# Patient Record
Sex: Female | Born: 1965 | Race: White | Hispanic: No | Marital: Married | Smoking: Never smoker
Health system: Southern US, Community
[De-identification: ages and names within clinical notes are randomized; demographics above are authoritative.]

## PROBLEM LIST (undated history)

## (undated) DIAGNOSIS — G43909 Migraine, unspecified, not intractable, without status migrainosus: Secondary | ICD-10-CM

## (undated) DIAGNOSIS — N809 Endometriosis, unspecified: Secondary | ICD-10-CM

## (undated) DIAGNOSIS — Z87898 Personal history of other specified conditions: Secondary | ICD-10-CM

## (undated) DIAGNOSIS — I5020 Unspecified systolic (congestive) heart failure: Secondary | ICD-10-CM

## (undated) DIAGNOSIS — U099 Post covid-19 condition, unspecified: Secondary | ICD-10-CM

## (undated) DIAGNOSIS — N390 Urinary tract infection, site not specified: Secondary | ICD-10-CM

## (undated) DIAGNOSIS — I480 Paroxysmal atrial fibrillation: Secondary | ICD-10-CM

## (undated) DIAGNOSIS — I447 Left bundle-branch block, unspecified: Secondary | ICD-10-CM

## (undated) DIAGNOSIS — E039 Hypothyroidism, unspecified: Secondary | ICD-10-CM

## (undated) DIAGNOSIS — Z9889 Other specified postprocedural states: Secondary | ICD-10-CM

## (undated) DIAGNOSIS — G40309 Generalized idiopathic epilepsy and epileptic syndromes, not intractable, without status epilepticus: Secondary | ICD-10-CM

## (undated) DIAGNOSIS — R112 Nausea with vomiting, unspecified: Secondary | ICD-10-CM

## (undated) DIAGNOSIS — E119 Type 2 diabetes mellitus without complications: Secondary | ICD-10-CM

## (undated) DIAGNOSIS — E669 Obesity, unspecified: Secondary | ICD-10-CM

## (undated) DIAGNOSIS — F419 Anxiety disorder, unspecified: Secondary | ICD-10-CM

## (undated) DIAGNOSIS — K5792 Diverticulitis of intestine, part unspecified, without perforation or abscess without bleeding: Secondary | ICD-10-CM

## (undated) DIAGNOSIS — C50911 Malignant neoplasm of unspecified site of right female breast: Secondary | ICD-10-CM

## (undated) DIAGNOSIS — A419 Sepsis, unspecified organism: Secondary | ICD-10-CM

## (undated) DIAGNOSIS — J449 Chronic obstructive pulmonary disease, unspecified: Secondary | ICD-10-CM

## (undated) DIAGNOSIS — J9600 Acute respiratory failure, unspecified whether with hypoxia or hypercapnia: Secondary | ICD-10-CM

## (undated) DIAGNOSIS — J45909 Unspecified asthma, uncomplicated: Secondary | ICD-10-CM

## (undated) DIAGNOSIS — G4733 Obstructive sleep apnea (adult) (pediatric): Secondary | ICD-10-CM

## (undated) DIAGNOSIS — I428 Other cardiomyopathies: Secondary | ICD-10-CM

## (undated) DIAGNOSIS — Z87442 Personal history of urinary calculi: Secondary | ICD-10-CM

## (undated) DIAGNOSIS — Z8614 Personal history of Methicillin resistant Staphylococcus aureus infection: Secondary | ICD-10-CM

## (undated) DIAGNOSIS — I219 Acute myocardial infarction, unspecified: Secondary | ICD-10-CM

## (undated) DIAGNOSIS — G2581 Restless legs syndrome: Secondary | ICD-10-CM

## (undated) DIAGNOSIS — I10 Essential (primary) hypertension: Secondary | ICD-10-CM

## (undated) DIAGNOSIS — I2510 Atherosclerotic heart disease of native coronary artery without angina pectoris: Secondary | ICD-10-CM

## (undated) DIAGNOSIS — G629 Polyneuropathy, unspecified: Secondary | ICD-10-CM

## (undated) DIAGNOSIS — R9431 Abnormal electrocardiogram [ECG] [EKG]: Secondary | ICD-10-CM

## (undated) DIAGNOSIS — I509 Heart failure, unspecified: Secondary | ICD-10-CM

## (undated) DIAGNOSIS — R569 Unspecified convulsions: Secondary | ICD-10-CM

## (undated) DIAGNOSIS — I82409 Acute embolism and thrombosis of unspecified deep veins of unspecified lower extremity: Secondary | ICD-10-CM

## (undated) DIAGNOSIS — F1190 Opioid use, unspecified, uncomplicated: Secondary | ICD-10-CM

## (undated) DIAGNOSIS — J189 Pneumonia, unspecified organism: Secondary | ICD-10-CM

## (undated) DIAGNOSIS — K219 Gastro-esophageal reflux disease without esophagitis: Secondary | ICD-10-CM

## (undated) DIAGNOSIS — R053 Chronic cough: Secondary | ICD-10-CM

## (undated) DIAGNOSIS — C169 Malignant neoplasm of stomach, unspecified: Secondary | ICD-10-CM

## (undated) DIAGNOSIS — M1990 Unspecified osteoarthritis, unspecified site: Secondary | ICD-10-CM

## (undated) DIAGNOSIS — E611 Iron deficiency: Secondary | ICD-10-CM

## (undated) DIAGNOSIS — I200 Unstable angina: Secondary | ICD-10-CM

## (undated) DIAGNOSIS — I4891 Unspecified atrial fibrillation: Secondary | ICD-10-CM

## (undated) DIAGNOSIS — E876 Hypokalemia: Secondary | ICD-10-CM

## (undated) DIAGNOSIS — C7A8 Other malignant neuroendocrine tumors: Secondary | ICD-10-CM

## (undated) DIAGNOSIS — R197 Diarrhea, unspecified: Secondary | ICD-10-CM

## (undated) DIAGNOSIS — F32A Depression, unspecified: Secondary | ICD-10-CM

## (undated) DIAGNOSIS — K9189 Other postprocedural complications and disorders of digestive system: Secondary | ICD-10-CM

## (undated) DIAGNOSIS — I519 Heart disease, unspecified: Secondary | ICD-10-CM

## (undated) DIAGNOSIS — D3A092 Benign carcinoid tumor of the stomach: Secondary | ICD-10-CM

## (undated) DIAGNOSIS — Z95810 Presence of automatic (implantable) cardiac defibrillator: Secondary | ICD-10-CM

## (undated) DIAGNOSIS — N63 Unspecified lump in unspecified breast: Secondary | ICD-10-CM

## (undated) HISTORY — PX: THYROIDECTOMY: SHX17

## (undated) HISTORY — DX: Benign carcinoid tumor of the stomach: D3A.092

## (undated) HISTORY — PX: ABDOMINAL HYSTERECTOMY: SHX81

## (undated) HISTORY — DX: Heart disease, unspecified: I51.9

## (undated) HISTORY — DX: Diverticulitis of intestine, part unspecified, without perforation or abscess without bleeding: K57.92

## (undated) HISTORY — PX: COLECTOMY: SHX59

## (undated) HISTORY — PX: CHOLECYSTECTOMY: SHX55

## (undated) HISTORY — DX: Endometriosis, unspecified: N80.9

## (undated) HISTORY — PX: OOPHORECTOMY: SHX86

## (undated) HISTORY — PX: CARDIAC DEFIBRILLATOR PLACEMENT: SHX171

## (undated) HISTORY — DX: Unspecified lump in unspecified breast: N63.0

## (undated) HISTORY — DX: Unspecified asthma, uncomplicated: J45.909

## (undated) HISTORY — DX: Personal history of Methicillin resistant Staphylococcus aureus infection: Z86.14

---

## 1988-09-05 DIAGNOSIS — N809 Endometriosis, unspecified: Secondary | ICD-10-CM

## 1988-09-05 HISTORY — DX: Endometriosis, unspecified: N80.9

## 2004-08-18 ENCOUNTER — Other Ambulatory Visit: Payer: Self-pay

## 2004-08-18 ENCOUNTER — Inpatient Hospital Stay: Payer: Self-pay | Admitting: Internal Medicine

## 2005-01-04 ENCOUNTER — Ambulatory Visit: Payer: Self-pay | Admitting: Internal Medicine

## 2005-01-05 ENCOUNTER — Ambulatory Visit: Payer: Self-pay | Admitting: Surgery

## 2005-01-08 ENCOUNTER — Inpatient Hospital Stay: Payer: Self-pay | Admitting: Surgery

## 2005-02-01 ENCOUNTER — Ambulatory Visit: Payer: Self-pay | Admitting: Surgery

## 2005-02-21 ENCOUNTER — Ambulatory Visit: Payer: Self-pay | Admitting: Surgery

## 2005-07-17 ENCOUNTER — Emergency Department: Payer: Self-pay | Admitting: Emergency Medicine

## 2005-11-03 IMAGING — CT CT CHEST W/ CM
1 of 3 series · 15 of 31 positions shown, 19 images · IV contrast (APPLIED)
Comparison: none

REASON FOR EXAM: r/o pe
COMMENTS:

PROCEDURE:     CT  - CT CHEST (FOR PE) W  - August 18, 2004  [DATE]
RESULT:
HISTORY: 38-year-old female with pleuritic chest pain and elevated D-Dimer.
TECHNIQUE: 3 mm axial CT images are obtained from the thoracic inlet through
the upper abdomen during intravenous contrast administration using the
pulmonary embolus protocol.

[Series 7: inspace · axial · 0.66mm/px · z∈[-334,-78]mm · 15 of 411 slices shown, 19 images]
[im 23/411  mediastinal]
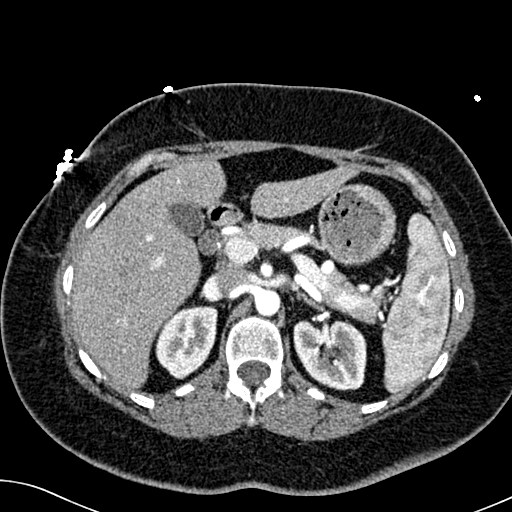
[im 23/411  lung]
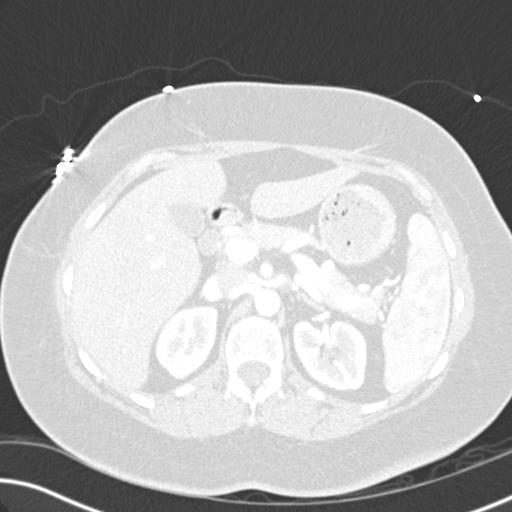
[im 46/411  lung]
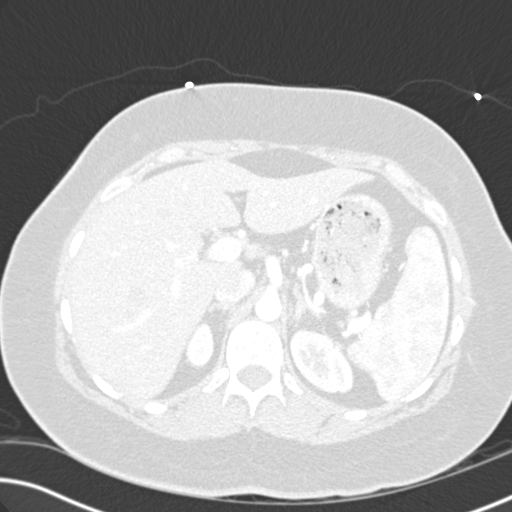
[im 92/411  lung]
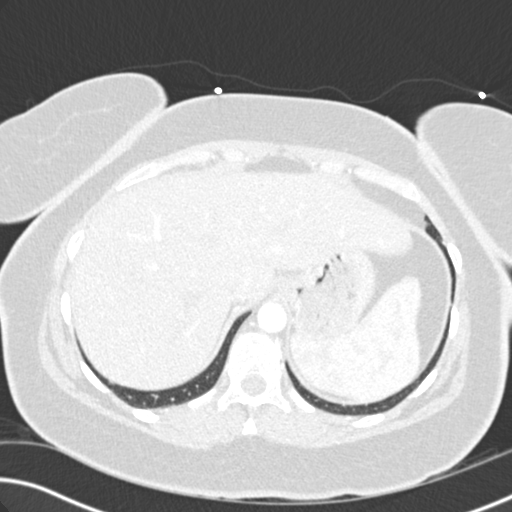
[im 114/411  lung]
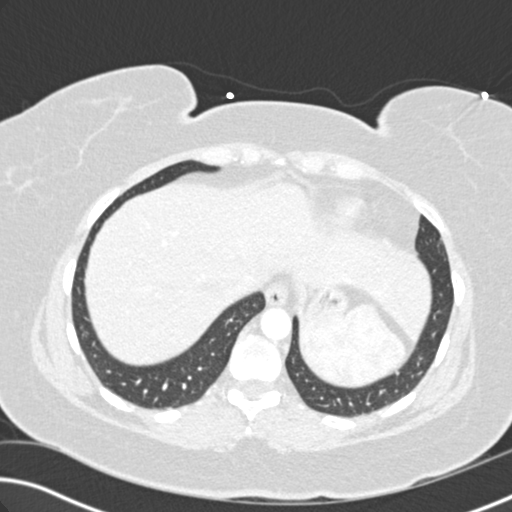
[im 137/411  mediastinal]
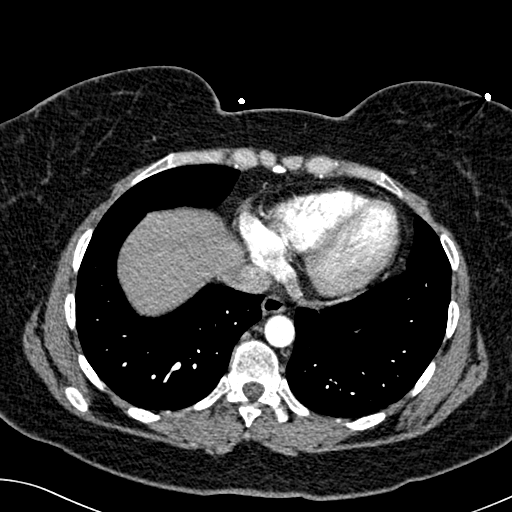
[im 137/411  lung]
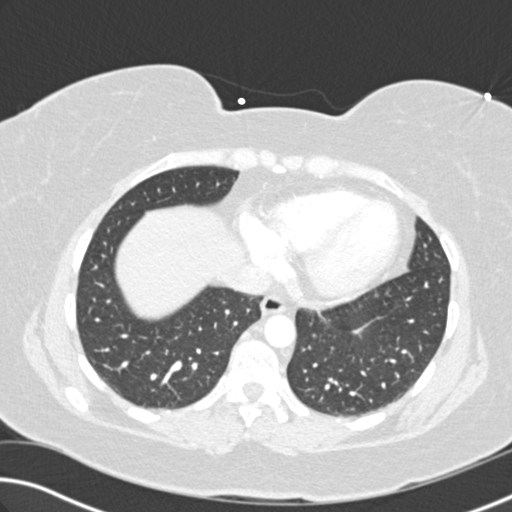
[im 160/411  lung]
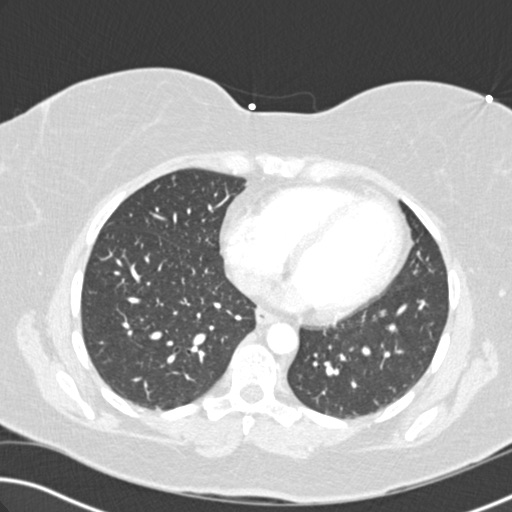
[im 183/411  lung]
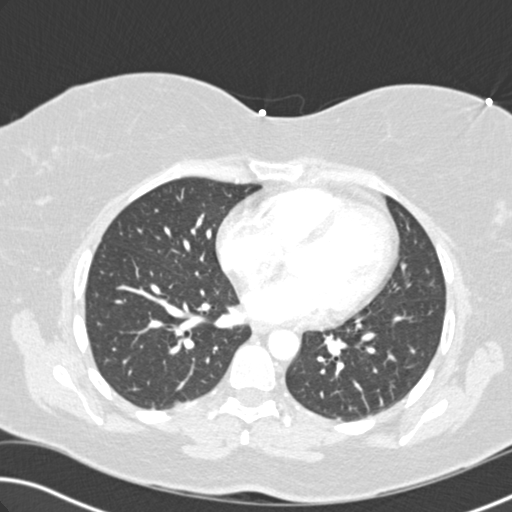
[im 206/411  lung]
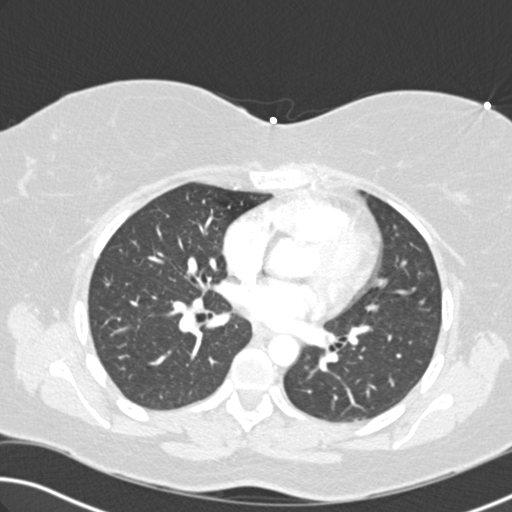
[im 228/411  mediastinal]
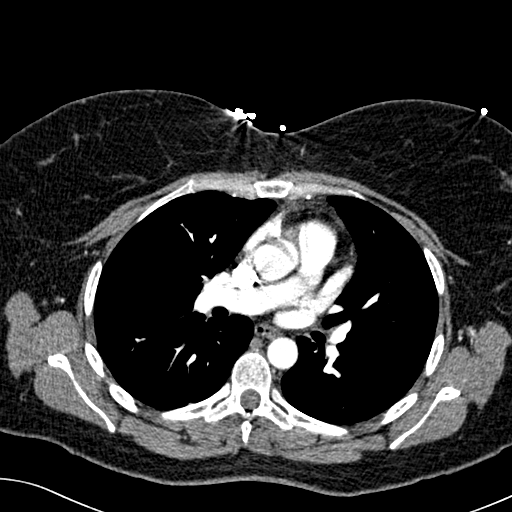
[im 228/411  lung]
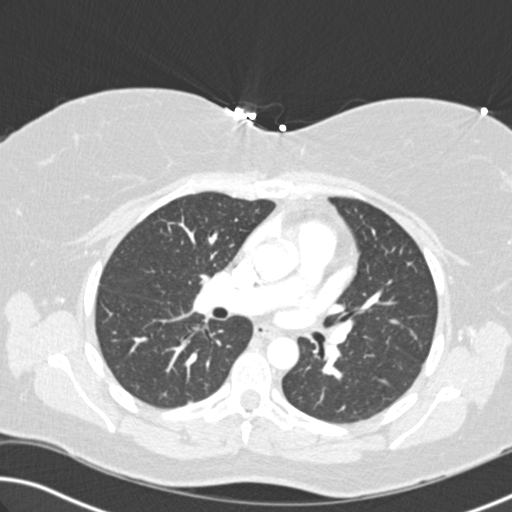
[im 251/411  lung]
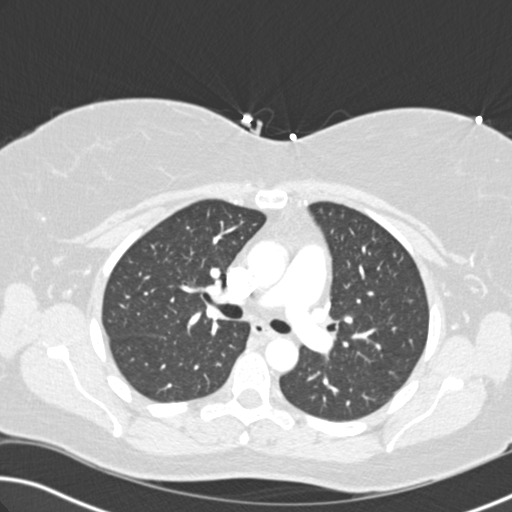
[im 274/411  lung]
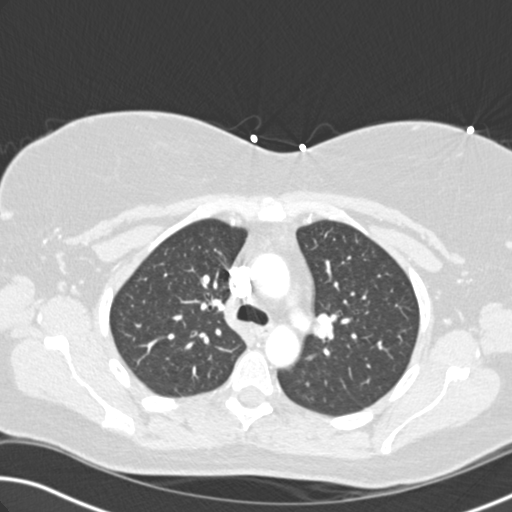
[im 297/411  lung]
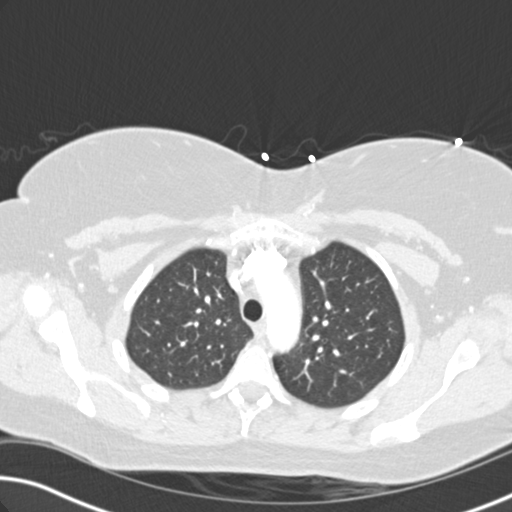
[im 342/411  mediastinal]
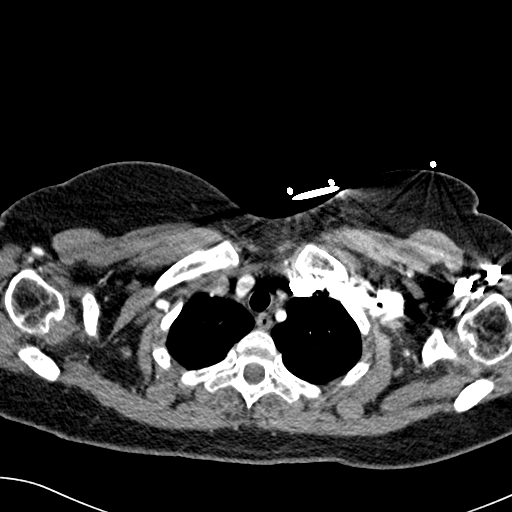
[im 342/411  lung]
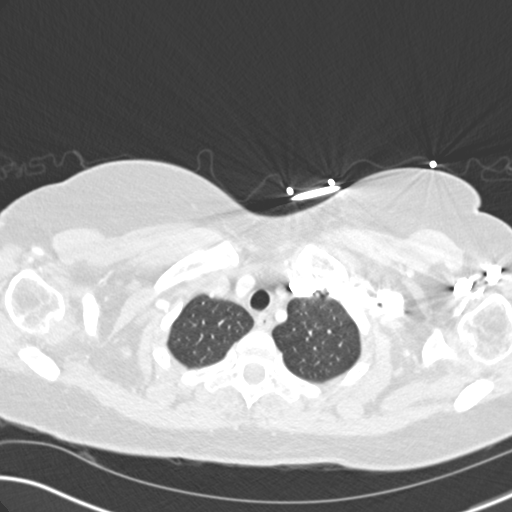
[im 365/411  lung]
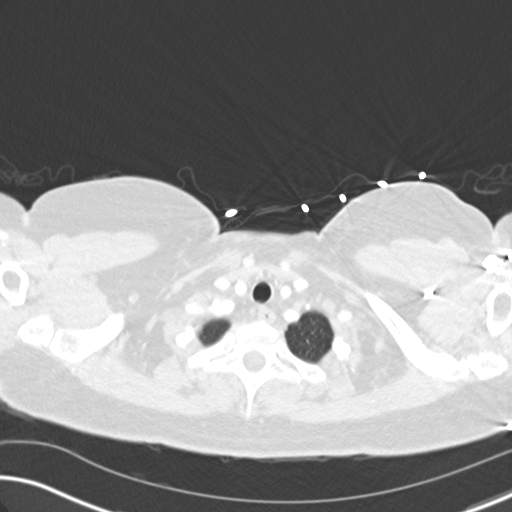
[im 388/411  lung]
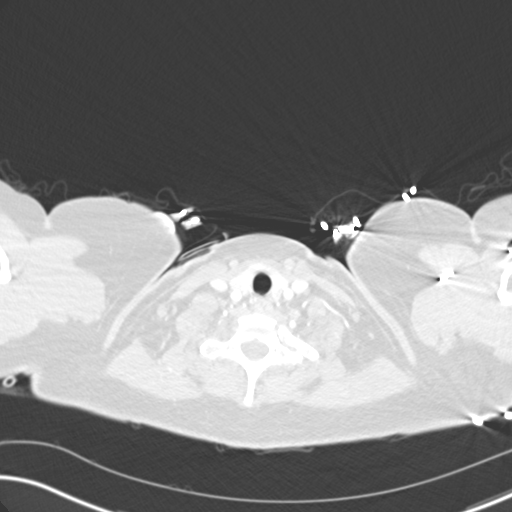

[15 of 31 positions shown; findings below may reference images not displayed]

FINDINGS; There are no prior studies immediately available for comparison.
The pulmonary arteries are well opacified.  No filling defects are seen to
suggest pulmonary embolus.  The heart and mediastinum are within normal
limits. There is no adenopathy. The lungs are clear without consolidation.
The visualized upper abdomen is within normal limits.  The bones are
unremarkable.
IMPRESSION: 1)Unremarkable CT of the Chest.  Specifically, no pulmonary embolus seen.

## 2005-11-03 IMAGING — CT CT HEAD WITHOUT CONTRAST
1 series · 16 of 27 positions shown, 20 images · non-contrast
Comparison: none

REASON FOR EXAM: severe headache
COMMENTS:

PROCEDURE:     CT  - CT HEAD WITHOUT CONTRAST  - August 18, 2004  [DATE]
RESULT:
HISTORY: 38-year-old female with headache.
TECHNIQUE: Contiguous 5 mm axial CT images were obtained from the skull base
to the vertex without intravenous contrast.
FINDINGS; The brain and CSF containing spaces are within normal limits.  No
intracranial hemorrhage or mass effect.  The visualized osseous structures
are unremarkable.

[Series 2: without · axial · non-contrast · 0.41mm/px · z∈[-164,-44]mm · 16 of 27 slices shown, 20 images]
[im 2/27  brain]
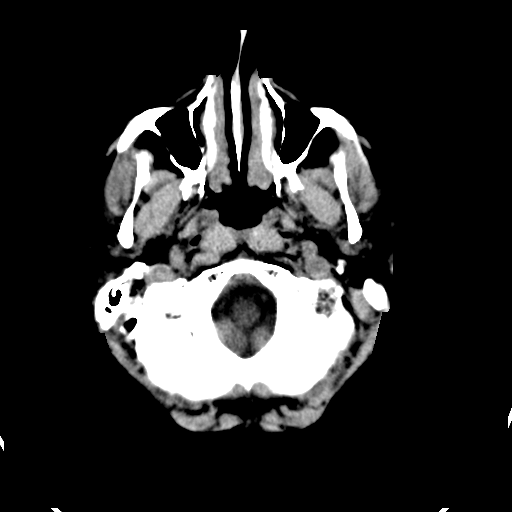
[im 2/27  bone]
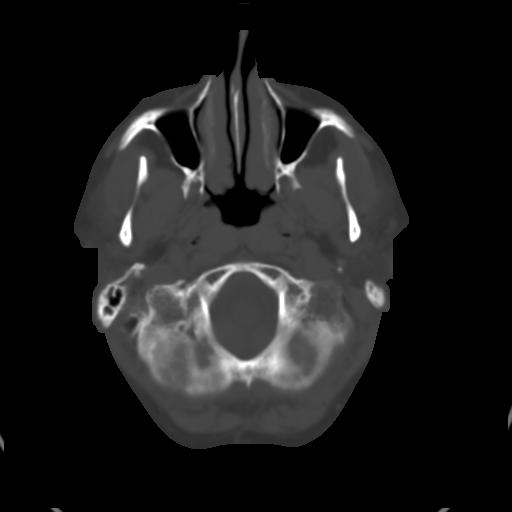
[im 4/27  brain]
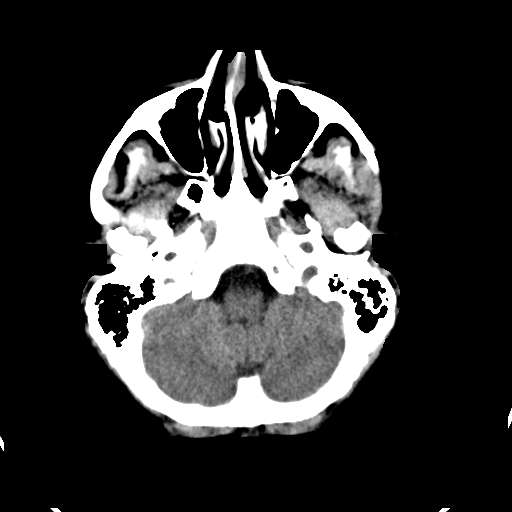
[im 5/27  brain]
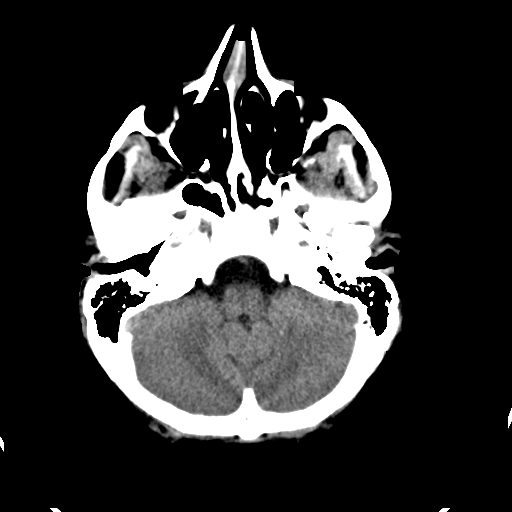
[im 7/27  brain]
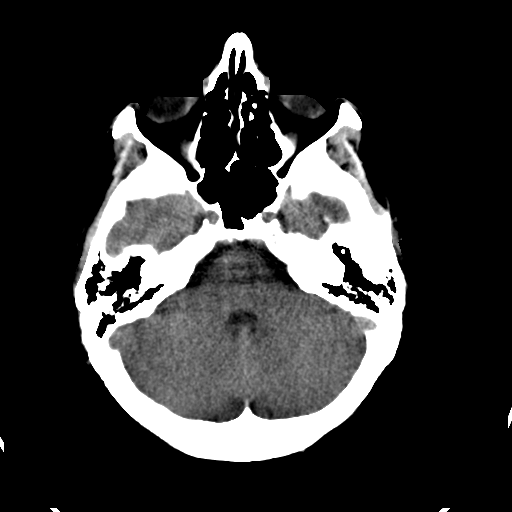
[im 9/27  brain]
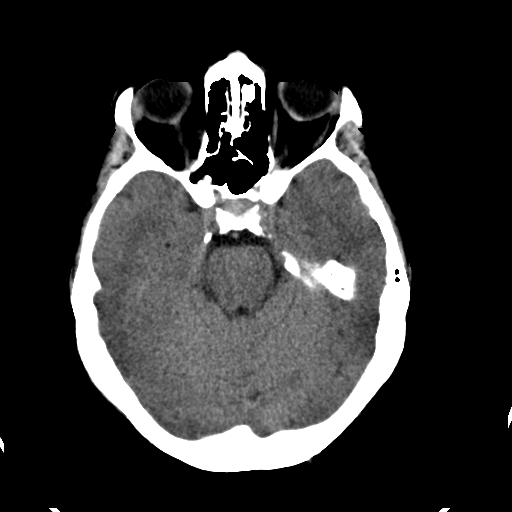
[im 9/27  bone]
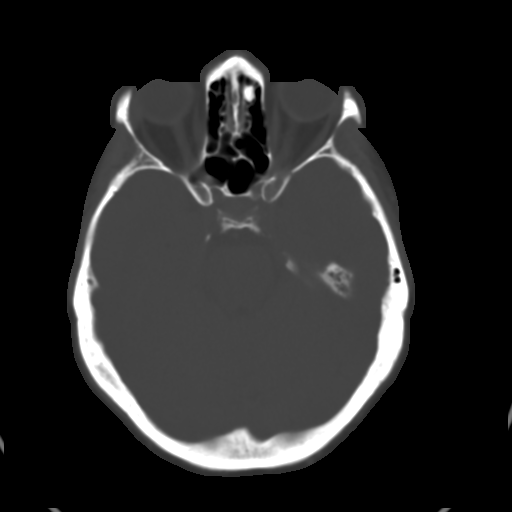
[im 10/27  brain]
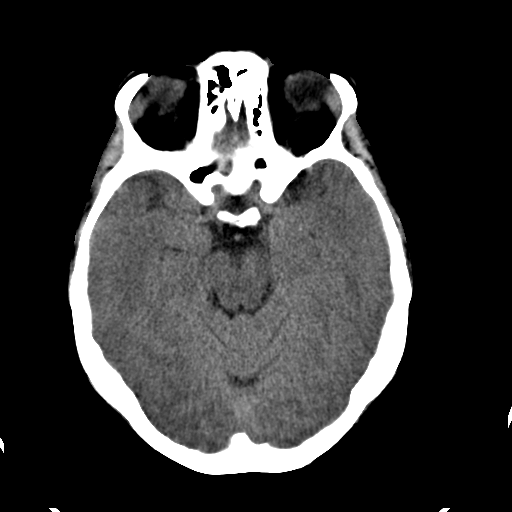
[im 12/27  brain]
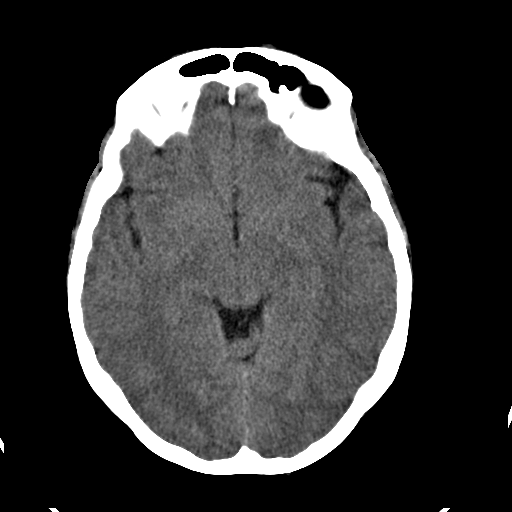
[im 13/27  brain]
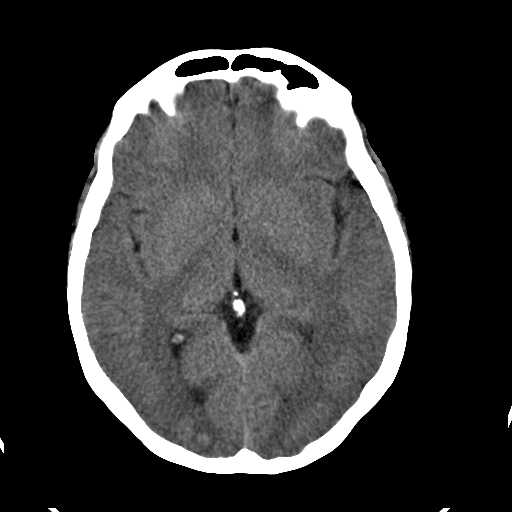
[im 15/27  brain]
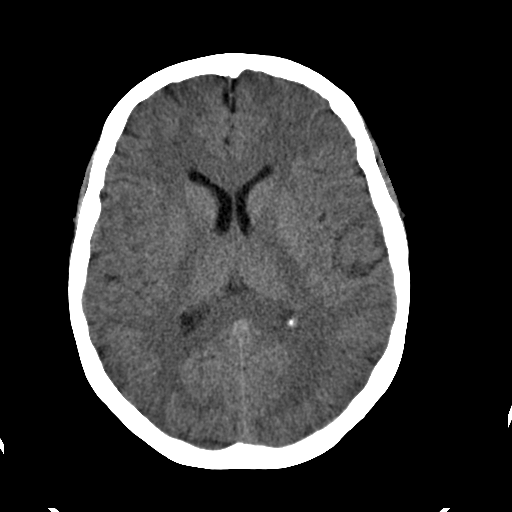
[im 15/27  bone]
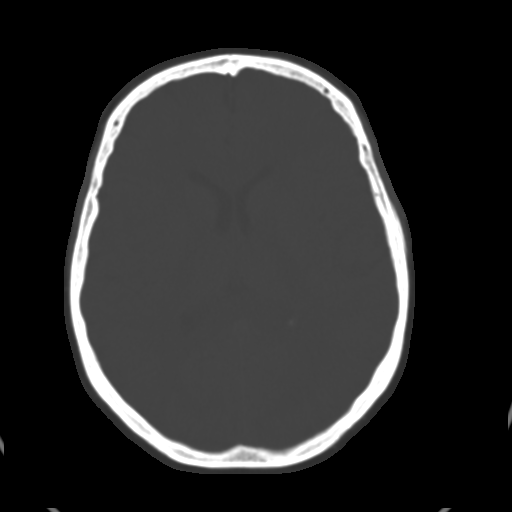
[im 16/27  brain]
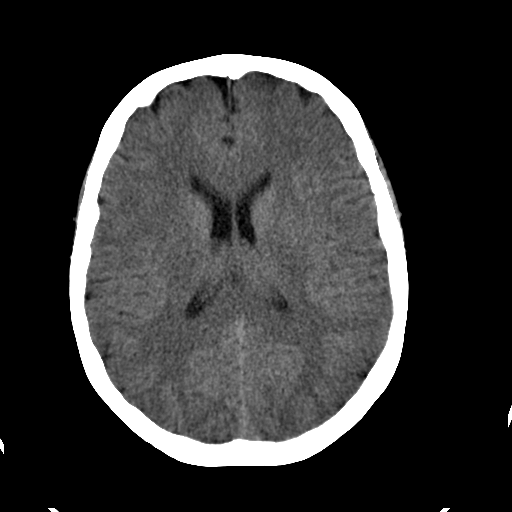
[im 18/27  brain]
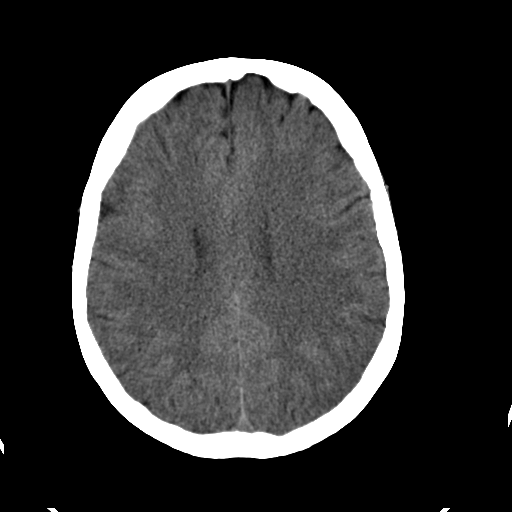
[im 19/27  brain]
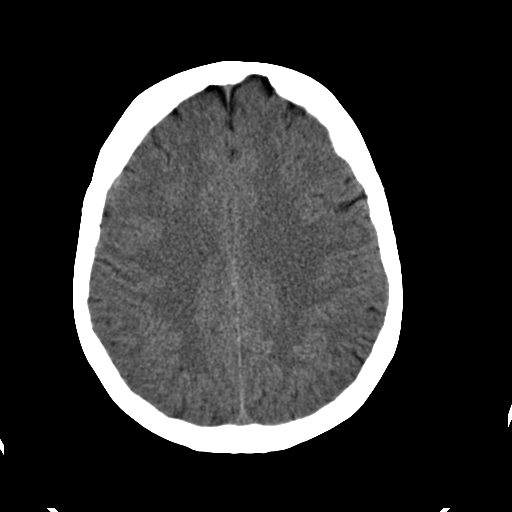
[im 21/27  brain]
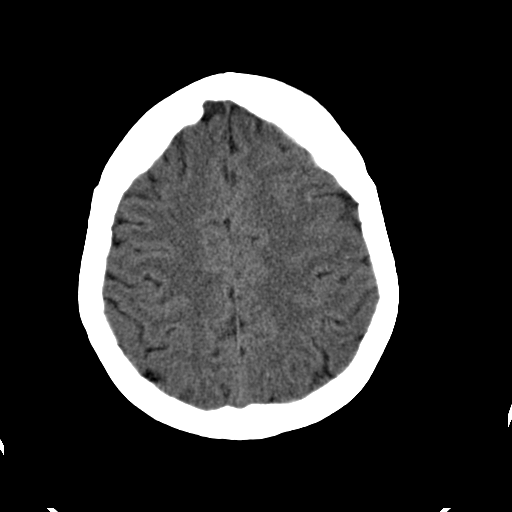
[im 21/27  bone]
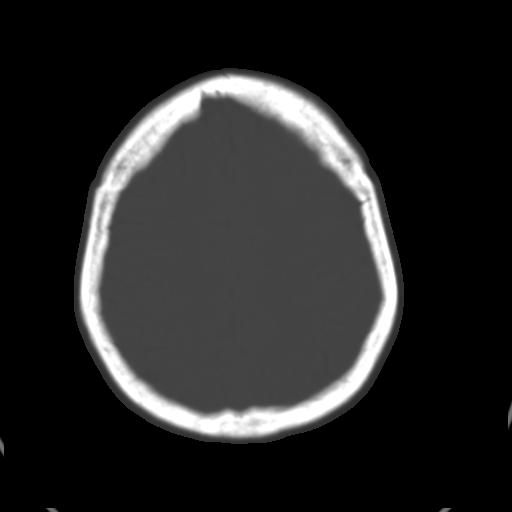
[im 23/27  brain]
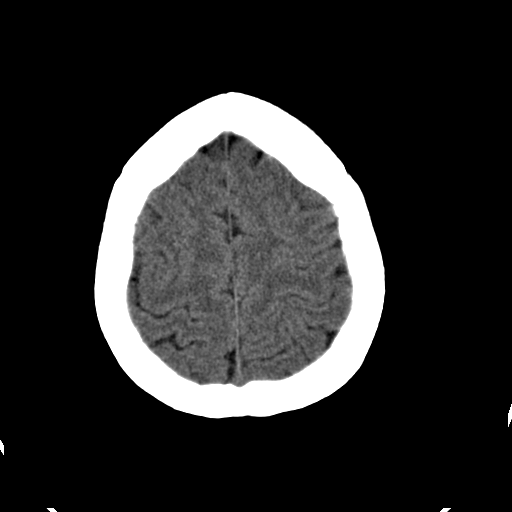
[im 24/27  brain]
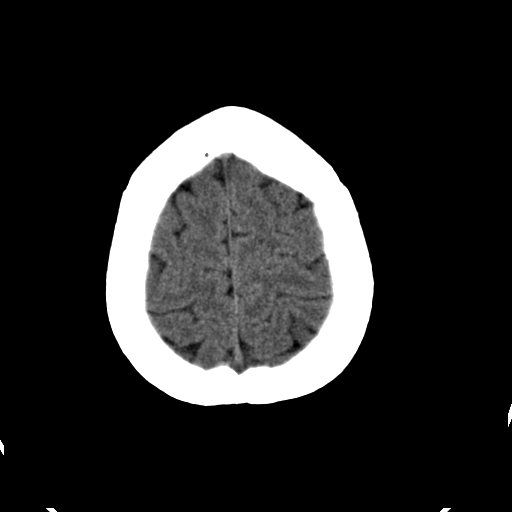
[im 26/27  brain]
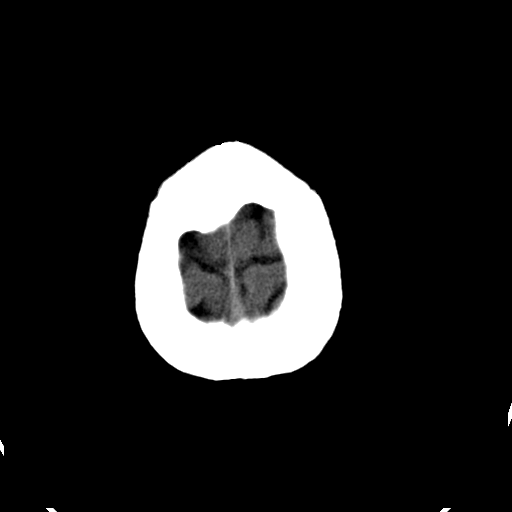

[16 of 27 positions shown; findings below may reference images not displayed]

IMPRESSION: 1)Unremarkable unenhanced Head CT.

## 2006-02-02 ENCOUNTER — Emergency Department: Payer: Self-pay | Admitting: Emergency Medicine

## 2006-03-22 IMAGING — US ABDOMEN ULTRASOUND
1 series · 14 of 25 positions shown · non-contrast
Comparison: none

REASON FOR EXAM: abd pain
COMMENTS:

[Series 1: abdomen ultrasound · 0.33mm/px · 76 acquisitions, 14 frames shown]
[im 1/76]
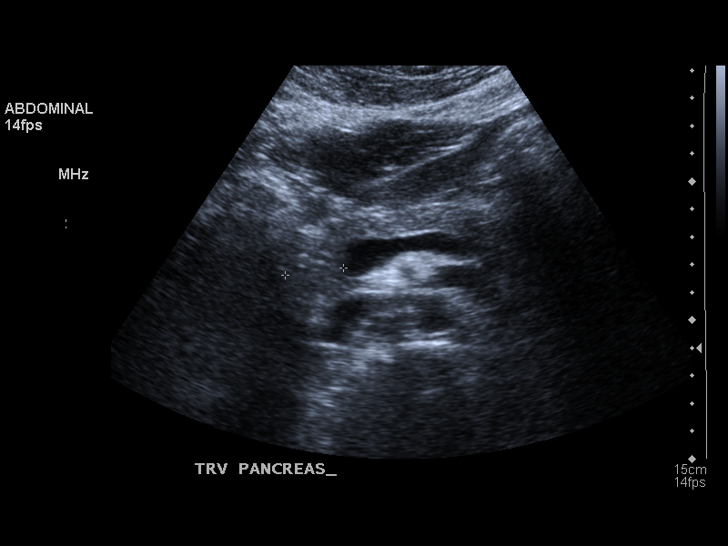
[im 7/76]
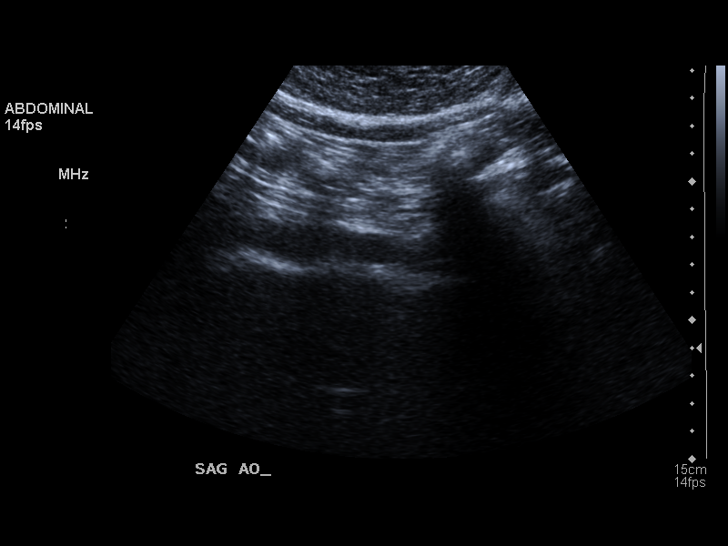
[im 13/76]
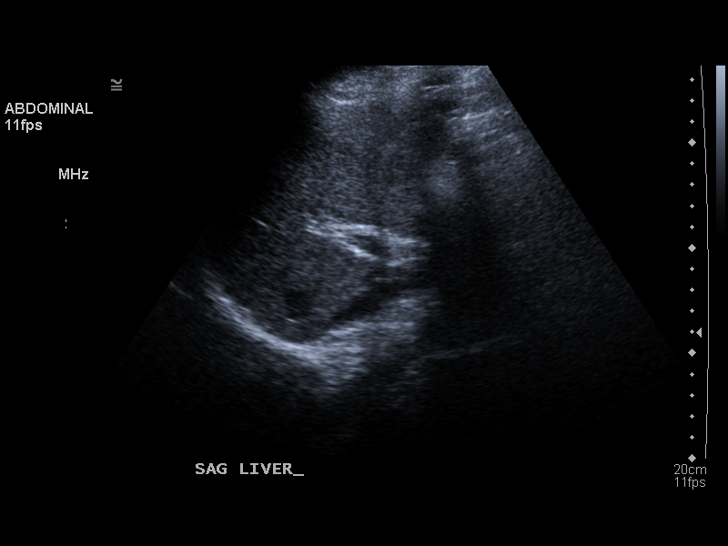
[im 19/76]
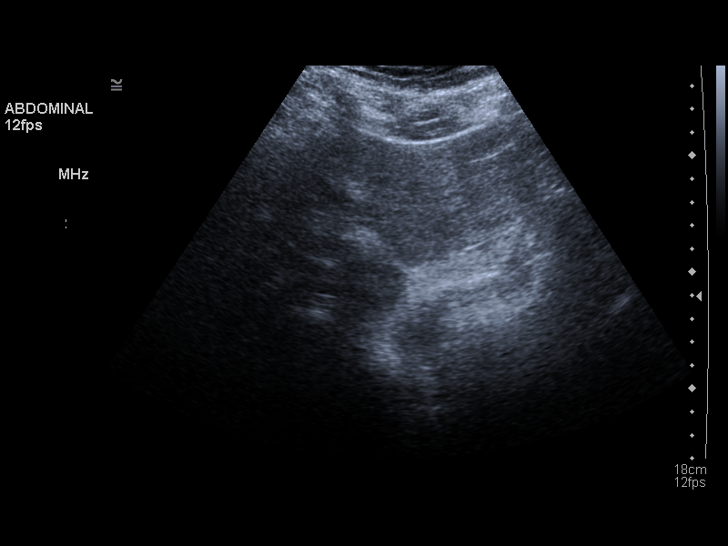
[im 26/76]
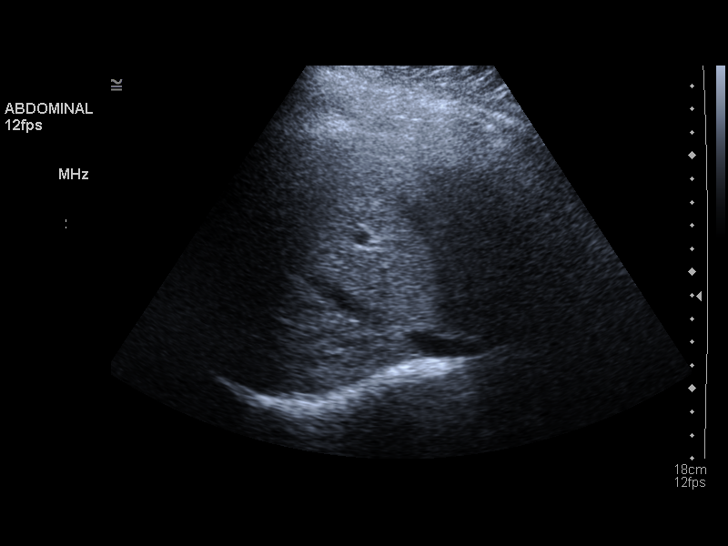
[im 29/76]
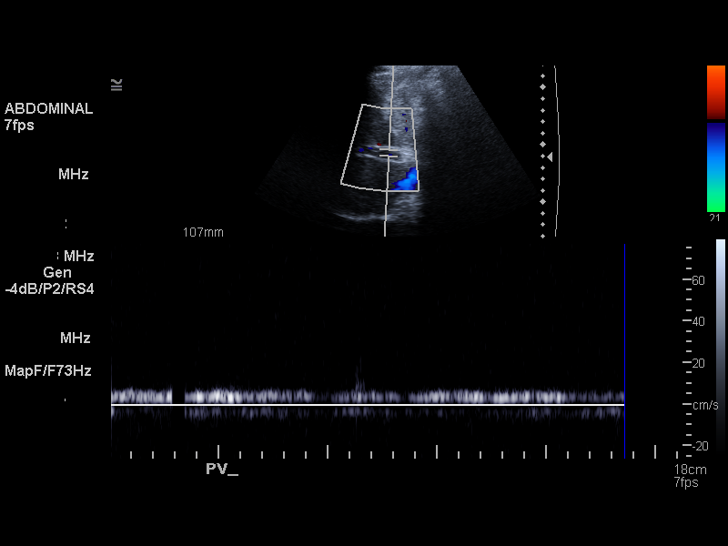
[im 35/76]
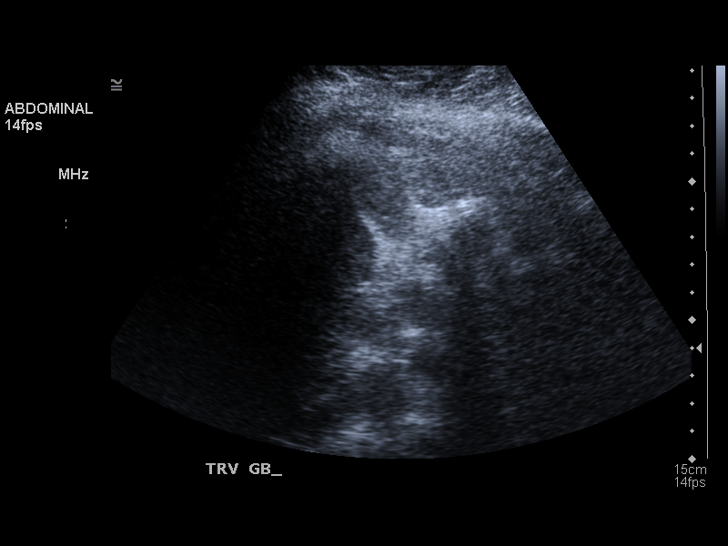
[im 41/76]
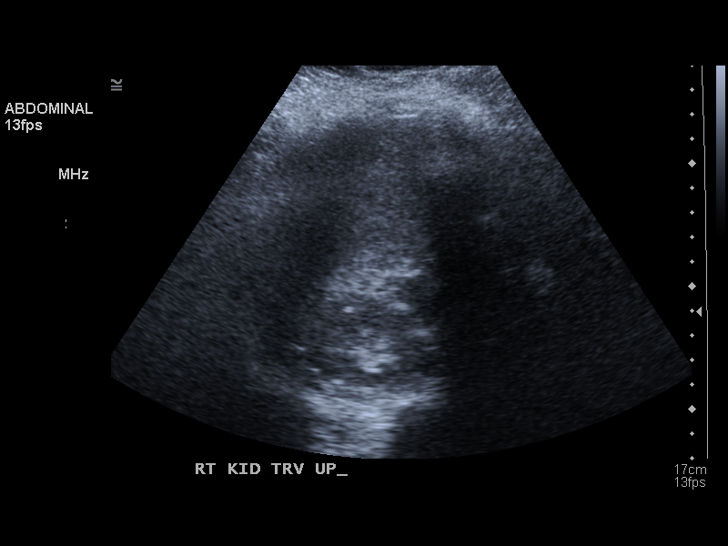
[im 47/76]
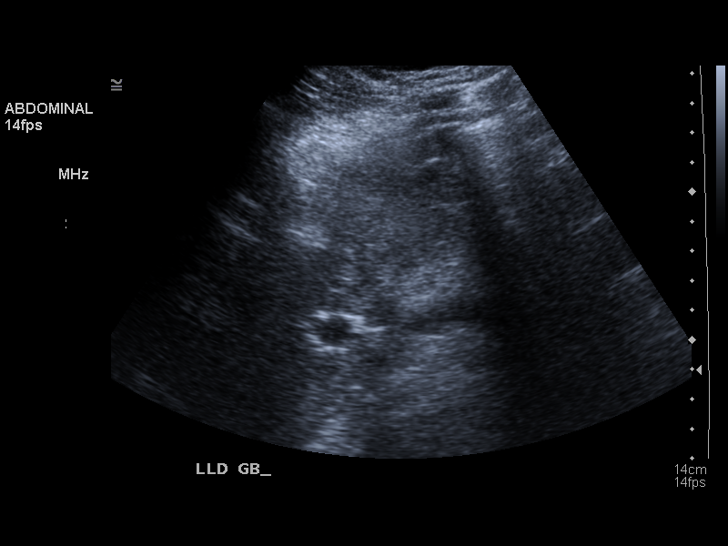
[im 51/76]
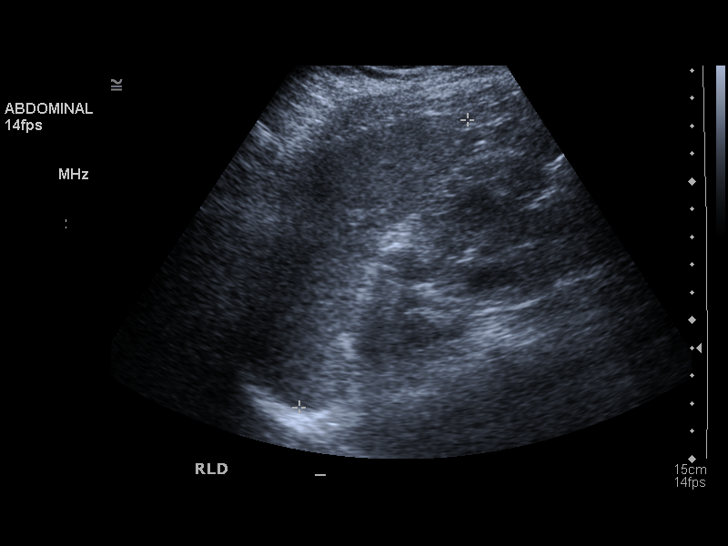
[im 57/76]
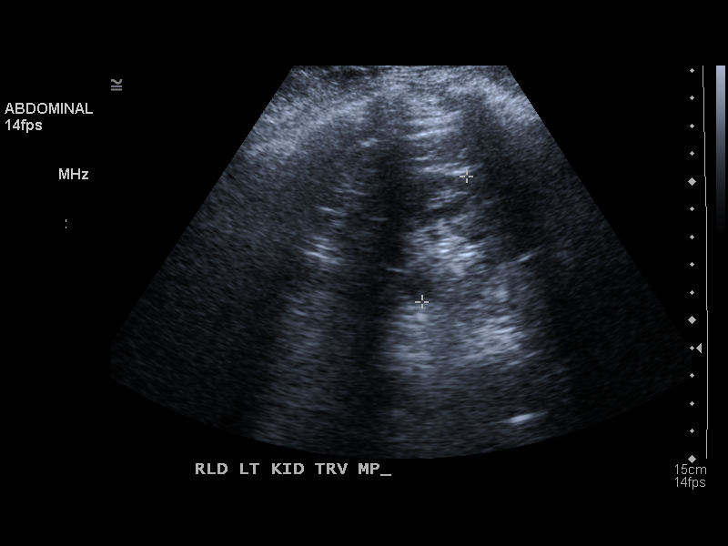
[im 63/76]
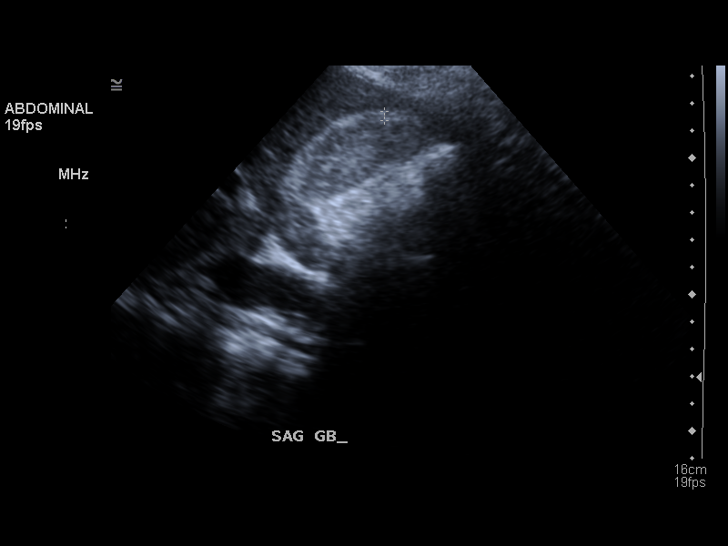
[im 69/76]
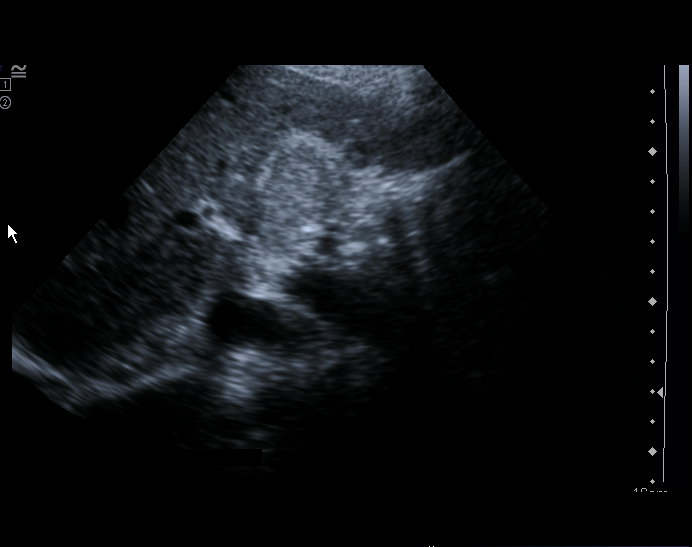
[im 76/76]
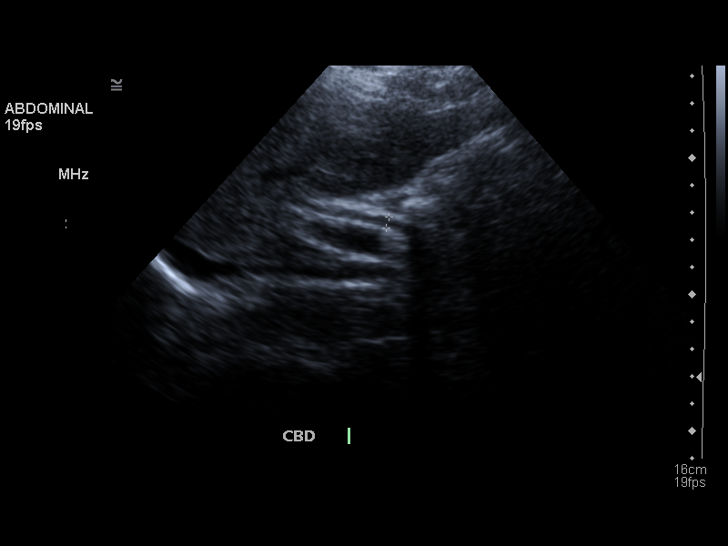

[14 of 25 positions shown; findings below may reference images not displayed]

PROCEDURE:     US  - US ABDOMEN GENERAL SURVEY  - January 04, 2005 [DATE]

RESULT:     The patient is complaining of RIGHT upper quadrant discomfort.

There is a positive sonographic Murphy sign. The gallbladder lumen appears
echogenic with there being multiple tiny echoes present which do appear to
move at times independent of motion in the remainder of the patient's body.
I do not see significant shadowing to suggest gas within the wall of the
gallbladder. The gallbladder wall is mildly thickened at 3.5 mm.  I see no
pericholecystic fluid. There does appear to be a stone in the gallbladder
neck.  The common bile duct measures 3.5 mm in diameter.  The liver,
pancreas, spleen and kidneys are normal in appearance. Survey views of the
abdominal aorta are normal and there is no evidence of ascites.
IMPRESSION: 1)There are findings consistent with acute cholecystitis and acute
cholelithiasis.  Echogenic bile/sludge is present and there does appear to
be a stone in the gallbladder neck. A positive sonographic Murphy sign is
demonstrated. There is gallbladder wall thickening.

The findings were called to Dr. Llach at the conclusion of the study.

## 2006-03-26 IMAGING — CR DG ABDOMEN 3V
1 series · 4 of 4 positions shown · non-contrast
Comparison: none

REASON FOR EXAM: s/p gallbladder surgery pain
COMMENTS:  LMP: Post Hysterectomy

[Series 1: view not recorded · 0.17mm/px · 4 of 4 slices shown]
[im 1/4]
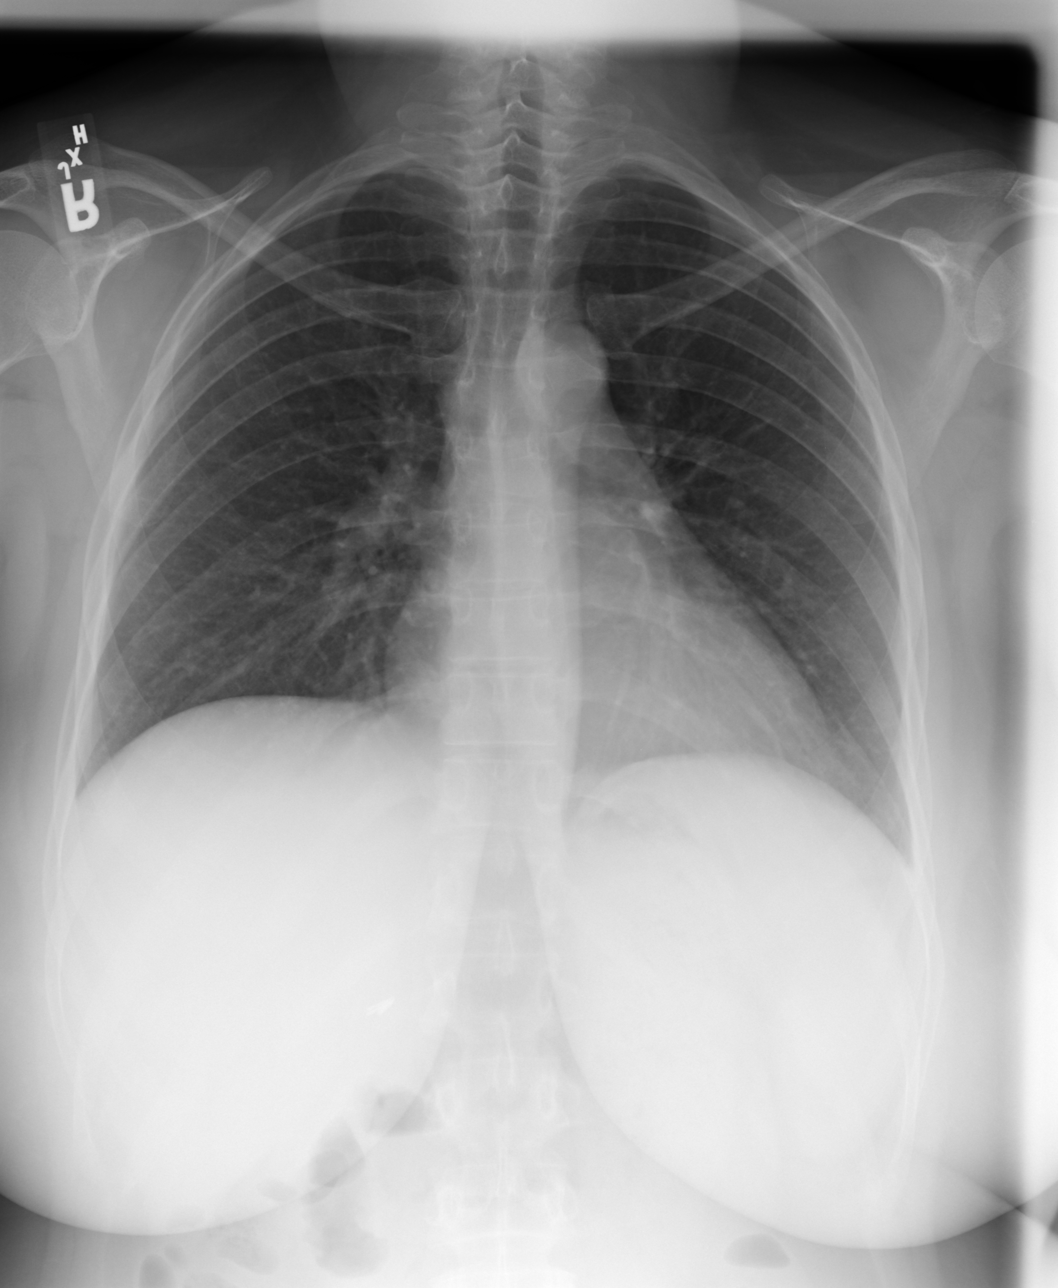
[im 2/4]
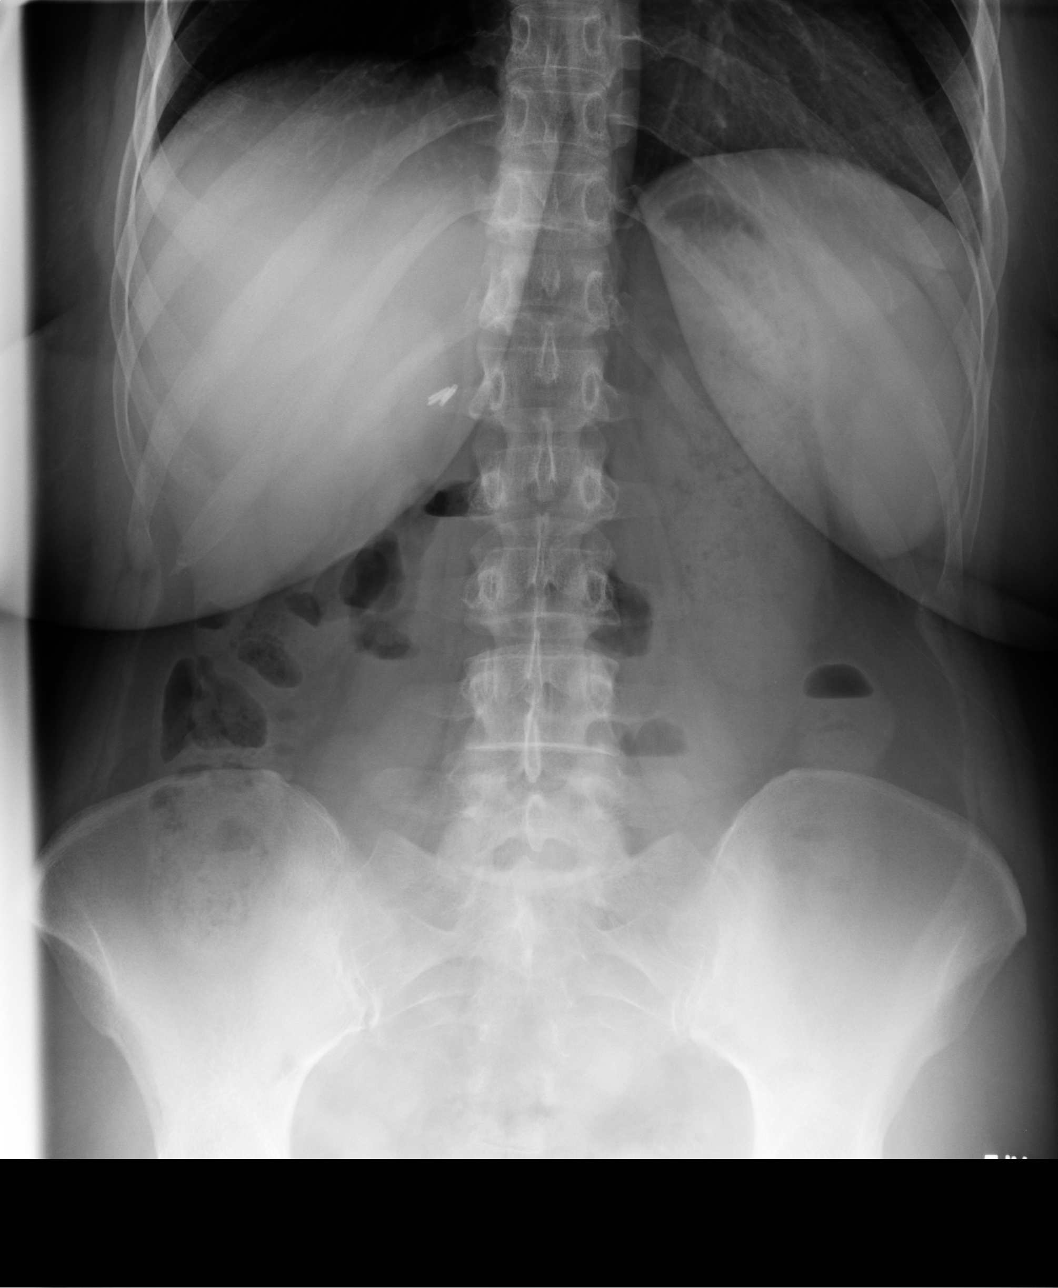
[im 3/4]
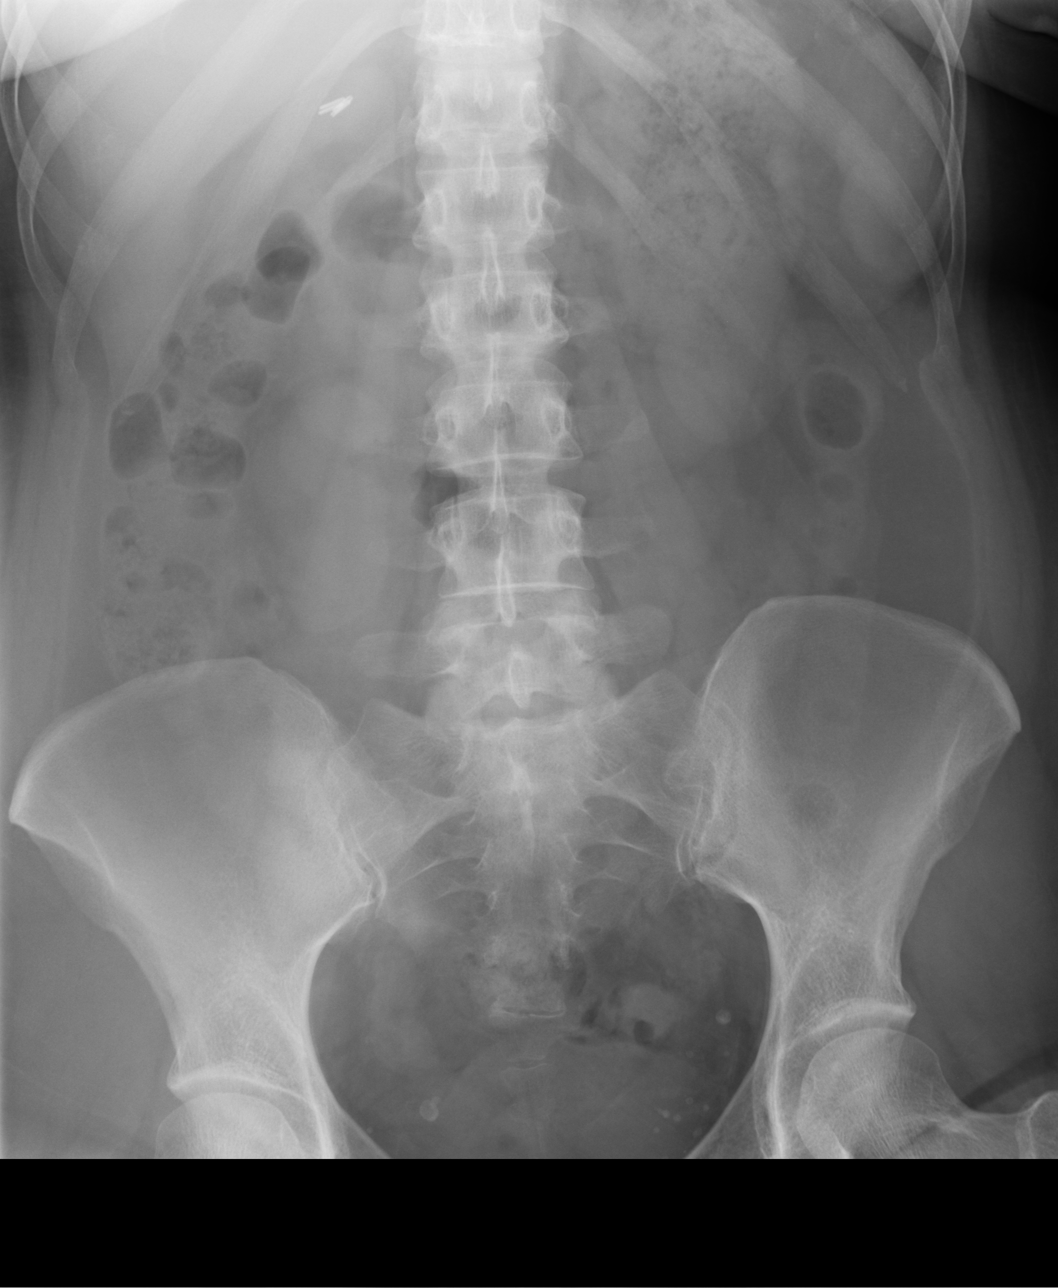
[im 4/4]
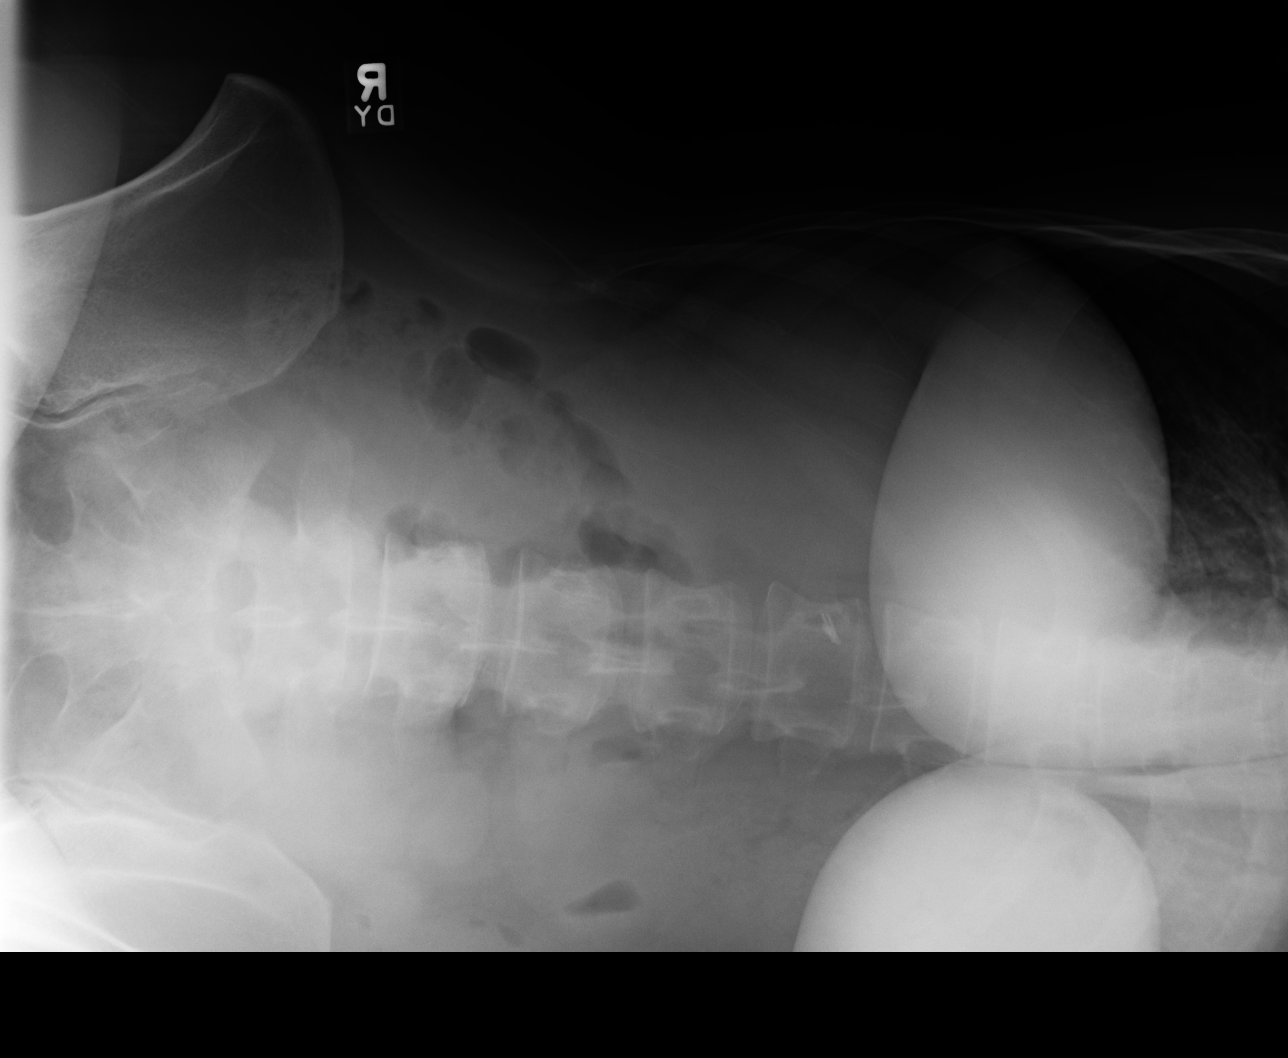

[4 of 4 positions shown; findings below may reference images not displayed]

PROCEDURE:     DXR - DXR ABDOMEN 3-WAY (INCL PA CXR)  - January 08, 2005  [DATE]

RESULT:       This study was compared to a previous study dated 08/18/04.

A single frontal view of the chest demonstrates shallow inspiration without
evidence of focal infiltrates, effusions or edema.

Air is seen within nondilated loops of large and small bowel.  A moderate to
large amount of stool is appreciated throughout the colon.  Phleboliths are
demonstrated within the pelvis.
IMPRESSION: 1.     Nonobstructive bowel gas pattern with a large amount of stool.
2.     Chest radiograph without evidence of acute cardiopulmonary disease.

## 2006-03-26 IMAGING — CT CT ABD-PELV W/ CM
1 of 2 series · 15 of 32 positions shown, 19 images · non-contrast
Comparison: none

REASON FOR EXAM: Severe abdominal pain for three days, status post lap
cholecystectomy
COMMENTS:

[Series 2: abdomen · axial · 0.67mm/px · z∈[-434,-20]mm · 15 of 91 slices shown, 19 images]
[im 4/91  soft-tissue]
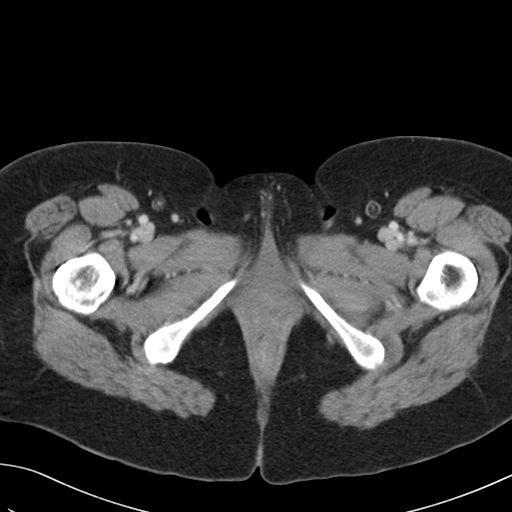
[im 4/91  bone]
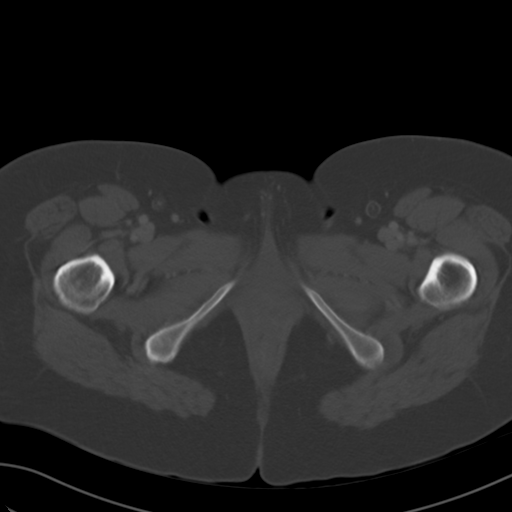
[im 12/91  soft-tissue]
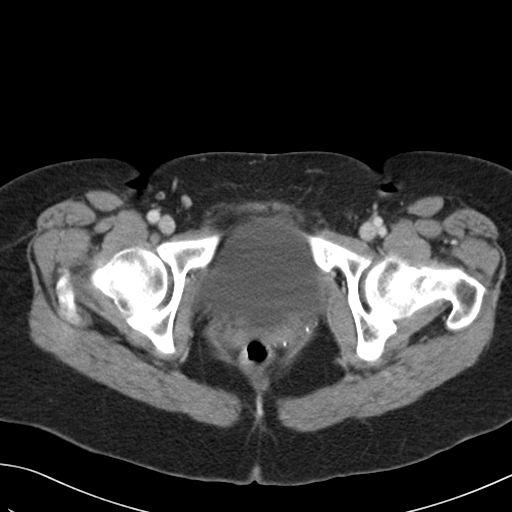
[im 19/91  soft-tissue]
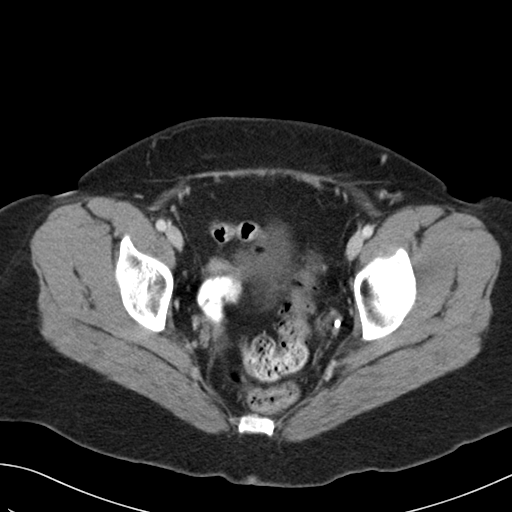
[im 27/91  soft-tissue]
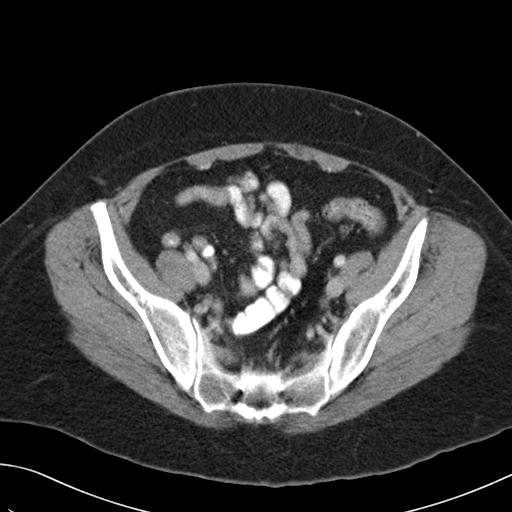
[im 31/91  soft-tissue]
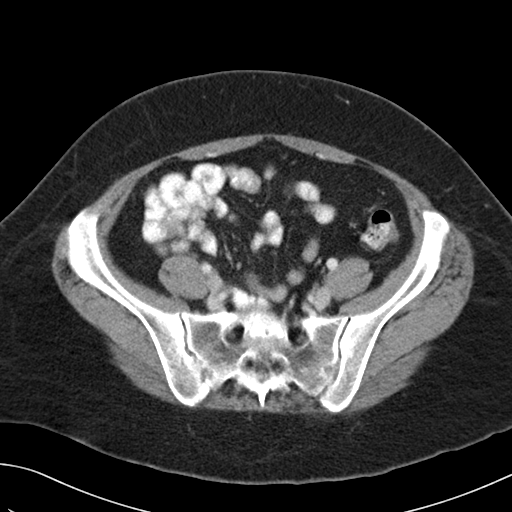
[im 38/91  soft-tissue]
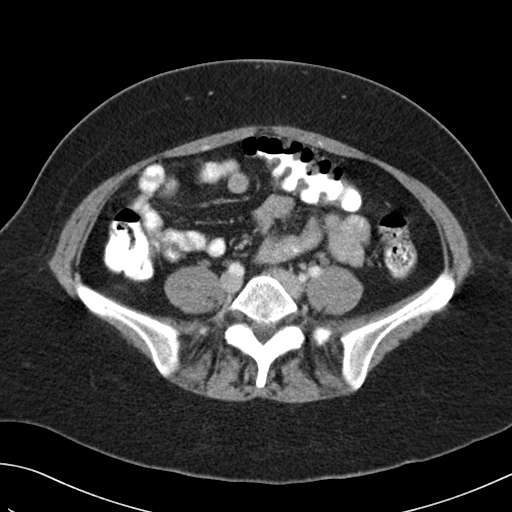
[im 46/91  soft-tissue]
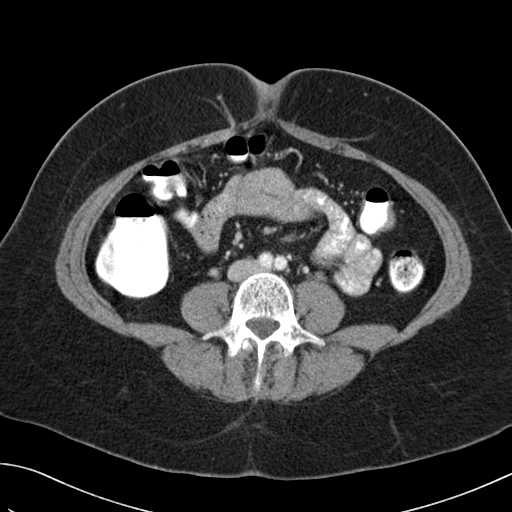
[im 53/91  soft-tissue]
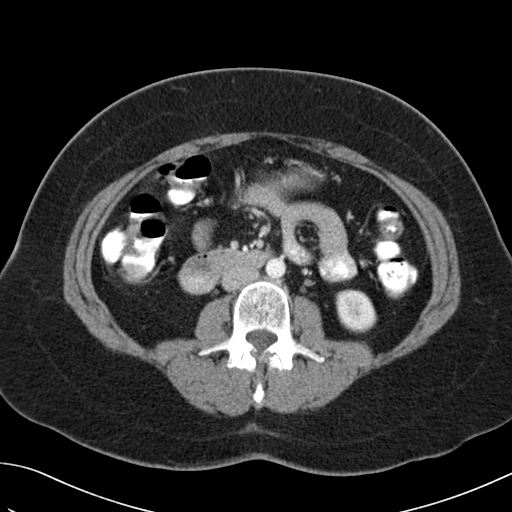
[im 61/91  soft-tissue]
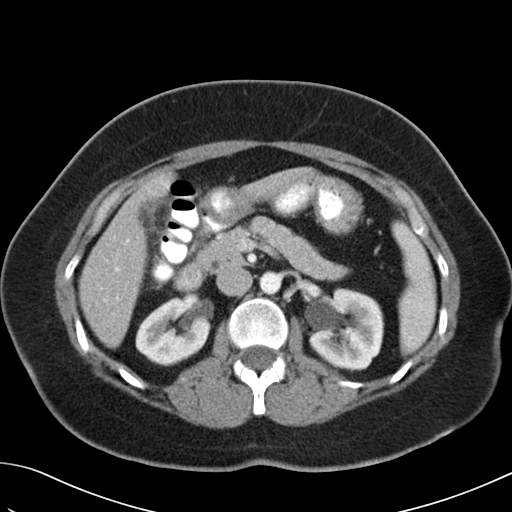
[im 61/91  bone]
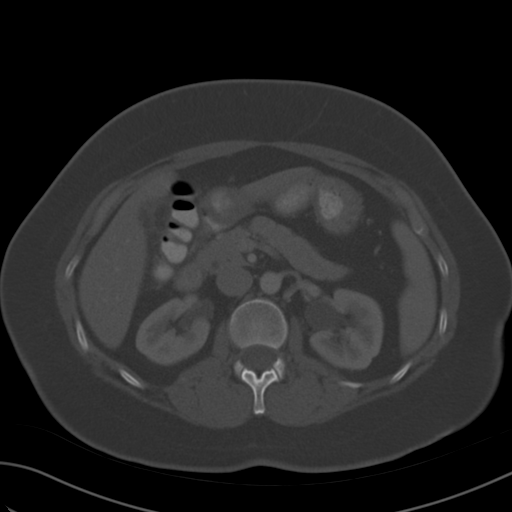
[im 64/91  soft-tissue]
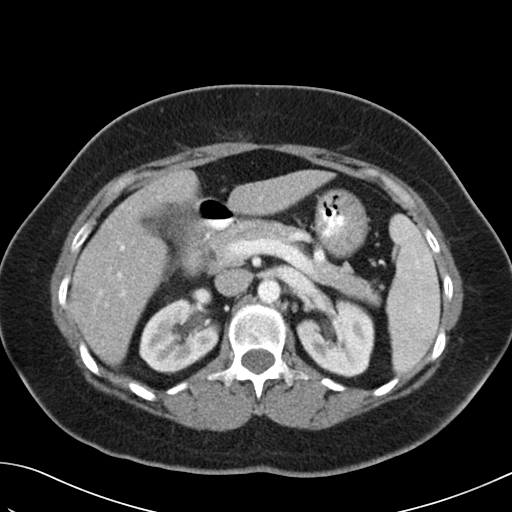
[im 72/91  soft-tissue]
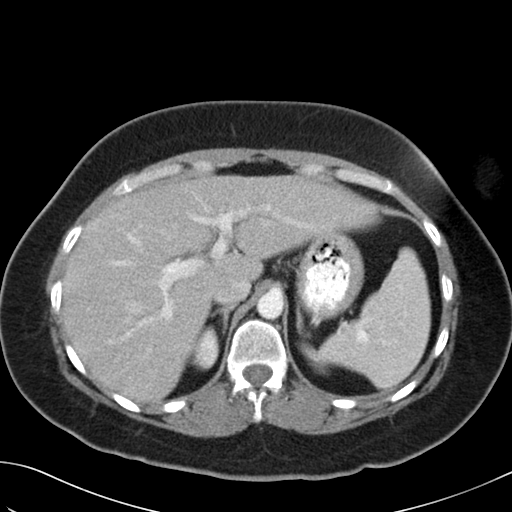
[im 76/91  lung]
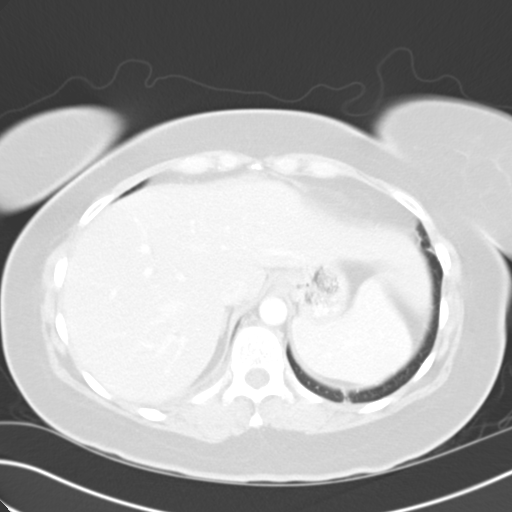
[im 79/91  soft-tissue]
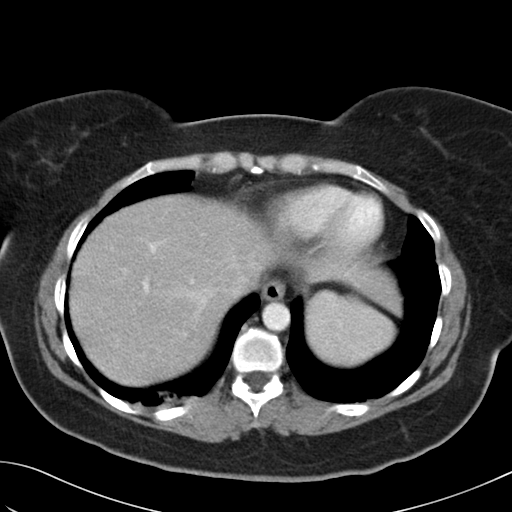
[im 79/91  lung]
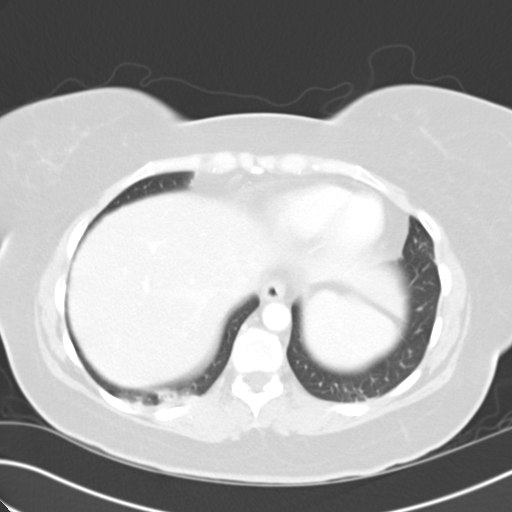
[im 83/91  lung]
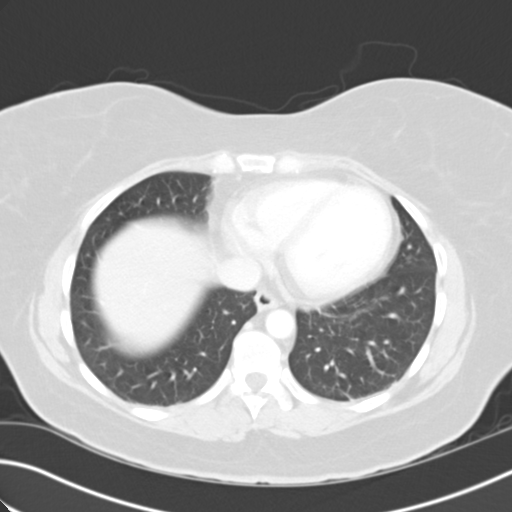
[im 87/91  soft-tissue]
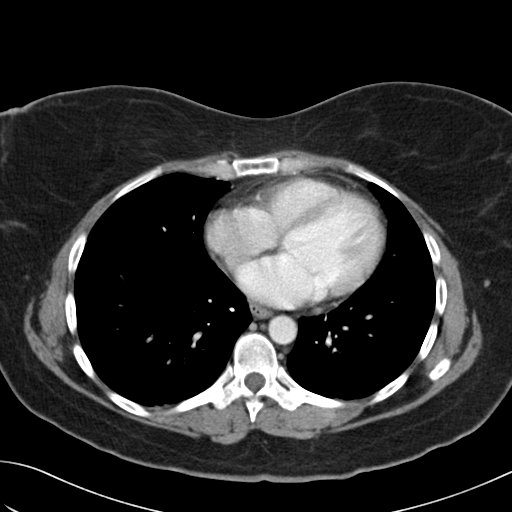
[im 87/91  lung]
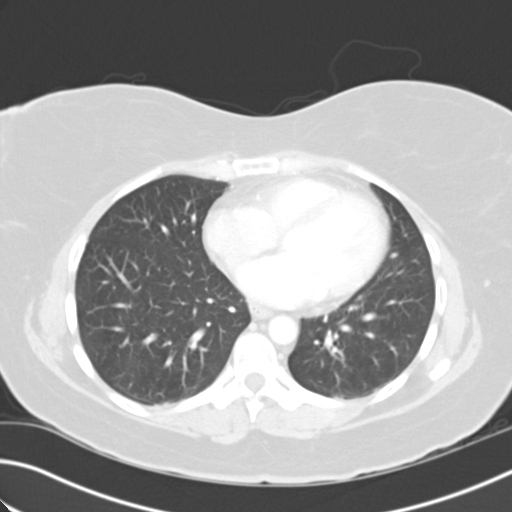

[15 of 32 positions shown; findings below may reference images not displayed]

PROCEDURE:     CT  - CT ABDOMEN / PELVIS  W  - January 08, 2005  [DATE]

RESULT:     Spiral 5.0 mm sections were obtained from the lung bases through
the pubic symphysis status post intravenous administration of 100 cc's of
Xsovue-FOX.

Evaluation of the liver demonstrates no evidence of liver masses or regions
of abnormal enhancement. Findings consistent with the patient's recent
cholecystectomy are appreciated evident by surgical clips in the gallbladder
fossa as well as a small amount of fluid and stranding indicative of mild
inflammatory change within the fat within the gallbladder fossa. The spleen,
adrenals and pancreas are unremarkable. Evaluation of the RIGHT and LEFT
kidneys demonstrates what appears to be extrarenal pelves, LEFT greater than
RIGHT. No evidence of abdominal or pelvic drainable loculated fluid or
further regions of fluid free from masses or adenopathy is appreciated.
Diverticulosis is demonstrated within the sigmoid colon which appears to be
moderate to severe. The celiac, SMA, SMV, portal vein and IMA are patent.

Evaluation of the lung bases demonstrates minimal hypoventilation within the
lung bases.
IMPRESSION: 1.     Post-surgical changes in the region of the gallbladder fossa without
evidence of drainable loculated fluid collections or significant amount of
free fluid.
2.     No further intra-abdominal abnormalities are appreciated.
3.     Diverticulosis within the sigmoid colon.
4.     These findings were discussed with Dr. Kagiso Vincent of the Surgery Service
at the time of the initial interpretation.

## 2006-05-09 IMAGING — CT CT ABDOMEN W/ CM
1 of 2 series · 15 of 32 positions shown, 19 images · non-contrast
Comparison: none

REASON FOR EXAM: abd wall mass
COMMENTS:

[Series 2: abdomen · axial · 0.63mm/px · z∈[-552,-292]mm · 15 of 58 slices shown, 19 images]
[im 3/58  soft-tissue]
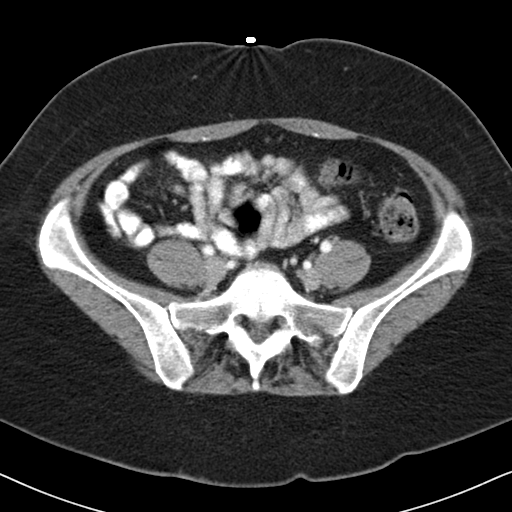
[im 3/58  bone]
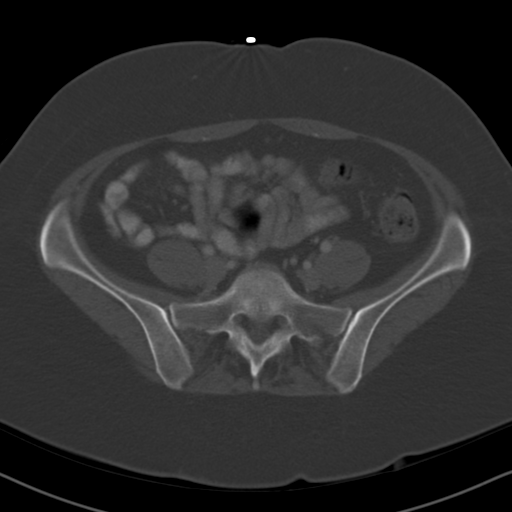
[im 7/58  soft-tissue]
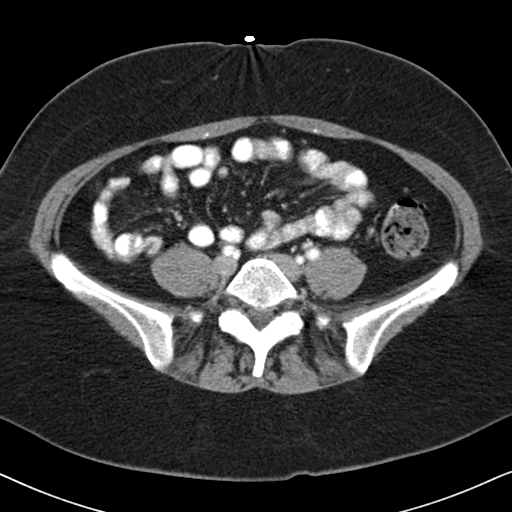
[im 11/58  soft-tissue]
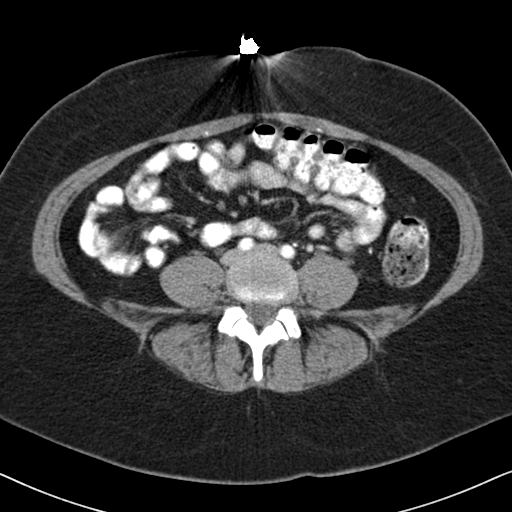
[im 16/58  soft-tissue]
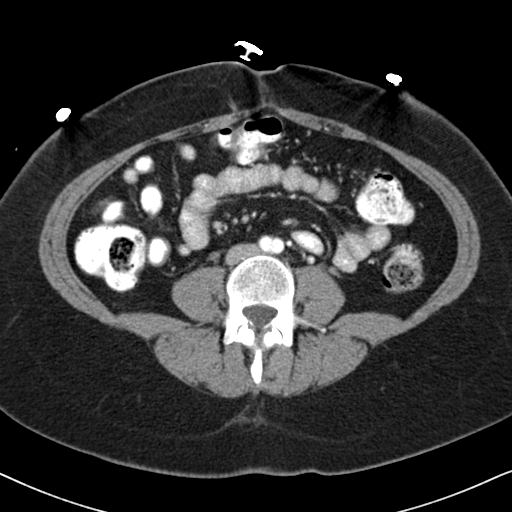
[im 20/58  soft-tissue]
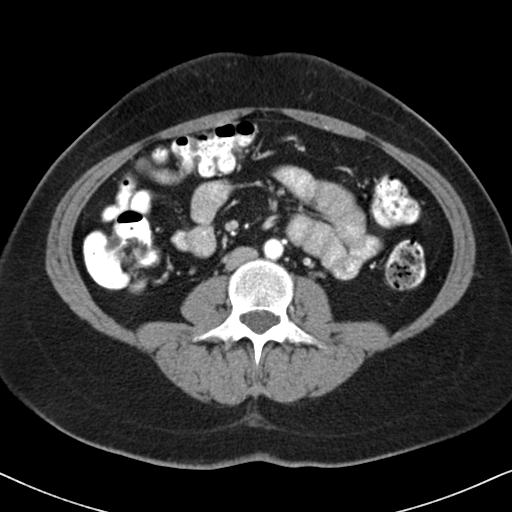
[im 25/58  soft-tissue]
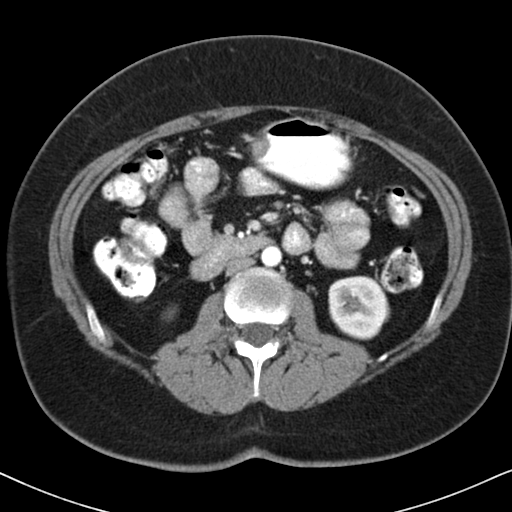
[im 29/58  soft-tissue]
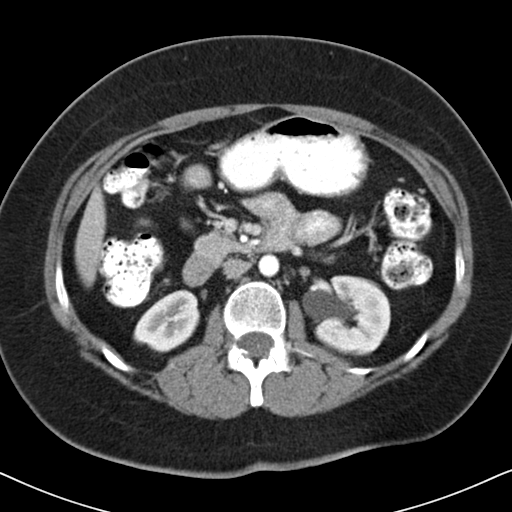
[im 33/58  soft-tissue]
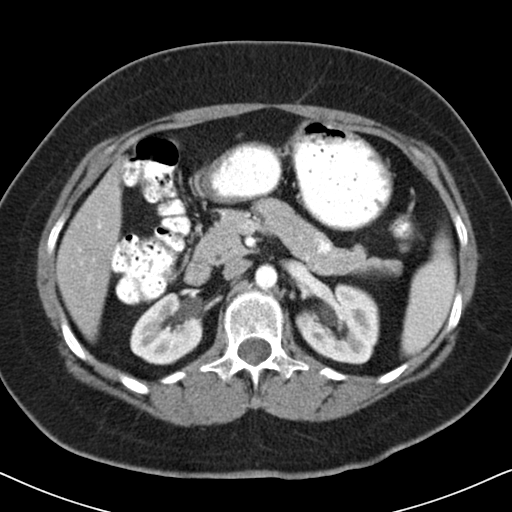
[im 38/58  soft-tissue]
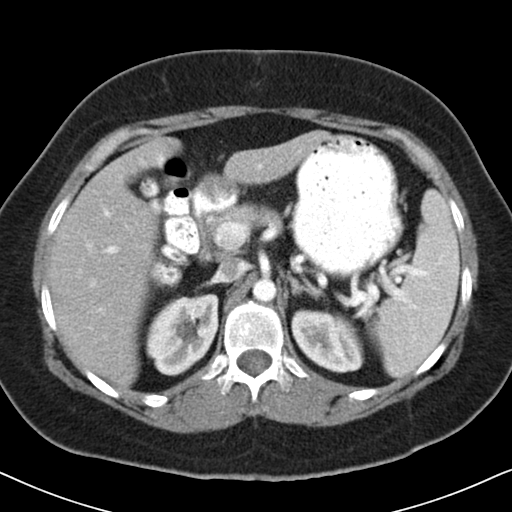
[im 38/58  bone]
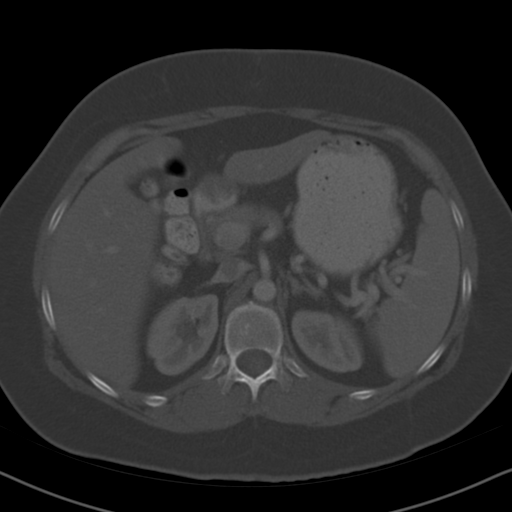
[im 42/58  soft-tissue]
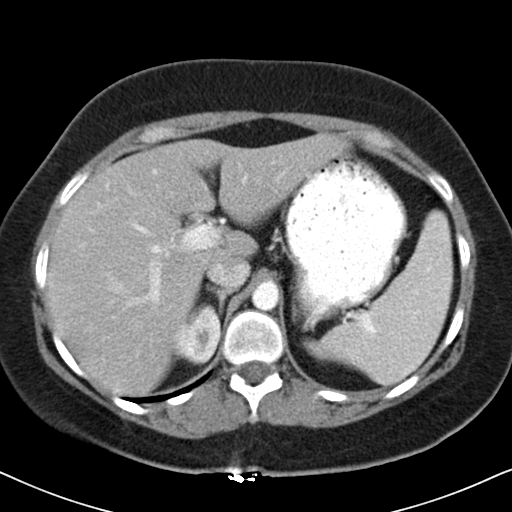
[im 47/58  soft-tissue]
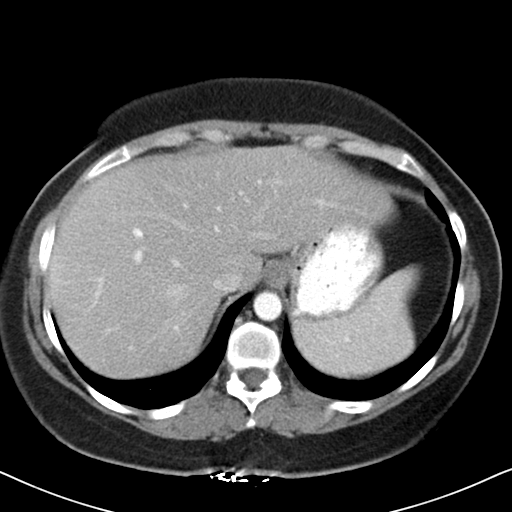
[im 49/58  lung]
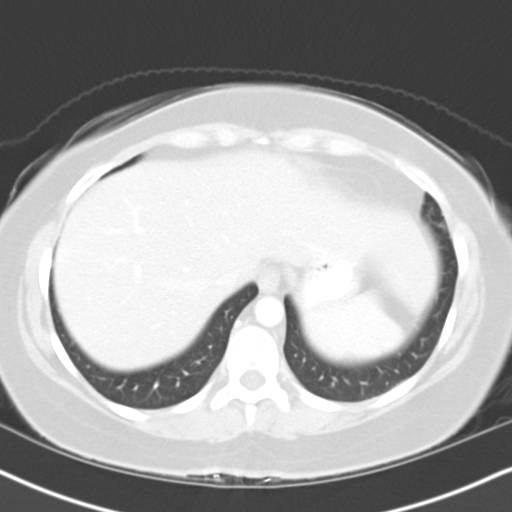
[im 51/58  soft-tissue]
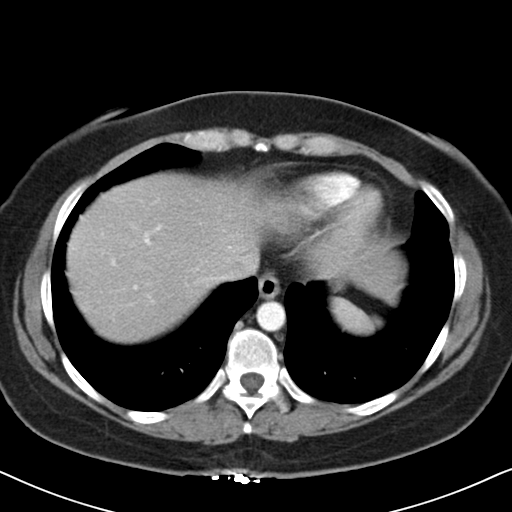
[im 51/58  lung]
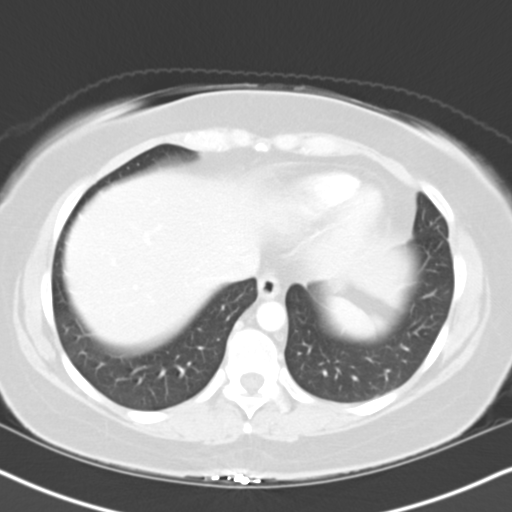
[im 53/58  lung]
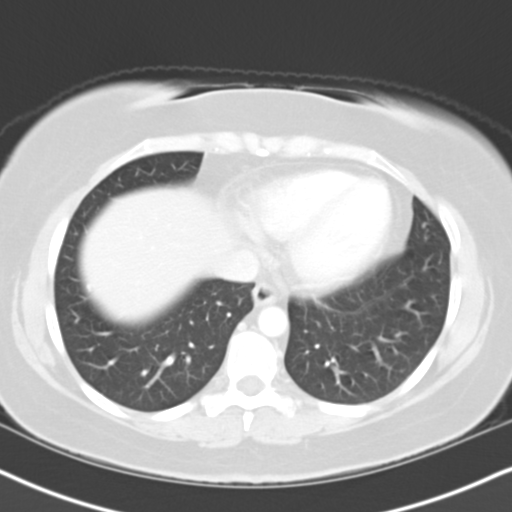
[im 55/58  soft-tissue]
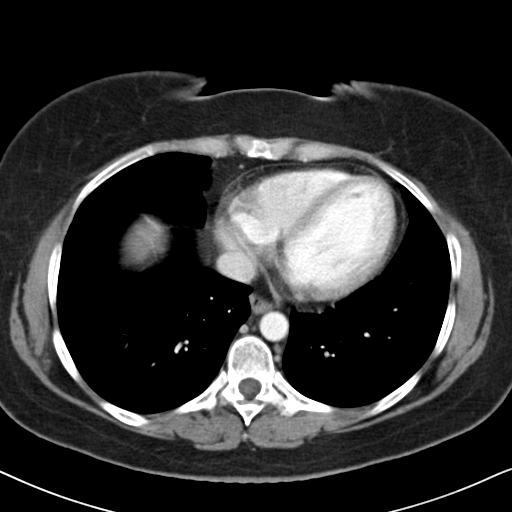
[im 55/58  lung]
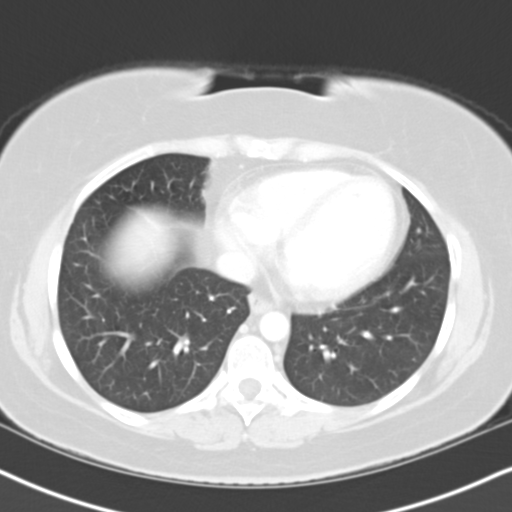

[15 of 32 positions shown; findings below may reference images not displayed]

PROCEDURE:     CT  - CT ABDOMEN STANDARD W  - February 21, 2005  [DATE]

RESULT:         5-mm helical cuts through the abdomen were performed with
oral and 100 cc of Csovue-B15 contrast.  The study is compared with a prior
examination from 01/08/05 which showed post surgical changes in the region of
the gallbladder fossa without evidence of a drainable fluid collection or
free fluid.  Diverticulosis was noted in the sigmoid colon.  Today's
examination shows the lung bases to be clear.  There is no pleural or
pericardial effusions present.  No suspicious mass or nodule is identified
in either lung base.  The liver, spleen, stomach, pancreas, adrenals and
kidneys are unremarkable.   The gallbladder has been previously removed.  No
free intraperitoneal fluid, air or adenopathy is identified.  I do not see
evidence of a ventral hernia or inflammatory changes in the mesentery.  The
bony structures are normal.  I do not see evidence of a suspicious
subcutaneous mass or asymmetry in the appearance of the abdominal
musculature.  When compared to the previous examination of 01/08/05, there has
been little significant change.
IMPRESSION: 1.     Status post cholecystectomy without evidence of a suspicious
inflammatory process in the surgical bed. No drainable fluid collections are
identified.
2.     No free intraperitoneal fluid, air, adenopathy or suspicious solid
organ abnormality.
3.     No change since 01/08/05.

## 2006-08-26 ENCOUNTER — Emergency Department: Payer: Self-pay | Admitting: Internal Medicine

## 2007-01-21 ENCOUNTER — Emergency Department: Payer: Self-pay | Admitting: Emergency Medicine

## 2007-04-20 IMAGING — CR DG ANKLE COMPLETE 3+V*L*
1 series · 5 of 5 positions shown · non-contrast
Comparison: none

REASON FOR EXAM: INJURY
COMMENTS:

PROCEDURE:     DXR - DXR ANKLE LEFT COMPLETE  - February 02, 2006  [DATE]
RESULT:       Five views of the LEFT ankle were obtained.  No fracture,
dislocation or other acute bony abnormality is identified.  The ankle
mortise is well maintained.

[Series 1: view not recorded · 0.17mm/px · 5 of 5 slices shown]
[im 1/5]
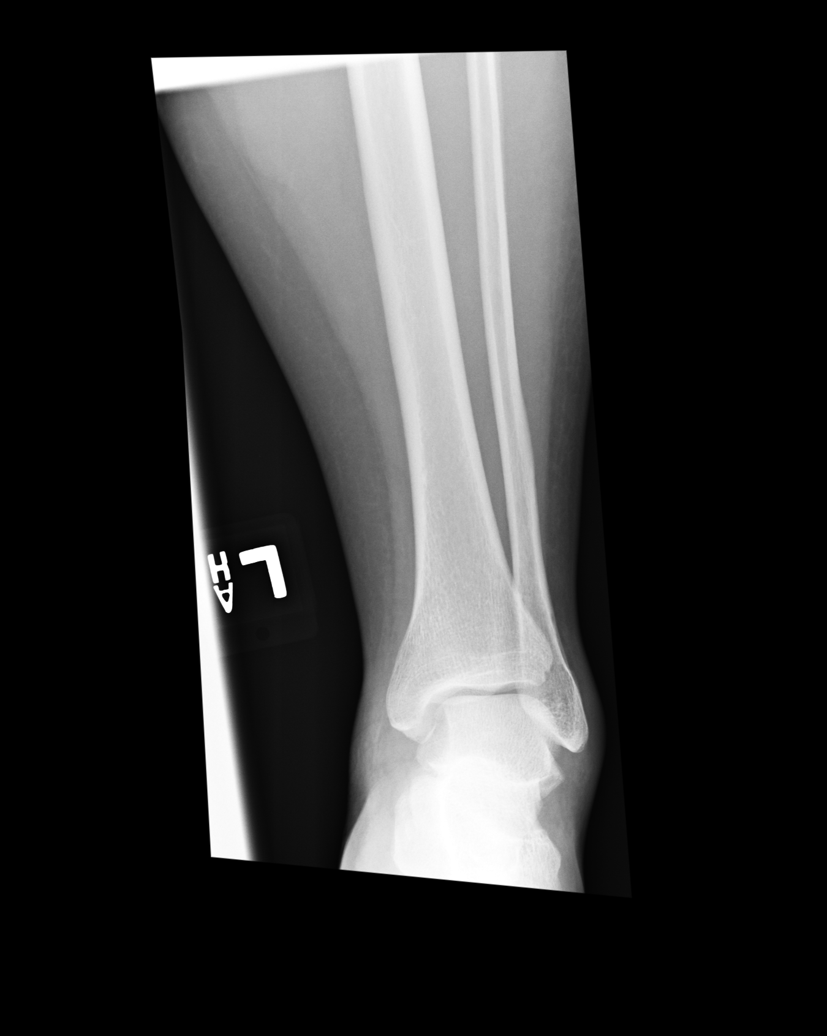
[im 2/5]
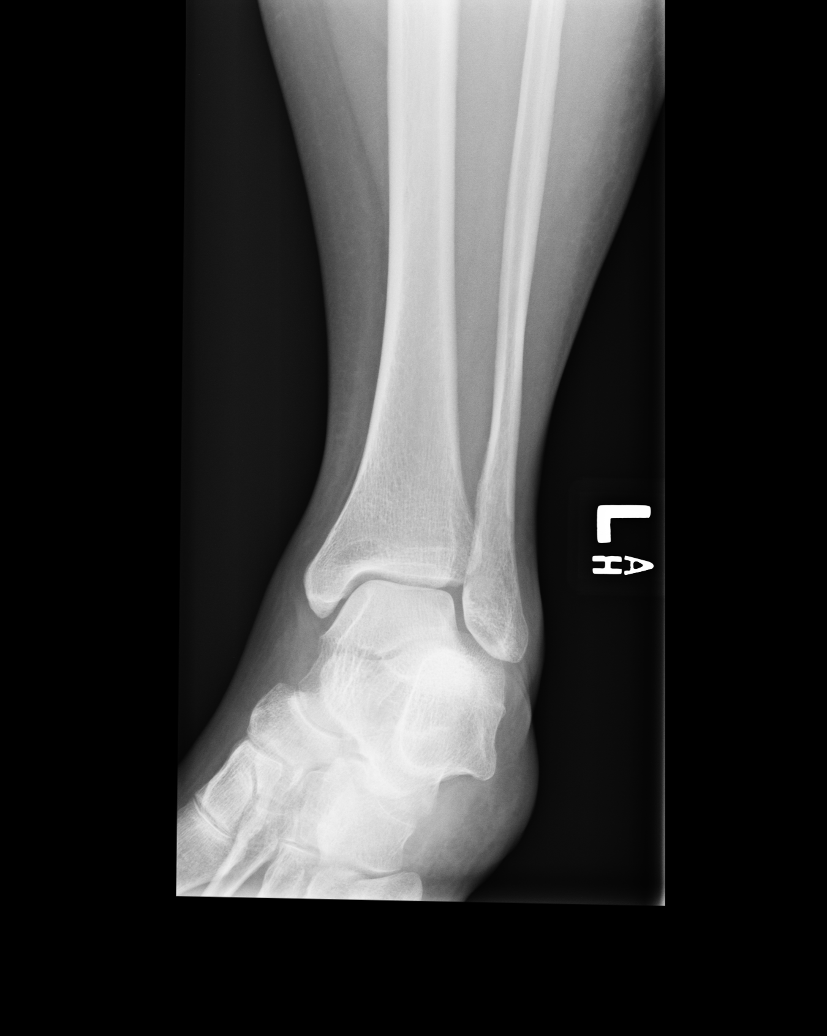
[im 3/5]
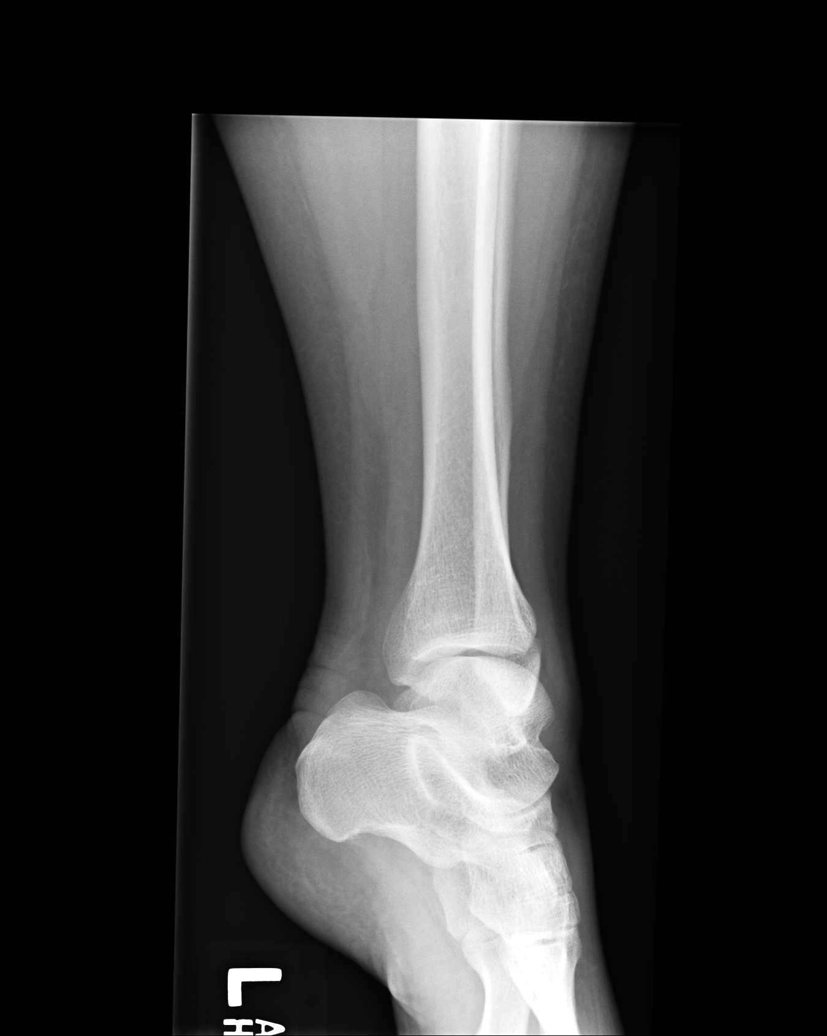
[im 4/5]
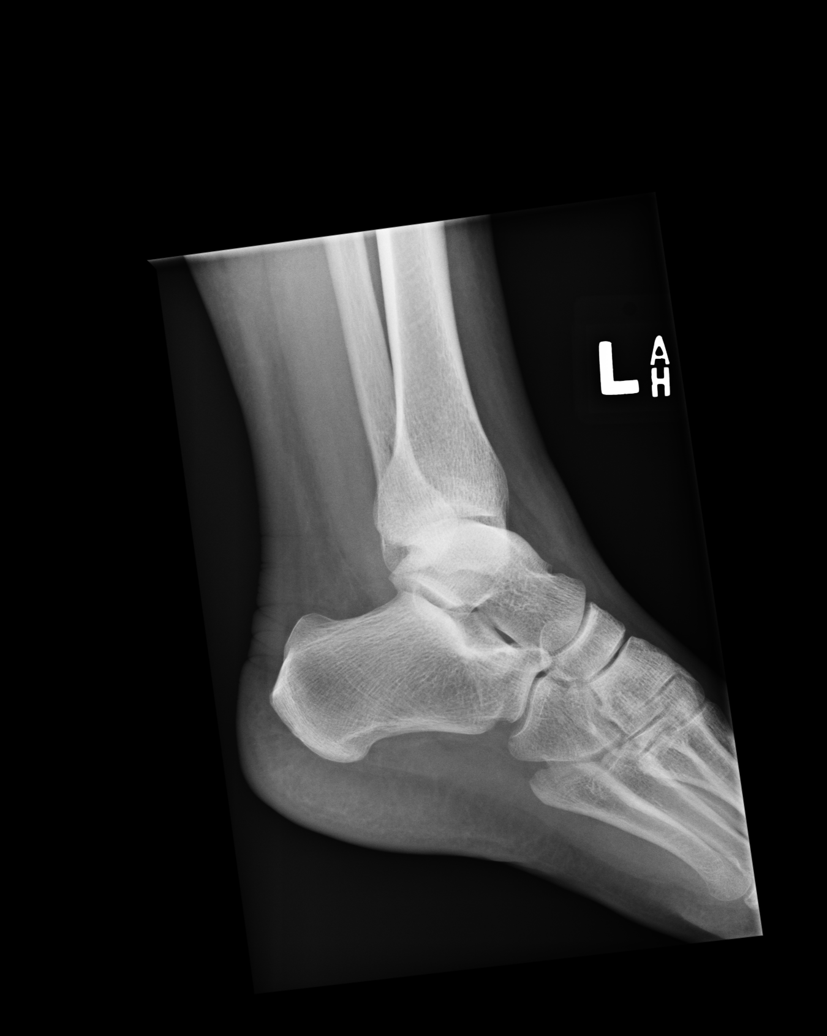
[im 5/5]
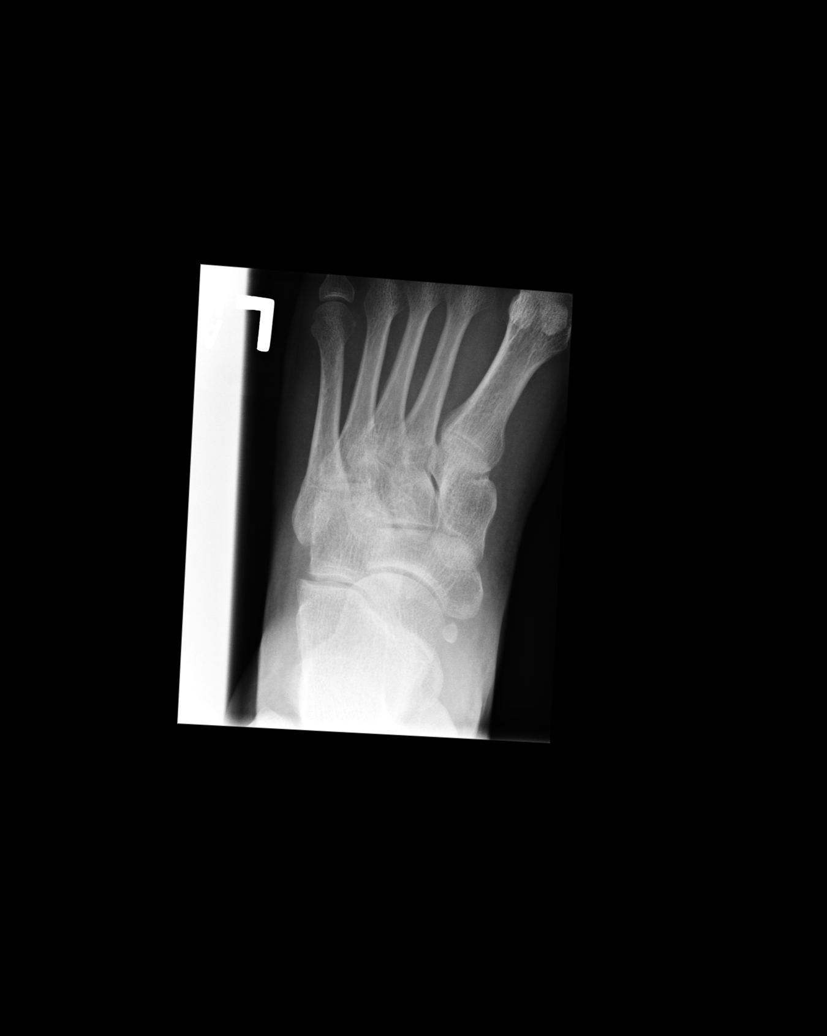

[5 of 5 positions shown; findings below may reference images not displayed]

IMPRESSION: No acute changes are identified.

## 2007-04-20 IMAGING — CR DG TIBIA/FIBULA 2V*L*
1 series · 2 of 2 positions shown · non-contrast
Comparison: none

REASON FOR EXAM: INJURY
COMMENTS:

RESULT:      AP and lateral views of the LEFT lower leg show no fracture,
dislocation or other acute bony abnormality.

[Series 1: view not recorded · 0.17mm/px · 2 of 2 slices shown]
[im 1/2]
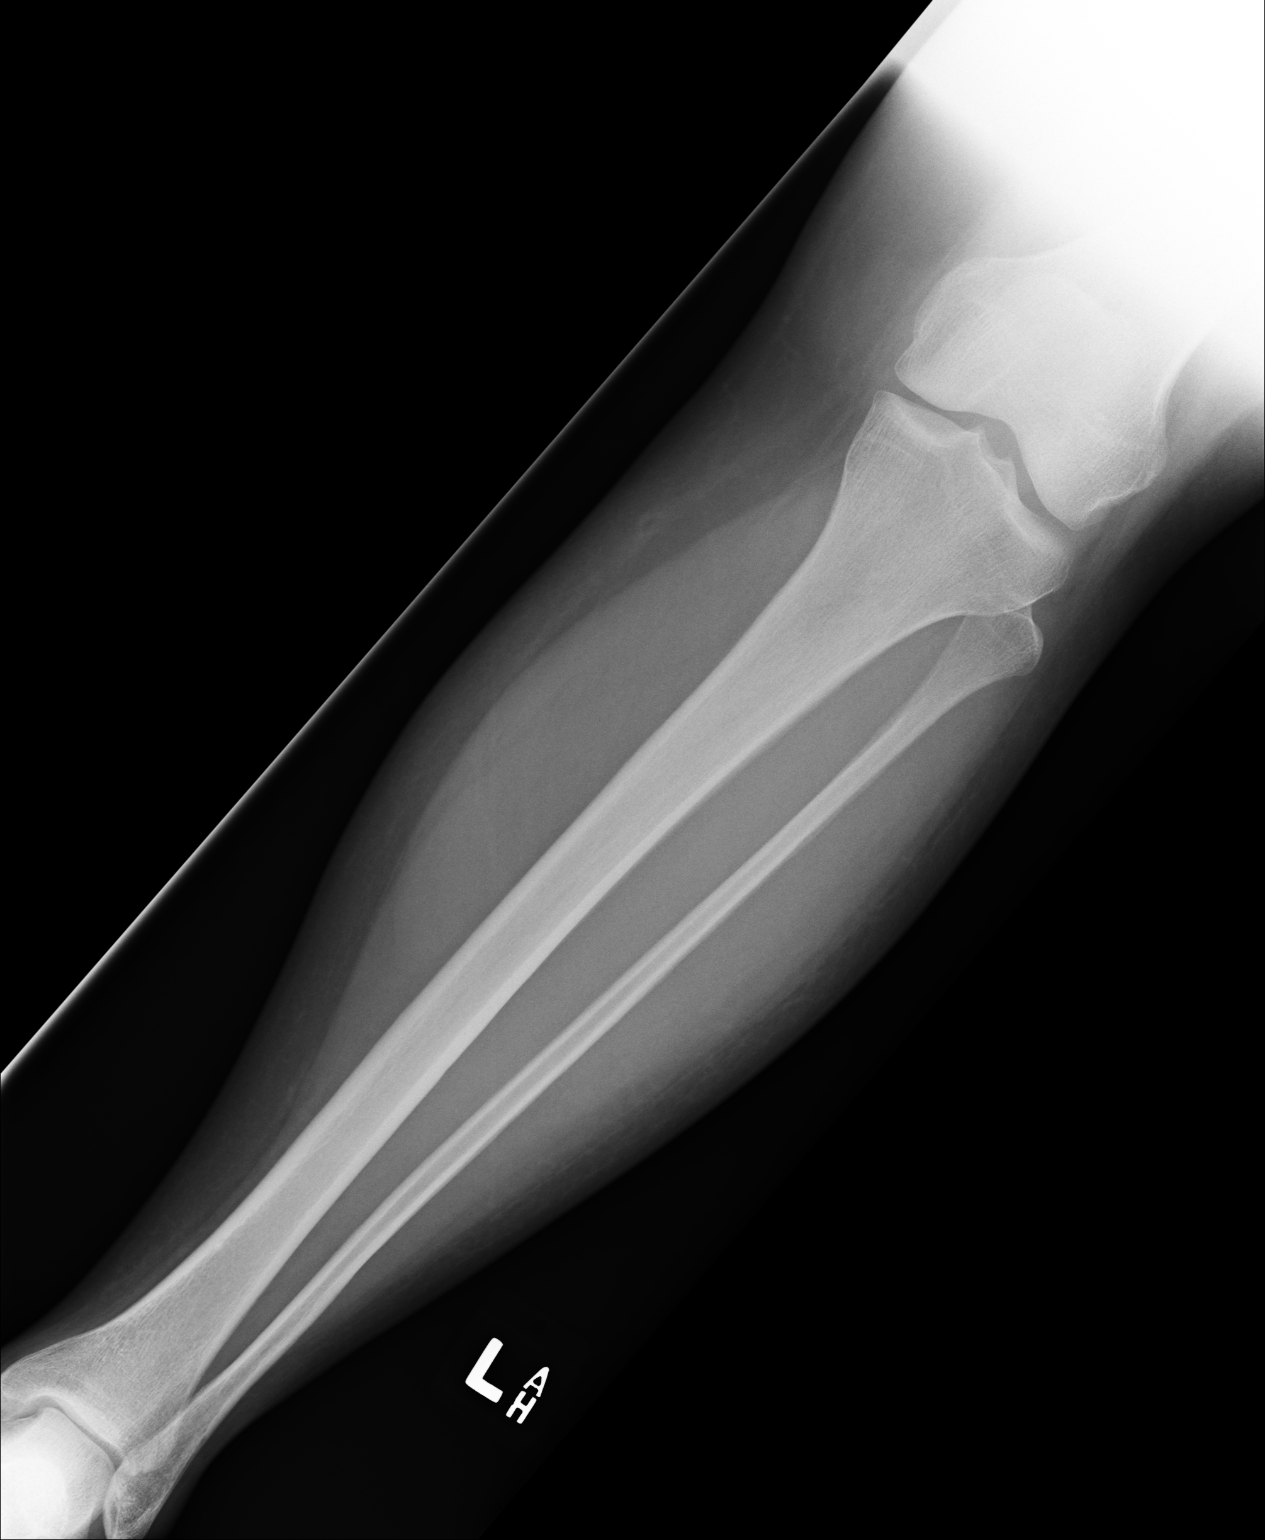
[im 2/2]
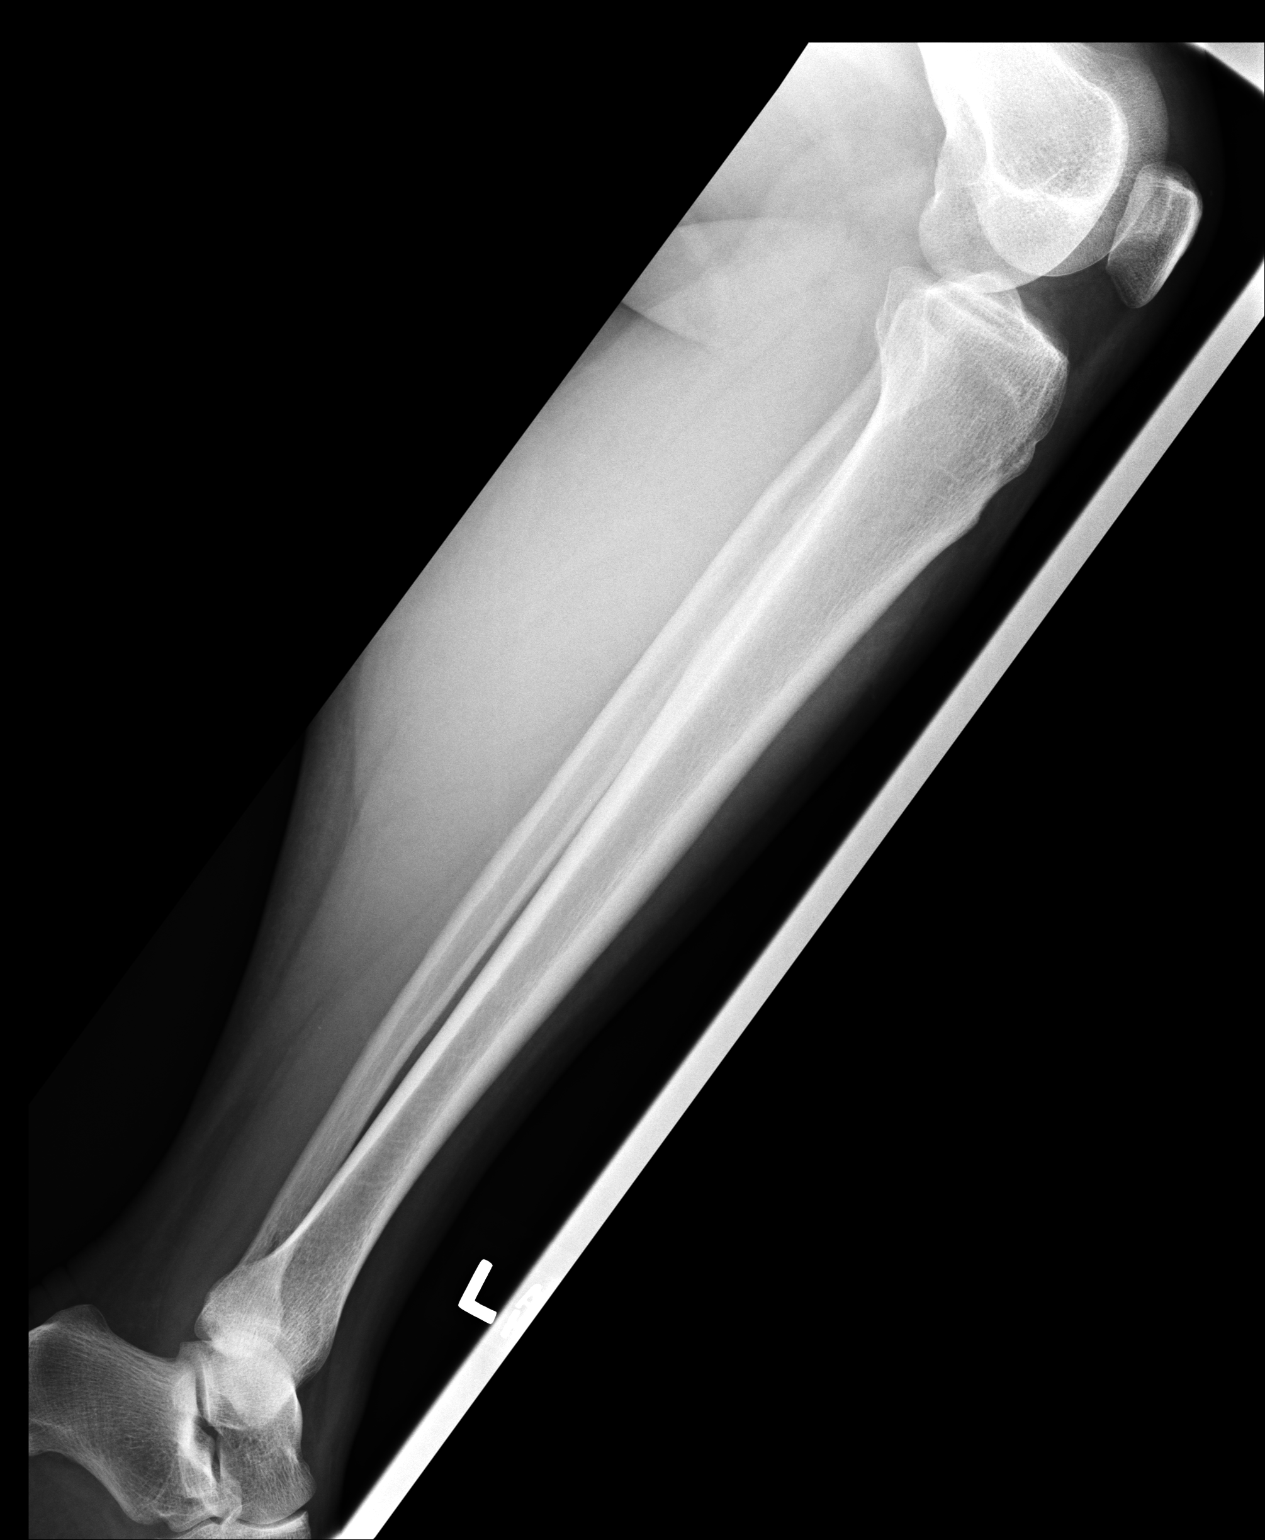

[2 of 2 positions shown; findings below may reference images not displayed]

IMPRESSION: No significant abnormalities are noted.

## 2007-09-11 ENCOUNTER — Inpatient Hospital Stay: Payer: Self-pay | Admitting: Internal Medicine

## 2007-11-11 IMAGING — CR DG RIBS 2V*L*
1 series · 4 of 4 positions shown · non-contrast
Comparison: none

REASON FOR EXAM: injury/blow     Minor care 1
COMMENTS:

PROCEDURE:     DXR - DXR RIBS LEFT UNILATERAL  - August 26, 2006  [DATE]
RESULT:     Multiple views reveals the LEFT ribs to be intact. No acute
fracture is noted.

[Series 1: view not recorded · 0.17mm/px · 4 of 4 slices shown]
[im 1/4]
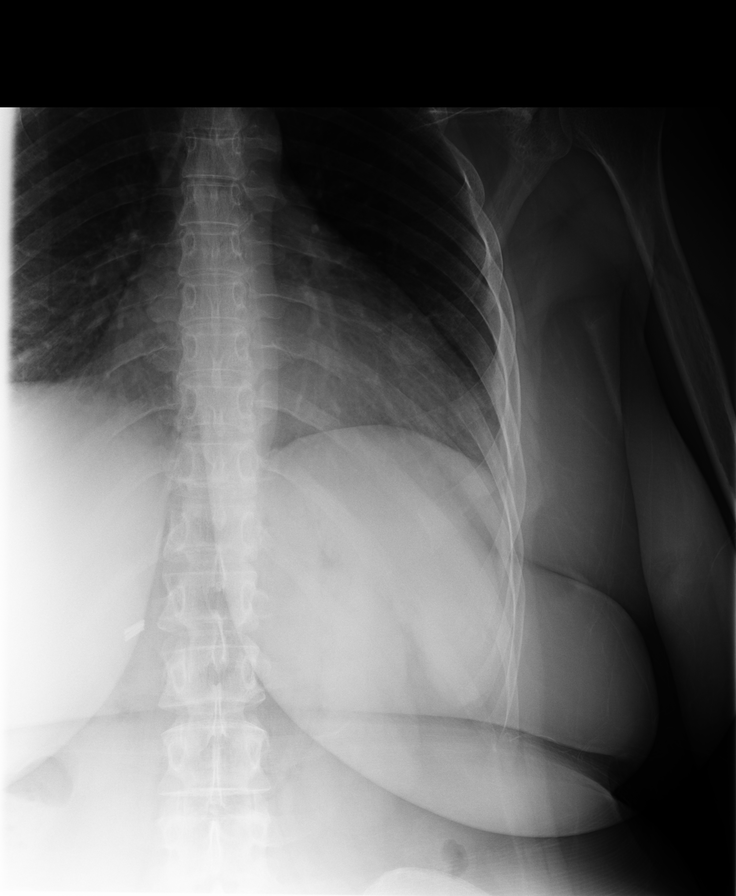
[im 2/4]
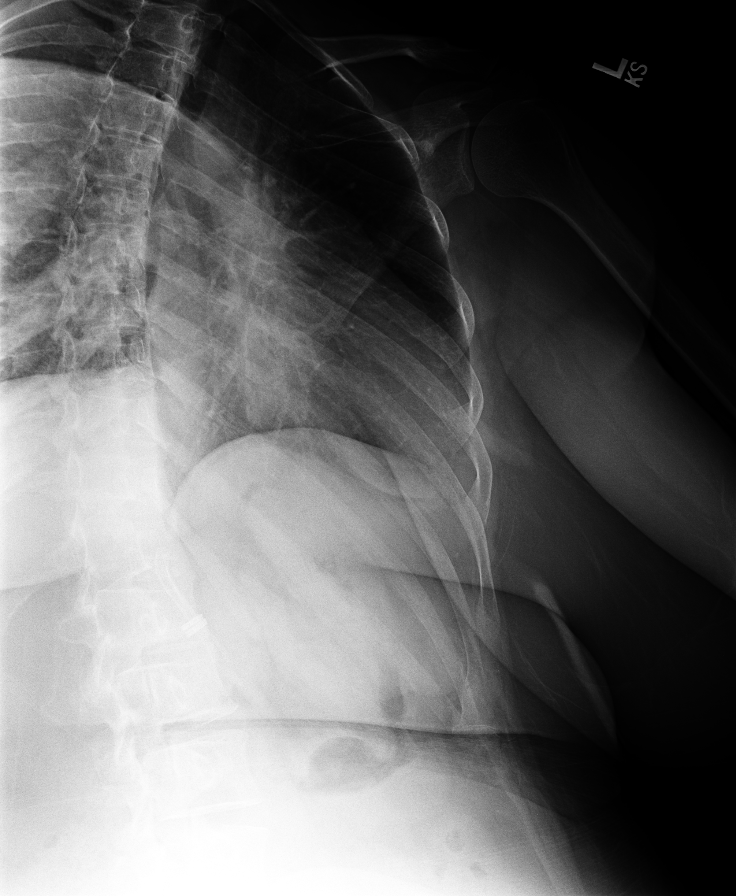
[im 3/4]
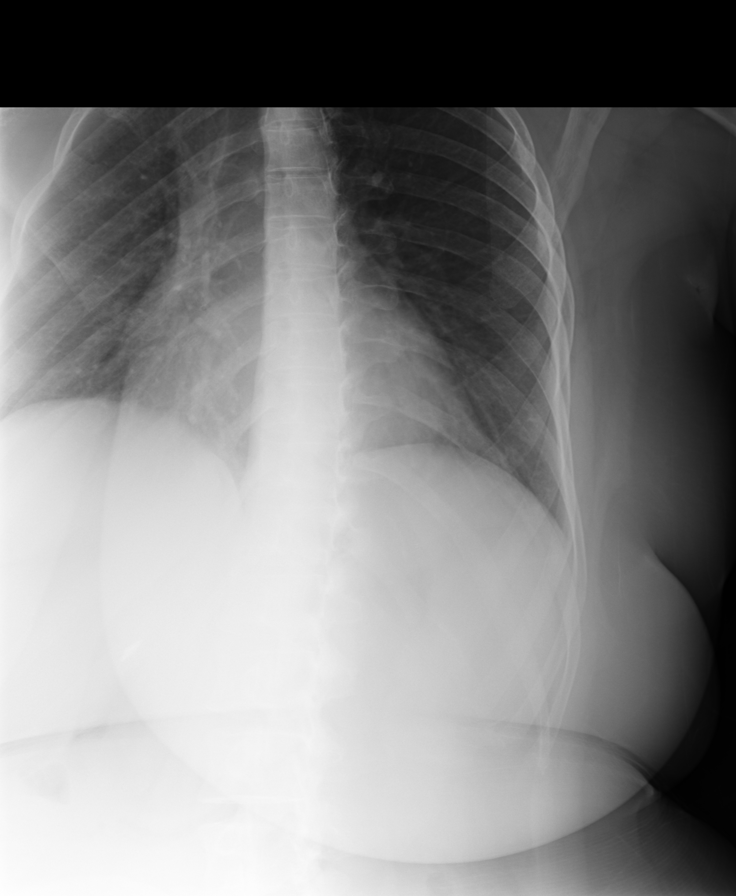
[im 4/4]
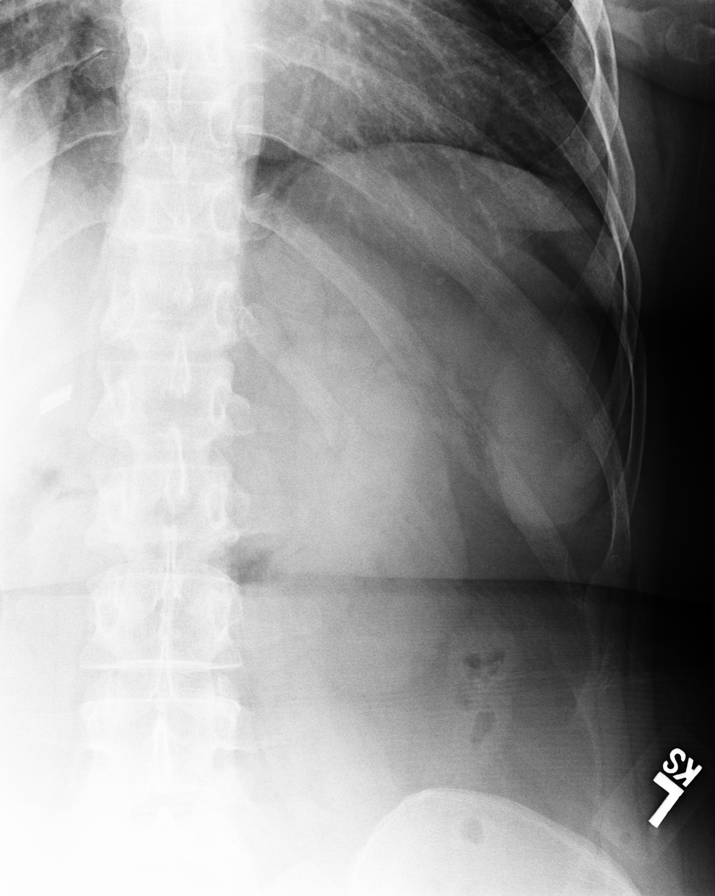

[4 of 4 positions shown; findings below may reference images not displayed]

IMPRESSION: 1)No acute fracture is seen of the LEFT ribs.

## 2008-04-07 IMAGING — CR DG ANKLE COMPLETE 3+V*L*
1 series · 5 of 5 positions shown · non-contrast
Comparison: none

REASON FOR EXAM: inj ent
COMMENTS:   LMP: Post Hysterectomy

PROCEDURE:     DXR - DXR ANKLE LEFT COMPLETE  - January 21, 2007 [DATE]
RESULT:     Five views were obtained and show no fracture, dislocation or
other acute bony abnormality. The ankle mortise is well maintained.

[Series 1: view not recorded · 0.17mm/px · 5 of 5 slices shown]
[im 1/5]
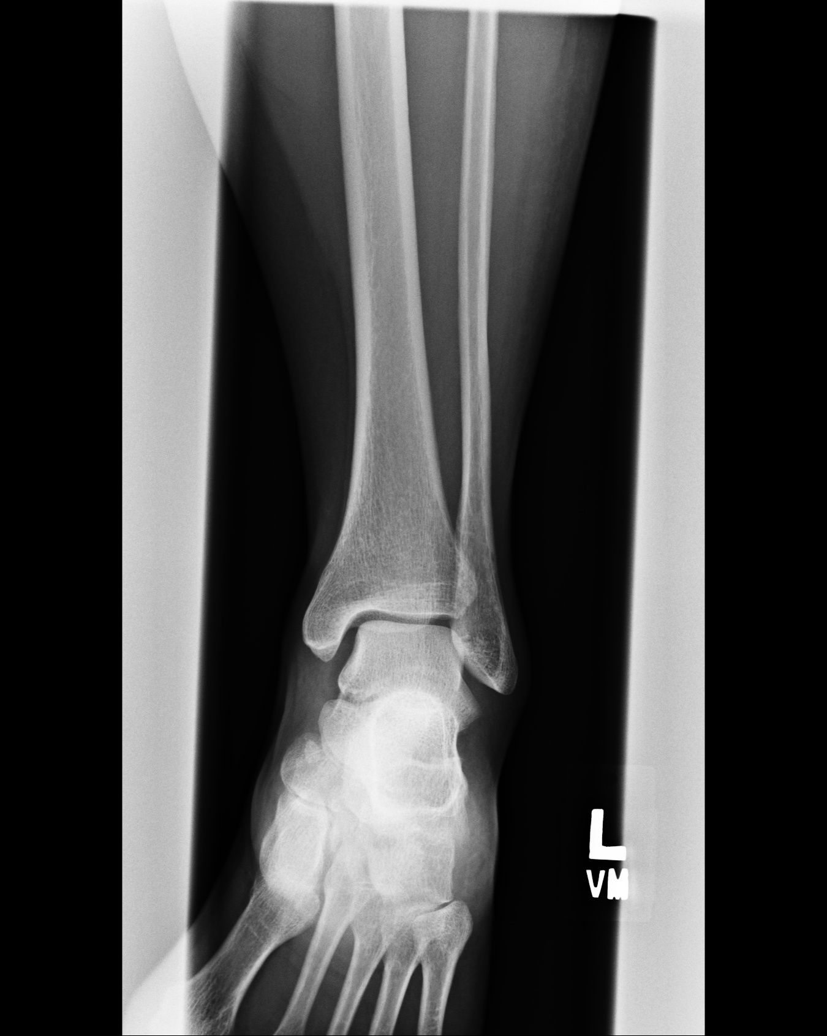
[im 2/5]
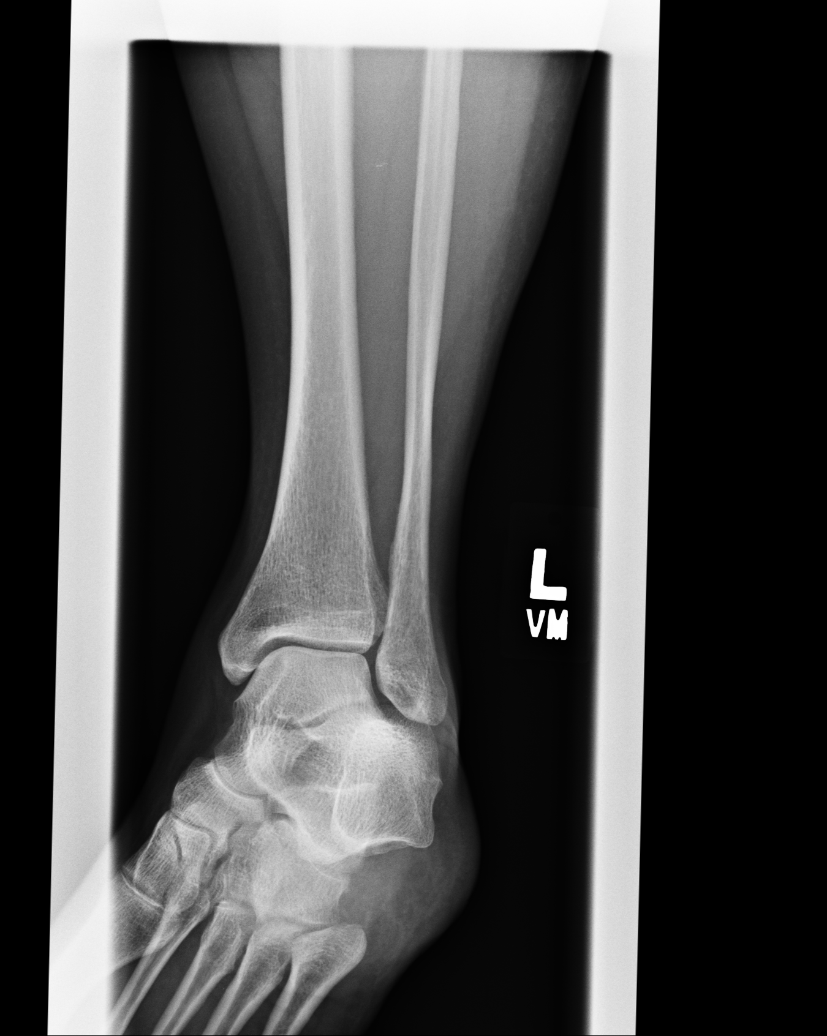
[im 3/5]
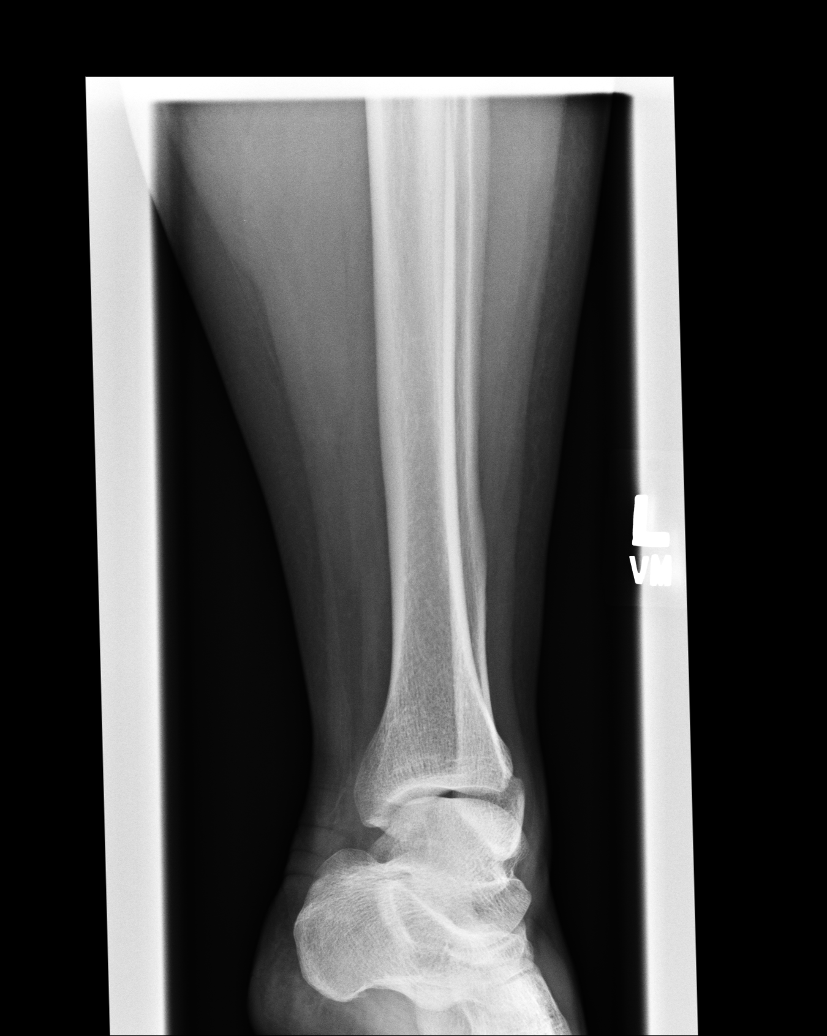
[im 4/5]
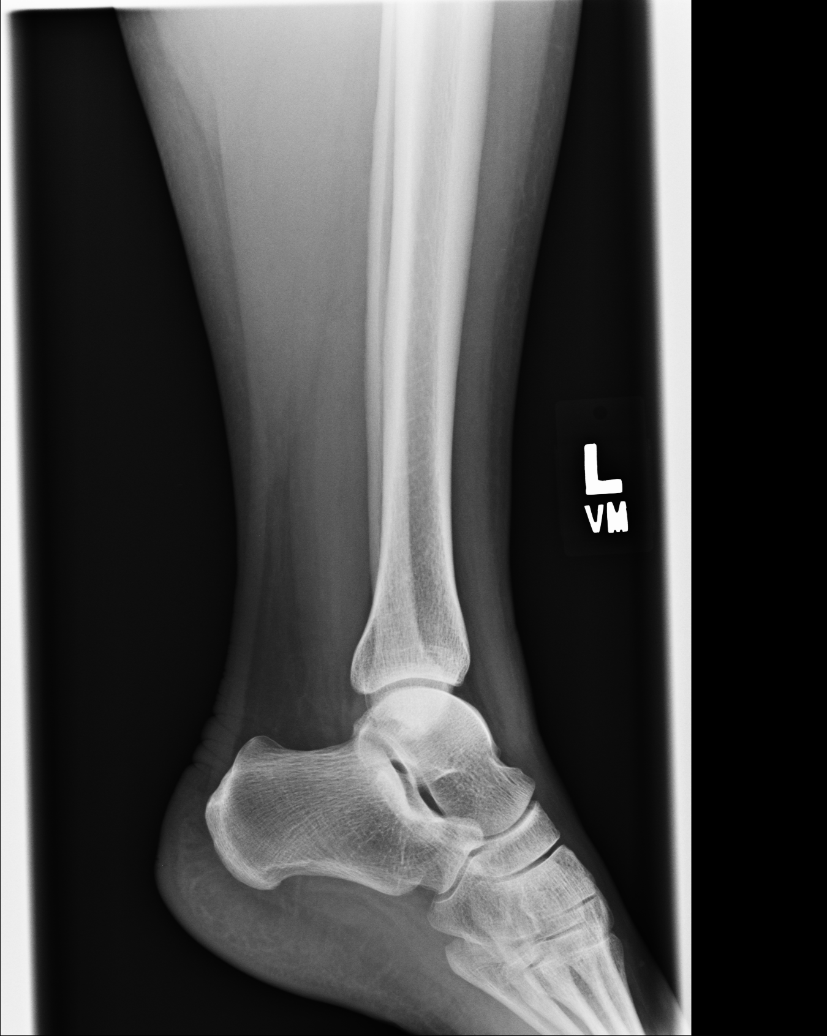
[im 5/5]
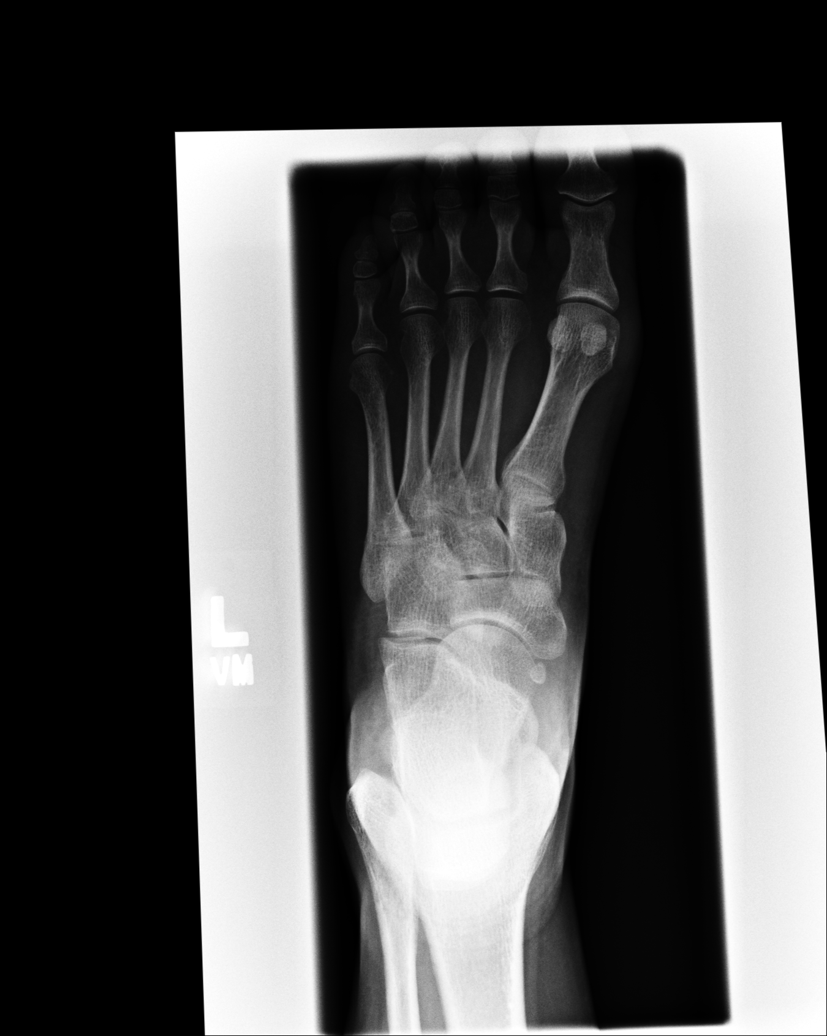

[5 of 5 positions shown; findings below may reference images not displayed]

IMPRESSION: No acute changes are identified.

## 2008-09-05 DIAGNOSIS — K5792 Diverticulitis of intestine, part unspecified, without perforation or abscess without bleeding: Secondary | ICD-10-CM

## 2008-09-05 HISTORY — DX: Diverticulitis of intestine, part unspecified, without perforation or abscess without bleeding: K57.92

## 2008-11-26 IMAGING — CT CT ABD-PELV W/O CM
1 of 2 series · 15 of 32 positions shown, 19 images · non-contrast
Comparison: none

REASON FOR EXAM: LLQ pain
COMMENTS:

[Series 2: stone · axial · 0.68mm/px · z∈[-368,+22]mm · 15 of 147 slices shown, 19 images]
[im 11/147  soft-tissue]
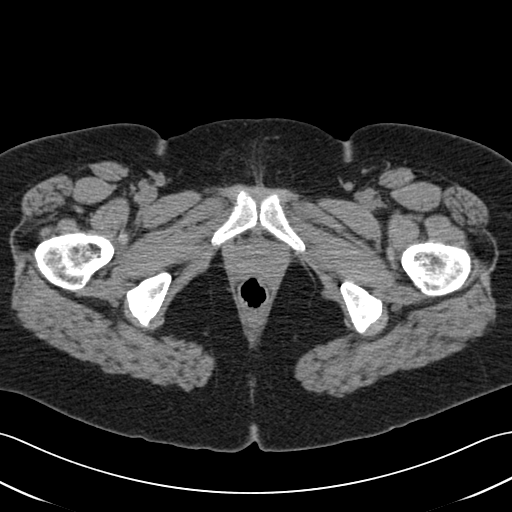
[im 11/147  bone]
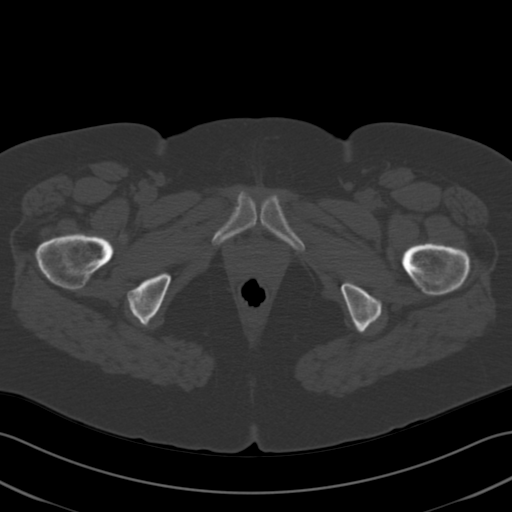
[im 21/147  soft-tissue]
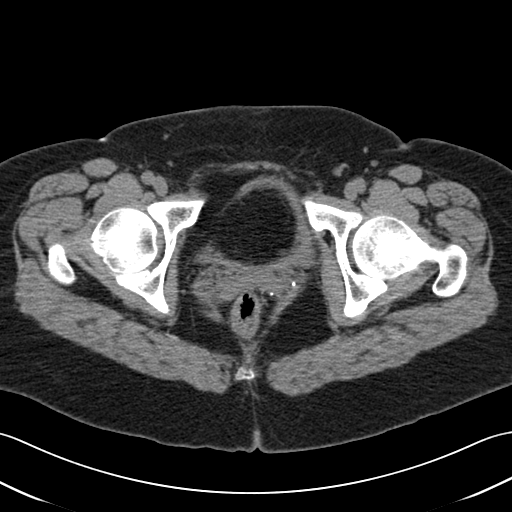
[im 31/147  soft-tissue]
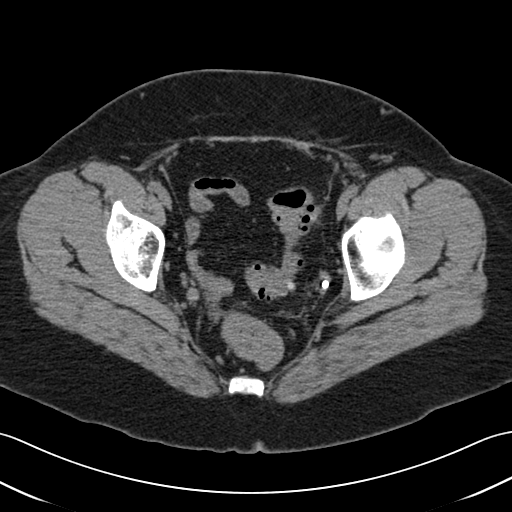
[im 41/147  soft-tissue]
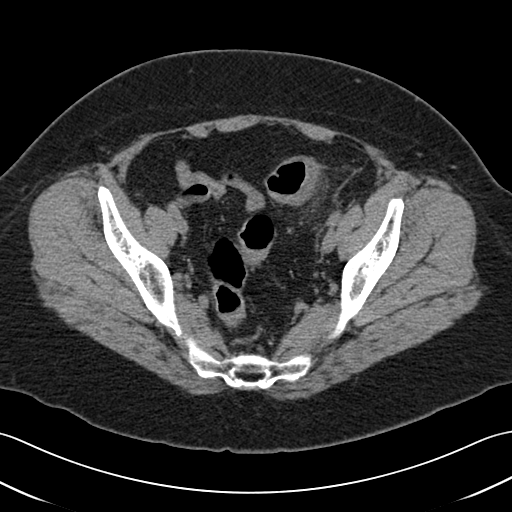
[im 51/147  soft-tissue]
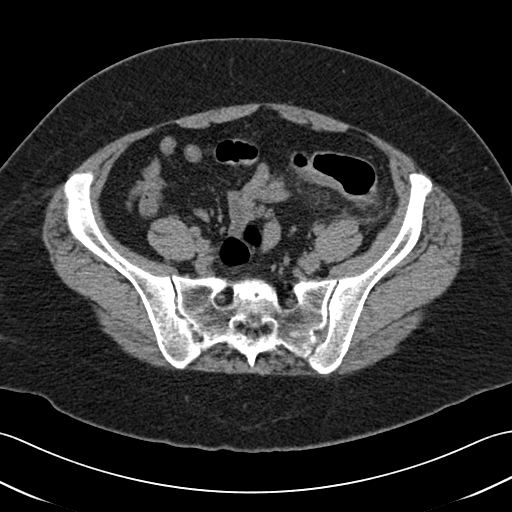
[im 61/147  soft-tissue]
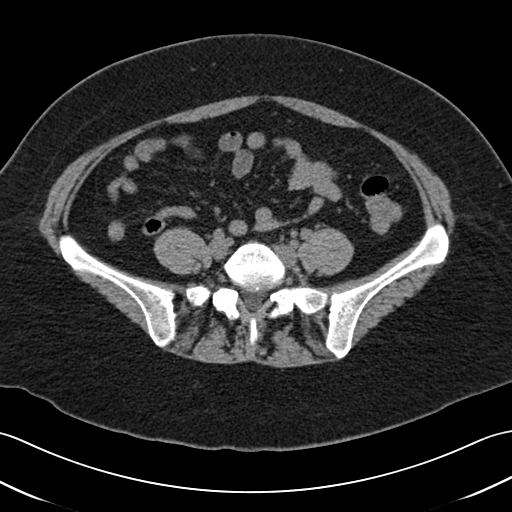
[im 76/147  soft-tissue]
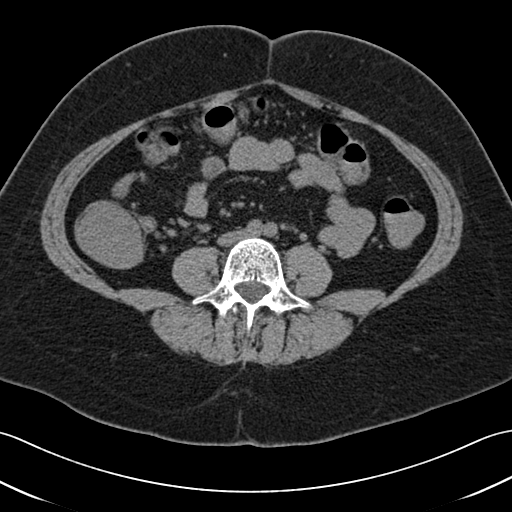
[im 86/147  soft-tissue]
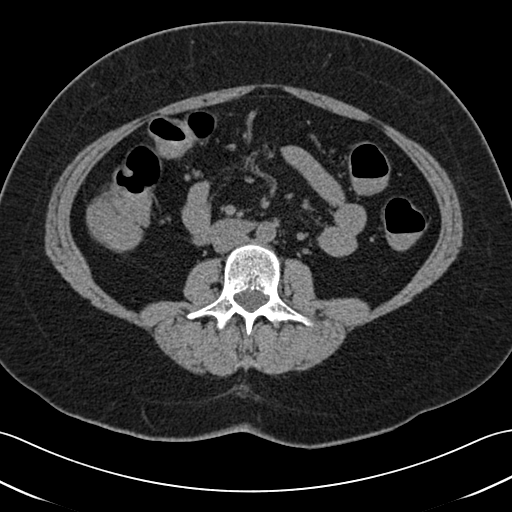
[im 96/147  soft-tissue]
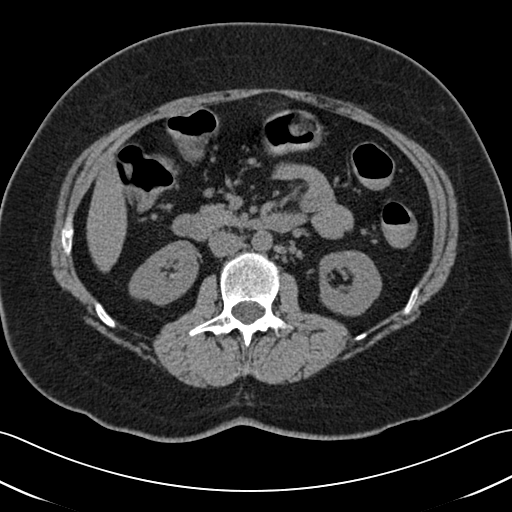
[im 96/147  bone]
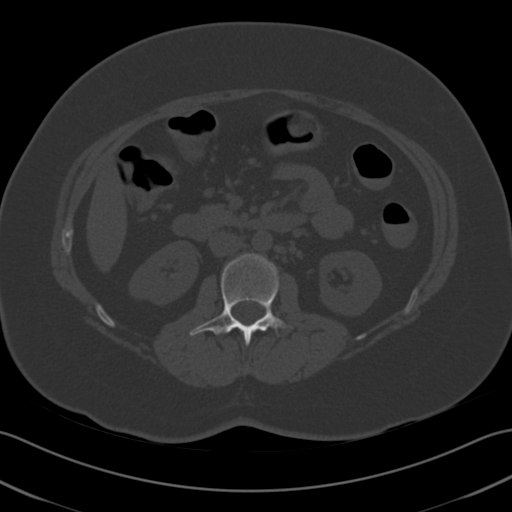
[im 106/147  soft-tissue]
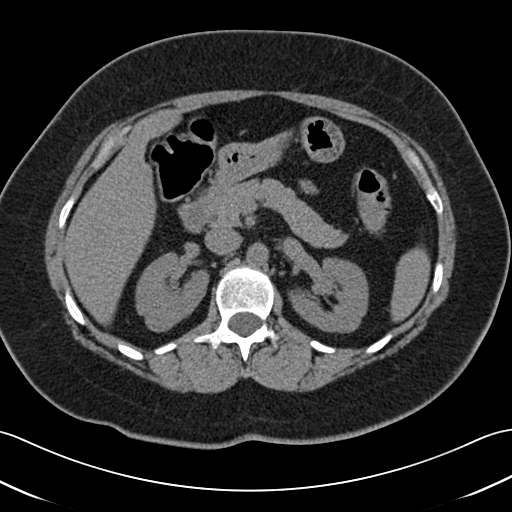
[im 116/147  soft-tissue]
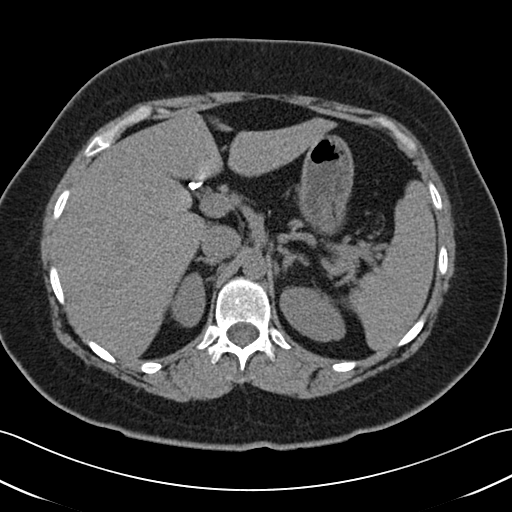
[im 126/147  soft-tissue]
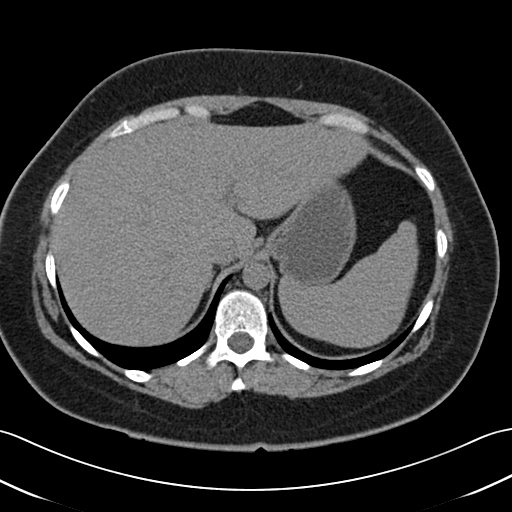
[im 126/147  lung]
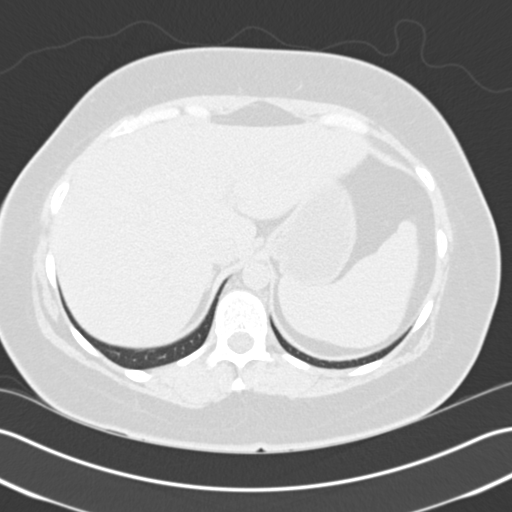
[im 131/147  lung]
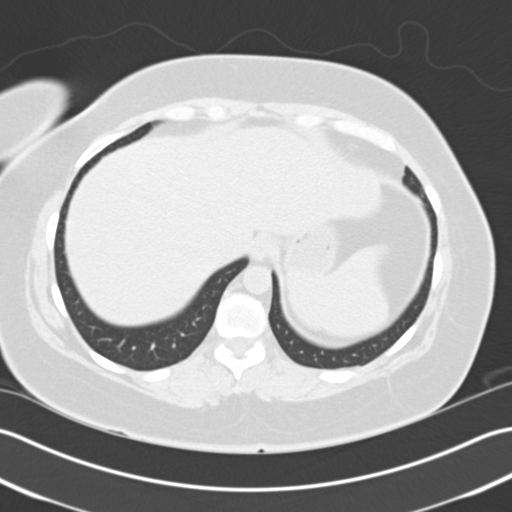
[im 136/147  soft-tissue]
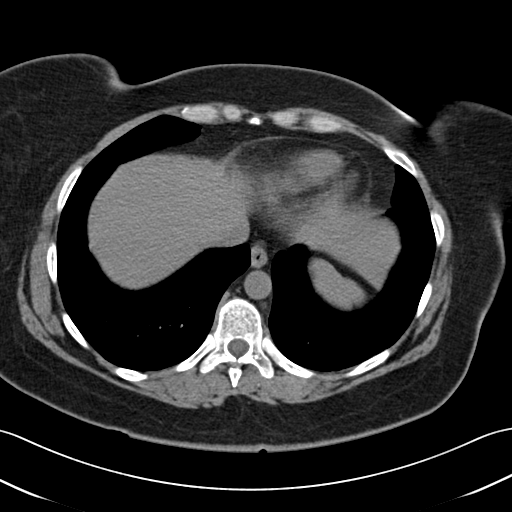
[im 136/147  lung]
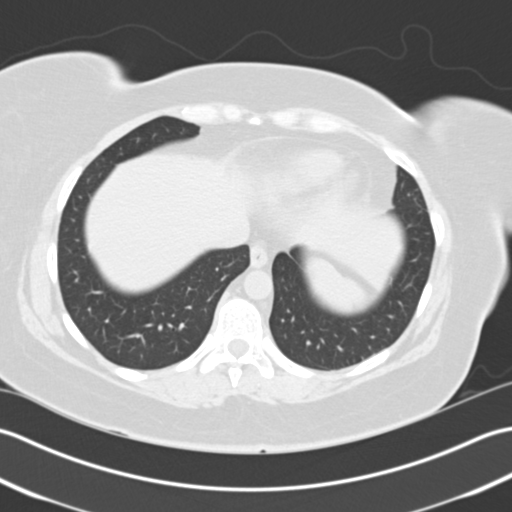
[im 141/147  lung]
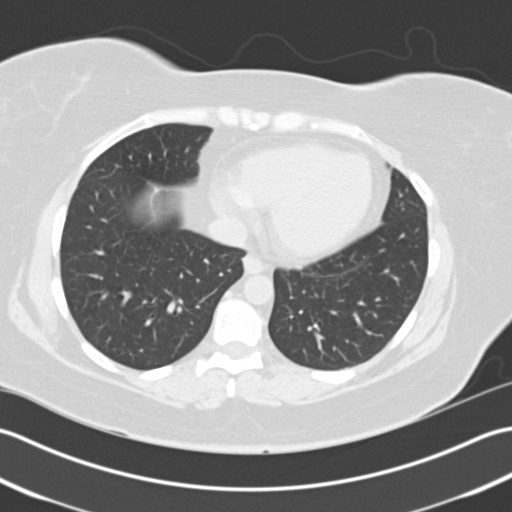

[15 of 32 positions shown; findings below may reference images not displayed]

PROCEDURE:     CT  - CT ABDOMEN AND PELVIS W[DATE]  [DATE]

RESULT:     The study was performed without IV contrast as requested.

There is abnormally increased density in the region of the proximal sigmoid
colon in a fashion consistent with acute diverticulitis. I do not see a
discrete abscess or evidence of free fluid. There is no evidence of bowel
obstruction. Elsewhere, within the pelvis, the uterus is surgically absent.
No adnexal masses are seen. There are phleboliths present. The urinary
bladder is decompressed.

The remainder of the colon appears normal. The liver, pancreas, spleen,
nondistended stomach, adrenal glands and kidneys exhibit no acute
abnormality. The gallbladder is surgically absent. The lung bases are clear.
The lumbar vertebral bodies are preserved in height.
IMPRESSION: 1. There are findings consistent with proximal sigmoid diverticulitis. I do
not see evidence of obstruction or a discrete abscess.
2.  I do not see acute abnormality elsewhere within the abdomen.

A preliminary report was sent to the [HOSPITAL] the conclusion
of the study.

## 2008-11-28 IMAGING — CR DG ABDOMEN 3V
1 series · 3 of 3 positions shown · non-contrast
Comparison: none

REASON FOR EXAM: Abdominal pain
COMMENTS:

[Series 1: view not recorded · 0.17mm/px · 3 of 3 slices shown]
[im 1/3]
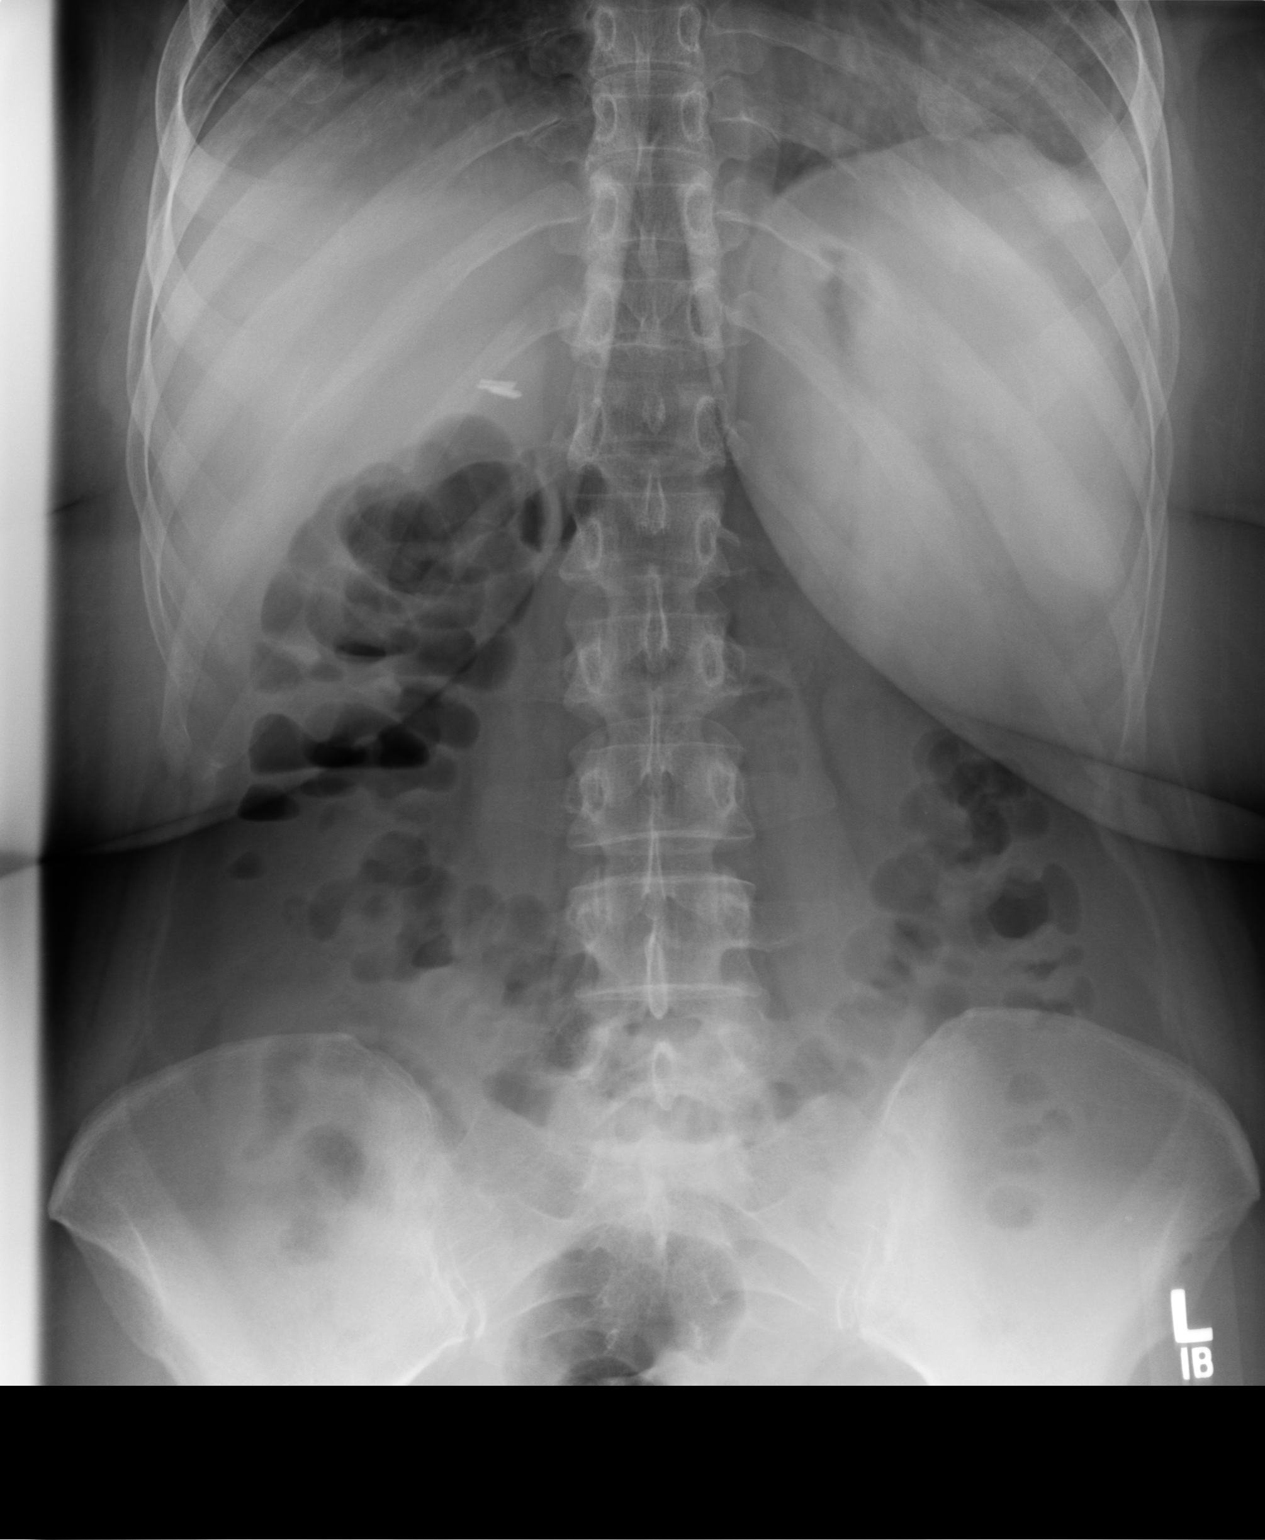
[im 2/3]
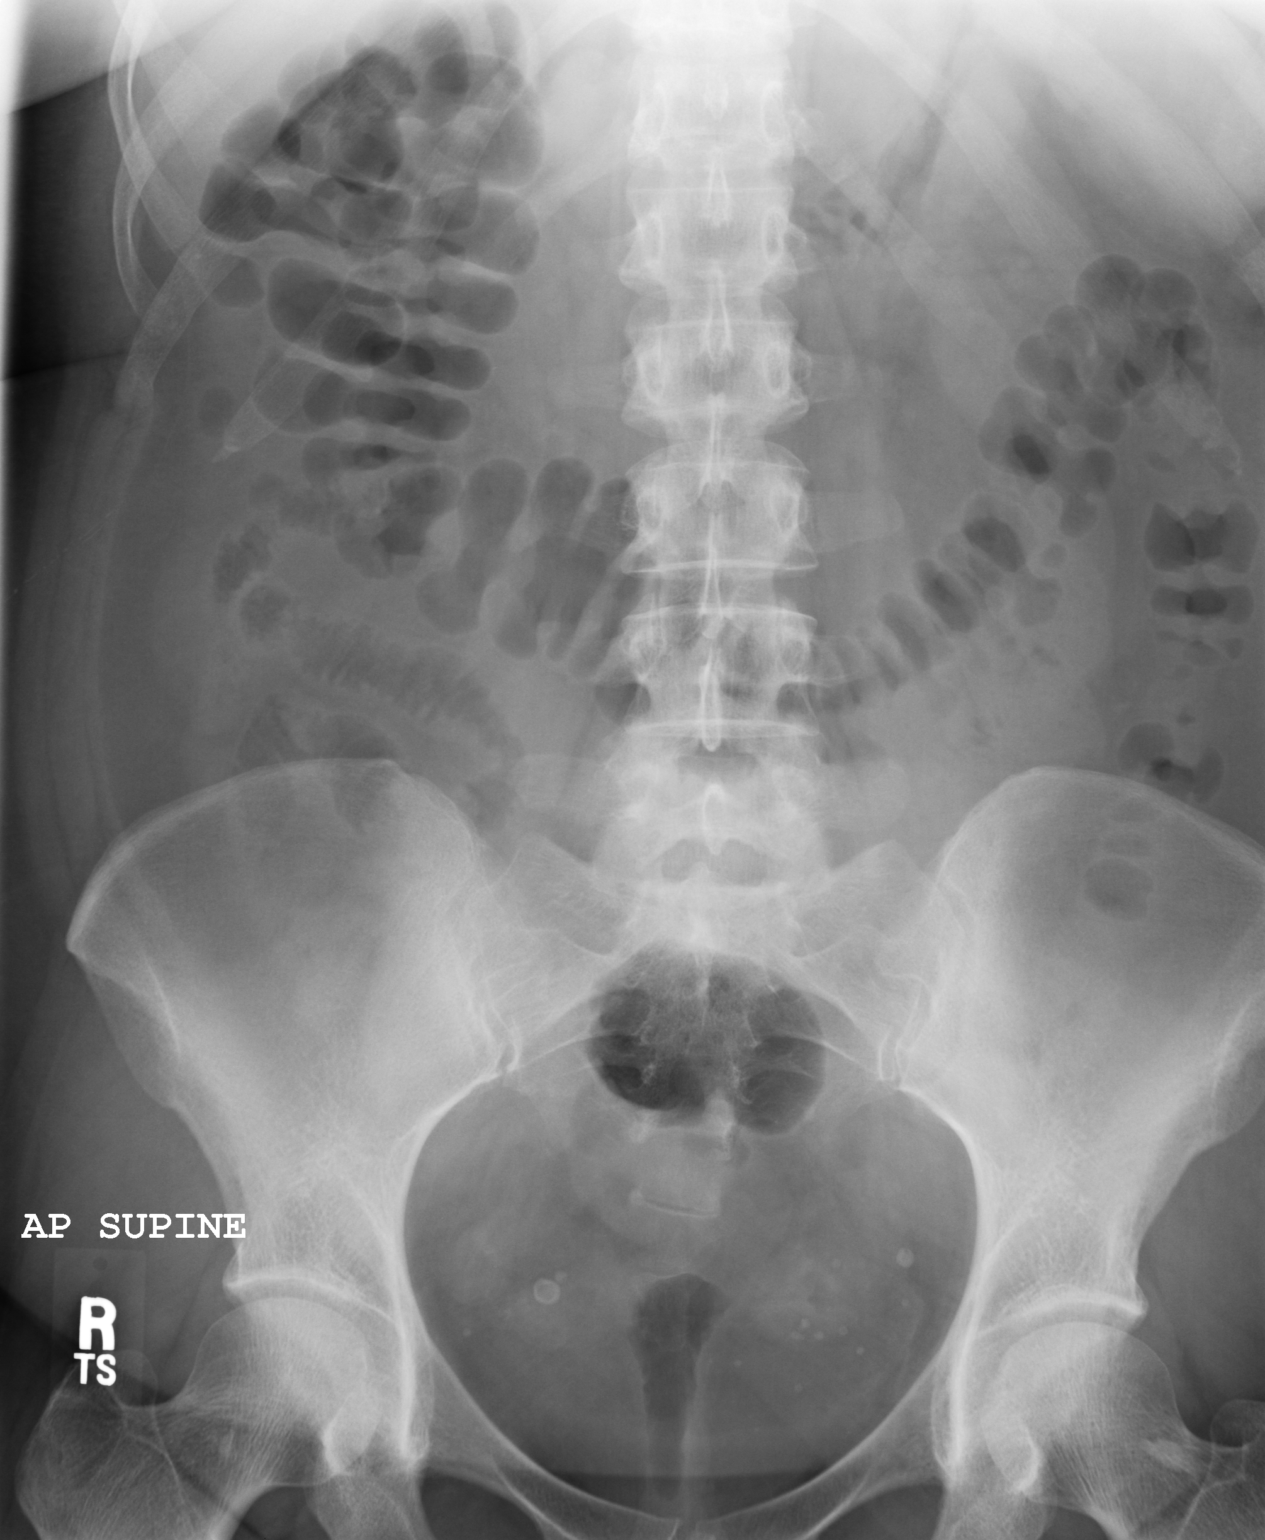
[im 3/3]
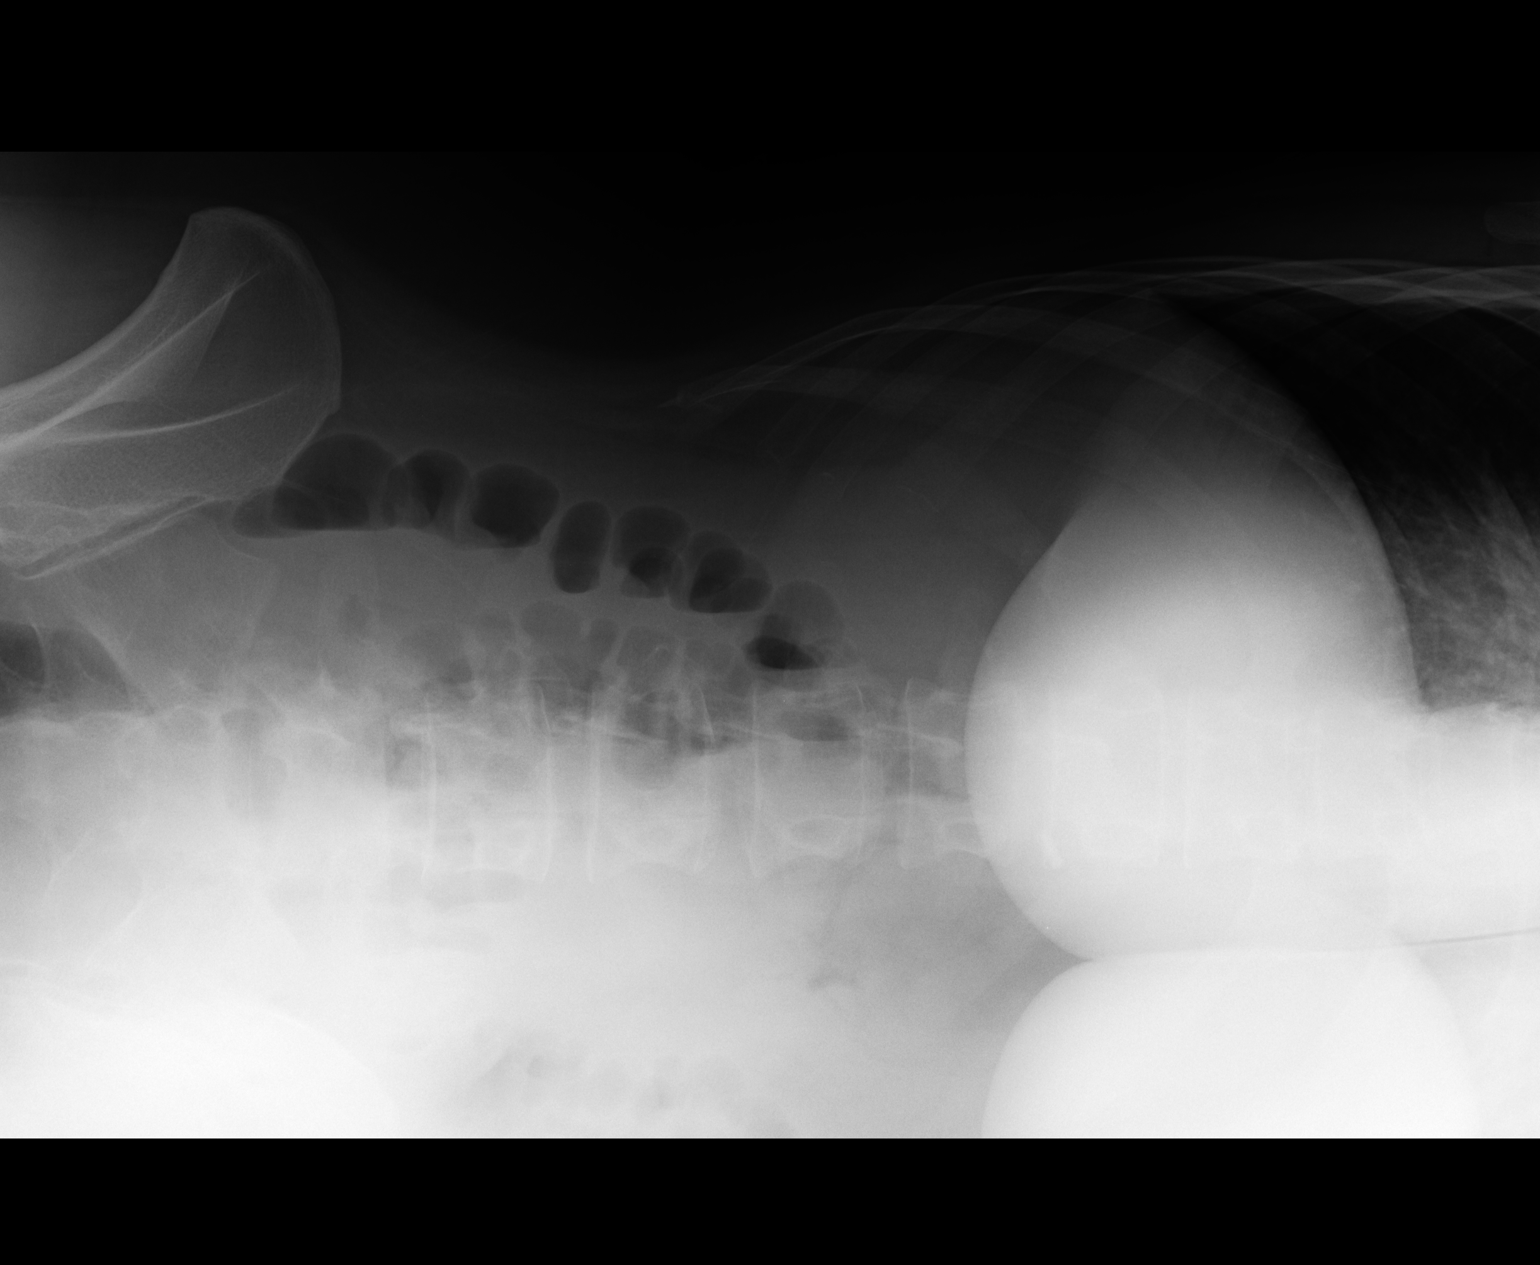

[3 of 3 positions shown; findings below may reference images not displayed]

PROCEDURE:     DXR - DXR ABDOMEN COMPLETE  - September 13, 2007  [DATE]

RESULT:     Erect, supine and lateral decubitus views of the abdomen were
obtained.

No subdiaphragmatic free air is seen. The bowel gas pattern shows no
specific abnormalities. There is no evidence for bowel obstruction. No
abnormal intra-abdominal calcifications are noted. The psoas margins are
visualized bilaterally. Post-operative metallic clips are noted in the
region of the gallbladder bed. The osseous structures are normal in
appearance.
IMPRESSION: No significant abnormalities are identified.

## 2008-12-26 ENCOUNTER — Encounter: Payer: Self-pay | Admitting: Endocrinology

## 2008-12-26 ENCOUNTER — Inpatient Hospital Stay: Payer: Self-pay | Admitting: Internal Medicine

## 2008-12-27 ENCOUNTER — Encounter: Payer: Self-pay | Admitting: Endocrinology

## 2009-01-20 ENCOUNTER — Ambulatory Visit: Payer: Self-pay | Admitting: Endocrinology

## 2009-01-20 DIAGNOSIS — E162 Hypoglycemia, unspecified: Secondary | ICD-10-CM

## 2009-01-20 DIAGNOSIS — E042 Nontoxic multinodular goiter: Secondary | ICD-10-CM

## 2009-01-20 DIAGNOSIS — K5732 Diverticulitis of large intestine without perforation or abscess without bleeding: Secondary | ICD-10-CM | POA: Insufficient documentation

## 2009-01-20 DIAGNOSIS — K219 Gastro-esophageal reflux disease without esophagitis: Secondary | ICD-10-CM | POA: Insufficient documentation

## 2009-01-27 ENCOUNTER — Ambulatory Visit: Payer: Self-pay | Admitting: Surgery

## 2009-02-04 ENCOUNTER — Other Ambulatory Visit: Admission: RE | Admit: 2009-02-04 | Discharge: 2009-02-04 | Payer: Self-pay | Admitting: Interventional Radiology

## 2009-02-04 ENCOUNTER — Encounter: Admission: RE | Admit: 2009-02-04 | Discharge: 2009-02-04 | Payer: Self-pay | Admitting: Endocrinology

## 2009-02-04 ENCOUNTER — Encounter: Payer: Self-pay | Admitting: Endocrinology

## 2009-02-04 ENCOUNTER — Encounter (INDEPENDENT_AMBULATORY_CARE_PROVIDER_SITE_OTHER): Payer: Self-pay | Admitting: Interventional Radiology

## 2009-02-11 ENCOUNTER — Telehealth (INDEPENDENT_AMBULATORY_CARE_PROVIDER_SITE_OTHER): Payer: Self-pay | Admitting: *Deleted

## 2009-02-26 ENCOUNTER — Inpatient Hospital Stay: Payer: Self-pay | Admitting: Surgery

## 2009-03-08 ENCOUNTER — Emergency Department: Payer: Self-pay | Admitting: Emergency Medicine

## 2009-08-06 ENCOUNTER — Ambulatory Visit: Payer: Self-pay | Admitting: Endocrinology

## 2009-08-07 ENCOUNTER — Observation Stay: Payer: Self-pay | Admitting: Specialist

## 2010-03-13 IMAGING — CT CT CHEST W/ CM
2 series · 16 of 31 positions shown, 20 images · IV contrast (APPLIED)
Comparison: none

REASON FOR EXAM: rule out PE protocal
COMMENTS:

PROCEDURE:     CT  - CT CHEST (FOR PE) W  - December 26, 2008  [DATE]
RESULT:
HISTORY: PE.
COMPARISON STUDIES: No recent.
PROCEDURE AND FINDINGS: IV contrast-enhanced CT of the Chest is obtained.
The thoracic aorta is normal. The adrenals are normal. No pulmonary emboli
are noted.  Large airways are patent.  RIGHT lower lobe pneumonia is
present.

[Series 4: soft tissue · axial · 0.62mm/px · z∈[-279,-204]mm · 3 of 82 slices shown]
[im 7/82  mediastinal]
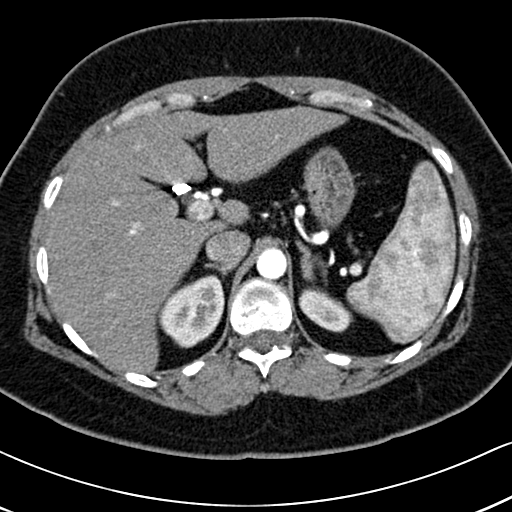
[im 19/82  mediastinal]
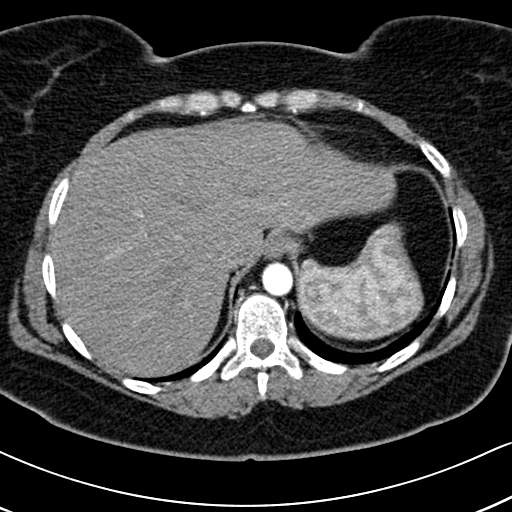
[im 32/82  mediastinal]
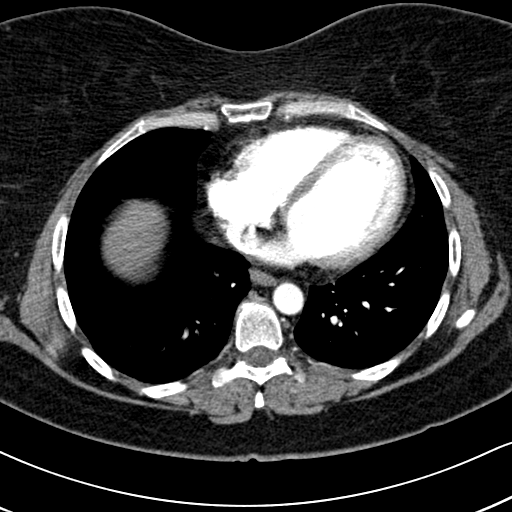

[Series 5: lung windows · axial · 0.62mm/px · z∈[-273,-75]mm · 13 of 80 slices shown, 17 images]
[im 7/80  mediastinal]
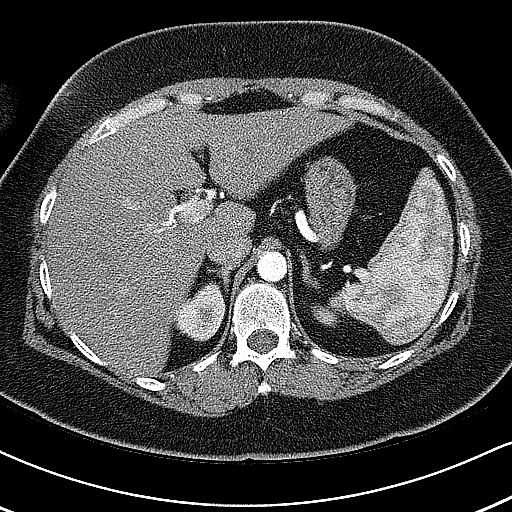
[im 7/80  lung]
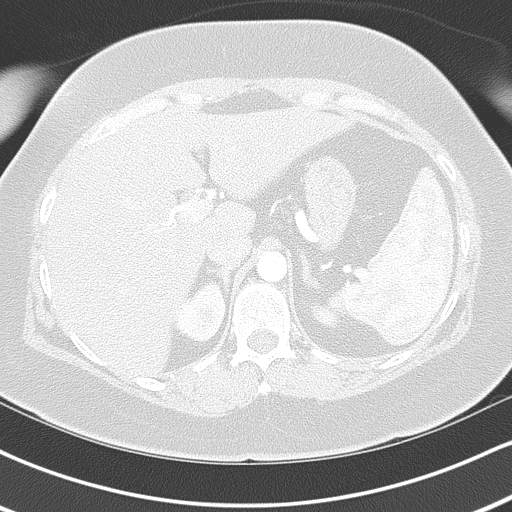
[im 13/80  lung]
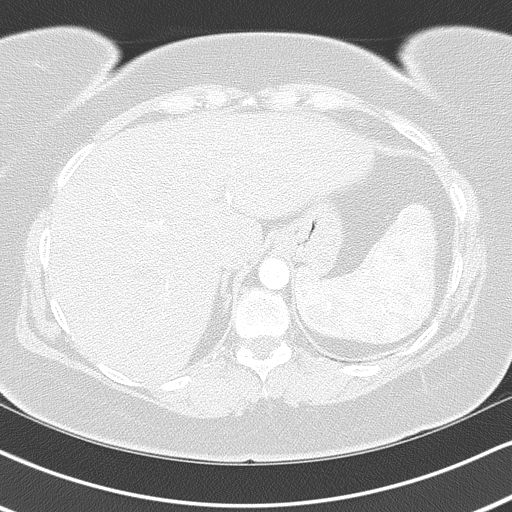
[im 19/80  lung]
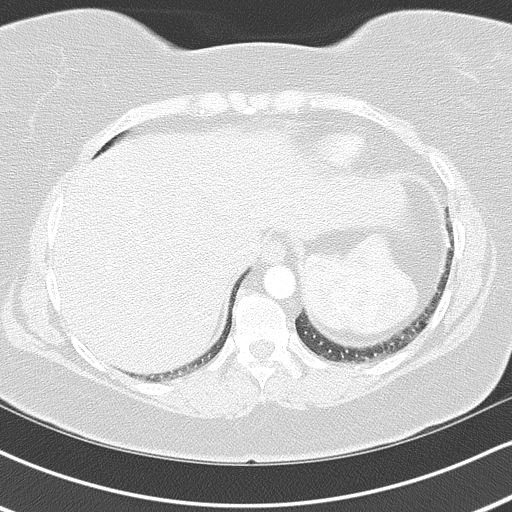
[im 25/80  lung]
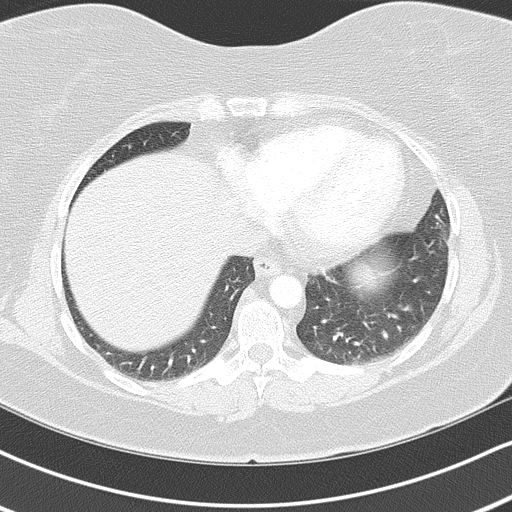
[im 31/80  mediastinal]
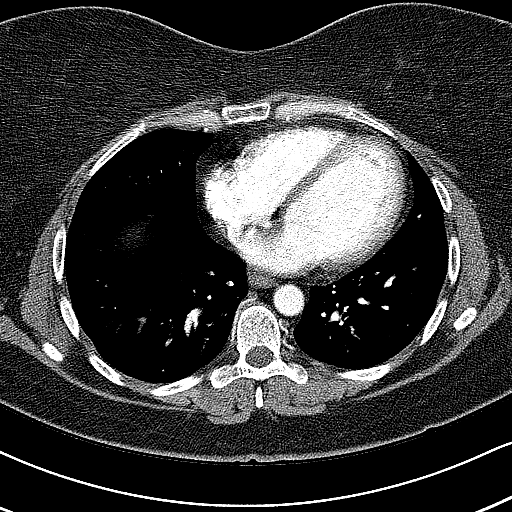
[im 31/80  lung]
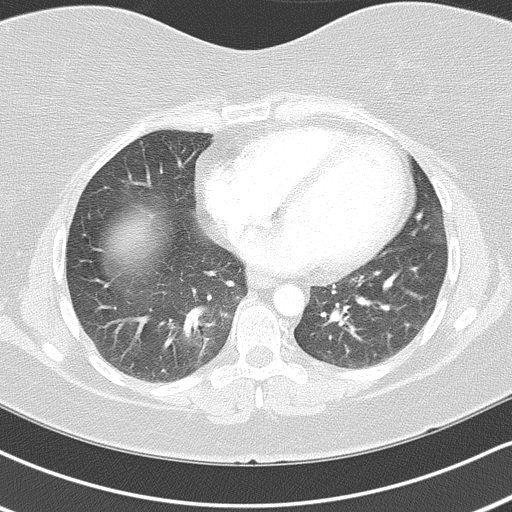
[im 37/80  lung]
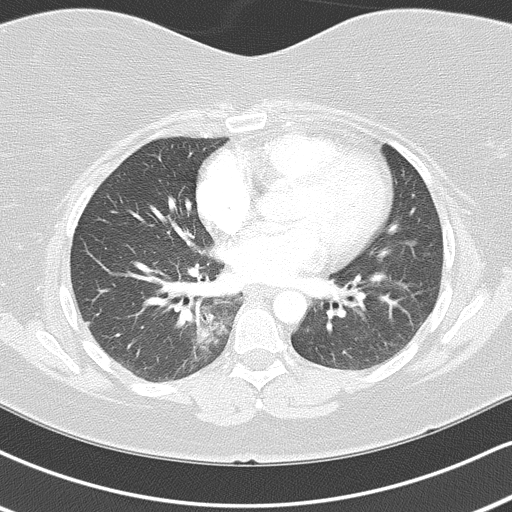
[im 40/80  lung]
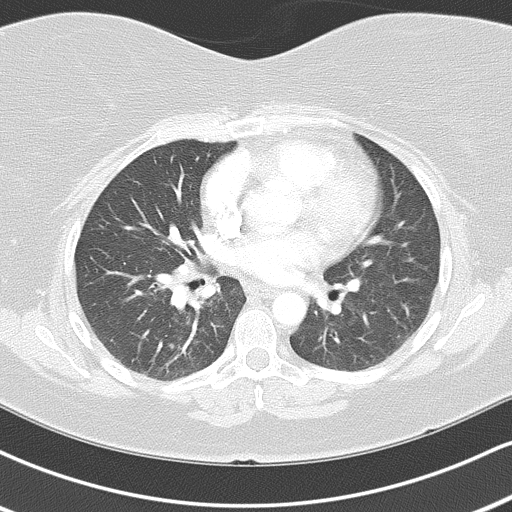
[im 43/80  lung]
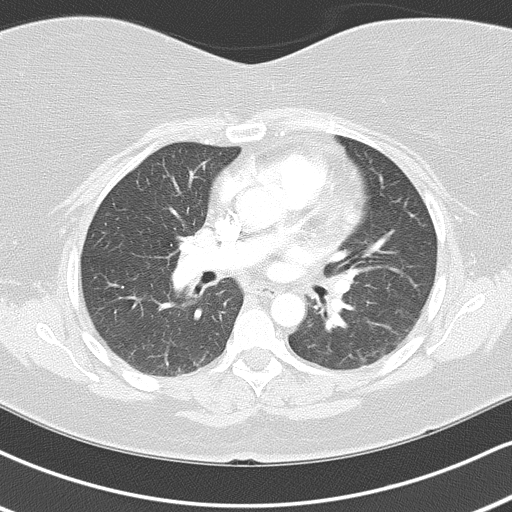
[im 49/80  mediastinal]
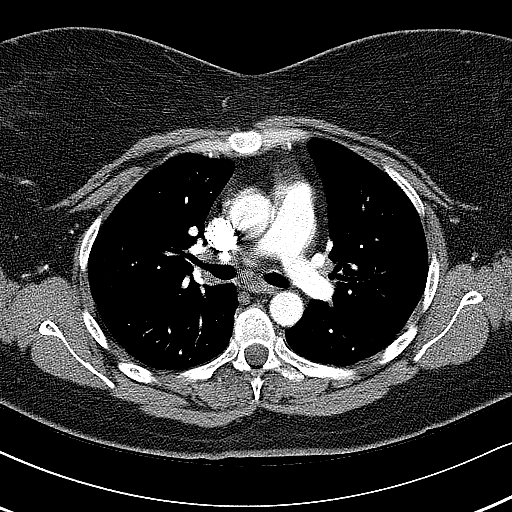
[im 49/80  lung]
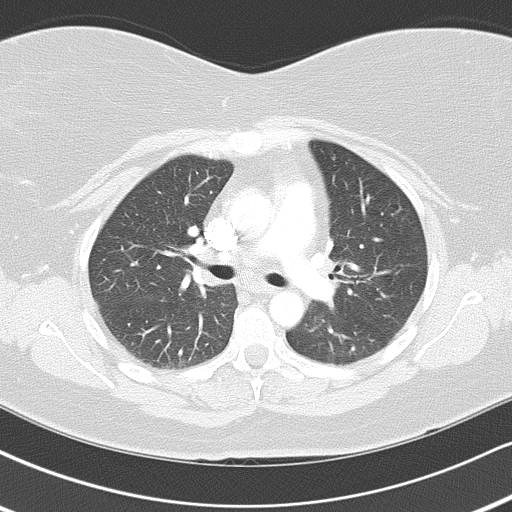
[im 55/80  lung]
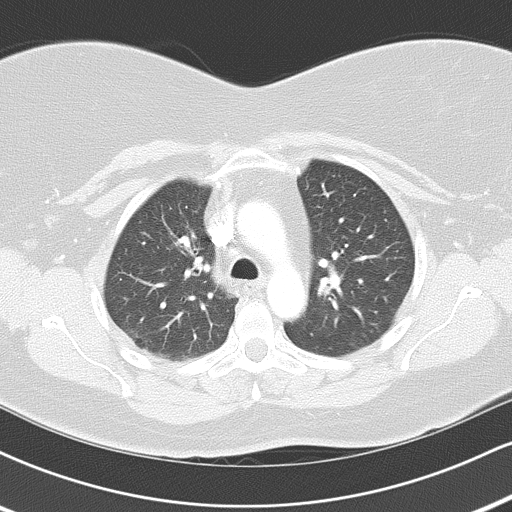
[im 61/80  lung]
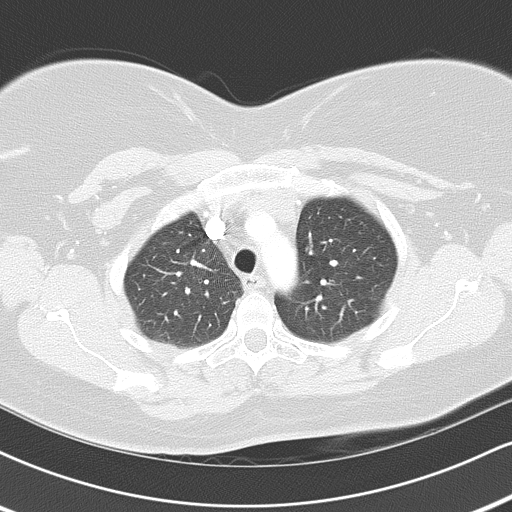
[im 67/80  lung]
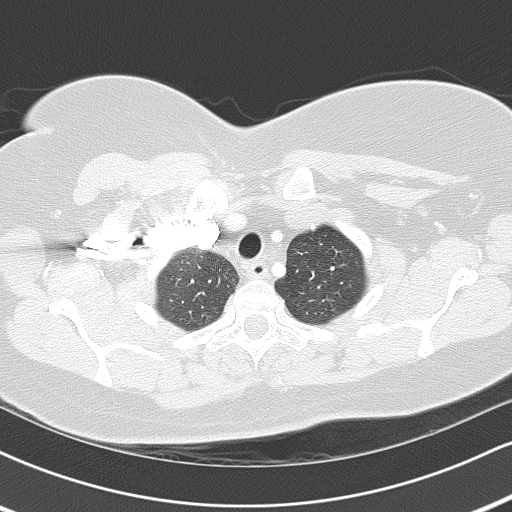
[im 73/80  mediastinal]
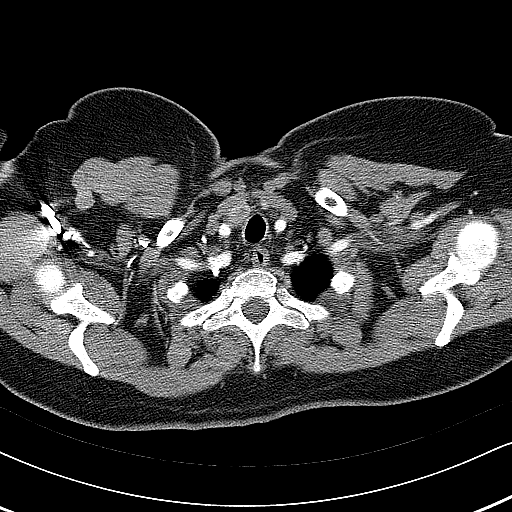
[im 73/80  lung]
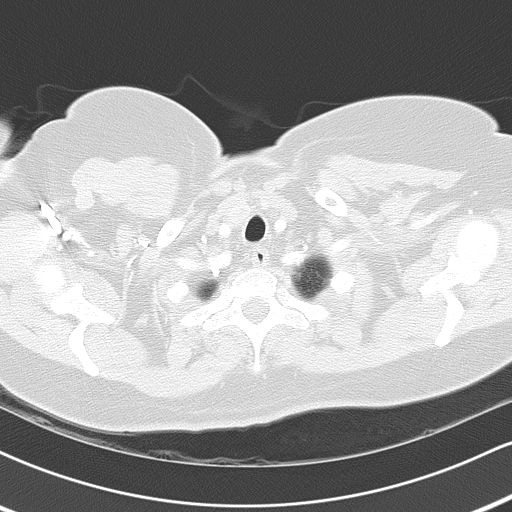

[16 of 31 positions shown; findings below may reference images not displayed]

IMPRESSION: 1. RIGHT lower lobe infiltrate consistent with pneumonia.

2. No PE.

## 2010-03-13 IMAGING — US US THYROID
1 series · 17 of 25 positions shown · non-contrast
Comparison: none

REASON FOR EXAM: ?lesion noted on CT scan
COMMENTS:

[Series 1: us thyroid · 17 of 30 slices shown]
[im 1/30]
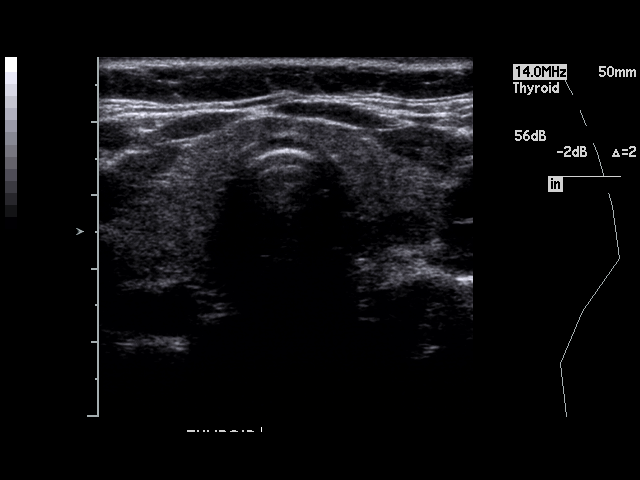
[im 3/30]
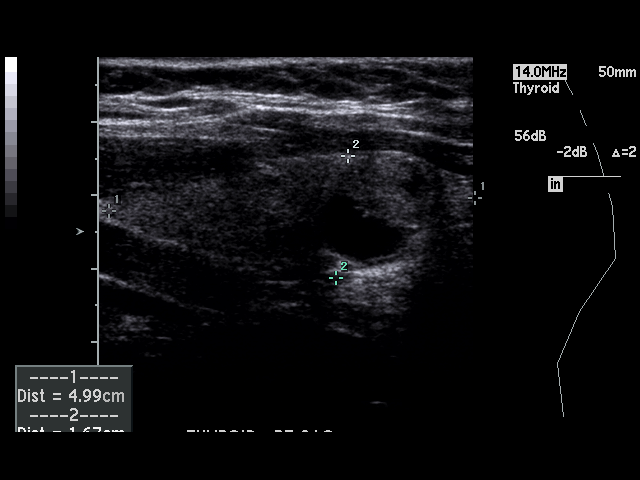
[im 4/30]
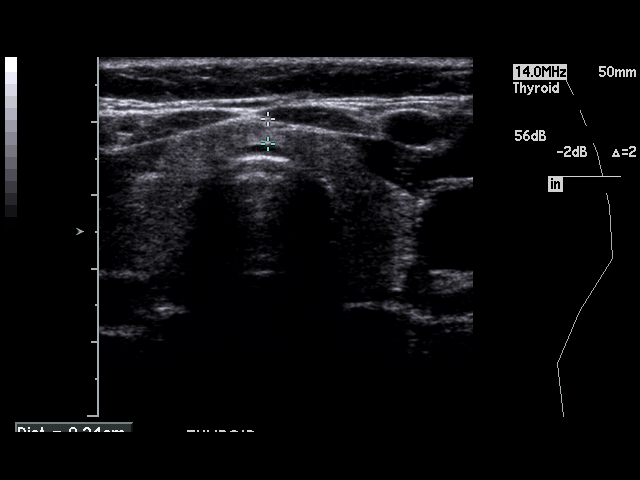
[im 7/30]
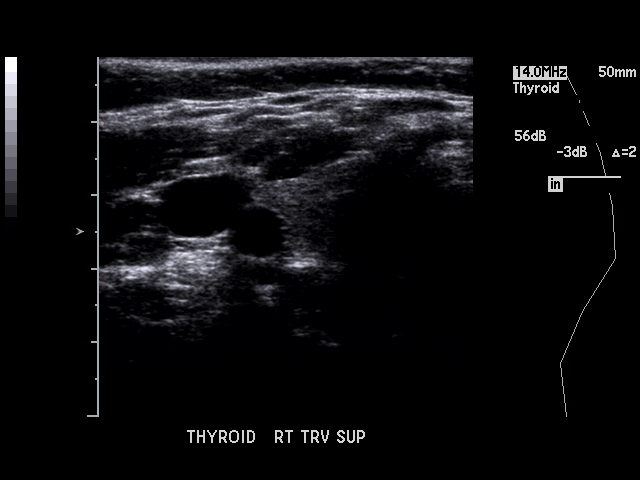
[im 8/30]
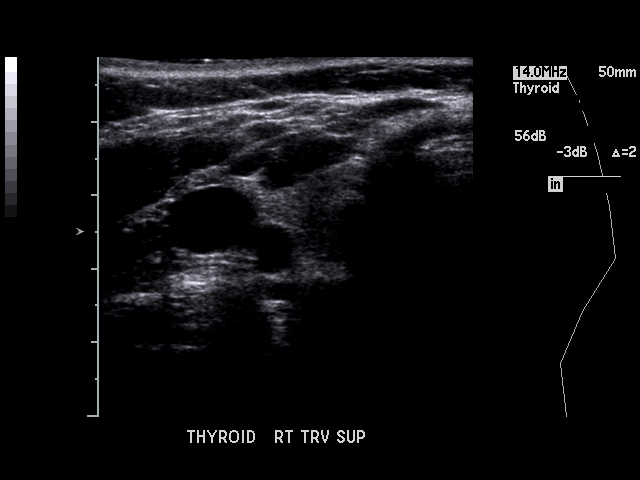
[im 10/30]
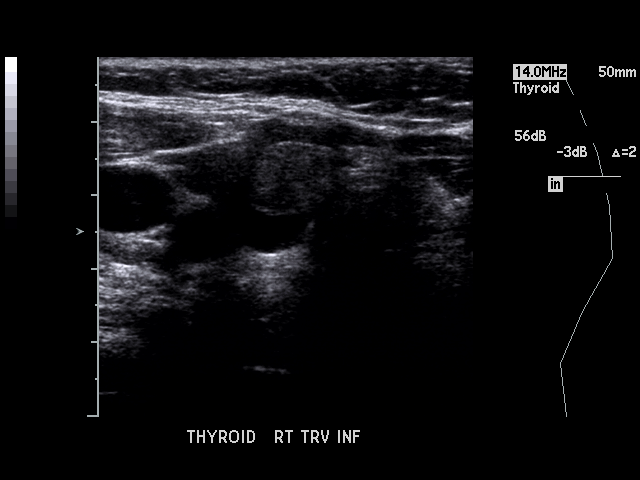
[im 11/30]
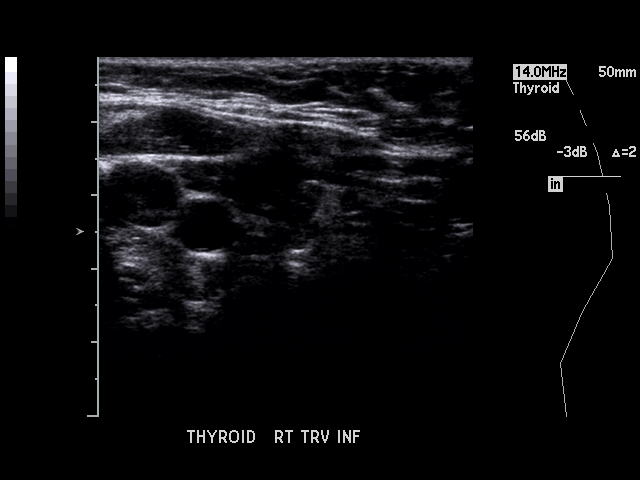
[im 14/30]
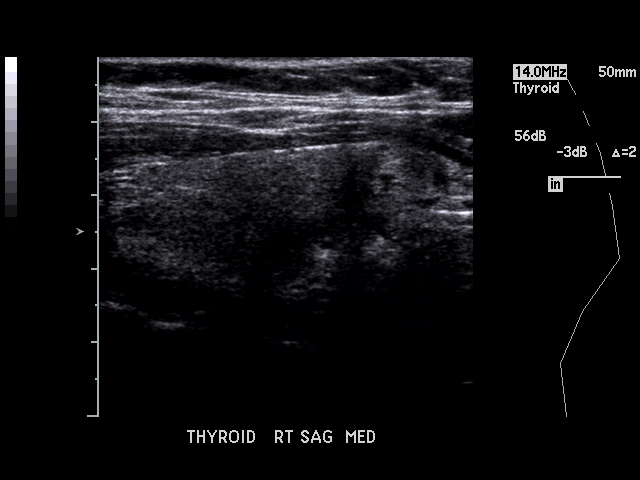
[im 15/30]
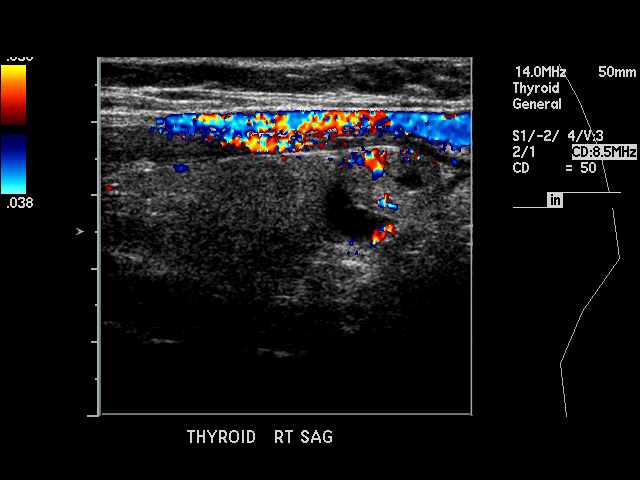
[im 16/30]
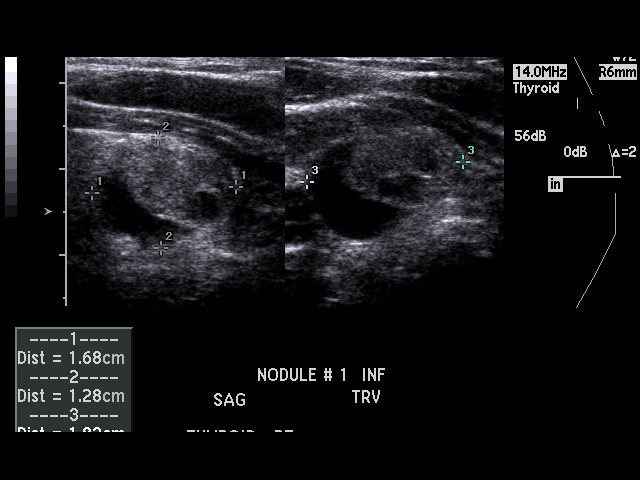
[im 19/30]
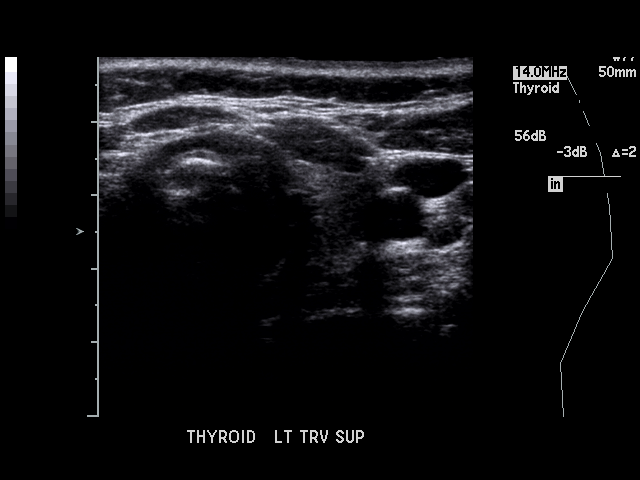
[im 20/30]
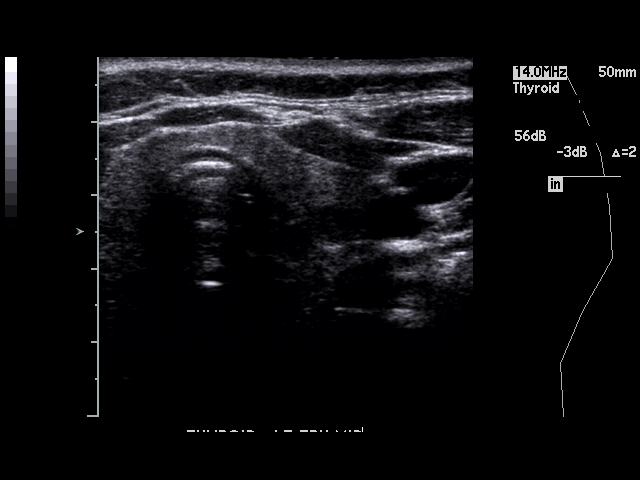
[im 22/30]
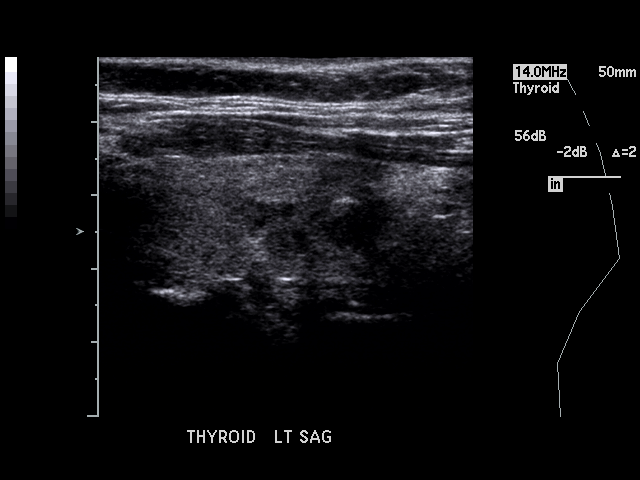
[im 23/30]
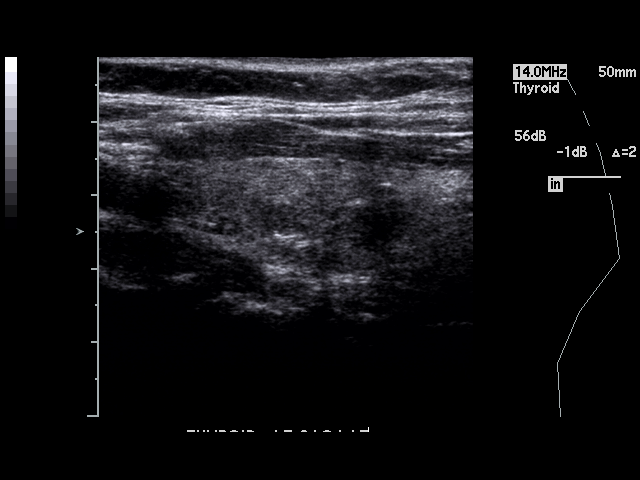
[im 26/30]
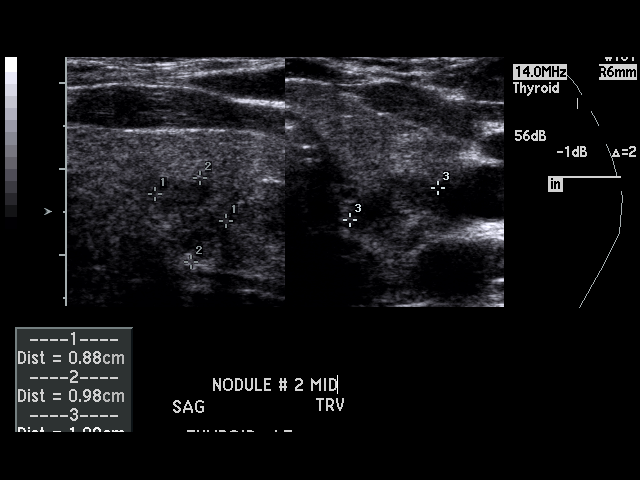
[im 27/30]
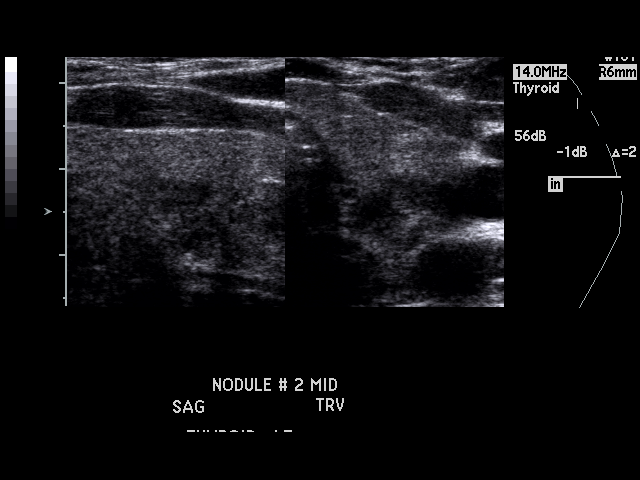
[im 30/30]
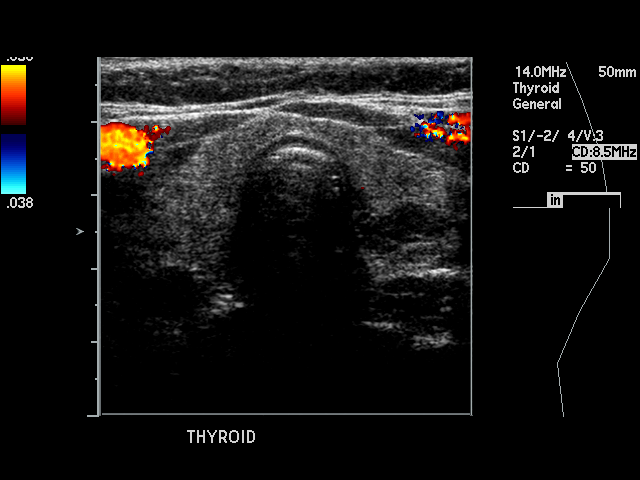

[17 of 25 positions shown; findings below may reference images not displayed]

PROCEDURE:          US THYROID 12/26/2008 [DATE]

RESULT:     Ultrasound of the thyroid is performed. The patient has no
previous exam for comparison. The right lobe measures 1.34 x 1.61 x 4.99 cm.
The left lobe measures 1.32 x 1.59 x 4.87 cm. The right lobe demonstrates a
complex mass in the lower pole region measuring 1.68 x 1.28 x 1.83 cm
containing cystic and solid components. No calcification is evident. The
left lobe shows nodularity with a hypoechoic nature in the mid and inferior
regions. These are less than 10 mm in size in the inferior pole region and
in the midpole region measuring up to approximately 11 mm in size. Again, no
calcification is evident.
IMPRESSION: No evidence of thyromegaly. Findings suggest possible
multinodular thyroid or goiter. No definite malignant findings are seen.
Close clinical and laboratory correlation and follow-up is recommended.

## 2010-03-13 IMAGING — CT CT ABD-PELV W/ CM
1 of 2 series · 15 of 32 positions shown, 19 images · non-contrast
Comparison: none

REASON FOR EXAM: (1) diverticulitis; (2) LLQ pain
COMMENTS:

[Series 2: soft tissue · axial · 0.72mm/px · z∈[-1296,-882]mm · 15 of 150 slices shown, 19 images]
[im 6/150  soft-tissue]
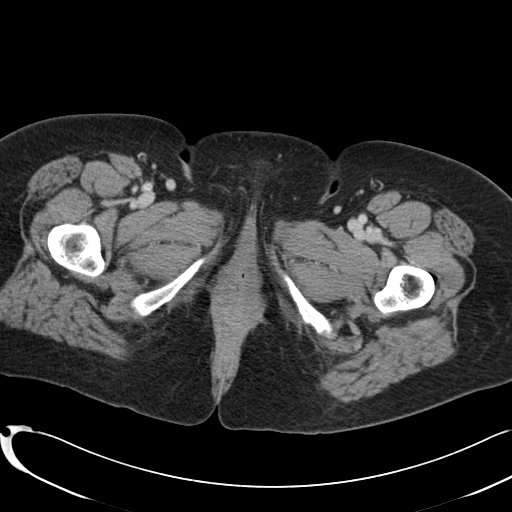
[im 6/150  bone]
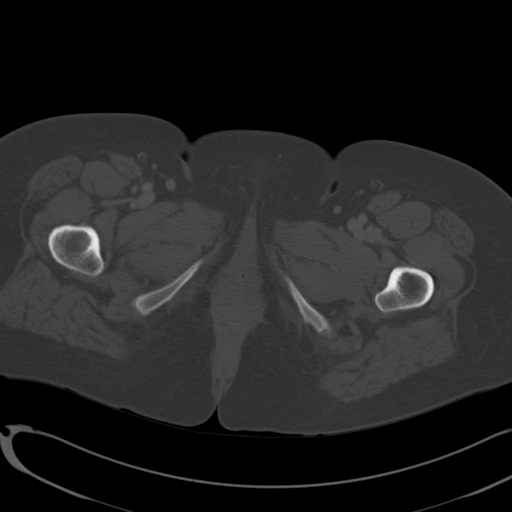
[im 18/150  soft-tissue]
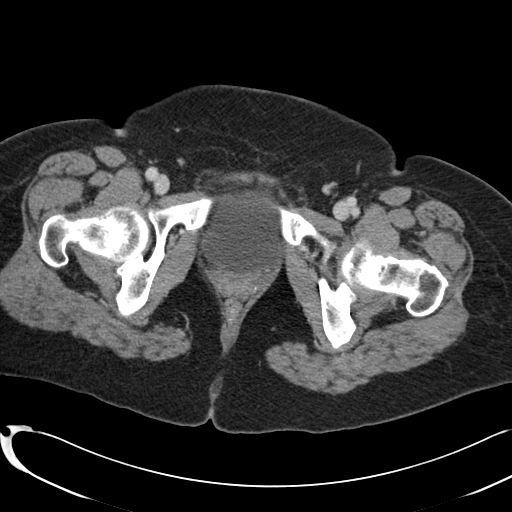
[im 29/150  soft-tissue]
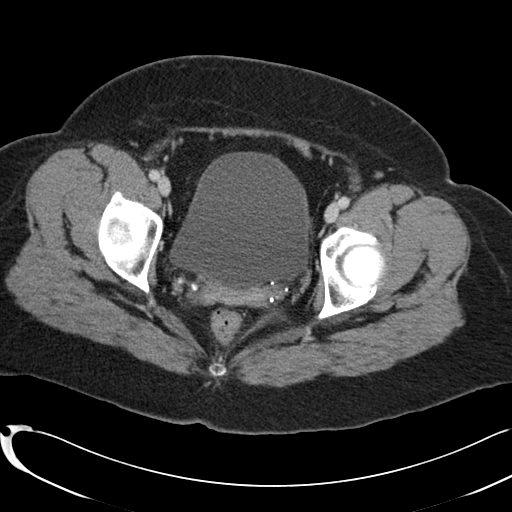
[im 41/150  soft-tissue]
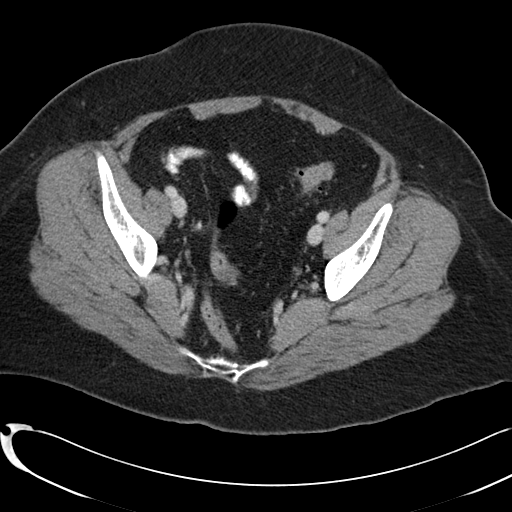
[im 52/150  soft-tissue]
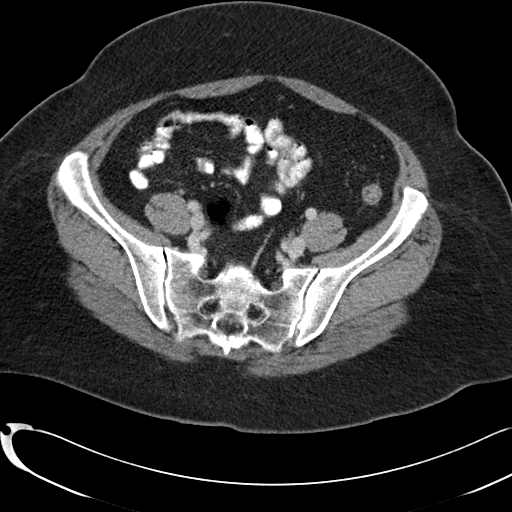
[im 64/150  soft-tissue]
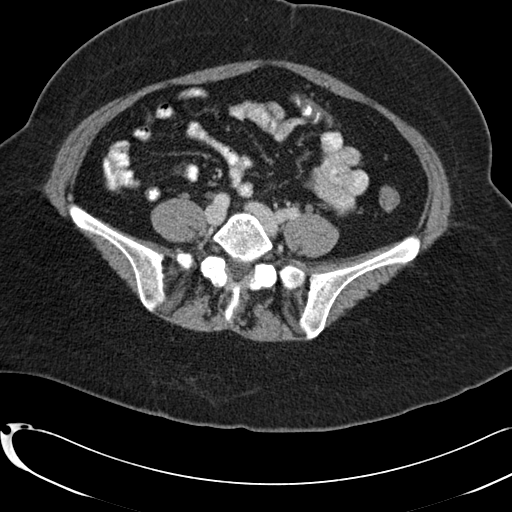
[im 75/150  soft-tissue]
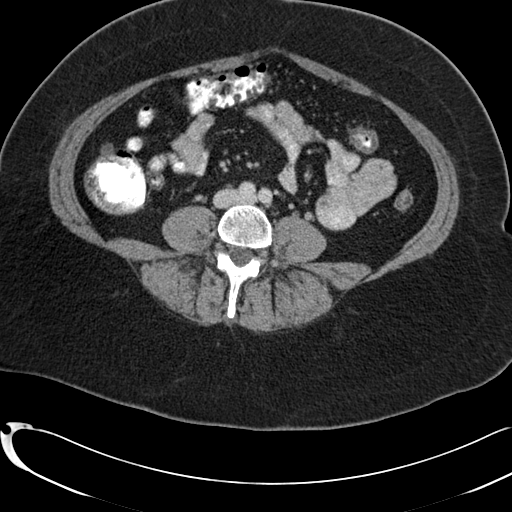
[im 86/150  soft-tissue]
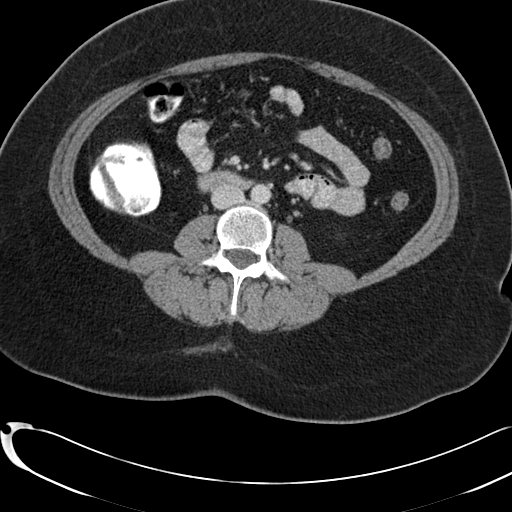
[im 98/150  soft-tissue]
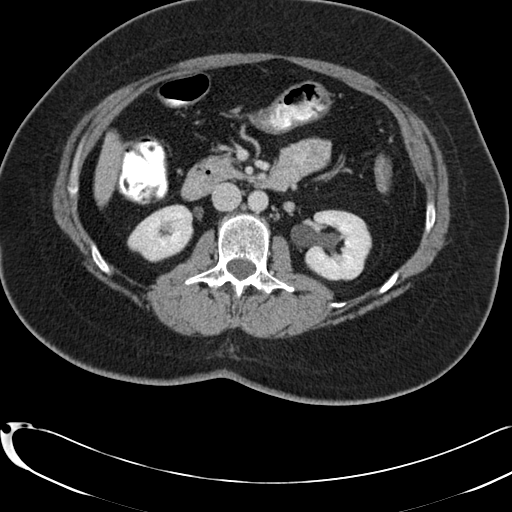
[im 98/150  bone]
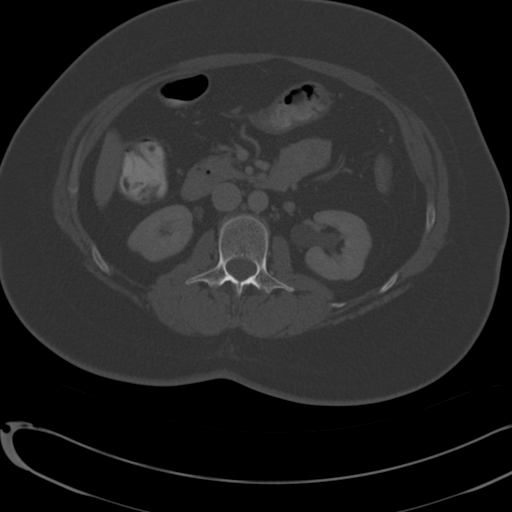
[im 109/150  soft-tissue]
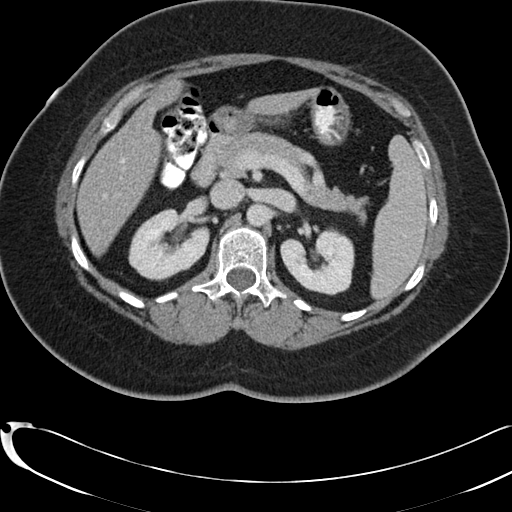
[im 121/150  soft-tissue]
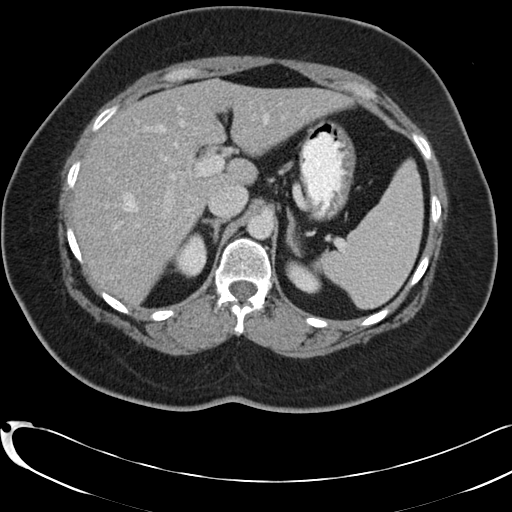
[im 127/150  lung]
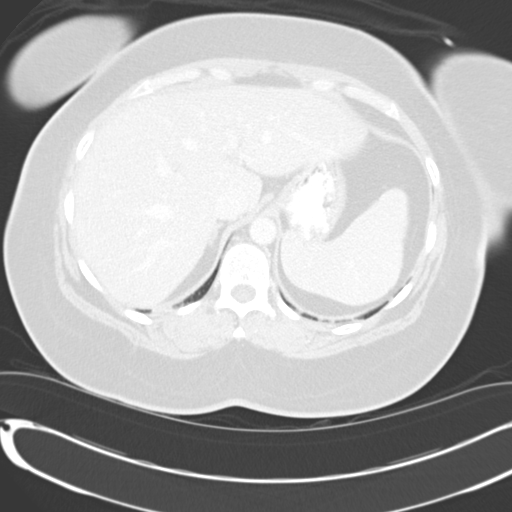
[im 132/150  soft-tissue]
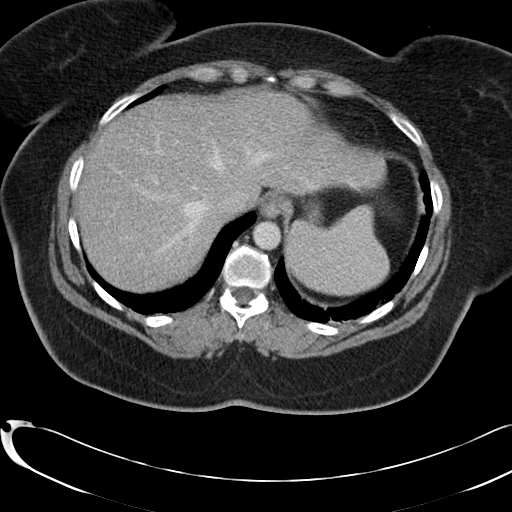
[im 132/150  lung]
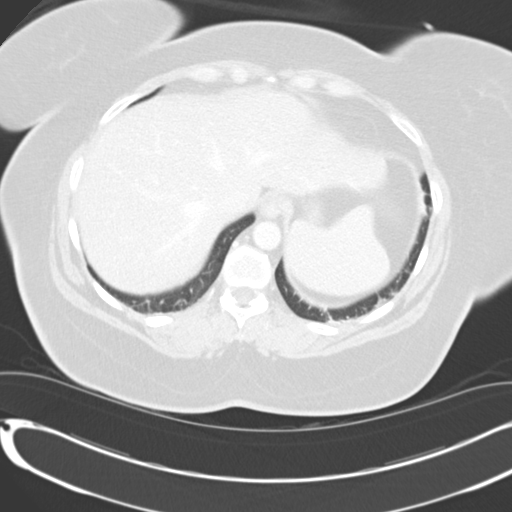
[im 138/150  lung]
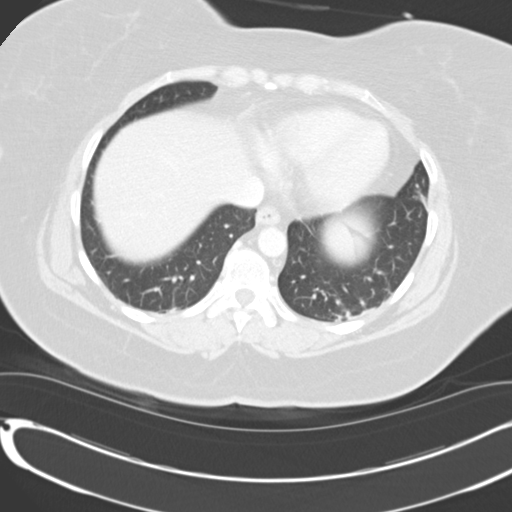
[im 144/150  soft-tissue]
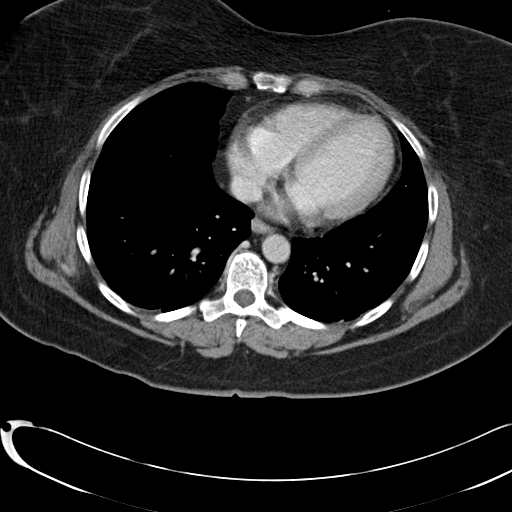
[im 144/150  lung]
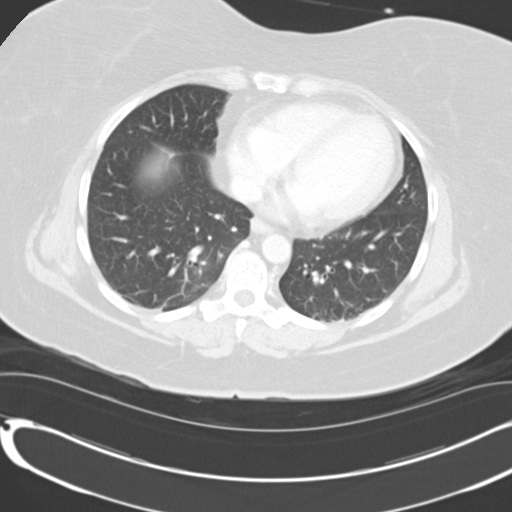

[15 of 32 positions shown; findings below may reference images not displayed]

PROCEDURE:     CT  - CT ABDOMEN / PELVIS  W  - December 26, 2008  [DATE]

RESULT:     Helical 3 mm sections were obtained from the lung bases through
the pubic symphysis status post intravenous administration of 100 ml of
Isovue 370 and oral contrast. This study was compared to previous study
dated 09-11-07.

Evaluation of the lung bases demonstrates hypoventilation and linear areas
of atelectasis and/or scarring.

The liver, spleen, adrenals, pancreas and kidneys are unremarkable. There is
no evidence of abdominal or pelvic free fluid, drainable loculated fluid
collections, masses or adenopathy. Diverticulosis is identified within the
sigmoid colon. There is no CT evidence of bowel obstruction or secondary
signs reflecting enteritis, colitis, diverticulitis nor appendicitis.  There
is no evidence of abdominal aortic aneurysm.  Incidental note is made of
bilateral extrarenal pelves, LEFT greater than RIGHT.
IMPRESSION: 1. No CT evidence of focal or acute intraabdominal or intrapelvic
abnormalities.

2. Diverticulosis in the sigmoid colon.

A preliminary faxed report was relayed on 12-26-08 at [DATE] p.m. Central
Standard Time.

## 2010-03-13 IMAGING — CR DG CHEST 2V
1 series · 2 of 2 positions shown · non-contrast
Comparison: none

REASON FOR EXAM: sob and cp
COMMENTS:

[Series 1: view not recorded · 0.17mm/px · 2 of 2 slices shown]
[im 1/2]
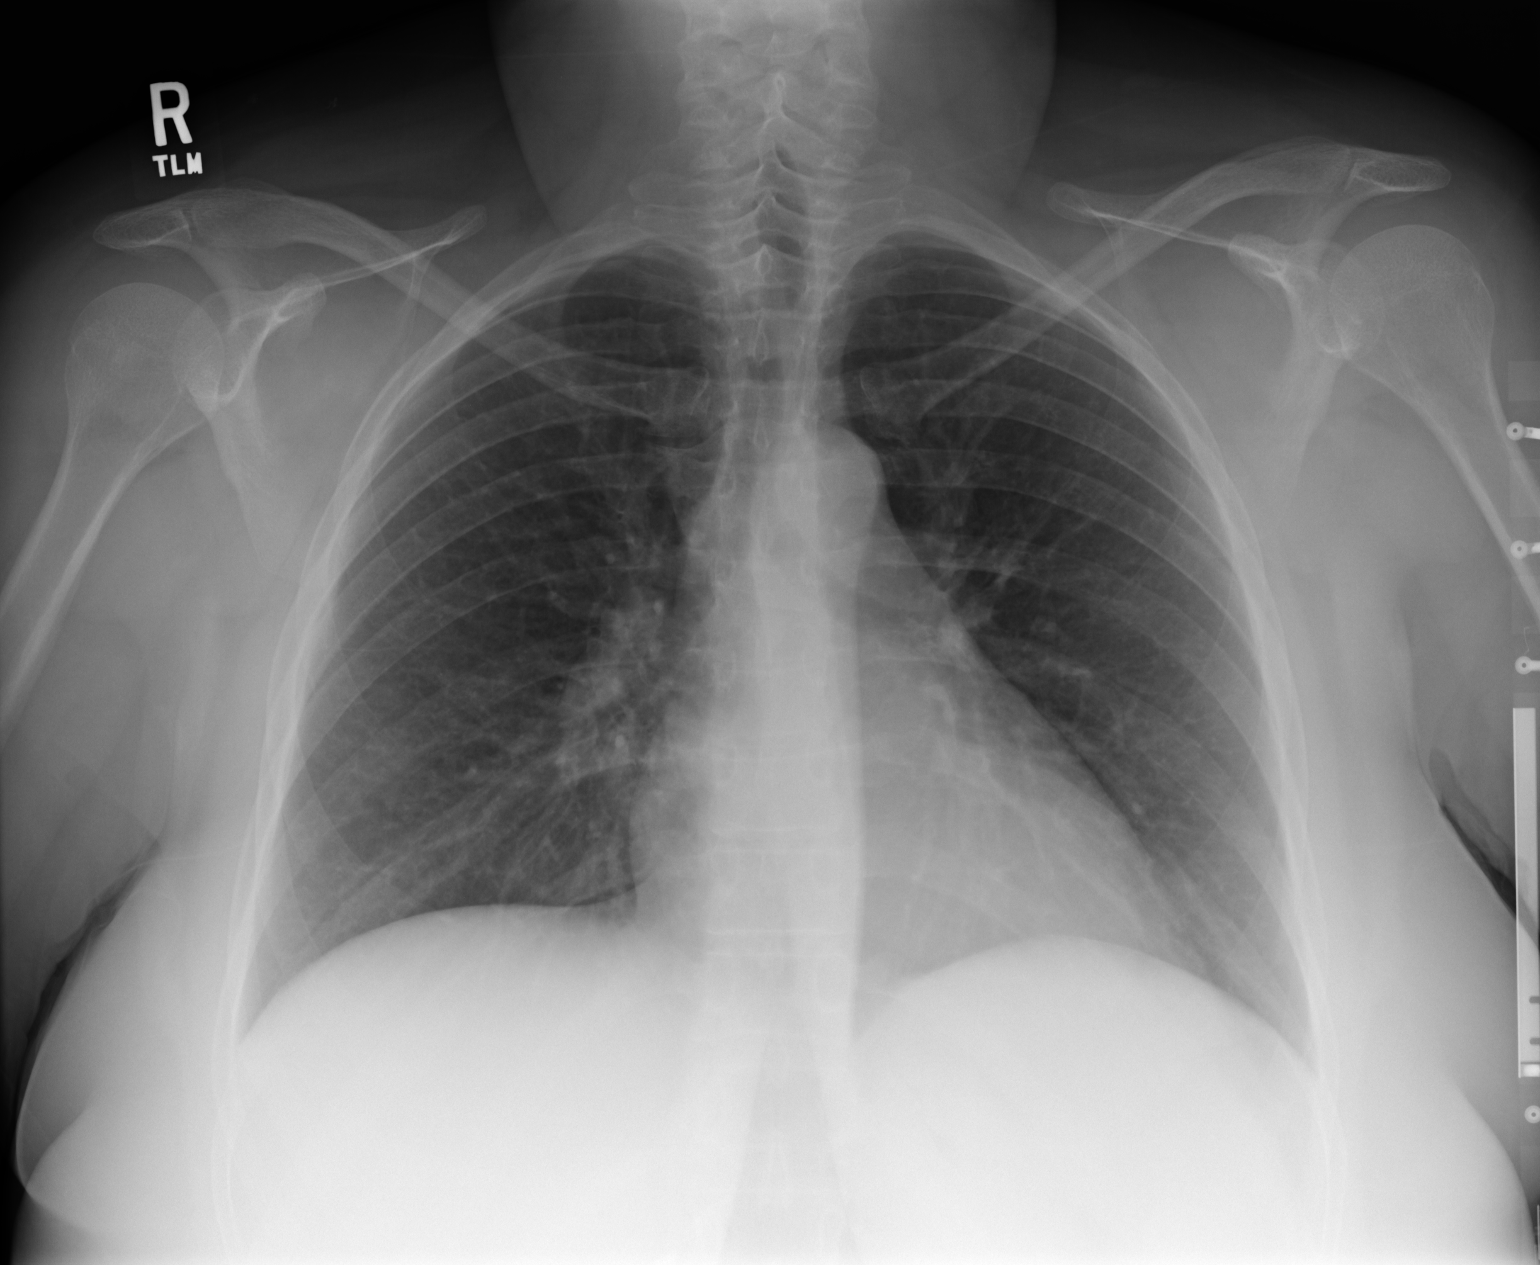
[im 2/2]
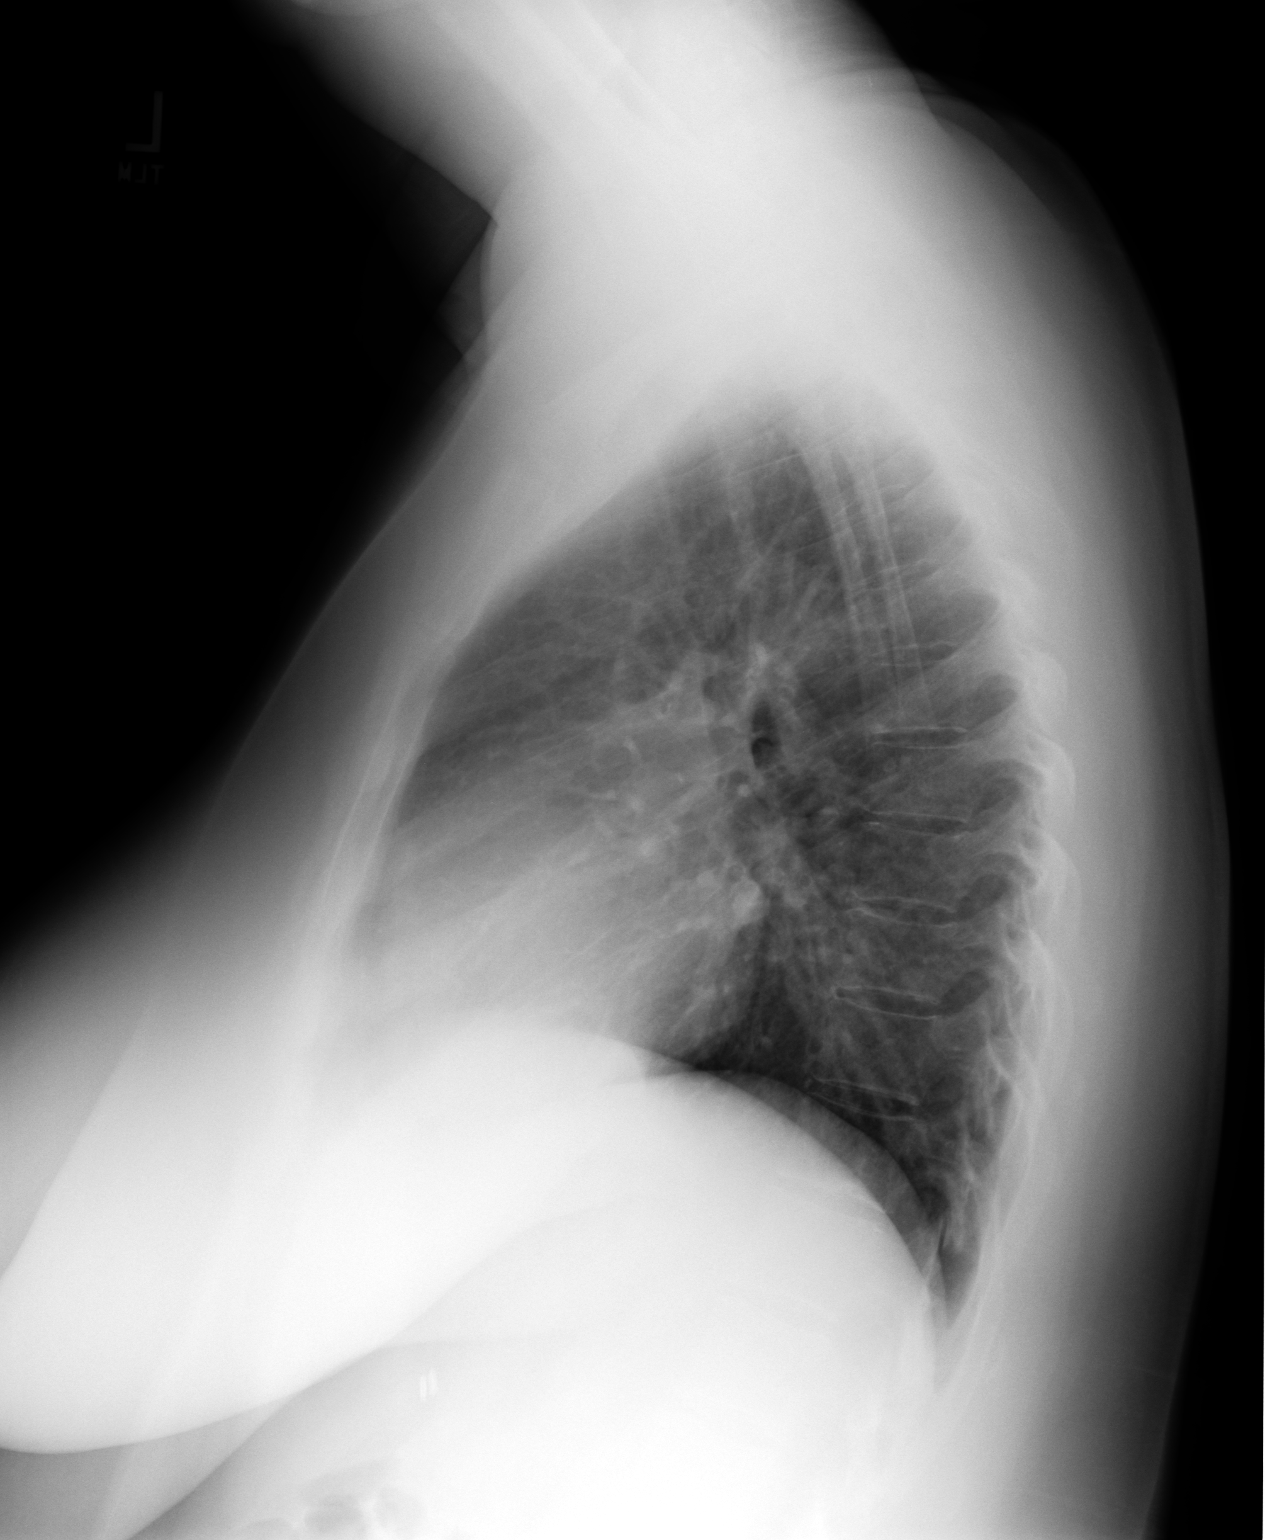

[2 of 2 positions shown; findings below may reference images not displayed]

PROCEDURE:     DXR - DXR CHEST PA (OR AP) AND LATERAL  - December 26, 2008  [DATE]

RESULT:     The lungs are adequately inflated. There is no focal infiltrate.
The cardiac silhouette is top normal in size. The central pulmonary
vascularity is minimally prominent. The trachea is midline. The mediastinum
is not widened. There is no pleural effusion.
IMPRESSION: I do not see evidence of pneumonia nor definite evidence of
other acute cardiopulmonary abnormality. There are mildly increased
perihilar lung markings are noted but these are nonspecific. Followup films
are recommended if the patient's symptoms persist.

## 2010-04-14 IMAGING — RF DG BARIUM ENEMA
1 series · 14 of 14 positions shown · non-contrast
Comparison: none

REASON FOR EXAM: abd pain    diverticulitis
COMMENTS:

PROCEDURE:     FL  - FL BARIUM ENEMA (COLON)  - January 27, 2009 [DATE]
RESULT:     Barium Enema was performed and reveals multiple sigmoid
diverticula. No mass lesion is noted. The terminal ileum is normal. No other
abnormality is identified.

[Series 1: run · 14 of 14 slices shown]
[im 1/14]
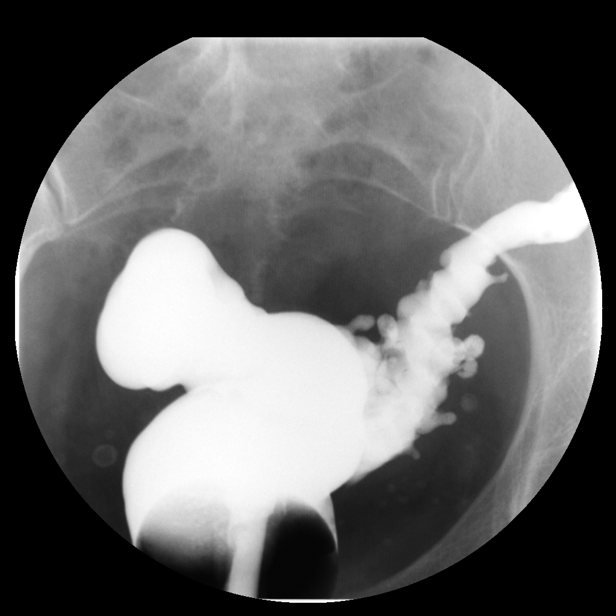
[im 2/14]
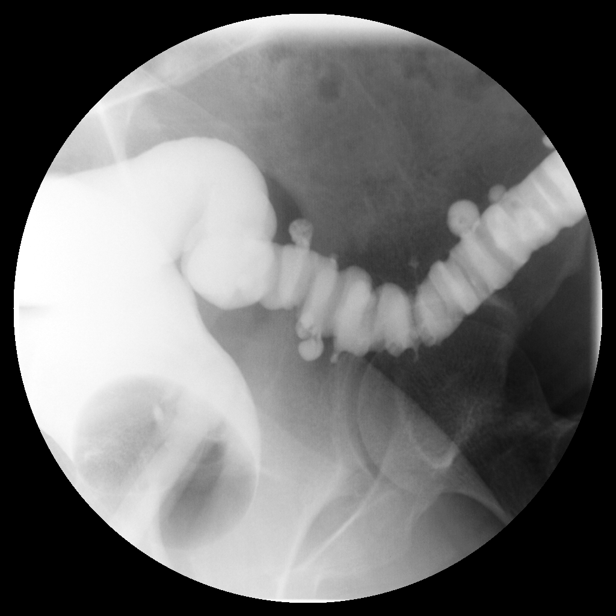
[im 3/14]
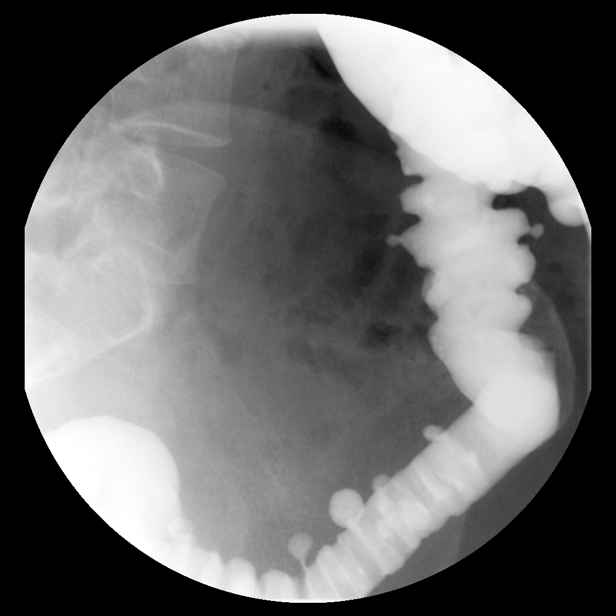
[im 4/14]
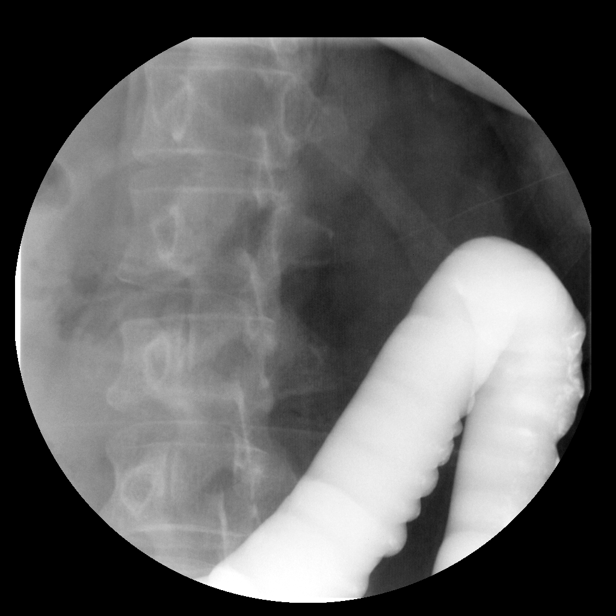
[im 5/14]
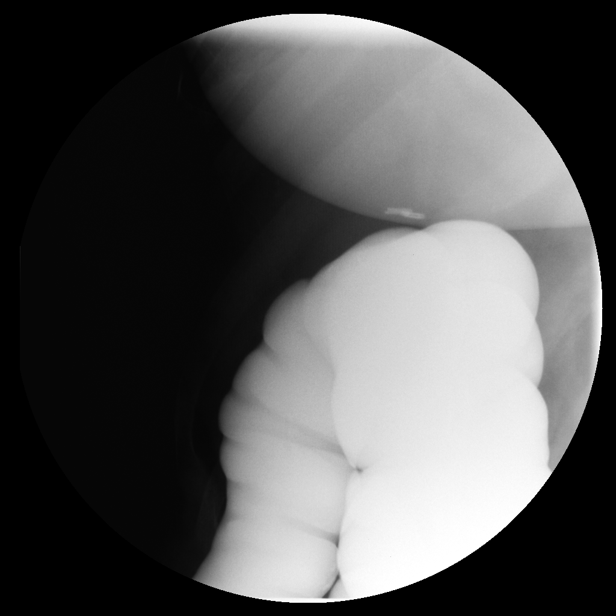
[im 6/14]
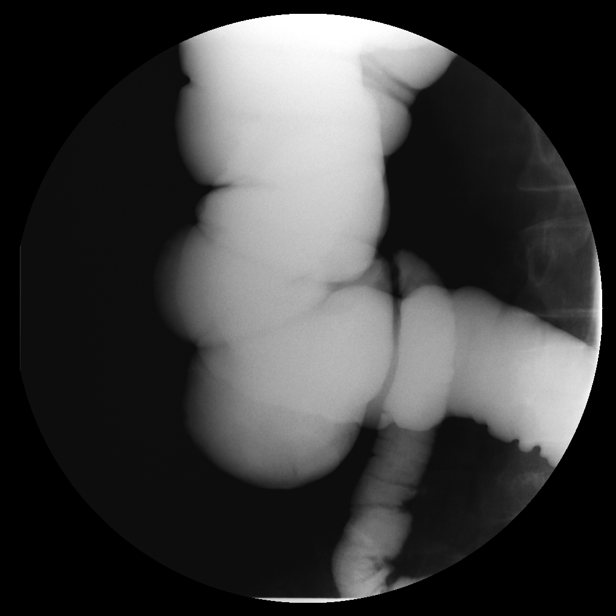
[im 7/14]
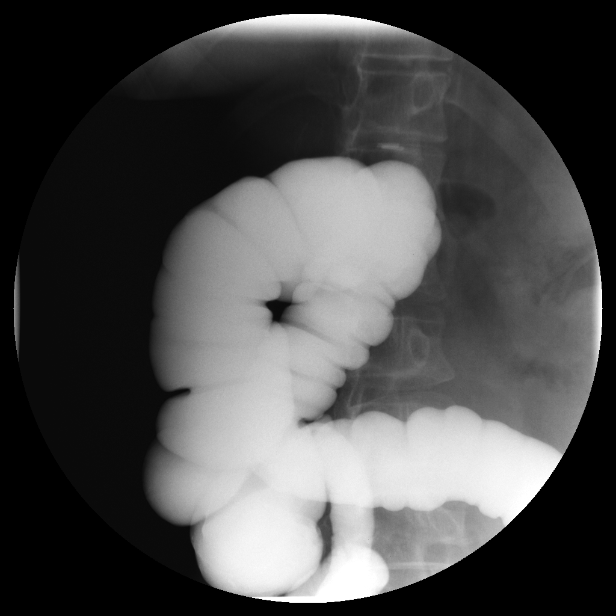
[im 8/14]
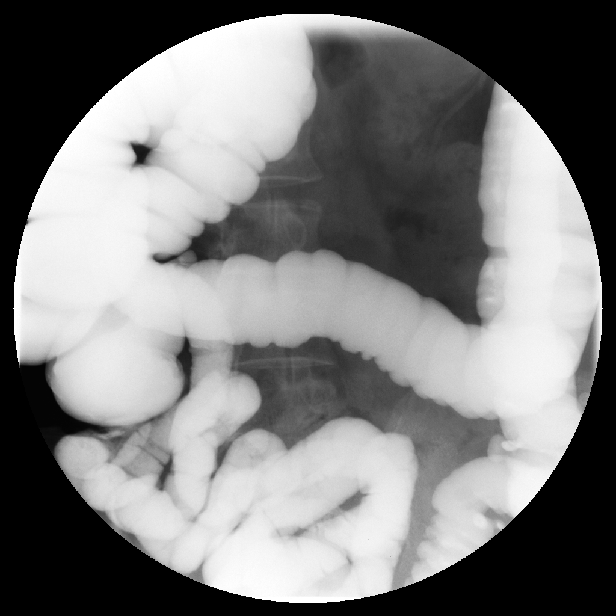
[im 9/14]
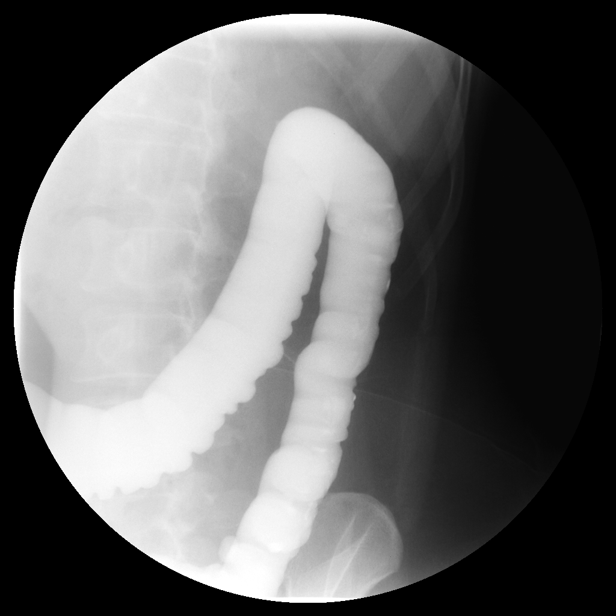
[im 10/14]
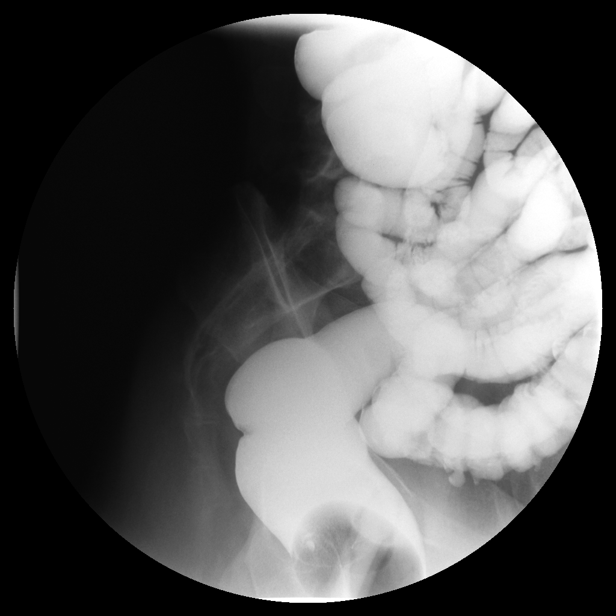
[im 11/14]
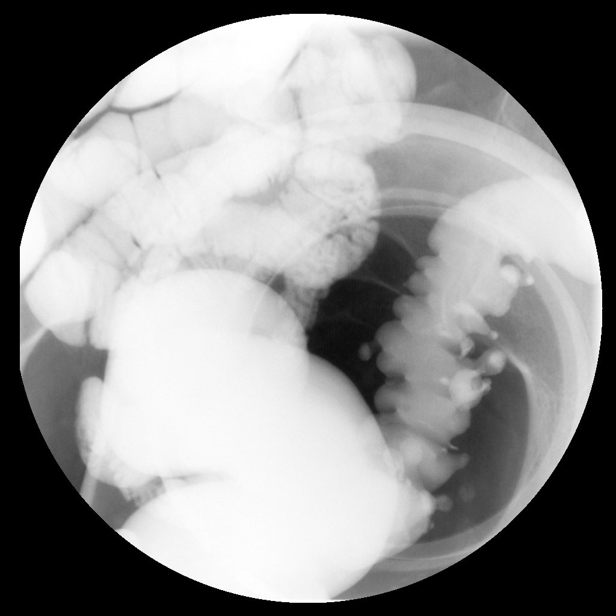
[im 12/14]
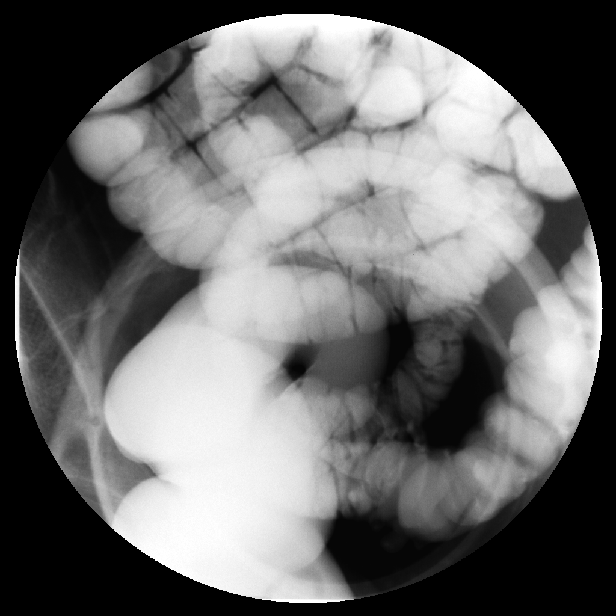
[im 13/14]
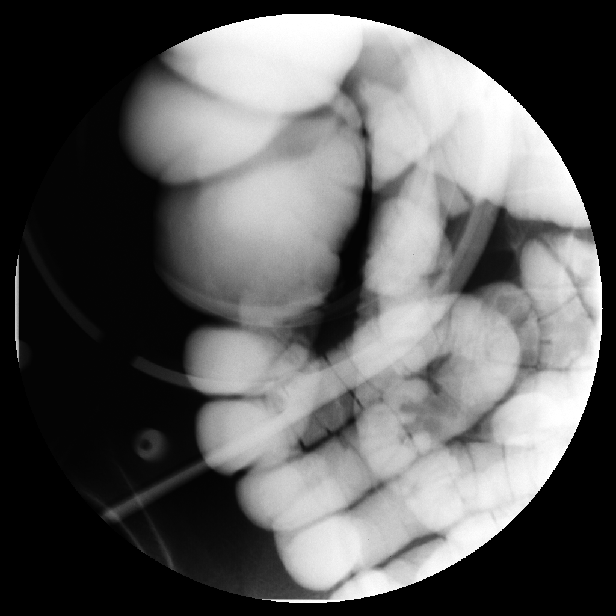
[im 14/14]
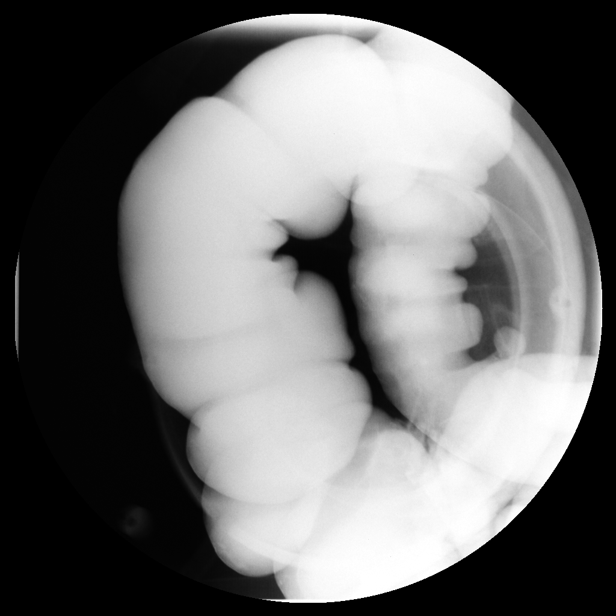

[14 of 14 positions shown; findings below may reference images not displayed]

IMPRESSION: Multiple sigmoid diverticula. There is no evidence of
diverticulitis. The exam is otherwise unremarkable. The terminal ileum is
normal.

## 2010-04-22 IMAGING — US US BIOPSY
1 series · 5 of 5 positions shown · non-contrast
Comparison: none

Clinical Data/Indication: Right thyroid mass

Ultrasound-guided biopsyof a right thyroid mass.  Fine needle
aspiration.
Procedure: The procedure, risks, benefits, and alternatives were
explained to the patient. Questions regarding the procedure were
encouraged and answered. The patient understands and consents to
the procedure.
The right neck was prepped with betadine in a sterile fashion, and
a sterile drape was applied covering the operative field. A sterile
gown and sterile gloves were used for the procedure.
Under sonographic guidance, three 25 gauge fine needle aspirates of
the right thyroid mass were obtained. The guide needle was removed.
Final imaging was performed.
Patient tolerated the procedure well without complication.  Vital
sign monitoring by nursing staff during the procedure will continue
as patient is in the special procedures unit for post procedure
observation.

[Series 1: us biopsy · 5 acquisitions, 5 frames shown]
[im 1/5]
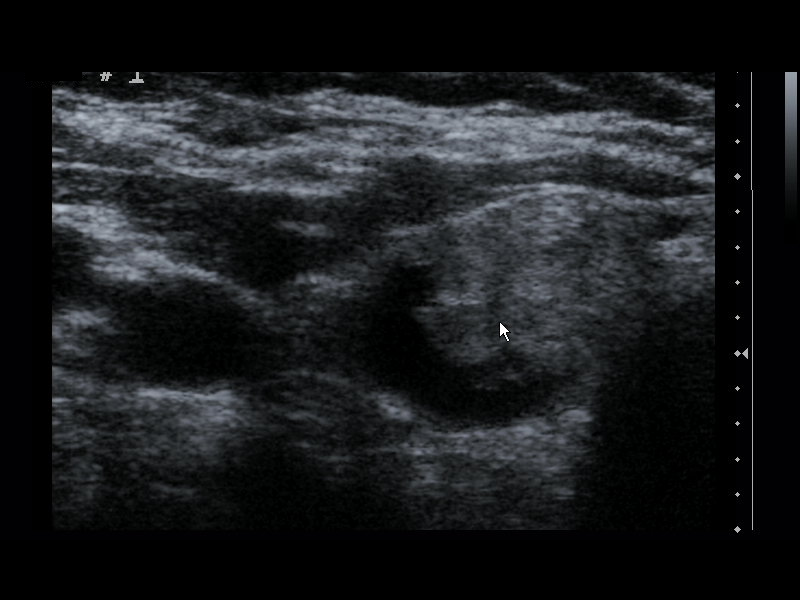
[im 2/5]
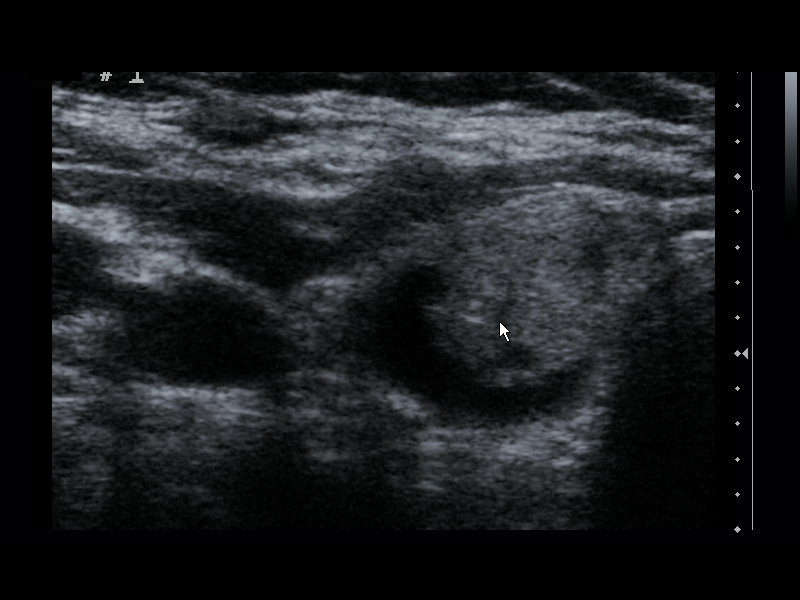
[im 3/5]
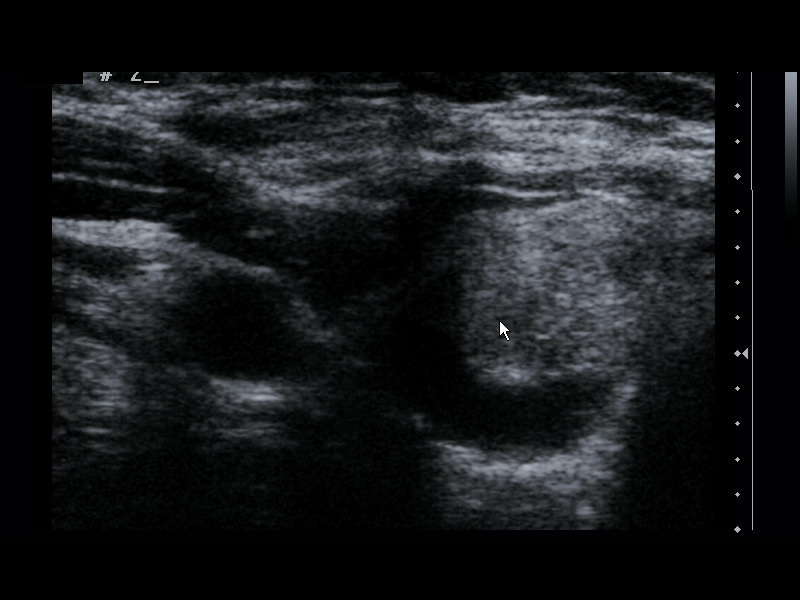
[im 4/5]
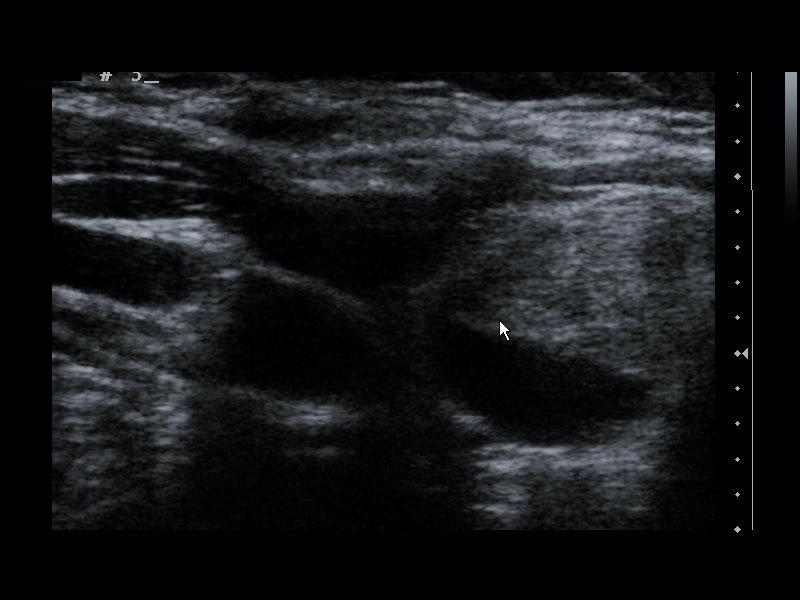
[im 5/5]
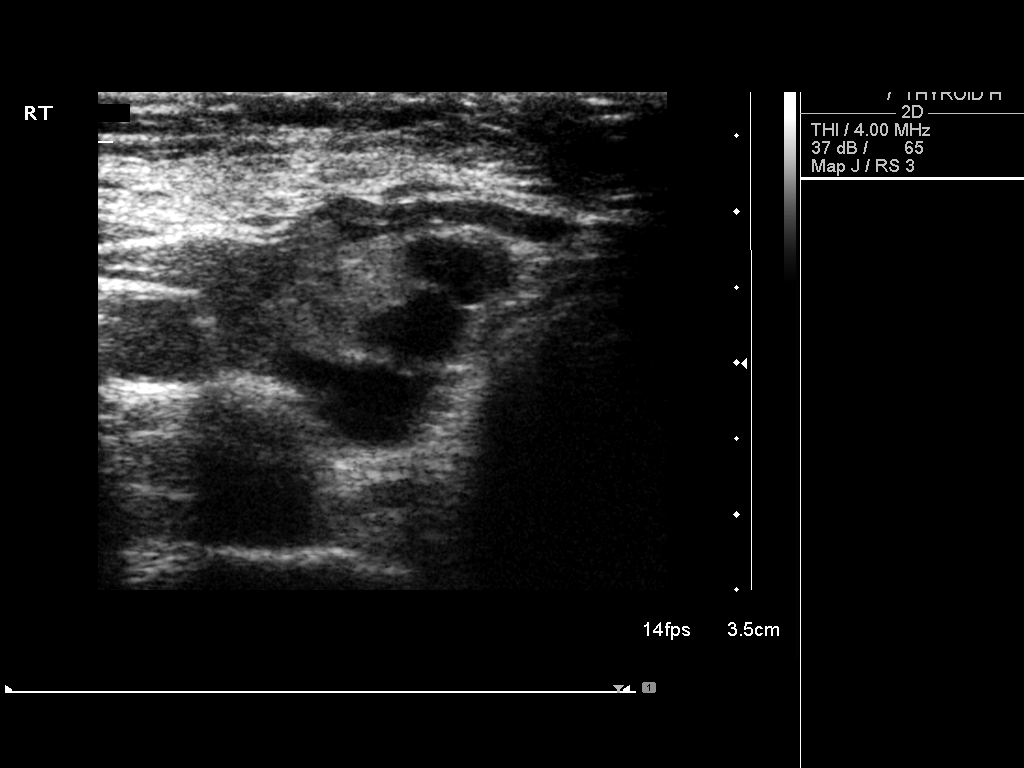

[5 of 5 positions shown; findings below may reference images not displayed]

FINDINGS: The images document guide needle placement within the
right thyroid mass. Post biopsy images demonstrate no hemorrhage.
IMPRESSION: Successful ultrasound-guided fine needle aspiration biopsy of a
right thyroid mass.

## 2010-05-24 IMAGING — CT CT ABD-PELV W/ CM
1 of 2 series · 14 of 32 positions shown, 18 images · non-contrast
Comparison: none

REASON FOR EXAM: (1) abd pain; (2) abd pain
COMMENTS:

[Series 2: abdomen · axial · 0.74mm/px · z∈[-434,-26]mm · 14 of 150 slices shown, 18 images]
[im 7/150  soft-tissue]
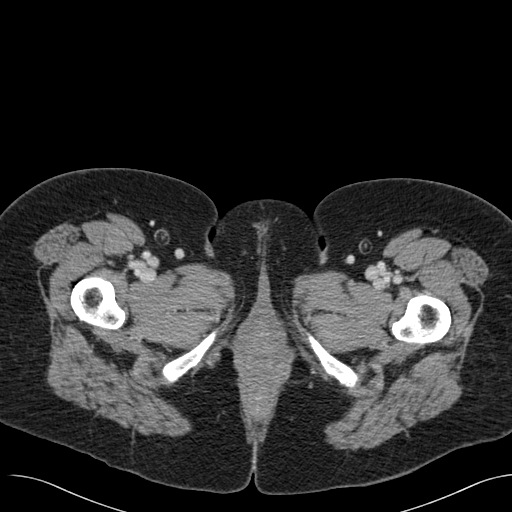
[im 7/150  bone]
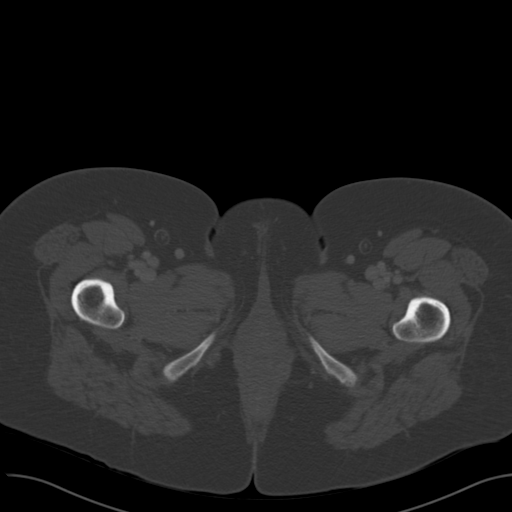
[im 19/150  soft-tissue]
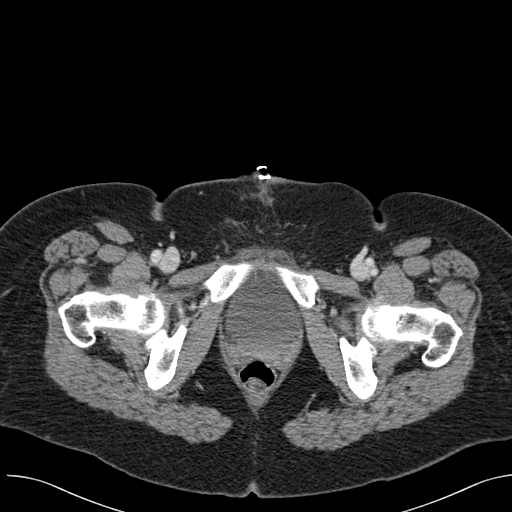
[im 32/150  soft-tissue]
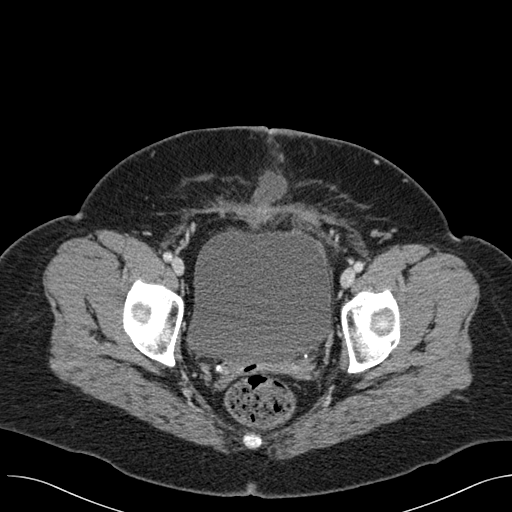
[im 44/150  soft-tissue]
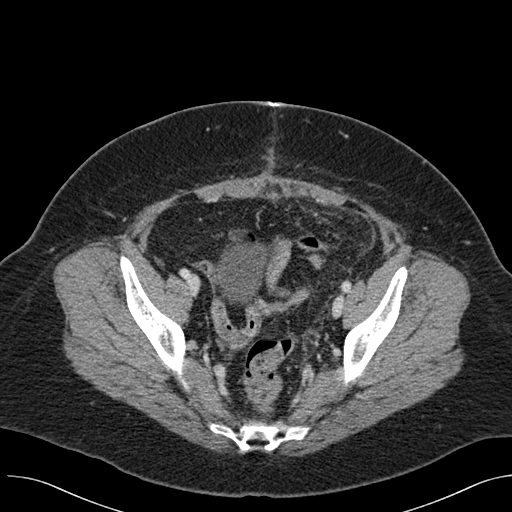
[im 56/150  soft-tissue]
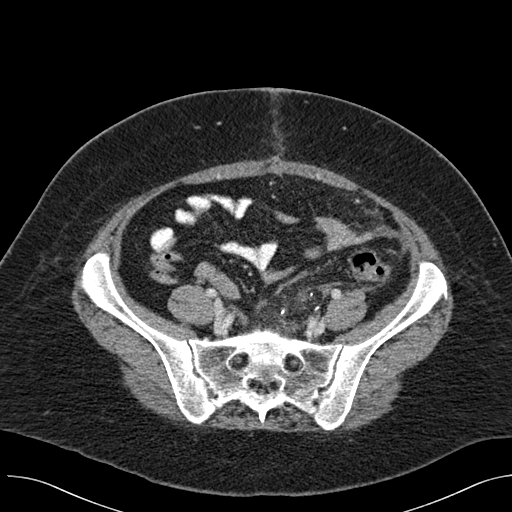
[im 69/150  soft-tissue]
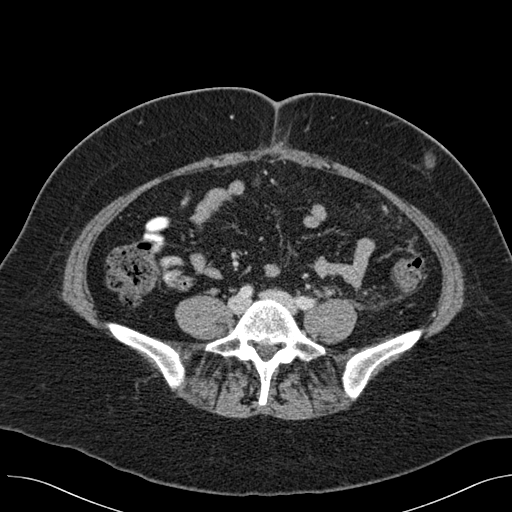
[im 81/150  soft-tissue]
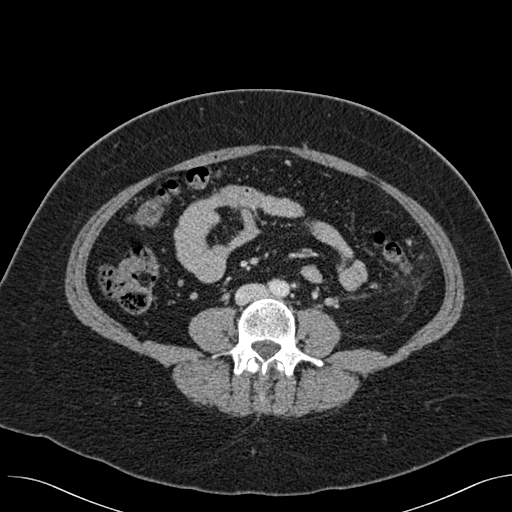
[im 94/150  soft-tissue]
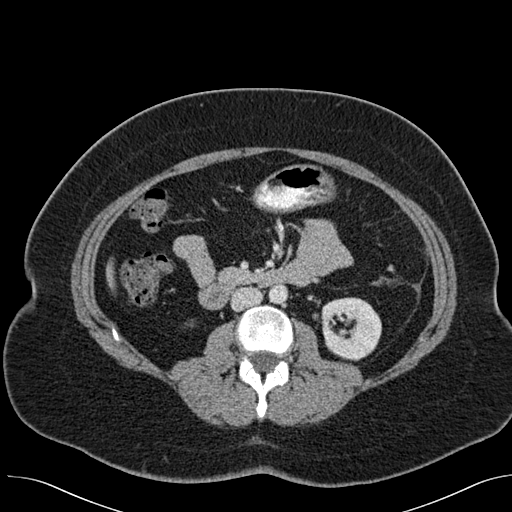
[im 106/150  soft-tissue]
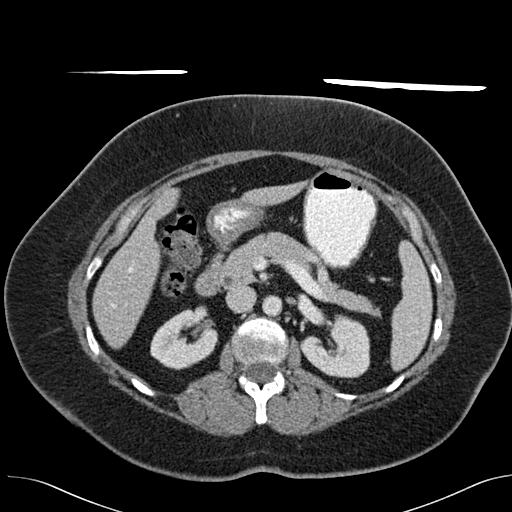
[im 106/150  bone]
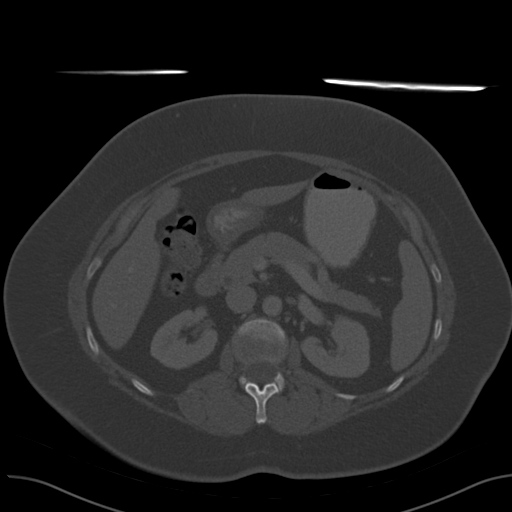
[im 118/150  soft-tissue]
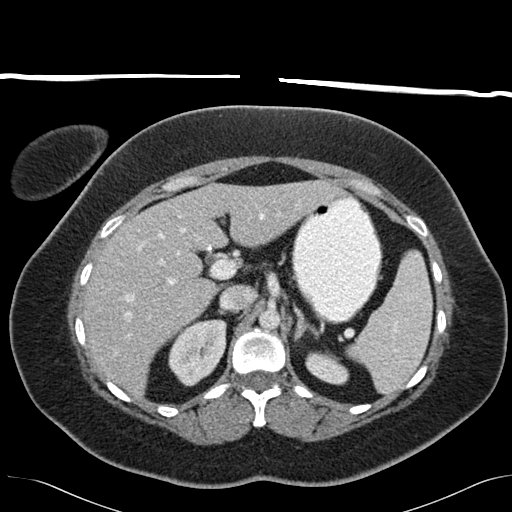
[im 125/150  lung]
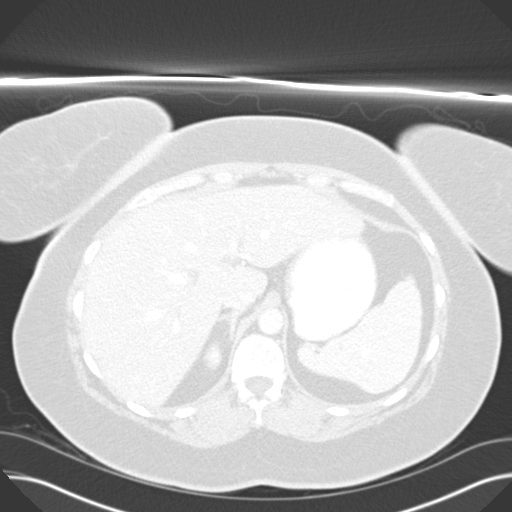
[im 131/150  soft-tissue]
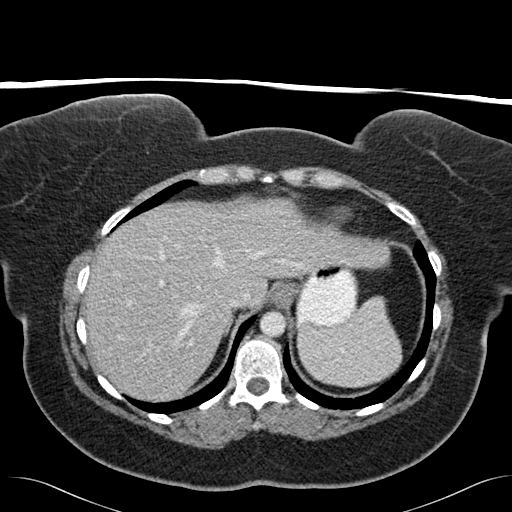
[im 131/150  lung]
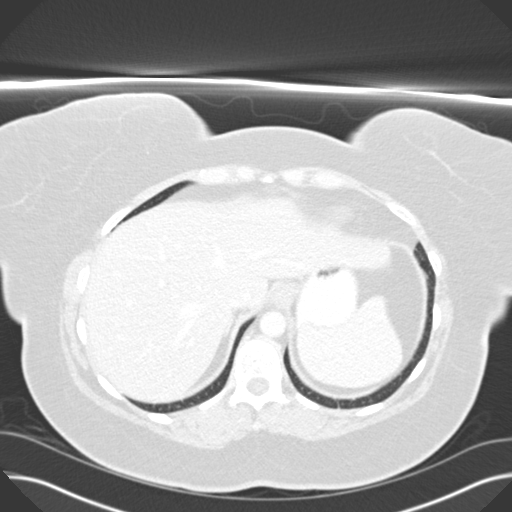
[im 137/150  lung]
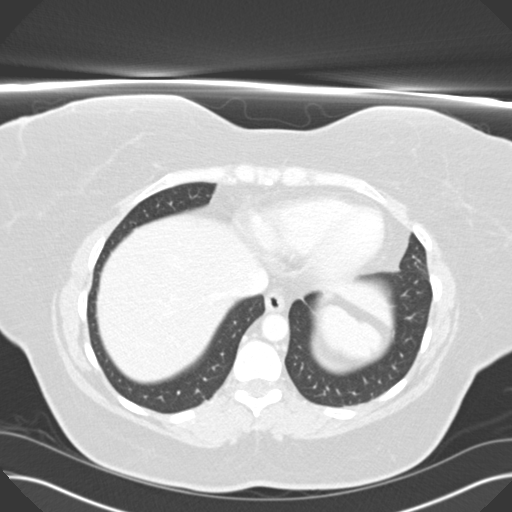
[im 143/150  soft-tissue]
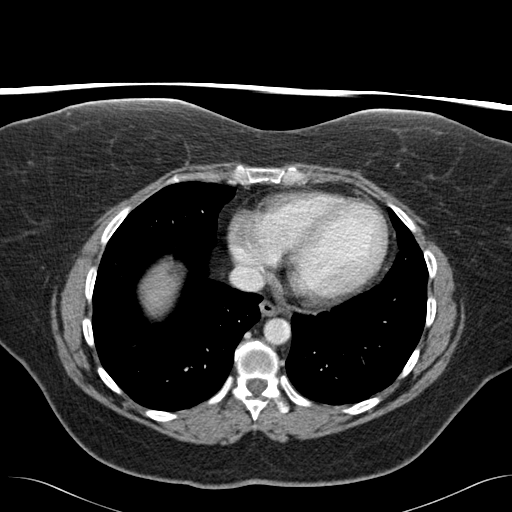
[im 143/150  lung]
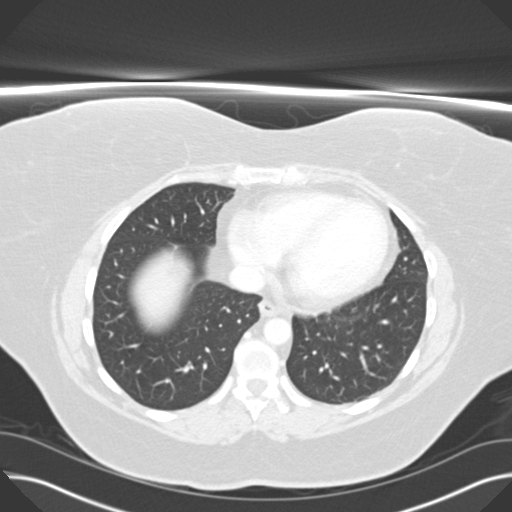

[14 of 32 positions shown; findings below may reference images not displayed]

PROCEDURE:     CT  - CT ABDOMEN / PELVIS  W  - March 08, 2009  [DATE]

RESULT:     Axial CT scanning was performed through the abdomen and pelvis
following intravenous administration of 100 cc of Lsovue-7CS as well as oral
contrast material. Comparison is made to a study dated 26 December, 2008 which
was a preoperative examination. The patient has recently undergone partial
colectomy for diverticulitis.

There is a ventral incision in the infraumbilical region. Within the
lowermost portion of the incision there is an approximately 2.5 x 3 cm by 4
cm fluid collection. No gas is seen within it. There is no definite
communication with bowel. Increased density in the perisigmoid fat is
demonstrated. There are surgical sutures in the region of the mid sigmoid
colon. There is no evidence of obstruction nor of abscess formation. No free
fluid is identified in the abdomen or pelvis. The urinary bladder is
moderately distended. The uterus is surgically absent. There are no adnexal
masses. There is stool in the rectum.

The orally administered contrast has traversed only a portion of the small
bowel. The ascending and transverse and descending portions of the colon
exhibit no evidence of obstruction nor other acute abnormality. The
gallbladder surgically absent. There is a trace of intrahepatic ductal
dilation. The liver otherwise appears normal. The spleen, partially
distended stomach, pancreas, adrenal glands, and kidneys are normal in
appearance. There is an extrarenal pelvis on the left. There are numerous
phleboliths present within the pelvis. There are surgical sutures anterior
to the lumbosacral junction. The lung bases are clear. The lumbar vertebral
bodies are preserved in height.
IMPRESSION: 1. In the surgical site in the region of the sigmoid colon there are
postsurgical changes but and there is no evidence of obstruction or abscess
formation nor free fluid or free air.
2. There is a small fluid collection in the lower most portion of the
vertical ventral incision. This may reflect a seroma or hematoma. It does
not appear to clearly communicate with bowel. Abscess is felt to be less
likely but is not entirely excluded.
3. There is an extrarenal pelvis on the left. I do not see evidence of
obstruction.

A preliminary report was sent to the [HOSPITAL] the conclusion
of the study.

## 2010-05-24 IMAGING — CR DG ABDOMEN 3V
1 series · 4 of 4 positions shown · non-contrast
Comparison: none

REASON FOR EXAM: abd pain
COMMENTS:

[Series 1: view not recorded · 0.17mm/px · 4 of 4 slices shown]
[im 1/4]
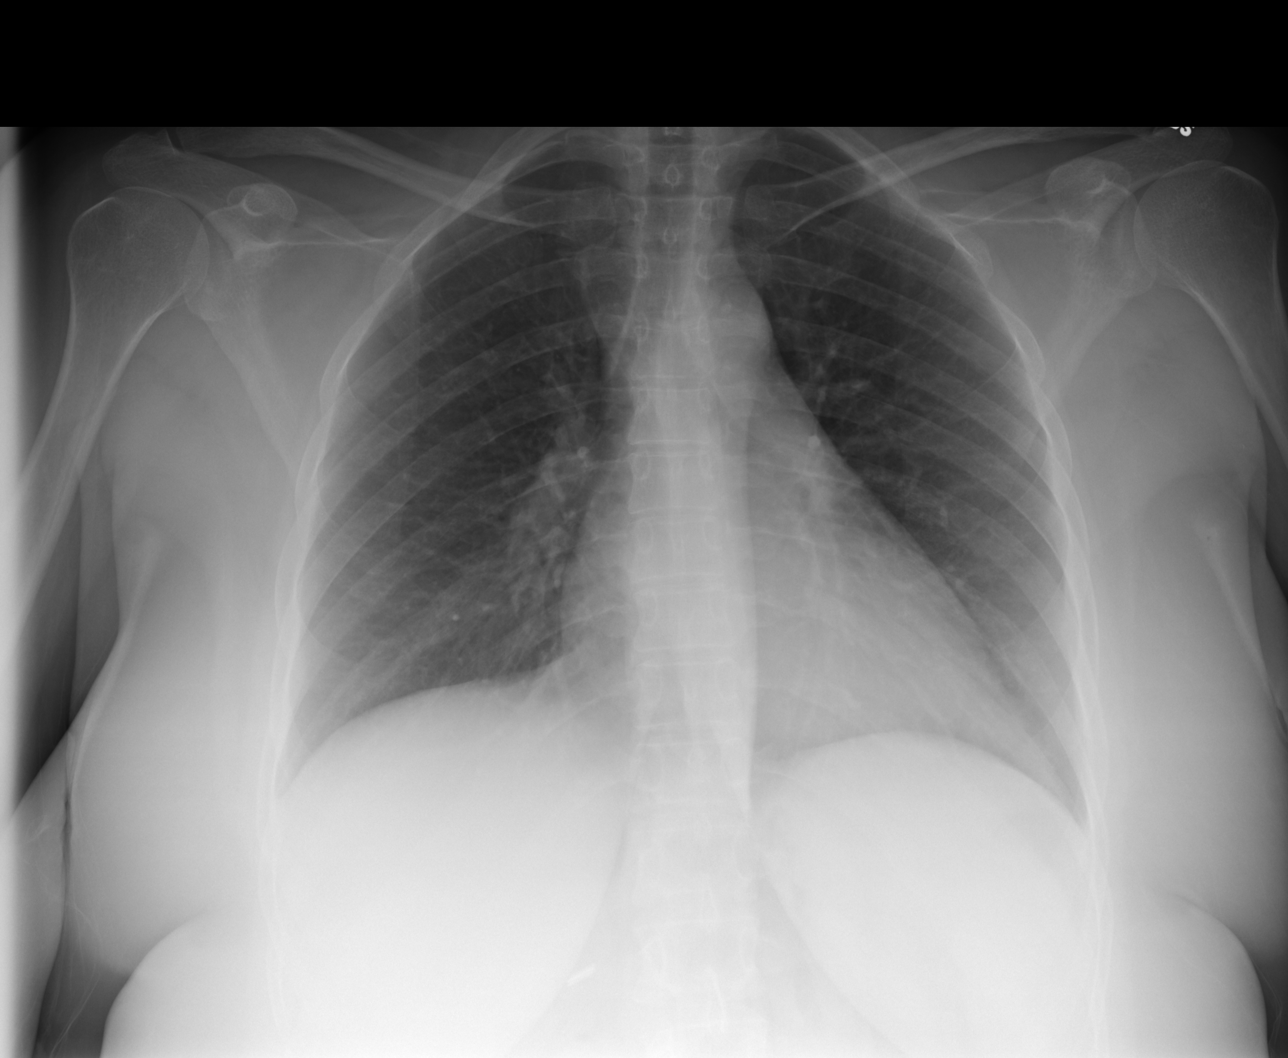
[im 2/4]
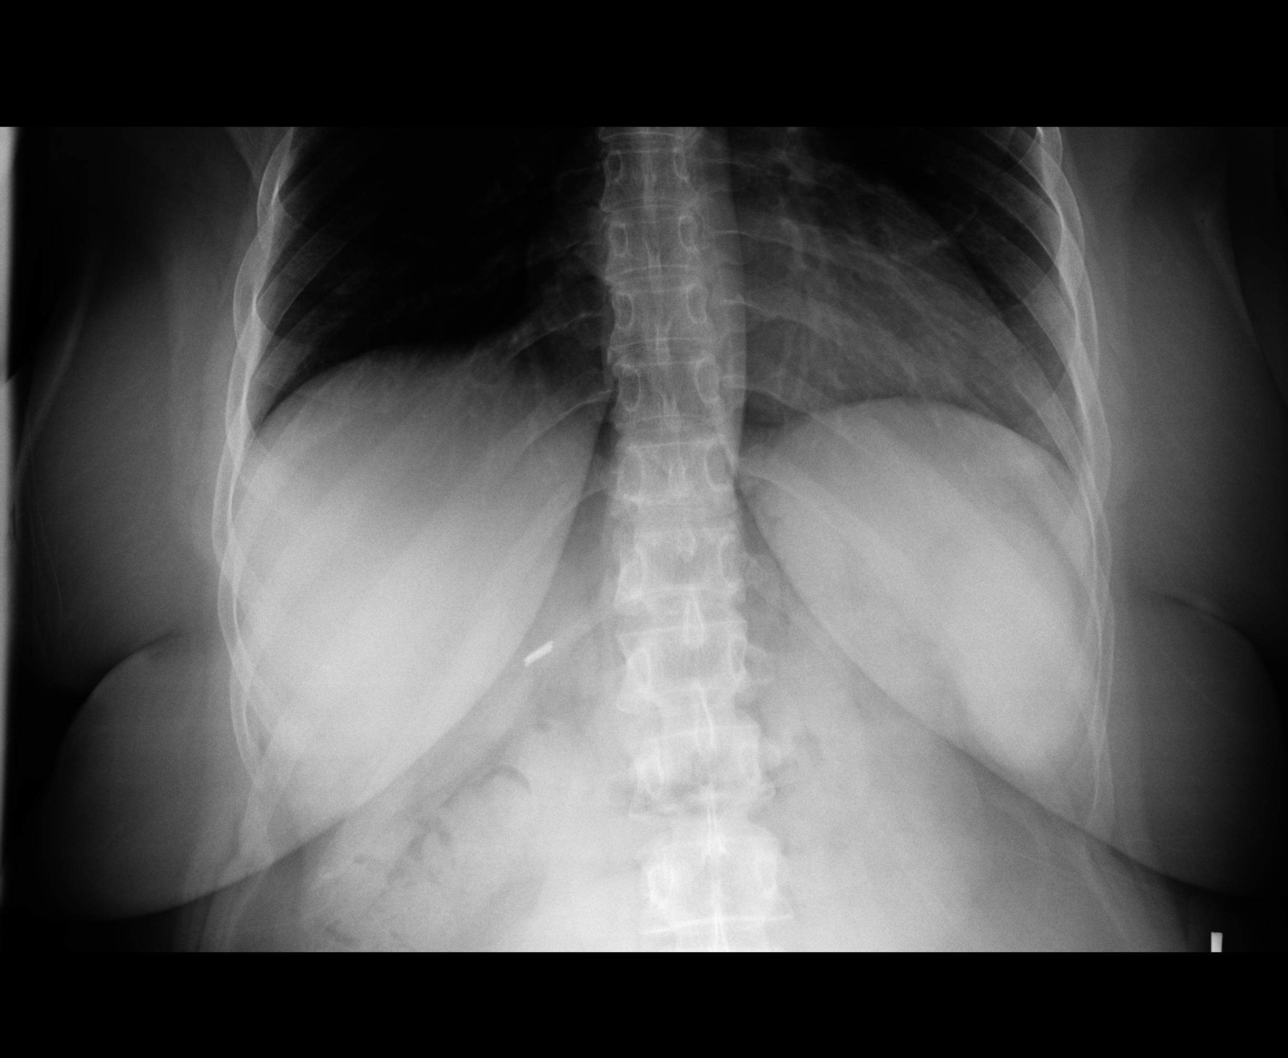
[im 3/4]
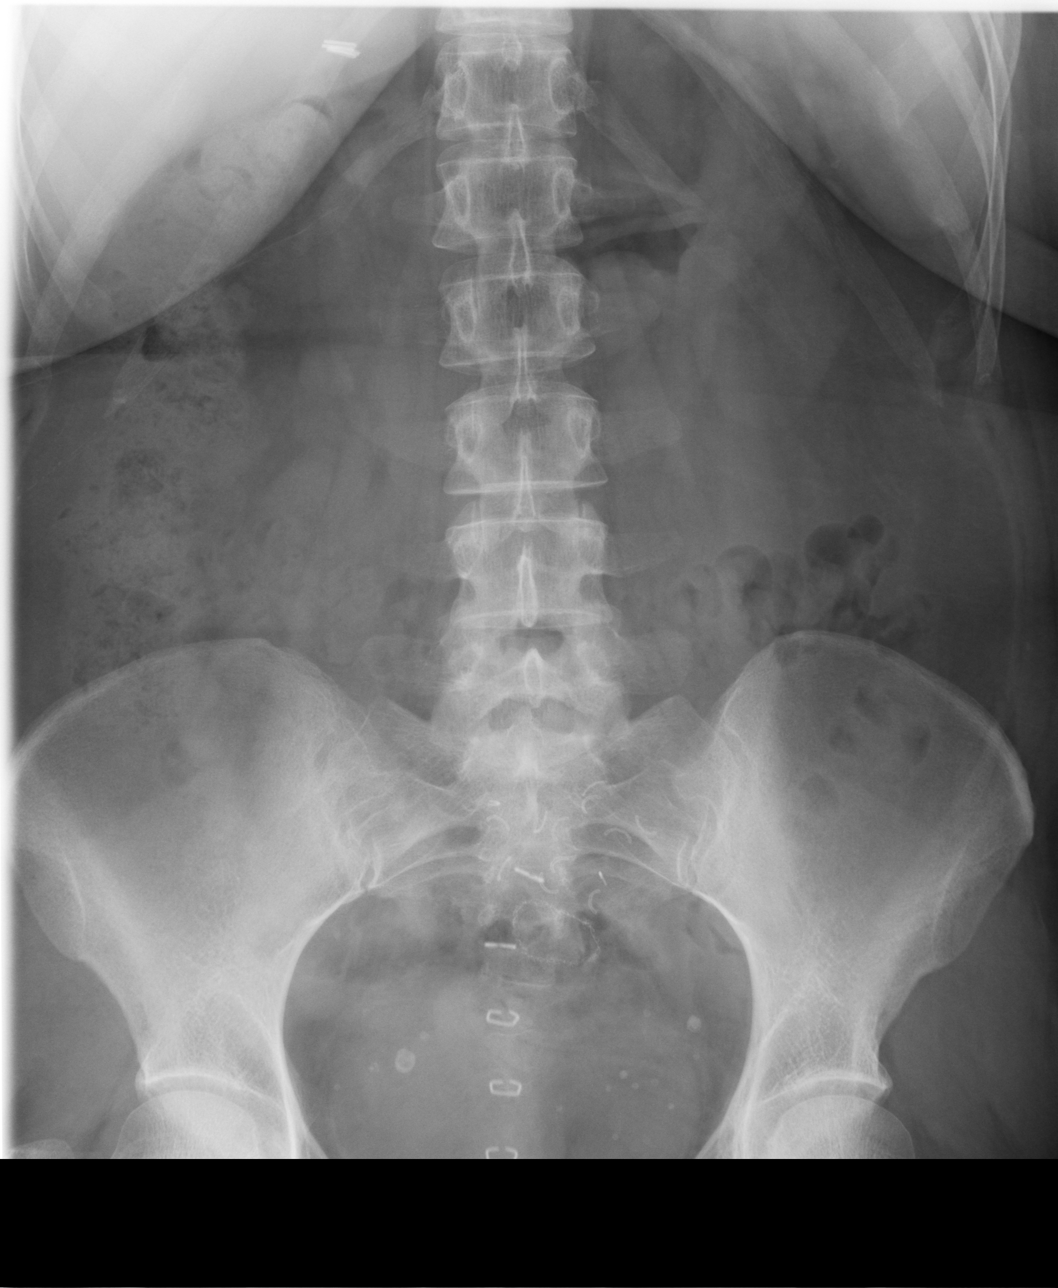
[im 4/4]
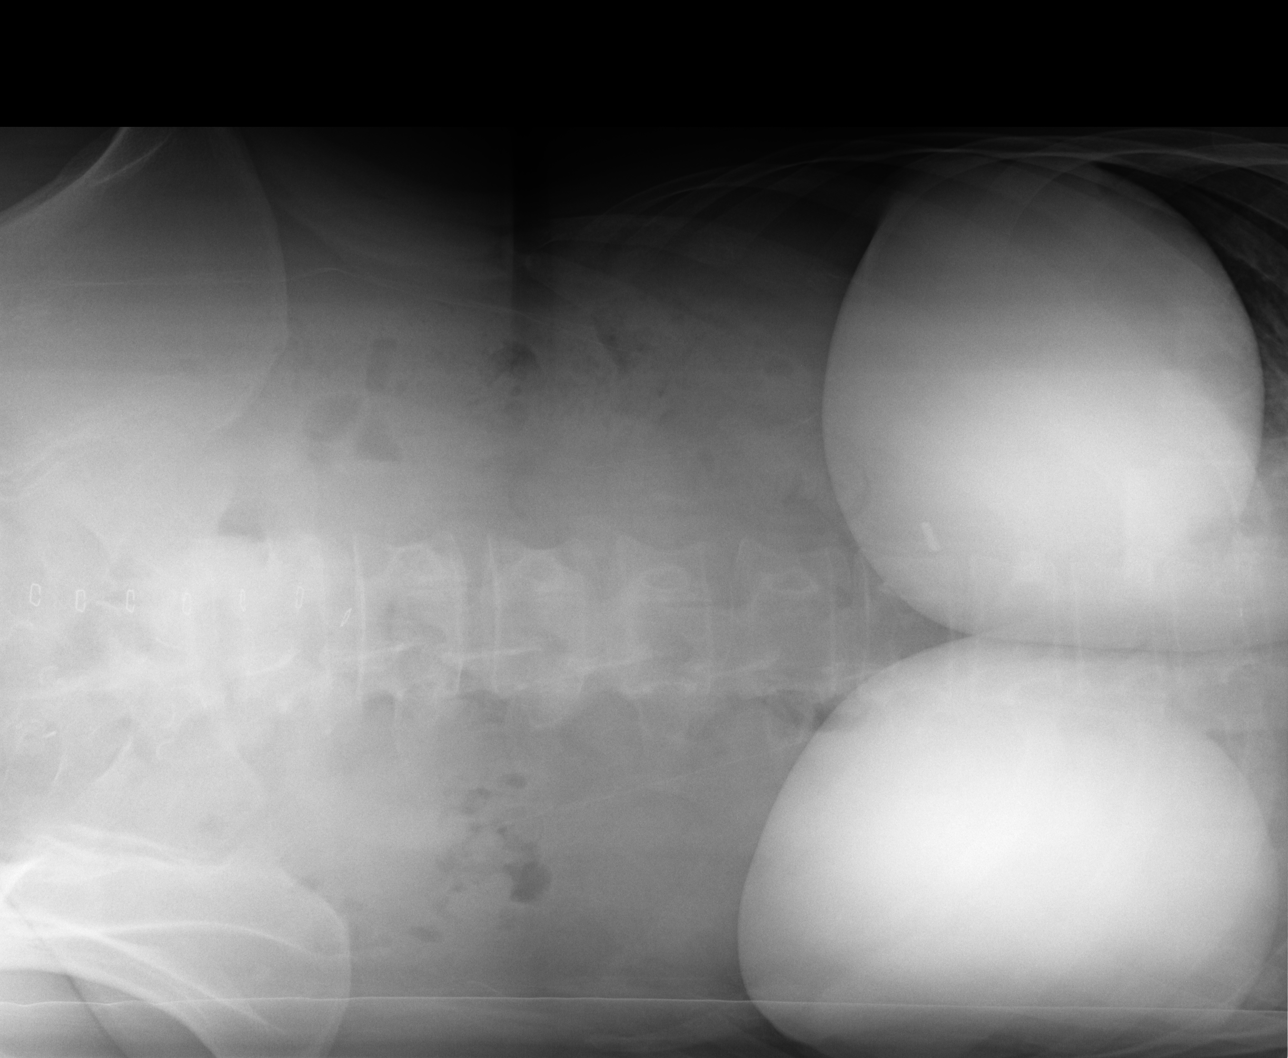

[4 of 4 positions shown; findings below may reference images not displayed]

PROCEDURE:     DXR - DXR ABDOMEN 3-WAY (INCL PA CXR)  - March 08, 2009  [DATE]

RESULT:     Soft tissue structures are unremarkable. Stool is the colon.
Calcified pelvic phleboliths noted. Staples and sutures over the pelvis.
Patient's had a prior cholecystectomy. No acute pulmonary disease. No free
air.
IMPRESSION: Nonspecific exam.

## 2010-06-29 ENCOUNTER — Observation Stay: Payer: Self-pay | Admitting: Internal Medicine

## 2010-09-05 HISTORY — PX: NASAL SINUS SURGERY: SHX719

## 2010-09-26 ENCOUNTER — Encounter: Payer: Self-pay | Admitting: Neurology

## 2010-10-23 IMAGING — CT CT CHEST W/ CM
1 series · 15 of 33 positions shown, 19 images · IV contrast (APPLIED)
Comparison: none

REASON FOR EXAM: chest pain  + d dimer
COMMENTS:

[Series 4: soft tissue · axial · 0.67mm/px · z∈[-286,-79]mm · 15 of 82 slices shown, 19 images]
[im 7/82  mediastinal]
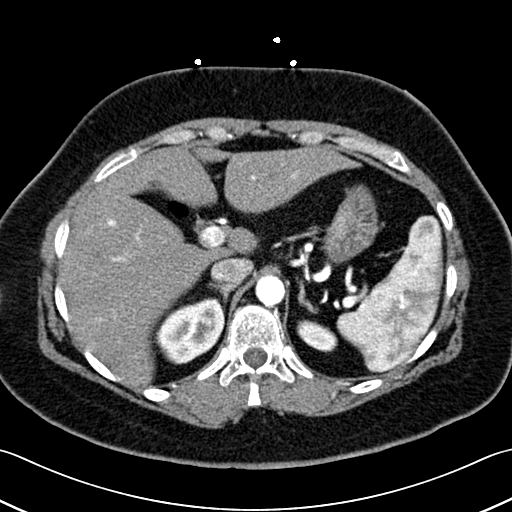
[im 7/82  lung]
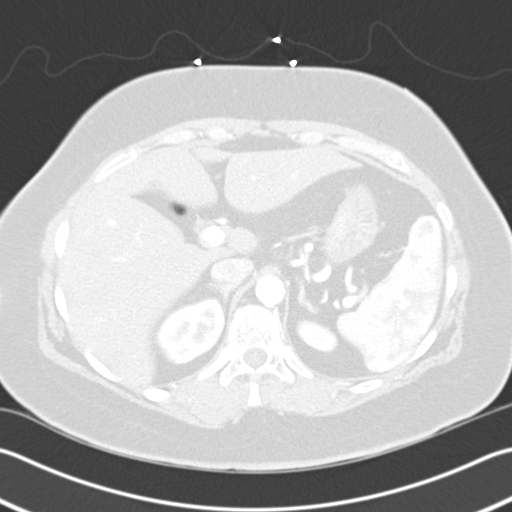
[im 13/82  lung]
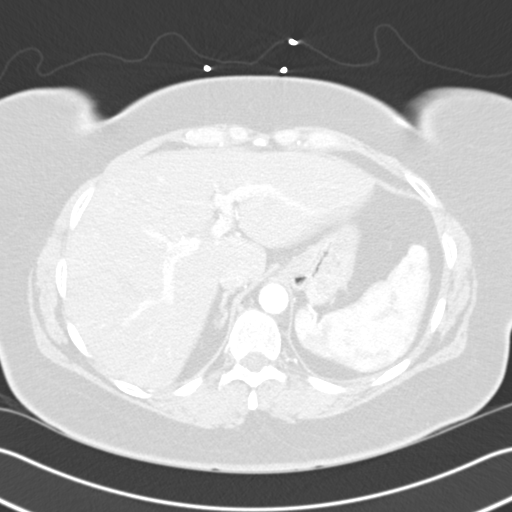
[im 17/82  lung]
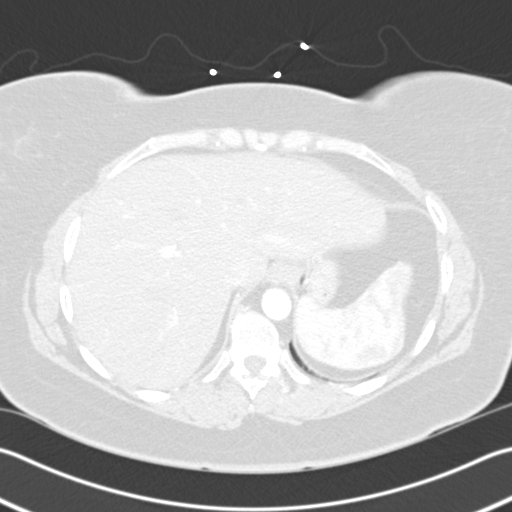
[im 22/82  lung]
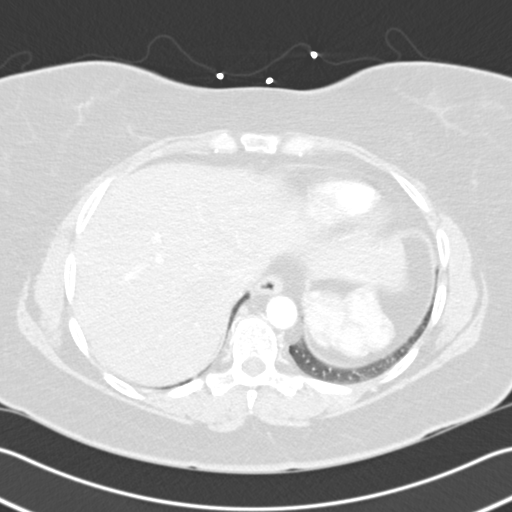
[im 28/82  mediastinal]
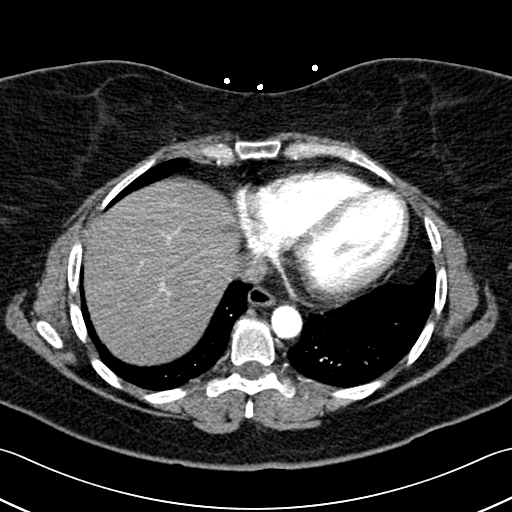
[im 28/82  lung]
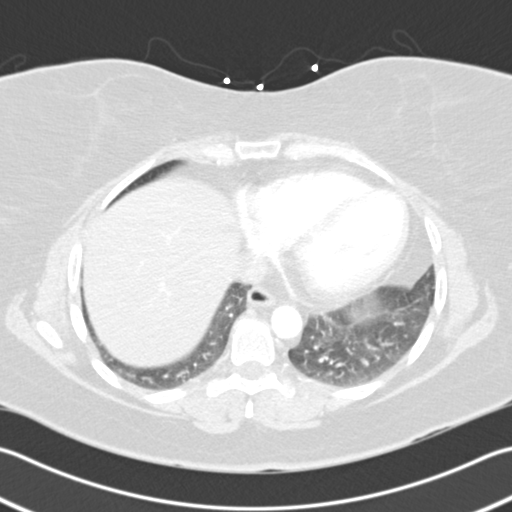
[im 33/82  lung]
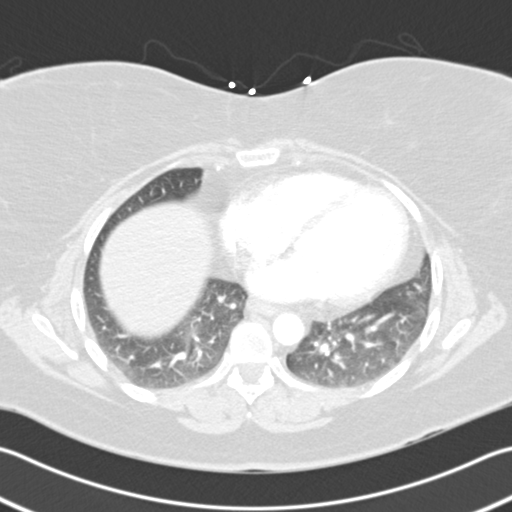
[im 37/82  lung]
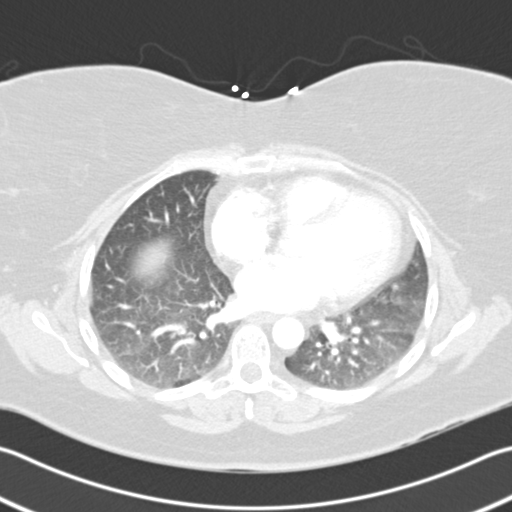
[im 40/82  lung]
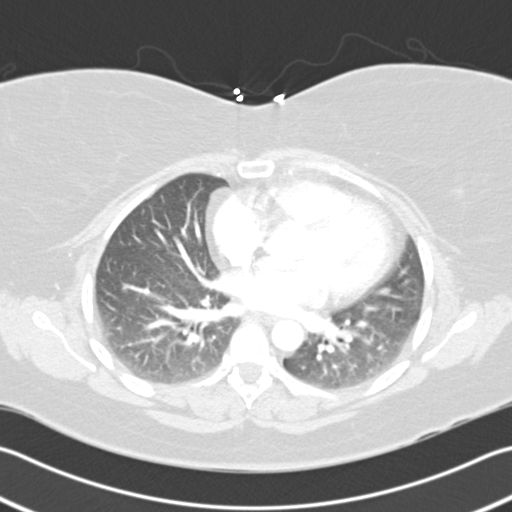
[im 44/82  mediastinal]
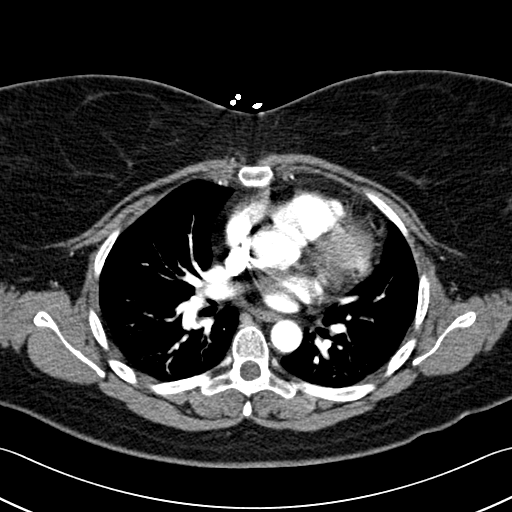
[im 44/82  lung]
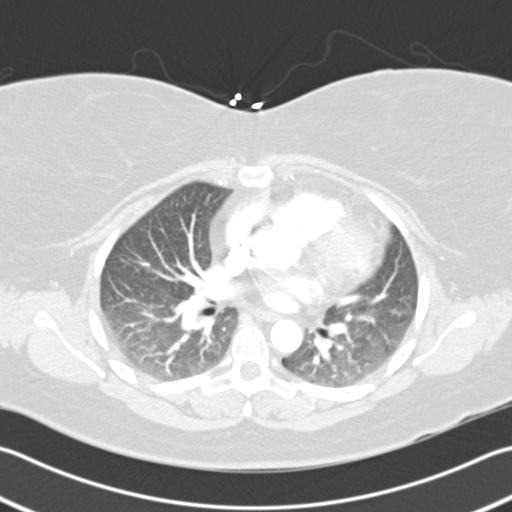
[im 49/82  lung]
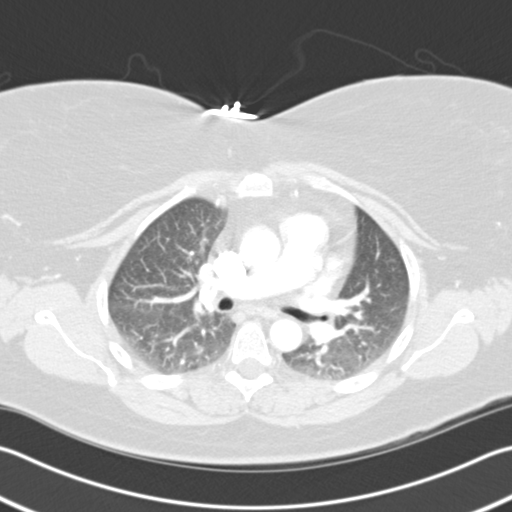
[im 55/82  lung]
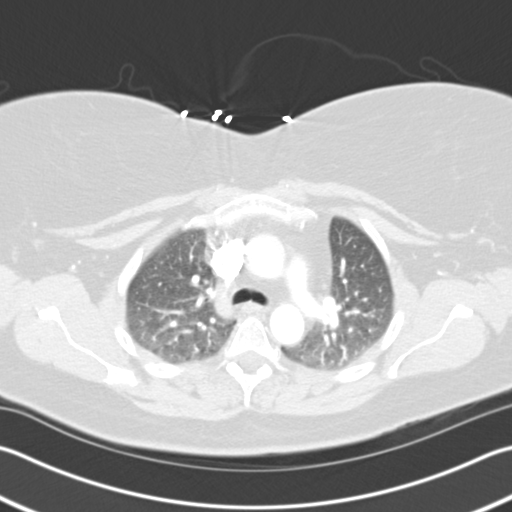
[im 61/82  lung]
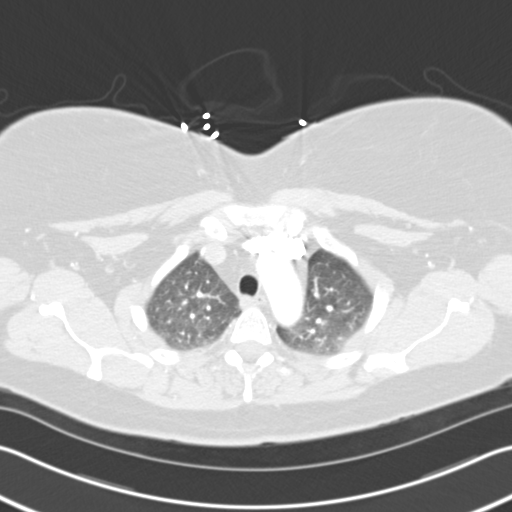
[im 65/82  mediastinal]
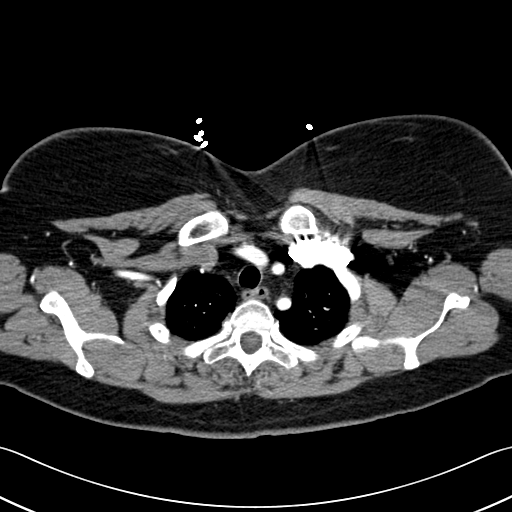
[im 65/82  lung]
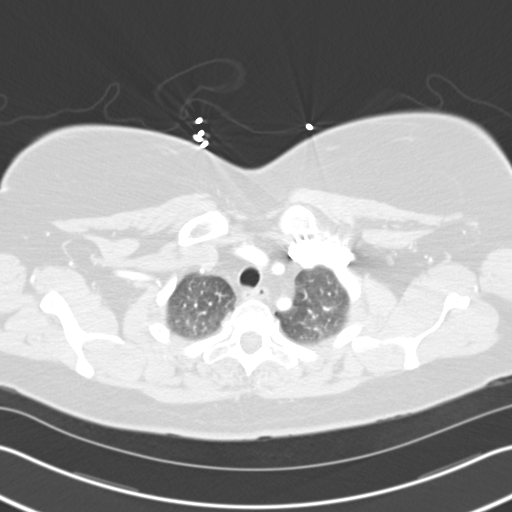
[im 70/82  lung]
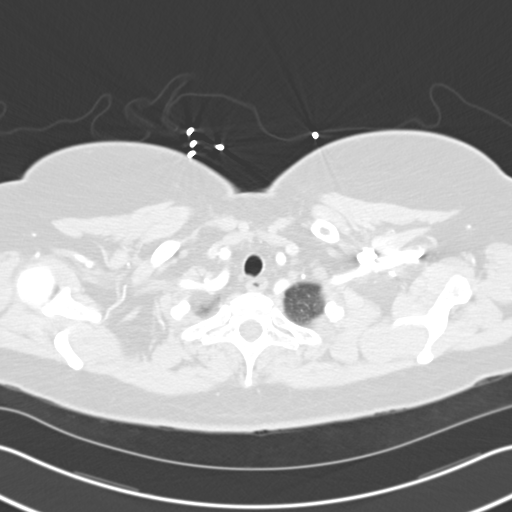
[im 76/82  lung]
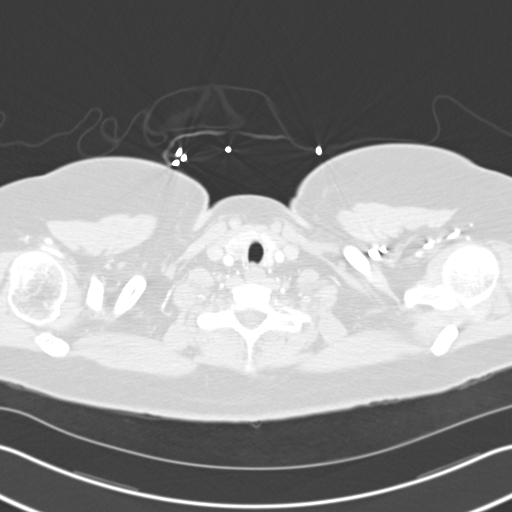

[15 of 33 positions shown; findings below may reference images not displayed]

PROCEDURE:     CT  - CT CHEST (FOR PE) W  - August 07, 2009  [DATE]

RESULT:     Noncontrast emergent CT of the chest is reconstructed at 3 mm
slice thickness utilizing a pulmonary embolism but protocol. The patient
received 100 mL of Wsovue-21V iodinated intravenous contrast. Comparison is
made to a study of 12/26/2008.

There is dependent atelectasis in both lower lobes and posterior lung zones.
There is no focal consolidation. The aorta opacifies normally with no
dissection or aneurysm. The pulmonary arteries show no filling defect. There
is fatty infiltration of the liver. The upper abdominal structures otherwise
unremarkable. There is a low-attenuation area measuring 11.8 mm in the lower
pole of the right lobe of the thyroid. This is unchanged. There is no
adenopathy. There is no pleural or pericardial effusion.
IMPRESSION: 1. No pulmonary embolism. Number low-attenuation area in the lower pole of
the right lobe of the thyroid.
3. Status post cholecystectomy. There is dependent atelectasis noted as
described above.

## 2010-12-13 ENCOUNTER — Emergency Department: Payer: Self-pay | Admitting: Emergency Medicine

## 2011-02-25 ENCOUNTER — Emergency Department: Payer: Self-pay | Admitting: Emergency Medicine

## 2011-07-10 ENCOUNTER — Emergency Department: Payer: Self-pay | Admitting: *Deleted

## 2011-07-21 ENCOUNTER — Ambulatory Visit: Payer: Self-pay | Admitting: Otolaryngology

## 2011-09-06 DIAGNOSIS — J45909 Unspecified asthma, uncomplicated: Secondary | ICD-10-CM

## 2011-09-06 DIAGNOSIS — I519 Heart disease, unspecified: Secondary | ICD-10-CM

## 2011-09-06 HISTORY — DX: Heart disease, unspecified: I51.9

## 2011-09-06 HISTORY — DX: Unspecified asthma, uncomplicated: J45.909

## 2011-09-14 IMAGING — CR DG CHEST 2V
1 series · 2 of 2 positions shown · non-contrast
Comparison: none

REASON FOR EXAM: chest pain
COMMENTS:

PROCEDURE:     DXR - DXR CHEST PA (OR AP) AND LATERAL  - June 29, 2010  [DATE]
RESULT:     Comparison is made to the prior exam of 08/07/2009. The lung
fields are clear. The heart, mediastinal and osseous structures are normal
in appearance.

[Series 1: view not recorded · 0.17mm/px · 2 of 2 slices shown]
[im 1/2]
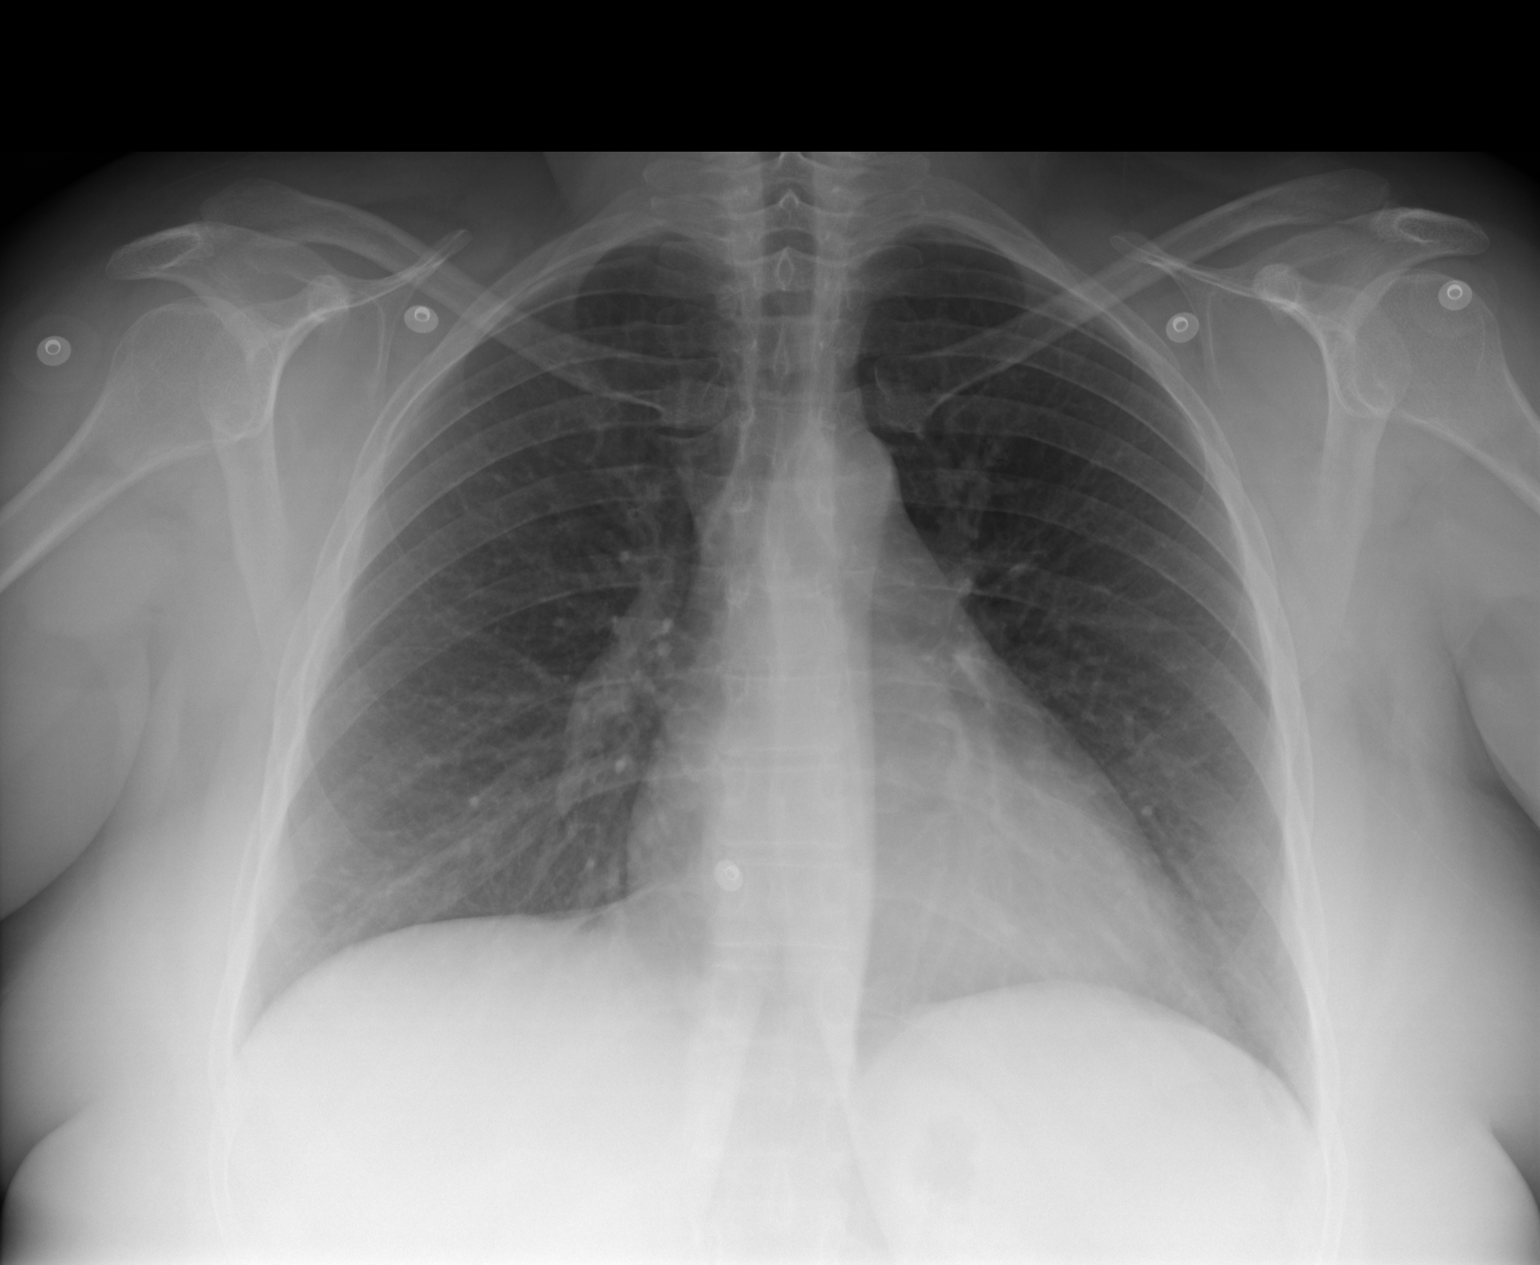
[im 2/2]
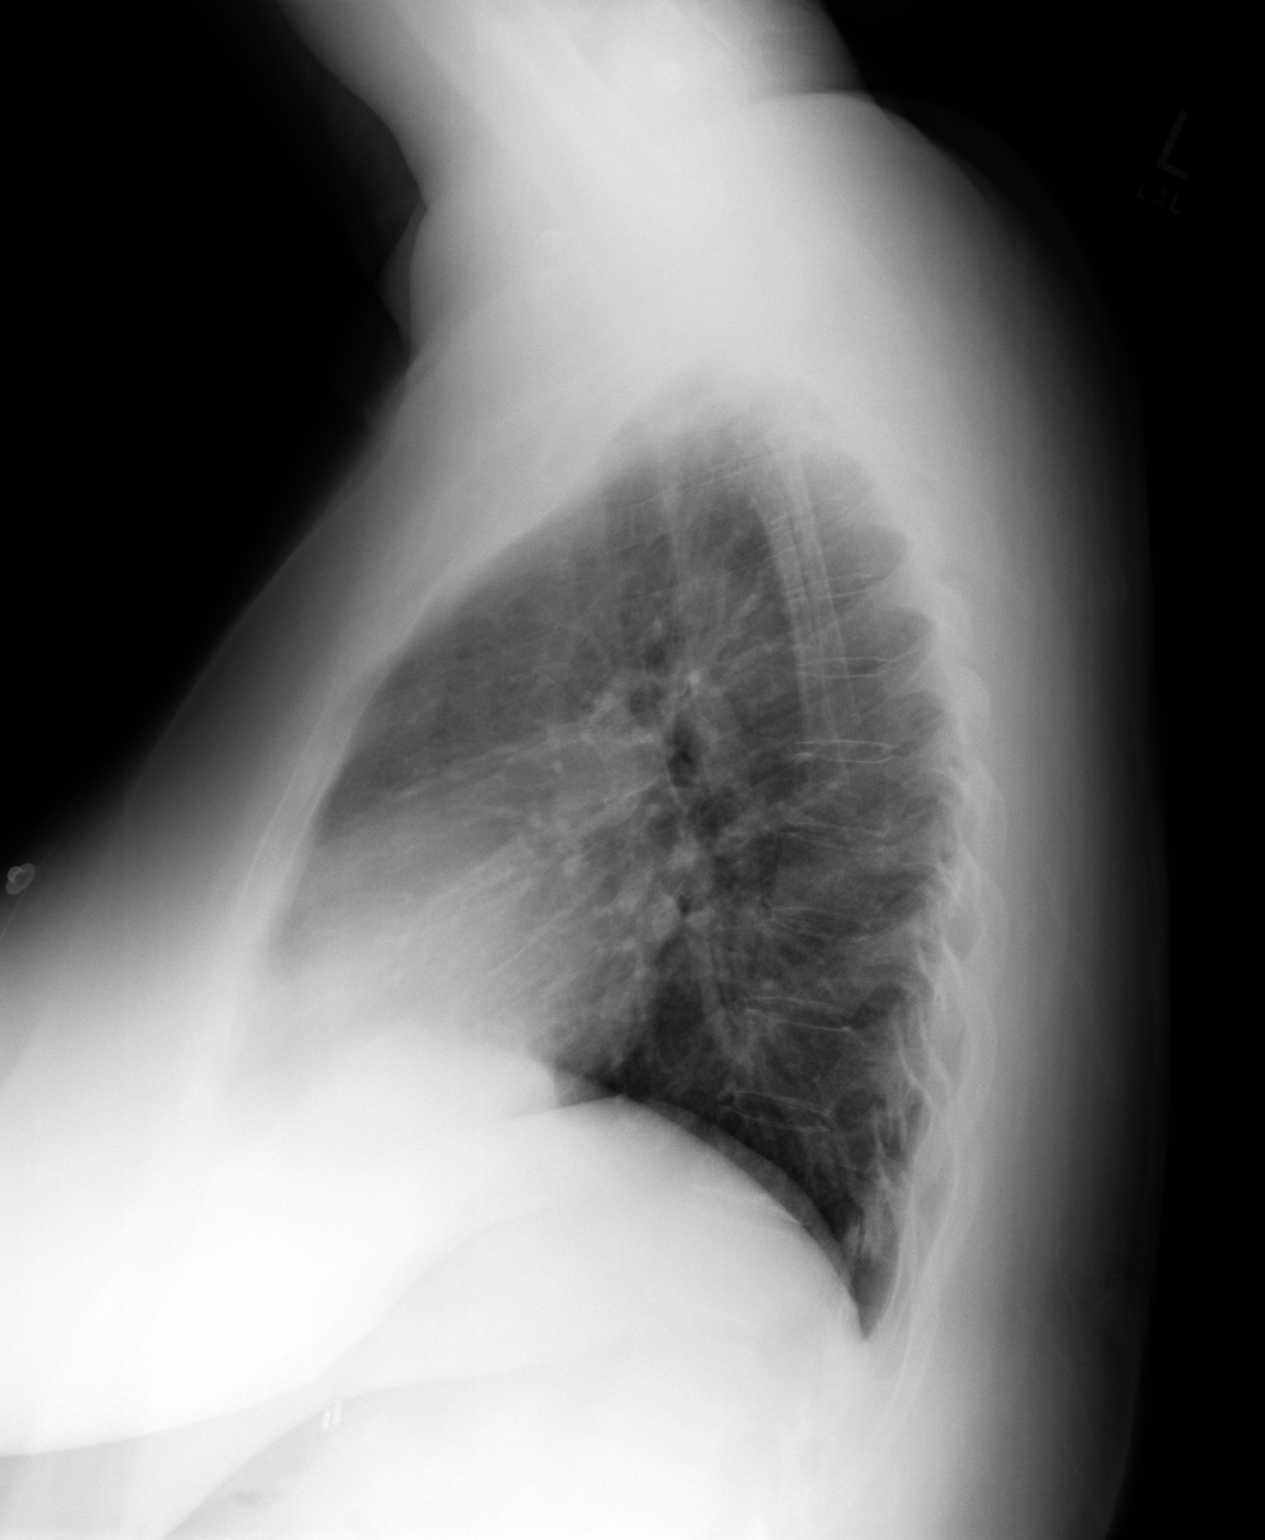

[2 of 2 positions shown; findings below may reference images not displayed]

IMPRESSION: 1.     No significant abnormalities are noted.

## 2011-09-14 IMAGING — CT CT CHEST W/ CM
2 series · 15 of 32 positions shown, 19 images · IV contrast (APPLIED)
Comparison: none

REASON FOR EXAM: chest pain, shortness of breath, tachycardia, hx of DVT
in past.
COMMENTS:

PROCEDURE:     CT  - CT CHEST (FOR PE) W  - June 29, 2010  [DATE]
RESULT:     Chest CT dated 06/29/2010.
TECHNIQUE: Helical 3 mm sections were obtained from the thoracic inlet
through the lung bases status post intravenous administration of 100 mL of
Zsovue-QQ1.

[Series 4: soft tissue · axial · 0.77mm/px · 1 of 93 slices shown]
[im 8/93  mediastinal]
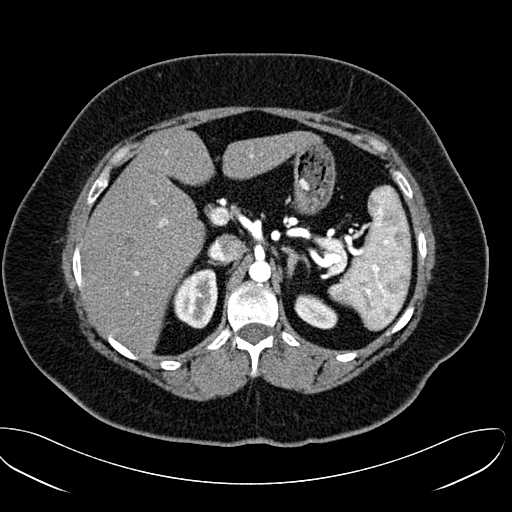

[Series 5: lung windows · axial · 0.77mm/px · z∈[+315,+543]mm · 14 of 90 slices shown, 18 images]
[im 7/90  mediastinal]
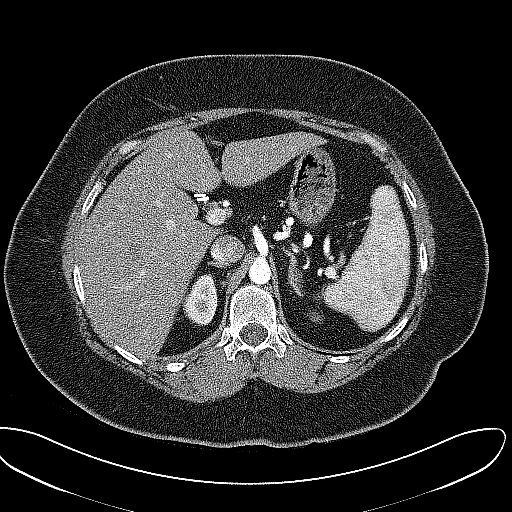
[im 7/90  lung]
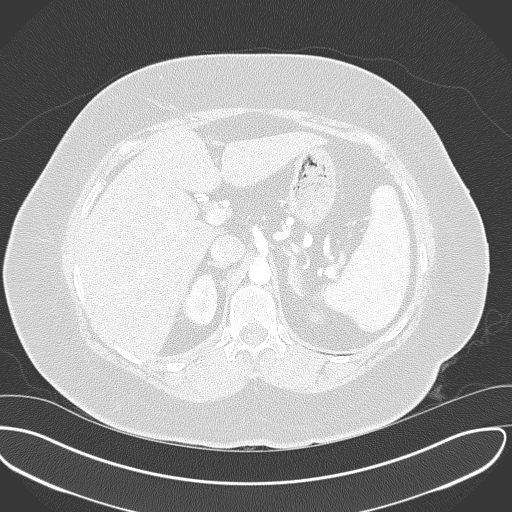
[im 14/90  lung]
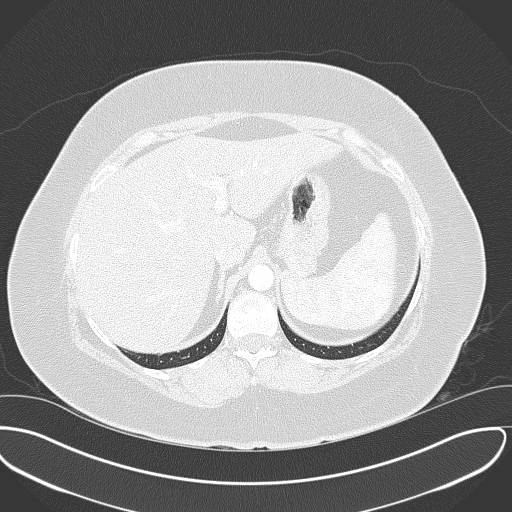
[im 21/90  lung]
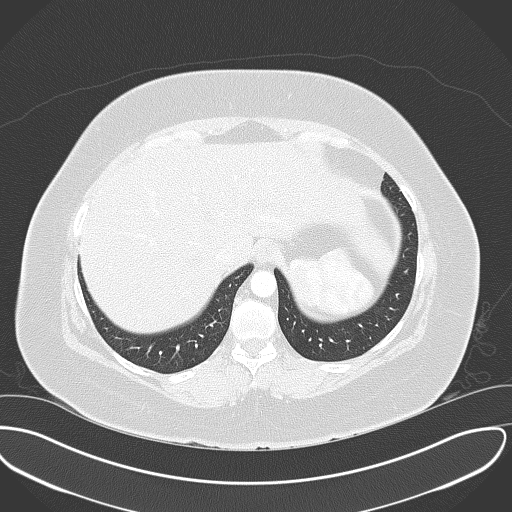
[im 28/90  lung]
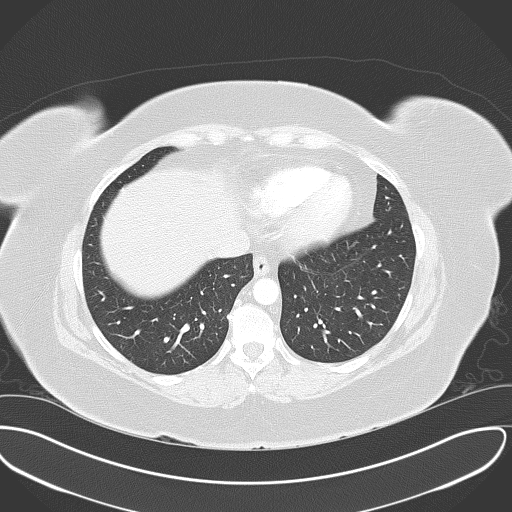
[im 35/90  mediastinal]
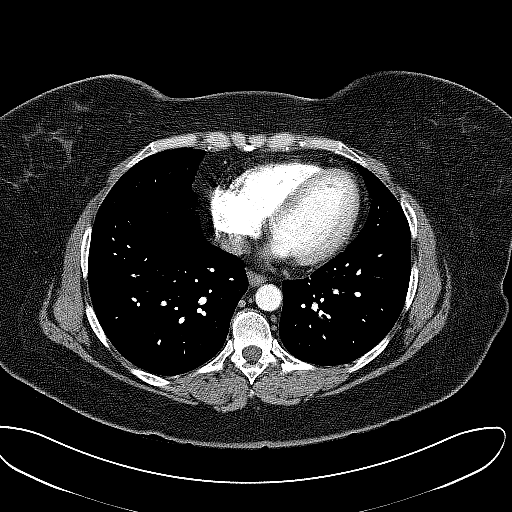
[im 35/90  lung]
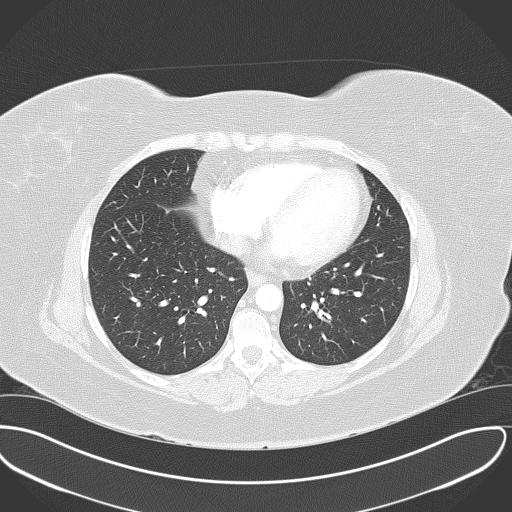
[im 41/90  lung]
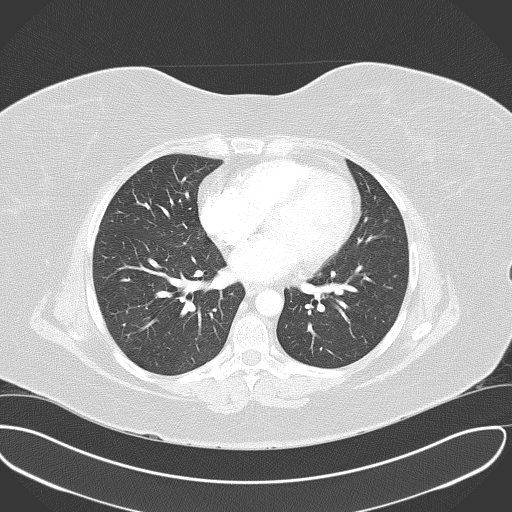
[im 42/90  lung]
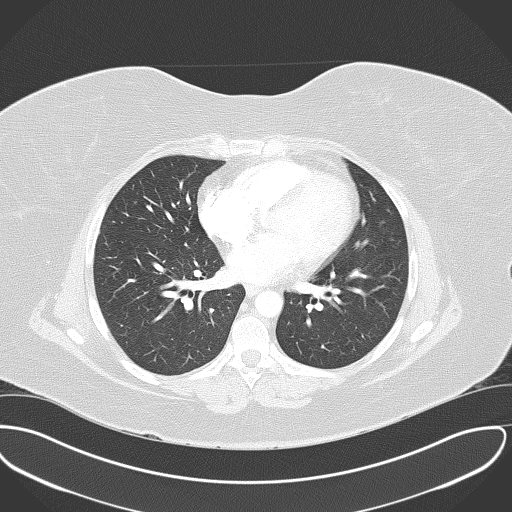
[im 45/90  lung]
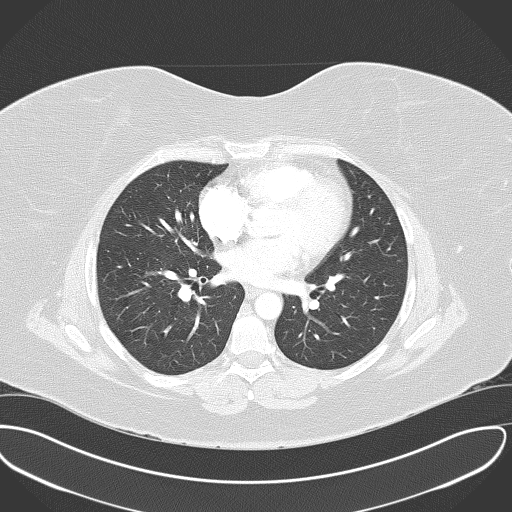
[im 48/90  mediastinal]
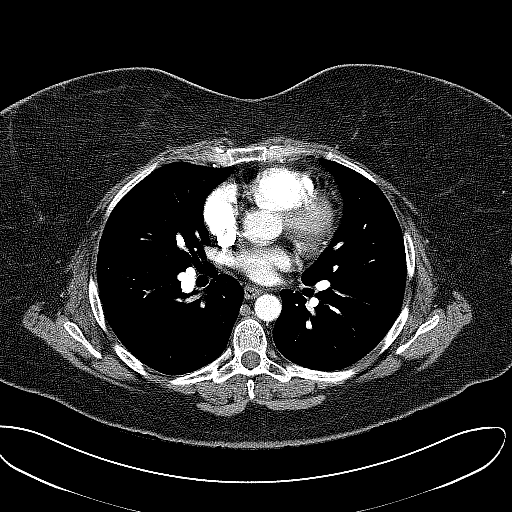
[im 48/90  lung]
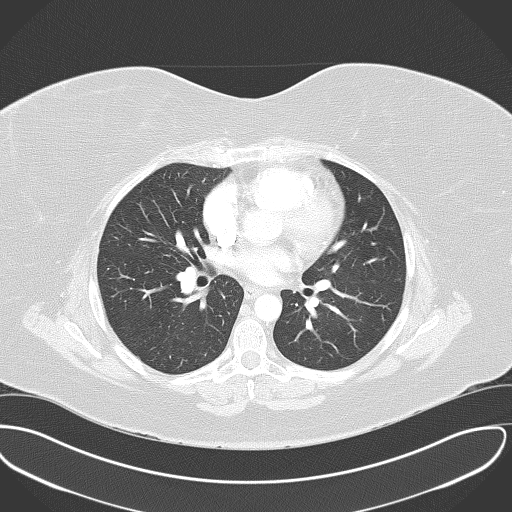
[im 55/90  lung]
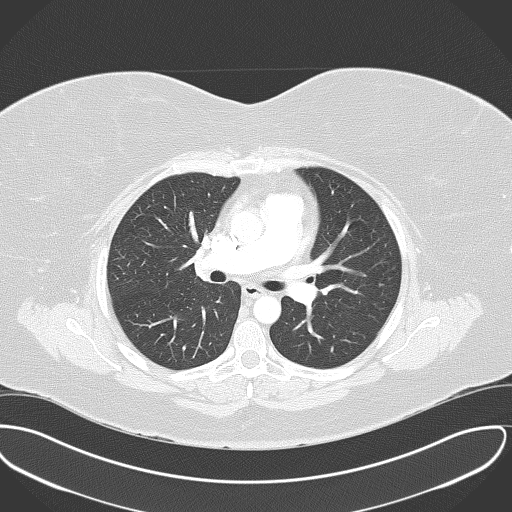
[im 62/90  lung]
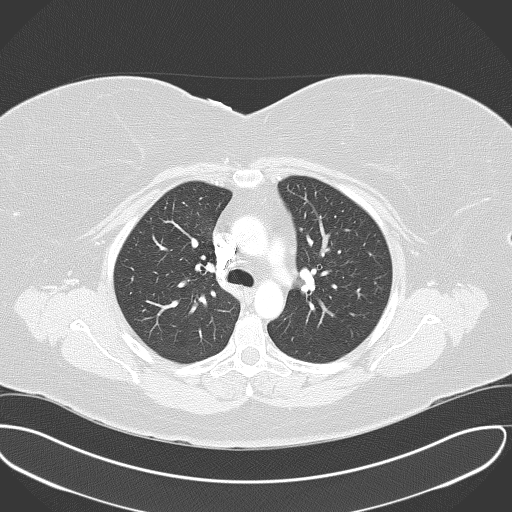
[im 69/90  lung]
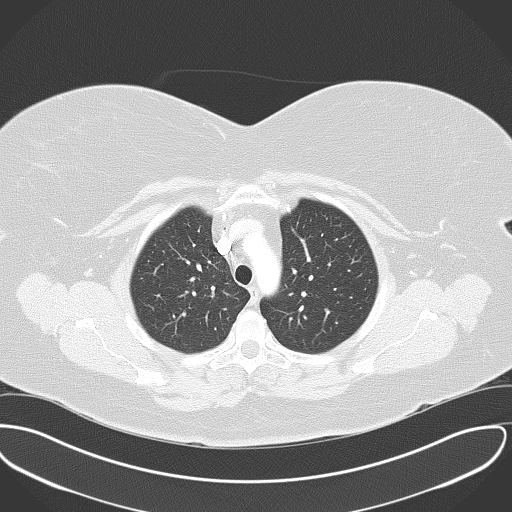
[im 76/90  mediastinal]
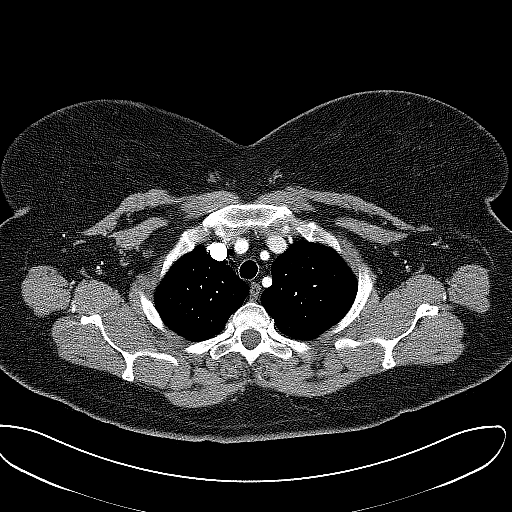
[im 76/90  lung]
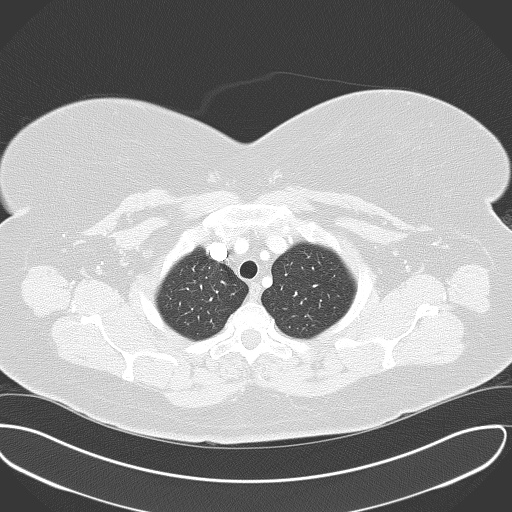
[im 83/90  lung]
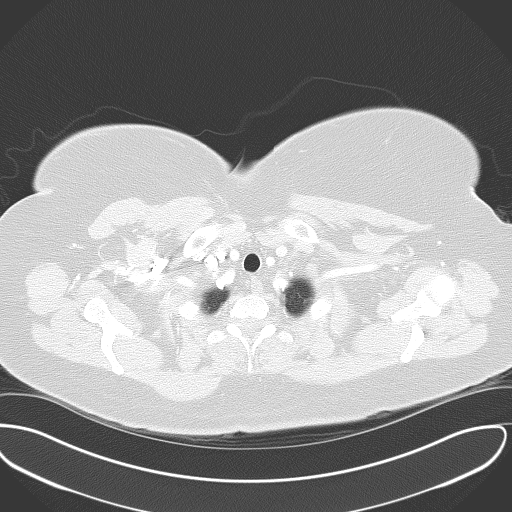

[15 of 32 positions shown; findings below may reference images not displayed]

FINDINGS: Evaluation of mediastinum hilar regions and structures the
demonstrate no evidence of mediastinal nor hilar adenopathy nor masses.
There is no evidence of filling defects within the main, lobar, or segmental
pulmonary arteries. There is no evidence of a thoracic aortic aneurysm nor
dissection. There is no evidence of focal infiltrates, effusions, edema,
masses, or nodules. The visualized upper abdominal viscera demonstrate no
gross abnormalities.
IMPRESSION: 1. No CT evidence of pulmonary arterial embolic disease.
2. No focal acute intrathoracic abnormalities.

## 2011-10-13 ENCOUNTER — Ambulatory Visit: Payer: Self-pay | Admitting: Family Medicine

## 2011-11-17 LAB — COMPREHENSIVE METABOLIC PANEL
Albumin: 3.6 g/dL (ref 3.4–5.0)
Anion Gap: 11 (ref 7–16)
BUN: 13 mg/dL (ref 7–18)
Chloride: 105 mmol/L (ref 98–107)
Co2: 25 mmol/L (ref 21–32)
EGFR (African American): >60
Osmolality: 282 (ref 275–301)
Potassium: 3.9 mmol/L (ref 3.5–5.1)
SGOT(AST): 23 U/L (ref 15–37)
SGPT (ALT): 22 U/L
Sodium: 141 mmol/L (ref 136–145)
Total Protein: 8.3 g/dL — ABNORMAL HIGH (ref 6.4–8.2)

## 2011-11-17 LAB — PROTIME-INR
INR: 0.9
Prothrombin Time: 13 secs (ref 11.5–14.7)

## 2011-11-17 LAB — APTT: Activated PTT: 27.6 secs (ref 23.6–35.9)

## 2011-11-17 LAB — CBC
MCH: 24.3 pg — ABNORMAL LOW (ref 26.0–34.0)
MCV: 75 fL — ABNORMAL LOW (ref 80–100)
Platelet: 283 10*3/uL (ref 150–440)
RBC: 4.24 10*6/uL (ref 3.80–5.20)
RDW: 16.9 % — ABNORMAL HIGH (ref 11.5–14.5)

## 2011-11-17 LAB — CK TOTAL AND CKMB (NOT AT ARMC): CK-MB: 1 ng/mL (ref 0.5–3.6)

## 2011-11-17 LAB — TROPONIN I: Troponin-I: <0.02 ng/mL

## 2011-11-18 ENCOUNTER — Observation Stay: Payer: Self-pay | Admitting: Internal Medicine

## 2011-11-18 LAB — TROPONIN I: Troponin-I: <0.02 ng/mL

## 2011-11-18 LAB — CK TOTAL AND CKMB (NOT AT ARMC): CK, Total: 85 U/L (ref 21–215)

## 2012-02-11 ENCOUNTER — Emergency Department: Payer: Self-pay | Admitting: *Deleted

## 2012-02-11 LAB — COMPREHENSIVE METABOLIC PANEL
Alkaline Phosphatase: 132 U/L (ref 50–136)
Anion Gap: 8 (ref 7–16)
BUN: 17 mg/dL (ref 7–18)
Bilirubin,Total: 0.3 mg/dL (ref 0.2–1.0)
Calcium, Total: 8.8 mg/dL (ref 8.5–10.1)
Chloride: 105 mmol/L (ref 98–107)
Co2: 28 mmol/L (ref 21–32)
EGFR (Non-African Amer.): >60
Osmolality: 283 (ref 275–301)
Potassium: 3.4 mmol/L — ABNORMAL LOW (ref 3.5–5.1)
SGOT(AST): 20 U/L (ref 15–37)
SGPT (ALT): 26 U/L
Sodium: 141 mmol/L (ref 136–145)

## 2012-02-11 LAB — URINALYSIS, COMPLETE
Glucose,UR: NEGATIVE mg/dL (ref 0–75)
Nitrite: NEGATIVE
Protein: NEGATIVE
Specific Gravity: 1.012 (ref 1.003–1.030)
Squamous Epithelial: 2
WBC UR: 8 /HPF (ref 0–5)

## 2012-02-11 LAB — CBC WITH DIFFERENTIAL/PLATELET
Basophil %: 0.4 %
Eosinophil #: 0.1 10*3/uL (ref 0.0–0.7)
HCT: 33 % — ABNORMAL LOW (ref 35.0–47.0)
HGB: 11 g/dL — ABNORMAL LOW (ref 12.0–16.0)
Lymphocyte #: 1.5 10*3/uL (ref 1.0–3.6)
MCH: 24.8 pg — ABNORMAL LOW (ref 26.0–34.0)
MCV: 74 fL — ABNORMAL LOW (ref 80–100)
Neutrophil #: 5.6 10*3/uL (ref 1.4–6.5)
Neutrophil %: 71.2 %
Platelet: 267 10*3/uL (ref 150–440)
RBC: 4.45 10*6/uL (ref 3.80–5.20)

## 2012-02-11 LAB — LIPASE, BLOOD: Lipase: 145 U/L (ref 73–393)

## 2012-02-28 IMAGING — CR DG CHEST 1V PORT
1 series · 1 of 1 positions shown · non-contrast
Comparison: none

REASON FOR EXAM: cough/ fever
COMMENTS:

[view not recorded]
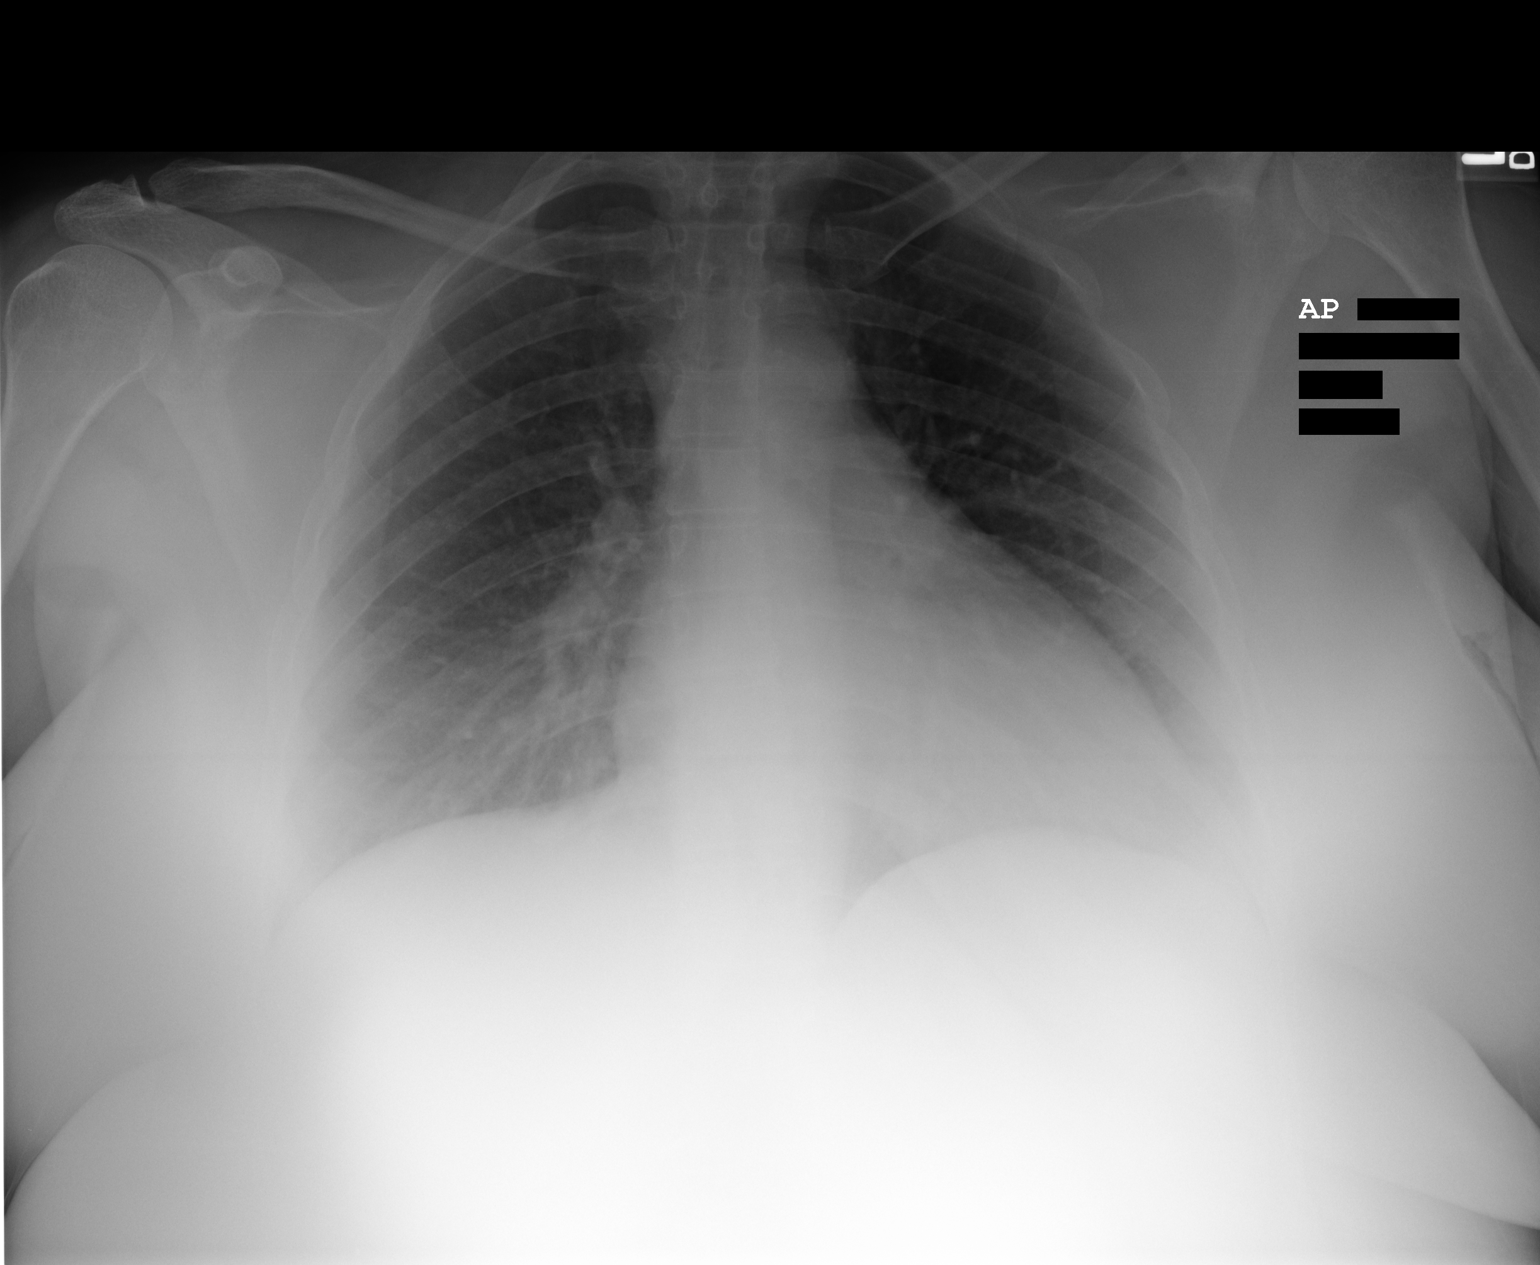

[1 of 1 positions shown; findings below may reference images not displayed]

PROCEDURE:     DXR - DXR PORTABLE CHEST SINGLE VIEW  - December 13, 2010  [DATE]

RESULT:     Comparison is made to the study of 06/29/2010.

The lungs are clear. The heart and pulmonary vessels are normal. The bony
and mediastinal structures are unremarkable. There is no effusion. There is
no pneumothorax or evidence of congestive failure.
IMPRESSION: No acute cardiopulmonary disease.

## 2012-03-21 ENCOUNTER — Emergency Department: Payer: Self-pay | Admitting: Internal Medicine

## 2012-04-24 ENCOUNTER — Emergency Department: Payer: Self-pay | Admitting: Emergency Medicine

## 2012-05-12 IMAGING — CR CERVICAL SPINE - COMPLETE 4+ VIEW
1 series · 5 of 5 positions shown · non-contrast
Comparison: none

REASON FOR EXAM: neck pain, paraesthesias
COMMENTS:

PROCEDURE:     DXR - DXR CERVICAL SPINE COMPLETE  - February 25, 2011 [DATE]
RESULT:     Diffuse degenerative changes are noted of the cervical spine.
Neural foramina are patent. There is no evidence of fracture.

[Series 1: view not recorded · 0.17mm/px · 5 of 5 slices shown]
[im 1/5]
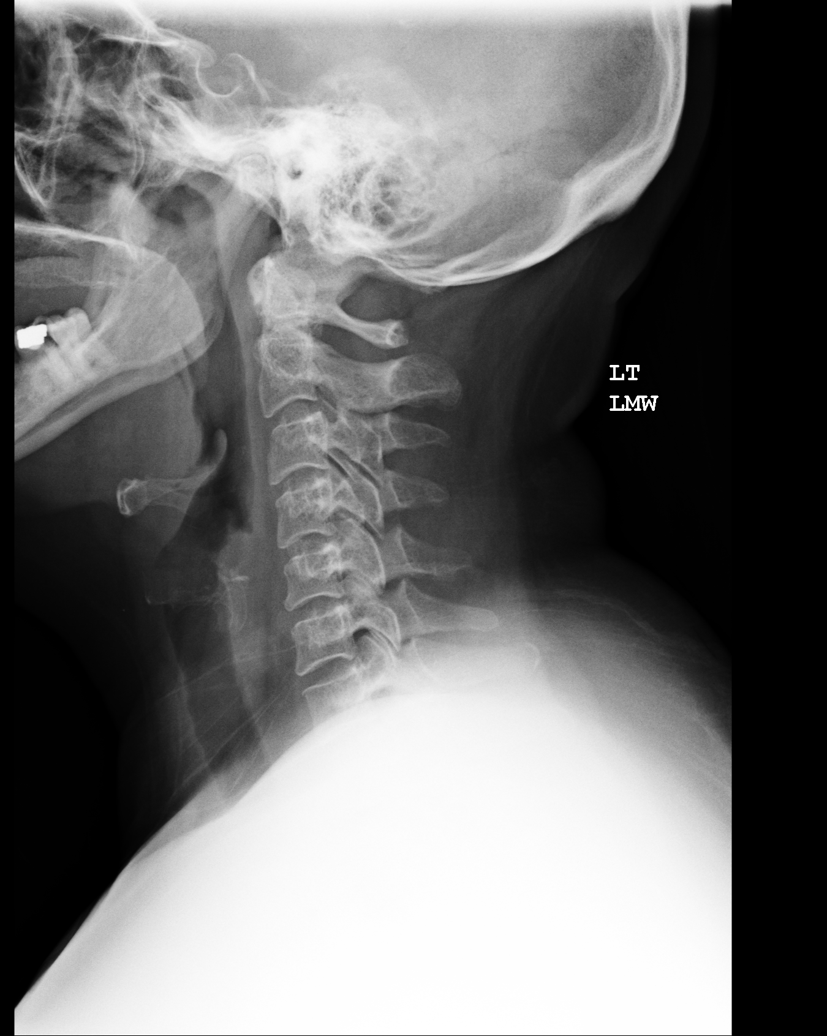
[im 2/5]
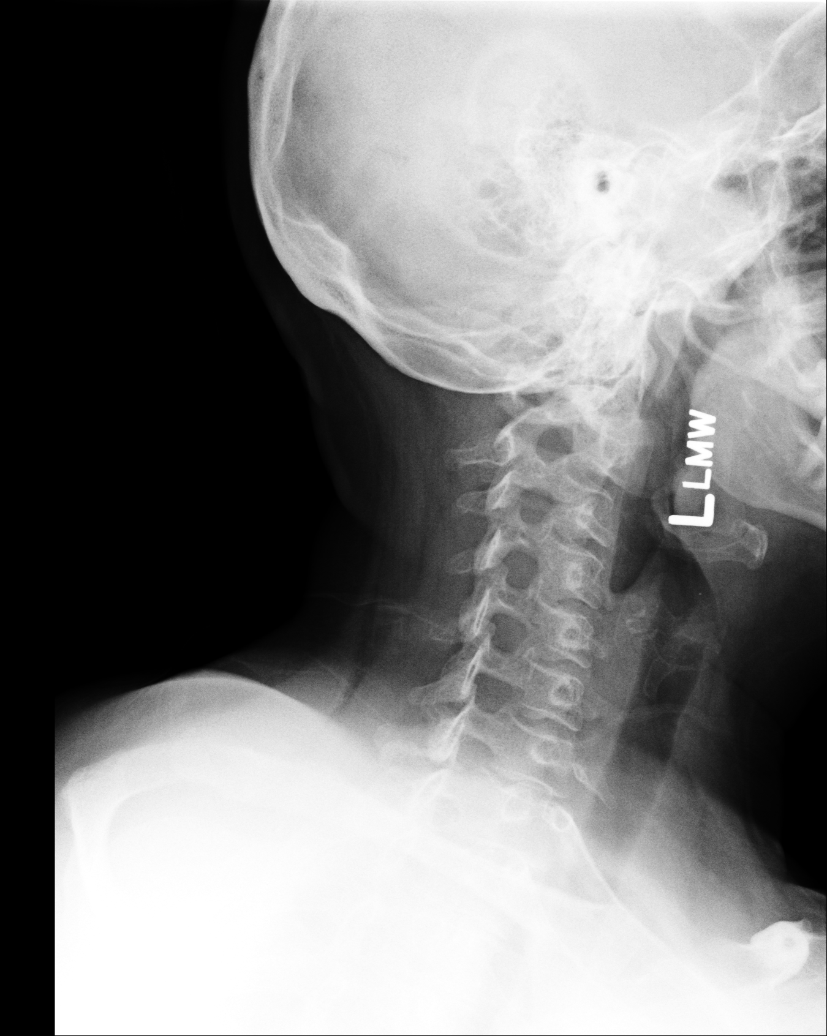
[im 3/5]
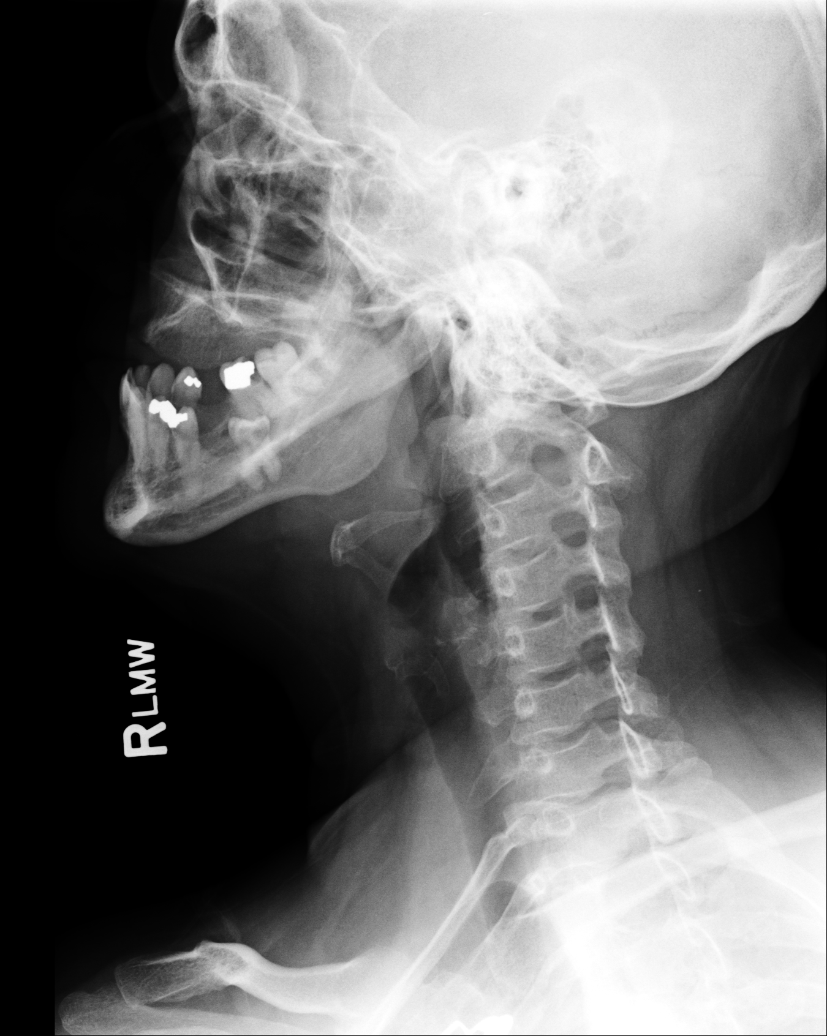
[im 4/5]
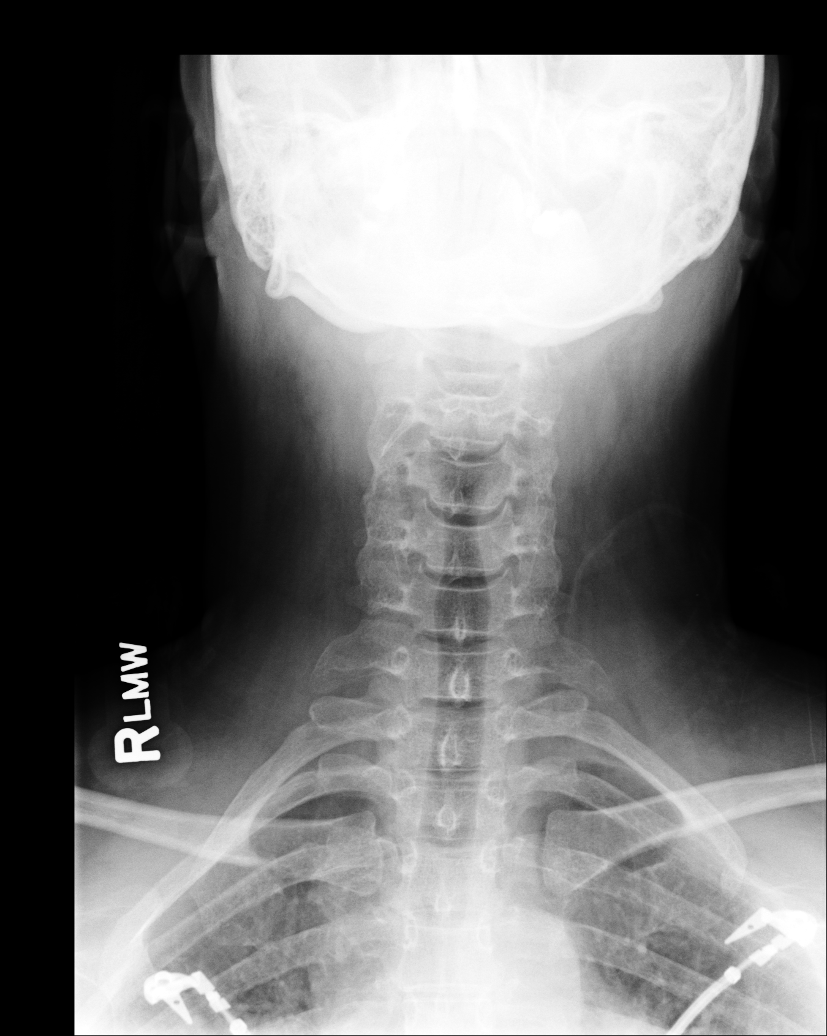
[im 5/5]
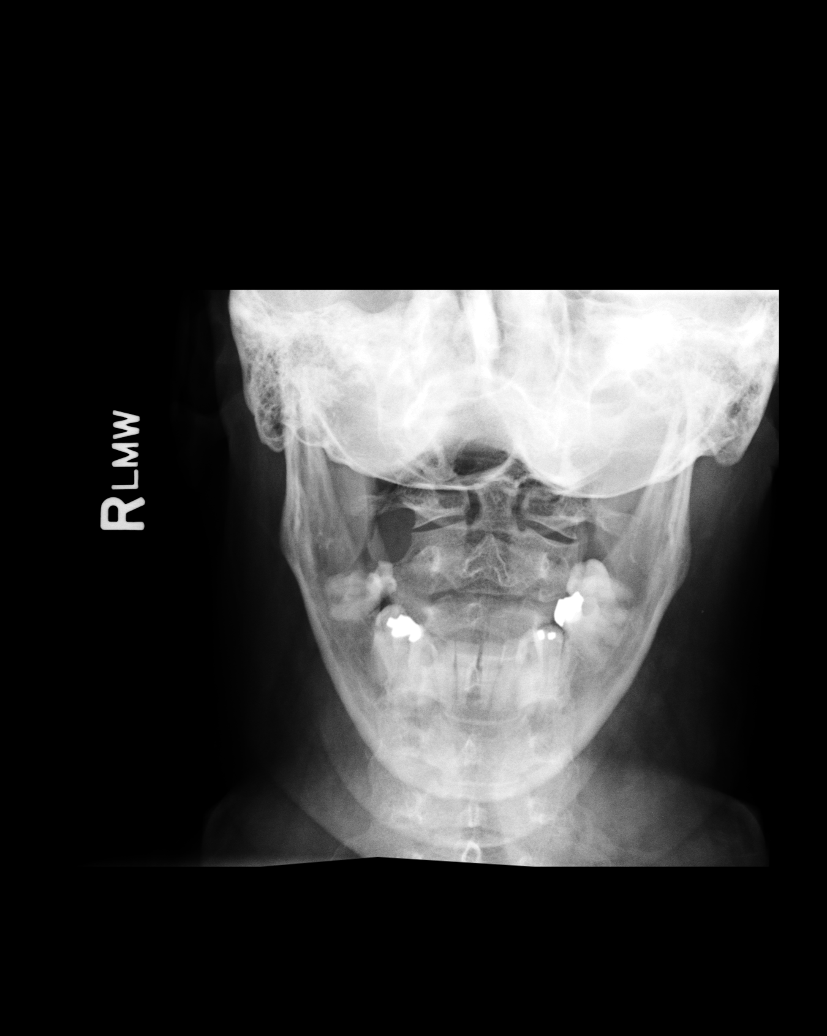

[5 of 5 positions shown; findings below may reference images not displayed]

IMPRESSION: Diffuse degenerative changes of the cervical spine. No
acute abnormality.

## 2012-06-05 ENCOUNTER — Emergency Department: Payer: Self-pay | Admitting: Emergency Medicine

## 2012-06-05 LAB — COMPREHENSIVE METABOLIC PANEL
Alkaline Phosphatase: 114 U/L (ref 50–136)
Anion Gap: 10 (ref 7–16)
BUN: 10 mg/dL (ref 7–18)
Bilirubin,Total: 0.2 mg/dL (ref 0.2–1.0)
Calcium, Total: 8.7 mg/dL (ref 8.5–10.1)
Chloride: 109 mmol/L — ABNORMAL HIGH (ref 98–107)
Co2: 24 mmol/L (ref 21–32)
Creatinine: 0.86 mg/dL (ref 0.60–1.30)
EGFR (African American): >60
EGFR (Non-African Amer.): >60
Glucose: 81 mg/dL (ref 65–99)
Potassium: 3.7 mmol/L (ref 3.5–5.1)
SGPT (ALT): 26 U/L (ref 12–78)
Total Protein: 7.7 g/dL (ref 6.4–8.2)

## 2012-06-05 LAB — CBC
HCT: 33.2 % — ABNORMAL LOW (ref 35.0–47.0)
HGB: 10.9 g/dL — ABNORMAL LOW (ref 12.0–16.0)
MCHC: 32.8 g/dL (ref 32.0–36.0)
MCV: 75 fL — ABNORMAL LOW (ref 80–100)
RDW: 17 % — ABNORMAL HIGH (ref 11.5–14.5)

## 2012-06-05 LAB — TROPONIN I: Troponin-I: <0.02 ng/mL

## 2012-07-12 ENCOUNTER — Ambulatory Visit: Payer: Self-pay | Admitting: Internal Medicine

## 2012-07-14 ENCOUNTER — Observation Stay: Payer: Self-pay | Admitting: Internal Medicine

## 2012-07-14 LAB — HEPATIC FUNCTION PANEL A (ARMC)
Albumin: 3.8 g/dL (ref 3.4–5.0)
Bilirubin,Total: 0.2 mg/dL (ref 0.2–1.0)
SGOT(AST): 23 U/L (ref 15–37)
SGPT (ALT): 30 U/L (ref 12–78)
Total Protein: 8.3 g/dL — ABNORMAL HIGH (ref 6.4–8.2)

## 2012-07-14 LAB — CBC
HCT: 35.1 % (ref 35.0–47.0)
HGB: 11.3 g/dL — ABNORMAL LOW (ref 12.0–16.0)
MCH: 24.6 pg — ABNORMAL LOW (ref 26.0–34.0)
MCHC: 32.3 g/dL (ref 32.0–36.0)
MCV: 76 fL — ABNORMAL LOW (ref 80–100)
RBC: 4.61 10*6/uL (ref 3.80–5.20)

## 2012-07-14 LAB — BASIC METABOLIC PANEL
Anion Gap: 8 (ref 7–16)
Calcium, Total: 9.1 mg/dL (ref 8.5–10.1)
Chloride: 106 mmol/L (ref 98–107)
Co2: 26 mmol/L (ref 21–32)
Creatinine: 0.99 mg/dL (ref 0.60–1.30)
EGFR (African American): >60
Glucose: 117 mg/dL — ABNORMAL HIGH (ref 65–99)
Osmolality: 279 (ref 275–301)
Sodium: 140 mmol/L (ref 136–145)

## 2012-07-14 LAB — FERRITIN: Ferritin (ARMC): 7 ng/mL — ABNORMAL LOW (ref 8–388)

## 2012-07-14 LAB — APTT: Activated PTT: 29.8 secs (ref 23.6–35.9)

## 2012-07-14 LAB — CK TOTAL AND CKMB (NOT AT ARMC)
CK, Total: 96 U/L (ref 21–215)
CK, Total: 77 U/L (ref 21–215)
CK-MB: 0.8 ng/mL (ref 0.5–3.6)

## 2012-07-14 LAB — PROTIME-INR: Prothrombin Time: 13.4 secs (ref 11.5–14.7)

## 2012-07-14 LAB — PRO B NATRIURETIC PEPTIDE: B-Type Natriuretic Peptide: 124 pg/mL (ref 0–125)

## 2012-07-15 LAB — CBC WITH DIFFERENTIAL/PLATELET
Basophil #: 0 10*3/uL (ref 0.0–0.1)
Basophil %: 0.6 %
Eosinophil #: 0.1 10*3/uL (ref 0.0–0.7)
HCT: 31.4 % — ABNORMAL LOW (ref 35.0–47.0)
Lymphocyte #: 1.1 10*3/uL (ref 1.0–3.6)
Lymphocyte %: 18.8 %
MCHC: 32.9 g/dL (ref 32.0–36.0)
MCV: 76 fL — ABNORMAL LOW (ref 80–100)
Monocyte %: 6.1 %
Neutrophil #: 4.4 10*3/uL (ref 1.4–6.5)
Neutrophil %: 72.3 %
Platelet: 249 10*3/uL (ref 150–440)
RBC: 4.14 10*6/uL (ref 3.80–5.20)
RDW: 16.9 % — ABNORMAL HIGH (ref 11.5–14.5)
WBC: 6.1 10*3/uL (ref 3.6–11.0)

## 2012-07-15 LAB — HEMOGLOBIN A1C: Hemoglobin A1C: 5.7 % (ref 4.2–6.3)

## 2012-07-15 LAB — BASIC METABOLIC PANEL
BUN: 8 mg/dL (ref 7–18)
Calcium, Total: 8.5 mg/dL (ref 8.5–10.1)
Co2: 25 mmol/L (ref 21–32)
Creatinine: 0.8 mg/dL (ref 0.60–1.30)
EGFR (Non-African Amer.): >60
Osmolality: 281 (ref 275–301)
Potassium: 3.9 mmol/L (ref 3.5–5.1)
Sodium: 142 mmol/L (ref 136–145)

## 2012-07-15 LAB — CK TOTAL AND CKMB (NOT AT ARMC): CK-MB: 0.9 ng/mL (ref 0.5–3.6)

## 2012-07-15 LAB — TROPONIN I: Troponin-I: <0.02 ng/mL

## 2012-07-15 LAB — LIPID PANEL
Cholesterol: 159 mg/dL (ref 0–200)
Ldl Cholesterol, Calc: 95 mg/dL (ref 0–100)

## 2012-08-03 ENCOUNTER — Emergency Department: Payer: Self-pay | Admitting: Unknown Physician Specialty

## 2012-08-30 ENCOUNTER — Ambulatory Visit: Payer: Self-pay | Admitting: Family Medicine

## 2012-09-03 ENCOUNTER — Ambulatory Visit: Payer: Self-pay | Admitting: Orthopedic Surgery

## 2012-09-05 HISTORY — PX: BREAST BIOPSY: SHX20

## 2012-09-07 ENCOUNTER — Ambulatory Visit: Payer: Self-pay | Admitting: Family Medicine

## 2012-11-14 ENCOUNTER — Emergency Department: Payer: Self-pay | Admitting: Emergency Medicine

## 2012-11-16 ENCOUNTER — Ambulatory Visit: Payer: Self-pay | Admitting: Otolaryngology

## 2012-11-16 LAB — T4, FREE: Free Thyroxine: 1.03 ng/dL (ref 0.76–1.46)

## 2012-11-16 LAB — TSH: Thyroid Stimulating Horm: 0.696 u[IU]/mL

## 2012-11-19 ENCOUNTER — Ambulatory Visit: Payer: Self-pay | Admitting: General Surgery

## 2012-11-23 ENCOUNTER — Encounter: Payer: Self-pay | Admitting: *Deleted

## 2012-11-23 DIAGNOSIS — N63 Unspecified lump in unspecified breast: Secondary | ICD-10-CM | POA: Insufficient documentation

## 2012-11-23 DIAGNOSIS — Z8614 Personal history of Methicillin resistant Staphylococcus aureus infection: Secondary | ICD-10-CM | POA: Insufficient documentation

## 2012-11-27 ENCOUNTER — Ambulatory Visit (INDEPENDENT_AMBULATORY_CARE_PROVIDER_SITE_OTHER): Payer: Managed Care, Other (non HMO) | Admitting: General Surgery

## 2012-11-27 ENCOUNTER — Encounter: Payer: Self-pay | Admitting: General Surgery

## 2012-11-27 ENCOUNTER — Other Ambulatory Visit: Payer: Self-pay

## 2012-11-27 VITALS — BP 120/80 | HR 86 | Resp 14 | Ht 65.0 in | Wt 227.0 lb

## 2012-11-27 DIAGNOSIS — N644 Mastodynia: Secondary | ICD-10-CM

## 2012-11-27 DIAGNOSIS — N63 Unspecified lump in unspecified breast: Secondary | ICD-10-CM

## 2012-11-27 HISTORY — DX: Unspecified lump in unspecified breast: N63.0

## 2012-11-27 NOTE — Progress Notes (Signed)
Patient ID: Brittany Nguyen, female   DOB: 06/17/1966, 47 y.o.   MRN: [id number]  Chief Complaint  Patient presents with  . Follow-up    2 month follow up right breast mammogram    HPI Brittany Nguyen is a 47 y.o. female.  Patient presents today for 2 month follow up right breast mammogram. Patient was previously seen in January for a follow up mammogram and right nipple pain. At that visit a fine needle aspiration of the right breast was performed. Patient states that she is still continuing to have pain in the right breast. She states this hasn't changed since last visit in January.  HPI  Past Medical History  Diagnosis Date  . Hx MRSA infection   . Asthma 2013  . Heart disease 2013  . Diverticulitis 2010  . Lump or mass in breast   . Endometriosis 1990    Past Surgical History  Procedure Laterality Date  . Abdominal hysterectomy      age 60  . Nasal sinus surgery  2012    Family History  Problem Relation Age of Onset  . Cancer Mother 30    ovarian  . Cancer Father 1    brain  . Cancer Daughter 18    skin  . Cancer Maternal Aunt 46    breast    Social History History  Substance Use Topics  . Smoking status: Never Smoker   . Smokeless tobacco: Never Used  . Alcohol Use: No    Allergies  Allergen Reactions  . Contrast Media (Iodinated Diagnostic Agents) Shortness Of Breath  . Latex Hives  . Penicillins Hives  . Povidone Rash    Current Outpatient Prescriptions  Medication Sig Dispense Refill  . furosemide (LASIX) 20 MG tablet Take 20 mg by mouth daily.      Marland Kitchen rOPINIRole (REQUIP) 2 MG tablet Take 2 mg by mouth at bedtime.       No current facility-administered medications for this visit.    Review of Systems Review of Systems  Constitutional: Negative.   Respiratory: Negative.   Cardiovascular: Negative.     Blood pressure 120/80, pulse 86, resp. rate 14, height 5\' 5"  (1.651 m), weight 227 lb (102.967 kg).  Physical Exam Physical Exam   Constitutional: She appears well-developed and well-nourished.  Neck: No thyromegaly present.  Pulmonary/Chest: Right breast exhibits no inverted nipple, no mass, no nipple discharge, no skin change and no tenderness. Left breast exhibits no inverted nipple, no mass, no nipple discharge, no skin change and no tenderness. Breasts are symmetrical.  Lymphadenopathy:    She has no cervical adenopathy.    She has no axillary adenopathy.    Data Reviewed Right mammogram again shows a small nodule lateral right breast. [place] was performed  Assessment    Core biopsy of right breast mass performed and clip placed. The mass resolved after 2 passes.with core needle     Plan    If path is benign will recheck in 1 mo with another right mammogram        Darnelle Catalan 11/27/2012, 9:14 AM

## 2012-11-27 NOTE — Procedures (Signed)
With US guidance Bard core biopsy needle was used to obtain biopsy from right breast lesion. The lesion resolved after 2 passes with the needle. Anesthetic- 8 ml 1% xylocaine with 0.5% Marcaine. Approach-LM. Clip utilized. No immediate problems post procedure.

## 2012-11-27 NOTE — Patient Instructions (Addendum)
CARE AFTER BREAST BIOPSY  1. Leave the dressing on that your doctor applied after surgery. It is waterproof. You may bathe, shower and/or swim. The dressing will probably remain intact until your return office visit. If the dressing comes off, you will see small strips of tape against your skin on the incision. Do not remove these strips.  2. You may want to use a gauze,cloth or similar protection in your bra to prevent rubbing against your dressing and incision. This is not necessary, but you may feel more comfortable doing so.  3. It is recommended that you wear a bra day and night to give support to the breast. This will prevent the weight of the breast from pulling on the incision.  4. Your breast will feel hard and lumpy under the incision. Do not be alarmed. This is the underlying stitching of tissue. Softening of this tissue will occur in time.  5. Make sure you call the office and schedule an appointment in one week after your surgery. The office phone number is [phone]. The [facility] may have already done this for you.  6. You will notice about a week after your office visit that the strips of the tape on your incision will begin to loosen. These may then be removed.  7. Report to your doctor any of the following:  * Severe pain not relieved by your pain medication  *Redness of the incision  * Drainage from the incision  *Fever greater than 101 degrees  Patient has been scheduled for a unilateral right breast diagnostic mammogram at [address] [facility] for 01-01-13 at 11:30 am. She is aware of date, time, and instructions.

## 2012-11-28 ENCOUNTER — Telehealth: Payer: Self-pay | Admitting: *Deleted

## 2012-11-28 ENCOUNTER — Encounter: Payer: Self-pay | Admitting: *Deleted

## 2012-11-28 LAB — PATHOLOGY

## 2012-11-28 NOTE — Telephone Encounter (Signed)
Patient called wanting to see if 11-27-12 biopsy results were available. I do not see that they are in yet. Please call patient once you receive. Thanks.

## 2012-11-28 NOTE — Telephone Encounter (Signed)
Left message to call office for biopsy results, benign cyst, per Dr Evette Cristal. F/U one month as scheduled.

## 2012-11-28 NOTE — Telephone Encounter (Signed)
This encounter was created in error - please disregard.

## 2012-11-29 NOTE — Telephone Encounter (Signed)
Notified pt of biopsy results, aware of F/U appt. Pt pleased.

## 2012-12-14 ENCOUNTER — Emergency Department: Payer: Self-pay | Admitting: Emergency Medicine

## 2012-12-14 LAB — COMPREHENSIVE METABOLIC PANEL
Albumin: 3.4 g/dL (ref 3.4–5.0)
Alkaline Phosphatase: 126 U/L (ref 50–136)
BUN: 12 mg/dL (ref 7–18)
Bilirubin,Total: 0.1 mg/dL — ABNORMAL LOW (ref 0.2–1.0)
Calcium, Total: 8.9 mg/dL (ref 8.5–10.1)
Creatinine: 0.95 mg/dL (ref 0.60–1.30)
EGFR (African American): >60
EGFR (Non-African Amer.): >60
SGOT(AST): 19 U/L (ref 15–37)
SGPT (ALT): 25 U/L (ref 12–78)
Total Protein: 7.5 g/dL (ref 6.4–8.2)

## 2012-12-14 LAB — CBC
HCT: 35.1 % (ref 35.0–47.0)
HGB: 11.6 g/dL — ABNORMAL LOW (ref 12.0–16.0)
MCH: 25.5 pg — ABNORMAL LOW (ref 26.0–34.0)
Platelet: 297 10*3/uL (ref 150–440)
RBC: 4.54 10*6/uL (ref 3.80–5.20)
RDW: 17.2 % — ABNORMAL HIGH (ref 11.5–14.5)
WBC: 11.1 10*3/uL — ABNORMAL HIGH (ref 3.6–11.0)

## 2012-12-14 LAB — CK TOTAL AND CKMB (NOT AT ARMC): CK-MB: 0.7 ng/mL (ref 0.5–3.6)

## 2012-12-14 LAB — PROTIME-INR
INR: 1
Prothrombin Time: 13.5 secs (ref 11.5–14.7)

## 2012-12-14 LAB — APTT: Activated PTT: 25.8 secs (ref 23.6–35.9)

## 2012-12-14 LAB — TROPONIN I: Troponin-I: <0.02 ng/mL

## 2012-12-14 LAB — PRO B NATRIURETIC PEPTIDE: B-Type Natriuretic Peptide: 182 pg/mL — ABNORMAL HIGH (ref 0–125)

## 2012-12-15 ENCOUNTER — Observation Stay: Payer: Self-pay | Admitting: Family Medicine

## 2012-12-15 LAB — TROPONIN I: Troponin-I: <0.02 ng/mL

## 2012-12-15 LAB — CK TOTAL AND CKMB (NOT AT ARMC)
CK, Total: 61 U/L (ref 21–215)
CK-MB: 0.6 ng/mL (ref 0.5–3.6)

## 2012-12-16 LAB — CBC WITH DIFFERENTIAL/PLATELET
Basophil #: 0.1 10*3/uL (ref 0.0–0.1)
Eosinophil #: 0 10*3/uL (ref 0.0–0.7)
Eosinophil %: 0 %
HGB: 10.7 g/dL — ABNORMAL LOW (ref 12.0–16.0)
Lymphocyte #: 0.7 10*3/uL — ABNORMAL LOW (ref 1.0–3.6)
Lymphocyte %: 5.1 %
MCH: 24.9 pg — ABNORMAL LOW (ref 26.0–34.0)
Monocyte #: 0.2 x10 3/mm (ref 0.2–0.9)
Monocyte %: 1.3 %
Neutrophil #: 13 10*3/uL — ABNORMAL HIGH (ref 1.4–6.5)
Neutrophil %: 93.2 %
Platelet: 262 10*3/uL (ref 150–440)
RBC: 4.31 10*6/uL (ref 3.80–5.20)
RDW: 16.6 % — ABNORMAL HIGH (ref 11.5–14.5)
WBC: 13.9 10*3/uL — ABNORMAL HIGH (ref 3.6–11.0)

## 2012-12-16 LAB — BASIC METABOLIC PANEL
Anion Gap: 11 (ref 7–16)
Co2: 24 mmol/L (ref 21–32)
Creatinine: 1.21 mg/dL (ref 0.60–1.30)
Potassium: 3.7 mmol/L (ref 3.5–5.1)
Sodium: 138 mmol/L (ref 136–145)

## 2012-12-16 LAB — LIPID PANEL
Cholesterol: 198 mg/dL (ref 0–200)
HDL Cholesterol: 51 mg/dL (ref 40–60)
Ldl Cholesterol, Calc: 124 mg/dL — ABNORMAL HIGH (ref 0–100)

## 2012-12-16 LAB — APTT: Activated PTT: 47.7 secs — ABNORMAL HIGH (ref 23.6–35.9)

## 2012-12-17 LAB — BASIC METABOLIC PANEL
Anion Gap: 5 — ABNORMAL LOW (ref 7–16)
BUN: 18 mg/dL (ref 7–18)
Calcium, Total: 8.8 mg/dL (ref 8.5–10.1)
Chloride: 105 mmol/L (ref 98–107)
Creatinine: 1.03 mg/dL (ref 0.60–1.30)
EGFR (Non-African Amer.): >60
Glucose: 149 mg/dL — ABNORMAL HIGH (ref 65–99)
Osmolality: 284 (ref 275–301)
Sodium: 140 mmol/L (ref 136–145)

## 2012-12-28 IMAGING — US US EXTREM LOW VENOUS*L*
1 series · 17 of 21 positions shown · non-contrast
Comparison: none

REASON FOR EXAM: CR [id number] pain swelling  hist DVTeval for DVT
COMMENTS:

[Series 1: us extrem low venous*left* · 17 of 21 slices shown]
[im 1/21]
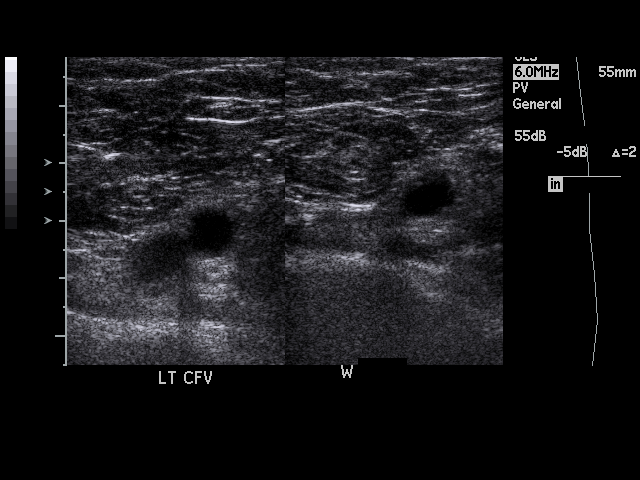
[im 2/21]
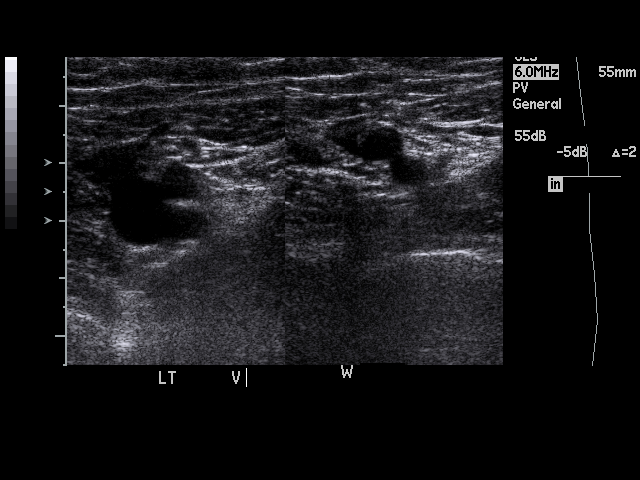
[im 4/21]
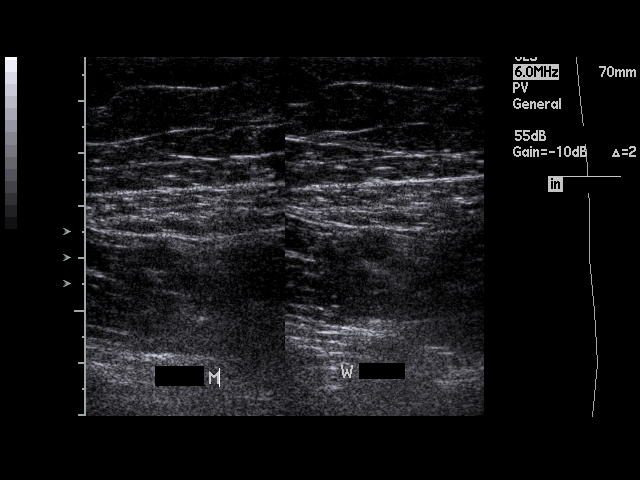
[im 5/21]
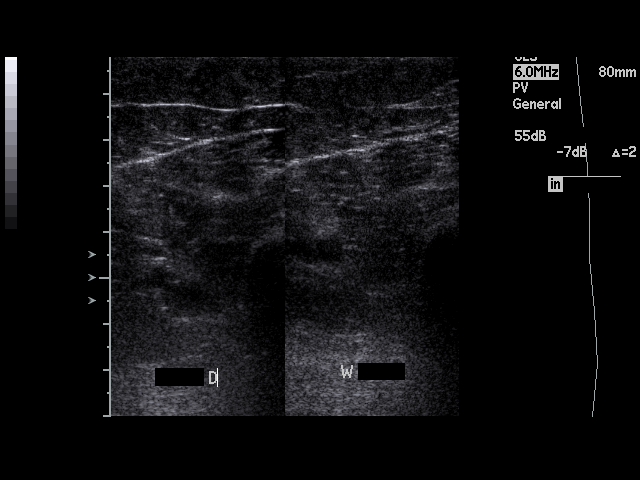
[im 6/21]
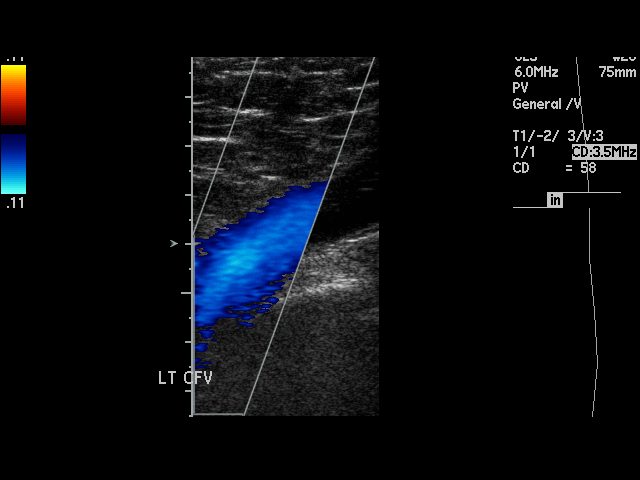
[im 7/21]
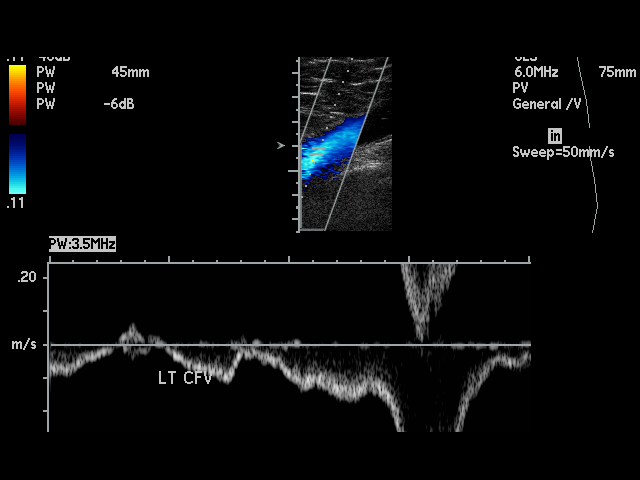
[im 9/21]
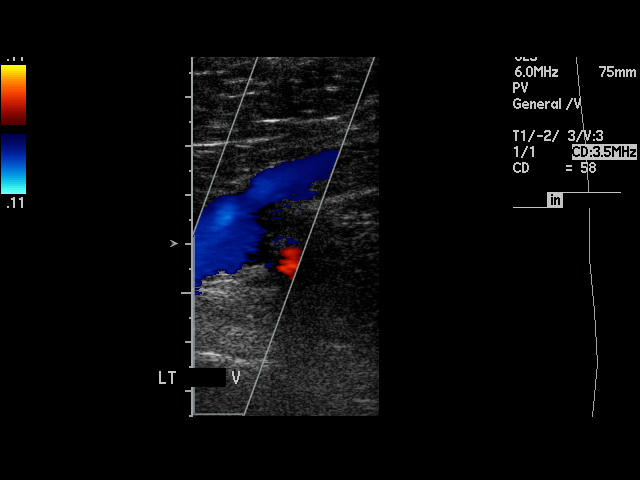
[im 10/21]
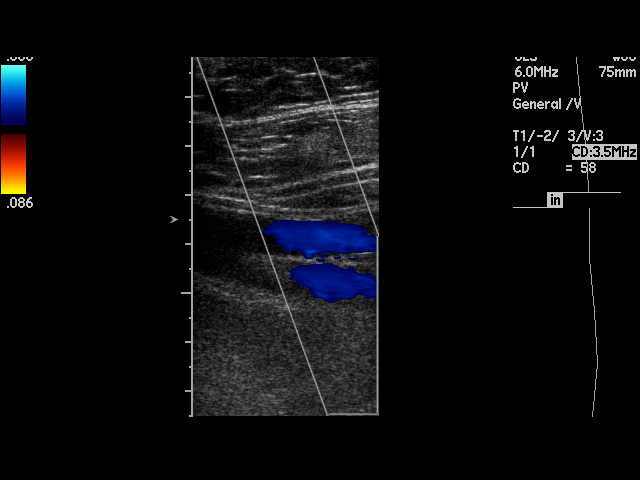
[im 11/21]
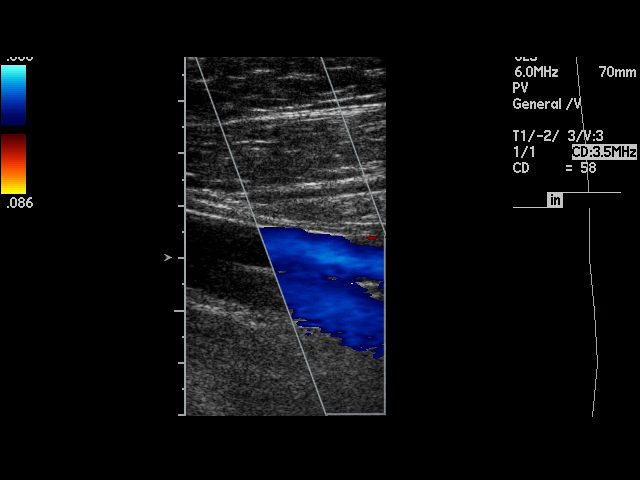
[im 12/21]
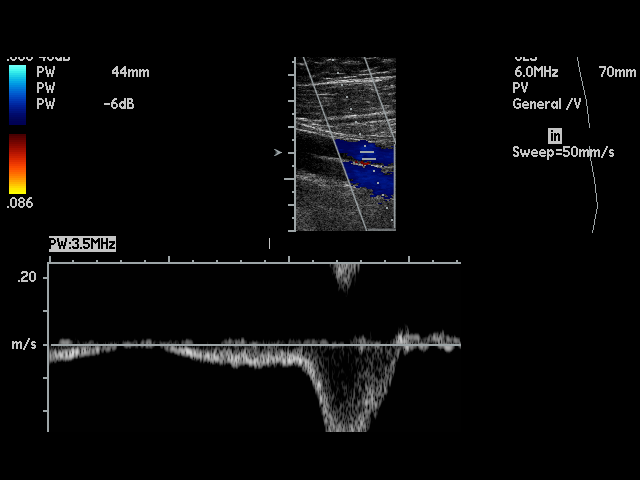
[im 13/21]
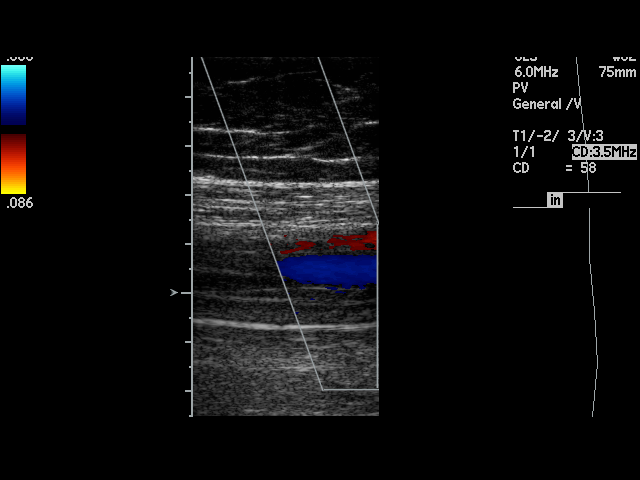
[im 15/21]
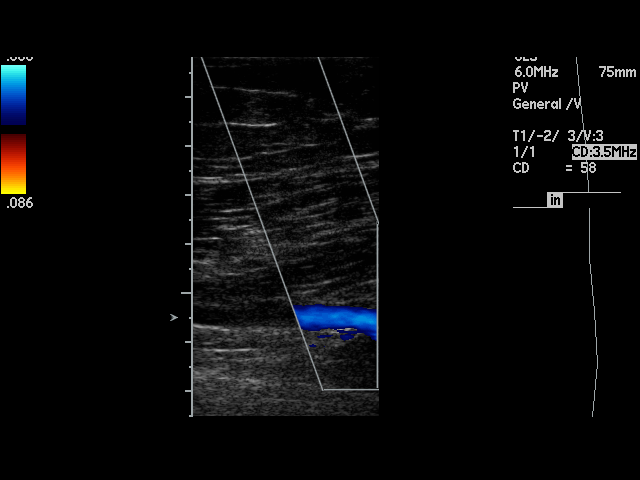
[im 16/21]
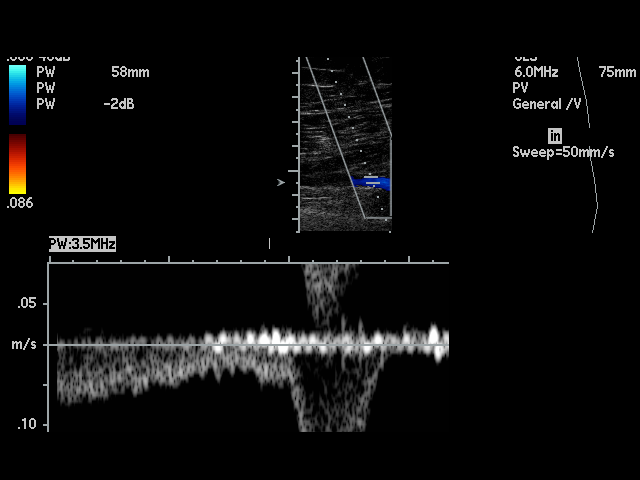
[im 17/21]
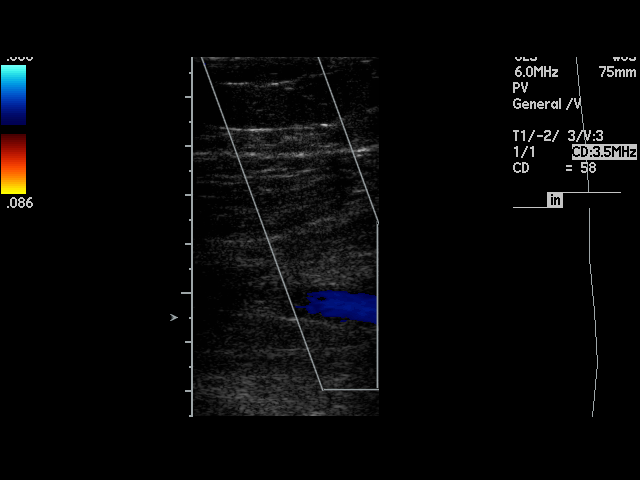
[im 18/21]
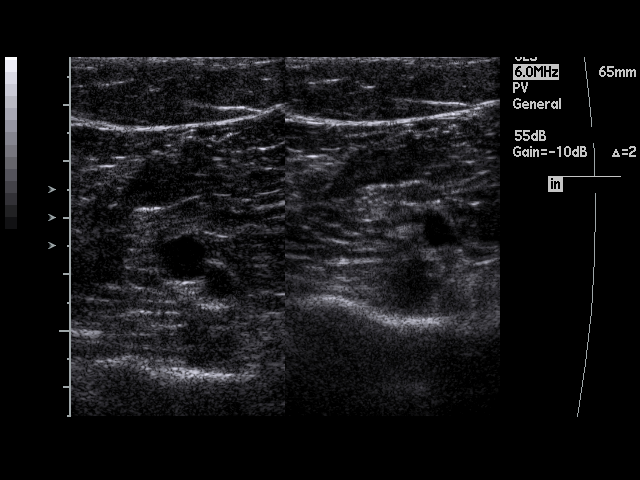
[im 20/21]
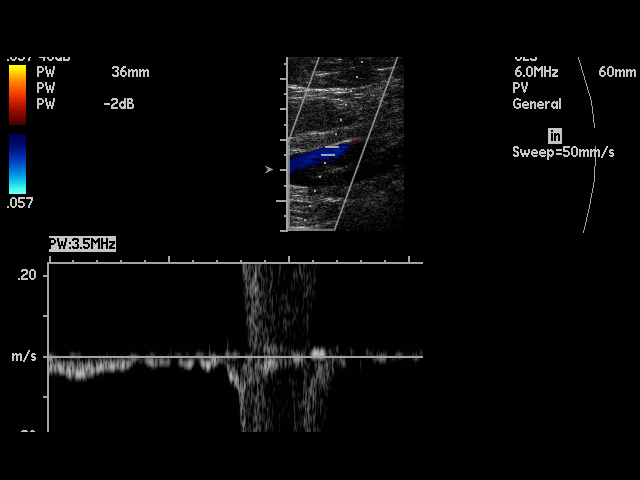
[im 21/21]
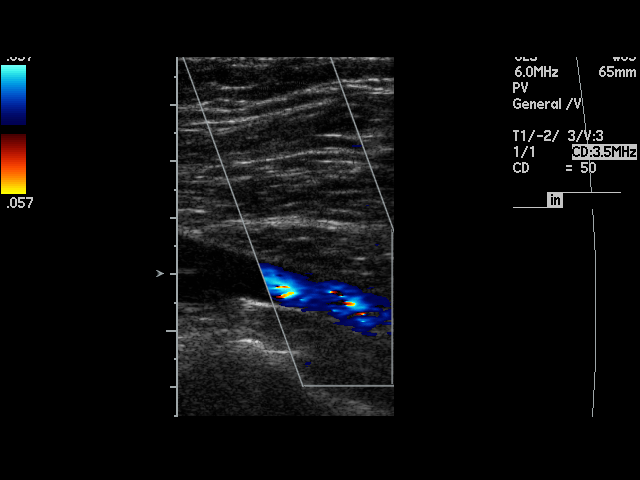

[17 of 21 positions shown; findings below may reference images not displayed]

PROCEDURE:     US  - US DOPPLER LOW EXTR LEFT  - October 13, 2011  [DATE]

RESULT:     Comparison: None

Technique and findings: Multiple longitudinal and transverse grayscale as
well as color and spectral Doppler images of the left lower extremity veins
were obtained from the common femoral veins through the popliteal veins.

The left common femoral, femoral, and popliteal veins are patent,
demonstrating normal color-flow and compressibility. No intraluminal
thrombus is identified.  There is normal respiratory variation and
augmentation demonstrated at all vein levels.
IMPRESSION: No evidence of DVT in the left lower extremity.

## 2013-01-01 ENCOUNTER — Ambulatory Visit: Payer: Self-pay | Admitting: General Surgery

## 2013-01-07 ENCOUNTER — Encounter: Payer: Self-pay | Admitting: General Surgery

## 2013-01-10 ENCOUNTER — Other Ambulatory Visit: Payer: Self-pay | Admitting: *Deleted

## 2013-01-10 ENCOUNTER — Ambulatory Visit (INDEPENDENT_AMBULATORY_CARE_PROVIDER_SITE_OTHER): Payer: Managed Care, Other (non HMO) | Admitting: General Surgery

## 2013-01-10 ENCOUNTER — Encounter: Payer: Self-pay | Admitting: General Surgery

## 2013-01-10 VITALS — BP 108/76 | HR 66 | Resp 12 | Ht 65.0 in | Wt 220.0 lb

## 2013-01-10 DIAGNOSIS — N644 Mastodynia: Secondary | ICD-10-CM

## 2013-01-10 DIAGNOSIS — N63 Unspecified lump in unspecified breast: Secondary | ICD-10-CM

## 2013-01-10 DIAGNOSIS — Z1231 Encounter for screening mammogram for malignant neoplasm of breast: Secondary | ICD-10-CM

## 2013-01-10 NOTE — Progress Notes (Signed)
Patient ID: Brittany Nguyen, female   DOB: Oct 17, 1965, 47 y.o.   MRN: [id number]  Chief Complaint  Patient presents with  . Other    mammo    HPI Brittany Nguyen is a 47 y.o. female here today following up from her mammogram done on 01/01/13 cat 2 .  Patient was previously seen in January for a follow up mammogram and right nipple pain. At that visit a fine needle aspiration of the right breast was performed. Patient states that she is not having no more right breast pain.. She states this hasn't changed since last visit in January. Patient return in March 2014 for her recheck . HPI  HPI  Past Medical History  Diagnosis Date  . Hx MRSA infection   . Asthma 2013  . Heart disease 2013  . Diverticulitis 2010  . Lump or mass in breast   . Endometriosis 1990    Past Surgical History  Procedure Laterality Date  . Abdominal hysterectomy      age 59  . Nasal sinus surgery  2012    Family History  Problem Relation Age of Onset  . Cancer Mother 30    ovarian  . Cancer Father 76    brain  . Cancer Daughter 18    skin  . Cancer Maternal Aunt 58    breast    Social History History  Substance Use Topics  . Smoking status: Never Smoker   . Smokeless tobacco: Never Used  . Alcohol Use: No    Allergies  Allergen Reactions  . Contrast Media (Iodinated Diagnostic Agents) Shortness Of Breath  . Latex Hives  . Penicillins Hives  . Povidone Rash    Current Outpatient Prescriptions  Medication Sig Dispense Refill  . furosemide (LASIX) 20 MG tablet Take 20 mg by mouth daily.      Marland Kitchen rOPINIRole (REQUIP) 2 MG tablet Take 2 mg by mouth at bedtime.       No current facility-administered medications for this visit.    Review of Systems Review of Systems  Constitutional: Negative.   Respiratory: Negative.   Cardiovascular: Negative.     Blood pressure 108/76, pulse 66, resp. rate 12, height 5\' 5"  (1.651 m), weight 220 lb (99.791 kg).  Physical Exam Physical Exam  Constitutional:  She appears well-developed and well-nourished.  Neck: Neck supple.  Pulmonary/Chest: Right breast exhibits no inverted nipple, no mass, no nipple discharge, no skin change and no tenderness.  Lymphadenopathy:    She has no cervical adenopathy.    She has no axillary adenopathy.    Data Reviewe Mammogram reviewed,clip in place no nodule seen  Assessment    Stable,      Plan    Patient to return in January 2015 with  bilateral screening mammogram        SANKAR,SEEPLAPUTHUR G 01/11/2013, 10:28 AM

## 2013-01-10 NOTE — Patient Instructions (Addendum)
Return in January 2015 with  bilateral screening mammogram

## 2013-01-10 NOTE — Progress Notes (Signed)
Patient will be asked to return to the office in January 2015 for a bilateral screening mammogram.

## 2013-01-11 ENCOUNTER — Encounter: Payer: Self-pay | Admitting: General Surgery

## 2013-01-11 DIAGNOSIS — N644 Mastodynia: Secondary | ICD-10-CM | POA: Insufficient documentation

## 2013-02-02 IMAGING — CT CT CHEST W/ CM
1 series · 15 of 34 positions shown, 19 images · IV contrast (APPLIED)
Comparison: none

REASON FOR EXAM: severe chest pain radiating to back
COMMENTS:

[Series 4: soft tissue · axial · 0.67mm/px · z∈[-351,-108]mm · 15 of 97 slices shown, 19 images]
[im 8/97  mediastinal]
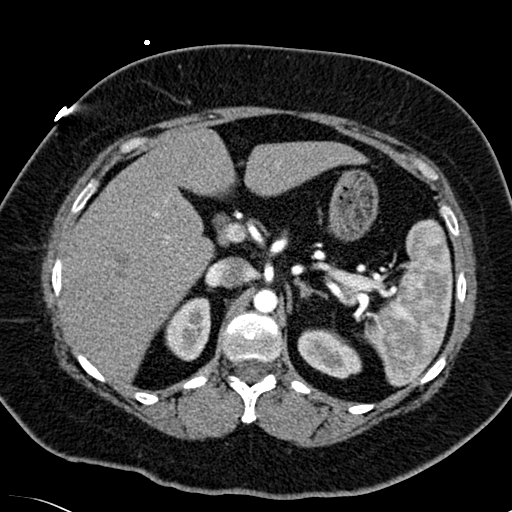
[im 8/97  lung]
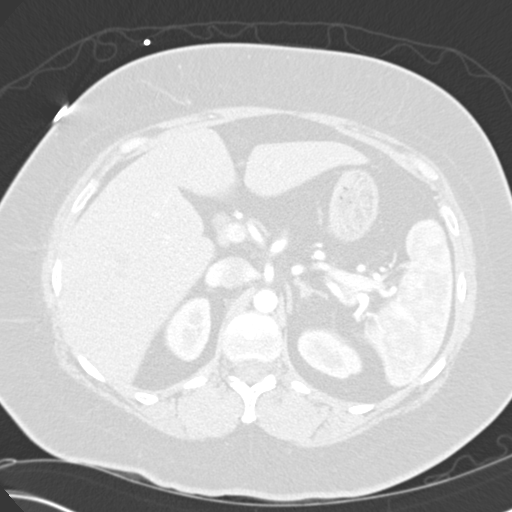
[im 15/97  lung]
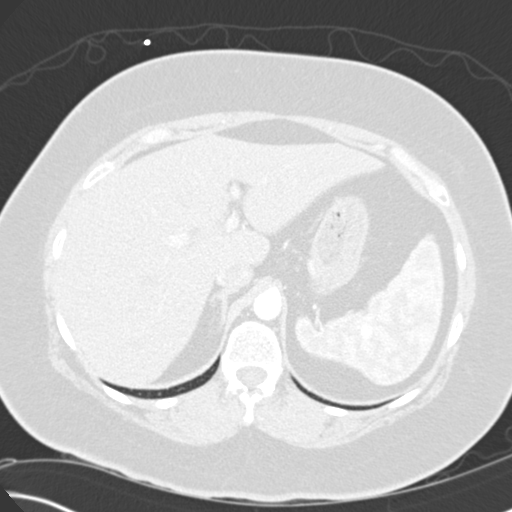
[im 20/97  lung]
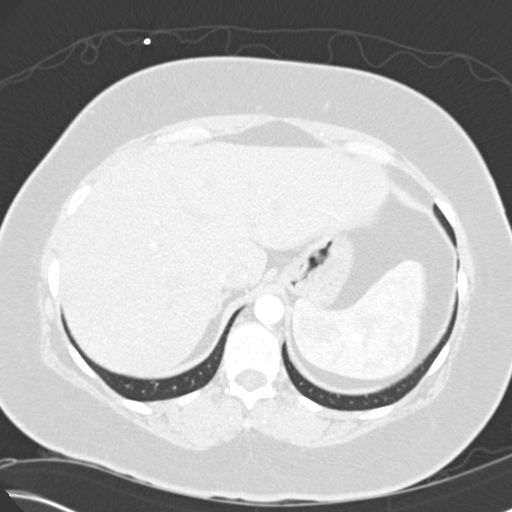
[im 25/97  lung]
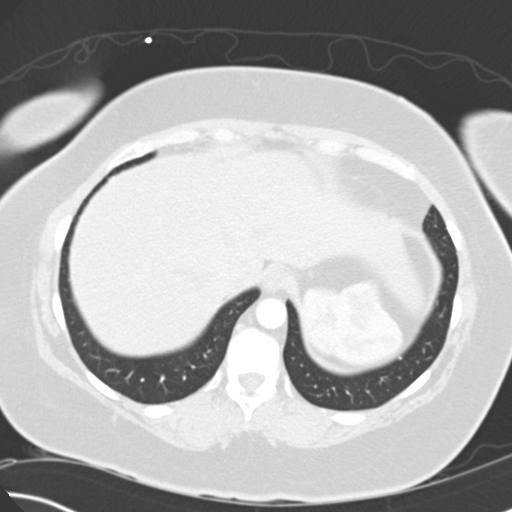
[im 33/97  mediastinal]
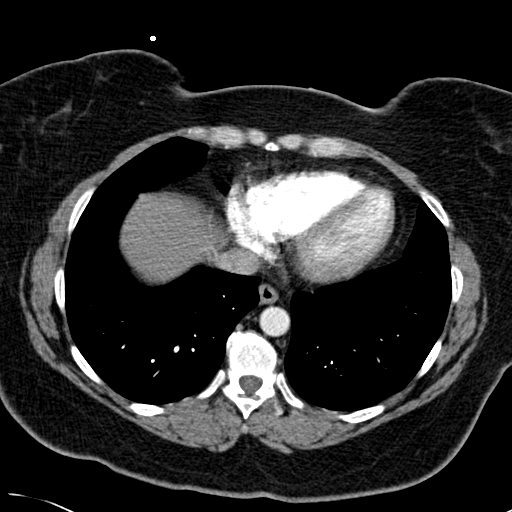
[im 33/97  lung]
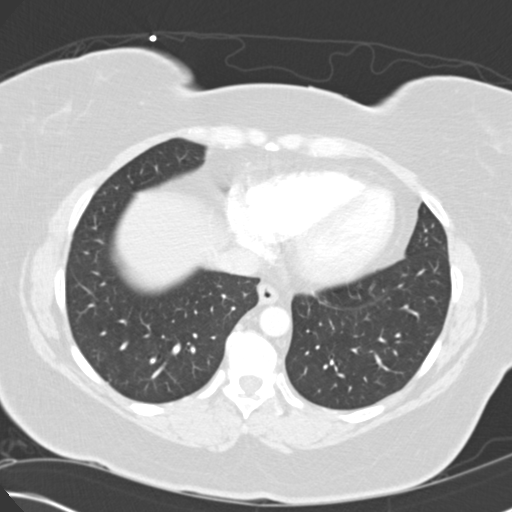
[im 39/97  lung]
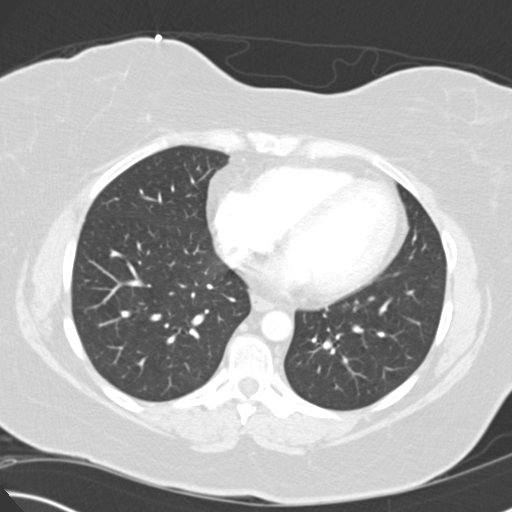
[im 43/97  lung]
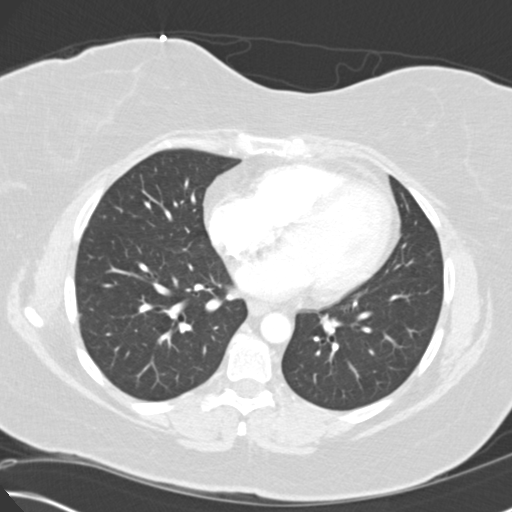
[im 50/97  lung]
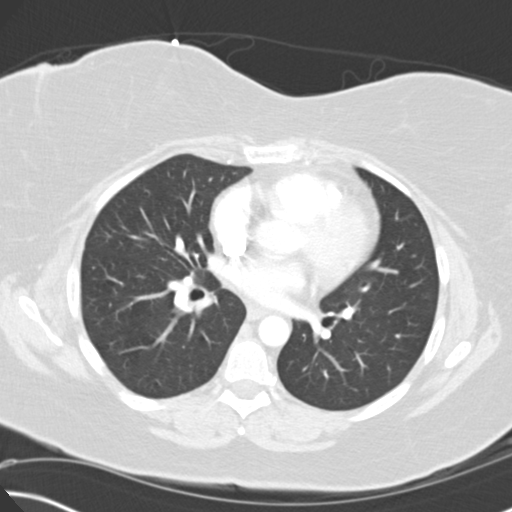
[im 54/97  mediastinal]
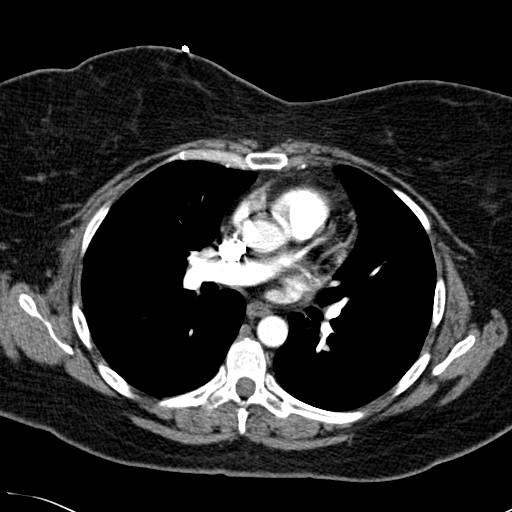
[im 54/97  lung]
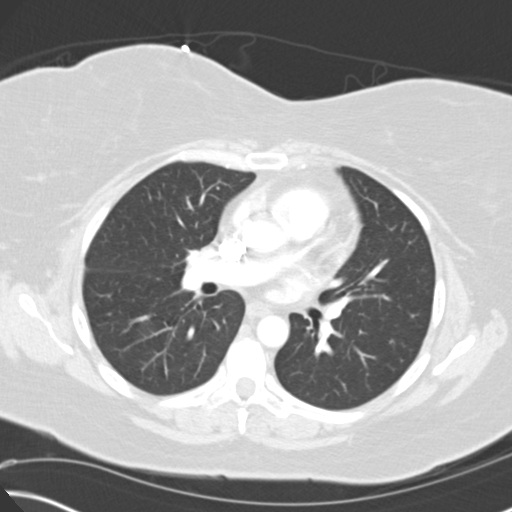
[im 58/97  lung]
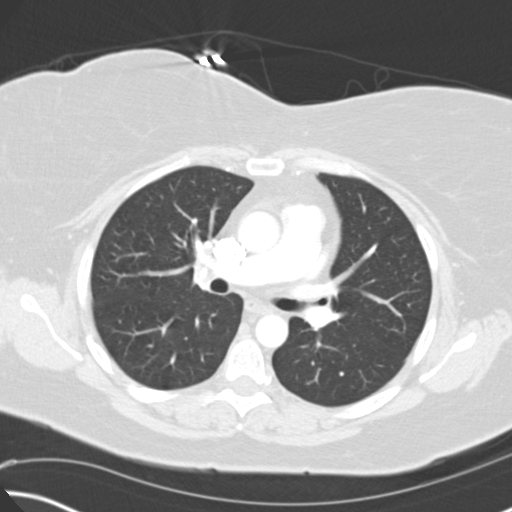
[im 65/97  lung]
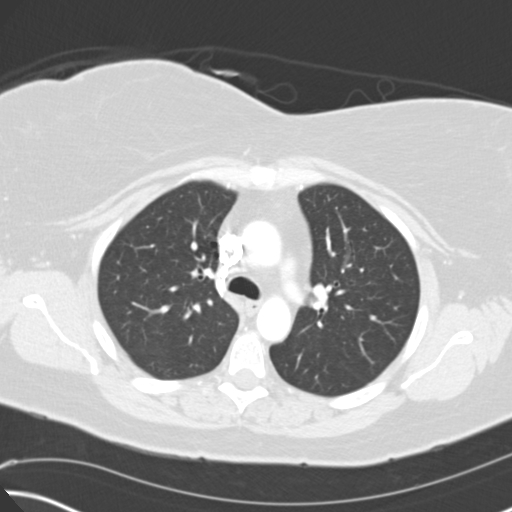
[im 72/97  lung]
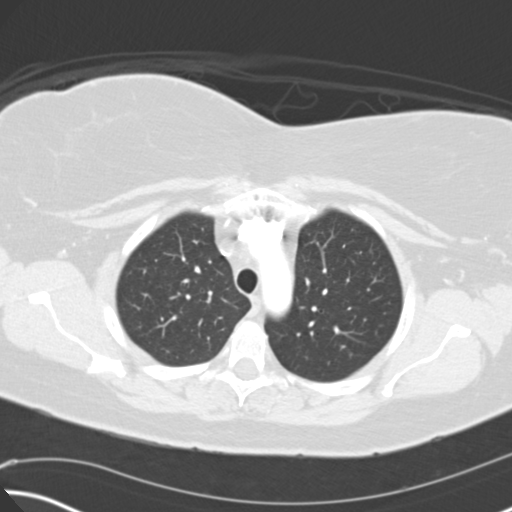
[im 77/97  mediastinal]
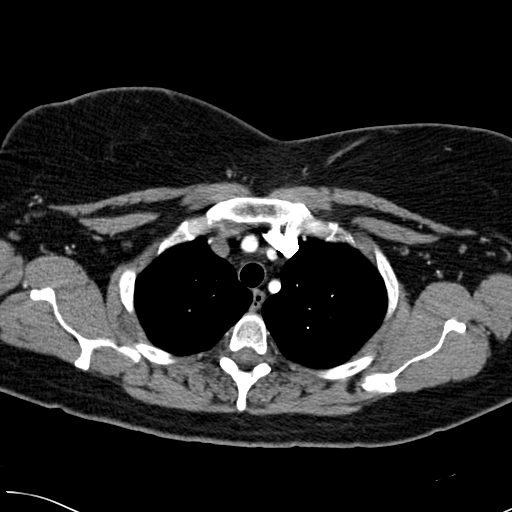
[im 77/97  lung]
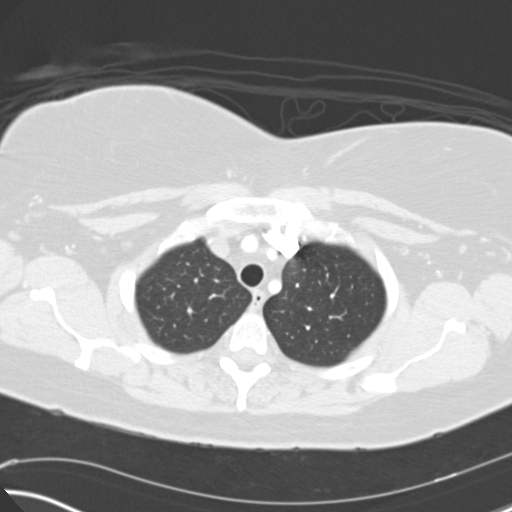
[im 82/97  lung]
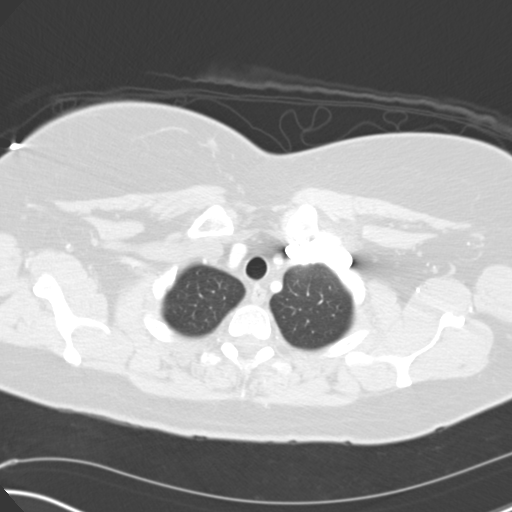
[im 89/97  lung]
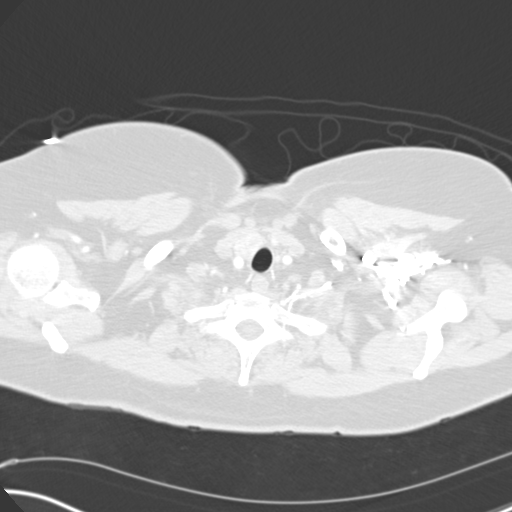

[15 of 34 positions shown; findings below may reference images not displayed]

PROCEDURE:     CT  - CT CHEST (FOR PE) W  - November 18, 2011 [DATE]

RESULT:     Axial CT scanning was performed through the chest with
reconstructions at 3 mm intervals and slice thicknesses following
intravenous administration of 100 cc of Fsovue-86I. Review of multiplanar
reconstructed images was performed separately on the VIA monitor. Comparison
is made to the study June 29, 2010.

Contrast within the pulmonary arterial tree is normal in appearance. I do
not see evidence of an acute pulmonary embolism. The caliber of the thoracic
aorta is normal. There is no evidence of a false lumen. The cardiac chambers
are normal in size. There is no pleural nor pericardial effusion. I see no
mediastinal nor hilar lymphadenopathy. The right thyroid lobe is larger than
the left and it is somewhat heterogeneous in density.

At lung window settings I see no interstitial nor alveolar infiltrates.
There is no evidence of a pneumothorax. No pulmonary parenchymal masses are
demonstrated.

Within the upper abdomen the observed portions of the liver and spleen and
adrenal glands are within the limits of normal.
IMPRESSION: 1. I do not see evidence of acute pulmonary embolism.
2. I do not see acute thoracic abnormality otherwise.

A preliminary report was sent to the [HOSPITAL] the conclusion
of the study.

## 2013-05-31 LAB — CK TOTAL AND CKMB (NOT AT ARMC)
CK, Total: 109 U/L (ref 21–215)
CK-MB: <0.5 ng/mL — ABNORMAL LOW (ref 0.5–3.6)

## 2013-05-31 LAB — BASIC METABOLIC PANEL
Anion Gap: 6 — ABNORMAL LOW (ref 7–16)
BUN: 10 mg/dL (ref 7–18)
Calcium, Total: 9.1 mg/dL (ref 8.5–10.1)
Chloride: 104 mmol/L (ref 98–107)
Creatinine: 1.13 mg/dL (ref 0.60–1.30)
Glucose: 143 mg/dL — ABNORMAL HIGH (ref 65–99)
Potassium: 3.4 mmol/L — ABNORMAL LOW (ref 3.5–5.1)
Sodium: 136 mmol/L (ref 136–145)

## 2013-05-31 LAB — CBC
HCT: 35.5 % (ref 35.0–47.0)
MCH: 25.3 pg — ABNORMAL LOW (ref 26.0–34.0)
Platelet: 290 10*3/uL (ref 150–440)
RBC: 4.65 10*6/uL (ref 3.80–5.20)

## 2013-05-31 LAB — TROPONIN I: Troponin-I: <0.02 ng/mL

## 2013-05-31 LAB — PRO B NATRIURETIC PEPTIDE: B-Type Natriuretic Peptide: 59 pg/mL (ref 0–125)

## 2013-05-31 LAB — PROTIME-INR
INR: 1
Prothrombin Time: 13 secs (ref 11.5–14.7)

## 2013-06-01 ENCOUNTER — Observation Stay: Payer: Self-pay | Admitting: Student

## 2013-06-01 LAB — CK TOTAL AND CKMB (NOT AT ARMC)
CK, Total: 90 U/L (ref 21–215)
CK, Total: 79 U/L (ref 21–215)
CK-MB: <0.5 ng/mL — ABNORMAL LOW (ref 0.5–3.6)
CK-MB: 1.2 ng/mL (ref 0.5–3.6)

## 2013-06-01 LAB — TROPONIN I
Troponin-I: <0.02 ng/mL
Troponin-I: <0.02 ng/mL

## 2013-06-02 LAB — LIPID PANEL
Cholesterol: 174 mg/dL (ref 0–200)
Ldl Cholesterol, Calc: 96 mg/dL (ref 0–100)
Triglycerides: 220 mg/dL — ABNORMAL HIGH (ref 0–200)

## 2013-07-13 ENCOUNTER — Emergency Department: Payer: Self-pay | Admitting: Emergency Medicine

## 2013-08-21 IMAGING — CR DG CHEST 2V
1 series · 2 of 2 positions shown · non-contrast
Comparison: none

REASON FOR EXAM: chest pain, sob, h/o mi
COMMENTS:   May transport without cardiac monitor

[Series 1: w chest pa · 0.14mm/px · 2 of 2 slices shown]
[im 1/2]
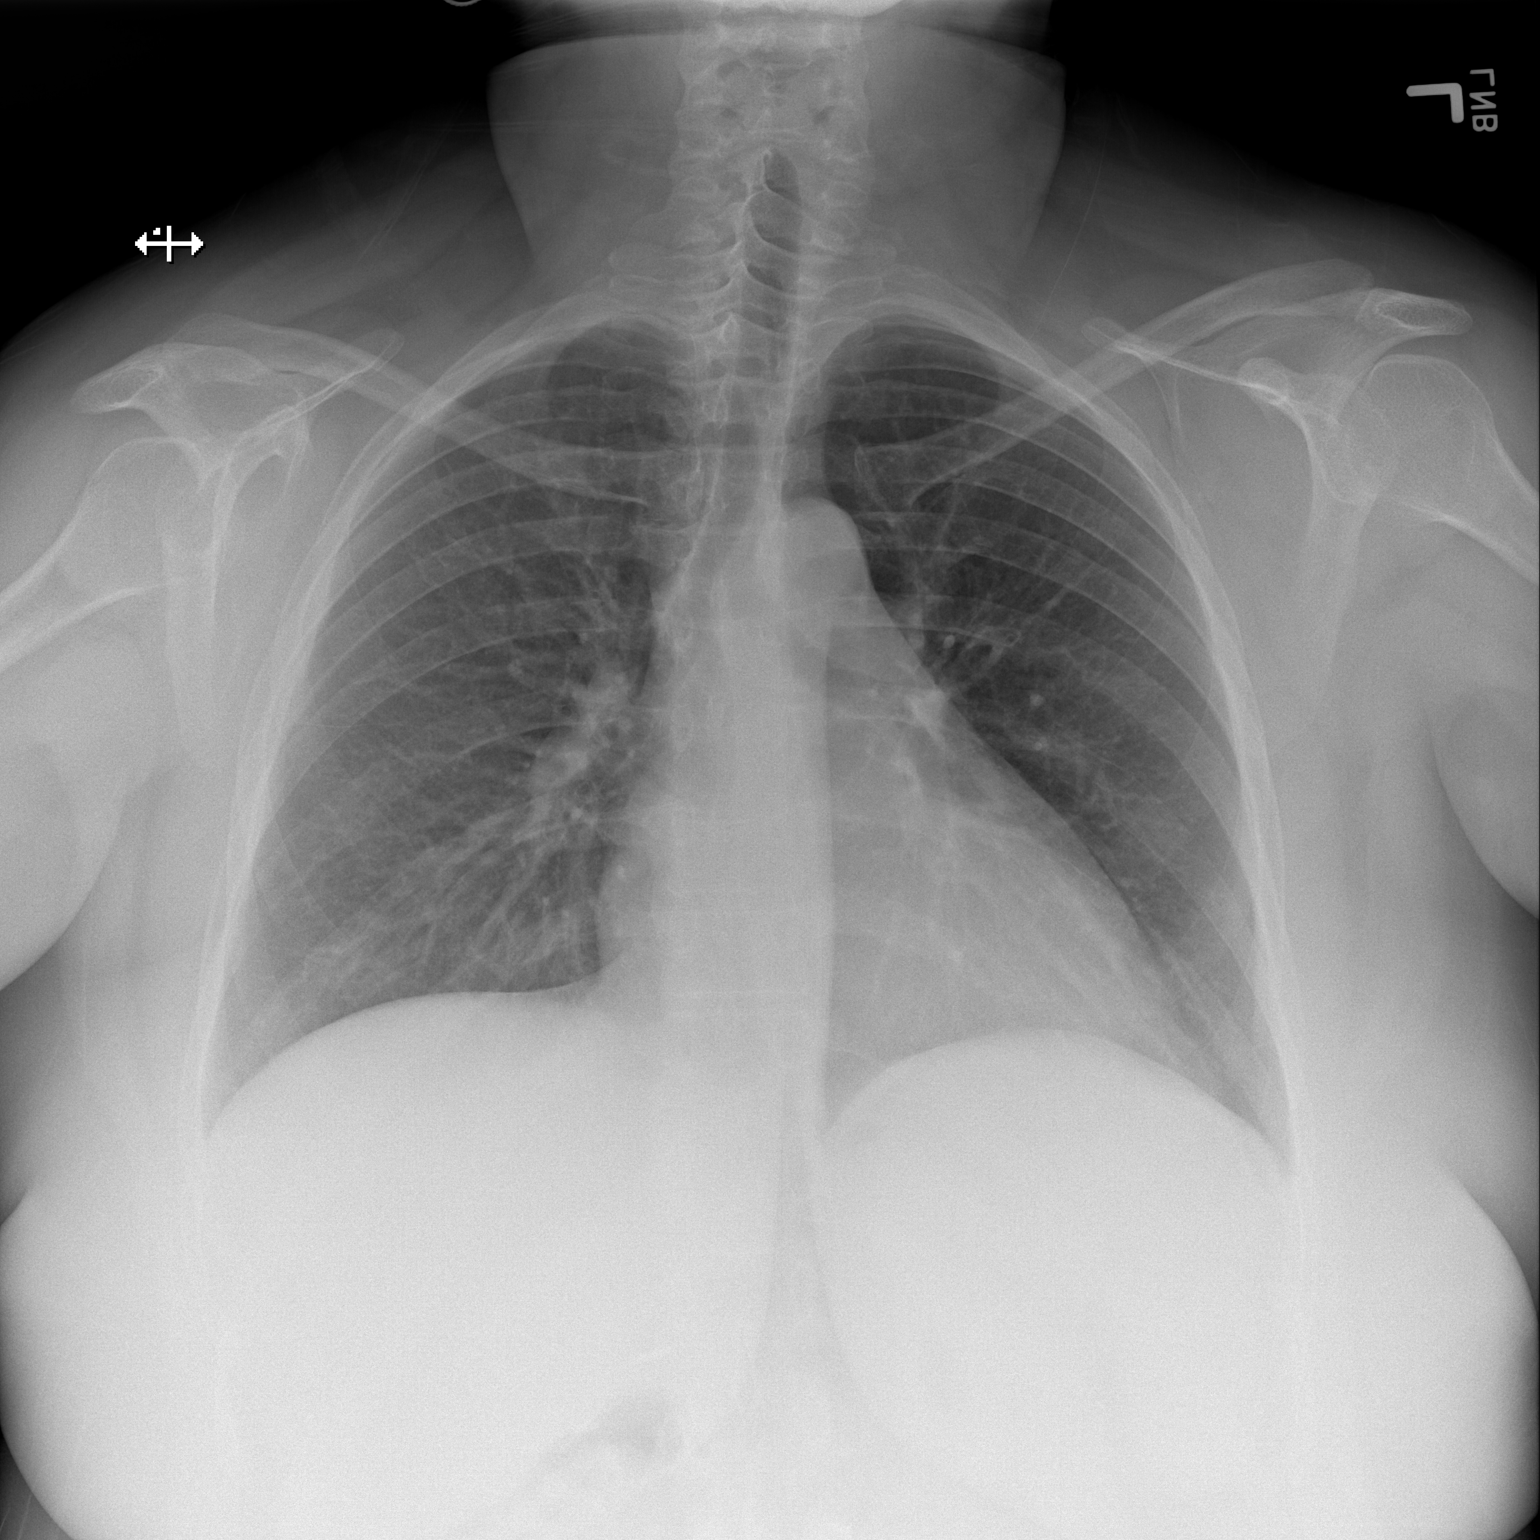
[im 2/2]
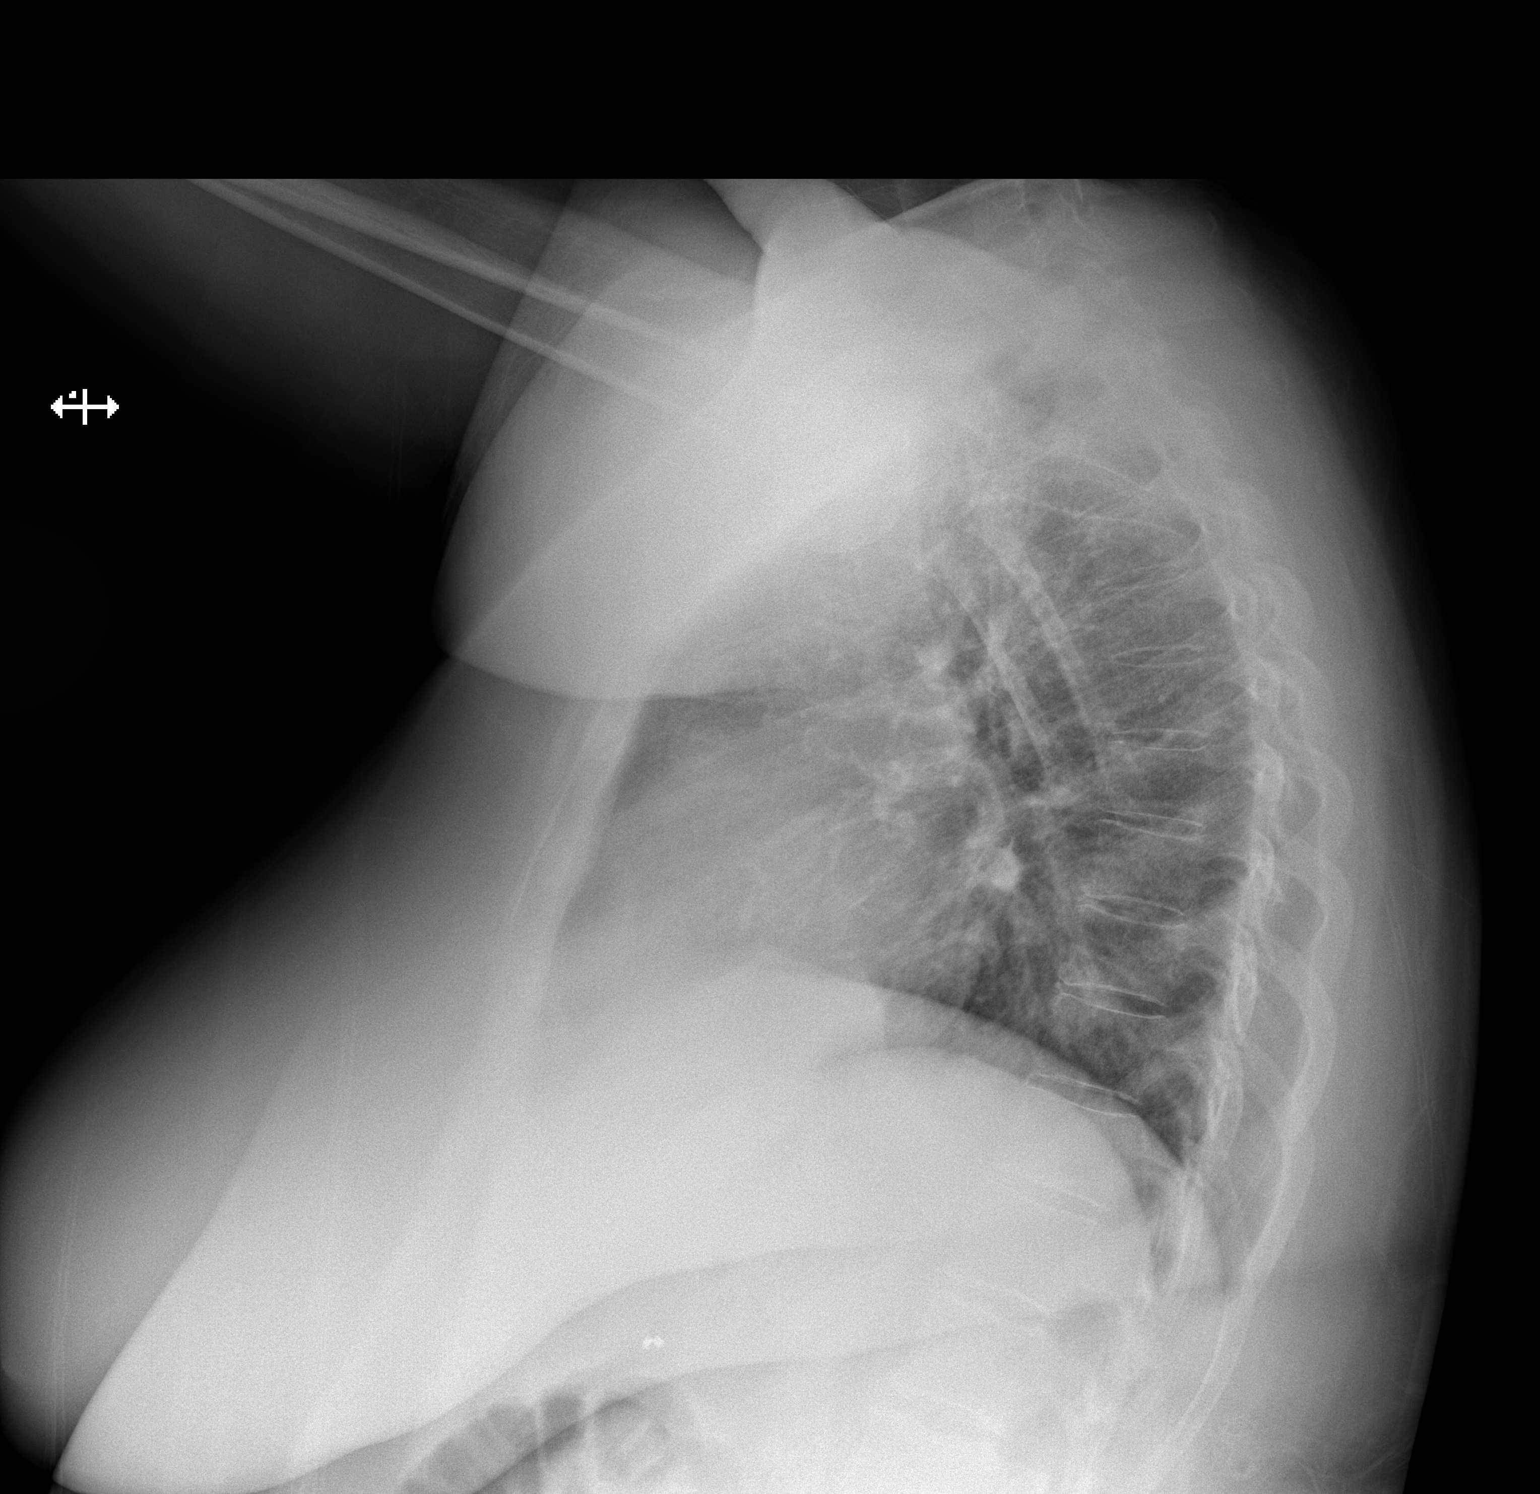

[2 of 2 positions shown; findings below may reference images not displayed]

PROCEDURE:     DXR - DXR CHEST PA (OR AP) AND LATERAL  - June 05, 2012  [DATE]

RESULT:     Comparison is made to the study 13 December, 2010.

The lungs are adequately inflated. The perihilar interstitial markings are
mildly prominent. The cardiac silhouette is top normal in size. The
pulmonary vascularity is not clearly engorged. There is no pleural effusion
or alveolar infiltrate. The mediastinum is normal in width.
IMPRESSION: There is minimal prominence of the perihilar lung markings
which may reflect mild subsegmental atelectasis. There is no focal pneumonia
nor evidence of alveolar edema.

[REDACTED]

## 2013-08-21 IMAGING — US US EXTREM LOW VENOUS*L*
1 series · 14 of 24 positions shown · non-contrast
Comparison: none

REASON FOR EXAM: calf pain/swelling, h/o DVT
COMMENTS:

[Series 1: us extrem low venous*left* · 0.09mm/px · 14 of 24 slices shown]
[im 1/24]
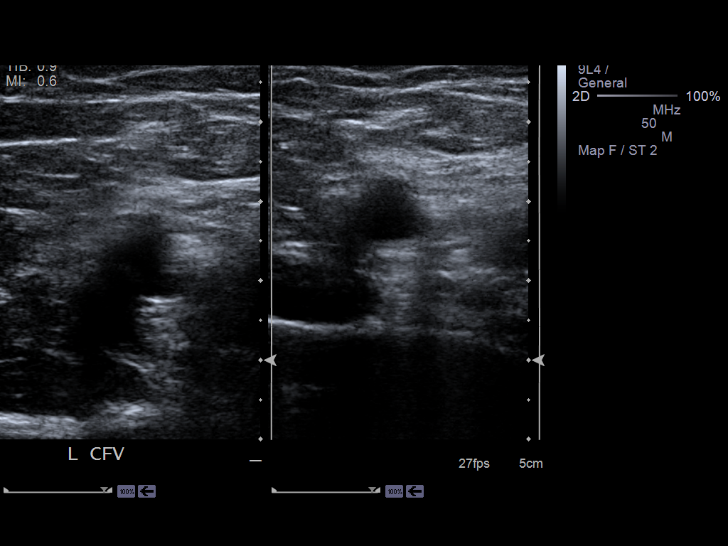
[im 3/24]
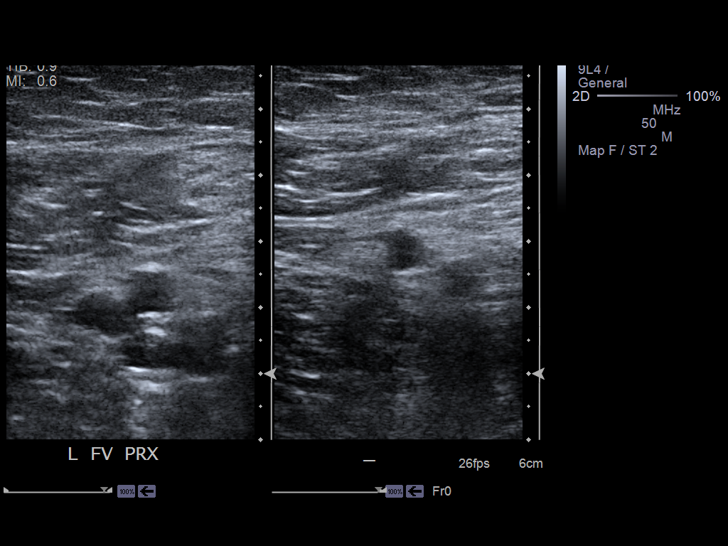
[im 5/24]
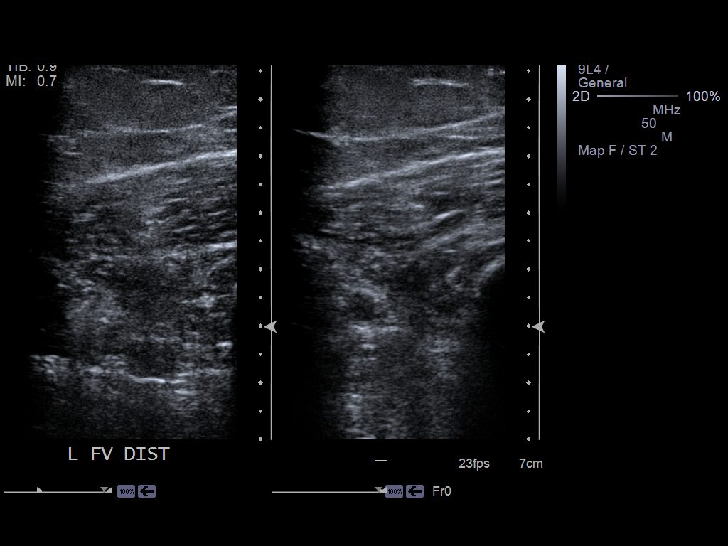
[im 7/24]
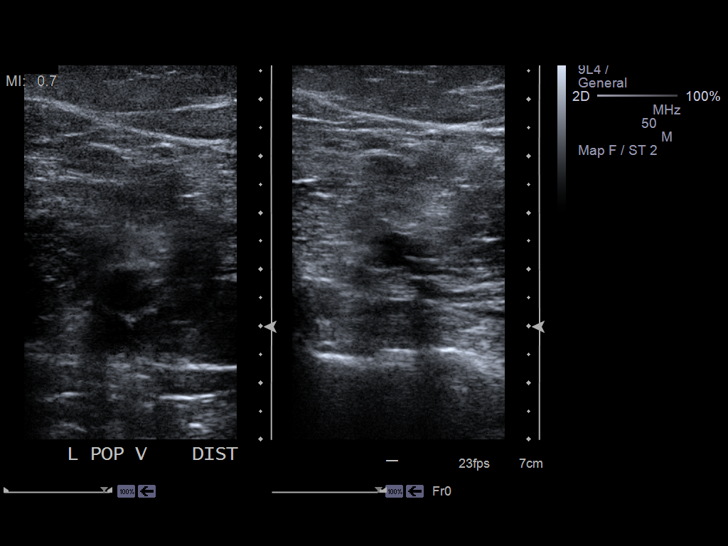
[im 8/24]
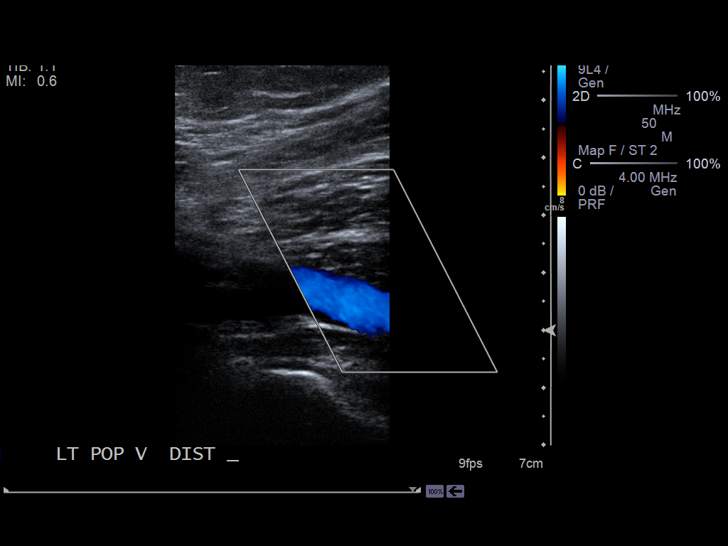
[im 10/24]
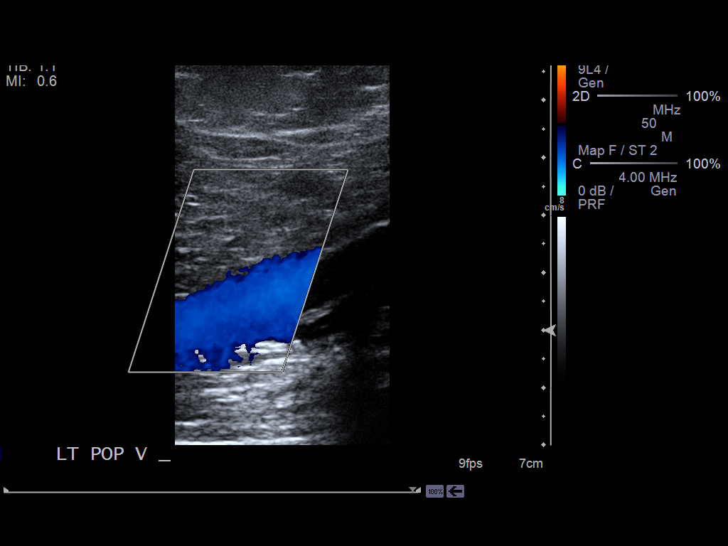
[im 12/24]
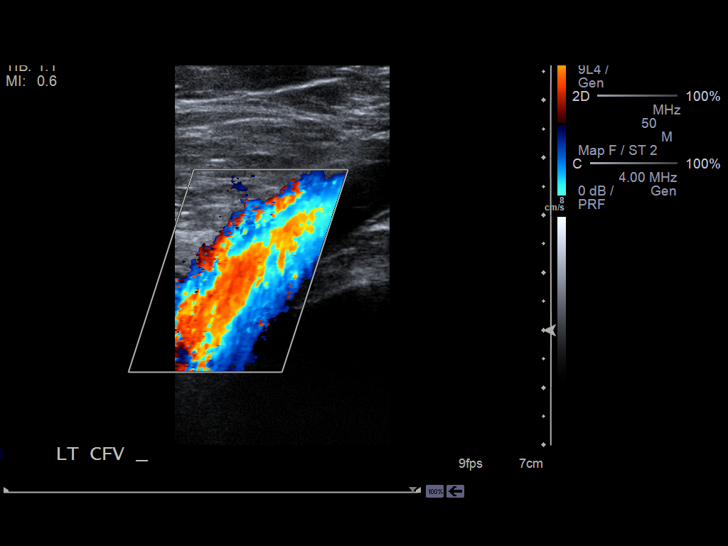
[im 13/24]
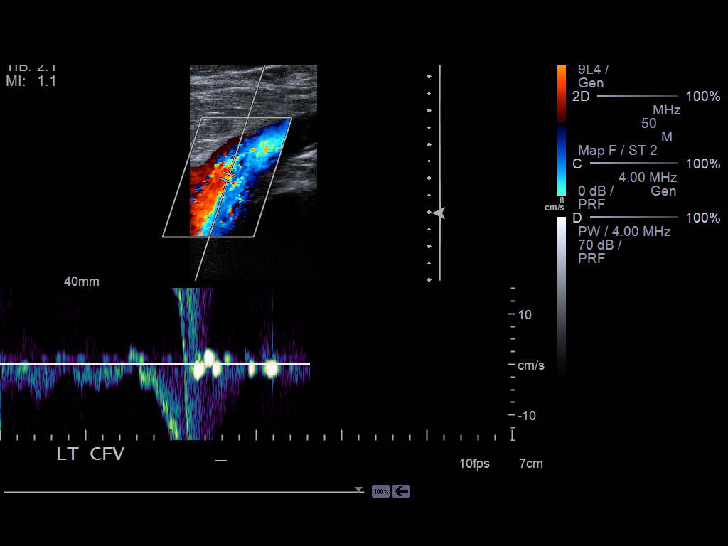
[im 15/24]
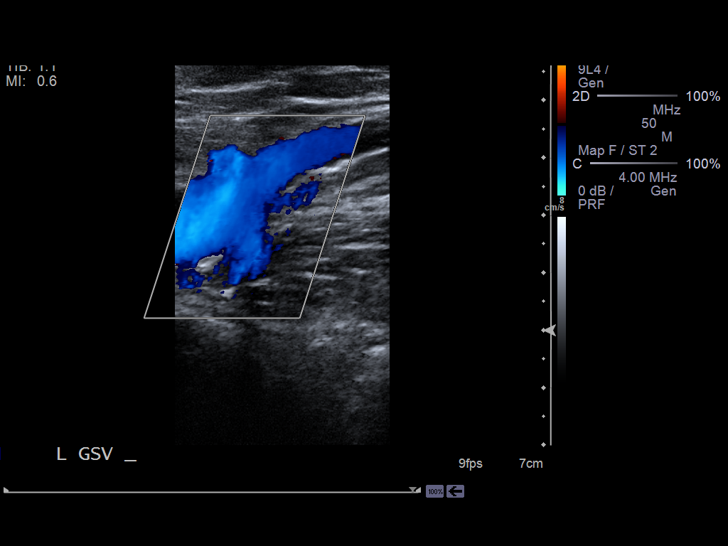
[im 17/24]
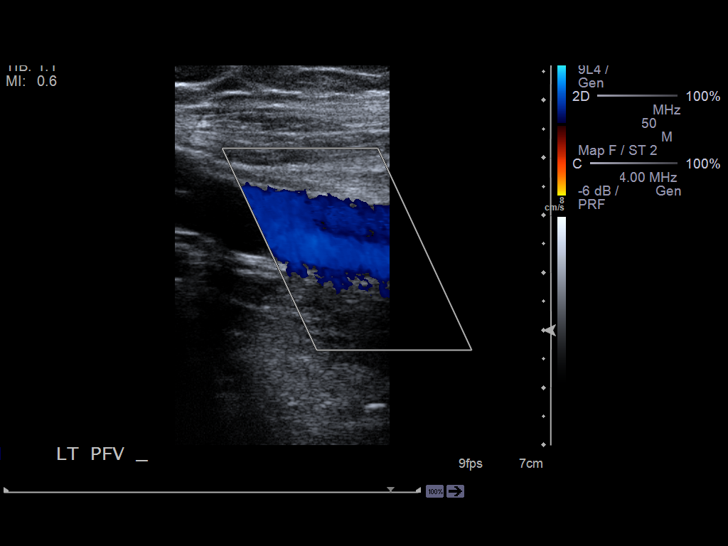
[im 19/24]
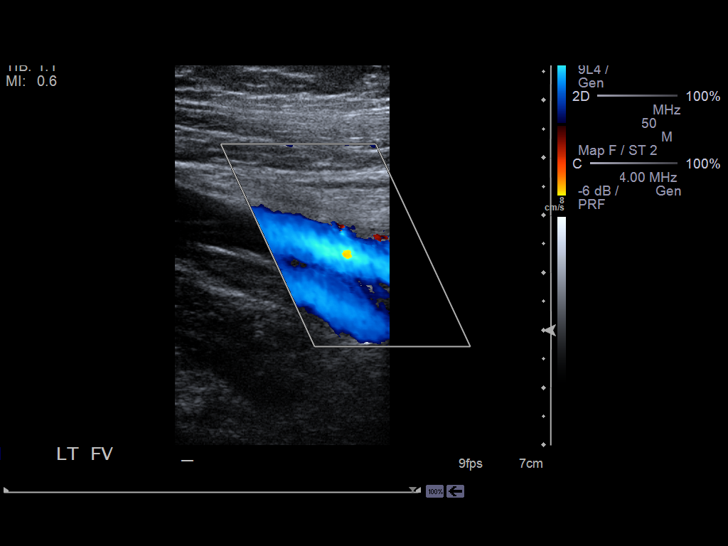
[im 20/24]
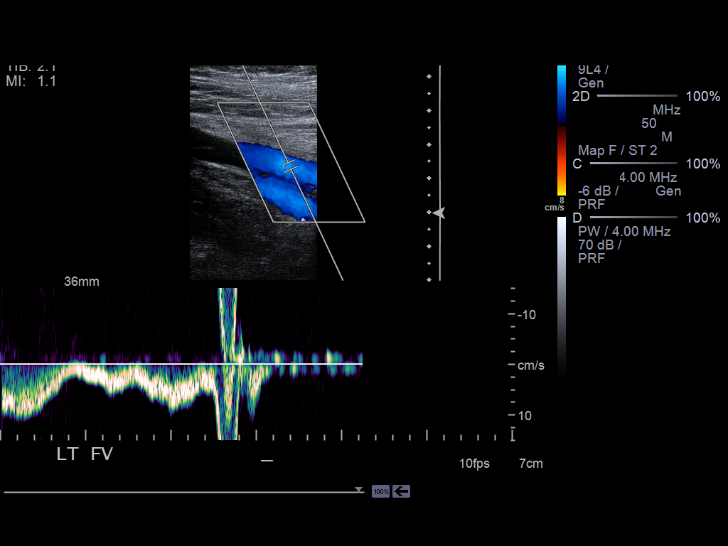
[im 22/24]
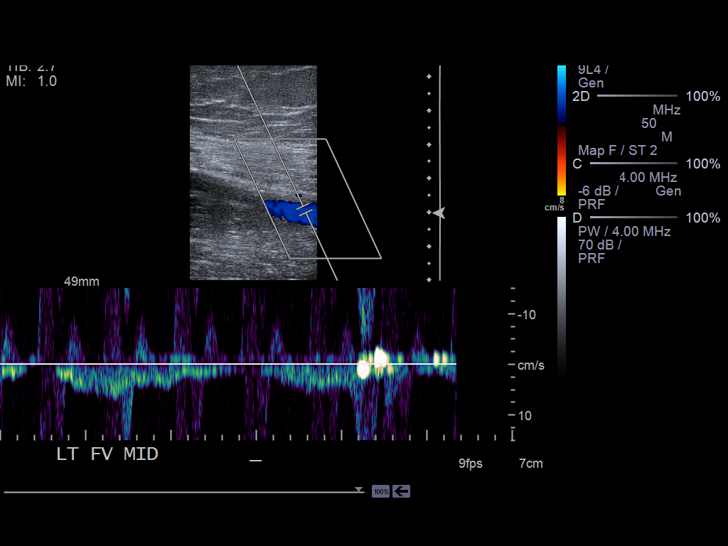
[im 24/24]
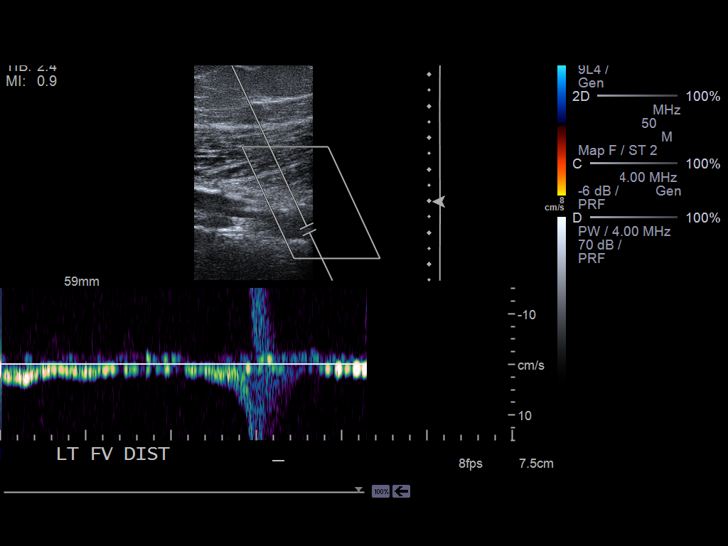

[14 of 24 positions shown; findings below may reference images not displayed]

PROCEDURE:     US  - US DOPPLER LOW EXTR LEFT  - June 05, 2012  [DATE]

RESULT:     Duplex Doppler sonography was performed of the deep veins of the
left lower extremity.

The left femoral and popliteal veins are normally compressible. The waveform
patterns are normal and the color flow images are normal. The response to
the augmentation and Valsalva maneuvers is normal.
IMPRESSION: There is no evidence of thrombus within the left femoral or
popliteal veins.

[REDACTED]

## 2013-08-21 IMAGING — CR DG KNEE COMPLETE 4+V*L*
1 series · 4 of 4 positions shown · non-contrast
Comparison: none

REASON FOR EXAM: knee pain
COMMENTS:   LMP: Post Hysterectomy

[Series 1: t knee ap left · 0.14mm/px · 4 of 4 slices shown]
[im 1/4]
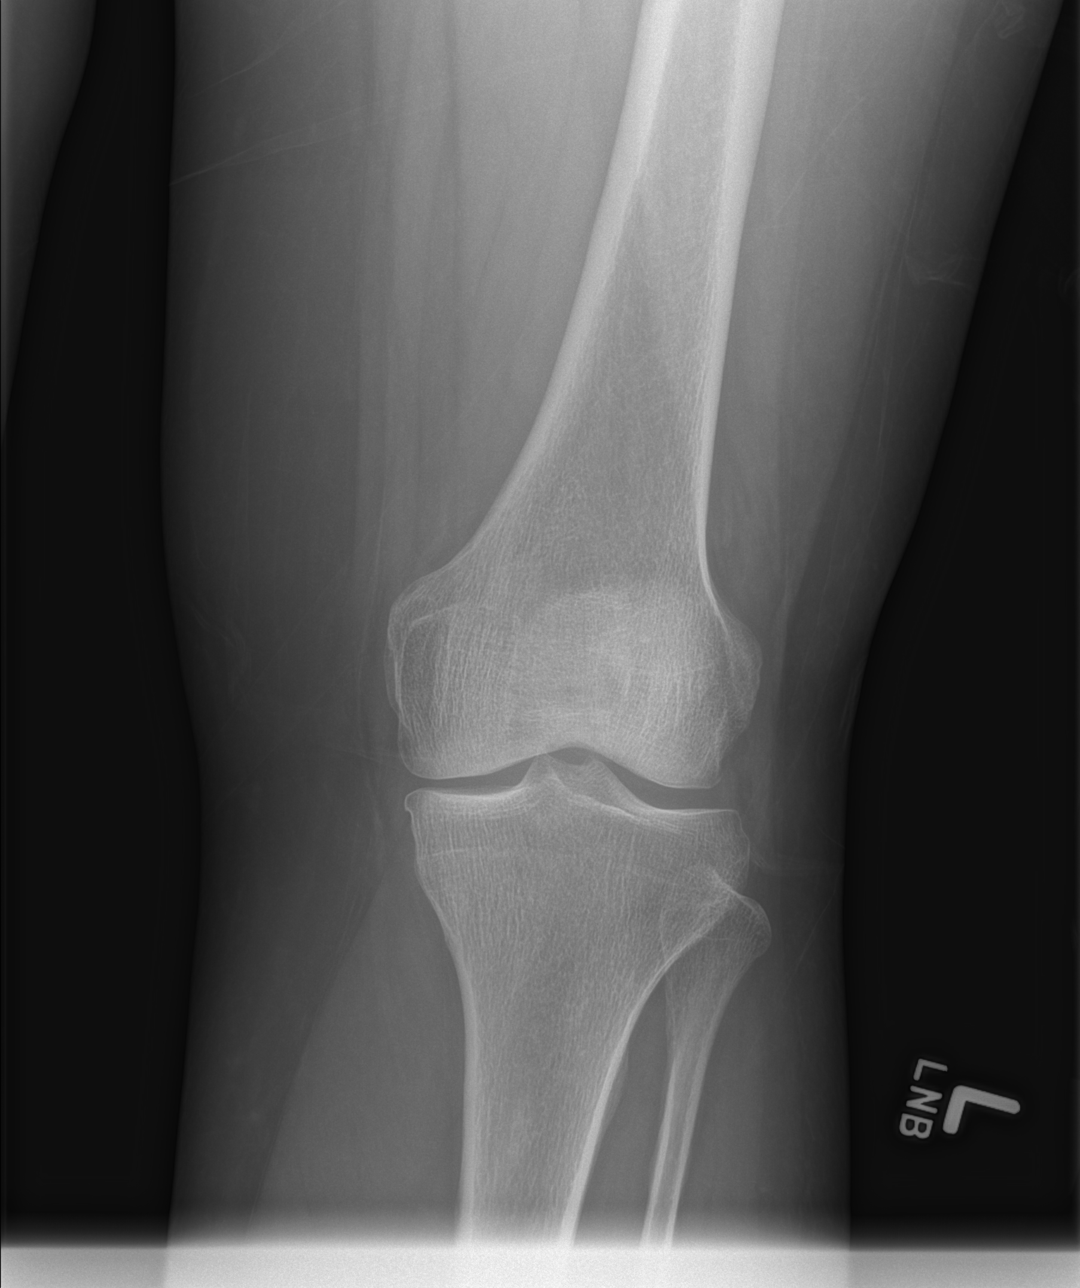
[im 2/4]
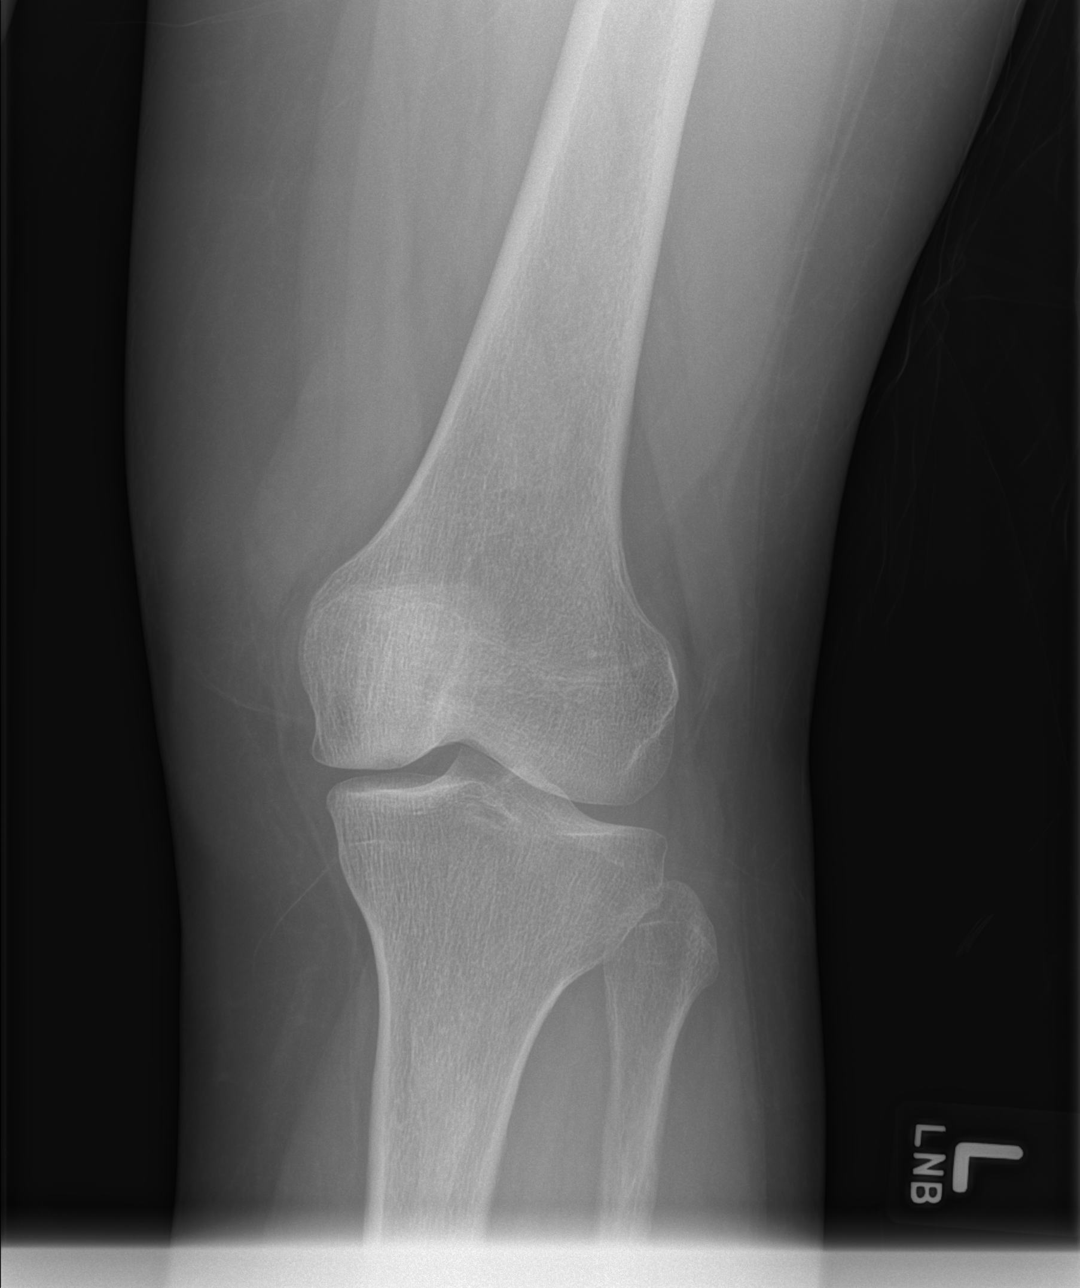
[im 3/4]
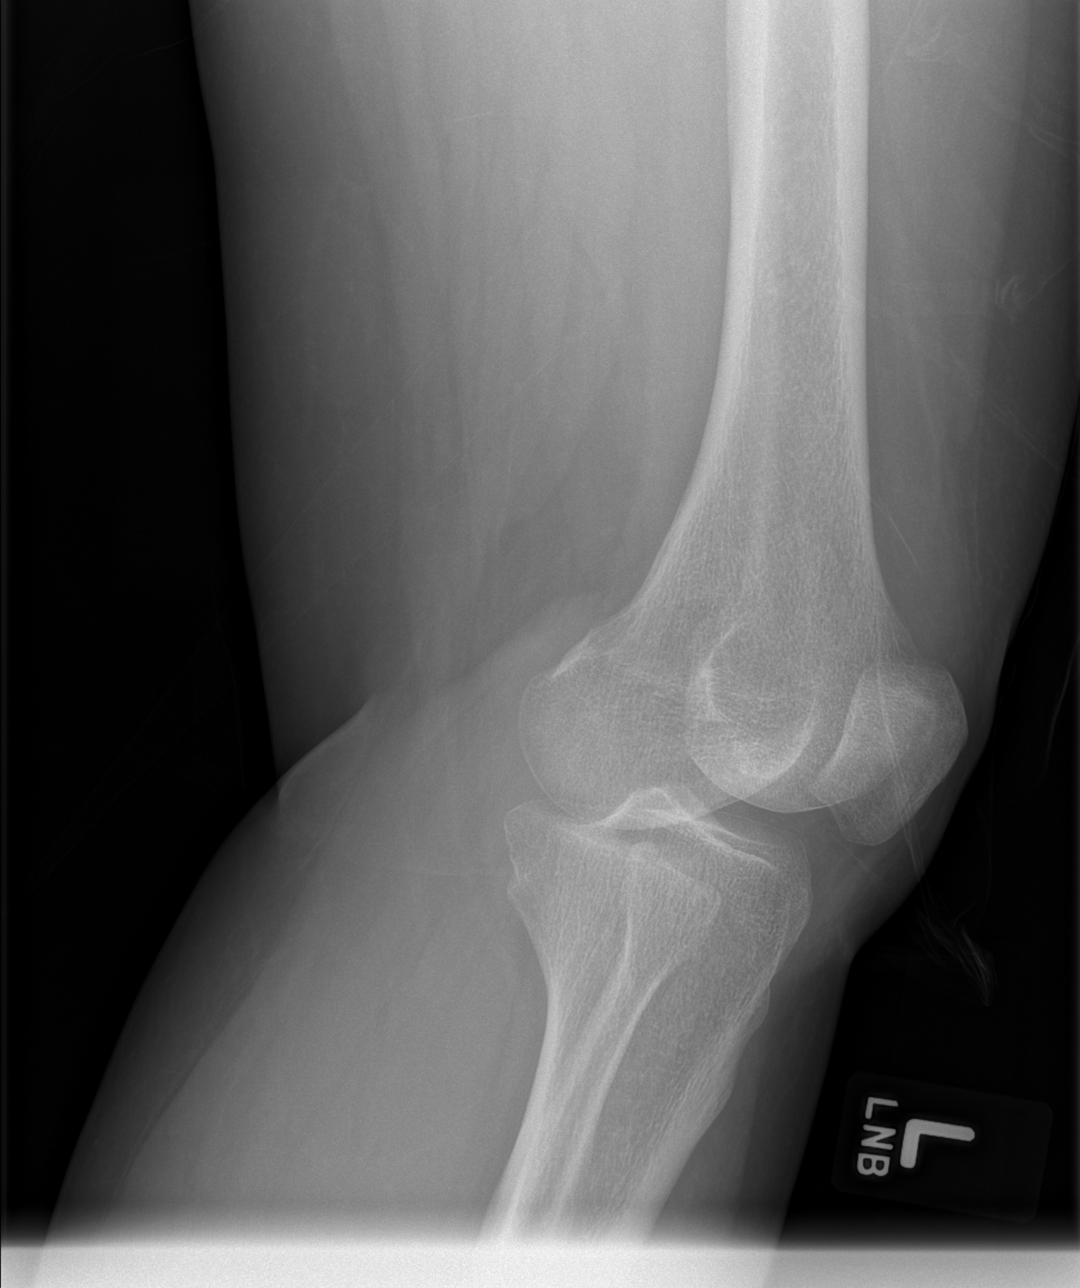
[im 4/4]
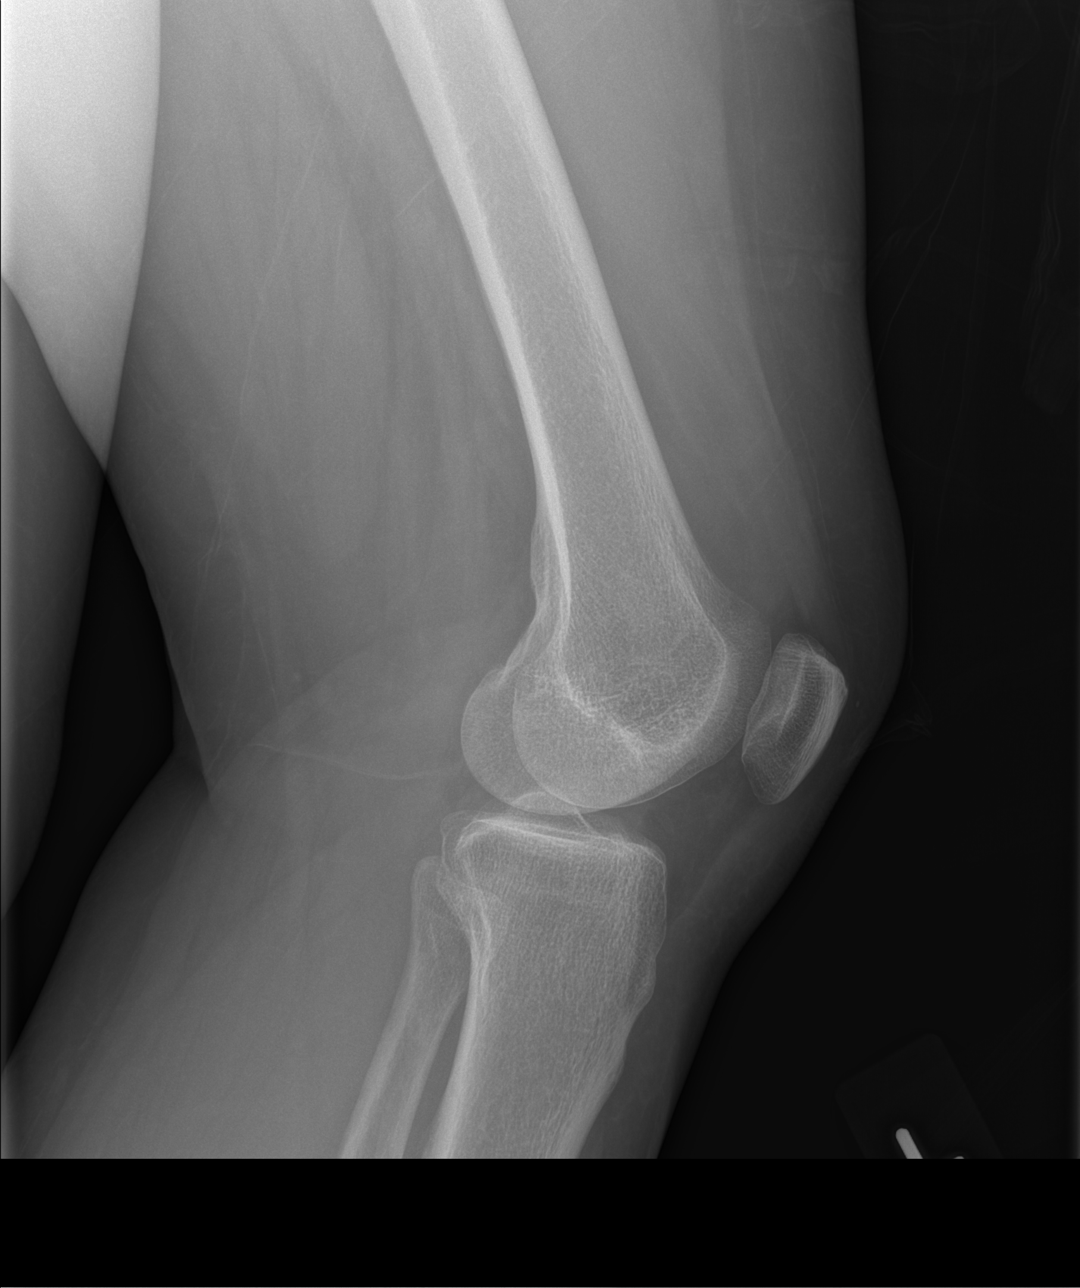

[4 of 4 positions shown; findings below may reference images not displayed]

PROCEDURE:     DXR - DXR KNEE LT COMP WITH OBLIQUES  - June 05, 2012  [DATE]

RESULT:     Four views of the left knee reveal the bones to be adequately
mineralized. There is no evidence of a fracture or dislocation. There is no
periosteal reaction. No more than minimal narrowing of the medial joint
compartment is noted. There is mild beaking of the tibial spines. The
overlying soft tissues are normal in appearance. There is no definite
evidence of a joint effusion.
IMPRESSION: There is no acute bony abnormality of the left knee. If the
patient's symptoms warrant further evaluation, MRI would be a useful next
step.

[REDACTED]

## 2013-09-29 IMAGING — CR DG CHEST 2V
1 series · 2 of 2 positions shown · non-contrast
Comparison: none

REASON FOR EXAM: chest pain cough
COMMENTS:   LMP: Post Hysterectomy

PROCEDURE:     DXR - DXR CHEST PA (OR AP) AND LATERAL  - July 14, 2012  [DATE]
RESULT:     Comparison: None

[Series 1: pa · 0.17mm/px · 2 of 2 slices shown]
[im 1/2]
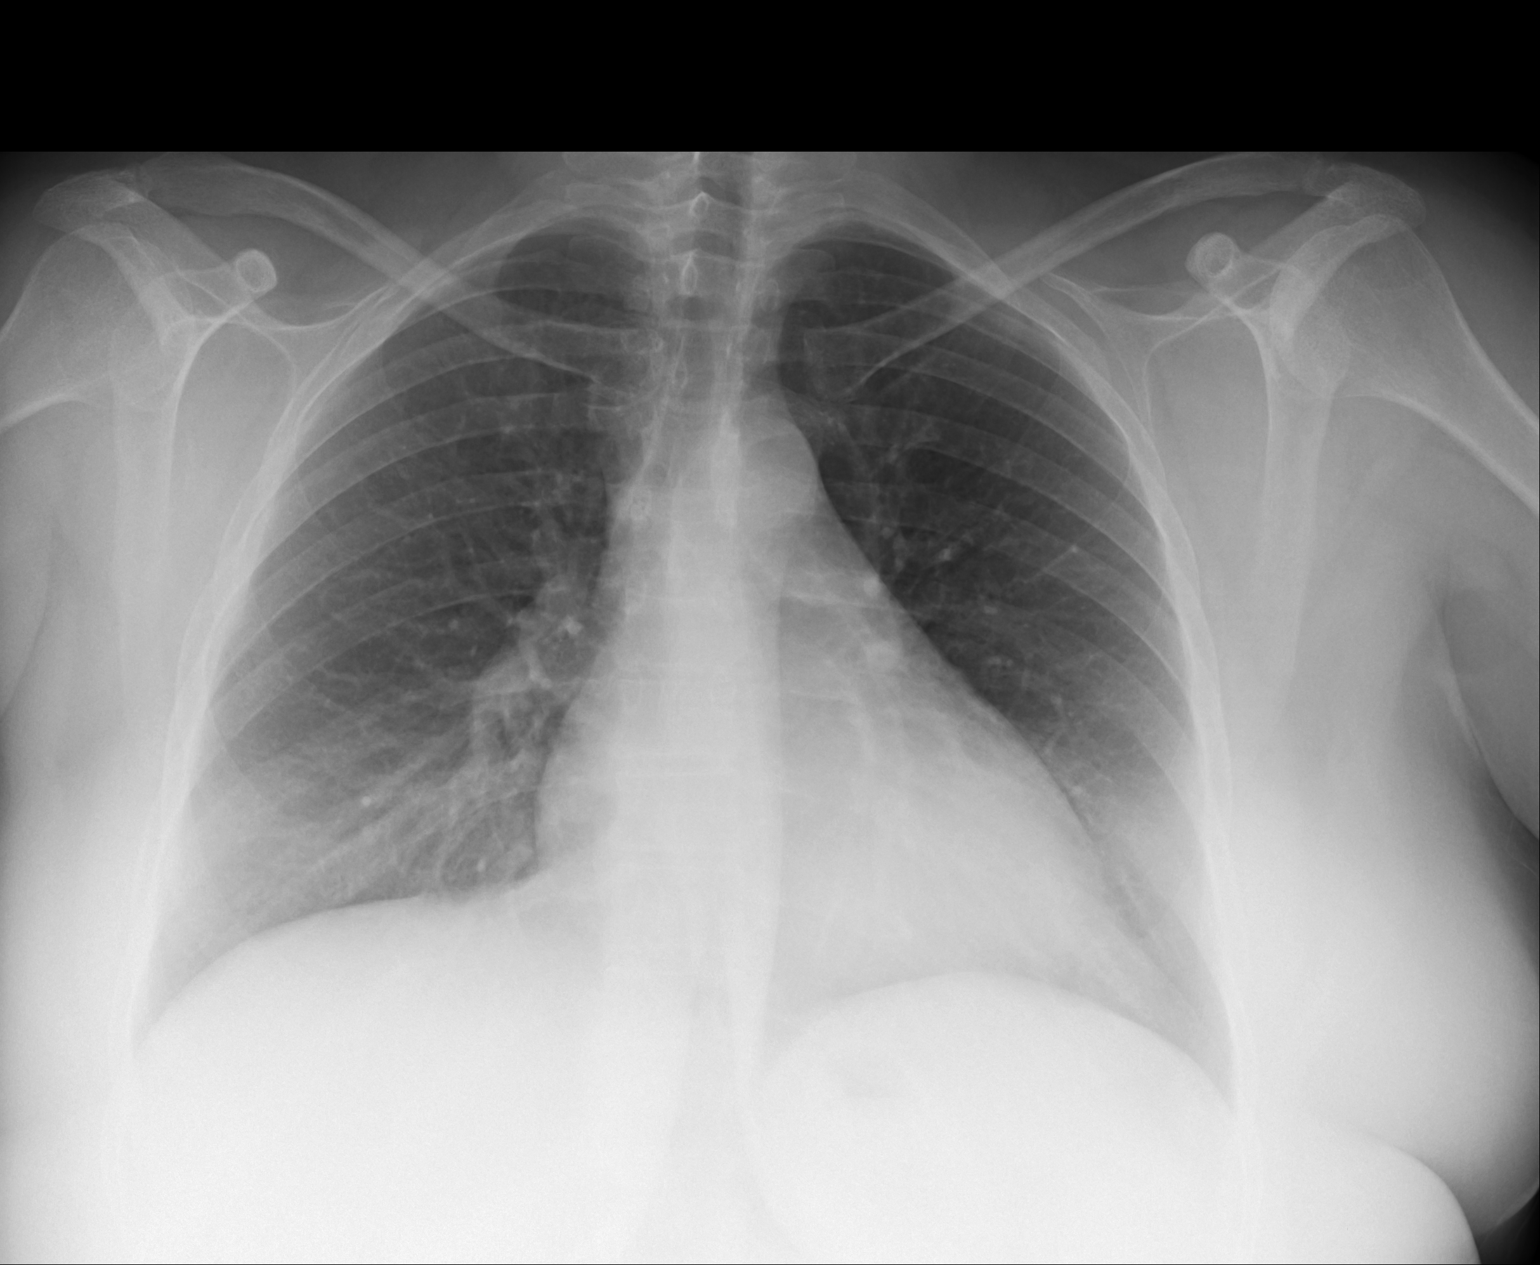
[im 2/2]
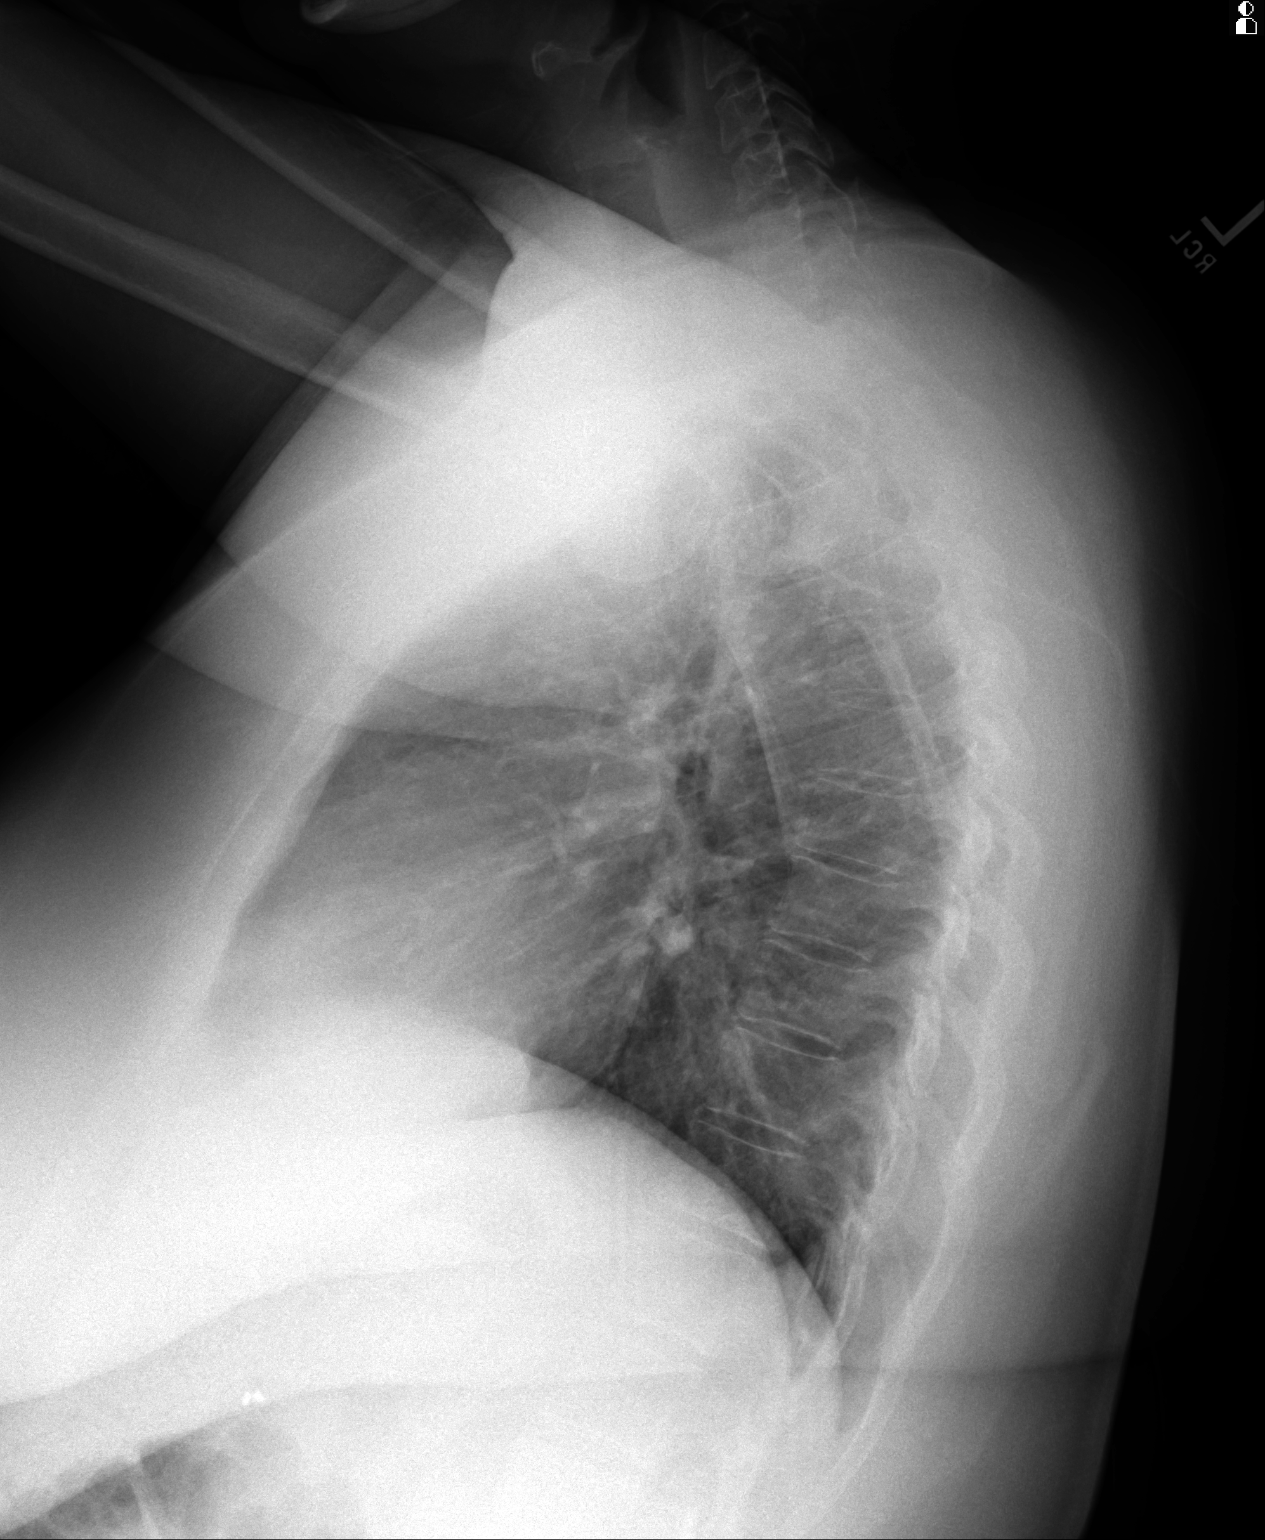

[2 of 2 positions shown; findings below may reference images not displayed]

FINDINGS: PA and lateral chest radiographs are provided.  There is no focal
parenchymal opacity, pleural effusion, or pneumothorax. The heart and
mediastinum are unremarkable.  The osseous structures are unremarkable.
IMPRESSION: No acute disease of the che[REDACTED]

## 2013-10-01 ENCOUNTER — Ambulatory Visit: Payer: Managed Care, Other (non HMO) | Admitting: General Surgery

## 2013-10-12 ENCOUNTER — Emergency Department: Payer: Self-pay | Admitting: Emergency Medicine

## 2013-10-12 LAB — CBC
HCT: 38 % (ref 35.0–47.0)
HGB: 12.3 g/dL (ref 12.0–16.0)
MCH: 25.2 pg — ABNORMAL LOW (ref 26.0–34.0)
MCHC: 32.3 g/dL (ref 32.0–36.0)
MCV: 78 fL — ABNORMAL LOW (ref 80–100)
Platelet: 265 10*3/uL (ref 150–440)
RBC: 4.87 10*6/uL (ref 3.80–5.20)
RDW: 16.6 % — ABNORMAL HIGH (ref 11.5–14.5)
WBC: 8.6 10*3/uL (ref 3.6–11.0)

## 2013-10-12 LAB — BASIC METABOLIC PANEL
Anion Gap: 7 (ref 7–16)
BUN: 11 mg/dL (ref 7–18)
CALCIUM: 9.2 mg/dL (ref 8.5–10.1)
CHLORIDE: 105 mmol/L (ref 98–107)
CREATININE: 0.87 mg/dL (ref 0.60–1.30)
Co2: 29 mmol/L (ref 21–32)
EGFR (African American): >60
GLUCOSE: 99 mg/dL (ref 65–99)
OSMOLALITY: 281 (ref 275–301)
Potassium: 3 mmol/L — ABNORMAL LOW (ref 3.5–5.1)
SODIUM: 141 mmol/L (ref 136–145)

## 2013-10-13 LAB — TROPONIN I: Troponin-I: <0.02 ng/mL

## 2013-10-13 LAB — PRO B NATRIURETIC PEPTIDE: B-TYPE NATIURETIC PEPTID: 66 pg/mL (ref 0–125)

## 2013-10-17 ENCOUNTER — Encounter: Payer: Self-pay | Admitting: *Deleted

## 2013-10-19 IMAGING — CR DG KNEE COMPLETE 4+V*L*
1 series · 4 of 4 positions shown · non-contrast
Comparison: none

REASON FOR EXAM: painful and swollen
COMMENTS:

PROCEDURE:     DXR - DXR KNEE LT COMP WITH OBLIQUES  - August 03, 2012 [DATE]
RESULT:     Four views of the left knee reveal the bones to be adequately
mineralized. There is no evidence of an acute fracture. There is mild
beaking of the tibial spines. There is no evidence of joint effusion.

[Series 1: t knee ap left · 0.14mm/px · 4 of 4 slices shown]
[im 1/4]
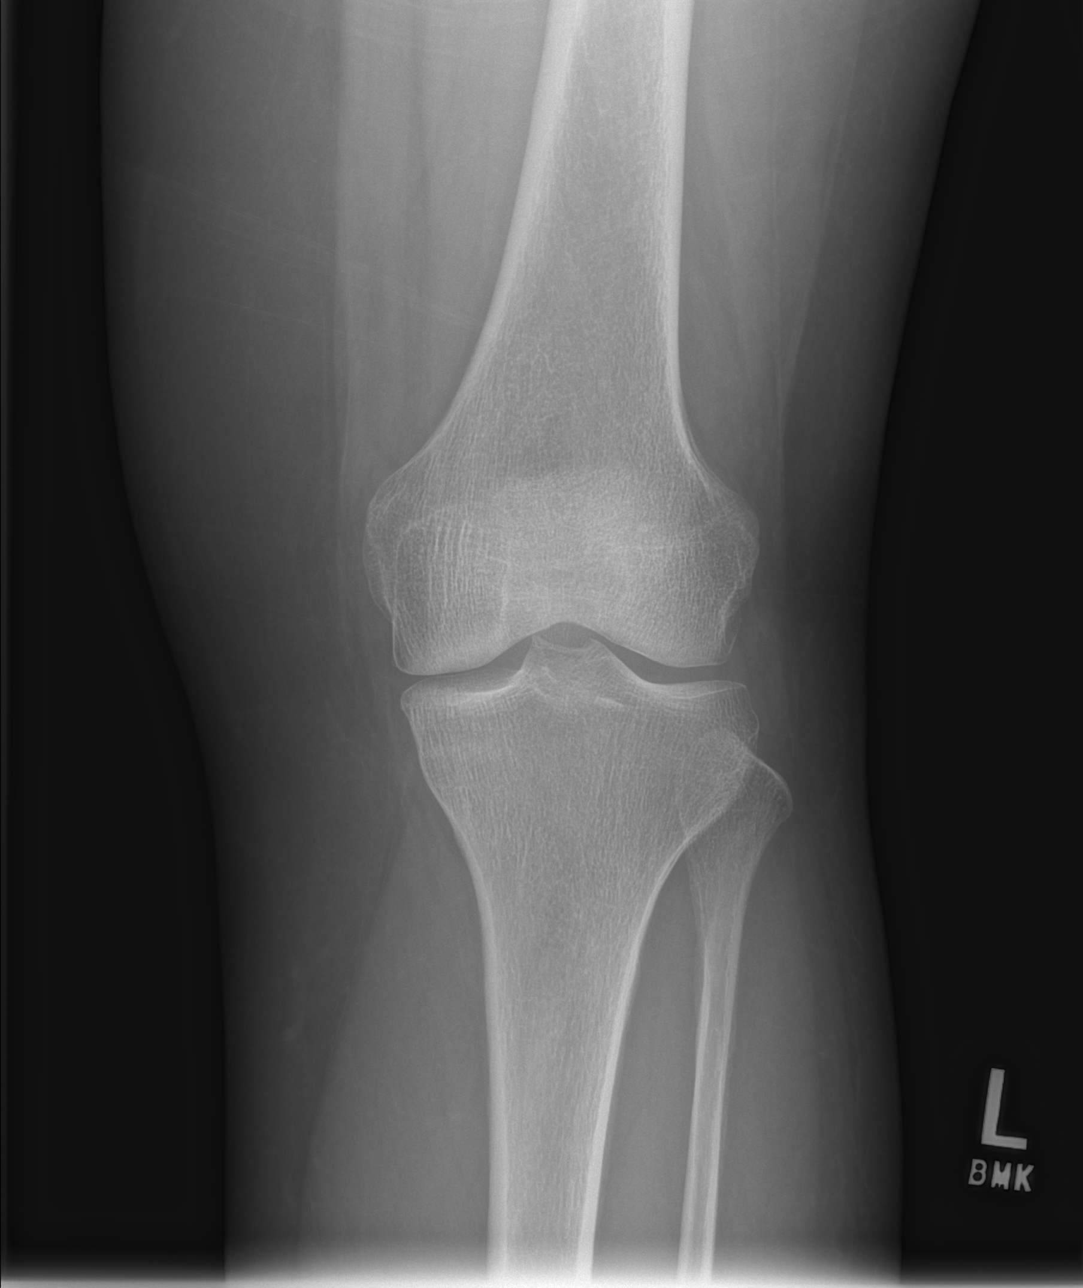
[im 2/4]
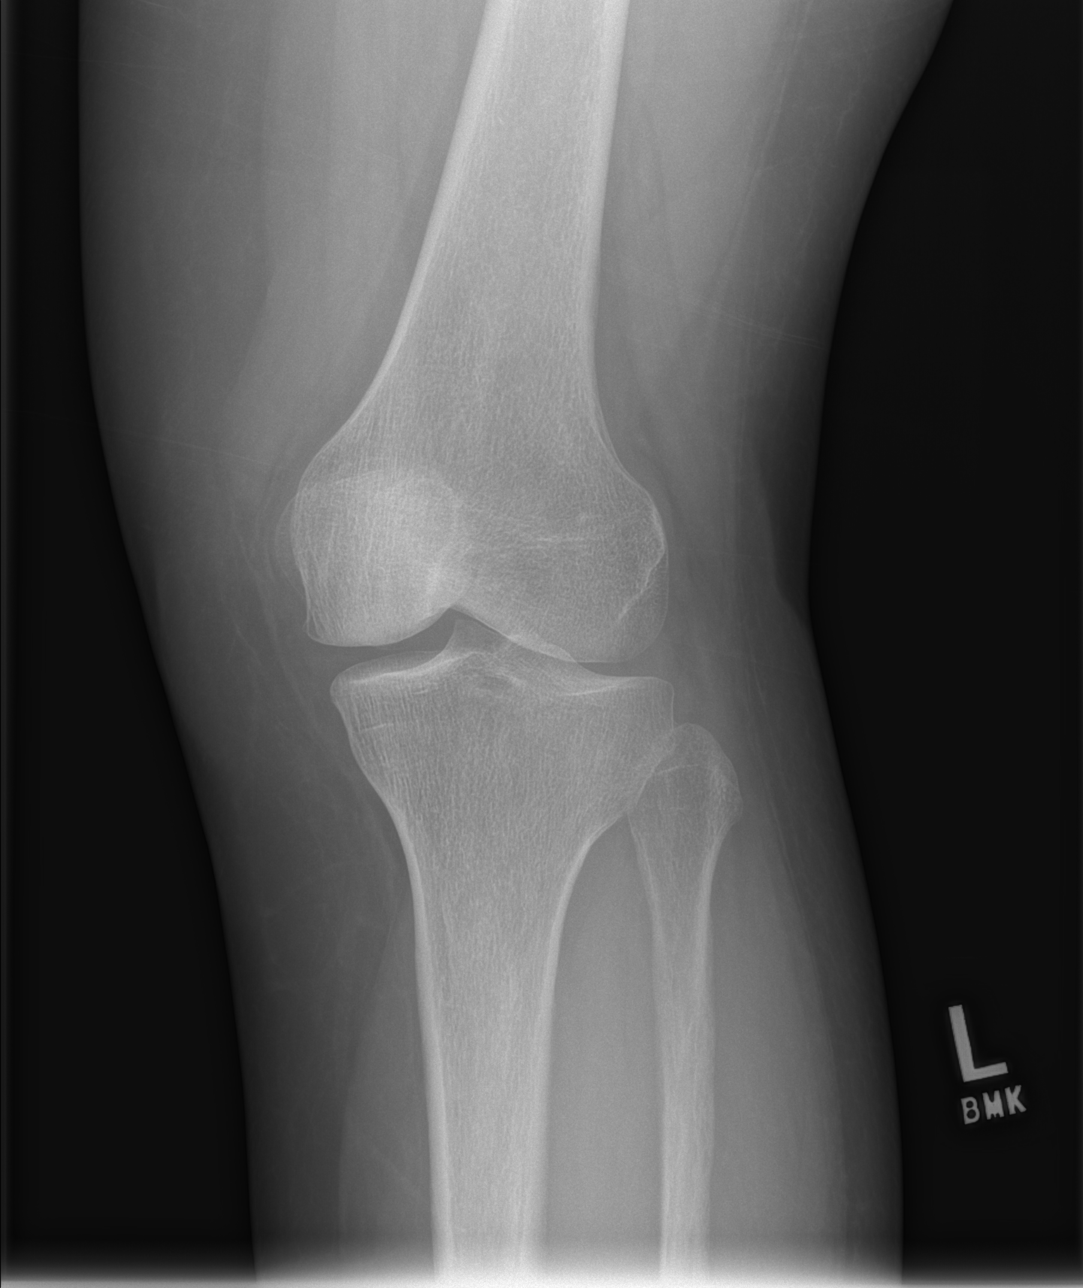
[im 3/4]
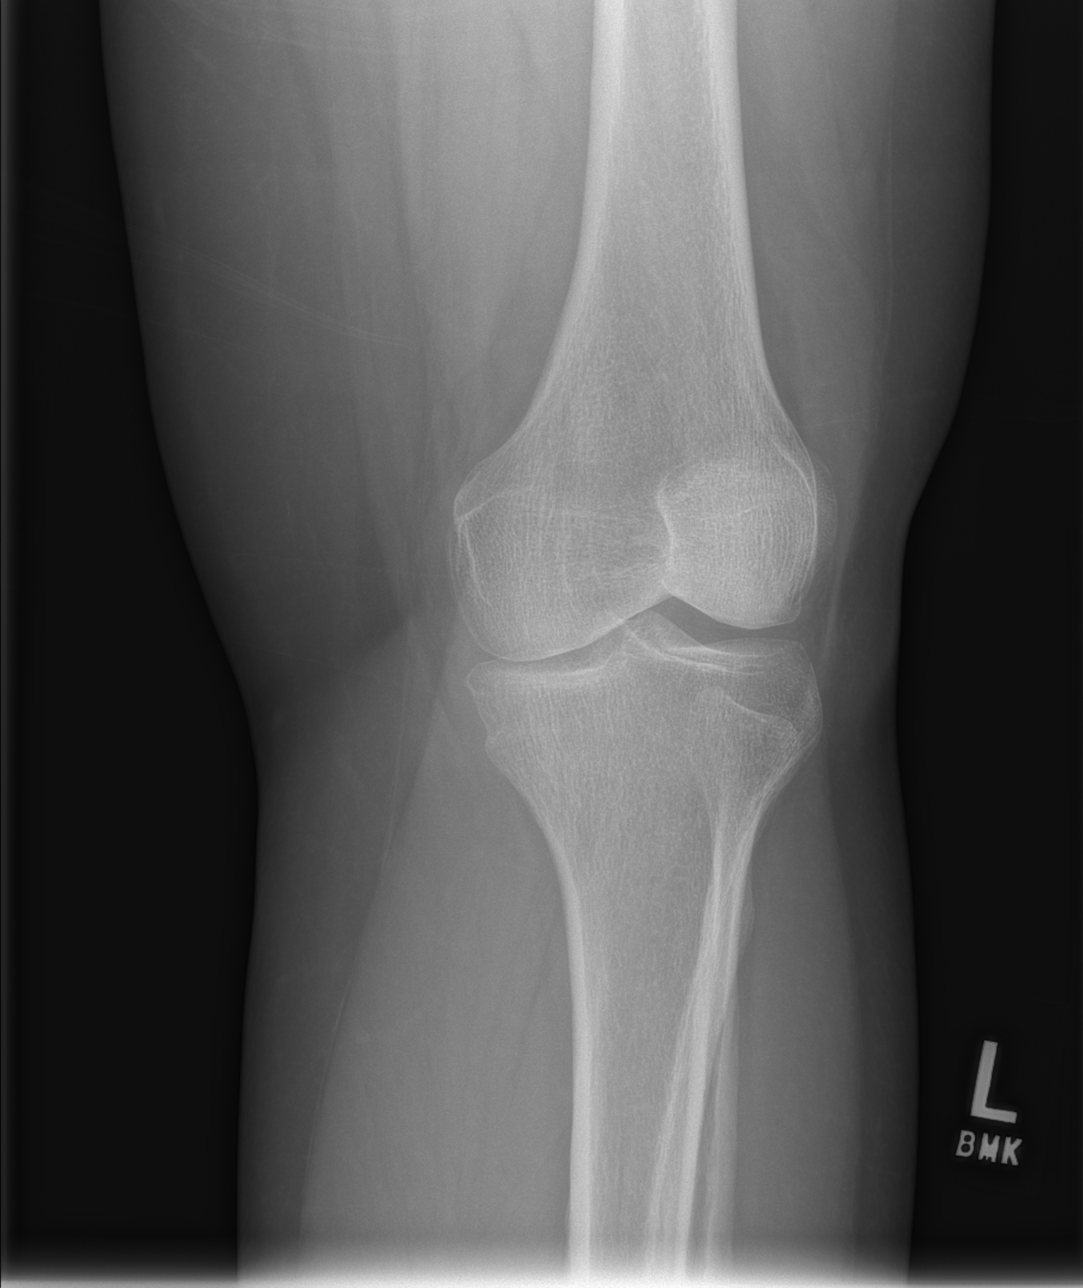
[im 4/4]
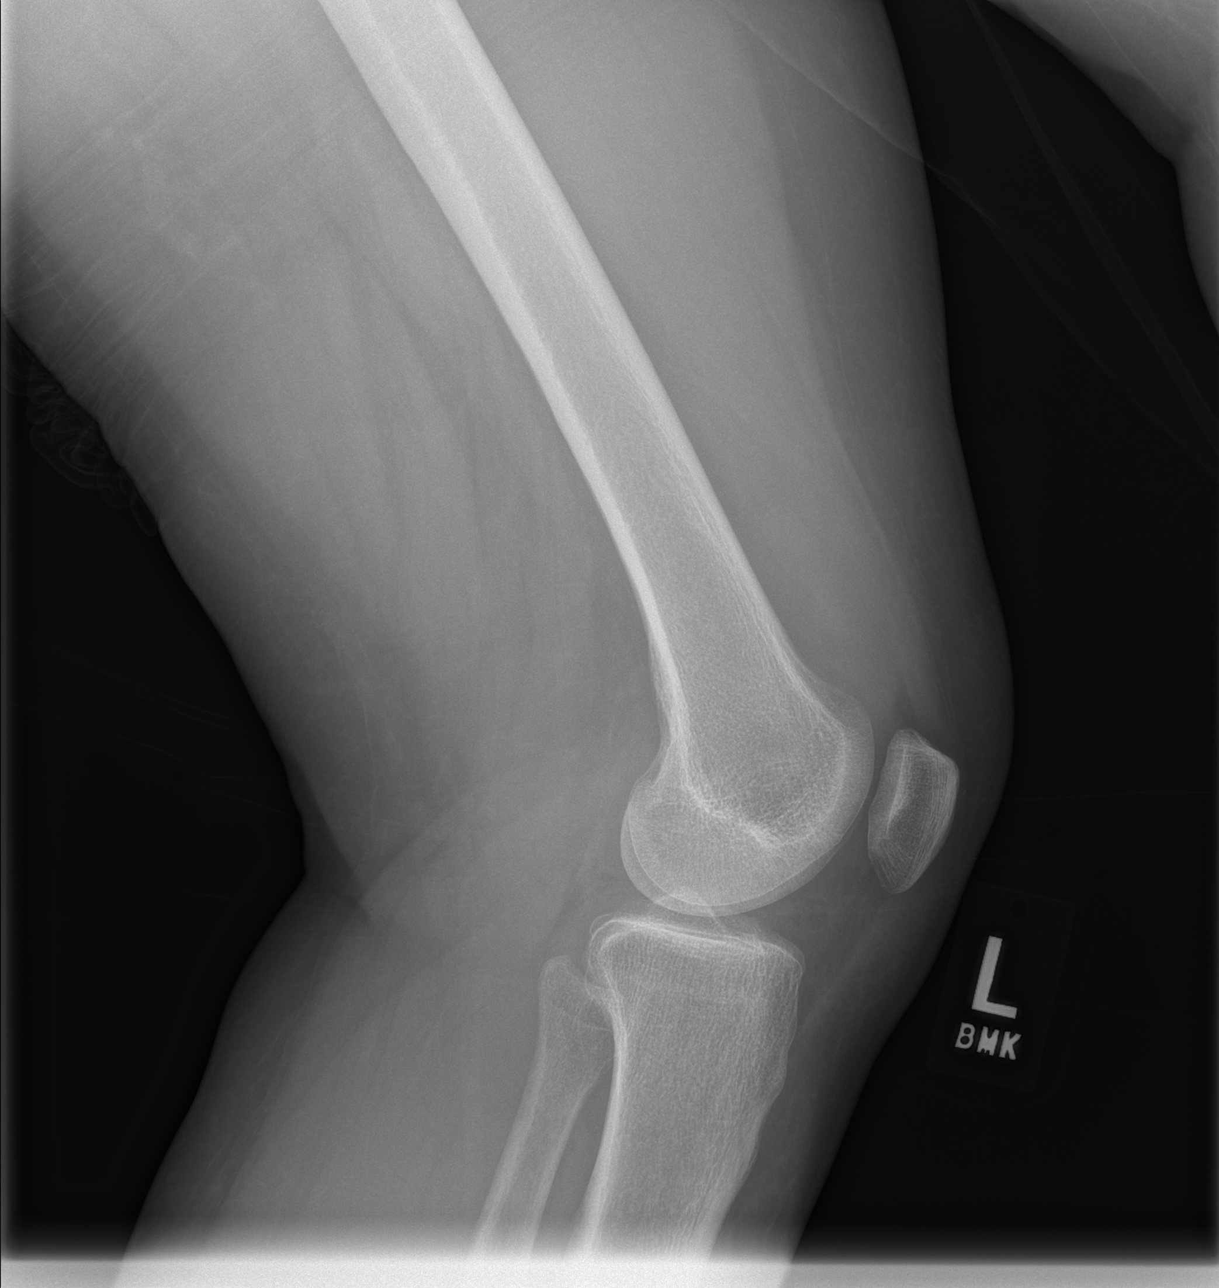

[4 of 4 positions shown; findings below may reference images not displayed]

IMPRESSION: There is no acute bony abnormality of the left knee. Very
mild degenerative changes are present.

[REDACTED]

## 2013-11-06 LAB — CBC
HCT: 33.9 % — ABNORMAL LOW (ref 35.0–47.0)
HGB: 11.1 g/dL — ABNORMAL LOW (ref 12.0–16.0)
MCH: 25.8 pg — ABNORMAL LOW (ref 26.0–34.0)
MCHC: 32.7 g/dL (ref 32.0–36.0)
MCV: 79 fL — ABNORMAL LOW (ref 80–100)
Platelet: 247 10*3/uL (ref 150–440)
RBC: 4.3 10*6/uL (ref 3.80–5.20)
RDW: 16.9 % — ABNORMAL HIGH (ref 11.5–14.5)
WBC: 8.3 10*3/uL (ref 3.6–11.0)

## 2013-11-06 LAB — BASIC METABOLIC PANEL
Anion Gap: 4 — ABNORMAL LOW (ref 7–16)
BUN: 14 mg/dL (ref 7–18)
CALCIUM: 8.7 mg/dL (ref 8.5–10.1)
CHLORIDE: 106 mmol/L (ref 98–107)
Co2: 29 mmol/L (ref 21–32)
Creatinine: 0.97 mg/dL (ref 0.60–1.30)
EGFR (African American): >60
EGFR (Non-African Amer.): >60
Glucose: 148 mg/dL — ABNORMAL HIGH (ref 65–99)
OSMOLALITY: 281 (ref 275–301)
POTASSIUM: 3.3 mmol/L — AB (ref 3.5–5.1)
SODIUM: 139 mmol/L (ref 136–145)

## 2013-11-06 LAB — PRO B NATRIURETIC PEPTIDE: B-Type Natriuretic Peptide: 37 pg/mL (ref 0–125)

## 2013-11-06 LAB — TSH: THYROID STIMULATING HORM: 2.28 u[IU]/mL

## 2013-11-06 LAB — TROPONIN I

## 2013-11-07 ENCOUNTER — Observation Stay: Payer: Self-pay | Admitting: Internal Medicine

## 2013-11-07 LAB — BASIC METABOLIC PANEL
Anion Gap: 4 — ABNORMAL LOW (ref 7–16)
BUN: 13 mg/dL (ref 7–18)
CHLORIDE: 110 mmol/L — AB (ref 98–107)
CREATININE: 0.79 mg/dL (ref 0.60–1.30)
Calcium, Total: 8.2 mg/dL — ABNORMAL LOW (ref 8.5–10.1)
Co2: 29 mmol/L (ref 21–32)
EGFR (African American): >60
Glucose: 88 mg/dL (ref 65–99)
OSMOLALITY: 285 (ref 275–301)
Potassium: 3.5 mmol/L (ref 3.5–5.1)
Sodium: 143 mmol/L (ref 136–145)

## 2013-11-07 LAB — CBC WITH DIFFERENTIAL/PLATELET
BASOS ABS: 0 10*3/uL (ref 0.0–0.1)
Basophil %: 0.6 %
EOS ABS: 0.1 10*3/uL (ref 0.0–0.7)
EOS PCT: 1.7 %
HCT: 30.8 % — ABNORMAL LOW (ref 35.0–47.0)
HGB: 10.4 g/dL — AB (ref 12.0–16.0)
LYMPHS ABS: 1.6 10*3/uL (ref 1.0–3.6)
LYMPHS PCT: 28.6 %
MCH: 26.6 pg (ref 26.0–34.0)
MCHC: 33.9 g/dL (ref 32.0–36.0)
MCV: 78 fL — AB (ref 80–100)
MONO ABS: 0.3 x10 3/mm (ref 0.2–0.9)
MONOS PCT: 6.3 %
Neutrophil #: 3.4 10*3/uL (ref 1.4–6.5)
Neutrophil %: 62.8 %
Platelet: 199 10*3/uL (ref 150–440)
RBC: 3.93 10*6/uL (ref 3.80–5.20)
RDW: 16.4 % — ABNORMAL HIGH (ref 11.5–14.5)
WBC: 5.5 10*3/uL (ref 3.6–11.0)

## 2013-11-07 LAB — URINALYSIS, COMPLETE
BILIRUBIN, UR: NEGATIVE
BLOOD: NEGATIVE
GLUCOSE, UR: NEGATIVE mg/dL (ref 0–75)
KETONE: NEGATIVE
LEUKOCYTE ESTERASE: NEGATIVE
Nitrite: NEGATIVE
PH: 5 (ref 4.5–8.0)
Protein: NEGATIVE
SPECIFIC GRAVITY: 1.015 (ref 1.003–1.030)
Squamous Epithelial: 3
WBC UR: 3 /HPF (ref 0–5)

## 2013-11-07 LAB — CK TOTAL AND CKMB (NOT AT ARMC)
CK, Total: 69 U/L
CK, Total: 72 U/L
CK-MB: 0.6 ng/mL (ref 0.5–3.6)
CK-MB: 0.6 ng/mL (ref 0.5–3.6)

## 2013-11-07 LAB — TROPONIN I: Troponin-I: <0.02 ng/mL

## 2013-11-07 LAB — SEDIMENTATION RATE: ERYTHROCYTE SED RATE: 20 mm/h (ref 0–20)

## 2013-11-07 LAB — MAGNESIUM: Magnesium: 2 mg/dL

## 2013-11-08 LAB — LIPID PANEL
Cholesterol: 179 mg/dL (ref 0–200)
HDL: 40 mg/dL (ref 40–60)
Ldl Cholesterol, Calc: 117 mg/dL — ABNORMAL HIGH (ref 0–100)
Triglycerides: 112 mg/dL (ref 0–200)
VLDL Cholesterol, Calc: 22 mg/dL (ref 5–40)

## 2013-11-19 IMAGING — MR MRI OF THE LEFT KNEE WITHOUT CONTRAST
5 of 6 series · 28 of 40 positions shown · IV contrast (gadolinium)
Comparison: none

REASON FOR EXAM: knee pain
COMMENTS:

PROCEDURE:     MR  - MR KNEE LT  WO CONTRAST  - September 03, 2012  [DATE]
RESULT:
TECHNIQUE: Multiplanar and multisequence imaging of the left knee was
obtained without the administration of gadolinium.

[Series 3: T1 · axial · 3.0mm · 0.50mm/px · z∈[-58,+125]mm · 6 of 30 slices shown (1 of 2)]
[im 1/30]
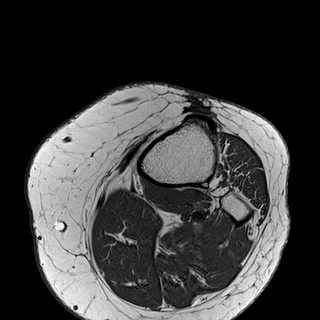
[im 6/30]
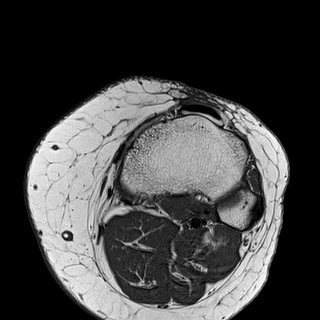
[im 12/30]
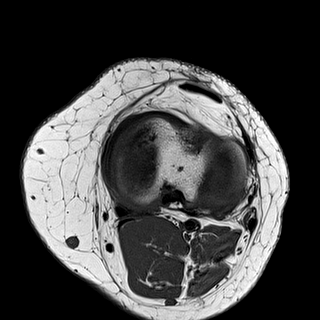
[im 18/30]
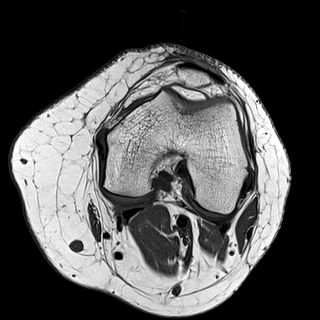
[im 24/30]
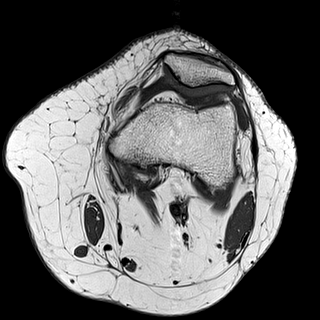
[im 30/30]
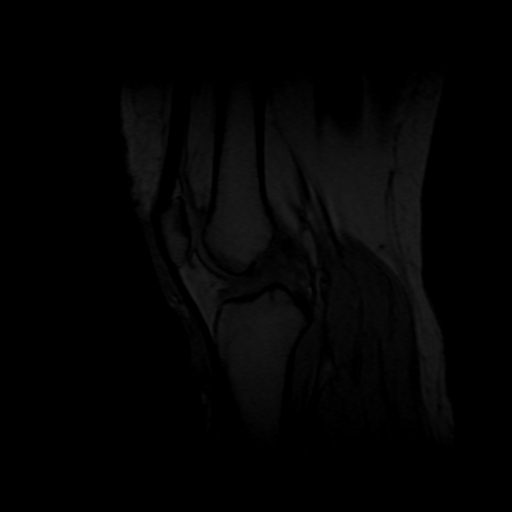

[Series 5: T2 fat-sat · axial · 3.0mm · 0.62mm/px · z∈[+5,+78]mm · 7 of 31 slices shown (1 of 2)]
[im 1/31]
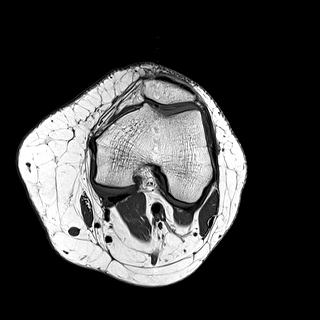
[im 6/31]
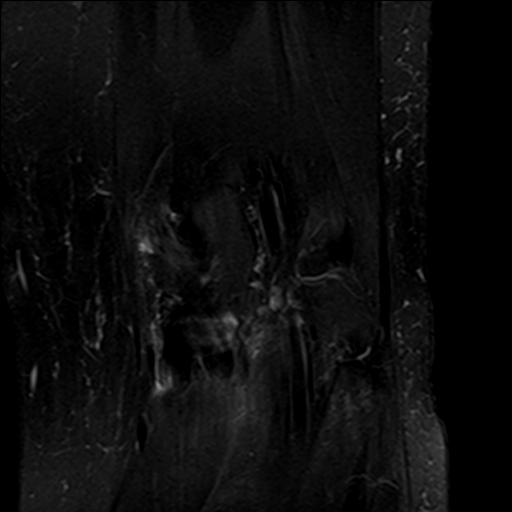
[im 11/31]
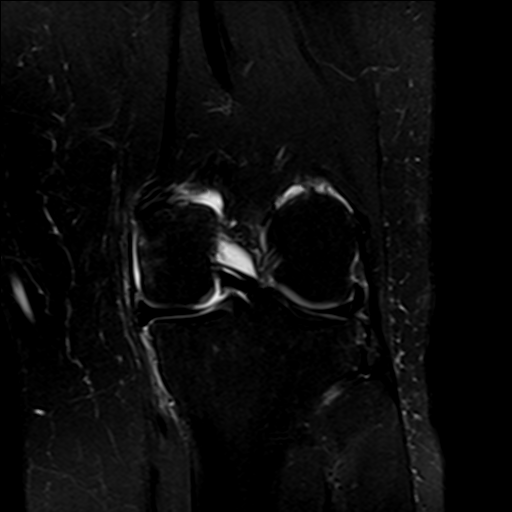
[im 16/31]
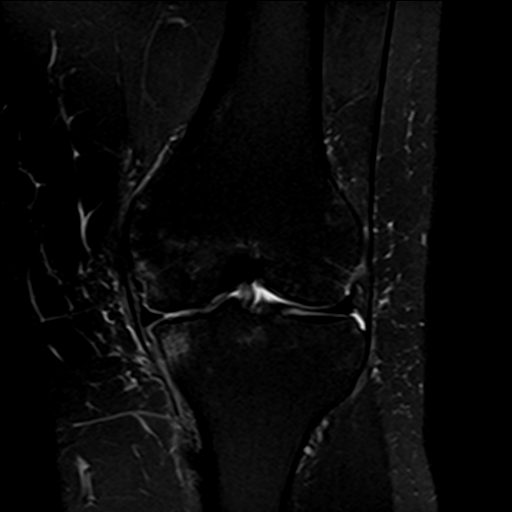
[im 21/31]
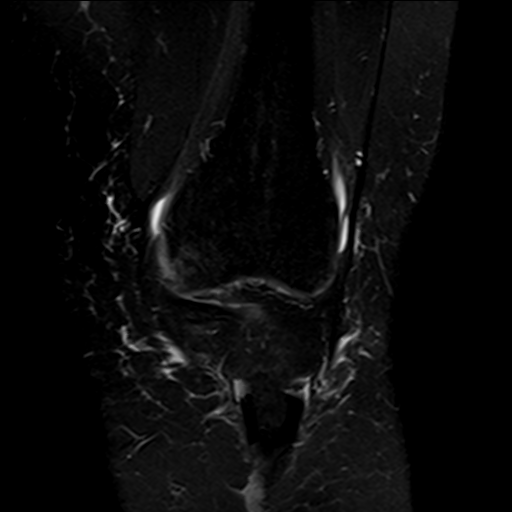
[im 26/31]
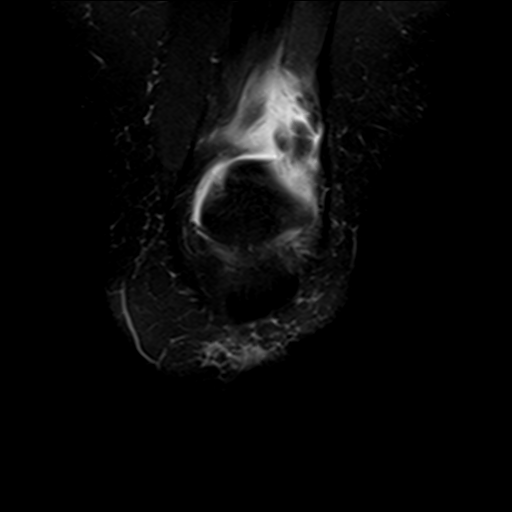
[im 31/31]
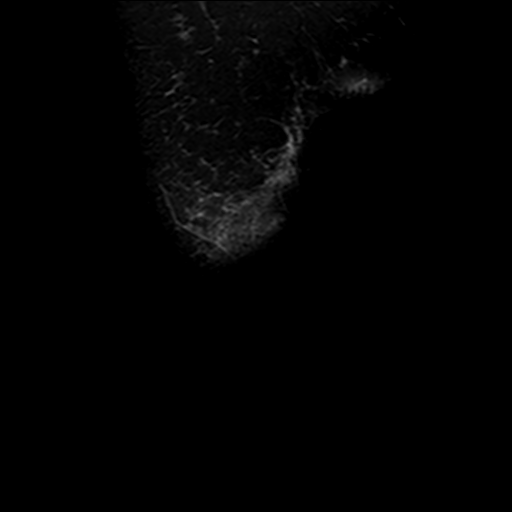

[Series 6: T1 · axial · 3.0mm · 0.62mm/px · 1 of 31 slices shown (2 of 2)]
[im 1/31]
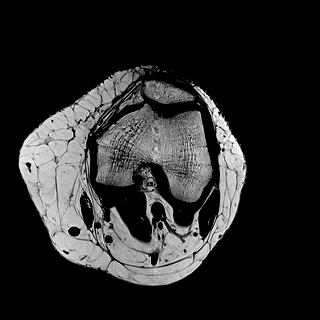

[Series 7: T2 fat-sat · axial · 3.0mm · 0.63mm/px · z∈[+5,+81]mm · 7 of 30 slices shown (2 of 2)]
[im 1/30]
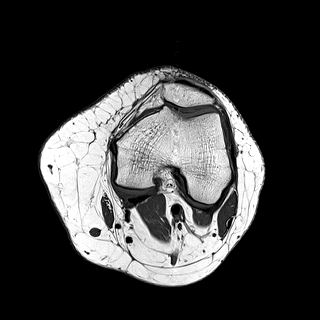
[im 5/30]
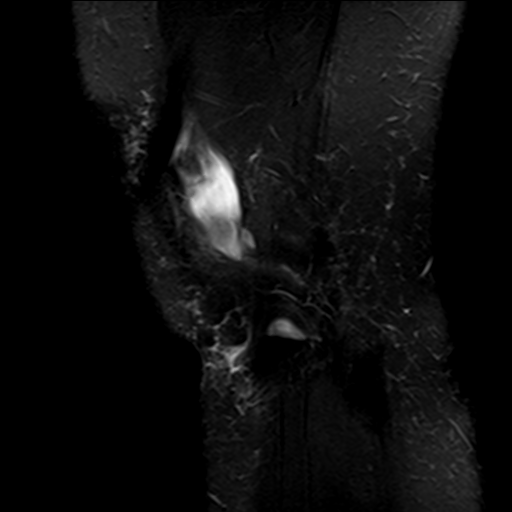
[im 10/30]
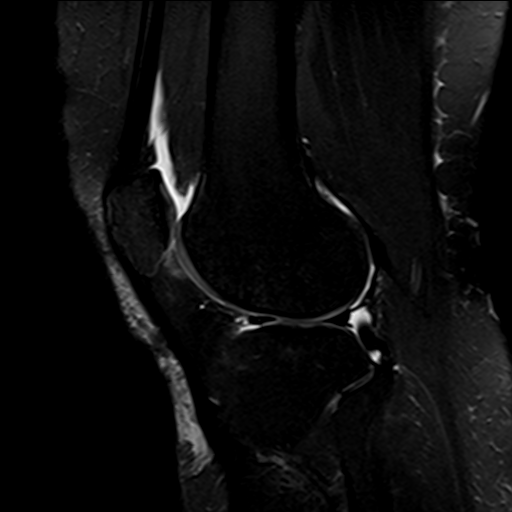
[im 15/30]
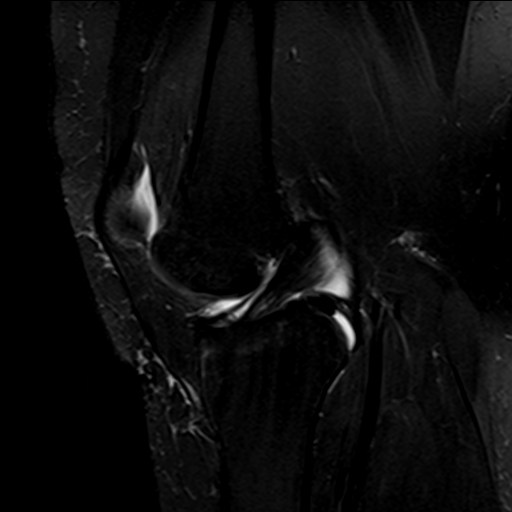
[im 20/30]
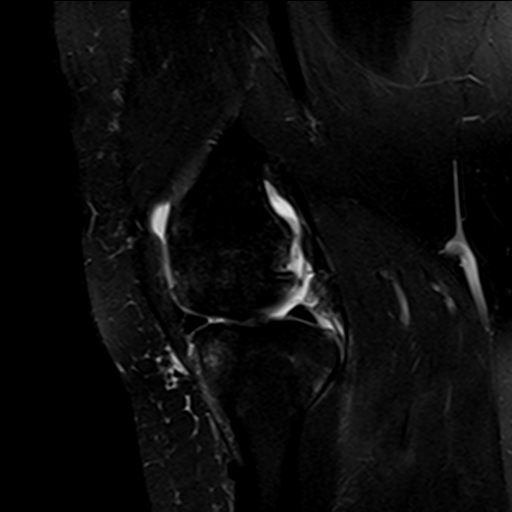
[im 25/30]
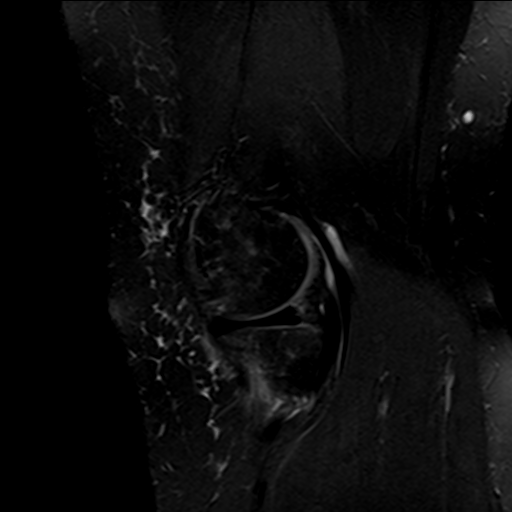
[im 30/30]
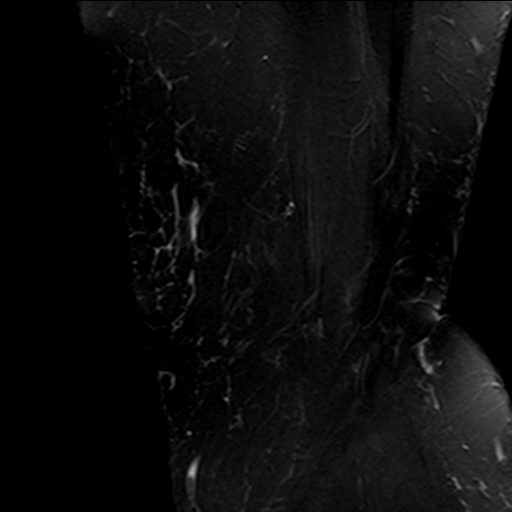

[Series 8: PD fat-sat · axial · 6.0mm · 0.72mm/px · z∈[+9,+81]mm · 7 of 30 slices shown]
[im 1/30]
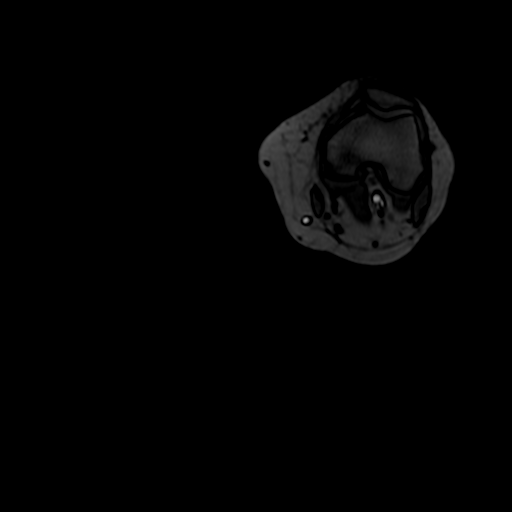
[im 5/30]
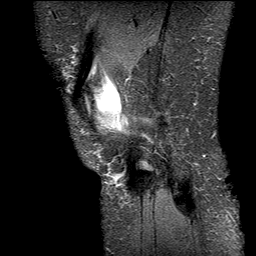
[im 10/30]
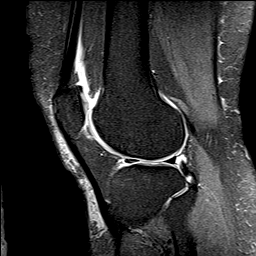
[im 15/30]
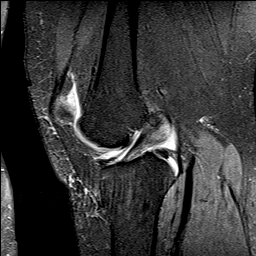
[im 20/30]
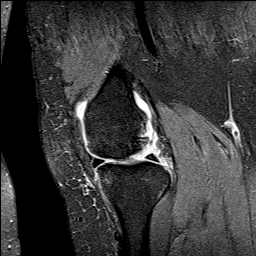
[im 25/30]
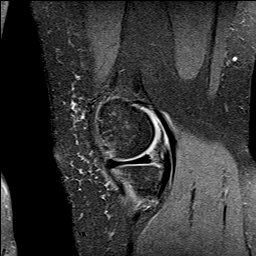
[im 30/30]
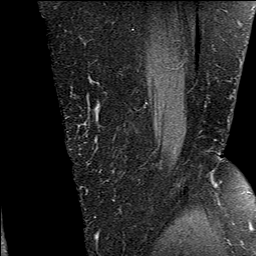

[28 of 40 positions shown; findings below may reference images not displayed]

FINDINGS: The osseous structures demonstrate areas of edema along the
anterior aspect of the central portion of the medial tibial plateau as well
as to a lesser degree the adjacent femoral condyle. These areas likely
represent reactive edema. Findings to a lesser degree are also identified
within the posterior aspect of the medial tibial plateau and adjacent
condyle. The neurovascular bundle is intact.

There is mild surface irregularity involving the subpatellar cartilage.
Partial-thickness loss is identified within the adjacent trochlear
cartilage. The articular cartilage of the femoral condyles and tibial
plateaus are maintained.

The quadriceps tendon, subpatellar ligament, lateral collateral ligamentous
complex, medial collateral ligament, anterior and posterior cruciate
ligaments are intact.

Evaluation of the menisci demonstrates a complex tear within the root of the
posterior horn medial meniscus.
IMPRESSION: 1. Complex tear within the root of the posterior horn of the medial meniscus.
2. Osteoarthritic changes as described above.

## 2014-03-01 IMAGING — CR DG CHEST 1V PORT
1 series · 1 of 1 positions shown · non-contrast
Comparison: none

REASON FOR EXAM: short of breath
COMMENTS:

PROCEDURE:     DXR - DXR PORTABLE CHEST SINGLE VIEW  - December 14, 2012  [DATE]
RESULT:     Comparison: 07/14/2012

[ap]
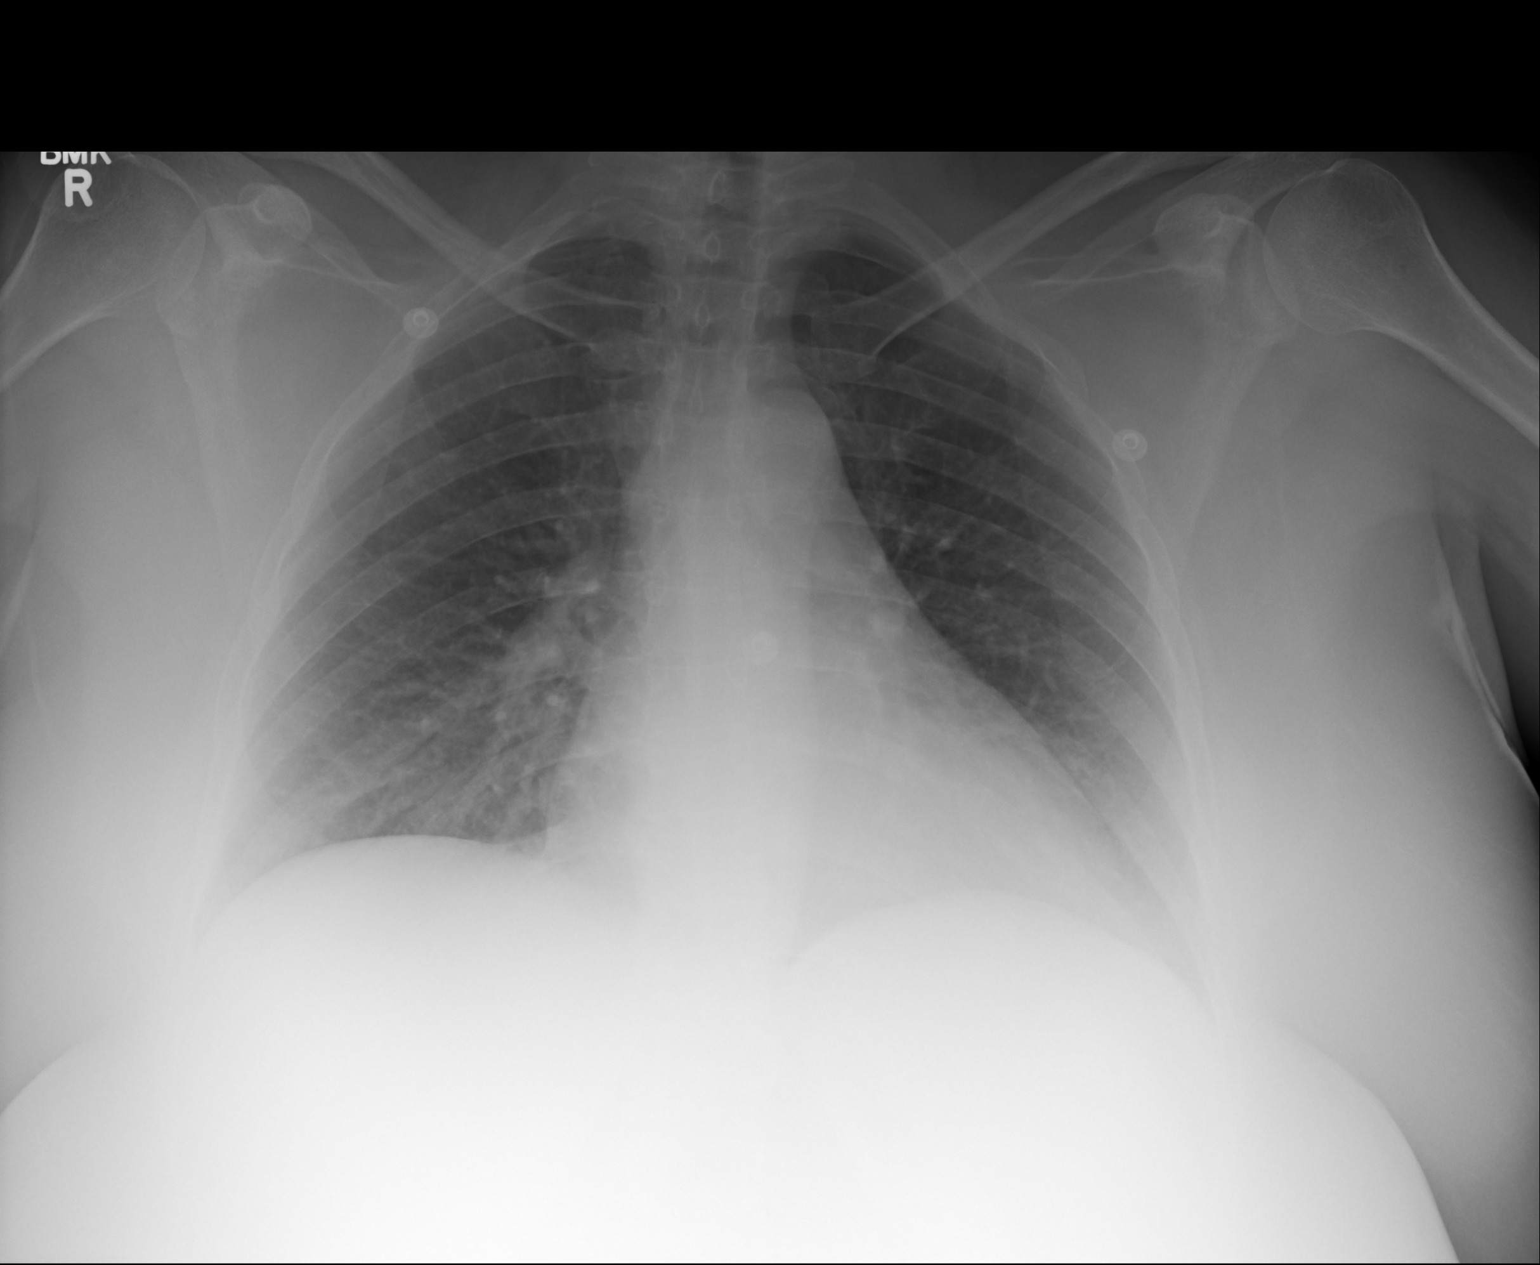

[1 of 1 positions shown; findings below may reference images not displayed]

FINDINGS: The heart and mediastinum are stable. No focal pulmonary opacities. Slight
increased density in the lower lungs is felt to be secondary to overlying
soft tissue.
IMPRESSION: No acute cardiopulmonary disease.

[REDACTED]

## 2014-03-01 IMAGING — US US EXTREM LOW VENOUS BILAT
1 series · 14 of 24 positions shown · non-contrast
Comparison: none

REASON FOR EXAM: LLE SWELLING AND PAIN, PLEASE EVALUATE BEDSIDE
COMMENTS:

PROCEDURE:     US  - US DOPPLER LOW EXTR BILATERAL  - December 14, 2012 [DATE]
RESULT:     Comparison: None

[Series 1: us extrem low venous bilat · 0.11mm/px · 14 of 35 slices shown]
[im 1/35]
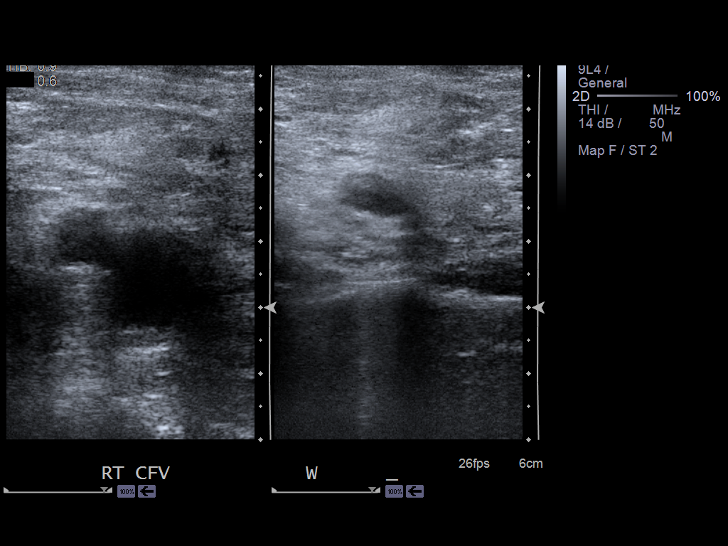
[im 3/35]
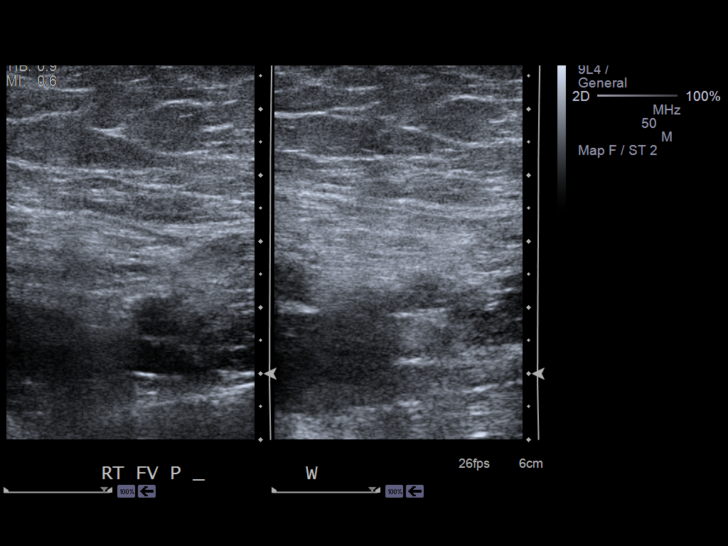
[im 6/35]
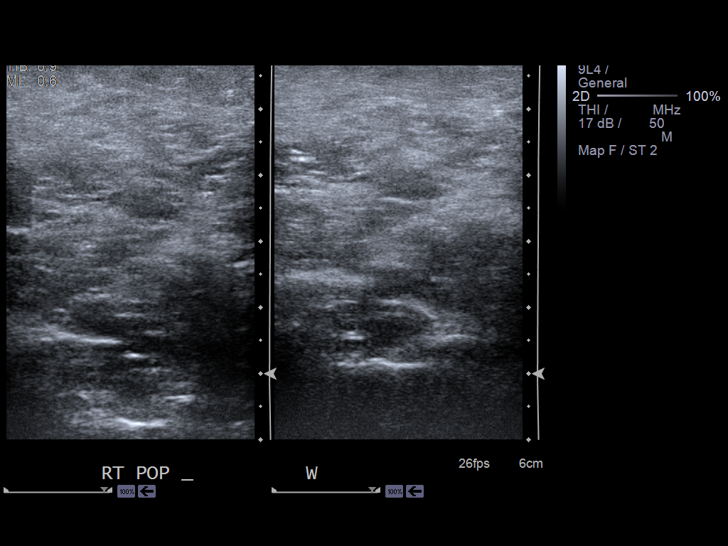
[im 9/35]
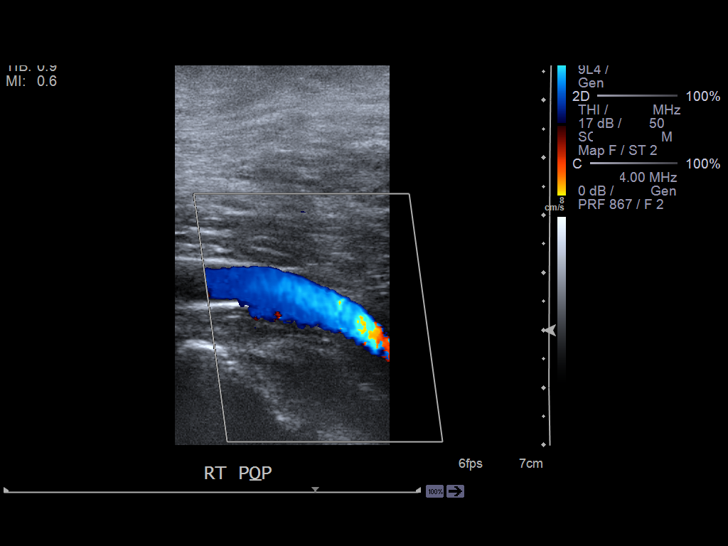
[im 11/35]
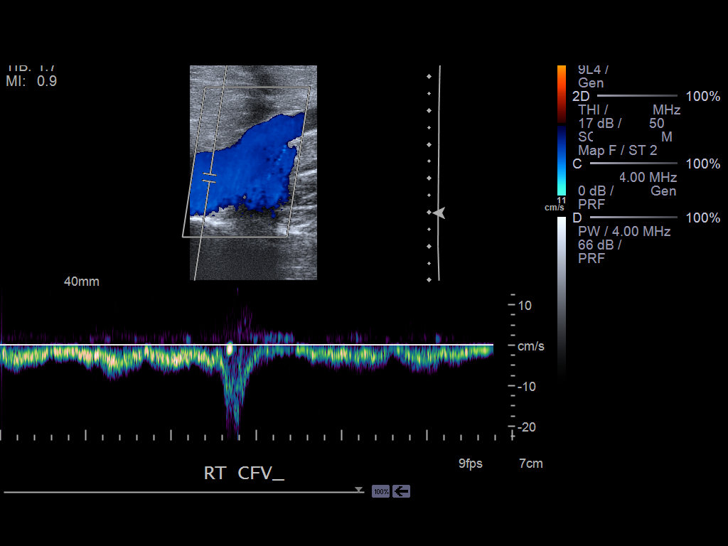
[im 14/35]
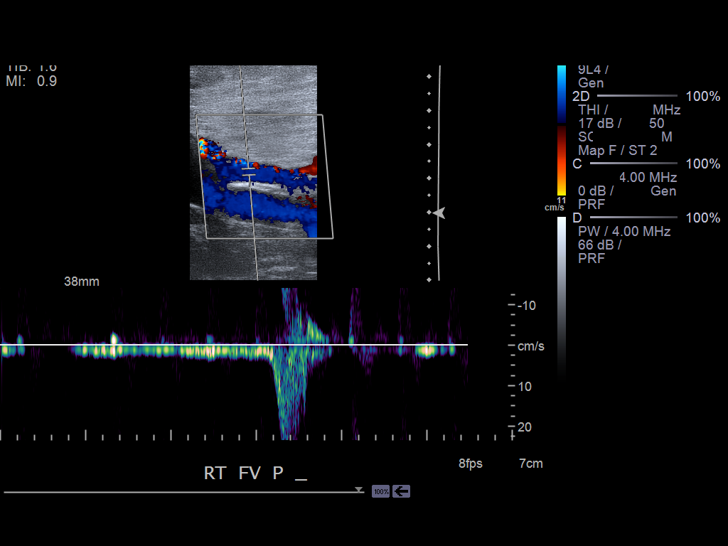
[im 17/35]
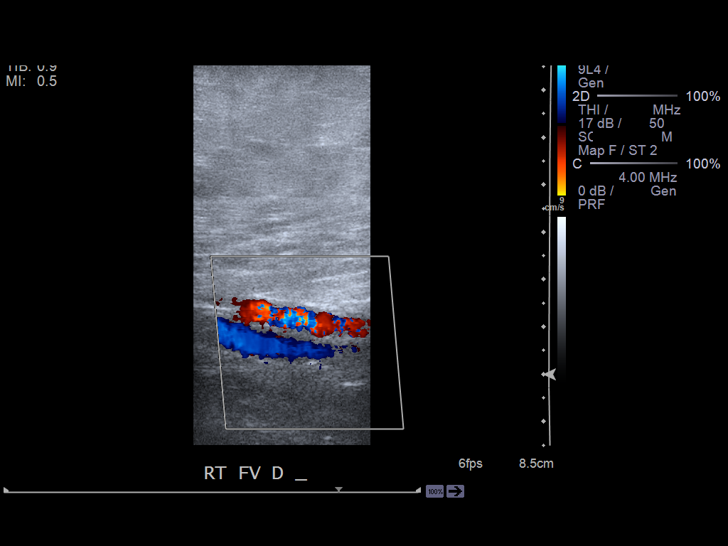
[im 18/35]
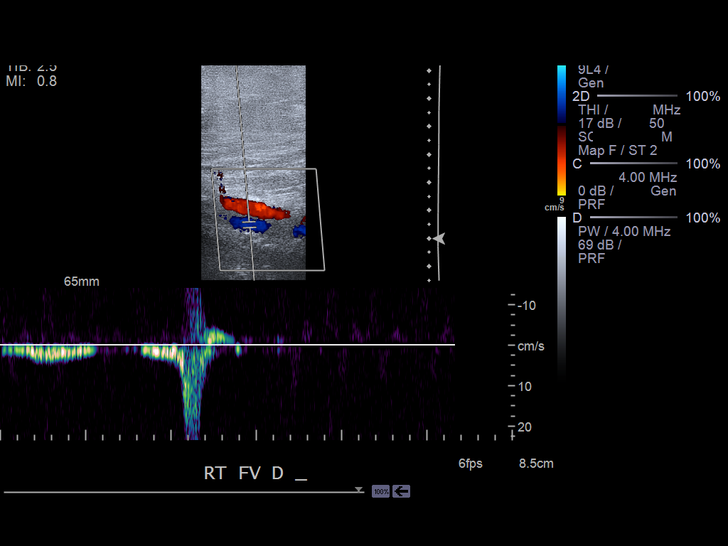
[im 21/35]
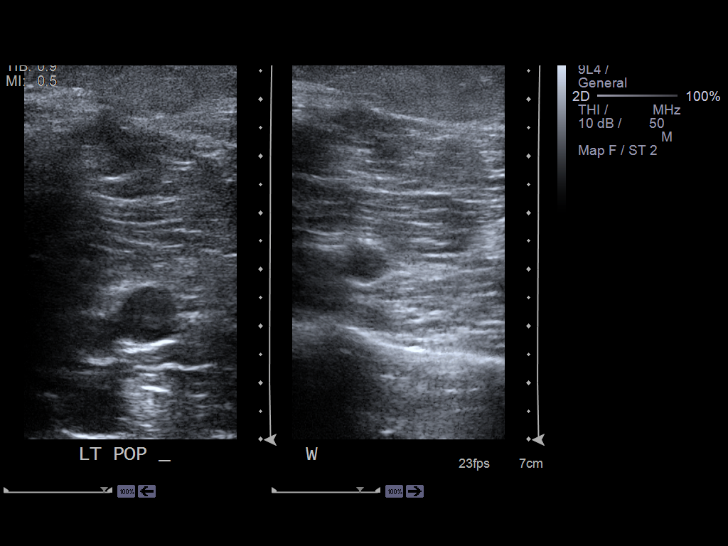
[im 24/35]
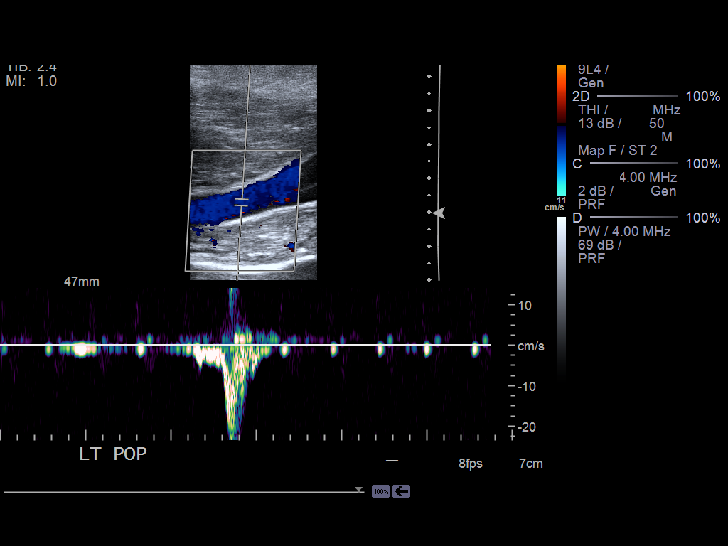
[im 27/35]
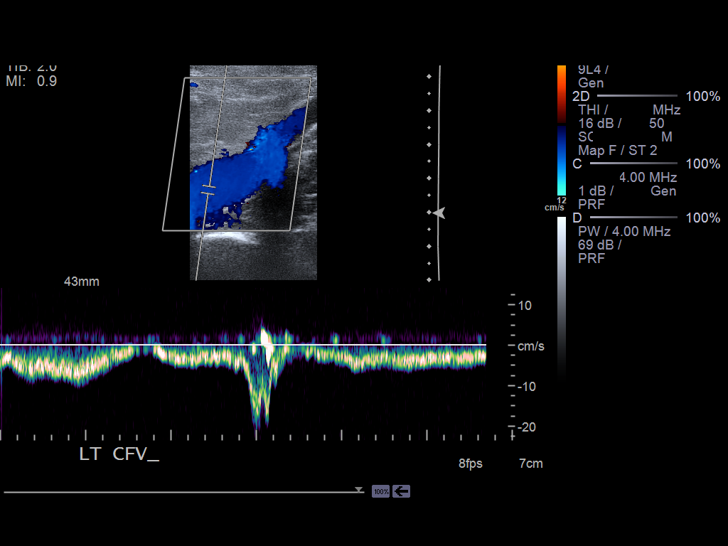
[im 29/35]
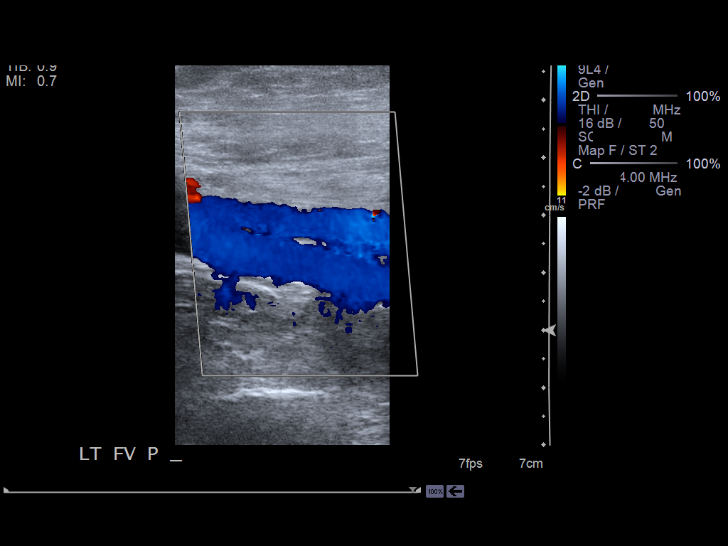
[im 32/35]
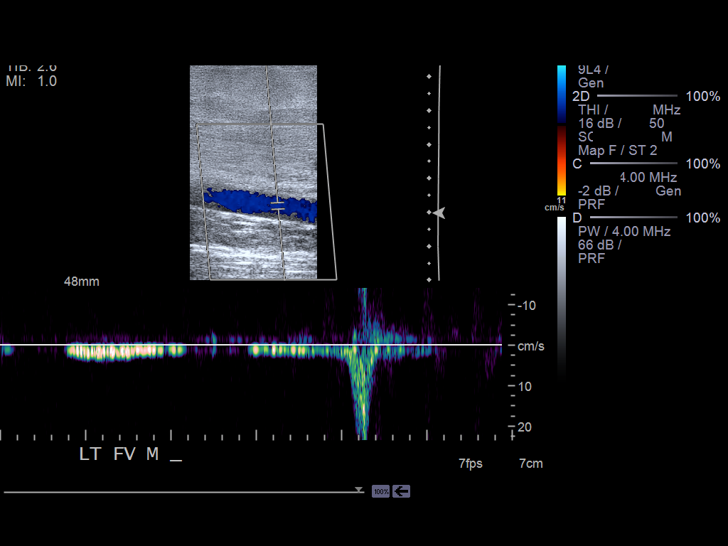
[im 35/35]
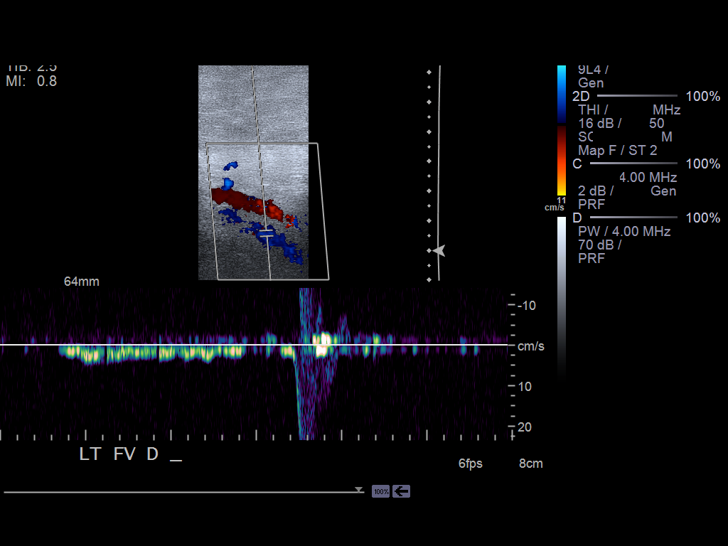

[14 of 24 positions shown; findings below may reference images not displayed]

FINDINGS: Multiple longitudinal and transverse gray-scale as well as color
and spectral Doppler images of the bilateral lower extremity veins were
obtained from the common femoral veins through the popliteal veins.

The bilateral common femoral, femoral, and popliteal veins are patent,
demonstrating normal color-flow and compressibility. No intraluminal
thrombus is identified.There is normal respiratory variation and
augmentation demonstrated at all vein levels.
IMPRESSION: No evidence of DVT in the bilateral lower extremities.

[REDACTED]

## 2014-03-02 IMAGING — CT CT CHEST W/ CM
1 series · 15 of 34 positions shown, 19 images · IV contrast (isovue)
Comparison: none

REASON FOR EXAM: severe chest pressure sob, eval for PE/ previous h/o
dvt./// dye allergy, pt on
COMMENTS:

PROCEDURE:     CT  - CT CHEST (FOR PE) W  - December 15, 2012 [DATE]
RESULT:     Comparison: 11/18/2011
TECHNIQUE: Multiple thin section axial images were obtained from the lung
apices to the upper abdomen following 80 ml Isovue 370 intravenous contrast,
according to the PE protocol. These images were also reviewed on a Siemens
multiplanar work station. The patient received a standard 13 hours steroid
preparation secondary to contrast allergy. The patient still had an allergic
reaction with hives, subjective shortness of breath, and chest pressure. By
report from the technologist, the patient remained stable and was taken back
to the inpatient room with her nurse.

[Series 4: soft tissue · axial · 0.71mm/px · z∈[-304,-67]mm · 15 of 93 slices shown, 19 images]
[im 7/93  mediastinal]
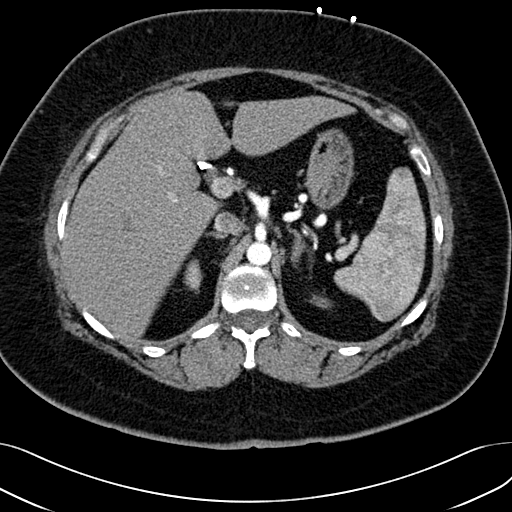
[im 7/93  lung]
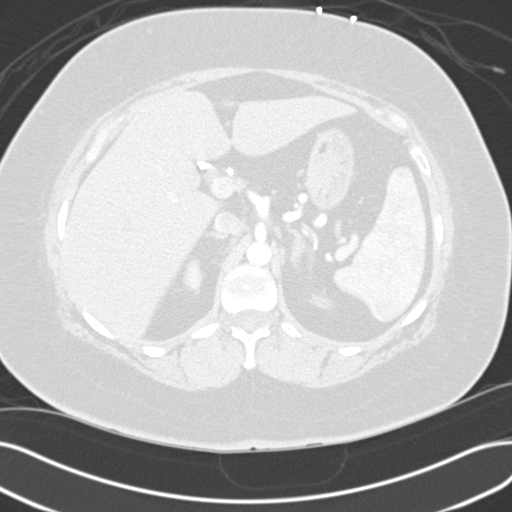
[im 14/93  lung]
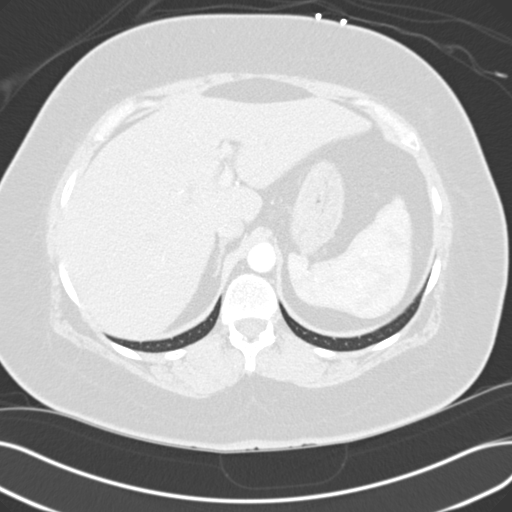
[im 19/93  lung]
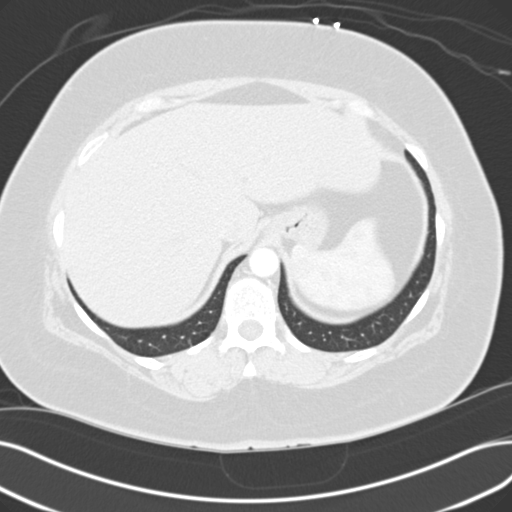
[im 24/93  lung]
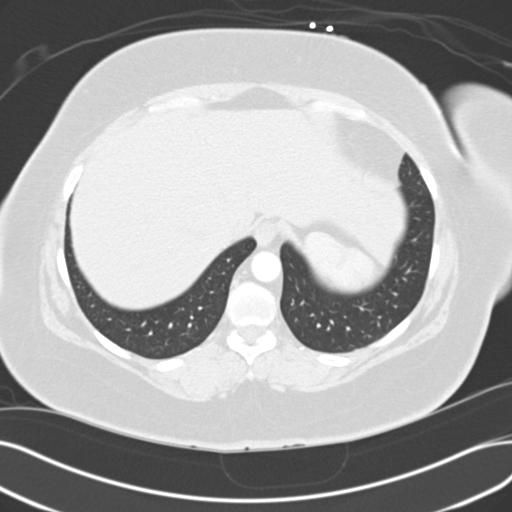
[im 31/93  mediastinal]
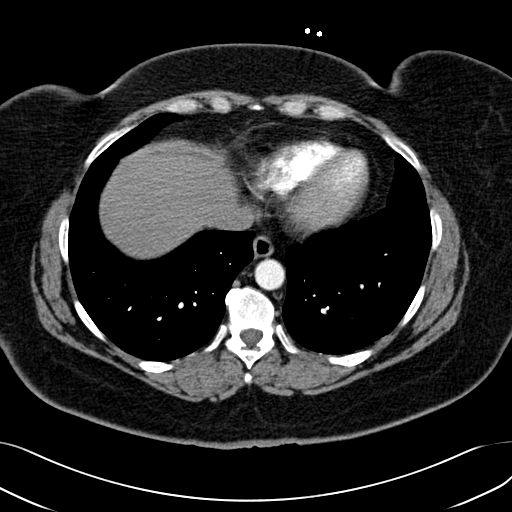
[im 31/93  lung]
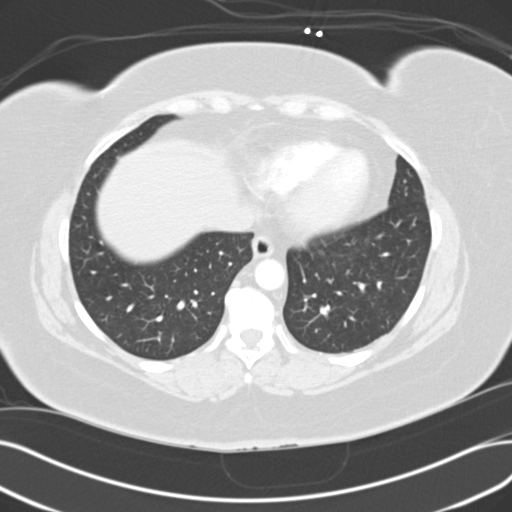
[im 37/93  lung]
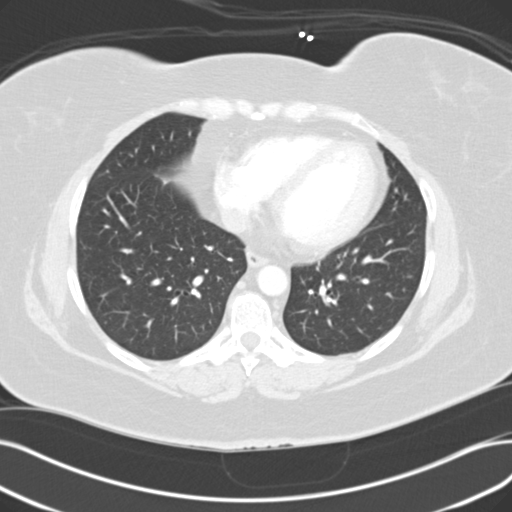
[im 41/93  lung]
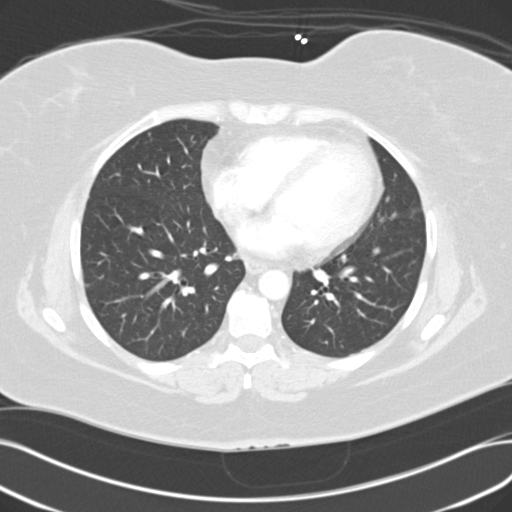
[im 48/93  lung]
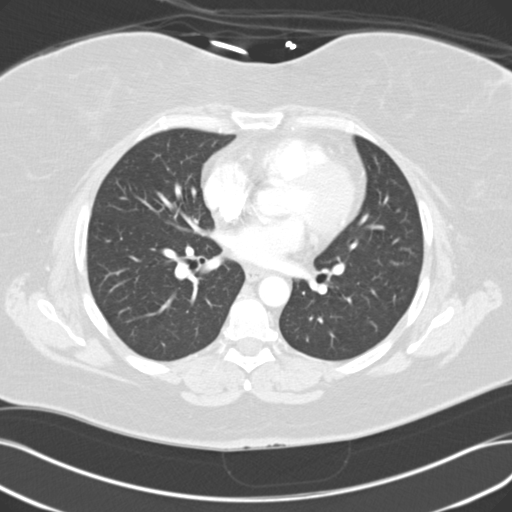
[im 52/93  mediastinal]
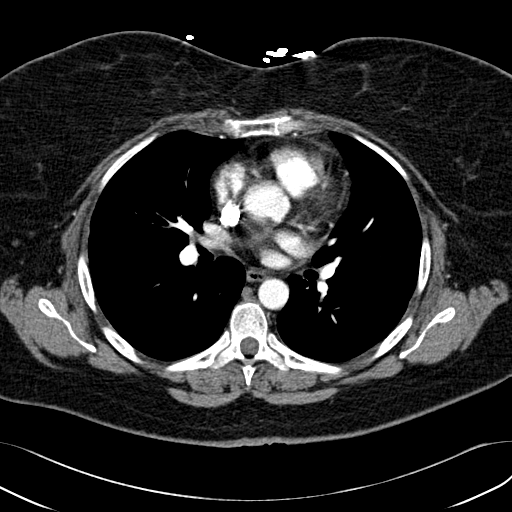
[im 52/93  lung]
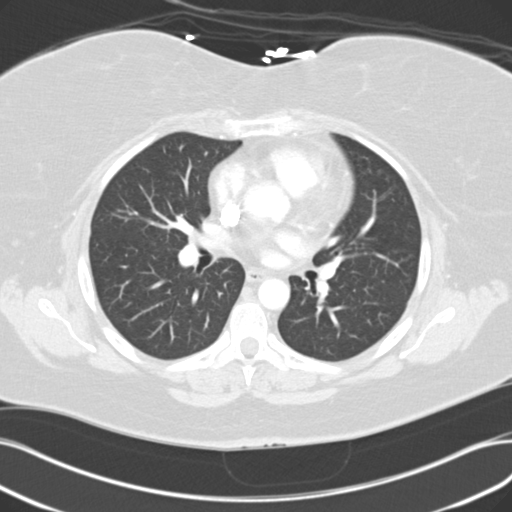
[im 56/93  lung]
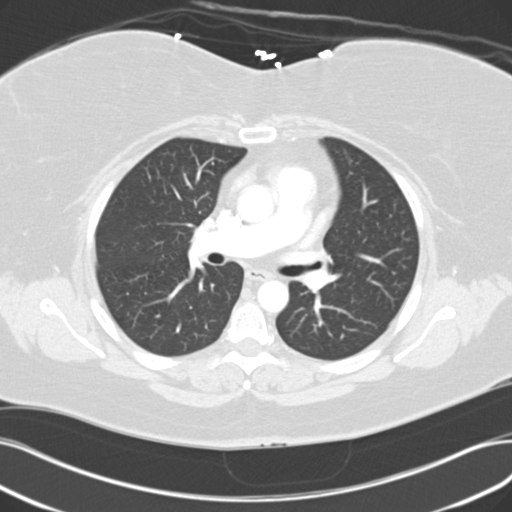
[im 62/93  lung]
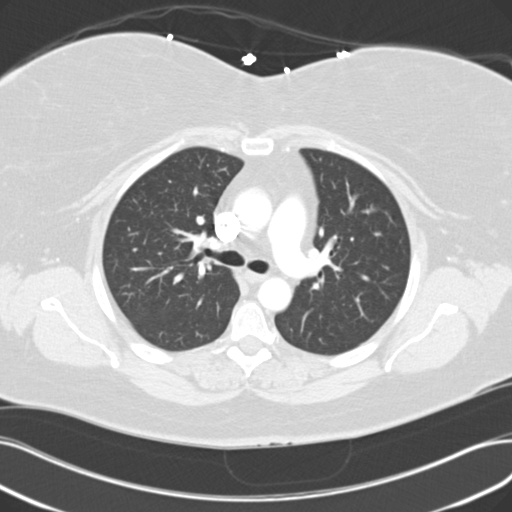
[im 69/93  lung]
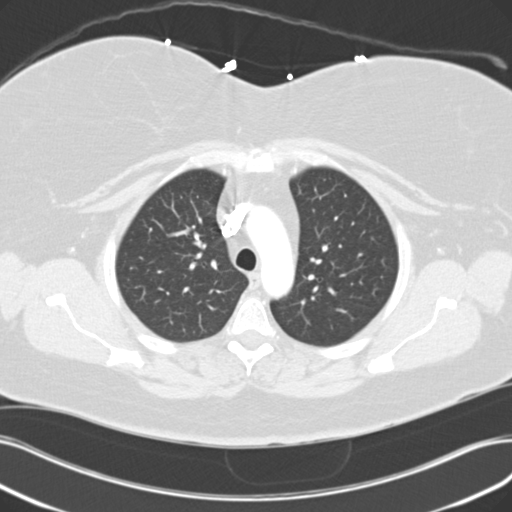
[im 74/93  mediastinal]
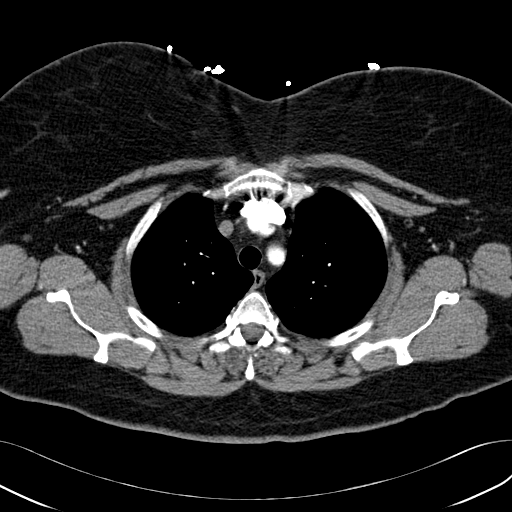
[im 74/93  lung]
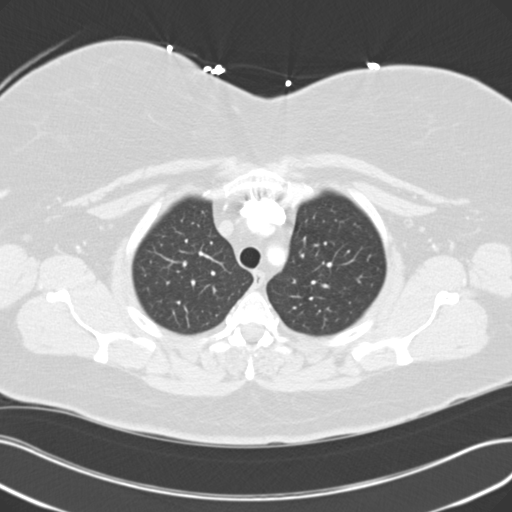
[im 79/93  lung]
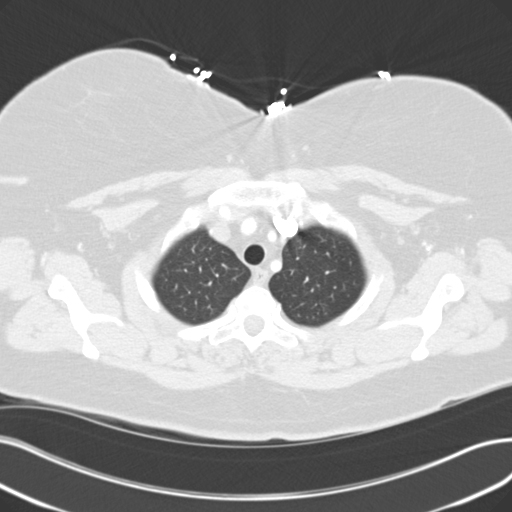
[im 86/93  lung]
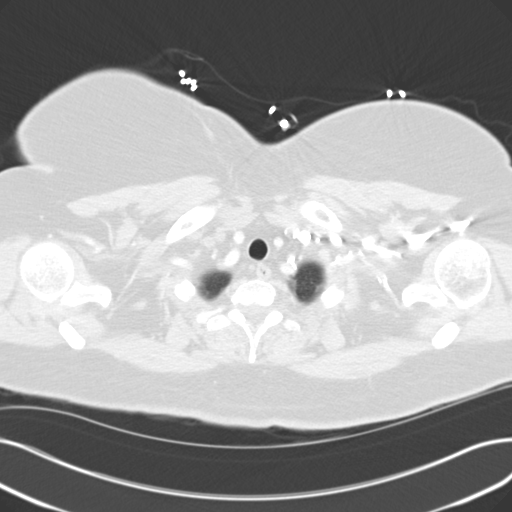

[15 of 34 positions shown; findings below may reference images not displayed]

FINDINGS: There is mild heterogeneity of the right lobe the thyroid gland, similar to
prior. This is nonspecific. No mediastinal, hilar, or axillary
lymphadenopathy. Surgical clips are seen from prior cholecystectomy.

The thoracic aorta is normal in caliber. No pulmonary embolus identified.

No focal pulmonary opacities. 3 mm nodule in the left upper lobe is similar
to prior. 3 mm subpleural nodule in the right lower lobe is similar to prior.

No aggressive lytic or sclerotic osseous lesions are identified.
IMPRESSION: 1. No pulmonary embolus identified.
2. The patient had a break through allergic reaction after administration of
contrast material, despite steroid preparation.

[REDACTED]

## 2014-03-12 ENCOUNTER — Emergency Department: Payer: Self-pay | Admitting: Internal Medicine

## 2014-03-12 LAB — BASIC METABOLIC PANEL
Anion Gap: 11 (ref 7–16)
BUN: 6 mg/dL — ABNORMAL LOW (ref 7–18)
Calcium, Total: 8.6 mg/dL (ref 8.5–10.1)
Chloride: 106 mmol/L (ref 98–107)
Co2: 22 mmol/L (ref 21–32)
Creatinine: 1.08 mg/dL (ref 0.60–1.30)
EGFR (African American): >60
EGFR (Non-African Amer.): >60
GLUCOSE: 154 mg/dL — AB (ref 65–99)
Osmolality: 278 (ref 275–301)
Potassium: 3.6 mmol/L (ref 3.5–5.1)
Sodium: 139 mmol/L (ref 136–145)

## 2014-03-12 LAB — CBC
HCT: 37.5 % (ref 35.0–47.0)
HGB: 12.2 g/dL (ref 12.0–16.0)
MCH: 26.2 pg (ref 26.0–34.0)
MCHC: 32.6 g/dL (ref 32.0–36.0)
MCV: 80 fL (ref 80–100)
PLATELETS: 284 10*3/uL (ref 150–440)
RBC: 4.67 10*6/uL (ref 3.80–5.20)
RDW: 16.2 % — ABNORMAL HIGH (ref 11.5–14.5)
WBC: 10.9 10*3/uL (ref 3.6–11.0)

## 2014-03-12 LAB — TROPONIN I

## 2014-04-15 ENCOUNTER — Observation Stay: Payer: Self-pay | Admitting: Internal Medicine

## 2014-04-15 LAB — PROTIME-INR
INR: 1
PROTHROMBIN TIME: 13 s (ref 11.5–14.7)

## 2014-04-15 LAB — LIPID PANEL
CHOLESTEROL: 181 mg/dL (ref 0–200)
HDL Cholesterol: 35 mg/dL — ABNORMAL LOW (ref 40–60)
LDL CHOLESTEROL, CALC: 115 mg/dL — AB (ref 0–100)
TRIGLYCERIDES: 155 mg/dL (ref 0–200)
VLDL Cholesterol, Calc: 31 mg/dL (ref 5–40)

## 2014-04-15 LAB — CBC
HCT: 37.6 % (ref 35.0–47.0)
HGB: 12.2 g/dL (ref 12.0–16.0)
MCH: 25.5 pg — ABNORMAL LOW (ref 26.0–34.0)
MCHC: 32.4 g/dL (ref 32.0–36.0)
MCV: 79 fL — ABNORMAL LOW (ref 80–100)
Platelet: 274 10*3/uL (ref 150–440)
RBC: 4.78 10*6/uL (ref 3.80–5.20)
RDW: 16.2 % — AB (ref 11.5–14.5)
WBC: 9 10*3/uL (ref 3.6–11.0)

## 2014-04-15 LAB — PRO B NATRIURETIC PEPTIDE: B-Type Natriuretic Peptide: 64 pg/mL (ref 0–125)

## 2014-04-15 LAB — BASIC METABOLIC PANEL
Anion Gap: 11 (ref 7–16)
BUN: 10 mg/dL (ref 7–18)
CO2: 23 mmol/L (ref 21–32)
Calcium, Total: 8.5 mg/dL (ref 8.5–10.1)
Chloride: 107 mmol/L (ref 98–107)
Creatinine: 1.09 mg/dL (ref 0.60–1.30)
EGFR (African American): >60
EGFR (Non-African Amer.): >60
Glucose: 109 mg/dL — ABNORMAL HIGH (ref 65–99)
Osmolality: 281 (ref 275–301)
Potassium: 3.4 mmol/L — ABNORMAL LOW (ref 3.5–5.1)
SODIUM: 141 mmol/L (ref 136–145)

## 2014-04-15 LAB — CK TOTAL AND CKMB (NOT AT ARMC)
CK, TOTAL: 66 U/L
CK, Total: 81 U/L
CK, Total: 56 U/L

## 2014-04-15 LAB — TROPONIN I
Troponin-I: <0.02 ng/mL
Troponin-I: <0.02 ng/mL
Troponin-I: <0.02 ng/mL

## 2014-04-15 LAB — HEMOGLOBIN A1C: Hemoglobin A1C: 6.1 % (ref 4.2–6.3)

## 2014-04-15 LAB — APTT: Activated PTT: 26.7 secs (ref 23.6–35.9)

## 2014-04-15 LAB — D-DIMER(ARMC): D-Dimer: 924 ng/ml

## 2014-04-16 LAB — CBC WITH DIFFERENTIAL/PLATELET
BASOS ABS: 0 10*3/uL (ref 0.0–0.1)
Basophil %: 0.4 %
EOS PCT: 3.3 %
Eosinophil #: 0.2 10*3/uL (ref 0.0–0.7)
HCT: 33.6 % — AB (ref 35.0–47.0)
HGB: 10.6 g/dL — ABNORMAL LOW (ref 12.0–16.0)
LYMPHS ABS: 1.1 10*3/uL (ref 1.0–3.6)
LYMPHS PCT: 17.8 %
MCH: 25.2 pg — ABNORMAL LOW (ref 26.0–34.0)
MCHC: 31.5 g/dL — ABNORMAL LOW (ref 32.0–36.0)
MCV: 80 fL (ref 80–100)
Monocyte #: 0.4 x10 3/mm (ref 0.2–0.9)
Monocyte %: 6.2 %
Neutrophil #: 4.4 10*3/uL (ref 1.4–6.5)
Neutrophil %: 72.3 %
Platelet: 210 10*3/uL (ref 150–440)
RBC: 4.19 10*6/uL (ref 3.80–5.20)
RDW: 16 % — ABNORMAL HIGH (ref 11.5–14.5)
WBC: 6 10*3/uL (ref 3.6–11.0)

## 2014-04-16 LAB — BASIC METABOLIC PANEL
Anion Gap: 2 — ABNORMAL LOW (ref 7–16)
BUN: 9 mg/dL (ref 7–18)
CREATININE: 1.14 mg/dL (ref 0.60–1.30)
Calcium, Total: 8.4 mg/dL — ABNORMAL LOW (ref 8.5–10.1)
Chloride: 110 mmol/L — ABNORMAL HIGH (ref 98–107)
Co2: 31 mmol/L (ref 21–32)
EGFR (African American): >60
EGFR (Non-African Amer.): 58 — ABNORMAL LOW
Glucose: 97 mg/dL (ref 65–99)
OSMOLALITY: 284 (ref 275–301)
Potassium: 4.1 mmol/L (ref 3.5–5.1)
Sodium: 143 mmol/L (ref 136–145)

## 2014-04-17 DIAGNOSIS — G4733 Obstructive sleep apnea (adult) (pediatric): Secondary | ICD-10-CM | POA: Insufficient documentation

## 2014-07-07 ENCOUNTER — Encounter: Payer: Self-pay | Admitting: General Surgery

## 2014-08-16 IMAGING — CR DG CHEST 2V
1 series · 2 of 2 positions shown · non-contrast
Comparison: none

REASON FOR EXAM: Chest Pain
COMMENTS:

[Series 1: pa · 0.17mm/px · 2 of 2 slices shown]
[im 1/2]
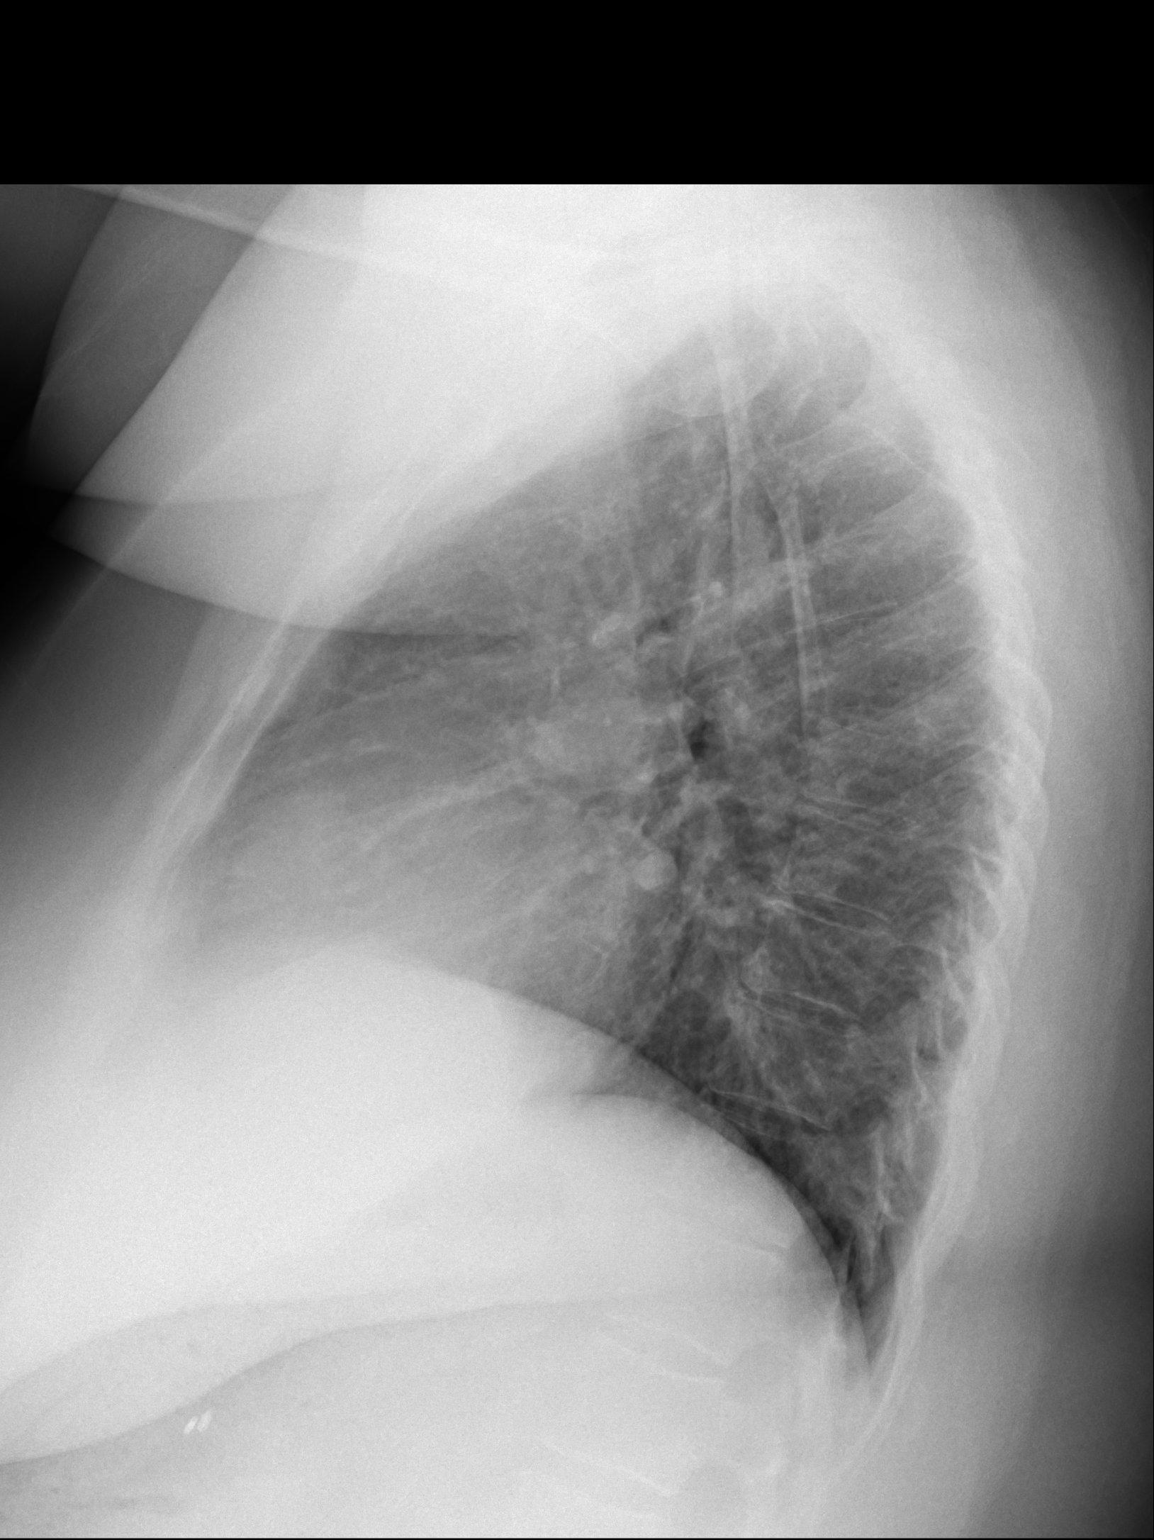
[im 2/2]
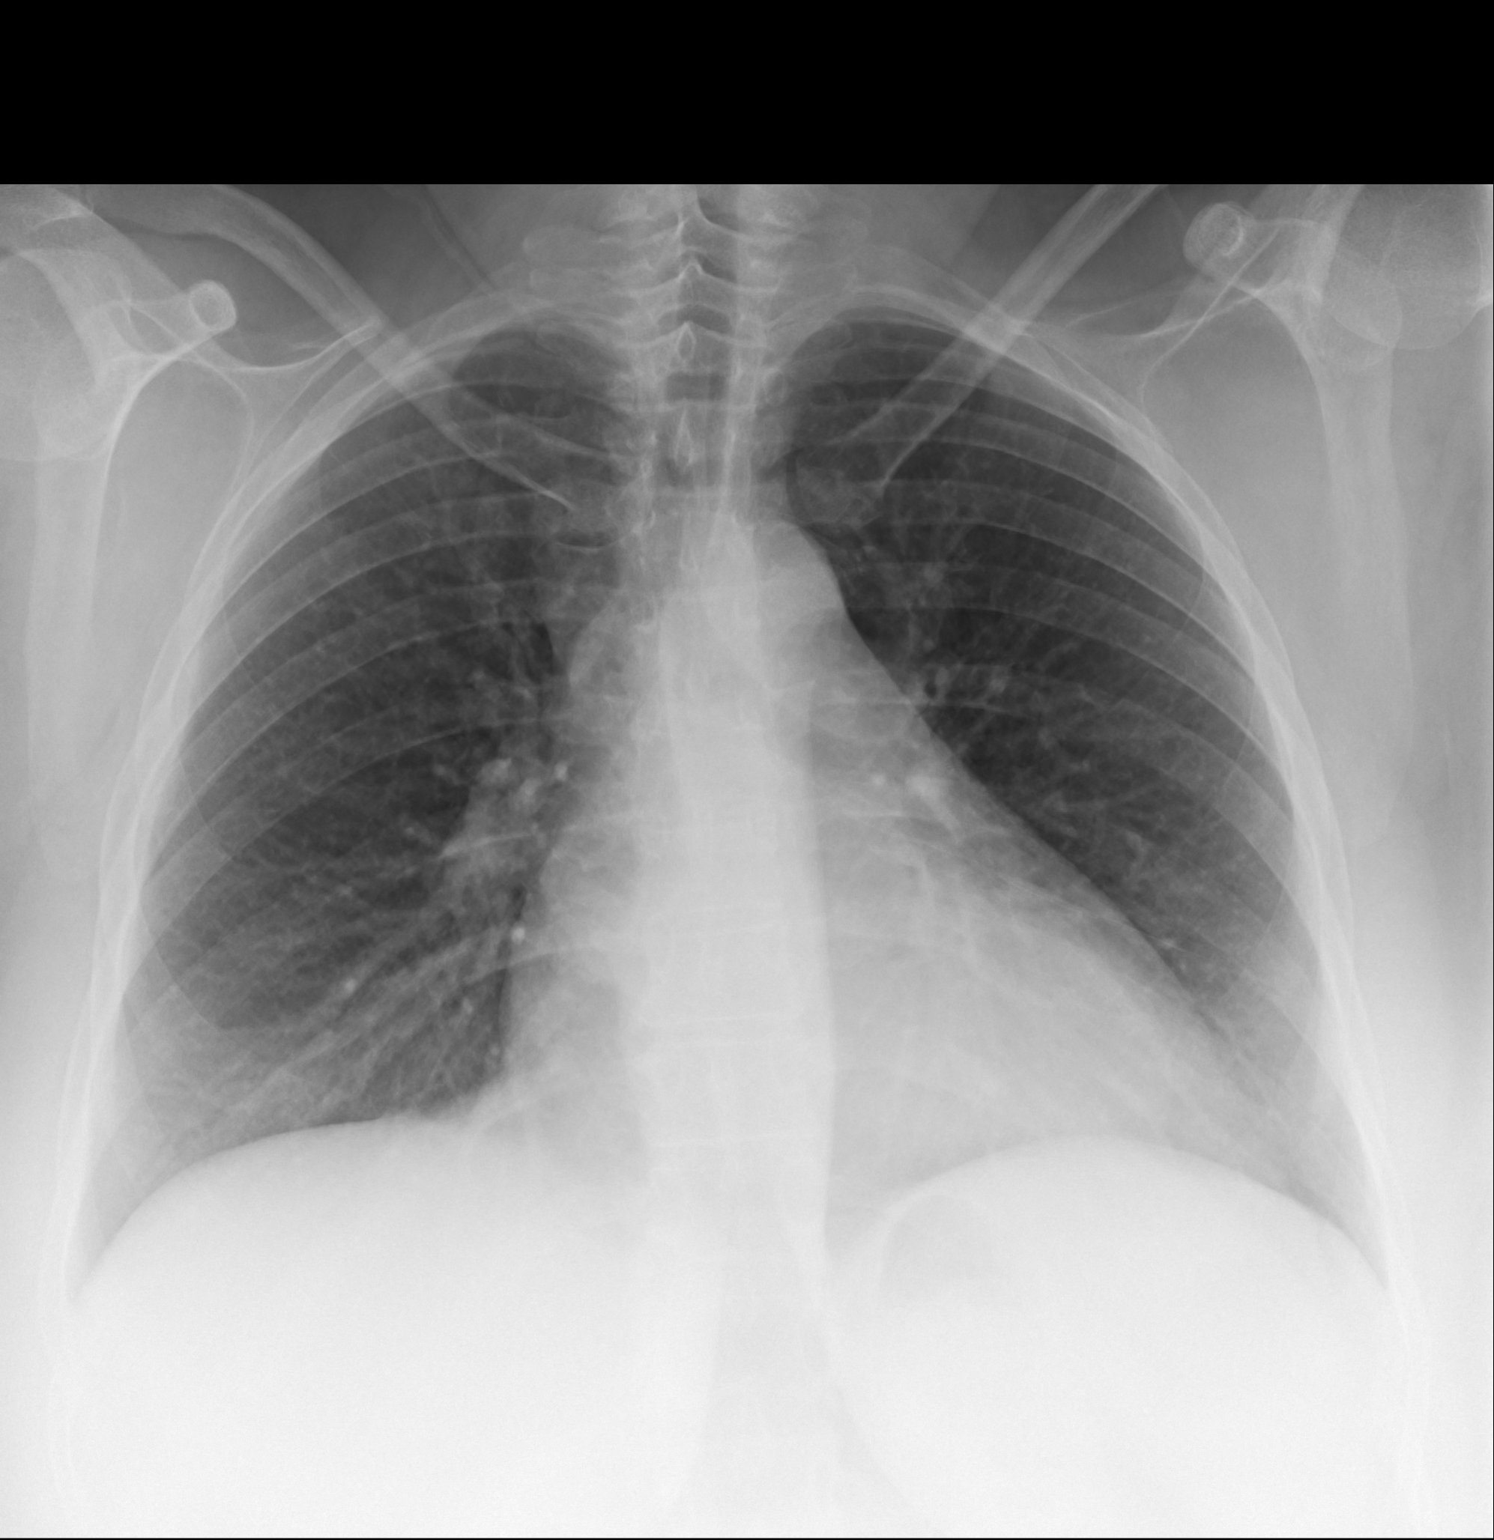

[2 of 2 positions shown; findings below may reference images not displayed]

PROCEDURE:     DXR - DXR CHEST PA (OR AP) AND LATERAL  - May 31, 2013  [DATE]

RESULT:     Comparison is made to the study December 14, 2012.

The cardiopericardial silhouette remains enlarged. The central pulmonary
vascularity is mildly prominent. There is no pleural effusion or
pneumothorax. There is no alveolar infiltrate. The observed portions of the
bony thorax are normal.
IMPRESSION: Enlargement of the cardiac silhouette and mild pulmonary
vascular congestion suggest CHF. I cannot exclude a pericardial effusion as
well.

[REDACTED]

## 2014-08-18 IMAGING — CR DG CHEST 2V
1 series · 2 of 2 positions shown · non-contrast
Comparison: none

REASON FOR EXAM: needed for lung vq scan
COMMENTS:

[Series 1: w chest pa · 0.14mm/px · 2 of 2 slices shown]
[im 1/2]
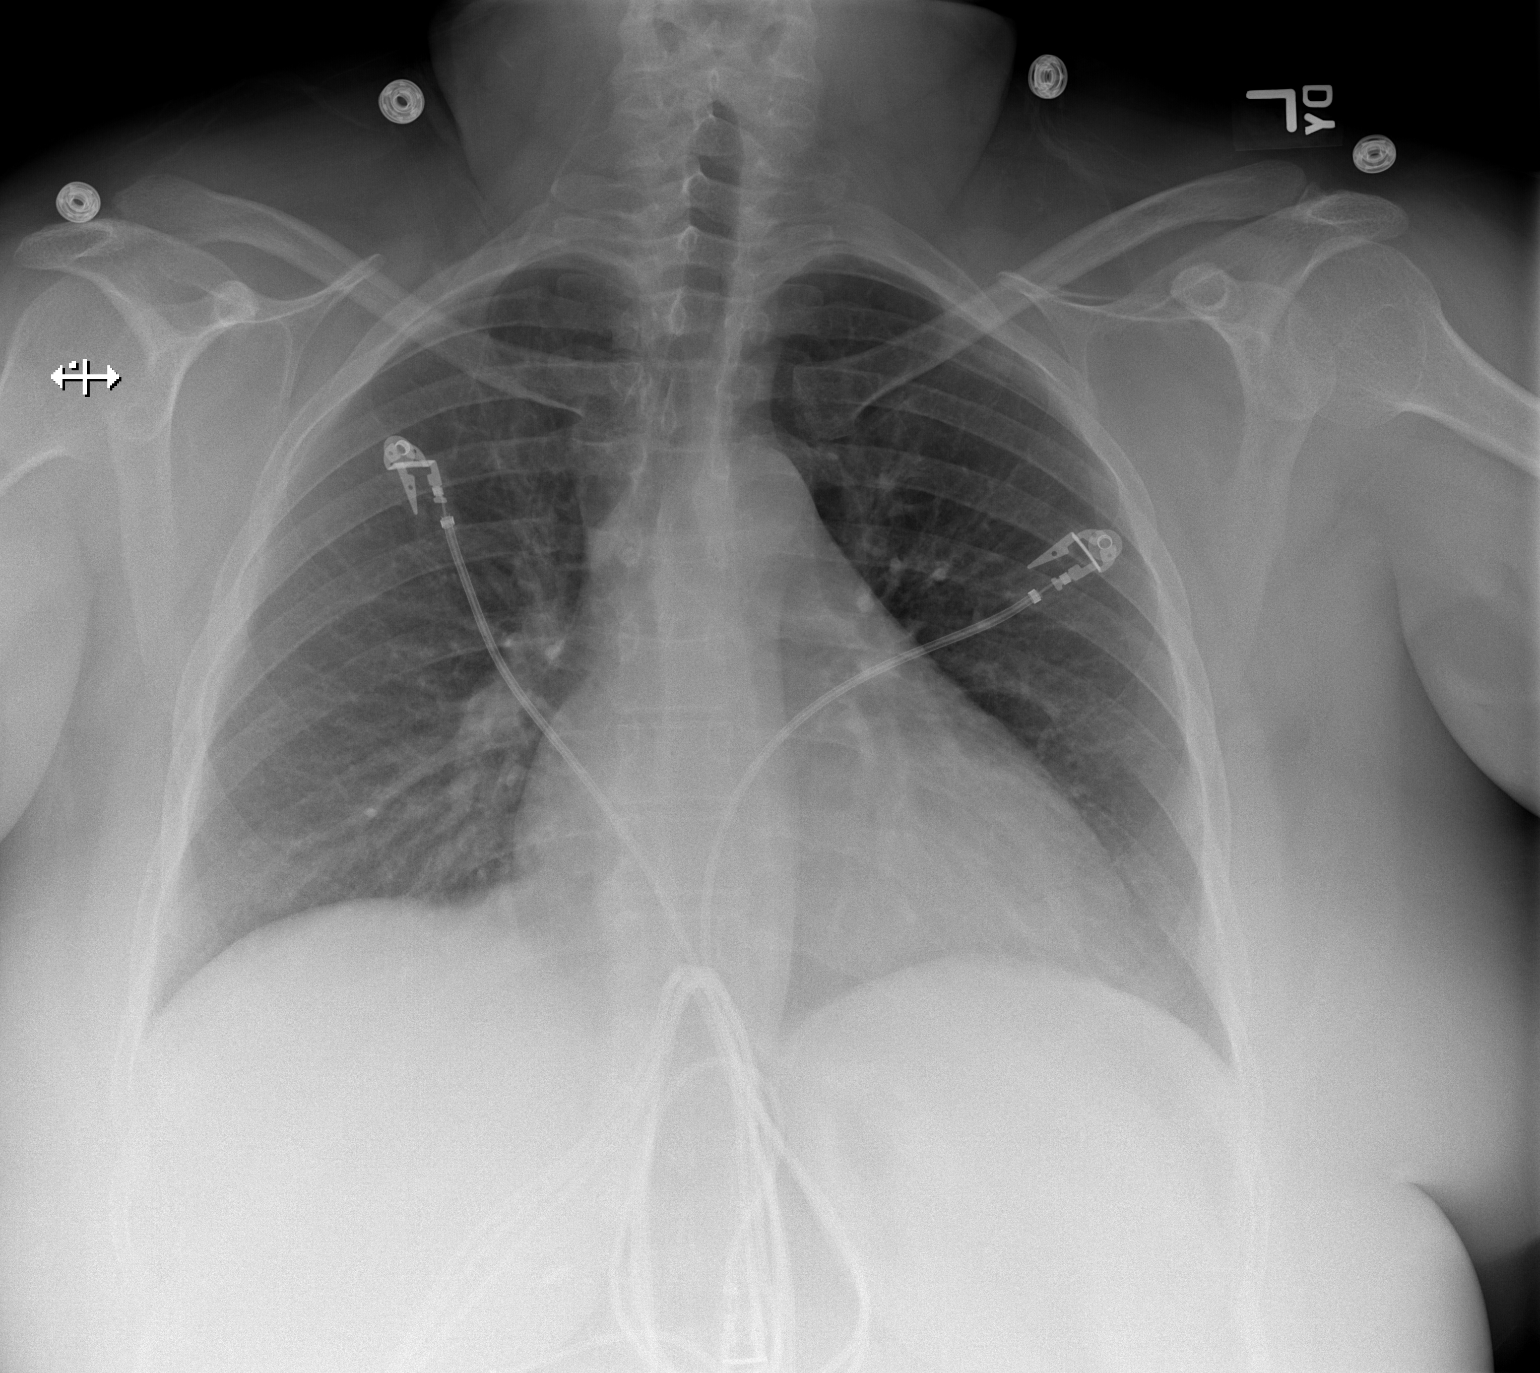
[im 2/2]
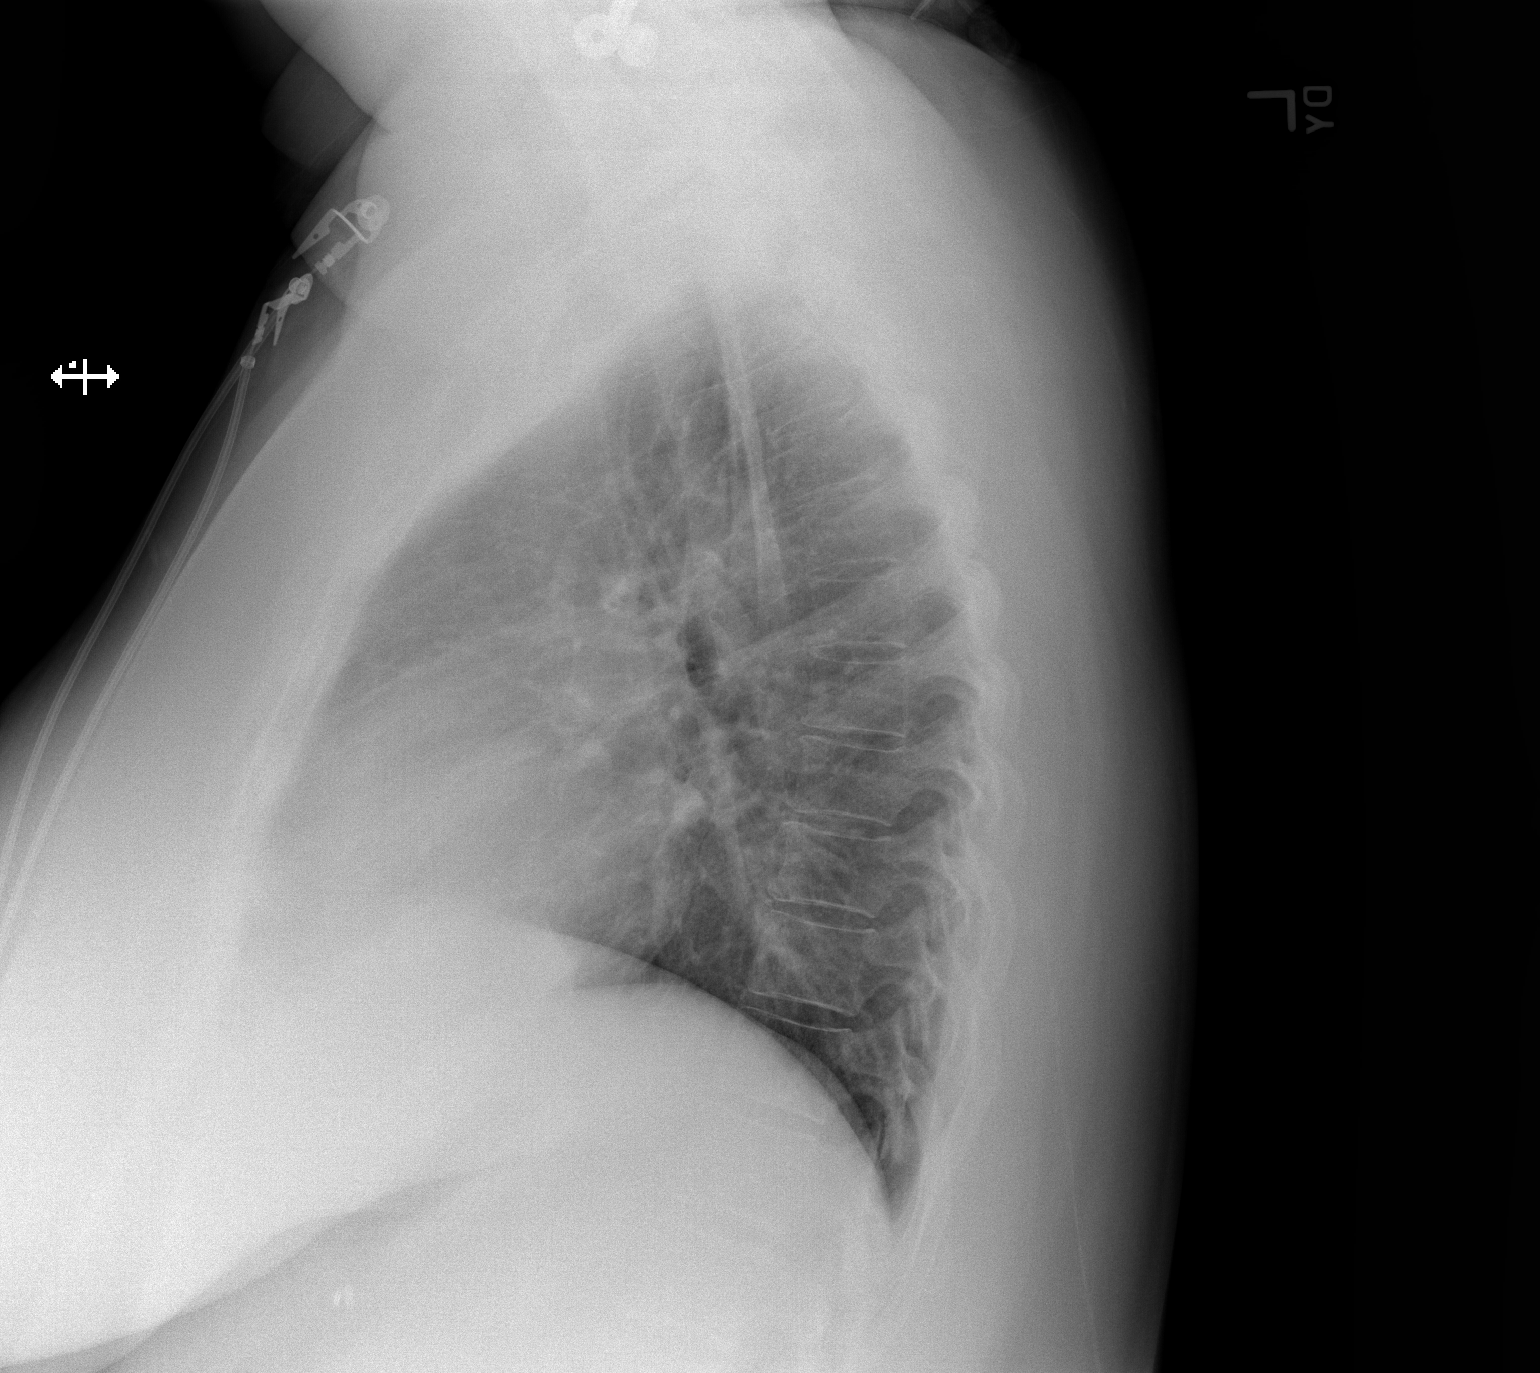

[2 of 2 positions shown; findings below may reference images not displayed]

PROCEDURE:     DXR - DXR CHEST PA (OR AP) AND LATERAL  - June 02, 2013 [DATE]

RESULT:     Comparison is made to the study May 31, 2013.

The lungs are slightly less well inflated. The cardiac silhouette remains
enlarged. The central pulmonary vascularity is more prominent today. There
is no pleural effusion or alveolar infiltrate. The bony thorax is normal in
appearance.
IMPRESSION: The findings suggest low-grade CHF. There is no focal
pneumonia.

[REDACTED]

## 2014-08-18 IMAGING — NM NM LUNG SCAN
2 series · 16 of 16 positions shown · non-contrast
Comparison: none

REASON FOR EXAM: PE, chest pain
COMMENTS:

[Series 1000: lung perfusion · 1.95mm/px · 4 acquisitions, 8 frames shown]
[im 1/4]
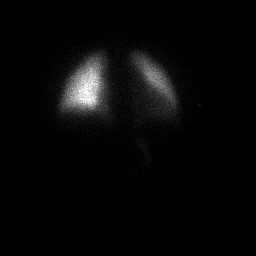
[im 1/4]
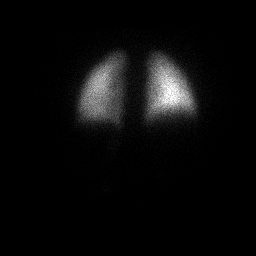
[im 2/4]
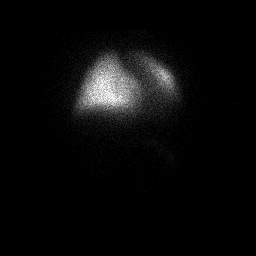
[im 2/4]
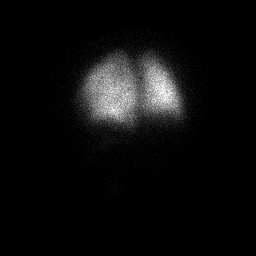
[im 3/4]
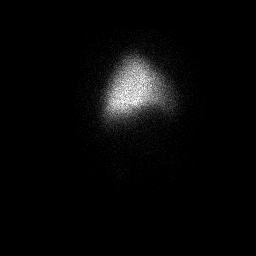
[im 3/4]
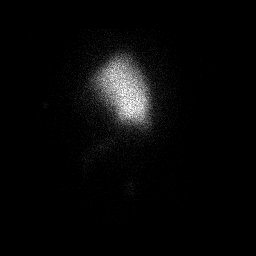
[im 4/4]
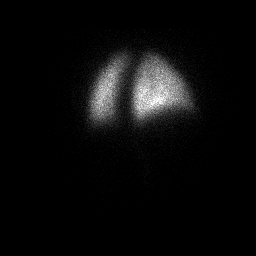
[im 4/4]
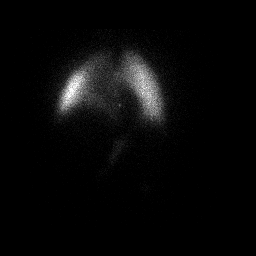

[Series 1000: lung ventilation · 3.90mm/px · 4 acquisitions, 8 frames shown]
[im 1/4]
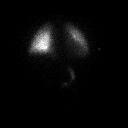
[im 1/4]
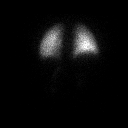
[im 2/4]
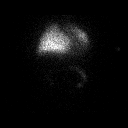
[im 2/4]
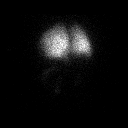
[im 3/4]
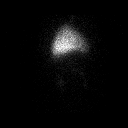
[im 3/4]
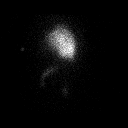
[im 4/4]
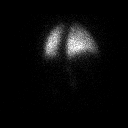
[im 4/4]
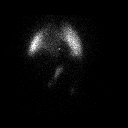

[16 of 16 positions shown; findings below may reference images not displayed]

PROCEDURE:     NM  - NM VQ LUNG SCAN  - [DATE] [DATE] [DATE] [DATE]

RESULT:     The patient received 41.294 mCi of technetium 99m labeled DTPA
via nebulizer for the ventilation study. The patient received 4.393 mCi of
technetium 99m labeled MAA intravenously for the perfusion study. Comparison
is made to the chest x-ray of today's date.

There is symmetric deposition of the radiopharmaceutical within both lungs
on the ventilation study. On the perfusion study there is uniform
distribution of the radiopharmaceutical as well. There are no findings to
suggest ventilation perfusion mismatches.
IMPRESSION: Normal ventilation/perfusion lung scan.

[REDACTED]

## 2014-08-27 IMAGING — US ULTRASOUND RIGHT BREAST
1 series · 13 of 20 positions shown · non-contrast
Comparison: 08/30/2012.

REASON FOR EXAM: av rt mass and small asymmetry
COMMENTS:

PROCEDURE:     US  - US BREAST RIGHT  - September 07, 2012  [DATE]
RESULT:

[Series 1: ultrasound right breast · 0.08mm/px · 13 of 20 slices shown]
[im 1/20]
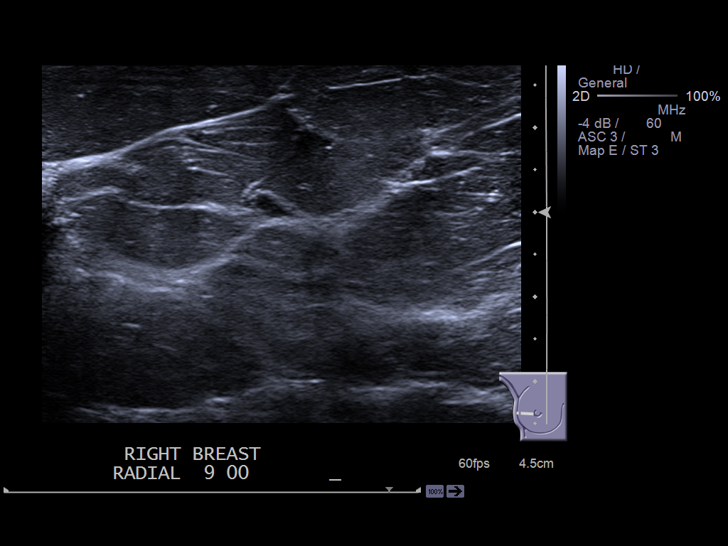
[im 3/20]
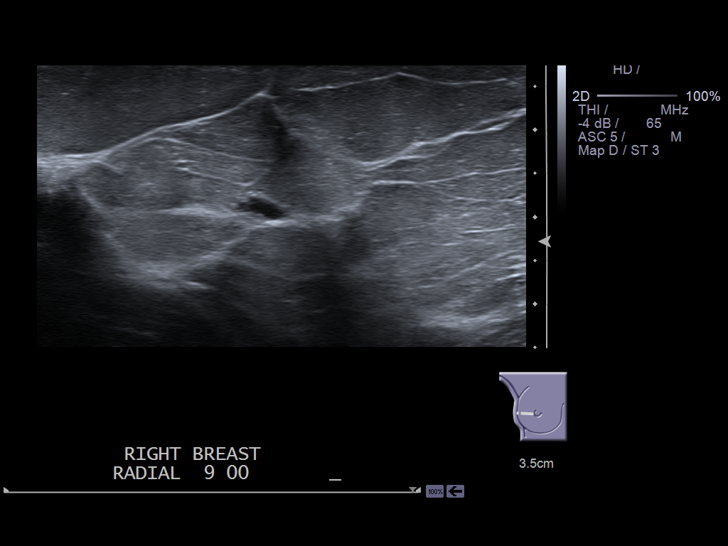
[im 4/20]
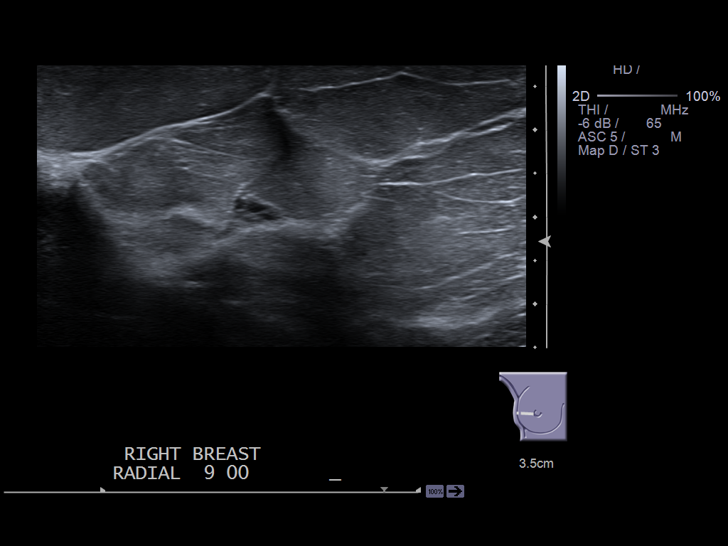
[im 6/20]
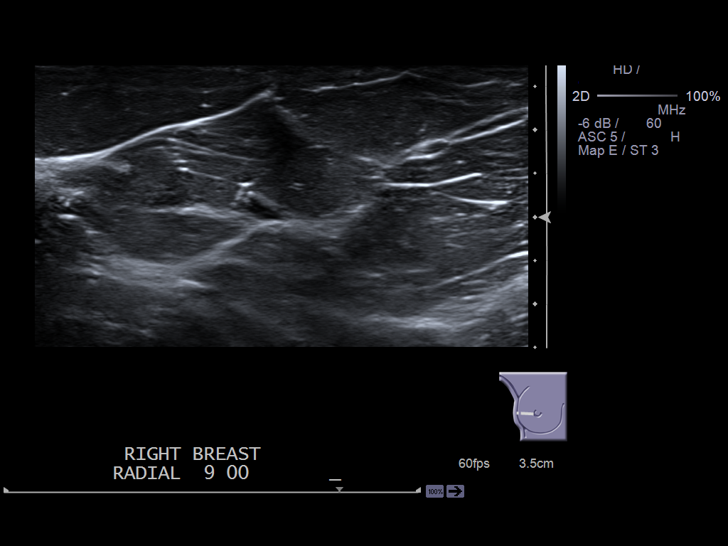
[im 7/20]
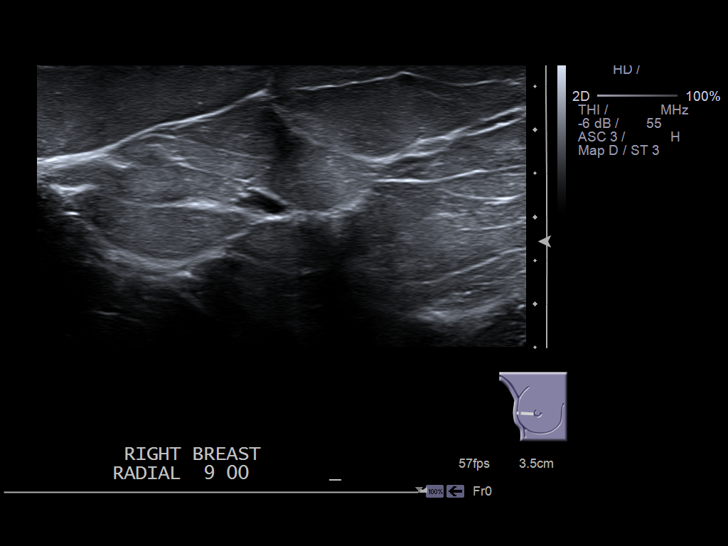
[im 9/20]
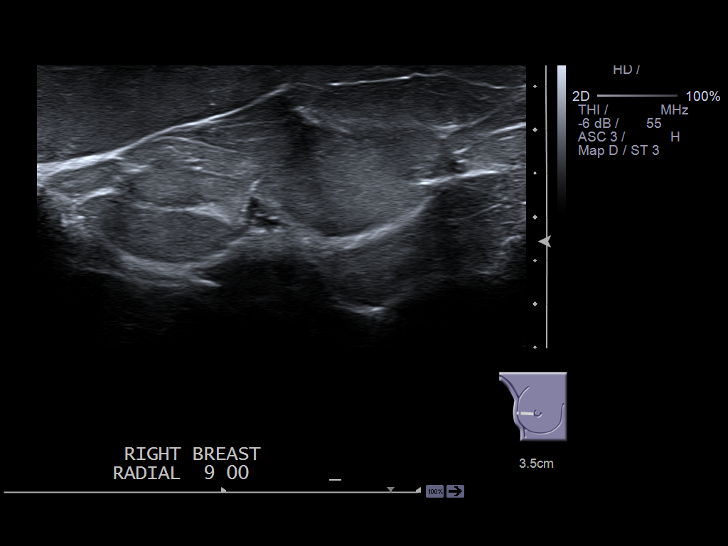
[im 11/20]
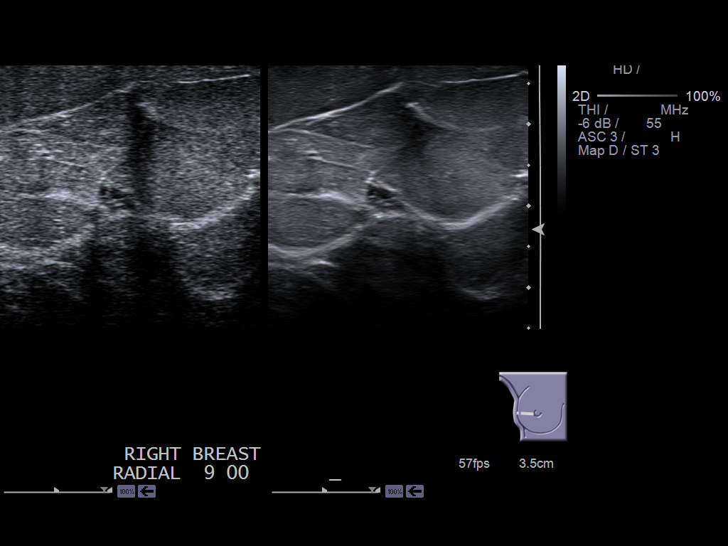
[im 12/20]
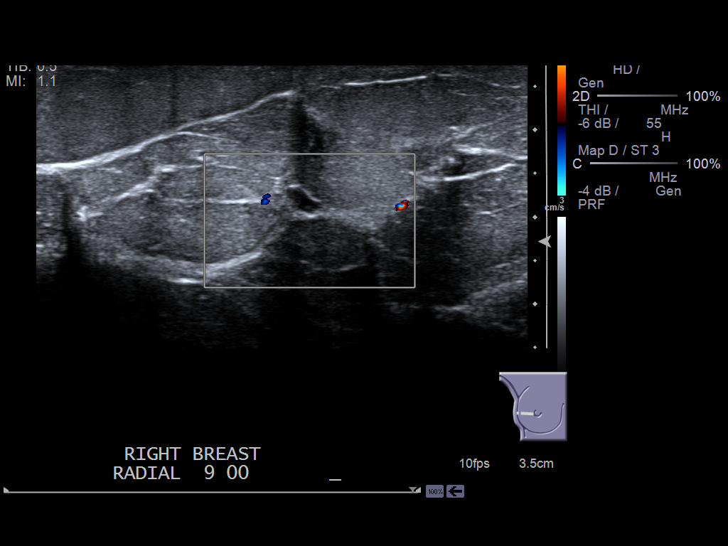
[im 14/20]
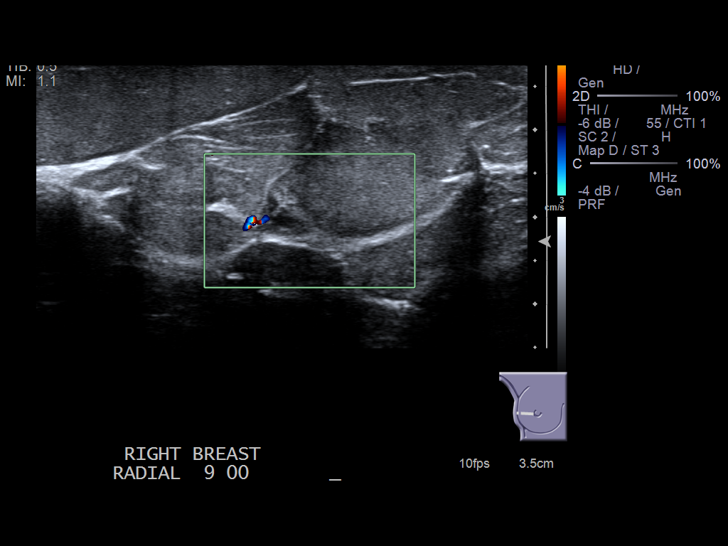
[im 15/20]
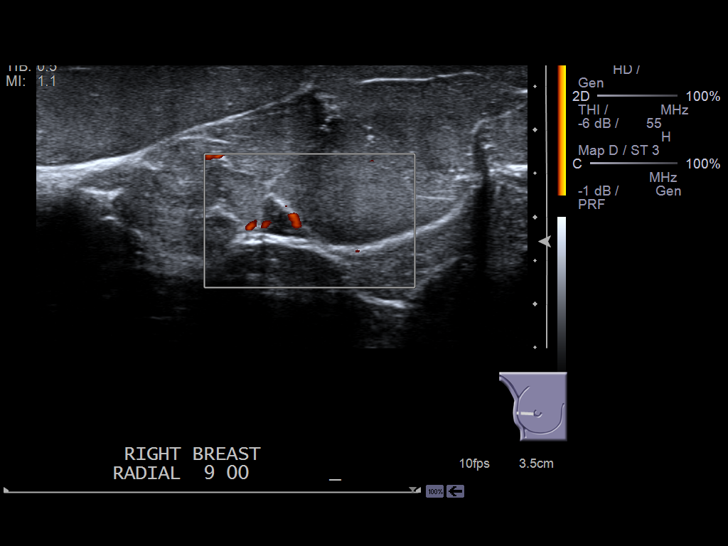
[im 17/20]
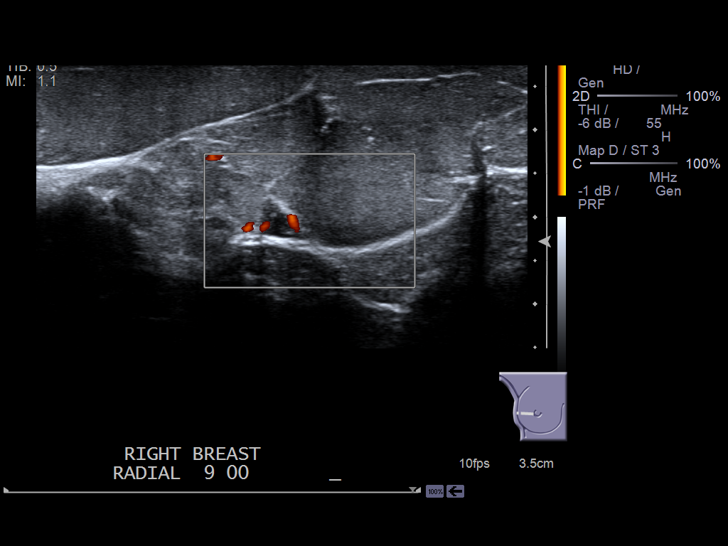
[im 18/20]
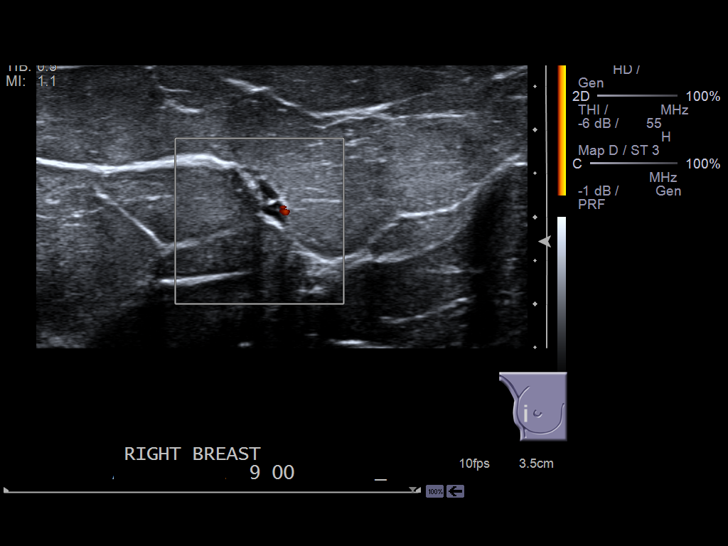
[im 20/20]
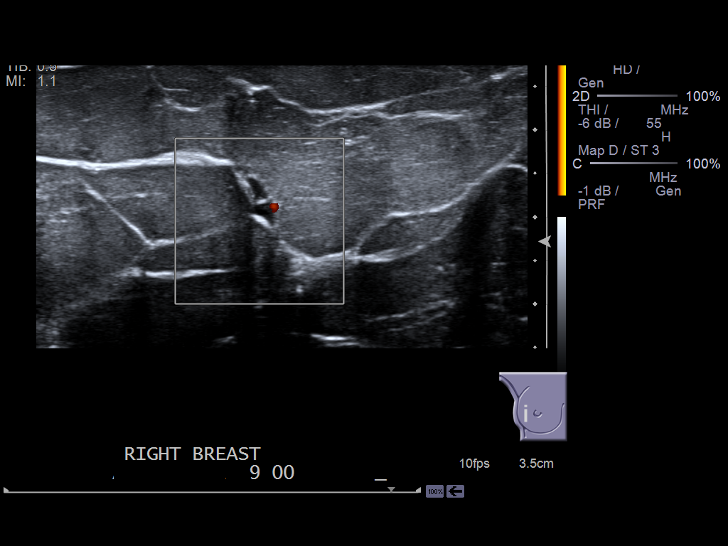

[13 of 20 positions shown; findings below may reference images not displayed]

FINDINGS: True lateral and spot compression magnification views were performed to
evaluate the findings in the right breast on the screening mammograms. The
spot compression magnification views demonstrate a round well-circumscribed
mass in the lateral aspect of the right breast at approximately [DATE]. It
measures approximately 8 mm in greatest dimension.

The other small asymmetry in the right MLO view effaced and assumed the
appearance of normal fibroglandular tissue.

Real time ultrasound was performed of the lateral right breast from [DATE]
through [DATE]. This revealed a small mass at [DATE]. On some images, the mass
is anechoic while on other images it is hypoechoic. On some images it is
wider than tall although on other images it is taller than wide. There is
suggestion of posterior acoustic through transmission. This may represent a
complicated cyst; however, a small solid mass cannot be excluded. It
measures 0.6 x 0.5 x 0.1 cm. No other mass was identified.
IMPRESSION: BI-RADS: Category 4 - Suspicious Abnormality.

There is a small mass in the right breast at [DATE] which likely represents a
complicated cyst. However, a solid mass cannot be excluded by these
ultrasound images. Attempt at cyst aspiration is recommended to ensure a
cystic nature, with potential for biopsy if solid.

## 2014-09-02 ENCOUNTER — Observation Stay: Payer: Self-pay | Admitting: Internal Medicine

## 2014-09-02 LAB — CBC
HCT: 36.3 % (ref 35.0–47.0)
HGB: 11.6 g/dL — AB (ref 12.0–16.0)
MCH: 25.7 pg — AB (ref 26.0–34.0)
MCHC: 32 g/dL (ref 32.0–36.0)
MCV: 80 fL (ref 80–100)
Platelet: 296 10*3/uL (ref 150–440)
RBC: 4.51 10*6/uL (ref 3.80–5.20)
RDW: 15.9 % — ABNORMAL HIGH (ref 11.5–14.5)
WBC: 10.3 10*3/uL (ref 3.6–11.0)

## 2014-09-02 LAB — BASIC METABOLIC PANEL
Anion Gap: 9 (ref 7–16)
BUN: 14 mg/dL (ref 7–18)
CO2: 24 mmol/L (ref 21–32)
CREATININE: 1.11 mg/dL (ref 0.60–1.30)
Calcium, Total: 8.8 mg/dL (ref 8.5–10.1)
Chloride: 107 mmol/L (ref 98–107)
EGFR (African American): >60
GFR CALC NON AF AMER: 56 — AB
GLUCOSE: 144 mg/dL — AB (ref 65–99)
OSMOLALITY: 282 (ref 275–301)
Potassium: 3.6 mmol/L (ref 3.5–5.1)
SODIUM: 140 mmol/L (ref 136–145)

## 2014-09-02 LAB — URINALYSIS, COMPLETE
Bilirubin,UR: NEGATIVE
Blood: NEGATIVE
GLUCOSE, UR: NEGATIVE mg/dL (ref 0–75)
Ketone: NEGATIVE
LEUKOCYTE ESTERASE: NEGATIVE
NITRITE: NEGATIVE
PH: 6 (ref 4.5–8.0)
Protein: NEGATIVE
RBC,UR: 1 /HPF (ref 0–5)
Specific Gravity: 1.013 (ref 1.003–1.030)
Squamous Epithelial: <1
WBC UR: 2 /HPF (ref 0–5)

## 2014-09-02 LAB — TROPONIN I: Troponin-I: <0.02 ng/mL

## 2014-09-02 LAB — CK TOTAL AND CKMB (NOT AT ARMC)
CK, Total: 70 U/L (ref 26–192)
CK-MB: 0.6 ng/mL (ref 0.5–3.6)

## 2014-09-02 LAB — CK-MB: CK-MB: <0.5 ng/mL — ABNORMAL LOW (ref 0.5–3.6)

## 2014-09-03 LAB — LIPID PANEL
Cholesterol: 153 mg/dL (ref 0–200)
HDL Cholesterol: 32 mg/dL — ABNORMAL LOW (ref 40–60)
Ldl Cholesterol, Calc: 74 mg/dL (ref 0–100)
Triglycerides: 237 mg/dL — ABNORMAL HIGH (ref 0–200)
VLDL CHOLESTEROL, CALC: 47 mg/dL — AB (ref 5–40)

## 2014-09-03 LAB — TROPONIN I

## 2014-09-03 LAB — HEMOGLOBIN A1C: Hemoglobin A1C: 6.5 % — ABNORMAL HIGH (ref 4.2–6.3)

## 2014-09-03 LAB — CK-MB

## 2014-09-28 IMAGING — CR DG CHEST 2V
1 series · 2 of 2 positions shown · non-contrast
Comparison: Chest radiograph performed 06/02/2013

CLINICAL DATA: Shortness of breath and cough. Fever.

EXAM:
CHEST  2 VIEW

[Series 1: w chest pa · 0.14mm/px · 2 of 2 slices shown]
[im 1/2]
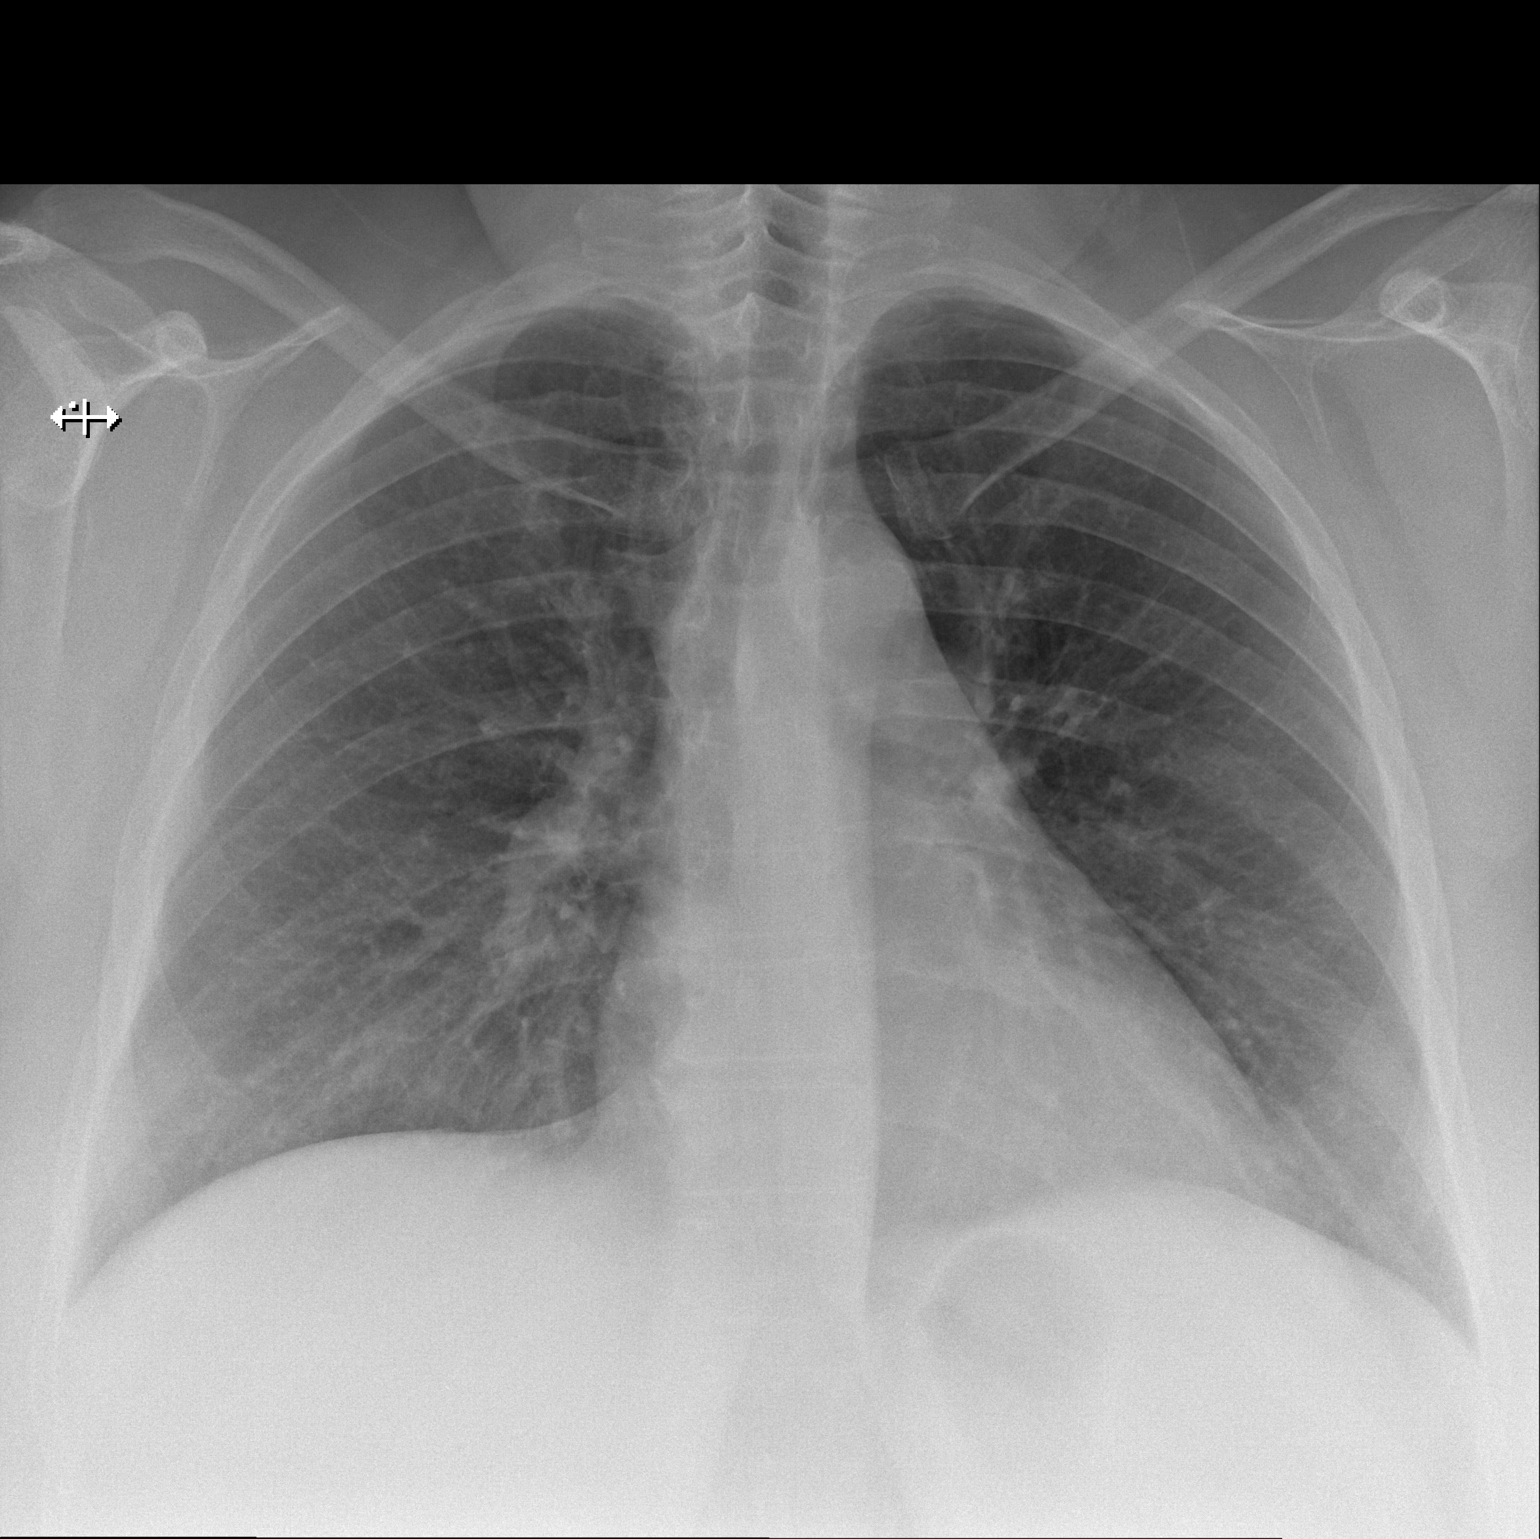
[im 2/2]
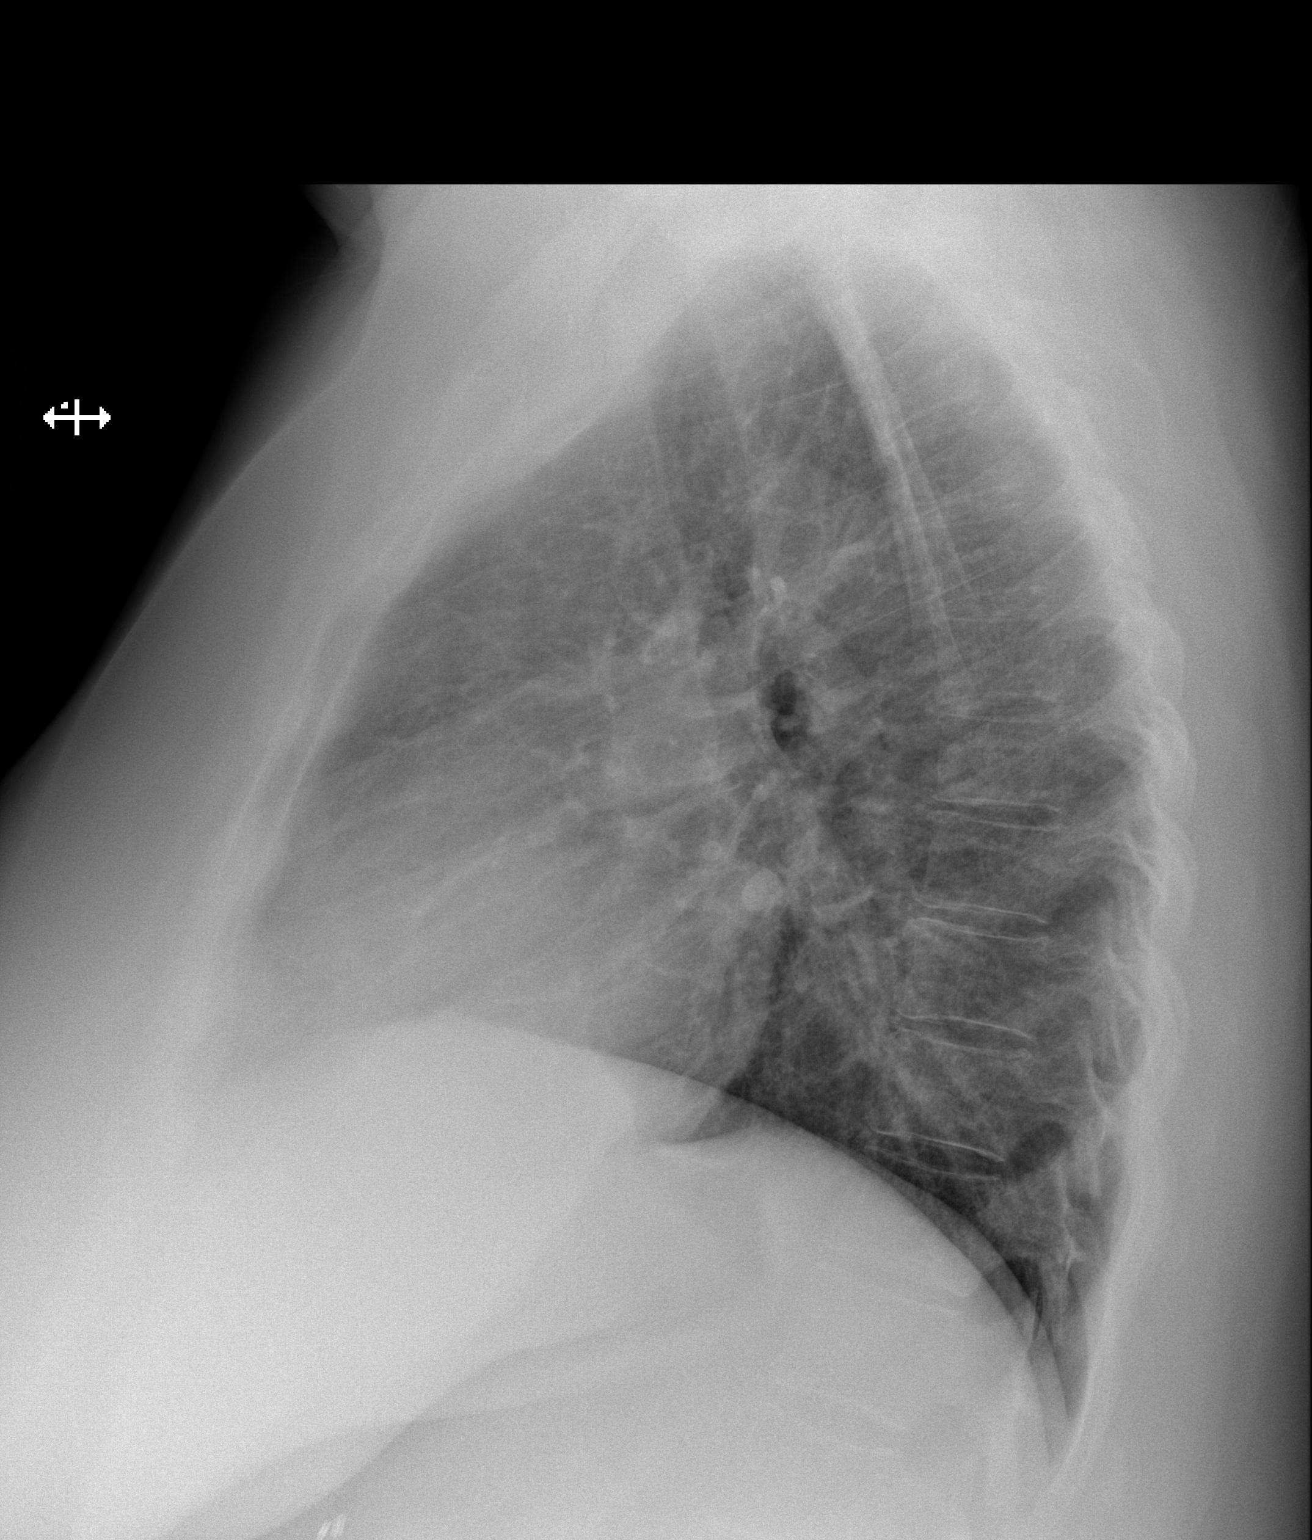

[2 of 2 positions shown; findings below may reference images not displayed]

FINDINGS: The lungs are well-aerated and clear. There is no evidence of focal
opacification, pleural effusion or pneumothorax.

The heart is normal in size; the mediastinal contour is within
normal limits. No acute osseous abnormalities are seen. Clips are
noted within the right upper quadrant, reflecting prior
cholecystectomy.
IMPRESSION: No active cardiopulmonary disease.

## 2014-10-03 ENCOUNTER — Emergency Department: Payer: Self-pay | Admitting: Emergency Medicine

## 2014-10-03 LAB — BASIC METABOLIC PANEL
ANION GAP: 5 — AB (ref 7–16)
BUN: 11 mg/dL (ref 7–18)
CALCIUM: 9 mg/dL (ref 8.5–10.1)
CHLORIDE: 106 mmol/L (ref 98–107)
Co2: 28 mmol/L (ref 21–32)
Creatinine: 1.03 mg/dL (ref 0.60–1.30)
EGFR (African American): >60
EGFR (Non-African Amer.): >60
Glucose: 118 mg/dL — ABNORMAL HIGH (ref 65–99)
OSMOLALITY: 278 (ref 275–301)
POTASSIUM: 3.9 mmol/L (ref 3.5–5.1)
Sodium: 139 mmol/L (ref 136–145)

## 2014-10-03 LAB — CBC
HCT: 34.9 % — ABNORMAL LOW (ref 35.0–47.0)
HGB: 11.7 g/dL — ABNORMAL LOW (ref 12.0–16.0)
MCH: 26.3 pg (ref 26.0–34.0)
MCHC: 33.6 g/dL (ref 32.0–36.0)
MCV: 78 fL — AB (ref 80–100)
PLATELETS: 273 10*3/uL (ref 150–440)
RBC: 4.46 10*6/uL (ref 3.80–5.20)
RDW: 16.4 % — AB (ref 11.5–14.5)
WBC: 10 10*3/uL (ref 3.6–11.0)

## 2014-10-03 LAB — TROPONIN I: Troponin-I: <0.02 ng/mL

## 2014-10-21 ENCOUNTER — Ambulatory Visit: Payer: Self-pay | Admitting: Internal Medicine

## 2014-10-21 HISTORY — PX: LEFT HEART CATH AND CORONARY ANGIOGRAPHY: CATH118249

## 2014-10-30 ENCOUNTER — Emergency Department: Payer: Self-pay | Admitting: Emergency Medicine

## 2014-12-08 ENCOUNTER — Emergency Department
Admit: 2014-12-08 | Disposition: A | Discharge status: Home / Self Care | Payer: Self-pay | Admitting: Emergency Medicine

## 2014-12-18 ENCOUNTER — Telehealth: Payer: Self-pay | Admitting: *Deleted

## 2014-12-18 ENCOUNTER — Ambulatory Visit: Payer: Managed Care, Other (non HMO) | Admitting: General Surgery

## 2014-12-18 NOTE — Telephone Encounter (Signed)
Left message for pt to call back regarding where she had her mammogram at.

## 2014-12-23 ENCOUNTER — Ambulatory Visit
Admit: 2014-12-23 | Disposition: A | Discharge status: Home / Self Care | Payer: Self-pay | Attending: Family Medicine | Admitting: Family Medicine

## 2014-12-23 NOTE — Consult Note (Signed)
Brief Consult Note: Diagnosis: Hemorrhoidal bleeding.   Patient was seen by consultant.   Consult note dictated.   Comments: Patient with intermittent bright red blood per rectum most likely secondary to hemorrhoids. No evidence of diverticular bleed. One normal BM today with heme negative stools. H and H stable. History of colon polyps 10 years ago. History of diverticulosis and diverticulitis s/p surgery 4 years ago.  Recommendations: Regular diet. May use Anusol [facility] suppositories if further bleeding. Avoid antiplatelets and anticoagulants although they can be used if strongly indicated. Routine OP colonoscopy after further cardiac workup is done. Will follow.  Electronic Signatures: Jill Side (MD)  (Signed [phone] 10:33)  Authored: Brief Consult Note   Last Updated: 10-Nov-13 10:33 by Jill Side (MD)

## 2014-12-23 NOTE — Consult Note (Signed)
PATIENT NAME:  Brittany Nguyen, Brittany Nguyen MR#:  [id number] DATE OF BIRTH:  14-Apr-1966  DATE OF CONSULTATION:  07/15/2012  REFERRING PHYSICIAN:  Hospitalist Service  CONSULTING PHYSICIAN:  Jill Side, MD  REASON FOR CONSULTATION: Rectal bleeding.   HISTORY OF PRESENT ILLNESS: The patient is a 49 year old female with history of MI at age 50, restless leg syndrome, and recurrent chest pain. The patient was admitted again with chest pain that is worse with exertion and deep breaths. The patient also gives some history of rectal bleeding and, therefore, GI was consulted. The patient was seen this morning. According to her, she has had trouble with intermittent rectal bleeding for a while but she has been seeing blood more frequently within the last two weeks. According to her, she sees blood only when she wipes after having a bowel movement and the blood is bright red. Denies any change in bowel habits. Denies any diarrhea or constipation. Denies any blood mixed in the stool. According to her since admission yesterday, she has not had any further bleeding and, in fact, had a bowel movement this morning which was normal looking according to her. She has history of diverticulosis, diverticulitis, and had partial colectomy in 2008. According to her, her last colonoscopy was in 2002.   PAST MEDICAL HISTORY:  1. History of coronary artery disease status post MI at age 74.  2. Restless leg syndrome.  3. History of thyroid nodule. 4. Chronic iron deficiency anemia. 5. History of asthma.  6. Diverticulitis status post partial colectomy. 7. Cholecystectomy. 8. Cesarean section.  9. Hysterectomy.   ALLERGIES: Penicillin, codeine, contrast dye, and latex.   MEDICATIONS: Requip 2 mg once a day at bedtime.   SOCIAL HISTORY: Does not smoke or drink.   FAMILY HISTORY: Unremarkable.   REVIEW OF SYSTEMS: Negative except for what is mentioned in the history of present illness.   PHYSICAL EXAMINATION:   GENERAL:  Somewhat obese well built female, does not appear to be in any acute distress.   VITAL SIGNS: Temperature 97.9, pulse 79, respirations 16, blood pressure 109/76.   HEENT: Unremarkable.   NECK: Neck veins are flat. Clinically she does not appear to be severely anemic.   LUNGS: Clear to auscultation bilaterally with fair entry.   CARDIOVASCULAR: Regular rate and rhythm. No gallops or murmur.   ABDOMEN: Quite benign. Abdomen is soft, nontender, nondistended. No rebound or guarding was noted. No hepatosplenomegaly.   NEUROLOGIC: Appears to be unremarkable.   LABS: Hemoglobin on admission was 11.3. Repeat hemoglobin was 10.2, this morning 10.3. The rest of the CBC is unremarkable. Cardiac enzymes are normal. Electrolytes, BUN, and creatinine normal.   ASSESSMENT AND PLAN: The patient is with what appears to be hemorrhoidal bleeding. She is complaining of bright red blood on the toilet paper intermittently for a while. Stools were tested heme-negative and she had a normal bowel movement today without any blood. This does not appear to be consistent with diverticular bleeding and there are no signs of active GI blood loss and the patient's hemoglobin and hematocrit are stable. The patient was admitted with chest pain and cardiac work-up is underway. I would recommend using Anusol-HC suppositories in case of recurrent bleeding. The patient should have an outpatient colonoscopy as apparently some polyps were found on last colonoscopy which was done about 10 years ago.    I would recommend avoiding antiplatelets and anticoagulants, although if strongly indicated these agents can be used again as there are no signs of significant active  GI blood loss. Will follow.  ____________________________ Jill Side, MD si:drc D: 07/15/2012 16:08:25 ET T: 07/15/2012 16:26:36 ET JOB#: [id number]  cc: Jill Side, MD, <Dictator> Jill Side MD ELECTRONICALLY SIGNED 07/18/2012 9:48

## 2014-12-23 NOTE — Discharge Summary (Signed)
PATIENT NAME:  Brittany Nguyen, BEAZLEY MR#:  [id number] DATE OF BIRTH:  Mar 23, 1966  DATE OF ADMISSION:  07/14/2012 DATE OF DISCHARGE:  07/16/2012  PRIMARY CARE PHYSICIAN:  Dr. Kary Kos.  PRIMARY CARDIOLOGIST: Dr. Nehemiah Massed.   DISCHARGE DIAGNOSES:  1. Atypical chest pain. 2. Sleep apnea. 3. Migraine headache. 4. Hemorrhoids.  5. Mild diastolic congestive heart failure.  6. Restless leg syndrome.   CONSULTS:  1. Dr. Nehemiah Massed, cardiology. 2. Dr. Dionne Milo, gastroenterology.   IMAGING STUDIES: An echocardiogram which showed ejection fraction of 45 to 50% with mild diastolic dysfunction.   ADMITTING HISTORY AND PHYSICAL: Please see detailed history and physical dictated by Dr. Bridgette Habermann. In brief, a 49 year old obese female patient with history of coronary artery disease at the age of 73, restless leg syndrome and several admissions in the last several years for chest pains and negative stress test, presented with similar complaints again. The patient was admitted to the hospitalist service for further work-up and treatment.   HOSPITAL COURSE:  1. Chest pain. The patient was seen by Dr. Ubaldo Glassing of cardiology who suggested chest pain seems atypical. The patient had a CT scan previously for similar chest pain which was negative. The patient had a stress test which was negative. She had a recent sleep study which was positive for sleep apnea and with discussing with her primary cardiologist, Dr. Nehemiah Massed, who saw her in the hospital, he thought her chest pains are likely secondary from the significant sleep apnea she has and will need a CPAP as outpatient.  2. The patient had some mild bleeding on the tissue paper after bowel movements which were thought to be hemorrhoidal by Dr. Dionne Milo who suggested no anticoagulants at this time.   On the day of discharge, the patient has a temperature of 98.2, blood pressure 118/74, pulse 82, saturating 96% on room air and is being discharged home to follow up with Dr.  Nehemiah Massed with normal cardiac examination.   DISCHARGE MEDICATIONS:  1. Tramadol 100 mg oral every eight hours as needed for pain, 15 pills given.  2. Requip 2 mg oral once a day.  3. Lasix 20 mg oral once a day.   DISCHARGE INSTRUCTIONS:  1. Follow up with Dr. Nehemiah Massed in a week. 2. CPAP follow up as outpatient.  3. The patient will need a beta blocker for her mild diastolic congestive heart failure as outpatient if her blood pressure can tolerate.  4. She will be on a low-salt, low-cholesterol diet.  5. Activity as tolerated.   This plan was discussed with the patient and her significant other at the bedside who have verbalized understanding and are okay with the plan.   TIME SPENT TODAY ON THIS DISCHARGE DICTATION ALONG WITH COORDINATING CARE AND COUNSELING OF THE PATIENT: 45 minutes.   ____________________________ Leia Alf Nyemah Watton, MD srs:ap D: 07/16/2012 14:29:04 ET T: 07/17/2012 11:46:52 ET JOB#: [id number]  cc: Alveta Heimlich R. Kingjames Coury, MD, <Dictator> Irven Easterly. Kary Kos, MD Corey Skains, MD  Neita Carp MD ELECTRONICALLY SIGNED 07/21/2012 9:00

## 2014-12-23 NOTE — Consult Note (Signed)
General Aspect 49 yo female with history of hyperlipidemia, mild lv dysfunction with ef of 40% by recent echo, history of dvt treated with coumadin in 1994, admitted with intermitant chest pian which she states has worsened in the last few days. Has had atypical chest pain in the past and noninvasive workup has not revealed evidence of ischemia. She is being worked up for sleep apnea as outpatient and had a sleep study late last week. Results are pending. She states the pain occurs with activity or with deep breathing. She denies pain at reset. Risk factors include family history and hyperlipidemia. She denies diabetes, hypertension, tobacco abuse. Pt has had a recent admission for similar complaints.   Physical Exam:   GEN no acute distress, obese    HEENT PERRL, hearing intact to voice    NECK supple    RESP normal resp effort  clear BS    CARD Regular rate and rhythm  Murmur    Murmur Systolic    Systolic Murmur axilla  3-2/3    ABD denies tenderness  normal BS  no Abdominal Bruits    LYMPH negative neck, negative axillae    EXTR negative cyanosis/clubbing, negative edema    SKIN normal to palpation    NEURO cranial nerves intact    PSYCH A+O to time, place, person, anxious   Review of Systems:   Subjective/Chief Complaint chest pain    General: No Complaints    Skin: No Complaints    ENT: No Complaints    Eyes: No Complaints    Neck: No Complaints    Respiratory: No Complaints    Cardiovascular: Chest pain or discomfort    Gastrointestinal: No Complaints    Genitourinary: No Complaints    Vascular: No Complaints    Musculoskeletal: No Complaints    Neurologic: No Complaints    Hematologic: No Complaints    Endocrine: No Complaints    Psychiatric: No Complaints    Review of Systems: All other systems were reviewed and found to be negative    Medications/Allergies Reviewed Medications/Allergies reviewed     Restless Leg Syndrome:    MI:     diverticulitis:    asthma:    migraines:    appendectomy:    Colon Resection:    C section:    thyroid biopsy:    hysterectomy:    Cholecystectomy:   EKG:   EKG NSR    Abnormal NSSTTW changes    Penicillin: GI Distress  Codeine: GI Distress  Betadine: Hives  Latex: Other  Contrast dye: Itching, Anaphylaxis    Impression Pt with history of hyperlipidemia, 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 She was admitted iwth recurrent chest pain with atypical features. She has been admitted recently with similar complaints. She has ruled out for an mi and her ekg ic unchanged form baseline. She is stable. Pain is exacerbated by getting up and moving slowly around her room as well as will deep inspiration. Etiology is unclear. Echo is pending. Will continue to monitor ekg with chest pain and review echo when avaiilable. Will obtain outpatient sleep apnea workup. Pt had a chest ct in may for similar complaints revealing no pulmonary embolism or other thoracic abnormality. Aortic root appearsd normal.    Plan 1. Continue with current meds 2. Will review outpatient sleep study when available done last thursday 3. Review echo 4. Careful ambulation. 5. Will discuss with patients  primary cardilogist in am regarding further noninvasive vs invasive cardia workup.   Electronic Signatures: Teodoro Spray (MD)  (Signed [phone] 09:39)  Authored: General Aspect/Present Illness, History and Physical Exam, Review of System, Past Medical History, Home Medications, EKG , Allergies, Impression/Plan   Last Updated: 10-Nov-13 09:39 by Teodoro Spray (MD)

## 2014-12-23 NOTE — H&P (Signed)
PATIENT NAME:  Brittany Nguyen, Brittany Nguyen MR#:  [id number] DATE OF BIRTH:  21-Sep-1965  DATE OF ADMISSION:  07/14/2012  REFERRING PHYSICIAN: Dr. Renard Hamper    PCP: Dr. Kary Kos   CHIEF COMPLAINT: Chest pain.    CARDIOLOGIST: Dr. Nehemiah Massed    HISTORY OF PRESENT ILLNESS: The patient is a pleasant 49 year old obese Caucasian female with history of MI per patient at age 39, restless leg syndrome, and several admissions in the last several years for chest pains with negative stress tests, the last one being in March of this year with a negative stress test, who presents with chest pain. The patient says the chest pain started two days ago which is worse today. It's substernal, shoots to the left shoulder area. There are no alleviating or aggravating factors. The patient also has some occasional bloating. She has chronic dyspnea on exertion as well. She has been getting worked up as an outpatient. She has recently had a sleep study and is awaiting the results. This was ordered through her cardiologist, Dr. Nehemiah Massed. The patient has received some aspirin already. The patient also endorsed having blood in the stool for the past two weeks which is off and on when she wipes herself as well as what she observes in the commode and there are drops of bright red blood. She has history of diverticular bleed and partial colectomy per chart in 2010. She has some crampy abdominal pain in the left lower quadrant at times as well, none now. Hospitalist service was contacted for further evaluation and management.   PAST MEDICAL HISTORY:  1. History of heart disease and MI per patient at age 63. 2. Restless leg syndrome.   3. Thyroid nodule.  4. History of iron deficiency anemia. 5. History of asthma, not on any inhalers. 6. History of diverticulitis and diverticular bleed status post partial colectomy. 7. Cholecystectomy. 8. Cesarean section. 9. Hysterectomy.   ALLERGIES: Penicillin, codeine, contrast dye, latex.   MEDICATIONS:  Only takes Requip 2 mg once a day at bedtime.  SOCIAL HISTORY: No tobacco, alcohol, or drug use. Works in [employer] as a Freight forwarder.   FAMILY HISTORY: Mom died from heart attack at age 87. Father with obstructive sleep apnea as well as heart disease and had a pacemaker placed.   REVIEW OF SYSTEMS: CONSTITUTIONAL: No fever but has some fatigue. No weight changes. EYES: No blurry vision or double vision. ENT: No sore throat, tinnitus, or hearing loss. RESPIRATORY: Positive for cough and chronic dyspnea on exertion. No wheezing. No COPD. No history of obstructive sleep apnea. CARDIOVASCULAR: Chest pain as above. No orthopnea. Has some chronic lower extremity edema. No arrhythmia or palpitations. GI: No nausea, vomiting, or diarrhea. Abdominal pain in the left lower quadrant. History of diverticulitis and colon surgery as above. Bleeding in the stool off and on for two weeks when she wipes herself; also, blood in the commode. Last bleeding episode prior to this was 8 months ago. GU: No dysuria or hematuria. ENDOCRINE: No polyuria or nocturia. HEME/LYMPH: History of anemia with iron deficiency, bleeding as above. MUSCULOSKELETAL: Denies arthritis. NEUROLOGIC: Denies weakness or numbness. No stroke or TIA. PSYCHIATRIC: No anxiety or insomnia.   PHYSICAL EXAMINATION:   VITAL SIGNS: Temperature on arrival 98, pulse rate initially 106, last one 91, respiratory rate 22, blood pressure 122/74 on arrival, oxygen sat 100% on room air, currently 97% on room air.   GENERAL: The patient is a morbidly obese Caucasian female talking in full sentences in no obvious distress.  HEENT: Normocephalic, atraumatic. Pupils are equal and reactive. Anicteric sclerae. Moist mucous membranes.   NECK: Supple. No thyroid tenderness. No cervical lymphadenopathy.   CARDIOVASCULAR: S1, S2 regular rhythm. No murmurs, rubs, or gallops.   LUNGS: Clear to auscultation without wheezing or rhonchi.   ABDOMEN: Soft, nontender,  nondistended. Positive bowel sounds in all quadrants. No organomegaly could be appreciated.   EXTREMITIES: Minimum lower extremity edema. No calf tenderness in either lower extremity.   NEUROLOGICAL: Cranial nerves II through XII grossly intact. Strength is 5/5 in all extremities.   PSYCH: Awake, alert, oriented x3, somewhat anxious, cooperative.   LABS: Glucose 117. BNP 124. BUN 10, creatinine 0.99, sodium 140, potassium 3.5, serum CO2 26. Magnesium 1.9. LFTs alkaline phosphatase 139, total protein 8.3, otherwise within normal limits. Troponin negative x1. WBC 10.4, hemoglobin 11.3, was 10.9 on October 1st. Hematocrit is 35.1 today. MCV 76, platelets 287. INR is 1. PT 13.4. D-dimer is elevated at 0.75.   EKG sinus tachycardia on arrival, rate is 108. No acute ST elevations or depressions. Possible anterior infarct, age undetermined. Q waves possibly in III. No acute ST elevations or depressions.   Chest x-ray, PA and lateral, no acute disease.   ASSESSMENT AND PLAN: We have a 49 year old Caucasian female with history of diverticular bleed status post partial colectomy and multiple episodes of chest pain with some work-up including some stress tests which have been negative, the last one being earlier this year, who presents with another episode of chest pain for two days. Negative troponin. No significant ST elevations or depressions on the EKG. Of note, the patient has been getting worked up for possible obstructive sleep apnea as an outpatient. The patient also has lower GI bleed probably. At this point will admit the patient to the hospital. 1. In regards to the chest pain, she has received aspirin already but given her lower GI bleed I would defer on further anticoagulation including aspirin therapy. I would obtain a Cardiology consult. I would cycle the troponins and get an echocardiogram. She has had several stress tests which were negative, the last one being in March of this year, and at this  point I would think that she would probably benefit from a cath but I would defer to Cardiology. Also possible is a GI cause of her symptoms. The patient complains of bloating and crampy abdominal pain. She has no history of GERD. I would consider an EGD as well and start her on PPI and Maalox for now.  2. In regards to her chronic dyspnea on exertion, I would see what the echo shows. The patient possibly has pulmonary hypertension and obstructive sleep apnea. The patient has had a sleep study and we are waiting for results. The sleep study was done late last week. The patient does not appear to be in clinical CHF and her symptoms are all chronic. Although the D-dimer is slightly elevated, I do not think that this is acute PE. The patient had a similar presentation in March and had CT with PE protocol and lower extremity Doppler on the left which was negative. Also, given her acute GI bleed she would not be an optimal anticoagulation candidate. Would see what the echocardiogram shows as well as sleep study. The patient might benefit from a CPAP which I would order here.  3. Continue Requip for her restless leg syndrome.  4. The patient also complains of lower GI bleed. The patient has history of diverticular bleed and partial colectomy. I would  obtain a GI consult, trend the hemoglobins, and start her on some gentle fluids. I would hold NSAIDs as well as aspirin and start her on a PPI for now and start a clear diet and check hemoglobin every eight hours.  5. Start her on TEDs and SCDs for DVT prophylaxis.   CODE STATUS: The patient is FULL CODE.   TOTAL TIME SPENT: 60 minutes.   ____________________________ Vivien Presto, MD sa:drc D: 07/14/2012 18:58:19 ET T: 07/15/2012 07:46:55 ET JOB#: [id number]  cc: Vivien Presto, MD, <Dictator> Irven Easterly. Kary Kos, MD Corey Skains, MD Vivien Presto MD ELECTRONICALLY SIGNED 07/24/2012 13:32

## 2014-12-23 NOTE — Consult Note (Signed)
PATIENT NAME:  Brittany Nguyen, Brittany Nguyen MR#:  [id number] DATE OF BIRTH:  Jan 26, 1966  DATE OF CONSULTATION:  07/16/2012  REFERRING PHYSICIAN:   CONSULTING PHYSICIAN:  Corey Skains, MD  HISTORY OF PRESENT ILLNESS:  Brittany Nguyen is a 49 year old female who has had chest pain, shortness of breath, and has undergone a cardiac evaluation with stress test showing normal LV function with no evidence of myocardial ischemia, and an echocardiogram showing only a minimal amount of valvular heart disease. She also has had a normal EKG and with admission to the hospital has had no evidence of significant myocardial infarction or heart failure. She has had evaluation by sleep study showing sleep apnea, likely causing the symptoms that she is having at this time, and with no evidence of further significant symptoms may be discharged to home from the cardiac standpoint for further follow-up and adjustments of medications.    ____________________________ Corey Skains, MD bjk:[id number] D: 07/17/2012 14:22:57 ET T: 07/18/2012 09:44:28 ET JOB#: [id number]  cc: Corey Skains, MD, <Dictator> Corey Skains MD ELECTRONICALLY SIGNED 07/19/2012 8:12

## 2014-12-23 NOTE — Consult Note (Signed)
Chief Complaint:   Subjective/Chief Complaint No further bleeding. Had normal BM today. Will follow up as OP for OP colonoscopy. Will sign off. Thanks.   Electronic Signatures: Jill Side (MD)  (Signed 11-Nov-13 11:46)  Authored: Chief Complaint   Last Updated: 11-Nov-13 11:46 by Jill Side (MD)

## 2014-12-26 NOTE — Discharge Summary (Signed)
PATIENT NAME:  Brittany Nguyen, Brittany Nguyen MR#:  [id number] DATE OF BIRTH:  04/13/1966  DATE OF ADMISSION:  06/01/2013 DATE OF DISCHARGE:  06/02/2013  CONSULTANTS: Dr. Nehemiah Massed from Cardiology.   PCP: Dr. [name].   CHIEF COMPLAINT: Chest pain.   DISCHARGE DIAGNOSES: 1.  Atypical chest pain, resolved; unclear etiology, and the patient ruled out for acute coronary syndrome.  2.  History of chronic systolic congestive heart failure, with last ejection fraction of about 45% to 50%.  3.  History of obstructive sleep apnea.  4.  History of coronary artery disease.   DISCHARGE MEDICATIONS: Requip 2 mg once a day at bedtime, furosemide 20 mg daily, aspirin 81 mg daily, metoprolol tartrate 25 mg one-half tablet 2 times a day, pantoprazole 40 mg daily, Ativan 0.5 mg every 8 hours as needed for anxiety, tramadol 50 mg every 4 hours as needed for migraines.   DIET: Low sodium.   ACTIVITY: As tolerated.   FOLLOWUP: Please follow with PCP within 1 to 2 weeks. Please follow with your cardiology physician within the next week and get scheduled for echocardiogram for further evaluation, and if symptoms return, call your doctor and/or 911 right away, work on the diet and lose weight, as discussed.   DISPOSITION: Home.   CODE STATUS: PATIENT IS FULL CODE.   SIGNIFICANT LABS AND IMAGING: BUN on arrival 10, creatinine 1.13, sodium was 136, potassium 3.4.   Troponins were negative x 3.   CK-MB's were not elevated x 3. CK-MBs were not elevated x 3. HDL is 34. WBC 8.8. INR is one   X-ray of the chest, PA and lateral, on the 26th showing mild pulmonary cardiovascular effusion, cannot exclude a pericardial effusion as well. X-ray of the chest, PA and lateral, on September  on 28th, findings suggesting low-grade congestive heart failure, no focal pneumonia.   V/Q scan done on September 28th showed a normal ventilation/perfusion lung scan.   HISTORY OF PRESENT ILLNESS AND HOSPITAL COURSE: For full details of H  and P, please see the dictation on September 27th by Dr. Lavetta Nielsen, but briefly this is a 49 year old obese female with mild systolic chronic congestive heart failure, ejection fraction of 50%, obstructive sleep apnea on CPAP, who came in for some chest pain and pressure started acutely in the neck area, shooting down to the left side and then to the left arm. There is also some stinging sensation and she also told me that she has some tingling in her fingers on the left side. She has been having some shortness of breath and dyspnea on exertion chronically, but this was more so. She was admitted to the hospitalist service on telemetry.   She has had several hospitalizations for similar symptoms and she has had 2 stress tests in the past. Also had a CAT scan through that ruled out the last hospitalization earlier this year. She was admitted to the hospitalist service and cyclic cardiac markers were sent, which were all negative and ruled out acute coronary syndrome. A V/Q scan was ordered and as her symptoms were more chronic, with an acute component. She felt like she could not catch her breath. The V/Q scan was negative as well.   On further conversation she states that she is noncompliant with diet, has gained a lot of weight in the last few years. She stated that once she was diagnosed with CHF her dry weight was about 160 pounds or so. Currently, she is in excess of 200. She drinks about  6 servings of [employer] a day. In addition, she drinks a lot of free water. She appears to be in compensated chronic congestive heart failure, and she does not appear to be in acute congestive heart failure, but her lungs show some congestion. Diet and exercise were thoroughly encouraged, and the fact that she was drinking an excessive amount of fluids was also discussed in front of her as well as her family.    At this point she has no further chest pain, and she was seen by Dr. Nehemiah Massed from cardiology, and no further  invasive tests were ordered. She was recommended to follow up with cardiology and undergo an echocardiogram as an outpatient.   PHYSICAL EXAMINATION: VITAL SIGNS: On the day of discharge her vitals were as follows: Temperature 98.3, pulse 95, respiratory rate 18, blood pressure 102/69, oxygen saturation 96% on room air.  GENERAL: The patient is an obese Caucasian female, laying in bed in no obvious distress, talking in full sentences.  HEENT: Normocephalic, atraumatic. Moist mucous membranes.  NECK: Supple.  CARDIOVASCULAR: S1, S2 regular rate and rhythm. No murmurs, rubs or gallops.  LUNGS: Clear to auscultation, without significant wheezing or rhonchi.  ABDOMEN: Soft, nontender.  EXTREMITIES: Show no lower extremity edema.   At this point, we will discharge the patient. Of note, the patient did have some nausea, possibly from oxycodone that she was getting here.   Total time spent: 35 minutes.   THE PATIENT IS FULL CODE     ____________________________ Vivien Presto, MD sa:dm D: 06/02/2013 14:57:48 ET T: 06/02/2013 15:46:43 ET JOB#: [id number]  cc: Vivien Presto, MD, <Dictator> Irven Easterly. Kary Kos, MD Vivien Presto MD ELECTRONICALLY SIGNED 06/20/2013 14:09

## 2014-12-26 NOTE — H&P (Signed)
PATIENT NAME:  Brittany Nguyen, Brittany Nguyen MR#:  [id number] DATE OF BIRTH:  10/20/1965  DATE OF ADMISSION:  06/01/2013  REFERRING PHYSICIAN: Gretchen Nguyen. Brittany Arbour, MD  PRIMARY CARE PHYSICIAN: Brittany Nguyen. Brittany Kos, MD  CHIEF COMPLAINT: Chest pain.  HISTORY OF PRESENT ILLNESS: Brittany Nguyen is a 49 year old Caucasian female with past medical history of coronary artery disease, mild systolic congestive heart failure, ejection fraction 45% to 50%, obstructive sleep apnea, on CPAP, who is presenting with chest pain. She states that her chest pain began acutely, starting in her neck on the left side and then radiating to her left arm and finally her left chest. She described this as a stinging and pressure sensation, between 9 and 10 out of 10 in intensity. No worsening or relieving factors. She complains of associated dyspnea on exertion that has been chronic for approximately 1 year, but worsening over the last 2 days. She also states she has had lower extremity edema, but chronic over the same 1-year time period. She mentions orthopnea which is chronic for 1-year duration as well, which has been unchanged. Denies any PND. She states that she has gained approximately 5 pounds in the last 2 days, though is compliant with medications as well as diet restrictions. She also states that she has been having difficulty taking deep breaths, which has been present for about 2 days, but has been progressively worsening. She does have recent sick contacts. Approximately 1 week ago, her daughter was diagnosed with bronchitis, though she denies any URI-like symptoms in herself, including cough, congestion or nasal discharge. In the Emergency Department, her initial EKG and cardiac enzymes were within normal limits. Currently, she is complaining of chest pain, which is improved, though currently between 4 and 6 out of 10 in intensity.   REVIEW OF SYSTEMS:   CONSTITUTIONAL: Denies fever. Complains of generalized fatigue, weakness as well as 5-pound  weight gain in the last 2 days.  EYES: Denies any vision changes or eye pain.  ENT: Denies any ear pain or discharge or postnasal drip.  RESPIRATORY: Denies any cough or wheeze. Does mention dyspnea on exertion as mentioned above.  CARDIOVASCULAR: Chest pain as described above as well as edema. She denies any palpitations.  GASTROINTESTINAL: Denies nausea, vomiting, diarrhea, abdominal pain.  GENITOURINARY: Denies dysuria or hematuria.  ENDOCRINE: Denies any nocturia or thyroid problems.  HEMATOLOGIC AND LYMPHATIC: Denies easy bruising or bleeding.  SKIN: Denies any rashes or lesions.  MUSCULOSKELETAL: Denies pain in neck, back, shoulder or arms or arthritis or cramping.  NEUROLOGICAL: Denies any paralysis or paresthesias.  PSYCHIATRIC: Denies any anxiety or depressive symptoms.  Otherwise, full review of systems performed by me is negative.   PAST MEDICAL HISTORY:  1. Coronary artery disease.  2. Mild systolic congestive heart failure, ejection fraction 45% to 50%. 3. Obstructive sleep apnea, on CPAP.  CARDIAC HISTORY: She had a cardiac stress test performed back in April 2014 which was within normal limits. She had an echocardiogram done in November 2013 revealing ejection fraction of 45% to 50%, otherwise unremarkable.   SOCIAL HISTORY: She denies any alcohol, tobacco or drug usage. She is married.   FAMILY HISTORY: Mother died of a heart attack at age 19.   ALLERGIES: MULTIPLE ALLERGIES TO BETADINE, CODEINE, CONTRAST DYE, PENICILLIN AS WELL AS LATEX.   HOME MEDICATIONS: Include:  1. Acetaminophen/hydrocodone 325/5 mg 1 tab p.o. q.4 hours as needed for pain.  2. Aspirin 81 mg p.o. daily.  3. Ativan 0.5 mg p.o. q.8 hours as  needed for anxiety. 4. Lasix 20 mg p.o. daily.  5. Metoprolol 25 mg 1/2-tab p.o. b.i.d. 6. Pantoprazole 40 mg p.o. daily.   PHYSICAL EXAMINATION:  VITAL SIGNS: Temperature 98.1, heart rate 90, respirations 20, blood pressure 130/82, saturating 100% on room  air. Weight 90.7 kg, BMI of 33.1.  GENERAL: Well-nourished obese Caucasian female in mild distress secondary to pain. HEAD: Normocephalic, atraumatic.  EYES: Pupils equal, round and reactive to light as well as accommodation. Extraocular muscles intact. No scleral icterus.  MOUTH: Moist mucosal membranes. Dentition intact. No abscesses.  EARS, NOSE, THROAT: Throat is clear without exudate. No external lesions.  NECK: Supple. No thyromegaly. No lymphadenopathy. No nodules. No JVD.  PULMONARY: Clear to auscultation bilaterally. No wheezes, rales or rhonchi. No use of accessory muscles. Good air entry bilaterally.  CHEST: Tender to palpation on the left side, but states this is a different intensity and quality than her previous symptoms.  CARDIOVASCULAR: S1, S2, regular rate and rhythm. No murmurs, rubs or gallops. No edema. Pedal pulses 2+. GASTROINTESTINAL: Soft, nontender, nondistended. No masses. No hepatosplenomegaly. Positive bowel sounds.  MUSCULOSKELETAL: No swelling, clubbing, edema. Range of motion is full in all extremities.  NEUROLOGIC: Cranial nerves II through XII intact. No gross neurological deficits. Sensation intact. Reflexes intact.  SKIN: No ulcerations, lesions or rashes. Skin is warm and dry. Turgor intact.  PSYCHIATRIC: Mood and affect within normal limits. Alert and oriented x3. Insight and judgment intact.    LABORATORY DATA: Sodium 136, potassium 3.4, chloride 104, bicarbonate 26, BUN 10, creatinine 1.37, glucose 143. Troponin I less than 0.02, CK-MB is 0.5. WBC 8.8, hemoglobin 11.8, platelets 290. INR of 1.   CHEST X-RAY: No acute cardiopulmonary process.   ELECTROCARDIOGRAM: Normal sinus rhythm, heart rate of 90.   ASSESSMENT AND PLAN: A 49 year old Caucasian female with history of coronary artery disease, mild systolic congestive heart failure, ejection fraction 45% to 50%, who is presenting with chest pain. She also states having dyspnea on exertion which is chronic,  but worsening over the last 2 days, stating that she is unable to catch deep breath, finding it hard to fill her lungs.   1. Chest pain. Trend cardiac enzymes. Give aspirin and statin. Check lipids. Consult cardiology. Reproducible pain on palpation. Add ibuprofen. She has been given Lovenox. Will check a V/Q scan.  2. Systolic congestive heart failure. asymptomatic at this time, check echocardiogram if not recently performed, last documented was in November 2103. Continue Lasix as well as beta blockade. 3. Obstructive sleep apnea. CPAP at night.  4. Gastroesophageal reflux disease. Pantoprazole.  5. Deep vein thrombosis prophylaxis. Heparin subcutaneous.  CODE STATUS: Full code patient.   TIME SPENT: 45 minutes.   ____________________________ Aaron Mose. Hower, MD dkh:[id number] D: 06/01/2013 04:41:57 ET T: 06/01/2013 06:11:45 ET JOB#: [id number]  cc: Aaron Mose. Hower, MD, <Dictator> DAVID Woodfin Ganja MD ELECTRONICALLY SIGNED 06/01/2013 21:57

## 2014-12-26 NOTE — H&P (Signed)
PATIENT NAME:  Brittany Nguyen, Brittany Nguyen MR#:  [id number] DATE OF BIRTH:  Aug 21, 1966  DATE OF ADMISSION:  12/15/2012  PRIMARY CARE PHYSICIAN: Irven Easterly. Kary Kos, MD  REFERRING PHYSICIAN: Jon Gills. Reita Cliche, MD  CARDIOLOGY: Isaias Cowman, MD, Corey Skains, MD  CHIEF COMPLAINT: Chest pressure and shortness of breath.   HISTORY OF PRESENT ILLNESS: The patient is a 49 year old Caucasian female with a past medical history of GERD, obstructive sleep apnea, restless leg syndrome, congestive heart failure and history of iron deficiency anemia, who is presenting to the ER with a chief complaint of chest pressure associated with shortness of breath. The patient is reporting that at around 4:45 p.m., she suddenly started having midsternal chest pressure associated with shortness of breath, dizziness, nausea and vomiting. She was diaphoretic as well. As the patient was persistently having chest pressure without any relief, she was brought into the ER by the husband. When the patient was waiting in the waiting room, her chest pressure became very intense associated with shortness of breath, and at one point, she could not breathe. The patient was immediately wheeled into the ER. The patient had past medical history of DVT in the past in her lower extremity, and she was initially started on Coumadin and eventually discharged home with p.o. Plavix, as reported by the patient, and she has stopped taking Plavix, as recommended by Dr. Saralyn Pilar, approximately 2 years ago. She is admitting that she is under a lot of pressure related with her job, which is really demanding. Initial set of cardiac enzymes was negative, but as the patient is persistently complaining of chest pressure, she is started on heparin drip by the ER physician. V/Q scan is ordered, which is still pending at this time. The patient cannot get CT angiogram of the chest to rule out pulmonary embolism as SHE IS ALLERGIC TO CONTRAST DYE. The patient's husband is  at bedside, and he is endorsing that the patient is very anxious lately because of her stressful job. The patient sees Dr. Saralyn Pilar as an outpatient regarding her congestive heart failure, and she takes Lasix on a daily basis. The patient also has history of obstructive sleep apnea and uses CPAP q.h.s. In the ER, the patient has received 4 baby aspirins. Following that, the chest pressure was much better, as reported by the patient.   PAST MEDICAL HISTORY:  1. Obstructive sleep apnea, on CPAP q.h.s.  2. Congestive heart failure. 3. GERD. 4. Restless leg syndrome.  5. Thyroid nodule.  6. Asthma. 7. History of diverticulitis.  8. History of acute MI at age 8.   PAST SURGICAL HISTORY: Cholecystectomy, cesarean section, hysterectomy.   ALLERGIES: THE PATIENT IS ALLERGIC TO PENICILLIN, CODEINE, CONTRAST DYE, LATEX.   HOME MEDICATIONS:  1. Requip 2 mg once daily.  2. Ibuprofen 800 mg 3 times a day. 3. Lasix 20 mg once daily.   PSYCHOSOCIAL HISTORY: Lives at home with husband. Denies smoking, alcohol or illicit drug usage. Works as a Freight forwarder in [employer].   FAMILY HISTORY: Mom died from heart attack at age 62. Father with obstructive sleep apnea as well as heart disease and had a pacemaker.   REVIEW OF SYSTEMS:   CONSTITUTIONAL: Denies any fever, fatigue, weakness. Complaining of chest pressure. No weight loss or weight gain.  EYES: No blurry vision, glaucoma, cataracts.  ENT: No tinnitus, ear pain, discharge, postnasal drip or sinus pain.  RESPIRATORY: Has history of asthma. No COPD. Denies any cough. Complaining of chest pressure. Complaining of shortness  of breath.  CARDIOVASCULAR: Chest pressure, dizziness. No palpitations or syncope.  GASTROINTESTINAL: Complaining of nausea, vomiting, but denies any diarrhea. Has history of GERD.  GENITOURINARY: No dysuria or hematuria.  GYNECOLOGIC AND BREASTS: No breast mass or vaginal discharge.  ENDOCRINE: No polyuria, nocturia or thyroid  problems.  HEMATOLOGIC AND LYMPHATIC: No anemia or easy bruising.  INTEGUMENTARY: No acne, rash, lesions.  MUSCULOSKELETAL: No joint pain in the neck, back, shoulder, knee. Denies any history of gout. NEUROLOGIC: No vertigo, ataxia, dementia.  PSYCHIATRIC: Denies any history of ADD, OCD or bipolar disorder, but it looks like maybe the patient has history of anxiety.   PHYSICAL EXAMINATION:  VITAL SIGNS: Temperature 98.8, pulse 94, respirations 22, blood pressure 96/52, pulse oximetry 100% on 2 liters.  GENERAL APPEARANCE: Not in acute distress. Moderately built and obese.  HEENT: Normocephalic, atraumatic. Pupils are equally reacting to light and accommodation. No scleral icterus. No conjunctival injection. Extraocular movements are intact. No sinus tenderness. Moist mucous membranes. No postnasal drip. No pharyngeal exudates.  NECK: Supple. No JVD. No thyromegaly. No lymphadenopathy.  LUNGS: Clear to auscultation bilaterally. No accessory muscle use and no anterior chest wall tenderness on palpation.  CARDIAC: S1, S2 normal. Regular rate and rhythm. No murmurs. No gallops. No anterior chest wall tenderness on palpation. No edema.  GASTROINTESTINAL: Soft. Bowel sounds are positive in all 4 quadrants. Nontender, nondistended. No masses felt. No hepatosplenomegaly.  NEUROLOGIC: Awake, alert, oriented x3. Motor and sensory are grossly intact. Reflexes are 2+. Cranial nerves II through XII are intact.  MUSCULOSKELETAL: No joint effusion, tenderness or erythema.  SKIN: No rashes, lesions. Warm to touch. Normal turgor.  PSYCHIATRIC: Normal mood and affect.   LABORATORIES AND IMAGING STUDIES: V/Q scan is pending. A 12-lead EKG has revealed sinus tachycardia at 99 beats per minute, normal PR and QRS intervals, nonspecific ST-T wave changes. Glucose 134, BNP 182, BUN 12, creatinine 0.95, sodium 139, potassium 3.1, chloride 104, CO2 26, GFR greater than 50, anion gap 9, serum osmolality 279, calcium is  8.9. LFTs are within normal range. CPK 0.7, troponin less than 0.02, CK total 88. WBC 11.1, hemoglobin 11.6, hematocrit 35.1, platelet count 297. MCV is 77. PT 13.5, INR 1.0. Activated PTT 25.8. ABG: pH 7.58, pCO2 23, pO2 136, FiO2 28%, bicarbonate 21.6. Chest x-ray portable has revealed no acute cardiopulmonary disease. Bilateral lower extremity venous Dopplers: No DVT.   ASSESSMENT AND PLAN: A 49 year old female presenting to the Emergency Room with a chief complaint of chest pressure and shortness of breath. She also has history of deep vein thrombosis in the past and was treated.   1. Chest pressure secondary to unstable angina versus pulmonary embolism. The patient was started on heparin drip and will continue that. She will be on oxygen, nitroglycerin, aspirin, statin. Holding beta blocker in view of low blood pressure. Cardiology consult is placed to Dr. Nehemiah Massed. Will cycle cardiac biomarkers.  V/Q scan is pending. Rounding physician to follow up on the V/Q scan.  2. Chronic congestive heart failure, not volume overloaded. Will continue Lasix and statin. In view of low-normal blood pressure, beta blocker is on hold.  3. Gastroesophageal reflux disease. Will provide proton pump inhibitor.  4. Restless leg syndrome. Will resume Requip.  5. Obstructive sleep apnea. CPAP q.h.s.   6. Hypokalemia. Will replete.  7. History of asthma. Will provide inhalers on as-needed basis.  8. The patient is on heparin drip.   CODE STATUS: Full code.   The diagnosis and plan of  care was discussed in detail with the patient and her husband at bedside. They verbalized understanding of the plan.   TOTAL TIME SPENT ON ADMISSION: 50 minutes.  ____________________________ Nicholes Mango, MD ag:OSi D: 12/15/2012 01:39:27 ET T: 12/15/2012 12:39:46 ET JOB#: [id number]  cc: Nicholes Mango, MD, <Dictator> Corey Skains, MD Irven Easterly. Kary Kos, MD Nicholes Mango MD ELECTRONICALLY SIGNED 12/18/2012 6:01

## 2014-12-26 NOTE — Discharge Summary (Signed)
PATIENT NAME:  Brittany Nguyen, Brittany Nguyen MR#:  [id number] DATE OF BIRTH:  07/26/1966  DATE OF ADMISSION:  12/15/2012 DATE OF DISCHARGE:  12/18/2012  REASON FOR ADMISSION: Chest pain.   FINAL DIAGNOSES: 1.  Atypical chest pain.  2.  Gastroesophageal reflux disease possible esophageal spasm.  3.  Anxiety.  4.  Diastolic dysfunction, compensated.   DISPOSITION: Home.   MEDICATIONS AT DISCHARGE: Requip 2 mg once a day, furosemide 20 mg once a day, acetaminophen with hydrocodone every 4 hours since for pain, aspirin 81 mg once a day, metoprolol tartrate 12.5 mg twice daily. This is a new medication. Protonix 40 mg once a day, Ativan 0.5 mg every 8 hours as needed for anxiety.   FOLLOW UP AND REFERRALS:   1.  Dr. Vira Agar, gastrointestinal within a week.  This is for evaluations for possible esophageal spasm with his reflux to do a possible upper gastrointestinal endoscopy or manometry testing. 2.  Follow up in 1 to 2 weeks with Dr. Nehemiah Massed, cardiology. 3.  Follow up with [facility]. [name] at [facility] 1 to 2 weeks.   HOSPITAL COURSE: This patient is a very nice 49 year old female who has history of congestive heart failure, obstructive sleep apnea on CPAP, congestive heart failure is mostly diastolic and well compensated. She has GERD, restless leg syndrome, history of thyroid nodule, asthma, diverticulitis and a history of acute myocardial infarction at the age of 52. She also has been recently diagnosed with breast mass for which she has undergone several biopsies by Dr. Jamal Collin. She is still waiting for those results. The patient is admitted to the ER on 12/15/2012 and evaluated by the ER physician.  She reported the start of chest pressure and shortness of breath around 4:45 p.m. prior to admission and started having midsternal chest pressure associated with shortness of breath, dizziness, nausea and an episode of vomiting. She was diaphoretic and the pain was coming in and out persistently. She had no  relief until she was brought to the ER by the husband.  They also pointed out that she was not able to breathe due to intense pressure in the waiting room for which she was wheeled into the ER. The patient had a previous history of DVT of her lower extremity, for which she was initially on Coumadin and then she was switched to Plavix, but she stopped the Plavix 2 years as ago recommended by Dr. Saralyn Pilar. The patient had desaturation of oxygen due to the shortness of breath for which V-Q scan was ordered. The patient is allergic to dye; we decided to do a CT scan with allergy preparation and the patient was able to tolerate that.  Status of problems:   1.  Chest pressure with chest pain. She was admitted with initial diagnosis of possible unstable angina versus pulmonary embolism. This patient was started on a heparin drip. A V-Q scan was ordered as cardiac enzymes were ordered.  The cardiac enzymes were negative. Her EKG was unremarkable. Cardiology consultation was obtained by Dr. Clayborn Bigness. As far as ruling out the possibility of PE, it was ruled out with the use CT scan with IV contrast (the patient is allergic to dye), but with the preparation, she was able to tolerate it. She did develop symptoms related to shortness of breath and rash regardless to the preparation, but they were not severe. Heparin was stopped after 48 hours.  Cardiology did not think that this was cardiac. She had a stress test that showed mostly some  attenuation of the inferior face of the heart which is mostly related to diaphragmatic evaluation. I discussed the case plenty with Dr. Clayborn Bigness about this finding and he thinks that this patient's finding is absolutely normal. Was planning on do heart catheterization at some point but due to complications of the contrast and the atypical symptoms that she is having, he decided not to do that and just go to the Myoview test. Overall she was ruled out for chest pain.  Other possibilities of  chest pain.   A.  GERD with esophageal spasm as the patient has significant multiple episodes of chest pain associated with GERD and relieved by nitroglycerin.  We have recommended follow-up with a gastrointestinal doctor; Dr. Vira Agar was recommended that the patient also goes to see him anyway. The patient is getting a referral for that. Hopefully, she can get done an upper GI work-up with endoscopy and possible manometry.  The patient was discharged with general pain medication and a prescription for a PPI.   2.  Chronic congestive heart failure. It was very compensated. There were no signs of volume overload.  Her BNP was or remarkable and her CT scan of the chest did not show any alveolar infiltrates or signs of fluid in the lungs, so she just needs to continue her home medications.  She was put on beta blocker, metoprolol 12.5 mg twice daily.   3.  As far as her GERD, proton pump inhibitor was administered.  4.  Restless leg syndrome. Continue Requip.  The patient has been counseled to continue treatments for his sleep apnea with CPAP to prevent any complications regardless to increase blood pressure or worsening congestive heart failure.   5.  The patient had some hypokalemia was repleted.   6.  She has history of asthma, for what she is using inhalers on a regular basis, but there were no signs of exacerbation.  LABORATORY AND DIAGNOSTIC DATA:  As far as results Myoview results have been commented already.  Her BNP was only 182 and her blood sugars in between 130 to 199 but that was due to fluids with sugars. Her LDL cholesterol was 124 and her magnesium is 1.6 that was repleted. The patient has been advised to follow up with cardiology for cholesterol treatment. At this moment she was started on cholesterol medication based on preference of the patient. Troponins were negative. White count was slightly elevated at 13.9, but that was due to steroid use. Hemoglobin is 10.7, which is chronic.  She  has decreased MCV, MCH, MCHC. She was recommended to have an iron rich diet.  Her ABG at admission showed a pH of 7.5, pCO2 of 23 and HCO3 of 21.  This pattern is likely need to hyperventilation for which anxiety could be a good explanation of her symptoms. This therapy and commented with the patient and she agrees to follow up with GI and if there is no symptom relief or no findings, she will seek psychiatric evaluation as well as she has a very demanding job  for which she has been overall stressed out.  the patient has been given some time off.  TIME SPENT: I spent about 45 minutes with this discharge.     ____________________________ Kilbourne Sink, MD rsg:ct D: 12/19/2012 07:21:40 ET T: 12/19/2012 07:39:56 ET JOB#: [id number]  cc: Falcon Mesa Sink, MD, <Dictator> Olyn Landstrom America Brown MD ELECTRONICALLY SIGNED 12/21/2012 13:35

## 2014-12-26 NOTE — Consult Note (Signed)
PATIENT NAME:  Brittany Nguyen, Brittany Nguyen MR#:  [id number] DATE OF BIRTH:  1965-11-29  CARDIOLOGY CONSULTATION REPORT  DATE OF CONSULTATION:  05/02/2013  CONSULTING PHYSICIAN:  Dr. Bridgett Larsson.  REASON FOR CONSULTATION: A 49 year old female with LV systolic dysfunction, history of coronary artery disease with myocardial infarction, having acute chest and left arm discomfort consistent with possible unstable angina, with history of sleep apnea.   CHIEF COMPLAINT: "I have chest pain."   HISTORY OF PRESENT ILLNESS: This is a 49 year old female with known hypertension and sleep apnea, history of congestive heart failure with LV systolic dysfunction, having an acute episode of chest discomfort in her left upper chest radiating into her left arm, associated with shortness of breath. This is waxing and waning over a several-hour period prior to admission to the hospital, and has continued throughout the evening. The patient has had no evidence of myocardial infarction, with a normal troponin and a normal stress test in April of this year. The patient has had stable sleep apnea.   Remainder review of systems negative for vision change, ringing in the ears, hearing loss, cough, congestion, heartburn, nausea, vomiting, diarrhea, bloody stools, stomach pain, extremity pain, leg weakness, cramping in the buttocks, known blood clots, headaches, blackouts, dizzy spells, nosebleed, congestion, trouble swallowing, frequent urination, urination at night, muscle weakness, numbness, anxiety, depression, skin lesions, skin rashes.   PAST MEDICAL HISTORY: 1.  Mild LV dysfunction.  2.  Hypertension.  3.  Sleep apnea.   FAMILY HISTORY: No family members with early onset of cardiovascular disease or hypertension.   SOCIAL HISTORY: Currently denies alcohol or tobacco use.   SHE HAS ALLERGIES TO MEDICATIONS AS LISTED.   PHYSICAL EXAMINATION: VITAL SIGNS: Blood pressure 126/68 bilaterally, heart rate 72 upright, reclining, and  regular.  GENERAL: She is a well-appearing female in no acute distress.  HEENT: No icterus, thyromegaly, ulcers, hemorrhage, or xanthelasma.  CARDIOVASCULAR: Regular rate and rhythm. Normal S1 and S2 without murmur, gallop, or rub. PMI is diffuse. Carotid upstrokes normal, without bruit. Jugular venous pressure is normal.  LUNGS: Lungs are clear to auscultation, with normal respirations.  ABDOMEN: Soft, nontender, without hepatosplenomegaly or masses. Abdominal aorta is normal size, without bruit.  EXTREMITIES: 2+ radial, femoral, dorsal pedal pulses with no lower extremity edema, cyanosis, clubbing or ulcers.  NEUROLOGIC: She is oriented to time, place, and person, with normal mood and affect.   ASSESSMENT: A 49 year old female with hypertension, left ventricular dysfunction, sleep apnea, apparent previous coronary artery disease with acute onset of chest and arm discomfort, without evidence of myocardial infarction.   RECOMMENDATIONS: 1.  Continue serial electrocardiograms and enzymes to assess for possible myocardial infarction. 2. Consider echocardiogram for left ventricular systolic dysfunction, valvular heart disease contributing to above.  3.  Further investigation of pulmonary causes of current symptoms.  4.  No further cardiac diagnostics at this time.  5. Ambulate and follow for any further significant symptoms and further intervention as necessary thereafter.     ____________________________ Corey Skains, MD bjk:dm D: 06/02/2013 08:29:36 ET T: 06/02/2013 09:15:53 ET JOB#: [id number]  cc: Corey Skains, MD, <Dictator> Corey Skains MD ELECTRONICALLY SIGNED 06/04/2013 10:34

## 2014-12-26 NOTE — Consult Note (Signed)
DATE OF BIRTH:  [birth year]  DATE OF CONSULTATION:  12/15/2012  REFERRING PHYSICIAN:  Dr. Kary Kos   CARDIOLOGIST:  Dr. Saralyn Pilar, she has also seen Dr. Nehemiah Massed in the past  CONSULTING PHYSICIAN:  Dwayne D. Callwood, MD  CHIEF COMPLAINT:  Chest pain, leg pain, shortness of breath.  HISTORY OF PRESENT ILLNESS:  The patient is a 49 year old, obese white female with GERD, obstructive sleep apnea, restless legs syndrome, who reportedly had congestive heart failure with a history of iron deficiency anemia. Presented in the ER with chief complaint  of chest pressure and shortness of breath. She reportedly presented around 5:00, suddenly started having midsternal chest pressure associated with shortness of breath, dizziness, nausea and vomiting. She was diaphoretic, as well. The patient was persistently having chest pressure without any relief, was brought to the Emergency Room by her husband. When the patient was waiting in the waiting room, her chest pressure became intense associated with shortness of breath. At some point, she had trouble breathing. This was immediately wheeled to the ER. She has a past history of DVT. She was initially started on Coumadin, eventually discharged home with p.o. Plavix. Reportedly, per the patient, she just stopped taking her Plavix and recommended by Dr. Saralyn Pilar  admitted under a lot of pressure related to her job, which has become demanding. Her cardiac workup was essentially negative, with troponins and EKG. Placed on a heparin drip. Scheduled a V/Q scan. She is ALLERGIC TO CONTRAST, but because of the chest pressure, she is being evaluated for further evaluation and care. The patient has a history of obstructive sleep apnea and is on CPAP, as well. Again, reportedly has a history of a DVT.   REVIEW OF SYSTEMS:  No black-out spells, syncope. No nausea. No vomiting. No fever, no chills, no sweats. No weight loss. No weight gain. No hemoptysis or hematemesis. No bright  red blood per rectum. No vision change. No hearing change. Denies sputum production and cough.   PAST MEDICAL HISTORY: Obstructive sleep apnea, congestive heart failure, GERD, restless legs syndrome, thyroid nodule, asthma, diverticulitis, reportedly had acute myocardial infarction at 38.   PAST SURGICAL HISTORY:  Cholecystectomy, cesarean section, hysterectomy.   ALLERGIES:  PENICILLIN, CODEINE, IV DYE, LATEX.  HOME MEDICATIONS:  Requip 2 mg once a day, ibuprofen 800 three times daily, Lasix 20 a day.   SOCIAL HISTORY:  Lives with her husband. No drugs. No alcohol consumption. Works as a Freight forwarder in [employer].   FAMILY HISTORY:  Mother died of a heart attack. Father has obstructive sleep apnea, sick sinus syndrome with a pacemaker.   PHYSICAL EXAMINATION: VITAL SIGNS:  Blood pressure initially was 100/50, pulse 90, respiratory rate 18, afebrile.  HEENT:  Normocephalic, atraumatic. Pupils equal and reactive to light.  NECK:  Supple. No JVD, bruits, adenopathy.  LUNGS:  Clear to auscultation and percussion. No significant wheezes, rhonchi, or rales. HEART:  Regular rhythm. No murmurs, gallops or rubs.  ABDOMEN:  Benign.  EXTREMITIES: Within normal limits.  NEUROLOGIC: Intact.  SKIN:  Normal.   LABORATORIES:  V/Q scan was indeterminate.  CT of the chest was essentially negative. EKG: Normal sinus rhythm, nonspecific ST-T wave changes, rate of 99.  Glucose 134, BNP 182, BUN 12, creatinine 0.93. Sodium 139, potassium 2.1. White count 11, hemoglobin 11.6, hematocrit 35, platelet count 297. PT/INR normal. ABG: 7.58, 23, pO2 of 136, less than 28% FiO2.   Chest x-ray unremarkable. Bilateral lower extremity Doppler is negative.   ASSESSMENT: 1.  Chest pain,  possible deep venous thrombosis, which has been ruled out.  2.  Obesity.  3.  Anxiety. 4.  Obstructive sleep apnea.  5.  Reflux. 6.  Restless legs syndrome.  7.  Hypokalemia. 8.  History of asthma.   PLAN:  Agree with admit.  Rule out for myocardial infarction. If her pulmonary workup for DVT is negative, as we have found, will probably treat the patient medically. She may need a cardiac cath or functional study. Because of her contrast allergy, I am worried that she may have issues with the contrast, so we may just proceed with a Lexiscan Myoview. If it is negative, then I think her cardiac workup would be complete at that point, and evaluation for anxiety or GI evaluation should be undertaken. Continue therapy for obstructive sleep apnea, including CPAP. Continue therapy for restless legs syndrome. Would recommend weight loss and exercise. Would strongly consider reducing stress and addressing anxiety issues. Would correct her electrolytes and will provide inhalers as needed p.r.n. for possible asthma. Will have the patient follow up with her cardiologist as an outpatient if the studies are negative, but will probably proceed with some sort of functional study in the morning.      ____________________________ Loran Senters. Clayborn Bigness, MD ddc:mr D: 12/16/2012 18:35:00 ET T: 12/16/2012 19:48:21 ET JOB#: [id number]  cc: Dwayne D. Clayborn Bigness, MD, <Dictator>   Yolonda Kida MD ELECTRONICALLY SIGNED 01/17/2013 10:05

## 2014-12-27 NOTE — Discharge Summary (Signed)
PATIENT NAME:  Brittany Nguyen, SIX MR#:  [id number] DATE OF BIRTH:  Jan 25, 1966  DATE OF ADMISSION:  11/07/2013 DATE OF DISCHARGE:  11/08/2013  ADMITTING DIAGNOSIS: Right-sided chest discomfort, right shoulder pain with swelling.   DISCHARGE DIAGNOSES: 1.  Right-sided chest pain, very atypical. Negative VQ scan. Right-sided shoulder pain, possible inflammatory in nature. X-ray of her shoulder negative. ESR normal. There is no evidence of any other abnormality. The patient also has multiple other joints and muscles involved. She may have fibromyalgia. Needs outpatient rheumatological evaluation.  2.  Previous history of abnormal stress test. Needs outpatient followup with cardiology.  3.  History of congestive heart failure, with the most recent echocardiogram showing ejection fraction of 45% to 50%.  4.  Obstructive sleep apnea on CPAP.  5.  Status post appendectomy.  6.  Status post cesarean section.   CONSULTANTS: Dr. Ubaldo Glassing, Dr. Mack Guise.   PERTINENT LABORATORY, DIAGNOSTIC, AND RADIOLOGICAL DATA: EKG: Sinus tachycardia. D-dimer 0.56. TSH 2.28. BNP 37. Glucose 148, BUN 14, creatinine 0.97, sodium 139, potassium 3.3, chloride 106, CO2 of 29. WBC 8.3, hemoglobin 11.1, platelet count 247. Chest x-ray shows no (Dictation Anomaly)   cardiopulmonary processes. Sedimentation rate was 20. VQ scan showed low probability for PE. Ultrasound of right upper extremity negative for DVT. Ultrasound of the right lower extremity negative for DVT. Cardiac enzymes x 3 negative. C-spine shows mild osteoarthritic changes. Right shoulder x-ray shows negative exam.   HOSPITAL COURSE: Please refer to H and P done by the admitting physician. The patient is a 49 year old obese female who presented with vague complains of right-sided chest pain and right shoulder pain, right arm swelling. Due to these symptoms, she was admitted and seen by cardiology. They did not feel that this was cardiac. The patient started complaining of  right arm swelling. Had a Doppler of the arm, which was negative. She also had a sedimentation rate that was normal. She was seen by orthopedics. There was a question raised of fibromyalgia. The patient does need to have this evaluated as an outpatient. At this time, she is doing much better, stable for discharge. She will be treated with prednisone taper and outpatient follow-up.  DISCHARGE MEDICATIONS: Metoprolol tartrate 25 mg 1 tab p.o. b.i.d., Lasix 20 mg 1 tab p.o. daily, ropinirole 2 mg 2 tabs at bedtime; prednisone taper 60 mg, taper by 10 until complete; ibuprofen 400 q.6 x 3 days and then p.r.n., atorvastatin 20 at bedtime, aspirin 325 daily, acetaminophen/oxycodone 325/5 mg 1 tab p.o. q.6 p.r.n. for pain.   DIET: Low-sodium, low-fat, low-cholesterol.   ACTIVITY: As tolerated.   FOLLOWUP: With primary MD in 1 to 2 weeks. Follow up with rheumatology in 1 to 2 weeks with Dr. Jefm Bryant.  TIME SPENT: 35 minutes.   ____________________________ Lafonda Mosses Posey Pronto, MD shp:jcm D: 11/08/2013 14:11:04 ET T: 11/08/2013 21:45:16 ET JOB#: [id number]  cc: Sherral Dirocco H. Posey Pronto, MD, <Dictator> Alric Seton MD ELECTRONICALLY SIGNED 11/12/2013 15:32

## 2014-12-27 NOTE — Consult Note (Signed)
Brief Consult Note: Diagnosis: Right shoulder and upper extremity pain and swelling.   Patient was seen by consultant.   Consult note dictated.   Recommend further assessment or treatment.   Orders entered.   Comments: 49 y/o RHD female with pain in right shoulder without injury.  + swelling in right forearm.  ROM limited due to pain.  WBC nl.  ESR 20. I have ordered xrays on right shoulder and c spine.  Patient already ordered for prednisone and ibuprofen.  Symptoms seem consistent with inflammation.  Will follow up on radiographs once completed.  Electronic Signatures: Thornton Park (MD)  (Signed 05-Mar-15 20:12)  Authored: Brief Consult Note   Last Updated: 05-Mar-15 20:12 by Thornton Park (MD)

## 2014-12-27 NOTE — H&P (Signed)
PATIENT NAME:  Brittany Nguyen, Brittany Nguyen MR#:  [id number] DATE OF BIRTH:  1966-07-11  DATE OF ADMISSION:  04/15/2014  ADMITTING PHYSICIAN: Gladstone Lighter, MD.   PRIMARY CARE PHYSICIAN: Dr. Kary Kos.   PRIMARY CARDIOLOGIST: Dr. Nehemiah Massed.   CHIEF COMPLAINT: Chest pain.   HISTORY OF PRESENT ILLNESS: [name] is a 49 year old Caucasian female with past medical history significant for coronary artery disease with prior admission for end N-STEMI, no cardiac catheterization done, history of systolic CHF, EF of 78% to 50%, obstructive sleep apnea, presents to the hospital secondary to chest pain that started this morning. The patient had a prior admission for chest pain in March of this year and workup was negative, and it was deemed more musculoskeletal pain and she was discharged home on pain medications. The patient said she was feeling fine. She has been under a lot of stress due to her work recently. She woke up about 5:50 this morning with a heavy pressure in her chest which was later more sharp pain radiating to her jaw and also to her back. The patient never had a catheterization done for her prior myocardial infarction. She states the pain was not relieving. She started getting ready to go to work; however, the pain was getting worse, so she presented to the hospital. She was also nauseous and diaphoretic at the time. In the ER, she had an elevated D-dimer and first troponin was negative so she had a V/Q scan due to IV contrast allergy which is negative for PE at this time. The patient is being admitted for further evaluation of her chest pain at this time. The patient also complains of a dry cough that has been going on for almost 2 weeks now with no improvement. Her chest x-ray was clear and she has not been on any antibiotics recently.   PAST MEDICAL HISTORY:  1.  Systolic congestive heart failure, ejection fraction of 45% to 50%.  2.  Obstructive sleep apnea.  3.  Coronary artery disease with an  admission for myocardial infarction in the past.   PAST SURGICAL HISTORY:  1.  Diverticulosis, resulting in partial colonic resection.  2.  C-section.  3. Appendectomy.   ALLERGIES: BETADINE, CODEINE, CONTRAST DYE, PENICILLIN AND LATEX.  CURRENT HOME MEDICATIONS:  1.  Ropinirole 2 mg 2 tablets p.o. at bedtime.  2.  Metoprolol 25 mg p.o. b.i.d.  3.  Lasix 20 mg p.o. daily.   SOCIAL HISTORY: Lives at home with her husband. 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 No smoking or alcohol or drug abuse.   FAMILY HISTORY: Father and mother both died from heart disease in their late 101s and 35s. Father even had a pacemaker in his 37s. Sister also died from heart attack in her 60s.   REVIEW OF SYSTEMS:  CONSTITUTIONAL: No fever, fatigue, or weakness.  EYES: No blurred vision, double vision, inflammation, or glaucoma.  ENT: No tinnitus, ear pain, hearing loss, epistaxis, or discharge.  RESPIRATORY: Positive for cough. No wheeze, hemoptysis, or COPD.   CARDIOVASCULAR: Posterior chest pain. No orthopnea, edema, arrhythmia, palpitations, or syncope.  GASTROINTESTINAL: Positive for nausea. No vomiting, diarrhea, abdominal pain, hematemesis, or melena.  GENITOURINARY: No dysuria, hematuria, renal calculus, frequency, or incontinence.  ENDOCRINE: No polyuria, nocturia, thyroid problems, heat or cold intolerance.   HEMATOLOGY: No anemia, easy bruising, or bleeding.  SKIN: No acne, rash, or lesions.  MUSCULOSKELETAL: No neck, back, shoulder pain, arthritis, or gout.  NEUROLOGIC: No numbness, weakness, CVA, TIA, or seizures.  PSYCHOLOGIC: No anxiety, insomnia, or depression.   PHYSICAL EXAMINATION: VITAL SIGNS: Temperature 97.6 degrees Fahrenheit , pulse 75, respirations 21, blood pressure 100/70, pulse oximetry 94% on room air.  GENERAL: Well-built, well-nourished female lying in bed, not in any acute distress.  HEENT: Normocephalic, atraumatic. Pupils equal, round, reacting to light. Anicteric sclerae.  Extraocular movements intact. Oropharynx is clear without erythema, mass, or exudates.  NECK: Supple. No thyromegaly, JVD, or carotid bruits. No lymphadenopathy.  LUNGS: Moving air bilaterally. No wheeze or crackles. No use of accessory muscles for breathing.  CARDIOVASCULAR: S1, S2, regular rate and rhythm. No murmurs, rubs, or gallops. No chest wall tenderness noted.  ABDOMEN: Soft, nontender, nondistended. No hepatosplenomegaly. Normal bowel sounds.  EXTREMITIES: No pedal edema. No clubbing or cyanosis, 2+ dorsalis pedis pulses felt bilaterally.  SKIN: No acne, rash, or lesions.  LYMPHATICS: No cervical or inguinal lymphadenopathy.  NEUROLOGIC: Cranial nerves intact. No focal motor or sensory deficits.  PSYCHOLOGICAL: The patient is awake, alert, and oriented x 3.   LABORATORY DATA: WBC 9.0, hemoglobin 12.2, hematocrit 37.6, platelet count of 24.  Sodium 141, potassium 3.4, chloride 107, bicarbonate 23, BUN 10, creatinine 1.09, glucose 109, calcium 8.5. BNP is within normal limits at 64. D-dimer elevated at 924. INR is 1.0. PTT 26.7. First troponin is less than 0.02. CK 66, CK-MB less than 0.5. Chest x-ray showing clear lung fields, heart size is normal. No pneumothorax. No effusion. No bony abnormality. No infiltrate seen. Lung V/Q scan showing normal ventilation/perfusion. Lung scan, no evidence of pulmonary embolism. No change since the prior study. A very low probability for PE. EKG showing sinus tachycardia, nonspecific T wave abnormality in anterior leads, but no other acute ST-T wave abnormalities.   ASSESSMENT AND PLAN: A 49 year old female with history of coronary artery disease, strong family history of coronary artery disease, obstructive sleep apnea, congestive heart failure, admitted for chest pain that started this morning.  1.  Chest pain. Either it could be unstable angina versus musculoskeletal from continuous cough. The patient has strong family history of cardiac disease. Her V/Q  scan is negative for PE. Chest x-ray clear for any pneumonia. Will get pain medications p.r.n., admit for rule out myocardial infarction, troponins, monitor on telemetry, continue aspirin and metoprolol. Because she got the V/Q scan today, Myoview cannot be done until 48 hours,  so will get cardiology followup to see if this needs to be done as inpatient or outpatient. Cough medications have been ordered as well.  2.  Congestive heart failure, likely systolic dysfunction, ejection fraction of 45%, appears well-compensated. Chest x-ray is clear. Hold Lasix as mildly dehydrated. Gentle hydration and followup.  3.  Restless leg syndrome. Continue ropinirole.  4.  Coronary artery disease history. The patient had non-STEMI in the past but never had a cardiac catheterization done. Not sure if it was a true non-STEMI or demand ischemia at that time. Last Myoview was normal. Not on any cardiac medications at home, so will start on aspirin, metoprolol here. Check lipid profile. Cardiology followup.  5.  Deep vein thrombosis prophylaxis.  6.  Chest pain, improved. Check with cardiology if stress test can be done as an outpatient and discharge tomorrow.    TOTAL TIME SPENT ON ADMISSION: 50 minutes.     ____________________________ Gladstone Lighter, MD rk:at D: 04/15/2014 13:25:24 ET T: 04/15/2014 14:53:38 ET JOB#: [id number]  cc: Gladstone Lighter, MD, <Dictator> Corey Skains, MD Irven Easterly. Kary Kos, MD Gladstone Lighter MD ELECTRONICALLY SIGNED 04/22/2014 14:24

## 2014-12-27 NOTE — Consult Note (Signed)
PATIENT NAME:  Brittany Nguyen, Brittany Nguyen MR#:  [id number] DATE OF BIRTH:  [birth year]  DATE OF CONSULTATION:  11/07/2013  PRIMARY CARE PHYSICIAN:  Dr. Kary Kos   REFERRING PHYSICIAN:  Dr. Margaretmary Eddy  CONSULTING PHYSICIAN:  Isaias Cowman, MD  CHIEF COMPLAINT: Right-sided pain.   HISTORY OF PRESENT ILLNESS: The patient is a 49 year old female with history of congestive heart failure, obstructive sleep apnea. She has a one-week history of right-sided neck, shoulder, chest discomfort unrelated to exertion. She presented to [facility] Emergency Room, where EKG was nondiagnostic. The patient was admitted to telemetry, where she was ruled out for myocardial infarction by CPK isoenzymes and troponin. D-dimer is elevated. V/Q scan was performed today, which did not reveal evidence for pulmonary emboli. The patient has received intravenous Toradol, without significant improvement in overall pain.   PAST MEDICAL HISTORY: 1.  Diastolic congestive heart failure.  2.  Obstructive sleep apnea.   MEDICATIONS: Metoprolol tartrate 25 mg b.i.d., furosemide 20 mg daily.   SOCIAL HISTORY: The patient is married, resides with her husband. She denies tobacco abuse.   FAMILY HISTORY: Mother died status post MI at age 79.   REVIEW OF SYSTEMS:  CONSTITUTIONAL: The patient denies fever or chills.  EYES: No blurry vision.  EARS: No hearing loss.  RESPIRATORY: The patient has shortness of breath.  CARDIOVASCULAR: The patient has right-sided chest, shoulder and neck discomfort.  GASTROINTESTINAL: No nausea, vomiting or diarrhea.  GENITOURINARY: No dysuria or hematuria.  ENDOCRINE: No polyuria or polydipsia.  MUSCULOSKELETAL: No arthralgias or myalgias.  NEUROLOGIC: No focal muscle weakness or numbness.  PSYCHOLOGICAL: No depression or anxiety.   PHYSICAL EXAMINATION: VITAL SIGNS: Blood pressure 110/68, pulse 85, respirations 18, temperature 97.3, pulse ox 99%.  HEENT: Pupils equal, reactive to light and accommodation.  NECK:  Supple, without thyromegaly.  LUNGS: Clear.  CARDIOVASCULAR:  Normal JVP. Normal PMI. Regular rate and rhythm. Normal S1, S2. No appreciable gallop, murmur or rub.  ABDOMEN: Soft, nontender. Pulses were intact bilaterally.  MUSCULOSKELETAL: Normal muscle tone.  NEUROLOGIC: The patient is alert and oriented x 3. Motor and sensory both grossly intact.   IMPRESSION: A 49 year old female who presents with right-sided neck, shoulder and chest discomfort, with atypical features. The patient has ruled out for myocardial infarction by CPK isoenzymes and troponin. V/Q scan was negative for pulmonary emboli.   RECOMMENDATIONS: 1.  Agree with overall current therapy.  2.  Would defer full dose anticoagulation.  3.  Consider Lexiscan sestamibi study.  4.  Would defer cardiac catheterization, since the patient has ruled out with negative troponin and with atypical chest pain.    ____________________________ Isaias Cowman, MD ap:mr D: 11/07/2013 17:11:41 ET T: 11/07/2013 19:23:13 ET JOB#: [id number]  cc: Isaias Cowman, MD, <Dictator> Isaias Cowman MD ELECTRONICALLY SIGNED 11/08/2013 13:15

## 2014-12-27 NOTE — Consult Note (Signed)
PATIENT NAME:  Brittany Nguyen, Brittany Nguyen MR#:  [id number] DATE OF BIRTH:  [birth year]  DATE OF CONSULTATION:  04/16/2014  CONSULTING PHYSICIAN:  Corey Skains, MD  CARDIAC NOTE  The patient had no evidence of chest discomfort throughout the evening and was mildly short of breath but weak. The patient has had no other review of systems issues. She has had continued stress test with no evidence of chest pain but mild shortness of breath with normal chronotropic competence. The patient has had no evidence of myocardial ischemia or other rhythm disturbances. Echocardiogram has shown mild-to-moderate global LV systolic dysfunction, not significantly worse than before, and is continued on appropriate medication management for her sleep apnea and other cardiovascular concerns as well as lower extremity edema, which is improved at this time. Currently there is no evidence of myocardial infarction, and need for further hospitalization and further treatment options can be performed as an outpatient.   ____________________________ Corey Skains, MD bjk:DT D: 04/17/2014 08:35:52 ET T: 04/17/2014 11:38:32 ET JOB#: [id number]  cc: Corey Skains, MD, <Dictator> Corey Skains MD ELECTRONICALLY SIGNED 04/17/2014 12:48

## 2014-12-27 NOTE — H&P (Signed)
PATIENT NAME:  Brittany Nguyen, Brittany Nguyen MR#:  [id number] DATE OF BIRTH:  12-29-1965  DATE OF ADMISSION:  11/07/2013  PRIMARY CARE PHYSICIAN: Dr. Jarome Lamas.     CHIEF COMPLAINT: Right-sided chest pain and elevated D-dimer.   HISTORY OF PRESENT ILLNESS: The patient is a 49 year old, obese, Caucasian female with past medical history of coronary artery disease, congestive heart failure and obstructive sleep apnea, lives on CPAP at bedtime. She is presenting to the ER with a chief complaint of constant right-sided chest pain. The patient is reporting that she started having right shoulder pain radiating to the right side of the chest approximately 3 to 4 days ago. It is constant in nature, achy pain, feels heavy in the right shoulder, 7 out of 10 with no radiation. Denies any nausea, vomiting or shortness of breath. She took ibuprofen with no significant improvement. She sees Dr. Nehemiah Massed as an outpatient. Has not recently had any stress test. She has chronic history of shortness of breath but denies any worsening of the shortness of breath recently. Denies any lower extremity pain or tenderness. No previous history of pulmonary embolism or DVT. The patient's initial troponin is negative, but D-dimer is elevated at 0.56. ER physician has ordered CT angiogram of the chest, but subsequently, it was discontinued as the patient is allergic to CONTRAST DYE and had anaphylaxis in the past with hives and shortness of breath. V/Q scan is ordered which is pending at this time. The patient is still complaining of 7 out of 10 right shoulder pain during my examination. Husband is at bedside. She received IV Toradol in the ER with no significant improvement. She denies any strenuous exercise or lifting heavy weights. No recent history of travel in the past 2 weeks.   PAST MEDICAL HISTORY:  1. Congestive heart failure, recent ejection fraction 45% to 50%.  2. Obstructive sleep apnea, on CPAP at bedtime.  3. Coronary artery  disease.   PAST SURGICAL HISTORY: Appendectomy, C-section.   ALLERGIES: BETADINE, CODEINE, CONTRAST DYE, PENICILLIN, LATEX.   PSYCHOSOCIAL HISTORY: Lives at home with husband. No history of smoking, alcohol or illicit drug usage.   FAMILY HISTORY: Mother died of a heart attack at age 39.   HOME MEDICATIONS: Ropinirole 2 mg 2 tablets p.o. once a day, metoprolol tartrate 25 mg 1 tablet p.o. b.i.d., furosemide 20 mg p.o. once daily.   REVIEW OF SYSTEMS:  CONSTITUTIONAL: Denies any fever, fatigue or weakness.  EYES: Denies blurry vision, double vision.  ENT: Denies epistaxis, discharge.  RESPIRATION: Chronic shortness of breath. Denies cough. No history of COPD. Has obstructive sleep apnea, lives on CPAP at bedtime.  CARDIOVASCULAR: Complaining of right shoulder and right-sided chest pain. No palpitations. Denies any syncope.  GASTROINTESTINAL: Denies nausea, vomiting, diarrhea.  GENITOURINARY: No dysuria or hematuria.  GYNECOLOGIC AND BREASTS: Denies breast mass or vaginal discharge.  ENDOCRINE: Denies polyuria, nocturia, thyroid problems.  HEMATOLOGIC AND LYMPHATIC: Denies anemia, easy bruising or bleeding.  INTEGUMENTARY: No acne, rash, lesions.  MUSCULOSKELETAL: No joint pain in the neck or back. Complaining of right shoulder pain for the past 3 days which is constant in nature.  NEUROLOGIC: Denies vertigo or ataxia.  PSYCHIATRIC: No ADD, OCD.   PHYSICAL EXAMINATION:  VITAL SIGNS: Temperature 98.4, pulse 95, respirations 20, blood pressure 104/69, pulse ox 96%.  GENERAL APPEARANCE: Not in any acute distress. Moderately built and obese.  HEENT: Normocephalic, atraumatic. Pupils are equal and reacting to light and accommodation. No scleral icterus. No conjunctival injection. No sinus  tenderness. No postnasal drip.  NECK: Supple. No JVD. No thyromegaly. Range of motion is intact.  LUNGS: Clear to auscultation bilaterally. No accessory muscle usage. No anterior chest wall tenderness on  palpation.  CARDIOVASCULAR: S1, S2 normal. Regular rate and rhythm. No murmurs.  GASTROINTESTINAL: Soft. Bowel sounds are positive in all 4 quadrants. Obese, nontender, nondistended. No hepatosplenomegaly. No masses felt.  NEUROLOGIC: Awake, alert, oriented x 3. Cranial nerves II through XII are grossly intact. Motor and sensory are intact. Reflexes are 2+.  EXTREMITIES: Right upper extremity: Complaining of shoulder pain. Diffuse tenderness is present. Range of motion is limited from tenderness. Left upper extremity and lower extremities are intact. Peripheral pulses are intact. No cyanosis. No clubbing. No edema.  PSYCHIATRIC: Normal mood and affect.   LABORATORIES AND IMAGING STUDIES: Troponin is normal. TSH is normal. CBC: WBC is at 8.3, hemoglobin 11.1,  platelets are 247. D-dimer is elevated at 0.56. BMP: Glucose 148, BNP 37, BUN 14, creatinine 0.97, sodium 139, potassium 3.3, chloride 106, CO2 29. Anion gap is 4. GFR greater than 60. Serum osmolality 281. Calcium 8.7. Chest x-ray, portable: No active disease. A 12-lead EKG: Sinus tachycardia at 117 beats per minute. Normal PR and QRS interval. No acute ST-T wave changes. No old EKGs to compare with. Age undetermined anterior infarct is present.   ASSESSMENT AND PLAN: A 49 year old Caucasian female with history of coronary artery disease presenting to the Emergency Room with a chief complaint of right-sided chest pain and right shoulder pain for the past 3 days with elevated D-dimer. Will be admitted with the following assessment and plan:  1. Atypical chest pain with elevated D-dimer: Will rule out pulmonary embolism. 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 Will get V/Q scan in a.m. The patient has received 1 dose of Lovenox 1 mg/kg subcutaneous x 1 in the Emergency Room. Acute coronary syndrome protocol with oxygen, nitroglycerin, beta blocker and statin. Cycle cardiac biomarkers.  The patient will be monitored on telemetry. Cardiology consult is placed to Dr. Nehemiah Massed.  2. History of obstructive sleep apnea: Will continue CPAP at bedtime.  3. History of congestive heart failure: Currently not fluid overloaded. Will continue Lasix and provide potassium supplement in view of hypokalemia.  4. History of coronary artery disease: Will resume her home medications. Will provide her morphine intravenous on an as needed basis.   She is FULL CODE. Husband is the medical power of attorney. Diagnosis and plan of care was discussed in detail with the patient. She is aware of the plan.   TOTAL TIME SPENT ON ADMISSION: 45 minutes.   ____________________________ Nicholes Mango, MD ag:gb D: 11/07/2013 01:15:02 ET T: 11/07/2013 02:46:43 ET JOB#: [id number]  cc: Nicholes Mango, MD, <Dictator> Nicholes Mango MD ELECTRONICALLY SIGNED 11/21/2013 23:26

## 2014-12-27 NOTE — Consult Note (Signed)
PATIENT NAME:  Brittany Nguyen, Brittany Nguyen MR#:  [id number] DATE OF BIRTH:  February 14, 1966  DATE OF CONSULTATION:  04/15/2014  REFERRING PHYSICIAN:  Tana Conch. Leslye Peer, MD CONSULTING PHYSICIAN:  Corey Skains, MD  REASON FOR CONSULTATION: Substernal chest pain, sleep apnea, pulmonary edema.   CHIEF COMPLAINT: Chest pain.  HISTORY OF PRESENT ILLNESS:  This is a 49 year old female with no history of cardiovascular disease, having some tachycardia, sleep apnea, lower extremity and upper extremity edema, off and on over the last year. The patient has had a stress test and Holter monitor with echocardiogram showing normal LV systolic function and has had treatment with beta blocker for heart rate control. Also, she been using diuretics for extremity edema, which is relatively stable. The patient has had new onset substernal chest discomfort radiating into her back with other acute tingling and shortness of breath. She has also been diaphoretic with any physical activity and has had some weakness and fatigue. The patient has had this for quite some time, multifactorial in nature. Currently, EKG shows normal sinus rhythm, normal EKG, and additionally, there is no evidence of elevation of troponin. The patient has been using a CPAP machine diligently.   REVIEW OF SYSTEMS: Negative for vision change, ringing in the ears, hearing loss, cough, congestion, heartburn, nausea, vomiting, diarrhea, bloody stools, stomach pain, extremity pain, leg weakness, cramping in the buttocks, known blood loss, headaches, blackouts, dizzy spells, nosebleeds, frequent urination, urination at night, muscle weakness, numbness, anxiety, depression, skin lesions, skin rashes.   PAST MEDICAL HISTORY: 1.  Sleep apnea. 2.  Tachycardia. 3.  Edema.    FAMILY HISTORY: No family members with early onset of cardiovascular disease or hypertension.   SOCIAL HISTORY: Currently denies alcohol or tobacco use.   ALLERGIES: As listed.   MEDICATIONS:  As listed.   PHYSICAL EXAMINATION: VITAL SIGNS: Blood pressure is 110/68 bilaterally. Heart rate 72 upright, reclining, and regular.  GENERAL: She is a well-appearing female in no acute distress.  HEAD, EYES, EARS, NOSE, AND THROAT: No icterus, thyromegaly, ulcers, hemorrhages, or xanthelasma.  CARDIOVASCULAR: Regular rate and rhythm. Normal S1 and S2, without murmur, gallop, or rub. PMI is normal size and placement. Carotid upstroke normal without bruit. Jugular venous pressure is normal.  LUNGS: Had a few basilar crackles with normal respirations.  ABDOMEN: Soft, nontender, without hepatosplenomegaly or masses. Abdominal aorta is normal size without bruit.  EXTREMITIES: Show 2+ bilateral pulses in dorsal pedal, radial, and femoral arteries without lower extremity edema, cyanosis, clubbing, or ulcers.  NEUROLOGIC: She is oriented to time, place, and person, with normal mood and affect.   ASSESSMENT: A 49 year old female with sleep apnea, diffuse edema in the past with tachycardia, sleep apnea, and chest pain consistent with unstable angina and no current evidence of myocardial infarction.   RECOMMENDATIONS: 1.  Continue serial ECG and enzymes to assess for possible myocardial infarction.  2. Possible treadmill EKG to assess exercise tolerance, myocardial ischemia, rhythm disturbances, and/or chronotropic incompetence and need for adjustments of metoprolol.  3.  Consider repeat echocardiogram for left ventricular systolic dysfunction causing above.  4.  Continue diuresis for diffuse edema.  5.  Continue diligent CPAP machine for sleep apnea. 6.  Further consideration of endocrine abnormalities.  7.  Ambulation, and follow for treatment options.   ____________________________ Corey Skains, MD bjk:LT D: 04/15/2014 17:44:06 ET T: 04/15/2014 18:31:30 ET JOB#: [id number]  cc: Corey Skains, MD, <Dictator> Corey Skains MD ELECTRONICALLY SIGNED 04/16/2014 10:19

## 2014-12-27 NOTE — Discharge Summary (Signed)
PATIENT NAME:  Brittany Nguyen, Brittany Nguyen MR#:  [id number] DATE OF BIRTH:  1965-09-30  DATE OF ADMISSION:  04/15/2014 DATE OF DISCHARGE:    ADMISSION DIAGNOSIS: Chest pain.   DISCHARGE DIAGNOSES: 1.  Chest pain.  2.  Congestive heart failure, ejection fraction of 40%.  3.  Hypertension.   CONSULTATIONS: Dr. Nehemiah Massed.   LABORATORIES: White blood cells 6, hemoglobin 11, hematocrit 34, platelets 143. Potassium 4.1, chloride 110, bicarbonate 31, BUN 9, creatinine 1.14, glucose is 97.  Troponins x 3 were negative.  A 2-D echocardiogram showed EF of 40% to 45% with mildly decreased global left ventricular systolic function and mild mitral valve regurgitation.   STRESS TEST WAS NORMAL NO [place] COURSE: A 49 year old female who presented with chest pain. She had a V/Q scan performed in the ER which was negative. For further details, please refer to the H and P.  1.  Chest pain, likely musculoskeletal from continued cough. The patient was admitted to rule out unstable angina/acute coronary syndrome. Her troponins were all negative. Her V/Q scan which was performed in the ER for PE was negative. Her chest x-ray showed no evidence of pneumonia. The patient underwent stress test which was negative for ischemia.  2.  CHF, chronic and systolic well compensated.  3.  Restless leg syndrome, on Requip.  4.  History of CAD per the family. The patient had a non-STEMI in the past, but not sure if it a true non-STEMI or demand ischemia. She never had a cardiac catheterization. Her last Myoview as per Dr. Nehemiah Massed was normal. She is on aspirin and metoprolol.   DISCHARGE MEDICATIONS: 1.  Aspirin 81 mg daily.  2.  Requip 2 mg at bedtime.  3.  Lasix 20 mg daily.  4.  Metoprolol 25 mg b.i.d.   DISCHARGE DIET: Regular diet.   DISCHARGE ACTIVITY: As tolerated.   DISCHARGE FOLLOWUP: The patient is to follow up in 1 to 2 days with Dr. Nehemiah Massed. The patient was stable for discharge.    TIME SPENT:  Approximately 35 minutes,    ____________________________ Ithan Touhey P. Benjie Karvonen, MD spm:at D: 04/16/2014 12:28:02 ET T: 04/16/2014 13:24:17 ET JOB#: [id number]  cc: Mirage Pfefferkorn P. Benjie Karvonen, MD, <Dictator> Corey Skains, MD Donell Beers Maikel Neisler MD ELECTRONICALLY SIGNED 04/17/2014 13:14

## 2014-12-28 IMAGING — CR DG CHEST 1V PORT
1 series · 1 of 1 positions shown · non-contrast
Comparison: 07/13/2013 and prior chest radiographs

CLINICAL DATA: 47-year-old female with cough, shortness breath
chest pain.

EXAM:
PORTABLE CHEST - 1 VIEW

[ap]
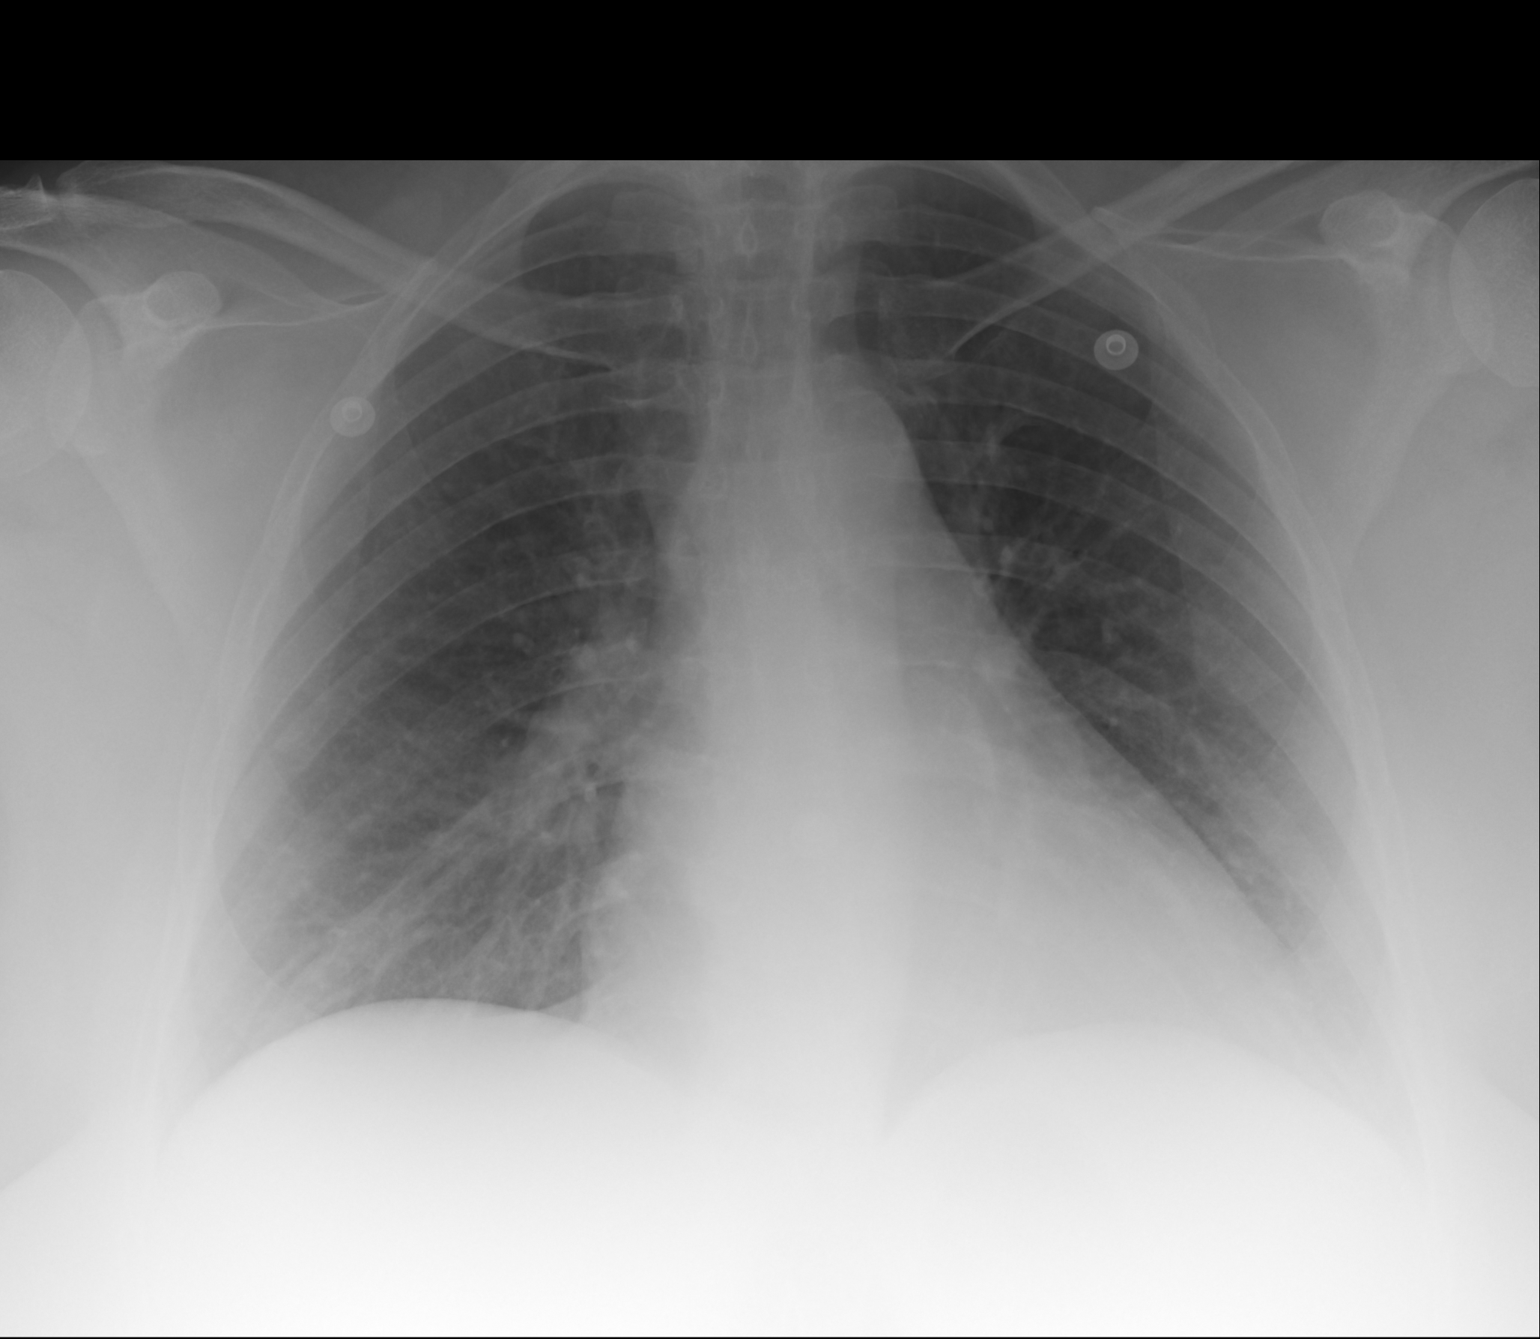

[1 of 1 positions shown; findings below may reference images not displayed]

FINDINGS: Cardiomegaly and mild peribronchial thickening again noted.

There is no evidence of focal airspace disease, pulmonary edema,
suspicious pulmonary nodule/mass, pleural effusion, or pneumothorax.
No acute bony abnormalities are identified.
IMPRESSION: Cardiomegaly without evidence of acute cardiopulmonary disease.

## 2014-12-28 NOTE — Discharge Summary (Signed)
PATIENT NAME:  Brittany Nguyen, Brittany Nguyen MR#:  [id number] DATE OF BIRTH:  August 02, 1966  DATE OF ADMISSION:  11/18/2011 DATE OF DISCHARGE:  11/18/2011  PRIMARY CARE PHYSICIAN: [name], MD   REASON FOR ADMISSION: Chest pain.   DISCHARGE DIAGNOSES:  1. Chest pain felt to be noncardiac in etiology. The patient has ruled out for an MI and has underwent negative cardiac stress test and chest CT was negative for PE, could be anxiety related versus GI related.  2. Iron deficiency anemia. 3. History of known thyroid nodule.  4. History of RLS.  5. History of unspecified heart disease with negative stress test in 2011.  6. Family history positive for sudden cardiac death.   CONSULTS: None.  PROCEDURES:  1. CT chest with PE protocol 11/18/2011 no evidence of acute PE, no acute thoracic abnormality otherwise.  2. Lexiscan Myoview 11/18/2011 normal Lexiscan and EKG without evidence of ischemia or arrhythmia. Normal LV systolic function with EF 59%, normal myocardial perfusion without evidence of myocardial ischemia.   PERTINENT LABORATORY DATA: Renal function normal on admission. Cardiac enzymes negative x3 sets. CBC WBC 9.4, hemoglobin 10.3, hematocrit 31.9, platelets 283, MCV 75. D-dimer 0.94. INR 0.9. EKG on admission sinus tachycardia, heart rate 108 beats per minute without acute ST or T wave changes.   BRIEF HISTORY/HOSPITAL COURSE: The patient is a 49 year old female with past medical history of a thyroid nodule, RLS, unspecified heart disease with negative stress test in 2011, family history of sudden cardiac death, history of iron deficiency anemia who was noncompliant with iron therapy who presented to the Emergency Department with complaints of chest pain. Please see admission history and physical for pertinent details surrounding the onset of this hospitalization. Please see below for further details.  1. Chest pain felt to be of noncardiac etiology. Initial EKG was negative for acute ST or T wave  changes. First set of cardiac enzymes were negative. Thereafter, the patient was admitted to the Observation Unit for further evaluation and management. She had a slightly elevated D-dimer with sinus tachycardia. Therefore, she was sent for a CT of the chest with PE protocol which was negative for PE. No thoracic pathology was noted. She was initially started on aspirin. She was also given p.r.n. pain control with Ultram as well as anxiolytic and thereafter her chest pain had resolved. She was ruled out for MI based on three negative sets of cardiac enzymes and thereafter was sent for a Lexiscan which was a normal study and there was no ischemia noted. She has no underlying risk factors for coronary artery disease, therefore, her aspirin therapy has been discontinued especially given bleeding risk with underlying iron deficiency anemia. Her chest pain could be anxiety related for which she has been started on low dose p.r.n. anxiolytic. It could also be GI related as she has iron deficiency anemia but she denied any melena, hematochezia, abdominal pain, nausea or vomiting. The patient prefers to undergo outpatient GI evaluation rather than in the inpatient setting. Therefore, she will be referred to GI as an outpatient and has since been started on b.i.d. PPI therapy as well as iron supplementation. She is chest pain free at the time of discharge at rest and with ambulation. The patient has an unspecified heart disease and also family history of sudden cardiac death for which she will need to continue to follow-up with her cardiologist, Dr. Saralyn Pilar.  2. Iron deficiency anemia. She has been started on iron supplement and PPI therapy and will be  referred to GI as an outpatient. There is no obvious active bleeding noted. Denies any melena, hematochezia, abdominal pain, nausea or vomiting as above. There were no indications for blood transfusion. This will need to be closely monitored and further worked up as an  outpatient. 3. History of known thyroid nodule. The patient wishes to follow-up with Dr. Kary Kos as he's already started the work-up for this condition.  4. Restless leg syndrome. The patient is to continue Requip.   On 11/18/2011 the patient was without any chest pain and felt to be stable for discharge home with close outpatient follow-up to which the patient is agreeable.  DISCHARGE DISPOSITION: Home.  DISCHARGE CONDITION: Improved, stable.  DISCHARGE ACTIVITY: As tolerated.  DISCHARGE DIET: Regular.  DISCHARGE MEDICATIONS: 1. Iron sulfate 325 mg p.o. daily. 2. Over-the-counter stool softener once a day as needed p.r.n. constipation.  3. Klonopin 0.5 mg p.o. q.8 hours p.r.n. anxiety. 4. Tramadol 50 mg p.o. q.12 hours p.r.n. pain. 5. Prilosec 20 mg p.o. b.i.d.   DISCHARGE INSTRUCTIONS:  1. Take medications as prescribed. 2. Return to the Emergency Department for recurrence of symptoms or nausea, vomiting, abdominal pain, blood in the stool or dark black stools.  3. Return to the Emergency Department with dizziness, lightheadedness, weakness, shortness of breath, or feeling faint.   FOLLOW-UP INSTRUCTIONS: Follow-up with Dr. Kary Kos and Dr. Saralyn Pilar within 1 to 2 weeks.   REFERRALS: The patient is referred to first available physician at [facility] for noncardiac chest pain and iron deficiency anemia within 1 to 2 weeks.   TIME SPENT WITH THE DISCHARGE: Greater than 30 minutes.   ____________________________ Romie Jumper, MD knl:drc D: 11/22/2011 21:03:31 ET T: 11/23/2011 11:25:16 ET JOB#: [id number]  cc: Romie Jumper, MD, <Dictator> Irven Easterly. Kary Kos, MD Isaias Cowman, MD [facility]  Romie Jumper MD ELECTRONICALLY SIGNED 11/27/2011 16:27

## 2014-12-28 NOTE — H&P (Signed)
PATIENT NAME:  Brittany Nguyen, Brittany Nguyen MR#:  [id number] DATE OF BIRTH:  03/20/1966  DATE OF ADMISSION:  11/18/2011  PRIMARY CARE PHYSICIAN: [name], MD  CHIEF COMPLAINT: Chest pain.   HISTORY OF PRESENT ILLNESS: This is a 49 year old female with history of heart disease with a heart attack at age 56. She presents to the ER with chest pain described as a bear hug into the neck, shoulder and left arm area starting at 7 o'clock  p.m., 10 out of 10 in intensity, associated with sweating and shortness of breath and difficulty taking a deep breath. Now her pain is down to a 3 out of 10 in intensity. Her blood pressure was high initially. She was cooking supper at the time of this pain. Nothing made it better or worse. She does have a very stressful job. Of note, on Tuesday, she had a fever and some vomiting and diarrhea at that time. Today was the first day she started feeling a little bit better, in the morning, but then at 7 o'clock p.m. had the pain and presented to the ER. In the ER, she had a CT scan of the chest that was negative for pulmonary embolism.  PAST MEDICAL HISTORY:  1. Heart disease. 2. Restless leg syndrome.  3. Thyroid nodule.   PAST SURGICAL HISTORY:  1. Diverticular surgery.  2. Cholecystectomy.  3. Growth in the sinuses. 4. Hysterectomy.   ALLERGIES: Penicillin.   MEDICATION: Requip 4 mg at bedtime.   SOCIAL HISTORY: No smoking. No alcohol. No drug use. She works in [employer] as a Freight forwarder, very stressful job.   FAMILY HISTORY: Mother died at 47 of a myocardial infarction. Father died at 71 of heart disease and had a pacemaker.   REVIEW OF SYSTEMS: CONSTITUTIONAL: Positive for fever on Tuesday and sweating with this episode. Positive for weight loss. Positive for or fatigue. EYES: She does wear glasses. EARS, NOSE, MOUTH, AND THROAT: Positive for hearing loss on the left. Positive for hoarse voice. CARDIOVASCULAR: Positive for chest pain. No palpitations. RESPIRATORY:  Positive for shortness of breath. Positive for coughing. Positive for wheeze. GASTROINTESTINAL: On Tuesday had nausea, vomiting, diarrhea and some abdominal pain. No bright red blood per rectum. No melena. GENITOURINARY: No burning on urination or hematuria. MUSCULOSKELETAL: No joint pain or muscle pain. INTEGUMENT: No rashes or eruptions. NEUROLOGIC: No fainting or blackouts. PSYCHIATRIC: Positive for anxiety with stress. ENDOCRINE: Positive for thyroid nodule. HEMATOLOGIC/LYMPHATIC: No anemia. No easy bruising or bleeding.   PHYSICAL EXAMINATION:   CURRENT VITAL SIGNS: Temperature 96.5, pulse 86, respirations 21, blood pressure 116/66, pulse oximetry 96% on oxygen.   GENERAL: No respiratory distress.   EYES: Conjunctivae and lids normal. Pupils equal, round, and reactive to light. Extraocular muscles intact. No nystagmus.   EARS, NOSE, MOUTH, AND THROAT: Nasal mucosa no erythema. Throat no erythema. No exudate seen. Lips and gums no lesions.   NECK: No JVD. No bruits. No lymphadenopathy. No thyromegaly. No thyroid nodules palpated.   RESPIRATORY: Decreased breath sounds bilaterally. Coughing with deep breaths. Coarse breath sounds. No rhonchi, rales, or wheeze heard.   HEART: S1 and S2 normal. No gallops, rubs, or murmurs heard. Carotid upstroke 2+ bilaterally. No bruits.   EXTREMITIES: Dorsalis pedis pulses 2+ bilaterally. No edema of the lower extremities.   ABDOMEN: Soft and nontender. No organomegaly/splenomegaly. Normoactive bowel sounds. No masses felt.   LYMPHATIC: No lymph nodes in the neck.   MUSCULOSKELETAL: No clubbing, edema, or cyanosis.   SKIN: No rashes or  ulcers seen.   NEUROLOGIC: Cranial nerves II through XII grossly intact. Deep tendon reflexes 2+ bilateral lower extremities.   PSYCHIATRIC: The patient is oriented to person, place, and time.   LABS/STUDIES: Cardiac enzymes negative. INR normal range. White blood cell count 9.4, hemoglobin and hematocrit 10.3 and  31.9, and platelet count 283. Glucose 110, BUN 13, creatinine 0.95, sodium 141, potassium 3.9, chloride 105, CO2 25. Liver function tests: Total protein slightly elevated at 8.3.   EKG showed no acute changes.   D-dimer borderline at 0.94.   CT scan of the chest showed no pulmonary embolism.   ASSESSMENT AND PLAN:  1. Chest pain with history of coronary artery disease: Looking back at old records, the patient had a negative stress test in October 2011. I offered the patient the opportunity to go home. The patient felt more comfortable being admitted and having a stress test and heart enzymes being ruled out. We will give an aspirin. The patient was given Lovenox, one dose by the ER physician. This seems less likely heart disease at this point, but we will get a stress test and rule out. Admit as an observation to the observation unit and telemetry monitoring.  2. Upper respiratory tract infection: We will give a Z-Pak, Xopenex nebulizers, and a prednisone taper.  3. Restless leg syndrome: Continue Requip.  4. Thyroid nodule: Followup as an outpatient.     5. Impaired fasting glucose with a glucose of 110. Follow-up as an outpatient.  6. Anemia with a hemoglobin of 10.3: Follow-up as an outpatient.  TIME SPENT ON ADMISSION: 55 minutes.  ____________________________ Tana Conch. Leslye Peer, MD rjw:slb D: 11/18/2011 05:06:35 ET T: 11/18/2011 08:16:35 ET JOB#: [id number]  cc: Tana Conch. Leslye Peer, MD, <Dictator> Irven Easterly. Kary Kos, MD Marisue Brooklyn MD ELECTRONICALLY SIGNED 11/19/2011 19:08

## 2014-12-31 NOTE — Consult Note (Signed)
PATIENT NAME:  Brittany Nguyen, Brittany Nguyen MR#:  [id number] DATE OF BIRTH:  Sep 08, 1965  DATE OF CONSULTATION:  09/03/2014  REFERRING PHYSICIAN:  Monica Becton, MD  CONSULTING PHYSICIAN:  Dyamon Sosinski D. Clayborn Bigness, MD  CARDIOLOGIST: Corey Skains, MD  PRIMARY PHYSICIAN: Irven Easterly. Kary Kos, MD   INDICATION FOR CONSULTATION: Chest pain, near syncope, and angina.   HISTORY OF PRESENT ILLNESS: The patient is a 49 year old white female with mild obesity, hypertension, hyperlipidemia, history of non-STEMI, congestive heart failure, mild cardiomyopathy with ejection fraction between 40% and 45% presenting to the Emergency Room with chest pain symptoms. Patient states she was typing her termination letter and started having some chest pain. She had an episode of syncope which lasted about 30 seconds. EMS was called. The patient was found to be bradycardic. She was started on oxygen. The patient's heart rate improved and the patient was brought to the Emergency Room for evaluation. She had pressure-like sensation in the chest radiating to her arm. Similar to the episode when she had her non-STEMI. The patient, at baseline, is able to walk short distances. Denied any recent shortness of breath or chest pain. She has had multiple admissions for chest pain, last admission was about 3 months ago. Underwent stress test, did not show any significant abnormalities. The patient reports she is HIGHLY ALLERGIC TO CONTRAST MEDICATION so has not had a catheterization.  PAST MEDICAL HISTORY: Cardiomyopathy, systolic dysfunction, ejection fraction between 45% and 55%; obstructive sleep apnea; mild obesity; reported coronary disease; anxiety.   PAST SURGICAL HISTORY: Diverticulitis, partial colonic resection, cesarean section, appendectomy, hysterectomy, colonic resection, cesarean section, thyroid biopsy, cholecystectomy.   ALLERGIES: BETADINE, CODEINE, CONTRAST, LATEX.   HOME MEDICATION: Metoprolol 25 twice a day, Lasix 20 a day,  aspirin 81 mg a day, ropinirole 2 mg 2 tablets at bedtime.   SOCIAL HISTORY: Denies smoking or alcohol consumption. Married, lives with her husband. Works as  Freight forwarder in [employer].   FAMILY HISTORY: Both parents died of heart disease before their 18s. Father had a pacemaker.   REVIEW OF SYSTEMS: She has had a blackout spell. She has had syncope. No significant nausea, vomiting. No fever, no chills, no sweats. No weight loss, no weight gain. No hemoptysis or hematemesis. Denied bright red blood per rectum. No vision change or hearing change. Denies significant sputum production. Denies    PHYSICAL EXAMINATION: VITAL SIGNS: Blood pressure was 130/90, pulse of 100, respiratory rate 16, afebrile.  HEENT: Normocephalic, atraumatic. Pupils equal and reactive to light.  NECK: Supple. No JVD, bruits, or adenopathy.  LUNGS: Clear to auscultation and percussion. No significant wheeze, rhonchi, or rale. HEART: Regular rhythm.  ABDOMEN: Positive bowel sounds. No rebound, guarding, or tenderness.  EXTREMITY: Within normal limits.  NEUROLOGIC: Intact.  SKIN: Normal.   LABORATORY AND DIAGNOSTIC DATA: EKG: Normal sinus rhythm, nonspecific ST-T changes. White count of 10, hemoglobin of 11.6, platelet count of 296. CPK unremarkable, CK-MB 0.6.  ASSESSMENT:  1.  Chest pain.  2.  Possible angina.  3.  CAD 4.  Obesity.  5.  Cardiomyopathy. 6.  History of congestive heart failure.   7.  Restless leg syndrome.  8.  Hyperlipidemia.  9.  Possible anxiety.   PLAN:  1.  Agree with admit. Rule out for myocardial infarction. Continue anticoagulation. Continue aspirin therapy. Continue telemetry.  2.  Follow-up EKG. Rule out for myocardial infarction.  3.  Recommend functional study, Lexiscan Myoview at this point.  4.  Recommend consider echocardiogram to assess LV function.  5.  Do not recommend cardiac catheterization because of her dye allergy.  6.  Continue DVT prophylaxis.  7.  Recommend weight loss,  exercise, portion control.  8.  Episode of bradycardia, has resolved. No clear indication for her syncope, may have been vasovagal. We will continue to treat conservatively.  9.  No clear evidence of congestive heart failure.  10.  Cardiomyopathy appears to be compensated. Continue current therapy.  11.  Restless leg syndrome. Continue current medications.  12.  We will discuss the case with the cardiologist, Dr. Nehemiah Massed, and treat the patient with noninvasive workup and see how she responds.    ____________________________ Loran Senters Clayborn Bigness, MD ddc:bm D: 09/04/2014 00:26:17 ET T: 09/04/2014 02:48:19 ET JOB#: [id number]  cc: Amarise Lillo D. Clayborn Bigness, MD, <Dictator> Yolonda Kida MD ELECTRONICALLY SIGNED 09/24/2014 14:04

## 2014-12-31 NOTE — H&P (Signed)
PATIENT NAME:  Brittany Nguyen, Brittany Nguyen MR#:  [id number] DATE OF BIRTH:  07-31-1966  DATE OF ADMISSION:  09/02/2014  PRIMARY CARE PHYSICIAN:  Lovie Macadamia, MD   PRIMARY CARDIOLOGIST:  Corey Skains, MD   REFERRING PHYSICIAN:  Archie Balboa, MD   CHIEF COMPLAINT:  Chest pain, syncope.    HISTORY OF PRESENT ILLNESS:  Brittany Nguyen is a 49 year old female with history of hypertension, hyperlipidemia, previous history of non STEMI, congestive heart failure with EF of 40% to 45%, moderate obesity comes to the Emergency Department with complaints of chest pain, started at around 4:30 in the evening.  Patient was typing her termination letters, started to experience chest pain. Followed by, had an episode of syncope for about 20 to 30 seconds.  EMS was called. At the time patient was found to be bradycardic. After patient was started on oxygen, patient's heart rate improved and patient was brought to the Emergency Department where patient continued to have chest pain, pressure like pain radiating to the left arm.  This is similar to the previous episode of non ST elevation MI.  Patient at baseline walks small distances, denies experience of shortness of breath, denies having any chest pain but patient had multiple admissions for chest pain, last admission was about 3 months back, underwent a stress test, did not show any adjustable ischemia.  Patient is a HIGHLY ALLERGIC TO CONTRAST, for which reason holding the heart catheterization.   PAST MEDICAL HISTORY:  1.  Systolic congestive heart failure with EF of 45% to 55%. 2.  Obstructive sleep apnea. 3.  Morbid obesity.  4.  Coronary artery disease with a previous history of non ST elevation MI.    PAST SURGICAL HISTORY:  1.  Diverticulosis with a partial colonic resection.  2.  C-section.  3.  Appendectomy.  4.  Hysterectomy. 5.  Colon resection.   6.  C-section. 7.  Thyroid biopsy.   8.  Cholecystectomy.     ALLERGIES:   1.  BETADINE.  2.  CODEINE.  3.   CONTRAST DYE.  4.  A .  5.  LATEX.   HOME MEDICATIONS:  1.  Ropinirole 2 mg 2 tablets at bedtime.  2.  Metoprolol 25 mg 2 times a day.  3.  Lasix 20 mg daily.  4.  Aspirin 81 mg daily.   SOCIAL HISTORY: No history of smoking, drinking alcohol or using illicit drugs. 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    FAMILY HISTORY:  Both parents died from heart disease in their late 71s and 86s.  Father required pacemaker in his 55s, sister died of heart attack in her 74s.  REVIEW OF SYSTEMS:   CONSTITUTIONAL: Experiencing generalized weakness. EYES:  No change in vision.  EARS AND NOSE AND THROAT:  No change in hearing.  RESPIRATORY: Experiencing mild cough, has shortness of breath. CARDIOVASCULAR: Has chest pain.   GASTROINTESTINAL:  No nausea, vomiting, or abdominal pain.   GENITOURINARY:  No dysuria, hematuria.  ENDOCRINE:  No polyuria, or polydipsia.   HEMATOLOGIC:  No easy bruising or bleeding.   SKIN: No rash or lesions.  MUSCULOSKELETAL: No joint pains or aches, has arthritis.   NEUROLOGIC: No weakness or numbness in any part of the body.    PHYSICAL EXAMINATION:  GENERAL: This is a well-built, well-nourished moderately obese female lying down in the bed not in distress.  VITAL SIGNS: Temperature 98.2, pulse 113, blood pressure 132/94, respiratory rate of 15, oxygen  saturation 100% on room air.  HEENT: Head normocephalic, atraumatic,   clear icterus, conjunctivae normal; pupils equal and reactive to light, mucus membranes moist, no oropharyngeal erythema.  NECK:  Supple, no lymphadenopathy, no JVD, no carotid bruit.  CHEST: Has no focal tenderness.  LUNGS: Bilateral clear to auscultation.  HEART: S1, S2 regular, no murmurs are heard.   ABDOMEN: Bowel sounds present, soft, nontender, nondistended, no hepatosplenomegaly. EXTREMITIES: No pedal edema, pulses 2+. NEUROLOGIC: Patient is alert, oriented to place, person and time; cranial nerves II through XII  intact, motor 5/5 in upper and lower extremities.  DIAGNOSTIC DATA:   1.  EKG, cardiac enzymes are unremarkable.  CBC, WBC of 10.3, hemoglobin 11.6, platelet count of 296,000.    2.  CMP is completely within normal limits.  3.  CK-MB of 0.6.   ASSESSMENT AND PLAN:  Brittany Nguyen is a 49 year old female with a previous history of non ST elevation myocardial infarction, comes to the Emergency Department with complaints of chest pain and syncope.  1.  Chest pain. Patient was under significant distress giving her resignation. Could be from the spasm; however, patient has a possibility of underlying coronary artery disease as the patient had history of non ST elevation myocardial infarction.  Admit patient to the monitored bed, cycle cardiac enzymes x 3. Initial CK-MB and cardiac enzymes are negative. EKG does not show any non ST T wave abnormalities. Continue with the aspirin, add.   2.  Congestive heart failure, systolic with ejection fraction of 45% to 50%, currently seems to be euvolemic. 3.  Restless leg syndrome.  Continue the home medications.   4.  Keep the patient on deep venous thrombosis prophylaxis with Lovenox.     Time spent 50 minutes.     ____________________________ Monica Becton, MD pv:nt D: 09/02/2014 21:04:48 ET T: 09/02/2014 21:26:12 ET JOB#: [id number]  cc: Monica Becton, MD, <Dictator> Irven Easterly. Kary Kos, MD Grier Mitts Adaijah Endres MD ELECTRONICALLY SIGNED 09/13/2014 23:43

## 2015-01-04 NOTE — Discharge Summary (Signed)
PATIENT NAME:  Brittany Nguyen, Brittany Nguyen MR#:  [id number] DATE OF BIRTH:  [birth year]  DATE OF ADMISSION:  09/02/2014 DATE OF DISCHARGE:  09/04/2014  PRIMARY CARE PHYSICIAN:  Dr. Kary Kos.    CHIEF COMPLAINT: Chest pain, syncope.   ADMITTING DIAGNOSES:  1.  Chest pain.  2.  Congestive heart failure.  3.  Restless leg syndrome.   DISCHARGE DIAGNOSES:  1.  Chest pain, atypical.  2.  Syncope, probably from orthostatic hypotension.   3.  Pre-diabetes mellitus.   PROCEDURES: Lexiscan stress test on 09/04/2014.   CONSULTATIONS: Cardiology, Dr. Nehemiah Massed.   BRIEF HISTORY AND HOSPITAL COURSE: The patient is a 49 year old female who came to the ED with a chief complaint of chest pain and passing out. She has a history of congestive heart failure with an ejection fraction of 40%-45%. Please review history and physical for details. The patient is highly allergic to contrast and she had multiple admissions in the past with chest pain, last admission was 3 months prior to this. She underwent stress test that did not reveal any inducible ischemia. The patient's catheterization was held as she is highly allergic to contrast. The patient was admitted to the hospital. Cardiology is consulted. Cardiac enzymes were recycled. EKG did not reveal any ST-T wave changes. The patient was placed on ACS protocol. The patient recommended to continue her aspirin therapy and monitor her on telemetry. They recommended Lexiscan test and this was done by Dr. Ubaldo Glassing on which had revealed ejection fraction of 50%, no significant wall motion abnormality, no EKG changes concerning for ischemia, left ventricular global function was normal, apparently this was a normal study. The patient's chest pain was resolved. Her anticoagulation was discontinued as the study was normal and cardiology recommended to discharge the patient from their standpoint.  Syncope, orthostatic blood pressures were positive. Her blood pressure in lying posture was 121/80  with pulse 87, standing 117/87 with pulse 94. Ejection fraction being normal at 50%, the patient was given IV fluids 1 liter her and dizziness was resolved and started feeling better.   Regarding the pre-diabetes mellitus, the patient was seen by inpatient glycemic control nurse who has recommended her to follow up with them as an outpatient for diabetic education.   As the patient's symptoms were resolved and started feeling better decision is made to discharge the patient home.   CONDITION AT THE TIME OF DICTATION: Stable.   ACTIVITY: As tolerated.   CODE STATUS:  She is full code.   MEDICATIONS AT THE TIME OF DISCHARGE: Metoprolol tartrate 25 mg p.o. b.i.d., ropinirole 2 mg 2 tablets p.o. daily at bedtime, aspirin 81 mg once daily, Tylenol 325 mg 2 tablets p.o. 4 hours as needed for pain, leg pain, or temperature greater than 100.4.   DIET: Low-sodium, low-fat, carbohydrate controlled.   FOLLOW UP: With primary care physician in 1-2 weeks and Dr. Nehemiah Massed in 1-2 weeks.   PHYSICAL EXAMINATION:   VITAL SIGNS: On December 31 temperature 98 degrees Fahrenheit, pulse 87, respirations 18, blood pressure 131/80 with pulse 87, pulse oximetry 93% at rest on room air.  GENERAL APPEARANCE: Not in acute distress. Moderately built and obese.  HEENT: Normocephalic, atraumatic. Pupils are equal, reacting to light and accommodation. No scleral icterus. No conjunctival injection. No sinus tenderness. No postnasal drip. Moist mucous membranes.  NECK: Supple. No JVD. No thyromegaly.  Range of motion is intact.  LUNGS: Clear to auscultation bilaterally. No accessory muscle use and no anterior chest wall tenderness on palpation.  CARDIAC: S1, S2 normal. Regular rate and rhythm. No murmurs.   ABDOMEN:  Soft.  Bowel sounds are positive in all 4 quadrants. Nontender, nondistended. No masses felt.  NEUROLOGIC:  Awake, alert, oriented x 3. Cranial nerves II through XII are grossly intact. Motor and sensory are  intact. Reflexes are 2 +.  EXTREMITIES: No edema. No cyanosis. No clubbing.  PSYCHIATRIC:  Normal mood and affect.    Lexiscan final result which was performed on December 31, no significant wall motion abnormality, estimated ejection fraction 50%, pharmacological myocardial perfusion study with no significant ischemia, no EKG changes concerning for ischemia, normal study. The patient's CK total 70, CPK MB 0.6, less than 0.5 x 2, troponin less than 0.02 x 3. WBC 10.3, hemoglobin 11.6, hematocrit 36.3, platelet count was normal.    The diagnosis and plan of care were discussed in detail with the patient. She verbalized understanding of the plan. She was discharged under stable condition.      ____________________________ Nicholes Mango, MD ag:bu D: 09/11/2014 11:20:45 ET T: 09/11/2014 13:53:41 ET JOB#: [id number]  cc: Nicholes Mango, MD, <Dictator> Nicholes Mango MD ELECTRONICALLY SIGNED 09/15/2014 19:41

## 2015-01-23 IMAGING — NM NM LUNG SCAN
2 series · 16 of 16 positions shown · non-contrast
Comparison: Chest radiograph November 06, 2013

CLINICAL DATA: Chest pain and tachycardia

[Series 1000: lung ventilation · 3.90mm/px · 4 acquisitions, 8 frames shown]
[im 1/4]
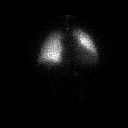
[im 1/4]
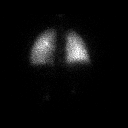
[im 2/4]
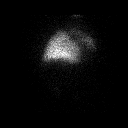
[im 2/4]
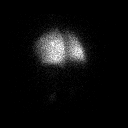
[im 3/4]
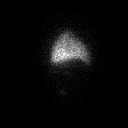
[im 3/4]
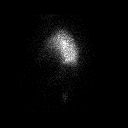
[im 4/4]
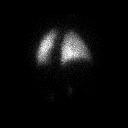
[im 4/4]
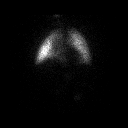

[Series 1000: lung perfusion · 1.95mm/px · 4 acquisitions, 8 frames shown]
[im 1/4]
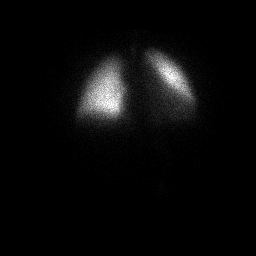
[im 1/4]
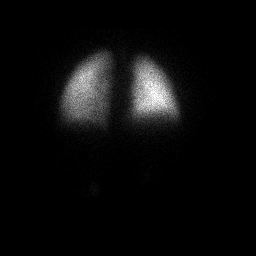
[im 2/4]
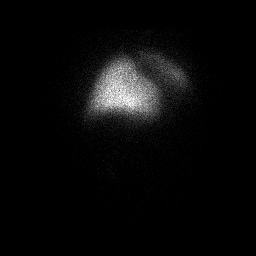
[im 2/4]
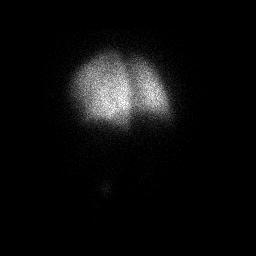
[im 3/4]
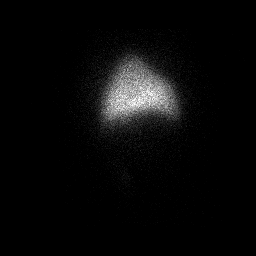
[im 3/4]
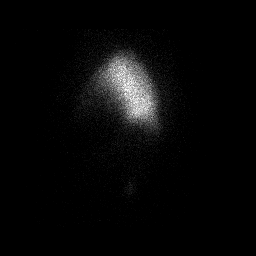
[im 4/4]
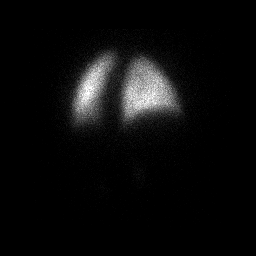
[im 4/4]
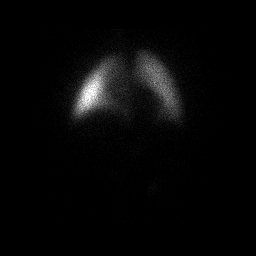

[16 of 16 positions shown; findings below may reference images not displayed]

EXAM:
NUCLEAR MEDICINE VENTILATION - PERFUSION LUNG SCAN

Views: Anterior, posterior, left lateral, right lateral, RPO, LPO,
RAO, LAO -ventilation and perfusion

Radionuclide: Technetium 99m DTPA -ventilation; Technetium 99m
macroaggregated albumin-perfusion

Dose:  43.99 mCi-ventilation; 4.23 mCi-perfusion

Route of administration: Inhalation-ventilation; intravenous
-perfusion
FINDINGS: Radiotracer uptake on the ventilation study is homogeneous and
symmetric bilaterally.

Radiotracer uptake on the perfusion study is homogeneous and
symmetric bilaterally.

There is no appreciable ventilation/perfusion mismatch.
IMPRESSION: No ventilation or perfusion defects. Very low probability of
pulmonary embolus.

## 2015-01-24 IMAGING — CR CERVICAL SPINE - 2-3 VIEW
1 series · 4 of 4 positions shown · non-contrast
Comparison: None.

CLINICAL DATA: Right sided radicular symptoms

EXAM:
CERVICAL SPINE - 2-3 VIEW

[Series 1: w cervical spine lat · 0.14mm/px · 4 of 4 slices shown]
[im 1/4]
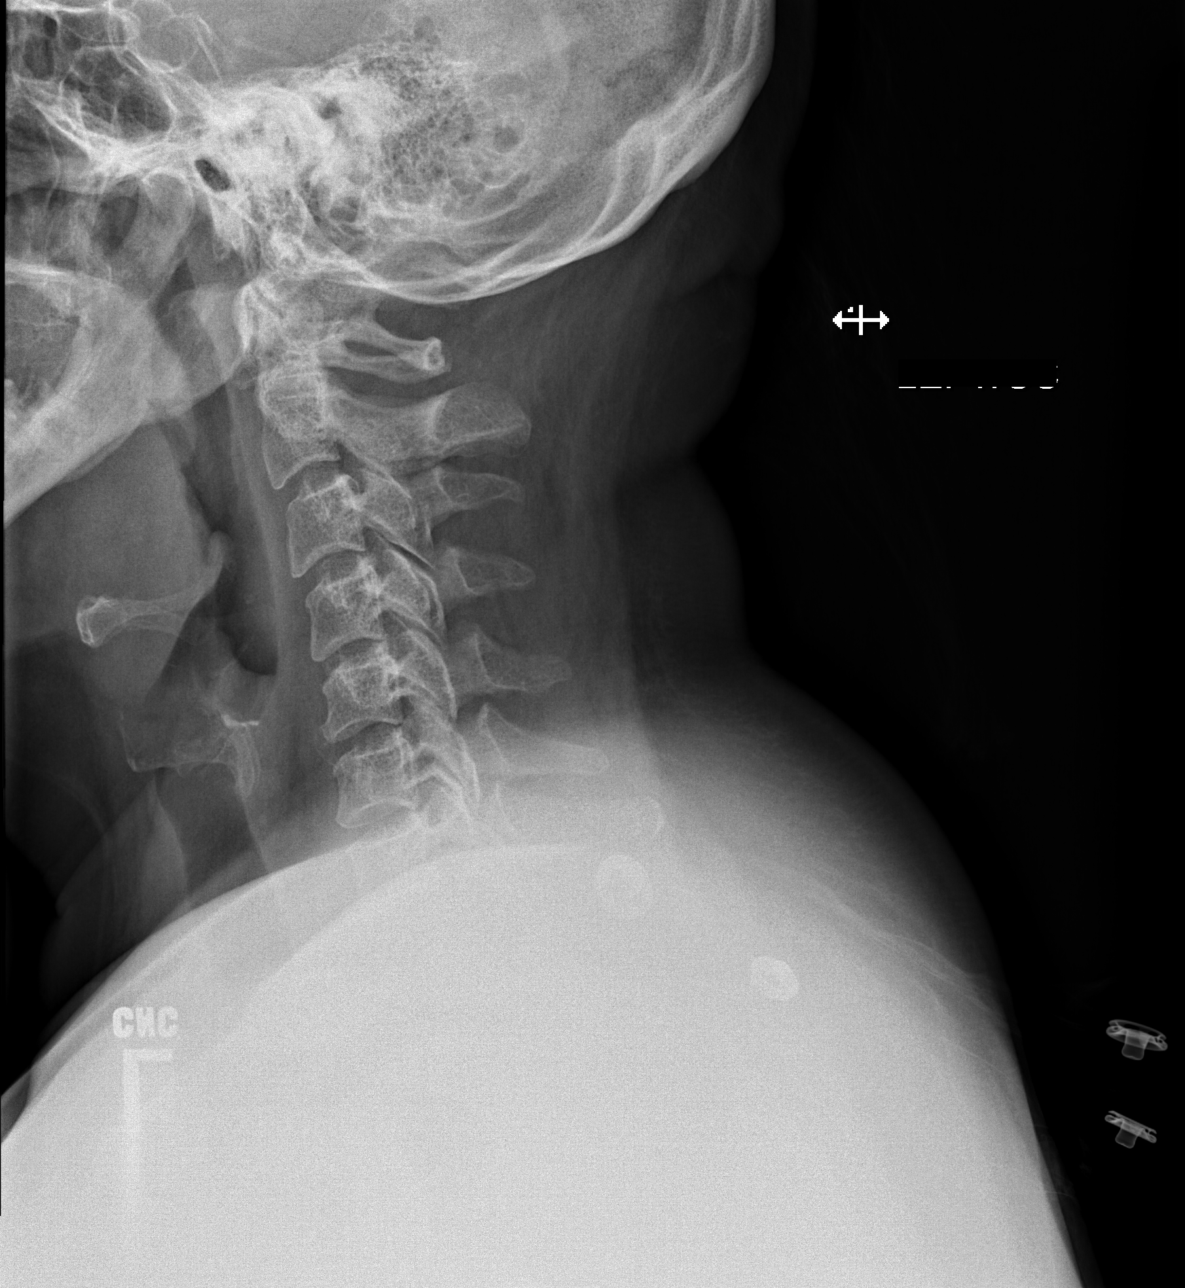
[im 2/4]
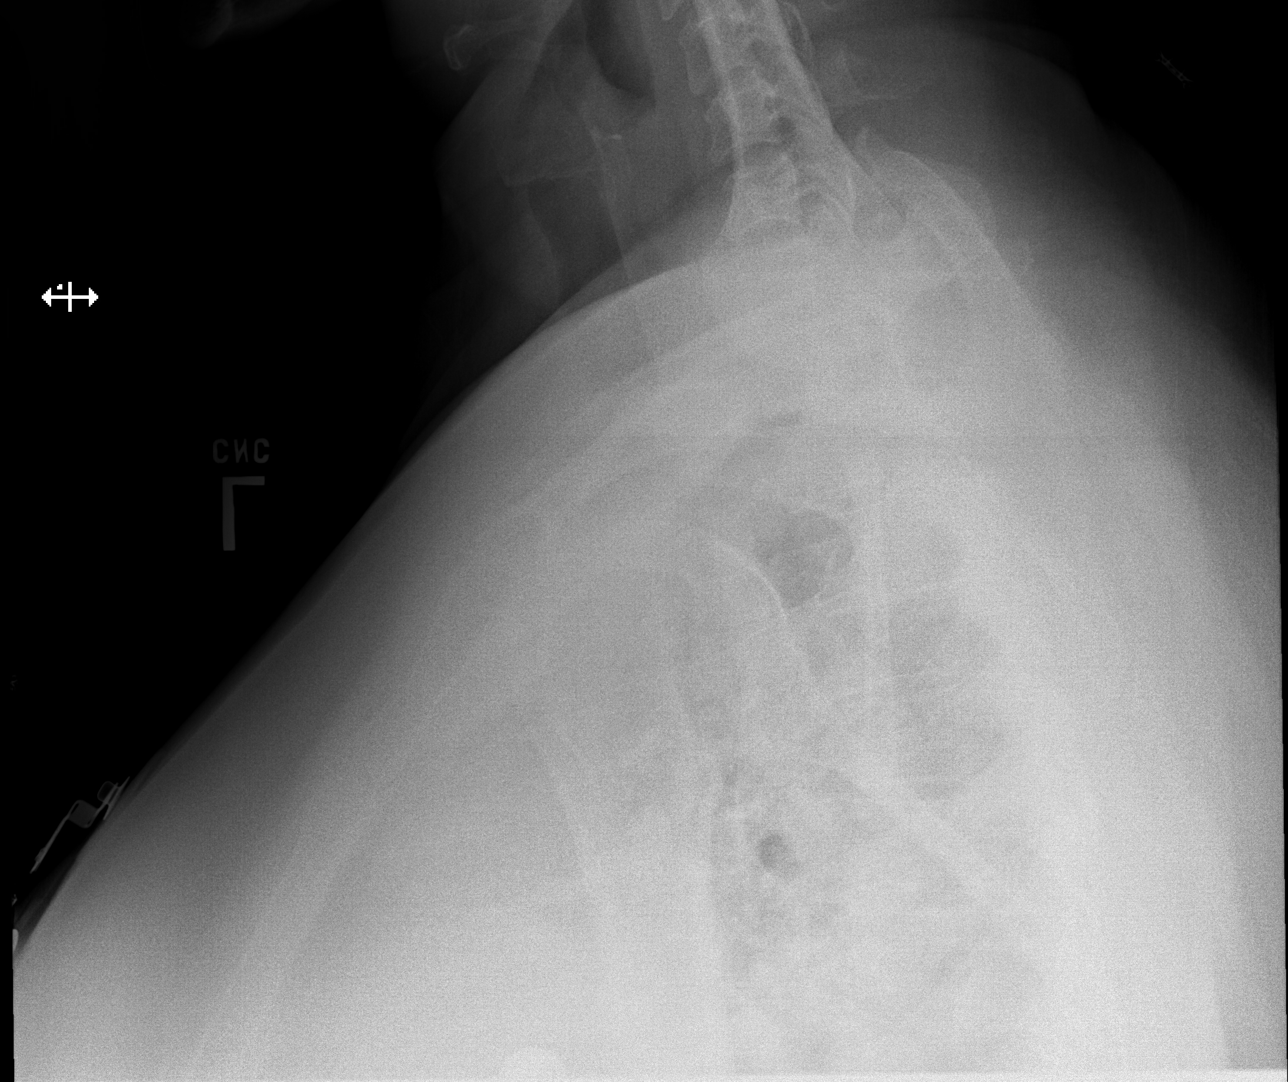
[im 3/4]
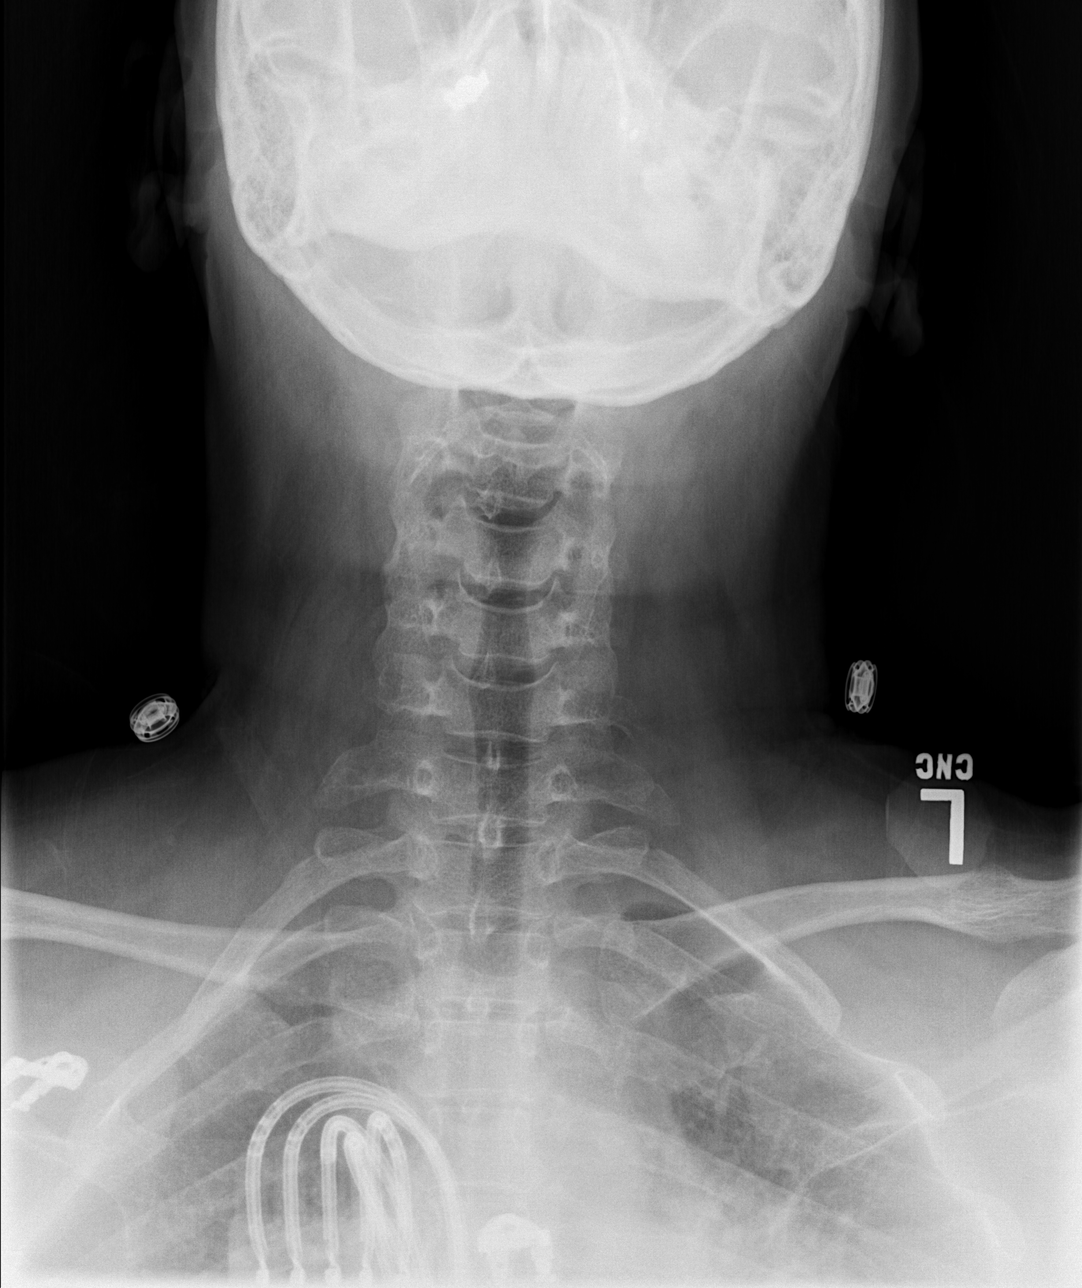
[im 4/4]
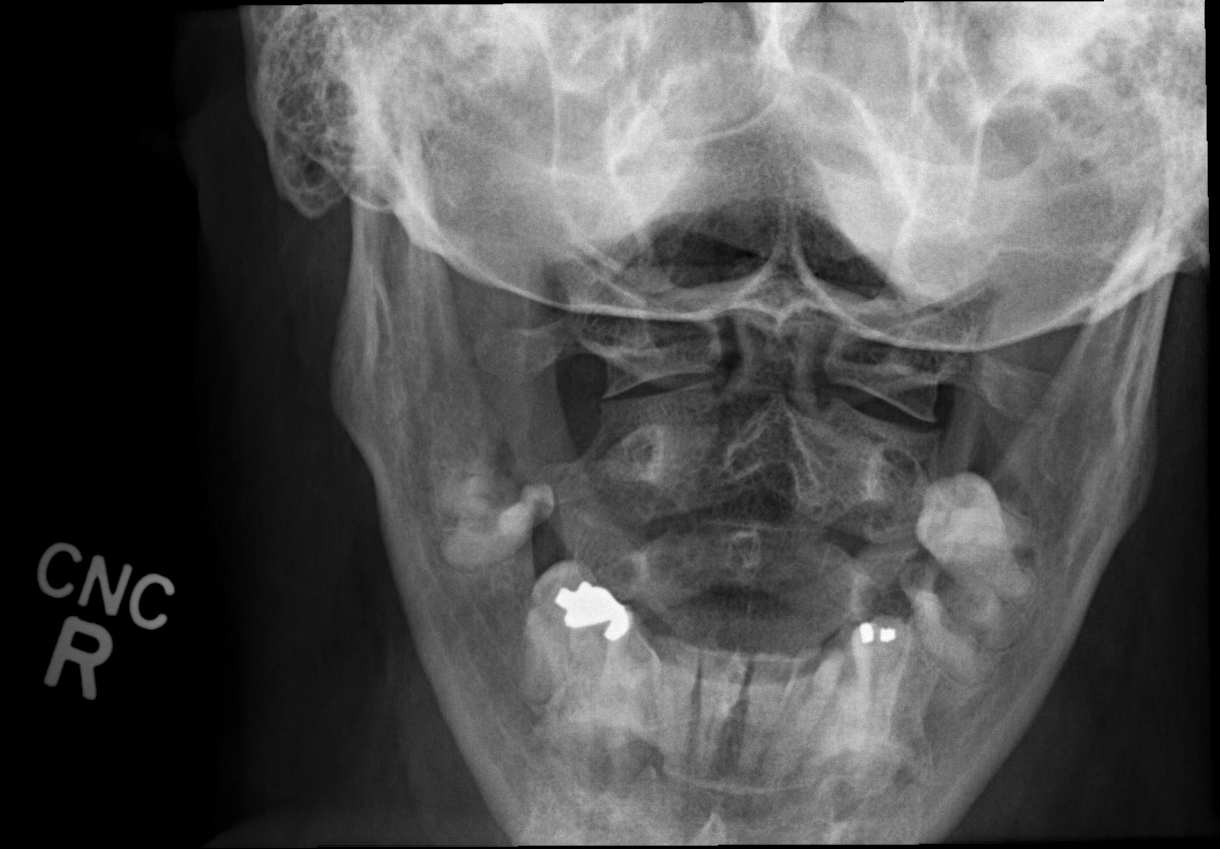

[4 of 4 positions shown; findings below may reference images not displayed]

FINDINGS: Frontal, lateral, and open-mouth odontoid images were obtained.
There is no fracture or spondylolisthesis. Prevertebral soft tissues
and predental space regions are normal. There is slight disc space
narrowing at C4-5, C5-6, and C6-7. No erosive change. There are
rudimentary cervical ribs bilaterally.
IMPRESSION: Mild osteoarthritic changes several levels. No fracture or
spondylolisthesis. Rudimentary cervical ribs bilaterally.

## 2015-01-24 IMAGING — CR DG SHOULDER 3+V*R*
1 series · 3 of 3 positions shown · non-contrast
Comparison: None.

CLINICAL DATA: Right shoulder pain and limited range of motion.

EXAM:
DG SHOULDER 3+ VIEWS RIGHT

[Series 1: w shoulder y-view right · 0.14mm/px · 3 of 3 slices shown]
[im 1/3]
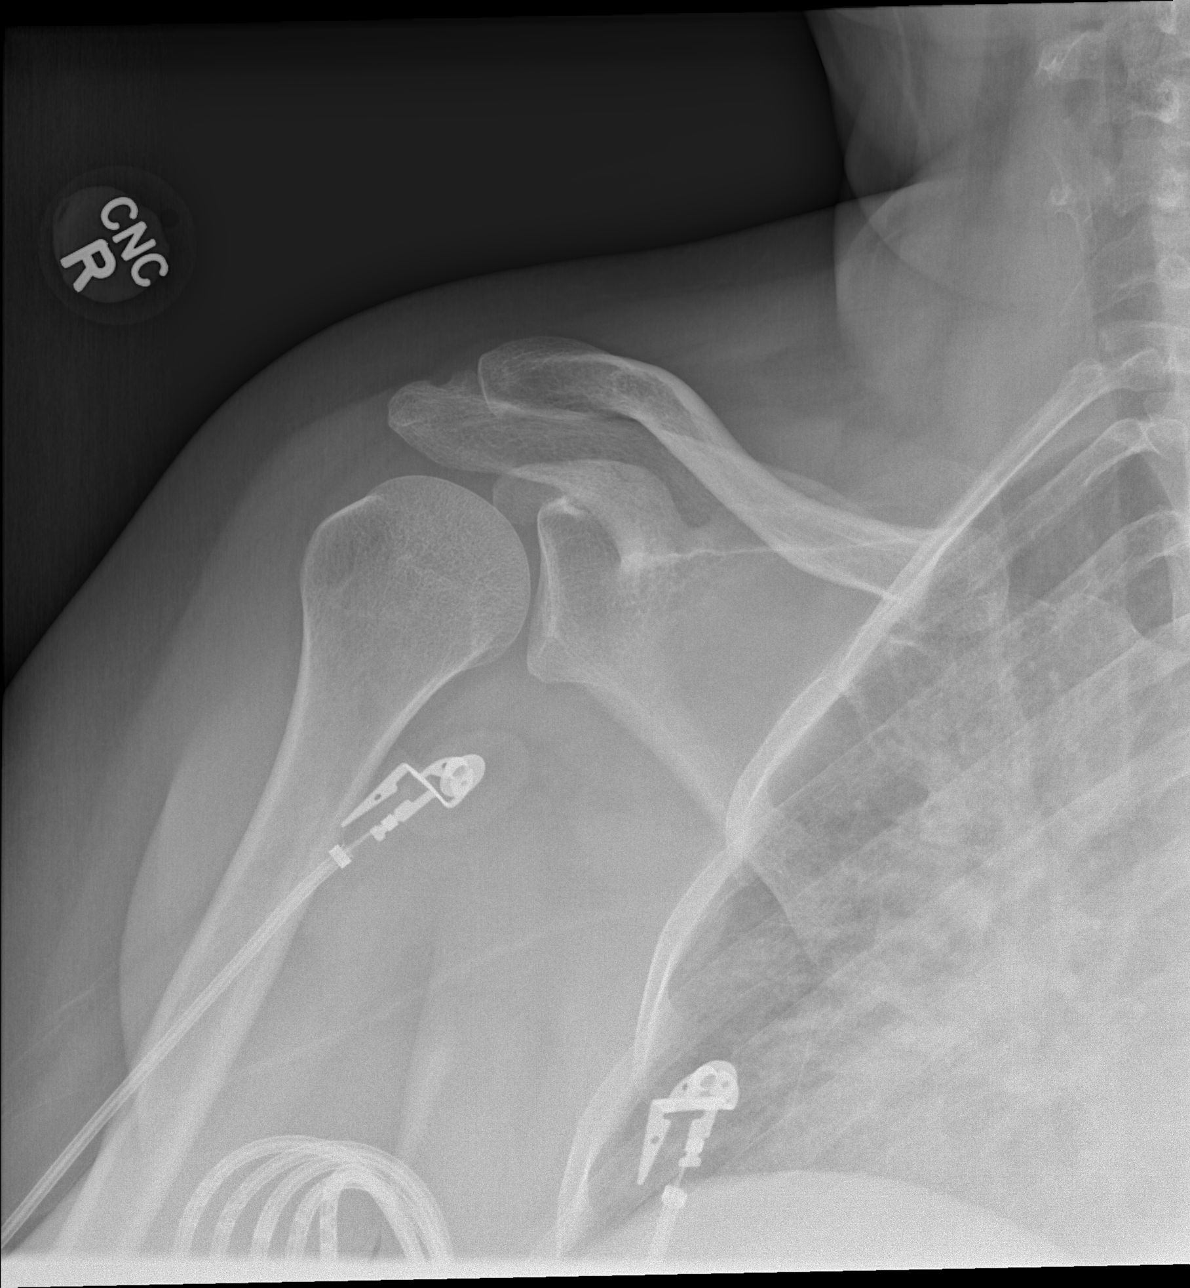
[im 2/3]
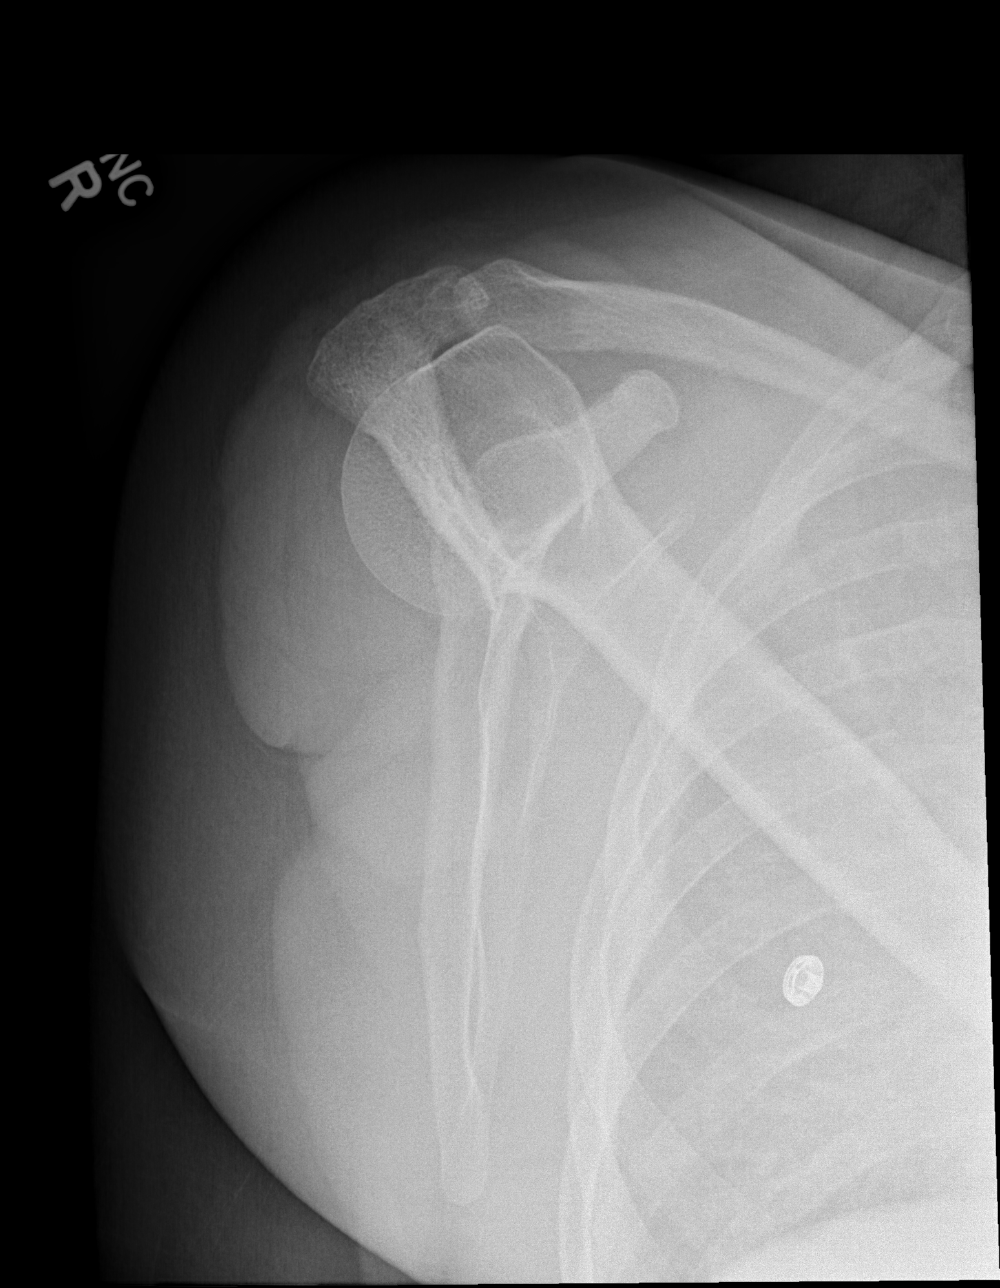
[im 3/3]
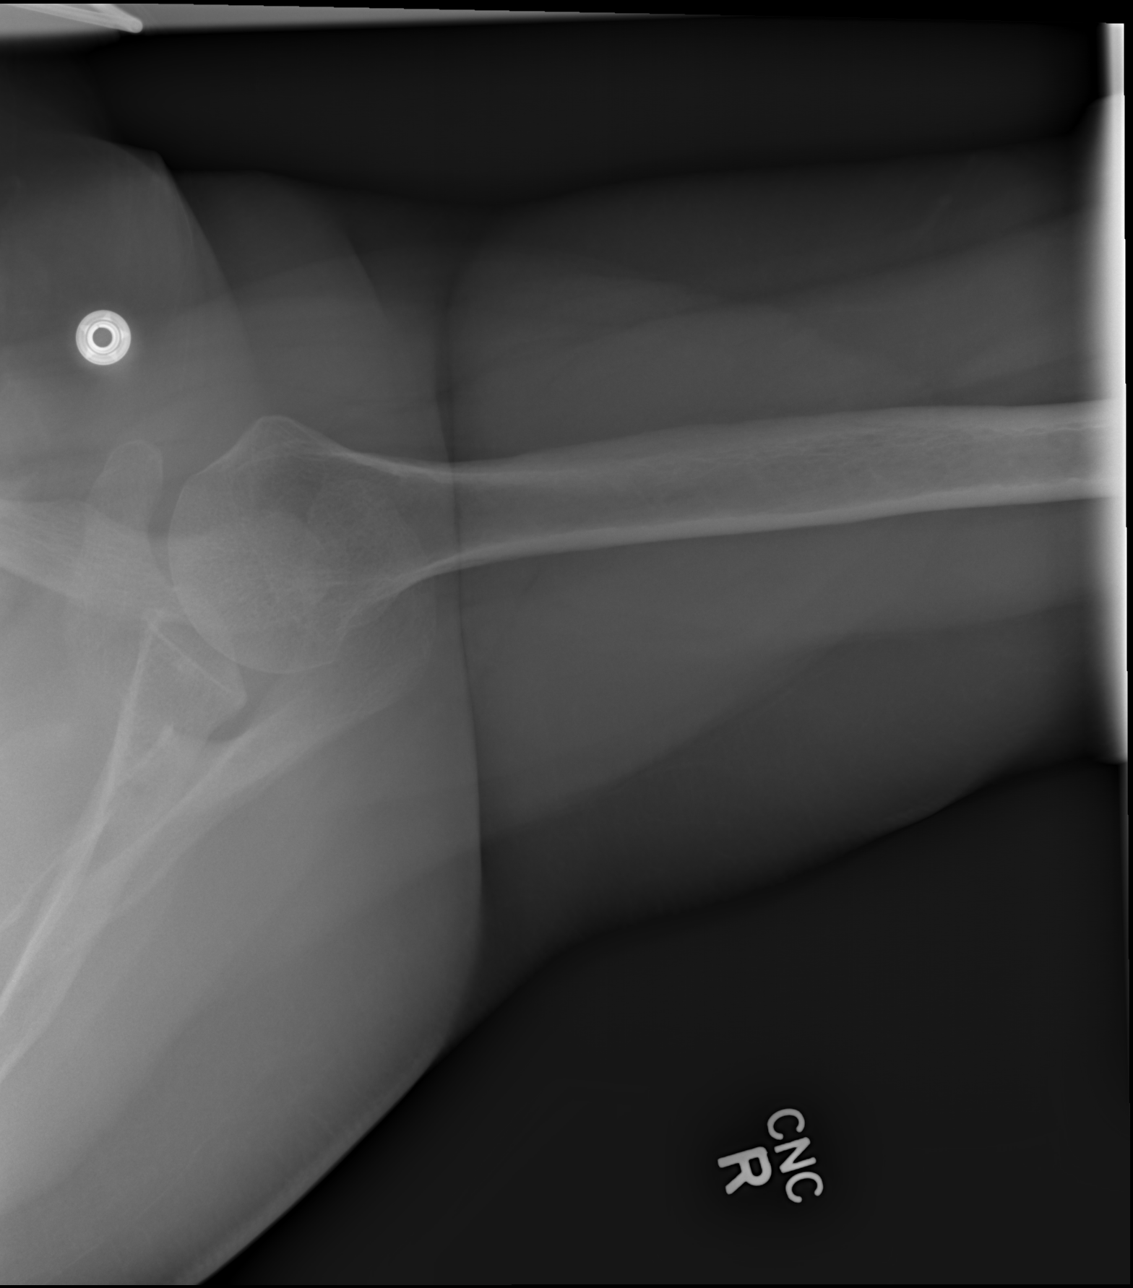

[3 of 3 positions shown; findings below may reference images not displayed]

FINDINGS: The humerus is located and the acromioclavicular joint is intact.
There is no fracture. No notable degenerative change is seen about
the shoulder. Imaged right lung and ribs appear normal.
IMPRESSION: Negative exam.

## 2015-02-04 ENCOUNTER — Encounter: Payer: Self-pay | Admitting: *Deleted

## 2015-04-09 ENCOUNTER — Emergency Department
Admission: EM | Admit: 2015-04-09 | Discharge: 2015-04-09 | Disposition: A | Discharge status: Home / Self Care | Payer: No Typology Code available for payment source | Attending: Emergency Medicine | Admitting: Emergency Medicine

## 2015-04-09 ENCOUNTER — Encounter: Payer: Self-pay | Admitting: Emergency Medicine

## 2015-04-09 DIAGNOSIS — Z79899 Other long term (current) drug therapy: Secondary | ICD-10-CM | POA: Insufficient documentation

## 2015-04-09 DIAGNOSIS — Z91040 Latex allergy status: Secondary | ICD-10-CM | POA: Diagnosis not present

## 2015-04-09 DIAGNOSIS — L089 Local infection of the skin and subcutaneous tissue, unspecified: Secondary | ICD-10-CM | POA: Diagnosis not present

## 2015-04-09 DIAGNOSIS — B9689 Other specified bacterial agents as the cause of diseases classified elsewhere: Secondary | ICD-10-CM | POA: Insufficient documentation

## 2015-04-09 DIAGNOSIS — Z880 Allergy status to penicillin: Secondary | ICD-10-CM | POA: Diagnosis not present

## 2015-04-09 DIAGNOSIS — L02211 Cutaneous abscess of abdominal wall: Secondary | ICD-10-CM | POA: Diagnosis present

## 2015-04-09 MED ORDER — SULFAMETHOXAZOLE-TRIMETHOPRIM 800-160 MG PO TABS: 1.0000 | ORAL_TABLET | Freq: Two times a day (BID) | ORAL | Status: DC

## 2015-04-09 MED ORDER — OXYCODONE-ACETAMINOPHEN 5-325 MG PO TABS
2.0000 | ORAL_TABLET | Freq: Once | ORAL | Status: AC
  Administered 2015-04-09: 2 via ORAL
  Filled 2015-04-09: qty 2

## 2015-04-09 MED ORDER — SULFAMETHOXAZOLE-TRIMETHOPRIM 800-160 MG PO TABS
1.0000 | ORAL_TABLET | Freq: Once | ORAL | Status: AC
  Administered 2015-04-09: 1 via ORAL
  Filled 2015-04-09: qty 1

## 2015-04-09 MED ORDER — OXYCODONE-ACETAMINOPHEN 7.5-325 MG PO TABS: 1.0000 | ORAL_TABLET | Freq: Four times a day (QID) | ORAL | Status: DC | PRN

## 2015-04-09 NOTE — ED Notes (Signed)
Pt with red, swollen area to lower abdominal scar from diverticulitis x3 days. Area is warm to touch, no drainage noted.

## 2015-04-09 NOTE — ED Provider Notes (Signed)
[facility] Emergency Department Provider Note  ____________________________________________  Time seen: Approximately 8:06 PM  I have reviewed the triage vital signs and the nursing notes.   HISTORY  Chief Complaint Abscess    HPI Brittany Nguyen is a 49 y.o. female claiming her wrist swollen area and send her lower abdomen. Patient is site of her discomfort is where she have a scar from a did diverticulitis surgery a couple years ago. Patient stated there is no discharge from the area but is very tender to palpation. Patient rating her pain as 7/10 describe the sharp. Patient denies any fever with this complaint. Patient stated pain seems to increase going from  sitting to a standing position.   Past Medical History  Diagnosis Date  . Hx MRSA infection   . Asthma 2013  . Heart disease 2013  . Diverticulitis 2010  . Lump or mass in breast   . Endometriosis 1990    Patient Active Problem List   Diagnosis Date Noted  . Breast pain 01/11/2013  . Hx MRSA infection   . Lump or mass in breast   . GOITER, MULTINODULAR 01/20/2009  . HYPOGLYCEMIA, UNSPECIFIED 01/20/2009  . GERD 01/20/2009  . DIVERTICULITIS OF COLON 01/20/2009    Past Surgical History  Procedure Laterality Date  . Abdominal hysterectomy      age 83  . Nasal sinus surgery  2012  . Abdominal surgery      Current Outpatient Rx  Name  Route  Sig  Dispense  Refill  . furosemide (LASIX) 20 MG tablet   Oral   Take 20 mg by mouth daily.         Marland Kitchen rOPINIRole (REQUIP) 2 MG tablet   Oral   Take 2 mg by mouth at bedtime.           Allergies Contrast media; Latex; Penicillins; and Povidone-iodine  Family History  Problem Relation Age of Onset  . Cancer Mother 30    ovarian  . Cancer Father 86    brain  . Cancer Daughter 18    skin  . Cancer Maternal Aunt 54    breast    Social History History  Substance Use Topics  . Smoking status: Never Smoker   . Smokeless  tobacco: Never Used  . Alcohol Use: No    Review of Systems Constitutional: No fever/chills Eyes: No visual changes. ENT: No sore throat. Cardiovascular: Denies chest pain. Respiratory: Denies shortness of breath. Gastrointestinal: No abdominal pain.  No nausea, no vomiting.  No diarrhea.  No constipation. Genitourinary: Negative for dysuria. Musculoskeletal: Negative for back pain. Skin: Negative for rash. Erythematous papular lesion lower central abdomen. Neurological: Negative for headaches, focal weakness or numbness. 10-point ROS otherwise negative.  ____________________________________________   PHYSICAL EXAM:  VITAL SIGNS: ED Triage Vitals  Enc Vitals Group     BP 04/09/15 1942 132/82 mmHg     Pulse Rate 04/09/15 1942 110     Resp 04/09/15 1942 16     Temp 04/09/15 1942 98.2 F (36.8 C)     Temp Source 04/09/15 1942 Oral     SpO2 04/09/15 1942 97 %     Weight 04/09/15 1942 200 lb (90.719 kg)     Height 04/09/15 1942 5\' 5"  (1.651 m)     Head Cir --      Peak Flow --      Pain Score 04/09/15 1943 7     Pain Loc --  Pain Edu? --      Excl. in [place]? --     Constitutional: Alert and oriented. Well appearing and in no acute distress. Eyes: Conjunctivae are normal. PERRL. EOMI. Head: Atraumatic. Nose: No congestion/rhinnorhea. Mouth/Throat: Mucous membranes are moist.  Oropharynx non-erythematous. Neck: No stridor. No cervical spine tenderness to palpation. Hematological/Lymphatic/Immunilogical: No cervical lymphadenopathy. }Cardiovascular: Normal rate, regular rhythm. Grossly normal heart sounds.  Good peripheral circulation. Respiratory: Normal respiratory effort.  No retractions. Lungs CTAB. Gastrointestinal: Soft and nontender. No distention. No abdominal bruits. No CVA tenderness. Musculoskeletal: No lower extremity tenderness nor edema.  No joint effusions. Neurologic:  Normal speech and language. No gross focal neurologic deficits are appreciated. No gait  instability. Skin:  Skin is warm, dry and intact. No rash noted. Erythematous papular lesion Center lower abdomen. Psychiatric: Mood and affect are normal. Speech and behavior are normal.  ____________________________________________   LABS (all labs ordered are listed, but only abnormal results are displayed)  Labs Reviewed - No data to display ____________________________________________  EKG   ____________________________________________  RADIOLOGY   ____________________________________________   PROCEDURES  Procedure(s) performed: None  Critical Care performed: No  ____________________________________________   INITIAL IMPRESSION / ASSESSMENT AND PLAN / ED COURSE  Pertinent labs & imaging results that were available during my care of the patient were reviewed by me and considered in my medical decision making (see chart for details).  Skin infection lower abdomen. ____________________________________________   FINAL CLINICAL IMPRESSION(S) / ED DIAGNOSES  Final diagnoses:  Skin infection, bacterial       Sable Feil, PA-C 04/09/15 2017  Carrie Mew, MD 04/09/15 2229

## 2015-05-14 ENCOUNTER — Emergency Department
Admission: EM | Admit: 2015-05-14 | Discharge: 2015-05-14 | Disposition: A | Discharge status: Home / Self Care | Payer: No Typology Code available for payment source | Attending: Emergency Medicine | Admitting: Emergency Medicine

## 2015-05-14 ENCOUNTER — Encounter: Payer: Self-pay | Admitting: Emergency Medicine

## 2015-05-14 DIAGNOSIS — Z91040 Latex allergy status: Secondary | ICD-10-CM | POA: Insufficient documentation

## 2015-05-14 DIAGNOSIS — G2581 Restless legs syndrome: Secondary | ICD-10-CM | POA: Insufficient documentation

## 2015-05-14 DIAGNOSIS — G4700 Insomnia, unspecified: Secondary | ICD-10-CM | POA: Insufficient documentation

## 2015-05-14 DIAGNOSIS — G4701 Insomnia due to medical condition: Secondary | ICD-10-CM

## 2015-05-14 DIAGNOSIS — Z792 Long term (current) use of antibiotics: Secondary | ICD-10-CM | POA: Insufficient documentation

## 2015-05-14 DIAGNOSIS — Z880 Allergy status to penicillin: Secondary | ICD-10-CM | POA: Insufficient documentation

## 2015-05-14 DIAGNOSIS — Z79899 Other long term (current) drug therapy: Secondary | ICD-10-CM | POA: Insufficient documentation

## 2015-05-14 HISTORY — DX: Heart failure, unspecified: I50.9

## 2015-05-14 HISTORY — DX: Restless legs syndrome: G25.81

## 2015-05-14 HISTORY — DX: Acute embolism and thrombosis of unspecified deep veins of unspecified lower extremity: I82.409

## 2015-05-14 MED ORDER — ROPINIROLE HCL 2 MG PO TABS: 2.0000 mg | ORAL_TABLET | Freq: Every day | ORAL | Status: DC

## 2015-05-14 NOTE — ED Notes (Signed)
States she has not been able to sleep d/t restless leg syndrome.  Has tried OTC meds w/o relief . Pt is very restless on arrival to ED . States insurance does not pay for her meds at present

## 2015-05-14 NOTE — ED Notes (Signed)
Pt presents to ED with c/o not being able to sleep for the past 4 days. Pt states she has felt anxious and restless. Pt reports her insurance company has stopped paying for her medication for her restless leg last week just prior to onset of her symptoms. Denies shortness of breathing or chest pain. Pt states she just wants to get some rest.

## 2015-05-14 NOTE — Discharge Instructions (Signed)
Restless Legs Syndrome Restless legs syndrome is a movement disorder. It may also be called a sensorimotor disorder.  CAUSES  No one knows what specifically causes restless legs syndrome, but it tends to run in families. It is also more common in people with low iron, in pregnancy, in people who need dialysis, and those with nerve damage (neuropathy).Some medications may make restless legs syndrome worse.Those medications include drugs to treat high blood pressure, some heart conditions, nausea, colds, allergies, and depression. SYMPTOMS Symptoms include uncomfortable sensations in the legs. These leg sensations are worse during periods of inactivity or rest. They are also worse while sitting or lying down. Individuals that have the disorder describe sensations in the legs that feel like:  Pulling.  Drawing.  Crawling.  Worming.  Boring.  Tingling.  Pins and needles.  Prickling.  Pain. The sensations are usually accompanied by an overwhelming urge to move the legs. Sudden muscle jerks may also occur. Movement provides temporary relief from the discomfort. In rare cases, the arms may also be affected. Symptoms may interfere with going to sleep (sleep onset insomnia). Restless legs syndrome may also be related to periodic limb movement disorder (PLMD). PLMD is another more common motor disorder. It also causes interrupted sleep. The symptoms from PLMD usually occur most often when you are awake. TREATMENT  Treatment for restless legs syndrome is symptomatic. This means that the symptoms are treated.   Massage and cold compresses may provide temporary relief.  Walk, stretch, or take a cold or hot bath.  Get regular exercise and a good night's sleep.  Avoid caffeine, alcohol, nicotine, and medications that can make it worse.  Do activities that provide mental stimulation like discussions, needlework, and video games. These may be helpful if you are not able to walk or stretch. Some  medications are effective in relieving the symptoms. However, many of these medications have side effects. Ask your caregiver about medications that may help your symptoms. Correcting iron deficiency may improve symptoms for some patients. Document Released: 08/12/2002 Document Revised: 01/06/2014 Document Reviewed: 11/18/2010 [employer] Patient Information 2015 [employer]. This information is not intended to replace advice given to you by your health care provider. Make sure you discuss any questions you have with your health care provider.  

## 2015-05-14 NOTE — ED Provider Notes (Signed)
[facility] Emergency Department Provider Note ____________________________________________  Time seen: Approximately 7:42 AM  I have reviewed the triage vital signs and the nursing notes.   HISTORY  Chief Complaint Insomnia  HPI Brittany Nguyen is a 49 y.o. female is here with complaint of insomnia secondary to restless leg syndrome. She states that she ran out insurance and unable to afford Requip which is what she takes. She called her doctor and was instructed to come to the emergency room. Patient states that she has been taking Requip with relief of her restless leg syndrome for many years. It was only after she had medication did she have difficulty sleeping.Currently she rates her pain as 10 out of 10. She is extremely unhappy due to insomnia.   Past Medical History  Diagnosis Date  . Hx MRSA infection   . Asthma 2013  . Heart disease 2013  . Diverticulitis 2010  . Lump or mass in breast   . Endometriosis 1990  . Restless leg   . CHF (congestive heart failure)   . DVT (deep venous thrombosis)     Patient Active Problem List   Diagnosis Date Noted  . Breast pain 01/11/2013  . Hx MRSA infection   . Lump or mass in breast   . GOITER, MULTINODULAR 01/20/2009  . HYPOGLYCEMIA, UNSPECIFIED 01/20/2009  . GERD 01/20/2009  . DIVERTICULITIS OF COLON 01/20/2009    Past Surgical History  Procedure Laterality Date  . Abdominal hysterectomy      age 84  . Nasal sinus surgery  2012  . Abdominal surgery    . Cholecystectomy    . Cesarean section      Current Outpatient Rx  Name  Route  Sig  Dispense  Refill  . furosemide (LASIX) 20 MG tablet   Oral   Take 20 mg by mouth daily.         Marland Kitchen oxyCODONE-acetaminophen (PERCOCET) 7.5-325 MG per tablet   Oral   Take 1 tablet by mouth every 6 (six) hours as needed for severe pain.   12 tablet   0   . rOPINIRole (REQUIP) 2 MG tablet   Oral   Take 1 tablet (2 mg total) by mouth at bedtime.   30  tablet   0   . sulfamethoxazole-trimethoprim (BACTRIM DS,SEPTRA DS) 800-160 MG per tablet   Oral   Take 1 tablet by mouth 2 (two) times daily.   20 tablet   0     Allergies Contrast media; Latex; Penicillins; and Povidone-iodine  Family History  Problem Relation Age of Onset  . Cancer Mother 30    ovarian  . Cancer Father 78    brain  . Cancer Daughter 18    skin  . Cancer Maternal Aunt 54    breast    Social History Social History  Substance Use Topics  . Smoking status: Never Smoker   . Smokeless tobacco: Never Used  . Alcohol Use: No    Review of Systems Constitutional: No fever/chills Eyes: No visual changes. ENT: No sore throat. Cardiovascular: Denies chest pain. Respiratory: Denies shortness of breath. Gastrointestinal: No abdominal pain.  No nausea, no vomiting.  No diarrhea.  No constipation. Genitourinary: Negative for dysuria. Musculoskeletal: Negative for back pain. Bilateral restless leg Skin: Negative for rash. Neurological: Negative for headaches, focal weakness or numbness.  10-point ROS otherwise negative.  ____________________________________________   PHYSICAL EXAM:  VITAL SIGNS: ED Triage Vitals  Enc Vitals Group     BP  05/14/15 0619 119/88 mmHg     Pulse Rate 05/14/15 0619 104     Resp 05/14/15 0619 20     Temp 05/14/15 0619 98 F (36.7 C)     Temp Source 05/14/15 0619 Oral     SpO2 05/14/15 0619 96 %     Weight 05/14/15 0619 200 lb (90.719 kg)     Height 05/14/15 0619 5\' 5"  (1.651 m)     Head Cir --      Peak Flow --      Pain Score 05/14/15 0620 10     Pain Loc --      Pain Edu? --      Excl. in [place]? --     Constitutional: Alert and oriented. Well appearing and in no acute distress. Eyes: Conjunctivae are normal. PERRL. EOMI. Head: Atraumatic. Nose: No congestion/rhinnorhea. Neck: No stridor.   Hematological/Lymphatic/Immunilogical: No cervical lymphadenopathy. Cardiovascular: Normal rate, regular rhythm. Grossly  normal heart sounds.  Good peripheral circulation. Respiratory: Normal respiratory effort.  No retractions. Lungs CTAB. Gastrointestinal: Soft and nontender. No distention. Musculoskeletal: No lower extremity tenderness nor edema.  No joint effusions. There is no gross deformity of her legs however there is moderate movement during exam with short periods of rest before Episode of constant moving Neurologic:  Normal speech and language. No gross focal neurologic deficits are appreciated. No gait instability. Skin:  Skin is warm, dry and intact. No rash noted. Psychiatric: Mood and affect are normal. Speech and behavior are normal.  ____________________________________________   LABS (all labs ordered are listed, but only abnormal results are displayed)  Labs Reviewed - No data to display  PROCEDURES  Procedure(s) performed: None  Critical Care performed: No  ____________________________________________   INITIAL IMPRESSION / ASSESSMENT AND PLAN / ED COURSE  Pertinent labs & imaging results that were available during my care of the patient were reviewed by me and considered in my medical decision making (see chart for details).  Patient was written a prescription for Requip 2 mg for 30 days. A coupon from [facility] was written. ____________________________________________   FINAL CLINICAL IMPRESSION(S) / ED DIAGNOSES  Final diagnoses:  Insomnia secondary to restless leg syndrome      Johnn Hai, PA-C 05/14/15 1625  Nena Polio, MD 05/19/15 [phone]

## 2015-05-23 ENCOUNTER — Emergency Department
Admission: EM | Admit: 2015-05-23 | Discharge: 2015-05-23 | Disposition: A | Discharge status: Home / Self Care | Payer: Self-pay | Attending: Emergency Medicine | Admitting: Emergency Medicine

## 2015-05-23 ENCOUNTER — Encounter: Payer: Self-pay | Admitting: Emergency Medicine

## 2015-05-23 ENCOUNTER — Emergency Department: Payer: Self-pay

## 2015-05-23 DIAGNOSIS — Z880 Allergy status to penicillin: Secondary | ICD-10-CM | POA: Insufficient documentation

## 2015-05-23 DIAGNOSIS — R1030 Lower abdominal pain, unspecified: Secondary | ICD-10-CM

## 2015-05-23 DIAGNOSIS — Z79899 Other long term (current) drug therapy: Secondary | ICD-10-CM | POA: Insufficient documentation

## 2015-05-23 DIAGNOSIS — N390 Urinary tract infection, site not specified: Secondary | ICD-10-CM | POA: Insufficient documentation

## 2015-05-23 LAB — COMPREHENSIVE METABOLIC PANEL
ALT: 24 U/L (ref 14–54)
ANION GAP: 9 (ref 5–15)
AST: 26 U/L (ref 15–41)
Albumin: 4.3 g/dL (ref 3.5–5.0)
Alkaline Phosphatase: 104 U/L (ref 38–126)
BILIRUBIN TOTAL: 0.4 mg/dL (ref 0.3–1.2)
BUN: 12 mg/dL (ref 6–20)
CO2: 25 mmol/L (ref 22–32)
Calcium: 9.2 mg/dL (ref 8.9–10.3)
Chloride: 107 mmol/L (ref 101–111)
Creatinine, Ser: 1 mg/dL (ref 0.44–1.00)
Glucose, Bld: 109 mg/dL — ABNORMAL HIGH (ref 65–99)
POTASSIUM: 3.4 mmol/L — AB (ref 3.5–5.1)
Sodium: 141 mmol/L (ref 135–145)
TOTAL PROTEIN: 7.8 g/dL (ref 6.5–8.1)

## 2015-05-23 LAB — CBC WITH DIFFERENTIAL/PLATELET
Basophils Absolute: 0.1 10*3/uL (ref 0–0.1)
Basophils Relative: 1 %
EOS PCT: 2 %
Eosinophils Absolute: 0.2 10*3/uL (ref 0–0.7)
HCT: 37.6 % (ref 35.0–47.0)
Hemoglobin: 12.3 g/dL (ref 12.0–16.0)
LYMPHS ABS: 2.5 10*3/uL (ref 1.0–3.6)
LYMPHS PCT: 23 %
MCH: 26 pg (ref 26.0–34.0)
MCHC: 32.8 g/dL (ref 32.0–36.0)
MCV: 79.3 fL — AB (ref 80.0–100.0)
MONO ABS: 0.7 10*3/uL (ref 0.2–0.9)
Monocytes Relative: 6 %
Neutro Abs: 7.4 10*3/uL — ABNORMAL HIGH (ref 1.4–6.5)
Neutrophils Relative %: 68 %
PLATELETS: 279 10*3/uL (ref 150–440)
RBC: 4.74 MIL/uL (ref 3.80–5.20)
RDW: 16.4 % — AB (ref 11.5–14.5)
WBC: 10.8 10*3/uL (ref 3.6–11.0)

## 2015-05-23 LAB — URINALYSIS COMPLETE WITH MICROSCOPIC (ARMC ONLY)
BILIRUBIN URINE: NEGATIVE
Glucose, UA: NEGATIVE mg/dL
Hgb urine dipstick: NEGATIVE
KETONES UR: NEGATIVE mg/dL
NITRITE: NEGATIVE
PH: 5 (ref 5.0–8.0)
Protein, ur: NEGATIVE mg/dL
SPECIFIC GRAVITY, URINE: 1.026 (ref 1.005–1.030)

## 2015-05-23 LAB — LIPASE, BLOOD: LIPASE: 24 U/L (ref 22–51)

## 2015-05-23 MED ORDER — DIPHENHYDRAMINE HCL 50 MG/ML IJ SOLN
25.0000 mg | Freq: Once | INTRAMUSCULAR | Status: AC
  Administered 2015-05-23: 25 mg via INTRAVENOUS

## 2015-05-23 MED ORDER — MORPHINE SULFATE (PF) 4 MG/ML IV SOLN
4.0000 mg | Freq: Once | INTRAVENOUS | Status: AC
Start: 2015-05-23 — End: 2015-05-23
  Administered 2015-05-23: 4 mg via INTRAVENOUS

## 2015-05-23 MED ORDER — ONDANSETRON HCL 4 MG/2ML IJ SOLN
4.0000 mg | Freq: Once | INTRAMUSCULAR | Status: AC
  Administered 2015-05-23: 4 mg via INTRAVENOUS
  Filled 2015-05-23: qty 2

## 2015-05-23 MED ORDER — ONDANSETRON HCL 4 MG PO TABS: 4.0000 mg | ORAL_TABLET | Freq: Every day | ORAL | Status: DC | PRN

## 2015-05-23 MED ORDER — MORPHINE SULFATE (PF) 2 MG/ML IV SOLN
INTRAVENOUS | Status: AC
  Filled 2015-05-23: qty 2

## 2015-05-23 MED ORDER — CIPROFLOXACIN HCL 500 MG PO TABS: 500.0000 mg | ORAL_TABLET | Freq: Two times a day (BID) | ORAL | Status: AC

## 2015-05-23 MED ORDER — SODIUM CHLORIDE 0.9 % IV SOLN
Freq: Once | INTRAVENOUS | Status: AC
  Administered 2015-05-23: 20:00:00 via INTRAVENOUS

## 2015-05-23 MED ORDER — DIPHENHYDRAMINE HCL 50 MG/ML IJ SOLN
INTRAMUSCULAR | Status: AC
  Filled 2015-05-23: qty 1

## 2015-05-23 MED ORDER — MORPHINE SULFATE (PF) 4 MG/ML IV SOLN
6.0000 mg | Freq: Once | INTRAVENOUS | Status: AC
  Administered 2015-05-23: 6 mg via INTRAVENOUS
  Filled 2015-05-23: qty 2

## 2015-05-23 MED ORDER — CIPROFLOXACIN HCL 500 MG PO TABS
500.0000 mg | ORAL_TABLET | Freq: Once | ORAL | Status: AC
  Administered 2015-05-23: 500 mg via ORAL
  Filled 2015-05-23: qty 1

## 2015-05-23 MED ORDER — OXYCODONE-ACETAMINOPHEN 5-325 MG PO TABS: 1.0000 | ORAL_TABLET | Freq: Four times a day (QID) | ORAL | Status: DC | PRN

## 2015-05-23 NOTE — ED Notes (Signed)
Redness and itching dissipated and pt states feeling better

## 2015-05-23 NOTE — ED Notes (Signed)
Patient transported to CT at this time. 

## 2015-05-23 NOTE — ED Notes (Signed)
Called to room re:  Patient reddened and itching to chest and abdomen.  Dr. Burlene Arnt alerted and to bedside to evaluate.  Benadryl given per order.

## 2015-05-23 NOTE — ED Notes (Signed)
lab called regarding urine culture.

## 2015-05-23 NOTE — ED Notes (Signed)
MD Mcshane at bedside. 

## 2015-05-23 NOTE — ED Notes (Signed)
Pt returned from CT °

## 2015-05-23 NOTE — ED Notes (Signed)
Nausea without vomiting

## 2015-05-23 NOTE — ED Provider Notes (Addendum)
[facility] Emergency Department Provider Note  ____________________________________________  Time seen: Approximately 8:49 PM  I have reviewed the triage vital signs and the nursing notes.   HISTORY  Chief Complaint Abdominal Pain    HPI Ellagrace Yoshida is a 49 y.o. female with a history of diverticulitis and heart failure. Patient has had a history of partial bowel resection as a result of this. This was through 4 years ago. She also has had a total hysterectomy, appendectomy, and cholecystectomy in the past. She says last 3 or 4 days she's had some nausea and left lower quadrant abdominal pain some which were her prior diverticular disease.  no fever. The pain is gradually getting worse.She does state that she's had some loose stools as well and mild dysuria  Past Medical History  Diagnosis Date  . Hx MRSA infection   . Asthma 2013  . Heart disease 2013  . Diverticulitis 2010  . Lump or mass in breast   . Endometriosis 1990  . Restless leg   . CHF (congestive heart failure)   . DVT (deep venous thrombosis)     Patient Active Problem List   Diagnosis Date Noted  . Breast pain 01/11/2013  . Hx MRSA infection   . Lump or mass in breast   . GOITER, MULTINODULAR 01/20/2009  . HYPOGLYCEMIA, UNSPECIFIED 01/20/2009  . GERD 01/20/2009  . DIVERTICULITIS OF COLON 01/20/2009    Past Surgical History  Procedure Laterality Date  . Abdominal hysterectomy      age 59  . Nasal sinus surgery  2012  . Abdominal surgery    . Cholecystectomy    . Cesarean section      Current Outpatient Rx  Name  Route  Sig  Dispense  Refill  . furosemide (LASIX) 20 MG tablet   Oral   Take 20 mg by mouth daily.         Marland Kitchen oxyCODONE-acetaminophen (PERCOCET) 7.5-325 MG per tablet   Oral   Take 1 tablet by mouth every 6 (six) hours as needed for severe pain.   12 tablet   0   . rOPINIRole (REQUIP) 2 MG tablet   Oral   Take 1 tablet (2 mg total) by mouth at  bedtime.   30 tablet   0   . sulfamethoxazole-trimethoprim (BACTRIM DS,SEPTRA DS) 800-160 MG per tablet   Oral   Take 1 tablet by mouth 2 (two) times daily.   20 tablet   0     Allergies Contrast media; Latex; Penicillins; and Povidone-iodine  Family History  Problem Relation Age of Onset  . Cancer Mother 30    ovarian  . Cancer Father 61    brain  . Cancer Daughter 18    skin  . Cancer Maternal Aunt 16    breast    Social History Social History  Substance Use Topics  . Smoking status: Never Smoker   . Smokeless tobacco: Never Used  . Alcohol Use: No    Review of Systems Constitutional: No fever/chills Eyes: No visual changes. ENT: No sore throat. Cardiovascular: Denies chest pain. Respiratory: Denies shortness of breath. Gastrointestinal: Positive abdominal pain.  Positive nausea, no vomiting.  No diarrhea.  No constipation. Genitourinary: Positive for mild dysuria. Musculoskeletal: Negative for back pain. Skin: Negative for rash. Neurological: Negative for headaches, focal weakness or numbness. 10-point ROS otherwise negative.  ____________________________________________   PHYSICAL EXAM:  VITAL SIGNS: ED Triage Vitals  Enc Vitals Group     BP  05/23/15 1734 114/72 mmHg     Pulse Rate 05/23/15 1734 110     Resp 05/23/15 1734 20     Temp 05/23/15 1734 98.2 F (36.8 C)     Temp Source 05/23/15 1734 Oral     SpO2 05/23/15 1734 98 %     Weight 05/23/15 1734 200 lb (90.719 kg)     Height 05/23/15 1734 5\' 5"  (1.651 m)     Head Cir --      Peak Flow --      Pain Score 05/23/15 1736 9     Pain Loc --      Pain Edu? --      Excl. in [place]? --     Constitutional: Alert and oriented. Well appearing and in no acute distress. Appears uncomfortable mildly Eyes: Conjunctivae are normal. PERRL. EOMI. Head: Atraumatic. Nose: No congestion/rhinnorhea. Mouth/Throat: Mucous membranes are moist.  Oropharynx non-erythematous. Neck: No stridor.   Cardiovascular:  Normal rate, regular rhythm. Grossly normal heart sounds.  Good peripheral circulation. Respiratory: Normal respiratory effort.  No retractions. Lungs CTAB. Gastrointestinal: Orbital obesity noted, left lower quadrant tenderness noted, no guarding or rebound abdomen is soft. No distention. No abdominal bruits. No CVA tenderness.  Musculoskeletal: No lower extremity tenderness nor edema.  No joint effusions. Neurologic:  Normal speech and language. No gross focal neurologic deficits are appreciated. No gait instability. Skin:  Skin is warm, dry and intact. No rash noted. Psychiatric: Mood and affect are normal. Speech and behavior are normal.  ____________________________________________   LABS (all labs ordered are listed, but only abnormal results are displayed)  Labs Reviewed  CBC WITH DIFFERENTIAL/PLATELET - Abnormal; Notable for the following:    MCV 79.3 (*)    RDW 16.4 (*)    Neutro Abs 7.4 (*)    All other components within normal limits  COMPREHENSIVE METABOLIC PANEL - Abnormal; Notable for the following:    Potassium 3.4 (*)    Glucose, Bld 109 (*)    All other components within normal limits  URINALYSIS COMPLETEWITH MICROSCOPIC ([facility] ONLY) - Abnormal; Notable for the following:    Color, Urine RED (*)    APPearance TURBID (*)    Leukocytes, UA 1+ (*)    Bacteria, UA MANY (*)    Squamous Epithelial / LPF 0-5 (*)    All other components within normal limits  LIPASE, BLOOD   ____________________________________________  EKG   ____________________________________________  RADIOLOGY  ____________________________________________   PROCEDURES  Procedure(s) performed: None  Critical Care performed: None  ____________________________________________   INITIAL IMPRESSION / ASSESSMENT AND PLAN / ED COURSE  Pertinent labs & imaging results that were available during my care of the patient were reviewed by me and considered in my medical decision making (see  chart for details).  Patient with left lower quadrant pain history of diverticulitis. Given her history of diverticulitis, that is most likely what the pathology is however, we will obtain a CT to verify. Fortunately, patient does not have a gallbladder, penicillin or ovaries or uterus, which of course greatly limits our differential. Also on the differential would be a anastomosis dehiscence or abscess. We did give the patient morphine after the morphine, she was complaining of itching but there was no hives and no true allergic reaction. We did if her Benadryl and she feels better. Her pain is much better. Continue to watch her closely in the emergency department. Blood work is noted, CT is pending.  ----------------------------------------- 9:36 PM on 05/23/2015 -----------------------------------------  Patient's abdomen is non-surgical. She says mild discomfort in the left lower quadrant but no guarding or rebound and she feels much better. Her pain is well-controlled and she has no significant tenderness. Her CT scan, blood work and vital signs are reassuring. She has no elevated white count. There is no evidence of diverticular disease. I am concerned however about her mild dysuria and her urinalysis. There is a chance that this certainly could be a developing. There is some limitation to physical exam on this patient given her morbid obesity. We will therefore institute a urine culture and I will administer antibiotics for possible UTI. Patient is tolerating by mouth and in no acute distress here. We'll send her with pain medications and nausea medications extensive return precautions. She voices understanding and agreement with all this plan. ____________________________________________   FINAL CLINICAL IMPRESSION(S) / ED DIAGNOSES  Final diagnoses:  None     Schuyler Amor, MD 05/23/15 2051  Schuyler Amor, MD 05/23/15 2137

## 2015-05-25 LAB — URINE CULTURE: CULTURE: NO GROWTH

## 2015-05-28 IMAGING — CR DG CHEST 2V
1 series · 2 of 2 positions shown · non-contrast
Comparison: 11/06/2013

CLINICAL DATA: Left upper chest pain for 4 hr. Radiating to the
back and left arm.

EXAM:
CHEST  2 VIEW

[Series 1: w chest pa · 0.14mm/px · 2 of 2 slices shown]
[im 1/2]
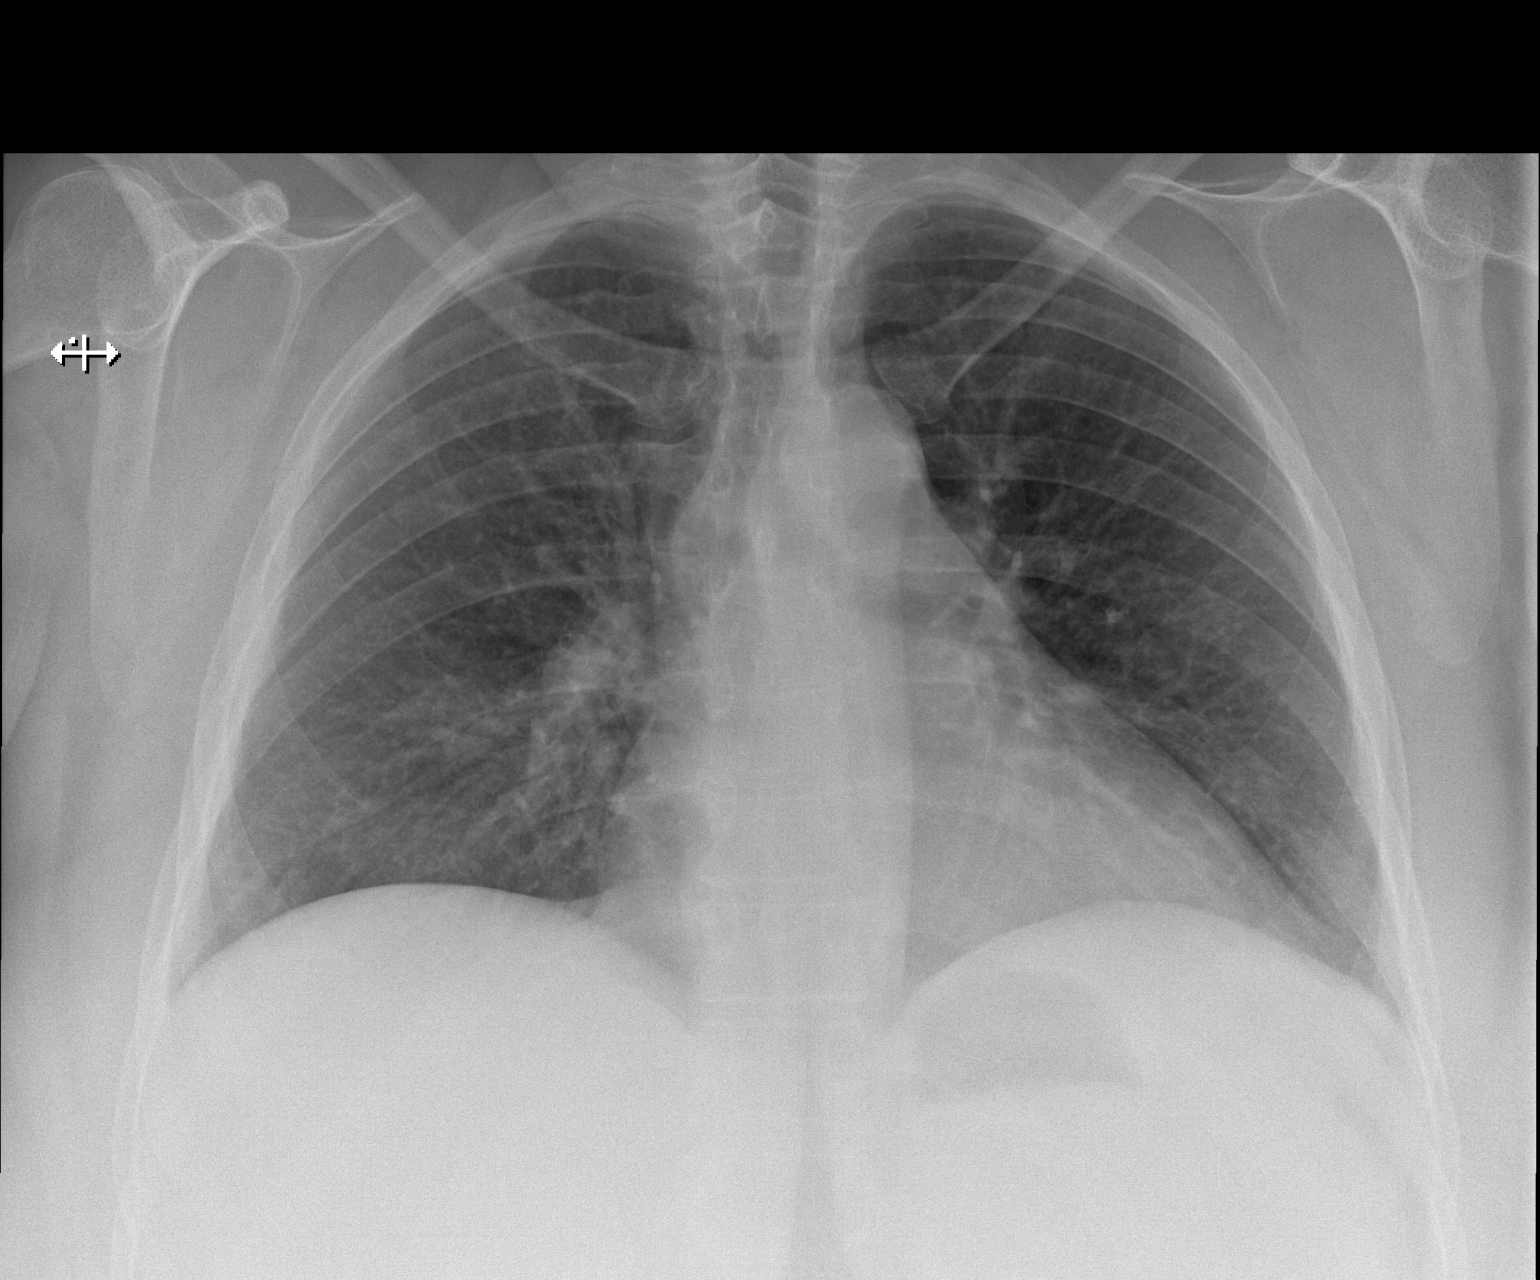
[im 2/2]
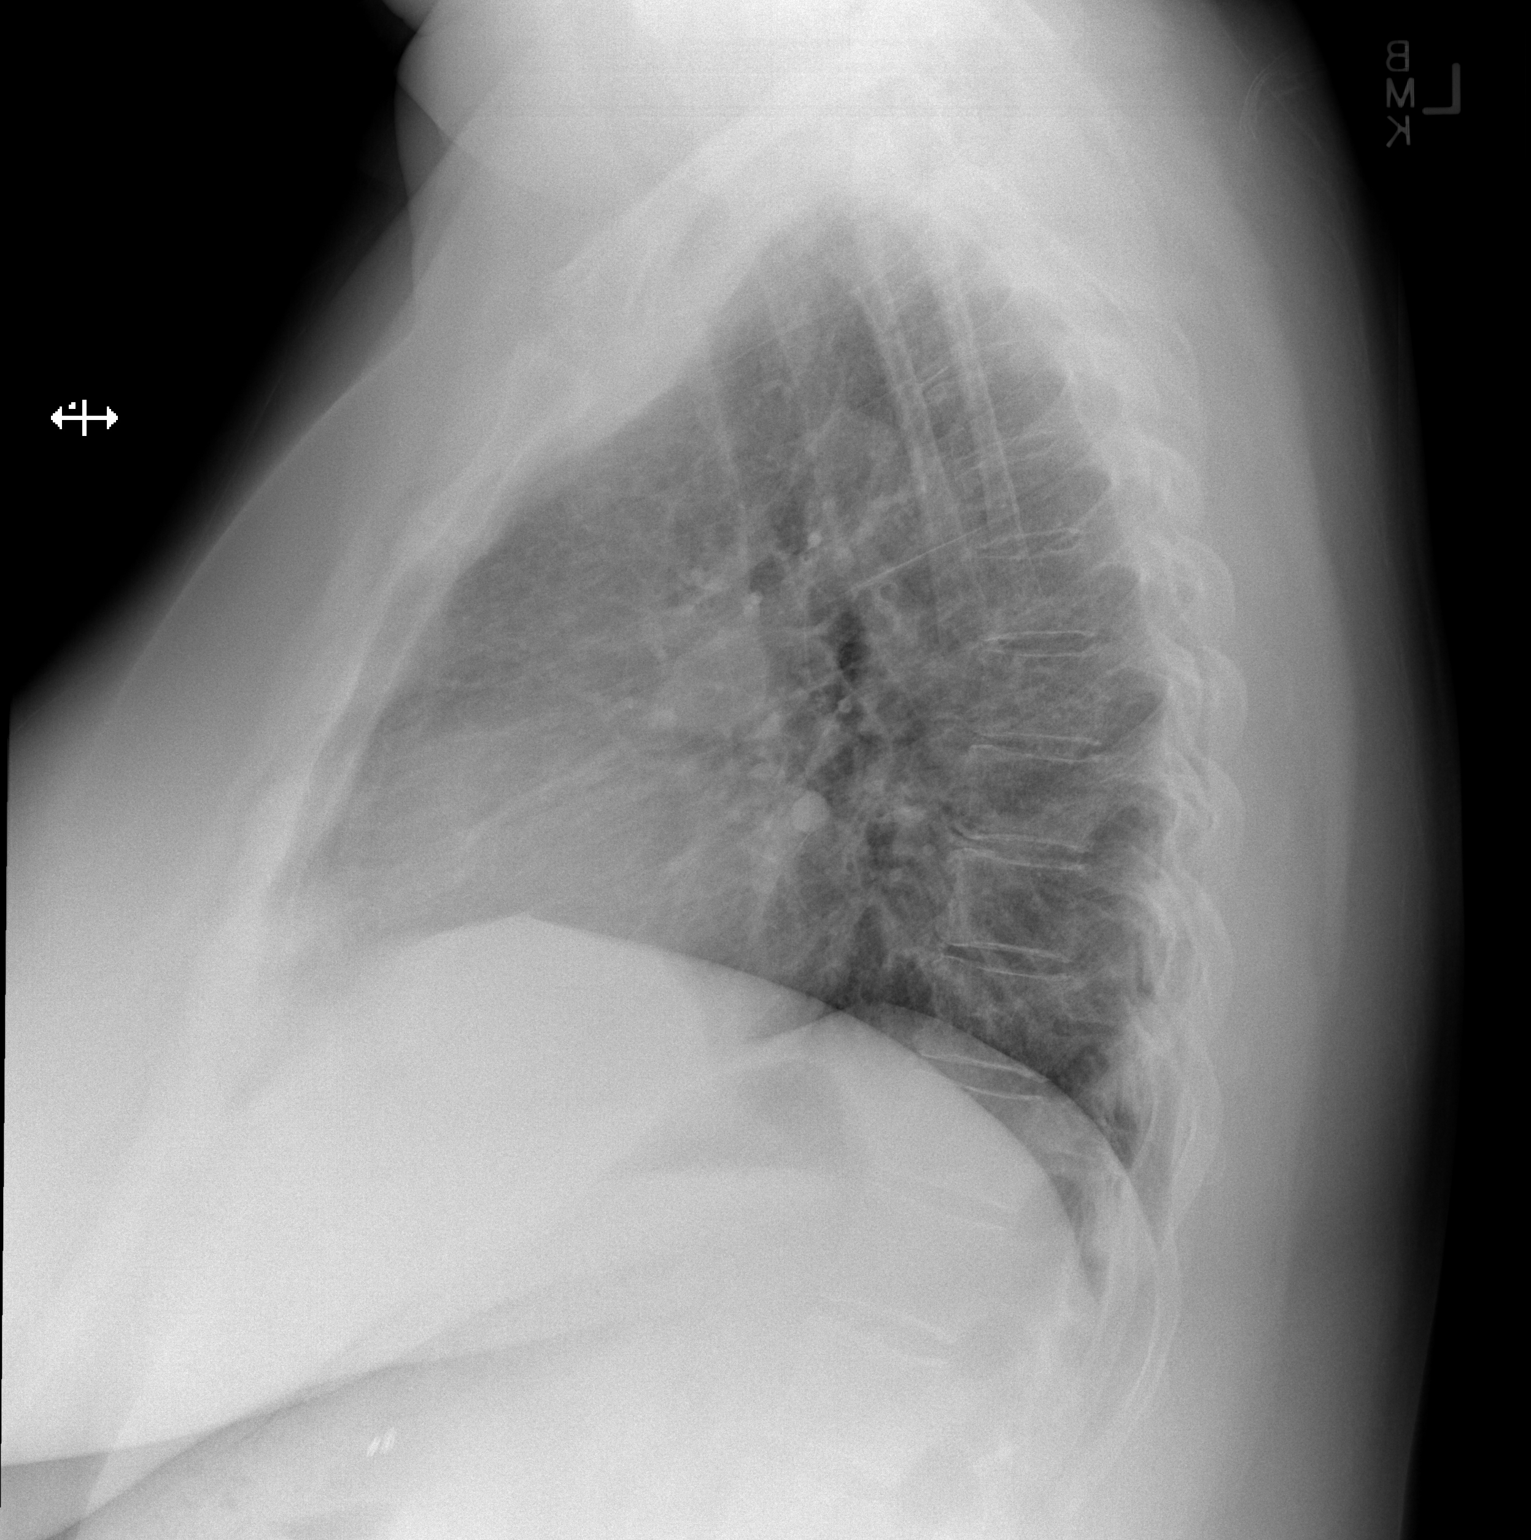

[2 of 2 positions shown; findings below may reference images not displayed]

FINDINGS: The heart size and mediastinal contours are within normal limits.
Both lungs are clear. The visualized skeletal structures are
unremarkable.
IMPRESSION: No active cardiopulmonary disease.

## 2015-07-01 IMAGING — CR DG CHEST 1V PORT
1 series · 1 of 1 positions shown · non-contrast
Comparison: PA and lateral chest 03/12/2014 and 07/13/2013.

CLINICAL DATA: Cough and shortness of breath.

EXAM:
PORTABLE CHEST - 1 VIEW

[ap]
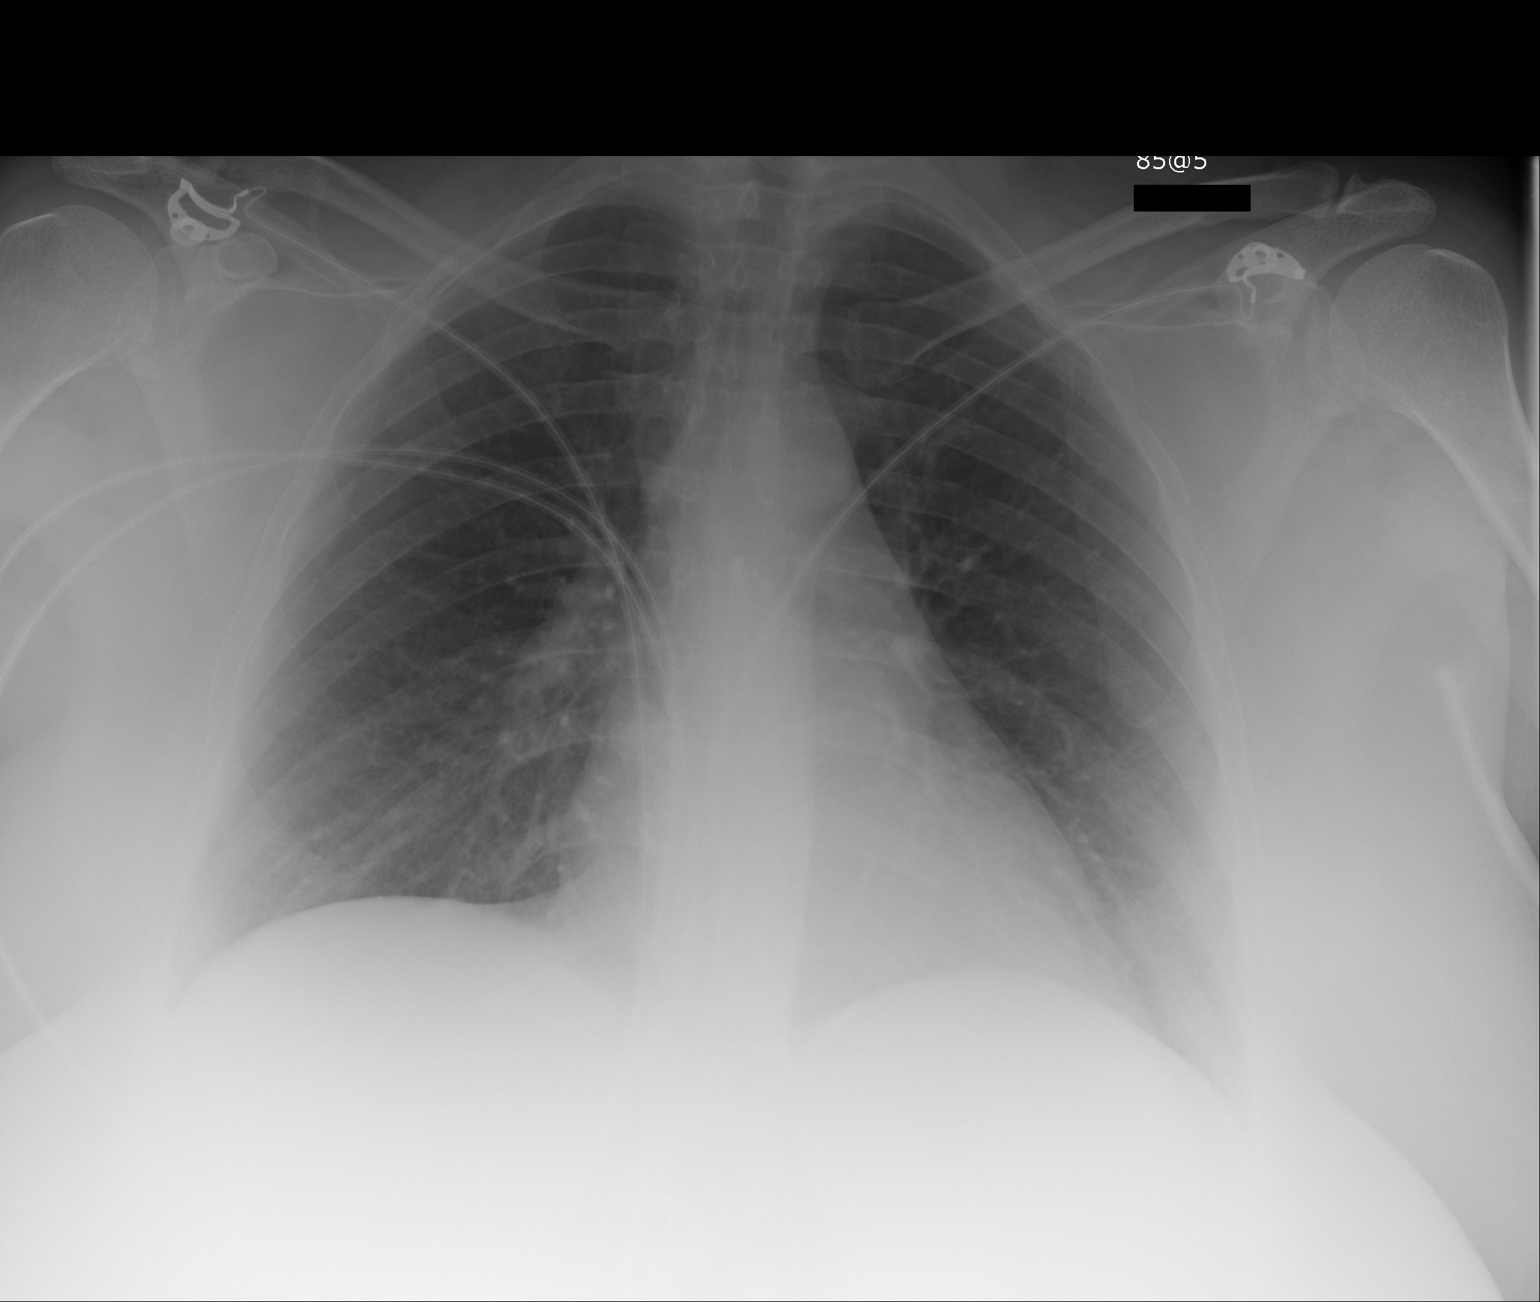

[1 of 1 positions shown; findings below may reference images not displayed]

FINDINGS: The lungs are clear. Heart size is normal. No pneumothorax or
pleural effusion. No focal bony abnormality.
IMPRESSION: Negative chest.

## 2015-07-01 IMAGING — NM NM LUNG SCAN
2 series · 16 of 16 positions shown · non-contrast
Comparison: Chest x-ray dated [DATE] and lung scan dated
11/07/2013

CLINICAL DATA: Pulmonary chest pain.  Tachycardia.

EXAM:
NUCLEAR MEDICINE VENTILATION - PERFUSION LUNG SCAN
TECHNIQUE: Ventilation images were obtained in multiple projections using
inhaled aerosol technetium 99 M DTPA. Perfusion images were obtained
in multiple projections after intravenous injection of Cc-OOm MAA.
RADIOPHARMACEUTICALS:  39.96 mCi Cc-OOm DTPA aerosol and 4.356 mCi
Cc-OOm MAA

[Series 1000: lung ventilation · 3.90mm/px · 4 acquisitions, 8 frames shown]
[im 1/4]
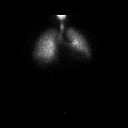
[im 1/4]
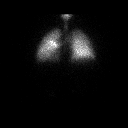
[im 2/4]
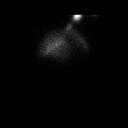
[im 2/4]
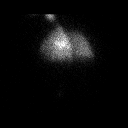
[im 3/4]
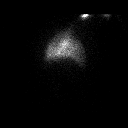
[im 3/4]
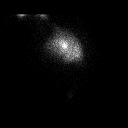
[im 4/4]
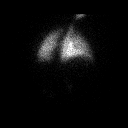
[im 4/4]
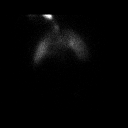

[Series 1000: lung perfusion · 1.95mm/px · 4 acquisitions, 8 frames shown]
[im 1/4]
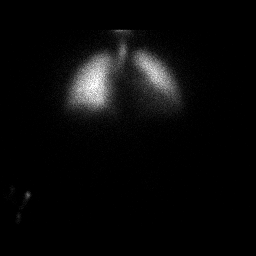
[im 1/4]
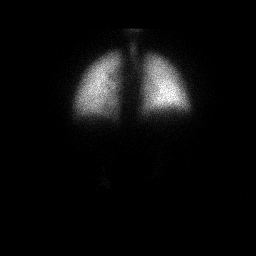
[im 2/4]
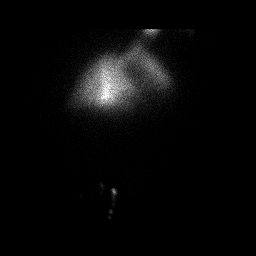
[im 2/4]
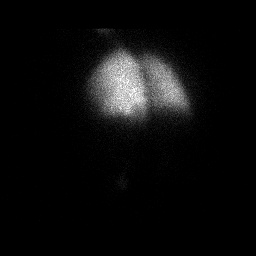
[im 3/4]
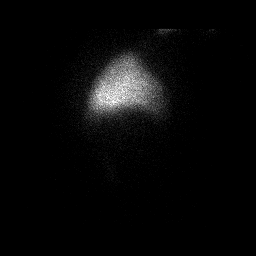
[im 3/4]
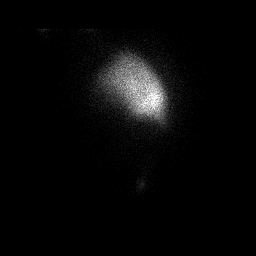
[im 4/4]
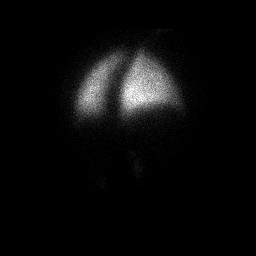
[im 4/4]
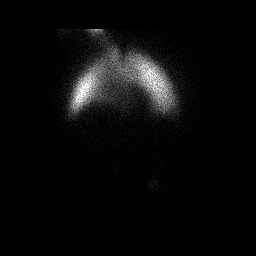

[16 of 16 positions shown; findings below may reference images not displayed]

FINDINGS: Ventilation: No focal ventilation defect.  Normal.

Perfusion: No wedge shaped peripheral perfusion defects to suggest
acute pulmonary embolism. Normal.
IMPRESSION: Normal ventilation perfusion lung scan. No evidence pulmonary
embolism. No change since the prior study. Very low probability of
pulmonary embolism.

## 2015-07-16 ENCOUNTER — Encounter: Payer: Self-pay | Admitting: Urgent Care

## 2015-07-16 ENCOUNTER — Emergency Department
Admission: EM | Admit: 2015-07-16 | Discharge: 2015-07-16 | Disposition: A | Discharge status: Home / Self Care | Payer: Self-pay | Attending: Emergency Medicine | Admitting: Emergency Medicine

## 2015-07-16 ENCOUNTER — Emergency Department: Payer: Self-pay

## 2015-07-16 DIAGNOSIS — R112 Nausea with vomiting, unspecified: Secondary | ICD-10-CM | POA: Insufficient documentation

## 2015-07-16 DIAGNOSIS — R319 Hematuria, unspecified: Secondary | ICD-10-CM | POA: Insufficient documentation

## 2015-07-16 DIAGNOSIS — Z79899 Other long term (current) drug therapy: Secondary | ICD-10-CM | POA: Insufficient documentation

## 2015-07-16 DIAGNOSIS — R109 Unspecified abdominal pain: Secondary | ICD-10-CM | POA: Insufficient documentation

## 2015-07-16 DIAGNOSIS — Z91040 Latex allergy status: Secondary | ICD-10-CM | POA: Insufficient documentation

## 2015-07-16 DIAGNOSIS — Z792 Long term (current) use of antibiotics: Secondary | ICD-10-CM | POA: Insufficient documentation

## 2015-07-16 DIAGNOSIS — Z880 Allergy status to penicillin: Secondary | ICD-10-CM | POA: Insufficient documentation

## 2015-07-16 LAB — CBC
HCT: 36.7 % (ref 35.0–47.0)
HEMOGLOBIN: 12.3 g/dL (ref 12.0–16.0)
MCH: 26.4 pg (ref 26.0–34.0)
MCHC: 33.4 g/dL (ref 32.0–36.0)
MCV: 79.1 fL — ABNORMAL LOW (ref 80.0–100.0)
PLATELETS: 255 10*3/uL (ref 150–440)
RBC: 4.64 MIL/uL (ref 3.80–5.20)
RDW: 16.1 % — AB (ref 11.5–14.5)
WBC: 10.7 10*3/uL (ref 3.6–11.0)

## 2015-07-16 LAB — URINALYSIS COMPLETE WITH MICROSCOPIC (ARMC ONLY)
BILIRUBIN URINE: NEGATIVE
GLUCOSE, UA: NEGATIVE mg/dL
Hgb urine dipstick: NEGATIVE
Leukocytes, UA: NEGATIVE
NITRITE: NEGATIVE
Protein, ur: NEGATIVE mg/dL
SPECIFIC GRAVITY, URINE: 1.024 (ref 1.005–1.030)
pH: 5 (ref 5.0–8.0)

## 2015-07-16 LAB — BASIC METABOLIC PANEL
Anion gap: 7 (ref 5–15)
BUN: 13 mg/dL (ref 6–20)
CALCIUM: 8.8 mg/dL — AB (ref 8.9–10.3)
CHLORIDE: 108 mmol/L (ref 101–111)
CO2: 24 mmol/L (ref 22–32)
CREATININE: 0.94 mg/dL (ref 0.44–1.00)
Glucose, Bld: 114 mg/dL — ABNORMAL HIGH (ref 65–99)
Potassium: 4 mmol/L (ref 3.5–5.1)
SODIUM: 139 mmol/L (ref 135–145)

## 2015-07-16 LAB — LACTIC ACID, PLASMA: Lactic Acid, Venous: 0.8 mmol/L (ref 0.5–2.0)

## 2015-07-16 MED ORDER — FENTANYL CITRATE (PF) 100 MCG/2ML IJ SOLN
50.0000 ug | Freq: Once | INTRAMUSCULAR | Status: AC
  Administered 2015-07-16: 50 ug via INTRAVENOUS

## 2015-07-16 MED ORDER — ONDANSETRON HCL 4 MG/2ML IJ SOLN
INTRAMUSCULAR | Status: AC
  Administered 2015-07-16: 4 mg via INTRAVENOUS
  Filled 2015-07-16: qty 2

## 2015-07-16 MED ORDER — ONDANSETRON HCL 4 MG PO TABS: 4.0000 mg | ORAL_TABLET | Freq: Every day | ORAL | Status: DC | PRN

## 2015-07-16 MED ORDER — DICYCLOMINE HCL 10 MG/ML IM SOLN
20.0000 mg | Freq: Once | INTRAMUSCULAR | Status: AC
  Administered 2015-07-16: 20 mg via INTRAMUSCULAR
  Filled 2015-07-16: qty 2

## 2015-07-16 MED ORDER — DICYCLOMINE HCL 10 MG/ML IM SOLN
INTRAMUSCULAR | Status: AC
  Administered 2015-07-16: 20 mg via INTRAMUSCULAR
  Filled 2015-07-16: qty 2

## 2015-07-16 MED ORDER — ONDANSETRON HCL 4 MG/2ML IJ SOLN
4.0000 mg | Freq: Once | INTRAMUSCULAR | Status: AC
  Administered 2015-07-16: 4 mg via INTRAVENOUS

## 2015-07-16 MED ORDER — HYDROMORPHONE HCL 1 MG/ML IJ SOLN
1.0000 mg | INTRAMUSCULAR | Status: AC
  Administered 2015-07-16: 1 mg via INTRAVENOUS

## 2015-07-16 MED ORDER — HYDROMORPHONE HCL 1 MG/ML IJ SOLN
INTRAMUSCULAR | Status: AC
  Administered 2015-07-16: 1 mg via INTRAVENOUS
  Filled 2015-07-16: qty 1

## 2015-07-16 MED ORDER — FENTANYL CITRATE (PF) 100 MCG/2ML IJ SOLN
INTRAMUSCULAR | Status: AC
  Administered 2015-07-16: 50 ug via INTRAVENOUS
  Filled 2015-07-16: qty 2

## 2015-07-16 MED ORDER — SODIUM CHLORIDE 0.9 % IV BOLUS (SEPSIS)
1000.0000 mL | Freq: Once | INTRAVENOUS | Status: AC
  Administered 2015-07-16: 1000 mL via INTRAVENOUS

## 2015-07-16 MED ORDER — DICYCLOMINE HCL 20 MG PO TABS: 20.0000 mg | ORAL_TABLET | Freq: Three times a day (TID) | ORAL | Status: DC | PRN

## 2015-07-16 NOTE — Discharge Instructions (Signed)
Please seek medical attention for any high fevers, chest pain, shortness of breath, change in behavior, persistent vomiting, bloody stool or any other new or concerning symptoms.   Flank Pain Flank pain refers to pain that is located on the side of the body between the upper abdomen and the back. The pain may occur over a short period of time (acute) or may be long-term or reoccurring (chronic). It may be mild or severe. Flank pain can be caused by many things. CAUSES  Some of the more common causes of flank pain include:  Muscle strains.   Muscle spasms.   A disease of your spine (vertebral disk disease).   A lung infection (pneumonia).   Fluid around your lungs (pulmonary edema).   A kidney infection.   Kidney stones.   A very painful skin rash caused by the chickenpox virus (shingles).   Gallbladder disease.  [place] care will depend on the cause of your pain. In general,  Rest as directed by your caregiver.  Drink enough fluids to keep your urine clear or pale yellow.  Only take over-the-counter or prescription medicines as directed by your caregiver. Some medicines may help relieve the pain.  Tell your caregiver about any changes in your pain.  Follow up with your caregiver as directed. SEEK IMMEDIATE MEDICAL CARE IF:   Your pain is not controlled with medicine.   You have new or worsening symptoms.  Your pain increases.   You have abdominal pain.   You have shortness of breath.   You have persistent nausea or vomiting.   You have swelling in your abdomen.   You feel faint or pass out.   You have blood in your urine.  You have a fever or persistent symptoms for more than 2-3 days.  You have a fever and your symptoms suddenly get worse. MAKE SURE YOU:   Understand these instructions.  Will watch your condition.  Will get help right away if you are not doing well or get worse.   This information is not  intended to replace advice given to you by your health care provider. Make sure you discuss any questions you have with your health care provider.   Document Released: 10/13/2005 Document Revised: 05/16/2012 Document Reviewed: 04/05/2012 Elsevier Interactive Patient Education [employer].

## 2015-07-16 NOTE — ED Notes (Signed)
PA at beside.

## 2015-07-16 NOTE — ED Notes (Signed)
Patient presents with c/o RIGHT flank pain with (+) radiation into groin area since yesterday. (+) nausea and dysuria reported.

## 2015-07-16 NOTE — ED Provider Notes (Signed)
[facility] Emergency Department Provider Note    ____________________________________________  Time seen: 2205  I have reviewed the triage vital signs and the nursing notes.   HISTORY  Chief Complaint Flank Pain   History limited by: Not Limited   HPI Brittany Nguyen is a 49 y.o. female who presents to the emergency department today because of flank pain. She states that this started 3-4 days ago. It was located in the right flank and has radiated down into her groin. She states she did see some bloody urine during this time. She states that the pain is somewhat intermittent. No alleviating or exacerbating factors that she can identify. She states she has had some mucosal type bowel movements however has not noticed blood in the stool. She has had some nausea and vomiting.   Past Medical History  Diagnosis Date  . Hx MRSA infection   . Asthma 2013  . Heart disease 2013  . Diverticulitis 2010  . Lump or mass in breast   . Endometriosis 1990  . Restless leg   . CHF (congestive heart failure) ([place])   . DVT (deep venous thrombosis) [facility])     Patient Active Problem List   Diagnosis Date Noted  . Breast pain 01/11/2013  . Hx MRSA infection   . Lump or mass in breast   . GOITER, MULTINODULAR 01/20/2009  . HYPOGLYCEMIA, UNSPECIFIED 01/20/2009  . GERD 01/20/2009  . DIVERTICULITIS OF COLON 01/20/2009    Past Surgical History  Procedure Laterality Date  . Abdominal hysterectomy      age 74  . Nasal sinus surgery  2012  . Abdominal surgery    . Cholecystectomy    . Cesarean section      Current Outpatient Rx  Name  Route  Sig  Dispense  Refill  . furosemide (LASIX) 20 MG tablet   Oral   Take 20 mg by mouth daily.         . ondansetron (ZOFRAN) 4 MG tablet   Oral   Take 1 tablet (4 mg total) by mouth daily as needed for nausea or vomiting.   10 tablet   0   . oxyCODONE-acetaminophen (ROXICET) 5-325 MG per tablet   Oral   Take 1  tablet by mouth every 6 (six) hours as needed.   8 tablet   0   . rOPINIRole (REQUIP) 2 MG tablet   Oral   Take 1 tablet (2 mg total) by mouth at bedtime.   30 tablet   0   . sulfamethoxazole-trimethoprim (BACTRIM DS,SEPTRA DS) 800-160 MG per tablet   Oral   Take 1 tablet by mouth 2 (two) times daily.   20 tablet   0     Allergies Contrast media; Latex; Penicillins; and Povidone-iodine  Family History  Problem Relation Age of Onset  . Cancer Mother 30    ovarian  . Cancer Father 42    brain  . Cancer Daughter 18    skin  . Cancer Maternal Aunt 37    breast    Social History Social History  Substance Use Topics  . Smoking status: Never Smoker   . Smokeless tobacco: Never Used  . Alcohol Use: No    Review of Systems  Constitutional: Negative for fever. Cardiovascular: Negative for chest pain. Respiratory: Negative for shortness of breath. Gastrointestinal: Positive for right flank pain. Genitourinary: Negative for dysuria. Musculoskeletal: Negative for back pain. Skin: Negative for rash. Neurological: Negative for headaches, focal weakness or  numbness.  10-point ROS otherwise negative.  ____________________________________________   PHYSICAL EXAM:  VITAL SIGNS: ED Triage Vitals  Enc Vitals Group     BP 07/16/15 2107 139/111 mmHg     Pulse Rate 07/16/15 2107 118     Resp 07/16/15 2107 26     Temp 07/16/15 2107 98.4 F (36.9 C)     Temp Source 07/16/15 2107 Oral     SpO2 07/16/15 2107 96 %     Weight 07/16/15 2107 220 lb (99.791 kg)     Height 07/16/15 2107 5\' 4"  (1.626 m)     Head Cir --      Peak Flow --      Pain Score 07/16/15 2108 10   Constitutional: Alert and oriented. Well appearing and in no distress. Eyes: Conjunctivae are normal. PERRL. Normal extraocular movements. ENT   Head: Normocephalic and atraumatic.   Nose: No congestion/rhinnorhea.   Mouth/Throat: Mucous membranes are moist.   Neck: No  stridor. Hematological/Lymphatic/Immunilogical: No cervical lymphadenopathy. Cardiovascular: Normal rate, regular rhythm.  No murmurs, rubs, or gallops. Respiratory: Normal respiratory effort without tachypnea nor retractions. Breath sounds are clear and equal bilaterally. No wheezes/rales/rhonchi. Gastrointestinal: Soft and nontender. No distention.  Genitourinary: Deferred Musculoskeletal: Normal range of motion in all extremities. No joint effusions.  No lower extremity tenderness nor edema. Neurologic:  Normal speech and language. No gross focal neurologic deficits are appreciated.  Skin:  Skin is warm, dry and intact. No rash noted. Psychiatric: Mood and affect are normal. Speech and behavior are normal. Patient exhibits appropriate insight and judgment.  ____________________________________________    LABS (pertinent positives/negatives)  Labs Reviewed  CBC - Abnormal; Notable for the following:    MCV 79.1 (*)    RDW 16.1 (*)    All other components within normal limits  BASIC METABOLIC PANEL - Abnormal; Notable for the following:    Glucose, Bld 114 (*)    Calcium 8.8 (*)    All other components within normal limits  URINALYSIS COMPLETEWITH MICROSCOPIC ([facility] ONLY) - Abnormal; Notable for the following:    Color, Urine YELLOW (*)    APPearance CLEAR (*)    Ketones, ur TRACE (*)    Bacteria, UA RARE (*)    Squamous Epithelial / LPF 0-5 (*)    All other components within normal limits     ____________________________________________   EKG   None  ____________________________________________    RADIOLOGY  CT abd/pel stone protocol IMPRESSION: 1. No explanation for patient's radiating right-sided flank pain. Specifically, no evidence of nephrolithiasis, urinary or enteric obstruction. 2. Postsurgical change of the sigmoid colon without evidence of enteric obstruction. 3. Post  cholecystectomy.   ____________________________________________   PROCEDURES  Procedure(s) performed: None  Critical Care performed: No  ____________________________________________   INITIAL IMPRESSION / ASSESSMENT AND PLAN / ED COURSE  Pertinent labs & imaging results that were available during my care of the patient were reviewed by me and considered in my medical decision making (see chart for details).  Patient presented to the emergency department today because of right flank pain. On exam here no obvious physical findings that would explain the pain. A CT scan was done for stone which was negative. I additionally sent a lactate which was negative. Patient stated she did feel better after a dose of Bentyl. Think it could be partly some inflammatory bowel. Will discharge home with Bentyl and antiemetics. Discussed return precautions.  ____________________________________________   FINAL CLINICAL IMPRESSION(S) / ED DIAGNOSES  Final diagnoses:  Flank pain     Nance Pear, MD 07/16/15 [phone]

## 2015-07-20 ENCOUNTER — Emergency Department: Payer: Self-pay

## 2015-07-20 ENCOUNTER — Emergency Department
Admission: EM | Admit: 2015-07-20 | Discharge: 2015-07-20 | Disposition: A | Discharge status: Home / Self Care | Payer: Self-pay | Attending: Emergency Medicine | Admitting: Emergency Medicine

## 2015-07-20 DIAGNOSIS — R079 Chest pain, unspecified: Secondary | ICD-10-CM

## 2015-07-20 DIAGNOSIS — G2581 Restless legs syndrome: Secondary | ICD-10-CM | POA: Insufficient documentation

## 2015-07-20 DIAGNOSIS — Z880 Allergy status to penicillin: Secondary | ICD-10-CM | POA: Insufficient documentation

## 2015-07-20 DIAGNOSIS — J45901 Unspecified asthma with (acute) exacerbation: Secondary | ICD-10-CM | POA: Insufficient documentation

## 2015-07-20 DIAGNOSIS — R0602 Shortness of breath: Secondary | ICD-10-CM

## 2015-07-20 DIAGNOSIS — Z79899 Other long term (current) drug therapy: Secondary | ICD-10-CM | POA: Insufficient documentation

## 2015-07-20 DIAGNOSIS — R110 Nausea: Secondary | ICD-10-CM | POA: Insufficient documentation

## 2015-07-20 DIAGNOSIS — R51 Headache: Secondary | ICD-10-CM | POA: Insufficient documentation

## 2015-07-20 DIAGNOSIS — R519 Headache, unspecified: Secondary | ICD-10-CM

## 2015-07-20 DIAGNOSIS — Z91040 Latex allergy status: Secondary | ICD-10-CM | POA: Insufficient documentation

## 2015-07-20 LAB — CBC
HCT: 38.8 % (ref 35.0–47.0)
Hemoglobin: 12.7 g/dL (ref 12.0–16.0)
MCH: 26 pg (ref 26.0–34.0)
MCHC: 32.8 g/dL (ref 32.0–36.0)
MCV: 79.4 fL — ABNORMAL LOW (ref 80.0–100.0)
PLATELETS: 248 10*3/uL (ref 150–440)
RBC: 4.88 MIL/uL (ref 3.80–5.20)
RDW: 16.1 % — AB (ref 11.5–14.5)
WBC: 8.8 10*3/uL (ref 3.6–11.0)

## 2015-07-20 LAB — COMPREHENSIVE METABOLIC PANEL
ALBUMIN: 3.8 g/dL (ref 3.5–5.0)
ALT: 25 U/L (ref 14–54)
AST: 21 U/L (ref 15–41)
Alkaline Phosphatase: 115 U/L (ref 38–126)
Anion gap: 7 (ref 5–15)
BUN: 10 mg/dL (ref 6–20)
CHLORIDE: 106 mmol/L (ref 101–111)
CO2: 27 mmol/L (ref 22–32)
Calcium: 9.6 mg/dL (ref 8.9–10.3)
Creatinine, Ser: 0.85 mg/dL (ref 0.44–1.00)
GFR calc Af Amer: >60 mL/min (ref >60)
GFR calc non Af Amer: >60 mL/min (ref >60)
GLUCOSE: 118 mg/dL — AB (ref 65–99)
POTASSIUM: 3.6 mmol/L (ref 3.5–5.1)
SODIUM: 140 mmol/L (ref 135–145)
Total Bilirubin: <0.1 mg/dL — ABNORMAL LOW (ref 0.3–1.2)
Total Protein: 8.3 g/dL — ABNORMAL HIGH (ref 6.5–8.1)

## 2015-07-20 LAB — TROPONIN I: TROPONIN I: 0.03 ng/mL (ref <0.031)

## 2015-07-20 MED ORDER — ACETAMINOPHEN 325 MG PO TABS
ORAL_TABLET | ORAL | Status: AC
  Filled 2015-07-20: qty 1

## 2015-07-20 MED ORDER — IBUPROFEN 600 MG PO TABS
600.0000 mg | ORAL_TABLET | Freq: Once | ORAL | Status: AC
  Administered 2015-07-20: 600 mg via ORAL
  Filled 2015-07-20: qty 1

## 2015-07-20 MED ORDER — ACETAMINOPHEN 325 MG PO TABS
650.0000 mg | ORAL_TABLET | Freq: Once | ORAL | Status: AC
  Administered 2015-07-20: 650 mg via ORAL
  Filled 2015-07-20: qty 2

## 2015-07-20 NOTE — ED Notes (Signed)
Pt returns from xray

## 2015-07-20 NOTE — ED Notes (Signed)
Pt says she's been up all night with chest pain/shortness of breath and headache; husband is diabetic, pt is not, but he checked her blood sugar at home this am; 489 on monitor reading at home;

## 2015-07-20 NOTE — Discharge Instructions (Signed)
Return to the emergency department for any worsening condition including any new or different headache, chest pain, trouble breathing, dizziness, passing out, leg pain or swelling, or any other symptoms concerning to you.  We discussed you do need to have additional workup/evaluation for diabetes, and this can be done with her primary care physician.     General Headache Without Cause A headache is pain or discomfort felt around the head or neck area. The specific cause of a headache may not be found. There are many causes and types of headaches. A few common ones are:  Tension headaches.  Migraine headaches.  Cluster headaches.  Chronic daily headaches. HOME CARE INSTRUCTIONS  Watch your condition for any changes. Take these steps to help with your condition: Managing Pain  Take over-the-counter and prescription medicines only as told by your health care provider.  Lie down in a dark, quiet room when you have a headache.  If directed, apply ice to the head and neck area:  Put ice in a plastic bag.  Place a towel between your skin and the bag.  Leave the ice on for 20 minutes, 2-3 times per day.  Use a heating pad or hot shower to apply heat to the head and neck area as told by your health care provider.  Keep lights dim if bright lights bother you or make your headaches worse. Eating and Drinking  Eat meals on a regular schedule.  Limit alcohol use.  Decrease the amount of caffeine you drink, or stop drinking caffeine. General Instructions  Keep all follow-up visits as told by your health care provider. This is important.  Keep a headache journal to help find out what may trigger your headaches. For example, write down:  What you eat and drink.  How much sleep you get.  Any change to your diet or medicines.  Try massage or other relaxation techniques.  Limit stress.  Sit up straight, and do not tense your muscles.  Do not use tobacco products, including  cigarettes, chewing tobacco, or e-cigarettes. If you need help quitting, ask your health care provider.  Exercise regularly as told by your health care provider.  Sleep on a regular schedule. Get 7-9 hours of sleep, or the amount recommended by your health care provider. SEEK MEDICAL CARE IF:   Your symptoms are not helped by medicine.  You have a headache that is different from the usual headache.  You have nausea or you vomit.  You have a fever. SEEK IMMEDIATE MEDICAL CARE IF:   Your headache becomes severe.  You have repeated vomiting.  You have a stiff neck.  You have a loss of vision.  You have problems with speech.  You have pain in the eye or ear.  You have muscular weakness or loss of muscle control.  You lose your balance or have trouble walking.  You feel faint or pass out.  You have confusion.   This information is not intended to replace advice given to you by your health care provider. Make sure you discuss any questions you have with your health care provider.   Document Released: 08/22/2005 Document Revised: 05/13/2015 Document Reviewed: 12/15/2014 Elsevier Interactive Patient Education 2016 Elsevier Inc[employer]  Nonspecific Chest Pain It is often hard to find the cause of chest pain. There is always a chance that your pain could be related to something serious, such as a heart attack or a blood clot in your lungs. Chest pain can also be caused by conditions  that are not life-threatening. If you have chest pain, it is very important to follow up with your doctor.  HOME CARE  If you were prescribed an antibiotic medicine, finish it all even if you start to feel better.  Avoid any activities that cause chest pain.  Do not use any tobacco products, including cigarettes, chewing tobacco, or electronic cigarettes. If you need help quitting, ask your doctor.  Do not drink alcohol.  Take medicines only as told by your doctor.  Keep all follow-up visits as  told by your doctor. This is important. This includes any further testing if your chest pain does not go away.  Your doctor may tell you to keep your head raised (elevated) while you sleep.  Make lifestyle changes as told by your doctor. These may include:  Getting regular exercise. Ask your doctor to suggest some activities that are safe for you.  Eating a heart-healthy diet. Your doctor or a diet specialist (dietitian) can help you to learn healthy eating options.  Maintaining a healthy weight.  Managing diabetes, if necessary.  Reducing stress. GET HELP IF:  Your chest pain does not go away, even after treatment.  You have a rash with blisters on your chest.  You have a fever. GET HELP RIGHT AWAY IF:  Your chest pain is worse.  You have an increasing cough, or you cough up blood.  You have severe belly (abdominal) pain.  You feel extremely weak.  You pass out (faint).  You have chills.  You have sudden, unexplained chest discomfort.  You have sudden, unexplained discomfort in your arms, back, neck, or jaw.  You have shortness of breath at any time.  You suddenly start to sweat, or your skin gets clammy.  You feel nauseous.  You vomit.  You suddenly feel light-headed or dizzy.  Your heart begins to beat quickly, or it feels like it is skipping beats. These symptoms may be an emergency. Do not wait to see if the symptoms will go away. Get medical help right away. Call your local emergency services (911 in the [place] Do not drive yourself to the hospital.   This information is not intended to replace advice given to you by your health care provider. Make sure you discuss any questions you have with your health care provider.   Document Released: 02/08/2008 Document Revised: 09/12/2014 Document Reviewed: 03/28/2014 Elsevier Interactive Patient Education 2016 Elsevier Inc.  Shortness of Breath Shortness of breath means you have trouble breathing. It could  also mean that you have a medical problem. You should get immediate medical care for shortness of breath. CAUSES   Not enough oxygen in the air such as with high altitudes or a smoke-filled room.  Certain lung diseases, infections, or problems.  Heart disease or conditions, such as angina or heart failure.  Low red blood cells (anemia).  Poor physical fitness, which can cause shortness of breath when you exercise.  Chest or back injuries or stiffness.  Being overweight.  Smoking.  Anxiety, which can make you feel like you are not getting enough air. DIAGNOSIS  Serious medical problems can often be found during your physical exam. Tests may also be done to determine why you are having shortness of breath. Tests may include:  Chest X-rays.  Lung function tests.  Blood tests.  An electrocardiogram (ECG).  An ambulatory electrocardiogram. An ambulatory ECG records your heartbeat patterns over a 24-hour period.  Exercise testing.  A transthoracic echocardiogram (TTE). During echocardiography, sound waves  are used to evaluate how blood flows through your heart.  A transesophageal echocardiogram (TEE).  Imaging scans. Your health care provider may not be able to find a cause for your shortness of breath after your exam. In this case, it is important to have a follow-up exam with your health care provider as directed.  TREATMENT  Treatment for shortness of breath depends on the cause of your symptoms and can vary greatly. HOME CARE INSTRUCTIONS   Do not smoke. Smoking is a common cause of shortness of breath. If you smoke, ask for help to quit.  Avoid being around chemicals or things that may bother your breathing, such as paint fumes and dust.  Rest as needed. Slowly resume your usual activities.  If medicines were prescribed, take them as directed for the full length of time directed. This includes oxygen and any inhaled medicines.  Keep all follow-up appointments as  directed by your health care provider. SEEK MEDICAL CARE IF:   Your condition does not improve in the time expected.  You have a hard time doing your normal activities even with rest.  You have any new symptoms. SEEK IMMEDIATE MEDICAL CARE IF:   Your shortness of breath gets worse.  You feel light-headed, faint, or develop a cough not controlled with medicines.  You start coughing up blood.  You have pain with breathing.  You have chest pain or pain in your arms, shoulders, or abdomen.  You have a fever.  You are unable to walk up stairs or exercise the way you normally do. MAKE SURE YOU:  Understand these instructions.  Will watch your condition.  Will get help right away if you are not doing well or get worse.   This information is not intended to replace advice given to you by your health care provider. Make sure you discuss any questions you have with your health care provider.   Document Released: 05/17/2001 Document Revised: 08/27/2013 Document Reviewed: 11/07/2011 Elsevier Interactive Patient Education [employer].

## 2015-07-20 NOTE — ED Provider Notes (Signed)
[facility] Emergency Department Provider Note   ____________________________________________  Time seen: 8 AM I have reviewed the triage vital signs and the triage nursing note.  HISTORY  Chief Complaint Chest Pain; Shortness of Breath; Headache; and Nausea   Historian Patient and spouse  HPI Brittany Nguyen is a 49 y.o. female who is here this morning for evaluation of hyperglycemia. Patient states that she was seen about 4 days ago due to flank pain, which was initially felt to possibly be due to kidney stones, however with a negative CT scan, was discussed about possible inflammatory bowel disease. Since then she states that she has felt bad all over including intermittent nonspecific headache/global, chronic shortness of breath and intermittent sharp chest pains which are similar to prior episodes for which she has had workup for cardiac disease, and increase restless legs.  Patient states that overnight she felt extremely uncomfortable with pain all over and worsening restless legs, and her husband felt like her symptoms matched when he had high blood sugar, and so he checked her blood sugar and found it to be in the 480s. They came over for evaluation of possible diabetes. Patient has been told she was prediabetic in the past.Patient had been sipping Sprite all night.    Past Medical History  Diagnosis Date  . Hx MRSA infection   . Asthma 2013  . Heart disease 2013  . Diverticulitis 2010  . Lump or mass in breast   . Endometriosis 1990  . Restless leg   . CHF (congestive heart failure) ([place])   . DVT (deep venous thrombosis) [facility])     Patient Active Problem List   Diagnosis Date Noted  . Breast pain 01/11/2013  . Hx MRSA infection   . Lump or mass in breast   . GOITER, MULTINODULAR 01/20/2009  . HYPOGLYCEMIA, UNSPECIFIED 01/20/2009  . GERD 01/20/2009  . DIVERTICULITIS OF COLON 01/20/2009    Past Surgical History  Procedure Laterality Date   . Abdominal hysterectomy      age 34  . Nasal sinus surgery  2012  . Abdominal surgery    . Cholecystectomy    . Cesarean section      Current Outpatient Rx  Name  Route  Sig  Dispense  Refill  . dicyclomine (BENTYL) 20 MG tablet   Oral   Take 1 tablet (20 mg total) by mouth 3 (three) times daily as needed for spasms.   30 tablet   0   . rOPINIRole (REQUIP) 2 MG tablet   Oral   Take 1 tablet (2 mg total) by mouth at bedtime. Patient taking differently: Take 4 mg by mouth at bedtime.    30 tablet   0   . furosemide (LASIX) 20 MG tablet   Oral   Take 20 mg by mouth daily.         . ondansetron (ZOFRAN) 4 MG tablet   Oral   Take 1 tablet (4 mg total) by mouth daily as needed for nausea or vomiting. Patient not taking: Reported on 07/20/2015   20 tablet   0   . oxyCODONE-acetaminophen (ROXICET) 5-325 MG per tablet   Oral   Take 1 tablet by mouth every 6 (six) hours as needed.   8 tablet   0   . sulfamethoxazole-trimethoprim (BACTRIM DS,SEPTRA DS) 800-160 MG per tablet   Oral   Take 1 tablet by mouth 2 (two) times daily. Patient not taking: Reported on 07/16/2015   20 tablet  0     Allergies Contrast media; Latex; Ondansetron; Penicillins; and Povidone-iodine  Family History  Problem Relation Age of Onset  . Cancer Mother 30    ovarian  . Cancer Father 61    brain  . Cancer Daughter 18    skin  . Cancer Maternal Aunt 61    breast    Social History Social History  Substance Use Topics  . Smoking status: Never Smoker   . Smokeless tobacco: Never Used  . Alcohol Use: No    Review of Systems  Constitutional: Negative for fever. Eyes: Negative for visual changes. ENT: Negative for sore throat. Cardiovascular: See history of present illness Respiratory: See history of present illness Gastrointestinal: Negative for  vomiting and diarrhea. Genitourinary: Negative for dysuria. Musculoskeletal: Negative for back pain. Skin: Negative for  rash. Neurological: See history of present illness. No dizziness. Noted passing out. No weakness. No numbness. No confusion. 10 point Review of Systems otherwise negative ____________________________________________   PHYSICAL EXAM:  VITAL SIGNS: ED Triage Vitals  Enc Vitals Group     BP 07/20/15 0639 135/89 mmHg     Pulse Rate 07/20/15 0639 106     Resp 07/20/15 0639 20     Temp 07/20/15 0639 98.3 F (36.8 C)     Temp Source 07/20/15 0639 Oral     SpO2 07/20/15 0639 96 %     Weight 07/20/15 0639 210 lb (95.255 kg)     Height 07/20/15 0639 5\' 5"  (1.651 m)     Head Cir --      Peak Flow --      Pain Score 07/20/15 0637 8     Pain Loc --      Pain Edu? --      Excl. in [place]? --      Constitutional: Alert and oriented. Well appearing and in no distress. Eyes: Conjunctivae are normal. PERRL. Normal extraocular movements. ENT   Head: Normocephalic and atraumatic.   Nose: No congestion/rhinnorhea.   Mouth/Throat: Mucous membranes are moist.   Neck: No stridor. Cardiovascular/Chest: Normal rate, regular rhythm.  No murmurs, rubs, or gallops. Respiratory: Normal respiratory effort without tachypnea nor retractions. Breath sounds are clear and equal bilaterally. No wheezes/rales/rhonchi. Gastrointestinal: Soft. No distention, no guarding, no rebound. Nontender  Genitourinary/rectal:Deferred Musculoskeletal: Nontender with normal range of motion in all extremities. No joint effusions. No calf tenderness. Trace bilateral lower extremity edema. Neurologic:  Normal speech and language. No gross or focal neurologic deficits are appreciated. Skin:  Skin is warm, dry and intact. No rash noted. Psychiatric: Mood and affect are normal. Speech and behavior are normal. Patient exhibits appropriate insight and judgment.  ____________________________________________   EKG I, Lisa Roca, MD, the attending physician have personally viewed and interpreted all ECGs.  107 bpm.  Sinus tachycardia. Narrow QRS. Left axis deviation. Nonspecific ST and T-wave. ____________________________________________  LABS (pertinent positives/negatives)  CBC within normal limits Comprehensive metabolic panel within normal limits except glucose 118 Troponin 0.03  ____________________________________________  RADIOLOGY All Xrays were viewed by me. Imaging interpreted by Radiologist.  Chest two-view: Negative chest __________________________________________  PROCEDURES  Procedure(s) performed: None  Critical Care performed: None  ____________________________________________   ED COURSE / ASSESSMENT AND PLAN  CONSULTATIONS: None  Pertinent labs & imaging results that were available during my care of the patient were reviewed by me and considered in my medical decision making (see chart for details).   The patient is here mainly for evaluation after finding fingerstick blood glucose in  the 480s at home. Her metabolic panel shows glucose of 118, and previous visits also in the 1 teens as well. I will refer patient back to her primary care physician for fasting labs as well as hemoglobin A1c for further evaluation.  In terms of the other symptoms including nonspecific headache, chronic chest discomfort and shortness of breath, and chronic restless legs, I am not suspicious of a new emergency source of headache, chest discomfort, body pain, or shortness of breath.  Patient agrees she is not having any new or different chest discomfort or shortness of breath from what she typically has. She has had a cardiac workup recently.    I discussed with the patient, it sounds like her symptoms are head to toe, and maybe inflammation based. I will start on ibuprofen. She is to follow up with primary care physician.    Patient / Family / Caregiver informed of clinical course, medical decision-making process, and agree with plan.   I discussed return precautions, follow-up  instructions, and discharged instructions with patient and/or family.  ___________________________________________   FINAL CLINICAL IMPRESSION(S) / ED DIAGNOSES   Final diagnoses:  Restless legs syndrome  Nonintractable headache, unspecified chronicity pattern, unspecified headache type  Shortness of breath  Chest pain, unspecified chest pain type       Lisa Roca, MD 07/20/15 [phone]

## 2015-09-07 ENCOUNTER — Emergency Department: Payer: Self-pay

## 2015-09-07 ENCOUNTER — Emergency Department
Admission: EM | Admit: 2015-09-07 | Discharge: 2015-09-07 | Disposition: A | Discharge status: Home / Self Care | Payer: Self-pay | Attending: Emergency Medicine | Admitting: Emergency Medicine

## 2015-09-07 DIAGNOSIS — R110 Nausea: Secondary | ICD-10-CM | POA: Insufficient documentation

## 2015-09-07 DIAGNOSIS — Z91040 Latex allergy status: Secondary | ICD-10-CM | POA: Insufficient documentation

## 2015-09-07 DIAGNOSIS — Z79899 Other long term (current) drug therapy: Secondary | ICD-10-CM | POA: Insufficient documentation

## 2015-09-07 DIAGNOSIS — N390 Urinary tract infection, site not specified: Secondary | ICD-10-CM | POA: Insufficient documentation

## 2015-09-07 DIAGNOSIS — Z880 Allergy status to penicillin: Secondary | ICD-10-CM | POA: Insufficient documentation

## 2015-09-07 DIAGNOSIS — R1010 Upper abdominal pain, unspecified: Secondary | ICD-10-CM

## 2015-09-07 LAB — URINALYSIS COMPLETE WITH MICROSCOPIC (ARMC ONLY)
Bilirubin Urine: NEGATIVE
GLUCOSE, UA: NEGATIVE mg/dL
Ketones, ur: NEGATIVE mg/dL
NITRITE: NEGATIVE
PH: 5 (ref 5.0–8.0)
Protein, ur: NEGATIVE mg/dL
Specific Gravity, Urine: 1.004 — ABNORMAL LOW (ref 1.005–1.030)

## 2015-09-07 LAB — CBC
HEMATOCRIT: 36.9 % (ref 35.0–47.0)
HEMOGLOBIN: 12.4 g/dL (ref 12.0–16.0)
MCH: 26.6 pg (ref 26.0–34.0)
MCHC: 33.6 g/dL (ref 32.0–36.0)
MCV: 79.4 fL — ABNORMAL LOW (ref 80.0–100.0)
Platelets: 259 10*3/uL (ref 150–440)
RBC: 4.64 MIL/uL (ref 3.80–5.20)
RDW: 15.7 % — ABNORMAL HIGH (ref 11.5–14.5)
WBC: 10 10*3/uL (ref 3.6–11.0)

## 2015-09-07 LAB — COMPREHENSIVE METABOLIC PANEL
ALBUMIN: 3.8 g/dL (ref 3.5–5.0)
ALT: 22 U/L (ref 14–54)
ANION GAP: 5 (ref 5–15)
AST: 17 U/L (ref 15–41)
Alkaline Phosphatase: 101 U/L (ref 38–126)
BILIRUBIN TOTAL: 0.3 mg/dL (ref 0.3–1.2)
BUN: 12 mg/dL (ref 6–20)
CHLORIDE: 109 mmol/L (ref 101–111)
CO2: 26 mmol/L (ref 22–32)
Calcium: 9.2 mg/dL (ref 8.9–10.3)
Creatinine, Ser: 0.93 mg/dL (ref 0.44–1.00)
GFR calc Af Amer: >60 mL/min (ref >60)
GFR calc non Af Amer: >60 mL/min (ref >60)
GLUCOSE: 113 mg/dL — AB (ref 65–99)
POTASSIUM: 3.8 mmol/L (ref 3.5–5.1)
SODIUM: 140 mmol/L (ref 135–145)
TOTAL PROTEIN: 7.3 g/dL (ref 6.5–8.1)

## 2015-09-07 LAB — LIPASE, BLOOD: LIPASE: 32 U/L (ref 11–51)

## 2015-09-07 MED ORDER — PROMETHAZINE HCL 25 MG/ML IJ SOLN
12.5000 mg | Freq: Once | INTRAMUSCULAR | Status: AC
  Administered 2015-09-07: 12.5 mg via INTRAVENOUS
  Filled 2015-09-07: qty 1

## 2015-09-07 MED ORDER — PANTOPRAZOLE SODIUM 40 MG IV SOLR
40.0000 mg | Freq: Once | INTRAVENOUS | Status: AC
  Administered 2015-09-07: 40 mg via INTRAVENOUS
  Filled 2015-09-07: qty 40

## 2015-09-07 MED ORDER — MORPHINE SULFATE (PF) 4 MG/ML IV SOLN
4.0000 mg | Freq: Once | INTRAVENOUS | Status: AC
  Administered 2015-09-07: 4 mg via INTRAVENOUS
  Filled 2015-09-07: qty 1

## 2015-09-07 MED ORDER — DICYCLOMINE HCL 20 MG PO TABS: 20.0000 mg | ORAL_TABLET | Freq: Four times a day (QID) | ORAL | Status: DC | PRN

## 2015-09-07 MED ORDER — CIPROFLOXACIN HCL 500 MG PO TABS: 500.0000 mg | ORAL_TABLET | Freq: Two times a day (BID) | ORAL | Status: DC

## 2015-09-07 MED ORDER — CIPROFLOXACIN IN D5W 400 MG/200ML IV SOLN
400.0000 mg | Freq: Once | INTRAVENOUS | Status: AC
Start: 2015-09-07 — End: 2015-09-07
  Administered 2015-09-07: 400 mg via INTRAVENOUS
  Filled 2015-09-07: qty 200

## 2015-09-07 MED ORDER — SODIUM CHLORIDE 0.9 % IV BOLUS (SEPSIS)
1000.0000 mL | Freq: Once | INTRAVENOUS | Status: AC
  Administered 2015-09-07: 1000 mL via INTRAVENOUS

## 2015-09-07 MED ORDER — PROMETHAZINE HCL 25 MG PO TABS: 25.0000 mg | ORAL_TABLET | Freq: Four times a day (QID) | ORAL | Status: DC | PRN

## 2015-09-07 MED ORDER — PROMETHAZINE HCL 25 MG/ML IJ SOLN
12.5000 mg | Freq: Once | INTRAMUSCULAR | Status: AC
  Administered 2015-09-07: 12.5 mg via INTRAVENOUS

## 2015-09-07 NOTE — ED Notes (Signed)
Report received by [facility] [place]. D/c paper in hand, will d/c when iv cipro administered.

## 2015-09-07 NOTE — ED Provider Notes (Signed)
[facility] Emergency Department Provider Note  ____________________________________________  Time seen: Approximately 4:53 AM  I have reviewed the triage vital signs and the nursing notes.   HISTORY  Chief Complaint Abdominal Pain    HPI Consuela Lorraine is a 50 y.o. female who presents to the ED from home with a chief complaint of abdominal pain. Patient has a history of diverticulitis s/p surgical repair, s/p cholecystectomywho has been having ongoing issues with right sided flank and upper abdominal pain 2 months. She was thought to have kidney stones but later told by her PCP to start daily Pepcid. Patient has been having a two-week history of "stomach pains" (sharp, burning) which was worsened tonight. She was unable to rest secondary to pain. Ate a meal of collard greens, beans and pork last evening. Complains of upper abdominal pain, specifically right upper quadrant radiating into her flank. Symptoms associated with nausea, no vomiting. Denies recent travel or trauma. Denies associated fever, chills, chest pain, shortness of breath, diarrhea.  Past Medical History  Diagnosis Date  . Hx MRSA infection   . Asthma 2013  . Heart disease 2013  . Diverticulitis 2010  . Lump or mass in breast   . Endometriosis 1990  . Restless leg   . CHF (congestive heart failure) ([place])   . DVT (deep venous thrombosis) [facility])     Patient Active Problem List   Diagnosis Date Noted  . Breast pain 01/11/2013  . Hx MRSA infection   . Lump or mass in breast   . GOITER, MULTINODULAR 01/20/2009  . HYPOGLYCEMIA, UNSPECIFIED 01/20/2009  . GERD 01/20/2009  . DIVERTICULITIS OF COLON 01/20/2009    Past Surgical History  Procedure Laterality Date  . Abdominal hysterectomy      age 33  . Nasal sinus surgery  2012  . Abdominal surgery    . Cholecystectomy    . Cesarean section      Current Outpatient Rx  Name  Route  Sig  Dispense  Refill  . rOPINIRole (REQUIP) 2  MG tablet   Oral   Take 1 tablet (2 mg total) by mouth at bedtime. Patient taking differently: Take 4 mg by mouth at bedtime.    30 tablet   0   . ciprofloxacin (CIPRO) 500 MG tablet   Oral   Take 1 tablet (500 mg total) by mouth 2 (two) times daily.   14 tablet   0   . dicyclomine (BENTYL) 20 MG tablet   Oral   Take 1 tablet (20 mg total) by mouth every 6 (six) hours as needed.   20 tablet   0   . ondansetron (ZOFRAN) 4 MG tablet   Oral   Take 1 tablet (4 mg total) by mouth daily as needed for nausea or vomiting. Patient not taking: Reported on 07/20/2015   20 tablet   0   . oxyCODONE-acetaminophen (ROXICET) 5-325 MG per tablet   Oral   Take 1 tablet by mouth every 6 (six) hours as needed.   8 tablet   0   . promethazine (PHENERGAN) 25 MG tablet   Oral   Take 1 tablet (25 mg total) by mouth every 6 (six) hours as needed for nausea or vomiting.   20 tablet   0   . sulfamethoxazole-trimethoprim (BACTRIM DS,SEPTRA DS) 800-160 MG per tablet   Oral   Take 1 tablet by mouth 2 (two) times daily. Patient not taking: Reported on 07/16/2015   20 tablet  0     Allergies Contrast media; Latex; Ondansetron; Penicillins; and Povidone-iodine  Family History  Problem Relation Age of Onset  . Cancer Mother 30    ovarian  . Cancer Father 41    brain  . Cancer Daughter 18    skin  . Cancer Maternal Aunt 82    breast    Social History Social History  Substance Use Topics  . Smoking status: Never Smoker   . Smokeless tobacco: Never Used  . Alcohol Use: No    Review of Systems Constitutional: No fever/chills Eyes: No visual changes. ENT: No sore throat. Cardiovascular: Denies chest pain. Respiratory: Denies shortness of breath. Gastrointestinal: Positive for abdominal pain.  Positive for nausea, no vomiting.  No diarrhea.  No constipation. Genitourinary: Negative for dysuria. Musculoskeletal: Negative for back pain. Skin: Negative for rash. Neurological:  Negative for headaches, focal weakness or numbness.  10-point ROS otherwise negative.  ____________________________________________   PHYSICAL EXAM:  VITAL SIGNS: ED Triage Vitals  Enc Vitals Group     BP 09/07/15 0254 105/54 mmHg     Pulse Rate 09/07/15 0254 107     Resp 09/07/15 0254 20     Temp 09/07/15 0254 97.6 F (36.4 C)     Temp Source 09/07/15 0254 Oral     SpO2 09/07/15 0254 97 %     Weight 09/07/15 0254 200 lb (90.719 kg)     Height 09/07/15 0254 5\' 5"  (1.651 m)     Head Cir --      Peak Flow --      Pain Score 09/07/15 0254 8     Pain Loc --      Pain Edu? --      Excl. in [place]? --     Constitutional: Alert and oriented. Well appearing and in no acute distress. Eyes: Conjunctivae are normal. PERRL. EOMI. Head: Atraumatic. Nose: No congestion/rhinnorhea. Mouth/Throat: Mucous membranes are moist.  Oropharynx non-erythematous. Neck: No stridor.   Cardiovascular: Normal rate, regular rhythm. Grossly normal heart sounds.  Good peripheral circulation. Respiratory: Normal respiratory effort.  No retractions. Lungs CTAB. Gastrointestinal: Soft and mildly tender to palpation epigastrium and RUQ without rebound or guarding. No distention. No abdominal bruits. No CVA tenderness. Musculoskeletal: No lower extremity tenderness nor edema.  No joint effusions. Neurologic:  Normal speech and language. No gross focal neurologic deficits are appreciated. No gait instability. Skin:  Skin is warm, dry and intact. No rash noted. Psychiatric: Mood and affect are normal. Speech and behavior are normal.  ____________________________________________   LABS (all labs ordered are listed, but only abnormal results are displayed)  Labs Reviewed  COMPREHENSIVE METABOLIC PANEL - Abnormal; Notable for the following:    Glucose, Bld 113 (*)    All other components within normal limits  CBC - Abnormal; Notable for the following:    MCV 79.4 (*)    RDW 15.7 (*)    All other components  within normal limits  URINALYSIS COMPLETEWITH MICROSCOPIC ([facility] ONLY) - Abnormal; Notable for the following:    Color, Urine YELLOW (*)    APPearance HAZY (*)    Specific Gravity, Urine 1.004 (*)    Hgb urine dipstick 1+ (*)    Leukocytes, UA 2+ (*)    Bacteria, UA MANY (*)    Squamous Epithelial / LPF 6-30 (*)    All other components within normal limits  LIPASE, BLOOD   ____________________________________________  EKG  ED ECG REPORT I, SUNG,JADE J, the attending physician, personally viewed  and interpreted this ECG.   Date: 09/07/2015  EKG Time: 0532  Rate: 109  Rhythm: sinus tachycardia  Axis: LAD  Intervals:none  ST&T Change: Nonspecific  ____________________________________________  RADIOLOGY  CT abdomen and pelvis with oral contrast only interpreted per Dr. Radene Knee: No acute abnormality seen within the abdomen or pelvis. ____________________________________________   PROCEDURES  Procedure(s) performed: None  Critical Care performed: No  ____________________________________________   INITIAL IMPRESSION / ASSESSMENT AND PLAN / ED COURSE  Pertinent labs & imaging results that were available during my care of the patient were reviewed by me and considered in my medical decision making (see chart for details).  50 year old female who presents with upper abdominal and flank pain; symptoms ongoing x 2 weeks. Laboratory results unremarkable. Will administer IV analgesia, obtain CT abdomen/pelvis to evaluate intra-abdominal etiology and reassess.  ----------------------------------------- 6:56 AM on 09/07/2015 -----------------------------------------  Patient complains of nausea after returning from CT scan. No emesis. Updated patient and spouse of negative imaging studies. Will administer additional dose of IV Phenergan, give first dose of antibiotic IV. Anticipate discharge home with GI follow-up. Strict return precautions given. Both verbalize understanding  and agree with plan of care. ____________________________________________   FINAL CLINICAL IMPRESSION(S) / ED DIAGNOSES  Final diagnoses:  Pain of upper abdomen  UTI (lower urinary tract infection)      Paulette Blanch, MD 09/07/15 [phone]

## 2015-09-07 NOTE — ED Notes (Signed)
Patient transported to CT 

## 2015-09-07 NOTE — Discharge Instructions (Signed)
1. Take antibiotic as prescribed (Cipro 500 mg twice daily 7 days). 2. Medicines as needed for abdominal discomfort and nausea (Bentyl/Phenergan #20). 3. Clear liquids 12 hours, then bland diet 5 days, then slowly advance diet as tolerated. 4. Continue Pepcid as directed by your doctor. 5. Return to the ER for worsening symptoms, persistent vomiting, fever or other concerns.  Abdominal Pain, Adult Many things can cause abdominal pain. Usually, abdominal pain is not caused by a disease and will improve without treatment. It can often be observed and treated at home. Your health care provider will do a physical exam and possibly order blood tests and X-rays to help determine the seriousness of your pain. However, in many cases, more time must pass before a clear cause of the pain can be found. Before that point, your health care provider may not know if you need more testing or further treatment. HOME CARE INSTRUCTIONS Monitor your abdominal pain for any changes. The following actions may help to alleviate any discomfort you are experiencing:  Only take over-the-counter or prescription medicines as directed by your health care provider.  Do not take laxatives unless directed to do so by your health care provider.  Try a clear liquid diet (broth, tea, or water) as directed by your health care provider. Slowly move to a bland diet as tolerated. SEEK MEDICAL CARE IF:  You have unexplained abdominal pain.  You have abdominal pain associated with nausea or diarrhea.  You have pain when you urinate or have a bowel movement.  You experience abdominal pain that wakes you in the night.  You have abdominal pain that is worsened or improved by eating food.  You have abdominal pain that is worsened with eating fatty foods.  You have a fever. SEEK IMMEDIATE MEDICAL CARE IF:  Your pain does not go away within 2 hours.  You keep throwing up (vomiting).  Your pain is felt only in portions of  the abdomen, such as the right side or the left lower portion of the abdomen.  You pass bloody or black tarry stools. MAKE SURE YOU:  Understand these instructions.  Will watch your condition.  Will get help right away if you are not doing well or get worse.   This information is not intended to replace advice given to you by your health care provider. Make sure you discuss any questions you have with your health care provider.   Document Released: 06/01/2005 Document Revised: 05/13/2015 Document Reviewed: 05/01/2013 Elsevier Interactive Patient Education 2016 [employer]  Flank Pain Flank pain refers to pain that is located on the side of the body between the upper abdomen and the back. The pain may occur over a short period of time (acute) or may be long-term or reoccurring (chronic). It may be mild or severe. Flank pain can be caused by many things. CAUSES  Some of the more common causes of flank pain include:  Muscle strains.   Muscle spasms.   A disease of your spine (vertebral disk disease).   A lung infection (pneumonia).   Fluid around your lungs (pulmonary edema).   A kidney infection.   Kidney stones.   A very painful skin rash caused by the chickenpox virus (shingles).   Gallbladder disease.  [place] care will depend on the cause of your pain. In general,  Rest as directed by your caregiver.  Drink enough fluids to keep your urine clear or pale yellow.  Only take over-the-counter or prescription  medicines as directed by your caregiver. Some medicines may help relieve the pain.  Tell your caregiver about any changes in your pain.  Follow up with your caregiver as directed. SEEK IMMEDIATE MEDICAL CARE IF:   Your pain is not controlled with medicine.   You have new or worsening symptoms.  Your pain increases.   You have abdominal pain.   You have shortness of breath.   You have persistent nausea or vomiting.    You have swelling in your abdomen.   You feel faint or pass out.   You have blood in your urine.  You have a fever or persistent symptoms for more than 2-3 days.  You have a fever and your symptoms suddenly get worse. MAKE SURE YOU:   Understand these instructions.  Will watch your condition.  Will get help right away if you are not doing well or get worse.   This information is not intended to replace advice given to you by your health care provider. Make sure you discuss any questions you have with your health care provider.   Document Released: 10/13/2005 Document Revised: 05/16/2012 Document Reviewed: 04/05/2012 Elsevier Interactive Patient Education 2016 Elsevier Inc.  Urinary Tract Infection Urinary tract infections (UTIs) can develop anywhere along your urinary tract. Your urinary tract is your body's drainage system for removing wastes and extra water. Your urinary tract includes two kidneys, two ureters, a bladder, and a urethra. Your kidneys are a pair of bean-shaped organs. Each kidney is about the size of your fist. They are located below your ribs, one on each side of your spine. CAUSES Infections are caused by microbes, which are microscopic organisms, including fungi, viruses, and bacteria. These organisms are so small that they can only be seen through a microscope. Bacteria are the microbes that most commonly cause UTIs. SYMPTOMS  Symptoms of UTIs may vary by age and gender of the patient and by the location of the infection. Symptoms in young women typically include a frequent and intense urge to urinate and a painful, burning feeling in the bladder or urethra during urination. Older women and men are more likely to be tired, shaky, and weak and have muscle aches and abdominal pain. A fever may mean the infection is in your kidneys. Other symptoms of a kidney infection include pain in your back or sides below the ribs, nausea, and vomiting. DIAGNOSIS To diagnose  a UTI, your caregiver will ask you about your symptoms. Your caregiver will also ask you to provide a urine sample. The urine sample will be tested for bacteria and white blood cells. White blood cells are made by your body to help fight infection. TREATMENT  Typically, UTIs can be treated with medication. Because most UTIs are caused by a bacterial infection, they usually can be treated with the use of antibiotics. The choice of antibiotic and length of treatment depend on your symptoms and the type of bacteria causing your infection. HOME CARE INSTRUCTIONS  If you were prescribed antibiotics, take them exactly as your caregiver instructs you. Finish the medication even if you feel better after you have only taken some of the medication.  Drink enough water and fluids to keep your urine clear or pale yellow.  Avoid caffeine, tea, and carbonated beverages. They tend to irritate your bladder.  Empty your bladder often. Avoid holding urine for long periods of time.  Empty your bladder before and after sexual intercourse.  After a bowel movement, women should cleanse from front to  back. Use each tissue only once. SEEK MEDICAL CARE IF:   You have back pain.  You develop a fever.  Your symptoms do not begin to resolve within 3 days. SEEK IMMEDIATE MEDICAL CARE IF:   You have severe back pain or lower abdominal pain.  You develop chills.  You have nausea or vomiting.  You have continued burning or discomfort with urination. MAKE SURE YOU:   Understand these instructions.  Will watch your condition.  Will get help right away if you are not doing well or get worse.   This information is not intended to replace advice given to you by your health care provider. Make sure you discuss any questions you have with your health care provider.   Document Released: 06/01/2005 Document Revised: 05/13/2015 Document Reviewed: 09/30/2011 Elsevier Interactive Patient Education [employer].

## 2015-09-07 NOTE — ED Notes (Signed)
Patient ambulatory to triage with steady gait, without difficulty or distress noted; pt reports right sided abd pain radiating around into right flank for couple weeks accomp by nausea; st hx of same with diverticulitis and gallbladder

## 2015-09-07 NOTE — ED Notes (Signed)
Patient returned from CT

## 2015-09-07 NOTE — ED Notes (Addendum)
Pt c/o nausea and vomited x1. Verbal order Phenergan 12.5mg  iv obtained from [facility] MD.

## 2015-09-07 NOTE — ED Notes (Signed)
MD at bedside. 

## 2015-09-19 ENCOUNTER — Observation Stay
Admission: EM | Admit: 2015-09-19 | Discharge: 2015-09-21 | Disposition: A | Discharge status: Home / Self Care | Payer: Self-pay | Attending: Internal Medicine | Admitting: Internal Medicine

## 2015-09-19 ENCOUNTER — Observation Stay: Payer: Self-pay

## 2015-09-19 ENCOUNTER — Encounter: Payer: Self-pay | Admitting: Emergency Medicine

## 2015-09-19 ENCOUNTER — Emergency Department: Payer: Self-pay

## 2015-09-19 DIAGNOSIS — G4733 Obstructive sleep apnea (adult) (pediatric): Secondary | ICD-10-CM | POA: Insufficient documentation

## 2015-09-19 DIAGNOSIS — K5732 Diverticulitis of large intestine without perforation or abscess without bleeding: Secondary | ICD-10-CM | POA: Insufficient documentation

## 2015-09-19 DIAGNOSIS — G2581 Restless legs syndrome: Secondary | ICD-10-CM | POA: Insufficient documentation

## 2015-09-19 DIAGNOSIS — I5022 Chronic systolic (congestive) heart failure: Secondary | ICD-10-CM | POA: Insufficient documentation

## 2015-09-19 DIAGNOSIS — I519 Heart disease, unspecified: Secondary | ICD-10-CM | POA: Insufficient documentation

## 2015-09-19 DIAGNOSIS — Z9049 Acquired absence of other specified parts of digestive tract: Secondary | ICD-10-CM | POA: Insufficient documentation

## 2015-09-19 DIAGNOSIS — R112 Nausea with vomiting, unspecified: Secondary | ICD-10-CM | POA: Insufficient documentation

## 2015-09-19 DIAGNOSIS — R05 Cough: Secondary | ICD-10-CM | POA: Insufficient documentation

## 2015-09-19 DIAGNOSIS — Z90710 Acquired absence of both cervix and uterus: Secondary | ICD-10-CM | POA: Insufficient documentation

## 2015-09-19 DIAGNOSIS — Z8041 Family history of malignant neoplasm of ovary: Secondary | ICD-10-CM | POA: Insufficient documentation

## 2015-09-19 DIAGNOSIS — G4730 Sleep apnea, unspecified: Secondary | ICD-10-CM | POA: Insufficient documentation

## 2015-09-19 DIAGNOSIS — Z8614 Personal history of Methicillin resistant Staphylococcus aureus infection: Secondary | ICD-10-CM | POA: Insufficient documentation

## 2015-09-19 DIAGNOSIS — D72829 Elevated white blood cell count, unspecified: Secondary | ICD-10-CM | POA: Insufficient documentation

## 2015-09-19 DIAGNOSIS — R0603 Acute respiratory distress: Secondary | ICD-10-CM | POA: Diagnosis present

## 2015-09-19 DIAGNOSIS — Z808 Family history of malignant neoplasm of other organs or systems: Secondary | ICD-10-CM | POA: Insufficient documentation

## 2015-09-19 DIAGNOSIS — Z91040 Latex allergy status: Secondary | ICD-10-CM | POA: Insufficient documentation

## 2015-09-19 DIAGNOSIS — Z9889 Other specified postprocedural states: Secondary | ICD-10-CM | POA: Insufficient documentation

## 2015-09-19 DIAGNOSIS — J209 Acute bronchitis, unspecified: Secondary | ICD-10-CM | POA: Insufficient documentation

## 2015-09-19 DIAGNOSIS — J9601 Acute respiratory failure with hypoxia: Secondary | ICD-10-CM | POA: Insufficient documentation

## 2015-09-19 DIAGNOSIS — Z91041 Radiographic dye allergy status: Secondary | ICD-10-CM | POA: Insufficient documentation

## 2015-09-19 DIAGNOSIS — Z803 Family history of malignant neoplasm of breast: Secondary | ICD-10-CM | POA: Insufficient documentation

## 2015-09-19 DIAGNOSIS — J4520 Mild intermittent asthma, uncomplicated: Secondary | ICD-10-CM | POA: Insufficient documentation

## 2015-09-19 DIAGNOSIS — R0602 Shortness of breath: Secondary | ICD-10-CM

## 2015-09-19 DIAGNOSIS — Z91048 Other nonmedicinal substance allergy status: Secondary | ICD-10-CM | POA: Insufficient documentation

## 2015-09-19 DIAGNOSIS — I272 Other secondary pulmonary hypertension: Secondary | ICD-10-CM | POA: Insufficient documentation

## 2015-09-19 DIAGNOSIS — Z86718 Personal history of other venous thrombosis and embolism: Secondary | ICD-10-CM | POA: Insufficient documentation

## 2015-09-19 DIAGNOSIS — Z880 Allergy status to penicillin: Secondary | ICD-10-CM | POA: Insufficient documentation

## 2015-09-19 DIAGNOSIS — J4521 Mild intermittent asthma with (acute) exacerbation: Principal | ICD-10-CM | POA: Insufficient documentation

## 2015-09-19 DIAGNOSIS — A419 Sepsis, unspecified organism: Secondary | ICD-10-CM | POA: Insufficient documentation

## 2015-09-19 DIAGNOSIS — R509 Fever, unspecified: Secondary | ICD-10-CM | POA: Insufficient documentation

## 2015-09-19 DIAGNOSIS — R079 Chest pain, unspecified: Secondary | ICD-10-CM | POA: Insufficient documentation

## 2015-09-19 LAB — CBC WITH DIFFERENTIAL/PLATELET
BASOS ABS: 0.1 10*3/uL (ref 0–0.1)
Basophils Relative: 1 %
EOS ABS: 0.4 10*3/uL (ref 0–0.7)
EOS PCT: 2 %
HEMATOCRIT: 37 % (ref 35.0–47.0)
Hemoglobin: 12.1 g/dL (ref 12.0–16.0)
Lymphocytes Relative: 22 %
Lymphs Abs: 3.4 10*3/uL (ref 1.0–3.6)
MCH: 25.4 pg — AB (ref 26.0–34.0)
MCHC: 32.6 g/dL (ref 32.0–36.0)
MCV: 78.1 fL — AB (ref 80.0–100.0)
Monocytes Absolute: 1.1 10*3/uL — ABNORMAL HIGH (ref 0.2–0.9)
Monocytes Relative: 7 %
Neutro Abs: 10.5 10*3/uL — ABNORMAL HIGH (ref 1.4–6.5)
Neutrophils Relative %: 68 %
Platelets: 303 10*3/uL (ref 150–440)
RBC: 4.75 MIL/uL (ref 3.80–5.20)
RDW: 16 % — AB (ref 11.5–14.5)
WBC: 15.4 10*3/uL — AB (ref 3.6–11.0)

## 2015-09-19 LAB — BASIC METABOLIC PANEL
Anion gap: 8 (ref 5–15)
BUN: 15 mg/dL (ref 6–20)
CALCIUM: 9.2 mg/dL (ref 8.9–10.3)
CO2: 27 mmol/L (ref 22–32)
CREATININE: 1 mg/dL (ref 0.44–1.00)
Chloride: 105 mmol/L (ref 101–111)
Glucose, Bld: 107 mg/dL — ABNORMAL HIGH (ref 65–99)
Potassium: 3.2 mmol/L — ABNORMAL LOW (ref 3.5–5.1)
SODIUM: 140 mmol/L (ref 135–145)

## 2015-09-19 LAB — TROPONIN I
Troponin I: <0.03 ng/mL (ref <0.031)
Troponin I: <0.03 ng/mL (ref <0.031)
Troponin I: <0.03 ng/mL (ref <0.031)

## 2015-09-19 LAB — HEMOGLOBIN A1C: Hgb A1c MFr Bld: 5.9 % (ref 4.0–6.0)

## 2015-09-19 LAB — BRAIN NATRIURETIC PEPTIDE: B Natriuretic Peptide: 63 pg/mL (ref 0.0–100.0)

## 2015-09-19 LAB — APTT: aPTT: 29 seconds (ref 24–36)

## 2015-09-19 LAB — TSH: TSH: 1.566 u[IU]/mL (ref 0.350–4.500)

## 2015-09-19 LAB — ETHANOL: Alcohol, Ethyl (B): <5 mg/dL (ref <5)

## 2015-09-19 LAB — PROTIME-INR
INR: 1.04
Prothrombin Time: 13.8 seconds (ref 11.4–15.0)

## 2015-09-19 MED ORDER — BUDESONIDE-FORMOTEROL FUMARATE 160-4.5 MCG/ACT IN AERO
2.0000 | INHALATION_SPRAY | Freq: Two times a day (BID) | RESPIRATORY_TRACT | Status: DC
  Filled 2015-09-19: qty 6

## 2015-09-19 MED ORDER — ALBUTEROL SULFATE (2.5 MG/3ML) 0.083% IN NEBU
5.0000 mg | INHALATION_SOLUTION | Freq: Once | RESPIRATORY_TRACT | Status: AC
  Administered 2015-09-19: 5 mg via RESPIRATORY_TRACT
  Filled 2015-09-19: qty 6

## 2015-09-19 MED ORDER — TECHNETIUM TC 99M DIETHYLENETRIAME-PENTAACETIC ACID
31.8500 | Freq: Once | INTRAVENOUS | Status: AC | PRN
  Administered 2015-09-19: 31.85 via RESPIRATORY_TRACT

## 2015-09-19 MED ORDER — ENOXAPARIN SODIUM 40 MG/0.4ML ~~LOC~~ SOLN: 40.0000 mg | SUBCUTANEOUS | Status: DC

## 2015-09-19 MED ORDER — ACETAMINOPHEN 325 MG PO TABS
650.0000 mg | ORAL_TABLET | Freq: Four times a day (QID) | ORAL | Status: DC | PRN
  Administered 2015-09-19: 650 mg via ORAL
  Filled 2015-09-19: qty 2

## 2015-09-19 MED ORDER — BUDESONIDE 0.25 MG/2ML IN SUSP
0.2500 mg | Freq: Two times a day (BID) | RESPIRATORY_TRACT | Status: DC
  Administered 2015-09-19 – 2015-09-21 (×4): 0.25 mg via RESPIRATORY_TRACT
  Filled 2015-09-19 (×4): qty 2

## 2015-09-19 MED ORDER — LORAZEPAM 2 MG/ML IJ SOLN
1.0000 mg | Freq: Once | INTRAMUSCULAR | Status: AC
  Administered 2015-09-19: 1 mg via INTRAVENOUS
  Filled 2015-09-19: qty 1

## 2015-09-19 MED ORDER — TECHNETIUM TO 99M ALBUMIN AGGREGATED
4.0800 | Freq: Once | INTRAVENOUS | Status: AC | PRN
  Administered 2015-09-19: 4.08 via INTRAVENOUS

## 2015-09-19 MED ORDER — AZITHROMYCIN 250 MG PO TABS
500.0000 mg | ORAL_TABLET | Freq: Every day | ORAL | Status: AC
  Administered 2015-09-20: 10:00:00 500 mg via ORAL
  Filled 2015-09-19: qty 2

## 2015-09-19 MED ORDER — DICYCLOMINE HCL 20 MG PO TABS: 20.0000 mg | ORAL_TABLET | Freq: Four times a day (QID) | ORAL | Status: DC | PRN

## 2015-09-19 MED ORDER — ONDANSETRON HCL 4 MG/2ML IJ SOLN: 4.0000 mg | Freq: Four times a day (QID) | INTRAMUSCULAR | Status: DC | PRN

## 2015-09-19 MED ORDER — POTASSIUM CHLORIDE CRYS ER 20 MEQ PO TBCR
40.0000 meq | EXTENDED_RELEASE_TABLET | ORAL | Status: AC
  Administered 2015-09-19 (×2): 40 meq via ORAL
  Filled 2015-09-19 (×2): qty 2

## 2015-09-19 MED ORDER — MORPHINE SULFATE (PF) 2 MG/ML IV SOLN
2.0000 mg | Freq: Once | INTRAVENOUS | Status: AC
  Administered 2015-09-19: 2 mg via INTRAVENOUS
  Filled 2015-09-19: qty 1

## 2015-09-19 MED ORDER — OXYCODONE-ACETAMINOPHEN 5-325 MG PO TABS
1.0000 | ORAL_TABLET | Freq: Four times a day (QID) | ORAL | Status: DC | PRN
  Administered 2015-09-19 – 2015-09-20 (×4): 1 via ORAL
  Filled 2015-09-19 (×5): qty 1

## 2015-09-19 MED ORDER — ALBUTEROL SULFATE (2.5 MG/3ML) 0.083% IN NEBU
2.5000 mg | INHALATION_SOLUTION | Freq: Four times a day (QID) | RESPIRATORY_TRACT | Status: DC
  Administered 2015-09-19 – 2015-09-21 (×9): 2.5 mg via RESPIRATORY_TRACT
  Filled 2015-09-19 (×9): qty 3

## 2015-09-19 MED ORDER — ONDANSETRON HCL 4 MG PO TABS: 4.0000 mg | ORAL_TABLET | Freq: Four times a day (QID) | ORAL | Status: DC | PRN

## 2015-09-19 MED ORDER — DIPHENHYDRAMINE HCL 25 MG PO CAPS
25.0000 mg | ORAL_CAPSULE | ORAL | Status: DC | PRN
  Administered 2015-09-21 (×2): 25 mg via ORAL
  Filled 2015-09-19 (×3): qty 1

## 2015-09-19 MED ORDER — METHYLPREDNISOLONE SODIUM SUCC 40 MG IJ SOLR
40.0000 mg | Freq: Four times a day (QID) | INTRAMUSCULAR | Status: DC
  Administered 2015-09-19 – 2015-09-21 (×8): 40 mg via INTRAVENOUS
  Filled 2015-09-19 (×8): qty 1

## 2015-09-19 MED ORDER — RIVAROXABAN 15 MG PO TABS
15.0000 mg | ORAL_TABLET | Freq: Once | ORAL | Status: AC
  Administered 2015-09-19: 15 mg via ORAL
  Filled 2015-09-19: qty 1

## 2015-09-19 MED ORDER — LEVOFLOXACIN IN D5W 750 MG/150ML IV SOLN
750.0000 mg | Freq: Once | INTRAVENOUS | Status: AC
  Administered 2015-09-19: 750 mg via INTRAVENOUS
  Filled 2015-09-19: qty 150

## 2015-09-19 MED ORDER — PREDNISONE 20 MG PO TABS
60.0000 mg | ORAL_TABLET | Freq: Once | ORAL | Status: AC
  Administered 2015-09-19: 60 mg via ORAL
  Filled 2015-09-19: qty 3

## 2015-09-19 MED ORDER — SODIUM CHLORIDE 0.9 % IV BOLUS (SEPSIS)
500.0000 mL | Freq: Once | INTRAVENOUS | Status: AC
  Administered 2015-09-19: 500 mL via INTRAVENOUS

## 2015-09-19 MED ORDER — PROMETHAZINE HCL 25 MG/ML IJ SOLN
12.5000 mg | INTRAMUSCULAR | Status: DC | PRN
  Administered 2015-09-19: 12.5 mg via INTRAVENOUS
  Filled 2015-09-19: qty 1

## 2015-09-19 MED ORDER — SODIUM CHLORIDE 0.9 % IJ SOLN
3.0000 mL | Freq: Two times a day (BID) | INTRAMUSCULAR | Status: DC
  Administered 2015-09-19 – 2015-09-21 (×5): 3 mL via INTRAVENOUS

## 2015-09-19 MED ORDER — ACETAMINOPHEN 650 MG RE SUPP: 650.0000 mg | Freq: Four times a day (QID) | RECTAL | Status: DC | PRN

## 2015-09-19 MED ORDER — DOCUSATE SODIUM 100 MG PO CAPS
100.0000 mg | ORAL_CAPSULE | Freq: Two times a day (BID) | ORAL | Status: DC
  Administered 2015-09-19 – 2015-09-21 (×5): 100 mg via ORAL
  Filled 2015-09-19 (×5): qty 1

## 2015-09-19 MED ORDER — HYDROCOD POLST-CPM POLST ER 10-8 MG/5ML PO SUER
5.0000 mL | Freq: Two times a day (BID) | ORAL | Status: DC | PRN
  Administered 2015-09-19 – 2015-09-21 (×5): 5 mL via ORAL
  Filled 2015-09-19 (×5): qty 5

## 2015-09-19 MED ORDER — AZITHROMYCIN 250 MG PO TABS
250.0000 mg | ORAL_TABLET | Freq: Every day | ORAL | Status: DC
  Administered 2015-09-21: 09:00:00 250 mg via ORAL
  Filled 2015-09-19: qty 1

## 2015-09-19 MED ORDER — ACETAMINOPHEN 325 MG PO TABS
650.0000 mg | ORAL_TABLET | Freq: Once | ORAL | Status: AC
  Administered 2015-09-19: 650 mg via ORAL
  Filled 2015-09-19: qty 2

## 2015-09-19 MED ORDER — PROMETHAZINE HCL 25 MG PO TABS
25.0000 mg | ORAL_TABLET | Freq: Four times a day (QID) | ORAL | Status: DC | PRN
  Administered 2015-09-19 (×2): 25 mg via ORAL
  Filled 2015-09-19 (×2): qty 1

## 2015-09-19 MED ORDER — ROPINIROLE HCL 1 MG PO TABS
2.0000 mg | ORAL_TABLET | Freq: Every day | ORAL | Status: DC
  Administered 2015-09-19 – 2015-09-20 (×2): 2 mg via ORAL
  Filled 2015-09-19 (×2): qty 2
  Filled 2015-09-19: qty 8

## 2015-09-19 NOTE — Plan of Care (Signed)
Problem: Education: Goal: Knowledge of [facility] Education information/materials will improve Outcome: Progressing Patient admitted this shift from ER, per patient has been coughing for a week, was having increased SOB, resting well now, tussionex , percocet and potassium given.

## 2015-09-19 NOTE — ED Notes (Signed)
Pt reports fever x1 week ago. Pt states that illness "kind of made this worse". Pt is a/o with labored breathing.

## 2015-09-19 NOTE — Progress Notes (Signed)
Patient ID: Brittany Nguyen, female   DOB: April 06, 1966, 50 y.o.   MRN: [id number] [facility] PROGRESS NOTE  PCP: [name], MD  HPI/Subjective: Patient short of breath since Tuesday. Coughing, wheezing and congestion. Had an episode similar to this a few years ago.  Objective: Filed Vitals:   09/19/15 1022 09/19/15 1231  BP: 111/74 115/70  Pulse: 108 95  Temp: 97.8 F (36.6 C) 97.9 F (36.6 C)  Resp: 16 19    Filed Weights   09/19/15 0032 09/19/15 0519  Weight: 95.255 kg (210 lb) 102.785 kg (226 lb 9.6 oz)    ROS: Review of Systems  Constitutional: Negative for fever and chills.  Eyes: Negative for blurred vision.  Respiratory: Positive for cough, shortness of breath and wheezing.   Cardiovascular: Negative for chest pain.  Gastrointestinal: Negative for nausea, vomiting, abdominal pain, diarrhea and constipation.  Genitourinary: Negative for dysuria.  Musculoskeletal: Negative for joint pain.  Neurological: Negative for dizziness and headaches.   Exam: Physical Exam  Constitutional: She is oriented to person, place, and time.  HENT:  Nose: No mucosal edema.  Mouth/Throat: No oropharyngeal exudate or posterior oropharyngeal edema.  Eyes: Conjunctivae, EOM and lids are normal. Pupils are equal, round, and reactive to light.  Neck: No JVD present. Carotid bruit is not present. No edema present. No thyroid mass and no thyromegaly present.  Cardiovascular: S1 normal and S2 normal.  Exam reveals no gallop.   No murmur heard. Pulses:      Dorsalis pedis pulses are 2+ on the right side, and 2+ on the left side.  Respiratory: No respiratory distress. She has decreased breath sounds in the right upper field, the right middle field, the right lower field, the left upper field, the left middle field and the left lower field. She has wheezes in the right upper field, the right middle field, the right lower field, the left upper field, the left middle field and  the left lower field. She has no rhonchi. She has no rales.  Poor air entry bilaterally.  GI: Soft. Bowel sounds are normal. There is no tenderness.  Musculoskeletal:       Right ankle: She exhibits swelling.       Left ankle: She exhibits swelling.  Lymphadenopathy:    She has no cervical adenopathy.  Neurological: She is alert and oriented to person, place, and time. No cranial nerve deficit.  Skin: Skin is warm. No rash noted. Nails show no clubbing.  Psychiatric: She has a normal mood and affect.      Data Reviewed: Basic Metabolic Panel:  Recent Labs Lab 09/19/15 0036  NA 140  K 3.2*  CL 105  CO2 27  GLUCOSE 107*  BUN 15  CREATININE 1.00  CALCIUM 9.2   CBC:  Recent Labs Lab 09/19/15 0036  WBC 15.4*  NEUTROABS 10.5*  HGB 12.1  HCT 37.0  MCV 78.1*  PLT 303   Cardiac Enzymes:  Recent Labs Lab 09/19/15 0036 09/19/15 0624 09/19/15 1144  TROPONINI <0.03 <0.03 <0.03   BNP (last 3 results)  Recent Labs  09/19/15 0036  BNP 63.0    Studies: Nm Pulmonary Perf And Vent  09/19/2015  CLINICAL DATA:  Fever, cough and difficulty taking deep breath. Anxiety. EXAM: NUCLEAR MEDICINE VENTILATION - PERFUSION LUNG SCAN TECHNIQUE: Ventilation images were obtained in multiple projections using inhaled aerosol Tc-5m DTPA. Perfusion images were obtained in multiple projections after intravenous injection of Tc-85m MAA. RADIOPHARMACEUTICALS:  31.85 mCi Technetium-73m DTPA aerosol  inhalation and 4.08 mCi of Technetium-71m MAA IV COMPARISON:  Chest x-ray 09/19/2015 FINDINGS: Ventilation: No focal ventilation defect. Perfusion: No wedge shaped peripheral perfusion defects to suggest acute pulmonary embolism. IMPRESSION: Normal ventilation perfusion lung scan. Electronically Signed   By: Marin Olp M.D.   On: 09/19/2015 08:55   Dg Chest Port 1 View  09/19/2015  CLINICAL DATA:  50 year old female with chest pain and shortness of breath. EXAM: PORTABLE CHEST 1 VIEW COMPARISON:   Radiograph dated 07/20/2015 FINDINGS: The heart size and mediastinal contours are within normal limits. Both lungs are clear. The visualized skeletal structures are unremarkable. IMPRESSION: No active disease. Electronically Signed   By: Anner Crete M.D.   On: 09/19/2015 01:29    Scheduled Meds: . albuterol  2.5 mg Nebulization Q6H  . budesonide (PULMICORT) nebulizer solution  0.25 mg Nebulization BID  . docusate sodium  100 mg Oral BID  . methylPREDNISolone (SOLU-MEDROL) injection  40 mg Intravenous Q6H  . rOPINIRole  2 mg Oral QHS  . sodium chloride  3 mL Intravenous Q12H    Assessment/Plan:  1. Acute bronchitis with asthmatic component. Start IV Solu-Medrol. Budesonide and albuterol nebulizers. Start a Z-Pak. 2. History of sleep apnea 3. History of CHF- no signs of shoes which F on this hospital stay 4. Restless leg syndrome on Requip  Code Status:     Code Status Orders        Start     Ordered   09/19/15 0512  Full code   Continuous     09/19/15 0511    Code Status History    Date Active Date Inactive Code Status Order ID Comments User Context   This patient has a current code status but no historical code status.     Family Communication: Family at bedside Disposition Plan: Home soon when moving better air  Antibiotics:  Zithromax  Time spent: 35 minutes  Loletha Grayer  [facility]

## 2015-09-19 NOTE — Consult Note (Signed)
Pulmonary Critical Care  Initial Consult Note   Brittany Nguyen [id number] DOB: November 10, 1965 DOA: 09/19/2015  Referring physician: Cordelia Poche, MD PCP: [name], MD   Chief Complaint: Shortness of Breath  HPI: Brittany Nguyen is a 50 y.o. female with prior history of Asthma OSA PAH presents to the hospital with increased shortness of breath.Patient states that she has been ill for of the a week or so.  She was exposed to a sick child her grandchild specifically.  She has been having cough and congestion.  She has been having increased wheezing.  She has been having increased shortness of breath.  She also admits having some nausea.  No vomiting. In the emergency room she was significantly hypoxic inch had increased work of breathing.  She was admitted for further management therapy.  On her chest x-ray there was no evidence of infiltrate or pneumonia.  She states since she has in the hospital has been feeling a little bit better the she is not at baseline   Review of Systems:  Constitutional:  No weight loss, night sweats, Fevers, chills, fatigue.  HEENT:  No headaches, nasal congestion, post nasal drip,  Cardio-vascular:  +chest pain, no Orthopnea, PND, swelling in lower extremities, anasarca, dizziness, palpitations  GI:  No heartburn, indigestion, abdominal pain, nausea, vomiting, diarrhea  Resp:  +shortness of breath with exertion +productive cough, No coughing up of blood.+wheezing Skin:  no rash or lesions.  Musculoskeletal:  No joint pain or swelling.   Remainder ROS performed and is unremarkable other than noted in HPI  Past Medical History  Diagnosis Date  . Hx MRSA infection   . Asthma 2013  . Heart disease 2013  . Diverticulitis 2010  . Lump or mass in breast   . Endometriosis 1990  . Restless leg   . CHF (congestive heart failure) ([place])   . DVT (deep venous thrombosis) [facility])    Past Surgical History  Procedure Laterality Date  . Abdominal  hysterectomy      age 29  . Nasal sinus surgery  2012  . Colectomy    . Cholecystectomy    . Cesarean section     Social History:  reports that she has never smoked. She has never used smokeless tobacco. She reports that she does not drink alcohol or use illicit drugs.  Allergies  Allergen Reactions  . Contrast Media [Iodinated Diagnostic Agents] Shortness Of Breath  . Latex Hives  . Ondansetron Other (See Comments)    Severe headache  . Penicillins Hives    Has patient had a PCN reaction causing immediate rash, facial/tongue/throat swelling, SOB or lightheadedness with hypotension: Yes Has patient had a PCN reaction causing severe rash involving mucus membranes or skin necrosis: No Has patient had a PCN reaction that required hospitalization No Has patient had a PCN reaction occurring within the last 10 years: No If all of the above answers are "NO", then may proceed with Cephalosporin use.  . Povidone-Iodine Rash    Family History  Problem Relation Age of Onset  . Cancer Mother 30    ovarian  . Cancer Father 50    brain  . Cancer Daughter 18    skin  . Cancer Maternal Aunt 34    breast    Prior to Admission medications   Medication Sig Start Date End Date Taking? Authorizing Provider  rOPINIRole (REQUIP) 2 MG tablet Take 1 tablet (2 mg total) by mouth at bedtime. Patient taking differently: Take 4 mg  by mouth at bedtime.  05/14/15  Yes Johnn Hai, PA-C   Physical Exam: Filed Vitals:   09/19/15 0430 09/19/15 0519 09/19/15 1022 09/19/15 1231  BP: 115/79 95/65 111/74 115/70  Pulse: 103 104 108 95  Temp:  97.9 F (36.6 C) 97.8 F (36.6 C) 97.9 F (36.6 C)  TempSrc:  Oral Oral Oral  Resp: 17 18 16 19   Height:      Weight:  102.785 kg (226 lb 9.6 oz)    SpO2: 96% 98% 95% 98%    Wt Readings from Last 3 Encounters:  09/19/15 102.785 kg (226 lb 9.6 oz)  09/07/15 90.719 kg (200 lb)  07/20/15 95.255 kg (210 lb)    General:  Appears calm and comfortable Eyes:  PERRL, normal lids, irises & conjunctiva ENT: grossly normal hearing, lips & tongue Neck: no LAD, masses or thyromegaly Cardiovascular: RRR, no m/r/g. No LE edema. Respiratory:  rhonchi noted. Coarse breath sounds bilaterally. Percussion was resonant.. Abdomen: soft, nontender Skin: no rash or induration seen on limited exam Musculoskeletal: grossly normal tone BUE/BLE Psychiatric: grossly normal mood and affect Neurologic: grossly non-focal.          Labs on Admission:  Basic Metabolic Panel:  Recent Labs Lab 09/19/15 0036  NA 140  K 3.2*  CL 105  CO2 27  GLUCOSE 107*  BUN 15  CREATININE 1.00  CALCIUM 9.2   Liver Function Tests: No results for input(s): AST, ALT, ALKPHOS, BILITOT, PROT, ALBUMIN in the last 168 hours. No results for input(s): LIPASE, AMYLASE in the last 168 hours. No results for input(s): AMMONIA in the last 168 hours. CBC:  Recent Labs Lab 09/19/15 0036  WBC 15.4*  NEUTROABS 10.5*  HGB 12.1  HCT 37.0  MCV 78.1*  PLT 303   Cardiac Enzymes:  Recent Labs Lab 09/19/15 0036 09/19/15 0624 09/19/15 1144  TROPONINI <0.03 <0.03 <0.03    BNP (last 3 results)  Recent Labs  09/19/15 0036  BNP 63.0    ProBNP (last 3 results) No results for input(s): PROBNP in the last 8760 hours.  CBG: No results for input(s): GLUCAP in the last 168 hours.  Radiological Exams on Admission: Nm Pulmonary Perf And Vent  09/19/2015  CLINICAL DATA:  Fever, cough and difficulty taking deep breath. Anxiety. EXAM: NUCLEAR MEDICINE VENTILATION - PERFUSION LUNG SCAN TECHNIQUE: Ventilation images were obtained in multiple projections using inhaled aerosol Tc-35m DTPA. Perfusion images were obtained in multiple projections after intravenous injection of Tc-89m MAA. RADIOPHARMACEUTICALS:  31.85 mCi Technetium-9m DTPA aerosol inhalation and 4.08 mCi of Technetium-24m MAA IV COMPARISON:  Chest x-ray 09/19/2015 FINDINGS: Ventilation: No focal ventilation defect. Perfusion:  No wedge shaped peripheral perfusion defects to suggest acute pulmonary embolism. IMPRESSION: Normal ventilation perfusion lung scan. Electronically Signed   By: Marin Olp M.D.   On: 09/19/2015 08:55   Dg Chest Port 1 View  09/19/2015  CLINICAL DATA:  50 year old female with chest pain and shortness of breath. EXAM: PORTABLE CHEST 1 VIEW COMPARISON:  Radiograph dated 07/20/2015 FINDINGS: The heart size and mediastinal contours are within normal limits. Both lungs are clear. The visualized skeletal structures are unremarkable. IMPRESSION: No active disease. Electronically Signed   By: Anner Crete M.D.   On: 09/19/2015 01:29    EKG: Independently reviewed.  Assessment/Plan Active Problems:   Respiratory distress   1. Acute respiratory failure with hypoxia -  Titrate oxygen as necessary -Blood gases as needed. Currently appears to be stable  2. Mild intermittent Asthma Exacerbation -  Clinically improving will continue to monitor - continue with steroid - will need to follow up in the outpatient setting.  She normally sees Dr. Raul Del  Code Status: full code   Disposition Plan: home     I have personally obtained a history, examined the patient, evaluated laboratory and imaging results, formulated the assessment and plan and placed orders.  The Patient requires high complexity decision making for assessment and support.    Allyne Gee, MD [facility] Pulmonary Critical Care Medicine Sleep Medicine

## 2015-09-19 NOTE — H&P (Signed)
Brittany Nguyen is an 50 y.o. female.   Chief Complaint: Shortness of breath HPI: The patient presents emergency department complaining of shortness of breath. Past medical history is significant for asthma that began as an adult, obstructive sleep apnea, and borderline pulmonary hypertension. She states that she has been coughing and wheezing for the last 5 days. She has felt fevers and weakness that prevented her from going to work for 2 days this week. She admits to a few episodes of nausea with nonbloody nonbilious emesis. 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 In the emergency department the patient received multiple breathing treatments as well as steroids but continued to have wheezing and tachypnea. She is maintaining her oxygen saturations on room air. She denies chest pain. Due to continued tachypnea and increased work of breathing emergency department staff called for admission.  Past Medical History  Diagnosis Date  . Hx MRSA infection   . Asthma 2013  . Heart disease 2013  . Diverticulitis 2010  . Lump or mass in breast   . Endometriosis 1990  . Restless leg   . CHF (congestive heart failure) ([place])   . DVT (deep venous thrombosis) [facility])     Past Surgical History  Procedure Laterality Date  . Abdominal hysterectomy      age 36  . Nasal sinus surgery  2012  . Colectomy    . Cholecystectomy    . Cesarean section      Family History  Problem Relation Age of Onset  . Cancer Mother 30    ovarian  . Cancer Father 53    brain  . Cancer Daughter 18    skin  . Cancer Maternal Aunt 21    breast   Social History:  reports that she has never smoked. She has never used smokeless tobacco. She reports that she does not drink alcohol or use illicit drugs.  Allergies:  Allergies  Allergen Reactions  . Contrast Media [Iodinated Diagnostic Agents] Shortness Of Breath  . Latex Hives  . Ondansetron Other (See Comments)     Severe headache  . Penicillins Hives    Has patient had a PCN reaction causing immediate rash, facial/tongue/throat swelling, SOB or lightheadedness with hypotension: Yes Has patient had a PCN reaction causing severe rash involving mucus membranes or skin necrosis: No Has patient had a PCN reaction that required hospitalization No Has patient had a PCN reaction occurring within the last 10 years: No If all of the above answers are "NO", then may proceed with Cephalosporin use.  . Povidone-Iodine Rash    Medications Prior to Admission  Medication Sig Dispense Refill  . rOPINIRole (REQUIP) 2 MG tablet Take 1 tablet (2 mg total) by mouth at bedtime. (Patient taking differently: Take 4 mg by mouth at bedtime. ) 30 tablet 0    Results for orders placed or performed during the hospital encounter of 09/19/15 (from the past 48 hour(s))  Ethanol     Status: None   Collection Time: 09/19/15 12:36 AM  Result Value Ref Range   Alcohol, Ethyl (B) <5 <5 mg/dL    Comment:        LOWEST DETECTABLE LIMIT FOR SERUM ALCOHOL IS 5 mg/dL FOR MEDICAL PURPOSES ONLY   Brain natriuretic peptide     Status: None   Collection Time: 09/19/15 12:36 AM  Result Value Ref Range   B Natriuretic Peptide 63.0 0.0 - 100.0 pg/mL  Basic metabolic panel  Status: Abnormal   Collection Time: 09/19/15 12:36 AM  Result Value Ref Range   Sodium 140 135 - 145 mmol/L   Potassium 3.2 (L) 3.5 - 5.1 mmol/L   Chloride 105 101 - 111 mmol/L   CO2 27 22 - 32 mmol/L   Glucose, Bld 107 (H) 65 - 99 mg/dL   BUN 15 6 - 20 mg/dL   Creatinine, Ser 1.00 0.44 - 1.00 mg/dL   Calcium 9.2 8.9 - 10.3 mg/dL   GFR calc non Af Amer >60 >60 mL/min   GFR calc Af Amer >60 >60 mL/min    Comment: (NOTE) The eGFR has been calculated using the CKD EPI equation. This calculation has not been validated in all clinical situations. eGFR's persistently <60 mL/min signify possible Chronic Kidney Disease.    Anion gap 8 5 - 15  Troponin I      Status: None   Collection Time: 09/19/15 12:36 AM  Result Value Ref Range   Troponin I <0.03 <0.031 ng/mL    Comment:        NO INDICATION OF MYOCARDIAL INJURY.   CBC with Differential     Status: Abnormal   Collection Time: 09/19/15 12:36 AM  Result Value Ref Range   WBC 15.4 (H) 3.6 - 11.0 K/uL   RBC 4.75 3.80 - 5.20 MIL/uL   Hemoglobin 12.1 12.0 - 16.0 g/dL   HCT 37.0 35.0 - 47.0 %   MCV 78.1 (L) 80.0 - 100.0 fL   MCH 25.4 (L) 26.0 - 34.0 pg   MCHC 32.6 32.0 - 36.0 g/dL   RDW 16.0 (H) 11.5 - 14.5 %   Platelets 303 150 - 440 K/uL   Neutrophils Relative % 68 %   Neutro Abs 10.5 (H) 1.4 - 6.5 K/uL   Lymphocytes Relative 22 %   Lymphs Abs 3.4 1.0 - 3.6 K/uL   Monocytes Relative 7 %   Monocytes Absolute 1.1 (H) 0.2 - 0.9 K/uL   Eosinophils Relative 2 %   Eosinophils Absolute 0.4 0 - 0.7 K/uL   Basophils Relative 1 %   Basophils Absolute 0.1 0 - 0.1 K/uL  Protime-INR     Status: None   Collection Time: 09/19/15 12:36 AM  Result Value Ref Range   Prothrombin Time 13.8 11.4 - 15.0 seconds   INR 1.04   APTT     Status: None   Collection Time: 09/19/15 12:36 AM  Result Value Ref Range   aPTT 29 24 - 36 seconds   Dg Chest Port 1 View  09/19/2015  CLINICAL DATA:  50 year old female with chest pain and shortness of breath. EXAM: PORTABLE CHEST 1 VIEW COMPARISON:  Radiograph dated 07/20/2015 FINDINGS: The heart size and mediastinal contours are within normal limits. Both lungs are clear. The visualized skeletal structures are unremarkable. IMPRESSION: No active disease. Electronically Signed   By: Anner Crete M.D.   On: 09/19/2015 01:29    Review of Systems  Constitutional: Positive for fever and malaise/fatigue. Negative for chills.  HENT: Negative for sore throat and tinnitus.   Eyes: Negative for blurred vision and redness.  Respiratory: Positive for cough and shortness of breath.   Cardiovascular: Positive for chest pain. Negative for palpitations, orthopnea and PND.   Gastrointestinal: Negative for nausea, vomiting, abdominal pain and diarrhea.  Genitourinary: Negative for dysuria, urgency and frequency.  Musculoskeletal: Negative for myalgias and joint pain.  Skin: Negative for rash.       No lesions  Neurological: Negative for speech change,  focal weakness and weakness.  Endo/Heme/Allergies: Does not bruise/bleed easily.       No temperature intolerance  Psychiatric/Behavioral: Negative for depression and suicidal ideas.    Blood pressure 95/65, pulse 104, temperature 97.9 F (36.6 C), temperature source Oral, resp. rate 18, height _0  (1.651 m), weight 102.785 kg (226 lb 9.6 oz), SpO2 98 %. Physical Exam  Vitals reviewed. Constitutional: She is oriented to person, place, and time. She appears well-developed and well-nourished. No distress.  HENT:  Head: Normocephalic and atraumatic.  Mouth/Throat: Oropharynx is clear and moist.  Eyes: Conjunctivae and EOM are normal. Pupils are equal, round, and reactive to light. No scleral icterus.  Neck: Normal range of motion. Neck supple. No JVD present. No tracheal deviation present. No thyromegaly present.  Cardiovascular: Normal rate, regular rhythm and normal heart sounds.  Exam reveals no gallop and no friction rub.   No murmur heard. Respiratory: Effort normal. Tachypnea noted. She has wheezes (Throughout all lung fields bilaterally; moderate air movement).  GI: Soft. Bowel sounds are normal. She exhibits no distension. There is no tenderness.  Genitourinary:  Deferred  Musculoskeletal: Normal range of motion. She exhibits no edema.  Lymphadenopathy:    She has no cervical adenopathy.  Neurological: She is alert and oriented to person, place, and time. No cranial nerve deficit. She exhibits normal muscle tone.  Skin: Skin is warm and dry. No rash noted. No erythema.  Psychiatric: She has a normal mood and affect. Her behavior is normal. Judgment and thought content normal.      Assessment/Plan This is a 50 year old Caucasian female admitted for shortness of breath and tachypnea. 1. Shortness of breath: The patient continues to be tachypneic and has wheezing throughout her lung fields bilaterally. She also has moderate to poor air movement. We will continue breathing treatments and I place the patient on a steroid taper. The patient had been prescribed an inhaled corticosteroid as an outpatient but was unable to refill the prescription. I will restart Symbicort. Pulmonology consult has also been ordered. 2. Leukocytosis: The patient's chest x-ray is not showing any infiltrate. She is received steroids in the emergency department but it appears that blood work has been obtained prior to steroid treatment. Patient is currently afebrile. This finding may be viral which was the catalyst for this episode of bronchitis. Will obtain a differential on her CBC. She was given a dose of IV antibiotics in the emergency department but I will defer continued antimicrobial therapy until we have further information. Cultures have been obtained as the patient technically meets criteria for sepsis. 3. Sepsis: The patient is hemodynamically stable. The supportive criteria of tachypnea may be secondary to bronchitis/reactive airways. Plan as above 4. Chronic systolic heart failure: Stable 5. Obstructive sleep apnea: The patient admits to not using her CPAP machine. I have informed her that this exacerbates her congestive heart failure as well as pulmonary hypertension. 6. DVT prophylaxis: Xarelto (due to recurrent/chronic DVT) 67. GI prophylaxis: None The patient is a full code. Time spent on admission orders and patient care approximately 45 minutes  Harrie Foreman 09/19/2015, 6:07 AM

## 2015-09-19 NOTE — ED Notes (Signed)
Consult at bedside.

## 2015-09-19 NOTE — ED Provider Notes (Addendum)
[facility] Emergency Department Provider Note  ____________________________________________   I have reviewed the triage vital signs and the nursing notes.   HISTORY  Chief Complaint Shortness of Breath    HPI Brittany Nguyen is a 50 y.o. female  a history of chronic cough and shortness of breath he states that she has been evaluated by pulmonology and cardiology for the last year because she "never can take a deep breath". Has history of panic attacks, used to be thought that she had a history of CHF but her last ultrasound of her heart showed normal EF with no evidence of CHF and she was taken to coming off of her diuretic 6 month ago. Patient is in today complaining of cough, which is productive with URI symptoms and fever over the last week. Patient states she cannot get a deep breath. She denies any chest pain, she denies any leg swelling. She is no longer on anticoagulation although she does have a history of DVT in the past. She is allergic to IVP dye and cannot have CT scans. Patient states that she does feel that she is anxious.  Past Medical History  Diagnosis Date  . Hx MRSA infection   . Asthma 2013  . Heart disease 2013  . Diverticulitis 2010  . Lump or mass in breast   . Endometriosis 1990  . Restless leg   . CHF (congestive heart failure) ([place])   . DVT (deep venous thrombosis) [facility])     Patient Active Problem List   Diagnosis Date Noted  . Breast pain 01/11/2013  . Hx MRSA infection   . Lump or mass in breast   . GOITER, MULTINODULAR 01/20/2009  . HYPOGLYCEMIA, UNSPECIFIED 01/20/2009  . GERD 01/20/2009  . DIVERTICULITIS OF COLON 01/20/2009    Past Surgical History  Procedure Laterality Date  . Abdominal hysterectomy      age 20  . Nasal sinus surgery  2012  . Abdominal surgery    . Cholecystectomy    . Cesarean section      Current Outpatient Rx  Name  Route  Sig  Dispense  Refill  . ciprofloxacin (CIPRO) 500 MG tablet    Oral   Take 1 tablet (500 mg total) by mouth 2 (two) times daily.   14 tablet   0   . dicyclomine (BENTYL) 20 MG tablet   Oral   Take 1 tablet (20 mg total) by mouth every 6 (six) hours as needed.   20 tablet   0   . ondansetron (ZOFRAN) 4 MG tablet   Oral   Take 1 tablet (4 mg total) by mouth daily as needed for nausea or vomiting. Patient not taking: Reported on 07/20/2015   20 tablet   0   . oxyCODONE-acetaminophen (ROXICET) 5-325 MG per tablet   Oral   Take 1 tablet by mouth every 6 (six) hours as needed.   8 tablet   0   . promethazine (PHENERGAN) 25 MG tablet   Oral   Take 1 tablet (25 mg total) by mouth every 6 (six) hours as needed for nausea or vomiting.   20 tablet   0   . rOPINIRole (REQUIP) 2 MG tablet   Oral   Take 1 tablet (2 mg total) by mouth at bedtime. Patient taking differently: Take 4 mg by mouth at bedtime.    30 tablet   0   . sulfamethoxazole-trimethoprim (BACTRIM DS,SEPTRA DS) 800-160 MG per tablet   Oral  Take 1 tablet by mouth 2 (two) times daily. Patient not taking: Reported on 07/16/2015   20 tablet   0     Allergies Contrast media; Latex; Ondansetron; Penicillins; and Povidone-iodine  Family History  Problem Relation Age of Onset  . Cancer Mother 30    ovarian  . Cancer Father 28    brain  . Cancer Daughter 18    skin  . Cancer Maternal Aunt 53    breast    Social History Social History  Substance Use Topics  . Smoking status: Never Smoker   . Smokeless tobacco: Never Used  . Alcohol Use: No    Review of Systems Constitutional: No fever/chills Eyes: No visual changes. ENT: No sore throat. No stiff neck no neck pain Cardiovascular: Denies chest pain. Respiratory: See history of present illness Gastrointestinal:   no vomiting.  No diarrhea.  No constipation. Genitourinary: Negative for dysuria. Musculoskeletal: Negative lower extremity swelling Skin: Negative for rash. Neurological: Negative for headaches,  focal weakness or numbness. 10-point ROS otherwise negative.  ____________________________________________   PHYSICAL EXAM:  VITAL SIGNS: ED Triage Vitals  Enc Vitals Group     BP 09/19/15 0032 119/89 mmHg     Pulse Rate 09/19/15 0032 135     Resp 09/19/15 0032 21     Temp 09/19/15 0032 97.9 F (36.6 C)     Temp Source 09/19/15 0032 Oral     SpO2 09/19/15 0032 100 %     Weight 09/19/15 0032 210 lb (95.255 kg)     Height 09/19/15 0032 5\' 5"  (1.651 m)     Head Cir --      Peak Flow --      Pain Score 09/19/15 0034 7     Pain Loc --      Pain Edu? --      Excl. in [place]? --     Constitutional: Alert and oriented. Very well-appearing, patient is very anxious and upset, hyperventilating but when I encourage her to breathe slowly she can do it and it seems to help her. Eyes: Conjunctivae are normal. PERRL. EOMI. Head: Atraumatic. Nose: No congestion/rhinnorhea. Mouth/Throat: Mucous membranes are moist.  Oropharynx non-erythematous. Neck: No stridor.   Nontender with no meningismus Cardiovascular: Tachycardia noted, regular rhythm. Grossly normal heart sounds.  Good peripheral circulation. Respiratory: Normal respiratory effort.  No retractions. Lungs CTAB. Abdominal: Soft and nontender. No distention. No guarding no rebound Back:  There is no focal tenderness or step off there is no midline tenderness there are no lesions noted. there is no CVA tenderness Musculoskeletal: No lower extremity tenderness. No joint effusions, no DVT signs strong distal pulses no edema Neurologic:  Normal speech and language. No gross focal neurologic deficits are appreciated.  Skin:  Skin is warm, dry and intact. No rash noted. Psychiatric: Mood and affect are normal. Speech and behavior are normal.  ____________________________________________   LABS (all labs ordered are listed, but only abnormal results are displayed)  Labs Reviewed  BASIC METABOLIC PANEL - Abnormal; Notable for the following:     Potassium 3.2 (*)    Glucose, Bld 107 (*)    All other components within normal limits  CBC WITH DIFFERENTIAL/PLATELET - Abnormal; Notable for the following:    WBC 15.4 (*)    MCV 78.1 (*)    MCH 25.4 (*)    RDW 16.0 (*)    Neutro Abs 10.5 (*)    Monocytes Absolute 1.1 (*)    All other components within  normal limits  ETHANOL  BRAIN NATRIURETIC PEPTIDE  TROPONIN I  PROTIME-INR  APTT   ____________________________________________  EKG  I personally interpreted any EKGs ordered by me or triage Initial EKG is likely sinus rhythm, artifact limits investigation. Rate is 137, tachycardia. No ste no ST depression Next EKG shows sinus rhythm, rate of 128, with no acute ST elevation or depression, LAD noted. ____________________________________________  [id number]  I reviewed any imaging ordered by me or triage that were performed during my shift ____________________________________________   PROCEDURES  Procedure(s) performed: None  Critical Care performed: None  ____________________________________________   INITIAL IMPRESSION / ASSESSMENT AND PLAN / ED COURSE  Pertinent labs & imaging results that were available during my care of the patient were reviewed by me and considered in my medical decision making (see chart for details).  Patient with cough, fever, and a sensation that she cannot take a deep breath which she states is been there and some degree or another for 1 year. The patient does not have any evidence of an acute DVT at this time, she was tachycardic but she was very very anxious when she came in. I did give her some anxiolytic medication, and IV fluid given no evidence of CHF and her heart rate is now 110. Concern does always exist in this patient for PE, I can obtain a VQ scan but I cannot do a CT scan. We will obtain therefore a VQ scan. I do not think at this time that she requires empiric anticoagulation given the cough and fever. Patient states "I just  keep coughing". She does have a history of recurrent orchitis has been seen multiple times by her primary care doctor and others for this same complaint. This is essentially the reason she has a pulmonologist. In any event, we will obtain a V/Q, patient is resting comfortably at this time lungs remain clear chest x-ray is reassuring blood work is reassuring. We will observe her closely pending further information.  ----------------------------------------- 3:03 AM on 09/19/2015 -----------------------------------------  Patient's heart rate is 108, she is much calmer, lungs are still clear, respiratory rate is normalizing, patient states she feels better. Again bronchitic cough has been noted while she is here. Nonetheless, given history of DVT or trying to get a VQ scan. That will not be available until the morning time. We are giving her anticoagulation therefore pending definitive rule out of PE. The patient will be admitted to the hospital for further evaluation of her respiratory distress. Patient has no Indication to anticoagulation specifically she is not suffering from GI bleed or recent surgery etc. Pt has chest 'soreness, which is reproducible and she states is from coughing.  We will give her something for pain.  ____________________________________________   FINAL CLINICAL IMPRESSION(S) / ED DIAGNOSES  Final diagnoses:  SOB (shortness of breath)      Schuyler Amor, MD 09/19/15 [id number]  Schuyler Amor, MD 09/19/15 [id number]  Schuyler Amor, MD 09/19/15 [phone]

## 2015-09-20 MED ORDER — MAGNESIUM SULFATE 2 GM/50ML IV SOLN
2.0000 g | Freq: Once | INTRAVENOUS | Status: AC
  Administered 2015-09-20: 2 g via INTRAVENOUS
  Filled 2015-09-20: qty 50

## 2015-09-20 MED ORDER — DIPHENHYDRAMINE HCL 50 MG/ML IJ SOLN
12.5000 mg | Freq: Once | INTRAMUSCULAR | Status: AC
  Administered 2015-09-20: 12.5 mg via INTRAVENOUS
  Filled 2015-09-20: qty 1

## 2015-09-20 MED ORDER — IBUPROFEN 600 MG PO TABS: 600.0000 mg | ORAL_TABLET | Freq: Four times a day (QID) | ORAL | Status: DC | PRN

## 2015-09-20 MED ORDER — TRAMADOL HCL 50 MG PO TABS
50.0000 mg | ORAL_TABLET | Freq: Four times a day (QID) | ORAL | Status: DC | PRN
  Administered 2015-09-20 – 2015-09-21 (×2): 50 mg via ORAL
  Filled 2015-09-20 (×2): qty 1

## 2015-09-20 MED ORDER — KETOROLAC TROMETHAMINE 15 MG/ML IJ SOLN
15.0000 mg | Freq: Once | INTRAMUSCULAR | Status: AC
  Administered 2015-09-20: 15 mg via INTRAVENOUS
  Filled 2015-09-20: qty 1

## 2015-09-20 NOTE — Plan of Care (Signed)
Problem: Pain Managment: Goal: General experience of comfort will improve Outcome: Progressing PRN medication for pain with relief. Tussionex for coughing with relief. Solu-medrol Q 6 hrs. Scheduled breathing tx's. Continue to monitor.

## 2015-09-20 NOTE — Progress Notes (Signed)
Patient ID: Brittany Nguyen, female   DOB: [birth year], 50 y.o.   MRN: [id number] [facility] PROGRESS NOTE  Brittany Nguyen [id number] DOB: Mar 05, 1966 DOA: 09/19/2015 PCP: [name], MD  HPI/Subjective: Patient feels a little bit better than yesterday but still short of breath and having coughing fits. Still wheezing. Scared to go home.  Objective: Filed Vitals:   09/19/15 2103 09/20/15 0540  BP: 111/71 96/56  Pulse: 118 95  Temp: 98 F (36.7 C) 98.1 F (36.7 C)  Resp: 18 18    Filed Weights   09/19/15 0032 09/19/15 0519 09/20/15 0540  Weight: 95.255 kg (210 lb) 102.785 kg (226 lb 9.6 oz) 102.558 kg (226 lb 1.6 oz)    ROS: Review of Systems  Constitutional: Negative for fever and chills.  Eyes: Negative for blurred vision.  Respiratory: Positive for cough, shortness of breath and wheezing.   Cardiovascular: Negative for chest pain.  Gastrointestinal: Negative for nausea, vomiting, abdominal pain, diarrhea and constipation.  Genitourinary: Negative for dysuria.  Musculoskeletal: Negative for joint pain.  Neurological: Negative for dizziness and headaches.   Exam: Physical Exam  Constitutional: She is oriented to person, place, and time.  HENT:  Nose: No mucosal edema.  Mouth/Throat: No oropharyngeal exudate or posterior oropharyngeal edema.  Eyes: Conjunctivae, EOM and lids are normal. Pupils are equal, round, and reactive to light.  Neck: No JVD present. Carotid bruit is not present. No edema present. No thyroid mass and no thyromegaly present.  Cardiovascular: S1 normal and S2 normal.  Exam reveals no gallop.   No murmur heard. Pulses:      Dorsalis pedis pulses are 2+ on the right side, and 2+ on the left side.  Respiratory: No respiratory distress. She has decreased breath sounds in the right middle field, the right lower field, the left middle field and the left lower field. She has wheezes in the right middle field, the right lower  field, the left middle field and the left lower field. She has no rhonchi. She has no rales.  GI: Soft. Bowel sounds are normal. There is no tenderness.  Musculoskeletal:       Right ankle: She exhibits no swelling.       Left ankle: She exhibits no swelling.  Lymphadenopathy:    She has no cervical adenopathy.  Neurological: She is alert and oriented to person, place, and time. No cranial nerve deficit.  Skin: Skin is warm. No rash noted. Nails show no clubbing.  Psychiatric: She has a normal mood and affect.      Data Reviewed: Basic Metabolic Panel:  Recent Labs Lab 09/19/15 0036  NA 140  K 3.2*  CL 105  CO2 27  GLUCOSE 107*  BUN 15  CREATININE 1.00  CALCIUM 9.2   LCBC:  Recent Labs Lab 09/19/15 0036  WBC 15.4*  NEUTROABS 10.5*  HGB 12.1  HCT 37.0  MCV 78.1*  PLT 303   Cardiac Enzymes:  Recent Labs Lab 09/19/15 0036 09/19/15 0624 09/19/15 1144 09/19/15 1820  TROPONINI <0.03 <0.03 <0.03 <0.03   BNP (last 3 results)  Recent Labs  09/19/15 0036  BNP 63.0     Recent Results (from the past 240 hour(s))  Culture, blood (routine x 2)     Status: None (Preliminary result)   Collection Time: 09/19/15 12:36 AM  Result Value Ref Range Status   Specimen Description BLOOD RIGHT ASSIST CONTROL  Final   Special Requests BOTTLES DRAWN AEROBIC AND ANAEROBIC [place]  Final  Culture NO GROWTH < 12 HOURS  Final   Report Status PENDING  Incomplete  Culture, blood (routine x 2)     Status: None (Preliminary result)   Collection Time: 09/19/15  2:48 AM  Result Value Ref Range Status   Specimen Description BLOOD ARM  Final   Special Requests BOTTLES DRAWN AEROBIC AND ANAEROBIC 10CC  Final   Culture NO GROWTH < 12 HOURS  Final   Report Status PENDING  Incomplete     Studies: Nm Pulmonary Perf And Vent  09/19/2015  CLINICAL DATA:  Fever, cough and difficulty taking deep breath. Anxiety. EXAM: NUCLEAR MEDICINE VENTILATION - PERFUSION LUNG SCAN TECHNIQUE:  Ventilation images were obtained in multiple projections using inhaled aerosol Tc-81m DTPA. Perfusion images were obtained in multiple projections after intravenous injection of Tc-25m MAA. RADIOPHARMACEUTICALS:  31.85 mCi Technetium-73m DTPA aerosol inhalation and 4.08 mCi of Technetium-87m MAA IV COMPARISON:  Chest x-ray 09/19/2015 FINDINGS: Ventilation: No focal ventilation defect. Perfusion: No wedge shaped peripheral perfusion defects to suggest acute pulmonary embolism. IMPRESSION: Normal ventilation perfusion lung scan. Electronically Signed   By: Marin Olp M.D.   On: 09/19/2015 08:55   Dg Chest Port 1 View  09/19/2015  CLINICAL DATA:  50 year old female with chest pain and shortness of breath. EXAM: PORTABLE CHEST 1 VIEW COMPARISON:  Radiograph dated 07/20/2015 FINDINGS: The heart size and mediastinal contours are within normal limits. Both lungs are clear. The visualized skeletal structures are unremarkable. IMPRESSION: No active disease. Electronically Signed   By: Anner Crete M.D.   On: 09/19/2015 01:29    Scheduled Meds: . albuterol  2.5 mg Nebulization Q6H  . [START ON 09/21/2015] azithromycin  250 mg Oral Daily  . budesonide (PULMICORT) nebulizer solution  0.25 mg Nebulization BID  . docusate sodium  100 mg Oral BID  . methylPREDNISolone (SOLU-MEDROL) injection  40 mg Intravenous Q6H  . rOPINIRole  2 mg Oral QHS  . sodium chloride  3 mL Intravenous Q12H    Assessment/Plan:  1. Acute asthmatic bronchitis with leukocytosis- continue budesonide and albuterol nebulizers and Solu-Medrol. Still having quite a bit of coughing with deep breath. Still has quite a bit of bronchospasm. Continue to evaluate daily for potential discharge home. 2. Objective sleep apnea 3. History of CHF no signs at this point 4. Restless leg syndrome on Requip  Code Status:     Code Status Orders        Start     Ordered   09/19/15 0512  Full code   Continuous     09/19/15 0511    Code  Status History    Date Active Date Inactive Code Status Order ID Comments User Context   This patient has a current code status but no historical code status.     Family Communication: Spoke with family yesterday Disposition Plan: Home once breathing better  Antibiotics:  Zithromax  Time spent: 25 minutes  Loletha Grayer  [facility] [place] [facility]

## 2015-09-21 MED ORDER — ALBUTEROL SULFATE HFA 108 (90 BASE) MCG/ACT IN AERS: 2.0000 | INHALATION_SPRAY | Freq: Four times a day (QID) | RESPIRATORY_TRACT | Status: DC | PRN

## 2015-09-21 MED ORDER — AZITHROMYCIN 250 MG PO TABS: ORAL_TABLET | ORAL | Status: DC

## 2015-09-21 MED ORDER — PREDNISONE 20 MG PO TABS
40.0000 mg | ORAL_TABLET | Freq: Every day | ORAL | Status: DC
  Administered 2015-09-21: 40 mg via ORAL
  Filled 2015-09-21: qty 2

## 2015-09-21 MED ORDER — HYDROCOD POLST-CPM POLST ER 10-8 MG/5ML PO SUER: 5.0000 mL | Freq: Two times a day (BID) | ORAL | Status: DC | PRN

## 2015-09-21 MED ORDER — ALBUTEROL SULFATE (2.5 MG/3ML) 0.083% IN NEBU: 2.5000 mg | INHALATION_SOLUTION | Freq: Four times a day (QID) | RESPIRATORY_TRACT | Status: DC

## 2015-09-21 MED ORDER — PREDNISONE 20 MG PO TABS: ORAL_TABLET | ORAL | Status: DC

## 2015-09-21 MED ORDER — FLUTICASONE PROPIONATE HFA 220 MCG/ACT IN AERO: 1.0000 | INHALATION_SPRAY | Freq: Two times a day (BID) | RESPIRATORY_TRACT | Status: DC

## 2015-09-21 NOTE — Discharge Summary (Signed)
[place] [facility] [place] NAME: Brittany Nguyen    MR#:  [id number]  DATE OF BIRTH:  [birth year]  DATE OF ADMISSION:  09/19/2015 ADMITTING PHYSICIAN: Harrie Foreman, MD  DATE OF DISCHARGE: 09/21/2015  PRIMARY CARE PHYSICIAN: [name], MD    ADMISSION DIAGNOSIS:  SOB (shortness of breath) [R06.02]  DISCHARGE DIAGNOSIS:  Active Problems:   Respiratory distress   SECONDARY DIAGNOSIS:   Past Medical History  Diagnosis Date  . Hx MRSA infection   . Asthma 2013  . Heart disease 2013  . Diverticulitis 2010  . Lump or mass in breast   . Endometriosis 1990  . Restless leg   . CHF (congestive heart failure) ([place])   . DVT (deep venous thrombosis) ([place])     HOSPITAL COURSE:   1. Acute asthmatic bronchitis with leukocytosis. Patient feeling better than admission. Still coughing with deep breath. She is feeling well enough to go home. I'll give 40 mg of prednisone for 5 more days. Give albuterol nebulizer and albuterol inhaler. Flovent inhaler. Follow-up as outpatient. Finish up the Z-Pak. 2. Obstructive sleep apnea 3. History of CHF- no signs of congestive heart failure at this point 4. Restless leg syndrome- on Requip  DISCHARGE CONDITIONS:   Satisfactory  CONSULTS OBTAINED:  Treatment Team:  Allyne Gee, MD  DRUG ALLERGIES:   Allergies  Allergen Reactions  . Contrast Media [Iodinated Diagnostic Agents] Shortness Of Breath  . Latex Hives  . Ondansetron Other (See Comments)    Severe headache  . Penicillins Hives    Has patient had a PCN reaction causing immediate rash, facial/tongue/throat swelling, SOB or lightheadedness with hypotension: Yes Has patient had a PCN reaction causing severe rash involving mucus membranes or skin necrosis: No Has patient had a PCN reaction that required hospitalization No Has patient had a PCN reaction occurring within the last 10 years: No If all of the above answers are "NO", then  may proceed with Cephalosporin use.  . Povidone-Iodine Rash    DISCHARGE MEDICATIONS:   Current Discharge Medication List    START taking these medications   Details  albuterol (PROVENTIL HFA;VENTOLIN HFA) 108 (90 Base) MCG/ACT inhaler Inhale 2 puffs into the lungs every 6 (six) hours as needed for wheezing or shortness of breath. Qty: 1 Inhaler, Refills: 0    albuterol (PROVENTIL) (2.5 MG/3ML) 0.083% nebulizer solution Take 3 mLs (2.5 mg total) by nebulization every 6 (six) hours. Qty: 75 mL, Refills: 1    azithromycin (ZITHROMAX) 250 MG tablet One tab daily for three days Qty: 3 each, Refills: 0    chlorpheniramine-HYDROcodone (TUSSIONEX) 10-8 MG/5ML SUER Take 5 mLs by mouth every 12 (twelve) hours as needed for cough. Qty: 50 mL, Refills: 0    fluticasone (FLOVENT HFA) 220 MCG/ACT inhaler Inhale 1 puff into the lungs 2 (two) times daily. Qty: 1 Inhaler, Refills: 12    predniSONE (DELTASONE) 20 MG tablet 2 tabs daily for 5 days Qty: 10 tablet, Refills: 0      CONTINUE these medications which have NOT CHANGED   Details  rOPINIRole (REQUIP) 2 MG tablet Take 1 tablet (2 mg total) by mouth at bedtime. Qty: 30 tablet, Refills: 0      STOP taking these medications     dicyclomine (BENTYL) 20 MG tablet      promethazine (PHENERGAN) 25 MG tablet          DISCHARGE INSTRUCTIONS:   Follow-up PMD one week  If  you experience worsening of your admission symptoms, develop shortness of breath, life threatening emergency, suicidal or homicidal thoughts you must seek medical attention immediately by calling 911 or calling your MD immediately  if symptoms less severe.  You Must read complete instructions/literature along with all the possible adverse reactions/side effects for all the Medicines you take and that have been prescribed to you. Take any new Medicines after you have completely understood and accept all the possible adverse reactions/side effects.   Please note  You  were cared for by a hospitalist during your hospital stay. If you have any questions about your discharge medications or the care you received while you were in the hospital after you are discharged, you can call the unit and asked to speak with the hospitalist on call if the hospitalist that took care of you is not available. Once you are discharged, your primary care physician will handle any further medical issues. Please note that NO REFILLS for any discharge medications will be authorized once you are discharged, as it is imperative that you return to your primary care physician (or establish a relationship with a primary care physician if you do not have one) for your aftercare needs so that they can reassess your need for medications and monitor your lab values.    Today   CHIEF COMPLAINT:   Chief Complaint  Patient presents with  . Shortness of Breath    HISTORY OF PRESENT ILLNESS:  Brittany Nguyen  is a 50 y.o. female presented with shortness of breath.   VITAL SIGNS:  Blood pressure 118/72, pulse 95, temperature 97.9 F (36.6 C), temperature source Oral, resp. rate 19, height 5\' 5"  (1.651 m), weight 102.377 kg (225 lb 11.2 oz), SpO2 95 %.    PHYSICAL EXAMINATION:  GENERAL:  50 y.o.-year-old patient lying in the bed with no acute distress.  EYES: Pupils equal, round, reactive to light and accommodation. No scleral icterus. Extraocular muscles intact.  HEENT: Head atraumatic, normocephalic. Oropharynx and nasopharynx clear.  NECK:  Supple, no jugular venous distention. No thyroid enlargement, no tenderness.  LUNGS: Decreased breath sounds bilaterally cough with deep breath.  no wheezing, rales,rhonchi or crepitation. No use of accessory muscles of respiration.  CARDIOVASCULAR: S1, S2 normal. No murmurs, rubs, or gallops.  ABDOMEN: Soft, non-tender, non-distended. Bowel sounds present. No organomegaly or mass.  EXTREMITIES: Trace edema, no cyanosis, or clubbing.  NEUROLOGIC:  Cranial nerves II through XII are intact. Muscle strength 5/5 in all extremities. Sensation intact. Gait not checked.  PSYCHIATRIC: The patient is alert and oriented x 3.  SKIN: No obvious rash, lesion, or ulcer.   DATA REVIEW:   CBC  Recent Labs Lab 09/19/15 0036  WBC 15.4*  HGB 12.1  HCT 37.0  PLT 303    Chemistries   Recent Labs Lab 09/19/15 0036  NA 140  K 3.2*  CL 105  CO2 27  GLUCOSE 107*  BUN 15  CREATININE 1.00  CALCIUM 9.2    Cardiac Enzymes  Recent Labs Lab 09/19/15 1820  TROPONINI <0.03    Microbiology Results  Results for orders placed or performed during the hospital encounter of 09/19/15  Culture, blood (routine x 2)     Status: None (Preliminary result)   Collection Time: 09/19/15 12:36 AM  Result Value Ref Range Status   Specimen Description BLOOD RIGHT ASSIST CONTROL  Final   Special Requests BOTTLES DRAWN AEROBIC AND ANAEROBIC [place]  Final   Culture NO GROWTH 1 DAY  Final  Report Status PENDING  Incomplete  Culture, blood (routine x 2)     Status: None (Preliminary result)   Collection Time: 09/19/15  2:48 AM  Result Value Ref Range Status   Specimen Description BLOOD ARM  Final   Special Requests BOTTLES DRAWN AEROBIC AND ANAEROBIC 10CC  Final   Culture NO GROWTH 1 DAY  Final   Report Status PENDING  Incomplete    Management plans discussed with the patient, family and they are in agreement.  CODE STATUS:     Code Status Orders        Start     Ordered   09/19/15 0512  Full code   Continuous     09/19/15 0511    Code Status History    Date Active Date Inactive Code Status Order ID Comments User Context   This patient has a current code status but no historical code status.      TOTAL TIME TAKING CARE OF THIS PATIENT: 35 minutes.    Loletha Grayer M.D on 09/21/2015 at 2:27 PM  Between 7am to 6pm - Pager - [phone]  After 6pm go to [email or web address] - password [email or web address] [facility]  [place] [facility]   Office  [phone]  CC: Primary care physician; [name], MD

## 2015-09-21 NOTE — Progress Notes (Signed)
Patient ID: Brittany Nguyen, female   DOB: [birth year], 50 y.o.   MRN: [id number] [place] [facility] [place] was admitted to the Hospital on 09/19/2015 and Discharged  09/21/2015 and should be excused from work/school   for 8 days starting 09/19/2015 , may return to work/school without any restrictions.  Loletha Grayer M.D on 09/21/2015,at 10:04 AM  [place] [facility] [place]

## 2015-09-21 NOTE — Progress Notes (Signed)
Pt for discharge home. Alert.  Sl d/cd. Discharge instructions discussed with pt  meds / diet / activity and f/u discussed.  verblaize understanding.  Home at this time via w/c w/o c/o.

## 2015-09-21 NOTE — Plan of Care (Signed)
Problem: Safety: Goal: Ability to remain free from injury will improve Outcome: Progressing Pt is up ad lib.  Calls with needs.  Problem: Pain Managment: Goal: General experience of comfort will improve Outcome: Progressing Pt has chronic migraines.  Headache this evening.  Used cold washrag.

## 2015-09-24 LAB — CULTURE, BLOOD (ROUTINE X 2)
CULTURE: NO GROWTH
Culture: NO GROWTH

## 2015-11-18 IMAGING — CR DG CHEST 1V PORT
1 series · 1 of 1 positions shown · non-contrast
Comparison: 04/15/2014 and prior radiographs

CLINICAL DATA: Acute chest pain.  Initial encounter.

EXAM:
PORTABLE CHEST - 1 VIEW

[ap]
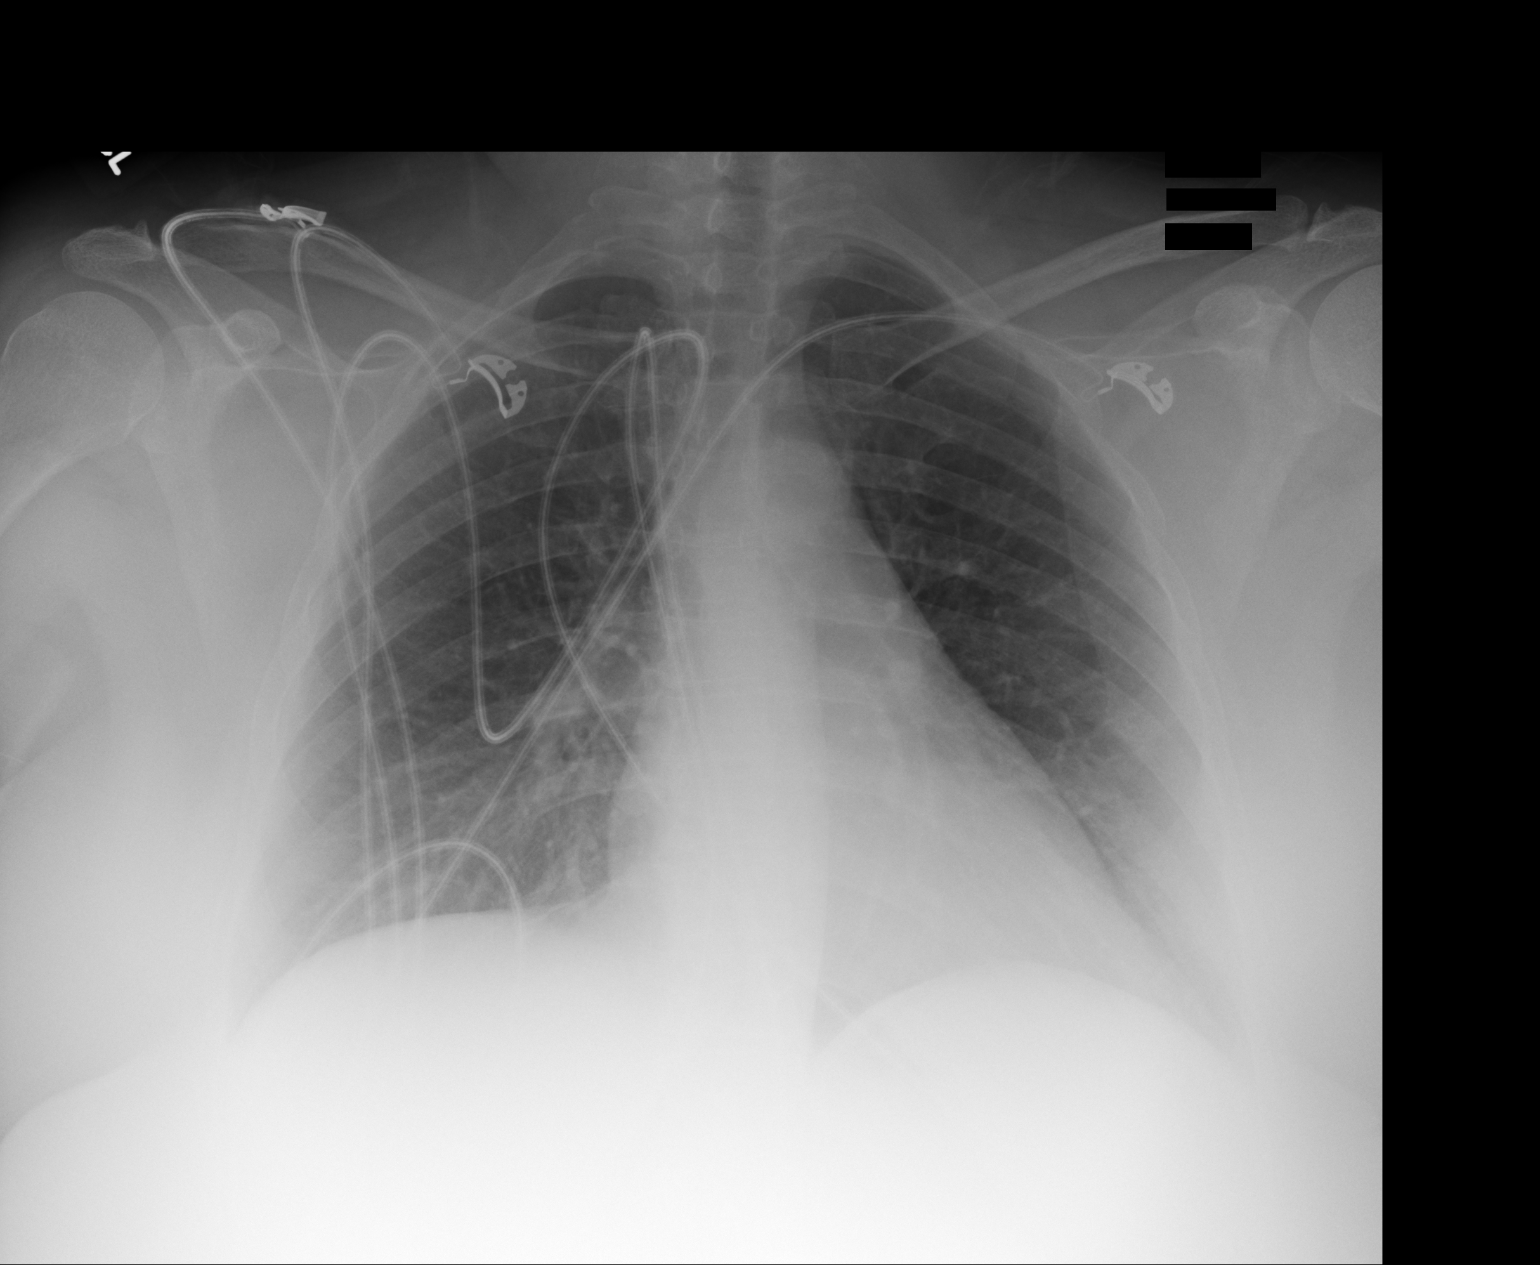

[1 of 1 positions shown; findings below may reference images not displayed]

FINDINGS: The cardiomediastinal silhouette is unremarkable.

Mild elevation of the right hemidiaphragm is again noted.

There is no evidence of focal airspace disease, pulmonary edema,
suspicious pulmonary nodule/mass, pleural effusion, or pneumothorax.
No acute bony abnormalities are identified.
IMPRESSION: No active disease.

## 2015-11-21 ENCOUNTER — Inpatient Hospital Stay
Admit: 2015-11-21 | Discharge: 2015-11-21 | Disposition: A | Discharge status: Home / Self Care | Payer: Self-pay | Attending: Internal Medicine | Admitting: Internal Medicine

## 2015-11-21 ENCOUNTER — Inpatient Hospital Stay: Payer: Self-pay

## 2015-11-21 ENCOUNTER — Observation Stay
Admission: EM | Admit: 2015-11-21 | Discharge: 2015-11-22 | Disposition: A | Discharge status: Home / Self Care | Payer: Self-pay | Attending: Internal Medicine | Admitting: Internal Medicine

## 2015-11-21 ENCOUNTER — Encounter: Payer: Self-pay | Admitting: Internal Medicine

## 2015-11-21 ENCOUNTER — Emergency Department: Payer: Self-pay

## 2015-11-21 DIAGNOSIS — I509 Heart failure, unspecified: Secondary | ICD-10-CM | POA: Insufficient documentation

## 2015-11-21 DIAGNOSIS — Z7952 Long term (current) use of systemic steroids: Secondary | ICD-10-CM | POA: Insufficient documentation

## 2015-11-21 DIAGNOSIS — D72829 Elevated white blood cell count, unspecified: Secondary | ICD-10-CM

## 2015-11-21 DIAGNOSIS — Z880 Allergy status to penicillin: Secondary | ICD-10-CM | POA: Insufficient documentation

## 2015-11-21 DIAGNOSIS — Z91048 Other nonmedicinal substance allergy status: Secondary | ICD-10-CM | POA: Insufficient documentation

## 2015-11-21 DIAGNOSIS — I519 Heart disease, unspecified: Secondary | ICD-10-CM | POA: Insufficient documentation

## 2015-11-21 DIAGNOSIS — E876 Hypokalemia: Secondary | ICD-10-CM

## 2015-11-21 DIAGNOSIS — J449 Chronic obstructive pulmonary disease, unspecified: Principal | ICD-10-CM | POA: Insufficient documentation

## 2015-11-21 DIAGNOSIS — Z8614 Personal history of Methicillin resistant Staphylococcus aureus infection: Secondary | ICD-10-CM | POA: Insufficient documentation

## 2015-11-21 DIAGNOSIS — Z91040 Latex allergy status: Secondary | ICD-10-CM | POA: Insufficient documentation

## 2015-11-21 DIAGNOSIS — J209 Acute bronchitis, unspecified: Secondary | ICD-10-CM

## 2015-11-21 DIAGNOSIS — Z808 Family history of malignant neoplasm of other organs or systems: Secondary | ICD-10-CM | POA: Insufficient documentation

## 2015-11-21 DIAGNOSIS — Z9049 Acquired absence of other specified parts of digestive tract: Secondary | ICD-10-CM | POA: Insufficient documentation

## 2015-11-21 DIAGNOSIS — Z79899 Other long term (current) drug therapy: Secondary | ICD-10-CM | POA: Insufficient documentation

## 2015-11-21 DIAGNOSIS — Z7951 Long term (current) use of inhaled steroids: Secondary | ICD-10-CM | POA: Insufficient documentation

## 2015-11-21 DIAGNOSIS — Z803 Family history of malignant neoplasm of breast: Secondary | ICD-10-CM | POA: Insufficient documentation

## 2015-11-21 DIAGNOSIS — Z8041 Family history of malignant neoplasm of ovary: Secondary | ICD-10-CM | POA: Insufficient documentation

## 2015-11-21 DIAGNOSIS — Z8719 Personal history of other diseases of the digestive system: Secondary | ICD-10-CM | POA: Insufficient documentation

## 2015-11-21 DIAGNOSIS — Z86718 Personal history of other venous thrombosis and embolism: Secondary | ICD-10-CM | POA: Insufficient documentation

## 2015-11-21 DIAGNOSIS — R053 Chronic cough: Secondary | ICD-10-CM

## 2015-11-21 DIAGNOSIS — Z888 Allergy status to other drugs, medicaments and biological substances status: Secondary | ICD-10-CM | POA: Insufficient documentation

## 2015-11-21 DIAGNOSIS — Z91041 Radiographic dye allergy status: Secondary | ICD-10-CM | POA: Insufficient documentation

## 2015-11-21 DIAGNOSIS — R000 Tachycardia, unspecified: Secondary | ICD-10-CM | POA: Insufficient documentation

## 2015-11-21 DIAGNOSIS — J45909 Unspecified asthma, uncomplicated: Secondary | ICD-10-CM | POA: Insufficient documentation

## 2015-11-21 DIAGNOSIS — R05 Cough: Secondary | ICD-10-CM | POA: Insufficient documentation

## 2015-11-21 DIAGNOSIS — R0600 Dyspnea, unspecified: Secondary | ICD-10-CM | POA: Diagnosis present

## 2015-11-21 DIAGNOSIS — Z90710 Acquired absence of both cervix and uterus: Secondary | ICD-10-CM | POA: Insufficient documentation

## 2015-11-21 DIAGNOSIS — G2581 Restless legs syndrome: Secondary | ICD-10-CM | POA: Insufficient documentation

## 2015-11-21 DIAGNOSIS — Z9889 Other specified postprocedural states: Secondary | ICD-10-CM | POA: Insufficient documentation

## 2015-11-21 LAB — DIFFERENTIAL
BASOS PCT: 1 %
Basophils Absolute: 0.1 10*3/uL (ref 0–0.1)
EOS ABS: 0.3 10*3/uL (ref 0–0.7)
EOS PCT: 2 %
Lymphocytes Relative: 21 %
Lymphs Abs: 3.2 10*3/uL (ref 1.0–3.6)
MONO ABS: 1.1 10*3/uL — AB (ref 0.2–0.9)
MONOS PCT: 7 %
Neutro Abs: 10.5 10*3/uL — ABNORMAL HIGH (ref 1.4–6.5)
Neutrophils Relative %: 69 %

## 2015-11-21 LAB — TROPONIN I: Troponin I: <0.03 ng/mL (ref <0.031)

## 2015-11-21 LAB — CBC
HCT: 36.5 % (ref 35.0–47.0)
HEMATOCRIT: 36.4 % (ref 35.0–47.0)
HEMOGLOBIN: 12.2 g/dL (ref 12.0–16.0)
HEMOGLOBIN: 12.2 g/dL (ref 12.0–16.0)
MCH: 26.2 pg (ref 26.0–34.0)
MCH: 26.4 pg (ref 26.0–34.0)
MCHC: 33.4 g/dL (ref 32.0–36.0)
MCHC: 33.4 g/dL (ref 32.0–36.0)
MCV: 78.6 fL — AB (ref 80.0–100.0)
MCV: 79 fL — ABNORMAL LOW (ref 80.0–100.0)
Platelets: 318 10*3/uL (ref 150–440)
Platelets: 279 10*3/uL (ref 150–440)
RBC: 4.64 MIL/uL (ref 3.80–5.20)
RBC: 4.61 MIL/uL (ref 3.80–5.20)
RDW: 15.6 % — ABNORMAL HIGH (ref 11.5–14.5)
RDW: 15.5 % — ABNORMAL HIGH (ref 11.5–14.5)
WBC: 13.9 10*3/uL — ABNORMAL HIGH (ref 3.6–11.0)
WBC: 8.2 10*3/uL (ref 3.6–11.0)

## 2015-11-21 LAB — COMPREHENSIVE METABOLIC PANEL
ALK PHOS: 97 U/L (ref 38–126)
ALT: 22 U/L (ref 14–54)
ANION GAP: 8 (ref 5–15)
AST: 23 U/L (ref 15–41)
Albumin: 3.9 g/dL (ref 3.5–5.0)
BUN: 13 mg/dL (ref 6–20)
CALCIUM: 8.9 mg/dL (ref 8.9–10.3)
CO2: 23 mmol/L (ref 22–32)
CREATININE: 0.92 mg/dL (ref 0.44–1.00)
Chloride: 107 mmol/L (ref 101–111)
Glucose, Bld: 110 mg/dL — ABNORMAL HIGH (ref 65–99)
Potassium: 3.4 mmol/L — ABNORMAL LOW (ref 3.5–5.1)
Sodium: 138 mmol/L (ref 135–145)
TOTAL PROTEIN: 7.4 g/dL (ref 6.5–8.1)
Total Bilirubin: 0.5 mg/dL (ref 0.3–1.2)

## 2015-11-21 LAB — BASIC METABOLIC PANEL
ANION GAP: 8 (ref 5–15)
BUN: 13 mg/dL (ref 6–20)
CALCIUM: 9 mg/dL (ref 8.9–10.3)
CHLORIDE: 108 mmol/L (ref 101–111)
CO2: 23 mmol/L (ref 22–32)
Creatinine, Ser: 0.94 mg/dL (ref 0.44–1.00)
GFR calc non Af Amer: >60 mL/min (ref >60)
Glucose, Bld: 185 mg/dL — ABNORMAL HIGH (ref 65–99)
Potassium: 3.6 mmol/L (ref 3.5–5.1)
Sodium: 139 mmol/L (ref 135–145)

## 2015-11-21 LAB — PROTIME-INR
INR: 1.09
Prothrombin Time: 14.3 seconds (ref 11.4–15.0)

## 2015-11-21 LAB — FIBRIN DERIVATIVES D-DIMER (ARMC ONLY): Fibrin derivatives D-dimer (ARMC): 579 — ABNORMAL HIGH (ref 0–499)

## 2015-11-21 LAB — BRAIN NATRIURETIC PEPTIDE: B NATRIURETIC PEPTIDE 5: 26 pg/mL (ref 0.0–100.0)

## 2015-11-21 MED ORDER — ONDANSETRON HCL 4 MG PO TABS: 4.0000 mg | ORAL_TABLET | Freq: Four times a day (QID) | ORAL | Status: DC | PRN

## 2015-11-21 MED ORDER — PROMETHAZINE HCL 25 MG/ML IJ SOLN
12.5000 mg | Freq: Once | INTRAMUSCULAR | Status: AC
  Administered 2015-11-21 (×2): 12.5 mg via INTRAVENOUS
  Filled 2015-11-21: qty 1

## 2015-11-21 MED ORDER — METHYLPREDNISOLONE SODIUM SUCC 125 MG IJ SOLR
125.0000 mg | Freq: Once | INTRAMUSCULAR | Status: AC
  Administered 2015-11-21: 125 mg via INTRAVENOUS

## 2015-11-21 MED ORDER — ACETAMINOPHEN 650 MG RE SUPP: 650.0000 mg | Freq: Four times a day (QID) | RECTAL | Status: DC | PRN

## 2015-11-21 MED ORDER — MORPHINE SULFATE (PF) 2 MG/ML IV SOLN
2.0000 mg | INTRAVENOUS | Status: DC | PRN
  Administered 2015-11-21 – 2015-11-22 (×3): 2 mg via INTRAVENOUS
  Filled 2015-11-21 (×3): qty 1

## 2015-11-21 MED ORDER — IPRATROPIUM-ALBUTEROL 0.5-2.5 (3) MG/3ML IN SOLN
RESPIRATORY_TRACT | Status: AC
  Administered 2015-11-21: 3 mL via RESPIRATORY_TRACT
  Filled 2015-11-21: qty 3

## 2015-11-21 MED ORDER — POTASSIUM CHLORIDE CRYS ER 20 MEQ PO TBCR
20.0000 meq | EXTENDED_RELEASE_TABLET | Freq: Once | ORAL | Status: AC
  Administered 2015-11-21: 20 meq via ORAL
  Filled 2015-11-21: qty 1

## 2015-11-21 MED ORDER — SODIUM CHLORIDE 0.9% FLUSH: 3.0000 mL | INTRAVENOUS | Status: DC | PRN

## 2015-11-21 MED ORDER — ENOXAPARIN SODIUM 120 MG/0.8ML ~~LOC~~ SOLN
105.0000 mg | Freq: Two times a day (BID) | SUBCUTANEOUS | Status: DC
  Administered 2015-11-21 – 2015-11-22 (×2): 105 mg via SUBCUTANEOUS
  Filled 2015-11-21 (×4): qty 0.8

## 2015-11-21 MED ORDER — TECHNETIUM TO 99M ALBUMIN AGGREGATED
3.8970 | Freq: Once | INTRAVENOUS | Status: AC | PRN
  Administered 2015-11-21: 3.897 via INTRAVENOUS

## 2015-11-21 MED ORDER — HYDROCOD POLST-CPM POLST ER 10-8 MG/5ML PO SUER: 5.0000 mL | Freq: Two times a day (BID) | ORAL | Status: DC

## 2015-11-21 MED ORDER — METHYLPREDNISOLONE SODIUM SUCC 125 MG IJ SOLR
INTRAMUSCULAR | Status: AC
  Administered 2015-11-21: 125 mg via INTRAVENOUS
  Filled 2015-11-21: qty 2

## 2015-11-21 MED ORDER — IPRATROPIUM-ALBUTEROL 0.5-2.5 (3) MG/3ML IN SOLN
3.0000 mL | Freq: Once | RESPIRATORY_TRACT | Status: AC
  Administered 2015-11-21: 3 mL via RESPIRATORY_TRACT
  Filled 2015-11-21: qty 3

## 2015-11-21 MED ORDER — ALBUTEROL SULFATE (2.5 MG/3ML) 0.083% IN NEBU
2.5000 mg | INHALATION_SOLUTION | Freq: Four times a day (QID) | RESPIRATORY_TRACT | Status: DC
  Administered 2015-11-21 – 2015-11-22 (×4): 2.5 mg via RESPIRATORY_TRACT
  Filled 2015-11-21 (×4): qty 3

## 2015-11-21 MED ORDER — ACETAMINOPHEN 325 MG PO TABS
650.0000 mg | ORAL_TABLET | Freq: Four times a day (QID) | ORAL | Status: DC | PRN
  Administered 2015-11-21: 650 mg via ORAL
  Filled 2015-11-21: qty 2

## 2015-11-21 MED ORDER — MORPHINE SULFATE (PF) 4 MG/ML IV SOLN
4.0000 mg | Freq: Once | INTRAVENOUS | Status: AC
  Administered 2015-11-21: 4 mg via INTRAVENOUS
  Filled 2015-11-21: qty 1

## 2015-11-21 MED ORDER — HYDROCOD POLST-CPM POLST ER 10-8 MG/5ML PO SUER
5.0000 mL | Freq: Two times a day (BID) | ORAL | Status: DC | PRN
  Administered 2015-11-21: 5 mL via ORAL
  Filled 2015-11-21: qty 5

## 2015-11-21 MED ORDER — SODIUM CHLORIDE 0.9% FLUSH
3.0000 mL | Freq: Two times a day (BID) | INTRAVENOUS | Status: DC
  Administered 2015-11-21 – 2015-11-22 (×2): 3 mL via INTRAVENOUS

## 2015-11-21 MED ORDER — INFLUENZA VAC SPLIT QUAD 0.5 ML IM SUSY: 0.5000 mL | PREFILLED_SYRINGE | INTRAMUSCULAR | Status: DC

## 2015-11-21 MED ORDER — DIPHENHYDRAMINE HCL 50 MG/ML IJ SOLN
25.0000 mg | Freq: Once | INTRAMUSCULAR | Status: AC
  Administered 2015-11-21: 25 mg via INTRAVENOUS
  Filled 2015-11-21: qty 1

## 2015-11-21 MED ORDER — ENOXAPARIN SODIUM 100 MG/ML ~~LOC~~ SOLN
1.0000 mg/kg | Freq: Two times a day (BID) | SUBCUTANEOUS | Status: DC
  Administered 2015-11-21: 90 mg via SUBCUTANEOUS
  Filled 2015-11-21: qty 1

## 2015-11-21 MED ORDER — SODIUM CHLORIDE 0.9 % IV SOLN: 250.0000 mL | INTRAVENOUS | Status: DC | PRN

## 2015-11-21 MED ORDER — PROMETHAZINE HCL 25 MG/ML IJ SOLN
INTRAMUSCULAR | Status: AC
  Administered 2015-11-21: 12.5 mg via INTRAVENOUS
  Filled 2015-11-21: qty 1

## 2015-11-21 MED ORDER — HYDROCOD POLST-CPM POLST ER 10-8 MG/5ML PO SUER
5.0000 mL | Freq: Two times a day (BID) | ORAL | Status: DC
  Administered 2015-11-21 – 2015-11-22 (×2): 5 mL via ORAL
  Filled 2015-11-21 (×2): qty 5

## 2015-11-21 MED ORDER — SODIUM CHLORIDE 0.9% FLUSH
3.0000 mL | Freq: Two times a day (BID) | INTRAVENOUS | Status: DC
  Administered 2015-11-21 – 2015-11-22 (×3): 3 mL via INTRAVENOUS

## 2015-11-21 MED ORDER — ONDANSETRON HCL 4 MG/2ML IJ SOLN: 4.0000 mg | Freq: Four times a day (QID) | INTRAMUSCULAR | Status: DC | PRN

## 2015-11-21 MED ORDER — ALBUTEROL SULFATE HFA 108 (90 BASE) MCG/ACT IN AERS: 2.0000 | INHALATION_SPRAY | Freq: Four times a day (QID) | RESPIRATORY_TRACT | Status: DC | PRN

## 2015-11-21 MED ORDER — ROPINIROLE HCL 1 MG PO TABS
2.0000 mg | ORAL_TABLET | Freq: Every day | ORAL | Status: DC
  Administered 2015-11-21: 2 mg via ORAL
  Filled 2015-11-21: qty 2

## 2015-11-21 MED ORDER — GUAIFENESIN ER 600 MG PO TB12
600.0000 mg | ORAL_TABLET | Freq: Two times a day (BID) | ORAL | Status: DC
  Administered 2015-11-21 – 2015-11-22 (×3): 600 mg via ORAL
  Filled 2015-11-21 (×3): qty 1

## 2015-11-21 MED ORDER — SENNOSIDES-DOCUSATE SODIUM 8.6-50 MG PO TABS: 1.0000 | ORAL_TABLET | Freq: Every evening | ORAL | Status: DC | PRN

## 2015-11-21 MED ORDER — TECHNETIUM TC 99M DIETHYLENETRIAME-PENTAACETIC ACID
31.8870 | Freq: Once | INTRAVENOUS | Status: AC | PRN
  Administered 2015-11-21: 31.887 via INTRAVENOUS

## 2015-11-21 MED ORDER — CODEINE SULFATE 30 MG PO TABS
30.0000 mg | ORAL_TABLET | Freq: Four times a day (QID) | ORAL | Status: DC | PRN
  Administered 2015-11-21: 30 mg via ORAL
  Filled 2015-11-21: qty 1

## 2015-11-21 MED ORDER — PROMETHAZINE HCL 25 MG/ML IJ SOLN
12.5000 mg | INTRAMUSCULAR | Status: DC | PRN
  Administered 2015-11-21 – 2015-11-22 (×2): 12.5 mg via INTRAVENOUS
  Filled 2015-11-21 (×2): qty 1

## 2015-11-21 MED ORDER — PNEUMOCOCCAL VAC POLYVALENT 25 MCG/0.5ML IJ INJ: 0.5000 mL | INJECTION | INTRAMUSCULAR | Status: DC

## 2015-11-21 MED ORDER — IPRATROPIUM-ALBUTEROL 0.5-2.5 (3) MG/3ML IN SOLN
3.0000 mL | Freq: Once | RESPIRATORY_TRACT | Status: AC
  Administered 2015-11-21: 3 mL via RESPIRATORY_TRACT

## 2015-11-21 NOTE — Progress Notes (Signed)
ANTICOAGULATION CONSULT NOTE - Initial Consult  Pharmacy Consult for Lovenox  Indication: pulmonary embolus  Allergies  Allergen Reactions  . Contrast Media [Iodinated Diagnostic Agents] Shortness Of Breath  . Latex Hives  . Ondansetron Other (See Comments)    Severe headache  . Penicillins Hives    Has patient had a PCN reaction causing immediate rash, facial/tongue/throat swelling, SOB or lightheadedness with hypotension: Yes Has patient had a PCN reaction causing severe rash involving mucus membranes or skin necrosis: No Has patient had a PCN reaction that required hospitalization No Has patient had a PCN reaction occurring within the last 10 years: No If all of the above answers are "NO", then may proceed with Cephalosporin use.  . Povidone-Iodine Rash    Patient Measurements: Height: 5\' 5"  (165.1 cm) Weight: 200 lb (90.719 kg) IBW/kg (Calculated) : 57 Heparin Dosing Weight: 90.7 kg   Vital Signs: Temp: 98.1 F (36.7 C) (03/18 0535) Temp Source: Oral (03/18 0535) BP: 113/47 mmHg (03/18 0535) Pulse Rate: 112 (03/18 0535)  Labs:  Recent Labs  11/21/15 0102 11/21/15 0359  HGB 12.2  --   HCT 36.5  --   PLT 318  --   CREATININE 0.92  --   TROPONINI <0.03 <0.03    Estimated Creatinine Clearance: 84.1 mL/min (by C-G formula based on Cr of 0.92).   Medical History: Past Medical History  Diagnosis Date  . Hx MRSA infection   . Asthma 2013  . Heart disease 2013  . Diverticulitis 2010  . Lump or mass in breast   . Endometriosis 1990  . Restless leg   . CHF (congestive heart failure) ([place])   . DVT (deep venous thrombosis) ([facility])     Medications:   (Not in a hospital admission)  Assessment: Pharmacy consulted to dose lovenox in this 50 year old female admitted with PE.  CrCl = 84.1 ml/min , TBW = 90.7 kg   Goal of Therapy:   Monitor platelets by anticoagulation protocol: Yes   Plan:  Lovenox 90 mg IV Q12H ordered to start 3/18 @ 6:00.   Jase Himmelberger  D 11/21/2015,5:45 AM

## 2015-11-21 NOTE — H&P (Signed)
[facility] NAME: Brittany Nguyen    MR#:  [id number]  DATE OF BIRTH:  1966/07/06  DATE OF ADMISSION:  11/21/2015  PRIMARY CARE PHYSICIAN: [name], MD   REQUESTING/REFERRING PHYSICIAN:   CHIEF COMPLAINT:   Chief Complaint  Patient presents with  . Shortness of Breath    HISTORY OF PRESENT ILLNESS: Brittany Nguyen  is a 50 y.o. female with a known history of bronchial asthma, breast cancer, restless leg syndrome, congestive heart failure, right leg DVT presented to the emergency room with shortness of breath and cough. The shortness of breath has been going on since yesterday and cough has been going on for the last 2 days. She finished a course of oral Zithromax antibiotic. Patient had history of DVT couple of years ago and currently she is off Coumadin. No history of any orthopnea. No history of any chest pain. Patient was put on oxygen by nasal cannula in the emergency room and the workup in the ER showed elevated d-dimer. CT angiogram of the chest could not be done in view of elevated d-dimer. Pending VQ scan, patient started on antecolic patient with full dose Lovenox. No history of recent travel or sick contacts at home.  PAST MEDICAL HISTORY:   Past Medical History  Diagnosis Date  . Hx MRSA infection   . Asthma 2013  . Heart disease 2013  . Diverticulitis 2010  . Lump or mass in breast   . Endometriosis 1990  . Restless leg   . CHF (congestive heart failure) ([place])   . DVT (deep venous thrombosis) ([place])     PAST SURGICAL HISTORY: Past Surgical History  Procedure Laterality Date  . Abdominal hysterectomy      age 60  . Nasal sinus surgery  2012  . Colectomy    . Cholecystectomy    . Cesarean section      SOCIAL HISTORY:  Social History  Substance Use Topics  . Smoking status: Never Smoker   . Smokeless tobacco: Never Used  . Alcohol Use: No    FAMILY HISTORY:  Family History  Problem Relation Age of  Onset  . Cancer Mother 30    ovarian  . Cancer Father 8    brain  . Cancer Daughter 18    skin  . Cancer Maternal Aunt 34    breast    DRUG ALLERGIES:  Allergies  Allergen Reactions  . Contrast Media [Iodinated Diagnostic Agents] Shortness Of Breath  . Latex Hives  . Ondansetron Other (See Comments)    Severe headache  . Penicillins Hives    Has patient had a PCN reaction causing immediate rash, facial/tongue/throat swelling, SOB or lightheadedness with hypotension: Yes Has patient had a PCN reaction causing severe rash involving mucus membranes or skin necrosis: No Has patient had a PCN reaction that required hospitalization No Has patient had a PCN reaction occurring within the last 10 years: No If all of the above answers are "NO", then may proceed with Cephalosporin use.  . Povidone-Iodine Rash    REVIEW OF SYSTEMS:   CONSTITUTIONAL: No fever, fatigue or weakness.  EYES: No blurred or double vision.  EARS, NOSE, AND THROAT: No tinnitus or ear pain.  RESPIRATORY: Has cough,Has shortness of breath,no wheezing or hemoptysis.  CARDIOVASCULAR: No chest pain, orthopnea, edema.  GASTROINTESTINAL: No nausea, vomiting, diarrhea or abdominal pain.  GENITOURINARY: No dysuria, hematuria.  ENDOCRINE: No polyuria, nocturia,  HEMATOLOGY: No anemia, easy bruising or  bleeding SKIN: No rash or lesion. MUSCULOSKELETAL: No joint pain or arthritis.   NEUROLOGIC: No tingling, numbness, weakness.  PSYCHIATRY: No anxiety or depression.   MEDICATIONS AT HOME:  Prior to Admission medications   Medication Sig Start Date End Date Taking? Authorizing Provider  albuterol (PROVENTIL HFA;VENTOLIN HFA) 108 (90 Base) MCG/ACT inhaler Inhale 2 puffs into the lungs every 6 (six) hours as needed for wheezing or shortness of breath. 09/21/15  Yes Richard Leslye Peer, MD  albuterol (PROVENTIL) (2.5 MG/3ML) 0.083% nebulizer solution Take 3 mLs (2.5 mg total) by nebulization every 6 (six) hours. 09/21/15  Yes  Richard Leslye Peer, MD  fluticasone (FLOVENT HFA) 220 MCG/ACT inhaler Inhale 1 puff into the lungs 2 (two) times daily. 09/21/15  Yes Richard Leslye Peer, MD  rOPINIRole (REQUIP) 2 MG tablet Take 1 tablet (2 mg total) by mouth at bedtime. Patient taking differently: Take 4 mg by mouth at bedtime.  05/14/15  Yes Johnn Hai, PA-C  azithromycin (ZITHROMAX) 250 MG tablet One tab daily for three days Patient not taking: Reported on 11/21/2015 09/21/15   Loletha Grayer, MD  chlorpheniramine-HYDROcodone (TUSSIONEX) 10-8 MG/5ML SUER Take 5 mLs by mouth every 12 (twelve) hours as needed for cough. Patient not taking: Reported on 11/21/2015 09/21/15   Loletha Grayer, MD  predniSONE (DELTASONE) 20 MG tablet 2 tabs daily for 5 days Patient not taking: Reported on 11/21/2015 09/21/15   Loletha Grayer, MD      PHYSICAL EXAMINATION:   VITAL SIGNS: Blood pressure 125/85, resp. rate 35, SpO2 99 %.  GENERAL:  50 y.o.-year-old patient lying in the bed with no acute distress.  EYES: Pupils equal, round, reactive to light and accommodation. No scleral icterus. Extraocular muscles intact.  HEENT: Head atraumatic, normocephalic. Oropharynx and nasopharynx clear.  NECK:  Supple, no jugular venous distention. No thyroid enlargement, no tenderness.  LUNGS: Normal breath sounds bilaterally, no wheezing, rales,scattered rhonchi bilaterally. No use of accessory muscles of respiration.  CARDIOVASCULAR: S1, S2 normal. No murmurs, rubs, or gallops.  ABDOMEN: Soft, nontender, nondistended. Bowel sounds present. No organomegaly or mass.  EXTREMITIES: No pedal edema, cyanosis, or clubbing.  NEUROLOGIC: Cranial nerves II through XII are intact. Muscle strength 5/5 in all extremities. Sensation intact. Gait normal. PSYCHIATRIC: The patient is alert and oriented x 3.  SKIN: No obvious rash, lesion, or ulcer.   LABORATORY PANEL:   CBC  Recent Labs Lab 11/21/15 0102  WBC 13.9*  HGB 12.2  HCT 36.5  PLT 318  MCV 78.6*  MCH  26.2  MCHC 33.4  RDW 15.6*  LYMPHSABS 3.2  MONOABS 1.1*  EOSABS 0.3  BASOSABS 0.1   ------------------------------------------------------------------------------------------------------------------  Chemistries   Recent Labs Lab 11/21/15 0102  NA 138  K 3.4*  CL 107  CO2 23  GLUCOSE 110*  BUN 13  CREATININE 0.92  CALCIUM 8.9  AST 23  ALT 22  ALKPHOS 97  BILITOT 0.5   ------------------------------------------------------------------------------------------------------------------ CrCl cannot be calculated (Unknown ideal weight.). ------------------------------------------------------------------------------------------------------------------ No results for input(s): TSH, T4TOTAL, T3FREE, THYROIDAB in the last 72 hours.  Invalid input(s): FREET3   Coagulation profile No results for input(s): INR, PROTIME in the last 168 hours. ------------------------------------------------------------------------------------------------------------------- No results for input(s): DDIMER in the last 72 hours. -------------------------------------------------------------------------------------------------------------------  Cardiac Enzymes  Recent Labs Lab 11/21/15 0102 11/21/15 0359  TROPONINI <0.03 <0.03   ------------------------------------------------------------------------------------------------------------------ Invalid input(s): POCBNP  ---------------------------------------------------------------------------------------------------------------  Urinalysis    Component Value Date/Time   COLORURINE YELLOW* 09/07/2015 0423   COLORURINE Yellow 09/02/2014 2312   APPEARANCEUR HAZY* 09/07/2015  0423   APPEARANCEUR Clear 09/02/2014 2312   LABSPEC 1.004* 09/07/2015 0423   LABSPEC 1.013 09/02/2014 2312   PHURINE 5.0 09/07/2015 0423   PHURINE 6.0 09/02/2014 2312   GLUCOSEU NEGATIVE 09/07/2015 0423   GLUCOSEU Negative 09/02/2014 2312   HGBUR 1+* 09/07/2015 0423    HGBUR Negative 09/02/2014 2312   BILIRUBINUR NEGATIVE 09/07/2015 0423   BILIRUBINUR Negative 09/02/2014 2312   KETONESUR NEGATIVE 09/07/2015 0423   KETONESUR Negative 09/02/2014 2312   PROTEINUR NEGATIVE 09/07/2015 0423   PROTEINUR Negative 09/02/2014 2312   NITRITE NEGATIVE 09/07/2015 0423   NITRITE Negative 09/02/2014 2312   LEUKOCYTESUR 2+* 09/07/2015 0423   LEUKOCYTESUR Negative 09/02/2014 2312     RADIOLOGY: Dg Chest Port 1 View  11/21/2015  CLINICAL DATA:  Acute onset of shortness of breath with exertion and cough. Initial encounter. EXAM: PORTABLE CHEST 1 VIEW COMPARISON:  Chest radiograph performed 09/19/2015 FINDINGS: The lungs are well-aerated and clear. There is no evidence of focal opacification, pleural effusion or pneumothorax. The cardiomediastinal silhouette is within normal limits. No acute osseous abnormalities are seen. IMPRESSION: No acute cardiopulmonary process seen. Electronically Signed   By: Garald Balding M.D.   On: 11/21/2015 02:07    EKG: Orders placed or performed during the hospital encounter of 11/21/15  . EKG 12-Lead  . EKG 12-Lead    IMPRESSION AND PLAN: 50 year old female patient with history of breast cancer, restless leg syndrome, DVT in the past presented to the emergency room with increased shortness of breath and cough. Admitting diagnosis 1. Dyspnea 2. Elevated d-dimer rule out pulmonary embolism 3. Tachycardia 4. Leukocytosis could be secondary to bronchitis or pulmonary embolism 5. History of breast cancer Treatment plan Admit patient to telemetry Anticoagulate patient with full dose Lovenox subcutaneously Check VQ scan to rule out pulmonary embolism Follow-up WBC count Supportive care. All the records are reviewed and case discussed with ED provider. Management plans discussed with the patient, family and they are in agreement.  CODE STATUS:FULL Code Status History    Date Active Date Inactive Code Status Order ID Comments User  Context   09/19/2015  5:11 AM 09/21/2015  6:29 PM Full Code [id number]  Harrie Foreman, MD Inpatient       TOTAL TIME TAKING CARE OF THIS PATIENT: 51 minutes.    Saundra Shelling M.D on 11/21/2015 at 4:43 AM  Between 7am to 6pm - Pager - [phone]  After 6pm go to [email or web address] - password [email or web address] [facility]  [facility]  Office  [phone]  CC: Primary care physician; [name], MD

## 2015-11-21 NOTE — Progress Notes (Signed)
ANTICOAGULATION CONSULT NOTE - Follow up Pharmacy Consult for Lovenox  Indication: pulmonary embolus  Allergies  Allergen Reactions  . Contrast Media [Iodinated Diagnostic Agents] Shortness Of Breath  . Latex Hives  . Ondansetron Other (See Comments)    Severe headache  . Penicillins Hives    Has patient had a PCN reaction causing immediate rash, facial/tongue/throat swelling, SOB or lightheadedness with hypotension: Yes Has patient had a PCN reaction causing severe rash involving mucus membranes or skin necrosis: No Has patient had a PCN reaction that required hospitalization No Has patient had a PCN reaction occurring within the last 10 years: No If all of the above answers are "NO", then may proceed with Cephalosporin use.  . Povidone-Iodine Rash    Patient Measurements: Height: 5\' 5"  (165.1 cm) Weight: 230 lb 1.6 oz (104.373 kg) IBW/kg (Calculated) : 57  Vital Signs: Temp: 98.3 F (36.8 C) (03/18 0653) Temp Source: Oral (03/18 0653) BP: 118/68 mmHg (03/18 0653) Pulse Rate: 116 (03/18 0653)  Labs:  Recent Labs  11/21/15 0102 11/21/15 0359 11/21/15 0700  HGB 12.2  --  12.2  HCT 36.5  --  36.4  PLT 318  --  279  LABPROT  --   --  14.3  INR  --   --  1.09  CREATININE 0.92  --  0.94  TROPONINI <0.03 <0.03  --     Estimated Creatinine Clearance: 88.8 mL/min (by C-G formula based on Cr of 0.94).   Medical History: Past Medical History  Diagnosis Date  . Hx MRSA infection   . Asthma 2013  . Heart disease 2013  . Diverticulitis 2010  . Lump or mass in breast   . Endometriosis 1990  . Restless leg   . CHF (congestive heart failure) ([place])   . DVT (deep venous thrombosis) ([facility])     Medications:  Prescriptions prior to admission  Medication Sig Dispense Refill Last Dose  . albuterol (PROVENTIL HFA;VENTOLIN HFA) 108 (90 Base) MCG/ACT inhaler Inhale 2 puffs into the lungs every 6 (six) hours as needed for wheezing or shortness of breath. 1 Inhaler 0 PRN at PRN   . albuterol (PROVENTIL) (2.5 MG/3ML) 0.083% nebulizer solution Take 3 mLs (2.5 mg total) by nebulization every 6 (six) hours. 75 mL 1 PRN at PRN  . fluticasone (FLOVENT HFA) 220 MCG/ACT inhaler Inhale 1 puff into the lungs 2 (two) times daily. 1 Inhaler 12 11/20/2015 at Unknown time  . rOPINIRole (REQUIP) 2 MG tablet Take 1 tablet (2 mg total) by mouth at bedtime. (Patient taking differently: Take 4 mg by mouth at bedtime. ) 30 tablet 0 11/20/2015 at Unknown time  . azithromycin (ZITHROMAX) 250 MG tablet One tab daily for three days (Patient not taking: Reported on 11/21/2015) 3 each 0 Completed Course at Unknown time  . chlorpheniramine-HYDROcodone (TUSSIONEX) 10-8 MG/5ML SUER Take 5 mLs by mouth every 12 (twelve) hours as needed for cough. (Patient not taking: Reported on 11/21/2015) 50 mL 0 Completed Course at Unknown time  . predniSONE (DELTASONE) 20 MG tablet 2 tabs daily for 5 days (Patient not taking: Reported on 11/21/2015) 10 tablet 0 Completed Course at Unknown time    Assessment: Pharmacy consulted to dose lovenox in this 50 year old female admitted with PE.  Wt: 104.4kg, CrCl: 88.8 ml/min.   Goal of Therapy:   Monitor platelets by anticoagulation protocol: Yes   Plan:  Current order is for Lovenox 90 mg SQ Q12H.   3/18:  Weight is now 104.4 kg.  Will increase dose to Lovenox 105mg  SQ [id number] as per policy.    Olivia Canter, RPh Clinical Pharmacist 11/21/2015,10:12 AM

## 2015-11-21 NOTE — ED Provider Notes (Signed)
[facility] Emergency Department Provider Note  ____________________________________________  Time seen: Approximately 1:08 AM  I have reviewed the triage vital signs and the nursing notes.   HISTORY  Chief Complaint Shortness of Breath    HPI Eshal Chalia Morua is a 50 y.o. female reports shortness of breath gradual onset over the last few hours tonight she's had a dry cough no fever. Chest is been feeling heavy for about an hour. Patient complains of feeling severely short of breath. She says she has a history of asthma and congestive heart failure and restless legs.   Past Medical History  Diagnosis Date  . Hx MRSA infection   . Asthma 2013  . Heart disease 2013  . Diverticulitis 2010  . Lump or mass in breast   . Endometriosis 1990  . Restless leg   . CHF (congestive heart failure) ([place])   . DVT (deep venous thrombosis) [facility])     Patient Active Problem List   Diagnosis Date Noted  . Respiratory distress 09/19/2015  . Breast pain 01/11/2013  . Hx MRSA infection   . Lump or mass in breast   . GOITER, MULTINODULAR 01/20/2009  . HYPOGLYCEMIA, UNSPECIFIED 01/20/2009  . GERD 01/20/2009  . DIVERTICULITIS OF COLON 01/20/2009    Past Surgical History  Procedure Laterality Date  . Abdominal hysterectomy      age 22  . Nasal sinus surgery  2012  . Colectomy    . Cholecystectomy    . Cesarean section      Current Outpatient Rx  Name  Route  Sig  Dispense  Refill  . albuterol (PROVENTIL HFA;VENTOLIN HFA) 108 (90 Base) MCG/ACT inhaler   Inhalation   Inhale 2 puffs into the lungs every 6 (six) hours as needed for wheezing or shortness of breath.   1 Inhaler   0   . albuterol (PROVENTIL) (2.5 MG/3ML) 0.083% nebulizer solution   Nebulization   Take 3 mLs (2.5 mg total) by nebulization every 6 (six) hours.   75 mL   1   . azithromycin (ZITHROMAX) 250 MG tablet      One tab daily for three days   3 each   0   .  chlorpheniramine-HYDROcodone (TUSSIONEX) 10-8 MG/5ML SUER   Oral   Take 5 mLs by mouth every 12 (twelve) hours as needed for cough.   50 mL   0   . fluticasone (FLOVENT HFA) 220 MCG/ACT inhaler   Inhalation   Inhale 1 puff into the lungs 2 (two) times daily.   1 Inhaler   12   . predniSONE (DELTASONE) 20 MG tablet      2 tabs daily for 5 days   10 tablet   0   . rOPINIRole (REQUIP) 2 MG tablet   Oral   Take 1 tablet (2 mg total) by mouth at bedtime. Patient taking differently: Take 4 mg by mouth at bedtime.    30 tablet   0     Allergies Contrast media; Latex; Ondansetron; Penicillins; and Povidone-iodine  Family History  Problem Relation Age of Onset  . Cancer Mother 30    ovarian  . Cancer Father 50    brain  . Cancer Daughter 18    skin  . Cancer Maternal Aunt 4    breast    Social History Social History  Substance Use Topics  . Smoking status: Never Smoker   . Smokeless tobacco: Never Used  . Alcohol Use: No    Review  of Systems Constitutional: No fever/chills Eyes: No visual changes. ENT: No sore throat. Cardiovascular: See history of present illness Respiratory: See history of present illness. Gastrointestinal: No abdominal pain.  No nausea, no vomiting.  No diarrhea.  No constipation. Genitourinary: Negative for dysuria. Musculoskeletal: Negative for back pain. Skin: Negative for rash. Neurological: Negative for headaches, focal weakness or numbness.  10-point ROS otherwise negative.  ____________________________________________   PHYSICAL EXAM:  VITAL SIGNS: ED Triage Vitals  Enc Vitals Group     BP 11/21/15 0057 125/85 mmHg     Pulse --      Resp 11/21/15 0057 35     Temp --      Temp src --      SpO2 11/21/15 0057 99 %     Weight --      Height --      Head Cir --      Peak Flow --      Pain Score --      Pain Loc --      Pain Edu? --      Excl. in [place]? --     Constitutional: Alert and oriented. Breathing hard and  appearing to be in respiratory distress. Eyes: Conjunctivae are normal. PERRL. EOMI. Head: Atraumatic. Nose: No congestion/rhinnorhea. Mouth/Throat: Mucous membranes are moist.  Oropharynx non-erythematous. Neck: No stridor.  Cardiovascular: Normal rate, regular rhythm. Grossly normal heart sounds.  Good peripheral circulation. Respiratory: Increased respiratory effort and really unable to see any retractions to the patient's obesity lungs are clear though Gastrointestinal: Soft and nontender. No distention. No abdominal bruits. No CVA tenderness. }Musculoskeletal: No lower extremity tenderness nor edema.  No joint effusions. Neurologic:  Normal speech and language. No gross focal neurologic deficits are appreciated. No gait instability. Skin:  Skin is warm, dry and intact. No rash noted. Psychiatric: Mood and affect are normal. Speech and behavior are normal.  ____________________________________________   LABS (all labs ordered are listed, but only abnormal results are displayed)  Labs Reviewed  CBC - Abnormal; Notable for the following:    WBC 13.9 (*)    MCV 78.6 (*)    RDW 15.6 (*)    All other components within normal limits  COMPREHENSIVE METABOLIC PANEL - Abnormal; Notable for the following:    Potassium 3.4 (*)    Glucose, Bld 110 (*)    All other components within normal limits  FIBRIN DERIVATIVES D-DIMER ([facility] ONLY) - Abnormal; Notable for the following:    Fibrin derivatives D-dimer ([facility]) 579 (*)    All other components within normal limits  DIFFERENTIAL - Abnormal; Notable for the following:    Neutro Abs 10.5 (*)    Monocytes Absolute 1.1 (*)    All other components within normal limits  BRAIN NATRIURETIC PEPTIDE  TROPONIN I  CBC WITH DIFFERENTIAL/PLATELET  TROPONIN I   ____________________________________________  EKG  EKG shows what appears to be sinus tachycardia rate of 128 left axis no acute ST-T wave changes there are occasional PACs as well. The  baseline is very irregular and difficult to see P waves and ____________________________________________  RADIOLOGY  Negative per radiology ____________________________________________   PROCEDURES  She shows no marked improvement after 2 DuoNeb patient still tachycardic and feels trouble breathing.  ____________________________________________   INITIAL IMPRESSION / ASSESSMENT AND PLAN / ED COURSE  Pertinent labs & imaging results that were available during my care of the patient were reviewed by me and considered in my medical decision making (see chart for details).  Patient has a history of breast cancer has a history of DVT patient still tachycardic to Will admit her for a VQ scan and heparinize in the meantime with Lovenox. ____________________________________________   FINAL CLINICAL IMPRESSION(S) / ED DIAGNOSES  Final diagnoses:  Dyspnea  Tachycardia      Nena Polio, MD 11/24/15 1135

## 2015-11-21 NOTE — ED Notes (Signed)
Pt via POV byself co SOB worse with exertion, hx of CHF and asthma.  Audible wheeze.

## 2015-11-21 NOTE — Progress Notes (Signed)
*  PRELIMINARY RESULTS* Echocardiogram 2D Echocardiogram has been performed.  Brittany Nguyen 11/21/2015, 9:17 AM

## 2015-11-21 NOTE — Progress Notes (Signed)
[place] [facility] [place] NAME: Bera Dwelle    MR#:  [id number]  DATE OF BIRTH:  07-11-1966  SUBJECTIVE:  CHIEF COMPLAINT:   Chief Complaint  Patient presents with  . Shortness of Breath   patient is a 50 year old Caucasian female with medical history significant for the. History of asthma, endometriosis, restless leg syndrome, CHF, DVT, pulmonary embolism who presents to the hospital with complaints of shortness of breath as well as cough, she had an x-ray done in the emergency room as well as VQ scan which was negative for pulmonary embolism or pneumonia. Patient is complaining of across the chest pain worsening with cough, dry nonproductive cough. Apparently patient was treated with Zithromax and finished a course recently. She does not feel yet better, although somewhat more comfortable with oxygen therapy here in the hospital  Review of Systems  Constitutional: Negative for fever, chills and weight loss.  HENT: Negative for congestion.   Eyes: Negative for blurred vision and double vision.  Respiratory: Positive for cough and shortness of breath. Negative for sputum production and wheezing.   Cardiovascular: Positive for chest pain. Negative for palpitations, orthopnea, leg swelling and PND.  Gastrointestinal: Negative for nausea, vomiting, abdominal pain, diarrhea, constipation and blood in stool.  Genitourinary: Negative for dysuria, urgency, frequency and hematuria.  Musculoskeletal: Negative for falls.  Neurological: Negative for dizziness, tremors, focal weakness and headaches.  Endo/Heme/Allergies: Does not bruise/bleed easily.  Psychiatric/Behavioral: Negative for depression. The patient does not have insomnia.     VITAL SIGNS: Blood pressure 117/66, pulse 107, temperature 97.8 F (36.6 C), temperature source Oral, resp. rate 18, height 5\' 5"  (1.651 m), weight 104.373 kg (230 lb 1.6 oz), SpO2 95 %.  PHYSICAL EXAMINATION:    GENERAL:  50 y.o.-year-old patient lying in the bed in moderate respiratory distress due to paroxysmal cough.  EYES: Pupils equal, round, reactive to light and accommodation. No scleral icterus. Extraocular muscles intact.  HEENT: Head atraumatic, normocephalic. Oropharynx and nasopharynx clear.  NECK:  Supple, no jugular venous distention. No thyroid enlargement, no tenderness.  LUNGS: Normal breath sounds bilaterally, no wheezing, rales,rhonchi or crepitation. Intermittent use of accessory muscles of respiration, especially with paroxysmal cough.  CARDIOVASCULAR: S1, S2 , tachypneic. No murmurs, rubs, or gallops.  ABDOMEN: Soft, nontender, nondistended. Bowel sounds present. No organomegaly or mass.  EXTREMITIES: No pedal edema, cyanosis, or clubbing.  NEUROLOGIC: Cranial nerves II through XII are intact. Muscle strength 5/5 in all extremities. Sensation intact. Gait not checked.  PSYCHIATRIC: The patient is alert and oriented x 3.  SKIN: No obvious rash, lesion, or ulcer.   ORDERS/RESULTS REVIEWED:   CBC  Recent Labs Lab 11/21/15 0102 11/21/15 0700  WBC 13.9* 8.2  HGB 12.2 12.2  HCT 36.5 36.4  PLT 318 279  MCV 78.6* 79.0*  MCH 26.2 26.4  MCHC 33.4 33.4  RDW 15.6* 15.5*  LYMPHSABS 3.2  --   MONOABS 1.1*  --   EOSABS 0.3  --   BASOSABS 0.1  --    ------------------------------------------------------------------------------------------------------------------  Chemistries   Recent Labs Lab 11/21/15 0102 11/21/15 0700  NA 138 139  K 3.4* 3.6  CL 107 108  CO2 23 23  GLUCOSE 110* 185*  BUN 13 13  CREATININE 0.92 0.94  CALCIUM 8.9 9.0  AST 23  --   ALT 22  --   ALKPHOS 97  --   BILITOT 0.5  --    ------------------------------------------------------------------------------------------------------------------ estimated creatinine clearance is 88.8  mL/min (by C-G formula based on Cr of  0.94). ------------------------------------------------------------------------------------------------------------------ No results for input(s): TSH, T4TOTAL, T3FREE, THYROIDAB in the last 72 hours.  Invalid input(s): FREET3  Cardiac Enzymes  Recent Labs Lab 11/21/15 0102 11/21/15 0359  TROPONINI <0.03 <0.03   ------------------------------------------------------------------------------------------------------------------ Invalid input(s): POCBNP ---------------------------------------------------------------------------------------------------------------  RADIOLOGY: Nm Pulmonary Perf And Vent  11/21/2015  CLINICAL DATA:  Shortness of breath EXAM: NUCLEAR MEDICINE VENTILATION - PERFUSION LUNG SCAN TECHNIQUE: Ventilation images were obtained in multiple projections using inhaled aerosol Tc-37m DTPA. Perfusion images were obtained in multiple projections after intravenous injection of Tc-20m MAA. RADIOPHARMACEUTICALS:  31.9 Technetium-17m DTPA aerosol inhalation and 3.9 Technetium-63m MAA IV COMPARISON:  Chest x-ray from earlier today FINDINGS: Ventilation: No focal ventilation defect. Perfusion: No wedge shaped peripheral perfusion defects to suggest acute pulmonary embolism. IMPRESSION: No evidence for pulmonary embolus.  Normal V/Q scan. Electronically Signed   By: Dorise Bullion III M.D   On: 11/21/2015 11:52   Dg Chest Port 1 View  11/21/2015  CLINICAL DATA:  Acute onset of shortness of breath with exertion and cough. Initial encounter. EXAM: PORTABLE CHEST 1 VIEW COMPARISON:  Chest radiograph performed 09/19/2015 FINDINGS: The lungs are well-aerated and clear. There is no evidence of focal opacification, pleural effusion or pneumothorax. The cardiomediastinal silhouette is within normal limits. No acute osseous abnormalities are seen. IMPRESSION: No acute cardiopulmonary process seen. Electronically Signed   By: Garald Balding M.D.   On: 11/21/2015 02:07    EKG:  Orders placed or  performed during the hospital encounter of 11/21/15  . EKG 12-Lead  . EKG 12-Lead    ASSESSMENT AND PLAN:  Active Problems:   Dyspnea  #1. Dyspnea, likely due to COPD exacerbation, viral infection, continue oxygen therapy, wean off as tolerated #2. Cough due to acute bronchitis, continue patient on Tussionex. Add Humibid #3. Hypokalemia, resolved. #4. Leukocytosis, resolved  Management plans discussed with the patient, family and they are in agreement.   DRUG ALLERGIES:  Allergies  Allergen Reactions  . Contrast Media [Iodinated Diagnostic Agents] Shortness Of Breath  . Latex Hives  . Ondansetron Other (See Comments)    Severe headache  . Penicillins Hives    Has patient had a PCN reaction causing immediate rash, facial/tongue/throat swelling, SOB or lightheadedness with hypotension: Yes Has patient had a PCN reaction causing severe rash involving mucus membranes or skin necrosis: No Has patient had a PCN reaction that required hospitalization No Has patient had a PCN reaction occurring within the last 10 years: No If all of the above answers are "NO", then may proceed with Cephalosporin use.  . Povidone-Iodine Rash    CODE STATUS:     Code Status Orders        Start     Ordered   11/21/15 0651  Full code   Continuous     11/21/15 0650    Code Status History    Date Active Date Inactive Code Status Order ID Comments User Context   09/19/2015  5:11 AM 09/21/2015  6:29 PM Full Code [id number]  Harrie Foreman, MD Inpatient      TOTAL TIME TAKING CARE OF THIS PATIENT: 40 minutes.    Theodoro Grist M.D on 11/21/2015 at 12:50 PM  Between 7am to 6pm - Pager - [phone]  After 6pm go to [email or web address] - password [email or web address] [facility]  [facility]  Office  [phone]  CC: Primary care physician; [name], MD

## 2015-11-21 NOTE — ED Notes (Signed)
XR at bedside

## 2015-11-21 NOTE — Progress Notes (Signed)
Patient has rested most of the day. Able to walk to bathroom with no assistance. Complaining of upper chest and rib pain that she describes as tightness from her cough. Also complained of nausea twice, given phenergan with relief. Patient states she believes the morphine is making her nauseous. Paged MD and got codeine ordered to try for pain. Sinus tach in the low 100's on tele. Will continue to monitor.

## 2015-11-22 DIAGNOSIS — J209 Acute bronchitis, unspecified: Secondary | ICD-10-CM

## 2015-11-22 DIAGNOSIS — R05 Cough: Secondary | ICD-10-CM

## 2015-11-22 DIAGNOSIS — E876 Hypokalemia: Secondary | ICD-10-CM

## 2015-11-22 DIAGNOSIS — R053 Chronic cough: Secondary | ICD-10-CM

## 2015-11-22 DIAGNOSIS — D72829 Elevated white blood cell count, unspecified: Secondary | ICD-10-CM

## 2015-11-22 MED ORDER — CODEINE SULFATE 30 MG PO TABS
30.0000 mg | ORAL_TABLET | Freq: Four times a day (QID) | ORAL | Status: DC | PRN
Start: 2015-11-22 — End: 2016-06-06

## 2015-11-22 MED ORDER — GUAIFENESIN ER 600 MG PO TB12: 600.0000 mg | ORAL_TABLET | Freq: Two times a day (BID) | ORAL | Status: DC

## 2015-11-22 MED ORDER — AZITHROMYCIN 250 MG PO TABS: ORAL_TABLET | ORAL | Status: DC

## 2015-11-22 MED ORDER — HYDROCOD POLST-CPM POLST ER 10-8 MG/5ML PO SUER: 5.0000 mL | Freq: Two times a day (BID) | ORAL | Status: DC

## 2015-11-22 NOTE — Plan of Care (Signed)
Problem: Safety: Goal: Ability to remain free from injury will improve Outcome: Progressing Pt free from falls, bed alarm set.  Problem: Pain Managment: Goal: General experience of comfort will improve Outcome: Progressing Pt with c/o headache and chest discomfort from frequent coughing. PRN meds given per MD order.   Problem: Physical Regulation: Goal: Ability to maintain clinical measurements within normal limits will improve Outcome: Progressing Pt with steady gait. Goal: Will remain free from infection Outcome: Progressing Will cont to monitor WBC.  Problem: Tissue Perfusion: Goal: Risk factors for ineffective tissue perfusion will decrease Outcome: Progressing Pt on lovenox therapy.   Problem: Phase I Progression Outcomes Goal: Dyspnea controlled at rest Outcome: Not Progressing Pt with SOB w/ talking.

## 2015-11-22 NOTE — Progress Notes (Signed)
A & O. Ambulated around the room and tolerated it well. No pain. NSR. Room air. IV and tele removed. Prescriptions given to pt. Discharge instructions given to pt. Pt has no further concerns at this time.

## 2015-11-22 NOTE — Progress Notes (Signed)
        To Whom It May Concern:      Mrs. Doranne, [place] was hospitalized at [facility] from 3.18.17 through 3.19.17. She is to return back to work at full capacity 3.26.17. Thank you for your understanding.        Sincerely,  Theodoro Grist, MD

## 2015-11-22 NOTE — Discharge Summary (Signed)
[place] at [place] NAME: Brittany Nguyen    MR#:  [id number]  DATE OF BIRTH:  1966/05/26  DATE OF ADMISSION:  11/21/2015 ADMITTING PHYSICIAN: Saundra Shelling, MD  DATE OF DISCHARGE: 11/22/2015 12:50 PM  PRIMARY CARE PHYSICIAN: [name], MD     ADMISSION DIAGNOSIS:  Dyspnea [R06.00] Tachycardia [R00.0]  DISCHARGE DIAGNOSIS:  Principal Problem:   Dyspnea Active Problems:   Acute bronchitis   Cough, persistent   Hypokalemia   Leukocytosis   SECONDARY DIAGNOSIS:   Past Medical History  Diagnosis Date  . Hx MRSA infection   . Asthma 2013  . Heart disease 2013  . Diverticulitis 2010  . Lump or mass in breast   . Endometriosis 1990  . Restless leg   . CHF (congestive heart failure) ([place])   . DVT (deep venous thrombosis) ([place])     .pro HOSPITAL COURSE:   Patient is a 50 year old female with past medical history significant for history of multiple medical problems including asthma, heart disease, endometriosis, restless leg syndrome, CHF, DVT, who presents to the hospital with complaints of shortness of breath and dry cough. On arrival to the hospital. Patient's labs were remarkable for mild hypokalemia, mild leukocytosis, normal cardiac enzymes. Patient's chest x-ray was unremarkable as well as her VQ scan. Patient was admitted to the hospital with diagnosis of acute bronchitis, cough, dyspnea due to above and was initiated on cough suppressants, with which her condition improved but not completely resolved. Oxygenation, however, remains stable with oxygen levels of 98% on 1 L of oxygen through nasal cannula. It was felt that patient is stable to be discharged home. Discussion by problem 1. Dyspnea, likely mild COPD exacerbation, viral infection, patient was weaned off oxygen therapy and they felt satisfactory better with current therapy. Patient is to continue cough suppressants, antibiotic therapy, albuterol inhalers,  fluticasone inhaler. 2. Cough, likely due to acute bronchitis, continue patient on Tussionex, Humibid, codeine when necessary, patient is to follow-up with primary care physician and advanced medications if needed, patient's cough is paroxysmal, concerning for pertussis, patient is to continue antibiotic therapy to be with azithromycin for suspected pertussis, patient may benefit from immunization boost 3. Hypokalemia, resolved. 4. Leukocytosis, resolved  DISCHARGE CONDITIONS:   Stable  CONSULTS OBTAINED:     DRUG ALLERGIES:   Allergies  Allergen Reactions  . Contrast Media [Iodinated Diagnostic Agents] Shortness Of Breath  . Latex Hives  . Ondansetron Other (See Comments)    Severe headache  . Penicillins Hives    Has patient had a PCN reaction causing immediate rash, facial/tongue/throat swelling, SOB or lightheadedness with hypotension: Yes Has patient had a PCN reaction causing severe rash involving mucus membranes or skin necrosis: No Has patient had a PCN reaction that required hospitalization No Has patient had a PCN reaction occurring within the last 10 years: No If all of the above answers are "NO", then may proceed with Cephalosporin use.  . Povidone-Iodine Rash    DISCHARGE MEDICATIONS:   Discharge Medication List as of 11/22/2015 12:27 PM    START taking these medications   Details  codeine 30 MG tablet Take 1 tablet (30 mg total) by mouth every 6 (six) hours as needed for moderate pain., Starting 11/22/2015, Until Discontinued, Print    guaiFENesin (MUCINEX) 600 MG 12 hr tablet Take 1 tablet (600 mg total) by mouth 2 (two) times daily., Starting 11/22/2015, Until Discontinued, Normal      CONTINUE these medications which  have CHANGED   Details  azithromycin (ZITHROMAX Z-PAK) 250 MG tablet Take as directed, Normal    chlorpheniramine-HYDROcodone (TUSSIONEX) 10-8 MG/5ML SUER Take 5 mLs by mouth every 12 (twelve) hours., Starting 11/22/2015, Until Discontinued,  Normal      CONTINUE these medications which have NOT CHANGED   Details  albuterol (PROVENTIL HFA;VENTOLIN HFA) 108 (90 Base) MCG/ACT inhaler Inhale 2 puffs into the lungs every 6 (six) hours as needed for wheezing or shortness of breath., Starting 09/21/2015, Until Discontinued, Print    albuterol (PROVENTIL) (2.5 MG/3ML) 0.083% nebulizer solution Take 3 mLs (2.5 mg total) by nebulization every 6 (six) hours., Starting 09/21/2015, Until Discontinued, Print    fluticasone (FLOVENT HFA) 220 MCG/ACT inhaler Inhale 1 puff into the lungs 2 (two) times daily., Starting 09/21/2015, Until Discontinued, Print    rOPINIRole (REQUIP) 2 MG tablet Take 1 tablet (2 mg total) by mouth at bedtime., Starting 05/14/2015, Until Discontinued, Print    predniSONE (DELTASONE) 20 MG tablet 2 tabs daily for 5 days, Print         DISCHARGE INSTRUCTIONS:    Patient is to follow-up with primary care physician as outpatient  If you experience worsening of your admission symptoms, develop shortness of breath, life threatening emergency, suicidal or homicidal thoughts you must seek medical attention immediately by calling 911 or calling your MD immediately  if symptoms less severe.  You Must read complete instructions/literature along with all the possible adverse reactions/side effects for all the Medicines you take and that have been prescribed to you. Take any new Medicines after you have completely understood and accept all the possible adverse reactions/side effects.   Please note  You were cared for by a hospitalist during your hospital stay. If you have any questions about your discharge medications or the care you received while you were in the hospital after you are discharged, you can call the unit and asked to speak with the hospitalist on call if the hospitalist that took care of you is not available. Once you are discharged, your primary care physician will handle any further medical issues. Please note  that NO REFILLS for any discharge medications will be authorized once you are discharged, as it is imperative that you return to your primary care physician (or establish a relationship with a primary care physician if you do not have one) for your aftercare needs so that they can reassess your need for medications and monitor your lab values.    Today   CHIEF COMPLAINT:   Chief Complaint  Patient presents with  . Shortness of Breath    HISTORY OF PRESENT ILLNESS:  Brittany Nguyen  is a 50 y.o. female with a known history of multiple medical problems including asthma, heart disease, endometriosis, restless leg syndrome, CHF, DVT, who presents to the hospital with complaints of shortness of breath and dry cough. On arrival to the hospital. Patient's labs were remarkable for mild hypokalemia, mild leukocytosis, normal cardiac enzymes. Patient's chest x-ray was unremarkable as well as her VQ scan. Patient was admitted to the hospital with diagnosis of acute bronchitis, cough, dyspnea due to above and was initiated on cough suppressants, with which her condition improved but not completely resolved. Oxygenation, however, remains stable with oxygen levels of 98% on 1 L of oxygen through nasal cannula. It was felt that patient is stable to be discharged home. Discussion by problem 1. Dyspnea, likely mild COPD exacerbation, viral infection, patient was weaned off oxygen therapy and they felt satisfactory  better with current therapy. Patient is to continue cough suppressants, antibiotic therapy, albuterol inhalers, fluticasone inhaler. 2. Cough, likely due to acute bronchitis, continue patient on Tussionex, Humibid, codeine when necessary, patient is to follow-up with primary care physician and advanced medications if needed, patient's cough is paroxysmal, concerning for pertussis, patient is to continue antibiotic therapy to be with azithromycin for suspected pertussis, patient may benefit from immunization  boost 3. Hypokalemia, resolved. 4. Leukocytosis, resolved    VITAL SIGNS:  Blood pressure 113/68, pulse 89, temperature 97.7 F (36.5 C), temperature source Oral, resp. rate 18, height 5\' 5"  (1.651 m), weight 104.373 kg (230 lb 1.6 oz), SpO2 98 %.  I/O:   Intake/Output Summary (Last 24 hours) at 11/22/15 1654 Last data filed at 11/22/15 1218  Gross per 24 hour  Intake    480 ml  Output   1625 ml  Net  -1145 ml    PHYSICAL EXAMINATION:  GENERAL:  50 y.o.-year-old patient lying in the bed with no acute distress.  EYES: Pupils equal, round, reactive to light and accommodation. No scleral icterus. Extraocular muscles intact.  HEENT: Head atraumatic, normocephalic. Oropharynx and nasopharynx clear.  NECK:  Supple, no jugular venous distention. No thyroid enlargement, no tenderness.  LUNGS: Normal breath sounds bilaterally, no wheezing, rales,rhonchi or crepitation. No use of accessory muscles of respiration.  CARDIOVASCULAR: S1, S2 normal. No murmurs, rubs, or gallops.  ABDOMEN: Soft, non-tender, non-distended. Bowel sounds present. No organomegaly or mass.  EXTREMITIES: No pedal edema, cyanosis, or clubbing.  NEUROLOGIC: Cranial nerves II through XII are intact. Muscle strength 5/5 in all extremities. Sensation intact. Gait not checked.  PSYCHIATRIC: The patient is alert and oriented x 3.  SKIN: No obvious rash, lesion, or ulcer.   DATA REVIEW:   CBC  Recent Labs Lab 11/21/15 0700  WBC 8.2  HGB 12.2  HCT 36.4  PLT 279    Chemistries   Recent Labs Lab 11/21/15 0102 11/21/15 0700  NA 138 139  K 3.4* 3.6  CL 107 108  CO2 23 23  GLUCOSE 110* 185*  BUN 13 13  CREATININE 0.92 0.94  CALCIUM 8.9 9.0  AST 23  --   ALT 22  --   ALKPHOS 97  --   BILITOT 0.5  --     Cardiac Enzymes  Recent Labs Lab 11/21/15 0359  TROPONINI <0.03    Microbiology Results  Results for orders placed or performed during the hospital encounter of 09/19/15  Culture, blood (routine  x 2)     Status: None   Collection Time: 09/19/15 12:36 AM  Result Value Ref Range Status   Specimen Description BLOOD RIGHT ASSIST CONTROL  Final   Special Requests BOTTLES DRAWN AEROBIC AND ANAEROBIC [id number]  Final   Culture NO GROWTH 5 DAYS  Final   Report Status 09/24/2015 FINAL  Final  Culture, blood (routine x 2)     Status: None   Collection Time: 09/19/15  2:48 AM  Result Value Ref Range Status   Specimen Description BLOOD ARM  Final   Special Requests BOTTLES DRAWN AEROBIC AND ANAEROBIC 10CC  Final   Culture NO GROWTH 5 DAYS  Final   Report Status 09/24/2015 FINAL  Final    RADIOLOGY:  Nm Pulmonary Perf And Vent  11/21/2015  CLINICAL DATA:  Shortness of breath EXAM: NUCLEAR MEDICINE VENTILATION - PERFUSION LUNG SCAN TECHNIQUE: Ventilation images were obtained in multiple projections using inhaled aerosol Tc-61m DTPA. Perfusion images were obtained in multiple projections after  intravenous injection of Tc-79m MAA. RADIOPHARMACEUTICALS:  31.9 Technetium-72m DTPA aerosol inhalation and 3.9 Technetium-40m MAA IV COMPARISON:  Chest x-ray from earlier today FINDINGS: Ventilation: No focal ventilation defect. Perfusion: No wedge shaped peripheral perfusion defects to suggest acute pulmonary embolism. IMPRESSION: No evidence for pulmonary embolus.  Normal V/Q scan. Electronically Signed   By: Dorise Bullion III M.D   On: 11/21/2015 11:52   Dg Chest Port 1 View  11/21/2015  CLINICAL DATA:  Acute onset of shortness of breath with exertion and cough. Initial encounter. EXAM: PORTABLE CHEST 1 VIEW COMPARISON:  Chest radiograph performed 09/19/2015 FINDINGS: The lungs are well-aerated and clear. There is no evidence of focal opacification, pleural effusion or pneumothorax. The cardiomediastinal silhouette is within normal limits. No acute osseous abnormalities are seen. IMPRESSION: No acute cardiopulmonary process seen. Electronically Signed   By: Garald Balding M.D.   On: 11/21/2015 02:07     EKG:   Orders placed or performed during the hospital encounter of 11/21/15  . EKG 12-Lead  . EKG 12-Lead      Management plans discussed with the patient, family and they are in agreement.  CODE STATUS:  Code Status History    Date Active Date Inactive Code Status Order ID Comments User Context   11/21/2015  6:50 AM 11/22/2015  3:50 PM Full Code [id number]  Saundra Shelling, MD Inpatient   09/19/2015  5:11 AM 09/21/2015  6:29 PM Full Code [id number]  Harrie Foreman, MD Inpatient      TOTAL TIME TAKING CARE OF THIS PATIENT: 40 minutes.    Theodoro Grist M.D on 11/22/2015 at 4:54 PM  Between 7am to 6pm - Pager - [phone]  After 6pm go to [email or web address] - password [email or web address] [facility]  [facility]  Office  [phone]  CC: Primary care physician; [name], MD

## 2015-11-23 LAB — ECHOCARDIOGRAM COMPLETE
Height: 65 in
Weight: 3681.6 oz

## 2015-12-08 ENCOUNTER — Emergency Department: Payer: MEDICAID

## 2015-12-08 ENCOUNTER — Other Ambulatory Visit: Payer: Self-pay

## 2015-12-08 ENCOUNTER — Emergency Department
Admission: EM | Admit: 2015-12-08 | Discharge: 2015-12-08 | Disposition: A | Discharge status: Home / Self Care | Payer: MEDICAID | Attending: Emergency Medicine | Admitting: Emergency Medicine

## 2015-12-08 DIAGNOSIS — G2581 Restless legs syndrome: Secondary | ICD-10-CM | POA: Insufficient documentation

## 2015-12-08 DIAGNOSIS — I509 Heart failure, unspecified: Secondary | ICD-10-CM | POA: Insufficient documentation

## 2015-12-08 DIAGNOSIS — R05 Cough: Secondary | ICD-10-CM

## 2015-12-08 DIAGNOSIS — R059 Cough, unspecified: Secondary | ICD-10-CM

## 2015-12-08 DIAGNOSIS — J45909 Unspecified asthma, uncomplicated: Secondary | ICD-10-CM | POA: Insufficient documentation

## 2015-12-08 LAB — BASIC METABOLIC PANEL
Anion gap: 7 (ref 5–15)
BUN: 11 mg/dL (ref 6–20)
CHLORIDE: 105 mmol/L (ref 101–111)
CO2: 23 mmol/L (ref 22–32)
CREATININE: 0.97 mg/dL (ref 0.44–1.00)
Calcium: 9.5 mg/dL (ref 8.9–10.3)
GFR calc Af Amer: >60 mL/min (ref >60)
GFR calc non Af Amer: >60 mL/min (ref >60)
GLUCOSE: 111 mg/dL — AB (ref 65–99)
POTASSIUM: 3.4 mmol/L — AB (ref 3.5–5.1)
SODIUM: 135 mmol/L (ref 135–145)

## 2015-12-08 LAB — CBC
HCT: 38 % (ref 35.0–47.0)
Hemoglobin: 13 g/dL (ref 12.0–16.0)
MCH: 26.2 pg (ref 26.0–34.0)
MCHC: 34.1 g/dL (ref 32.0–36.0)
MCV: 76.7 fL — ABNORMAL LOW (ref 80.0–100.0)
Platelets: 301 10*3/uL (ref 150–440)
RBC: 4.95 MIL/uL (ref 3.80–5.20)
RDW: 16.2 % — ABNORMAL HIGH (ref 11.5–14.5)
WBC: 11.4 10*3/uL — AB (ref 3.6–11.0)

## 2015-12-08 LAB — TROPONIN I

## 2015-12-08 MED ORDER — HYDROCOD POLST-CPM POLST ER 10-8 MG/5ML PO SUER
5.0000 mL | Freq: Once | ORAL | Status: AC
  Administered 2015-12-08: 5 mL via ORAL
  Filled 2015-12-08: qty 5

## 2015-12-08 MED ORDER — HYDROCOD POLST-CPM POLST ER 10-8 MG/5ML PO SUER: 5.0000 mL | Freq: Two times a day (BID) | ORAL | Status: DC

## 2015-12-08 MED ORDER — DIAZEPAM 5 MG PO TABS
10.0000 mg | ORAL_TABLET | Freq: Once | ORAL | Status: AC
  Administered 2015-12-08: 10 mg via ORAL
  Filled 2015-12-08: qty 2

## 2015-12-08 MED ORDER — DIAZEPAM 5 MG PO TABS: 5.0000 mg | ORAL_TABLET | Freq: Three times a day (TID) | ORAL | Status: DC | PRN

## 2015-12-08 NOTE — Discharge Instructions (Signed)
Cough, Adult Coughing is a reflex that clears your throat and your airways. Coughing helps to heal and protect your lungs. It is normal to cough occasionally, but a cough that happens with other symptoms or lasts a long time may be a sign of a condition that needs treatment. A cough may last only 2-3 weeks (acute), or it may last longer than 8 weeks (chronic). CAUSES Coughing is commonly caused by:  Breathing in substances that irritate your lungs.  A viral or bacterial respiratory infection.  Allergies.  Asthma.  Postnasal drip.  Smoking.  Acid backing up from the stomach into the esophagus (gastroesophageal reflux).  Certain medicines.  Chronic lung problems, including COPD (or rarely, lung cancer).  Other medical conditions such as heart failure. HOME CARE INSTRUCTIONS  Pay attention to any changes in your symptoms. Take these actions to help with your discomfort:  Take medicines only as told by your health care provider.  If you were prescribed an antibiotic medicine, take it as told by your health care provider. Do not stop taking the antibiotic even if you start to feel better.  Talk with your health care provider before you take a cough suppressant medicine.  Drink enough fluid to keep your urine clear or pale yellow.  If the air is dry, use a cold steam vaporizer or humidifier in your bedroom or your home to help loosen secretions.  Avoid anything that causes you to cough at work or at home.  If your cough is worse at night, try sleeping in a semi-upright position.  Avoid cigarette smoke. If you smoke, quit smoking. If you need help quitting, ask your health care provider.  Avoid caffeine.  Avoid alcohol.  Rest as needed. SEEK MEDICAL CARE IF:   You have new symptoms.  You cough up pus.  Your cough does not get better after 2-3 weeks, or your cough gets worse.  You cannot control your cough with suppressant medicines and you are losing sleep.  You  develop pain that is getting worse or pain that is not controlled with pain medicines.  You have a fever.  You have unexplained weight loss.  You have night sweats. SEEK IMMEDIATE MEDICAL CARE IF:  You cough up blood.  You have difficulty breathing.  Your heartbeat is very fast.   This information is not intended to replace advice given to you by your health care provider. Make sure you discuss any questions you have with your health care provider.   Document Released: 02/18/2011 Document Revised: 05/13/2015 Document Reviewed: 10/29/2014 Elsevier Interactive Patient Education 2016 Elsevier Inc.  Restless Legs Syndrome Restless legs syndrome is a condition that causes uncomfortable feelings or sensations in the legs, especially while sitting or lying down. The sensations usually cause an overwhelming urge to move the legs. The arms can also sometimes be affected. The condition can range from mild to severe. The symptoms often interfere with a person's ability to sleep. CAUSES The cause of this condition is not known. RISK FACTORS This condition is more likely to develop in:  People who are older than age 75.  Pregnant women. In general, restless legs syndrome is more common in women than in men.  People who have a family history of the condition.  People who have certain medical conditions, such as iron deficiency, kidney disease, Parkinson disease, or nerve damage.  People who take certain medicines, such as medicines for high blood pressure, nausea, colds, allergies, depression, and some heart conditions. SYMPTOMS The main  symptom of this condition is uncomfortable sensations in the legs. These sensations may be:  Described as pulling, tingling, prickling, throbbing, crawling, or burning.  Worse while you are sitting or lying down.  Worse during periods of rest or inactivity.  Worse at night, often interfering with your sleep.  Accompanied by a very strong urge to  move your legs.  Temporarily relieved by movement of your legs. The sensations usually affect both sides of the body. The arms can also be affected, but this is rare. People who have this condition often have tiredness during the day because of their lack of sleep at night. DIAGNOSIS This condition may be diagnosed based on your description of the symptoms. You may also have tests, including blood tests, to check for other conditions that may lead to your symptoms. In some cases, you may be asked to spend some time in a sleep lab so your sleeping can be monitored. TREATMENT Treatment for this condition is focused on managing the symptoms. Treatment may include:  Self-help and lifestyle changes.  Medicines. HOME CARE INSTRUCTIONS  Take medicines only as directed by your health care provider.  Try these methods to get temporary relief from the uncomfortable sensations:  Massage your legs.  Walk or stretch.  Take a cold or hot bath.  Practice good sleep habits. For example, go to bed and get up at the same time every day.  Exercise regularly.  Practice ways of relaxing, such as yoga or meditation.  Avoid caffeine and alcohol.  Do not use any tobacco products, including cigarettes, chewing tobacco, or electronic cigarettes. If you need help quitting, ask your health care provider.  Keep all follow-up visits as directed by your health care provider. This is important. SEEK MEDICAL CARE IF: Your symptoms do not improve with treatment, or they get worse.   This information is not intended to replace advice given to you by your health care provider. Make sure you discuss any questions you have with your health care provider.   Document Released: 08/12/2002 Document Revised: 01/06/2015 Document Reviewed: 08/18/2014 Elsevier Interactive Patient Education [employer].

## 2015-12-08 NOTE — ED Provider Notes (Signed)
[facility] Emergency Department Provider Note     Time seen: ----------------------------------------- 7:21 AM on 12/08/2015 -----------------------------------------    I have reviewed the triage vital signs and the nursing notes.   HISTORY  Chief Complaint Shortness of Breath    HPI Brittany Nguyen is a 50 y.o. female who presents to ER for shortness of breath with cough for the last 2 days. Patient is complaining of bilateral leg pain and worse than normal restless leg syndrome symptoms. Patient states both legs are hurting in her typical restless leg distribution, states her Requip has not helped her for the last several days. She denies any fever, but has a cough and congestion, denies chest pain, denies nausea vomiting or diarrhea. She was recently in the admitted in the hospital and had a normal VQ scanand echocardiogram.   Past Medical History  Diagnosis Date  . Hx MRSA infection   . Asthma 2013  . Heart disease 2013  . Diverticulitis 2010  . Lump or mass in breast   . Endometriosis 1990  . Restless leg   . CHF (congestive heart failure) ([place])   . DVT (deep venous thrombosis) [facility])     Patient Active Problem List   Diagnosis Date Noted  . Acute bronchitis 11/22/2015  . Cough, persistent 11/22/2015  . Hypokalemia 11/22/2015  . Leukocytosis 11/22/2015  . Dyspnea 11/21/2015  . Respiratory distress 09/19/2015  . Breast pain 01/11/2013  . Hx MRSA infection   . Lump or mass in breast   . GOITER, MULTINODULAR 01/20/2009  . HYPOGLYCEMIA, UNSPECIFIED 01/20/2009  . GERD 01/20/2009  . DIVERTICULITIS OF COLON 01/20/2009    Past Surgical History  Procedure Laterality Date  . Abdominal hysterectomy      age 21  . Nasal sinus surgery  2012  . Colectomy    . Cholecystectomy    . Cesarean section      Allergies Contrast media; Latex; Ondansetron; Penicillins; and Povidone-iodine  Social History Social History  Substance Use  Topics  . Smoking status: Never Smoker   . Smokeless tobacco: Never Used  . Alcohol Use: No    Review of Systems Constitutional: Negative for fever. Eyes: Negative for visual changes. ENT: Negative for sore throat. Cardiovascular: Negative for chest pain. Respiratory: Positive shortness of breath and cough Gastrointestinal: Negative for abdominal pain, vomiting and diarrhea. Genitourinary: Negative for dysuria. Musculoskeletal: Positive for bilateral leg pain Skin: Negative for rash. Neurological: Negative for headaches, focal weakness or numbness.  10-point ROS otherwise negative.  ____________________________________________   PHYSICAL EXAM:  VITAL SIGNS: ED Triage Vitals  Enc Vitals Group     BP 12/08/15 0537 121/96 mmHg     Pulse Rate 12/08/15 0537 120     Resp 12/08/15 0537 22     Temp 12/08/15 0537 98.2 F (36.8 C)     Temp Source 12/08/15 0537 Oral     SpO2 12/08/15 0537 97 %     Weight 12/08/15 0537 200 lb (90.719 kg)     Height 12/08/15 0537 5\' 5"  (1.651 m)     Head Cir --      Peak Flow --      Pain Score 12/08/15 0536 8     Pain Loc --      Pain Edu? --      Excl. in [place]? --    Constitutional: Alert and oriented. Anxious appearing, no acute distress Eyes: Conjunctivae are normal. PERRL. Normal extraocular movements. ENT   Head: Normocephalic and atraumatic.  Nose: No congestion/rhinnorhea.   Mouth/Throat: Mucous membranes are moist.   Neck: No stridor. Cardiovascular: Normal rate, regular rhythm. Normal and symmetric distal pulses are present in all extremities. No murmurs, rubs, or gallops. Respiratory: Normal respiratory effort without tachypnea nor retractions. Breath sounds are clear and equal bilaterally. No wheezes/rales/rhonchi. Gastrointestinal: Soft and nontender. No distention. No abdominal bruits.  Musculoskeletal: Nontender with normal range of motion in all extremities. No joint effusions.  No lower extremity tenderness nor  edema. I do not appreciate any edema in her legs. Neurologic:  Normal speech and language. No gross focal neurologic deficits are appreciated.  Skin:  Skin is warm, dry and intact. No rash noted. Psychiatric: Mood and affect are normal.  ____________________________________________  EKG: Interpreted by me. Sinus tachycardia with frequent PVCs, rate was 120 bpm, normal. Normal, normal QRS, normal QT interval. Left axis deviation  ____________________________________________  ED COURSE:  Pertinent labs & imaging results that were available during my care of the patient were reviewed by me and considered in my medical decision making (see chart for details). Patient with nonspecific symptoms, appears anxious with likely viral etiology. We will obtain basic labs and x-rays and reevaluate. ____________________________________________    LABS (pertinent positives/negatives)  Labs Reviewed  BASIC METABOLIC PANEL - Abnormal; Notable for the following:    Potassium 3.4 (*)    Glucose, Bld 111 (*)    All other components within normal limits  CBC - Abnormal; Notable for the following:    WBC 11.4 (*)    MCV 76.7 (*)    RDW 16.2 (*)    All other components within normal limits  TROPONIN I    RADIOLOGY  IMPRESSION: No acute pulmonary process.   ____________________________________________  FINAL ASSESSMENT AND PLAN  Cough, restless leg syndrome  Plan: Patient with labs and imaging as dictated above. At the time my evaluation, she was not tachycardic and O2 saturation is normal. Her lungs are clear, she is not wheezing at this time. Her main complaint to me is her restless legs. She has normal pulses in her legs but the legs hurt diffusely and she can't stop moving them. I have given her Valium and Tussionex for her symptoms. I have a low suspicion for new DVT or PE given her recent workup for same. She stable for outpatient follow-up.   Earleen Newport, MD   Earleen Newport, MD 12/08/15 [phone]

## 2015-12-08 NOTE — ED Notes (Signed)
Pt reports that restlessness in legs has decreased slightly.

## 2015-12-08 NOTE — ED Notes (Signed)
Pt c/o shortness of breath for 2 days - pt c/o bilat leg pain to the point of being weak and unable to stand - Pt reports that bilat legs are swollen - pedal pulses are equal bilat and no pitting edema noted to ext

## 2015-12-08 NOTE — ED Notes (Signed)
Patient ambulatory to triage with steady gait, without difficulty or distress noted; pt reports SHOB x 2 days, nonprod cough; and legs aching

## 2015-12-19 IMAGING — CR DG CHEST 2V
1 series · 2 of 2 positions shown · non-contrast
Comparison: 09/02/2014

CLINICAL DATA: Chest pain, cough and right arm heaviness in the
past 3 days.

EXAM:
CHEST  2 VIEW

[Series 1: w chest pa · 0.14mm/px · 2 of 2 slices shown]
[im 1/2]
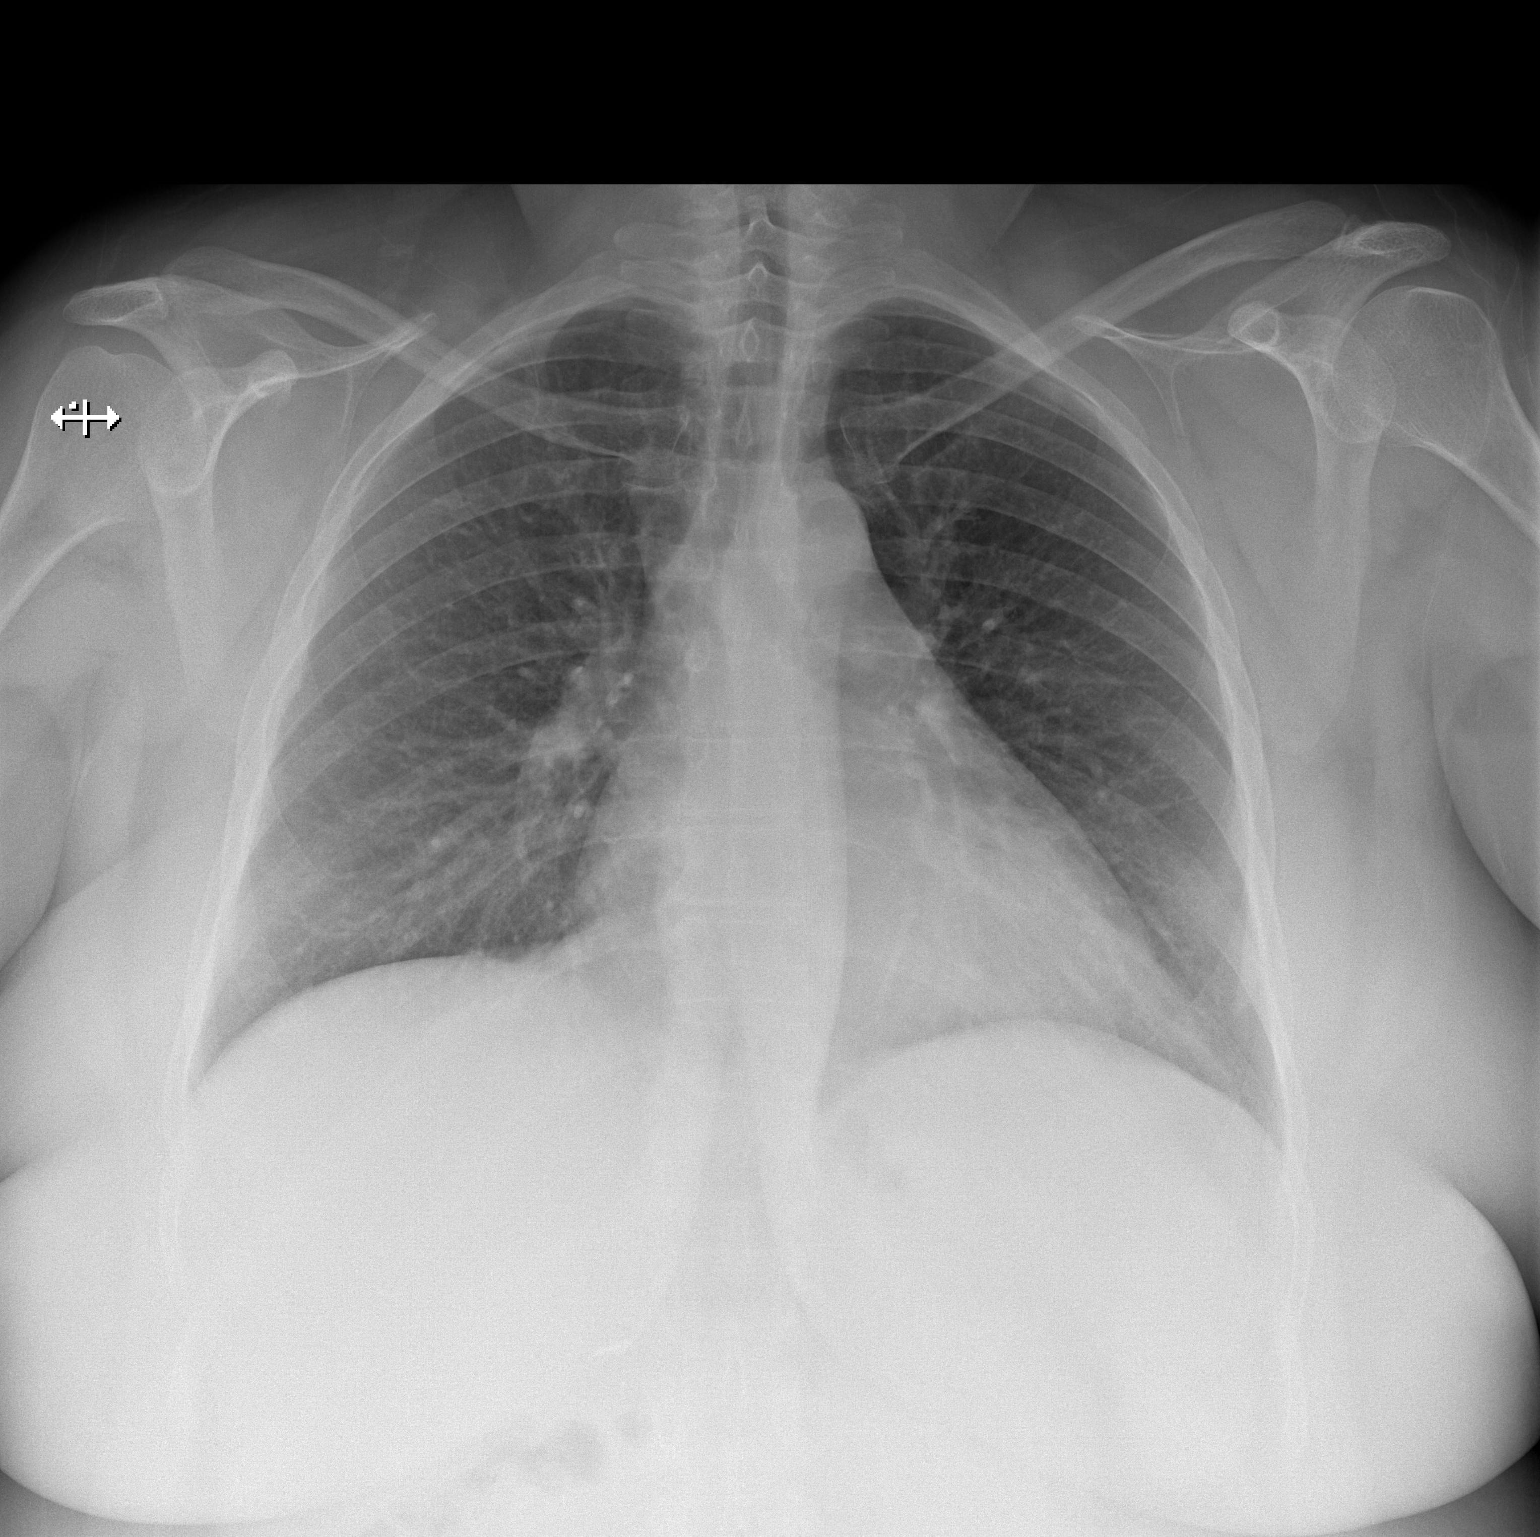
[im 2/2]
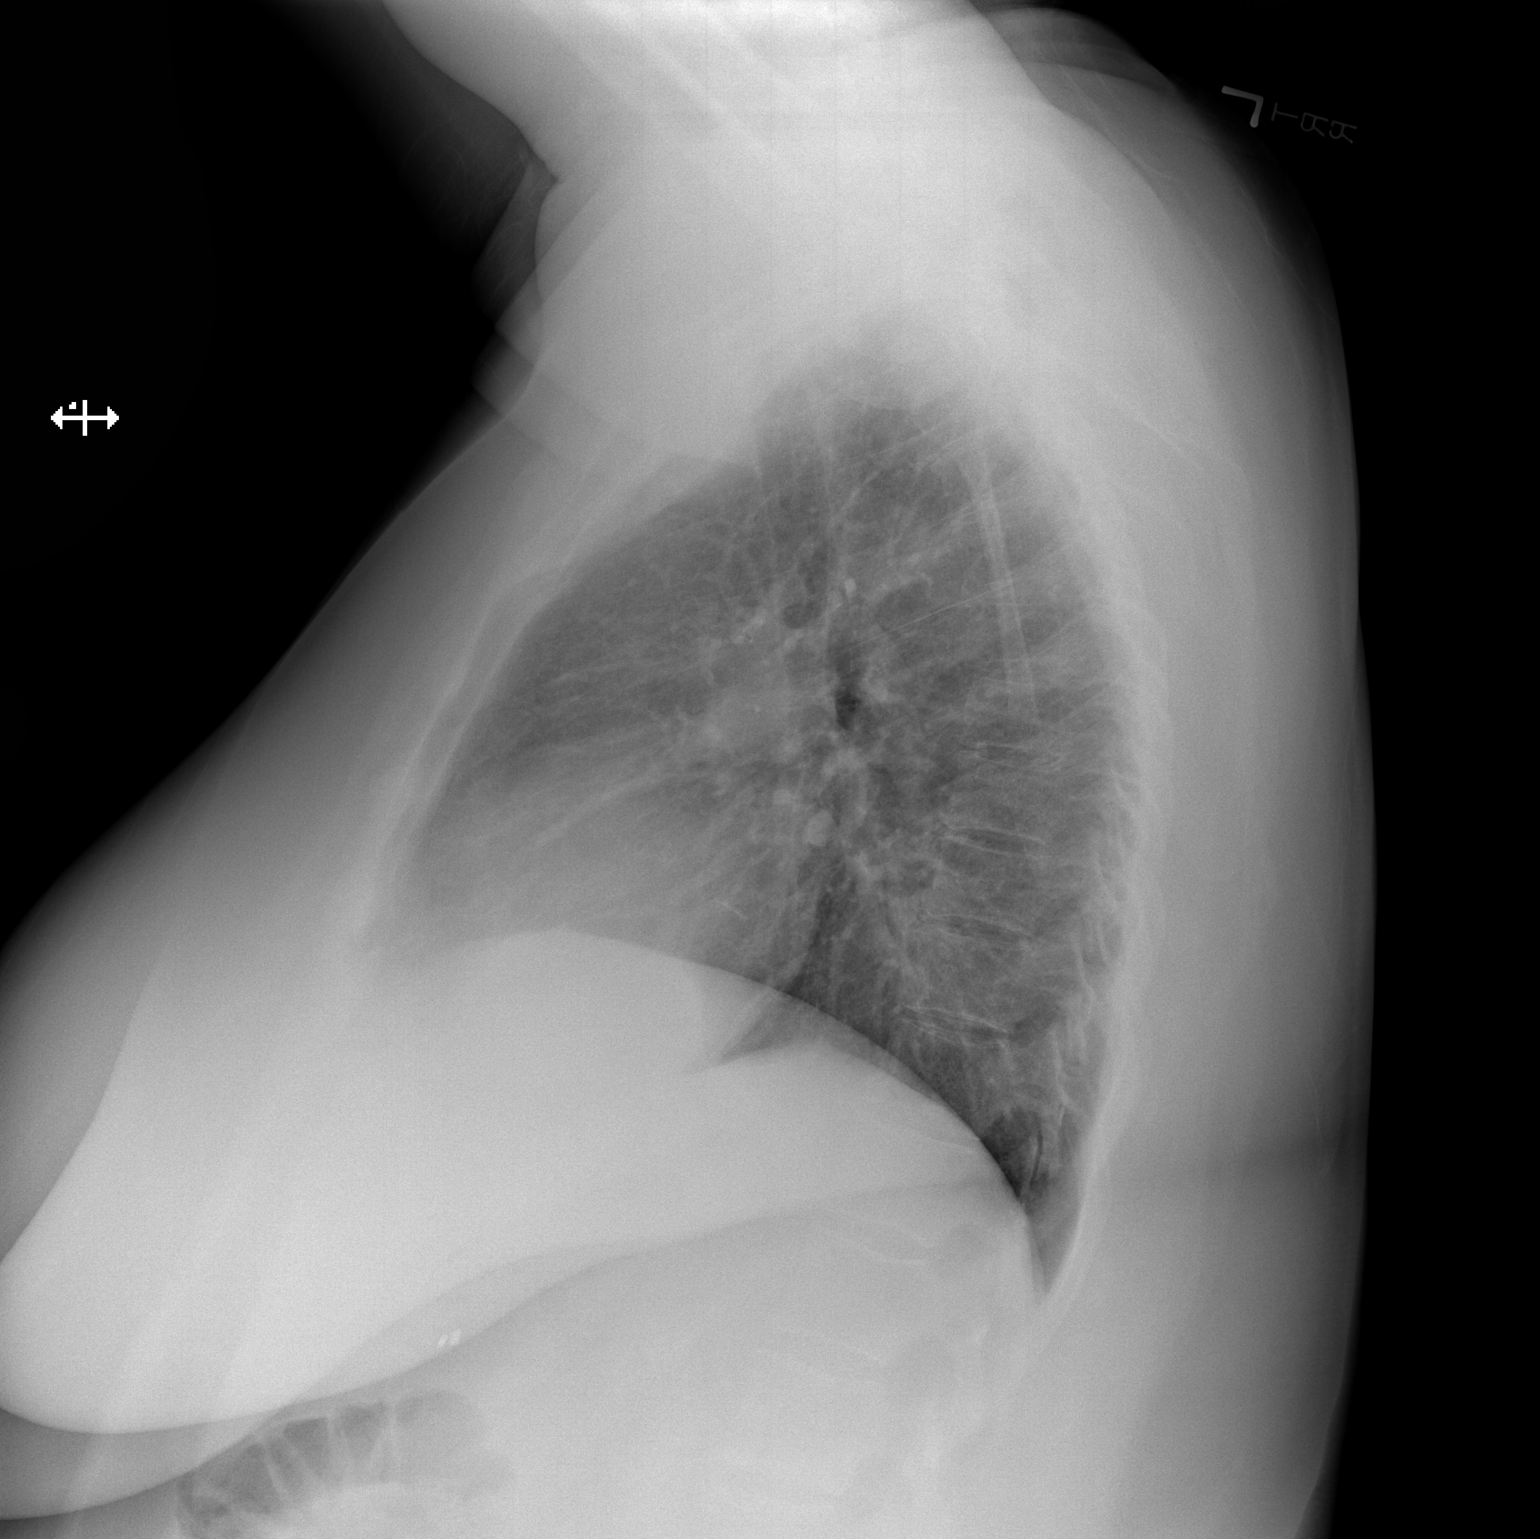

[2 of 2 positions shown; findings below may reference images not displayed]

FINDINGS: The heart size and mediastinal contours are within normal limits.
Both lungs are clear. The visualized skeletal structures are
unremarkable.
IMPRESSION: No active cardiopulmonary disease.

## 2016-01-15 IMAGING — CR DG ELBOW COMPLETE 3+V*L*
1 series · 5 of 5 positions shown · non-contrast
Comparison: None.

CLINICAL DATA: Fall with left elbow pain, initially count

EXAM:
LEFT ELBOW - COMPLETE 3+ VIEW

[Series 1: dxr elbow lt comp w/obliques · 0.14mm/px · 5 of 5 slices shown]
[im 1/5]
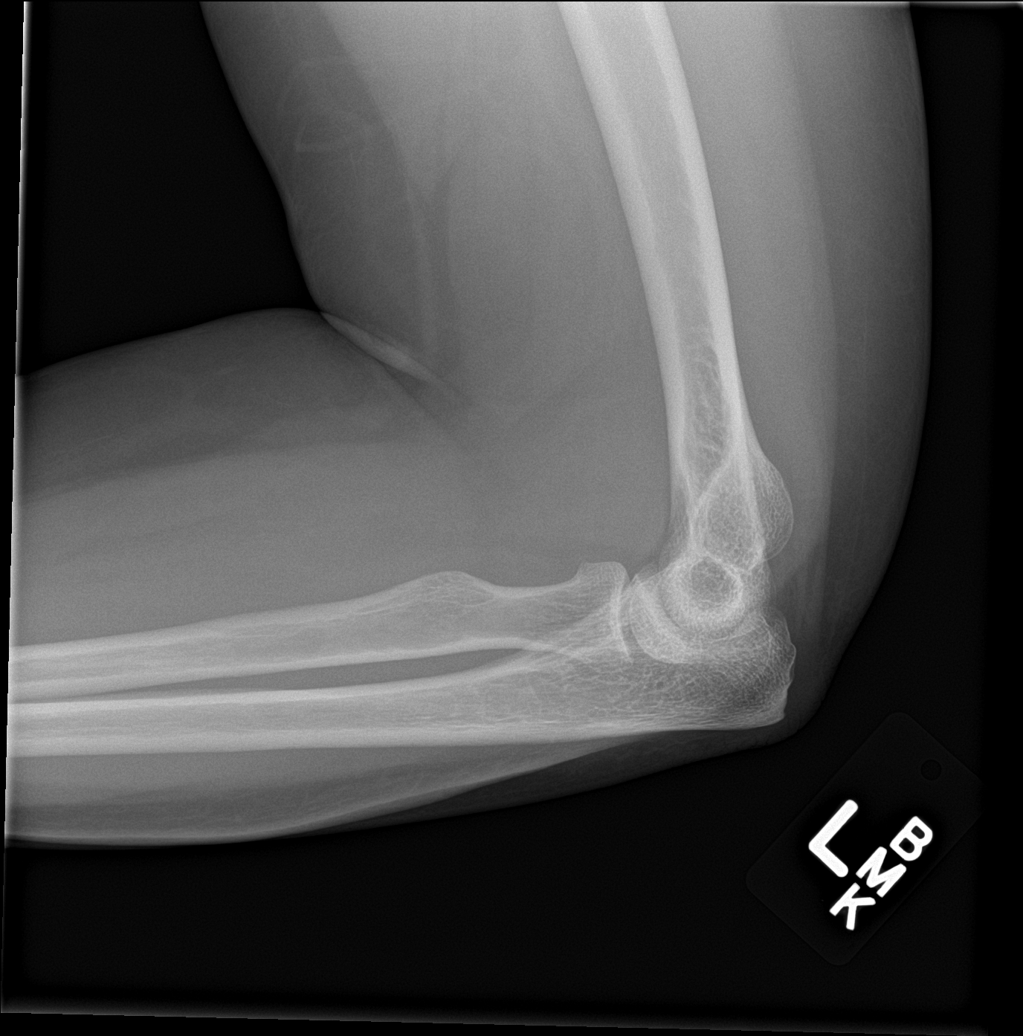
[im 2/5]
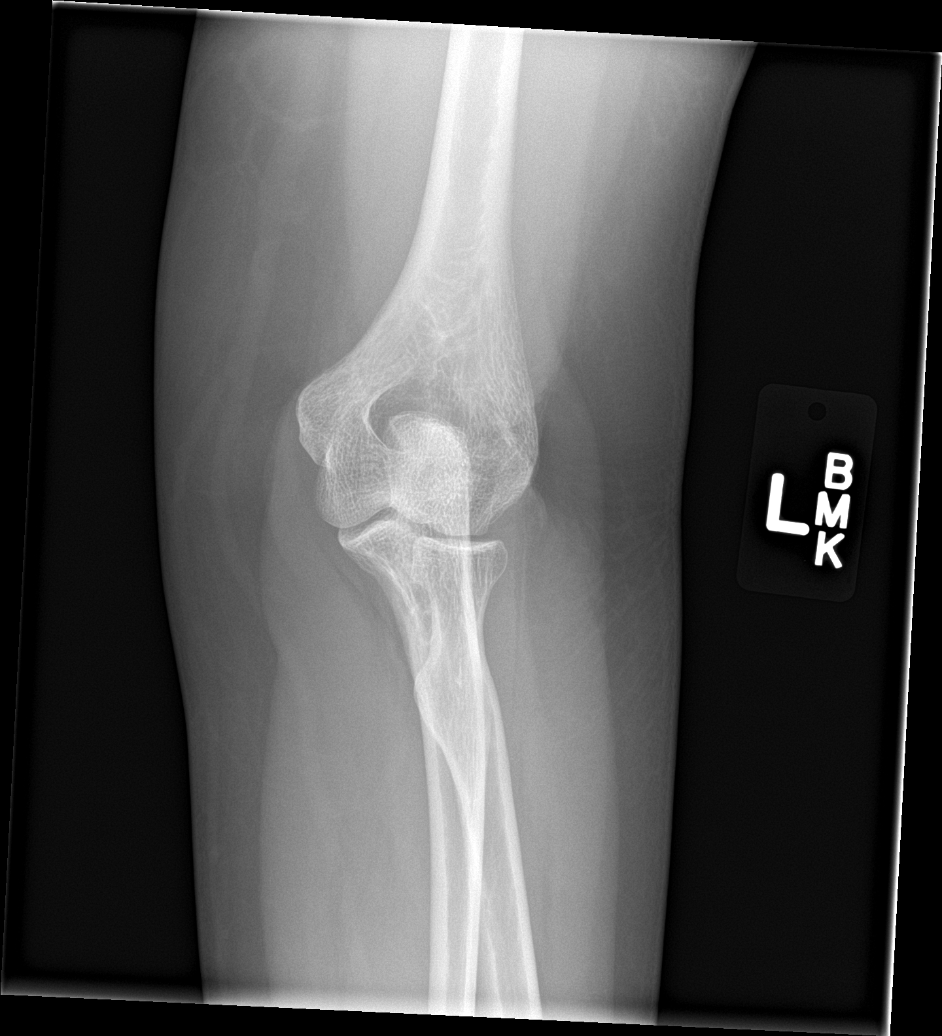
[im 3/5]
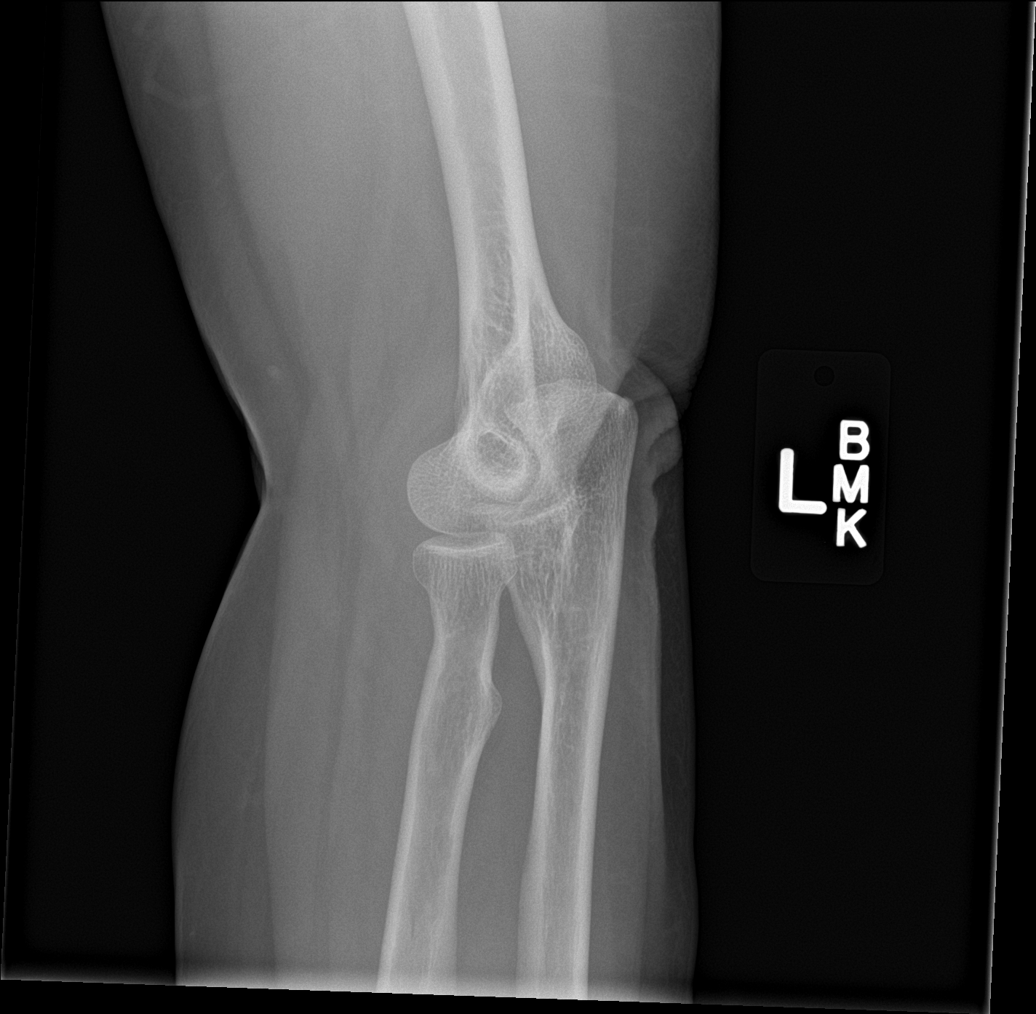
[im 4/5]
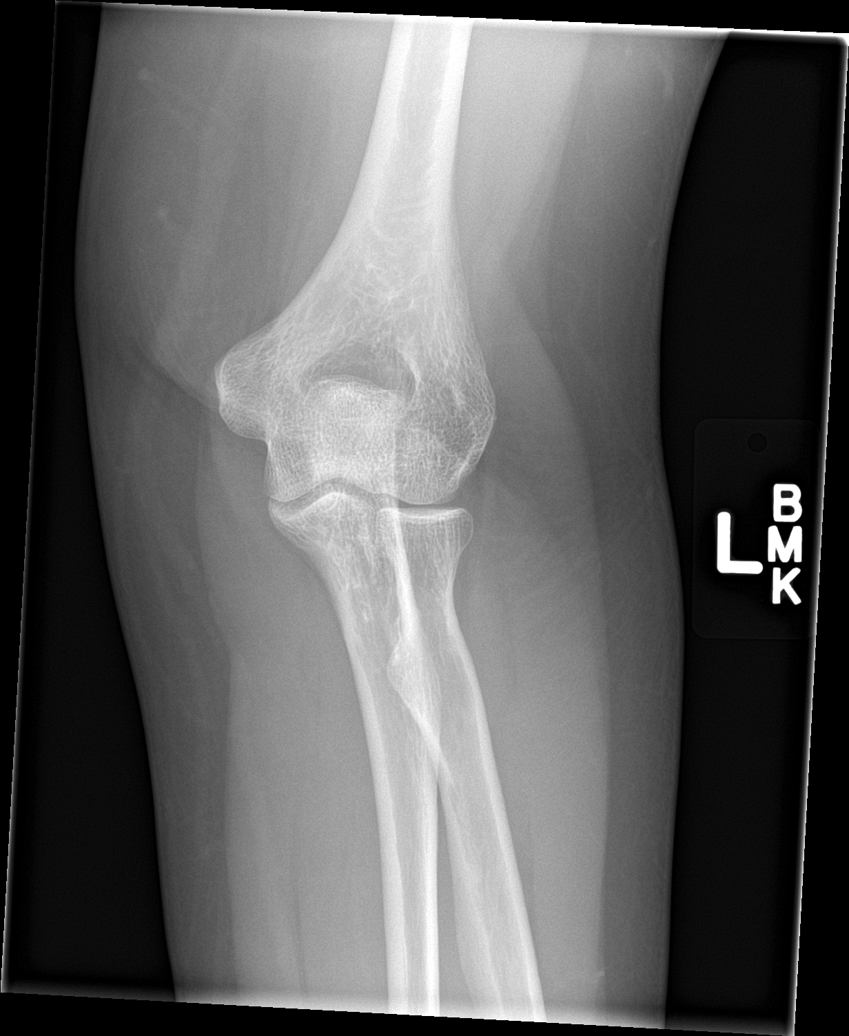
[im 5/5]
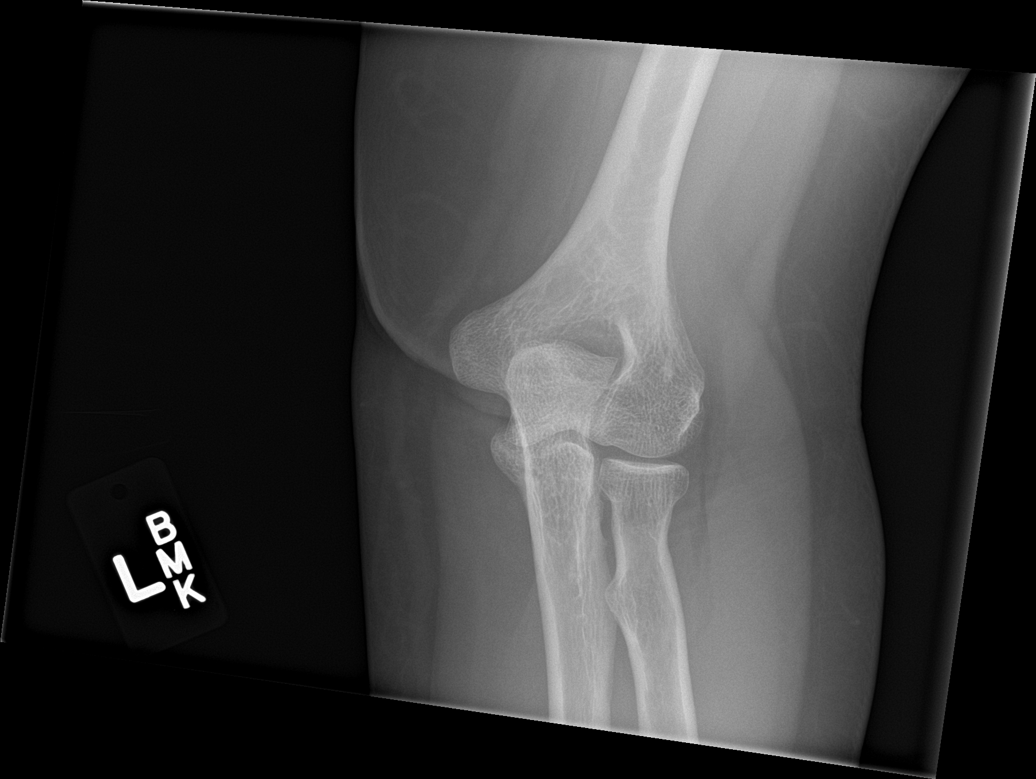

[5 of 5 positions shown; findings below may reference images not displayed]

FINDINGS: There is no evidence of fracture, dislocation, or joint effusion.
There is no evidence of arthropathy or other focal bone abnormality.
Soft tissues are unremarkable.
IMPRESSION: No acute abnormality noted.

## 2016-01-15 IMAGING — CR LEFT WRIST - COMPLETE 3+ VIEW
1 series · 4 of 4 positions shown · non-contrast
Comparison: None.

CLINICAL DATA: Fall on outstretched wrist with popping sound,
initial encounter

EXAM:
LEFT WRIST - COMPLETE 3+ VIEW

[Series 1: dxr wrist lt comp with obliques · 0.14mm/px · 4 of 4 slices shown]
[im 1/4]
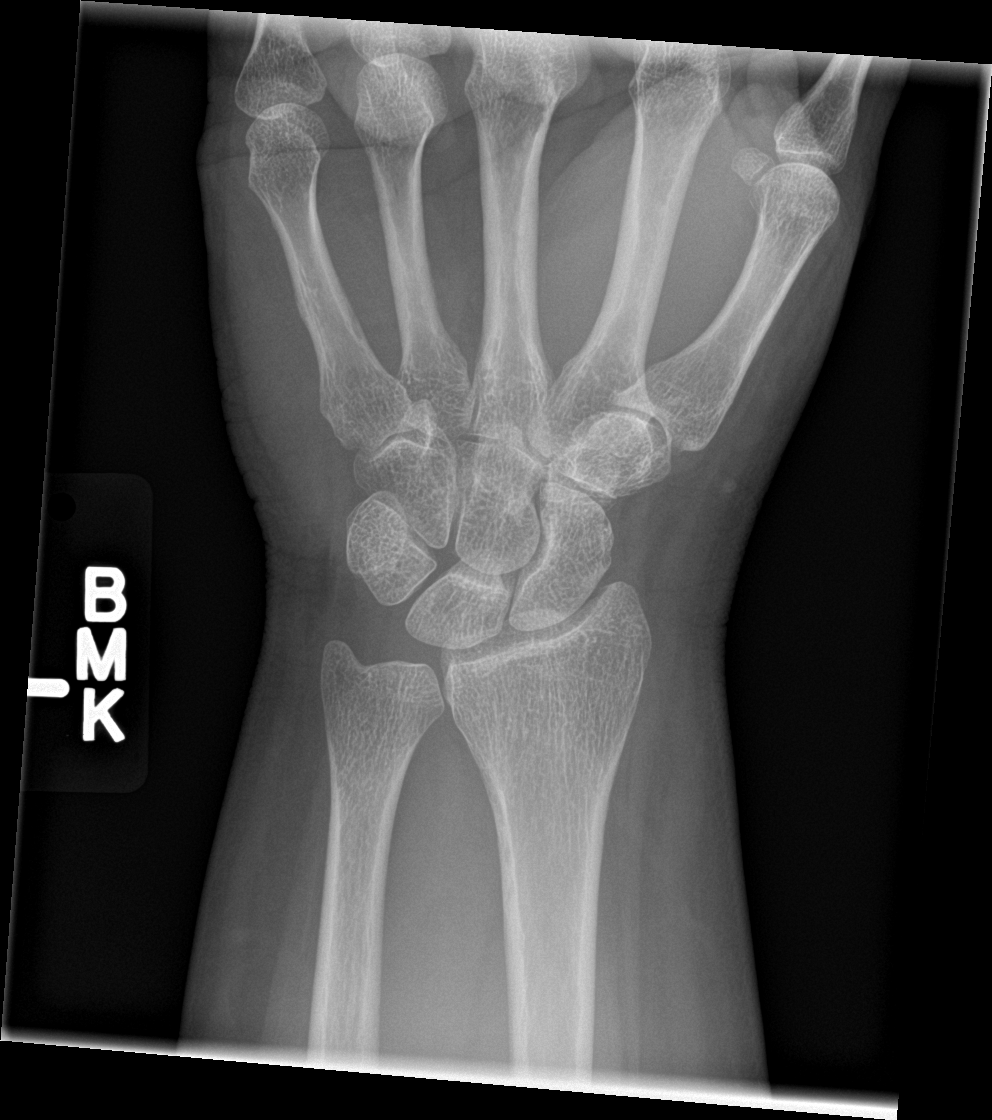
[im 2/4]
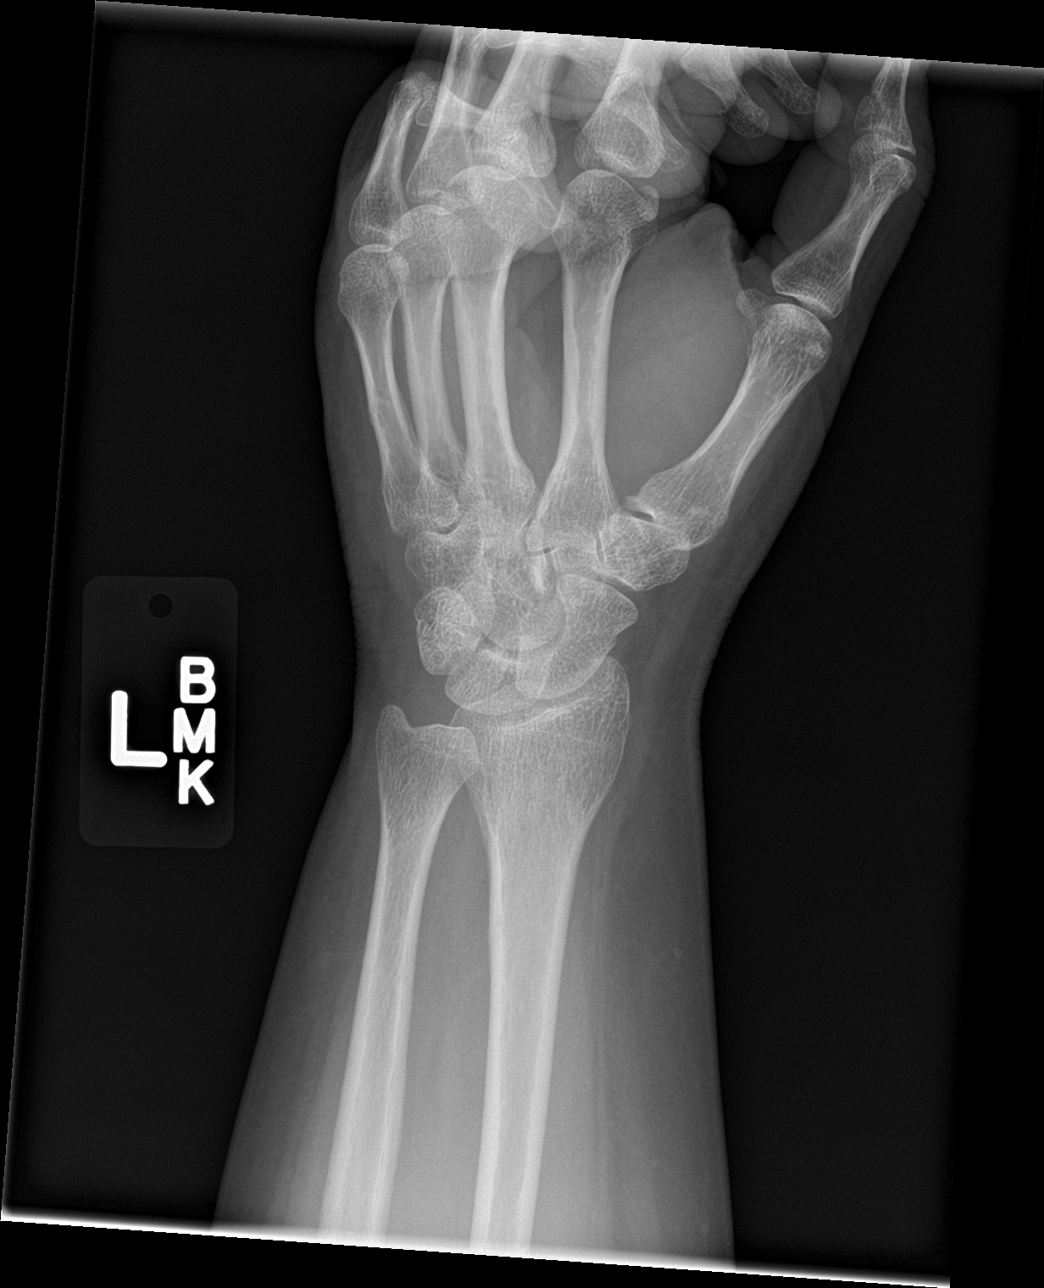
[im 3/4]
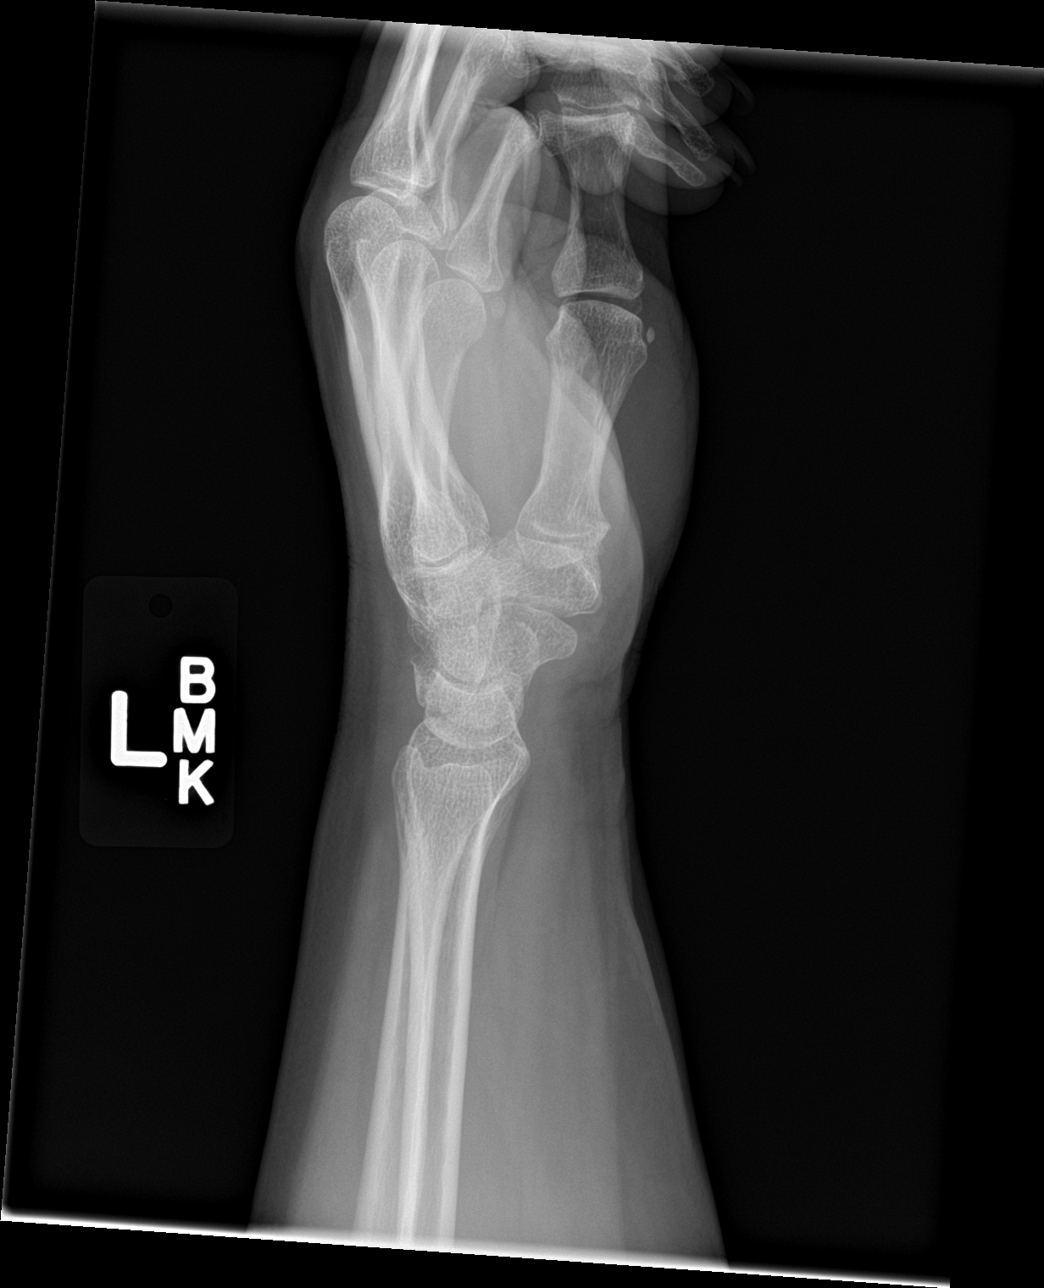
[im 4/4]
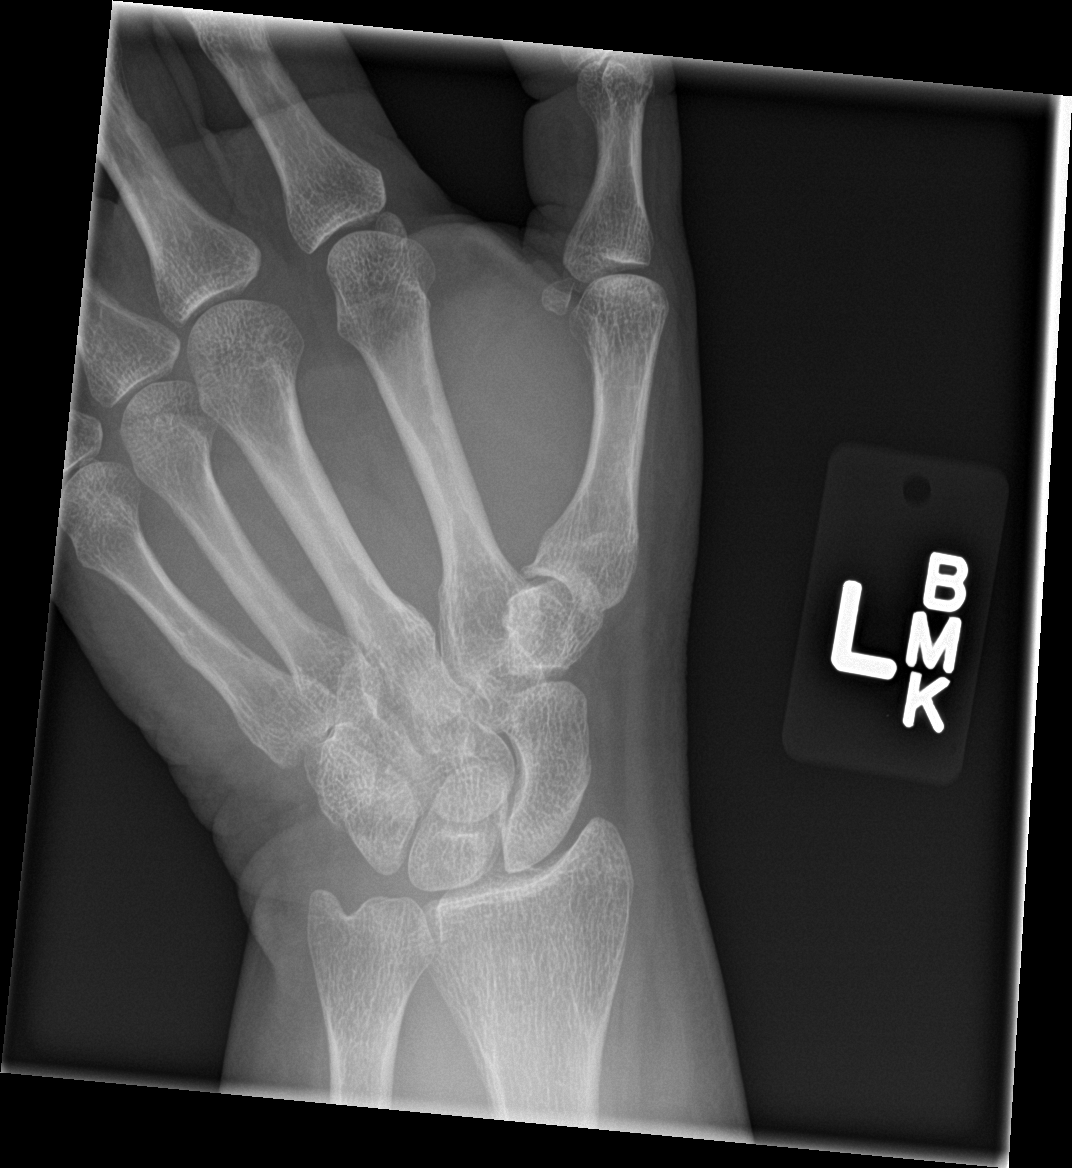

[4 of 4 positions shown; findings below may reference images not displayed]

FINDINGS: There is no evidence of fracture or dislocation. There is no
evidence of arthropathy or other focal bone abnormality. Soft
tissues are unremarkable.
IMPRESSION: No acute abnormality noted.

## 2016-02-01 ENCOUNTER — Emergency Department: Payer: Self-pay

## 2016-02-01 ENCOUNTER — Emergency Department
Admission: EM | Admit: 2016-02-01 | Discharge: 2016-02-01 | Disposition: A | Discharge status: Home / Self Care | Payer: Self-pay | Attending: Emergency Medicine | Admitting: Emergency Medicine

## 2016-02-01 DIAGNOSIS — R197 Diarrhea, unspecified: Secondary | ICD-10-CM | POA: Insufficient documentation

## 2016-02-01 DIAGNOSIS — R110 Nausea: Secondary | ICD-10-CM | POA: Insufficient documentation

## 2016-02-01 DIAGNOSIS — R1032 Left lower quadrant pain: Secondary | ICD-10-CM | POA: Insufficient documentation

## 2016-02-01 DIAGNOSIS — J45909 Unspecified asthma, uncomplicated: Secondary | ICD-10-CM | POA: Insufficient documentation

## 2016-02-01 DIAGNOSIS — I509 Heart failure, unspecified: Secondary | ICD-10-CM | POA: Insufficient documentation

## 2016-02-01 DIAGNOSIS — M545 Low back pain: Secondary | ICD-10-CM | POA: Insufficient documentation

## 2016-02-01 LAB — URINALYSIS COMPLETE WITH MICROSCOPIC (ARMC ONLY)
BILIRUBIN URINE: NEGATIVE
GLUCOSE, UA: NEGATIVE mg/dL
Hgb urine dipstick: NEGATIVE
KETONES UR: NEGATIVE mg/dL
Leukocytes, UA: NEGATIVE
Nitrite: NEGATIVE
PH: 7 (ref 5.0–8.0)
Protein, ur: NEGATIVE mg/dL
Specific Gravity, Urine: 1.006 (ref 1.005–1.030)

## 2016-02-01 LAB — BASIC METABOLIC PANEL
Anion gap: 7 (ref 5–15)
BUN: 13 mg/dL (ref 6–20)
CALCIUM: 9.1 mg/dL (ref 8.9–10.3)
CO2: 27 mmol/L (ref 22–32)
CREATININE: 0.84 mg/dL (ref 0.44–1.00)
Chloride: 104 mmol/L (ref 101–111)
GFR calc Af Amer: >60 mL/min (ref >60)
GLUCOSE: 90 mg/dL (ref 65–99)
Potassium: 3.6 mmol/L (ref 3.5–5.1)
Sodium: 138 mmol/L (ref 135–145)

## 2016-02-01 LAB — LIPASE, BLOOD: Lipase: 29 U/L (ref 11–51)

## 2016-02-01 LAB — CBC
HEMATOCRIT: 37.4 % (ref 35.0–47.0)
Hemoglobin: 12.4 g/dL (ref 12.0–16.0)
MCH: 25.8 pg — ABNORMAL LOW (ref 26.0–34.0)
MCHC: 33 g/dL (ref 32.0–36.0)
MCV: 78.1 fL — AB (ref 80.0–100.0)
PLATELETS: 281 10*3/uL (ref 150–440)
RBC: 4.79 MIL/uL (ref 3.80–5.20)
RDW: 16.4 % — AB (ref 11.5–14.5)
WBC: 9 10*3/uL (ref 3.6–11.0)

## 2016-02-01 MED ORDER — BARIUM SULFATE 2.1 % PO SUSP
450.0000 mL | ORAL | Status: DC
  Filled 2016-02-01 (×2): qty 450

## 2016-02-01 MED ORDER — METRONIDAZOLE 500 MG PO TABS: 500.0000 mg | ORAL_TABLET | Freq: Three times a day (TID) | ORAL | Status: AC

## 2016-02-01 MED ORDER — FENTANYL CITRATE (PF) 100 MCG/2ML IJ SOLN
INTRAMUSCULAR | Status: AC
  Filled 2016-02-01: qty 2

## 2016-02-01 MED ORDER — METRONIDAZOLE 500 MG PO TABS
500.0000 mg | ORAL_TABLET | Freq: Once | ORAL | Status: AC
  Administered 2016-02-01: 500 mg via ORAL
  Filled 2016-02-01 (×2): qty 1

## 2016-02-01 MED ORDER — CIPROFLOXACIN HCL 500 MG PO TABS
500.0000 mg | ORAL_TABLET | Freq: Once | ORAL | Status: AC
  Administered 2016-02-01: 500 mg via ORAL
  Filled 2016-02-01 (×2): qty 1

## 2016-02-01 MED ORDER — SODIUM CHLORIDE 0.9 % IV BOLUS (SEPSIS)
1000.0000 mL | Freq: Once | INTRAVENOUS | Status: AC
  Administered 2016-02-01: 1000 mL via INTRAVENOUS

## 2016-02-01 MED ORDER — METOCLOPRAMIDE HCL 5 MG/ML IJ SOLN
10.0000 mg | Freq: Once | INTRAMUSCULAR | Status: AC
  Administered 2016-02-01: 10 mg via INTRAVENOUS
  Filled 2016-02-01: qty 2

## 2016-02-01 MED ORDER — FENTANYL CITRATE (PF) 100 MCG/2ML IJ SOLN
50.0000 ug | Freq: Once | INTRAMUSCULAR | Status: AC
  Administered 2016-02-01: 50 ug via INTRAVENOUS

## 2016-02-01 MED ORDER — CIPROFLOXACIN HCL 500 MG PO TABS: 500.0000 mg | ORAL_TABLET | Freq: Two times a day (BID) | ORAL | Status: AC

## 2016-02-01 NOTE — ED Provider Notes (Signed)
[facility] Emergency Department Provider Note   ____________________________________________  Time seen: Approximately 8:23 PM  I have reviewed the triage vital signs and the nursing notes.   HISTORY  Chief Complaint Back Pain   HPI Brittany Nguyen is a 50 y.o. female with a history of kidney stones as well as diverticulitis who is presenting to the emergency department today with left lower back pain that has been intermittent since this past Friday. She describes it as sharp and an 8 out of 10 at this time. It is radiating around to the front of her abdomen and down into her pelvis. She's had decreased urination as well as 6-7 episodes of diarrhea per day which she describes as green. She denies taking any antibiotics recently. No known sick contacts. Michela Pitcher that she had very similar symptoms when she had a kidney stone in the past.She said that her history of a DVT was only a presumed DVT and she no longer takes anticoagulation. Has been nauseous but without any vomiting.  Past Medical History  Diagnosis Date  . Hx MRSA infection   . Asthma 2013  . Heart disease 2013  . Diverticulitis 2010  . Lump or mass in breast   . Endometriosis 1990  . Restless leg   . CHF (congestive heart failure) ([place])   . DVT (deep venous thrombosis) [facility])     Patient Active Problem List   Diagnosis Date Noted  . Acute bronchitis 11/22/2015  . Cough, persistent 11/22/2015  . Hypokalemia 11/22/2015  . Leukocytosis 11/22/2015  . Dyspnea 11/21/2015  . Respiratory distress 09/19/2015  . Breast pain 01/11/2013  . Hx MRSA infection   . Lump or mass in breast   . GOITER, MULTINODULAR 01/20/2009  . HYPOGLYCEMIA, UNSPECIFIED 01/20/2009  . GERD 01/20/2009  . DIVERTICULITIS OF COLON 01/20/2009    Past Surgical History  Procedure Laterality Date  . Abdominal hysterectomy      age 44  . Nasal sinus surgery  2012  . Colectomy    . Cholecystectomy    . Cesarean  section      Current Outpatient Rx  Name  Route  Sig  Dispense  Refill  . albuterol (PROVENTIL HFA;VENTOLIN HFA) 108 (90 Base) MCG/ACT inhaler   Inhalation   Inhale 2 puffs into the lungs every 6 (six) hours as needed for wheezing or shortness of breath.   1 Inhaler   0   . albuterol (PROVENTIL) (2.5 MG/3ML) 0.083% nebulizer solution   Nebulization   Take 3 mLs (2.5 mg total) by nebulization every 6 (six) hours.   75 mL   1   . azithromycin (ZITHROMAX Z-PAK) 250 MG tablet      Take as directed   6 each   0   . chlorpheniramine-HYDROcodone (TUSSIONEX PENNKINETIC ER) 10-8 MG/5ML SUER   Oral   Take 5 mLs by mouth 2 (two) times daily.   140 mL   0   . chlorpheniramine-HYDROcodone (TUSSIONEX) 10-8 MG/5ML SUER   Oral   Take 5 mLs by mouth every 12 (twelve) hours.   140 mL   0   . codeine 30 MG tablet   Oral   Take 1 tablet (30 mg total) by mouth every 6 (six) hours as needed for moderate pain.   30 tablet   0   . diazepam (VALIUM) 5 MG tablet   Oral   Take 1 tablet (5 mg total) by mouth every 8 (eight) hours as needed for muscle  spasms.   20 tablet   0   . fluticasone (FLOVENT HFA) 220 MCG/ACT inhaler   Inhalation   Inhale 1 puff into the lungs 2 (two) times daily.   1 Inhaler   12   . guaiFENesin (MUCINEX) 600 MG 12 hr tablet   Oral   Take 1 tablet (600 mg total) by mouth 2 (two) times daily.   20 tablet   0   . predniSONE (DELTASONE) 20 MG tablet      2 tabs daily for 5 days Patient not taking: Reported on 11/21/2015   10 tablet   0   . rOPINIRole (REQUIP) 2 MG tablet   Oral   Take 1 tablet (2 mg total) by mouth at bedtime. Patient taking differently: Take 4 mg by mouth at bedtime.    30 tablet   0     Allergies Contrast media; Latex; Ondansetron; Penicillins; and Povidone-iodine  Family History  Problem Relation Age of Onset  . Cancer Mother 30    ovarian  . Cancer Father 30    brain  . Cancer Daughter 18    skin  . Cancer Maternal  Aunt 82    breast    Social History Social History  Substance Use Topics  . Smoking status: Never Smoker   . Smokeless tobacco: Never Used  . Alcohol Use: No    Review of Systems Constitutional: No fever/chills Eyes: No visual changes. ENT: No sore throat. Cardiovascular: Denies chest pain. Respiratory: Denies shortness of breath. Gastrointestinal:   No constipation. Genitourinary: Negative for dysuria. Musculoskeletal: Cramping left lower back pain.  Skin: Negative for rash. Neurological: Negative for headaches, focal weakness or numbness.  10-point ROS otherwise negative.  ____________________________________________   PHYSICAL EXAM:  VITAL SIGNS: ED Triage Vitals  Enc Vitals Group     BP 02/01/16 1943 112/75 mmHg     Pulse Rate 02/01/16 1943 98     Resp 02/01/16 1943 18     Temp 02/01/16 1943 98.4 F (36.9 C)     Temp Source 02/01/16 1943 Oral     SpO2 02/01/16 1943 100 %     Weight 02/01/16 1943 200 lb (90.719 kg)     Height 02/01/16 1943 5\' 5"  (1.651 m)     Head Cir --      Peak Flow --      Pain Score 02/01/16 1948 8     Pain Loc --      Pain Edu? --      Excl. in [place]? --     Constitutional: Alert and oriented. Appears uncomfortable. Eyes: Conjunctivae are normal. PERRL. EOMI. Head: Atraumatic. Nose: No congestion/rhinnorhea. Mouth/Throat: Mucous membranes are moist.  Neck: No stridor.   Cardiovascular: Normal rate, regular rhythm. Grossly normal heart sounds.   Respiratory: Normal respiratory effort.  No retractions. Lungs CTAB. Gastrointestinal: Soft With left upper and left lower quadrant tenderness palpation. No rebound or guarding. Left lower lumbar tenderness. Musculoskeletal: No lower extremity tenderness nor edema.  No joint effusions. Neurologic:  Normal speech and language. No gross focal neurologic deficits are appreciated Skin:  Skin is warm, dry and intact. No rash noted. Psychiatric: Mood and affect are normal. Speech and behavior are  normal.  ____________________________________________   LABS (all labs ordered are listed, but only abnormal results are displayed)  Labs Reviewed  CBC - Abnormal; Notable for the following:    MCV 78.1 (*)    MCH 25.8 (*)    RDW 16.4 (*)  All other components within normal limits  BASIC METABOLIC PANEL  URINALYSIS COMPLETEWITH MICROSCOPIC ([facility] ONLY)  LIPASE, BLOOD   ____________________________________________  EKG   ____________________________________________  RADIOLOGY   CT Abdomen Pelvis Wo Contrast (Final result) Result time: 02/01/16 21:55:38   Final result by Rad Results In Interface (02/01/16 21:55:38)   Narrative:   CLINICAL DATA: Left-sided abdominal pain for 5 days. Diarrhea and vomiting. History of kidney stones and diverticulitis.  EXAM: CT ABDOMEN AND PELVIS WITHOUT CONTRAST  TECHNIQUE: Multidetector CT imaging of the abdomen and pelvis was performed following the standard protocol without IV contrast. No contrast administered due to reported allergy.  COMPARISON: Most recent CT 09/07/2015  FINDINGS: Lower chest: The included lung bases are clear. Subpleural scarring in the right lower lobe.  Liver: Mild steatosis. No evidence of focal lesion allowing for lack contrast.  Hepatobiliary: Clips in the gallbladder fossa postcholecystectomy. No biliary dilatation.  Pancreas: No ductal dilatation or inflammation.  Spleen: Normal.  Adrenal glands: No nodule.  Kidneys: Extrarenal pelvis configuration of the left kidney. Kidneys are symmetric in size without stones or hydronephrosis. There is no perinephric stranding. Both ureters are decompressed without stones along the course.  Stomach/Bowel: Stomach is decompressed. There are no dilated or thickened small bowel loops. Small volume of stool throughout the colon without colonic wall thickening. Scattered colonic diverticula. Enteric chain sutures in the sigmoid colon. The appendix  is not visualized, surgically absent per report.  Vascular/Lymphatic: No retroperitoneal adenopathy. Abdominal aorta is normal in caliber.  Reproductive: The uterus is surgically absent no adnexal mass.  Bladder: Decompressed, no bladder stone.  Other: No free air, free fluid, or intra-abdominal fluid collection.  Musculoskeletal: There are no acute or suspicious osseous abnormalities.  IMPRESSION: No acute abnormality in the abdomen/pelvis.  No renal stones or obstructive uropathy.  Minimal diverticulosis without diverticulitis.   Electronically Signed By: Jeb Levering M.D. On: 02/01/2016 21:55       ____________________________________________   PROCEDURES   ____________________________________________   INITIAL IMPRESSION / ASSESSMENT AND PLAN / ED COURSE  Pertinent labs & imaging results that were available during my care of the patient were reviewed by me and considered in my medical decision making (see chart for details).  ----------------------------------------- 10:37 PM on 02/01/2016 -----------------------------------------  Patient with improvement in her pain. Resting comfortably at this time. Has had several other episodes of diarrhea while in the emergency department. Labs as well as imaging are very benign. However, the patient says that she has had flares of diverticulitis in the past that it felt identical. We'll prescribe Cipro and Flagyl for the patient. She will continue with improvement as well as Tylenol and heating pad at home for pain. She'll be following up with her primary care doctor, Dr. Kary Kos. Patient understands the plan and willing to comply. ____________________________________________   FINAL CLINICAL IMPRESSION(S) / ED DIAGNOSES  Abdominal pain with nausea and diarrhea. Empiric treatment for diverticulitis.    NEW MEDICATIONS STARTED DURING THIS VISIT:  New Prescriptions   No medications on file     Note:   This document was prepared using Dragon voice recognition software and may include unintentional dictation errors.    Orbie Pyo, MD 02/01/16 [phone]

## 2016-02-01 NOTE — Discharge Instructions (Signed)
Abdominal Pain, Adult Many things can cause belly (abdominal) pain. Most times, the belly pain is not dangerous. Many cases of belly pain can be watched and treated at home. HOME CARE   Do not take medicines that help you go poop (laxatives) unless told to by your doctor.  Only take medicine as told by your doctor.  Eat or drink as told by your doctor. Your doctor will tell you if you should be on a special diet. GET HELP IF:  You do not know what is causing your belly pain.  You have belly pain while you are sick to your stomach (nauseous) or have runny poop (diarrhea).  You have pain while you pee or poop.  Your belly pain wakes you up at night.  You have belly pain that gets worse or better when you eat.  You have belly pain that gets worse when you eat fatty foods.  You have a fever. GET HELP RIGHT AWAY IF:   The pain does not go away within 2 hours.  You keep throwing up (vomiting).  The pain changes and is only in the right or left part of the belly.  You have bloody or tarry looking poop. MAKE SURE YOU:   Understand these instructions.  Will watch your condition.  Will get help right away if you are not doing well or get worse.   This information is not intended to replace advice given to you by your health care provider. Make sure you discuss any questions you have with your health care provider.   Document Released: 02/08/2008 Document Revised: 09/12/2014 Document Reviewed: 05/01/2013 Elsevier Interactive Patient Education 2016 [place].  Diarrhea Diarrhea is frequent loose and watery bowel movements. It can cause you to feel weak and dehydrated. Dehydration can cause you to become tired and thirsty, have a dry mouth, and have decreased urination that often is dark yellow. Diarrhea is a sign of another problem, most often an infection that will not last long. In most cases, diarrhea typically lasts 2-3 days. However, it can last longer if it is a sign of  something more serious. It is important to treat your diarrhea as directed by your caregiver to lessen or prevent future episodes of diarrhea. CAUSES  Some common causes include:  Gastrointestinal infections caused by viruses, bacteria, or parasites.  Food poisoning or food allergies.  Certain medicines, such as antibiotics, chemotherapy, and laxatives.  Artificial sweeteners and fructose.  Digestive disorders. HOME CARE INSTRUCTIONS  Ensure adequate fluid intake (hydration): Have 1 cup (8 oz) of fluid for each diarrhea episode. Avoid fluids that contain simple sugars or sports drinks, fruit juices, whole milk products, and sodas. Your urine should be clear or pale yellow if you are drinking enough fluids. Hydrate with an oral rehydration solution that you can purchase at pharmacies, retail stores, and online. You can prepare an oral rehydration solution at home by mixing the following ingredients together:   - tsp table salt.   tsp baking soda.   tsp salt substitute containing potassium chloride.  1  tablespoons sugar.  1 L (34 oz) of water.  Certain foods and beverages may increase the speed at which food moves through the gastrointestinal (GI) tract. These foods and beverages should be avoided and include:  Caffeinated and alcoholic beverages.  High-fiber foods, such as raw fruits and vegetables, nuts, seeds, and whole grain breads and cereals.  Foods and beverages sweetened with sugar alcohols, such as xylitol, sorbitol, and mannitol.  Some  foods may be well tolerated and may help thicken stool including:  Starchy foods, such as rice, toast, pasta, low-sugar cereal, oatmeal, grits, baked potatoes, crackers, and bagels.  Bananas.  Applesauce.  Add probiotic-rich foods to help increase healthy bacteria in the GI tract, such as yogurt and fermented milk products.  Wash your hands well after each diarrhea episode.  Only take over-the-counter or prescription medicines  as directed by your caregiver.  Take a warm bath to relieve any burning or pain from frequent diarrhea episodes. SEEK IMMEDIATE MEDICAL CARE IF:   You are unable to keep fluids down.  You have persistent vomiting.  You have blood in your stool, or your stools are black and tarry.  You do not urinate in 6-8 hours, or there is only a small amount of very dark urine.  You have abdominal pain that increases or localizes.  You have weakness, dizziness, confusion, or light-headedness.  You have a severe headache.  Your diarrhea gets worse or does not get better.  You have a fever or persistent symptoms for more than 2-3 days.  You have a fever and your symptoms suddenly get worse. MAKE SURE YOU:   Understand these instructions.  Will watch your condition.  Will get help right away if you are not doing well or get worse.   This information is not intended to replace advice given to you by your health care provider. Make sure you discuss any questions you have with your health care provider.   Document Released: 08/12/2002 Document Revised: 09/12/2014 Document Reviewed: 04/29/2012 Elsevier Interactive Patient Education 2016 [employer]  Nausea, Adult Nausea means you feel sick to your stomach or need to throw up (vomit). It may be a sign of a more serious problem. If nausea gets worse, you may throw up. If you throw up a lot, you may lose too much body fluid (dehydration). HOME CARE   Get plenty of rest.  Ask your doctor how to replace body fluid losses (rehydrate).  Eat small amounts of food. Sip liquids more often.  Take all medicines as told by your doctor. GET HELP RIGHT AWAY IF:  You have a fever.  You pass out (faint).  You keep throwing up or have blood in your throw up.  You are very weak, have dry lips or a dry mouth, or you are very thirsty (dehydrated).  You have dark or bloody poop (stool).  You have very bad chest or belly (abdominal) pain.  You  do not get better after 2 days, or you get worse.  You have a headache. MAKE SURE YOU:  Understand these instructions.  Will watch your condition.  Will get help right away if you are not doing well or get worse.   This information is not intended to replace advice given to you by your health care provider. Make sure you discuss any questions you have with your health care provider.   Document Released: 08/11/2011 Document Revised: 11/14/2011 Document Reviewed: 08/11/2011 Elsevier Interactive Patient Education [employer].

## 2016-02-01 NOTE — ED Notes (Signed)
Presents to ED via POV from home with chief complaint of back pain that radiates around into left lower quadrant. Has had loose stools that are mucousy and green, nausea without vomiting and dysuria, urgency and frequency.

## 2016-02-10 ENCOUNTER — Emergency Department: Payer: Self-pay

## 2016-02-10 ENCOUNTER — Emergency Department
Admission: EM | Admit: 2016-02-10 | Discharge: 2016-02-10 | Disposition: A | Discharge status: Home / Self Care | Payer: Self-pay | Attending: Emergency Medicine | Admitting: Emergency Medicine

## 2016-02-10 DIAGNOSIS — Y929 Unspecified place or not applicable: Secondary | ICD-10-CM | POA: Insufficient documentation

## 2016-02-10 DIAGNOSIS — J45909 Unspecified asthma, uncomplicated: Secondary | ICD-10-CM | POA: Insufficient documentation

## 2016-02-10 DIAGNOSIS — Y9302 Activity, running: Secondary | ICD-10-CM | POA: Insufficient documentation

## 2016-02-10 DIAGNOSIS — Y990 Civilian activity done for income or pay: Secondary | ICD-10-CM | POA: Insufficient documentation

## 2016-02-10 DIAGNOSIS — S5011XA Contusion of right forearm, initial encounter: Secondary | ICD-10-CM | POA: Insufficient documentation

## 2016-02-10 DIAGNOSIS — X58XXXA Exposure to other specified factors, initial encounter: Secondary | ICD-10-CM | POA: Insufficient documentation

## 2016-02-10 DIAGNOSIS — I509 Heart failure, unspecified: Secondary | ICD-10-CM | POA: Insufficient documentation

## 2016-02-10 MED ORDER — NAPROXEN 500 MG PO TABS
500.0000 mg | ORAL_TABLET | Freq: Once | ORAL | Status: AC
  Administered 2016-02-10: 500 mg via ORAL
  Filled 2016-02-10: qty 1

## 2016-02-10 MED ORDER — OXYCODONE-ACETAMINOPHEN 5-325 MG PO TABS
1.0000 | ORAL_TABLET | Freq: Once | ORAL | Status: AC
  Administered 2016-02-10: 1 via ORAL
  Filled 2016-02-10: qty 1

## 2016-02-10 MED ORDER — TRAMADOL HCL 50 MG PO TABS: 50.0000 mg | ORAL_TABLET | Freq: Four times a day (QID) | ORAL | Status: DC | PRN

## 2016-02-10 MED ORDER — NAPROXEN 500 MG PO TABS: 500.0000 mg | ORAL_TABLET | Freq: Two times a day (BID) | ORAL | Status: DC

## 2016-02-10 NOTE — ED Notes (Signed)
Discharge instructions reviewed with patient. Patient verbalized understanding. Patient ambulated to lobby without difficulty.   

## 2016-02-10 NOTE — ED Provider Notes (Signed)
[facility] Emergency Department Provider Note   ____________________________________________  Time seen: Approximately 5:58 PM  I have reviewed the triage vital signs and the nursing notes.   HISTORY  Chief Complaint Arm Pain    HPI Brittany Nguyen is a 50 y.o. female patient complain of right forearm pain secondary to running into a door at work. He stated pain increases with extension of her right arm. Patient noticing edema to the right forearm. No palliative measures taken since incident. He is right-hand dominant. Patient rates the pain as a 7/10. Patient described a pain as "sharp".   Past Medical History  Diagnosis Date  . Hx MRSA infection   . Asthma 2013  . Heart disease 2013  . Diverticulitis 2010  . Lump or mass in breast   . Endometriosis 1990  . Restless leg   . CHF (congestive heart failure) ([place])   . DVT (deep venous thrombosis) [facility])     Patient Active Problem List   Diagnosis Date Noted  . Acute bronchitis 11/22/2015  . Cough, persistent 11/22/2015  . Hypokalemia 11/22/2015  . Leukocytosis 11/22/2015  . Dyspnea 11/21/2015  . Respiratory distress 09/19/2015  . Breast pain 01/11/2013  . Hx MRSA infection   . Lump or mass in breast   . GOITER, MULTINODULAR 01/20/2009  . HYPOGLYCEMIA, UNSPECIFIED 01/20/2009  . GERD 01/20/2009  . DIVERTICULITIS OF COLON 01/20/2009    Past Surgical History  Procedure Laterality Date  . Abdominal hysterectomy      age 76  . Nasal sinus surgery  2012  . Colectomy    . Cholecystectomy    . Cesarean section      Current Outpatient Rx  Name  Route  Sig  Dispense  Refill  . albuterol (PROVENTIL HFA;VENTOLIN HFA) 108 (90 Base) MCG/ACT inhaler   Inhalation   Inhale 2 puffs into the lungs every 6 (six) hours as needed for wheezing or shortness of breath.   1 Inhaler   0   . albuterol (PROVENTIL) (2.5 MG/3ML) 0.083% nebulizer solution   Nebulization   Take 3 mLs (2.5 mg total) by  nebulization every 6 (six) hours.   75 mL   1   . azithromycin (ZITHROMAX Z-PAK) 250 MG tablet      Take as directed   6 each   0   . chlorpheniramine-HYDROcodone (TUSSIONEX PENNKINETIC ER) 10-8 MG/5ML SUER   Oral   Take 5 mLs by mouth 2 (two) times daily.   140 mL   0   . chlorpheniramine-HYDROcodone (TUSSIONEX) 10-8 MG/5ML SUER   Oral   Take 5 mLs by mouth every 12 (twelve) hours.   140 mL   0   . ciprofloxacin (CIPRO) 500 MG tablet   Oral   Take 1 tablet (500 mg total) by mouth 2 (two) times daily.   20 tablet   0   . codeine 30 MG tablet   Oral   Take 1 tablet (30 mg total) by mouth every 6 (six) hours as needed for moderate pain.   30 tablet   0   . diazepam (VALIUM) 5 MG tablet   Oral   Take 1 tablet (5 mg total) by mouth every 8 (eight) hours as needed for muscle spasms.   20 tablet   0   . fluticasone (FLOVENT HFA) 220 MCG/ACT inhaler   Inhalation   Inhale 1 puff into the lungs 2 (two) times daily.   1 Inhaler   12   .  guaiFENesin (MUCINEX) 600 MG 12 hr tablet   Oral   Take 1 tablet (600 mg total) by mouth 2 (two) times daily.   20 tablet   0   . metroNIDAZOLE (FLAGYL) 500 MG tablet   Oral   Take 1 tablet (500 mg total) by mouth 3 (three) times daily.   30 tablet   0   . naproxen (NAPROSYN) 500 MG tablet   Oral   Take 1 tablet (500 mg total) by mouth 2 (two) times daily with a meal.   20 tablet   0   . predniSONE (DELTASONE) 20 MG tablet      2 tabs daily for 5 days Patient not taking: Reported on 11/21/2015   10 tablet   0   . rOPINIRole (REQUIP) 2 MG tablet   Oral   Take 1 tablet (2 mg total) by mouth at bedtime. Patient taking differently: Take 4 mg by mouth at bedtime.    30 tablet   0   . traMADol (ULTRAM) 50 MG tablet   Oral   Take 1 tablet (50 mg total) by mouth every 6 (six) hours as needed.   20 tablet   0     Allergies Contrast media; Latex; Ondansetron; Penicillins; and Povidone-iodine  Family History  Problem  Relation Age of Onset  . Cancer Mother 30    ovarian  . Cancer Father 81    brain  . Cancer Daughter 18    skin  . Cancer Maternal Aunt 68    breast    Social History Social History  Substance Use Topics  . Smoking status: Never Smoker   . Smokeless tobacco: Never Used  . Alcohol Use: No    Review of Systems Constitutional: No fever/chills Eyes: No visual changes. ENT: No sore throat. Cardiovascular: Denies chest pain. Respiratory: Denies shortness of breath. Gastrointestinal: No abdominal pain.  No nausea, no vomiting.  No diarrhea.  No constipation. Genitourinary: Negative for dysuria. Musculoskeletal:Right forearm pain  Skin: Negative for rash. Neurological: Negative for headaches, focal weakness or numbness.    ____________________________________________   PHYSICAL EXAM:  VITAL SIGNS: ED Triage Vitals  Enc Vitals Group     BP 02/10/16 1737 140/71 mmHg     Pulse Rate 02/10/16 1737 104     Resp 02/10/16 1737 16     Temp 02/10/16 1737 98.3 F (36.8 C)     Temp Source 02/10/16 1737 Oral     SpO2 02/10/16 1737 99 %     Weight 02/10/16 1737 200 lb (90.719 kg)     Height 02/10/16 1737 5\' 5"  (1.651 m)     Head Cir --      Peak Flow --      Pain Score 02/10/16 1738 7     Pain Loc --      Pain Edu? --      Excl. in [place]? --     Constitutional: Alert and oriented. Well appearing and in no acute distress. Eyes: Conjunctivae are normal. PERRL. EOMI. Head: Atraumatic. Nose: No congestion/rhinnorhea. Mouth/Throat: Mucous membranes are moist.  Oropharynx non-erythematous. Neck: No stridor.  No cervical spine tenderness to palpation. Hematological/Lymphatic/Immunilogical: No cervical lymphadenopathy. Cardiovascular: Normal rate, regular rhythm. Grossly normal heart sounds.  Good peripheral circulation. Respiratory: Normal respiratory effort.  No retractions. Lungs CTAB. Gastrointestinal: Soft and nontender. No distention. No abdominal bruits. No CVA  tenderness. Musculoskeletal: No obvious deformity to the right forearm. There is moderate edema mid forearm. Moderate guarding palpation in the  mid forearm. She has full range of motion with complain of pain. Patient group strength is 3/5. Neurologic:  Normal speech and language. No gross focal neurologic deficits are appreciated. No gait instability. Skin:  Skin is warm, dry and intact. No rash noted. Psychiatric: Mood and affect are normal. Speech and behavior are normal.  ____________________________________________   LABS (all labs ordered are listed, but only abnormal results are displayed)  Labs Reviewed - No data to display ____________________________________________  EKG   ____________________________________________  RADIOLOGY  No acute findings on x-ray of the right forearm. I, Sable Feil, personally viewed and evaluated these images (plain radiographs) as part of my medical decision making, as well as reviewing the written report by the radiologist.  ____________________________________________   PROCEDURES  Procedure(s) performed: None  Critical Care performed: No  ____________________________________________   INITIAL IMPRESSION / ASSESSMENT AND PLAN / ED COURSE  Pertinent labs & imaging results that were available during my care of the patient were reviewed by me and considered in my medical decision making (see chart for details).  Contusion right forearm. Discussed x-ray finding with patient. She given discharge Instructions. Patient placed in a sling. Patient given a prescription for ibuprofen and tramadol. ____________________________________________   FINAL CLINICAL IMPRESSION(S) / ED DIAGNOSES  Final diagnoses:  Contusion of right forearm, initial encounter      NEW MEDICATIONS STARTED DURING THIS VISIT:  New Prescriptions   NAPROXEN (NAPROSYN) 500 MG TABLET    Take 1 tablet (500 mg total) by mouth 2 (two) times daily with a meal.    TRAMADOL (ULTRAM) 50 MG TABLET    Take 1 tablet (50 mg total) by mouth every 6 (six) hours as needed.     Note:  This document was prepared using Dragon voice recognition software and may include unintentional dictation errors.    Sable Feil, PA-C 02/10/16 1846  Wandra Arthurs, MD 02/17/16 2127

## 2016-02-10 NOTE — Discharge Instructions (Signed)
Contusion A contusion is a deep bruise. Contusions happen when an injury causes bleeding under the skin. Symptoms of bruising include pain, swelling, and discolored skin. The skin may turn blue, purple, or yellow. HOME CARE: Wear sling. 3-5 days as needed.  Rest the injured area.  If told, put ice on the injured area.  Put ice in a plastic bag.  Place a towel between your skin and the bag.  Leave the ice on for 20 minutes, 2-3 times per day.  If told, put light pressure (compression) on the injured area using an elastic bandage. Make sure the bandage is not too tight. Remove it and put it back on as told by your doctor.  If possible, raise (elevate) the injured area above the level of your heart while you are sitting or lying down.  Take over-the-counter and prescription medicines only as told by your doctor. GET HELP IF:  Your symptoms do not get better after several days of treatment.  Your symptoms get worse.  You have trouble moving the injured area. GET HELP RIGHT AWAY IF:   You have very bad pain.  You have a loss of feeling (numbness) in a hand or foot.  Your hand or foot turns pale or cold.   This information is not intended to replace advice given to you by your health care provider. Make sure you discuss any questions you have with your health care provider.   Document Released: 02/08/2008 Document Revised: 05/13/2015 Document Reviewed: 01/07/2015 Elsevier Interactive Patient Education [employer].

## 2016-02-10 NOTE — ED Notes (Signed)
Pt arrives to ER via POV c/o right arm pain from running into door while "not paying attention" at work. Pt able to move right arm but it is very painful. Pain worst on right forearm. Pt alert and oriented X4, active, cooperative, pt in NAD. RR even and unlabored, color WNL.

## 2016-02-10 NOTE — ED Notes (Signed)
Pulses, sensation and movement intact to R arm. Pt c/o pain w/ movement

## 2016-02-23 IMAGING — CT CT HEAD WITHOUT CONTRAST
1 series · 16 of 30 positions shown, 20 images · non-contrast
Comparison: 02/25/2011

CLINICAL DATA: Left-sided headache with double vision. Question
left-sided brain mass.

EXAM:
CT HEAD WITHOUT CONTRAST
TECHNIQUE: Contiguous axial images were obtained from the base of the skull
through the vertex without intravenous contrast.

[Series 3: head wo · axial · 0.38mm/px · z∈[-91,+35]mm · 16 of 32 slices shown, 20 images]
[im 2/32  brain]
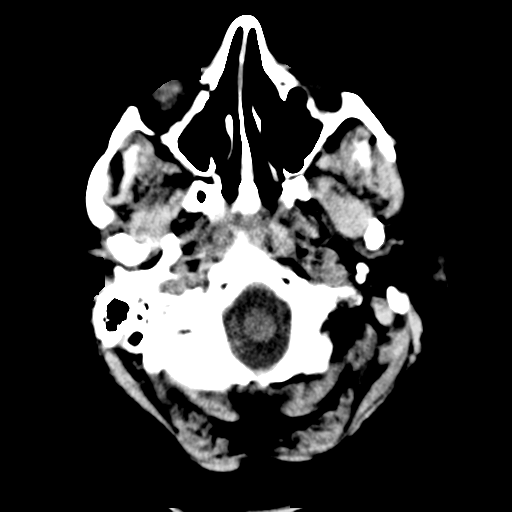
[im 2/32  bone]
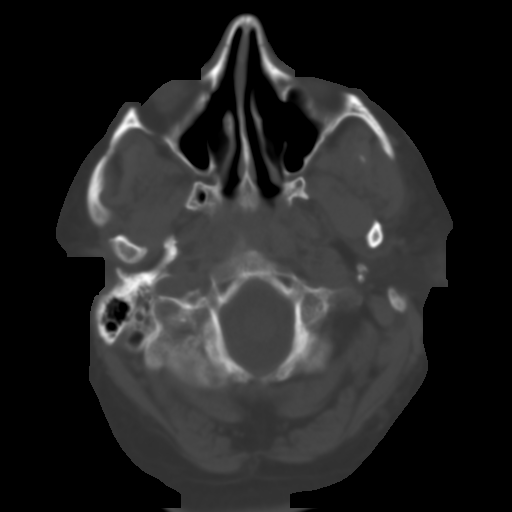
[im 4/32  brain]
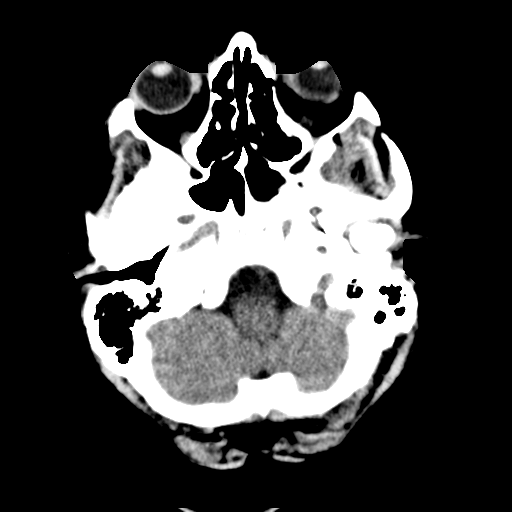
[im 6/32  brain]
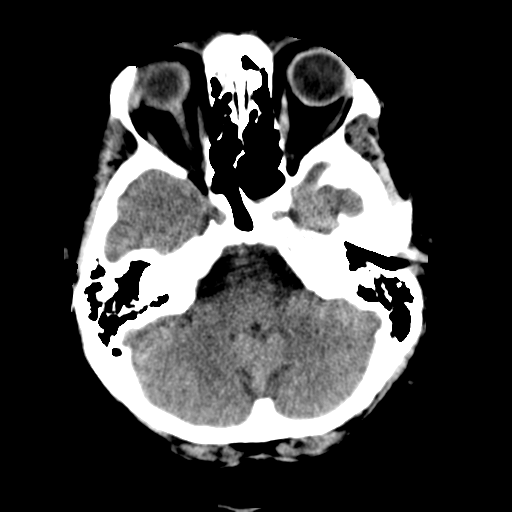
[im 8/32  brain]
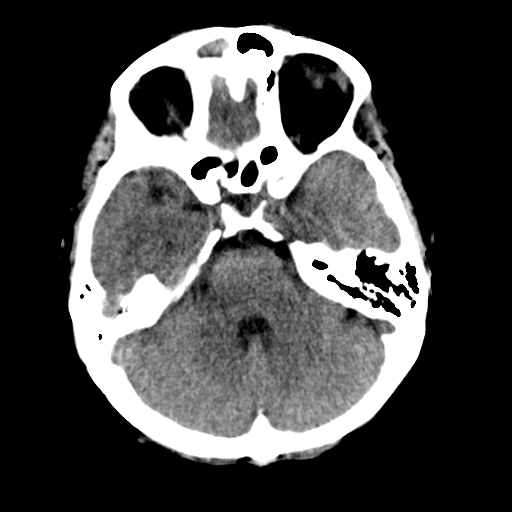
[im 9/32  brain]
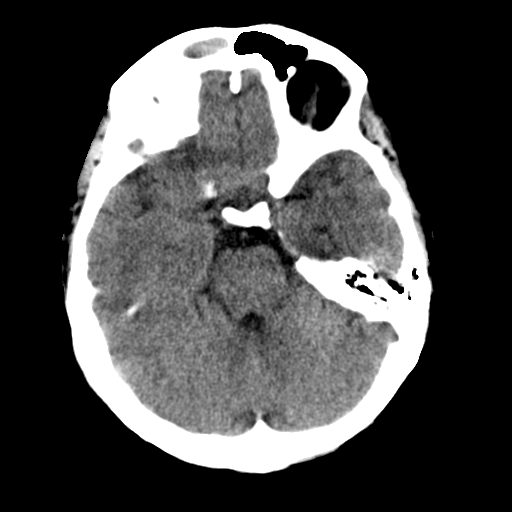
[im 9/32  bone]
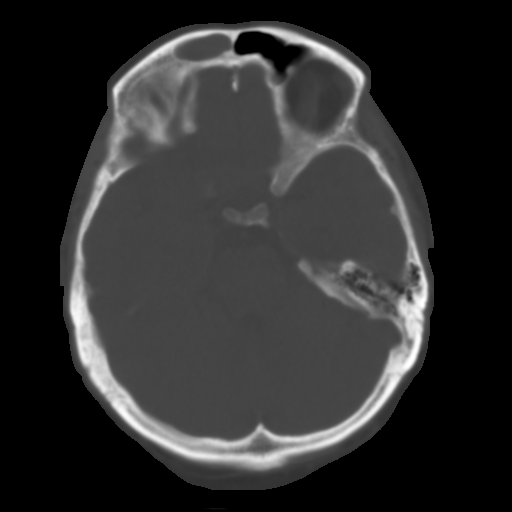
[im 11/32  brain]
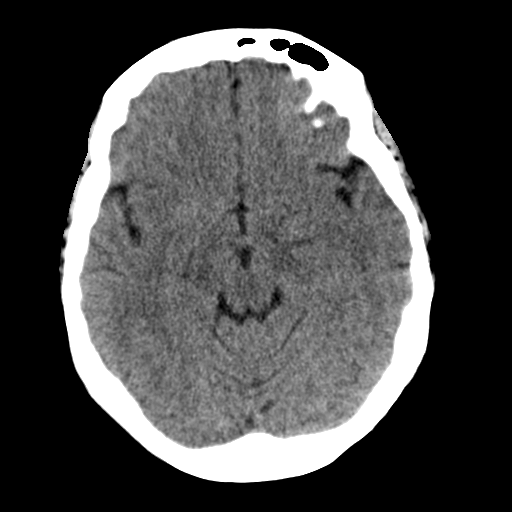
[im 13/32  brain]
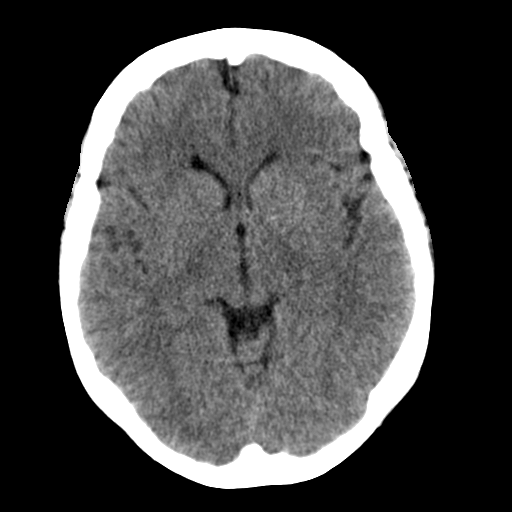
[im 15/32  brain]
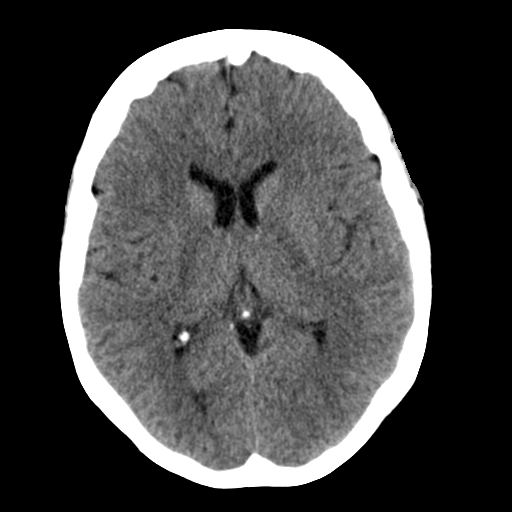
[im 17/32  brain]
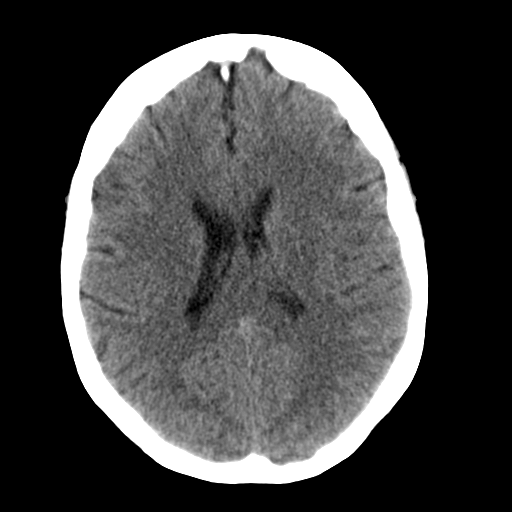
[im 17/32  bone]
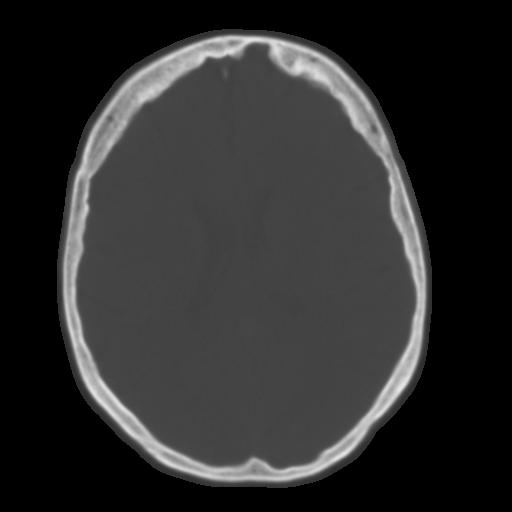
[im 19/32  brain]
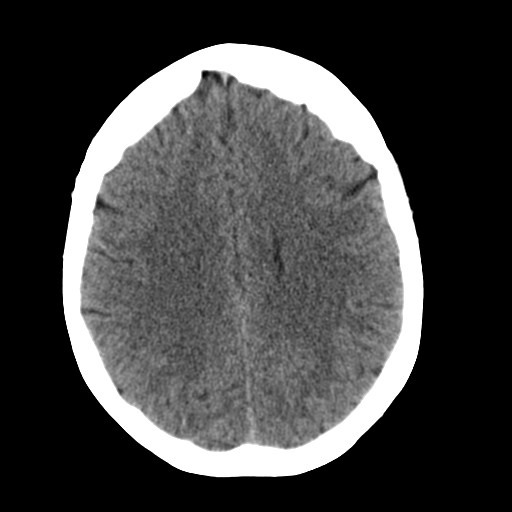
[im 21/32  brain]
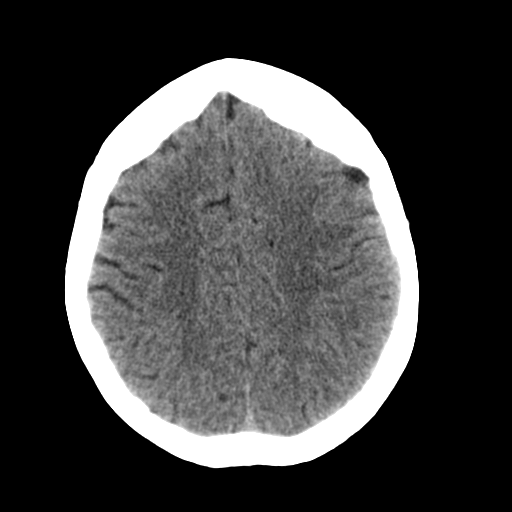
[im 23/32  brain]
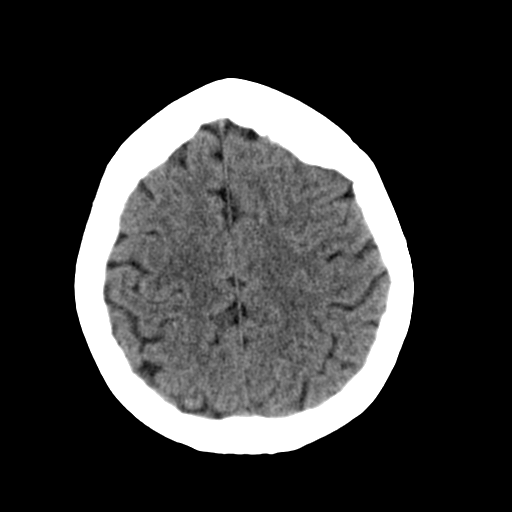
[im 24/32  brain]
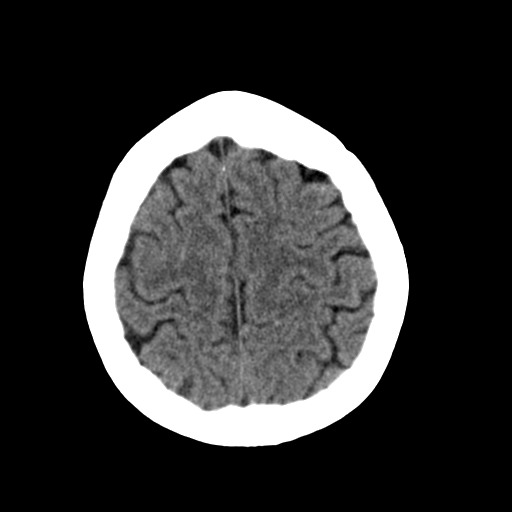
[im 24/32  bone]
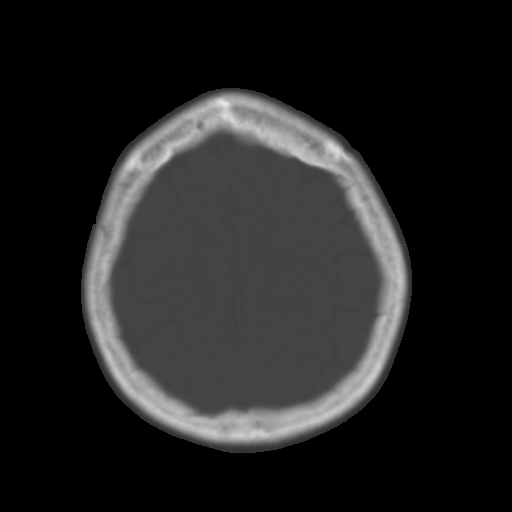
[im 26/32  brain]
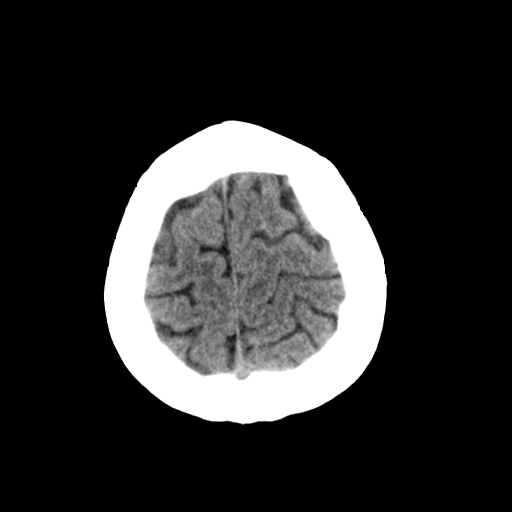
[im 28/32  brain]
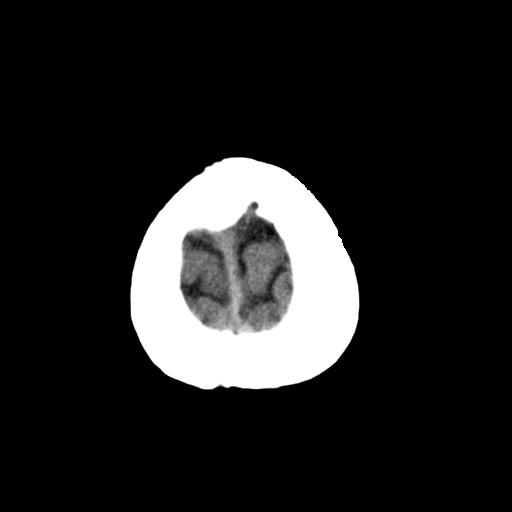
[im 30/32  brain]
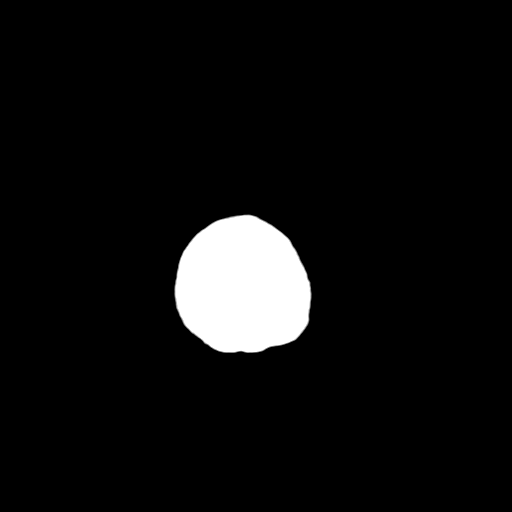

[16 of 30 positions shown; findings below may reference images not displayed]

FINDINGS: Skull and Sinuses:Status post endoscopic sinus surgery to the
maxillary, ethmoid, and sphenoid sinuses. There is mucosal
thickening in the posterior left sphenoid sinus and in the anterior
ethmoid sinuses. The right frontal sinus is completely opacified but
not expanded. Large osteoma in the left frontoethmoidal recess, 14
mm in diameter, without obstruction.

Orbits: No acute abnormality.

Brain: No evidence of acute infarction, hemorrhage, hydrocephalus,
or mass lesion/mass effect.
IMPRESSION: 1. Negative intracranial imaging.
2. Chronic sinusitis with complete opacification of the right
frontal sinus.

## 2016-06-06 ENCOUNTER — Encounter: Payer: Self-pay | Admitting: Emergency Medicine

## 2016-06-06 ENCOUNTER — Emergency Department
Admission: EM | Admit: 2016-06-06 | Discharge: 2016-06-06 | Disposition: A | Discharge status: Home / Self Care | Payer: Self-pay | Attending: Emergency Medicine | Admitting: Emergency Medicine

## 2016-06-06 ENCOUNTER — Emergency Department: Payer: Self-pay

## 2016-06-06 DIAGNOSIS — I509 Heart failure, unspecified: Secondary | ICD-10-CM | POA: Insufficient documentation

## 2016-06-06 DIAGNOSIS — R05 Cough: Secondary | ICD-10-CM

## 2016-06-06 DIAGNOSIS — R059 Cough, unspecified: Secondary | ICD-10-CM

## 2016-06-06 DIAGNOSIS — R079 Chest pain, unspecified: Secondary | ICD-10-CM

## 2016-06-06 DIAGNOSIS — Z79899 Other long term (current) drug therapy: Secondary | ICD-10-CM | POA: Insufficient documentation

## 2016-06-06 DIAGNOSIS — Z91040 Latex allergy status: Secondary | ICD-10-CM | POA: Insufficient documentation

## 2016-06-06 DIAGNOSIS — J40 Bronchitis, not specified as acute or chronic: Secondary | ICD-10-CM | POA: Insufficient documentation

## 2016-06-06 LAB — CBC
HEMATOCRIT: 39 % (ref 35.0–47.0)
HEMOGLOBIN: 13 g/dL (ref 12.0–16.0)
MCH: 26.1 pg (ref 26.0–34.0)
MCHC: 33.4 g/dL (ref 32.0–36.0)
MCV: 78.1 fL — AB (ref 80.0–100.0)
Platelets: 255 10*3/uL (ref 150–440)
RBC: 4.99 MIL/uL (ref 3.80–5.20)
RDW: 17.3 % — ABNORMAL HIGH (ref 11.5–14.5)
WBC: 8.7 10*3/uL (ref 3.6–11.0)

## 2016-06-06 LAB — BASIC METABOLIC PANEL
ANION GAP: 6 (ref 5–15)
BUN: 10 mg/dL (ref 6–20)
CHLORIDE: 107 mmol/L (ref 101–111)
CO2: 28 mmol/L (ref 22–32)
Calcium: 9.4 mg/dL (ref 8.9–10.3)
Creatinine, Ser: 1.03 mg/dL — ABNORMAL HIGH (ref 0.44–1.00)
GFR calc Af Amer: >60 mL/min (ref >60)
GLUCOSE: 114 mg/dL — AB (ref 65–99)
POTASSIUM: 3.6 mmol/L (ref 3.5–5.1)
Sodium: 141 mmol/L (ref 135–145)

## 2016-06-06 LAB — BLOOD GAS, VENOUS
ACID-BASE EXCESS: 1.8 mmol/L (ref 0.0–2.0)
BICARBONATE: 25.7 mmol/L (ref 20.0–28.0)
O2 Saturation: 77.8 %
PH VEN: 7.45 — AB (ref 7.250–7.430)
PO2 VEN: 40 mmHg (ref 32.0–45.0)
Patient temperature: 37
pCO2, Ven: 37 mmHg — ABNORMAL LOW (ref 44.0–60.0)

## 2016-06-06 LAB — FIBRIN DERIVATIVES D-DIMER (ARMC ONLY): Fibrin derivatives D-dimer (ARMC): 514 — ABNORMAL HIGH (ref 0–499)

## 2016-06-06 LAB — BRAIN NATRIURETIC PEPTIDE: B Natriuretic Peptide: 91 pg/mL (ref 0.0–100.0)

## 2016-06-06 LAB — TROPONIN I: Troponin I: <0.03 ng/mL (ref <0.03)

## 2016-06-06 MED ORDER — HYDROCOD POLST-CPM POLST ER 10-8 MG/5ML PO SUER
5.0000 mL | Freq: Once | ORAL | Status: AC
  Administered 2016-06-06: 5 mL via ORAL
  Filled 2016-06-06: qty 5

## 2016-06-06 MED ORDER — TECHNETIUM TC 99M DIETHYLENETRIAME-PENTAACETIC ACID
33.6410 | Freq: Once | INTRAVENOUS | Status: AC | PRN
  Administered 2016-06-06: 33.641 via RESPIRATORY_TRACT

## 2016-06-06 MED ORDER — ALBUTEROL SULFATE HFA 108 (90 BASE) MCG/ACT IN AERS: 2.0000 | INHALATION_SPRAY | Freq: Four times a day (QID) | RESPIRATORY_TRACT | 2 refills | Status: DC | PRN

## 2016-06-06 MED ORDER — PREDNISONE 20 MG PO TABS: 60.0000 mg | ORAL_TABLET | Freq: Every day | ORAL | 0 refills | Status: AC

## 2016-06-06 MED ORDER — IPRATROPIUM-ALBUTEROL 0.5-2.5 (3) MG/3ML IN SOLN
3.0000 mL | Freq: Once | RESPIRATORY_TRACT | Status: AC
  Administered 2016-06-06: 3 mL via RESPIRATORY_TRACT
  Filled 2016-06-06: qty 3

## 2016-06-06 MED ORDER — SODIUM CHLORIDE 0.9 % IV BOLUS (SEPSIS)
500.0000 mL | Freq: Once | INTRAVENOUS | Status: AC
  Administered 2016-06-06: 500 mL via INTRAVENOUS

## 2016-06-06 MED ORDER — HYDROCOD POLST-CPM POLST ER 10-8 MG/5ML PO SUER: 5.0000 mL | Freq: Two times a day (BID) | ORAL | 0 refills | Status: DC

## 2016-06-06 MED ORDER — MORPHINE SULFATE (PF) 4 MG/ML IV SOLN
4.0000 mg | Freq: Once | INTRAVENOUS | Status: AC
  Administered 2016-06-06: 4 mg via INTRAVENOUS
  Filled 2016-06-06: qty 1

## 2016-06-06 MED ORDER — AZITHROMYCIN 500 MG PO TABS
500.0000 mg | ORAL_TABLET | Freq: Once | ORAL | Status: AC
  Administered 2016-06-06: 500 mg via ORAL
  Filled 2016-06-06: qty 1

## 2016-06-06 MED ORDER — AZITHROMYCIN 250 MG PO TABS: ORAL_TABLET | ORAL | 0 refills | Status: AC

## 2016-06-06 MED ORDER — KETOROLAC TROMETHAMINE 30 MG/ML IJ SOLN
30.0000 mg | Freq: Once | INTRAMUSCULAR | Status: AC
  Administered 2016-06-06: 30 mg via INTRAVENOUS
  Filled 2016-06-06: qty 1

## 2016-06-06 MED ORDER — ACETAMINOPHEN 325 MG PO TABS
650.0000 mg | ORAL_TABLET | Freq: Once | ORAL | Status: AC
  Administered 2016-06-06: 650 mg via ORAL
  Filled 2016-06-06: qty 2

## 2016-06-06 MED ORDER — TECHNETIUM TO 99M ALBUMIN AGGREGATED
4.5260 | Freq: Once | INTRAVENOUS | Status: AC | PRN
  Administered 2016-06-06: 4.526 via INTRAVENOUS

## 2016-06-06 MED ORDER — ALBUTEROL SULFATE (2.5 MG/3ML) 0.083% IN NEBU
2.5000 mg | INHALATION_SOLUTION | Freq: Once | RESPIRATORY_TRACT | Status: AC
  Administered 2016-06-06: 2.5 mg via RESPIRATORY_TRACT
  Filled 2016-06-06: qty 3

## 2016-06-06 MED ORDER — METOPROLOL TARTRATE 25 MG PO TABS
25.0000 mg | ORAL_TABLET | Freq: Once | ORAL | Status: AC
  Administered 2016-06-06: 25 mg via ORAL
  Filled 2016-06-06: qty 1

## 2016-06-06 MED ORDER — PREDNISONE 20 MG PO TABS
60.0000 mg | ORAL_TABLET | Freq: Once | ORAL | Status: AC
  Administered 2016-06-06: 60 mg via ORAL
  Filled 2016-06-06: qty 3

## 2016-06-06 NOTE — ED Triage Notes (Signed)
Pt presents to ED with c/o cough, generalize chest pain due to cough, SOB, onset about a week. Hx CHF.

## 2016-06-06 NOTE — ED Provider Notes (Signed)
[facility] Emergency Department Provider Note   ____________________________________________   First MD Initiated Contact with Patient 06/06/16 0408     (approximate)  I have reviewed the triage vital signs and the nursing notes.   HISTORY  Chief Complaint Cough; Chest Pain; and Shortness of Breath    HPI Brittany Nguyen is a 50 y.o. female who comes into the hospital today with shortness of breath and consistent cough. The patient reports that she had a cold a few weeks ago but thought it was coming Better. She reports that she started having a persistent cough that would not seem to go away. She reports that it started to get worse on Friday. The patient has been taking Robitussin, mucinex, and cough gel capsule. The patient is also been taking some ibuprofen which has not been helping. She denies any sick contacts and denies any significant fevers. The patient does not smoke. She did have this once before and was told that led to her congestive heart failure at the time. The patient reports that she's been having some fluid lately on her feet and hands and ankles. The cough is nonproductive. The patient does have some chest pain. She also reports that she has some back pain. The patient is here for evaluation.   Past Medical History:  Diagnosis Date  . Asthma 2013  . CHF (congestive heart failure) ([place])   . Diverticulitis 2010  . DVT (deep venous thrombosis) ([place])   . Endometriosis 1990  . Heart disease 2013  . Hx MRSA infection   . Lump or mass in breast   . Restless leg     Patient Active Problem List   Diagnosis Date Noted  . Acute bronchitis 11/22/2015  . Cough, persistent 11/22/2015  . Hypokalemia 11/22/2015  . Leukocytosis 11/22/2015  . Dyspnea 11/21/2015  . Respiratory distress 09/19/2015  . Breast pain 01/11/2013  . Hx MRSA infection   . Lump or mass in breast   . GOITER, MULTINODULAR 01/20/2009  . HYPOGLYCEMIA, UNSPECIFIED  01/20/2009  . GERD 01/20/2009  . DIVERTICULITIS OF COLON 01/20/2009    Past Surgical History:  Procedure Laterality Date  . ABDOMINAL HYSTERECTOMY     age 42  . CESAREAN SECTION    . CHOLECYSTECTOMY    . COLECTOMY    . NASAL SINUS SURGERY  2012    Prior to Admission medications   Medication Sig Start Date End Date Taking? Authorizing Provider  rOPINIRole (REQUIP) 2 MG tablet Take 1 tablet (2 mg total) by mouth at bedtime. Patient taking differently: Take 4 mg by mouth at bedtime.  05/14/15  Yes Johnn Hai, PA-C    Allergies Contrast media [iodinated diagnostic agents]; Latex; Ondansetron; Penicillins; and Povidone-iodine  Family History  Problem Relation Age of Onset  . Cancer Mother 30    ovarian  . Cancer Father 71    brain  . Cancer Daughter 18    skin  . Cancer Maternal Aunt 10    breast    Social History Social History  Substance Use Topics  . Smoking status: Never Smoker  . Smokeless tobacco: Never Used  . Alcohol use No    Review of Systems Constitutional: No fever/chills Eyes: No visual changes. ENT: No sore throat. Cardiovascular:  chest pain. Respiratory: cough and shortness of breath. Gastrointestinal: No abdominal pain.  No nausea, no vomiting.  No diarrhea.  No constipation. Genitourinary: Negative for dysuria. Musculoskeletal: Negative for back pain. Skin: Negative for rash. Neurological:  Negative for headaches, focal weakness or numbness.  10-point ROS otherwise negative.  ____________________________________________   PHYSICAL EXAM:  VITAL SIGNS: ED Triage Vitals  Enc Vitals Group     BP 06/06/16 0226 (!) 130/91     Pulse Rate 06/06/16 0226 (!) 129     Resp 06/06/16 0226 (!) 22     Temp 06/06/16 0226 98.1 F (36.7 C)     Temp Source 06/06/16 0226 Oral     SpO2 06/06/16 0226 98 %     Weight 06/06/16 0227 200 lb (90.7 kg)     Height 06/06/16 0227 5\' 5"  (1.651 m)     Head Circumference --      Peak Flow --      Pain Score  06/06/16 0238 9     Pain Loc --      Pain Edu? --      Excl. in [place]? --     Constitutional: Alert and oriented. Well appearing and in moderate distress due to coughing. Eyes: Conjunctivae are normal. PERRL. EOMI. Head: Atraumatic. Nose: No congestion/rhinnorhea. Mouth/Throat: Mucous membranes are moist.  Oropharynx non-erythematous. Cardiovascular: Tachycardia regular rhythm. Grossly normal heart sounds.  Good peripheral circulation. Respiratory: increased respiratory effort. With some prolonged expiratory phase Gastrointestinal: Soft and nontender. No distention. Positive bowel sounds Musculoskeletal: No lower extremity tenderness nor edema.   Neurologic:  Normal speech and language.  Skin:  Skin is warm, dry and intact.  Psychiatric: Mood and affect are normal.   ____________________________________________   LABS (all labs ordered are listed, but only abnormal results are displayed)  Labs Reviewed  BASIC METABOLIC PANEL - Abnormal; Notable for the following:       Result Value   Glucose, Bld 114 (*)    Creatinine, Ser 1.03 (*)    All other components within normal limits  CBC - Abnormal; Notable for the following:    MCV 78.1 (*)    RDW 17.3 (*)    All other components within normal limits  BLOOD GAS, VENOUS - Abnormal; Notable for the following:    pH, Ven 7.45 (*)    pCO2, Ven 37 (*)    All other components within normal limits  TROPONIN I  BRAIN NATRIURETIC PEPTIDE  TROPONIN I   ____________________________________________  EKG  ED ECG REPORT I, Loney Hering, the attending physician, personally viewed and interpreted this ECG.   Date: 06/06/2016  EKG Time: 235  Rate: 127  Rhythm: sinus tachycardia  Axis: left axis deviation  Intervals:none  ST&T Change: none  ____________________________________________  RADIOLOGY  CXR ____________________________________________   PROCEDURES  Procedure(s) performed: None  Procedures  Critical Care  performed: No  ____________________________________________   INITIAL IMPRESSION / ASSESSMENT AND PLAN / ED COURSE  Pertinent labs & imaging results that were available during my care of the patient were reviewed by me and considered in my medical decision making (see chart for details).  This is a 50 year old female who comes into the hospital today with shortness of breath, chest pain and cough. The patient has not been having any fevers but the cough is making her chest hurt and is making her feel like she can't catch her breath. I will give the patient a DuoNeb treatment as well as a dose of prednisone. I'll also give the patient some Tussionex and I will reassess the patient.  Clinical Course  Value Comment By Time  DG Chest 2 View No active cardiopulmonary disease. Loney Hering, MD 10/02 [phone]  The patient's coughing is improved after some DuoNeb treatments and albuterol. She still remains tachycardic. I will give the patient a 500 mL bolus of normal saline. The patient's care was signed out to Dr.Varonese who will follow-up the patient and disposition the patient. Her tachycardia may also be contributing from the albuterol.  ____________________________________________   FINAL CLINICAL IMPRESSION(S) / ED DIAGNOSES  Final diagnoses:  Cough  Bronchitis      NEW MEDICATIONS STARTED DURING THIS VISIT:  New Prescriptions   No medications on file     Note:  This document was prepared using Dragon voice recognition software and may include unintentional dictation errors.    Loney Hering, MD 06/06/16 [phone]

## 2016-06-06 NOTE — ED Provider Notes (Signed)
-----------------------------------------   11:21 AM on 06/06/2016 -----------------------------------------   Blood pressure (!) 104/59, pulse (!) 117, temperature 98.1 F (36.7 C), temperature source Oral, resp. rate 11, height 5\' 5"  (1.651 m), weight 200 lb (90.7 kg), SpO2 98 %.  Assuming care from Dr. Dahlia Client of [place] Jariwala Burks is a 50 y.o. female with a chief complaint of Cough; Chest Pain; and Shortness of Breath .    65F h/o DVT, CHF, asthma presented for evaluation of pleuritic chest pain, shortness of breath, and cough. Patient has had URI symptoms 2 weeks ago. No body aches, fever, chills. Patient was given albuterol, prednisone, and azithromycin. She was persistently tachycardic and was given IV fluids 500 cc bolus. Plan was to follow-up after fluids to see if patient's tachycardia had resolved.  I reassessed patient continues to endorse 9 out of 10 pleuritic chest pain. She remains persistently tachycardic to the 120s after IVF.   _________________________ 3:43 PM on 06/06/2016 -----------------------------------------  D-dimer was elevated and patient underwent VQ scan as she has allergic reaction to contrast. VQ is negative for PE. VS have normalized. Patient feels better after IVF. Will dc home with tussionex, z-pack, prednisone, and albuterol. Patient will f/u with PCP. Patient comfortable with plan.   Rudene Re, MD 06/06/16 1544

## 2016-06-06 NOTE — ED Notes (Signed)
Pt transported to VQ at this time

## 2016-06-06 NOTE — ED Notes (Signed)
Pt husband, Abe People , [phone]

## 2016-08-05 DIAGNOSIS — I2109 ST elevation (STEMI) myocardial infarction involving other coronary artery of anterior wall: Secondary | ICD-10-CM

## 2016-08-05 HISTORY — DX: ST elevation (STEMI) myocardial infarction involving other coronary artery of anterior wall: I21.09

## 2016-08-07 IMAGING — CT CT ABD-PELV W/O CM
1 of 2 series · 15 of 32 positions shown, 19 images · non-contrast
Comparison: None.

CLINICAL DATA: Left lower abdominal pain for 1 week, which nausea
and diarrhea.

EXAM:
CT ABDOMEN AND PELVIS WITHOUT CONTRAST
TECHNIQUE: Multidetector CT imaging of the abdomen and pelvis was performed
following the standard protocol without IV contrast.

[Series 2: routine abd pel without · axial · non-contrast · 0.97mm/px · z∈[-519,-99]mm · 15 of 92 slices shown, 19 images]
[im 4/92  soft-tissue]
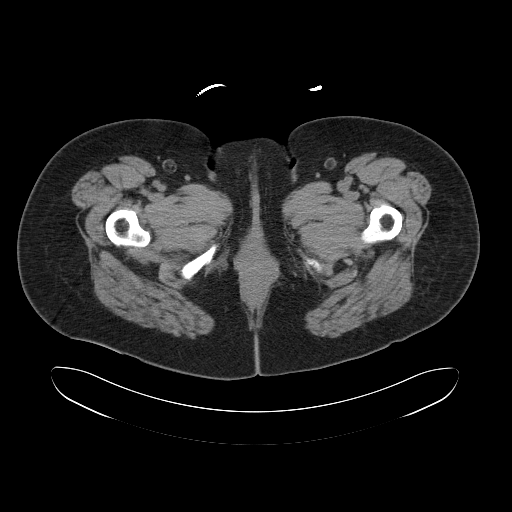
[im 4/92  bone]
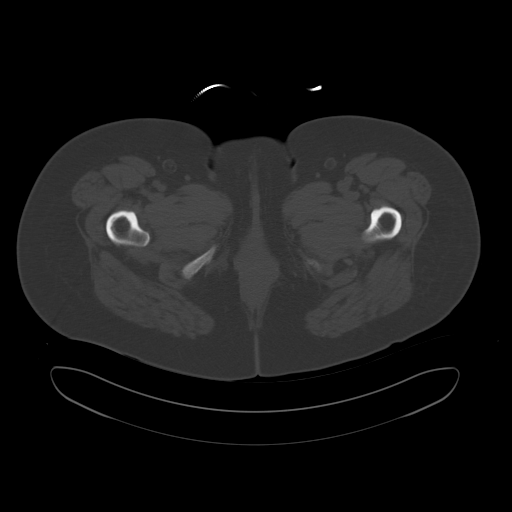
[im 12/92  soft-tissue]
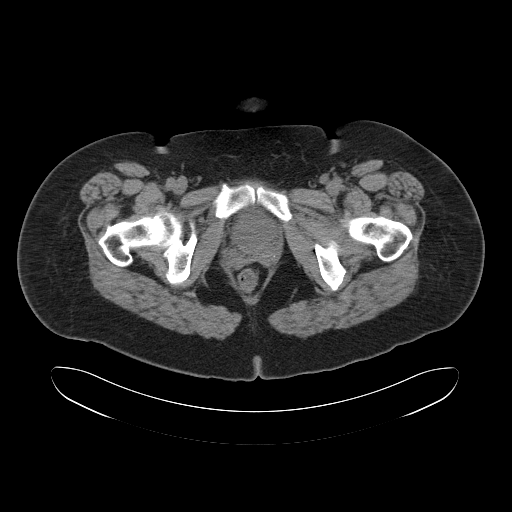
[im 19/92  soft-tissue]
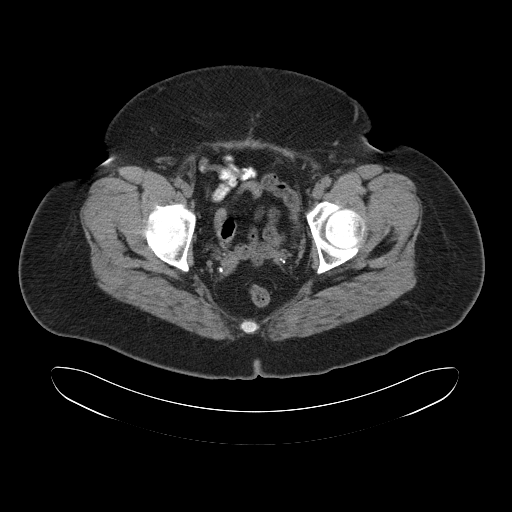
[im 27/92  soft-tissue]
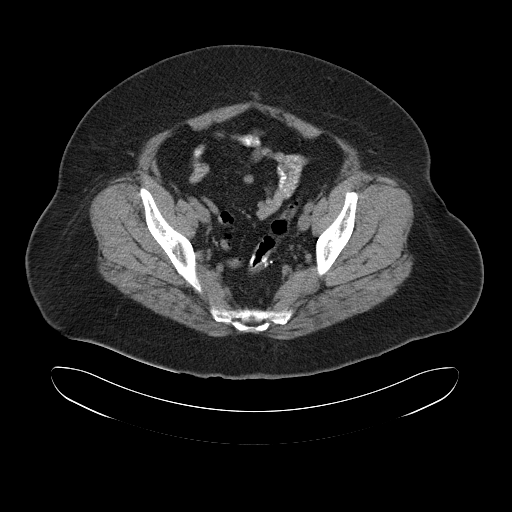
[im 31/92  soft-tissue]
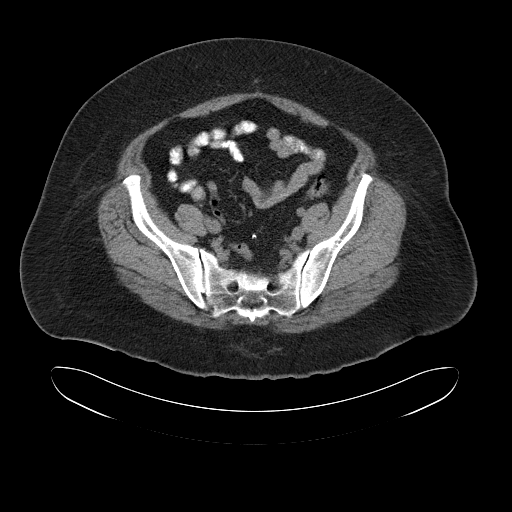
[im 38/92  soft-tissue]
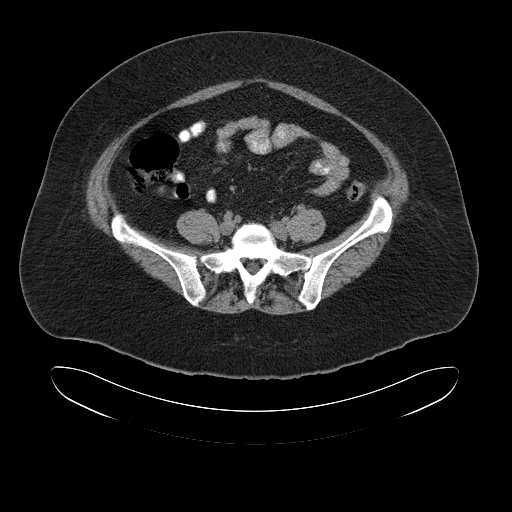
[im 46/92  soft-tissue]
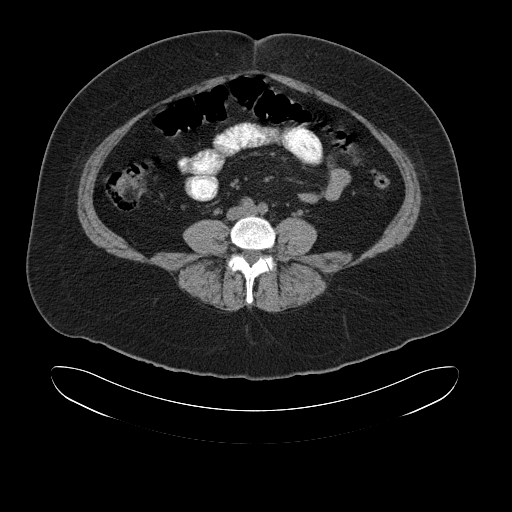
[im 54/92  soft-tissue]
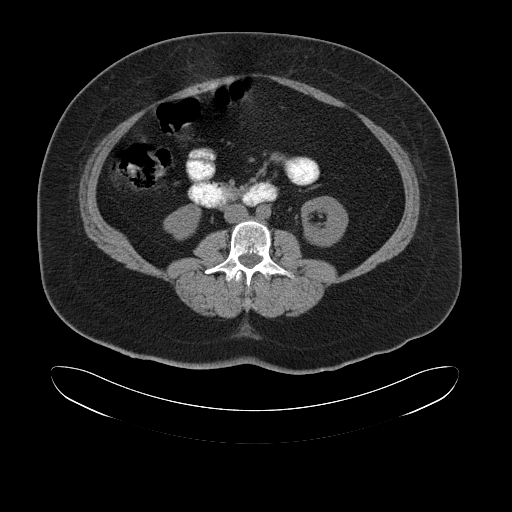
[im 61/92  soft-tissue]
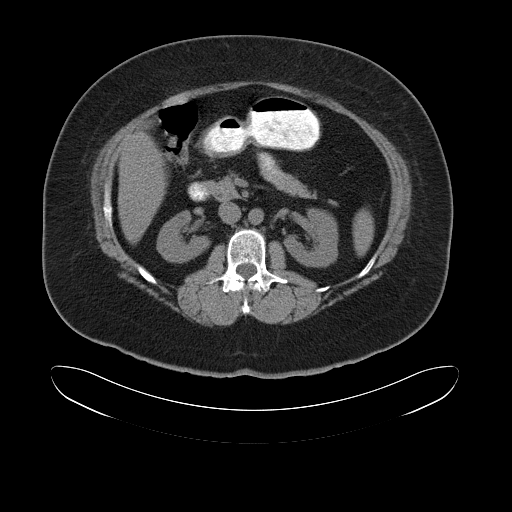
[im 61/92  bone]
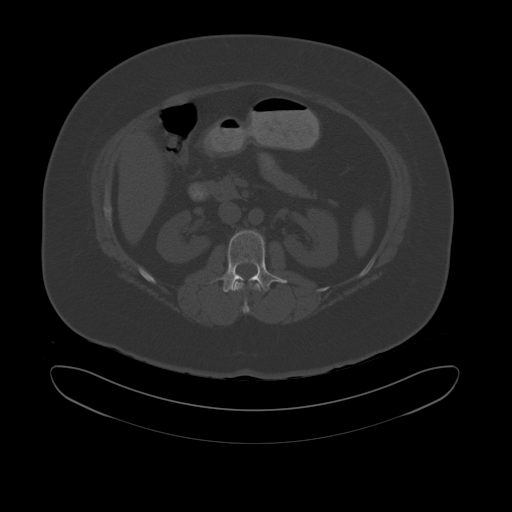
[im 65/92  soft-tissue]
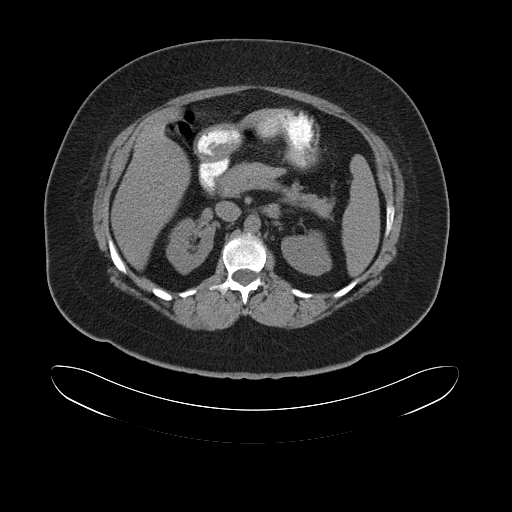
[im 73/92  soft-tissue]
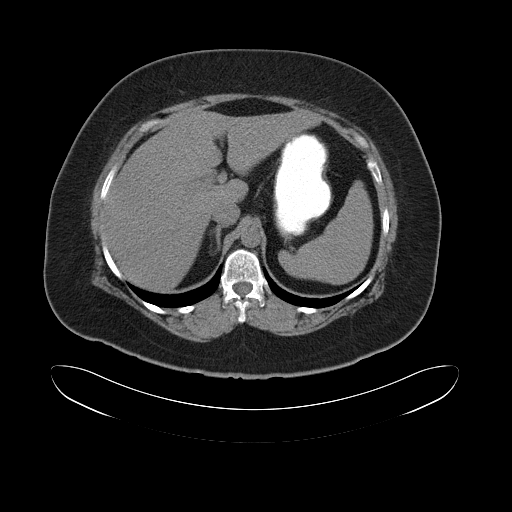
[im 76/92  lung]
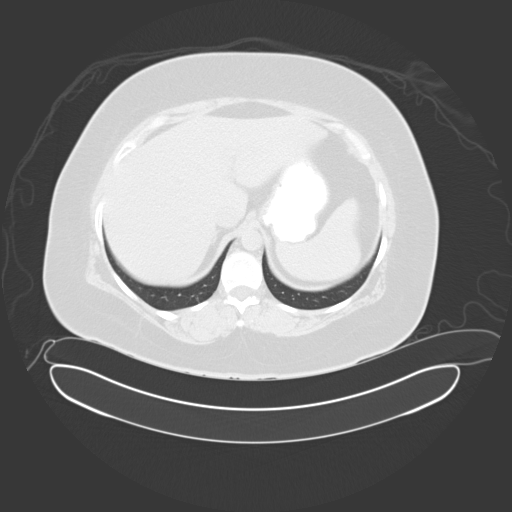
[im 80/92  soft-tissue]
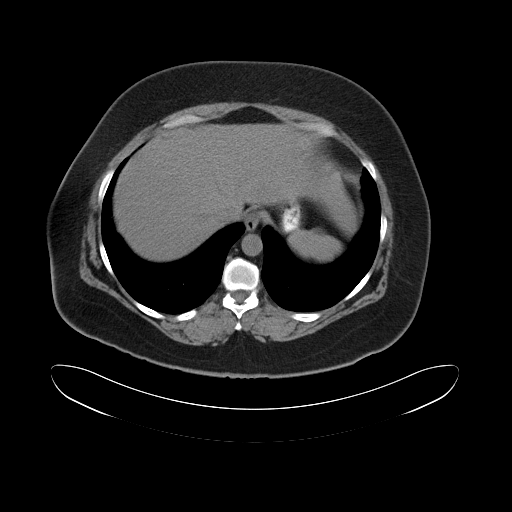
[im 80/92  lung]
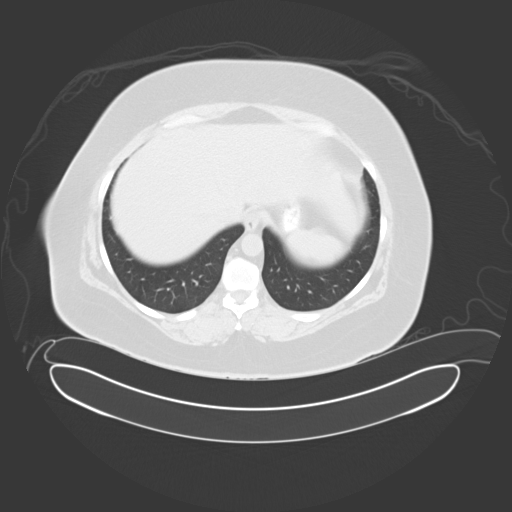
[im 84/92  lung]
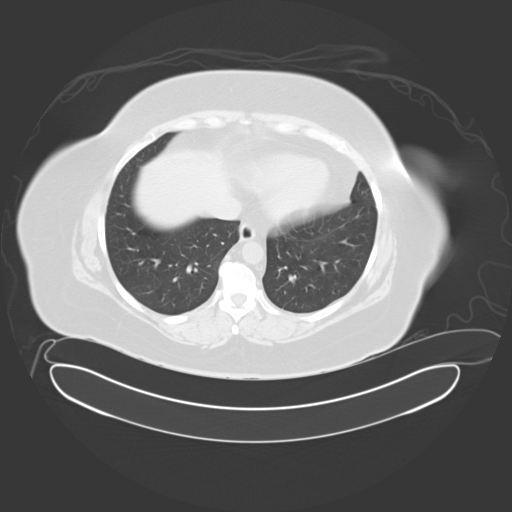
[im 88/92  soft-tissue]
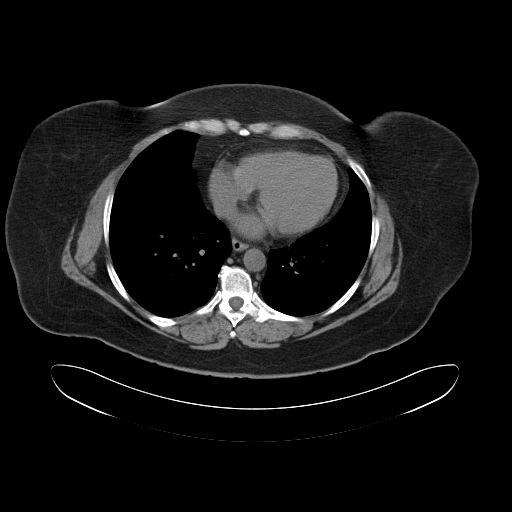
[im 88/92  lung]
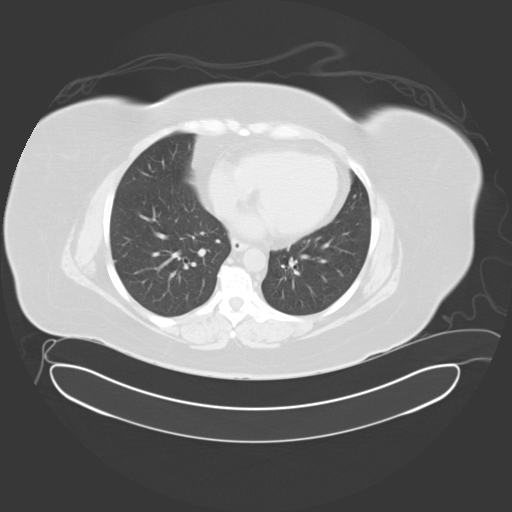

[15 of 32 positions shown; findings below may reference images not displayed]

FINDINGS: Lower chest:  Unremarkable.

Hepatobiliary: No masses or other significant abnormality
identified. There has been a prior cholecystectomy.

Pancreas: No evidence of mass, inflammatory changes, or other
significant abnormality.

Spleen:  Within normal limits in size and appearance.

Adrenal Glands:  No masses identified.

Kidneys/Urinary Tract:  Normal nonenhanced appearance.

Stomach/Bowel/Peritoneum: No evidence of wall thickening, mass, or
obstruction.

Vascular/Lymphatic: No pathologically enlarged lymph nodes
identified. No other significant abnormality noted.

Reproductive: The uterus is likely surgically absent. No evidence of
adnexal masses.

Other: Postsurgical changes within the pelvis, and enterotomy line
within the mid sigmoid colon are seen.

Musculoskeletal:  No suspicious bone lesions identified.
IMPRESSION: No evidence of abnormality within the solid abdominal organs,
accounting for lack of IV contrast.

No evidence of small bowel obstruction, diverticulitis or
diverticulosis.

## 2016-08-24 ENCOUNTER — Emergency Department
Admission: EM | Admit: 2016-08-24 | Discharge: 2016-08-24 | Disposition: A | Discharge status: Home / Self Care | Payer: Self-pay | Attending: Emergency Medicine | Admitting: Emergency Medicine

## 2016-08-24 ENCOUNTER — Emergency Department: Payer: Self-pay

## 2016-08-24 DIAGNOSIS — J45909 Unspecified asthma, uncomplicated: Secondary | ICD-10-CM | POA: Insufficient documentation

## 2016-08-24 DIAGNOSIS — J209 Acute bronchitis, unspecified: Secondary | ICD-10-CM | POA: Insufficient documentation

## 2016-08-24 DIAGNOSIS — I509 Heart failure, unspecified: Secondary | ICD-10-CM | POA: Insufficient documentation

## 2016-08-24 LAB — INFLUENZA PANEL BY PCR (TYPE A & B)
Influenza A By PCR: NEGATIVE
Influenza B By PCR: NEGATIVE

## 2016-08-24 MED ORDER — IBUPROFEN 600 MG PO TABS
600.0000 mg | ORAL_TABLET | Freq: Once | ORAL | Status: AC
  Administered 2016-08-24: 600 mg via ORAL
  Filled 2016-08-24: qty 1

## 2016-08-24 MED ORDER — HYDROCOD POLST-CPM POLST ER 10-8 MG/5ML PO SUER: 5.0000 mL | Freq: Two times a day (BID) | ORAL | 0 refills | Status: DC | PRN

## 2016-08-24 MED ORDER — HYDROCOD POLST-CPM POLST ER 10-8 MG/5ML PO SUER
5.0000 mL | Freq: Once | ORAL | Status: AC
  Administered 2016-08-24: 5 mL via ORAL
  Filled 2016-08-24: qty 5

## 2016-08-24 MED ORDER — AZITHROMYCIN 500 MG PO TABS: 500.0000 mg | ORAL_TABLET | Freq: Every day | ORAL | 0 refills | Status: AC

## 2016-08-24 MED ORDER — AZITHROMYCIN 500 MG PO TABS
500.0000 mg | ORAL_TABLET | Freq: Once | ORAL | Status: AC
  Administered 2016-08-24: 500 mg via ORAL
  Filled 2016-08-24: qty 1

## 2016-08-24 NOTE — ED Notes (Signed)
PT returned from Xray 

## 2016-08-24 NOTE — ED Triage Notes (Addendum)
Pt to triage via w/c, appears uncomfortable, mask in place; pt reports cough and fever that began few days ago with body aches and HA

## 2016-08-24 NOTE — ED Provider Notes (Signed)
[facility] Emergency Department Provider Note    First MD Initiated Contact with Patient 08/24/16 [phone]     (approximate)  I have reviewed the triage vital signs and the nursing notes.   HISTORY  Chief Complaint Cough and Fever   HPI Timberlynn Stanback is a 50 y.o. female as well as her chronic medical conditions presents to the emergency department with nonproductive cough fever which began approximately 3 days ago associated with generalized body aches   Past Medical History:  Diagnosis Date  . Asthma 2013  . CHF (congestive heart failure) ([place])   . Diverticulitis 2010  . DVT (deep venous thrombosis) ([place])   . Endometriosis 1990  . Heart disease 2013  . Hx MRSA infection   . Lump or mass in breast   . Restless leg     Patient Active Problem List   Diagnosis Date Noted  . Acute bronchitis 11/22/2015  . Cough, persistent 11/22/2015  . Hypokalemia 11/22/2015  . Leukocytosis 11/22/2015  . Dyspnea 11/21/2015  . Respiratory distress 09/19/2015  . Breast pain 01/11/2013  . Hx MRSA infection   . Lump or mass in breast   . GOITER, MULTINODULAR 01/20/2009  . HYPOGLYCEMIA, UNSPECIFIED 01/20/2009  . GERD 01/20/2009  . DIVERTICULITIS OF COLON 01/20/2009    Past Surgical History:  Procedure Laterality Date  . ABDOMINAL HYSTERECTOMY     age 52  . CESAREAN SECTION    . CHOLECYSTECTOMY    . COLECTOMY    . NASAL SINUS SURGERY  2012    Prior to Admission medications   Medication Sig Start Date End Date Taking? Authorizing Provider  albuterol (PROVENTIL HFA;VENTOLIN HFA) 108 (90 Base) MCG/ACT inhaler Inhale 2 puffs into the lungs every 6 (six) hours as needed for wheezing or shortness of breath. 06/06/16   Rudene Re, MD  chlorpheniramine-HYDROcodone [facility]) 10-8 MG/5ML SUER Take 5 mLs by mouth 2 (two) times daily. 06/06/16   Rudene Re, MD  rOPINIRole (REQUIP) 2 MG tablet Take 1 tablet (2 mg total) by mouth at  bedtime. Patient taking differently: Take 4 mg by mouth at bedtime.  05/14/15   Johnn Hai, PA-C    Allergies Contrast media [iodinated diagnostic agents]; Latex; Ondansetron; Penicillins; and Povidone-iodine  Family History  Problem Relation Age of Onset  . Cancer Mother 30    ovarian  . Cancer Father 40    brain  . Cancer Daughter 18    skin  . Cancer Maternal Aunt 48    breast    Social History Social History  Substance Use Topics  . Smoking status: Never Smoker  . Smokeless tobacco: Never Used  . Alcohol use No    Review of Systems Constitutional: Positive for fever/chills positive for generalized myalgias Eyes: No visual changes. ENT: No sore throat. Cardiovascular: Denies chest pain. Respiratory: Denies shortness of breath. Positive for cough Gastrointestinal: No abdominal pain.  No nausea, no vomiting.  No diarrhea.  No constipation. Genitourinary: Negative for dysuria. Musculoskeletal: Negative for back pain. Skin: Negative for rash. Neurological: Negative for headaches, focal weakness or numbness.  10-point ROS otherwise negative.  ____________________________________________   PHYSICAL EXAM:  VITAL SIGNS: ED Triage Vitals  Enc Vitals Group     BP 08/24/16 0239 (!) 147/75     Pulse Rate 08/24/16 0239 (!) 123     Resp 08/24/16 0239 (!) 22     Temp 08/24/16 0239 100.1 F (37.8 C)     Temp Source 08/24/16  0239 Oral     SpO2 08/24/16 0239 97 %     Weight 08/24/16 0238 200 lb (90.7 kg)     Height 08/24/16 0238 5\' 5"  (1.651 m)     Head Circumference --      Peak Flow --      Pain Score 08/24/16 0239 8     Pain Loc --      Pain Edu? --      Excl. in [place]? --     Constitutional: Alert and oriented. Well appearing and in no acute distress.Actively coughing Eyes: Conjunctivae are normal. PERRL. EOMI. Head: Atraumatic. Ears:  Healthy appearing ear canals and TMs bilaterally Nose: No congestion/rhinnorhea. Mouth/Throat: Mucous membranes are moist.   Oropharynx non-erythematous. Neck: No stridor.   Cardiovascular: Normal rate, regular rhythm. Good peripheral circulation. Grossly normal heart sounds. Respiratory: Normal respiratory effort.  No retractions. Lungs CTAB. Gastrointestinal: Soft and nontender. No distention.  Musculoskeletal: No lower extremity tenderness nor edema. No gross deformities of extremities. Neurologic:  Normal speech and language. No gross focal neurologic deficits are appreciated.  Skin:  Skin is warm, dry and intact. No rash noted.   ____________________________________________   LABS (all labs ordered are listed, but only abnormal results are displayed)  Labs Reviewed  INFLUENZA PANEL BY PCR (TYPE A & B, H1N1)    RADIOLOGY I, [place] Jerremy Maione, personally viewed and evaluated these images (plain radiographs) as part of my medical decision making, as well as reviewing the written report by the radiologist.  Dg Chest 2 View  Result Date: 08/24/2016 CLINICAL DATA:  Cough and fever for several days. EXAM: CHEST  2 VIEW COMPARISON:  06/06/2016 FINDINGS: Borderline heart size, unchanged. The lungs are clear. The pulmonary vasculature is normal. There is no pleural effusion. Hilar, mediastinal and cardiac contours are unremarkable and unchanged. IMPRESSION: No active cardiopulmonary disease. Electronically Signed   By: Andreas Newport M.D.   On: 08/24/2016 03:07     Procedures     INITIAL IMPRESSION / ASSESSMENT AND PLAN / ED COURSE  Pertinent labs & imaging results that were available during my care of the patient were reviewed by me and considered in my medical decision making (see chart for details).  50 year old female presents with cough fever generalized myalgias. Influenza negative chest x-ray revealed no evidence of pneumonia. Patient given Tussionex and azithromycin for concern for possible acute bronchitis in the emergency department. Will be prescribed same for home.   Clinical Course      ____________________________________________  FINAL CLINICAL IMPRESSION(S) / ED DIAGNOSES  Final diagnoses:  Acute bronchitis, unspecified organism     MEDICATIONS GIVEN DURING THIS VISIT:  Medications  ibuprofen (ADVIL,MOTRIN) tablet 600 mg (600 mg Oral Given 08/24/16 0307)  azithromycin (ZITHROMAX) tablet 500 mg (500 mg Oral Given 08/24/16 0404)  chlorpheniramine-HYDROcodone (TUSSIONEX) 10-8 MG/5ML suspension 5 mL (5 mLs Oral Given 08/24/16 0404)     NEW OUTPATIENT MEDICATIONS STARTED DURING THIS VISIT:  New Prescriptions   No medications on file    Modified Medications   No medications on file    Discontinued Medications   No medications on file     Note:  This document was prepared using Dragon voice recognition software and may include unintentional dictation errors.    Gregor Hams, MD 08/24/16 [phone]

## 2016-08-24 NOTE — ED Notes (Signed)

## 2016-08-25 ENCOUNTER — Emergency Department
Admission: EM | Admit: 2016-08-25 | Discharge: 2016-08-25 | Disposition: A | Discharge status: Home / Self Care | Payer: Self-pay | Attending: Emergency Medicine | Admitting: Emergency Medicine

## 2016-08-25 ENCOUNTER — Encounter: Payer: Self-pay | Admitting: Emergency Medicine

## 2016-08-25 DIAGNOSIS — R52 Pain, unspecified: Secondary | ICD-10-CM | POA: Insufficient documentation

## 2016-08-25 DIAGNOSIS — R0602 Shortness of breath: Secondary | ICD-10-CM | POA: Insufficient documentation

## 2016-08-25 DIAGNOSIS — R05 Cough: Secondary | ICD-10-CM | POA: Insufficient documentation

## 2016-08-25 DIAGNOSIS — Z79899 Other long term (current) drug therapy: Secondary | ICD-10-CM | POA: Insufficient documentation

## 2016-08-25 DIAGNOSIS — I509 Heart failure, unspecified: Secondary | ICD-10-CM | POA: Insufficient documentation

## 2016-08-25 DIAGNOSIS — J45909 Unspecified asthma, uncomplicated: Secondary | ICD-10-CM | POA: Insufficient documentation

## 2016-08-25 DIAGNOSIS — Z5321 Procedure and treatment not carried out due to patient leaving prior to being seen by health care provider: Secondary | ICD-10-CM | POA: Insufficient documentation

## 2016-08-25 NOTE — ED Triage Notes (Signed)
Pt presents to ED via EMS with c/o sob. Pt states she was seen in the ED on Tuesday night - Wednesday morning for the same and was prescribed z-pack and tussionex prior to discharge. Pt reports she has not been able to pick them up yet. Continued cough and body aches. Cough present in triage.

## 2016-09-30 IMAGING — CT CT RENAL STONE PROTOCOL
1 of 2 series · 15 of 32 positions shown, 19 images · non-contrast
Comparison: None.

CLINICAL DATA: Right-sided flank pain radiating to the right groin.
History of nausea and dysuria.

EXAM:
CT ABDOMEN AND PELVIS WITHOUT CONTRAST
TECHNIQUE: Multidetector CT imaging of the abdomen and pelvis was performed
following the standard protocol without IV contrast.

[Series 2: stone standard full · axial · 0.89mm/px · z∈[-1168,-758]mm · 15 of 90 slices shown, 19 images]
[im 4/90  soft-tissue]
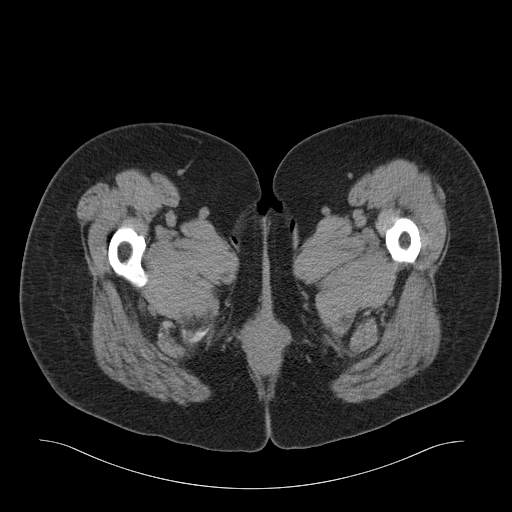
[im 4/90  bone]
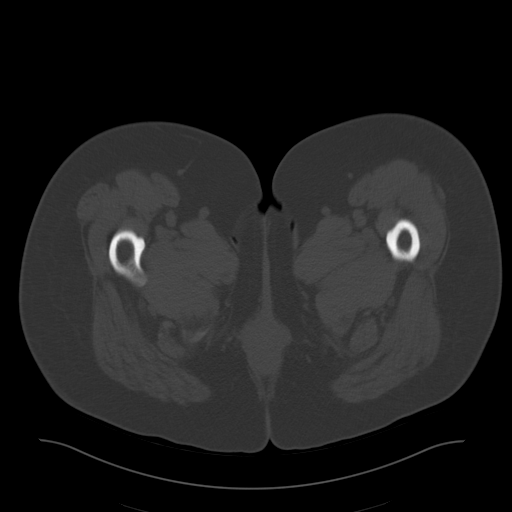
[im 11/90  soft-tissue]
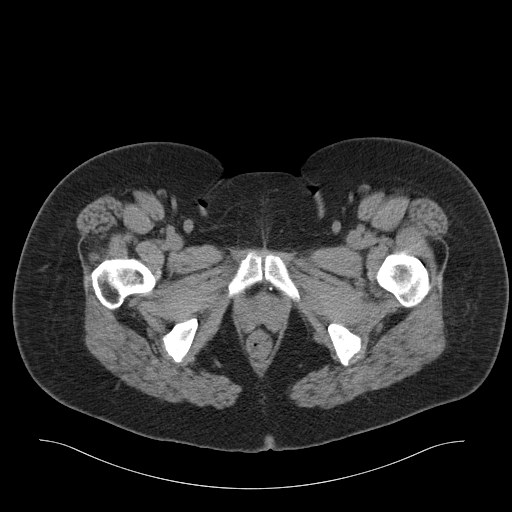
[im 18/90  soft-tissue]
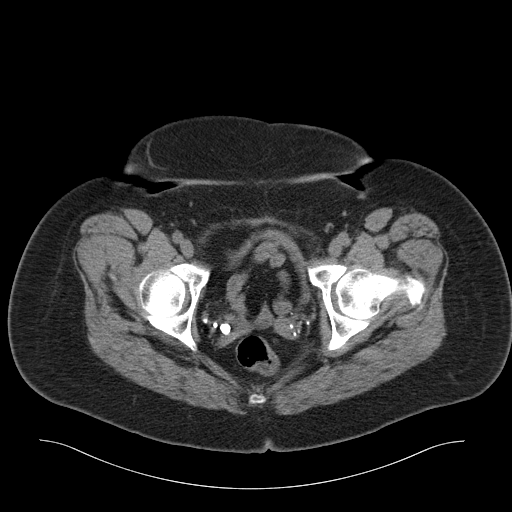
[im 25/90  soft-tissue]
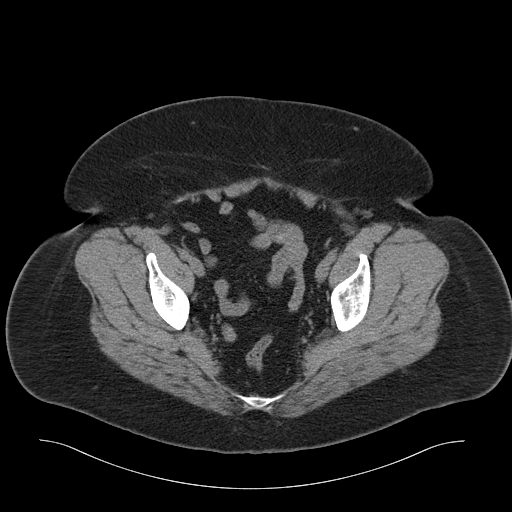
[im 33/90  soft-tissue]
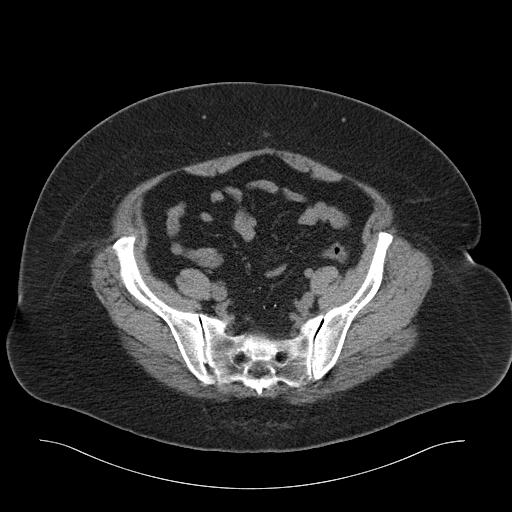
[im 40/90  soft-tissue]
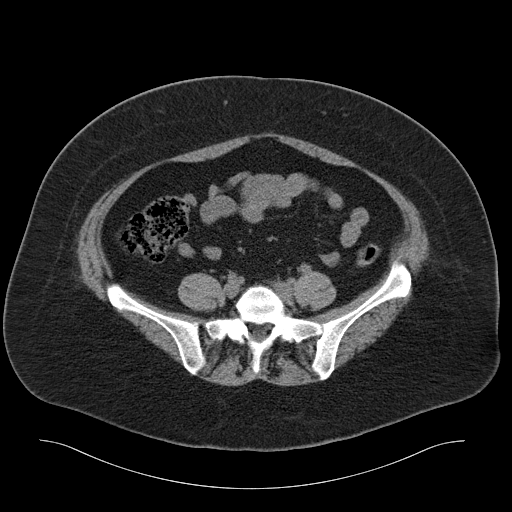
[im 47/90  soft-tissue]
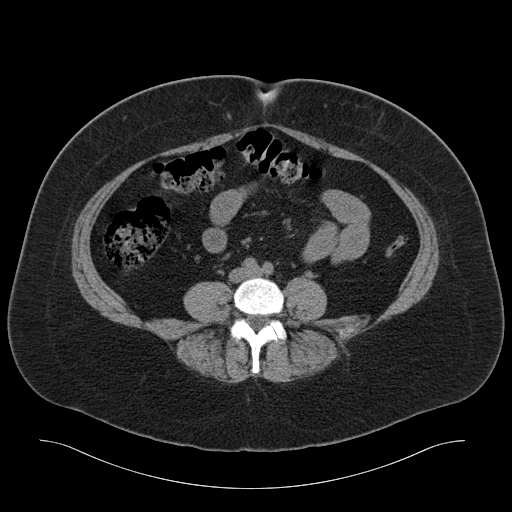
[im 50/90  soft-tissue]
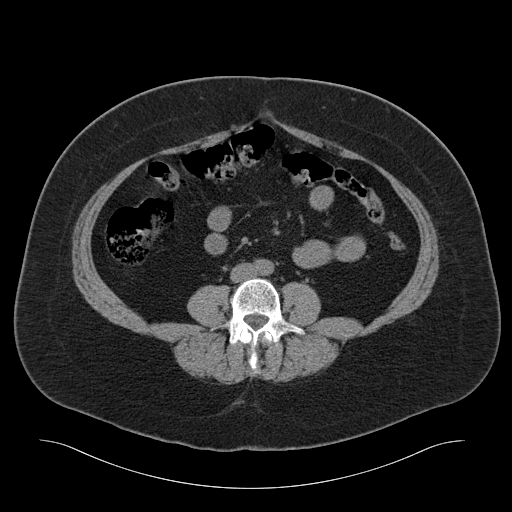
[im 57/90  soft-tissue]
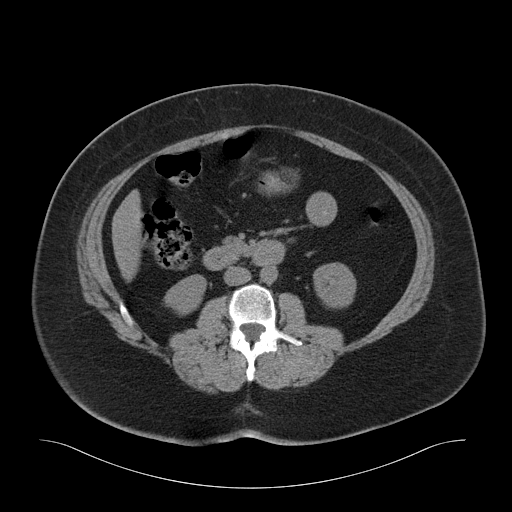
[im 57/90  bone]
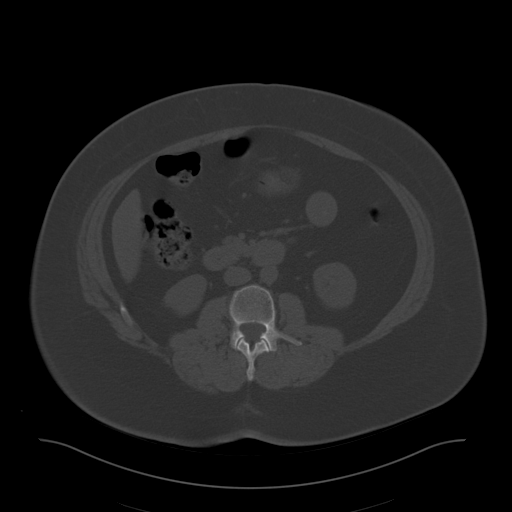
[im 65/90  soft-tissue]
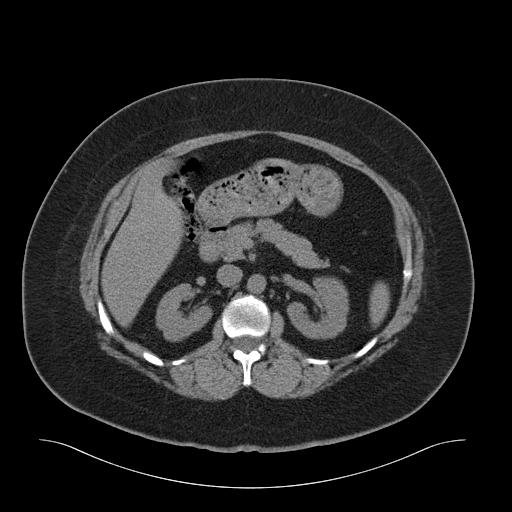
[im 72/90  soft-tissue]
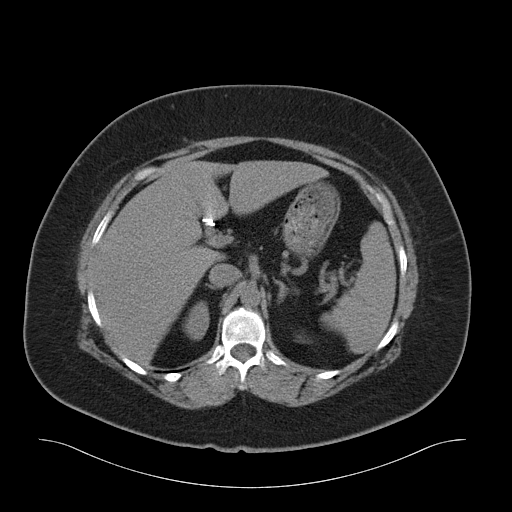
[im 75/90  lung]
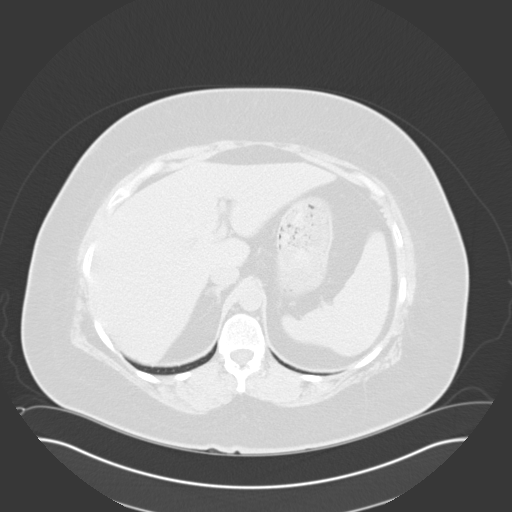
[im 79/90  soft-tissue]
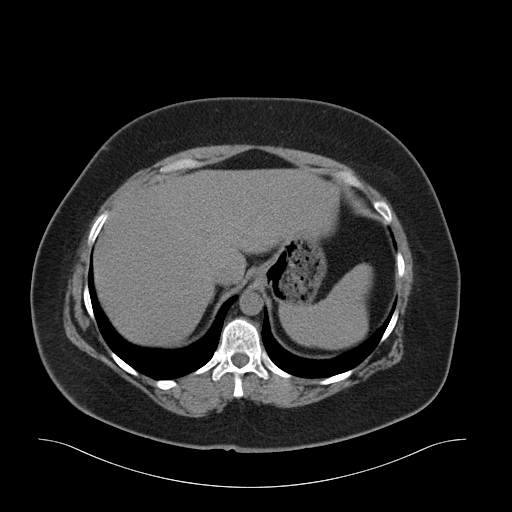
[im 79/90  lung]
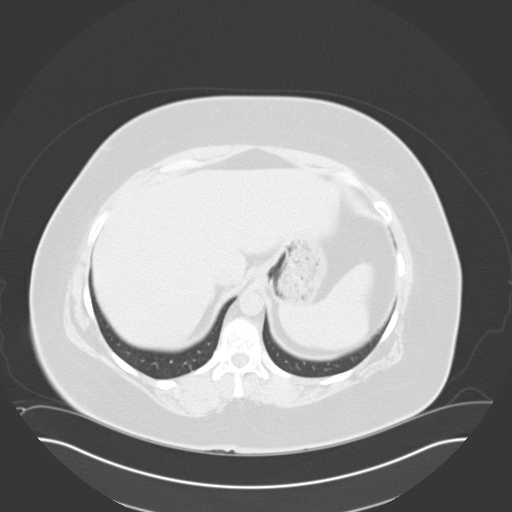
[im 82/90  lung]
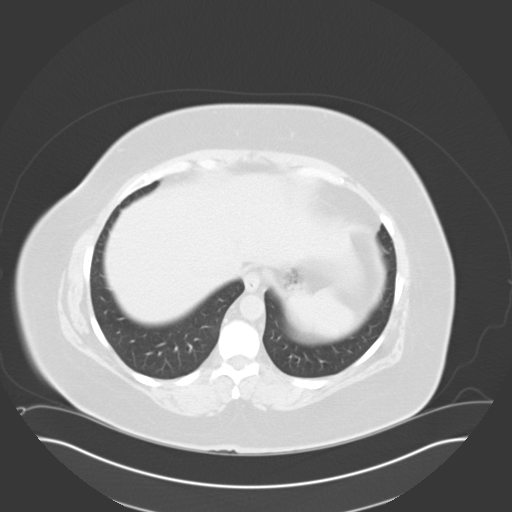
[im 86/90  soft-tissue]
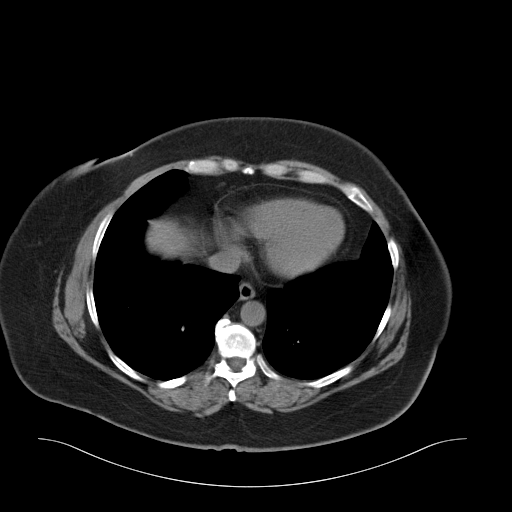
[im 86/90  lung]
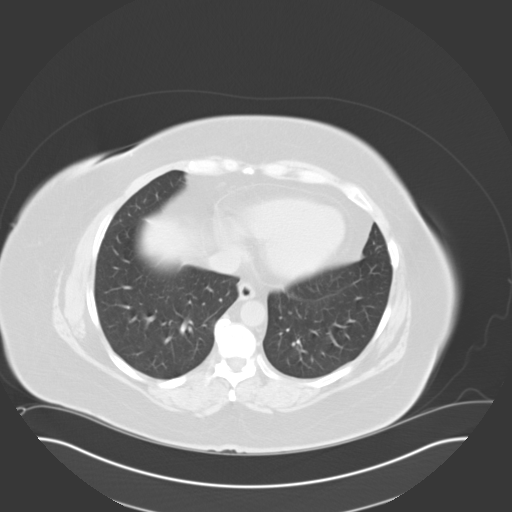

[15 of 32 positions shown; findings below may reference images not displayed]

FINDINGS: The lack of intravenous contrast limits the ability to evaluate
solid abdominal organs.

Normal noncontrast appearance of the bilateral kidneys. No renal
stones. No renal stones are seen along the expected course of either
ureter or the urinary bladder. Normal appearance of the urinary
bladder given underdistention. No urinary obstruction or perinephric
stranding.

Post hysterectomy. Multiple phleboliths are seen within the lower
pelvis bilaterally. No discrete adnexal lesion. No free fluid in the
pelvic cul-de-sac.

Normal hepatic contour.  Post cholecystectomy.  No ascites.

Normal noncontrast appearance the bilateral adrenal glands, pancreas
and spleen.

An enteric suture line is noted within the sigmoid colon. Descending
colon is underdistended but otherwise normal in appearance. No
evidence of enteric obstruction. Normal noncontrast appearance the
terminal ileum. The appendix is not visualized however there is no
pericecal inflammatory change. No pneumoperitoneum, pneumatosis or
portal venous gas.

Normal caliber of the abdominal aorta.

No bulky retroperitoneal, mesenteric, pelvic or inguinal
lymphadenopathy on this noncontrast examination.

Limited visualization of the lower thorax is negative for focal
airspace opacity or pleural effusion.

Normal heart size.  No pericardial effusion.

No acute or aggressive osseous abnormalities.

Tiny mesenteric fat containing periumbilical hernia. Regional soft
tissues appear otherwise normal.
IMPRESSION: 1. No explanation for patient's radiating right-sided flank pain.
Specifically, no evidence of nephrolithiasis, urinary or enteric
obstruction.
2. Postsurgical change of the sigmoid colon without evidence of
enteric obstruction.
3. Post cholecystectomy.

## 2016-10-04 IMAGING — CR DG CHEST 2V
2 series · 2 of 2 positions shown · non-contrast
Comparison: PA and lateral chest 10/03/2014.

CLINICAL DATA: Shortness of breath and left side pain radiating
into the neck. Initial encounter.

EXAM:
CHEST  2 VIEW

[chest pa]
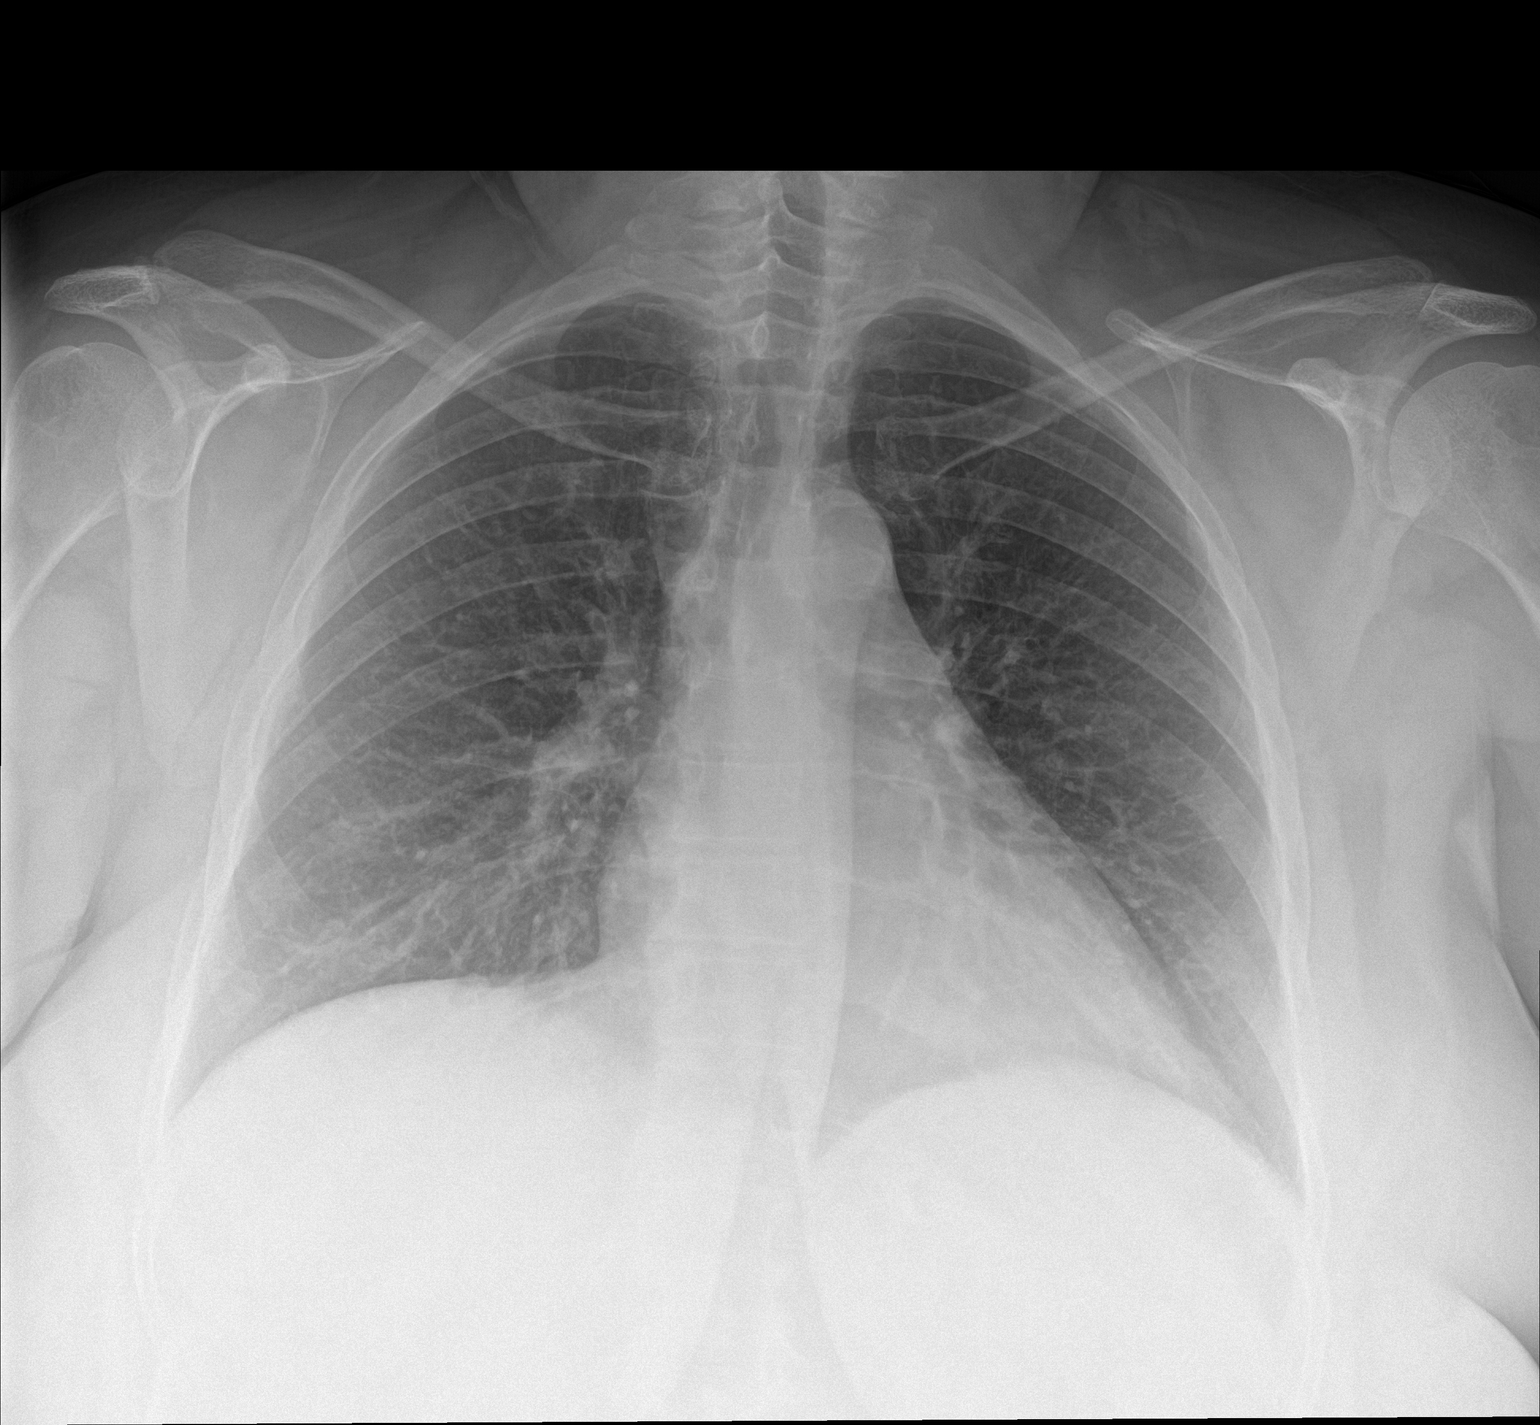

[chest lat]
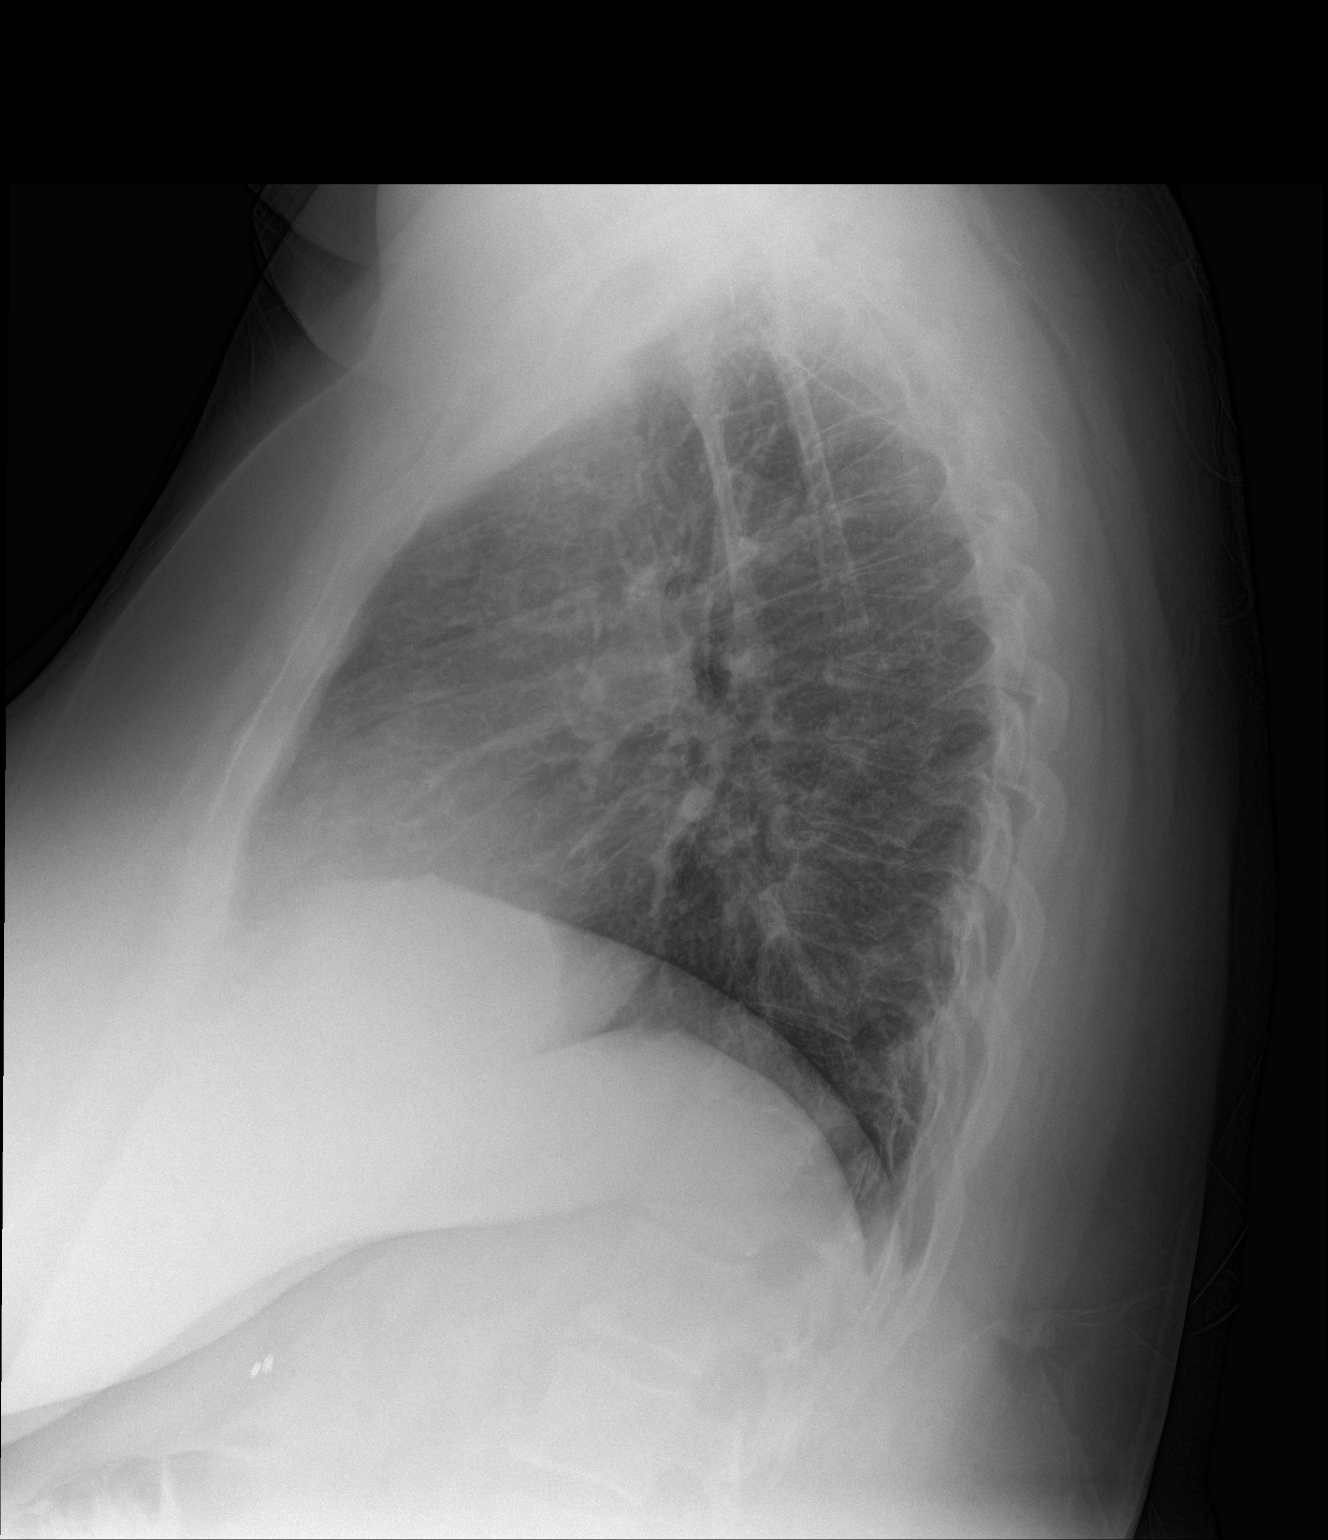

[2 of 2 positions shown; findings below may reference images not displayed]

FINDINGS: The lungs are clear. Heart size is normal. There is no pneumothorax
or pleural effusion. No focal bony abnormality.
IMPRESSION: Negative chest.

## 2016-11-22 IMAGING — CT CT ABD-PELV W/O CM
1 of 2 series · 15 of 32 positions shown, 19 images · non-contrast
Comparison: CT of the abdomen and pelvis performed 07/16/2015

CLINICAL DATA: Subacute onset of right-sided abdominal pain for 2-3
weeks, with nausea, vomiting and diarrhea. Initial encounter.

EXAM:
CT ABDOMEN AND PELVIS WITHOUT CONTRAST
TECHNIQUE: Multidetector CT imaging of the abdomen and pelvis was performed
following the standard protocol without IV contrast.

[Series 2: routine abd pel without · axial · non-contrast · 0.83mm/px · z∈[-506,-76]mm · 15 of 94 slices shown, 19 images]
[im 4/94  soft-tissue]
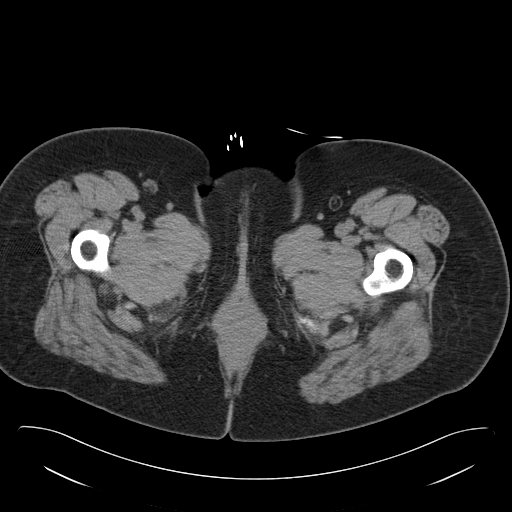
[im 4/94  bone]
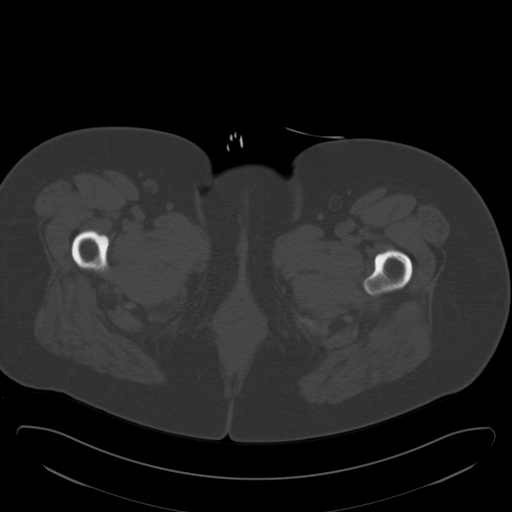
[im 12/94  soft-tissue]
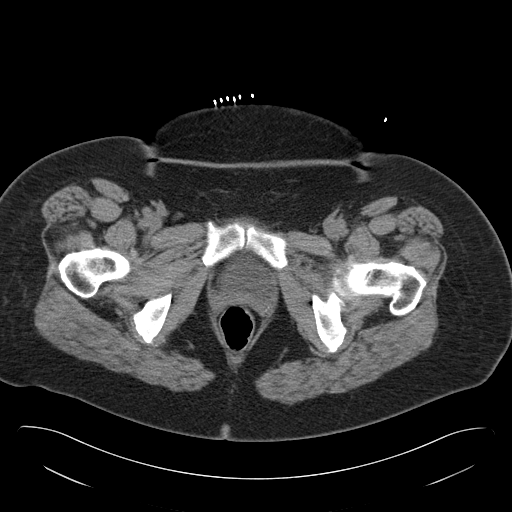
[im 20/94  soft-tissue]
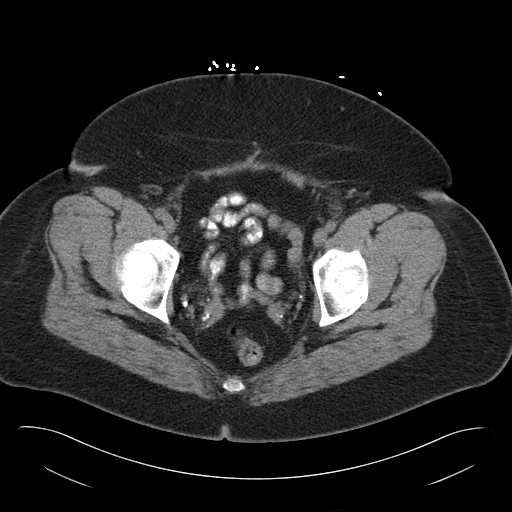
[im 28/94  soft-tissue]
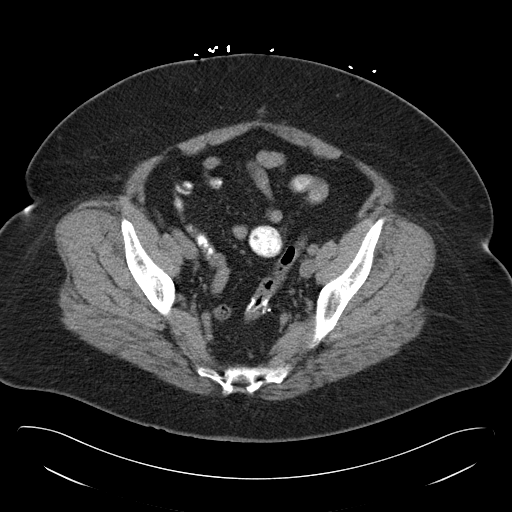
[im 32/94  soft-tissue]
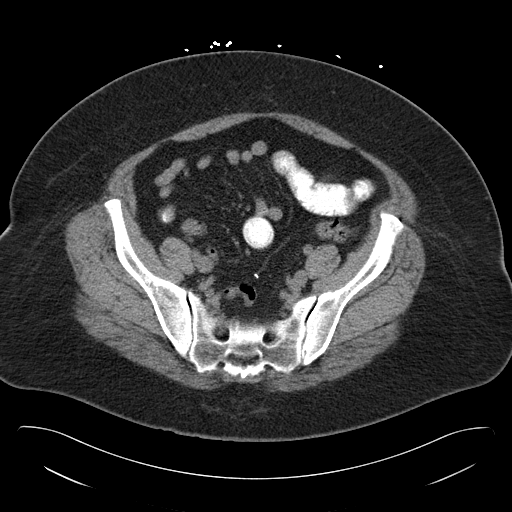
[im 39/94  soft-tissue]
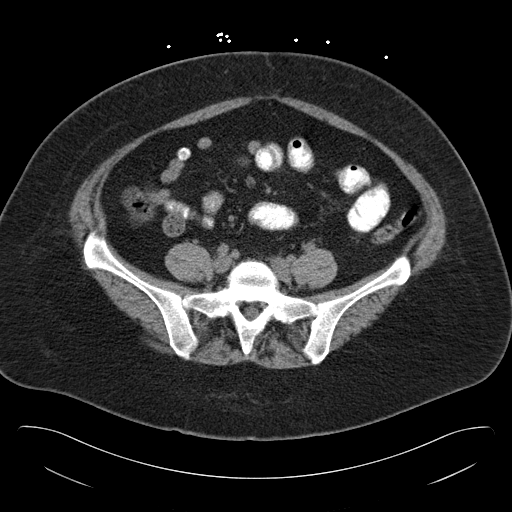
[im 47/94  soft-tissue]
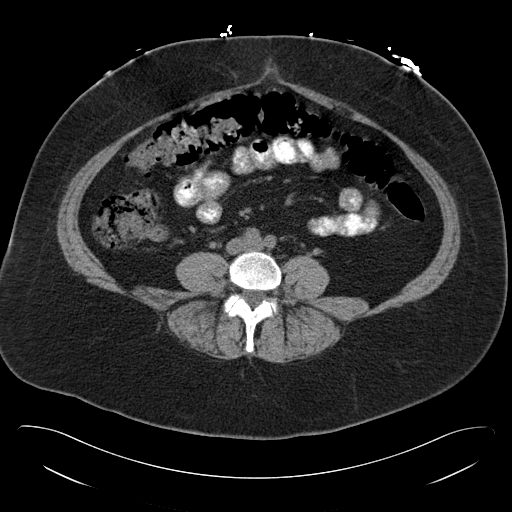
[im 55/94  soft-tissue]
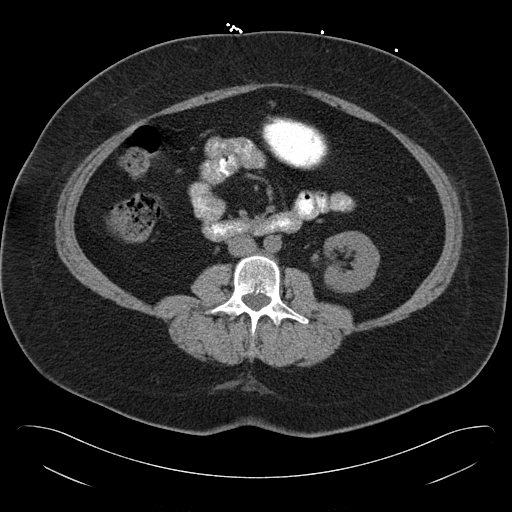
[im 63/94  soft-tissue]
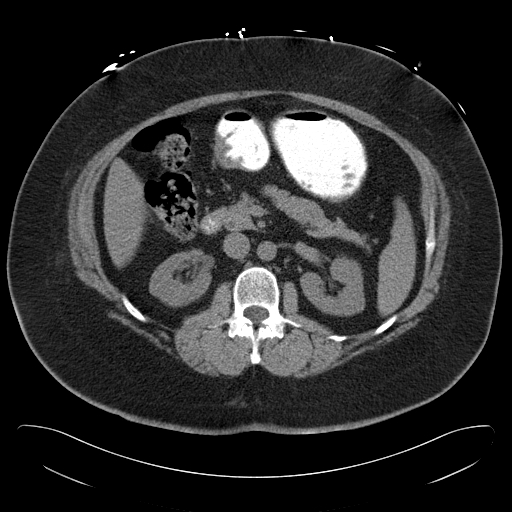
[im 63/94  bone]
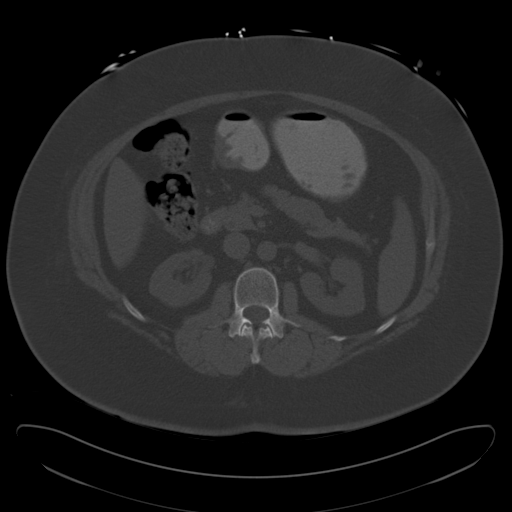
[im 66/94  soft-tissue]
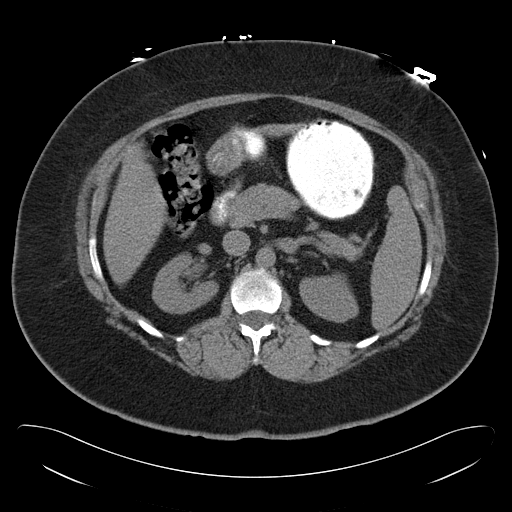
[im 74/94  soft-tissue]
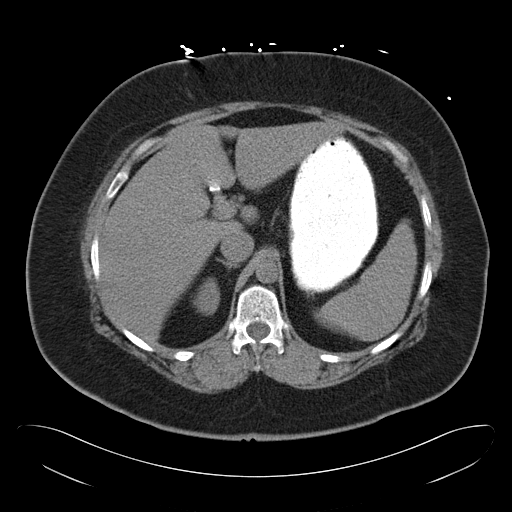
[im 78/94  lung]
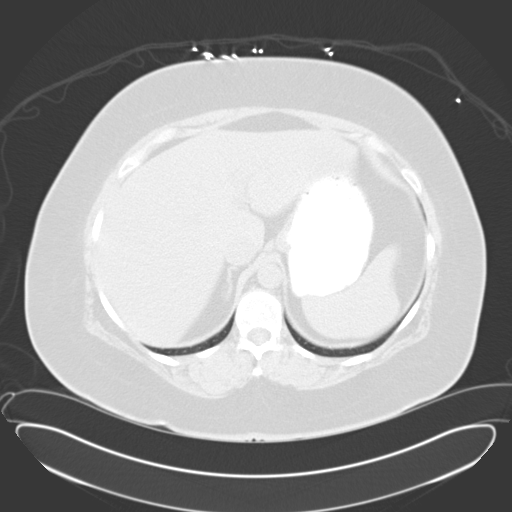
[im 82/94  soft-tissue]
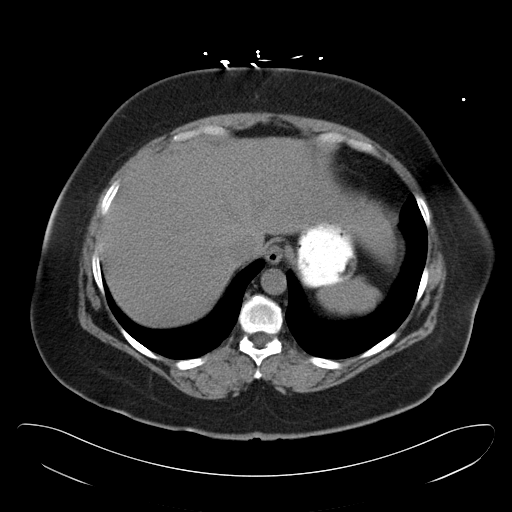
[im 82/94  lung]
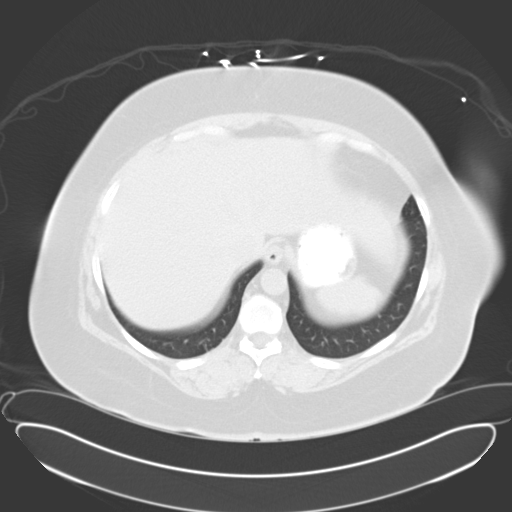
[im 86/94  lung]
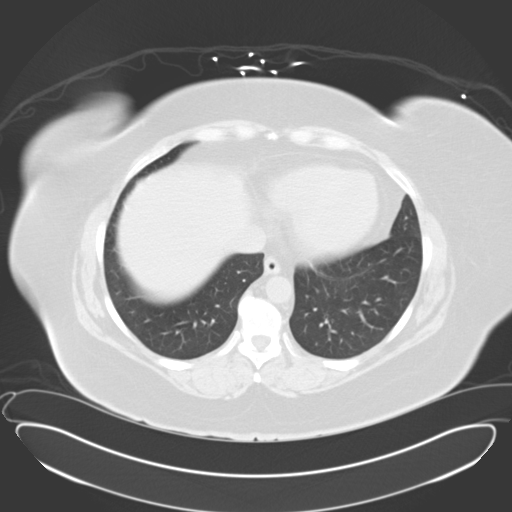
[im 90/94  soft-tissue]
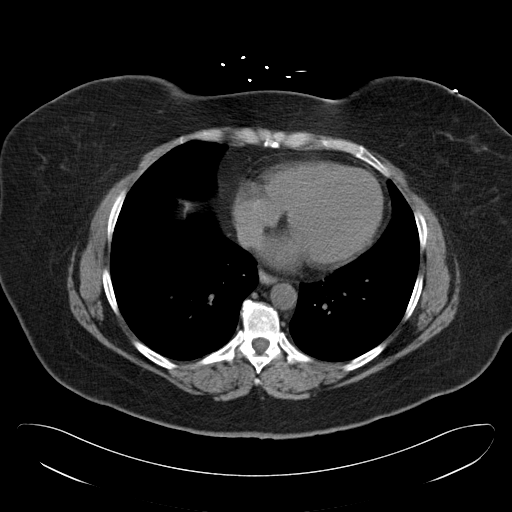
[im 90/94  lung]
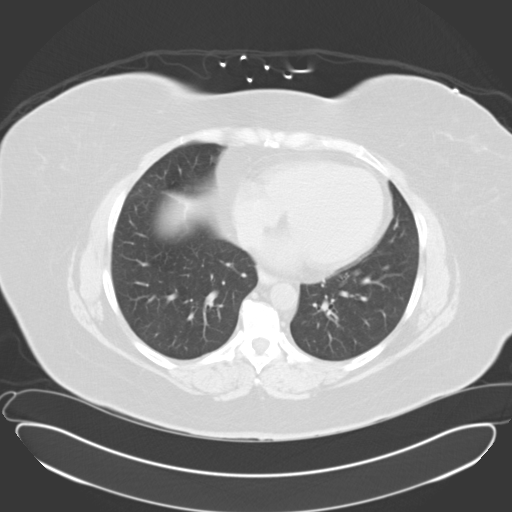

[15 of 32 positions shown; findings below may reference images not displayed]

FINDINGS: The visualized lung bases are clear.

The liver and spleen are unremarkable in appearance. The patient is
status post cholecystectomy, with clips noted at the gallbladder
fossa. The pancreas and adrenal glands are unremarkable.

The kidneys are unremarkable in appearance. There is no evidence of
hydronephrosis. No renal or ureteral stones are seen. No perinephric
stranding is appreciated.

No free fluid is identified. The small bowel is unremarkable in
appearance. The stomach is within normal limits. No acute vascular
abnormalities are seen.

The appendix is not definitely seen; there is no evidence of
appendicitis. A bowel suture line is noted at the sigmoid colon. The
colon is unremarkable in appearance.

The bladder is mildly distended and grossly unremarkable. The
patient is status post hysterectomy. No suspicious adnexal masses
are seen. No inguinal lymphadenopathy is seen.

No acute osseous abnormalities are identified.
IMPRESSION: No acute abnormality seen within the abdomen or pelvis.

## 2016-12-04 IMAGING — CR DG CHEST 1V PORT
1 series · 1 of 1 positions shown · non-contrast
Comparison: Radiograph dated 07/20/2015

CLINICAL DATA: 49-year-old female with chest pain and shortness of
breath.

EXAM:
PORTABLE CHEST 1 VIEW

[portable]
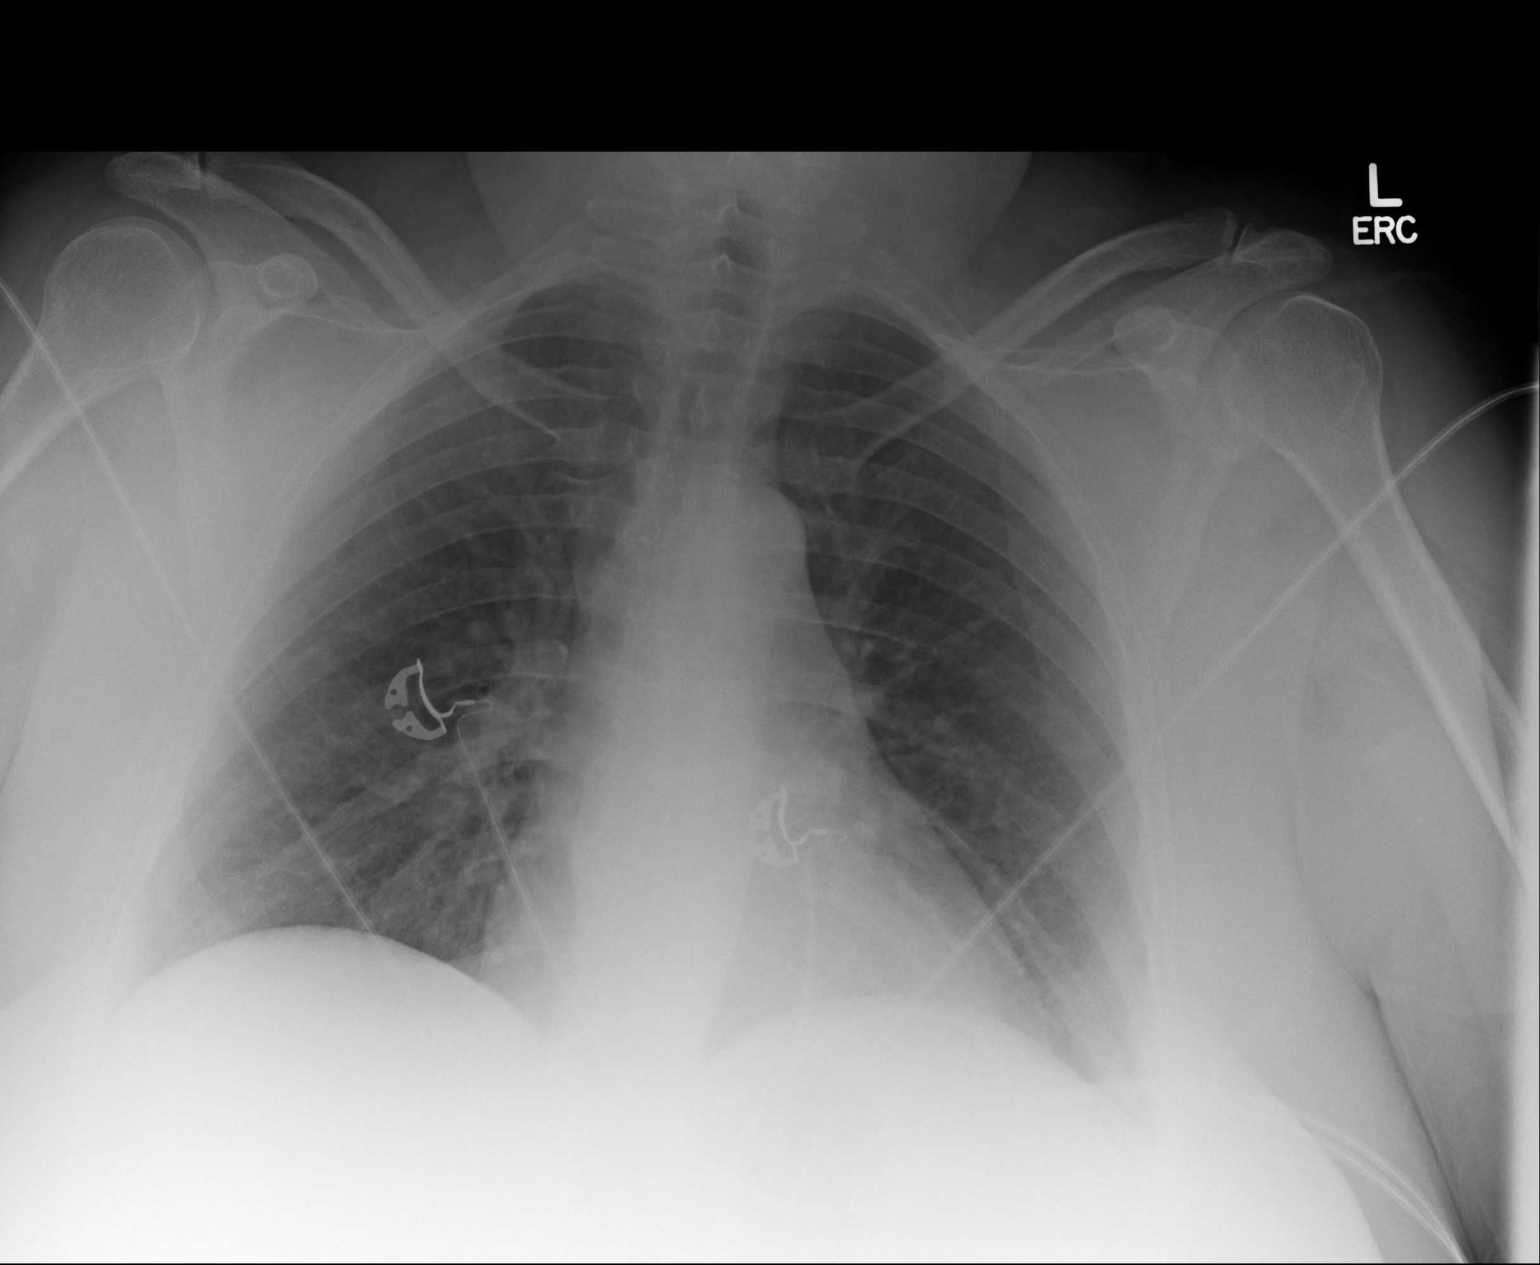

[1 of 1 positions shown; findings below may reference images not displayed]

FINDINGS: The heart size and mediastinal contours are within normal limits.
Both lungs are clear. The visualized skeletal structures are
unremarkable.
IMPRESSION: No active disease.

## 2016-12-04 IMAGING — NM NM PULMONARY VENT & PERF
2 series · 16 of 16 positions shown · non-contrast
Comparison: Chest x-ray 09/19/2015

CLINICAL DATA: Fever, cough and difficulty taking deep breath.
Anxiety.

EXAM:
NUCLEAR MEDICINE VENTILATION - PERFUSION LUNG SCAN
TECHNIQUE: Ventilation images were obtained in multiple projections using
inhaled aerosol 7c-EEm DTPA. Perfusion images were obtained in
multiple projections after intravenous injection of 7c-EEm MAA.
RADIOPHARMACEUTICALS:  31.85 mCi 0echnetium-YYm DTPA aerosol
inhalation and 4.08 mCi of 0echnetium-YYm MAA IV

[Series 1000: lung ventilation · 3.90mm/px · 4 acquisitions, 8 frames shown]
[im 1/4]
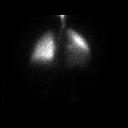
[im 1/4]
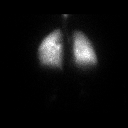
[im 2/4]
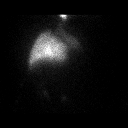
[im 2/4]
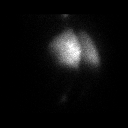
[im 3/4]
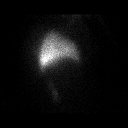
[im 3/4]
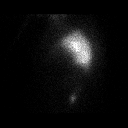
[im 4/4]
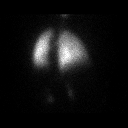
[im 4/4]
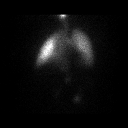

[Series 1000: lung perfusion · 1.95mm/px · 4 acquisitions, 8 frames shown]
[im 1/4]
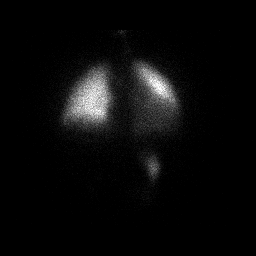
[im 1/4]
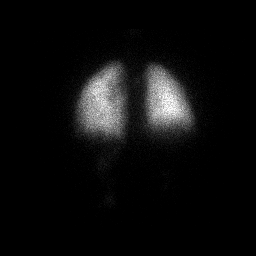
[im 2/4]
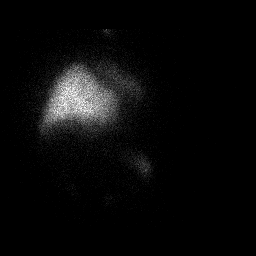
[im 2/4]
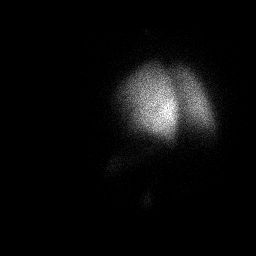
[im 3/4  full-range]
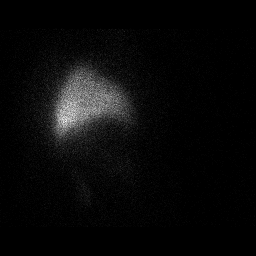
[im 3/4  full-range]
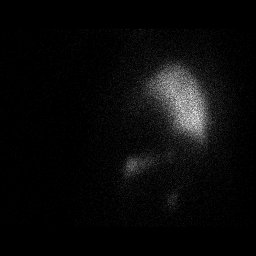
[im 4/4]
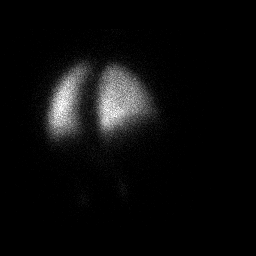
[im 4/4]
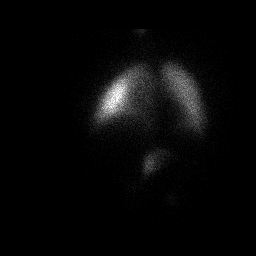

[16 of 16 positions shown; findings below may reference images not displayed]

FINDINGS: Ventilation: No focal ventilation defect.

Perfusion: No wedge shaped peripheral perfusion defects to suggest
acute pulmonary embolism.
IMPRESSION: Normal ventilation perfusion lung scan.

## 2016-12-08 ENCOUNTER — Emergency Department: Payer: Medicaid Other

## 2016-12-08 ENCOUNTER — Encounter: Payer: Self-pay | Admitting: Emergency Medicine

## 2016-12-08 ENCOUNTER — Observation Stay
Admission: EM | Admit: 2016-12-08 | Discharge: 2016-12-09 | Disposition: A | Discharge status: Home / Self Care | Payer: Medicaid Other | Attending: Internal Medicine | Admitting: Internal Medicine

## 2016-12-08 DIAGNOSIS — I5022 Chronic systolic (congestive) heart failure: Secondary | ICD-10-CM | POA: Insufficient documentation

## 2016-12-08 DIAGNOSIS — Z6839 Body mass index (BMI) 39.0-39.9, adult: Secondary | ICD-10-CM | POA: Insufficient documentation

## 2016-12-08 DIAGNOSIS — J45909 Unspecified asthma, uncomplicated: Secondary | ICD-10-CM | POA: Diagnosis not present

## 2016-12-08 DIAGNOSIS — K219 Gastro-esophageal reflux disease without esophagitis: Secondary | ICD-10-CM | POA: Diagnosis not present

## 2016-12-08 DIAGNOSIS — R079 Chest pain, unspecified: Secondary | ICD-10-CM | POA: Diagnosis present

## 2016-12-08 DIAGNOSIS — Z8614 Personal history of Methicillin resistant Staphylococcus aureus infection: Secondary | ICD-10-CM | POA: Diagnosis not present

## 2016-12-08 DIAGNOSIS — I5023 Acute on chronic systolic (congestive) heart failure: Secondary | ICD-10-CM | POA: Diagnosis present

## 2016-12-08 DIAGNOSIS — G2581 Restless legs syndrome: Secondary | ICD-10-CM | POA: Diagnosis present

## 2016-12-08 DIAGNOSIS — Z86718 Personal history of other venous thrombosis and embolism: Secondary | ICD-10-CM | POA: Diagnosis not present

## 2016-12-08 LAB — BASIC METABOLIC PANEL
ANION GAP: 6 (ref 5–15)
BUN: 11 mg/dL (ref 6–20)
CO2: 25 mmol/L (ref 22–32)
CREATININE: 0.76 mg/dL (ref 0.44–1.00)
Calcium: 8.8 mg/dL — ABNORMAL LOW (ref 8.9–10.3)
Chloride: 105 mmol/L (ref 101–111)
GLUCOSE: 141 mg/dL — AB (ref 65–99)
Potassium: 3.4 mmol/L — ABNORMAL LOW (ref 3.5–5.1)
Sodium: 136 mmol/L (ref 135–145)

## 2016-12-08 LAB — BRAIN NATRIURETIC PEPTIDE: B NATRIURETIC PEPTIDE 5: 233 pg/mL — AB (ref 0.0–100.0)

## 2016-12-08 LAB — CBC
HCT: 36.2 % (ref 35.0–47.0)
HEMOGLOBIN: 12.3 g/dL (ref 12.0–16.0)
MCH: 27.4 pg (ref 26.0–34.0)
MCHC: 33.9 g/dL (ref 32.0–36.0)
MCV: 80.8 fL (ref 80.0–100.0)
PLATELETS: 269 10*3/uL (ref 150–440)
RBC: 4.48 MIL/uL (ref 3.80–5.20)
RDW: 16 % — ABNORMAL HIGH (ref 11.5–14.5)
WBC: 8.3 10*3/uL (ref 3.6–11.0)

## 2016-12-08 LAB — TROPONIN I: Troponin I: <0.03 ng/mL (ref <0.03)

## 2016-12-08 LAB — FIBRIN DERIVATIVES D-DIMER (ARMC ONLY): Fibrin derivatives D-dimer (ARMC): 471.95 (ref 0.00–499.00)

## 2016-12-08 MED ORDER — GI COCKTAIL ~~LOC~~
30.0000 mL | Freq: Once | ORAL | Status: AC
  Administered 2016-12-08: 30 mL via ORAL
  Filled 2016-12-08: qty 30

## 2016-12-08 MED ORDER — ACETAMINOPHEN 500 MG PO TABS
ORAL_TABLET | ORAL | Status: AC
  Filled 2016-12-08: qty 2

## 2016-12-08 MED ORDER — ACETAMINOPHEN 500 MG PO TABS
1000.0000 mg | ORAL_TABLET | Freq: Once | ORAL | Status: AC
  Administered 2016-12-08: 1000 mg via ORAL

## 2016-12-08 MED ORDER — LORAZEPAM 2 MG/ML IJ SOLN
1.0000 mg | Freq: Once | INTRAMUSCULAR | Status: AC
  Administered 2016-12-08: 1 mg via INTRAVENOUS
  Filled 2016-12-08: qty 1

## 2016-12-08 NOTE — ED Notes (Signed)
Patient c/o intermittent central chest pain described as squeezing/heaviness/dull/ache, with periods of sharp/stabbing with inspiration. Pt has radiation to left shoulder/hand, back, neck and jaw. Pt c/o associated symptoms of N/V, SOB, weakness, fatigue and light-headedness.   Patient reports she was admitted to [facility] for CHF exacerbation on Christmas day.

## 2016-12-08 NOTE — ED Triage Notes (Signed)
Pt to triage via w/c, frequent cough noted; pt reports since Tuesday pm, having left sided CP radiating into jaw and arm accomp by weakness, SHOB, nonprod cough and swelling to feet; st hx CHF

## 2016-12-08 NOTE — ED Provider Notes (Signed)
[facility] Emergency Department Provider Note  ____________________________________________   I have reviewed the triage vital signs and the nursing notes.   HISTORY  Chief Complaint Chest Pain   History limited by: Not Limited   HPI Brittany Nguyen is a 51 y.o. female who presents to the emergency department today because of concerns for chest pain. It has been going on for the past few days. Starts in the center chest with some radiation up into her left neck and jaw. She states it feels like somebody is wrapped arms around her and is squeezing type. Has been accompanied by some shortness of breath. She has also had some cough. No blood in the cough. The patient denies any fever this past couple days. Patient has had similar symptoms in the past and has been seen in this emergency department for similar symptoms in the past. Patient husband states that she stays slightly with a fast heart rate and actually was prescribed medication for this at one point however is no longer taking that.  Past Medical History:  Diagnosis Date  . Asthma 2013  . CHF (congestive heart failure) ([place])   . Diverticulitis 2010  . DVT (deep venous thrombosis) ([place])   . Endometriosis 1990  . Heart disease 2013  . Hx MRSA infection   . Lump or mass in breast   . Restless leg     Patient Active Problem List   Diagnosis Date Noted  . Acute bronchitis 11/22/2015  . Cough, persistent 11/22/2015  . Hypokalemia 11/22/2015  . Leukocytosis 11/22/2015  . Dyspnea 11/21/2015  . Respiratory distress 09/19/2015  . Breast pain 01/11/2013  . Hx MRSA infection   . Lump or mass in breast   . GOITER, MULTINODULAR 01/20/2009  . HYPOGLYCEMIA, UNSPECIFIED 01/20/2009  . GERD 01/20/2009  . DIVERTICULITIS OF COLON 01/20/2009    Past Surgical History:  Procedure Laterality Date  . ABDOMINAL HYSTERECTOMY     age 73  . CESAREAN SECTION    . CHOLECYSTECTOMY    . COLECTOMY    . NASAL  SINUS SURGERY  2012    Prior to Admission medications   Medication Sig Start Date End Date Taking? Authorizing Provider  albuterol (PROVENTIL HFA;VENTOLIN HFA) 108 (90 Base) MCG/ACT inhaler Inhale 2 puffs into the lungs every 6 (six) hours as needed for wheezing or shortness of breath. 06/06/16   Rudene Re, MD  chlorpheniramine-HYDROcodone [facility]) 10-8 MG/5ML SUER Take 5 mLs by mouth 2 (two) times daily. 06/06/16   Rudene Re, MD  chlorpheniramine-HYDROcodone [facility]) 10-8 MG/5ML SUER Take 5 mLs by mouth every 12 (twelve) hours as needed for cough. 08/24/16   Gregor Hams, MD  rOPINIRole (REQUIP) 2 MG tablet Take 1 tablet (2 mg total) by mouth at bedtime. Patient taking differently: Take 4 mg by mouth at bedtime.  05/14/15   Johnn Hai, PA-C    Allergies Contrast media [iodinated diagnostic agents]; Latex; Ondansetron; Penicillins; and Povidone-iodine  Family History  Problem Relation Age of Onset  . Cancer Mother 30    ovarian  . Cancer Father 59    brain  . Cancer Daughter 18    skin  . Cancer Maternal Aunt 25    breast    Social History Social History  Substance Use Topics  . Smoking status: Never Smoker  . Smokeless tobacco: Never Used  . Alcohol use No    Review of Systems  Constitutional: Negative for fever. Cardiovascular: Positive for  chest pain. Respiratory: Positive for shortness of breath. Gastrointestinal: Negative for abdominal pain, vomiting and diarrhea. Neurological: Negative for headaches, focal weakness or numbness.  10-point ROS otherwise negative.  ____________________________________________   PHYSICAL EXAM:  VITAL SIGNS: ED Triage Vitals  Enc Vitals Group     BP 12/08/16 2000 132/72     Pulse Rate 12/08/16 2000 (!) 110     Resp 12/08/16 2053 16     Temp 12/08/16 2000 98 F (36.7 C)     Temp Source 12/08/16 2000 Oral     SpO2 12/08/16 2000 100 %     Weight 12/08/16 1959 200 lb  (90.7 kg)     Height 12/08/16 1959 5\' 5"  (1.651 m)     Head Circumference --      Peak Flow --      Pain Score 12/08/16 1959 9    Constitutional: Alert and oriented. Well appearing and in no distress. Eyes: Conjunctivae are normal. Normal extraocular movements. ENT   Head: Normocephalic and atraumatic.   Nose: No congestion/rhinnorhea.   Mouth/Throat: Mucous membranes are moist.   Neck: No stridor. Hematological/Lymphatic/Immunilogical: No cervical lymphadenopathy. Cardiovascular: Tachycardic, regular rhythm.  No murmurs, rubs, or gallops. Respiratory: Normal respiratory effort without tachypnea nor retractions. Breath sounds are clear and equal bilaterally. No wheezes/rales/rhonchi. Gastrointestinal: Soft and non tender. No rebound. No guarding.  Genitourinary: Deferred Musculoskeletal: Normal range of motion in all extremities. No lower extremity edema. Neurologic:  Normal speech and language. No gross focal neurologic deficits are appreciated.  Skin:  Skin is warm, dry and intact. No rash noted. Psychiatric: Mood and affect are normal. Speech and behavior are normal. Patient exhibits appropriate insight and judgment.  ____________________________________________    LABS (pertinent positives/negatives)  Labs Reviewed  BASIC METABOLIC PANEL - Abnormal; Notable for the following:       Result Value   Potassium 3.4 (*)    Glucose, Bld 141 (*)    Calcium 8.8 (*)    All other components within normal limits  CBC - Abnormal; Notable for the following:    RDW 16.0 (*)    All other components within normal limits  TROPONIN I  BRAIN NATRIURETIC PEPTIDE    ____________________________________________   EKG  I, Nance Pear, attending physician, personally viewed and interpreted this EKG  EKG Time: 1957 Rate: 111 Rhythm: sinus tachycardia with PVC Axis: left axis deviation Intervals: qtc 492 QRS: incomplete RBBB ST changes: no st elevation Impression:  abnormal ekg   ____________________________________________    RADIOLOGY  CXR IMPRESSION: No active cardiopulmonary disease.  ____________________________________________   PROCEDURES  Procedures  ____________________________________________   INITIAL IMPRESSION / ASSESSMENT AND PLAN / ED COURSE  Pertinent labs & imaging results that were available during my care of the patient were reviewed by me and considered in my medical decision making (see chart for details).  Patient to ed with chest pain. Initial troponin negative. EKG and chest x-ray without concerning results. Given persistent tachycardia will check D-dimer.   ____________________________________________   FINAL CLINICAL IMPRESSION(S) / ED DIAGNOSES  Chest pain  Note: This dictation was prepared with Dragon dictation. Any transcriptional errors that result from this process are unintentional     Nance Pear, MD 12/09/16 [phone]

## 2016-12-09 ENCOUNTER — Observation Stay
Admit: 2016-12-09 | Discharge: 2016-12-09 | Disposition: A | Discharge status: Home / Self Care | Payer: Medicaid Other | Attending: Internal Medicine | Admitting: Internal Medicine

## 2016-12-09 DIAGNOSIS — G2581 Restless legs syndrome: Secondary | ICD-10-CM | POA: Diagnosis present

## 2016-12-09 DIAGNOSIS — R079 Chest pain, unspecified: Secondary | ICD-10-CM | POA: Diagnosis present

## 2016-12-09 DIAGNOSIS — I5023 Acute on chronic systolic (congestive) heart failure: Secondary | ICD-10-CM | POA: Diagnosis present

## 2016-12-09 LAB — BASIC METABOLIC PANEL
Anion gap: 6 (ref 5–15)
BUN: 12 mg/dL (ref 6–20)
CO2: 27 mmol/L (ref 22–32)
CREATININE: 0.95 mg/dL (ref 0.44–1.00)
Calcium: 8.6 mg/dL — ABNORMAL LOW (ref 8.9–10.3)
Chloride: 106 mmol/L (ref 101–111)
GFR calc Af Amer: >60 mL/min (ref >60)
Glucose, Bld: 98 mg/dL (ref 65–99)
POTASSIUM: 3.7 mmol/L (ref 3.5–5.1)
SODIUM: 139 mmol/L (ref 135–145)

## 2016-12-09 LAB — CBC
HEMATOCRIT: 35.2 % (ref 35.0–47.0)
Hemoglobin: 11.9 g/dL — ABNORMAL LOW (ref 12.0–16.0)
MCH: 27.4 pg (ref 26.0–34.0)
MCHC: 33.8 g/dL (ref 32.0–36.0)
MCV: 81.2 fL (ref 80.0–100.0)
Platelets: 248 10*3/uL (ref 150–440)
RBC: 4.33 MIL/uL (ref 3.80–5.20)
RDW: 15.6 % — AB (ref 11.5–14.5)
WBC: 7.1 10*3/uL (ref 3.6–11.0)

## 2016-12-09 LAB — ECHOCARDIOGRAM COMPLETE
Height: 65 in
Weight: 3766.4 oz

## 2016-12-09 LAB — TROPONIN I: Troponin I: <0.03 ng/mL (ref <0.03)

## 2016-12-09 LAB — MRSA PCR SCREENING: MRSA by PCR: NEGATIVE

## 2016-12-09 MED ORDER — SODIUM CHLORIDE 0.9% FLUSH
3.0000 mL | Freq: Two times a day (BID) | INTRAVENOUS | Status: DC
  Administered 2016-12-09 (×2): 3 mL via INTRAVENOUS

## 2016-12-09 MED ORDER — MORPHINE SULFATE (PF) 2 MG/ML IV SOLN
2.0000 mg | Freq: Once | INTRAVENOUS | Status: AC
  Administered 2016-12-09: 2 mg via INTRAVENOUS

## 2016-12-09 MED ORDER — MORPHINE SULFATE (PF) 2 MG/ML IV SOLN
INTRAVENOUS | Status: AC
  Administered 2016-12-09: 2 mg via INTRAVENOUS
  Filled 2016-12-09: qty 1

## 2016-12-09 MED ORDER — ROPINIROLE HCL 1 MG PO TABS: 4.0000 mg | ORAL_TABLET | Freq: Every day | ORAL | Status: DC

## 2016-12-09 MED ORDER — ACETAMINOPHEN 650 MG RE SUPP: 650.0000 mg | Freq: Four times a day (QID) | RECTAL | Status: DC | PRN

## 2016-12-09 MED ORDER — ASPIRIN 81 MG PO CHEW: 81.0000 mg | CHEWABLE_TABLET | Freq: Every day | ORAL | Status: DC

## 2016-12-09 MED ORDER — OXYCODONE HCL 5 MG PO TABS
5.0000 mg | ORAL_TABLET | ORAL | Status: DC | PRN
  Administered 2016-12-09 (×2): 5 mg via ORAL
  Filled 2016-12-09 (×2): qty 1

## 2016-12-09 MED ORDER — ENOXAPARIN SODIUM 40 MG/0.4ML ~~LOC~~ SOLN: 40.0000 mg | SUBCUTANEOUS | Status: DC

## 2016-12-09 MED ORDER — PROMETHAZINE HCL 25 MG/ML IJ SOLN: 12.5000 mg | Freq: Four times a day (QID) | INTRAMUSCULAR | Status: DC | PRN

## 2016-12-09 MED ORDER — ASPIRIN 81 MG PO CHEW: 81.0000 mg | CHEWABLE_TABLET | Freq: Every day | ORAL | 0 refills | Status: DC

## 2016-12-09 MED ORDER — ACETAMINOPHEN 325 MG PO TABS
650.0000 mg | ORAL_TABLET | Freq: Four times a day (QID) | ORAL | Status: DC | PRN
Start: 2016-12-09 — End: 2016-12-09
  Administered 2016-12-09: 650 mg via ORAL
  Filled 2016-12-09: qty 2

## 2016-12-09 MED ORDER — METOCLOPRAMIDE HCL 5 MG/ML IJ SOLN
INTRAMUSCULAR | Status: AC
  Filled 2016-12-09: qty 2

## 2016-12-09 MED ORDER — METOCLOPRAMIDE HCL 5 MG/ML IJ SOLN
10.0000 mg | Freq: Once | INTRAMUSCULAR | Status: AC
  Administered 2016-12-09: 10 mg via INTRAVENOUS

## 2016-12-09 NOTE — Consult Note (Signed)
[facility] Consultation Note  Patient ID: Brittany Nguyen, MRN: [id number], DOB/AGE: 02-14-1966 51 y.o. Admit date: 12/08/2016   Date of Consult: 12/09/2016 Primary Physician: Brittany Pink, MD Primary Cardiologist: Brittany Nguyen  Chief Complaint:  Chief Complaint  Patient presents with  . Chest Pain   Reason for Consult: chest pain  HPI: 51 y.o. female with the known moderate cyst LV systolic dysfunction with a previous cardiac catheterization showing no obstructive coronary artery disease and previous history of preventricular contractions as well as palpitations and SVT but no evidence of significant malignant tachycardia has sleep apnea. The patient has recently been admitted to the [facility] in December for further evaluation of which the patient had significant chest pain and shortness of breath. The patient at that time had an echo cardiac echocardiogram showing moderate LV systolic dysfunction with ejection fraction of 35% and no evidence of myocardial infarction since then she has had the no evidence of progression of issues until last night when she had left upper chest pressure into her back with the EKG showing ventricular bigeminy otherwise no evidence of myocardial infarction. Troponin was normal. The patient has had some improvements of symptoms since her hospitalization and has had some more constant pressure today. She has not tried to ambulate at this time but appears not to have an acute myocardial infarction  Past Medical History:  Diagnosis Date  . Asthma 2013  . CHF (congestive heart failure) ([place])   . Diverticulitis 2010  . DVT (deep venous thrombosis) ([place])   . Endometriosis 1990  . Heart disease 2013  . Hx MRSA infection   . Lump or mass in breast   . Restless leg       Surgical History:  Past Surgical History:  Procedure Laterality Date  . ABDOMINAL HYSTERECTOMY     age 51  . CESAREAN SECTION    . CHOLECYSTECTOMY    . COLECTOMY    . NASAL  SINUS SURGERY  2012     Home Meds: Prior to Admission medications   Medication Sig Start Date End Date Taking? Authorizing Provider  albuterol (PROVENTIL HFA;VENTOLIN HFA) 108 (90 Base) MCG/ACT inhaler Inhale 2 puffs into the lungs every 6 (six) hours as needed for wheezing or shortness of breath. 06/06/16  Yes Rudene Re, MD  rOPINIRole (REQUIP) 2 MG tablet Take 1 tablet (2 mg total) by mouth at bedtime. Patient taking differently: Take 4 mg by mouth at bedtime.  05/14/15  Yes Johnn Hai, PA-C    Inpatient Medications:  . enoxaparin (LOVENOX) injection  40 mg Subcutaneous Q24H  . rOPINIRole  4 mg Oral QHS  . sodium chloride flush  3 mL Intravenous Q12H     Allergies:  Allergies  Allergen Reactions  . Contrast Media [Iodinated Diagnostic Agents] Shortness Of Breath  . Latex Hives  . Ondansetron Other (See Comments)    Severe headache  . Penicillins Hives and Other (See Comments)    Has patient had a PCN reaction causing immediate rash, facial/tongue/throat swelling, SOB or lightheadedness with hypotension: Yes Has patient had a PCN reaction causing severe rash involving mucus membranes or skin necrosis: No Has patient had a PCN reaction that required hospitalization No Has patient had a PCN reaction occurring within the last 10 years: No If all of the above answers are "NO", then may proceed with Cephalosporin use.   . Povidone-Iodine Rash    Social History   Social History  . Marital status: Married  Spouse name: N/A  . Number of children: N/A  . Years of education: N/A   Occupational History  . works at [employer].    Social History Main Topics  . Smoking status: Never Smoker  . Smokeless tobacco: Never Used  . Alcohol use No  . Drug use: No  . Sexual activity: Not on file   Other Topics Concern  . Not on file   Social History Narrative  . No narrative on file     Family History  Problem Relation Age of Onset  . Cancer Mother 30    ovarian   . Cancer Father 56    brain  . Cancer Daughter 18    skin  . Cancer Maternal Aunt 34    breast     Review of Systems Positive for Chest pain pressure Negative for: General:  chills, fever, night sweats or weight changes.  Cardiovascular: PND orthopnea syncope dizziness  Dermatological skin lesions rashes Respiratory: Cough congestion Urologic: Frequent urination urination at night and hematuria Abdominal: negative for nausea, vomiting, diarrhea, bright red blood per rectum, melena, or hematemesis Neurologic: negative for visual changes, and/or hearing changes  All other systems reviewed and are otherwise negative except as noted above.  Labs:  Recent Labs  12/08/16 2003 12/08/16 2301 12/09/16 0221  TROPONINI <0.03 <0.03 <0.03   Lab Results  Component Value Date   WBC 8.3 12/08/2016   HGB 12.3 12/08/2016   HCT 36.2 12/08/2016   MCV 80.8 12/08/2016   PLT 269 12/08/2016    Recent Labs Lab 12/08/16 2003  NA 136  K 3.4*  CL 105  CO2 25  BUN 11  CREATININE 0.76  CALCIUM 8.8*  GLUCOSE 141*   Lab Results  Component Value Date   CHOL 153 09/03/2014   HDL 32 (L) 09/03/2014   LDLCALC 74 09/03/2014   TRIG 237 (H) 09/03/2014   No results found for: DDIMER  Radiology/Studies:  Dg Chest 2 View  Result Date: 12/08/2016 CLINICAL DATA:  Frequent cough for 3-4 days EXAM: CHEST  2 VIEW COMPARISON:  08/24/2016 FINDINGS: The lungs are clear wiithout focal pneumonia, edema, pneumothorax or pleural effusion. The cardiopericardial silhouette is within normal limits for size. The visualized bony structures of the thorax are intact. IMPRESSION: No active cardiopulmonary disease. Electronically Signed   By: Misty Stanley M.D.   On: 12/08/2016 20:32    EKG: Normal sinus rhythm with preventricular contractions  Weights: Filed Weights   12/08/16 1959 12/09/16 0211 12/09/16 0520  Weight: 90.7 kg (200 lb) 107.1 kg (236 lb 1.6 oz) 106.8 kg (235 lb 6.4 oz)     Physical  Exam: Blood pressure 118/66, pulse (!) 103, temperature 98.3 F (36.8 C), temperature source Oral, resp. rate 18, height 5\' 5"  (1.651 m), weight 106.8 kg (235 lb 6.4 oz), SpO2 97 %. Body mass index is 39.17 kg/m. General: Well developed, well nourished, in no acute distress. Head eyes ears nose throat: Normocephalic, atraumatic, sclera non-icteric, no xanthomas, nares are without discharge. No apparent thyromegaly and/or mass  Lungs: Normal respiratory effort.  no wheezes, no rales, no rhonchi.  Heart: RRR with normal S1 S2. no murmur gallop, no rub, PMI is normal size and placement, carotid upstroke normal without bruit, jugular venous pressure is normal Abdomen: Soft, non-tender, non-distended with normoactive bowel sounds. No hepatomegaly. No rebound/guarding. No obvious abdominal masses. Abdominal aorta is normal size without bruit Extremities: No edema. no cyanosis, no clubbing, no ulcers  Peripheral : 2+ bilateral upper  extremity pulses, 2+ bilateral femoral pulses, 2+ bilateral dorsal pedal pulse Neuro: Alert and oriented. No facial asymmetry. No focal deficit. Moves all extremities spontaneously. Musculoskeletal: Normal muscle tone without kyphosis Psych:  Responds to questions appropriately with a normal affect.    Assessment: 51 year old female with moderate LV systolic dysfunction and mild elevated BNP with left upper chest discomfort and no evidence of significant EKG changes or cardial infarction  Plan: 1. Further consideration of addition of low-dose beta blocker for LV systolic dysfunction chest pain and no reduction of heart failure 2. No further cardiac workup of chest pain without current evidence of significant heart failure or pulmonary edema and or myocardial infarction X 3. Begin ambulation following for improvements of symptoms and further adjustments of medication management either as an in or outpatient 4. Further treatment options based on ambulation and symptoms  which could include reassessment by stress test if necessary  Signed, Corey Skains M.D. [facility] 12/09/2016, 7:18 AM

## 2016-12-09 NOTE — H&P (Signed)
[facility] [place] NAME: Brittany Nguyen    MR#:  [id number]  DATE OF BIRTH:  [birth year]  DATE OF ADMISSION:  12/08/2016  PRIMARY CARE PHYSICIAN: [name], MD   REQUESTING/REFERRING PHYSICIAN: Owens Shark, MD  CHIEF COMPLAINT:   Chief Complaint  Patient presents with  . Chest Pain    HISTORY OF PRESENT ILLNESS:  Brittany Nguyen  is a 51 y.o. female who presents with Central chest pain, pressure-like in quality, persistent over the last 24 hours, radiating to left shoulder and jaw. Patient has prior known history of catheterization which essentially showed some nonobstructing plaques which are treated with angioplasty. This catheterization was done several years ago. She also had developed some systolic heart failure that time, which has since improved on more recent echocardiograms. Hospitalists were called for admission  PAST MEDICAL HISTORY:   Past Medical History:  Diagnosis Date  . Asthma 2013  . CHF (congestive heart failure) ([place])   . Diverticulitis 2010  . DVT (deep venous thrombosis) ([place])   . Endometriosis 1990  . Heart disease 2013  . Hx MRSA infection   . Lump or mass in breast   . Restless leg     PAST SURGICAL HISTORY:   Past Surgical History:  Procedure Laterality Date  . ABDOMINAL HYSTERECTOMY     age 61  . CESAREAN SECTION    . CHOLECYSTECTOMY    . COLECTOMY    . NASAL SINUS SURGERY  2012    SOCIAL HISTORY:   Social History  Substance Use Topics  . Smoking status: Never Smoker  . Smokeless tobacco: Never Used  . Alcohol use No    FAMILY HISTORY:   Family History  Problem Relation Age of Onset  . Cancer Mother 30    ovarian  . Cancer Father 31    brain  . Cancer Daughter 18    skin  . Cancer Maternal Aunt 34    breast    DRUG ALLERGIES:   Allergies  Allergen Reactions  . Contrast Media [Iodinated Diagnostic Agents] Shortness Of Breath  . Latex Hives  . Ondansetron Other (See  Comments)    Severe headache  . Penicillins Hives  . Povidone-Iodine Rash    MEDICATIONS AT HOME:   Prior to Admission medications   Medication Sig Start Date End Date Taking? Authorizing Provider  albuterol (PROVENTIL HFA;VENTOLIN HFA) 108 (90 Base) MCG/ACT inhaler Inhale 2 puffs into the lungs every 6 (six) hours as needed for wheezing or shortness of breath. 06/06/16   Rudene Re, MD  chlorpheniramine-HYDROcodone [facility] ER) 10-8 MG/5ML SUER Take 5 mLs by mouth 2 (two) times daily. 06/06/16   Rudene Re, MD  chlorpheniramine-HYDROcodone [facility] ER) 10-8 MG/5ML SUER Take 5 mLs by mouth every 12 (twelve) hours as needed for cough. 08/24/16   Gregor Hams, MD  rOPINIRole (REQUIP) 2 MG tablet Take 1 tablet (2 mg total) by mouth at bedtime. Patient taking differently: Take 4 mg by mouth at bedtime.  05/14/15   Johnn Hai, PA-C    REVIEW OF SYSTEMS:  Review of Systems  Constitutional: Negative for chills, fever, malaise/fatigue and weight loss.  HENT: Negative for ear pain, hearing loss and tinnitus.   Eyes: Negative for blurred vision, double vision, pain and redness.  Respiratory: Positive for shortness of breath. Negative for cough and hemoptysis.   Cardiovascular: Positive for chest pain. Negative for palpitations, orthopnea and leg swelling.  Gastrointestinal: Negative for abdominal  pain, constipation, diarrhea, nausea and vomiting.  Genitourinary: Negative for dysuria, frequency and hematuria.  Musculoskeletal: Negative for back pain, joint pain and neck pain.  Skin:       No acne, rash, or lesions  Neurological: Negative for dizziness, tremors, focal weakness and weakness.  Endo/Heme/Allergies: Negative for polydipsia. Does not bruise/bleed easily.  Psychiatric/Behavioral: Negative for depression. The patient is not nervous/anxious and does not have insomnia.      VITAL SIGNS:   Vitals:   12/08/16 2230 12/08/16 2300 12/08/16  2330 12/09/16 0000  BP: 119/78 125/81 114/87 119/75  Pulse: (!) 107 (!) 104 (!) 105 (!) 103  Resp: 15 19 (!) 21 (!) 28  Temp:      TempSrc:      SpO2: 97% 98% 98% 96%  Weight:      Height:       Wt Readings from Last 3 Encounters:  12/08/16 90.7 kg (200 lb)  08/25/16 90.7 kg (200 lb)  08/24/16 90.7 kg (200 lb)    PHYSICAL EXAMINATION:  Physical Exam  Vitals reviewed. Constitutional: She is oriented to person, place, and time. She appears well-developed and well-nourished. No distress.  HENT:  Head: Normocephalic and atraumatic.  Mouth/Throat: Oropharynx is clear and moist.  Eyes: Conjunctivae and EOM are normal. Pupils are equal, round, and reactive to light. No scleral icterus.  Neck: Normal range of motion. Neck supple. No JVD present. No thyromegaly present.  Cardiovascular: Regular rhythm and intact distal pulses.  Exam reveals no gallop and no friction rub.   No murmur heard. Borderline tachycardic  Respiratory: Effort normal and breath sounds normal. No respiratory distress. She has no wheezes. She has no rales.  GI: Soft. Bowel sounds are normal. She exhibits no distension. There is no tenderness.  Musculoskeletal: Normal range of motion. She exhibits no edema.  No arthritis, no gout  Lymphadenopathy:    She has no cervical adenopathy.  Neurological: She is alert and oriented to person, place, and time. No cranial nerve deficit.  No dysarthria, no aphasia  Skin: Skin is warm and dry. No rash noted. No erythema.  Psychiatric: She has a normal mood and affect. Her behavior is normal. Judgment and thought content normal.    LABORATORY PANEL:   CBC  Recent Labs Lab 12/08/16 2003  WBC 8.3  HGB 12.3  HCT 36.2  PLT 269   ------------------------------------------------------------------------------------------------------------------  Chemistries   Recent Labs Lab 12/08/16 2003  NA 136  K 3.4*  CL 105  CO2 25  GLUCOSE 141*  BUN 11  CREATININE 0.76   CALCIUM 8.8*   ------------------------------------------------------------------------------------------------------------------  Cardiac Enzymes  Recent Labs Lab 12/08/16 2301  TROPONINI <0.03   ------------------------------------------------------------------------------------------------------------------  RADIOLOGY:  Dg Chest 2 View  Result Date: 12/08/2016 CLINICAL DATA:  Frequent cough for 3-4 days EXAM: CHEST  2 VIEW COMPARISON:  08/24/2016 FINDINGS: The lungs are clear wiithout focal pneumonia, edema, pneumothorax or pleural effusion. The cardiopericardial silhouette is within normal limits for size. The visualized bony structures of the thorax are intact. IMPRESSION: No active cardiopulmonary disease. Electronically Signed   By: Misty Stanley M.D.   On: 12/08/2016 20:32    EKG:   Orders placed or performed during the hospital encounter of 12/08/16  . EKG 12-Lead  . EKG 12-Lead  . ED EKG within 10 minutes  . ED EKG within 10 minutes    IMPRESSION AND PLAN:  Principal Problem:   Chest pain - 2 sets of troponin is negative in the ED, however  the patient has persistent pain and numerous risk factors. We will admit her for observation, trend her cardiac enzymes, get an echocardiogram and cardiology consult. Active Problems:   Chronic systolic CHF (congestive heart failure) ([place]) - last echocardiogram in our system had recovered EF, her BNP is mildly elevated today at 230. We will repeat echocardiogram and get cardiologist to see her.   GERD - treat when necessary   RLS (restless legs syndrome) - home dose ropinirole  All the records are reviewed and case discussed with ED provider. Management plans discussed with the patient and/or family.  DVT PROPHYLAXIS: SubQ lovenox  GI PROPHYLAXIS: None  ADMISSION STATUS: Observation  CODE STATUS: Full Code Status History    Date Active Date Inactive Code Status Order ID Comments User Context   11/21/2015  6:50 AM 11/22/2015   3:50 PM Full Code [id number]  Saundra Shelling, MD Inpatient   09/19/2015  5:11 AM 09/21/2015  6:29 PM Full Code [id number]  Harrie Foreman, MD Inpatient      TOTAL TIME TAKING CARE OF THIS PATIENT: 40 minutes.    Coty Larsh FIELDING 12/09/2016, 12:36 AM  [facility]  Office  [phone]  CC: Primary care physician; [name], MD

## 2016-12-09 NOTE — ED Notes (Signed)
ED Provider at bedside. 

## 2016-12-09 NOTE — Discharge Instructions (Signed)
Heart healthy diet. Exercise and diet control.

## 2016-12-09 NOTE — Progress Notes (Signed)
Pt has orders to be discharged. Discharge instructions given and pt has no additional questions at this time. Medication regimen reviewed and pt educated. Pt verbalized understanding and has no additional questions. Telemetry box removed. IV removed and site in good condition. Pt stable and waiting for transportation.   Brittany Ferrall RN 

## 2016-12-09 NOTE — Progress Notes (Signed)
*  PRELIMINARY RESULTS* Echocardiogram 2D Echocardiogram has been performed.  Sherrie Sport 12/09/2016, 11:22 AM

## 2016-12-09 NOTE — Discharge Summary (Addendum)
[facility] [place] NAME: Brittany Nguyen    MR#:  [id number]  DATE OF BIRTH:  April 02, 1966  DATE OF ADMISSION:  12/08/2016   ADMITTING PHYSICIAN: Lance Coon, MD  DATE OF DISCHARGE: 12/09/2016 12:07 PM  PRIMARY CARE PHYSICIAN: [name], MD   ADMISSION DIAGNOSIS:  chest pain DISCHARGE DIAGNOSIS:  Principal Problem:   Chest pain Active Problems:   GERD   Chronic systolic CHF (congestive heart failure) ([facility])   RLS (restless legs syndrome)  SECONDARY DIAGNOSIS:   Past Medical History:  Diagnosis Date  . Asthma 2013  . CHF (congestive heart failure) ([place])   . Diverticulitis 2010  . DVT (deep venous thrombosis) ([place])   . Endometriosis 1990  . Heart disease 2013  . Hx MRSA infection   . Lump or mass in breast   . Restless leg    HOSPITAL COURSE:   Chest pain, atypical. normal troponin. Started ASA.  Chronic systolic CHF (congestive heart failure) ([facility]) - last echocardiogram in our system had recovered EF, her BNP is mildly elevated today at 230. Echocardiogram: Left ventricle: The cavity size was moderately dilated. Systolic function was severely reduced. The estimated ejection fraction was in the range of 25% to 30%. Diffuse hypokinesis. Per Dr. Nehemiah Massed,  1. Further consideration of addition of low-dose beta blocker for LV systolic dysfunction chest pain and no reduction of heart failure 2. No further cardiac workup of chest pain without current evidence of significant heart failure or pulmonary edema and or myocardial infarction X 3. Begin ambulation following for improvements of symptoms and further adjustments of medication management either as an in or outpatient 4. Further treatment options based on ambulation and symptoms which could include reassessment by stress test if necessary. I discussed with Dr. Nehemiah Massed. Per Dr. Nehemiah Massed, the patient has had chronic systolic CHF with ejection fraction 25% for about one year. Dr.  Nehemiah Massed will consider giving beta blocker and ACE inhibitor in his office next week.  Morbid obesity.   GERD - treat when necessary   RLS (restless legs syndrome) - home dose ropinirole DISCHARGE CONDITIONS:  Stable, discharged to home today. CONSULTS OBTAINED:  Treatment Team:  Corey Skains, MD DRUG ALLERGIES:   Allergies  Allergen Reactions  . Contrast Media [Iodinated Diagnostic Agents] Shortness Of Breath  . Latex Hives  . Ondansetron Other (See Comments)    Severe headache  . Penicillins Hives and Other (See Comments)    Has patient had a PCN reaction causing immediate rash, facial/tongue/throat swelling, SOB or lightheadedness with hypotension: Yes Has patient had a PCN reaction causing severe rash involving mucus membranes or skin necrosis: No Has patient had a PCN reaction that required hospitalization No Has patient had a PCN reaction occurring within the last 10 years: No If all of the above answers are "NO", then may proceed with Cephalosporin use.   . Povidone-Iodine Rash   DISCHARGE MEDICATIONS:   Allergies as of 12/09/2016      Reactions   Contrast Media [iodinated Diagnostic Agents] Shortness Of Breath   Latex Hives   Ondansetron Other (See Comments)   Severe headache   Penicillins Hives, Other (See Comments)   Has patient had a PCN reaction causing immediate rash, facial/tongue/throat swelling, SOB or lightheadedness with hypotension: Yes Has patient had a PCN reaction causing severe rash involving mucus membranes or skin necrosis: No Has patient had a PCN reaction that required hospitalization No Has patient had a PCN reaction occurring  within the last 10 years: No If all of the above answers are "NO", then may proceed with Cephalosporin use.   Povidone-iodine Rash      Medication List    TAKE these medications   albuterol 108 (90 Base) MCG/ACT inhaler Commonly known as:  PROVENTIL HFA;VENTOLIN HFA Inhale 2 puffs into the lungs every 6 (six) hours  as needed for wheezing or shortness of breath.   aspirin 81 MG chewable tablet Chew 1 tablet (81 mg total) by mouth daily.   rOPINIRole 2 MG tablet Commonly known as:  REQUIP Take 1 tablet (2 mg total) by mouth at bedtime. What changed:  how much to take        DISCHARGE INSTRUCTIONS:  See AVS.   If you experience worsening of your admission symptoms, develop shortness of breath, life threatening emergency, suicidal or homicidal thoughts you must seek medical attention immediately by calling 911 or calling your MD immediately  if symptoms less severe.  You Must read complete instructions/literature along with all the possible adverse reactions/side effects for all the Medicines you take and that have been prescribed to you. Take any new Medicines after you have completely understood and accpet all the possible adverse reactions/side effects.   Please note  You were cared for by a hospitalist during your hospital stay. If you have any questions about your discharge medications or the care you received while you were in the hospital after you are discharged, you can call the unit and asked to speak with the hospitalist on call if the hospitalist that took care of you is not available. Once you are discharged, your primary care physician will handle any further medical issues. Please note that NO REFILLS for any discharge medications will be authorized once you are discharged, as it is imperative that you return to your primary care physician (or establish a relationship with a primary care physician if you do not have one) for your aftercare needs so that they can reassess your need for medications and monitor your lab values.    On the day of Discharge:  VITAL SIGNS:  Blood pressure 108/66, pulse 98, temperature 98.4 F (36.9 C), temperature source Oral, resp. rate 17, height 5\' 5"  (1.651 m), weight 235 lb 6.4 oz (106.8 kg), SpO2 91 %. PHYSICAL EXAMINATION:  GENERAL:  51 y.o.-year-old  patient lying in the bed with no acute distress. Morbidly obese. EYES: Pupils equal, round, reactive to light and accommodation. No scleral icterus. Extraocular muscles intact.  HEENT: Head atraumatic, normocephalic. Oropharynx and nasopharynx clear.  NECK:  Supple, no jugular venous distention. No thyroid enlargement, no tenderness.  LUNGS: Normal breath sounds bilaterally, no wheezing, rales,rhonchi or crepitation. No use of accessory muscles of respiration.  CARDIOVASCULAR: S1, S2 normal. No murmurs, rubs, or gallops.  ABDOMEN: Soft, non-tender, non-distended. Bowel sounds present. No organomegaly or mass.  EXTREMITIES: No pedal edema, cyanosis, or clubbing.  NEUROLOGIC: Cranial nerves II through XII are intact. Muscle strength 5/5 in all extremities. Sensation intact. Gait not checked.  PSYCHIATRIC: The patient is alert and oriented x 3.  SKIN: No obvious rash, lesion, or ulcer.  DATA REVIEW:   CBC  Recent Labs Lab 12/09/16 0813  WBC 7.1  HGB 11.9*  HCT 35.2  PLT 248    Chemistries   Recent Labs Lab 12/09/16 0813  NA 139  K 3.7  CL 106  CO2 27  GLUCOSE 98  BUN 12  CREATININE 0.95  CALCIUM 8.6*     Microbiology  Results  Results for orders placed or performed during the hospital encounter of 12/08/16  MRSA PCR Screening     Status: None   Collection Time: 12/09/16  2:40 AM  Result Value Ref Range Status   MRSA by PCR NEGATIVE NEGATIVE Final    Comment:        The GeneXpert MRSA Assay (FDA approved for NASAL specimens only), is one component of a comprehensive MRSA colonization surveillance program. It is not intended to diagnose MRSA infection nor to guide or monitor treatment for MRSA infections.     RADIOLOGY:  Dg Chest 2 View  Result Date: 12/08/2016 CLINICAL DATA:  Frequent cough for 3-4 days EXAM: CHEST  2 VIEW COMPARISON:  08/24/2016 FINDINGS: The lungs are clear wiithout focal pneumonia, edema, pneumothorax or pleural effusion. The  cardiopericardial silhouette is within normal limits for size. The visualized bony structures of the thorax are intact. IMPRESSION: No active cardiopulmonary disease. Electronically Signed   By: Misty Stanley M.D.   On: 12/08/2016 20:32     Management plans discussed with the patient, Her husband and daugher and they are in agreement.  CODE STATUS: Prior   TOTAL TIME TAKING CARE OF THIS PATIENT: 37 minutes.    [name] M.D on 12/09/2016 at 4:41 PM  Between 7am to 6pm - Pager - [phone]  After 6pm go to [email or web address] - Proofreader  [facility]  Office  [phone]  CC: Primary care physician; [name], MD   Note: This dictation was prepared with Dragon dictation along with smaller phrase technology. Any transcriptional errors that result from this process are unintentional.

## 2017-01-21 ENCOUNTER — Inpatient Hospital Stay
Admission: EM | Admit: 2017-01-21 | Discharge: 2017-01-24 | DRG: 190 | Disposition: A | Discharge status: Home / Self Care | Payer: Medicaid Other | Attending: Specialist | Admitting: Specialist

## 2017-01-21 ENCOUNTER — Emergency Department: Payer: Medicaid Other

## 2017-01-21 ENCOUNTER — Encounter: Payer: Self-pay | Admitting: Medical Oncology

## 2017-01-21 DIAGNOSIS — J441 Chronic obstructive pulmonary disease with (acute) exacerbation: Secondary | ICD-10-CM | POA: Diagnosis not present

## 2017-01-21 DIAGNOSIS — I110 Hypertensive heart disease with heart failure: Secondary | ICD-10-CM | POA: Diagnosis present

## 2017-01-21 DIAGNOSIS — Z91040 Latex allergy status: Secondary | ICD-10-CM

## 2017-01-21 DIAGNOSIS — K219 Gastro-esophageal reflux disease without esophagitis: Secondary | ICD-10-CM | POA: Diagnosis present

## 2017-01-21 DIAGNOSIS — G2581 Restless legs syndrome: Secondary | ICD-10-CM | POA: Diagnosis present

## 2017-01-21 DIAGNOSIS — Z8614 Personal history of Methicillin resistant Staphylococcus aureus infection: Secondary | ICD-10-CM | POA: Diagnosis not present

## 2017-01-21 DIAGNOSIS — I5023 Acute on chronic systolic (congestive) heart failure: Secondary | ICD-10-CM | POA: Diagnosis present

## 2017-01-21 DIAGNOSIS — Z90710 Acquired absence of both cervix and uterus: Secondary | ICD-10-CM | POA: Diagnosis not present

## 2017-01-21 DIAGNOSIS — Z880 Allergy status to penicillin: Secondary | ICD-10-CM | POA: Diagnosis not present

## 2017-01-21 DIAGNOSIS — J9601 Acute respiratory failure with hypoxia: Secondary | ICD-10-CM | POA: Diagnosis present

## 2017-01-21 DIAGNOSIS — R0602 Shortness of breath: Secondary | ICD-10-CM

## 2017-01-21 DIAGNOSIS — Z91041 Radiographic dye allergy status: Secondary | ICD-10-CM

## 2017-01-21 DIAGNOSIS — J45901 Unspecified asthma with (acute) exacerbation: Secondary | ICD-10-CM | POA: Diagnosis present

## 2017-01-21 DIAGNOSIS — I429 Cardiomyopathy, unspecified: Secondary | ICD-10-CM | POA: Diagnosis present

## 2017-01-21 DIAGNOSIS — Z7982 Long term (current) use of aspirin: Secondary | ICD-10-CM | POA: Diagnosis not present

## 2017-01-21 DIAGNOSIS — Z888 Allergy status to other drugs, medicaments and biological substances status: Secondary | ICD-10-CM | POA: Diagnosis not present

## 2017-01-21 DIAGNOSIS — R079 Chest pain, unspecified: Secondary | ICD-10-CM

## 2017-01-21 DIAGNOSIS — R0600 Dyspnea, unspecified: Secondary | ICD-10-CM

## 2017-01-21 DIAGNOSIS — Z79899 Other long term (current) drug therapy: Secondary | ICD-10-CM | POA: Diagnosis not present

## 2017-01-21 LAB — CBC WITH DIFFERENTIAL/PLATELET
BASOS ABS: 0 10*3/uL (ref 0–0.1)
Basophils Relative: 1 %
Eosinophils Absolute: 0.2 10*3/uL (ref 0–0.7)
Eosinophils Relative: 2 %
HEMATOCRIT: 34.1 % — AB (ref 35.0–47.0)
Hemoglobin: 11.5 g/dL — ABNORMAL LOW (ref 12.0–16.0)
LYMPHS PCT: 19 %
Lymphs Abs: 1.9 10*3/uL (ref 1.0–3.6)
MCH: 26.4 pg (ref 26.0–34.0)
MCHC: 33.8 g/dL (ref 32.0–36.0)
MCV: 78.1 fL — AB (ref 80.0–100.0)
MONO ABS: 0.6 10*3/uL (ref 0.2–0.9)
MONOS PCT: 6 %
NEUTROS ABS: 7.2 10*3/uL — AB (ref 1.4–6.5)
Neutrophils Relative %: 72 %
Platelets: 277 10*3/uL (ref 150–440)
RBC: 4.36 MIL/uL (ref 3.80–5.20)
RDW: 15.1 % — AB (ref 11.5–14.5)
WBC: 10 10*3/uL (ref 3.6–11.0)

## 2017-01-21 LAB — BASIC METABOLIC PANEL
ANION GAP: 10 (ref 5–15)
BUN: 13 mg/dL (ref 6–20)
CALCIUM: 8.7 mg/dL — AB (ref 8.9–10.3)
CHLORIDE: 104 mmol/L (ref 101–111)
CO2: 25 mmol/L (ref 22–32)
Creatinine, Ser: 0.87 mg/dL (ref 0.44–1.00)
GFR calc Af Amer: >60 mL/min (ref >60)
GFR calc non Af Amer: >60 mL/min (ref >60)
GLUCOSE: 120 mg/dL — AB (ref 65–99)
Potassium: 3.5 mmol/L (ref 3.5–5.1)
Sodium: 139 mmol/L (ref 135–145)

## 2017-01-21 LAB — TSH: TSH: 1.473 u[IU]/mL (ref 0.350–4.500)

## 2017-01-21 LAB — HEPATIC FUNCTION PANEL
ALBUMIN: 3.7 g/dL (ref 3.5–5.0)
ALK PHOS: 87 U/L (ref 38–126)
ALT: 26 U/L (ref 14–54)
AST: 26 U/L (ref 15–41)
BILIRUBIN TOTAL: 0.4 mg/dL (ref 0.3–1.2)
Bilirubin, Direct: <0.1 mg/dL — ABNORMAL LOW (ref 0.1–0.5)
Total Protein: 7.2 g/dL (ref 6.5–8.1)

## 2017-01-21 LAB — T4, FREE: Free T4: 0.86 ng/dL (ref 0.61–1.12)

## 2017-01-21 LAB — TROPONIN I
Troponin I: <0.03 ng/mL (ref <0.03)
Troponin I: <0.03 ng/mL (ref <0.03)

## 2017-01-21 LAB — LIPASE, BLOOD: LIPASE: 30 U/L (ref 11–51)

## 2017-01-21 LAB — BRAIN NATRIURETIC PEPTIDE: B Natriuretic Peptide: 361 pg/mL — ABNORMAL HIGH (ref 0.0–100.0)

## 2017-01-21 MED ORDER — METOCLOPRAMIDE HCL 5 MG/ML IJ SOLN
10.0000 mg | Freq: Once | INTRAMUSCULAR | Status: AC
  Administered 2017-01-21: 10 mg via INTRAVENOUS
  Filled 2017-01-21: qty 2

## 2017-01-21 MED ORDER — IPRATROPIUM-ALBUTEROL 0.5-2.5 (3) MG/3ML IN SOLN
3.0000 mL | Freq: Once | RESPIRATORY_TRACT | Status: AC
  Administered 2017-01-21: 3 mL via RESPIRATORY_TRACT
  Filled 2017-01-21: qty 3

## 2017-01-21 MED ORDER — ACETAMINOPHEN 500 MG PO TABS
ORAL_TABLET | ORAL | Status: AC
  Administered 2017-01-21: 1000 mg via ORAL
  Filled 2017-01-21: qty 2

## 2017-01-21 MED ORDER — NITROGLYCERIN 0.4 MG SL SUBL
0.4000 mg | SUBLINGUAL_TABLET | Freq: Once | SUBLINGUAL | Status: AC
  Administered 2017-01-21: 0.4 mg via SUBLINGUAL
  Filled 2017-01-21: qty 1

## 2017-01-21 MED ORDER — MORPHINE SULFATE (PF) 4 MG/ML IV SOLN
4.0000 mg | Freq: Once | INTRAVENOUS | Status: AC
  Administered 2017-01-21: 4 mg via INTRAVENOUS

## 2017-01-21 MED ORDER — BENZONATATE 100 MG PO CAPS
ORAL_CAPSULE | ORAL | Status: AC
  Administered 2017-01-21: 200 mg via ORAL
  Filled 2017-01-21: qty 2

## 2017-01-21 MED ORDER — LORAZEPAM 2 MG/ML IJ SOLN
INTRAMUSCULAR | Status: AC
  Administered 2017-01-21: 1 mg via INTRAVENOUS
  Filled 2017-01-21: qty 1

## 2017-01-21 MED ORDER — ASPIRIN 81 MG PO CHEW
324.0000 mg | CHEWABLE_TABLET | Freq: Once | ORAL | Status: AC
  Administered 2017-01-21: 324 mg via ORAL
  Filled 2017-01-21: qty 4

## 2017-01-21 MED ORDER — FUROSEMIDE 10 MG/ML IJ SOLN
40.0000 mg | Freq: Once | INTRAMUSCULAR | Status: AC
  Administered 2017-01-21: 40 mg via INTRAVENOUS
  Filled 2017-01-21: qty 4

## 2017-01-21 MED ORDER — TECHNETIUM TO 99M ALBUMIN AGGREGATED
4.0000 | Freq: Once | INTRAVENOUS | Status: AC | PRN
  Administered 2017-01-21: 3.946 via INTRAVENOUS

## 2017-01-21 MED ORDER — ALBUTEROL SULFATE (2.5 MG/3ML) 0.083% IN NEBU
5.0000 mg | INHALATION_SOLUTION | Freq: Once | RESPIRATORY_TRACT | Status: AC
  Administered 2017-01-21: 5 mg via RESPIRATORY_TRACT
  Filled 2017-01-21: qty 6

## 2017-01-21 MED ORDER — PROMETHAZINE HCL 25 MG/ML IJ SOLN
12.5000 mg | Freq: Once | INTRAMUSCULAR | Status: AC
  Administered 2017-01-21: 12.5 mg via INTRAVENOUS

## 2017-01-21 MED ORDER — KETOROLAC TROMETHAMINE 30 MG/ML IJ SOLN
INTRAMUSCULAR | Status: AC
  Administered 2017-01-21: 15 mg via INTRAVENOUS
  Filled 2017-01-21: qty 1

## 2017-01-21 MED ORDER — MAGNESIUM SULFATE 2 GM/50ML IV SOLN
2.0000 g | Freq: Once | INTRAVENOUS | Status: AC
  Administered 2017-01-22: 2 g via INTRAVENOUS
  Filled 2017-01-21: qty 50

## 2017-01-21 MED ORDER — BENZONATATE 100 MG PO CAPS
200.0000 mg | ORAL_CAPSULE | Freq: Once | ORAL | Status: AC
  Administered 2017-01-21: 200 mg via ORAL

## 2017-01-21 MED ORDER — LORAZEPAM 2 MG/ML IJ SOLN
1.0000 mg | Freq: Once | INTRAMUSCULAR | Status: AC
  Administered 2017-01-21: 1 mg via INTRAVENOUS

## 2017-01-21 MED ORDER — ACETAMINOPHEN 500 MG PO TABS
1000.0000 mg | ORAL_TABLET | Freq: Once | ORAL | Status: AC
  Administered 2017-01-21: 1000 mg via ORAL

## 2017-01-21 MED ORDER — TECHNETIUM TC 99M DIETHYLENETRIAME-PENTAACETIC ACID
30.0000 | Freq: Once | INTRAVENOUS | Status: AC | PRN
  Administered 2017-01-21: 31.86 via INTRAVENOUS

## 2017-01-21 MED ORDER — DIPHENHYDRAMINE HCL 50 MG/ML IJ SOLN
25.0000 mg | Freq: Once | INTRAMUSCULAR | Status: AC
  Administered 2017-01-21: 25 mg via INTRAVENOUS
  Filled 2017-01-21: qty 1

## 2017-01-21 MED ORDER — KETOROLAC TROMETHAMINE 30 MG/ML IJ SOLN
15.0000 mg | INTRAMUSCULAR | Status: AC
  Administered 2017-01-21: 15 mg via INTRAVENOUS

## 2017-01-21 MED ORDER — PROMETHAZINE HCL 25 MG/ML IJ SOLN
INTRAMUSCULAR | Status: AC
  Administered 2017-01-21: 12.5 mg via INTRAVENOUS
  Filled 2017-01-21: qty 1

## 2017-01-21 MED ORDER — METHYLPREDNISOLONE SODIUM SUCC 125 MG IJ SOLR
125.0000 mg | Freq: Once | INTRAMUSCULAR | Status: AC
  Administered 2017-01-21: 125 mg via INTRAVENOUS
  Filled 2017-01-21: qty 2

## 2017-01-21 MED ORDER — MORPHINE SULFATE (PF) 4 MG/ML IV SOLN
INTRAVENOUS | Status: AC
  Administered 2017-01-21: 4 mg via INTRAVENOUS
  Filled 2017-01-21: qty 1

## 2017-01-21 NOTE — ED Notes (Signed)
Pt c/o new onset central chest pain with radiation to shoulder, EKG obtained and given to MD.  Received VORB for 4mg  IV Morphine.

## 2017-01-21 NOTE — ED Notes (Signed)
Patient r/f VQ scan

## 2017-01-21 NOTE — ED Notes (Signed)
ED Provider at bedside. 

## 2017-01-21 NOTE — H&P (Signed)
[facility] NAME: Brittany Nguyen    MR#:  [id number]  DATE OF BIRTH:  1966/01/29  DATE OF ADMISSION:  01/21/2017  PRIMARY CARE PHYSICIAN: [name], MD   REQUESTING/REFERRING PHYSICIAN: Joni Fears, M.D.  CHIEF COMPLAINT:   Chief Complaint  Patient presents with  . Asthma    HISTORY OF PRESENT ILLNESS:  Brittany Nguyen  is a 51 y.o. female who presents with Acute exacerbation of asthma. Patient states that for the past couple weeks she's been having seasonal allergy symptoms. She states that she had been able to control her shortness of breath with her rescue inhaler. However, today her home medications were ineffective in controlling her symptoms. She came to the ED for evaluation and was found to have BNP mildly elevated, but all other workup consistent with asthma rather than CHF exacerbation. Hospitalists were called for admission.  PAST MEDICAL HISTORY:   Past Medical History:  Diagnosis Date  . Asthma 2013  . CHF (congestive heart failure) ([place])   . Diverticulitis 2010  . DVT (deep venous thrombosis) ([place])   . Endometriosis 1990  . Heart disease 2013  . Hx MRSA infection   . Lump or mass in breast   . Restless leg     PAST SURGICAL HISTORY:   Past Surgical History:  Procedure Laterality Date  . ABDOMINAL HYSTERECTOMY     age 75  . CESAREAN SECTION    . CHOLECYSTECTOMY    . COLECTOMY    . NASAL SINUS SURGERY  2012    SOCIAL HISTORY:   Social History  Substance Use Topics  . Smoking status: Never Smoker  . Smokeless tobacco: Never Used  . Alcohol use No    FAMILY HISTORY:   Family History  Problem Relation Age of Onset  . Cancer Mother 30       ovarian  . Cancer Father 61       brain  . Cancer Daughter 18       skin  . Cancer Maternal Aunt 34       breast    DRUG ALLERGIES:   Allergies  Allergen Reactions  . Contrast Media [Iodinated Diagnostic Agents] Shortness Of Breath  . Latex  Hives  . Ondansetron Other (See Comments)    Severe headache  . Penicillins Hives and Other (See Comments)    Has patient had a PCN reaction causing immediate rash, facial/tongue/throat swelling, SOB or lightheadedness with hypotension: Yes Has patient had a PCN reaction causing severe rash involving mucus membranes or skin necrosis: No Has patient had a PCN reaction that required hospitalization No Has patient had a PCN reaction occurring within the last 10 years: No If all of the above answers are "NO", then may proceed with Cephalosporin use.   . Povidone-Iodine Rash    MEDICATIONS AT HOME:   Prior to Admission medications   Medication Sig Start Date End Date Taking? Authorizing Provider  albuterol (PROVENTIL HFA;VENTOLIN HFA) 108 (90 Base) MCG/ACT inhaler Inhale 2 puffs into the lungs every 6 (six) hours as needed for wheezing or shortness of breath. 06/06/16  Yes Alfred Levins, [place], MD  aspirin 81 MG chewable tablet Chew 1 tablet (81 mg total) by mouth daily. 12/09/16  Yes [name], MD  rOPINIRole (REQUIP) 2 MG tablet Take 1 tablet (2 mg total) by mouth at bedtime. Patient taking differently: Take 4 mg by mouth at bedtime.  05/14/15  Yes Johnn Hai, PA-C  REVIEW OF SYSTEMS:  Review of Systems  Constitutional: Negative for chills, fever, malaise/fatigue and weight loss.  HENT: Negative for ear pain, hearing loss and tinnitus.   Eyes: Negative for blurred vision, double vision, pain and redness.  Respiratory: Positive for cough, shortness of breath and wheezing. Negative for hemoptysis.   Cardiovascular: Negative for chest pain, palpitations, orthopnea and leg swelling.  Gastrointestinal: Negative for abdominal pain, constipation, diarrhea, nausea and vomiting.  Genitourinary: Negative for dysuria, frequency and hematuria.  Musculoskeletal: Negative for back pain, joint pain and neck pain.  Skin:       No acne, rash, or lesions  Neurological: Negative for dizziness,  tremors, focal weakness and weakness.  Endo/Heme/Allergies: Negative for polydipsia. Does not bruise/bleed easily.  Psychiatric/Behavioral: Negative for depression. The patient is not nervous/anxious and does not have insomnia.      VITAL SIGNS:   Vitals:   01/21/17 1830 01/21/17 2000 01/21/17 2030 01/21/17 2245  BP: 91/64 (!) 85/68 105/62 (!) 110/54  Pulse: (!) 112 (!) 113 (!) 121 (!) 109  Resp: (!) 23 (!) 27 (!) 25 (!) 30  Temp:      TempSrc:      SpO2: 99% 98% 99% 97%  Weight:       Wt Readings from Last 3 Encounters:  01/21/17 107 kg (236 lb)  12/09/16 106.8 kg (235 lb 6.4 oz)  08/25/16 90.7 kg (200 lb)    PHYSICAL EXAMINATION:  Physical Exam  Vitals reviewed. Constitutional: She is oriented to person, place, and time. She appears well-developed and well-nourished. No distress.  HENT:  Head: Normocephalic and atraumatic.  Mouth/Throat: Oropharynx is clear and moist.  Eyes: Conjunctivae and EOM are normal. Pupils are equal, round, and reactive to light. No scleral icterus.  Neck: Normal range of motion. Neck supple. No JVD present. No thyromegaly present.  Cardiovascular: Normal rate, regular rhythm and intact distal pulses.  Exam reveals no gallop and no friction rub.   No murmur heard. Respiratory: Effort normal. No respiratory distress. She has wheezes. She has no rales.  GI: Soft. Bowel sounds are normal. She exhibits no distension. There is no tenderness.  Musculoskeletal: Normal range of motion. She exhibits no edema.  No arthritis, no gout  Lymphadenopathy:    She has no cervical adenopathy.  Neurological: She is alert and oriented to person, place, and time. No cranial nerve deficit.  No dysarthria, no aphasia  Skin: Skin is warm and dry. No rash noted. No erythema.  Psychiatric: She has a normal mood and affect. Her behavior is normal. Judgment and thought content normal.    LABORATORY PANEL:   CBC  Recent Labs Lab 01/21/17 1600  WBC 10.0  HGB 11.5*   HCT 34.1*  PLT 277   ------------------------------------------------------------------------------------------------------------------  Chemistries   Recent Labs Lab 01/21/17 1600  NA 139  K 3.5  CL 104  CO2 25  GLUCOSE 120*  BUN 13  CREATININE 0.87  CALCIUM 8.7*  AST 26  ALT 26  ALKPHOS 87  BILITOT 0.4   ------------------------------------------------------------------------------------------------------------------  Cardiac Enzymes  Recent Labs Lab 01/21/17 2017  TROPONINI <0.03   ------------------------------------------------------------------------------------------------------------------  RADIOLOGY:  Dg Chest 2 View  Result Date: 01/21/2017 CLINICAL DATA:  Asthma.  Difficulty breathing. EXAM: CHEST  2 VIEW COMPARISON:  December 08, 2016 FINDINGS: The heart size is borderline to mildly enlarged. No pneumothorax. No pulmonary nodules or masses. No focal infiltrates. Haziness of the lung bases is likely due to body habitus. No overt edema. IMPRESSION: No active  cardiopulmonary disease. Electronically Signed   By: Dorise Bullion III M.D   On: 01/21/2017 16:18   Nm Pulmonary Perf And Vent  Result Date: 01/21/2017 CLINICAL DATA:  Dyspnea EXAM: NUCLEAR MEDICINE VENTILATION - PERFUSION LUNG SCAN TECHNIQUE: Ventilation images were obtained in multiple projections using inhaled aerosol Tc-109m DTPA. Perfusion images were obtained in multiple projections after intravenous injection of Tc-79m MAA. RADIOPHARMACEUTICALS:  31.9 mCi Technetium-101m DTPA aerosol inhalation and 3.9 mCi Technetium-47m MAA IV COMPARISON:  Correlation with chest radiographs dated 01/21/2017 FINDINGS: Ventilation: No focal ventilation defect. Perfusion: No wedge shaped peripheral perfusion defects to suggest acute pulmonary embolism. Corresponding chest radiographs are clear. IMPRESSION: Negative for pulmonary embolism. Electronically Signed   By: Julian Hy M.D.   On: 01/21/2017 22:00    EKG:    Orders placed or performed during the hospital encounter of 01/21/17  . ED EKG  . ED EKG  . ED EKG  . ED EKG  . EKG 12-Lead  . EKG 12-Lead  . EKG 12-Lead  . EKG 12-Lead  . ED EKG  . ED EKG    IMPRESSION AND PLAN:  Principal Problem:   Asthma exacerbation - IV steroids, when necessary duo nebs and antitussive, continue other home meds Active Problems:   Acute on chronic systolic CHF (congestive heart failure) (HCC) - mild exacerbation, likely due to stress from her asthma exacerbation. Continue home meds   GERD - not on regular home medication for this, treat when necessary  All the records are reviewed and case discussed with ED provider. Management plans discussed with the patient and/or family.  DVT PROPHYLAXIS: SubQ lovenox  GI PROPHYLAXIS: None  ADMISSION STATUS: Inpatient  CODE STATUS: Full Code Status History    Date Active Date Inactive Code Status Order ID Comments User Context   12/09/2016  2:09 AM 12/09/2016  3:07 PM Full Code [id number]  Lance Coon, MD Inpatient   11/21/2015  6:50 AM 11/22/2015  3:50 PM Full Code [id number]  Saundra Shelling, MD Inpatient   09/19/2015  5:11 AM 09/21/2015  6:29 PM Full Code [id number]  Harrie Foreman, MD Inpatient      TOTAL TIME TAKING CARE OF THIS PATIENT: 45 minutes.   Jannifer Franklin, Carmine Carrozza [place] 01/21/2017, 11:59 PM  [facility]  Office  [phone]  CC: Primary care physician; [name], MD  Note:  This document was prepared using Dragon voice recognition software and may include unintentional dictation errors.

## 2017-01-21 NOTE — ED Notes (Signed)
Patient dry heaving into emesis bag after Morphine administration, she says Zofran gives her a headache, will ask MD for 12.5mg  IV Phenergan order.

## 2017-01-21 NOTE — ED Notes (Signed)
Patient transported to V/Q scan 

## 2017-01-21 NOTE — ED Notes (Signed)
Patient transported to X-ray 

## 2017-01-21 NOTE — ED Provider Notes (Signed)
[facility] Emergency Department Provider Note  ____________________________________________  Time seen: Approximately 4:08 PM  I have reviewed the triage vital signs and the nursing notes.   HISTORY  Chief Complaint Asthma    HPI Brittany Nguyen is a 51 y.o. female who complains of shortness of breath for the past week. Gradual onset, constant, worsening. Associated with nonproductive cough. She tried her inhalers without relief. No aggravating or alleviating factors. No chest pain. Not exertional. No orthopnea. She does report increased bilateral leg swelling. She states this feels similar to a previous CHF exacerbation that she had about 6 months ago.     Past Medical History:  Diagnosis Date  . Asthma 2013  . CHF (congestive heart failure) ([place])   . Diverticulitis 2010  . DVT (deep venous thrombosis) ([place])   . Endometriosis 1990  . Heart disease 2013  . Hx MRSA infection   . Lump or mass in breast   . Restless leg      Patient Active Problem List   Diagnosis Date Noted  . Chest pain 12/09/2016  . Chronic systolic CHF (congestive heart failure) ([place]) 12/09/2016  . RLS (restless legs syndrome) 12/09/2016  . Acute bronchitis 11/22/2015  . Cough, persistent 11/22/2015  . Hypokalemia 11/22/2015  . Leukocytosis 11/22/2015  . Dyspnea 11/21/2015  . Respiratory distress 09/19/2015  . Breast pain 01/11/2013  . Hx MRSA infection   . Lump or mass in breast   . GOITER, MULTINODULAR 01/20/2009  . HYPOGLYCEMIA, UNSPECIFIED 01/20/2009  . GERD 01/20/2009  . DIVERTICULITIS OF COLON 01/20/2009     Past Surgical History:  Procedure Laterality Date  . ABDOMINAL HYSTERECTOMY     age 89  . CESAREAN SECTION    . CHOLECYSTECTOMY    . COLECTOMY    . NASAL SINUS SURGERY  2012     Prior to Admission medications   Medication Sig Start Date End Date Taking? Authorizing Provider  albuterol (PROVENTIL HFA;VENTOLIN HFA) 108 (90 Base) MCG/ACT inhaler  Inhale 2 puffs into the lungs every 6 (six) hours as needed for wheezing or shortness of breath. 06/06/16  Yes Alfred Levins, [place], MD  aspirin 81 MG chewable tablet Chew 1 tablet (81 mg total) by mouth daily. 12/09/16  Yes [name], MD  rOPINIRole (REQUIP) 2 MG tablet Take 1 tablet (2 mg total) by mouth at bedtime. Patient taking differently: Take 4 mg by mouth at bedtime.  05/14/15  Yes Letitia Neri L, PA-C     Allergies Contrast media [iodinated diagnostic agents]; Latex; Ondansetron; Penicillins; and Povidone-iodine   Family History  Problem Relation Age of Onset  . Cancer Mother 30       ovarian  . Cancer Father 40       brain  . Cancer Daughter 18       skin  . Cancer Maternal Aunt 52       breast    Social History Social History  Substance Use Topics  . Smoking status: Never Smoker  . Smokeless tobacco: Never Used  . Alcohol use No    Review of Systems  Constitutional:   No fever or chills.  ENT:   No sore throat. No rhinorrhea. Cardiovascular:   No chest pain or syncope. Respiratory:   Positive shortness of breath and cough as above. Gastrointestinal:   Negative for abdominal pain, vomiting and diarrhea.  Musculoskeletal:   Negative for focal pain, positive bilateral lower extremity swelling. All other systems reviewed and are negative except as documented  above in ROS and HPI.  ____________________________________________   PHYSICAL EXAM:  VITAL SIGNS: ED Triage Vitals [01/21/17 1557]  Enc Vitals Group     BP 116/84     Pulse Rate (!) 113     Resp (!) 27     Temp 98.3 F (36.8 C)     Temp Source Oral     SpO2 97 %     Weight 235 lb (106.6 kg)     Height      Head Circumference      Peak Flow      Pain Score      Pain Loc      Pain Edu?      Excl. in [place]?     Vital signs reviewed, nursing assessments reviewed.   Constitutional:   Alert and oriented. Mild respiratory distress. Eyes:   No scleral icterus. No conjunctival pallor. PERRL. EOMI.   No nystagmus. ENT   Head:   Normocephalic and atraumatic.   Nose:   No congestion/rhinnorhea.    Mouth/Throat:   MMM, no pharyngeal erythema. No peritonsillar mass.    Neck:   No meningismus. Full ROM Hematological/Lymphatic/Immunilogical:   No cervical lymphadenopathy. Cardiovascular:   Tachycardia heart rate 120. Regular rhythm.. Symmetric bilateral radial and DP pulses.  No murmurs.  Respiratory:   Tachypnea and accessory muscle use. Good air entry in all lung fields, no focal crackles or wheezing.. Gastrointestinal:   Soft and nontender. Non distended. There is no CVA tenderness.  No rebound, rigidity, or guarding. Genitourinary:   deferred Musculoskeletal:   Normal range of motion in all extremities. No joint effusions.  No lower extremity tenderness.  Nonpitting edema of bilateral lower extremities.. Neurologic:   Normal speech and language.  CN 2-10 normal. Motor grossly intact. No gross focal neurologic deficits are appreciated.  Skin:    Skin is warm, dry and intact. No rash noted.  No petechiae, purpura, or bullae.  ____________________________________________    LABS (pertinent positives/negatives) (all labs ordered are listed, but only abnormal results are displayed) Labs Reviewed  CBC WITH DIFFERENTIAL/PLATELET - Abnormal; Notable for the following:       Result Value   Hemoglobin 11.5 (*)    HCT 34.1 (*)    MCV 78.1 (*)    RDW 15.1 (*)    Neutro Abs 7.2 (*)    All other components within normal limits  BASIC METABOLIC PANEL - Abnormal; Notable for the following:    Glucose, Bld 120 (*)    Calcium 8.7 (*)    All other components within normal limits  BRAIN NATRIURETIC PEPTIDE - Abnormal; Notable for the following:    B Natriuretic Peptide 361.0 (*)    All other components within normal limits  HEPATIC FUNCTION PANEL - Abnormal; Notable for the following:    Bilirubin, Direct <0.1 (*)    All other components within normal limits  LIPASE, BLOOD   TROPONIN I  TSH  T4, FREE  TROPONIN I   ____________________________________________   EKG  Interpreted by me Sinus tachycardia rate 115, left axis, normal intervals. Normal QRS ST segments and T waves  ____________________________________________    RADIOLOGY  Dg Chest 2 View  Result Date: 01/21/2017 CLINICAL DATA:  Asthma.  Difficulty breathing. EXAM: CHEST  2 VIEW COMPARISON:  December 08, 2016 FINDINGS: The heart size is borderline to mildly enlarged. No pneumothorax. No pulmonary nodules or masses. No focal infiltrates. Haziness of the lung bases is likely due to body habitus. No overt  edema. IMPRESSION: No active cardiopulmonary disease. Electronically Signed   By: Dorise Bullion III M.D   On: 01/21/2017 16:18   Nm Pulmonary Perf And Vent  Result Date: 01/21/2017 CLINICAL DATA:  Dyspnea EXAM: NUCLEAR MEDICINE VENTILATION - PERFUSION LUNG SCAN TECHNIQUE: Ventilation images were obtained in multiple projections using inhaled aerosol Tc-77m DTPA. Perfusion images were obtained in multiple projections after intravenous injection of Tc-25m MAA. RADIOPHARMACEUTICALS:  31.9 mCi Technetium-42m DTPA aerosol inhalation and 3.9 mCi Technetium-4m MAA IV COMPARISON:  Correlation with chest radiographs dated 01/21/2017 FINDINGS: Ventilation: No focal ventilation defect. Perfusion: No wedge shaped peripheral perfusion defects to suggest acute pulmonary embolism. Corresponding chest radiographs are clear. IMPRESSION: Negative for pulmonary embolism. Electronically Signed   By: Julian Hy M.D.   On: 01/21/2017 22:00    ____________________________________________   PROCEDURES Procedures  ____________________________________________   INITIAL IMPRESSION / ASSESSMENT AND PLAN / ED COURSE  Pertinent labs & imaging results that were available during my care of the patient were reviewed by me and considered in my medical decision making (see chart for details).  Patient presents with  shortness of breath. Denies chest pain. Has complicated medical history including CHF, COPD, and DVT. This presents a range of possibilities for her presentation with clear lungs on exam and tachycardia including pulmonary edema, COPD exacerbation, pulmonary embolism. Low suspicion of ACS. Will check labs and chest x-ray, give bronchodilators and steroids, and if workup is inconclusive, patient may need a CT angiogram of the chest. Weight does not seem to be markedly increased from last weight of record, although it is 14 pounds higher from patient stated last weight of 222 at home.. Clinical Course as of Jan 22 2316  Sat Jan 21, 2017  1618 CXR reviewed by me, appears unremarkable. No edema or consolidation, nl mediastinum  [PS]  1747 Workup negative. Dyspnea somewhat improved after BDs, but still with tachypnea and acc. Muscle use. Will proceed with v/q scan due to prior CT contrast allergy.   [PS]  2036 Pt anxious when lying flat, which is necessary for v/q scan. Will give ativan for anxiety. Pt already rec'd morphine for chest pain and phenergan for related nausea after morphine.   [PS]  2233 VQ scan negative. Patient still tachycardic to 120, still wheezing and coughing and short of breath. Currently symptoms appear to be COPD exacerbation. Continue bronchodilators, plan to hospitalize for continued management. We'll give ceftriaxone and azithromycin. NM Pulmonary Perf and Vent [PS]    Clinical Course User Index [PS] Carrie Mew, MD     ____________________________________________   FINAL CLINICAL IMPRESSION(S) / ED DIAGNOSES  Final diagnoses:  Dyspnea  Shortness of breath  COPD exacerbation ([place])  Nonspecific chest pain      New Prescriptions   No medications on file     Portions of this note were generated with dragon dictation software. Dictation errors may occur despite best attempts at proofreading.    Carrie Mew, MD 01/21/17 [phone]

## 2017-01-21 NOTE — ED Triage Notes (Signed)
Pt reports worsening sob over past week, pt reports hx of asthma and CHF. Pt reports inhaler has not helped. Pt has obvious labored respirations with wheezing and cough.

## 2017-01-22 LAB — BASIC METABOLIC PANEL
ANION GAP: 8 (ref 5–15)
BUN: 14 mg/dL (ref 6–20)
CO2: 25 mmol/L (ref 22–32)
Calcium: 9.2 mg/dL (ref 8.9–10.3)
Chloride: 105 mmol/L (ref 101–111)
Creatinine, Ser: 0.97 mg/dL (ref 0.44–1.00)
GFR calc non Af Amer: >60 mL/min (ref >60)
GLUCOSE: 224 mg/dL — AB (ref 65–99)
POTASSIUM: 3.6 mmol/L (ref 3.5–5.1)
Sodium: 138 mmol/L (ref 135–145)

## 2017-01-22 LAB — CBC
HEMATOCRIT: 34.7 % — AB (ref 35.0–47.0)
Hemoglobin: 11.4 g/dL — ABNORMAL LOW (ref 12.0–16.0)
MCH: 26.4 pg (ref 26.0–34.0)
MCHC: 33 g/dL (ref 32.0–36.0)
MCV: 80 fL (ref 80.0–100.0)
PLATELETS: 264 10*3/uL (ref 150–440)
RBC: 4.33 MIL/uL (ref 3.80–5.20)
RDW: 15.1 % — AB (ref 11.5–14.5)
WBC: 11.9 10*3/uL — ABNORMAL HIGH (ref 3.6–11.0)

## 2017-01-22 MED ORDER — BENZONATATE 100 MG PO CAPS
200.0000 mg | ORAL_CAPSULE | Freq: Two times a day (BID) | ORAL | Status: DC | PRN
  Administered 2017-01-22 – 2017-01-24 (×3): 200 mg via ORAL
  Filled 2017-01-22 (×3): qty 2

## 2017-01-22 MED ORDER — KETOROLAC TROMETHAMINE 10 MG PO TABS
10.0000 mg | ORAL_TABLET | Freq: Once | ORAL | Status: AC
  Administered 2017-01-22: 10 mg via ORAL
  Filled 2017-01-22: qty 1

## 2017-01-22 MED ORDER — ACETAMINOPHEN 650 MG RE SUPP: 650.0000 mg | Freq: Four times a day (QID) | RECTAL | Status: DC | PRN

## 2017-01-22 MED ORDER — OXYCODONE-ACETAMINOPHEN 5-325 MG PO TABS
1.0000 | ORAL_TABLET | Freq: Four times a day (QID) | ORAL | Status: DC | PRN
  Administered 2017-01-22 – 2017-01-24 (×6): 1 via ORAL
  Filled 2017-01-22 (×6): qty 1

## 2017-01-22 MED ORDER — IPRATROPIUM-ALBUTEROL 0.5-2.5 (3) MG/3ML IN SOLN
3.0000 mL | RESPIRATORY_TRACT | Status: DC | PRN
  Administered 2017-01-22: 3 mL via RESPIRATORY_TRACT
  Filled 2017-01-22: qty 3

## 2017-01-22 MED ORDER — ASPIRIN 81 MG PO CHEW
81.0000 mg | CHEWABLE_TABLET | Freq: Every day | ORAL | Status: DC
  Administered 2017-01-22 – 2017-01-24 (×3): 81 mg via ORAL
  Filled 2017-01-22 (×3): qty 1

## 2017-01-22 MED ORDER — ENOXAPARIN SODIUM 40 MG/0.4ML ~~LOC~~ SOLN
40.0000 mg | SUBCUTANEOUS | Status: DC
  Administered 2017-01-22 – 2017-01-23 (×2): 40 mg via SUBCUTANEOUS
  Filled 2017-01-22 (×2): qty 0.4

## 2017-01-22 MED ORDER — ACETAMINOPHEN 325 MG PO TABS
650.0000 mg | ORAL_TABLET | Freq: Four times a day (QID) | ORAL | Status: DC | PRN
  Administered 2017-01-22 (×2): 650 mg via ORAL
  Filled 2017-01-22 (×2): qty 2

## 2017-01-22 MED ORDER — METHYLPREDNISOLONE SODIUM SUCC 125 MG IJ SOLR
60.0000 mg | Freq: Four times a day (QID) | INTRAMUSCULAR | Status: DC
  Administered 2017-01-22 – 2017-01-23 (×6): 60 mg via INTRAVENOUS
  Filled 2017-01-22 (×6): qty 2

## 2017-01-22 MED ORDER — GUAIFENESIN-DM 100-10 MG/5ML PO SYRP
5.0000 mL | ORAL_SOLUTION | ORAL | Status: DC | PRN
  Administered 2017-01-22 – 2017-01-24 (×5): 5 mL via ORAL
  Filled 2017-01-22 (×5): qty 5

## 2017-01-22 MED ORDER — ROPINIROLE HCL 1 MG PO TABS
4.0000 mg | ORAL_TABLET | Freq: Every day | ORAL | Status: DC
  Administered 2017-01-22 – 2017-01-23 (×3): 4 mg via ORAL
  Filled 2017-01-22 (×3): qty 4

## 2017-01-22 MED ORDER — PROCHLORPERAZINE EDISYLATE 5 MG/ML IJ SOLN
5.0000 mg | INTRAMUSCULAR | Status: DC | PRN
  Administered 2017-01-24: 5 mg via INTRAVENOUS
  Filled 2017-01-22: qty 2
  Filled 2017-01-22 (×2): qty 1

## 2017-01-22 MED ORDER — IPRATROPIUM-ALBUTEROL 0.5-2.5 (3) MG/3ML IN SOLN
3.0000 mL | RESPIRATORY_TRACT | Status: DC
  Administered 2017-01-22 – 2017-01-23 (×6): 3 mL via RESPIRATORY_TRACT
  Filled 2017-01-22 (×5): qty 3

## 2017-01-22 NOTE — Progress Notes (Signed)
Pt arrived to the floor via stretcher from ED. Pt A&O but drowsy. No complaints of pain at this time. Telemetry monitor applied and called to CCMD. Oriented to room, and call system. Skin intact

## 2017-01-22 NOTE — Consult Note (Signed)
[facility] Consultation Note  Patient ID: Brittany Nguyen, MRN: [id number], DOB/AGE: 1965/11/10 51 y.o. Admit date: 01/21/2017   Date of Consult: 01/22/2017 Primary Physician: [name], MD Primary Cardiologist: Nehemiah Massed  Chief Complaint:  Chief Complaint  Patient presents with  . Asthma   Reason for Consult: acute on chronic systolic dysfunction heart failure with asthma  HPI: 51 y.o. female with known LV systolic dysfunction with ejection fraction of 25-30% as well as history of essential hypertension and asthma. The patient has had recent exacerbations of asthma as well as heart failure with severe shortness of breath lower extremity edema PND and orthopnea. This progressed to the point where she had to come to the hospital. At this time she did have improvements with oxygenation and inhalers and Lasix. Currently she is hemodynamically stable without evidence of myocardial infarction with a normal troponin.  Past Medical History:  Diagnosis Date  . Asthma 2013  . CHF (congestive heart failure) ([place])   . Diverticulitis 2010  . DVT (deep venous thrombosis) ([place])   . Endometriosis 1990  . Heart disease 2013  . Hx MRSA infection   . Lump or mass in breast   . Restless leg       Surgical History:  Past Surgical History:  Procedure Laterality Date  . ABDOMINAL HYSTERECTOMY     age 33  . CESAREAN SECTION    . CHOLECYSTECTOMY    . COLECTOMY    . NASAL SINUS SURGERY  2012     Home Meds: Prior to Admission medications   Medication Sig Start Date End Date Taking? Authorizing Provider  albuterol (PROVENTIL HFA;VENTOLIN HFA) 108 (90 Base) MCG/ACT inhaler Inhale 2 puffs into the lungs every 6 (six) hours as needed for wheezing or shortness of breath. 06/06/16  Yes Alfred Levins, [place], MD  aspirin 81 MG chewable tablet Chew 1 tablet (81 mg total) by mouth daily. 12/09/16  Yes [name], MD  rOPINIRole (REQUIP) 2 MG tablet Take 1 tablet (2 mg total) by mouth at  bedtime. Patient taking differently: Take 4 mg by mouth at bedtime.  05/14/15  Yes Johnn Hai, PA-C    Inpatient Medications:  . aspirin  81 mg Oral Daily  . enoxaparin (LOVENOX) injection  40 mg Subcutaneous Q24H  . methylPREDNISolone (SOLU-MEDROL) injection  60 mg Intravenous Q6H  . rOPINIRole  4 mg Oral QHS     Allergies:  Allergies  Allergen Reactions  . Contrast Media [Iodinated Diagnostic Agents] Shortness Of Breath  . Latex Hives  . Ondansetron Other (See Comments)    Severe headache  . Penicillins Hives and Other (See Comments)    Has patient had a PCN reaction causing immediate rash, facial/tongue/throat swelling, SOB or lightheadedness with hypotension: Yes Has patient had a PCN reaction causing severe rash involving mucus membranes or skin necrosis: No Has patient had a PCN reaction that required hospitalization No Has patient had a PCN reaction occurring within the last 10 years: No If all of the above answers are "NO", then may proceed with Cephalosporin use.   . Povidone-Iodine Rash    Social History   Social History  . Marital status: Married    Spouse name: N/A  . Number of children: N/A  . Years of education: N/A   Occupational History  . works at [employer].    Social History Main Topics  . Smoking status: Never Smoker  . Smokeless tobacco: Never Used  . Alcohol use No  . Drug use:  No  . Sexual activity: Not on file   Other Topics Concern  . Not on file   Social History Narrative  . No narrative on file     Family History  Problem Relation Age of Onset  . Cancer Mother 30       ovarian  . Cancer Father 68       brain  . Cancer Daughter 18       skin  . Cancer Maternal Aunt 34       breast     Review of Systems Positive for Shortness of breath PND orthopnea Negative for: General:  chills, fever, night sweats or weight changes.  Cardiovascular: Positive for PND orthopnea negative for syncope dizziness  Dermatological skin  lesions rashes Respiratory: Cough congestion Urologic: Frequent urination urination at night and hematuria Abdominal: negative for nausea, vomiting, diarrhea, bright red blood per rectum, melena, or hematemesis Neurologic: negative for visual changes, and/or hearing changes  All other systems reviewed and are otherwise negative except as noted above.  Labs:  Recent Labs  01/21/17 1600 01/21/17 2017  TROPONINI <0.03 <0.03   Lab Results  Component Value Date   WBC 11.9 (H) 01/22/2017   HGB 11.4 (L) 01/22/2017   HCT 34.7 (L) 01/22/2017   MCV 80.0 01/22/2017   PLT 264 01/22/2017    Recent Labs Lab 01/21/17 1600 01/22/17 0419  NA 139 138  K 3.5 3.6  CL 104 105  CO2 25 25  BUN 13 14  CREATININE 0.87 0.97  CALCIUM 8.7* 9.2  PROT 7.2  --   BILITOT 0.4  --   ALKPHOS 87  --   ALT 26  --   AST 26  --   GLUCOSE 120* 224*   Lab Results  Component Value Date   CHOL 153 09/03/2014   HDL 32 (L) 09/03/2014   LDLCALC 74 09/03/2014   TRIG 237 (H) 09/03/2014   No results found for: DDIMER  Radiology/Studies:  Dg Chest 2 View  Result Date: 01/21/2017 CLINICAL DATA:  Asthma.  Difficulty breathing. EXAM: CHEST  2 VIEW COMPARISON:  December 08, 2016 FINDINGS: The heart size is borderline to mildly enlarged. No pneumothorax. No pulmonary nodules or masses. No focal infiltrates. Haziness of the lung bases is likely due to body habitus. No overt edema. IMPRESSION: No active cardiopulmonary disease. Electronically Signed   By: Dorise Bullion III M.D   On: 01/21/2017 16:18   Nm Pulmonary Perf And Vent  Result Date: 01/21/2017 CLINICAL DATA:  Dyspnea EXAM: NUCLEAR MEDICINE VENTILATION - PERFUSION LUNG SCAN TECHNIQUE: Ventilation images were obtained in multiple projections using inhaled aerosol Tc-71m DTPA. Perfusion images were obtained in multiple projections after intravenous injection of Tc-46m MAA. RADIOPHARMACEUTICALS:  31.9 mCi Technetium-60m DTPA aerosol inhalation and 3.9 mCi  Technetium-65m MAA IV COMPARISON:  Correlation with chest radiographs dated 01/21/2017 FINDINGS: Ventilation: No focal ventilation defect. Perfusion: No wedge shaped peripheral perfusion defects to suggest acute pulmonary embolism. Corresponding chest radiographs are clear. IMPRESSION: Negative for pulmonary embolism. Electronically Signed   By: Julian Hy M.D.   On: 01/21/2017 22:00    EKG: Normal sinus rhythm  Weights: Filed Weights   01/21/17 1606 01/22/17 0116 01/22/17 0416  Weight: 107 kg (236 lb) 105.7 kg (233 lb 1.6 oz) 105.7 kg (233 lb 1.6 oz)     Physical Exam: Blood pressure 109/64, pulse (!) 112, temperature 98.3 F (36.8 C), temperature source Oral, resp. rate 16, height 5\' 5"  (1.651 m), weight 105.7 kg (233  lb 1.6 oz), last menstrual period 01/22/2012, SpO2 96 %. Body mass index is 38.79 kg/m. General: Well developed, well nourished, in no acute distress. Head eyes ears nose throat: Normocephalic, atraumatic, sclera non-icteric, no xanthomas, nares are without discharge. No apparent thyromegaly and/or mass  Lungs: Normal respiratory effort.  no wheezes, no rales, no rhonchi.  Heart: RRR with normal S1 S2. no murmur gallop, no rub, PMI is normal size and placement, carotid upstroke normal without bruit, jugular venous pressure is normal Abdomen: Soft, non-tender, non-distended with normoactive bowel sounds. No hepatomegaly. No rebound/guarding. No obvious abdominal masses. Abdominal aorta is normal size without bruit Extremities: No edema. no cyanosis, no clubbing, no ulcers  Peripheral : 2+ bilateral upper extremity pulses, 2+ bilateral femoral pulses, 2+ bilateral dorsal pedal pulse Neuro: Alert and oriented. No facial asymmetry. No focal deficit. Moves all extremities spontaneously. Musculoskeletal: Normal muscle tone without kyphosis Psych:  Responds to questions appropriately with a normal affect.    Assessment: 51 year old female with acute on chronic systolic  dysfunction heart failure and asthma exacerbation with lower extremity edema slightly improved at this time without evidence of myocardial infarction  Plan: 1. Continue treatment of asthma with inhalers as needed 2. No further cardiac diagnostics necessary at this time 3. Continue Lasix and changed to oral Lasix at this time for extremity edema and pulmonary edema with LV systolic dysfunction 9 4. Consideration of beta blocker if able although patient has had history of bradycardia when using beta blocker and may exacerbate asthma 5. Begin ambulation and follow for improvements of symptoms and possible discharged home with follow-up in next 2 weeks Signed, Corey Skains M.D. [facility] 01/22/2017, 7:54 AM

## 2017-01-22 NOTE — Progress Notes (Signed)
[facility] [place] NAME: Brittany Nguyen    MR#:  [id number]  DATE OF BIRTH:  1965/09/28  SUBJECTIVE:  CHIEF COMPLAINT:   Chief Complaint  Patient presents with  . Asthma   Came with cough and SOB- have asthma exacrbation, still excessive coughing when I saw in room.  REVIEW OF SYSTEMS:  CONSTITUTIONAL: No fever, fatigue or weakness.  EYES: No blurred or double vision.  EARS, NOSE, AND THROAT: No tinnitus or ear pain.  RESPIRATORY: positive for cough, shortness of breath, wheezing , no hemoptysis.  CARDIOVASCULAR: No chest pain, orthopnea, edema.  GASTROINTESTINAL: No nausea, vomiting, diarrhea or abdominal pain.  GENITOURINARY: No dysuria, hematuria.  ENDOCRINE: No polyuria, nocturia,  HEMATOLOGY: No anemia, easy bruising or bleeding SKIN: No rash or lesion. MUSCULOSKELETAL: No joint pain or arthritis.   NEUROLOGIC: No tingling, numbness, weakness.  PSYCHIATRY: No anxiety or depression.   ROS  DRUG ALLERGIES:   Allergies  Allergen Reactions  . Contrast Media [Iodinated Diagnostic Agents] Shortness Of Breath  . Latex Hives  . Ondansetron Other (See Comments)    Severe headache  . Penicillins Hives and Other (See Comments)    Has patient had a PCN reaction causing immediate rash, facial/tongue/throat swelling, SOB or lightheadedness with hypotension: Yes Has patient had a PCN reaction causing severe rash involving mucus membranes or skin necrosis: No Has patient had a PCN reaction that required hospitalization No Has patient had a PCN reaction occurring within the last 10 years: No If all of the above answers are "NO", then may proceed with Cephalosporin use.   . Povidone-Iodine Rash    VITALS:  Blood pressure (!) 130/91, pulse (!) 122, temperature 98 F (36.7 C), temperature source Oral, resp. rate (!) 22, height 5\' 5"  (1.651 m), weight 105.7 kg (233 lb 1.6 oz), last menstrual period 01/22/2012, SpO2 100 %.  PHYSICAL  EXAMINATION:  GENERAL:  51 y.o.-year-old patient lying in the bed with no acute distress. Repeated coughing during my visit. EYES: Pupils equal, round, reactive to light and accommodation. No scleral icterus. Extraocular muscles intact.  HEENT: Head atraumatic, normocephalic. Oropharynx and nasopharynx clear.  NECK:  Supple, no jugular venous distention. No thyroid enlargement, no tenderness.  LUNGS: Normal breath sounds bilaterally, b/l wheezing, no crepitation. No use of accessory muscles of respiration.  CARDIOVASCULAR: S1, S2 normal. No murmurs, rubs, or gallops.  ABDOMEN: Soft, nontender, nondistended. Bowel sounds present. No organomegaly or mass.  EXTREMITIES: No pedal edema, cyanosis, or clubbing.  NEUROLOGIC: Cranial nerves II through XII are intact. Muscle strength 5/5 in all extremities. Sensation intact. Gait not checked.  PSYCHIATRIC: The patient is alert and oriented x 3.  SKIN: No obvious rash, lesion, or ulcer.   Physical Exam LABORATORY PANEL:   CBC  Recent Labs Lab 01/22/17 0419  WBC 11.9*  HGB 11.4*  HCT 34.7*  PLT 264   ------------------------------------------------------------------------------------------------------------------  Chemistries   Recent Labs Lab 01/21/17 1600 01/22/17 0419  NA 139 138  K 3.5 3.6  CL 104 105  CO2 25 25  GLUCOSE 120* 224*  BUN 13 14  CREATININE 0.87 0.97  CALCIUM 8.7* 9.2  AST 26  --   ALT 26  --   ALKPHOS 87  --   BILITOT 0.4  --    ------------------------------------------------------------------------------------------------------------------  Cardiac Enzymes  Recent Labs Lab 01/21/17 1600 01/21/17 2017  TROPONINI <0.03 <0.03   ------------------------------------------------------------------------------------------------------------------  RADIOLOGY:  Dg Chest 2 View  Result Date: 01/21/2017 CLINICAL DATA:  Asthma.  Difficulty breathing. EXAM: CHEST  2 VIEW COMPARISON:  December 08, 2016 FINDINGS: The  heart size is borderline to mildly enlarged. No pneumothorax. No pulmonary nodules or masses. No focal infiltrates. Haziness of the lung bases is likely due to body habitus. No overt edema. IMPRESSION: No active cardiopulmonary disease. Electronically Signed   By: Dorise Bullion III M.D   On: 01/21/2017 16:18   Nm Pulmonary Perf And Vent  Result Date: 01/21/2017 CLINICAL DATA:  Dyspnea EXAM: NUCLEAR MEDICINE VENTILATION - PERFUSION LUNG SCAN TECHNIQUE: Ventilation images were obtained in multiple projections using inhaled aerosol Tc-60m DTPA. Perfusion images were obtained in multiple projections after intravenous injection of Tc-52m MAA. RADIOPHARMACEUTICALS:  31.9 mCi Technetium-31m DTPA aerosol inhalation and 3.9 mCi Technetium-23m MAA IV COMPARISON:  Correlation with chest radiographs dated 01/21/2017 FINDINGS: Ventilation: No focal ventilation defect. Perfusion: No wedge shaped peripheral perfusion defects to suggest acute pulmonary embolism. Corresponding chest radiographs are clear. IMPRESSION: Negative for pulmonary embolism. Electronically Signed   By: Julian Hy M.D.   On: 01/21/2017 22:00    ASSESSMENT AND PLAN:   Principal Problem:   Asthma exacerbation Active Problems:   GERD   Acute on chronic systolic CHF (congestive heart failure) (H[facility])  * Asthma exacerbation - IV steroids, duo nebs and antitussive, continue other home meds    Currently does not seem to be needing antibiotics.  * Acute on chronic systolic CHF (congestive heart failure) ([facility]) - mild exacerbation, likely due to stress from her asthma exacerbation.    Appreciated cardiology consult, oral lasix. *  GERD - not on regular home medication for this, treat when necessary   All the records are reviewed and case discussed with Care Management/Social Workerr. Management plans discussed with the patient, family and they are in agreement.  CODE STATUS: full.  TOTAL TIME TAKING CARE OF THIS PATIENT: 35 minutes.      POSSIBLE D/C IN 1-2 DAYS, DEPENDING ON CLINICAL CONDITION.   Vaughan Basta M.D on 01/22/2017   Between 7am to 6pm - Pager - [phone]  After 6pm go to [email or web address] - password [email or web address] [facility]  Office  [phone]  CC: Primary care physician; [name], MD  Note: This dictation was prepared with Dragon dictation along with smaller phrase technology. Any transcriptional errors that result from this process are unintentional.

## 2017-01-23 LAB — BASIC METABOLIC PANEL
Anion gap: 6 (ref 5–15)
BUN: 25 mg/dL — ABNORMAL HIGH (ref 6–20)
CO2: 28 mmol/L (ref 22–32)
Calcium: 9 mg/dL (ref 8.9–10.3)
Chloride: 105 mmol/L (ref 101–111)
Creatinine, Ser: 0.9 mg/dL (ref 0.44–1.00)
GFR calc Af Amer: >60 mL/min (ref >60)
GFR calc non Af Amer: >60 mL/min (ref >60)
Glucose, Bld: 166 mg/dL — ABNORMAL HIGH (ref 65–99)
Potassium: 4.6 mmol/L (ref 3.5–5.1)
Sodium: 139 mmol/L (ref 135–145)

## 2017-01-23 MED ORDER — CARVEDILOL 6.25 MG PO TABS
3.1250 mg | ORAL_TABLET | Freq: Two times a day (BID) | ORAL | Status: DC
  Administered 2017-01-23 – 2017-01-24 (×2): 3.125 mg via ORAL
  Filled 2017-01-23 (×2): qty 1

## 2017-01-23 MED ORDER — FUROSEMIDE 40 MG PO TABS
40.0000 mg | ORAL_TABLET | Freq: Every day | ORAL | Status: DC
  Administered 2017-01-23 – 2017-01-24 (×2): 40 mg via ORAL
  Filled 2017-01-23 (×2): qty 1

## 2017-01-23 MED ORDER — DIPHENHYDRAMINE HCL 25 MG PO CAPS
25.0000 mg | ORAL_CAPSULE | Freq: Four times a day (QID) | ORAL | Status: DC | PRN
  Administered 2017-01-23: 25 mg via ORAL
  Filled 2017-01-23: qty 1

## 2017-01-23 MED ORDER — LISINOPRIL 5 MG PO TABS
5.0000 mg | ORAL_TABLET | Freq: Every day | ORAL | Status: DC
  Administered 2017-01-23 – 2017-01-24 (×2): 5 mg via ORAL
  Filled 2017-01-23: qty 1

## 2017-01-23 MED ORDER — MOMETASONE FURO-FORMOTEROL FUM 100-5 MCG/ACT IN AERO
2.0000 | INHALATION_SPRAY | Freq: Two times a day (BID) | RESPIRATORY_TRACT | Status: DC
  Administered 2017-01-24: 2 via RESPIRATORY_TRACT
  Filled 2017-01-23: qty 8.8

## 2017-01-23 MED ORDER — METHYLPREDNISOLONE SODIUM SUCC 125 MG IJ SOLR
60.0000 mg | Freq: Two times a day (BID) | INTRAMUSCULAR | Status: DC
  Administered 2017-01-23 – 2017-01-24 (×2): 60 mg via INTRAVENOUS
  Filled 2017-01-23 (×2): qty 2

## 2017-01-23 MED ORDER — IPRATROPIUM-ALBUTEROL 0.5-2.5 (3) MG/3ML IN SOLN
3.0000 mL | Freq: Four times a day (QID) | RESPIRATORY_TRACT | Status: DC
  Administered 2017-01-23 – 2017-01-24 (×3): 3 mL via RESPIRATORY_TRACT
  Filled 2017-01-23 (×3): qty 3

## 2017-01-23 MED ORDER — BUDESONIDE 0.5 MG/2ML IN SUSP
0.5000 mg | Freq: Two times a day (BID) | RESPIRATORY_TRACT | Status: DC
  Administered 2017-01-23: 0.5 mg via RESPIRATORY_TRACT
  Filled 2017-01-23: qty 2

## 2017-01-23 NOTE — Progress Notes (Signed)
Notified MD of pt having several pvc's. Orders placed. Will continue to monitor.

## 2017-01-23 NOTE — Discharge Instructions (Signed)
[facility] Failure Clinic appointment on Feb 01, 2017 at 9:00am with Darylene Price, [place]. Please call [phone] to reschedule.

## 2017-01-23 NOTE — Progress Notes (Signed)
[facility] Encounter Note  Patient: Brittany Nguyen / Admit Date: 01/21/2017 / Date of Encounter: 01/23/2017, 8:57 AM   Subjective: Patient is still mildly short of breath weak and fatigued. Some continued lower extremity edema and hypoxia requiring nasal cannula. Echocardiogram showing moderate LV systolic dysfunction with minor valvular heart disease and ejection fraction of 30% unchanged from before  Review of Systems: Positive for: Shortness of breath weakness and fatigue Negative for: Vision change, hearing change, syncope, dizziness, nausea, vomiting,diarrhea, bloody stool, stomach pain, cough, congestion, diaphoresis, urinary frequency, urinary pain,skin lesions, skin rashes Others previously listed  Objective: Telemetry: Normal sinus rhythm Physical Exam: Blood pressure (!) 119/58, pulse (!) 103, temperature 98.2 F (36.8 C), temperature source Oral, resp. rate 18, height 5\' 5"  (1.651 m), weight 106.5 kg (234 lb 12.8 oz), last menstrual period 01/22/2012, SpO2 99 %. Body mass index is 39.07 kg/m. General: Well developed, well nourished, in no acute distress. Head: Normocephalic, atraumatic, sclera non-icteric, no xanthomas, nares are without discharge. Neck: No apparent masses Lungs: Normal respirations with diffuse wheezes, no rhonchi, no rales , some crackles   Heart: Regular rate and rhythm, normal S1 S2, no murmur, no rub, no gallop, PMI is normal size and placement, carotid upstroke normal without bruit, jugular venous pressure normal Abdomen: Soft, non-tender, distended with normoactive bowel sounds. No hepatosplenomegaly. Abdominal aorta is normal size without bruit Extremities 1+ edema, no clubbing, no cyanosis, no ulcers,  Peripheral: 2+ radial, 2+ femoral, 2+ dorsal pedal pulses Neuro: Alert and oriented. Moves all extremities spontaneously. Psych:  Responds to questions appropriately with a normal affect.   Intake/Output Summary (Last 24  hours) at 01/23/17 0857 Last data filed at 01/23/17 0306  Gross per 24 hour  Intake              480 ml  Output             1300 ml  Net             -820 ml    Inpatient Medications:  . aspirin  81 mg Oral Daily  . enoxaparin (LOVENOX) injection  40 mg Subcutaneous Q24H  . ipratropium-albuterol  3 mL Nebulization Q4H  . methylPREDNISolone (SOLU-MEDROL) injection  60 mg Intravenous Q6H  . rOPINIRole  4 mg Oral QHS   Infusions:   Labs:  Recent Labs  01/22/17 0419 01/23/17 0440  NA 138 139  K 3.6 4.6  CL 105 105  CO2 25 28  GLUCOSE 224* 166*  BUN 14 25*  CREATININE 0.97 0.90  CALCIUM 9.2 9.0    Recent Labs  01/21/17 1600  AST 26  ALT 26  ALKPHOS 87  BILITOT 0.4  PROT 7.2  ALBUMIN 3.7    Recent Labs  01/21/17 1600 01/22/17 0419  WBC 10.0 11.9*  NEUTROABS 7.2*  --   HGB 11.5* 11.4*  HCT 34.1* 34.7*  MCV 78.1* 80.0  PLT 277 264    Recent Labs  01/21/17 1600 01/21/17 2017  TROPONINI <0.03 <0.03   Invalid input(s): POCBNP No results for input(s): HGBA1C in the last 72 hours.   Weights: Filed Weights   01/22/17 0116 01/22/17 0416 01/23/17 0306  Weight: 105.7 kg (233 lb 1.6 oz) 105.7 kg (233 lb 1.6 oz) 106.5 kg (234 lb 12.8 oz)     Radiology/Studies:  Dg Chest 2 View  Result Date: 01/21/2017 CLINICAL DATA:  Asthma.  Difficulty breathing. EXAM: CHEST  2 VIEW COMPARISON:  December 08, 2016 FINDINGS: The heart  size is borderline to mildly enlarged. No pneumothorax. No pulmonary nodules or masses. No focal infiltrates. Haziness of the lung bases is likely due to body habitus. No overt edema. IMPRESSION: No active cardiopulmonary disease. Electronically Signed   By: Dorise Bullion III M.D   On: 01/21/2017 16:18   Nm Pulmonary Perf And Vent  Result Date: 01/21/2017 CLINICAL DATA:  Dyspnea EXAM: NUCLEAR MEDICINE VENTILATION - PERFUSION LUNG SCAN TECHNIQUE: Ventilation images were obtained in multiple projections using inhaled aerosol Tc-24m DTPA. Perfusion  images were obtained in multiple projections after intravenous injection of Tc-10m MAA. RADIOPHARMACEUTICALS:  31.9 mCi Technetium-21m DTPA aerosol inhalation and 3.9 mCi Technetium-2m MAA IV COMPARISON:  Correlation with chest radiographs dated 01/21/2017 FINDINGS: Ventilation: No focal ventilation defect. Perfusion: No wedge shaped peripheral perfusion defects to suggest acute pulmonary embolism. Corresponding chest radiographs are clear. IMPRESSION: Negative for pulmonary embolism. Electronically Signed   By: Julian Hy M.D.   On: 01/21/2017 22:00     Assessment and Recommendation  51 y.o. female with known moderate LV systolic dysfunction with acute on chronic systolic dysfunction congestive heart failure and combination of asthma for which the patient has had no evidence of myocardial infarction. This may be exacerbated by a dietary indiscretion and or other concerns for noncompliance 1. Continue furosemide for lower extremity edema pulmonary edema 2. Continue treatment of asthma and/or other hypoxia 3. Further consideration of use of ACE inhibitor if able for LV systolic dysfunction 4. Long discussion and further compliance of low sodium diet 5. Begin ambulation and follow for improvements and possible discharge to home from cardiac standpoint with follow-up in one to 2 weeks  Signed, Serafina Royals M.D. FACC

## 2017-01-23 NOTE — Care Management (Signed)
Patient without insurance.  Was followed by [facility] for two months but in order to continue in the program, she would have to move all of her care to [facility] which she did not want to do. She initiated a medicaid/disability application today.  Provided patient with [facility] applications.  let communication for attending and care team to speak directly with CM regarding discharge medications so can provide assist through the [facility]

## 2017-01-23 NOTE — Progress Notes (Signed)
Brittany Nguyen at [place] NAME: Brittany Nguyen    MR#:  [id number]  DATE OF BIRTH:  1965/10/13  SUBJECTIVE:   Patient here due to shortness of breath and cough. Patient has history of asthma and also congestive heart failure. Still having a cough but nonproductive, shortness of breath has improved. Family at bedside.  REVIEW OF SYSTEMS:    Review of Systems  Constitutional: Negative for chills and fever.  HENT: Negative for congestion and tinnitus.   Eyes: Negative for blurred vision and double vision.  Respiratory: Positive for cough and shortness of breath. Negative for wheezing.   Cardiovascular: Negative for chest pain, orthopnea and PND.  Gastrointestinal: Negative for abdominal pain, diarrhea, nausea and vomiting.  Genitourinary: Negative for dysuria and hematuria.  Neurological: Negative for dizziness, sensory change and focal weakness.  All other systems reviewed and are negative.   Nutrition: Heart healthy Tolerating Diet: Yes Tolerating PT: Ambulatory  DRUG ALLERGIES:   Allergies  Allergen Reactions  . Contrast Media [Iodinated Diagnostic Agents] Shortness Of Breath  . Latex Hives  . Ondansetron Other (See Comments)    Severe headache  . Penicillins Hives and Other (See Comments)    Has patient had a PCN reaction causing immediate rash, facial/tongue/throat swelling, SOB or lightheadedness with hypotension: Yes Has patient had a PCN reaction causing severe rash involving mucus membranes or skin necrosis: No Has patient had a PCN reaction that required hospitalization No Has patient had a PCN reaction occurring within the last 10 years: No If all of the above answers are "NO", then may proceed with Cephalosporin use.   . Povidone-Iodine Rash    VITALS:  Blood pressure 117/67, pulse (!) 55, temperature 97.9 F (36.6 C), temperature source Oral, resp. rate 20, height 5\' 5"  (1.651 m), weight 106.5 kg (234 lb 12.8 oz), last  menstrual period 01/22/2012, SpO2 94 %.  PHYSICAL EXAMINATION:   Physical Exam  GENERAL:  51 y.o.-year-old obese patient lying in bed in no acute distress.  EYES: Pupils equal, round, reactive to light and accommodation. No scleral icterus. Extraocular muscles intact.  HEENT: Head atraumatic, normocephalic. Oropharynx and nasopharynx clear.  NECK:  Supple, no jugular venous distention. No thyroid enlargement, no tenderness.  LUNGS: Normal breath sounds bilaterally, no wheezing, rales, rhonchi. No use of accessory muscles of respiration.  CARDIOVASCULAR: S1, S2 normal. No murmurs, rubs, or gallops.  ABDOMEN: Soft, nontender, nondistended. Bowel sounds present. No organomegaly or mass.  EXTREMITIES: No cyanosis, clubbing or edema b/l.    NEUROLOGIC: Cranial nerves II through XII are intact. No focal Motor or sensory deficits b/l.   PSYCHIATRIC: The patient is alert and oriented x 3.  SKIN: No obvious rash, lesion, or ulcer.    LABORATORY PANEL:   CBC  Recent Labs Lab 01/22/17 0419  WBC 11.9*  HGB 11.4*  HCT 34.7*  PLT 264   ------------------------------------------------------------------------------------------------------------------  Chemistries   Recent Labs Lab 01/21/17 1600  01/23/17 0440  NA 139  < > 139  K 3.5  < > 4.6  CL 104  < > 105  CO2 25  < > 28  GLUCOSE 120*  < > 166*  BUN 13  < > 25*  CREATININE 0.87  < > 0.90  CALCIUM 8.7*  < > 9.0  AST 26  --   --   ALT 26  --   --   ALKPHOS 87  --   --   BILITOT 0.4  --   --   < > =  values in this interval not displayed. ------------------------------------------------------------------------------------------------------------------  Cardiac Enzymes  Recent Labs Lab 01/21/17 2017  TROPONINI <0.03   ------------------------------------------------------------------------------------------------------------------  RADIOLOGY:  Dg Chest 2 View  Result Date: 01/21/2017 CLINICAL DATA:  Asthma.  Difficulty  breathing. EXAM: CHEST  2 VIEW COMPARISON:  December 08, 2016 FINDINGS: The heart size is borderline to mildly enlarged. No pneumothorax. No pulmonary nodules or masses. No focal infiltrates. Haziness of the lung bases is likely due to body habitus. No overt edema. IMPRESSION: No active cardiopulmonary disease. Electronically Signed   By: Dorise Bullion III M.D   On: 01/21/2017 16:18   Nm Pulmonary Perf And Vent  Result Date: 01/21/2017 CLINICAL DATA:  Dyspnea EXAM: NUCLEAR MEDICINE VENTILATION - PERFUSION LUNG SCAN TECHNIQUE: Ventilation images were obtained in multiple projections using inhaled aerosol Tc-50m DTPA. Perfusion images were obtained in multiple projections after intravenous injection of Tc-65m MAA. RADIOPHARMACEUTICALS:  31.9 mCi Technetium-53m DTPA aerosol inhalation and 3.9 mCi Technetium-39m MAA IV COMPARISON:  Correlation with chest radiographs dated 01/21/2017 FINDINGS: Ventilation: No focal ventilation defect. Perfusion: No wedge shaped peripheral perfusion defects to suggest acute pulmonary embolism. Corresponding chest radiographs are clear. IMPRESSION: Negative for pulmonary embolism. Electronically Signed   By: Julian Hy M.D.   On: 01/21/2017 22:00     ASSESSMENT AND PLAN:   51 year old female with past medical history of asthma, nonischemic cardiomyopathy with ejection fraction of 30% who presented to the hospital due to shortness of breath.  1. Acute respiratory failure with hypoxia-etiology unclear but suspected to be secondary to combination of asthma exacerbation also mild CHF. -Continue O2 supplementation, continue treatment for asthma exacerbation with IV steroids, DuoNeb's. -Continue oral Lasix for CHF. Assess for home oxygen prior to discharge.  2. Asthma exacerbation-continue IV steroids, will add scheduled DuoNeb's, Pulmicort nebs.  - slow to improve and will monitor.   3. CHF - acute on chronic systolic CHF.  - pt. Was on no meds prior to coming into  hospital.   - will start on Low dose, Coreg, ACE, Lasix and monitor. Discussed w/ Cards Dr. Nehemiah Massed.   4. Restless Leg syndrome - cont. Requip.       All the records are reviewed and case discussed with Care Management/Social Worker. Management plans discussed with the patient, family and they are in agreement.  CODE STATUS: Full code  DVT Prophylaxis: Lovenox  TOTAL TIME TAKING CARE OF THIS PATIENT: 30 minutes.   POSSIBLE D/C IN 1-2 DAYS, DEPENDING ON CLINICAL CONDITION.   Henreitta Leber M.D on 01/23/2017 at 3:41 PM  Between 7am to 6pm - Pager - [phone]  After 6pm go to [email or web address] - Proofreader  [facility]  Office  [phone]  CC: Primary care physician; [name], MD

## 2017-01-24 ENCOUNTER — Encounter: Payer: Self-pay | Admitting: *Deleted

## 2017-01-24 MED ORDER — FUROSEMIDE 40 MG PO TABS: 40.0000 mg | ORAL_TABLET | Freq: Every day | ORAL | 1 refills | Status: DC

## 2017-01-24 MED ORDER — LISINOPRIL 5 MG PO TABS: 5.0000 mg | ORAL_TABLET | Freq: Every day | ORAL | 1 refills | Status: DC

## 2017-01-24 MED ORDER — BUDESONIDE-FORMOTEROL FUMARATE 80-4.5 MCG/ACT IN AERO: 2.0000 | INHALATION_SPRAY | Freq: Two times a day (BID) | RESPIRATORY_TRACT | 1 refills | Status: DC

## 2017-01-24 MED ORDER — NITROGLYCERIN 0.4 MG SL SUBL
SUBLINGUAL_TABLET | SUBLINGUAL | Status: AC
  Administered 2017-01-24: 0.4 mg
  Filled 2017-01-24: qty 1

## 2017-01-24 MED ORDER — PREDNISONE 10 MG PO TABS
ORAL_TABLET | ORAL | 0 refills | Status: DC
Start: 2017-01-24 — End: 2017-01-30

## 2017-01-24 MED ORDER — NITROGLYCERIN 0.4 MG SL SUBL: 0.4000 mg | SUBLINGUAL_TABLET | SUBLINGUAL | Status: DC | PRN

## 2017-01-24 MED ORDER — FLUTICASONE-SALMETEROL 500-50 MCG/DOSE IN AEPB: 1.0000 | INHALATION_SPRAY | Freq: Two times a day (BID) | RESPIRATORY_TRACT | 0 refills | Status: DC

## 2017-01-24 MED ORDER — NITROGLYCERIN 0.4 MG SL SUBL
0.4000 mg | SUBLINGUAL_TABLET | SUBLINGUAL | 1 refills | Status: DC | PRN
Start: 2017-01-24 — End: 2021-09-28

## 2017-01-24 MED ORDER — CARVEDILOL 3.125 MG PO TABS: 3.1250 mg | ORAL_TABLET | Freq: Two times a day (BID) | ORAL | 1 refills | Status: DC

## 2017-01-24 NOTE — Progress Notes (Addendum)
Notified Dr. Verdell Carmine of chest pain. Pt reports same as yesterday. Stabbing pain that is a 9 and radiating to jaw. Per MD markers are negative okay to place order for Stat EKG. MD to place orders for nitro

## 2017-01-24 NOTE — Care Management (Signed)
Patient discharged home today.  Patient to pick up all discharge medications at [facility] after discharge.  I have confirmed that they are all in stock and faxed the scripts.  PCP [facility].  Patient provided application to [facility] and [facility].  RNCM signing off.

## 2017-01-24 NOTE — Progress Notes (Signed)
Leader Rounding on patient. Patient complained of chest pain of 9 radiating to jaw. Staff RN notified. Vital signs obtained. Remained with patient until nurse arrived. ELQ

## 2017-01-24 NOTE — Progress Notes (Signed)
Called Dr. Jannifer Franklin regarding order for cpap.  Orders were placed.  Christene Slates  01/24/2017  1:45 AM

## 2017-01-24 NOTE — Progress Notes (Signed)
Notified MD of EKG results

## 2017-01-24 NOTE — Discharge Summary (Signed)
[facility] [place] NAME: Brittany Nguyen    MR#:  [id number]  DATE OF BIRTH:  [birth year]  DATE OF ADMISSION:  01/21/2017 ADMITTING PHYSICIAN: Lance Coon, MD  DATE OF DISCHARGE: 01/24/2017  2:58 PM  PRIMARY CARE PHYSICIAN: [name], MD    ADMISSION DIAGNOSIS:  Shortness of breath [R06.02] Dyspnea [R06.00] COPD exacerbation (HCC) [J44.1] Nonspecific chest pain [R07.9]  DISCHARGE DIAGNOSIS:  Principal Problem:   Asthma exacerbation Active Problems:   GERD   Acute on chronic systolic CHF (congestive heart failure) ([place])   SECONDARY DIAGNOSIS:   Past Medical History:  Diagnosis Date  . Asthma 2013  . CHF (congestive heart failure) ([place])   . Diverticulitis 2010  . DVT (deep venous thrombosis) ([place])   . Endometriosis 1990  . Heart disease 2013  . Hx MRSA infection   . Lump or mass in breast   . Restless leg     HOSPITAL COURSE:   51 year old female with past medical history of asthma, nonischemic cardiomyopathy with ejection fraction of 30% who presented to the hospital due to shortness of breath.  1. Acute respiratory failure with hypoxia-etiology unclear but suspected to be secondary to combination of asthma exacerbation also mild CHF. -Patient was treated with IV steroids for asthma along with some duo nebs and also given some oral diuretics for her CHF. Her hypoxia and shortness of breath is improved. She did not qualify for home oxygen and now being discharged on some maintenance medications as mentioned below.  2. Asthma exacerbation-patient was treated with IV steroids, scheduled DuoNeb's, Pulmicort nebs and has improved. She is now being discharged on oral prednisone taper, albuterol inhaler and also Advair.  3. CHF - acute on chronic systolic CHF.  - pt. Was on no meds prior to coming into hospital.   -She was started on low-dose Coreg, lisinopril and Lasix is being discharged on that. She is to follow up with  Dr. Nehemiah Massed is an outpatient, she has a cardiomyopathy with EF of 35-40% but it's unclear if it is ischemic or nonischemic. She is acutely not having any chest pain but needs a ischemic workup to be done as an outpatient per cardiology.  4. Restless Leg syndrome - she will cont. Requip.  DISCHARGE CONDITIONS:   Stable.   CONSULTS OBTAINED:  Treatment Team:  Corey Skains, MD  DRUG ALLERGIES:   Allergies  Allergen Reactions  . Contrast Media [Iodinated Diagnostic Agents] Shortness Of Breath  . Latex Hives  . Ondansetron Other (See Comments)    Severe headache  . Penicillins Hives and Other (See Comments)    Has patient had a PCN reaction causing immediate rash, facial/tongue/throat swelling, SOB or lightheadedness with hypotension: Yes Has patient had a PCN reaction causing severe rash involving mucus membranes or skin necrosis: No Has patient had a PCN reaction that required hospitalization No Has patient had a PCN reaction occurring within the last 10 years: No If all of the above answers are "NO", then may proceed with Cephalosporin use.   . Povidone-Iodine Rash  . Pulmicort [Budesonide] Itching    DISCHARGE MEDICATIONS:   Allergies as of 01/24/2017      Reactions   Contrast Media [iodinated Diagnostic Agents] Shortness Of Breath   Latex Hives   Ondansetron Other (See Comments)   Severe headache   Penicillins Hives, Other (See Comments)   Has patient had a PCN reaction causing immediate rash, facial/tongue/throat swelling, SOB or lightheadedness with hypotension: Yes  Has patient had a PCN reaction causing severe rash involving mucus membranes or skin necrosis: No Has patient had a PCN reaction that required hospitalization No Has patient had a PCN reaction occurring within the last 10 years: No If all of the above answers are "NO", then may proceed with Cephalosporin use.   Povidone-iodine Rash   Pulmicort [budesonide] Itching      Medication List    TAKE  these medications   albuterol 108 (90 Base) MCG/ACT inhaler Commonly known as:  PROVENTIL HFA;VENTOLIN HFA Inhale 2 puffs into the lungs every 6 (six) hours as needed for wheezing or shortness of breath.   aspirin 81 MG chewable tablet Chew 1 tablet (81 mg total) by mouth daily.   carvedilol 3.125 MG tablet Commonly known as:  COREG Take 1 tablet (3.125 mg total) by mouth 2 (two) times daily with a meal.   Fluticasone-Salmeterol 500-50 MCG/DOSE Aepb Commonly known as:  ADVAIR DISKUS Inhale 1 puff into the lungs 2 (two) times daily.   furosemide 40 MG tablet Commonly known as:  LASIX Take 1 tablet (40 mg total) by mouth daily. Start taking on:  01/25/2017   lisinopril 5 MG tablet Commonly known as:  PRINIVIL,ZESTRIL Take 1 tablet (5 mg total) by mouth daily. Start taking on:  01/25/2017   nitroGLYCERIN 0.4 MG SL tablet Commonly known as:  NITROSTAT Place 1 tablet (0.4 mg total) under the tongue every 5 (five) minutes as needed for chest pain.   predniSONE 10 MG tablet Commonly known as:  DELTASONE Label  & dispense according to the schedule below. 5 Pills PO for 1 day then, 4 Pills PO for 1 day, 3 Pills PO for 1 day, 2 Pills PO for 1 day, 1 Pill PO for 1 days then STOP.   rOPINIRole 2 MG tablet Commonly known as:  REQUIP Take 1 tablet (2 mg total) by mouth at bedtime. What changed:  how much to take         DISCHARGE INSTRUCTIONS:   DIET:  Cardiac diet  DISCHARGE CONDITION:  Stable  ACTIVITY:  Activity as tolerated  OXYGEN:  Home Oxygen: No.   Oxygen Delivery: room air  DISCHARGE LOCATION:  home   If you experience worsening of your admission symptoms, develop shortness of breath, life threatening emergency, suicidal or homicidal thoughts you must seek medical attention immediately by calling 911 or calling your MD immediately  if symptoms less severe.  You Must read complete instructions/literature along with all the possible adverse reactions/side  effects for all the Medicines you take and that have been prescribed to you. Take any new Medicines after you have completely understood and accpet all the possible adverse reactions/side effects.   Please note  You were cared for by a hospitalist during your hospital stay. If you have any questions about your discharge medications or the care you received while you were in the hospital after you are discharged, you can call the unit and asked to speak with the hospitalist on call if the hospitalist that took care of you is not available. Once you are discharged, your primary care physician will handle any further medical issues. Please note that NO REFILLS for any discharge medications will be authorized once you are discharged, as it is imperative that you return to your primary care physician (or establish a relationship with a primary care physician if you do not have one) for your aftercare needs so that they can reassess your need for medications and monitor  your lab values.     Today   Shortness of breath much improved. + cough but non-productive. Feels better. Will d/c home today.   VITAL SIGNS:  Blood pressure 115/79, pulse 96, temperature 98.2 F (36.8 C), temperature source Oral, resp. rate 20, height 5\' 5"  (1.651 m), weight 105.9 kg (233 lb 6.4 oz), last menstrual period 01/22/2012, SpO2 98 %.  I/O:   Intake/Output Summary (Last 24 hours) at 01/24/17 1609 Last data filed at 01/24/17 1214  Gross per 24 hour  Intake              240 ml  Output             1300 ml  Net            -1060 ml    PHYSICAL EXAMINATION:   GENERAL:  51 y.o.-year-old obese patient lying in bed in no acute distress.  EYES: Pupils equal, round, reactive to light and accommodation. No scleral icterus. Extraocular muscles intact.  HEENT: Head atraumatic, normocephalic. Oropharynx and nasopharynx clear.  NECK:  Supple, no jugular venous distention. No thyroid enlargement, no tenderness.  LUNGS: Normal  breath sounds bilaterally, no wheezing, rales, rhonchi. No use of accessory muscles of respiration.  CARDIOVASCULAR: S1, S2 normal. No murmurs, rubs, or gallops.  ABDOMEN: Soft, nontender, nondistended. Bowel sounds present. No organomegaly or mass.  EXTREMITIES: No cyanosis, clubbing or edema b/l.    NEUROLOGIC: Cranial nerves II through XII are intact. No focal Motor or sensory deficits b/l.   PSYCHIATRIC: The patient is alert and oriented x 3.  SKIN: No obvious rash, lesion, or ulcer.   DATA REVIEW:   CBC  Recent Labs Lab 01/22/17 0419  WBC 11.9*  HGB 11.4*  HCT 34.7*  PLT 264    Chemistries   Recent Labs Lab 01/21/17 1600  01/23/17 0440  NA 139  < > 139  K 3.5  < > 4.6  CL 104  < > 105  CO2 25  < > 28  GLUCOSE 120*  < > 166*  BUN 13  < > 25*  CREATININE 0.87  < > 0.90  CALCIUM 8.7*  < > 9.0  AST 26  --   --   ALT 26  --   --   ALKPHOS 87  --   --   BILITOT 0.4  --   --   < > = values in this interval not displayed.  Cardiac Enzymes  Recent Labs Lab 01/21/17 2017  TROPONINI <0.03    Microbiology Results  Results for orders placed or performed during the hospital encounter of 12/08/16  MRSA PCR Screening     Status: None   Collection Time: 12/09/16  2:40 AM  Result Value Ref Range Status   MRSA by PCR NEGATIVE NEGATIVE Final    Comment:        The GeneXpert MRSA Assay (FDA approved for NASAL specimens only), is one component of a comprehensive MRSA colonization surveillance program. It is not intended to diagnose MRSA infection nor to guide or monitor treatment for MRSA infections.     RADIOLOGY:  No results found.    Management plans discussed with the patient, family and they are in agreement.  CODE STATUS:     Code Status Orders        Start     Ordered   01/22/17 0115  Full code  Continuous     01/22/17 0114    TOTAL TIME TAKING CARE OF  THIS PATIENT: 40 minutes.    Henreitta Leber M.D on 01/24/2017 at 4:09 PM  Between  7am to 6pm - Pager - [phone]  After 6pm go to [email or web address] - Proofreader  [facility]  Office  [phone]  CC: Primary care physician; [name], MD

## 2017-01-24 NOTE — Progress Notes (Signed)
Chest pain decreasing per pt report after 1 time administration of nitro. Will continue to monitor.

## 2017-01-24 NOTE — Progress Notes (Signed)
Pt A and O x 4. VSS. Pt tolerating diet well. No complaints of pain or nausea. IV removed intact, prescriptions given. RN case manager worked with pt on medications. Pt voiced understanding of discharge instructions with no further questions. Pt discharged via wheelchair with axillary.

## 2017-01-28 ENCOUNTER — Emergency Department: Payer: Medicaid Other

## 2017-01-28 ENCOUNTER — Inpatient Hospital Stay
Admission: EM | Admit: 2017-01-28 | Discharge: 2017-01-30 | DRG: 202 | Disposition: A | Discharge status: Home / Self Care | Payer: Medicaid Other | Attending: Internal Medicine | Admitting: Internal Medicine

## 2017-01-28 ENCOUNTER — Encounter: Payer: Self-pay | Admitting: Emergency Medicine

## 2017-01-28 DIAGNOSIS — J9601 Acute respiratory failure with hypoxia: Secondary | ICD-10-CM | POA: Diagnosis present

## 2017-01-28 DIAGNOSIS — Z91040 Latex allergy status: Secondary | ICD-10-CM | POA: Diagnosis not present

## 2017-01-28 DIAGNOSIS — R079 Chest pain, unspecified: Secondary | ICD-10-CM

## 2017-01-28 DIAGNOSIS — E876 Hypokalemia: Secondary | ICD-10-CM | POA: Diagnosis present

## 2017-01-28 DIAGNOSIS — Z883 Allergy status to other anti-infective agents status: Secondary | ICD-10-CM | POA: Diagnosis not present

## 2017-01-28 DIAGNOSIS — G4733 Obstructive sleep apnea (adult) (pediatric): Secondary | ICD-10-CM | POA: Diagnosis present

## 2017-01-28 DIAGNOSIS — I429 Cardiomyopathy, unspecified: Secondary | ICD-10-CM | POA: Diagnosis present

## 2017-01-28 DIAGNOSIS — Z79899 Other long term (current) drug therapy: Secondary | ICD-10-CM | POA: Diagnosis not present

## 2017-01-28 DIAGNOSIS — Z7951 Long term (current) use of inhaled steroids: Secondary | ICD-10-CM

## 2017-01-28 DIAGNOSIS — Z86718 Personal history of other venous thrombosis and embolism: Secondary | ICD-10-CM

## 2017-01-28 DIAGNOSIS — Z888 Allergy status to other drugs, medicaments and biological substances status: Secondary | ICD-10-CM | POA: Diagnosis not present

## 2017-01-28 DIAGNOSIS — Z91041 Radiographic dye allergy status: Secondary | ICD-10-CM

## 2017-01-28 DIAGNOSIS — N289 Disorder of kidney and ureter, unspecified: Secondary | ICD-10-CM | POA: Diagnosis present

## 2017-01-28 DIAGNOSIS — Z880 Allergy status to penicillin: Secondary | ICD-10-CM | POA: Diagnosis not present

## 2017-01-28 DIAGNOSIS — T380X5A Adverse effect of glucocorticoids and synthetic analogues, initial encounter: Secondary | ICD-10-CM | POA: Diagnosis present

## 2017-01-28 DIAGNOSIS — R0603 Acute respiratory distress: Secondary | ICD-10-CM | POA: Diagnosis present

## 2017-01-28 DIAGNOSIS — Z9989 Dependence on other enabling machines and devices: Secondary | ICD-10-CM

## 2017-01-28 DIAGNOSIS — Z6838 Body mass index (BMI) 38.0-38.9, adult: Secondary | ICD-10-CM

## 2017-01-28 DIAGNOSIS — D72829 Elevated white blood cell count, unspecified: Secondary | ICD-10-CM | POA: Diagnosis present

## 2017-01-28 DIAGNOSIS — R0600 Dyspnea, unspecified: Secondary | ICD-10-CM

## 2017-01-28 DIAGNOSIS — Z8614 Personal history of Methicillin resistant Staphylococcus aureus infection: Secondary | ICD-10-CM

## 2017-01-28 DIAGNOSIS — J45901 Unspecified asthma with (acute) exacerbation: Principal | ICD-10-CM | POA: Diagnosis present

## 2017-01-28 DIAGNOSIS — G2581 Restless legs syndrome: Secondary | ICD-10-CM | POA: Diagnosis present

## 2017-01-28 DIAGNOSIS — I5023 Acute on chronic systolic (congestive) heart failure: Secondary | ICD-10-CM | POA: Diagnosis present

## 2017-01-28 DIAGNOSIS — Z7982 Long term (current) use of aspirin: Secondary | ICD-10-CM

## 2017-01-28 DIAGNOSIS — J9600 Acute respiratory failure, unspecified whether with hypoxia or hypercapnia: Secondary | ICD-10-CM | POA: Diagnosis present

## 2017-01-28 DIAGNOSIS — R739 Hyperglycemia, unspecified: Secondary | ICD-10-CM | POA: Diagnosis not present

## 2017-01-28 LAB — BRAIN NATRIURETIC PEPTIDE: B Natriuretic Peptide: 181 pg/mL — ABNORMAL HIGH (ref 0.0–100.0)

## 2017-01-28 LAB — BASIC METABOLIC PANEL
Anion gap: 10 (ref 5–15)
BUN: 31 mg/dL — AB (ref 6–20)
CALCIUM: 8.6 mg/dL — AB (ref 8.9–10.3)
CHLORIDE: 101 mmol/L (ref 101–111)
CO2: 28 mmol/L (ref 22–32)
CREATININE: 0.99 mg/dL (ref 0.44–1.00)
GFR calc non Af Amer: >60 mL/min (ref >60)
Glucose, Bld: 118 mg/dL — ABNORMAL HIGH (ref 65–99)
Potassium: 3.2 mmol/L — ABNORMAL LOW (ref 3.5–5.1)
Sodium: 139 mmol/L (ref 135–145)

## 2017-01-28 LAB — BLOOD GAS, ARTERIAL
ACID-BASE EXCESS: 2.8 mmol/L — AB (ref 0.0–2.0)
Bicarbonate: 26.1 mmol/L (ref 20.0–28.0)
DELIVERY SYSTEMS: POSITIVE
EXPIRATORY PAP: 5
FIO2: 35
INSPIRATORY PAP: 12
O2 Saturation: 99.5 %
PCO2 ART: 35 mmHg (ref 32.0–48.0)
PH ART: 7.48 — AB (ref 7.350–7.450)
Patient temperature: 37
pO2, Arterial: 159 mmHg — ABNORMAL HIGH (ref 83.0–108.0)

## 2017-01-28 LAB — CBC
HCT: 36.9 % (ref 35.0–47.0)
Hemoglobin: 12 g/dL (ref 12.0–16.0)
MCH: 25.5 pg — AB (ref 26.0–34.0)
MCHC: 32.5 g/dL (ref 32.0–36.0)
MCV: 78.6 fL — AB (ref 80.0–100.0)
PLATELETS: 286 10*3/uL (ref 150–440)
RBC: 4.69 MIL/uL (ref 3.80–5.20)
RDW: 15.9 % — AB (ref 11.5–14.5)
WBC: 17.6 10*3/uL — ABNORMAL HIGH (ref 3.6–11.0)

## 2017-01-28 LAB — MRSA PCR SCREENING: MRSA BY PCR: NEGATIVE

## 2017-01-28 LAB — TROPONIN I

## 2017-01-28 LAB — MAGNESIUM: MAGNESIUM: 2 mg/dL (ref 1.7–2.4)

## 2017-01-28 MED ORDER — SODIUM CHLORIDE 0.9% FLUSH: 3.0000 mL | INTRAVENOUS | Status: DC | PRN

## 2017-01-28 MED ORDER — POTASSIUM CHLORIDE CRYS ER 20 MEQ PO TBCR
40.0000 meq | EXTENDED_RELEASE_TABLET | Freq: Once | ORAL | Status: AC
Start: 2017-01-28 — End: 2017-01-28
  Administered 2017-01-28: 40 meq via ORAL
  Filled 2017-01-28: qty 2

## 2017-01-28 MED ORDER — FUROSEMIDE 10 MG/ML IJ SOLN
40.0000 mg | Freq: Once | INTRAMUSCULAR | Status: AC
  Administered 2017-01-28: 40 mg via INTRAVENOUS
  Filled 2017-01-28: qty 4

## 2017-01-28 MED ORDER — LISINOPRIL 5 MG PO TABS
5.0000 mg | ORAL_TABLET | Freq: Every day | ORAL | Status: DC
  Administered 2017-01-28 – 2017-01-30 (×3): 5 mg via ORAL
  Filled 2017-01-28 (×3): qty 1

## 2017-01-28 MED ORDER — ENOXAPARIN SODIUM 40 MG/0.4ML ~~LOC~~ SOLN
40.0000 mg | SUBCUTANEOUS | Status: DC
  Administered 2017-01-28 – 2017-01-29 (×2): 40 mg via SUBCUTANEOUS
  Filled 2017-01-28 (×2): qty 0.4

## 2017-01-28 MED ORDER — METHYLPREDNISOLONE SODIUM SUCC 125 MG IJ SOLR
125.0000 mg | Freq: Once | INTRAMUSCULAR | Status: AC
  Administered 2017-01-28: 125 mg via INTRAVENOUS
  Filled 2017-01-28: qty 2

## 2017-01-28 MED ORDER — SPIRONOLACTONE 25 MG PO TABS
25.0000 mg | ORAL_TABLET | Freq: Every day | ORAL | Status: DC
  Administered 2017-01-28 – 2017-01-29 (×2): 25 mg via ORAL
  Filled 2017-01-28 (×2): qty 1

## 2017-01-28 MED ORDER — MOMETASONE FURO-FORMOTEROL FUM 200-5 MCG/ACT IN AERO
2.0000 | INHALATION_SPRAY | Freq: Two times a day (BID) | RESPIRATORY_TRACT | Status: DC
  Administered 2017-01-28 – 2017-01-30 (×4): 2 via RESPIRATORY_TRACT
  Filled 2017-01-28: qty 8.8

## 2017-01-28 MED ORDER — PROMETHAZINE HCL 25 MG/ML IJ SOLN
12.5000 mg | Freq: Three times a day (TID) | INTRAMUSCULAR | Status: DC | PRN
  Administered 2017-01-28 – 2017-01-30 (×5): 12.5 mg via INTRAVENOUS
  Filled 2017-01-28 (×5): qty 1

## 2017-01-28 MED ORDER — LEVOFLOXACIN IN D5W 500 MG/100ML IV SOLN
500.0000 mg | Freq: Every day | INTRAVENOUS | Status: DC
  Administered 2017-01-28 – 2017-01-29 (×2): 500 mg via INTRAVENOUS
  Filled 2017-01-28 (×3): qty 100

## 2017-01-28 MED ORDER — PROMETHAZINE HCL 25 MG/ML IJ SOLN
12.5000 mg | Freq: Once | INTRAMUSCULAR | Status: AC
  Administered 2017-01-28: 12.5 mg via INTRAVENOUS
  Filled 2017-01-28: qty 1

## 2017-01-28 MED ORDER — IPRATROPIUM-ALBUTEROL 0.5-2.5 (3) MG/3ML IN SOLN
3.0000 mL | Freq: Once | RESPIRATORY_TRACT | Status: AC
  Administered 2017-01-28: 3 mL via RESPIRATORY_TRACT

## 2017-01-28 MED ORDER — ROPINIROLE HCL 1 MG PO TABS
4.0000 mg | ORAL_TABLET | Freq: Every day | ORAL | Status: DC
  Administered 2017-01-28 – 2017-01-29 (×2): 4 mg via ORAL
  Filled 2017-01-28 (×2): qty 4

## 2017-01-28 MED ORDER — MORPHINE SULFATE (PF) 4 MG/ML IV SOLN
1.0000 mg | INTRAVENOUS | Status: DC | PRN
  Administered 2017-01-28 – 2017-01-30 (×7): 1 mg via INTRAVENOUS
  Filled 2017-01-28 (×7): qty 1

## 2017-01-28 MED ORDER — SODIUM CHLORIDE 0.9% FLUSH
3.0000 mL | Freq: Two times a day (BID) | INTRAVENOUS | Status: DC
  Administered 2017-01-28 – 2017-01-30 (×5): 3 mL via INTRAVENOUS

## 2017-01-28 MED ORDER — SODIUM CHLORIDE 0.9 % IV SOLN
250.0000 mL | INTRAVENOUS | Status: DC | PRN
  Administered 2017-01-28: 250 mL via INTRAVENOUS

## 2017-01-28 MED ORDER — ACETAMINOPHEN 650 MG RE SUPP: 650.0000 mg | Freq: Four times a day (QID) | RECTAL | Status: DC | PRN

## 2017-01-28 MED ORDER — IPRATROPIUM-ALBUTEROL 0.5-2.5 (3) MG/3ML IN SOLN
RESPIRATORY_TRACT | Status: AC
Start: 2017-01-28 — End: 2017-01-28
  Administered 2017-01-28: 3 mL via RESPIRATORY_TRACT
  Filled 2017-01-28: qty 3

## 2017-01-28 MED ORDER — ASPIRIN 81 MG PO CHEW
81.0000 mg | CHEWABLE_TABLET | Freq: Every day | ORAL | Status: DC
  Administered 2017-01-28 – 2017-01-30 (×3): 81 mg via ORAL
  Filled 2017-01-28 (×3): qty 1

## 2017-01-28 MED ORDER — CARVEDILOL 3.125 MG PO TABS
3.1250 mg | ORAL_TABLET | Freq: Two times a day (BID) | ORAL | Status: DC
  Administered 2017-01-29 – 2017-01-30 (×3): 3.125 mg via ORAL
  Filled 2017-01-28 (×4): qty 1

## 2017-01-28 MED ORDER — ALBUTEROL SULFATE HFA 108 (90 BASE) MCG/ACT IN AERS: 2.0000 | INHALATION_SPRAY | Freq: Four times a day (QID) | RESPIRATORY_TRACT | Status: DC | PRN

## 2017-01-28 MED ORDER — IPRATROPIUM-ALBUTEROL 0.5-2.5 (3) MG/3ML IN SOLN
3.0000 mL | Freq: Four times a day (QID) | RESPIRATORY_TRACT | Status: DC
  Administered 2017-01-28 – 2017-01-29 (×6): 3 mL via RESPIRATORY_TRACT
  Filled 2017-01-28 (×8): qty 3

## 2017-01-28 MED ORDER — NITROGLYCERIN 0.4 MG SL SUBL: 0.4000 mg | SUBLINGUAL_TABLET | SUBLINGUAL | Status: DC | PRN

## 2017-01-28 MED ORDER — ACETAMINOPHEN 325 MG PO TABS: 650.0000 mg | ORAL_TABLET | Freq: Four times a day (QID) | ORAL | Status: DC | PRN

## 2017-01-28 MED ORDER — ALBUTEROL SULFATE (2.5 MG/3ML) 0.083% IN NEBU
5.0000 mg/h | INHALATION_SOLUTION | Freq: Once | RESPIRATORY_TRACT | Status: AC
  Administered 2017-01-28: 5 mg/h via RESPIRATORY_TRACT
  Filled 2017-01-28: qty 3

## 2017-01-28 MED ORDER — METHYLPREDNISOLONE SODIUM SUCC 125 MG IJ SOLR
60.0000 mg | Freq: Four times a day (QID) | INTRAMUSCULAR | Status: DC
  Administered 2017-01-28 – 2017-01-30 (×8): 60 mg via INTRAVENOUS
  Filled 2017-01-28 (×8): qty 2

## 2017-01-28 MED ORDER — ALBUTEROL SULFATE (2.5 MG/3ML) 0.083% IN NEBU: 2.5000 mg | INHALATION_SOLUTION | RESPIRATORY_TRACT | Status: DC | PRN

## 2017-01-28 MED ORDER — FUROSEMIDE 40 MG PO TABS
40.0000 mg | ORAL_TABLET | Freq: Every day | ORAL | Status: DC
  Administered 2017-01-28 – 2017-01-30 (×3): 40 mg via ORAL
  Filled 2017-01-28 (×3): qty 1

## 2017-01-28 NOTE — Consult Note (Signed)
[facility] Consultation    ASSESSMENT/PLAN   Respiratory distress.  Multifactorial to include asthma exacerbation and systolic congestive heart failure. Recent echocardiogram showed reduced ejection fraction at 25-30%With diffuse hypokinesis. Agree with noninvasive ventilation, albuterol, Atrovent, dulera, Solu-Medrol, would empirically cover with Levaquin, diurese as able, serial EKG with cardiac enzymes, patient is on Lasix, lisinopril, spironolactone and Coreg. Aspirin has also been given.  Leukocytosis. Will add Levaquin  Hypokalemia, being replaced  Renal insufficiency  Morbid obesity with obstructive sleep apnea. On positive pressure ventilation  Prior history of DVT. On Lovenox prophylaxis. Recent VQ scan within normal limits  INTAKE / OUTPUT: No intake or output data in the 24 hours ending 01/28/17 1040   Name: Brittany Nguyen MRN: [id number] DOB: [birth year]    ADMISSION DATE:  01/28/2017 CONSULTATION DATE:  01/28/2017  REFERRING MD :  Hospitalist service  CHIEF COMPLAINT:  *Shortness of breath   HISTORY OF PRESENT ILLNESS:  Brittany Nguyen is a very pleasant 51 year old female with a past medical history remarkable for asthma, congestive heart failure with systolic dysfunction, history of DVT, restless leg syndrome, morbid obesity, sleep apnea recently discharged from the hospital for an asthma/CHF exacerbation. Was given a prednisone taper and last night developed worsening shortness of breath chest discomfort and wheezing this morning. She said 22 presented to the emergency department requiring BiPAP without significant improvement. She was given Lasix, Solu-Medrol and transferred to the intensive care unit. She states she has had low-grade fever, generally nonproductive sputum. Her home asthma regimen includes Advair, short-acting rescue albuterol, prednisone therapy, she is on Requip for her restless leg syndrome, Lasix, lisinopril and Coreg for  her congestive heart failure. She states she is feeling somewhat better although she still has some residual chest discomfort   PAST MEDICAL HISTORY :  Past Medical History:  Diagnosis Date  . Asthma 2013  . CHF (congestive heart failure) ([place])   . Diverticulitis 2010  . DVT (deep venous thrombosis) ([place])   . Endometriosis 1990  . Heart disease 2013  . Hx MRSA infection   . Lump or mass in breast   . Restless leg    Past Surgical History:  Procedure Laterality Date  . ABDOMINAL HYSTERECTOMY     age 89  . CESAREAN SECTION    . CHOLECYSTECTOMY    . COLECTOMY    . NASAL SINUS SURGERY  2012   Prior to Admission medications   Medication Sig Start Date End Date Taking? Authorizing Provider  albuterol (PROVENTIL HFA;VENTOLIN HFA) 108 (90 Base) MCG/ACT inhaler Inhale 2 puffs into the lungs every 6 (six) hours as needed for wheezing or shortness of breath. 06/06/16  Yes Alfred Levins, [place], MD  aspirin 81 MG chewable tablet Chew 1 tablet (81 mg total) by mouth daily. 12/09/16  Yes [name], MD  carvedilol (COREG) 3.125 MG tablet Take 1 tablet (3.125 mg total) by mouth 2 (two) times daily with a meal. 01/24/17  Yes Sainani, Belia Heman, MD  Fluticasone-Salmeterol (ADVAIR DISKUS) 500-50 MCG/DOSE AEPB Inhale 1 puff into the lungs 2 (two) times daily. 01/24/17 01/24/18 Yes Sainani, Belia Heman, MD  furosemide (LASIX) 40 MG tablet Take 1 tablet (40 mg total) by mouth daily. 01/25/17  Yes Sainani, Belia Heman, MD  lisinopril (PRINIVIL,ZESTRIL) 5 MG tablet Take 1 tablet (5 mg total) by mouth daily. 01/25/17  Yes Sainani, Belia Heman, MD  nitroGLYCERIN (NITROSTAT) 0.4 MG SL tablet Place 1 tablet (0.4 mg total) under the tongue every 5 (five) minutes as  needed for chest pain. 01/24/17  Yes Sainani, Belia Heman, MD  predniSONE (DELTASONE) 10 MG tablet Label  & dispense according to the schedule below. 5 Pills PO for 1 day then, 4 Pills PO for 1 day, 3 Pills PO for 1 day, 2 Pills PO for 1 day, 1 Pill PO for 1 days then STOP.  01/24/17  Yes Sainani, Belia Heman, MD  rOPINIRole (REQUIP) 2 MG tablet Take 1 tablet (2 mg total) by mouth at bedtime. Patient taking differently: Take 4 mg by mouth at bedtime.  05/14/15  Yes Johnn Hai, PA-C   Allergies  Allergen Reactions  . Contrast Media [Iodinated Diagnostic Agents] Shortness Of Breath  . Latex Hives  . Ondansetron Other (See Comments)    Severe headache  . Penicillins Hives and Other (See Comments)    Has patient had a PCN reaction causing immediate rash, facial/tongue/throat swelling, SOB or lightheadedness with hypotension: Yes Has patient had a PCN reaction causing severe rash involving mucus membranes or skin necrosis: No Has patient had a PCN reaction that required hospitalization No Has patient had a PCN reaction occurring within the last 10 years: No If all of the above answers are "NO", then may proceed with Cephalosporin use.   . Povidone-Iodine Rash  . Pulmicort [Budesonide] Itching    FAMILY HISTORY:  Family History  Problem Relation Age of Onset  . Cancer Mother 30       ovarian  . Cancer Father 54       brain  . Cancer Daughter 18       skin  . Cancer Maternal Aunt 17       breast   SOCIAL HISTORY:  reports that she has never smoked. She has never used smokeless tobacco. She reports that she does not drink alcohol or use drugs.  REVIEW OF SYSTEMS:   Please see history of present illness for list of review of systems her pertinent to her admission  The remainder of systems were reviewed and were found to be negative other than what is documented in the HPI.   VITAL SIGNS: Temp:  [99.1 F (37.3 C)] 99.1 F (37.3 C) (05/26 1033) Pulse Rate:  [95-102] 96 (05/26 1033) Resp:  [15-30] 15 (05/26 1033) BP: (111-153)/(68-120) 114/81 (05/26 1033) SpO2:  [98 %-100 %] 100 % (05/26 1033) Weight:  [105.7 kg (233 lb)-105.7 kg (233 lb 0.4 oz)] 105.7 kg (233 lb 0.4 oz) (05/26 1033)  Physical Examination:   VS: BP 114/81   Pulse 96   Temp 99.1  F (37.3 C) (Axillary)   Resp 15   Ht 5\' 5"  (1.651 m)   Wt 105.7 kg (233 lb 0.4 oz)   LMP 01/22/2012 (Approximate) Comment: Hysterectomy 5 years ago  SpO2 100%   BMI 38.78 kg/m   General Appearance: Patient presently in respiratory distress on noninvasive ventilation, morbidly obese Neuro:without focal findings, mental status, speech normal,. HEENT: PERRLA, EOM intact, no ptosis, no other lesions noticed;  Pulmonary: Diminished breath sounds bilaterally, prolonged expiratory phase, mild wheezing noted Cardiovascular: Normal S1,S2.  No m/r/g.    Abdomen: Benign, Soft, non-tender, No masses, hepatosplenomegaly, No lymphadenopathy Renal:  No costovertebral tenderness  Skin:   warm, no rashes, no ecchymosis  Extremities:no cyanosis, clubbing, positive lower extremity edema, warm with normal capillary refill.    LABS: Reviewed   LABORATORY PANEL:   CBC  Recent Labs Lab 01/28/17 0836  WBC 17.6*  HGB 12.0  HCT 36.9  PLT 286  Chemistries   Recent Labs Lab 01/21/17 1600  01/28/17 0836  NA 139  < > 139  K 3.5  < > 3.2*  CL 104  < > 101  CO2 25  < > 28  GLUCOSE 120*  < > 118*  BUN 13  < > 31*  CREATININE 0.87  < > 0.99  CALCIUM 8.7*  < > 8.6*  AST 26  --   --   ALT 26  --   --   ALKPHOS 87  --   --   BILITOT 0.4  --   --   < > = values in this interval not displayed.  No results for input(s): GLUCAP in the last 168 hours.  Recent Labs Lab 01/28/17 0856  PHART 7.48*  PCO2ART 35  PO2ART 159*    Recent Labs Lab 01/21/17 1600  AST 26  ALT 26  ALKPHOS 87  BILITOT 0.4  ALBUMIN 3.7    Cardiac Enzymes  Recent Labs Lab 01/28/17 0836  TROPONINI <0.03    RADIOLOGY:  Dg Chest Portable 1 View  Result Date: 01/28/2017 CLINICAL DATA:  Chest pain. EXAM: PORTABLE CHEST 1 VIEW COMPARISON:  Radiographs Jan 21, 2017. FINDINGS: Stable cardiomediastinal silhouette. No pneumothorax or pleural effusion is noted. Both lungs are clear. The visualized skeletal  structures are unremarkable. IMPRESSION: No acute cardiopulmonary abnormality seen. Electronically Signed   By: Marijo Conception, M.D.   On: 01/28/2017 09:01     01/28/2017, 10:40 AM

## 2017-01-28 NOTE — ED Triage Notes (Signed)
Patient to ER for c/o midsternal chest pain with N/V that began at approx 0000 this am.

## 2017-01-28 NOTE — ED Provider Notes (Addendum)
[facility] Emergency Department Provider Note  ____________________________________________   I have reviewed the triage vital signs and the nursing notes.   HISTORY  Chief Complaint Chest Pain    HPI Brittany Nguyen is a 51 y.o. female with a history of morbid obesity, asthma, CHF, multiple allergies, remote history of DVT, endometriosis, restless leg syndrome, last echo showed EF of 25 to30%, with diffuse hypokinesis in early April of this year presents today complaining of shortness of breath. Patient states she was on her BiPAP this morning around 4 AM and became gradually more dyspneic. Patient states it feels more like asthma than anything. She has a discomfort in her chest which she describes as a tightness consistent with multiple prior asthmatic attacks. Patient was just discharged for very similar presentation a few days ago. States that she felt somewhat better after going home. Was on steroids. Chest pain as a tightness which is nonradiating. Patient does have a intolerance or allergy to multiple different medications including unfortunately IV contrast, therefore she did have a VQ scan less than a week ago which was negative for these same symptoms. She has had prior negative V/Q scans in the past for similar presentations. Patient gives very limited history, she just states that she is short of breath and feels "tight". Nothing makes it better and nothing makes it worse, cLevel 5 chart caveat; no further history available due to patient status. Patient does have increased lower showed a swelling become 22 she's gained weight.  Past Medical History:  Diagnosis Date  . Asthma 2013  . CHF (congestive heart failure) ([place])   . Diverticulitis 2010  . DVT (deep venous thrombosis) ([place])   . Endometriosis 1990  . Heart disease 2013  . Hx MRSA infection   . Lump or mass in breast   . Restless leg     Patient Active Problem List   Diagnosis Date Noted   . Asthma exacerbation 01/21/2017  . Chest pain 12/09/2016  . Acute on chronic systolic CHF (congestive heart failure) ([place]) 12/09/2016  . RLS (restless legs syndrome) 12/09/2016  . Acute bronchitis 11/22/2015  . Cough, persistent 11/22/2015  . Hypokalemia 11/22/2015  . Leukocytosis 11/22/2015  . Dyspnea 11/21/2015  . Respiratory distress 09/19/2015  . Breast pain 01/11/2013  . Hx MRSA infection   . Lump or mass in breast   . GOITER, MULTINODULAR 01/20/2009  . HYPOGLYCEMIA, UNSPECIFIED 01/20/2009  . GERD 01/20/2009  . DIVERTICULITIS OF COLON 01/20/2009    Past Surgical History:  Procedure Laterality Date  . ABDOMINAL HYSTERECTOMY     age 68  . CESAREAN SECTION    . CHOLECYSTECTOMY    . COLECTOMY    . NASAL SINUS SURGERY  2012    Prior to Admission medications   Medication Sig Start Date End Date Taking? Authorizing Provider  albuterol (PROVENTIL HFA;VENTOLIN HFA) 108 (90 Base) MCG/ACT inhaler Inhale 2 puffs into the lungs every 6 (six) hours as needed for wheezing or shortness of breath. 06/06/16   Rudene Re, MD  aspirin 81 MG chewable tablet Chew 1 tablet (81 mg total) by mouth daily. 12/09/16   [name], MD  carvedilol (COREG) 3.125 MG tablet Take 1 tablet (3.125 mg total) by mouth 2 (two) times daily with a meal. 01/24/17   Sainani, Belia Heman, MD  Fluticasone-Salmeterol (ADVAIR DISKUS) 500-50 MCG/DOSE AEPB Inhale 1 puff into the lungs 2 (two) times daily. 01/24/17 01/24/18  Henreitta Leber, MD  furosemide (LASIX) 40 MG tablet  Take 1 tablet (40 mg total) by mouth daily. 01/25/17   Henreitta Leber, MD  lisinopril (PRINIVIL,ZESTRIL) 5 MG tablet Take 1 tablet (5 mg total) by mouth daily. 01/25/17   Henreitta Leber, MD  nitroGLYCERIN (NITROSTAT) 0.4 MG SL tablet Place 1 tablet (0.4 mg total) under the tongue every 5 (five) minutes as needed for chest pain. 01/24/17   Henreitta Leber, MD  predniSONE (DELTASONE) 10 MG tablet Label  & dispense according to the schedule below. 5  Pills PO for 1 day then, 4 Pills PO for 1 day, 3 Pills PO for 1 day, 2 Pills PO for 1 day, 1 Pill PO for 1 days then STOP. 01/24/17   Sainani, Belia Heman, MD  rOPINIRole (REQUIP) 2 MG tablet Take 1 tablet (2 mg total) by mouth at bedtime. Patient taking differently: Take 4 mg by mouth at bedtime.  05/14/15   Johnn Hai, PA-C    Allergies Contrast media [iodinated diagnostic agents]; Latex; Ondansetron; Penicillins; Povidone-iodine; and Pulmicort [budesonide]  Family History  Problem Relation Age of Onset  . Cancer Mother 30       ovarian  . Cancer Father 45       brain  . Cancer Daughter 18       skin  . Cancer Maternal Aunt 47       breast    Social History Social History  Substance Use Topics  . Smoking status: Never Smoker  . Smokeless tobacco: Never Used  . Alcohol use No    Review of Systems Constitutional: No fever/chills Eyes: No visual changes. ENT: No sore throat. No stiff neck no neck pain Cardiovascular: See history of present illness Respiratory: Positive shortness of breath. Gastrointestinal:   Positive vomiting.  No diarrhea.  No constipation. Genitourinary: Negative for dysuria. Musculoskeletal: Negative lower extremity swelling Skin: Negative for rash. Neurological: Negative for severe headaches, focal weakness or numbness. 10-point ROS otherwise negative.  ____________________________________________   PHYSICAL EXAM:  VITAL SIGNS: ED Triage Vitals  Enc Vitals Group     BP 01/28/17 0842 113/68     Pulse Rate 01/28/17 0851 96     Resp 01/28/17 0833 (!) 29     Temp --      Temp Source 01/28/17 0833 Oral     SpO2 01/28/17 0851 98 %     Weight 01/28/17 0834 233 lb (105.7 kg)     Height 01/28/17 0834 5\' 5"  (1.651 m)     Head Circumference --      Peak Flow --      Pain Score 01/28/17 0833 8     Pain Loc --      Pain Edu? --      Excl. in [place]? --     Constitutional: Alert and oriented. She has very very anxious, hyperventilating, she seems  emotionally upset and in moderate to significant respiratory distress Eyes: Conjunctivae are normal. PERRL. EOMI. Head: Atraumatic. Nose: No congestion/rhinnorhea. Mouth/Throat: Mucous membranes are moist.  Oropharynx non-erythematous. Neck: No stridor.   Nontender with no meningismus Cardiovascular: Normal rate, regular rhythm. Grossly normal heart sounds.  Good peripheral circulation. Respiratory: Increased respiratory effort, lungs show diffuse coarse breath sounds with diminished in the bases. Occasional wheeze noted..  Tachypnea noted. Abdominal: Soft and nontender. No distention. No guarding no rebound Back:  There is no focal tenderness or step off.  there is no midline tenderness there are no lesions noted. there is no CVA tenderness Musculoskeletal: No lower extremity tenderness,  no upper extremity tenderness. No joint effusions, no DVT signs strong distal pulses no edema Neurologic:  Normal speech and language. No gross focal neurologic deficits are appreciated.  Skin:  Skin is warm, dry and intact. No rash noted. Psychiatric: Mood and affect are anxious. Speech and behavior are normal.  ____________________________________________   LABS (all labs ordered are listed, but only abnormal results are displayed)  Labs Reviewed  BASIC METABOLIC PANEL - Abnormal; Notable for the following:       Result Value   Potassium 3.2 (*)    Glucose, Bld 118 (*)    BUN 31 (*)    Calcium 8.6 (*)    All other components within normal limits  CBC - Abnormal; Notable for the following:    WBC 17.6 (*)    MCV 78.6 (*)    MCH 25.5 (*)    RDW 15.9 (*)    All other components within normal limits  TROPONIN I  BLOOD GAS, ARTERIAL  BRAIN NATRIURETIC PEPTIDE   ____________________________________________  EKG  I personally interpreted any EKGs ordered by me or triage This tach rate 102, LAD noted, low voltage noted, no acute ST elevation or  depression. ____________________________________________  [id number]  I reviewed any imaging ordered by me or triage that were performed during my shift and, if possible, patient and/or family made aware of any abnormal findings. ____________________________________________   PROCEDURES  Procedure(s) performed: None  Procedures  Critical Care performed: CRITICAL CARE Performed by: Schuyler Amor   Total critical care time: 35 minutes  Critical care time was exclusive of separately billable procedures and treating other patients.  Critical care was necessary to treat or prevent imminent or life-threatening deterioration.  Critical care was time spent personally by me on the following activities: development of treatment plan with patient and/or surrogate as well as nursing, discussions with consultants, evaluation of patient's response to treatment, examination of patient, obtaining history from patient or surrogate, ordering and performing treatments and interventions, ordering and review of laboratory studies, ordering and review of radiographic studies, pulse oximetry and re-evaluation of patient's condition.   ____________________________________________   INITIAL IMPRESSION / ASSESSMENT AND PLAN / ED COURSE  Pertinent labs & imaging results that were available during my care of the patient were reviewed by me and considered in my medical decision making (see chart for details).  Patient here with respiratory distress, compounded by anxiety. She has chest tightness which is diffuse and similar to multiple prior presentations. Troponin fortunately is negative despite symptoms for 4 hours. EKG doesn't show acute ischemic changes. I immediately placed her on BiPAP which I think is helped her respiratory rate and her oxygenation has been good since she arrived. We have given her Lasix, Solu-Medrol, and albuterol and she looks more comfortable and is improving. I have added a BNP  onto her blood work. Although among the host of different reasons this patient may have for having dyspnea, a PE is certainly part of the differential, there is a negative VQ scan from less than a week ago with very similar symptoms, she has an allergy to contrast and I'm not certain she can lie flat for an imaging procedure at this time. She may require that in the future as an inpatient bed at this time we will defer given multiple other reasons including CHF and asthma and morbid obesity. Chest x-ray is reassuring no evidence of pneumonia. White count elevated but she is on steroids. Patient will likely require admission for further  intervention.  ----------------------------------------- 9:29 AM on 01/28/2017 -----------------------------------------  Patient in no acute distress at this time much more comfortable. We are paging the hospitalist for admission workup thus far as reassuring.    ____________________________________________   FINAL CLINICAL IMPRESSION(S) / ED DIAGNOSES  Final diagnoses:  None      This chart was dictated using voice recognition software.  Despite best efforts to proofread,  errors can occur which can change meaning.      Schuyler Amor, MD 01/28/17 9201    Schuyler Amor, MD 01/28/17 [phone]

## 2017-01-28 NOTE — H&P (Addendum)
[facility] [facility] [place] NAME: Brittany Nguyen    MR#:  [id number]  DATE OF BIRTH:  1966-06-30  DATE OF ADMISSION:  01/28/2017  PRIMARY CARE PHYSICIAN: [name], MD   REQUESTING/REFERRING PHYSICIAN: Schuyler Amor, MD  CHIEF COMPLAINT:   Chief Complaint  Patient presents with  . Chest Pain   Chest tightness, cough and wheezing today HISTORY OF PRESENT ILLNESS:  Brittany Nguyen  is a 52 y.o. female with a known history of Asthma, chronic systolic CHF, remote history of DVT, morbid obesity. The patient was just discharged to home after treatment of asthma exacerbation and CHF. She was given prednisone taper. She started to feel chest tightness and worsening shortness of breath and wheezing this morning. She was on BiPAP this morning without improvement. She is put on BiPAP in the ED, given Lasix and IV Solu-Medrol. She had negative VQ scan 1 week ago.  PAST MEDICAL HISTORY:   Past Medical History:  Diagnosis Date  . Asthma 2013  . CHF (congestive heart failure) ([place])   . Diverticulitis 2010  . DVT (deep venous thrombosis) ([place])   . Endometriosis 1990  . Heart disease 2013  . Hx MRSA infection   . Lump or mass in breast   . Restless leg     PAST SURGICAL HISTORY:   Past Surgical History:  Procedure Laterality Date  . ABDOMINAL HYSTERECTOMY     age 80  . CESAREAN SECTION    . CHOLECYSTECTOMY    . COLECTOMY    . NASAL SINUS SURGERY  2012    SOCIAL HISTORY:   Social History  Substance Use Topics  . Smoking status: Never Smoker  . Smokeless tobacco: Never Used  . Alcohol use No    FAMILY HISTORY:   Family History  Problem Relation Age of Onset  . Cancer Mother 30       ovarian  . Cancer Father 54       brain  . Cancer Daughter 18       skin  . Cancer Maternal Aunt 34       breast    DRUG ALLERGIES:   Allergies  Allergen Reactions  . Contrast Media [Iodinated Diagnostic Agents] Shortness Of Breath  . Latex  Hives  . Ondansetron Other (See Comments)    Severe headache  . Penicillins Hives and Other (See Comments)    Has patient had a PCN reaction causing immediate rash, facial/tongue/throat swelling, SOB or lightheadedness with hypotension: Yes Has patient had a PCN reaction causing severe rash involving mucus membranes or skin necrosis: No Has patient had a PCN reaction that required hospitalization No Has patient had a PCN reaction occurring within the last 10 years: No If all of the above answers are "NO", then may proceed with Cephalosporin use.   . Povidone-Iodine Rash  . Pulmicort [Budesonide] Itching    REVIEW OF SYSTEMS:   Review of Systems  Constitutional: Positive for malaise/fatigue. Negative for chills and fever.  HENT: Negative for congestion.   Eyes: Negative for blurred vision and double vision.  Respiratory: Positive for cough, sputum production, shortness of breath and wheezing. Negative for hemoptysis and stridor.   Cardiovascular: Negative for palpitations and leg swelling.       Chest tightness.  Gastrointestinal: Negative for abdominal pain, blood in stool, diarrhea, melena, nausea and vomiting.  Genitourinary: Negative for dysuria, frequency and hematuria.  Musculoskeletal: Negative for back pain.  Skin: Negative for itching and  rash.  Neurological: Negative for dizziness, focal weakness, loss of consciousness, weakness and headaches.  Psychiatric/Behavioral: Negative for depression. The patient is nervous/anxious.     MEDICATIONS AT HOME:   Prior to Admission medications   Medication Sig Start Date End Date Taking? Authorizing Provider  albuterol (PROVENTIL HFA;VENTOLIN HFA) 108 (90 Base) MCG/ACT inhaler Inhale 2 puffs into the lungs every 6 (six) hours as needed for wheezing or shortness of breath. 06/06/16  Yes Alfred Levins, [place], MD  aspirin 81 MG chewable tablet Chew 1 tablet (81 mg total) by mouth daily. 12/09/16  Yes [name], MD  carvedilol (COREG) 3.125  MG tablet Take 1 tablet (3.125 mg total) by mouth 2 (two) times daily with a meal. 01/24/17  Yes Sainani, Belia Heman, MD  Fluticasone-Salmeterol (ADVAIR DISKUS) 500-50 MCG/DOSE AEPB Inhale 1 puff into the lungs 2 (two) times daily. 01/24/17 01/24/18 Yes Sainani, Belia Heman, MD  furosemide (LASIX) 40 MG tablet Take 1 tablet (40 mg total) by mouth daily. 01/25/17  Yes Sainani, Belia Heman, MD  lisinopril (PRINIVIL,ZESTRIL) 5 MG tablet Take 1 tablet (5 mg total) by mouth daily. 01/25/17  Yes Sainani, Belia Heman, MD  nitroGLYCERIN (NITROSTAT) 0.4 MG SL tablet Place 1 tablet (0.4 mg total) under the tongue every 5 (five) minutes as needed for chest pain. 01/24/17  Yes Sainani, Belia Heman, MD  predniSONE (DELTASONE) 10 MG tablet Label  & dispense according to the schedule below. 5 Pills PO for 1 day then, 4 Pills PO for 1 day, 3 Pills PO for 1 day, 2 Pills PO for 1 day, 1 Pill PO for 1 days then STOP. 01/24/17  Yes Sainani, Belia Heman, MD  rOPINIRole (REQUIP) 2 MG tablet Take 1 tablet (2 mg total) by mouth at bedtime. Patient taking differently: Take 4 mg by mouth at bedtime.  05/14/15  Yes Summers, Rhonda L, PA-C      VITAL SIGNS:  Blood pressure 121/84, pulse (!) 102, resp. rate 19, height 5\' 5"  (1.651 m), weight 233 lb (105.7 kg), last menstrual period 01/22/2012, SpO2 100 %.  PHYSICAL EXAMINATION:  Physical Exam  GENERAL:  51 y.o.-year-old patient lying in the bed with no acute distress. Morbid obese. EYES: Pupils equal, round, reactive to light and accommodation. No scleral icterus. Extraocular muscles intact.  HEENT: Head atraumatic, normocephalic. Oropharynx and nasopharynx clear.  NECK:  Supple, no jugular venous distention. No thyroid enlargement, no tenderness.  LUNGS: Diminished breath sounds bilaterally, no wheezing, rales,rhonchi or crepitation. No use of accessory muscles of respiration.  CARDIOVASCULAR: S1, S2 normal. No murmurs, rubs, or gallops.  ABDOMEN: Soft, nontender, nondistended. Bowel sounds present. No  organomegaly or mass.  EXTREMITIES: No pedal edema, cyanosis, or clubbing.  NEUROLOGIC: Cranial nerves II through XII are intact. Muscle strength 5/5 in all extremities. Sensation intact. Gait not checked.  PSYCHIATRIC: The patient is alert and oriented x 3.  SKIN: No obvious rash, lesion, or ulcer.   LABORATORY PANEL:   CBC  Recent Labs Lab 01/28/17 0836  WBC 17.6*  HGB 12.0  HCT 36.9  PLT 286   ------------------------------------------------------------------------------------------------------------------  Chemistries   Recent Labs Lab 01/21/17 1600  01/28/17 0836  NA 139  < > 139  K 3.5  < > 3.2*  CL 104  < > 101  CO2 25  < > 28  GLUCOSE 120*  < > 118*  BUN 13  < > 31*  CREATININE 0.87  < > 0.99  CALCIUM 8.7*  < > 8.6*  AST 26  --   --  ALT 26  --   --   ALKPHOS 87  --   --   BILITOT 0.4  --   --   < > = values in this interval not displayed. ------------------------------------------------------------------------------------------------------------------  Cardiac Enzymes  Recent Labs Lab 01/28/17 0836  TROPONINI <0.03   ------------------------------------------------------------------------------------------------------------------  RADIOLOGY:  Dg Chest Portable 1 View  Result Date: 01/28/2017 CLINICAL DATA:  Chest pain. EXAM: PORTABLE CHEST 1 VIEW COMPARISON:  Radiographs Jan 21, 2017. FINDINGS: Stable cardiomediastinal silhouette. No pneumothorax or pleural effusion is noted. Both lungs are clear. The visualized skeletal structures are unremarkable. IMPRESSION: No acute cardiopulmonary abnormality seen. Electronically Signed   By: Marijo Conception, M.D.   On: 01/28/2017 09:01      IMPRESSION AND PLAN:   Acute respiratory failure with hypoxia due to asthma exacerbation. The patient will be admitted to stepdown unit. Start IV Solu-Medrol, DuoNeb every 6 hours, try to wean off BiPAP. Intensivist consult.  Hypokalemia. Give potassium supplement,  follow-up BMP and magnesium level.  Leukocytosis. Possible due to previous prednisone.  Acute on chronic hronic systolic CHF. Ejection fraction 25%. Continue Lasix, Coreg and lisinopril, add spironolactone.  Morbid obesity.  All the records are reviewed and case discussed with ED provider. Management plans discussed with the patient, family and they are in agreement.  CODE STATUS: full code.  TOTAL Critical TIME TAKING CARE OF THIS PATIENT: 56 minutes.    [name] M.D on 01/28/2017 at 10:03 AM  Between 7am to 6pm - Pager - [phone]  After 6pm go to [email or web address] - Proofreader  [facility]  Office  [phone]  CC: Primary care physician; [name], MD   Note: This dictation was prepared with Dragon dictation along with smaller phrase technology. Any transcriptional errors that result from this process are unintentional.

## 2017-01-29 LAB — TROPONIN I: Troponin I: <0.03 ng/mL (ref <0.03)

## 2017-01-29 LAB — CBC
HCT: 36.6 % (ref 35.0–47.0)
Hemoglobin: 12.1 g/dL (ref 12.0–16.0)
MCH: 26.1 pg (ref 26.0–34.0)
MCHC: 33 g/dL (ref 32.0–36.0)
MCV: 79.1 fL — ABNORMAL LOW (ref 80.0–100.0)
PLATELETS: 236 10*3/uL (ref 150–440)
RBC: 4.63 MIL/uL (ref 3.80–5.20)
RDW: 15.2 % — ABNORMAL HIGH (ref 11.5–14.5)
WBC: 14.9 10*3/uL — ABNORMAL HIGH (ref 3.6–11.0)

## 2017-01-29 LAB — MAGNESIUM: MAGNESIUM: 2.2 mg/dL (ref 1.7–2.4)

## 2017-01-29 LAB — BASIC METABOLIC PANEL
Anion gap: 7 (ref 5–15)
BUN: 25 mg/dL — ABNORMAL HIGH (ref 6–20)
CALCIUM: 8.7 mg/dL — AB (ref 8.9–10.3)
CO2: 29 mmol/L (ref 22–32)
CREATININE: 0.85 mg/dL (ref 0.44–1.00)
Chloride: 101 mmol/L (ref 101–111)
Glucose, Bld: 169 mg/dL — ABNORMAL HIGH (ref 65–99)
Potassium: 4 mmol/L (ref 3.5–5.1)
Sodium: 137 mmol/L (ref 135–145)

## 2017-01-29 LAB — PHOSPHORUS: PHOSPHORUS: 4.3 mg/dL (ref 2.5–4.6)

## 2017-01-29 MED ORDER — GUAIFENESIN 100 MG/5ML PO SOLN
5.0000 mL | ORAL | Status: DC | PRN
  Administered 2017-01-29 (×2): 100 mg via ORAL
  Filled 2017-01-29 (×3): qty 5

## 2017-01-29 NOTE — Progress Notes (Signed)
Report called to Ashley, RN on 2A.   

## 2017-01-29 NOTE — Progress Notes (Signed)
Patient seen earlier for svn treatment. bipap on sb. She states she will have RN to call if she wants to use tonight

## 2017-01-29 NOTE — Progress Notes (Signed)
RN spoke with Dr. Jefferson Fuel and made MD aware that patient is complaining of nausea and states that zofran does not work for her and per PRN order RN can not given phenergan for another 20 minutes.  Dr. Jefferson Fuel gave order to go ahead and give phenergan dose now.

## 2017-01-29 NOTE — Progress Notes (Signed)
[facility] [place] Critical Care Medicine Progess Note    SYNOPSIS   Brittany Nguyen is a very pleasant 51 year old female with a past medical history remarkable for asthma, congestive heart failure with systolic dysfunction, history of DVT, restless leg syndrome, morbid obesity, sleep apnea recently discharged from the hospital for an asthma/CHF exacerbation. Was given a prednisone taper and last night developed worsening shortness of breath chest discomfort and wheezing this morning. She said 20 presented to the emergency department requiring BiPAP without significant improvement. She was given Lasix, Solu-Medrol and transferred to the intensive care unit. She states she has had low-grade fever, generally nonproductive sputum. Her home asthma regimen includes Advair, short-acting rescue albuterol, prednisone therapy, she is on Requip for her restless leg syndrome, Lasix, lisinopril and Coreg for her congestive heart failure. She states she is feeling somewhat better although she still has some residual chest discomfort  ASSESSMENT/PLAN   Respiratory distress. She is symptomatically much improved. Has been weaned successfully off of noninvasive ventilation presently on nasal cannula. We'll continue Solu-Medrol, Levaquin, albuterol, Atrovent,Dulera and stable for floor transfer. Would complete a seven-day course of Levaquin, convert to redness on taper, home Advair with rescue albuterol, continue nocturnal CPAP and follow-up outpatient pulmonary clinic. At this point we'll sign off. Please reconsult if can be of any assistance.  Cardiomyopathy. Patient's underlying ejection fraction is 25-30%, presently on Lasix, lisinopril, spironolactone, Coreg and aspirin. Patient had a 12 beat run of V. tach last night, troponin negative, with stable electrolytes. With underlying low ejection fraction high risk for ventricular arrhythmias  Objective sleep apnea. Patient is on home CPAP  Mild prerenal indices.  Stable  Hyperglycemia. On coverage  Leukocytosis. Patient is appear clear on Levaquin VENTILATOR SETTINGS: FiO2 (%):  [28 %] 28 %  INTAKE / OUTPUT:  Intake/Output Summary (Last 24 hours) at 01/29/17 0820 Last data filed at 01/28/17 2105  Gross per 24 hour  Intake            252.5 ml  Output                0 ml  Net            252.5 ml    Name: Brittany Nguyen MRN: [id number] DOB: [birth year]    ADMISSION DATE:  01/28/2017   SUBJECTIVE:   Overnight patient's respiratory status has improved, has been weaned off of noninvasive ventilation presently on nasal cannula  Review of Systems:  Constitutional: Feels well. Cardiovascular: No chest pain.  Pulmonary: Denies dyspnea.   The remainder of systems were reviewed and were found to be negative other than what is documented in the HPI.   VITAL SIGNS: Temp:  [97.7 F (36.5 C)-99.1 F (37.3 C)] 97.7 F (36.5 C) (05/27 0750) Pulse Rate:  [81-102] 99 (05/27 0800) Resp:  [11-30] 16 (05/27 0800) BP: (82-153)/(54-120) 95/64 (05/27 0800) SpO2:  [94 %-100 %] 98 % (05/27 0800) FiO2 (%):  [28 %] 28 % (05/26 1403) Weight:  [105.7 kg (233 lb)-105.7 kg (233 lb 0.4 oz)] 105.7 kg (233 lb 0.4 oz) (05/26 1033)   PHYSICAL EXAMINATION: Physical Examination:   VS: BP 95/64   Pulse 99   Temp 97.7 F (36.5 C) (Oral)   Resp 16   Ht 5\' 5"  (1.651 m)   Wt 105.7 kg (233 lb 0.4 oz)   LMP 01/22/2012 (Approximate) Comment: Hysterectomy 5 years ago  SpO2 98%   BMI 38.78 kg/m   General Appearance: No distress  Neuro:without focal findings, mental  status normal. HEENT: PERRLA, EOM intact. Pulmonary: normal breath sounds   CardiovascularNormal S1,S2.  No m/r/g.   Abdomen: Benign, Soft, non-tender. Skin:   warm, no rashes, no ecchymosis  Extremities: normal, no cyanosis, clubbing.    LABORATORY PANEL:   CBC  Recent Labs Lab 01/29/17 0428  WBC 14.9*  HGB 12.1  HCT 36.6  PLT 236    Chemistries   Recent Labs Lab  01/29/17 0428  NA 137  K 4.0  CL 101  CO2 29  GLUCOSE 169*  BUN 25*  CREATININE 0.85  CALCIUM 8.7*  MG 2.2  PHOS 4.3    No results for input(s): GLUCAP in the last 168 hours.  Recent Labs Lab 01/28/17 0856  PHART 7.48*  PCO2ART 35  PO2ART 159*   No results for input(s): AST, ALT, ALKPHOS, BILITOT, ALBUMIN in the last 168 hours.  Cardiac Enzymes  Recent Labs Lab 01/29/17 0428  TROPONINI <0.03    RADIOLOGY:  Dg Chest Portable 1 View  Result Date: 01/28/2017 CLINICAL DATA:  Chest pain. EXAM: PORTABLE CHEST 1 VIEW COMPARISON:  Radiographs Jan 21, 2017. FINDINGS: Stable cardiomediastinal silhouette. No pneumothorax or pleural effusion is noted. Both lungs are clear. The visualized skeletal structures are unremarkable. IMPRESSION: No acute cardiopulmonary abnormality seen. Electronically Signed   By: Marijo Conception, M.D.   On: 01/28/2017 09:01   Hermelinda Dellen, DO 01/29/2017

## 2017-01-29 NOTE — Progress Notes (Signed)
12 beat run of  V-Tach while sleeping.  B/P stable.  Informed Dr. Elza Rafter d Dios.  Acknowledged. MD ordered stat labs.  Will continue to monitor.

## 2017-01-29 NOTE — Progress Notes (Signed)
RN made Dr. Jefferson Fuel aware that the patient has been having more frequent PVCs in the last hour or so, about 1 every 5 beats.  MD acknowledged and stated that the patient has a lower EF so that it is expected.  No new orders given and will continue with transfer to 2A.

## 2017-01-29 NOTE — Progress Notes (Signed)
eLink Physician-Brief Progress Note Patient Name: Brittany Nguyen DOB: 1966/07/06 MRN: [id number]   Date of Service  01/29/2017  HPI/Events of Note  Nurse calls about 12 beat V. tach while she was asleep.   Currently, comfortable, not in distress, vital signs stable. On nasal cannula.   Potassium was 4.  Patient known to have CHF with EF 25-30 percent, diffuse hypokinesis.   eICU Interventions  Will check magnesium and phosphorus now.  Trend troponin as ordered.      Intervention Category Major Interventions: Other:  Rush Landmark 01/29/2017, 6:43 AM

## 2017-01-29 NOTE — Progress Notes (Signed)
A&Ox4.  Denies pain.  No vomiting.  nausea improved with phenergan.  Tolerated lunch.  No shortness of breath.  Patient moved to room 245 by wheelchair with Thayer Headings, [place].

## 2017-01-29 NOTE — Progress Notes (Signed)
[place] [facility] [place] NAME: Brittany Nguyen    MR#:  [id number]  DATE OF BIRTH:  [birth year]  SUBJECTIVE:  CHIEF COMPLAINT:   Chief Complaint  Patient presents with  . Chest Pain   Better cough, shortness breath and wheezing. REVIEW OF SYSTEMS:  Review of Systems  Constitutional: Positive for malaise/fatigue. Negative for chills and fever.  HENT: Negative for sore throat.   Eyes: Negative for blurred vision and double vision.  Respiratory: Positive for cough and shortness of breath. Negative for hemoptysis, wheezing and stridor.   Cardiovascular: Negative for chest pain, palpitations and leg swelling.  Gastrointestinal: Negative for abdominal pain, blood in stool, constipation, diarrhea, melena, nausea and vomiting.  Genitourinary: Negative for dysuria, hematuria and urgency.  Musculoskeletal: Negative for back pain.  Neurological: Negative for dizziness, focal weakness, loss of consciousness, weakness and headaches.  Psychiatric/Behavioral: Negative for depression.    DRUG ALLERGIES:   Allergies  Allergen Reactions  . Contrast Media [Iodinated Diagnostic Agents] Shortness Of Breath  . Latex Hives  . Ondansetron Other (See Comments)    Severe headache  . Penicillins Hives and Other (See Comments)    Has patient had a PCN reaction causing immediate rash, facial/tongue/throat swelling, SOB or lightheadedness with hypotension: Yes Has patient had a PCN reaction causing severe rash involving mucus membranes or skin necrosis: No Has patient had a PCN reaction that required hospitalization No Has patient had a PCN reaction occurring within the last 10 years: No If all of the above answers are "NO", then may proceed with Cephalosporin use.   . Povidone-Iodine Rash  . Pulmicort [Budesonide] Itching   VITALS:  Blood pressure 99/76, pulse 98, temperature 97.7 F (36.5 C), temperature source Oral, resp. rate (!) 26, height 5\' 5"  (1.651 m),  weight 233 lb 0.4 oz (105.7 kg), last menstrual period 01/22/2012, SpO2 97 %. PHYSICAL EXAMINATION:  Physical Exam  Constitutional: She is oriented to person, place, and time.  Morbid obesity.  HENT:  Head: Normocephalic.  Mouth/Throat: Oropharynx is clear and moist.  Eyes: Conjunctivae and EOM are normal. No scleral icterus.  Neck: Normal range of motion. Neck supple. No JVD present. No tracheal deviation present.  Cardiovascular: Normal rate, regular rhythm and normal heart sounds.  Exam reveals no gallop.   No murmur heard. Pulmonary/Chest: Effort normal and breath sounds normal. No respiratory distress. She has no wheezes. She has no rales.  Diminished lung Sound.  Abdominal: Bowel sounds are normal. She exhibits no distension. There is no tenderness.  Musculoskeletal: Normal range of motion. She exhibits no edema or tenderness.  Neurological: She is alert and oriented to person, place, and time. No cranial nerve deficit.  Skin: No rash noted. No erythema.  Psychiatric: Affect normal.   LABORATORY PANEL:  Female CBC  Recent Labs Lab 01/29/17 0428  WBC 14.9*  HGB 12.1  HCT 36.6  PLT 236   ------------------------------------------------------------------------------------------------------------------ Chemistries   Recent Labs Lab 01/29/17 0428  NA 137  K 4.0  CL 101  CO2 29  GLUCOSE 169*  BUN 25*  CREATININE 0.85  CALCIUM 8.7*  MG 2.2   RADIOLOGY:  No results found. ASSESSMENT AND PLAN:   Acute respiratory failure with hypoxia due to asthma exacerbation. continue IV Solu-Medrol, DuoNeb every 6 hours,  levaquin for 7 days. Try to wean off O2 [place]. off BiPAP.  Hypokalemia. Given potassium supplement, improved.  Leukocytosis. Possible due to previous prednisone. Improving, follow-up CBC.  Acute on chronic  hronic systolic CHF. Ejection fraction 25%. Continue Lasix, Coreg and lisinopril, added spironolactone.  Morbid obesity and OSA. BIPAP at  night.  All the records are reviewed and case discussed with Care Management/Social Worker. Management plans discussed with the patient, family and they are in agreement.  CODE STATUS: Full Code  TOTAL TIME TAKING CARE OF THIS PATIENT: 36 minutes.   More than 50% of the time was spent in counseling/coordination of care: YES  POSSIBLE D/C IN 2 DAYS, DEPENDING ON CLINICAL CONDITION.   [name] M.D on 01/29/2017 at 12:48 PM  Between 7am to 6pm - Pager - [phone]  After 6pm go to [email or web address] - Proofreader  [facility] [place] [facility]  Office  [phone]  CC: Primary care physician; [name], MD  Note: This dictation was prepared with Dragon dictation along with smaller phrase technology. Any transcriptional errors that result from this process are unintentional.

## 2017-01-30 LAB — HIV ANTIBODY (ROUTINE TESTING W REFLEX): HIV SCREEN 4TH GENERATION: NONREACTIVE

## 2017-01-30 LAB — CBC
HCT: 38.9 % (ref 35.0–47.0)
Hemoglobin: 12.7 g/dL (ref 12.0–16.0)
MCH: 25.5 pg — ABNORMAL LOW (ref 26.0–34.0)
MCHC: 32.5 g/dL (ref 32.0–36.0)
MCV: 78.4 fL — AB (ref 80.0–100.0)
PLATELETS: 284 10*3/uL (ref 150–440)
RBC: 4.96 MIL/uL (ref 3.80–5.20)
RDW: 15.9 % — AB (ref 11.5–14.5)
WBC: 26.1 10*3/uL — AB (ref 3.6–11.0)

## 2017-01-30 LAB — OCCULT BLOOD X 1 CARD TO LAB, STOOL: FECAL OCCULT BLD: POSITIVE — AB

## 2017-01-30 MED ORDER — SPIRONOLACTONE 25 MG PO TABS
12.5000 mg | ORAL_TABLET | Freq: Every day | ORAL | Status: DC
  Administered 2017-01-30: 12.5 mg via ORAL
  Filled 2017-01-30: qty 1

## 2017-01-30 MED ORDER — LEVOFLOXACIN 500 MG PO TABS: 500.0000 mg | ORAL_TABLET | Freq: Every day | ORAL | 0 refills | Status: DC

## 2017-01-30 MED ORDER — PREDNISONE 10 MG PO TABS: ORAL_TABLET | ORAL | 0 refills | Status: DC

## 2017-01-30 MED ORDER — PREDNISONE 50 MG PO TABS
50.0000 mg | ORAL_TABLET | Freq: Every day | ORAL | Status: DC
  Administered 2017-01-30: 50 mg via ORAL
  Filled 2017-01-30: qty 1

## 2017-01-30 MED ORDER — SPIRONOLACTONE 25 MG PO TABS: 12.5000 mg | ORAL_TABLET | Freq: Every day | ORAL | 0 refills | Status: DC

## 2017-01-30 MED ORDER — LEVOFLOXACIN 500 MG PO TABS
500.0000 mg | ORAL_TABLET | Freq: Every day | ORAL | Status: DC
  Administered 2017-01-30: 500 mg via ORAL
  Filled 2017-01-30: qty 1

## 2017-01-30 MED ORDER — GUAIFENESIN 100 MG/5ML PO SOLN: 5.0000 mL | ORAL | 0 refills | Status: DC | PRN

## 2017-01-30 MED ORDER — DIPHENHYDRAMINE HCL 25 MG PO CAPS
25.0000 mg | ORAL_CAPSULE | Freq: Three times a day (TID) | ORAL | Status: DC | PRN
  Administered 2017-01-30 (×2): 25 mg via ORAL
  Filled 2017-01-30 (×2): qty 1

## 2017-01-30 NOTE — Progress Notes (Signed)
Pt complaining of itching and some redness to skin, Dr. Estanislado Pandy paged, he gave orders to give benadryl 25mg  q8hr for itching. Will give and continue to monitor. Conley Simmonds, RN, BSN

## 2017-01-30 NOTE — Care Management (Signed)
Have not received confirmation one way or the other whether [facility] is open.  Faxed patients scripts for aldactone, prednisone and levaquin to the clinic.  Patient will not require dose of any of this until tomorrow

## 2017-01-30 NOTE — Discharge Instructions (Signed)
Heart healthy diet

## 2017-01-30 NOTE — Progress Notes (Signed)
MD made aware of + stool occult result/ ok to discharge home/ discharge instructions explained to pt and pts spouse/ verbalized an understanding/ iv and tele removed/ RX given to pt/ will transport off unit via wheelchair.

## 2017-01-30 NOTE — Plan of Care (Signed)
Problem: Pain Managment: Goal: General experience of comfort will improve Outcome: Not Progressing Pt with complaints of abdominal pain last night treated with '1mg'$  of morphine twice which did give relief. Will continue to monitor.  Problem: Tissue Perfusion: Goal: Risk factors for ineffective tissue perfusion will decrease Outcome: Progressing lovenox for VTE.  Problem: Activity: Goal: Risk for activity intolerance will decrease Outcome: Completed/Met Date Met: 01/30/17 Up out of bed independently tolerating well. Will continue to monitor.  Problem: Bowel/Gastric: Goal: Will not experience complications related to bowel motility Outcome: Progressing BM this shift.

## 2017-01-30 NOTE — Discharge Summary (Signed)
[place] [place] NAME: Brittany Nguyen    MR#:  [id number]  DATE OF BIRTH:  Sep 04, 1966  DATE OF ADMISSION:  01/28/2017   ADMITTING PHYSICIAN: [name], MD  DATE OF DISCHARGE: 01/30/2017 PRIMARY CARE PHYSICIAN: [name], MD   ADMISSION DIAGNOSIS:  Respiratory distress [R06.03] DISCHARGE DIAGNOSIS:  Active Problems:   Acute respiratory failure (HCC) Acute respiratory failure with hypoxia due to asthma exacerbation. Acute on chronic hronic systolic CHF. Ejection fraction 25%. SECONDARY DIAGNOSIS:   Past Medical History:  Diagnosis Date  . Asthma 2013  . CHF (congestive heart failure) ([place])   . Diverticulitis 2010  . DVT (deep venous thrombosis) ([place])   . Endometriosis 1990  . Heart disease 2013  . Hx MRSA infection   . Lump or mass in breast   . Restless leg    HOSPITAL COURSE:   Acute respiratory failure with hypoxia due to asthma exacerbation. She has been treated with  IV Solu-Medrol, DuoNeb every 6 hours,  levaquin for 7 days total per intensivist. Off O2 . Change to prednisone taper.  Hypokalemia. Given potassium supplement, improved.  Leukocytosis. Possible due to previous prednisone.  follow-up CBC as outpatient.  Acute on chronic hronic systolic CHF. Ejection fraction 25%. Continue Lasix, Coreg and lisinopril, addedspironolactone.  Morbid obesity and OSA. BIPAP at night.`  DISCHARGE CONDITIONS:  Stable, discharge to home today. CONSULTS OBTAINED:   DRUG ALLERGIES:   Allergies  Allergen Reactions  . Contrast Media [Iodinated Diagnostic Agents] Shortness Of Breath  . Latex Hives  . Ondansetron Other (See Comments)    Severe headache  . Penicillins Hives and Other (See Comments)    Has patient had a PCN reaction causing immediate rash, facial/tongue/throat swelling, SOB or lightheadedness with hypotension: Yes Has patient had a PCN reaction causing severe rash involving mucus membranes or skin  necrosis: No Has patient had a PCN reaction that required hospitalization No Has patient had a PCN reaction occurring within the last 10 years: No If all of the above answers are "NO", then may proceed with Cephalosporin use.   . Povidone-Iodine Rash  . Pulmicort [Budesonide] Itching   DISCHARGE MEDICATIONS:   Allergies as of 01/30/2017      Reactions   Contrast Media [iodinated Diagnostic Agents] Shortness Of Breath   Latex Hives   Ondansetron Other (See Comments)   Severe headache   Penicillins Hives, Other (See Comments)   Has patient had a PCN reaction causing immediate rash, facial/tongue/throat swelling, SOB or lightheadedness with hypotension: Yes Has patient had a PCN reaction causing severe rash involving mucus membranes or skin necrosis: No Has patient had a PCN reaction that required hospitalization No Has patient had a PCN reaction occurring within the last 10 years: No If all of the above answers are "NO", then may proceed with Cephalosporin use.   Povidone-iodine Rash   Pulmicort [budesonide] Itching      Medication List    TAKE these medications   albuterol 108 (90 Base) MCG/ACT inhaler Commonly known as:  PROVENTIL HFA;VENTOLIN HFA Inhale 2 puffs into the lungs every 6 (six) hours as needed for wheezing or shortness of breath.   aspirin 81 MG chewable tablet Chew 1 tablet (81 mg total) by mouth daily.   carvedilol 3.125 MG tablet Commonly known as:  COREG Take 1 tablet (3.125 mg total) by mouth 2 (two) times daily with a meal.   Fluticasone-Salmeterol 500-50 MCG/DOSE Aepb Commonly known as:  ADVAIR DISKUS  Inhale 1 puff into the lungs 2 (two) times daily.   furosemide 40 MG tablet Commonly known as:  LASIX Take 1 tablet (40 mg total) by mouth daily.   guaiFENesin 100 MG/5ML Soln Commonly known as:  ROBITUSSIN Take 5 mLs (100 mg total) by mouth every 4 (four) hours as needed for cough or to loosen phlegm.   levofloxacin 500 MG tablet Commonly known  as:  LEVAQUIN Take 1 tablet (500 mg total) by mouth daily. Start taking on:  01/31/2017   lisinopril 5 MG tablet Commonly known as:  PRINIVIL,ZESTRIL Take 1 tablet (5 mg total) by mouth daily.   nitroGLYCERIN 0.4 MG SL tablet Commonly known as:  NITROSTAT Place 1 tablet (0.4 mg total) under the tongue every 5 (five) minutes as needed for chest pain.   predniSONE 10 MG tablet Commonly known as:  DELTASONE 40 mg po daily for 2 days, 20 mg po daily for 2 days, 10 mg po daily for 2 days. Start taking on:  01/31/2017 What changed:  additional instructions   rOPINIRole 2 MG tablet Commonly known as:  REQUIP Take 1 tablet (2 mg total) by mouth at bedtime. What changed:  how much to take   spironolactone 25 MG tablet Commonly known as:  ALDACTONE Take 0.5 tablets (12.5 mg total) by mouth daily. Start taking on:  01/31/2017        DISCHARGE INSTRUCTIONS:  See AVS.  If you experience worsening of your admission symptoms, develop shortness of breath, life threatening emergency, suicidal or homicidal thoughts you must seek medical attention immediately by calling 911 or calling your MD immediately  if symptoms less severe.  You Must read complete instructions/literature along with all the possible adverse reactions/side effects for all the Medicines you take and that have been prescribed to you. Take any new Medicines after you have completely understood and accpet all the possible adverse reactions/side effects.   Please note  You were cared for by a hospitalist during your hospital stay. If you have any questions about your discharge medications or the care you received while you were in the hospital after you are discharged, you can call the unit and asked to speak with the hospitalist on call if the hospitalist that took care of you is not available. Once you are discharged, your primary care physician will handle any further medical issues. Please note that NO REFILLS for any discharge  medications will be authorized once you are discharged, as it is imperative that you return to your primary care physician (or establish a relationship with a primary care physician if you do not have one) for your aftercare needs so that they can reassess your need for medications and monitor your lab values.    On the day of Discharge:  VITAL SIGNS:  Blood pressure 106/62, pulse 95, temperature 98 F (36.7 C), temperature source Oral, resp. rate 18, height 5\' 5"  (1.651 m), weight 233 lb 0.4 oz (105.7 kg), last menstrual period 01/22/2012, SpO2 97 %. PHYSICAL EXAMINATION:  GENERAL:  51 y.o.-year-old patient lying in the bed with no acute distress. Obese. EYES: Pupils equal, round, reactive to light and accommodation. No scleral icterus. Extraocular muscles intact.  HEENT: Head atraumatic, normocephalic. Oropharynx and nasopharynx clear.  NECK:  Supple, no jugular venous distention. No thyroid enlargement, no tenderness.  LUNGS: Normal breath sounds bilaterally, no wheezing, rales,rhonchi or crepitation. No use of accessory muscles of respiration.  CARDIOVASCULAR: S1, S2 normal. No murmurs, rubs, or gallops.  ABDOMEN: Soft,  non-tender, non-distended. Bowel sounds present. No organomegaly or mass.  EXTREMITIES: No pedal edema, cyanosis, or clubbing.  NEUROLOGIC: Cranial nerves II through XII are intact. Muscle strength 5/5 in all extremities. Sensation intact. Gait not checked.  PSYCHIATRIC: The patient is alert and oriented x 3.  SKIN: No obvious rash, lesion, or ulcer.  DATA REVIEW:   CBC  Recent Labs Lab 01/30/17 0954  WBC 26.1*  HGB 12.7  HCT 38.9  PLT 284    Chemistries   Recent Labs Lab 01/29/17 0428  NA 137  K 4.0  CL 101  CO2 29  GLUCOSE 169*  BUN 25*  CREATININE 0.85  CALCIUM 8.7*  MG 2.2     Microbiology Results  Results for orders placed or performed during the hospital encounter of 01/28/17  MRSA PCR Screening     Status: None   Collection Time:  01/28/17 10:34 AM  Result Value Ref Range Status   MRSA by PCR NEGATIVE NEGATIVE Final    Comment:        The GeneXpert MRSA Assay (FDA approved for NASAL specimens only), is one component of a comprehensive MRSA colonization surveillance program. It is not intended to diagnose MRSA infection nor to guide or monitor treatment for MRSA infections.     RADIOLOGY:  No results found.   Management plans discussed with the patient, family and they are in agreement.  CODE STATUS: Full Code   TOTAL TIME TAKING CARE OF THIS PATIENT: 35 minutes.    [name] M.D on 01/30/2017 at 11:49 AM  Between 7am to 6pm - Pager - [phone]  After 6pm go to [email or web address] - Proofreader  [facility]  Office  [phone]  CC: Primary care physician; [name], MD   Note: This dictation was prepared with Dragon dictation along with smaller phrase technology. Any transcriptional errors that result from this process are unintentional.

## 2017-02-01 ENCOUNTER — Ambulatory Visit: Payer: Self-pay | Admitting: Family

## 2017-02-03 ENCOUNTER — Encounter: Payer: Self-pay | Admitting: Family

## 2017-02-03 ENCOUNTER — Ambulatory Visit: Payer: Self-pay | Attending: Family | Admitting: Family

## 2017-02-03 VITALS — BP 87/55 | HR 100 | Resp 20 | Ht 65.0 in | Wt 231.5 lb

## 2017-02-03 DIAGNOSIS — Z888 Allergy status to other drugs, medicaments and biological substances status: Secondary | ICD-10-CM | POA: Insufficient documentation

## 2017-02-03 DIAGNOSIS — Z91041 Radiographic dye allergy status: Secondary | ICD-10-CM | POA: Insufficient documentation

## 2017-02-03 DIAGNOSIS — I959 Hypotension, unspecified: Secondary | ICD-10-CM | POA: Insufficient documentation

## 2017-02-03 DIAGNOSIS — R053 Chronic cough: Secondary | ICD-10-CM

## 2017-02-03 DIAGNOSIS — Z7982 Long term (current) use of aspirin: Secondary | ICD-10-CM | POA: Insufficient documentation

## 2017-02-03 DIAGNOSIS — J45901 Unspecified asthma with (acute) exacerbation: Secondary | ICD-10-CM | POA: Insufficient documentation

## 2017-02-03 DIAGNOSIS — R42 Dizziness and giddiness: Secondary | ICD-10-CM | POA: Insufficient documentation

## 2017-02-03 DIAGNOSIS — Z86718 Personal history of other venous thrombosis and embolism: Secondary | ICD-10-CM | POA: Insufficient documentation

## 2017-02-03 DIAGNOSIS — I5022 Chronic systolic (congestive) heart failure: Secondary | ICD-10-CM | POA: Insufficient documentation

## 2017-02-03 DIAGNOSIS — Z8041 Family history of malignant neoplasm of ovary: Secondary | ICD-10-CM | POA: Insufficient documentation

## 2017-02-03 DIAGNOSIS — Z9049 Acquired absence of other specified parts of digestive tract: Secondary | ICD-10-CM | POA: Insufficient documentation

## 2017-02-03 DIAGNOSIS — Z803 Family history of malignant neoplasm of breast: Secondary | ICD-10-CM | POA: Insufficient documentation

## 2017-02-03 DIAGNOSIS — Z8614 Personal history of Methicillin resistant Staphylococcus aureus infection: Secondary | ICD-10-CM | POA: Insufficient documentation

## 2017-02-03 DIAGNOSIS — I952 Hypotension due to drugs: Secondary | ICD-10-CM

## 2017-02-03 DIAGNOSIS — N63 Unspecified lump in unspecified breast: Secondary | ICD-10-CM | POA: Insufficient documentation

## 2017-02-03 DIAGNOSIS — G2581 Restless legs syndrome: Secondary | ICD-10-CM | POA: Insufficient documentation

## 2017-02-03 DIAGNOSIS — Z880 Allergy status to penicillin: Secondary | ICD-10-CM | POA: Insufficient documentation

## 2017-02-03 DIAGNOSIS — R05 Cough: Secondary | ICD-10-CM

## 2017-02-03 DIAGNOSIS — Z91040 Latex allergy status: Secondary | ICD-10-CM | POA: Insufficient documentation

## 2017-02-03 DIAGNOSIS — Z90710 Acquired absence of both cervix and uterus: Secondary | ICD-10-CM | POA: Insufficient documentation

## 2017-02-03 DIAGNOSIS — Z808 Family history of malignant neoplasm of other organs or systems: Secondary | ICD-10-CM | POA: Insufficient documentation

## 2017-02-03 DIAGNOSIS — I5042 Chronic combined systolic (congestive) and diastolic (congestive) heart failure: Secondary | ICD-10-CM | POA: Insufficient documentation

## 2017-02-03 NOTE — Patient Instructions (Addendum)
Continue weighing daily and call for an overnight weight gain of > 2 pounds or a weekly weight gain of >5 pounds.  Decrease furosemide to 20mg  daily.

## 2017-02-03 NOTE — Progress Notes (Signed)
Patient ID: Brittany Nguyen, female    DOB: July 22, 1966, 51 y.o.   MRN: [id number]  HPI  [name] is a 51 y/o female with a history of asthma, DVT, restless leg syndrome, MRSA, heart disease and chronic heart failure.   Reviewed last echo report done on 12/09/16 which showed an EF of 25-30% without any valvular regurgitation. EF has decreased from March 2017 when it was 55-60%.  Admitted 01/28/17 with acute respiratory failure due to asthma exacerbation. Was treated with IV solu-medrol, antibiotics and then prednisone taper. Discharged home after 2 days. Admitted 01/21/17 due to asthma exacerbation along with mild HF. Given IV steroids, nebulizers and oral diuretics. Discharged home after 3 days. Admitted 12/08/16 with HF exacerbation. Cardiology consult obtained. Discharged home the next day.  She presents today for her initial visit with a chief complaint of moderate shortness of breath with minimal activity. She says that this has been present for a few years but has been getting progressively worse over the last few months. She has associated fatigue, persistent dry cough, wheezing and dizziness associated along with this.   Past Medical History:  Diagnosis Date  . Asthma 2013  . CHF (congestive heart failure) ([place])   . Diverticulitis 2010  . DVT (deep venous thrombosis) ([place])   . Endometriosis 1990  . Heart disease 2013  . Hx MRSA infection   . Lump or mass in breast   . Restless leg    Past Surgical History:  Procedure Laterality Date  . ABDOMINAL HYSTERECTOMY     age 64  . CESAREAN SECTION    . CHOLECYSTECTOMY    . COLECTOMY    . NASAL SINUS SURGERY  2012   Family History  Problem Relation Age of Onset  . Cancer Mother 30       ovarian  . Cancer Father 51       brain  . Cancer Daughter 18       skin  . Cancer Maternal Aunt 19       breast   Social History  Substance Use Topics  . Smoking status: Never Smoker  . Smokeless tobacco: Never Used  . Alcohol use No    Allergies  Allergen Reactions  . Contrast Media [Iodinated Diagnostic Agents] Shortness Of Breath  . Latex Hives  . Ondansetron Other (See Comments)    Severe headache  . Penicillins Hives and Other (See Comments)    Has patient had a PCN reaction causing immediate rash, facial/tongue/throat swelling, SOB or lightheadedness with hypotension: Yes Has patient had a PCN reaction causing severe rash involving mucus membranes or skin necrosis: No Has patient had a PCN reaction that required hospitalization No Has patient had a PCN reaction occurring within the last 10 years: No If all of the above answers are "NO", then may proceed with Cephalosporin use.   . Povidone-Iodine Rash  . Pulmicort [Budesonide] Itching   Prior to Admission medications   Medication Sig Start Date End Date Taking? Authorizing Provider  albuterol (PROVENTIL HFA;VENTOLIN HFA) 108 (90 Base) MCG/ACT inhaler Inhale 2 puffs into the lungs every 6 (six) hours as needed for wheezing or shortness of breath. 06/06/16  Yes Brittany Nguyen, [place], Brittany Nguyen  aspirin 81 MG chewable tablet Chew 1 tablet (81 mg total) by mouth daily. 12/09/16  Yes Brittany Loll, Brittany Nguyen  beclomethasone (QVAR) 40 MCG/ACT inhaler Inhale 2 puffs into the lungs 2 (two) times daily.   Yes Provider, Historical, Brittany Nguyen  carvedilol (COREG) 3.125 MG tablet Take  1 tablet (3.125 mg total) by mouth 2 (two) times daily with a meal. 01/24/17  Yes Brittany Nguyen, Brittany Heman, Brittany Nguyen  Fluticasone-Salmeterol (ADVAIR DISKUS) 500-50 MCG/DOSE AEPB Inhale 1 puff into the lungs 2 (two) times daily. 01/24/17 01/24/18 Yes Brittany Nguyen, Brittany Heman, Brittany Nguyen  furosemide (LASIX) 40 MG tablet Take 1 tablet (40 mg total) by mouth daily. 01/25/17  Yes Brittany Leber, Brittany Nguyen  guaiFENesin (ROBITUSSIN) 100 MG/5ML SOLN Take 5 mLs (100 mg total) by mouth every 4 (four) hours as needed for cough or to loosen phlegm. 01/30/17  Yes Brittany Loll, Brittany Nguyen  levofloxacin (LEVAQUIN) 500 MG tablet Take 1 tablet (500 mg total) by mouth daily. 01/31/17  Yes  Brittany Loll, Brittany Nguyen  lisinopril (PRINIVIL,ZESTRIL) 5 MG tablet Take 1 tablet (5 mg total) by mouth daily. 01/25/17  Yes Brittany Nguyen, Brittany Heman, Brittany Nguyen  nitroGLYCERIN (NITROSTAT) 0.4 MG SL tablet Place 1 tablet (0.4 mg total) under the tongue every 5 (five) minutes as needed for chest pain. 01/24/17  Yes Brittany Nguyen, Brittany Heman, Brittany Nguyen  predniSONE (DELTASONE) 10 MG tablet 40 mg po daily for 2 days, 20 mg po daily for 2 days, 10 mg po daily for 2 days. 01/31/17  Yes Brittany Loll, Brittany Nguyen  rOPINIRole (REQUIP) 2 MG tablet Take 1 tablet (2 mg total) by mouth at bedtime. Patient taking differently: Take 4 mg by mouth at bedtime.  05/14/15  Yes Brittany Nguyen, Brittany Nguyen  spironolactone (ALDACTONE) 25 MG tablet Take 0.5 tablets (12.5 mg total) by mouth daily. 01/31/17  Yes Brittany Loll, Brittany Nguyen    Review of Systems  Constitutional: Positive for fatigue. Negative for appetite change.  HENT: Negative for congestion, postnasal drip and sore throat.   Eyes: Negative.   Respiratory: Positive for cough (dry cough worse at night), chest tightness (last night), shortness of breath and wheezing.   Cardiovascular: Negative for chest pain, palpitations and leg swelling.  Gastrointestinal: Positive for nausea (last 2-3 weeks). Negative for abdominal distention and abdominal pain.  Endocrine: Negative.   Genitourinary: Negative.   Musculoskeletal: Positive for neck pain. Negative for back pain.  Skin: Negative.   Allergic/Immunologic: Negative.   Neurological: Positive for dizziness and light-headedness (especially when standing up too quickly).  Hematological: Negative for adenopathy. Does not bruise/bleed easily.  Psychiatric/Behavioral: Positive for sleep disturbance (wearing CPAP; difficulty staying asleep). Negative for dysphoric mood. The patient is not nervous/anxious.    Vitals:   02/03/17 0928  BP: (!) 87/55  Pulse: 100  Resp: 20  SpO2: 96%  Weight: 231 lb 8 oz (105 kg)  Height: 5\' 5"  (1.651 m)   Wt Readings from Last 3 Encounters:   02/03/17 231 lb 8 oz (105 kg)  01/28/17 233 lb 0.4 oz (105.7 kg)  01/24/17 233 lb 6.4 oz (105.9 kg)   Lab Results  Component Value Date   CREATININE 0.85 01/29/2017   CREATININE 0.99 01/28/2017   CREATININE 0.90 01/23/2017   Physical Exam  Constitutional: She is oriented to person, place, and time. She appears well-developed and well-nourished.  HENT:  Head: Normocephalic and atraumatic.  Neck: Normal range of motion. Neck supple. No JVD present.  Cardiovascular: Regular rhythm.  Tachycardia present.   Pulmonary/Chest: Effort normal. She has no wheezes. She has no rales.  Abdominal: Soft. She exhibits no distension. There is no tenderness.  Musculoskeletal: She exhibits no edema or tenderness.  Neurological: She is alert and oriented to person, place, and time.  Skin: Skin is warm and dry.  Psychiatric: She has a normal mood and  affect. Her behavior is normal. Thought content normal.  Nursing note and vitals reviewed.  Assessment & Plan:  1: Chronic heart failure with reduced ejection fraction- - NYHA class III - euvolemic - weighing daily and says that her weight has been stable; instructed to call for an overnight weight gain of >2 pounds or a weekly weight gain of >5 pounds - not adding salt and is trying to eat low sodium foods. Discussed the importance of closely following a 2000mg  sodium diet.  - unable to change lisinopril to entresto right now due to hypotension - sees cardiologist Brittany Nguyen) today  2: Hypotension- - experiencing dizziness especially with position changes. - since no signs of fluid overload, will decrease her furosemide to 20mg  daily. May be able to stop completely and use PRN - sees PCP Brittany Nguyen) today  3: Cough, persistent- - has seen pulmonologist Brittany Nguyen) in the past; may need an appointment due to frequent asthma flares - question whether cough related to lisinopril although patient thinks she's been coughing longer than she's been taking the  medication  Medication bottles were reviewed.  Return in 2 weeks or sooner for any questions/problems before then.

## 2017-02-05 IMAGING — NM NM PULMONARY VENT & PERF
2 series · 15 of 15 positions shown · non-contrast
Comparison: Chest x-ray from earlier today

CLINICAL DATA: Shortness of breath

EXAM:
NUCLEAR MEDICINE VENTILATION - PERFUSION LUNG SCAN
TECHNIQUE: Ventilation images were obtained in multiple projections using
inhaled aerosol 7c-55m DTPA. Perfusion images were obtained in
multiple projections after intravenous injection of 7c-55m MAA.
RADIOPHARMACEUTICALS:  31.9 7echnetium-EEm DTPA aerosol inhalation
and 3.9 7echnetium-EEm MAA IV

[Series 1000: lung perfusion · 1.95mm/px · 4 acquisitions, 8 frames shown]
[im 1/4]
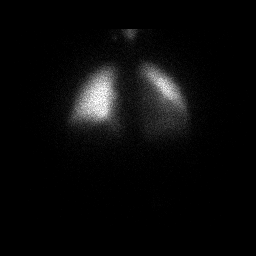
[im 1/4]
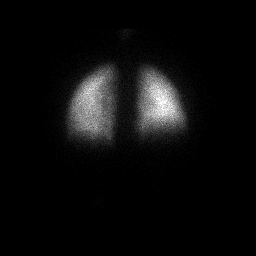
[im 2/4]
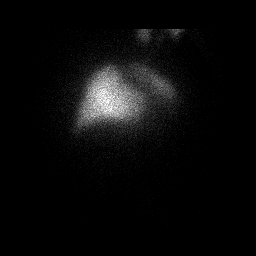
[im 2/4]
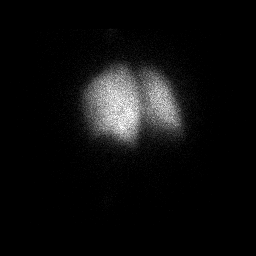
[im 3/4]
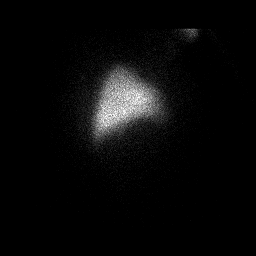
[im 3/4]
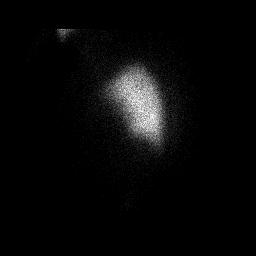
[im 4/4]
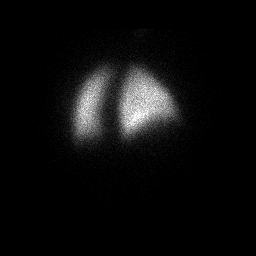
[im 4/4]
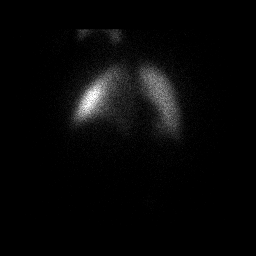

[Series 1000: lung ventilation · 3.90mm/px · 4 acquisitions, 7 frames shown]
[im 1/4]
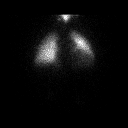
[im 2/4]
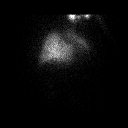
[im 2/4]
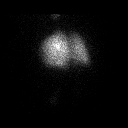
[im 3/4]
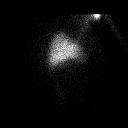
[im 3/4]
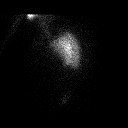
[im 4/4]
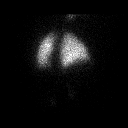
[im 4/4]
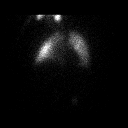

[15 of 15 positions shown; findings below may reference images not displayed]

FINDINGS: Ventilation: No focal ventilation defect.

Perfusion: No wedge shaped peripheral perfusion defects to suggest
acute pulmonary embolism.
IMPRESSION: No evidence for pulmonary embolus.  Normal V/Q scan.

## 2017-02-05 IMAGING — DX DG CHEST 1V PORT
1 series · 2 of 2 positions shown · non-contrast
Comparison: Chest radiograph performed 09/19/2015

CLINICAL DATA: Acute onset of shortness of breath with exertion and
cough. Initial encounter.

EXAM:
PORTABLE CHEST 1 VIEW

[Series 1: chest ap · 0.14mm/px · 2 of 2 slices shown]
[im 1/2]
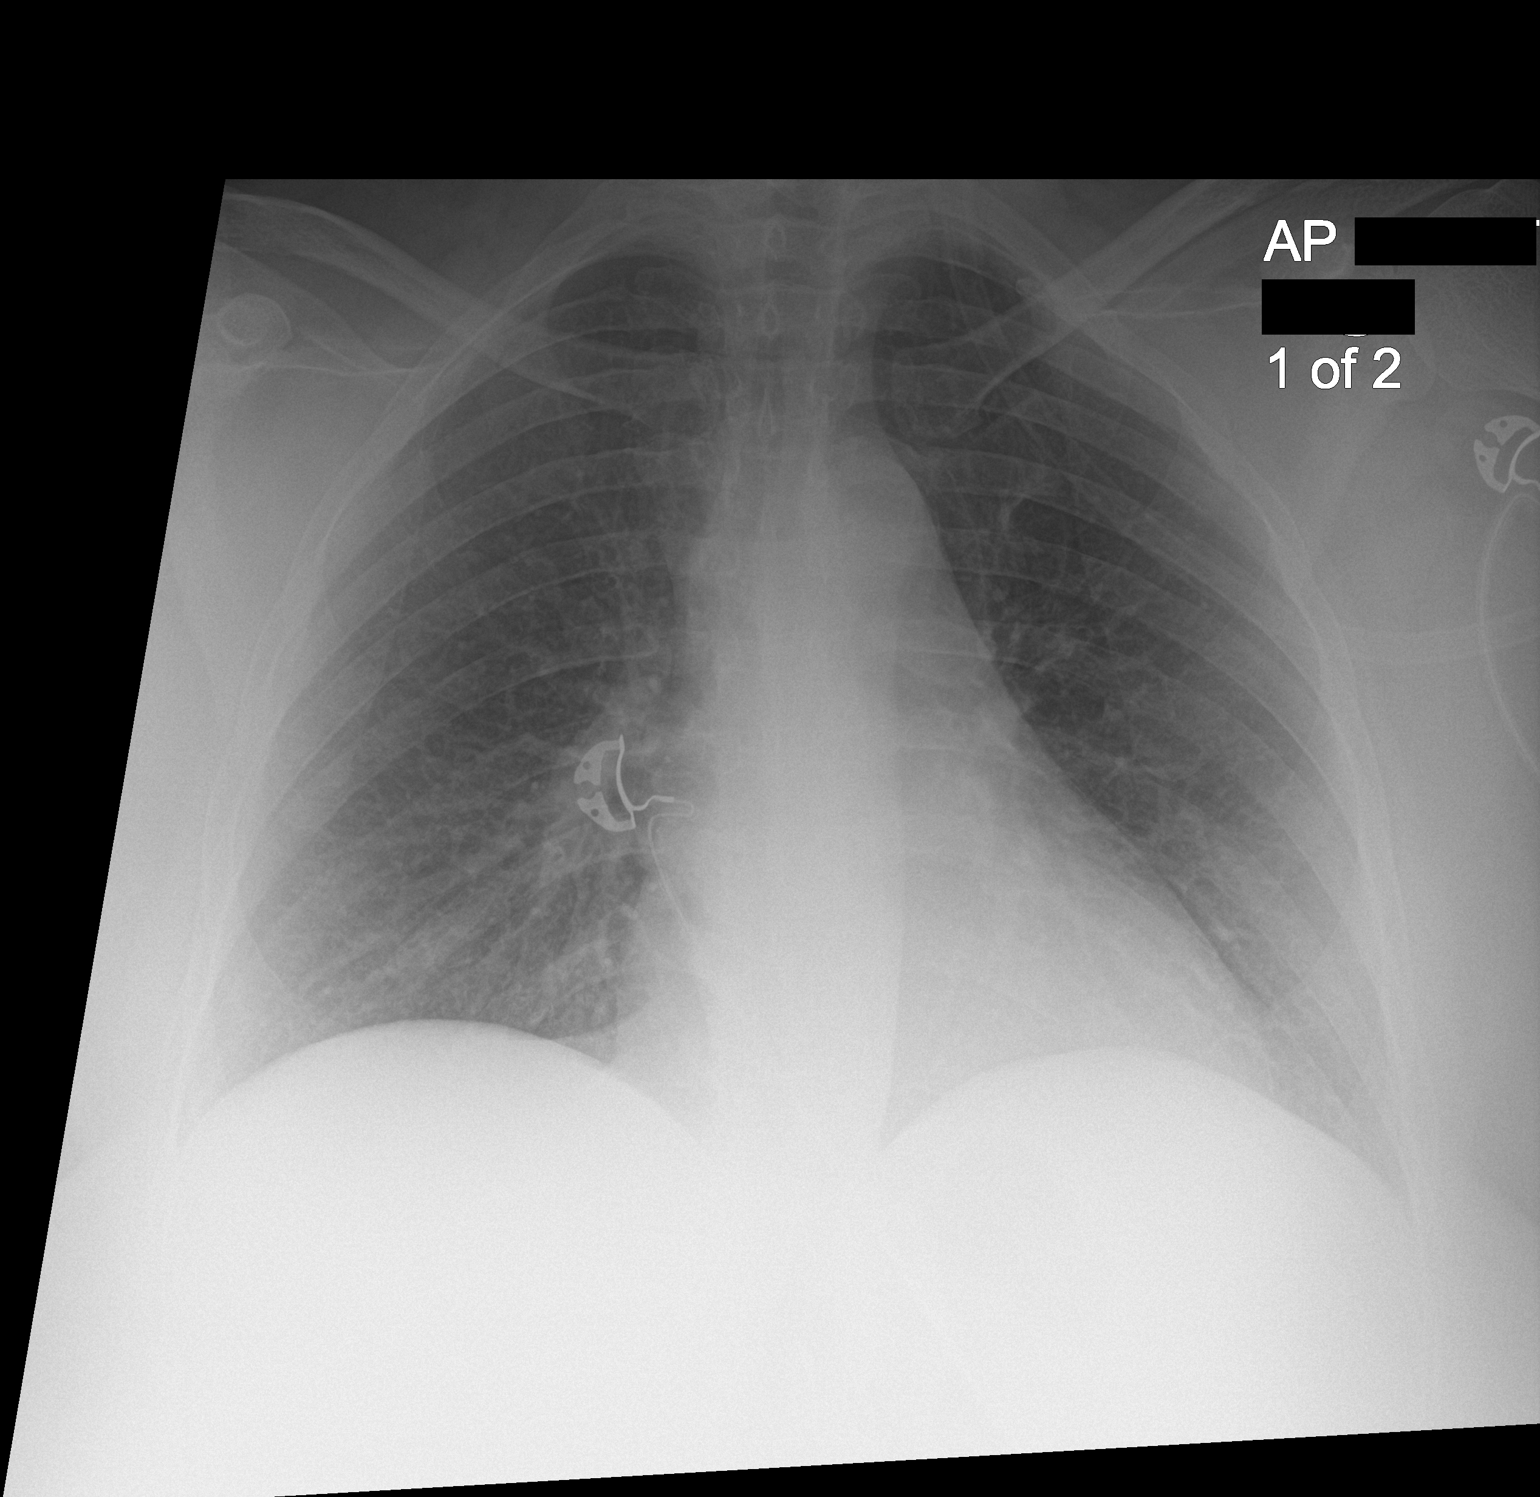
[im 2/2]
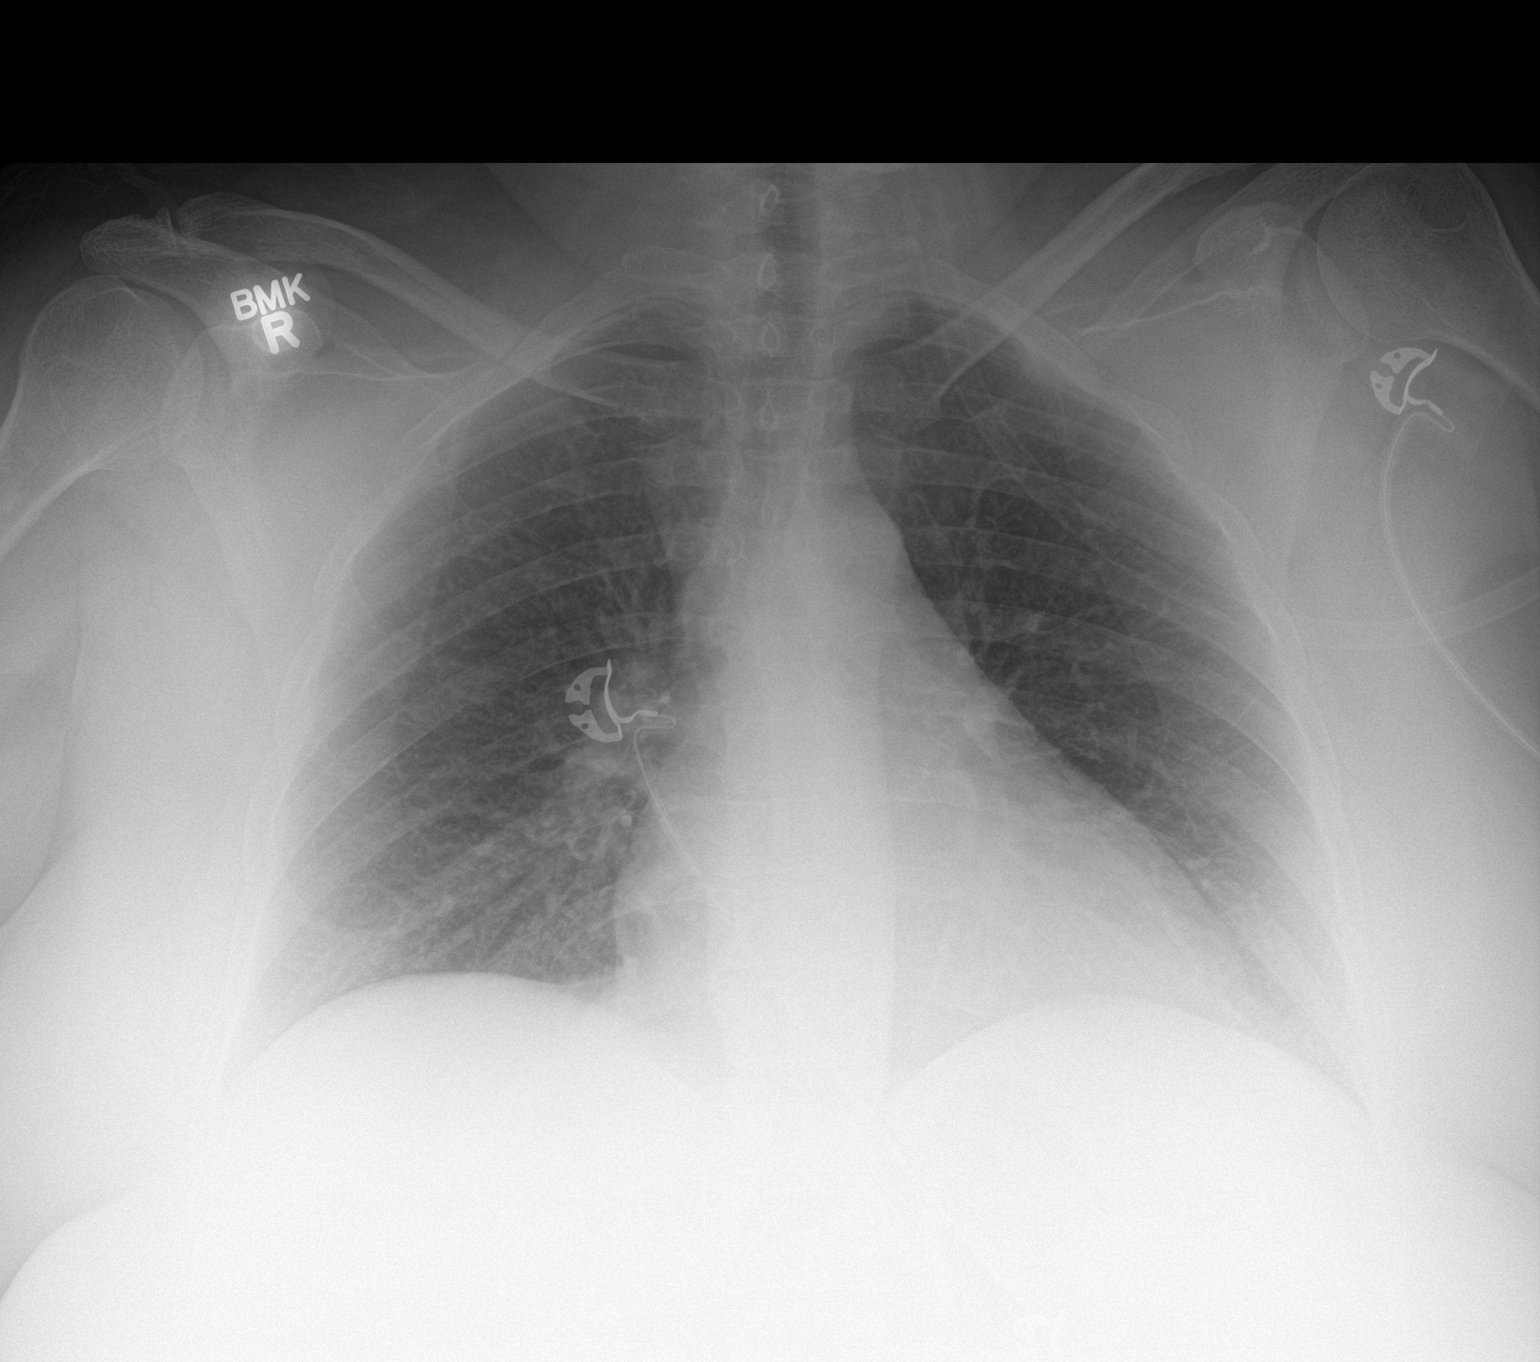

[2 of 2 positions shown; findings below may reference images not displayed]

FINDINGS: The lungs are well-aerated and clear. There is no evidence of focal
opacification, pleural effusion or pneumothorax.

The cardiomediastinal silhouette is within normal limits. No acute
osseous abnormalities are seen.
IMPRESSION: No acute cardiopulmonary process seen.

## 2017-02-16 ENCOUNTER — Ambulatory Visit: Payer: Self-pay | Admitting: Family

## 2017-02-16 ENCOUNTER — Telehealth: Payer: Self-pay | Admitting: Family

## 2017-02-16 NOTE — Telephone Encounter (Signed)
Patient did not show for her [facility] appointment on 02/16/17. Will attempt to reschedule.

## 2017-02-22 IMAGING — CR DG CHEST 2V
1 series · 2 of 2 positions shown · non-contrast
Comparison: 11/21/2015

CLINICAL DATA: Nonproductive cough, fever and shortness of breath
for 2 days.

EXAM:
CHEST  2 VIEW

[Series 1: w chest pa · 0.14mm/px · 2 of 2 slices shown]
[im 1/2]
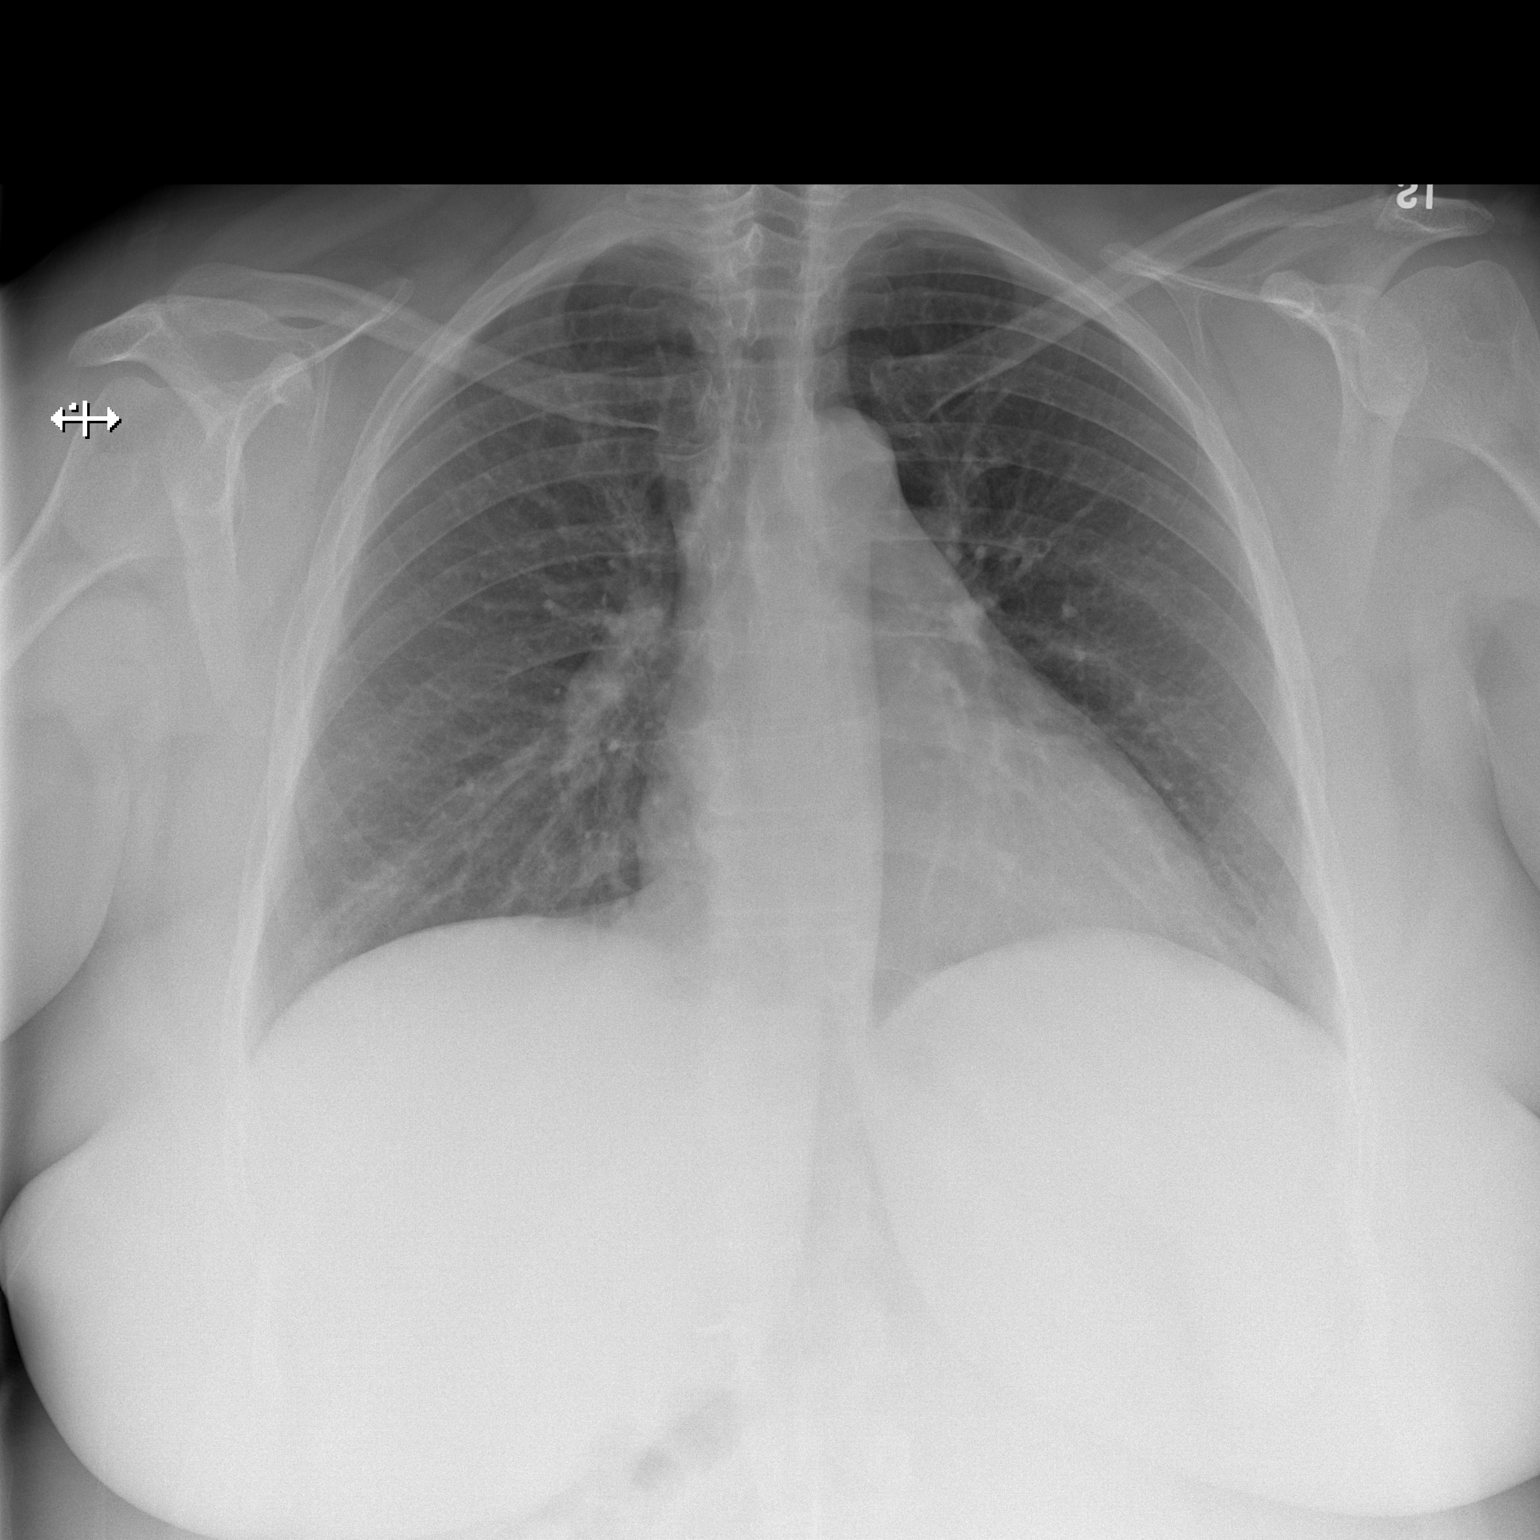
[im 2/2]
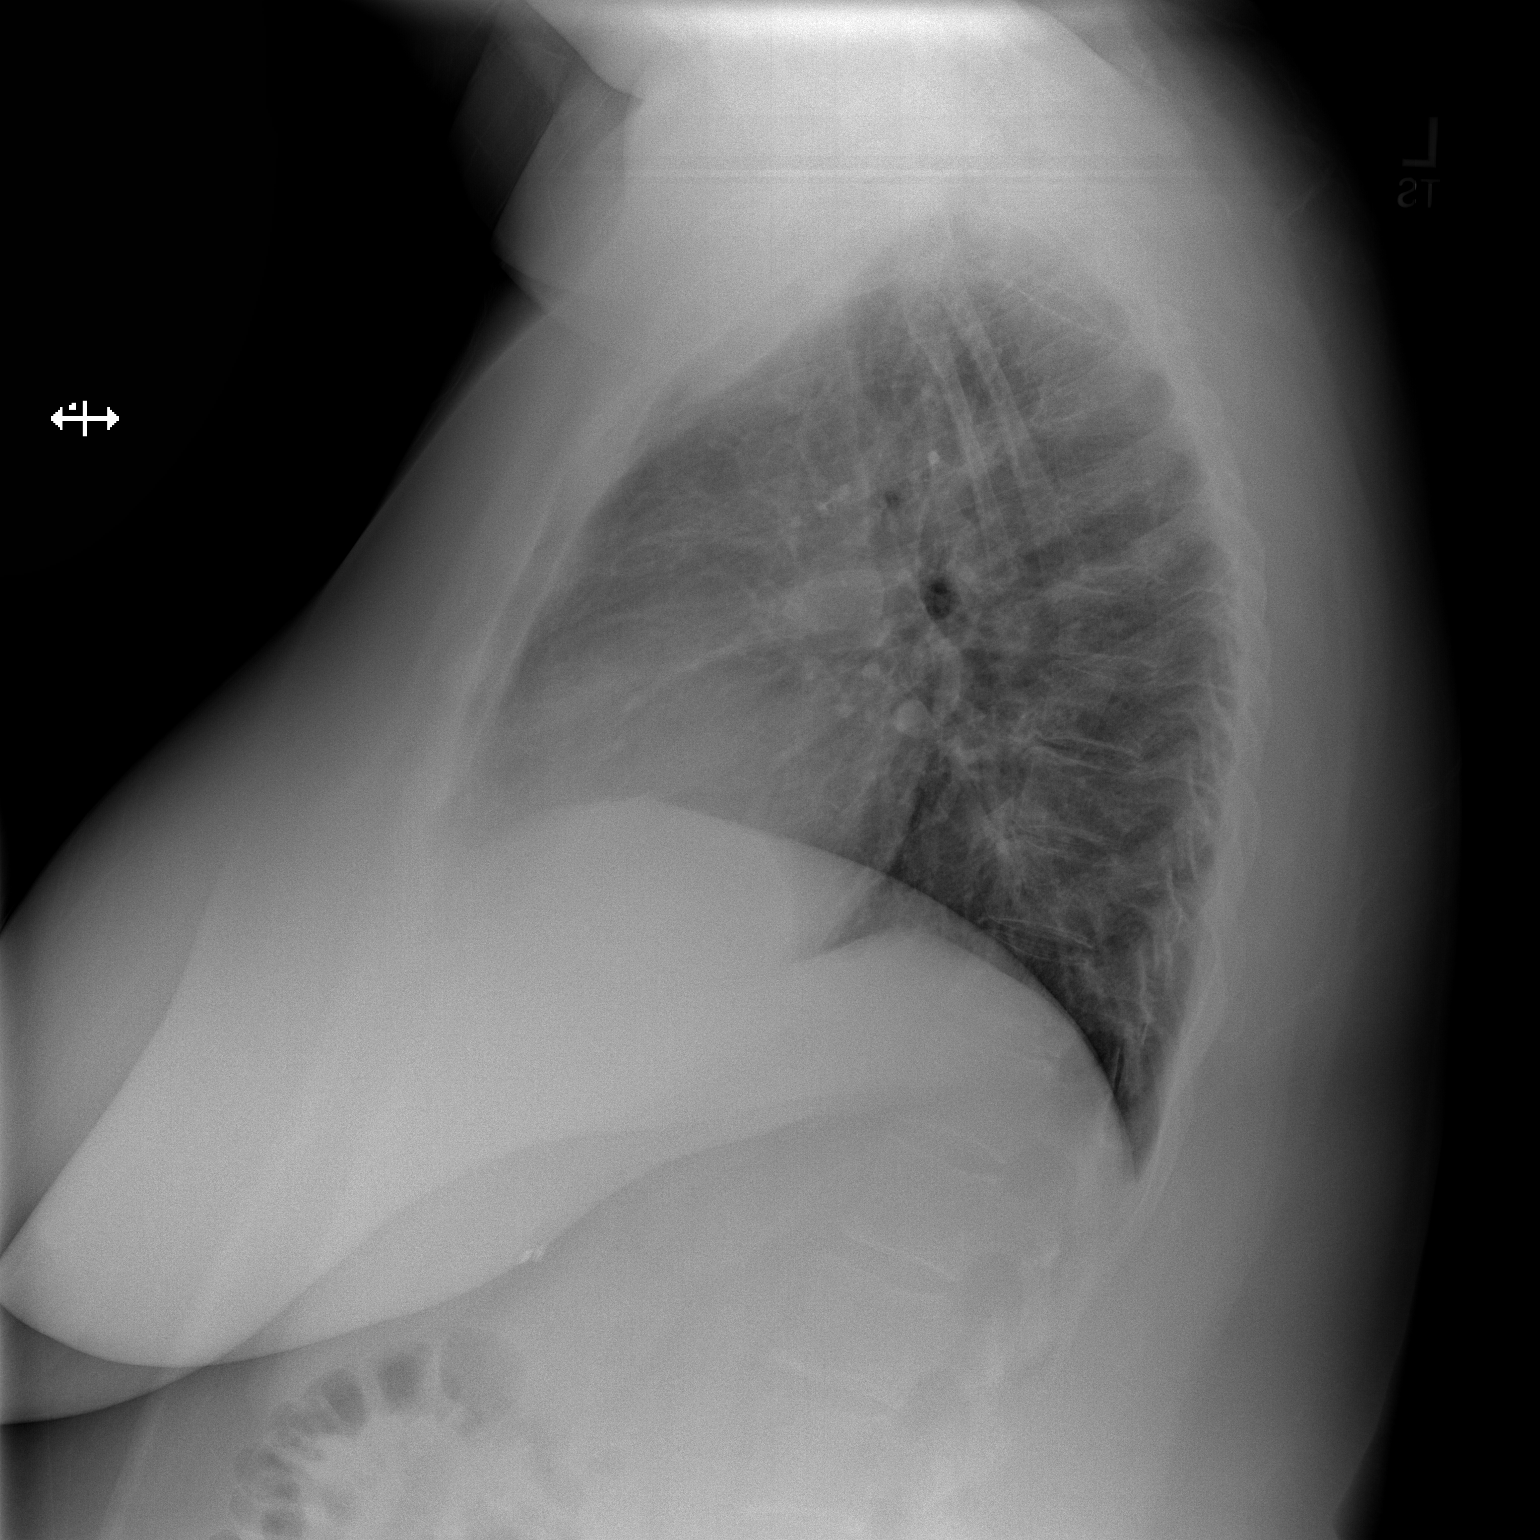

[2 of 2 positions shown; findings below may reference images not displayed]

FINDINGS: The cardiomediastinal contours are normal. The lungs are clear.
Pulmonary vasculature is normal. No consolidation, pleural effusion,
or pneumothorax. No acute osseous abnormalities are seen.
IMPRESSION: No acute pulmonary process.

## 2017-02-24 HISTORY — PX: RIGHT/LEFT HEART CATH AND CORONARY ANGIOGRAPHY: CATH118266

## 2017-03-20 ENCOUNTER — Encounter (INDEPENDENT_AMBULATORY_CARE_PROVIDER_SITE_OTHER): Payer: Self-pay

## 2017-03-20 ENCOUNTER — Encounter: Payer: Self-pay | Admitting: Pharmacist

## 2017-03-20 ENCOUNTER — Ambulatory Visit: Payer: Self-pay | Admitting: Pharmacy Technician

## 2017-03-20 DIAGNOSIS — Z79899 Other long term (current) drug therapy: Secondary | ICD-10-CM

## 2017-03-21 NOTE — Progress Notes (Signed)
Met with patient completed financial assistance application for [place] due to recent hospital visit.  Patient agreed to be responsible for gathering financial information and forwarding to appropriate department in [facility].    Completed [facility] application and contract.  Patient agreed to all terms of the [facility] contract.  Patient to provide 2017 taxes, SS Disability award letter from spouse, 69-T signed by spouse and last 30 days of checking account statements.  Provided patient with community resource material based on her particular needs.    Advair Diskus, Ventolin and Nitrostat Prescription Applications completed with patient.  Forwarded to Dr. Rolene Course for signature.  Upon receipt of signed applications from provider and proof of income from patient, Advair Diskus and Ventolin Prescription Applications will be submitted to [place].  Nitrostatt Prescription Application will be submitted to [employer].  [facility]

## 2017-03-27 ENCOUNTER — Ambulatory Visit: Payer: Self-pay | Admitting: Pharmacist

## 2017-03-27 VITALS — BP 110/80 | HR 100 | Ht 65.0 in | Wt 233.0 lb

## 2017-03-27 DIAGNOSIS — Z79899 Other long term (current) drug therapy: Secondary | ICD-10-CM

## 2017-03-27 NOTE — Progress Notes (Signed)
Medication Management Clinic Visit Note  Patient: Weston Fulco MRN: [id number] Date of Birth: [birth year] PCP: [name], MD   Evon Slack Guinn 51 y.o. female presents for an annual medication therapy visit with the pharmacist today. She was recently enrolled into our medication assistance program. She is scheduled to have an ICD implanted 03/29/17.  BP 110/80 (BP Location: Right Arm, Patient Position: Sitting, Cuff Size: Large)   Pulse 100 Comment: per patient's smart watch  Ht 5\' 5"  (1.651 m)   Wt 233 lb (105.7 kg)   LMP 01/22/2012 (Approximate) Comment: Hysterectomy 5 years ago  BMI 38.77 kg/m   Patient Information   Past Medical History:  Diagnosis Date  . Asthma 2013  . CHF (congestive heart failure) ([place])   . Diverticulitis 2010  . DVT (deep venous thrombosis) ([place])   . Endometriosis 1990  . Heart disease 2013  . Hx MRSA infection   . Lump or mass in breast   . Restless leg       Past Surgical History:  Procedure Laterality Date  . ABDOMINAL HYSTERECTOMY     age 57  . CESAREAN SECTION    . CHOLECYSTECTOMY    . COLECTOMY    . NASAL SINUS SURGERY  2012     Family History  Problem Relation Age of Onset  . Cancer Mother 30       ovarian  . Cancer Father 36       brain  . Cancer Daughter 18       skin  . Cancer Maternal Aunt 34       breast    New Diagnoses (since last visit):   Family Support: Good  Lifestyle Diet: NO Salt, NO Mrs. Dash Breakfast: skips Lunch: sandwich, fruit, salad  Dinner: chicken on grill and salad, occasionally steak, baked potatoe Drinks: Fluid restricted to 64 oz/day: coffee, water, Mt. Dew    Current Exercise Habits: The patient does not participate in regular exercise at present  Exercise limited by: cardiac condition(s)    History  Alcohol Use No      History  Smoking Status  . Never Smoker  Smokeless Tobacco  . Never Used      Health Maintenance  Topic Date Due  . TETANUS/TDAP  03/29/1987   . PAP SMEAR  03/28/1989  . INFLUENZA VACCINE  04/05/2017  . HIV Screening  Completed   Outpatient Encounter Prescriptions as of 03/27/2017  Medication Sig  . aspirin 81 MG chewable tablet Chew 1 tablet (81 mg total) by mouth daily.  . cyanocobalamin 500 MCG tablet Take 500 mcg by mouth daily.  . Fluticasone-Salmeterol (ADVAIR DISKUS) 500-50 MCG/DOSE AEPB Inhale 1 puff into the lungs 2 (two) times daily.  . furosemide (LASIX) 40 MG tablet Take 20 mg by mouth 2 (two) times daily.   Marland Kitchen guaiFENesin (ROBITUSSIN) 100 MG/5ML SOLN Take 5 mLs (100 mg total) by mouth every 4 (four) hours as needed for cough or to loosen phlegm.  Marland Kitchen losartan (COZAAR) 25 MG tablet Take 12.5 mg by mouth daily.  . metoprolol succinate (TOPROL-XL) 50 MG 24 hr tablet Take 50 mg by mouth daily. Take with or immediately following a meal.  . nitroGLYCERIN (NITROSTAT) 0.4 MG SL tablet Place 1 tablet (0.4 mg total) under the tongue every 5 (five) minutes as needed for chest pain.  Marland Kitchen rOPINIRole (REQUIP) 2 MG tablet Take 1 tablet (2 mg total) by mouth at bedtime. (Patient taking differently: Take 2 mg by mouth at bedtime. 2-4  Mg per night)  . spironolactone (ALDACTONE) 25 MG tablet Take 0.5 tablets (12.5 mg total) by mouth daily.  . traMADol (ULTRAM) 50 MG tablet Take 50 mg by mouth as needed.  . beclomethasone (QVAR) 40 MCG/ACT inhaler Inhale 2 puffs into the lungs 2 (two) times daily.  . [DISCONTINUED] albuterol (PROVENTIL HFA;VENTOLIN HFA) 108 (90 Base) MCG/ACT inhaler Inhale 2 puffs into the lungs every 6 (six) hours as needed for wheezing or shortness of breath.  . [DISCONTINUED] carvedilol (COREG) 3.125 MG tablet Take 1 tablet (3.125 mg total) by mouth 2 (two) times daily with a meal.  . [DISCONTINUED] levofloxacin (LEVAQUIN) 500 MG tablet Take 1 tablet (500 mg total) by mouth daily.  . [DISCONTINUED] lisinopril (PRINIVIL,ZESTRIL) 5 MG tablet Take 1 tablet (5 mg total) by mouth daily.  . [DISCONTINUED] predniSONE (DELTASONE) 10  MG tablet 40 mg po daily for 2 days, 20 mg po daily for 2 days, 10 mg po daily for 2 days.   No facility-administered encounter medications on file as of 03/27/2017.    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  Seeing providers at [facility] and [facility]. 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 History of left and right heart catheterization that showed no significant coronary disease.  Strong positive family history for CAD and sudden cardiac arrest. Patient has 3 sisters all of which have multiple cardiac stents. Father had a pacemaker at age 85. Mother deceased age 45, maternal aunt deceased age 37, cousin deceased age 37, all due to sudden cardiac death.  Patient also states that Fibromuscular Dysplasia runs in her family. Patient has no prior smoking history.  Used her Nitroglycerin two nights ago without relief. Patient was seen by her MD. Patient does not use salt or salt preservatives. She limits her fluid intake to 64 oz. Per day. States she has a history of low blood pressure; today's reading = 110/80 mm HG. Current regimen includes: aspirin, losartan, furosemide, metoprolol succinate, and spironolactone (Above Information retrieved from [place] and patient recollection)  Asthma Previously on Symbicort and QVAR. Patient is now on Advair twice daily, [facility] will order this through patient assistance.  Explained to patient that the QVAR was not a rescue inhaler, she was using it as needed. Patient does not have an albuterol inhaler  at this time.  Restless Leg Syndrome Currently on ropinirole, no issues today.  DVT hx Not on any anticoagulants  OSA on CPAP No current issues.  Hx of Diverticulitis No recent flares  Hx of Breast Cancer Receives mammograms every 6 months   PLAN Follow up MTM with pharmacist 07/10/17  Tymira Horkey K. Dicky Doe, PharmD [facility] [place] Operations Coordinator [phone]

## 2017-03-29 DIAGNOSIS — Z95810 Presence of automatic (implantable) cardiac defibrillator: Secondary | ICD-10-CM

## 2017-03-29 HISTORY — PX: CARDIAC DEFIBRILLATOR PLACEMENT: SHX171

## 2017-03-29 HISTORY — DX: Presence of automatic (implantable) cardiac defibrillator: Z95.810

## 2017-04-18 IMAGING — CT CT ABD-PELV W/O CM
2 of 4 series · 16 of 46 positions shown, 18 images · non-contrast
Comparison: Most recent CT 09/07/2015

CLINICAL DATA: Left-sided abdominal pain for 5 days. Diarrhea and
vomiting. History of kidney stones and diverticulitis.

EXAM:
CT ABDOMEN AND PELVIS WITHOUT CONTRAST
TECHNIQUE: Multidetector CT imaging of the abdomen and pelvis was performed
following the standard protocol without IV contrast. No contrast
administered due to reported allergy.

[Series 2: routine abd pel wo · axial · 0.86mm/px · z∈[+282,+702]mm · 13 of 92 slices shown, 15 images]
[im 4/92  soft-tissue]
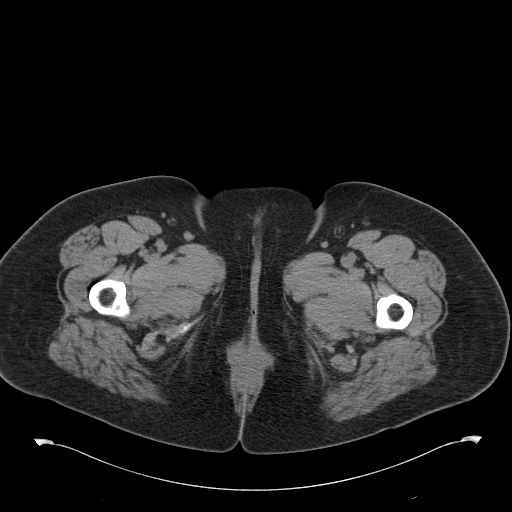
[im 4/92  bone]
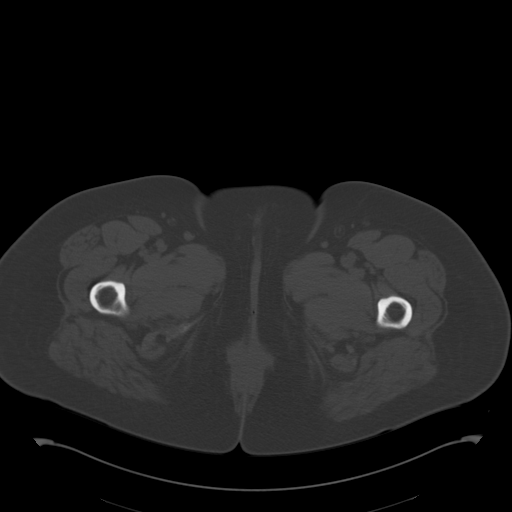
[im 11/92  soft-tissue]
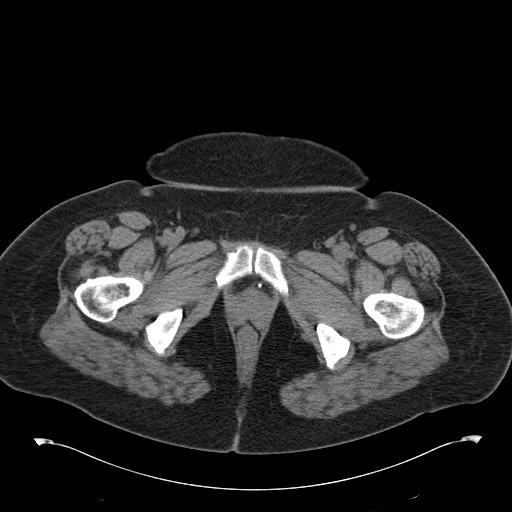
[im 19/92  soft-tissue]
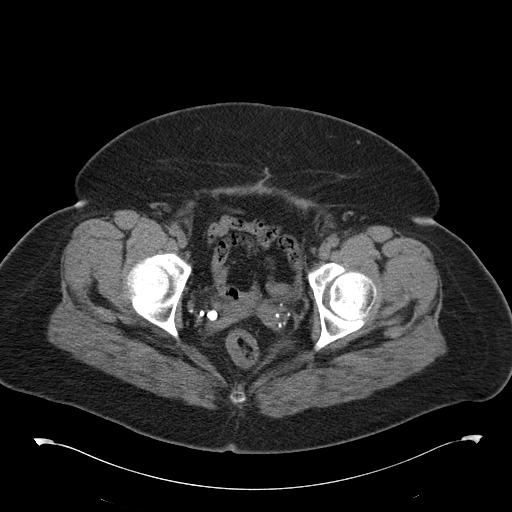
[im 26/92  soft-tissue]
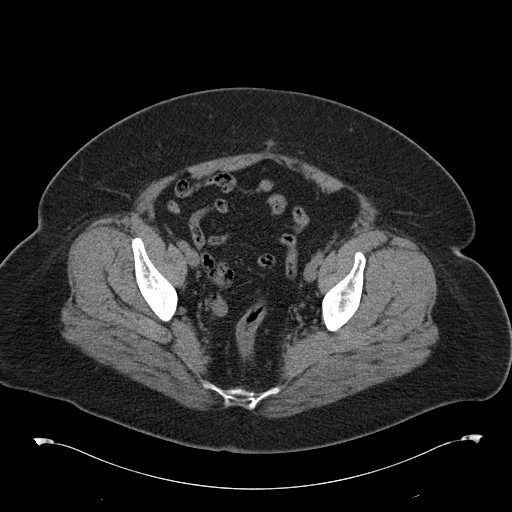
[im 33/92  soft-tissue]
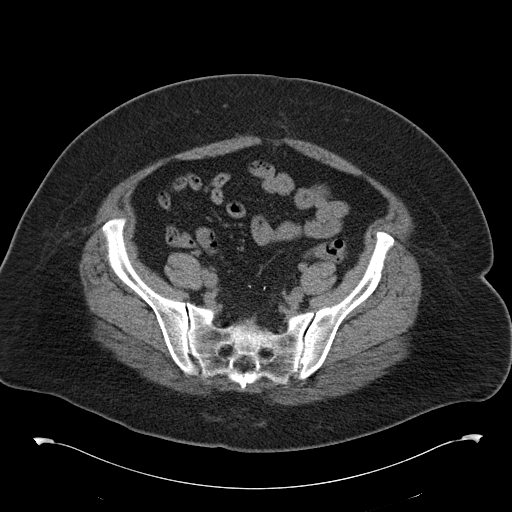
[im 41/92  soft-tissue]
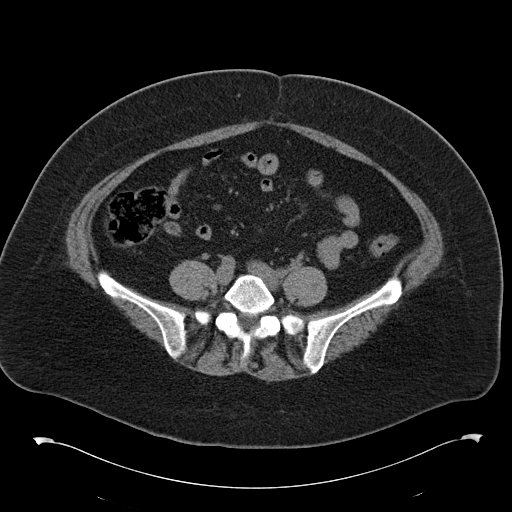
[im 48/92  soft-tissue]
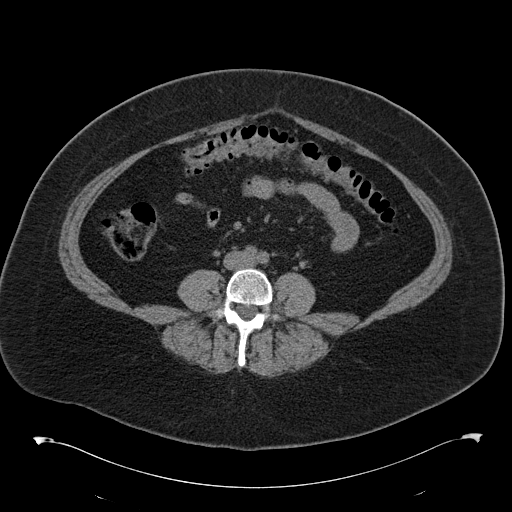
[im 51/92  soft-tissue]
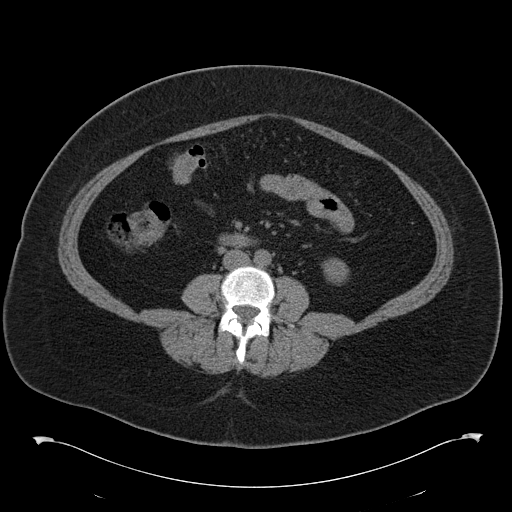
[im 59/92  soft-tissue]
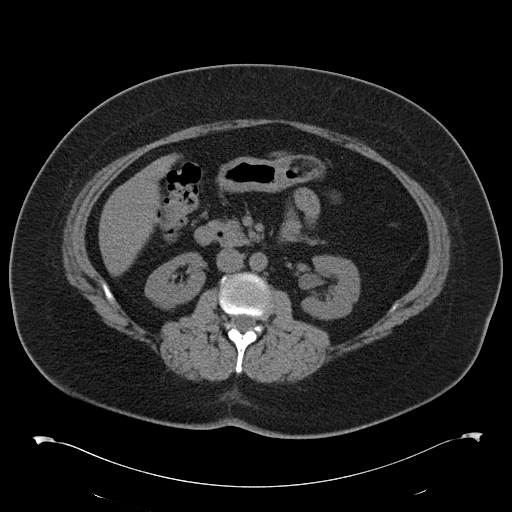
[im 59/92  bone]
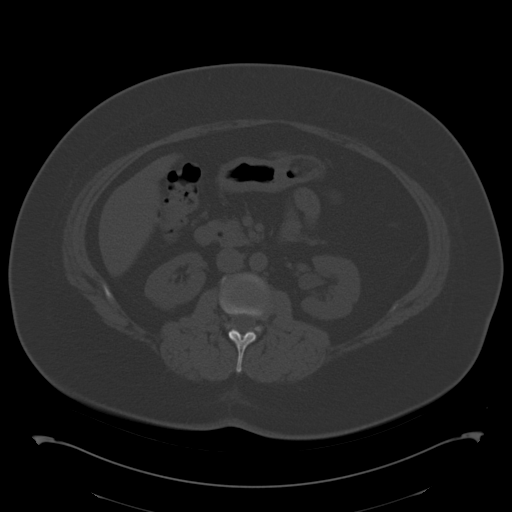
[im 66/92  soft-tissue]
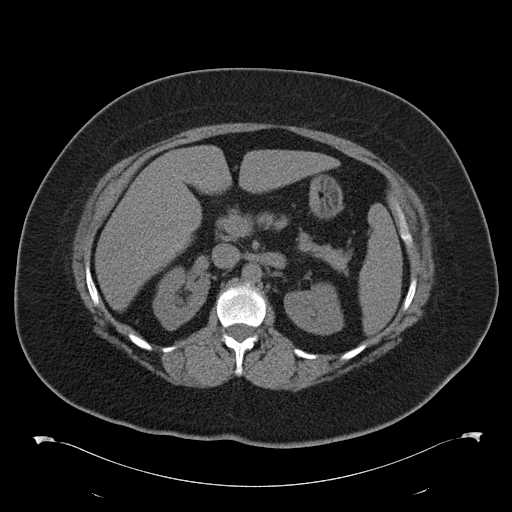
[im 73/92  soft-tissue]
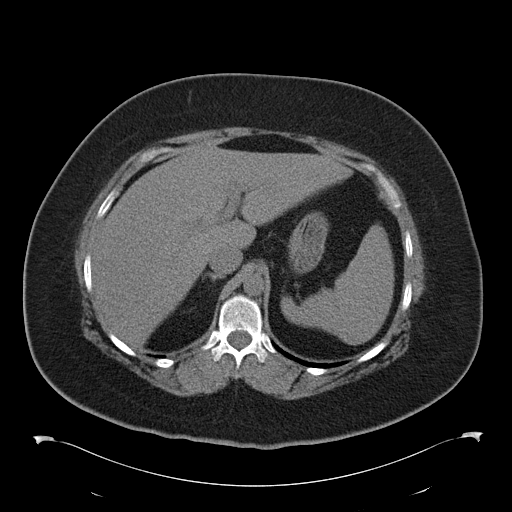
[im 81/92  soft-tissue]
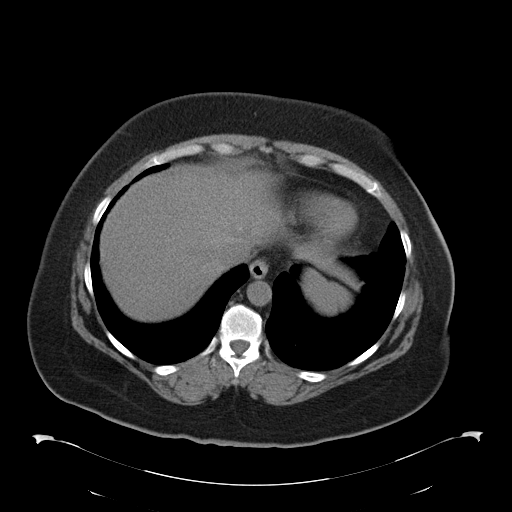
[im 88/92  soft-tissue]
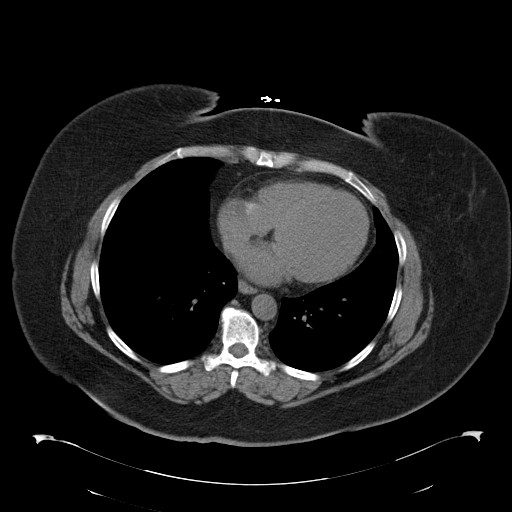

[Series 5: cor routine abd pel wo · coronal · 0.87mm/px · 3 of 165 slices shown]
[im 55/165  soft-tissue]
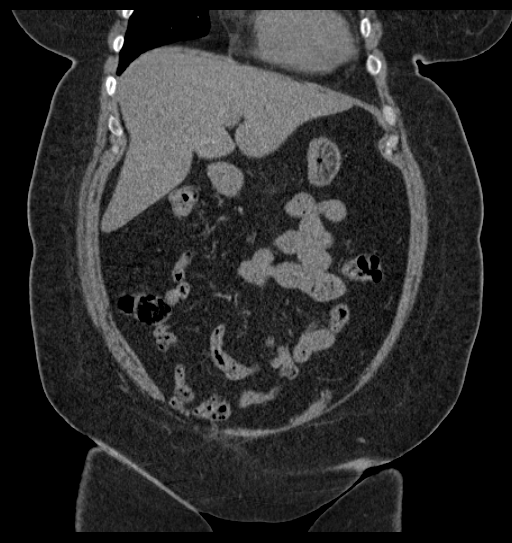
[im 73/165  soft-tissue]
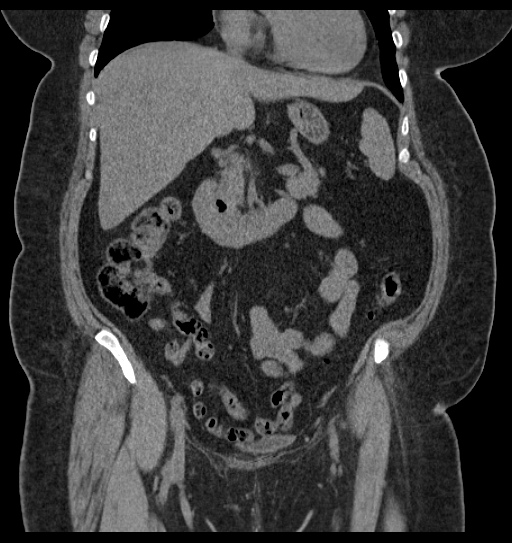
[im 92/165  soft-tissue]
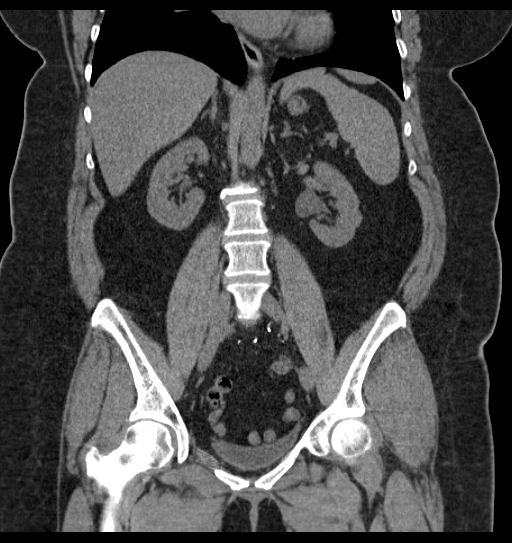

[16 of 46 positions shown; findings below may reference images not displayed]

FINDINGS: Lower chest: The included lung bases are clear. Subpleural scarring
in the right lower lobe.

Liver: Mild steatosis. No evidence of focal lesion allowing for lack
contrast.

Hepatobiliary: Clips in the gallbladder fossa postcholecystectomy.
No biliary dilatation.

Pancreas: No ductal dilatation or inflammation.

Spleen: Normal.

Adrenal glands: No nodule.

Kidneys: Extrarenal pelvis configuration of the left kidney. Kidneys
are symmetric in size without stones or hydronephrosis. There is no
perinephric stranding. Both ureters are decompressed without stones
along the course.

Stomach/Bowel: Stomach is decompressed. There are no dilated or
thickened small bowel loops. Small volume of stool throughout the
colon without colonic wall thickening. Scattered colonic
diverticula. Enteric chain sutures in the sigmoid colon. The
appendix is not visualized, surgically absent per report.

Vascular/Lymphatic: No retroperitoneal adenopathy. Abdominal aorta
is normal in caliber.

Reproductive: The uterus is surgically absent no adnexal mass.

Bladder: Decompressed, no bladder stone.

Other: No free air, free fluid, or intra-abdominal fluid collection.

Musculoskeletal: There are no acute or suspicious osseous
abnormalities.
IMPRESSION: No acute abnormality in the abdomen/pelvis.

No renal stones or obstructive uropathy.

Minimal diverticulosis without diverticulitis.

## 2017-04-24 ENCOUNTER — Emergency Department: Payer: Medicaid Other

## 2017-04-24 ENCOUNTER — Encounter: Payer: Self-pay | Admitting: Emergency Medicine

## 2017-04-24 ENCOUNTER — Observation Stay
Admission: EM | Admit: 2017-04-24 | Discharge: 2017-04-26 | Disposition: A | Discharge status: Home / Self Care | Payer: Medicaid Other | Attending: Internal Medicine | Admitting: Internal Medicine

## 2017-04-24 DIAGNOSIS — Z880 Allergy status to penicillin: Secondary | ICD-10-CM | POA: Diagnosis not present

## 2017-04-24 DIAGNOSIS — E876 Hypokalemia: Secondary | ICD-10-CM | POA: Diagnosis not present

## 2017-04-24 DIAGNOSIS — G2581 Restless legs syndrome: Secondary | ICD-10-CM | POA: Diagnosis not present

## 2017-04-24 DIAGNOSIS — J441 Chronic obstructive pulmonary disease with (acute) exacerbation: Secondary | ICD-10-CM | POA: Diagnosis present

## 2017-04-24 DIAGNOSIS — J181 Lobar pneumonia, unspecified organism: Secondary | ICD-10-CM

## 2017-04-24 DIAGNOSIS — J45901 Unspecified asthma with (acute) exacerbation: Secondary | ICD-10-CM | POA: Insufficient documentation

## 2017-04-24 DIAGNOSIS — Z7982 Long term (current) use of aspirin: Secondary | ICD-10-CM | POA: Diagnosis not present

## 2017-04-24 DIAGNOSIS — I5022 Chronic systolic (congestive) heart failure: Secondary | ICD-10-CM | POA: Insufficient documentation

## 2017-04-24 DIAGNOSIS — J189 Pneumonia, unspecified organism: Secondary | ICD-10-CM | POA: Diagnosis not present

## 2017-04-24 DIAGNOSIS — Z79899 Other long term (current) drug therapy: Secondary | ICD-10-CM | POA: Insufficient documentation

## 2017-04-24 DIAGNOSIS — I429 Cardiomyopathy, unspecified: Secondary | ICD-10-CM | POA: Diagnosis not present

## 2017-04-24 DIAGNOSIS — Z95810 Presence of automatic (implantable) cardiac defibrillator: Secondary | ICD-10-CM | POA: Insufficient documentation

## 2017-04-24 DIAGNOSIS — R7989 Other specified abnormal findings of blood chemistry: Secondary | ICD-10-CM | POA: Diagnosis not present

## 2017-04-24 DIAGNOSIS — I4891 Unspecified atrial fibrillation: Secondary | ICD-10-CM | POA: Diagnosis present

## 2017-04-24 DIAGNOSIS — J449 Chronic obstructive pulmonary disease, unspecified: Secondary | ICD-10-CM | POA: Diagnosis present

## 2017-04-24 LAB — BASIC METABOLIC PANEL
Anion gap: 7 (ref 5–15)
BUN: 10 mg/dL (ref 6–20)
CHLORIDE: 104 mmol/L (ref 101–111)
CO2: 28 mmol/L (ref 22–32)
Calcium: 9.4 mg/dL (ref 8.9–10.3)
Creatinine, Ser: 0.91 mg/dL (ref 0.44–1.00)
GFR calc non Af Amer: >60 mL/min (ref >60)
Glucose, Bld: 113 mg/dL — ABNORMAL HIGH (ref 65–99)
POTASSIUM: 3.2 mmol/L — AB (ref 3.5–5.1)
SODIUM: 139 mmol/L (ref 135–145)

## 2017-04-24 LAB — BRAIN NATRIURETIC PEPTIDE: B Natriuretic Peptide: 198 pg/mL — ABNORMAL HIGH (ref 0.0–100.0)

## 2017-04-24 LAB — CBC
HEMATOCRIT: 34.3 % — AB (ref 35.0–47.0)
Hemoglobin: 11.6 g/dL — ABNORMAL LOW (ref 12.0–16.0)
MCH: 25.4 pg — ABNORMAL LOW (ref 26.0–34.0)
MCHC: 33.7 g/dL (ref 32.0–36.0)
MCV: 75.3 fL — AB (ref 80.0–100.0)
Platelets: 238 10*3/uL (ref 150–440)
RBC: 4.55 MIL/uL (ref 3.80–5.20)
RDW: 17.4 % — ABNORMAL HIGH (ref 11.5–14.5)
WBC: 8.7 10*3/uL (ref 3.6–11.0)

## 2017-04-24 LAB — TROPONIN I: Troponin I: <0.03 ng/mL (ref <0.03)

## 2017-04-24 MED ORDER — IPRATROPIUM-ALBUTEROL 0.5-2.5 (3) MG/3ML IN SOLN
3.0000 mL | Freq: Once | RESPIRATORY_TRACT | Status: AC
  Administered 2017-04-24: 3 mL via RESPIRATORY_TRACT
  Filled 2017-04-24: qty 3

## 2017-04-24 MED ORDER — ACETAMINOPHEN 500 MG PO TABS
1000.0000 mg | ORAL_TABLET | Freq: Once | ORAL | Status: AC
  Administered 2017-04-24: 1000 mg via ORAL
  Filled 2017-04-24: qty 2

## 2017-04-24 MED ORDER — PROMETHAZINE HCL 25 MG/ML IJ SOLN
INTRAMUSCULAR | Status: AC
  Administered 2017-04-24: 12.5 mg via INTRAVENOUS
  Filled 2017-04-24: qty 1

## 2017-04-24 MED ORDER — ALBUTEROL SULFATE (2.5 MG/3ML) 0.083% IN NEBU
5.0000 mg | INHALATION_SOLUTION | Freq: Once | RESPIRATORY_TRACT | Status: AC
  Administered 2017-04-24: 5 mg via RESPIRATORY_TRACT
  Filled 2017-04-24: qty 6

## 2017-04-24 MED ORDER — DEXTROSE 5 % IV SOLN
1.0000 g | INTRAVENOUS | Status: DC
  Administered 2017-04-25: 1 g via INTRAVENOUS
  Filled 2017-04-24 (×2): qty 10

## 2017-04-24 MED ORDER — MORPHINE SULFATE (PF) 4 MG/ML IV SOLN
4.0000 mg | Freq: Once | INTRAVENOUS | Status: AC
  Administered 2017-04-24: 4 mg via INTRAVENOUS
  Filled 2017-04-24: qty 1

## 2017-04-24 MED ORDER — ONDANSETRON HCL 4 MG/2ML IJ SOLN
INTRAMUSCULAR | Status: AC
  Filled 2017-04-24: qty 2

## 2017-04-24 MED ORDER — AZITHROMYCIN 500 MG IV SOLR
500.0000 mg | Freq: Once | INTRAVENOUS | Status: AC
  Administered 2017-04-24: 500 mg via INTRAVENOUS
  Filled 2017-04-24: qty 500

## 2017-04-24 MED ORDER — DEXTROSE 5 % IV SOLN
500.0000 mg | INTRAVENOUS | Status: DC
  Administered 2017-04-25: 500 mg via INTRAVENOUS
  Filled 2017-04-24 (×2): qty 500

## 2017-04-24 MED ORDER — SODIUM CHLORIDE 0.9 % IV BOLUS (SEPSIS)
1000.0000 mL | Freq: Once | INTRAVENOUS | Status: AC
  Administered 2017-04-24: 1000 mL via INTRAVENOUS

## 2017-04-24 MED ORDER — METHYLPREDNISOLONE SODIUM SUCC 125 MG IJ SOLR
125.0000 mg | Freq: Once | INTRAMUSCULAR | Status: AC
Start: 2017-04-24 — End: 2017-04-24
  Administered 2017-04-24: 125 mg via INTRAVENOUS
  Filled 2017-04-24: qty 2

## 2017-04-24 MED ORDER — SODIUM CHLORIDE 0.9 % IV BOLUS (SEPSIS)
500.0000 mL | Freq: Once | INTRAVENOUS | Status: AC
  Administered 2017-04-24: 500 mL via INTRAVENOUS

## 2017-04-24 MED ORDER — CEFTRIAXONE SODIUM 1 G IJ SOLR
1.0000 g | Freq: Once | INTRAMUSCULAR | Status: AC
  Administered 2017-04-24: 1 g via INTRAVENOUS
  Filled 2017-04-24: qty 10

## 2017-04-24 MED ORDER — PROMETHAZINE HCL 25 MG/ML IJ SOLN
12.5000 mg | Freq: Once | INTRAMUSCULAR | Status: AC
  Administered 2017-04-24: 12.5 mg via INTRAVENOUS

## 2017-04-24 NOTE — ED Provider Notes (Signed)
[facility] Emergency Department Provider Note  ____________________________________________  Time seen: Approximately 10:03 PM  I have reviewed the triage vital signs and the nursing notes.   HISTORY  Chief Complaint Chest Pain    HPI Brittany Nguyen is a 51 y.o. female who complains of shortness of breath as well as mid upper chest pain described as tightness radiating to the back over the last 4-5 hours. She's had worsening shortness of breath and nonproductive cough for the past several days. Also has fevers chills and body aches. Denies any rhinorrhea or sore throat. No joint pains or rash. No abdominal pain or vomiting or diarrhea. Denies dizziness. Pain has no aggravating or alleviating factors. Mild to moderate intensity. Shortness of breath is moderate.     Past Medical History:  Diagnosis Date  . Asthma 2013  . CHF (congestive heart failure) ([place])   . Diverticulitis 2010  . DVT (deep venous thrombosis) ([place])   . Endometriosis 1990  . Heart disease 2013  . Hx MRSA infection   . Lump or mass in breast   . Restless leg      Patient Active Problem List   Diagnosis Date Noted  . Chronic systolic heart failure ([place]) 02/03/2017  . Hypotension 02/03/2017  . Chest pain 12/09/2016  . RLS (restless legs syndrome) 12/09/2016  . Cough, persistent 11/22/2015  . Hypokalemia 11/22/2015  . Leukocytosis 11/22/2015  . Dyspnea 11/21/2015  . Respiratory distress 09/19/2015  . Breast pain 01/11/2013  . Hx MRSA infection   . Lump or mass in breast   . GOITER, MULTINODULAR 01/20/2009  . HYPOGLYCEMIA, UNSPECIFIED 01/20/2009  . GERD 01/20/2009  . DIVERTICULITIS OF COLON 01/20/2009     Past Surgical History:  Procedure Laterality Date  . ABDOMINAL HYSTERECTOMY     age 55  . CARDIAC DEFIBRILLATOR PLACEMENT    . CESAREAN SECTION    . CHOLECYSTECTOMY    . COLECTOMY    . NASAL SINUS SURGERY  2012     Prior to Admission medications    Medication Sig Start Date End Date Taking? Authorizing Provider  aspirin 81 MG chewable tablet Chew 1 tablet (81 mg total) by mouth daily. 12/09/16  Yes [name], MD  cyanocobalamin 500 MCG tablet Take 500 mcg by mouth daily.   Yes [provider]  Fluticasone-Salmeterol (ADVAIR DISKUS) 500-50 MCG/DOSE AEPB Inhale 1 puff into the lungs 2 (two) times daily. 01/24/17 01/24/18 Yes Sainani, Belia Heman, MD  furosemide (LASIX) 40 MG tablet Take 20 mg by mouth 2 (two) times daily.    Yes [provider]  guaiFENesin (ROBITUSSIN) 100 MG/5ML SOLN Take 5 mLs (100 mg total) by mouth every 4 (four) hours as needed for cough or to loosen phlegm. 01/30/17  Yes [name], MD  losartan (COZAAR) 25 MG tablet Take 12.5 mg by mouth daily.   Yes [provider]  metoprolol succinate (TOPROL-XL) 50 MG 24 hr tablet Take 50 mg by mouth daily. Take with or immediately following a meal.   Yes [provider]  nitroGLYCERIN (NITROSTAT) 0.4 MG SL tablet Place 1 tablet (0.4 mg total) under the tongue every 5 (five) minutes as needed for chest pain. 01/24/17  Yes Sainani, Belia Heman, MD  rOPINIRole (REQUIP) 2 MG tablet Take 1 tablet (2 mg total) by mouth at bedtime. Patient taking differently: Take 2 mg by mouth at bedtime. 2-4  Mg per night 05/14/15  Yes Summers, Rhonda L, PA-C  spironolactone (ALDACTONE) 25 MG tablet Take  0.5 tablets (12.5 mg total) by mouth daily. 01/31/17  Yes [name], MD  traMADol (ULTRAM) 50 MG tablet Take 50 mg by mouth as needed.   Yes [provider]  beclomethasone (QVAR) 40 MCG/ACT inhaler Inhale 2 puffs into the lungs 2 (two) times daily.    [provider]  carvedilol (COREG) 3.125 MG tablet Take 1 tablet by mouth 2 (two) times daily. 01/24/17   [provider]  lisinopril (PRINIVIL,ZESTRIL) 5 MG tablet Take 5 mg by mouth daily. 01/25/17   [provider]     Allergies Contrast media [iodinated diagnostic agents]; Latex; Ondansetron;  Penicillins; Povidone-iodine; and Pulmicort [budesonide]   Family History  Problem Relation Age of Onset  . Cancer Mother 30       ovarian  . Cancer Father 47       brain  . Cancer Daughter 18       skin  . Cancer Maternal Aunt 82       breast    Social History Social History  Substance Use Topics  . Smoking status: Never Smoker  . Smokeless tobacco: Never Used  . Alcohol use No    Review of Systems  Constitutional:   Positive subjective fever and chills.  ENT:   No sore throat. No rhinorrhea. Cardiovascular:   Positive as above chest pain without syncope. Respiratory:   Positive shortness of breath and nonproductive cough. Gastrointestinal:   Negative for abdominal pain, vomiting and diarrhea.  Musculoskeletal:  Positive bilateral lower extremity swelling. No focal pain All other systems reviewed and are negative except as documented above in ROS and HPI.  ____________________________________________   PHYSICAL EXAM:  VITAL SIGNS: ED Triage Vitals  Enc Vitals Group     BP 04/24/17 1953 (!) 97/49     Pulse Rate 04/24/17 1953 86     Resp 04/24/17 1953 20     Temp 04/24/17 1953 98.9 F (37.2 C)     Temp Source 04/24/17 1953 Oral     SpO2 04/24/17 1953 97 %     Weight 04/24/17 1951 225 lb (102.1 kg)     Height 04/24/17 1951 5\' 5"  (1.651 m)     Head Circumference --      Peak Flow --      Pain Score 04/24/17 1951 8     Pain Loc --      Pain Edu? --      Excl. in [place]? --     Vital signs reviewed, nursing assessments reviewed.   Constitutional:   Alert and oriented. Not in distress. Eyes:   No scleral icterus.  EOMI. No nystagmus. No conjunctival pallor. PERRL. ENT   Head:   Normocephalic and atraumatic.   Nose:   No congestion/rhinnorhea.    Mouth/Throat:   MMM, no pharyngeal erythema. No peritonsillar mass.    Neck:   No meningismus. Full ROM Hematological/Lymphatic/Immunilogical:   No cervical lymphadenopathy. Cardiovascular:   Irregularly  irregular rhythm, tachycardic heart rate 120. Symmetric bilateral radial and DP pulses.  No murmurs.  Respiratory:   Increased work of breathing, diffuse expiratory wheezing with coughing. Crackles at the right base. Good air entry in all lung fields.. Gastrointestinal:   Soft and nontender. Non distended. There is no CVA tenderness.  No rebound, rigidity, or guarding. Genitourinary:   deferred Musculoskeletal:   Normal range of motion in all extremities. No joint effusions.  No lower extremity tenderness.  Trace bilateral pedal edema Neurologic:   Normal speech and language.  Motor grossly intact. No gross focal neurologic deficits are appreciated.  Skin:    Skin is warm, dry and intact. No rash noted.  No petechiae, purpura, or bullae.  ____________________________________________    LABS (pertinent positives/negatives) (all labs ordered are listed, but only abnormal results are displayed) Labs Reviewed  BASIC METABOLIC PANEL - Abnormal; Notable for the following:       Result Value   Potassium 3.2 (*)    Glucose, Bld 113 (*)    All other components within normal limits  CBC - Abnormal; Notable for the following:    Hemoglobin 11.6 (*)    HCT 34.3 (*)    MCV 75.3 (*)    MCH 25.4 (*)    RDW 17.4 (*)    All other components within normal limits  BRAIN NATRIURETIC PEPTIDE - Abnormal; Notable for the following:    B Natriuretic Peptide 198.0 (*)    All other components within normal limits  TROPONIN I   ____________________________________________   EKG  Interpreted by me Atrial fibrillation rate 129, left axis, poor R-wave progression in anterior precordial leads. No acute ischemic changes. 5 PVCs on the strip  ____________________________________________    RADIOLOGY  Dg Chest 2 View  Result Date: 04/24/2017 CLINICAL DATA:  Mid upper chest pain.  Nonproductive cough. EXAM: CHEST  2 VIEW COMPARISON:  Jan 30, 2017 FINDINGS: An AICD device has been placed in the interval  with leads projected over the right age been right ventricle. No pneumothorax. The cardiomediastinal silhouette is stable. Mild haziness over lateral right lung base is identified. No other abnormalities or changes. IMPRESSION: 1. Mild haziness over the right lung base not seen previously may represent atelectasis versus an early infiltrate. Recommend follow-up as clinically warranted. No other abnormalities. Electronically Signed   By: Dorise Bullion III M.D   On: 04/24/2017 20:12    ____________________________________________   PROCEDURES Procedures  ____________________________________________   INITIAL IMPRESSION / ASSESSMENT AND PLAN / ED COURSE  Pertinent labs & imaging results that were available during my care of the patient were reviewed by me and considered in my medical decision making (see chart for details).    Clinical Course as of Apr 25 2315  Mon Apr 24, 2017  2115 P/w dyspnea, cough, exam and cxr c/w CAP. Has afib rvr on exam, will give IVF, ceftriaxone, azithro (minor pcn allergy. Levaquin would worsen afib rate control). BDs for pulm sx. Reassess.  [PS]  2313 Not improved, still wheezing. HR still 130 - afib with rvr. Cont. Ivf, plan to admit. BP improved with ivf.   [PS]    Clinical Course User Index [PS] Carrie Mew, MD     ____________________________________________   FINAL CLINICAL IMPRESSION(S) / ED DIAGNOSES  Final diagnoses:  Community acquired pneumonia of right lower lobe of lung ([place])  Atrial fibrillation with rapid ventricular response ([place])  COPD exacerbation ([place])      New Prescriptions   No medications on file     Portions of this note were generated with dragon dictation software. Dictation errors may occur despite best attempts at proofreading.    Carrie Mew, MD 04/24/17 [phone]

## 2017-04-24 NOTE — Progress Notes (Signed)
Pharmacy Antibiotic Note  Brittany Nguyen is a 51 y.o. female admitted on 04/24/2017 with pneumonia.  Pharmacy has been consulted for azithromycin and ceftriaxone dosing.  Plan: Azithromycin 500 mg IV and ceftriaxone 1 gram IV daily ordered.  Height: 5\' 5"  (165.1 cm) Weight: 225 lb (102.1 kg) IBW/kg (Calculated) : 57  Temp (24hrs), Avg:98.9 F (37.2 C), Min:98.9 F (37.2 C), Max:98.9 F (37.2 C)   Recent Labs Lab 04/24/17 2022  WBC 8.7  CREATININE 0.91    Estimated Creatinine Clearance: 88.5 mL/min (by C-G formula based on SCr of 0.91 mg/dL).    Allergies  Allergen Reactions  . Contrast Media [Iodinated Diagnostic Agents] Shortness Of Breath  . Latex Hives  . Ondansetron Other (See Comments)    Severe headache  . Penicillins Hives and Other (See Comments)    Has patient had a PCN reaction causing immediate rash, facial/tongue/throat swelling, SOB or lightheadedness with hypotension: Yes Has patient had a PCN reaction causing severe rash involving mucus membranes or skin necrosis: No Has patient had a PCN reaction that required hospitalization No Has patient had a PCN reaction occurring within the last 10 years: No If all of the above answers are "NO", then may proceed with Cephalosporin use.   . Povidone-Iodine Rash  . Pulmicort [Budesonide] Itching    Antimicrobials this admission: Azithromycin. ceftriaxone 8/20 >>    >>   Dose adjustments this admission:   Microbiology results: No micro      CXR: atelectasis vs. infiltrate Thank you for allowing pharmacy to be a part of this patient's care.  Eldin Bonsell S 04/24/2017 10:44 PM

## 2017-04-24 NOTE — ED Triage Notes (Addendum)
Pt to triage via w/c with frequent coughing noted; c/o mid upper CP radiating into back x 4-5hours; also reports nonprod cough several days; hx of same; defib in place; charge nurse called for room assignment

## 2017-04-25 ENCOUNTER — Encounter: Payer: Self-pay | Admitting: *Deleted

## 2017-04-25 ENCOUNTER — Inpatient Hospital Stay (HOSPITAL_BASED_OUTPATIENT_CLINIC_OR_DEPARTMENT_OTHER)
Admit: 2017-04-25 | Discharge: 2017-04-25 | Disposition: A | Discharge status: Home / Self Care | Payer: Medicaid Other | Attending: Family Medicine | Admitting: Family Medicine

## 2017-04-25 ENCOUNTER — Inpatient Hospital Stay: Payer: Medicaid Other

## 2017-04-25 DIAGNOSIS — I4891 Unspecified atrial fibrillation: Secondary | ICD-10-CM

## 2017-04-25 DIAGNOSIS — J189 Pneumonia, unspecified organism: Secondary | ICD-10-CM | POA: Diagnosis present

## 2017-04-25 LAB — CBC
HEMATOCRIT: 33.6 % — AB (ref 35.0–47.0)
Hemoglobin: 11.1 g/dL — ABNORMAL LOW (ref 12.0–16.0)
MCH: 25.7 pg — ABNORMAL LOW (ref 26.0–34.0)
MCHC: 33.1 g/dL (ref 32.0–36.0)
MCV: 77.7 fL — ABNORMAL LOW (ref 80.0–100.0)
PLATELETS: 220 10*3/uL (ref 150–440)
RBC: 4.32 MIL/uL (ref 3.80–5.20)
RDW: 17.8 % — AB (ref 11.5–14.5)
WBC: 6.8 10*3/uL (ref 3.6–11.0)

## 2017-04-25 LAB — LIPID PANEL
Cholesterol: 206 mg/dL — ABNORMAL HIGH (ref 0–200)
HDL: 45 mg/dL (ref >40)
LDL CALC: 142 mg/dL — AB (ref 0–99)
TRIGLYCERIDES: 94 mg/dL (ref <150)
Total CHOL/HDL Ratio: 4.6 RATIO
VLDL: 19 mg/dL (ref 0–40)

## 2017-04-25 LAB — PROTIME-INR
INR: 1.1
PROTHROMBIN TIME: 14.2 s (ref 11.4–15.2)

## 2017-04-25 LAB — ECHOCARDIOGRAM COMPLETE
Height: 65 in
WEIGHTICAEL: 3718.4 [oz_av]

## 2017-04-25 LAB — FIBRIN DERIVATIVES D-DIMER (ARMC ONLY): FIBRIN DERIVATIVES D-DIMER (ARMC): 834.94 — AB (ref 0.00–499.00)

## 2017-04-25 LAB — TROPONIN I: Troponin I: <0.03 ng/mL (ref <0.03)

## 2017-04-25 LAB — BASIC METABOLIC PANEL
ANION GAP: 8 (ref 5–15)
BUN: 9 mg/dL (ref 6–20)
CHLORIDE: 107 mmol/L (ref 101–111)
CO2: 24 mmol/L (ref 22–32)
Calcium: 9.2 mg/dL (ref 8.9–10.3)
Creatinine, Ser: 0.9 mg/dL (ref 0.44–1.00)
GFR calc Af Amer: >60 mL/min (ref >60)
GFR calc non Af Amer: >60 mL/min (ref >60)
GLUCOSE: 215 mg/dL — AB (ref 65–99)
Potassium: 4 mmol/L (ref 3.5–5.1)
Sodium: 139 mmol/L (ref 135–145)

## 2017-04-25 LAB — MAGNESIUM: Magnesium: 1.9 mg/dL (ref 1.7–2.4)

## 2017-04-25 LAB — TSH: TSH: 0.604 u[IU]/mL (ref 0.350–4.500)

## 2017-04-25 MED ORDER — SODIUM CHLORIDE 0.9 % IV SOLN
INTRAVENOUS | Status: DC
  Administered 2017-04-25 – 2017-04-26 (×2): via INTRAVENOUS

## 2017-04-25 MED ORDER — BUDESONIDE 0.25 MG/2ML IN SUSP: 0.2500 mg | Freq: Two times a day (BID) | RESPIRATORY_TRACT | Status: DC

## 2017-04-25 MED ORDER — DEXTROSE 5 % IV SOLN: 500.0000 mg | INTRAVENOUS | Status: DC

## 2017-04-25 MED ORDER — GUAIFENESIN 100 MG/5ML PO SOLN
5.0000 mL | ORAL | Status: DC | PRN
  Administered 2017-04-25 (×2): 100 mg via ORAL
  Filled 2017-04-25 (×4): qty 5

## 2017-04-25 MED ORDER — GUAIFENESIN ER 600 MG PO TB12
600.0000 mg | ORAL_TABLET | Freq: Two times a day (BID) | ORAL | Status: DC
  Administered 2017-04-25 – 2017-04-26 (×3): 600 mg via ORAL
  Filled 2017-04-25 (×3): qty 1

## 2017-04-25 MED ORDER — ENOXAPARIN SODIUM 120 MG/0.8ML ~~LOC~~ SOLN
1.0000 mg/kg | Freq: Two times a day (BID) | SUBCUTANEOUS | Status: DC
  Administered 2017-04-25 – 2017-04-26 (×3): 105 mg via SUBCUTANEOUS
  Filled 2017-04-25 (×4): qty 0.8

## 2017-04-25 MED ORDER — POTASSIUM CHLORIDE CRYS ER 20 MEQ PO TBCR
40.0000 meq | EXTENDED_RELEASE_TABLET | Freq: Two times a day (BID) | ORAL | Status: AC
  Administered 2017-04-25 (×2): 40 meq via ORAL
  Filled 2017-04-25 (×2): qty 2

## 2017-04-25 MED ORDER — ROPINIROLE HCL 1 MG PO TABS
2.0000 mg | ORAL_TABLET | Freq: Every day | ORAL | Status: DC
  Administered 2017-04-25 (×2): 2 mg via ORAL
  Filled 2017-04-25 (×2): qty 2

## 2017-04-25 MED ORDER — VITAMIN B-12 1000 MCG PO TABS
500.0000 ug | ORAL_TABLET | Freq: Every day | ORAL | Status: DC
  Administered 2017-04-25 – 2017-04-26 (×2): 500 ug via ORAL
  Filled 2017-04-25 (×2): qty 1

## 2017-04-25 MED ORDER — METHYLPREDNISOLONE SODIUM SUCC 125 MG IJ SOLR
60.0000 mg | Freq: Two times a day (BID) | INTRAMUSCULAR | Status: DC
  Administered 2017-04-25: 60 mg via INTRAVENOUS
  Filled 2017-04-25: qty 2

## 2017-04-25 MED ORDER — NITROGLYCERIN 0.4 MG SL SUBL: 0.4000 mg | SUBLINGUAL_TABLET | SUBLINGUAL | Status: DC | PRN

## 2017-04-25 MED ORDER — CARVEDILOL 6.25 MG PO TABS: 3.1250 mg | ORAL_TABLET | Freq: Two times a day (BID) | ORAL | Status: DC

## 2017-04-25 MED ORDER — BECLOMETHASONE DIPROPIONATE 40 MCG/ACT IN AERS: 2.0000 | INHALATION_SPRAY | Freq: Two times a day (BID) | RESPIRATORY_TRACT | Status: DC

## 2017-04-25 MED ORDER — ALBUTEROL SULFATE (2.5 MG/3ML) 0.083% IN NEBU
2.5000 mg | INHALATION_SOLUTION | RESPIRATORY_TRACT | Status: DC | PRN
  Administered 2017-04-25: 2.5 mg via RESPIRATORY_TRACT
  Filled 2017-04-25: qty 3

## 2017-04-25 MED ORDER — SPIRONOLACTONE 25 MG PO TABS
12.5000 mg | ORAL_TABLET | Freq: Every day | ORAL | Status: DC
  Administered 2017-04-26: 12.5 mg via ORAL
  Filled 2017-04-25: qty 1

## 2017-04-25 MED ORDER — MORPHINE SULFATE (PF) 2 MG/ML IV SOLN
1.0000 mg | INTRAVENOUS | Status: DC | PRN
Start: 2017-04-25 — End: 2017-04-26
  Administered 2017-04-25 – 2017-04-26 (×5): 1 mg via INTRAVENOUS
  Filled 2017-04-25 (×5): qty 1

## 2017-04-25 MED ORDER — MOMETASONE FURO-FORMOTEROL FUM 200-5 MCG/ACT IN AERO
2.0000 | INHALATION_SPRAY | Freq: Two times a day (BID) | RESPIRATORY_TRACT | Status: DC
  Administered 2017-04-25 – 2017-04-26 (×3): 2 via RESPIRATORY_TRACT
  Filled 2017-04-25: qty 8.8

## 2017-04-25 MED ORDER — ACETAMINOPHEN 325 MG PO TABS: 650.0000 mg | ORAL_TABLET | ORAL | Status: DC | PRN

## 2017-04-25 MED ORDER — METHYLPREDNISOLONE SODIUM SUCC 125 MG IJ SOLR
60.0000 mg | Freq: Four times a day (QID) | INTRAMUSCULAR | Status: DC
  Administered 2017-04-25: 60 mg via INTRAVENOUS
  Filled 2017-04-25: qty 2

## 2017-04-25 MED ORDER — FUROSEMIDE 40 MG PO TABS
20.0000 mg | ORAL_TABLET | Freq: Two times a day (BID) | ORAL | Status: DC
  Administered 2017-04-25 – 2017-04-26 (×2): 20 mg via ORAL
  Filled 2017-04-25 (×3): qty 1

## 2017-04-25 MED ORDER — TECHNETIUM TC 99M DIETHYLENETRIAME-PENTAACETIC ACID
38.3000 | Freq: Once | INTRAVENOUS | Status: AC | PRN
  Administered 2017-04-25: 38.3 via INTRAVENOUS

## 2017-04-25 MED ORDER — DEXTROMETHORPHAN POLISTIREX ER 30 MG/5ML PO SUER
30.0000 mg | Freq: Two times a day (BID) | ORAL | Status: DC
  Administered 2017-04-25 – 2017-04-26 (×2): 30 mg via ORAL
  Filled 2017-04-25 (×5): qty 5

## 2017-04-25 MED ORDER — LOSARTAN POTASSIUM 25 MG PO TABS
12.5000 mg | ORAL_TABLET | Freq: Every day | ORAL | Status: DC
  Administered 2017-04-26: 12.5 mg via ORAL
  Filled 2017-04-25: qty 1

## 2017-04-25 MED ORDER — ASPIRIN 81 MG PO CHEW
81.0000 mg | CHEWABLE_TABLET | Freq: Every day | ORAL | Status: DC
Start: 2017-04-25 — End: 2017-04-26
  Administered 2017-04-25 – 2017-04-26 (×2): 81 mg via ORAL
  Filled 2017-04-25 (×2): qty 1

## 2017-04-25 MED ORDER — METOPROLOL SUCCINATE ER 50 MG PO TB24
50.0000 mg | ORAL_TABLET | Freq: Every day | ORAL | Status: DC
  Administered 2017-04-26: 50 mg via ORAL
  Filled 2017-04-25: qty 1

## 2017-04-25 MED ORDER — PROMETHAZINE HCL 25 MG/ML IJ SOLN
25.0000 mg | Freq: Four times a day (QID) | INTRAMUSCULAR | Status: DC | PRN
  Administered 2017-04-25 (×3): 25 mg via INTRAVENOUS
  Filled 2017-04-25 (×3): qty 1

## 2017-04-25 MED ORDER — TRAMADOL HCL 50 MG PO TABS
50.0000 mg | ORAL_TABLET | Freq: Two times a day (BID) | ORAL | Status: DC | PRN
  Administered 2017-04-25: 50 mg via ORAL
  Filled 2017-04-25: qty 1

## 2017-04-25 MED ORDER — DM-GUAIFENESIN ER 30-600 MG PO TB12: 1.0000 | ORAL_TABLET | Freq: Two times a day (BID) | ORAL | Status: DC

## 2017-04-25 NOTE — Progress Notes (Signed)
*  PRELIMINARY RESULTS* Echocardiogram 2D Echocardiogram has been performed.  Brittany Nguyen 04/25/2017, 4:14 PM

## 2017-04-25 NOTE — Progress Notes (Signed)
ANTICOAGULATION CONSULT NOTE - Initial Consult  Pharmacy Consult for Lovenox dosing Indication: atrial fibrillation  Allergies  Allergen Reactions  . Contrast Media [Iodinated Diagnostic Agents] Shortness Of Breath  . Latex Hives  . Ondansetron Other (See Comments)    Severe headache  . Penicillins Hives and Other (See Comments)    Has patient had a PCN reaction causing immediate rash, facial/tongue/throat swelling, SOB or lightheadedness with hypotension: Yes Has patient had a PCN reaction causing severe rash involving mucus membranes or skin necrosis: No Has patient had a PCN reaction that required hospitalization No Has patient had a PCN reaction occurring within the last 10 years: No If all of the above answers are "NO", then may proceed with Cephalosporin use.   . Povidone-Iodine Rash  . Pulmicort [Budesonide] Itching    Patient Measurements: Height: 5\' 5"  (165.1 cm) Weight: 232 lb 6.4 oz (105.4 kg) IBW/kg (Calculated) : 57 Enoxaparin Dosing Weight: 105 kg  Vital Signs: Temp: 97.8 F (36.6 C) (08/21 0204) Temp Source: Oral (08/21 0204) BP: 155/118 (08/21 0204) Pulse Rate: 83 (08/21 0204)  Labs:  Recent Labs  04/24/17 2022 04/25/17 0304  HGB 11.6*  --   HCT 34.3*  --   PLT 238  --   CREATININE 0.91  --   TROPONINI <0.03 <0.03    Estimated Creatinine Clearance: 90.2 mL/min (by C-G formula based on SCr of 0.91 mg/dL).   Medical History: Past Medical History:  Diagnosis Date  . Asthma 2013  . CHF (congestive heart failure) ([place])   . Diverticulitis 2010  . DVT (deep venous thrombosis) ([place])   . Endometriosis 1990  . Heart disease 2013  . Hx MRSA infection   . Lump or mass in breast   . Restless leg     Medications:    Assessment:  Goal of Therapy:  Monitor platelets by anticoagulation protocol: Yes   Plan:  Lovenox 1 mg/kg q 12 hours ordered. F/u labs per protocol.  Thanh Pomerleau S 04/25/2017,5:19 AM

## 2017-04-25 NOTE — H&P (Signed)
History and Physical   [facility] - [place] [facility] Admission History and Physical [employer], D.O.    Patient Name: Brittany Nguyen MR#: [id number] Date of Birth: May 10, 1966 Date of Admission: 04/24/2017  Referring MD/NP/PA: Dr. Joni Fears Primary Care Physician: [name], MD  Chief Complaint:  Chief Complaint  Patient presents with  . Chest Pain    HPI: Brittany Nguyen is a 51 y.o. female with a known history of Asthma, diverticulitis status post colectomy, DVT 12/18 on Lovenox for 6 months, chronic systolic heart failure with AICD presents to the emergency department for evaluation of chest pain.  Patient was in a usual state of health until today when she describes the onset of chest tightness which is described as central and radiating to the back and associated with fevers, worsening shortness of breath and nonproductive cough.. No history of atrial fibrillation.  Patient denies fevers/chills,dizziness, N/V/C/D, abdominal pain, dysuria/frequency, changes in mental status.    Patient was last admitted to this hospital on 01/28/2017 with respiratory distress. Otherwise there has been no change in status. Patient has been taking medication as prescribed and there has been no recent change in medication or diet.  No recent antibiotics.  There has been no recent illness, hospitalizations, travel or sick contacts.    EMS/ED Course: Patient received Rocephin and a azithromycin as well as Solu-Medrol, normal saline, Phenergan, nebulizers and Tylenol. Medical admission has been requested for further management of community-acquired pneumonia.  Review of Systems:  CONSTITUTIONAL: Positive fever/chills, fatigue, weakness, negative weight gain/loss, headache. EYES: No blurry or double vision. ENT: No tinnitus, postnasal drip, redness or soreness of the oropharynx. RESPIRATORY: Positive cough, dyspnea, wheeze.  No hemoptysis.  CARDIOVASCULAR: Positive chest pain, negative  palpitations, syncope, orthopnea. No lower extremity edema.  GASTROINTESTINAL: No nausea, vomiting, abdominal pain, diarrhea, constipation.  No hematemesis, melena or hematochezia. GENITOURINARY: No dysuria, frequency, hematuria. ENDOCRINE: No polyuria or nocturia. No heat or cold intolerance. HEMATOLOGY: No anemia, bruising, bleeding. INTEGUMENTARY: No rashes, ulcers, lesions. MUSCULOSKELETAL: No arthritis, gout, dyspnea. NEUROLOGIC: No numbness, tingling, ataxia, seizure-type activity, weakness. PSYCHIATRIC: No anxiety, depression, insomnia.   Past Medical History:  Diagnosis Date  . Asthma 2013  . CHF (congestive heart failure) ([place])   . Diverticulitis 2010  . DVT (deep venous thrombosis) ([place])   . Endometriosis 1990  . Heart disease 2013  . Hx MRSA infection   . Lump or mass in breast   . Restless leg     Past Surgical History:  Procedure Laterality Date  . ABDOMINAL HYSTERECTOMY     age 85  . CARDIAC DEFIBRILLATOR PLACEMENT    . CESAREAN SECTION    . CHOLECYSTECTOMY    . COLECTOMY    . NASAL SINUS SURGERY  2012     reports that she has never smoked. She has never used smokeless tobacco. She reports that she does not drink alcohol or use drugs.  Allergies  Allergen Reactions  . Contrast Media [Iodinated Diagnostic Agents] Shortness Of Breath  . Latex Hives  . Ondansetron Other (See Comments)    Severe headache  . Penicillins Hives and Other (See Comments)    Has patient had a PCN reaction causing immediate rash, facial/tongue/throat swelling, SOB or lightheadedness with hypotension: Yes Has patient had a PCN reaction causing severe rash involving mucus membranes or skin necrosis: No Has patient had a PCN reaction that required hospitalization No Has patient had a PCN reaction occurring within the last 10 years: No If all of the  above answers are "NO", then may proceed with Cephalosporin use.   . Povidone-Iodine Rash  . Pulmicort [Budesonide] Itching    Family  History  Problem Relation Age of Onset  . Cancer Mother 30       ovarian  . Cancer Father 8       brain  . Cancer Daughter 18       skin  . Cancer Maternal Aunt 34       breast    Prior to Admission medications   Medication Sig Start Date End Date Taking? Authorizing Provider  aspirin 81 MG chewable tablet Chew 1 tablet (81 mg total) by mouth daily. 12/09/16  Yes [name], MD  cyanocobalamin 500 MCG tablet Take 500 mcg by mouth daily.   Yes [provider]  Fluticasone-Salmeterol (ADVAIR DISKUS) 500-50 MCG/DOSE AEPB Inhale 1 puff into the lungs 2 (two) times daily. 01/24/17 01/24/18 Yes Sainani, Belia Heman, MD  furosemide (LASIX) 40 MG tablet Take 20 mg by mouth 2 (two) times daily.    Yes [provider]  guaiFENesin (ROBITUSSIN) 100 MG/5ML SOLN Take 5 mLs (100 mg total) by mouth every 4 (four) hours as needed for cough or to loosen phlegm. 01/30/17  Yes [name], MD  losartan (COZAAR) 25 MG tablet Take 12.5 mg by mouth daily.   Yes [provider]  metoprolol succinate (TOPROL-XL) 50 MG 24 hr tablet Take 50 mg by mouth daily. Take with or immediately following a meal.   Yes [provider]  nitroGLYCERIN (NITROSTAT) 0.4 MG SL tablet Place 1 tablet (0.4 mg total) under the tongue every 5 (five) minutes as needed for chest pain. 01/24/17  Yes Sainani, Belia Heman, MD  rOPINIRole (REQUIP) 2 MG tablet Take 1 tablet (2 mg total) by mouth at bedtime. Patient taking differently: Take 2 mg by mouth at bedtime. 2-4  Mg per night 05/14/15  Yes Letitia Neri L, PA-C  spironolactone (ALDACTONE) 25 MG tablet Take 0.5 tablets (12.5 mg total) by mouth daily. 01/31/17  Yes [name], MD  traMADol (ULTRAM) 50 MG tablet Take 50 mg by mouth as needed.   Yes [provider]  beclomethasone (QVAR) 40 MCG/ACT inhaler Inhale 2 puffs into the lungs 2 (two) times daily.    [provider]  carvedilol (COREG) 3.125 MG tablet Take 1 tablet by mouth 2 (two) times daily.  01/24/17   [provider]  lisinopril (PRINIVIL,ZESTRIL) 5 MG tablet Take 5 mg by mouth daily. 01/25/17   [provider]    Physical Exam: Vitals:   04/24/17 1951 04/24/17 1953 04/24/17 2130 04/24/17 2230  BP:  (!) 97/49 (!) 96/59 125/71  Pulse:  86  78  Resp:  20 17 19   Temp:  98.9 F (37.2 C)    TempSrc:  Oral    SpO2:  97%  95%  Weight: 102.1 kg (225 lb)     Height: 5\' 5"  (1.651 m)       GENERAL: 51 y.o.-year-old Female patient, well-developed, well-nourished lying in the bed in no acute distress.  Pleasant and cooperative.   HEENT: Head atraumatic, normocephalic. Pupils equal. Mucus membranes moist. NECK: Supple, full range of motion. No JVD, no bruit heard. No thyroid enlargement, no tenderness, no cervical lymphadenopathy. CHEST: Bibasilar crackles  No use of accessory muscles of respiration.  No reproducible chest wall tenderness.  CARDIOVASCULAR: Irregular. S1, S2 normal. No murmurs, rubs, or gallops. Cap refill <2 seconds. Pulses intact distally.  ABDOMEN: Soft, nondistended, nontender.  No rebound, guarding, rigidity. Normoactive bowel sounds present in all four quadrants.  EXTREMITIES: No pedal edema, cyanosis, or clubbing. No calf tenderness or Homan's sign.  NEUROLOGIC: The patient is alert and oriented x 3. Cranial nerves II through XII are grossly intact with no focal sensorimotor deficit. PSYCHIATRIC:  Normal affect, mood, thought content. SKIN: Warm, dry, and intact without obvious rash, lesion, or ulcer.    Labs on Admission:  CBC:  Recent Labs Lab 04/24/17 2022  WBC 8.7  HGB 11.6*  HCT 34.3*  MCV 75.3*  PLT 564   Basic Metabolic Panel:  Recent Labs Lab 04/24/17 2022  NA 139  K 3.2*  CL 104  CO2 28  GLUCOSE 113*  BUN 10  CREATININE 0.91  CALCIUM 9.4   GFR: Estimated Creatinine Clearance: 88.5 mL/min (by C-G formula based on SCr of 0.91 mg/dL). Liver Function Tests: No results for input(s): AST, ALT, ALKPHOS, BILITOT, PROT,  ALBUMIN in the last 168 hours. No results for input(s): LIPASE, AMYLASE in the last 168 hours. No results for input(s): AMMONIA in the last 168 hours. Coagulation Profile: No results for input(s): INR, PROTIME in the last 168 hours. Cardiac Enzymes:  Recent Labs Lab 04/24/17 2022  TROPONINI <0.03   BNP (last 3 results) No results for input(s): PROBNP in the last 8760 hours. HbA1C: No results for input(s): HGBA1C in the last 72 hours. CBG: No results for input(s): GLUCAP in the last 168 hours. Lipid Profile: No results for input(s): CHOL, HDL, LDLCALC, TRIG, CHOLHDL, LDLDIRECT in the last 72 hours. Thyroid Function Tests: No results for input(s): TSH, T4TOTAL, FREET4, T3FREE, THYROIDAB in the last 72 hours. Anemia Panel: No results for input(s): VITAMINB12, FOLATE, FERRITIN, TIBC, IRON, RETICCTPCT in the last 72 hours. Urine analysis:    Component Value Date/Time   COLORURINE STRAW (A) 02/01/2016 1953   APPEARANCEUR CLEAR (A) 02/01/2016 1953   APPEARANCEUR Clear 09/02/2014 2312   LABSPEC 1.006 02/01/2016 1953   LABSPEC 1.013 09/02/2014 2312   PHURINE 7.0 02/01/2016 1953   GLUCOSEU NEGATIVE 02/01/2016 1953   GLUCOSEU Negative 09/02/2014 2312   HGBUR NEGATIVE 02/01/2016 1953   BILIRUBINUR NEGATIVE 02/01/2016 1953   BILIRUBINUR Negative 09/02/2014 2312   [place] 02/01/2016 1953   PROTEINUR NEGATIVE 02/01/2016 1953   NITRITE NEGATIVE 02/01/2016 1953   LEUKOCYTESUR NEGATIVE 02/01/2016 1953   LEUKOCYTESUR Negative 09/02/2014 2312   Sepsis Labs: @LABRCNTIP (procalcitonin:4,lacticidven:4) )No results found for this or any previous visit (from the past 240 hour(s)).   Radiological Exams on Admission: Dg Chest 2 View  Result Date: 04/24/2017 CLINICAL DATA:  Mid upper chest pain.  Nonproductive cough. EXAM: CHEST  2 VIEW COMPARISON:  Jan 30, 2017 FINDINGS: An AICD device has been placed in the interval with leads projected over the right age been right ventricle. No  pneumothorax. The cardiomediastinal silhouette is stable. Mild haziness over lateral right lung base is identified. No other abnormalities or changes. IMPRESSION: 1. Mild haziness over the right lung base not seen previously may represent atelectasis versus an early infiltrate. Recommend follow-up as clinically warranted. No other abnormalities. Electronically Signed   By: Dorise Bullion III M.D   On: 04/24/2017 20:12    EKG: Atrial fibrillation at 129 bpm with leftward axis and nonspecific ST-T wave changes.   Assessment/Plan  This is a 51 y.o. female with a history of Asthma, diverticulitis status post colectomy, DVT, chronic systolic heart failure with AICD now being admitted with:  #. Community Acquired Pneumonia in a patient with  asthma/COPD - Admit to inpatient - IV Rocephin & Azithromycin per pharmacy - IV steroids - Gentle IV fluid hydration - Duonebs, expectorants & O2 therapy as needed - Follow up blood & sputum cultures -We'll check d-dimer and CTA if positive given history of DVT  #. Atrial fibrillation with RVR, new onset.  - Monitor on tele -Continue Coreg, metoprolol - Check echo, TSH, troponins and lipids - CHADSVASC2 is 5, will start weight based Lovenox  - Cardio consultation has been requested.   #. Hypokalemia - Replace PO  - Check mag level - Recheck BMP in AM  #. History of hypertension and chronic systolic heart failure - Continue Coreg, losartan, metoprolol, Lasix, spironolactone, nitroglycerin, aspirin - Check intake and output and daily weight  #. History of asthma/COPD - Continue beclomethasone, Advair  Admission status: Inpatient telemetry IV Fluids: Normal saline Diet/Nutrition: Nothing by mouth after midnight Consults called: Cardiology  DVT Px: Lovenox, SCDs and early ambulation. Code Status: Full Code  Disposition Plan: To home in 1-2 days  All the records are reviewed and case discussed with ED provider. Management plans discussed with  the patient and/or family who express understanding and agree with plan of care.  Shaila Gilchrest D.O. on 04/25/2017 at 12:55 AM Between 7am to 6pm - Pager - [phone] After 6pm go to [email or web address] - [employer] [facility] [place] [facility] Office [phone] CC: Primary care physician; [name], MD   04/25/2017, 12:55 AM

## 2017-04-25 NOTE — Progress Notes (Signed)
Patient briefly seen and examined this morning.  Patient admitted this morning for community acquired pneumonia and asthma exacerbation Agree with Dr. Geraldine Solar A/P.  Follow up VQ, echo and cards consult Added Phenergan for nausea

## 2017-04-25 NOTE — Plan of Care (Signed)
Problem: Tissue Perfusion: Goal: Risk factors for ineffective tissue perfusion will decrease Outcome: Progressing lovenox for VTE  Problem: Pain Management: Goal: Expressions of feelings of enhanced comfort will increase Outcome: Not Progressing Pt still with complaints of pain throughout the night after being admitted, treated with tramadol & morphine with just some relief. Will continue to monitor.

## 2017-04-25 NOTE — Progress Notes (Signed)
Patient unable to tolerate VQ scan. Back in room, short of breath. Dr. Benjie Karvonen notified. RT giving breathing treatment now.

## 2017-04-25 NOTE — Progress Notes (Addendum)
ANTIBIOTIC CONSULT NOTE - INITIAL  Pharmacy Consult for ceftriaxone and azithromycin  Indication: CAP   Allergies  Allergen Reactions  . Contrast Media [Iodinated Diagnostic Agents] Shortness Of Breath  . Latex Hives  . Ondansetron Other (See Comments)    Severe headache  . Penicillins Hives and Other (See Comments)    Has patient had a PCN reaction causing immediate rash, facial/tongue/throat swelling, SOB or lightheadedness with hypotension: Yes Has patient had a PCN reaction causing severe rash involving mucus membranes or skin necrosis: No Has patient had a PCN reaction that required hospitalization No Has patient had a PCN reaction occurring within the last 10 years: No If all of the above answers are "NO", then may proceed with Cephalosporin use.   . Povidone-Iodine Rash  . Pulmicort [Budesonide] Itching    Patient Measurements: Height: 5\' 5"  (165.1 cm) Weight: 232 lb 6.4 oz (105.4 kg) IBW/kg (Calculated) : 57 Vital Signs: Temp: 98.1 F (36.7 C) (08/21 1441) Temp Source: Oral (08/21 1441) BP: 120/69 (08/21 1441) Pulse Rate: 43 (08/21 1441) Intake/Output from previous day: 08/20 0701 - 08/21 0700 In: 669.2 [P.O.:240; I.V.:129.2; IV Piggyback:300] Out: 0  Intake/Output from this shift: Total I/O In: 480 [P.O.:480] Out: -   Labs:  Recent Labs  04/24/17 2022 04/25/17 0722  WBC 8.7 6.8  HGB 11.6* 11.1*  PLT 238 220  CREATININE 0.91 0.90   Estimated Creatinine Clearance: 91.2 mL/min (by C-G formula based on SCr of 0.9 mg/dL). No results for input(s): VANCOTROUGH, VANCOPEAK, VANCORANDOM, GENTTROUGH, GENTPEAK, GENTRANDOM, TOBRATROUGH, TOBRAPEAK, TOBRARND, AMIKACINPEAK, AMIKACINTROU, AMIKACIN in the last 72 hours.   Microbiology: No results found for this or any previous visit (from the past 720 hour(s)).  Medical History: Past Medical History:  Diagnosis Date  . Asthma 2013  . CHF (congestive heart failure) ([place])   . Diverticulitis 2010  . DVT (deep  venous thrombosis) ([place])   . Endometriosis 1990  . Heart disease 2013  . Hx MRSA infection   . Lump or mass in breast   . Restless leg    Assessment: 51 year old female presented to ED with chest pain and we are now treating for CAP. Pharmacy consulted for azithromycin and ceftriaxone.   Sputum Cx: Sent   Plan:  Will continue ceftriaxone 1g IV q24h  Will continue azithromycin 500mg  IV q24h   [place] Resident

## 2017-04-25 NOTE — Consult Note (Signed)
Brittany Nguyen  CARDIOLOGY CONSULT NOTE  Patient ID: Brittany Nguyen MRN: [id number] DOB/AGE: 1966/07/04 51 y.o.  Admit date: 04/24/2017 Referring Physician Dr. Benjie Karvonen Primary Physician   Primary Cardiologist [facility] Reason for Consultation chf  HPI: Pt is a 51 yo female with history of chronic nonischemic systolic heart failure  With aicd in place,  [facility]ed [facility] treated with losartan, spironolactone and metoprolol with histoyr of osa, history of dvt admitted with cough and sob. CXR revealed haziness in the right lung base which duggest early infiltrate with no high grade chf. She reports she gained 8-10 pounds prior to admission over her baseline despite compliance with her meds. She was not able to be compliant with v/q scan today. She has ruled out for an mi. EKG revealed nsr with no ischemia. She is somewhat improved since admission but still has a cough which has been non production.   Review of Systems  HENT: Negative.   Eyes: Negative.   Respiratory: Positive for cough and shortness of breath.   Cardiovascular: Negative.   Gastrointestinal: Negative.   Genitourinary: Negative.   Musculoskeletal: Negative.   Skin: Negative.   Neurological: Positive for weakness.  Endo/Heme/Allergies: Negative.   Psychiatric/Behavioral: Negative.     Past Medical History:  Diagnosis Date  . Asthma 2013  . CHF (congestive heart failure) ([place])   . Diverticulitis 2010  . DVT (deep venous thrombosis) ([place])   . Endometriosis 1990  . Heart disease 2013  . Hx MRSA infection   . Lump or mass in breast   . Restless leg     Family History  Problem Relation Age of Onset  . Cancer Mother 30       ovarian  . Cancer Father 20       brain  . Cancer Daughter 18       skin  . Cancer Maternal Aunt 27       breast    Social History   Social History  . Marital status: Married    Spouse name: N/A  . Number of children: N/A  . Years of  education: N/A   Occupational History  . works at [employer].    Social History Main Topics  . Smoking status: Never Smoker  . Smokeless tobacco: Never Used  . Alcohol use No  . Drug use: No  . Sexual activity: Not on file   Other Topics Concern  . Not on file   Social History Narrative  . No narrative on file    Past Surgical History:  Procedure Laterality Date  . ABDOMINAL HYSTERECTOMY     age 64  . CARDIAC DEFIBRILLATOR PLACEMENT    . CESAREAN SECTION    . CHOLECYSTECTOMY    . COLECTOMY    . NASAL SINUS SURGERY  2012     Prescriptions Prior to Admission  Medication Sig Dispense Refill Last Dose  . aspirin 81 MG chewable tablet Chew 1 tablet (81 mg total) by mouth daily. 30 tablet 0 Taking  . cyanocobalamin 500 MCG tablet Take 500 mcg by mouth daily.   Taking  . Fluticasone-Salmeterol (ADVAIR DISKUS) 500-50 MCG/DOSE AEPB Inhale 1 puff into the lungs 2 (two) times daily. 1 each 0 Taking  . furosemide (LASIX) 40 MG tablet Take 20 mg by mouth 2 (two) times daily.    Taking  . guaiFENesin (ROBITUSSIN) 100 MG/5ML SOLN Take 5 mLs (100 mg total) by mouth every 4 (  four) hours as needed for cough or to loosen phlegm. 118 mL 0 prn  . losartan (COZAAR) 25 MG tablet Take 12.5 mg by mouth daily.   Taking  . metoprolol succinate (TOPROL-XL) 50 MG 24 hr tablet Take 50 mg by mouth daily. Take with or immediately following a meal.   Taking  . nitroGLYCERIN (NITROSTAT) 0.4 MG SL tablet Place 1 tablet (0.4 mg total) under the tongue every 5 (five) minutes as needed for chest pain. 90 tablet 1 prn  . rOPINIRole (REQUIP) 2 MG tablet Take 1 tablet (2 mg total) by mouth at bedtime. (Patient taking differently: Take 2 mg by mouth at bedtime. 2-4  Mg per night) 30 tablet 0 Taking  . spironolactone (ALDACTONE) 25 MG tablet Take 0.5 tablets (12.5 mg total) by mouth daily. 30 tablet 0 Taking  . traMADol (ULTRAM) 50 MG tablet Take 50 mg by mouth as needed.   Taking  . beclomethasone (QVAR) 40 MCG/ACT  inhaler Inhale 2 puffs into the lungs 2 (two) times daily.   Not Taking at Unknown time  . carvedilol (COREG) 3.125 MG tablet Take 1 tablet by mouth 2 (two) times daily.   Not Taking at Unknown time  . lisinopril (PRINIVIL,ZESTRIL) 5 MG tablet Take 5 mg by mouth daily.   Not Taking at Unknown time    Physical Exam: Blood pressure (!) 109/59, pulse (!) 118, temperature 98.8 F (37.1 C), temperature source Oral, resp. rate 18, height 5\' 5"  (1.651 m), weight 105.4 kg (232 lb 6.4 oz), last menstrual period 01/22/2012, SpO2 96 %.   Wt Readings from Last 1 Encounters:  04/25/17 105.4 kg (232 lb 6.4 oz)     General appearance: alert and cooperative Head: Normocephalic, without obvious abnormality, atraumatic Resp: clear to auscultation bilaterally Cardio: regular rate and rhythm GI: soft, non-tender; bowel sounds normal; no masses,  no organomegaly Extremities: extremities normal, atraumatic, no cyanosis or edema Neurologic: Grossly normal  Labs:   Lab Results  Component Value Date   WBC 6.8 04/25/2017   HGB 11.1 (L) 04/25/2017   HCT 33.6 (L) 04/25/2017   MCV 77.7 (L) 04/25/2017   PLT 220 04/25/2017    Recent Labs Lab 04/25/17 0722  NA 139  K 4.0  CL 107  CO2 24  BUN 9  CREATININE 0.90  CALCIUM 9.2  GLUCOSE 215*   Lab Results  Component Value Date   CKTOTAL 70 09/02/2014   CKMB < 0.5 (L) 09/03/2014   TROPONINI <0.03 04/25/2017      Radiology: possible infiltrate. No obvious chf EKG: nsr  ASSESSMENT AND PLAN:  Pt with history of nonischemic systollic cardiomyopathy with aicd in place treated with spironolactone, beta blockers, losartan. She has improved with abx and diuresis. She is conitnueing on losartan, spironolactone. Has aicd in place that is being followed and is funcitonal. Does not apperat to be in chf at present. Would continue with abx and current meds. Will follow with you.   Signed: Teodoro Spray MD, [facility] 04/25/2017, 9:16 PM

## 2017-04-26 DIAGNOSIS — J449 Chronic obstructive pulmonary disease, unspecified: Secondary | ICD-10-CM | POA: Diagnosis present

## 2017-04-26 LAB — BASIC METABOLIC PANEL
Anion gap: 6 (ref 5–15)
BUN: 14 mg/dL (ref 6–20)
CALCIUM: 9 mg/dL (ref 8.9–10.3)
CO2: 27 mmol/L (ref 22–32)
CREATININE: 0.79 mg/dL (ref 0.44–1.00)
Chloride: 108 mmol/L (ref 101–111)
GFR calc Af Amer: >60 mL/min (ref >60)
GLUCOSE: 181 mg/dL — AB (ref 65–99)
Potassium: 4.6 mmol/L (ref 3.5–5.1)
Sodium: 141 mmol/L (ref 135–145)

## 2017-04-26 LAB — CBC
HCT: 32.5 % — ABNORMAL LOW (ref 35.0–47.0)
Hemoglobin: 10.5 g/dL — ABNORMAL LOW (ref 12.0–16.0)
MCH: 25.2 pg — AB (ref 26.0–34.0)
MCHC: 32.4 g/dL (ref 32.0–36.0)
MCV: 77.8 fL — ABNORMAL LOW (ref 80.0–100.0)
PLATELETS: 223 10*3/uL (ref 150–440)
RBC: 4.18 MIL/uL (ref 3.80–5.20)
RDW: 17.8 % — AB (ref 11.5–14.5)
WBC: 16.7 10*3/uL — ABNORMAL HIGH (ref 3.6–11.0)

## 2017-04-26 MED ORDER — PROMETHAZINE HCL 12.5 MG PO TABS: 12.5000 mg | ORAL_TABLET | Freq: Four times a day (QID) | ORAL | 0 refills | Status: DC | PRN

## 2017-04-26 MED ORDER — ROPINIROLE HCL 2 MG PO TABS: 2.0000 mg | ORAL_TABLET | Freq: Every day | ORAL | Status: DC

## 2017-04-26 MED ORDER — HYDROCOD POLST-CPM POLST ER 10-8 MG/5ML PO SUER: 5.0000 mL | Freq: Two times a day (BID) | ORAL | 0 refills | Status: DC | PRN

## 2017-04-26 MED ORDER — PREDNISONE 20 MG PO TABS: 40.0000 mg | ORAL_TABLET | Freq: Every day | ORAL | 0 refills | Status: AC

## 2017-04-26 MED ORDER — METHYLPREDNISOLONE SODIUM SUCC 40 MG IJ SOLR
40.0000 mg | Freq: Every day | INTRAMUSCULAR | Status: DC
  Administered 2017-04-26: 40 mg via INTRAVENOUS
  Filled 2017-04-26: qty 1

## 2017-04-26 MED ORDER — LEVOFLOXACIN 750 MG PO TABS: 750.0000 mg | ORAL_TABLET | Freq: Every day | ORAL | 0 refills | Status: DC

## 2017-04-26 NOTE — Progress Notes (Signed)
[place] for ceftriaxone and azithromycin  Indication: CAP   Allergies  Allergen Reactions  . Contrast Media [Iodinated Diagnostic Agents] Shortness Of Breath  . Latex Hives  . Ondansetron Other (See Comments)    Severe headache  . Penicillins Hives and Other (See Comments)    Has patient had a PCN reaction causing immediate rash, facial/tongue/throat swelling, SOB or lightheadedness with hypotension: Yes Has patient had a PCN reaction causing severe rash involving mucus membranes or skin necrosis: No Has patient had a PCN reaction that required hospitalization No Has patient had a PCN reaction occurring within the last 10 years: No If all of the above answers are "NO", then may proceed with Cephalosporin use.   . Povidone-Iodine Rash  . Pulmicort [Budesonide] Itching    Patient Measurements: Height: 5\' 5"  (165.1 cm) Weight: 232 lb 14.4 oz (105.6 kg) IBW/kg (Calculated) : 57 Vital Signs: Temp: 98.4 F (36.9 C) (08/22 0350) Temp Source: Oral (08/22 0350) BP: 106/68 (08/22 0755) Pulse Rate: 110 (08/22 0755) Intake/Output from previous day: 08/21 0701 - 08/22 0700 In: 2183.3 [P.O.:840; I.V.:1043.3; IV Piggyback:300] Out: 2300 [Urine:2300] Intake/Output from this shift: Total I/O In: 240 [P.O.:240] Out: -   Labs:  Recent Labs  04/24/17 2022 04/25/17 0722 04/26/17 0536  WBC 8.7 6.8 16.7*  HGB 11.6* 11.1* 10.5*  PLT 238 220 223  CREATININE 0.91 0.90 0.79   Estimated Creatinine Clearance: 102.6 mL/min (by C-G formula based on SCr of 0.79 mg/dL). No results for input(s): VANCOTROUGH, VANCOPEAK, VANCORANDOM, GENTTROUGH, GENTPEAK, GENTRANDOM, TOBRATROUGH, TOBRAPEAK, TOBRARND, AMIKACINPEAK, AMIKACINTROU, AMIKACIN in the last 72 hours.   Microbiology: No results found for this or any previous visit (from the past 720 hour(s)).  Medical History: Past Medical History:  Diagnosis Date  . Asthma 2013  . CHF (congestive heart failure)  ([place])   . Diverticulitis 2010  . DVT (deep venous thrombosis) ([place])   . Endometriosis 1990  . Heart disease 2013  . Hx MRSA infection   . Lump or mass in breast   . Restless leg    Assessment: 51 year old female presented to ED with chest pain and we are now treating for CAP. Pharmacy consulted for azithromycin and ceftriaxone. Patient to be discharged today.    Sputum Cx: Sent   Plan:  Will continue ceftriaxone 1g IV q24h and azithromycin 500mg  IV q24h.  Per discharge summary, will be discharged on Levaquin 750 mg PO daily x 5 days.    [place] Resident

## 2017-04-26 NOTE — Progress Notes (Signed)
Discharge instructions explained to pt/ verbalized an understanding/ iv and tele removed/ RX given to pt/ will transport off unit via wheelchair.  

## 2017-04-26 NOTE — Care Management (Signed)
Patient's discharge prescriptions are to [facility] to [facility]

## 2017-04-26 NOTE — Discharge Summary (Addendum)
[facility] NAME: Brittany Nguyen    MR#:  [id number]  DATE OF BIRTH:  1965-12-12  DATE OF ADMISSION:  04/24/2017 ADMITTING PHYSICIAN: Harvie Bridge, DO  DATE OF DISCHARGE: 04/26/2017  PRIMARY CARE PHYSICIAN: [name], MD    ADMISSION DIAGNOSIS:  COPD exacerbation ([place]) [J44.1] Atrial fibrillation with rapid ventricular response ([place]) [I48.91] Community acquired pneumonia of right lower lobe of lung ([place]) [J18.1]  DISCHARGE DIAGNOSIS:  Active Problems:   Community acquired pneumonia   COPD (chronic obstructive pulmonary disease) ([place])   SECONDARY DIAGNOSIS:   Past Medical History:  Diagnosis Date  . Asthma 2013  . CHF (congestive heart failure) ([place])   . Diverticulitis 2010  . DVT (deep venous thrombosis) ([place])   . Endometriosis 1990  . Heart disease 2013  . Hx MRSA infection   . Lump or mass in breast   . Restless leg     HOSPITAL COURSE:    51 year old female with history of chronic nonischemic cardiomyopathy EF 30-35%, asthma and chronic systolic heart failure who presents with cough and shortness of breath and found to have community acquired pneumonia.  1. Community-acquired pneumonia without hypoxia: Patient was treated with IV Rocephin and azithromycin. She was transitioned to oral Levaquin at discharge. Her symptoms have improved. She had no fever while in the hospital.  2. Chronic nonischemic cardiomyopathyStatus post AICD: Patient follow-up at [facility] She will continue Coreg, metoprolol  3. Atrial fibrillation: This was short lived and patient is now normal sinus rhythm this was due to pneumonia. Echocardiogram showed no major valvular abnormalities, EF is 30-35% which is her baseline.she was evaluated by cardiology team here.  4. Hypokalemia: This was repleted 5. Acute asthma exacerbation: She has no wheezing on examination. She will be discharged with 3 days prednisone. She will continue inhalers. She  will follow-up with her pulmonologist Dr. Raul Del in 2-4 weeks.    DISCHARGE CONDITIONS AND DIET:   Stable for discharge on cardiac diet  CONSULTS OBTAINED:  Treatment Team:  Teodoro Spray, MD  DRUG ALLERGIES:   Allergies  Allergen Reactions  . Contrast Media [Iodinated Diagnostic Agents] Shortness Of Breath  . Latex Hives  . Ondansetron Other (See Comments)    Severe headache  . Penicillins Hives and Other (See Comments)    Has patient had a PCN reaction causing immediate rash, facial/tongue/throat swelling, SOB or lightheadedness with hypotension: Yes Has patient had a PCN reaction causing severe rash involving mucus membranes or skin necrosis: No Has patient had a PCN reaction that required hospitalization No Has patient had a PCN reaction occurring within the last 10 years: No If all of the above answers are "NO", then may proceed with Cephalosporin use.   . Povidone-Iodine Rash  . Pulmicort [Budesonide] Itching    DISCHARGE MEDICATIONS:   Current Discharge Medication List    START taking these medications   Details  chlorpheniramine-HYDROcodone (TUSSIONEX PENNKINETIC ER) 10-8 MG/5ML SUER Take 5 mLs by mouth every 12 (twelve) hours as needed for cough. Qty: 115 mL, Refills: 0    levofloxacin (LEVAQUIN) 750 MG tablet Take 1 tablet (750 mg total) by mouth daily. Qty: 5 tablet, Refills: 0    predniSONE (DELTASONE) 20 MG tablet Take 2 tablets (40 mg total) by mouth daily with breakfast. Qty: 6 tablet, Refills: 0    promethazine (PHENERGAN) 12.5 MG tablet Take 1 tablet (12.5 mg total) by mouth every 6 (six) hours as needed for nausea or  vomiting. Qty: 30 tablet, Refills: 0      CONTINUE these medications which have CHANGED   Details  rOPINIRole (REQUIP) 2 MG tablet Take 1 tablet (2 mg total) by mouth at bedtime. 2-4  Mg per night      CONTINUE these medications which have NOT CHANGED   Details  aspirin 81 MG chewable tablet Chew 1 tablet (81 mg total) by  mouth daily. Qty: 30 tablet, Refills: 0    cyanocobalamin 500 MCG tablet Take 500 mcg by mouth daily.    Fluticasone-Salmeterol (ADVAIR DISKUS) 500-50 MCG/DOSE AEPB Inhale 1 puff into the lungs 2 (two) times daily. Qty: 1 each, Refills: 0    furosemide (LASIX) 40 MG tablet Take 20 mg by mouth 2 (two) times daily.     guaiFENesin (ROBITUSSIN) 100 MG/5ML SOLN Take 5 mLs (100 mg total) by mouth every 4 (four) hours as needed for cough or to loosen phlegm. Qty: 118 mL, Refills: 0    losartan (COZAAR) 25 MG tablet Take 12.5 mg by mouth daily.    metoprolol succinate (TOPROL-XL) 50 MG 24 hr tablet Take 50 mg by mouth daily. Take with or immediately following a meal.    nitroGLYCERIN (NITROSTAT) 0.4 MG SL tablet Place 1 tablet (0.4 mg total) under the tongue every 5 (five) minutes as needed for chest pain. Qty: 90 tablet, Refills: 1    spironolactone (ALDACTONE) 25 MG tablet Take 0.5 tablets (12.5 mg total) by mouth daily. Qty: 30 tablet, Refills: 0    traMADol (ULTRAM) 50 MG tablet Take 50 mg by mouth as needed.    beclomethasone (QVAR) 40 MCG/ACT inhaler Inhale 2 puffs into the lungs 2 (two) times daily.      STOP taking these medications     carvedilol (COREG) 3.125 MG tablet      lisinopril (PRINIVIL,ZESTRIL) 5 MG tablet           Today   CHIEF COMPLAINT:   Patient doing better this morning. Still has a cough and shortness of breath and wheezing improved.   VITAL SIGNS:  Blood pressure 106/68, pulse (!) 110, temperature 98.4 F (36.9 C), temperature source Oral, resp. rate 18, height 5\' 5"  (1.651 m), weight 105.6 kg (232 lb 14.4 oz), last menstrual period 01/22/2012, SpO2 95 %.   REVIEW OF SYSTEMS:  Review of Systems  Constitutional: Negative.  Negative for chills, fever and malaise/fatigue.  HENT: Negative.  Negative for ear discharge, ear pain, hearing loss, nosebleeds and sore throat.   Eyes: Negative.  Negative for blurred vision and pain.  Respiratory:  Positive for cough. Negative for hemoptysis, shortness of breath and wheezing.   Cardiovascular: Negative.  Negative for chest pain, palpitations and leg swelling.  Gastrointestinal: Negative.  Negative for abdominal pain, blood in stool, diarrhea, nausea and vomiting.  Genitourinary: Negative.  Negative for dysuria.  Musculoskeletal: Negative.  Negative for back pain.  Skin: Negative.   Neurological: Negative for dizziness, tremors, speech change, focal weakness, seizures and headaches.  Endo/Heme/Allergies: Negative.  Does not bruise/bleed easily.  Psychiatric/Behavioral: Negative.  Negative for depression, hallucinations and suicidal ideas.     PHYSICAL EXAMINATION:  GENERAL:  51 y.o.-year-old patient lying in the bed with no acute distress.  NECK:  Supple, no jugular venous distention. No thyroid enlargement, no tenderness.  LUNGS: Normal breath sounds bilaterally, no wheezing, rales,rhonchi  No use of accessory muscles of respiration.  CARDIOVASCULAR: S1, S2 normal. No murmurs, rubs, or gallops.  ABDOMEN: Soft, non-tender, non-distended. Bowel sounds present. No  organomegaly or mass.  EXTREMITIES: No pedal edema, cyanosis, or clubbing.  PSYCHIATRIC: The patient is alert and oriented x 3.  SKIN: No obvious rash, lesion, or ulcer.   DATA REVIEW:   CBC  Recent Labs Lab 04/26/17 0536  WBC 16.7*  HGB 10.5*  HCT 32.5*  PLT 223    Chemistries   Recent Labs Lab 04/25/17 0304  04/26/17 0536  NA  --   < > 141  K  --   < > 4.6  CL  --   < > 108  CO2  --   < > 27  GLUCOSE  --   < > 181*  BUN  --   < > 14  CREATININE  --   < > 0.79  CALCIUM  --   < > 9.0  MG 1.9  --   --   < > = values in this interval not displayed.  Cardiac Enzymes  Recent Labs Lab 04/25/17 0304 04/25/17 0722 04/25/17 1346  TROPONINI <0.03 <0.03 <0.03    Microbiology Results  @MICRORSLT48 @  RADIOLOGY:  Dg Chest 2 View  Result Date: 04/24/2017 CLINICAL DATA:  Mid upper chest pain.   Nonproductive cough. EXAM: CHEST  2 VIEW COMPARISON:  Jan 30, 2017 FINDINGS: An AICD device has been placed in the interval with leads projected over the right age been right ventricle. No pneumothorax. The cardiomediastinal silhouette is stable. Mild haziness over lateral right lung base is identified. No other abnormalities or changes. IMPRESSION: 1. Mild haziness over the right lung base not seen previously may represent atelectasis versus an early infiltrate. Recommend follow-up as clinically warranted. No other abnormalities. Electronically Signed   By: Dorise Bullion III M.D   On: 04/24/2017 20:12   Nm Pulmonary Perf And Vent  Result Date: 04/25/2017 CLINICAL DATA:  Pulmonary embolism suspected.  Positive D-dimer. EXAM: NUCLEAR MEDICINE VENTILATION - PERFUSION LUNG SCAN TECHNIQUE: Patient was not able to complete the ventilation portion of the examination due to coughing and wheezing. Perfusion images were not attempted. RADIOPHARMACEUTICALS:  38.3 mCi Technetium-66m DTPA aerosol inhalation COMPARISON:  04/24/2017 FINDINGS: No images obtained. IMPRESSION: Study was not completed.  Patient could not tolerate the procedure. Electronically Signed   By: Markus Daft M.D.   On: 04/25/2017 13:59      Current Discharge Medication List    START taking these medications   Details  chlorpheniramine-HYDROcodone (TUSSIONEX PENNKINETIC ER) 10-8 MG/5ML SUER Take 5 mLs by mouth every 12 (twelve) hours as needed for cough. Qty: 115 mL, Refills: 0    levofloxacin (LEVAQUIN) 750 MG tablet Take 1 tablet (750 mg total) by mouth daily. Qty: 5 tablet, Refills: 0    predniSONE (DELTASONE) 20 MG tablet Take 2 tablets (40 mg total) by mouth daily with breakfast. Qty: 6 tablet, Refills: 0    promethazine (PHENERGAN) 12.5 MG tablet Take 1 tablet (12.5 mg total) by mouth every 6 (six) hours as needed for nausea or vomiting. Qty: 30 tablet, Refills: 0      CONTINUE these medications which have CHANGED   Details   rOPINIRole (REQUIP) 2 MG tablet Take 1 tablet (2 mg total) by mouth at bedtime. 2-4  Mg per night      CONTINUE these medications which have NOT CHANGED   Details  aspirin 81 MG chewable tablet Chew 1 tablet (81 mg total) by mouth daily. Qty: 30 tablet, Refills: 0    cyanocobalamin 500 MCG tablet Take 500 mcg by mouth daily.  Fluticasone-Salmeterol (ADVAIR DISKUS) 500-50 MCG/DOSE AEPB Inhale 1 puff into the lungs 2 (two) times daily. Qty: 1 each, Refills: 0    furosemide (LASIX) 40 MG tablet Take 20 mg by mouth 2 (two) times daily.     guaiFENesin (ROBITUSSIN) 100 MG/5ML SOLN Take 5 mLs (100 mg total) by mouth every 4 (four) hours as needed for cough or to loosen phlegm. Qty: 118 mL, Refills: 0    losartan (COZAAR) 25 MG tablet Take 12.5 mg by mouth daily.    metoprolol succinate (TOPROL-XL) 50 MG 24 hr tablet Take 50 mg by mouth daily. Take with or immediately following a meal.    nitroGLYCERIN (NITROSTAT) 0.4 MG SL tablet Place 1 tablet (0.4 mg total) under the tongue every 5 (five) minutes as needed for chest pain. Qty: 90 tablet, Refills: 1    spironolactone (ALDACTONE) 25 MG tablet Take 0.5 tablets (12.5 mg total) by mouth daily. Qty: 30 tablet, Refills: 0    traMADol (ULTRAM) 50 MG tablet Take 50 mg by mouth as needed.    beclomethasone (QVAR) 40 MCG/ACT inhaler Inhale 2 puffs into the lungs 2 (two) times daily.      STOP taking these medications     carvedilol (COREG) 3.125 MG tablet      lisinopril (PRINIVIL,ZESTRIL) 5 MG tablet           Management plans discussed with the patient and she is in agreement. Stable for discharge home  Patient should follow up with pcp  CODE STATUS:     Code Status Orders        Start     Ordered   04/25/17 0147  Full code  Continuous     04/25/17 0146    Code Status History    Date Active Date Inactive Code Status Order ID Comments User Context   01/28/2017 10:33 AM 01/30/2017  5:41 PM Full Code [id number]  [name], MD Inpatient   01/22/2017  1:14 AM 01/24/2017  5:59 PM Full Code [id number]  Lance Coon, MD Inpatient   12/09/2016  2:09 AM 12/09/2016  3:07 PM Full Code [id number]  Lance Coon, MD Inpatient   11/21/2015  6:50 AM 11/22/2015  3:50 PM Full Code [id number]  Saundra Shelling, MD Inpatient   09/19/2015  5:11 AM 09/21/2015  6:29 PM Full Code [id number]  Harrie Foreman, MD Inpatient      TOTAL TIME TAKING CARE OF THIS PATIENT: 37 minutes.    Note: This dictation was prepared with Dragon dictation along with smaller phrase technology. Any transcriptional errors that result from this process are unintentional.  Kree Rafter M.D on 04/26/2017 at 11:17 AM  Between 7am to 6pm - Pager - [phone] After 6pm go to [email or web address] - password [email or web address] [facility]  Office  [phone]  CC: Primary care physician; [name], MD

## 2017-04-26 NOTE — Progress Notes (Signed)
[facility] [place] was admitted to the Hospital on 04/24/2017 and Discharged  04/26/2017 and should be excused from work/school   for 5days starting 04/24/2017 , may return to work/school without any restrictions.  Call Bettey Costa MD with questions.  Demarkus Remmel M.D on 04/26/2017,at 8:02 AM  [place] at [facility]  [phone]

## 2017-04-27 IMAGING — DX DG FOREARM 2V*R*
2 series · 2 of 2 positions shown · non-contrast
Comparison: No priors.

CLINICAL DATA: 49-year-old female with history of trauma after
running into each door at work complaining of pain in the right
forearm.

EXAM:
RIGHT FOREARM - 2 VIEW

[forearm ap]
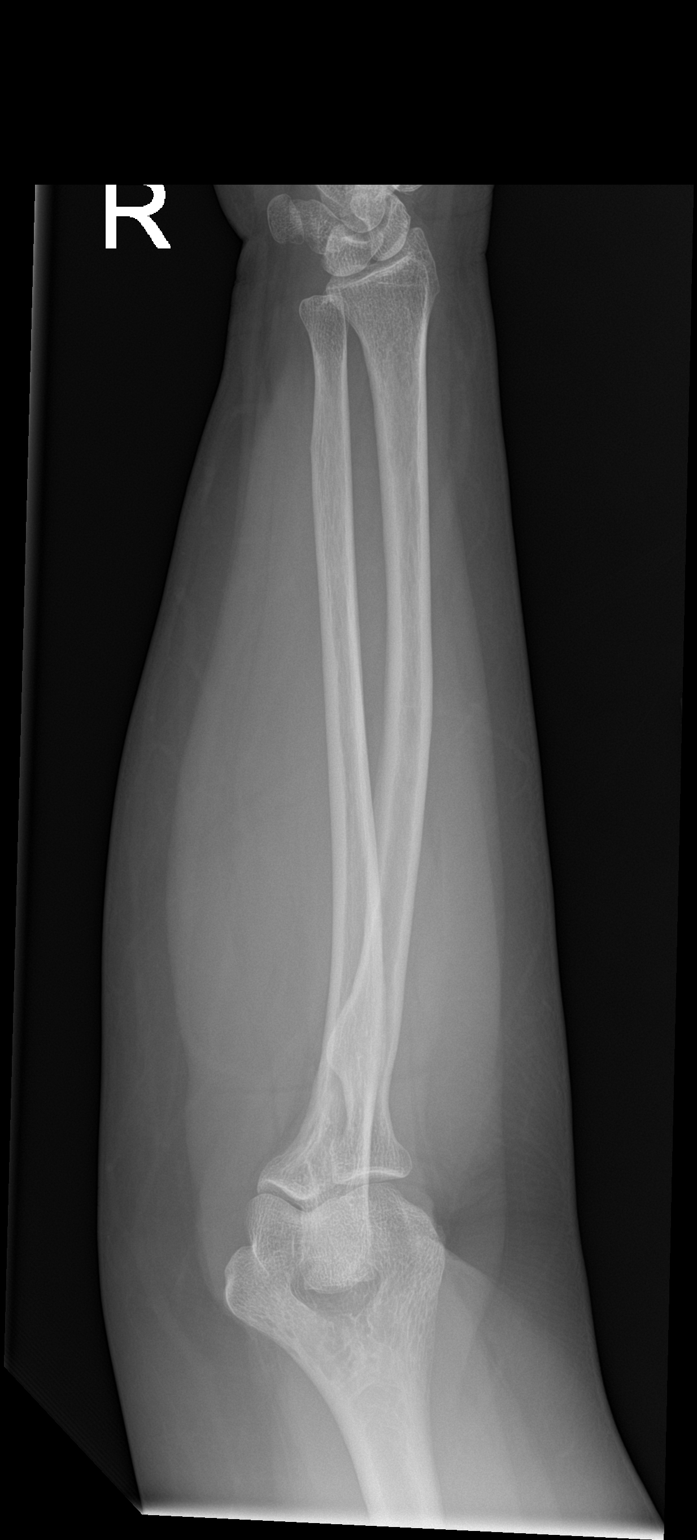

[forearm lat]
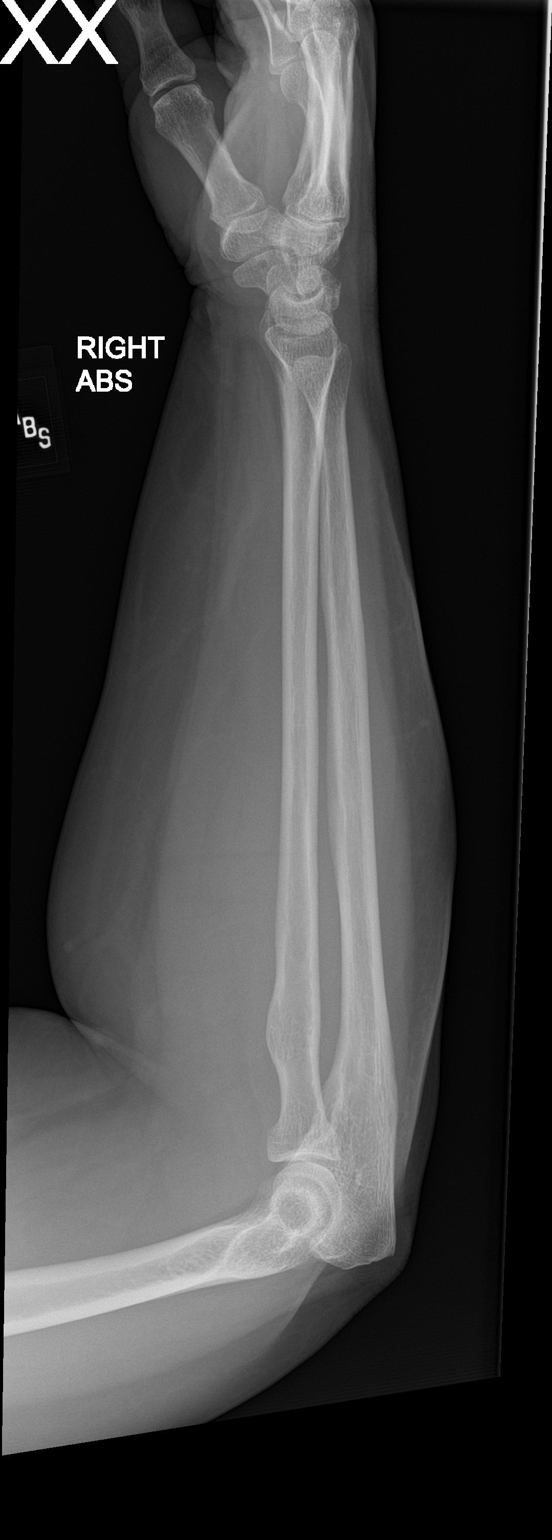

[2 of 2 positions shown; findings below may reference images not displayed]

FINDINGS: There is no evidence of fracture or other focal bone lesions. Soft
tissues are unremarkable.
IMPRESSION: Negative.

## 2017-05-01 ENCOUNTER — Telehealth: Payer: Self-pay | Admitting: Pharmacy Technician

## 2017-05-01 NOTE — Telephone Encounter (Signed)
Patient has Medicaid.  [facility] unable to provide medication assistance since patient has prescription coverage.  Pt notified.  [facility]

## 2017-05-12 ENCOUNTER — Telehealth: Payer: Self-pay

## 2017-05-12 NOTE — Telephone Encounter (Signed)
Received New patient referral from [facility] Hx Asthma  lmov to schedule appt .

## 2017-06-05 ENCOUNTER — Encounter: Payer: Self-pay | Admitting: Internal Medicine

## 2017-06-05 ENCOUNTER — Ambulatory Visit (INDEPENDENT_AMBULATORY_CARE_PROVIDER_SITE_OTHER): Payer: Medicaid Other | Admitting: Internal Medicine

## 2017-06-05 ENCOUNTER — Other Ambulatory Visit
Admission: RE | Admit: 2017-06-05 | Discharge: 2017-06-05 | Disposition: A | Discharge status: Home / Self Care | Payer: Medicaid Other | Source: Ambulatory Visit | Attending: Internal Medicine | Admitting: Internal Medicine

## 2017-06-05 VITALS — BP 132/78 | HR 125 | Resp 16 | Ht 65.0 in | Wt 226.0 lb

## 2017-06-05 DIAGNOSIS — J4540 Moderate persistent asthma, uncomplicated: Secondary | ICD-10-CM

## 2017-06-05 LAB — CBC WITH DIFFERENTIAL/PLATELET
BASOS ABS: 0 10*3/uL (ref 0–0.1)
Basophils Relative: 0 %
Eosinophils Absolute: 0.1 10*3/uL (ref 0–0.7)
Eosinophils Relative: 1 %
HEMATOCRIT: 36.3 % (ref 35.0–47.0)
Hemoglobin: 12.4 g/dL (ref 12.0–16.0)
LYMPHS ABS: 2 10*3/uL (ref 1.0–3.6)
LYMPHS PCT: 20 %
MCH: 26.4 pg (ref 26.0–34.0)
MCHC: 34.1 g/dL (ref 32.0–36.0)
MCV: 77.4 fL — AB (ref 80.0–100.0)
MONO ABS: 0.6 10*3/uL (ref 0.2–0.9)
Monocytes Relative: 7 %
NEUTROS ABS: 7 10*3/uL — AB (ref 1.4–6.5)
Neutrophils Relative %: 72 %
Platelets: 277 10*3/uL (ref 150–440)
RBC: 4.69 MIL/uL (ref 3.80–5.20)
RDW: 18.1 % — AB (ref 11.5–14.5)
WBC: 9.8 10*3/uL (ref 3.6–11.0)

## 2017-06-05 MED ORDER — BECLOMETHASONE DIPROPIONATE 80 MCG/ACT IN AERS: 2.0000 | INHALATION_SPRAY | Freq: Two times a day (BID) | RESPIRATORY_TRACT | 5 refills | Status: DC

## 2017-06-05 NOTE — Patient Instructions (Signed)
--  Increase qvar 40 to Qvar 80, 2 puffs twice daily. He may take 4 puffs of Qvar 40 twice daily until it runs out. -If not improved on this regimen, we may need to start you on Advair or similar medication.

## 2017-06-05 NOTE — Progress Notes (Signed)
[facility] Consultation      Assessment and Plan:  51 yo female with systolic CHF, asthma, OSA with persistent dyspnea.   Asthma with dyspnea on exertion.  -Persistent asthma symptoms with use of rescue inhaler 3-4 days per week. Symptoms manifesting primarily as cough and dyspnea on exertion. -We will increase Qvar 40 to Qvar 80 2 puffs twice daily. Continue rescue inhaler as needed. If not improving on this regimen, we discussed potentially switching to Advair. -We will order Rast testing, CBC with differential (most recent differential did not a include eosinophil count).  OSA.  -Obstructive sleep apnea, she uses her CPAP on most nights, however, she is not using it for the entirety of the evening. -Recommend that she use her CPAP for the duration of her sleep, every night.  Morbid obesity.  -She has a significant weight gain over the last few years, which is undoubtedly contributing to her dyspnea on exertion. -We discussed trying to increase her physical activity level.  Systolic CHF.  -Known systolic congestive heart failure, follows with cardiology at [facility].  Orders Placed This Encounter  Procedures  . CBC with Differential  . Angiotensin converting enzyme RAST allergy testing with IgE.    Meds ordered this encounter  Medications  . albuterol (PROVENTIL HFA;VENTOLIN HFA) 108 (90 Base) MCG/ACT inhaler    Sig: Inhale 2 puffs into the lungs as directed.  . beclomethasone (QVAR) 80 MCG/ACT inhaler    Sig: Inhale 2 puffs into the lungs 2 (two) times daily.    Dispense:  1 Inhaler    Refill:  5    Return in about 8 weeks (around 07/31/2017).    Date: 06/05/2017  MRN# [id number] Brittany Nguyen 04/27/1966    Brittany Nguyen is a 51 y.o. old female seen in consultation for chief complaint of:    Chief Complaint  Patient presents with  . Advice Only    former Raul Del patient  . COPD    cough, sob with exhertion, wheezing and chest  tightness.    HPI:   She notes that she started having dyspnea and short of breath about 10 months ago. She was then taken to [place], she was found to have systolic CHF with non-ischemic cardiomyopathy and has pacemake/defib.  She has OSA, has a history of DVT's, she has mild asthma. She notices that her breathing is short with mild activity such as showering, walking short distances. She tends to cough a lot particularly with walking short distances.  She feels that there is something restricting her breathing. She takes qvar 2 puffs bid and rinses her mouth afterwards. She uses albuterol 3 to 4 times per week, and feels that it helps.  She denies reflux. She does have sinus drainage.  She has no pets, she has never been tested for allergies. She has occasional allergic symptoms but does not take anything regularly. She does not take singulair.  She has been on prednisone in the past. She has a history of OSA, she uses CPAP every night and has been on it for about 2 years.   She has gained about 100 pounds over the past 2-3 years.   Images personally reviewed; x-ray chest 04/24/17; AICD in place, there is right lower lobe/right middle lobe air bronchogram present suggestive of pneumonia. VQ scan 04/25/17; patient could not complete the procedure due to coughing and wheezing.  **CBC 11/21/15; Eos=300  **outside PFT 11/26/14;   SPIROMETRY: FVC was 2.84 liters, 91% of predicted  FEV1 was 2.34, 89% of predicted FEV1 ratio was 83 FEF 25-75% liters per second was 96% of predicted  LUNG VOLUMES: TLC was 76% of predicted RV was 49% of predicted  DIFFUSION CAPACITY: DLCO was 72% of predicted DLCO/VA was 138% of predicted  FLOW VOLUME LOOP: normal  Impression Spirometry is in normal range TLC is mildly decreased DLCO is mikldly decreased but corrects for VA       PMHX:   Past Medical History:  Diagnosis Date  . Asthma 2013  . CHF (congestive heart failure) ([place])   .  Diverticulitis 2010  . DVT (deep venous thrombosis) ([place])   . Endometriosis 1990  . Heart disease 2013  . Hx MRSA infection   . Lump or mass in breast   . Restless leg    Surgical Hx:  Past Surgical History:  Procedure Laterality Date  . ABDOMINAL HYSTERECTOMY     age 75  . CARDIAC DEFIBRILLATOR PLACEMENT    . CESAREAN SECTION    . CHOLECYSTECTOMY    . COLECTOMY    . NASAL SINUS SURGERY  2012   Family Hx:  Family History  Problem Relation Age of Onset  . Cancer Mother 30       ovarian  . Cancer Father 60       brain  . Cancer Daughter 18       skin  . Cancer Maternal Aunt 3       breast   Social Hx:   Social History  Substance Use Topics  . Smoking status: Never Smoker  . Smokeless tobacco: Never Used  . Alcohol use No   Medication:    Current Outpatient Prescriptions:  .  albuterol (PROVENTIL HFA;VENTOLIN HFA) 108 (90 Base) MCG/ACT inhaler, Inhale 2 puffs into the lungs as directed., Disp: , Rfl:  .  aspirin 81 MG chewable tablet, Chew 1 tablet (81 mg total) by mouth daily., Disp: 30 tablet, Rfl: 0 .  beclomethasone (QVAR) 40 MCG/ACT inhaler, Inhale 2 puffs into the lungs 2 (two) times daily., Disp: , Rfl:  .  cyanocobalamin 500 MCG tablet, Take 500 mcg by mouth daily., Disp: , Rfl:  .  Fluticasone-Salmeterol (ADVAIR DISKUS) 500-50 MCG/DOSE AEPB, Inhale 1 puff into the lungs 2 (two) times daily., Disp: 1 each, Rfl: 0 .  furosemide (LASIX) 40 MG tablet, Take 80 mg by mouth 2 (two) times daily. , Disp: , Rfl:  .  losartan (COZAAR) 25 MG tablet, Take 25 mg by mouth daily. , Disp: , Rfl:  .  metoprolol succinate (TOPROL-XL) 50 MG 24 hr tablet, Take 50 mg by mouth daily. Take with or immediately following a meal., Disp: , Rfl:  .  nitroGLYCERIN (NITROSTAT) 0.4 MG SL tablet, Place 1 tablet (0.4 mg total) under the tongue every 5 (five) minutes as needed for chest pain., Disp: 90 tablet, Rfl: 1 .  rOPINIRole (REQUIP) 2 MG tablet, Take 1 tablet (2 mg total) by mouth at  bedtime. 2-4  Mg per night, Disp: , Rfl:  .  spironolactone (ALDACTONE) 25 MG tablet, Take 0.5 tablets (12.5 mg total) by mouth daily. (Patient taking differently: Take 25 mg by mouth daily. ), Disp: 30 tablet, Rfl: 0 .  traMADol (ULTRAM) 50 MG tablet, Take 50 mg by mouth as needed., Disp: , Rfl:    Allergies:  Contrast media [iodinated diagnostic agents]; Metrizamide; Penicillins; Isosorbide nitrate; Latex; Ondansetron; Povidone-iodine; and Pulmicort [budesonide]  Review of Systems: Gen:  Denies  fever, sweats, chills HEENT: Denies blurred  vision, double vision. bleeds, sore throat Cvc:  No dizziness, chest pain. Resp:   Denies cough or sputum production, shortness of breath Gi: Denies swallowing difficulty, stomach pain. Gu:  Denies bladder incontinence, burning urine Ext:   No Joint pain, stiffness. Skin: No skin rash,  hives  Endoc:  No polyuria, polydipsia. Psych: No depression, insomnia. Other:  All other systems were reviewed with the patient and were negative other that what is mentioned in the HPI.   Physical Examination:   VS: BP 132/78 (BP Location: Left Arm, Cuff Size: Large)   Pulse (!) 125   Resp 16   Ht 5\' 5"  (1.651 m)   Wt 226 lb (102.5 kg)   LMP 01/22/2012 (Approximate) Comment: Hysterectomy 5 years ago  SpO2 98%   BMI 37.61 kg/m   General Appearance: No distress  Neuro:without focal findings,  speech normal,  HEENT: PERRLA, EOM intact.   Pulmonary: normal breath sounds, No wheezing.  CardiovascularNormal S1,S2.  No m/r/g.   Abdomen: Benign, Soft, non-tender. Renal:  No costovertebral tenderness  GU:  No performed at this time. Endoc: No evident thyromegaly, no signs of acromegaly. Skin:   warm, no rashes, no ecchymosis  Extremities: normal, no cyanosis, clubbing.  Other findings:    LABORATORY PANEL:   CBC No results for input(s): WBC, HGB, HCT, PLT in the last 168  hours. ------------------------------------------------------------------------------------------------------------------  Chemistries  No results for input(s): NA, K, CL, CO2, GLUCOSE, BUN, CREATININE, CALCIUM, MG, AST, ALT, ALKPHOS, BILITOT in the last 168 hours.  Invalid input(s): GFRCGP ------------------------------------------------------------------------------------------------------------------  Cardiac Enzymes No results for input(s): TROPONINI in the last 168 hours. ------------------------------------------------------------  RADIOLOGY:  No results found.     Thank  you for the consultation and for allowing [facility] to assist in the care of your patient. Our recommendations are noted above.  Please contact us if we can be of further service.   Marda Stalker, MD.  Board Certified in Internal Medicine, Pulmonary Medicine, [place], and Sleep Medicine.  [facility] Office Number: [phone]  Patricia Pesa, M.D.  Merton Border, M.D  06/05/2017

## 2017-06-06 LAB — ANGIOTENSIN CONVERTING ENZYME: Angiotensin-Converting Enzyme: 53 U/L (ref 14–82)

## 2017-06-07 LAB — MISC LABCORP TEST (SEND OUT): LABCORP TEST CODE: 602628

## 2017-06-18 ENCOUNTER — Emergency Department
Admission: EM | Admit: 2017-06-18 | Discharge: 2017-06-18 | Disposition: A | Discharge status: Home / Self Care | Payer: Medicaid Other | Attending: Emergency Medicine | Admitting: Emergency Medicine

## 2017-06-18 ENCOUNTER — Emergency Department: Payer: Medicaid Other

## 2017-06-18 DIAGNOSIS — Z950 Presence of cardiac pacemaker: Secondary | ICD-10-CM | POA: Insufficient documentation

## 2017-06-18 DIAGNOSIS — R0789 Other chest pain: Secondary | ICD-10-CM | POA: Insufficient documentation

## 2017-06-18 DIAGNOSIS — I119 Hypertensive heart disease without heart failure: Secondary | ICD-10-CM | POA: Insufficient documentation

## 2017-06-18 DIAGNOSIS — Z79899 Other long term (current) drug therapy: Secondary | ICD-10-CM | POA: Diagnosis not present

## 2017-06-18 DIAGNOSIS — Y939 Activity, unspecified: Secondary | ICD-10-CM | POA: Diagnosis not present

## 2017-06-18 DIAGNOSIS — Z91040 Latex allergy status: Secondary | ICD-10-CM | POA: Insufficient documentation

## 2017-06-18 DIAGNOSIS — Y999 Unspecified external cause status: Secondary | ICD-10-CM | POA: Insufficient documentation

## 2017-06-18 DIAGNOSIS — Y929 Unspecified place or not applicable: Secondary | ICD-10-CM | POA: Diagnosis not present

## 2017-06-18 DIAGNOSIS — J45909 Unspecified asthma, uncomplicated: Secondary | ICD-10-CM | POA: Insufficient documentation

## 2017-06-18 DIAGNOSIS — R079 Chest pain, unspecified: Secondary | ICD-10-CM | POA: Diagnosis present

## 2017-06-18 DIAGNOSIS — M25512 Pain in left shoulder: Secondary | ICD-10-CM | POA: Insufficient documentation

## 2017-06-18 MED ORDER — HYDROMORPHONE HCL 1 MG/ML IJ SOLN
1.0000 mg | Freq: Once | INTRAMUSCULAR | Status: AC
  Administered 2017-06-18: 1 mg via INTRAMUSCULAR
  Filled 2017-06-18: qty 1

## 2017-06-18 MED ORDER — HYDROCODONE-ACETAMINOPHEN 5-325 MG PO TABS: 1.0000 | ORAL_TABLET | ORAL | 0 refills | Status: DC | PRN
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
## 2017-06-18 NOTE — ED Notes (Signed)
Patient taken to imaging. 

## 2017-07-03 ENCOUNTER — Emergency Department: Payer: Medicaid Other

## 2017-07-03 ENCOUNTER — Inpatient Hospital Stay
Admission: EM | Admit: 2017-07-03 | Discharge: 2017-07-05 | DRG: 871 | Disposition: A | Discharge status: Home / Self Care | Payer: Medicaid Other | Attending: Internal Medicine | Admitting: Internal Medicine

## 2017-07-03 DIAGNOSIS — R1110 Vomiting, unspecified: Secondary | ICD-10-CM | POA: Diagnosis present

## 2017-07-03 DIAGNOSIS — E876 Hypokalemia: Secondary | ICD-10-CM | POA: Diagnosis present

## 2017-07-03 DIAGNOSIS — Z91041 Radiographic dye allergy status: Secondary | ICD-10-CM

## 2017-07-03 DIAGNOSIS — Z79899 Other long term (current) drug therapy: Secondary | ICD-10-CM | POA: Diagnosis not present

## 2017-07-03 DIAGNOSIS — J45909 Unspecified asthma, uncomplicated: Secondary | ICD-10-CM | POA: Diagnosis present

## 2017-07-03 DIAGNOSIS — A409 Streptococcal sepsis, unspecified: Secondary | ICD-10-CM | POA: Diagnosis present

## 2017-07-03 DIAGNOSIS — J020 Streptococcal pharyngitis: Secondary | ICD-10-CM | POA: Diagnosis present

## 2017-07-03 DIAGNOSIS — Z90710 Acquired absence of both cervix and uterus: Secondary | ICD-10-CM | POA: Diagnosis not present

## 2017-07-03 DIAGNOSIS — J9601 Acute respiratory failure with hypoxia: Secondary | ICD-10-CM | POA: Diagnosis present

## 2017-07-03 DIAGNOSIS — R52 Pain, unspecified: Secondary | ICD-10-CM

## 2017-07-03 DIAGNOSIS — Z880 Allergy status to penicillin: Secondary | ICD-10-CM | POA: Diagnosis not present

## 2017-07-03 DIAGNOSIS — Z95810 Presence of automatic (implantable) cardiac defibrillator: Secondary | ICD-10-CM | POA: Diagnosis not present

## 2017-07-03 DIAGNOSIS — Z6838 Body mass index (BMI) 38.0-38.9, adult: Secondary | ICD-10-CM | POA: Diagnosis not present

## 2017-07-03 DIAGNOSIS — Z888 Allergy status to other drugs, medicaments and biological substances status: Secondary | ICD-10-CM

## 2017-07-03 DIAGNOSIS — Z91040 Latex allergy status: Secondary | ICD-10-CM

## 2017-07-03 DIAGNOSIS — I5022 Chronic systolic (congestive) heart failure: Secondary | ICD-10-CM | POA: Diagnosis present

## 2017-07-03 DIAGNOSIS — A419 Sepsis, unspecified organism: Secondary | ICD-10-CM | POA: Diagnosis present

## 2017-07-03 DIAGNOSIS — Z7951 Long term (current) use of inhaled steroids: Secondary | ICD-10-CM | POA: Diagnosis not present

## 2017-07-03 DIAGNOSIS — Z7982 Long term (current) use of aspirin: Secondary | ICD-10-CM

## 2017-07-03 LAB — MRSA PCR SCREENING: MRSA by PCR: NEGATIVE

## 2017-07-03 LAB — CBC WITH DIFFERENTIAL/PLATELET
BASOS PCT: 1 %
Basophils Absolute: 0.2 10*3/uL — ABNORMAL HIGH (ref 0–0.1)
EOS ABS: 0 10*3/uL (ref 0–0.7)
Eosinophils Relative: 0 %
HEMATOCRIT: 36.3 % (ref 35.0–47.0)
HEMOGLOBIN: 12.2 g/dL (ref 12.0–16.0)
Lymphocytes Relative: 4 %
Lymphs Abs: 0.8 10*3/uL — ABNORMAL LOW (ref 1.0–3.6)
MCH: 26 pg (ref 26.0–34.0)
MCHC: 33.6 g/dL (ref 32.0–36.0)
MCV: 77.4 fL — AB (ref 80.0–100.0)
MONO ABS: 0.7 10*3/uL (ref 0.2–0.9)
Monocytes Relative: 3 %
NEUTROS ABS: 19.6 10*3/uL — AB (ref 1.4–6.5)
NEUTROS PCT: 92 %
PLATELETS: 283 10*3/uL (ref 150–440)
RBC: 4.69 MIL/uL (ref 3.80–5.20)
RDW: 18.1 % — ABNORMAL HIGH (ref 11.5–14.5)
WBC: 21.2 10*3/uL — ABNORMAL HIGH (ref 3.6–11.0)

## 2017-07-03 LAB — URINALYSIS, COMPLETE (UACMP) WITH MICROSCOPIC
BACTERIA UA: NONE SEEN
BILIRUBIN URINE: NEGATIVE
Glucose, UA: NEGATIVE mg/dL
Hgb urine dipstick: NEGATIVE
KETONES UR: NEGATIVE mg/dL
LEUKOCYTES UA: NEGATIVE
NITRITE: NEGATIVE
PROTEIN: NEGATIVE mg/dL
RBC / HPF: NONE SEEN RBC/hpf (ref 0–5)
Specific Gravity, Urine: 1.01 (ref 1.005–1.030)
WBC UA: NONE SEEN WBC/hpf (ref 0–5)
pH: 5 (ref 5.0–8.0)

## 2017-07-03 LAB — LACTIC ACID, PLASMA
Lactic Acid, Venous: 3.6 mmol/L (ref 0.5–1.9)
Lactic Acid, Venous: 1.7 mmol/L (ref 0.5–1.9)

## 2017-07-03 LAB — MAGNESIUM: Magnesium: 1.5 mg/dL — ABNORMAL LOW (ref 1.7–2.4)

## 2017-07-03 LAB — COMPREHENSIVE METABOLIC PANEL
ALK PHOS: 96 U/L (ref 38–126)
ALT: 21 U/L (ref 14–54)
ANION GAP: 13 (ref 5–15)
AST: 30 U/L (ref 15–41)
Albumin: 4.1 g/dL (ref 3.5–5.0)
BILIRUBIN TOTAL: 0.6 mg/dL (ref 0.3–1.2)
BUN: 10 mg/dL (ref 6–20)
CALCIUM: 9.2 mg/dL (ref 8.9–10.3)
CO2: 27 mmol/L (ref 22–32)
Chloride: 96 mmol/L — ABNORMAL LOW (ref 101–111)
Creatinine, Ser: 1.09 mg/dL — ABNORMAL HIGH (ref 0.44–1.00)
GFR calc non Af Amer: 59 mL/min — ABNORMAL LOW (ref >60)
Glucose, Bld: 149 mg/dL — ABNORMAL HIGH (ref 65–99)
Potassium: 2.7 mmol/L — CL (ref 3.5–5.1)
Sodium: 136 mmol/L (ref 135–145)
TOTAL PROTEIN: 8.3 g/dL — AB (ref 6.5–8.1)

## 2017-07-03 LAB — INFLUENZA PANEL BY PCR (TYPE A & B)
Influenza A By PCR: NEGATIVE
Influenza B By PCR: NEGATIVE

## 2017-07-03 LAB — POTASSIUM: Potassium: 3.4 mmol/L — ABNORMAL LOW (ref 3.5–5.1)

## 2017-07-03 LAB — GLUCOSE, CAPILLARY: Glucose-Capillary: 141 mg/dL — ABNORMAL HIGH (ref 65–99)

## 2017-07-03 LAB — POCT RAPID STREP A: Streptococcus, Group A Screen (Direct): POSITIVE — AB

## 2017-07-03 MED ORDER — LOSARTAN POTASSIUM 25 MG PO TABS
25.0000 mg | ORAL_TABLET | Freq: Every day | ORAL | Status: DC
  Administered 2017-07-03 – 2017-07-04 (×2): 25 mg via ORAL
  Filled 2017-07-03 (×2): qty 1

## 2017-07-03 MED ORDER — POTASSIUM CHLORIDE CRYS ER 20 MEQ PO TBCR
20.0000 meq | EXTENDED_RELEASE_TABLET | Freq: Every day | ORAL | Status: DC
  Administered 2017-07-03 – 2017-07-04 (×2): 20 meq via ORAL
  Filled 2017-07-03 (×2): qty 1

## 2017-07-03 MED ORDER — SODIUM CHLORIDE 0.9 % IV BOLUS (SEPSIS)
1000.0000 mL | Freq: Once | INTRAVENOUS | Status: AC
  Administered 2017-07-03: 1000 mL via INTRAVENOUS

## 2017-07-03 MED ORDER — MAGNESIUM SULFATE 2 GM/50ML IV SOLN
2.0000 g | Freq: Once | INTRAVENOUS | Status: AC
  Administered 2017-07-03: 2 g via INTRAVENOUS
  Filled 2017-07-03: qty 50

## 2017-07-03 MED ORDER — PROMETHAZINE HCL 25 MG PO TABS
12.5000 mg | ORAL_TABLET | Freq: Four times a day (QID) | ORAL | Status: DC | PRN
  Administered 2017-07-04 – 2017-07-05 (×2): 12.5 mg via ORAL
  Filled 2017-07-03 (×3): qty 1

## 2017-07-03 MED ORDER — MORPHINE SULFATE (PF) 4 MG/ML IV SOLN
4.0000 mg | Freq: Once | INTRAVENOUS | Status: AC
  Administered 2017-07-03: 4 mg via INTRAVENOUS
  Filled 2017-07-03: qty 1

## 2017-07-03 MED ORDER — CLINDAMYCIN PHOSPHATE 900 MG/50ML IV SOLN
900.0000 mg | Freq: Three times a day (TID) | INTRAVENOUS | Status: DC
  Administered 2017-07-03 – 2017-07-05 (×6): 900 mg via INTRAVENOUS
  Filled 2017-07-03 (×8): qty 50

## 2017-07-03 MED ORDER — VITAMIN B-12 1000 MCG PO TABS
500.0000 ug | ORAL_TABLET | Freq: Every day | ORAL | Status: DC
  Administered 2017-07-03 – 2017-07-04 (×2): 500 ug via ORAL
  Filled 2017-07-03 (×2): qty 1

## 2017-07-03 MED ORDER — ACETAMINOPHEN 325 MG PO TABS
650.0000 mg | ORAL_TABLET | Freq: Four times a day (QID) | ORAL | Status: DC | PRN
  Administered 2017-07-03 – 2017-07-04 (×5): 650 mg via ORAL
  Filled 2017-07-03 (×6): qty 2

## 2017-07-03 MED ORDER — SODIUM CHLORIDE 0.9 % IV SOLN
INTRAVENOUS | Status: DC
  Administered 2017-07-03 – 2017-07-04 (×3): via INTRAVENOUS

## 2017-07-03 MED ORDER — POTASSIUM CHLORIDE 10 MEQ/100ML IV SOLN
10.0000 meq | INTRAVENOUS | Status: AC
  Administered 2017-07-03 (×4): 10 meq via INTRAVENOUS
  Filled 2017-07-03 (×4): qty 100

## 2017-07-03 MED ORDER — SODIUM CHLORIDE 0.9 % IV SOLN: 250.0000 mL | INTRAVENOUS | Status: DC | PRN

## 2017-07-03 MED ORDER — POTASSIUM CHLORIDE 10 MEQ/100ML IV SOLN
10.0000 meq | INTRAVENOUS | Status: DC
  Filled 2017-07-03 (×4): qty 100

## 2017-07-03 MED ORDER — ENOXAPARIN SODIUM 40 MG/0.4ML ~~LOC~~ SOLN
40.0000 mg | SUBCUTANEOUS | Status: DC
  Administered 2017-07-03 – 2017-07-04 (×2): 40 mg via SUBCUTANEOUS
  Filled 2017-07-03 (×2): qty 0.4

## 2017-07-03 MED ORDER — POTASSIUM CHLORIDE 20 MEQ PO PACK
40.0000 meq | PACK | Freq: Once | ORAL | Status: AC
  Administered 2017-07-03: 40 meq via ORAL
  Filled 2017-07-03: qty 2

## 2017-07-03 MED ORDER — SUMATRIPTAN SUCCINATE 50 MG PO TABS
50.0000 mg | ORAL_TABLET | ORAL | Status: DC | PRN
  Administered 2017-07-03: 50 mg via ORAL
  Filled 2017-07-03 (×3): qty 1

## 2017-07-03 MED ORDER — METOCLOPRAMIDE HCL 5 MG/ML IJ SOLN
INTRAMUSCULAR | Status: AC
  Administered 2017-07-03: 10 mg via INTRAVENOUS
  Filled 2017-07-03: qty 2

## 2017-07-03 MED ORDER — SODIUM CHLORIDE 0.9% FLUSH
3.0000 mL | Freq: Two times a day (BID) | INTRAVENOUS | Status: DC
  Administered 2017-07-03 – 2017-07-04 (×3): 3 mL via INTRAVENOUS

## 2017-07-03 MED ORDER — IBUPROFEN 400 MG PO TABS
800.0000 mg | ORAL_TABLET | Freq: Three times a day (TID) | ORAL | Status: DC
  Administered 2017-07-03: 800 mg via ORAL
  Filled 2017-07-03: qty 2

## 2017-07-03 MED ORDER — ROPINIROLE HCL 1 MG PO TABS
2.0000 mg | ORAL_TABLET | Freq: Every day | ORAL | Status: DC
  Administered 2017-07-03 – 2017-07-04 (×2): 4 mg via ORAL
  Filled 2017-07-03 (×3): qty 4

## 2017-07-03 MED ORDER — SENNOSIDES-DOCUSATE SODIUM 8.6-50 MG PO TABS: 1.0000 | ORAL_TABLET | Freq: Every evening | ORAL | Status: DC | PRN

## 2017-07-03 MED ORDER — METOCLOPRAMIDE HCL 5 MG/ML IJ SOLN
10.0000 mg | Freq: Once | INTRAMUSCULAR | Status: AC
  Administered 2017-07-03: 10 mg via INTRAVENOUS

## 2017-07-03 MED ORDER — CLINDAMYCIN PHOSPHATE 600 MG/50ML IV SOLN
600.0000 mg | Freq: Once | INTRAVENOUS | Status: AC
  Administered 2017-07-03: 600 mg via INTRAVENOUS
  Filled 2017-07-03 (×2): qty 50

## 2017-07-03 MED ORDER — OXYCODONE-ACETAMINOPHEN 5-325 MG PO TABS
1.0000 | ORAL_TABLET | Freq: Four times a day (QID) | ORAL | Status: DC | PRN
  Administered 2017-07-03 – 2017-07-04 (×3): 1 via ORAL
  Filled 2017-07-03 (×3): qty 1

## 2017-07-03 MED ORDER — ASPIRIN 81 MG PO CHEW
81.0000 mg | CHEWABLE_TABLET | Freq: Every day | ORAL | Status: DC
  Administered 2017-07-03 – 2017-07-04 (×2): 81 mg via ORAL
  Filled 2017-07-03 (×2): qty 1

## 2017-07-03 MED ORDER — IBUPROFEN 400 MG PO TABS: 800.0000 mg | ORAL_TABLET | Freq: Three times a day (TID) | ORAL | Status: DC | PRN

## 2017-07-03 MED ORDER — SODIUM CHLORIDE 0.9% FLUSH: 3.0000 mL | INTRAVENOUS | Status: DC | PRN

## 2017-07-03 MED ORDER — ACETAMINOPHEN 325 MG PO TABS
650.0000 mg | ORAL_TABLET | Freq: Once | ORAL | Status: AC
  Administered 2017-07-03: 650 mg via ORAL
  Filled 2017-07-03: qty 2

## 2017-07-03 MED ORDER — METOPROLOL SUCCINATE ER 25 MG PO TB24
25.0000 mg | ORAL_TABLET | Freq: Two times a day (BID) | ORAL | Status: DC
Start: 2017-07-03 — End: 2017-07-03
  Administered 2017-07-03: 25 mg via ORAL
  Filled 2017-07-03: qty 1

## 2017-07-03 MED ORDER — ACETAMINOPHEN 650 MG RE SUPP: 650.0000 mg | Freq: Four times a day (QID) | RECTAL | Status: DC | PRN

## 2017-07-03 NOTE — H&P (Addendum)
[place] [facility] [place] NAME: Brittany Nguyen    MR#:  [id number]  DATE OF BIRTH:  1966-07-30  DATE OF ADMISSION:  07/03/2017  PRIMARY CARE PHYSICIAN: [name], MD   REQUESTING/REFERRING PHYSICIAN:   CHIEF COMPLAINT:   Chief Complaint  Patient presents with  . Fever  . Generalized Body Aches    HISTORY OF PRESENT ILLNESS: Brittany Nguyen  is a 51 y.o. female with a known history of bronchial asthma, congestive heart failure, diverticular disease, DVT, endometriosis, restless leg syndrome presented to the emergency room with body aches and fever. She also complains of pain in her throat and been going on for the last 2 days. Patient was evaluated in the emergency room for the above symptoms. She had at MAXIMUM TEMPERATURE of 101.39F. Lactic acid level was elevated. Patient was given IV fluids based on sepsis protocol and IV antibiotic. Strep throat was positive. Flu test was negative. No recent travel. No sick contacts at home. Patient also complained of shortness of breath in the emergency room she was put on BiPAP. Patient usually uses CPAP at bedtime at home.  PAST MEDICAL HISTORY:   Past Medical History:  Diagnosis Date  . Asthma 2013  . CHF (congestive heart failure) ([place])   . Diverticulitis 2010  . DVT (deep venous thrombosis) ([place])   . Endometriosis 1990  . Heart disease 2013  . Hx MRSA infection   . Lump or mass in breast   . Restless leg     PAST SURGICAL HISTORY: Past Surgical History:  Procedure Laterality Date  . ABDOMINAL HYSTERECTOMY     age 82  . CARDIAC DEFIBRILLATOR PLACEMENT    . CESAREAN SECTION    . CHOLECYSTECTOMY    . COLECTOMY    . NASAL SINUS SURGERY  2012    SOCIAL HISTORY:  Social History  Substance Use Topics  . Smoking status: Never Smoker  . Smokeless tobacco: Never Used  . Alcohol use No    FAMILY HISTORY:  Family History  Problem Relation Age of Onset  . Cancer Mother 30        ovarian  . Cancer Father 49       brain  . Cancer Daughter 18       skin  . Cancer Maternal Aunt 34       breast    DRUG ALLERGIES:  Allergies  Allergen Reactions  . Contrast Media [Iodinated Diagnostic Agents] Shortness Of Breath  . Metrizamide Shortness Of Breath  . Penicillins Hives and Other (See Comments)    Other reaction(s): Other (See Comments) Has patient had a PCN reaction causing immediate rash, facial/tongue/throat swelling, SOB or lightheadedness with hypotension: Yes Has patient had a PCN reaction causing severe rash involving mucus membranes or skin necrosis: No Has patient had a PCN reaction that required hospitalization No Has patient had a PCN reaction occurring within the last 10 years: No If all of the above answers are "NO", then may proceed with Cephalosporin use. Has patient had a PCN reaction causing immediate rash, facial/tongue/throat swelling, SOB or lightheadedness with hypotension: Yes Has patient had a PCN reaction causing severe rash involving mucus membranes or skin necrosis: No Has patient had a PCN reaction that required hospitalization No Has patient had a PCN reaction occurring within the last 10 years: No If all of the above answers are "NO", then may proceed with Cephalosporin use.   . Isosorbide Nitrate     Other  reaction(s): Headache  . Latex Hives  . Ondansetron Other (See Comments)    Severe headache  . Povidone-Iodine Rash    Blistering rash  . Pulmicort [Budesonide] Itching    REVIEW OF SYSTEMS:   CONSTITUTIONAL: Has fever, fatigue and weakness.  Body aches present EYES: No blurred or double vision.  EARS, NOSE, AND THROAT: No tinnitus or ear pain.  Has sore throat RESPIRATORY: Has cough, shortness of breath,  No wheezing or hemoptysis.  CARDIOVASCULAR: No chest pain, orthopnea, edema.  GASTROINTESTINAL: Has nausea,  No vomiting, diarrhea or abdominal pain.  GENITOURINARY: No dysuria, hematuria.  ENDOCRINE: No polyuria,  nocturia,  HEMATOLOGY: No anemia, easy bruising or bleeding SKIN: No rash or lesion. MUSCULOSKELETAL: No joint pain or arthritis.   NEUROLOGIC: No tingling, numbness, weakness.  PSYCHIATRY: No anxiety or depression.   MEDICATIONS AT HOME:  Prior to Admission medications   Medication Sig Start Date End Date Taking? Authorizing Provider  albuterol (PROVENTIL HFA;VENTOLIN HFA) 108 (90 Base) MCG/ACT inhaler Inhale 2 puffs into the lungs as directed. 08/28/16 08/28/17 Yes [provider]  beclomethasone (QVAR) 80 MCG/ACT inhaler Inhale 2 puffs into the lungs 2 (two) times daily. 06/05/17 06/05/18 Yes Laverle Hobby, MD  cyanocobalamin 500 MCG tablet Take 500 mcg by mouth daily.   Yes [provider]  furosemide (LASIX) 40 MG tablet Take 80 mg by mouth 2 (two) times daily.    Yes [provider]  losartan (COZAAR) 25 MG tablet Take 25 mg by mouth daily.    Yes [provider]  metoprolol succinate (TOPROL-XL) 25 MG 24 hr tablet Take 25 mg by mouth 2 (two) times daily. Total 75 mg daily 06/07/17  Yes [provider]  nitroGLYCERIN (NITROSTAT) 0.4 MG SL tablet Place 1 tablet (0.4 mg total) under the tongue every 5 (five) minutes as needed for chest pain. 01/24/17  Yes Sainani, Belia Heman, MD  rOPINIRole (REQUIP) 2 MG tablet Take 1 tablet (2 mg total) by mouth at bedtime. 2-4  Mg per night 04/26/17  Yes Mody, Sital, MD  spironolactone (ALDACTONE) 25 MG tablet Take 0.5 tablets (12.5 mg total) by mouth daily. Patient taking differently: Take 25 mg by mouth daily.  01/31/17  Yes [name], MD  traMADol (ULTRAM) 50 MG tablet Take 50 mg by mouth as needed.   Yes [provider]  aspirin 81 MG chewable tablet Chew 1 tablet (81 mg total) by mouth daily. Patient not taking: Reported on 07/03/2017 12/09/16   [name], MD  HYDROcodone-acetaminophen (NORCO/VICODIN) 5-325 MG tablet Take 1 tablet by mouth every 4 (four) hours as needed. Patient not taking:  Reported on 07/03/2017 06/18/17   Harvest Dark, MD  metoprolol succinate (TOPROL-XL) 50 MG 24 hr tablet Take 50 mg by mouth daily. Total 75 mg daily.Take with or immediately following a meal.    [provider]  potassium chloride SA (K-DUR,KLOR-CON) 20 MEQ tablet Take 1 tablet by mouth daily. 06/05/17   [provider]  QVAR REDIHALER 80 MCG/ACT inhaler INHALE 2 PUFFS PO BID 06/12/17   [provider]  spironolactone (ALDACTONE) 50 MG tablet Take 1 tablet by mouth daily. 06/13/17   [provider]      PHYSICAL EXAMINATION:   VITAL SIGNS: Blood pressure 126/85, pulse (!) 113, temperature (!) 101.1 F (38.4 C), temperature source Oral, resp. rate 20, last menstrual period 01/22/2012, SpO2 96 %.  GENERAL:  51 y.o.-year-old patient lying in the bed with no acute distress.  EYES: Pupils equal,  round, reactive to light and accommodation. No scleral icterus. Extraocular muscles intact.  HEENT: Head atraumatic, normocephalic. Oropharynx dry and erythematousand nasopharynx clear.  NECK:  Supple, no jugular venous distention. No thyroid enlargement, no tenderness.  LUNGS: Decreased breath sounds bilaterally, no wheezing, scattered rales notedrales. No use of accessory muscles of respiration.  CARDIOVASCULAR: S1, S2 tachycardia noted. No murmurs, rubs, or gallops.  ABDOMEN: Soft, nontender, nondistended. Bowel sounds present. No organomegaly or mass.  EXTREMITIES: No pedal edema, cyanosis, or clubbing.  NEUROLOGIC: Cranial nerves II through XII are intact. Muscle strength 5/5 in all extremities. Sensation intact. Gait not checked.  PSYCHIATRIC: The patient is alert and oriented x 3.  SKIN: No obvious rash, lesion, or ulcer.   LABORATORY PANEL:   CBC  Recent Labs Lab 07/03/17 0410  WBC 21.2*  HGB 12.2  HCT 36.3  PLT 283  MCV 77.4*  MCH 26.0  MCHC 33.6  RDW 18.1*  LYMPHSABS 0.8*  MONOABS 0.7  EOSABS 0.0  BASOSABS 0.2*    ------------------------------------------------------------------------------------------------------------------  Chemistries   Recent Labs Lab 07/03/17 0410  NA 136  K 2.7*  CL 96*  CO2 27  GLUCOSE 149*  BUN 10  CREATININE 1.09*  CALCIUM 9.2  AST 30  ALT 21  ALKPHOS 96  BILITOT 0.6   ------------------------------------------------------------------------------------------------------------------ CrCl cannot be calculated (Unknown ideal weight.). ------------------------------------------------------------------------------------------------------------------ No results for input(s): TSH, T4TOTAL, T3FREE, THYROIDAB in the last 72 hours.  Invalid input(s): FREET3   Coagulation profile No results for input(s): INR, PROTIME in the last 168 hours. ------------------------------------------------------------------------------------------------------------------- No results for input(s): DDIMER in the last 72 hours. -------------------------------------------------------------------------------------------------------------------  Cardiac Enzymes No results for input(s): CKMB, TROPONINI, MYOGLOBIN in the last 168 hours.  Invalid input(s): CK ------------------------------------------------------------------------------------------------------------------ Invalid input(s): POCBNP  ---------------------------------------------------------------------------------------------------------------  Urinalysis    Component Value Date/Time   COLORURINE STRAW (A) 02/01/2016 1953   APPEARANCEUR CLEAR (A) 02/01/2016 1953   APPEARANCEUR Clear 09/02/2014 2312   LABSPEC 1.006 02/01/2016 1953   LABSPEC 1.013 09/02/2014 2312   PHURINE 7.0 02/01/2016 1953   GLUCOSEU NEGATIVE 02/01/2016 1953   GLUCOSEU Negative 09/02/2014 2312   HGBUR NEGATIVE 02/01/2016 1953   BILIRUBINUR NEGATIVE 02/01/2016 1953   BILIRUBINUR Negative 09/02/2014 2312   KETONESUR NEGATIVE 02/01/2016 1953    PROTEINUR NEGATIVE 02/01/2016 1953   NITRITE NEGATIVE 02/01/2016 1953   LEUKOCYTESUR NEGATIVE 02/01/2016 1953   LEUKOCYTESUR Negative 09/02/2014 2312     RADIOLOGY: Dg Chest 2 View  Result Date: 07/03/2017 CLINICAL DATA:  Fever and body aches. EXAM: CHEST  2 VIEW COMPARISON:  Radiograph 06/18/2017 FINDINGS: Left-sided pacemaker in place, unchanged in position. The cardiomediastinal contours are normal. The lungs are clear. Pulmonary vasculature is normal. No consolidation, pleural effusion, or pneumothorax. No acute osseous abnormalities are seen. IMPRESSION: No acute pulmonary process. Electronically Signed   By: Jeb Levering M.D.   On: 07/03/2017 04:24    EKG: Orders placed or performed during the hospital encounter of 07/03/17  . ED EKG 12-Lead  . ED EKG 12-Lead  . EKG 12-Lead  . EKG 12-Lead    IMPRESSION AND PLAN: 51 year old female patient with history of congestive heart failure, bronchial asthma, diverticular disease, DVT presented to the emergency room with sore throat, body aches fever. Lactic acid level was elevated and WBC count was also elevated. Patient also had shortness of breath and was put on BiPAP. Admitting diagnosis 1. Sepsis 2. Streptococcal sore throat infection 3. Hypokalemia 4. Congestive heart failure Treatment plan Admit patient to stepdown unit IV fluids Start patient  on IV clindamycin antibiotic Patient allergic to penicillin Replace potassium Follow-up lactic acid level Continue BiPAP Hold diuretics for now Intensivist on call notified   All the records are reviewed and case discussed with ED provider. Management plans discussed with the patient, family and they are in agreement.  CODE STATUS:FULL CODE Code Status History    Date Active Date Inactive Code Status Order ID Comments User Context   04/25/2017  1:46 AM 04/26/2017  3:16 PM Full Code [id number]  Dawson, Ubaldo Glassing, DO ED   01/28/2017 10:33 AM 01/30/2017  5:41 PM Full Code [id number]   [name], MD Inpatient   01/22/2017  1:14 AM 01/24/2017  5:59 PM Full Code [id number]  Lance Coon, MD Inpatient   12/09/2016  2:09 AM 12/09/2016  3:07 PM Full Code [id number]  Lance Coon, MD Inpatient   11/21/2015  6:50 AM 11/22/2015  3:50 PM Full Code [id number]  Saundra Shelling, MD Inpatient   09/19/2015  5:11 AM 09/21/2015  6:29 PM Full Code [id number]  Harrie Foreman, MD Inpatient       TOTAL TIME TAKING CARE OF THIS PATIENT: 52 minutes.    Saundra Shelling M.D on 07/03/2017 at 6:40 AM  Between 7am to 6pm - Pager - [phone]  After 6pm go to [email or web address] - password [email or web address] [facility]  [facility]  Office  [phone]  CC: Primary care physician; [name], MD

## 2017-07-03 NOTE — ED Notes (Signed)
CRITICAL LAB: LACTIC is 3.6, [place], Lab, Dr. Dahlia Client notified, orders received

## 2017-07-03 NOTE — ED Notes (Signed)
Pt reports malaise and fever with vomiting x 6 that started yesterday after church, reports taking 1gram tylenol approx 0300  Pt vomited during assessment, pt had pacemaker and defibrillator installed 3 months ago for "part of the heart not working.

## 2017-07-03 NOTE — Progress Notes (Addendum)
MEDICATION RELATED CONSULT NOTE - Electrolytes  Pharmacy Consult for Electrolytes Indication: Hypokalemia  Patient Measurements: Height: 5\' 5"  (165.1 cm) Weight: 229 lb 8 oz (104.1 kg) IBW/kg (Calculated) : 57   Labs: Sodium (mmol/L)  Date Value  07/03/2017 136  10/03/2014 139   Potassium (mmol/L)  Date Value  07/03/2017 2.7 (LL)  10/03/2014 3.9   Magnesium (mg/dL)  Date Value  07/03/2017 1.5 (L)  11/07/2013 2.0   Phosphorus (mg/dL)  Date Value  01/29/2017 4.3    Calcium (mg/dL)  Date Value  07/03/2017 9.2   Calcium, Total (mg/dL)  Date Value  10/03/2014 9.0   Potassium (mmol/L)  Date Value  07/03/2017 3.4 (L)  10/03/2014 3.9       Estimated Creatinine Clearance: 74.7 mL/min (A) (by C-G formula based on SCr of 1.09 mg/dL (H)).  Goal of Therapy:  K = 3.5 - 5 Mg = 1.7 - 2.4  Plan:   10/29 1130 K = 2.7, Mg = 1.5. Patient is receiving potassium chloride 62mEq daily. Will add one time potassium chloride packet of 49mEq and one time dose of magnesium sulfate 2g IV. Will recheck K approximately 2 hours after oral dose given and Mg with AM labs on 10/30.   10/29 1500 K = 3.4. Will replace with another oral potassium chloride packet of 57mEq and recheck with AM labs.   Lendon Ka, PharmD Pharmacy Resident 07/03/2017,11:23 AM

## 2017-07-03 NOTE — Care Management Note (Addendum)
Case Management Note  Patient Details  Name: Brittany Nguyen MRN: [id number] Date of Birth: 1965-10-18  Subjective/Objective:                 Patient admitted to icu stepdown with diagnosis of sepsis and need for continuous bipap. She has just been placed on nasal cannula. Need for oxygen is acute.  Has cpap at home.  Action/Plan:  Follow home oxygen assessment.  Expected Discharge Date:                  Expected Discharge Plan:     In-House Referral:     Discharge planning Services     Post Acute Care Choice:    Choice offered to:     DME Arranged:    DME Agency:     HH Arranged:    HH Agency:     Status of Service:     If discussed at [employer], dates discussed:    Additional Comments:  Katrina Stack, RN 07/03/2017, 9:16 AM

## 2017-07-03 NOTE — Progress Notes (Signed)
+  STREP throat INFLUENZA  negative Sepsis and LA improving slowly VS reviewed On RA Not on biPAP  Plan 1.cotninue abx 2.motrin for pain 3.transfer to gen med floor  Does NOT meet ICU/SD criteria   Corrin Parker, M.D.  [facility]  Medical Director [place] Director [facility] Cardio-Pulmonary Department

## 2017-07-03 NOTE — ED Notes (Signed)
CRITICAL LAB: POTASSIUM is 2.7, [place],Lab, Dr. Dahlia Client notified, orders received

## 2017-07-03 NOTE — Progress Notes (Signed)
Pt taken off BIPAP, CCU Md notified, placed on 2lpm [place], respiratory rate 22/min, pt in no respiratory distress, will continue to monitor

## 2017-07-03 NOTE — Progress Notes (Signed)
Report called to [facility] on 1A, pt will transport to room 135 via wheelchair, A&O x 4, VSS. Meds, chart, and belongings will transfer with her.

## 2017-07-03 NOTE — ED Triage Notes (Signed)
Patient reports symptoms started at noon yesterday with fever (103.6 at home) and generalized body aches.  Reports she has been vomiting since 7 pm.

## 2017-07-03 NOTE — ED Provider Notes (Signed)
[facility] Emergency Department Provider Note   ____________________________________________   First MD Initiated Contact with Patient 07/03/17 [phone]     (approximate)  I have reviewed the triage vital signs and the nursing notes.   HISTORY  Chief Complaint Fever and Generalized Body Aches    HPI Brittany Nguyen is a 51 y.o. female Who comes into the hospital today with high fever bodyaches vomiting decreased appetite and inability to keep down fluids. The patient's husband states that the symptoms started around noon yesterday. She did attempt to 103.8 at home. He is been giving her Tylenol as she is unable to take ibuprofen due to her heart issues. He reports that he thinks the patient last received Tylenol around 3 hours ago. The patient has had a cough with some runny nose and sneezing. She also has really sore throat. The patient's husband is unsure she's had any sick contacts but he said that that is always possible. The patient was feeling really bad at home so they decided to bring her in. She states that she does have some diffuse abdominal cramping with no distinct pain at this time. She is here today for evaluation.she has no chest pain and denies any shortness of breath.   Past Medical History:  Diagnosis Date  . Asthma 2013  . CHF (congestive heart failure) ([place])   . Diverticulitis 2010  . DVT (deep venous thrombosis) ([place])   . Endometriosis 1990  . Heart disease 2013  . Hx MRSA infection   . Lump or mass in breast   . Restless leg     Patient Active Problem List   Diagnosis Date Noted  . Sepsis ([place]) 07/03/2017  . COPD (chronic obstructive pulmonary disease) ([place]) 04/26/2017  . Community acquired pneumonia 04/25/2017  . Chronic systolic heart failure ([place]) 02/03/2017  . Hypotension 02/03/2017  . Chest pain 12/09/2016  . RLS (restless legs syndrome) 12/09/2016  . Cough, persistent 11/22/2015  . Hypokalemia 11/22/2015  .  Leukocytosis 11/22/2015  . Dyspnea 11/21/2015  . Respiratory distress 09/19/2015  . Breast pain 01/11/2013  . Hx MRSA infection   . Lump or mass in breast   . GOITER, MULTINODULAR 01/20/2009  . HYPOGLYCEMIA, UNSPECIFIED 01/20/2009  . GERD 01/20/2009  . DIVERTICULITIS OF COLON 01/20/2009    Past Surgical History:  Procedure Laterality Date  . ABDOMINAL HYSTERECTOMY     age 26  . CARDIAC DEFIBRILLATOR PLACEMENT    . CESAREAN SECTION    . CHOLECYSTECTOMY    . COLECTOMY    . NASAL SINUS SURGERY  2012    Prior to Admission medications   Medication Sig Start Date End Date Taking? Authorizing Provider  albuterol (PROVENTIL HFA;VENTOLIN HFA) 108 (90 Base) MCG/ACT inhaler Inhale 2 puffs into the lungs as directed. 08/28/16 08/28/17 Yes [provider]  beclomethasone (QVAR) 80 MCG/ACT inhaler Inhale 2 puffs into the lungs 2 (two) times daily. 06/05/17 06/05/18 Yes Laverle Hobby, MD  cyanocobalamin 500 MCG tablet Take 500 mcg by mouth daily.   Yes [provider]  furosemide (LASIX) 40 MG tablet Take 80 mg by mouth 2 (two) times daily.    Yes [provider]  losartan (COZAAR) 25 MG tablet Take 25 mg by mouth daily.    Yes [provider]  metoprolol succinate (TOPROL-XL) 25 MG 24 hr tablet Take 25 mg by mouth 2 (two) times daily. Total 75 mg daily 06/07/17  Yes [provider]  nitroGLYCERIN (NITROSTAT) 0.4 MG SL tablet  Place 1 tablet (0.4 mg total) under the tongue every 5 (five) minutes as needed for chest pain. 01/24/17  Yes Sainani, Belia Heman, MD  rOPINIRole (REQUIP) 2 MG tablet Take 1 tablet (2 mg total) by mouth at bedtime. 2-4  Mg per night 04/26/17  Yes Mody, Sital, MD  spironolactone (ALDACTONE) 25 MG tablet Take 0.5 tablets (12.5 mg total) by mouth daily. Patient taking differently: Take 25 mg by mouth daily.  01/31/17  Yes [name], MD  traMADol (ULTRAM) 50 MG tablet Take 50 mg by mouth as needed.   Yes [provider]    aspirin 81 MG chewable tablet Chew 1 tablet (81 mg total) by mouth daily. Patient not taking: Reported on 07/03/2017 12/09/16   [name], MD  HYDROcodone-acetaminophen (NORCO/VICODIN) 5-325 MG tablet Take 1 tablet by mouth every 4 (four) hours as needed. Patient not taking: Reported on 07/03/2017 06/18/17   Harvest Dark, MD  metoprolol succinate (TOPROL-XL) 50 MG 24 hr tablet Take 50 mg by mouth daily. Total 75 mg daily.Take with or immediately following a meal.    [provider]  potassium chloride SA (K-DUR,KLOR-CON) 20 MEQ tablet Take 1 tablet by mouth daily. 06/05/17   [provider]  QVAR REDIHALER 80 MCG/ACT inhaler INHALE 2 PUFFS PO BID 06/12/17   [provider]  spironolactone (ALDACTONE) 50 MG tablet Take 1 tablet by mouth daily. 06/13/17   [provider]    Allergies Contrast media [iodinated diagnostic agents]; Metrizamide; Penicillins; Isosorbide nitrate; Latex; Ondansetron; Povidone-iodine; and Pulmicort [budesonide]  Family History  Problem Relation Age of Onset  . Cancer Mother 30       ovarian  . Cancer Father 57       brain  . Cancer Daughter 18       skin  . Cancer Maternal Aunt 15       breast    Social History Social History  Substance Use Topics  . Smoking status: Never Smoker  . Smokeless tobacco: Never Used  . Alcohol use No    Review of Systems  Constitutional:  fever/chills Eyes: No visual changes. ENT:  sore throat. Cardiovascular: Denies chest pain. Respiratory: cough Gastrointestinal: abdominal cramping, nausea, vomiting Genitourinary: Negative for dysuria. Musculoskeletal: muscle aches Skin: Negative for rash. Neurological: Negative for headaches, focal weakness or numbness.   ____________________________________________   PHYSICAL EXAM:  VITAL SIGNS: ED Triage Vitals  Enc Vitals Group     BP 07/03/17 0338 104/76     Pulse Rate 07/03/17 0338 (!) 122     Resp 07/03/17 0338 (!) 24      Temp 07/03/17 0338 (!) 101.1 F (38.4 C)     Temp Source 07/03/17 0338 Oral     SpO2 07/03/17 0338 97 %     Weight --      Height --      Head Circumference --      Peak Flow --      Pain Score 07/03/17 0412 10     Pain Loc --      Pain Edu? --      Excl. in [place]? --     Constitutional: Alert and oriented. Ill appearing and in moderate distress. Eyes: Conjunctivae are normal. PERRL. EOMI. Head: Atraumatic. Nose: No congestion/rhinnorhea. Mouth/Throat: Mucous membranes are moist.  Oropharynx erythematous with some exudates Neck: No stridor. Cardiovascular: tachycardia, regular rhythm. Grossly normal heart sounds.  Good peripheral circulation. Respiratory: tachypnea.  No retractions. Lungs CTAB. Gastrointestinal: Soft and nontender. No  distention. positive bowel sounds Musculoskeletal: No lower extremity tenderness nor edema.   Neurologic:  Normal speech and language.  Skin:  Skin is warm, dry and intact.  Psychiatric: Mood and affect are normal.  ____________________________________________   LABS (all labs ordered are listed, but only abnormal results are displayed)  Labs Reviewed  COMPREHENSIVE METABOLIC PANEL - Abnormal; Notable for the following:       Result Value   Potassium 2.7 (*)    Chloride 96 (*)    Glucose, Bld 149 (*)    Creatinine, Ser 1.09 (*)    Total Protein 8.3 (*)    GFR calc non Af Amer 59 (*)    All other components within normal limits  CBC WITH DIFFERENTIAL/PLATELET - Abnormal; Notable for the following:    WBC 21.2 (*)    MCV 77.4 (*)    RDW 18.1 (*)    Neutro Abs 19.6 (*)    Lymphs Abs 0.8 (*)    Basophils Absolute 0.2 (*)    All other components within normal limits  LACTIC ACID, PLASMA - Abnormal; Notable for the following:    Lactic Acid, Venous 3.6 (*)    All other components within normal limits  POCT RAPID STREP A - Abnormal; Notable for the following:    Streptococcus, Group A Screen (Direct) POSITIVE (*)    All other components  within normal limits  CULTURE, BLOOD (ROUTINE X 2)  CULTURE, BLOOD (ROUTINE X 2)  INFLUENZA PANEL BY PCR (TYPE A & B)  LACTIC ACID, PLASMA  URINALYSIS, COMPLETE (UACMP) WITH MICROSCOPIC  CBC  CREATININE, SERUM   ____________________________________________  EKG  ED ECG REPORT I, Loney Hering, the attending physician, personally viewed and interpreted this ECG.   Date: 07/03/2017  EKG Time: 457  Rate: 116  Rhythm: normal sinus rhythm  Axis: left axis deviation  Intervals:none  ST&T Change: none  ____________________________________________  RADIOLOGY  Dg Chest 2 View  Result Date: 07/03/2017 CLINICAL DATA:  Fever and body aches. EXAM: CHEST  2 VIEW COMPARISON:  Radiograph 06/18/2017 FINDINGS: Left-sided pacemaker in place, unchanged in position. The cardiomediastinal contours are normal. The lungs are clear. Pulmonary vasculature is normal. No consolidation, pleural effusion, or pneumothorax. No acute osseous abnormalities are seen. IMPRESSION: No acute pulmonary process. Electronically Signed   By: Jeb Levering M.D.   On: 07/03/2017 04:24    ____________________________________________   PROCEDURES  Procedure(s) performed: None  Procedures  Critical Care performed: Yes, see critical care note(s)   CRITICAL CARE Performed by: Charlesetta Ivory P   Total critical care time: 45 minutes  Critical care time was exclusive of separately billable procedures and treating other patients.  Critical care was necessary to treat or prevent imminent or life-threatening deterioration.  Critical care was time spent personally by me on the following activities: development of treatment plan with patient and/or surrogate as well as nursing, discussions with consultants, evaluation of patient's response to treatment, examination of patient, obtaining history from patient or surrogate, ordering and performing treatments and interventions, ordering and review of laboratory  studies, ordering and review of radiographic studies, pulse oximetry and re-evaluation of patient's condition.   ____________________________________________   INITIAL IMPRESSION / ASSESSMENT AND PLAN / ED COURSE  As part of my medical decision making, I reviewed the following data within the electronic MEDICAL RECORD NUMBER Notes from prior ED visits and [place] Controlled Substance Database  This is a 51 year old who comes into the hospital today with body aches fever  vomiting decreased appetite. The patient is tachycardic and febrile here in the emergency department.  My differential diagnosis includes sepsis, influenza, viral illness, UTI.  Looking at the patient she does appear septic. She does have some tachypnea as well as tachycardia and fever. I did start the septic protocol on the patient. She initially received a liter of normal saline and we did not treat her with Tylenol right away. The patient's husband states that he had given her some Tylenol previously. The patient did get a strep swab as well as an influenza swab. The patient strep did return positive. Her chest x-ray did not show any pneumonia and we are still awaiting urinalysis. Once the patient strep returned as did give her dose of clindamycin 600 mg IV. The patient does have a penicillin allergy which involves rash and shortness of breath. The patient's lactic acid did return and I also gave her a second liter of normal saline. While the patient states that she does not feel short of breath she did become more tachypnea with a respiratory rate in the 30s. Given the patient's history of heart failure and her need for IV hydration I did decide to place the patient on BiPAP. The BiPAP did help to decrease the patient's work of breathing and her respiratory rate decreased into the 20s. As the patient appears to be septic with an elevated white blood cell count of 21,000 and a positive strep culture and lactic acid that is elevated I will  admit the patient to the hospitalist service. She is mentating well. She reports that her abdominal cramping did improve after the fluids and she has not had any vomiting in the emergency department. The patient will be admitted to the hospitalist service.      ED Sepsis - Repeat Assessment   Performed at:    0710  Last Vitals:    Blood pressure 126/85, pulse (!) 113, temperature (!) 101.1 F (38.4 C), temperature source Oral, resp. rate 20, last menstrual period 01/22/2012, SpO2 96 %.  Heart:                  Tachycardia, rrr, no m/r/g  Lungs:     CTAB, no crackles auscultated  Capillary Refill:   < 2 sec  Peripheral Pulse (include location): Radial 2+   Skin (include color):   Warm, pale, dry   ____________________________________________   FINAL CLINICAL IMPRESSION(S) / ED DIAGNOSES  Final diagnoses:  Strep pharyngitis  Body aches  Sepsis, due to unspecified organism [facility])      NEW MEDICATIONS STARTED DURING THIS VISIT:  New Prescriptions   No medications on file     Note:  This document was prepared using Dragon voice recognition software and may include unintentional dictation errors.    Loney Hering, MD 07/03/17 [phone]

## 2017-07-03 NOTE — Progress Notes (Signed)
Patient complaining of a migraine. MD notified Orders received

## 2017-07-03 NOTE — Progress Notes (Signed)
The patient has sore throat, no shortness breath, off BiPAP. Vital signs are stable. Continue current treatment.  Time spent about 30 minutes.

## 2017-07-04 LAB — BASIC METABOLIC PANEL
ANION GAP: 5 (ref 5–15)
BUN: 8 mg/dL (ref 6–20)
CALCIUM: 8.5 mg/dL — AB (ref 8.9–10.3)
CHLORIDE: 110 mmol/L (ref 101–111)
CO2: 25 mmol/L (ref 22–32)
Creatinine, Ser: 0.92 mg/dL (ref 0.44–1.00)
GFR calc Af Amer: >60 mL/min (ref >60)
GFR calc non Af Amer: >60 mL/min (ref >60)
GLUCOSE: 122 mg/dL — AB (ref 65–99)
POTASSIUM: 3.7 mmol/L (ref 3.5–5.1)
Sodium: 140 mmol/L (ref 135–145)

## 2017-07-04 LAB — CBC
HEMATOCRIT: 31.1 % — AB (ref 35.0–47.0)
HEMOGLOBIN: 10.1 g/dL — AB (ref 12.0–16.0)
MCH: 25.4 pg — AB (ref 26.0–34.0)
MCHC: 32.4 g/dL (ref 32.0–36.0)
MCV: 78.3 fL — AB (ref 80.0–100.0)
Platelets: 195 10*3/uL (ref 150–440)
RBC: 3.97 MIL/uL (ref 3.80–5.20)
RDW: 18.4 % — ABNORMAL HIGH (ref 11.5–14.5)
WBC: 16.2 10*3/uL — ABNORMAL HIGH (ref 3.6–11.0)

## 2017-07-04 LAB — MAGNESIUM: Magnesium: 2 mg/dL (ref 1.7–2.4)

## 2017-07-04 MED ORDER — IBUPROFEN 400 MG PO TABS
400.0000 mg | ORAL_TABLET | Freq: Four times a day (QID) | ORAL | Status: DC | PRN
  Administered 2017-07-04 – 2017-07-05 (×4): 400 mg via ORAL
  Filled 2017-07-04 (×4): qty 1

## 2017-07-04 NOTE — Progress Notes (Signed)
MEDICATION RELATED CONSULT NOTE - Electrolytes  Pharmacy Consult for Electrolytes Indication: Hypokalemia  Patient Measurements: Height: 5\' 5"  (165.1 cm) Weight: 229 lb 8 oz (104.1 kg) IBW/kg (Calculated) : 57   Labs: Sodium (mmol/L)  Date Value  07/04/2017 140  10/03/2014 139   Potassium (mmol/L)  Date Value  07/04/2017 3.7  10/03/2014 3.9   Magnesium (mg/dL)  Date Value  07/04/2017 2.0  11/07/2013 2.0   Phosphorus (mg/dL)  Date Value  01/29/2017 4.3    Calcium (mg/dL)  Date Value  07/04/2017 8.5 (L)   Calcium, Total (mg/dL)  Date Value  10/03/2014 9.0   Potassium (mmol/L)  Date Value  07/04/2017 3.7  10/03/2014 3.9       Estimated Creatinine Clearance: 88.5 mL/min (by C-G formula based on SCr of 0.92 mg/dL).  Goal of Therapy:  K = 3.5 - 5 Mg = 1.7 - 2.4  Plan:  All labs within normal limits.  Larene Beach, PharmD

## 2017-07-04 NOTE — Progress Notes (Signed)
[place] [facility] [place] NAME: Brittany Nguyen    MR#:  [id number]  DATE OF BIRTH:  06/29/1966  SUBJECTIVE:  CHIEF COMPLAINT:   Chief Complaint  Patient presents with  . Fever  . Generalized Body Aches   High fever of 103 last night. REVIEW OF SYSTEMS:  Review of Systems  Constitutional: Positive for chills, fever and malaise/fatigue.  HENT: Positive for sore throat.   Eyes: Negative for blurred vision and double vision.  Respiratory: Negative for cough, hemoptysis, shortness of breath, wheezing and stridor.   Cardiovascular: Negative for chest pain, palpitations, orthopnea and leg swelling.  Gastrointestinal: Negative for abdominal pain, blood in stool, diarrhea, melena, nausea and vomiting.  Genitourinary: Negative for dysuria, flank pain and hematuria.  Musculoskeletal: Negative for back pain and joint pain.  Skin: Negative for rash.  Neurological: Negative for dizziness, sensory change, focal weakness, seizures, loss of consciousness, weakness and headaches.  Endo/Heme/Allergies: Negative for polydipsia.  Psychiatric/Behavioral: Negative for depression. The patient is not nervous/anxious.     DRUG ALLERGIES:   Allergies  Allergen Reactions  . Contrast Media [Iodinated Diagnostic Agents] Shortness Of Breath  . Metrizamide Shortness Of Breath  . Penicillins Hives and Other (See Comments)    Other reaction(s): Other (See Comments) Has patient had a PCN reaction causing immediate rash, facial/tongue/throat swelling, SOB or lightheadedness with hypotension: Yes Has patient had a PCN reaction causing severe rash involving mucus membranes or skin necrosis: No Has patient had a PCN reaction that required hospitalization No Has patient had a PCN reaction occurring within the last 10 years: No If all of the above answers are "NO", then may proceed with Cephalosporin use. Has patient had a PCN reaction causing immediate rash,  facial/tongue/throat swelling, SOB or lightheadedness with hypotension: Yes Has patient had a PCN reaction causing severe rash involving mucus membranes or skin necrosis: No Has patient had a PCN reaction that required hospitalization No Has patient had a PCN reaction occurring within the last 10 years: No If all of the above answers are "NO", then may proceed with Cephalosporin use.   . Isosorbide Nitrate     Other reaction(s): Headache  . Latex Hives  . Ondansetron Other (See Comments)    Severe headache  . Povidone-Iodine Rash    Blistering rash  . Pulmicort [Budesonide] Itching   VITALS:  Blood pressure 118/72, pulse (!) 102, temperature 98.8 F (37.1 C), temperature source Oral, resp. rate 18, height 5\' 5"  (1.651 m), weight 229 lb 8 oz (104.1 kg), last menstrual period 01/22/2012, SpO2 100 %. PHYSICAL EXAMINATION:  Physical Exam  Constitutional: She is oriented to person, place, and time and well-developed, well-nourished, and in no distress.  HENT:  Head: Normocephalic.  Mouth/Throat: Oropharynx is clear and moist.  Eyes: Pupils are equal, round, and reactive to light. Conjunctivae and EOM are normal. No scleral icterus.  Neck: Normal range of motion. Neck supple. No JVD present. No tracheal deviation present.  Cardiovascular: Normal rate, regular rhythm and normal heart sounds.  Exam reveals no gallop.   No murmur heard. Pulmonary/Chest: Effort normal and breath sounds normal. No respiratory distress. She has no wheezes. She has no rales.  Abdominal: Soft. Bowel sounds are normal. She exhibits no distension. There is no tenderness. There is no rebound.  Musculoskeletal: Normal range of motion. She exhibits no edema or tenderness.  Neurological: She is alert and oriented to person, place, and time. No cranial nerve deficit.  Skin: No  rash noted. No erythema.  Psychiatric: Affect normal.   LABORATORY PANEL:  Female CBC  Recent Labs Lab 07/04/17 0359  WBC 16.2*  HGB 10.1*   HCT 31.1*  PLT 195   ------------------------------------------------------------------------------------------------------------------ Chemistries   Recent Labs Lab 07/03/17 0410  07/04/17 0359  NA 136  --  140  K 2.7*  < > 3.7  CL 96*  --  110  CO2 27  --  25  GLUCOSE 149*  --  122*  BUN 10  --  8  CREATININE 1.09*  --  0.92  CALCIUM 9.2  --  8.5*  MG 1.5*  --  2.0  AST 30  --   --   ALT 21  --   --   ALKPHOS 96  --   --   BILITOT 0.6  --   --   < > = values in this interval not displayed. RADIOLOGY:  No results found. ASSESSMENT AND PLAN:   51 year old female patient with history of congestive heart failure, bronchial asthma, diverticular disease, DVT presented to the emergency room with sore throat, body aches fever. Lactic acid level was elevated and WBC count was also elevated. Patient also had shortness of breath and was put on BiPAP.  1. Sepsis due to Streptococcal pharyngitis Still leukocytosis. Continue clindamycin, follow-up CBC and Bloodcultures.  2. Acute respiratory failure with hypoxia due to above. Improved.  3. Hypokalemia. Improved with supplement.  Hypomagnesemia.Improved with supplement.  4. Chronic systolic congestive heart failure. LV EF: 30% -   35% stable. 5. Morbid obesity.  All the records are reviewed and case discussed with Care Management/Social Worker. Management plans discussed with the patient, family and they are in agreement.  CODE STATUS: Full Code  TOTAL TIME TAKING CARE OF THIS PATIENT: 33 minutes.   More than 50% of the time was spent in counseling/coordination of care: YES  POSSIBLE D/C IN 2 DAYS, DEPENDING ON CLINICAL CONDITION.   [name] M.D on 07/04/2017 at 3:27 PM  Between 7am to 6pm - Pager - [phone]  After 6pm go to [email or web address] - [facility]

## 2017-07-05 LAB — CBC
HCT: 26.9 % — ABNORMAL LOW (ref 35.0–47.0)
HEMOGLOBIN: 9 g/dL — AB (ref 12.0–16.0)
MCH: 26.3 pg (ref 26.0–34.0)
MCHC: 33.5 g/dL (ref 32.0–36.0)
MCV: 78.5 fL — ABNORMAL LOW (ref 80.0–100.0)
PLATELETS: 207 10*3/uL (ref 150–440)
RBC: 3.43 MIL/uL — ABNORMAL LOW (ref 3.80–5.20)
RDW: 19 % — ABNORMAL HIGH (ref 11.5–14.5)
WBC: 10.9 10*3/uL (ref 3.6–11.0)

## 2017-07-05 MED ORDER — CLINDAMYCIN HCL 300 MG PO CAPS: 300.0000 mg | ORAL_CAPSULE | Freq: Three times a day (TID) | ORAL | 0 refills | Status: AC

## 2017-07-05 MED ORDER — ALBUTEROL SULFATE (2.5 MG/3ML) 0.083% IN NEBU
3.0000 mL | INHALATION_SOLUTION | RESPIRATORY_TRACT | Status: DC | PRN
  Administered 2017-07-05: 3 mL via RESPIRATORY_TRACT
  Filled 2017-07-05: qty 3

## 2017-07-05 MED ORDER — OXYCODONE-ACETAMINOPHEN 5-325 MG PO TABS: 1.0000 | ORAL_TABLET | Freq: Four times a day (QID) | ORAL | 0 refills | Status: DC | PRN

## 2017-07-05 NOTE — Discharge Summary (Signed)
[facility] NAME: Brittany Nguyen    MR#:  [id number]  DATE OF BIRTH:  1966/08/29  DATE OF ADMISSION:  07/03/2017 ADMITTING PHYSICIAN: Saundra Shelling, MD  DATE OF DISCHARGE: 07/05/2017  PRIMARY CARE PHYSICIAN: [name], MD    ADMISSION DIAGNOSIS:  Body aches [R52] Strep pharyngitis [J02.0] Sepsis, due to unspecified organism ([place]) [A41.9]  DISCHARGE DIAGNOSIS:  Active Problems:   Sepsis ([place])   SECONDARY DIAGNOSIS:   Past Medical History:  Diagnosis Date  . Asthma 2013  . CHF (congestive heart failure) ([place])   . Diverticulitis 2010  . DVT (deep venous thrombosis) ([place])   . Endometriosis 1990  . Heart disease 2013  . Hx MRSA infection   . Lump or mass in breast   . Restless leg     HOSPITAL COURSE:    51 year old female patient with history of congestive heart failure, bronchial asthma, diverticular disease, DVT presented to the emergency room with sore throat, body aches fever.Lactic acid level was elevated and WBC count was also elevated.  1. Sepsis : Patient presented with elevated WBC and fever due to Strep A pharyngitis. Her symptoms have improved. Her blood cultures are negative. She is allergic to penicillin and therefore is started on vancomycin. She will continue this for 10 more days. She'll follow up with her PCP.  2. Acute hypoxic respiratory failure: This is improved 3. Hypokalemia: This was repleted and improved.  4. Chronic systolic heart failure EF of 30-35%: Patient had no signs of exacerbation and will continue outpatient medications.  DISCHARGE CONDITIONS AND DIET:   Patient stable for discharge on heart healthy diet  CONSULTS OBTAINED:    DRUG ALLERGIES:   Allergies  Allergen Reactions  . Contrast Media [Iodinated Diagnostic Agents] Shortness Of Breath  . Metrizamide Shortness Of Breath  . Penicillins Hives and Other (See Comments)    Other reaction(s): Other (See Comments) Has  patient had a PCN reaction causing immediate rash, facial/tongue/throat swelling, SOB or lightheadedness with hypotension: Yes Has patient had a PCN reaction causing severe rash involving mucus membranes or skin necrosis: No Has patient had a PCN reaction that required hospitalization No Has patient had a PCN reaction occurring within the last 10 years: No If all of the above answers are "NO", then may proceed with Cephalosporin use. Has patient had a PCN reaction causing immediate rash, facial/tongue/throat swelling, SOB or lightheadedness with hypotension: Yes Has patient had a PCN reaction causing severe rash involving mucus membranes or skin necrosis: No Has patient had a PCN reaction that required hospitalization No Has patient had a PCN reaction occurring within the last 10 years: No If all of the above answers are "NO", then may proceed with Cephalosporin use.   . Isosorbide Nitrate     Other reaction(s): Headache  . Latex Hives  . Ondansetron Other (See Comments)    Severe headache  . Povidone-Iodine Rash    Blistering rash  . Pulmicort [Budesonide] Itching    DISCHARGE MEDICATIONS:   Current Discharge Medication List    START taking these medications   Details  clindamycin (CLEOCIN) 300 MG capsule Take 1 capsule (300 mg total) by mouth 3 (three) times daily. Qty: 30 capsule, Refills: 0    oxyCODONE-acetaminophen (PERCOCET/ROXICET) 5-325 MG tablet Take 1 tablet by mouth every 6 (six) hours as needed for moderate pain. Qty: 10 tablet, Refills: 0      CONTINUE these medications which have NOT CHANGED   Details  albuterol (PROVENTIL HFA;VENTOLIN HFA) 108 (90 Base) MCG/ACT inhaler Inhale 2 puffs into the lungs as directed.    beclomethasone (QVAR) 80 MCG/ACT inhaler Inhale 2 puffs into the lungs 2 (two) times daily. Qty: 1 Inhaler, Refills: 5    cyanocobalamin 500 MCG tablet Take 500 mcg by mouth daily.    furosemide (LASIX) 40 MG tablet Take 80 mg by mouth 2 (two)  times daily.     losartan (COZAAR) 25 MG tablet Take 25 mg by mouth daily.     !! metoprolol succinate (TOPROL-XL) 25 MG 24 hr tablet Take 25 mg by mouth 2 (two) times daily. Total 75 mg daily Refills: 11    nitroGLYCERIN (NITROSTAT) 0.4 MG SL tablet Place 1 tablet (0.4 mg total) under the tongue every 5 (five) minutes as needed for chest pain. Qty: 90 tablet, Refills: 1    rOPINIRole (REQUIP) 2 MG tablet Take 1 tablet (2 mg total) by mouth at bedtime. 2-4  Mg per night    spironolactone (ALDACTONE) 25 MG tablet Take 0.5 tablets (12.5 mg total) by mouth daily. Qty: 30 tablet, Refills: 0    !! metoprolol succinate (TOPROL-XL) 50 MG 24 hr tablet Take 50 mg by mouth daily. Total 75 mg daily.Take with or immediately following a meal.    potassium chloride SA (K-DUR,KLOR-CON) 20 MEQ tablet Take 1 tablet by mouth daily. Refills: 10    QVAR REDIHALER 80 MCG/ACT inhaler INHALE 2 PUFFS PO BID Refills: 5     !! - Potential duplicate medications found. Please discuss with provider.    STOP taking these medications     traMADol (ULTRAM) 50 MG tablet      aspirin 81 MG chewable tablet      HYDROcodone-acetaminophen (NORCO/VICODIN) 5-325 MG tablet           Today   CHIEF COMPLAINT:   Patient doing much better this point. No fevers overnight. Sore throat has improved.   VITAL SIGNS:  Blood pressure 115/75, pulse (!) 101, temperature 98.1 F (36.7 C), temperature source Oral, resp. rate 18, height 5\' 5"  (1.651 m), weight 104.1 kg (229 lb 8 oz), last menstrual period 01/22/2012, SpO2 98 %.   REVIEW OF SYSTEMS:  Review of Systems  Constitutional: Negative.  Negative for chills, fever and malaise/fatigue.  HENT: Negative.  Negative for ear discharge, ear pain, hearing loss, nosebleeds and sore throat (improved).   Eyes: Negative.  Negative for blurred vision and pain.  Respiratory: Negative.  Negative for cough, hemoptysis, shortness of breath and wheezing.   Cardiovascular:  Negative.  Negative for chest pain, palpitations and leg swelling.  Gastrointestinal: Negative.  Negative for abdominal pain, blood in stool, diarrhea, nausea and vomiting.  Genitourinary: Negative.  Negative for dysuria.  Musculoskeletal: Negative.  Negative for back pain.  Skin: Negative.   Neurological: Negative for dizziness, tremors, speech change, focal weakness, seizures and headaches.  Endo/Heme/Allergies: Negative.  Does not bruise/bleed easily.  Psychiatric/Behavioral: Negative.  Negative for depression, hallucinations and suicidal ideas.     PHYSICAL EXAMINATION:  GENERAL:  51 y.o.-year-old patient lying in the bed with no acute distress.  NECK:  Supple, no jugular venous distention. No thyroid enlargement, no tenderness. Throat: left exudate on tonsils noted less erythema  LUNGS: Normal breath sounds bilaterally, no wheezing, rales,rhonchi  No use of accessory muscles of respiration.  CARDIOVASCULAR: S1, S2 normal. No murmurs, rubs, or gallops.  ABDOMEN: Soft, non-tender, non-distended. Bowel sounds present. No organomegaly or mass.  EXTREMITIES: No pedal edema, cyanosis, or  clubbing.  PSYCHIATRIC: The patient is alert and oriented x 3.  SKIN: No obvious rash, lesion, or ulcer.   DATA REVIEW:   CBC  Recent Labs Lab 07/05/17 0442  WBC 10.9  HGB 9.0*  HCT 26.9*  PLT 207    Chemistries   Recent Labs Lab 07/03/17 0410  07/04/17 0359  NA 136  --  140  K 2.7*  < > 3.7  CL 96*  --  110  CO2 27  --  25  GLUCOSE 149*  --  122*  BUN 10  --  8  CREATININE 1.09*  --  0.92  CALCIUM 9.2  --  8.5*  MG 1.5*  --  2.0  AST 30  --   --   ALT 21  --   --   ALKPHOS 96  --   --   BILITOT 0.6  --   --   < > = values in this interval not displayed.  Cardiac Enzymes No results for input(s): TROPONINI in the last 168 hours.  Microbiology Results  @MICRORSLT48 @  RADIOLOGY:  No results found.    Current Discharge Medication List    START taking these medications    Details  clindamycin (CLEOCIN) 300 MG capsule Take 1 capsule (300 mg total) by mouth 3 (three) times daily. Qty: 30 capsule, Refills: 0    oxyCODONE-acetaminophen (PERCOCET/ROXICET) 5-325 MG tablet Take 1 tablet by mouth every 6 (six) hours as needed for moderate pain. Qty: 10 tablet, Refills: 0      CONTINUE these medications which have NOT CHANGED   Details  albuterol (PROVENTIL HFA;VENTOLIN HFA) 108 (90 Base) MCG/ACT inhaler Inhale 2 puffs into the lungs as directed.    beclomethasone (QVAR) 80 MCG/ACT inhaler Inhale 2 puffs into the lungs 2 (two) times daily. Qty: 1 Inhaler, Refills: 5    cyanocobalamin 500 MCG tablet Take 500 mcg by mouth daily.    furosemide (LASIX) 40 MG tablet Take 80 mg by mouth 2 (two) times daily.     losartan (COZAAR) 25 MG tablet Take 25 mg by mouth daily.     !! metoprolol succinate (TOPROL-XL) 25 MG 24 hr tablet Take 25 mg by mouth 2 (two) times daily. Total 75 mg daily Refills: 11    nitroGLYCERIN (NITROSTAT) 0.4 MG SL tablet Place 1 tablet (0.4 mg total) under the tongue every 5 (five) minutes as needed for chest pain. Qty: 90 tablet, Refills: 1    rOPINIRole (REQUIP) 2 MG tablet Take 1 tablet (2 mg total) by mouth at bedtime. 2-4  Mg per night    spironolactone (ALDACTONE) 25 MG tablet Take 0.5 tablets (12.5 mg total) by mouth daily. Qty: 30 tablet, Refills: 0    !! metoprolol succinate (TOPROL-XL) 50 MG 24 hr tablet Take 50 mg by mouth daily. Total 75 mg daily.Take with or immediately following a meal.    potassium chloride SA (K-DUR,KLOR-CON) 20 MEQ tablet Take 1 tablet by mouth daily. Refills: 10    QVAR REDIHALER 80 MCG/ACT inhaler INHALE 2 PUFFS PO BID Refills: 5     !! - Potential duplicate medications found. Please discuss with provider.    STOP taking these medications     traMADol (ULTRAM) 50 MG tablet      aspirin 81 MG chewable tablet      HYDROcodone-acetaminophen (NORCO/VICODIN) 5-325 MG tablet             Management plans discussed with the patient and she is in agreement. Stable  for discharge home  Patient should follow up with pcp  CODE STATUS:     Code Status Orders        Start     Ordered   07/03/17 0655  Full code  Continuous     07/03/17 0654    Code Status History    Date Active Date Inactive Code Status Order ID Comments User Context   04/25/2017  1:46 AM 04/26/2017  3:16 PM Full Code [id number]  [place], Ubaldo Glassing, DO ED   01/28/2017 10:33 AM 01/30/2017  5:41 PM Full Code [id number]  [name], MD Inpatient   01/22/2017  1:14 AM 01/24/2017  5:59 PM Full Code [id number]  Lance Coon, MD Inpatient   12/09/2016  2:09 AM 12/09/2016  3:07 PM Full Code [id number]  Lance Coon, MD Inpatient   11/21/2015  6:50 AM 11/22/2015  3:50 PM Full Code [id number]  Saundra Shelling, MD Inpatient   09/19/2015  5:11 AM 09/21/2015  6:29 PM Full Code [id number]  Harrie Foreman, MD Inpatient      TOTAL TIME TAKING CARE OF THIS PATIENT: 36 minutes.    Note: This dictation was prepared with Dragon dictation along with smaller phrase technology. Any transcriptional errors that result from this process are unintentional.  Hewitt Garner M.D on 07/05/2017 at 7:39 AM  Between 7am to 6pm - Pager - [phone] After 6pm go to [email or web address] - password [email or web address] [facility]  Office  [phone]  CC: Primary care physician; [name], MD

## 2017-07-05 NOTE — Progress Notes (Signed)
Discharge instructions and med details reviewed with patient. Printed AVS and printed prescription for Percocet given to patient. All questions answered. Patient was escorted out via wheelchair.  Wynema Birch, RN

## 2017-07-05 NOTE — Progress Notes (Signed)
Patient complaining of SOB. MD notified. Orders received

## 2017-07-05 NOTE — Progress Notes (Signed)
[place] [facility] [place] was admitted to the [facility] on 07/03/2017 and Discharged  07/05/2017 and should be excused from work/school   For 5 days starting 07/03/2017 , may return to work/school without any restrictions.  Call Bettey Costa MD with questions.  Wilbon Obenchain M.D on 07/05/2017,at 7:42 AM  [place] [facility]  [phone]

## 2017-07-05 NOTE — Care Management (Signed)
On room air. No home O2 needs

## 2017-07-08 LAB — CULTURE, BLOOD (ROUTINE X 2)
CULTURE: NO GROWTH
CULTURE: NO GROWTH
Special Requests: ADEQUATE

## 2017-07-29 ENCOUNTER — Other Ambulatory Visit: Payer: Self-pay

## 2017-07-29 ENCOUNTER — Encounter: Payer: Self-pay | Admitting: Emergency Medicine

## 2017-07-29 ENCOUNTER — Emergency Department
Admission: EM | Admit: 2017-07-29 | Discharge: 2017-07-29 | Disposition: A | Discharge status: Home / Self Care | Payer: Medicaid Other | Attending: Emergency Medicine | Admitting: Emergency Medicine

## 2017-07-29 DIAGNOSIS — J45909 Unspecified asthma, uncomplicated: Secondary | ICD-10-CM | POA: Insufficient documentation

## 2017-07-29 DIAGNOSIS — I5022 Chronic systolic (congestive) heart failure: Secondary | ICD-10-CM | POA: Insufficient documentation

## 2017-07-29 DIAGNOSIS — R21 Rash and other nonspecific skin eruption: Secondary | ICD-10-CM | POA: Diagnosis present

## 2017-07-29 DIAGNOSIS — Z79899 Other long term (current) drug therapy: Secondary | ICD-10-CM | POA: Diagnosis not present

## 2017-07-29 DIAGNOSIS — I959 Hypotension, unspecified: Secondary | ICD-10-CM | POA: Diagnosis not present

## 2017-07-29 DIAGNOSIS — Z9049 Acquired absence of other specified parts of digestive tract: Secondary | ICD-10-CM | POA: Diagnosis not present

## 2017-07-29 DIAGNOSIS — B029 Zoster without complications: Secondary | ICD-10-CM | POA: Diagnosis not present

## 2017-07-29 DIAGNOSIS — Z95810 Presence of automatic (implantable) cardiac defibrillator: Secondary | ICD-10-CM | POA: Diagnosis not present

## 2017-07-29 DIAGNOSIS — Z91040 Latex allergy status: Secondary | ICD-10-CM | POA: Insufficient documentation

## 2017-07-29 DIAGNOSIS — J449 Chronic obstructive pulmonary disease, unspecified: Secondary | ICD-10-CM | POA: Insufficient documentation

## 2017-07-29 MED ORDER — VALACYCLOVIR HCL 1 G PO TABS: 1000.0000 mg | ORAL_TABLET | Freq: Three times a day (TID) | ORAL | 0 refills | Status: AC

## 2017-07-29 MED ORDER — ACETAMINOPHEN-CODEINE 300-30 MG PO TABS: 1.0000 | ORAL_TABLET | ORAL | 0 refills | Status: AC | PRN

## 2017-07-29 NOTE — ED Triage Notes (Signed)
Pt noticed rash last night under breasts.  Pt states she thinks it is shingles.  Pt states it is itching and burning.  The rash does not appear to look like shingles to this RN.  The rash is under breasts bilaterally. There is no drainage no blisters. There is no rash on back either.

## 2017-07-29 NOTE — ED Notes (Signed)

## 2017-07-29 NOTE — ED Provider Notes (Signed)
[facility] Emergency Department Provider Note  ____________________________________________  Time seen: Approximately 7:45 PM  I have reviewed the triage vital signs and the nursing notes.   HISTORY  Chief Complaint Rash    HPI Brittany Nguyen is a 51 y.o. female presents to the emergency department with a macular rash following a dermatomal distribution beneath the left breast with vesicular formation and a prodrome of burning.  Patient has had one prior episode of zoster.  She denies chest pain, chest tightness, shortness of breath, nausea, vomiting and abdominal pain.  No alleviating measures have been attempted aside from Benadryl.   Past Medical History:  Diagnosis Date  . Asthma 2013  . CHF (congestive heart failure) ([place])   . Diverticulitis 2010  . DVT (deep venous thrombosis) ([place])   . Endometriosis 1990  . Heart disease 2013  . Hx MRSA infection   . Lump or mass in breast   . Restless leg     Patient Active Problem List   Diagnosis Date Noted  . Sepsis ([place]) 07/03/2017  . COPD (chronic obstructive pulmonary disease) ([place]) 04/26/2017  . Community acquired pneumonia 04/25/2017  . Chronic systolic heart failure ([place]) 02/03/2017  . Hypotension 02/03/2017  . Chest pain 12/09/2016  . RLS (restless legs syndrome) 12/09/2016  . Cough, persistent 11/22/2015  . Hypokalemia 11/22/2015  . Leukocytosis 11/22/2015  . Dyspnea 11/21/2015  . Respiratory distress 09/19/2015  . Breast pain 01/11/2013  . Hx MRSA infection   . Lump or mass in breast   . GOITER, MULTINODULAR 01/20/2009  . HYPOGLYCEMIA, UNSPECIFIED 01/20/2009  . GERD 01/20/2009  . DIVERTICULITIS OF COLON 01/20/2009    Past Surgical History:  Procedure Laterality Date  . ABDOMINAL HYSTERECTOMY     age 49  . CARDIAC DEFIBRILLATOR PLACEMENT    . CESAREAN SECTION    . CHOLECYSTECTOMY    . COLECTOMY    . NASAL SINUS SURGERY  2012    Prior to Admission medications    Medication Sig Start Date End Date Taking? Authorizing Provider  Acetaminophen-Codeine (TYLENOL/CODEINE #3) 300-30 MG tablet Take 1 tablet by mouth every 4 (four) hours as needed for up to 5 days for pain. 07/29/17 08/03/17  Lannie Fields, PA-C  albuterol (PROVENTIL HFA;VENTOLIN HFA) 108 (90 Base) MCG/ACT inhaler Inhale 2 puffs into the lungs as directed. 08/28/16 08/28/17  [provider]  beclomethasone (QVAR) 80 MCG/ACT inhaler Inhale 2 puffs into the lungs 2 (two) times daily. 06/05/17 06/05/18  Laverle Hobby, MD  cyanocobalamin 500 MCG tablet Take 500 mcg by mouth daily.    [provider]  furosemide (LASIX) 40 MG tablet Take 80 mg by mouth 2 (two) times daily.     [provider]  losartan (COZAAR) 25 MG tablet Take 25 mg by mouth daily.     [provider]  metoprolol succinate (TOPROL-XL) 25 MG 24 hr tablet Take 25 mg by mouth 2 (two) times daily. Total 75 mg daily 06/07/17   [provider]  metoprolol succinate (TOPROL-XL) 50 MG 24 hr tablet Take 50 mg by mouth daily. Total 75 mg daily.Take with or immediately following a meal.    [provider]  nitroGLYCERIN (NITROSTAT) 0.4 MG SL tablet Place 1 tablet (0.4 mg total) under the tongue every 5 (five) minutes as needed for chest pain. 01/24/17   Henreitta Leber, MD  oxyCODONE-acetaminophen (PERCOCET/ROXICET) 5-325 MG tablet Take 1 tablet by mouth every 6 (six) hours as needed for moderate pain. 07/05/17  Bettey Costa, MD  potassium chloride SA (K-DUR,KLOR-CON) 20 MEQ tablet Take 1 tablet by mouth daily. 06/05/17   [provider]  QVAR REDIHALER 80 MCG/ACT inhaler INHALE 2 PUFFS PO BID 06/12/17   [provider]  rOPINIRole (REQUIP) 2 MG tablet Take 1 tablet (2 mg total) by mouth at bedtime. 2-4  Mg per night 04/26/17   Bettey Costa, MD  spironolactone (ALDACTONE) 25 MG tablet Take 0.5 tablets (12.5 mg total) by mouth daily. Patient taking differently: Take 25 mg by  mouth daily.  01/31/17   [name], MD  valACYclovir (VALTREX) 1000 MG tablet Take 1 tablet (1,000 mg total) by mouth 3 (three) times daily for 7 days. 07/29/17 08/05/17  Lannie Fields, PA-C    Allergies Contrast media [iodinated diagnostic agents]; Metrizamide; Penicillins; Isosorbide nitrate; Latex; Ondansetron; Povidone-iodine; and Pulmicort [budesonide]  Family History  Problem Relation Age of Onset  . Cancer Mother 30       ovarian  . Cancer Father 72       brain  . Cancer Daughter 18       skin  . Cancer Maternal Aunt 44       breast    Social History Social History   Tobacco Use  . Smoking status: Never Smoker  . Smokeless tobacco: Never Used  Substance Use Topics  . Alcohol use: No  . Drug use: No     Review of Systems  Constitutional: No fever/chills Eyes: No visual changes. No discharge ENT: No upper respiratory complaints. Cardiovascular: no chest pain. Respiratory: no cough. No SOB. Gastrointestinal: No abdominal pain.  No nausea, no vomiting.  No diarrhea.  No constipation. Musculoskeletal: Negative for musculoskeletal pain. Skin: Patient has rash. Neurological: Negative for headaches, focal weakness or numbness.   ____________________________________________   PHYSICAL EXAM:  VITAL SIGNS: ED Triage Vitals [07/29/17 1732]  Enc Vitals Group     BP 109/64     Pulse Rate 99     Resp 18     Temp 98.1 F (36.7 C)     Temp Source Oral     SpO2 98 %     Weight 220 lb (99.8 kg)     Height 5\' 5"  (1.651 m)     Head Circumference      Peak Flow      Pain Score 8     Pain Loc      Pain Edu?      Excl. in [place]?      Constitutional: Alert and oriented. Well appearing and in no acute distress. Eyes: Conjunctivae are normal. PERRL. EOMI. Head: Atraumatic. Cardiovascular: Normal rate, regular rhythm. Normal S1 and S2.  Good peripheral circulation. Respiratory: Normal respiratory effort without tachypnea or retractions. Lungs CTAB. Good air entry to  the bases with no decreased or absent breath sounds. Musculoskeletal: Full range of motion to all extremities. No gross deformities appreciated. Neurologic:  Normal speech and language. No gross focal neurologic deficits are appreciated.  Skin: Patient has dry, macular rash with vesicular formation in a dermatomal distribution of the skin underneath the left breast. Psychiatric: Mood and affect are normal. Speech and behavior are normal. Patient exhibits appropriate insight and judgement.   ____________________________________________   LABS (all labs ordered are listed, but only abnormal results are displayed)  Labs Reviewed - No data to display ____________________________________________  EKG   ____________________________________________  RADIOLOGY  No results found.  ____________________________________________    PROCEDURES  Procedure(s) performed:    Procedures  Medications - No data to display   ____________________________________________   INITIAL IMPRESSION / ASSESSMENT AND PLAN / ED COURSE  Pertinent labs & imaging results that were available during my care of the patient were reviewed by me and considered in my medical decision making (see chart for details).  Review of the [place] CSRS was performed in accordance of the [place] prior to dispensing any controlled drugs.     Assessment and plan Shingles Patient presents to the emergency department with a dry, macular rash with vesicular formation of the skin underneath the left breast.  Differential diagnosis originally included bullous impetigo versus yeast dermatitis versus shingles.  History and physical exam findings are consistent with shingles.  Patient was discharged with Valtrex and advised to follow-up with primary care as needed.  All patient questions were answered.    ____________________________________________  FINAL CLINICAL IMPRESSION(S) / ED DIAGNOSES  Final diagnoses:  Herpes  zoster without complication      NEW MEDICATIONS STARTED DURING THIS VISIT:  ED Discharge Orders        Ordered    valACYclovir (VALTREX) 1000 MG tablet  3 times daily     07/29/17 1941    Acetaminophen-Codeine (TYLENOL/CODEINE #3) 300-30 MG tablet  Every 4 hours PRN     07/29/17 1942          This chart was dictated using voice recognition software/Dragon. Despite best efforts to proofread, errors can occur which can change the meaning. Any change was purely unintentional.    Lannie Fields, PA-C 07/29/17 1949    Nance Pear, MD 07/29/17 [phone]

## 2017-07-31 ENCOUNTER — Ambulatory Visit: Payer: Medicaid Other | Admitting: Internal Medicine

## 2017-07-31 NOTE — Progress Notes (Deleted)
[facility] Consultation     Synopsis: Patient has a history of CHF with nonischemic cardiomyopathy, OSA, DVT, she is followed here due to complaint of dyspnea secondary to asthma.  She also has a history of recent weight gain of about 100 pounds in the last 2-3 years.   Assessment and Plan:  51 yo female with systolic CHF, asthma, OSA with persistent dyspnea.   Asthma with dyspnea on exertion.  -Persistent asthma symptoms with use of rescue inhaler 3-4 days per week. Symptoms manifesting primarily as cough and dyspnea on exertion. -We will increase Qvar 40 to Qvar 80 2 puffs twice daily. Continue rescue inhaler as needed. If not improving on this regimen, we discussed potentially switching to Advair. -We will order Rast testing, CBC with differential (most recent differential did not a include eosinophil count).  OSA.  -Obstructive sleep apnea, she uses her CPAP on most nights, however, she is not using it for the entirety of the evening. -Recommend that she use her CPAP for the duration of her sleep, every night.  Morbid obesity.  -She has a significant weight gain over the last few years, which is undoubtedly contributing to her dyspnea on exertion. -We discussed trying to increase her physical activity level.  Systolic CHF.  -Known systolic congestive heart failure, follows with cardiology at [facility].  Orders Placed This Encounter  Procedures  . CBC with Differential  . Angiotensin converting enzyme RAST allergy testing with IgE.    No orders of the defined types were placed in this encounter.   No Follow-up on file.    Date: 07/31/2017  MRN# [id number] Brittany Nguyen 02-Jul-1966     Brittany Nguyen is a 51 y.o. old female seen in consultation for chief complaint of:    No chief complaint on file.   HPI:   She notes that she started having dyspnea and short of breath about 10 months ago. She was then taken to [place], she was found to  have systolic CHF with non-ischemic cardiomyopathy and has pacemake/defib.  She has OSA, has a history of DVT's, she has mild asthma. She notices that her breathing is short with mild activity such as showering, walking short distances.  Last visit her Qvar was increased to Qvar 80 2 puffs twice daily.  She has no pets, she has never been tested for allergies. She has occasional allergic symptoms but does not take anything regularly. She does not take singulair.  She has been on prednisone in the past. She has a history of OSA, she uses CPAP every night and has been on it for about 2 years.   She has gained about 100 pounds over the past 2-3 years.   Images personally reviewed; x-ray chest 04/24/17; AICD in place, there is right lower lobe/right middle lobe air bronchogram present suggestive of pneumonia. VQ scan 04/25/17; patient could not complete the procedure due to coughing and wheezing.  **CBC 11/21/15; Eos=300  **outside PFT 11/26/14;   SPIROMETRY: FVC was 2.84 liters, 91% of predicted FEV1 was 2.34, 89% of predicted FEV1 ratio was 83 FEF 25-75% liters per second was 96% of predicted  LUNG VOLUMES: TLC was 76% of predicted RV was 49% of predicted  DIFFUSION CAPACITY: DLCO was 72% of predicted DLCO/VA was 138% of predicted  FLOW VOLUME LOOP: normal  Impression Spirometry is in normal range TLC is mildly decreased DLCO is mikldly decreased but corrects for VA    Medication:    Current Outpatient  Medications:  .  Acetaminophen-Codeine (TYLENOL/CODEINE #3) 300-30 MG tablet, Take 1 tablet by mouth every 4 (four) hours as needed for up to 5 days for pain., Disp: 30 tablet, Rfl: 0 .  albuterol (PROVENTIL HFA;VENTOLIN HFA) 108 (90 Base) MCG/ACT inhaler, Inhale 2 puffs into the lungs as directed., Disp: , Rfl:  .  beclomethasone (QVAR) 80 MCG/ACT inhaler, Inhale 2 puffs into the lungs 2 (two) times daily., Disp: 1 Inhaler, Rfl: 5 .  cyanocobalamin 500 MCG tablet, Take 500 mcg by  mouth daily., Disp: , Rfl:  .  furosemide (LASIX) 40 MG tablet, Take 80 mg by mouth 2 (two) times daily. , Disp: , Rfl:  .  losartan (COZAAR) 25 MG tablet, Take 25 mg by mouth daily. , Disp: , Rfl:  .  metoprolol succinate (TOPROL-XL) 25 MG 24 hr tablet, Take 25 mg by mouth 2 (two) times daily. Total 75 mg daily, Disp: , Rfl: 11 .  metoprolol succinate (TOPROL-XL) 50 MG 24 hr tablet, Take 50 mg by mouth daily. Total 75 mg daily.Take with or immediately following a meal., Disp: , Rfl:  .  nitroGLYCERIN (NITROSTAT) 0.4 MG SL tablet, Place 1 tablet (0.4 mg total) under the tongue every 5 (five) minutes as needed for chest pain., Disp: 90 tablet, Rfl: 1 .  oxyCODONE-acetaminophen (PERCOCET/ROXICET) 5-325 MG tablet, Take 1 tablet by mouth every 6 (six) hours as needed for moderate pain., Disp: 10 tablet, Rfl: 0 .  potassium chloride SA (K-DUR,KLOR-CON) 20 MEQ tablet, Take 1 tablet by mouth daily., Disp: , Rfl: 10 .  QVAR REDIHALER 80 MCG/ACT inhaler, INHALE 2 PUFFS PO BID, Disp: , Rfl: 5 .  rOPINIRole (REQUIP) 2 MG tablet, Take 1 tablet (2 mg total) by mouth at bedtime. 2-4  Mg per night, Disp: , Rfl:  .  spironolactone (ALDACTONE) 25 MG tablet, Take 0.5 tablets (12.5 mg total) by mouth daily. (Patient taking differently: Take 25 mg by mouth daily. ), Disp: 30 tablet, Rfl: 0 .  valACYclovir (VALTREX) 1000 MG tablet, Take 1 tablet (1,000 mg total) by mouth 3 (three) times daily for 7 days., Disp: 21 tablet, Rfl: 0   Allergies:  Contrast media [iodinated diagnostic agents]; Metrizamide; Penicillins; Isosorbide nitrate; Latex; Ondansetron; Povidone-iodine; and Pulmicort [budesonide]  Review of Systems: Gen:  Denies  fever, sweats, chills HEENT: Denies blurred vision, double vision. bleeds, sore throat Cvc:  No dizziness, chest pain. Resp:   Denies cough or sputum production, shortness of breath Gi: Denies swallowing difficulty, stomach pain. Gu:  Denies bladder incontinence, burning urine Ext:   No  Joint pain, stiffness. Skin: No skin rash,  hives  Endoc:  No polyuria, polydipsia. Psych: No depression, insomnia. Other:  All other systems were reviewed with the patient and were negative other that what is mentioned in the HPI.   Physical Examination:   VS: LMP 01/22/2012 (Approximate) Comment: Hysterectomy 5 years ago  General Appearance: No distress  Neuro:without focal findings,  speech normal,  HEENT: PERRLA, EOM intact.   Pulmonary: normal breath sounds, No wheezing.  CardiovascularNormal S1,S2.  No m/r/g.   Abdomen: Benign, Soft, non-tender. Renal:  No costovertebral tenderness  GU:  No performed at this time. Endoc: No evident thyromegaly, no signs of acromegaly. Skin:   warm, no rashes, no ecchymosis  Extremities: normal, no cyanosis, clubbing.  Other findings:    LABORATORY PANEL:   CBC No results for input(s): WBC, HGB, HCT, PLT in the last 168 hours. ------------------------------------------------------------------------------------------------------------------  Chemistries  No results for input(s):  NA, K, CL, CO2, GLUCOSE, BUN, CREATININE, CALCIUM, MG, AST, ALT, ALKPHOS, BILITOT in the last 168 hours.  Invalid input(s): GFRCGP ------------------------------------------------------------------------------------------------------------------  Cardiac Enzymes No results for input(s): TROPONINI in the last 168 hours. ------------------------------------------------------------  RADIOLOGY:  No results found.     Thank  you for the consultation and for allowing [facility] to assist in the care of your patient. Our recommendations are noted above.  Please contact us if we can be of further service.   Marda Stalker, MD.  Board Certified in Internal Medicine, Pulmonary Medicine, [facility], and Sleep Medicine.  [facility] Office Number: [phone]  Patricia Pesa, M.D.  Merton Border,  M.D  07/31/2017

## 2017-08-01 ENCOUNTER — Encounter: Payer: Self-pay | Admitting: Internal Medicine

## 2017-08-04 ENCOUNTER — Ambulatory Visit: Payer: Medicaid Other | Admitting: Internal Medicine

## 2017-08-22 IMAGING — CR DG CHEST 2V
2 series · 2 of 2 positions shown · non-contrast
Comparison: 12/08/2015

CLINICAL DATA: Cough, chest pain, and shortness of breath.

EXAM:
CHEST  2 VIEW

[chest pa]
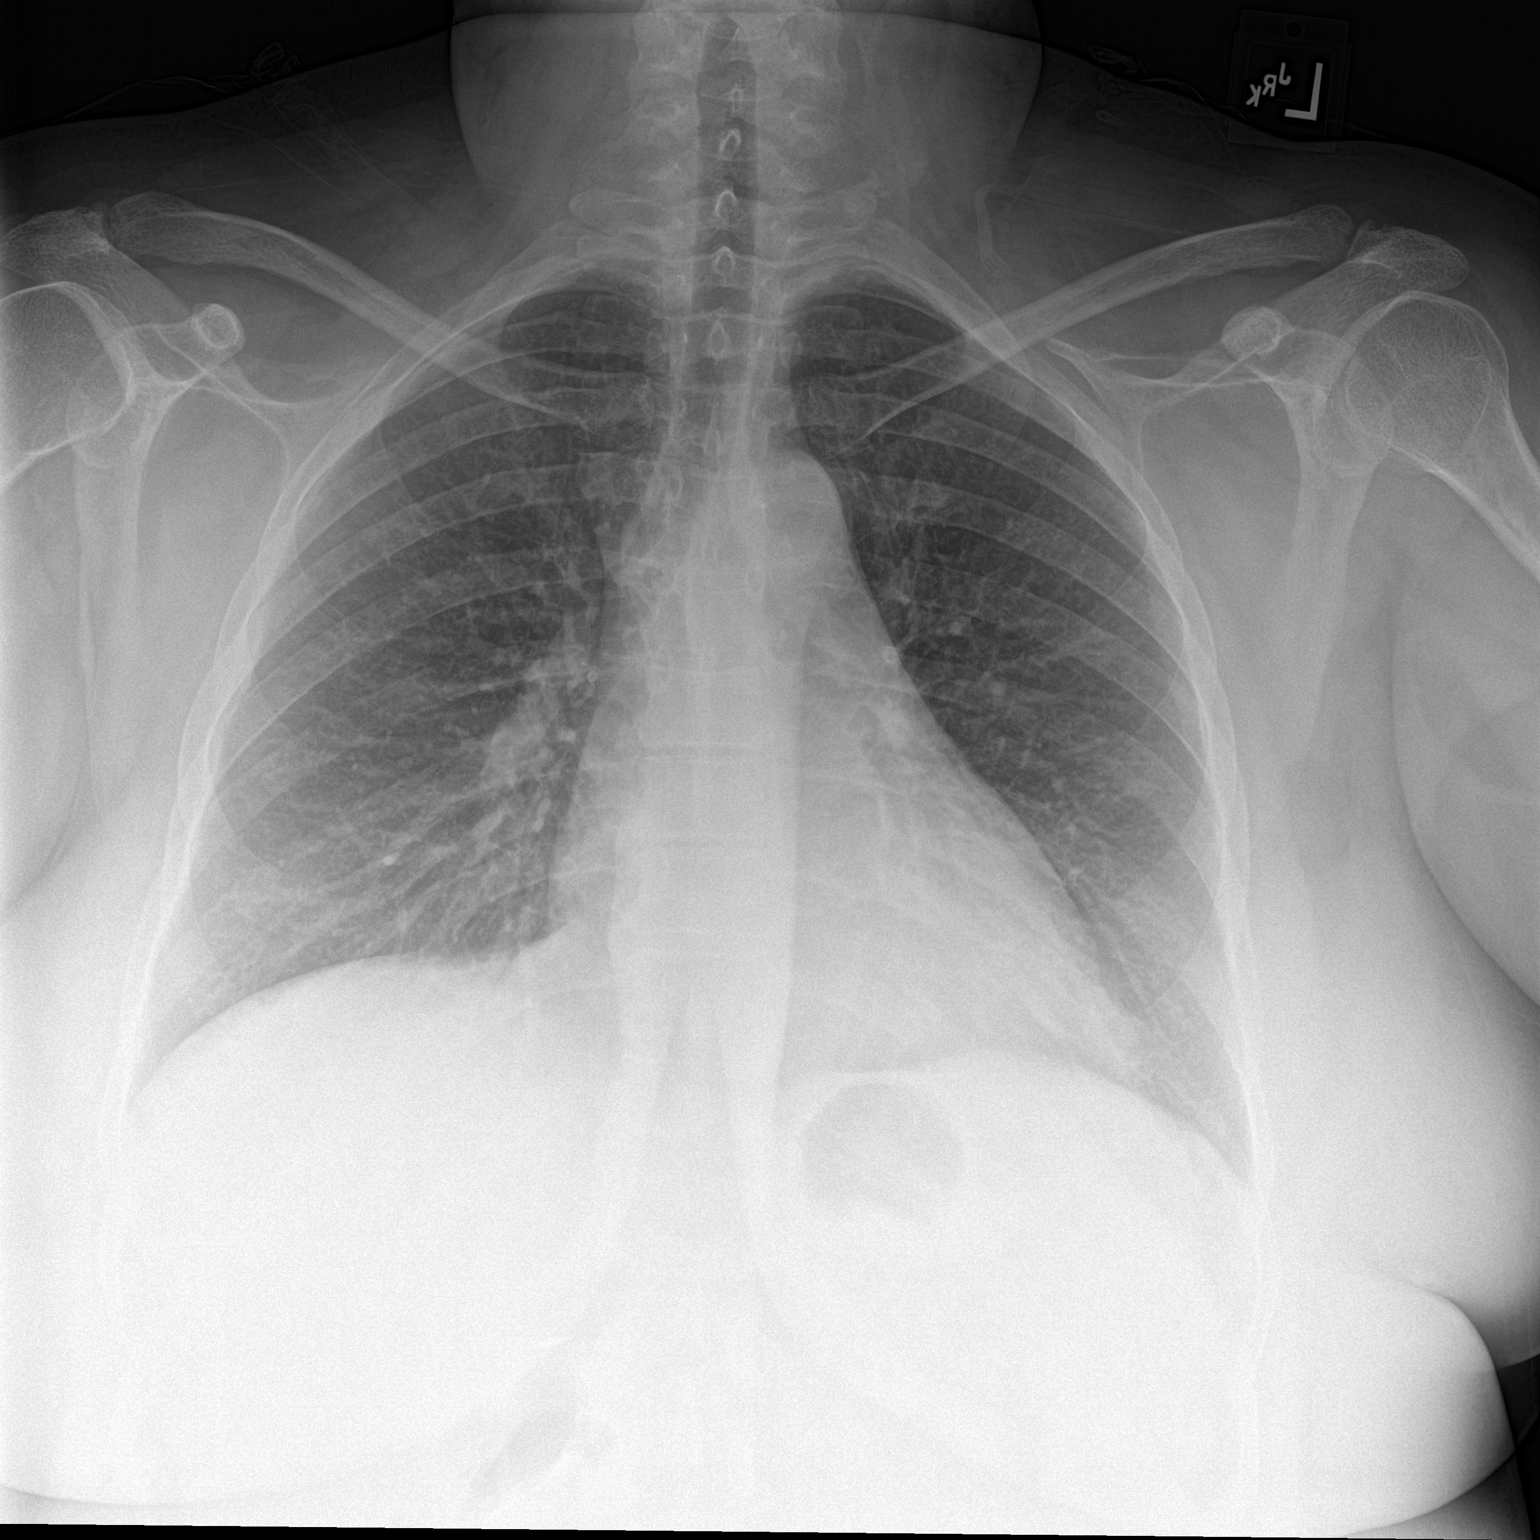

[chest lat]
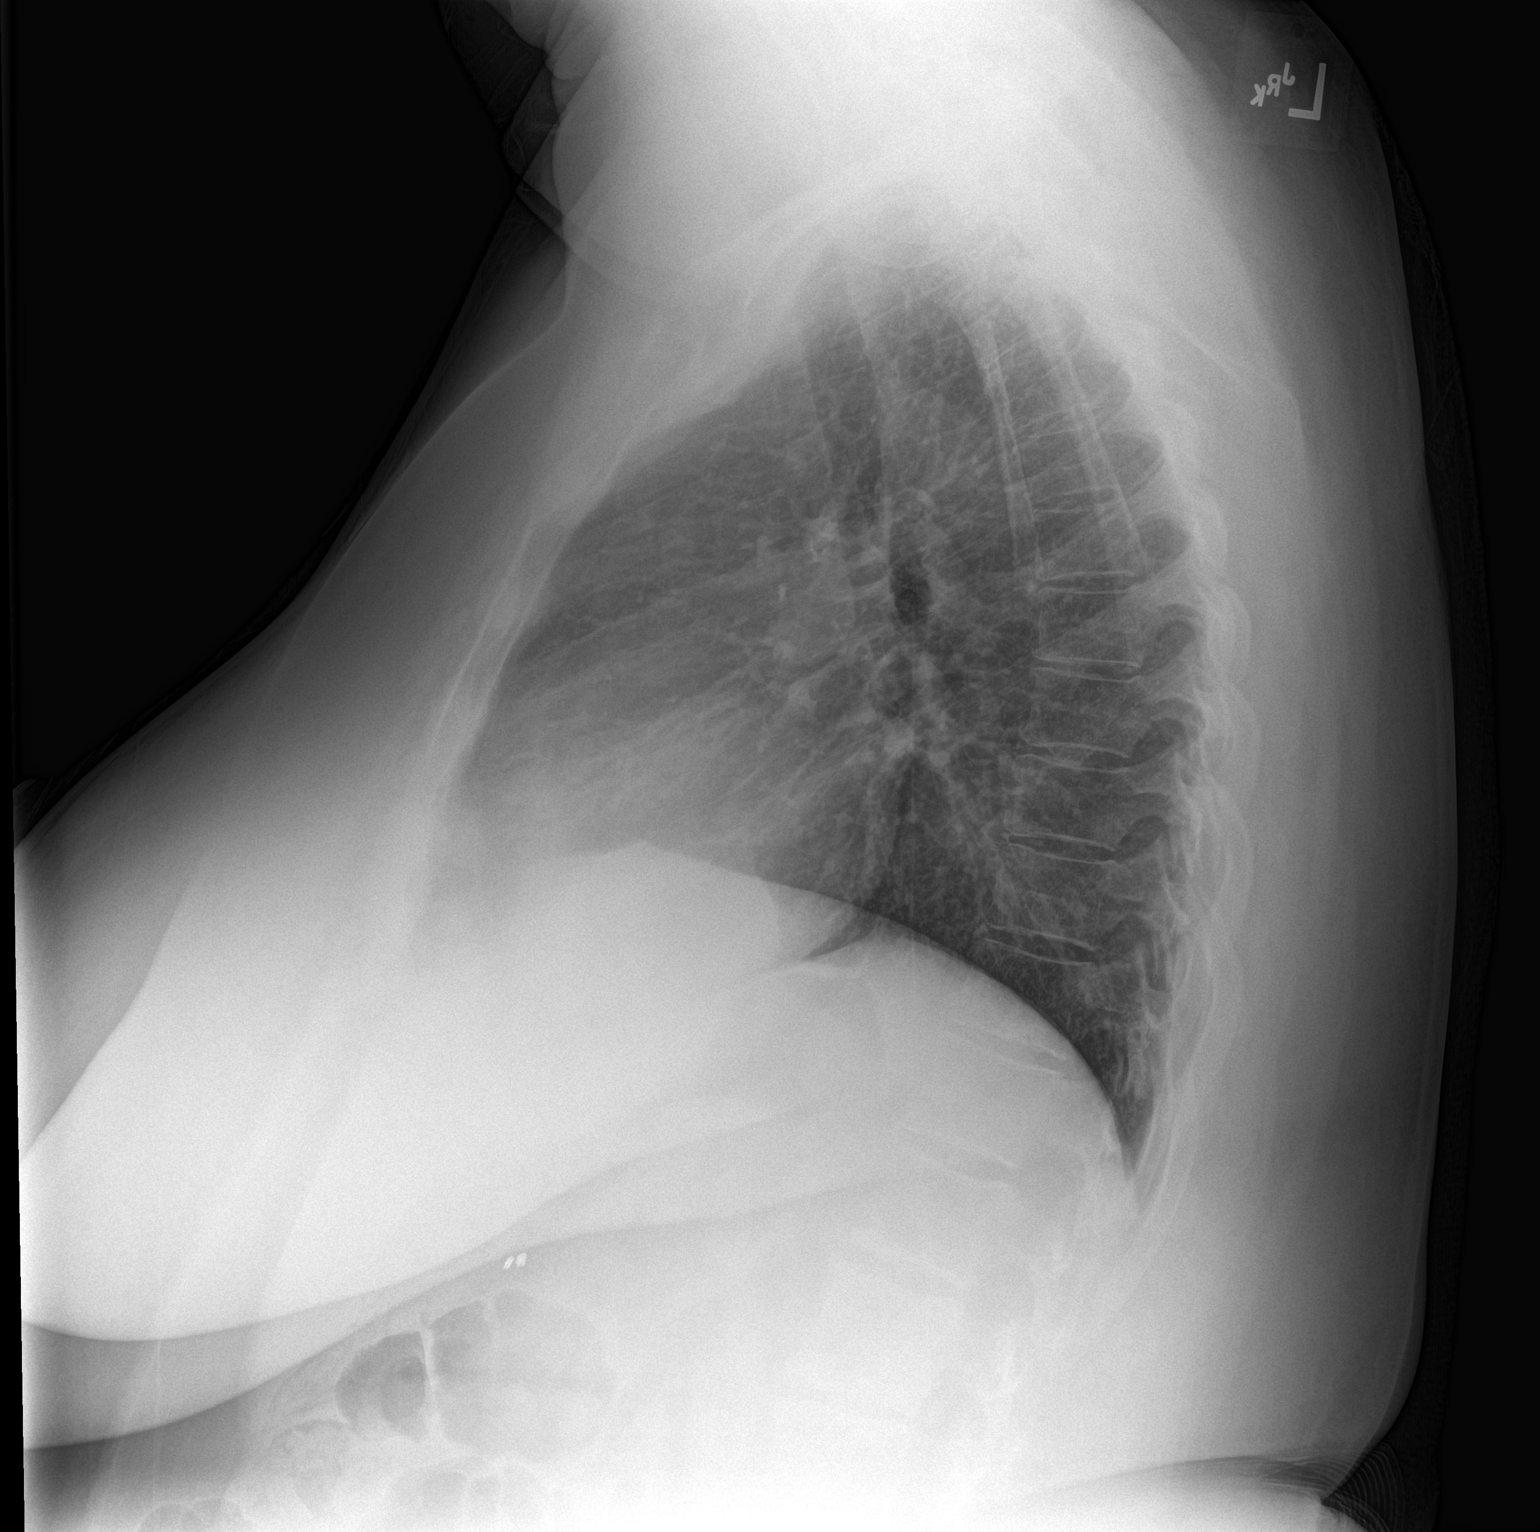

[2 of 2 positions shown; findings below may reference images not displayed]

FINDINGS: The heart size and mediastinal contours are within normal limits.
Both lungs are clear. The visualized skeletal structures are
unremarkable.
IMPRESSION: No active cardiopulmonary disease.

## 2017-08-22 IMAGING — NM NM PULMONARY VENT & PERF
2 series · 16 of 16 positions shown · non-contrast
Comparison: 11/21/2015; correlation chest radiograph 06/06/2016

CLINICAL DATA: Chest pain, history of viral infection 2-3 weeks
ago, history breast cancer, asthma, heart disease, CHF, DVT, severe
IV contrast allergy

EXAM:
NUCLEAR MEDICINE VENTILATION - PERFUSION LUNG SCAN
TECHNIQUE: Ventilation images were obtained in multiple projections using
inhaled aerosol Zc-XXm DTPA. Perfusion images were obtained in
multiple projections after intravenous injection of Zc-XXm MAA.
RADIOPHARMACEUTICALS:  33.641 mCi Uechnetium-XXm DTPA aerosol
inhalation and 4.526 mCi Uechnetium-XXm MAA IV

[Series 1000: lung ventilation · 3.90mm/px · 4 acquisitions, 8 frames shown]
[im 1/4]
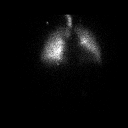
[im 1/4]
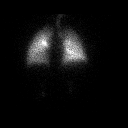
[im 2/4]
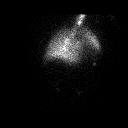
[im 2/4]
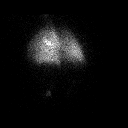
[im 3/4]
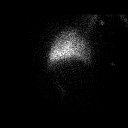
[im 3/4]
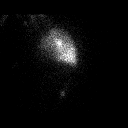
[im 4/4]
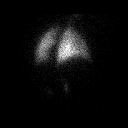
[im 4/4]
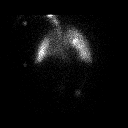

[Series 1000: lung perfusion · 1.95mm/px · 4 acquisitions, 8 frames shown]
[im 1/4]
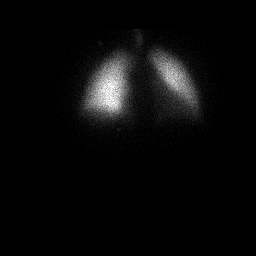
[im 1/4]
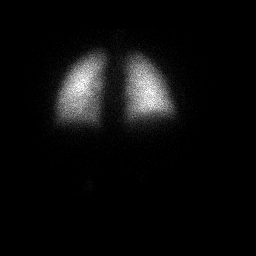
[im 2/4]
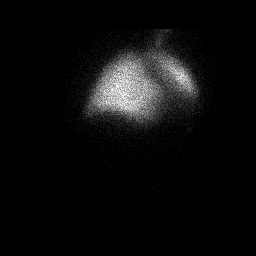
[im 2/4]
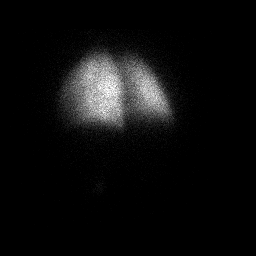
[im 3/4]
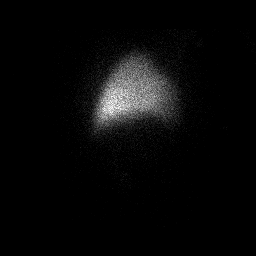
[im 3/4]
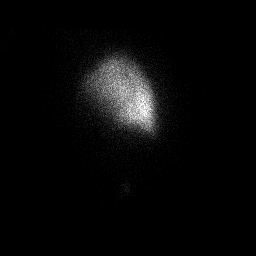
[im 4/4]
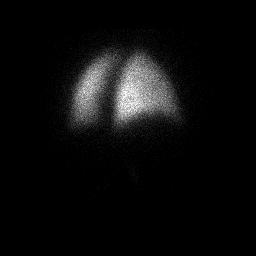
[im 4/4]
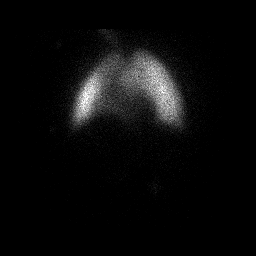

[16 of 16 positions shown; findings below may reference images not displayed]

FINDINGS: Ventilation: Small amount of central airway deposition of tracer.
Swallowed aerosol within stomach. No focal ventilatory
abnormalities.

Perfusion: Normal

Chest radiograph:  Upper normal heart size without acute infiltrate.
IMPRESSION: Normal ventilation and perfusion lung scan.

## 2017-09-24 ENCOUNTER — Encounter: Payer: Self-pay | Admitting: Emergency Medicine

## 2017-09-24 ENCOUNTER — Other Ambulatory Visit: Payer: Self-pay

## 2017-09-24 ENCOUNTER — Emergency Department: Payer: Medicaid Other

## 2017-09-24 ENCOUNTER — Inpatient Hospital Stay
Admission: EM | Admit: 2017-09-24 | Discharge: 2017-09-27 | DRG: 202 | Disposition: A | Discharge status: Home / Self Care | Payer: Medicaid Other | Attending: Internal Medicine | Admitting: Internal Medicine

## 2017-09-24 ENCOUNTER — Inpatient Hospital Stay: Payer: Medicaid Other

## 2017-09-24 DIAGNOSIS — Z7951 Long term (current) use of inhaled steroids: Secondary | ICD-10-CM

## 2017-09-24 DIAGNOSIS — B9729 Other coronavirus as the cause of diseases classified elsewhere: Secondary | ICD-10-CM | POA: Diagnosis present

## 2017-09-24 DIAGNOSIS — Z9049 Acquired absence of other specified parts of digestive tract: Secondary | ICD-10-CM

## 2017-09-24 DIAGNOSIS — J4521 Mild intermittent asthma with (acute) exacerbation: Principal | ICD-10-CM | POA: Diagnosis present

## 2017-09-24 DIAGNOSIS — Z6836 Body mass index (BMI) 36.0-36.9, adult: Secondary | ICD-10-CM | POA: Diagnosis not present

## 2017-09-24 DIAGNOSIS — Z91041 Radiographic dye allergy status: Secondary | ICD-10-CM

## 2017-09-24 DIAGNOSIS — G4733 Obstructive sleep apnea (adult) (pediatric): Secondary | ICD-10-CM | POA: Diagnosis present

## 2017-09-24 DIAGNOSIS — I959 Hypotension, unspecified: Secondary | ICD-10-CM | POA: Diagnosis present

## 2017-09-24 DIAGNOSIS — J9601 Acute respiratory failure with hypoxia: Secondary | ICD-10-CM | POA: Diagnosis present

## 2017-09-24 DIAGNOSIS — E669 Obesity, unspecified: Secondary | ICD-10-CM | POA: Diagnosis present

## 2017-09-24 DIAGNOSIS — Z86718 Personal history of other venous thrombosis and embolism: Secondary | ICD-10-CM

## 2017-09-24 DIAGNOSIS — R0603 Acute respiratory distress: Secondary | ICD-10-CM | POA: Diagnosis present

## 2017-09-24 DIAGNOSIS — Z90710 Acquired absence of both cervix and uterus: Secondary | ICD-10-CM

## 2017-09-24 DIAGNOSIS — E876 Hypokalemia: Secondary | ICD-10-CM | POA: Diagnosis present

## 2017-09-24 DIAGNOSIS — I509 Heart failure, unspecified: Secondary | ICD-10-CM

## 2017-09-24 DIAGNOSIS — I5022 Chronic systolic (congestive) heart failure: Secondary | ICD-10-CM | POA: Diagnosis present

## 2017-09-24 DIAGNOSIS — Z95810 Presence of automatic (implantable) cardiac defibrillator: Secondary | ICD-10-CM | POA: Diagnosis not present

## 2017-09-24 DIAGNOSIS — R52 Pain, unspecified: Secondary | ICD-10-CM

## 2017-09-24 DIAGNOSIS — J9600 Acute respiratory failure, unspecified whether with hypoxia or hypercapnia: Secondary | ICD-10-CM | POA: Diagnosis present

## 2017-09-24 DIAGNOSIS — I429 Cardiomyopathy, unspecified: Secondary | ICD-10-CM | POA: Diagnosis present

## 2017-09-24 DIAGNOSIS — J810 Acute pulmonary edema: Secondary | ICD-10-CM

## 2017-09-24 DIAGNOSIS — Z79899 Other long term (current) drug therapy: Secondary | ICD-10-CM | POA: Diagnosis not present

## 2017-09-24 DIAGNOSIS — Z8249 Family history of ischemic heart disease and other diseases of the circulatory system: Secondary | ICD-10-CM

## 2017-09-24 DIAGNOSIS — G2581 Restless legs syndrome: Secondary | ICD-10-CM | POA: Diagnosis present

## 2017-09-24 LAB — PROCALCITONIN: Procalcitonin: <0.1 ng/mL

## 2017-09-24 LAB — COMPREHENSIVE METABOLIC PANEL
ALT: 16 U/L (ref 14–54)
AST: 26 U/L (ref 15–41)
Albumin: 3.7 g/dL (ref 3.5–5.0)
Alkaline Phosphatase: 108 U/L (ref 38–126)
Anion gap: 14 (ref 5–15)
BUN: 15 mg/dL (ref 6–20)
CHLORIDE: 100 mmol/L — AB (ref 101–111)
CO2: 25 mmol/L (ref 22–32)
Calcium: 9.2 mg/dL (ref 8.9–10.3)
Creatinine, Ser: 1.18 mg/dL — ABNORMAL HIGH (ref 0.44–1.00)
GFR, EST NON AFRICAN AMERICAN: 53 mL/min — AB (ref >60)
Glucose, Bld: 119 mg/dL — ABNORMAL HIGH (ref 65–99)
Potassium: 2.9 mmol/L — ABNORMAL LOW (ref 3.5–5.1)
Sodium: 139 mmol/L (ref 135–145)
TOTAL PROTEIN: 7.9 g/dL (ref 6.5–8.1)
Total Bilirubin: 0.6 mg/dL (ref 0.3–1.2)

## 2017-09-24 LAB — CBC WITH DIFFERENTIAL/PLATELET
Basophils Absolute: 0 10*3/uL (ref 0–0.1)
Basophils Relative: 0 %
EOS PCT: 1 %
Eosinophils Absolute: 0.2 10*3/uL (ref 0–0.7)
HCT: 34.6 % — ABNORMAL LOW (ref 35.0–47.0)
Hemoglobin: 11.3 g/dL — ABNORMAL LOW (ref 12.0–16.0)
LYMPHS ABS: 2.5 10*3/uL (ref 1.0–3.6)
Lymphocytes Relative: 22 %
MCH: 25.4 pg — ABNORMAL LOW (ref 26.0–34.0)
MCHC: 32.5 g/dL (ref 32.0–36.0)
MCV: 78.1 fL — ABNORMAL LOW (ref 80.0–100.0)
MONO ABS: 0.6 10*3/uL (ref 0.2–0.9)
Monocytes Relative: 5 %
Neutro Abs: 8 10*3/uL — ABNORMAL HIGH (ref 1.4–6.5)
Neutrophils Relative %: 72 %
PLATELETS: 316 10*3/uL (ref 150–440)
RBC: 4.43 MIL/uL (ref 3.80–5.20)
RDW: 16.4 % — ABNORMAL HIGH (ref 11.5–14.5)
WBC: 11.3 10*3/uL — AB (ref 3.6–11.0)

## 2017-09-24 LAB — CREATININE, SERUM
Creatinine, Ser: 0.98 mg/dL (ref 0.44–1.00)
GFR calc Af Amer: >60 mL/min (ref >60)
GFR calc non Af Amer: >60 mL/min (ref >60)

## 2017-09-24 LAB — RESPIRATORY PANEL BY PCR
Adenovirus: NOT DETECTED
Bordetella pertussis: NOT DETECTED
CORONAVIRUS 229E-RVPPCR: DETECTED — AB
Chlamydophila pneumoniae: NOT DETECTED
Coronavirus HKU1: NOT DETECTED
Coronavirus NL63: NOT DETECTED
Coronavirus OC43: NOT DETECTED
INFLUENZA A-RVPPCR: NOT DETECTED
Influenza B: NOT DETECTED
METAPNEUMOVIRUS-RVPPCR: NOT DETECTED
Mycoplasma pneumoniae: NOT DETECTED
PARAINFLUENZA VIRUS 2-RVPPCR: NOT DETECTED
Parainfluenza Virus 1: NOT DETECTED
Parainfluenza Virus 3: NOT DETECTED
Parainfluenza Virus 4: NOT DETECTED
RESPIRATORY SYNCYTIAL VIRUS-RVPPCR: NOT DETECTED
RHINOVIRUS / ENTEROVIRUS - RVPPCR: NOT DETECTED

## 2017-09-24 LAB — INFLUENZA PANEL BY PCR (TYPE A & B)
INFLAPCR: NEGATIVE
Influenza B By PCR: NEGATIVE

## 2017-09-24 LAB — TROPONIN I

## 2017-09-24 LAB — BRAIN NATRIURETIC PEPTIDE: B NATRIURETIC PEPTIDE 5: 34 pg/mL (ref 0.0–100.0)

## 2017-09-24 LAB — MRSA PCR SCREENING: MRSA by PCR: NEGATIVE

## 2017-09-24 LAB — GLUCOSE, CAPILLARY: Glucose-Capillary: 128 mg/dL — ABNORMAL HIGH (ref 65–99)

## 2017-09-24 MED ORDER — SODIUM CHLORIDE 0.9% FLUSH: 3.0000 mL | INTRAVENOUS | Status: DC | PRN

## 2017-09-24 MED ORDER — ENOXAPARIN SODIUM 40 MG/0.4ML ~~LOC~~ SOLN
40.0000 mg | SUBCUTANEOUS | Status: DC
  Administered 2017-09-24 – 2017-09-26 (×3): 40 mg via SUBCUTANEOUS
  Filled 2017-09-24 (×3): qty 0.4

## 2017-09-24 MED ORDER — ROPINIROLE HCL 1 MG PO TABS
4.0000 mg | ORAL_TABLET | Freq: Every day | ORAL | Status: DC
  Administered 2017-09-24 – 2017-09-26 (×3): 4 mg via ORAL
  Filled 2017-09-24 (×3): qty 4

## 2017-09-24 MED ORDER — BECLOMETHASONE DIPROP HFA 80 MCG/ACT IN AERB: 2.0000 | INHALATION_SPRAY | Freq: Two times a day (BID) | RESPIRATORY_TRACT | Status: DC

## 2017-09-24 MED ORDER — SODIUM CHLORIDE 0.9% FLUSH: 10.0000 mL | INTRAVENOUS | Status: DC | PRN

## 2017-09-24 MED ORDER — VITAMIN B-12 1000 MCG PO TABS
500.0000 ug | ORAL_TABLET | Freq: Every day | ORAL | Status: DC
  Administered 2017-09-25 – 2017-09-27 (×3): 500 ug via ORAL
  Filled 2017-09-24 (×3): qty 1

## 2017-09-24 MED ORDER — POTASSIUM CHLORIDE CRYS ER 20 MEQ PO TBCR
40.0000 meq | EXTENDED_RELEASE_TABLET | Freq: Once | ORAL | Status: AC
  Administered 2017-09-24: 40 meq via ORAL
  Filled 2017-09-24: qty 2

## 2017-09-24 MED ORDER — FENTANYL CITRATE (PF) 100 MCG/2ML IJ SOLN
75.0000 ug | Freq: Once | INTRAMUSCULAR | Status: AC
  Administered 2017-09-24: 75 ug via INTRAVENOUS
  Filled 2017-09-24: qty 2

## 2017-09-24 MED ORDER — METOCLOPRAMIDE HCL 5 MG/ML IJ SOLN
INTRAMUSCULAR | Status: AC
  Administered 2017-09-24: 10 mg via INTRAVENOUS
  Filled 2017-09-24: qty 2

## 2017-09-24 MED ORDER — BUDESONIDE 0.25 MG/2ML IN SUSP: 0.2500 mg | Freq: Two times a day (BID) | RESPIRATORY_TRACT | Status: DC

## 2017-09-24 MED ORDER — POTASSIUM CHLORIDE CRYS ER 20 MEQ PO TBCR
20.0000 meq | EXTENDED_RELEASE_TABLET | Freq: Every day | ORAL | Status: DC
  Administered 2017-09-24 – 2017-09-27 (×4): 20 meq via ORAL
  Filled 2017-09-24 (×3): qty 1

## 2017-09-24 MED ORDER — FUROSEMIDE 40 MG PO TABS
80.0000 mg | ORAL_TABLET | Freq: Two times a day (BID) | ORAL | Status: DC
  Administered 2017-09-25: 80 mg via ORAL
  Filled 2017-09-24: qty 2

## 2017-09-24 MED ORDER — METOCLOPRAMIDE HCL 5 MG/ML IJ SOLN
10.0000 mg | Freq: Once | INTRAMUSCULAR | Status: AC
  Administered 2017-09-24: 10 mg via INTRAVENOUS

## 2017-09-24 MED ORDER — NITROGLYCERIN 0.4 MG SL SUBL
0.4000 mg | SUBLINGUAL_TABLET | SUBLINGUAL | Status: DC | PRN
  Administered 2017-09-24: 0.4 mg via SUBLINGUAL
  Filled 2017-09-24: qty 1

## 2017-09-24 MED ORDER — ACETAMINOPHEN 325 MG PO TABS
650.0000 mg | ORAL_TABLET | Freq: Four times a day (QID) | ORAL | Status: DC | PRN
  Administered 2017-09-26: 650 mg via ORAL
  Filled 2017-09-24: qty 2

## 2017-09-24 MED ORDER — ALBUTEROL SULFATE (2.5 MG/3ML) 0.083% IN NEBU: 2.5000 mg | INHALATION_SOLUTION | RESPIRATORY_TRACT | Status: DC | PRN

## 2017-09-24 MED ORDER — FUROSEMIDE 10 MG/ML IJ SOLN
80.0000 mg | Freq: Once | INTRAMUSCULAR | Status: AC
  Administered 2017-09-24: 80 mg via INTRAVENOUS
  Filled 2017-09-24: qty 8

## 2017-09-24 MED ORDER — HYDROCOD POLST-CPM POLST ER 10-8 MG/5ML PO SUER
5.0000 mL | Freq: Two times a day (BID) | ORAL | Status: DC | PRN
  Administered 2017-09-24 – 2017-09-27 (×6): 5 mL via ORAL
  Filled 2017-09-24 (×6): qty 5

## 2017-09-24 MED ORDER — IPRATROPIUM-ALBUTEROL 0.5-2.5 (3) MG/3ML IN SOLN
3.0000 mL | Freq: Four times a day (QID) | RESPIRATORY_TRACT | Status: DC
  Administered 2017-09-24 – 2017-09-26 (×11): 3 mL via RESPIRATORY_TRACT
  Filled 2017-09-24 (×10): qty 3

## 2017-09-24 MED ORDER — PREDNISONE 20 MG PO TABS
40.0000 mg | ORAL_TABLET | Freq: Every day | ORAL | Status: DC
  Administered 2017-09-25: 40 mg via ORAL
  Filled 2017-09-24: qty 2

## 2017-09-24 MED ORDER — SODIUM CHLORIDE 0.9% FLUSH
3.0000 mL | Freq: Two times a day (BID) | INTRAVENOUS | Status: DC
  Administered 2017-09-24 – 2017-09-26 (×4): 3 mL via INTRAVENOUS

## 2017-09-24 MED ORDER — OXYCODONE-ACETAMINOPHEN 5-325 MG PO TABS
1.0000 | ORAL_TABLET | Freq: Four times a day (QID) | ORAL | Status: DC | PRN
  Administered 2017-09-24 – 2017-09-27 (×9): 1 via ORAL
  Filled 2017-09-24 (×9): qty 1

## 2017-09-24 MED ORDER — FUROSEMIDE 40 MG PO TABS: 80.0000 mg | ORAL_TABLET | Freq: Two times a day (BID) | ORAL | Status: DC

## 2017-09-24 MED ORDER — BECLOMETHASONE DIPROP HFA 80 MCG/ACT IN AERB: 1.0000 | INHALATION_SPRAY | Freq: Two times a day (BID) | RESPIRATORY_TRACT | Status: DC

## 2017-09-24 MED ORDER — METHYLPREDNISOLONE SODIUM SUCC 40 MG IJ SOLR
40.0000 mg | Freq: Every day | INTRAMUSCULAR | Status: AC
  Administered 2017-09-24: 40 mg via INTRAVENOUS
  Filled 2017-09-24: qty 1

## 2017-09-24 MED ORDER — LOSARTAN POTASSIUM 25 MG PO TABS
12.5000 mg | ORAL_TABLET | Freq: Every day | ORAL | Status: DC
  Administered 2017-09-24: 12.5 mg via ORAL
  Filled 2017-09-24 (×2): qty 1

## 2017-09-24 MED ORDER — METOPROLOL SUCCINATE ER 50 MG PO TB24
50.0000 mg | ORAL_TABLET | Freq: Every day | ORAL | Status: DC
  Administered 2017-09-24 – 2017-09-27 (×4): 50 mg via ORAL
  Filled 2017-09-24 (×4): qty 1

## 2017-09-24 MED ORDER — ACETAMINOPHEN 650 MG RE SUPP: 650.0000 mg | Freq: Four times a day (QID) | RECTAL | Status: DC | PRN

## 2017-09-24 MED ORDER — BECLOMETHASONE DIPROP HFA 80 MCG/ACT IN AERB
2.0000 | INHALATION_SPRAY | Freq: Two times a day (BID) | RESPIRATORY_TRACT | Status: DC
  Administered 2017-09-24 – 2017-09-27 (×5): 2 via RESPIRATORY_TRACT
  Filled 2017-09-24 (×2): qty 10.6

## 2017-09-24 MED ORDER — NITROGLYCERIN 0.4 MG SL SUBL
0.4000 mg | SUBLINGUAL_TABLET | SUBLINGUAL | Status: DC | PRN
Start: 2017-09-24 — End: 2017-09-24

## 2017-09-24 MED ORDER — PROMETHAZINE HCL 25 MG/ML IJ SOLN
12.5000 mg | Freq: Four times a day (QID) | INTRAMUSCULAR | Status: DC | PRN
  Administered 2017-09-24 – 2017-09-27 (×10): 12.5 mg via INTRAVENOUS
  Filled 2017-09-24 (×10): qty 1

## 2017-09-24 MED ORDER — IPRATROPIUM-ALBUTEROL 0.5-2.5 (3) MG/3ML IN SOLN
RESPIRATORY_TRACT | Status: AC
  Filled 2017-09-24: qty 3

## 2017-09-24 MED ORDER — MAGNESIUM SULFATE 2 GM/50ML IV SOLN
2.0000 g | Freq: Once | INTRAVENOUS | Status: AC
  Administered 2017-09-24: 2 g via INTRAVENOUS
  Filled 2017-09-24: qty 50

## 2017-09-24 NOTE — ED Notes (Signed)
Respiratory at the bedside, pt placed on bipap. Tolerating well.

## 2017-09-24 NOTE — ED Triage Notes (Signed)
Pt c/o sudden onset substernal chest pain since approx 0300-0400 this AM, states pain radiates up to her jaw and collar bone at this time. Pt pacemaker/defib at this time. Pt also presents with cough x 3 days. Pt also c/o "bad back pain" with the chest pain.

## 2017-09-24 NOTE — Consult Note (Signed)
PULMONARY / CRITICAL CARE MEDICINE   Name: Brittany Nguyen MRN: [id number] DOB: [birth year]    ADMISSION DATE:  09/24/2017  PT PROFILE: 52 F with hx of cardiomyopathy (followed @ [facility]), unprovoked DVT several yrs ago and suspected asthma (Dr Ashby Dawes) admitted with symptoms of viral syndrome X 2-3 days and acute dyspnea, chest tightness, wheezing X several hours. Placed on BiPAP in ED and adm to SDU. Much improved by time of arrival to SDU  HISTORY OF PRESENT ILLNESS:   As above. There have been several family members and close contacts with similar viral syndrome with malaise, myalgias and subjective fever. She also describes rattling cough. She used her rescue MDI several times without relief of dyspnea prior to coming to the emergency department.  With regard to her prior DVT, there were no identifiable risk factors.  She is not aware that she underwent any kind of hypercoagulability evaluation.  He took anticoagulation medication for 3-6 months and has had no evidence of recurrence.  She denies hemoptysis, LE edema and calf tenderness.   Presently, she has had the BiPAP removed and appears comfortable on room air.  She is able to speak in full sentences.  She denies dyspnea.   PAST MEDICAL HISTORY :  She  has a past medical history of Asthma (2013), CHF (congestive heart failure) ([place]), Diverticulitis (2010), DVT (deep venous thrombosis) ([place]), Endometriosis (1990), Heart disease (2013), MRSA infection, Lump or mass in breast, and Restless leg.  PAST SURGICAL HISTORY: She  has a past surgical history that includes Abdominal hysterectomy; Nasal sinus surgery (2012); Colectomy; Cholecystectomy; Cesarean section; and Cardiac defibrillator placement.  Allergies  Allergen Reactions  . Contrast Media [Iodinated Diagnostic Agents] Shortness Of Breath  . Metrizamide Shortness Of Breath  . Penicillins Hives and Other (See Comments)    Other reaction(s): Other (See Comments) Has  patient had a PCN reaction causing immediate rash, facial/tongue/throat swelling, SOB or lightheadedness with hypotension: Yes Has patient had a PCN reaction causing severe rash involving mucus membranes or skin necrosis: No Has patient had a PCN reaction that required hospitalization No Has patient had a PCN reaction occurring within the last 10 years: No If all of the above answers are "NO", then may proceed with Cephalosporin use. Has patient had a PCN reaction causing immediate rash, facial/tongue/throat swelling, SOB or lightheadedness with hypotension: Yes Has patient had a PCN reaction causing severe rash involving mucus membranes or skin necrosis: No Has patient had a PCN reaction that required hospitalization No Has patient had a PCN reaction occurring within the last 10 years: No If all of the above answers are "NO", then may proceed with Cephalosporin use.   . Isosorbide Nitrate     Other reaction(s): Headache  . Latex Hives  . Ondansetron Other (See Comments)    Severe headache  . Povidone-Iodine Rash    Blistering rash  . Pulmicort [Budesonide] Itching    No current facility-administered medications on file prior to encounter.    Current Outpatient Medications on File Prior to Encounter  Medication Sig  . beclomethasone (QVAR) 80 MCG/ACT inhaler Inhale 2 puffs into the lungs 2 (two) times daily.  . cyanocobalamin 500 MCG tablet Take 500 mcg by mouth daily.  . furosemide (LASIX) 40 MG tablet Take 80 mg by mouth 2 (two) times daily.   Marland Kitchen losartan (COZAAR) 25 MG tablet Take 12.5 mg by mouth 2 (two) times daily.   . metoprolol succinate (TOPROL-XL) 50 MG 24 hr tablet Take 150 mg  by mouth daily. Take with or immediately following a meal.  . nitroGLYCERIN (NITROSTAT) 0.4 MG SL tablet Place 1 tablet (0.4 mg total) under the tongue every 5 (five) minutes as needed for chest pain.  Marland Kitchen rOPINIRole (REQUIP) 2 MG tablet Take 1 tablet (2 mg total) by mouth at bedtime. 2-4  Mg per night  (Patient taking differently: Take 4 mg by mouth at bedtime. )  . spironolactone (ALDACTONE) 25 MG tablet Take 0.5 tablets (12.5 mg total) by mouth daily. (Patient taking differently: Take 50 mg by mouth daily. )  . vitamin C (ASCORBIC ACID) 500 MG tablet Take 500 mg by mouth daily.  Marland Kitchen oxyCODONE-acetaminophen (PERCOCET/ROXICET) 5-325 MG tablet Take 1 tablet by mouth every 6 (six) hours as needed for moderate pain. (Patient not taking: Reported on 09/24/2017)    FAMILY HISTORY:  Her indicated that her mother is deceased. She indicated that her father is deceased. She indicated that the status of her daughter is unknown. She indicated that the status of her maternal aunt is unknown.   SOCIAL HISTORY: She  reports that  has never smoked. she has never used smokeless tobacco. She reports that she does not drink alcohol or use drugs.  REVIEW OF SYSTEMS:     SUBJECTIVE:    VITAL SIGNS: BP 128/67   Pulse (!) 118   Temp 99.3 F (37.4 C) (Axillary)   Resp 12   Ht 5\' 5"  (1.651 m)   Wt 99.5 kg (219 lb 5.7 oz)   LMP 01/22/2012 (Approximate) Comment: Hysterectomy 5 years ago  SpO2 98%   BMI 36.50 kg/m   HEMODYNAMICS:    VENTILATOR SETTINGS: FiO2 (%):  [28 %] 28 %  INTAKE / OUTPUT: No intake/output data recorded.  PHYSICAL EXAMINATION:  Gen: Obese in NAD HEENT: NCAT, sclerae white, oropharynx normal Neck: No LAN, JVP cannot be visualized Lungs: full BS, slightly coarse with few scattered wheezes, no other adventitious sounds Cardiovascular: RRR, no M noted Abdomen: Soft, NT, +BS Ext: Warm, no C/C/E Neuro: PERRL, EOMI, motor/sensory grossly intact Skin: No lesions noted   LABS:  BMET Recent Labs  Lab 09/24/17 0728  NA 139  K 2.9*  CL 100*  CO2 25  BUN 15  CREATININE 0.98  1.18*  GLUCOSE 119*    Electrolytes Recent Labs  Lab 09/24/17 0728  CALCIUM 9.2    CBC Recent Labs  Lab 09/24/17 0728  WBC 11.3*  HGB 11.3*  HCT 34.6*  PLT 316    Coag's No  results for input(s): APTT, INR in the last 168 hours.  Sepsis Markers No results for input(s): LATICACIDVEN, PROCALCITON, O2SATVEN in the last 168 hours.  ABG No results for input(s): PHART, PCO2ART, PO2ART in the last 168 hours.  Liver Enzymes Recent Labs  Lab 09/24/17 0728  AST 26  ALT 16  ALKPHOS 108  BILITOT 0.6  ALBUMIN 3.7    Cardiac Enzymes Recent Labs  Lab 09/24/17 0728  TROPONINI <0.03    Glucose Recent Labs  Lab 09/24/17 1025  GLUCAP 128*    Imaging Dg Chest Port 1 View  Result Date: 09/24/2017 CLINICAL DATA:  52 year old female with history of cough and congestion for the past 3-4 days. Increasing shortness of breath with central chest pain. EXAM: PORTABLE CHEST 1 VIEW COMPARISON:  Chest x-ray 07/03/2017. FINDINGS: Lung volumes are normal. No consolidative airspace disease. No pleural effusions. No pneumothorax. No pulmonary nodule or mass noted. Pulmonary vasculature and the cardiomediastinal silhouette are within normal limits. Left-sided pacemaker/AICD in  place with lead tips projecting over the expected location of the right atrium and right ventricular apex. IMPRESSION: 1.  No radiographic evidence of acute cardiopulmonary disease. Electronically Signed   By: Vinnie Langton M.D.   On: 09/24/2017 08:00      ASSESSMENT / PLAN: Acute respiratory distress with clear chest x-ray  Much improved after bronchodilator therapy and brief noninvasive ventilation History of asthma History of cardiomyopathy   CXR does not suggest pulmonary edema History of unprovoked DVT  Dramatic improvement with bronchodilators suggests PE is unlikely  We will watch in SDU for now and possibly transfer to [facility] later in the day Nebulized bronchodilators on schedule and as needed Continue inhaled steroids Continue systemic steroids -she can likely change to prednisone 01/21 and complete a 5-day course Lower extremity venous ultrasound has been ordered I have ordered a  hypercoagulability panel  Merton Border, MD PCCM service Mobile [phone] Pager [phone] 09/24/2017 11:24 AM

## 2017-09-24 NOTE — Progress Notes (Signed)
Bipap standing by a tbedside ,m plugged into redout let

## 2017-09-24 NOTE — ED Notes (Signed)
Pt had sudden nausea with vomiting after giving fentanyl and K-dur. EDP notified and reglan given as ordered.

## 2017-09-24 NOTE — ED Notes (Signed)
Pt states she has had a cough with congestion for the past 3-4 days, states last night she began having increased SOB with central chest pain. Pt has a pacemaker/defib. Pt is tachypneic on arrival, grimacing with pain that she states radiates into her back.

## 2017-09-24 NOTE — ED Provider Notes (Signed)
[facility] Emergency Department Provider Note  ____________________________________________   First MD Initiated Contact with Patient 09/24/17 [phone]     (approximate)  I have reviewed the triage vital signs and the nursing notes.   HISTORY  Chief Complaint Chest Pain   HPI Brittany Nguyen is a 52 y.o. female who self presents to the emergency department with acute sudden onset substernal chest pain moderate to severe radiating to her left and right jaw associated with shortness of breath that began roughly 3-4 hours prior to arrival.  Patient has a complex past medical history including nonischemic cardiomyopathy as well as significant congestive heart failure.  She says she has felt "not right" for the past 2-3 days.  She does feel like her legs and hands have become more swollen recently.  She does not think she is gained weight.  She has had no change in her medications recently.  She does report compliance with her medications.  Previous cardiology note and cardiac catheterization are below.  Her symptoms are worsened when lying down and improved when sitting up.  She sleeps on 2 pillows along with a bed that is elevated and cannot lie flat whatsoever.  08/30/17 Cards note: Reason for Visit: Brittany Nguyen is a 52 y.o. female being seen for routine visit and continued care of nonischemic cardiomyopathy.  Assessment & Plan: 1. Systolic heart failure secondary to non-ischemic cardiomyopathy Recent LHC with no significant CAD.  - she appears euvolemic on exam today with NYHA class III symptoms. There is likely a component of deconditioning and asthma that is confounding her symptoms. Her ProBNP is the lowest it has been in quite some time. Will continue Lasix 80 mg BID.  - Continue losartan 12.5 mg bid, spironolactone 25 mg daily  - Will continue to uptitrate her metoprolol - she will start by changing it to 100mg  at night and then will increase to 125mg  at  night after about a week, then to 150mg  at night. - s/p ICD placement - followed by EP - interrogated today as she was complaining of elevated HR. Appears to all be sinus tachycardiac. As above will continue to increase her Metoprolol. May need to consider Ivabradine to help decrease HR in the future.   2. OSA on CPAP She has been compliant with her CPAP since her last visit. I did suggest that she repeat her sleep study as it has been at least 3 years and it was completed before her diagnosis of HF. I wonder if that will help her PND.  3. History of DVTs (deep vein thrombosis):  A review of CareEverywhere shows no recent LE dopplers with DVT. - not currently on anticoagulation.   4. Mild intermittent asthma without complication Is followed by Pulmonary in [place]. She will continue to follow up with them as scheduled. - Has had PFT's (in [place]) that showed normal spirometry, TLC was mildly decreased and DLCO was mildly decreased the corrected for VA.    02/23/17 Cath:  FINAL CARDIAC CATHETERIZATION REPORT  Date of Procedure: 02/23/2017 ___________________________________________________________________________ _  Impression: 1 - Normal Coronary Arteriograms 2 - Mild pulmonary hypertension associated with mildly elevated PCW       Past Medical History:  Diagnosis Date  . Asthma 2013  . CHF (congestive heart failure) ([place])   . Diverticulitis 2010  . DVT (deep venous thrombosis) ([place])   . Endometriosis 1990  . Heart disease 2013  . Hx MRSA infection   . Lump or  mass in breast   . Restless leg     Patient Active Problem List   Diagnosis Date Noted  . Sepsis ([place]) 07/03/2017  . COPD (chronic obstructive pulmonary disease) ([place]) 04/26/2017  . Community acquired pneumonia 04/25/2017  . Chronic systolic heart failure ([place]) 02/03/2017  . Hypotension 02/03/2017  . Chest pain 12/09/2016  . RLS (restless legs syndrome) 12/09/2016  . Cough, persistent 11/22/2015    . Hypokalemia 11/22/2015  . Leukocytosis 11/22/2015  . Dyspnea 11/21/2015  . Respiratory distress 09/19/2015  . Breast pain 01/11/2013  . Hx MRSA infection   . Lump or mass in breast   . GOITER, MULTINODULAR 01/20/2009  . HYPOGLYCEMIA, UNSPECIFIED 01/20/2009  . GERD 01/20/2009  . DIVERTICULITIS OF COLON 01/20/2009    Past Surgical History:  Procedure Laterality Date  . ABDOMINAL HYSTERECTOMY     age 59  . CARDIAC DEFIBRILLATOR PLACEMENT    . CESAREAN SECTION    . CHOLECYSTECTOMY    . COLECTOMY    . NASAL SINUS SURGERY  2012    Prior to Admission medications   Medication Sig Start Date End Date Taking? Authorizing Provider  beclomethasone (QVAR) 80 MCG/ACT inhaler Inhale 2 puffs into the lungs 2 (two) times daily. 06/05/17 06/05/18  Brittany Hobby, MD  cyanocobalamin 500 MCG tablet Take 500 mcg by mouth daily.    [provider]  furosemide (LASIX) 40 MG tablet Take 80 mg by mouth 2 (two) times daily.     [provider]  losartan (COZAAR) 25 MG tablet Take 25 mg by mouth daily.     [provider]  metoprolol succinate (TOPROL-XL) 25 MG 24 hr tablet Take 25 mg by mouth 2 (two) times daily. Total 75 mg daily 06/07/17   [provider]  metoprolol succinate (TOPROL-XL) 50 MG 24 hr tablet Take 50 mg by mouth daily. Total 75 mg daily.Take with or immediately following a meal.    [provider]  nitroGLYCERIN (NITROSTAT) 0.4 MG SL tablet Place 1 tablet (0.4 mg total) under the tongue every 5 (five) minutes as needed for chest pain. 01/24/17   Brittany Leber, MD  oxyCODONE-acetaminophen (PERCOCET/ROXICET) 5-325 MG tablet Take 1 tablet by mouth every 6 (six) hours as needed for moderate pain. 07/05/17   Brittany Costa, MD  potassium chloride SA (K-DUR,KLOR-CON) 20 MEQ tablet Take 1 tablet by mouth daily. 06/05/17   [provider]  QVAR REDIHALER 80 MCG/ACT inhaler INHALE 2 PUFFS PO BID 06/12/17   [provider]   rOPINIRole (REQUIP) 2 MG tablet Take 1 tablet (2 mg total) by mouth at bedtime. 2-4  Mg per night 04/26/17   Brittany Costa, MD  spironolactone (ALDACTONE) 25 MG tablet Take 0.5 tablets (12.5 mg total) by mouth daily. Patient taking differently: Take 25 mg by mouth daily.  01/31/17   [name], MD    Allergies Contrast media [iodinated diagnostic agents]; Metrizamide; Penicillins; Isosorbide nitrate; Latex; Ondansetron; Povidone-iodine; and Pulmicort [budesonide]  Family History  Problem Relation Age of Onset  . Cancer Mother 30       ovarian  . Cancer Father 70       brain  . Cancer Daughter 18       skin  . Cancer Maternal Aunt 81       breast    Social History Social History   Tobacco Use  . Smoking status: Never Smoker  . Smokeless tobacco: Never Used  Substance Use Topics  . Alcohol use: No  .  Drug use: No    Review of Systems Constitutional: No fever/chills Eyes: No visual changes. ENT: No sore throat. Cardiovascular: Positive for chest pain. Respiratory: Positive for shortness of breath. Gastrointestinal: No abdominal pain.  No nausea, no vomiting.  No diarrhea.  No constipation. Genitourinary: Negative for dysuria. Musculoskeletal: Negative for back pain. Skin: Negative for rash. Neurological: Negative for headaches, focal weakness or numbness.   ____________________________________________   PHYSICAL EXAM:  VITAL SIGNS: ED Triage Vitals  Enc Vitals Group     BP --      Pulse --      Resp --      Temp --      Temp src --      SpO2 --      Weight 09/24/17 0719 220 lb (99.8 kg)     Height 09/24/17 0719 5\' 5"  (1.651 m)     Head Circumference --      Peak Flow --      Pain Score 09/24/17 0718 9     Pain Loc --      Pain Edu? --      Excl. in [place]? --     Constitutional: In moderate respiratory distress sitting up speaking in short sentences appears extremely uncomfortable mildly diaphoretic Eyes: PERRL EOMI. Head: Atraumatic. Nose: No  congestion/rhinnorhea. Mouth/Throat: No trismus Neck: Unable to lie flat.  Significant JVD Cardiovascular: Tachycardic rate, regular rhythm. Grossly normal heart sounds.  Good peripheral circulation. Respiratory: Moderate respiratory distress using accessory muscles breathing through pursed lips decreased breath sounds at bilateral bases along with crackles Gastrointestinal: Obese soft nontender Musculoskeletal: 1+ pitting edema to midshin bilaterally.  Legs are equal in size Neurologic:   No gross focal neurologic deficits are appreciated. Skin: Mild diaphoresis Psychiatric: Appears anxious and uncomfortable    ____________________________________________   DIFFERENTIAL includes but not limited to  Pulmonary edema, CHF exacerbation, acute coronary syndrome, pulmonary embolism, pneumonia, pneumothorax ____________________________________________   LABS (all labs ordered are listed, but only abnormal results are displayed)  Labs Reviewed  COMPREHENSIVE METABOLIC PANEL - Abnormal; Notable for the following components:      Result Value   Potassium 2.9 (*)    Chloride 100 (*)    Glucose, Bld 119 (*)    Creatinine, Ser 1.18 (*)    GFR calc non Af Amer 53 (*)    All other components within normal limits  CBC WITH DIFFERENTIAL/PLATELET - Abnormal; Notable for the following components:   WBC 11.3 (*)    Hemoglobin 11.3 (*)    HCT 34.6 (*)    MCV 78.1 (*)    MCH 25.4 (*)    RDW 16.4 (*)    Neutro Abs 8.0 (*)    All other components within normal limits  TROPONIN I  BRAIN NATRIURETIC PEPTIDE    Lab work reviewed by me with slightly elevated white count which is nonspecific.  Slightly low potassium likely secondary to stress.  No signs of acute ischemia __________________________________________  EKG  ED ECG REPORT I, Darel Hong, the attending physician, personally viewed and interpreted this ECG.  Date: 09/24/2017 EKG Time:  Narrative Interpretation: Significant  amount of artifact makes interpretation very difficult.  It appears to be sinus tachycardia to 127 with a leftward axis and poor R wave progression but no obvious signs of acute ischemia  ____________________________________________  RADIOLOGY  Chest x-ray reviewed by me with no acute disease ____________________________________________   PROCEDURES  Procedure(s) performed: no  .Critical Care Performed by: Darel Hong, MD  Authorized by: Darel Hong, MD   Critical care provider statement:    Critical care time (minutes):  35   Critical care time was exclusive of:  Separately billable procedures and treating other patients   Critical care was necessary to treat or prevent imminent or life-threatening deterioration of the following conditions:  Cardiac failure and respiratory failure   Critical care was time spent personally by me on the following activities:  Development of treatment plan with patient or surrogate, discussions with consultants, evaluation of patient's response to treatment, examination of patient, obtaining history from patient or surrogate, ordering and performing treatments and interventions, ordering and review of laboratory studies, ordering and review of radiographic studies, pulse oximetry, re-evaluation of patient's condition and review of old charts    Critical Care performed: Yes  Observation: no ____________________________________________   INITIAL IMPRESSION / ASSESSMENT AND PLAN / ED COURSE  Pertinent labs & imaging results that were available during my care of the patient were reviewed by me and considered in my medical decision making (see chart for details).  The patient arrives tachycardic, tachypneic, in moderate respiratory distress.  She is diaphoretic with crackles at bilateral bases and cannot lie flat.  She had a cardiac catheterization that was normal 7 months ago and I doubt acute coronary syndrome at this time although the patient  is clinically quite fluid overloaded.  She takes 80 mg of Lasix twice a day so we will give her 80 mg IV now as well as sublingual nitroglycerin and initiate her on BiPAP.    ----------------------------------------- 8:10 AM on 09/24/2017 -----------------------------------------  The patient is significantly more comfortable on BiPAP.  Fortunately her first troponin is negative.  At this point the patient requires inpatient admission for continued IV diuresis as well as respiratory support for her acute pulmonary edema.  I discussed the patient and her family who verbalized understanding and agreement the plan.  I then discussed with the hospitalist Dr. Benjie Karvonen who is graciously agreed to admit the patient to her service. ____________________________________________   FINAL CLINICAL IMPRESSION(S) / ED DIAGNOSES  Final diagnoses:  Acute respiratory distress  Acute on chronic congestive heart failure, unspecified heart failure type (HCC)  Acute pulmonary edema (HCC)      NEW MEDICATIONS STARTED DURING THIS VISIT:  New Prescriptions   No medications on file     Note:  This document was prepared using Dragon voice recognition software and may include unintentional dictation errors.     Darel Hong, MD 09/24/17 [phone]

## 2017-09-24 NOTE — H&P (Signed)
[facility] [place] NAME: Brittany Nguyen    MR#:  [id number]  DATE OF BIRTH:  [birth year]  DATE OF ADMISSION:  09/24/2017  PRIMARY CARE PHYSICIAN: [name], MD   REQUESTING/REFERRING PHYSICIAN: Dr Darel Hong  CHIEF COMPLAINT:   Chief Complaint  Patient presents with  . Chest Pain    HISTORY OF PRESENT ILLNESS:  Brittany Nguyen  is a 52 y.o. female with a known history of CHF and a defibrillator.  She presents with shortness of breath and chest pain.  She states it started off as a cold 2-3 days ago.  Her grandchild was also sick and it went through the family.  Her cough is nonproductive.  This morning she woke up with worsening shortness of breath.  She states the chest pain is worse with coughing and goes to her back.  She did have a fever and feels hot.  In the ER, she was not moving much air and was put on BiPAP.  ER physician gave a dose of IV Lasix.  Patient also had some nausea and vomiting.  Hospitalist services were contacted for further evaluation.  PAST MEDICAL HISTORY:   Past Medical History:  Diagnosis Date  . Asthma 2013  . CHF (congestive heart failure) ([place])   . Diverticulitis 2010  . DVT (deep venous thrombosis) ([place])   . Endometriosis 1990  . Heart disease 2013  . Hx MRSA infection   . Lump or mass in breast   . Restless leg     PAST SURGICAL HISTORY:   Past Surgical History:  Procedure Laterality Date  . ABDOMINAL HYSTERECTOMY     age 52  . CARDIAC DEFIBRILLATOR PLACEMENT    . CESAREAN SECTION    . CHOLECYSTECTOMY    . COLECTOMY    . NASAL SINUS SURGERY  2012    SOCIAL HISTORY:   Social History   Tobacco Use  . Smoking status: Never Smoker  . Smokeless tobacco: Never Used  Substance Use Topics  . Alcohol use: No    FAMILY HISTORY:   Family History  Problem Relation Age of Onset  . Cancer Mother 30       ovarian  . CAD Mother   . Cancer Father 59       brain  . CAD Father    . Cancer Daughter 18       skin  . Cancer Maternal Aunt 34       breast    DRUG ALLERGIES:   Allergies  Allergen Reactions  . Contrast Media [Iodinated Diagnostic Agents] Shortness Of Breath  . Metrizamide Shortness Of Breath  . Penicillins Hives and Other (See Comments)    Other reaction(s): Other (See Comments) Has patient had a PCN reaction causing immediate rash, facial/tongue/throat swelling, SOB or lightheadedness with hypotension: Yes Has patient had a PCN reaction causing severe rash involving mucus membranes or skin necrosis: No Has patient had a PCN reaction that required hospitalization No Has patient had a PCN reaction occurring within the last 10 years: No If all of the above answers are "NO", then may proceed with Cephalosporin use. Has patient had a PCN reaction causing immediate rash, facial/tongue/throat swelling, SOB or lightheadedness with hypotension: Yes Has patient had a PCN reaction causing severe rash involving mucus membranes or skin necrosis: No Has patient had a PCN reaction that required hospitalization No Has patient had a PCN reaction occurring within the last 10 years: No If all of  the above answers are "NO", then may proceed with Cephalosporin use.   . Isosorbide Nitrate     Other reaction(s): Headache  . Latex Hives  . Ondansetron Other (See Comments)    Severe headache  . Povidone-Iodine Rash    Blistering rash  . Pulmicort [Budesonide] Itching    REVIEW OF SYSTEMS:  CONSTITUTIONAL: Positive for fever and hot feeling.  Some fatigue.  EYES: No blurred or double vision.  EARS, NOSE, AND THROAT: No tinnitus or ear pain. No sore throat RESPIRATORY:  positive for dry cough, positive for shortness of breath, some wheezing.  No hemoptysis.  CARDIOVASCULAR: Positive for chest pain.  Some orthopnea, and edema.  GASTROINTESTINAL: Positive for nausea, and vomiting.  No diarrhea or abdominal pain. No blood in bowel movements GENITOURINARY: No dysuria,  hematuria.  ENDOCRINE: No polyuria, nocturia,  HEMATOLOGY: No anemia, easy bruising or bleeding SKIN: No rash or lesion. MUSCULOSKELETAL: Positive for leg pain and knee pain NEUROLOGIC: No tingling, numbness, weakness.  PSYCHIATRY: No anxiety or depression.   MEDICATIONS AT HOME:   Prior to Admission medications   Medication Sig Start Date End Date Taking? Authorizing Provider  beclomethasone (QVAR) 80 MCG/ACT inhaler Inhale 2 puffs into the lungs 2 (two) times daily. 06/05/17 06/05/18  Laverle Hobby, MD  cyanocobalamin 500 MCG tablet Take 500 mcg by mouth daily.    [provider]  furosemide (LASIX) 40 MG tablet Take 80 mg by mouth 2 (two) times daily.     [provider]  losartan (COZAAR) 25 MG tablet Take 25 mg by mouth daily.     [provider]  metoprolol succinate (TOPROL-XL) 25 MG 24 hr tablet Take 25 mg by mouth 2 (two) times daily. Total 75 mg daily 06/07/17   [provider]  metoprolol succinate (TOPROL-XL) 50 MG 24 hr tablet Take 50 mg by mouth daily. Total 75 mg daily.Take with or immediately following a meal.    [provider]  nitroGLYCERIN (NITROSTAT) 0.4 MG SL tablet Place 1 tablet (0.4 mg total) under the tongue every 5 (five) minutes as needed for chest pain. 01/24/17   Henreitta Leber, MD  oxyCODONE-acetaminophen (PERCOCET/ROXICET) 5-325 MG tablet Take 1 tablet by mouth every 6 (six) hours as needed for moderate pain. 07/05/17   Bettey Costa, MD  potassium chloride SA (K-DUR,KLOR-CON) 20 MEQ tablet Take 1 tablet by mouth daily. 06/05/17   [provider]  rOPINIRole (REQUIP) 2 MG tablet Take 1 tablet (2 mg total) by mouth at bedtime. 2-4  Mg per night 04/26/17   Bettey Costa, MD  spironolactone (ALDACTONE) 25 MG tablet Take 0.5 tablets (12.5 mg total) by mouth daily. Patient taking differently: Take 25 mg by mouth daily.  01/31/17   [name], MD      VITAL SIGNS:  Blood pressure 96/86, pulse (!) 103,  temperature 98.1 F (36.7 C), temperature source Oral, resp. rate (!) 30, height 5\' 5"  (1.651 m), weight 99.8 kg (220 lb), last menstrual period 01/22/2012, SpO2 99 %.  PHYSICAL EXAMINATION:  GENERAL:  52 y.o.-year-old patient lying in the bed with tachypnea.  EYES: Pupils equal, round, reactive to light and accommodation. No scleral icterus. Extraocular muscles intact.  HEENT: Head atraumatic, normocephalic. Oropharynx and nasopharynx clear.  NECK:  Supple, no jugular venous distention. No thyroid enlargement, no tenderness.  LUNGS: Decreased breath sounds bilaterally, coarse breath sounds throughout.  Slight expiratory wheezing.  No rales,rhonchi or crepitation. No use of accessory muscles of respiration.  CARDIOVASCULAR: S1, S2  tachycardic.  No murmurs, rubs, or gallops.  ABDOMEN: Soft, nontender, nondistended. Bowel sounds present. No organomegaly or mass.  EXTREMITIES: Trace edema, no cyanosis, or clubbing.  NEUROLOGIC: Cranial nerves II through XII are intact. Muscle strength 5/5 in all extremities. Sensation intact. Gait not checked.  PSYCHIATRIC: The patient is alert and oriented x 3.  SKIN: No rash, lesion, or ulcer.   LABORATORY PANEL:   CBC Recent Labs  Lab 09/24/17 0728  WBC 11.3*  HGB 11.3*  HCT 34.6*  PLT 316   ------------------------------------------------------------------------------------------------------------------  Chemistries  Recent Labs  Lab 09/24/17 0728  NA 139  K 2.9*  CL 100*  CO2 25  GLUCOSE 119*  BUN 15  CREATININE 1.18*  CALCIUM 9.2  AST 26  ALT 16  ALKPHOS 108  BILITOT 0.6   ------------------------------------------------------------------------------------------------------------------  Cardiac Enzymes Recent Labs  Lab 09/24/17 0728  TROPONINI <0.03   ------------------------------------------------------------------------------------------------------------------  RADIOLOGY:  Dg Chest Port 1 View  Result Date:  09/24/2017 CLINICAL DATA:  52 year old female with history of cough and congestion for the past 3-4 days. Increasing shortness of breath with central chest pain. EXAM: PORTABLE CHEST 1 VIEW COMPARISON:  Chest x-ray 07/03/2017. FINDINGS: Lung volumes are normal. No consolidative airspace disease. No pleural effusions. No pneumothorax. No pulmonary nodule or mass noted. Pulmonary vasculature and the cardiomediastinal silhouette are within normal limits. Left-sided pacemaker/AICD in place with lead tips projecting over the expected location of the right atrium and right ventricular apex. IMPRESSION: 1.  No radiographic evidence of acute cardiopulmonary disease. Electronically Signed   By: Vinnie Langton M.D.   On: 09/24/2017 08:00    EKG:   EKG read as accelerated junctional rhythm with low voltage 127 bpm  IMPRESSION AND PLAN:   1.  Acute respiratory failure with tachypnea.  More likely asthma exacerbation triggered by upper respiratory tract infection.  Patient placed on BiPAP.  Case discussed with critical care specialist.  I will start Solu-Medrol, DuoNeb nebulizer solution and Qvar.  Need to get better air entry.  The patient has a contrast allergy so we are unable to get a CT scan of the chest.  We will get an ultrasound of the lower extremities.  I will give IV magnesium.  Send off influenza and respiratory panel.  Add on pro-calcitonin. 2.  Relative hypotension.  Need to do lower dose of her medications.  Continue Lasix, hold Spironolactone, lower dose metoprolol and losartan. 3.  Hypokalemia replace potassium orally.  Also give IV magnesium. 4.  History of systolic congestive heart failure.  Last ejection fraction 30-35%.  I am not sure if this is an acute exacerbation or not.  Continue to follow closely.  ER physician did give a dose of IV Lasix but I do not think I need to continue the IV Lasix at this point.  Go back on her usual Lasix dose. 5.  Restless leg syndrome on Requip  All the  records are reviewed and case discussed with ED provider. Management plans discussed with the patient, family and they are in agreement.  CODE STATUS: Full code  TOTAL TIME TAKING CARE OF THIS PATIENT: 50 minutes.    Loletha Grayer M.D on 09/24/2017 at 8:52 AM  Between 7am to 6pm - Pager - [phone]  After 6pm call admission pager [phone]  [facility]  [phone]  CC: Primary care physician; [name], MD

## 2017-09-25 LAB — BASIC METABOLIC PANEL
Anion gap: 10 (ref 5–15)
BUN: 16 mg/dL (ref 6–20)
CHLORIDE: 103 mmol/L (ref 101–111)
CO2: 27 mmol/L (ref 22–32)
Calcium: 9.4 mg/dL (ref 8.9–10.3)
Creatinine, Ser: 0.77 mg/dL (ref 0.44–1.00)
Glucose, Bld: 146 mg/dL — ABNORMAL HIGH (ref 65–99)
POTASSIUM: 3.8 mmol/L (ref 3.5–5.1)
SODIUM: 140 mmol/L (ref 135–145)

## 2017-09-25 LAB — PROTEIN S ACTIVITY: Protein S Activity: 98 % (ref 63–140)

## 2017-09-25 LAB — CBC
HEMATOCRIT: 32.4 % — AB (ref 35.0–47.0)
Hemoglobin: 10.6 g/dL — ABNORMAL LOW (ref 12.0–16.0)
MCH: 25.8 pg — ABNORMAL LOW (ref 26.0–34.0)
MCHC: 32.8 g/dL (ref 32.0–36.0)
MCV: 78.6 fL — AB (ref 80.0–100.0)
PLATELETS: 309 10*3/uL (ref 150–440)
RBC: 4.12 MIL/uL (ref 3.80–5.20)
RDW: 17 % — AB (ref 11.5–14.5)
WBC: 13.7 10*3/uL — AB (ref 3.6–11.0)

## 2017-09-25 LAB — ANTITHROMBIN III: ANTITHROMB III FUNC: 104 % (ref 75–120)

## 2017-09-25 LAB — MAGNESIUM: Magnesium: 2.5 mg/dL — ABNORMAL HIGH (ref 1.7–2.4)

## 2017-09-25 LAB — PROCALCITONIN

## 2017-09-25 LAB — PROTEIN S, TOTAL: Protein S Ag, Total: 117 % (ref 60–150)

## 2017-09-25 MED ORDER — FUROSEMIDE 40 MG PO TABS
40.0000 mg | ORAL_TABLET | Freq: Two times a day (BID) | ORAL | Status: DC
  Administered 2017-09-25 – 2017-09-27 (×4): 40 mg via ORAL
  Filled 2017-09-25 (×4): qty 1

## 2017-09-25 MED ORDER — BENZONATATE 100 MG PO CAPS
100.0000 mg | ORAL_CAPSULE | Freq: Four times a day (QID) | ORAL | Status: DC | PRN
  Administered 2017-09-25 – 2017-09-27 (×4): 100 mg via ORAL
  Filled 2017-09-25 (×4): qty 1

## 2017-09-25 NOTE — Progress Notes (Signed)
Patient ID: Brittany Nguyen, female   DOB: 03-Sep-1966, 52 y.o.   MRN: [id number]  [facility] PROGRESS NOTE  Brittany Nguyen [id number]:[id number] DOB: 03/31/1966 DOA: 09/24/2017 PCP: [name], MD  HPI/Subjective: Patient feeling okay.  Breathing a little bit better than yesterday.  Still with cough and shortness of breath but moving better air.  Objective: Vitals:   09/25/17 0805 09/25/17 1034  BP: (!) 106/56 109/60  Pulse: (!) 111 98  Resp:    Temp: 97.8 F (36.6 C)   SpO2: 97%     Filed Weights   09/24/17 0719 09/24/17 1023 09/25/17 0424  Weight: 99.8 kg (220 lb) 99.5 kg (219 lb 5.7 oz) 101.8 kg (224 lb 8 oz)    ROS: Review of Systems  Constitutional: Negative for chills and fever.  Eyes: Negative for blurred vision.  Respiratory: Positive for cough and shortness of breath.   Cardiovascular: Negative for chest pain.  Gastrointestinal: Negative for abdominal pain, constipation, diarrhea, nausea and vomiting.  Genitourinary: Negative for dysuria.  Musculoskeletal: Negative for joint pain.  Neurological: Negative for dizziness and headaches.   Exam: Physical Exam  Constitutional: She is oriented to person, place, and time.  HENT:  Nose: No mucosal edema.  Mouth/Throat: No oropharyngeal exudate or posterior oropharyngeal edema.  Eyes: Conjunctivae, EOM and lids are normal. Pupils are equal, round, and reactive to light.  Neck: No JVD present. Carotid bruit is not present. No edema present. No thyroid mass and no thyromegaly present.  Cardiovascular: S1 normal and S2 normal. Exam reveals no gallop.  No murmur heard. Pulses:      Dorsalis pedis pulses are 2+ on the right side, and 2+ on the left side.  Respiratory: No respiratory distress. She has decreased breath sounds in the right middle field, the right lower field, the left middle field and the left lower field. She has no wheezes. She has rhonchi in the right middle field, the right lower field, the left  middle field and the left lower field. She has no rales.  GI: Soft. Bowel sounds are normal. There is no tenderness.  Musculoskeletal:       Right ankle: She exhibits swelling.       Left ankle: She exhibits swelling.  Lymphadenopathy:    She has no cervical adenopathy.  Neurological: She is alert and oriented to person, place, and time. No cranial nerve deficit.  Skin: Skin is warm. No rash noted. Nails show no clubbing.  Psychiatric: She has a normal mood and affect.      Data Reviewed: Basic Metabolic Panel: Recent Labs  Lab 09/24/17 0728 09/25/17 0454  NA 139 140  K 2.9* 3.8  CL 100* 103  CO2 25 27  GLUCOSE 119* 146*  BUN 15 16  CREATININE 0.98  1.18* 0.77  CALCIUM 9.2 9.4  MG  --  2.5*   Liver Function Tests: Recent Labs  Lab 09/24/17 0728  AST 26  ALT 16  ALKPHOS 108  BILITOT 0.6  PROT 7.9  ALBUMIN 3.7   CBC: Recent Labs  Lab 09/24/17 0728 09/25/17 0454  WBC 11.3* 13.7*  NEUTROABS 8.0*  --   HGB 11.3* 10.6*  HCT 34.6* 32.4*  MCV 78.1* 78.6*  PLT 316 309   Cardiac Enzymes: Recent Labs  Lab 09/24/17 0728  TROPONINI <0.03   BNP (last 3 results) Recent Labs    01/28/17 0836 04/24/17 2022 09/24/17 0728  BNP 181.0* 198.0* 34.0     CBG: Recent Labs  Lab  09/24/17 1025  GLUCAP 128*    Recent Results (from the past 240 hour(s))  Respiratory Panel by PCR     Status: Abnormal   Collection Time: 09/24/17  8:54 AM  Result Value Ref Range Status   Adenovirus NOT DETECTED NOT DETECTED Final   Coronavirus 229E DETECTED (A) NOT DETECTED Final   Coronavirus HKU1 NOT DETECTED NOT DETECTED Final   Coronavirus NL63 NOT DETECTED NOT DETECTED Final   Coronavirus OC43 NOT DETECTED NOT DETECTED Final   Metapneumovirus NOT DETECTED NOT DETECTED Final   Rhinovirus / Enterovirus NOT DETECTED NOT DETECTED Final   Influenza A NOT DETECTED NOT DETECTED Final   Influenza B NOT DETECTED NOT DETECTED Final   Parainfluenza Virus 1 NOT DETECTED NOT DETECTED  Final   Parainfluenza Virus 2 NOT DETECTED NOT DETECTED Final   Parainfluenza Virus 3 NOT DETECTED NOT DETECTED Final   Parainfluenza Virus 4 NOT DETECTED NOT DETECTED Final   Respiratory Syncytial Virus NOT DETECTED NOT DETECTED Final   Bordetella pertussis NOT DETECTED NOT DETECTED Final   Chlamydophila pneumoniae NOT DETECTED NOT DETECTED Final   Mycoplasma pneumoniae NOT DETECTED NOT DETECTED Final    Comment: Performed at [facility], [place] [address] [place], [place] [address]  MRSA PCR Screening     Status: None   Collection Time: 09/24/17 10:22 AM  Result Value Ref Range Status   MRSA by PCR NEGATIVE NEGATIVE Final    Comment:        The GeneXpert MRSA Assay (FDA approved for NASAL specimens only), is one component of a comprehensive MRSA colonization surveillance program. It is not intended to diagnose MRSA infection nor to guide or monitor treatment for MRSA infections. Performed at [facility], [address] [place],  [address]      Studies: US Venous Img Lower Bilateral  Result Date: 09/24/2017 CLINICAL DATA:  Chronic bilateral leg pain.  Edema. EXAM: BILATERAL LOWER EXTREMITY VENOUS DOPPLER ULTRASOUND TECHNIQUE: Gray-scale sonography with graded compression, as well as color Doppler and duplex ultrasound were performed to evaluate the lower extremity deep venous systems from the level of the common femoral vein and including the common femoral, femoral, profunda femoral, popliteal and calf veins including the posterior tibial, peroneal and gastrocnemius veins when visible. The superficial great saphenous vein was also interrogated. Spectral Doppler was utilized to evaluate flow at rest and with distal augmentation maneuvers in the common femoral, femoral and popliteal veins. COMPARISON:  12/14/2012 FINDINGS: RIGHT LOWER EXTREMITY Common Femoral Vein: No evidence of thrombus. Normal compressibility, respiratory phasicity and response to  augmentation. Saphenofemoral Junction: No evidence of thrombus. Normal compressibility and flow on color Doppler imaging. Profunda Femoral Vein: No evidence of thrombus. Normal compressibility and flow on color Doppler imaging. Femoral Vein: No evidence of thrombus. Normal compressibility, respiratory phasicity and response to augmentation. Popliteal Vein: No evidence of thrombus. Normal compressibility, respiratory phasicity and response to augmentation. Calf Veins: No evidence of thrombus. Normal compressibility and flow on color Doppler imaging. LEFT LOWER EXTREMITY Common Femoral Vein: No evidence of thrombus. Normal compressibility, respiratory phasicity and response to augmentation. Saphenofemoral Junction: No evidence of thrombus. Normal compressibility and flow on color Doppler imaging. Profunda Femoral Vein: No evidence of thrombus. Normal compressibility and flow on color Doppler imaging. Femoral Vein: No evidence of thrombus. Normal compressibility, respiratory phasicity and response to augmentation. Popliteal Vein: No evidence of thrombus. Normal compressibility, respiratory phasicity and response to augmentation. Calf Veins: No evidence of thrombus. Normal compressibility and flow  on color Doppler imaging. IMPRESSION: No evidence of deep venous thrombosis in the lower extremities. Electronically Signed   By: Markus Daft M.D.   On: 09/24/2017 12:10   Dg Chest Port 1 View  Result Date: 09/24/2017 CLINICAL DATA:  52 year old female with history of cough and congestion for the past 3-4 days. Increasing shortness of breath with central chest pain. EXAM: PORTABLE CHEST 1 VIEW COMPARISON:  Chest x-ray 07/03/2017. FINDINGS: Lung volumes are normal. No consolidative airspace disease. No pleural effusions. No pneumothorax. No pulmonary nodule or mass noted. Pulmonary vasculature and the cardiomediastinal silhouette are within normal limits. Left-sided pacemaker/AICD in place with lead tips projecting over the  expected location of the right atrium and right ventricular apex. IMPRESSION: 1.  No radiographic evidence of acute cardiopulmonary disease. Electronically Signed   By: Vinnie Langton M.D.   On: 09/24/2017 08:00    Scheduled Meds: . beclomethasone  2 puff Inhalation BID  . cyanocobalamin  500 mcg Oral Daily  . enoxaparin (LOVENOX) injection  40 mg Subcutaneous Q24H  . furosemide  40 mg Oral BID  . ipratropium-albuterol  3 mL Nebulization Q6H  . metoprolol succinate  50 mg Oral Daily  . potassium chloride SA  20 mEq Oral Daily  . predniSONE  40 mg Oral Q breakfast  . rOPINIRole  4 mg Oral QHS  . sodium chloride flush  3 mL Intravenous Q12H   Continuous Infusions:  Assessment/Plan:  1. Acute respiratory failure with tachypnea.  Patient initially required BiPAP on presentation. 2. Coronavirus triggering asthma exacerbation.  On prednisone and nebulizer treatments and Qvar 3. Relative hypotension.  Cut back on the dosages of Lasix and metoprolol.  Holding losartan and Spironolactone. 4. Hypokalemia this was replaced orally and magnesium given yesterday. 5. History of systolic congestive heart failure with EF 30-35%.  This is not an acute exacerbation of CHF. 6. Restless leg syndrome on Requip 7. Sleep apnea on CPAP at night  Code Status:     Code Status Orders  (From admission, onward)        Start     Ordered   09/24/17 0848  Full code  Continuous     09/24/17 0847    Code Status History    Date Active Date Inactive Code Status Order ID Comments User Context   07/03/2017 06:54 07/05/2017 12:48 Full Code [id number]  Saundra Shelling, MD ED   04/25/2017 01:46 04/26/2017 15:16 Full Code [id number]  Harvie Bridge, DO ED   01/28/2017 10:33 01/30/2017 17:41 Full Code [id number]  [name], MD Inpatient   01/22/2017 01:14 01/24/2017 17:59 Full Code [id number]  Lance Coon, MD Inpatient   12/09/2016 02:09 12/09/2016 15:07 Full Code [id number]  Lance Coon, MD Inpatient   11/21/2015 06:50  11/22/2015 15:50 Full Code [id number]  Saundra Shelling, MD Inpatient   09/19/2015 05:11 09/21/2015 18:29 Full Code [id number]  Harrie Foreman, MD Inpatient     Disposition Plan: Home once breathing better  Time spent: 27 minutes  [place]

## 2017-09-25 NOTE — Plan of Care (Signed)
  Progressing Education: Knowledge of General Education information will improve 09/25/2017 1128 - Progressing by Gildardo Pounds, RN Health Behavior/Discharge Planning: Ability to manage health-related needs will improve 09/25/2017 1128 - Progressing by Gildardo Pounds, RN

## 2017-09-25 NOTE — Progress Notes (Signed)
Pt took self off the bipap ,stated it is not comfortable

## 2017-09-26 LAB — PROTEIN C ACTIVITY: PROTEIN C ACTIVITY: 127 % (ref 73–180)

## 2017-09-26 LAB — LUPUS ANTICOAGULANT PANEL
DRVVT: 33.6 s (ref 0.0–47.0)
PTT LA: 28.6 s (ref 0.0–51.9)

## 2017-09-26 LAB — BETA-2-GLYCOPROTEIN I ABS, IGG/M/A
Beta-2 Glyco I IgG: <9 GPI IgG units (ref 0–20)
Beta-2-Glycoprotein I IgM: <9 GPI IgM units (ref 0–32)

## 2017-09-26 LAB — CARDIOLIPIN ANTIBODIES, IGG, IGM, IGA
Anticardiolipin IgA: <9 APL U/mL (ref 0–11)
Anticardiolipin IgG: <9 GPL U/mL (ref 0–14)
Anticardiolipin IgM: 21 MPL U/mL — ABNORMAL HIGH (ref 0–12)

## 2017-09-26 LAB — PROCALCITONIN: Procalcitonin: <0.1 ng/mL

## 2017-09-26 LAB — PROTEIN C, TOTAL: PROTEIN C, TOTAL: 115 % (ref 60–150)

## 2017-09-26 LAB — HOMOCYSTEINE: HOMOCYSTEINE-NORM: 10.9 umol/L (ref 0.0–15.0)

## 2017-09-26 MED ORDER — IPRATROPIUM-ALBUTEROL 0.5-2.5 (3) MG/3ML IN SOLN
3.0000 mL | Freq: Two times a day (BID) | RESPIRATORY_TRACT | Status: DC
  Administered 2017-09-27: 3 mL via RESPIRATORY_TRACT
  Filled 2017-09-26: qty 3

## 2017-09-26 MED ORDER — METHYLPREDNISOLONE SODIUM SUCC 40 MG IJ SOLR
40.0000 mg | Freq: Every day | INTRAMUSCULAR | Status: DC
  Administered 2017-09-26 – 2017-09-27 (×2): 40 mg via INTRAVENOUS
  Filled 2017-09-26 (×2): qty 1

## 2017-09-26 NOTE — Plan of Care (Signed)
  Progressing Education: Knowledge of General Education information will improve 09/26/2017 2304 - Progressing by Loran Senters, RN Activity: Risk for activity intolerance will decrease 09/26/2017 2304 - Progressing by Loran Senters, RN Pain Managment: General experience of comfort will improve 09/26/2017 2304 - Progressing by Loran Senters, RN Safety: Ability to remain free from injury will improve 09/26/2017 2304 - Progressing by Loran Senters, RN

## 2017-09-26 NOTE — Progress Notes (Signed)
Tolerated svn treatment well. Refused bipap earlier.

## 2017-09-26 NOTE — Progress Notes (Signed)
Patient ID: Lenay Lovejoy, female   DOB: [birth year], 52 y.o.   MRN: [id number]  [facility] PROGRESS NOTE  Connelly Spruell Boggio ZWC:[id number] DOB: [birth year] DOA: 09/24/2017 PCP: [name], MD  HPI/Subjective: Patient feeling better.  Still coughing quite a bit.  Nonproductive.  Objective: Vitals:   09/26/17 0743 09/26/17 1317  BP:    Pulse:    Resp:    Temp:    SpO2: 98% 98%    Filed Weights   09/24/17 1023 09/25/17 0424 09/26/17 0332  Weight: 99.5 kg (219 lb 5.7 oz) 101.8 kg (224 lb 8 oz) 102.1 kg (225 lb 1.6 oz)    ROS: Review of Systems  Constitutional: Negative for chills and fever.  Eyes: Negative for blurred vision.  Respiratory: Positive for cough and shortness of breath.   Cardiovascular: Negative for chest pain.  Gastrointestinal: Negative for abdominal pain, constipation, diarrhea, nausea and vomiting.  Genitourinary: Negative for dysuria.  Musculoskeletal: Negative for joint pain.  Neurological: Negative for dizziness and headaches.   Exam: Physical Exam  Constitutional: She is oriented to person, place, and time.  HENT:  Nose: No mucosal edema.  Mouth/Throat: No oropharyngeal exudate or posterior oropharyngeal edema.  Eyes: Conjunctivae, EOM and lids are normal. Pupils are equal, round, and reactive to light.  Neck: No JVD present. Carotid bruit is not present. No edema present. No thyroid mass and no thyromegaly present.  Cardiovascular: S1 normal and S2 normal. Exam reveals no gallop.  No murmur heard. Pulses:      Dorsalis pedis pulses are 2+ on the right side, and 2+ on the left side.  Respiratory: No respiratory distress. She has decreased breath sounds in the right lower field and the left lower field. She has no wheezes. She has rhonchi in the right lower field and the left lower field. She has no rales.  GI: Soft. Bowel sounds are normal. There is no tenderness.  Musculoskeletal:       Right ankle: She exhibits swelling.       Left  ankle: She exhibits swelling.  Lymphadenopathy:    She has no cervical adenopathy.  Neurological: She is alert and oriented to person, place, and time. No cranial nerve deficit.  Skin: Skin is warm. No rash noted. Nails show no clubbing.  Psychiatric: She has a normal mood and affect.      Data Reviewed: Basic Metabolic Panel: Recent Labs  Lab 09/24/17 0728 09/25/17 0454  NA 139 140  K 2.9* 3.8  CL 100* 103  CO2 25 27  GLUCOSE 119* 146*  BUN 15 16  CREATININE 0.98  1.18* 0.77  CALCIUM 9.2 9.4  MG  --  2.5*   Liver Function Tests: Recent Labs  Lab 09/24/17 0728  AST 26  ALT 16  ALKPHOS 108  BILITOT 0.6  PROT 7.9  ALBUMIN 3.7   CBC: Recent Labs  Lab 09/24/17 0728 09/25/17 0454  WBC 11.3* 13.7*  NEUTROABS 8.0*  --   HGB 11.3* 10.6*  HCT 34.6* 32.4*  MCV 78.1* 78.6*  PLT 316 309   Cardiac Enzymes: Recent Labs  Lab 09/24/17 0728  TROPONINI <0.03   BNP (last 3 results) Recent Labs    01/28/17 0836 04/24/17 2022 09/24/17 0728  BNP 181.0* 198.0* 34.0     CBG: Recent Labs  Lab 09/24/17 1025  GLUCAP 128*    Recent Results (from the past 240 hour(s))  Respiratory Panel by PCR     Status: Abnormal   Collection Time: 09/24/17  8:54 AM  Result Value Ref Range Status   Adenovirus NOT DETECTED NOT DETECTED Final   Coronavirus 229E DETECTED (A) NOT DETECTED Final   Coronavirus HKU1 NOT DETECTED NOT DETECTED Final   Coronavirus NL63 NOT DETECTED NOT DETECTED Final   Coronavirus OC43 NOT DETECTED NOT DETECTED Final   Metapneumovirus NOT DETECTED NOT DETECTED Final   Rhinovirus / Enterovirus NOT DETECTED NOT DETECTED Final   Influenza A NOT DETECTED NOT DETECTED Final   Influenza B NOT DETECTED NOT DETECTED Final   Parainfluenza Virus 1 NOT DETECTED NOT DETECTED Final   Parainfluenza Virus 2 NOT DETECTED NOT DETECTED Final   Parainfluenza Virus 3 NOT DETECTED NOT DETECTED Final   Parainfluenza Virus 4 NOT DETECTED NOT DETECTED Final   Respiratory  Syncytial Virus NOT DETECTED NOT DETECTED Final   Bordetella pertussis NOT DETECTED NOT DETECTED Final   Chlamydophila pneumoniae NOT DETECTED NOT DETECTED Final   Mycoplasma pneumoniae NOT DETECTED NOT DETECTED Final    Comment: Performed at [facility], [place] [address] [place], [place] [address]  MRSA PCR Screening     Status: None   Collection Time: 09/24/17 10:22 AM  Result Value Ref Range Status   MRSA by PCR NEGATIVE NEGATIVE Final    Comment:        The GeneXpert MRSA Assay (FDA approved for NASAL specimens only), is one component of a comprehensive MRSA colonization surveillance program. It is not intended to diagnose MRSA infection nor to guide or monitor treatment for MRSA infections. Performed at [facility], [address] [place], [place] [address]       Scheduled Meds: . beclomethasone  2 puff Inhalation BID  . cyanocobalamin  500 mcg Oral Daily  . enoxaparin (LOVENOX) injection  40 mg Subcutaneous Q24H  . furosemide  40 mg Oral BID  . ipratropium-albuterol  3 mL Nebulization Q6H  . methylPREDNISolone (SOLU-MEDROL) injection  40 mg Intravenous Daily  . metoprolol succinate  50 mg Oral Daily  . potassium chloride SA  20 mEq Oral Daily  . rOPINIRole  4 mg Oral QHS  . sodium chloride flush  3 mL Intravenous Q12H   Continuous Infusions:  Assessment/Plan:  1. Acute respiratory failure with tachypnea.  Patient initially required BiPAP on presentation.  Now breathing on room air 2. Coronavirus triggering asthma exacerbation.  Switch prednisone back to Solu-Medrol.  Continue nebulizer treatments and Qvar 3. Relative hypotension.  Cut back on the dosages of Lasix and metoprolol.  Holding losartan and Spironolactone. 4. Hypokalemia was replaced 5. History of systolic congestive heart failure with EF 30-35%.  This is not an acute exacerbation of CHF. 6. Restless leg syndrome on Requip 7. Sleep apnea on CPAP at night  Code Status:     Code  Status Orders  (From admission, onward)        Start     Ordered   09/24/17 0848  Full code  Continuous     09/24/17 0847    Code Status History    Date Active Date Inactive Code Status Order ID Comments User Context   07/03/2017 06:54 07/05/2017 12:48 Full Code [id number]  Saundra Shelling, MD ED   04/25/2017 01:46 04/26/2017 15:16 Full Code [id number]  Harvie Bridge, DO ED   01/28/2017 10:33 01/30/2017 17:41 Full Code [id number]  [name], MD Inpatient   01/22/2017 01:14 01/24/2017 17:59 Full Code [id number]  Lance Coon, MD Inpatient   12/09/2016 02:09 12/09/2016 15:07 Full Code [id number]  Lance Coon, MD Inpatient  11/21/2015 06:50 11/22/2015 15:50 Full Code [id number]  Saundra Shelling, MD Inpatient   09/19/2015 05:11 09/21/2015 18:29 Full Code [id number]  Harrie Foreman, MD Inpatient     Disposition Plan: Home once breathing better.  Evaluate on a daily basis  Time spent: 26 minutes  [place]

## 2017-09-26 NOTE — Progress Notes (Signed)
Per Hubert Azure, Patient does not need to be on isolation precautions for coronavirus

## 2017-09-26 NOTE — Progress Notes (Signed)
Patient states she feels that her heart is beating fast.  HR normal on telemetry.  She states it may be her breathing treatments or steroids.  Put in a message to pharmacy to possibly switch breathing treatments to xopenex.

## 2017-09-27 LAB — BASIC METABOLIC PANEL
Anion gap: 6 (ref 5–15)
BUN: 20 mg/dL (ref 6–20)
CALCIUM: 8.9 mg/dL (ref 8.9–10.3)
CO2: 31 mmol/L (ref 22–32)
Chloride: 101 mmol/L (ref 101–111)
Creatinine, Ser: 0.9 mg/dL (ref 0.44–1.00)
Glucose, Bld: 128 mg/dL — ABNORMAL HIGH (ref 65–99)
Potassium: 4.3 mmol/L (ref 3.5–5.1)
Sodium: 138 mmol/L (ref 135–145)

## 2017-09-27 MED ORDER — PREDNISONE 10 MG PO TABS: ORAL_TABLET | ORAL | 0 refills | Status: DC

## 2017-09-27 MED ORDER — HYDROCOD POLST-CPM POLST ER 10-8 MG/5ML PO SUER: 5.0000 mL | Freq: Two times a day (BID) | ORAL | 0 refills | Status: DC | PRN

## 2017-09-27 MED ORDER — METOPROLOL SUCCINATE ER 50 MG PO TB24: 50.0000 mg | ORAL_TABLET | Freq: Every day | ORAL | 0 refills | Status: DC

## 2017-09-27 MED ORDER — OXYCODONE-ACETAMINOPHEN 5-325 MG PO TABS: 1.0000 | ORAL_TABLET | Freq: Four times a day (QID) | ORAL | 0 refills | Status: DC | PRN

## 2017-09-27 MED ORDER — POTASSIUM CHLORIDE CRYS ER 20 MEQ PO TBCR: 20.0000 meq | EXTENDED_RELEASE_TABLET | Freq: Every day | ORAL | 0 refills | Status: DC

## 2017-09-27 MED ORDER — FUROSEMIDE 40 MG PO TABS: 40.0000 mg | ORAL_TABLET | Freq: Two times a day (BID) | ORAL | Status: DC

## 2017-09-27 NOTE — Progress Notes (Signed)
Patient ID: Brittany Nguyen, female   DOB: 1966-06-04, 52 y.o.   MRN: [id number] [place] [facility] [place] was admitted to the Hospital on 09/24/2017 and Discharged  09/27/2017 and should be excused from work/school   for 8  days starting 09/24/2017 , may return to work/school without any restrictions.  Loletha Grayer M.D on 09/27/2017,at 8:17 AM  [place] [facility]  [phone]

## 2017-09-27 NOTE — Discharge Instructions (Signed)
Prednisone tablets °What is this medicine? °PREDNISONE (PRED ni sone) is a corticosteroid. It is commonly used to treat inflammation of the skin, joints, lungs, and other organs. Common conditions treated include asthma, allergies, and arthritis. It is also used for other conditions, such as blood disorders and diseases of the adrenal glands. °This medicine may be used for other purposes; ask your health care provider or pharmacist if you have questions. °COMMON BRAND NAME(S): Deltasone, Predone, Sterapred, Sterapred DS °What should I tell my health care provider before I take this medicine? °They need to know if you have any of these conditions: °-Cushing's syndrome °-diabetes °-glaucoma °-heart disease °-high blood pressure °-infection (especially a virus infection such as chickenpox, cold sores, or herpes) °-kidney disease °-liver disease °-mental illness °-myasthenia gravis °-osteoporosis °-seizures °-stomach or intestine problems °-thyroid disease °-an unusual or allergic reaction to lactose, prednisone, other medicines, foods, dyes, or preservatives °-pregnant or trying to get pregnant °-breast-feeding °How should I use this medicine? °Take this medicine by mouth with a glass of water. Follow the directions on the prescription label. Take this medicine with food. If you are taking this medicine once a day, take it in the morning. Do not take more medicine than you are told to take. Do not suddenly stop taking your medicine because you may develop a severe reaction. Your doctor will tell you how much medicine to take. If your doctor wants you to stop the medicine, the dose may be slowly lowered over time to avoid any side effects. °Talk to your pediatrician regarding the use of this medicine in children. Special care may be needed. °Overdosage: If you think you have taken too much of this medicine contact a poison control center or emergency room at once. °NOTE: This medicine is only for you. Do not share this  medicine with others. °What if I miss a dose? °If you miss a dose, take it as soon as you can. If it is almost time for your next dose, talk to your doctor or health care professional. You may need to miss a dose or take an extra dose. Do not take double or extra doses without advice. °What may interact with this medicine? °Do not take this medicine with any of the following medications: °-metyrapone °-mifepristone °This medicine may also interact with the following medications: °-aminoglutethimide °-amphotericin B °-aspirin and aspirin-like medicines °-barbiturates °-certain medicines for diabetes, like glipizide or glyburide °-cholestyramine °-cholinesterase inhibitors °-cyclosporine °-digoxin °-diuretics °-ephedrine °-female hormones, like estrogens and birth control pills °-isoniazid °-ketoconazole °-NSAIDS, medicines for pain and inflammation, like ibuprofen or naproxen °-phenytoin °-rifampin °-toxoids °-vaccines °-warfarin °This list may not describe all possible interactions. Give your health care provider a list of all the medicines, herbs, non-prescription drugs, or dietary supplements you use. Also tell them if you smoke, drink alcohol, or use illegal drugs. Some items may interact with your medicine. °What should I watch for while using this medicine? °Visit your doctor or health care professional for regular checks on your progress. If you are taking this medicine over a prolonged period, carry an identification card with your name and address, the type and dose of your medicine, and your doctor's name and address. °This medicine may increase your risk of getting an infection. Tell your doctor or health care professional if you are around anyone with measles or chickenpox, or if you develop sores or blisters that do not heal properly. °If you are going to have surgery, tell your doctor or health care professional that   you have taken this medicine within the last twelve months. Ask your doctor or health  care professional about your diet. You may need to lower the amount of salt you eat. This medicine may affect blood sugar levels. If you have diabetes, check with your doctor or health care professional before you change your diet or the dose of your diabetic medicine. What side effects may I notice from receiving this medicine? Side effects that you should report to your doctor or health care professional as soon as possible: -allergic reactions like skin rash, itching or hives, swelling of the face, lips, or tongue -changes in emotions or moods -changes in vision -depressed mood -eye pain -fever or chills, cough, sore throat, pain or difficulty passing urine -increased thirst -swelling of ankles, feet Side effects that usually do not require medical attention (report to your doctor or health care professional if they continue or are bothersome): -confusion, excitement, restlessness -headache -nausea, vomiting -skin problems, acne, thin and shiny skin -trouble sleeping -weight gain This list may not describe all possible side effects. Call your doctor for medical advice about side effects. You may report side effects to FDA at 1-800-FDA-1088. Where should I keep my medicine? Keep out of the reach of children. Store at room temperature between 15 and 30 degrees C (59 and 86 degrees F). Protect from light. Keep container tightly closed. Throw away any unused medicine after the expiration date. NOTE: This sheet is a summary. It may not cover all possible information. If you have questions about this medicine, talk to your doctor, pharmacist, or health care provider.  2018 Elsevier/Gold Standard (2011-04-07 10:57:14) Chlorpheniramine; Hydrocodone oral solution or suspension What is this medicine? CHLORPHENIRAMINE; HYDROCODONE (klor fen IR a meen; hye droe KOE done) is a combination of an antihistamine and cough suppressant. It is used to treat the symptoms of allergies and colds. This  medicine may be used for other purposes; ask your health care provider or pharmacist if you have questions. COMMON BRAND NAME(S): HyTan, Novasus, S-T Forte 2, Tussionex, VITUZ What should I tell my health care provider before I take this medicine? They need to know if you have any of these conditions: -diabetes -drug abuse or addiction -head injury -heart disease -kidney disease -liver disease -lung or breathing disease, like asthma -problems urinating -seizures -stomach or intestine problems -an unusual or allergic reaction to chlorpheniramine, hydrocodone, other medicines, foods, dyes, or preservatives -pregnant or trying to get pregnant -breast-feeding How should I use this medicine? Take this medicine by mouth. Follow the directions on the prescription label. Shake well before using. Use a specially marked spoon or container to measure each dose. Household spoons are not accurate. You can take it with or without food. If it upsets your stomach, take it with food. Take your medicine at regular intervals. Do not take it more often than directed. A special MedGuide will be given to you by the pharmacist with each prescription and refill. Be sure to read this information carefully each time. Talk to your pediatrician regarding the use of this medicine in children. This medicine is not approved for use in children. Overdosage: If you think you have taken too much of this medicine contact a poison control center or emergency room at once. NOTE: This medicine is only for you. Do not share this medicine with others. What if I miss a dose? If you miss a dose, take it as soon as you can. If it is almost time for your next dose,  take only that dose. Do not take double or extra doses. What may interact with this medicine? Do not take this medicine with any of the following medications: -alcohol -certain medicines for anxiety or sleep -certain medicines for depression like amitriptyline,  fluoxetine, sertraline -certain medicines for seizures like phenobarbital, primidone -general anesthetics like halothane, isoflurane, methoxyflurane, propofol -local anesthetics like lidocaine, pramoxine, tetracaine -MAOIs like Carbex, Eldepryl, Nardil, and Parnate -medicines that relax muscles for surgery -other antihistamines for allergy, cough and cold -other narcotic medicines for pain or cough -phenothiazines like chlorpromazine, mesoridazine, prochlorperazine, thioridazine This medicine may also interact with the following medications: -antiviral medicines for HIV or AIDS -atropine -certain antibiotics like clarithromycin, erythromycin -certain medicines for bladder problems like oxybutynin, tolterodine -certain medicines for fungal infections like ketoconazole and itraconazole -certain medicines for Parkinson's disease like benztropine, trihexyphenidyl -certain medicines for stomach problems like dicyclomine, hyoscyamine -certain medicines for travel sickness like scopolamine -ipratropium -rifampin This list may not describe all possible interactions. Give your health care provider a list of all the medicines, herbs, non-prescription drugs, or dietary supplements you use. Also tell them if you smoke, drink alcohol, or use illegal drugs. Some items may interact with your medicine. What should I watch for while using this medicine? Use exactly as directed by your doctor or health care professional. Do not take more than the recommended dose. You may develop tolerance to this medicine if you take it for a long time. Tolerance means that you will get less cough relief with time. Tell your doctor or health care professional if your symptoms do not improve or if they get worse. If you have been taking this medicine for a long time, do not suddenly stop taking it because you may develop a severe reaction. Your body becomes used to the medicine. This does NOT mean you are addicted. Addiction is  a behavior related to getting and using a drug for a nonmedical reason. If your doctor wants you to stop the medicine, the dose will be slowly lowered over time to avoid any side effects. There are different types of narcotic medicines (opiates). If you take more than one type at the same time or if you are taking another medicine that also causes drowsiness, you may have more side effects. Give your health care provider a list of all medicines you use. Your doctor will tell you how much medicine to take. Do not take more medicine than directed. Call emergency for help if you have problems breathing or unusual sleepiness. You may get drowsy or dizzy. Do not drive, use machinery, or do anything that needs mental alertness until you know how this medicine affects you. Do not stand or sit up quickly, especially if you are an older patient. This reduces the risk of dizzy or fainting spells. Alcohol may interfere with the effect of this medicine. Avoid alcoholic drinks. The medicine will cause constipation. Try to have a bowel movement at least every 2 to 3 days. If you do not have a bowel movement for 3 days, call your doctor or health care professional. Your mouth may get dry. Chewing sugarless gum or sucking hard candy, and drinking plenty of water may help. Contact your doctor if the problem does not go away or is severe. This medicine may cause dry eyes and blurred vision. If you wear contact lenses, you may feel some discomfort. Lubricating drops may help. See your eye doctor if the problem does not go away or is severe. What side effects  may I notice from receiving this medicine? Side effects that you should report to your doctor or health care professional as soon as possible: -allergic reactions like skin rash, itching or hives, swelling of the face, lips, or tongue -breathing problems -confusion -signs and symptoms of low blood pressure like dizziness; feeling faint or lightheaded, falls; unusually  weak or tired -trouble passing urine or change in the amount of urine Side effects that usually do not require medical attention (report to your doctor or health care professional if they continue or are bothersome): -constipation -dry mouth -nausea, vomiting -tiredness This list may not describe all possible side effects. Call your doctor for medical advice about side effects. You may report side effects to FDA at 1-800-FDA-1088. Where should I keep my medicine? Keep out of the reach of children. This medicine can be abused. Keep this medicine in a safe place to protect it from theft. Do not share this medicine with anyone. Selling or giving away this medicine is dangerous and is against the law. This medicine may cause accidental overdose and death if taken by other adults, children, or pets. Mix any unused medicine with a substance like cat littler or coffee grounds. Then throw the medicine away in a sealed container like a sealed bag or a coffee can with a lid. Do not use the medicine after the expiration date. Store at room temperature between 15 and 30 degrees C (59 and 86 degrees F). Do not freeze. Keep container tightly closed. NOTE: This sheet is a summary. It may not cover all possible information. If you have questions about this medicine, talk to your doctor, pharmacist, or health care provider.  2018 Elsevier/Gold Standard (2016-09-15 22:55:41) Pain Medicine Instructions How can pain medicine affect me? You were prescribed pain medicine. This medicine may:  Make you tired or sleepy.  Make you feel dizzy.  Affect how well you can: ? Drive ? Do certain activities.  Pain medicine may not make all of your pain go away. You should be comfortable enough to:  Move.  Breathe.  Take care of yourself.  How often should I take pain medicine and how much should I take?  Take pain medicine only as told by your doctor and only as needed for pain.  You do not need to take pain  medicine if you are not having pain, unless your doctor tells you to do that.  You can take less than the prescribed dose if you find that less medicine helps your pain.  If you have very bad (severe) pain, call your doctor. Do not take more pills than told by your doctor. Do not take pills more often than told by your doctor. What should I avoid while I am taking pain medicine? Follow these instructions after you start taking pain medicine, while you are taking the medicine, and for 8 hours after you stop taking the medicine:  Do not drive.  Do not use machinery.  Do not use power tools.  Do not sign legal documents.  Do not drink alcohol.  Do not take sleeping pills.  Do not take care of children by yourself.  Do not do any activities that involve climbing or being in high places.  Do not go into any body of water unless there is an adult nearby who can watch and help you. This includes: ? [place]. ? Rivers. ? Oceans. ? Spas. ? Swimming pools.  How can I keep others safe while I am taking pain medicine?  Store your pain medicine as told by your doctor. Make sure that you keep it where children and pets cannot reach it.  Do not share your pain medicine with anyone.  Do not save any leftover pills. If you have any leftover pain medicine, get rid of it or destroy it as told by your doctor. What else do I need to know about taking pain medicine?  Use a poop (stool) softener if you have trouble pooping (constipation) because of your pain medicine. Eating more fruits and vegetables also helps with constipation.  Write down the times when you take your pain medicine. Look at the times before you take your next dose of medicine.  Your pain medicine might have acetaminophen in it. Do not take any other acetaminophen while you are taking this medicine. An overdose of acetaminophen can do very bad damage to your liver. If you are taking any medicines in addition to your pain  medicine, check the active ingredients on those medicines to see if acetaminophen is listed. When should I call my doctor?  Your medicine is not helping the pain.  You do either of these soon after you take the medicine: ? Throw up (vomit). ? Have watery poop (diarrhea).  You have new pain in areas that did not hurt before.  You have an allergic reaction to your medicine. This may include: ? Feeling itchy. ? Swelling. ? Feeling dizzy. ? Getting a new rash.  You cannot put up with feeling: ? Dizzy. ? Sick to your stomach (nauseous). When should I call 911 or go to the emergency room?  You pass out (faint).  You feel very confused.  You throw up again and again.  Your skin or lips turn pale or bluish in color.  You are: ? Short of breath. ? Breathing much more slowly than usual.  You have a very bad allergic reaction to your medicine. This includes: ? Developing a swollen tongue. ? Having trouble breathing. This information is not intended to replace advice given to you by your health care provider. Make sure you discuss any questions you have with your health care provider. Document Released: 02/08/2008 Document Revised: 04/28/2016 Document Reviewed: 06/26/2014 Elsevier Interactive Patient Education  2018 [place] like bacteria, viruses, and parasites are found everywhere. They can be in the air and water, and they can be on surfaces like food, door handles, and your skin. Every day, your hands come into contact with germs, many of which can make you and your family sick. Washing your hands is one of the easiest and most effective ways to reduce your risk of contracting and sharing germs. When should I wash my hands? You should wash your hands whenever you think they are dirty. You should also wash your hands:  Before: ? Visiting a baby or anyone with a weakened or lowered defense (immune) system. ? Putting in and taking out any contact  lenses.  After: ? Working or playing outside. ? Touching an animal or its toys or leash. ? Handling livestock. ? Using the bathroom. ? Using household cleaners or toxic chemicals. ? Touching or taking out the garbage. ? Touching anything dirty around your home. ? Handling soiled clothes or rags. ? Taking care of a sick child. This includes touching used tissues, toys, and clothes. ? Sneezing, coughing, or blowing your nose. ? Using public transportation. ? Shaking hands. ? Using a phone, including your mobile phone. ? Touching money.  Before and after: ?  Preparing food. ? Preparing a bottle for a baby. ? Feeding a baby or young child. ? Eating. ? Visiting or taking care of someone who is sick. ? Changing a diaper. ? Changing a bandage (dressing) or taking care of an injury or wound. ? Giving or taking medicine.  What is the correct way to wash my hands?  Wet your hands with clean, running water.  Apply liquid soap or bar soap to your hands.  Rub your hands together quickly to create lather.  Keep rubbing your hands together for at least 20 seconds. Thoroughly scrub all parts of your hands, including under your fingernails and between your fingers.  Rinse your hands with clean, running water until all the soap is gone.  Dry your hands using an air dryer or a clean paper or cloth towel, or let your hands air-dry. Do not use your clothing or a soiled towel to dry your hands.  If you are in a public restroom, use your towel: ? To turn off the water faucet. ? To open the bathroom door. How can I clean my hands if I do not have soap and water? If soap and clean water are not available, use an alcohol-based wipe, spray, or hand gel. Use a hand-sanitizing agent that contains at least 60% alcohol. If you are preparing food, hand sanitizers are not recommended as a substitute for hand washing. To use a hand sanitizer, follow the directions provided on the product, and:  Apply  an adequate amount of the product to your hands.  Make sure you wipe, rub, or spray the product so that it reaches every part of your hands and wrists. Include the backs of your hands, between your fingers, and under your fingernails.  Rub the product onto your hands until it dries.  This information is not intended to replace advice given to you by your health care provider. Make sure you discuss any questions you have with your health care provider. Document Released: 04/12/2005 Document Revised: 01/20/2016 Document Reviewed: 01/17/2014 Elsevier Interactive Patient Education  [employer].

## 2017-09-27 NOTE — Progress Notes (Signed)
Patient discharged via wheelchair and private vehicle. No complaints or distress noted at time of discharge. Tele box off and returned. Prescriptions given. Patient verbalized understanding of discharge paperwork.

## 2017-09-27 NOTE — Discharge Summary (Signed)
[name] NAME: Brittany Nguyen    MR#:  [id number]  DATE OF BIRTH:  [birth year]  DATE OF ADMISSION:  09/24/2017 ADMITTING PHYSICIAN: Loletha Grayer, MD  DATE OF DISCHARGE: 09/27/2017 11:04 AM  PRIMARY CARE PHYSICIAN: [name], MD    ADMISSION DIAGNOSIS:  Acute respiratory distress [R06.03] Acute pulmonary edema ([place]) [J81.0] Pain [R52] Acute on chronic congestive heart failure, unspecified heart failure type ([place]) [I50.9]  DISCHARGE DIAGNOSIS:  Active Problems:   Acute respiratory failure ([place])   SECONDARY DIAGNOSIS:   Past Medical History:  Diagnosis Date  . Asthma 2013  . CHF (congestive heart failure) ([place])   . Diverticulitis 2010  . DVT (deep venous thrombosis) ([place])   . Endometriosis 1990  . Heart disease 2013  . Hx MRSA infection   . Lump or mass in breast   . Restless leg     HOSPITAL COURSE:   1.  Acute respiratory failure with tachypnea.  Patient initially required BiPAP on presentation.  Now breathing on room air. 2.  Coronavirus triggering asthma exacerbation.  Patient was given Solu-Medrol during the hospital course and will be given prednisone upon going home for a few more days.  Continue Qvar and albuterol inhaler at home. 3.  Relative hypotension.  I had a cut back on the dosages of Lasix and metoprolol.  I held losartan and Spironolactone.  Follow-up as outpatient if blood pressure starts coming back up can potentially restart these medications and lower doses. 4.  Hypokalemia this was replaced. 5.  History of systolic congestive heart failure with EF of 30-35%.  This is not an acute exacerbation of CHF. 6.  Restless leg syndrome on Requip 7.  Sleep apnea on CPAP at night  DISCHARGE CONDITIONS:   Satisfactory  CONSULTS OBTAINED:  Patient was seen by the critical care specialist while in the ICU on BiPAP  DRUG ALLERGIES:   Allergies  Allergen Reactions  . Contrast Media [Iodinated  Diagnostic Agents] Shortness Of Breath  . Metrizamide Shortness Of Breath  . Penicillins Hives and Other (See Comments)    Other reaction(s): Other (See Comments) Has patient had a PCN reaction causing immediate rash, facial/tongue/throat swelling, SOB or lightheadedness with hypotension: Yes Has patient had a PCN reaction causing severe rash involving mucus membranes or skin necrosis: No Has patient had a PCN reaction that required hospitalization No Has patient had a PCN reaction occurring within the last 10 years: No If all of the above answers are "NO", then may proceed with Cephalosporin use. Has patient had a PCN reaction causing immediate rash, facial/tongue/throat swelling, SOB or lightheadedness with hypotension: Yes Has patient had a PCN reaction causing severe rash involving mucus membranes or skin necrosis: No Has patient had a PCN reaction that required hospitalization No Has patient had a PCN reaction occurring within the last 10 years: No If all of the above answers are "NO", then may proceed with Cephalosporin use.   . Isosorbide Nitrate     Other reaction(s): Headache  . Latex Hives  . Ondansetron Other (See Comments)    Severe headache  . Povidone-Iodine Rash    Blistering rash  . Pulmicort [Budesonide] Itching    DISCHARGE MEDICATIONS:   Allergies as of 09/27/2017      Reactions   Contrast Media [iodinated Diagnostic Agents] Shortness Of Breath   Metrizamide Shortness Of Breath   Penicillins Hives, Other (See Comments)   Other reaction(s): Other (See Comments) Has patient had  a PCN reaction causing immediate rash, facial/tongue/throat swelling, SOB or lightheadedness with hypotension: Yes Has patient had a PCN reaction causing severe rash involving mucus membranes or skin necrosis: No Has patient had a PCN reaction that required hospitalization No Has patient had a PCN reaction occurring within the last 10 years: No If all of the above answers are "NO", then may  proceed with Cephalosporin use. Has patient had a PCN reaction causing immediate rash, facial/tongue/throat swelling, SOB or lightheadedness with hypotension: Yes Has patient had a PCN reaction causing severe rash involving mucus membranes or skin necrosis: No Has patient had a PCN reaction that required hospitalization No Has patient had a PCN reaction occurring within the last 10 years: No If all of the above answers are "NO", then may proceed with Cephalosporin use.   Isosorbide Nitrate    Other reaction(s): Headache   Latex Hives   Ondansetron Other (See Comments)   Severe headache   Povidone-iodine Rash   Blistering rash   Pulmicort [budesonide] Itching      Medication List    STOP taking these medications   losartan 25 MG tablet Commonly known as:  COZAAR   spironolactone 25 MG tablet Commonly known as:  ALDACTONE     TAKE these medications   beclomethasone 80 MCG/ACT inhaler Commonly known as:  QVAR Inhale 2 puffs into the lungs 2 (two) times daily.   chlorpheniramine-HYDROcodone 10-8 MG/5ML Suer Commonly known as:  TUSSIONEX Take 5 mLs by mouth every 12 (twelve) hours as needed for cough.   cyanocobalamin 500 MCG tablet Take 500 mcg by mouth daily.   furosemide 40 MG tablet Commonly known as:  LASIX Take 1 tablet (40 mg total) by mouth 2 (two) times daily. What changed:  how much to take   metoprolol succinate 50 MG 24 hr tablet Commonly known as:  TOPROL-XL Take 1 tablet (50 mg total) by mouth daily. Take with or immediately following a meal. What changed:    additional instructions  Another medication with the same name was removed. Continue taking this medication, and follow the directions you see here.   nitroGLYCERIN 0.4 MG SL tablet Commonly known as:  NITROSTAT Place 1 tablet (0.4 mg total) under the tongue every 5 (five) minutes as needed for chest pain.   oxyCODONE-acetaminophen 5-325 MG tablet Commonly known as:  PERCOCET/ROXICET Take 1  tablet by mouth every 6 (six) hours as needed for moderate pain.   potassium chloride SA 20 MEQ tablet Commonly known as:  K-DUR,KLOR-CON Take 1 tablet (20 mEq total) by mouth daily.   predniSONE 10 MG tablet Commonly known as:  DELTASONE 4 tabs po daily for three days   rOPINIRole 2 MG tablet Commonly known as:  REQUIP Take 1 tablet (2 mg total) by mouth at bedtime. 2-4  Mg per night What changed:    how much to take  additional instructions   vitamin C 500 MG tablet Commonly known as:  ASCORBIC ACID Take 500 mg by mouth daily.        DISCHARGE INSTRUCTIONS:   Follow-up PMD 1 week  If you experience worsening of your admission symptoms, develop shortness of breath, life threatening emergency, suicidal or homicidal thoughts you must seek medical attention immediately by calling 911 or calling your MD immediately  if symptoms less severe.  You Must read complete instructions/literature along with all the possible adverse reactions/side effects for all the Medicines you take and that have been prescribed to you. Take any new Medicines  after you have completely understood and accept all the possible adverse reactions/side effects.   Please note  You were cared for by a hospitalist during your hospital stay. If you have any questions about your discharge medications or the care you received while you were in the hospital after you are discharged, you can call the unit and asked to speak with the hospitalist on call if the hospitalist that took care of you is not available. Once you are discharged, your primary care physician will handle any further medical issues. Please note that NO REFILLS for any discharge medications will be authorized once you are discharged, as it is imperative that you return to your primary care physician (or establish a relationship with a primary care physician if you do not have one) for your aftercare needs so that they can reassess your need for  medications and monitor your lab values.    Today   CHIEF COMPLAINT:   Chief Complaint  Patient presents with  . Chest Pain    HISTORY OF PRESENT ILLNESS:  Inetha Maret  is a 52 y.o. female came in with chest pain and shortness of breath   VITAL SIGNS:  Blood pressure (!) 109/59, pulse 86, temperature 97.8 F (36.6 C), temperature source Oral, resp. rate 18, height 5\' 5"  (1.651 m), weight 103.6 kg (228 lb 8 oz), last menstrual period 01/22/2012, SpO2 97 %.    PHYSICAL EXAMINATION:  GENERAL:  52 y.o.-year-old patient lying in the bed with no acute distress.  EYES: Pupils equal, round, reactive to light and accommodation. No scleral icterus. Extraocular muscles intact.  HEENT: Head atraumatic, normocephalic. Oropharynx and nasopharynx clear.  NECK:  Supple, no jugular venous distention. No thyroid enlargement, no tenderness.  LUNGS:  decreased breath sounds bilaterally, no wheezing, rales,rhonchi or crepitation. No use of accessory muscles of respiration.  CARDIOVASCULAR: S1, S2 normal. No murmurs, rubs, or gallops.  ABDOMEN: Soft, non-tender, non-distended. Bowel sounds present. No organomegaly or mass.  EXTREMITIES: Trace edema, no cyanosis, or clubbing.  NEUROLOGIC: Cranial nerves II through XII are intact. Muscle strength 5/5 in all extremities. Sensation intact. Gait not checked.  PSYCHIATRIC: The patient is alert and oriented x 3.  SKIN: No obvious rash, lesion, or ulcer.   DATA REVIEW:   CBC Recent Labs  Lab 09/25/17 0454  WBC 13.7*  HGB 10.6*  HCT 32.4*  PLT 309    Chemistries  Recent Labs  Lab 09/24/17 0728 09/25/17 0454 09/27/17 0316  NA 139 140 138  K 2.9* 3.8 4.3  CL 100* 103 101  CO2 25 27 31   GLUCOSE 119* 146* 128*  BUN 15 16 20   CREATININE 0.98  1.18* 0.77 0.90  CALCIUM 9.2 9.4 8.9  MG  --  2.5*  --   AST 26  --   --   ALT 16  --   --   ALKPHOS 108  --   --   BILITOT 0.6  --   --     Cardiac Enzymes Recent Labs  Lab 09/24/17 0728   TROPONINI <0.03    Microbiology Results  Results for orders placed or performed during the hospital encounter of 09/24/17  Respiratory Panel by PCR     Status: Abnormal   Collection Time: 09/24/17  8:54 AM  Result Value Ref Range Status   Adenovirus NOT DETECTED NOT DETECTED Final   Coronavirus 229E DETECTED (A) NOT DETECTED Final   Coronavirus HKU1 NOT DETECTED NOT DETECTED Final   Coronavirus NL63 NOT  DETECTED NOT DETECTED Final   Coronavirus OC43 NOT DETECTED NOT DETECTED Final   Metapneumovirus NOT DETECTED NOT DETECTED Final   Rhinovirus / Enterovirus NOT DETECTED NOT DETECTED Final   Influenza A NOT DETECTED NOT DETECTED Final   Influenza B NOT DETECTED NOT DETECTED Final   Parainfluenza Virus 1 NOT DETECTED NOT DETECTED Final   Parainfluenza Virus 2 NOT DETECTED NOT DETECTED Final   Parainfluenza Virus 3 NOT DETECTED NOT DETECTED Final   Parainfluenza Virus 4 NOT DETECTED NOT DETECTED Final   Respiratory Syncytial Virus NOT DETECTED NOT DETECTED Final   Bordetella pertussis NOT DETECTED NOT DETECTED Final   Chlamydophila pneumoniae NOT DETECTED NOT DETECTED Final   Mycoplasma pneumoniae NOT DETECTED NOT DETECTED Final    Comment: Performed at [facility], [place] [address] [place], [place] [address]  MRSA PCR Screening     Status: None   Collection Time: 09/24/17 10:22 AM  Result Value Ref Range Status   MRSA by PCR NEGATIVE NEGATIVE Final    Comment:        The GeneXpert MRSA Assay (FDA approved for NASAL specimens only), is one component of a comprehensive MRSA colonization surveillance program. It is not intended to diagnose MRSA infection nor to guide or monitor treatment for MRSA infections. Performed at [facility], [address] [place],  [address]        Management plans discussed with the patient, family and they are in agreement.  CODE STATUS:  Code Status History    Date Active Date Inactive Code Status Order ID Comments  User Context   09/24/2017 08:48 09/27/2017 14:09 Full Code [id number]  Loletha Grayer, MD ED   07/03/2017 06:54 07/05/2017 12:48 Full Code [id number]  Saundra Shelling, MD ED   04/25/2017 01:46 04/26/2017 15:16 Full Code [id number]  Harvie Bridge, DO ED   01/28/2017 10:33 01/30/2017 17:41 Full Code [id number]  [name], MD Inpatient   01/22/2017 01:14 01/24/2017 17:59 Full Code [id number]  Lance Coon, MD Inpatient   12/09/2016 02:09 12/09/2016 15:07 Full Code [id number]  Lance Coon, MD Inpatient   11/21/2015 06:50 11/22/2015 15:50 Full Code [id number]  Saundra Shelling, MD Inpatient   09/19/2015 05:11 09/21/2015 18:29 Full Code [id number]  Harrie Foreman, MD Inpatient      TOTAL TIME TAKING CARE OF THIS PATIENT: 35 minutes.    Loletha Grayer M.D on 09/27/2017 at 3:40 PM  Between 7am to 6pm - Pager - [phone]  After 6pm go to [email or web address] - password [email or web address] [facility]  [phone]  CC: Primary care physician; [name], MD

## 2017-09-30 LAB — FACTOR 5 LEIDEN

## 2017-10-02 LAB — PROTHROMBIN GENE MUTATION

## 2017-10-24 ENCOUNTER — Ambulatory Visit: Payer: Medicaid Other | Attending: Neurology

## 2017-10-24 DIAGNOSIS — G4733 Obstructive sleep apnea (adult) (pediatric): Secondary | ICD-10-CM | POA: Insufficient documentation

## 2017-10-24 DIAGNOSIS — G4761 Periodic limb movement disorder: Secondary | ICD-10-CM | POA: Diagnosis present

## 2017-10-29 ENCOUNTER — Emergency Department: Payer: Medicaid Other

## 2017-10-29 ENCOUNTER — Encounter: Payer: Self-pay | Admitting: Emergency Medicine

## 2017-10-29 ENCOUNTER — Inpatient Hospital Stay
Admission: EM | Admit: 2017-10-29 | Discharge: 2017-11-01 | DRG: 194 | Disposition: A | Discharge status: Home / Self Care | Payer: Medicaid Other | Attending: Internal Medicine | Admitting: Internal Medicine

## 2017-10-29 DIAGNOSIS — Z888 Allergy status to other drugs, medicaments and biological substances status: Secondary | ICD-10-CM

## 2017-10-29 DIAGNOSIS — I5042 Chronic combined systolic (congestive) and diastolic (congestive) heart failure: Secondary | ICD-10-CM | POA: Diagnosis present

## 2017-10-29 DIAGNOSIS — Z8249 Family history of ischemic heart disease and other diseases of the circulatory system: Secondary | ICD-10-CM

## 2017-10-29 DIAGNOSIS — Z91040 Latex allergy status: Secondary | ICD-10-CM

## 2017-10-29 DIAGNOSIS — Z8614 Personal history of Methicillin resistant Staphylococcus aureus infection: Secondary | ICD-10-CM

## 2017-10-29 DIAGNOSIS — J441 Chronic obstructive pulmonary disease with (acute) exacerbation: Secondary | ICD-10-CM | POA: Diagnosis present

## 2017-10-29 DIAGNOSIS — E876 Hypokalemia: Secondary | ICD-10-CM | POA: Diagnosis present

## 2017-10-29 DIAGNOSIS — R0602 Shortness of breath: Secondary | ICD-10-CM | POA: Diagnosis present

## 2017-10-29 DIAGNOSIS — I5022 Chronic systolic (congestive) heart failure: Secondary | ICD-10-CM | POA: Diagnosis present

## 2017-10-29 DIAGNOSIS — Z7982 Long term (current) use of aspirin: Secondary | ICD-10-CM

## 2017-10-29 DIAGNOSIS — R0902 Hypoxemia: Secondary | ICD-10-CM | POA: Diagnosis present

## 2017-10-29 DIAGNOSIS — Z95810 Presence of automatic (implantable) cardiac defibrillator: Secondary | ICD-10-CM

## 2017-10-29 DIAGNOSIS — Z880 Allergy status to penicillin: Secondary | ICD-10-CM

## 2017-10-29 DIAGNOSIS — Z86718 Personal history of other venous thrombosis and embolism: Secondary | ICD-10-CM

## 2017-10-29 DIAGNOSIS — J449 Chronic obstructive pulmonary disease, unspecified: Secondary | ICD-10-CM | POA: Diagnosis present

## 2017-10-29 DIAGNOSIS — Z7951 Long term (current) use of inhaled steroids: Secondary | ICD-10-CM

## 2017-10-29 DIAGNOSIS — J101 Influenza due to other identified influenza virus with other respiratory manifestations: Principal | ICD-10-CM | POA: Diagnosis present

## 2017-10-29 DIAGNOSIS — A413 Sepsis due to Hemophilus influenzae: Secondary | ICD-10-CM

## 2017-10-29 DIAGNOSIS — G2581 Restless legs syndrome: Secondary | ICD-10-CM | POA: Diagnosis present

## 2017-10-29 LAB — PROTIME-INR
INR: 0.95
Prothrombin Time: 12.6 seconds (ref 11.4–15.2)

## 2017-10-29 LAB — LACTIC ACID, PLASMA: Lactic Acid, Venous: 1.9 mmol/L (ref 0.5–1.9)

## 2017-10-29 LAB — TROPONIN I: Troponin I: <0.03 ng/mL (ref <0.03)

## 2017-10-29 LAB — COMPREHENSIVE METABOLIC PANEL
ALK PHOS: 129 U/L — AB (ref 38–126)
ALT: 24 U/L (ref 14–54)
AST: 37 U/L (ref 15–41)
Albumin: 4.1 g/dL (ref 3.5–5.0)
Anion gap: 12 (ref 5–15)
BUN: 10 mg/dL (ref 6–20)
CALCIUM: 9 mg/dL (ref 8.9–10.3)
CO2: 28 mmol/L (ref 22–32)
CREATININE: 1 mg/dL (ref 0.44–1.00)
Chloride: 96 mmol/L — ABNORMAL LOW (ref 101–111)
Glucose, Bld: 111 mg/dL — ABNORMAL HIGH (ref 65–99)
Potassium: 3 mmol/L — ABNORMAL LOW (ref 3.5–5.1)
Sodium: 136 mmol/L (ref 135–145)
Total Bilirubin: 0.6 mg/dL (ref 0.3–1.2)
Total Protein: 8.3 g/dL — ABNORMAL HIGH (ref 6.5–8.1)

## 2017-10-29 LAB — URINALYSIS, ROUTINE W REFLEX MICROSCOPIC
BILIRUBIN URINE: NEGATIVE
Glucose, UA: NEGATIVE mg/dL
HGB URINE DIPSTICK: NEGATIVE
Ketones, ur: NEGATIVE mg/dL
Leukocytes, UA: NEGATIVE
NITRITE: NEGATIVE
Protein, ur: NEGATIVE mg/dL
SPECIFIC GRAVITY, URINE: 1.021 (ref 1.005–1.030)
pH: 5 (ref 5.0–8.0)

## 2017-10-29 LAB — CBC WITH DIFFERENTIAL/PLATELET
BASOS PCT: 1 %
Basophils Absolute: 0 10*3/uL (ref 0–0.1)
EOS ABS: 0.1 10*3/uL (ref 0–0.7)
EOS PCT: 1 %
HCT: 36.9 % (ref 35.0–47.0)
HEMOGLOBIN: 12.2 g/dL (ref 12.0–16.0)
Lymphocytes Relative: 10 %
Lymphs Abs: 0.8 10*3/uL — ABNORMAL LOW (ref 1.0–3.6)
MCH: 25.4 pg — ABNORMAL LOW (ref 26.0–34.0)
MCHC: 33 g/dL (ref 32.0–36.0)
MCV: 76.9 fL — ABNORMAL LOW (ref 80.0–100.0)
MONOS PCT: 9 %
Monocytes Absolute: 0.7 10*3/uL (ref 0.2–0.9)
NEUTROS PCT: 81 %
Neutro Abs: 6.8 10*3/uL — ABNORMAL HIGH (ref 1.4–6.5)
Platelets: 256 10*3/uL (ref 150–440)
RBC: 4.8 MIL/uL (ref 3.80–5.20)
RDW: 16.5 % — ABNORMAL HIGH (ref 11.5–14.5)
WBC: 8.5 10*3/uL (ref 3.6–11.0)

## 2017-10-29 LAB — LIPASE, BLOOD: Lipase: 29 U/L (ref 11–51)

## 2017-10-29 LAB — BRAIN NATRIURETIC PEPTIDE: B Natriuretic Peptide: 63 pg/mL (ref 0.0–100.0)

## 2017-10-29 LAB — INFLUENZA PANEL BY PCR (TYPE A & B)
INFLAPCR: POSITIVE — AB
INFLBPCR: NEGATIVE

## 2017-10-29 MED ORDER — OSELTAMIVIR PHOSPHATE 75 MG PO CAPS
75.0000 mg | ORAL_CAPSULE | Freq: Once | ORAL | Status: AC
  Administered 2017-10-29: 75 mg via ORAL
  Filled 2017-10-29: qty 1

## 2017-10-29 MED ORDER — HYDROCOD POLST-CPM POLST ER 10-8 MG/5ML PO SUER
5.0000 mL | Freq: Once | ORAL | Status: AC
Start: 2017-10-29 — End: 2017-10-29
  Administered 2017-10-29: 5 mL via ORAL
  Filled 2017-10-29: qty 5

## 2017-10-29 MED ORDER — PROMETHAZINE HCL 25 MG/ML IJ SOLN
12.5000 mg | Freq: Once | INTRAMUSCULAR | Status: AC
  Administered 2017-10-29: 12.5 mg via INTRAVENOUS
  Filled 2017-10-29: qty 1

## 2017-10-29 MED ORDER — METHYLPREDNISOLONE SODIUM SUCC 125 MG IJ SOLR
125.0000 mg | INTRAMUSCULAR | Status: AC
  Administered 2017-10-29: 125 mg via INTRAVENOUS
  Filled 2017-10-29: qty 2

## 2017-10-29 MED ORDER — SODIUM CHLORIDE 0.9 % IV BOLUS (SEPSIS)
500.0000 mL | Freq: Once | INTRAVENOUS | Status: AC
Start: 2017-10-29 — End: 2017-10-29
  Administered 2017-10-29: 500 mL via INTRAVENOUS

## 2017-10-29 MED ORDER — IBUPROFEN 800 MG PO TABS
800.0000 mg | ORAL_TABLET | ORAL | Status: AC
  Administered 2017-10-29: 800 mg via ORAL
  Filled 2017-10-29: qty 1

## 2017-10-29 MED ORDER — IPRATROPIUM-ALBUTEROL 0.5-2.5 (3) MG/3ML IN SOLN
3.0000 mL | Freq: Once | RESPIRATORY_TRACT | Status: AC
  Administered 2017-10-29: 3 mL via RESPIRATORY_TRACT
  Filled 2017-10-29: qty 3

## 2017-10-29 MED ORDER — SODIUM CHLORIDE 0.9 % IV BOLUS (SEPSIS)
500.0000 mL | Freq: Once | INTRAVENOUS | Status: AC
  Administered 2017-10-29: 500 mL via INTRAVENOUS

## 2017-10-29 NOTE — ED Provider Notes (Signed)
[facility] Emergency Department Provider Note   ____________________________________________   First MD Initiated Contact with Patient 10/29/17 2033     (approximate)  I have reviewed the triage vital signs and the nursing notes.   HISTORY  Chief Complaint Flu Like Symptoms    HPI Brittany Nguyen is a 51 y.o. female history of previous DVT, now off of anticoagulation, congestive heart failure, heart disease, ICD pacemaker.  Patient reports yesterday began experiencing some nausea, fever and body aches.  Today she has had a slight nonproductive cough but she will feeling of shortness of breath.  No wheezing, but does report a history of asthma.  She reports notable body aches.  No chest pain.  No leg swelling.  She has not been able to stay well-hydrated because of her nausea.  Not certain if she had a flu shot.  Denies sick contacts.  Reports fever at home to as high as "105" but took Tylenol just prior to coming in and also this morning.  She has an ICD, reports that last discharged in December, no shocks since her last hospital admission.    Past Medical History:  Diagnosis Date  . Asthma 2013  . CHF (congestive heart failure) ([place])   . Diverticulitis 2010  . DVT (deep venous thrombosis) ([place])   . Endometriosis 1990  . Heart disease 2013  . Hx MRSA infection   . Lump or mass in breast   . Restless leg     Patient Active Problem List   Diagnosis Date Noted  . Acute respiratory failure ([place]) 09/24/2017  . Sepsis ([place]) 07/03/2017  . COPD (chronic obstructive pulmonary disease) ([place]) 04/26/2017  . Community acquired pneumonia 04/25/2017  . Chronic systolic heart failure ([place]) 02/03/2017  . Hypotension 02/03/2017  . Chest pain 12/09/2016  . RLS (restless legs syndrome) 12/09/2016  . Cough, persistent 11/22/2015  . Hypokalemia 11/22/2015  . Leukocytosis 11/22/2015  . Dyspnea 11/21/2015  . Respiratory distress 09/19/2015  . Breast pain  01/11/2013  . Hx MRSA infection   . Lump or mass in breast   . GOITER, MULTINODULAR 01/20/2009  . HYPOGLYCEMIA, UNSPECIFIED 01/20/2009  . GERD 01/20/2009  . DIVERTICULITIS OF COLON 01/20/2009    Past Surgical History:  Procedure Laterality Date  . ABDOMINAL HYSTERECTOMY     age 17  . CARDIAC DEFIBRILLATOR PLACEMENT    . CESAREAN SECTION    . CHOLECYSTECTOMY    . COLECTOMY    . NASAL SINUS SURGERY  2012    Prior to Admission medications   Medication Sig Start Date End Date Taking? Authorizing Provider  aspirin EC 81 MG tablet Take 81 mg by mouth daily.   Yes [provider]  beclomethasone (QVAR) 80 MCG/ACT inhaler Inhale 2 puffs into the lungs 2 (two) times daily. 06/05/17 06/05/18 Yes Laverle Hobby, MD  cyanocobalamin 500 MCG tablet Take 500 mcg by mouth daily.   Yes [provider]  furosemide (LASIX) 40 MG tablet Take 1 tablet (40 mg total) by mouth 2 (two) times daily. 09/27/17  Yes Wieting, Richard, MD  losartan (COZAAR) 25 MG tablet Take 12.5 mg by mouth 2 (two) times daily.   Yes [provider]  metoprolol succinate (TOPROL-XL) 50 MG 24 hr tablet Take 1 tablet (50 mg total) by mouth daily. Take with or immediately following a meal. 09/27/17  Yes Wieting, Richard, MD  nitroGLYCERIN (NITROSTAT) 0.4 MG SL tablet Place 1 tablet (0.4 mg total) under the tongue every 5 (five)  minutes as needed for chest pain. 01/24/17  Yes Sainani, Belia Heman, MD  rOPINIRole (REQUIP) 2 MG tablet Take 1 tablet (2 mg total) by mouth at bedtime. 2-4  Mg per night Patient taking differently: Take 4 mg by mouth at bedtime.  04/26/17  Yes Bettey Costa, MD  spironolactone (ALDACTONE) 50 MG tablet Take 50 mg by mouth daily.   Yes [provider]  traMADol (ULTRAM) 50 MG tablet Take 50 mg by mouth every 6 (six) hours as needed for pain.   Yes [provider]  chlorpheniramine-HYDROcodone (TUSSIONEX) 10-8 MG/5ML SUER Take 5 mLs by mouth every 12 (twelve) hours as  needed for cough. Patient not taking: Reported on 10/29/2017 09/27/17   Loletha Grayer, MD  oxyCODONE-acetaminophen (PERCOCET/ROXICET) 5-325 MG tablet Take 1 tablet by mouth every 6 (six) hours as needed for moderate pain. Patient not taking: Reported on 10/29/2017 09/27/17   Loletha Grayer, MD  potassium chloride SA (K-DUR,KLOR-CON) 20 MEQ tablet Take 1 tablet (20 mEq total) by mouth daily. Patient not taking: Reported on 10/29/2017 09/27/17   Loletha Grayer, MD  predniSONE (DELTASONE) 10 MG tablet 4 tabs po daily for three days Patient not taking: Reported on 10/29/2017 09/27/17   Loletha Grayer, MD    Allergies Contrast media [iodinated diagnostic agents]; Metrizamide; Penicillins; Isosorbide nitrate; Latex; Ondansetron; Povidone-iodine; and Pulmicort [budesonide]  Family History  Problem Relation Age of Onset  . Cancer Mother 30       ovarian  . CAD Mother   . Cancer Father 42       brain  . CAD Father   . Cancer Daughter 18       skin  . Cancer Maternal Aunt 67       breast    Social History Social History   Tobacco Use  . Smoking status: Never Smoker  . Smokeless tobacco: Never Used  Substance Use Topics  . Alcohol use: No  . Drug use: No    Review of Systems Constitutional: Fevers and chills increased fatigue eyes: No visual changes. ENT: No sore throat. Cardiovascular: Denies chest pain. Respiratory: See HPI gastrointestinal: No abdominal pain.    No diarrhea.  No constipation. Genitourinary: Negative for dysuria. Musculoskeletal: Negative for back pain. Skin: Negative for rash. Neurological: Negative for headaches, focal weakness or numbness. Gynecologic: Denies pregnancy, previous hysterectomy   ____________________________________________   PHYSICAL EXAM:  VITAL SIGNS: ED Triage Vitals [10/29/17 2005]  Enc Vitals Group     BP 112/70     Pulse Rate (!) 121     Resp 18     Temp 100.1 F (37.8 C)     Temp Source Oral     SpO2 100 %     Weight  230 lb (104.3 kg)     Height 5\' 5"  (1.651 m)     Head Circumference      Peak Flow      Pain Score 9     Pain Loc      Pain Edu?      Excl. in [place]?     Constitutional: Alert and oriented.  Moderately ill-appearing, appears slightly diaphoretic.  Warm to the touch.  Appears ill overall, but in no acute extremities. Eyes: Conjunctivae are normal. Head: Atraumatic. Nose: No congestion/rhinnorhea. Mouth/Throat: Mucous membranes are moist. Neck: No stridor.   Cardiovascular: Slightly tachycardic rate, regular rhythm. Grossly normal heart sounds.  Good peripheral circulation. Respiratory: Frequent dry cough.  Mild end expiratory wheezing in the lower lobes bilateral.  Somewhat distant lung sounds, no clear rales or crackles detected.  Speaks in phrases.  No tripoding or obvious extremities though. Gastrointestinal: Soft and nontender. No distention. Musculoskeletal: No lower extremity tenderness nor edema. Neurologic:  Normal speech and language. No gross focal neurologic deficits are appreciated.  Skin:  Skin is warm, dry and intact. No rash noted. Psychiatric: Mood and affect are normal. Speech and behavior are normal.  ____________________________________________   LABS (all labs ordered are listed, but only abnormal results are displayed)  Labs Reviewed  COMPREHENSIVE METABOLIC PANEL - Abnormal; Notable for the following components:      Result Value   Potassium 3.0 (*)    Chloride 96 (*)    Glucose, Bld 111 (*)    Total Protein 8.3 (*)    Alkaline Phosphatase 129 (*)    All other components within normal limits  CBC WITH DIFFERENTIAL/PLATELET - Abnormal; Notable for the following components:   MCV 76.9 (*)    MCH 25.4 (*)    RDW 16.5 (*)    Neutro Abs 6.8 (*)    Lymphs Abs 0.8 (*)    All other components within normal limits  INFLUENZA PANEL BY PCR (TYPE A & B) - Abnormal; Notable for the following components:   Influenza A By PCR POSITIVE (*)    All other components  within normal limits  LACTIC ACID, PLASMA  PROTIME-INR  LIPASE, BLOOD  BRAIN NATRIURETIC PEPTIDE  TROPONIN I  LACTIC ACID, PLASMA  URINALYSIS, ROUTINE W REFLEX MICROSCOPIC   ____________________________________________  EKG  Reviewed enterotomy at 2050 Heart rate 130 QRS 90 QTC 500 Sinus tachycardia, no evidence of acute ischemic changes.  Left anterior fascicular block ____________________________________________  RADIOLOGY  Chest x-ray reviewed, negative for acute    ____________________________________________   PROCEDURES  Procedure(s) performed: None  Procedures  Critical Care performed: No  ____________________________________________   INITIAL IMPRESSION / ASSESSMENT AND PLAN / ED COURSE  Pertinent labs & imaging results that were available during my care of the patient were reviewed by me and considered in my medical decision making (see chart for details).  Patient presents for evaluation of upper respiratory symptoms, cough nausea and some occasional vomiting.  Notably febrile and tachycardic with dyspnea on exam.  She has multiple reasons that dyspnea could be present including congestive heart failure, asthma, infectious etiology which is what I most highly suspect.  Her symptoms seem likely consistent with a viral illness possibly influenza at this time a year.  She is awake and alert, reports some dyspnea lung sounds slight wheezing, will provide nebs, Solu-Medrol, check for influenza, chest x-ray etc.  ----------------------------------------- 10:57 PM on 10/29/2017 -----------------------------------------  Chest x-ray clear, but the patient test positive for influenza A which appears to explain her symptoms.  She is at 500 fluids which have been gently bolus and given her history of congestive heart failure.  She is awake alert reports her shortness of breath this improved, but still slight remains.  Lung sounds currently clear, speaking in phrases  without notable difficulty.  Given she is high risk with her history of congestive heart failure, COPD, persistent tachycardia despite gentle hydration I will give an additional 500 mL of fluid, start her on Tamiflu, and discussed with Dr. Jannifer Franklin admission to the hospital.  Patient also agreeable with admission, does report feeling somewhat improved, but felt to be high risk for worsening and decompensation given her previous history.      ____________________________________________   FINAL CLINICAL IMPRESSION(S) / ED DIAGNOSES  Final diagnoses:  Shortness of breath  Influenza A  Sepsis due to Haemophilus influenzae ([place])      NEW MEDICATIONS STARTED DURING THIS VISIT:  New Prescriptions   No medications on file     Note:  This document was prepared using Dragon voice recognition software and may include unintentional dictation errors.     Delman Kitten, MD 10/29/17 2258

## 2017-10-29 NOTE — H&P (Signed)
[facility] [place] NAME: Brittany Nguyen    MR#:  [id number]  DATE OF BIRTH:  [birth year]  DATE OF ADMISSION:  10/29/2017  PRIMARY CARE PHYSICIAN: [name], MD   REQUESTING/REFERRING PHYSICIAN: Jacqualine Code, MD  CHIEF COMPLAINT:   Chief Complaint  Patient presents with  . Flu Like Symptoms    HISTORY OF PRESENT ILLNESS:  Brittany Nguyen  is a 52 y.o. female who presents with fever, myalgias, cough.  Patient states that she began feeling bad about 48 hours ago developing cough, and within the last 24 hours developed fever and myalgias.  Here she was found to be influenza A positive.  She has a history of COPD and CHF.  She had wheezing on exam, but no edema on chest x-ray.  No pneumonia noted either.  She was initially somewhat hypoxic.  This improved after nebulizer treatment and with supplemental oxygen.  Given her significant comorbidities and tenuous oxygenation initially, hospitalist were called for admission.  PAST MEDICAL HISTORY:   Past Medical History:  Diagnosis Date  . Asthma 2013  . CHF (congestive heart failure) ([place])   . Diverticulitis 2010  . DVT (deep venous thrombosis) ([place])   . Endometriosis 1990  . Heart disease 2013  . Hx MRSA infection   . Lump or mass in breast   . Restless leg     PAST SURGICAL HISTORY:   Past Surgical History:  Procedure Laterality Date  . ABDOMINAL HYSTERECTOMY     age 49  . CARDIAC DEFIBRILLATOR PLACEMENT    . CESAREAN SECTION    . CHOLECYSTECTOMY    . COLECTOMY    . NASAL SINUS SURGERY  2012    SOCIAL HISTORY:   Social History   Tobacco Use  . Smoking status: Never Smoker  . Smokeless tobacco: Never Used  Substance Use Topics  . Alcohol use: No    FAMILY HISTORY:   Family History  Problem Relation Age of Onset  . Cancer Mother 30       ovarian  . CAD Mother   . Cancer Father 62       brain  . CAD Father   . Cancer Daughter 18       skin  . Cancer Maternal  Aunt 34       breast    DRUG ALLERGIES:   Allergies  Allergen Reactions  . Contrast Media [Iodinated Diagnostic Agents] Shortness Of Breath  . Metrizamide Shortness Of Breath  . Penicillins Hives and Other (See Comments)    Other reaction(s): Other (See Comments) Has patient had a PCN reaction causing immediate rash, facial/tongue/throat swelling, SOB or lightheadedness with hypotension: Yes Has patient had a PCN reaction causing severe rash involving mucus membranes or skin necrosis: No Has patient had a PCN reaction that required hospitalization No Has patient had a PCN reaction occurring within the last 10 years: No If all of the above answers are "NO", then may proceed with Cephalosporin use. Has patient had a PCN reaction causing immediate rash, facial/tongue/throat swelling, SOB or lightheadedness with hypotension: Yes Has patient had a PCN reaction causing severe rash involving mucus membranes or skin necrosis: No Has patient had a PCN reaction that required hospitalization No Has patient had a PCN reaction occurring within the last 10 years: No If all of the above answers are "NO", then may proceed with Cephalosporin use.   . Isosorbide Nitrate     Other reaction(s): Headache  . Latex  Hives  . Ondansetron Other (See Comments)    Severe headache  . Povidone-Iodine Rash    Blistering rash  . Pulmicort [Budesonide] Itching    MEDICATIONS AT HOME:   Prior to Admission medications   Medication Sig Start Date End Date Taking? Authorizing Provider  aspirin EC 81 MG tablet Take 81 mg by mouth daily.   Yes [provider]  beclomethasone (QVAR) 80 MCG/ACT inhaler Inhale 2 puffs into the lungs 2 (two) times daily. 06/05/17 06/05/18 Yes Laverle Hobby, MD  cyanocobalamin 500 MCG tablet Take 500 mcg by mouth daily.   Yes [provider]  furosemide (LASIX) 40 MG tablet Take 1 tablet (40 mg total) by mouth 2 (two) times daily. 09/27/17  Yes Wieting, Richard,  MD  losartan (COZAAR) 25 MG tablet Take 12.5 mg by mouth 2 (two) times daily.   Yes [provider]  metoprolol succinate (TOPROL-XL) 50 MG 24 hr tablet Take 1 tablet (50 mg total) by mouth daily. Take with or immediately following a meal. 09/27/17  Yes Wieting, Richard, MD  nitroGLYCERIN (NITROSTAT) 0.4 MG SL tablet Place 1 tablet (0.4 mg total) under the tongue every 5 (five) minutes as needed for chest pain. 01/24/17  Yes Sainani, Belia Heman, MD  rOPINIRole (REQUIP) 2 MG tablet Take 1 tablet (2 mg total) by mouth at bedtime. 2-4  Mg per night Patient taking differently: Take 4 mg by mouth at bedtime.  04/26/17  Yes Bettey Costa, MD  spironolactone (ALDACTONE) 50 MG tablet Take 50 mg by mouth daily.   Yes [provider]  traMADol (ULTRAM) 50 MG tablet Take 50 mg by mouth every 6 (six) hours as needed for pain.   Yes [provider]    REVIEW OF SYSTEMS:  Review of Systems  Constitutional: Positive for fever and malaise/fatigue. Negative for chills and weight loss.  HENT: Negative for ear pain, hearing loss and tinnitus.   Eyes: Negative for blurred vision, double vision, pain and redness.  Respiratory: Positive for cough, shortness of breath and wheezing. Negative for hemoptysis.   Cardiovascular: Negative for chest pain, palpitations, orthopnea and leg swelling.  Gastrointestinal: Negative for abdominal pain, constipation, diarrhea, nausea and vomiting.  Genitourinary: Negative for dysuria, frequency and hematuria.  Musculoskeletal: Positive for myalgias. Negative for back pain, joint pain and neck pain.  Skin:       No acne, rash, or lesions  Neurological: Negative for dizziness, tremors, focal weakness and weakness.  Endo/Heme/Allergies: Negative for polydipsia. Does not bruise/bleed easily.  Psychiatric/Behavioral: Negative for depression. The patient is not nervous/anxious and does not have insomnia.      VITAL SIGNS:   Vitals:   10/29/17 2200 10/29/17 2230  10/29/17 2254 10/29/17 2300  BP: 107/68 (!) 127/105  99/63  Pulse: (!) 125 (!) 122  (!) 118  Resp: 19 16  16   Temp:   (!) 100.5 F (38.1 C)   TempSrc:      SpO2: 97% 96%  95%  Weight:      Height:       Wt Readings from Last 3 Encounters:  10/29/17 104.3 kg (230 lb)  09/27/17 103.6 kg (228 lb 8 oz)  07/29/17 99.8 kg (220 lb)    PHYSICAL EXAMINATION:  Physical Exam  Vitals reviewed. Constitutional: She is oriented to person, place, and time. She appears well-developed and well-nourished. No distress.  HENT:  Head: Normocephalic and atraumatic.  Dry mucous membranes  Eyes: Conjunctivae and EOM are normal. Pupils are equal, round,  and reactive to light. No scleral icterus.  Neck: Normal range of motion. Neck supple. No JVD present. No thyromegaly present.  Cardiovascular: Regular rhythm and intact distal pulses. Exam reveals no gallop and no friction rub.  No murmur heard. Tachycardic  Respiratory: Effort normal. No respiratory distress. She has wheezes. She has no rales.  GI: Soft. Bowel sounds are normal. She exhibits no distension. There is no tenderness.  Musculoskeletal: Normal range of motion. She exhibits no edema.  No arthritis, no gout  Lymphadenopathy:    She has no cervical adenopathy.  Neurological: She is alert and oriented to person, place, and time. No cranial nerve deficit.  No dysarthria, no aphasia  Skin: Skin is warm and dry. No rash noted. No erythema.  Psychiatric: She has a normal mood and affect. Her behavior is normal. Judgment and thought content normal.    LABORATORY PANEL:   CBC Recent Labs  Lab 10/29/17 2103  WBC 8.5  HGB 12.2  HCT 36.9  PLT 256   ------------------------------------------------------------------------------------------------------------------  Chemistries  Recent Labs  Lab 10/29/17 2103  NA 136  K 3.0*  CL 96*  CO2 28  GLUCOSE 111*  BUN 10  CREATININE 1.00  CALCIUM 9.0  AST 37  ALT 24  ALKPHOS 129*   BILITOT 0.6   ------------------------------------------------------------------------------------------------------------------  Cardiac Enzymes Recent Labs  Lab 10/29/17 2103  TROPONINI <0.03   ------------------------------------------------------------------------------------------------------------------  RADIOLOGY:  Dg Chest Port 1 View  Result Date: 10/29/2017 CLINICAL DATA:  Shortness of breath, cough, and fever for 2 days. EXAM: PORTABLE CHEST 1 VIEW COMPARISON:  09/24/2017 FINDINGS: The heart size and mediastinal contours are within normal limits. Dual lead pacemaker remains in appropriate position. Both lungs are clear. The visualized skeletal structures are unremarkable. IMPRESSION: Stable exam.  No active disease. Electronically Signed   By: Earle Gell M.D.   On: 10/29/2017 21:22    EKG:   Orders placed or performed during the hospital encounter of 10/29/17  . ED EKG 12-Lead  . ED EKG 12-Lead    IMPRESSION AND PLAN:  Principal Problem:   Influenza A -Tamiflu started, other supportive treatment PRN Active Problems:   COPD with acute exacerbation (HCC) -IV Solu-Medrol, duo nebs and antitussive, continue home dose inhalers   Chronic systolic heart failure ([place]) -continue home meds   RLS (restless legs syndrome) - home dose Requip  All the records are reviewed and case discussed with ED provider. Management plans discussed with the patient and/or family.  DVT PROPHYLAXIS: SubQ lovenox  GI PROPHYLAXIS: None  ADMISSION STATUS: Observation  CODE STATUS: Full Code Status History    Date Active Date Inactive Code Status Order ID Comments User Context   09/24/2017 08:48 09/27/2017 14:09 Full Code [id number]  Loletha Grayer, MD ED   07/03/2017 06:54 07/05/2017 12:48 Full Code [id number]  Saundra Shelling, MD ED   04/25/2017 01:46 04/26/2017 15:16 Full Code [id number]  Harvie Bridge, DO ED   01/28/2017 10:33 01/30/2017 17:41 Full Code [id number]  [name], MD  Inpatient   01/22/2017 01:14 01/24/2017 17:59 Full Code [id number]  Lance Coon, MD Inpatient   12/09/2016 02:09 12/09/2016 15:07 Full Code [id number]  Lance Coon, MD Inpatient   11/21/2015 06:50 11/22/2015 15:50 Full Code [id number]  Saundra Shelling, MD Inpatient   09/19/2015 05:11 09/21/2015 18:29 Full Code [id number]  Harrie Foreman, MD Inpatient      TOTAL TIME TAKING CARE OF THIS PATIENT: 45 minutes.   Keen Ewalt Hermitage 10/29/2017, 11:53 PM  [employer]  Office  [phone]  CC: Primary care physician; [name], MD  Note:  This document was prepared using Dragon voice recognition software and may include unintentional dictation errors.

## 2017-10-29 NOTE — ED Triage Notes (Signed)
Patient states he symptoms started 2 days ago with cough, aches, and fever.  She has had temp of 105 about an hour ago and she took tylenol and its 100.1 as of triage.  She is speaking hoarsely and had 9/10 pain in her throat.

## 2017-10-30 ENCOUNTER — Other Ambulatory Visit: Payer: Self-pay

## 2017-10-30 ENCOUNTER — Encounter: Payer: Self-pay | Admitting: Emergency Medicine

## 2017-10-30 LAB — BASIC METABOLIC PANEL
Anion gap: 10 (ref 5–15)
BUN: 13 mg/dL (ref 6–20)
CHLORIDE: 98 mmol/L — AB (ref 101–111)
CO2: 26 mmol/L (ref 22–32)
CREATININE: 1.09 mg/dL — AB (ref 0.44–1.00)
Calcium: 8.9 mg/dL (ref 8.9–10.3)
GFR calc non Af Amer: 59 mL/min — ABNORMAL LOW (ref >60)
Glucose, Bld: 212 mg/dL — ABNORMAL HIGH (ref 65–99)
Potassium: 2.8 mmol/L — ABNORMAL LOW (ref 3.5–5.1)
Sodium: 134 mmol/L — ABNORMAL LOW (ref 135–145)

## 2017-10-30 LAB — CBC
HCT: 33.7 % — ABNORMAL LOW (ref 35.0–47.0)
Hemoglobin: 11.2 g/dL — ABNORMAL LOW (ref 12.0–16.0)
MCH: 25.7 pg — ABNORMAL LOW (ref 26.0–34.0)
MCHC: 33.1 g/dL (ref 32.0–36.0)
MCV: 77.5 fL — AB (ref 80.0–100.0)
PLATELETS: 237 10*3/uL (ref 150–440)
RBC: 4.35 MIL/uL (ref 3.80–5.20)
RDW: 16.8 % — ABNORMAL HIGH (ref 11.5–14.5)
WBC: 6.3 10*3/uL (ref 3.6–11.0)

## 2017-10-30 LAB — POTASSIUM: Potassium: 3.8 mmol/L (ref 3.5–5.1)

## 2017-10-30 LAB — MAGNESIUM: Magnesium: 2 mg/dL (ref 1.7–2.4)

## 2017-10-30 MED ORDER — ROPINIROLE HCL 1 MG PO TABS
4.0000 mg | ORAL_TABLET | Freq: Every day | ORAL | Status: DC
  Administered 2017-10-30 – 2017-10-31 (×3): 4 mg via ORAL
  Filled 2017-10-30 (×3): qty 4

## 2017-10-30 MED ORDER — ASPIRIN EC 81 MG PO TBEC
81.0000 mg | DELAYED_RELEASE_TABLET | Freq: Every day | ORAL | Status: DC
  Administered 2017-10-30 – 2017-11-01 (×3): 81 mg via ORAL
  Filled 2017-10-30 (×4): qty 1

## 2017-10-30 MED ORDER — FUROSEMIDE 40 MG PO TABS
40.0000 mg | ORAL_TABLET | Freq: Two times a day (BID) | ORAL | Status: DC
  Administered 2017-10-30 – 2017-11-01 (×5): 40 mg via ORAL
  Filled 2017-10-30 (×5): qty 1

## 2017-10-30 MED ORDER — GUAIFENESIN-DM 100-10 MG/5ML PO SYRP
5.0000 mL | ORAL_SOLUTION | ORAL | Status: DC | PRN
  Administered 2017-10-30: 5 mL via ORAL
  Filled 2017-10-30: qty 5

## 2017-10-30 MED ORDER — IPRATROPIUM-ALBUTEROL 0.5-2.5 (3) MG/3ML IN SOLN: 3.0000 mL | RESPIRATORY_TRACT | Status: DC | PRN

## 2017-10-30 MED ORDER — BECLOMETHASONE DIPROPIONATE 80 MCG/ACT IN AERS
2.0000 | INHALATION_SPRAY | Freq: Two times a day (BID) | RESPIRATORY_TRACT | Status: DC
  Administered 2017-10-30 – 2017-11-01 (×5): 2 via RESPIRATORY_TRACT

## 2017-10-30 MED ORDER — SPIRONOLACTONE 25 MG PO TABS
50.0000 mg | ORAL_TABLET | Freq: Every day | ORAL | Status: DC
  Administered 2017-10-30 – 2017-10-31 (×2): 50 mg via ORAL
  Filled 2017-10-30 (×2): qty 2

## 2017-10-30 MED ORDER — PROMETHAZINE HCL 25 MG/ML IJ SOLN
12.5000 mg | Freq: Four times a day (QID) | INTRAMUSCULAR | Status: DC | PRN
  Administered 2017-10-30 – 2017-11-01 (×8): 25 mg via INTRAVENOUS
  Filled 2017-10-30 (×8): qty 1

## 2017-10-30 MED ORDER — OSELTAMIVIR PHOSPHATE 75 MG PO CAPS
75.0000 mg | ORAL_CAPSULE | Freq: Two times a day (BID) | ORAL | Status: DC
  Administered 2017-10-30 – 2017-11-01 (×5): 75 mg via ORAL
  Filled 2017-10-30 (×6): qty 1

## 2017-10-30 MED ORDER — GUAIFENESIN-DM 100-10 MG/5ML PO SYRP
10.0000 mL | ORAL_SOLUTION | ORAL | Status: DC | PRN
  Administered 2017-10-30 – 2017-11-01 (×4): 10 mL via ORAL
  Filled 2017-10-30 (×4): qty 10

## 2017-10-30 MED ORDER — LOSARTAN POTASSIUM 25 MG PO TABS
12.5000 mg | ORAL_TABLET | Freq: Two times a day (BID) | ORAL | Status: DC
  Administered 2017-10-30 – 2017-11-01 (×3): 12.5 mg via ORAL
  Filled 2017-10-30 (×4): qty 1

## 2017-10-30 MED ORDER — ACETAMINOPHEN 325 MG PO TABS
650.0000 mg | ORAL_TABLET | Freq: Once | ORAL | Status: AC
  Administered 2017-10-30: 650 mg via ORAL
  Filled 2017-10-30: qty 2

## 2017-10-30 MED ORDER — ACETAMINOPHEN 650 MG RE SUPP: 650.0000 mg | Freq: Four times a day (QID) | RECTAL | Status: DC | PRN

## 2017-10-30 MED ORDER — METHYLPREDNISOLONE SODIUM SUCC 40 MG IJ SOLR
40.0000 mg | Freq: Three times a day (TID) | INTRAMUSCULAR | Status: DC
  Administered 2017-10-30 – 2017-10-31 (×4): 40 mg via INTRAVENOUS
  Filled 2017-10-30 (×4): qty 1

## 2017-10-30 MED ORDER — OXYCODONE HCL 5 MG PO TABS
5.0000 mg | ORAL_TABLET | Freq: Four times a day (QID) | ORAL | Status: DC | PRN
Start: 2017-10-30 — End: 2017-11-01
  Administered 2017-10-30 – 2017-11-01 (×7): 5 mg via ORAL
  Filled 2017-10-30 (×7): qty 1

## 2017-10-30 MED ORDER — SODIUM CHLORIDE 0.9 % IV SOLN
INTRAVENOUS | Status: AC
Start: 2017-10-30 — End: 2017-10-30
  Administered 2017-10-30: 02:00:00 via INTRAVENOUS

## 2017-10-30 MED ORDER — POTASSIUM CHLORIDE CRYS ER 20 MEQ PO TBCR
40.0000 meq | EXTENDED_RELEASE_TABLET | ORAL | Status: AC
  Administered 2017-10-30 (×2): 40 meq via ORAL
  Filled 2017-10-30 (×2): qty 2

## 2017-10-30 MED ORDER — ENOXAPARIN SODIUM 40 MG/0.4ML ~~LOC~~ SOLN
40.0000 mg | SUBCUTANEOUS | Status: DC
  Administered 2017-10-30 – 2017-10-31 (×2): 40 mg via SUBCUTANEOUS
  Filled 2017-10-30 (×2): qty 0.4

## 2017-10-30 MED ORDER — METOPROLOL SUCCINATE ER 50 MG PO TB24
50.0000 mg | ORAL_TABLET | Freq: Every day | ORAL | Status: DC
  Administered 2017-10-30 – 2017-11-01 (×2): 50 mg via ORAL
  Filled 2017-10-30 (×2): qty 1

## 2017-10-30 MED ORDER — ACETAMINOPHEN 325 MG PO TABS
650.0000 mg | ORAL_TABLET | Freq: Four times a day (QID) | ORAL | Status: DC | PRN
  Administered 2017-10-30 (×2): 650 mg via ORAL
  Filled 2017-10-30 (×2): qty 2

## 2017-10-30 MED ORDER — METHYLPREDNISOLONE SODIUM SUCC 125 MG IJ SOLR
60.0000 mg | Freq: Four times a day (QID) | INTRAMUSCULAR | Status: DC
  Administered 2017-10-30 (×2): 60 mg via INTRAVENOUS
  Filled 2017-10-30 (×2): qty 2

## 2017-10-30 NOTE — Consult Note (Signed)
PHARMACY CONSULT NOTE - FOLLOW UP  Pharmacy Consult for Electrolyte Monitoring and Replacement   Labs: Sodium (mmol/L)  Date Value  10/30/2017 134 (L)  10/03/2014 139   Potassium (mmol/L)  Date Value  10/30/2017 3.8  10/03/2014 3.9   Phosphorus (mg/dL)  Date Value  01/29/2017 4.3   Calcium (mg/dL)  Date Value  10/30/2017 8.9   Calcium, Total (mg/dL)  Date Value  10/03/2014 9.0   Albumin (g/dL)  Date Value  10/29/2017 4.1  12/14/2012 3.4  ] Estimated Creatinine Clearance: 74.1 mL/min (A) (by C-G formula based on SCr of 1.09 mg/dL (H)).   Assessment: Pharmacy consulted for electrolyte monitoring and replacement in 52 yo female admitted with influenza A.   Goal of Therapy:  Electrolytes WNL   Plan:  K = 3.8 is WNL after replacement. No additional supplementation needed at this time. Will recheck electrolytes with AM labs tomorrow.   Lenis Noon, PharmD, BCPS Clinical Pharmacist 10/30/2017 8:38 PM

## 2017-10-30 NOTE — ED Notes (Signed)
2A Rn will call this RN back when available for report.

## 2017-10-30 NOTE — Progress Notes (Signed)
[facility] [place] NAME: Brittany Nguyen    MR#:  [id number]  DATE OF BIRTH:  Aug 27, 1966  SUBJECTIVE:  CHIEF COMPLAINT: Patient is having generalized body aches and cough  REVIEW OF SYSTEMS:  CONSTITUTIONAL: No fever, fatigue or weakness.  Reports generalized body aches EYES: No blurred or double vision.  EARS, NOSE, AND THROAT: No tinnitus or ear pain.  RESPIRATORY: Reporting cough, denies shortness of breath, wheezing or hemoptysis.  CARDIOVASCULAR: No chest pain, orthopnea, edema.  GASTROINTESTINAL: No nausea, vomiting, diarrhea or abdominal pain.  GENITOURINARY: No dysuria, hematuria.  ENDOCRINE: No polyuria, nocturia,  HEMATOLOGY: No anemia, easy bruising or bleeding SKIN: No rash or lesion. MUSCULOSKELETAL: No joint pain or arthritis.   NEUROLOGIC: No tingling, numbness, weakness.  PSYCHIATRY: No anxiety or depression.   DRUG ALLERGIES:   Allergies  Allergen Reactions  . Contrast Media [Iodinated Diagnostic Agents] Shortness Of Breath  . Metrizamide Shortness Of Breath  . Penicillins Hives and Other (See Comments)    Other reaction(s): Other (See Comments) Has patient had a PCN reaction causing immediate rash, facial/tongue/throat swelling, SOB or lightheadedness with hypotension: Yes Has patient had a PCN reaction causing severe rash involving mucus membranes or skin necrosis: No Has patient had a PCN reaction that required hospitalization No Has patient had a PCN reaction occurring within the last 10 years: No If all of the above answers are "NO", then may proceed with Cephalosporin use. Has patient had a PCN reaction causing immediate rash, facial/tongue/throat swelling, SOB or lightheadedness with hypotension: Yes Has patient had a PCN reaction causing severe rash involving mucus membranes or skin necrosis: No Has patient had a PCN reaction that required hospitalization No Has patient had a PCN reaction occurring  within the last 10 years: No If all of the above answers are "NO", then may proceed with Cephalosporin use.   . Isosorbide Nitrate     Other reaction(s): Headache  . Latex Hives  . Ondansetron Other (See Comments)    Severe headache  . Povidone-Iodine Rash    Blistering rash  . Pulmicort [Budesonide] Itching    VITALS:  Blood pressure 97/61, pulse (!) 106, temperature 97.9 F (36.6 C), temperature source Oral, resp. rate 18, height 5\' 5"  (1.651 m), weight 102.6 kg (226 lb 3.2 oz), last menstrual period 01/22/2012, SpO2 97 %.  PHYSICAL EXAMINATION:  GENERAL:  52 y.o.-year-old patient lying in the bed with no acute distress.  EYES: Pupils equal, round, reactive to light and accommodation. No scleral icterus. Extraocular muscles intact.  HEENT: Head atraumatic, normocephalic. Oropharynx and nasopharynx   Congested NECK:  Supple, no jugular venous distention. No thyroid enlargement, no tenderness.  LUNGS: Bronchial breath sounds bilaterally, no wheezing, rales,rhonchi or crepitation. No use of accessory muscles of respiration.  CARDIOVASCULAR: S1, S2 normal. No murmurs, rubs, or gallops.  ABDOMEN: Soft, nontender, nondistended. Bowel sounds present.  EXTREMITIES: No pedal edema, cyanosis, or clubbing.  NEUROLOGIC: Cranial nerves II through XII are intact. Muscle strength 5/5 in all extremities. Sensation intact. Gait not checked.  PSYCHIATRIC: The patient is alert and oriented x 3.  SKIN: No obvious rash, lesion, or ulcer.    LABORATORY PANEL:   CBC Recent Labs  Lab 10/30/17 0438  WBC 6.3  HGB 11.2*  HCT 33.7*  PLT 237   ------------------------------------------------------------------------------------------------------------------  Chemistries  Recent Labs  Lab 10/29/17 2103 10/30/17 0438  NA 136 134*  K 3.0* 2.8*  CL 96* 98*  CO2 28 26  GLUCOSE 111* 212*  BUN 10 13  CREATININE 1.00 1.09*  CALCIUM 9.0 8.9  MG  --  2.0  AST 37  --   ALT 24  --   ALKPHOS 129*   --   BILITOT 0.6  --    ------------------------------------------------------------------------------------------------------------------  Cardiac Enzymes Recent Labs  Lab 10/29/17 2103  TROPONINI <0.03   ------------------------------------------------------------------------------------------------------------------  RADIOLOGY:  Dg Chest Port 1 View  Result Date: 10/29/2017 CLINICAL DATA:  Shortness of breath, cough, and fever for 2 days. EXAM: PORTABLE CHEST 1 VIEW COMPARISON:  09/24/2017 FINDINGS: The heart size and mediastinal contours are within normal limits. Dual lead pacemaker remains in appropriate position. Both lungs are clear. The visualized skeletal structures are unremarkable. IMPRESSION: Stable exam.  No active disease. Electronically Signed   By: Earle Gell M.D.   On: 10/29/2017 21:22    EKG:   Orders placed or performed during the hospital encounter of 10/29/17  . ED EKG 12-Lead  . ED EKG 12-Lead    ASSESSMENT AND PLAN:     Influenza A -Tamiflu other supportive treatment PRN    COPD with acute exacerbation (HCC) -IV Solu-Medrol, duo nebs and antitussive, continue  Inhalers  Hypokalemia-replete and recheck in a.m.    Chronic systolic heart failure (HCC) -clinically stable currently not fluid overloaded continue home meds aspirin, Lasix, Cozaar, Toprol    RLS (restless legs syndrome) - Requip    All the records are reviewed and case discussed with Care Management/Social Workerr. Management plans discussed with the patient, family and they are in agreement.  CODE STATUS: fc   TOTAL TIME TAKING CARE OF THIS PATIENT: 36 minutes.   POSSIBLE D/C IN 1-2 DAYS, DEPENDING ON CLINICAL CONDITION.  Note: This dictation was prepared with Dragon dictation along with smaller phrase technology. Any transcriptional errors that result from this process are unintentional.   Nicholes Mango M.D on 10/30/2017 at 3:03 PM  Between 7am to 6pm - Pager - [phone] After  6pm go to [email or web address] - password [email or web address]  [facility]  Office  [phone]  CC: Primary care physician; [name], MD

## 2017-10-30 NOTE — ED Notes (Signed)
2A RN called for report. This RN unable to give report at this time for privacy reasons.

## 2017-10-30 NOTE — ED Notes (Signed)
Pt awaiting transport at this time.

## 2017-10-30 NOTE — Consult Note (Signed)
PHARMACY CONSULT NOTE - FOLLOW UP  Pharmacy Consult for Electrolyte Monitoring and Replacement   Labs: Sodium (mmol/L)  Date Value  10/30/2017 134 (L)  10/03/2014 139   Potassium (mmol/L)  Date Value  10/30/2017 2.8 (L)  10/03/2014 3.9   Phosphorus (mg/dL)  Date Value  01/29/2017 4.3   Calcium (mg/dL)  Date Value  10/30/2017 8.9   Calcium, Total (mg/dL)  Date Value  10/03/2014 9.0   Albumin (g/dL)  Date Value  10/29/2017 4.1  12/14/2012 3.4  ] Estimated Creatinine Clearance: 74.1 mL/min (A) (by C-G formula based on SCr of 1.09 mg/dL (H)).   Assessment: Pharmacy consulted for electrolyte monitoring and replacement in 52 yo female admitted with influenza A.   Goal of Therapy:  Electrolytes WNL   Plan:  K+: 2.8. KCL 74mEq x 2 doses ordered. No additional supplementation needed.  Will recheck K+ at 1800 and order replacement as needed.  F/U all electrolytes with AM labs.   Pernell Dupre, PharmD, BCPS Clinical Pharmacist 10/30/2017 10:19 AM

## 2017-10-30 NOTE — Plan of Care (Signed)
  Education: Knowledge of General Education information will improve 10/30/2017 1215 - Progressing by Darrelyn Hillock, RN

## 2017-10-31 LAB — BASIC METABOLIC PANEL
Anion gap: 9 (ref 5–15)
BUN: 15 mg/dL (ref 6–20)
CALCIUM: 9 mg/dL (ref 8.9–10.3)
CO2: 27 mmol/L (ref 22–32)
Chloride: 104 mmol/L (ref 101–111)
Creatinine, Ser: 0.86 mg/dL (ref 0.44–1.00)
GFR calc Af Amer: >60 mL/min (ref >60)
GLUCOSE: 184 mg/dL — AB (ref 65–99)
Potassium: 3.9 mmol/L (ref 3.5–5.1)
Sodium: 140 mmol/L (ref 135–145)

## 2017-10-31 LAB — MAGNESIUM: Magnesium: 2.1 mg/dL (ref 1.7–2.4)

## 2017-10-31 MED ORDER — DIPHENHYDRAMINE HCL 25 MG PO CAPS
25.0000 mg | ORAL_CAPSULE | Freq: Four times a day (QID) | ORAL | Status: DC | PRN
  Filled 2017-10-31: qty 1

## 2017-10-31 MED ORDER — METOCLOPRAMIDE HCL 5 MG/ML IJ SOLN
5.0000 mg | Freq: Two times a day (BID) | INTRAMUSCULAR | Status: DC | PRN
  Administered 2017-10-31: 5 mg via INTRAVENOUS
  Filled 2017-10-31: qty 2

## 2017-10-31 MED ORDER — SPIRONOLACTONE 25 MG PO TABS
50.0000 mg | ORAL_TABLET | Freq: Every day | ORAL | Status: DC
  Administered 2017-11-01: 50 mg via ORAL
  Filled 2017-10-31: qty 2

## 2017-10-31 MED ORDER — METHYLPREDNISOLONE SODIUM SUCC 40 MG IJ SOLR
40.0000 mg | Freq: Two times a day (BID) | INTRAMUSCULAR | Status: DC
Start: 2017-11-01 — End: 2017-11-01

## 2017-10-31 MED ORDER — HYDROCOD POLST-CPM POLST ER 10-8 MG/5ML PO SUER
5.0000 mL | Freq: Two times a day (BID) | ORAL | Status: DC | PRN
  Administered 2017-10-31 – 2017-11-01 (×2): 5 mL via ORAL
  Filled 2017-10-31 (×2): qty 5

## 2017-10-31 NOTE — Plan of Care (Signed)
  Progressing Education: Knowledge of General Education information will improve 10/31/2017 1250 - Progressing by Rolley Sims, RN Health Behavior/Discharge Planning: Ability to manage health-related needs will improve 10/31/2017 1250 - Progressing by Rolley Sims, RN Clinical Measurements: Will remain free from infection 10/31/2017 1250 - Progressing by Rolley Sims, RN Cardiovascular complication will be avoided 10/31/2017 1250 - Progressing by Rolley Sims, RN

## 2017-10-31 NOTE — Progress Notes (Signed)
[facility] [place] NAME: Brittany Nguyen    MR#:  [id number]  DATE OF BIRTH:  22-Oct-1965  SUBJECTIVE:  CHIEF COMPLAINT: Patient with improved generalized body aches but still with bad   cough  REVIEW OF SYSTEMS:  CONSTITUTIONAL: No fever, fatigue or weakness.  Reports generalized body aches EYES: No blurred or double vision.  EARS, NOSE, AND THROAT: No tinnitus or ear pain.  RESPIRATORY: Reporting cough, denies shortness of breath, wheezing or hemoptysis.  CARDIOVASCULAR: No chest pain, orthopnea, edema.  GASTROINTESTINAL: No nausea, vomiting, diarrhea or abdominal pain.  GENITOURINARY: No dysuria, hematuria.  ENDOCRINE: No polyuria, nocturia,  HEMATOLOGY: No anemia, easy bruising or bleeding SKIN: No rash or lesion. MUSCULOSKELETAL: No joint pain or arthritis.   NEUROLOGIC: No tingling, numbness, weakness.  PSYCHIATRY: No anxiety or depression.   DRUG ALLERGIES:   Allergies  Allergen Reactions  . Contrast Media [Iodinated Diagnostic Agents] Shortness Of Breath  . Metrizamide Shortness Of Breath  . Penicillins Hives and Other (See Comments)    Other reaction(s): Other (See Comments) Has patient had a PCN reaction causing immediate rash, facial/tongue/throat swelling, SOB or lightheadedness with hypotension: Yes Has patient had a PCN reaction causing severe rash involving mucus membranes or skin necrosis: No Has patient had a PCN reaction that required hospitalization No Has patient had a PCN reaction occurring within the last 10 years: No If all of the above answers are "NO", then may proceed with Cephalosporin use. Has patient had a PCN reaction causing immediate rash, facial/tongue/throat swelling, SOB or lightheadedness with hypotension: Yes Has patient had a PCN reaction causing severe rash involving mucus membranes or skin necrosis: No Has patient had a PCN reaction that required hospitalization No Has patient had a PCN  reaction occurring within the last 10 years: No If all of the above answers are "NO", then may proceed with Cephalosporin use.   . Isosorbide Nitrate     Other reaction(s): Headache  . Latex Hives  . Ondansetron Other (See Comments)    Severe headache  . Povidone-Iodine Rash    Blistering rash  . Pulmicort [Budesonide] Itching    VITALS:  Blood pressure (!) 114/53, pulse 91, temperature 97.8 F (36.6 C), temperature source Oral, resp. rate 18, height 5\' 5"  (1.651 m), weight 103.9 kg (229 lb), last menstrual period 01/22/2012, SpO2 95 %.  PHYSICAL EXAMINATION:  GENERAL:  52 y.o.-year-old patient lying in the bed with no acute distress.  EYES: Pupils equal, round, reactive to light and accommodation. No scleral icterus. Extraocular muscles intact.  HEENT: Head atraumatic, normocephalic. Oropharynx and nasopharynx   Congested NECK:  Supple, no jugular venous distention. No thyroid enlargement, no tenderness.  LUNGS: Bronchial breath sounds bilaterally, no wheezing, rales,rhonchi or crepitation. No use of accessory muscles of respiration.  CARDIOVASCULAR: S1, S2 normal. No murmurs, rubs, or gallops.  ABDOMEN: Soft, nontender, nondistended. Bowel sounds present.  EXTREMITIES: No pedal edema, cyanosis, or clubbing.  NEUROLOGIC: Cranial nerves II through XII are intact. Muscle strength 5/5 in all extremities. Sensation intact. Gait not checked.  PSYCHIATRIC: The patient is alert and oriented x 3.  SKIN: No obvious rash, lesion, or ulcer.    LABORATORY PANEL:   CBC Recent Labs  Lab 10/30/17 0438  WBC 6.3  HGB 11.2*  HCT 33.7*  PLT 237   ------------------------------------------------------------------------------------------------------------------  Chemistries  Recent Labs  Lab 10/29/17 2103  10/31/17 0434  NA 136   < > 140  K 3.0*   < >  3.9  CL 96*   < > 104  CO2 28   < > 27  GLUCOSE 111*   < > 184*  BUN 10   < > 15  CREATININE 1.00   < > 0.86  CALCIUM 9.0   < > 9.0   MG  --    < > 2.1  AST 37  --   --   ALT 24  --   --   ALKPHOS 129*  --   --   BILITOT 0.6  --   --    < > = values in this interval not displayed.   ------------------------------------------------------------------------------------------------------------------  Cardiac Enzymes Recent Labs  Lab 10/29/17 2103  TROPONINI <0.03   ------------------------------------------------------------------------------------------------------------------  RADIOLOGY:  Dg Chest Port 1 View  Result Date: 10/29/2017 CLINICAL DATA:  Shortness of breath, cough, and fever for 2 days. EXAM: PORTABLE CHEST 1 VIEW COMPARISON:  09/24/2017 FINDINGS: The heart size and mediastinal contours are within normal limits. Dual lead pacemaker remains in appropriate position. Both lungs are clear. The visualized skeletal structures are unremarkable. IMPRESSION: Stable exam.  No active disease. Electronically Signed   By: Earle Gell M.D.   On: 10/29/2017 21:22    EKG:   Orders placed or performed during the hospital encounter of 10/29/17  . ED EKG 12-Lead  . ED EKG 12-Lead    ASSESSMENT AND PLAN:     Influenza A -Tamiflu other supportive treatment PRN    COPD with acute exacerbation (HCC) -IV Solu-Medrol, duo nebs and antitussive, continue  Inhalers  Hypokalemia-repleted and pot at 3.9     Chronic systolic heart failure (HCC) -clinically stable currently not fluid overloaded continue home meds aspirin, Lasix, Cozaar, Toprol    RLS (restless legs syndrome) - Requip    All the records are reviewed and case discussed with Care Management/Social Workerr. Management plans discussed with the patient, family and they are in agreement.  CODE STATUS: fc   TOTAL TIME TAKING CARE OF THIS PATIENT: 36 minutes.   POSSIBLE D/C IN 1-2 DAYS, DEPENDING ON CLINICAL CONDITION.  Note: This dictation was prepared with Dragon dictation along with smaller phrase technology. Any transcriptional errors that result from  this process are unintentional.   Nicholes Mango M.D on 10/31/2017 at 2:38 PM  Between 7am to 6pm - Pager - [phone] After 6pm go to [email or web address] - password [email or web address] [facility]  [facility]  Office  [phone]  CC: Primary care physician; [name], MD

## 2017-10-31 NOTE — Consult Note (Signed)
PHARMACY CONSULT NOTE - FOLLOW UP  Pharmacy Consult for Electrolyte Monitoring and Replacement   Labs: Sodium (mmol/L)  Date Value  10/31/2017 140  10/03/2014 139   Potassium (mmol/L)  Date Value  10/31/2017 3.9  10/03/2014 3.9   Phosphorus (mg/dL)  Date Value  01/29/2017 4.3   Calcium (mg/dL)  Date Value  10/31/2017 9.0   Calcium, Total (mg/dL)  Date Value  10/03/2014 9.0   Albumin (g/dL)  Date Value  10/29/2017 4.1  12/14/2012 3.4  ] Estimated Creatinine Clearance: 94.7 mL/min (by C-G formula based on SCr of 0.86 mg/dL).   Assessment: Pharmacy consulted for electrolyte monitoring and replacement in 52 yo female admitted with influenza A.   Goal of Therapy:  Electrolytes WNL   Plan:  Electrolytes WNL after replacement. No additional supplementation needed at this time.   Pernell Dupre, PharmD, BCPS Clinical Pharmacist 10/31/2017 11:31 AM

## 2017-10-31 NOTE — Progress Notes (Signed)
Pt still complaining of nausea after receiving Phenergan at 1153.Dr. Margaretmary Eddy made aware. Orders for Reglan 5mg . Will administer and continue to monitor.

## 2017-11-01 DIAGNOSIS — Z7951 Long term (current) use of inhaled steroids: Secondary | ICD-10-CM | POA: Diagnosis not present

## 2017-11-01 DIAGNOSIS — R0602 Shortness of breath: Secondary | ICD-10-CM | POA: Diagnosis present

## 2017-11-01 DIAGNOSIS — Z95810 Presence of automatic (implantable) cardiac defibrillator: Secondary | ICD-10-CM | POA: Diagnosis not present

## 2017-11-01 DIAGNOSIS — R0902 Hypoxemia: Secondary | ICD-10-CM | POA: Diagnosis present

## 2017-11-01 DIAGNOSIS — Z880 Allergy status to penicillin: Secondary | ICD-10-CM | POA: Diagnosis not present

## 2017-11-01 DIAGNOSIS — E876 Hypokalemia: Secondary | ICD-10-CM | POA: Diagnosis present

## 2017-11-01 DIAGNOSIS — Z888 Allergy status to other drugs, medicaments and biological substances status: Secondary | ICD-10-CM | POA: Diagnosis not present

## 2017-11-01 DIAGNOSIS — I5022 Chronic systolic (congestive) heart failure: Secondary | ICD-10-CM | POA: Diagnosis present

## 2017-11-01 DIAGNOSIS — Z8249 Family history of ischemic heart disease and other diseases of the circulatory system: Secondary | ICD-10-CM | POA: Diagnosis not present

## 2017-11-01 DIAGNOSIS — Z8614 Personal history of Methicillin resistant Staphylococcus aureus infection: Secondary | ICD-10-CM | POA: Diagnosis not present

## 2017-11-01 DIAGNOSIS — Z86718 Personal history of other venous thrombosis and embolism: Secondary | ICD-10-CM | POA: Diagnosis not present

## 2017-11-01 DIAGNOSIS — J441 Chronic obstructive pulmonary disease with (acute) exacerbation: Secondary | ICD-10-CM | POA: Diagnosis present

## 2017-11-01 DIAGNOSIS — J101 Influenza due to other identified influenza virus with other respiratory manifestations: Secondary | ICD-10-CM | POA: Diagnosis present

## 2017-11-01 DIAGNOSIS — Z7982 Long term (current) use of aspirin: Secondary | ICD-10-CM | POA: Diagnosis not present

## 2017-11-01 DIAGNOSIS — G2581 Restless legs syndrome: Secondary | ICD-10-CM | POA: Diagnosis present

## 2017-11-01 DIAGNOSIS — Z91040 Latex allergy status: Secondary | ICD-10-CM | POA: Diagnosis not present

## 2017-11-01 MED ORDER — PROMETHAZINE HCL 6.25 MG/5ML PO SYRP: 12.5000 mg | ORAL_SOLUTION | Freq: Four times a day (QID) | ORAL | 0 refills | Status: DC | PRN

## 2017-11-01 MED ORDER — TRAMADOL HCL 50 MG PO TABS: 50.0000 mg | ORAL_TABLET | Freq: Four times a day (QID) | ORAL | 0 refills | Status: DC | PRN

## 2017-11-01 MED ORDER — ACETAMINOPHEN 325 MG PO TABS: 650.0000 mg | ORAL_TABLET | Freq: Four times a day (QID) | ORAL | Status: DC | PRN

## 2017-11-01 MED ORDER — HYDROCOD POLST-CPM POLST ER 10-8 MG/5ML PO SUER: 5.0000 mL | Freq: Two times a day (BID) | ORAL | 0 refills | Status: DC | PRN

## 2017-11-01 MED ORDER — HYDROXYZINE HCL 25 MG PO TABS
25.0000 mg | ORAL_TABLET | Freq: Three times a day (TID) | ORAL | Status: DC | PRN
  Administered 2017-11-01 (×2): 25 mg via ORAL
  Filled 2017-11-01 (×2): qty 1

## 2017-11-01 MED ORDER — DIPHENHYDRAMINE HCL 25 MG PO CAPS
25.0000 mg | ORAL_CAPSULE | Freq: Four times a day (QID) | ORAL | Status: DC | PRN
  Administered 2017-11-01: 25 mg via ORAL
  Filled 2017-11-01: qty 1

## 2017-11-01 MED ORDER — OSELTAMIVIR PHOSPHATE 75 MG PO CAPS: 75.0000 mg | ORAL_CAPSULE | Freq: Two times a day (BID) | ORAL | 0 refills | Status: DC

## 2017-11-01 MED ORDER — PREDNISONE 10 MG (21) PO TBPK: 10.0000 mg | ORAL_TABLET | Freq: Every day | ORAL | 0 refills | Status: DC

## 2017-11-01 NOTE — Progress Notes (Signed)
Evon Slack Yamada to be D/C'd Home per MD order.  Discussed prescriptions and follow up appointments with the patient. Prescriptions given to patient, medication list explained in detail. Pt verbalized understanding.  Allergies as of 11/01/2017      Reactions   Contrast Media [iodinated Diagnostic Agents] Shortness Of Breath   Metrizamide Shortness Of Breath   Penicillins Hives, Other (See Comments)   Other reaction(s): Other (See Comments) Has patient had a PCN reaction causing immediate rash, facial/tongue/throat swelling, SOB or lightheadedness with hypotension: Yes Has patient had a PCN reaction causing severe rash involving mucus membranes or skin necrosis: No Has patient had a PCN reaction that required hospitalization No Has patient had a PCN reaction occurring within the last 10 years: No If all of the above answers are "NO", then may proceed with Cephalosporin use. Has patient had a PCN reaction causing immediate rash, facial/tongue/throat swelling, SOB or lightheadedness with hypotension: Yes Has patient had a PCN reaction causing severe rash involving mucus membranes or skin necrosis: No Has patient had a PCN reaction that required hospitalization No Has patient had a PCN reaction occurring within the last 10 years: No If all of the above answers are "NO", then may proceed with Cephalosporin use.   Isosorbide Nitrate    Other reaction(s): Headache   Latex Hives   Ondansetron Other (See Comments)   Severe headache   Povidone-iodine Rash   Blistering rash   Pulmicort [budesonide] Itching      Medication List    TAKE these medications   acetaminophen 325 MG tablet Commonly known as:  TYLENOL Take 2 tablets (650 mg total) by mouth every 6 (six) hours as needed for mild pain (or Fever >/= 101).   aspirin EC 81 MG tablet Take 81 mg by mouth daily.   beclomethasone 80 MCG/ACT inhaler Commonly known as:  QVAR Inhale 2 puffs into the lungs 2 (two) times daily.    chlorpheniramine-HYDROcodone 10-8 MG/5ML Suer Commonly known as:  TUSSIONEX Take 5 mLs by mouth every 12 (twelve) hours as needed for cough.   cyanocobalamin 500 MCG tablet Take 500 mcg by mouth daily.   furosemide 40 MG tablet Commonly known as:  LASIX Take 1 tablet (40 mg total) by mouth 2 (two) times daily.   losartan 25 MG tablet Commonly known as:  COZAAR Take 12.5 mg by mouth 2 (two) times daily.   metoprolol succinate 50 MG 24 hr tablet Commonly known as:  TOPROL-XL Take 1 tablet (50 mg total) by mouth daily. Take with or immediately following a meal.   nitroGLYCERIN 0.4 MG SL tablet Commonly known as:  NITROSTAT Place 1 tablet (0.4 mg total) under the tongue every 5 (five) minutes as needed for chest pain.   oseltamivir 75 MG capsule Commonly known as:  TAMIFLU Take 1 capsule (75 mg total) by mouth 2 (two) times daily.   predniSONE 10 MG (21) Tbpk tablet Commonly known as:  STERAPRED UNI-PAK 21 TAB Take 1 tablet (10 mg total) by mouth daily. Take 6 tablets by mouth for 1 day followed by  5 tablets by mouth for 1 day followed by  4 tablets by mouth for 1 day followed by  3 tablets by mouth for 1 day followed by  2 tablets by mouth for 1 day followed by  1 tablet by mouth for a day and stop   promethazine 6.25 MG/5ML syrup Commonly known as:  PHENERGAN Take 10 mLs (12.5 mg total) by mouth every 6 (six) hours  as needed for nausea or vomiting.   rOPINIRole 2 MG tablet Commonly known as:  REQUIP Take 1 tablet (2 mg total) by mouth at bedtime. 2-4  Mg per night What changed:    how much to take  additional instructions   spironolactone 50 MG tablet Commonly known as:  ALDACTONE Take 50 mg by mouth daily.   traMADol 50 MG tablet Commonly known as:  ULTRAM Take 1 tablet (50 mg total) by mouth every 6 (six) hours as needed. What changed:  reasons to take this       Vitals:   10/31/17 2104 11/01/17 0821  BP: 107/67 116/68  Pulse: (!) 106 92  Resp: 18  18  Temp: 98.1 F (36.7 C) 98 F (36.7 C)  SpO2: 95% 95%    Tele box removed and returned. Skin clean, dry and intact without evidence of skin break down, no evidence of skin tears noted. IV catheter discontinued intact. Site without signs and symptoms of complications. Dressing and pressure applied. Pt denies pain at this time. No complaints noted.  An After Visit Summary was printed and given to the patient. Patient escorted via [place], and D/C home via private auto.  Rolley Sims

## 2017-11-01 NOTE — Discharge Summary (Signed)
[facility] [place] NAME: Brittany Nguyen    MR#:  [id number]  DATE OF BIRTH:  [birth year]  DATE OF ADMISSION:  10/29/2017 ADMITTING PHYSICIAN: Lance Coon, MD  DATE OF DISCHARGE: 11/01/17  PRIMARY CARE PHYSICIAN: [name], MD    ADMISSION DIAGNOSIS:  Shortness of breath [R06.02] Influenza A [J10.1] Sepsis due to Haemophilus influenzae ([place]) [A41.3]  DISCHARGE DIAGNOSIS:  Principal Problem:   Influenza A Active Problems:   RLS (restless legs syndrome)   Chronic systolic heart failure (HCC)   COPD with acute exacerbation (HCC)   SOB (shortness of breath)   SECONDARY DIAGNOSIS:   Past Medical History:  Diagnosis Date  . Asthma 2013  . CHF (congestive heart failure) ([place])   . Diverticulitis 2010  . DVT (deep venous thrombosis) ([place])   . Endometriosis 1990  . Heart disease 2013  . Hx MRSA infection   . Lump or mass in breast   . Restless leg     HOSPITAL COURSE:  hpi  Brittany Nguyen  is a 52 y.o. female who presents with fever, myalgias, cough.  Patient states that she began feeling bad about 48 hours ago developing cough, and within the last 24 hours developed fever and myalgias.  Here she was found to be influenza A positive.  She has a history of COPD and CHF.  She had wheezing on exam, but no edema on chest x-ray.  No pneumonia noted either.  She was initially somewhat hypoxic.  This improved after nebulizer treatment and with supplemental oxygen.  Given her significant comorbidities and tenuous oxygenation initially, hospitalist were called for admission.   Influenza A -Tamiflu other supportive treatment PRN Pt is feeling much bettet  COPD with acute exacerbation (HCC) -IV Solu-Medrol taper to p.o. prednisone, duo nebs and antitussive, continue  Inhalers  Hypokalemia-repleted and pot at 3.9   Chronic systolic heart failure (HCC) -clinically stable currently not fluid overloaded continue home meds aspirin,  Lasix, Cozaar, Toprol  RLS (restless legs syndrome) -Requip  Discharge patient home  DISCHARGE CONDITIONS:   Stable  CONSULTS OBTAINED:   none  PROCEDURES none  DRUG ALLERGIES:   Allergies  Allergen Reactions  . Contrast Media [Iodinated Diagnostic Agents] Shortness Of Breath  . Metrizamide Shortness Of Breath  . Penicillins Hives and Other (See Comments)    Other reaction(s): Other (See Comments) Has patient had a PCN reaction causing immediate rash, facial/tongue/throat swelling, SOB or lightheadedness with hypotension: Yes Has patient had a PCN reaction causing severe rash involving mucus membranes or skin necrosis: No Has patient had a PCN reaction that required hospitalization No Has patient had a PCN reaction occurring within the last 10 years: No If all of the above answers are "NO", then may proceed with Cephalosporin use. Has patient had a PCN reaction causing immediate rash, facial/tongue/throat swelling, SOB or lightheadedness with hypotension: Yes Has patient had a PCN reaction causing severe rash involving mucus membranes or skin necrosis: No Has patient had a PCN reaction that required hospitalization No Has patient had a PCN reaction occurring within the last 10 years: No If all of the above answers are "NO", then may proceed with Cephalosporin use.   . Isosorbide Nitrate     Other reaction(s): Headache  . Latex Hives  . Ondansetron Other (See Comments)    Severe headache  . Povidone-Iodine Rash    Blistering rash  . Pulmicort [Budesonide] Itching    DISCHARGE MEDICATIONS:   Allergies as  of 11/01/2017      Reactions   Contrast Media [iodinated Diagnostic Agents] Shortness Of Breath   Metrizamide Shortness Of Breath   Penicillins Hives, Other (See Comments)   Other reaction(s): Other (See Comments) Has patient had a PCN reaction causing immediate rash, facial/tongue/throat swelling, SOB or lightheadedness with hypotension: Yes Has patient had a  PCN reaction causing severe rash involving mucus membranes or skin necrosis: No Has patient had a PCN reaction that required hospitalization No Has patient had a PCN reaction occurring within the last 10 years: No If all of the above answers are "NO", then may proceed with Cephalosporin use. Has patient had a PCN reaction causing immediate rash, facial/tongue/throat swelling, SOB or lightheadedness with hypotension: Yes Has patient had a PCN reaction causing severe rash involving mucus membranes or skin necrosis: No Has patient had a PCN reaction that required hospitalization No Has patient had a PCN reaction occurring within the last 10 years: No If all of the above answers are "NO", then may proceed with Cephalosporin use.   Isosorbide Nitrate    Other reaction(s): Headache   Latex Hives   Ondansetron Other (See Comments)   Severe headache   Povidone-iodine Rash   Blistering rash   Pulmicort [budesonide] Itching      Medication List    TAKE these medications   acetaminophen 325 MG tablet Commonly known as:  TYLENOL Take 2 tablets (650 mg total) by mouth every 6 (six) hours as needed for mild pain (or Fever >/= 101).   aspirin EC 81 MG tablet Take 81 mg by mouth daily.   beclomethasone 80 MCG/ACT inhaler Commonly known as:  QVAR Inhale 2 puffs into the lungs 2 (two) times daily.   chlorpheniramine-HYDROcodone 10-8 MG/5ML Suer Commonly known as:  TUSSIONEX Take 5 mLs by mouth every 12 (twelve) hours as needed for cough.   cyanocobalamin 500 MCG tablet Take 500 mcg by mouth daily.   furosemide 40 MG tablet Commonly known as:  LASIX Take 1 tablet (40 mg total) by mouth 2 (two) times daily.   losartan 25 MG tablet Commonly known as:  COZAAR Take 12.5 mg by mouth 2 (two) times daily.   metoprolol succinate 50 MG 24 hr tablet Commonly known as:  TOPROL-XL Take 1 tablet (50 mg total) by mouth daily. Take with or immediately following a meal.   nitroGLYCERIN 0.4 MG SL  tablet Commonly known as:  NITROSTAT Place 1 tablet (0.4 mg total) under the tongue every 5 (five) minutes as needed for chest pain.   oseltamivir 75 MG capsule Commonly known as:  TAMIFLU Take 1 capsule (75 mg total) by mouth 2 (two) times daily.   predniSONE 10 MG (21) Tbpk tablet Commonly known as:  STERAPRED UNI-PAK 21 TAB Take 1 tablet (10 mg total) by mouth daily. Take 6 tablets by mouth for 1 day followed by  5 tablets by mouth for 1 day followed by  4 tablets by mouth for 1 day followed by  3 tablets by mouth for 1 day followed by  2 tablets by mouth for 1 day followed by  1 tablet by mouth for a day and stop   promethazine 6.25 MG/5ML syrup Commonly known as:  PHENERGAN Take 10 mLs (12.5 mg total) by mouth every 6 (six) hours as needed for nausea or vomiting.   rOPINIRole 2 MG tablet Commonly known as:  REQUIP Take 1 tablet (2 mg total) by mouth at bedtime. 2-4  Mg per night What changed:  how much to take  additional instructions   spironolactone 50 MG tablet Commonly known as:  ALDACTONE Take 50 mg by mouth daily.   traMADol 50 MG tablet Commonly known as:  ULTRAM Take 1 tablet (50 mg total) by mouth every 6 (six) hours as needed. What changed:  reasons to take this        DISCHARGE INSTRUCTIONS:   Follow-up with primary care physician in a week  DIET:  Cardiac diet  DISCHARGE CONDITION:  Stable  ACTIVITY:  Activity as tolerated  OXYGEN:  Home Oxygen: No.   Oxygen Delivery: room air  DISCHARGE LOCATION:  home   If you experience worsening of your admission symptoms, develop shortness of breath, life threatening emergency, suicidal or homicidal thoughts you must seek medical attention immediately by calling 911 or calling your MD immediately  if symptoms less severe.  You Must read complete instructions/literature along with all the possible adverse reactions/side effects for all the Medicines you take and that have been prescribed to you.  Take any new Medicines after you have completely understood and accpet all the possible adverse reactions/side effects.   Please note  You were cared for by a hospitalist during your hospital stay. If you have any questions about your discharge medications or the care you received while you were in the hospital after you are discharged, you can call the unit and asked to speak with the hospitalist on call if the hospitalist that took care of you is not available. Once you are discharged, your primary care physician will handle any further medical issues. Please note that NO REFILLS for any discharge medications will be authorized once you are discharged, as it is imperative that you return to your primary care physician (or establish a relationship with a primary care physician if you do not have one) for your aftercare needs so that they can reassess your need for medications and monitor your lab values.     Today  Chief Complaint  Patient presents with  . Flu Like Symptoms   Patient is feeling much better wants to go home  ROS:  CONSTITUTIONAL: Denies fevers, chills. Denies any fatigue, weakness.  EYES: Denies blurry vision, double vision, eye pain. EARS, NOSE, THROAT: Denies tinnitus, ear pain, hearing loss. RESPIRATORY: Denies cough, wheeze, shortness of breath.  CARDIOVASCULAR: Denies chest pain, palpitations, edema.  GASTROINTESTINAL: Denies nausea, vomiting, diarrhea, abdominal pain. Denies bright red blood per rectum. GENITOURINARY: Denies dysuria, hematuria. ENDOCRINE: Denies nocturia or thyroid problems. HEMATOLOGIC AND LYMPHATIC: Denies easy bruising or bleeding. SKIN: Denies rash or lesion. MUSCULOSKELETAL: Denies pain in neck, back, shoulder, knees, hips or arthritic symptoms.  NEUROLOGIC: Denies paralysis, paresthesias.  PSYCHIATRIC: Denies anxiety or depressive symptoms.   VITAL SIGNS:  Blood pressure 116/68, pulse 92, temperature 98 F (36.7 C), temperature source  Oral, resp. rate 18, height 5\' 5"  (1.651 m), weight 104.9 kg (231 lb 3.2 oz), last menstrual period 01/22/2012, SpO2 95 %.  I/O:    Intake/Output Summary (Last 24 hours) at 11/01/2017 1012 Last data filed at 10/31/2017 1719 Gross per 24 hour  Intake 360 ml  Output 400 ml  Net -40 ml    PHYSICAL EXAMINATION:  GENERAL:  52 y.o.-year-old patient lying in the bed with no acute distress.  EYES: Pupils equal, round, reactive to light and accommodation. No scleral icterus. Extraocular muscles intact.  HEENT: Head atraumatic, normocephalic. Oropharynx and nasopharynx clear.  NECK:  Supple, no jugular venous distention. No thyroid enlargement, no tenderness.  LUNGS:  Normal breath sounds bilaterally, no wheezing, rales,rhonchi or crepitation. No use of accessory muscles of respiration.  CARDIOVASCULAR: S1, S2 normal. No murmurs, rubs, or gallops.  ABDOMEN: Soft, non-tender, non-distended. Bowel sounds present. No organomegaly or mass.  EXTREMITIES: No pedal edema, cyanosis, or clubbing.  NEUROLOGIC: Cranial nerves II through XII are intact. Muscle strength 5/5 in all extremities. Sensation intact. Gait not checked.  PSYCHIATRIC: The patient is alert and oriented x 3.  SKIN: No obvious rash, lesion, or ulcer.   DATA REVIEW:   CBC Recent Labs  Lab 10/30/17 0438  WBC 6.3  HGB 11.2*  HCT 33.7*  PLT 237    Chemistries  Recent Labs  Lab 10/29/17 2103  10/31/17 0434  NA 136   < > 140  K 3.0*   < > 3.9  CL 96*   < > 104  CO2 28   < > 27  GLUCOSE 111*   < > 184*  BUN 10   < > 15  CREATININE 1.00   < > 0.86  CALCIUM 9.0   < > 9.0  MG  --    < > 2.1  AST 37  --   --   ALT 24  --   --   ALKPHOS 129*  --   --   BILITOT 0.6  --   --    < > = values in this interval not displayed.    Cardiac Enzymes Recent Labs  Lab 10/29/17 2103  TROPONINI <0.03    Microbiology Results  Results for orders placed or performed during the hospital encounter of 09/24/17  Respiratory Panel by PCR      Status: Abnormal   Collection Time: 09/24/17  8:54 AM  Result Value Ref Range Status   Adenovirus NOT DETECTED NOT DETECTED Final   Coronavirus 229E DETECTED (A) NOT DETECTED Final   Coronavirus HKU1 NOT DETECTED NOT DETECTED Final   Coronavirus NL63 NOT DETECTED NOT DETECTED Final   Coronavirus OC43 NOT DETECTED NOT DETECTED Final   Metapneumovirus NOT DETECTED NOT DETECTED Final   Rhinovirus / Enterovirus NOT DETECTED NOT DETECTED Final   Influenza A NOT DETECTED NOT DETECTED Final   Influenza B NOT DETECTED NOT DETECTED Final   Parainfluenza Virus 1 NOT DETECTED NOT DETECTED Final   Parainfluenza Virus 2 NOT DETECTED NOT DETECTED Final   Parainfluenza Virus 3 NOT DETECTED NOT DETECTED Final   Parainfluenza Virus 4 NOT DETECTED NOT DETECTED Final   Respiratory Syncytial Virus NOT DETECTED NOT DETECTED Final   Bordetella pertussis NOT DETECTED NOT DETECTED Final   Chlamydophila pneumoniae NOT DETECTED NOT DETECTED Final   Mycoplasma pneumoniae NOT DETECTED NOT DETECTED Final    Comment: Performed at [facility], [place]. [address] [place], [place] [address]  MRSA PCR Screening     Status: None   Collection Time: 09/24/17 10:22 AM  Result Value Ref Range Status   MRSA by PCR NEGATIVE NEGATIVE Final    Comment:        The GeneXpert MRSA Assay (FDA approved for NASAL specimens only), is one component of a comprehensive MRSA colonization surveillance program. It is not intended to diagnose MRSA infection nor to guide or monitor treatment for MRSA infections. Performed at [facility], [address] [place],  [address]     RADIOLOGY:  Dg Chest Port 1 View  Result Date: 10/29/2017 CLINICAL DATA:  Shortness of breath, cough, and fever for 2 days. EXAM: PORTABLE CHEST 1 VIEW COMPARISON:  09/24/2017 FINDINGS: The heart size and mediastinal contours are within normal limits. Dual lead pacemaker remains in appropriate position. Both lungs are clear. The  visualized skeletal structures are unremarkable. IMPRESSION: Stable exam.  No active disease. Electronically Signed   By: Earle Gell M.D.   On: 10/29/2017 21:22    EKG:   Orders placed or performed during the hospital encounter of 10/29/17  . ED EKG 12-Lead  . ED EKG 12-Lead      Management plans discussed with the patient, family and they are in agreement.  CODE STATUS:     Code Status Orders  (From admission, onward)        Start     Ordered   10/30/17 0114  Full code  Continuous     10/30/17 0113    Code Status History    Date Active Date Inactive Code Status Order ID Comments User Context   09/24/2017 08:48 09/27/2017 14:09 Full Code [id number]  Loletha Grayer, MD ED   07/03/2017 06:54 07/05/2017 12:48 Full Code [id number]  Saundra Shelling, MD ED   04/25/2017 01:46 04/26/2017 15:16 Full Code [id number]  Harvie Bridge, DO ED   01/28/2017 10:33 01/30/2017 17:41 Full Code [id number]  [name], MD Inpatient   01/22/2017 01:14 01/24/2017 17:59 Full Code [id number]  Lance Coon, MD Inpatient   12/09/2016 02:09 12/09/2016 15:07 Full Code [id number]  Lance Coon, MD Inpatient   11/21/2015 06:50 11/22/2015 15:50 Full Code [id number]  Saundra Shelling, MD Inpatient   09/19/2015 05:11 09/21/2015 18:29 Full Code [id number]  Harrie Foreman, MD Inpatient      TOTAL TIME TAKING CARE OF THIS PATIENT: 43  minutes.   Note: This dictation was prepared with Dragon dictation along with smaller phrase technology. Any transcriptional errors that result from this process are unintentional.   @[facility] @  on 11/01/2017 at 10:12 AM  Between 7am to 6pm - Pager - [phone]  After 6pm go to [email or web address] - password [email or web address] [facility]  [facility]  Office  [phone]  CC: Primary care physician; [name], MD

## 2017-11-01 NOTE — Discharge Instructions (Signed)
Follow-up with primary care physician in a week ° °

## 2017-11-06 ENCOUNTER — Telehealth: Payer: Self-pay

## 2017-11-06 NOTE — Telephone Encounter (Signed)
EMMI Follow-up: Report noted patient had other questions post discharge so left a message for her to call me at her convenience to discuss.

## 2017-11-06 NOTE — Telephone Encounter (Signed)
EMMI Follow-up: Brittany Nguyen returned my call.  She said her concern was she was coughing up greenish-brown flem and she had already called and left a message with her PCP for some medication. I reminded her of her f/u appt. with Dr. Kary Kos on 3/7 and she thanked me.

## 2017-11-09 IMAGING — CR DG CHEST 2V
2 series · 2 of 2 positions shown · non-contrast
Comparison: 06/06/2016

CLINICAL DATA: Cough and fever for several days.

EXAM:
CHEST  2 VIEW

[chest pa]
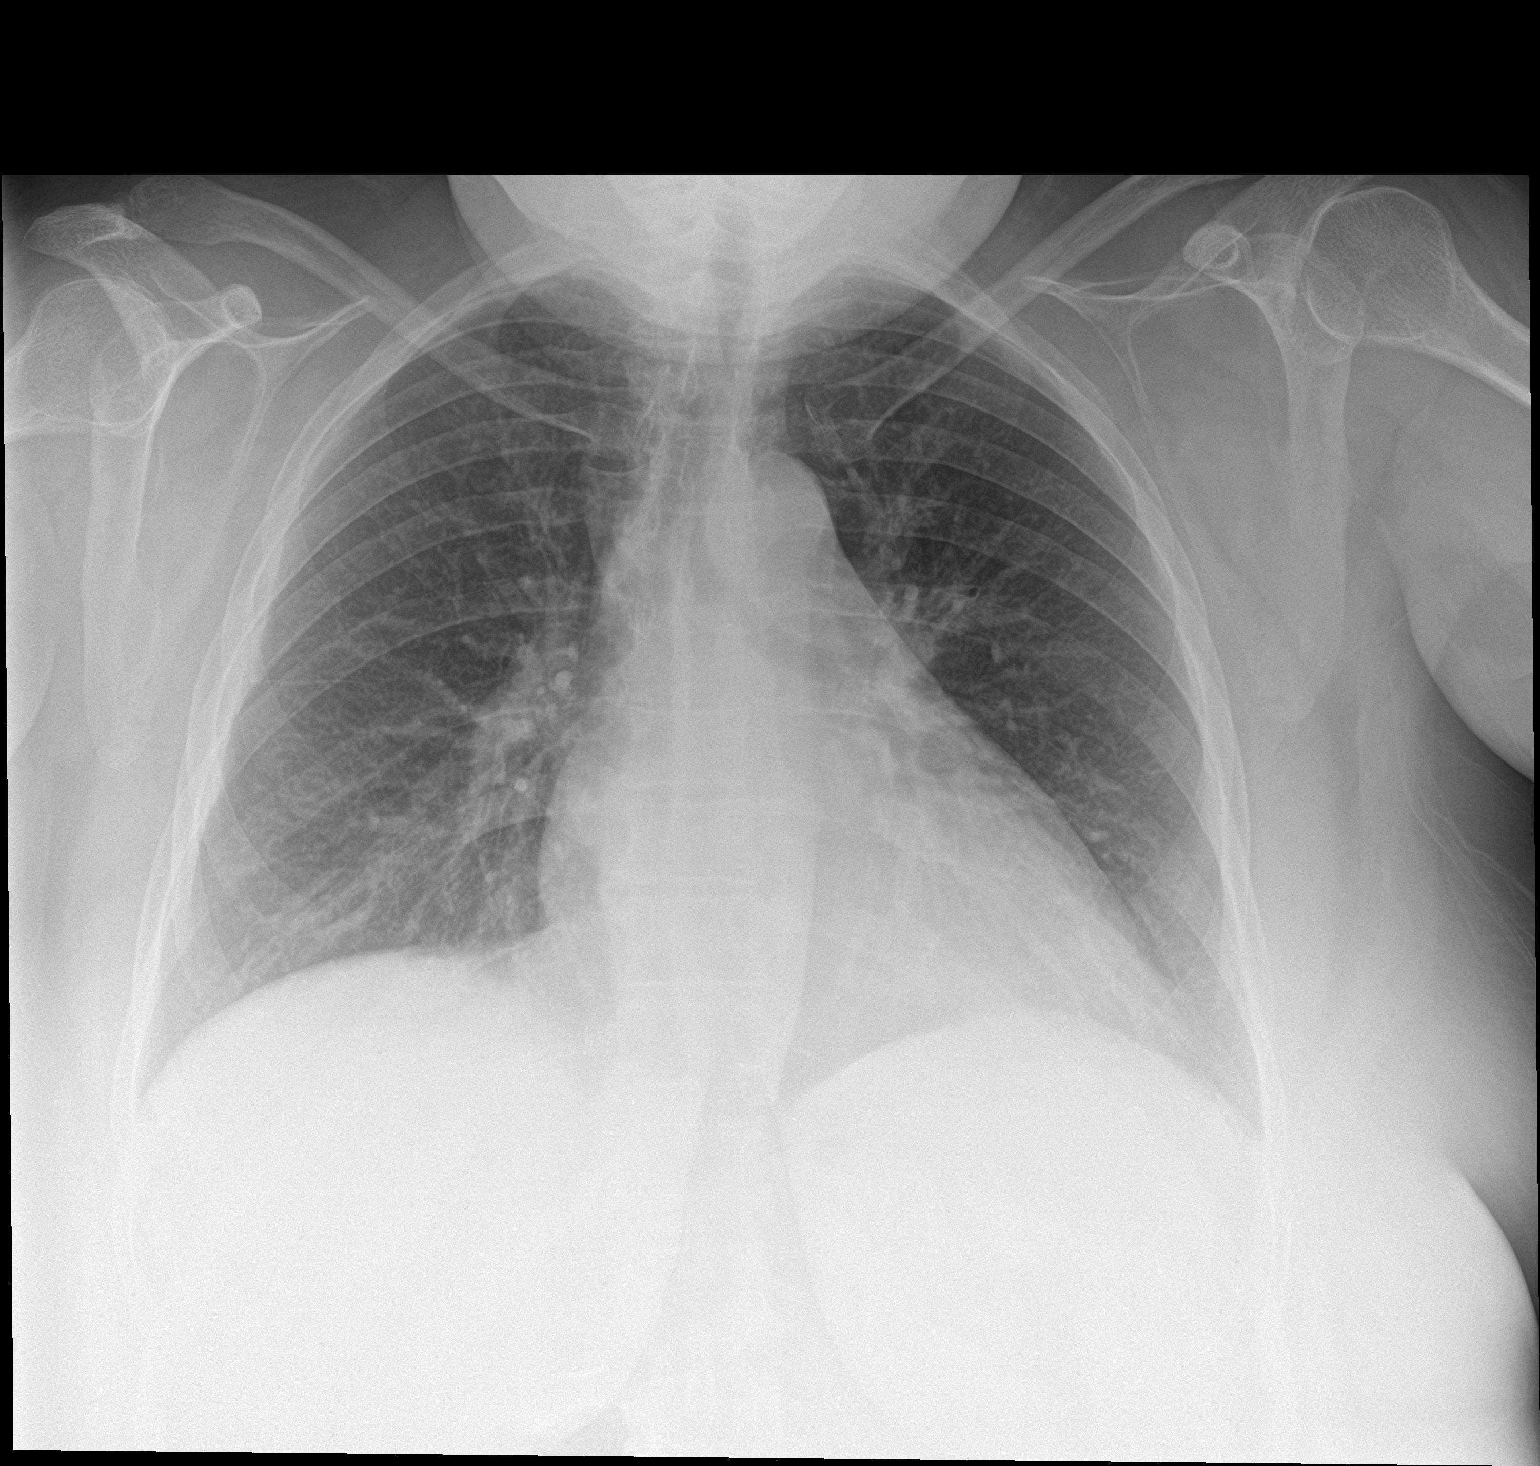

[chest lat]
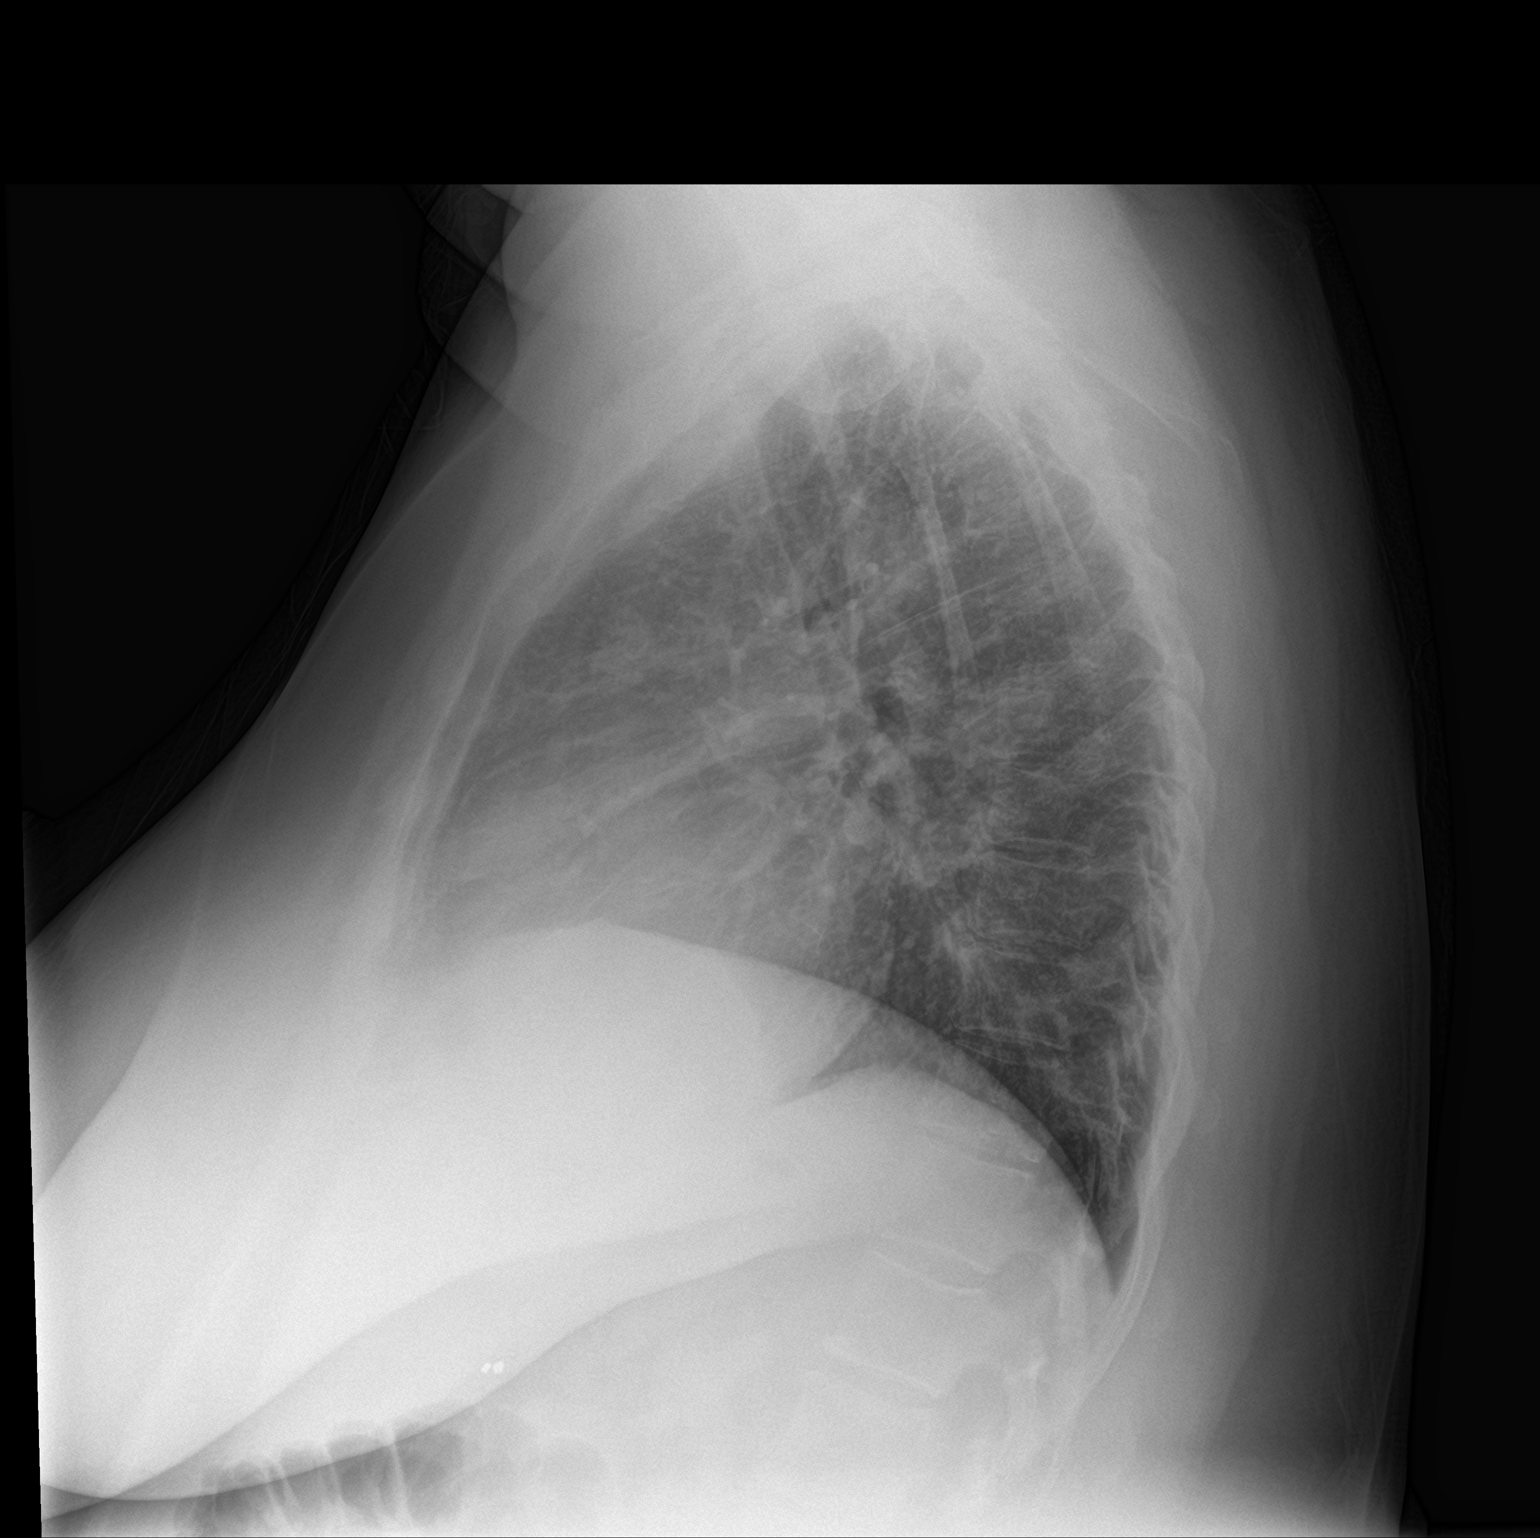

[2 of 2 positions shown; findings below may reference images not displayed]

FINDINGS: Borderline heart size, unchanged. The lungs are clear. The pulmonary
vasculature is normal. There is no pleural effusion. Hilar,
mediastinal and cardiac contours are unremarkable and unchanged.
IMPRESSION: No active cardiopulmonary disease.

## 2017-12-08 ENCOUNTER — Other Ambulatory Visit
Admission: RE | Admit: 2017-12-08 | Discharge: 2017-12-08 | Disposition: A | Discharge status: Home / Self Care | Payer: Medicaid Other | Source: Ambulatory Visit | Attending: Gastroenterology | Admitting: Gastroenterology

## 2017-12-08 DIAGNOSIS — R634 Abnormal weight loss: Secondary | ICD-10-CM | POA: Diagnosis present

## 2017-12-08 DIAGNOSIS — R197 Diarrhea, unspecified: Secondary | ICD-10-CM | POA: Insufficient documentation

## 2017-12-08 LAB — GASTROINTESTINAL PANEL BY PCR, STOOL (REPLACES STOOL CULTURE)
ADENOVIRUS F40/41: NOT DETECTED
Astrovirus: NOT DETECTED
CYCLOSPORA CAYETANENSIS: NOT DETECTED
Campylobacter species: NOT DETECTED
Cryptosporidium: NOT DETECTED
ENTEROAGGREGATIVE E COLI (EAEC): NOT DETECTED
ENTEROPATHOGENIC E COLI (EPEC): NOT DETECTED
Entamoeba histolytica: NOT DETECTED
Enterotoxigenic E coli (ETEC): NOT DETECTED
GIARDIA LAMBLIA: NOT DETECTED
Norovirus GI/GII: NOT DETECTED
Plesimonas shigelloides: NOT DETECTED
Rotavirus A: NOT DETECTED
Salmonella species: NOT DETECTED
Sapovirus (I, II, IV, and V): NOT DETECTED
Shiga like toxin producing E coli (STEC): NOT DETECTED
Shigella/Enteroinvasive E coli (EIEC): NOT DETECTED
VIBRIO CHOLERAE: NOT DETECTED
VIBRIO SPECIES: NOT DETECTED
Yersinia enterocolitica: NOT DETECTED

## 2017-12-08 LAB — C DIFFICILE QUICK SCREEN W PCR REFLEX
C Diff antigen: POSITIVE — AB
C Diff toxin: NEGATIVE

## 2017-12-08 LAB — CLOSTRIDIUM DIFFICILE BY PCR, REFLEXED: Toxigenic C. Difficile by PCR: POSITIVE — AB

## 2017-12-29 ENCOUNTER — Other Ambulatory Visit
Admission: RE | Admit: 2017-12-29 | Discharge: 2017-12-29 | Disposition: A | Discharge status: Home / Self Care | Payer: Medicaid Other | Source: Ambulatory Visit | Attending: Physician Assistant | Admitting: Physician Assistant

## 2017-12-29 DIAGNOSIS — R197 Diarrhea, unspecified: Secondary | ICD-10-CM | POA: Insufficient documentation

## 2017-12-29 DIAGNOSIS — R112 Nausea with vomiting, unspecified: Secondary | ICD-10-CM | POA: Insufficient documentation

## 2017-12-29 DIAGNOSIS — R109 Unspecified abdominal pain: Secondary | ICD-10-CM | POA: Insufficient documentation

## 2018-01-03 SURGERY — COLONOSCOPY WITH PROPOFOL
Anesthesia: General

## 2018-01-04 LAB — CALPROTECTIN, FECAL: Calprotectin, Fecal: 320 ug/g — ABNORMAL HIGH (ref 0–120)

## 2018-01-05 ENCOUNTER — Other Ambulatory Visit
Admission: RE | Admit: 2018-01-05 | Discharge: 2018-01-05 | Disposition: A | Discharge status: Home / Self Care | Payer: Medicaid Other | Source: Ambulatory Visit | Attending: Gastroenterology | Admitting: Gastroenterology

## 2018-01-05 DIAGNOSIS — R197 Diarrhea, unspecified: Secondary | ICD-10-CM | POA: Insufficient documentation

## 2018-01-05 LAB — GASTROINTESTINAL PANEL BY PCR, STOOL (REPLACES STOOL CULTURE)

## 2018-01-05 LAB — C DIFFICILE QUICK SCREEN W PCR REFLEX
C Diff antigen: POSITIVE — AB
C Diff toxin: NEGATIVE

## 2018-01-05 LAB — CLOSTRIDIUM DIFFICILE BY PCR, REFLEXED: CDIFFPCR: POSITIVE — AB

## 2018-01-08 ENCOUNTER — Inpatient Hospital Stay
Admission: EM | Admit: 2018-01-08 | Discharge: 2018-01-11 | DRG: 372 | Disposition: A | Discharge status: Home / Self Care | Payer: Medicaid Other | Attending: Internal Medicine | Admitting: Internal Medicine

## 2018-01-08 ENCOUNTER — Encounter: Payer: Self-pay | Admitting: Emergency Medicine

## 2018-01-08 DIAGNOSIS — J45909 Unspecified asthma, uncomplicated: Secondary | ICD-10-CM | POA: Diagnosis present

## 2018-01-08 DIAGNOSIS — K219 Gastro-esophageal reflux disease without esophagitis: Secondary | ICD-10-CM | POA: Diagnosis present

## 2018-01-08 DIAGNOSIS — Z6836 Body mass index (BMI) 36.0-36.9, adult: Secondary | ICD-10-CM

## 2018-01-08 DIAGNOSIS — D509 Iron deficiency anemia, unspecified: Secondary | ICD-10-CM | POA: Diagnosis present

## 2018-01-08 DIAGNOSIS — A0471 Enterocolitis due to Clostridium difficile, recurrent: Principal | ICD-10-CM | POA: Diagnosis present

## 2018-01-08 DIAGNOSIS — N179 Acute kidney failure, unspecified: Secondary | ICD-10-CM | POA: Diagnosis present

## 2018-01-08 DIAGNOSIS — K922 Gastrointestinal hemorrhage, unspecified: Secondary | ICD-10-CM | POA: Diagnosis present

## 2018-01-08 DIAGNOSIS — Z95810 Presence of automatic (implantable) cardiac defibrillator: Secondary | ICD-10-CM | POA: Diagnosis not present

## 2018-01-08 DIAGNOSIS — I2510 Atherosclerotic heart disease of native coronary artery without angina pectoris: Secondary | ICD-10-CM | POA: Diagnosis present

## 2018-01-08 DIAGNOSIS — E860 Dehydration: Secondary | ICD-10-CM | POA: Diagnosis present

## 2018-01-08 DIAGNOSIS — Z7951 Long term (current) use of inhaled steroids: Secondary | ICD-10-CM

## 2018-01-08 DIAGNOSIS — Z8614 Personal history of Methicillin resistant Staphylococcus aureus infection: Secondary | ICD-10-CM | POA: Diagnosis not present

## 2018-01-08 DIAGNOSIS — Z9049 Acquired absence of other specified parts of digestive tract: Secondary | ICD-10-CM

## 2018-01-08 DIAGNOSIS — E669 Obesity, unspecified: Secondary | ICD-10-CM | POA: Diagnosis present

## 2018-01-08 DIAGNOSIS — G2581 Restless legs syndrome: Secondary | ICD-10-CM | POA: Diagnosis present

## 2018-01-08 DIAGNOSIS — E876 Hypokalemia: Secondary | ICD-10-CM | POA: Diagnosis not present

## 2018-01-08 DIAGNOSIS — Z7982 Long term (current) use of aspirin: Secondary | ICD-10-CM

## 2018-01-08 DIAGNOSIS — Z86718 Personal history of other venous thrombosis and embolism: Secondary | ICD-10-CM

## 2018-01-08 DIAGNOSIS — Z79899 Other long term (current) drug therapy: Secondary | ICD-10-CM | POA: Diagnosis not present

## 2018-01-08 DIAGNOSIS — I5022 Chronic systolic (congestive) heart failure: Secondary | ICD-10-CM | POA: Diagnosis present

## 2018-01-08 DIAGNOSIS — A0472 Enterocolitis due to Clostridium difficile, not specified as recurrent: Secondary | ICD-10-CM | POA: Diagnosis not present

## 2018-01-08 HISTORY — DX: Gastrointestinal hemorrhage, unspecified: K92.2

## 2018-01-08 LAB — CBC
HCT: 33.7 % — ABNORMAL LOW (ref 35.0–47.0)
Hemoglobin: 11.6 g/dL — ABNORMAL LOW (ref 12.0–16.0)
MCH: 26.9 pg (ref 26.0–34.0)
MCHC: 34.3 g/dL (ref 32.0–36.0)
MCV: 78.3 fL — AB (ref 80.0–100.0)
PLATELETS: 322 10*3/uL (ref 150–440)
RBC: 4.3 MIL/uL (ref 3.80–5.20)
RDW: 17.6 % — AB (ref 11.5–14.5)
WBC: 10.5 10*3/uL (ref 3.6–11.0)

## 2018-01-08 LAB — COMPREHENSIVE METABOLIC PANEL
ALT: 17 U/L (ref 14–54)
AST: 26 U/L (ref 15–41)
Albumin: 3.6 g/dL (ref 3.5–5.0)
Alkaline Phosphatase: 104 U/L (ref 38–126)
Anion gap: 12 (ref 5–15)
BUN: 9 mg/dL (ref 6–20)
CHLORIDE: 92 mmol/L — AB (ref 101–111)
CO2: 33 mmol/L — AB (ref 22–32)
CREATININE: 1.11 mg/dL — AB (ref 0.44–1.00)
Calcium: 9.6 mg/dL (ref 8.9–10.3)
GFR calc Af Amer: >60 mL/min (ref >60)
GFR calc non Af Amer: 57 mL/min — ABNORMAL LOW (ref >60)
Glucose, Bld: 119 mg/dL — ABNORMAL HIGH (ref 65–99)
Potassium: 2.5 mmol/L — CL (ref 3.5–5.1)
SODIUM: 137 mmol/L (ref 135–145)
Total Bilirubin: 0.4 mg/dL (ref 0.3–1.2)
Total Protein: 7.8 g/dL (ref 6.5–8.1)

## 2018-01-08 LAB — LIPASE, BLOOD: LIPASE: 38 U/L (ref 11–51)

## 2018-01-08 LAB — TYPE AND SCREEN
ABO/RH(D): O NEG
Antibody Screen: NEGATIVE

## 2018-01-08 MED ORDER — TRAZODONE HCL 50 MG PO TABS: 25.0000 mg | ORAL_TABLET | Freq: Every evening | ORAL | Status: DC | PRN

## 2018-01-08 MED ORDER — ONDANSETRON HCL 4 MG/2ML IJ SOLN: 4.0000 mg | Freq: Four times a day (QID) | INTRAMUSCULAR | Status: DC | PRN

## 2018-01-08 MED ORDER — ACETAMINOPHEN 325 MG PO TABS
650.0000 mg | ORAL_TABLET | Freq: Four times a day (QID) | ORAL | Status: DC | PRN
  Filled 2018-01-08: qty 2

## 2018-01-08 MED ORDER — DICYCLOMINE HCL 10 MG PO CAPS: 10.0000 mg | ORAL_CAPSULE | Freq: Three times a day (TID) | ORAL | Status: DC | PRN

## 2018-01-08 MED ORDER — HYDROMORPHONE HCL 1 MG/ML IJ SOLN
1.0000 mg | INTRAMUSCULAR | Status: DC | PRN
  Administered 2018-01-09: 1 mg via INTRAVENOUS
  Filled 2018-01-08: qty 1

## 2018-01-08 MED ORDER — ROPINIROLE HCL 1 MG PO TABS
2.0000 mg | ORAL_TABLET | Freq: Every day | ORAL | Status: DC
  Administered 2018-01-09 – 2018-01-10 (×2): 4 mg via ORAL
  Filled 2018-01-08 (×2): qty 4

## 2018-01-08 MED ORDER — VANCOMYCIN 50 MG/ML ORAL SOLUTION
125.0000 mg | Freq: Four times a day (QID) | ORAL | Status: DC
  Administered 2018-01-09: 125 mg via ORAL
  Filled 2018-01-08 (×5): qty 2.5

## 2018-01-08 MED ORDER — HYDROCODONE-ACETAMINOPHEN 5-325 MG PO TABS
1.0000 | ORAL_TABLET | ORAL | Status: DC | PRN
  Administered 2018-01-10 – 2018-01-11 (×4): 2 via ORAL
  Filled 2018-01-08 (×4): qty 2

## 2018-01-08 MED ORDER — METRONIDAZOLE IN NACL 5-0.79 MG/ML-% IV SOLN
500.0000 mg | Freq: Once | INTRAVENOUS | Status: AC
  Administered 2018-01-08: 500 mg via INTRAVENOUS
  Filled 2018-01-08: qty 100

## 2018-01-08 MED ORDER — SODIUM CHLORIDE 0.9 % IV SOLN
8.0000 mg/h | INTRAVENOUS | Status: DC
  Administered 2018-01-08 – 2018-01-09 (×2): 8 mg/h via INTRAVENOUS
  Filled 2018-01-08 (×2): qty 80

## 2018-01-08 MED ORDER — ONDANSETRON HCL 4 MG PO TABS: 4.0000 mg | ORAL_TABLET | Freq: Four times a day (QID) | ORAL | Status: DC | PRN

## 2018-01-08 MED ORDER — BECLOMETHASONE DIPROPIONATE 80 MCG/ACT IN AERS: 2.0000 | INHALATION_SPRAY | Freq: Two times a day (BID) | RESPIRATORY_TRACT | Status: DC

## 2018-01-08 MED ORDER — ACETAMINOPHEN 650 MG RE SUPP: 650.0000 mg | Freq: Four times a day (QID) | RECTAL | Status: DC | PRN

## 2018-01-08 MED ORDER — DOCUSATE SODIUM 100 MG PO CAPS
100.0000 mg | ORAL_CAPSULE | Freq: Two times a day (BID) | ORAL | Status: DC
  Filled 2018-01-08 (×2): qty 1

## 2018-01-08 MED ORDER — SODIUM CHLORIDE 0.9 % IV SOLN
80.0000 mg | Freq: Once | INTRAVENOUS | Status: AC
  Administered 2018-01-08: 23:00:00 80 mg via INTRAVENOUS
  Filled 2018-01-08: qty 80

## 2018-01-08 MED ORDER — SODIUM CHLORIDE 0.9 % IV SOLN
Freq: Once | INTRAVENOUS | Status: AC
  Administered 2018-01-09: 01:00:00 via INTRAVENOUS

## 2018-01-08 MED ORDER — DICYCLOMINE HCL 10 MG PO CAPS
10.0000 mg | ORAL_CAPSULE | Freq: Once | ORAL | Status: AC
  Administered 2018-01-08: 10 mg via ORAL
  Filled 2018-01-08: qty 1

## 2018-01-08 MED ORDER — SPIRONOLACTONE 25 MG PO TABS
50.0000 mg | ORAL_TABLET | Freq: Every day | ORAL | Status: DC
  Administered 2018-01-09 – 2018-01-11 (×3): 50 mg via ORAL
  Filled 2018-01-08 (×3): qty 2

## 2018-01-08 MED ORDER — BISACODYL 5 MG PO TBEC: 5.0000 mg | DELAYED_RELEASE_TABLET | Freq: Every day | ORAL | Status: DC | PRN

## 2018-01-08 MED ORDER — METOPROLOL SUCCINATE ER 50 MG PO TB24
50.0000 mg | ORAL_TABLET | Freq: Every day | ORAL | Status: DC
  Administered 2018-01-09 – 2018-01-11 (×3): 50 mg via ORAL
  Filled 2018-01-08 (×3): qty 1

## 2018-01-08 MED ORDER — CYANOCOBALAMIN 500 MCG PO TABS
500.0000 ug | ORAL_TABLET | Freq: Every day | ORAL | Status: DC
  Administered 2018-01-09 – 2018-01-11 (×3): 500 ug via ORAL
  Filled 2018-01-08 (×3): qty 1

## 2018-01-08 MED ORDER — LORATADINE 10 MG PO TABS
10.0000 mg | ORAL_TABLET | Freq: Every day | ORAL | Status: DC
  Administered 2018-01-09 – 2018-01-11 (×3): 10 mg via ORAL
  Filled 2018-01-08 (×3): qty 1

## 2018-01-08 MED ORDER — LOSARTAN POTASSIUM 25 MG PO TABS
12.5000 mg | ORAL_TABLET | Freq: Two times a day (BID) | ORAL | Status: DC
  Administered 2018-01-09 – 2018-01-11 (×5): 12.5 mg via ORAL
  Filled 2018-01-08 (×5): qty 1

## 2018-01-08 MED ORDER — PANTOPRAZOLE SODIUM 40 MG IV SOLR: 40.0000 mg | Freq: Two times a day (BID) | INTRAVENOUS | Status: DC

## 2018-01-08 MED ORDER — TRAMADOL HCL 50 MG PO TABS: 50.0000 mg | ORAL_TABLET | Freq: Four times a day (QID) | ORAL | Status: DC | PRN

## 2018-01-08 MED ORDER — NITROGLYCERIN 0.4 MG SL SUBL: 0.4000 mg | SUBLINGUAL_TABLET | SUBLINGUAL | Status: DC | PRN

## 2018-01-08 NOTE — ED Provider Notes (Signed)
[facility] Emergency Department Provider Note  ____________________________________________   I have reviewed the triage vital signs and the nursing notes.   HISTORY  Chief Complaint Abdominal Pain and Rectal Bleeding   History limited by: Not Limited   HPI Brittany Nguyen is a 52 y.o. female who presents to the emergency department today because of concerns for GI bleed.  Patient states that the bleeding started 2 days ago.  It is bright red.  She has had some bowel movements which are just blood.  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  Patient states that she is working with her GI doctor.  She is now finished 210-day courses of oral vancomycin.  Had her stool rechecked 3 days ago and was told it is still positive.  She is complaining of diffuse abdominal cramping.  She also has had nausea.  She feels like she has not been able to keep anything in her stomach. Has had dizziness.    Per medical record review patient has a history of CHF, diverticulitis.   Past Medical History:  Diagnosis Date  . Asthma 2013  . CHF (congestive heart failure) ([place])   . Diverticulitis 2010  . DVT (deep venous thrombosis) ([place])   . Endometriosis 1990  . Heart disease 2013  . Hx MRSA infection   . Lump or mass in breast   . Restless leg     Patient Active Problem List   Diagnosis Date Noted  . SOB (shortness of breath) 11/01/2017  . Influenza A 10/29/2017  . Acute respiratory failure ([place]) 09/24/2017  . Sepsis ([place]) 07/03/2017  . COPD with acute exacerbation ([place]) 04/26/2017  . Community acquired pneumonia 04/25/2017  . Chronic systolic heart failure ([place]) 02/03/2017  . Hypotension 02/03/2017  . Chest pain 12/09/2016  . RLS (restless legs syndrome) 12/09/2016  . Cough, persistent 11/22/2015  . Hypokalemia 11/22/2015  . Leukocytosis 11/22/2015  . Dyspnea 11/21/2015  . Respiratory distress 09/19/2015  . Breast pain 01/11/2013  . Hx  MRSA infection   . Lump or mass in breast   . GOITER, MULTINODULAR 01/20/2009  . HYPOGLYCEMIA, UNSPECIFIED 01/20/2009  . GERD 01/20/2009  . DIVERTICULITIS OF COLON 01/20/2009    Past Surgical History:  Procedure Laterality Date  . ABDOMINAL HYSTERECTOMY     age 49  . CARDIAC DEFIBRILLATOR PLACEMENT    . CESAREAN SECTION    . CHOLECYSTECTOMY    . COLECTOMY    . NASAL SINUS SURGERY  2012    Prior to Admission medications   Medication Sig Start Date End Date Taking? Authorizing Provider  acetaminophen (TYLENOL) 325 MG tablet Take 2 tablets (650 mg total) by mouth every 6 (six) hours as needed for mild pain (or Fever >/= 101). 11/01/17   Nicholes Mango, MD  aspirin EC 81 MG tablet Take 81 mg by mouth daily.    [provider]  beclomethasone (QVAR) 80 MCG/ACT inhaler Inhale 2 puffs into the lungs 2 (two) times daily. 06/05/17 06/05/18  Laverle Hobby, MD  chlorpheniramine-HYDROcodone (TUSSIONEX) 10-8 MG/5ML SUER Take 5 mLs by mouth every 12 (twelve) hours as needed for cough. 11/01/17   Nicholes Mango, MD  cyanocobalamin 500 MCG tablet Take 500 mcg by mouth daily.    [provider]  furosemide (LASIX) 40 MG tablet Take 1 tablet (40 mg total) by mouth 2 (two) times daily. 09/27/17   Loletha Grayer, MD  losartan (COZAAR) 25 MG tablet Take 12.5 mg by mouth 2 (  two) times daily.    [provider]  metoprolol succinate (TOPROL-XL) 50 MG 24 hr tablet Take 1 tablet (50 mg total) by mouth daily. Take with or immediately following a meal. 09/27/17   Loletha Grayer, MD  nitroGLYCERIN (NITROSTAT) 0.4 MG SL tablet Place 1 tablet (0.4 mg total) under the tongue every 5 (five) minutes as needed for chest pain. 01/24/17   Henreitta Leber, MD  oseltamivir (TAMIFLU) 75 MG capsule Take 1 capsule (75 mg total) by mouth 2 (two) times daily. 11/01/17   Gouru, Illene Silver, MD  predniSONE (STERAPRED UNI-PAK 21 TAB) 10 MG (21) TBPK tablet Take 1 tablet (10 mg total) by mouth daily. Take 6  tablets by mouth for 1 day followed by  5 tablets by mouth for 1 day followed by  4 tablets by mouth for 1 day followed by  3 tablets by mouth for 1 day followed by  2 tablets by mouth for 1 day followed by  1 tablet by mouth for a day and stop 11/01/17   Nicholes Mango, MD  promethazine (PHENERGAN) 6.25 MG/5ML syrup Take 10 mLs (12.5 mg total) by mouth every 6 (six) hours as needed for nausea or vomiting. 11/01/17 11/01/18  Nicholes Mango, MD  rOPINIRole (REQUIP) 2 MG tablet Take 1 tablet (2 mg total) by mouth at bedtime. 2-4  Mg per night Patient taking differently: Take 4 mg by mouth at bedtime.  04/26/17   Bettey Costa, MD  spironolactone (ALDACTONE) 50 MG tablet Take 50 mg by mouth daily.    [provider]  traMADol (ULTRAM) 50 MG tablet Take 1 tablet (50 mg total) by mouth every 6 (six) hours as needed. 11/01/17   Nicholes Mango, MD    Allergies Contrast media [iodinated diagnostic agents]; Metrizamide; Penicillins; Isosorbide nitrate; Latex; Ondansetron; Povidone-iodine; and Pulmicort [budesonide]  Family History  Problem Relation Age of Onset  . Cancer Mother 30       ovarian  . CAD Mother   . Cancer Father 2       brain  . CAD Father   . Cancer Daughter 18       skin  . Cancer Maternal Aunt 93       breast    Social History Social History   Tobacco Use  . Smoking status: Never Smoker  . Smokeless tobacco: Never Used  Substance Use Topics  . Alcohol use: No  . Drug use: No    Review of Systems Constitutional: No fever/chills Eyes: No visual changes. ENT: No sore throat. Cardiovascular: Denies chest pain. Respiratory: Denies shortness of breath. Gastrointestinal: Positive for abdominal cramping and gi bleed.  Genitourinary: Negative for dysuria. Musculoskeletal: Negative for back pain. Skin: Negative for rash. Neurological: Positive for dizziness.  ____________________________________________   PHYSICAL EXAM:  VITAL SIGNS: ED Triage Vitals  Enc Vitals  Group     BP 01/08/18 1852 137/78     Pulse Rate 01/08/18 1852 (!) 115     Resp 01/08/18 1852 18     Temp 01/08/18 1852 98.7 F (37.1 C)     Temp Source 01/08/18 1852 Oral     SpO2 01/08/18 1852 97 %     Weight 01/08/18 1853 225 lb (102.1 kg)     Height 01/08/18 1853 5\' 5"  (1.651 m)     Head Circumference --      Peak Flow --      Pain Score 01/08/18 1853 8   Constitutional: Alert and oriented. Well appearing and in  no distress. Eyes: Conjunctivae are normal.  ENT   Head: Normocephalic and atraumatic.   Nose: No congestion/rhinnorhea.   Mouth/Throat: Mucous membranes are moist.   Neck: No stridor. Hematological/Lymphatic/Immunilogical: No cervical lymphadenopathy. Cardiovascular: Normal rate, regular rhythm.  No murmurs, rubs, or gallops.  Respiratory: Normal respiratory effort without tachypnea nor retractions. Breath sounds are clear and equal bilaterally. No wheezes/rales/rhonchi. Gastrointestinal: Soft and diffusely tender. No rebound. No guarding.  Genitourinary: Deferred Musculoskeletal: Normal range of motion in all extremities. No lower extremity edema. Neurologic:  Normal speech and language. No gross focal neurologic deficits are appreciated.  Skin:  Skin is warm, dry and intact. No rash noted. Psychiatric: Mood and affect are normal. Speech and behavior are normal. Patient exhibits appropriate insight and judgment.  ____________________________________________    LABS (pertinent positives/negatives)  CMP k 2.5, glu 119, cr 1.11 CBC wbc 10.5, hgb 11.6, plt 322 Lipase 38  ____________________________________________   EKG  None  ____________________________________________    RADIOLOGY  None  ____________________________________________   PROCEDURES  Procedures  ____________________________________________   INITIAL IMPRESSION / ASSESSMENT AND PLAN / ED COURSE  Pertinent labs & imaging results that were available during my care of  the patient were reviewed by me and considered in my medical decision making (see chart for details).  Presented to the emergency department today because of concerns for abdominal pain and GI bleed.  Patient is being followed by GI for C. difficile.  She has had 2 rounds antibiotics however she started the GI bleed.  On exam patient does have somewhat diffuse abdominal tenderness.  Patient's hemoglobin is fairly consistent with what it was last time.  However given reality red stool and history of C. difficile will plan on admission.  Will start Protonix. Discussed findings and plan with patient.   ________________________________________   FINAL CLINICAL IMPRESSION(S) / ED DIAGNOSES  Final diagnoses:  Gastrointestinal hemorrhage, unspecified gastrointestinal hemorrhage type  Hypokalemia     Note: This dictation was prepared with Dragon dictation. Any transcriptional errors that result from this process are unintentional     Nance Pear, MD 01/09/18 [phone]

## 2018-01-08 NOTE — ED Triage Notes (Addendum)
Pt arrived with complaints of lower abdominal pain that feels like cramps. Pt reports poor appetite for last week. Pt has recently been treated for cdiff. Pt called her doctor and told them about her having episodes of "blood in her stool" and he wanted her to be evaluated in the ED.

## 2018-01-08 NOTE — H&P (Signed)
[facility] [place] NAME: Brittany Nguyen    MR#:  [id number]  DATE OF BIRTH:  1965/10/19  DATE OF ADMISSION:  01/08/2018  PRIMARY CARE PHYSICIAN: [name], MD   REQUESTING/REFERRING PHYSICIAN:   CHIEF COMPLAINT:   Chief Complaint  Patient presents with  . Abdominal Pain  . Rectal Bleeding    HISTORY OF PRESENT ILLNESS: Brittany Nguyen  is a 52 y.o. female with a known history of diverticulitis, endometriosis, asthma and CHF. Patient presented to emergency room for severe lower abdominal cramps and diarrhea with dark and bright red blood, going on for the past 2 days.  Nausea is associated, no vomiting.  No fever/chills. Patient has been following with her GI doctor for recurrent C. difficile colitis.  She was treated twice with oral vancomycin 10-day course in the past month.  Per patient, she improved clinically on vancomycin.  However, when her stool sample was checked 3 days ago by the GI office, she was told it was positive again for C. Difficile. Blood test done emergency room was remarkable for hypokalemia at 2.5.  Hemoglobin level is 11.6 and creatinine level is 1.11. Patient is admitted for further evaluation and treatment.  PAST MEDICAL HISTORY:   Past Medical History:  Diagnosis Date  . Asthma 2013  . CHF (congestive heart failure) ([place])   . Diverticulitis 2010  . DVT (deep venous thrombosis) ([place])   . Endometriosis 1990  . Heart disease 2013  . Hx MRSA infection   . Lump or mass in breast   . Restless leg     PAST SURGICAL HISTORY:  Past Surgical History:  Procedure Laterality Date  . ABDOMINAL HYSTERECTOMY     age 77  . CARDIAC DEFIBRILLATOR PLACEMENT    . CESAREAN SECTION    . CHOLECYSTECTOMY    . COLECTOMY    . NASAL SINUS SURGERY  2012    SOCIAL HISTORY:  Social History   Tobacco Use  . Smoking status: Never Smoker  . Smokeless tobacco: Never Used  Substance Use Topics  . Alcohol use: No     FAMILY HISTORY:  Family History  Problem Relation Age of Onset  . Cancer Mother 30       ovarian  . CAD Mother   . Cancer Father 84       brain  . CAD Father   . Cancer Daughter 18       skin  . Cancer Maternal Aunt 34       breast    DRUG ALLERGIES:  Allergies  Allergen Reactions  . Contrast Media [Iodinated Diagnostic Agents] Shortness Of Breath  . Metrizamide Shortness Of Breath  . Penicillins Hives and Other (See Comments)    Other reaction(s): Other (See Comments) Has patient had a PCN reaction causing immediate rash, facial/tongue/throat swelling, SOB or lightheadedness with hypotension: Yes Has patient had a PCN reaction causing severe rash involving mucus membranes or skin necrosis: No Has patient had a PCN reaction that required hospitalization No Has patient had a PCN reaction occurring within the last 10 years: No If all of the above answers are "NO", then may proceed with Cephalosporin use. Has patient had a PCN reaction causing immediate rash, facial/tongue/throat swelling, SOB or lightheadedness with hypotension: Yes Has patient had a PCN reaction causing severe rash involving mucus membranes or skin necrosis: No Has patient had a PCN reaction that required hospitalization No Has patient had a PCN reaction occurring within  the last 10 years: No If all of the above answers are "NO", then may proceed with Cephalosporin use.   . Isosorbide Nitrate     Other reaction(s): Headache  . Latex Hives  . Ondansetron Other (See Comments)    Severe headache  . Povidone-Iodine Rash    Blistering rash  . Pulmicort [Budesonide] Itching    REVIEW OF SYSTEMS:   CONSTITUTIONAL: No fever, but patient complains of fatigue and generalized weakness.  EYES: No blurred or double vision.  EARS, NOSE, AND THROAT: No tinnitus or ear pain.  RESPIRATORY: No cough, shortness of breath, wheezing or hemoptysis.  CARDIOVASCULAR: No chest pain, orthopnea, edema.  GASTROINTESTINAL:  Positive for diarrhea and lower abdominal cramps.  Positive for blood in stool.  Positive for nausea, no vomiting.  GENITOURINARY: No dysuria, hematuria.  ENDOCRINE: No polyuria, nocturia,  HEMATOLOGY: Positive for GI bleeding SKIN: No rash or lesion. MUSCULOSKELETAL: No joint pain.   NEUROLOGIC: No focal weakness.  PSYCHIATRY: No anxiety or depression.   MEDICATIONS AT HOME:  Prior to Admission medications   Medication Sig Start Date End Date Taking? Authorizing Provider  acetaminophen (TYLENOL) 325 MG tablet Take 2 tablets (650 mg total) by mouth every 6 (six) hours as needed for mild pain (or Fever >/= 101). 11/01/17   Nicholes Mango, MD  aspirin EC 81 MG tablet Take 81 mg by mouth daily.    [provider]  beclomethasone (QVAR) 80 MCG/ACT inhaler Inhale 2 puffs into the lungs 2 (two) times daily. 06/05/17 06/05/18  Laverle Hobby, MD  cetirizine (ZYRTEC) 10 MG tablet Take 10 mg by mouth daily. 01/03/18   [provider]  chlorpheniramine-HYDROcodone (TUSSIONEX) 10-8 MG/5ML SUER Take 5 mLs by mouth every 12 (twelve) hours as needed for cough. Patient not taking: Reported on 01/08/2018 11/01/17   Nicholes Mango, MD  cyanocobalamin 500 MCG tablet Take 500 mcg by mouth daily.    [provider]  dicyclomine (BENTYL) 10 MG capsule Take 10 mg by mouth 3 (three) times daily as needed. 12/29/17   [provider]  furosemide (LASIX) 40 MG tablet Take 1 tablet (40 mg total) by mouth 2 (two) times daily. 09/27/17   Loletha Grayer, MD  HYDROMET 5-1.5 MG/5ML syrup Take 5 mLs by mouth every 6 (six) hours as needed for cough.  01/03/18   [provider]  losartan (COZAAR) 25 MG tablet Take 12.5 mg by mouth 2 (two) times daily.    [provider]  metoprolol succinate (TOPROL-XL) 50 MG 24 hr tablet Take 1 tablet (50 mg total) by mouth daily. Take with or immediately following a meal. 09/27/17   Loletha Grayer, MD  nitroGLYCERIN (NITROSTAT) 0.4 MG SL tablet  Place 1 tablet (0.4 mg total) under the tongue every 5 (five) minutes as needed for chest pain. 01/24/17   Henreitta Leber, MD  oseltamivir (TAMIFLU) 75 MG capsule Take 1 capsule (75 mg total) by mouth 2 (two) times daily. Patient not taking: Reported on 01/08/2018 11/01/17   Nicholes Mango, MD  pantoprazole (PROTONIX) 40 MG tablet Take 40 mg by mouth 2 (two) times daily. 01/02/18   [provider]  predniSONE (DELTASONE) 10 MG tablet Take 10 mg by mouth daily. 01/03/18 01/12/18  [provider]  predniSONE (STERAPRED UNI-PAK 21 TAB) 10 MG (21) TBPK tablet Take 1 tablet (10 mg total) by mouth daily. Take 6 tablets by mouth for 1 day followed by  5 tablets by mouth for 1 day followed by  4  tablets by mouth for 1 day followed by  3 tablets by mouth for 1 day followed by  2 tablets by mouth for 1 day followed by  1 tablet by mouth for a day and stop Patient not taking: Reported on 01/08/2018 11/01/17   Nicholes Mango, MD  promethazine (PHENERGAN) 6.25 MG/5ML syrup Take 10 mLs (12.5 mg total) by mouth every 6 (six) hours as needed for nausea or vomiting. 11/01/17 11/01/18  Nicholes Mango, MD  rOPINIRole (REQUIP) 2 MG tablet Take 1 tablet (2 mg total) by mouth at bedtime. 2-4  Mg per night Patient taking differently: Take 4 mg by mouth at bedtime.  04/26/17   Bettey Costa, MD  spironolactone (ALDACTONE) 50 MG tablet Take 50 mg by mouth daily.    [provider]  traMADol (ULTRAM) 50 MG tablet Take 1 tablet (50 mg total) by mouth every 6 (six) hours as needed. 11/01/17   Nicholes Mango, MD      PHYSICAL EXAMINATION:   VITAL SIGNS: Blood pressure 112/70, pulse 98, temperature 98.7 F (37.1 C), temperature source Oral, resp. rate 18, height 5\' 5"  (1.651 m), weight 102.1 kg (225 lb), last menstrual period 01/22/2012, SpO2 98 %.  GENERAL:  53 y.o.-year-old patient lying in the bed with no acute distress.  EYES: Pupils equal, round, reactive to light and accommodation. No scleral icterus.  Extraocular muscles intact.  HEENT: Head atraumatic, normocephalic. Oropharynx and nasopharynx clear.  NECK:  Supple, no jugular venous distention. No thyroid enlargement, no tenderness.  LUNGS: Normal breath sounds bilaterally, no wheezing, rales,rhonchi or crepitation. No use of accessory muscles of respiration.  CARDIOVASCULAR: S1, S2 normal. No S3/S4.  ABDOMEN: Soft, slightly distended and tender with palpation at the lower abdomen; no rebound.  Bowel sounds present. No organomegaly or mass.  EXTREMITIES: No pedal edema, cyanosis, or clubbing.  NEUROLOGIC: No focal weakness.  Gait not checked, due to severe abdominal pain.  PSYCHIATRIC: The patient is alert and oriented x 3.  SKIN: No obvious rash, lesion, or ulcer.   LABORATORY PANEL:   CBC Recent Labs  Lab 01/08/18 1859  WBC 10.5  HGB 11.6*  HCT 33.7*  PLT 322  MCV 78.3*  MCH 26.9  MCHC 34.3  RDW 17.6*   ------------------------------------------------------------------------------------------------------------------  Chemistries  Recent Labs  Lab 01/08/18 1859  NA 137  K 2.5*  CL 92*  CO2 33*  GLUCOSE 119*  BUN 9  CREATININE 1.11*  CALCIUM 9.6  AST 26  ALT 17  ALKPHOS 104  BILITOT 0.4   ------------------------------------------------------------------------------------------------------------------ estimated creatinine clearance is 72.6 mL/min (A) (by C-G formula based on SCr of 1.11 mg/dL (H)). ------------------------------------------------------------------------------------------------------------------ No results for input(s): TSH, T4TOTAL, T3FREE, THYROIDAB in the last 72 hours.  Invalid input(s): FREET3   Coagulation profile No results for input(s): INR, PROTIME in the last 168 hours. ------------------------------------------------------------------------------------------------------------------- No results for input(s): DDIMER in the last 72  hours. -------------------------------------------------------------------------------------------------------------------  Cardiac Enzymes No results for input(s): CKMB, TROPONINI, MYOGLOBIN in the last 168 hours.  Invalid input(s): CK ------------------------------------------------------------------------------------------------------------------ Invalid input(s): POCBNP  ---------------------------------------------------------------------------------------------------------------  Urinalysis    Component Value Date/Time   COLORURINE YELLOW (A) 10/29/2017 2340   APPEARANCEUR HAZY (A) 10/29/2017 2340   APPEARANCEUR Clear 09/02/2014 2312   LABSPEC 1.021 10/29/2017 2340   LABSPEC 1.013 09/02/2014 2312   PHURINE 5.0 10/29/2017 2340   GLUCOSEU NEGATIVE 10/29/2017 2340   GLUCOSEU Negative 09/02/2014 2312   HGBUR NEGATIVE 10/29/2017 2340   BILIRUBINUR NEGATIVE 10/29/2017 2340   BILIRUBINUR Negative 09/02/2014 2312  KETONESUR NEGATIVE 10/29/2017 2340   PROTEINUR NEGATIVE 10/29/2017 2340   NITRITE NEGATIVE 10/29/2017 2340   LEUKOCYTESUR NEGATIVE 10/29/2017 2340   LEUKOCYTESUR Negative 09/02/2014 2312     RADIOLOGY: No results found.  EKG: Orders placed or performed during the hospital encounter of 10/29/17  . ED EKG 12-Lead  . ED EKG 12-Lead    IMPRESSION AND PLAN:  1.  Acute GI bleed, both hematochezia and melena.  Will check abdominal CAT scan.  We will start Protonix IV.  Continue supportive measures with IV fluids, pain medication and antinausea medication.  Will replace electrolytes per protocol.  We will keep patient n.p.o. for now.  Gastroenterology is consulted for further evaluation and treatment. 2.  Recurrent C. difficile colitis.  We will start IV Flagyl and oral vancomycin. 3.  Hypokalemia, secondary to diarrhea.  Will replace potassium per protocol. 4.  Acute renal failure, likely prerenal, secondary to GI losses.  We will start IV fluids and monitor kidney  function closely.  Avoid nephrotoxic medications.  All the records are reviewed and case discussed with ED provider. Management plans discussed with the patient, family and they are in agreement.  CODE STATUS: Code Status History    Date Active Date Inactive Code Status Order ID Comments User Context   10/30/2017 0113 11/01/2017 1421 Full Code [id number]  Lance Coon, MD ED   09/24/2017 0848 09/27/2017 1409 Full Code [id number]  Loletha Grayer, MD ED   07/03/2017 0654 07/05/2017 1248 Full Code [id number]  Saundra Shelling, MD ED   04/25/2017 0146 04/26/2017 1516 Full Code [id number]  Lyman, [place], DO ED   01/28/2017 1033 01/30/2017 1741 Full Code [id number]  [name], MD Inpatient   01/22/2017 0114 01/24/2017 1759 Full Code [id number]  Lance Coon, MD Inpatient   12/09/2016 0209 12/09/2016 1507 Full Code [id number]  Lance Coon, MD Inpatient   11/21/2015 0650 11/22/2015 1550 Full Code [id number]  Saundra Shelling, MD Inpatient   09/19/2015 0511 09/21/2015 1829 Full Code [id number]  Harrie Foreman, MD Inpatient       TOTAL TIME TAKING CARE OF THIS PATIENT: 45 minutes.    Amelia Jo M.D on 01/08/2018 at 10:35 PM  Between 7am to 6pm - Pager - [phone]  After 6pm go to [email or web address] - password [email or web address] [facility]  [facility]  Office  [phone]  CC: Primary care physician; [name], MD

## 2018-01-08 NOTE — ED Notes (Signed)
Report called to floor

## 2018-01-09 ENCOUNTER — Inpatient Hospital Stay: Payer: Medicaid Other

## 2018-01-09 ENCOUNTER — Other Ambulatory Visit: Payer: Self-pay

## 2018-01-09 DIAGNOSIS — A0472 Enterocolitis due to Clostridium difficile, not specified as recurrent: Secondary | ICD-10-CM

## 2018-01-09 DIAGNOSIS — D509 Iron deficiency anemia, unspecified: Secondary | ICD-10-CM

## 2018-01-09 LAB — BASIC METABOLIC PANEL
ANION GAP: 9 (ref 5–15)
BUN: 10 mg/dL (ref 6–20)
CALCIUM: 8.7 mg/dL — AB (ref 8.9–10.3)
CO2: 34 mmol/L — ABNORMAL HIGH (ref 22–32)
Chloride: 97 mmol/L — ABNORMAL LOW (ref 101–111)
Creatinine, Ser: 1.03 mg/dL — ABNORMAL HIGH (ref 0.44–1.00)
GFR calc non Af Amer: >60 mL/min (ref >60)
Glucose, Bld: 97 mg/dL (ref 65–99)
Potassium: 2.8 mmol/L — ABNORMAL LOW (ref 3.5–5.1)
SODIUM: 140 mmol/L (ref 135–145)

## 2018-01-09 LAB — URINALYSIS, COMPLETE (UACMP) WITH MICROSCOPIC
BILIRUBIN URINE: NEGATIVE
GLUCOSE, UA: NEGATIVE mg/dL
Hgb urine dipstick: NEGATIVE
KETONES UR: NEGATIVE mg/dL
Nitrite: NEGATIVE
PH: 5 (ref 5.0–8.0)
Protein, ur: NEGATIVE mg/dL
SPECIFIC GRAVITY, URINE: 1.009 (ref 1.005–1.030)

## 2018-01-09 LAB — CBC
HCT: 30.9 % — ABNORMAL LOW (ref 35.0–47.0)
HEMOGLOBIN: 10.5 g/dL — AB (ref 12.0–16.0)
MCH: 26.7 pg (ref 26.0–34.0)
MCHC: 34 g/dL (ref 32.0–36.0)
MCV: 78.5 fL — ABNORMAL LOW (ref 80.0–100.0)
PLATELETS: 279 10*3/uL (ref 150–440)
RBC: 3.94 MIL/uL (ref 3.80–5.20)
RDW: 17.9 % — ABNORMAL HIGH (ref 11.5–14.5)
WBC: 8.9 10*3/uL (ref 3.6–11.0)

## 2018-01-09 LAB — GLUCOSE, CAPILLARY: GLUCOSE-CAPILLARY: 91 mg/dL (ref 65–99)

## 2018-01-09 LAB — MAGNESIUM: Magnesium: 2.1 mg/dL (ref 1.7–2.4)

## 2018-01-09 LAB — POTASSIUM: POTASSIUM: 3.4 mmol/L — AB (ref 3.5–5.1)

## 2018-01-09 MED ORDER — HYDROMORPHONE HCL 1 MG/ML IJ SOLN
1.0000 mg | INTRAMUSCULAR | Status: DC | PRN
  Administered 2018-01-09 – 2018-01-11 (×6): 1 mg via INTRAVENOUS
  Filled 2018-01-09 (×6): qty 1

## 2018-01-09 MED ORDER — POTASSIUM CHLORIDE IN NACL 40-0.9 MEQ/L-% IV SOLN
INTRAVENOUS | Status: DC
  Administered 2018-01-09 – 2018-01-10 (×3): 100 mL/h via INTRAVENOUS
  Filled 2018-01-09 (×5): qty 1000

## 2018-01-09 MED ORDER — PANTOPRAZOLE SODIUM 40 MG IV SOLR
40.0000 mg | Freq: Two times a day (BID) | INTRAVENOUS | Status: DC
  Administered 2018-01-09 – 2018-01-11 (×4): 40 mg via INTRAVENOUS
  Filled 2018-01-09 (×4): qty 40

## 2018-01-09 MED ORDER — BOOST / RESOURCE BREEZE PO LIQD CUSTOM
1.0000 | Freq: Three times a day (TID) | ORAL | Status: DC
  Administered 2018-01-09 – 2018-01-10 (×4): 1 via ORAL

## 2018-01-09 MED ORDER — PROMETHAZINE HCL 25 MG/ML IJ SOLN
12.5000 mg | Freq: Four times a day (QID) | INTRAMUSCULAR | Status: DC | PRN
  Administered 2018-01-09 – 2018-01-11 (×4): 12.5 mg via INTRAVENOUS
  Filled 2018-01-09 (×4): qty 1

## 2018-01-09 MED ORDER — ADULT MULTIVITAMIN W/MINERALS CH
1.0000 | ORAL_TABLET | Freq: Every day | ORAL | Status: DC
  Administered 2018-01-10 – 2018-01-11 (×2): 1 via ORAL
  Filled 2018-01-09 (×2): qty 1

## 2018-01-09 MED ORDER — POTASSIUM CHLORIDE CRYS ER 20 MEQ PO TBCR
20.0000 meq | EXTENDED_RELEASE_TABLET | Freq: Once | ORAL | Status: AC
  Administered 2018-01-09: 20 meq via ORAL
  Filled 2018-01-09: qty 1

## 2018-01-09 MED ORDER — BARIUM SULFATE 2.1 % PO SUSP
450.0000 mL | ORAL | Status: AC
  Administered 2018-01-09 (×2): 450 mL via ORAL

## 2018-01-09 MED ORDER — SODIUM CHLORIDE 0.9 % IV SOLN: Freq: Once | INTRAVENOUS | Status: DC

## 2018-01-09 MED ORDER — BUDESONIDE 0.25 MG/2ML IN SUSP
0.2500 mg | Freq: Two times a day (BID) | RESPIRATORY_TRACT | Status: DC
  Administered 2018-01-09 – 2018-01-11 (×5): 0.25 mg via RESPIRATORY_TRACT
  Filled 2018-01-09 (×5): qty 2

## 2018-01-09 MED ORDER — PROCHLORPERAZINE EDISYLATE 10 MG/2ML IJ SOLN
10.0000 mg | INTRAMUSCULAR | Status: DC | PRN
  Administered 2018-01-09 – 2018-01-11 (×5): 10 mg via INTRAVENOUS
  Filled 2018-01-09 (×5): qty 2

## 2018-01-09 MED ORDER — POTASSIUM CHLORIDE 10 MEQ/100ML IV SOLN
10.0000 meq | INTRAVENOUS | Status: AC
Start: 2018-01-09 — End: 2018-01-09
  Administered 2018-01-09 (×4): 10 meq via INTRAVENOUS
  Filled 2018-01-09 (×4): qty 100

## 2018-01-09 MED ORDER — FIDAXOMICIN 200 MG PO TABS
200.0000 mg | ORAL_TABLET | Freq: Two times a day (BID) | ORAL | Status: DC
  Administered 2018-01-09 – 2018-01-11 (×5): 200 mg via ORAL
  Filled 2018-01-09 (×6): qty 1

## 2018-01-09 NOTE — Progress Notes (Signed)
MEDICATION RELATED CONSULT NOTE - INITIAL   Pharmacy Consult for electrolyte management Indication: hypokalemia  Allergies  Allergen Reactions  . Contrast Media [Iodinated Diagnostic Agents] Shortness Of Breath  . Metrizamide Shortness Of Breath  . Penicillins Hives and Other (See Comments)    Other reaction(s): Other (See Comments) Has patient had a PCN reaction causing immediate rash, facial/tongue/throat swelling, SOB or lightheadedness with hypotension: Yes Has patient had a PCN reaction causing severe rash involving mucus membranes or skin necrosis: No Has patient had a PCN reaction that required hospitalization No Has patient had a PCN reaction occurring within the last 10 years: No If all of the above answers are "NO", then may proceed with Cephalosporin use. Has patient had a PCN reaction causing immediate rash, facial/tongue/throat swelling, SOB or lightheadedness with hypotension: Yes Has patient had a PCN reaction causing severe rash involving mucus membranes or skin necrosis: No Has patient had a PCN reaction that required hospitalization No Has patient had a PCN reaction occurring within the last 10 years: No If all of the above answers are "NO", then may proceed with Cephalosporin use.   . Isosorbide Nitrate     Other reaction(s): Headache  . Latex Hives  . Ondansetron Other (See Comments)    Severe headache  . Zofran [Ondansetron Hcl] Other (See Comments)    Causes migraines  . Povidone-Iodine Rash    Blistering rash  . Pulmicort [Budesonide] Itching    Patient Measurements: Height: 5\' 5"  (165.1 cm) Weight: 218 lb 14.7 oz (99.3 kg) IBW/kg (Calculated) : 57  Vital Signs: Temp: 98.2 F (36.8 C) (05/07 0006) Temp Source: Oral (05/07 0006) BP: 112/78 (05/07 0006) Pulse Rate: 98 (05/07 0006) Intake/Output from previous day: 05/06 0701 - 05/07 0700 In: 100 [IV Piggyback:100] Out: -  Intake/Output from this shift: Total I/O In: 100 [IV Piggyback:100] Out: -    Labs: Recent Labs    01/08/18 1859  WBC 10.5  HGB 11.6*  HCT 33.7*  PLT 322  CREATININE 1.11*  ALBUMIN 3.6  PROT 7.8  AST 26  ALT 17  ALKPHOS 104  BILITOT 0.4   Estimated Creatinine Clearance: 71.5 mL/min (A) (by C-G formula based on SCr of 1.11 mg/dL (H)).   Microbiology: Recent Results (from the past 720 hour(s))  Calprotectin, Fecal     Status: Abnormal   Collection Time: 12/29/17 11:45 AM  Result Value Ref Range Status   Calprotectin, Fecal 320 (H) 0 - 120 ug/g Final    Comment: (NOTE) **Results verified by repeat testing** Concentration     Interpretation   Follow-Up <16 - 50 ug/g     Normal           None >50 -120 ug/g     Borderline       Re-evaluate in 4-6 weeks    >120 ug/g     Abnormal         Repeat as clinically                                   indicated Performed At: [facility] [place], [place] [id number] Rush Farmer MD [id number] Performed at [facility], [address] [place], [place] [address]   C difficile quick scan w PCR reflex     Status: Abnormal   Collection Time: 01/05/18  9:45 AM  Result Value Ref Range Status   C Diff antigen POSITIVE (A)  NEGATIVE Final   C Diff toxin NEGATIVE NEGATIVE Final   C Diff interpretation Results are indeterminate. See PCR results.  Final    Comment: Performed at [facility], [address] [place], [place] [address]  Gastrointestinal Panel by PCR , Stool     Status: None   Collection Time: 01/05/18  9:45 AM  Result Value Ref Range Status   Campylobacter species NOT DETECTED NOT DETECTED Final   Plesimonas shigelloides NOT DETECTED NOT DETECTED Final   Salmonella species NOT DETECTED NOT DETECTED Final   Yersinia enterocolitica NOT DETECTED NOT DETECTED Final   Vibrio species NOT DETECTED NOT DETECTED Final   Vibrio cholerae NOT DETECTED NOT DETECTED Final   Enteroaggregative E coli (EAEC) NOT DETECTED NOT DETECTED Final   Enteropathogenic E coli  (EPEC) NOT DETECTED NOT DETECTED Final   Enterotoxigenic E coli (ETEC) NOT DETECTED NOT DETECTED Final   Shiga like toxin producing E coli (STEC) NOT DETECTED NOT DETECTED Final   Shigella/Enteroinvasive E coli (EIEC) NOT DETECTED NOT DETECTED Final   Cryptosporidium NOT DETECTED NOT DETECTED Final   Cyclospora cayetanensis NOT DETECTED NOT DETECTED Final   Entamoeba histolytica NOT DETECTED NOT DETECTED Final   Giardia lamblia NOT DETECTED NOT DETECTED Final   Adenovirus F40/41 NOT DETECTED NOT DETECTED Final   Astrovirus NOT DETECTED NOT DETECTED Final   Norovirus GI/GII NOT DETECTED NOT DETECTED Final   Rotavirus A NOT DETECTED NOT DETECTED Final   Sapovirus (I, II, IV, and V) NOT DETECTED NOT DETECTED Final    Comment: Performed at [facility], [address] [place], [id number] [address]  C. Diff by PCR, Reflexed     Status: Abnormal   Collection Time: 01/05/18  9:45 AM  Result Value Ref Range Status   Toxigenic C. Difficile by PCR POSITIVE (A) NEGATIVE Final    Comment: Positive for toxigenic C. difficile with little to no toxin production. Only treat if clinical presentation suggests symptomatic illness. Performed at [facility], [address] [place], [place] [address]     Medical History: Past Medical History:  Diagnosis Date  . Asthma 2013  . CHF (congestive heart failure) ([place])   . Diverticulitis 2010  . DVT (deep venous thrombosis) ([place])   . Endometriosis 1990  . Heart disease 2013  . Hx MRSA infection   . Lump or mass in breast   . Restless leg     Medications:  Scheduled:  . budesonide (PULMICORT) nebulizer solution  0.25 mg Nebulization BID  . docusate sodium  100 mg Oral BID  . loratadine  10 mg Oral Daily  . losartan  12.5 mg Oral BID  . metoprolol succinate  50 mg Oral Daily  . [START ON 01/12/2018] pantoprazole  40 mg Intravenous Q12H  . rOPINIRole  2-4 mg Oral QHS  . spironolactone  50 mg Oral Daily  . vancomycin  125 mg Oral QID   . vitamin B-12  500 mcg Oral Daily    Assessment: Patient admitted for abdominal pain and rectal bleeding, patient is also having N/V. On BMP K 2.5  Goal of Therapy:  K 3.5 - 4.5  Plan:  Will start patient on fluids of NS + 40 mEq KCI @ 100 ml/hr to provide 96 mEq over 24 hours. Will give an additional 10 mEq IV x 4 for an additional 40 mEq. Total this should theoretical increase K to 3.8. Will monitor electrolytes w/ am labs.  Tobie Lords, PharmD, BCPS Clinical Pharmacist 01/09/2018

## 2018-01-09 NOTE — Consult Note (Signed)
Jonathon Bellows , MD [address] [place], [place], [place], [address] [address], [place], [place], [place], [address] Phone: [phone]  Fax: [phone]  Consultation  Referring Provider:     Dr Posey Pronto  Primary Care Physician:  [name], MD Primary Gastroenterologist:  [facility]          Reason for Consultation:     C diff diarrgea   Date of Admission:  01/08/2018 Date of Consultation:  01/09/2018         HPI:   Orlandria Kissner is a 52 y.o. female known to [facility] and last seen them 12/29/17 for diarrhea, recently in the hospital for pneumonia. Treated by her GI for C diff with Vancomycin for 14 days. 01/05/18 retreated for C diff by Dr Gustavo Lah with Dificid.  Admitted with abdominal pain , some rectal bleeding last 2 days.   Labs suggest a microcytic anemia . Commenced on oral vancomycin and IV flagyl. No gross abnormality on CT scan. She says she is on her third round of antibiotics for C diff and Dificid was ordered but not yet started. Till yesterday was having multiple bowel movements which have improved today , she did have abdominal pain yesterday which has also resolved. Did have some bright red blood only yesterday on her stool which has also resolved. Denies any NSAID use.  Past Medical History:  Diagnosis Date  . Asthma 2013  . CHF (congestive heart failure) ([place])   . Diverticulitis 2010  . DVT (deep venous thrombosis) ([place])   . Endometriosis 1990  . Heart disease 2013  . Hx MRSA infection   . Lump or mass in breast   . Restless leg     Past Surgical History:  Procedure Laterality Date  . ABDOMINAL HYSTERECTOMY     age 42  . CARDIAC DEFIBRILLATOR PLACEMENT    . CESAREAN SECTION    . CHOLECYSTECTOMY    . COLECTOMY    . NASAL SINUS SURGERY  2012    Prior to Admission medications   Medication Sig Start Date End Date Taking? Authorizing Provider  acetaminophen (TYLENOL) 325 MG tablet Take 2 tablets (650 mg total) by mouth every 6 (six) hours as  needed for mild pain (or Fever >/= 101). 11/01/17  Yes Gouru, Illene Silver, MD  albuterol (ACCUNEB) 1.25 MG/3ML nebulizer solution Take 3 mLs by nebulization every 6 (six) hours as needed for wheezing. 11/09/17  Yes [provider]  beclomethasone (QVAR) 80 MCG/ACT inhaler Inhale 2 puffs into the lungs 2 (two) times daily. 06/05/17 06/05/18 Yes Laverle Hobby, MD  cyanocobalamin 500 MCG tablet Take 500 mcg by mouth daily.   Yes [provider]  dicyclomine (BENTYL) 10 MG capsule Take 10 mg by mouth 3 (three) times daily as needed (abdominal pain).  12/29/17  Yes [provider]  ferrous sulfate 325 (65 FE) MG tablet Take 325 mg by mouth daily with breakfast.   Yes [provider]  furosemide (LASIX) 40 MG tablet Take 1 tablet (40 mg total) by mouth 2 (two) times daily. Patient taking differently: Take 80 mg by mouth 2 (two) times daily.  09/27/17  Yes Wieting, Richard, MD  losartan (COZAAR) 25 MG tablet Take 12.5 mg by mouth 2 (two) times daily.   Yes [provider]  metoprolol succinate (TOPROL-XL) 50 MG 24 hr tablet Take 1 tablet (50 mg total) by mouth daily. Take with or immediately following a meal. Patient taking differently: Take 100 mg by mouth  daily. Take with or immediately following a meal. 09/27/17  Yes Wieting, Richard, MD  Multiple Vitamin (MULTIVITAMIN WITH MINERALS) TABS tablet Take 1 tablet by mouth daily.   Yes [provider]  nitroGLYCERIN (NITROSTAT) 0.4 MG SL tablet Place 1 tablet (0.4 mg total) under the tongue every 5 (five) minutes as needed for chest pain. 01/24/17  Yes Henreitta Leber, MD  pantoprazole (PROTONIX) 40 MG tablet Take 40 mg by mouth 2 (two) times daily. 01/02/18  Yes [provider]  rOPINIRole (REQUIP) 2 MG tablet Take 1 tablet (2 mg total) by mouth at bedtime. 2-4  Mg per night Patient taking differently: Take 4 mg by mouth at bedtime.  04/26/17  Yes Mody, Ulice Bold, MD  sertraline (ZOLOFT) 50 MG tablet Take 50  mg by mouth daily.   Yes [provider]  spironolactone (ALDACTONE) 50 MG tablet Take 50 mg by mouth daily.   Yes [provider]  traMADol (ULTRAM) 50 MG tablet Take 1 tablet (50 mg total) by mouth every 6 (six) hours as needed. 11/01/17  Yes Gouru, Illene Silver, MD  chlorpheniramine-HYDROcodone (TUSSIONEX) 10-8 MG/5ML SUER Take 5 mLs by mouth every 12 (twelve) hours as needed for cough. Patient not taking: Reported on 01/08/2018 11/01/17   Nicholes Mango, MD  oseltamivir (TAMIFLU) 75 MG capsule Take 1 capsule (75 mg total) by mouth 2 (two) times daily. Patient not taking: Reported on 01/08/2018 11/01/17   Nicholes Mango, MD  predniSONE (STERAPRED UNI-PAK 21 TAB) 10 MG (21) TBPK tablet Take 1 tablet (10 mg total) by mouth daily. Take 6 tablets by mouth for 1 day followed by  5 tablets by mouth for 1 day followed by  4 tablets by mouth for 1 day followed by  3 tablets by mouth for 1 day followed by  2 tablets by mouth for 1 day followed by  1 tablet by mouth for a day and stop Patient not taking: Reported on 01/08/2018 11/01/17   Nicholes Mango, MD  promethazine (PHENERGAN) 6.25 MG/5ML syrup Take 10 mLs (12.5 mg total) by mouth every 6 (six) hours as needed for nausea or vomiting. Patient not taking: Reported on 01/08/2018 11/01/17 11/01/18  Nicholes Mango, MD    Family History  Problem Relation Age of Onset  . Cancer Mother 30       ovarian  . CAD Mother   . Cancer Father 54       brain  . CAD Father   . Cancer Daughter 18       skin  . Cancer Maternal Aunt 27       breast     Social History   Tobacco Use  . Smoking status: Never Smoker  . Smokeless tobacco: Never Used  Substance Use Topics  . Alcohol use: No  . Drug use: No    Allergies as of 01/08/2018 - Review Complete 01/08/2018  Allergen Reaction Noted  . Contrast media [iodinated diagnostic agents] Shortness Of Breath 11/23/2012  . Metrizamide Shortness Of Breath 11/23/2012  . Penicillins Hives and Other (See Comments)  01/20/2009  . Isosorbide nitrate  01/08/2013  . Latex Hives 01/20/2009  . Ondansetron Other (See Comments) 07/20/2015  . Povidone-iodine Rash 01/20/2009  . Pulmicort [budesonide] Itching 01/23/2017    Review of Systems:    All systems reviewed and negative except where noted in HPI.   Physical Exam:  Vital signs in last 24 hours: Temp:  [98.2 F (36.8 C)-98.7 F (37.1 C)] 98.2 F (36.8 C) (05/07 0518)  Pulse Rate:  [90-115] 95 (05/07 1122) Resp:  [18-20] 20 (05/07 0518) BP: (106-137)/(66-95) 117/68 (05/07 1122) SpO2:  [95 %-99 %] 95 % (05/07 0700) Weight:  [218 lb 14.7 oz (99.3 kg)-225 lb (102.1 kg)] 218 lb 14.7 oz (99.3 kg) (05/07 0006) Last BM Date: 01/09/18 General:   Pleasant, cooperative in NAD Head:  Normocephalic and atraumatic. Eyes:   No icterus.   Conjunctiva pink. PERRLA. Ears:  Normal auditory acuity. Neck:  Supple; no masses or thyroidomegaly Lungs: Respirations even and unlabored. Lungs clear to auscultation bilaterally.   No wheezes, crackles, or rhonchi.  Heart:  Regular rate and rhythm;  Without murmur, clicks, rubs or gallops Abdomen:  Soft, nondistended, nontender. Normal bowel sounds. No appreciable masses or hepatomegaly.  No rebound or guarding.  Neurologic:  Alert and oriented x3;  grossly normal neurologically. Skin:  Intact without significant lesions or rashes. Cervical Nodes:  No significant cervical adenopathy. Psych:  Alert and cooperative. Normal affect.  LAB RESULTS: Recent Labs    01/08/18 1859 01/09/18 0536  WBC 10.5 8.9  HGB 11.6* 10.5*  HCT 33.7* 30.9*  PLT 322 279   BMET Recent Labs    01/08/18 1859 01/09/18 0536  NA 137 140  K 2.5* 2.8*  CL 92* 97*  CO2 33* 34*  GLUCOSE 119* 97  BUN 9 10  CREATININE 1.11* 1.03*  CALCIUM 9.6 8.7*   LFT Recent Labs    01/08/18 1859  PROT 7.8  ALBUMIN 3.6  AST 26  ALT 17  ALKPHOS 104  BILITOT 0.4   PT/INR No results for input(s): LABPROT, INR in the last 72 hours.  STUDIES: Ct  Abdomen Pelvis Wo Contrast  Result Date: 01/09/2018 CLINICAL DATA:  52 year old female with history of diverticulitis and endometriosis presenting with lower abdominal cramping and diarrhea with dark and bright red blood ongoing for the past 2 days. Followed for recurrent C difficile colitis. Stool positive for C difficile despite vancomycin treatment. Prior cholecystectomy, hysterectomy and colectomy. Initial encounter. EXAM: CT ABDOMEN AND PELVIS WITHOUT CONTRAST TECHNIQUE: Multidetector CT imaging of the abdomen and pelvis was performed following the standard protocol without IV contrast. COMPARISON:  02/01/2016 CT. FINDINGS: Lower chest: Stable left base 2.5 mm nodule. AICD leads in the region of the right atrium and right ventricle. Heart size top-normal. Hepatobiliary: Mild fatty infiltration of liver. Taking into account limitation by non contrast imaging, no focal worrisome hepatic lesion. Post cholecystectomy. No calcified common bile duct stone. Pancreas: Taking into account limitation by non contrast imaging, no worrisome pancreatic mass or infiltration. Spleen: Top normal size spleen. Taking into account limitation by non contrast imaging, no focal splenic lesion. Adrenals/Urinary Tract: No obstructing stone or hydronephrosis. Taking into account limitation by non contrast imaging, no worrisome renal adrenal or bladder lesion. Stomach/Bowel: Post sigmoid resection. No colonic inflammatory process identified. Scattered diverticula. Appendix not visualized and may been surgically removed. No small bowel abnormality noted. 2.4 cm fatty lesion suspected within the anterior inferior wall of the gastric body/antrum junction (series 2, image 33, series 6, image 77 and series 5, image 27). Vascular/Lymphatic: No aortic aneurysm.  No adenopathy. Reproductive: Post hysterectomy.  No adnexal mass noted. Other: Small fat containing hernia infraumbilical region with rent measuring 1.1 x 0.9 cm and herniated fat  measuring up to 3.2 cm. No free intraperitoneal air. Musculoskeletal: No worrisome osseous abnormality. Mild degenerative changes most notable L5-S1. IMPRESSION: Post sigmoid resection. No colonic inflammatory process identified. Scattered diverticula. 2.4 cm fatty lesion suspected within the  anterior inferior wall of the gastric body/antrum junction (series 2, image 33, series 6, image 77 and series 5, image 27). Post hysterectomy and cholecystectomy. Small fat containing hernia infraumbilical region. AICD in place. Mild fatty infiltration liver. Electronically Signed   By: Genia Del M.D.   On: 01/09/2018 11:01      Impression / Plan:   Quanika Solem Para is a 52 y.o. y/o female with H/o recurrent C diff diarrhea. Admitted to the hospital with abdominal pain and diarrhea. Some rectal bleeding. Labs suggest a microcytic anemia indicating that the blood loss has been ongoing chronically rather than just  acute. Infact she had microcytosis 3 months back . The question is if she is having a second process such as ulcerative colitis in addition.   Plan  1. Replace electrolytes 2. Continue IV flagyl and Vancomycin  3. Check iron studies, b12,folate and if low replace 4. If does well can be discharged on either vancomycin very slow taper or dificid which I would prefer if available. I did discuss with her that she may very likely need a stool transplant. Once her C diff is resolved she will need evaluation for her microcytic anemia with EGD+colonoscopy +/- capsule study of the small bowel.     Thank you for involving me in the care of this patient.      LOS: 1 day   Jonathon Bellows, MD  01/09/2018, 11:39 AM

## 2018-01-09 NOTE — Progress Notes (Signed)
Pharmacy consulted for electrolyte replacement protocol:   Goal of therapy: Electrolytes within normal limits:  K 3.5 - 5.1 Corrected Ca 8.9 - 10.3 Phos 2.5 - 4.6 Mg 1.7 - 2.4   Assessment: Lab Results  Component Value Date   CREATININE 1.03 (H) 01/09/2018   BUN 10 01/09/2018   NA 140 01/09/2018   K 3.4 (L) 01/09/2018   CL 97 (L) 01/09/2018   CO2 34 (H) 01/09/2018    Plan: K still 3.4 after 3 KCL 3meq iv q1h x 4 doses. Will give KCl 68meq po x 1 and recheck with AM labs tomorrow.     Thomasenia Sales, PharmD, MBA, [facility]

## 2018-01-09 NOTE — Progress Notes (Signed)
Initial Nutrition Assessment  DOCUMENTATION CODES:   Obesity unspecified  INTERVENTION:   Pt likely at high refeeding risk; recommend monitor K, Mg, and P labs once oral intake improves.   Recommend check iron, B12, ferritin, TIBC, and folate labs  Boost Breeze po TID, each supplement provides 250 kcal and 9 grams of protein   MVI daily   Pt may benefit from probiotics once medically appropriate   NUTRITION DIAGNOSIS:   Inadequate oral intake related to acute illness as evidenced by meal completion < 25%.  GOAL:   Patient will meet greater than or equal to 90% of their needs  MONITOR:   PO intake, Supplement acceptance, Labs, Weight trends, I & O's  REASON FOR ASSESSMENT:   Malnutrition Screening Tool    ASSESSMENT:   52 y.o. female with a known history of diverticulitis, endometriosis, recent C-diff, asthma and CHF admitted with GIB, abdominal pain, N/V   Met with pt in room today. Pt reports poor appetite and oral intake for the past month with severely decreased oral intake over the past week r/t nausea and vomting. Pt has mainly been eating saltine crackers and juice for several days. Pt has not tried any supplements as in the past these made her vomit. Per chart, pt has lost 13lbs(6%) with pt reports has been over the past month; this is significant given the time frame. Pt recently treated for C-diff; has been on daily MVI and probiotics at home. Pt with h/o iron deficiency; recommend check iron/anemia labs. Pt initiated on clear liquid diet today. Pt reports that she is still continuing to have nausea but she will try to eat a popcicle. RD will order supplements to help pt meet her estimated protein needs. Pt likely at high refeeding risk; recommend monitor K, Mg, and P labs when oral intake improves.   Medications reviewed and include: colace, protonix, aldactone, vancomycin, B12, NaCl w/ KCl @100ml/hr, hydromorphone, zofran, phenergan  Labs reviewed: K 3.4(L), Cl  97(L), creat 1.03(H), Ca 8.7(L), Mg 2.1 wnl Hgb 10.5(L), Hct 30.9(L), MCV 78.5(L)  Nutrition-Focused physical exam completed. Findings are no fat depletion, no muscle depletion, and no edema.   Diet Order:   Diet Order           Diet clear liquid Room service appropriate? Yes; Fluid consistency: Thin  Diet effective now         EDUCATION NEEDS:   Education needs have been addressed  Skin:  Skin Assessment: Reviewed RN Assessment  Last BM:  5/7- TYPE 7  Height:   Ht Readings from Last 1 Encounters:  01/09/18 5' 5" (1.651 m)    Weight:   Wt Readings from Last 1 Encounters:  01/09/18 218 lb 14.7 oz (99.3 kg)    Ideal Body Weight:  62.5 kg  BMI:  Body mass index is 36.43 kg/m.  Estimated Nutritional Needs:   Kcal:  1800-2100kcal/day   Protein:  99-109g/day   Fluid:  >1.7L/day     MS, RD, LDN Pager #- [phone] After Hours Pager: [phone]  

## 2018-01-09 NOTE — Plan of Care (Signed)
  Problem: Education: Goal: Knowledge of General Education information will improve Outcome: Progressing   Problem: Pain Managment: Goal: General experience of comfort will improve Outcome: Progressing   

## 2018-01-09 NOTE — Progress Notes (Signed)
[place] at [facility]                                                                                                                                                                                  Patient Demographics   Brittany Nguyen, is a 52 y.o. female, DOB - 16-Aug-1966, NUU:[id number]  Admit date - 01/08/2018   Admitting Physician Amelia Jo, MD  Outpatient Primary MD for the patient is [name], MD   LOS - 1  Subjective: Patient admitted with recurrent C. difficile colitis.  Also some blood in the stool. Feeling better diarrhea improved   Review of Systems:   CONSTITUTIONAL: No documented fever. No fatigue, weakness. No weight gain, no weight loss.  EYES: No blurry or double vision.  ENT: No tinnitus. No postnasal drip. No redness of the oropharynx.  RESPIRATORY: No cough, no wheeze, no hemoptysis. No dyspnea.  CARDIOVASCULAR: No chest pain. No orthopnea. No palpitations. No syncope.  GASTROINTESTINAL: No nausea, no vomiting or positive diarrhea. No abdominal pain. No melena or hematochezia.  GENITOURINARY: No dysuria or hematuria.  ENDOCRINE: No polyuria or nocturia. No heat or cold intolerance.  HEMATOLOGY: No anemia. No bruising. No bleeding.  INTEGUMENTARY: No rashes. No lesions.  MUSCULOSKELETAL: No arthritis. No swelling. No gout.  NEUROLOGIC: No numbness, tingling, or ataxia. No seizure-type activity.  PSYCHIATRIC: No anxiety. No insomnia. No ADD.    Vitals:   Vitals:   01/09/18 0006 01/09/18 0518 01/09/18 0700 01/09/18 1122  BP: 112/78 108/66  117/68  Pulse: 98 90  95  Resp: 20 20    Temp: 98.2 F (36.8 C) 98.2 F (36.8 C)    TempSrc: Oral Oral    SpO2: 99% 95% 95%   Weight: 99.3 kg (218 lb 14.7 oz)     Height: 5\' 5"  (1.651 m)       Wt Readings from Last 3 Encounters:  01/09/18 99.3 kg (218 lb 14.7 oz)  11/01/17 104.9 kg (231 lb 3.2 oz)  09/27/17 103.6 kg (228 lb 8 oz)     Intake/Output Summary (Last 24 hours) at  01/09/2018 1531 Last data filed at 01/09/2018 1224 Gross per 24 hour  Intake 1512 ml  Output 700 ml  Net 812 ml    Physical Exam:   GENERAL: Pleasant-appearing in no apparent distress.  HEAD, EYES, EARS, NOSE AND THROAT: Atraumatic, normocephalic. Extraocular muscles are intact. Pupils equal and reactive to light. Sclerae anicteric. No conjunctival injection. No oro-pharyngeal erythema.  NECK: Supple. There is no jugular venous distention. No bruits, no lymphadenopathy, no thyromegaly.  HEART: Regular rate and rhythm,. No murmurs, no rubs, no clicks.  LUNGS: Clear to auscultation  bilaterally. No rales or rhonchi. No wheezes.  ABDOMEN: Soft, flat, nontender, nondistended. Has good bowel sounds. No hepatosplenomegaly appreciated.  EXTREMITIES: No evidence of any cyanosis, clubbing, or peripheral edema.  +2 pedal and radial pulses bilaterally.  NEUROLOGIC: The patient is alert, awake, and oriented x3 with no focal motor or sensory deficits appreciated bilaterally.  SKIN: Moist and warm with no rashes appreciated.  Psych: Not anxious, depressed LN: No inguinal LN enlargement    Antibiotics   Anti-infectives (From admission, onward)   Start     Dose/Rate Route Frequency Ordered Stop   01/09/18 1500  fidaxomicin (DIFICID) tablet 200 mg     200 mg Oral 2 times daily 01/09/18 1459 01/19/18 0959   01/09/18 0000  vancomycin (VANCOCIN) 50 mg/mL oral solution 125 mg  Status:  Discontinued     125 mg Oral 4 times daily 01/08/18 2351 01/09/18 1459   01/08/18 2130  metroNIDAZOLE (FLAGYL) IVPB 500 mg     500 mg 100 mL/hr over 60 Minutes Intravenous  Once 01/08/18 2125 01/08/18 2249      Medications   Scheduled Meds: . budesonide (PULMICORT) nebulizer solution  0.25 mg Nebulization BID  . docusate sodium  100 mg Oral BID  . feeding supplement  1 Container Oral TID BM  . fidaxomicin  200 mg Oral BID  . loratadine  10 mg Oral Daily  . losartan  12.5 mg Oral BID  . metoprolol succinate  50 mg  Oral Daily  . [START ON 01/10/2018] multivitamin with minerals  1 tablet Oral Daily  . pantoprazole (PROTONIX) IV  40 mg Intravenous Q12H  . rOPINIRole  2-4 mg Oral QHS  . spironolactone  50 mg Oral Daily  . vitamin B-12  500 mcg Oral Daily   Continuous Infusions: . 0.9 % NaCl with KCl 40 mEq / L 100 mL/hr (01/09/18 0356)   PRN Meds:.acetaminophen **OR** acetaminophen, bisacodyl, dicyclomine, HYDROcodone-acetaminophen, HYDROmorphone (DILAUDID) injection, nitroGLYCERIN, prochlorperazine, promethazine, traMADol, traZODone   Data Review:   Micro Results Recent Results (from the past 240 hour(s))  C difficile quick scan w PCR reflex     Status: Abnormal   Collection Time: 01/05/18  9:45 AM  Result Value Ref Range Status   C Diff antigen POSITIVE (A) NEGATIVE Final   C Diff toxin NEGATIVE NEGATIVE Final   C Diff interpretation Results are indeterminate. See PCR results.  Final    Comment: Performed at [facility], [address] [place], [place] [address]  Gastrointestinal Panel by PCR , Stool     Status: None   Collection Time: 01/05/18  9:45 AM  Result Value Ref Range Status   Campylobacter species NOT DETECTED NOT DETECTED Final   Plesimonas shigelloides NOT DETECTED NOT DETECTED Final   Salmonella species NOT DETECTED NOT DETECTED Final   Yersinia enterocolitica NOT DETECTED NOT DETECTED Final   Vibrio species NOT DETECTED NOT DETECTED Final   Vibrio cholerae NOT DETECTED NOT DETECTED Final   Enteroaggregative E coli (EAEC) NOT DETECTED NOT DETECTED Final   Enteropathogenic E coli (EPEC) NOT DETECTED NOT DETECTED Final   Enterotoxigenic E coli (ETEC) NOT DETECTED NOT DETECTED Final   Shiga like toxin producing E coli (STEC) NOT DETECTED NOT DETECTED Final   Shigella/Enteroinvasive E coli (EIEC) NOT DETECTED NOT DETECTED Final   Cryptosporidium NOT DETECTED NOT DETECTED Final   Cyclospora cayetanensis NOT DETECTED NOT DETECTED Final   Entamoeba histolytica NOT DETECTED  NOT DETECTED Final   Giardia lamblia NOT DETECTED NOT DETECTED  Final   Adenovirus F40/41 NOT DETECTED NOT DETECTED Final   Astrovirus NOT DETECTED NOT DETECTED Final   Norovirus GI/GII NOT DETECTED NOT DETECTED Final   Rotavirus A NOT DETECTED NOT DETECTED Final   Sapovirus (I, II, IV, and V) NOT DETECTED NOT DETECTED Final    Comment: Performed at [facility], [address] [place], [place] [address]  C. Diff by PCR, Reflexed     Status: Abnormal   Collection Time: 01/05/18  9:45 AM  Result Value Ref Range Status   Toxigenic C. Difficile by PCR POSITIVE (A) NEGATIVE Final    Comment: Positive for toxigenic C. difficile with little to no toxin production. Only treat if clinical presentation suggests symptomatic illness. Performed at [facility], [address], [place],  [address]     Radiology Reports Ct Abdomen Pelvis Wo Contrast  Result Date: 01/09/2018 CLINICAL DATA:  52 year old female with history of diverticulitis and endometriosis presenting with lower abdominal cramping and diarrhea with dark and bright red blood ongoing for the past 2 days. Followed for recurrent C difficile colitis. Stool positive for C difficile despite vancomycin treatment. Prior cholecystectomy, hysterectomy and colectomy. Initial encounter. EXAM: CT ABDOMEN AND PELVIS WITHOUT CONTRAST TECHNIQUE: Multidetector CT imaging of the abdomen and pelvis was performed following the standard protocol without IV contrast. COMPARISON:  02/01/2016 CT. FINDINGS: Lower chest: Stable left base 2.5 mm nodule. AICD leads in the region of the right atrium and right ventricle. Heart size top-normal. Hepatobiliary: Mild fatty infiltration of liver. Taking into account limitation by non contrast imaging, no focal worrisome hepatic lesion. Post cholecystectomy. No calcified common bile duct stone. Pancreas: Taking into account limitation by non contrast imaging, no worrisome pancreatic mass or infiltration.  Spleen: Top normal size spleen. Taking into account limitation by non contrast imaging, no focal splenic lesion. Adrenals/Urinary Tract: No obstructing stone or hydronephrosis. Taking into account limitation by non contrast imaging, no worrisome renal adrenal or bladder lesion. Stomach/Bowel: Post sigmoid resection. No colonic inflammatory process identified. Scattered diverticula. Appendix not visualized and may been surgically removed. No small bowel abnormality noted. 2.4 cm fatty lesion suspected within the anterior inferior wall of the gastric body/antrum junction (series 2, image 33, series 6, image 77 and series 5, image 27). Vascular/Lymphatic: No aortic aneurysm.  No adenopathy. Reproductive: Post hysterectomy.  No adnexal mass noted. Other: Small fat containing hernia infraumbilical region with rent measuring 1.1 x 0.9 cm and herniated fat measuring up to 3.2 cm. No free intraperitoneal air. Musculoskeletal: No worrisome osseous abnormality. Mild degenerative changes most notable L5-S1. IMPRESSION: Post sigmoid resection. No colonic inflammatory process identified. Scattered diverticula. 2.4 cm fatty lesion suspected within the anterior inferior wall of the gastric body/antrum junction (series 2, image 33, series 6, image 77 and series 5, image 27). Post hysterectomy and cholecystectomy. Small fat containing hernia infraumbilical region. AICD in place. Mild fatty infiltration liver. Electronically Signed   By: Genia Del M.D.   On: 01/09/2018 11:01     CBC Recent Labs  Lab 01/08/18 1859 01/09/18 0536  WBC 10.5 8.9  HGB 11.6* 10.5*  HCT 33.7* 30.9*  PLT 322 279  MCV 78.3* 78.5*  MCH 26.9 26.7  MCHC 34.3 34.0  RDW 17.6* 17.9*    Chemistries  Recent Labs  Lab 01/08/18 1859 01/09/18 0536 01/09/18 1125  NA 137 140  --   K 2.5* 2.8* 3.4*  CL 92* 97*  --   CO2 33* 34*  --   GLUCOSE  119* 97  --   BUN 9 10  --   CREATININE 1.11* 1.03*  --   CALCIUM 9.6 8.7*  --   MG  --   --  2.1   AST 26  --   --   ALT 17  --   --   ALKPHOS 104  --   --   BILITOT 0.4  --   --    ------------------------------------------------------------------------------------------------------------------ estimated creatinine clearance is 77.1 mL/min (A) (by C-G formula based on SCr of 1.03 mg/dL (H)). ------------------------------------------------------------------------------------------------------------------ No results for input(s): HGBA1C in the last 72 hours. ------------------------------------------------------------------------------------------------------------------ No results for input(s): CHOL, HDL, LDLCALC, TRIG, CHOLHDL, LDLDIRECT in the last 72 hours. ------------------------------------------------------------------------------------------------------------------ No results for input(s): TSH, T4TOTAL, T3FREE, THYROIDAB in the last 72 hours.  Invalid input(s): FREET3 ------------------------------------------------------------------------------------------------------------------ No results for input(s): VITAMINB12, FOLATE, FERRITIN, TIBC, IRON, RETICCTPCT in the last 72 hours.  Coagulation profile No results for input(s): INR, PROTIME in the last 168 hours.  No results for input(s): DDIMER in the last 72 hours.  Cardiac Enzymes No results for input(s): CKMB, TROPONINI, MYOGLOBIN in the last 168 hours.  Invalid input(s): CK ------------------------------------------------------------------------------------------------------------------ Invalid input(s): [place]  Patient is 52 year old with diarrhea  1.   Acute GI bleed, both hematochezia and melena.    Likely related to diarrhea and possible internal hemorrhoids, GI evaluation pending 2.  Recurrent C. difficile colitis.    This will be her third episode I will discontinue vancomycin start Dificid I will ask ID to see. 3.  Hypokalemia, secondary to diarrhea.  Will replace potassium per  protocol. 4.  Acute renal failure, likely prerenal, secondary to GI losses.    Continue IV fluids follow BMP 5.  Miscellaneous SCDs for DVT prophylaxis       Code Status Orders  (From admission, onward)        Start     Ordered   01/08/18 2351  Full code  Continuous     01/08/18 2351    Code Status History    Date Active Date Inactive Code Status Order ID Comments User Context   10/30/2017 0113 11/01/2017 1421 Full Code [id number]  Lance Coon, MD ED   09/24/2017 0848 09/27/2017 1409 Full Code [id number]  Loletha Grayer, MD ED   07/03/2017 0654 07/05/2017 1248 Full Code [id number]  Saundra Shelling, MD ED   04/25/2017 0146 04/26/2017 1516 Full Code [id number]  Hugelmeyer, Alexis, DO ED   01/28/2017 1033 01/30/2017 1741 Full Code [id number]  [name], MD Inpatient   01/22/2017 0114 01/24/2017 1759 Full Code [id number]  Lance Coon, MD Inpatient   12/09/2016 0209 12/09/2016 1507 Full Code [id number]  Lance Coon, MD Inpatient   11/21/2015 0650 11/22/2015 1550 Full Code [id number]  Saundra Shelling, MD Inpatient   09/19/2015 0511 09/21/2015 1829 Full Code [id number]  Harrie Foreman, MD Inpatient           Consults gastroenterology and GI  DVT Prophylaxis SCDs  Lab Results  Component Value Date   PLT 279 01/09/2018     Time Spent in minutes   35 minutes greater than 50% of time spent in care coordination and counseling patient regarding the condition and plan of care.   Dustin Flock M.D on 01/09/2018 at 3:31 PM  Between 7am to 6pm - Pager - [phone]  After 6pm go to [email or web address] - Proofreader  [employer]   Office  [phone]

## 2018-01-10 DIAGNOSIS — A0471 Enterocolitis due to Clostridium difficile, recurrent: Principal | ICD-10-CM

## 2018-01-10 DIAGNOSIS — K922 Gastrointestinal hemorrhage, unspecified: Secondary | ICD-10-CM

## 2018-01-10 LAB — CBC
HCT: 33.5 % — ABNORMAL LOW (ref 35.0–47.0)
Hemoglobin: 11.1 g/dL — ABNORMAL LOW (ref 12.0–16.0)
MCH: 26.7 pg (ref 26.0–34.0)
MCHC: 33.2 g/dL (ref 32.0–36.0)
MCV: 80.5 fL (ref 80.0–100.0)
PLATELETS: 284 10*3/uL (ref 150–440)
RBC: 4.16 MIL/uL (ref 3.80–5.20)
RDW: 18.2 % — ABNORMAL HIGH (ref 11.5–14.5)
WBC: 8 10*3/uL (ref 3.6–11.0)

## 2018-01-10 LAB — BASIC METABOLIC PANEL
Anion gap: 7 (ref 5–15)
CALCIUM: 8.8 mg/dL — AB (ref 8.9–10.3)
CO2: 30 mmol/L (ref 22–32)
CREATININE: 0.85 mg/dL (ref 0.44–1.00)
Chloride: 105 mmol/L (ref 101–111)
GFR calc Af Amer: >60 mL/min (ref >60)
GLUCOSE: 125 mg/dL — AB (ref 65–99)
Potassium: 3.8 mmol/L (ref 3.5–5.1)
SODIUM: 142 mmol/L (ref 135–145)

## 2018-01-10 LAB — GLUCOSE, CAPILLARY: Glucose-Capillary: 124 mg/dL — ABNORMAL HIGH (ref 65–99)

## 2018-01-10 NOTE — Care Management (Signed)
Per Dr. Posey Pronto, patient states that a 10 day supply of Dificid was mailed to her house yesterday.  Arranged by her outpatient GI MD

## 2018-01-10 NOTE — Consult Note (Signed)
[facility] Infectious Disease     Reason for Consult: C diff   Referring Physician: Serita Grit Date of Admission:  01/08/2018   Active Problems:   GIB (gastrointestinal bleeding)   HPI: Brittany Nguyen is a 52 y.o. female admitted with diarrhea and bloody stools. 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  She has been following in gastroenterology for diarrhea and abdominal pain as well as abdominal cramping after meals.  In January she had a viral upper respiratory infection documented by respiratory PCR and influenza.  She was hospitalized at that time. She then developed abd pain and diarrhea and saw GI. She tested positive for C. difficile on April 5.   With the positive C. difficile results she was treated with oral vancomycin.  However she continued to have severe crampy abdominal pain.  Had also a second round of 10 days vanco. She was seen in follow-up April 26 and still had symptoms.  She had immunoglobulin levels checked which were within normal limits.  She had a sed rate of 48 and a CRP of 9.1.  Her white count was 11.6 at that time.  She was scheduled for colonoscopy but did not have it done.  She was then seen May 1 with sinus symptoms had congestion.  She was having fevers to 101.8 per her report.  At that time was felt she likely had a viral infection or allergies and was given low-dose prednisone and cough syrup.  She was admitted now because she started to develop some bleeding along with the abdominal cramps and diarrhea. She has met multiple other medical problems including coronary artery disease, CHF, DVT, GERD and obesity.   Past Medical History:  Diagnosis Date  . Asthma 2013  . CHF (congestive heart failure) ([place])   . Diverticulitis 2010  . DVT (deep venous thrombosis) ([place])   . Endometriosis 1990  . Heart disease 2013  . Hx MRSA infection   . Lump or mass in breast   . Restless leg    Past Surgical History:  Procedure  Laterality Date  . ABDOMINAL HYSTERECTOMY     age 21  . CARDIAC DEFIBRILLATOR PLACEMENT    . CESAREAN SECTION    . CHOLECYSTECTOMY    . COLECTOMY    . NASAL SINUS SURGERY  2012   Social History   Tobacco Use  . Smoking status: Never Smoker  . Smokeless tobacco: Never Used  Substance Use Topics  . Alcohol use: No  . Drug use: No   Family History  Problem Relation Age of Onset  . Cancer Mother 30       ovarian  . CAD Mother   . Cancer Father 48       brain  . CAD Father   . Cancer Daughter 18       skin  . Cancer Maternal Aunt 34       breast    Allergies:  Allergies  Allergen Reactions  . Contrast Media [Iodinated Diagnostic Agents] Shortness Of Breath  . Metrizamide Shortness Of Breath  . Penicillins Hives and Other (See Comments)    Other reaction(s): Other (See Comments) Has patient had a PCN reaction causing immediate rash, facial/tongue/throat swelling, SOB or lightheadedness with hypotension: Yes Has patient had a PCN reaction causing severe rash involving mucus membranes or skin necrosis: No Has patient had a PCN reaction that required hospitalization No Has patient had a PCN reaction occurring within the last  10 years: No If all of the above answers are "NO", then may proceed with Cephalosporin use. Has patient had a PCN reaction causing immediate rash, facial/tongue/throat swelling, SOB or lightheadedness with hypotension: Yes Has patient had a PCN reaction causing severe rash involving mucus membranes or skin necrosis: No Has patient had a PCN reaction that required hospitalization No Has patient had a PCN reaction occurring within the last 10 years: No If all of the above answers are "NO", then may proceed with Cephalosporin use.   . Isosorbide Nitrate     Other reaction(s): Headache  . Latex Hives  . Ondansetron Other (See Comments)    Severe headache  . Zofran [Ondansetron Hcl] Other (See Comments)    Causes migraines  . Povidone-Iodine Rash     Blistering rash  . Pulmicort [Budesonide] Itching    Current antibiotics: Antibiotics Given (last 72 hours)    Date/Time Action Medication Dose Rate   01/08/18 2158 New Bag/Given   metroNIDAZOLE (FLAGYL) IVPB 500 mg 500 mg 100 mL/hr   01/09/18 1124 Given   vancomycin (VANCOCIN) 50 mg/mL oral solution 125 mg 125 mg    01/09/18 1525 Given   fidaxomicin (DIFICID) tablet 200 mg 200 mg    01/09/18 2156 Given   fidaxomicin (DIFICID) tablet 200 mg 200 mg    01/10/18 0844 Given   fidaxomicin (DIFICID) tablet 200 mg 200 mg       MEDICATIONS: . budesonide (PULMICORT) nebulizer solution  0.25 mg Nebulization BID  . docusate sodium  100 mg Oral BID  . feeding supplement  1 Container Oral TID BM  . fidaxomicin  200 mg Oral BID  . loratadine  10 mg Oral Daily  . losartan  12.5 mg Oral BID  . metoprolol succinate  50 mg Oral Daily  . multivitamin with minerals  1 tablet Oral Daily  . pantoprazole (PROTONIX) IV  40 mg Intravenous Q12H  . rOPINIRole  2-4 mg Oral QHS  . spironolactone  50 mg Oral Daily  . vitamin B-12  500 mcg Oral Daily    Review of Systems - 11 systems reviewed and negative per HPI   OBJECTIVE: Temp:  [98.3 F (36.8 C)-98.4 F (36.9 C)] 98.3 F (36.8 C) (05/08 0431) Pulse Rate:  [84-104] 102 (05/08 0431) Resp:  [16-20] 20 (05/08 0431) BP: (110-132)/(62-88) 124/88 (05/08 0431) SpO2:  [94 %-96 %] 94 % (05/08 0431) Weight:  [99.9 kg (220 lb 3.8 oz)] 99.9 kg (220 lb 3.8 oz) (05/08 0431) Physical Exam  Constitutional:  oriented to person, place, and time. appears well-developed and well-nourished. No distress.  HENT: [place]/AT, PERRLA, no scleral icterus Mouth/Throat: Oropharynx is clear and moist. No oropharyngeal exudate.  Cardiovascular: Normal rate, regular rhythm and normal heart sounds. Exam reveals no gallop and no friction rub.  No murmur heard.  Pulmonary/Chest: Effort normal and breath sounds normal. No respiratory distress.  has no wheezes.  Neck = supple, no  nuchal rigidity Abdominal: Soft. Bowel sounds are normal.  exhibits no distension. There is no tenderness.  Lymphadenopathy: no cervical adenopathy. No axillary adenopathy Neurological: alert and oriented to person, place, and time.  Skin: Skin is warm and dry. No rash noted. No erythema.  Psychiatric: a normal mood and affect.  behavior is normal.    LABS: Results for orders placed or performed during the hospital encounter of 01/08/18 (from the past 48 hour(s))  Lipase, blood     Status: None   Collection Time: 01/08/18  6:59 PM  Result  Value Ref Range   Lipase 38 11 - 51 U/L    Comment: Performed at [facility], [address] [place], [place] [address]  Comprehensive metabolic panel     Status: Abnormal   Collection Time: 01/08/18  6:59 PM  Result Value Ref Range   Sodium 137 135 - 145 mmol/L   Potassium 2.5 (LL) 3.5 - 5.1 mmol/L    Comment: CRITICAL RESULT CALLED TO, READ BACK BY AND VERIFIED WITH RACQUEL Murrell Elizondo AT 1956 01/08/2018.  TFK    Chloride 92 (L) 101 - 111 mmol/L   CO2 33 (H) 22 - 32 mmol/L   Glucose, Bld 119 (H) 65 - 99 mg/dL   BUN 9 6 - 20 mg/dL   Creatinine, Ser 1.11 (H) 0.44 - 1.00 mg/dL   Calcium 9.6 8.9 - 10.3 mg/dL   Total Protein 7.8 6.5 - 8.1 g/dL   Albumin 3.6 3.5 - 5.0 g/dL   AST 26 15 - 41 U/L   ALT 17 14 - 54 U/L   Alkaline Phosphatase 104 38 - 126 U/L   Total Bilirubin 0.4 0.3 - 1.2 mg/dL   GFR calc non Af Amer 57 (L) >60 mL/min   GFR calc Af Amer >60 >60 mL/min    Comment: (NOTE) The eGFR has been calculated using the CKD EPI equation. This calculation has not been validated in all clinical situations. eGFR's persistently <60 mL/min signify possible Chronic Kidney Disease.    Anion gap 12 5 - 15    Comment: Performed at [facility], [address] [place], [place] [address]  CBC     Status: Abnormal   Collection Time: 01/08/18  6:59 PM  Result Value Ref Range   WBC 10.5 3.6 - 11.0 K/uL   RBC 4.30 3.80 - 5.20 MIL/uL    Hemoglobin 11.6 (L) 12.0 - 16.0 g/dL   HCT 33.7 (L) 35.0 - 47.0 %   MCV 78.3 (L) 80.0 - 100.0 fL   MCH 26.9 26.0 - 34.0 pg   MCHC 34.3 32.0 - 36.0 g/dL   RDW 17.6 (H) 11.5 - 14.5 %   Platelets 322 150 - 440 K/uL    Comment: Performed at [facility], [address] [place], [place] [address]  Type and screen [place]     Status: None   Collection Time: 01/08/18  7:00 PM  Result Value Ref Range   ABO/RH(D) O NEG    Antibody Screen NEG    Sample Expiration      01/11/2018 Performed at [facility], [address] [place], [place] [address]   Basic metabolic panel     Status: Abnormal   Collection Time: 01/09/18  5:36 AM  Result Value Ref Range   Sodium 140 135 - 145 mmol/L   Potassium 2.8 (L) 3.5 - 5.1 mmol/L   Chloride 97 (L) 101 - 111 mmol/L   CO2 34 (H) 22 - 32 mmol/L   Glucose, Bld 97 65 - 99 mg/dL   BUN 10 6 - 20 mg/dL   Creatinine, Ser 1.03 (H) 0.44 - 1.00 mg/dL   Calcium 8.7 (L) 8.9 - 10.3 mg/dL   GFR calc non Af Amer >60 >60 mL/min   GFR calc Af Amer >60 >60 mL/min    Comment: (NOTE) The eGFR has been calculated using the CKD EPI equation. This calculation has not been validated in all clinical situations. eGFR's persistently <60 mL/min signify possible Chronic Kidney Disease.    Anion  gap 9 5 - 15    Comment: Performed at [facility], [address] [place], [place] [address]  CBC     Status: Abnormal   Collection Time: 01/09/18  5:36 AM  Result Value Ref Range   WBC 8.9 3.6 - 11.0 K/uL   RBC 3.94 3.80 - 5.20 MIL/uL   Hemoglobin 10.5 (L) 12.0 - 16.0 g/dL   HCT 30.9 (L) 35.0 - 47.0 %   MCV 78.5 (L) 80.0 - 100.0 fL   MCH 26.7 26.0 - 34.0 pg   MCHC 34.0 32.0 - 36.0 g/dL   RDW 17.9 (H) 11.5 - 14.5 %   Platelets 279 150 - 440 K/uL    Comment: Performed at [facility], [address] [place], [place] [address]  Glucose, capillary     Status: None   Collection Time: 01/09/18  7:43 AM  Result  Value Ref Range   Glucose-Capillary 91 65 - 99 mg/dL   Comment 1 Notify RN   Potassium     Status: Abnormal   Collection Time: 01/09/18 11:25 AM  Result Value Ref Range   Potassium 3.4 (L) 3.5 - 5.1 mmol/L    Comment: Performed at [facility], [address], [place], [place] [address]  Magnesium     Status: None   Collection Time: 01/09/18 11:25 AM  Result Value Ref Range   Magnesium 2.1 1.7 - 2.4 mg/dL    Comment: Performed at [facility], [address] [place], [place] [address]  Urinalysis, Complete w Microscopic     Status: Abnormal   Collection Time: 01/09/18 12:30 PM  Result Value Ref Range   Color, Urine YELLOW (A) YELLOW   APPearance CLEAR (A) CLEAR   Specific Gravity, Urine 1.009 1.005 - 1.030   pH 5.0 5.0 - 8.0   Glucose, UA NEGATIVE NEGATIVE mg/dL   Hgb urine dipstick NEGATIVE NEGATIVE   Bilirubin Urine NEGATIVE NEGATIVE   Ketones, ur NEGATIVE NEGATIVE mg/dL   Protein, ur NEGATIVE NEGATIVE mg/dL   Nitrite NEGATIVE NEGATIVE   Leukocytes, UA TRACE (A) NEGATIVE   RBC / HPF 0-5 0 - 5 RBC/hpf   WBC, UA 0-5 0 - 5 WBC/hpf   Bacteria, UA RARE (A) NONE SEEN   Squamous Epithelial / LPF 0-5 0 - 5    Comment: Please note change in reference range. Performed at [facility], [address] [place], [place] [address]   Basic metabolic panel     Status: Abnormal   Collection Time: 01/10/18  5:21 AM  Result Value Ref Range   Sodium 142 135 - 145 mmol/L   Potassium 3.8 3.5 - 5.1 mmol/L   Chloride 105 101 - 111 mmol/L   CO2 30 22 - 32 mmol/L   Glucose, Bld 125 (H) 65 - 99 mg/dL   BUN <5 (L) 6 - 20 mg/dL   Creatinine, Ser 0.85 0.44 - 1.00 mg/dL   Calcium 8.8 (L) 8.9 - 10.3 mg/dL   GFR calc non Af Amer >60 >60 mL/min   GFR calc Af Amer >60 >60 mL/min    Comment: (NOTE) The eGFR has been calculated using the CKD EPI equation. This calculation has not been validated in all clinical situations. eGFR's persistently <60 mL/min signify possible  Chronic Kidney Disease.    Anion gap 7 5 - 15    Comment: Performed at [facility], [address] [place],  [address]  CBC     Status: Abnormal  Collection Time: 01/10/18  5:21 AM  Result Value Ref Range   WBC 8.0 3.6 - 11.0 K/uL   RBC 4.16 3.80 - 5.20 MIL/uL   Hemoglobin 11.1 (L) 12.0 - 16.0 g/dL   HCT 33.5 (L) 35.0 - 47.0 %   MCV 80.5 80.0 - 100.0 fL   MCH 26.7 26.0 - 34.0 pg   MCHC 33.2 32.0 - 36.0 g/dL   RDW 18.2 (H) 11.5 - 14.5 %   Platelets 284 150 - 440 K/uL    Comment: Performed at [facility], [address] [place], [place] [address]  Glucose, capillary     Status: Abnormal   Collection Time: 01/10/18  8:23 AM  Result Value Ref Range   Glucose-Capillary 124 (H) 65 - 99 mg/dL   Comment 1 Notify RN    No components found for: ESR, C REACTIVE PROTEIN MICRO: Recent Results (from the past 720 hour(s))  Calprotectin, Fecal     Status: Abnormal   Collection Time: 12/29/17 11:45 AM  Result Value Ref Range Status   Calprotectin, Fecal 320 (H) 0 - 120 ug/g Final    Comment: (NOTE) **Results verified by repeat testing** Concentration     Interpretation   Follow-Up <16 - 50 ug/g     Normal           None >50 -120 ug/g     Borderline       Re-evaluate in 4-6 weeks    >120 ug/g     Abnormal         Repeat as clinically                                   indicated Performed At: [facility] [place], [place] [id number] Rush Farmer MD RK:[id number] Performed at [facility], [address] [place], [place] [address]   C difficile quick scan w PCR reflex     Status: Abnormal   Collection Time: 01/05/18  9:45 AM  Result Value Ref Range Status   C Diff antigen POSITIVE (A) NEGATIVE Final   C Diff toxin NEGATIVE NEGATIVE Final   C Diff interpretation Results are indeterminate. See PCR results.  Final    Comment: Performed at [facility], [address] [place], [place] [address]  Gastrointestinal  Panel by PCR , Stool     Status: None   Collection Time: 01/05/18  9:45 AM  Result Value Ref Range Status   Campylobacter species NOT DETECTED NOT DETECTED Final   Plesimonas shigelloides NOT DETECTED NOT DETECTED Final   Salmonella species NOT DETECTED NOT DETECTED Final   Yersinia enterocolitica NOT DETECTED NOT DETECTED Final   Vibrio species NOT DETECTED NOT DETECTED Final   Vibrio cholerae NOT DETECTED NOT DETECTED Final   Enteroaggregative E coli (EAEC) NOT DETECTED NOT DETECTED Final   Enteropathogenic E coli (EPEC) NOT DETECTED NOT DETECTED Final   Enterotoxigenic E coli (ETEC) NOT DETECTED NOT DETECTED Final   Shiga like toxin producing E coli (STEC) NOT DETECTED NOT DETECTED Final   Shigella/Enteroinvasive E coli (EIEC) NOT DETECTED NOT DETECTED Final   Cryptosporidium NOT DETECTED NOT DETECTED Final   Cyclospora cayetanensis NOT DETECTED NOT DETECTED Final   Entamoeba histolytica NOT DETECTED NOT DETECTED Final   Giardia lamblia NOT DETECTED NOT DETECTED Final   Adenovirus F40/41 NOT DETECTED NOT DETECTED Final   Astrovirus NOT DETECTED NOT DETECTED Final  Norovirus GI/GII NOT DETECTED NOT DETECTED Final   Rotavirus A NOT DETECTED NOT DETECTED Final   Sapovirus (I, II, IV, and V) NOT DETECTED NOT DETECTED Final    Comment: Performed at [facility], [address], [place], [place] [address]  C. Diff by PCR, Reflexed     Status: Abnormal   Collection Time: 01/05/18  9:45 AM  Result Value Ref Range Status   Toxigenic C. Difficile by PCR POSITIVE (A) NEGATIVE Final    Comment: Positive for toxigenic C. difficile with little to no toxin production. Only treat if clinical presentation suggests symptomatic illness. Performed at [facility], [address] [place], [place] [address]     IMAGING: Ct Abdomen Pelvis Wo Contrast  Result Date: 01/09/2018 CLINICAL DATA:  52 year old female with history of diverticulitis and endometriosis presenting with lower  abdominal cramping and diarrhea with dark and bright red blood ongoing for the past 2 days. Followed for recurrent C difficile colitis. Stool positive for C difficile despite vancomycin treatment. Prior cholecystectomy, hysterectomy and colectomy. Initial encounter. EXAM: CT ABDOMEN AND PELVIS WITHOUT CONTRAST TECHNIQUE: Multidetector CT imaging of the abdomen and pelvis was performed following the standard protocol without IV contrast. COMPARISON:  02/01/2016 CT. FINDINGS: Lower chest: Stable left base 2.5 mm nodule. AICD leads in the region of the right atrium and right ventricle. Heart size top-normal. Hepatobiliary: Mild fatty infiltration of liver. Taking into account limitation by non contrast imaging, no focal worrisome hepatic lesion. Post cholecystectomy. No calcified common bile duct stone. Pancreas: Taking into account limitation by non contrast imaging, no worrisome pancreatic mass or infiltration. Spleen: Top normal size spleen. Taking into account limitation by non contrast imaging, no focal splenic lesion. Adrenals/Urinary Tract: No obstructing stone or hydronephrosis. Taking into account limitation by non contrast imaging, no worrisome renal adrenal or bladder lesion. Stomach/Bowel: Post sigmoid resection. No colonic inflammatory process identified. Scattered diverticula. Appendix not visualized and may been surgically removed. No small bowel abnormality noted. 2.4 cm fatty lesion suspected within the anterior inferior wall of the gastric body/antrum junction (series 2, image 33, series 6, image 77 and series 5, image 27). Vascular/Lymphatic: No aortic aneurysm.  No adenopathy. Reproductive: Post hysterectomy.  No adnexal mass noted. Other: Small fat containing hernia infraumbilical region with rent measuring 1.1 x 0.9 cm and herniated fat measuring up to 3.2 cm. No free intraperitoneal air. Musculoskeletal: No worrisome osseous abnormality. Mild degenerative changes most notable L5-S1. IMPRESSION:  Post sigmoid resection. No colonic inflammatory process identified. Scattered diverticula. 2.4 cm fatty lesion suspected within the anterior inferior wall of the gastric body/antrum junction (series 2, image 33, series 6, image 77 and series 5, image 27). Post hysterectomy and cholecystectomy. Small fat containing hernia infraumbilical region. AICD in place. Mild fatty infiltration liver. Electronically Signed   By: Genia Del M.D.   On: 01/09/2018 11:01    Assessment:   Shaunita Seney is a 52 y.o. female with recurrent C diff with current sxs of bloody stools, diarrhea and crampy abd pain. Wbc is only 10, no fevers, cr nml. CT negative for abd pathology of colitis. She has persistently + c diff test however the C diff test will remain positive for weeks to months after successful treatment and resolution of symptoms so would not necessary assume her sxs are all C diff. 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 Clinically responding to just one day of fidaxomicin.   Recommendations Per C  Diff guidelines would treat her "2nd" recurrence with fidaxomicin 200 BID for 10 days. Proceed with further wu per GI  Thank you very much for allowing me to participate in the care of this patient. Please call with questions.   Cheral Marker. Ola Spurr, MD

## 2018-01-10 NOTE — Progress Notes (Signed)
[place] at [facility]                                                                                                                                                                                  Patient Demographics   Brittany Nguyen, is a 52 y.o. female, DOB - April 05, 1966, CVE:[id number]  Admit date - 01/08/2018   Admitting Physician Amelia Jo, MD  Outpatient Primary MD for the patient is Brittany Pink, MD   LOS - 2  Subjective: Continues to have diarrhea but feeling much better  Review of Systems:   CONSTITUTIONAL: No documented fever. No fatigue, weakness. No weight gain, no weight loss.  EYES: No blurry or double vision.  ENT: No tinnitus. No postnasal drip. No redness of the oropharynx.  RESPIRATORY: No cough, no wheeze, no hemoptysis. No dyspnea.  CARDIOVASCULAR: No chest pain. No orthopnea. No palpitations. No syncope.  GASTROINTESTINAL: No nausea, no vomiting or positive diarrhea. No abdominal pain. No melena or hematochezia.  GENITOURINARY: No dysuria or hematuria.  ENDOCRINE: No polyuria or nocturia. No heat or cold intolerance.  HEMATOLOGY: No anemia. No bruising. No bleeding.  INTEGUMENTARY: No rashes. No lesions.  MUSCULOSKELETAL: No arthritis. No swelling. No gout.  NEUROLOGIC: No numbness, tingling, or ataxia. No seizure-type activity.  PSYCHIATRIC: No anxiety. No insomnia. No ADD.    Vitals:   Vitals:   01/09/18 2044 01/09/18 2046 01/10/18 0431 01/10/18 1319  BP: 132/83  124/88 113/73  Pulse: (!) 104  (!) 102 (!) 58  Resp: 20  20 (!) 23  Temp: 98.4 F (36.9 C)  98.3 F (36.8 C) 98.3 F (36.8 C)  TempSrc: Oral  Oral   SpO2: 94% 94% 94% 97%  Weight:   99.9 kg (220 lb 3.8 oz)   Height:        Wt Readings from Last 3 Encounters:  01/10/18 99.9 kg (220 lb 3.8 oz)  11/01/17 104.9 kg (231 lb 3.2 oz)  09/27/17 103.6 kg (228 lb 8 oz)     Intake/Output Summary (Last 24 hours) at 01/10/2018 1407 Last data filed at 01/10/2018  0900 Gross per 24 hour  Intake 1980 ml  Output 1550 ml  Net 430 ml    Physical Exam:   GENERAL: Pleasant-appearing in no apparent distress.  HEAD, EYES, EARS, NOSE AND THROAT: Atraumatic, normocephalic. Extraocular muscles are intact. Pupils equal and reactive to light. Sclerae anicteric. No conjunctival injection. No oro-pharyngeal erythema.  NECK: Supple. There is no jugular venous distention. No bruits, no lymphadenopathy, no thyromegaly.  HEART: Regular rate and rhythm,. No murmurs, no rubs, no clicks.  LUNGS: Clear to auscultation bilaterally. No rales or rhonchi. No wheezes.  ABDOMEN: Soft, flat, nontender, nondistended. Has good bowel sounds. No hepatosplenomegaly appreciated.  EXTREMITIES: No evidence of any cyanosis, clubbing, or peripheral edema.  +2 pedal and radial pulses bilaterally.  NEUROLOGIC: The patient is alert, awake, and oriented x3 with no focal motor or sensory deficits appreciated bilaterally.  SKIN: Moist and warm with no rashes appreciated.  Psych: Not anxious, depressed LN: No inguinal LN enlargement    Antibiotics   Anti-infectives (From admission, onward)   Start     Dose/Rate Route Frequency Ordered Stop   01/09/18 1500  fidaxomicin (DIFICID) tablet 200 mg     200 mg Oral 2 times daily 01/09/18 1459 01/19/18 0959   01/09/18 0000  vancomycin (VANCOCIN) 50 mg/mL oral solution 125 mg  Status:  Discontinued     125 mg Oral 4 times daily 01/08/18 2351 01/09/18 1459   01/08/18 2130  metroNIDAZOLE (FLAGYL) IVPB 500 mg     500 mg 100 mL/hr over 60 Minutes Intravenous  Once 01/08/18 2125 01/08/18 2249      Medications   Scheduled Meds: . budesonide (PULMICORT) nebulizer solution  0.25 mg Nebulization BID  . docusate sodium  100 mg Oral BID  . feeding supplement  1 Container Oral TID BM  . fidaxomicin  200 mg Oral BID  . loratadine  10 mg Oral Daily  . losartan  12.5 mg Oral BID  . metoprolol succinate  50 mg Oral Daily  . multivitamin with minerals   1 tablet Oral Daily  . pantoprazole (PROTONIX) IV  40 mg Intravenous Q12H  . rOPINIRole  2-4 mg Oral QHS  . spironolactone  50 mg Oral Daily  . vitamin B-12  500 mcg Oral Daily   Continuous Infusions:  PRN Meds:.acetaminophen **OR** acetaminophen, bisacodyl, dicyclomine, HYDROcodone-acetaminophen, HYDROmorphone (DILAUDID) injection, nitroGLYCERIN, prochlorperazine, promethazine, traMADol, traZODone   Data Review:   Micro Results Recent Results (from the past 240 hour(s))  C difficile quick scan w PCR reflex     Status: Abnormal   Collection Time: 01/05/18  9:45 AM  Result Value Ref Range Status   C Diff antigen POSITIVE (A) NEGATIVE Final   C Diff toxin NEGATIVE NEGATIVE Final   C Diff interpretation Results are indeterminate. See PCR results.  Final    Comment: Performed at [facility], [address] [place], [place] [address]  Gastrointestinal Panel by PCR , Stool     Status: None   Collection Time: 01/05/18  9:45 AM  Result Value Ref Range Status   Campylobacter species NOT DETECTED NOT DETECTED Final   Plesimonas shigelloides NOT DETECTED NOT DETECTED Final   Salmonella species NOT DETECTED NOT DETECTED Final   Yersinia enterocolitica NOT DETECTED NOT DETECTED Final   Vibrio species NOT DETECTED NOT DETECTED Final   Vibrio cholerae NOT DETECTED NOT DETECTED Final   Enteroaggregative E coli (EAEC) NOT DETECTED NOT DETECTED Final   Enteropathogenic E coli (EPEC) NOT DETECTED NOT DETECTED Final   Enterotoxigenic E coli (ETEC) NOT DETECTED NOT DETECTED Final   Shiga like toxin producing E coli (STEC) NOT DETECTED NOT DETECTED Final   Shigella/Enteroinvasive E coli (EIEC) NOT DETECTED NOT DETECTED Final   Cryptosporidium NOT DETECTED NOT DETECTED Final   Cyclospora cayetanensis NOT DETECTED NOT DETECTED Final   Entamoeba histolytica NOT DETECTED NOT DETECTED Final   Giardia lamblia NOT DETECTED NOT DETECTED Final   Adenovirus F40/41 NOT DETECTED NOT DETECTED Final    Astrovirus NOT DETECTED NOT DETECTED Final   Norovirus GI/GII NOT DETECTED NOT DETECTED  Final   Rotavirus A NOT DETECTED NOT DETECTED Final   Sapovirus (I, II, IV, and V) NOT DETECTED NOT DETECTED Final    Comment: Performed at [facility], [address] [place], [place] [address]  C. Diff by PCR, Reflexed     Status: Abnormal   Collection Time: 01/05/18  9:45 AM  Result Value Ref Range Status   Toxigenic C. Difficile by PCR POSITIVE (A) NEGATIVE Final    Comment: Positive for toxigenic C. difficile with little to no toxin production. Only treat if clinical presentation suggests symptomatic illness. Performed at [facility], [address] [place], [place] [address]     Radiology Reports Ct Abdomen Pelvis Wo Contrast  Result Date: 01/09/2018 CLINICAL DATA:  52 year old female with history of diverticulitis and endometriosis presenting with lower abdominal cramping and diarrhea with dark and bright red blood ongoing for the past 2 days. Followed for recurrent C difficile colitis. Stool positive for C difficile despite vancomycin treatment. Prior cholecystectomy, hysterectomy and colectomy. Initial encounter. EXAM: CT ABDOMEN AND PELVIS WITHOUT CONTRAST TECHNIQUE: Multidetector CT imaging of the abdomen and pelvis was performed following the standard protocol without IV contrast. COMPARISON:  02/01/2016 CT. FINDINGS: Lower chest: Stable left base 2.5 mm nodule. AICD leads in the region of the right atrium and right ventricle. Heart size top-normal. Hepatobiliary: Mild fatty infiltration of liver. Taking into account limitation by non contrast imaging, no focal worrisome hepatic lesion. Post cholecystectomy. No calcified common bile duct stone. Pancreas: Taking into account limitation by non contrast imaging, no worrisome pancreatic mass or infiltration. Spleen: Top normal size spleen. Taking into account limitation by non contrast imaging, no focal splenic lesion.  Adrenals/Urinary Tract: No obstructing stone or hydronephrosis. Taking into account limitation by non contrast imaging, no worrisome renal adrenal or bladder lesion. Stomach/Bowel: Post sigmoid resection. No colonic inflammatory process identified. Scattered diverticula. Appendix not visualized and may been surgically removed. No small bowel abnormality noted. 2.4 cm fatty lesion suspected within the anterior inferior wall of the gastric body/antrum junction (series 2, image 33, series 6, image 77 and series 5, image 27). Vascular/Lymphatic: No aortic aneurysm.  No adenopathy. Reproductive: Post hysterectomy.  No adnexal mass noted. Other: Small fat containing hernia infraumbilical region with rent measuring 1.1 x 0.9 cm and herniated fat measuring up to 3.2 cm. No free intraperitoneal air. Musculoskeletal: No worrisome osseous abnormality. Mild degenerative changes most notable L5-S1. IMPRESSION: Post sigmoid resection. No colonic inflammatory process identified. Scattered diverticula. 2.4 cm fatty lesion suspected within the anterior inferior wall of the gastric body/antrum junction (series 2, image 33, series 6, image 77 and series 5, image 27). Post hysterectomy and cholecystectomy. Small fat containing hernia infraumbilical region. AICD in place. Mild fatty infiltration liver. Electronically Signed   By: Genia Del M.D.   On: 01/09/2018 11:01     CBC Recent Labs  Lab 01/08/18 1859 01/09/18 0536 01/10/18 0521  WBC 10.5 8.9 8.0  HGB 11.6* 10.5* 11.1*  HCT 33.7* 30.9* 33.5*  PLT 322 279 284  MCV 78.3* 78.5* 80.5  MCH 26.9 26.7 26.7  MCHC 34.3 34.0 33.2  RDW 17.6* 17.9* 18.2*    Chemistries  Recent Labs  Lab 01/08/18 1859 01/09/18 0536 01/09/18 1125 01/10/18 0521  NA 137 140  --  142  K 2.5* 2.8* 3.4* 3.8  CL 92* 97*  --  105  CO2 33* 34*  --  30  GLUCOSE 119* 97  --  125*  BUN 9 10  --  <  5*  CREATININE 1.11* 1.03*  --  0.85  CALCIUM 9.6 8.7*  --  8.8*  MG  --   --  2.1  --    AST 26  --   --   --   ALT 17  --   --   --   ALKPHOS 104  --   --   --   BILITOT 0.4  --   --   --    ------------------------------------------------------------------------------------------------------------------ estimated creatinine clearance is 93.8 mL/min (by C-G formula based on SCr of 0.85 mg/dL). ------------------------------------------------------------------------------------------------------------------ No results for input(s): HGBA1C in the last 72 hours. ------------------------------------------------------------------------------------------------------------------ No results for input(s): CHOL, HDL, LDLCALC, TRIG, CHOLHDL, LDLDIRECT in the last 72 hours. ------------------------------------------------------------------------------------------------------------------ No results for input(s): TSH, T4TOTAL, T3FREE, THYROIDAB in the last 72 hours.  Invalid input(s): FREET3 ------------------------------------------------------------------------------------------------------------------ No results for input(s): VITAMINB12, FOLATE, FERRITIN, TIBC, IRON, RETICCTPCT in the last 72 hours.  Coagulation profile No results for input(s): INR, PROTIME in the last 168 hours.  No results for input(s): DDIMER in the last 72 hours.  Cardiac Enzymes No results for input(s): CKMB, TROPONINI, MYOGLOBIN in the last 168 hours.  Invalid input(s): CK ------------------------------------------------------------------------------------------------------------------ Invalid input(s): [place]  Patient is 52 year old with diarrhea  1.   Acute GI bleed, with hematochezia and melena.    Likely related to diarrhea and possible internal hemorrhoids, hemoglobin stable 2.  Recurrent C. difficile colitis.    This will be her third episode continue Dificid ID evaluation pending 3.  Hypokalemia, secondary to diarrhea.    Potassium replaced  4.  Acute renal failure, likely  prerenal, secondary to GI losses.    Resolved 5.  Miscellaneous SCDs for DVT prophylaxis       Code Status Orders  (From admission, onward)        Start     Ordered   01/08/18 2351  Full code  Continuous     01/08/18 2351    Code Status History    Date Active Date Inactive Code Status Order ID Comments User Context   10/30/2017 0113 11/01/2017 1421 Full Code [id number]  Lance Coon, MD ED   09/24/2017 0848 09/27/2017 1409 Full Code [id number]  Loletha Grayer, MD ED   07/03/2017 0654 07/05/2017 1248 Full Code [id number]  Saundra Shelling, MD ED   04/25/2017 0146 04/26/2017 1516 Full Code [id number]  Hugelmeyer, Alexis, DO ED   01/28/2017 1033 01/30/2017 1741 Full Code [id number]  [name], MD Inpatient   01/22/2017 0114 01/24/2017 1759 Full Code [id number]  Lance Coon, MD Inpatient   12/09/2016 0209 12/09/2016 1507 Full Code [id number]  Lance Coon, MD Inpatient   11/21/2015 0650 11/22/2015 1550 Full Code [id number]  Saundra Shelling, MD Inpatient   09/19/2015 0511 09/21/2015 1829 Full Code [id number]  Harrie Foreman, MD Inpatient           Consults gastroenterology and GI  DVT Prophylaxis SCDs  Lab Results  Component Value Date   PLT 284 01/10/2018     Time Spent in minutes   35 minutes greater than 50% of time spent in care coordination and counseling patient regarding the condition and plan of care.   Dustin Flock M.D on 01/10/2018 at 2:07 PM  Between 7am to 6pm - Pager - [phone]  After 6pm go to [email or web address] - Proofreader  [employer]   Office  [phone]

## 2018-01-10 NOTE — Progress Notes (Signed)
Jonathon Bellows , MD [address], [place], [place], [place], [address] 3940 [address], [place], [place], [place], [address] Phone: [phone]  Fax: [phone]   Brittany Nguyen is being followed for recurrent C diff  Day 1 of follow up   Subjective: Diarrhea much better, on dificid, stools more formed, no abdominal pain. , no rectal bleeding    Objective: Vital signs in last 24 hours: Vitals:   01/09/18 2044 01/09/18 2046 01/10/18 0431 01/10/18 1319  BP: 132/83  124/88 113/73  Pulse: (!) 104  (!) 102 (!) 58  Resp: 20  20 (!) 23  Temp: 98.4 F (36.9 C)  98.3 F (36.8 C) 98.3 F (36.8 C)  TempSrc: Oral  Oral   SpO2: 94% 94% 94% 97%  Weight:   220 lb 3.8 oz (99.9 kg)   Height:       Weight change: -4 lb 12.2 oz (-2.159 kg)  Intake/Output Summary (Last 24 hours) at 01/10/2018 1626 Last data filed at 01/10/2018 1428 Gross per 24 hour  Intake 1980 ml  Output 1750 ml  Net 230 ml     Exam: Heart:: Regular rate and rhythm, S1S2 present or without murmur or extra heart sounds Lungs: normal, clear to auscultation and clear to auscultation and percussion Abdomen: soft, nontender, normal bowel sounds   Lab Results: @LABTEST2 @ Micro Results: Recent Results (from the past 240 hour(s))  C difficile quick scan w PCR reflex     Status: Abnormal   Collection Time: 01/05/18  9:45 AM  Result Value Ref Range Status   C Diff antigen POSITIVE (A) NEGATIVE Final   C Diff toxin NEGATIVE NEGATIVE Final   C Diff interpretation Results are indeterminate. See PCR results.  Final    Comment: Performed at [place] [facility], [address] [place], [place] [address]  Gastrointestinal Panel by PCR , Stool     Status: None   Collection Time: 01/05/18  9:45 AM  Result Value Ref Range Status   Campylobacter species NOT DETECTED NOT DETECTED Final   Plesimonas shigelloides NOT DETECTED NOT DETECTED Final   Salmonella species NOT DETECTED NOT DETECTED Final   Yersinia  enterocolitica NOT DETECTED NOT DETECTED Final   Vibrio species NOT DETECTED NOT DETECTED Final   Vibrio cholerae NOT DETECTED NOT DETECTED Final   Enteroaggregative E coli (EAEC) NOT DETECTED NOT DETECTED Final   Enteropathogenic E coli (EPEC) NOT DETECTED NOT DETECTED Final   Enterotoxigenic E coli (ETEC) NOT DETECTED NOT DETECTED Final   Shiga like toxin producing E coli (STEC) NOT DETECTED NOT DETECTED Final   Shigella/Enteroinvasive E coli (EIEC) NOT DETECTED NOT DETECTED Final   Cryptosporidium NOT DETECTED NOT DETECTED Final   Cyclospora cayetanensis NOT DETECTED NOT DETECTED Final   Entamoeba histolytica NOT DETECTED NOT DETECTED Final   Giardia lamblia NOT DETECTED NOT DETECTED Final   Adenovirus F40/41 NOT DETECTED NOT DETECTED Final   Astrovirus NOT DETECTED NOT DETECTED Final   Norovirus GI/GII NOT DETECTED NOT DETECTED Final   Rotavirus A NOT DETECTED NOT DETECTED Final   Sapovirus (I, II, IV, and V) NOT DETECTED NOT DETECTED Final    Comment: Performed at [facility], [address] [place], [place] [address]  C. Diff by PCR, Reflexed     Status: Abnormal   Collection Time: 01/05/18  9:45 AM  Result Value Ref Range Status   Toxigenic C. Difficile by PCR POSITIVE (A) NEGATIVE Final    Comment: Positive for toxigenic C. difficile  with little to no toxin production. Only treat if clinical presentation suggests symptomatic illness. Performed at [facility], [place], [place] [address]    Studies/Results: Ct Abdomen Pelvis Wo Contrast  Result Date: 01/09/2018 CLINICAL DATA:  52 year old female with history of diverticulitis and endometriosis presenting with lower abdominal cramping and diarrhea with dark and bright red blood ongoing for the past 2 days. Followed for recurrent C difficile colitis. Stool positive for C difficile despite vancomycin treatment. Prior cholecystectomy, hysterectomy and colectomy. Initial encounter. EXAM: CT ABDOMEN  AND PELVIS WITHOUT CONTRAST TECHNIQUE: Multidetector CT imaging of the abdomen and pelvis was performed following the standard protocol without IV contrast. COMPARISON:  02/01/2016 CT. FINDINGS: Lower chest: Stable left base 2.5 mm nodule. AICD leads in the region of the right atrium and right ventricle. Heart size top-normal. Hepatobiliary: Mild fatty infiltration of liver. Taking into account limitation by non contrast imaging, no focal worrisome hepatic lesion. Post cholecystectomy. No calcified common bile duct stone. Pancreas: Taking into account limitation by non contrast imaging, no worrisome pancreatic mass or infiltration. Spleen: Top normal size spleen. Taking into account limitation by non contrast imaging, no focal splenic lesion. Adrenals/Urinary Tract: No obstructing stone or hydronephrosis. Taking into account limitation by non contrast imaging, no worrisome renal adrenal or bladder lesion. Stomach/Bowel: Post sigmoid resection. No colonic inflammatory process identified. Scattered diverticula. Appendix not visualized and may been surgically removed. No small bowel abnormality noted. 2.4 cm fatty lesion suspected within the anterior inferior wall of the gastric body/antrum junction (series 2, image 33, series 6, image 77 and series 5, image 27). Vascular/Lymphatic: No aortic aneurysm.  No adenopathy. Reproductive: Post hysterectomy.  No adnexal mass noted. Other: Small fat containing hernia infraumbilical region with rent measuring 1.1 x 0.9 cm and herniated fat measuring up to 3.2 cm. No free intraperitoneal air. Musculoskeletal: No worrisome osseous abnormality. Mild degenerative changes most notable L5-S1. IMPRESSION: Post sigmoid resection. No colonic inflammatory process identified. Scattered diverticula. 2.4 cm fatty lesion suspected within the anterior inferior wall of the gastric body/antrum junction (series 2, image 33, series 6, image 77 and series 5, image 27). Post hysterectomy and  cholecystectomy. Small fat containing hernia infraumbilical region. AICD in place. Mild fatty infiltration liver. Electronically Signed   By: Genia Del M.D.   On: 01/09/2018 11:01   Medications: I have reviewed the patient's current medications. Scheduled Meds: . budesonide (PULMICORT) nebulizer solution  0.25 mg Nebulization BID  . docusate sodium  100 mg Oral BID  . feeding supplement  1 Container Oral TID BM  . fidaxomicin  200 mg Oral BID  . loratadine  10 mg Oral Daily  . losartan  12.5 mg Oral BID  . metoprolol succinate  50 mg Oral Daily  . multivitamin with minerals  1 tablet Oral Daily  . pantoprazole (PROTONIX) IV  40 mg Intravenous Q12H  . rOPINIRole  2-4 mg Oral QHS  . spironolactone  50 mg Oral Daily  . vitamin B-12  500 mcg Oral Daily   Continuous Infusions: PRN Meds:.acetaminophen **OR** acetaminophen, bisacodyl, dicyclomine, HYDROcodone-acetaminophen, HYDROmorphone (DILAUDID) injection, nitroGLYCERIN, prochlorperazine, promethazine, traMADol, traZODone   Assessment: Active Problems:   GIB (gastrointestinal bleeding)   Brittany Nguyen is a 52 y.o. y/o female with H/o recurrent C diff diarrhea. Admitted to the hospital with abdominal pain and diarrhea. Some rectal bleeding. Labs suggest a microcytic anemia indicating that the blood loss has been ongoing chronically rather than just  acute.She has had microcytosis 3  months back . The question is if she is having a second process such as ulcerative colitis in addition.   Plan  1. Check iron studies, b12,folate and if low replace 2. Continue Dificid, suggest slow taper rather than a short 14 day course due to recurrent C diff  3.  I did discuss with her that she may very likely need a stool transplant.  4. Once her C diff is resolved she will need evaluation for her microcytic anemia with EGD+colonoscopy +/- capsule study of the small bowel.  5. Outpatient follow up in GI clinic   I will sign off.  Please call me  if any further GI concerns or questions.  We would like to thank you for the opportunity to participate in the care of Brittany Nguyen.    LOS: 2 days   Jonathon Bellows, MD 01/10/2018, 4:26 PM

## 2018-01-10 NOTE — Care Management (Signed)
ID consult pending. Dificid will likely need prior auth.  Notified MD that I will need script in order to initiate prior authorization.

## 2018-01-11 LAB — BASIC METABOLIC PANEL
ANION GAP: 8 (ref 5–15)
BUN: 8 mg/dL (ref 6–20)
CHLORIDE: 106 mmol/L (ref 101–111)
CO2: 27 mmol/L (ref 22–32)
Calcium: 8.4 mg/dL — ABNORMAL LOW (ref 8.9–10.3)
Creatinine, Ser: 0.81 mg/dL (ref 0.44–1.00)
GFR calc Af Amer: >60 mL/min (ref >60)
GFR calc non Af Amer: >60 mL/min (ref >60)
GLUCOSE: 112 mg/dL — AB (ref 65–99)
POTASSIUM: 3.3 mmol/L — AB (ref 3.5–5.1)
Sodium: 141 mmol/L (ref 135–145)

## 2018-01-11 LAB — GLUCOSE, CAPILLARY: GLUCOSE-CAPILLARY: 100 mg/dL — AB (ref 65–99)

## 2018-01-11 LAB — HEMOGLOBIN: Hemoglobin: 10.6 g/dL — ABNORMAL LOW (ref 12.0–16.0)

## 2018-01-11 LAB — PHOSPHORUS: Phosphorus: 4.6 mg/dL (ref 2.5–4.6)

## 2018-01-11 LAB — MAGNESIUM: MAGNESIUM: 2 mg/dL (ref 1.7–2.4)

## 2018-01-11 MED ORDER — FIDAXOMICIN 200 MG PO TABS: 200.0000 mg | ORAL_TABLET | Freq: Two times a day (BID) | ORAL | Status: AC

## 2018-01-11 MED ORDER — PROMETHAZINE HCL 6.25 MG/5ML PO SYRP: 12.5000 mg | ORAL_SOLUTION | Freq: Four times a day (QID) | ORAL | 0 refills | Status: DC | PRN

## 2018-01-11 MED ORDER — HYDROCODONE-ACETAMINOPHEN 5-325 MG PO TABS: 1.0000 | ORAL_TABLET | ORAL | 0 refills | Status: DC | PRN

## 2018-01-11 MED ORDER — ENSURE ENLIVE PO LIQD
237.0000 mL | Freq: Two times a day (BID) | ORAL | Status: DC
  Administered 2018-01-11 (×2): 237 mL via ORAL

## 2018-01-11 MED ORDER — POTASSIUM CHLORIDE 10 MEQ/100ML IV SOLN
10.0000 meq | INTRAVENOUS | Status: AC
  Administered 2018-01-11 (×3): 10 meq via INTRAVENOUS
  Filled 2018-01-11 (×3): qty 100

## 2018-01-11 NOTE — Progress Notes (Signed)
IV and tele were removed. Discharge instructions, follow-up appointments, and prescriptions were provided to the pt. All questions answered. The pt was taken downstairs via wheelchair by volunteers.

## 2018-01-11 NOTE — Care Management (Signed)
Patient to discharge on 10 day course of Dificid.  RNCM confirmed with patient and spouse that they have 20 capsules of Dificid at home that will complete a 10 day course. RNCM signing off

## 2018-01-11 NOTE — Discharge Summary (Signed)
[name] at [facility], 52 y.o., DOB Nov 15, 1965, MRN [id number]. Admission date: 01/08/2018 Discharge Date 01/11/2018 Primary MD [name], MD Admitting Physician Amelia Jo, MD  Admission Diagnosis  Hypokalemia [E87.6] Gastrointestinal hemorrhage, unspecified gastrointestinal hemorrhage type [K92.2]  Discharge Diagnosis   Active Problems: Acute GI bleed related to C. difficile colitis Recurrent C. difficile colitis Hypokalemia due to diarrhea Acute renal failure due to dehydration now    Brittany Nguyen  is a 52 y.o. female with a known history of diverticulitis, endometriosis, asthma and CHF. Patient presented to emergency room for severe lower abdominal cramps and diarrhea with dark and bright red blood, going on for the past 2 days.  Nausea is associated, no vomiting.  Patient has had recurrent C. difficile colitis.  And has been treated outpatient with vancomycin.  However she continued to have diarrhea.  Therefore we will was seen in the ER for above symptoms.  Patient was admitted repeat C. difficile check shows she had recurrent C. difficile.  Patient was changed over to D[facility].  She was seen in consultation by infectious disease and GI.  Who recommended current management.  Patient will need outpatient follow-up with GI to consider possible fecal stool transplantation.              Consults  GI  Significant Tests:  See full reports for all details    Ct Abdomen Pelvis Wo Contrast  Result Date: 01/09/2018 CLINICAL DATA:  52 year old female with history of diverticulitis and endometriosis presenting with lower abdominal cramping and diarrhea with dark and bright red blood ongoing for the past 2 days. Followed for recurrent C difficile colitis. Stool positive for C difficile despite vancomycin treatment. Prior cholecystectomy, hysterectomy and colectomy. Initial encounter. EXAM: CT ABDOMEN AND PELVIS  WITHOUT CONTRAST TECHNIQUE: Multidetector CT imaging of the abdomen and pelvis was performed following the standard protocol without IV contrast. COMPARISON:  02/01/2016 CT. FINDINGS: Lower chest: Stable left base 2.5 mm nodule. AICD leads in the region of the right atrium and right ventricle. Heart size top-normal. Hepatobiliary: Mild fatty infiltration of liver. Taking into account limitation by non contrast imaging, no focal worrisome hepatic lesion. Post cholecystectomy. No calcified common bile duct stone. Pancreas: Taking into account limitation by non contrast imaging, no worrisome pancreatic mass or infiltration. Spleen: Top normal size spleen. Taking into account limitation by non contrast imaging, no focal splenic lesion. Adrenals/Urinary Tract: No obstructing stone or hydronephrosis. Taking into account limitation by non contrast imaging, no worrisome renal adrenal or bladder lesion. Stomach/Bowel: Post sigmoid resection. No colonic inflammatory process identified. Scattered diverticula. Appendix not visualized and may been surgically removed. No small bowel abnormality noted. 2.4 cm fatty lesion suspected within the anterior inferior wall of the gastric body/antrum junction (series 2, image 33, series 6, image 77 and series 5, image 27). Vascular/Lymphatic: No aortic aneurysm.  No adenopathy. Reproductive: Post hysterectomy.  No adnexal mass noted. Other: Small fat containing hernia infraumbilical region with rent measuring 1.1 x 0.9 cm and herniated fat measuring up to 3.2 cm. No free intraperitoneal air. Musculoskeletal: No worrisome osseous abnormality. Mild degenerative changes most notable L5-S1. IMPRESSION: Post sigmoid resection. No colonic inflammatory process identified. Scattered diverticula. 2.4 cm fatty lesion suspected within the anterior inferior wall of the gastric body/antrum junction (series 2, image 33, series 6, image 77 and series 5, image 27). Post hysterectomy and cholecystectomy.  Small fat containing hernia infraumbilical region. AICD in place. Mild  fatty infiltration liver. Electronically Signed   By: Genia Del M.D.   On: 01/09/2018 11:01       Today   Subjective:   Brittany Nguyen patient feeling better diarrhea is resolved has semisolid stools today Objective:   Blood pressure 107/67, pulse (!) 101, temperature 98 F (36.7 C), temperature source Oral, resp. rate 16, height 5\' 5"  (1.651 m), weight 100.2 kg (220 lb 14.4 oz), last menstrual period 01/22/2012, SpO2 96 %.  .  Intake/Output Summary (Last 24 hours) at 01/11/2018 1449 Last data filed at 01/11/2018 0400 Gross per 24 hour  Intake 240 ml  Output 600 ml  Net -360 ml    Exam VITAL SIGNS: Blood pressure 107/67, pulse (!) 101, temperature 98 F (36.7 C), temperature source Oral, resp. rate 16, height 5\' 5"  (1.651 m), weight 100.2 kg (220 lb 14.4 oz), last menstrual period 01/22/2012, SpO2 96 %.  GENERAL:  52 y.o.-year-old patient lying in the bed with no acute distress.  EYES: Pupils equal, round, reactive to light and accommodation. No scleral icterus. Extraocular muscles intact.  HEENT: Head atraumatic, normocephalic. Oropharynx and nasopharynx clear.  NECK:  Supple, no jugular venous distention. No thyroid enlargement, no tenderness.  LUNGS: Normal breath sounds bilaterally, no wheezing, rales,rhonchi or crepitation. No use of accessory muscles of respiration.  CARDIOVASCULAR: S1, S2 normal. No murmurs, rubs, or gallops.  ABDOMEN: Soft, nontender, nondistended. Bowel sounds present. No organomegaly or mass.  EXTREMITIES: No pedal edema, cyanosis, or clubbing.  NEUROLOGIC: Cranial nerves II through XII are intact. Muscle strength 5/5 in all extremities. Sensation intact. Gait not checked.  PSYCHIATRIC: The patient is alert and oriented x 3.  SKIN: No obvious rash, lesion, or ulcer.   Data Review     CBC w Diff:  Lab Results  Component Value Date   WBC 8.0 01/10/2018   HGB 10.6 (L) 01/11/2018    HGB 11.7 (L) 10/03/2014   HCT 33.5 (L) 01/10/2018   HCT 34.9 (L) 10/03/2014   PLT 284 01/10/2018   PLT 273 10/03/2014   LYMPHOPCT 10 10/29/2017   LYMPHOPCT 17.8 04/16/2014   MONOPCT 9 10/29/2017   MONOPCT 6.2 04/16/2014   EOSPCT 1 10/29/2017   EOSPCT 3.3 04/16/2014   BASOPCT 1 10/29/2017   BASOPCT 0.4 04/16/2014   CMP:  Lab Results  Component Value Date   NA 141 01/11/2018   NA 139 10/03/2014   K 3.3 (L) 01/11/2018   K 3.9 10/03/2014   CL 106 01/11/2018   CL 106 10/03/2014   CO2 27 01/11/2018   CO2 28 10/03/2014   BUN 8 01/11/2018   BUN 11 10/03/2014   CREATININE 0.81 01/11/2018   CREATININE 1.03 10/03/2014   PROT 7.8 01/08/2018   PROT 7.5 12/14/2012   ALBUMIN 3.6 01/08/2018   ALBUMIN 3.4 12/14/2012   BILITOT 0.4 01/08/2018   BILITOT 0.1 (L) 12/14/2012   ALKPHOS 104 01/08/2018   ALKPHOS 126 12/14/2012   AST 26 01/08/2018   AST 19 12/14/2012   ALT 17 01/08/2018   ALT 25 12/14/2012  .  Micro Results Recent Results (from the past 240 hour(s))  C difficile quick scan w PCR reflex     Status: Abnormal   Collection Time: 01/05/18  9:45 AM  Result Value Ref Range Status   C Diff antigen POSITIVE (A) NEGATIVE Final   C Diff toxin NEGATIVE NEGATIVE Final   C Diff interpretation Results are indeterminate. See PCR results.  Final    Comment: Performed at [facility]  [facility], [address] [place], [place] [address]  Gastrointestinal Panel by PCR , Stool     Status: None   Collection Time: 01/05/18  9:45 AM  Result Value Ref Range Status   Campylobacter species NOT DETECTED NOT DETECTED Final   Plesimonas shigelloides NOT DETECTED NOT DETECTED Final   Salmonella species NOT DETECTED NOT DETECTED Final   Yersinia enterocolitica NOT DETECTED NOT DETECTED Final   Vibrio species NOT DETECTED NOT DETECTED Final   Vibrio cholerae NOT DETECTED NOT DETECTED Final   Enteroaggregative E coli (EAEC) NOT DETECTED NOT DETECTED Final   Enteropathogenic E coli (EPEC) NOT  DETECTED NOT DETECTED Final   Enterotoxigenic E coli (ETEC) NOT DETECTED NOT DETECTED Final   Shiga like toxin producing E coli (STEC) NOT DETECTED NOT DETECTED Final   Shigella/Enteroinvasive E coli (EIEC) NOT DETECTED NOT DETECTED Final   Cryptosporidium NOT DETECTED NOT DETECTED Final   Cyclospora cayetanensis NOT DETECTED NOT DETECTED Final   Entamoeba histolytica NOT DETECTED NOT DETECTED Final   Giardia lamblia NOT DETECTED NOT DETECTED Final   Adenovirus F40/41 NOT DETECTED NOT DETECTED Final   Astrovirus NOT DETECTED NOT DETECTED Final   Norovirus GI/GII NOT DETECTED NOT DETECTED Final   Rotavirus A NOT DETECTED NOT DETECTED Final   Sapovirus (I, II, IV, and V) NOT DETECTED NOT DETECTED Final    Comment: Performed at [facility], [address] [place], [place] [address]  C. Diff by PCR, Reflexed     Status: Abnormal   Collection Time: 01/05/18  9:45 AM  Result Value Ref Range Status   Toxigenic C. Difficile by PCR POSITIVE (A) NEGATIVE Final    Comment: Positive for toxigenic C. difficile with little to no toxin production. Only treat if clinical presentation suggests symptomatic illness. Performed at [facility], [address] [place], [place] [address]         Code Status Orders  (From admission, onward)        Start     Ordered   01/08/18 2351  Full code  Continuous     01/08/18 2351    Code Status History    Date Active Date Inactive Code Status Order ID Comments User Context   10/30/2017 0113 11/01/2017 1421 Full Code [id number]  Lance Coon, MD ED   09/24/2017 0848 09/27/2017 1409 Full Code [id number]  Loletha Grayer, MD ED   07/03/2017 0654 07/05/2017 1248 Full Code [id number]  Saundra Shelling, MD ED   04/25/2017 0146 04/26/2017 1516 Full Code [id number]  Hugelmeyer, Alexis, DO ED   01/28/2017 1033 01/30/2017 1741 Full Code [id number]  [name], MD Inpatient   01/22/2017 0114 01/24/2017 1759 Full Code [id number]  Lance Coon, MD Inpatient    12/09/2016 0209 12/09/2016 1507 Full Code [id number]  Lance Coon, MD Inpatient   11/21/2015 0650 11/22/2015 1550 Full Code [id number]  Saundra Shelling, MD Inpatient   09/19/2015 0511 09/21/2015 1829 Full Code [id number]  Harrie Foreman, MD Inpatient          Follow-up Information    [name], MD. Go on 01/23/2018.   Specialty:  Family Medicine Why:  Tuesday at 11:00am for hospital follow-up Contact information: [place] [address] [phone]        Lollie Sails, MD In 1 week.   Specialty:  Gastroenterology Why:  hospital f/u Contact information: [facility] [place] [address] [phone]  Discharge Medications   Allergies as of 01/11/2018      Reactions   Contrast Media [iodinated Diagnostic Agents] Shortness Of Breath   Metrizamide Shortness Of Breath   Penicillins Hives, Other (See Comments)   Other reaction(s): Other (See Comments) Has patient had a PCN reaction causing immediate rash, facial/tongue/throat swelling, SOB or lightheadedness with hypotension: Yes Has patient had a PCN reaction causing severe rash involving mucus membranes or skin necrosis: No Has patient had a PCN reaction that required hospitalization No Has patient had a PCN reaction occurring within the last 10 years: No If all of the above answers are "NO", then may proceed with Cephalosporin use. Has patient had a PCN reaction causing immediate rash, facial/tongue/throat swelling, SOB or lightheadedness with hypotension: Yes Has patient had a PCN reaction causing severe rash involving mucus membranes or skin necrosis: No Has patient had a PCN reaction that required hospitalization No Has patient had a PCN reaction occurring within the last 10 years: No If all of the above answers are "NO", then may proceed with Cephalosporin use.   Isosorbide Nitrate    Other reaction(s): Headache   Latex Hives   Ondansetron Other (See  Comments)   Severe headache   Zofran [ondansetron Hcl] Other (See Comments)   Causes migraines   Povidone-iodine Rash   Blistering rash   Pulmicort [budesonide] Itching      Medication List    STOP taking these medications   furosemide 40 MG tablet Commonly known as:  LASIX   oseltamivir 75 MG capsule Commonly known as:  TAMIFLU   predniSONE 10 MG (21) Tbpk tablet Commonly known as:  STERAPRED UNI-PAK 21 TAB     TAKE these medications   acetaminophen 325 MG tablet Commonly known as:  TYLENOL Take 2 tablets (650 mg total) by mouth every 6 (six) hours as needed for mild pain (or Fever >/= 101).   albuterol 1.25 MG/3ML nebulizer solution Commonly known as:  ACCUNEB Take 3 mLs by nebulization every 6 (six) hours as needed for wheezing.   beclomethasone 80 MCG/ACT inhaler Commonly known as:  QVAR Inhale 2 puffs into the lungs 2 (two) times daily.   chlorpheniramine-HYDROcodone 10-8 MG/5ML Suer Commonly known as:  TUSSIONEX Take 5 mLs by mouth every 12 (twelve) hours as needed for cough.   dicyclomine 10 MG capsule Commonly known as:  BENTYL Take 10 mg by mouth 3 (three) times daily as needed (abdominal pain).   ferrous sulfate 325 (65 FE) MG tablet Take 325 mg by mouth daily with breakfast.   fidaxomicin 200 MG Tabs tablet Commonly known as:  DIFICID Take 1 tablet (200 mg total) by mouth 2 (two) times daily for 10 days.   HYDROcodone-acetaminophen 5-325 MG tablet Commonly known as:  NORCO/VICODIN Take 1-2 tablets by mouth every 4 (four) hours as needed for moderate pain. Notes to patient:  Last dose given today 12:45pm   losartan 25 MG tablet Commonly known as:  COZAAR Take 12.5 mg by mouth 2 (two) times daily.   metoprolol succinate 50 MG 24 hr tablet Commonly known as:  TOPROL-XL Take 1 tablet (50 mg total) by mouth daily. Take with or immediately following a meal. What changed:    how much to take  additional instructions   multivitamin with minerals  Tabs tablet Take 1 tablet by mouth daily.   nitroGLYCERIN 0.4 MG SL tablet Commonly known as:  NITROSTAT Place 1 tablet (0.4 mg total) under the tongue every 5 (five) minutes as needed  for chest pain.   pantoprazole 40 MG tablet Commonly known as:  PROTONIX Take 40 mg by mouth 2 (two) times daily.   promethazine 6.25 MG/5ML syrup Commonly known as:  PHENERGAN Take 10 mLs (12.5 mg total) by mouth every 6 (six) hours as needed for nausea or vomiting. Notes to patient:  Last dose given today at 8:20am   rOPINIRole 2 MG tablet Commonly known as:  REQUIP Take 1 tablet (2 mg total) by mouth at bedtime. 2-4  Mg per night What changed:    how much to take  additional instructions   sertraline 50 MG tablet Commonly known as:  ZOLOFT Take 50 mg by mouth daily.   spironolactone 50 MG tablet Commonly known as:  ALDACTONE Take 50 mg by mouth daily.   traMADol 50 MG tablet Commonly known as:  ULTRAM Take 1 tablet (50 mg total) by mouth every 6 (six) hours as needed.   vitamin B-12 500 MCG tablet Commonly known as:  CYANOCOBALAMIN Take 500 mcg by mouth daily.          Total Time in preparing paper work, data evaluation and todays exam - 107 minutes  Dustin Flock M.D on 01/11/2018 at 2:49 PM [place]  [phone]

## 2018-01-11 NOTE — Progress Notes (Signed)
Pharmacy consulted for electrolyte replacement protocol:    52 year old female with C Diff infection requiring electrolyte replacement. Patient has been experiencing hypokalemia despite being on spironolactone.    Goal of therapy: Electrolytes within normal limits:  K 3.5 - 5.1 Corrected Ca 8.9 - 10.3 Phos 2.5 - 4.6 Mg 1.7 - 2.4   Assessment: Lab Results  Component Value Date   CREATININE 0.81 01/11/2018   BUN 8 01/11/2018   NA 141 01/11/2018   K 3.3 (L) 01/11/2018   CL 106 01/11/2018   CO2 27 01/11/2018    Plan: KCl 97meq q1h x 3 doses. Will recheck electrolytes with AM labs.    Thomasenia Sales, PharmD, MBA, [facility]

## 2018-01-14 ENCOUNTER — Other Ambulatory Visit: Payer: Self-pay

## 2018-01-14 ENCOUNTER — Emergency Department: Payer: Medicaid Other

## 2018-01-14 ENCOUNTER — Inpatient Hospital Stay
Admission: EM | Admit: 2018-01-14 | Discharge: 2018-01-16 | DRG: 392 | Disposition: A | Discharge status: Home / Self Care | Payer: Medicaid Other | Attending: Internal Medicine | Admitting: Internal Medicine

## 2018-01-14 ENCOUNTER — Encounter: Payer: Self-pay | Admitting: *Deleted

## 2018-01-14 DIAGNOSIS — J45909 Unspecified asthma, uncomplicated: Secondary | ICD-10-CM | POA: Diagnosis present

## 2018-01-14 DIAGNOSIS — Z888 Allergy status to other drugs, medicaments and biological substances status: Secondary | ICD-10-CM | POA: Diagnosis not present

## 2018-01-14 DIAGNOSIS — A0819 Acute gastroenteropathy due to other small round viruses: Secondary | ICD-10-CM | POA: Diagnosis not present

## 2018-01-14 DIAGNOSIS — Z95810 Presence of automatic (implantable) cardiac defibrillator: Secondary | ICD-10-CM | POA: Diagnosis not present

## 2018-01-14 DIAGNOSIS — Z6836 Body mass index (BMI) 36.0-36.9, adult: Secondary | ICD-10-CM

## 2018-01-14 DIAGNOSIS — I5022 Chronic systolic (congestive) heart failure: Secondary | ICD-10-CM | POA: Diagnosis present

## 2018-01-14 DIAGNOSIS — Z9049 Acquired absence of other specified parts of digestive tract: Secondary | ICD-10-CM | POA: Diagnosis not present

## 2018-01-14 DIAGNOSIS — F329 Major depressive disorder, single episode, unspecified: Secondary | ICD-10-CM | POA: Diagnosis present

## 2018-01-14 DIAGNOSIS — Z91041 Radiographic dye allergy status: Secondary | ICD-10-CM

## 2018-01-14 DIAGNOSIS — Z808 Family history of malignant neoplasm of other organs or systems: Secondary | ICD-10-CM

## 2018-01-14 DIAGNOSIS — A0472 Enterocolitis due to Clostridium difficile, not specified as recurrent: Secondary | ICD-10-CM

## 2018-01-14 DIAGNOSIS — I110 Hypertensive heart disease with heart failure: Secondary | ICD-10-CM | POA: Diagnosis present

## 2018-01-14 DIAGNOSIS — R55 Syncope and collapse: Secondary | ICD-10-CM

## 2018-01-14 DIAGNOSIS — Z8249 Family history of ischemic heart disease and other diseases of the circulatory system: Secondary | ICD-10-CM

## 2018-01-14 DIAGNOSIS — Z91040 Latex allergy status: Secondary | ICD-10-CM

## 2018-01-14 DIAGNOSIS — Z8619 Personal history of other infectious and parasitic diseases: Secondary | ICD-10-CM

## 2018-01-14 DIAGNOSIS — E860 Dehydration: Secondary | ICD-10-CM | POA: Diagnosis present

## 2018-01-14 DIAGNOSIS — Z803 Family history of malignant neoplasm of breast: Secondary | ICD-10-CM

## 2018-01-14 DIAGNOSIS — Z883 Allergy status to other anti-infective agents status: Secondary | ICD-10-CM

## 2018-01-14 DIAGNOSIS — Z880 Allergy status to penicillin: Secondary | ICD-10-CM | POA: Diagnosis not present

## 2018-01-14 DIAGNOSIS — Z8041 Family history of malignant neoplasm of ovary: Secondary | ICD-10-CM

## 2018-01-14 DIAGNOSIS — I480 Paroxysmal atrial fibrillation: Secondary | ICD-10-CM | POA: Diagnosis present

## 2018-01-14 DIAGNOSIS — Z90710 Acquired absence of both cervix and uterus: Secondary | ICD-10-CM | POA: Diagnosis not present

## 2018-01-14 DIAGNOSIS — E669 Obesity, unspecified: Secondary | ICD-10-CM | POA: Diagnosis present

## 2018-01-14 DIAGNOSIS — Z8614 Personal history of Methicillin resistant Staphylococcus aureus infection: Secondary | ICD-10-CM | POA: Diagnosis not present

## 2018-01-14 DIAGNOSIS — A0811 Acute gastroenteropathy due to Norwalk agent: Secondary | ICD-10-CM | POA: Diagnosis not present

## 2018-01-14 DIAGNOSIS — Z7951 Long term (current) use of inhaled steroids: Secondary | ICD-10-CM

## 2018-01-14 HISTORY — DX: Enterocolitis due to Clostridium difficile, not specified as recurrent: A04.72

## 2018-01-14 LAB — CBC
HCT: 38.4 % (ref 35.0–47.0)
Hemoglobin: 12.6 g/dL (ref 12.0–16.0)
MCH: 26.2 pg (ref 26.0–34.0)
MCHC: 32.9 g/dL (ref 32.0–36.0)
MCV: 79.6 fL — ABNORMAL LOW (ref 80.0–100.0)
PLATELETS: 430 10*3/uL (ref 150–440)
RBC: 4.82 MIL/uL (ref 3.80–5.20)
RDW: 19 % — AB (ref 11.5–14.5)
WBC: 17.6 10*3/uL — AB (ref 3.6–11.0)

## 2018-01-14 LAB — COMPREHENSIVE METABOLIC PANEL
ALBUMIN: 3.8 g/dL (ref 3.5–5.0)
ALT: 26 U/L (ref 14–54)
AST: 34 U/L (ref 15–41)
Alkaline Phosphatase: 102 U/L (ref 38–126)
Anion gap: 11 (ref 5–15)
BUN: 12 mg/dL (ref 6–20)
CHLORIDE: 99 mmol/L — AB (ref 101–111)
CO2: 25 mmol/L (ref 22–32)
Calcium: 9.4 mg/dL (ref 8.9–10.3)
Creatinine, Ser: 0.86 mg/dL (ref 0.44–1.00)
GFR calc Af Amer: >60 mL/min (ref >60)
GLUCOSE: 105 mg/dL — AB (ref 65–99)
POTASSIUM: 3.9 mmol/L (ref 3.5–5.1)
SODIUM: 135 mmol/L (ref 135–145)
Total Bilirubin: 0.7 mg/dL (ref 0.3–1.2)
Total Protein: 8 g/dL (ref 6.5–8.1)

## 2018-01-14 LAB — URINALYSIS, COMPLETE (UACMP) WITH MICROSCOPIC
Bacteria, UA: NONE SEEN
Bilirubin Urine: NEGATIVE
Glucose, UA: NEGATIVE mg/dL
Hgb urine dipstick: NEGATIVE
Ketones, ur: NEGATIVE mg/dL
Leukocytes, UA: NEGATIVE
Nitrite: NEGATIVE
PH: 7 (ref 5.0–8.0)
PROTEIN: 30 mg/dL — AB
Specific Gravity, Urine: 1.021 (ref 1.005–1.030)

## 2018-01-14 LAB — GASTROINTESTINAL PANEL BY PCR, STOOL (REPLACES STOOL CULTURE)
ADENOVIRUS F40/41: NOT DETECTED
ASTROVIRUS: NOT DETECTED
Campylobacter species: NOT DETECTED
Cryptosporidium: NOT DETECTED
Cyclospora cayetanensis: NOT DETECTED
ENTAMOEBA HISTOLYTICA: NOT DETECTED
ENTEROTOXIGENIC E COLI (ETEC): NOT DETECTED
Enteroaggregative E coli (EAEC): NOT DETECTED
Enteropathogenic E coli (EPEC): NOT DETECTED
GIARDIA LAMBLIA: NOT DETECTED
Norovirus GI/GII: DETECTED — AB
Plesimonas shigelloides: NOT DETECTED
Rotavirus A: NOT DETECTED
SALMONELLA SPECIES: NOT DETECTED
SAPOVIRUS (I, II, IV, AND V): NOT DETECTED
Shiga like toxin producing E coli (STEC): NOT DETECTED
Shigella/Enteroinvasive E coli (EIEC): NOT DETECTED
Vibrio cholerae: NOT DETECTED
Vibrio species: NOT DETECTED
Yersinia enterocolitica: NOT DETECTED

## 2018-01-14 LAB — C DIFFICILE QUICK SCREEN W PCR REFLEX
C DIFFICILE (CDIFF) TOXIN: NEGATIVE
C DIFFICLE (CDIFF) ANTIGEN: NEGATIVE
C Diff interpretation: NOT DETECTED

## 2018-01-14 LAB — TROPONIN I: Troponin I: <0.03 ng/mL (ref <0.03)

## 2018-01-14 LAB — LIPASE, BLOOD: LIPASE: 39 U/L (ref 11–51)

## 2018-01-14 MED ORDER — PANTOPRAZOLE SODIUM 40 MG PO TBEC: 40.0000 mg | DELAYED_RELEASE_TABLET | Freq: Two times a day (BID) | ORAL | Status: DC

## 2018-01-14 MED ORDER — SERTRALINE HCL 50 MG PO TABS
50.0000 mg | ORAL_TABLET | Freq: Every day | ORAL | Status: DC
  Administered 2018-01-15 – 2018-01-16 (×2): 50 mg via ORAL
  Filled 2018-01-14 (×2): qty 1

## 2018-01-14 MED ORDER — MORPHINE SULFATE (PF) 4 MG/ML IV SOLN
4.0000 mg | Freq: Once | INTRAVENOUS | Status: AC
  Administered 2018-01-14: 4 mg via INTRAVENOUS
  Filled 2018-01-14: qty 1

## 2018-01-14 MED ORDER — ACETAMINOPHEN 325 MG PO TABS: 650.0000 mg | ORAL_TABLET | Freq: Four times a day (QID) | ORAL | Status: DC | PRN

## 2018-01-14 MED ORDER — PROMETHAZINE HCL 25 MG/ML IJ SOLN
12.5000 mg | Freq: Four times a day (QID) | INTRAMUSCULAR | Status: DC | PRN
  Administered 2018-01-15 (×3): 12.5 mg via INTRAVENOUS
  Filled 2018-01-14 (×4): qty 1

## 2018-01-14 MED ORDER — FIDAXOMICIN 200 MG PO TABS
200.0000 mg | ORAL_TABLET | Freq: Two times a day (BID) | ORAL | Status: DC
  Administered 2018-01-14 – 2018-01-16 (×4): 200 mg via ORAL
  Filled 2018-01-14 (×6): qty 1

## 2018-01-14 MED ORDER — ROPINIROLE HCL 1 MG PO TABS
4.0000 mg | ORAL_TABLET | Freq: Every day | ORAL | Status: DC
  Administered 2018-01-14 – 2018-01-15 (×2): 4 mg via ORAL
  Filled 2018-01-14 (×2): qty 4

## 2018-01-14 MED ORDER — PROMETHAZINE HCL 25 MG/ML IJ SOLN
25.0000 mg | Freq: Once | INTRAMUSCULAR | Status: AC
  Administered 2018-01-14: 25 mg via INTRAVENOUS

## 2018-01-14 MED ORDER — VITAMIN B-12 1000 MCG PO TABS
500.0000 ug | ORAL_TABLET | Freq: Every day | ORAL | Status: DC
  Administered 2018-01-15 – 2018-01-16 (×2): 500 ug via ORAL
  Filled 2018-01-14 (×2): qty 1

## 2018-01-14 MED ORDER — ACETAMINOPHEN 650 MG RE SUPP: 650.0000 mg | Freq: Four times a day (QID) | RECTAL | Status: DC | PRN

## 2018-01-14 MED ORDER — ADULT MULTIVITAMIN W/MINERALS CH
1.0000 | ORAL_TABLET | Freq: Every day | ORAL | Status: DC
  Administered 2018-01-15 – 2018-01-16 (×2): 1 via ORAL
  Filled 2018-01-14 (×2): qty 1

## 2018-01-14 MED ORDER — ALBUTEROL SULFATE (2.5 MG/3ML) 0.083% IN NEBU
3.0000 mL | INHALATION_SOLUTION | Freq: Four times a day (QID) | RESPIRATORY_TRACT | Status: DC | PRN
  Administered 2018-01-15: 3 mL via RESPIRATORY_TRACT
  Filled 2018-01-14: qty 3

## 2018-01-14 MED ORDER — METOPROLOL SUCCINATE ER 100 MG PO TB24
100.0000 mg | ORAL_TABLET | Freq: Every day | ORAL | Status: DC
  Administered 2018-01-15 – 2018-01-16 (×2): 100 mg via ORAL
  Filled 2018-01-14 (×2): qty 1

## 2018-01-14 MED ORDER — HYDROCODONE-ACETAMINOPHEN 5-325 MG PO TABS
1.0000 | ORAL_TABLET | ORAL | Status: DC | PRN
  Administered 2018-01-14: 2 via ORAL
  Administered 2018-01-14 (×2): 1 via ORAL
  Administered 2018-01-15 – 2018-01-16 (×3): 2 via ORAL
  Filled 2018-01-14: qty 1
  Filled 2018-01-14 (×3): qty 2
  Filled 2018-01-14: qty 1
  Filled 2018-01-14: qty 2

## 2018-01-14 MED ORDER — PROMETHAZINE HCL 25 MG/ML IJ SOLN
25.0000 mg | Freq: Once | INTRAMUSCULAR | Status: AC
  Administered 2018-01-14: 25 mg via INTRAVENOUS
  Filled 2018-01-14: qty 1

## 2018-01-14 MED ORDER — SPIRONOLACTONE 25 MG PO TABS
50.0000 mg | ORAL_TABLET | Freq: Every day | ORAL | Status: DC
  Administered 2018-01-15 – 2018-01-16 (×2): 50 mg via ORAL
  Filled 2018-01-14 (×2): qty 2

## 2018-01-14 MED ORDER — LOSARTAN POTASSIUM 25 MG PO TABS
12.5000 mg | ORAL_TABLET | Freq: Two times a day (BID) | ORAL | Status: DC
  Administered 2018-01-14 – 2018-01-16 (×4): 12.5 mg via ORAL
  Filled 2018-01-14 (×4): qty 1

## 2018-01-14 MED ORDER — ENOXAPARIN SODIUM 40 MG/0.4ML ~~LOC~~ SOLN
40.0000 mg | SUBCUTANEOUS | Status: DC
  Administered 2018-01-14 – 2018-01-15 (×2): 40 mg via SUBCUTANEOUS
  Filled 2018-01-14 (×2): qty 0.4

## 2018-01-14 MED ORDER — NITROGLYCERIN 0.4 MG SL SUBL: 0.4000 mg | SUBLINGUAL_TABLET | SUBLINGUAL | Status: DC | PRN

## 2018-01-14 MED ORDER — BECLOMETHASONE DIPROPIONATE 80 MCG/ACT IN AERS: 2.0000 | INHALATION_SPRAY | Freq: Two times a day (BID) | RESPIRATORY_TRACT | Status: DC

## 2018-01-14 MED ORDER — BUDESONIDE 0.25 MG/2ML IN SUSP
0.2500 mg | Freq: Two times a day (BID) | RESPIRATORY_TRACT | Status: DC
  Administered 2018-01-14 – 2018-01-16 (×4): 0.25 mg via RESPIRATORY_TRACT
  Filled 2018-01-14 (×4): qty 2

## 2018-01-14 MED ORDER — PROMETHAZINE HCL 6.25 MG/5ML PO SYRP
12.5000 mg | ORAL_SOLUTION | Freq: Four times a day (QID) | ORAL | Status: DC | PRN
  Filled 2018-01-14: qty 10

## 2018-01-14 MED ORDER — SENNOSIDES-DOCUSATE SODIUM 8.6-50 MG PO TABS: 1.0000 | ORAL_TABLET | Freq: Every evening | ORAL | Status: DC | PRN

## 2018-01-14 MED ORDER — SODIUM CHLORIDE 0.9 % IV SOLN
Freq: Once | INTRAVENOUS | Status: AC
  Administered 2018-01-14: 11:00:00 via INTRAVENOUS

## 2018-01-14 MED ORDER — PROMETHAZINE HCL 25 MG PO TABS
12.5000 mg | ORAL_TABLET | Freq: Four times a day (QID) | ORAL | Status: DC | PRN
Start: 2018-01-14 — End: 2018-01-16
  Administered 2018-01-14 – 2018-01-16 (×2): 12.5 mg via ORAL
  Filled 2018-01-14 (×3): qty 1

## 2018-01-14 MED ORDER — FERROUS SULFATE 325 (65 FE) MG PO TABS
325.0000 mg | ORAL_TABLET | Freq: Every day | ORAL | Status: DC
  Administered 2018-01-15 – 2018-01-16 (×2): 325 mg via ORAL
  Filled 2018-01-14 (×2): qty 1

## 2018-01-14 MED ORDER — PROMETHAZINE HCL 25 MG/ML IJ SOLN
INTRAMUSCULAR | Status: AC
  Administered 2018-01-14: 25 mg via INTRAVENOUS
  Filled 2018-01-14: qty 1

## 2018-01-14 MED ORDER — DICYCLOMINE HCL 10 MG PO CAPS: 10.0000 mg | ORAL_CAPSULE | Freq: Three times a day (TID) | ORAL | Status: DC | PRN

## 2018-01-14 MED ORDER — HYDROCODONE-ACETAMINOPHEN 5-325 MG PO TABS: 1.0000 | ORAL_TABLET | ORAL | Status: DC | PRN

## 2018-01-14 NOTE — ED Provider Notes (Signed)
[facility] Emergency Department Provider Note   ____________________________________________    I have reviewed the triage vital signs and the nursing notes.   HISTORY  Chief Complaint Abdominal Pain and Diarrhea     HPI Brittany Nguyen is a 52 y.o. female recently discharged from the hospital after treatment for recurrent C. difficile.  Patient reports this morning she was feeling nauseated and weak, apparently syncopized while having a bowel movement.  Called EMS.  Has been feeling okay after discharge 2 days ago but this morning feels worse.  Continues to have diarrhea.  Positive nausea and vomiting.  Lower abdominal cramping which is essentially unchanged.  Feels lightheaded and dizzy  Past Medical History:  Diagnosis Date  . Asthma 2013  . CHF (congestive heart failure) ([place])   . Diverticulitis 2010  . DVT (deep venous thrombosis) ([place])   . Endometriosis 1990  . Heart disease 2013  . Hx MRSA infection   . Lump or mass in breast   . Restless leg     Patient Active Problem List   Diagnosis Date Noted  . GIB (gastrointestinal bleeding) 01/08/2018  . SOB (shortness of breath) 11/01/2017  . Influenza A 10/29/2017  . Acute respiratory failure ([place]) 09/24/2017  . Sepsis ([place]) 07/03/2017  . COPD with acute exacerbation ([place]) 04/26/2017  . Community acquired pneumonia 04/25/2017  . Chronic systolic heart failure ([place]) 02/03/2017  . Hypotension 02/03/2017  . Chest pain 12/09/2016  . RLS (restless legs syndrome) 12/09/2016  . Cough, persistent 11/22/2015  . Hypokalemia 11/22/2015  . Leukocytosis 11/22/2015  . Dyspnea 11/21/2015  . Respiratory distress 09/19/2015  . Breast pain 01/11/2013  . Hx MRSA infection   . Lump or mass in breast   . GOITER, MULTINODULAR 01/20/2009  . HYPOGLYCEMIA, UNSPECIFIED 01/20/2009  . GERD 01/20/2009  . DIVERTICULITIS OF COLON 01/20/2009    Past Surgical History:  Procedure Laterality Date  .  ABDOMINAL HYSTERECTOMY     age 89  . CARDIAC DEFIBRILLATOR PLACEMENT    . CESAREAN SECTION    . CHOLECYSTECTOMY    . COLECTOMY    . NASAL SINUS SURGERY  2012    Prior to Admission medications   Medication Sig Start Date End Date Taking? Authorizing Provider  acetaminophen (TYLENOL) 325 MG tablet Take 2 tablets (650 mg total) by mouth every 6 (six) hours as needed for mild pain (or Fever >/= 101). 11/01/17   Gouru, Illene Silver, MD  albuterol (ACCUNEB) 1.25 MG/3ML nebulizer solution Take 3 mLs by nebulization every 6 (six) hours as needed for wheezing. 11/09/17   [provider]  beclomethasone (QVAR) 80 MCG/ACT inhaler Inhale 2 puffs into the lungs 2 (two) times daily. 06/05/17 06/05/18  Laverle Hobby, MD  chlorpheniramine-HYDROcodone (TUSSIONEX) 10-8 MG/5ML SUER Take 5 mLs by mouth every 12 (twelve) hours as needed for cough. Patient not taking: Reported on 01/08/2018 11/01/17   Nicholes Mango, MD  cyanocobalamin 500 MCG tablet Take 500 mcg by mouth daily.    [provider]  dicyclomine (BENTYL) 10 MG capsule Take 10 mg by mouth 3 (three) times daily as needed (abdominal pain).  12/29/17   [provider]  ferrous sulfate 325 (65 FE) MG tablet Take 325 mg by mouth daily with breakfast.    [provider]  fidaxomicin (DIFICID) 200 MG TABS tablet Take 1 tablet (200 mg total) by mouth 2 (two) times daily for 10 days. 01/11/18 01/21/18  Dustin Flock, MD  HYDROcodone-acetaminophen (NORCO/VICODIN) 5-325 MG tablet  Take 1-2 tablets by mouth every 4 (four) hours as needed for moderate pain. 01/11/18   Dustin Flock, MD  losartan (COZAAR) 25 MG tablet Take 12.5 mg by mouth 2 (two) times daily.    [provider]  metoprolol succinate (TOPROL-XL) 50 MG 24 hr tablet Take 1 tablet (50 mg total) by mouth daily. Take with or immediately following a meal. Patient taking differently: Take 100 mg by mouth daily. Take with or immediately following a meal. 09/27/17   Loletha Grayer, MD  Multiple Vitamin (MULTIVITAMIN WITH MINERALS) TABS tablet Take 1 tablet by mouth daily.    [provider]  nitroGLYCERIN (NITROSTAT) 0.4 MG SL tablet Place 1 tablet (0.4 mg total) under the tongue every 5 (five) minutes as needed for chest pain. 01/24/17   Henreitta Leber, MD  pantoprazole (PROTONIX) 40 MG tablet Take 40 mg by mouth 2 (two) times daily. 01/02/18   [provider]  promethazine (PHENERGAN) 6.25 MG/5ML syrup Take 10 mLs (12.5 mg total) by mouth every 6 (six) hours as needed for nausea or vomiting. 01/11/18 01/11/19  Dustin Flock, MD  rOPINIRole (REQUIP) 2 MG tablet Take 1 tablet (2 mg total) by mouth at bedtime. 2-4  Mg per night Patient taking differently: Take 4 mg by mouth at bedtime.  04/26/17   Bettey Costa, MD  sertraline (ZOLOFT) 50 MG tablet Take 50 mg by mouth daily.    [provider]  spironolactone (ALDACTONE) 50 MG tablet Take 50 mg by mouth daily.    [provider]  traMADol (ULTRAM) 50 MG tablet Take 1 tablet (50 mg total) by mouth every 6 (six) hours as needed. 11/01/17   Nicholes Mango, MD     Allergies Contrast media [iodinated diagnostic agents]; Metrizamide; Penicillins; Isosorbide nitrate; Latex; Ondansetron; Zofran [ondansetron hcl]; Povidone-iodine; and Pulmicort [budesonide]  Family History  Problem Relation Age of Onset  . Cancer Mother 30       ovarian  . CAD Mother   . Cancer Father 64       brain  . CAD Father   . Cancer Daughter 18       skin  . Cancer Maternal Aunt 38       breast    Social History Social History   Tobacco Use  . Smoking status: Never Smoker  . Smokeless tobacco: Never Used  Substance Use Topics  . Alcohol use: No  . Drug use: No    Review of Systems  Constitutional: Positive weakness Eyes: No visual changes.  ENT: No sore throat. Cardiovascular: Denies chest pain. Respiratory: Denies shortness of breath. Gastrointestinal: Lower abdominal cramping, nausea  vomiting Genitourinary: Negative for dysuria. Musculoskeletal: Negative for back pain. Skin: Negative for rash. Neurological: Negative for headaches    ____________________________________________   PHYSICAL EXAM:  VITAL SIGNS: ED Triage Vitals  Enc Vitals Group     BP 01/14/18 1118 110/88     Pulse Rate 01/14/18 1118 (!) 44     Resp 01/14/18 1118 18     Temp 01/14/18 1118 98.1 F (36.7 C)     Temp Source 01/14/18 1118 Oral     SpO2 01/14/18 1118 97 %     Weight 01/14/18 1115 99.8 kg (220 lb)     Height 01/14/18 1115 1.651 m (5\' 5" )     Head Circumference --      Peak Flow --      Pain Score 01/14/18 1114 10     Pain Loc --  Pain Edu? --      Excl. in [place]? --     Constitutional: Alert and oriented.  Ill-appearing Eyes: Conjunctivae are normal.  Head: Atraumatic. Nose: No congestion/rhinnorhea. Mouth/Throat: Mucous membranes are dry Neck:  Painless ROM Cardiovascular: Tachycardia, regular rhythm. Grossly normal heart sounds.  Good peripheral circulation. Respiratory: Normal respiratory effort.  No retractions.  Gastrointestinal: Mild tenderness right lower quadrant left lower quadrant. No distention.  Genitourinary: deferred Musculoskeletal: No lower extremity tenderness nor edema.  Warm and well perfused Neurologic:  Normal speech and language. No gross focal neurologic deficits are appreciated.  Skin:  Skin is warm, dry and intact. No rash noted. Psychiatric: Mood and affect are normal. Speech and behavior are normal.  ____________________________________________   LABS (all labs ordered are listed, but only abnormal results are displayed)  Labs Reviewed  COMPREHENSIVE METABOLIC PANEL - Abnormal; Notable for the following components:      Result Value   Chloride 99 (*)    Glucose, Bld 105 (*)    All other components within normal limits  CBC - Abnormal; Notable for the following components:   WBC 17.6 (*)    MCV 79.6 (*)    RDW 19.0 (*)    All other  components within normal limits  URINALYSIS, COMPLETE (UACMP) WITH MICROSCOPIC - Abnormal; Notable for the following components:   Color, Urine AMBER (*)    APPearance CLOUDY (*)    Protein, ur 30 (*)    Squamous Epithelial / LPF >50 (*)    All other components within normal limits  LIPASE, BLOOD  TROPONIN I   ____________________________________________  EKG  ED ECG REPORT I, Lavonia Drafts, the attending physician, personally viewed and interpreted this ECG.  Date: 01/14/2018  Rhythm: Sinus tachycardia QRS Axis: normal Intervals: LAFB ST/T Wave abnormalities: normal Narrative Interpretation: no evidence of acute ischemia  ____________________________________________  RADIOLOGY  KUB unremarkable ____________________________________________   PROCEDURES  Procedure(s) performed: No  Procedures   Critical Care performed: No ____________________________________________   INITIAL IMPRESSION / ASSESSMENT AND PLAN / ED COURSE  Pertinent labs & imaging results that were available during my care of the patient were reviewed by me and considered in my medical decision making (see chart for details).  Patient with active C. difficile, reports compliance with her antibiotics presents after syncopal episode.  She is tachycardic appears dehydrated.  IV fluids, IV Zofran infusing  After fluids patient remains significantly tachycardic with borderline blood pressure.  Will admit to the hospital service for further rehydration, treatment for nausea vomiting and abdominal pain.  CT scan ordered at the request of Dr. Benjie Karvonen    ____________________________________________   FINAL CLINICAL IMPRESSION(S) / ED DIAGNOSES  Final diagnoses:  Syncope, unspecified syncope type  Dehydration        Note:  This document was prepared using Dragon voice recognition software and may include unintentional dictation errors.    Lavonia Drafts, MD 01/14/18 1359

## 2018-01-14 NOTE — Progress Notes (Signed)
Received call from ED RN stating that lab informed her that pt is Norovirus (+).    Dr. Benjie Karvonen notified.

## 2018-01-14 NOTE — ED Triage Notes (Signed)
Pt recently admitted and treated for cdiff. Pt states the diarrhea was "never cleared up," and was sent home with medication that pt is unable to remember the name of. Pt states this morning she "passed out," and has had unbearable lower abdominal pain and nausea. Upon arrival to the ED pt alert and oriented x4.  Pt assisted to the toilet in room and very unsteady on her feet

## 2018-01-14 NOTE — H&P (Signed)
[facility] [place] NAME: Brittany Nguyen    MR#:  [id number]  DATE OF BIRTH:  1966/07/18  DATE OF ADMISSION:  01/14/2018  PRIMARY CARE PHYSICIAN: [name], MD   REQUESTING/REFERRING PHYSICIAN: dr Corky Downs  CHIEF COMPLAINT:   Diarrhea HISTORY OF PRESENT ILLNESS:  Brittany Nguyen  is a 52 y.o. female with a known history of C. difficile colitis with recent discharge from the hospital on 9 March with Dificid after being seen by ID consultant who presents today due to ongoing diarrhea and lower abdominal pain.  Patient reports when she was discharged from the hospital her diarrhea and abdominal pain had subsided.  At that time she also had some rectal bleeding which was associate with C. difficile colitis.  Today she reports 24 hours of recurrent diarrhea without blood in her stools and lower abdominal pain which is different from the abdominal pain she had previously.  She also states that she feels her abdomen is more swollen and tender than normal.   She has been treated for C. difficile colitis x2 with vancomycin and upon last discharge on Dificid.  PAST MEDICAL HISTORY:   Past Medical History:  Diagnosis Date  . Asthma 2013  . CHF (congestive heart failure) ([place])   . Diverticulitis 2010  . DVT (deep venous thrombosis) ([place])   . Endometriosis 1990  . Heart disease 2013  . Hx MRSA infection   . Lump or mass in breast   . Restless leg     PAST SURGICAL HISTORY:   Past Surgical History:  Procedure Laterality Date  . ABDOMINAL HYSTERECTOMY     age 89  . CARDIAC DEFIBRILLATOR PLACEMENT    . CESAREAN SECTION    . CHOLECYSTECTOMY    . COLECTOMY    . NASAL SINUS SURGERY  2012    SOCIAL HISTORY:   Social History   Tobacco Use  . Smoking status: Never Smoker  . Smokeless tobacco: Never Used  Substance Use Topics  . Alcohol use: No    FAMILY HISTORY:   Family History  Problem Relation Age of Onset  . Cancer Mother 30        ovarian  . CAD Mother   . Cancer Father 24       brain  . CAD Father   . Cancer Daughter 18       skin  . Cancer Maternal Aunt 34       breast    DRUG ALLERGIES:   Allergies  Allergen Reactions  . Contrast Media [Iodinated Diagnostic Agents] Shortness Of Breath  . Metrizamide Shortness Of Breath  . Penicillins Hives and Other (See Comments)    Other reaction(s): Other (See Comments) Has patient had a PCN reaction causing immediate rash, facial/tongue/throat swelling, SOB or lightheadedness with hypotension: Yes Has patient had a PCN reaction causing severe rash involving mucus membranes or skin necrosis: No Has patient had a PCN reaction that required hospitalization No Has patient had a PCN reaction occurring within the last 10 years: No If all of the above answers are "NO", then may proceed with Cephalosporin use. Has patient had a PCN reaction causing immediate rash, facial/tongue/throat swelling, SOB or lightheadedness with hypotension: Yes Has patient had a PCN reaction causing severe rash involving mucus membranes or skin necrosis: No Has patient had a PCN reaction that required hospitalization No Has patient had a PCN reaction occurring within the last 10 years: No If all of the above answers  are "NO", then may proceed with Cephalosporin use.   . Isosorbide Nitrate     Other reaction(s): Headache  . Latex Hives  . Ondansetron Other (See Comments)    Severe headache  . Zofran [Ondansetron Hcl] Other (See Comments)    Causes migraines  . Povidone-Iodine Rash    Blistering rash  . Pulmicort [Budesonide] Itching    REVIEW OF SYSTEMS:   Review of Systems  Constitutional: Negative.  Negative for chills, fever and malaise/fatigue.  HENT: Negative.  Negative for ear discharge, ear pain, hearing loss, nosebleeds and sore throat.   Eyes: Negative.  Negative for blurred vision and pain.  Respiratory: Negative.  Negative for cough, hemoptysis, shortness of breath and  wheezing.   Cardiovascular: Negative.  Negative for chest pain, palpitations and leg swelling.  Gastrointestinal: Positive for abdominal pain, diarrhea, nausea and vomiting. Negative for blood in stool.  Genitourinary: Negative.  Negative for dysuria.  Musculoskeletal: Negative.  Negative for back pain.  Skin: Negative.   Neurological: Negative for dizziness, tremors, speech change, focal weakness, seizures and headaches.  Endo/Heme/Allergies: Negative.  Does not bruise/bleed easily.  Psychiatric/Behavioral: Negative.  Negative for depression, hallucinations and suicidal ideas.    MEDICATIONS AT HOME:   Prior to Admission medications   Medication Sig Start Date End Date Taking? Authorizing Provider  acetaminophen (TYLENOL) 325 MG tablet Take 2 tablets (650 mg total) by mouth every 6 (six) hours as needed for mild pain (or Fever >/= 101). 11/01/17  Yes Gouru, Illene Silver, MD  albuterol (ACCUNEB) 1.25 MG/3ML nebulizer solution Take 3 mLs by nebulization every 6 (six) hours as needed for wheezing. 11/09/17  Yes [provider]  beclomethasone (QVAR) 80 MCG/ACT inhaler Inhale 2 puffs into the lungs 2 (two) times daily. 06/05/17 06/05/18 Yes Laverle Hobby, MD  cyanocobalamin 500 MCG tablet Take 500 mcg by mouth daily.   Yes [provider]  dicyclomine (BENTYL) 10 MG capsule Take 10 mg by mouth 3 (three) times daily as needed (abdominal pain).  12/29/17  Yes [provider]  ferrous sulfate 325 (65 FE) MG tablet Take 325 mg by mouth daily with breakfast.   Yes [provider]  HYDROcodone-acetaminophen (NORCO/VICODIN) 5-325 MG tablet Take 1-2 tablets by mouth every 4 (four) hours as needed for moderate pain. 01/11/18  Yes Dustin Flock, MD  losartan (COZAAR) 25 MG tablet Take 12.5 mg by mouth 2 (two) times daily.   Yes [provider]  metoprolol succinate (TOPROL-XL) 50 MG 24 hr tablet Take 1 tablet (50 mg total) by mouth daily. Take with or immediately  following a meal. 09/27/17  Yes Wieting, Richard, MD  Multiple Vitamin (MULTIVITAMIN WITH MINERALS) TABS tablet Take 1 tablet by mouth daily.   Yes [provider]  nitroGLYCERIN (NITROSTAT) 0.4 MG SL tablet Place 1 tablet (0.4 mg total) under the tongue every 5 (five) minutes as needed for chest pain. 01/24/17  Yes Henreitta Leber, MD  pantoprazole (PROTONIX) 40 MG tablet Take 40 mg by mouth 2 (two) times daily. 01/02/18  Yes [provider]  promethazine (PHENERGAN) 6.25 MG/5ML syrup Take 10 mLs (12.5 mg total) by mouth every 6 (six) hours as needed for nausea or vomiting. 01/11/18 01/11/19 Yes Dustin Flock, MD  rOPINIRole (REQUIP) 2 MG tablet Take 1 tablet (2 mg total) by mouth at bedtime. 2-4  Mg per night Patient taking differently: Take 4 mg by mouth at bedtime.  04/26/17  Yes Hong Timm, Ulice Bold, MD  sertraline (ZOLOFT) 50 MG tablet Take 50  mg by mouth daily.   Yes [provider]  spironolactone (ALDACTONE) 50 MG tablet Take 50 mg by mouth daily.   Yes [provider]  traMADol (ULTRAM) 50 MG tablet Take 1 tablet (50 mg total) by mouth every 6 (six) hours as needed. 11/01/17  Yes Gouru, Illene Silver, MD  chlorpheniramine-HYDROcodone (TUSSIONEX) 10-8 MG/5ML SUER Take 5 mLs by mouth every 12 (twelve) hours as needed for cough. Patient not taking: Reported on 01/08/2018 11/01/17   Nicholes Mango, MD  fidaxomicin (DIFICID) 200 MG TABS tablet Take 1 tablet (200 mg total) by mouth 2 (two) times daily for 10 days. 01/11/18 01/21/18  Dustin Flock, MD      VITAL SIGNS:  Blood pressure (!) 95/53, pulse (!) 113, temperature 98.1 F (36.7 C), temperature source Oral, resp. rate 18, height 5\' 5"  (1.651 m), weight 99.8 kg (220 lb), last menstrual period 01/22/2012, SpO2 96 %.  PHYSICAL EXAMINATION:   Physical Exam  Constitutional: She is oriented to person, place, and time. No distress.  HENT:  Head: Normocephalic.  Eyes: No scleral icterus.  Neck: Normal range of motion. Neck supple.  No JVD present. No tracheal deviation present.  Cardiovascular: Normal rate, regular rhythm and normal heart sounds. Exam reveals no gallop and no friction rub.  No murmur heard. Pulmonary/Chest: Effort normal and breath sounds normal. No respiratory distress. She has no wheezes. She has no rales. She exhibits no tenderness.  Abdominal: Soft. Bowel sounds are normal. She exhibits no distension and no mass. There is tenderness in the left upper quadrant and left lower quadrant. There is no rebound and no guarding.  Musculoskeletal: Normal range of motion. She exhibits no edema.  Neurological: She is alert and oriented to person, place, and time.  Skin: Skin is warm. No rash noted. No erythema.  Psychiatric: Judgment normal.      LABORATORY PANEL:   CBC Recent Labs  Lab 01/14/18 1116  WBC 17.6*  HGB 12.6  HCT 38.4  PLT 430   ------------------------------------------------------------------------------------------------------------------  Chemistries  Recent Labs  Lab 01/11/18 0426 01/14/18 1116  NA 141 135  K 3.3* 3.9  CL 106 99*  CO2 27 25  GLUCOSE 112* 105*  BUN 8 12  CREATININE 0.81 0.86  CALCIUM 8.4* 9.4  MG 2.0  --   AST  --  34  ALT  --  26  ALKPHOS  --  102  BILITOT  --  0.7   ------------------------------------------------------------------------------------------------------------------  Cardiac Enzymes Recent Labs  Lab 01/14/18 1116  TROPONINI <0.03   ------------------------------------------------------------------------------------------------------------------  RADIOLOGY:  Dg Abdomen 1 View  Result Date: 01/14/2018 CLINICAL DATA:  C difficile colitis, persistent diarrhea, syncopal episode this morning, lower abdominal pain and nausea EXAM: ABDOMEN - 1 VIEW COMPARISON:  CT abdomen and pelvis 01/09/2018 FINDINGS: Food debris in stomach. Surgical clips RIGHT upper quadrant consistent with cholecystectomy. Retained contrast within a diverticulum at  the ascending colon. Normal bowel gas pattern without bowel dilatation or definite bowel wall thickening. Bones demineralized. Numerous pelvic phleboliths. IMPRESSION: Unremarkable abdominal radiograph. Electronically Signed   By: Lavonia Dana M.D.   On: 01/14/2018 12:22    EKG:  Sinus tachycardia no ST elevation or depression  IMPRESSION AND PLAN:     52 year old female who was discharged on May 9 due to C. difficile colitis who presents again with recurrent diarrhea and abdominal pain.  1.  Recurrent diarrhea likely C. difficile related Recheck C. difficile, GI panel and norovirus GI consult with Dr. Vira Agar for evaluation for  stool transplant. ID consultation requested as per last hospital stay patient was evaluated by ID Continue Dificid for now  enteric precautions Follow electrolytes Ordered CT of the abdomen due to significant abdominal tenderness and pain to evaluate for C. difficile  related megacolon  2.  Leukocytosis possibly related to C. Difficile  3.  Chronic systolic heart failure ejection fraction 30 to 35%: Continue Entresto and metoprolol and Aldactone  4.  Essential hypertension: Continue losartan and metoprolol  5.  Depression: Continue Zoloft 6.  History of PAF: Continue metoprolol for heart rate control  All the records are reviewed and case discussed with ED provider. Management plans discussed with the patient and she is in agreement  CODE STATUS: full  TOTAL TIME TAKING CARE OF THIS PATIENT: 42 minutes.    Jace Fermin M.D on 01/14/2018 at 2:17 PM  Between 7am to 6pm - Pager - [phone]  After 6pm go to [email or web address] - password [email or web address] [facility]  Office  [phone]  CC: Primary care physician; [name], MD

## 2018-01-15 DIAGNOSIS — A0811 Acute gastroenteropathy due to Norwalk agent: Secondary | ICD-10-CM

## 2018-01-15 LAB — BASIC METABOLIC PANEL
Anion gap: 6 (ref 5–15)
BUN: 11 mg/dL (ref 6–20)
CO2: 28 mmol/L (ref 22–32)
CREATININE: 0.75 mg/dL (ref 0.44–1.00)
Calcium: 8.6 mg/dL — ABNORMAL LOW (ref 8.9–10.3)
Chloride: 101 mmol/L (ref 101–111)
GFR calc non Af Amer: >60 mL/min (ref >60)
Glucose, Bld: 95 mg/dL (ref 65–99)
POTASSIUM: 3.7 mmol/L (ref 3.5–5.1)
SODIUM: 135 mmol/L (ref 135–145)

## 2018-01-15 LAB — CBC
HCT: 31.6 % — ABNORMAL LOW (ref 35.0–47.0)
Hemoglobin: 10.8 g/dL — ABNORMAL LOW (ref 12.0–16.0)
MCH: 27.5 pg (ref 26.0–34.0)
MCHC: 34.3 g/dL (ref 32.0–36.0)
MCV: 80.3 fL (ref 80.0–100.0)
PLATELETS: 244 10*3/uL (ref 150–440)
RBC: 3.93 MIL/uL (ref 3.80–5.20)
RDW: 18.7 % — AB (ref 11.5–14.5)
WBC: 5.1 10*3/uL (ref 3.6–11.0)

## 2018-01-15 MED ORDER — CLONAZEPAM 0.5 MG PO TABS: 0.5000 mg | ORAL_TABLET | Freq: Three times a day (TID) | ORAL | Status: DC | PRN

## 2018-01-15 MED ORDER — DIPHENHYDRAMINE HCL 25 MG PO CAPS
25.0000 mg | ORAL_CAPSULE | Freq: Four times a day (QID) | ORAL | Status: DC | PRN
  Administered 2018-01-15 (×2): 25 mg via ORAL
  Filled 2018-01-15 (×2): qty 1

## 2018-01-15 MED ORDER — PROCHLORPERAZINE EDISYLATE 10 MG/2ML IJ SOLN
5.0000 mg | INTRAMUSCULAR | Status: DC | PRN
  Administered 2018-01-15 – 2018-01-16 (×3): 5 mg via INTRAVENOUS
  Filled 2018-01-15 (×3): qty 1

## 2018-01-15 MED ORDER — HYDROMORPHONE HCL 1 MG/ML IJ SOLN
1.0000 mg | INTRAMUSCULAR | Status: DC | PRN
  Administered 2018-01-15 – 2018-01-16 (×4): 1 mg via INTRAVENOUS
  Filled 2018-01-15 (×4): qty 1

## 2018-01-15 NOTE — Consult Note (Signed)
[facility] Infectious Disease     Reason for Consult: C diff   Referring Physician: Serita Grit Date of Admission:  01/14/2018   Active Problems:   C. difficile colitis   HPI: Brittany Nguyen is a 52 y.o. female readmitted with diarrhea and bloody stools. 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  She has been following in gastroenterology for diarrhea and abdominal pain as well as abdominal cramping after meals. She has been having nausea and vomiting over the last few days. On admit C diff neg, Norovirus + on PCR. Feels a little better with IV Fluids   Past Medical History:  Diagnosis Date  . Asthma 2013  . CHF (congestive heart failure) ([place])   . Diverticulitis 2010  . DVT (deep venous thrombosis) ([place])   . Endometriosis 1990  . Heart disease 2013  . Hx MRSA infection   . Lump or mass in breast   . Restless leg    Past Surgical History:  Procedure Laterality Date  . ABDOMINAL HYSTERECTOMY     age 71  . CARDIAC DEFIBRILLATOR PLACEMENT    . CESAREAN SECTION    . CHOLECYSTECTOMY    . COLECTOMY    . NASAL SINUS SURGERY  2012   Social History   Tobacco Use  . Smoking status: Never Smoker  . Smokeless tobacco: Never Used  Substance Use Topics  . Alcohol use: No  . Drug use: No   Family History  Problem Relation Age of Onset  . Cancer Mother 30       ovarian  . CAD Mother   . Cancer Father 63       brain  . CAD Father   . Cancer Daughter 18       skin  . Cancer Maternal Aunt 34       breast    Allergies:  Allergies  Allergen Reactions  . Contrast Media [Iodinated Diagnostic Agents] Shortness Of Breath  . Metrizamide Shortness Of Breath  . Penicillins Hives and Other (See Comments)    Other reaction(s): Other (See Comments) Has patient had a PCN reaction causing immediate rash, facial/tongue/throat swelling, SOB or lightheadedness with hypotension: Yes Has patient had a PCN reaction causing severe rash involving  mucus membranes or skin necrosis: No Has patient had a PCN reaction that required hospitalization No Has patient had a PCN reaction occurring within the last 10 years: No If all of the above answers are "NO", then may proceed with Cephalosporin use. Has patient had a PCN reaction causing immediate rash, facial/tongue/throat swelling, SOB or lightheadedness with hypotension: Yes Has patient had a PCN reaction causing severe rash involving mucus membranes or skin necrosis: No Has patient had a PCN reaction that required hospitalization No Has patient had a PCN reaction occurring within the last 10 years: No If all of the above answers are "NO", then may proceed with Cephalosporin use.   . Isosorbide Nitrate     Other reaction(s): Headache  . Latex Hives  . Ondansetron Other (See Comments)    Severe headache  . Zofran [Ondansetron Hcl] Other (See Comments)    Causes migraines  . Povidone-Iodine Rash    Blistering rash  . Pulmicort [Budesonide] Itching    Current antibiotics: Antibiotics Given (last 72 hours)    Date/Time Action Medication Dose   01/14/18 2254 Given   fidaxomicin (DIFICID) tablet 200 mg 200 mg   01/15/18 0909 Given   fidaxomicin (DIFICID) tablet 200  mg 200 mg      MEDICATIONS: . budesonide (PULMICORT) nebulizer solution  0.25 mg Nebulization BID  . enoxaparin (LOVENOX) injection  40 mg Subcutaneous Q24H  . ferrous sulfate  325 mg Oral Q breakfast  . fidaxomicin  200 mg Oral BID  . losartan  12.5 mg Oral BID  . metoprolol succinate  100 mg Oral Daily  . multivitamin with minerals  1 tablet Oral Daily  . rOPINIRole  4 mg Oral QHS  . sertraline  50 mg Oral Daily  . spironolactone  50 mg Oral Daily  . vitamin B-12  500 mcg Oral Daily    Review of Systems - 11 systems reviewed and negative per HPI   OBJECTIVE: Temp:  [97.8 F (36.6 C)-98.5 F (36.9 C)] 98.2 F (36.8 C) (05/13 1434) Pulse Rate:  [80-115] 80 (05/13 1500) Resp:  [15-22] 15 (05/13 1434) BP:  (86-114)/(54-102) 101/66 (05/13 1500) SpO2:  [93 %-98 %] 95 % (05/13 1500) Physical Exam  Constitutional:  oriented to person, place, and time. appears well-developed and well-nourished. No distress.  HENT: [place]/AT, PERRLA, no scleral icterus Mouth/Throat: Oropharynx is clear and moist. No oropharyngeal exudate.  Cardiovascular: Normal rate, regular rhythm and normal heart sounds. Exam reveals no gallop and no friction rub.  No murmur heard.  Pulmonary/Chest: Effort normal and breath sounds normal. No respiratory distress.  has no wheezes.  Neck = supple, no nuchal rigidity Abdominal: Soft. Bowel sounds are normal.  exhibits no distension. There is no tenderness.  Lymphadenopathy: no cervical adenopathy. No axillary adenopathy Neurological: alert and oriented to person, place, and time.  Skin: Skin is warm and dry. No rash noted. No erythema.  Psychiatric: a normal mood and affect.  behavior is normal.    LABS: Results for orders placed or performed during the hospital encounter of 01/14/18 (from the past 48 hour(s))  Lipase, blood     Status: None   Collection Time: 01/14/18 11:16 AM  Result Value Ref Range   Lipase 39 11 - 51 U/L    Comment: Performed at [facility], [address] [place], [place] [address]  Comprehensive metabolic panel     Status: Abnormal   Collection Time: 01/14/18 11:16 AM  Result Value Ref Range   Sodium 135 135 - 145 mmol/L   Potassium 3.9 3.5 - 5.1 mmol/L   Chloride 99 (L) 101 - 111 mmol/L   CO2 25 22 - 32 mmol/L   Glucose, Bld 105 (H) 65 - 99 mg/dL   BUN 12 6 - 20 mg/dL   Creatinine, Ser 0.86 0.44 - 1.00 mg/dL   Calcium 9.4 8.9 - 10.3 mg/dL   Total Protein 8.0 6.5 - 8.1 g/dL   Albumin 3.8 3.5 - 5.0 g/dL   AST 34 15 - 41 U/L   ALT 26 14 - 54 U/L   Alkaline Phosphatase 102 38 - 126 U/L   Total Bilirubin 0.7 0.3 - 1.2 mg/dL   GFR calc non Af Amer >60 >60 mL/min   GFR calc Af Amer >60 >60 mL/min    Comment: (NOTE) The eGFR has been  calculated using the CKD EPI equation. This calculation has not been validated in all clinical situations. eGFR's persistently <60 mL/min signify possible Chronic Kidney Disease.    Anion gap 11 5 - 15    Comment: Performed at [facility], [address] [place], [place] [address]  CBC     Status: Abnormal   Collection Time: 01/14/18 11:16 AM  Result Value Ref Range   WBC 17.6 (H) 3.6 - 11.0 K/uL   RBC 4.82 3.80 - 5.20 MIL/uL   Hemoglobin 12.6 12.0 - 16.0 g/dL   HCT 38.4 35.0 - 47.0 %   MCV 79.6 (L) 80.0 - 100.0 fL   MCH 26.2 26.0 - 34.0 pg   MCHC 32.9 32.0 - 36.0 g/dL   RDW 19.0 (H) 11.5 - 14.5 %   Platelets 430 150 - 440 K/uL    Comment: Performed at [facility], [address] [place], [place] [address]  Troponin I     Status: None   Collection Time: 01/14/18 11:16 AM  Result Value Ref Range   Troponin I <0.03 <0.03 ng/mL    Comment: Performed at [facility], [address] [place], [place] [address]  Urinalysis, Complete w Microscopic     Status: Abnormal   Collection Time: 01/14/18 11:32 AM  Result Value Ref Range   Color, Urine AMBER (A) YELLOW    Comment: BIOCHEMICALS MAY BE AFFECTED BY COLOR   APPearance CLOUDY (A) CLEAR   Specific Gravity, Urine 1.021 1.005 - 1.030   pH 7.0 5.0 - 8.0   Glucose, UA NEGATIVE NEGATIVE mg/dL   Hgb urine dipstick NEGATIVE NEGATIVE   Bilirubin Urine NEGATIVE NEGATIVE   Ketones, ur NEGATIVE NEGATIVE mg/dL   Protein, ur 30 (A) NEGATIVE mg/dL   Nitrite NEGATIVE NEGATIVE   Leukocytes, UA NEGATIVE NEGATIVE   RBC / HPF 0-5 0 - 5 RBC/hpf   WBC, UA 0-5 0 - 5 WBC/hpf   Bacteria, UA NONE SEEN NONE SEEN   Squamous Epithelial / LPF >50 (H) 0 - 5   Mucus PRESENT     Comment: Performed at [facility], [address] [place], [place] [address]  C difficile quick scan w PCR reflex     Status: None   Collection Time: 01/14/18 11:32 AM  Result Value Ref Range   C Diff antigen NEGATIVE NEGATIVE   C  Diff toxin NEGATIVE NEGATIVE   C Diff interpretation No C. difficile detected.     Comment: Performed at [facility], [address] [place],  [address]  Gastrointestinal Panel by PCR , Stool     Status: Abnormal   Collection Time: 01/14/18 11:32 AM  Result Value Ref Range   Campylobacter species NOT DETECTED NOT DETECTED   Plesimonas shigelloides NOT DETECTED NOT DETECTED   Salmonella species NOT DETECTED NOT DETECTED   Yersinia enterocolitica NOT DETECTED NOT DETECTED   Vibrio species NOT DETECTED NOT DETECTED   Vibrio cholerae NOT DETECTED NOT DETECTED   Enteroaggregative E coli (EAEC) NOT DETECTED NOT DETECTED   Enteropathogenic E coli (EPEC) NOT DETECTED NOT DETECTED   Enterotoxigenic E coli (ETEC) NOT DETECTED NOT DETECTED   Shiga like toxin producing E coli (STEC) NOT DETECTED NOT DETECTED   Shigella/Enteroinvasive E coli (EIEC) NOT DETECTED NOT DETECTED   Cryptosporidium NOT DETECTED NOT DETECTED   Cyclospora cayetanensis NOT DETECTED NOT DETECTED   Entamoeba histolytica NOT DETECTED NOT DETECTED   Giardia lamblia NOT DETECTED NOT DETECTED   Adenovirus F40/41 NOT DETECTED NOT DETECTED   Astrovirus NOT DETECTED NOT DETECTED   Norovirus GI/GII DETECTED (A) NOT DETECTED    Comment: RESULT CALLED TO, READ BACK BY AND VERIFIED WITH: BRANDY DAVIS '@1647'$  01/14/18 AKT    Rotavirus A NOT DETECTED NOT DETECTED   Sapovirus (I, II, IV, and V) NOT DETECTED NOT DETECTED    Comment: Performed at [facility], [address] [place], [place] [address]  Basic metabolic panel     Status: Abnormal   Collection Time: 01/15/18  5:13 AM  Result Value Ref Range   Sodium 135 135 - 145 mmol/L   Potassium 3.7 3.5 - 5.1 mmol/L   Chloride 101 101 - 111 mmol/L   CO2 28 22 - 32 mmol/L   Glucose, Bld 95 65 - 99 mg/dL   BUN 11 6 - 20 mg/dL   Creatinine, Ser 0.75 0.44 - 1.00 mg/dL   Calcium 8.6 (L) 8.9 - 10.3 mg/dL   GFR calc non Af Amer >60 >60 mL/min   GFR calc Af Amer  >60 >60 mL/min    Comment: (NOTE) The eGFR has been calculated using the CKD EPI equation. This calculation has not been validated in all clinical situations. eGFR's persistently <60 mL/min signify possible Chronic Kidney Disease.    Anion gap 6 5 - 15    Comment: Performed at [facility], [address] [place], [place] [address]  CBC     Status: Abnormal   Collection Time: 01/15/18  5:13 AM  Result Value Ref Range   WBC 5.1 3.6 - 11.0 K/uL   RBC 3.93 3.80 - 5.20 MIL/uL   Hemoglobin 10.8 (L) 12.0 - 16.0 g/dL   HCT 31.6 (L) 35.0 - 47.0 %   MCV 80.3 80.0 - 100.0 fL   MCH 27.5 26.0 - 34.0 pg   MCHC 34.3 32.0 - 36.0 g/dL   RDW 18.7 (H) 11.5 - 14.5 %   Platelets 244 150 - 440 K/uL    Comment: Performed at [facility], [address] [place], [place] [address]   No components found for: ESR, C REACTIVE PROTEIN MICRO: Recent Results (from the past 720 hour(s))  Calprotectin, Fecal     Status: Abnormal   Collection Time: 12/29/17 11:45 AM  Result Value Ref Range Status   Calprotectin, Fecal 320 (H) 0 - 120 ug/g Final    Comment: (NOTE) **Results verified by repeat testing** Concentration     Interpretation   Follow-Up <16 - 50 ug/g     Normal           None >50 -120 ug/g     Borderline       Re-evaluate in 4-6 weeks    >120 ug/g     Abnormal         Repeat as clinically                                   indicated Performed At: [facility] [place], [place] [id number] Rush Farmer MD VA:[id number] Performed at [facility], [address] [place], [place] [address]   C difficile quick scan w PCR reflex     Status: Abnormal   Collection Time: 01/05/18  9:45 AM  Result Value Ref Range Status   C Diff antigen POSITIVE (A) NEGATIVE Final   C Diff toxin NEGATIVE NEGATIVE Final   C Diff interpretation Results are indeterminate. See PCR results.  Final    Comment: Performed at [facility], [address]  [place], [place] [address]  Gastrointestinal Panel by PCR , Stool     Status: None   Collection Time: 01/05/18  9:45 AM  Result Value Ref Range Status   Campylobacter species NOT DETECTED NOT DETECTED Final   Plesimonas shigelloides NOT DETECTED NOT DETECTED  Final   Salmonella species NOT DETECTED NOT DETECTED Final   Yersinia enterocolitica NOT DETECTED NOT DETECTED Final   Vibrio species NOT DETECTED NOT DETECTED Final   Vibrio cholerae NOT DETECTED NOT DETECTED Final   Enteroaggregative E coli (EAEC) NOT DETECTED NOT DETECTED Final   Enteropathogenic E coli (EPEC) NOT DETECTED NOT DETECTED Final   Enterotoxigenic E coli (ETEC) NOT DETECTED NOT DETECTED Final   Shiga like toxin producing E coli (STEC) NOT DETECTED NOT DETECTED Final   Shigella/Enteroinvasive E coli (EIEC) NOT DETECTED NOT DETECTED Final   Cryptosporidium NOT DETECTED NOT DETECTED Final   Cyclospora cayetanensis NOT DETECTED NOT DETECTED Final   Entamoeba histolytica NOT DETECTED NOT DETECTED Final   Giardia lamblia NOT DETECTED NOT DETECTED Final   Adenovirus F40/41 NOT DETECTED NOT DETECTED Final   Astrovirus NOT DETECTED NOT DETECTED Final   Norovirus GI/GII NOT DETECTED NOT DETECTED Final   Rotavirus A NOT DETECTED NOT DETECTED Final   Sapovirus (I, II, IV, and V) NOT DETECTED NOT DETECTED Final    Comment: Performed at [facility], [address] [place], [place] [address]  C. Diff by PCR, Reflexed     Status: Abnormal   Collection Time: 01/05/18  9:45 AM  Result Value Ref Range Status   Toxigenic C. Difficile by PCR POSITIVE (A) NEGATIVE Final    Comment: Positive for toxigenic C. difficile with little to no toxin production. Only treat if clinical presentation suggests symptomatic illness. Performed at [facility], [address] [place], [place] [address]   C difficile quick scan w PCR reflex     Status: None   Collection Time: 01/14/18 11:32 AM  Result Value Ref Range Status   C Diff  antigen NEGATIVE NEGATIVE Final   C Diff toxin NEGATIVE NEGATIVE Final   C Diff interpretation No C. difficile detected.  Final    Comment: Performed at [facility], [address] [place], [place] [address]  Gastrointestinal Panel by PCR , Stool     Status: Abnormal   Collection Time: 01/14/18 11:32 AM  Result Value Ref Range Status   Campylobacter species NOT DETECTED NOT DETECTED Final   Plesimonas shigelloides NOT DETECTED NOT DETECTED Final   Salmonella species NOT DETECTED NOT DETECTED Final   Yersinia enterocolitica NOT DETECTED NOT DETECTED Final   Vibrio species NOT DETECTED NOT DETECTED Final   Vibrio cholerae NOT DETECTED NOT DETECTED Final   Enteroaggregative E coli (EAEC) NOT DETECTED NOT DETECTED Final   Enteropathogenic E coli (EPEC) NOT DETECTED NOT DETECTED Final   Enterotoxigenic E coli (ETEC) NOT DETECTED NOT DETECTED Final   Shiga like toxin producing E coli (STEC) NOT DETECTED NOT DETECTED Final   Shigella/Enteroinvasive E coli (EIEC) NOT DETECTED NOT DETECTED Final   Cryptosporidium NOT DETECTED NOT DETECTED Final   Cyclospora cayetanensis NOT DETECTED NOT DETECTED Final   Entamoeba histolytica NOT DETECTED NOT DETECTED Final   Giardia lamblia NOT DETECTED NOT DETECTED Final   Adenovirus F40/41 NOT DETECTED NOT DETECTED Final   Astrovirus NOT DETECTED NOT DETECTED Final   Norovirus GI/GII DETECTED (A) NOT DETECTED Final    Comment: RESULT CALLED TO, READ BACK BY AND VERIFIED WITH: BRANDY DAVIS '@1647'$  01/14/18 AKT    Rotavirus A NOT DETECTED NOT DETECTED Final   Sapovirus (I, II, IV, and V) NOT DETECTED NOT DETECTED Final    Comment: Performed at [facility], [address] [place], [place] [address]    IMAGING: Ct Abdomen  Pelvis Wo Contrast  Result Date: 01/14/2018 CLINICAL DATA:  52 year old female with acute abdominal and pelvic pain and nausea. EXAM: CT ABDOMEN AND PELVIS WITHOUT CONTRAST TECHNIQUE: Multidetector CT imaging of the  abdomen and pelvis was performed following the standard protocol without IV contrast. COMPARISON:  01/09/2018 and prior CTs FINDINGS: Please note that parenchymal abnormalities may be missed without intravenous contrast. Lower chest: No acute abnormality. Cardiac defibrillator lead is identified. Hepatobiliary: Mild hepatic steatosis noted. The patient is status post cholecystectomy. No biliary dilatation. Pancreas: Unremarkable. Spleen: Unremarkable Adrenals/Urinary Tract: The kidneys, adrenal glands and bladder are unremarkable. Stomach/Bowel: The stomach is unchanged with a stable 2.4 cm fatty lesion within the wall of the gastric greater curvature. No evidence of new bowel wall thickening, distention, or inflammatory changes. Distal colonic surgical changes again identified. Vascular/Lymphatic: No significant vascular findings are present. No enlarged abdominal or pelvic lymph nodes. Reproductive: Status post hysterectomy. No adnexal masses. Other: No ascites, focal collection or pneumoperitoneum. A small stable infraumbilical midline ventral hernia within the UPPER pelvis containing only fat is again noted. Musculoskeletal: No acute or suspicious bony abnormalities. IMPRESSION: 1. No evidence of acute abnormality. No findings to suggest a cause for this patient's abdominal pain. 2. Mild hepatic steatosis. Electronically Signed   By: Margarette Canada M.D.   On: 01/14/2018 14:25   Ct Abdomen Pelvis Wo Contrast  Result Date: 01/09/2018 CLINICAL DATA:  52 year old female with history of diverticulitis and endometriosis presenting with lower abdominal cramping and diarrhea with dark and bright red blood ongoing for the past 2 days. Followed for recurrent C difficile colitis. Stool positive for C difficile despite vancomycin treatment. Prior cholecystectomy, hysterectomy and colectomy. Initial encounter. EXAM: CT ABDOMEN AND PELVIS WITHOUT CONTRAST TECHNIQUE: Multidetector CT imaging of the abdomen and pelvis was  performed following the standard protocol without IV contrast. COMPARISON:  02/01/2016 CT. FINDINGS: Lower chest: Stable left base 2.5 mm nodule. AICD leads in the region of the right atrium and right ventricle. Heart size top-normal. Hepatobiliary: Mild fatty infiltration of liver. Taking into account limitation by non contrast imaging, no focal worrisome hepatic lesion. Post cholecystectomy. No calcified common bile duct stone. Pancreas: Taking into account limitation by non contrast imaging, no worrisome pancreatic mass or infiltration. Spleen: Top normal size spleen. Taking into account limitation by non contrast imaging, no focal splenic lesion. Adrenals/Urinary Tract: No obstructing stone or hydronephrosis. Taking into account limitation by non contrast imaging, no worrisome renal adrenal or bladder lesion. Stomach/Bowel: Post sigmoid resection. No colonic inflammatory process identified. Scattered diverticula. Appendix not visualized and may been surgically removed. No small bowel abnormality noted. 2.4 cm fatty lesion suspected within the anterior inferior wall of the gastric body/antrum junction (series 2, image 33, series 6, image 77 and series 5, image 27). Vascular/Lymphatic: No aortic aneurysm.  No adenopathy. Reproductive: Post hysterectomy.  No adnexal mass noted. Other: Small fat containing hernia infraumbilical region with rent measuring 1.1 x 0.9 cm and herniated fat measuring up to 3.2 cm. No free intraperitoneal air. Musculoskeletal: No worrisome osseous abnormality. Mild degenerative changes most notable L5-S1. IMPRESSION: Post sigmoid resection. No colonic inflammatory process identified. Scattered diverticula. 2.4 cm fatty lesion suspected within the anterior inferior wall of the gastric body/antrum junction (series 2, image 33, series 6, image 77 and series 5, image 27). Post hysterectomy and cholecystectomy. Small fat containing hernia infraumbilical region. AICD in place. Mild fatty  infiltration liver. Electronically Signed   By: Genia Del M.D.   On: 01/09/2018 11:01  Dg Abdomen 1 View  Result Date: 01/14/2018 CLINICAL DATA:  C difficile colitis, persistent diarrhea, syncopal episode this morning, lower abdominal pain and nausea EXAM: ABDOMEN - 1 VIEW COMPARISON:  CT abdomen and pelvis 01/09/2018 FINDINGS: Food debris in stomach. Surgical clips RIGHT upper quadrant consistent with cholecystectomy. Retained contrast within a diverticulum at the ascending colon. Normal bowel gas pattern without bowel dilatation or definite bowel wall thickening. Bones demineralized. Numerous pelvic phleboliths. IMPRESSION: Unremarkable abdominal radiograph. Electronically Signed   By: Lavonia Dana M.D.   On: 01/14/2018 12:22    Assessment:   Laree Garron is a 52 y.o. female being treated for recurrent C diff with recurrent sxs of vomiting diarrhea and crampy abd pain. Her C diff testing is now negative and norovirus PCR +. Thinks she may have acquired it from her grandkids.   Recommendations Cont supportive care with IVF and antiemetics Cont fidaxomicin 200 BID to finish a total 10 day course Proceed with further wu per GI  Thank you very much for allowing me to participate in the care of this patient. Please call with questions.   Cheral Marker. Ola Spurr, MD

## 2018-01-15 NOTE — Consult Note (Signed)
[name], MD [address]  [place]  [place], [place] [address]  Main: [phone]  Fax: [phone] Pager: [phone]   Consultation  Referring Provider:     No ref. provider found Primary Care Physician:  [name], MD Primary Gastroenterologist:  Dr. Sherri Sear         Reason for Consultation:     Recurrent C. Difficile infection  Date of Admission:  01/14/2018 Date of Consultation:  01/15/2018         HPI:   Brittany Nguyen is a 52 y.o. Caucasian female readmitted with severe nonbloody diarrhea. Patient had C. Difficile infection on 12/08/2017 and 01/05/2018. She was treated with 2 weeks course of oral vancomycin. She noticed that after finishing oral vancomycin, started experiencing severe watery diarrhea up to 15-20 times per day on Friday, decreased to 12 times per day. Stool studies were performed which were negative for C. Difficile, positive for norovirus on 01/14/2018. She was empirically started on dificid due to high risk for recurrence of C. Difficile. Infectious disease is consulted and GI is consulted possible stool transplant evaluation. Patient reports that she had only 3 bowel movements today with improved consistency. She is tolerating by mouth well.   NSAIDs: none  Antiplts/Anticoagulants/Anti thrombotics: none  GI Procedures: none  Past Medical History:  Diagnosis Date  . Asthma 2013  . CHF (congestive heart failure) ([place])   . Diverticulitis 2010  . DVT (deep venous thrombosis) ([place])   . Endometriosis 1990  . Heart disease 2013  . Hx MRSA infection   . Lump or mass in breast   . Restless leg     Past Surgical History:  Procedure Laterality Date  . ABDOMINAL HYSTERECTOMY     age 64  . CARDIAC DEFIBRILLATOR PLACEMENT    . CESAREAN SECTION    . CHOLECYSTECTOMY    . COLECTOMY    . NASAL SINUS SURGERY  2012    Prior to Admission medications   Medication Sig Start Date End Date Taking? Authorizing Provider    acetaminophen (TYLENOL) 325 MG tablet Take 2 tablets (650 mg total) by mouth every 6 (six) hours as needed for mild pain (or Fever >/= 101). 11/01/17  Yes Gouru, Illene Silver, MD  albuterol (ACCUNEB) 1.25 MG/3ML nebulizer solution Take 3 mLs by nebulization every 6 (six) hours as needed for wheezing. 11/09/17  Yes [provider]  beclomethasone (QVAR) 80 MCG/ACT inhaler Inhale 2 puffs into the lungs 2 (two) times daily. 06/05/17 06/05/18 Yes Laverle Hobby, MD  cyanocobalamin 500 MCG tablet Take 500 mcg by mouth daily.   Yes [provider]  dicyclomine (BENTYL) 10 MG capsule Take 10 mg by mouth 3 (three) times daily as needed (abdominal pain).  12/29/17  Yes [provider]  ferrous sulfate 325 (65 FE) MG tablet Take 325 mg by mouth daily with breakfast.   Yes [provider]  HYDROcodone-acetaminophen (NORCO/VICODIN) 5-325 MG tablet Take 1-2 tablets by mouth every 4 (four) hours as needed for moderate pain. 01/11/18  Yes Dustin Flock, MD  losartan (COZAAR) 25 MG tablet Take 12.5 mg by mouth 2 (two) times daily.   Yes [provider]  metoprolol succinate (TOPROL-XL) 50 MG 24 hr tablet Take 1 tablet (50 mg total) by mouth daily. Take with or immediately following a meal. Patient taking differently: Take 150 mg by mouth daily. Take with or immediately following a meal. 09/27/17  Yes Loletha Grayer, MD  Multiple Vitamin (MULTIVITAMIN WITH  MINERALS) TABS tablet Take 1 tablet by mouth daily.   Yes [provider]  nitroGLYCERIN (NITROSTAT) 0.4 MG SL tablet Place 1 tablet (0.4 mg total) under the tongue every 5 (five) minutes as needed for chest pain. 01/24/17  Yes Henreitta Leber, MD  pantoprazole (PROTONIX) 40 MG tablet Take 40 mg by mouth 2 (two) times daily. 01/02/18  Yes [provider]  promethazine (PHENERGAN) 6.25 MG/5ML syrup Take 10 mLs (12.5 mg total) by mouth every 6 (six) hours as needed for nausea or vomiting. 01/11/18 01/11/19 Yes  Dustin Flock, MD  rOPINIRole (REQUIP) 2 MG tablet Take 1 tablet (2 mg total) by mouth at bedtime. 2-4  Mg per night Patient taking differently: Take 4 mg by mouth at bedtime.  04/26/17  Yes Mody, Ulice Bold, MD  sertraline (ZOLOFT) 50 MG tablet Take 50 mg by mouth daily.   Yes [provider]  spironolactone (ALDACTONE) 50 MG tablet Take 50 mg by mouth daily.   Yes [provider]  traMADol (ULTRAM) 50 MG tablet Take 1 tablet (50 mg total) by mouth every 6 (six) hours as needed. 11/01/17  Yes Gouru, Illene Silver, MD  chlorpheniramine-HYDROcodone (TUSSIONEX) 10-8 MG/5ML SUER Take 5 mLs by mouth every 12 (twelve) hours as needed for cough. Patient not taking: Reported on 01/08/2018 11/01/17   Nicholes Mango, MD  fidaxomicin (DIFICID) 200 MG TABS tablet Take 1 tablet (200 mg total) by mouth 2 (two) times daily for 10 days. Patient not taking: Reported on 01/14/2018 01/11/18 01/21/18  Dustin Flock, MD    Family History  Problem Relation Age of Onset  . Cancer Mother 30       ovarian  . CAD Mother   . Cancer Father 71       brain  . CAD Father   . Cancer Daughter 18       skin  . Cancer Maternal Aunt 46       breast     Social History   Tobacco Use  . Smoking status: Never Smoker  . Smokeless tobacco: Never Used  Substance Use Topics  . Alcohol use: No  . Drug use: No    Allergies as of 01/14/2018 - Review Complete 01/14/2018  Allergen Reaction Noted  . Contrast media [iodinated diagnostic agents] Shortness Of Breath 11/23/2012  . Metrizamide Shortness Of Breath 11/23/2012  . Penicillins Hives and Other (See Comments) 01/20/2009  . Isosorbide nitrate  01/08/2013  . Latex Hives 01/20/2009  . Ondansetron Other (See Comments) 07/20/2015  . Zofran [ondansetron hcl] Other (See Comments) 01/09/2018  . Povidone-iodine Rash 01/20/2009  . Pulmicort [budesonide] Itching 01/23/2017    Review of Systems:    All systems reviewed and negative except where noted in HPI.   Physical  Exam:  Vital signs in last 24 hours: Temp:  [97.8 F (36.6 C)-98.5 F (36.9 C)] 98 F (36.7 C) (05/13 2038) Pulse Rate:  [80-115] 89 (05/13 2038) Resp:  [15-22] 20 (05/13 2038) BP: (86-114)/(54-102) 113/83 (05/13 2038) SpO2:  [93 %-98 %] 94 % (05/13 2038) Last BM Date: 01/14/18 General:   Pleasant, cooperative in NAD Head:  Normocephalic and atraumatic. Eyes:   No icterus.   Conjunctiva pink. PERRLA. Ears:  Normal auditory acuity. Neck:  Supple; no masses or thyroidomegaly Lungs: Respirations even and unlabored. Lungs clear to auscultation bilaterally.   No wheezes, crackles, or rhonchi.  Heart:  Regular rate and rhythm;  Without murmur, clicks, rubs or gallops Abdomen:  Soft, nondistended, nontender. Normal bowel sounds.  No appreciable masses or hepatomegaly.  No rebound or guarding.  Rectal:  Not performed. Msk:  Symmetrical without gross deformities.  Strength normal  Extremities:  Without edema, cyanosis or clubbing. Neurologic:  Alert and oriented x3;  grossly normal neurologically. Skin:  Intact without significant lesions or rashes. Psych:  Alert and cooperative. Normal affect.  LAB RESULTS: CBC Latest Ref Rng & Units 01/15/2018 01/14/2018 01/11/2018  WBC 3.6 - 11.0 K/uL 5.1 17.6(H) -  Hemoglobin 12.0 - 16.0 g/dL 10.8(L) 12.6 10.6(L)  Hematocrit 35.0 - 47.0 % 31.6(L) 38.4 -  Platelets 150 - 440 K/uL 244 430 -    BMET BMP Latest Ref Rng & Units 01/15/2018 01/14/2018 01/11/2018  Glucose 65 - 99 mg/dL 95 105(H) 112(H)  BUN 6 - 20 mg/dL _0 Creatinine 0.44 - 1.00 mg/dL 0.75 0.86 0.81  Sodium 135 - 145 mmol/L 135 135 141  Potassium 3.5 - 5.1 mmol/L 3.7 3.9 3.3(L)  Chloride 101 - 111 mmol/L 101 99(L) 106  CO2 22 - 32 mmol/L _1 Calcium 8.9 - 10.3 mg/dL 8.6(L) 9.4 8.4(L)    LFT Hepatic Function Latest Ref Rng & Units 01/14/2018 01/08/2018 10/29/2017  Total Protein 6.5 - 8.1 g/dL 8.0 7.8 8.3(H)  Albumin 3.5 - 5.0 g/dL 3.8 3.6 4.1  AST 15 - 41 U/L 34 26 37  ALT 14 - 54  U/L _2 Alk Phosphatase 38 - 126 U/L 102 104 129(H)  Total Bilirubin 0.3 - 1.2 mg/dL 0.7 0.4 0.6  Bilirubin, Direct 0.1 - 0.5 mg/dL - - -     STUDIES: Ct Abdomen Pelvis Wo Contrast  Result Date: 01/14/2018 CLINICAL DATA:  52 year old female with acute abdominal and pelvic pain and nausea. EXAM: CT ABDOMEN AND PELVIS WITHOUT CONTRAST TECHNIQUE: Multidetector CT imaging of the abdomen and pelvis was performed following the standard protocol without IV contrast. COMPARISON:  01/09/2018 and prior CTs FINDINGS: Please note that parenchymal abnormalities may be missed without intravenous contrast. Lower chest: No acute abnormality. Cardiac defibrillator lead is identified. Hepatobiliary: Mild hepatic steatosis noted. The patient is status post cholecystectomy. No biliary dilatation. Pancreas: Unremarkable. Spleen: Unremarkable Adrenals/Urinary Tract: The kidneys, adrenal glands and bladder are unremarkable. Stomach/Bowel: The stomach is unchanged with a stable 2.4 cm fatty lesion within the wall of the gastric greater curvature. No evidence of new bowel wall thickening, distention, or inflammatory changes. Distal colonic surgical changes again identified. Vascular/Lymphatic: No significant vascular findings are present. No enlarged abdominal or pelvic lymph nodes. Reproductive: Status post hysterectomy. No adnexal masses. Other: No ascites, focal collection or pneumoperitoneum. A small stable infraumbilical midline ventral hernia within the UPPER pelvis containing only fat is again noted. Musculoskeletal: No acute or suspicious bony abnormalities. IMPRESSION: 1. No evidence of acute abnormality. No findings to suggest a cause for this patient's abdominal pain. 2. Mild hepatic steatosis. Electronically Signed   By: Margarette Canada M.D.   On: 01/14/2018 14:25   Dg Abdomen 1 View  Result Date: 01/14/2018 CLINICAL DATA:  C difficile colitis, persistent diarrhea, syncopal episode this morning, lower abdominal  pain and nausea EXAM: ABDOMEN - 1 VIEW COMPARISON:  CT abdomen and pelvis 01/09/2018 FINDINGS: Food debris in stomach. Surgical clips RIGHT upper quadrant consistent with cholecystectomy. Retained contrast within a diverticulum at the ascending colon. Normal bowel gas pattern without bowel dilatation or definite bowel wall thickening. Bones demineralized. Numerous pelvic phleboliths. IMPRESSION: Unremarkable abdominal radiograph. Electronically Signed   By: Lavonia Dana M.D.   On:  01/14/2018 12:22      Impression / Plan:   Brittany Nguyen is a 52 y.o. Caucasian female with obesity, recurrent C. Difficile infection treated with oral vancomycin 2, readmitted with severe diarrhea and was found to have Norovirus and negative for C. Difficile infection.  - Complete 10 days course of Dificid per ID - Recommend to start probiotics upon discharge - We will evaluate for stool transplant if patient develops second recurrence of Clostridium difficile infection  - she will contact my office if diarrhea recurs  Thank you for involving me in the care of this patient.  Will sign off for now Please call us back with questions    LOS: 1 day   Sherri Sear, MD  01/15/2018, 8:40 PM   Note: This dictation was prepared with Dragon dictation along with smaller phrase technology. Any transcriptional errors that result from this process are unintentional.

## 2018-01-15 NOTE — Progress Notes (Signed)
Patient ID: Brittany Nguyen, female   DOB: Apr 25, 1966, 52 y.o.   MRN: [id number]  [facility] PROGRESS NOTE  Brittany Nguyen HGD:[id number] DOB: 06/16/1966 DOA: 01/14/2018 PCP: [name], MD  HPI/Subjective: Patient stating she is having a lot of pain all over.  Complains of body aches.  Positive for abdominal pain.  Recently on treatment for C. difficile colitis.  Objective: Vitals:   01/15/18 1436 01/15/18 1500  BP: (!) 114/102 101/66  Pulse: 83 80  Resp:    Temp:    SpO2:  95%    Filed Weights   01/14/18 1115  Weight: 99.8 kg (220 lb)    ROS: Review of Systems  Constitutional: Negative for chills and fever.  Eyes: Negative for blurred vision.  Respiratory: Negative for cough and shortness of breath.   Cardiovascular: Negative for chest pain.  Gastrointestinal: Positive for abdominal pain and nausea. Negative for constipation, diarrhea and vomiting.  Genitourinary: Negative for dysuria.  Musculoskeletal: Positive for joint pain.  Neurological: Negative for dizziness and headaches.   Exam: Physical Exam  Constitutional: She is oriented to person, place, and time.  HENT:  Nose: No mucosal edema.  Mouth/Throat: No oropharyngeal exudate or posterior oropharyngeal edema.  Eyes: Pupils are equal, round, and reactive to light. Conjunctivae, EOM and lids are normal.  Neck: No JVD present. Carotid bruit is not present. No edema present. No thyroid mass and no thyromegaly present.  Cardiovascular: S1 normal and S2 normal. Exam reveals no gallop.  No murmur heard. Pulses:      Dorsalis pedis pulses are 2+ on the right side, and 2+ on the left side.  Respiratory: No respiratory distress. She has no wheezes. She has no rhonchi. She has no rales.  GI: Soft. Bowel sounds are normal. There is tenderness.  Musculoskeletal:       Right ankle: She exhibits no swelling.       Left ankle: She exhibits no swelling.  Lymphadenopathy:    She has no cervical adenopathy.   Neurological: She is alert and oriented to person, place, and time. No cranial nerve deficit.  Skin: Skin is warm. No rash noted. Nails show no clubbing.  Psychiatric: She has a normal mood and affect.      Data Reviewed: Basic Metabolic Panel: Recent Labs  Lab 01/09/18 0536 01/09/18 1125 01/10/18 0521 01/11/18 0426 01/14/18 1116 01/15/18 0513  NA 140  --  142 141 135 135  K 2.8* 3.4* 3.8 3.3* 3.9 3.7  CL 97*  --  105 106 99* 101  CO2 34*  --  30 27 25 28   GLUCOSE 97  --  125* 112* 105* 95  BUN 10  --  <5* 8 12 11   CREATININE 1.03*  --  0.85 0.81 0.86 0.75  CALCIUM 8.7*  --  8.8* 8.4* 9.4 8.6*  MG  --  2.1  --  2.0  --   --   PHOS  --   --   --  4.6  --   --    Liver Function Tests: Recent Labs  Lab 01/08/18 1859 01/14/18 1116  AST 26 34  ALT 17 26  ALKPHOS 104 102  BILITOT 0.4 0.7  PROT 7.8 8.0  ALBUMIN 3.6 3.8   Recent Labs  Lab 01/08/18 1859 01/14/18 1116  LIPASE 38 39   CBC: Recent Labs  Lab 01/08/18 1859 01/09/18 0536 01/10/18 0521 01/11/18 0426 01/14/18 1116 01/15/18 0513  WBC 10.5 8.9 8.0  --  17.6* 5.1  HGB 11.6* 10.5* 11.1* 10.6* 12.6 10.8*  HCT 33.7* 30.9* 33.5*  --  38.4 31.6*  MCV 78.3* 78.5* 80.5  --  79.6* 80.3  PLT 322 279 284  --  430 244   Cardiac Enzymes: Recent Labs  Lab 01/14/18 1116  TROPONINI <0.03   BNP (last 3 results) Recent Labs    04/24/17 2022 09/24/17 0728 10/29/17 2103  BNP 198.0* 34.0 63.0     CBG: Recent Labs  Lab 01/09/18 0743 01/10/18 0823 01/11/18 0805  GLUCAP 91 124* 100*    Recent Results (from the past 240 hour(s))  C difficile quick scan w PCR reflex     Status: None   Collection Time: 01/14/18 11:32 AM  Result Value Ref Range Status   C Diff antigen NEGATIVE NEGATIVE Final   C Diff toxin NEGATIVE NEGATIVE Final   C Diff interpretation No C. difficile detected.  Final    Comment: Performed at [facility], [address] [place], [place] [address]  Gastrointestinal Panel  by PCR , Stool     Status: Abnormal   Collection Time: 01/14/18 11:32 AM  Result Value Ref Range Status   Campylobacter species NOT DETECTED NOT DETECTED Final   Plesimonas shigelloides NOT DETECTED NOT DETECTED Final   Salmonella species NOT DETECTED NOT DETECTED Final   Yersinia enterocolitica NOT DETECTED NOT DETECTED Final   Vibrio species NOT DETECTED NOT DETECTED Final   Vibrio cholerae NOT DETECTED NOT DETECTED Final   Enteroaggregative E coli (EAEC) NOT DETECTED NOT DETECTED Final   Enteropathogenic E coli (EPEC) NOT DETECTED NOT DETECTED Final   Enterotoxigenic E coli (ETEC) NOT DETECTED NOT DETECTED Final   Shiga like toxin producing E coli (STEC) NOT DETECTED NOT DETECTED Final   Shigella/Enteroinvasive E coli (EIEC) NOT DETECTED NOT DETECTED Final   Cryptosporidium NOT DETECTED NOT DETECTED Final   Cyclospora cayetanensis NOT DETECTED NOT DETECTED Final   Entamoeba histolytica NOT DETECTED NOT DETECTED Final   Giardia lamblia NOT DETECTED NOT DETECTED Final   Adenovirus F40/41 NOT DETECTED NOT DETECTED Final   Astrovirus NOT DETECTED NOT DETECTED Final   Norovirus GI/GII DETECTED (A) NOT DETECTED Final    Comment: RESULT CALLED TO, READ BACK BY AND VERIFIED WITH: BRANDY DAVIS @1647  01/14/18 AKT    Rotavirus A NOT DETECTED NOT DETECTED Final   Sapovirus (I, II, IV, and V) NOT DETECTED NOT DETECTED Final    Comment: Performed at [facility], [address], [place], [place] [address]     Studies: Ct Abdomen Pelvis Wo Contrast  Result Date: 01/14/2018 CLINICAL DATA:  52 year old female with acute abdominal and pelvic pain and nausea. EXAM: CT ABDOMEN AND PELVIS WITHOUT CONTRAST TECHNIQUE: Multidetector CT imaging of the abdomen and pelvis was performed following the standard protocol without IV contrast. COMPARISON:  01/09/2018 and prior CTs FINDINGS: Please note that parenchymal abnormalities may be missed without intravenous contrast. Lower chest: No acute  abnormality. Cardiac defibrillator lead is identified. Hepatobiliary: Mild hepatic steatosis noted. The patient is status post cholecystectomy. No biliary dilatation. Pancreas: Unremarkable. Spleen: Unremarkable Adrenals/Urinary Tract: The kidneys, adrenal glands and bladder are unremarkable. Stomach/Bowel: The stomach is unchanged with a stable 2.4 cm fatty lesion within the wall of the gastric greater curvature. No evidence of new bowel wall thickening, distention, or inflammatory changes. Distal colonic surgical changes again identified. Vascular/Lymphatic: No significant vascular findings are present. No enlarged abdominal or pelvic lymph nodes. Reproductive: Status post hysterectomy. No adnexal masses. Other: No ascites, focal  collection or pneumoperitoneum. A small stable infraumbilical midline ventral hernia within the UPPER pelvis containing only fat is again noted. Musculoskeletal: No acute or suspicious bony abnormalities. IMPRESSION: 1. No evidence of acute abnormality. No findings to suggest a cause for this patient's abdominal pain. 2. Mild hepatic steatosis. Electronically Signed   By: Margarette Canada M.D.   On: 01/14/2018 14:25   Dg Abdomen 1 View  Result Date: 01/14/2018 CLINICAL DATA:  C difficile colitis, persistent diarrhea, syncopal episode this morning, lower abdominal pain and nausea EXAM: ABDOMEN - 1 VIEW COMPARISON:  CT abdomen and pelvis 01/09/2018 FINDINGS: Food debris in stomach. Surgical clips RIGHT upper quadrant consistent with cholecystectomy. Retained contrast within a diverticulum at the ascending colon. Normal bowel gas pattern without bowel dilatation or definite bowel wall thickening. Bones demineralized. Numerous pelvic phleboliths. IMPRESSION: Unremarkable abdominal radiograph. Electronically Signed   By: Lavonia Dana M.D.   On: 01/14/2018 12:22    Scheduled Meds: . budesonide (PULMICORT) nebulizer solution  0.25 mg Nebulization BID  . enoxaparin (LOVENOX) injection  40 mg  Subcutaneous Q24H  . ferrous sulfate  325 mg Oral Q breakfast  . fidaxomicin  200 mg Oral BID  . losartan  12.5 mg Oral BID  . metoprolol succinate  100 mg Oral Daily  . multivitamin with minerals  1 tablet Oral Daily  . rOPINIRole  4 mg Oral QHS  . sertraline  50 mg Oral Daily  . spironolactone  50 mg Oral Daily  . vitamin B-12  500 mcg Oral Daily    Assessment/Plan:  1. Norovirus, leukocytosis.  Supportive care 2. Recent C. difficile colitis on Dificid 3. Essential hypertension on losartan and metoprolol 4. Chronic systolic heart failure on losartan, metoprolol and Spironolactone 5. Depression on Zoloft 6. History of paroxysmal atrial fibrillation on metoprolol for heart rate control.  Code Status:     Code Status Orders  (From admission, onward)        Start     Ordered   01/14/18 1553  Full code  Continuous     01/14/18 1553    Code Status History    Date Active Date Inactive Code Status Order ID Comments User Context   01/08/2018 2351 01/11/2018 2010 Full Code [id number]  Amelia Jo, MD ED   10/30/2017 0113 11/01/2017 1421 Full Code [id number]  Lance Coon, MD ED   09/24/2017 0848 09/27/2017 1409 Full Code [id number]  Loletha Grayer, MD ED   07/03/2017 0654 07/05/2017 1248 Full Code [id number]  Saundra Shelling, MD ED   04/25/2017 0146 04/26/2017 1516 Full Code [id number]  [place], [place], DO ED   01/28/2017 1033 01/30/2017 1741 Full Code [id number]  [name], MD Inpatient   01/22/2017 0114 01/24/2017 1759 Full Code [id number]  Lance Coon, MD Inpatient   12/09/2016 0209 12/09/2016 1507 Full Code [id number]  Lance Coon, MD Inpatient   11/21/2015 0650 11/22/2015 1550 Full Code [id number]  Saundra Shelling, MD Inpatient   09/19/2015 0511 09/21/2015 1829 Full Code [id number]  Harrie Foreman, MD Inpatient     Disposition Plan: To be determined  Antibiotics:  Dificid  Time spent: 28 minutes  [place]

## 2018-01-16 LAB — BASIC METABOLIC PANEL
Anion gap: 6 (ref 5–15)
BUN: 10 mg/dL (ref 6–20)
CALCIUM: 8.7 mg/dL — AB (ref 8.9–10.3)
CHLORIDE: 103 mmol/L (ref 101–111)
CO2: 28 mmol/L (ref 22–32)
Creatinine, Ser: 0.94 mg/dL (ref 0.44–1.00)
GFR calc Af Amer: >60 mL/min (ref >60)
Glucose, Bld: 96 mg/dL (ref 65–99)
POTASSIUM: 4 mmol/L (ref 3.5–5.1)
SODIUM: 137 mmol/L (ref 135–145)

## 2018-01-16 MED ORDER — METOPROLOL SUCCINATE ER 50 MG PO TB24: 50.0000 mg | ORAL_TABLET | Freq: Every day | ORAL | 0 refills | Status: DC

## 2018-01-16 MED ORDER — HYDROMORPHONE HCL 2 MG PO TABS: 2.0000 mg | ORAL_TABLET | Freq: Four times a day (QID) | ORAL | 0 refills | Status: DC | PRN

## 2018-01-16 MED ORDER — ROPINIROLE HCL 2 MG PO TABS: 4.0000 mg | ORAL_TABLET | Freq: Every day | ORAL | Status: DC

## 2018-01-16 MED ORDER — PROCHLORPERAZINE MALEATE 10 MG PO TABS: 10.0000 mg | ORAL_TABLET | Freq: Four times a day (QID) | ORAL | 0 refills | Status: DC | PRN

## 2018-01-16 MED ORDER — HYDROMORPHONE HCL 2 MG PO TABS: 2.0000 mg | ORAL_TABLET | Freq: Four times a day (QID) | ORAL | 0 refills | Status: AC | PRN

## 2018-01-16 MED ORDER — HYDROMORPHONE HCL 2 MG PO TABS
2.0000 mg | ORAL_TABLET | Freq: Four times a day (QID) | ORAL | Status: DC | PRN
  Administered 2018-01-16: 2 mg via ORAL
  Filled 2018-01-16: qty 1

## 2018-01-16 MED ORDER — METOPROLOL SUCCINATE ER 100 MG PO TB24: 100.0000 mg | ORAL_TABLET | Freq: Every day | ORAL | 0 refills | Status: DC

## 2018-01-16 NOTE — Progress Notes (Signed)
Brittany Nguyen to be D/C'd home per MD order.  Discussed prescriptions and follow up appointments with the patient. Prescriptions given to patient, medication list explained in detail. Pt verbalized understanding.  Allergies as of 01/16/2018      Reactions   Contrast Media [iodinated Diagnostic Agents] Shortness Of Breath   Metrizamide Shortness Of Breath   Penicillins Hives, Other (See Comments)   Other reaction(s): Other (See Comments) Has patient had a PCN reaction causing immediate rash, facial/tongue/throat swelling, SOB or lightheadedness with hypotension: Yes Has patient had a PCN reaction causing severe rash involving mucus membranes or skin necrosis: No Has patient had a PCN reaction that required hospitalization No Has patient had a PCN reaction occurring within the last 10 years: No If all of the above answers are "NO", then may proceed with Cephalosporin use. Has patient had a PCN reaction causing immediate rash, facial/tongue/throat swelling, SOB or lightheadedness with hypotension: Yes Has patient had a PCN reaction causing severe rash involving mucus membranes or skin necrosis: No Has patient had a PCN reaction that required hospitalization No Has patient had a PCN reaction occurring within the last 10 years: No If all of the above answers are "NO", then may proceed with Cephalosporin use.   Isosorbide Nitrate    Other reaction(s): Headache   Latex Hives   Ondansetron Other (See Comments)   Severe headache   Zofran [ondansetron Hcl] Other (See Comments)   Causes migraines   Povidone-iodine Rash   Blistering rash   Pulmicort [budesonide] Itching      Medication List    STOP taking these medications   chlorpheniramine-HYDROcodone 10-8 MG/5ML Suer Commonly known as:  TUSSIONEX   HYDROcodone-acetaminophen 5-325 MG tablet Commonly known as:  NORCO/VICODIN   promethazine 6.25 MG/5ML syrup Commonly known as:  PHENERGAN     TAKE these medications   acetaminophen  325 MG tablet Commonly known as:  TYLENOL Take 2 tablets (650 mg total) by mouth every 6 (six) hours as needed for mild pain (or Fever >/= 101).   albuterol 1.25 MG/3ML nebulizer solution Commonly known as:  ACCUNEB Take 3 mLs by nebulization every 6 (six) hours as needed for wheezing.   beclomethasone 80 MCG/ACT inhaler Commonly known as:  QVAR Inhale 2 puffs into the lungs 2 (two) times daily.   dicyclomine 10 MG capsule Commonly known as:  BENTYL Take 10 mg by mouth 3 (three) times daily as needed (abdominal pain).   ferrous sulfate 325 (65 FE) MG tablet Take 325 mg by mouth daily with breakfast.   fidaxomicin 200 MG Tabs tablet Commonly known as:  DIFICID Take 1 tablet (200 mg total) by mouth 2 (two) times daily for 10 days.   HYDROmorphone 2 MG tablet Commonly known as:  DILAUDID Take 1 tablet (2 mg total) by mouth every 6 (six) hours as needed for up to 3 days for severe pain.   losartan 25 MG tablet Commonly known as:  COZAAR Take 12.5 mg by mouth 2 (two) times daily.   metoprolol succinate 100 MG 24 hr tablet Commonly known as:  TOPROL-XL Take 1 tablet (100 mg total) by mouth daily. Take with or immediately following a meal. What changed:    medication strength  how much to take   multivitamin with minerals Tabs tablet Take 1 tablet by mouth daily.   nitroGLYCERIN 0.4 MG SL tablet Commonly known as:  NITROSTAT Place 1 tablet (0.4 mg total) under the tongue every 5 (five) minutes as needed for chest  pain.   pantoprazole 40 MG tablet Commonly known as:  PROTONIX Take 40 mg by mouth 2 (two) times daily.   prochlorperazine 10 MG tablet Commonly known as:  COMPAZINE Take 1 tablet (10 mg total) by mouth every 6 (six) hours as needed for nausea or vomiting.   rOPINIRole 2 MG tablet Commonly known as:  REQUIP Take 2 tablets (4 mg total) by mouth at bedtime. 2-4  Mg per night What changed:  how much to take   sertraline 50 MG tablet Commonly known as:   ZOLOFT Take 50 mg by mouth daily.   spironolactone 50 MG tablet Commonly known as:  ALDACTONE Take 50 mg by mouth daily.   traMADol 50 MG tablet Commonly known as:  ULTRAM Take 1 tablet (50 mg total) by mouth every 6 (six) hours as needed.   vitamin B-12 500 MCG tablet Commonly known as:  CYANOCOBALAMIN Take 500 mcg by mouth daily.       Vitals:   01/15/18 2038 01/16/18 0419  BP: 113/83 105/70  Pulse: 89 94  Resp: 20 16  Temp: 98 F (36.7 C) 98.8 F (37.1 C)  SpO2: 94% 96%    Skin clean, dry and intact without evidence of skin break down, no evidence of skin tears noted. IV catheter discontinued intact. Site without signs and symptoms of complications. Dressing and pressure applied. Pt denies pain at this time. No complaints noted.  An After Visit Summary was printed and given to the patient. Patient escorted via [place], and D/C home via private auto.  Misbah Hornaday A

## 2018-01-16 NOTE — Discharge Summary (Signed)
[facility] [place] NAME: Gerrie Castiglia    MR#:  [id number]  DATE OF BIRTH:  11-08-1965  DATE OF ADMISSION:  01/14/2018 ADMITTING PHYSICIAN: Bettey Costa, MD  DATE OF DISCHARGE: 01/16/2018  1:30 PM  PRIMARY CARE PHYSICIAN: [name], MD    ADMISSION DIAGNOSIS:  Dehydration [E86.0] Syncope, unspecified syncope type [R55]  DISCHARGE DIAGNOSIS:  Norovirus  SECONDARY DIAGNOSIS:   Past Medical History:  Diagnosis Date  . Asthma 2013  . CHF (congestive heart failure) ([place])   . Diverticulitis 2010  . DVT (deep venous thrombosis) ([place])   . Endometriosis 1990  . Heart disease 2013  . Hx MRSA infection   . Lump or mass in breast   . Restless leg     HOSPITAL COURSE:   1.  Norovirus and leukocytosis.  Supportive care.  Patient feeling better and stable for discharge. 2.  Recent C. difficile colitis on Dificid.  She has this medication at home and follow-up appointment with Dr. Vira Agar gastroenterology. 3.  Essential hypertension on losartan and metoprolol. 4.  Chronic systolic congestive heart failure on losartan metoprolol and Spironolactone.  No signs of congestive heart failure currently. 5.  Depression on Zoloft 6.  History of paroxysmal atrial fibrillation on metoprolol for heart rate control  DISCHARGE CONDITIONS:   Satisfactory  CONSULTS OBTAINED:  Treatment Team:  Lin Landsman, MD Leonel Ramsay, MD  DRUG ALLERGIES:   Allergies  Allergen Reactions  . Contrast Media [Iodinated Diagnostic Agents] Shortness Of Breath  . Metrizamide Shortness Of Breath  . Penicillins Hives and Other (See Comments)    Other reaction(s): Other (See Comments) Has patient had a PCN reaction causing immediate rash, facial/tongue/throat swelling, SOB or lightheadedness with hypotension: Yes Has patient had a PCN reaction causing severe rash involving mucus membranes or skin necrosis: No Has patient had a PCN reaction that  required hospitalization No Has patient had a PCN reaction occurring within the last 10 years: No If all of the above answers are "NO", then may proceed with Cephalosporin use. Has patient had a PCN reaction causing immediate rash, facial/tongue/throat swelling, SOB or lightheadedness with hypotension: Yes Has patient had a PCN reaction causing severe rash involving mucus membranes or skin necrosis: No Has patient had a PCN reaction that required hospitalization No Has patient had a PCN reaction occurring within the last 10 years: No If all of the above answers are "NO", then may proceed with Cephalosporin use.   . Isosorbide Nitrate     Other reaction(s): Headache  . Latex Hives  . Ondansetron Other (See Comments)    Severe headache  . Zofran [Ondansetron Hcl] Other (See Comments)    Causes migraines  . Povidone-Iodine Rash    Blistering rash  . Pulmicort [Budesonide] Itching    DISCHARGE MEDICATIONS:   Allergies as of 01/16/2018      Reactions   Contrast Media [iodinated Diagnostic Agents] Shortness Of Breath   Metrizamide Shortness Of Breath   Penicillins Hives, Other (See Comments)   Other reaction(s): Other (See Comments) Has patient had a PCN reaction causing immediate rash, facial/tongue/throat swelling, SOB or lightheadedness with hypotension: Yes Has patient had a PCN reaction causing severe rash involving mucus membranes or skin necrosis: No Has patient had a PCN reaction that required hospitalization No Has patient had a PCN reaction occurring within the last 10 years: No If all of the above answers are "NO", then may proceed with Cephalosporin use. Has patient  had a PCN reaction causing immediate rash, facial/tongue/throat swelling, SOB or lightheadedness with hypotension: Yes Has patient had a PCN reaction causing severe rash involving mucus membranes or skin necrosis: No Has patient had a PCN reaction that required hospitalization No Has patient had a PCN reaction  occurring within the last 10 years: No If all of the above answers are "NO", then may proceed with Cephalosporin use.   Isosorbide Nitrate    Other reaction(s): Headache   Latex Hives   Ondansetron Other (See Comments)   Severe headache   Zofran [ondansetron Hcl] Other (See Comments)   Causes migraines   Povidone-iodine Rash   Blistering rash   Pulmicort [budesonide] Itching      Medication List    STOP taking these medications   chlorpheniramine-HYDROcodone 10-8 MG/5ML Suer Commonly known as:  TUSSIONEX   HYDROcodone-acetaminophen 5-325 MG tablet Commonly known as:  NORCO/VICODIN   promethazine 6.25 MG/5ML syrup Commonly known as:  PHENERGAN     TAKE these medications   acetaminophen 325 MG tablet Commonly known as:  TYLENOL Take 2 tablets (650 mg total) by mouth every 6 (six) hours as needed for mild pain (or Fever >/= 101).   albuterol 1.25 MG/3ML nebulizer solution Commonly known as:  ACCUNEB Take 3 mLs by nebulization every 6 (six) hours as needed for wheezing.   beclomethasone 80 MCG/ACT inhaler Commonly known as:  QVAR Inhale 2 puffs into the lungs 2 (two) times daily.   dicyclomine 10 MG capsule Commonly known as:  BENTYL Take 10 mg by mouth 3 (three) times daily as needed (abdominal pain).   ferrous sulfate 325 (65 FE) MG tablet Take 325 mg by mouth daily with breakfast.   fidaxomicin 200 MG Tabs tablet Commonly known as:  DIFICID Take 1 tablet (200 mg total) by mouth 2 (two) times daily for 10 days.   HYDROmorphone 2 MG tablet Commonly known as:  DILAUDID Take 1 tablet (2 mg total) by mouth every 6 (six) hours as needed for up to 3 days for severe pain.   losartan 25 MG tablet Commonly known as:  COZAAR Take 12.5 mg by mouth 2 (two) times daily.   metoprolol succinate 100 MG 24 hr tablet Commonly known as:  TOPROL-XL Take 1 tablet (100 mg total) by mouth daily. Take with or immediately following a meal. What changed:    medication  strength  how much to take   multivitamin with minerals Tabs tablet Take 1 tablet by mouth daily.   nitroGLYCERIN 0.4 MG SL tablet Commonly known as:  NITROSTAT Place 1 tablet (0.4 mg total) under the tongue every 5 (five) minutes as needed for chest pain.   pantoprazole 40 MG tablet Commonly known as:  PROTONIX Take 40 mg by mouth 2 (two) times daily.   prochlorperazine 10 MG tablet Commonly known as:  COMPAZINE Take 1 tablet (10 mg total) by mouth every 6 (six) hours as needed for nausea or vomiting.   rOPINIRole 2 MG tablet Commonly known as:  REQUIP Take 2 tablets (4 mg total) by mouth at bedtime. 2-4  Mg per night What changed:  how much to take   sertraline 50 MG tablet Commonly known as:  ZOLOFT Take 50 mg by mouth daily.   spironolactone 50 MG tablet Commonly known as:  ALDACTONE Take 50 mg by mouth daily.   traMADol 50 MG tablet Commonly known as:  ULTRAM Take 1 tablet (50 mg total) by mouth every 6 (six) hours as needed.  vitamin B-12 500 MCG tablet Commonly known as:  CYANOCOBALAMIN Take 500 mcg by mouth daily.        DISCHARGE INSTRUCTIONS:    Follow-up PMD 6 days Follow-up Dr. Vira Agar as scheduled  If you experience worsening of your admission symptoms, develop shortness of breath, life threatening emergency, suicidal or homicidal thoughts you must seek medical attention immediately by calling 911 or calling your MD immediately  if symptoms less severe.  You Must read complete instructions/literature along with all the possible adverse reactions/side effects for all the Medicines you take and that have been prescribed to you. Take any new Medicines after you have completely understood and accept all the possible adverse reactions/side effects.   Please note  You were cared for by a hospitalist during your hospital stay. If you have any questions about your discharge medications or the care you received while you were in the hospital after you are  discharged, you can call the unit and asked to speak with the hospitalist on call if the hospitalist that took care of you is not available. Once you are discharged, your primary care physician will handle any further medical issues. Please note that NO REFILLS for any discharge medications will be authorized once you are discharged, as it is imperative that you return to your primary care physician (or establish a relationship with a primary care physician if you do not have one) for your aftercare needs so that they can reassess your need for medications and monitor your lab values.    Today   CHIEF COMPLAINT:   Chief Complaint  Patient presents with  . Abdominal Pain  . Diarrhea    HISTORY OF PRESENT ILLNESS:  Brittany Nguyen  is a 52 y.o. female  came in with abdominal pain and diarrhea  VITAL SIGNS:  Blood pressure 105/70, pulse 94, temperature 98.8 F (37.1 C), temperature source Oral, resp. rate 16, height 5\' 5"  (1.651 m), weight 99.8 kg (220 lb), last menstrual period 01/22/2012, SpO2 96 %.   PHYSICAL EXAMINATION:  GENERAL:  52 y.o.-year-old patient lying in the bed with no acute distress.  EYES: Pupils equal, round, reactive to light and accommodation. No scleral icterus. Extraocular muscles intact.  HEENT: Head atraumatic, normocephalic. Oropharynx and nasopharynx clear.  NECK:  Supple, no jugular venous distention. No thyroid enlargement, no tenderness.  LUNGS: Normal breath sounds bilaterally, no wheezing, rales,rhonchi or crepitation. No use of accessory muscles of respiration.  CARDIOVASCULAR: S1, S2 normal. No murmurs, rubs, or gallops.  ABDOMEN: Soft, non-tender, non-distended. Bowel sounds present. No organomegaly or mass.  EXTREMITIES: No pedal edema, cyanosis, or clubbing.  NEUROLOGIC: Cranial nerves II through XII are intact. Muscle strength 5/5 in all extremities. Sensation intact. Gait not checked.  PSYCHIATRIC: The patient is alert and oriented x 3.  SKIN: No  obvious rash, lesion, or ulcer.   DATA REVIEW:   CBC Recent Labs  Lab 01/15/18 0513  WBC 5.1  HGB 10.8*  HCT 31.6*  PLT 244    Chemistries  Recent Labs  Lab 01/11/18 0426 01/14/18 1116  01/16/18 0413  NA 141 135   < > 137  K 3.3* 3.9   < > 4.0  CL 106 99*   < > 103  CO2 27 25   < > 28  GLUCOSE 112* 105*   < > 96  BUN 8 12   < > 10  CREATININE 0.81 0.86   < > 0.94  CALCIUM 8.4* 9.4   < >  8.7*  MG 2.0  --   --   --   AST  --  34  --   --   ALT  --  26  --   --   ALKPHOS  --  102  --   --   BILITOT  --  0.7  --   --    < > = values in this interval not displayed.    Cardiac Enzymes Recent Labs  Lab 01/14/18 1116  TROPONINI <0.03    Microbiology Results  Results for orders placed or performed during the hospital encounter of 01/14/18  C difficile quick scan w PCR reflex     Status: None   Collection Time: 01/14/18 11:32 AM  Result Value Ref Range Status   C Diff antigen NEGATIVE NEGATIVE Final   C Diff toxin NEGATIVE NEGATIVE Final   C Diff interpretation No C. difficile detected.  Final    Comment: Performed at [facility], [address] [place], [place] [address]  Gastrointestinal Panel by PCR , Stool     Status: Abnormal   Collection Time: 01/14/18 11:32 AM  Result Value Ref Range Status   Campylobacter species NOT DETECTED NOT DETECTED Final   Plesimonas shigelloides NOT DETECTED NOT DETECTED Final   Salmonella species NOT DETECTED NOT DETECTED Final   Yersinia enterocolitica NOT DETECTED NOT DETECTED Final   Vibrio species NOT DETECTED NOT DETECTED Final   Vibrio cholerae NOT DETECTED NOT DETECTED Final   Enteroaggregative E coli (EAEC) NOT DETECTED NOT DETECTED Final   Enteropathogenic E coli (EPEC) NOT DETECTED NOT DETECTED Final   Enterotoxigenic E coli (ETEC) NOT DETECTED NOT DETECTED Final   Shiga like toxin producing E coli (STEC) NOT DETECTED NOT DETECTED Final   Shigella/Enteroinvasive E coli (EIEC) NOT DETECTED NOT DETECTED Final    Cryptosporidium NOT DETECTED NOT DETECTED Final   Cyclospora cayetanensis NOT DETECTED NOT DETECTED Final   Entamoeba histolytica NOT DETECTED NOT DETECTED Final   Giardia lamblia NOT DETECTED NOT DETECTED Final   Adenovirus F40/41 NOT DETECTED NOT DETECTED Final   Astrovirus NOT DETECTED NOT DETECTED Final   Norovirus GI/GII DETECTED (A) NOT DETECTED Final    Comment: RESULT CALLED TO, READ BACK BY AND VERIFIED WITH: BRANDY DAVIS @1647  01/14/18 AKT    Rotavirus A NOT DETECTED NOT DETECTED Final   Sapovirus (I, II, IV, and V) NOT DETECTED NOT DETECTED Final    Comment: Performed at [facility], [address] [place],  [address]     Management plans discussed with the patient,  and she is in agreement.  CODE STATUS:     Code Status Orders  (From admission, onward)        Start     Ordered   01/14/18 1553  Full code  Continuous     01/14/18 1553    Code Status History    Date Active Date Inactive Code Status Order ID Comments User Context   01/08/2018 2351 01/11/2018 2010 Full Code [id number]  Amelia Jo, MD ED   10/30/2017 0113 11/01/2017 1421 Full Code [id number]  Lance Coon, MD ED   09/24/2017 0848 09/27/2017 1409 Full Code [id number]  Loletha Grayer, MD ED   07/03/2017 0654 07/05/2017 1248 Full Code [id number]  Saundra Shelling, MD ED   04/25/2017 0146 04/26/2017 1516 Full Code [id number]  Marathon, [place], DO ED   01/28/2017 1033 01/30/2017 1741 Full Code [id number]  [name], MD Inpatient   01/22/2017 0114 01/24/2017 1759 Full  Code [id number]  Lance Coon, MD Inpatient   12/09/2016 0209 12/09/2016 1507 Full Code [id number]  Lance Coon, MD Inpatient   11/21/2015 0650 11/22/2015 1550 Full Code [id number]  Saundra Shelling, MD Inpatient   09/19/2015 0511 09/21/2015 1829 Full Code [id number]  Harrie Foreman, MD Inpatient      TOTAL TIME TAKING CARE OF THIS PATIENT: 35 minutes.    Loletha Grayer M.D on 01/16/2018 at 3:47 PM  Between 7am to 6pm - Pager -  [phone]  After 6pm go to [email or web address] - password [email or web address] [facility]  [phone]  CC: Primary care physician; [name], MD

## 2018-01-27 ENCOUNTER — Emergency Department
Admission: EM | Admit: 2018-01-27 | Discharge: 2018-01-27 | Disposition: A | Discharge status: Home / Self Care | Payer: Medicaid Other | Attending: Emergency Medicine | Admitting: Emergency Medicine

## 2018-01-27 ENCOUNTER — Emergency Department: Payer: Medicaid Other

## 2018-01-27 ENCOUNTER — Other Ambulatory Visit: Payer: Self-pay

## 2018-01-27 DIAGNOSIS — Z5321 Procedure and treatment not carried out due to patient leaving prior to being seen by health care provider: Secondary | ICD-10-CM | POA: Insufficient documentation

## 2018-01-27 DIAGNOSIS — R079 Chest pain, unspecified: Secondary | ICD-10-CM | POA: Diagnosis present

## 2018-01-27 LAB — CBC WITH DIFFERENTIAL/PLATELET
Basophils Absolute: 0.1 10*3/uL (ref 0–0.1)
Basophils Relative: 1 %
EOS ABS: 0.2 10*3/uL (ref 0–0.7)
Eosinophils Relative: 1 %
HEMATOCRIT: 35.2 % (ref 35.0–47.0)
HEMOGLOBIN: 11.9 g/dL — AB (ref 12.0–16.0)
LYMPHS ABS: 3.3 10*3/uL (ref 1.0–3.6)
Lymphocytes Relative: 24 %
MCH: 26.9 pg (ref 26.0–34.0)
MCHC: 33.8 g/dL (ref 32.0–36.0)
MCV: 79.6 fL — ABNORMAL LOW (ref 80.0–100.0)
MONO ABS: 1.1 10*3/uL — AB (ref 0.2–0.9)
Monocytes Relative: 8 %
NEUTROS PCT: 66 %
Neutro Abs: 9.2 10*3/uL — ABNORMAL HIGH (ref 1.4–6.5)
Platelets: 315 10*3/uL (ref 150–440)
RBC: 4.41 MIL/uL (ref 3.80–5.20)
RDW: 18.3 % — AB (ref 11.5–14.5)
WBC: 13.9 10*3/uL — ABNORMAL HIGH (ref 3.6–11.0)

## 2018-01-27 LAB — COMPREHENSIVE METABOLIC PANEL
ALK PHOS: 112 U/L (ref 38–126)
ALT: 23 U/L (ref 14–54)
ANION GAP: 10 (ref 5–15)
AST: 28 U/L (ref 15–41)
Albumin: 4.2 g/dL (ref 3.5–5.0)
BILIRUBIN TOTAL: 0.3 mg/dL (ref 0.3–1.2)
BUN: 17 mg/dL (ref 6–20)
CALCIUM: 9.6 mg/dL (ref 8.9–10.3)
CO2: 30 mmol/L (ref 22–32)
Chloride: 98 mmol/L — ABNORMAL LOW (ref 101–111)
Creatinine, Ser: 1.03 mg/dL — ABNORMAL HIGH (ref 0.44–1.00)
GFR calc non Af Amer: >60 mL/min (ref >60)
Glucose, Bld: 128 mg/dL — ABNORMAL HIGH (ref 65–99)
POTASSIUM: 3 mmol/L — AB (ref 3.5–5.1)
SODIUM: 138 mmol/L (ref 135–145)
TOTAL PROTEIN: 8.3 g/dL — AB (ref 6.5–8.1)

## 2018-01-27 LAB — TROPONIN I: Troponin I: <0.03 ng/mL (ref <0.03)

## 2018-01-27 NOTE — ED Triage Notes (Signed)
Patient returns to ED with complaint of chest pain, left arm pain and N/V.

## 2018-01-27 NOTE — ED Notes (Signed)
Patient reports having chest pain all day on Friday.

## 2018-01-29 ENCOUNTER — Other Ambulatory Visit: Payer: Self-pay

## 2018-01-29 ENCOUNTER — Emergency Department: Payer: Medicaid Other

## 2018-01-29 ENCOUNTER — Observation Stay
Admission: EM | Admit: 2018-01-29 | Discharge: 2018-02-01 | Disposition: A | Discharge status: Home / Self Care | Payer: Medicaid Other | Attending: Internal Medicine | Admitting: Internal Medicine

## 2018-01-29 ENCOUNTER — Telehealth: Payer: Self-pay | Admitting: Emergency Medicine

## 2018-01-29 ENCOUNTER — Encounter: Payer: Self-pay | Admitting: Emergency Medicine

## 2018-01-29 DIAGNOSIS — G8929 Other chronic pain: Secondary | ICD-10-CM | POA: Insufficient documentation

## 2018-01-29 DIAGNOSIS — Z90710 Acquired absence of both cervix and uterus: Secondary | ICD-10-CM | POA: Insufficient documentation

## 2018-01-29 DIAGNOSIS — Z7951 Long term (current) use of inhaled steroids: Secondary | ICD-10-CM | POA: Diagnosis not present

## 2018-01-29 DIAGNOSIS — I428 Other cardiomyopathies: Secondary | ICD-10-CM | POA: Diagnosis not present

## 2018-01-29 DIAGNOSIS — Z880 Allergy status to penicillin: Secondary | ICD-10-CM | POA: Insufficient documentation

## 2018-01-29 DIAGNOSIS — I252 Old myocardial infarction: Secondary | ICD-10-CM | POA: Diagnosis not present

## 2018-01-29 DIAGNOSIS — Z9889 Other specified postprocedural states: Secondary | ICD-10-CM | POA: Diagnosis not present

## 2018-01-29 DIAGNOSIS — I951 Orthostatic hypotension: Secondary | ICD-10-CM | POA: Insufficient documentation

## 2018-01-29 DIAGNOSIS — Z91041 Radiographic dye allergy status: Secondary | ICD-10-CM | POA: Diagnosis not present

## 2018-01-29 DIAGNOSIS — Z95810 Presence of automatic (implantable) cardiac defibrillator: Secondary | ICD-10-CM | POA: Diagnosis not present

## 2018-01-29 DIAGNOSIS — R079 Chest pain, unspecified: Secondary | ICD-10-CM | POA: Diagnosis present

## 2018-01-29 DIAGNOSIS — Z8614 Personal history of Methicillin resistant Staphylococcus aureus infection: Secondary | ICD-10-CM | POA: Insufficient documentation

## 2018-01-29 DIAGNOSIS — Z79899 Other long term (current) drug therapy: Secondary | ICD-10-CM | POA: Diagnosis not present

## 2018-01-29 DIAGNOSIS — K219 Gastro-esophageal reflux disease without esophagitis: Secondary | ICD-10-CM | POA: Diagnosis not present

## 2018-01-29 DIAGNOSIS — I5042 Chronic combined systolic (congestive) and diastolic (congestive) heart failure: Secondary | ICD-10-CM | POA: Diagnosis not present

## 2018-01-29 DIAGNOSIS — R7989 Other specified abnormal findings of blood chemistry: Secondary | ICD-10-CM | POA: Diagnosis not present

## 2018-01-29 DIAGNOSIS — I200 Unstable angina: Secondary | ICD-10-CM | POA: Diagnosis present

## 2018-01-29 DIAGNOSIS — Z91040 Latex allergy status: Secondary | ICD-10-CM | POA: Diagnosis not present

## 2018-01-29 DIAGNOSIS — E860 Dehydration: Secondary | ICD-10-CM | POA: Insufficient documentation

## 2018-01-29 DIAGNOSIS — Z9049 Acquired absence of other specified parts of digestive tract: Secondary | ICD-10-CM | POA: Diagnosis not present

## 2018-01-29 DIAGNOSIS — R059 Cough, unspecified: Secondary | ICD-10-CM

## 2018-01-29 DIAGNOSIS — R05 Cough: Secondary | ICD-10-CM

## 2018-01-29 DIAGNOSIS — D649 Anemia, unspecified: Secondary | ICD-10-CM | POA: Insufficient documentation

## 2018-01-29 DIAGNOSIS — Z6836 Body mass index (BMI) 36.0-36.9, adult: Secondary | ICD-10-CM | POA: Insufficient documentation

## 2018-01-29 DIAGNOSIS — G2581 Restless legs syndrome: Secondary | ICD-10-CM | POA: Insufficient documentation

## 2018-01-29 DIAGNOSIS — Z8249 Family history of ischemic heart disease and other diseases of the circulatory system: Secondary | ICD-10-CM | POA: Insufficient documentation

## 2018-01-29 DIAGNOSIS — I272 Pulmonary hypertension, unspecified: Secondary | ICD-10-CM | POA: Insufficient documentation

## 2018-01-29 DIAGNOSIS — Z888 Allergy status to other drugs, medicaments and biological substances status: Secondary | ICD-10-CM | POA: Diagnosis not present

## 2018-01-29 DIAGNOSIS — I25110 Atherosclerotic heart disease of native coronary artery with unstable angina pectoris: Principal | ICD-10-CM | POA: Insufficient documentation

## 2018-01-29 DIAGNOSIS — G4733 Obstructive sleep apnea (adult) (pediatric): Secondary | ICD-10-CM | POA: Insufficient documentation

## 2018-01-29 DIAGNOSIS — J441 Chronic obstructive pulmonary disease with (acute) exacerbation: Secondary | ICD-10-CM | POA: Insufficient documentation

## 2018-01-29 DIAGNOSIS — E876 Hypokalemia: Secondary | ICD-10-CM | POA: Insufficient documentation

## 2018-01-29 DIAGNOSIS — E049 Nontoxic goiter, unspecified: Secondary | ICD-10-CM | POA: Insufficient documentation

## 2018-01-29 DIAGNOSIS — Z7189 Other specified counseling: Secondary | ICD-10-CM

## 2018-01-29 DIAGNOSIS — F329 Major depressive disorder, single episode, unspecified: Secondary | ICD-10-CM | POA: Insufficient documentation

## 2018-01-29 LAB — BASIC METABOLIC PANEL
ANION GAP: 8 (ref 5–15)
BUN: 12 mg/dL (ref 6–20)
CHLORIDE: 101 mmol/L (ref 101–111)
CO2: 29 mmol/L (ref 22–32)
Calcium: 9.2 mg/dL (ref 8.9–10.3)
Creatinine, Ser: 0.94 mg/dL (ref 0.44–1.00)
GFR calc non Af Amer: >60 mL/min (ref >60)
GLUCOSE: 116 mg/dL — AB (ref 65–99)
Potassium: 3.2 mmol/L — ABNORMAL LOW (ref 3.5–5.1)
Sodium: 138 mmol/L (ref 135–145)

## 2018-01-29 LAB — CBC
HCT: 34.4 % — ABNORMAL LOW (ref 35.0–47.0)
HEMOGLOBIN: 11.6 g/dL — AB (ref 12.0–16.0)
MCH: 27.1 pg (ref 26.0–34.0)
MCHC: 33.9 g/dL (ref 32.0–36.0)
MCV: 80.1 fL (ref 80.0–100.0)
Platelets: 273 10*3/uL (ref 150–440)
RBC: 4.29 MIL/uL (ref 3.80–5.20)
RDW: 17.6 % — ABNORMAL HIGH (ref 11.5–14.5)
WBC: 10.3 10*3/uL (ref 3.6–11.0)

## 2018-01-29 LAB — TROPONIN I: Troponin I: <0.03 ng/mL (ref <0.03)

## 2018-01-29 MED ORDER — ASPIRIN 81 MG PO CHEW
324.0000 mg | CHEWABLE_TABLET | Freq: Once | ORAL | Status: AC
  Administered 2018-01-30: 324 mg via ORAL
  Filled 2018-01-29: qty 4

## 2018-01-29 MED ORDER — MORPHINE SULFATE (PF) 4 MG/ML IV SOLN
4.0000 mg | Freq: Once | INTRAVENOUS | Status: AC
  Administered 2018-01-30: 4 mg via INTRAVENOUS
  Filled 2018-01-29: qty 1

## 2018-01-29 NOTE — ED Triage Notes (Signed)
Pt presents to ED with left sided chest pain that radiates into the left side of her neck for the past 2 days with headache and left arm numbness. Cardiac hx. Pt states she has been feeling very dizzy and occasionally feels like she is going to "pass out".

## 2018-01-29 NOTE — Telephone Encounter (Signed)
Called patient due to lwot to inquire about condition and follow up plans. She says she continues to have pain in her arm.  I told her she can return here.  Says she has a call in to her cardiologist, but he is on vacation.  She says she will return if she gets worse, but otherwise will follow up tomorrow when offices open.

## 2018-01-29 NOTE — ED Provider Notes (Signed)
[facility] Emergency Department Provider Note   ____________________________________________   First MD Initiated Contact with Patient 01/29/18 2312     (approximate)  I have reviewed the triage vital signs and the nursing notes.   HISTORY  Chief Complaint Numbness; Chest Pain; and Headache    HPI Brittany Nguyen is a 52 y.o. female who presents to the ED from home with a chief complaint of chest pain.  Patient has a history of CAD, CHF with pacemaker who has felt fatigued for the past 2 days.  Yesterday she began having intermittent central chest pressure radiating to her shoulder blades.  Symptoms associated with diaphoresis and nausea.  Pain partially relieved by nitroglycerin.  Today she presents to the ED for more frequent episodes of chest pain.  Additionally she is having periods of lightheadedness and feeling like she is going to pass out.  Denies recent fever, chills, cough, shortness of breath, abdominal pain, nausea, vomiting.  Hospitalized approximately 2 weeks ago for norovirus.  Prior to that patient had C. difficile colitis.  Denies recent travel or trauma.   Past Medical History:  Diagnosis Date  . Asthma 2013  . CHF (congestive heart failure) ([place])   . Diverticulitis 2010  . DVT (deep venous thrombosis) ([place])   . Endometriosis 1990  . Heart disease 2013  . Hx MRSA infection   . Lump or mass in breast   . Restless leg     Patient Active Problem List   Diagnosis Date Noted  . C. difficile colitis 01/14/2018  . GIB (gastrointestinal bleeding) 01/08/2018  . SOB (shortness of breath) 11/01/2017  . Influenza A 10/29/2017  . Acute respiratory failure ([place]) 09/24/2017  . Sepsis ([place]) 07/03/2017  . COPD with acute exacerbation ([place]) 04/26/2017  . Community acquired pneumonia 04/25/2017  . Chronic systolic heart failure ([place]) 02/03/2017  . Hypotension 02/03/2017  . Chest pain 12/09/2016  . RLS (restless legs syndrome) 12/09/2016    . Cough, persistent 11/22/2015  . Hypokalemia 11/22/2015  . Leukocytosis 11/22/2015  . Dyspnea 11/21/2015  . Respiratory distress 09/19/2015  . Breast pain 01/11/2013  . Hx MRSA infection   . Lump or mass in breast   . GOITER, MULTINODULAR 01/20/2009  . HYPOGLYCEMIA, UNSPECIFIED 01/20/2009  . GERD 01/20/2009  . DIVERTICULITIS OF COLON 01/20/2009    Past Surgical History:  Procedure Laterality Date  . ABDOMINAL HYSTERECTOMY     age 70  . CARDIAC DEFIBRILLATOR PLACEMENT    . CESAREAN SECTION    . CHOLECYSTECTOMY    . COLECTOMY    . NASAL SINUS SURGERY  2012    Prior to Admission medications   Medication Sig Start Date End Date Taking? Authorizing Provider  acetaminophen (TYLENOL) 325 MG tablet Take 2 tablets (650 mg total) by mouth every 6 (six) hours as needed for mild pain (or Fever >/= 101). 11/01/17   Gouru, Illene Silver, MD  albuterol (ACCUNEB) 1.25 MG/3ML nebulizer solution Take 3 mLs by nebulization every 6 (six) hours as needed for wheezing. 11/09/17   [provider]  beclomethasone (QVAR) 80 MCG/ACT inhaler Inhale 2 puffs into the lungs 2 (two) times daily. 06/05/17 06/05/18  Laverle Hobby, MD  cyanocobalamin 500 MCG tablet Take 500 mcg by mouth daily.    [provider]  dicyclomine (BENTYL) 10 MG capsule Take 10 mg by mouth 3 (three) times daily as needed (abdominal pain).  12/29/17   [provider]  ferrous sulfate 325 (65 FE) MG tablet Take 325  mg by mouth daily with breakfast.    [provider]  losartan (COZAAR) 25 MG tablet Take 12.5 mg by mouth 2 (two) times daily.    [provider]  metoprolol succinate (TOPROL-XL) 100 MG 24 hr tablet Take 1 tablet (100 mg total) by mouth daily. Take with or immediately following a meal. 01/16/18   Loletha Grayer, MD  Multiple Vitamin (MULTIVITAMIN WITH MINERALS) TABS tablet Take 1 tablet by mouth daily.    [provider]  nitroGLYCERIN (NITROSTAT) 0.4 MG SL tablet Place 1  tablet (0.4 mg total) under the tongue every 5 (five) minutes as needed for chest pain. 01/24/17   Henreitta Leber, MD  pantoprazole (PROTONIX) 40 MG tablet Take 40 mg by mouth 2 (two) times daily. 01/02/18   [provider]  prochlorperazine (COMPAZINE) 10 MG tablet Take 1 tablet (10 mg total) by mouth every 6 (six) hours as needed for nausea or vomiting. 01/16/18   Loletha Grayer, MD  rOPINIRole (REQUIP) 2 MG tablet Take 2 tablets (4 mg total) by mouth at bedtime. 2-4  Mg per night 01/16/18   Loletha Grayer, MD  sertraline (ZOLOFT) 50 MG tablet Take 50 mg by mouth daily.    [provider]  spironolactone (ALDACTONE) 50 MG tablet Take 50 mg by mouth daily.    [provider]  traMADol (ULTRAM) 50 MG tablet Take 1 tablet (50 mg total) by mouth every 6 (six) hours as needed. 11/01/17   Nicholes Mango, MD    Allergies Contrast media [iodinated diagnostic agents]; Metrizamide; Penicillins; Isosorbide nitrate; Latex; Ondansetron; Zofran [ondansetron hcl]; Povidone-iodine; and Pulmicort [budesonide]  Family History  Problem Relation Age of Onset  . Cancer Mother 30       ovarian  . CAD Mother   . Cancer Father 38       brain  . CAD Father   . Cancer Daughter 18       skin  . Cancer Maternal Aunt 79       breast    Social History Social History   Tobacco Use  . Smoking status: Never Smoker  . Smokeless tobacco: Never Used  Substance Use Topics  . Alcohol use: No  . Drug use: No    Review of Systems  Constitutional: Positive for fatigue and generalized weakness.  No fever/chills Eyes: No visual changes. ENT: No sore throat. Cardiovascular: Positive for chest pain. Respiratory: Denies shortness of breath. Gastrointestinal: No abdominal pain.  No nausea, no vomiting.  No diarrhea.  No constipation. Genitourinary: Negative for dysuria. Musculoskeletal: Negative for back pain. Skin: Negative for rash. Neurological: Negative for headaches, focal weakness  or numbness.   ____________________________________________   PHYSICAL EXAM:  VITAL SIGNS: ED Triage Vitals  Enc Vitals Group     BP 01/29/18 2107 116/64     Pulse Rate 01/29/18 2107 (!) 113     Resp 01/29/18 2107 18     Temp 01/29/18 2107 98.4 F (36.9 C)     Temp Source 01/29/18 2107 Oral     SpO2 01/29/18 2107 98 %     Weight 01/29/18 2108 220 lb (99.8 kg)     Height 01/29/18 2108 5\' 5"  (1.651 m)     Head Circumference --      Peak Flow --      Pain Score 01/29/18 2107 8     Pain Loc --      Pain Edu? --      Excl. in [place]? --  Constitutional: Alert and oriented.  Fatigued appearing and in no acute distress. Eyes: Conjunctivae are normal. PERRL. EOMI. Head: Atraumatic. Nose: No congestion/rhinnorhea. Mouth/Throat: Mucous membranes are moist.  Oropharynx non-erythematous. Neck: No stridor.   Cardiovascular: Normal rate, regular rhythm. Grossly normal heart sounds.  Good peripheral circulation. Respiratory: Normal respiratory effort.  No retractions. Lungs CTAB. Gastrointestinal: Soft and nontender. No distention. No abdominal bruits. No CVA tenderness. Musculoskeletal: No lower extremity tenderness nor edema.  No joint effusions. Neurologic:  Normal speech and language. No gross focal neurologic deficits are appreciated. No gait instability. Skin:  Skin is warm, dry and intact. No rash noted. Psychiatric: Mood and affect are normal. Speech and behavior are normal.  ____________________________________________   LABS (all labs ordered are listed, but only abnormal results are displayed)  Labs Reviewed  BASIC METABOLIC PANEL - Abnormal; Notable for the following components:      Result Value   Potassium 3.2 (*)    Glucose, Bld 116 (*)    All other components within normal limits  CBC - Abnormal; Notable for the following components:   Hemoglobin 11.6 (*)    HCT 34.4 (*)    RDW 17.6 (*)    All other components within normal limits  TROPONIN I    ____________________________________________  EKG  ED ECG REPORT I, SUNG,JADE J, the attending physician, personally viewed and interpreted this ECG.   Date: 01/29/2018  EKG Time: 2109  Rate: 114  Rhythm: sinus tachycardia  Axis: LAD  Intervals:left anterior fascicular block  ST&T Change: Nonspecific  ____________________________________________  RADIOLOGY  ED MD interpretation: No acute cardiopulmonary process  Official radiology report(s): Dg Chest 2 View  Result Date: 01/29/2018 CLINICAL DATA:  Acute onset of left-sided chest pain, radiating to the left side of the neck. Left arm numbness. Dizziness. EXAM: CHEST - 2 VIEW COMPARISON:  Chest radiograph performed 01/27/2018 FINDINGS: The lungs are well-aerated and clear. There is no evidence of focal opacification, pleural effusion or pneumothorax. The heart is borderline normal in size. A pacemaker/AICD is noted at the left chest wall, with leads ending at the right atrium and right ventricle. No acute osseous abnormalities are seen. Clips are noted within the right upper quadrant, reflecting prior cholecystectomy. IMPRESSION: No acute cardiopulmonary process seen. Electronically Signed   By: Garald Balding M.D.   On: 01/29/2018 21:43    ____________________________________________   PROCEDURES  Procedure(s) performed: None  Procedures  Critical Care performed: No  ____________________________________________   INITIAL IMPRESSION / ASSESSMENT AND PLAN / ED COURSE  As part of my medical decision making, I reviewed the following data within the [place] History obtained from family, Nursing notes reviewed and incorporated, Labs reviewed, EKG interpreted, Old chart reviewed, Radiograph reviewed, Discussed with admitting physician and Notes from prior ED visits   52 year old female with CAD, CHF with AICD who presents with chest pain concerning for unstable angina. Differential diagnosis includes,  but is not limited to, ACS, aortic dissection, pulmonary embolism, cardiac tamponade, pneumothorax, pneumonia, pericarditis, myocarditis, GI-related causes including esophagitis/gastritis, and musculoskeletal chest wall pain.     Laboratory and imaging results remarkable for mild hypokalemia.  Initial troponin negative.  Patient was initially tachycardic; now heart rate is 102.  Will administer aspirin, morphine for chest pain.  Hold nitroglycerin as patient's blood pressure is marginal.  Will discuss with hospitalist to evaluate patient in the emergency department for admission.      ____________________________________________   FINAL CLINICAL IMPRESSION(S) / ED DIAGNOSES  Final diagnoses:  Chest pain, unspecified type  Unstable angina [facility])     ED Discharge Orders    None       Note:  This document was prepared using Dragon voice recognition software and may include unintentional dictation errors.    Paulette Blanch, MD 01/30/18 [phone]

## 2018-01-30 ENCOUNTER — Observation Stay
Admit: 2018-01-30 | Discharge: 2018-01-30 | Disposition: A | Discharge status: Home / Self Care | Payer: Medicaid Other | Attending: Internal Medicine | Admitting: Internal Medicine

## 2018-01-30 ENCOUNTER — Other Ambulatory Visit: Payer: Self-pay

## 2018-01-30 ENCOUNTER — Encounter: Payer: Self-pay | Admitting: Internal Medicine

## 2018-01-30 DIAGNOSIS — R079 Chest pain, unspecified: Secondary | ICD-10-CM

## 2018-01-30 LAB — TROPONIN I: Troponin I: <0.03 ng/mL (ref <0.03)

## 2018-01-30 LAB — ECHOCARDIOGRAM COMPLETE
HEIGHTINCHES: 65 in
WEIGHTICAEL: 3536 [oz_av]

## 2018-01-30 LAB — MAGNESIUM: MAGNESIUM: 2 mg/dL (ref 1.7–2.4)

## 2018-01-30 LAB — GLUCOSE, CAPILLARY
Glucose-Capillary: 100 mg/dL — ABNORMAL HIGH (ref 65–99)
Glucose-Capillary: 92 mg/dL (ref 65–99)
Glucose-Capillary: 100 mg/dL — ABNORMAL HIGH (ref 65–99)

## 2018-01-30 MED ORDER — ASPIRIN EC 81 MG PO TBEC
81.0000 mg | DELAYED_RELEASE_TABLET | Freq: Every day | ORAL | Status: DC
  Administered 2018-01-30 – 2018-02-01 (×3): 81 mg via ORAL
  Filled 2018-01-30 (×3): qty 1

## 2018-01-30 MED ORDER — PERFLUTREN LIPID MICROSPHERE
1.0000 mL | INTRAVENOUS | Status: AC | PRN
  Administered 2018-01-30: 3 mL via INTRAVENOUS
  Filled 2018-01-30: qty 10

## 2018-01-30 MED ORDER — TRAMADOL HCL 50 MG PO TABS
50.0000 mg | ORAL_TABLET | Freq: Four times a day (QID) | ORAL | Status: DC | PRN
  Administered 2018-02-01: 50 mg via ORAL
  Filled 2018-01-30: qty 1

## 2018-01-30 MED ORDER — BUDESONIDE 0.25 MG/2ML IN SUSP
0.2500 mg | Freq: Two times a day (BID) | RESPIRATORY_TRACT | Status: DC
  Administered 2018-01-30 – 2018-02-01 (×5): 0.25 mg via RESPIRATORY_TRACT
  Filled 2018-01-30 (×5): qty 2

## 2018-01-30 MED ORDER — SERTRALINE HCL 50 MG PO TABS
50.0000 mg | ORAL_TABLET | Freq: Every day | ORAL | Status: DC
  Administered 2018-01-31 – 2018-02-01 (×3): 50 mg via ORAL
  Filled 2018-01-30 (×5): qty 1

## 2018-01-30 MED ORDER — SODIUM CHLORIDE 0.9 % IV SOLN
25.0000 mg | INTRAVENOUS | Status: DC | PRN
  Administered 2018-01-30 – 2018-02-01 (×4): 25 mg via INTRAVENOUS
  Filled 2018-01-30 (×6): qty 0.5

## 2018-01-30 MED ORDER — METOPROLOL SUCCINATE ER 100 MG PO TB24
100.0000 mg | ORAL_TABLET | Freq: Every day | ORAL | Status: DC
  Filled 2018-01-30: qty 1

## 2018-01-30 MED ORDER — PANTOPRAZOLE SODIUM 40 MG PO TBEC
40.0000 mg | DELAYED_RELEASE_TABLET | Freq: Two times a day (BID) | ORAL | Status: DC
  Administered 2018-01-30 – 2018-02-01 (×5): 40 mg via ORAL
  Filled 2018-01-30 (×5): qty 1

## 2018-01-30 MED ORDER — ENOXAPARIN SODIUM 40 MG/0.4ML ~~LOC~~ SOLN
40.0000 mg | SUBCUTANEOUS | Status: DC
  Administered 2018-01-30 – 2018-01-31 (×2): 40 mg via SUBCUTANEOUS
  Filled 2018-01-30 (×2): qty 0.4

## 2018-01-30 MED ORDER — NITROGLYCERIN 0.4 MG SL SUBL: 0.4000 mg | SUBLINGUAL_TABLET | SUBLINGUAL | Status: DC | PRN

## 2018-01-30 MED ORDER — LOSARTAN POTASSIUM 25 MG PO TABS
12.5000 mg | ORAL_TABLET | Freq: Two times a day (BID) | ORAL | Status: DC
  Filled 2018-01-30: qty 1

## 2018-01-30 MED ORDER — ALBUTEROL SULFATE (2.5 MG/3ML) 0.083% IN NEBU: 3.0000 mL | INHALATION_SOLUTION | Freq: Four times a day (QID) | RESPIRATORY_TRACT | Status: DC | PRN

## 2018-01-30 MED ORDER — ROPINIROLE HCL 1 MG PO TABS
4.0000 mg | ORAL_TABLET | Freq: Every day | ORAL | Status: DC
  Administered 2018-01-30 – 2018-01-31 (×2): 4 mg via ORAL
  Filled 2018-01-30 (×2): qty 4

## 2018-01-30 MED ORDER — PROMETHAZINE HCL 25 MG/ML IJ SOLN
12.5000 mg | Freq: Once | INTRAMUSCULAR | Status: AC
  Administered 2018-01-30: 12.5 mg via INTRAVENOUS
  Filled 2018-01-30: qty 1

## 2018-01-30 MED ORDER — BECLOMETHASONE DIPROPIONATE 80 MCG/ACT IN AERS: 2.0000 | INHALATION_SPRAY | Freq: Two times a day (BID) | RESPIRATORY_TRACT | Status: DC

## 2018-01-30 MED ORDER — FERROUS SULFATE 325 (65 FE) MG PO TABS
325.0000 mg | ORAL_TABLET | Freq: Every day | ORAL | Status: DC
  Administered 2018-01-30 – 2018-02-01 (×3): 325 mg via ORAL
  Filled 2018-01-30 (×3): qty 1

## 2018-01-30 MED ORDER — DICYCLOMINE HCL 10 MG PO CAPS: 10.0000 mg | ORAL_CAPSULE | Freq: Three times a day (TID) | ORAL | Status: DC | PRN

## 2018-01-30 MED ORDER — POTASSIUM CHLORIDE CRYS ER 20 MEQ PO TBCR
40.0000 meq | EXTENDED_RELEASE_TABLET | Freq: Two times a day (BID) | ORAL | Status: AC
  Administered 2018-01-30 (×2): 40 meq via ORAL
  Filled 2018-01-30 (×2): qty 2

## 2018-01-30 MED ORDER — SENNOSIDES-DOCUSATE SODIUM 8.6-50 MG PO TABS: 1.0000 | ORAL_TABLET | Freq: Every evening | ORAL | Status: DC | PRN

## 2018-01-30 MED ORDER — SODIUM CHLORIDE 0.9% FLUSH
3.0000 mL | Freq: Two times a day (BID) | INTRAVENOUS | Status: DC
  Administered 2018-01-30 – 2018-02-01 (×6): 3 mL via INTRAVENOUS

## 2018-01-30 MED ORDER — MORPHINE SULFATE (PF) 2 MG/ML IV SOLN
2.0000 mg | INTRAVENOUS | Status: DC | PRN
  Administered 2018-01-31 (×2): 2 mg via INTRAVENOUS
  Filled 2018-01-30 (×2): qty 1

## 2018-01-30 MED ORDER — BISACODYL 5 MG PO TBEC: 5.0000 mg | DELAYED_RELEASE_TABLET | Freq: Every day | ORAL | Status: DC | PRN

## 2018-01-30 MED ORDER — MORPHINE SULFATE (PF) 4 MG/ML IV SOLN
4.0000 mg | INTRAVENOUS | Status: DC | PRN
  Administered 2018-01-30 – 2018-02-01 (×12): 4 mg via INTRAVENOUS
  Filled 2018-01-30 (×12): qty 1

## 2018-01-30 MED ORDER — ACETAMINOPHEN 325 MG PO TABS: 650.0000 mg | ORAL_TABLET | ORAL | Status: DC | PRN

## 2018-01-30 MED ORDER — POTASSIUM CHLORIDE CRYS ER 20 MEQ PO TBCR
40.0000 meq | EXTENDED_RELEASE_TABLET | Freq: Once | ORAL | Status: AC
  Administered 2018-01-30: 40 meq via ORAL
  Filled 2018-01-30: qty 2

## 2018-01-30 MED ORDER — PROMETHAZINE HCL 25 MG/ML IJ SOLN
12.5000 mg | Freq: Four times a day (QID) | INTRAMUSCULAR | Status: DC | PRN
  Administered 2018-01-30 (×2): 25 mg via INTRAVENOUS
  Administered 2018-01-30: 12.5 mg via INTRAVENOUS
  Administered 2018-01-30: 25 mg via INTRAVENOUS
  Administered 2018-01-31: 12.5 mg via INTRAVENOUS
  Administered 2018-01-31 – 2018-02-01 (×3): 25 mg via INTRAVENOUS
  Filled 2018-01-30 (×9): qty 1

## 2018-01-30 MED ORDER — VITAMIN B-12 1000 MCG PO TABS
500.0000 ug | ORAL_TABLET | Freq: Every day | ORAL | Status: DC
  Administered 2018-01-30 – 2018-02-01 (×3): 500 ug via ORAL
  Filled 2018-01-30 (×3): qty 1

## 2018-01-30 MED ORDER — SPIRONOLACTONE 25 MG PO TABS
50.0000 mg | ORAL_TABLET | Freq: Every day | ORAL | Status: DC
  Filled 2018-01-30: qty 2

## 2018-01-30 MED ORDER — ADULT MULTIVITAMIN W/MINERALS CH
1.0000 | ORAL_TABLET | Freq: Every day | ORAL | Status: DC
  Administered 2018-01-30 – 2018-02-01 (×3): 1 via ORAL
  Filled 2018-01-30 (×3): qty 1

## 2018-01-30 NOTE — H&P (Addendum)
[facility] [facility] [place] NAME: Brittany Nguyen    MR#:  [id number]  DATE OF BIRTH:  09-Nov-1965  DATE OF ADMISSION:  01/29/2018  PRIMARY CARE PHYSICIAN: [name], MD   REQUESTING/REFERRING PHYSICIAN: Paulette Blanch, MD  CHIEF COMPLAINT:   Chief Complaint  Patient presents with  . Numbness  . Chest Pain  . Headache    HISTORY OF PRESENT ILLNESS:  Brittany Nguyen  is a 52 y.o. female with a known history of chronic systolic CHF/NICM (EF 06-30%), AICD/PPM (Medtronic Evera), remote heart failure monitoring (Medtronic Optivol), p/w CP and LH/near-syncope. Followed by [facility]. Had a cardiac cath 02/23/2017, "normal coronary arteriograms". Recent admit 05/12-05/14. Also had recent C. Diff, s/p Fidaxomicin x10d (05/09-05/19). Now p/w c/o 1wk Hx LH/near-syncope, positional, occurs predominantly when standing up from sitting/lying position. Endorses 2d Hx CP, characterized as midchest heaviness/pressure radiating to back, neck, L shoulder, L arm. Pain has been constant, but waxes/wanes in severity. Pain is sometimes sharp. Pain is exertional, but non-pleuritic, non-positional and non-reproducible w/ chest wall palpation. Endorses thirst/dry mouth/dehydration, diaphoresis, fatigue/malaise/generalized weakness, decreased exercise tolerance. Denies SOB, palpitations, AP, N/V/D, cough, hemoptysis, wheezing, night sweats, rigors, LOC, urinary symptoms.  PAST MEDICAL HISTORY:   Past Medical History:  Diagnosis Date  . Asthma 2013  . CHF (congestive heart failure) ([place])   . Diverticulitis 2010  . DVT (deep venous thrombosis) ([place])   . Endometriosis 1990  . Heart disease 2013  . Hx MRSA infection   . Lump or mass in breast   . Restless leg     PAST SURGICAL HISTORY:   Past Surgical History:  Procedure Laterality Date  . ABDOMINAL HYSTERECTOMY     age 5  . CARDIAC DEFIBRILLATOR PLACEMENT    . CESAREAN SECTION    . CHOLECYSTECTOMY    .  COLECTOMY    . NASAL SINUS SURGERY  2012    SOCIAL HISTORY:   Social History   Tobacco Use  . Smoking status: Never Smoker  . Smokeless tobacco: Never Used  Substance Use Topics  . Alcohol use: No    FAMILY HISTORY:   Family History  Problem Relation Age of Onset  . Cancer Mother 30       ovarian  . CAD Mother   . Cancer Father 60       brain  . CAD Father   . Cancer Daughter 18       skin  . Cancer Maternal Aunt 34       breast    DRUG ALLERGIES:   Allergies  Allergen Reactions  . Contrast Media [Iodinated Diagnostic Agents] Shortness Of Breath  . Metrizamide Shortness Of Breath  . Penicillins Hives and Other (See Comments)    Other reaction(s): Other (See Comments) Has patient had a PCN reaction causing immediate rash, facial/tongue/throat swelling, SOB or lightheadedness with hypotension: Yes Has patient had a PCN reaction causing severe rash involving mucus membranes or skin necrosis: No Has patient had a PCN reaction that required hospitalization No Has patient had a PCN reaction occurring within the last 10 years: No If all of the above answers are "NO", then may proceed with Cephalosporin use. Has patient had a PCN reaction causing immediate rash, facial/tongue/throat swelling, SOB or lightheadedness with hypotension: Yes Has patient had a PCN reaction causing severe rash involving mucus membranes or skin necrosis: No Has patient had a PCN reaction that required hospitalization No Has patient had a PCN reaction  occurring within the last 10 years: No If all of the above answers are "NO", then may proceed with Cephalosporin use.   . Isosorbide Nitrate     Other reaction(s): Headache  . Latex Hives  . Ondansetron Other (See Comments)    Severe headache  . Zofran [Ondansetron Hcl] Other (See Comments)    Causes migraines  . Povidone-Iodine Rash    Blistering rash  . Pulmicort [Budesonide] Itching    REVIEW OF SYSTEMS:   Review of Systems    Constitutional: Positive for diaphoresis and malaise/fatigue. Negative for chills, fever and weight loss.  HENT: Negative for congestion, ear pain, hearing loss, nosebleeds, sinus pain, sore throat and tinnitus.   Eyes: Negative for blurred vision, double vision and photophobia.  Respiratory: Negative for cough, hemoptysis, sputum production, shortness of breath and wheezing.   Cardiovascular: Positive for chest pain. Negative for palpitations, orthopnea, claudication, leg swelling and PND.  Gastrointestinal: Negative for abdominal pain, blood in stool, constipation, diarrhea, heartburn, melena, nausea and vomiting.  Genitourinary: Negative for dysuria, frequency, hematuria and urgency.  Musculoskeletal: Positive for back pain and neck pain. Negative for joint pain and myalgias.  Skin: Negative for itching and rash.  Neurological: Positive for dizziness and weakness. Negative for tingling, tremors, sensory change, speech change, focal weakness, seizures, loss of consciousness and headaches.  Psychiatric/Behavioral: Negative for memory loss. The patient does not have insomnia.     MEDICATIONS AT HOME:   Prior to Admission medications   Medication Sig Start Date End Date Taking? Authorizing Provider  acetaminophen (TYLENOL) 325 MG tablet Take 2 tablets (650 mg total) by mouth every 6 (six) hours as needed for mild pain (or Fever >/= 101). 11/01/17  Yes Gouru, Illene Silver, MD  albuterol (ACCUNEB) 1.25 MG/3ML nebulizer solution Take 3 mLs by nebulization every 6 (six) hours as needed for wheezing. 11/09/17  Yes [provider]  beclomethasone (QVAR) 80 MCG/ACT inhaler Inhale 2 puffs into the lungs 2 (two) times daily. 06/05/17 06/05/18 Yes Laverle Hobby, MD  cyanocobalamin 500 MCG tablet Take 500 mcg by mouth daily.   Yes [provider]  ferrous sulfate 325 (65 FE) MG tablet Take 325 mg by mouth daily with breakfast.   Yes [provider]  losartan (COZAAR) 25 MG tablet  Take 12.5 mg by mouth 2 (two) times daily.   Yes [provider]  metoprolol succinate (TOPROL-XL) 100 MG 24 hr tablet Take 1 tablet (100 mg total) by mouth daily. Take with or immediately following a meal. 01/16/18  Yes Wieting, Richard, MD  Multiple Vitamin (MULTIVITAMIN WITH MINERALS) TABS tablet Take 1 tablet by mouth daily.   Yes [provider]  nitroGLYCERIN (NITROSTAT) 0.4 MG SL tablet Place 1 tablet (0.4 mg total) under the tongue every 5 (five) minutes as needed for chest pain. 01/24/17  Yes Henreitta Leber, MD  pantoprazole (PROTONIX) 40 MG tablet Take 40 mg by mouth 2 (two) times daily. 01/02/18  Yes [provider]  rOPINIRole (REQUIP) 2 MG tablet Take 2 tablets (4 mg total) by mouth at bedtime. 2-4  Mg per night 01/16/18  Yes Wieting, Richard, MD  sertraline (ZOLOFT) 50 MG tablet Take 50 mg by mouth daily.   Yes [provider]  spironolactone (ALDACTONE) 50 MG tablet Take 50 mg by mouth daily.   Yes [provider]  traMADol (ULTRAM) 50 MG tablet Take 1 tablet (50 mg total) by mouth every 6 (six) hours as needed. 11/01/17  Yes Nicholes Mango, MD  dicyclomine (BENTYL) 10 MG capsule Take 10 mg by mouth 3 (three) times daily as needed (abdominal pain).  12/29/17   [provider]  prochlorperazine (COMPAZINE) 10 MG tablet Take 1 tablet (10 mg total) by mouth every 6 (six) hours as needed for nausea or vomiting. Patient not taking: Reported on 01/29/2018 01/16/18   Loletha Grayer, MD      VITAL SIGNS:  Blood pressure 111/72, pulse (!) 106, temperature 98.4 F (36.9 C), temperature source Oral, resp. rate 20, height 5\' 5"  (1.651 m), weight 99.8 kg (220 lb), last menstrual period 01/22/2012, SpO2 96 %.  PHYSICAL EXAMINATION:  Physical Exam  Constitutional: She is oriented to person, place, and time. She appears well-developed and well-nourished. She is active and cooperative.  Non-toxic appearance. She does not have a sickly appearance. She  does not appear ill. No distress. She is not intubated.  HENT:  Head: Normocephalic and atraumatic.  Mouth/Throat: Oropharynx is clear and moist. No oropharyngeal exudate.  Eyes: Conjunctivae, EOM and lids are normal. No scleral icterus.  Neck: Neck supple. No JVD present. No thyromegaly present.  Cardiovascular: Normal rate, regular rhythm, S1 normal and S2 normal.  Occasional extrasystoles are present. Exam reveals no gallop, no S3, no S4, no distant heart sounds and no friction rub.  No murmur heard. Pulmonary/Chest: Effort normal and breath sounds normal. No accessory muscle usage or stridor. No apnea, no tachypnea and no bradypnea. She is not intubated. No respiratory distress. She has no decreased breath sounds. She has no wheezes. She has no rhonchi. She has no rales.  Abdominal: Soft. Bowel sounds are normal. She exhibits no distension. There is no tenderness. There is no rebound and no guarding.  Musculoskeletal: Normal range of motion. She exhibits no edema or tenderness.       Right lower leg: Normal. She exhibits no tenderness and no edema.       Left lower leg: Normal. She exhibits no tenderness and no edema.  Lymphadenopathy:    She has no cervical adenopathy.  Neurological: She is alert and oriented to person, place, and time. She is not disoriented.  Skin: Skin is warm, dry and intact. No rash noted. She is not diaphoretic. No erythema.  Psychiatric: She has a normal mood and affect. Her speech is normal and behavior is normal. Judgment and thought content normal. Her mood appears not anxious. She is not agitated. Cognition and memory are normal.    Non-tachycardic on exam, regular w/ occasional premature beat. (+) L chest AICD/PPM. LABORATORY PANEL:   CBC Recent Labs  Lab 01/29/18 2124  WBC 10.3  HGB 11.6*  HCT 34.4*  PLT 273   ------------------------------------------------------------------------------------------------------------------  Chemistries  Recent  Labs  Lab 01/27/18 0108 01/29/18 2124  NA 138 138  K 3.0* 3.2*  CL 98* 101  CO2 30 29  GLUCOSE 128* 116*  BUN 17 12  CREATININE 1.03* 0.94  CALCIUM 9.6 9.2  AST 28  --   ALT 23  --   ALKPHOS 112  --   BILITOT 0.3  --    ------------------------------------------------------------------------------------------------------------------  Cardiac Enzymes Recent Labs  Lab 01/29/18 2124  TROPONINI <0.03   ------------------------------------------------------------------------------------------------------------------  RADIOLOGY:  Dg Chest 2 View  Result Date: 01/29/2018 CLINICAL DATA:  Acute onset of left-sided chest pain, radiating to the left side of the neck. Left arm numbness. Dizziness. EXAM: CHEST - 2 VIEW COMPARISON:  Chest radiograph performed 01/27/2018 FINDINGS: The lungs are well-aerated and clear. There is no evidence of focal  opacification, pleural effusion or pneumothorax. The heart is borderline normal in size. A pacemaker/AICD is noted at the left chest wall, with leads ending at the right atrium and right ventricle. No acute osseous abnormalities are seen. Clips are noted within the right upper quadrant, reflecting prior cholecystectomy. IMPRESSION: No acute cardiopulmonary process seen. Electronically Signed   By: Garald Balding M.D.   On: 01/29/2018 21:43   IMPRESSION AND PLAN:   A/P: 33M CP, LH/near-syncope, dehydration, hypokalemia, hyperglycemia, normocytic anemia. -Initial CXR, EKG, Troponin reassuring -Had clean cath 02/2017 -Trend Trop-I -Tele, cardiac monitoring -ASA81 -Not on Statin -c/w beta blocker, ARB, Aldactone -Consider changing ARB to Entresto -Morphine, NTG, O2 -Orthostatic VS -FSG qHS/AC -Neuro checks -Fall precautions -No IVF, c/w home diuretic (Aldactone) despite dehydration, given degree of cardiomyopathy and risk of precipitating heart failure. Will instead encourage PO fluids. Pt seems intelligent/capable, will liberalize her PO free  water intake, as I do not expect her to volume overload herself. In case it was not obvious, the pt is not presently in an acute CHF exacerbation. -Echo pending -Consider device interrogation -Consider Cardiology consultation [facility]) -Replete K+ -Mag level pending -Normocytic anemia, mild, likely anemia of chronic disease, no evidence for acute blood loss -c/w home meds -Cardiac diet -Lovenox -Full code -Observation, < 2 midnights   All the records are reviewed and case discussed with ED provider. Management plans discussed with the patient, family and they are in agreement.  CODE STATUS: Full code  TOTAL TIME TAKING CARE OF THIS PATIENT: 90 minutes.    Arta Silence M.D on 01/30/2018 at 1:06 AM  Between 7am to 6pm - Pager - [phone]  After 6pm go to [email or web address] - Proofreader  [facility]  Office  [phone]  CC: Primary care physician; [name], MD   Note: This dictation was prepared with Dragon dictation along with smaller phrase technology. Any transcriptional errors that result from this process are unintentional.

## 2018-01-30 NOTE — Progress Notes (Signed)
Same day rounding progress note  70M CP, LH/near-syncope, dehydration, hypokalemia, hyperglycemia, normocytic anemia.  * Syncope/presyncope: could be due to hypotension, will c/s cardio, neg serial troponins  * hypotension/tachycardia: IVFs, and hold BP meds, May need rate controlling meds  * hypokalemia: replete and recheck, check mg  * depression: continue zoloft  Time spent: 20 mins

## 2018-01-30 NOTE — Progress Notes (Signed)
*  PRELIMINARY RESULTS* Echocardiogram 2D Echocardiogram has been performed.  Brittany Nguyen 01/30/2018, 12:10 PM

## 2018-01-30 NOTE — Progress Notes (Signed)
Pt c/o L eye pain, swelling, itching, redness, and sticky yellow drainage. On call MD paged.

## 2018-01-31 ENCOUNTER — Observation Stay: Payer: Medicaid Other

## 2018-01-31 DIAGNOSIS — I200 Unstable angina: Secondary | ICD-10-CM

## 2018-01-31 DIAGNOSIS — Z8614 Personal history of Methicillin resistant Staphylococcus aureus infection: Secondary | ICD-10-CM

## 2018-01-31 DIAGNOSIS — Z95810 Presence of automatic (implantable) cardiac defibrillator: Secondary | ICD-10-CM

## 2018-01-31 DIAGNOSIS — Z91040 Latex allergy status: Secondary | ICD-10-CM

## 2018-01-31 DIAGNOSIS — Z7189 Other specified counseling: Secondary | ICD-10-CM

## 2018-01-31 DIAGNOSIS — I5042 Chronic combined systolic (congestive) and diastolic (congestive) heart failure: Secondary | ICD-10-CM

## 2018-01-31 DIAGNOSIS — I5022 Chronic systolic (congestive) heart failure: Secondary | ICD-10-CM

## 2018-01-31 DIAGNOSIS — I2510 Atherosclerotic heart disease of native coronary artery without angina pectoris: Secondary | ICD-10-CM

## 2018-01-31 DIAGNOSIS — J449 Chronic obstructive pulmonary disease, unspecified: Secondary | ICD-10-CM

## 2018-01-31 DIAGNOSIS — I951 Orthostatic hypotension: Secondary | ICD-10-CM

## 2018-01-31 DIAGNOSIS — Z880 Allergy status to penicillin: Secondary | ICD-10-CM

## 2018-01-31 DIAGNOSIS — R55 Syncope and collapse: Secondary | ICD-10-CM

## 2018-01-31 DIAGNOSIS — Z91041 Radiographic dye allergy status: Secondary | ICD-10-CM

## 2018-01-31 DIAGNOSIS — R079 Chest pain, unspecified: Secondary | ICD-10-CM

## 2018-01-31 DIAGNOSIS — I252 Old myocardial infarction: Secondary | ICD-10-CM

## 2018-01-31 DIAGNOSIS — Z79899 Other long term (current) drug therapy: Secondary | ICD-10-CM

## 2018-01-31 LAB — GLUCOSE, CAPILLARY
GLUCOSE-CAPILLARY: 98 mg/dL (ref 65–99)
GLUCOSE-CAPILLARY: 86 mg/dL (ref 65–99)
Glucose-Capillary: 95 mg/dL (ref 65–99)
Glucose-Capillary: 130 mg/dL — ABNORMAL HIGH (ref 65–99)

## 2018-01-31 LAB — CBC
HEMATOCRIT: 33.5 % — AB (ref 35.0–47.0)
Hemoglobin: 11.4 g/dL — ABNORMAL LOW (ref 12.0–16.0)
MCH: 27.6 pg (ref 26.0–34.0)
MCHC: 34.1 g/dL (ref 32.0–36.0)
MCV: 81 fL (ref 80.0–100.0)
PLATELETS: 263 10*3/uL (ref 150–440)
RBC: 4.14 MIL/uL (ref 3.80–5.20)
RDW: 18.4 % — AB (ref 11.5–14.5)
WBC: 9.6 10*3/uL (ref 3.6–11.0)

## 2018-01-31 LAB — BASIC METABOLIC PANEL
ANION GAP: 10 (ref 5–15)
BUN: 11 mg/dL (ref 6–20)
CALCIUM: 9.2 mg/dL (ref 8.9–10.3)
CO2: 27 mmol/L (ref 22–32)
Chloride: 102 mmol/L (ref 101–111)
Creatinine, Ser: 0.8 mg/dL (ref 0.44–1.00)
GFR calc non Af Amer: >60 mL/min (ref >60)
Glucose, Bld: 108 mg/dL — ABNORMAL HIGH (ref 65–99)
Potassium: 4.2 mmol/L (ref 3.5–5.1)
Sodium: 139 mmol/L (ref 135–145)

## 2018-01-31 LAB — HIV ANTIBODY (ROUTINE TESTING W REFLEX): HIV Screen 4th Generation wRfx: NONREACTIVE

## 2018-01-31 LAB — FIBRIN DERIVATIVES D-DIMER (ARMC ONLY): Fibrin derivatives D-dimer (ARMC): 827.2 ng/mL (FEU) — ABNORMAL HIGH (ref 0.00–499.00)

## 2018-01-31 MED ORDER — TECHNETIUM TC 99M DIETHYLENETRIAME-PENTAACETIC ACID
30.9210 | Freq: Once | INTRAVENOUS | Status: AC | PRN
  Administered 2018-01-31: 30.921 via RESPIRATORY_TRACT

## 2018-01-31 MED ORDER — TECHNETIUM TO 99M ALBUMIN AGGREGATED
4.2180 | Freq: Once | INTRAVENOUS | Status: AC | PRN
  Administered 2018-01-31: 4.218 via INTRAVENOUS

## 2018-01-31 MED ORDER — OFLOXACIN 0.3 % OP SOLN
1.0000 [drp] | Freq: Four times a day (QID) | OPHTHALMIC | Status: DC
  Administered 2018-01-31 – 2018-02-01 (×2): 1 [drp] via OPHTHALMIC
  Filled 2018-01-31: qty 5

## 2018-01-31 MED ORDER — METOPROLOL SUCCINATE ER 25 MG PO TB24
12.5000 mg | ORAL_TABLET | Freq: Every day | ORAL | Status: DC
  Administered 2018-02-01: 12.5 mg via ORAL
  Filled 2018-01-31 (×2): qty 1

## 2018-01-31 NOTE — Progress Notes (Signed)
[facility] [place] NAME: Brittany Nguyen    MR#:  [id number]  DATE OF BIRTH:  08/22/1966  SUBJECTIVE:  CHIEF COMPLAINT:   Chief Complaint  Patient presents with  . Numbness  . Chest Pain  . Headache  wants to go home, no new complaints REVIEW OF SYSTEMS:  Review of Systems  Constitutional: Negative for chills, fever and weight loss.  HENT: Negative for nosebleeds and sore throat.   Eyes: Negative for blurred vision.  Respiratory: Negative for cough, shortness of breath and wheezing.   Cardiovascular: Negative for chest pain, orthopnea, leg swelling and PND.  Gastrointestinal: Negative for abdominal pain, constipation, diarrhea, heartburn, nausea and vomiting.  Genitourinary: Negative for dysuria and urgency.  Musculoskeletal: Negative for back pain.  Skin: Negative for rash.  Neurological: Negative for dizziness, speech change, focal weakness and headaches.  Endo/Heme/Allergies: Does not bruise/bleed easily.  Psychiatric/Behavioral: Negative for depression.    DRUG ALLERGIES:   Allergies  Allergen Reactions  . Contrast Media [Iodinated Diagnostic Agents] Shortness Of Breath    Per CT report in 2014 pt had break through contrast reaction with hives and SOB following 13 hour prep. MSY   . Metrizamide Shortness Of Breath  . Penicillins Hives and Other (See Comments)    Other reaction(s): Other (See Comments) Has patient had a PCN reaction causing immediate rash, facial/tongue/throat swelling, SOB or lightheadedness with hypotension: Yes Has patient had a PCN reaction causing severe rash involving mucus membranes or skin necrosis: No Has patient had a PCN reaction that required hospitalization No Has patient had a PCN reaction occurring within the last 10 years: No If all of the above answers are "NO", then may proceed with Cephalosporin use. Has patient had a PCN reaction causing immediate rash, facial/tongue/throat swelling, SOB  or lightheadedness with hypotension: Yes Has patient had a PCN reaction causing severe rash involving mucus membranes or skin necrosis: No Has patient had a PCN reaction that required hospitalization No Has patient had a PCN reaction occurring within the last 10 years: No If all of the above answers are "NO", then may proceed with Cephalosporin use.   . Isosorbide Nitrate     Other reaction(s): Headache  . Latex Hives  . Ondansetron Other (See Comments)    Severe headache  . Zofran [Ondansetron Hcl] Other (See Comments)    Causes migraines  . Povidone-Iodine Rash    Blistering rash  . Pulmicort [Budesonide] Itching   VITALS:  Blood pressure (!) 113/57, pulse (!) 102, temperature 98.7 F (37.1 C), temperature source Oral, resp. rate 16, height 5\' 5"  (1.651 m), weight 101.1 kg (222 lb 14.4 oz), last menstrual period 01/22/2012, SpO2 97 %. PHYSICAL EXAMINATION:  Physical Exam  Constitutional: She is oriented to person, place, and time.  HENT:  Head: Normocephalic and atraumatic.  Eyes: Pupils are equal, round, and reactive to light. Conjunctivae and EOM are normal.  Neck: Normal range of motion. Neck supple. No tracheal deviation present. No thyromegaly present.  Cardiovascular: Normal rate, regular rhythm and normal heart sounds.  Pulmonary/Chest: Effort normal and breath sounds normal. No respiratory distress. She has no wheezes. She exhibits no tenderness.  Abdominal: Soft. Bowel sounds are normal. She exhibits no distension. There is no tenderness.  Musculoskeletal: Normal range of motion.  Neurological: She is alert and oriented to person, place, and time. No cranial nerve deficit.  Skin: Skin is warm and dry. No rash noted.   LABORATORY PANEL:  Female  CBC Recent Labs  Lab 01/31/18 0500  WBC 9.6  HGB 11.4*  HCT 33.5*  PLT 263   ------------------------------------------------------------------------------------------------------------------ Chemistries  Recent Labs    Lab 01/27/18 0108  01/30/18 0253 01/31/18 0500  NA 138   < >  --  139  K 3.0*   < >  --  4.2  CL 98*   < >  --  102  CO2 30   < >  --  27  GLUCOSE 128*   < >  --  108*  BUN 17   < >  --  11  CREATININE 1.03*   < >  --  0.80  CALCIUM 9.6   < >  --  9.2  MG  --   --  2.0  --   AST 28  --   --   --   ALT 23  --   --   --   ALKPHOS 112  --   --   --   BILITOT 0.3  --   --   --    < > = values in this interval not displayed.   RADIOLOGY:  Nm Pulmonary Perf And Vent  Result Date: 01/31/2018 CLINICAL DATA:  Short of breath for 3 weeks EXAM: NUCLEAR MEDICINE VENTILATION - PERFUSION LUNG SCAN TECHNIQUE: Ventilation images were obtained in multiple projections using inhaled aerosol Tc-66m DTPA. Perfusion images were obtained in multiple projections after intravenous injection of Tc-60m-MAA. RADIOPHARMACEUTICALS:  30.9 mCi of Tc-51m DTPA aerosol inhalation and 4.2 mCi Tc64m-MAA IV COMPARISON:  None. FINDINGS: Lateral imaging could not be performed limiting the exam. Ventilation: There is a nonsegmental defect in the lateral left mid lung zone secondary to the pacemaker device. There are no true segmental or subsegmental defects. Perfusion: There is a nonsegmental perfusion defect in the left mid lung zone laterally related to the pacemaker device. There are no true segmental or subsegmental defects. IMPRESSION: Low probability for pulmonary thromboembolism. Electronically Signed   By: Marybelle Killings M.D.   On: 01/31/2018 15:19   Dg Chest Port 1 View  Result Date: 01/31/2018 CLINICAL DATA:  Productive cough. EXAM: PORTABLE CHEST 1 VIEW COMPARISON:  Radiographs of Jan 29, 2018. FINDINGS: Stable cardiomediastinal silhouette. No pneumothorax or pleural effusion is noted. Left-sided pacemaker is unchanged in position. Both lungs are clear. The visualized skeletal structures are unremarkable. IMPRESSION: No acute cardiopulmonary abnormality seen. Electronically Signed   By: Marijo Conception, M.D.   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 difficile colitis.    1.  Presyncope -Likely multifactorial- poor p.o. intake, poor hydration, anemia - no orthostasis on recheck  2.  h/o recurrent DVTs -Patient with a prior history of unprovoked DVT x3 that was successfully treated with anticoagulation several years back - d-dimer which was elevated -severe allergic rxn to contrast dye in spite of 13 hrs prep - so radiology doesn't think CTA is an option VQ is read as low probability - d/w cardio - will c/s onco to see empiric anticoagulation should be started (d/w Onco)  3.  Chronic systolic CHF secondary to nonischemic cardiomyopathy/mild pulmonary hypertension: -She appears euvolemic on exam though this is somewhat difficult to assess secondary to body habitus -Echocardiogram during this admission actually shows a mildly improved LV systolic function with an  EF of 45%, prior EF 25% in 02/2017 -no orthostasis, start low dose metoprolol per cardio recommendation  4. Chest pain/ CAD: neg serial troponins  5. Hypokalemia: -Repleted  6.  Normocytic Anemia: -Stable  7. depression: continue zoloft       All the records are reviewed and case discussed with Care Management/Social Worker. Management plans discussed with the patient, Cardio, Onco and they are in agreement.  CODE STATUS: Full Code  TOTAL TIME TAKING CARE OF THIS PATIENT: 35 minutes.   More than 50% of the time was spent in counseling/coordination of care: YES  POSSIBLE D/C IN 1-2 DAYS, DEPENDING ON CLINICAL CONDITION.   Max Sane M.D on 01/31/2018 at 8:53 PM  Between 7am to 6pm - Pager - [phone]  After 6pm go to [email or web address] - Proofreader  [facility]  Office  [phone]  CC: Primary care physician; [name], MD  Note: This dictation was prepared with Dragon dictation along with smaller phrase technology. Any transcriptional errors that result from this process are unintentional.

## 2018-01-31 NOTE — Consult Note (Signed)
Cardiology Consultation:   Patient ID: Brittany Nguyen; [id number]; [birth year]   Admit date: 01/29/2018 Date of Consult: 01/31/2018  Primary Care Provider: [name], MD Primary Cardiologist: [facility]   Patient Profile:   Brittany Nguyen is a 52 y.o. female with a hx of nonobstructive CAD by [facility] in 05/7672, chronic systolic CHF secondary to nonischemic cardiomyopathy status post ICD placement on 03/24/2017 for primary prevention, prior DVT x3, strong family history of CAD and sudden cardiac death, asthma/reactive airway disease, morbid obesity, recent pneumonia, diverticulitis with ongoing diarrhea, sleep apnea on CPAP, possible OHS, thyroid nodules, and anxiety/depression who is being seen today for the evaluation of syncope and hypotension in the setting of dehydration, diarrhea, and orthostasis at the request of Dr. Manuella Ghazi.  History of Present Illness:   [name] was noted in 08/2016 to have an EF of 25 to 30% with anteroseptal wall motion abnormality's.  Prior echocardiogram in 11/2015 showed an EF of 40 to 45% per H&P in 08/2016 though results are not available for review.  Patient's symptoms were felt to be more consistent with nonischemic cardiomyopathy and ischemic evaluation was not undertaken at that time given patient had recently had a cardiac catheterization in 02/2015 that was nonobstructive.  During her admission in 08/2016 she was discharged home on low-dose beta-blocker as well as ACE inhibitor.  She underwent stress testing in 12/2016 with results not being available for review.  Despite optimization of evidence-based heart failure therapy, repeat echocardiogram in 02/2017 showed a persistently low EF of approximately 25% with moderate LVH, grade 1 diastolic dysfunction, diffuse LV hypokinesis with an akinetic anterior septal, inferior septal, and inferior segments, normal RVSF, mild TR.  She underwent right and left heart catheterization on 02/23/2017 at [facility] that showed  normal coronary arteries with mild pulmonary hypertension and a mildly elevated pulmonary capillary wedge pressure.  Cardiac MRI in 11/2016 was without evidence of sarcoidosis, although study was poor.  She reported a recent history of pneumonia in 11/2017.  She was most recently seen by [facility] cardiology on 12/07/2017 with recommendation to continue Lasix 40 mg twice daily with an extra 40 mg as needed for swelling or weight gain.  She was also continued on losartan 12.5 mg twice daily, spironolactone 50 mg daily, and Toprol-XL 100 mg nightly.  She was noted to have persistent tachycardia at that time with consideration for Corlanor to be utilized in the future.  She had recently been diagnosed with C. difficile in early May, 2019.  However, she has continued to have profuse watery diarrhea for at least the past 1 month.  Patient has noted a slight progression in her fatigue, weakness, shortness of breath, and more recently left-sided upper chest wall and left shoulder pain that is worse with positional movement.  She is also noted a worsening in her orthopnea.  She reports subjective lower extremity and hand swelling.  At times, she has noted her blood pressure to be in the 80s over 40s at home leading to the holding of her Lasix.  She notes positional dizziness.  She was seen in the [facility] ED on 5/25 with chest discomfort that improved prior to being evaluated by the physician and she left without ED evaluation.  She subsequently presented to the [facility] ED the same day with what appears to be return of chest discomfort though again she was not evaluated by the MD and went home.  Because of her persistent weakness, and dizziness with associated near syncope  she presented to [facility] on 5/27 where she was noted to be orthostatic with blood pressure dropping into the 70s over 40s.  She has ruled out.  Her evidence-based heart failure therapies have been held.  Reportedly, orthostatic vital signs were checked in the ED though  these are not available for review.  She has been hydrated with IV fluids though is currently hydrating with p.o. intake.  She remains on room air.  Chest x-ray showed no acute cardiopulmonary process.  EKG as detailed below.  Currently, she continues to note significant fatigue, weakness, shortness of breath, orthopnea, bilateral lower extremity and hand swelling, left-sided anterior wall chest pain and left shoulder pain that is worse with positional movement.    Past Medical History:  Diagnosis Date  . Asthma 2013  . CHF (congestive heart failure) ([place])   . Diverticulitis 2010  . DVT (deep venous thrombosis) ([place])   . Endometriosis 1990  . Heart disease 2013  . Hx MRSA infection   . Lump or mass in breast   . Restless leg     Past Surgical History:  Procedure Laterality Date  . ABDOMINAL HYSTERECTOMY     age 69  . CARDIAC DEFIBRILLATOR PLACEMENT    . CESAREAN SECTION    . CHOLECYSTECTOMY    . COLECTOMY    . NASAL SINUS SURGERY  2012     Home Meds: Prior to Admission medications   Medication Sig Start Date End Date Taking? Authorizing Provider  acetaminophen (TYLENOL) 325 MG tablet Take 2 tablets (650 mg total) by mouth every 6 (six) hours as needed for mild pain (or Fever >/= 101). 11/01/17  Yes Gouru, Illene Silver, MD  albuterol (ACCUNEB) 1.25 MG/3ML nebulizer solution Take 3 mLs by nebulization every 6 (six) hours as needed for wheezing. 11/09/17  Yes [provider]  beclomethasone (QVAR) 80 MCG/ACT inhaler Inhale 2 puffs into the lungs 2 (two) times daily. 06/05/17 06/05/18 Yes Laverle Hobby, MD  cyanocobalamin 500 MCG tablet Take 500 mcg by mouth daily.   Yes [provider]  ferrous sulfate 325 (65 FE) MG tablet Take 325 mg by mouth daily with breakfast.   Yes [provider]  losartan (COZAAR) 25 MG tablet Take 12.5 mg by mouth 2 (two) times daily.   Yes [provider]  metoprolol succinate (TOPROL-XL) 100 MG 24 hr tablet Take 1 tablet  (100 mg total) by mouth daily. Take with or immediately following a meal. 01/16/18  Yes Wieting, Richard, MD  Multiple Vitamin (MULTIVITAMIN WITH MINERALS) TABS tablet Take 1 tablet by mouth daily.   Yes [provider]  nitroGLYCERIN (NITROSTAT) 0.4 MG SL tablet Place 1 tablet (0.4 mg total) under the tongue every 5 (five) minutes as needed for chest pain. 01/24/17  Yes Henreitta Leber, MD  pantoprazole (PROTONIX) 40 MG tablet Take 40 mg by mouth 2 (two) times daily. 01/02/18  Yes [provider]  rOPINIRole (REQUIP) 2 MG tablet Take 2 tablets (4 mg total) by mouth at bedtime. 2-4  Mg per night 01/16/18  Yes Wieting, Richard, MD  sertraline (ZOLOFT) 50 MG tablet Take 50 mg by mouth daily.   Yes [provider]  spironolactone (ALDACTONE) 50 MG tablet Take 50 mg by mouth daily.   Yes [provider]  traMADol (ULTRAM) 50 MG tablet Take 1 tablet (50 mg total) by mouth every 6 (six) hours as needed. 11/01/17  Yes Gouru, Illene Silver, MD  dicyclomine (BENTYL) 10 MG capsule Take 10 mg by mouth  3 (three) times daily as needed (abdominal pain).  12/29/17   [provider]  prochlorperazine (COMPAZINE) 10 MG tablet Take 1 tablet (10 mg total) by mouth every 6 (six) hours as needed for nausea or vomiting. Patient not taking: Reported on 01/29/2018 01/16/18   Loletha Grayer, MD    Inpatient Medications: Scheduled Meds: . aspirin EC  81 mg Oral Daily  . budesonide (PULMICORT) nebulizer solution  0.25 mg Nebulization BID  . enoxaparin (LOVENOX) injection  40 mg Subcutaneous Q24H  . ferrous sulfate  325 mg Oral Q breakfast  . multivitamin with minerals  1 tablet Oral Daily  . pantoprazole  40 mg Oral BID  . rOPINIRole  4 mg Oral QHS  . sertraline  50 mg Oral Daily  . sodium chloride flush  3 mL Intravenous Q12H  . vitamin B-12  500 mcg Oral Daily   Continuous Infusions: . diphenhydrAMINE Stopped (01/31/18 0330)   PRN Meds: acetaminophen, albuterol, bisacodyl,  dicyclomine, diphenhydrAMINE, morphine injection **OR** morphine injection, nitroGLYCERIN, promethazine, senna-docusate, traMADol  Allergies:   Allergies  Allergen Reactions  . Contrast Media [Iodinated Diagnostic Agents] Shortness Of Breath  . Metrizamide Shortness Of Breath  . Penicillins Hives and Other (See Comments)    Other reaction(s): Other (See Comments) Has patient had a PCN reaction causing immediate rash, facial/tongue/throat swelling, SOB or lightheadedness with hypotension: Yes Has patient had a PCN reaction causing severe rash involving mucus membranes or skin necrosis: No Has patient had a PCN reaction that required hospitalization No Has patient had a PCN reaction occurring within the last 10 years: No If all of the above answers are "NO", then may proceed with Cephalosporin use. Has patient had a PCN reaction causing immediate rash, facial/tongue/throat swelling, SOB or lightheadedness with hypotension: Yes Has patient had a PCN reaction causing severe rash involving mucus membranes or skin necrosis: No Has patient had a PCN reaction that required hospitalization No Has patient had a PCN reaction occurring within the last 10 years: No If all of the above answers are "NO", then may proceed with Cephalosporin use.   . Isosorbide Nitrate     Other reaction(s): Headache  . Latex Hives  . Ondansetron Other (See Comments)    Severe headache  . Zofran [Ondansetron Hcl] Other (See Comments)    Causes migraines  . Povidone-Iodine Rash    Blistering rash  . Pulmicort [Budesonide] Itching    Social History:   Social History   Socioeconomic History  . Marital status: Married    Spouse name: Not on file  . Number of children: Not on file  . Years of education: Not on file  . Highest education level: Not on file  Occupational History  . Occupation: works at [employer].  Social Needs  . Financial resource strain: Not on file  . Food insecurity:    Worry: Not on file      Inability: Not on file  . Transportation needs:    Medical: Not on file    Non-medical: Not on file  Tobacco Use  . Smoking status: Never Smoker  . Smokeless tobacco: Never Used  Substance and Sexual Activity  . Alcohol use: No  . Drug use: No  . Sexual activity: Yes    Birth control/protection: Post-menopausal  Lifestyle  . Physical activity:    Days per week: Not on file    Minutes per session: Not on file  . Stress: Not on file  Relationships  . Social connections:  Talks on phone: Not on file    Gets together: Not on file    Attends religious service: Not on file    Active member of club or organization: Not on file    Attends meetings of clubs or organizations: Not on file    Relationship status: Not on file  . Intimate partner violence:    Fear of current or ex partner: Not on file    Emotionally abused: Not on file    Physically abused: Not on file    Forced sexual activity: Not on file  Other Topics Concern  . Not on file  Social History Narrative  . Not on file     Family History:   Family History  Problem Relation Age of Onset  . Cancer Mother 30       ovarian  . CAD Mother   . Cancer Father 1       brain  . CAD Father   . Cancer Daughter 18       skin  . Cancer Maternal Aunt 34       breast    ROS:  Review of Systems  Constitutional: Positive for malaise/fatigue. Negative for chills, diaphoresis, fever and weight loss.  HENT: Negative for congestion.   Eyes: Negative for discharge and redness.  Respiratory: Positive for shortness of breath. Negative for cough, hemoptysis, sputum production and wheezing.   Cardiovascular: Positive for chest pain, orthopnea and leg swelling. Negative for palpitations, claudication and PND.  Gastrointestinal: Positive for abdominal pain and diarrhea. Negative for blood in stool, constipation, heartburn, melena, nausea and vomiting.  Genitourinary: Negative for hematuria.  Musculoskeletal: Positive for joint  pain and myalgias. Negative for falls.  Skin: Negative for rash.  Neurological: Positive for weakness. Negative for dizziness, tingling, tremors, sensory change, speech change, focal weakness and loss of consciousness.  Endo/Heme/Allergies: Does not bruise/bleed easily.  Psychiatric/Behavioral: Negative for substance abuse. The patient is not nervous/anxious.   All other systems reviewed and are negative.     Physical Exam/Data:   Vitals:   01/30/18 1935 01/31/18 0359 01/31/18 0736 01/31/18 0843  BP: 104/69 107/64  104/65  Pulse: (!) 101 (!) 103  94  Resp: 16 18  18   Temp: 98.7 F (37.1 C) 98.4 F (36.9 C)    TempSrc: Oral Oral    SpO2: 97% 94% 96% 95%  Weight:  222 lb 14.4 oz (101.1 kg)    Height:        Intake/Output Summary (Last 24 hours) at 01/31/2018 0940 Last data filed at 01/31/2018 0402 Gross per 24 hour  Intake 50 ml  Output 950 ml  Net -900 ml   Filed Weights   01/30/18 0149 01/30/18 0500 01/31/18 0359  Weight: 221 lb (100.2 kg) 221 lb (100.2 kg) 222 lb 14.4 oz (101.1 kg)   Body mass index is 37.09 kg/m.   Physical Exam: General: Well developed, well nourished, in no acute distress. Head: Normocephalic, atraumatic, sclera non-icteric, no xanthomas, nares without discharge.  Neck: Negative for carotid bruits. JVD not elevated. Lungs: Clear bilaterally to auscultation without wheezes, rales, or rhonchi. Breathing is unlabored. Heart: RRR with S1 S2. No murmurs, rubs, or gallops appreciated. Abdomen: Obese, soft, non-tender, non-distended with normoactive bowel sounds. No hepatomegaly. No rebound/guarding. No obvious abdominal masses. Msk:  Strength and tone appear normal for age. Extremities: No clubbing or cyanosis. No edema. Distal pedal pulses are 2+ and equal bilaterally.  No objective evidence of significant swelling noted. Neuro: Alert and  oriented X 3. No facial asymmetry. No focal deficit. Moves all extremities spontaneously. Psych:  Responds to  questions appropriately with a normal affect.   EKG:  The EKG was personally reviewed and demonstrates: Sinus tachycardia, 114 bpm, PACs, poor R wave progression Telemetry:  Telemetry was personally reviewed and demonstrates: Sinus rhythm  Weights: Filed Weights   01/30/18 0149 01/30/18 0500 01/31/18 0359  Weight: 221 lb (100.2 kg) 221 lb (100.2 kg) 222 lb 14.4 oz (101.1 kg)    Relevant CV Studies: [facility] 02/2017: Impression: 1 - Normal Coronary Arteriograms 2 - Mild pulmonary hypertension associated with mildly elevated PCW  Laboratory Data:  Chemistry Recent Labs  Lab 01/27/18 0108 01/29/18 2124 01/31/18 0500  NA 138 138 139  K 3.0* 3.2* 4.2  CL 98* 101 102  CO2 30 29 27   GLUCOSE 128* 116* 108*  BUN 17 12 11   CREATININE 1.03* 0.94 0.80  CALCIUM 9.6 9.2 9.2  GFRNONAA >60 >60 >60  GFRAA >60 >60 >60  ANIONGAP 10 8 10     Recent Labs  Lab 01/27/18 0108  PROT 8.3*  ALBUMIN 4.2  AST 28  ALT 23  ALKPHOS 112  BILITOT 0.3   Hematology Recent Labs  Lab 01/27/18 0108 01/29/18 2124 01/31/18 0500  WBC 13.9* 10.3 9.6  RBC 4.41 4.29 4.14  HGB 11.9* 11.6* 11.4*  HCT 35.2 34.4* 33.5*  MCV 79.6* 80.1 81.0  MCH 26.9 27.1 27.6  MCHC 33.8 33.9 34.1  RDW 18.3* 17.6* 18.4*  PLT 315 273 263   Cardiac Enzymes Recent Labs  Lab 01/27/18 0108 01/29/18 2124 01/30/18 0253 01/30/18 0509  TROPONINI <0.03 <0.03 <0.03 <0.03   No results for input(s): TROPIPOC in the last 168 hours.  BNPNo results for input(s): BNP, PROBNP in the last 168 hours.  DDimer No results for input(s): DDIMER in the last 168 hours.  Radiology/Studies:  Dg Chest 2 View  Result Date: 01/29/2018 IMPRESSION: No acute cardiopulmonary process seen. Electronically Signed   By: Garald Balding M.D.   On: 01/29/2018 21:43    Assessment and Plan:   1.  Presyncope/fatigue/orthostatic hypotension/weight loss: -Likely multifactorial including the patient's month long history of profuse watery diarrhea in  the setting of recent C. difficile infection, poor p.o. intake, poor hydration, morbid obesity, physical deconditioning, anemia, and possible mild component of compensated heart failure -Recommend internal medicine address her noncardiac issues -Not currently receiving IV fluids -Recommend rechecking orthostatic vital signs with recommendation to replete as indicated  2.  Shortness of breath: -Patient with a prior history of unprovoked DVT x3 that was successfully treated with anticoagulation several years back -Check d-dimer which was elevated -She has noted some worsening orthopnea though EF on most recent echocardiogram is improved and she does not appear to be in acute decompensated heart failure -We will forward message to internal medicine that we recommend CTA chest to evaluate for PE -May benefit from VQ scan to evaluate for chronic PE as well, will defer this to internal medicine  3.  Chronic systolic CHF secondary to nonischemic cardiomyopathy/mild pulmonary hypertension: -She appears euvolemic on exam though this is somewhat difficult to assess secondary to body habitus -Echocardiogram during this admission actually shows a mildly improved LV systolic function with an EF of 45%, prior EF 25% in 02/2017 -Her evidence-based heart failure medications have been held since admission secondary to orthostasis -Would recommend rechecking orthostatic vital signs and if these are improved would slowly begin reinitiation of evidence-based heart failure therapy beginning with Toprol -  Echocardiogram this admission showed a mildly dilated RV -Would recommend CTA chest as above initially and if this is negative would recommend follow-up study with VQ scan to evaluate for chronic PE given her mild pulmonary hypertension and mildly dilated RV -Message has been sent to internal medicine clarifying these recommendations  4.  History of multiple DVTs: -It sounds like these may have been  unprovoked -Recommend hypercoagulable work-up, defer to internal medicine -D-dimer elevated -We strongly recommended further work-up with CTA chest versus VQ scan -Multiple messages have been sent to internal medicine, they will decide on the appropriate study -Patient has an allergy to contrast  5.  Nonobstructive CAD: -Ruled out -No symptoms concerning for angina -Echo as above -No plans for inpatient ischemic evaluation  6.  Diarrhea: -Per IM  7.  Hypokalemia: -Repleted  8.  Anemia: -Stable -Per IM  9.  Atypical chest pain/left shoulder pain: -Has ruled out as above -Recent left heart cath in 02/2017 showing no significant CAD -Symptoms appear atypical for angina -Consider MSK etiology -Defer to IM  10.  Morbid obesity with OSA and possible OHS: -Recommend CPAP while admitted  For questions or updates, please contact [facility] Please consult [email or web address] for contact info under Cardiology/STEMI.   Signed, Christell Faith, PA-C [place] Pager: [phone] 01/31/2018, 9:40 AM

## 2018-01-31 NOTE — Progress Notes (Signed)
Pt c/o cloudy urine.  Pt denies burning and pain with urination.

## 2018-01-31 NOTE — Consult Note (Signed)
Hematology/Oncology Consult note [facility] [phone] [phone]  Patient Care Team: [name], MD as PCP - General (Family Medicine) Corey Skains, MD as Consulting Physician (Cardiology)   Name of the patient: Brittany Nguyen  [id number]  September 07, 1965   Date of visit: 01/31/18 REASON FOR COSULTATION:  Empiric anticoagulation management.  History of presenting illness-  52 y.o. female with known history of chronic systolic CHF (EF 23%), AICD currently admitted for evaluation of 1 week histroy of lightheaded, near syncope, chest pain x 2 days, mid chest heaviness, intermittent, wax and wanes in severity. She has had history of chronic chest pain in the past. Cardiac cath 02/23/2017.  Chronic shortness of breath at baseline.  She was seen and evaluated by cardiology and felt that PE needs to be ruled out given her history of DVT x 3.  She has contrast allergy and VQ scan was obtained which showed low probability of PE. Note that she has had multiple VQ scan done in the past, 04/25/2017, 01/21/2017, 06/06/2016, 11/21/2015, 09/19/2015, for various reason including SOB, dypnea, chest pain. All V/Q scan have been negative. Per primary team, she also can not tolerate contrast study even after premedication.  HemOnc was consulted for evaluation of history of DVT and opinion of empiric anticoagulation.   Patient tells me that all her health care have been at [facility] that her cardiologist is at [facility].  She tells me that she has history of at least 2 episodes of DVT. First episode was in 2015, left lower extremity and she was not given oral "blood thinners" to go home.  Second episode was in 2017, left groin area. She recalls that she was given Lovenox injections and had taken it for about 6 months.  She tells me that due to her lower blood counts and intermitted blood in stool, Lovenox was discontinued.   Extensive medical records review was  performed. I reviewed her medical records  Within [facility] system since 2012 and records in care everywhere since 2016. She has had multiple lower extremity vascular US done sicne 2013 for evaluation of LE swelling and none of these [place] were positive for DVT.  As I mentioned above, she has had multiple V/Q scan done which are all negative.  She also had 4 chest CT w contrast done from 2010 to 2014 which were all negative for PE.  Her medical records shows from 2016 when she established with NP Charlestine Massed at [facility], that was the first time "DVT x 3" were mentioned in the chart.   However from the records in [facility] from 2012-2015, no DVT was recorded.  And these do not fit with what patient reported about having first episode of DVT in 2015 and second episode in 2017,which both were diagnosed at [facility].     Review of Systems  Constitutional: Positive for malaise/fatigue. Negative for chills, fever and weight loss.  HENT: Negative for congestion, ear discharge, ear pain, nosebleeds, sinus pain and sore throat.   Eyes: Negative for double vision, photophobia, pain, discharge and redness.  Respiratory: Positive for shortness of breath. Negative for cough, hemoptysis, sputum production and wheezing.   Cardiovascular: Positive for chest pain and orthopnea. Negative for palpitations, claudication and leg swelling.  Gastrointestinal: Negative for abdominal pain, blood in stool, constipation, diarrhea, heartburn, melena, nausea and vomiting.  Genitourinary: Negative for dysuria, flank pain, frequency and hematuria.  Musculoskeletal: Negative for back pain, myalgias and neck pain.  Skin: Negative  for itching and rash.  Neurological: Positive for weakness. Negative for dizziness, tingling, tremors, focal weakness and headaches.  Endo/Heme/Allergies: Negative for environmental allergies. Does not bruise/bleed easily.  Psychiatric/Behavioral: Negative for depression and hallucinations. The  patient is not nervous/anxious.     Allergies  Allergen Reactions  . Contrast Media [Iodinated Diagnostic Agents] Shortness Of Breath    Per CT report in 2014 pt had break through contrast reaction with hives and SOB following 13 hour prep. MSY   . Metrizamide Shortness Of Breath  . Penicillins Hives and Other (See Comments)    Other reaction(s): Other (See Comments) Has patient had a PCN reaction causing immediate rash, facial/tongue/throat swelling, SOB or lightheadedness with hypotension: Yes Has patient had a PCN reaction causing severe rash involving mucus membranes or skin necrosis: No Has patient had a PCN reaction that required hospitalization No Has patient had a PCN reaction occurring within the last 10 years: No If all of the above answers are "NO", then may proceed with Cephalosporin use. Has patient had a PCN reaction causing immediate rash, facial/tongue/throat swelling, SOB or lightheadedness with hypotension: Yes Has patient had a PCN reaction causing severe rash involving mucus membranes or skin necrosis: No Has patient had a PCN reaction that required hospitalization No Has patient had a PCN reaction occurring within the last 10 years: No If all of the above answers are "NO", then may proceed with Cephalosporin use.   . Isosorbide Nitrate     Other reaction(s): Headache  . Latex Hives  . Ondansetron Other (See Comments)    Severe headache  . Zofran [Ondansetron Hcl] Other (See Comments)    Causes migraines  . Povidone-Iodine Rash    Blistering rash  . Pulmicort [Budesonide] Itching    Patient Active Problem List   Diagnosis Date Noted  . C. difficile colitis 01/14/2018  . GIB (gastrointestinal bleeding) 01/08/2018  . SOB (shortness of breath) 11/01/2017  . Influenza A 10/29/2017  . Acute respiratory failure ([place]) 09/24/2017  . Sepsis ([place]) 07/03/2017  . COPD with acute exacerbation ([place]) 04/26/2017  . Community acquired pneumonia 04/25/2017  . Chronic  systolic heart failure ([place]) 02/03/2017  . Hypotension 02/03/2017  . Chest pain 12/09/2016  . RLS (restless legs syndrome) 12/09/2016  . Cough, persistent 11/22/2015  . Hypokalemia 11/22/2015  . Leukocytosis 11/22/2015  . Dyspnea 11/21/2015  . Respiratory distress 09/19/2015  . Breast pain 01/11/2013  . Hx MRSA infection   . Lump or mass in breast   . GOITER, MULTINODULAR 01/20/2009  . HYPOGLYCEMIA, UNSPECIFIED 01/20/2009  . GERD 01/20/2009  . DIVERTICULITIS OF COLON 01/20/2009     Past Medical History:  Diagnosis Date  . Asthma 2013  . CHF (congestive heart failure) ([place])   . Diverticulitis 2010  . DVT (deep venous thrombosis) ([place])   . Endometriosis 1990  . Heart disease 2013  . Hx MRSA infection   . Lump or mass in breast   . Restless leg      Past Surgical History:  Procedure Laterality Date  . ABDOMINAL HYSTERECTOMY     age 31  . CARDIAC DEFIBRILLATOR PLACEMENT    . CESAREAN SECTION    . CHOLECYSTECTOMY    . COLECTOMY    . NASAL SINUS SURGERY  2012    Social History   Socioeconomic History  . Marital status: Married    Spouse name: Not on file  . Number of children: Not on file  . Years of education: Not on file  .  Highest education level: Not on file  Occupational History  . Occupation: works at [employer].  Social Needs  . Financial resource strain: Not on file  . Food insecurity:    Worry: Not on file    Inability: Not on file  . Transportation needs:    Medical: Not on file    Non-medical: Not on file  Tobacco Use  . Smoking status: Never Smoker  . Smokeless tobacco: Never Used  Substance and Sexual Activity  . Alcohol use: No  . Drug use: No  . Sexual activity: Yes    Birth control/protection: Post-menopausal  Lifestyle  . Physical activity:    Days per week: Not on file    Minutes per session: Not on file  . Stress: Not on file  Relationships  . Social connections:    Talks on phone: Not on file    Gets together: Not on file     Attends religious service: Not on file    Active member of club or organization: Not on file    Attends meetings of clubs or organizations: Not on file    Relationship status: Not on file  . Intimate partner violence:    Fear of current or ex partner: Not on file    Emotionally abused: Not on file    Physically abused: Not on file    Forced sexual activity: Not on file  Other Topics Concern  . Not on file  Social History Narrative  . Not on file     Family History  Problem Relation Age of Onset  . Cancer Mother 30       ovarian  . CAD Mother   . Cancer Father 9       brain  . CAD Father   . Cancer Daughter 18       skin  . Cancer Maternal Aunt 34       breast     Current Facility-Administered Medications:  .  acetaminophen (TYLENOL) tablet 650 mg, 650 mg, Oral, Q4H PRN, Jodell Cipro, Prasanna, MD .  albuterol (PROVENTIL) (2.5 MG/3ML) 0.083% nebulizer solution 3 mL, 3 mL, Nebulization, Q6H PRN, Jodell Cipro, Prasanna, MD .  aspirin EC tablet 81 mg, 81 mg, Oral, Daily, Jodell Cipro, Prasanna, MD, 81 mg at 01/31/18 0844 .  bisacodyl (DULCOLAX) EC tablet 5 mg, 5 mg, Oral, Daily PRN, Jodell Cipro, Prasanna, MD .  budesonide (PULMICORT) nebulizer solution 0.25 mg, 0.25 mg, Nebulization, BID, Jodell Cipro, Prasanna, MD, 0.25 mg at 01/31/18 0735 .  dicyclomine (BENTYL) capsule 10 mg, 10 mg, Oral, TID PRN, Arta Silence, MD .  diphenhydrAMINE (BENADRYL) 25 mg in sodium chloride 0.9 % 50 mL IVPB, 25 mg, Intravenous, Q4H PRN, Max Sane, MD, Last Rate: 100 mL/hr at 01/31/18 1741, 25 mg at 01/31/18 1741 .  enoxaparin (LOVENOX) injection 40 mg, 40 mg, Subcutaneous, Q24H, Sridharan, Prasanna, MD, 40 mg at 01/30/18 2256 .  ferrous sulfate tablet 325 mg, 325 mg, Oral, Q breakfast, Jodell Cipro, Prasanna, MD, 325 mg at 01/31/18 0844 .  metoprolol succinate (TOPROL-XL) 24 hr tablet 12.5 mg, 12.5 mg, Oral, Daily, End, Christopher, MD .  morphine 2 MG/ML injection 2 mg, 2 mg, Intravenous, Q3H PRN, 2 mg at  01/31/18 1927 **OR** morphine 4 MG/ML injection 4 mg, 4 mg, Intravenous, Q3H PRN, Arta Silence, MD, 4 mg at 01/31/18 1520 .  multivitamin with minerals tablet 1 tablet, 1 tablet, Oral, Daily, Arta Silence, MD, 1 tablet at 01/31/18 0844 .  nitroGLYCERIN (NITROSTAT) SL tablet 0.4 mg, 0.4  mg, Sublingual, Q5 min PRN, Jodell Cipro, Prasanna, MD .  pantoprazole (PROTONIX) EC tablet 40 mg, 40 mg, Oral, BID, Jodell Cipro, Prasanna, MD, 40 mg at 01/31/18 0844 .  promethazine (PHENERGAN) injection 12.5-25 mg, 12.5-25 mg, Intravenous, Q6H PRN, Arta Silence, MD, 25 mg at 01/31/18 1927 .  rOPINIRole (REQUIP) tablet 4 mg, 4 mg, Oral, QHS, Sridharan, Prasanna, MD, 4 mg at 01/30/18 2255 .  senna-docusate (Senokot-S) tablet 1 tablet, 1 tablet, Oral, QHS PRN, Jodell Cipro, Prasanna, MD .  sertraline (ZOLOFT) tablet 50 mg, 50 mg, Oral, Daily, Jodell Cipro, Prasanna, MD, 50 mg at 01/31/18 0106 .  sodium chloride flush (NS) 0.9 % injection 3 mL, 3 mL, Intravenous, Q12H, Sridharan, Prasanna, MD, 3 mL at 01/31/18 1000 .  traMADol (ULTRAM) tablet 50 mg, 50 mg, Oral, Q6H PRN, Jodell Cipro, Prasanna, MD .  vitamin B-12 (CYANOCOBALAMIN) tablet 500 mcg, 500 mcg, Oral, Daily, Arta Silence, MD, 500 mcg at 01/31/18 0844   Physical exam: ECOG 3 Vitals:   01/31/18 0736 01/31/18 0843 01/31/18 1204 01/31/18 1951  BP:  104/65 106/81 (!) 113/57  Pulse:  94 (!) 101 (!) 102  Resp:  18 18 16   Temp:    98.7 F (37.1 C)  TempSrc:    Oral  SpO2: 96% 95% 95% 97%  Weight:      Height:       Physical Exam  Constitutional: She is oriented to person, place, and time and well-developed, well-nourished, and in no distress. No distress.  HENT:  Head: Normocephalic and atraumatic.  Nose: Nose normal.  Mouth/Throat: Oropharynx is clear and moist. No oropharyngeal exudate.  Eyes: Pupils are equal, round, and reactive to light. EOM are normal. Left eye exhibits no discharge. No scleral icterus.  Neck: Normal range of motion.  Neck supple.  Cardiovascular: Regular rhythm and normal heart sounds.  tachycardia  Pulmonary/Chest: Effort normal. No respiratory distress. She has no wheezes.  Abdominal: Soft. She exhibits no distension and no mass. There is no tenderness. There is no rebound.  Musculoskeletal: Normal range of motion. She exhibits no edema or tenderness.  Neurological: She is alert and oriented to person, place, and time. No cranial nerve deficit. She exhibits normal muscle tone. Coordination normal.  Skin: Skin is warm and dry. No rash noted. She is not diaphoretic. No erythema.  Psychiatric: Affect and judgment normal.        CMP Latest Ref Rng & Units 01/31/2018  Glucose 65 - 99 mg/dL 108(H)  BUN 6 - 20 mg/dL 11  Creatinine 0.44 - 1.00 mg/dL 0.80  Sodium 135 - 145 mmol/L 139  Potassium 3.5 - 5.1 mmol/L 4.2  Chloride 101 - 111 mmol/L 102  CO2 22 - 32 mmol/L 27  Calcium 8.9 - 10.3 mg/dL 9.2  Total Protein 6.5 - 8.1 g/dL -  Total Bilirubin 0.3 - 1.2 mg/dL -  Alkaline Phos 38 - 126 U/L -  AST 15 - 41 U/L -  ALT 14 - 54 U/L -   CBC Latest Ref Rng & Units 01/31/2018  WBC 3.6 - 11.0 K/uL 9.6  Hemoglobin 12.0 - 16.0 g/dL 11.4(L)  Hematocrit 35.0 - 47.0 % 33.5(L)  Platelets 150 - 440 K/uL 263      Ct Abdomen Pelvis Wo Contrast  Result Date: 01/14/2018 CLINICAL DATA:  52 year old female with acute abdominal and pelvic pain and nausea. EXAM: CT ABDOMEN AND PELVIS WITHOUT CONTRAST TECHNIQUE: Multidetector CT imaging of the abdomen and pelvis was performed following the standard protocol without IV contrast. COMPARISON:  01/09/2018  and prior CTs FINDINGS: Please note that parenchymal abnormalities may be missed without intravenous contrast. Lower chest: No acute abnormality. Cardiac defibrillator lead is identified. Hepatobiliary: Mild hepatic steatosis noted. The patient is status post cholecystectomy. No biliary dilatation. Pancreas: Unremarkable. Spleen: Unremarkable Adrenals/Urinary Tract: The  kidneys, adrenal glands and bladder are unremarkable. Stomach/Bowel: The stomach is unchanged with a stable 2.4 cm fatty lesion within the wall of the gastric greater curvature. No evidence of new bowel wall thickening, distention, or inflammatory changes. Distal colonic surgical changes again identified. Vascular/Lymphatic: No significant vascular findings are present. No enlarged abdominal or pelvic lymph nodes. Reproductive: Status post hysterectomy. No adnexal masses. Other: No ascites, focal collection or pneumoperitoneum. A small stable infraumbilical midline ventral hernia within the UPPER pelvis containing only fat is again noted. Musculoskeletal: No acute or suspicious bony abnormalities. IMPRESSION: 1. No evidence of acute abnormality. No findings to suggest a cause for this patient's abdominal pain. 2. Mild hepatic steatosis. Electronically Signed   By: Margarette Canada M.D.   On: 01/14/2018 14:25   Ct Abdomen Pelvis Wo Contrast  Result Date: 01/09/2018 CLINICAL DATA:  52 year old female with history of diverticulitis and endometriosis presenting with lower abdominal cramping and diarrhea with dark and bright red blood ongoing for the past 2 days. Followed for recurrent C difficile colitis. Stool positive for C difficile despite vancomycin treatment. Prior cholecystectomy, hysterectomy and colectomy. Initial encounter. EXAM: CT ABDOMEN AND PELVIS WITHOUT CONTRAST TECHNIQUE: Multidetector CT imaging of the abdomen and pelvis was performed following the standard protocol without IV contrast. COMPARISON:  02/01/2016 CT. FINDINGS: Lower chest: Stable left base 2.5 mm nodule. AICD leads in the region of the right atrium and right ventricle. Heart size top-normal. Hepatobiliary: Mild fatty infiltration of liver. Taking into account limitation by non contrast imaging, no focal worrisome hepatic lesion. Post cholecystectomy. No calcified common bile duct stone. Pancreas: Taking into account limitation by non  contrast imaging, no worrisome pancreatic mass or infiltration. Spleen: Top normal size spleen. Taking into account limitation by non contrast imaging, no focal splenic lesion. Adrenals/Urinary Tract: No obstructing stone or hydronephrosis. Taking into account limitation by non contrast imaging, no worrisome renal adrenal or bladder lesion. Stomach/Bowel: Post sigmoid resection. No colonic inflammatory process identified. Scattered diverticula. Appendix not visualized and may been surgically removed. No small bowel abnormality noted. 2.4 cm fatty lesion suspected within the anterior inferior wall of the gastric body/antrum junction (series 2, image 33, series 6, image 77 and series 5, image 27). Vascular/Lymphatic: No aortic aneurysm.  No adenopathy. Reproductive: Post hysterectomy.  No adnexal mass noted. Other: Small fat containing hernia infraumbilical region with rent measuring 1.1 x 0.9 cm and herniated fat measuring up to 3.2 cm. No free intraperitoneal air. Musculoskeletal: No worrisome osseous abnormality. Mild degenerative changes most notable L5-S1. IMPRESSION: Post sigmoid resection. No colonic inflammatory process identified. Scattered diverticula. 2.4 cm fatty lesion suspected within the anterior inferior wall of the gastric body/antrum junction (series 2, image 33, series 6, image 77 and series 5, image 27). Post hysterectomy and cholecystectomy. Small fat containing hernia infraumbilical region. AICD in place. Mild fatty infiltration liver. Electronically Signed   By: Genia Del M.D.   On: 01/09/2018 11:01   Dg Chest 2 View  Result Date: 01/29/2018 CLINICAL DATA:  Acute onset of left-sided chest pain, radiating to the left side of the neck. Left arm numbness. Dizziness. EXAM: CHEST - 2 VIEW COMPARISON:  Chest radiograph performed 01/27/2018 FINDINGS: The lungs are well-aerated and clear. There  is no evidence of focal opacification, pleural effusion or pneumothorax. The heart is borderline  normal in size. A pacemaker/AICD is noted at the left chest wall, with leads ending at the right atrium and right ventricle. No acute osseous abnormalities are seen. Clips are noted within the right upper quadrant, reflecting prior cholecystectomy. IMPRESSION: No acute cardiopulmonary process seen. Electronically Signed   By: Garald Balding M.D.   On: 01/29/2018 21:43   Dg Chest 2 View  Result Date: 01/27/2018 CLINICAL DATA:  Chest pain EXAM: CHEST - 2 VIEW COMPARISON:  10/29/2017 FINDINGS: There is a leftchest wall AICDwith leads projecting within the right atrium and right ventricle. The heart size and mediastinal contours are within normal limits. Both lungs are clear. The visualized skeletal structures are unremarkable. IMPRESSION: No active cardiopulmonary disease. Electronically Signed   By: Ulyses Jarred M.D.   On: 01/27/2018 01:51   Dg Abdomen 1 View  Result Date: 01/14/2018 CLINICAL DATA:  C difficile colitis, persistent diarrhea, syncopal episode this morning, lower abdominal pain and nausea EXAM: ABDOMEN - 1 VIEW COMPARISON:  CT abdomen and pelvis 01/09/2018 FINDINGS: Food debris in stomach. Surgical clips RIGHT upper quadrant consistent with cholecystectomy. Retained contrast within a diverticulum at the ascending colon. Normal bowel gas pattern without bowel dilatation or definite bowel wall thickening. Bones demineralized. Numerous pelvic phleboliths. IMPRESSION: Unremarkable abdominal radiograph. Electronically Signed   By: Lavonia Dana M.D.   On: 01/14/2018 12:22   Nm Pulmonary Perf And Vent  Result Date: 01/31/2018 CLINICAL DATA:  Short of breath for 3 weeks EXAM: NUCLEAR MEDICINE VENTILATION - PERFUSION LUNG SCAN TECHNIQUE: Ventilation images were obtained in multiple projections using inhaled aerosol Tc-60m DTPA. Perfusion images were obtained in multiple projections after intravenous injection of Tc-53m-MAA. RADIOPHARMACEUTICALS:  30.9 mCi of Tc-50m DTPA aerosol inhalation and 4.2 mCi  Tc58m-MAA IV COMPARISON:  None. FINDINGS: Lateral imaging could not be performed limiting the exam. Ventilation: There is a nonsegmental defect in the lateral left mid lung zone secondary to the pacemaker device. There are no true segmental or subsegmental defects. Perfusion: There is a nonsegmental perfusion defect in the left mid lung zone laterally related to the pacemaker device. There are no true segmental or subsegmental defects. IMPRESSION: Low probability for pulmonary thromboembolism. Electronically Signed   By: Marybelle Killings M.D.   On: 01/31/2018 15:19   Dg Chest Port 1 View  Result Date: 01/31/2018 CLINICAL DATA:  Productive cough. EXAM: PORTABLE CHEST 1 VIEW COMPARISON:  Radiographs of Jan 29, 2018. FINDINGS: Stable cardiomediastinal silhouette. No pneumothorax or pleural effusion is noted. Left-sided pacemaker is unchanged in position. Both lungs are clear. The visualized skeletal structures are unremarkable. IMPRESSION: No acute cardiopulmonary abnormality seen. Electronically Signed   By: Marijo Conception, M.D.   On: 01/31/2018 16:01    Assessment and plan- Patient is a 52 y.o. female with extensive heart disease including MI, systolic CHF, cardiomyopathy, currently admitted for evaluation of lightheaded, near syncope, and chest pain.   # Questionable history of "DVT x3", not currently on any anticoagulant.  Extensive medical records reviewed and I did not find any [place] evidence of lower extremity DVT, or patient being on anticoagulation,. She may have been given Lovenox prophylactic dose for DVT prophylaxis during her multiple hospital admissions. It was not clear whether she was taking Lovenox injections for 6 months and not clear what dose she was on.  # Intermittent " blood in stool", unclear if she has had any GI work up to look  for bleeding resource.   # Chest pain: patient admits that she has always had intermittent chest pain. Medical records shows multiple admission for chest pain  and SOB evaluation.   Given that her history of " 3 episodes of blood clots" were questionable, and currently she has no image evidence of PE or DVT, acute on chronic chest pain, unclear history of possible history of GI bleeding, I do not recommend empiric anticoagulation for PE, unless she has other medical records that are not available to [place] currently.  Defer cardiology for determining necessity for anticoagulation for cardiac reasons.   Discussed with patient and she voices understanding. She also mentioned that she is not interested in taking anticoagulants unless she "really has to".   Recommend lovenox 40mg  daily for DVT prophylaxis.   Dr.Shah, thank you for allowing me to participate in the care of this patient.  Total face to face encounter time for this patient visit was 70 min. >50% of the time was  spent in counseling and coordination of care.    Earlie Server, MD, PhD Hematology Oncology [facility] Pager- [phone] 01/31/2018

## 2018-01-31 NOTE — Progress Notes (Signed)
Pt c/o L decreased movement in L arm along with pain.  On-call MD paged.

## 2018-02-01 LAB — CBC
HEMATOCRIT: 32.2 % — AB (ref 35.0–47.0)
HEMOGLOBIN: 11 g/dL — AB (ref 12.0–16.0)
MCH: 27.6 pg (ref 26.0–34.0)
MCHC: 34.2 g/dL (ref 32.0–36.0)
MCV: 80.7 fL (ref 80.0–100.0)
Platelets: 229 10*3/uL (ref 150–440)
RBC: 3.99 MIL/uL (ref 3.80–5.20)
RDW: 17.6 % — ABNORMAL HIGH (ref 11.5–14.5)
WBC: 7.9 10*3/uL (ref 3.6–11.0)

## 2018-02-01 LAB — BASIC METABOLIC PANEL
Anion gap: 9 (ref 5–15)
BUN: 10 mg/dL (ref 6–20)
CHLORIDE: 101 mmol/L (ref 101–111)
CO2: 28 mmol/L (ref 22–32)
CREATININE: 0.82 mg/dL (ref 0.44–1.00)
Calcium: 8.9 mg/dL (ref 8.9–10.3)
GFR calc Af Amer: >60 mL/min (ref >60)
GFR calc non Af Amer: >60 mL/min (ref >60)
Glucose, Bld: 112 mg/dL — ABNORMAL HIGH (ref 65–99)
POTASSIUM: 3.7 mmol/L (ref 3.5–5.1)
Sodium: 138 mmol/L (ref 135–145)

## 2018-02-01 LAB — GLUCOSE, CAPILLARY
GLUCOSE-CAPILLARY: 139 mg/dL — AB (ref 65–99)
Glucose-Capillary: 117 mg/dL — ABNORMAL HIGH (ref 65–99)

## 2018-02-01 LAB — URINALYSIS, ROUTINE W REFLEX MICROSCOPIC
BACTERIA UA: NONE SEEN
BILIRUBIN URINE: NEGATIVE
GLUCOSE, UA: NEGATIVE mg/dL
HGB URINE DIPSTICK: NEGATIVE
KETONES UR: NEGATIVE mg/dL
NITRITE: NEGATIVE
PROTEIN: NEGATIVE mg/dL
Specific Gravity, Urine: 1.012 (ref 1.005–1.030)
pH: 5 (ref 5.0–8.0)

## 2018-02-01 MED ORDER — PANTOPRAZOLE SODIUM 40 MG PO TBEC: 40.0000 mg | DELAYED_RELEASE_TABLET | Freq: Every day | ORAL | 1 refills | Status: DC

## 2018-02-01 MED ORDER — TRAMADOL HCL 50 MG PO TABS: 50.0000 mg | ORAL_TABLET | Freq: Four times a day (QID) | ORAL | 0 refills | Status: DC | PRN

## 2018-02-01 NOTE — Progress Notes (Signed)
Pt complaining of left eye swollen, red and draining, requesting some eye drops. MD paged, Dr. Jannifer Franklin to put orders in for eye drops.Will continue to monitor.  Conley Simmonds, RN, BSN

## 2018-02-01 NOTE — Plan of Care (Signed)
  Problem: Clinical Measurements: Goal: Will remain free from infection Outcome: Progressing Goal: Diagnostic test results will improve Outcome: Progressing Goal: Respiratory complications will improve Outcome: Progressing   Problem: Activity: Goal: Risk for activity intolerance will decrease Outcome: Progressing   Problem: Coping: Goal: Level of anxiety will decrease Outcome: Progressing   Problem: Pain Managment: Goal: General experience of comfort will improve Outcome: Progressing   Problem: Safety: Goal: Ability to remain free from injury will improve Outcome: Progressing   Problem: Skin Integrity: Goal: Risk for impaired skin integrity will decrease Outcome: Progressing

## 2018-02-01 NOTE — Discharge Summary (Signed)
[place] [facility] [place] NAME: Brittany Nguyen    MR#:  [id number]  DATE OF BIRTH:  1966-05-10  DATE OF ADMISSION:  01/29/2018 ADMITTING PHYSICIAN: Arta Silence, MD  DATE OF DISCHARGE: 02/01/2018  1:03 PM  PRIMARY CARE PHYSICIAN: [name], MD   ADMISSION DIAGNOSIS:  Unstable angina ([place]) [I20.0] Chest pain, unspecified type [R07.9]  DISCHARGE DIAGNOSIS:  Active Problems:   Chest pain   Unstable angina ([place])   Encounter for anticoagulation discussion and counseling Elevated Ddimer Chronic Diastolic Heart Failure Non Ischemic Cardiomyopathy SECONDARY DIAGNOSIS:   Past Medical History:  Diagnosis Date  . Asthma 2013  . CHF (congestive heart failure) ([place])   . Diverticulitis 2010  . DVT (deep venous thrombosis) ([place])   . Endometriosis 1990  . Heart disease 2013  . Hx MRSA infection   . Lump or mass in breast   . Restless leg      ADMITTING HISTORY Brittany Nguyen  is a 52 y.o. female with a known history of chronic systolic CHF/NICM (EF 37-62%), AICD/PPM (Medtronic Evera), remote heart failure monitoring (Medtronic Optivol), p/w CP and LH/near-syncope. Followed by [facility]. Had a cardiac cath 02/23/2017, "normal coronary arteriograms". Recent admit 05/12-05/14. Also had recent C. Diff, s/p Fidaxomicin x10d (05/09-05/19). Now p/w c/o 1wk Hx LH/near-syncope, positional, occurs predominantly when standing up from sitting/lying position. Endorses 2d Hx CP, characterized as midchest heaviness/pressure radiating to back, neck, L shoulder, L arm. Pain has been constant, but waxes/wanes in severity. Pain is sometimes sharp. Pain is exertional, but non-pleuritic, non-positional and non-reproducible w/ chest wall palpation. Endorses thirst/dry mouth/dehydration, diaphoresis, fatigue/malaise/generalized weakness, decreased exercise tolerance. Denies SOB, palpitations, AP, N/V/D, cough, hemoptysis, wheezing, night sweats, rigors, LOC, urinary  symptoms.     HOSPITAL COURSE:  Patient admitted to telemetry. Patient seen by [facility] cardiology.Serial troponins negative. BNP during work up is 26.Patient had hypokalemia which was corrected during hospitalization. Echocardiogram : Left ventricle: The cavity size was mildly dilated. Systolic   function was mildly reduced. The estimated ejection fraction was   45%. Doppler parameters are consistent with abnormal left   ventricular relaxation (grade 1 diastolic dysfunction). - Aortic valve: Valve area (VTI): 1.6 cm^2. Valve area (Vmax): 1.51   cm^2. Valve area (Vmean): 1.45 cm^2. - Mitral valve: Calcified annulus. Valve area by continuity   equation (using LVOT flow): 1.23 cm^2. - Left atrium: The atrium was mildly dilated. - Right ventricle: The cavity size was mildly dilated.  Her ddimer was elevated. Patient was worked up with VQ scan as she was allergic to contrast dye. Low probability for pulmonary embolism.Patient had a cardiac catheterization with in last year which revealed minimal CAD. No further ischemia work up recommended by Cardiology. Her chest pain resolved completely. As patient gave history of 3 episodes of dvt in the past and not on anticoagulation, hematology was consulted for advise. Extensive history and records were reviewed by hematology and found no evidence of any DVT in the past and old records. Anticoagulation was deferred by them. Patient will be discharged home and follow up with [facility] cardiology as out patient.   CONSULTS OBTAINED:  Treatment Team:  Arta Silence, MD Wellington Hampshire, MD Earlie Server, MD  DRUG ALLERGIES:   Allergies  Allergen Reactions  . Contrast Media [Iodinated Diagnostic Agents] Shortness Of Breath    Per CT report in 2014 pt had break through contrast reaction with hives and SOB following 13 hour prep. MSY   . Metrizamide  Shortness Of Breath  . Penicillins Hives and Other (See Comments)    Other reaction(s): Other (See  Comments) Has patient had a PCN reaction causing immediate rash, facial/tongue/throat swelling, SOB or lightheadedness with hypotension: Yes Has patient had a PCN reaction causing severe rash involving mucus membranes or skin necrosis: No Has patient had a PCN reaction that required hospitalization No Has patient had a PCN reaction occurring within the last 10 years: No If all of the above answers are "NO", then may proceed with Cephalosporin use. Has patient had a PCN reaction causing immediate rash, facial/tongue/throat swelling, SOB or lightheadedness with hypotension: Yes Has patient had a PCN reaction causing severe rash involving mucus membranes or skin necrosis: No Has patient had a PCN reaction that required hospitalization No Has patient had a PCN reaction occurring within the last 10 years: No If all of the above answers are "NO", then may proceed with Cephalosporin use.   . Isosorbide Nitrate     Other reaction(s): Headache  . Latex Hives  . Ondansetron Other (See Comments)    Severe headache  . Zofran [Ondansetron Hcl] Other (See Comments)    Causes migraines  . Povidone-Iodine Rash    Blistering rash  . Pulmicort [Budesonide] Itching    DISCHARGE MEDICATIONS:   Allergies as of 02/01/2018      Reactions   Contrast Media [iodinated Diagnostic Agents] Shortness Of Breath   Per CT report in 2014 pt had break through contrast reaction with hives and SOB following 13 hour prep. MSY    Metrizamide Shortness Of Breath   Penicillins Hives, Other (See Comments)   Other reaction(s): Other (See Comments) Has patient had a PCN reaction causing immediate rash, facial/tongue/throat swelling, SOB or lightheadedness with hypotension: Yes Has patient had a PCN reaction causing severe rash involving mucus membranes or skin necrosis: No Has patient had a PCN reaction that required hospitalization No Has patient had a PCN reaction occurring within the last 10 years: No If all of the above  answers are "NO", then may proceed with Cephalosporin use. Has patient had a PCN reaction causing immediate rash, facial/tongue/throat swelling, SOB or lightheadedness with hypotension: Yes Has patient had a PCN reaction causing severe rash involving mucus membranes or skin necrosis: No Has patient had a PCN reaction that required hospitalization No Has patient had a PCN reaction occurring within the last 10 years: No If all of the above answers are "NO", then may proceed with Cephalosporin use.   Isosorbide Nitrate    Other reaction(s): Headache   Latex Hives   Ondansetron Other (See Comments)   Severe headache   Zofran [ondansetron Hcl] Other (See Comments)   Causes migraines   Povidone-iodine Rash   Blistering rash   Pulmicort [budesonide] Itching      Medication List    TAKE these medications   acetaminophen 325 MG tablet Commonly known as:  TYLENOL Take 2 tablets (650 mg total) by mouth every 6 (six) hours as needed for mild pain (or Fever >/= 101).   albuterol 1.25 MG/3ML nebulizer solution Commonly known as:  ACCUNEB Take 3 mLs by nebulization every 6 (six) hours as needed for wheezing.   beclomethasone 80 MCG/ACT inhaler Commonly known as:  QVAR Inhale 2 puffs into the lungs 2 (two) times daily.   dicyclomine 10 MG capsule Commonly known as:  BENTYL Take 10 mg by mouth 3 (three) times daily as needed (abdominal pain).   ferrous sulfate 325 (65 FE) MG tablet Take 325  mg by mouth daily with breakfast.   losartan 25 MG tablet Commonly known as:  COZAAR Take 12.5 mg by mouth 2 (two) times daily.   metoprolol succinate 100 MG 24 hr tablet Commonly known as:  TOPROL-XL Take 1 tablet (100 mg total) by mouth daily. Take with or immediately following a meal.   multivitamin with minerals Tabs tablet Take 1 tablet by mouth daily.   nitroGLYCERIN 0.4 MG SL tablet Commonly known as:  NITROSTAT Place 1 tablet (0.4 mg total) under the tongue every 5 (five) minutes as  needed for chest pain.   pantoprazole 40 MG tablet Commonly known as:  PROTONIX Take 1 tablet (40 mg total) by mouth daily. What changed:  when to take this   prochlorperazine 10 MG tablet Commonly known as:  COMPAZINE Take 1 tablet (10 mg total) by mouth every 6 (six) hours as needed for nausea or vomiting.   rOPINIRole 2 MG tablet Commonly known as:  REQUIP Take 2 tablets (4 mg total) by mouth at bedtime. 2-4  Mg per night   sertraline 50 MG tablet Commonly known as:  ZOLOFT Take 50 mg by mouth daily.   spironolactone 50 MG tablet Commonly known as:  ALDACTONE Take 50 mg by mouth daily.   traMADol 50 MG tablet Commonly known as:  ULTRAM Take 1 tablet (50 mg total) by mouth every 6 (six) hours as needed for moderate pain or severe pain. What changed:  reasons to take this   vitamin B-12 500 MCG tablet Commonly known as:  CYANOCOBALAMIN Take 500 mcg by mouth daily.       Today  Patient seen and evaluated today No chest pain No shortness of breath  VITAL SIGNS:  Blood pressure 112/84, pulse 100, temperature 98.2 F (36.8 C), temperature source Oral, resp. rate 18, height 5\' 5"  (1.651 m), weight 101.3 kg (223 lb 6.4 oz), last menstrual period 01/22/2012, SpO2 95 %.  I/O:    Intake/Output Summary (Last 24 hours) at 02/01/2018 1653 Last data filed at 02/01/2018 0700 Gross per 24 hour  Intake 3 ml  Output 800 ml  Net -797 ml    PHYSICAL EXAMINATION:  Physical Exam  GENERAL:  52 y.o.-year-old patient lying in the bed with no acute distress.  LUNGS: Normal breath sounds bilaterally, no wheezing, rales,rhonchi or crepitation. No use of accessory muscles of respiration.  CARDIOVASCULAR: S1, S2 normal. No murmurs, rubs, or gallops.  ABDOMEN: Soft, non-tender, non-distended. Bowel sounds present. No organomegaly or mass.  NEUROLOGIC: Moves all 4 extremities. PSYCHIATRIC: The patient is alert and oriented x 3.  SKIN: No obvious rash, lesion, or ulcer.   DATA  REVIEW:   CBC Recent Labs  Lab 02/01/18 0330  WBC 7.9  HGB 11.0*  HCT 32.2*  PLT 229    Chemistries  Recent Labs  Lab 01/27/18 0108  01/30/18 0253  02/01/18 0330  NA 138   < >  --    < > 138  K 3.0*   < >  --    < > 3.7  CL 98*   < >  --    < > 101  CO2 30   < >  --    < > 28  GLUCOSE 128*   < >  --    < > 112*  BUN 17   < >  --    < > 10  CREATININE 1.03*   < >  --    < > 0.82  CALCIUM 9.6   < >  --    < > 8.9  MG  --   --  2.0  --   --   AST 28  --   --   --   --   ALT 23  --   --   --   --   ALKPHOS 112  --   --   --   --   BILITOT 0.3  --   --   --   --    < > = values in this interval not displayed.    Cardiac Enzymes Recent Labs  Lab 01/30/18 0509  TROPONINI <0.03    Microbiology Results  Results for orders placed or performed during the hospital encounter of 01/14/18  C difficile quick scan w PCR reflex     Status: None   Collection Time: 01/14/18 11:32 AM  Result Value Ref Range Status   C Diff antigen NEGATIVE NEGATIVE Final   C Diff toxin NEGATIVE NEGATIVE Final   C Diff interpretation No C. difficile detected.  Final    Comment: Performed at [facility], [address] [place], [place] [address]  Gastrointestinal Panel by PCR , Stool     Status: Abnormal   Collection Time: 01/14/18 11:32 AM  Result Value Ref Range Status   Campylobacter species NOT DETECTED NOT DETECTED Final   Plesimonas shigelloides NOT DETECTED NOT DETECTED Final   Salmonella species NOT DETECTED NOT DETECTED Final   Yersinia enterocolitica NOT DETECTED NOT DETECTED Final   Vibrio species NOT DETECTED NOT DETECTED Final   Vibrio cholerae NOT DETECTED NOT DETECTED Final   Enteroaggregative E coli (EAEC) NOT DETECTED NOT DETECTED Final   Enteropathogenic E coli (EPEC) NOT DETECTED NOT DETECTED Final   Enterotoxigenic E coli (ETEC) NOT DETECTED NOT DETECTED Final   Shiga like toxin producing E coli (STEC) NOT DETECTED NOT DETECTED Final   Shigella/Enteroinvasive E  coli (EIEC) NOT DETECTED NOT DETECTED Final   Cryptosporidium NOT DETECTED NOT DETECTED Final   Cyclospora cayetanensis NOT DETECTED NOT DETECTED Final   Entamoeba histolytica NOT DETECTED NOT DETECTED Final   Giardia lamblia NOT DETECTED NOT DETECTED Final   Adenovirus F40/41 NOT DETECTED NOT DETECTED Final   Astrovirus NOT DETECTED NOT DETECTED Final   Norovirus GI/GII DETECTED (A) NOT DETECTED Final    Comment: RESULT CALLED TO, READ BACK BY AND VERIFIED WITH: BRANDY DAVIS @1647  01/14/18 AKT    Rotavirus A NOT DETECTED NOT DETECTED Final   Sapovirus (I, II, IV, and V) NOT DETECTED NOT DETECTED Final    Comment: Performed at [facility], [address] [place],  [address]    RADIOLOGY:  Nm Pulmonary Perf And Vent  Result Date: 01/31/2018 CLINICAL DATA:  Short of breath for 3 weeks EXAM: NUCLEAR MEDICINE VENTILATION - PERFUSION LUNG SCAN TECHNIQUE: Ventilation images were obtained in multiple projections using inhaled aerosol Tc-93m DTPA. Perfusion images were obtained in multiple projections after intravenous injection of Tc-67m-MAA. RADIOPHARMACEUTICALS:  30.9 mCi of Tc-40m DTPA aerosol inhalation and 4.2 mCi Tc54m-MAA IV COMPARISON:  None. FINDINGS: Lateral imaging could not be performed limiting the exam. Ventilation: There is a nonsegmental defect in the lateral left mid lung zone secondary to the pacemaker device. There are no true segmental or subsegmental defects. Perfusion: There is a nonsegmental perfusion defect in the left mid lung zone laterally related to the pacemaker device. There are no true segmental or subsegmental defects. IMPRESSION: Low probability for pulmonary  thromboembolism. Electronically Signed   By: Marybelle Killings M.D.   On: 01/31/2018 15:19   Dg Chest Port 1 View  Result Date: 01/31/2018 CLINICAL DATA:  Productive cough. EXAM: PORTABLE CHEST 1 VIEW COMPARISON:  Radiographs of Jan 29, 2018. FINDINGS: Stable cardiomediastinal silhouette. No  pneumothorax or pleural effusion is noted. Left-sided pacemaker is unchanged in position. Both lungs are clear. The visualized skeletal structures are unremarkable. IMPRESSION: No acute cardiopulmonary abnormality seen. Electronically Signed   By: Marijo Conception, M.D.   On: 01/31/2018 16:01    Follow up with PCP in 1 week.  Management plans discussed with the patient, family and they are in agreement.  CODE STATUS: Full code Code Status History    Date Active Date Inactive Code Status Order ID Comments User Context   01/30/2018 0145 02/01/2018 1608 Full Code [id number]  Arta Silence, MD Inpatient   01/30/2018 0145 01/30/2018 0145 Full Code [id number]  Arta Silence, MD Inpatient   01/14/2018 1553 01/16/2018 1636 Full Code [id number]  Bettey Costa, MD Inpatient   01/08/2018 2351 01/11/2018 2010 Full Code [id number]  Amelia Jo, MD ED   10/30/2017 0113 11/01/2017 1421 Full Code [id number]  Lance Coon, MD ED   09/24/2017 0848 09/27/2017 1409 Full Code [id number]  Loletha Grayer, MD ED   07/03/2017 0654 07/05/2017 1248 Full Code [id number]  Saundra Shelling, MD ED   04/25/2017 0146 04/26/2017 1516 Full Code [id number]  Hugelmeyer, Alexis, DO ED   01/28/2017 1033 01/30/2017 1741 Full Code [id number]  [name], MD Inpatient   01/22/2017 0114 01/24/2017 1759 Full Code [id number]  Lance Coon, MD Inpatient   12/09/2016 0209 12/09/2016 1507 Full Code [id number]  Lance Coon, MD Inpatient   11/21/2015 0650 11/22/2015 1550 Full Code [id number]  Saundra Shelling, MD Inpatient   09/19/2015 0511 09/21/2015 1829 Full Code [id number]  Harrie Foreman, MD Inpatient    Advance Directive Documentation     Most Recent Value  Type of Advance Directive  Healthcare Power of Attorney, Living will  Pre-existing out of facility DNR order (yellow form or pink MOST form)  -  "MOST" Form in Place?  -      TOTAL TIME TAKING CARE OF THIS PATIENT ON DAY OF DISCHARGE: more than 35 minutes.   Saundra Shelling M.D on 02/01/2018 at 4:53  PM  Between 7am to 6pm - Pager - [phone]  After 6pm go to [email or web address] - password [email or web address] [facility]  Office  [phone]  CC: Primary care physician; [name], MD  Note: This dictation was prepared with Dragon dictation along with smaller phrase technology. Any transcriptional errors that result from this process are unintentional.

## 2018-02-01 NOTE — Progress Notes (Signed)
Evon Slack Willinger to be D/C'd Home per MD order.  Discussed prescriptions and follow up appointments with the patient. Prescriptions given to patient, medication list explained in detail. Pt verbalized understanding.  Allergies as of 02/01/2018      Reactions   Contrast Media [iodinated Diagnostic Agents] Shortness Of Breath   Per CT report in 2014 pt had break through contrast reaction with hives and SOB following 13 hour prep. MSY    Metrizamide Shortness Of Breath   Penicillins Hives, Other (See Comments)   Other reaction(s): Other (See Comments) Has patient had a PCN reaction causing immediate rash, facial/tongue/throat swelling, SOB or lightheadedness with hypotension: Yes Has patient had a PCN reaction causing severe rash involving mucus membranes or skin necrosis: No Has patient had a PCN reaction that required hospitalization No Has patient had a PCN reaction occurring within the last 10 years: No If all of the above answers are "NO", then may proceed with Cephalosporin use. Has patient had a PCN reaction causing immediate rash, facial/tongue/throat swelling, SOB or lightheadedness with hypotension: Yes Has patient had a PCN reaction causing severe rash involving mucus membranes or skin necrosis: No Has patient had a PCN reaction that required hospitalization No Has patient had a PCN reaction occurring within the last 10 years: No If all of the above answers are "NO", then may proceed with Cephalosporin use.   Isosorbide Nitrate    Other reaction(s): Headache   Latex Hives   Ondansetron Other (See Comments)   Severe headache   Zofran [ondansetron Hcl] Other (See Comments)   Causes migraines   Povidone-iodine Rash   Blistering rash   Pulmicort [budesonide] Itching      Medication List    TAKE these medications   acetaminophen 325 MG tablet Commonly known as:  TYLENOL Take 2 tablets (650 mg total) by mouth every 6 (six) hours as needed for mild pain (or Fever >/= 101).    albuterol 1.25 MG/3ML nebulizer solution Commonly known as:  ACCUNEB Take 3 mLs by nebulization every 6 (six) hours as needed for wheezing.   beclomethasone 80 MCG/ACT inhaler Commonly known as:  QVAR Inhale 2 puffs into the lungs 2 (two) times daily.   dicyclomine 10 MG capsule Commonly known as:  BENTYL Take 10 mg by mouth 3 (three) times daily as needed (abdominal pain).   ferrous sulfate 325 (65 FE) MG tablet Take 325 mg by mouth daily with breakfast.   losartan 25 MG tablet Commonly known as:  COZAAR Take 12.5 mg by mouth 2 (two) times daily.   metoprolol succinate 100 MG 24 hr tablet Commonly known as:  TOPROL-XL Take 1 tablet (100 mg total) by mouth daily. Take with or immediately following a meal.   multivitamin with minerals Tabs tablet Take 1 tablet by mouth daily.   nitroGLYCERIN 0.4 MG SL tablet Commonly known as:  NITROSTAT Place 1 tablet (0.4 mg total) under the tongue every 5 (five) minutes as needed for chest pain.   pantoprazole 40 MG tablet Commonly known as:  PROTONIX Take 1 tablet (40 mg total) by mouth daily. What changed:  when to take this   prochlorperazine 10 MG tablet Commonly known as:  COMPAZINE Take 1 tablet (10 mg total) by mouth every 6 (six) hours as needed for nausea or vomiting.   rOPINIRole 2 MG tablet Commonly known as:  REQUIP Take 2 tablets (4 mg total) by mouth at bedtime. 2-4  Mg per night   sertraline 50 MG tablet Commonly  known as:  ZOLOFT Take 50 mg by mouth daily.   spironolactone 50 MG tablet Commonly known as:  ALDACTONE Take 50 mg by mouth daily.   traMADol 50 MG tablet Commonly known as:  ULTRAM Take 1 tablet (50 mg total) by mouth every 6 (six) hours as needed for moderate pain or severe pain. What changed:  reasons to take this   vitamin B-12 500 MCG tablet Commonly known as:  CYANOCOBALAMIN Take 500 mcg by mouth daily.       Vitals:   02/01/18 0813 02/01/18 1219  BP:  112/84  Pulse:  100  Resp:  18   Temp:    SpO2: 97% 95%  Tele box removed and returned. Skin clean, dry and intact without evidence of skin break down, no evidence of skin tears noted. IV catheter discontinued intact. Site without signs and symptoms of complications. Dressing and pressure applied. Pt denies pain at this time. No complaints noted.  An After Visit Summary was printed and given to the patient. Patient escorted via [place], and D/C home via private auto.  Brittany Nguyen

## 2018-02-07 ENCOUNTER — Encounter: Admission: RE | Payer: Self-pay | Source: Ambulatory Visit

## 2018-02-07 ENCOUNTER — Ambulatory Visit: Admission: RE | Admit: 2018-02-07 | Payer: Medicaid Other | Source: Ambulatory Visit | Admitting: Internal Medicine

## 2018-02-07 SURGERY — COLONOSCOPY WITH PROPOFOL
Anesthesia: General

## 2018-02-19 ENCOUNTER — Other Ambulatory Visit
Admission: RE | Admit: 2018-02-19 | Discharge: 2018-02-19 | Disposition: A | Discharge status: Home / Self Care | Payer: Medicaid Other | Source: Ambulatory Visit | Attending: Gastroenterology | Admitting: Gastroenterology

## 2018-02-19 DIAGNOSIS — R197 Diarrhea, unspecified: Secondary | ICD-10-CM | POA: Insufficient documentation

## 2018-02-19 LAB — GASTROINTESTINAL PANEL BY PCR, STOOL (REPLACES STOOL CULTURE)
ADENOVIRUS F40/41: NOT DETECTED
ASTROVIRUS: NOT DETECTED
CYCLOSPORA CAYETANENSIS: NOT DETECTED
Campylobacter species: NOT DETECTED
Cryptosporidium: NOT DETECTED
ENTAMOEBA HISTOLYTICA: NOT DETECTED
ENTEROPATHOGENIC E COLI (EPEC): NOT DETECTED
ENTEROTOXIGENIC E COLI (ETEC): NOT DETECTED
Enteroaggregative E coli (EAEC): NOT DETECTED
GIARDIA LAMBLIA: NOT DETECTED
Norovirus GI/GII: NOT DETECTED
Plesimonas shigelloides: NOT DETECTED
Rotavirus A: NOT DETECTED
Salmonella species: NOT DETECTED
Sapovirus (I, II, IV, and V): NOT DETECTED
Shiga like toxin producing E coli (STEC): NOT DETECTED
Shigella/Enteroinvasive E coli (EIEC): NOT DETECTED
VIBRIO CHOLERAE: NOT DETECTED
VIBRIO SPECIES: NOT DETECTED
Yersinia enterocolitica: NOT DETECTED

## 2018-02-19 LAB — C DIFFICILE QUICK SCREEN W PCR REFLEX
C Diff antigen: NEGATIVE
C Diff interpretation: NOT DETECTED
C Diff toxin: NEGATIVE

## 2018-02-20 ENCOUNTER — Other Ambulatory Visit: Payer: Self-pay

## 2018-02-20 ENCOUNTER — Encounter: Payer: Self-pay | Admitting: Emergency Medicine

## 2018-02-20 ENCOUNTER — Observation Stay
Admission: EM | Admit: 2018-02-20 | Discharge: 2018-02-23 | Disposition: A | Discharge status: Home / Self Care | Payer: Medicaid Other | Attending: Internal Medicine | Admitting: Internal Medicine

## 2018-02-20 DIAGNOSIS — G2581 Restless legs syndrome: Secondary | ICD-10-CM | POA: Insufficient documentation

## 2018-02-20 DIAGNOSIS — K5730 Diverticulosis of large intestine without perforation or abscess without bleeding: Secondary | ICD-10-CM | POA: Diagnosis not present

## 2018-02-20 DIAGNOSIS — K625 Hemorrhage of anus and rectum: Secondary | ICD-10-CM | POA: Diagnosis not present

## 2018-02-20 DIAGNOSIS — Z9989 Dependence on other enabling machines and devices: Secondary | ICD-10-CM | POA: Insufficient documentation

## 2018-02-20 DIAGNOSIS — Z950 Presence of cardiac pacemaker: Secondary | ICD-10-CM | POA: Insufficient documentation

## 2018-02-20 DIAGNOSIS — K3189 Other diseases of stomach and duodenum: Secondary | ICD-10-CM | POA: Diagnosis not present

## 2018-02-20 DIAGNOSIS — Z7951 Long term (current) use of inhaled steroids: Secondary | ICD-10-CM | POA: Diagnosis not present

## 2018-02-20 DIAGNOSIS — I5042 Chronic combined systolic (congestive) and diastolic (congestive) heart failure: Secondary | ICD-10-CM | POA: Diagnosis present

## 2018-02-20 DIAGNOSIS — Z8614 Personal history of Methicillin resistant Staphylococcus aureus infection: Secondary | ICD-10-CM | POA: Insufficient documentation

## 2018-02-20 DIAGNOSIS — G4730 Sleep apnea, unspecified: Secondary | ICD-10-CM | POA: Insufficient documentation

## 2018-02-20 DIAGNOSIS — I5022 Chronic systolic (congestive) heart failure: Secondary | ICD-10-CM | POA: Diagnosis present

## 2018-02-20 DIAGNOSIS — Z79899 Other long term (current) drug therapy: Secondary | ICD-10-CM | POA: Diagnosis not present

## 2018-02-20 DIAGNOSIS — R197 Diarrhea, unspecified: Secondary | ICD-10-CM | POA: Insufficient documentation

## 2018-02-20 DIAGNOSIS — K2950 Unspecified chronic gastritis without bleeding: Secondary | ICD-10-CM | POA: Diagnosis not present

## 2018-02-20 DIAGNOSIS — Z6836 Body mass index (BMI) 36.0-36.9, adult: Secondary | ICD-10-CM | POA: Diagnosis not present

## 2018-02-20 DIAGNOSIS — K449 Diaphragmatic hernia without obstruction or gangrene: Secondary | ICD-10-CM | POA: Insufficient documentation

## 2018-02-20 DIAGNOSIS — K640 First degree hemorrhoids: Secondary | ICD-10-CM | POA: Diagnosis not present

## 2018-02-20 DIAGNOSIS — E876 Hypokalemia: Secondary | ICD-10-CM | POA: Diagnosis present

## 2018-02-20 DIAGNOSIS — R110 Nausea: Secondary | ICD-10-CM | POA: Insufficient documentation

## 2018-02-20 DIAGNOSIS — K2980 Duodenitis without bleeding: Secondary | ICD-10-CM | POA: Insufficient documentation

## 2018-02-20 DIAGNOSIS — Z86718 Personal history of other venous thrombosis and embolism: Secondary | ICD-10-CM | POA: Diagnosis not present

## 2018-02-20 DIAGNOSIS — J449 Chronic obstructive pulmonary disease, unspecified: Secondary | ICD-10-CM | POA: Diagnosis present

## 2018-02-20 DIAGNOSIS — Z980 Intestinal bypass and anastomosis status: Secondary | ICD-10-CM | POA: Insufficient documentation

## 2018-02-20 DIAGNOSIS — I2510 Atherosclerotic heart disease of native coronary artery without angina pectoris: Secondary | ICD-10-CM | POA: Insufficient documentation

## 2018-02-20 DIAGNOSIS — K219 Gastro-esophageal reflux disease without esophagitis: Secondary | ICD-10-CM | POA: Diagnosis not present

## 2018-02-20 DIAGNOSIS — R109 Unspecified abdominal pain: Secondary | ICD-10-CM | POA: Diagnosis present

## 2018-02-20 DIAGNOSIS — R1013 Epigastric pain: Secondary | ICD-10-CM | POA: Diagnosis not present

## 2018-02-20 DIAGNOSIS — K2960 Other gastritis without bleeding: Secondary | ICD-10-CM | POA: Diagnosis not present

## 2018-02-20 LAB — COMPREHENSIVE METABOLIC PANEL
ALBUMIN: 3.8 g/dL (ref 3.5–5.0)
ALT: 18 U/L (ref 14–54)
AST: 22 U/L (ref 15–41)
Alkaline Phosphatase: 117 U/L (ref 38–126)
Anion gap: 11 (ref 5–15)
BUN: 15 mg/dL (ref 6–20)
CHLORIDE: 101 mmol/L (ref 101–111)
CO2: 28 mmol/L (ref 22–32)
Calcium: 9.1 mg/dL (ref 8.9–10.3)
Creatinine, Ser: 0.85 mg/dL (ref 0.44–1.00)
GFR calc Af Amer: >60 mL/min (ref >60)
GFR calc non Af Amer: >60 mL/min (ref >60)
GLUCOSE: 129 mg/dL — AB (ref 65–99)
POTASSIUM: 2.5 mmol/L — AB (ref 3.5–5.1)
SODIUM: 140 mmol/L (ref 135–145)
Total Bilirubin: 0.2 mg/dL — ABNORMAL LOW (ref 0.3–1.2)
Total Protein: 7.8 g/dL (ref 6.5–8.1)

## 2018-02-20 LAB — CBC
HCT: 35.8 % (ref 35.0–47.0)
Hemoglobin: 12 g/dL (ref 12.0–16.0)
MCH: 26.6 pg (ref 26.0–34.0)
MCHC: 33.4 g/dL (ref 32.0–36.0)
MCV: 79.7 fL — ABNORMAL LOW (ref 80.0–100.0)
PLATELETS: 313 10*3/uL (ref 150–440)
RBC: 4.5 MIL/uL (ref 3.80–5.20)
RDW: 16.4 % — ABNORMAL HIGH (ref 11.5–14.5)
WBC: 8.9 10*3/uL (ref 3.6–11.0)

## 2018-02-20 LAB — LIPASE, BLOOD: LIPASE: 37 U/L (ref 11–51)

## 2018-02-20 MED ORDER — DICYCLOMINE HCL 10 MG PO CAPS
10.0000 mg | ORAL_CAPSULE | Freq: Three times a day (TID) | ORAL | Status: DC | PRN
  Administered 2018-02-21: 10 mg via ORAL
  Filled 2018-02-20 (×2): qty 1

## 2018-02-20 MED ORDER — HYDROMORPHONE HCL 1 MG/ML IJ SOLN
1.0000 mg | Freq: Once | INTRAMUSCULAR | Status: AC
  Administered 2018-02-20: 1 mg via INTRAVENOUS
  Filled 2018-02-20: qty 1

## 2018-02-20 MED ORDER — ACETAMINOPHEN 650 MG RE SUPP: 650.0000 mg | Freq: Four times a day (QID) | RECTAL | Status: DC | PRN

## 2018-02-20 MED ORDER — ENOXAPARIN SODIUM 40 MG/0.4ML ~~LOC~~ SOLN
40.0000 mg | SUBCUTANEOUS | Status: DC
  Administered 2018-02-21 – 2018-02-23 (×3): 40 mg via SUBCUTANEOUS
  Filled 2018-02-20 (×3): qty 0.4

## 2018-02-20 MED ORDER — SODIUM CHLORIDE 0.9 % IV BOLUS
1000.0000 mL | Freq: Once | INTRAVENOUS | Status: AC
  Administered 2018-02-20: 1000 mL via INTRAVENOUS

## 2018-02-20 MED ORDER — BECLOMETHASONE DIPROPIONATE 80 MCG/ACT IN AERS: 2.0000 | INHALATION_SPRAY | Freq: Two times a day (BID) | RESPIRATORY_TRACT | Status: DC

## 2018-02-20 MED ORDER — POTASSIUM CHLORIDE 10 MEQ/100ML IV SOLN
10.0000 meq | Freq: Once | INTRAVENOUS | Status: AC
  Administered 2018-02-20: 10 meq via INTRAVENOUS
  Filled 2018-02-20: qty 100

## 2018-02-20 MED ORDER — POTASSIUM CHLORIDE 10 MEQ/100ML IV SOLN
10.0000 meq | INTRAVENOUS | Status: AC
Start: 2018-02-21 — End: 2018-02-21
  Administered 2018-02-21 (×5): 10 meq via INTRAVENOUS
  Filled 2018-02-20 (×5): qty 100

## 2018-02-20 MED ORDER — SODIUM CHLORIDE 0.9 % IV SOLN
INTRAVENOUS | Status: AC
  Administered 2018-02-21 – 2018-02-22 (×4): via INTRAVENOUS

## 2018-02-20 MED ORDER — OXYCODONE HCL 5 MG PO TABS
5.0000 mg | ORAL_TABLET | ORAL | Status: DC | PRN
  Administered 2018-02-21 – 2018-02-22 (×8): 5 mg via ORAL
  Filled 2018-02-20 (×8): qty 1

## 2018-02-20 MED ORDER — PROMETHAZINE HCL 25 MG/ML IJ SOLN
25.0000 mg | Freq: Once | INTRAMUSCULAR | Status: AC
  Administered 2018-02-20: 25 mg via INTRAVENOUS

## 2018-02-20 MED ORDER — PROMETHAZINE HCL 25 MG/ML IJ SOLN
12.5000 mg | Freq: Four times a day (QID) | INTRAMUSCULAR | Status: DC | PRN
  Administered 2018-02-21 – 2018-02-23 (×7): 25 mg via INTRAVENOUS
  Filled 2018-02-20 (×7): qty 1

## 2018-02-20 MED ORDER — SERTRALINE HCL 50 MG PO TABS
50.0000 mg | ORAL_TABLET | Freq: Every day | ORAL | Status: DC
  Administered 2018-02-21 – 2018-02-23 (×3): 50 mg via ORAL
  Filled 2018-02-20 (×3): qty 1

## 2018-02-20 MED ORDER — PROMETHAZINE HCL 25 MG/ML IJ SOLN
INTRAMUSCULAR | Status: AC
  Administered 2018-02-20: 25 mg via INTRAVENOUS
  Filled 2018-02-20: qty 1

## 2018-02-20 MED ORDER — HYDROMORPHONE HCL 1 MG/ML IJ SOLN
0.2500 mg | INTRAMUSCULAR | Status: DC | PRN
  Administered 2018-02-21 (×2): 0.25 mg via INTRAVENOUS
  Filled 2018-02-20 (×2): qty 0.5

## 2018-02-20 MED ORDER — MORPHINE SULFATE (PF) 4 MG/ML IV SOLN
4.0000 mg | Freq: Once | INTRAVENOUS | Status: AC
  Administered 2018-02-20: 4 mg via INTRAVENOUS
  Filled 2018-02-20: qty 1

## 2018-02-20 MED ORDER — PANTOPRAZOLE SODIUM 40 MG PO TBEC
40.0000 mg | DELAYED_RELEASE_TABLET | Freq: Every day | ORAL | Status: DC
  Administered 2018-02-21 – 2018-02-23 (×4): 40 mg via ORAL
  Filled 2018-02-20 (×3): qty 1

## 2018-02-20 MED ORDER — PROMETHAZINE HCL 25 MG/ML IJ SOLN
25.0000 mg | Freq: Once | INTRAMUSCULAR | Status: AC
  Administered 2018-02-20: 25 mg via INTRAVENOUS
  Filled 2018-02-20: qty 1

## 2018-02-20 MED ORDER — ACETAMINOPHEN 325 MG PO TABS: 650.0000 mg | ORAL_TABLET | Freq: Four times a day (QID) | ORAL | Status: DC | PRN

## 2018-02-20 MED ORDER — ONDANSETRON HCL 4 MG/2ML IJ SOLN
INTRAMUSCULAR | Status: AC
  Filled 2018-02-20: qty 2

## 2018-02-20 NOTE — H&P (Signed)
[facility] NAME: Brittany Nguyen    MR#:  [id number]  DATE OF BIRTH:  08-14-1966  DATE OF ADMISSION:  02/20/2018  PRIMARY CARE PHYSICIAN: [name], MD   REQUESTING/REFERRING PHYSICIAN: Cherylann Banas, MD  CHIEF COMPLAINT:   Chief Complaint  Patient presents with  . Abdominal Pain    HISTORY OF PRESENT ILLNESS:  Brittany Nguyen  is a 52 y.o. female who presents with 2 days of progressive abdominal discomfort with nausea and vomiting and diarrhea.  Work-up in the ED is largely within normal limits.  Patient's symptoms are persistent despite several rounds of medications in the ED.  She is also hypokalemic.  Hospitalist were called for admission  PAST MEDICAL HISTORY:   Past Medical History:  Diagnosis Date  . Asthma 2013  . CHF (congestive heart failure) ([place])   . Diverticulitis 2010  . DVT (deep venous thrombosis) ([place])   . Endometriosis 1990  . Heart disease 2013  . Hx MRSA infection   . Lump or mass in breast   . Restless leg      PAST SURGICAL HISTORY:   Past Surgical History:  Procedure Laterality Date  . ABDOMINAL HYSTERECTOMY     age 14  . CARDIAC DEFIBRILLATOR PLACEMENT    . CESAREAN SECTION    . CHOLECYSTECTOMY    . COLECTOMY    . NASAL SINUS SURGERY  2012     SOCIAL HISTORY:   Social History   Tobacco Use  . Smoking status: Never Smoker  . Smokeless tobacco: Never Used  Substance Use Topics  . Alcohol use: No     FAMILY HISTORY:   Family History  Problem Relation Age of Onset  . Cancer Mother 30       ovarian  . CAD Mother   . Cancer Father 33       brain  . CAD Father   . Cancer Daughter 18       skin  . Cancer Maternal Aunt 34       breast     DRUG ALLERGIES:   Allergies  Allergen Reactions  . Contrast Media [Iodinated Diagnostic Agents] Shortness Of Breath    Per CT report in 2014 pt had break through contrast reaction with hives and SOB following 13 hour prep. MSY    . Metrizamide Shortness Of Breath  . Penicillins Hives and Other (See Comments)    Other reaction(s): Other (See Comments) Has patient had a PCN reaction causing immediate rash, facial/tongue/throat swelling, SOB or lightheadedness with hypotension: Yes Has patient had a PCN reaction causing severe rash involving mucus membranes or skin necrosis: No Has patient had a PCN reaction that required hospitalization No Has patient had a PCN reaction occurring within the last 10 years: No If all of the above answers are "NO", then may proceed with Cephalosporin use. Has patient had a PCN reaction causing immediate rash, facial/tongue/throat swelling, SOB or lightheadedness with hypotension: Yes Has patient had a PCN reaction causing severe rash involving mucus membranes or skin necrosis: No Has patient had a PCN reaction that required hospitalization No Has patient had a PCN reaction occurring within the last 10 years: No If all of the above answers are "NO", then may proceed with Cephalosporin use.   . Isosorbide Nitrate     Other reaction(s): Headache  . Latex Hives  . Ondansetron Other (See Comments)    Severe headache  . Zofran [Ondansetron Hcl] Other (See Comments)  Causes migraines  . Povidone-Iodine Rash    Blistering rash  . Pulmicort [Budesonide] Itching    MEDICATIONS AT HOME:   Prior to Admission medications   Medication Sig Start Date End Date Taking? Authorizing Provider  cyanocobalamin 500 MCG tablet Take 500 mcg by mouth daily.   Yes [provider]  ferrous sulfate 325 (65 FE) MG tablet Take 325 mg by mouth daily with breakfast.   Yes [provider]  losartan (COZAAR) 25 MG tablet Take 12.5 mg by mouth 2 (two) times daily.   Yes [provider]  metoprolol succinate (TOPROL-XL) 100 MG 24 hr tablet Take 1 tablet (100 mg total) by mouth daily. Take with or immediately following a meal. 01/16/18  Yes Wieting, Richard, MD  Multiple Vitamin  (MULTIVITAMIN WITH MINERALS) TABS tablet Take 1 tablet by mouth daily.   Yes [provider]  pantoprazole (PROTONIX) 40 MG tablet Take 1 tablet (40 mg total) by mouth daily. 02/01/18  Yes Pyreddy, Reatha Harps, MD  rOPINIRole (REQUIP) 2 MG tablet Take 2 tablets (4 mg total) by mouth at bedtime. 2-4  Mg per night Patient taking differently: Take 4 mg by mouth at bedtime.  01/16/18  Yes Wieting, Richard, MD  sertraline (ZOLOFT) 50 MG tablet Take 50 mg by mouth daily.   Yes [provider]  spironolactone (ALDACTONE) 50 MG tablet Take 50 mg by mouth daily.   Yes [provider]  acetaminophen (TYLENOL) 325 MG tablet Take 2 tablets (650 mg total) by mouth every 6 (six) hours as needed for mild pain (or Fever >/= 101). 11/01/17   Gouru, Illene Silver, MD  albuterol (ACCUNEB) 1.25 MG/3ML nebulizer solution Take 3 mLs by nebulization every 6 (six) hours as needed for wheezing. 11/09/17   [provider]  beclomethasone (QVAR) 80 MCG/ACT inhaler Inhale 2 puffs into the lungs 2 (two) times daily. 06/05/17 06/05/18  Laverle Hobby, MD  dicyclomine (BENTYL) 10 MG capsule Take 10 mg by mouth 3 (three) times daily as needed (abdominal pain).  12/29/17   [provider]  hyoscyamine (LEVSIN SL) 0.125 MG SL tablet Place 1 tablet under the tongue every 6 (six) hours as needed for diarrhea or loose stools. 02/13/18   [provider]  nitroGLYCERIN (NITROSTAT) 0.4 MG SL tablet Place 1 tablet (0.4 mg total) under the tongue every 5 (five) minutes as needed for chest pain. 01/24/17   Henreitta Leber, MD  prochlorperazine (COMPAZINE) 10 MG tablet Take 1 tablet (10 mg total) by mouth every 6 (six) hours as needed for nausea or vomiting. Patient not taking: Reported on 01/29/2018 01/16/18   Loletha Grayer, MD  traMADol (ULTRAM) 50 MG tablet Take 1 tablet (50 mg total) by mouth every 6 (six) hours as needed for moderate pain or severe pain. 02/01/18   Saundra Shelling, MD    REVIEW OF  SYSTEMS:  Review of Systems  Constitutional: Negative for chills, fever, malaise/fatigue and weight loss.  HENT: Negative for ear pain, hearing loss and tinnitus.   Eyes: Negative for blurred vision, double vision, pain and redness.  Respiratory: Negative for cough, hemoptysis and shortness of breath.   Cardiovascular: Negative for chest pain, palpitations, orthopnea and leg swelling.  Gastrointestinal: Positive for abdominal pain, diarrhea, nausea and vomiting. Negative for constipation.  Genitourinary: Negative for dysuria, frequency and hematuria.  Musculoskeletal: Negative for back pain, joint pain and neck pain.  Skin:       No acne, rash, or lesions  Neurological: Negative for dizziness, tremors,  focal weakness and weakness.  Endo/Heme/Allergies: Negative for polydipsia. Does not bruise/bleed easily.  Psychiatric/Behavioral: Negative for depression. The patient is not nervous/anxious and does not have insomnia.      VITAL SIGNS:   Vitals:   02/20/18 1900 02/20/18 1930 02/20/18 1933 02/20/18 2100  BP: 114/76 131/83  133/82  Pulse: 94  93   Resp:  17 18 19   Temp:      TempSrc:      SpO2: 97%  98%   Weight:      Height:       Wt Readings from Last 3 Encounters:  02/20/18 99.8 kg (220 lb)  02/01/18 101.3 kg (223 lb 6.4 oz)  01/14/18 99.8 kg (220 lb)    PHYSICAL EXAMINATION:  Physical Exam  Vitals reviewed. Constitutional: She is oriented to person, place, and time. She appears well-developed and well-nourished. No distress.  HENT:  Head: Normocephalic and atraumatic.  Mouth/Throat: Oropharynx is clear and moist.  Eyes: Pupils are equal, round, and reactive to light. Conjunctivae and EOM are normal. No scleral icterus.  Neck: Normal range of motion. Neck supple. No JVD present. No thyromegaly present.  Cardiovascular: Normal rate, regular rhythm and intact distal pulses. Exam reveals no gallop and no friction rub.  No murmur heard. Respiratory: Effort normal and breath  sounds normal. No respiratory distress. She has no wheezes. She has no rales.  GI: Soft. Bowel sounds are normal. She exhibits no distension. There is tenderness.  Musculoskeletal: Normal range of motion. She exhibits no edema.  No arthritis, no gout  Lymphadenopathy:    She has no cervical adenopathy.  Neurological: She is alert and oriented to person, place, and time. No cranial nerve deficit.  No dysarthria, no aphasia  Skin: Skin is warm and dry. No rash noted. No erythema.  Psychiatric: She has a normal mood and affect. Her behavior is normal. Judgment and thought content normal.    LABORATORY PANEL:   CBC Recent Labs  Lab 02/20/18 1731  WBC 8.9  HGB 12.0  HCT 35.8  PLT 313   ------------------------------------------------------------------------------------------------------------------  Chemistries  Recent Labs  Lab 02/20/18 1731  NA 140  K 2.5*  CL 101  CO2 28  GLUCOSE 129*  BUN 15  CREATININE 0.85  CALCIUM 9.1  AST 22  ALT 18  ALKPHOS 117  BILITOT 0.2*   ------------------------------------------------------------------------------------------------------------------  Cardiac Enzymes No results for input(s): TROPONINI in the last 168 hours. ------------------------------------------------------------------------------------------------------------------  RADIOLOGY:  No results found.  EKG:   Orders placed or performed during the hospital encounter of 02/20/18  . EKG 12-Lead  . EKG 12-Lead  . ED EKG  . ED EKG    IMPRESSION AND PLAN:  Principal Problem:   Abdominal pain -PRN analgesia and antiemetics, patient was C. difficile and GI panel negative, white count was normal, she is not improving by morning we should perhaps pursue imaging for further work-up Active Problems:   Hypokalemia -due to her diarrhea and vomiting.  We will replace her potassium and monitor   Chronic systolic heart failure ([place]) -continue home meds   COPD (chronic  obstructive pulmonary disease) ([place]) -home dose inhalers   GERD -home dose PPI  Chart review performed and case discussed with ED provider. Labs, imaging and/or ECG reviewed by provider and discussed with patient/family. Management plans discussed with the patient and/or family.  DVT PROPHYLAXIS: SubQ lovenox  GI PROPHYLAXIS: PPI  ADMISSION STATUS: Observation  CODE STATUS: Full Code Status History    Date Active Date  Inactive Code Status Order ID Comments User Context   01/30/2018 0145 02/01/2018 1608 Full Code [id number]  Arta Silence, MD Inpatient   01/30/2018 0145 01/30/2018 0145 Full Code [id number]  Arta Silence, MD Inpatient   01/14/2018 1553 01/16/2018 1636 Full Code [id number]  Bettey Costa, MD Inpatient   01/08/2018 2351 01/11/2018 2010 Full Code [id number]  Amelia Jo, MD ED   10/30/2017 0113 11/01/2017 1421 Full Code [id number]  Lance Coon, MD ED   09/24/2017 0848 09/27/2017 1409 Full Code [id number]  Loletha Grayer, MD ED   07/03/2017 0654 07/05/2017 1248 Full Code [id number]  Saundra Shelling, MD ED   04/25/2017 0146 04/26/2017 1516 Full Code [id number]  Hugelmeyer, Alexis, DO ED   01/28/2017 1033 01/30/2017 1741 Full Code [id number]  [name], MD Inpatient   01/22/2017 0114 01/24/2017 1759 Full Code [id number]  Lance Coon, MD Inpatient   12/09/2016 0209 12/09/2016 1507 Full Code [id number]  Lance Coon, MD Inpatient   11/21/2015 0650 11/22/2015 1550 Full Code [id number]  Saundra Shelling, MD Inpatient   09/19/2015 0511 09/21/2015 1829 Full Code [id number]  Harrie Foreman, MD Inpatient    Advance Directive Documentation     Most Recent Value  Type of Advance Directive  Healthcare Power of Attorney, Living will  Pre-existing out of facility DNR order (yellow form or pink MOST form)  -  "MOST" Form in Place?  -      TOTAL TIME TAKING CARE OF THIS PATIENT: 40 minutes.   Luc Shammas [employer] 02/20/2018, 10:51 PM  [facility]  Office   [phone]  CC: Primary care physician; [name], MD  Note:  This document was prepared using Dragon voice recognition software and may include unintentional dictation errors.

## 2018-02-20 NOTE — ED Provider Notes (Addendum)
[facility] Emergency Department Provider Note ____________________________________________   First MD Initiated Contact with Patient 02/20/18 1759     (approximate)  I have reviewed the triage vital signs and the nursing notes.   HISTORY  Chief Complaint Abdominal Pain    HPI Brittany Nguyen is a 52 y.o. female with PMH as noted below who presents with upper and lower abdominal pain over the last month, described as crampy, and associated with nausea and several episodes of vomiting today as well as with chronic diarrhea.  The patient states that she has had this crampy pain for about a month and is currently being worked up by her gastroenterologist.  She is scheduled for upper and lower endoscopy in July.  She states that her cramping acutely worsened over the last 2 days, and she called her gastroenterologist who told her to come to the emergency department for evaluation.  She states that the pain is otherwise similar to what she has been having over the last month.  She denies fever, urinary symptoms, or vaginal bleeding.  She denies any change in her chronic diarrhea.   Past Medical History:  Diagnosis Date  . Asthma 2013  . CHF (congestive heart failure) ([place])   . Diverticulitis 2010  . DVT (deep venous thrombosis) ([place])   . Endometriosis 1990  . Heart disease 2013  . Hx MRSA infection   . Lump or mass in breast   . Restless leg     Patient Active Problem List   Diagnosis Date Noted  . Abdominal pain 02/20/2018  . Unstable angina ([place])   . Encounter for anticoagulation discussion and counseling   . C. difficile colitis 01/14/2018  . GIB (gastrointestinal bleeding) 01/08/2018  . SOB (shortness of breath) 11/01/2017  . Influenza A 10/29/2017  . Acute respiratory failure ([place]) 09/24/2017  . Sepsis ([place]) 07/03/2017  . COPD (chronic obstructive pulmonary disease) ([place]) 04/26/2017  . Community acquired pneumonia 04/25/2017  . Chronic  systolic heart failure ([place]) 02/03/2017  . Hypotension 02/03/2017  . Chest pain 12/09/2016  . RLS (restless legs syndrome) 12/09/2016  . Cough, persistent 11/22/2015  . Hypokalemia 11/22/2015  . Leukocytosis 11/22/2015  . Dyspnea 11/21/2015  . Respiratory distress 09/19/2015  . Breast pain 01/11/2013  . Hx MRSA infection   . Lump or mass in breast   . GOITER, MULTINODULAR 01/20/2009  . HYPOGLYCEMIA, UNSPECIFIED 01/20/2009  . GERD 01/20/2009  . DIVERTICULITIS OF COLON 01/20/2009    Past Surgical History:  Procedure Laterality Date  . ABDOMINAL HYSTERECTOMY     age 34  . CARDIAC DEFIBRILLATOR PLACEMENT    . CESAREAN SECTION    . CHOLECYSTECTOMY    . COLECTOMY    . COLONOSCOPY WITH PROPOFOL N/A 02/22/2018   Procedure: COLONOSCOPY WITH PROPOFOL;  Surgeon: Toledo, Benay Pike, MD;  Location: [facility] ENDOSCOPY;  Service: Gastroenterology;  Laterality: N/A;  . ESOPHAGOGASTRODUODENOSCOPY (EGD) WITH PROPOFOL N/A 02/22/2018   Procedure: ESOPHAGOGASTRODUODENOSCOPY (EGD) WITH PROPOFOL;  Surgeon: Toledo, Benay Pike, MD;  Location: [facility] ENDOSCOPY;  Service: Gastroenterology;  Laterality: N/A;  . NASAL SINUS SURGERY  2012    Prior to Admission medications   Medication Sig Start Date End Date Taking? Authorizing Provider  cyanocobalamin 500 MCG tablet Take 500 mcg by mouth daily.   Yes [provider]  ferrous sulfate 325 (65 FE) MG tablet Take 325 mg by mouth daily with breakfast.   Yes [provider]  losartan (COZAAR) 25 MG tablet Take 12.5 mg by  mouth 2 (two) times daily.   Yes [provider]  metoprolol succinate (TOPROL-XL) 100 MG 24 hr tablet Take 1 tablet (100 mg total) by mouth daily. Take with or immediately following a meal. 01/16/18  Yes Wieting, Richard, MD  Multiple Vitamin (MULTIVITAMIN WITH MINERALS) TABS tablet Take 1 tablet by mouth daily.   Yes [provider]  pantoprazole (PROTONIX) 40 MG tablet Take 1 tablet (40 mg total) by mouth daily.  02/01/18  Yes Pyreddy, Reatha Harps, MD  rOPINIRole (REQUIP) 2 MG tablet Take 2 tablets (4 mg total) by mouth at bedtime. 2-4  Mg per night Patient taking differently: Take 4 mg by mouth at bedtime.  01/16/18  Yes Wieting, Richard, MD  sertraline (ZOLOFT) 50 MG tablet Take 50 mg by mouth daily.   Yes [provider]  spironolactone (ALDACTONE) 50 MG tablet Take 50 mg by mouth daily.   Yes [provider]  acetaminophen (TYLENOL) 325 MG tablet Take 2 tablets (650 mg total) by mouth every 6 (six) hours as needed for mild pain (or Fever >/= 101). 11/01/17   Gouru, Illene Silver, MD  albuterol (ACCUNEB) 1.25 MG/3ML nebulizer solution Take 3 mLs by nebulization every 6 (six) hours as needed for wheezing. 11/09/17   [provider]  beclomethasone (QVAR) 80 MCG/ACT inhaler Inhale 2 puffs into the lungs 2 (two) times daily. 06/05/17 06/05/18  Laverle Hobby, MD  dicyclomine (BENTYL) 10 MG capsule Take 10 mg by mouth 3 (three) times daily as needed (abdominal pain).  12/29/17   [provider]  hyoscyamine (LEVSIN SL) 0.125 MG SL tablet Place 1 tablet under the tongue every 6 (six) hours as needed for diarrhea or loose stools. 02/13/18   [provider]  nitroGLYCERIN (NITROSTAT) 0.4 MG SL tablet Place 1 tablet (0.4 mg total) under the tongue every 5 (five) minutes as needed for chest pain. 01/24/17   Henreitta Leber, MD  prochlorperazine (COMPAZINE) 10 MG tablet Take 1 tablet (10 mg total) by mouth every 6 (six) hours as needed for nausea or vomiting. Patient not taking: Reported on 01/29/2018 01/16/18   Loletha Grayer, MD  traMADol (ULTRAM) 50 MG tablet Take 1 tablet (50 mg total) by mouth every 6 (six) hours as needed for moderate pain or severe pain. 02/01/18   Saundra Shelling, MD    Allergies Contrast media [iodinated diagnostic agents]; Metrizamide; Penicillins; Isosorbide nitrate; Latex; Ondansetron; Zofran [ondansetron hcl]; Povidone-iodine; and Pulmicort  [budesonide]  Family History  Problem Relation Age of Onset  . Cancer Mother 30       ovarian  . CAD Mother   . Cancer Father 80       brain  . CAD Father   . Cancer Daughter 18       skin  . Cancer Maternal Aunt 66       breast    Social History Social History   Tobacco Use  . Smoking status: Never Smoker  . Smokeless tobacco: Never Used  Substance Use Topics  . Alcohol use: No  . Drug use: No    Review of Systems  Constitutional: No fever. Eyes: No redness. ENT: No sore throat. Cardiovascular: Denies chest pain. Respiratory: Denies shortness of breath. Gastrointestinal: Positive for vomiting.  Genitourinary: Negative for dysuria.  Musculoskeletal: Negative for back pain. Skin: Negative for rash. Neurological: Negative for headache.   ____________________________________________   PHYSICAL EXAM:  VITAL SIGNS: ED Triage Vitals  Enc Vitals Group     BP 02/20/18 1733 109/69  Pulse Rate 02/20/18 1733 99     Resp 02/20/18 1733 20     Temp 02/20/18 1733 98.8 F (37.1 C)     Temp Source 02/20/18 1733 Oral     SpO2 02/20/18 1733 98 %     Weight 02/20/18 1734 220 lb (99.8 kg)     Height 02/20/18 1734 5\' 5"  (1.651 m)     Head Circumference --      Peak Flow --      Pain Score 02/20/18 1734 9     Pain Loc --      Pain Edu? --      Excl. in [place]? --     Constitutional: Alert and oriented. Uncomfortable appearing but in no acute distress. Eyes: Conjunctivae are normal.  No scleral icterus. Head: Atraumatic. Nose: No congestion/rhinnorhea. Mouth/Throat: Mucous membranes are slightly dry.   Neck: Normal range of motion.  Cardiovascular: Normal rate, regular rhythm. Grossly normal heart sounds.  Good peripheral circulation. Respiratory: Normal respiratory effort.  No retractions. Lungs CTAB. Gastrointestinal: Soft with mild diffuse tenderness, worse in the epigastric area. No distention.  Genitourinary: No flank or CVA tenderness. Musculoskeletal: No  lower extremity edema.  Extremities warm and well perfused.  Neurologic:  Normal speech and language. No gross focal neurologic deficits are appreciated.  Skin:  Skin is warm and dry. No rash noted. Psychiatric: Mood and affect are normal. Speech and behavior are normal.  ____________________________________________   LABS (all labs ordered are listed, but only abnormal results are displayed)  Labs Reviewed  COMPREHENSIVE METABOLIC PANEL - Abnormal; Notable for the following components:      Result Value   Potassium 2.5 (*)    Glucose, Bld 129 (*)    Total Bilirubin 0.2 (*)    All other components within normal limits  CBC - Abnormal; Notable for the following components:   MCV 79.7 (*)    RDW 16.4 (*)    All other components within normal limits  URINALYSIS, COMPLETE (UACMP) WITH MICROSCOPIC - Abnormal; Notable for the following components:   Color, Urine YELLOW (*)    APPearance CLEAR (*)    Bacteria, UA RARE (*)    All other components within normal limits  BASIC METABOLIC PANEL - Abnormal; Notable for the following components:   Potassium 3.1 (*)    Calcium 8.4 (*)    All other components within normal limits  CBC - Abnormal; Notable for the following components:   Hemoglobin 10.6 (*)    HCT 31.6 (*)    RDW 16.5 (*)    All other components within normal limits  BASIC METABOLIC PANEL - Abnormal; Notable for the following components:   Chloride 113 (*)    Calcium 8.6 (*)    All other components within normal limits  MRSA PCR SCREENING  LIPASE, BLOOD  MAGNESIUM  SURGICAL PATHOLOGY   ____________________________________________  EKG  ED ECG REPORT I, Arta Silence, the attending physician, personally viewed and interpreted this ECG.  Date: 02/20/2018 EKG Time: 1731 Rate: 106 Rhythm: Sinus tachycardia QRS Axis: Left axis Intervals: normal ST/T Wave abnormalities: Nonspecific T wave flattening Narrative Interpretation: Nonspecific abnormalities, low  voltage  ____________________________________________  RADIOLOGY    ____________________________________________   PROCEDURES  Procedure(s) performed: No  Procedures  Critical Care performed: No ____________________________________________   INITIAL IMPRESSION / ASSESSMENT AND PLAN / ED COURSE  Pertinent labs & imaging results that were available during my care of the patient were reviewed by me and considered in my medical decision  making (see chart for details).  52 year old female with PMH as noted above presents with crampy abdominal pain, acute on chronic.  Patient states she has been having similar pain over the last month but is having an acutely worsened episode in the last 2 days, associated with vomiting.  She has had chronic diarrhea for which she has been worked up and has had negative C. difficile studies.  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  I reviewed the past medical records in Epic and confirmed that the patient has been evaluated by gastroenterology.  She had a CT abdomen about 1 month ago which was negative for acute findings.  On exam currently, the vital signs are normal, the patient is uncomfortable but not ill appearing, and the exam is as described above with some mild epigastric and lower abdominal tenderness but no focal findings or peritoneal signs.  Overall presentation is consistent with exacerbation of patient's chronic abdominal pain/cramping, possibly related to her colectomy; differential also includes chronic inflammatory processes.  Based on the patient's current presentation, I have low suspicion for surgical etiology such as abscess, acute diverticulitis, or obstruction.  Plan: Symptomatic treatment, IV fluids, labs, UA, and reassess.  I would like to avoid imaging this patient if possible given that she has had multiple CT scans recently, however if she has persistent severe symptoms or concerning lab findings we may consider  imaging.  ----------------------------------------- 11:31 PM on 02/20/2018 -----------------------------------------  Work-up revealed hypokalemia.  On reassessment, the patient still had relatively significant pain and had persistent nausea.  Given that she is having persistent pain and not really tolerating p.o., she likely would have difficulty taking oral potassium repletion.  Given this, I will admit the patient for potassium repletion as well as treatment of her persistent pain.  I signed the patient out to the hospitalist Dr. Jannifer Franklin. ____________________________________________   FINAL CLINICAL IMPRESSION(S) / ED DIAGNOSES  Final diagnoses:  Hypokalemia  Epigastric pain      NEW MEDICATIONS STARTED DURING THIS VISIT:  Discharge Medication List as of 02/23/2018 11:01 AM       Note:  This document was prepared using Dragon voice recognition software and may include unintentional dictation errors.    Arta Silence, MD 02/20/18 4481    Arta Silence, MD 03/15/18 [phone]

## 2018-02-20 NOTE — ED Triage Notes (Signed)
Pt c/o mid upper abdominal pain and RLQ abdominal pain x 2 days. Pt states Dr. Tiffany Kocher was going to do upper and lower GI studies however called today at approx 1600 and told to come here for pain control. Pt also c/o N/V with the pain. Pt states hx of diverticulitis.

## 2018-02-20 NOTE — ED Notes (Signed)
Transport to floor room 223.AS 

## 2018-02-21 LAB — BASIC METABOLIC PANEL
ANION GAP: 7 (ref 5–15)
BUN: 13 mg/dL (ref 6–20)
CHLORIDE: 106 mmol/L (ref 101–111)
CO2: 28 mmol/L (ref 22–32)
Calcium: 8.4 mg/dL — ABNORMAL LOW (ref 8.9–10.3)
Creatinine, Ser: 0.71 mg/dL (ref 0.44–1.00)
GFR calc Af Amer: >60 mL/min (ref >60)
GFR calc non Af Amer: >60 mL/min (ref >60)
Glucose, Bld: 85 mg/dL (ref 65–99)
POTASSIUM: 3.1 mmol/L — AB (ref 3.5–5.1)
SODIUM: 141 mmol/L (ref 135–145)

## 2018-02-21 LAB — URINALYSIS, COMPLETE (UACMP) WITH MICROSCOPIC
BILIRUBIN URINE: NEGATIVE
GLUCOSE, UA: NEGATIVE mg/dL
HGB URINE DIPSTICK: NEGATIVE
KETONES UR: NEGATIVE mg/dL
LEUKOCYTES UA: NEGATIVE
Nitrite: NEGATIVE
PH: 5 (ref 5.0–8.0)
Protein, ur: NEGATIVE mg/dL
Specific Gravity, Urine: 1.016 (ref 1.005–1.030)

## 2018-02-21 LAB — CBC
HEMATOCRIT: 31.6 % — AB (ref 35.0–47.0)
Hemoglobin: 10.6 g/dL — ABNORMAL LOW (ref 12.0–16.0)
MCH: 27.1 pg (ref 26.0–34.0)
MCHC: 33.6 g/dL (ref 32.0–36.0)
MCV: 80.5 fL (ref 80.0–100.0)
Platelets: 236 10*3/uL (ref 150–440)
RBC: 3.93 MIL/uL (ref 3.80–5.20)
RDW: 16.5 % — ABNORMAL HIGH (ref 11.5–14.5)
WBC: 6 10*3/uL (ref 3.6–11.0)

## 2018-02-21 LAB — MRSA PCR SCREENING: MRSA BY PCR: NEGATIVE

## 2018-02-21 LAB — MAGNESIUM: Magnesium: 2.4 mg/dL (ref 1.7–2.4)

## 2018-02-21 MED ORDER — ADULT MULTIVITAMIN W/MINERALS CH
1.0000 | ORAL_TABLET | Freq: Every day | ORAL | Status: DC
  Administered 2018-02-22 – 2018-02-23 (×2): 1 via ORAL
  Filled 2018-02-21 (×2): qty 1

## 2018-02-21 MED ORDER — RISAQUAD PO CAPS
1.0000 | ORAL_CAPSULE | Freq: Every day | ORAL | Status: DC
  Administered 2018-02-21 – 2018-02-23 (×3): 1 via ORAL
  Filled 2018-02-21 (×3): qty 1

## 2018-02-21 MED ORDER — BOOST / RESOURCE BREEZE PO LIQD CUSTOM
1.0000 | Freq: Three times a day (TID) | ORAL | Status: DC
  Administered 2018-02-21 – 2018-02-23 (×5): 1 via ORAL

## 2018-02-21 MED ORDER — BUDESONIDE 0.5 MG/2ML IN SUSP
0.5000 mg | Freq: Two times a day (BID) | RESPIRATORY_TRACT | Status: DC
  Administered 2018-02-21 – 2018-02-23 (×5): 0.5 mg via RESPIRATORY_TRACT
  Filled 2018-02-21 (×5): qty 2

## 2018-02-21 MED ORDER — PEG 3350-KCL-NA BICARB-NACL 420 G PO SOLR
4000.0000 mL | Freq: Once | ORAL | Status: AC
  Administered 2018-02-21: 4000 mL via ORAL
  Filled 2018-02-21: qty 4000

## 2018-02-21 MED ORDER — HYDROMORPHONE HCL 1 MG/ML IJ SOLN
0.5000 mg | Freq: Four times a day (QID) | INTRAMUSCULAR | Status: DC | PRN
  Administered 2018-02-21 – 2018-02-22 (×4): 0.5 mg via INTRAVENOUS
  Filled 2018-02-21 (×4): qty 0.5

## 2018-02-21 MED ORDER — POTASSIUM CHLORIDE CRYS ER 20 MEQ PO TBCR
40.0000 meq | EXTENDED_RELEASE_TABLET | ORAL | Status: AC
  Administered 2018-02-21 (×2): 40 meq via ORAL
  Filled 2018-02-21 (×2): qty 2

## 2018-02-21 NOTE — Progress Notes (Signed)
[place] [place] NAME: Brittany Nguyen    MR#:  [id number]  DATE OF BIRTH:  Jan 28, 1966  SUBJECTIVE:  CHIEF COMPLAINT:   Chief Complaint  Patient presents with  . Abdominal Pain   Still epigastric abdominal pain which is constant and 8 out of 10.  Nausea and diarrhea twice but no vomiting. REVIEW OF SYSTEMS:  Review of Systems  Constitutional: Negative for chills, fever and malaise/fatigue.  HENT: Negative for sore throat.   Eyes: Negative for blurred vision and double vision.  Respiratory: Negative for cough, hemoptysis, shortness of breath, wheezing and stridor.   Cardiovascular: Negative for chest pain, palpitations, orthopnea and leg swelling.  Gastrointestinal: Positive for abdominal pain, diarrhea and nausea. Negative for blood in stool, melena and vomiting.  Genitourinary: Negative for dysuria, flank pain and hematuria.  Musculoskeletal: Negative for back pain and joint pain.  Skin: Negative for rash.  Neurological: Negative for dizziness, sensory change, focal weakness, seizures, loss of consciousness, weakness and headaches.  Endo/Heme/Allergies: Negative for polydipsia.  Psychiatric/Behavioral: Negative for depression. The patient is not nervous/anxious.     DRUG ALLERGIES:   Allergies  Allergen Reactions  . Contrast Media [Iodinated Diagnostic Agents] Shortness Of Breath    Per CT report in 2014 pt had break through contrast reaction with hives and SOB following 13 hour prep. MSY   . Metrizamide Shortness Of Breath  . Penicillins Hives and Other (See Comments)    Other reaction(s): Other (See Comments) Has patient had a PCN reaction causing immediate rash, facial/tongue/throat swelling, SOB or lightheadedness with hypotension: Yes Has patient had a PCN reaction causing severe rash involving mucus membranes or skin necrosis: No Has patient had a PCN reaction that required hospitalization No Has patient had a PCN  reaction occurring within the last 10 years: No If all of the above answers are "NO", then may proceed with Cephalosporin use. Has patient had a PCN reaction causing immediate rash, facial/tongue/throat swelling, SOB or lightheadedness with hypotension: Yes Has patient had a PCN reaction causing severe rash involving mucus membranes or skin necrosis: No Has patient had a PCN reaction that required hospitalization No Has patient had a PCN reaction occurring within the last 10 years: No If all of the above answers are "NO", then may proceed with Cephalosporin use.   . Isosorbide Nitrate     Other reaction(s): Headache  . Latex Hives  . Ondansetron Other (See Comments)    Severe headache  . Zofran [Ondansetron Hcl] Other (See Comments)    Causes migraines  . Povidone-Iodine Rash    Blistering rash  . Pulmicort [Budesonide] Itching   VITALS:  Blood pressure 116/69, pulse 80, temperature 98.1 F (36.7 C), temperature source Oral, resp. rate 16, height 5\' 5"  (1.651 m), weight 220 lb 3.8 oz (99.9 kg), last menstrual period 01/22/2012, SpO2 99 %. PHYSICAL EXAMINATION:  Physical Exam  Constitutional: She is oriented to person, place, and time. She appears well-developed.  HENT:  Head: Normocephalic.  Mouth/Throat: Oropharynx is clear and moist.  Eyes: Pupils are equal, round, and reactive to light. Conjunctivae and EOM are normal. No scleral icterus.  Neck: Normal range of motion. Neck supple. No JVD present. No tracheal deviation present.  Cardiovascular: Normal rate, regular rhythm and normal heart sounds. Exam reveals no gallop.  No murmur heard. Pulmonary/Chest: Effort normal and breath sounds normal. No respiratory distress. She has no wheezes. She has no rales.  Abdominal: Soft. Bowel sounds are normal.  She exhibits no distension. There is tenderness. There is no rebound.  Musculoskeletal: Normal range of motion. She exhibits no edema or tenderness.  Neurological: She is alert and  oriented to person, place, and time. No cranial nerve deficit.  Skin: No rash noted. No erythema.  Psychiatric: She has a normal mood and affect.   LABORATORY PANEL:  Female CBC Recent Labs  Lab 02/21/18 0534  WBC 6.0  HGB 10.6*  HCT 31.6*  PLT 236   ------------------------------------------------------------------------------------------------------------------ Chemistries  Recent Labs  Lab 02/20/18 1731 02/21/18 0534  NA 140 141  K 2.5* 3.1*  CL 101 106  CO2 28 28  GLUCOSE 129* 85  BUN 15 13  CREATININE 0.85 0.71  CALCIUM 9.1 8.4*  MG  --  2.4  AST 22  --   ALT 18  --   ALKPHOS 117  --   BILITOT 0.2*  --    RADIOLOGY:  No results found. ASSESSMENT AND PLAN:   Abdominal pain with nausea, continue and diarrhea. Pain control, clear liquid diet and IV fluid support. Per GI consult, EGD and colonoscopy tomorrow to rule out PUD and microscopic colitis.  Avoid NSAIDs.    Hypokalemia -due to her diarrhea and vomiting.  Given potassium.    Chronic systolic heart failure (HCC) -continue home meds   COPD (chronic obstructive pulmonary disease) ([place]) -home dose inhalers   GERD -home dose PPI  All the records are reviewed and case discussed with Care Management/Social Worker. Management plans discussed with the patient, family and they are in agreement.  CODE STATUS: Full Code  TOTAL TIME TAKING CARE OF THIS PATIENT: 32 minutes.   More than 50% of the time was spent in counseling/coordination of care: YES  POSSIBLE D/C IN 1 DAYS, DEPENDING ON CLINICAL CONDITION.   [name] M.D on 02/21/2018 at 3:52 PM  Between 7am to 6pm - Pager - [phone]  After 6pm go to [email or web address] - [facility]

## 2018-02-21 NOTE — H&P (View-Only) (Signed)
GI Inpatient Consult Note  Reason for Consult: Epigastric abdominal pain   Attending Requesting Consult: Dr. [name], MD  History of Present Illness:  Brittany Nguyen is a 52 y.o. female seen for evaluation of 3-day history of epigastric abdominal pain at the request of Dr. [name], MD. Patient reports 3-day history of 9/10 epigastric pain that she describes as sharp, aching pain. Pain does not radiate but she reports "the pain feels like it's cutting all the way through to my back." She has also been having associated nausea, vomiting, and diarrhea. She has a recent hx of C. Diff, completed Fidaxomicin x 10 days 05/09-5/19, and was found to be c diff negative and GI panel neg on admission. She vomited three times on Monday. Pt is unsure if food seems to make her symptoms worse. Her appetite has been depressed over the past few weeks. Patient does report seeing some tarry appearing stools over the past several days. No bright red blood. She denies dysphagia, odynophagia, GERD exacerbations, hematochezia.   She also endorses 8/10 bilateral lower abdominal pain that has been an issue over the past month. Pain is described as cramping in nature, no alleviating factors. She has tried dicyclomine without benefit. Was started on Levsin at last OV with Mrs. Woodard. Previously was iron-deficient, had been taking oral iron daily up until c diff infection. She does endorse taking frequent ibruprofen, sometimes a couple times a week for the past year. She currently takes Protonix 40 mg twice daily. No family hx of colon cancer, polyps, or IBD. Patient has had part of her colon removed s/p diverticulitis complications in 3419. She has had a cholecystectomy, appendectomy in the past. She is scheduled to have an EGD/Colonoscopy on 07/03 with Dr. Alice Reichert. Pt last saw Laurine Blazer, PA-C at [facility] on 06/11. Pt has recent labs showing elevated fecal calprotectin with concern for possible IBD overlap. Pt  had positive occult blood in 2018.  She is also being monitored for thyroid nodules and is scheduled for thyroidectomy July 8. Husband accompanies and helps with history.    Last EGD/Colonoscopy: Sometime before 2010   Past Medical History:  Past Medical History:  Diagnosis Date  . Asthma 2013  . CHF (congestive heart failure) ([place])   . Diverticulitis 2010  . DVT (deep venous thrombosis) ([place])   . Endometriosis 1990  . Heart disease 2013  . Hx MRSA infection   . Lump or mass in breast   . Restless leg     Problem List: Patient Active Problem List   Diagnosis Date Noted  . Abdominal pain 02/20/2018  . Unstable angina ([place])   . Encounter for anticoagulation discussion and counseling   . C. difficile colitis 01/14/2018  . GIB (gastrointestinal bleeding) 01/08/2018  . SOB (shortness of breath) 11/01/2017  . Influenza A 10/29/2017  . Acute respiratory failure ([place]) 09/24/2017  . Sepsis ([place]) 07/03/2017  . COPD (chronic obstructive pulmonary disease) ([place]) 04/26/2017  . Community acquired pneumonia 04/25/2017  . Chronic systolic heart failure ([place]) 02/03/2017  . Hypotension 02/03/2017  . Chest pain 12/09/2016  . RLS (restless legs syndrome) 12/09/2016  . Cough, persistent 11/22/2015  . Hypokalemia 11/22/2015  . Leukocytosis 11/22/2015  . Dyspnea 11/21/2015  . Respiratory distress 09/19/2015  . Breast pain 01/11/2013  . Hx MRSA infection   . Lump or mass in breast   . GOITER, MULTINODULAR 01/20/2009  . HYPOGLYCEMIA, UNSPECIFIED 01/20/2009  . GERD 01/20/2009  . DIVERTICULITIS OF COLON 01/20/2009  Past Surgical History: Past Surgical History:  Procedure Laterality Date  . ABDOMINAL HYSTERECTOMY     age 73  . CARDIAC DEFIBRILLATOR PLACEMENT    . CESAREAN SECTION    . CHOLECYSTECTOMY    . COLECTOMY    . NASAL SINUS SURGERY  2012    Allergies: Allergies  Allergen Reactions  . Contrast Media [Iodinated Diagnostic Agents] Shortness Of Breath    Per CT report in  2014 pt had break through contrast reaction with hives and SOB following 13 hour prep. MSY   . Metrizamide Shortness Of Breath  . Penicillins Hives and Other (See Comments)    Other reaction(s): Other (See Comments) Has patient had a PCN reaction causing immediate rash, facial/tongue/throat swelling, SOB or lightheadedness with hypotension: Yes Has patient had a PCN reaction causing severe rash involving mucus membranes or skin necrosis: No Has patient had a PCN reaction that required hospitalization No Has patient had a PCN reaction occurring within the last 10 years: No If all of the above answers are "NO", then may proceed with Cephalosporin use. Has patient had a PCN reaction causing immediate rash, facial/tongue/throat swelling, SOB or lightheadedness with hypotension: Yes Has patient had a PCN reaction causing severe rash involving mucus membranes or skin necrosis: No Has patient had a PCN reaction that required hospitalization No Has patient had a PCN reaction occurring within the last 10 years: No If all of the above answers are "NO", then may proceed with Cephalosporin use.   . Isosorbide Nitrate     Other reaction(s): Headache  . Latex Hives  . Ondansetron Other (See Comments)    Severe headache  . Zofran [Ondansetron Hcl] Other (See Comments)    Causes migraines  . Povidone-Iodine Rash    Blistering rash  . Pulmicort [Budesonide] Itching    Home Medications: Medications Prior to Admission  Medication Sig Dispense Refill Last Dose  . cyanocobalamin 500 MCG tablet Take 500 mcg by mouth daily.   01/29/2018 at 0900  . ferrous sulfate 325 (65 FE) MG tablet Take 325 mg by mouth daily with breakfast.   01/29/2018 at 0900  . losartan (COZAAR) 25 MG tablet Take 12.5 mg by mouth 2 (two) times daily.   01/29/2018 at 1700  . metoprolol succinate (TOPROL-XL) 100 MG 24 hr tablet Take 1 tablet (100 mg total) by mouth daily. Take with or immediately following a meal. 30 tablet 0 01/29/2018 at  1900  . Multiple Vitamin (MULTIVITAMIN WITH MINERALS) TABS tablet Take 1 tablet by mouth daily.   01/29/2018 at 0900  . pantoprazole (PROTONIX) 40 MG tablet Take 1 tablet (40 mg total) by mouth daily. 30 tablet 1   . rOPINIRole (REQUIP) 2 MG tablet Take 2 tablets (4 mg total) by mouth at bedtime. 2-4  Mg per night (Patient taking differently: Take 4 mg by mouth at bedtime. )   01/29/2018 at 2100  . sertraline (ZOLOFT) 50 MG tablet Take 50 mg by mouth daily.   01/29/2018 at 2100  . spironolactone (ALDACTONE) 50 MG tablet Take 50 mg by mouth daily.   01/29/2018 at 0900  . acetaminophen (TYLENOL) 325 MG tablet Take 2 tablets (650 mg total) by mouth every 6 (six) hours as needed for mild pain (or Fever >/= 101).   prn at prn  . albuterol (ACCUNEB) 1.25 MG/3ML nebulizer solution Take 3 mLs by nebulization every 6 (six) hours as needed for wheezing.  12 prn at prn  . beclomethasone (QVAR) 80 MCG/ACT inhaler Inhale 2  puffs into the lungs 2 (two) times daily. 1 Inhaler 5 prn at prn  . dicyclomine (BENTYL) 10 MG capsule Take 10 mg by mouth 3 (three) times daily as needed (abdominal pain).   3 prn at prn  . hyoscyamine (LEVSIN SL) 0.125 MG SL tablet Place 1 tablet under the tongue every 6 (six) hours as needed for diarrhea or loose stools.  1 prn at prn  . nitroGLYCERIN (NITROSTAT) 0.4 MG SL tablet Place 1 tablet (0.4 mg total) under the tongue every 5 (five) minutes as needed for chest pain. 90 tablet 1 prn at prn  . prochlorperazine (COMPAZINE) 10 MG tablet Take 1 tablet (10 mg total) by mouth every 6 (six) hours as needed for nausea or vomiting. (Patient not taking: Reported on 01/29/2018) 20 tablet 0 Not Taking at Unknown time  . traMADol (ULTRAM) 50 MG tablet Take 1 tablet (50 mg total) by mouth every 6 (six) hours as needed for moderate pain or severe pain. 20 tablet 0 prn at prn   Home medication reconciliation was completed with the patient.   Scheduled Inpatient Medications:   . budesonide (PULMICORT)  nebulizer solution  0.5 mg Nebulization BID  . enoxaparin (LOVENOX) injection  40 mg Subcutaneous Q24H  . pantoprazole  40 mg Oral Daily  . sertraline  50 mg Oral Daily    Continuous Inpatient Infusions:   . sodium chloride 75 mL/hr at 02/21/18 1236    PRN Inpatient Medications:  acetaminophen **OR** acetaminophen, dicyclomine, HYDROmorphone (DILAUDID) injection, oxyCODONE, promethazine  Family History: family history includes CAD in her father and mother; Cancer (age of onset: 56) in her daughter; Cancer (age of onset: 1) in her mother; Cancer (age of onset: 83) in her maternal aunt; Cancer (age of onset: 53) in her father.  The patient's family history is negative for inflammatory bowel disorders, GI malignancy, or solid organ transplantation.  Social History:   reports that she has never smoked. She has never used smokeless tobacco. She reports that she does not drink alcohol or use drugs. The patient denies ETOH, tobacco, or drug use.   Review of Systems: Constitutional: Weight is stable.  Eyes: No changes in vision. ENT: No oral lesions, sore throat.  GI: see HPI.  Heme/Lymph: No easy bruising.  CV: No chest pain.  GU: No hematuria.  Integumentary: No rashes.  Neuro: No headaches.  Psych: No depression/anxiety.  Endocrine: No heat/cold intolerance.  Allergic/Immunologic: No urticaria.  Resp: No cough, SOB.  Musculoskeletal: No joint swelling.    Physical Examination: BP 116/69 (BP Location: Right Arm)   Pulse 80   Temp 98.1 F (36.7 C) (Oral)   Resp 16   Ht 5\' 5"  (1.651 m)   Wt 99.9 kg (220 lb 3.8 oz)   LMP 01/22/2012 (Approximate) Comment: Hysterectomy 5 years ago  SpO2 99%   BMI 36.65 kg/m  Pleasant, obese female in hospital bed. Answers all questions appropriatley. Gen: NAD, alert and oriented x 4 HEENT: PEERLA, EOMI, Neck: supple, no JVD or thyromegaly Chest: CTA bilaterally, no wheezes, crackles, or other adventitious sounds CV: RRR, no m/g/c/r Abd:  soft, moderate epigastric tenderness, some bilateral lower abdominal tenderness, ND, +BS in all four quadrants; no HSM, guarding, ridigity, or rebound tenderness Ext: no edema, well perfused with 2+ pulses, Skin: no rash or lesions noted Lymph: no LAD  Data: Lab Results  Component Value Date   WBC 6.0 02/21/2018   HGB 10.6 (L) 02/21/2018   HCT 31.6 (L) 02/21/2018   MCV  80.5 02/21/2018   PLT 236 02/21/2018   Recent Labs  Lab 02/20/18 1731 02/21/18 0534  HGB 12.0 10.6*   Lab Results  Component Value Date   NA 141 02/21/2018   K 3.1 (L) 02/21/2018   CL 106 02/21/2018   CO2 28 02/21/2018   BUN 13 02/21/2018   CREATININE 0.71 02/21/2018   Lab Results  Component Value Date   ALT 18 02/20/2018   AST 22 02/20/2018   ALKPHOS 117 02/20/2018   BILITOT 0.2 (L) 02/20/2018   No results for input(s): APTT, INR, PTT in the last 168 hours. Assessment/Plan: Ms. Trim is a 52 y.o. female with a PMH of CAD, CHF with EF 25%, GERD, morbid obesity, + pacemaker admitted for 3-day history of epigastric pain with associated nausea and vomiting.  1. Acute epigastric pain with nausea and vomiting/Diarrhea: - Patient's symptoms are concerning for possible PUD +/- H pylori vs gastritis with known use of frequent NSAIDs and questionable episode of melena. - Differential also includes esophagitis, duodenitis, pancreatitis - Hemoglobin 10.6 today (12.0 yesterday); trending down - Plan to move scheduled EGD/Colonoscopy on 07/03 to tomorrow - Clear liquid diet today, pt will start bowel prep around 1700 today - NPO after 0500 tomorrow. Plan for EGD/Colon around 11:30 tomorrow.  - Discussed procedure details, potential risks and complications, including bleeding, infection, small puncture to intestines or esophagus, or problems with anesthesia. Patient aware and verbalizes understanding. Pt consents to proceed. Pt not on iron or anticoagulation. - Advised pt to avoid all NSAIDs in the future - Continue  Protonix 40 mg BID  2. Diarrhea/Abdominal pain: - Elevated fecal calprotectin along with ongoing diarrhea - Plan for colonoscopy tomorrow with potential biopsies to rule out microscopic colitis  Recommendations: - Clear liquid diet today - Start bowel prep at 1700 today - NPO after 0500 tomorrow - Plan for EGD/Colonoscopy with Dr. Alice Reichert tomorrow  Thank you for the consult. Please call with questions or concerns.  Geanie Kenning, PA-C [facility]

## 2018-02-21 NOTE — Progress Notes (Signed)
Per Tammy from infection precaution pt doesn't need to be on enteric precautions. She was positive for noravirus and c-diff in the past, but was negative this admission for c-diff and GI panel.

## 2018-02-21 NOTE — Progress Notes (Addendum)
Initial Nutrition Assessment  DOCUMENTATION CODES:   Obesity unspecified  INTERVENTION:   Pt may benefit from probiotics   Boost Breeze po TID, each supplement provides 250 kcal and 9 grams of protein  MVI daily  NUTRITION DIAGNOSIS:   Inadequate oral intake related to acute illness as evidenced by per patient/family report.  GOAL:   Patient will meet greater than or equal to 90% of their needs  MONITOR:   PO intake, Supplement acceptance, Labs, Weight trends, I & O's  REASON FOR ASSESSMENT:   Malnutrition Screening Tool    ASSESSMENT:   52 y.o. female who presents with 2 days of progressive abdominal discomfort with nausea and vomiting and diarrhea.   Met with pt in room today. RD familiar with this pt from previous admit ~ 6 weeks ago. Pt with chronic intermittent nausea, vomiting and abdominal pain that started ~ 2-3 months ago. Pt reports that her appetite and oral intake has continued to be poor and that she is only eating small amounts of meals. Pt has continued to have intermittent nausea and vomiting and reports having chronic diarrhea. Per chart, pt is weight stable. Pt just initiated on clear liquids today. Pt prefers chocolate supplements. Potassium being supplemented. RD will order supplements and MVI to help pt meet her estimated needs. Pt with h/o C-Diff in May; pt negative this admit. Pt may benefit from probiotics. GI consult today; plan for EGD/colonoscopy tomorrow.   Medications reviewed and include: lovenox, protonix, NaCl _0 /hr  Labs reviewed: K 3.1(L), Ca 8.4(L), Mg 2.4 wnl  NUTRITION - FOCUSED PHYSICAL EXAM:    Most Recent Value  Orbital Region  No depletion  Upper Arm Region  No depletion  Thoracic and Lumbar Region  No depletion  Buccal Region  No depletion  Temple Region  No depletion  Clavicle Bone Region  No depletion  Clavicle and Acromion Bone Region  No depletion  Scapular Bone Region  No depletion  Dorsal Hand  No depletion   Patellar Region  No depletion  Anterior Thigh Region  No depletion  Posterior Calf Region  No depletion  Edema (RD Assessment)  None  Hair  Reviewed  Eyes  Reviewed  Mouth  Reviewed  Skin  Reviewed  Nails  Reviewed     Diet Order:   Diet Order           Diet NPO time specified Except for: Sips with Meds  Diet effective 0500 tomorrow        Diet clear liquid Room service appropriate? Yes; Fluid consistency: Thin  Diet effective now         EDUCATION NEEDS:   Education needs have been addressed  Skin:  Skin Assessment: Reviewed RN Assessment  Last BM:  6/19  Height:   Ht Readings from Last 1 Encounters:  02/20/18 _1  (1.651 m)    Weight:   Wt Readings from Last 1 Encounters:  02/20/18 220 lb 3.8 oz (99.9 kg)    Ideal Body Weight:  56.8 kg  BMI:  Body mass index is 36.65 kg/m.  Estimated Nutritional Needs:   Kcal:  1800-2100kcal/day   Protein:  100-110g/day   Fluid:  >1.7L/day    Koleen Distance MS, RD, LDN Pager #- [phone] Office#- [phone] After Hours Pager: [phone]

## 2018-02-21 NOTE — Consult Note (Signed)
GI Inpatient Consult Note  Reason for Consult: Epigastric abdominal pain   Attending Requesting Consult: Dr. [name], MD  History of Present Illness:  Brittany Nguyen is a 52 y.o. female seen for evaluation of 3-day history of epigastric abdominal pain at the request of Dr. [name], MD. Patient reports 3-day history of 9/10 epigastric pain that she describes as sharp, aching pain. Pain does not radiate but she reports "the pain feels like it's cutting all the way through to my back." She has also been having associated nausea, vomiting, and diarrhea. She has a recent hx of C. Diff, completed Fidaxomicin x 10 days 05/09-5/19, and was found to be c diff negative and GI panel neg on admission. She vomited three times on Monday. Pt is unsure if food seems to make her symptoms worse. Her appetite has been depressed over the past few weeks. Patient does report seeing some tarry appearing stools over the past several days. No bright red blood. She denies dysphagia, odynophagia, GERD exacerbations, hematochezia.   She also endorses 8/10 bilateral lower abdominal pain that has been an issue over the past month. Pain is described as cramping in nature, no alleviating factors. She has tried dicyclomine without benefit. Was started on Levsin at last OV with Mrs. Woodard. Previously was iron-deficient, had been taking oral iron daily up until c diff infection. She does endorse taking frequent ibruprofen, sometimes a couple times a week for the past year. She currently takes Protonix 40 mg twice daily. No family hx of colon cancer, polyps, or IBD. Patient has had part of her colon removed s/p diverticulitis complications in 5638. She has had a cholecystectomy, appendectomy in the past. She is scheduled to have an EGD/Colonoscopy on 07/03 with Dr. Alice Reichert. Pt last saw Laurine Blazer, PA-C at [facility] on 06/11. Pt has recent labs showing elevated fecal calprotectin with concern for possible IBD overlap. Pt  had positive occult blood in 2018.  She is also being monitored for thyroid nodules and is scheduled for thyroidectomy July 8. Husband accompanies and helps with history.    Last EGD/Colonoscopy: Sometime before 2010   Past Medical History:  Past Medical History:  Diagnosis Date  . Asthma 2013  . CHF (congestive heart failure) ([place])   . Diverticulitis 2010  . DVT (deep venous thrombosis) ([place])   . Endometriosis 1990  . Heart disease 2013  . Hx MRSA infection   . Lump or mass in breast   . Restless leg     Problem List: Patient Active Problem List   Diagnosis Date Noted  . Abdominal pain 02/20/2018  . Unstable angina ([place])   . Encounter for anticoagulation discussion and counseling   . C. difficile colitis 01/14/2018  . GIB (gastrointestinal bleeding) 01/08/2018  . SOB (shortness of breath) 11/01/2017  . Influenza A 10/29/2017  . Acute respiratory failure ([place]) 09/24/2017  . Sepsis ([place]) 07/03/2017  . COPD (chronic obstructive pulmonary disease) ([place]) 04/26/2017  . Community acquired pneumonia 04/25/2017  . Chronic systolic heart failure ([place]) 02/03/2017  . Hypotension 02/03/2017  . Chest pain 12/09/2016  . RLS (restless legs syndrome) 12/09/2016  . Cough, persistent 11/22/2015  . Hypokalemia 11/22/2015  . Leukocytosis 11/22/2015  . Dyspnea 11/21/2015  . Respiratory distress 09/19/2015  . Breast pain 01/11/2013  . Hx MRSA infection   . Lump or mass in breast   . GOITER, MULTINODULAR 01/20/2009  . HYPOGLYCEMIA, UNSPECIFIED 01/20/2009  . GERD 01/20/2009  . DIVERTICULITIS OF COLON 01/20/2009  Past Surgical History: Past Surgical History:  Procedure Laterality Date  . ABDOMINAL HYSTERECTOMY     age 25  . CARDIAC DEFIBRILLATOR PLACEMENT    . CESAREAN SECTION    . CHOLECYSTECTOMY    . COLECTOMY    . NASAL SINUS SURGERY  2012    Allergies: Allergies  Allergen Reactions  . Contrast Media [Iodinated Diagnostic Agents] Shortness Of Breath    Per CT report in  2014 pt had break through contrast reaction with hives and SOB following 13 hour prep. MSY   . Metrizamide Shortness Of Breath  . Penicillins Hives and Other (See Comments)    Other reaction(s): Other (See Comments) Has patient had a PCN reaction causing immediate rash, facial/tongue/throat swelling, SOB or lightheadedness with hypotension: Yes Has patient had a PCN reaction causing severe rash involving mucus membranes or skin necrosis: No Has patient had a PCN reaction that required hospitalization No Has patient had a PCN reaction occurring within the last 10 years: No If all of the above answers are "NO", then may proceed with Cephalosporin use. Has patient had a PCN reaction causing immediate rash, facial/tongue/throat swelling, SOB or lightheadedness with hypotension: Yes Has patient had a PCN reaction causing severe rash involving mucus membranes or skin necrosis: No Has patient had a PCN reaction that required hospitalization No Has patient had a PCN reaction occurring within the last 10 years: No If all of the above answers are "NO", then may proceed with Cephalosporin use.   . Isosorbide Nitrate     Other reaction(s): Headache  . Latex Hives  . Ondansetron Other (See Comments)    Severe headache  . Zofran [Ondansetron Hcl] Other (See Comments)    Causes migraines  . Povidone-Iodine Rash    Blistering rash  . Pulmicort [Budesonide] Itching    Home Medications: Medications Prior to Admission  Medication Sig Dispense Refill Last Dose  . cyanocobalamin 500 MCG tablet Take 500 mcg by mouth daily.   01/29/2018 at 0900  . ferrous sulfate 325 (65 FE) MG tablet Take 325 mg by mouth daily with breakfast.   01/29/2018 at 0900  . losartan (COZAAR) 25 MG tablet Take 12.5 mg by mouth 2 (two) times daily.   01/29/2018 at 1700  . metoprolol succinate (TOPROL-XL) 100 MG 24 hr tablet Take 1 tablet (100 mg total) by mouth daily. Take with or immediately following a meal. 30 tablet 0 01/29/2018 at  1900  . Multiple Vitamin (MULTIVITAMIN WITH MINERALS) TABS tablet Take 1 tablet by mouth daily.   01/29/2018 at 0900  . pantoprazole (PROTONIX) 40 MG tablet Take 1 tablet (40 mg total) by mouth daily. 30 tablet 1   . rOPINIRole (REQUIP) 2 MG tablet Take 2 tablets (4 mg total) by mouth at bedtime. 2-4  Mg per night (Patient taking differently: Take 4 mg by mouth at bedtime. )   01/29/2018 at 2100  . sertraline (ZOLOFT) 50 MG tablet Take 50 mg by mouth daily.   01/29/2018 at 2100  . spironolactone (ALDACTONE) 50 MG tablet Take 50 mg by mouth daily.   01/29/2018 at 0900  . acetaminophen (TYLENOL) 325 MG tablet Take 2 tablets (650 mg total) by mouth every 6 (six) hours as needed for mild pain (or Fever >/= 101).   prn at prn  . albuterol (ACCUNEB) 1.25 MG/3ML nebulizer solution Take 3 mLs by nebulization every 6 (six) hours as needed for wheezing.  12 prn at prn  . beclomethasone (QVAR) 80 MCG/ACT inhaler Inhale 2  puffs into the lungs 2 (two) times daily. 1 Inhaler 5 prn at prn  . dicyclomine (BENTYL) 10 MG capsule Take 10 mg by mouth 3 (three) times daily as needed (abdominal pain).   3 prn at prn  . hyoscyamine (LEVSIN SL) 0.125 MG SL tablet Place 1 tablet under the tongue every 6 (six) hours as needed for diarrhea or loose stools.  1 prn at prn  . nitroGLYCERIN (NITROSTAT) 0.4 MG SL tablet Place 1 tablet (0.4 mg total) under the tongue every 5 (five) minutes as needed for chest pain. 90 tablet 1 prn at prn  . prochlorperazine (COMPAZINE) 10 MG tablet Take 1 tablet (10 mg total) by mouth every 6 (six) hours as needed for nausea or vomiting. (Patient not taking: Reported on 01/29/2018) 20 tablet 0 Not Taking at Unknown time  . traMADol (ULTRAM) 50 MG tablet Take 1 tablet (50 mg total) by mouth every 6 (six) hours as needed for moderate pain or severe pain. 20 tablet 0 prn at prn   Home medication reconciliation was completed with the patient.   Scheduled Inpatient Medications:   . budesonide (PULMICORT)  nebulizer solution  0.5 mg Nebulization BID  . enoxaparin (LOVENOX) injection  40 mg Subcutaneous Q24H  . pantoprazole  40 mg Oral Daily  . sertraline  50 mg Oral Daily    Continuous Inpatient Infusions:   . sodium chloride 75 mL/hr at 02/21/18 1236    PRN Inpatient Medications:  acetaminophen **OR** acetaminophen, dicyclomine, HYDROmorphone (DILAUDID) injection, oxyCODONE, promethazine  Family History: family history includes CAD in her father and mother; Cancer (age of onset: 31) in her daughter; Cancer (age of onset: 64) in her mother; Cancer (age of onset: 74) in her maternal aunt; Cancer (age of onset: 54) in her father.  The patient's family history is negative for inflammatory bowel disorders, GI malignancy, or solid organ transplantation.  Social History:   reports that she has never smoked. She has never used smokeless tobacco. She reports that she does not drink alcohol or use drugs. The patient denies ETOH, tobacco, or drug use.   Review of Systems: Constitutional: Weight is stable.  Eyes: No changes in vision. ENT: No oral lesions, sore throat.  GI: see HPI.  Heme/Lymph: No easy bruising.  CV: No chest pain.  GU: No hematuria.  Integumentary: No rashes.  Neuro: No headaches.  Psych: No depression/anxiety.  Endocrine: No heat/cold intolerance.  Allergic/Immunologic: No urticaria.  Resp: No cough, SOB.  Musculoskeletal: No joint swelling.    Physical Examination: BP 116/69 (BP Location: Right Arm)   Pulse 80   Temp 98.1 F (36.7 C) (Oral)   Resp 16   Ht 5\' 5"  (1.651 m)   Wt 99.9 kg (220 lb 3.8 oz)   LMP 01/22/2012 (Approximate) Comment: Hysterectomy 5 years ago  SpO2 99%   BMI 36.65 kg/m  Pleasant, obese female in hospital bed. Answers all questions appropriatley. Gen: NAD, alert and oriented x 4 HEENT: PEERLA, EOMI, Neck: supple, no JVD or thyromegaly Chest: CTA bilaterally, no wheezes, crackles, or other adventitious sounds CV: RRR, no m/g/c/r Abd:  soft, moderate epigastric tenderness, some bilateral lower abdominal tenderness, ND, +BS in all four quadrants; no HSM, guarding, ridigity, or rebound tenderness Ext: no edema, well perfused with 2+ pulses, Skin: no rash or lesions noted Lymph: no LAD  Data: Lab Results  Component Value Date   WBC 6.0 02/21/2018   HGB 10.6 (L) 02/21/2018   HCT 31.6 (L) 02/21/2018   MCV  80.5 02/21/2018   PLT 236 02/21/2018   Recent Labs  Lab 02/20/18 1731 02/21/18 0534  HGB 12.0 10.6*   Lab Results  Component Value Date   NA 141 02/21/2018   K 3.1 (L) 02/21/2018   CL 106 02/21/2018   CO2 28 02/21/2018   BUN 13 02/21/2018   CREATININE 0.71 02/21/2018   Lab Results  Component Value Date   ALT 18 02/20/2018   AST 22 02/20/2018   ALKPHOS 117 02/20/2018   BILITOT 0.2 (L) 02/20/2018   No results for input(s): APTT, INR, PTT in the last 168 hours. Assessment/Plan: Brittany Nguyen is a 52 y.o. female with a PMH of CAD, CHF with EF 25%, GERD, morbid obesity, + pacemaker admitted for 3-day history of epigastric pain with associated nausea and vomiting.  1. Acute epigastric pain with nausea and vomiting/Diarrhea: - Patient's symptoms are concerning for possible PUD +/- H pylori vs gastritis with known use of frequent NSAIDs and questionable episode of melena. - Differential also includes esophagitis, duodenitis, pancreatitis - Hemoglobin 10.6 today (12.0 yesterday); trending down - Plan to move scheduled EGD/Colonoscopy on 07/03 to tomorrow - Clear liquid diet today, pt will start bowel prep around 1700 today - NPO after 0500 tomorrow. Plan for EGD/Colon around 11:30 tomorrow.  - Discussed procedure details, potential risks and complications, including bleeding, infection, small puncture to intestines or esophagus, or problems with anesthesia. Patient aware and verbalizes understanding. Pt consents to proceed. Pt not on iron or anticoagulation. - Advised pt to avoid all NSAIDs in the future - Continue  Protonix 40 mg BID  2. Diarrhea/Abdominal pain: - Elevated fecal calprotectin along with ongoing diarrhea - Plan for colonoscopy tomorrow with potential biopsies to rule out microscopic colitis  Recommendations: - Clear liquid diet today - Start bowel prep at 1700 today - NPO after 0500 tomorrow - Plan for EGD/Colonoscopy with Dr. Alice Reichert tomorrow  Thank you for the consult. Please call with questions or concerns.  Geanie Kenning, PA-C [place]

## 2018-02-22 ENCOUNTER — Observation Stay: Payer: Medicaid Other | Admitting: Anesthesiology

## 2018-02-22 ENCOUNTER — Ambulatory Visit: Admission: RE | Admit: 2018-02-22 | Payer: Medicaid Other | Source: Ambulatory Visit | Admitting: Internal Medicine

## 2018-02-22 ENCOUNTER — Encounter: Payer: Self-pay | Admitting: Anesthesiology

## 2018-02-22 ENCOUNTER — Encounter
Admission: EM | Disposition: A | Discharge status: Home / Self Care | Payer: Self-pay | Source: Home / Self Care | Attending: Emergency Medicine

## 2018-02-22 DIAGNOSIS — D3A092 Benign carcinoid tumor of the stomach: Secondary | ICD-10-CM

## 2018-02-22 HISTORY — PX: ESOPHAGOGASTRODUODENOSCOPY (EGD) WITH PROPOFOL: SHX5813

## 2018-02-22 HISTORY — PX: COLONOSCOPY WITH PROPOFOL: SHX5780

## 2018-02-22 HISTORY — DX: Benign carcinoid tumor of the stomach: D3A.092

## 2018-02-22 LAB — BASIC METABOLIC PANEL
Anion gap: 5 (ref 5–15)
BUN: 8 mg/dL (ref 6–20)
CALCIUM: 8.6 mg/dL — AB (ref 8.9–10.3)
CO2: 25 mmol/L (ref 22–32)
Chloride: 113 mmol/L — ABNORMAL HIGH (ref 101–111)
Creatinine, Ser: 0.84 mg/dL (ref 0.44–1.00)
GFR calc Af Amer: >60 mL/min (ref >60)
Glucose, Bld: 93 mg/dL (ref 65–99)
Potassium: 3.7 mmol/L (ref 3.5–5.1)
Sodium: 143 mmol/L (ref 135–145)

## 2018-02-22 SURGERY — COLONOSCOPY WITH PROPOFOL
Anesthesia: General

## 2018-02-22 MED ORDER — PROPOFOL 10 MG/ML IV BOLUS
INTRAVENOUS | Status: DC | PRN
  Administered 2018-02-22: 70 mg via INTRAVENOUS

## 2018-02-22 MED ORDER — MIDAZOLAM HCL 2 MG/2ML IJ SOLN
INTRAMUSCULAR | Status: DC | PRN
  Administered 2018-02-22 (×2): 1 mg via INTRAVENOUS

## 2018-02-22 MED ORDER — MIDAZOLAM HCL 2 MG/2ML IJ SOLN
INTRAMUSCULAR | Status: AC
  Filled 2018-02-22: qty 2

## 2018-02-22 MED ORDER — PROPOFOL 500 MG/50ML IV EMUL
INTRAVENOUS | Status: DC | PRN
  Administered 2018-02-22: 140 ug/kg/min via INTRAVENOUS

## 2018-02-22 MED ORDER — LIDOCAINE HCL (CARDIAC) PF 100 MG/5ML IV SOSY
PREFILLED_SYRINGE | INTRAVENOUS | Status: DC | PRN
  Administered 2018-02-22: 60 mg via INTRAVENOUS

## 2018-02-22 MED ORDER — GLYCOPYRROLATE 0.2 MG/ML IJ SOLN
INTRAMUSCULAR | Status: DC | PRN
  Administered 2018-02-22: .1 mg via INTRAVENOUS

## 2018-02-22 MED ORDER — SODIUM CHLORIDE 0.9 % IV SOLN: INTRAVENOUS | Status: DC

## 2018-02-22 MED ORDER — PHENYLEPHRINE HCL 10 MG/ML IJ SOLN
INTRAMUSCULAR | Status: DC | PRN
  Administered 2018-02-22 (×2): 100 ug via INTRAVENOUS

## 2018-02-22 NOTE — Op Note (Signed)
[facility]enterology Patient Name: Brittany Nguyen Procedure Date: 02/22/2018 10:21 AM MRN: [id number] Account #: [id number] Date of Birth: 1966-08-02 Admit Type: Inpatient Age: 52 Room: [facility] ENDO ROOM 1 Gender: Female Note Status: Finalized Procedure:            Upper GI endoscopy Indications:          Epigastric abdominal pain, Nausea with vomiting Providers:            Benay Pike. Alice Reichert MD, MD Referring MD:         Irven Easterly. Kary Kos, MD (Referring MD) Medicines:            Propofol per Anesthesia Complications:        No immediate complications. Procedure:            Pre-Anesthesia Assessment:                       - The risks and benefits of the procedure and the                        sedation options and risks were discussed with the                        patient. All questions were answered and informed                        consent was obtained.                       - Patient identification and proposed procedure were                        verified prior to the procedure by the nurse. The                        procedure was verified in the procedure room.                       - ASA Grade Assessment: III - A patient with severe                        systemic disease.                       - After reviewing the risks and benefits, the patient                        was deemed in satisfactory condition to undergo the                        procedure.                       After obtaining informed consent, the endoscope was                        passed under direct vision. Throughout the procedure,                        the patient's blood pressure, pulse, and oxygen  saturations were monitored continuously. The Endoscope                        was introduced through the mouth, and advanced to the                        third part of duodenum. The upper GI endoscopy was                        accomplished without difficulty.  The patient tolerated                        the procedure well. Findings:      The examined esophagus was normal.      Striped mildly erythematous mucosa without bleeding was found in the       gastric antrum. Biopsies were taken with a cold forceps for Helicobacter       pylori testing.      A small, submucosal, non-circumferential mass with no bleeding and no       stigmata of recent bleeding was found in the gastric body. Biopsies were       taken with a cold forceps for histology.      A 2 cm hiatal hernia was present.      A small amount of food (residue) was found in the gastric fundus.      The examined duodenum was normal. Biopsies for histology were taken with       a cold forceps for evaluation of celiac disease. Impression:           - Normal esophagus.                       - Erythematous mucosa in the antrum. Biopsied.                       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 Biopsied.                       - 2 cm hiatal hernia.                       - A small amount of food (residue) in the stomach.                       - Normal examined duodenum. Biopsied. Recommendation:       - Await pathology results.                       - Proceed with colonoscopy Procedure Code(s):    --- Professional ---                       [phone], Esophagogastroduodenoscopy, flexible, transoral;                        with biopsy, single or multiple Diagnosis Code(s):    --- Professional ---                       R11.2, Nausea with vomiting, unspecified                       R10.13, Epigastric  pain                       K44.9, Diaphragmatic hernia without obstruction or                        gangrene                       D13.1, Benign neoplasm of stomach                       K31.89, Other diseases of stomach and duodenum CPT copyright 2017 American Medical Association. All rights reserved. The codes documented in this report are preliminary and upon coder review may  be revised  to meet current compliance requirements. Efrain Sella MD, MD 02/22/2018 11:08:51 AM This report has been signed electronically. Number of Addenda: 0 Note Initiated On: 02/22/2018 10:21 AM      [facility]

## 2018-02-22 NOTE — Transfer of Care (Signed)
Immediate Anesthesia Transfer of Care Note  Patient: Brittany Nguyen  Procedure(s) Performed: COLONOSCOPY WITH PROPOFOL (N/A ) ESOPHAGOGASTRODUODENOSCOPY (EGD) WITH PROPOFOL (N/A )  Patient Location: PACU  Anesthesia Type:General  Level of Consciousness: sedated  Airway & Oxygen Therapy: Patient Spontanous Breathing and Patient connected to nasal cannula oxygen  Post-op Assessment: Report given to RN and Post -op Vital signs reviewed and stable  Post vital signs: Reviewed and stable  Last Vitals:  Vitals Value Taken Time  BP 113/74 02/22/2018 10:55 AM  Temp 36.3 C 02/22/2018 10:55 AM  Pulse 116 02/22/2018 10:55 AM  Resp 20 02/22/2018 10:55 AM  SpO2 99 % 02/22/2018 10:55 AM    Last Pain:  Vitals:   02/22/18 1054  TempSrc: Tympanic  PainSc: 0-No pain      Patients Stated Pain Goal: 0 (18/36/72 5500)  Complications: No apparent anesthesia complications

## 2018-02-22 NOTE — Anesthesia Post-op Follow-up Note (Signed)
Anesthesia QCDR form completed.        

## 2018-02-22 NOTE — Op Note (Signed)
[facility]astroenter[facility] Patient Name: Brittany Nguyen Procedure Date: 02/22/2018 10:20 AM MRN: [id number] Account #: [id number] Date of Birth: 17-Nov-1965 Admit Type: Inpatient Age: 52 Room: [facility] ENDO ROOM 1 Gender: Female Note Status: Finalized Procedure:            Colonoscopy Indications:          Epigastric abdominal pain, Clinically significant                        diarrhea of unexplained origin, Rectal bleeding Providers:            Benay Pike. Alice Reichert MD, MD Referring MD:         Irven Easterly. Kary Kos, MD (Referring MD) Medicines:            Propofol per Anesthesia Complications:        No immediate complications. Procedure:            Pre-Anesthesia Assessment:                       - The risks and benefits of the procedure and the                        sedation options and risks were discussed with the                        patient. All questions were answered and informed                        consent was obtained.                       - Patient identification and proposed procedure were                        verified prior to the procedure by the nurse. The                        procedure was verified in the procedure room.                       - ASA Grade Assessment: II - A patient with mild                        systemic disease.                       - After reviewing the risks and benefits, the patient                        was deemed in satisfactory condition to undergo the                        procedure.                       After obtaining informed consent, the colonoscope was                        passed under direct vision. Throughout the procedure,  the patient's blood pressure, pulse, and oxygen                        saturations were monitored continuously. The                        Colonoscope was introduced through the anus and                        advanced to the the cecum, identified by appendiceal                      orifice and ileocecal valve. The colonoscopy was                        performed without difficulty. The patient tolerated the                        procedure well. The quality of the bowel preparation                        was good. The ileocecal valve, appendiceal orifice, and                        rectum were photographed. Findings:      The perianal and digital rectal examinations were normal. Pertinent       negatives include normal sphincter tone and no palpable rectal lesions.      A few small-mouthed diverticula were found in the sigmoid colon and       distal sigmoid colon.      There was evidence of a prior end-to-end colo-colonic anastomosis in the       sigmoid colon. This was patent and was characterized by healthy       appearing mucosa. The anastomosis was traversed.      The exam was otherwise without abnormality.      Biopsies for histology were taken with a cold forceps from the random       colon for evaluation of microscopic colitis.      Internal hemorrhoids were found during retroflexion. The hemorrhoids       were Grade I (internal hemorrhoids that do not prolapse). Impression:           - Diverticulosis in the sigmoid colon and in the distal                        sigmoid colon.                       - Patent end-to-end colo-colonic anastomosis,                        characterized by healthy appearing mucosa.                       - The examination was otherwise normal.                       - Internal hemorrhoids.                       - Biopsies were taken with a cold forceps from the  random colon for evaluation of microscopic colitis. Recommendation:       - Await pathology results.                       - Resume previous diet.                       - Return patient to hospital ward for ongoing care. Procedure Code(s):    --- Professional ---                       [phone], Colonoscopy, flexible; with biopsy, single  or                        multiple Diagnosis Code(s):    --- Professional ---                       K57.30, Diverticulosis of large intestine without                        perforation or abscess without bleeding                       K62.5, Hemorrhage of anus and rectum                       R19.7, Diarrhea, unspecified                       R10.13, Epigastric pain                       Z98.0, Intestinal bypass and anastomosis status                       K64.0, First degree hemorrhoids CPT copyright 2017 American Medical Association. All rights reserved. The codes documented in this report are preliminary and upon coder review may  be revised to meet current compliance requirements. Efrain Sella MD, MD 02/22/2018 11:10:10 AM This report has been signed electronically. Number of Addenda: 0 Note Initiated On: 02/22/2018 10:20 AM Scope Withdrawal Time: 0 hours 5 minutes 17 seconds  Total Procedure Duration: 0 hours 7 minutes 12 seconds       [facility]

## 2018-02-22 NOTE — Progress Notes (Signed)
Per MD okay for RN to DC discharge order pt will be going home tomorrow.

## 2018-02-22 NOTE — Anesthesia Postprocedure Evaluation (Signed)
Anesthesia Post Note  Patient: Brittany Nguyen  Procedure(s) Performed: COLONOSCOPY WITH PROPOFOL (N/A ) ESOPHAGOGASTRODUODENOSCOPY (EGD) WITH PROPOFOL (N/A )  Patient location during evaluation: Endoscopy Anesthesia Type: General Level of consciousness: awake and alert Pain management: pain level controlled Vital Signs Assessment: post-procedure vital signs reviewed and stable Respiratory status: spontaneous breathing, nonlabored ventilation, respiratory function stable and patient connected to nasal cannula oxygen Cardiovascular status: blood pressure returned to baseline and stable Postop Assessment: no apparent nausea or vomiting Anesthetic complications: no     Last Vitals:  Vitals:   02/22/18 1134 02/22/18 1146  BP: 110/79 103/71  Pulse:  92  Resp:  17  Temp:  36.7 C  SpO2:  99%    Last Pain:  Vitals:   02/22/18 1147  TempSrc:   PainSc: 8                  Kynsli Haapala K Orlie Cundari

## 2018-02-22 NOTE — Progress Notes (Signed)
Per MD okay for RN place full liquids diet and tomorrow morning advance to heart healthy diet, if pt tolerate it.

## 2018-02-22 NOTE — Progress Notes (Signed)
[place] NAME: Brittany Nguyen    MR#:  [id number]  DATE OF BIRTH:  1966-08-02  SUBJECTIVE:  CHIEF COMPLAINT:   Chief Complaint  Patient presents with  . Abdominal Pain   Better epigastric abdominal pain and nausea. REVIEW OF SYSTEMS:  Review of Systems  Constitutional: Negative for chills, fever and malaise/fatigue.  HENT: Negative for sore throat.   Eyes: Negative for blurred vision and double vision.  Respiratory: Negative for cough, hemoptysis, shortness of breath, wheezing and stridor.   Cardiovascular: Negative for chest pain, palpitations, orthopnea and leg swelling.  Gastrointestinal: Positive for abdominal pain, diarrhea and nausea. Negative for blood in stool, melena and vomiting.  Genitourinary: Negative for dysuria, flank pain and hematuria.  Musculoskeletal: Negative for back pain and joint pain.  Skin: Negative for rash.  Neurological: Negative for dizziness, sensory change, focal weakness, seizures, loss of consciousness, weakness and headaches.  Endo/Heme/Allergies: Negative for polydipsia.  Psychiatric/Behavioral: Negative for depression. The patient is not nervous/anxious.     DRUG ALLERGIES:   Allergies  Allergen Reactions  . Contrast Media [Iodinated Diagnostic Agents] Shortness Of Breath    Per CT report in 2014 pt had break through contrast reaction with hives and SOB following 13 hour prep. MSY   . Metrizamide Shortness Of Breath  . Penicillins Hives and Other (See Comments)    Other reaction(s): Other (See Comments) Has patient had a PCN reaction causing immediate rash, facial/tongue/throat swelling, SOB or lightheadedness with hypotension: Yes Has patient had a PCN reaction causing severe rash involving mucus membranes or skin necrosis: No Has patient had a PCN reaction that required hospitalization No Has patient had a PCN reaction occurring within the last 10 years: No If all of the above  answers are "NO", then may proceed with Cephalosporin use. Has patient had a PCN reaction causing immediate rash, facial/tongue/throat swelling, SOB or lightheadedness with hypotension: Yes Has patient had a PCN reaction causing severe rash involving mucus membranes or skin necrosis: No Has patient had a PCN reaction that required hospitalization No Has patient had a PCN reaction occurring within the last 10 years: No If all of the above answers are "NO", then may proceed with Cephalosporin use.   . Isosorbide Nitrate     Other reaction(s): Headache  . Latex Hives  . Ondansetron Other (See Comments)    Severe headache  . Zofran [Ondansetron Hcl] Other (See Comments)    Causes migraines  . Povidone-Iodine Rash    Blistering rash  . Pulmicort [Budesonide] Itching   VITALS:  Blood pressure 103/71, pulse 92, temperature 98.1 F (36.7 C), temperature source Oral, resp. rate 17, height 5\' 5"  (1.651 m), weight 220 lb 3.8 oz (99.9 kg), last menstrual period 01/22/2012, SpO2 99 %. PHYSICAL EXAMINATION:  Physical Exam  Constitutional: She is oriented to person, place, and time. She appears well-developed.  HENT:  Head: Normocephalic.  Mouth/Throat: Oropharynx is clear and moist.  Eyes: Pupils are equal, round, and reactive to light. Conjunctivae and EOM are normal. No scleral icterus.  Neck: Normal range of motion. Neck supple. No JVD present. No tracheal deviation present.  Cardiovascular: Normal rate, regular rhythm and normal heart sounds. Exam reveals no gallop.  No murmur heard. Pulmonary/Chest: Effort normal and breath sounds normal. No respiratory distress. She has no wheezes. She has no rales.  Abdominal: Soft. Bowel sounds are normal. She exhibits no distension. There is tenderness. There is no rebound.  Musculoskeletal: Normal  range of motion. She exhibits no edema or tenderness.  Neurological: She is alert and oriented to person, place, and time. No cranial nerve deficit.  Skin:  No rash noted. No erythema.  Psychiatric: She has a normal mood and affect.   LABORATORY PANEL:  Female CBC Recent Labs  Lab 02/21/18 0534  WBC 6.0  HGB 10.6*  HCT 31.6*  PLT 236   ------------------------------------------------------------------------------------------------------------------ Chemistries  Recent Labs  Lab 02/20/18 1731 02/21/18 0534 02/22/18 0519  NA 140 141 143  K 2.5* 3.1* 3.7  CL 101 106 113*  CO2 28 28 25   GLUCOSE 129* 85 93  BUN 15 13 8   CREATININE 0.85 0.71 0.84  CALCIUM 9.1 8.4* 8.6*  MG  --  2.4  --   AST 22  --   --   ALT 18  --   --   ALKPHOS 117  --   --   BILITOT 0.2*  --   --    RADIOLOGY:  No results found. ASSESSMENT AND PLAN:   Abdominal pain with nausea, continue and diarrhea. Pain control, clear liquid diet and IV fluid support. EGD: Normal esophagus.            Erythematous mucosa in the antrum. Biopsied.            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   Colonoscopy: Diverticulosis.  Avoid NSAIDs. Internal hemorrhoids. Since patient still has nausea and abdominal pain, GI physician suggest monitor the patient overnight.  Hypokalemia -due to her diarrhea and vomiting.  Improved with potassium supplement.    Chronic systolic heart failure (HCC) -continue home meds   COPD (chronic obstructive pulmonary disease) ([place]) -home dose inhalers   GERD -home dose PPI  All the records are reviewed and case discussed with Care Management/Social Worker. Management plans discussed with the patient, family and they are in agreement.  CODE STATUS: Full Code  TOTAL TIME TAKING CARE OF THIS PATIENT: 32 minutes.   More than 50% of the time was spent in counseling/coordination of care: YES  POSSIBLE D/C IN 1 DAYS, DEPENDING ON CLINICAL CONDITION.   [name] M.D on 02/22/2018 at 2:05 PM  Between 7am to 6pm - Pager - [phone]  After 6pm go to [email or web address] - [facility]

## 2018-02-22 NOTE — Anesthesia Preprocedure Evaluation (Signed)
Anesthesia Evaluation  Patient identified by MRN, date of birth, ID band Patient awake    Reviewed: Allergy & Precautions, H&P , NPO status , Patient's Chart, lab work & pertinent test results  History of Anesthesia Complications (+) PONV and history of anesthetic complications  Airway Mallampati: III  TM Distance: <3 FB Neck ROM: limited    Dental  (+) Chipped, Poor Dentition, Missing, Edentulous Upper, Upper Dentures   Pulmonary neg shortness of breath, asthma , sleep apnea and Continuous Positive Airway Pressure Ventilation , pneumonia, COPD,           Cardiovascular Exercise Tolerance: Good (-) angina+CHF  + dysrhythmias Atrial Fibrillation + pacemaker + Cardiac Defibrillator      Neuro/Psych negative neurological ROS  negative psych ROS   GI/Hepatic Neg liver ROS, GERD  Medicated and Controlled,  Endo/Other  negative endocrine ROS  Renal/GU negative Renal ROS  negative genitourinary   Musculoskeletal   Abdominal   Peds  Hematology negative hematology ROS (+)   Anesthesia Other Findings Past Medical History: 2013: Asthma No date: CHF (congestive heart failure) ([place]) 2010: Diverticulitis No date: DVT (deep venous thrombosis) ([place]) 1990: Endometriosis 2013: Heart disease No date: Hx MRSA infection No date: Lump or mass in breast No date: Restless leg  Past Surgical History: No date: ABDOMINAL HYSTERECTOMY     Comment:  age 87 No date: CARDIAC DEFIBRILLATOR PLACEMENT No date: CESAREAN SECTION No date: CHOLECYSTECTOMY No date: COLECTOMY 2012: NASAL SINUS SURGERY  BMI    Body Mass Index:  36.65 kg/m      Reproductive/Obstetrics negative OB ROS                             Anesthesia Physical Anesthesia Plan  ASA: IV  Anesthesia Plan: General   Post-op Pain Management:    Induction: Intravenous  PONV Risk Score and Plan: Propofol infusion and TIVA  Airway  Management Planned: Natural Airway and Nasal Cannula  Additional Equipment:   Intra-op Plan:   Post-operative Plan:   Informed Consent: I have reviewed the patients History and Physical, chart, labs and discussed the procedure including the risks, benefits and alternatives for the proposed anesthesia with the patient or authorized representative who has indicated his/her understanding and acceptance.   Dental Advisory Given  Plan Discussed with: Anesthesiologist, CRNA and Surgeon  Anesthesia Plan Comments: (Patient consented for risks of anesthesia including but not limited to:  - adverse reactions to medications - risk of intubation if required - damage to teeth, lips or other oral mucosa - sore throat or hoarseness - Damage to heart, brain, lungs or loss of life  Patient voiced understanding.)        Anesthesia Quick Evaluation

## 2018-02-22 NOTE — Interval H&P Note (Signed)
History and Physical Interval Note:  02/22/2018 10:10 AM  Brittany Nguyen  has presented today for surgery, with the diagnosis of WT LOSS DIARRHEA EPIG PAIN DYSPHAGIA  The various methods of treatment have been discussed with the patient and family. After consideration of risks, benefits and other options for treatment, the patient has consented to  Procedure(s): COLONOSCOPY WITH PROPOFOL (N/A) ESOPHAGOGASTRODUODENOSCOPY (EGD) WITH PROPOFOL (N/A) as a surgical intervention .  The patient's history has been reviewed, patient examined, no change in status, stable for surgery.  I have reviewed the patient's chart and labs.  Questions were answered to the patient's satisfaction.     [facility], [place]

## 2018-02-23 IMAGING — CR DG CHEST 2V
1 series · 2 of 2 positions shown · non-contrast
Comparison: 08/24/2016

CLINICAL DATA: Frequent cough for 3-4 days

EXAM:
CHEST  2 VIEW

[Series 1: dg chest 2 view · 0.14mm/px · 2 of 2 slices shown]
[im 1/2]
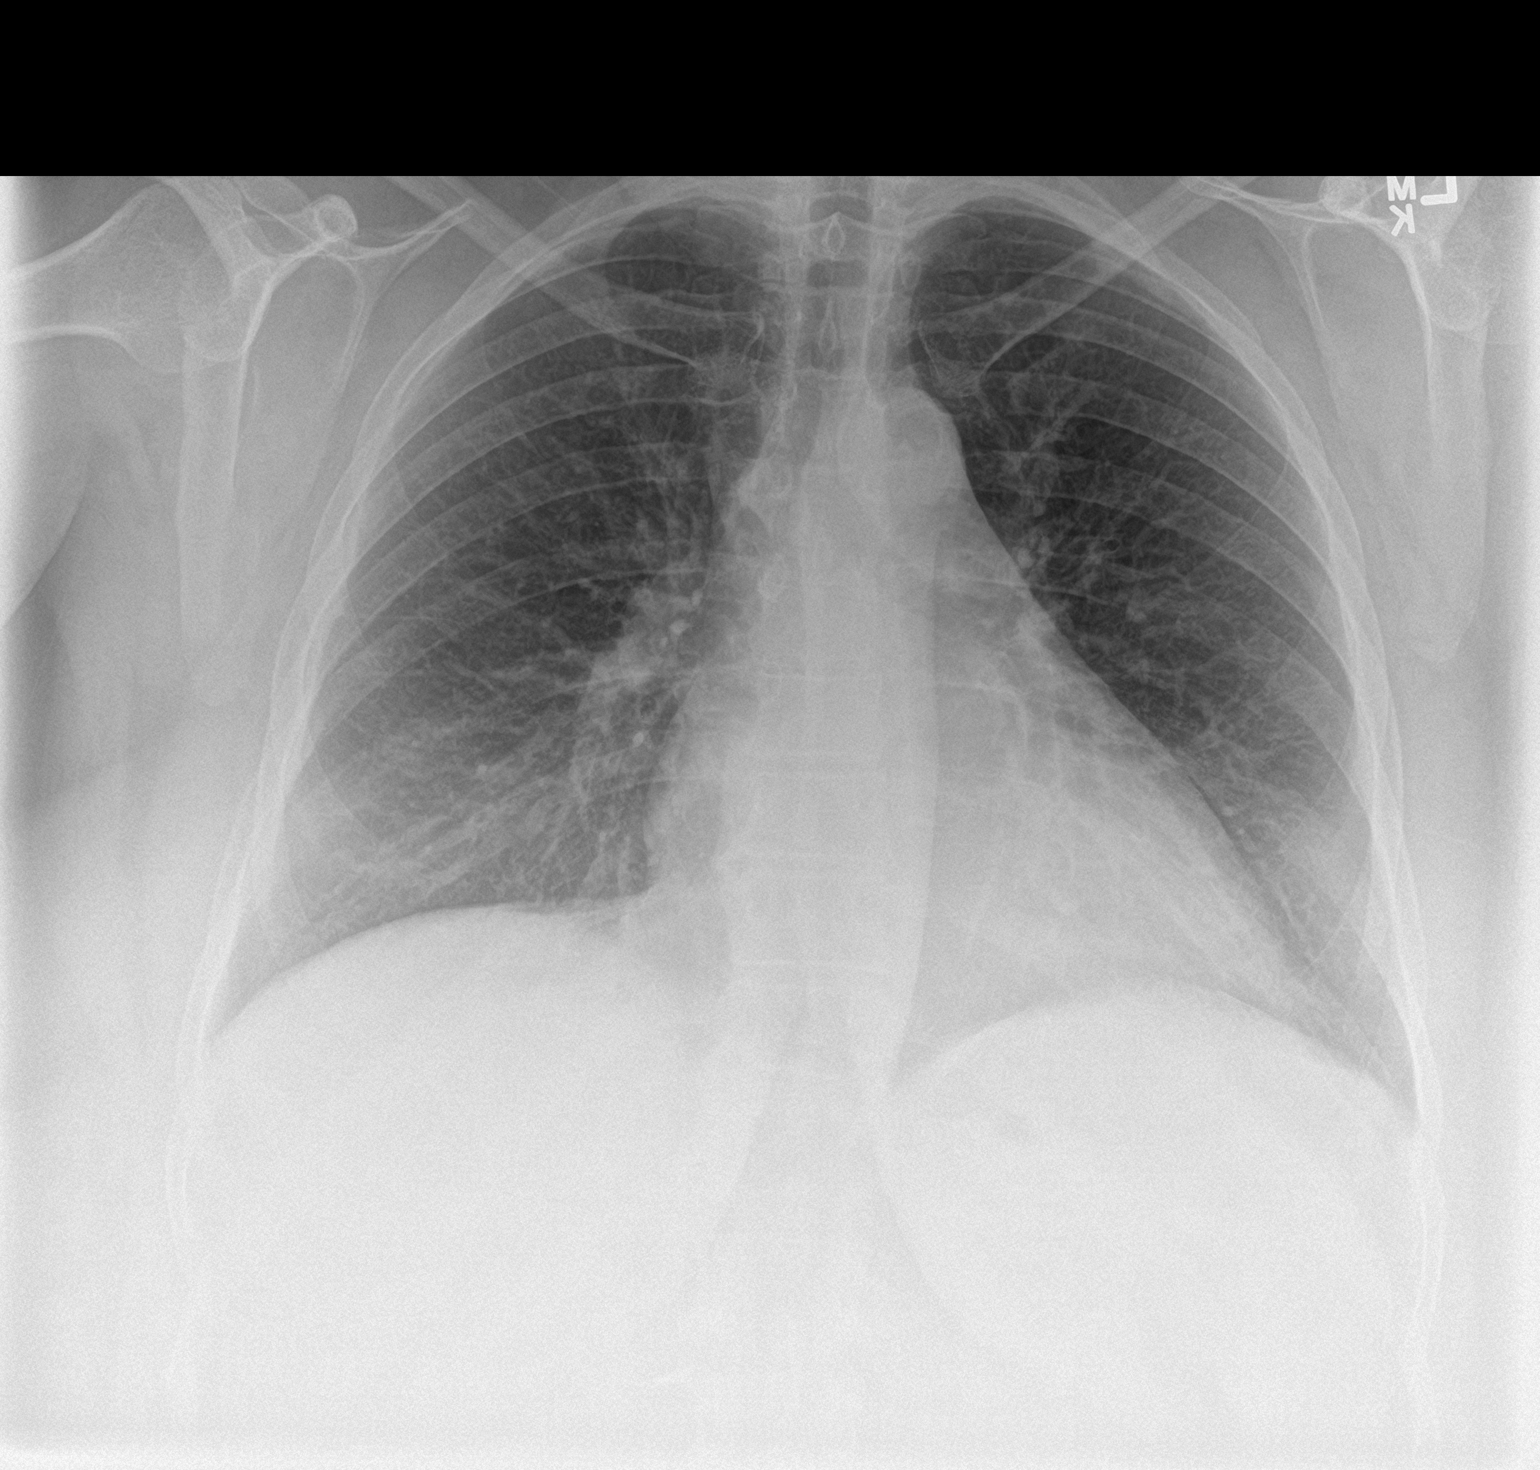
[im 2/2]
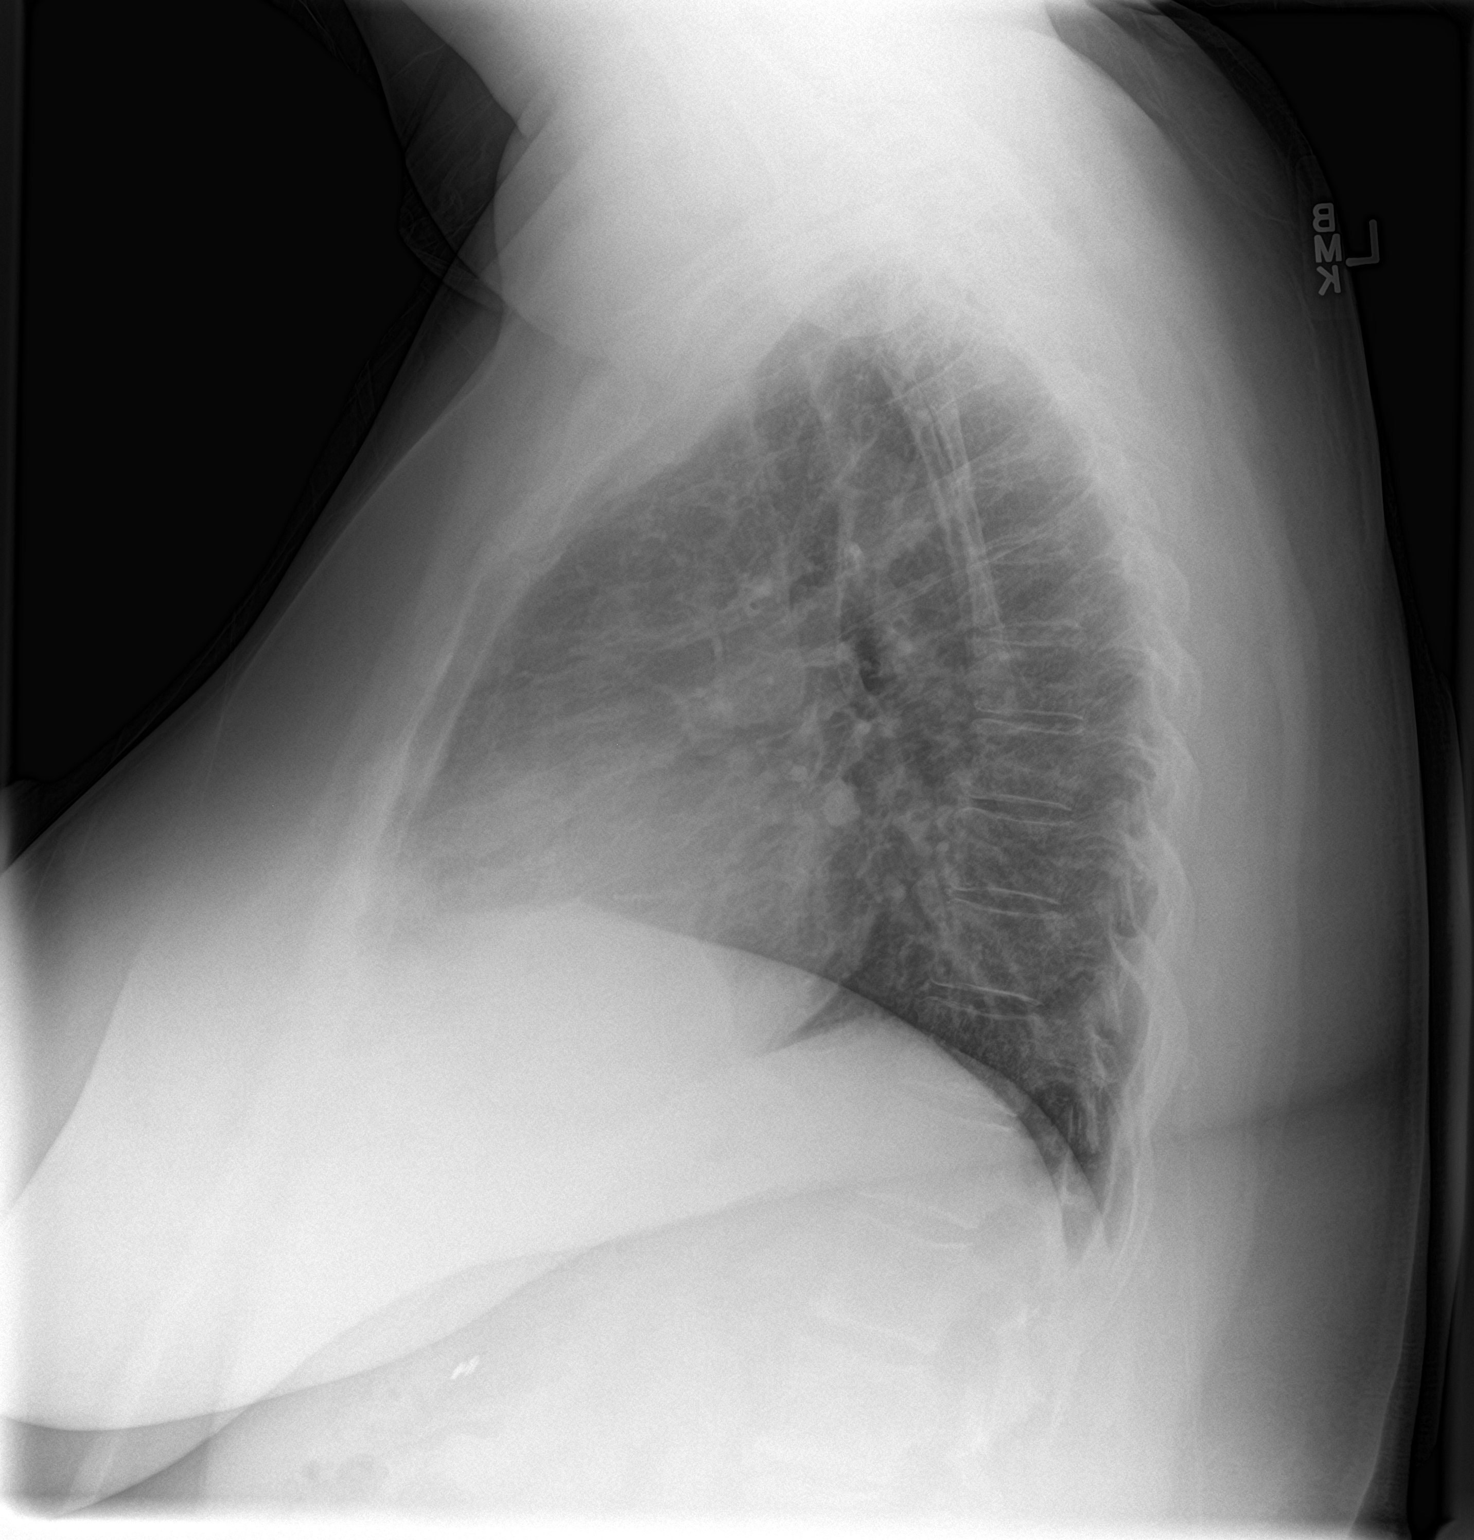

[2 of 2 positions shown; findings below may reference images not displayed]

FINDINGS: The lungs are clear wiithout focal pneumonia, edema, pneumothorax or
pleural effusion. The cardiopericardial silhouette is within normal
limits for size. The visualized bony structures of the thorax are
intact.
IMPRESSION: No active cardiopulmonary disease.

## 2018-02-23 NOTE — Progress Notes (Signed)
Brittany Nguyen  A and O x 4. VSS. Pt tolerating diet well. No complaints of pain or nausea. IV removed intact, no new prescriptions given. Pt voiced understanding of discharge instructions with no further questions. Pt discharged via wheelchair with axillary.    Allergies as of 02/23/2018      Reactions   Contrast Media [iodinated Diagnostic Agents] Shortness Of Breath   Per CT report in 2014 pt had break through contrast reaction with hives and SOB following 13 hour prep. MSY    Metrizamide Shortness Of Breath   Penicillins Hives, Other (See Comments)   Other reaction(s): Other (See Comments) Has patient had a PCN reaction causing immediate rash, facial/tongue/throat swelling, SOB or lightheadedness with hypotension: Yes Has patient had a PCN reaction causing severe rash involving mucus membranes or skin necrosis: No Has patient had a PCN reaction that required hospitalization No Has patient had a PCN reaction occurring within the last 10 years: No If all of the above answers are "NO", then may proceed with Cephalosporin use. Has patient had a PCN reaction causing immediate rash, facial/tongue/throat swelling, SOB or lightheadedness with hypotension: Yes Has patient had a PCN reaction causing severe rash involving mucus membranes or skin necrosis: No Has patient had a PCN reaction that required hospitalization No Has patient had a PCN reaction occurring within the last 10 years: No If all of the above answers are "NO", then may proceed with Cephalosporin use.   Isosorbide Nitrate    Other reaction(s): Headache   Latex Hives   Ondansetron Other (See Comments)   Severe headache   Zofran [ondansetron Hcl] Other (See Comments)   Causes migraines   Povidone-iodine Rash   Blistering rash   Pulmicort [budesonide] Itching      Medication List    TAKE these medications   acetaminophen 325 MG tablet Commonly known as:  TYLENOL Take 2 tablets (650 mg total) by mouth every 6 (six)  hours as needed for mild pain (or Fever >/= 101).   albuterol 1.25 MG/3ML nebulizer solution Commonly known as:  ACCUNEB Take 3 mLs by nebulization every 6 (six) hours as needed for wheezing.   beclomethasone 80 MCG/ACT inhaler Commonly known as:  QVAR Inhale 2 puffs into the lungs 2 (two) times daily.   dicyclomine 10 MG capsule Commonly known as:  BENTYL Take 10 mg by mouth 3 (three) times daily as needed (abdominal pain).   ferrous sulfate 325 (65 FE) MG tablet Take 325 mg by mouth daily with breakfast.   hyoscyamine 0.125 MG SL tablet Commonly known as:  LEVSIN SL Place 1 tablet under the tongue every 6 (six) hours as needed for diarrhea or loose stools.   losartan 25 MG tablet Commonly known as:  COZAAR Take 12.5 mg by mouth 2 (two) times daily.   metoprolol succinate 100 MG 24 hr tablet Commonly known as:  TOPROL-XL Take 1 tablet (100 mg total) by mouth daily. Take with or immediately following a meal.   multivitamin with minerals Tabs tablet Take 1 tablet by mouth daily.   nitroGLYCERIN 0.4 MG SL tablet Commonly known as:  NITROSTAT Place 1 tablet (0.4 mg total) under the tongue every 5 (five) minutes as needed for chest pain.   pantoprazole 40 MG tablet Commonly known as:  PROTONIX Take 1 tablet (40 mg total) by mouth daily.   prochlorperazine 10 MG tablet Commonly known as:  COMPAZINE Take 1 tablet (10 mg total) by mouth every 6 (six) hours as needed for  nausea or vomiting.   rOPINIRole 2 MG tablet Commonly known as:  REQUIP Take 2 tablets (4 mg total) by mouth at bedtime. 2-4  Mg per night What changed:  additional instructions   sertraline 50 MG tablet Commonly known as:  ZOLOFT Take 50 mg by mouth daily.   spironolactone 50 MG tablet Commonly known as:  ALDACTONE Take 50 mg by mouth daily.   traMADol 50 MG tablet Commonly known as:  ULTRAM Take 1 tablet (50 mg total) by mouth every 6 (six) hours as needed for moderate pain or severe pain.    vitamin B-12 500 MCG tablet Commonly known as:  CYANOCOBALAMIN Take 500 mcg by mouth daily.       Vitals:   02/23/18 0506 02/23/18 0730  BP: 121/89   Pulse: 92   Resp: 18   Temp: 98.5 F (36.9 C)   SpO2: 97% 98%    Francesco Sor

## 2018-02-23 NOTE — Discharge Summary (Signed)
[facility] NAME: Brittany Nguyen    MR#:  [id number]  DATE OF BIRTH:  [birth year]  DATE OF ADMISSION:  02/20/49   ADMITTING PHYSICIAN: Lance Coon, MD  DATE OF DISCHARGE: 02/23/49 PRIMARY CARE PHYSICIAN: [name], MD   ADMISSION DIAGNOSIS:  Hypokalemia [E87.6] Epigastric pain [R10.13] DISCHARGE DIAGNOSIS:  Principal Problem:   Abdominal pain Active Problems:   GERD   Hypokalemia   Chronic systolic heart failure (HCC)   COPD (chronic obstructive pulmonary disease) ([place])  SECONDARY DIAGNOSIS:   Past Medical History:  Diagnosis Date  . Asthma 2013  . CHF (congestive heart failure) ([place])   . Diverticulitis 2010  . DVT (deep venous thrombosis) ([place])   . Endometriosis 1990  . Heart disease 2013  . Hx MRSA infection   . Lump or mass in breast   . Restless leg    HOSPITAL COURSE:   Abdominal pain with nausea, continue and diarrhea. The patient has been treated with pain control, clear liquid diet and IV fluid support. EGD: Normal esophagus. Erythematous mucosa in the antrum. Biopsied.   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   Colonoscopy: Diverticulosis.  Avoid NSAIDs. Internal hemorrhoids. Since patient still has nausea and abdominal pain, GI physician suggest monitor the patient overnight. Her symptoms has much improved.  She only has some mild nausea.  Hypokalemia -due to her diarrhea and vomiting.  Improved with potassium supplement.  Chronic systolic heart failure (HCC) -continue home meds COPD (chronic obstructive pulmonary disease) (HCC) -home dose inhalers GERD -home dose PPI DISCHARGE CONDITIONS:  Stable, discharged to home today. CONSULTS OBTAINED:  Treatment Team:  Efrain Sella, MD DRUG ALLERGIES:   Allergies  Allergen Reactions  . Contrast Media [Iodinated Diagnostic Agents] Shortness Of Breath    Per CT report in 2014 pt had break through contrast  reaction with hives and SOB following 13 hour prep. MSY   . Metrizamide Shortness Of Breath  . Penicillins Hives and Other (See Comments)    Other reaction(s): Other (See Comments) Has patient had a PCN reaction causing immediate rash, facial/tongue/throat swelling, SOB or lightheadedness with hypotension: Yes Has patient had a PCN reaction causing severe rash involving mucus membranes or skin necrosis: No Has patient had a PCN reaction that required hospitalization No Has patient had a PCN reaction occurring within the last 10 years: No If all of the above answers are "NO", then may proceed with Cephalosporin use. Has patient had a PCN reaction causing immediate rash, facial/tongue/throat swelling, SOB or lightheadedness with hypotension: Yes Has patient had a PCN reaction causing severe rash involving mucus membranes or skin necrosis: No Has patient had a PCN reaction that required hospitalization No Has patient had a PCN reaction occurring within the last 10 years: No If all of the above answers are "NO", then may proceed with Cephalosporin use.   . Isosorbide Nitrate     Other reaction(s): Headache  . Latex Hives  . Ondansetron Other (See Comments)    Severe headache  . Zofran [Ondansetron Hcl] Other (See Comments)    Causes migraines  . Povidone-Iodine Rash    Blistering rash  . Pulmicort [Budesonide] Itching   DISCHARGE MEDICATIONS:   Allergies as of 02/23/2018      Reactions   Contrast Media [iodinated Diagnostic Agents] Shortness Of Breath   Per CT report in 2014 pt had break through contrast reaction with hives and SOB following 13 hour prep. MSY  Metrizamide Shortness Of Breath   Penicillins Hives, Other (See Comments)   Other reaction(s): Other (See Comments) Has patient had a PCN reaction causing immediate rash, facial/tongue/throat swelling, SOB or lightheadedness with hypotension: Yes Has patient had a PCN reaction causing severe rash involving mucus membranes or  skin necrosis: No Has patient had a PCN reaction that required hospitalization No Has patient had a PCN reaction occurring within the last 10 years: No If all of the above answers are "NO", then may proceed with Cephalosporin use. Has patient had a PCN reaction causing immediate rash, facial/tongue/throat swelling, SOB or lightheadedness with hypotension: Yes Has patient had a PCN reaction causing severe rash involving mucus membranes or skin necrosis: No Has patient had a PCN reaction that required hospitalization No Has patient had a PCN reaction occurring within the last 10 years: No If all of the above answers are "NO", then may proceed with Cephalosporin use.   Isosorbide Nitrate    Other reaction(s): Headache   Latex Hives   Ondansetron Other (See Comments)   Severe headache   Zofran [ondansetron Hcl] Other (See Comments)   Causes migraines   Povidone-iodine Rash   Blistering rash   Pulmicort [budesonide] Itching      Medication List    TAKE these medications   acetaminophen 325 MG tablet Commonly known as:  TYLENOL Take 2 tablets (650 mg total) by mouth every 6 (six) hours as needed for mild pain (or Fever >/= 101).   albuterol 1.25 MG/3ML nebulizer solution Commonly known as:  ACCUNEB Take 3 mLs by nebulization every 6 (six) hours as needed for wheezing.   beclomethasone 80 MCG/ACT inhaler Commonly known as:  QVAR Inhale 2 puffs into the lungs 2 (two) times daily.   dicyclomine 10 MG capsule Commonly known as:  BENTYL Take 10 mg by mouth 3 (three) times daily as needed (abdominal pain).   ferrous sulfate 325 (65 FE) MG tablet Take 325 mg by mouth daily with breakfast.   hyoscyamine 0.125 MG SL tablet Commonly known as:  LEVSIN SL Place 1 tablet under the tongue every 6 (six) hours as needed for diarrhea or loose stools.   losartan 25 MG tablet Commonly known as:  COZAAR Take 12.5 mg by mouth 2 (two) times daily.   metoprolol succinate 100 MG 24 hr  tablet Commonly known as:  TOPROL-XL Take 1 tablet (100 mg total) by mouth daily. Take with or immediately following a meal.   multivitamin with minerals Tabs tablet Take 1 tablet by mouth daily.   nitroGLYCERIN 0.4 MG SL tablet Commonly known as:  NITROSTAT Place 1 tablet (0.4 mg total) under the tongue every 5 (five) minutes as needed for chest pain.   pantoprazole 40 MG tablet Commonly known as:  PROTONIX Take 1 tablet (40 mg total) by mouth daily.   prochlorperazine 10 MG tablet Commonly known as:  COMPAZINE Take 1 tablet (10 mg total) by mouth every 6 (six) hours as needed for nausea or vomiting.   rOPINIRole 2 MG tablet Commonly known as:  REQUIP Take 2 tablets (4 mg total) by mouth at bedtime. 2-4  Mg per night What changed:  additional instructions   sertraline 50 MG tablet Commonly known as:  ZOLOFT Take 50 mg by mouth daily.   spironolactone 50 MG tablet Commonly known as:  ALDACTONE Take 50 mg by mouth daily.   traMADol 50 MG tablet Commonly known as:  ULTRAM Take 1 tablet (50 mg total) by mouth every 6 (six)  hours as needed for moderate pain or severe pain.   vitamin B-12 500 MCG tablet Commonly known as:  CYANOCOBALAMIN Take 500 mcg by mouth daily.        DISCHARGE INSTRUCTIONS:  See AVS.  If you experience worsening of your admission symptoms, develop shortness of breath, life threatening emergency, suicidal or homicidal thoughts you must seek medical attention immediately by calling 911 or calling your MD immediately  if symptoms less severe.  You Must read complete instructions/literature along with all the possible adverse reactions/side effects for all the Medicines you take and that have been prescribed to you. Take any new Medicines after you have completely understood and accpet all the possible adverse reactions/side effects.   Please note  You were cared for by a hospitalist during your hospital stay. If you have any questions about your  discharge medications or the care you received while you were in the hospital after you are discharged, you can call the unit and asked to speak with the hospitalist on call if the hospitalist that took care of you is not available. Once you are discharged, your primary care physician will handle any further medical issues. Please note that NO REFILLS for any discharge medications will be authorized once you are discharged, as it is imperative that you return to your primary care physician (or establish a relationship with a primary care physician if you do not have one) for your aftercare needs so that they can reassess your need for medications and monitor your lab values.    On the day of Discharge:  VITAL SIGNS:  Blood pressure 121/89, pulse 92, temperature 98.5 F (36.9 C), temperature source Oral, resp. rate 18, height 5\' 5"  (1.651 m), weight 220 lb 3.8 oz (99.9 kg), last menstrual period 01/22/2012, SpO2 98 %. PHYSICAL EXAMINATION:  GENERAL:  52 y.o.-year-old patient lying in the bed with no acute distress.  Obesity. EYES: Pupils equal, round, reactive to light and accommodation. No scleral icterus. Extraocular muscles intact.  HEENT: Head atraumatic, normocephalic. Oropharynx and nasopharynx clear.  NECK:  Supple, no jugular venous distention. No thyroid enlargement, no tenderness.  LUNGS: Normal breath sounds bilaterally, no wheezing, rales,rhonchi or crepitation. No use of accessory muscles of respiration.  CARDIOVASCULAR: S1, S2 normal. No murmurs, rubs, or gallops.  ABDOMEN: Soft, non-tender, non-distended. Bowel sounds present. No organomegaly or mass.  EXTREMITIES: No pedal edema, cyanosis, or clubbing.  NEUROLOGIC: Cranial nerves II through XII are intact. Muscle strength 5/5 in all extremities. Sensation intact. Gait not checked.  PSYCHIATRIC: The patient is alert and oriented x 3.  SKIN: No obvious rash, lesion, or ulcer.  DATA REVIEW:   CBC Recent Labs  Lab 02/21/18 0534   WBC 6.0  HGB 10.6*  HCT 31.6*  PLT 236    Chemistries  Recent Labs  Lab 02/20/18 1731 02/21/18 0534 02/22/18 0519  NA 140 141 143  K 2.5* 3.1* 3.7  CL 101 106 113*  CO2 28 28 25   GLUCOSE 129* 85 93  BUN 15 13 8   CREATININE 0.85 0.71 0.84  CALCIUM 9.1 8.4* 8.6*  MG  --  2.4  --   AST 22  --   --   ALT 18  --   --   ALKPHOS 117  --   --   BILITOT 0.2*  --   --      Microbiology Results  Results for orders placed or performed during the hospital encounter of 02/20/18  MRSA PCR Screening  Status: None   Collection Time: 02/21/18  9:15 AM  Result Value Ref Range Status   MRSA by PCR NEGATIVE NEGATIVE Final    Comment:        The GeneXpert MRSA Assay (FDA approved for NASAL specimens only), is one component of a comprehensive MRSA colonization surveillance program. It is not intended to diagnose MRSA infection nor to guide or monitor treatment for MRSA infections. Performed at [facility], [address] [place], [place] [address]     RADIOLOGY:  No results found.   Management plans discussed with the patient, family and they are in agreement.  CODE STATUS: Full Code   TOTAL TIME TAKING CARE OF THIS PATIENT: 25 minutes.    [name] M.D on 02/23/2018 at 1:15 PM  Between 7am to 6pm - Pager - [phone]  After 6pm go to [email or web address] - Proofreader  [facility]  Office  [phone]  CC: Primary care physician; [name], MD   Note: This dictation was prepared with Dragon dictation along with smaller phrase technology. Any transcriptional errors that result from this process are unintentional.

## 2018-02-26 LAB — SURGICAL PATHOLOGY

## 2018-03-19 MED ORDER — HEPARIN SODIUM (PORCINE) 10000 UNIT/ML IJ SOLN
7500.00 | INTRAMUSCULAR | Status: DC
Start: 2018-03-20 — End: 2018-03-19

## 2018-03-19 MED ORDER — GENERIC EXTERNAL MEDICATION
Status: DC
Start: 2018-03-20 — End: 2018-03-19

## 2018-03-19 MED ORDER — GENERIC EXTERNAL MEDICATION
55.00 | Status: DC
Start: ? — End: 2018-03-19

## 2018-03-19 MED ORDER — HEPARIN SODIUM (PORCINE) 5000 UNIT/ML IJ SOLN
5000.00 | INTRAMUSCULAR | Status: DC
Start: 2018-03-22 — End: 2018-03-19

## 2018-03-19 MED ORDER — CALCITRIOL 0.5 MCG PO CAPS
1.00 | ORAL_CAPSULE | ORAL | Status: DC
Start: 2018-03-22 — End: 2018-03-19

## 2018-03-19 MED ORDER — SPIRONOLACTONE 25 MG PO TABS
50.00 | ORAL_TABLET | ORAL | Status: DC
Start: 2018-03-23 — End: 2018-03-19

## 2018-03-19 MED ORDER — BECLOMETHASONE DIPROP HFA 80 MCG/ACT IN AERB
1.00 | INHALATION_SPRAY | RESPIRATORY_TRACT | Status: DC
Start: 2018-03-22 — End: 2018-03-19

## 2018-03-19 MED ORDER — ROPINIROLE HCL 2 MG PO TABS
4.00 | ORAL_TABLET | ORAL | Status: DC
Start: 2018-03-22 — End: 2018-03-19

## 2018-03-19 MED ORDER — ALBUTEROL SULFATE HFA 108 (90 BASE) MCG/ACT IN AERS
2.00 | INHALATION_SPRAY | RESPIRATORY_TRACT | Status: DC
Start: ? — End: 2018-03-19

## 2018-03-19 MED ORDER — GENERIC EXTERNAL MEDICATION
Status: DC
Start: ? — End: 2018-03-19

## 2018-03-19 MED ORDER — PANTOPRAZOLE SODIUM 40 MG PO TBEC
40.00 | DELAYED_RELEASE_TABLET | ORAL | Status: DC
Start: 2018-03-22 — End: 2018-03-19

## 2018-03-19 MED ORDER — LOSARTAN POTASSIUM 25 MG PO TABS
12.50 | ORAL_TABLET | ORAL | Status: DC
Start: 2018-03-22 — End: 2018-03-19

## 2018-03-19 MED ORDER — CETIRIZINE HCL 10 MG PO TABS
10.00 | ORAL_TABLET | ORAL | Status: DC
Start: 2018-03-23 — End: 2018-03-19

## 2018-03-19 MED ORDER — VITAMIN B-12 1000 MCG PO TABS
500.00 | ORAL_TABLET | ORAL | Status: DC
Start: 2018-03-23 — End: 2018-03-19

## 2018-03-19 MED ORDER — FENTANYL CITRATE (PF) 2500 MCG/50ML IJ SOLN
50.00 | INTRAMUSCULAR | Status: DC
Start: ? — End: 2018-03-19

## 2018-03-19 MED ORDER — ACETAMINOPHEN 500 MG PO TABS
1000.00 | ORAL_TABLET | ORAL | Status: DC
Start: ? — End: 2018-03-19

## 2018-03-19 MED ORDER — METOPROLOL SUCCINATE ER 100 MG PO TB24
100.00 | ORAL_TABLET | ORAL | Status: DC
Start: 2018-03-23 — End: 2018-03-19

## 2018-03-19 MED ORDER — GENERIC EXTERNAL MEDICATION
150.00 | Status: DC
Start: 2018-03-23 — End: 2018-03-19

## 2018-03-19 MED ORDER — FUROSEMIDE 40 MG PO TABS
40.00 | ORAL_TABLET | ORAL | Status: DC
Start: 2018-03-22 — End: 2018-03-19

## 2018-03-19 MED ORDER — TRAZODONE HCL 50 MG PO TABS
50.00 | ORAL_TABLET | ORAL | Status: DC
Start: 2018-03-22 — End: 2018-03-19

## 2018-03-19 MED ORDER — LEVOTHYROXINE SODIUM 150 MCG PO TABS
150.00 | ORAL_TABLET | ORAL | Status: DC
Start: 2018-03-23 — End: 2018-03-19

## 2018-03-19 NOTE — Progress Notes (Deleted)
1. Gastrin off PPI  2. Anti IF and parietal cell ab  3. B12,folate 4. Gastric mapping for intestinal metaplasia and if negative for dysplasia repeat Q 3 years + 5. Stool H pylori antigen 6. Small bowel capsule for iron def ( normal stool occult does not rule out an upper GI bleed), in addition capsule study would look for ulcers in jejunum which can be seen in ZE syndrome  7. Fasting serum gastrin should be measured in any patient suspected of having ZES . A serum gastrin value greater than 10 times the upper limit of normal (1000 pg/mL) in the presence of a gastric pH below 2 is diagnostic of ZES 8.Measurement of gastric pH on a single specimen is important to exclude secondary hypergastrinemia due to achlorhydria (eg, atrophic gastritis, pangastritis-associated Helicobacter pylori infections, renal failure, vagotomy, and ingestion of gastric acid antisecretory drugs [eg, PPIs]). In such cases the serum gastrin level can exceed 1000 pg/mL, but the gastric pH is >2. Importantly, these "appropriate" hypergastrinemic conditions are much more common and need to be distinguished from inappropriate causes, such as ZES. 9.  The secretin stimulation test is used to differentiate patients with gastrinomas from other causes of hypergastrinemia (eg, in the setting of gastrin, <10-fold upper limit of normal and gastric pH ?2). Secretin stimulates the release of gastrin by gastrinoma cells, and patients with ZES tumors have a dramatic rise in serum gastrin. In contrast, normal gastric G cells are inhibited by secretin.

## 2018-03-20 ENCOUNTER — Inpatient Hospital Stay: Payer: Medicaid Other | Admitting: Oncology

## 2018-03-23 MED ORDER — DIPHENHYDRAMINE HCL 50 MG PO CAPS
50.00 | ORAL_CAPSULE | ORAL | Status: DC
Start: ? — End: 2018-03-23

## 2018-03-23 MED ORDER — SCOPOLAMINE 1 MG/3DAYS TD PT72
1.00 | MEDICATED_PATCH | TRANSDERMAL | Status: DC
Start: 2018-03-23 — End: 2018-03-23

## 2018-03-23 MED ORDER — GENERIC EXTERNAL MEDICATION
Status: DC
Start: 2018-03-22 — End: 2018-03-23

## 2018-03-23 MED ORDER — GENERIC EXTERNAL MEDICATION
Status: DC
Start: ? — End: 2018-03-23

## 2018-03-23 MED ORDER — PROMETHAZINE HCL 12.5 MG PO TABS
12.50 | ORAL_TABLET | ORAL | Status: DC
Start: ? — End: 2018-03-23

## 2018-03-23 MED ORDER — POTASSIUM CHLORIDE CRYS ER 10 MEQ PO TBCR
20.00 | EXTENDED_RELEASE_TABLET | ORAL | Status: DC
Start: 2018-03-23 — End: 2018-03-23

## 2018-03-26 ENCOUNTER — Encounter: Payer: Self-pay | Admitting: Oncology

## 2018-03-26 ENCOUNTER — Inpatient Hospital Stay: Payer: Medicaid Other

## 2018-03-26 ENCOUNTER — Inpatient Hospital Stay: Payer: Medicaid Other | Attending: Oncology | Admitting: Oncology

## 2018-03-26 VITALS — BP 103/74 | HR 99 | Temp 98.2°F | Resp 18 | Ht 65.0 in | Wt 216.3 lb

## 2018-03-26 DIAGNOSIS — D509 Iron deficiency anemia, unspecified: Secondary | ICD-10-CM

## 2018-03-26 DIAGNOSIS — D649 Anemia, unspecified: Secondary | ICD-10-CM | POA: Insufficient documentation

## 2018-03-26 DIAGNOSIS — Z803 Family history of malignant neoplasm of breast: Secondary | ICD-10-CM | POA: Diagnosis not present

## 2018-03-26 DIAGNOSIS — E164 Increased secretion of gastrin: Secondary | ICD-10-CM | POA: Diagnosis not present

## 2018-03-26 DIAGNOSIS — K2940 Chronic atrophic gastritis without bleeding: Secondary | ICD-10-CM

## 2018-03-26 DIAGNOSIS — K2950 Unspecified chronic gastritis without bleeding: Secondary | ICD-10-CM | POA: Insufficient documentation

## 2018-03-26 DIAGNOSIS — Z808 Family history of malignant neoplasm of other organs or systems: Secondary | ICD-10-CM | POA: Insufficient documentation

## 2018-03-26 DIAGNOSIS — Z8041 Family history of malignant neoplasm of ovary: Secondary | ICD-10-CM | POA: Insufficient documentation

## 2018-03-26 LAB — CBC WITH DIFFERENTIAL/PLATELET
BASOS PCT: 1 %
Basophils Absolute: 0.1 10*3/uL (ref 0–0.1)
EOS ABS: 0.2 10*3/uL (ref 0–0.7)
EOS PCT: 2 %
HCT: 38.5 % (ref 35.0–47.0)
Hemoglobin: 12.5 g/dL (ref 12.0–16.0)
LYMPHS PCT: 20 %
Lymphs Abs: 2.1 10*3/uL (ref 1.0–3.6)
MCH: 25.9 pg — ABNORMAL LOW (ref 26.0–34.0)
MCHC: 32.6 g/dL (ref 32.0–36.0)
MCV: 79.7 fL — AB (ref 80.0–100.0)
MONO ABS: 0.7 10*3/uL (ref 0.2–0.9)
Monocytes Relative: 6 %
Neutro Abs: 7.4 10*3/uL — ABNORMAL HIGH (ref 1.4–6.5)
Neutrophils Relative %: 71 %
PLATELETS: 318 10*3/uL (ref 150–440)
RBC: 4.83 MIL/uL (ref 3.80–5.20)
RDW: 15.9 % — AB (ref 11.5–14.5)
WBC: 10.4 10*3/uL (ref 3.6–11.0)

## 2018-03-26 LAB — VITAMIN B12: Vitamin B-12: 490 pg/mL (ref 180–914)

## 2018-03-26 LAB — FERRITIN: Ferritin: 16 ng/mL (ref 11–307)

## 2018-03-26 LAB — IRON AND TIBC
IRON: 90 ug/dL (ref 28–170)
Saturation Ratios: 20 % (ref 10.4–31.8)
TIBC: 452 ug/dL — ABNORMAL HIGH (ref 250–450)
UIBC: 362 ug/dL

## 2018-03-26 LAB — FOLATE: FOLATE: 54 ng/mL (ref >5.9)

## 2018-03-26 NOTE — Progress Notes (Signed)
Hematology/Oncology Consult note [facility] [phone] [phone]  Patient Care Team: [name], MD as PCP - General (Family Medicine) Corey Skains, MD as Consulting Physician (Cardiology)   Name of the patient: Brittany Nguyen  [id number]  1966-06-24    Reason for referral- elevated gastrin level   Referring physician- Dr. Alice Reichert  Date of visit: 03/26/18   History of presenting illness-patient is a 52 year old female who was seen at [facility] for symptoms of epigastric pain weight loss and diarrhea.  Patient states that she has had chronic diarrhea since her gallbladder was taken out few years ago.Patient underwent EGD on 02/22/2018 which showed normal esophagus.  Mildly erythematous mucosa without bleeding in the gastric antrum.  Small submucosal noncircumferential mass with no bleeding and no recent stigmata of bleeding in the gastric body.  Examined portion of duodenum was normal.  Colonoscopy showed diverticulosis in the sigmoid colon.  End-to-end colocolonic anastomosis with healthy-appearing mucosa.  Internal hemorrhoids.  Patient was also referred to [facility] and underwent EUS of the gastric submucosal mass.  EGD pathology showed reactive duodenitis negative for dysplasia and malignancy in the duodenum.  Evidence of reactive gastritis in the antral mucosa with moderate chronic gastritis and intestinal metaplasia.  Negative for H. pylori dysplasia or malignancy.  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  Colonic mucosa was unremarkable without any evidence of dysplasia and malignancy.  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  Negative for lipoma negative for granulomas.  Patient also had a gastrin level checked  on 02/28/2018 which was elevated at 1698 and hence patient has been referred to [place] for the same.  24-hour 5-HIAA level was normal.  Patient continues to feel fatigued and has ongoing abdominal pain especially on eating.  She has lost about 36 pounds of weight over the last 2 to 3 months she says.  She also recently underwent total thyroidectomy for multinodular goiter   ECOG PS- 1  Pain scale- 7   Review of systems- Review of Systems  Constitutional: Positive for malaise/fatigue and weight loss. Negative for chills and fever.  HENT: Negative for congestion, ear discharge and nosebleeds.   Eyes: Negative for blurred vision.  Respiratory: Negative for cough, hemoptysis, sputum production, shortness of breath and wheezing.   Cardiovascular: Negative for chest pain, palpitations, orthopnea and claudication.  Gastrointestinal: Positive for abdominal pain. Negative for blood in stool, constipation, diarrhea, heartburn, melena, nausea and vomiting.  Genitourinary: Negative for dysuria, flank pain, frequency, hematuria and urgency.  Musculoskeletal: Negative for back pain, joint pain and myalgias.  Skin: Negative for rash.  Neurological: Negative for dizziness, tingling, focal weakness, seizures, weakness and headaches.  Endo/Heme/Allergies: Does not bruise/bleed easily.  Psychiatric/Behavioral: Negative for depression and suicidal ideas. The patient does not have insomnia.     Allergies  Allergen Reactions  . Contrast Media [Iodinated Diagnostic Agents] Shortness Of Breath    Per CT report in 2014 pt had break through contrast reaction with hives and SOB following 13 hour prep. MSY   . Metrizamide Shortness Of Breath  . Penicillins Hives and Other (See Comments)    Other reaction(s): Other (See Comments) Has patient had a PCN reaction causing immediate rash, facial/tongue/throat swelling, SOB or lightheadedness with hypotension: Yes Has patient had a PCN reaction causing severe rash  involving mucus membranes or skin necrosis: No  Has patient had a PCN reaction that required hospitalization No Has patient had a PCN reaction occurring within the last 10 years: No If all of the above answers are "NO", then may proceed with Cephalosporin use. Has patient had a PCN reaction causing immediate rash, facial/tongue/throat swelling, SOB or lightheadedness with hypotension: Yes Has patient had a PCN reaction causing severe rash involving mucus membranes or skin necrosis: No Has patient had a PCN reaction that required hospitalization No Has patient had a PCN reaction occurring within the last 10 years: No If all of the above answers are "NO", then may proceed with Cephalosporin use.   . Isosorbide Nitrate     Other reaction(s): Headache  . Latex Hives  . Ondansetron Other (See Comments)    Severe headache  . Zofran [Ondansetron Hcl] Other (See Comments)    Causes migraines  . Povidone-Iodine Rash    Blistering rash  . Pulmicort [Budesonide] Itching    Patient Active Problem List   Diagnosis Date Noted  . Abdominal pain 02/20/2018  . Unstable angina ([place])   . Encounter for anticoagulation discussion and counseling   . C. difficile colitis 01/14/2018  . GIB (gastrointestinal bleeding) 01/08/2018  . SOB (shortness of breath) 11/01/2017  . Influenza A 10/29/2017  . Acute respiratory failure ([place]) 09/24/2017  . Sepsis ([place]) 07/03/2017  . COPD (chronic obstructive pulmonary disease) ([place]) 04/26/2017  . Community acquired pneumonia 04/25/2017  . Chronic systolic heart failure ([place]) 02/03/2017  . Hypotension 02/03/2017  . Chest pain 12/09/2016  . RLS (restless legs syndrome) 12/09/2016  . Cough, persistent 11/22/2015  . Hypokalemia 11/22/2015  . Leukocytosis 11/22/2015  . Dyspnea 11/21/2015  . Respiratory distress 09/19/2015  . Breast pain 01/11/2013  . Hx MRSA infection   . Lump or mass in breast   . GOITER, MULTINODULAR 01/20/2009  . HYPOGLYCEMIA, UNSPECIFIED  01/20/2009  . GERD 01/20/2009  . DIVERTICULITIS OF COLON 01/20/2009     Past Medical History:  Diagnosis Date  . Asthma 2013  . CHF (congestive heart failure) ([place])   . Diverticulitis 2010  . DVT (deep venous thrombosis) ([place])   . Endometriosis 1990  . Heart disease 2013  . Hx MRSA infection   . Lump or mass in breast   . Restless leg      Past Surgical History:  Procedure Laterality Date  . ABDOMINAL HYSTERECTOMY     age 59  . CARDIAC DEFIBRILLATOR PLACEMENT    . CESAREAN SECTION    . CHOLECYSTECTOMY    . COLECTOMY    . COLONOSCOPY WITH PROPOFOL N/A 02/22/2018   Procedure: COLONOSCOPY WITH PROPOFOL;  Surgeon: Toledo, Benay Pike, MD;  Location: [facility] ENDOSCOPY;  Service: Gastroenterology;  Laterality: N/A;  . ESOPHAGOGASTRODUODENOSCOPY (EGD) WITH PROPOFOL N/A 02/22/2018   Procedure: ESOPHAGOGASTRODUODENOSCOPY (EGD) WITH PROPOFOL;  Surgeon: Toledo, Benay Pike, MD;  Location: [facility] ENDOSCOPY;  Service: Gastroenterology;  Laterality: N/A;  . NASAL SINUS SURGERY  2012    Social History   Socioeconomic History  . Marital status: Married    Spouse name: Not on file  . Number of children: Not on file  . Years of education: Not on file  . Highest education level: Not on file  Occupational History  . Occupation: works at [employer].  Social Needs  . Financial resource strain: Not on file  . Food insecurity:    Worry: Not on file    Inability: Not on file  . Transportation needs:    Medical: Not on file  Non-medical: Not on file  Tobacco Use  . Smoking status: Never Smoker  . Smokeless tobacco: Never Used  Substance and Sexual Activity  . Alcohol use: No  . Drug use: No  . Sexual activity: Yes    Birth control/protection: Post-menopausal  Lifestyle  . Physical activity:    Days per week: Not on file    Minutes per session: Not on file  . Stress: Not on file  Relationships  . Social connections:    Talks on phone: Not on file    Gets together: Not on file     Attends religious service: Not on file    Active member of club or organization: Not on file    Attends meetings of clubs or organizations: Not on file    Relationship status: Not on file  . Intimate partner violence:    Fear of current or ex partner: Not on file    Emotionally abused: Not on file    Physically abused: Not on file    Forced sexual activity: Not on file  Other Topics Concern  . Not on file  Social History Narrative  . Not on file     Family History  Problem Relation Age of Onset  . Cancer Mother 30       ovarian  . CAD Mother   . Cancer Father 44       brain  . CAD Father   . Cancer Daughter 18       skin  . Cancer Maternal Aunt 34       breast     Current Outpatient Medications:  .  acetaminophen (TYLENOL) 325 MG tablet, Take 2 tablets (650 mg total) by mouth every 6 (six) hours as needed for mild pain (or Fever >/= 101)., Disp: , Rfl:  .  albuterol (ACCUNEB) 1.25 MG/3ML nebulizer solution, Take 3 mLs by nebulization every 6 (six) hours as needed for wheezing., Disp: , Rfl: 12 .  beclomethasone (QVAR) 80 MCG/ACT inhaler, Inhale 2 puffs into the lungs 2 (two) times daily., Disp: 1 Inhaler, Rfl: 5 .  calcium acetate (PHOSLO) 667 MG capsule, Take 50 mg by mouth 2 (two) times daily between meals., Disp: , Rfl:  .  calcium carbonate (TUMS - DOSED IN MG ELEMENTAL CALCIUM) 500 MG chewable tablet, Chew 4 tablets by mouth 3 (three) times daily., Disp: , Rfl:  .  cyanocobalamin 500 MCG tablet, Take 500 mcg by mouth daily., Disp: , Rfl:  .  dicyclomine (BENTYL) 10 MG capsule, Take 10 mg by mouth 3 (three) times daily as needed (abdominal pain). , Disp: , Rfl: 3 .  ferrous sulfate 325 (65 FE) MG tablet, Take 325 mg by mouth daily with breakfast., Disp: , Rfl:  .  hyoscyamine (LEVSIN SL) 0.125 MG SL tablet, Place 1 tablet under the tongue every 6 (six) hours as needed for diarrhea or loose stools., Disp: , Rfl: 1 .  levothyroxine (SYNTHROID, LEVOTHROID) 150 MCG tablet,  Take 150 mcg by mouth daily before breakfast., Disp: , Rfl:  .  losartan (COZAAR) 25 MG tablet, Take 12.5 mg by mouth 2 (two) times daily., Disp: , Rfl:  .  metoprolol succinate (TOPROL-XL) 100 MG 24 hr tablet, Take 1 tablet (100 mg total) by mouth daily. Take with or immediately following a meal., Disp: 30 tablet, Rfl: 0 .  Multiple Vitamin (MULTIVITAMIN WITH MINERALS) TABS tablet, Take 1 tablet by mouth daily., Disp: , Rfl:  .  nitroGLYCERIN (NITROSTAT) 0.4 MG SL tablet,  Place 1 tablet (0.4 mg total) under the tongue every 5 (five) minutes as needed for chest pain., Disp: 90 tablet, Rfl: 1 .  pantoprazole (PROTONIX) 40 MG tablet, Take 1 tablet (40 mg total) by mouth daily., Disp: 30 tablet, Rfl: 1 .  rOPINIRole (REQUIP) 2 MG tablet, Take 2 tablets (4 mg total) by mouth at bedtime. 2-4  Mg per night (Patient taking differently: Take 4 mg by mouth at bedtime. ), Disp: , Rfl:  .  sertraline (ZOLOFT) 50 MG tablet, Take 50 mg by mouth daily., Disp: , Rfl:  .  spironolactone (ALDACTONE) 50 MG tablet, Take 50 mg by mouth daily., Disp: , Rfl:  .  traMADol (ULTRAM) 50 MG tablet, Take 1 tablet (50 mg total) by mouth every 6 (six) hours as needed for moderate pain or severe pain., Disp: 20 tablet, Rfl: 0 .  prochlorperazine (COMPAZINE) 10 MG tablet, Take 1 tablet (10 mg total) by mouth every 6 (six) hours as needed for nausea or vomiting. (Patient not taking: Reported on 01/29/2018), Disp: 20 tablet, Rfl: 0   Physical exam:  Vitals:   03/26/18 1044  BP: 103/74  Pulse: 99  Resp: 18  Temp: 98.2 F (36.8 C)  TempSrc: Tympanic  Weight: 216 lb 4.3 oz (98.1 kg)  Height: 5\' 5"  (1.651 m)   Physical Exam  Constitutional: She is oriented to person, place, and time.  Patient is obese.  Does not appear to be in any acute distress  HENT:  Head: Normocephalic and atraumatic.  Eyes: Pupils are equal, round, and reactive to light. EOM are normal.  Neck: Normal range of motion.  Cardiovascular: Normal rate,  regular rhythm and normal heart sounds.  Pulmonary/Chest: Effort normal and breath sounds normal.  Abdominal: Soft. Bowel sounds are normal.  Tenderness to palpation in the epigastrium  Neurological: She is alert and oriented to person, place, and time.  Skin: Skin is warm and dry.       CMP Latest Ref Rng & Units 02/22/2018  Glucose 65 - 99 mg/dL 93  BUN 6 - 20 mg/dL 8  Creatinine 0.44 - 1.00 mg/dL 0.84  Sodium 135 - 145 mmol/L 143  Potassium 3.5 - 5.1 mmol/L 3.7  Chloride 101 - 111 mmol/L 113(H)  CO2 22 - 32 mmol/L 25  Calcium 8.9 - 10.3 mg/dL 8.6(L)  Total Protein 6.5 - 8.1 g/dL -  Total Bilirubin 0.3 - 1.2 mg/dL -  Alkaline Phos 38 - 126 U/L -  AST 15 - 41 U/L -  ALT 14 - 54 U/L -   CBC Latest Ref Rng & Units 02/21/2018  WBC 3.6 - 11.0 K/uL 6.0  Hemoglobin 12.0 - 16.0 g/dL 10.6(L)  Hematocrit 35.0 - 47.0 % 31.6(L)  Platelets 150 - 440 K/uL 236    Assessment and plan- Patient is a 53 y.o. female referred for elevated gastrin  1.  Firstly patient's gastrin level was checked when she was on Protonix which can elevate gastrin level.  I would therefore recommend that GI should check her gastrin level after she has been off PPI.  I have reviewed the results of the pathology with the patient in detail.  Based on pathology there is no evidence of dysplasia or malignancy or evidence of carcinoid.  Neuroendocrine hyperplasia can be seen in chronic atrophic gastritis which is also probably the reason for her secondary elevation of gastrin level.  Typically patients who have gastrinomas or primary cause of elevation of gastrin-would have multiple ulcers in their stomach  or duodenum which was not seen in patient's case.  I would also recommend the patient should get a capsule study to rule out other causes of iron deficiency anemia given that she had a negative EGD and colonoscopy for active bleeding.  In addition capsule study would also look for ulcers in the jejunum which can be seen in  [place] syndrome (clinically the suspicion for that is low at this time).   2.  I would recommend that GI should check her gastrin level off PPI and check gastric pH on the same day. Fasting serum gastrin should be measured in any patient suspected of having ZES . A serum gastrin value greater than 10 times the upper limit of normal (1000 pg/mL) in the presence of a gastric pH below 2 is diagnostic of ZES.  If however her gastrin level is significantly elevated after she is off PPI and her gastric pH is greater than 2 when checked at the same time her gastrin level was checked-this would be consistent with secondary hypergastrinemia likely due to chronic gastritis.  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  If these tests are inconclusive patient can have a secretin stimulation test done by GI to differentiate between primary and secondary hypergastrinemia.  3.  Patient was found to have intestinal metaplasia in her stomach and she would therefore need gastric mapping for the same and if that was found to be negative she would still need EGD with random biopsies every 3 years given risk of malignancy after intestinal metaplasia detected on EGD.  4.  Today I will check CBC, ferritin and iron studies, B12 and folate, anti-intrinsic factor and antiparietal cell antibody as well as stool H. pylori antigen.  If she has evidence of iron deficiency I will proceed with 2 doses of Feraheme 510 mg IV weekly.  Discussed risks and benefits of Feraheme including all but not limited to headache, leg swelling and possible risk of infusion reaction.  Patient understands and agrees to proceed as planned.  I will see her back in 2 months time with repeat CBC ferritin and iron studies  5. I do not see a role for monitoring gastrin levels while she is on PPI and there is no clear diagnosis of Zollinger-Ellison syndrome at this time.  I suspect patient has an underlying  autoimmune etiology for her chronic gastritis and it is possible that her autoimmune etiology is also playing a role in her multinodular goiter as well.  We will touch base with [facility] GI regarding above recommendations.  I also advised the patient to get in touch with GI regarding symptoms of her epigastric pain   Thank you for this kind referral and the opportunity to participate in the care of this patient   Visit Diagnosis 1. Elevated gastrin level   2. Atrophic gastritis without hemorrhage   3. Iron deficiency anemia, unspecified iron deficiency anemia type     Dr. Randa Evens, MD, MPH [facility] [phone] 03/26/2018 1:28 PM

## 2018-03-26 NOTE — Progress Notes (Signed)
Pt new for inc. Levels of gastrin, and anemia. She is still have stomach pain and was given 3 different meds to help with pain but nothing worked yet.  She also has cramping at times from the thyroid surgery-low calcium and low potassium issues.

## 2018-03-27 ENCOUNTER — Telehealth: Payer: Self-pay | Admitting: Oncology

## 2018-03-27 ENCOUNTER — Other Ambulatory Visit: Payer: Self-pay | Admitting: Oncology

## 2018-03-27 DIAGNOSIS — D509 Iron deficiency anemia, unspecified: Secondary | ICD-10-CM

## 2018-03-27 HISTORY — DX: Iron deficiency anemia, unspecified: D50.9

## 2018-03-27 LAB — INTRINSIC FACTOR ANTIBODIES: Intrinsic Factor: 1.3 AU/mL — ABNORMAL HIGH (ref 0.0–1.1)

## 2018-03-27 NOTE — Telephone Encounter (Signed)
-----   Message from Sindy Guadeloupe, MD sent at 03/27/2018  7:57 AM EDT ----- She is iron deficient and will need 2 doses of feraheme. Please inform and schedule. Thanks, Astrid Divine

## 2018-03-27 NOTE — Telephone Encounter (Signed)
New Feraheme weekly x 2 schd and conf w patient for 03/29/18 and 04/04/18, per Dr. Jeanice Lim Notes msg.

## 2018-03-28 ENCOUNTER — Encounter: Payer: Self-pay | Admitting: Emergency Medicine

## 2018-03-28 ENCOUNTER — Other Ambulatory Visit: Payer: Self-pay

## 2018-03-28 ENCOUNTER — Emergency Department
Admission: EM | Admit: 2018-03-28 | Discharge: 2018-03-28 | Disposition: A | Discharge status: Home / Self Care | Payer: Medicaid Other | Attending: Emergency Medicine | Admitting: Emergency Medicine

## 2018-03-28 DIAGNOSIS — R1013 Epigastric pain: Secondary | ICD-10-CM | POA: Insufficient documentation

## 2018-03-28 DIAGNOSIS — J449 Chronic obstructive pulmonary disease, unspecified: Secondary | ICD-10-CM | POA: Insufficient documentation

## 2018-03-28 DIAGNOSIS — Z91040 Latex allergy status: Secondary | ICD-10-CM | POA: Diagnosis not present

## 2018-03-28 DIAGNOSIS — I5022 Chronic systolic (congestive) heart failure: Secondary | ICD-10-CM | POA: Diagnosis not present

## 2018-03-28 DIAGNOSIS — Z79899 Other long term (current) drug therapy: Secondary | ICD-10-CM | POA: Insufficient documentation

## 2018-03-28 DIAGNOSIS — Z853 Personal history of malignant neoplasm of breast: Secondary | ICD-10-CM | POA: Diagnosis not present

## 2018-03-28 DIAGNOSIS — K625 Hemorrhage of anus and rectum: Secondary | ICD-10-CM | POA: Diagnosis present

## 2018-03-28 DIAGNOSIS — K922 Gastrointestinal hemorrhage, unspecified: Secondary | ICD-10-CM | POA: Insufficient documentation

## 2018-03-28 LAB — COMPREHENSIVE METABOLIC PANEL
ALT: 27 U/L (ref 0–44)
ANION GAP: 10 (ref 5–15)
AST: 28 U/L (ref 15–41)
Albumin: 4.1 g/dL (ref 3.5–5.0)
Alkaline Phosphatase: 113 U/L (ref 38–126)
BUN: 15 mg/dL (ref 6–20)
CO2: 31 mmol/L (ref 22–32)
Calcium: 9.6 mg/dL (ref 8.9–10.3)
Chloride: 99 mmol/L (ref 98–111)
Creatinine, Ser: 0.83 mg/dL (ref 0.44–1.00)
GFR calc Af Amer: >60 mL/min (ref >60)
GFR calc non Af Amer: >60 mL/min (ref >60)
GLUCOSE: 92 mg/dL (ref 70–99)
POTASSIUM: 3.5 mmol/L (ref 3.5–5.1)
Sodium: 140 mmol/L (ref 135–145)
Total Bilirubin: 0.5 mg/dL (ref 0.3–1.2)
Total Protein: 8.4 g/dL — ABNORMAL HIGH (ref 6.5–8.1)

## 2018-03-28 LAB — CBC
HCT: 36.8 % (ref 35.0–47.0)
Hemoglobin: 12.6 g/dL (ref 12.0–16.0)
MCH: 27 pg (ref 26.0–34.0)
MCHC: 34.3 g/dL (ref 32.0–36.0)
MCV: 78.6 fL — AB (ref 80.0–100.0)
Platelets: 338 10*3/uL (ref 150–440)
RBC: 4.68 MIL/uL (ref 3.80–5.20)
RDW: 16.2 % — AB (ref 11.5–14.5)
WBC: 12.3 10*3/uL — ABNORMAL HIGH (ref 3.6–11.0)

## 2018-03-28 LAB — CBC WITH DIFFERENTIAL/PLATELET
BASOS ABS: 0.1 10*3/uL (ref 0–0.1)
Basophils Relative: 1 %
Eosinophils Absolute: 0.3 10*3/uL (ref 0–0.7)
Eosinophils Relative: 2 %
HEMATOCRIT: 34.2 % — AB (ref 35.0–47.0)
HEMOGLOBIN: 11.6 g/dL — AB (ref 12.0–16.0)
Lymphocytes Relative: 20 %
Lymphs Abs: 2.3 10*3/uL (ref 1.0–3.6)
MCH: 26.7 pg (ref 26.0–34.0)
MCHC: 33.8 g/dL (ref 32.0–36.0)
MCV: 79 fL — AB (ref 80.0–100.0)
MONOS PCT: 7 %
Monocytes Absolute: 0.8 10*3/uL (ref 0.2–0.9)
Neutro Abs: 8.3 10*3/uL — ABNORMAL HIGH (ref 1.4–6.5)
Neutrophils Relative %: 70 %
Platelets: 264 10*3/uL (ref 150–440)
RBC: 4.34 MIL/uL (ref 3.80–5.20)
RDW: 16.2 % — AB (ref 11.5–14.5)
WBC: 11.8 10*3/uL — ABNORMAL HIGH (ref 3.6–11.0)

## 2018-03-28 LAB — H. PYLORI ANTIGEN, STOOL: H. Pylori Stool Ag, Eia: NEGATIVE

## 2018-03-28 LAB — TYPE AND SCREEN
ABO/RH(D): O NEG
Antibody Screen: NEGATIVE

## 2018-03-28 LAB — LIPASE, BLOOD: Lipase: 44 U/L (ref 11–51)

## 2018-03-28 LAB — ANTI-PARIETAL ANTIBODY: Parietal Cell Antibody-IgG: 21.9 U — ABNORMAL HIGH (ref 0.0–20.0)

## 2018-03-28 MED ORDER — METOCLOPRAMIDE HCL 5 MG/ML IJ SOLN
10.0000 mg | Freq: Once | INTRAMUSCULAR | Status: AC
  Administered 2018-03-28: 10 mg via INTRAVENOUS
  Filled 2018-03-28: qty 2

## 2018-03-28 MED ORDER — METOCLOPRAMIDE HCL 10 MG PO TABS: 10.0000 mg | ORAL_TABLET | Freq: Three times a day (TID) | ORAL | 0 refills | Status: DC | PRN

## 2018-03-28 MED ORDER — SODIUM CHLORIDE 0.9 % IV BOLUS
1000.0000 mL | Freq: Once | INTRAVENOUS | Status: AC
  Administered 2018-03-28: 1000 mL via INTRAVENOUS

## 2018-03-28 MED ORDER — MORPHINE SULFATE (PF) 4 MG/ML IV SOLN
4.0000 mg | Freq: Once | INTRAVENOUS | Status: AC
  Administered 2018-03-28: 4 mg via INTRAVENOUS
  Filled 2018-03-28: qty 1

## 2018-03-28 MED ORDER — DICYCLOMINE HCL 10 MG/ML IM SOLN
20.0000 mg | Freq: Once | INTRAMUSCULAR | Status: AC
  Administered 2018-03-28: 20 mg via INTRAMUSCULAR
  Filled 2018-03-28: qty 2

## 2018-03-28 MED ORDER — HYDROCODONE-ACETAMINOPHEN 5-325 MG PO TABS: 1.0000 | ORAL_TABLET | Freq: Four times a day (QID) | ORAL | 0 refills | Status: AC | PRN

## 2018-03-28 NOTE — ED Triage Notes (Signed)
Patient presents to the ED with 3 episodes of bright red blood in her stools since noon.  Patient has been evaluated by a cancer doctor for masses in her abdomen.  Patients states her hemoglobin has been low and she is supposed to get an iron infusion tomorrow.  Patient states she does not yet have a diagnosis for her previous symptoms but denies history of blood in her stool.

## 2018-03-28 NOTE — ED Provider Notes (Signed)
[facility] Emergency Department Provider Note ____________________________________________   First MD Initiated Contact with Patient 03/28/18 1703     (approximate)  I have reviewed the triage vital signs and the nursing notes.   HISTORY  Chief Complaint Rectal Bleeding    HPI Brittany Nguyen is a 52 y.o. female with PMH as noted below who presents with rectal bleeding since this afternoon, acute onset, with 3 episodes associated with bowel movements.  Patient describes the blood as red, rather than black or tarry.  The patient also reports some epigastric crampy abdominal pain which she has had previously, and recent diarrhea which she reports is chronic.  The patient states that she has been having stomach pain, blood in her stool, anemia, and other GI related problems for the last several months and is currently getting work-up.  She had upper and lower endoscopy performed last month and was told that she had a mass in her stomach which is turned out to be benign.  The patient is scheduled for iron infusion tomorrow.   Past Medical History:  Diagnosis Date  . Asthma 2013  . Breast cancer ([place]) 2014   right side  . CHF (congestive heart failure) ([place])   . Diverticulitis 2010  . DVT (deep venous thrombosis) ([place])   . Endometriosis 1990  . Heart disease 2013  . Hx MRSA infection   . Lump or mass in breast   . Restless leg     Patient Active Problem List   Diagnosis Date Noted  . Iron deficiency anemia 03/27/2018  . Anemia 03/26/2018  . Abdominal pain 02/20/2018  . Unstable angina ([employer])   . Encounter for anticoagulation discussion and counseling   . C. difficile colitis 01/14/2018  . GIB (gastrointestinal bleeding) 01/08/2018  . SOB (shortness of breath) 11/01/2017  . Influenza A 10/29/2017  . Acute respiratory failure ([place]) 09/24/2017  . Sepsis ([place]) 07/03/2017  . COPD (chronic obstructive pulmonary disease) ([place]) 04/26/2017  .  Community acquired pneumonia 04/25/2017  . Chronic systolic heart failure ([place]) 02/03/2017  . Hypotension 02/03/2017  . Chest pain 12/09/2016  . RLS (restless legs syndrome) 12/09/2016  . Cough, persistent 11/22/2015  . Hypokalemia 11/22/2015  . Leukocytosis 11/22/2015  . Dyspnea 11/21/2015  . Respiratory distress 09/19/2015  . Breast pain 01/11/2013  . Hx MRSA infection   . Lump or mass in breast   . GOITER, MULTINODULAR 01/20/2009  . HYPOGLYCEMIA, UNSPECIFIED 01/20/2009  . GERD 01/20/2009  . DIVERTICULITIS OF COLON 01/20/2009    Past Surgical History:  Procedure Laterality Date  . ABDOMINAL HYSTERECTOMY     age 39  . CARDIAC DEFIBRILLATOR PLACEMENT    . CESAREAN SECTION    . CHOLECYSTECTOMY    . COLECTOMY    . COLONOSCOPY WITH PROPOFOL N/A 02/22/2018   Procedure: COLONOSCOPY WITH PROPOFOL;  Surgeon: Toledo, Benay Pike, MD;  Location: [facility] ENDOSCOPY;  Service: Gastroenterology;  Laterality: N/A;  . ESOPHAGOGASTRODUODENOSCOPY (EGD) WITH PROPOFOL N/A 02/22/2018   Procedure: ESOPHAGOGASTRODUODENOSCOPY (EGD) WITH PROPOFOL;  Surgeon: Toledo, Benay Pike, MD;  Location: [facility] ENDOSCOPY;  Service: Gastroenterology;  Laterality: N/A;  . NASAL SINUS SURGERY  2012    Prior to Admission medications   Medication Sig Start Date End Date Taking? Authorizing Provider  acetaminophen (TYLENOL) 325 MG tablet Take 2 tablets (650 mg total) by mouth every 6 (six) hours as needed for mild pain (or Fever >/= 101). 11/01/17   Nicholes Mango, MD  albuterol (ACCUNEB) 1.25 MG/3ML nebulizer solution  Take 3 mLs by nebulization every 6 (six) hours as needed for wheezing. 11/09/17   [provider]  beclomethasone (QVAR) 80 MCG/ACT inhaler Inhale 2 puffs into the lungs 2 (two) times daily. 06/05/17 06/05/18  Laverle Hobby, MD  calcium acetate (PHOSLO) 667 MG capsule Take 50 mg by mouth 2 (two) times daily between meals.    [provider]  calcium carbonate (TUMS - DOSED IN MG ELEMENTAL  CALCIUM) 500 MG chewable tablet Chew 4 tablets by mouth 3 (three) times daily.    [provider]  cyanocobalamin 500 MCG tablet Take 500 mcg by mouth daily.    [provider]  dicyclomine (BENTYL) 10 MG capsule Take 10 mg by mouth 3 (three) times daily as needed (abdominal pain).  12/29/17   [provider]  ferrous sulfate 325 (65 FE) MG tablet Take 325 mg by mouth daily with breakfast.    [provider]  HYDROcodone-acetaminophen (NORCO/VICODIN) 5-325 MG tablet Take 1 tablet by mouth every 6 (six) hours as needed for up to 3 days for severe pain. 03/28/18 03/31/18  Arta Silence, MD  hyoscyamine (LEVSIN SL) 0.125 MG SL tablet Place 1 tablet under the tongue every 6 (six) hours as needed for diarrhea or loose stools. 02/13/18   [provider]  levothyroxine (SYNTHROID, LEVOTHROID) 150 MCG tablet Take 150 mcg by mouth daily before breakfast.    [provider]  losartan (COZAAR) 25 MG tablet Take 12.5 mg by mouth 2 (two) times daily.    [provider]  metoCLOPramide (REGLAN) 10 MG tablet Take 1 tablet (10 mg total) by mouth every 8 (eight) hours as needed for up to 5 days for nausea or vomiting. 03/28/18 04/02/18  Arta Silence, MD  metoprolol succinate (TOPROL-XL) 100 MG 24 hr tablet Take 1 tablet (100 mg total) by mouth daily. Take with or immediately following a meal. 01/16/18   Loletha Grayer, MD  Multiple Vitamin (MULTIVITAMIN WITH MINERALS) TABS tablet Take 1 tablet by mouth daily.    [provider]  nitroGLYCERIN (NITROSTAT) 0.4 MG SL tablet Place 1 tablet (0.4 mg total) under the tongue every 5 (five) minutes as needed for chest pain. 01/24/17   Henreitta Leber, MD  pantoprazole (PROTONIX) 40 MG tablet Take 1 tablet (40 mg total) by mouth daily. 02/01/18   Saundra Shelling, MD  prochlorperazine (COMPAZINE) 10 MG tablet Take 1 tablet (10 mg total) by mouth every 6 (six) hours as needed for nausea or  vomiting. Patient not taking: Reported on 01/29/2018 01/16/18   Loletha Grayer, MD  rOPINIRole (REQUIP) 2 MG tablet Take 2 tablets (4 mg total) by mouth at bedtime. 2-4  Mg per night Patient taking differently: Take 4 mg by mouth at bedtime.  01/16/18   Loletha Grayer, MD  sertraline (ZOLOFT) 50 MG tablet Take 50 mg by mouth daily.    [provider]  spironolactone (ALDACTONE) 50 MG tablet Take 50 mg by mouth daily.    [provider]  traMADol (ULTRAM) 50 MG tablet Take 1 tablet (50 mg total) by mouth every 6 (six) hours as needed for moderate pain or severe pain. 02/01/18   Saundra Shelling, MD    Allergies Contrast media [iodinated diagnostic agents]; Metrizamide; Penicillins; Isosorbide nitrate; Latex; Ondansetron; Zofran [ondansetron hcl]; Povidone-iodine; and Pulmicort [budesonide]  Family History  Problem Relation Age of Onset  . Cancer Mother 30       ovarian  . CAD Mother   . Cancer Father 17  brain  . CAD Father   . Cancer Daughter 18       skin  . Cancer Maternal Aunt 97       breast  . Leukemia Paternal Grandfather     Social History Social History   Tobacco Use  . Smoking status: Never Smoker  . Smokeless tobacco: Never Used  Substance Use Topics  . Alcohol use: No  . Drug use: No    Review of Systems  Constitutional: No fever. Eyes: No redness. ENT: No sore throat. Cardiovascular: Denies chest pain. Respiratory: Denies shortness of breath. Gastrointestinal: Positive for nausea and vomiting.  Positive for diarrhea. Genitourinary: Negative for dysuria.  Musculoskeletal: Negative for back pain. Skin: Negative for rash. Neurological: Negative for headache.   ____________________________________________   PHYSICAL EXAM:  VITAL SIGNS: ED Triage Vitals [03/28/18 1520]  Enc Vitals Group     BP 96/73     Pulse Rate (!) 109     Resp 18     Temp 98.7 F (37.1 C)     Temp Source Oral     SpO2 97 %     Weight 217 lb (98.4 kg)      Height 5\' 5"  (1.651 m)     Head Circumference      Peak Flow      Pain Score 10     Pain Loc      Pain Edu?      Excl. in [place]?     Constitutional: Alert and oriented.  Slightly uncomfortable but overall relatively well-appearing. Eyes: Conjunctivae are normal.  No pallor. Head: Atraumatic. Nose: No congestion/rhinnorhea. Mouth/Throat: Mucous membranes are slightly dry.   Neck: Normal range of motion.  Cardiovascular: Borderline tachycardia, regular rhythm. Grossly normal heart sounds.  Good peripheral circulation. Respiratory: Normal respiratory effort.  No retractions. Lungs CTAB. Gastrointestinal: Soft with mild epigastric tenderness. No distention.  Genitourinary: No flank tenderness. Musculoskeletal: No lower extremity edema.  Extremities warm and well perfused.  Neurologic:  Normal speech and language. No gross focal neurologic deficits are appreciated.  Skin:  Skin is warm and dry. No rash noted. Psychiatric: Mood and affect are normal. Speech and behavior are normal.  ____________________________________________   LABS (all labs ordered are listed, but only abnormal results are displayed)  Labs Reviewed  COMPREHENSIVE METABOLIC PANEL - Abnormal; Notable for the following components:      Result Value   Total Protein 8.4 (*)    All other components within normal limits  CBC - Abnormal; Notable for the following components:   WBC 12.3 (*)    MCV 78.6 (*)    RDW 16.2 (*)    All other components within normal limits  CBC WITH DIFFERENTIAL/PLATELET - Abnormal; Notable for the following components:   WBC 11.8 (*)    Hemoglobin 11.6 (*)    HCT 34.2 (*)    MCV 79.0 (*)    RDW 16.2 (*)    Neutro Abs 8.3 (*)    All other components within normal limits  LIPASE, BLOOD  POC OCCULT BLOOD, ED  TYPE AND SCREEN   ____________________________________________  EKG  ED ECG REPORT I, Arta Silence, the attending physician, personally viewed and interpreted this  ECG.  Date: 03/28/2018 EKG Time: 1857 Rate: 91 Rhythm: normal sinus rhythm QRS Axis: normal Intervals: Prolonged QT, LAFB ST/T Wave abnormalities: normal Narrative Interpretation: no evidence of acute ischemia; no significant change when compared to EKG of 02/24/2018  ____________________________________________  RADIOLOGY    ____________________________________________  PROCEDURES  Procedure(s) performed: No  Procedures  Critical Care performed: No ____________________________________________   INITIAL IMPRESSION / ASSESSMENT AND PLAN / ED COURSE  Pertinent labs & imaging results that were available during my care of the patient were reviewed by me and considered in my medical decision making (see chart for details).  52 year old female with PMH as noted above presents with rectal bleeding today, associated with some diarrhea and epigastric discomfort.  I reviewed the past medical records in epic and confirmed that the patient has been evaluated by GI for anemia and blood in her stool.  She had upper and lower endoscopy in June of this year.  Upper endoscopy showed a gastric mass which has subsequently resulted as benign, but no active bleeding.  Lower endoscopy showed internal hemorrhoids and some diverticuli.  On exam, the patient is relatively well-appearing, she has a borderline heart rate but other vital signs are normal, and the remainder of the exam is as described above.  Overall I suspect most likely bleeding from a lower GI source given that it is red blood and the fact that the patient had internal hemorrhoids on her colonoscopy and had no active upper GI bleeding at that time.  Initial labs reveal normal hemoglobin.  Plan: IV fluids, symptomatic treatment for her abdominal cramping and nausea, and observation with 4-hour repeat CBC.  I will consult GI as well.  I anticipate that if the CBC is stable and the patient's symptoms improve, she may be able to go home  and follow-up with GI as an outpatient.  ----------------------------------------- 10:22 PM on 03/28/2018 -----------------------------------------  The patient reported significantly improved pain after Bentyl and fluids although it did recur at one point and I gave a low dose of morphine.  She is now comfortable and feels well to go home.  Repeat CBC showed no significant change in hemoglobin (one-point drop is consistent with dilution after 1 L fluids).  The patient's vital signs have remained stable.  The remainder of her lab work-up is unremarkable.   I consulted the patient's gastroenterologist, Dr. Alice Reichert, and discussed her care over the phone with him.  We reviewed the work-up results.  He agreed with the management, and advised that if the patient was clinically stable she would be appropriate for discharge home as per my plan.  He recommended that she follow-up with him in approximately 1 week.  In terms of her abdominal pain, I had a discussion with the patient about this.  She states that the pain is similar to pain she has had chronically for at least several months, and had a prior CT in May of this year which showed no significant findings.  Dr. Alice Reichert agreed with me that we could obtain imaging if clinically indicated although it was likely not necessary.  I had an extensive discussion with the patient about her preferences and the overall plan of care.  She expressed a strong desire to avoid CT or other imaging unless absolutely necessary given the fact that she has been imaged many times before due to her diverticulitis, and is worried about radiation risk.  Since she has minimal epigastric tenderness, no peritoneal signs, no concerning lab findings, and stable vital signs, the likelihood of acute surgical pathology is extremely low and I feel that is reasonable to avoid imaging at this time.  At this time the patient feels well to go home.  I discussed the results of work-up and the  plan of care, as well as  Dr. Ricky Stabs follow-up recommendations.  We will give a small amount of hydrocodone which the patient is taken previously for her abdominal pain, as well as nausea medication.  Return precautions given, and the patient expresses understanding.  ____________________________________________   FINAL CLINICAL IMPRESSION(S) / ED DIAGNOSES  Final diagnoses:  Lower GI bleed  Epigastric pain      NEW MEDICATIONS STARTED DURING THIS VISIT:  New Prescriptions   HYDROCODONE-ACETAMINOPHEN (NORCO/VICODIN) 5-325 MG TABLET    Take 1 tablet by mouth every 6 (six) hours as needed for up to 3 days for severe pain.   METOCLOPRAMIDE (REGLAN) 10 MG TABLET    Take 1 tablet (10 mg total) by mouth every 8 (eight) hours as needed for up to 5 days for nausea or vomiting.     Note:  This document was prepared using Dragon voice recognition software and may include unintentional dictation errors.    Arta Silence, MD 03/28/18 2226

## 2018-03-28 NOTE — Discharge Instructions (Signed)
Follow-up with Dr. Alice Reichert in approximately 1 week.  Return to the ER for new, worsening, or persistent severe abdominal pain, vomiting, fevers, weakness, worsening or persistent blood in the stool, any black or tarry stools, lightheadedness, or any other new or worsening symptoms that concern you.

## 2018-03-28 NOTE — ED Notes (Signed)
Pt c/o nausea and midline abd pain that radiates to right side

## 2018-03-29 ENCOUNTER — Inpatient Hospital Stay: Payer: Medicaid Other

## 2018-03-29 VITALS — BP 117/77 | HR 112 | Resp 18

## 2018-03-29 DIAGNOSIS — D509 Iron deficiency anemia, unspecified: Secondary | ICD-10-CM

## 2018-03-29 DIAGNOSIS — E164 Increased secretion of gastrin: Secondary | ICD-10-CM | POA: Diagnosis not present

## 2018-03-29 MED ORDER — FERUMOXYTOL INJECTION 510 MG/17 ML
510.0000 mg | Freq: Once | INTRAVENOUS | Status: AC
  Administered 2018-03-29: 510 mg via INTRAVENOUS
  Filled 2018-03-29: qty 17

## 2018-03-29 MED ORDER — SODIUM CHLORIDE 0.9 % IV SOLN
Freq: Once | INTRAVENOUS | Status: AC
  Administered 2018-03-29: 14:00:00 via INTRAVENOUS
  Filled 2018-03-29: qty 1000

## 2018-03-29 NOTE — Progress Notes (Signed)
Pt c/o tightness in chest and upper back discomfort after feraheme tx. BP 117/77, HR 114, sats 97% on RA, pt states she feels better and those symptoms are resolving. Spoke with Dr Elroy Channel nurse and informed her of pt's reaction to tx, possible premeds prior to next tx

## 2018-04-02 ENCOUNTER — Telehealth: Payer: Self-pay | Admitting: *Deleted

## 2018-04-02 ENCOUNTER — Other Ambulatory Visit: Payer: Self-pay | Admitting: *Deleted

## 2018-04-02 NOTE — Telephone Encounter (Signed)
Called pt and left message that Dr. Janese Banks was notified about her chest discomfort and back pain at end of feraheme inj. Wanted to give her a premed and was going to put steroid as premed for next dose but pt has allergy to budesonide ( pulmicort) wanted to know if pt has every taken oral prednisone or dexamethasone and how did she do with it. Asked her to call me back with answer to question so we can find a med to give her as premed to prevent having the sx. She has on first infusion

## 2018-04-04 ENCOUNTER — Emergency Department: Payer: Medicaid Other

## 2018-04-04 ENCOUNTER — Inpatient Hospital Stay: Payer: Medicaid Other

## 2018-04-04 ENCOUNTER — Other Ambulatory Visit: Payer: Self-pay

## 2018-04-04 ENCOUNTER — Emergency Department
Admission: EM | Admit: 2018-04-04 | Discharge: 2018-04-04 | Disposition: A | Discharge status: Home / Self Care | Payer: Medicaid Other | Attending: Emergency Medicine | Admitting: Emergency Medicine

## 2018-04-04 VITALS — BP 104/66 | HR 88 | Temp 96.0°F | Resp 20

## 2018-04-04 DIAGNOSIS — Y829 Unspecified medical devices associated with adverse incidents: Secondary | ICD-10-CM | POA: Diagnosis not present

## 2018-04-04 DIAGNOSIS — Z853 Personal history of malignant neoplasm of breast: Secondary | ICD-10-CM | POA: Insufficient documentation

## 2018-04-04 DIAGNOSIS — E164 Increased secretion of gastrin: Secondary | ICD-10-CM | POA: Diagnosis not present

## 2018-04-04 DIAGNOSIS — J449 Chronic obstructive pulmonary disease, unspecified: Secondary | ICD-10-CM | POA: Diagnosis not present

## 2018-04-04 DIAGNOSIS — T887XXA Unspecified adverse effect of drug or medicament, initial encounter: Secondary | ICD-10-CM | POA: Diagnosis not present

## 2018-04-04 DIAGNOSIS — T454X5A Adverse effect of iron and its compounds, initial encounter: Secondary | ICD-10-CM | POA: Insufficient documentation

## 2018-04-04 DIAGNOSIS — Z91040 Latex allergy status: Secondary | ICD-10-CM | POA: Insufficient documentation

## 2018-04-04 DIAGNOSIS — Z79899 Other long term (current) drug therapy: Secondary | ICD-10-CM | POA: Diagnosis not present

## 2018-04-04 DIAGNOSIS — R55 Syncope and collapse: Secondary | ICD-10-CM | POA: Diagnosis present

## 2018-04-04 DIAGNOSIS — R110 Nausea: Secondary | ICD-10-CM | POA: Insufficient documentation

## 2018-04-04 DIAGNOSIS — D509 Iron deficiency anemia, unspecified: Secondary | ICD-10-CM

## 2018-04-04 DIAGNOSIS — Z86718 Personal history of other venous thrombosis and embolism: Secondary | ICD-10-CM | POA: Insufficient documentation

## 2018-04-04 DIAGNOSIS — I5022 Chronic systolic (congestive) heart failure: Secondary | ICD-10-CM | POA: Diagnosis not present

## 2018-04-04 LAB — COMPREHENSIVE METABOLIC PANEL
ALBUMIN: 4.1 g/dL (ref 3.5–5.0)
ALT: 25 U/L (ref 0–44)
ANION GAP: 10 (ref 5–15)
AST: 27 U/L (ref 15–41)
Alkaline Phosphatase: 104 U/L (ref 38–126)
BUN: 9 mg/dL (ref 6–20)
CO2: 27 mmol/L (ref 22–32)
Calcium: 8.9 mg/dL (ref 8.9–10.3)
Chloride: 102 mmol/L (ref 98–111)
Creatinine, Ser: 0.98 mg/dL (ref 0.44–1.00)
GFR calc Af Amer: >60 mL/min (ref >60)
GFR calc non Af Amer: >60 mL/min (ref >60)
GLUCOSE: 100 mg/dL — AB (ref 70–99)
POTASSIUM: 3 mmol/L — AB (ref 3.5–5.1)
SODIUM: 139 mmol/L (ref 135–145)
TOTAL PROTEIN: 7.7 g/dL (ref 6.5–8.1)
Total Bilirubin: 0.5 mg/dL (ref 0.3–1.2)

## 2018-04-04 LAB — CBC WITH DIFFERENTIAL/PLATELET
BASOS ABS: 0.1 10*3/uL (ref 0–0.1)
Basophils Relative: 1 %
Eosinophils Absolute: 0.3 10*3/uL (ref 0–0.7)
Eosinophils Relative: 2 %
HCT: 33.1 % — ABNORMAL LOW (ref 35.0–47.0)
HEMOGLOBIN: 11.5 g/dL — AB (ref 12.0–16.0)
Lymphocytes Relative: 25 %
Lymphs Abs: 2.8 10*3/uL (ref 1.0–3.6)
MCH: 27.6 pg (ref 26.0–34.0)
MCHC: 34.9 g/dL (ref 32.0–36.0)
MCV: 79 fL — AB (ref 80.0–100.0)
MONO ABS: 0.7 10*3/uL (ref 0.2–0.9)
MONOS PCT: 7 %
NEUTROS ABS: 7.2 10*3/uL — AB (ref 1.4–6.5)
Neutrophils Relative %: 65 %
Platelets: 290 10*3/uL (ref 150–440)
RBC: 4.19 MIL/uL (ref 3.80–5.20)
RDW: 16.3 % — AB (ref 11.5–14.5)
WBC: 11.1 10*3/uL — ABNORMAL HIGH (ref 3.6–11.0)

## 2018-04-04 LAB — TROPONIN I: Troponin I: <0.03 ng/mL (ref <0.03)

## 2018-04-04 MED ORDER — ACETAMINOPHEN 500 MG PO TABS
1000.0000 mg | ORAL_TABLET | Freq: Once | ORAL | Status: AC
  Administered 2018-04-04: 1000 mg via ORAL

## 2018-04-04 MED ORDER — ACETAMINOPHEN 500 MG PO TABS
ORAL_TABLET | ORAL | Status: AC
  Filled 2018-04-04: qty 2

## 2018-04-04 MED ORDER — DIPHENHYDRAMINE HCL 50 MG/ML IJ SOLN
25.0000 mg | Freq: Once | INTRAMUSCULAR | Status: AC
  Administered 2018-04-04: 25 mg via INTRAVENOUS

## 2018-04-04 MED ORDER — DIPHENHYDRAMINE HCL 25 MG PO CAPS
25.0000 mg | ORAL_CAPSULE | Freq: Once | ORAL | Status: AC
  Administered 2018-04-04: 25 mg via ORAL
  Filled 2018-04-04: qty 1

## 2018-04-04 MED ORDER — GABAPENTIN 600 MG PO TABS
300.0000 mg | ORAL_TABLET | Freq: Once | ORAL | Status: DC
  Filled 2018-04-04: qty 1

## 2018-04-04 MED ORDER — SODIUM CHLORIDE 0.9 % IV SOLN
Freq: Once | INTRAVENOUS | Status: AC
  Administered 2018-04-04: 14:00:00 via INTRAVENOUS
  Filled 2018-04-04: qty 1000

## 2018-04-04 MED ORDER — SODIUM CHLORIDE 0.9 % IV SOLN
510.0000 mg | Freq: Once | INTRAVENOUS | Status: AC
  Administered 2018-04-04: 510 mg via INTRAVENOUS
  Filled 2018-04-04: qty 17

## 2018-04-04 MED ORDER — ALBUTEROL SULFATE (2.5 MG/3ML) 0.083% IN NEBU
2.5000 mg | INHALATION_SOLUTION | Freq: Once | RESPIRATORY_TRACT | Status: AC
  Administered 2018-04-04: 2.5 mg via RESPIRATORY_TRACT

## 2018-04-04 MED ORDER — POTASSIUM CHLORIDE CRYS ER 20 MEQ PO TBCR: 40.0000 meq | EXTENDED_RELEASE_TABLET | Freq: Once | ORAL | Status: DC

## 2018-04-04 MED ORDER — FAMOTIDINE IN NACL 20-0.9 MG/50ML-% IV SOLN
20.0000 mg | Freq: Two times a day (BID) | INTRAVENOUS | Status: DC
  Administered 2018-04-04: 20 mg via INTRAVENOUS

## 2018-04-04 NOTE — Discharge Instructions (Addendum)
Please seek medical attention for any high fevers, chest pain, shortness of breath, change in behavior, persistent vomiting, bloody stool or any other new or concerning symptoms.  

## 2018-04-04 NOTE — Progress Notes (Signed)
Today patient was scheduled for 2nd treatment of feraheme.  Within 5 minutes after treatment began patient became hot and nauseated. Treatment stopped, Rulon Abide NP came to infusion.  Emergency medications given per  [facility] instruction.  Patient did not have any relief and was taken to ED.

## 2018-04-04 NOTE — ED Provider Notes (Signed)
[facility] Emergency Department Provider Note   ____________________________________________   I have reviewed the triage vital signs and the nursing notes.   HISTORY  Chief Complaint Medication Reaction and Shortness of Breath   History limited by: Not Limited   HPI Brittany Nguyen is a 52 y.o. female who presents to the emergency department today because of concern for reaction to infusion. Patient was at cancer center for infusion of feraheme. Shortly after it started the patient states that she developed nausea. Had a nurse help her to the bathroom where she stated she passed out. She is also having some chest and back discomfort. Patient states that this is her second time having the infusion and last week she also had a reaction but not as severe. She does state she has not been eating or drinking much recently. She denies any recent illness.  Per medical record review patient has a history of ER visit 2 days ago for chest pain.  Past Medical History:  Diagnosis Date  . Asthma 2013  . Breast cancer ([place]) 2014   right side  . CHF (congestive heart failure) ([place])   . Diverticulitis 2010  . DVT (deep venous thrombosis) ([place])   . Endometriosis 1990  . Heart disease 2013  . Hx MRSA infection   . Lump or mass in breast   . Restless leg     Patient Active Problem List   Diagnosis Date Noted  . Iron deficiency anemia 03/27/2018  . Anemia 03/26/2018  . Abdominal pain 02/20/2018  . Unstable angina ([place])   . Encounter for anticoagulation discussion and counseling   . C. difficile colitis 01/14/2018  . GIB (gastrointestinal bleeding) 01/08/2018  . SOB (shortness of breath) 11/01/2017  . Influenza A 10/29/2017  . Acute respiratory failure ([place]) 09/24/2017  . Sepsis ([place]) 07/03/2017  . COPD (chronic obstructive pulmonary disease) ([place]) 04/26/2017  . Community acquired pneumonia 04/25/2017  . Chronic systolic heart failure ([place]) 02/03/2017  .  Hypotension 02/03/2017  . Chest pain 12/09/2016  . RLS (restless legs syndrome) 12/09/2016  . Cough, persistent 11/22/2015  . Hypokalemia 11/22/2015  . Leukocytosis 11/22/2015  . Dyspnea 11/21/2015  . Respiratory distress 09/19/2015  . Breast pain 01/11/2013  . Hx MRSA infection   . Lump or mass in breast   . GOITER, MULTINODULAR 01/20/2009  . HYPOGLYCEMIA, UNSPECIFIED 01/20/2009  . GERD 01/20/2009  . DIVERTICULITIS OF COLON 01/20/2009    Past Surgical History:  Procedure Laterality Date  . ABDOMINAL HYSTERECTOMY     age 52  . CARDIAC DEFIBRILLATOR PLACEMENT    . CESAREAN SECTION    . CHOLECYSTECTOMY    . COLECTOMY    . COLONOSCOPY WITH PROPOFOL N/A 02/22/2018   Procedure: COLONOSCOPY WITH PROPOFOL;  Surgeon: Toledo, Benay Pike, MD;  Location: [facility] ENDOSCOPY;  Service: Gastroenterology;  Laterality: N/A;  . ESOPHAGOGASTRODUODENOSCOPY (EGD) WITH PROPOFOL N/A 02/22/2018   Procedure: ESOPHAGOGASTRODUODENOSCOPY (EGD) WITH PROPOFOL;  Surgeon: Toledo, Benay Pike, MD;  Location: [facility] ENDOSCOPY;  Service: Gastroenterology;  Laterality: N/A;  . NASAL SINUS SURGERY  2012    Prior to Admission medications   Medication Sig Start Date End Date Taking? Authorizing Provider  acetaminophen (TYLENOL) 325 MG tablet Take 2 tablets (650 mg total) by mouth every 6 (six) hours as needed for mild pain (or Fever >/= 101). 11/01/17   Gouru, Illene Silver, MD  albuterol (ACCUNEB) 1.25 MG/3ML nebulizer solution Take 3 mLs by nebulization every 6 (six) hours as needed for wheezing. 11/09/17  [provider]  beclomethasone (QVAR) 80 MCG/ACT inhaler Inhale 2 puffs into the lungs 2 (two) times daily. 06/05/17 06/05/18  Laverle Hobby, MD  calcium acetate (PHOSLO) 667 MG capsule Take 50 mg by mouth 2 (two) times daily between meals.    [provider]  calcium carbonate (TUMS - DOSED IN MG ELEMENTAL CALCIUM) 500 MG chewable tablet Chew 4 tablets by mouth 3 (three) times daily.    [provider]  cyanocobalamin 500 MCG tablet Take 500 mcg by mouth daily.    [provider]  dicyclomine (BENTYL) 10 MG capsule Take 10 mg by mouth 3 (three) times daily as needed (abdominal pain).  12/29/17   [provider]  ferrous sulfate 325 (65 FE) MG tablet Take 325 mg by mouth daily with breakfast.    [provider]  hyoscyamine (LEVSIN SL) 0.125 MG SL tablet Place 1 tablet under the tongue every 6 (six) hours as needed for diarrhea or loose stools. 02/13/18   [provider]  levothyroxine (SYNTHROID, LEVOTHROID) 150 MCG tablet Take 150 mcg by mouth daily before breakfast.    [provider]  losartan (COZAAR) 25 MG tablet Take 12.5 mg by mouth 2 (two) times daily.    [provider]  metoCLOPramide (REGLAN) 10 MG tablet Take 1 tablet (10 mg total) by mouth every 8 (eight) hours as needed for up to 5 days for nausea or vomiting. 03/28/18 04/02/18  Arta Silence, MD  metoprolol succinate (TOPROL-XL) 100 MG 24 hr tablet Take 1 tablet (100 mg total) by mouth daily. Take with or immediately following a meal. 01/16/18   Loletha Grayer, MD  Multiple Vitamin (MULTIVITAMIN WITH MINERALS) TABS tablet Take 1 tablet by mouth daily.    [provider]  nitroGLYCERIN (NITROSTAT) 0.4 MG SL tablet Place 1 tablet (0.4 mg total) under the tongue every 5 (five) minutes as needed for chest pain. 01/24/17   Henreitta Leber, MD  pantoprazole (PROTONIX) 40 MG tablet Take 1 tablet (40 mg total) by mouth daily. 02/01/18   Saundra Shelling, MD  prochlorperazine (COMPAZINE) 10 MG tablet Take 1 tablet (10 mg total) by mouth every 6 (six) hours as needed for nausea or vomiting. Patient not taking: Reported on 01/29/2018 01/16/18   Loletha Grayer, MD  rOPINIRole (REQUIP) 2 MG tablet Take 2 tablets (4 mg total) by mouth at bedtime. 2-4  Mg per night Patient taking differently: Take 4 mg by mouth at bedtime.  01/16/18   Loletha Grayer, MD  sertraline  (ZOLOFT) 50 MG tablet Take 50 mg by mouth daily.    [provider]  spironolactone (ALDACTONE) 50 MG tablet Take 50 mg by mouth daily.    [provider]  traMADol (ULTRAM) 50 MG tablet Take 1 tablet (50 mg total) by mouth every 6 (six) hours as needed for moderate pain or severe pain. 02/01/18   Saundra Shelling, MD    Allergies Contrast media [iodinated diagnostic agents]; Metrizamide; Penicillins; Isosorbide nitrate; Latex; Ondansetron; Zofran [ondansetron hcl]; Povidone-iodine; and Pulmicort [budesonide]  Family History  Problem Relation Age of Onset  . Cancer Mother 30       ovarian  . CAD Mother   . Cancer Father 18       brain  . CAD Father   . Cancer Daughter 18       skin  . Cancer Maternal Aunt 35       breast  . Leukemia Paternal Grandfather     Social History  Social History   Tobacco Use  . Smoking status: Never Smoker  . Smokeless tobacco: Never Used  Substance Use Topics  . Alcohol use: No  . Drug use: No    Review of Systems Constitutional: No fever/chills Eyes: No visual changes. ENT: No sore throat. Cardiovascular: Positive for chest pain. Respiratory: Denies shortness of breath. Gastrointestinal: No abdominal pain. Positive for nausea.  Genitourinary: Negative for dysuria. Musculoskeletal: Negative for back pain. Skin: Negative for rash. Neurological: Negative for headaches, focal weakness or numbness.  ____________________________________________   PHYSICAL EXAM:  VITAL SIGNS: ED Triage Vitals  Enc Vitals Group     BP 04/04/18 1511 108/72     Pulse Rate 04/04/18 1511 98     Resp 04/04/18 1511 (!) 22     Temp 04/04/18 1511 98.5 F (36.9 C)     Temp Source 04/04/18 1511 Oral     SpO2 04/04/18 1510 100 %     Weight 04/04/18 1511 220 lb (99.8 kg)     Height 04/04/18 1511 5\' 5"  (1.651 m)     Head Circumference --      Peak Flow --      Pain Score 04/04/18 1511 9   Constitutional: Alert and oriented.  Eyes: Conjunctivae  are normal.  ENT      Head: Normocephalic and atraumatic.      Nose: No congestion/rhinnorhea.      Mouth/Throat: Mucous membranes are moist.      Neck: No stridor. Hematological/Lymphatic/Immunilogical: No cervical lymphadenopathy. Cardiovascular: Normal rate, regular rhythm.  No murmurs, rubs, or gallops.  Respiratory: Normal respiratory effort without tachypnea nor retractions. Breath sounds are clear and equal bilaterally. No wheezes/rales/rhonchi. Gastrointestinal: Soft and non tender. No rebound. No guarding.  Genitourinary: Deferred Musculoskeletal: Normal range of motion in all extremities. No lower extremity edema. Neurologic:  Normal speech and language. No gross focal neurologic deficits are appreciated.  Skin:  Skin is warm, dry and intact. No rash noted. Psychiatric: Mood and affect are normal. Speech and behavior are normal. Patient exhibits appropriate insight and judgment.  ____________________________________________    LABS (pertinent positives/negatives)  CBC wbc 11.1, hgb 11.5, plt 290 CMP na 139, k 3.0, cr 0.98 Trop <0.03  ____________________________________________   EKG  I, Nance Pear, attending physician, personally viewed and interpreted this EKG  EKG Time: 1504 Rate: 102 Rhythm: sinus tachycardia Axis: left axis deviation Intervals: qtc 606 QRS: narrow ST changes: no st elevation Impression: abnormal ekg   ____________________________________________    RADIOLOGY  CXR No acute disease  ____________________________________________   PROCEDURES  Procedures  ____________________________________________   INITIAL IMPRESSION / ASSESSMENT AND PLAN / ED COURSE  Pertinent labs & imaging results that were available during my care of the patient were reviewed by me and considered in my medical decision making (see chart for details).   Patient presented to the emergency department today after a possible reaction to infusion and  cancer center.  The patient had blood work drawn here which showed some slight hypokalemia.  Otherwise work-up without concerning findings.  Patient was brought to the emergency department for couple of hours without any worsening symptoms.  She did feel comfortable going home at time of discharge.   ____________________________________________   FINAL CLINICAL IMPRESSION(S) / ED DIAGNOSES  Final diagnoses:  Syncope, unspecified syncope type     Note: This dictation was prepared with Dragon dictation. Any transcriptional errors that result from this process are unintentional     Nance Pear, MD 04/04/18 1744

## 2018-04-04 NOTE — ED Triage Notes (Signed)
Pt sent from infusion center for possible reaction. Received 510mg  Feraheme IV and then began to have CP, back pain and reports SOB. Infusion center gave 25mg  po benadryl and 25mg  IV benadryl and 1 albuterol neb tx. IV pepcid was started by infusiuon center as well. Pt had medication once before and had CP post infusion. Hx MI and pacemaker/AICD. C/o CP, SOB and back pain. +coughing

## 2018-04-05 ENCOUNTER — Telehealth: Payer: Self-pay | Admitting: *Deleted

## 2018-04-05 ENCOUNTER — Other Ambulatory Visit: Payer: Self-pay | Admitting: Oncology

## 2018-04-05 NOTE — Telephone Encounter (Signed)
Called pt to let her know that  Dr. Janese Banks read over the notes and ED about her reaction to the feraheme.  She would like to start her on another iron treatment venofer and she will get benadryl first and do a small amount of test dose and then go from there if she tolerates it. Will repeat dose if she does well.  She is agreeable to the above and she was very impressed with the care she had with infusion during this time of the reaction.  When she got sent to ER. She said she had rude Nurse named Elmyra Ricks and she told her that she did not have a reaction that if she had one she would have a rash, and she did not have it. She also said that the nurse told her that she is just anxious.  The patient then said that the nurse would not come back in her room to give her medication and when pt refused it she just wanted to go home and when she walked out and the nurse was sitting outside the room listening to what the pt was saying. I apologized for her experience and hope that in the future if should have to go that it would be a better experience. I will try to find a number that patient can call to speak to someone about her experience.

## 2018-04-08 IMAGING — NM NM PULMONARY VENT & PERF
3 series · 18 of 18 positions shown · non-contrast
Comparison: Correlation with chest radiographs dated 01/21/2017

CLINICAL DATA: Dyspnea

EXAM:
NUCLEAR MEDICINE VENTILATION - PERFUSION LUNG SCAN
TECHNIQUE: Ventilation images were obtained in multiple projections using
inhaled aerosol Dc-CCm DTPA. Perfusion images were obtained in
multiple projections after intravenous injection of Dc-CCm MAA.
RADIOPHARMACEUTICALS:  31.9 mCi Dechnetium-QQm DTPA aerosol
inhalation and 3.9 mCi Dechnetium-QQm MAA IV

[Series 1000: lung perfusion · 1.95mm/px · 4 acquisitions, 8 frames shown]
[im 1/4]
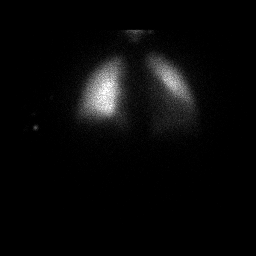
[im 1/4]
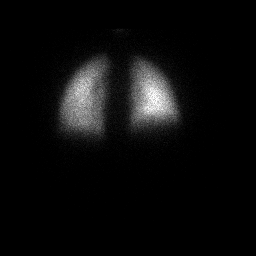
[im 2/4]
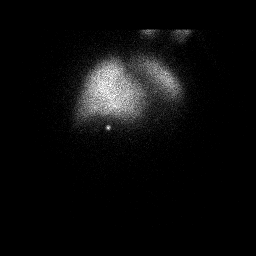
[im 2/4]
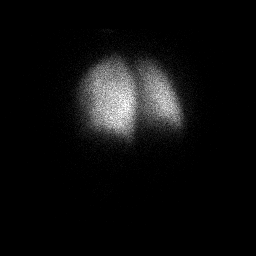
[im 3/4]
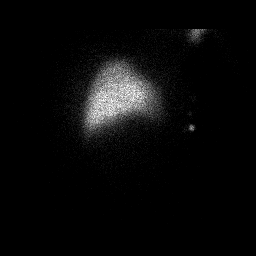
[im 3/4]
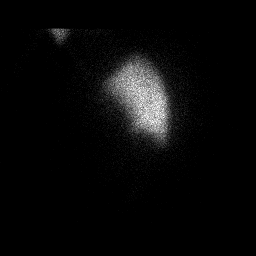
[im 4/4]
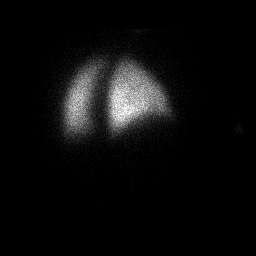
[im 4/4]
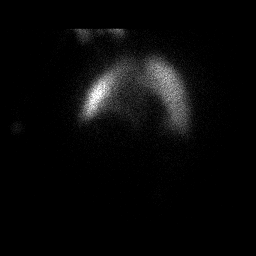

[Series 1000: lung ventilation · 3.90mm/px · 4 acquisitions, 8 frames shown]
[im 1/4]
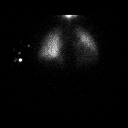
[im 1/4]
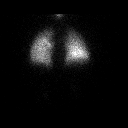
[im 2/4]
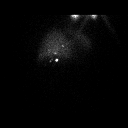
[im 2/4]
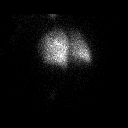
[im 3/4]
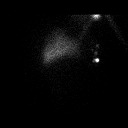
[im 3/4]
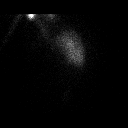
[im 4/4]
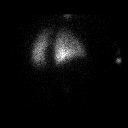
[im 4/4]
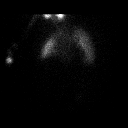

[Series 1000: static · 4.80mm/px · 2 of 2 frames shown]
[frame 1/2]
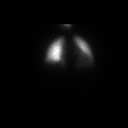
[frame 2/2]
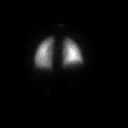

[18 of 18 positions shown; findings below may reference images not displayed]

FINDINGS: Ventilation: No focal ventilation defect.

Perfusion: No wedge shaped peripheral perfusion defects to suggest
acute pulmonary embolism.

Corresponding chest radiographs are clear.
IMPRESSION: Negative for pulmonary embolism.

## 2018-04-08 IMAGING — CR DG CHEST 2V
1 series · 2 of 2 positions shown · non-contrast
Comparison: December 08, 2016

CLINICAL DATA: Asthma.  Difficulty breathing.

EXAM:
CHEST  2 VIEW

[Series 1: dg chest 2 view · 0.14mm/px · 2 of 2 slices shown]
[im 1/2]
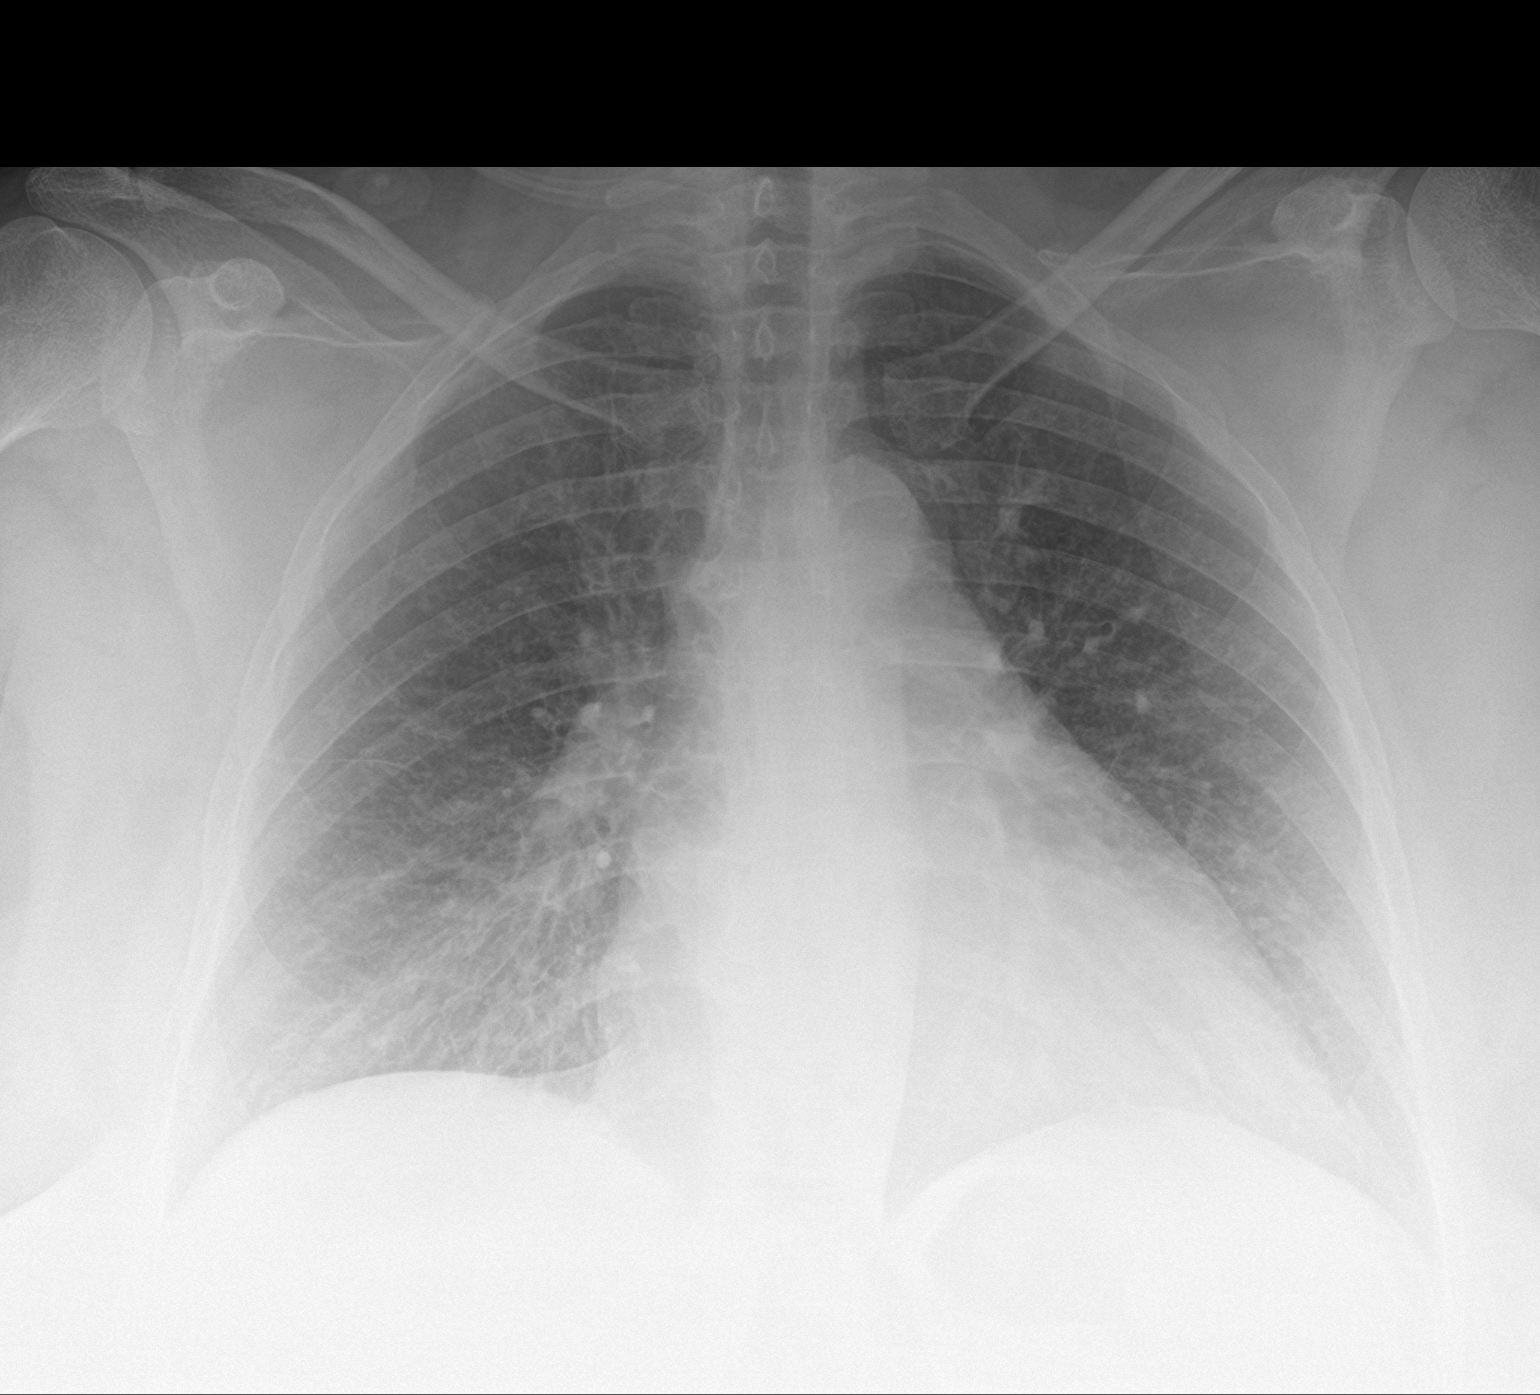
[im 2/2]
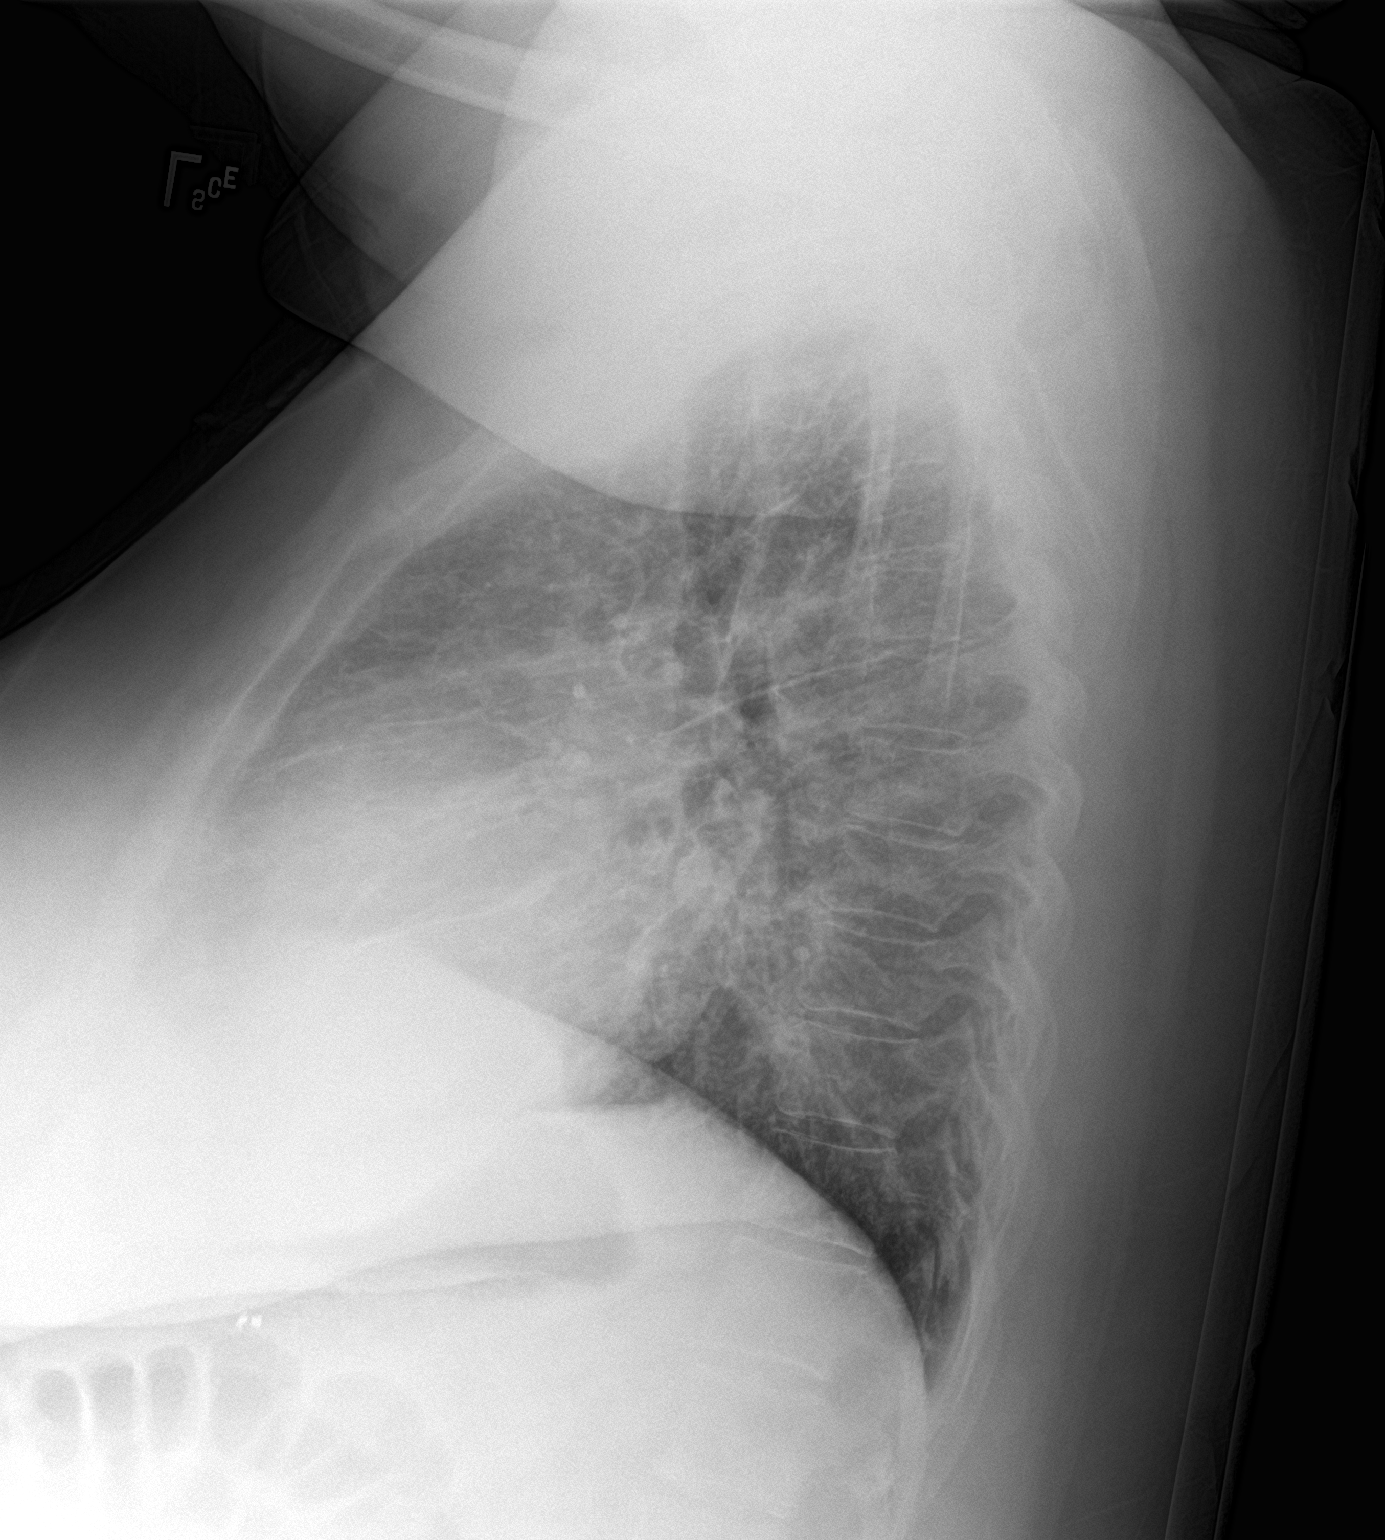

[2 of 2 positions shown; findings below may reference images not displayed]

FINDINGS: The heart size is borderline to mildly enlarged. No pneumothorax. No
pulmonary nodules or masses. No focal infiltrates. Haziness of the
lung bases is likely due to body habitus. No overt edema.
IMPRESSION: No active cardiopulmonary disease.

## 2018-04-10 DIAGNOSIS — R109 Unspecified abdominal pain: Secondary | ICD-10-CM | POA: Insufficient documentation

## 2018-04-10 DIAGNOSIS — R197 Diarrhea, unspecified: Secondary | ICD-10-CM | POA: Insufficient documentation

## 2018-04-15 IMAGING — DX DG CHEST 1V PORT
1 series · 1 of 1 positions shown · non-contrast
Comparison: Radiographs January 21, 2017.

CLINICAL DATA: Chest pain.

EXAM:
PORTABLE CHEST 1 VIEW

[chest ap]
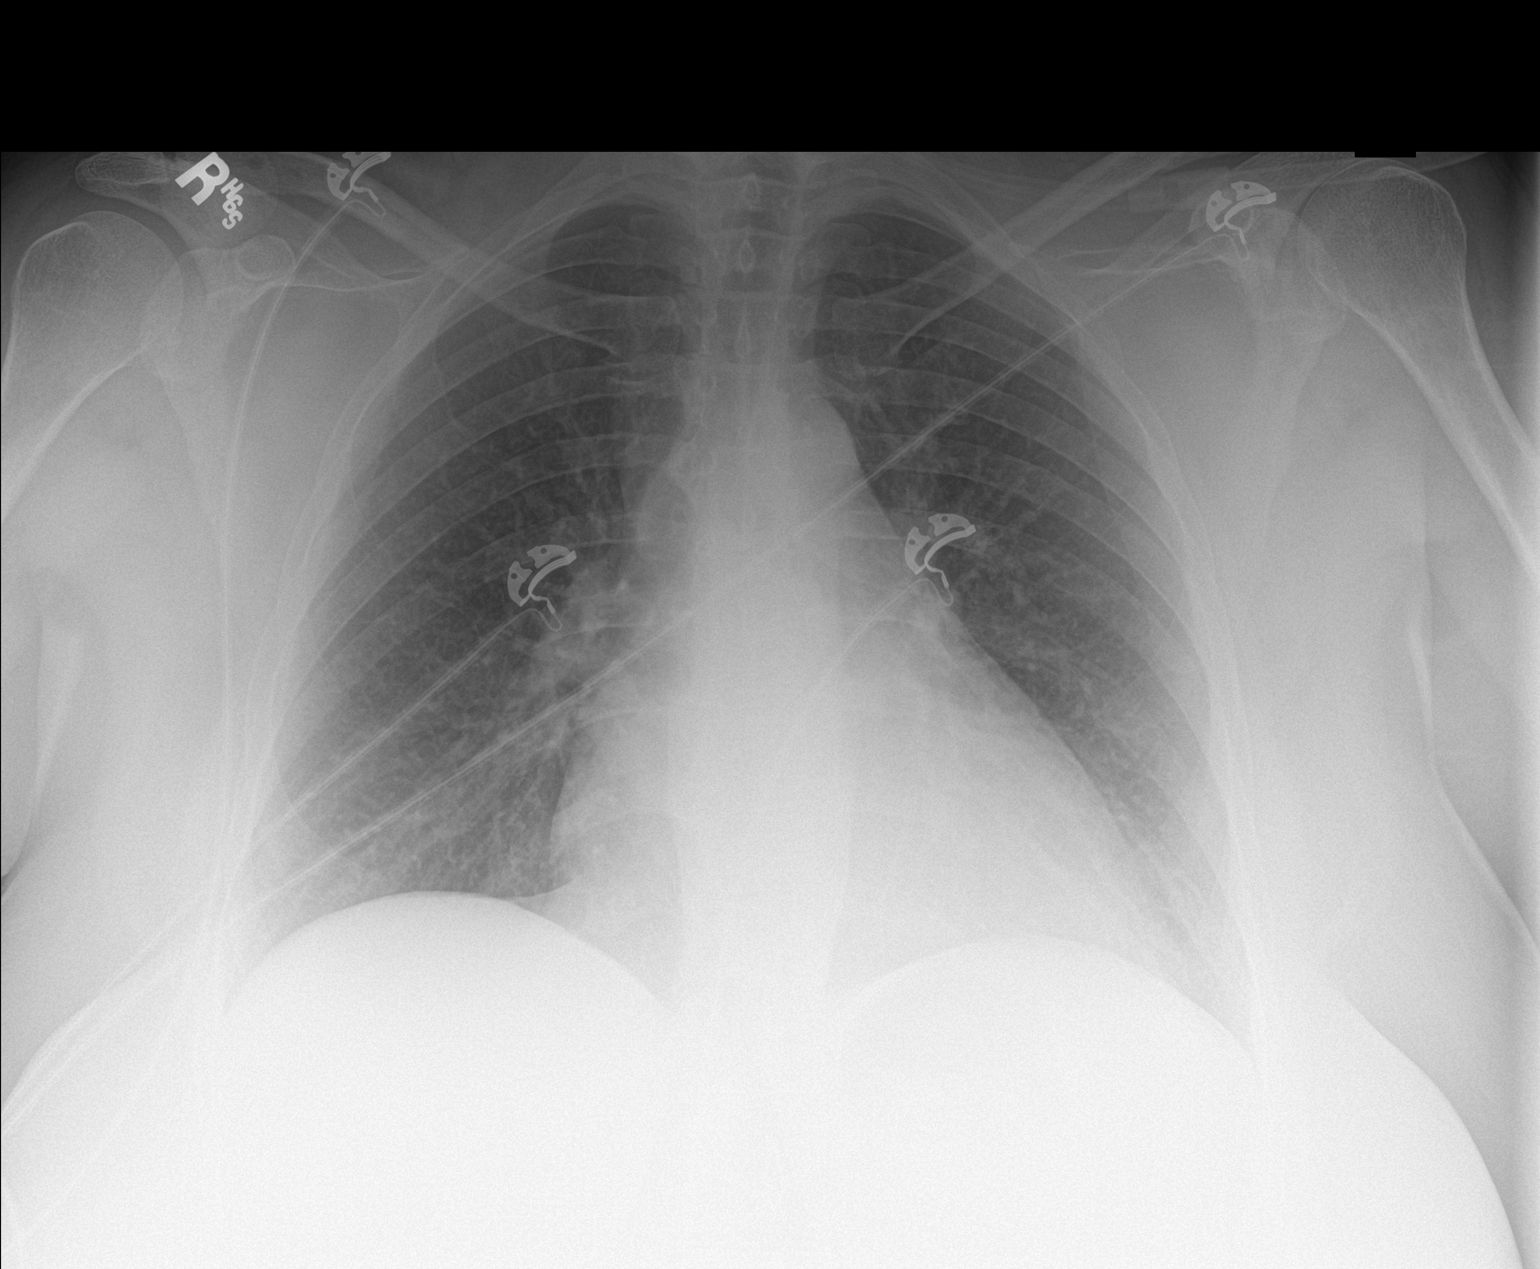

[1 of 1 positions shown; findings below may reference images not displayed]

FINDINGS: Stable cardiomediastinal silhouette. No pneumothorax or pleural
effusion is noted. Both lungs are clear. The visualized skeletal
structures are unremarkable.
IMPRESSION: No acute cardiopulmonary abnormality seen.

## 2018-04-17 ENCOUNTER — Inpatient Hospital Stay: Payer: Medicaid Other | Attending: Oncology

## 2018-04-17 VITALS — BP 90/64 | HR 81 | Temp 96.1°F | Resp 16

## 2018-04-17 DIAGNOSIS — D509 Iron deficiency anemia, unspecified: Secondary | ICD-10-CM | POA: Diagnosis not present

## 2018-04-17 MED ORDER — IRON SUCROSE 20 MG/ML IV SOLN
200.0000 mg | INTRAVENOUS | Status: DC
  Administered 2018-04-17: 200 mg via INTRAVENOUS
  Filled 2018-04-17: qty 10

## 2018-04-17 MED ORDER — DIPHENHYDRAMINE HCL 50 MG/ML IJ SOLN
12.5000 mg | Freq: Once | INTRAMUSCULAR | Status: AC
  Administered 2018-04-17: 12.5 mg via INTRAVENOUS
  Filled 2018-04-17: qty 1

## 2018-04-17 MED ORDER — ACETAMINOPHEN 325 MG PO TABS
650.0000 mg | ORAL_TABLET | Freq: Once | ORAL | Status: AC
  Administered 2018-04-17: 650 mg via ORAL
  Filled 2018-04-17: qty 2

## 2018-04-17 MED ORDER — SODIUM CHLORIDE 0.9 % IV SOLN
Freq: Once | INTRAVENOUS | Status: AC
  Administered 2018-04-17: 14:00:00 via INTRAVENOUS
  Filled 2018-04-17: qty 1000

## 2018-04-24 ENCOUNTER — Inpatient Hospital Stay: Payer: Medicaid Other

## 2018-04-24 ENCOUNTER — Other Ambulatory Visit: Payer: Self-pay | Admitting: Oncology

## 2018-04-24 VITALS — BP 100/66 | HR 76 | Temp 97.6°F | Resp 18

## 2018-04-24 DIAGNOSIS — D509 Iron deficiency anemia, unspecified: Secondary | ICD-10-CM

## 2018-04-24 MED ORDER — DIPHENHYDRAMINE HCL 50 MG/ML IJ SOLN
12.5000 mg | Freq: Once | INTRAMUSCULAR | Status: AC
  Administered 2018-04-24: 12.5 mg via INTRAVENOUS
  Filled 2018-04-24: qty 1

## 2018-04-24 MED ORDER — ACETAMINOPHEN 325 MG PO TABS
650.0000 mg | ORAL_TABLET | Freq: Once | ORAL | Status: AC
  Administered 2018-04-24: 650 mg via ORAL
  Filled 2018-04-24: qty 2

## 2018-04-24 MED ORDER — SODIUM CHLORIDE 0.9 % IV SOLN: 200.0000 mg | INTRAVENOUS | Status: DC

## 2018-04-24 MED ORDER — PROMETHAZINE HCL 25 MG/ML IJ SOLN
6.2500 mg | Freq: Once | INTRAMUSCULAR | Status: AC
  Administered 2018-04-24: 6.25 mg via INTRAVENOUS
  Filled 2018-04-24: qty 1

## 2018-04-24 MED ORDER — SODIUM CHLORIDE 0.9 % IV SOLN
Freq: Once | INTRAVENOUS | Status: AC
  Administered 2018-04-24: 14:00:00 via INTRAVENOUS
  Filled 2018-04-24: qty 250

## 2018-04-24 MED ORDER — IRON SUCROSE 20 MG/ML IV SOLN
200.0000 mg | Freq: Once | INTRAVENOUS | Status: AC
  Administered 2018-04-24: 200 mg via INTRAVENOUS
  Filled 2018-04-24: qty 10

## 2018-04-24 MED ORDER — ONDANSETRON HCL 4 MG/2ML IJ SOLN
INTRAMUSCULAR | Status: AC
  Filled 2018-04-24: qty 2

## 2018-04-29 ENCOUNTER — Emergency Department
Admission: EM | Admit: 2018-04-29 | Discharge: 2018-04-29 | Disposition: A | Discharge status: Home / Self Care | Payer: Medicaid Other | Attending: Emergency Medicine | Admitting: Emergency Medicine

## 2018-04-29 ENCOUNTER — Emergency Department: Payer: Medicaid Other

## 2018-04-29 ENCOUNTER — Encounter: Payer: Self-pay | Admitting: Emergency Medicine

## 2018-04-29 ENCOUNTER — Other Ambulatory Visit: Payer: Self-pay

## 2018-04-29 DIAGNOSIS — I5022 Chronic systolic (congestive) heart failure: Secondary | ICD-10-CM | POA: Insufficient documentation

## 2018-04-29 DIAGNOSIS — M25561 Pain in right knee: Secondary | ICD-10-CM | POA: Diagnosis present

## 2018-04-29 DIAGNOSIS — Z91040 Latex allergy status: Secondary | ICD-10-CM | POA: Insufficient documentation

## 2018-04-29 DIAGNOSIS — J45909 Unspecified asthma, uncomplicated: Secondary | ICD-10-CM | POA: Insufficient documentation

## 2018-04-29 DIAGNOSIS — Z853 Personal history of malignant neoplasm of breast: Secondary | ICD-10-CM | POA: Insufficient documentation

## 2018-04-29 DIAGNOSIS — Z79899 Other long term (current) drug therapy: Secondary | ICD-10-CM | POA: Insufficient documentation

## 2018-04-29 DIAGNOSIS — M1711 Unilateral primary osteoarthritis, right knee: Secondary | ICD-10-CM | POA: Diagnosis not present

## 2018-04-29 DIAGNOSIS — W19XXXA Unspecified fall, initial encounter: Secondary | ICD-10-CM

## 2018-04-29 DIAGNOSIS — Z9049 Acquired absence of other specified parts of digestive tract: Secondary | ICD-10-CM | POA: Diagnosis not present

## 2018-04-29 DIAGNOSIS — Y92009 Unspecified place in unspecified non-institutional (private) residence as the place of occurrence of the external cause: Secondary | ICD-10-CM

## 2018-04-29 MED ORDER — MORPHINE SULFATE (PF) 4 MG/ML IV SOLN
4.0000 mg | Freq: Once | INTRAVENOUS | Status: AC
  Administered 2018-04-29: 4 mg via INTRAMUSCULAR
  Filled 2018-04-29: qty 1

## 2018-04-29 MED ORDER — PREDNISONE 20 MG PO TABS
60.0000 mg | ORAL_TABLET | Freq: Once | ORAL | Status: AC
  Administered 2018-04-29: 60 mg via ORAL
  Filled 2018-04-29: qty 3

## 2018-04-29 MED ORDER — PROMETHAZINE HCL 25 MG PO TABS
12.5000 mg | ORAL_TABLET | Freq: Once | ORAL | Status: AC
  Administered 2018-04-29: 12.5 mg via ORAL
  Filled 2018-04-29: qty 1

## 2018-04-29 MED ORDER — OXYCODONE HCL 5 MG PO TABS: 5.0000 mg | ORAL_TABLET | Freq: Four times a day (QID) | ORAL | 0 refills | Status: DC | PRN

## 2018-04-29 MED ORDER — PROMETHAZINE HCL 12.5 MG PO TABS: 12.5000 mg | ORAL_TABLET | Freq: Four times a day (QID) | ORAL | 0 refills | Status: DC | PRN

## 2018-04-29 MED ORDER — PREDNISONE 10 MG PO TABS: 10.0000 mg | ORAL_TABLET | Freq: Every day | ORAL | 0 refills | Status: DC

## 2018-04-29 MED ORDER — OXYCODONE-ACETAMINOPHEN 5-325 MG PO TABS
1.0000 | ORAL_TABLET | Freq: Once | ORAL | Status: AC
  Administered 2018-04-29: 1 via ORAL
  Filled 2018-04-29: qty 1

## 2018-04-29 NOTE — ED Triage Notes (Signed)
Pt presents to ED via POV with c/o mechanical fall and R knee pain. Pt states was cleaning and a chair turned over and she landed on R knee.

## 2018-04-29 NOTE — ED Provider Notes (Signed)
[facility] EMERGENCY DEPARTMENT Provider Note   CSN: [id number] Arrival date & time: 04/29/18  1420     History   Chief Complaint Chief Complaint  Patient presents with  . Fall  . Knee Pain    HPI Brittany Nguyen is a 52 y.o. female.  Presents emergency department for evaluation of right knee pain.  Patient standing in a chair just prior to arrival when she fell and landed directly on her right knee.  She points the pain being located along the medial joint line of the right knee.  She has been able to stand but unable to walk due to pain within the right knee.  She denies any hip or ankle pain.  Denies hitting her head or losing consciousness.  No neck or back pain.  Her pain is severe 10 out of 10.  She is not any medications for pain.  She has a history of ACL tear several years ago, was scheduled to undergo ACL reconstruction but unfortunately never went through with surgery.    HPI  Past Medical History:  Diagnosis Date  . Asthma 2013  . Breast cancer ([place]) 2014   right side  . CHF (congestive heart failure) ([place])   . Diverticulitis 2010  . DVT (deep venous thrombosis) ([place])   . Endometriosis 1990  . Heart disease 2013  . Hx MRSA infection   . Lump or mass in breast   . Restless leg     Patient Active Problem List   Diagnosis Date Noted  . Iron deficiency anemia 03/27/2018  . Anemia 03/26/2018  . Abdominal pain 02/20/2018  . Unstable angina ([place])   . Encounter for anticoagulation discussion and counseling   . C. difficile colitis 01/14/2018  . GIB (gastrointestinal bleeding) 01/08/2018  . SOB (shortness of breath) 11/01/2017  . Influenza A 10/29/2017  . Acute respiratory failure ([place]) 09/24/2017  . Sepsis ([place]) 07/03/2017  . COPD (chronic obstructive pulmonary disease) ([place]) 04/26/2017  . Community acquired pneumonia 04/25/2017  . Chronic systolic heart failure ([place]) 02/03/2017  . Hypotension 02/03/2017  . Chest pain 12/09/2016  .  RLS (restless legs syndrome) 12/09/2016  . Cough, persistent 11/22/2015  . Hypokalemia 11/22/2015  . Leukocytosis 11/22/2015  . Dyspnea 11/21/2015  . Respiratory distress 09/19/2015  . Breast pain 01/11/2013  . Hx MRSA infection   . Lump or mass in breast   . GOITER, MULTINODULAR 01/20/2009  . HYPOGLYCEMIA, UNSPECIFIED 01/20/2009  . GERD 01/20/2009  . DIVERTICULITIS OF COLON 01/20/2009    Past Surgical History:  Procedure Laterality Date  . ABDOMINAL HYSTERECTOMY     age 66  . CARDIAC DEFIBRILLATOR PLACEMENT    . CESAREAN SECTION    . CHOLECYSTECTOMY    . COLECTOMY    . COLONOSCOPY WITH PROPOFOL N/A 02/22/2018   Procedure: COLONOSCOPY WITH PROPOFOL;  Surgeon: Toledo, Benay Pike, MD;  Location: [facility] ENDOSCOPY;  Service: Gastroenterology;  Laterality: N/A;  . ESOPHAGOGASTRODUODENOSCOPY (EGD) WITH PROPOFOL N/A 02/22/2018   Procedure: ESOPHAGOGASTRODUODENOSCOPY (EGD) WITH PROPOFOL;  Surgeon: Toledo, Benay Pike, MD;  Location: [facility] ENDOSCOPY;  Service: Gastroenterology;  Laterality: N/A;  . NASAL SINUS SURGERY  2012     OB History    Gravida  1   Para  1   Term      Preterm      AB      Living  1     SAB      TAB      Ectopic  Multiple      Live Births           Obstetric Comments  First Menstrual Period at age 73 First pregnancy at age 43         Home Medications    Prior to Admission medications   Medication Sig Start Date End Date Taking? Authorizing Provider  acetaminophen (TYLENOL) 325 MG tablet Take 2 tablets (650 mg total) by mouth every 6 (six) hours as needed for mild pain (or Fever >/= 101). 11/01/17   Gouru, Illene Silver, MD  albuterol (ACCUNEB) 1.25 MG/3ML nebulizer solution Take 3 mLs by nebulization every 6 (six) hours as needed for wheezing. 11/09/17   [provider]  beclomethasone (QVAR) 80 MCG/ACT inhaler Inhale 2 puffs into the lungs 2 (two) times daily. 06/05/17 06/05/18  Laverle Hobby, MD  calcium acetate (PHOSLO) 667 MG  capsule Take 50 mg by mouth 2 (two) times daily between meals.    [provider]  calcium carbonate (TUMS - DOSED IN MG ELEMENTAL CALCIUM) 500 MG chewable tablet Chew 4 tablets by mouth 3 (three) times daily.    [provider]  cyanocobalamin 500 MCG tablet Take 500 mcg by mouth daily.    [provider]  dicyclomine (BENTYL) 10 MG capsule Take 10 mg by mouth 3 (three) times daily as needed (abdominal pain).  12/29/17   [provider]  ferrous sulfate 325 (65 FE) MG tablet Take 325 mg by mouth daily with breakfast.    [provider]  hyoscyamine (LEVSIN SL) 0.125 MG SL tablet Place 1 tablet under the tongue every 6 (six) hours as needed for diarrhea or loose stools. 02/13/18   [provider]  levothyroxine (SYNTHROID, LEVOTHROID) 150 MCG tablet Take 150 mcg by mouth daily before breakfast.    [provider]  losartan (COZAAR) 25 MG tablet Take 12.5 mg by mouth 2 (two) times daily.    [provider]  metoCLOPramide (REGLAN) 10 MG tablet Take 1 tablet (10 mg total) by mouth every 8 (eight) hours as needed for up to 5 days for nausea or vomiting. 03/28/18 04/02/18  Arta Silence, MD  metoprolol succinate (TOPROL-XL) 100 MG 24 hr tablet Take 1 tablet (100 mg total) by mouth daily. Take with or immediately following a meal. 01/16/18   Loletha Grayer, MD  Multiple Vitamin (MULTIVITAMIN WITH MINERALS) TABS tablet Take 1 tablet by mouth daily.    [provider]  nitroGLYCERIN (NITROSTAT) 0.4 MG SL tablet Place 1 tablet (0.4 mg total) under the tongue every 5 (five) minutes as needed for chest pain. 01/24/17   Henreitta Leber, MD  oxyCODONE (ROXICODONE) 5 MG immediate release tablet Take 1 tablet (5 mg total) by mouth every 6 (six) hours as needed. 04/29/18 04/29/19  Duanne Guess, PA-C  pantoprazole (PROTONIX) 40 MG tablet Take 1 tablet (40 mg total) by mouth daily. 02/01/18   Saundra Shelling, MD  predniSONE (DELTASONE)  10 MG tablet Take 1 tablet (10 mg total) by mouth daily. 6,5,4,3,2,1 six day taper 04/29/18   Duanne Guess, PA-C  prochlorperazine (COMPAZINE) 10 MG tablet Take 1 tablet (10 mg total) by mouth every 6 (six) hours as needed for nausea or vomiting. Patient not taking: Reported on 01/29/2018 01/16/18   Loletha Grayer, MD  promethazine (PHENERGAN) 12.5 MG tablet Take 1 tablet (12.5 mg total) by mouth every 6 (six) hours as needed for nausea or vomiting. 04/29/18   Duanne Guess, PA-C  rOPINIRole (REQUIP) 2 MG  tablet Take 2 tablets (4 mg total) by mouth at bedtime. 2-4  Mg per night Patient taking differently: Take 4 mg by mouth at bedtime.  01/16/18   Loletha Grayer, MD  sertraline (ZOLOFT) 50 MG tablet Take 50 mg by mouth daily.    [provider]  spironolactone (ALDACTONE) 50 MG tablet Take 50 mg by mouth daily.    [provider]  traMADol (ULTRAM) 50 MG tablet Take 1 tablet (50 mg total) by mouth every 6 (six) hours as needed for moderate pain or severe pain. 02/01/18   Saundra Shelling, MD    Family History Family History  Problem Relation Age of Onset  . Cancer Mother 30       ovarian  . CAD Mother   . Cancer Father 41       brain  . CAD Father   . Cancer Daughter 18       skin  . Cancer Maternal Aunt 90       breast  . Leukemia Paternal Grandfather     Social History Social History   Tobacco Use  . Smoking status: Never Smoker  . Smokeless tobacco: Never Used  Substance Use Topics  . Alcohol use: No  . Drug use: No     Allergies   Contrast media [iodinated diagnostic agents]; Metrizamide; Penicillins; Isosorbide nitrate; Latex; Ondansetron; Zofran [ondansetron hcl]; Povidone-iodine; and Pulmicort [budesonide]   Review of Systems Review of Systems  Constitutional: Negative for fever.  Respiratory: Negative for shortness of breath.   Cardiovascular: Negative for chest pain.  Gastrointestinal: Negative for abdominal pain.  Genitourinary: Negative  for difficulty urinating, dysuria and urgency.  Musculoskeletal: Positive for arthralgias, gait problem and joint swelling. Negative for back pain, myalgias and neck pain.  Skin: Negative for rash and wound.  Neurological: Negative for dizziness and headaches.     Physical Exam Updated Vital Signs BP 103/69 (BP Location: Right Arm)   Pulse 95   Temp 98.6 F (37 C) (Oral)   Resp 18   Ht 5\' 5"  (1.651 m)   Wt 99.3 kg   LMP 01/22/2012 (Approximate) Comment: Hysterectomy 5 years ago  SpO2 97%   BMI 36.44 kg/m   Physical Exam  Constitutional: She is oriented to person, place, and time. She appears well-developed and well-nourished.  HENT:  Head: Normocephalic and atraumatic.  Right Ear: External ear normal.  Left Ear: External ear normal.  Eyes: Conjunctivae are normal.  Neck: Normal range of motion.  Cardiovascular: Normal rate.  Pulmonary/Chest: Effort normal. No respiratory distress.  Musculoskeletal:  Examination of the right lower extremity shows patient has good internal and external rotation of the right hip with no discomfort.  She is nontender to palpation throughout the groin.  Patient has swelling throughout the knee that is mild with very minimal effusion.  She is able to straight leg raise with no palpable defect along the quads tendon, patellar tendon or patella.  She has pain with varus stress testing as well as valgus stress testing but no significant laxity with ligamentous stressing.  Patient has no swelling or edema throughout the calf.  Patient is tender palpation along medial tibial plateau.  Neurological: She is alert and oriented to person, place, and time.  Skin: Skin is warm. No rash noted.  Psychiatric: She has a normal mood and affect. Her behavior is normal. Judgment and thought content normal.  Nursing note and vitals reviewed.    ED Treatments / Results  Labs (all labs ordered  are listed, but only abnormal results are displayed) Labs Reviewed - No  data to display  EKG None  Radiology Dg Knee Complete 4 Views Right  Result Date: 04/29/2018 CLINICAL DATA:  Chronic knee pain. Acutely worsened after falling today. EXAM: RIGHT KNEE - COMPLETE 4+ VIEW COMPARISON:  None. FINDINGS: No joint effusion. No fracture or dislocation. Medial compartment osteoarthritis with joint space narrowing and marginal osteophytes. IMPRESSION: Medial compartment osteoarthritis.  No acute finding. Electronically Signed   By: Nelson Chimes M.D.   On: 04/29/2018 15:13    Procedures Procedures (including critical care time)  Medications Ordered in ED Medications  oxyCODONE-acetaminophen (PERCOCET/ROXICET) 5-325 MG per tablet 1 tablet (has no administration in time range)  predniSONE (DELTASONE) tablet 60 mg (has no administration in time range)  promethazine (PHENERGAN) tablet 12.5 mg (12.5 mg Oral Given 04/29/18 1448)  morphine 4 MG/ML injection 4 mg (4 mg Intramuscular Given 04/29/18 1447)     Initial Impression / Assessment and Plan / ED Course  I have reviewed the triage vital signs and the nursing notes.  Pertinent labs & imaging results that were available during my care of the patient were reviewed by me and considered in my medical decision making (see chart for details).     52 year old female with fall earlier today injuring her right knee.  Based on range of motion of the hip with no tenderness palpation of the groin no suspicion for proximal femur fracture.  Most of her pain and swelling is throughout the knee.  She is tender to palpation throughout the knee.  X-rays of the knee show no evidence of acute bony normality but significant medial compartment osteoarthritis.  On exam no ligamentous laxity or evidence of tendon rupture.  Patient is placed into a knee immobilizer, given crutches to help with ambulation.  She will rest ice and elevate the knee.  She is given a prescription for prednisone, oxycodone for pain.  She is given Phenergan to help  with nausea which occurs with narcotics.  Patient will call orthopedic office tomorrow.  She understands signs symptoms return to ED for.  Final Clinical Impressions(s) / ED Diagnoses   Final diagnoses:  Fall in home, initial encounter  Acute pain of right knee  Primary osteoarthritis of right knee    ED Discharge Orders         Ordered    oxyCODONE (ROXICODONE) 5 MG immediate release tablet  Every 6 hours PRN     04/29/18 1541    promethazine (PHENERGAN) 12.5 MG tablet  Every 6 hours PRN     04/29/18 1541    predniSONE (DELTASONE) 10 MG tablet  Daily     04/29/18 1541           Renata Caprice 04/29/18 1554    Orbie Pyo, MD 04/30/18 1536

## 2018-04-29 NOTE — Discharge Instructions (Addendum)
Please take medications as prescribed as needed for pain.  Please rest ice and elevate the right knee.  Use crutches as needed for ambulation until you can ambulate without a limp.  Use knee immobilizer as needed for comfort.  Call orthopedic office tomorrow morning to schedule follow-up appointment.

## 2018-05-04 ENCOUNTER — Encounter: Payer: Self-pay | Admitting: Emergency Medicine

## 2018-05-04 ENCOUNTER — Other Ambulatory Visit: Payer: Self-pay

## 2018-05-04 ENCOUNTER — Emergency Department
Admission: EM | Admit: 2018-05-04 | Discharge: 2018-05-04 | Disposition: A | Discharge status: Home / Self Care | Payer: Medicaid Other | Attending: Emergency Medicine | Admitting: Emergency Medicine

## 2018-05-04 ENCOUNTER — Emergency Department: Payer: Medicaid Other

## 2018-05-04 DIAGNOSIS — Z79899 Other long term (current) drug therapy: Secondary | ICD-10-CM | POA: Diagnosis not present

## 2018-05-04 DIAGNOSIS — R079 Chest pain, unspecified: Secondary | ICD-10-CM

## 2018-05-04 DIAGNOSIS — I5022 Chronic systolic (congestive) heart failure: Secondary | ICD-10-CM | POA: Diagnosis not present

## 2018-05-04 DIAGNOSIS — R51 Headache: Secondary | ICD-10-CM | POA: Insufficient documentation

## 2018-05-04 DIAGNOSIS — R519 Headache, unspecified: Secondary | ICD-10-CM

## 2018-05-04 DIAGNOSIS — E876 Hypokalemia: Secondary | ICD-10-CM

## 2018-05-04 DIAGNOSIS — J449 Chronic obstructive pulmonary disease, unspecified: Secondary | ICD-10-CM | POA: Diagnosis not present

## 2018-05-04 LAB — HEPATIC FUNCTION PANEL
ALT: 27 U/L (ref 0–44)
AST: 33 U/L (ref 15–41)
Albumin: 4.1 g/dL (ref 3.5–5.0)
Alkaline Phosphatase: 105 U/L (ref 38–126)
BILIRUBIN TOTAL: 0.5 mg/dL (ref 0.3–1.2)
Total Protein: 7.9 g/dL (ref 6.5–8.1)

## 2018-05-04 LAB — BASIC METABOLIC PANEL
ANION GAP: 11 (ref 5–15)
BUN: 17 mg/dL (ref 6–20)
CALCIUM: 9 mg/dL (ref 8.9–10.3)
CO2: 30 mmol/L (ref 22–32)
CREATININE: 0.95 mg/dL (ref 0.44–1.00)
Chloride: 95 mmol/L — ABNORMAL LOW (ref 98–111)
GFR calc Af Amer: >60 mL/min (ref >60)
GFR calc non Af Amer: >60 mL/min (ref >60)
GLUCOSE: 161 mg/dL — AB (ref 70–99)
Potassium: 2.6 mmol/L — CL (ref 3.5–5.1)
Sodium: 136 mmol/L (ref 135–145)

## 2018-05-04 LAB — CBC
HCT: 42.2 % (ref 35.0–47.0)
HEMOGLOBIN: 14.9 g/dL (ref 12.0–16.0)
MCH: 29 pg (ref 26.0–34.0)
MCHC: 35.3 g/dL (ref 32.0–36.0)
MCV: 82.3 fL (ref 80.0–100.0)
Platelets: 294 10*3/uL (ref 150–440)
RBC: 5.12 MIL/uL (ref 3.80–5.20)
RDW: 19.1 % — ABNORMAL HIGH (ref 11.5–14.5)
WBC: 12.4 10*3/uL — ABNORMAL HIGH (ref 3.6–11.0)

## 2018-05-04 LAB — TROPONIN I: Troponin I: <0.03 ng/mL (ref <0.03)

## 2018-05-04 LAB — MAGNESIUM: MAGNESIUM: 2.1 mg/dL (ref 1.7–2.4)

## 2018-05-04 LAB — LIPASE, BLOOD: Lipase: 32 U/L (ref 11–51)

## 2018-05-04 MED ORDER — METOCLOPRAMIDE HCL 5 MG/ML IJ SOLN
10.0000 mg | Freq: Once | INTRAMUSCULAR | Status: AC
  Administered 2018-05-04: 10 mg via INTRAVENOUS
  Filled 2018-05-04: qty 2

## 2018-05-04 MED ORDER — POTASSIUM CHLORIDE CRYS ER 20 MEQ PO TBCR
40.0000 meq | EXTENDED_RELEASE_TABLET | Freq: Once | ORAL | Status: AC
  Administered 2018-05-04: 40 meq via ORAL
  Filled 2018-05-04: qty 2

## 2018-05-04 MED ORDER — KETOROLAC TROMETHAMINE 30 MG/ML IJ SOLN
15.0000 mg | Freq: Once | INTRAMUSCULAR | Status: AC
  Administered 2018-05-04: 15 mg via INTRAVENOUS
  Filled 2018-05-04: qty 1

## 2018-05-04 MED ORDER — BUTALBITAL-APAP-CAFFEINE 50-325-40 MG PO TABS: 1.0000 | ORAL_TABLET | Freq: Four times a day (QID) | ORAL | 0 refills | Status: DC | PRN

## 2018-05-04 MED ORDER — BUTALBITAL-APAP-CAFFEINE 50-325-40 MG PO TABS
1.0000 | ORAL_TABLET | Freq: Once | ORAL | Status: AC
  Administered 2018-05-04: 1 via ORAL
  Filled 2018-05-04: qty 1

## 2018-05-04 MED ORDER — METOCLOPRAMIDE HCL 10 MG PO TABS: 10.0000 mg | ORAL_TABLET | Freq: Four times a day (QID) | ORAL | 0 refills | Status: DC | PRN

## 2018-05-04 MED ORDER — FAMOTIDINE IN NACL 20-0.9 MG/50ML-% IV SOLN
20.0000 mg | Freq: Once | INTRAVENOUS | Status: AC
  Administered 2018-05-04: 20 mg via INTRAVENOUS
  Filled 2018-05-04: qty 50

## 2018-05-04 MED ORDER — GI COCKTAIL ~~LOC~~
30.0000 mL | Freq: Once | ORAL | Status: AC
  Administered 2018-05-04: 30 mL via ORAL
  Filled 2018-05-04: qty 30

## 2018-05-04 MED ORDER — SODIUM CHLORIDE 0.9 % IV BOLUS
1000.0000 mL | Freq: Once | INTRAVENOUS | Status: AC
  Administered 2018-05-04: 1000 mL via INTRAVENOUS

## 2018-05-04 MED ORDER — POTASSIUM CHLORIDE 10 MEQ/100ML IV SOLN
10.0000 meq | Freq: Once | INTRAVENOUS | Status: AC
  Administered 2018-05-04: 10 meq via INTRAVENOUS
  Filled 2018-05-04: qty 100

## 2018-05-04 NOTE — ED Provider Notes (Signed)
[facility] Emergency Department Provider Note  ____________________________________________  Time seen: Approximately 2:31 PM  I have reviewed the triage vital signs and the nursing notes.   HISTORY  Chief Complaint Migraine and Chest Pain   HPI Brittany Nguyen is a 52 y.o. female with a history of CHF, COPD, anemia who presents for evaluation of headache and chest pain.  Patient reports that she woke up with a headache yesterday morning.  The headache was mild and became severe throughout the day.  Is complaining of severe constant pressure-like headache located in the top of her head.  She feels like somebody squeezing her head.  She has had blurry vision, photophobia, and several episodes of nonbloody nonbilious emesis.  She reports that all of the symptoms are similar to her prior migraine headaches however usually the pain is unilateral and this time it is bilateral.  She reports having a fall from a stool at home a week ago but does not remember hitting her head.  She is not on any blood thinners.  She denies slurred speech, facial droop, difficulty finding words, unilateral weakness or numbness, gait instability.  She reports that after vomiting several times earlier today she started having chest pain that she describes as sharp, located in the center of her chest, constant, radiating to her shoulder blade bilaterally.  The pain is not pleuritic in nature.  She has no shortness of breath.  No leg pain or swelling, no hemoptysis.  Past Medical History:  Diagnosis Date  . Asthma 2013  . Breast cancer ([place]) 2014   right side  . CHF (congestive heart failure) ([place])   . Diverticulitis 2010  . DVT (deep venous thrombosis) ([place])   . Endometriosis 1990  . Heart disease 2013  . Hx MRSA infection   . Lump or mass in breast   . Restless leg     Patient Active Problem List   Diagnosis Date Noted  . Iron deficiency anemia 03/27/2018  . Anemia 03/26/2018    . Abdominal pain 02/20/2018  . Unstable angina ([place])   . Encounter for anticoagulation discussion and counseling   . C. difficile colitis 01/14/2018  . GIB (gastrointestinal bleeding) 01/08/2018  . SOB (shortness of breath) 11/01/2017  . Influenza A 10/29/2017  . Acute respiratory failure ([place]) 09/24/2017  . Sepsis ([place]) 07/03/2017  . COPD (chronic obstructive pulmonary disease) ([place]) 04/26/2017  . Community acquired pneumonia 04/25/2017  . Chronic systolic heart failure ([place]) 02/03/2017  . Hypotension 02/03/2017  . Chest pain 12/09/2016  . RLS (restless legs syndrome) 12/09/2016  . Cough, persistent 11/22/2015  . Hypokalemia 11/22/2015  . Leukocytosis 11/22/2015  . Dyspnea 11/21/2015  . Respiratory distress 09/19/2015  . Breast pain 01/11/2013  . Hx MRSA infection   . Lump or mass in breast   . GOITER, MULTINODULAR 01/20/2009  . HYPOGLYCEMIA, UNSPECIFIED 01/20/2009  . GERD 01/20/2009  . DIVERTICULITIS OF COLON 01/20/2009    Past Surgical History:  Procedure Laterality Date  . ABDOMINAL HYSTERECTOMY     age 42  . CARDIAC DEFIBRILLATOR PLACEMENT    . CESAREAN SECTION    . CHOLECYSTECTOMY    . COLECTOMY    . COLONOSCOPY WITH PROPOFOL N/A 02/22/2018   Procedure: COLONOSCOPY WITH PROPOFOL;  Surgeon: Toledo, Benay Pike, MD;  Location: [facility] ENDOSCOPY;  Service: Gastroenterology;  Laterality: N/A;  . ESOPHAGOGASTRODUODENOSCOPY (EGD) WITH PROPOFOL N/A 02/22/2018   Procedure: ESOPHAGOGASTRODUODENOSCOPY (EGD) WITH PROPOFOL;  Surgeon: Alice Reichert, Benay Pike, MD;  Location: [facility]  ENDOSCOPY;  Service: Gastroenterology;  Laterality: N/A;  . NASAL SINUS SURGERY  2012    Prior to Admission medications   Medication Sig Start Date End Date Taking? Authorizing Provider  acetaminophen (TYLENOL) 325 MG tablet Take 2 tablets (650 mg total) by mouth every 6 (six) hours as needed for mild pain (or Fever >/= 101). 11/01/17   Gouru, Illene Silver, MD  albuterol (ACCUNEB) 1.25 MG/3ML nebulizer solution Take 3  mLs by nebulization every 6 (six) hours as needed for wheezing. 11/09/17   [provider]  beclomethasone (QVAR) 80 MCG/ACT inhaler Inhale 2 puffs into the lungs 2 (two) times daily. 06/05/17 06/05/18  Laverle Hobby, MD  calcium acetate (PHOSLO) 667 MG capsule Take 50 mg by mouth 2 (two) times daily between meals.    [provider]  calcium carbonate (TUMS - DOSED IN MG ELEMENTAL CALCIUM) 500 MG chewable tablet Chew 4 tablets by mouth 3 (three) times daily.    [provider]  cyanocobalamin 500 MCG tablet Take 500 mcg by mouth daily.    [provider]  dicyclomine (BENTYL) 10 MG capsule Take 10 mg by mouth 3 (three) times daily as needed (abdominal pain).  12/29/17   [provider]  ferrous sulfate 325 (65 FE) MG tablet Take 325 mg by mouth daily with breakfast.    [provider]  hyoscyamine (LEVSIN SL) 0.125 MG SL tablet Place 1 tablet under the tongue every 6 (six) hours as needed for diarrhea or loose stools. 02/13/18   [provider]  levothyroxine (SYNTHROID, LEVOTHROID) 150 MCG tablet Take 150 mcg by mouth daily before breakfast.    [provider]  losartan (COZAAR) 25 MG tablet Take 12.5 mg by mouth 2 (two) times daily.    [provider]  metoCLOPramide (REGLAN) 10 MG tablet Take 1 tablet (10 mg total) by mouth every 8 (eight) hours as needed for up to 5 days for nausea or vomiting. 03/28/18 04/02/18  Arta Silence, MD  metoprolol succinate (TOPROL-XL) 100 MG 24 hr tablet Take 1 tablet (100 mg total) by mouth daily. Take with or immediately following a meal. 01/16/18   Loletha Grayer, MD  Multiple Vitamin (MULTIVITAMIN WITH MINERALS) TABS tablet Take 1 tablet by mouth daily.    [provider]  nitroGLYCERIN (NITROSTAT) 0.4 MG SL tablet Place 1 tablet (0.4 mg total) under the tongue every 5 (five) minutes as needed for chest pain. 01/24/17   Henreitta Leber, MD  oxyCODONE (ROXICODONE) 5 MG  immediate release tablet Take 1 tablet (5 mg total) by mouth every 6 (six) hours as needed. 04/29/18 04/29/19  Duanne Guess, PA-C  pantoprazole (PROTONIX) 40 MG tablet Take 1 tablet (40 mg total) by mouth daily. 02/01/18   Saundra Shelling, MD  predniSONE (DELTASONE) 10 MG tablet Take 1 tablet (10 mg total) by mouth daily. 6,5,4,3,2,1 six day taper 04/29/18   Duanne Guess, PA-C  prochlorperazine (COMPAZINE) 10 MG tablet Take 1 tablet (10 mg total) by mouth every 6 (six) hours as needed for nausea or vomiting. Patient not taking: Reported on 01/29/2018 01/16/18   Loletha Grayer, MD  promethazine (PHENERGAN) 12.5 MG tablet Take 1 tablet (12.5 mg total) by mouth every 6 (six) hours as needed for nausea or vomiting. 04/29/18   Duanne Guess, PA-C  rOPINIRole (REQUIP) 2 MG tablet Take 2 tablets (4 mg total) by mouth at bedtime. 2-4  Mg per night Patient taking differently: Take 4 mg by mouth at bedtime.  01/16/18  Loletha Grayer, MD  sertraline (ZOLOFT) 50 MG tablet Take 50 mg by mouth daily.    [provider]  spironolactone (ALDACTONE) 50 MG tablet Take 50 mg by mouth daily.    [provider]  traMADol (ULTRAM) 50 MG tablet Take 1 tablet (50 mg total) by mouth every 6 (six) hours as needed for moderate pain or severe pain. 02/01/18   Saundra Shelling, MD    Allergies Contrast media [iodinated diagnostic agents]; Metrizamide; Penicillins; Isosorbide nitrate; Latex; Ondansetron; Zofran [ondansetron hcl]; Povidone-iodine; and Pulmicort [budesonide]  Family History  Problem Relation Age of Onset  . Cancer Mother 30       ovarian  . CAD Mother   . Cancer Father 47       brain  . CAD Father   . Cancer Daughter 18       skin  . Cancer Maternal Aunt 60       breast  . Leukemia Paternal Grandfather     Social History Social History   Tobacco Use  . Smoking status: Never Smoker  . Smokeless tobacco: Never Used  Substance Use Topics  . Alcohol use: No  . Drug use: No      Review of Systems  Constitutional: Negative for fever. Eyes: Negative for visual changes. ENT: Negative for sore throat. Neck: No neck pain  Cardiovascular: + chest pain. Respiratory: Negative for shortness of breath. Gastrointestinal: Negative for abdominal pain or diarrhea. + N/V Genitourinary: Negative for dysuria. Musculoskeletal: Negative for back pain. Skin: Negative for rash. Neurological: Negative for weakness or numbness. + HA, photophobia Psych: No SI or HI  ____________________________________________   PHYSICAL EXAM:  VITAL SIGNS: ED Triage Vitals  Enc Vitals Group     BP 05/04/18 1113 (!) 119/93     Pulse Rate 05/04/18 1113 (!) 103     Resp --      Temp 05/04/18 1113 98.3 F (36.8 C)     Temp Source 05/04/18 1113 Oral     SpO2 05/04/18 1113 96 %     Weight 05/04/18 1118 220 lb (99.8 kg)     Height 05/04/18 1118 5\' 5"  (1.651 m)     Head Circumference --      Peak Flow --      Pain Score 05/04/18 1118 8     Pain Loc --      Pain Edu? --      Excl. in [place]? --     Constitutional: Alert and oriented. Well appearing and in no apparent distress. HEENT:      Head: Normocephalic and atraumatic.         Eyes: Conjunctivae are normal. Sclera is non-icteric.       Mouth/Throat: Mucous membranes are moist.       Neck: Supple with no signs of meningismus. Cardiovascular: Tachycardia with regular rhythm. No murmurs, gallops, or rubs. 2+ symmetrical distal pulses are present in all extremities. No JVD. Respiratory: Normal respiratory effort. Lungs are clear to auscultation bilaterally. No wheezes, crackles, or rhonchi.  Gastrointestinal: Soft, non tender, and non distended with positive bowel sounds. No rebound or guarding. Musculoskeletal: Nontender with normal range of motion in all extremities. No edema, cyanosis, or erythema of extremities. Neurologic: Normal speech and language. A & O x3, PERRL, EOMI, no nystagmus, CN II-XII intact, motor testing reveals good  tone and bulk throughout. There is no evidence of pronator drift or dysmetria. Muscle strength is 5/5 throughout.  Sensory examination is intact. Gait is deferred  Skin: Skin is warm, dry and intact. No rash noted. Psychiatric: Mood and affect are normal. Speech and behavior are normal.  ____________________________________________   LABS (all labs ordered are listed, but only abnormal results are displayed)  Labs Reviewed  BASIC METABOLIC PANEL - Abnormal; Notable for the following components:      Result Value   Potassium 2.6 (*)    Chloride 95 (*)    Glucose, Bld 161 (*)    All other components within normal limits  CBC - Abnormal; Notable for the following components:   WBC 12.4 (*)    RDW 19.1 (*)    All other components within normal limits  TROPONIN I  MAGNESIUM  HEPATIC FUNCTION PANEL  LIPASE, BLOOD  TROPONIN I   ____________________________________________  EKG  ED ECG REPORT I, Rudene Re, the attending physician, personally viewed and interpreted this ECG.  Normal sinus rhythm, rate of 101, normal intervals, left axis deviation, T wave inversions in anterior and lateral leads, no STE or depression. Unchanged from prior ____________________________________________  RADIOLOGY  I have personally reviewed the images performed during this visit and I agree with the Radiologist's read.   Interpretation by Radiologist:  Dg Chest 2 View  Result Date: 05/04/2018 CLINICAL DATA:  Chest pain. EXAM: CHEST - 2 VIEW COMPARISON:  Radiographs of April 04, 2018. FINDINGS: The heart size and mediastinal contours are within normal limits. Both lungs are clear. Left-sided pacemaker is unchanged in position. No pneumothorax or pleural effusion is noted. The visualized skeletal structures are unremarkable. IMPRESSION: No active cardiopulmonary disease. Electronically Signed   By: Marijo Conception, M.D.   On: 05/04/2018 11:53   Ct Head Wo Contrast  Result Date:  05/04/2018 CLINICAL DATA:  Acute onset headache EXAM: CT HEAD WITHOUT CONTRAST TECHNIQUE: Contiguous axial images were obtained from the base of the skull through the vertex without intravenous contrast. COMPARISON:  Head CT 12/08/2014 FINDINGS: Brain: There is no mass, hemorrhage or extra-axial collection. The size and configuration of the ventricles and extra-axial CSF spaces are normal. There is no acute or chronic infarction. The brain parenchyma is normal. Vascular: No abnormal hyperdensity of the major intracranial arteries or dural venous sinuses. No intracranial atherosclerosis. Skull: The visualized skull base, calvarium and extracranial soft tissues are normal. Sinuses/Orbits: No fluid levels or advanced mucosal thickening of the visualized paranasal sinuses. No mastoid or middle ear effusion. The orbits are normal. Other: None IMPRESSION: Normal head CT. Electronically Signed   By: Ulyses Jarred M.D.   On: 05/04/2018 13:57     ____________________________________________   PROCEDURES  Procedure(s) performed: None Procedures Critical Care performed:  None ____________________________________________   INITIAL IMPRESSION / ASSESSMENT AND PLAN / ED COURSE  52 y.o. female with a history of CHF, COPD, anemia who presents for evaluation of headache and chest pain.   #HA: Due to recent fall and atypical bilateral headache (patient's typical migraines are unilateral) CT head was done which does not show any acute abnormalities.  Patient is otherwise completely neurologically intact with no signs of stroke.  No thunderclap headache concerning for subarachnoid hemorrhage.  No fever or meningeal signs concerning for meningitis.  Will treat with a migraine cocktail including IV fluids, Benadryl, Reglan, Toradol.   #CP: Patient with sharp chest pain radiating to shoulder blades that started after several episodes of vomiting.  Concerning for GI pathology.  Chest x-ray with no evidence of  pneumomediastinum or esophageal perforation.  EKG is unchanged from baseline.  Due to patient's comorbidities  will monitor and get 2 sets of cardiac enzymes.  Will provide patient with a GI cocktail and IV pepcid.   Clinical Course as of May 04 1602  Fri May 04, 2018  1601 Labs showing hypokalemia which was supplemented. Troponin x 2 negative. CP resolved after GI cocktail. Patient reports HA is now 5/10, CT negative. Will give 2nd dose of toradol and fioricet. I asked Dr. Kerman Passey to reassess patient in 1 hour.    [CV]    Clinical Course User Index [CV] Rudene Re, MD    #Hypokalemia: Patient with h/o such on PO supplementation. No EKG changes, will give IV and PO K. Most likely due to several episodes of vomiting.    As part of my medical decision making, I reviewed the following data within the [place] notes reviewed and incorporated, Labs reviewed , EKG interpreted , Old EKG reviewed, Old chart reviewed, Radiograph reviewed , Notes from prior ED visits and [place] Controlled Substance Database    Pertinent labs & imaging results that were available during my care of the patient were reviewed by me and considered in my medical decision making (see chart for details).    ____________________________________________   FINAL CLINICAL IMPRESSION(S) / ED DIAGNOSES  Final diagnoses:  Acute nonintractable headache, unspecified headache type  Chest pain, unspecified type  Hypokalemia      NEW MEDICATIONS STARTED DURING THIS VISIT:  ED Discharge Orders    None       Note:  This document was prepared using Dragon voice recognition software and may include unintentional dictation errors.    Alfred Levins, [place], MD 05/04/18 [phone]

## 2018-05-04 NOTE — Discharge Instructions (Addendum)
You have been seen in the Emergency Department (ED) for a headache. Your evaluation today was overall reassuring. Headaches have many possible causes. Most headaches aren't a sign of a more serious problem, and they will get better on their own.   Follow-up with your doctor in 12-24 hours if you are still having a headache. Otherwise follow up with your doctor in 3-5 days, please have your potassium level rechecked at that time.  For pain take tylenol or ibuprofen  When should you call for help?  Call 911 or return to the ED anytime you think you may need emergency care. For example, call if:  You have signs of a stroke. These may include:  Sudden numbness, paralysis, or weakness in your face, arm, or leg, especially on only one side of your body.  Sudden vision changes.  Sudden trouble speaking.  Sudden confusion or trouble understanding simple statements.  Sudden problems with walking or balance.  A sudden, severe headache that is different from past headaches. You have new or worsening headache Nausea and vomiting associated with your headache Fever, neck stiffness associated with your headache  Call your doctor now or seek immediate medical care if:  You have a new or worse headache.  Your headache gets much worse.  How can you care for yourself at home?  Do not drive if you have taken a prescription pain medicine.  Rest in a quiet, dark room until your headache is gone. Close your eyes and try to relax or go to sleep. Don't watch TV or read.  Put a cold, moist cloth or cold pack on the painful area for 10 to 20 minutes at a time. Put a thin cloth between the cold pack and your skin.  Use a warm, moist towel or a heating pad set on low to relax tight shoulder and neck muscles.  Have someone gently massage your neck and shoulders.  Take pain medicines exactly as directed.  If the doctor gave you a prescription medicine for pain, take it as prescribed.  If you are not taking a  prescription pain medicine, ask your doctor if you can take an over-the-counter medicine. Be careful not to take pain medicine more often than the instructions allow, because you may get worse or more frequent headaches when the medicine wears off.  Do not ignore new symptoms that occur with a headache, such as a fever, weakness or numbness, vision changes, or confusion. These may be signs of a more serious problem.  To prevent headaches  Keep a headache diary so you can figure out what triggers your headaches. Avoiding triggers may help you prevent headaches. Record when each headache began, how long it lasted, and what the pain was like (throbbing, aching, stabbing, or dull). Write down any other symptoms you had with the headache, such as nausea, flashing lights or dark spots, or sensitivity to bright light or loud noise. Note if the headache occurred near your period. List anything that might have triggered the headache, such as certain foods (chocolate, cheese, wine) or odors, smoke, bright light, stress, or lack of sleep.  Find healthy ways to deal with stress. Headaches are most common during or right after stressful times. Take time to relax before and after you do something that has caused a headache in the past.  Try to keep your muscles relaxed by keeping good posture. Check your jaw, face, neck, and shoulder muscles for tension, and try relaxing them. When sitting at a desk, change  positions often, and stretch for 30 seconds each hour.  Get plenty of sleep and exercise.  Eat regularly and well. Long periods without food can trigger a headache.  Treat yourself to a massage. Some people find that regular massages are very helpful in relieving tension.  Limit caffeine by not drinking too much coffee, tea, or soda. But don't quit caffeine suddenly, because that can also give you headaches.  Reduce eyestrain from computers by blinking frequently and looking away from the computer screen every so  often. Make sure you have proper eyewear and that your monitor is set up properly, about an arm's length away.  Seek help if you have depression or anxiety. Your headaches may be linked to these conditions. Treatment can both prevent headaches and help with symptoms of anxiety or depression.

## 2018-05-04 NOTE — ED Triage Notes (Signed)
Pt here with c/o migraine that began yesterday has taken Excedrin migraine and aleve with no relief, states cp today radiating to shoulder blades in back, pt does have a pacemaker.

## 2018-05-04 NOTE — ED Provider Notes (Signed)
-----------------------------------------   4:48 PM on 05/04/2018 -----------------------------------------  Patient states her headache is much improved at this time.  Patient's labs are reassuring besides a lower potassium.  Patient is on potassium supplementation twice daily, this is likely due to nausea vomiting.  We will discharge patient with Fioricet and a Zofran prescription.  Patient states she will follow-up with her doctor next week to have her potassium level rechecked.  I believe the patient is safe for discharge home.  CT head normal. Chest x-ray normal.   Harvest Dark, MD 05/04/18 1649

## 2018-05-04 NOTE — ED Notes (Signed)
Patient transported to CT 

## 2018-05-04 NOTE — ED Notes (Signed)
Reassessed patient in sub waiting area.  Patient is AAOx3.  Skin warm and dry.  No SOB/ DOE.  C/O continued left chest pain.  Husband with patient who stated "You know she is a heart patient and I have seen at least 7 or 8 people march on back to a room and we are still sitting here".  Reassured that patient's are bedded in order of acuity and / arrival time.  Reassured that patient will be bedded as soon as we have a bed available.

## 2018-05-04 NOTE — ED Notes (Signed)
Opal Sidles, RN aware pts husband came back to ED to report pt having worsening CP.

## 2018-05-04 NOTE — ED Notes (Signed)
First Nurse Note: Patient also complaining of CP, taken to triage for EKG.

## 2018-05-13 ENCOUNTER — Emergency Department
Admission: EM | Admit: 2018-05-13 | Discharge: 2018-05-13 | Disposition: A | Discharge status: Home / Self Care | Payer: Medicaid Other | Attending: Emergency Medicine | Admitting: Emergency Medicine

## 2018-05-13 ENCOUNTER — Other Ambulatory Visit: Payer: Self-pay

## 2018-05-13 ENCOUNTER — Encounter: Payer: Self-pay | Admitting: Emergency Medicine

## 2018-05-13 ENCOUNTER — Emergency Department: Payer: Medicaid Other

## 2018-05-13 DIAGNOSIS — S8991XA Unspecified injury of right lower leg, initial encounter: Secondary | ICD-10-CM | POA: Diagnosis present

## 2018-05-13 DIAGNOSIS — M1711 Unilateral primary osteoarthritis, right knee: Secondary | ICD-10-CM | POA: Diagnosis not present

## 2018-05-13 DIAGNOSIS — Z853 Personal history of malignant neoplasm of breast: Secondary | ICD-10-CM | POA: Diagnosis not present

## 2018-05-13 DIAGNOSIS — Z9049 Acquired absence of other specified parts of digestive tract: Secondary | ICD-10-CM | POA: Insufficient documentation

## 2018-05-13 DIAGNOSIS — Y998 Other external cause status: Secondary | ICD-10-CM | POA: Insufficient documentation

## 2018-05-13 DIAGNOSIS — Z79899 Other long term (current) drug therapy: Secondary | ICD-10-CM | POA: Diagnosis not present

## 2018-05-13 DIAGNOSIS — W01198A Fall on same level from slipping, tripping and stumbling with subsequent striking against other object, initial encounter: Secondary | ICD-10-CM | POA: Insufficient documentation

## 2018-05-13 DIAGNOSIS — J45909 Unspecified asthma, uncomplicated: Secondary | ICD-10-CM | POA: Insufficient documentation

## 2018-05-13 DIAGNOSIS — Z91040 Latex allergy status: Secondary | ICD-10-CM | POA: Insufficient documentation

## 2018-05-13 DIAGNOSIS — Y92002 Bathroom of unspecified non-institutional (private) residence single-family (private) house as the place of occurrence of the external cause: Secondary | ICD-10-CM | POA: Insufficient documentation

## 2018-05-13 DIAGNOSIS — I5022 Chronic systolic (congestive) heart failure: Secondary | ICD-10-CM | POA: Diagnosis not present

## 2018-05-13 DIAGNOSIS — S86912A Strain of unspecified muscle(s) and tendon(s) at lower leg level, left leg, initial encounter: Secondary | ICD-10-CM

## 2018-05-13 DIAGNOSIS — Y93E1 Activity, personal bathing and showering: Secondary | ICD-10-CM | POA: Insufficient documentation

## 2018-05-13 MED ORDER — HYDROCODONE-ACETAMINOPHEN 5-325 MG PO TABS: 1.0000 | ORAL_TABLET | Freq: Once | ORAL | Status: DC

## 2018-05-13 MED ORDER — CYCLOBENZAPRINE HCL 10 MG PO TABS
10.0000 mg | ORAL_TABLET | Freq: Once | ORAL | Status: AC
  Administered 2018-05-13: 10 mg via ORAL
  Filled 2018-05-13: qty 1

## 2018-05-13 MED ORDER — TRAMADOL HCL 50 MG PO TABS: 100.0000 mg | ORAL_TABLET | Freq: Two times a day (BID) | ORAL | 0 refills | Status: AC

## 2018-05-13 MED ORDER — KETOROLAC TROMETHAMINE 30 MG/ML IJ SOLN
30.0000 mg | Freq: Once | INTRAMUSCULAR | Status: AC
  Administered 2018-05-13: 30 mg via INTRAMUSCULAR
  Filled 2018-05-13: qty 1

## 2018-05-13 MED ORDER — PREDNISONE 20 MG PO TABS: 20.0000 mg | ORAL_TABLET | Freq: Every day | ORAL | 0 refills | Status: AC

## 2018-05-13 MED ORDER — CYCLOBENZAPRINE HCL 10 MG PO TABS: 10.0000 mg | ORAL_TABLET | Freq: Three times a day (TID) | ORAL | 0 refills | Status: AC | PRN

## 2018-05-13 NOTE — ED Provider Notes (Signed)
[facility] Emergency Department Provider Note ____________________________________________  Time seen: 1021  I have reviewed the triage vital signs and the nursing notes.  HISTORY  Chief Complaint  Knee Pain  HPI Brittany Nguyen is a 52 y.o. female who presents to the ED accompanied by her husband, for evaluation of injury following a mechanical fall.  Patient with a history of COPD, CHF, anemia, presents after a slip and fall in the shower just prior to arrival.  Patient describes likely slipping on some shaving cream, when she apparently fell, landing with her right knee on the tub edge.  She denies any head injury, loss of consciousness, lacerations or abrasions.  She presents with pain to the right knee, as well as some pain to the lateral right ankle.  She also reports some muscle spasms along the right thigh.  She was treated and evaluated here about 2 weeks prior for mechanical understanding in a chair.  She apparently injured the right knee at that time, and x-rays were found to be negative for any acute fracture or dislocation.  She was placed in a knee immobilizer and given crutches to ambulate with WBAT.  Patient reports she is scheduled to see orthopedics in 1 month on October 7th.  Review of the chart for both [facility] and [place] is not correlate with an upcoming appointment.  Patient gives a remote history of an ACL tear on the right, that was never surgically repaired.  Past Medical History:  Diagnosis Date  . Asthma 2013  . Breast cancer ([place]) 2014   right side  . CHF (congestive heart failure) ([place])   . Diverticulitis 2010  . DVT (deep venous thrombosis) ([place])   . Endometriosis 1990  . Heart disease 2013  . Hx MRSA infection   . Lump or mass in breast   . Restless leg     Patient Active Problem List   Diagnosis Date Noted  . Iron deficiency anemia 03/27/2018  . Anemia 03/26/2018  . Abdominal pain 02/20/2018  . Unstable angina  ([place])   . Encounter for anticoagulation discussion and counseling   . C. difficile colitis 01/14/2018  . GIB (gastrointestinal bleeding) 01/08/2018  . SOB (shortness of breath) 11/01/2017  . Influenza A 10/29/2017  . Acute respiratory failure ([place]) 09/24/2017  . Sepsis ([place]) 07/03/2017  . COPD (chronic obstructive pulmonary disease) ([place]) 04/26/2017  . Community acquired pneumonia 04/25/2017  . Chronic systolic heart failure ([place]) 02/03/2017  . Hypotension 02/03/2017  . Chest pain 12/09/2016  . RLS (restless legs syndrome) 12/09/2016  . Cough, persistent 11/22/2015  . Hypokalemia 11/22/2015  . Leukocytosis 11/22/2015  . Dyspnea 11/21/2015  . Respiratory distress 09/19/2015  . Breast pain 01/11/2013  . Hx MRSA infection   . Lump or mass in breast   . GOITER, MULTINODULAR 01/20/2009  . HYPOGLYCEMIA, UNSPECIFIED 01/20/2009  . GERD 01/20/2009  . DIVERTICULITIS OF COLON 01/20/2009    Past Surgical History:  Procedure Laterality Date  . ABDOMINAL HYSTERECTOMY     age 89  . CARDIAC DEFIBRILLATOR PLACEMENT    . CESAREAN SECTION    . CHOLECYSTECTOMY    . COLECTOMY    . COLONOSCOPY WITH PROPOFOL N/A 02/22/2018   Procedure: COLONOSCOPY WITH PROPOFOL;  Surgeon: Toledo, Benay Pike, MD;  Location: [facility] ENDOSCOPY;  Service: Gastroenterology;  Laterality: N/A;  . ESOPHAGOGASTRODUODENOSCOPY (EGD) WITH PROPOFOL N/A 02/22/2018   Procedure: ESOPHAGOGASTRODUODENOSCOPY (EGD) WITH PROPOFOL;  Surgeon: Toledo, Benay Pike, MD;  Location: [facility] ENDOSCOPY;  Service:  Gastroenterology;  Laterality: N/A;  . NASAL SINUS SURGERY  2012    Prior to Admission medications   Medication Sig Start Date End Date Taking? Authorizing Provider  traMADol (ULTRAM) 50 MG tablet Take 50 mg by mouth every 6 (six) hours as needed.   Yes [provider]  albuterol (ACCUNEB) 1.25 MG/3ML nebulizer solution Take 3 mLs by nebulization every 6 (six) hours as needed for wheezing. 11/09/17   [provider]   beclomethasone (QVAR) 80 MCG/ACT inhaler Inhale 2 puffs into the lungs 2 (two) times daily. 06/05/17 06/05/18  Laverle Hobby, MD  butalbital-acetaminophen-caffeine (FIORICET, ESGIC) [phone] MG tablet Take 1-2 tablets by mouth every 6 (six) hours as needed for headache. 05/04/18 05/04/19  Harvest Dark, MD  cyanocobalamin 500 MCG tablet Take 500 mcg by mouth daily.    [provider]  cyclobenzaprine (FLEXERIL) 10 MG tablet Take 1 tablet (10 mg total) by mouth 3 (three) times daily as needed for up to 10 days for muscle spasms. 05/13/18 05/23/18  Treanna Dumler, Dannielle Karvonen, PA-C  dicyclomine (BENTYL) 10 MG capsule Take 10 mg by mouth 3 (three) times daily as needed (abdominal pain).  12/29/17   [provider]  ferrous sulfate 325 (65 FE) MG tablet Take 325 mg by mouth daily with breakfast.    [provider]  hyoscyamine (LEVSIN SL) 0.125 MG SL tablet Place 1 tablet under the tongue every 6 (six) hours as needed for diarrhea or loose stools. 02/13/18   [provider]  levothyroxine (SYNTHROID, LEVOTHROID) 150 MCG tablet Take 150 mcg by mouth daily before breakfast.    [provider]  losartan (COZAAR) 25 MG tablet Take 12.5 mg by mouth 2 (two) times daily.    [provider]  metoprolol succinate (TOPROL-XL) 100 MG 24 hr tablet Take 1 tablet (100 mg total) by mouth daily. Take with or immediately following a meal. 01/16/18   Loletha Grayer, MD  Multiple Vitamin (MULTIVITAMIN WITH MINERALS) TABS tablet Take 1 tablet by mouth daily.    [provider]  nitroGLYCERIN (NITROSTAT) 0.4 MG SL tablet Place 1 tablet (0.4 mg total) under the tongue every 5 (five) minutes as needed for chest pain. 01/24/17   Henreitta Leber, MD  pantoprazole (PROTONIX) 40 MG tablet Take 1 tablet (40 mg total) by mouth daily. 02/01/18   Saundra Shelling, MD  predniSONE (DELTASONE) 20 MG tablet Take 1 tablet (20 mg total) by mouth daily with breakfast for 7 days.  05/13/18 05/20/18  Alyanna Stoermer, Dannielle Karvonen, PA-C  rOPINIRole (REQUIP) 2 MG tablet Take 2 tablets (4 mg total) by mouth at bedtime. 2-4  Mg per night Patient taking differently: Take 4 mg by mouth at bedtime.  01/16/18   Loletha Grayer, MD  sertraline (ZOLOFT) 50 MG tablet Take 50 mg by mouth daily.    [provider]  spironolactone (ALDACTONE) 50 MG tablet Take 50 mg by mouth daily.    [provider]  traMADol (ULTRAM) 50 MG tablet Take 2 tablets (100 mg total) by mouth 2 (two) times daily for 3 days. 05/13/18 05/16/18  Norissa Bartee, Dannielle Karvonen, PA-C    Allergies Contrast media [iodinated diagnostic agents]; Metrizamide; Penicillins; Isosorbide nitrate; Latex; Ondansetron; Zofran [ondansetron hcl]; Povidone-iodine; and Pulmicort [budesonide]  Family History  Problem Relation Age of Onset  . Cancer Mother 30       ovarian  . CAD Mother   . Cancer Father 50       brain  . CAD  Father   . Cancer Daughter 18       skin  . Cancer Maternal Aunt 80       breast  . Leukemia Paternal Grandfather     Social History Social History   Tobacco Use  . Smoking status: Never Smoker  . Smokeless tobacco: Never Used  Substance Use Topics  . Alcohol use: No  . Drug use: No    Review of Systems  Constitutional: Negative for fever. Eyes: Negative for visual changes. ENT: Negative for sore throat. Cardiovascular: Negative for chest pain. Respiratory: Negative for shortness of breath. Gastrointestinal: Negative for abdominal pain, vomiting and diarrhea. Genitourinary: Negative for dysuria. Musculoskeletal: Negative for back pain.  Right knee and right ankle pain as above. Skin: Negative for rash. Neurological: Negative for headaches, focal weakness or numbness. ____________________________________________  PHYSICAL EXAM:  VITAL SIGNS: ED Triage Vitals  Enc Vitals Group     BP 05/13/18 1017 110/68     Pulse Rate 05/13/18 1017 (!) 118     Resp 05/13/18 1015 18     Temp  05/13/18 1015 97.9 F (36.6 C)     Temp Source 05/13/18 1015 Oral     SpO2 05/13/18 1015 98 %     Weight 05/13/18 1015 206 lb (93.4 kg)     Height 05/13/18 1015 5\' 5"  (1.651 m)     Head Circumference --      Peak Flow --      Pain Score 05/13/18 1015 10     Pain Loc --      Pain Edu? --      Excl. in [place]? --     Constitutional: Alert and oriented. Well appearing and in no distress. Head: Normocephalic and atraumatic. Eyes: Conjunctivae are normal. Normal extraocular movements Cardiovascular: Normal rate, regular rhythm. Normal distal pulses. Respiratory: Normal respiratory effort. No wheezes/rales/rhonchi. Musculoskeletal: Right knee without any obvious deformity, dislocation, or effusion.  Patient is tender to palpation of the medial and lateral joint lines.  She sitting in a wheelchair with the leg held in a partially extended position off of the foot rest.  She also localizes some tenderness to the lateral right ankle.  She is able to demonstrate normal ankle flexion extension range.  No significant calf or Achilles tenderness is elicited.  Nontender with normal range of motion in all other extremities.  Neurologic: Normal speech and language. No gross focal neurologic deficits are appreciated. Skin:  Skin is warm, dry and intact. No rash noted. Psychiatric: Mood and affect are normal. Patient exhibits appropriate insight and judgment. ___________________________________________   RADIOLOGY  Right Knee IMPRESSION: No acute findings. Mild medial compartment osteoarthritis. ____________________________________________  PROCEDURES  Procedures Toradol 30 mg IM Flexeril 10 mg PO Ace bandage ____________________________________________  INITIAL IMPRESSION / ASSESSMENT AND PLAN / ED COURSE  Patient with ED evaluation of acute right knee pain following mechanical fall this morning.  Patient ports a slip and fall in the shower landing on the right knee.  She had been seen 2 weeks  prior for similar mechanical fall and on the right knee.  X-ray again is negative for any acute findings but does show some moderate medial compartment arthritis.  Patient is placed in Ace bandage and will reapply her knee sleeve at home.  I reviewed the patient's prescription history over the last 12 months in the multi-state controlled substances database(s) that includes [place], [place], [place], [place], [place], [place], [place], [place], [place], [place], [place], [place], [place], and  [place].  Results were notable for monthly Tramadol prescriptions and recent Roxicet. ____________________________________________  FINAL CLINICAL IMPRESSION(S) / ED DIAGNOSES  Final diagnoses:  Primary osteoarthritis of right knee  Strain of left knee, initial encounter      Melvenia Needles, PA-C 05/13/18 1217    Delman Kitten, MD 05/13/18 1544

## 2018-05-13 NOTE — Discharge Instructions (Signed)
Your exam and x-rays reveals moderate-severe osteoarthritis. You did not suffer a fracture or dislocation due to your fall. Take the prescription steroid and muscle relaxant as directed. Rest with the leg elevated and apply ice to reduce pain and swelling. You must call tomorrow to schedule a follow-up appointment with orthopedics, as previously directed. Follow-up with your primary care provider for interim symptoms.

## 2018-05-13 NOTE — ED Triage Notes (Signed)
Pt to ED via POV with c/o RT knee pain after mech fall this am. PT states injured same knee recently. Pt appears in pain, VSS

## 2018-05-13 NOTE — ED Notes (Signed)
First Nurse Note: Patient to ED via [place] from POV complaining of right knee pain, states she had a previous knee injury and fell in the shower this morning.  Crying in pain.  Ice pack applied.

## 2018-05-13 NOTE — ED Notes (Signed)
See triage note  States she fell getting out of the shower this am  Having pain to right knee  States pain is anterior and lateral with some slight pain posterior  Provider in room on arrival to treatment room

## 2018-05-15 DIAGNOSIS — M25561 Pain in right knee: Secondary | ICD-10-CM | POA: Insufficient documentation

## 2018-05-22 ENCOUNTER — Other Ambulatory Visit: Payer: Self-pay | Admitting: Orthopedic Surgery

## 2018-05-22 DIAGNOSIS — M25561 Pain in right knee: Secondary | ICD-10-CM

## 2018-05-23 ENCOUNTER — Ambulatory Visit: Payer: Medicaid Other | Admitting: Podiatry

## 2018-05-24 ENCOUNTER — Other Ambulatory Visit: Payer: Self-pay

## 2018-05-24 ENCOUNTER — Emergency Department
Admission: EM | Admit: 2018-05-24 | Discharge: 2018-05-25 | Disposition: A | Discharge status: Home / Self Care | Payer: Medicaid Other | Attending: Emergency Medicine | Admitting: Emergency Medicine

## 2018-05-24 ENCOUNTER — Emergency Department: Payer: Medicaid Other

## 2018-05-24 ENCOUNTER — Encounter: Payer: Self-pay | Admitting: Emergency Medicine

## 2018-05-24 DIAGNOSIS — E876 Hypokalemia: Secondary | ICD-10-CM

## 2018-05-24 DIAGNOSIS — Z91040 Latex allergy status: Secondary | ICD-10-CM | POA: Diagnosis not present

## 2018-05-24 DIAGNOSIS — Z853 Personal history of malignant neoplasm of breast: Secondary | ICD-10-CM | POA: Diagnosis not present

## 2018-05-24 DIAGNOSIS — Z9049 Acquired absence of other specified parts of digestive tract: Secondary | ICD-10-CM | POA: Insufficient documentation

## 2018-05-24 DIAGNOSIS — R1030 Lower abdominal pain, unspecified: Secondary | ICD-10-CM | POA: Insufficient documentation

## 2018-05-24 DIAGNOSIS — Z79899 Other long term (current) drug therapy: Secondary | ICD-10-CM | POA: Insufficient documentation

## 2018-05-24 DIAGNOSIS — J45909 Unspecified asthma, uncomplicated: Secondary | ICD-10-CM | POA: Insufficient documentation

## 2018-05-24 DIAGNOSIS — I5022 Chronic systolic (congestive) heart failure: Secondary | ICD-10-CM | POA: Diagnosis not present

## 2018-05-24 DIAGNOSIS — R109 Unspecified abdominal pain: Secondary | ICD-10-CM

## 2018-05-24 LAB — URINALYSIS, COMPLETE (UACMP) WITH MICROSCOPIC
BILIRUBIN URINE: NEGATIVE
GLUCOSE, UA: NEGATIVE mg/dL
Hgb urine dipstick: NEGATIVE
KETONES UR: NEGATIVE mg/dL
LEUKOCYTES UA: NEGATIVE
Nitrite: NEGATIVE
PH: 6 (ref 5.0–8.0)
Protein, ur: NEGATIVE mg/dL
Specific Gravity, Urine: 1.005 (ref 1.005–1.030)

## 2018-05-24 LAB — COMPREHENSIVE METABOLIC PANEL
ALBUMIN: 3.9 g/dL (ref 3.5–5.0)
ALT: 18 U/L (ref 0–44)
AST: 21 U/L (ref 15–41)
Alkaline Phosphatase: 94 U/L (ref 38–126)
Anion gap: 11 (ref 5–15)
BUN: 11 mg/dL (ref 6–20)
CHLORIDE: 101 mmol/L (ref 98–111)
CO2: 28 mmol/L (ref 22–32)
CREATININE: 0.93 mg/dL (ref 0.44–1.00)
Calcium: 9.1 mg/dL (ref 8.9–10.3)
GFR calc Af Amer: >60 mL/min (ref >60)
GFR calc non Af Amer: >60 mL/min (ref >60)
GLUCOSE: 127 mg/dL — AB (ref 70–99)
POTASSIUM: 3 mmol/L — AB (ref 3.5–5.1)
SODIUM: 140 mmol/L (ref 135–145)
Total Bilirubin: 0.4 mg/dL (ref 0.3–1.2)
Total Protein: 7.4 g/dL (ref 6.5–8.1)

## 2018-05-24 LAB — LIPASE, BLOOD: LIPASE: 42 U/L (ref 11–51)

## 2018-05-24 LAB — CBC
HCT: 37 % (ref 35.0–47.0)
Hemoglobin: 13.3 g/dL (ref 12.0–16.0)
MCH: 29.6 pg (ref 26.0–34.0)
MCHC: 35.9 g/dL (ref 32.0–36.0)
MCV: 82.5 fL (ref 80.0–100.0)
PLATELETS: 270 10*3/uL (ref 150–440)
RBC: 4.49 MIL/uL (ref 3.80–5.20)
RDW: 18.1 % — AB (ref 11.5–14.5)
WBC: 10 10*3/uL (ref 3.6–11.0)

## 2018-05-24 MED ORDER — POTASSIUM CHLORIDE CRYS ER 20 MEQ PO TBCR
40.0000 meq | EXTENDED_RELEASE_TABLET | Freq: Once | ORAL | Status: AC
  Administered 2018-05-25: 40 meq via ORAL
  Filled 2018-05-24: qty 2

## 2018-05-24 MED ORDER — PROMETHAZINE HCL 25 MG/ML IJ SOLN
12.5000 mg | Freq: Once | INTRAMUSCULAR | Status: AC
  Administered 2018-05-25: 12.5 mg via INTRAVENOUS
  Filled 2018-05-24: qty 1

## 2018-05-24 MED ORDER — SODIUM CHLORIDE 0.9 % IV BOLUS
1000.0000 mL | Freq: Once | INTRAVENOUS | Status: AC
  Administered 2018-05-25: 1000 mL via INTRAVENOUS

## 2018-05-24 MED ORDER — HYDROMORPHONE HCL 1 MG/ML IJ SOLN
0.5000 mg | Freq: Once | INTRAMUSCULAR | Status: AC
  Administered 2018-05-25: 0.5 mg via INTRAVENOUS
  Filled 2018-05-24: qty 1

## 2018-05-24 MED ORDER — OXYCODONE-ACETAMINOPHEN 5-325 MG PO TABS
1.0000 | ORAL_TABLET | ORAL | Status: DC | PRN
  Administered 2018-05-24: 1 via ORAL
  Filled 2018-05-24: qty 1

## 2018-05-24 NOTE — ED Notes (Signed)
Resumed care from [place] rn.  Pt alert. Pt has bilateral flank pain.  Pt reports hx of kidney stones.  Pt also reports diff urinating.  Intermittent nausea.  Pt alert.

## 2018-05-24 NOTE — ED Provider Notes (Signed)
[facility] Emergency Department Provider Note   ____________________________________________   First MD Initiated Contact with Patient 05/24/18 2311     (approximate)  I have reviewed the triage vital signs and the nursing notes.   HISTORY  Chief Complaint Flank Pain    HPI Brittany Nguyen is a 52 y.o. female who presents to the ED from home with a chief complaint of flank pain.  Patient reports bilateral flank pain which began yesterday, left greater than right.  Yesterday she felt like she was having right lower rib pain because she had been coughing.  Today left flank pain is more severe.  Symptoms associated with nausea and urinary hesitancy.  Reports a history of 2 gastric masses and states she has chronic abdominal pain as well as chronic diarrhea status post colon surgery.  Denies associated fever, chills, chest pain, shortness of breath, vomiting.  Denies recent travel or trauma.   Past Medical History:  Diagnosis Date  . Asthma 2013  . Breast cancer ([place]) 2014   right side  . CHF (congestive heart failure) ([place])   . Diverticulitis 2010  . DVT (deep venous thrombosis) ([place])   . Endometriosis 1990  . Heart disease 2013  . Hx MRSA infection   . Lump or mass in breast   . Restless leg     Patient Active Problem List   Diagnosis Date Noted  . Iron deficiency anemia 03/27/2018  . Anemia 03/26/2018  . Abdominal pain 02/20/2018  . Unstable angina ([place])   . Encounter for anticoagulation discussion and counseling   . C. difficile colitis 01/14/2018  . GIB (gastrointestinal bleeding) 01/08/2018  . SOB (shortness of breath) 11/01/2017  . Influenza A 10/29/2017  . Acute respiratory failure ([place]) 09/24/2017  . Sepsis ([place]) 07/03/2017  . COPD (chronic obstructive pulmonary disease) ([place]) 04/26/2017  . Community acquired pneumonia 04/25/2017  . Chronic systolic heart failure ([place]) 02/03/2017  . Hypotension 02/03/2017  . Chest pain  12/09/2016  . RLS (restless legs syndrome) 12/09/2016  . Cough, persistent 11/22/2015  . Hypokalemia 11/22/2015  . Leukocytosis 11/22/2015  . Dyspnea 11/21/2015  . Respiratory distress 09/19/2015  . Breast pain 01/11/2013  . Hx MRSA infection   . Lump or mass in breast   . GOITER, MULTINODULAR 01/20/2009  . HYPOGLYCEMIA, UNSPECIFIED 01/20/2009  . GERD 01/20/2009  . DIVERTICULITIS OF COLON 01/20/2009    Past Surgical History:  Procedure Laterality Date  . ABDOMINAL HYSTERECTOMY     age 64  . CARDIAC DEFIBRILLATOR PLACEMENT    . CESAREAN SECTION    . CHOLECYSTECTOMY    . COLECTOMY    . COLONOSCOPY WITH PROPOFOL N/A 02/22/2018   Procedure: COLONOSCOPY WITH PROPOFOL;  Surgeon: Toledo, Benay Pike, MD;  Location: [facility] ENDOSCOPY;  Service: Gastroenterology;  Laterality: N/A;  . ESOPHAGOGASTRODUODENOSCOPY (EGD) WITH PROPOFOL N/A 02/22/2018   Procedure: ESOPHAGOGASTRODUODENOSCOPY (EGD) WITH PROPOFOL;  Surgeon: Toledo, Benay Pike, MD;  Location: [facility] ENDOSCOPY;  Service: Gastroenterology;  Laterality: N/A;  . NASAL SINUS SURGERY  2012    Prior to Admission medications   Medication Sig Start Date End Date Taking? Authorizing Provider  albuterol (ACCUNEB) 1.25 MG/3ML nebulizer solution Take 3 mLs by nebulization every 6 (six) hours as needed for wheezing. 11/09/17   [provider]  beclomethasone (QVAR) 80 MCG/ACT inhaler Inhale 2 puffs into the lungs 2 (two) times daily. 06/05/17 06/05/18  Laverle Hobby, MD  butalbital-acetaminophen-caffeine (FIORICET, ESGIC) 50-325-40 MG tablet Take 1-2 tablets by mouth every 6 (six)  hours as needed for headache. 05/04/18 05/04/19  Harvest Dark, MD  cyanocobalamin 500 MCG tablet Take 500 mcg by mouth daily.    [provider]  dicyclomine (BENTYL) 10 MG capsule Take 10 mg by mouth 3 (three) times daily as needed (abdominal pain).  12/29/17   [provider]  ferrous sulfate 325 (65 FE) MG tablet Take 325 mg by mouth daily  with breakfast.    [provider]  HYDROcodone-acetaminophen (NORCO) 5-325 MG tablet Take 1 tablet by mouth every 6 (six) hours as needed for moderate pain. 05/25/18   Paulette Blanch, MD  hyoscyamine (LEVSIN SL) 0.125 MG SL tablet Place 1 tablet under the tongue every 6 (six) hours as needed for diarrhea or loose stools. 02/13/18   [provider]  ibuprofen (ADVIL,MOTRIN) 800 MG tablet Take 1 tablet (800 mg total) by mouth every 8 (eight) hours as needed for moderate pain. 05/25/18   Paulette Blanch, MD  levothyroxine (SYNTHROID, LEVOTHROID) 150 MCG tablet Take 150 mcg by mouth daily before breakfast.    [provider]  losartan (COZAAR) 25 MG tablet Take 12.5 mg by mouth 2 (two) times daily.    [provider]  metoprolol succinate (TOPROL-XL) 100 MG 24 hr tablet Take 1 tablet (100 mg total) by mouth daily. Take with or immediately following a meal. 01/16/18   Loletha Grayer, MD  Multiple Vitamin (MULTIVITAMIN WITH MINERALS) TABS tablet Take 1 tablet by mouth daily.    [provider]  nitroGLYCERIN (NITROSTAT) 0.4 MG SL tablet Place 1 tablet (0.4 mg total) under the tongue every 5 (five) minutes as needed for chest pain. 01/24/17   Henreitta Leber, MD  pantoprazole (PROTONIX) 40 MG tablet Take 1 tablet (40 mg total) by mouth daily. 02/01/18   Saundra Shelling, MD  promethazine (PHENERGAN) 25 MG tablet Take 1 tablet (25 mg total) by mouth every 6 (six) hours as needed for nausea or vomiting. 05/25/18   Paulette Blanch, MD  rOPINIRole (REQUIP) 2 MG tablet Take 2 tablets (4 mg total) by mouth at bedtime. 2-4  Mg per night Patient taking differently: Take 4 mg by mouth at bedtime.  01/16/18   Loletha Grayer, MD  sertraline (ZOLOFT) 50 MG tablet Take 50 mg by mouth daily.    [provider]  spironolactone (ALDACTONE) 50 MG tablet Take 50 mg by mouth daily.    [provider]  traMADol (ULTRAM) 50 MG tablet Take 50 mg by mouth every 6 (six) hours as  needed.    [provider]    Allergies Contrast media [iodinated diagnostic agents]; Metrizamide; Penicillins; Isosorbide nitrate; Latex; Ondansetron; Zofran [ondansetron hcl]; Povidone-iodine; and Pulmicort [budesonide]  Family History  Problem Relation Age of Onset  . Cancer Mother 30       ovarian  . CAD Mother   . Cancer Father 71       brain  . CAD Father   . Cancer Daughter 18       skin  . Cancer Maternal Aunt 68       breast  . Leukemia Paternal Grandfather     Social History Social History   Tobacco Use  . Smoking status: Never Smoker  . Smokeless tobacco: Never Used  Substance Use Topics  . Alcohol use: No  . Drug use: No    Review of Systems  Constitutional: No fever/chills Eyes: No visual changes. ENT: No sore throat. Cardiovascular: Denies chest pain. Respiratory: Positive for cough.  Denies shortness of breath. Gastrointestinal: Positive for flank and abdominal pain.  Positive for nausea, no vomiting.  No diarrhea.  No constipation. Genitourinary: Negative for dysuria. Musculoskeletal: Negative for back pain. Skin: Negative for rash. Neurological: Negative for headaches, focal weakness or numbness.   ____________________________________________   PHYSICAL EXAM:  VITAL SIGNS: ED Triage Vitals  Enc Vitals Group     BP 05/24/18 2123 130/86     Pulse Rate 05/24/18 2123 (!) 104     Resp 05/24/18 2123 20     Temp 05/24/18 2123 98.4 F (36.9 C)     Temp Source 05/24/18 2123 Oral     SpO2 05/24/18 2123 97 %     Weight 05/24/18 2124 205 lb 14.6 oz (93.4 kg)     Height 05/24/18 2124 5\' 5"  (1.651 m)     Head Circumference --      Peak Flow --      Pain Score 05/24/18 2123 8     Pain Loc --      Pain Edu? --      Excl. in [place]? --     Constitutional: Alert and oriented.  Uncomfortable appearing and in mild acute distress. Eyes: Conjunctivae are normal. PERRL. EOMI. Head: Atraumatic. Nose: No congestion/rhinnorhea. Mouth/Throat:  Mucous membranes are moist.  Oropharynx non-erythematous. Neck: No stridor.   Cardiovascular: Normal rate, regular rhythm. Grossly normal heart sounds.  Good peripheral circulation. Respiratory: Normal respiratory effort.  No retractions. Lungs CTAB.  Right anterior lower ribs beneath breast tender to palpation. Gastrointestinal: Soft and mildly tender to palpation left upper, mid and lower quadrants without rebound or guarding. No distention. No abdominal bruits.  Left CVA tenderness. Musculoskeletal: No lower extremity tenderness nor edema.  No joint effusions. Neurologic:  Normal speech and language. No gross focal neurologic deficits are appreciated. No gait instability. Skin:  Skin is warm, dry and intact. No rash noted.  No petechiae. Psychiatric: Mood and affect are normal. Speech and behavior are normal.  ____________________________________________   LABS (all labs ordered are listed, but only abnormal results are displayed)  Labs Reviewed  COMPREHENSIVE METABOLIC PANEL - Abnormal; Notable for the following components:      Result Value   Potassium 3.0 (*)    Glucose, Bld 127 (*)    All other components within normal limits  CBC - Abnormal; Notable for the following components:   RDW 18.1 (*)    All other components within normal limits  URINALYSIS, COMPLETE (UACMP) WITH MICROSCOPIC - Abnormal; Notable for the following components:   Color, Urine YELLOW (*)    APPearance HAZY (*)    Bacteria, UA RARE (*)    All other components within normal limits  LIPASE, BLOOD   ____________________________________________  EKG  None ____________________________________________  RADIOLOGY  ED MD interpretation: No acute cardiopulmonary process; CT scan reveals no acute abnormalities  Official radiology report(s): Dg Chest 2 View  Result Date: 05/25/2018 CLINICAL DATA:  Cough and right rib pain. EXAM: CHEST - 2 VIEW COMPARISON:  05/04/2018 FINDINGS: Cardiac pacemaker.  Borderline heart size. No pulmonary vascular congestion, edema, or consolidation. No blunting of costophrenic angles. No pneumothorax. Mediastinal contours appear intact. Surgical clips in the right upper quadrant. IMPRESSION: No active cardiopulmonary disease. Electronically Signed   By: Lucienne Capers M.D.   On: 05/25/2018 00:29   Ct Renal Stone Study  Result Date: 05/25/2018 CLINICAL DATA:  Right-sided flank pain beginning yesterday. EXAM: CT ABDOMEN AND PELVIS WITHOUT CONTRAST TECHNIQUE: Multidetector CT imaging of the abdomen  and pelvis was performed following the standard protocol without IV contrast. COMPARISON:  01/14/2018 FINDINGS: Lower chest: The lung bases are clear. Hepatobiliary: No focal liver abnormality is seen. Status post cholecystectomy. No biliary dilatation. Pancreas: Unremarkable. No pancreatic ductal dilatation or surrounding inflammatory changes. Spleen: Normal in size without focal abnormality. Adrenals/Urinary Tract: Adrenal glands are unremarkable. Kidneys are normal, without renal calculi, focal lesion, or hydronephrosis. Bladder is unremarkable. Stomach/Bowel: Stomach, small bowel, and colon are not abnormally distended. Small diverticulum in the second portion of the duodenum. No wall thickening or inflammatory changes appreciated. Surgical anastomosis in the sigmoid region with surgical clips in the mesentery. Appendix is not identified. Vascular/Lymphatic: No significant vascular findings are present. No enlarged abdominal or pelvic lymph nodes. Reproductive: Status post hysterectomy. No adnexal masses. Other: No abdominal wall hernia or abnormality. No abdominopelvic ascites. Musculoskeletal: No acute or significant osseous findings. IMPRESSION: 1. No acute process demonstrated in the abdomen or pelvis. 2. No evidence of renal or ureteral stone or obstruction. 3. No evidence of bowel obstruction or inflammation. Small diverticulum in the second portion of the duodenum. 4.  Status post cholecystectomy and hysterectomy. Electronically Signed   By: Lucienne Capers M.D.   On: 05/25/2018 00:28    ____________________________________________   PROCEDURES  Procedure(s) performed: None  Procedures  Critical Care performed: No  ____________________________________________   INITIAL IMPRESSION / ASSESSMENT AND PLAN / ED COURSE  As part of my medical decision making, I reviewed the following data within the [place] notes reviewed and incorporated, Labs reviewed, Old chart reviewed, Radiograph reviewed  and Notes from prior ED visits   51 year old female who presents with bilateral flank pain. Differential diagnosis includes, but is not limited to, ovarian cyst, ovarian torsion, acute appendicitis, diverticulitis, urinary tract infection/pyelonephritis, endometriosis, bowel obstruction, colitis, renal colic, gastroenteritis, hernia, fibroids, endometriosis, etc.  Laboratory urinalysis results remarkable for mild hypokalemia.  Hazy appearance to urine.  Will initiate IV fluid resuscitation, administer 0.5 mg IV Dilaudid paired with 12.5 mg IV Phenergan for pain and nausea.  Proceed with CT renal colic study. Clinical Course as of May 26 239  Fri May 25, 2018  0105 Updated patient on CT imaging result.  Pain has improved with patient's nausea return.  Will administer additional dose of Phenergan and observe.   [JS]  0237 Nausea significantly improved.  Tolerated ice chips without emesis.  Will discharge home with limited prescriptions for analgesia, antiemetic and patient will follow closely with her PCP.  Strict return precautions given.  Patient verbalizes understanding and agrees with plan of care.   [JS]    Clinical Course User Index [JS] Paulette Blanch, MD     ____________________________________________   FINAL CLINICAL IMPRESSION(S) / ED DIAGNOSES  Final diagnoses:  Flank pain  Hypokalemia     ED Discharge Orders           Ordered    ibuprofen (ADVIL,MOTRIN) 800 MG tablet  Every 8 hours PRN     05/25/18 0239    HYDROcodone-acetaminophen (NORCO) 5-325 MG tablet  Every 6 hours PRN     05/25/18 0239    promethazine (PHENERGAN) 25 MG tablet  Every 6 hours PRN     05/25/18 0239           Note:  This document was prepared using Dragon voice recognition software and may include unintentional dictation errors.    Paulette Blanch, MD 05/25/18 [phone]

## 2018-05-24 NOTE — ED Triage Notes (Signed)
Patient to ER for c/o right sided flank pain that began yesterday. States yesterday, it felt like lower rib pain. States she had had a little bit of cold and thought it may be from that. Also reports current history of two abdominal masses. Patient states current pain still feels like lower ribs as well, but now into right flank. Patient was recently placed on Losartan, but denies any other changes. Patient appears to be uncomfortable. Patient currently receiving Iron, Potassium, and blood products from cancer center.

## 2018-05-25 ENCOUNTER — Emergency Department: Payer: Medicaid Other

## 2018-05-25 MED ORDER — PROMETHAZINE HCL 25 MG PO TABS: 25.0000 mg | ORAL_TABLET | Freq: Four times a day (QID) | ORAL | 0 refills | Status: DC | PRN

## 2018-05-25 MED ORDER — PROMETHAZINE HCL 25 MG/ML IJ SOLN
INTRAMUSCULAR | Status: AC
  Filled 2018-05-25: qty 1

## 2018-05-25 MED ORDER — PROMETHAZINE HCL 25 MG/ML IJ SOLN
12.5000 mg | Freq: Once | INTRAMUSCULAR | Status: AC
  Administered 2018-05-25: 12.5 mg via INTRAVENOUS

## 2018-05-25 MED ORDER — IBUPROFEN 800 MG PO TABS: 800.0000 mg | ORAL_TABLET | Freq: Three times a day (TID) | ORAL | 0 refills | Status: DC | PRN

## 2018-05-25 MED ORDER — HYDROCODONE-ACETAMINOPHEN 5-325 MG PO TABS: 1.0000 | ORAL_TABLET | Freq: Four times a day (QID) | ORAL | 0 refills | Status: DC | PRN

## 2018-05-25 NOTE — ED Notes (Signed)
Bladder scan  173 mls urine  md aware

## 2018-05-25 NOTE — Discharge Instructions (Addendum)
1.  You may take pain and nausea medicines as needed (Motrin/Norco/Phenergan #15). 2.  Return to the ER for worsening symptoms, persistent vomiting, difficulty breathing or other concerns.

## 2018-05-28 ENCOUNTER — Ambulatory Visit
Admission: RE | Admit: 2018-05-28 | Discharge: 2018-05-28 | Disposition: A | Discharge status: Home / Self Care | Payer: Medicaid Other | Source: Ambulatory Visit | Attending: Orthopedic Surgery | Admitting: Orthopedic Surgery

## 2018-05-28 ENCOUNTER — Encounter: Payer: Self-pay | Admitting: Oncology

## 2018-05-28 ENCOUNTER — Other Ambulatory Visit: Payer: Medicaid Other | Admitting: Oncology

## 2018-05-28 ENCOUNTER — Inpatient Hospital Stay: Payer: Medicaid Other

## 2018-05-28 ENCOUNTER — Inpatient Hospital Stay: Payer: Medicaid Other | Attending: Oncology | Admitting: Oncology

## 2018-05-28 VITALS — BP 106/75 | HR 92 | Temp 97.2°F | Resp 18 | Ht 65.0 in | Wt 221.1 lb

## 2018-05-28 DIAGNOSIS — M25561 Pain in right knee: Secondary | ICD-10-CM | POA: Insufficient documentation

## 2018-05-28 DIAGNOSIS — E164 Increased secretion of gastrin: Secondary | ICD-10-CM

## 2018-05-28 DIAGNOSIS — M2391 Unspecified internal derangement of right knee: Secondary | ICD-10-CM | POA: Diagnosis not present

## 2018-05-28 DIAGNOSIS — D509 Iron deficiency anemia, unspecified: Secondary | ICD-10-CM | POA: Diagnosis present

## 2018-05-28 DIAGNOSIS — K2940 Chronic atrophic gastritis without bleeding: Secondary | ICD-10-CM

## 2018-05-28 LAB — CBC
HCT: 40.2 % (ref 35.0–47.0)
Hemoglobin: 13.8 g/dL (ref 12.0–16.0)
MCH: 28.7 pg (ref 26.0–34.0)
MCHC: 34.4 g/dL (ref 32.0–36.0)
MCV: 83.3 fL (ref 80.0–100.0)
Platelets: 267 10*3/uL (ref 150–440)
RBC: 4.83 MIL/uL (ref 3.80–5.20)
RDW: 18.9 % — ABNORMAL HIGH (ref 11.5–14.5)
WBC: 10.4 10*3/uL (ref 3.6–11.0)

## 2018-05-28 LAB — FERRITIN: FERRITIN: 159 ng/mL (ref 11–307)

## 2018-05-28 LAB — IRON AND TIBC
Iron: 85 ug/dL (ref 28–170)
Saturation Ratios: 28 % (ref 10.4–31.8)
TIBC: 308 ug/dL (ref 250–450)
UIBC: 223 ug/dL

## 2018-05-28 NOTE — Progress Notes (Addendum)
Hematology/Oncology Consult note [facility]  Telephone:([phone] [phone]  Patient Care Team: [name], MD as PCP - General (Family Medicine) Corey Skains, MD as Consulting Physician (Cardiology)   Name of the patient: Brittany Nguyen  [id number]  05-30-1966   Date of visit: 05/28/18  Diagnosis- 1. Iron deficiency anemia 2. 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  Patient states that she has had chronic diarrhea since her gallbladder was taken out few years ago.Patient underwent EGD on 02/22/2018 which showed normal esophagus.  Mildly erythematous mucosa without bleeding in the gastric antrum.  Small submucosal noncircumferential mass with no bleeding and no recent stigmata of bleeding in the gastric body.  Examined portion of duodenum was normal.  Colonoscopy showed diverticulosis in the sigmoid colon.  End-to-end colocolonic anastomosis with healthy-appearing mucosa.  Internal hemorrhoids.  Patient was also referred to [facility] and underwent EUS of the gastric submucosal mass.  EGD pathology showed reactive duodenitis negative for dysplasia and malignancy in the duodenum.  Evidence of reactive gastritis in the antral mucosa with moderate chronic gastritis and intestinal metaplasia.  Negative for H. pylori dysplasia or malignancy.  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  Colonic mucosa was unremarkable without any evidence of dysplasia and malignancy.  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  Negative for lipoma negative for granulomas.  Patient also had a gastrin level checked on 02/28/2018 which was elevated at 1698 and hence patient has been referred to [place] for the same.  24-hour 5-HIAA level was normal.  Results of anemia work up revealed iron deficiency. She could not tolerate feraheme and was witched to venofer   Interval history- Patient continues to have on and off abdominal pain. She has been to ER few times with these symptoms and also had CT abdomen which did not reveal any acute pathology. Patient reports 35 pound weight loss over last 1 year. Weight on 06/05/17 in our system was 226 lb and is 221 lb today.  ECOG PS- 1 Pain scale- 7   Review of systems- Review of Systems  Constitutional: Positive for malaise/fatigue and weight loss. Negative for chills and fever.  HENT: Negative for congestion, ear discharge and nosebleeds.   Eyes: Negative for blurred vision.  Respiratory: Negative for cough, hemoptysis, sputum production, shortness of breath and wheezing.   Cardiovascular: Negative for chest pain, palpitations, orthopnea and claudication.  Gastrointestinal: Positive for abdominal pain and nausea. Negative for blood in stool, constipation, diarrhea, heartburn, melena and vomiting.  Genitourinary: Negative for dysuria, flank pain, frequency, hematuria and urgency.  Musculoskeletal: Negative for back pain, joint pain and myalgias.  Skin: Negative for rash.  Neurological: Negative for dizziness, tingling, focal weakness, seizures, weakness and headaches.  Endo/Heme/Allergies: Does not bruise/bleed easily.  Psychiatric/Behavioral: Negative for depression and suicidal ideas. The patient does not have insomnia.      Allergies  Allergen Reactions  . Contrast Media [Iodinated Diagnostic Agents] Shortness Of Breath    Per CT report in 2014 pt had break through contrast reaction with hives and SOB following 13 hour prep. MSY   . Metrizamide Shortness Of  Breath  . Penicillins Hives and Other (See Comments)    Other reaction(s): Other (See Comments) Has patient had a PCN reaction causing immediate rash, facial/tongue/throat swelling, SOB or lightheadedness with hypotension: Yes Has patient had a PCN reaction causing severe rash involving mucus membranes or skin necrosis: No Has patient had a PCN reaction that required hospitalization No Has patient had a PCN reaction occurring within the last 10 years: No If all of the above answers are "NO", then may proceed with Cephalosporin use. Has patient had a PCN reaction causing immediate rash, facial/tongue/throat swelling, SOB or lightheadedness with hypotension: Yes Has patient had a PCN reaction causing severe rash involving mucus membranes or skin necrosis: No Has patient had a PCN reaction that required hospitalization No Has patient had a PCN reaction occurring within the last 10 years: No If all of the above answers are "NO", then may proceed with Cephalosporin use.   . Isosorbide Nitrate     Other reaction(s): Headache  . Latex Hives  . Ondansetron Other (See Comments)    Severe headache  . Zofran [Ondansetron Hcl] Other (See Comments)    Causes migraines  . Povidone-Iodine Rash    Blistering rash  . Pulmicort [Budesonide] Itching     Past Medical History:  Diagnosis Date  . Asthma 2013  . Breast cancer ([place]) 2014   right side  . CHF (congestive heart failure) ([place])   . Diverticulitis 2010  . DVT (deep venous thrombosis) ([place])   . Endometriosis 1990  . Heart disease 2013  . Hx MRSA infection   . Lump or mass in breast   . Restless leg      Past Surgical History:  Procedure Laterality Date  . ABDOMINAL HYSTERECTOMY     age 7  . CARDIAC DEFIBRILLATOR PLACEMENT    . CESAREAN SECTION    . CHOLECYSTECTOMY    . COLECTOMY    . COLONOSCOPY WITH PROPOFOL N/A 02/22/2018   Procedure: COLONOSCOPY WITH PROPOFOL;  Surgeon: Toledo, Benay Pike, MD;  Location: [facility] ENDOSCOPY;   Service: Gastroenterology;  Laterality: N/A;  . ESOPHAGOGASTRODUODENOSCOPY (EGD) WITH PROPOFOL N/A 02/22/2018   Procedure: ESOPHAGOGASTRODUODENOSCOPY (EGD) WITH PROPOFOL;  Surgeon: Toledo, Benay Pike, MD;  Location: [facility] ENDOSCOPY;  Service: Gastroenterology;  Laterality: N/A;  . NASAL SINUS SURGERY  2012    Social History   Socioeconomic History  . Marital status: Married    Spouse name: Not on file  . Number of children: Not on file  . Years of education: Not on file  . Highest education level: Not on file  Occupational History  . Occupation: works at [employer].  Social Needs  . Financial resource strain: Not on file  . Food insecurity:    Worry: Not on file    Inability: Not on file  . Transportation needs:    Medical: Not on file    Non-medical: Not on file  Tobacco Use  . Smoking status: Never Smoker  . Smokeless tobacco: Never Used  Substance and Sexual Activity  . Alcohol use: No  . Drug use: No  . Sexual activity: Yes    Birth control/protection: Post-menopausal  Lifestyle  . Physical activity:    Days per week: Not on file    Minutes per session: Not on file  . Stress: Not on file  Relationships  . Social connections:    Talks on phone: Not on file    Gets together: Not on file    Attends religious service: Not on  file    Active member of club or organization: Not on file    Attends meetings of clubs or organizations: Not on file    Relationship status: Not on file  . Intimate partner violence:    Fear of current or ex partner: Not on file    Emotionally abused: Not on file    Physically abused: Not on file    Forced sexual activity: Not on file  Other Topics Concern  . Not on file  Social History Narrative  . Not on file    Family History  Problem Relation Age of Onset  . Cancer Mother 30       ovarian  . CAD Mother   . Cancer Father 24       brain  . CAD Father   . Cancer Daughter 18       skin  . Cancer Maternal Aunt 34       breast  .  Leukemia Paternal Grandfather      Current Outpatient Medications:  .  albuterol (ACCUNEB) 1.25 MG/3ML nebulizer solution, Take 3 mLs by nebulization every 6 (six) hours as needed for wheezing., Disp: , Rfl: 12 .  beclomethasone (QVAR) 80 MCG/ACT inhaler, Inhale 2 puffs into the lungs 2 (two) times daily., Disp: 1 Inhaler, Rfl: 5 .  butalbital-acetaminophen-caffeine (FIORICET, ESGIC) 50-325-40 MG tablet, Take 1-2 tablets by mouth every 6 (six) hours as needed for headache., Disp: 20 tablet, Rfl: 0 .  cyanocobalamin 500 MCG tablet, Take 500 mcg by mouth daily., Disp: , Rfl:  .  ibuprofen (ADVIL,MOTRIN) 800 MG tablet, Take 1 tablet (800 mg total) by mouth every 8 (eight) hours as needed for moderate pain., Disp: 15 tablet, Rfl: 0 .  levothyroxine (SYNTHROID, LEVOTHROID) 150 MCG tablet, Take 150 mcg by mouth daily before breakfast., Disp: , Rfl:  .  losartan (COZAAR) 25 MG tablet, Take 12.5 mg by mouth 2 (two) times daily., Disp: , Rfl:  .  metoprolol succinate (TOPROL-XL) 100 MG 24 hr tablet, Take 1 tablet (100 mg total) by mouth daily. Take with or immediately following a meal., Disp: 30 tablet, Rfl: 0 .  Multiple Vitamin (MULTIVITAMIN WITH MINERALS) TABS tablet, Take 1 tablet by mouth daily., Disp: , Rfl:  .  nitroGLYCERIN (NITROSTAT) 0.4 MG SL tablet, Place 1 tablet (0.4 mg total) under the tongue every 5 (five) minutes as needed for chest pain., Disp: 90 tablet, Rfl: 1 .  promethazine (PHENERGAN) 25 MG tablet, Take 1 tablet (25 mg total) by mouth every 6 (six) hours as needed for nausea or vomiting., Disp: 15 tablet, Rfl: 0 .  rOPINIRole (REQUIP) 2 MG tablet, Take 2 tablets (4 mg total) by mouth at bedtime. 2-4  Mg per night (Patient taking differently: Take 4 mg by mouth at bedtime. ), Disp: , Rfl:  .  sertraline (ZOLOFT) 50 MG tablet, Take 50 mg by mouth daily., Disp: , Rfl:  .  spironolactone (ALDACTONE) 50 MG tablet, Take 50 mg by mouth daily., Disp: , Rfl:  .  traMADol (ULTRAM) 50 MG  tablet, Take 50 mg by mouth every 6 (six) hours as needed., Disp: , Rfl:  .  ferrous sulfate 325 (65 FE) MG tablet, Take 325 mg by mouth daily with breakfast., Disp: , Rfl:   Physical exam:  Vitals:   05/28/18 1150  BP: 106/75  Pulse: 92  Resp: 18  Temp: (!) 97.2 F (36.2 C)  TempSrc: Tympanic  Weight: 221 lb 1.9 oz (100.3 kg)  Height:  5\' 5"  (1.651 m)   Physical Exam  Constitutional: She is oriented to person, place, and time.  She is obese. Does not appear to be in any acute distress  HENT:  Head: Normocephalic and atraumatic.  Eyes: Pupils are equal, round, and reactive to light. EOM are normal.  Neck: Normal range of motion.  Cardiovascular: Normal rate, regular rhythm and normal heart sounds.  Pulmonary/Chest: Effort normal and breath sounds normal.  Abdominal: Soft. Bowel sounds are normal.  Mild diffuse TTP  Neurological: She is alert and oriented to person, place, and time.  Skin: Skin is warm and dry.     CMP Latest Ref Rng & Units 05/24/2018  Glucose 70 - 99 mg/dL 127(H)  BUN 6 - 20 mg/dL 11  Creatinine 0.44 - 1.00 mg/dL 0.93  Sodium 135 - 145 mmol/L 140  Potassium 3.5 - 5.1 mmol/L 3.0(L)  Chloride 98 - 111 mmol/L 101  CO2 22 - 32 mmol/L 28  Calcium 8.9 - 10.3 mg/dL 9.1  Total Protein 6.5 - 8.1 g/dL 7.4  Total Bilirubin 0.3 - 1.2 mg/dL 0.4  Alkaline Phos 38 - 126 U/L 94  AST 15 - 41 U/L 21  ALT 0 - 44 U/L 18   CBC Latest Ref Rng & Units 05/28/2018  WBC 3.6 - 11.0 K/uL 10.4  Hemoglobin 12.0 - 16.0 g/dL 13.8  Hematocrit 35.0 - 47.0 % 40.2  Platelets 150 - 440 K/uL 267    No images are attached to the encounter.  Dg Chest 2 View  Result Date: 05/25/2018 CLINICAL DATA:  Cough and right rib pain. EXAM: CHEST - 2 VIEW COMPARISON:  05/04/2018 FINDINGS: Cardiac pacemaker. Borderline heart size. No pulmonary vascular congestion, edema, or consolidation. No blunting of costophrenic angles. No pneumothorax. Mediastinal contours appear intact. Surgical clips in the  right upper quadrant. IMPRESSION: No active cardiopulmonary disease. Electronically Signed   By: Lucienne Capers M.D.   On: 05/25/2018 00:29   Dg Chest 2 View  Result Date: 05/04/2018 CLINICAL DATA:  Chest pain. EXAM: CHEST - 2 VIEW COMPARISON:  Radiographs of April 04, 2018. FINDINGS: The heart size and mediastinal contours are within normal limits. Both lungs are clear. Left-sided pacemaker is unchanged in position. No pneumothorax or pleural effusion is noted. The visualized skeletal structures are unremarkable. IMPRESSION: No active cardiopulmonary disease. Electronically Signed   By: Marijo Conception, M.D.   On: 05/04/2018 11:53   Ct Head Wo Contrast  Result Date: 05/04/2018 CLINICAL DATA:  Acute onset headache EXAM: CT HEAD WITHOUT CONTRAST TECHNIQUE: Contiguous axial images were obtained from the base of the skull through the vertex without intravenous contrast. COMPARISON:  Head CT 12/08/2014 FINDINGS: Brain: There is no mass, hemorrhage or extra-axial collection. The size and configuration of the ventricles and extra-axial CSF spaces are normal. There is no acute or chronic infarction. The brain parenchyma is normal. Vascular: No abnormal hyperdensity of the major intracranial arteries or dural venous sinuses. No intracranial atherosclerosis. Skull: The visualized skull base, calvarium and extracranial soft tissues are normal. Sinuses/Orbits: No fluid levels or advanced mucosal thickening of the visualized paranasal sinuses. No mastoid or middle ear effusion. The orbits are normal. Other: None IMPRESSION: Normal head CT. Electronically Signed   By: Ulyses Jarred M.D.   On: 05/04/2018 13:57   Dg Knee Complete 4 Views Right  Result Date: 05/13/2018 CLINICAL DATA:  Fall this morning. Medial knee pain. Initial encounter. EXAM: RIGHT KNEE - COMPLETE 4+ VIEW COMPARISON:  04/29/2018 FINDINGS: No evidence  of fracture, dislocation, or joint effusion. Mild medial compartment osteoarthritis is seen, without  significant change. Soft tissues are unremarkable. IMPRESSION: No acute findings. Mild medial compartment osteoarthritis. Electronically Signed   By: Earle Gell M.D.   On: 05/13/2018 11:10   Dg Knee Complete 4 Views Right  Result Date: 04/29/2018 CLINICAL DATA:  Chronic knee pain. Acutely worsened after falling today. EXAM: RIGHT KNEE - COMPLETE 4+ VIEW COMPARISON:  None. FINDINGS: No joint effusion. No fracture or dislocation. Medial compartment osteoarthritis with joint space narrowing and marginal osteophytes. IMPRESSION: Medial compartment osteoarthritis.  No acute finding. Electronically Signed   By: Nelson Chimes M.D.   On: 04/29/2018 15:13   Ct Renal Stone Study  Result Date: 05/25/2018 CLINICAL DATA:  Right-sided flank pain beginning yesterday. EXAM: CT ABDOMEN AND PELVIS WITHOUT CONTRAST TECHNIQUE: Multidetector CT imaging of the abdomen and pelvis was performed following the standard protocol without IV contrast. COMPARISON:  01/14/2018 FINDINGS: Lower chest: The lung bases are clear. Hepatobiliary: No focal liver abnormality is seen. Status post cholecystectomy. No biliary dilatation. Pancreas: Unremarkable. No pancreatic ductal dilatation or surrounding inflammatory changes. Spleen: Normal in size without focal abnormality. Adrenals/Urinary Tract: Adrenal glands are unremarkable. Kidneys are normal, without renal calculi, focal lesion, or hydronephrosis. Bladder is unremarkable. Stomach/Bowel: Stomach, small bowel, and colon are not abnormally distended. Small diverticulum in the second portion of the duodenum. No wall thickening or inflammatory changes appreciated. Surgical anastomosis in the sigmoid region with surgical clips in the mesentery. Appendix is not identified. Vascular/Lymphatic: No significant vascular findings are present. No enlarged abdominal or pelvic lymph nodes. Reproductive: Status post hysterectomy. No adnexal masses. Other: No abdominal wall hernia or abnormality. No  abdominopelvic ascites. Musculoskeletal: No acute or significant osseous findings. IMPRESSION: 1. No acute process demonstrated in the abdomen or pelvis. 2. No evidence of renal or ureteral stone or obstruction. 3. No evidence of bowel obstruction or inflammation. Small diverticulum in the second portion of the duodenum. 4. Status post cholecystectomy and hysterectomy. Electronically Signed   By: Lucienne Capers M.D.   On: 05/25/2018 00:28     Assessment and plan- Patient is a 52 y.o. female with following issues:  1. Iron deficiency anemia: hb improved to 13.8. Iron studies are pending from today. Repeat cbc ferritin and iron studies in 3 and 6 month and I will see her back in 6 months  2. Elevated gastrin level- please refer to my note from 03/26/18 as well. So far there is no evidence of gastrinoma noted on her pathology and endoscopy findings. 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 Patient is off PPI and she needs gastric ph and fasting gastrin level to be checked by GI on the same day for further evaluation. She has undergone capsule endoscopy at [facility] as well and I do not have any reports for my review. There is no evidence of multiple ulcerations noted on EGD.I will not be checking her gastrin levels as this is a primary GI issue and needs to be followed up by them. She also has chronic abdominal pain and multiple ER visits which needs to be followed by GI.   Visit Diagnosis 1. Iron deficiency anemia, unspecified iron deficiency anemia type   2. Elevated gastrin level      Dr. Randa Evens, MD, MPH [facility] at [facility] [phone] 05/28/2018 2:55 PM

## 2018-05-29 ENCOUNTER — Telehealth: Payer: Self-pay | Admitting: *Deleted

## 2018-05-29 ENCOUNTER — Ambulatory Visit
Admission: RE | Admit: 2018-05-29 | Discharge: 2018-05-29 | Disposition: A | Discharge status: Home / Self Care | Payer: Medicaid Other | Source: Ambulatory Visit | Attending: Orthopedic Surgery | Admitting: Orthopedic Surgery

## 2018-05-29 NOTE — Telephone Encounter (Signed)
Faxed notes to Dr. Alice Reichert about things to be done for pt through GI. Patient still having lots of abdominal pain

## 2018-06-01 DIAGNOSIS — M179 Osteoarthritis of knee, unspecified: Secondary | ICD-10-CM | POA: Insufficient documentation

## 2018-06-01 DIAGNOSIS — M1711 Unilateral primary osteoarthritis, right knee: Secondary | ICD-10-CM | POA: Insufficient documentation

## 2018-06-12 ENCOUNTER — Other Ambulatory Visit (HOSPITAL_COMMUNITY): Payer: Self-pay | Admitting: Neurology

## 2018-06-12 DIAGNOSIS — G44221 Chronic tension-type headache, intractable: Secondary | ICD-10-CM

## 2018-06-12 DIAGNOSIS — G43119 Migraine with aura, intractable, without status migrainosus: Secondary | ICD-10-CM

## 2018-06-17 ENCOUNTER — Emergency Department
Admission: EM | Admit: 2018-06-17 | Discharge: 2018-06-17 | Disposition: A | Discharge status: Home / Self Care | Payer: Medicaid Other | Attending: Emergency Medicine | Admitting: Emergency Medicine

## 2018-06-17 ENCOUNTER — Other Ambulatory Visit: Payer: Self-pay

## 2018-06-17 DIAGNOSIS — I5022 Chronic systolic (congestive) heart failure: Secondary | ICD-10-CM | POA: Diagnosis not present

## 2018-06-17 DIAGNOSIS — R252 Cramp and spasm: Secondary | ICD-10-CM | POA: Diagnosis present

## 2018-06-17 DIAGNOSIS — Z79899 Other long term (current) drug therapy: Secondary | ICD-10-CM | POA: Diagnosis not present

## 2018-06-17 DIAGNOSIS — Z91040 Latex allergy status: Secondary | ICD-10-CM | POA: Diagnosis not present

## 2018-06-17 DIAGNOSIS — Z853 Personal history of malignant neoplasm of breast: Secondary | ICD-10-CM | POA: Diagnosis not present

## 2018-06-17 DIAGNOSIS — E876 Hypokalemia: Secondary | ICD-10-CM | POA: Diagnosis not present

## 2018-06-17 DIAGNOSIS — J449 Chronic obstructive pulmonary disease, unspecified: Secondary | ICD-10-CM | POA: Diagnosis not present

## 2018-06-17 LAB — BASIC METABOLIC PANEL
ANION GAP: 13 (ref 5–15)
BUN: 23 mg/dL — ABNORMAL HIGH (ref 6–20)
CALCIUM: 9 mg/dL (ref 8.9–10.3)
CO2: 28 mmol/L (ref 22–32)
Chloride: 99 mmol/L (ref 98–111)
Creatinine, Ser: 1.15 mg/dL — ABNORMAL HIGH (ref 0.44–1.00)
GFR calc non Af Amer: 55 mL/min — ABNORMAL LOW (ref >60)
GLUCOSE: 97 mg/dL (ref 70–99)
POTASSIUM: 3 mmol/L — AB (ref 3.5–5.1)
SODIUM: 140 mmol/L (ref 135–145)

## 2018-06-17 LAB — MAGNESIUM: Magnesium: 2.3 mg/dL (ref 1.7–2.4)

## 2018-06-17 MED ORDER — POTASSIUM CHLORIDE CRYS ER 20 MEQ PO TBCR
40.0000 meq | EXTENDED_RELEASE_TABLET | Freq: Once | ORAL | Status: AC
  Administered 2018-06-17: 40 meq via ORAL
  Filled 2018-06-17: qty 2

## 2018-06-17 MED ORDER — POTASSIUM CHLORIDE CRYS ER 20 MEQ PO TBCR: 20.0000 meq | EXTENDED_RELEASE_TABLET | Freq: Two times a day (BID) | ORAL | 0 refills | Status: DC

## 2018-06-17 MED ORDER — DIAZEPAM 5 MG PO TABS
5.0000 mg | ORAL_TABLET | Freq: Once | ORAL | Status: AC
  Administered 2018-06-17: 5 mg via ORAL
  Filled 2018-06-17: qty 1

## 2018-06-17 NOTE — ED Triage Notes (Signed)
Patient reports bilateral leg cramps for 2-3 days.

## 2018-06-17 NOTE — ED Provider Notes (Signed)
[facility] Emergency Department Provider Note  ____________________________________________   First MD Initiated Contact with Patient 06/17/18 [phone]     (approximate)  I have reviewed the triage vital signs and the nursing notes.   HISTORY  Chief Complaint Leg Pain    HPI Brittany Nguyen is a 52 y.o. female with medical history as listed below who has had 11 ED visits in 6 months with 4 admissions.  She presents tonight by private vehicle for evaluation of several days of severe bilateral leg pain that comes and goes and feels like severe cramping.  Nothing in particular makes it better or worse.  She reports having had issues for an extended period of time regulating her electrolytes, most notably her potassium.  She has not been exercising and has had no new or different medications recently.  Nothing in particular makes her symptoms better or worse and the cramping and spasming in her bilateral calves can be quite severe at times.  She reports that it is currently severe in the left calf and that she can feel a cramping right now.  She denies fever/chills, chest pain, shortness of breath, nausea, vomiting, and abdominal pain.  Past Medical History:  Diagnosis Date  . Asthma 2013  . Breast cancer ([place]) 2014   right side  . CHF (congestive heart failure) ([place])   . Diverticulitis 2010  . DVT (deep venous thrombosis) ([place])   . Endometriosis 1990  . Heart disease 2013  . Hx MRSA infection   . Lump or mass in breast   . Restless leg     Patient Active Problem List   Diagnosis Date Noted  . Iron deficiency anemia 03/27/2018  . Anemia 03/26/2018  . Abdominal pain 02/20/2018  . Unstable angina ([place])   . Encounter for anticoagulation discussion and counseling   . C. difficile colitis 01/14/2018  . GIB (gastrointestinal bleeding) 01/08/2018  . SOB (shortness of breath) 11/01/2017  . Influenza A 10/29/2017  . Acute respiratory failure ([place])  09/24/2017  . Sepsis ([place]) 07/03/2017  . COPD (chronic obstructive pulmonary disease) ([place]) 04/26/2017  . Community acquired pneumonia 04/25/2017  . Chronic systolic heart failure ([place]) 02/03/2017  . Hypotension 02/03/2017  . Chest pain 12/09/2016  . RLS (restless legs syndrome) 12/09/2016  . Cough, persistent 11/22/2015  . Hypokalemia 11/22/2015  . Leukocytosis 11/22/2015  . Dyspnea 11/21/2015  . Respiratory distress 09/19/2015  . Breast pain 01/11/2013  . Hx MRSA infection   . Lump or mass in breast   . GOITER, MULTINODULAR 01/20/2009  . HYPOGLYCEMIA, UNSPECIFIED 01/20/2009  . GERD 01/20/2009  . DIVERTICULITIS OF COLON 01/20/2009    Past Surgical History:  Procedure Laterality Date  . ABDOMINAL HYSTERECTOMY     age 32  . CARDIAC DEFIBRILLATOR PLACEMENT    . CESAREAN SECTION    . CHOLECYSTECTOMY    . COLECTOMY    . COLONOSCOPY WITH PROPOFOL N/A 02/22/2018   Procedure: COLONOSCOPY WITH PROPOFOL;  Surgeon: Toledo, Benay Pike, MD;  Location: [facility] ENDOSCOPY;  Service: Gastroenterology;  Laterality: N/A;  . ESOPHAGOGASTRODUODENOSCOPY (EGD) WITH PROPOFOL N/A 02/22/2018   Procedure: ESOPHAGOGASTRODUODENOSCOPY (EGD) WITH PROPOFOL;  Surgeon: Toledo, Benay Pike, MD;  Location: [facility] ENDOSCOPY;  Service: Gastroenterology;  Laterality: N/A;  . NASAL SINUS SURGERY  2012    Prior to Admission medications   Medication Sig Start Date End Date Taking? Authorizing Provider  albuterol (ACCUNEB) 1.25 MG/3ML nebulizer solution Take 3 mLs by nebulization every 6 (six) hours as needed for  wheezing. 11/09/17   [provider]  beclomethasone (QVAR) 80 MCG/ACT inhaler Inhale 2 puffs into the lungs 2 (two) times daily. 06/05/17 06/05/18  Laverle Hobby, MD  butalbital-acetaminophen-caffeine (FIORICET, ESGIC) [phone] MG tablet Take 1-2 tablets by mouth every 6 (six) hours as needed for headache. 05/04/18 05/04/19  Harvest Dark, MD  cyanocobalamin 500 MCG tablet Take 500 mcg by mouth  daily.    [provider]  ferrous sulfate 325 (65 FE) MG tablet Take 325 mg by mouth daily with breakfast.    [provider]  ibuprofen (ADVIL,MOTRIN) 800 MG tablet Take 1 tablet (800 mg total) by mouth every 8 (eight) hours as needed for moderate pain. 05/25/18   Paulette Blanch, MD  levothyroxine (SYNTHROID, LEVOTHROID) 150 MCG tablet Take 150 mcg by mouth daily before breakfast.    [provider]  losartan (COZAAR) 25 MG tablet Take 12.5 mg by mouth 2 (two) times daily.    [provider]  metoprolol succinate (TOPROL-XL) 100 MG 24 hr tablet Take 1 tablet (100 mg total) by mouth daily. Take with or immediately following a meal. 01/16/18   Loletha Grayer, MD  Multiple Vitamin (MULTIVITAMIN WITH MINERALS) TABS tablet Take 1 tablet by mouth daily.    [provider]  nitroGLYCERIN (NITROSTAT) 0.4 MG SL tablet Place 1 tablet (0.4 mg total) under the tongue every 5 (five) minutes as needed for chest pain. 01/24/17   Henreitta Leber, MD  potassium chloride SA (KLOR-CON M20) 20 MEQ tablet Take 1 tablet (20 mEq total) by mouth 2 (two) times daily for 7 days. 06/17/18 06/24/18  Hinda Kehr, MD  promethazine (PHENERGAN) 25 MG tablet Take 1 tablet (25 mg total) by mouth every 6 (six) hours as needed for nausea or vomiting. 05/25/18   Paulette Blanch, MD  rOPINIRole (REQUIP) 2 MG tablet Take 2 tablets (4 mg total) by mouth at bedtime. 2-4  Mg per night Patient taking differently: Take 4 mg by mouth at bedtime.  01/16/18   Loletha Grayer, MD  sertraline (ZOLOFT) 50 MG tablet Take 50 mg by mouth daily.    [provider]  spironolactone (ALDACTONE) 50 MG tablet Take 50 mg by mouth daily.    [provider]  traMADol (ULTRAM) 50 MG tablet Take 50 mg by mouth every 6 (six) hours as needed.    [provider]    Allergies Contrast media [iodinated diagnostic agents]; Metrizamide; Penicillins; Isosorbide nitrate; Latex; Ondansetron; Zofran  [ondansetron hcl]; Povidone-iodine; and Pulmicort [budesonide]  Family History  Problem Relation Age of Onset  . Cancer Mother 30       ovarian  . CAD Mother   . Cancer Father 38       brain  . CAD Father   . Cancer Daughter 18       skin  . Cancer Maternal Aunt 58       breast  . Leukemia Paternal Grandfather     Social History Social History   Tobacco Use  . Smoking status: Never Smoker  . Smokeless tobacco: Never Used  Substance Use Topics  . Alcohol use: No  . Drug use: No    Review of Systems Constitutional: No fever/chills Eyes: No visual changes. ENT: No sore throat. Cardiovascular: Denies chest pain. Respiratory: Denies shortness of breath. Gastrointestinal: No abdominal pain.  No nausea, no vomiting.  No diarrhea.  No constipation. Genitourinary: Negative for dysuria. Musculoskeletal: Severe bilateral lower leg cramping for several days intermittently as described above  Integumentary: Negative for rash. Neurological: Negative for headaches, focal weakness or numbness.   ____________________________________________   PHYSICAL EXAM:  VITAL SIGNS: ED Triage Vitals [06/17/18 0113]  Enc Vitals Group     BP (!) 146/110     Pulse Rate 93     Resp (!) 22     Temp 98.2 F (36.8 C)     Temp Source Oral     SpO2 96 %     Weight      Height      Head Circumference      Peak Flow      Pain Score      Pain Loc      Pain Edu?      Excl. in [place]?     Constitutional: Alert and oriented.  Patient does appear to be acutely uncomfortable Eyes: Conjunctivae are normal.  Head: Atraumatic. Nose: No congestion/rhinnorhea. Mouth/Throat: Mucous membranes are moist. Neck: No stridor.  No meningeal signs.   Cardiovascular: Normal rate, regular rhythm. Good peripheral circulation. Grossly normal heart sounds. Respiratory: Normal respiratory effort.  No retractions. Lungs CTAB. Gastrointestinal: Soft and nontender. No distention.  Musculoskeletal: No palpable cords  along the venous distribution in the popliteal fossa.  Some tenderness to palpation of the right calf but the left calf is markedly contracted and seems to be spasming and is very tender to palpation as well as with any movement.  There is no pitting edema. Neurologic:  Normal speech and language. No gross focal neurologic deficits are appreciated.  Skin:  Skin is warm, dry and intact. No rash noted.  ____________________________________________   LABS (all labs ordered are listed, but only abnormal results are displayed)  Labs Reviewed  BASIC METABOLIC PANEL - Abnormal; Notable for the following components:      Result Value   Potassium 3.0 (*)    BUN 23 (*)    Creatinine, Ser 1.15 (*)    GFR calc non Af Amer 55 (*)    All other components within normal limits  MAGNESIUM   ____________________________________________  EKG  No indication for EKG ____________________________________________  RADIOLOGY   ED MD interpretation:  No indication for imaging  Official radiology report(s): No results found.  ____________________________________________   PROCEDURES  Critical Care performed: No   Procedure(s) performed:   Procedures   ____________________________________________   INITIAL IMPRESSION / ASSESSMENT AND PLAN / ED COURSE  As part of my medical decision making, I reviewed the following data within the [place] notes reviewed and incorporated, Labs reviewed , Old chart reviewed and Notes from prior ED visits and reviewed the [place] controlled substance database.    Differential diagnosis includes but is not limited to electrolyte abnormality leading to muscle cramps, much less likely DVT, trauma also possible but unlikely based on history.  The patient has history of hypokalemia.  DVT is unlikely given the cramping nature of the pain and the fact that it is bilateral.  She has no swelling and I can see the muscles cramping in  her left calf.  I will treat her with Valium 5 mg by mouth and await the results of her metabolic panel and magnesium level to see if she needs any repletion.  She is in agreement with the plan.  Clinical Course as of Jun 17 532  Nancy Fetter Jun 17, 2018  0527 Labs/electrolytes are only notable for potassium of 3.0.  The patient states she already takes a potassium supplement twice a day but  I encouraged her to double up for the next couple of days and I gave her potassium 40 mill equivalents by mouth tonight.  She is feeling much better after the Valium.  I checked the drug database and she has quite a few prescriptions for various types of narcotics in  her history with a moderate overdose risk score of 500, although she has had no new prescriptions over the last couple of weeks. However I am more comfortable treating the electrolyte disturbance rather than prescribing any benzos or opioids.  She can follow-up as an outpatient.  Return to the emergency department if you develop new or worsening symptoms that concern you.   [CF]  [phone] Of note, the patient is ambulatory without any apparent difficulty   [CF]    Clinical Course User Index [CF] Hinda Kehr, MD    ____________________________________________  FINAL CLINICAL IMPRESSION(S) / ED DIAGNOSES  Final diagnoses:  Muscle cramps  Hypokalemia     MEDICATIONS GIVEN DURING THIS VISIT:  Medications  diazepam (VALIUM) tablet 5 mg (5 mg Oral Given 06/17/18 0424)  potassium chloride SA (K-DUR,KLOR-CON) CR tablet 40 mEq (40 mEq Oral Given 06/17/18 0449)     ED Discharge Orders         Ordered    potassium chloride SA (KLOR-CON M20) 20 MEQ tablet  2 times daily     06/17/18 0531           Note:  This document was prepared using Dragon voice recognition software and may include unintentional dictation errors.    Hinda Kehr, MD 06/17/18 [phone]

## 2018-06-18 ENCOUNTER — Telehealth: Payer: Self-pay | Admitting: *Deleted

## 2018-06-18 NOTE — Telephone Encounter (Signed)
-----   Message from Sindy Guadeloupe, MD sent at 06/18/2018 10:09 AM EDT ----- Regarding: RE: pain/ anemic Contact: [phone] I dont see that a cbc was checked since 9/23 when I saw her. Her hb was normal at that time  With regards to her potassium, magnesium cramping she needs to call her her PCP. Her potassium was low but magnesium was normal.  If she has abdominal pain that would be her outpatient GI.  Thanks, Astrid Divine ----- Message ----- From: Secundino Ginger Sent: 06/18/2018   9:47 AM EDT To: Luella Cook, RN, Sindy Guadeloupe, MD, # Subject: pain/ anemic                                   Brittany Nguyen went to ER Sat and they told her she is anemic, potassium extrermly low, cramping . ER told her tumor was causing her body not to be able to absorb the vitamins. They gave her a valium and sent her home. She said Tramadol is not working. She would like something diffferent or maybe see pt for evaluation. Let me know.

## 2018-06-18 NOTE — Telephone Encounter (Signed)
Pt states that she went to ER last evening and got potassium po and valium for muscle cramping and pain. She states the valium is not helping for pain and needs something different. I explained to her that she came to see Janese Banks for anemia and her hgb was normal on 9/23.  We sent notes to Dr. Alice Reichert office after both visits with our office and Dr. Janese Banks rec: things to do from GI standpoint for her atrophic gastritis.  The patient has not seen patient since 7/2 or 7/3. I told her that I would call their office and see what I can do to help. I would also call Dr. Kary Kos office to see what they can do. [place] office says that pt has an appt tom. At 9:45.  I spoke to [place] at [place] office and pt does not have an appt until 11/7. I told Estill Bamberg about the frequest Er visits for cramping and abd. Pain and they need to see her sooner. She states she got the faxes that I sent her while on the phone and will ask [place] tom about getting her in sooner.  She will call me back and let me know. I called back to pt and let her know above and let her know. The patient states that in the past when she calls and office the person tells her she needs to see another provider and some providers she calls, she never gets call back from some providers. I told her that I will check back tom. With GI and let her know what ith outcome is and that I apologize for her frustrations that she has had.

## 2018-06-19 ENCOUNTER — Telehealth: Payer: Self-pay | Admitting: *Deleted

## 2018-06-19 ENCOUNTER — Other Ambulatory Visit: Payer: Self-pay | Admitting: Family Medicine

## 2018-06-19 DIAGNOSIS — Z1231 Encounter for screening mammogram for malignant neoplasm of breast: Secondary | ICD-10-CM

## 2018-06-19 MED ORDER — PROMETHAZINE HCL 25 MG PO TABS
25.00 | ORAL_TABLET | ORAL | Status: DC
Start: ? — End: 2018-06-19

## 2018-06-19 NOTE — Telephone Encounter (Signed)
Called patient to let her know that I have gotten her a GI appointment for this Thursday at 11:00 to see Dr. Alice Reichert.  I asked the patient how her appointment with the primary care went today.  The patient states that she went to [facility] emergency room last night and stayed most of the night.  She overslept for the appointment and when she called the PCP office they told her that she had already missed her appointment would have to reschedule.  Patient states the vitamin D level was down at the ER and that is 1 of the reason why she is cramping but her body cannot absorb it and she needs to be seen by GI doctor.  Patient understands about her appointment to see GI and hopefully that will help her with her problems.

## 2018-06-20 DIAGNOSIS — G43119 Migraine with aura, intractable, without status migrainosus: Secondary | ICD-10-CM | POA: Insufficient documentation

## 2018-06-21 ENCOUNTER — Other Ambulatory Visit
Admission: RE | Admit: 2018-06-21 | Discharge: 2018-06-21 | Disposition: A | Discharge status: Home / Self Care | Payer: Medicaid Other | Source: Ambulatory Visit | Attending: Internal Medicine | Admitting: Internal Medicine

## 2018-06-21 DIAGNOSIS — K529 Noninfective gastroenteritis and colitis, unspecified: Secondary | ICD-10-CM | POA: Insufficient documentation

## 2018-06-21 DIAGNOSIS — Z8619 Personal history of other infectious and parasitic diseases: Secondary | ICD-10-CM | POA: Diagnosis present

## 2018-06-21 LAB — C DIFFICILE QUICK SCREEN W PCR REFLEX
C DIFFICILE (CDIFF) INTERP: NOT DETECTED
C DIFFICILE (CDIFF) TOXIN: NEGATIVE
C Diff antigen: NEGATIVE

## 2018-06-21 LAB — GASTROINTESTINAL PANEL BY PCR, STOOL (REPLACES STOOL CULTURE)

## 2018-06-21 LAB — LACTOFERRIN, FECAL, QUALITATIVE: LACTOFERRIN, FECAL, QUAL: POSITIVE — AB

## 2018-06-22 ENCOUNTER — Emergency Department
Admission: EM | Admit: 2018-06-22 | Discharge: 2018-06-22 | Discharge status: Against Medical Advice | Payer: Medicaid Other | Attending: Emergency Medicine | Admitting: Emergency Medicine

## 2018-06-22 ENCOUNTER — Other Ambulatory Visit: Payer: Self-pay | Admitting: Family Medicine

## 2018-06-22 ENCOUNTER — Other Ambulatory Visit: Payer: Self-pay

## 2018-06-22 ENCOUNTER — Encounter: Payer: Self-pay | Admitting: Emergency Medicine

## 2018-06-22 ENCOUNTER — Ambulatory Visit (HOSPITAL_COMMUNITY)
Admission: RE | Admit: 2018-06-22 | Discharge: 2018-06-22 | Disposition: A | Discharge status: Home / Self Care | Payer: Medicaid Other | Source: Ambulatory Visit | Attending: Neurology | Admitting: Neurology

## 2018-06-22 DIAGNOSIS — R109 Unspecified abdominal pain: Secondary | ICD-10-CM | POA: Diagnosis not present

## 2018-06-22 DIAGNOSIS — N63 Unspecified lump in unspecified breast: Secondary | ICD-10-CM

## 2018-06-22 DIAGNOSIS — G44221 Chronic tension-type headache, intractable: Secondary | ICD-10-CM

## 2018-06-22 DIAGNOSIS — G43119 Migraine with aura, intractable, without status migrainosus: Secondary | ICD-10-CM | POA: Diagnosis not present

## 2018-06-22 DIAGNOSIS — Z5321 Procedure and treatment not carried out due to patient leaving prior to being seen by health care provider: Secondary | ICD-10-CM | POA: Insufficient documentation

## 2018-06-22 LAB — COMPREHENSIVE METABOLIC PANEL
ALT: 40 U/L (ref 0–44)
AST: 34 U/L (ref 15–41)
Albumin: 4.1 g/dL (ref 3.5–5.0)
Alkaline Phosphatase: 123 U/L (ref 38–126)
Anion gap: 9 (ref 5–15)
BILIRUBIN TOTAL: 0.2 mg/dL — AB (ref 0.3–1.2)
BUN: 18 mg/dL (ref 6–20)
CALCIUM: 8 mg/dL — AB (ref 8.9–10.3)
CHLORIDE: 104 mmol/L (ref 98–111)
CO2: 28 mmol/L (ref 22–32)
CREATININE: 1.17 mg/dL — AB (ref 0.44–1.00)
GFR, EST NON AFRICAN AMERICAN: 53 mL/min — AB (ref >60)
Glucose, Bld: 127 mg/dL — ABNORMAL HIGH (ref 70–99)
Potassium: 2.9 mmol/L — ABNORMAL LOW (ref 3.5–5.1)
Sodium: 141 mmol/L (ref 135–145)
TOTAL PROTEIN: 7.5 g/dL (ref 6.5–8.1)

## 2018-06-22 LAB — URINALYSIS, COMPLETE (UACMP) WITH MICROSCOPIC
BILIRUBIN URINE: NEGATIVE
Bacteria, UA: NONE SEEN
Glucose, UA: NEGATIVE mg/dL
HGB URINE DIPSTICK: NEGATIVE
KETONES UR: NEGATIVE mg/dL
LEUKOCYTES UA: NEGATIVE
Nitrite: NEGATIVE
PH: 5 (ref 5.0–8.0)
PROTEIN: NEGATIVE mg/dL
Specific Gravity, Urine: 1.008 (ref 1.005–1.030)

## 2018-06-22 LAB — CBC
HEMATOCRIT: 39.8 % (ref 36.0–46.0)
Hemoglobin: 13.8 g/dL (ref 12.0–15.0)
MCH: 29.8 pg (ref 26.0–34.0)
MCHC: 34.7 g/dL (ref 30.0–36.0)
MCV: 86 fL (ref 80.0–100.0)
NRBC: 0 % (ref 0.0–0.2)
PLATELETS: 246 10*3/uL (ref 150–400)
RBC: 4.63 MIL/uL (ref 3.87–5.11)
RDW: 16.1 % — AB (ref 11.5–15.5)
WBC: 12.5 10*3/uL — AB (ref 4.0–10.5)

## 2018-06-22 LAB — LIPASE, BLOOD: LIPASE: 42 U/L (ref 11–51)

## 2018-06-22 NOTE — ED Triage Notes (Signed)
Pt reports abdominal pain since about 3pm today RUQ reports, pt reports leg pain, pt reports her GI MD prescribed some medicine for this condition not sure of name, reports abdominal pain has increased. Pt took some Tylenol prior to arrival, pt very sleepy while talking to RN

## 2018-06-24 ENCOUNTER — Other Ambulatory Visit: Payer: Self-pay

## 2018-06-24 ENCOUNTER — Emergency Department: Payer: Medicaid Other

## 2018-06-24 ENCOUNTER — Emergency Department
Admission: EM | Admit: 2018-06-24 | Discharge: 2018-06-24 | Disposition: A | Discharge status: Home / Self Care | Payer: Medicaid Other | Attending: Emergency Medicine | Admitting: Emergency Medicine

## 2018-06-24 DIAGNOSIS — Z91040 Latex allergy status: Secondary | ICD-10-CM | POA: Insufficient documentation

## 2018-06-24 DIAGNOSIS — R0602 Shortness of breath: Secondary | ICD-10-CM | POA: Insufficient documentation

## 2018-06-24 DIAGNOSIS — Z95810 Presence of automatic (implantable) cardiac defibrillator: Secondary | ICD-10-CM | POA: Insufficient documentation

## 2018-06-24 DIAGNOSIS — L03115 Cellulitis of right lower limb: Secondary | ICD-10-CM | POA: Insufficient documentation

## 2018-06-24 DIAGNOSIS — Z853 Personal history of malignant neoplasm of breast: Secondary | ICD-10-CM | POA: Insufficient documentation

## 2018-06-24 DIAGNOSIS — G2581 Restless legs syndrome: Secondary | ICD-10-CM | POA: Insufficient documentation

## 2018-06-24 DIAGNOSIS — I5022 Chronic systolic (congestive) heart failure: Secondary | ICD-10-CM | POA: Insufficient documentation

## 2018-06-24 DIAGNOSIS — J449 Chronic obstructive pulmonary disease, unspecified: Secondary | ICD-10-CM | POA: Diagnosis not present

## 2018-06-24 LAB — CBC
HEMATOCRIT: 42.5 % (ref 36.0–46.0)
Hemoglobin: 14.4 g/dL (ref 12.0–15.0)
MCH: 29 pg (ref 26.0–34.0)
MCHC: 33.9 g/dL (ref 30.0–36.0)
MCV: 85.7 fL (ref 80.0–100.0)
Platelets: 245 10*3/uL (ref 150–400)
RBC: 4.96 MIL/uL (ref 3.87–5.11)
RDW: 15.9 % — AB (ref 11.5–15.5)
WBC: 11 10*3/uL — AB (ref 4.0–10.5)
nRBC: 0 % (ref 0.0–0.2)

## 2018-06-24 LAB — TROPONIN I: Troponin I: <0.03 ng/mL (ref <0.03)

## 2018-06-24 LAB — BASIC METABOLIC PANEL
Anion gap: 10 (ref 5–15)
BUN: 15 mg/dL (ref 6–20)
CHLORIDE: 99 mmol/L (ref 98–111)
CO2: 31 mmol/L (ref 22–32)
CREATININE: 0.93 mg/dL (ref 0.44–1.00)
Calcium: 8.8 mg/dL — ABNORMAL LOW (ref 8.9–10.3)
GFR calc Af Amer: >60 mL/min (ref >60)
GFR calc non Af Amer: >60 mL/min (ref >60)
GLUCOSE: 99 mg/dL (ref 70–99)
Potassium: 3 mmol/L — ABNORMAL LOW (ref 3.5–5.1)
SODIUM: 140 mmol/L (ref 135–145)

## 2018-06-24 MED ORDER — HYDROMORPHONE HCL 1 MG/ML IJ SOLN
1.0000 mg | Freq: Once | INTRAMUSCULAR | Status: DC
  Filled 2018-06-24: qty 1

## 2018-06-24 MED ORDER — DIAZEPAM 5 MG PO TABS
10.0000 mg | ORAL_TABLET | Freq: Once | ORAL | Status: AC
  Administered 2018-06-24: 10 mg via ORAL
  Filled 2018-06-24: qty 2

## 2018-06-24 MED ORDER — HYDROMORPHONE HCL 1 MG/ML IJ SOLN
1.0000 mg | Freq: Once | INTRAMUSCULAR | Status: AC
  Administered 2018-06-24: 1 mg via INTRAVENOUS

## 2018-06-24 MED ORDER — CEPHALEXIN 250 MG PO CAPS: 250.0000 mg | ORAL_CAPSULE | Freq: Four times a day (QID) | ORAL | 0 refills | Status: AC

## 2018-06-24 MED ORDER — POTASSIUM CHLORIDE CRYS ER 20 MEQ PO TBCR
40.0000 meq | EXTENDED_RELEASE_TABLET | Freq: Once | ORAL | Status: AC
  Administered 2018-06-24: 40 meq via ORAL
  Filled 2018-06-24: qty 2

## 2018-06-24 NOTE — ED Triage Notes (Signed)
Pt states SOB that began during the night. Has COPD, asthma, CHF. Also has redness and knot noted to R inner calf. States leg has been cramping x 1 week. Refused wheelchair. Sounds congested.

## 2018-06-24 NOTE — ED Notes (Signed)
Patient taken to ultrasound.

## 2018-06-24 NOTE — ED Provider Notes (Signed)
[facility] Emergency Department Provider Note       Time seen: ----------------------------------------- 11:05 AM on 06/24/2018 -----------------------------------------  I have reviewed the triage vital signs and the nursing notes.  HISTORY   Chief Complaint Shortness of Breath   HPI Saveah Devany Aja is a 52 y.o. female with a history of asthma, CHF, diverticulitis, DVT, endometriosis, anemia who presents to the ED for shortness of breath began last night.  Patient reportedly with history as dictated above, has had frequent ER visits during the last year especially for same.  Patient states she just recently got back from a trip, had some right leg redness on her calf.  She has been having significant leg cramping bilaterally for the past week.  She was also recently seen in the ER for abdominal pain.  Past Medical History:  Diagnosis Date  . Asthma 2013  . Breast cancer ([place]) 2014   right side  . CHF (congestive heart failure) ([place])   . Diverticulitis 2010  . DVT (deep venous thrombosis) ([place])   . Endometriosis 1990  . Heart disease 2013  . Hx MRSA infection   . Lump or mass in breast   . Restless leg     Patient Active Problem List   Diagnosis Date Noted  . Iron deficiency anemia 03/27/2018  . Anemia 03/26/2018  . Abdominal pain 02/20/2018  . Unstable angina ([place])   . Encounter for anticoagulation discussion and counseling   . C. difficile colitis 01/14/2018  . GIB (gastrointestinal bleeding) 01/08/2018  . SOB (shortness of breath) 11/01/2017  . Influenza A 10/29/2017  . Acute respiratory failure ([place]) 09/24/2017  . Sepsis ([place]) 07/03/2017  . COPD (chronic obstructive pulmonary disease) ([place]) 04/26/2017  . Community acquired pneumonia 04/25/2017  . Chronic systolic heart failure ([place]) 02/03/2017  . Hypotension 02/03/2017  . Chest pain 12/09/2016  . RLS (restless legs syndrome) 12/09/2016  . Cough, persistent 11/22/2015  .  Hypokalemia 11/22/2015  . Leukocytosis 11/22/2015  . Dyspnea 11/21/2015  . Respiratory distress 09/19/2015  . Breast pain 01/11/2013  . Hx MRSA infection   . Lump or mass in breast   . GOITER, MULTINODULAR 01/20/2009  . HYPOGLYCEMIA, UNSPECIFIED 01/20/2009  . GERD 01/20/2009  . DIVERTICULITIS OF COLON 01/20/2009    Past Surgical History:  Procedure Laterality Date  . ABDOMINAL HYSTERECTOMY     age 82  . CARDIAC DEFIBRILLATOR PLACEMENT    . CESAREAN SECTION    . CHOLECYSTECTOMY    . COLECTOMY    . COLONOSCOPY WITH PROPOFOL N/A 02/22/2018   Procedure: COLONOSCOPY WITH PROPOFOL;  Surgeon: Toledo, Benay Pike, MD;  Location: [facility] ENDOSCOPY;  Service: Gastroenterology;  Laterality: N/A;  . ESOPHAGOGASTRODUODENOSCOPY (EGD) WITH PROPOFOL N/A 02/22/2018   Procedure: ESOPHAGOGASTRODUODENOSCOPY (EGD) WITH PROPOFOL;  Surgeon: Toledo, Benay Pike, MD;  Location: [facility] ENDOSCOPY;  Service: Gastroenterology;  Laterality: N/A;  . NASAL SINUS SURGERY  2012    Allergies Contrast media [iodinated diagnostic agents]; Metrizamide; Penicillins; Isosorbide nitrate; Latex; Ondansetron; Zofran [ondansetron hcl]; Povidone-iodine; and Pulmicort [budesonide]  Social History Social History   Tobacco Use  . Smoking status: Never Smoker  . Smokeless tobacco: Never Used  Substance Use Topics  . Alcohol use: No  . Drug use: No   Review of Systems Constitutional: Negative for fever. Cardiovascular: Negative for chest pain. Respiratory: Positive for shortness of breath Gastrointestinal: Negative for abdominal pain, vomiting and diarrhea. Musculoskeletal: Positive right leg pain Skin: Positive right leg erythema Neurological: Negative for headaches, focal weakness or  numbness.  All systems negative/normal/unremarkable except as stated in the HPI  ____________________________________________   PHYSICAL EXAM:  VITAL SIGNS: ED Triage Vitals  Enc Vitals Group     BP 06/24/18 1056 110/86     Pulse Rate  06/24/18 1056 91     Resp 06/24/18 1056 20     Temp 06/24/18 1056 98.1 F (36.7 C)     Temp Source 06/24/18 1056 Oral     SpO2 06/24/18 1056 97 %     Weight 06/24/18 1057 220 lb (99.8 kg)     Height 06/24/18 1057 5\' 5"  (1.651 m)     Head Circumference --      Peak Flow --      Pain Score 06/24/18 1056 8     Pain Loc --      Pain Edu? --      Excl. in [place]? --    Constitutional: Alert and oriented. Well appearing and in no distress. Eyes: Conjunctivae are normal. Normal extraocular movements. Cardiovascular: Normal rate, regular rhythm. No murmurs, rubs, or gallops. Respiratory: Normal respiratory effort without tachypnea nor retractions. Breath sounds are clear and equal bilaterally. No wheezes/rales/rhonchi. Gastrointestinal: Soft and nontender. Normal bowel sounds Musculoskeletal: Nontender with normal range of motion in extremities.  Mild erythema located on the right medial calf Neurologic:  Normal speech and language. No gross focal neurologic deficits are appreciated.  Skin: Right calf erythema and tenderness as noted above Psychiatric: Mood and affect are normal. Speech and behavior are normal.  ____________________________________________  EKG: Interpreted by me.  Sinus rhythm rate 87 bpm, left anterior fascicular block, prolonged QT.  ____________________________________________  ED COURSE:  As part of my medical decision making, I reviewed the following data within the [place] History obtained from family if available, nursing notes, old chart and ekg, as well as notes from prior ED visits. Patient presented for shortness of breath, we will assess with labs and imaging as indicated at this time.   Procedures ____________________________________________   LABS (pertinent positives/negatives)  Labs Reviewed  BASIC METABOLIC PANEL - Abnormal; Notable for the following components:      Result Value   Potassium 3.0 (*)    Calcium 8.8 (*)    All  other components within normal limits  CBC - Abnormal; Notable for the following components:   WBC 11.0 (*)    RDW 15.9 (*)    All other components within normal limits  TROPONIN I    RADIOLOGY Images were viewed by me  Chest x-ray, right lower extremity ultrasound IMPRESSION: No evidence of deep venous thrombosis. IMPRESSION: No acute disease. Atherosclerosis. ____________________________________________  DIFFERENTIAL DIAGNOSIS   COPD, asthma, DVT, muscle cramp, cellulitis  FINAL ASSESSMENT AND PLAN  Restless leg syndrome, possible early cellulitis   Plan: The patient had presented for right lower extremity erythema and some shortness of breath. Patient's labs are unremarkable. Patient's imaging are grossly unremarkable as well.  Her main complaint here is actually restless legs and pain in her legs.  There is an area of erythema on her right medial calf which could be developing cellulitis although this is unclear.  She will be placed on Keflex.  I did give her Valium and Dilaudid for her leg pains which seem to be diffuse.  She also received a dose of potassium for borderline low potassium.   Laurence Aly, MD   Note: This note was generated in part or whole with voice recognition software. Voice recognition is usually quite  accurate but there are transcription errors that can and very often do occur. I apologize for any typographical errors that were not detected and corrected.     Earleen Newport, MD 06/24/18 1315

## 2018-06-24 NOTE — ED Notes (Signed)
Patient taken to xray.

## 2018-06-28 ENCOUNTER — Ambulatory Visit
Admission: RE | Admit: 2018-06-28 | Discharge: 2018-06-28 | Disposition: A | Discharge status: Home / Self Care | Payer: Medicaid Other | Source: Ambulatory Visit | Attending: Family Medicine | Admitting: Family Medicine

## 2018-06-28 DIAGNOSIS — N63 Unspecified lump in unspecified breast: Secondary | ICD-10-CM

## 2018-06-28 DIAGNOSIS — N6312 Unspecified lump in the right breast, upper inner quadrant: Secondary | ICD-10-CM | POA: Insufficient documentation

## 2018-07-10 IMAGING — CR DG CHEST 2V
2 series · 2 of 2 positions shown · non-contrast
Comparison: January 30, 2017

CLINICAL DATA: Mid upper chest pain.  Nonproductive cough.

EXAM:
CHEST  2 VIEW

[chest pa]
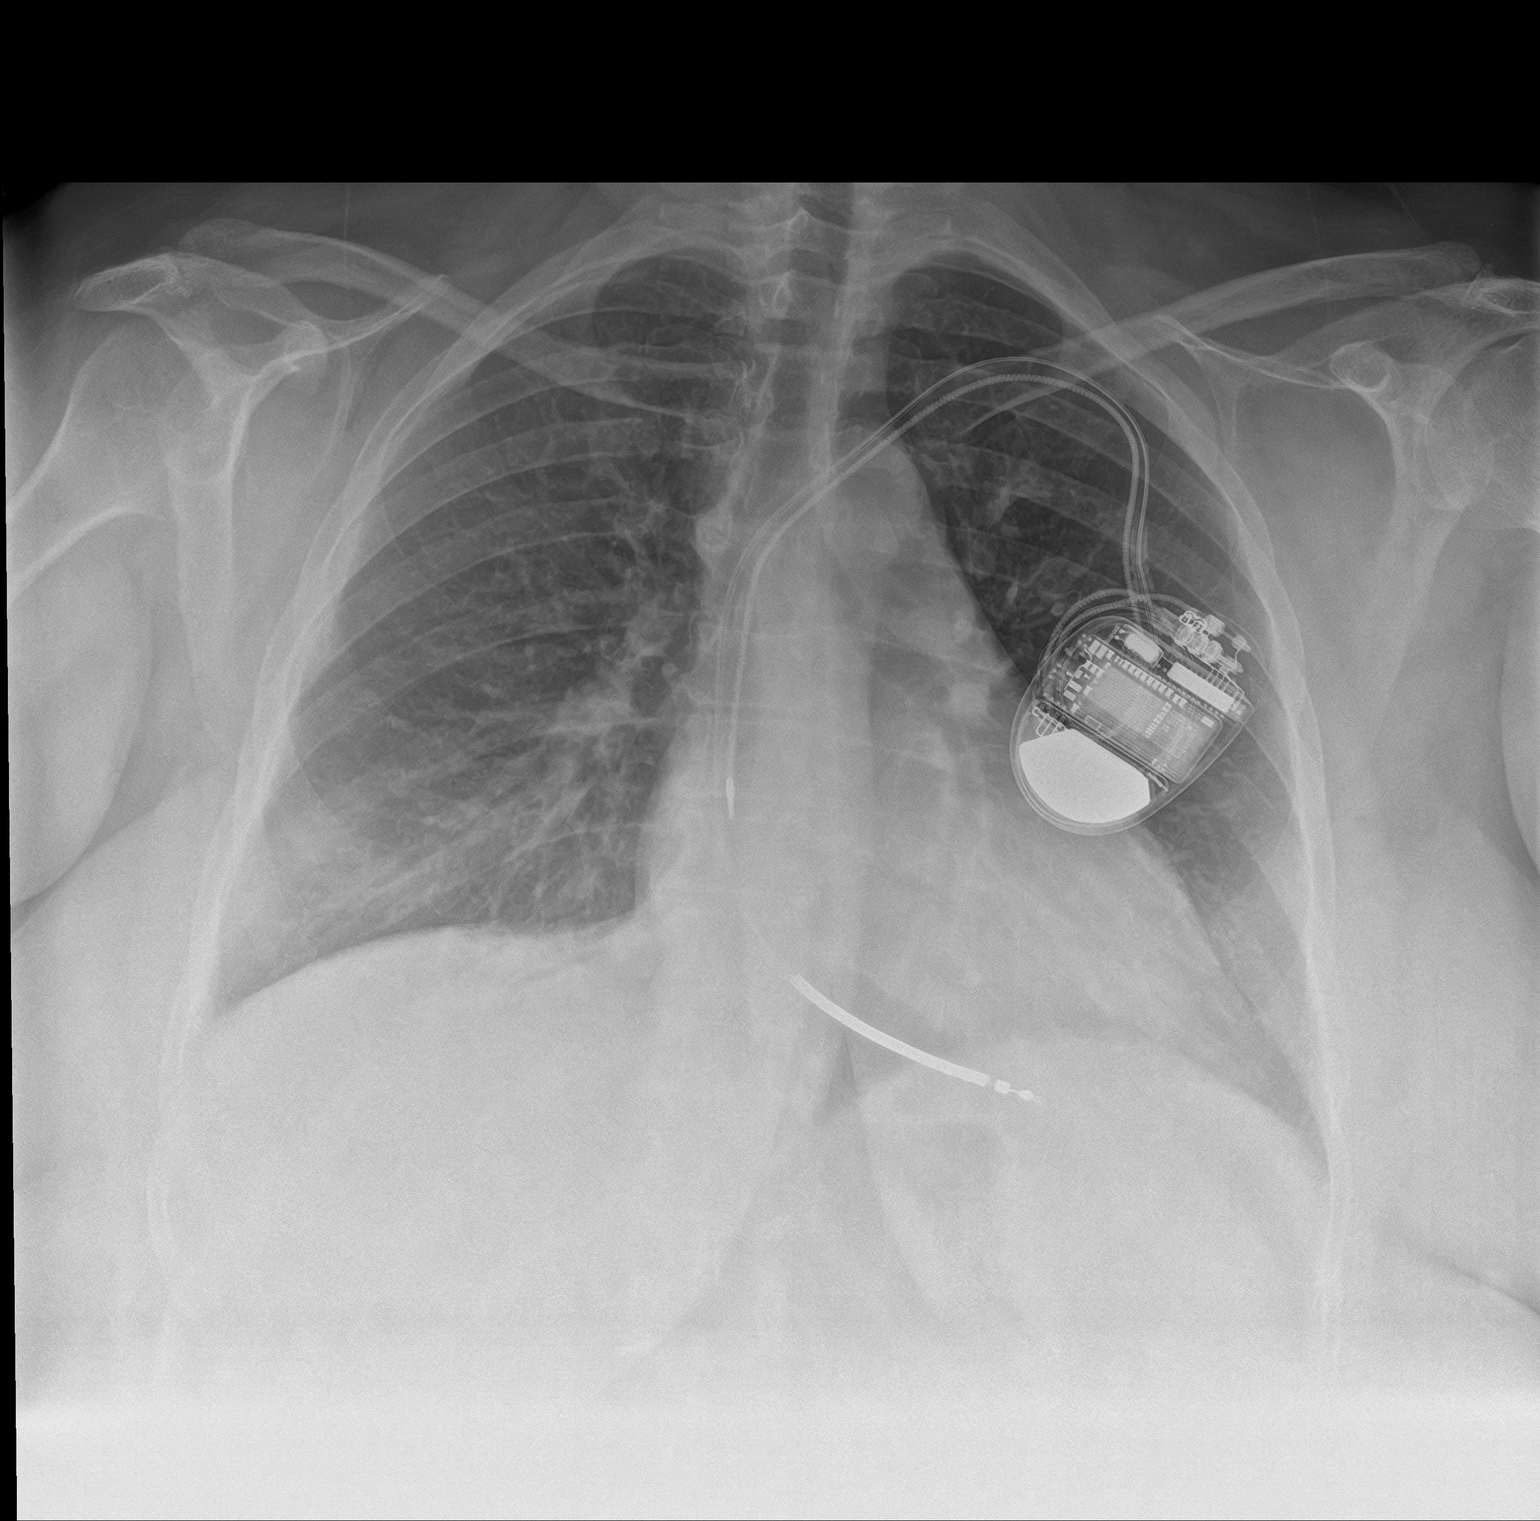

[chest lat]
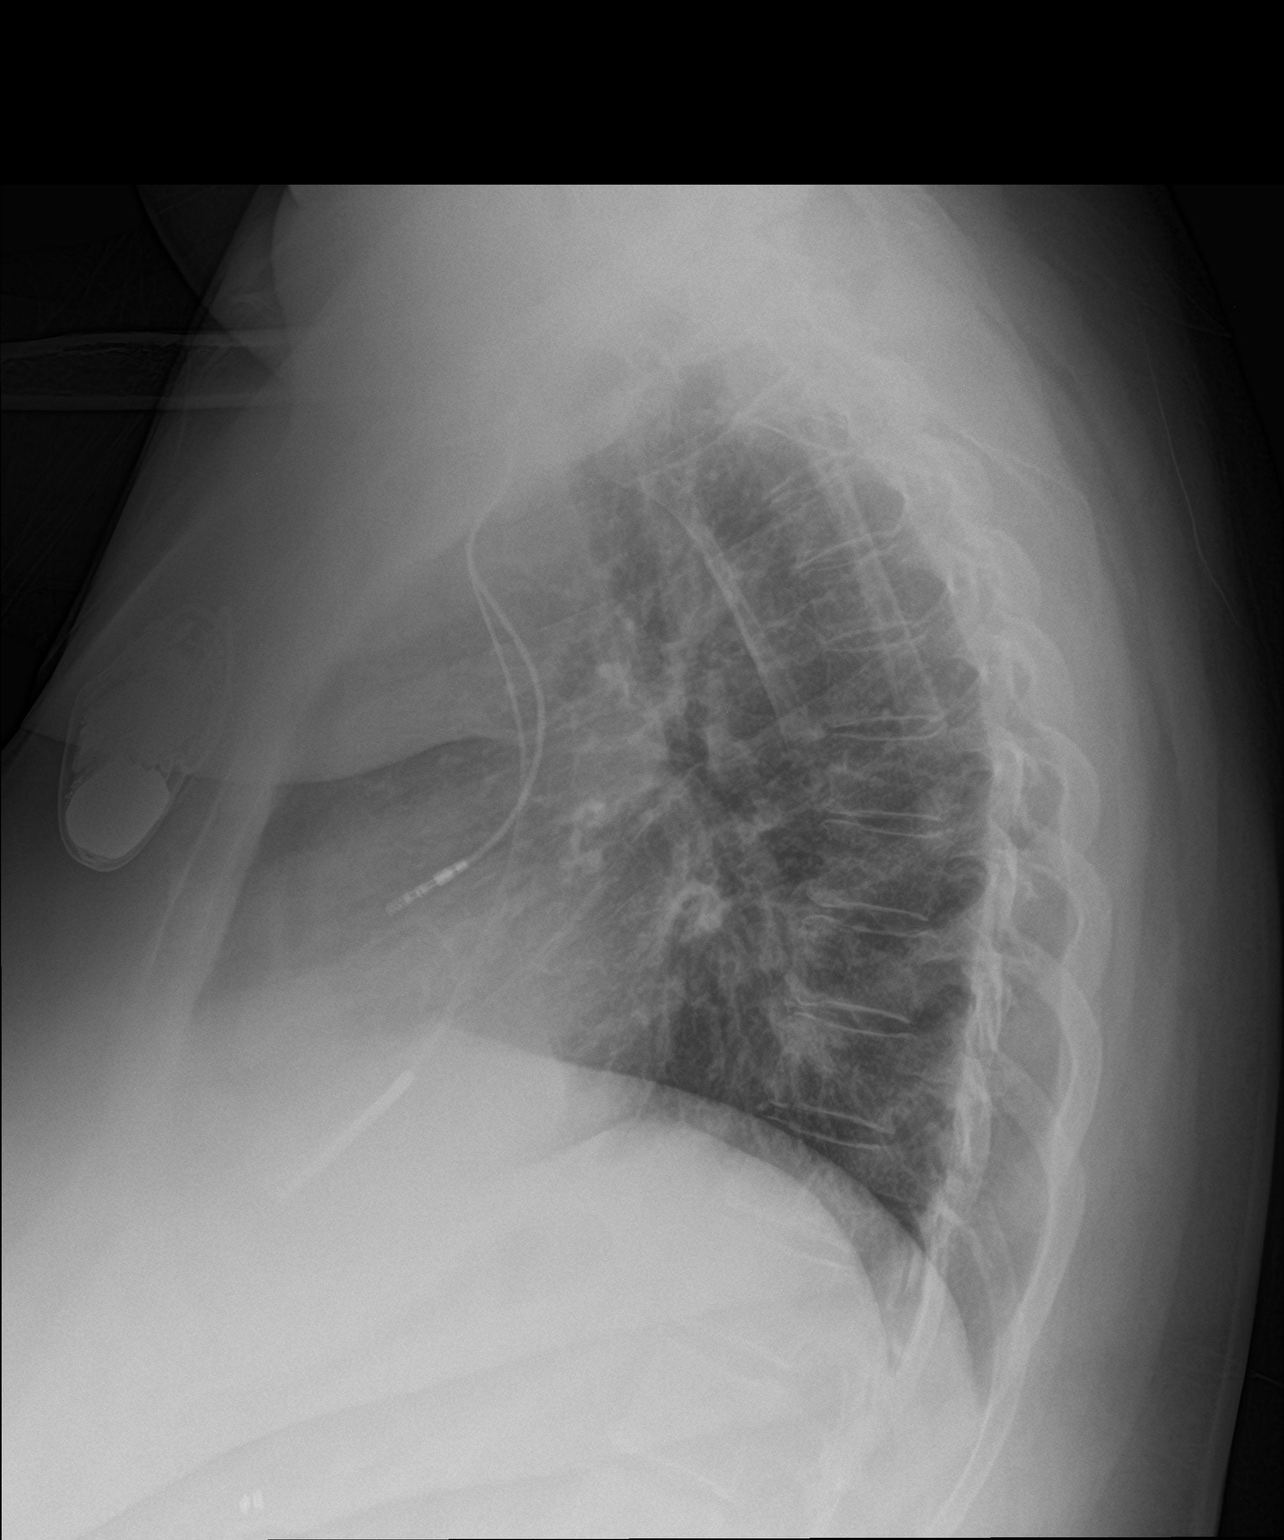

[2 of 2 positions shown; findings below may reference images not displayed]

FINDINGS: An AICD device has been placed in the interval with leads projected
over the right age been right ventricle. No pneumothorax. The
cardiomediastinal silhouette is stable. Mild haziness over lateral
right lung base is identified. No other abnormalities or changes.
IMPRESSION: 1. Mild haziness over the right lung base not seen previously may
represent atelectasis versus an early infiltrate. Recommend
follow-up as clinically warranted. No other abnormalities.

## 2018-07-23 ENCOUNTER — Emergency Department: Payer: Medicaid Other

## 2018-07-23 ENCOUNTER — Emergency Department
Admission: EM | Admit: 2018-07-23 | Discharge: 2018-07-23 | Disposition: A | Discharge status: Home / Self Care | Payer: Medicaid Other | Attending: Emergency Medicine | Admitting: Emergency Medicine

## 2018-07-23 ENCOUNTER — Encounter: Payer: Self-pay | Admitting: Emergency Medicine

## 2018-07-23 DIAGNOSIS — I5022 Chronic systolic (congestive) heart failure: Secondary | ICD-10-CM | POA: Insufficient documentation

## 2018-07-23 DIAGNOSIS — Z91040 Latex allergy status: Secondary | ICD-10-CM | POA: Diagnosis not present

## 2018-07-23 DIAGNOSIS — Z79899 Other long term (current) drug therapy: Secondary | ICD-10-CM | POA: Insufficient documentation

## 2018-07-23 DIAGNOSIS — J441 Chronic obstructive pulmonary disease with (acute) exacerbation: Secondary | ICD-10-CM | POA: Diagnosis not present

## 2018-07-23 DIAGNOSIS — Z95810 Presence of automatic (implantable) cardiac defibrillator: Secondary | ICD-10-CM | POA: Diagnosis not present

## 2018-07-23 DIAGNOSIS — J40 Bronchitis, not specified as acute or chronic: Secondary | ICD-10-CM | POA: Insufficient documentation

## 2018-07-23 DIAGNOSIS — R0602 Shortness of breath: Secondary | ICD-10-CM | POA: Diagnosis present

## 2018-07-23 HISTORY — DX: Chronic obstructive pulmonary disease, unspecified: J44.9

## 2018-07-23 HISTORY — DX: Iron deficiency: E61.1

## 2018-07-23 LAB — CBC WITH DIFFERENTIAL/PLATELET
ABS IMMATURE GRANULOCYTES: 0.15 10*3/uL — AB (ref 0.00–0.07)
BASOS PCT: 0 %
Basophils Absolute: 0.1 10*3/uL (ref 0.0–0.1)
EOS ABS: 0.2 10*3/uL (ref 0.0–0.5)
Eosinophils Relative: 2 %
HCT: 39.7 % (ref 36.0–46.0)
Hemoglobin: 13.4 g/dL (ref 12.0–15.0)
IMMATURE GRANULOCYTES: 1 %
Lymphocytes Relative: 26 %
Lymphs Abs: 3.2 10*3/uL (ref 0.7–4.0)
MCH: 30 pg (ref 26.0–34.0)
MCHC: 33.8 g/dL (ref 30.0–36.0)
MCV: 89 fL (ref 80.0–100.0)
Monocytes Absolute: 0.7 10*3/uL (ref 0.1–1.0)
Monocytes Relative: 5 %
NEUTROS ABS: 7.8 10*3/uL — AB (ref 1.7–7.7)
NEUTROS PCT: 66 %
NRBC: 0 % (ref 0.0–0.2)
PLATELETS: 247 10*3/uL (ref 150–400)
RBC: 4.46 MIL/uL (ref 3.87–5.11)
RDW: 15 % (ref 11.5–15.5)
WBC: 12 10*3/uL — AB (ref 4.0–10.5)

## 2018-07-23 LAB — TROPONIN I: Troponin I: <0.03 ng/mL (ref <0.03)

## 2018-07-23 LAB — COMPREHENSIVE METABOLIC PANEL
ALK PHOS: 112 U/L (ref 38–126)
ALT: 20 U/L (ref 0–44)
ANION GAP: 8 (ref 5–15)
AST: 18 U/L (ref 15–41)
Albumin: 3.8 g/dL (ref 3.5–5.0)
BILIRUBIN TOTAL: 0.5 mg/dL (ref 0.3–1.2)
BUN: 14 mg/dL (ref 6–20)
CALCIUM: 8.9 mg/dL (ref 8.9–10.3)
CO2: 31 mmol/L (ref 22–32)
Chloride: 102 mmol/L (ref 98–111)
Creatinine, Ser: 1.15 mg/dL — ABNORMAL HIGH (ref 0.44–1.00)
GFR, EST NON AFRICAN AMERICAN: 55 mL/min — AB (ref >60)
Glucose, Bld: 132 mg/dL — ABNORMAL HIGH (ref 70–99)
POTASSIUM: 3.1 mmol/L — AB (ref 3.5–5.1)
Sodium: 141 mmol/L (ref 135–145)
TOTAL PROTEIN: 7.6 g/dL (ref 6.5–8.1)

## 2018-07-23 LAB — BRAIN NATRIURETIC PEPTIDE: B NATRIURETIC PEPTIDE 5: 48 pg/mL (ref 0.0–100.0)

## 2018-07-23 LAB — INFLUENZA PANEL BY PCR (TYPE A & B)
INFLAPCR: NEGATIVE
Influenza B By PCR: NEGATIVE

## 2018-07-23 MED ORDER — IPRATROPIUM-ALBUTEROL 0.5-2.5 (3) MG/3ML IN SOLN
3.0000 mL | Freq: Once | RESPIRATORY_TRACT | Status: AC
  Administered 2018-07-23: 3 mL via RESPIRATORY_TRACT

## 2018-07-23 MED ORDER — SODIUM CHLORIDE 0.9 % IV BOLUS
1000.0000 mL | Freq: Once | INTRAVENOUS | Status: AC
  Administered 2018-07-23: 1000 mL via INTRAVENOUS

## 2018-07-23 MED ORDER — AZITHROMYCIN 250 MG PO TABS: ORAL_TABLET | ORAL | 0 refills | Status: DC

## 2018-07-23 MED ORDER — BENZONATATE 200 MG PO CAPS: 200.0000 mg | ORAL_CAPSULE | Freq: Four times a day (QID) | ORAL | 0 refills | Status: DC | PRN

## 2018-07-23 MED ORDER — METHYLPREDNISOLONE SODIUM SUCC 125 MG IJ SOLR
125.0000 mg | Freq: Once | INTRAMUSCULAR | Status: AC
  Administered 2018-07-23: 125 mg via INTRAVENOUS

## 2018-07-23 MED ORDER — PREDNISONE 50 MG PO TABS: 50.0000 mg | ORAL_TABLET | Freq: Every day | ORAL | 0 refills | Status: AC

## 2018-07-23 MED ORDER — MAGNESIUM SULFATE 2 GM/50ML IV SOLN
2.0000 g | Freq: Once | INTRAVENOUS | Status: AC
  Administered 2018-07-23: 2 g via INTRAVENOUS
  Filled 2018-07-23: qty 50

## 2018-07-23 MED ORDER — IPRATROPIUM-ALBUTEROL 0.5-2.5 (3) MG/3ML IN SOLN
RESPIRATORY_TRACT | Status: AC
  Filled 2018-07-23: qty 9

## 2018-07-23 MED ORDER — ALBUTEROL SULFATE (2.5 MG/3ML) 0.083% IN NEBU
5.0000 mg | INHALATION_SOLUTION | Freq: Once | RESPIRATORY_TRACT | Status: AC
  Administered 2018-07-23: 5 mg via RESPIRATORY_TRACT
  Filled 2018-07-23: qty 6

## 2018-07-23 MED ORDER — LIDOCAINE HCL (PF) 1 % IJ SOLN
5.0000 mL | Freq: Once | INTRAMUSCULAR | Status: AC
  Administered 2018-07-23: 5 mL via INTRADERMAL
  Filled 2018-07-23: qty 5

## 2018-07-23 MED ORDER — KETOROLAC TROMETHAMINE 30 MG/ML IJ SOLN
15.0000 mg | Freq: Once | INTRAMUSCULAR | Status: AC
  Administered 2018-07-23: 15 mg via INTRAVENOUS
  Filled 2018-07-23: qty 1

## 2018-07-23 MED ORDER — METHYLPREDNISOLONE SODIUM SUCC 125 MG IJ SOLR
INTRAMUSCULAR | Status: AC
  Filled 2018-07-23: qty 2

## 2018-07-23 NOTE — Discharge Instructions (Signed)
Fortunately today your lab work, your chest x-ray, and your EKG were reassuring.  Please begin taking azithromycin as prescribed and take steroids for the next 4 days.  Use Tessalon Perles for cough.  Please follow-up with your primary care physician in 2 days for recheck and return to the emergency department sooner for any concerns.  It was a pleasure to take care of you today, and thank you for coming to our emergency department.  If you have any questions or concerns before leaving please ask the nurse to grab me and I'm more than happy to go through your aftercare instructions again.  If you were prescribed any opioid pain medication today such as Norco, Vicodin, Percocet, morphine, hydrocodone, or oxycodone please make sure you do not drive when you are taking this medication as it can alter your ability to drive safely.  If you have any concerns once you are home that you are not improving or are in fact getting worse before you can make it to your follow-up appointment, please do not hesitate to call 911 and come back for further evaluation.  Darel Hong, MD  Results for orders placed or performed during the hospital encounter of 07/23/18  Comprehensive metabolic panel  Result Value Ref Range   Sodium 141 135 - 145 mmol/L   Potassium 3.1 (L) 3.5 - 5.1 mmol/L   Chloride 102 98 - 111 mmol/L   CO2 31 22 - 32 mmol/L   Glucose, Bld 132 (H) 70 - 99 mg/dL   BUN 14 6 - 20 mg/dL   Creatinine, Ser 1.15 (H) 0.44 - 1.00 mg/dL   Calcium 8.9 8.9 - 10.3 mg/dL   Total Protein 7.6 6.5 - 8.1 g/dL   Albumin 3.8 3.5 - 5.0 g/dL   AST 18 15 - 41 U/L   ALT 20 0 - 44 U/L   Alkaline Phosphatase 112 38 - 126 U/L   Total Bilirubin 0.5 0.3 - 1.2 mg/dL   GFR calc non Af Amer 55 (L) >60 mL/min   GFR calc Af Amer >60 >60 mL/min   Anion gap 8 5 - 15  CBC with Differential  Result Value Ref Range   WBC 12.0 (H) 4.0 - 10.5 K/uL   RBC 4.46 3.87 - 5.11 MIL/uL   Hemoglobin 13.4 12.0 - 15.0 g/dL   HCT 39.7  36.0 - 46.0 %   MCV 89.0 80.0 - 100.0 fL   MCH 30.0 26.0 - 34.0 pg   MCHC 33.8 30.0 - 36.0 g/dL   RDW 15.0 11.5 - 15.5 %   Platelets 247 150 - 400 K/uL   nRBC 0.0 0.0 - 0.2 %   Neutrophils Relative % 66 %   Neutro Abs 7.8 (H) 1.7 - 7.7 K/uL   Lymphocytes Relative 26 %   Lymphs Abs 3.2 0.7 - 4.0 K/uL   Monocytes Relative 5 %   Monocytes Absolute 0.7 0.1 - 1.0 K/uL   Eosinophils Relative 2 %   Eosinophils Absolute 0.2 0.0 - 0.5 K/uL   Basophils Relative 0 %   Basophils Absolute 0.1 0.0 - 0.1 K/uL   Immature Granulocytes 1 %   Abs Immature Granulocytes 0.15 (H) 0.00 - 0.07 K/uL  Brain natriuretic peptide  Result Value Ref Range   B Natriuretic Peptide 48.0 0.0 - 100.0 pg/mL  Troponin I - Once  Result Value Ref Range   Troponin I <0.03 <0.03 ng/mL  Blood gas, venous  Result Value Ref Range   FIO2 PENDING  Delivery systems PENDING    pH, Ven 7.45 (H) 7.250 - 7.430   pCO2, Ven 48 44.0 - 60.0 mmHg   pO2, Ven PENDING 32.0 - 45.0 mmHg   Bicarbonate 33.4 (H) 20.0 - 28.0 mmol/L   Acid-Base Excess 8.0 (H) 0.0 - 2.0 mmol/L   O2 Saturation PENDING %   Patient temperature 37.0    Collection site VENOUS    Sample type VENOUS   Influenza panel by PCR (type A & B)  Result Value Ref Range   Influenza A By PCR NEGATIVE NEGATIVE   Influenza B By PCR NEGATIVE NEGATIVE   Dg Chest 2 View  Result Date: 06/24/2018 CLINICAL DATA:  Lower leg pain today. EXAM: CHEST - 2 VIEW COMPARISON:  PA and lateral chest 05/25/2018 and 07/03/2017. FINDINGS: AICD is unchanged. Lungs clear. Heart size normal. Aortic atherosclerosis noted. No pneumothorax or pleural fluid. No acute or focal bony abnormality. IMPRESSION: No acute disease. Atherosclerosis. Electronically Signed   By: Inge Rise M.D.   On: 06/24/2018 12:32   US Venous Img Lower Unilateral Right  Result Date: 06/24/2018 CLINICAL DATA:  Pain, edema and discoloration of the right lower extremity. EXAM: RIGHT LOWER EXTREMITY VENOUS DOPPLER  ULTRASOUND TECHNIQUE: Gray-scale sonography with graded compression, as well as color Doppler and duplex ultrasound were performed to evaluate the lower extremity deep venous systems from the level of the common femoral vein and including the common femoral, femoral, profunda femoral, popliteal and calf veins including the posterior tibial, peroneal and gastrocnemius veins when visible. The superficial great saphenous vein was also interrogated. Spectral Doppler was utilized to evaluate flow at rest and with distal augmentation maneuvers in the common femoral, femoral and popliteal veins. COMPARISON:  None. FINDINGS: Contralateral Common Femoral Vein: Respiratory phasicity is normal and symmetric with the symptomatic side. No evidence of thrombus. Normal compressibility. Common Femoral Vein: No evidence of thrombus. Normal compressibility, respiratory phasicity and response to augmentation. Saphenofemoral Junction: No evidence of thrombus. Normal compressibility and flow on color Doppler imaging. Profunda Femoral Vein: No evidence of thrombus. Normal compressibility and flow on color Doppler imaging. Femoral Vein: No evidence of thrombus. Normal compressibility, respiratory phasicity and response to augmentation. Popliteal Vein: No evidence of thrombus. Normal compressibility, respiratory phasicity and response to augmentation. Calf Veins: No evidence of thrombus. Normal compressibility and flow on color Doppler imaging. Superficial Great Saphenous Vein: No evidence of thrombus. Normal compressibility. Venous Reflux:  None. Other Findings:  None. IMPRESSION: No evidence of deep venous thrombosis. Electronically Signed   By: Fidela Salisbury M.D.   On: 06/24/2018 12:45   US Breast Ltd Uni Right Inc Axilla  Result Date: 06/28/2018 CLINICAL DATA:  52 year old female with a right breast palpable abnormality. Prior benign right breast biopsy 2014. EXAM: DIGITAL DIAGNOSTIC BILATERAL MAMMOGRAM WITH CAD AND TOMO  RIGHT BREAST ULTRASOUND COMPARISON:  Previous exam(s). ACR Breast Density Category b: There are scattered areas of fibroglandular density. FINDINGS: No suspicious masses or calcifications are identified in either breast. Spot compression CC tomograms were performed over the palpable area of concern in the inner right breast with no definite abnormality seen. Mammographic images were processed with CAD. Physical examination the inner right breast does not reveal any palpable masses. Targeted ultrasound of the right breast was performed. No suspicious masses or abnormality seen, only heterogeneous fibroglandular tissue identified. IMPRESSION: 1.  No findings of malignancy in either breast. 2. No abnormalities identified in the region of palpable concern in the inner right breast. RECOMMENDATION: 1. Recommend further  management of the right breast palpable abnormality be based on clinical assessment. 2.  Screening mammogram in one year.(Code:SM-B-01Y) I have discussed the findings and recommendations with the patient. Results were also provided in writing at the conclusion of the visit. If applicable, a reminder letter will be sent to the patient regarding the next appointment. BI-RADS CATEGORY  1: Negative. Electronically Signed   By: Everlean Alstrom M.D.   On: 06/28/2018 11:42   Mm Diag Breast Tomo Bilateral  Result Date: 06/28/2018 CLINICAL DATA:  52 year old female with a right breast palpable abnormality. Prior benign right breast biopsy 2014. EXAM: DIGITAL DIAGNOSTIC BILATERAL MAMMOGRAM WITH CAD AND TOMO RIGHT BREAST ULTRASOUND COMPARISON:  Previous exam(s). ACR Breast Density Category b: There are scattered areas of fibroglandular density. FINDINGS: No suspicious masses or calcifications are identified in either breast. Spot compression CC tomograms were performed over the palpable area of concern in the inner right breast with no definite abnormality seen. Mammographic images were processed with CAD.  Physical examination the inner right breast does not reveal any palpable masses. Targeted ultrasound of the right breast was performed. No suspicious masses or abnormality seen, only heterogeneous fibroglandular tissue identified. IMPRESSION: 1.  No findings of malignancy in either breast. 2. No abnormalities identified in the region of palpable concern in the inner right breast. RECOMMENDATION: 1. Recommend further management of the right breast palpable abnormality be based on clinical assessment. 2.  Screening mammogram in one year.(Code:SM-B-01Y) I have discussed the findings and recommendations with the patient. Results were also provided in writing at the conclusion of the visit. If applicable, a reminder letter will be sent to the patient regarding the next appointment. BI-RADS CATEGORY  1: Negative. Electronically Signed   By: Everlean Alstrom M.D.   On: 06/28/2018 11:42

## 2018-07-23 NOTE — ED Triage Notes (Signed)
Pt arrived from home, dropped off by husband, in respiratory distress; pt unable to speak a word upon ambulating into waiting room and taken straight to treatment room 15; Pt's primary nurse, Danise Mina and Dr Mable Paris notified of pt's arrival and met pt in the room;

## 2018-07-23 NOTE — Progress Notes (Signed)
Patient was on bipap for a short period for extreme sob. Placed patient on 12/5 50% and left room for transport of another patient to icu. When I returned patient was off bipap and on svn treatments per md.  bipap on standby.

## 2018-07-23 NOTE — ED Notes (Signed)
Bipap off by EDP

## 2018-07-23 NOTE — ED Provider Notes (Signed)
[facility] Emergency Department Provider Note  ____________________________________________   First MD Initiated Contact with Patient 07/23/18 [phone]     (approximate)  I have reviewed the triage vital signs and the nursing notes.   HISTORY  Chief Complaint Respiratory Distress  Most history is obtained after the patient turned around as on arrival she was critically ill and unable to provide any meaningful history  HPI Brittany Nguyen is a 52 y.o. female who comes to the emergency department with severe respiratory distress.  She has a past medical history of COPD as well as congestive heart failure and says that she has had a "cold" for the past 2 days or so.  Her symptoms have been slowly progressive however this morning became acutely severe and she was unable to catch her breath.  She denies fevers or chills.  She did use a breathing treatment at home with essentially no relief.  She recently was treated with Bactrim for a "respiratory infection".  She does not think the Bactrim helped.  She does have sharp/aching upper chest pain worse with cough and improved when not coughing.  Her primary concern today was the shortness of breath and the persistent cough.  Nothing seems to make the cough better.    Past Medical History:  Diagnosis Date  . Asthma 2013  . CHF (congestive heart failure) ([place])   . COPD (chronic obstructive pulmonary disease) ([place])   . Diverticulitis 2010  . DVT (deep venous thrombosis) ([place])   . Endometriosis 1990  . Heart disease 2013  . Hx MRSA infection   . Iron deficiency   . Lump or mass in breast   . Restless leg     Patient Active Problem List   Diagnosis Date Noted  . Iron deficiency anemia 03/27/2018  . Anemia 03/26/2018  . Abdominal pain 02/20/2018  . Unstable angina ([place])   . Encounter for anticoagulation discussion and counseling   . C. difficile colitis 01/14/2018  . GIB (gastrointestinal bleeding) 01/08/2018    . SOB (shortness of breath) 11/01/2017  . Influenza A 10/29/2017  . Acute respiratory failure ([place]) 09/24/2017  . Sepsis ([place]) 07/03/2017  . COPD (chronic obstructive pulmonary disease) ([place]) 04/26/2017  . Community acquired pneumonia 04/25/2017  . Chronic systolic heart failure ([place]) 02/03/2017  . Hypotension 02/03/2017  . Chest pain 12/09/2016  . RLS (restless legs syndrome) 12/09/2016  . Cough, persistent 11/22/2015  . Hypokalemia 11/22/2015  . Leukocytosis 11/22/2015  . Dyspnea 11/21/2015  . Respiratory distress 09/19/2015  . Breast pain 01/11/2013  . Hx MRSA infection   . Lump or mass in breast   . GOITER, MULTINODULAR 01/20/2009  . HYPOGLYCEMIA, UNSPECIFIED 01/20/2009  . GERD 01/20/2009  . DIVERTICULITIS OF COLON 01/20/2009    Past Surgical History:  Procedure Laterality Date  . ABDOMINAL HYSTERECTOMY     age 49  . BREAST BIOPSY Right 2014   benign  . CARDIAC DEFIBRILLATOR PLACEMENT    . CARDIAC DEFIBRILLATOR PLACEMENT    . CESAREAN SECTION    . CHOLECYSTECTOMY    . COLECTOMY    . COLONOSCOPY WITH PROPOFOL N/A 02/22/2018   Procedure: COLONOSCOPY WITH PROPOFOL;  Surgeon: Toledo, Benay Pike, MD;  Location: [facility] ENDOSCOPY;  Service: Gastroenterology;  Laterality: N/A;  . ESOPHAGOGASTRODUODENOSCOPY (EGD) WITH PROPOFOL N/A 02/22/2018   Procedure: ESOPHAGOGASTRODUODENOSCOPY (EGD) WITH PROPOFOL;  Surgeon: Toledo, Benay Pike, MD;  Location: [facility] ENDOSCOPY;  Service: Gastroenterology;  Laterality: N/A;  . NASAL SINUS SURGERY  2012  .  OOPHORECTOMY      Prior to Admission medications   Medication Sig Start Date End Date Taking? Authorizing Provider  albuterol (ACCUNEB) 1.25 MG/3ML nebulizer solution Take 3 mLs by nebulization every 6 (six) hours as needed for wheezing. 11/09/17   [provider]  azithromycin (ZITHROMAX Z-PAK) 250 MG tablet Take 2 tablets (500 mg) on  Day 1,  followed by 1 tablet (250 mg) once daily on Days 2 through 5. 07/23/18   Darel Hong, MD   beclomethasone (QVAR) 80 MCG/ACT inhaler Inhale 2 puffs into the lungs 2 (two) times daily. 06/05/17 06/05/18  Laverle Hobby, MD  benzonatate (TESSALON) 200 MG capsule Take 1 capsule (200 mg total) by mouth every 6 (six) hours as needed for cough. 07/23/18 07/23/19  Darel Hong, MD  butalbital-acetaminophen-caffeine (FIORICET, ESGIC) [phone] MG tablet Take 1-2 tablets by mouth every 6 (six) hours as needed for headache. 05/04/18 05/04/19  Harvest Dark, MD  cyanocobalamin 500 MCG tablet Take 500 mcg by mouth daily.    [provider]  ferrous sulfate 325 (65 FE) MG tablet Take 325 mg by mouth daily with breakfast.    [provider]  ibuprofen (ADVIL,MOTRIN) 800 MG tablet Take 1 tablet (800 mg total) by mouth every 8 (eight) hours as needed for moderate pain. 05/25/18   Paulette Blanch, MD  levothyroxine (SYNTHROID, LEVOTHROID) 150 MCG tablet Take 150 mcg by mouth daily before breakfast.    [provider]  losartan (COZAAR) 25 MG tablet Take 12.5 mg by mouth 2 (two) times daily.    [provider]  metoprolol succinate (TOPROL-XL) 100 MG 24 hr tablet Take 1 tablet (100 mg total) by mouth daily. Take with or immediately following a meal. 01/16/18   Loletha Grayer, MD  Multiple Vitamin (MULTIVITAMIN WITH MINERALS) TABS tablet Take 1 tablet by mouth daily.    [provider]  nitroGLYCERIN (NITROSTAT) 0.4 MG SL tablet Place 1 tablet (0.4 mg total) under the tongue every 5 (five) minutes as needed for chest pain. 01/24/17   Henreitta Leber, MD  potassium chloride SA (KLOR-CON M20) 20 MEQ tablet Take 1 tablet (20 mEq total) by mouth 2 (two) times daily for 7 days. 06/17/18 06/24/18  Hinda Kehr, MD  predniSONE (DELTASONE) 50 MG tablet Take 1 tablet (50 mg total) by mouth daily for 4 days. 07/23/18 07/27/18  Darel Hong, MD  promethazine (PHENERGAN) 25 MG tablet Take 1 tablet (25 mg total) by mouth every 6 (six) hours as needed for nausea or  vomiting. 05/25/18   Paulette Blanch, MD  rOPINIRole (REQUIP) 2 MG tablet Take 2 tablets (4 mg total) by mouth at bedtime. 2-4  Mg per night Patient taking differently: Take 4 mg by mouth at bedtime.  01/16/18   Loletha Grayer, MD  sertraline (ZOLOFT) 50 MG tablet Take 50 mg by mouth daily.    [provider]  spironolactone (ALDACTONE) 50 MG tablet Take 50 mg by mouth daily.    [provider]  traMADol (ULTRAM) 50 MG tablet Take 50 mg by mouth every 6 (six) hours as needed.    [provider]    Allergies Contrast media [iodinated diagnostic agents]; Metrizamide; Penicillins; Isosorbide nitrate; Latex; Ondansetron; Zofran [ondansetron hcl]; Povidone-iodine; and Pulmicort [budesonide]  Family History  Problem Relation Age of Onset  . Cancer Mother 30       ovarian  . CAD Mother   . Cancer Father 42       brain  . CAD  Father   . Cancer Daughter 18       skin  . Cancer Maternal Aunt 35       breast  . Leukemia Paternal Grandfather     Social History Social History   Tobacco Use  . Smoking status: Never Smoker  . Smokeless tobacco: Never Used  Substance Use Topics  . Alcohol use: No  . Drug use: No    Review of Systems Constitutional: No fever/chills Eyes: No visual changes. ENT: No sore throat. Cardiovascular: Positive for chest pain. Respiratory: Positive for shortness of breath. Gastrointestinal: No abdominal pain.  No nausea, no vomiting.  No diarrhea.  No constipation. Genitourinary: Negative for dysuria. Musculoskeletal: Negative for back pain. Skin: Negative for rash. Neurological: Negative for headaches, focal weakness or numbness.   ____________________________________________   PHYSICAL EXAM:  VITAL SIGNS: ED Triage Vitals  Enc Vitals Group     BP      Pulse      Resp      Temp      Temp src      SpO2      Weight      Height      Head Circumference      Peak Flow      Pain Score      Pain Loc      Pain Edu?       Excl. in [place]?     Constitutional: Appears critically ill in severe respiratory distress gasping for air using accessory muscles and only speaking in 1 word gasps Eyes: PERRL EOMI. Head: Atraumatic. Nose: No congestion/rhinnorhea. Mouth/Throat: No trismus Neck: No stridor.   Cardiovascular: Tachycardic rate, regular rhythm. Grossly normal heart sounds.  Good peripheral circulation. Respiratory: Severe respiratory distress using accessory muscles with prolonged expiratory phase and very tight lungs moving very little air Gastrointestinal: Obese soft nontender Musculoskeletal: No lower extremity edema legs are equal in size Neurologic:  No gross focal neurologic deficits are appreciated. Skin: Diaphoretic Psychiatric: Anxious appearing    ____________________________________________   DIFFERENTIAL includes but not limited to  Acute pulmonary edema, COPD exacerbation, pneumonia, pulmonary embolism, pneumothorax ____________________________________________   LABS (all labs ordered are listed, but only abnormal results are displayed)  Labs Reviewed  COMPREHENSIVE METABOLIC PANEL - Abnormal; Notable for the following components:      Result Value   Potassium 3.1 (*)    Glucose, Bld 132 (*)    Creatinine, Ser 1.15 (*)    GFR calc non Af Amer 55 (*)    All other components within normal limits  CBC WITH DIFFERENTIAL/PLATELET - Abnormal; Notable for the following components:   WBC 12.0 (*)    Neutro Abs 7.8 (*)    Abs Immature Granulocytes 0.15 (*)    All other components within normal limits  BLOOD GAS, VENOUS - Abnormal; Notable for the following components:   pH, Ven 7.45 (*)    Bicarbonate 33.4 (*)    Acid-Base Excess 8.0 (*)    All other components within normal limits  BRAIN NATRIURETIC PEPTIDE  TROPONIN I  INFLUENZA PANEL BY PCR (TYPE A & B)    Lab work reviewed by me shows slightly elevated white count which is nonspecific and likely secondary to pain and stress.  Low  potassium likely secondary to rhonchi dilators and pain __________________________________________  EKG  ED ECG REPORT I, Darel Hong, the attending physician, personally viewed and interpreted this ECG.  Date: 07/23/2018 EKG Time:  Rate: 135 Rhythm: Sinus tachycardia QRS  Axis: Leftward axis Intervals: normal ST/T Wave abnormalities: normal Narrative Interpretation: no evidence of acute ischemia  ____________________________________________  RADIOLOGY  Chest x-ray reviewed by me with no acute disease ____________________________________________   PROCEDURES  Procedure(s) performed: no  .Critical Care Performed by: Darel Hong, MD Authorized by: Darel Hong, MD   Critical care provider statement:    Critical care time (minutes):  30   Critical care time was exclusive of:  Separately billable procedures and treating other patients   Critical care was necessary to treat or prevent imminent or life-threatening deterioration of the following conditions:  Respiratory failure   Critical care was time spent personally by me on the following activities:  Development of treatment plan with patient or surrogate, discussions with consultants, evaluation of patient's response to treatment, examination of patient, obtaining history from patient or surrogate, ordering and performing treatments and interventions, ordering and review of laboratory studies, ordering and review of radiographic studies, pulse oximetry, re-evaluation of patient's condition and review of old charts    Critical Care performed: Yes  ____________________________________________   INITIAL IMPRESSION / ASSESSMENT AND PLAN / ED COURSE  Pertinent labs & imaging results that were available during my care of the patient were reviewed by me and considered in my medical decision making (see chart for details).   As part of my medical decision making, I reviewed the following data within the [place] History obtained from family if available, nursing notes, old chart and ekg, as well as notes from prior ED visits.  The patient comes to the emergency department extremely ill appearing tachycardic in severe respiratory distress gasping for air speaking in one-word sentences with extremely tight lungs.  We initially gave her 3 duo nebs, Solu-Medrol, IV fluids, and bolused 2 g of magnesium.  As we were not making much progress we placed her on BiPAP with near immediate improvement in her symptoms.  I observed her for around 2 hours and her symptoms rapidly improved.  I subsequently gave her a trial off of BiPAP and she was saturating 100% on room air although did remain slightly wheezy.  Her primary concern at that point was her cough.  I then nebulized her 5 more milligrams of albuterol mixed in with 5 cc of 1% lidocaine and her symptoms nearly completely resolved.  We discussed that Bactrim is not an appropriate antibiotic for respiratory coverage and as she seems to have failed her liquid cough medicine I will prescribe her a azithromycin as well as Tessalon Perles instead.  She is influenza negative and her lab work is reassuring.  I appreciate her history of heart failure however she was clearly dry from insensible losses and feels improved after a liter of fluids as well.  At this point the patient is able to ambulate, saturating 100% on room air, eating and drinking, and would like to go home.  I am sending her home with 4 more days of steroids, azithromycin, Tessalon Perles.  Strict return precautions have been given.      ____________________________________________   FINAL CLINICAL IMPRESSION(S) / ED DIAGNOSES  Final diagnoses:  COPD exacerbation ([place])  Bronchitis      NEW MEDICATIONS STARTED DURING THIS VISIT:  Discharge Medication List as of 07/23/2018  6:25 AM    START taking these medications   Details  azithromycin (ZITHROMAX Z-PAK) 250 MG tablet Take 2  tablets (500 mg) on  Day 1,  followed by 1 tablet (250 mg) once daily on Days 2  through 5., Print    benzonatate (TESSALON) 200 MG capsule Take 1 capsule (200 mg total) by mouth every 6 (six) hours as needed for cough., Starting Mon 07/23/2018, Until Tue 07/23/2019, Print    predniSONE (DELTASONE) 50 MG tablet Take 1 tablet (50 mg total) by mouth daily for 4 days., Starting Mon 07/23/2018, Until Fri 07/27/2018, Print         Note:  This document was prepared using Dragon voice recognition software and may include unintentional dictation errors.     Darel Hong, MD 07/23/18 [phone]

## 2018-07-23 NOTE — ED Notes (Signed)
RT called to return for bipap

## 2018-07-23 NOTE — ED Notes (Signed)
RT called

## 2018-07-25 LAB — BLOOD GAS, VENOUS
ACID-BASE EXCESS: 8 mmol/L — AB (ref 0.0–2.0)
BICARBONATE: 33.4 mmol/L — AB (ref 20.0–28.0)
PH VEN: 7.45 — AB (ref 7.250–7.430)
Patient temperature: 37
pCO2, Ven: 48 mmHg (ref 44.0–60.0)

## 2018-08-08 DIAGNOSIS — N63 Unspecified lump in unspecified breast: Secondary | ICD-10-CM | POA: Insufficient documentation

## 2018-08-24 DIAGNOSIS — I2510 Atherosclerotic heart disease of native coronary artery without angina pectoris: Secondary | ICD-10-CM | POA: Insufficient documentation

## 2018-08-24 DIAGNOSIS — J4520 Mild intermittent asthma, uncomplicated: Secondary | ICD-10-CM | POA: Insufficient documentation

## 2018-08-27 ENCOUNTER — Encounter: Payer: Self-pay | Admitting: Oncology

## 2018-08-27 ENCOUNTER — Inpatient Hospital Stay: Payer: Medicaid Other | Attending: Family Medicine
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
## 2018-09-18 IMAGING — CR DG CHEST 2V
2 series · 2 of 2 positions shown · non-contrast
Comparison: Radiograph 06/18/2017

CLINICAL DATA: Fever and body aches.

EXAM:
CHEST  2 VIEW

[chest pa]
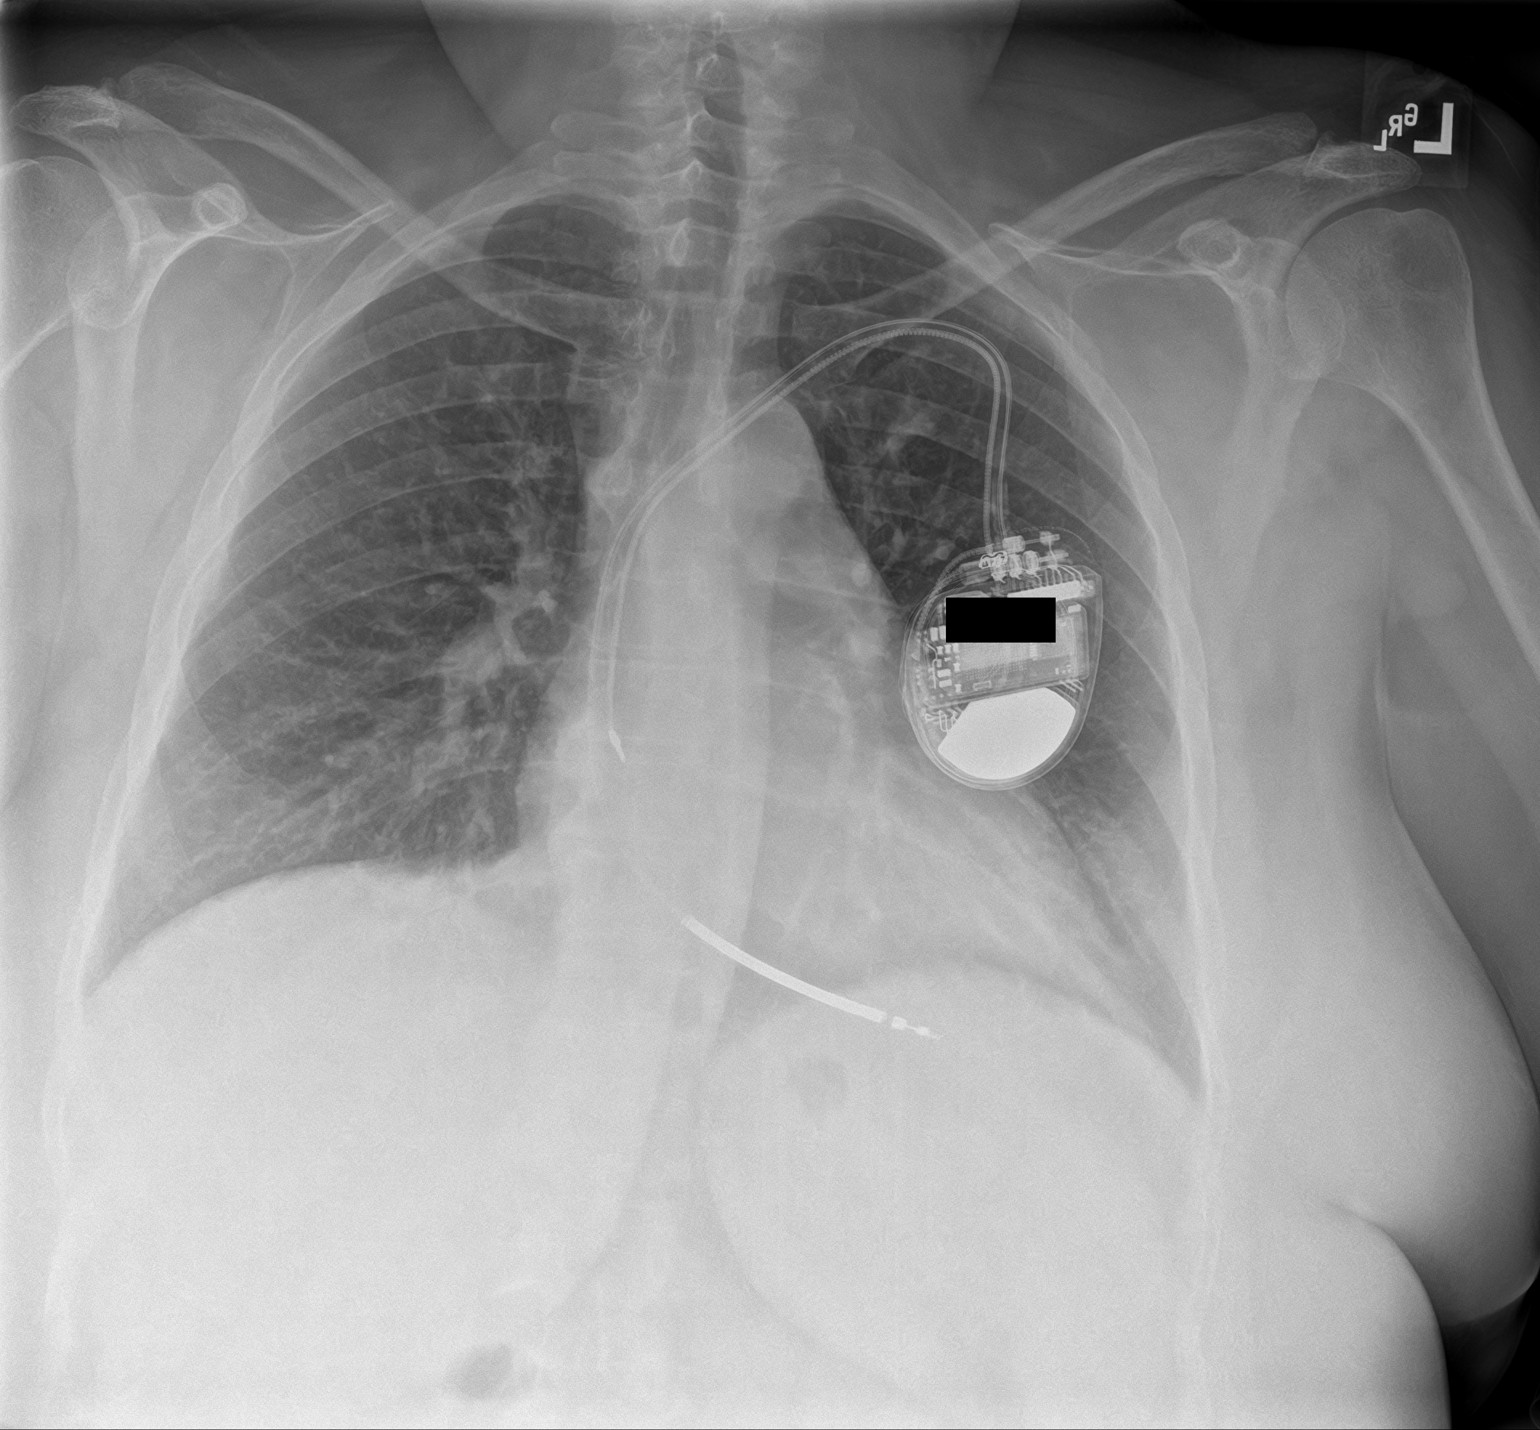

[chest lat]
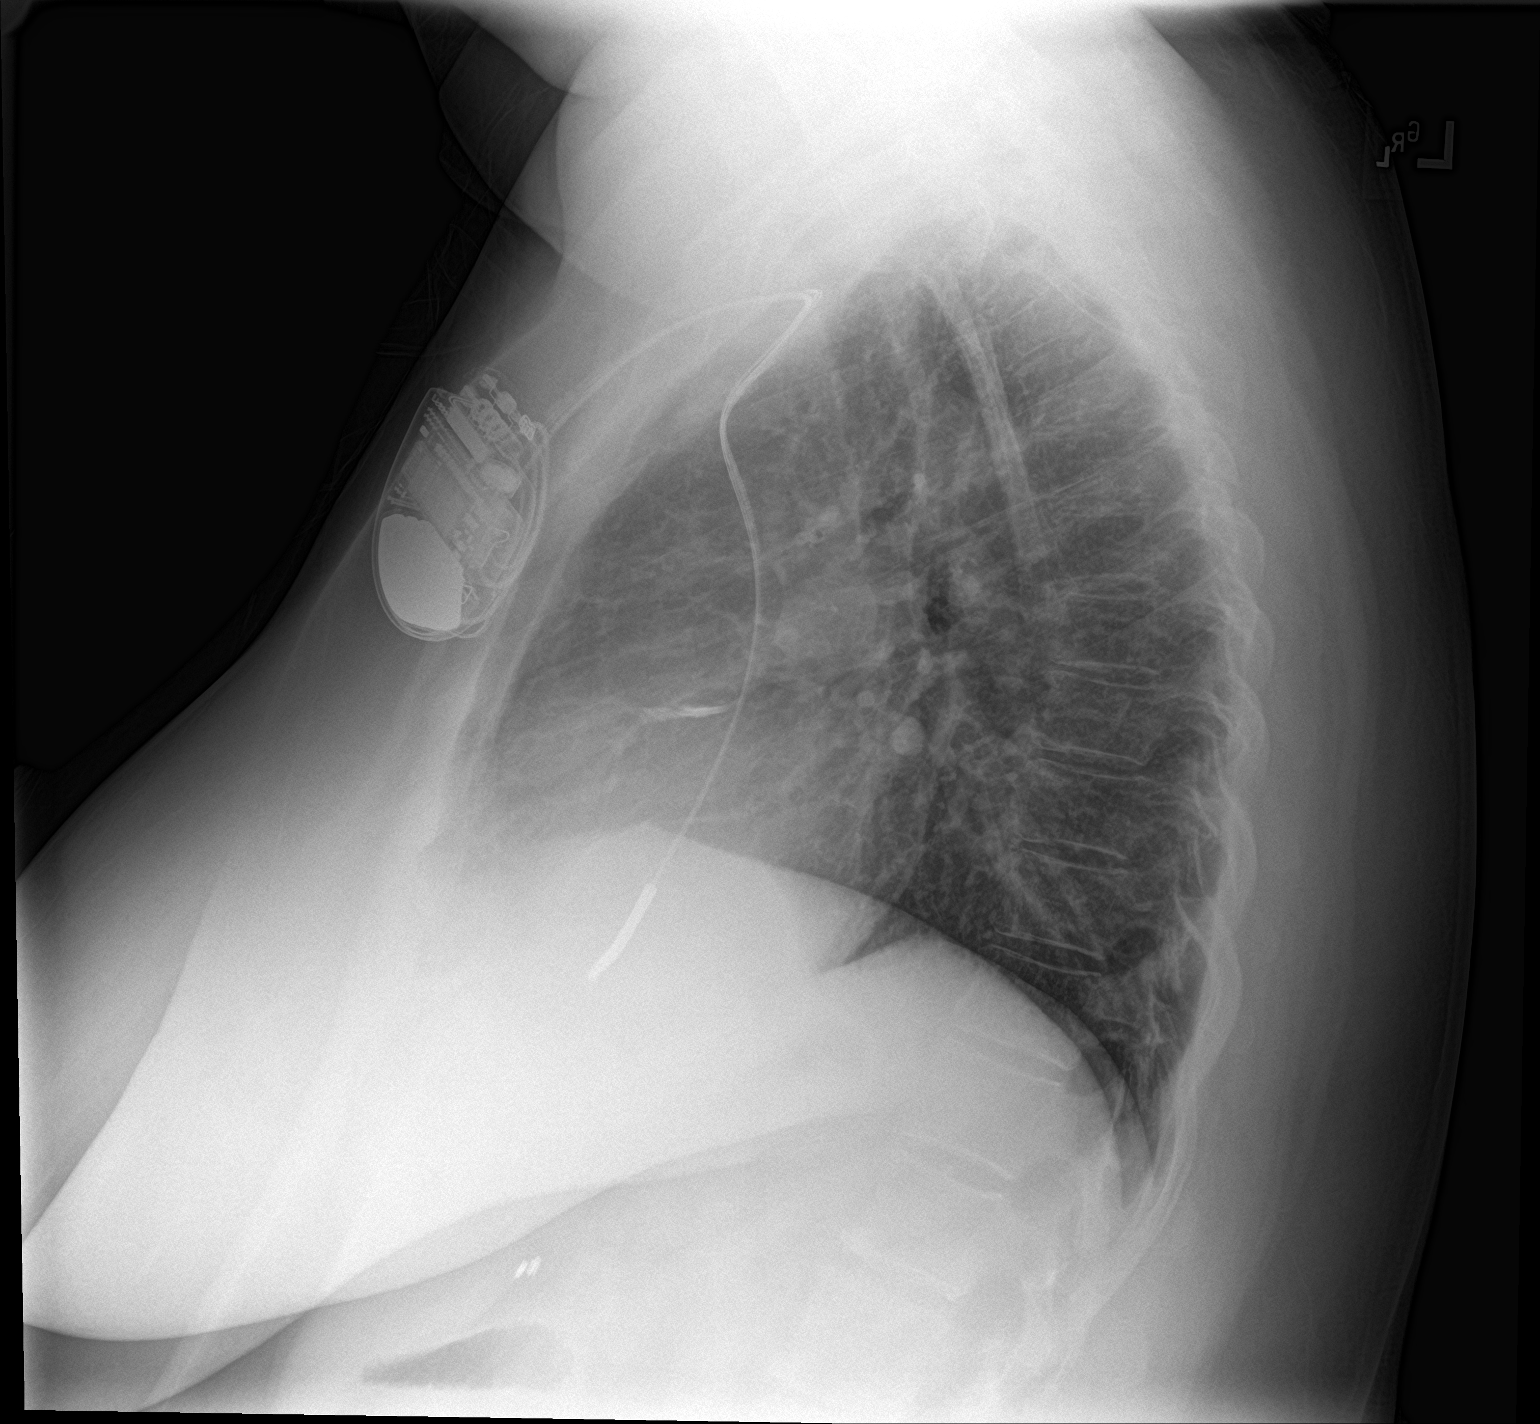

[2 of 2 positions shown; findings below may reference images not displayed]

FINDINGS: Left-sided pacemaker in place, unchanged in position. The
cardiomediastinal contours are normal. The lungs are clear.
Pulmonary vasculature is normal. No consolidation, pleural effusion,
or pneumothorax. No acute osseous abnormalities are seen.
IMPRESSION: No acute pulmonary process.

## 2018-09-23 ENCOUNTER — Encounter: Payer: Self-pay | Admitting: Medical Oncology

## 2018-09-23 ENCOUNTER — Emergency Department
Admission: EM | Admit: 2018-09-23 | Discharge: 2018-09-23 | Disposition: A | Discharge status: Home / Self Care | Payer: Medicaid Other | Attending: Emergency Medicine | Admitting: Emergency Medicine

## 2018-09-23 ENCOUNTER — Emergency Department: Payer: Medicaid Other

## 2018-09-23 DIAGNOSIS — J449 Chronic obstructive pulmonary disease, unspecified: Secondary | ICD-10-CM | POA: Diagnosis not present

## 2018-09-23 DIAGNOSIS — I5022 Chronic systolic (congestive) heart failure: Secondary | ICD-10-CM | POA: Diagnosis not present

## 2018-09-23 DIAGNOSIS — Z79899 Other long term (current) drug therapy: Secondary | ICD-10-CM | POA: Insufficient documentation

## 2018-09-23 DIAGNOSIS — Z91040 Latex allergy status: Secondary | ICD-10-CM | POA: Diagnosis not present

## 2018-09-23 DIAGNOSIS — R1010 Upper abdominal pain, unspecified: Secondary | ICD-10-CM | POA: Insufficient documentation

## 2018-09-23 LAB — CBC WITH DIFFERENTIAL/PLATELET
ABS IMMATURE GRANULOCYTES: 0.02 10*3/uL (ref 0.00–0.07)
Basophils Absolute: 0 10*3/uL (ref 0.0–0.1)
Basophils Relative: 1 %
Eosinophils Absolute: 0.1 10*3/uL (ref 0.0–0.5)
Eosinophils Relative: 1 %
HCT: 41.2 % (ref 36.0–46.0)
Hemoglobin: 14 g/dL (ref 12.0–15.0)
Immature Granulocytes: 0 %
Lymphocytes Relative: 26 %
Lymphs Abs: 1.7 10*3/uL (ref 0.7–4.0)
MCH: 30.8 pg (ref 26.0–34.0)
MCHC: 34 g/dL (ref 30.0–36.0)
MCV: 90.7 fL (ref 80.0–100.0)
Monocytes Absolute: 0.5 10*3/uL (ref 0.1–1.0)
Monocytes Relative: 7 %
NEUTROS ABS: 4.1 10*3/uL (ref 1.7–7.7)
Neutrophils Relative %: 65 %
Platelets: 221 10*3/uL (ref 150–400)
RBC: 4.54 MIL/uL (ref 3.87–5.11)
RDW: 12.8 % (ref 11.5–15.5)
WBC: 6.4 10*3/uL (ref 4.0–10.5)
nRBC: 0 % (ref 0.0–0.2)

## 2018-09-23 LAB — TYPE AND SCREEN
ABO/RH(D): O NEG
Antibody Screen: NEGATIVE

## 2018-09-23 LAB — COMPREHENSIVE METABOLIC PANEL
ALT: 27 U/L (ref 0–44)
AST: 24 U/L (ref 15–41)
Albumin: 4.2 g/dL (ref 3.5–5.0)
Alkaline Phosphatase: 96 U/L (ref 38–126)
Anion gap: 8 (ref 5–15)
BUN: 16 mg/dL (ref 6–20)
CO2: 32 mmol/L (ref 22–32)
CREATININE: 0.95 mg/dL (ref 0.44–1.00)
Calcium: 8.6 mg/dL — ABNORMAL LOW (ref 8.9–10.3)
Chloride: 98 mmol/L (ref 98–111)
GFR calc Af Amer: >60 mL/min (ref >60)
GFR calc non Af Amer: >60 mL/min (ref >60)
Glucose, Bld: 114 mg/dL — ABNORMAL HIGH (ref 70–99)
Potassium: 3.3 mmol/L — ABNORMAL LOW (ref 3.5–5.1)
Sodium: 138 mmol/L (ref 135–145)
Total Bilirubin: 0.7 mg/dL (ref 0.3–1.2)
Total Protein: 7.7 g/dL (ref 6.5–8.1)

## 2018-09-23 LAB — LIPASE, BLOOD: Lipase: 27 U/L (ref 11–51)

## 2018-09-23 LAB — TROPONIN I: Troponin I: <0.03 ng/mL (ref <0.03)

## 2018-09-23 MED ORDER — SODIUM CHLORIDE 0.9 % IV BOLUS
1000.0000 mL | Freq: Once | INTRAVENOUS | Status: AC
  Administered 2018-09-23: 1000 mL via INTRAVENOUS

## 2018-09-23 MED ORDER — OXYCODONE-ACETAMINOPHEN 5-325 MG PO TABS: 1.0000 | ORAL_TABLET | ORAL | 0 refills | Status: DC | PRN

## 2018-09-23 MED ORDER — FENTANYL CITRATE (PF) 100 MCG/2ML IJ SOLN
50.0000 ug | Freq: Once | INTRAMUSCULAR | Status: AC
  Administered 2018-09-23: 50 ug via INTRAVENOUS
  Filled 2018-09-23: qty 2

## 2018-09-23 NOTE — ED Provider Notes (Signed)
[facility] Emergency Department Provider Note  ____________________________________________   First MD Initiated Contact with Patient 09/23/18 1030     (approximate)  I have reviewed the triage vital signs and the nursing notes.   HISTORY  Chief Complaint Abdominal Pain and Rectal Bleeding   HPI Brittany Nguyen is a 53 y.o. female with a history of CHF, COPD as well as DVT with recently diagnosed gastric cancer was presented emergency department with worsening upper abdominal pain as well as blood in her stool.  Patient states that she has had blood in her stool over the past several months as well as increasing abdominal pain.  Denies any vomiting blood.  Says that the blood in her stool appears to be mixed in with her stool.  Says the pain is a 10 out of 10, cramping and sharp upper abdominal pain.  She is seen by the cancer center as well as GI here at [facility].  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  Taking tramadol at home without relief.  Says that she is also received iron infusions in the past.   Past Medical History:  Diagnosis Date  . Asthma 2013  . CHF (congestive heart failure) ([place])   . COPD (chronic obstructive pulmonary disease) ([place])   . Diverticulitis 2010  . DVT (deep venous thrombosis) ([place])   . Endometriosis 1990  . Heart disease 2013  . Hx MRSA infection   . Iron deficiency   . Lump or mass in breast   . Restless leg     Patient Active Problem List   Diagnosis Date Noted  . Iron deficiency anemia 03/27/2018  . Anemia 03/26/2018  . Abdominal pain 02/20/2018  . Unstable angina ([place])   . Encounter for anticoagulation discussion and counseling   . C. difficile colitis 01/14/2018  . GIB (gastrointestinal bleeding) 01/08/2018  . SOB (shortness of breath) 11/01/2017  . Influenza A 10/29/2017  . Acute respiratory failure ([place]) 09/24/2017  . Sepsis ([place]) 07/03/2017  .  COPD (chronic obstructive pulmonary disease) ([place]) 04/26/2017  . Community acquired pneumonia 04/25/2017  . Chronic systolic heart failure ([place]) 02/03/2017  . Hypotension 02/03/2017  . Chest pain 12/09/2016  . RLS (restless legs syndrome) 12/09/2016  . Cough, persistent 11/22/2015  . Hypokalemia 11/22/2015  . Leukocytosis 11/22/2015  . Dyspnea 11/21/2015  . Respiratory distress 09/19/2015  . Breast pain 01/11/2013  . Hx MRSA infection   . Lump or mass in breast   . GOITER, MULTINODULAR 01/20/2009  . HYPOGLYCEMIA, UNSPECIFIED 01/20/2009  . GERD 01/20/2009  . DIVERTICULITIS OF COLON 01/20/2009    Past Surgical History:  Procedure Laterality Date  . ABDOMINAL HYSTERECTOMY     age 61  . BREAST BIOPSY Right 2014   benign  . CARDIAC DEFIBRILLATOR PLACEMENT    . CARDIAC DEFIBRILLATOR PLACEMENT    . CESAREAN SECTION    . CHOLECYSTECTOMY    . COLECTOMY    . COLONOSCOPY WITH PROPOFOL N/A 02/22/2018   Procedure: COLONOSCOPY WITH PROPOFOL;  Surgeon: Toledo, Benay Pike, MD;  Location: [facility] ENDOSCOPY;  Service: Gastroenterology;  Laterality: N/A;  . ESOPHAGOGASTRODUODENOSCOPY (EGD) WITH PROPOFOL N/A 02/22/2018   Procedure: ESOPHAGOGASTRODUODENOSCOPY (EGD) WITH PROPOFOL;  Surgeon: Toledo, Benay Pike, MD;  Location: [facility] ENDOSCOPY;  Service: Gastroenterology;  Laterality: N/A;  . NASAL SINUS SURGERY  2012  . OOPHORECTOMY      Prior to Admission medications   Medication Sig Start Date End Date Taking? Authorizing Provider  albuterol (ACCUNEB) 1.25 MG/3ML nebulizer solution Take 3 mLs by nebulization every 6 (six) hours as needed for wheezing. 11/09/17  Yes [provider]  baclofen (LIORESAL) 10 MG tablet Take 10 mg by mouth 2 (two) times daily. 06/11/18  Yes [provider]  beclomethasone (QVAR) 80 MCG/ACT inhaler Inhale 2 puffs into the lungs 2 (two) times daily. 06/05/17 09/23/18 Yes Laverle Hobby, MD  busPIRone (BUSPAR) 15 MG tablet Take 15 mg by mouth 2 (two) times  daily. 05/08/18  Yes [provider]  cyanocobalamin 500 MCG tablet Take 500 mcg by mouth daily.   Yes [provider]  furosemide (LASIX) 40 MG tablet Take 40 mg by mouth 2 (two) times daily. 07/09/18  Yes [provider]  levothyroxine (SYNTHROID, LEVOTHROID) 150 MCG tablet Take 150 mcg by mouth daily before breakfast.   Yes [provider]  losartan (COZAAR) 25 MG tablet Take 25 mg by mouth daily.    Yes [provider]  meloxicam (MOBIC) 15 MG tablet Take 15 mg by mouth daily. 08/30/18  Yes [provider]  Multiple Vitamin (MULTIVITAMIN WITH MINERALS) TABS tablet Take 1 tablet by mouth daily.   Yes [provider]  nitroGLYCERIN (NITROSTAT) 0.4 MG SL tablet Place 1 tablet (0.4 mg total) under the tongue every 5 (five) minutes as needed for chest pain. 01/24/17  Yes Sainani, Belia Heman, MD  potassium chloride SA (KLOR-CON M20) 20 MEQ tablet Take 1 tablet (20 mEq total) by mouth 2 (two) times daily for 7 days. Patient taking differently: Take 40 mEq by mouth daily as needed.  06/17/18 09/23/18 Yes Hinda Kehr, MD  rOPINIRole (REQUIP) 2 MG tablet Take 2 tablets (4 mg total) by mouth at bedtime. 2-4  Mg per night Patient taking differently: Take 4 mg by mouth at bedtime.  01/16/18  Yes Wieting, Richard, MD  sertraline (ZOLOFT) 50 MG tablet Take 50 mg by mouth daily.   Yes [provider]  spironolactone (ALDACTONE) 25 MG tablet Take 25 mg by mouth 2 (two) times daily.    Yes [provider]  traMADol (ULTRAM) 50 MG tablet Take 50 mg by mouth every 6 (six) hours as needed.   Yes [provider]  Venlafaxine HCl 37.5 MG TB24 Take 37.5 mg by mouth daily. 08/15/18  Yes [provider]    Allergies Contrast media [iodinated diagnostic agents]; Metrizamide; Penicillins; Isosorbide nitrate; Latex; Ondansetron; Zofran [ondansetron hcl]; Povidone-iodine; and Pulmicort [budesonide]  Family History  Problem  Relation Age of Onset  . Cancer Mother 30       ovarian  . CAD Mother   . Cancer Father 28       brain  . CAD Father   . Cancer Daughter 18       skin  . Cancer Maternal Aunt 94       breast  . Leukemia Paternal Grandfather     Social History Social History   Tobacco Use  . Smoking status: Never Smoker  . Smokeless tobacco: Never Used  Substance Use Topics  . Alcohol use: No  . Drug use: No    Review of Systems  Constitutional: No fever/chills Eyes: No visual changes. ENT: No sore throat. Cardiovascular: Denies chest pain. Respiratory: Denies shortness of breath. Gastrointestinal: As above Genitourinary: Negative for dysuria. Musculoskeletal: Negative for back pain. Skin: Negative for rash. Neurological: Negative for headaches, focal weakness or numbness.   ____________________________________________   PHYSICAL EXAM:  VITAL SIGNS: ED Triage Vitals  Enc Vitals  Group     BP 09/23/18 1028 109/78     Pulse Rate 09/23/18 1028 (!) 106     Resp 09/23/18 1028 20     Temp 09/23/18 1028 98.2 F (36.8 C)     Temp Source 09/23/18 1028 Oral     SpO2 09/23/18 1028 97 %     Weight 09/23/18 1022 218 lb 4.1 oz (99 kg)     Height --      Head Circumference --      Peak Flow --      Pain Score 09/23/18 1022 10     Pain Loc --      Pain Edu? --      Excl. in [place]? --     Constitutional: Alert and oriented.  Patient appears uncomfortable, lying on her left side and holding her abdomen. Eyes: Conjunctivae are normal.  Head: Atraumatic. Nose: No congestion/rhinnorhea. Mouth/Throat: Mucous membranes are moist.  Neck: No stridor.   Cardiovascular: Normal rate, regular rhythm. Grossly normal heart sounds. Respiratory: Normal respiratory effort.  No retractions. Lungs CTAB. Gastrointestinal: Soft with moderate tenderness to palpation across the upper abdomen which is slightly more pronounced to the epigastrium as well as left upper quadrant. No distention. No CVA  tenderness. Digital rectal exam with brown stool which is mildly heme positive.  Musculoskeletal: No lower extremity tenderness nor edema.  No joint effusions. Neurologic:  Normal speech and language. No gross focal neurologic deficits are appreciated. Skin:  Skin is warm, dry and intact. No rash noted. Psychiatric: Mood and affect are normal. Speech and behavior are normal.  ____________________________________________   LABS (all labs ordered are listed, but only abnormal results are displayed)  Labs Reviewed  COMPREHENSIVE METABOLIC PANEL - Abnormal; Notable for the following components:      Result Value   Potassium 3.3 (*)    Glucose, Bld 114 (*)    Calcium 8.6 (*)    All other components within normal limits  CBC WITH DIFFERENTIAL/PLATELET  LIPASE, BLOOD  URINALYSIS, COMPLETE (UACMP) WITH MICROSCOPIC  TROPONIN I  TYPE AND SCREEN   ____________________________________________  EKG  ED ECG REPORT I, Doran Stabler, the attending physician, personally viewed and interpreted this ECG.   Date: 09/23/2018  EKG Time: 1304  Rate: 78  Rhythm: normal sinus rhythm  Axis: Normal  Intervals:left anterior fascicular block  ST&T Change: No ST segment elevation or depression.  T wave inversions in 2, 3 and aVF as well as V2 through V6. Similar appearance to previous EKG of 06/25/2018. ____________________________________________  RADIOLOGY  No acute abnormality on the CT of the abdomen and pelvis.  Stable appearance of low-density gastric lesion.  No acute inflammatory changes. ____________________________________________   PROCEDURES  Procedure(s) performed:   Procedures  Critical Care performed:   ____________________________________________   INITIAL IMPRESSION / ASSESSMENT AND PLAN / ED COURSE  Pertinent labs & imaging results that were available during my care of the patient were reviewed by me and considered in my medical decision making (see chart for  details).  Differential diagnosis includes, but is not limited to, biliary disease (biliary colic, acute cholecystitis, cholangitis, choledocholithiasis, etc), intrathoracic causes for epigastric abdominal pain including ACS, gastritis, duodenitis, pancreatitis, small bowel or large bowel obstruction, abdominal aortic aneurysm, hernia, and ulcer(s). As part of my medical decision making, I reviewed the following data within the electronic MEDICAL RECORD NUMBER Notes from prior ED visits  ----------------------------------------- 12:35 PM on 09/23/2018 -----------------------------------------  Patient at this time says that her  pain has not been decreased after the fentanyl.  Blood pressure of 99/69 at this time.  Also discussed case Dr. Grayland Ormond of the cancer center who says that there is no acute action that needs to be undertaken from the oncology standpoint.  I will continue to try to control the patient's pain.  However, she may need to be admitted for pain control.  Likely pain secondary to her cancer.    ----------------------------------------- 2:03 PM on 09/23/2018 -----------------------------------------  Patient at this time says that the fentanyl is helping.  She has not given a urine sample despite trying.  However, denies any burning or frequency with urination.  Patient's pain is worsening over the past several months.  Discussed the option of discharging home versus admission for pain control.  The patient says that she would rather be discharged home.  She will be given Percocet and will follow up with her oncologist, Dr. Janese Banks. ____________________________________________   FINAL CLINICAL IMPRESSION(S) / ED DIAGNOSES  Upper abdominal pain.  NEW MEDICATIONS STARTED DURING THIS VISIT:  New Prescriptions   No medications on file     Note:  This document was prepared using Dragon voice recognition software and may include unintentional dictation errors.     Orbie Pyo, MD 09/23/18 [phone]

## 2018-09-23 NOTE — ED Triage Notes (Signed)
Pt reports she began having abd pain 3 days ago with rectal bleeding. Pt has recent diagnosis of gastric CA.

## 2018-09-24 ENCOUNTER — Inpatient Hospital Stay: Payer: Medicaid Other

## 2018-09-24 ENCOUNTER — Inpatient Hospital Stay: Payer: Medicaid Other | Attending: Family Medicine | Admitting: Oncology

## 2018-09-24 ENCOUNTER — Encounter: Payer: Self-pay | Admitting: Oncology

## 2018-09-24 VITALS — BP 140/84 | HR 109 | Temp 98.0°F | Resp 18 | Wt 232.0 lb

## 2018-09-24 DIAGNOSIS — E164 Increased secretion of gastrin: Secondary | ICD-10-CM

## 2018-09-24 DIAGNOSIS — Z79899 Other long term (current) drug therapy: Secondary | ICD-10-CM

## 2018-09-24 DIAGNOSIS — Z90710 Acquired absence of both cervix and uterus: Secondary | ICD-10-CM

## 2018-09-24 DIAGNOSIS — C7A092 Malignant carcinoid tumor of the stomach: Secondary | ICD-10-CM

## 2018-09-24 DIAGNOSIS — D509 Iron deficiency anemia, unspecified: Secondary | ICD-10-CM

## 2018-09-24 LAB — VITAMIN B12: Vitamin B-12: 430 pg/mL (ref 180–914)

## 2018-09-24 LAB — IRON AND TIBC
IRON: 66 ug/dL (ref 28–170)
Saturation Ratios: 22 % (ref 10.4–31.8)
TIBC: 305 ug/dL (ref 250–450)
UIBC: 239 ug/dL

## 2018-09-24 LAB — FERRITIN: Ferritin: 107 ng/mL (ref 11–307)

## 2018-09-24 NOTE — Progress Notes (Signed)
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 Having Increase in abd pain. Having more rectal bleeding, possible hemorrhoid.Went to ED over weekend. Called her GI doctor, has not gotten appt yet w/ Dr Alice Reichert.atelectasis breakfast this am. No vomiting today. Had 3 BM's today loose, runny w/ blood mixed in. Taking oxycodone very sparingly for her abd cramping.

## 2018-09-25 LAB — GASTRIN: Gastrin: 1720 pg/mL — ABNORMAL HIGH (ref 0–115)

## 2018-09-25 NOTE — Progress Notes (Signed)
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 Patient states that she has had chronic diarrhea since her gallbladder was taken out few years ago.Patient underwent EGD on 02/22/2018 which showed normal esophagus. Mildly erythematous mucosa without bleeding in the gastric antrum. Small submucosal noncircumferential mass with no bleeding and no recent stigmata of bleeding in the gastric body. Examined portion of duodenum was normal. Colonoscopy showed diverticulosis in the sigmoid colon. End-to-end colocolonic anastomosis with healthy-appearing mucosa. Internal hemorrhoids.  Patient was also referred to [facility] and underwent EUS of the gastric submucosal mass.  EGD pathology showed reactive duodenitis negative for dysplasia and malignancy in the duodenum. Evidence of reactive gastritis in the antral mucosa with moderate chronic gastritis and intestinal metaplasia. Negative for H. pylori dysplasia or malignancy. 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 Colonic mucosa was unremarkable without any evidence of dysplasia and malignancy.  EUS pathology showed antral type gastric mucosa with mild to moderate inactive chronic gastritis, neuroendocrine dysplasia and nodular  neuroendocrine hyperplasia. Negative for lipoma negative for granulomas.  Patient also had a gastrin level checked on 02/28/2018 which was elevated at 1698 and hence patient has been referred to [place] for the same. 24-hour 5-HIAA level was normal.  Results of anemia work up revealed iron deficiency. She could not tolerate feraheme and was witched to venofer  Patient also underwent small bowel capsule endoscopy in August 2019 which did not reveal any ulcerations or bleeding.  EUS on 08/24/2018 showed a medium submucosal mass 2.5 cm x 13 mm which was biopsied.  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  Focally active chronic gastritis and intestinal metaplasia Ki-67 less than 1% in the limited sample H. pylori negative.  Another area of random stomach biopsy also showed well-differentiated neuroendocrine tumor 0.1 cm  Interval history-patient has been having ongoing recurrent episodes of abdominal pain and sometimes it is so severe that she has to go to the ER.  She was recently discharged on Percocet.  ECOG PS- 1 Pain scale- 7- chronic abdominal pain   Review of systems- Review of Systems  Constitutional: Positive for malaise/fatigue. Negative for chills, fever and weight loss.  HENT: Negative for congestion, ear discharge and nosebleeds.   Eyes: Negative for blurred vision.  Respiratory: Negative for cough, hemoptysis, sputum production, shortness of breath and wheezing.   Cardiovascular: Negative for chest pain, palpitations, orthopnea and claudication.  Gastrointestinal: Positive for abdominal pain. Negative for blood in stool, constipation, diarrhea, heartburn, melena, nausea and vomiting.  Genitourinary: Negative for dysuria, flank pain, frequency, hematuria and urgency.  Musculoskeletal: Negative for back pain, joint pain and myalgias.  Skin: Negative for rash.  Neurological: Negative for  dizziness, tingling, focal weakness, seizures, weakness and headaches.  Endo/Heme/Allergies: Does not bruise/bleed easily.  Psychiatric/Behavioral: Negative for depression and suicidal ideas.  The patient does not have insomnia.       Allergies  Allergen Reactions  . Contrast Media [Iodinated Diagnostic Agents] Shortness Of Breath    Per CT report in 2014 pt had break through contrast reaction with hives and SOB following 13 hour prep. MSY   . Metrizamide Shortness Of Breath  . Penicillins Hives and Other (See Comments)    Other reaction(s): Other (See Comments) Has patient had a PCN reaction causing immediate rash, facial/tongue/throat swelling, SOB or lightheadedness with hypotension: Yes Has patient had a PCN reaction causing severe rash involving mucus membranes or skin necrosis: No Has patient had a PCN reaction that required hospitalization No Has patient had a PCN reaction occurring within the last 10 years: No If all of the above answers are "NO", then may proceed with Cephalosporin use. Has patient had a PCN reaction causing immediate rash, facial/tongue/throat swelling, SOB or lightheadedness with hypotension: Yes Has patient had a PCN reaction causing severe rash involving mucus membranes or skin necrosis: No Has patient had a PCN reaction that required hospitalization No Has patient had a PCN reaction occurring within the last 10 years: No If all of the above answers are "NO", then may proceed with Cephalosporin use.   . Isosorbide Nitrate     Other reaction(s): Headache  . Latex Hives  . Ondansetron Other (See Comments)    Severe headache  . Zofran [Ondansetron Hcl] Other (See Comments)    Causes migraines  . Povidone-Iodine Rash    Blistering rash  . Pulmicort [Budesonide] Itching     Past Medical History:  Diagnosis Date  . Asthma 2013  . 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 CHF (congestive heart failure) ([place])   . COPD (chronic obstructive  pulmonary disease) ([place])   . Diverticulitis 2010  . DVT (deep venous thrombosis) ([place])   . Endometriosis 1990  . Heart disease 2013  . Hx MRSA infection   . Iron deficiency   . Lump or mass in breast   . Restless leg      Past Surgical History:  Procedure Laterality Date  . ABDOMINAL HYSTERECTOMY     age 31  . BREAST BIOPSY Right 2014   benign  . CARDIAC DEFIBRILLATOR PLACEMENT    . CARDIAC DEFIBRILLATOR PLACEMENT    . CESAREAN SECTION    . CHOLECYSTECTOMY    . COLECTOMY    . COLONOSCOPY WITH PROPOFOL N/A 02/22/2018   Procedure: COLONOSCOPY WITH PROPOFOL;  Surgeon: Toledo, Benay Pike, MD;  Location: [facility] ENDOSCOPY;  Service: Gastroenterology;  Laterality: N/A;  . ESOPHAGOGASTRODUODENOSCOPY (EGD) WITH PROPOFOL N/A 02/22/2018   Procedure: ESOPHAGOGASTRODUODENOSCOPY (EGD) WITH PROPOFOL;  Surgeon: Toledo, Benay Pike, MD;  Location: [facility] ENDOSCOPY;  Service: Gastroenterology;  Laterality: N/A;  . NASAL SINUS SURGERY  2012  . OOPHORECTOMY      Social History   Socioeconomic History  . Marital status: Married    Spouse name: Not on file  . Number of children: Not on file  . Years of education: Not on file  . Highest education level: Not on file  Occupational History  . Occupation: works at [employer].  Social Needs  . Financial resource strain: Not on file  . Food insecurity:    Worry: Not on file    Inability: Not on file  . Transportation needs:    Medical: Not on file    Non-medical: Not on file  Tobacco Use  . Smoking status: Never Smoker  . Smokeless  tobacco: Never Used  Substance and Sexual Activity  . Alcohol use: No  . Drug use: No  . Sexual activity: Yes    Birth control/protection: Post-menopausal  Lifestyle  . Physical activity:    Days per week: Not on file    Minutes per session: Not on file  . Stress: Not on file  Relationships  . Social connections:    Talks on phone: Not on file    Gets together: Not on file    Attends religious service: Not on  file    Active member of club or organization: Not on file    Attends meetings of clubs or organizations: Not on file    Relationship status: Not on file  . Intimate partner violence:    Fear of current or ex partner: Not on file    Emotionally abused: Not on file    Physically abused: Not on file    Forced sexual activity: Not on file  Other Topics Concern  . Not on file  Social History Narrative  . Not on file    Family History  Problem Relation Age of Onset  . Cancer Mother 30       ovarian  . CAD Mother   . Cancer Father 92       brain  . CAD Father   . Cancer Daughter 18       skin  . Cancer Maternal Aunt 34       breast  . Leukemia Paternal Grandfather      Current Outpatient Medications:  .  albuterol (ACCUNEB) 1.25 MG/3ML nebulizer solution, Take 3 mLs by nebulization every 6 (six) hours as needed for wheezing., Disp: , Rfl: 12 .  baclofen (LIORESAL) 10 MG tablet, Take 10 mg by mouth 2 (two) times daily., Disp: , Rfl:  .  beclomethasone (QVAR) 80 MCG/ACT inhaler, Inhale 2 puffs into the lungs 2 (two) times daily., Disp: 1 Inhaler, Rfl: 5 .  busPIRone (BUSPAR) 15 MG tablet, Take 15 mg by mouth 2 (two) times daily., Disp: , Rfl:  .  cyanocobalamin 500 MCG tablet, Take 500 mcg by mouth daily., Disp: , Rfl:  .  furosemide (LASIX) 40 MG tablet, Take 40 mg by mouth 2 (two) times daily., Disp: , Rfl:  .  levothyroxine (SYNTHROID, LEVOTHROID) 150 MCG tablet, Take 150 mcg by mouth daily before breakfast., Disp: , Rfl:  .  losartan (COZAAR) 25 MG tablet, Take 25 mg by mouth daily. , Disp: , Rfl:  .  meloxicam (MOBIC) 15 MG tablet, Take 15 mg by mouth daily., Disp: , Rfl:  .  Multiple Vitamin (MULTIVITAMIN WITH MINERALS) TABS tablet, Take 1 tablet by mouth daily., Disp: , Rfl:  .  oxyCODONE-acetaminophen (PERCOCET) 5-325 MG tablet, Take 1-2 tablets by mouth every 4 (four) hours as needed., Disp: 15 tablet, Rfl: 0 .  potassium chloride SA (KLOR-CON M20) 20 MEQ tablet, Take 1  tablet (20 mEq total) by mouth 2 (two) times daily for 7 days. (Patient taking differently: Take 40 mEq by mouth daily as needed. 1 tablet twice a day), Disp: 14 tablet, Rfl: 0 .  rOPINIRole (REQUIP) 2 MG tablet, Take 2 tablets (4 mg total) by mouth at bedtime. 2-4  Mg per night (Patient taking differently: Take 4 mg by mouth at bedtime. ), Disp: , Rfl:  .  sertraline (ZOLOFT) 50 MG tablet, Take 50 mg by mouth daily., Disp: , Rfl:  .  spironolactone (ALDACTONE) 25 MG tablet, Take 25 mg by  mouth 2 (two) times daily. , Disp: , Rfl:  .  traMADol (ULTRAM) 50 MG tablet, Take 50 mg by mouth every 6 (six) hours as needed., Disp: , Rfl:  .  Venlafaxine HCl 37.5 MG TB24, Take 37.5 mg by mouth daily., Disp: , Rfl:  .  nitroGLYCERIN (NITROSTAT) 0.4 MG SL tablet, Place 1 tablet (0.4 mg total) under the tongue every 5 (five) minutes as needed for chest pain. (Patient not taking: Reported on 09/24/2018), Disp: 90 tablet, Rfl: 1  Physical exam:  Vitals:   09/24/18 1056  BP: 140/84  Pulse: (!) 109  Resp: 18  Temp: 98 F (36.7 C)  TempSrc: Oral  Weight: 232 lb (105.2 kg)   Physical Exam Constitutional:      Appearance: She is obese.  HENT:     Head: Normocephalic and atraumatic.  Eyes:     Pupils: Pupils are equal, round, and reactive to light.  Neck:     Musculoskeletal: Normal range of motion.  Cardiovascular:     Rate and Rhythm: Normal rate and regular rhythm.     Heart sounds: Normal heart sounds.  Pulmonary:     Effort: Pulmonary effort is normal.     Breath sounds: Normal breath sounds.  Abdominal:     General: Bowel sounds are normal.     Palpations: Abdomen is soft.     Comments: Tenderness to palpation in the epigastrium  Skin:    General: Skin is warm and dry.  Neurological:     Mental Status: She is alert and oriented to person, place, and time.      CMP Latest Ref Rng & Units 09/23/2018  Glucose 70 - 99 mg/dL 114(H)  BUN 6 - 20 mg/dL 16  Creatinine 0.44 - 1.00 mg/dL 0.95    Sodium 135 - 145 mmol/L 138  Potassium 3.5 - 5.1 mmol/L 3.3(L)  Chloride 98 - 111 mmol/L 98  CO2 22 - 32 mmol/L 32  Calcium 8.9 - 10.3 mg/dL 8.6(L)  Total Protein 6.5 - 8.1 g/dL 7.7  Total Bilirubin 0.3 - 1.2 mg/dL 0.7  Alkaline Phos 38 - 126 U/L 96  AST 15 - 41 U/L 24  ALT 0 - 44 U/L 27   CBC Latest Ref Rng & Units 09/23/2018  WBC 4.0 - 10.5 K/uL 6.4  Hemoglobin 12.0 - 15.0 g/dL 14.0  Hematocrit 36.0 - 46.0 % 41.2  Platelets 150 - 400 K/uL 221    No images are attached to the encounter.  Ct Abdomen Pelvis Wo Contrast  Result Date: 09/23/2018 CLINICAL DATA:  53 year old with acute abdominal pain. Rectal bleeding. History of gastric lesion. EXAM: CT ABDOMEN AND PELVIS WITHOUT CONTRAST TECHNIQUE: Multidetector CT imaging of the abdomen and pelvis was performed following the standard protocol without IV contrast. COMPARISON:  05/25/2018 FINDINGS: Lower chest: Lung bases are clear. No pleural effusions. Cardiac defibrillator lead in the right ventricle. Hepatobiliary: Normal appearance of the liver. Cholecystectomy. No biliary dilatation. Pancreas: Unremarkable. No pancreatic ductal dilatation or surrounding inflammatory changes. Spleen: Normal in size without focal abnormality. Adrenals/Urinary Tract: Normal adrenal glands. Urinary bladder is decompressed. Normal appearance of both kidneys without hydronephrosis. Small left extrarenal pelvis is stable. Negative for kidney stones. Stomach/Bowel: Again noted is a low-density lesion along the anterior aspect of the gastric antrum that measures up to 2.5 cm and minimally changed from the prior exams. This appears to represent the previously biopsied gastric lesion. No evidence for gastric distension or inflammation. Normal appearance of the duodenum. Normal appearance  of the small bowel structures. Surgical anastomosis in the sigmoid colon. Appendix is not confidently identified. Vascular/Lymphatic: Vascular structures are unremarkable without  atherosclerotic calcifications. No significant lymph node enlargement in the abdomen or pelvis. Reproductive: Status post hysterectomy. No adnexal masses. Other: Surgical clips around the sigmoid mesocolon possibly related to a lymph node dissection. Negative for free fluid. Small midline ventral hernia containing fat below the umbilicus. Negative for free air. Musculoskeletal: No acute bone abnormality. IMPRESSION: 1. No acute abnormality in the abdomen or pelvis. 2. Stable appearance of the low-density gastric lesion. No acute inflammatory changes involving the stomach. 3. Stable postoperative changes as described. Electronically Signed   By: Markus Daft M.D.   On: 09/23/2018 11:29     Assessment and plan- Patient is a 53 y.o. female with chronic atrophic gastritis now found to have a well-differentiated carcinoid tumor of the stomach  1.  Patient had prior EGD with findings of chronic atrophic gastritis and neuroendocrine hyperplasia but did not have any evidence of carcinoid tumor back then.  She also had elevated gastrin levels and since she was taken off PPI her gastrin levels were also trending down.  Her most recent EUS showed a submucosal 2.5 cm mass which was biopsy-proven well-differentiated gastric carcinoid.  There was another random area of stomach that was also biopsied which also showed a carcinoid.  At this time the patient needs to discuss these findings with GI to see if it would be possible to resect these areas endoscopically as resection is the mainstay of treatment.  If there is more extensive involvement by neuroendocrine tumor she will have to be referred to a surgeon to see if there would be any role for a more extensive surgery  2.  At this time I will obtain a gallium PET scan to see if there is any evidence of metastases although I suspect that this gastric carcinoid is localized and as such there would be no role for any systemic treatment at this time as resection is the  mainstay  3.  I also do not think that her episodes of abdominal pain are related to her small area of carcinoid that was found.  It may probably be related to her chronic atrophic gastritis and she needs to follow-up with GI for her abdominal pain  4.  Patient had prior evidence of iron deficiency anemia and had received Venofer.  I will be checking her ferritin and iron studies as well as B12 at this time  I will see her back in 2 to 3 weeks time to discuss the results of her gallium PET CT scan   Visit Diagnosis 1. Elevated gastrin level   2. Iron deficiency anemia, unspecified iron deficiency anemia type   3. 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 Randa Evens, MD, MPH [facility] at [facility] [phone] 09/25/2018 12:40 PM

## 2018-09-26 ENCOUNTER — Telehealth: Payer: Self-pay | Admitting: *Deleted

## 2018-09-26 NOTE — Telephone Encounter (Signed)
Patient was urgently seen on January 20 because of abdominal pain in the ER.  Patient has been diagnosed with a carcinoid from a GI doctor at [facility] in [place].  We see patient for iron deficiency anemia.  The Janese Banks put her recommendations in her note and I faxed it to Dr. Kenton Kingfisher office at [phone]

## 2018-09-27 ENCOUNTER — Telehealth: Payer: Self-pay | Admitting: *Deleted

## 2018-09-27 NOTE — Telephone Encounter (Signed)
Tried calling patient 2 times today about her gallium PET scan has been scheduled for January 31 arrival at 2:00 at the [facility].  No answer 3 patient and no availability to leave a voicemail.  Called husband and his number was no availability to leave a voicemail either.  I did send an email to the patient based on her email address that she had left with [place] in the chart.  I have asked the patient to call me back if she gets this email and I will continue trying getting her by telephone

## 2018-10-01 ENCOUNTER — Telehealth: Payer: Self-pay | Admitting: *Deleted

## 2018-10-01 NOTE — Telephone Encounter (Signed)
Called patient after she sent me an email with her and telephone numbers that have been changed.  Her know that she did get my message from the email that she is got a scan on  January 31.  Splane to her that someone from nuclear med would be calling her a few days prior to her testing going over the prep for.  Patient said that she had reacted to a CT scan before wanted to make sure it was not the same prep.  I put her on hold called nuclear medicine know there is saline base for the actual scan it is not the same as  the CT prep . Went back telephone  and told the patient that and someone should still be calling her and going over what the prep.  Patient agreeable to plan

## 2018-10-01 NOTE — Telephone Encounter (Signed)
Patient just called back and said she just called Dr. Ricky Stabs office to get a refill of her pain medicine.  He was told by Dr. Ricky Stabs office that it was not their responsibility to give pain medicine but Dr. Elroy Channel responsibility.  I had told patient over the phone that we had had this issue before and that she was seen you from a iron deficiency anemia part but since she does have the new diagnosis of carcinoid I will double check again with Dr. Janese Banks and let her know but she is in a meeting and I will not be able to see her until tomorrow morning.  And agreeable that I will ask and call her back

## 2018-10-02 ENCOUNTER — Encounter: Payer: Self-pay | Admitting: Emergency Medicine

## 2018-10-02 ENCOUNTER — Other Ambulatory Visit: Payer: Self-pay

## 2018-10-02 ENCOUNTER — Emergency Department
Admission: EM | Admit: 2018-10-02 | Discharge: 2018-10-02 | Disposition: A | Discharge status: Home / Self Care | Payer: Medicaid Other | Source: Home / Self Care | Attending: Emergency Medicine | Admitting: Emergency Medicine

## 2018-10-02 DIAGNOSIS — Z79899 Other long term (current) drug therapy: Secondary | ICD-10-CM | POA: Insufficient documentation

## 2018-10-02 DIAGNOSIS — J45909 Unspecified asthma, uncomplicated: Secondary | ICD-10-CM

## 2018-10-02 DIAGNOSIS — R109 Unspecified abdominal pain: Principal | ICD-10-CM

## 2018-10-02 DIAGNOSIS — R1013 Epigastric pain: Secondary | ICD-10-CM

## 2018-10-02 DIAGNOSIS — I5022 Chronic systolic (congestive) heart failure: Secondary | ICD-10-CM | POA: Insufficient documentation

## 2018-10-02 DIAGNOSIS — G8929 Other chronic pain: Secondary | ICD-10-CM

## 2018-10-02 LAB — CBC WITH DIFFERENTIAL/PLATELET
Abs Immature Granulocytes: 0.05 10*3/uL (ref 0.00–0.07)
Basophils Absolute: 0 10*3/uL (ref 0.0–0.1)
Basophils Relative: 1 %
Eosinophils Absolute: 0.2 10*3/uL (ref 0.0–0.5)
Eosinophils Relative: 2 %
HCT: 39.5 % (ref 36.0–46.0)
Hemoglobin: 13.8 g/dL (ref 12.0–15.0)
Immature Granulocytes: 1 %
Lymphocytes Relative: 31 %
Lymphs Abs: 2.8 10*3/uL (ref 0.7–4.0)
MCH: 30.9 pg (ref 26.0–34.0)
MCHC: 34.9 g/dL (ref 30.0–36.0)
MCV: 88.4 fL (ref 80.0–100.0)
MONO ABS: 0.7 10*3/uL (ref 0.1–1.0)
Monocytes Relative: 7 %
NEUTROS ABS: 5.2 10*3/uL (ref 1.7–7.7)
Neutrophils Relative %: 58 %
Platelets: 241 10*3/uL (ref 150–400)
RBC: 4.47 MIL/uL (ref 3.87–5.11)
RDW: 13.2 % (ref 11.5–15.5)
WBC: 8.9 10*3/uL (ref 4.0–10.5)
nRBC: 0 % (ref 0.0–0.2)

## 2018-10-02 LAB — COMPREHENSIVE METABOLIC PANEL
ALK PHOS: 85 U/L (ref 38–126)
ALT: 23 U/L (ref 0–44)
AST: 21 U/L (ref 15–41)
Albumin: 3.9 g/dL (ref 3.5–5.0)
Anion gap: 8 (ref 5–15)
BUN: 16 mg/dL (ref 6–20)
CALCIUM: 8.3 mg/dL — AB (ref 8.9–10.3)
CO2: 29 mmol/L (ref 22–32)
Chloride: 99 mmol/L (ref 98–111)
Creatinine, Ser: 0.93 mg/dL (ref 0.44–1.00)
GFR calc Af Amer: >60 mL/min (ref >60)
GFR calc non Af Amer: >60 mL/min (ref >60)
Glucose, Bld: 117 mg/dL — ABNORMAL HIGH (ref 70–99)
Potassium: 2.9 mmol/L — ABNORMAL LOW (ref 3.5–5.1)
Sodium: 136 mmol/L (ref 135–145)
TOTAL PROTEIN: 7.6 g/dL (ref 6.5–8.1)
Total Bilirubin: 0.8 mg/dL (ref 0.3–1.2)

## 2018-10-02 LAB — LIPASE, BLOOD: Lipase: 31 U/L (ref 11–51)

## 2018-10-02 MED ORDER — MORPHINE SULFATE (PF) 4 MG/ML IV SOLN
4.0000 mg | Freq: Once | INTRAVENOUS | Status: AC
  Administered 2018-10-02: 4 mg via INTRAVENOUS
  Filled 2018-10-02: qty 1

## 2018-10-02 MED ORDER — POTASSIUM CHLORIDE CRYS ER 20 MEQ PO TBCR
40.0000 meq | EXTENDED_RELEASE_TABLET | Freq: Once | ORAL | Status: AC
  Administered 2018-10-02: 40 meq via ORAL
  Filled 2018-10-02: qty 2

## 2018-10-02 MED ORDER — METOCLOPRAMIDE HCL 5 MG/ML IJ SOLN
10.0000 mg | Freq: Once | INTRAMUSCULAR | Status: AC
  Administered 2018-10-02: 10 mg via INTRAVENOUS
  Filled 2018-10-02: qty 2

## 2018-10-02 MED ORDER — FENTANYL 50 MCG/HR TD PT72
1.0000 | MEDICATED_PATCH | TRANSDERMAL | Status: DC
  Administered 2018-10-02: 1 via TRANSDERMAL
  Filled 2018-10-02: qty 1

## 2018-10-02 NOTE — ED Triage Notes (Signed)
Pt to triage via w/c, appears uncomfortable; Pt reports awoke with abd cramping accomp by N/V; st that she has "tumors in her stomach"

## 2018-10-02 NOTE — ED Notes (Signed)
Pt verbalized understanding of d/c instruction, medication, and f/u care. No further questions at this time. Pt ambulatory to the exit with steady gait accompanied by husband.

## 2018-10-02 NOTE — ED Provider Notes (Signed)
[facility] Emergency Department Provider Note   First MD Initiated Contact with Patient 10/02/18 0430     (approximate)  I have reviewed the triage vital signs and the nursing notes.   HISTORY  Chief Complaint Abdominal Pain    HPI Brittany Nguyen is a 53 y.o. female with below list of chronic medical conditions including gastric carcinoid tumor presents to the emergency department with 10 out of 10 epigastric abdominal pain with associated nausea and vomiting.  Patient states that she was referred to come to the emergency department by Dr. Alice Reichert.  Patient states that no one is willing to prescribe her pain medications.  Patient states that her primary care Dr. Kary Kos states that chronic pain should be handled by the oncologist Dr. Janese Banks who states that it should be handled by Dr. Kary Kos.   Past Medical History:  Diagnosis Date  . Asthma 2013  . 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 CHF (congestive heart failure) ([place])   . COPD (chronic obstructive pulmonary disease) ([place])   . Diverticulitis 2010  . DVT (deep venous thrombosis) ([place])   . Endometriosis 1990  . Heart disease 2013  . Hx MRSA infection   . Iron deficiency   . Lump or mass in breast   . Restless leg     Patient Active Problem List   Diagnosis Date Noted  . Iron deficiency anemia 03/27/2018  . Anemia 03/26/2018  . Abdominal pain 02/20/2018  . Unstable angina ([place])   . Encounter for anticoagulation discussion and counseling   . C. difficile colitis 01/14/2018  . GIB (gastrointestinal bleeding) 01/08/2018  . SOB (shortness of breath) 11/01/2017  . Influenza A 10/29/2017  . Acute respiratory failure ([place]) 09/24/2017  . Sepsis ([place]) 07/03/2017  . COPD (chronic obstructive pulmonary disease) ([place]) 04/26/2017  . Community acquired pneumonia 04/25/2017  . Chronic systolic heart failure ([place]) 02/03/2017  . Hypotension 02/03/2017  . Chest pain 12/09/2016  . RLS  (restless legs syndrome) 12/09/2016  . Cough, persistent 11/22/2015  . Hypokalemia 11/22/2015  . Leukocytosis 11/22/2015  . Dyspnea 11/21/2015  . Respiratory distress 09/19/2015  . Breast pain 01/11/2013  . Hx MRSA infection   . Lump or mass in breast   . GOITER, MULTINODULAR 01/20/2009  . HYPOGLYCEMIA, UNSPECIFIED 01/20/2009  . GERD 01/20/2009  . DIVERTICULITIS OF COLON 01/20/2009    Past Surgical History:  Procedure Laterality Date  . ABDOMINAL HYSTERECTOMY     age 86  . BREAST BIOPSY Right 2014   benign  . CARDIAC DEFIBRILLATOR PLACEMENT    . CARDIAC DEFIBRILLATOR PLACEMENT    . CESAREAN SECTION    . CHOLECYSTECTOMY    . COLECTOMY    . COLONOSCOPY WITH PROPOFOL N/A 02/22/2018   Procedure: COLONOSCOPY WITH PROPOFOL;  Surgeon: Toledo, Benay Pike, MD;  Location: [facility] ENDOSCOPY;  Service: Gastroenterology;  Laterality: N/A;  . ESOPHAGOGASTRODUODENOSCOPY (EGD) WITH PROPOFOL N/A 02/22/2018   Procedure: ESOPHAGOGASTRODUODENOSCOPY (EGD) WITH PROPOFOL;  Surgeon: Toledo, Benay Pike, MD;  Location: [facility] ENDOSCOPY;  Service: Gastroenterology;  Laterality: N/A;  . NASAL SINUS SURGERY  2012  . OOPHORECTOMY      Prior to Admission medications   Medication Sig Start Date End Date Taking? Authorizing Provider  albuterol (ACCUNEB) 1.25 MG/3ML nebulizer solution Take 3 mLs by nebulization every 6 (six) hours as needed for wheezing. 11/09/17   [provider]  baclofen (LIORESAL) 10 MG tablet Take 10 mg by mouth 2 (two) times daily. 06/11/18  [provider]  beclomethasone (QVAR) 80 MCG/ACT inhaler Inhale 2 puffs into the lungs 2 (two) times daily. 06/05/17 09/24/18  Laverle Hobby, MD  busPIRone (BUSPAR) 15 MG tablet Take 15 mg by mouth 2 (two) times daily. 05/08/18   [provider]  cyanocobalamin 500 MCG tablet Take 500 mcg by mouth daily.    [provider]  furosemide (LASIX) 40 MG tablet Take 40 mg by mouth 2 (two) times daily. 07/09/18   [provider]  levothyroxine (SYNTHROID, LEVOTHROID) 150 MCG tablet Take 150 mcg by mouth daily before breakfast.    [provider]  losartan (COZAAR) 25 MG tablet Take 25 mg by mouth daily.     [provider]  meloxicam (MOBIC) 15 MG tablet Take 15 mg by mouth daily. 08/30/18   [provider]  Multiple Vitamin (MULTIVITAMIN WITH MINERALS) TABS tablet Take 1 tablet by mouth daily.    [provider]  nitroGLYCERIN (NITROSTAT) 0.4 MG SL tablet Place 1 tablet (0.4 mg total) under the tongue every 5 (five) minutes as needed for chest pain. Patient not taking: Reported on 09/24/2018 01/24/17   Henreitta Leber, MD  oxyCODONE-acetaminophen (PERCOCET) 5-325 MG tablet Take 1-2 tablets by mouth every 4 (four) hours as needed. 09/23/18 09/23/19  Schaevitz, Randall An, MD  potassium chloride SA (KLOR-CON M20) 20 MEQ tablet Take 1 tablet (20 mEq total) by mouth 2 (two) times daily for 7 days. Patient taking differently: Take 40 mEq by mouth daily as needed. 1 tablet twice a day 06/17/18 09/24/18  Hinda Kehr, MD  rOPINIRole (REQUIP) 2 MG tablet Take 2 tablets (4 mg total) by mouth at bedtime. 2-4  Mg per night Patient taking differently: Take 4 mg by mouth at bedtime.  01/16/18   Loletha Grayer, MD  sertraline (ZOLOFT) 50 MG tablet Take 50 mg by mouth daily.    [provider]  spironolactone (ALDACTONE) 25 MG tablet Take 25 mg by mouth 2 (two) times daily.     [provider]  traMADol (ULTRAM) 50 MG tablet Take 50 mg by mouth every 6 (six) hours as needed.    [provider]  Venlafaxine HCl 37.5 MG TB24 Take 37.5 mg by mouth daily. 08/15/18   [provider]    Allergies Contrast media [iodinated diagnostic agents]; Metrizamide; Penicillins; Isosorbide nitrate; Latex; Ondansetron; Zofran [ondansetron hcl]; Povidone-iodine; and Pulmicort [budesonide]  Family History  Problem Relation Age of Onset  . Cancer Mother 30        ovarian  . CAD Mother   . Cancer Father 65       brain  . CAD Father   . Cancer Daughter 18       skin  . Cancer Maternal Aunt 34       breast  . Leukemia Paternal Grandfather     Social History Social History   Tobacco Use  . Smoking status: Never Smoker  . Smokeless tobacco: Never Used  Substance Use Topics  . Alcohol use: No  . Drug use: No    Review of Systems Constitutional: No fever/chills Eyes: No visual changes. ENT: No sore throat. Cardiovascular: Denies chest pain. Respiratory: Denies shortness of breath. Gastrointestinal: Positive for epigastric abdominal pain.  No nausea, no vomiting.  No diarrhea.  No constipation. Genitourinary: Negative for dysuria. Musculoskeletal: Negative for neck pain.  Negative for back pain. Integumentary: Negative for rash. Neurological: Negative for headaches, focal weakness or numbness.   ____________________________________________   PHYSICAL EXAM:  VITAL SIGNS: ED Triage Vitals  Enc Vitals Group     BP 10/02/18 0146 (!) 116/45     Pulse Rate 10/02/18 0146 (!) 120     Resp 10/02/18 0146 (!) 22     Temp 10/02/18 0146 97.6 F (36.4 C)     Temp Source 10/02/18 0146 Oral     SpO2 10/02/18 0146 99 %     Weight 10/02/18 0145 104.3 kg (230 lb)     Height 10/02/18 0145 1.651 m (5\' 5" )     Head Circumference --      Peak Flow --      Pain Score 10/02/18 0145 10     Pain Loc --      Pain Edu? --      Excl. in [place]? --     Constitutional: Alert and oriented.  Apparent discomfort.   Eyes: Conjunctivae are normal. Mouth/Throat: Mucous membranes are moist.  Oropharynx non-erythematous. Neck: No stridor.   Cardiovascular: Normal rate, regular rhythm. Good peripheral circulation. Grossly normal heart sounds. Respiratory: Normal respiratory effort.  No retractions. Lungs CTAB. Gastrointestinal:Epigastric pain with palpation. No distention.   Musculoskeletal: No lower extremity tenderness nor edema. No gross deformities of  extremities. Neurologic:  Normal speech and language. No gross focal neurologic deficits are appreciated.  Skin:  Skin is warm, dry and intact. No rash noted.   Psychiatric: Mood and affect are normal. Speech and behavior are normal.  ____________________________________________   LABS (all labs ordered are listed, but only abnormal results are displayed)  Labs Reviewed  COMPREHENSIVE METABOLIC PANEL - Abnormal; Notable for the following components:      Result Value   Potassium 2.9 (*)    Glucose, Bld 117 (*)    Calcium 8.3 (*)    All other components within normal limits  CBC WITH DIFFERENTIAL/PLATELET  LIPASE, BLOOD  URINALYSIS, COMPLETE (UACMP) WITH MICROSCOPIC    Procedures   ____________________________________________   INITIAL IMPRESSION / ASSESSMENT AND PLAN / ED COURSE  As part of my medical decision making, I reviewed the following data within the electronic MEDICAL RECORD NUMBER   53 year old female presenting with above-stated history and physical exam secondary to upper abdominal discomfort.  I reviewed the patient's chart as patient was recently seen in the emergency department on 09/23/2018 for the same at which point CT scan was performed which revealed no acute abnormality in the abdomen and pelvis with stable appearance of the gastric lesion.  Patient states pain is consistent with that visit.  And as such no further imaging performed.  I did discuss the patient with Dr. Janese Banks oncologist on call who stated that she did refer the patient to primary care provider for continued pain management.  Given the fact that the patient has seen multiple providers for chronic pain control without success I will refer the patient to chronic pain management Dr. Holley Raring.  Patient and her husband were grateful for the referral.  Patient given IV morphine in the emergency department 4 mg with improvement of pain.  Fentanyl patch 50 mcg  applied. ____________________________________________  FINAL CLINICAL IMPRESSION(S) / ED DIAGNOSES  Final diagnoses:  None     MEDICATIONS GIVEN DURING THIS VISIT:  Medications  fentaNYL (DURAGESIC) 50 MCG/HR 1 patch (has no administration in time range)  potassium chloride SA (K-DUR,KLOR-CON) CR tablet 40 mEq (has no administration in time range)  metoCLOPramide (REGLAN) injection 10 mg (has no administration in time range)  morphine 4 MG/ML injection 4 mg (4 mg Intravenous  Given 10/02/18 0501)     ED Discharge Orders    None       Note:  This document was prepared using Dragon voice recognition software and may include unintentional dictation errors.    Gregor Hams, MD 10/02/18 [phone]

## 2018-10-03 ENCOUNTER — Other Ambulatory Visit: Payer: Self-pay | Admitting: Internal Medicine

## 2018-10-03 ENCOUNTER — Encounter: Payer: Self-pay | Admitting: Emergency Medicine

## 2018-10-03 ENCOUNTER — Inpatient Hospital Stay
Admission: EM | Admit: 2018-10-03 | Discharge: 2018-10-05 | DRG: 948 | Disposition: A | Discharge status: Home / Self Care | Payer: Medicaid Other | Attending: Internal Medicine | Admitting: Internal Medicine

## 2018-10-03 ENCOUNTER — Other Ambulatory Visit: Payer: Self-pay

## 2018-10-03 DIAGNOSIS — Z880 Allergy status to penicillin: Secondary | ICD-10-CM

## 2018-10-03 DIAGNOSIS — Z79890 Hormone replacement therapy (postmenopausal): Secondary | ICD-10-CM | POA: Diagnosis not present

## 2018-10-03 DIAGNOSIS — E039 Hypothyroidism, unspecified: Secondary | ICD-10-CM | POA: Diagnosis present

## 2018-10-03 DIAGNOSIS — D3A8 Other benign neuroendocrine tumors: Secondary | ICD-10-CM | POA: Diagnosis not present

## 2018-10-03 DIAGNOSIS — Z95810 Presence of automatic (implantable) cardiac defibrillator: Secondary | ICD-10-CM | POA: Diagnosis not present

## 2018-10-03 DIAGNOSIS — I5022 Chronic systolic (congestive) heart failure: Secondary | ICD-10-CM | POA: Diagnosis present

## 2018-10-03 DIAGNOSIS — R109 Unspecified abdominal pain: Secondary | ICD-10-CM | POA: Diagnosis present

## 2018-10-03 DIAGNOSIS — Z8614 Personal history of Methicillin resistant Staphylococcus aureus infection: Secondary | ICD-10-CM | POA: Diagnosis not present

## 2018-10-03 DIAGNOSIS — Z91041 Radiographic dye allergy status: Secondary | ICD-10-CM

## 2018-10-03 DIAGNOSIS — Z791 Long term (current) use of non-steroidal anti-inflammatories (NSAID): Secondary | ICD-10-CM | POA: Diagnosis not present

## 2018-10-03 DIAGNOSIS — Z808 Family history of malignant neoplasm of other organs or systems: Secondary | ICD-10-CM | POA: Diagnosis not present

## 2018-10-03 DIAGNOSIS — Z91040 Latex allergy status: Secondary | ICD-10-CM | POA: Diagnosis not present

## 2018-10-03 DIAGNOSIS — C7A092 Malignant carcinoid tumor of the stomach: Secondary | ICD-10-CM | POA: Diagnosis present

## 2018-10-03 DIAGNOSIS — R1013 Epigastric pain: Secondary | ICD-10-CM | POA: Diagnosis not present

## 2018-10-03 DIAGNOSIS — I428 Other cardiomyopathies: Secondary | ICD-10-CM | POA: Diagnosis present

## 2018-10-03 DIAGNOSIS — Z8249 Family history of ischemic heart disease and other diseases of the circulatory system: Secondary | ICD-10-CM

## 2018-10-03 DIAGNOSIS — Z806 Family history of leukemia: Secondary | ICD-10-CM | POA: Diagnosis not present

## 2018-10-03 DIAGNOSIS — G2581 Restless legs syndrome: Secondary | ICD-10-CM | POA: Diagnosis present

## 2018-10-03 DIAGNOSIS — E876 Hypokalemia: Secondary | ICD-10-CM | POA: Diagnosis present

## 2018-10-03 DIAGNOSIS — Z8041 Family history of malignant neoplasm of ovary: Secondary | ICD-10-CM | POA: Diagnosis not present

## 2018-10-03 DIAGNOSIS — I110 Hypertensive heart disease with heart failure: Secondary | ICD-10-CM | POA: Diagnosis present

## 2018-10-03 DIAGNOSIS — G893 Neoplasm related pain (acute) (chronic): Principal | ICD-10-CM | POA: Diagnosis present

## 2018-10-03 DIAGNOSIS — E164 Increased secretion of gastrin: Secondary | ICD-10-CM | POA: Diagnosis present

## 2018-10-03 DIAGNOSIS — J449 Chronic obstructive pulmonary disease, unspecified: Secondary | ICD-10-CM | POA: Diagnosis present

## 2018-10-03 DIAGNOSIS — Z888 Allergy status to other drugs, medicaments and biological substances status: Secondary | ICD-10-CM | POA: Diagnosis not present

## 2018-10-03 DIAGNOSIS — D509 Iron deficiency anemia, unspecified: Secondary | ICD-10-CM | POA: Diagnosis not present

## 2018-10-03 DIAGNOSIS — D3A092 Benign carcinoid tumor of the stomach: Secondary | ICD-10-CM

## 2018-10-03 DIAGNOSIS — F329 Major depressive disorder, single episode, unspecified: Secondary | ICD-10-CM | POA: Diagnosis present

## 2018-10-03 LAB — HEPATIC FUNCTION PANEL
ALT: 35 U/L (ref 0–44)
AST: 28 U/L (ref 15–41)
Albumin: 4.6 g/dL (ref 3.5–5.0)
Alkaline Phosphatase: 100 U/L (ref 38–126)
Bilirubin, Direct: 0.1 mg/dL (ref 0.0–0.2)
Indirect Bilirubin: 0.7 mg/dL (ref 0.3–0.9)
Total Bilirubin: 0.8 mg/dL (ref 0.3–1.2)
Total Protein: 8.4 g/dL — ABNORMAL HIGH (ref 6.5–8.1)

## 2018-10-03 LAB — CBC WITH DIFFERENTIAL/PLATELET
Abs Immature Granulocytes: 0.06 10*3/uL (ref 0.00–0.07)
Basophils Absolute: 0.1 10*3/uL (ref 0.0–0.1)
Basophils Relative: 1 %
EOS PCT: 2 %
Eosinophils Absolute: 0.2 10*3/uL (ref 0.0–0.5)
HCT: 44.6 % (ref 36.0–46.0)
HEMOGLOBIN: 15.4 g/dL — AB (ref 12.0–15.0)
Immature Granulocytes: 1 %
LYMPHS PCT: 27 %
Lymphs Abs: 2.6 10*3/uL (ref 0.7–4.0)
MCH: 31 pg (ref 26.0–34.0)
MCHC: 34.5 g/dL (ref 30.0–36.0)
MCV: 89.7 fL (ref 80.0–100.0)
Monocytes Absolute: 0.7 10*3/uL (ref 0.1–1.0)
Monocytes Relative: 7 %
Neutro Abs: 6 10*3/uL (ref 1.7–7.7)
Neutrophils Relative %: 62 %
Platelets: 263 10*3/uL (ref 150–400)
RBC: 4.97 MIL/uL (ref 3.87–5.11)
RDW: 13.2 % (ref 11.5–15.5)
WBC: 9.6 10*3/uL (ref 4.0–10.5)
nRBC: 0 % (ref 0.0–0.2)

## 2018-10-03 LAB — BASIC METABOLIC PANEL
Anion gap: 11 (ref 5–15)
BUN: 13 mg/dL (ref 6–20)
CO2: 31 mmol/L (ref 22–32)
Calcium: 8.9 mg/dL (ref 8.9–10.3)
Chloride: 96 mmol/L — ABNORMAL LOW (ref 98–111)
Creatinine, Ser: 0.96 mg/dL (ref 0.44–1.00)
GFR calc Af Amer: >60 mL/min (ref >60)
GFR calc non Af Amer: >60 mL/min (ref >60)
Glucose, Bld: 109 mg/dL — ABNORMAL HIGH (ref 70–99)
Potassium: 3.2 mmol/L — ABNORMAL LOW (ref 3.5–5.1)
Sodium: 138 mmol/L (ref 135–145)

## 2018-10-03 LAB — LIPASE, BLOOD: Lipase: 29 U/L (ref 11–51)

## 2018-10-03 MED ORDER — NITROGLYCERIN 0.4 MG SL SUBL: 0.4000 mg | SUBLINGUAL_TABLET | SUBLINGUAL | Status: DC | PRN

## 2018-10-03 MED ORDER — DIPHENHYDRAMINE HCL 50 MG/ML IJ SOLN
INTRAMUSCULAR | Status: AC
  Administered 2018-10-03: 12.5 mg via INTRAVENOUS
  Filled 2018-10-03: qty 1

## 2018-10-03 MED ORDER — MORPHINE SULFATE (PF) 4 MG/ML IV SOLN
4.0000 mg | Freq: Once | INTRAVENOUS | Status: AC
  Administered 2018-10-03: 4 mg via INTRAVENOUS
  Filled 2018-10-03: qty 1

## 2018-10-03 MED ORDER — HEPARIN SODIUM (PORCINE) 5000 UNIT/ML IJ SOLN
5000.0000 [IU] | Freq: Three times a day (TID) | INTRAMUSCULAR | Status: DC
  Administered 2018-10-03 – 2018-10-05 (×5): 5000 [IU] via SUBCUTANEOUS
  Filled 2018-10-03 (×5): qty 1

## 2018-10-03 MED ORDER — METOCLOPRAMIDE HCL 5 MG/ML IJ SOLN
10.0000 mg | Freq: Once | INTRAMUSCULAR | Status: AC
  Administered 2018-10-03: 10 mg via INTRAVENOUS
  Filled 2018-10-03: qty 2

## 2018-10-03 MED ORDER — BUSPIRONE HCL 15 MG PO TABS
15.0000 mg | ORAL_TABLET | Freq: Two times a day (BID) | ORAL | Status: DC
  Administered 2018-10-03 – 2018-10-05 (×4): 15 mg via ORAL
  Filled 2018-10-03 (×5): qty 1

## 2018-10-03 MED ORDER — OXYCODONE-ACETAMINOPHEN 5-325 MG PO TABS
1.0000 | ORAL_TABLET | ORAL | Status: DC | PRN
  Administered 2018-10-04 – 2018-10-05 (×6): 2 via ORAL
  Filled 2018-10-03 (×6): qty 2

## 2018-10-03 MED ORDER — BACLOFEN 10 MG PO TABS
10.0000 mg | ORAL_TABLET | Freq: Two times a day (BID) | ORAL | Status: DC
  Administered 2018-10-03 – 2018-10-05 (×4): 10 mg via ORAL
  Filled 2018-10-03 (×5): qty 1

## 2018-10-03 MED ORDER — MELOXICAM 7.5 MG PO TABS
15.0000 mg | ORAL_TABLET | Freq: Every day | ORAL | Status: DC
  Administered 2018-10-04: 15 mg via ORAL
  Filled 2018-10-03: qty 2

## 2018-10-03 MED ORDER — ROPINIROLE HCL 1 MG PO TABS
4.0000 mg | ORAL_TABLET | Freq: Every day | ORAL | Status: DC
  Administered 2018-10-04: 4 mg via ORAL
  Filled 2018-10-03: qty 4

## 2018-10-03 MED ORDER — ROPINIROLE HCL 1 MG PO TABS
2.0000 mg | ORAL_TABLET | Freq: Every day | ORAL | Status: DC
  Administered 2018-10-03: 4 mg via ORAL
  Filled 2018-10-03: qty 4

## 2018-10-03 MED ORDER — ALBUTEROL SULFATE (2.5 MG/3ML) 0.083% IN NEBU: 3.0000 mL | INHALATION_SOLUTION | Freq: Four times a day (QID) | RESPIRATORY_TRACT | Status: DC | PRN

## 2018-10-03 MED ORDER — LEVOTHYROXINE SODIUM 50 MCG PO TABS
150.0000 ug | ORAL_TABLET | Freq: Every day | ORAL | Status: DC
  Administered 2018-10-04 – 2018-10-05 (×2): 150 ug via ORAL
  Filled 2018-10-03 (×2): qty 1

## 2018-10-03 MED ORDER — MORPHINE SULFATE (PF) 2 MG/ML IV SOLN
2.0000 mg | INTRAVENOUS | Status: DC | PRN
  Administered 2018-10-03 – 2018-10-04 (×3): 2 mg via INTRAVENOUS
  Filled 2018-10-03 (×3): qty 1

## 2018-10-03 MED ORDER — BECLOMETHASONE DIPROPIONATE 80 MCG/ACT IN AERS: 2.0000 | INHALATION_SPRAY | Freq: Two times a day (BID) | RESPIRATORY_TRACT | Status: DC

## 2018-10-03 MED ORDER — POTASSIUM CHLORIDE CRYS ER 20 MEQ PO TBCR
20.0000 meq | EXTENDED_RELEASE_TABLET | Freq: Two times a day (BID) | ORAL | Status: DC
  Administered 2018-10-03 – 2018-10-05 (×4): 20 meq via ORAL
  Filled 2018-10-03 (×4): qty 1

## 2018-10-03 MED ORDER — DIPHENHYDRAMINE HCL 50 MG/ML IJ SOLN
12.5000 mg | Freq: Four times a day (QID) | INTRAMUSCULAR | Status: DC | PRN
  Administered 2018-10-03 – 2018-10-05 (×5): 12.5 mg via INTRAVENOUS
  Filled 2018-10-03 (×4): qty 1

## 2018-10-03 MED ORDER — FUROSEMIDE 40 MG PO TABS
40.0000 mg | ORAL_TABLET | Freq: Two times a day (BID) | ORAL | Status: DC
  Administered 2018-10-04 – 2018-10-05 (×3): 40 mg via ORAL
  Filled 2018-10-03 (×3): qty 1

## 2018-10-03 MED ORDER — DOCUSATE SODIUM 100 MG PO CAPS: 100.0000 mg | ORAL_CAPSULE | Freq: Two times a day (BID) | ORAL | Status: DC | PRN

## 2018-10-03 MED ORDER — ONDANSETRON HCL 4 MG/2ML IJ SOLN
4.0000 mg | Freq: Four times a day (QID) | INTRAMUSCULAR | Status: DC | PRN
  Administered 2018-10-04: 4 mg via INTRAVENOUS
  Filled 2018-10-03: qty 2

## 2018-10-03 MED ORDER — FENTANYL 50 MCG/HR TD PT72
1.0000 | MEDICATED_PATCH | TRANSDERMAL | Status: DC
  Filled 2018-10-03: qty 1

## 2018-10-03 MED ORDER — LEVOTHYROXINE SODIUM 50 MCG PO TABS: 150.0000 ug | ORAL_TABLET | Freq: Every day | ORAL | Status: DC

## 2018-10-03 MED ORDER — BUDESONIDE 0.5 MG/2ML IN SUSP
0.5000 mg | Freq: Two times a day (BID) | RESPIRATORY_TRACT | Status: DC
  Filled 2018-10-03: qty 2

## 2018-10-03 MED ORDER — SPIRONOLACTONE 25 MG PO TABS
25.0000 mg | ORAL_TABLET | Freq: Two times a day (BID) | ORAL | Status: DC
  Administered 2018-10-03 – 2018-10-05 (×4): 25 mg via ORAL
  Filled 2018-10-03 (×4): qty 1

## 2018-10-03 MED ORDER — LOSARTAN POTASSIUM 25 MG PO TABS
25.0000 mg | ORAL_TABLET | Freq: Every day | ORAL | Status: DC
  Administered 2018-10-04 – 2018-10-05 (×2): 25 mg via ORAL
  Filled 2018-10-03 (×2): qty 1

## 2018-10-03 MED ORDER — VENLAFAXINE HCL ER 37.5 MG PO CP24
37.5000 mg | ORAL_CAPSULE | Freq: Every day | ORAL | Status: DC
  Administered 2018-10-04 – 2018-10-05 (×2): 37.5 mg via ORAL
  Filled 2018-10-03 (×2): qty 1

## 2018-10-03 MED ORDER — OXYCODONE HCL 5 MG PO TABS
5.0000 mg | ORAL_TABLET | Freq: Once | ORAL | Status: AC
  Administered 2018-10-03: 5 mg via ORAL
  Filled 2018-10-03: qty 1

## 2018-10-03 MED ORDER — SODIUM CHLORIDE 0.9 % IV BOLUS
1000.0000 mL | Freq: Once | INTRAVENOUS | Status: AC
  Administered 2018-10-03: 1000 mL via INTRAVENOUS

## 2018-10-03 MED ORDER — VITAMIN B-12 1000 MCG PO TABS
500.0000 ug | ORAL_TABLET | Freq: Every day | ORAL | Status: DC
  Administered 2018-10-04 – 2018-10-05 (×2): 500 ug via ORAL
  Filled 2018-10-03 (×2): qty 1

## 2018-10-03 MED ORDER — POTASSIUM CHLORIDE CRYS ER 20 MEQ PO TBCR
20.0000 meq | EXTENDED_RELEASE_TABLET | Freq: Two times a day (BID) | ORAL | Status: DC
  Administered 2018-10-03 – 2018-10-04 (×2): 20 meq via ORAL
  Filled 2018-10-03 (×2): qty 1

## 2018-10-03 MED ORDER — TRAMADOL HCL 50 MG PO TABS
50.0000 mg | ORAL_TABLET | Freq: Four times a day (QID) | ORAL | Status: DC | PRN
  Administered 2018-10-04 – 2018-10-05 (×2): 50 mg via ORAL
  Filled 2018-10-03 (×2): qty 1

## 2018-10-03 NOTE — ED Notes (Signed)
Pt calm but tearful upon assessment. Pt reporting she has been seen in the ED multiple times for the nausea and abd pain associated with two tumors.

## 2018-10-03 NOTE — ED Notes (Signed)
Spoke with MD Jimmye Norman, no orders at this time.

## 2018-10-03 NOTE — ED Notes (Signed)
ED TO INPATIENT HANDOFF REPORT  Name/Age/Gender Brittany Nguyen 53 y.o. female  Code Status Code Status History    Date Active Date Inactive Code Status Order ID Comments User Context   02/20/2018 2355 02/23/2018 1702 Full Code [id number]  Lance Coon, MD Inpatient   01/30/2018 0145 02/01/2018 1608 Full Code [id number]  Arta Silence, MD Inpatient   01/30/2018 0145 01/30/2018 0145 Full Code [id number]  Arta Silence, MD Inpatient   01/14/2018 1553 01/16/2018 1636 Full Code [id number]  Bettey Costa, MD Inpatient   01/08/2018 2351 01/11/2018 2010 Full Code [id number]  Amelia Jo, MD ED   10/30/2017 0113 11/01/2017 1421 Full Code [id number]  Lance Coon, MD ED   09/24/2017 0848 09/27/2017 1409 Full Code [id number]  Loletha Grayer, MD ED   07/03/2017 0654 07/05/2017 1248 Full Code [id number]  Saundra Shelling, MD ED   04/25/2017 0146 04/26/2017 1516 Full Code [id number]  Hugelmeyer, Alexis, DO ED   01/28/2017 1033 01/30/2017 1741 Full Code [id number]  [name], MD Inpatient   01/22/2017 0114 01/24/2017 1759 Full Code [id number]  Lance Coon, MD Inpatient   12/09/2016 0209 12/09/2016 1507 Full Code [id number]  Lance Coon, MD Inpatient   11/21/2015 0650 11/22/2015 1550 Full Code [id number]  Saundra Shelling, MD Inpatient   09/19/2015 0511 09/21/2015 1829 Full Code [id number]  Harrie Foreman, MD Inpatient      Home/SNF/Other Home  Chief Complaint abd pain rectal bleeding vomiting  Level of Care/Admitting Diagnosis ED Disposition    ED Disposition Condition [place]: [place] [[id number]]  Level of Care: Med-Surg [16]  Diagnosis: Abdominal pain [850277]  Admitting Physician: Vaughan Basta [[id number]]  Attending Physician: Vaughan Basta [phone]  Estimated length of stay: past midnight tomorrow  Certification:: I certify this patient will need inpatient services for at least 2 midnights  PT Class (Do Not Modify): Inpatient [101]  PT Acc  Code (Do Not Modify): Private [1]       Medical History Past Medical History:  Diagnosis Date  . Asthma 2013  . 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 CHF (congestive heart failure) ([place])   . COPD (chronic obstructive pulmonary disease) ([place])   . Diverticulitis 2010  . DVT (deep venous thrombosis) ([place])   . Endometriosis 1990  . Heart disease 2013  . Hx MRSA infection   . Iron deficiency   . Lump or mass in breast   . Restless leg     Allergies Allergies  Allergen Reactions  . Contrast Media [Iodinated Diagnostic Agents] Shortness Of Breath    Per CT report in 2014 pt had break through contrast reaction with hives and SOB following 13 hour prep. MSY   . Metrizamide Shortness Of Breath  . Penicillins Hives and Other (See Comments)    Other reaction(s): Other (See Comments) Has patient had a PCN reaction causing immediate rash, facial/tongue/throat swelling, SOB or lightheadedness with hypotension: Yes Has patient had a PCN reaction causing severe rash involving mucus membranes or skin necrosis: No Has patient had a PCN reaction that required hospitalization No Has patient had a PCN reaction occurring within the last 10 years: No If all of the above answers are "NO", then may proceed with Cephalosporin use. Has patient had a PCN reaction causing immediate rash, facial/tongue/throat swelling, SOB or lightheadedness with hypotension: Yes Has patient had a PCN reaction causing severe rash involving mucus membranes or skin necrosis: No Has patient had a PCN reaction that required hospitalization  No Has patient had a PCN reaction occurring within the last 10 years: No If all of the above answers are "NO", then may proceed with Cephalosporin use.   . Isosorbide Nitrate     Other reaction(s): Headache  . Latex Hives  . Ondansetron Other (See Comments)    Severe headache  . Zofran [Ondansetron Hcl] Other (See Comments)    Causes migraines  . Povidone-Iodine Rash     Blistering rash  . Pulmicort [Budesonide] Itching    IV Location/Drains/Wounds Patient Lines/Drains/Airways Status   Active Line/Drains/Airways    Name:   Placement date:   Placement time:   Site:   Days:   Peripheral IV 10/03/18 Left Antecubital   10/03/18    1807    Antecubital   less than 1          Labs/Imaging Results for orders placed or performed during the hospital encounter of 10/03/18 (from the past 48 hour(s))  Basic metabolic panel     Status: Abnormal   Collection Time: 10/03/18  6:06 PM  Result Value Ref Range   Sodium 138 135 - 145 mmol/L   Potassium 3.2 (L) 3.5 - 5.1 mmol/L   Chloride 96 (L) 98 - 111 mmol/L   CO2 31 22 - 32 mmol/L   Glucose, Bld 109 (H) 70 - 99 mg/dL   BUN 13 6 - 20 mg/dL   Creatinine, Ser 0.96 0.44 - 1.00 mg/dL   Calcium 8.9 8.9 - 10.3 mg/dL   GFR calc non Af Amer >60 >60 mL/min   GFR calc Af Amer >60 >60 mL/min   Anion gap 11 5 - 15    Comment: Performed at [facility], [address] [place], [place] [address]  CBC with Differential     Status: Abnormal   Collection Time: 10/03/18  6:06 PM  Result Value Ref Range   WBC 9.6 4.0 - 10.5 K/uL   RBC 4.97 3.87 - 5.11 MIL/uL   Hemoglobin 15.4 (H) 12.0 - 15.0 g/dL   HCT 44.6 36.0 - 46.0 %   MCV 89.7 80.0 - 100.0 fL   MCH 31.0 26.0 - 34.0 pg   MCHC 34.5 30.0 - 36.0 g/dL   RDW 13.2 11.5 - 15.5 %   Platelets 263 150 - 400 K/uL   nRBC 0.0 0.0 - 0.2 %   Neutrophils Relative % 62 %   Neutro Abs 6.0 1.7 - 7.7 K/uL   Lymphocytes Relative 27 %   Lymphs Abs 2.6 0.7 - 4.0 K/uL   Monocytes Relative 7 %   Monocytes Absolute 0.7 0.1 - 1.0 K/uL   Eosinophils Relative 2 %   Eosinophils Absolute 0.2 0.0 - 0.5 K/uL   Basophils Relative 1 %   Basophils Absolute 0.1 0.0 - 0.1 K/uL   Immature Granulocytes 1 %   Abs Immature Granulocytes 0.06 0.00 - 0.07 K/uL    Comment: Performed at [facility], [address] [place], [place] [address]  Hepatic function panel     Status:  Abnormal   Collection Time: 10/03/18  6:06 PM  Result Value Ref Range   Total Protein 8.4 (H) 6.5 - 8.1 g/dL   Albumin 4.6 3.5 - 5.0 g/dL   AST 28 15 - 41 U/L   ALT 35 0 - 44 U/L   Alkaline Phosphatase 100 38 - 126 U/L   Total Bilirubin 0.8 0.3 - 1.2 mg/dL   Bilirubin, Direct 0.1 0.0 - 0.2 mg/dL   Indirect Bilirubin 0.7  0.3 - 0.9 mg/dL    Comment: Performed at [facility], [address] [place], [place] [address]  Lipase, blood     Status: None   Collection Time: 10/03/18  6:06 PM  Result Value Ref Range   Lipase 29 11 - 51 U/L    Comment: Performed at [facility], [address] [place], [place] [address]   No results found.  Pending Labs [employer] (From admission, onward)    Start     Ordered   Signed and Occupational hygienist morning,   R     Signed and Held   Signed and Held  CBC  Tomorrow morning,   R     Signed and Held   Signed and Held  CBC  (heparin)  Once,   R    Comments:  Baseline for heparin therapy IF NOT ALREADY DRAWN.  Notify MD if PLT < 100 K.    Signed and Held   Signed and Held  Creatinine, serum  (heparin)  Once,   R    Comments:  Baseline for heparin therapy IF NOT ALREADY DRAWN.    Signed and Held          Vitals/Pain Today's Vitals   10/03/18 1510 10/03/18 1513 10/03/18 1639 10/03/18 2026  BP: 121/60  107/73   Pulse:   100   Resp:   16   Temp:      TempSrc:      SpO2:   97%   Weight:  104.3 kg    Height:  5\' 5"  (1.651 m)    PainSc:  10-Worst pain ever  10-Worst pain ever    Isolation Precautions No active isolations  Medications Medications  oxyCODONE-acetaminophen (PERCOCET/ROXICET) 5-325 MG per tablet 1-2 tablet (has no administration in time range)  morphine 2 MG/ML injection 2 mg (has no administration in time range)  potassium chloride SA (K-DUR,KLOR-CON) CR tablet 20 mEq (has no administration in time range)  fentaNYL (DURAGESIC) 50 MCG/HR 1 patch (has no administration in time  range)  ondansetron (ZOFRAN) injection 4 mg (has no administration in time range)  diphenhydrAMINE (BENADRYL) injection 12.5 mg (12.5 mg Intravenous Given 10/03/18 2116)  morphine 4 MG/ML injection 4 mg (4 mg Intravenous Given 10/03/18 1808)  sodium chloride 0.9 % bolus 1,000 mL (0 mLs Intravenous Stopped 10/03/18 1950)  metoCLOPramide (REGLAN) injection 10 mg (10 mg Intravenous Given 10/03/18 1808)  oxyCODONE (Oxy IR/ROXICODONE) immediate release tablet 5 mg (5 mg Oral Given 10/03/18 2045)    Mobility walks

## 2018-10-03 NOTE — ED Triage Notes (Signed)
Pt arrives with complaints of abdominal pain. Pt states she has known "tumors in her stomach," and states "I can't have my operation yet but I cannot handle this pain." Pt was seen here last night and was given medication patch and was told to refer to Gastroenterologist. Pt contacted her gastroenterologist who states he does not write prescriptions. Pt then contacted her oncologist who suggested pt come here. Pt denies new complaints and states she is here for pain medication.

## 2018-10-03 NOTE — ED Provider Notes (Signed)
[facility] Emergency Department Provider Note ____________________________________________   First MD Initiated Contact with Patient 10/03/18 1638     (approximate)  I have reviewed the triage vital signs and the nursing notes.   HISTORY  Chief Complaint Abdominal Pain    HPI Brittany Nguyen is a 53 y.o. 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  The patient states that she previously had gastritis and was recently diagnosed with the tumor.  She has been issues getting her pain under control.  She was initially taking tramadol and then came to the ED 10 days ago.  She was given a course of Percocet at that time.  Since that time, she was unable to obtain prescription for pain medication either from oncology or from gastroenterology.  She states that her primary care doctor also was not able to provide a prescription for pain medication.  Patient came to the ED last night and had a fentanyl patch placed and was given IV morphine.  She states that this gave her relief at the time, but now the pain is just as bad as it has been.  The patient states that she has an appointment with a surgeon for 2 weeks from now, but has no pain medication to take in the meantime and states that she is unable to tolerate the pain at home.   Past Medical History:  Diagnosis Date  . Asthma 2013  . 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 CHF (congestive heart failure) ([place])   . COPD (chronic obstructive pulmonary disease) ([place])   . Diverticulitis 2010  . DVT (deep venous thrombosis) ([place])   . Endometriosis 1990  . Heart disease 2013  . Hx MRSA infection   . Iron deficiency   . Lump or mass in breast    . Restless leg     Patient Active Problem List   Diagnosis Date Noted  . Iron deficiency anemia 03/27/2018  . Anemia 03/26/2018  . Abdominal pain 02/20/2018  . Unstable angina ([place])   . Encounter for anticoagulation discussion and counseling   . C. difficile colitis 01/14/2018  . GIB (gastrointestinal bleeding) 01/08/2018  . SOB (shortness of breath) 11/01/2017  . Influenza A 10/29/2017  . Acute respiratory failure ([place]) 09/24/2017  . Sepsis ([place]) 07/03/2017  . COPD (chronic obstructive pulmonary disease) ([place]) 04/26/2017  . Community acquired pneumonia 04/25/2017  . Chronic systolic heart failure ([place]) 02/03/2017  . Hypotension 02/03/2017  . Chest pain 12/09/2016  . RLS (restless legs syndrome) 12/09/2016  . Cough, persistent 11/22/2015  . Hypokalemia 11/22/2015  . Leukocytosis 11/22/2015  . Dyspnea 11/21/2015  . Respiratory distress 09/19/2015  . Breast pain 01/11/2013  . Hx MRSA infection   . Lump or mass in breast   . GOITER, MULTINODULAR 01/20/2009  . HYPOGLYCEMIA, UNSPECIFIED 01/20/2009  . GERD 01/20/2009  . DIVERTICULITIS OF COLON 01/20/2009    Past Surgical History:  Procedure Laterality Date  . ABDOMINAL HYSTERECTOMY     age 3  . BREAST BIOPSY Right 2014   benign  . CARDIAC DEFIBRILLATOR PLACEMENT    . CARDIAC DEFIBRILLATOR PLACEMENT    . CESAREAN SECTION    . CHOLECYSTECTOMY    . COLECTOMY    . COLONOSCOPY WITH PROPOFOL N/A  02/22/2018   Procedure: COLONOSCOPY WITH PROPOFOL;  Surgeon: Toledo, Benay Pike, MD;  Location: [facility] ENDOSCOPY;  Service: Gastroenterology;  Laterality: N/A;  . ESOPHAGOGASTRODUODENOSCOPY (EGD) WITH PROPOFOL N/A 02/22/2018   Procedure: ESOPHAGOGASTRODUODENOSCOPY (EGD) WITH PROPOFOL;  Surgeon: Toledo, Benay Pike, MD;  Location: [facility] ENDOSCOPY;  Service: Gastroenterology;  Laterality: N/A;  . NASAL SINUS SURGERY  2012  . OOPHORECTOMY      Prior to Admission medications   Medication Sig Start Date End Date Taking? Authorizing Provider   albuterol (ACCUNEB) 1.25 MG/3ML nebulizer solution Take 3 mLs by nebulization every 6 (six) hours as needed for wheezing. 11/09/17  Yes [provider]  baclofen (LIORESAL) 10 MG tablet Take 10 mg by mouth 2 (two) times daily. 06/11/18  Yes [provider]  beclomethasone (QVAR) 80 MCG/ACT inhaler Inhale 2 puffs into the lungs 2 (two) times daily. 06/05/17 10/03/18 Yes Laverle Hobby, MD  busPIRone (BUSPAR) 15 MG tablet Take 15 mg by mouth 2 (two) times daily. 05/08/18  Yes [provider]  cyanocobalamin 500 MCG tablet Take 500 mcg by mouth daily.   Yes [provider]  furosemide (LASIX) 40 MG tablet Take 40 mg by mouth 2 (two) times daily. 07/09/18  Yes [provider]  levothyroxine (SYNTHROID, LEVOTHROID) 150 MCG tablet Take 150 mcg by mouth daily before breakfast.   Yes [provider]  losartan (COZAAR) 25 MG tablet Take 25 mg by mouth daily.    Yes [provider]  meloxicam (MOBIC) 15 MG tablet Take 15 mg by mouth daily. 08/30/18  Yes [provider]  nitroGLYCERIN (NITROSTAT) 0.4 MG SL tablet Place 1 tablet (0.4 mg total) under the tongue every 5 (five) minutes as needed for chest pain. 01/24/17  Yes Sainani, Belia Heman, MD  potassium chloride SA (KLOR-CON M20) 20 MEQ tablet Take 1 tablet (20 mEq total) by mouth 2 (two) times daily for 7 days. Patient taking differently: Take 40 mEq by mouth daily as needed. 1 tablet twice a day 06/17/18 10/03/18 Yes Hinda Kehr, MD  rOPINIRole (REQUIP) 2 MG tablet Take 2 tablets (4 mg total) by mouth at bedtime. 2-4  Mg per night Patient taking differently: Take 4 mg by mouth at bedtime.  01/16/18  Yes Wieting, Richard, MD  spironolactone (ALDACTONE) 25 MG tablet Take 25 mg by mouth 2 (two) times daily.    Yes [provider]  traMADol (ULTRAM) 50 MG tablet Take 50 mg by mouth every 6 (six) hours as needed.   Yes [provider]  Venlafaxine HCl 37.5 MG TB24 Take 37.5 mg  by mouth daily. 08/15/18  Yes [provider]    Allergies Contrast media [iodinated diagnostic agents]; Metrizamide; Penicillins; Isosorbide nitrate; Latex; Ondansetron; Zofran [ondansetron hcl]; Povidone-iodine; and Pulmicort [budesonide]  Family History  Problem Relation Age of Onset  . Cancer Mother 30       ovarian  . CAD Mother   . Cancer Father 73       brain  . CAD Father   . Cancer Daughter 18       skin  . Cancer Maternal Aunt 63       breast  . Leukemia Paternal Grandfather     Social History Social History   Tobacco Use  . Smoking status: Never Smoker  . Smokeless tobacco: Never Used  Substance Use Topics  . Alcohol use: No  . Drug use: No    Review of Systems  Constitutional: No fever. Eyes: No redness. ENT: No sore  throat. Cardiovascular: Denies chest pain. Respiratory: Denies shortness of breath. Gastrointestinal: Positive for nausea.  Genitourinary: Negative for flank pain.  Musculoskeletal: Negative for back pain. Skin: Negative for rash. Neurological: Negative for headache.   ____________________________________________   PHYSICAL EXAM:  VITAL SIGNS: ED Triage Vitals  Enc Vitals Group     BP 10/03/18 1510 121/60     Pulse Rate 10/03/18 1509 (!) 104     Resp 10/03/18 1509 20     Temp 10/03/18 1509 98.5 F (36.9 C)     Temp Source 10/03/18 1509 Oral     SpO2 10/03/18 1509 99 %     Weight 10/03/18 1513 230 lb (104.3 kg)     Height 10/03/18 1513 5\' 5"  (1.651 m)     Head Circumference --      Peak Flow --      Pain Score 10/03/18 1513 10     Pain Loc --      Pain Edu? --      Excl. in [place]? --     Constitutional: Alert and oriented.  Tearful and uncomfortable appearing but in no acute distress. Eyes: Conjunctivae are normal.  No scleral icterus. Head: Atraumatic. Nose: No congestion/rhinnorhea. Mouth/Throat: Mucous membranes are slightly dry.   Neck: Normal range of motion.  Cardiovascular: Good peripheral  circulation. Respiratory: Normal respiratory effort.   Gastrointestinal: Soft with some epigastric tenderness.  No distention.  Genitourinary: No flank tenderness. Musculoskeletal:  Extremities warm and well perfused.  Neurologic:  Normal speech and language. No gross focal neurologic deficits are appreciated.  Skin:  Skin is warm and dry. No rash noted. Psychiatric: Speech and behavior are normal.  ____________________________________________   LABS (all labs ordered are listed, but only abnormal results are displayed)  Labs Reviewed  BASIC METABOLIC PANEL - Abnormal; Notable for the following components:      Result Value   Potassium 3.2 (*)    Chloride 96 (*)    Glucose, Bld 109 (*)    All other components within normal limits  CBC WITH DIFFERENTIAL/PLATELET - Abnormal; Notable for the following components:   Hemoglobin 15.4 (*)    All other components within normal limits  HEPATIC FUNCTION PANEL - Abnormal; Notable for the following components:   Total Protein 8.4 (*)    All other components within normal limits  LIPASE, BLOOD   ____________________________________________  EKG   ____________________________________________  RADIOLOGY    ____________________________________________   PROCEDURES  Procedure(s) performed: No  Procedures  Critical Care performed: No ____________________________________________   INITIAL IMPRESSION / ASSESSMENT AND PLAN / ED COURSE  Pertinent labs & imaging results that were available during my care of the patient were reviewed by me and considered in my medical decision making (see chart for details).  53 year old female with PMH as noted above including chronic atrophic gastritis and recent diagnosis of gastric carcinoid tumor presents with persistent upper abdominal pain and states that she has been unable to obtain prescriptions for pain medication from any of her outpatient physicians.  I reviewed the past medical  records in epic.  The patient was seen by Dr. Janese Banks from oncology on 09/23/2018.  The patient was referred to a surgeon.  She was seen in the ED the same day and received a small prescription for Percocet.  The patient was then seen in the ED last night.  She was given referral to pain management, but this also will not be for a few weeks.  On exam today the vital signs  are normal.  The patient is tearful and uncomfortable but otherwise appears relatively well.  The abdomen is soft with some epigastric tenderness.  The remainder of the exam is as described above.   The patient states the pain is unchanged in location, quality, and intensity.  It is consistent with the pain that she has had from this tumor over the last several weeks.  Based on this, there is no indication for repeat imaging today.  I will obtain labs since the patient reports decreased p.o. intake as well as some diarrhea.  I will give IV morphine for pain control which worked well yesterday.  Since I cannot prescribe an extended course of Percocet, the patient has been unable to obtain adequate pain control from her outpatient specialist, and she states that the Percocet she was given only helped marginally, she will likely need admission for pain control.  ----------------------------------------- 8:09 PM on 10/03/2018 ----------------------------------------- Lab work-up is unremarkable.  The patient is getting some relief with the morphine.  I signed the patient out to the hospitalist Dr. Anselm Jungling for admission at approximately 8 PM.  ____________________________________________   FINAL CLINICAL IMPRESSION(S) / ED DIAGNOSES  Final diagnoses:  Epigastric pain  Carcinoid tumor of stomach, unspecified whether malignant      NEW MEDICATIONS STARTED DURING THIS VISIT:  New Prescriptions   No medications on file     Note:  This document was prepared using Dragon voice recognition software and may include unintentional  dictation errors.    Arta Silence, MD 10/03/18 2009

## 2018-10-03 NOTE — ED Notes (Signed)
ED TO INPATIENT HANDOFF REPORT  Name/Age/Gender Brittany Nguyen 53 y.o. female  Code Status Code Status History    Date Active Date Inactive Code Status Order ID Comments User Context   02/20/2018 2355 02/23/2018 1702 Full Code [id number]  Lance Coon, MD Inpatient   01/30/2018 0145 02/01/2018 1608 Full Code [id number]  Arta Silence, MD Inpatient   01/30/2018 0145 01/30/2018 0145 Full Code [id number]  Arta Silence, MD Inpatient   01/14/2018 1553 01/16/2018 1636 Full Code [id number]  Bettey Costa, MD Inpatient   01/08/2018 2351 01/11/2018 2010 Full Code [id number]  Amelia Jo, MD ED   10/30/2017 0113 11/01/2017 1421 Full Code [id number]  Lance Coon, MD ED   09/24/2017 0848 09/27/2017 1409 Full Code [id number]  Loletha Grayer, MD ED   07/03/2017 0654 07/05/2017 1248 Full Code [id number]  Saundra Shelling, MD ED   04/25/2017 0146 04/26/2017 1516 Full Code [id number]  Hugelmeyer, Alexis, DO ED   01/28/2017 1033 01/30/2017 1741 Full Code [id number]  [name], MD Inpatient   01/22/2017 0114 01/24/2017 1759 Full Code [id number]  Lance Coon, MD Inpatient   12/09/2016 0209 12/09/2016 1507 Full Code [id number]  Lance Coon, MD Inpatient   11/21/2015 0650 11/22/2015 1550 Full Code [id number]  Saundra Shelling, MD Inpatient   09/19/2015 0511 09/21/2015 1829 Full Code [id number]  Harrie Foreman, MD Inpatient      Home/SNF/Other Home  Chief Complaint abd pain rectal bleeding vomiting  Level of Care/Admitting Diagnosis ED Disposition    ED Disposition Condition [place]: [place] [100120]  Level of Care: Med-Surg [16]  Diagnosis: Abdominal pain [480165]  Admitting Physician: Vaughan Basta [[id number]]  Attending Physician: Vaughan Basta [phone]  Estimated length of stay: past midnight tomorrow  Certification:: I certify this patient will need inpatient services for at least 2 midnights  PT Class (Do Not Modify): Inpatient [101]  PT Acc  Code (Do Not Modify): Private [1]       Medical History Past Medical History:  Diagnosis Date  . Asthma 2013  . 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 CHF (congestive heart failure) ([place])   . COPD (chronic obstructive pulmonary disease) ([place])   . Diverticulitis 2010  . DVT (deep venous thrombosis) ([place])   . Endometriosis 1990  . Heart disease 2013  . Hx MRSA infection   . Iron deficiency   . Lump or mass in breast   . Restless leg     Allergies Allergies  Allergen Reactions  . Contrast Media [Iodinated Diagnostic Agents] Shortness Of Breath    Per CT report in 2014 pt had break through contrast reaction with hives and SOB following 13 hour prep. MSY   . Metrizamide Shortness Of Breath  . Penicillins Hives and Other (See Comments)    Other reaction(s): Other (See Comments) Has patient had a PCN reaction causing immediate rash, facial/tongue/throat swelling, SOB or lightheadedness with hypotension: Yes Has patient had a PCN reaction causing severe rash involving mucus membranes or skin necrosis: No Has patient had a PCN reaction that required hospitalization No Has patient had a PCN reaction occurring within the last 10 years: No If all of the above answers are "NO", then may proceed with Cephalosporin use. Has patient had a PCN reaction causing immediate rash, facial/tongue/throat swelling, SOB or lightheadedness with hypotension: Yes Has patient had a PCN reaction causing severe rash involving mucus membranes or skin necrosis: No Has patient had a PCN reaction that required hospitalization  No Has patient had a PCN reaction occurring within the last 10 years: No If all of the above answers are "NO", then may proceed with Cephalosporin use.   . Isosorbide Nitrate     Other reaction(s): Headache  . Latex Hives  . Ondansetron Other (See Comments)    Severe headache  . Zofran [Ondansetron Hcl] Other (See Comments)    Causes migraines  . Povidone-Iodine Rash     Blistering rash  . Pulmicort [Budesonide] Itching    IV Location/Drains/Wounds Patient Lines/Drains/Airways Status   Active Line/Drains/Airways    Name:   Placement date:   Placement time:   Site:   Days:   Peripheral IV 10/03/18 Left Antecubital   10/03/18    1807    Antecubital   less than 1          Labs/Imaging Results for orders placed or performed during the hospital encounter of 10/03/18 (from the past 48 hour(s))  Basic metabolic panel     Status: Abnormal   Collection Time: 10/03/18  6:06 PM  Result Value Ref Range   Sodium 138 135 - 145 mmol/L   Potassium 3.2 (L) 3.5 - 5.1 mmol/L   Chloride 96 (L) 98 - 111 mmol/L   CO2 31 22 - 32 mmol/L   Glucose, Bld 109 (H) 70 - 99 mg/dL   BUN 13 6 - 20 mg/dL   Creatinine, Ser 0.96 0.44 - 1.00 mg/dL   Calcium 8.9 8.9 - 10.3 mg/dL   GFR calc non Af Amer >60 >60 mL/min   GFR calc Af Amer >60 >60 mL/min   Anion gap 11 5 - 15    Comment: Performed at [facility], [address] [place], [place] [address]  CBC with Differential     Status: Abnormal   Collection Time: 10/03/18  6:06 PM  Result Value Ref Range   WBC 9.6 4.0 - 10.5 K/uL   RBC 4.97 3.87 - 5.11 MIL/uL   Hemoglobin 15.4 (H) 12.0 - 15.0 g/dL   HCT 44.6 36.0 - 46.0 %   MCV 89.7 80.0 - 100.0 fL   MCH 31.0 26.0 - 34.0 pg   MCHC 34.5 30.0 - 36.0 g/dL   RDW 13.2 11.5 - 15.5 %   Platelets 263 150 - 400 K/uL   nRBC 0.0 0.0 - 0.2 %   Neutrophils Relative % 62 %   Neutro Abs 6.0 1.7 - 7.7 K/uL   Lymphocytes Relative 27 %   Lymphs Abs 2.6 0.7 - 4.0 K/uL   Monocytes Relative 7 %   Monocytes Absolute 0.7 0.1 - 1.0 K/uL   Eosinophils Relative 2 %   Eosinophils Absolute 0.2 0.0 - 0.5 K/uL   Basophils Relative 1 %   Basophils Absolute 0.1 0.0 - 0.1 K/uL   Immature Granulocytes 1 %   Abs Immature Granulocytes 0.06 0.00 - 0.07 K/uL    Comment: Performed at [facility], [address] [place], [place] [address]  Hepatic function panel     Status:  Abnormal   Collection Time: 10/03/18  6:06 PM  Result Value Ref Range   Total Protein 8.4 (H) 6.5 - 8.1 g/dL   Albumin 4.6 3.5 - 5.0 g/dL   AST 28 15 - 41 U/L   ALT 35 0 - 44 U/L   Alkaline Phosphatase 100 38 - 126 U/L   Total Bilirubin 0.8 0.3 - 1.2 mg/dL   Bilirubin, Direct 0.1 0.0 - 0.2 mg/dL   Indirect Bilirubin 0.7  0.3 - 0.9 mg/dL    Comment: Performed at [facility], [address] [place], [place] [address]  Lipase, blood     Status: None   Collection Time: 10/03/18  6:06 PM  Result Value Ref Range   Lipase 29 11 - 51 U/L    Comment: Performed at [facility], [address] [place], [place] [address]   No results found.  Pending Labs [employer] (From admission, onward)    Start     Ordered   Signed and Occupational hygienist morning,   R     Signed and Held   Signed and Held  CBC  Tomorrow morning,   R     Signed and Held   Signed and Held  CBC  (heparin)  Once,   R    Comments:  Baseline for heparin therapy IF NOT ALREADY DRAWN.  Notify MD if PLT < 100 K.    Signed and Held   Signed and Held  Creatinine, serum  (heparin)  Once,   R    Comments:  Baseline for heparin therapy IF NOT ALREADY DRAWN.    Signed and Held          Vitals/Pain Today's Vitals   10/03/18 1639 10/03/18 2026 10/03/18 2150 10/03/18 2150  BP: 107/73   97/61  Pulse: 100   (!) 102  Resp: 16   16  Temp:      TempSrc:      SpO2: 97%   98%  Weight:      Height:      PainSc:  10-Worst pain ever 8      Isolation Precautions No active isolations  Medications Medications  oxyCODONE-acetaminophen (PERCOCET/ROXICET) 5-325 MG per tablet 1-2 tablet (has no administration in time range)  morphine 2 MG/ML injection 2 mg (has no administration in time range)  potassium chloride SA (K-DUR,KLOR-CON) CR tablet 20 mEq (has no administration in time range)  fentaNYL (DURAGESIC) 50 MCG/HR 1 patch (has no administration in time range)  ondansetron (ZOFRAN)  injection 4 mg (has no administration in time range)  diphenhydrAMINE (BENADRYL) injection 12.5 mg (12.5 mg Intravenous Given 10/03/18 2116)  potassium chloride SA (K-DUR,KLOR-CON) CR tablet 20 mEq (has no administration in time range)  morphine 4 MG/ML injection 4 mg (4 mg Intravenous Given 10/03/18 1808)  sodium chloride 0.9 % bolus 1,000 mL (0 mLs Intravenous Stopped 10/03/18 1950)  metoCLOPramide (REGLAN) injection 10 mg (10 mg Intravenous Given 10/03/18 1808)  oxyCODONE (Oxy IR/ROXICODONE) immediate release tablet 5 mg (5 mg Oral Given 10/03/18 2045)    Mobility walks

## 2018-10-03 NOTE — ED Notes (Signed)
PT reporting generalized itching since having taken the oxycodone. NO SOB, throat swelling, rash or acute distress noted. Admitting MD made aware.

## 2018-10-03 NOTE — ED Notes (Signed)
Admitting MD at bedside.

## 2018-10-03 NOTE — Progress Notes (Signed)
   10/03/18 2140  Clinical Encounter Type  Visited With Patient  Visit Type Spiritual support  Spiritual Encounters  Spiritual Needs Prayer  Stress Factors  Patient Stress Factors Health changes    Chaplain encountered the patient while rounding the ED. Patient had been waiting in the bed in the hallway since this afternoon while tests are being run. She indicates that she's feeling better than when she arrived in the ED this morning. The patient said that tests are being run to determine the source of her stomach pain. She is concerned about the tumors that have been found and the unknown road ahead. Patient indicates a strong support system with a husband, two daughters, and two granddaughters, whom she loves. Patient told stories of her granddaughters as well as recent pictures. She is a proud mother and grandmother. Chaplain provided encouragement, emotional support and active listening. Patient states that she was grateful that I finally stopped by. She requested prayer for her health and the strength of her family for the road ahead.

## 2018-10-03 NOTE — H&P (Signed)
[facility] NAME: Bradleigh Sonnen    MR#:  [id number]  DATE OF BIRTH:  07-Jun-1966  DATE OF ADMISSION:  10/03/2018  PRIMARY CARE PHYSICIAN: [name], MD   REQUESTING/REFERRING PHYSICIAN: Siadecki  CHIEF COMPLAINT:   Chief Complaint  Patient presents with  . Abdominal Pain    HISTORY OF PRESENT ILLNESS: Brittany Nguyen  is a 53 y.o. female with a known history of asthma, carcinoid tumor of stomach, CHF, COPD, status post AICD, DVT, endometriosis, iron deficiency, restless leg-diagnosed with stomach cancer last month.  Seen by oncology clinic.  Referred to [facility] for partial gastrectomy surgery due to her cardiac issues.  Her surgery scheduled on 14 February.  She had been having significant pain in her stomach because of the cancer and she had contacted her GI ( Dr.Toledo) and oncology clinics and they have suggested she should contact [facility] surgical team for the pain management and gave her some oxycodone prescriptions for few days. She ran out of oxycodone within few days and continue to have significant pain.  She also came to emergency room yesterday where she was given morphine injection and started on fentanyl patch and sent home. Her pain continue to be significant and did not get any relief so came back to emergency room today.  ER physician suggested to admit to hospitalist team to achieve proper pain control with oral and topical pain medication.  PAST MEDICAL HISTORY:   Past Medical History:  Diagnosis Date  . Asthma 2013  . 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 CHF (congestive heart failure) ([place])   . COPD (chronic obstructive pulmonary disease) ([place])   . Diverticulitis 2010  . DVT (deep venous thrombosis) ([place])   . Endometriosis 1990  . Heart disease 2013  . Hx MRSA infection   . Iron deficiency   . Lump or mass in breast   . Restless leg     PAST SURGICAL HISTORY:  Past Surgical History:  Procedure  Laterality Date  . ABDOMINAL HYSTERECTOMY     age 35  . BREAST BIOPSY Right 2014   benign  . CARDIAC DEFIBRILLATOR PLACEMENT    . CARDIAC DEFIBRILLATOR PLACEMENT    . CESAREAN SECTION    . CHOLECYSTECTOMY    . COLECTOMY    . COLONOSCOPY WITH PROPOFOL N/A 02/22/2018   Procedure: COLONOSCOPY WITH PROPOFOL;  Surgeon: Toledo, Benay Pike, MD;  Location: [facility] ENDOSCOPY;  Service: Gastroenterology;  Laterality: N/A;  . ESOPHAGOGASTRODUODENOSCOPY (EGD) WITH PROPOFOL N/A 02/22/2018   Procedure: ESOPHAGOGASTRODUODENOSCOPY (EGD) WITH PROPOFOL;  Surgeon: Toledo, Benay Pike, MD;  Location: [facility] ENDOSCOPY;  Service: Gastroenterology;  Laterality: N/A;  . NASAL SINUS SURGERY  2012  . OOPHORECTOMY      SOCIAL HISTORY:  Social History   Tobacco Use  . Smoking status: Never Smoker  . Smokeless tobacco: Never Used  Substance Use Topics  . Alcohol use: No    FAMILY HISTORY:  Family History  Problem Relation Age of Onset  . Cancer Mother 30       ovarian  . CAD Mother   . Cancer Father 82       brain  . CAD Father   . Cancer Daughter 18       skin  . Cancer Maternal Aunt 34       breast  . Leukemia Paternal Grandfather     DRUG ALLERGIES:  Allergies  Allergen Reactions  . Contrast Media [Iodinated Diagnostic  Agents] Shortness Of Breath    Per CT report in 2014 pt had break through contrast reaction with hives and SOB following 13 hour prep. MSY   . Metrizamide Shortness Of Breath  . Penicillins Hives and Other (See Comments)    Other reaction(s): Other (See Comments) Has patient had a PCN reaction causing immediate rash, facial/tongue/throat swelling, SOB or lightheadedness with hypotension: Yes Has patient had a PCN reaction causing severe rash involving mucus membranes or skin necrosis: No Has patient had a PCN reaction that required hospitalization No Has patient had a PCN reaction occurring within the last 10 years: No If all of the above answers are "NO", then may proceed with  Cephalosporin use. Has patient had a PCN reaction causing immediate rash, facial/tongue/throat swelling, SOB or lightheadedness with hypotension: Yes Has patient had a PCN reaction causing severe rash involving mucus membranes or skin necrosis: No Has patient had a PCN reaction that required hospitalization No Has patient had a PCN reaction occurring within the last 10 years: No If all of the above answers are "NO", then may proceed with Cephalosporin use.   . Isosorbide Nitrate     Other reaction(s): Headache  . Latex Hives  . Ondansetron Other (See Comments)    Severe headache  . Zofran [Ondansetron Hcl] Other (See Comments)    Causes migraines  . Povidone-Iodine Rash    Blistering rash  . Pulmicort [Budesonide] Itching    REVIEW OF SYSTEMS:   CONSTITUTIONAL: No fever, fatigue or weakness.  EYES: No blurred or double vision.  EARS, NOSE, AND THROAT: No tinnitus or ear pain.  RESPIRATORY: No cough, shortness of breath, wheezing or hemoptysis.  CARDIOVASCULAR: No chest pain, orthopnea, edema.  GASTROINTESTINAL: No nausea, vomiting, diarrhea, have significant abdominal pain.  GENITOURINARY: No dysuria, hematuria.  ENDOCRINE: No polyuria, nocturia,  HEMATOLOGY: No anemia, easy bruising or bleeding SKIN: No rash or lesion. MUSCULOSKELETAL: No joint pain or arthritis.   NEUROLOGIC: No tingling, numbness, weakness.  PSYCHIATRY: No anxiety or depression.   MEDICATIONS AT HOME:  Prior to Admission medications   Medication Sig Start Date End Date Taking? Authorizing Provider  albuterol (ACCUNEB) 1.25 MG/3ML nebulizer solution Take 3 mLs by nebulization every 6 (six) hours as needed for wheezing. 11/09/17  Yes [provider]  baclofen (LIORESAL) 10 MG tablet Take 10 mg by mouth 2 (two) times daily. 06/11/18  Yes [provider]  beclomethasone (QVAR) 80 MCG/ACT inhaler Inhale 2 puffs into the lungs 2 (two) times daily. 06/05/17 10/03/18 Yes Laverle Hobby, MD   busPIRone (BUSPAR) 15 MG tablet Take 15 mg by mouth 2 (two) times daily. 05/08/18  Yes [provider]  cyanocobalamin 500 MCG tablet Take 500 mcg by mouth daily.   Yes [provider]  furosemide (LASIX) 40 MG tablet Take 40 mg by mouth 2 (two) times daily. 07/09/18  Yes [provider]  levothyroxine (SYNTHROID, LEVOTHROID) 150 MCG tablet Take 150 mcg by mouth daily before breakfast.   Yes [provider]  losartan (COZAAR) 25 MG tablet Take 25 mg by mouth daily.    Yes [provider]  meloxicam (MOBIC) 15 MG tablet Take 15 mg by mouth daily. 08/30/18  Yes [provider]  nitroGLYCERIN (NITROSTAT) 0.4 MG SL tablet Place 1 tablet (0.4 mg total) under the tongue every 5 (five) minutes as needed for chest pain. 01/24/17  Yes Sainani, Belia Heman, MD  potassium chloride SA (KLOR-CON M20) 20 MEQ tablet Take 1 tablet (20 mEq total)  by mouth 2 (two) times daily for 7 days. Patient taking differently: Take 40 mEq by mouth daily as needed. 1 tablet twice a day 06/17/18 10/03/18 Yes Hinda Kehr, MD  rOPINIRole (REQUIP) 2 MG tablet Take 2 tablets (4 mg total) by mouth at bedtime. 2-4  Mg per night Patient taking differently: Take 4 mg by mouth at bedtime.  01/16/18  Yes Wieting, Richard, MD  spironolactone (ALDACTONE) 25 MG tablet Take 25 mg by mouth 2 (two) times daily.    Yes [provider]  traMADol (ULTRAM) 50 MG tablet Take 50 mg by mouth every 6 (six) hours as needed.   Yes [provider]  Venlafaxine HCl 37.5 MG TB24 Take 37.5 mg by mouth daily. 08/15/18  Yes [provider]      PHYSICAL EXAMINATION:   VITAL SIGNS: Blood pressure 107/73, pulse 100, temperature 98.5 F (36.9 C), temperature source Oral, resp. rate 16, height 5\' 5"  (1.651 m), weight 104.3 kg, last menstrual period 01/22/2012, SpO2 97 %.  GENERAL:  53 y.o.-year-old patient lying in the bed with no acute distress.  EYES: Pupils equal, round, reactive to  light and accommodation. No scleral icterus. Extraocular muscles intact.  HEENT: Head atraumatic, normocephalic. Oropharynx and nasopharynx clear.  NECK:  Supple, no jugular venous distention. No thyroid enlargement, no tenderness.  LUNGS: Normal breath sounds bilaterally, no wheezing, rales,rhonchi or crepitation. No use of accessory muscles of respiration.  CARDIOVASCULAR: S1, S2 normal. No murmurs, rubs, or gallops.  ABDOMEN: Soft, tender, nondistended. Bowel sounds present. No organomegaly or mass.  EXTREMITIES: No pedal edema, cyanosis, or clubbing.  NEUROLOGIC: Cranial nerves II through XII are intact. Muscle strength 5/5 in all extremities. Sensation intact. Gait not checked.  PSYCHIATRIC: The patient is alert and oriented x 3.  SKIN: No obvious rash, lesion, or ulcer.   LABORATORY PANEL:   CBC Recent Labs  Lab 10/02/18 0147 10/03/18 1806  WBC 8.9 9.6  HGB 13.8 15.4*  HCT 39.5 44.6  PLT 241 263  MCV 88.4 89.7  MCH 30.9 31.0  MCHC 34.9 34.5  RDW 13.2 13.2  LYMPHSABS 2.8 2.6  MONOABS 0.7 0.7  EOSABS 0.2 0.2  BASOSABS 0.0 0.1   ------------------------------------------------------------------------------------------------------------------  Chemistries  Recent Labs  Lab 10/02/18 0147 10/03/18 1806  NA 136 138  K 2.9* 3.2*  CL 99 96*  CO2 29 31  GLUCOSE 117* 109*  BUN 16 13  CREATININE 0.93 0.96  CALCIUM 8.3* 8.9  AST 21 28  ALT 23 35  ALKPHOS 85 100  BILITOT 0.8 0.8   ------------------------------------------------------------------------------------------------------------------ estimated creatinine clearance is 84 mL/min (by C-G formula based on SCr of 0.96 mg/dL). ------------------------------------------------------------------------------------------------------------------ No results for input(s): TSH, T4TOTAL, T3FREE, THYROIDAB in the last 72 hours.  Invalid input(s): FREET3   Coagulation profile No results for input(s): INR, PROTIME in the  last 168 hours. ------------------------------------------------------------------------------------------------------------------- No results for input(s): DDIMER in the last 72 hours. -------------------------------------------------------------------------------------------------------------------  Cardiac Enzymes No results for input(s): CKMB, TROPONINI, MYOGLOBIN in the last 168 hours.  Invalid input(s): CK ------------------------------------------------------------------------------------------------------------------ Invalid input(s): POCBNP  ---------------------------------------------------------------------------------------------------------------  Urinalysis    Component Value Date/Time   COLORURINE STRAW (A) 06/22/2018 2241   APPEARANCEUR CLEAR (A) 06/22/2018 2241   APPEARANCEUR Clear 09/02/2014 2312   LABSPEC 1.008 06/22/2018 2241   LABSPEC 1.013 09/02/2014 2312   PHURINE 5.0 06/22/2018 2241   GLUCOSEU NEGATIVE 06/22/2018 2241   GLUCOSEU Negative 09/02/2014 2312   HGBUR NEGATIVE 06/22/2018 2241   BILIRUBINUR NEGATIVE 06/22/2018 2241   BILIRUBINUR  Negative 09/02/2014 [place] 06/22/2018 [place] 06/22/2018 2241   NITRITE NEGATIVE 06/22/2018 [place] 06/22/2018 2241   LEUKOCYTESUR Negative 09/02/2014 2312     RADIOLOGY: No results found.  EKG: Orders placed or performed during the hospital encounter of 09/23/18  . ED EKG  . ED EKG  . EKG 12-Lead  . EKG 12-Lead  . EKG 12-Lead  . EKG 12-Lead    IMPRESSION AND PLAN:  * Intractable abdominal pain Fentanyl 50 mcg patch was placed yesterday. I will add oxycodone 5 to 10 mg oral tablets every 4 hours as needed. If this regimen is not able to control her pain then would also give morphine injections while in the hospital and trying to establish proper oral pain medication regimen. At discharge she may need to have prescriptions for pain medication  enough for 15-17 days, so she can have her surgery as scheduled.  *Hypokalemia Replace oral.  *Hypertension Continue home medications.  *Hypothyroidism Continue levothyroxine.  *Carcinoid of stomach Follow-up with cancer center in [place] surgery as planned.  All the records are reviewed and case discussed with ED provider. Management plans discussed with the patient, family and they are in agreement.  CODE STATUS: Full code. Code Status History    Date Active Date Inactive Code Status Order ID Comments User Context   02/20/2018 2355 02/23/2018 1702 Full Code [id number]  Lance Coon, MD Inpatient   01/30/2018 0145 02/01/2018 1608 Full Code [id number]  Arta Silence, MD Inpatient   01/30/2018 0145 01/30/2018 0145 Full Code [id number]  Arta Silence, MD Inpatient   01/14/2018 1553 01/16/2018 1636 Full Code [id number]  Bettey Costa, MD Inpatient   01/08/2018 2351 01/11/2018 2010 Full Code [id number]  Amelia Jo, MD ED   10/30/2017 0113 11/01/2017 1421 Full Code [id number]  Lance Coon, MD ED   09/24/2017 0848 09/27/2017 1409 Full Code [id number]  Loletha Grayer, MD ED   07/03/2017 0654 07/05/2017 1248 Full Code [id number]  Saundra Shelling, MD ED   04/25/2017 0146 04/26/2017 1516 Full Code [id number]  [place], [place], DO ED   01/28/2017 1033 01/30/2017 1741 Full Code [id number]  [name], MD Inpatient   01/22/2017 0114 01/24/2017 1759 Full Code [id number]  Lance Coon, MD Inpatient   12/09/2016 0209 12/09/2016 1507 Full Code [id number]  Lance Coon, MD Inpatient   11/21/2015 0650 11/22/2015 1550 Full Code [id number]  Saundra Shelling, MD Inpatient   09/19/2015 0511 09/21/2015 1829 Full Code [id number]  Harrie Foreman, MD Inpatient       TOTAL TIME TAKING CARE OF THIS PATIENT: 45 minutes.    Vaughan Basta M.D on 10/03/2018   Between 7am to 6pm - Pager - [phone]  After 6pm go to [email or web address] - password [email or web address] [facility]  Office  [phone]  CC: Primary  care physician; [name], MD   Note: This dictation was prepared with Dragon dictation along with smaller phrase technology. Any transcriptional errors that result from this process are unintentional.

## 2018-10-04 LAB — CBC
HCT: 41.7 % (ref 36.0–46.0)
Hemoglobin: 14.3 g/dL (ref 12.0–15.0)
MCH: 31.2 pg (ref 26.0–34.0)
MCHC: 34.3 g/dL (ref 30.0–36.0)
MCV: 90.8 fL (ref 80.0–100.0)
Platelets: 214 10*3/uL (ref 150–400)
RBC: 4.59 MIL/uL (ref 3.87–5.11)
RDW: 13.2 % (ref 11.5–15.5)
WBC: 6.3 10*3/uL (ref 4.0–10.5)
nRBC: 0 % (ref 0.0–0.2)

## 2018-10-04 LAB — PHOSPHORUS: Phosphorus: 4.8 mg/dL — ABNORMAL HIGH (ref 2.5–4.6)

## 2018-10-04 LAB — BASIC METABOLIC PANEL
Anion gap: 8 (ref 5–15)
BUN: 13 mg/dL (ref 6–20)
CHLORIDE: 100 mmol/L (ref 98–111)
CO2: 30 mmol/L (ref 22–32)
CREATININE: 0.89 mg/dL (ref 0.44–1.00)
Calcium: 8.4 mg/dL — ABNORMAL LOW (ref 8.9–10.3)
GFR calc Af Amer: >60 mL/min (ref >60)
GFR calc non Af Amer: >60 mL/min (ref >60)
Glucose, Bld: 115 mg/dL — ABNORMAL HIGH (ref 70–99)
Potassium: 3.1 mmol/L — ABNORMAL LOW (ref 3.5–5.1)
Sodium: 138 mmol/L (ref 135–145)

## 2018-10-04 LAB — MAGNESIUM: Magnesium: 2.2 mg/dL (ref 1.7–2.4)

## 2018-10-04 MED ORDER — PROMETHAZINE HCL 25 MG/ML IJ SOLN
25.0000 mg | Freq: Four times a day (QID) | INTRAMUSCULAR | Status: DC | PRN
  Administered 2018-10-04: 25 mg via INTRAVENOUS
  Filled 2018-10-04: qty 1

## 2018-10-04 MED ORDER — ADULT MULTIVITAMIN W/MINERALS CH
1.0000 | ORAL_TABLET | Freq: Every day | ORAL | Status: DC
  Administered 2018-10-05: 1 via ORAL
  Filled 2018-10-04: qty 1

## 2018-10-04 MED ORDER — PROMETHAZINE HCL 25 MG PO TABS
25.0000 mg | ORAL_TABLET | Freq: Four times a day (QID) | ORAL | Status: DC | PRN
  Administered 2018-10-04 – 2018-10-05 (×2): 25 mg via ORAL
  Filled 2018-10-04 (×2): qty 1

## 2018-10-04 MED ORDER — BUDESONIDE 0.5 MG/2ML IN SUSP: 0.5000 mg | Freq: Two times a day (BID) | RESPIRATORY_TRACT | Status: DC | PRN

## 2018-10-04 MED ORDER — ENSURE MAX PROTEIN PO LIQD
11.0000 [oz_av] | Freq: Two times a day (BID) | ORAL | Status: DC
  Administered 2018-10-04 – 2018-10-05 (×3): 11 [oz_av] via ORAL
  Filled 2018-10-04: qty 330

## 2018-10-04 NOTE — Progress Notes (Addendum)
Initial Nutrition Assessment  DOCUMENTATION CODES:   Obesity unspecified  INTERVENTION:   Ensure Max protein supplement BID, each supplement provides 150kcal and 30g of protein.  MVI daily  Liberalize diet- no salt packs on meal trays   Pt likely at refeed risk; recommend monitor K, Mg and P labs daily once oral intake improves.   NUTRITION DIAGNOSIS:   Inadequate oral intake related to altered GI function as evidenced by meal completion < 25%.  GOAL:   Patient will meet greater than or equal to 90% of their needs  MONITOR:   PO intake, Supplement acceptance, Labs, Weight trends, Skin, I & O's  REASON FOR ASSESSMENT:   Malnutrition Screening Tool    ASSESSMENT:   53 y.o. female with a known history of asthma, carcinoid tumor of stomach, CHF, COPD, status post AICD, DVT, endometriosis, iron deficiency, restless leg-diagnosed with stomach cancer last month.  Seen by oncology clinic.  Referred to [facility] for partial gastrectomy surgery due to her cardiac issues.  Her surgery scheduled on 14 February. Pt admitted with nausea, vomiting and abdominal pain    Met with pt in room today. RD familiar with this pt from multiple previous admits. Pt with gastric tumors and intermittent abdominal pain and nausea since June 2019. Pt reports poor oral intake for several days pta r/t nausea and vomiting. Pt did not eat her breakfast this morning. Pt does enjoy chocolate Boost and has been drinking these at home. Pt would like chocolate Ensure Max in hospital. Per chart, pt with some weight gain pta r/t CHF but appears fairly weight stable since June. RD will add supplements and MVI to help pt meet her estimated needs. Pt likely at refeed risk; recommend monitor K, Mg and P labs as oral intake improves. Will liberalize diet and restrict salt packs from trays. Of note, pt on meloxicam; unsure if this could possibly worsen gastritis. Spoke to pharmacy who will address this concern with MD.    Pt  scheduled for gastrectomy 2/14; discussed with pt the importance of daily MVI after procedure as she will be at increased risk for nutrient deficiencies.   Medications reviewed and include: fentanyl, heparin, synthroid, meloxicam, MVI, KCl, spironolactone, B12, morphine, zofran, phenergan   Labs reviewed: K 3.1(L), P 4.8(H), Mg 2.2 wnl Iron 66, TIBC 305, ferritin 107, B12 430  NUTRITION - FOCUSED PHYSICAL EXAM:    Most Recent Value  Orbital Region  No depletion  Upper Arm Region  No depletion  Thoracic and Lumbar Region  No depletion  Buccal Region  No depletion  Temple Region  No depletion  Clavicle Bone Region  No depletion  Clavicle and Acromion Bone Region  No depletion  Scapular Bone Region  No depletion  Dorsal Hand  No depletion  Patellar Region  No depletion  Anterior Thigh Region  No depletion  Posterior Calf Region  No depletion  Edema (RD Assessment)  None  Hair  Reviewed  Eyes  Reviewed  Mouth  Reviewed  Skin  Reviewed  Nails  Reviewed     Diet Order:   Diet Order            Diet regular Room service appropriate? Yes; Fluid consistency: Thin  Diet effective now             EDUCATION NEEDS:   Education needs have been addressed  Skin:  Skin Assessment: Reviewed RN Assessment  Last BM:  1/29  Height:   Ht Readings from Last 1 Encounters:  10/03/18 5' 5" (1.651 m)    Weight:   Wt Readings from Last 1 Encounters:  10/03/18 104.3 kg    Ideal Body Weight:  56.8 kg  BMI:  Body mass index is 38.27 kg/m.  Estimated Nutritional Needs:   Kcal:  1800-2100kcal/day   Protein:  105-115g/day   Fluid:  1.7L/day   Koleen Distance MS, RD, LDN Pager #- [phone] Office#- [phone] After Hours Pager: [phone]

## 2018-10-04 NOTE — Progress Notes (Signed)
   10/04/18 1500  Clinical Encounter Type  Visited With Patient  Visit Type Follow-up  Referral From Chaplain  Consult/Referral To [place] was rounding and stop to visit patient with a follow up. Chaplain introduced herself to the patient and told her that the other Chaplain thought she would love a follow up today. Patient said thank you for thinking of me. Chaplain ask patient how was she feeling today and patient express her level of pain. Chaplain gave pastoral comfort. Chaplain asked patient about her family because she shared with other Chaplain and she wanted to take her mind off the pain. The patient glowed greatly while taking about her daughters and grandchildren, she is very proud of them. Chaplain notice patient dosing so she ended visit and told patient she will continue to lift her in prayer and patient thanked Chaplain.

## 2018-10-04 NOTE — Plan of Care (Deleted)

## 2018-10-04 NOTE — Progress Notes (Signed)
Patient has received IV zofran for nausea/vomiting. Patient has Zofran listed as an allergy and patient tells me that she typically has to take phenergan for nausea/vomting. Spoke with MD and orders placed for Phenergan every six hours as needed.

## 2018-10-04 NOTE — Progress Notes (Signed)
Patient continues to complain of itching across abdomen and top of thighs after Percocet; no rash noted; Benadryl IVP given; Percocet effective for pain;will continue to monitor. Barbaraann Faster, RN 5:45 AM 10/04/2018

## 2018-10-04 NOTE — Progress Notes (Signed)
[place] [facility] [place] NAME: Brittany Nguyen    MR#:  [id number]  DATE OF BIRTH:  10-23-1965  SUBJECTIVE: Admitted for abdominal pain, for pain control.  History of carcinoid tumor of stomach, recently diagnosed with stomach cancer, supposed to have partial gastrectomy at [facility] on February 14 but got admitted because of severe abdominal pain and pain control.  Patient is on fentanyl patch 50 mcg every 72 hours, placed 2 days ago, on morphine IV, Percocet.  Will discontinue IV morphine, continue oral pain meds, fentanyl patch.  In preparation for possible discharge, patient scheduled to have PET scan tomorrow at 2 PM.  Patient is agreeable for this plan of trying oral pain meds, fentanyl patch, see if that works we can discharge home tomorrow with a prescription for fentanyl patch, Percocet.  CHIEF COMPLAINT:   Chief Complaint  Patient presents with  . Abdominal Pain    REVIEW OF SYSTEMS:   Review of Systems  Constitutional: Negative for chills and fever.  HENT: Negative for hearing loss.   Eyes: Negative for blurred vision, double vision and photophobia.  Respiratory: Negative for cough, hemoptysis and shortness of breath.   Cardiovascular: Negative for palpitations, orthopnea and leg swelling.  Gastrointestinal: Positive for abdominal pain and nausea. Negative for diarrhea and vomiting.  Genitourinary: Negative for dysuria and urgency.  Musculoskeletal: Negative for myalgias and neck pain.  Skin: Negative for rash.  Neurological: Negative for dizziness, focal weakness, seizures, weakness and headaches.  Psychiatric/Behavioral: Negative for memory loss. The patient does not have insomnia.      DRUG ALLERGIES:   Allergies  Allergen Reactions  . Contrast Media [Iodinated Diagnostic Agents] Shortness Of Breath    Per CT report in 2014 pt had break through contrast reaction with hives and SOB following 13 hour prep. MSY   . Metrizamide  Shortness Of Breath  . Penicillins Hives and Other (See Comments)    Other reaction(s): Other (See Comments) Has patient had a PCN reaction causing immediate rash, facial/tongue/throat swelling, SOB or lightheadedness with hypotension: Yes Has patient had a PCN reaction causing severe rash involving mucus membranes or skin necrosis: No Has patient had a PCN reaction that required hospitalization No Has patient had a PCN reaction occurring within the last 10 years: No If all of the above answers are "NO", then may proceed with Cephalosporin use. Has patient had a PCN reaction causing immediate rash, facial/tongue/throat swelling, SOB or lightheadedness with hypotension: Yes Has patient had a PCN reaction causing severe rash involving mucus membranes or skin necrosis: No Has patient had a PCN reaction that required hospitalization No Has patient had a PCN reaction occurring within the last 10 years: No If all of the above answers are "NO", then may proceed with Cephalosporin use.   . Isosorbide Nitrate     Other reaction(s): Headache  . Latex Hives  . Ondansetron Other (See Comments)    Severe headache  . Zofran [Ondansetron Hcl] Other (See Comments)    Causes migraines  . Povidone-Iodine Rash    Blistering rash  . Pulmicort [Budesonide] Itching    VITALS:  Blood pressure 105/67, pulse 89, temperature 98.1 F (36.7 C), temperature source Oral, resp. rate 20, height 5\' 5"  (1.651 m), weight 104.3 kg, last menstrual period 01/22/2012, SpO2 91 %.  PHYSICAL EXAMINATION:  GENERAL:  53 y.o.-year-old patient lying in the bed with no acute distress.  EYES: Pupils equal, round, reactive to light and accommodation. No scleral  icterus. Extraocular muscles intact.  HEENT: Head atraumatic, normocephalic. Oropharynx and nasopharynx clear.  NECK:  Supple, no jugular venous distention. No thyroid enlargement, no tenderness.  LUNGS: Normal breath sounds bilaterally, no wheezing, rales,rhonchi or  crepitation. No use of accessory muscles of respiration.  CARDIOVASCULAR: S1, S2 normal. No murmurs, rubs, or gallops.  ABDOMEN: Abdominal tenderness all over, no rebound tenderness, bowel sounds present.  EXTREMITIES: No pedal edema, cyanosis, or clubbing.  NEUROLOGIC: Cranial nerves II through XII are intact. Muscle strength 5/5 in all extremities. Sensation intact. Gait not checked.  PSYCHIATRIC: The patient is alert and oriented x 3.  SKIN: No obvious rash, lesion, or ulcer.    LABORATORY PANEL:   CBC Recent Labs  Lab 10/04/18 0442  WBC 6.3  HGB 14.3  HCT 41.7  PLT 214   ------------------------------------------------------------------------------------------------------------------  Chemistries  Recent Labs  Lab 10/03/18 1806 10/04/18 0442  NA 138 138  K 3.2* 3.1*  CL 96* 100  CO2 31 30  GLUCOSE 109* 115*  BUN 13 13  CREATININE 0.96 0.89  CALCIUM 8.9 8.4*  AST 28  --   ALT 35  --   ALKPHOS 100  --   BILITOT 0.8  --    ------------------------------------------------------------------------------------------------------------------  Cardiac Enzymes No results for input(s): TROPONINI in the last 168 hours. ------------------------------------------------------------------------------------------------------------------  RADIOLOGY:  No results found.  EKG:   Orders placed or performed during the hospital encounter of 09/23/18  . ED EKG  . ED EKG  . EKG 12-Lead  . EKG 12-Lead  . EKG 12-Lead  . EKG 12-Lead    ASSESSMENT AND PLAN:  53 year old female patient with history of carcinoid tumor of stomach, COPD, CHF, AICD, admitted for severe abdominal pain.   #1. secondary to intractable abdominal pain [place] to underlying gastric carcinoid tumor needing surgery soon but came in because of severe abdominal pain, nausea, poor p.o. intake, continue fentanyl patch, Percocet 5-3 25 1  tablet every 4-6 hours as needed, discontinue IV morphine, patient likely will be  discharged home tomorrow with oral pain meds, fentanyl patch, patient has scheduled PET scan tomorrow at 2 PM, I told the patient she will likely go home and keep that appointment.  #2/ history of hypothyroidism: Continue Synthyroid. #3 history of atrophic gastritis. #4 patient is scheduled to have a gallium PET scan tomorrow to see any evidence of metastasis.   All the records are reviewed and case discussed with Care Management/Social Workerr. Management plans discussed with the patient, family and they are in agreement.  CODE STATUS: Full code  TOTAL TIME TAKING CARE OF THIS PATIENT: 40 minutes.   Possible discharge tomorrow.. More than 50% time spent in counseling, coordination of care Epifanio Lesches M.D on 10/04/2018 at 12:00 PM  Between 7am to 6pm - Pager - [phone]  After 6pm go to [email or web address] - password [email or web address] [facility]  Office  [phone]  CC: Primary care physician; [name], MD   Note: This dictation was prepared with Dragon dictation along with smaller phrase technology. Any transcriptional errors that result from this process are unintentional.

## 2018-10-04 NOTE — Plan of Care (Signed)
  Problem: Education: Goal: Knowledge of General Education information will improve Description Including pain rating scale, medication(s)/side effects and non-pharmacologic comfort measures 10/04/2018 1720 by Latanya Maudlin, RN Outcome: Progressing 10/04/2018 1701 by Latanya Maudlin, RN Outcome: Progressing   Problem: Health Behavior/Discharge Planning: Goal: Ability to manage health-related needs will improve 10/04/2018 1720 by Callyn Severtson A, RN Outcome: Progressing 10/04/2018 1701 by Margeret Stachnik A, RN Outcome: Progressing   Problem: Clinical Measurements: Goal: Ability to maintain clinical measurements within normal limits will improve 10/04/2018 1720 by Husam Hohn A, RN Outcome: Progressing 10/04/2018 1701 by Latanya Maudlin, RN Outcome: Progressing Goal: Will remain free from infection 10/04/2018 1720 by Delvin Hedeen A, RN Outcome: Progressing 10/04/2018 1701 by Latanya Maudlin, RN Outcome: Progressing Goal: Diagnostic test results will improve 10/04/2018 1720 by Nirvi Boehler A, RN Outcome: Progressing 10/04/2018 1701 by Latanya Maudlin, RN Outcome: Progressing Goal: Respiratory complications will improve 10/04/2018 1720 by Tifanie Gardiner A, RN Outcome: Progressing 10/04/2018 1701 by Latanya Maudlin, RN Outcome: Progressing Goal: Cardiovascular complication will be avoided 10/04/2018 1720 by Sahiti Joswick A, RN Outcome: Progressing 10/04/2018 1701 by Kamryn Messineo A, RN Outcome: Progressing   Problem: Pain Managment: Goal: General experience of comfort will improve 10/04/2018 1720 by Raven Harmes A, RN Outcome: Progressing 10/04/2018 1701 by Gilverto Dileonardo A, RN Outcome: Progressing  Pain control has been issue. Attempting to manage with p.o medications

## 2018-10-05 ENCOUNTER — Ambulatory Visit
Admission: RE | Admit: 2018-10-05 | Discharge: 2018-10-05 | Disposition: A | Discharge status: Home / Self Care | Payer: Medicaid Other | Source: Ambulatory Visit | Attending: Oncology | Admitting: Oncology

## 2018-10-05 DIAGNOSIS — E164 Increased secretion of gastrin: Secondary | ICD-10-CM | POA: Insufficient documentation

## 2018-10-05 DIAGNOSIS — D3A8 Other benign neuroendocrine tumors: Secondary | ICD-10-CM | POA: Insufficient documentation

## 2018-10-05 DIAGNOSIS — C7A092 Malignant carcinoid tumor of the stomach: Secondary | ICD-10-CM

## 2018-10-05 DIAGNOSIS — D509 Iron deficiency anemia, unspecified: Secondary | ICD-10-CM | POA: Insufficient documentation

## 2018-10-05 MED ORDER — FENTANYL 50 MCG/HR TD PT72: 1.0000 | MEDICATED_PATCH | TRANSDERMAL | 0 refills | Status: DC

## 2018-10-05 MED ORDER — OXYCODONE-ACETAMINOPHEN 5-325 MG PO TABS: 1.0000 | ORAL_TABLET | Freq: Three times a day (TID) | ORAL | 0 refills | Status: DC | PRN

## 2018-10-05 MED ORDER — PROMETHAZINE HCL 25 MG PO TABS: 25.0000 mg | ORAL_TABLET | Freq: Four times a day (QID) | ORAL | 0 refills | Status: DC | PRN

## 2018-10-05 MED ORDER — GALLIUM GA 68 DOTATATE IV KIT
5.7500 | PACK | Freq: Once | INTRAVENOUS | Status: AC
  Administered 2018-10-05: 5.75 via INTRAVENOUS

## 2018-10-05 MED ORDER — FENTANYL 50 MCG/HR TD PT72
1.0000 | MEDICATED_PATCH | TRANSDERMAL | Status: DC
  Administered 2018-10-05: 1 via TRANSDERMAL

## 2018-10-05 NOTE — Progress Notes (Signed)
Patient discharge teaching given, including activity, diet, follow-up appoints, and medications. Patient verbalized understanding of all discharge instructions. Tech's in PET scan procedural area was made aware that fentanyl patch on right arm and that pt has a pacemaker/defib. IV access was kept in per PET scan tech request. Vitals are stable. Skin is intact except as charted in most recent assessments. Pt to be escorted to registration by volunteer to get checked in for PET scan.   Brittany Nguyen [employer]

## 2018-10-05 NOTE — Discharge Summary (Signed)
Brittany Nguyen, is a 53 y.o. female  DOB 01/15/1966  MRN [id number].  Admission date:  10/03/2018  Admitting Physician  Vaughan Basta, MD  Discharge Date:  10/05/2018   Primary MD  [name], MD  Recommendations for primary care physician for things to follow:   Follow with PCP in 1 week Follow-up with cancer center Dr. Tamela Gammon as scheduled Follow-up with [facility] oncology as scheduled for her partial gastrectomy.   Admission Diagnosis  Epigastric pain [R10.13] Carcinoid tumor of stomach, unspecified whether malignant [D3A.092]   Discharge Diagnosis  Epigastric pain [R10.13] Carcinoid tumor of stomach, unspecified whether malignant [D3A.092]    Active Problems:   Abdominal pain      Past Medical History:  Diagnosis Date  . Asthma 2013  . 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 CHF (congestive heart failure) ([place])   . COPD (chronic obstructive pulmonary disease) ([place])   . Diverticulitis 2010  . DVT (deep venous thrombosis) ([place])   . Endometriosis 1990  . Heart disease 2013  . Hx MRSA infection   . Iron deficiency   . Lump or mass in breast   . Restless leg     Past Surgical History:  Procedure Laterality Date  . ABDOMINAL HYSTERECTOMY     age 17  . BREAST BIOPSY Right 2014   benign  . CARDIAC DEFIBRILLATOR PLACEMENT    . CARDIAC DEFIBRILLATOR PLACEMENT    . CESAREAN SECTION    . CHOLECYSTECTOMY    . COLECTOMY    . COLONOSCOPY WITH PROPOFOL N/A 02/22/2018   Procedure: COLONOSCOPY WITH PROPOFOL;  Surgeon: Toledo, Benay Pike, MD;  Location: [facility] ENDOSCOPY;  Service: Gastroenterology;  Laterality: N/A;  . ESOPHAGOGASTRODUODENOSCOPY (EGD) WITH PROPOFOL N/A 02/22/2018   Procedure: ESOPHAGOGASTRODUODENOSCOPY (EGD) WITH PROPOFOL;  Surgeon: Toledo, Benay Pike, MD;  Location: [facility]  ENDOSCOPY;  Service: Gastroenterology;  Laterality: N/A;  . NASAL SINUS SURGERY  2012  . OOPHORECTOMY         History of present illness and  Hospital Course:     Kindly see H&P for history of present illness and admission details, please review complete Labs, Consult reports and Test reports for all details in brief  HPI  from the history and physical done on the day of admission 53 year old female patient with history of COPD, congestive heart failure, AICD, history of carcinoid tumor of stomach admitted for severe abdominal pain, pain control.   Hospital Course   #1. intractable abdominal pain, nausea; secondary to underlying gastric carcinoid, admitted for pain control, patient already on fentanyl patch 50 mcg, received IV morphine initially and discontinued IV morphine and prepared for discharge, patient right now on fentanyl patch, Percocet 5-3 25 mg.every 8 hours.  Can use Phenergan for nausea.  This pain regimen is helping her with abdominal pain, discharged home with fentanyl patch 50 mcg every 72 hours, Percocet 5 x 325 mg every 8 hours.  Patient has appointment with [facility] oncology on February 14 for partial gastrectomy,.  Scheduled for PET scan at 2 PM today. 2.  Asthma: No wheezing. 3.  History of nonischemic cardiomyopathy, chronic systolic heart failure with EF 45%, follows up with [facility] cardiology, patient is on Lasix, losartan, spironolactone, metoprolol, continue them.  Patient has dual-chamber ICD placed in July 25 of 2018.  Depression: Continue home antidepressants Discharge Condition: Stable   Follow UP      Discharge Instructions  and  Discharge Medications     Allergies as of 10/05/2018  Reactions   Contrast Media [iodinated Diagnostic Agents] Shortness Of Breath   Per CT report in 2014 pt had break through contrast reaction with hives and SOB following 13 hour prep. MSY    Metrizamide Shortness Of Breath   Penicillins Hives, Other (See Comments)   Other  reaction(s): Other (See Comments) Has patient had a PCN reaction causing immediate rash, facial/tongue/throat swelling, SOB or lightheadedness with hypotension: Yes Has patient had a PCN reaction causing severe rash involving mucus membranes or skin necrosis: No Has patient had a PCN reaction that required hospitalization No Has patient had a PCN reaction occurring within the last 10 years: No If all of the above answers are "NO", then may proceed with Cephalosporin use. Has patient had a PCN reaction causing immediate rash, facial/tongue/throat swelling, SOB or lightheadedness with hypotension: Yes Has patient had a PCN reaction causing severe rash involving mucus membranes or skin necrosis: No Has patient had a PCN reaction that required hospitalization No Has patient had a PCN reaction occurring within the last 10 years: No If all of the above answers are "NO", then may proceed with Cephalosporin use.   Isosorbide Nitrate    Other reaction(s): Headache   Latex Hives   Ondansetron Other (See Comments)   Severe headache   Zofran [ondansetron Hcl] Other (See Comments)   Causes migraines   Povidone-iodine Rash   Blistering rash   Pulmicort [budesonide] Itching      Medication List    TAKE these medications   albuterol 1.25 MG/3ML nebulizer solution Commonly known as:  ACCUNEB Take 3 mLs by nebulization every 6 (six) hours as needed for wheezing.   baclofen 10 MG tablet Commonly known as:  LIORESAL Take 10 mg by mouth 2 (two) times daily.   beclomethasone 80 MCG/ACT inhaler Commonly known as:  QVAR Inhale 2 puffs into the lungs 2 (two) times daily.   busPIRone 15 MG tablet Commonly known as:  BUSPAR Take 15 mg by mouth 2 (two) times daily.   fentaNYL 50 MCG/HR Commonly known as:  [place] 1 patch onto the skin every 3 (three) days. Start taking on:  October 08, 2018   furosemide 40 MG tablet Commonly known as:  LASIX Take 40 mg by mouth 2 (two) times daily.    levothyroxine 150 MCG tablet Commonly known as:  SYNTHROID, LEVOTHROID Take 150 mcg by mouth daily before breakfast.   losartan 25 MG tablet Commonly known as:  COZAAR Take 25 mg by mouth daily.   meloxicam 15 MG tablet Commonly known as:  MOBIC Take 15 mg by mouth daily.   nitroGLYCERIN 0.4 MG SL tablet Commonly known as:  NITROSTAT Place 1 tablet (0.4 mg total) under the tongue every 5 (five) minutes as needed for chest pain.   oxyCODONE-acetaminophen 5-325 MG tablet Commonly known as:  PERCOCET/ROXICET Take 1-2 tablets by mouth every 8 (eight) hours as needed for moderate pain.   potassium chloride SA 20 MEQ tablet Commonly known as:  KLOR-CON M20 Take 1 tablet (20 mEq total) by mouth 2 (two) times daily for 7 days. What changed:    how much to take  when to take this  reasons to take this  additional instructions   promethazine 25 MG tablet Commonly known as:  PHENERGAN Take 1 tablet (25 mg total) by mouth every 6 (six) hours as needed for nausea or vomiting.   rOPINIRole 2 MG tablet Commonly known as:  REQUIP Take 2 tablets (4 mg total) by mouth at  bedtime. 2-4  Mg per night What changed:  additional instructions   spironolactone 25 MG tablet Commonly known as:  ALDACTONE Take 25 mg by mouth 2 (two) times daily.   traMADol 50 MG tablet Commonly known as:  ULTRAM Take 50 mg by mouth every 6 (six) hours as needed.   Venlafaxine HCl 37.5 MG Tb24 Take 37.5 mg by mouth daily.   vitamin B-12 500 MCG tablet Commonly known as:  CYANOCOBALAMIN Take 500 mcg by mouth daily.         Diet and Activity recommendation: See Discharge Instructions above   Consults obtained -none   Major procedures and Radiology Reports - PLEASE review detailed and final reports for all details, in brief -     Ct Abdomen Pelvis Wo Contrast  Result Date: 09/23/2018 CLINICAL DATA:  53 year old with acute abdominal pain. Rectal bleeding. History of gastric lesion. EXAM: CT  ABDOMEN AND PELVIS WITHOUT CONTRAST TECHNIQUE: Multidetector CT imaging of the abdomen and pelvis was performed following the standard protocol without IV contrast. COMPARISON:  05/25/2018 FINDINGS: Lower chest: Lung bases are clear. No pleural effusions. Cardiac defibrillator lead in the right ventricle. Hepatobiliary: Normal appearance of the liver. Cholecystectomy. No biliary dilatation. Pancreas: Unremarkable. No pancreatic ductal dilatation or surrounding inflammatory changes. Spleen: Normal in size without focal abnormality. Adrenals/Urinary Tract: Normal adrenal glands. Urinary bladder is decompressed. Normal appearance of both kidneys without hydronephrosis. Small left extrarenal pelvis is stable. Negative for kidney stones. Stomach/Bowel: Again noted is a low-density lesion along the anterior aspect of the gastric antrum that measures up to 2.5 cm and minimally changed from the prior exams. This appears to represent the previously biopsied gastric lesion. No evidence for gastric distension or inflammation. Normal appearance of the duodenum. Normal appearance of the small bowel structures. Surgical anastomosis in the sigmoid colon. Appendix is not confidently identified. Vascular/Lymphatic: Vascular structures are unremarkable without atherosclerotic calcifications. No significant lymph node enlargement in the abdomen or pelvis. Reproductive: Status post hysterectomy. No adnexal masses. Other: Surgical clips around the sigmoid mesocolon possibly related to a lymph node dissection. Negative for free fluid. Small midline ventral hernia containing fat below the umbilicus. Negative for free air. Musculoskeletal: No acute bone abnormality. IMPRESSION: 1. No acute abnormality in the abdomen or pelvis. 2. Stable appearance of the low-density gastric lesion. No acute inflammatory changes involving the stomach. 3. Stable postoperative changes as described. Electronically Signed   By: Markus Daft M.D.   On: 09/23/2018  11:29    Micro Results     No results found for this or any previous visit (from the past 240 hour(s)).     Today   Subjective:   Aibhlinn Avitabile today has no headache,no chest abdominal pain,no new weakness tingling or numbness, feels much better wants to go home today.   Objective:   Blood pressure 105/76, pulse 100, temperature 98.2 F (36.8 C), temperature source Oral, resp. rate 16, height 5\' 5"  (1.651 m), weight 104.3 kg, last menstrual period 01/22/2012, SpO2 93 %.   Intake/Output Summary (Last 24 hours) at 10/05/2018 1157 Last data filed at 10/05/2018 0948 Gross per 24 hour  Intake 480 ml  Output 300 ml  Net 180 ml    Exam Awake Alert, Oriented x 3, No new F.N deficits, Normal affect [place].AT,PERRAL Supple Neck,No JVD, No cervical lymphadenopathy appriciated.  Symmetrical Chest wall movement, Good air movement bilaterally, CTAB RRR,No Gallops,Rubs or new Murmurs, No Parasternal Heave +ve B.Sounds, Abd Soft, Non tender, No organomegaly appriciated, No rebound -  guarding or rigidity. No Cyanosis, Clubbing or edema, No new Rash or bruise  Data Review   CBC w Diff:  Lab Results  Component Value Date   WBC 6.3 10/04/2018   HGB 14.3 10/04/2018   HGB 11.7 (L) 10/03/2014   HCT 41.7 10/04/2018   HCT 34.9 (L) 10/03/2014   PLT 214 10/04/2018   PLT 273 10/03/2014   LYMPHOPCT 27 10/03/2018   LYMPHOPCT 17.8 04/16/2014   MONOPCT 7 10/03/2018   MONOPCT 6.2 04/16/2014   EOSPCT 2 10/03/2018   EOSPCT 3.3 04/16/2014   BASOPCT 1 10/03/2018   BASOPCT 0.4 04/16/2014    CMP:  Lab Results  Component Value Date   NA 138 10/04/2018   NA 139 10/03/2014   K 3.1 (L) 10/04/2018   K 3.9 10/03/2014   CL 100 10/04/2018   CL 106 10/03/2014   CO2 30 10/04/2018   CO2 28 10/03/2014   BUN 13 10/04/2018   BUN 11 10/03/2014   CREATININE 0.89 10/04/2018   CREATININE 1.03 10/03/2014   PROT 8.4 (H) 10/03/2018   PROT 7.5 12/14/2012   ALBUMIN 4.6 10/03/2018   ALBUMIN 3.4 12/14/2012    BILITOT 0.8 10/03/2018   BILITOT 0.1 (L) 12/14/2012   ALKPHOS 100 10/03/2018   ALKPHOS 126 12/14/2012   AST 28 10/03/2018   AST 19 12/14/2012   ALT 35 10/03/2018   ALT 25 12/14/2012  .   Total Time in preparing paper work, data evaluation and todays exam - 35 minutes  Epifanio Lesches M.D on 10/05/2018 at 11:57 AM    Note: This dictation was prepared with Dragon dictation along with smaller phrase technology. Any transcriptional errors that result from this process are unintentional.

## 2018-10-05 NOTE — Progress Notes (Signed)
Pt is complaining of itching generalized. PRN benadryl was given at 0628 order is Q6, verbal order to give PRN benadryl at this time. There are no hives/rashes/redness noted anywhere.   [name] [employer]

## 2018-10-05 NOTE — Progress Notes (Signed)
Patient complains of itching around stomach, top of thighs and now around her mouth; no rash noted; Benadryl IVP given. Will continue to monitor. Barbaraann Faster, RN 6:30 AM 10/05/2018

## 2018-10-05 NOTE — Progress Notes (Signed)
Old Fentanyl patch removed and witnessed disposal by Gillermina Hu, RN; Barbaraann Faster, RN 6:32 AM 10/05/2018

## 2018-10-11 ENCOUNTER — Telehealth: Payer: Self-pay | Admitting: *Deleted

## 2018-10-11 NOTE — Telephone Encounter (Signed)
I talked to [place] yesterday and I called her phone this morning and her husband answered the phone and said she is taken a nap right now.  Planed to him that we are fine with canceling her appointment for 210 to see Dr. Janese Banks because she seen the surgeon on February 14.  Asked to see that Brittany Nguyen give [place] a call back and let us know after she is actually had the surgery to take the neuroendocrine tumor out.  We did see her in about a week so we would be able to look up the pathology and make sure nothing else is going on.  We will have her to call me back as soon as she gets a week from her nap to discuss everything but it sounds fine

## 2018-10-15 ENCOUNTER — Inpatient Hospital Stay: Payer: Medicaid Other | Admitting: Oncology

## 2018-10-19 DIAGNOSIS — C7A092 Malignant carcinoid tumor of the stomach: Secondary | ICD-10-CM | POA: Insufficient documentation

## 2018-10-19 DIAGNOSIS — D3A8 Other benign neuroendocrine tumors: Secondary | ICD-10-CM

## 2018-10-22 ENCOUNTER — Telehealth: Payer: Self-pay | Admitting: *Deleted

## 2018-10-22 NOTE — Telephone Encounter (Signed)
Patient called asking if she had an appointment today. I told her she had no appts at all as she cancelled them wanting to wait until surgeon finished. She states she was told that Dr Janese Banks wanted her to come in and discuss what the surgeon said. I offered to transfer her to scheduler and she declined asking that Judeen Hammans, RN be notified that she "touched base" and she can call her to let her know if she needs an appointment.

## 2018-10-22 NOTE — Telephone Encounter (Signed)
I called patient and she states that she was sent to Dr. Johnnye Sima and she found out then that she was a oncologist. That doctor told her the same thing that you had already told her. The MD said she was not sure that if Dr. Shelda Altes does this surgery or not-the doctor was going to contact Dr. Shelda Altes and if he was not the right person then she would send a referral to another surgeon. The patient will call and let us know when she gets appt with surgeon and once she has the surgery she wants to come back here and not cont. At [facility] in [place]

## 2018-11-26 ENCOUNTER — Other Ambulatory Visit: Payer: Medicaid Other

## 2018-11-26 ENCOUNTER — Ambulatory Visit: Payer: Medicaid Other | Admitting: Oncology

## 2018-12-07 ENCOUNTER — Emergency Department
Admission: EM | Admit: 2018-12-07 | Discharge: 2018-12-08 | Disposition: A | Discharge status: Home / Self Care | Payer: Medicaid Other | Attending: Emergency Medicine | Admitting: Emergency Medicine

## 2018-12-07 ENCOUNTER — Encounter: Payer: Self-pay | Admitting: Emergency Medicine

## 2018-12-07 ENCOUNTER — Emergency Department: Payer: Medicaid Other

## 2018-12-07 DIAGNOSIS — I5022 Chronic systolic (congestive) heart failure: Secondary | ICD-10-CM | POA: Diagnosis not present

## 2018-12-07 DIAGNOSIS — J449 Chronic obstructive pulmonary disease, unspecified: Secondary | ICD-10-CM | POA: Diagnosis not present

## 2018-12-07 DIAGNOSIS — R0789 Other chest pain: Secondary | ICD-10-CM

## 2018-12-07 DIAGNOSIS — R1010 Upper abdominal pain, unspecified: Secondary | ICD-10-CM | POA: Diagnosis present

## 2018-12-07 DIAGNOSIS — R079 Chest pain, unspecified: Secondary | ICD-10-CM | POA: Diagnosis not present

## 2018-12-07 DIAGNOSIS — Z79899 Other long term (current) drug therapy: Secondary | ICD-10-CM | POA: Insufficient documentation

## 2018-12-07 DIAGNOSIS — Z95810 Presence of automatic (implantable) cardiac defibrillator: Secondary | ICD-10-CM | POA: Insufficient documentation

## 2018-12-07 DIAGNOSIS — E876 Hypokalemia: Secondary | ICD-10-CM

## 2018-12-07 LAB — COMPREHENSIVE METABOLIC PANEL
ALT: 26 U/L (ref 0–44)
AST: 27 U/L (ref 15–41)
Albumin: 4.1 g/dL (ref 3.5–5.0)
Alkaline Phosphatase: 77 U/L (ref 38–126)
Anion gap: 12 (ref 5–15)
BUN: 12 mg/dL (ref 6–20)
CO2: 31 mmol/L (ref 22–32)
Calcium: 8.7 mg/dL — ABNORMAL LOW (ref 8.9–10.3)
Chloride: 96 mmol/L — ABNORMAL LOW (ref 98–111)
Creatinine, Ser: 1.09 mg/dL — ABNORMAL HIGH (ref 0.44–1.00)
GFR calc Af Amer: >60 mL/min (ref >60)
GFR calc non Af Amer: 59 mL/min — ABNORMAL LOW (ref >60)
Glucose, Bld: 78 mg/dL (ref 70–99)
Potassium: 2.7 mmol/L — CL (ref 3.5–5.1)
Sodium: 139 mmol/L (ref 135–145)
Total Bilirubin: 0.4 mg/dL (ref 0.3–1.2)
Total Protein: 7.7 g/dL (ref 6.5–8.1)

## 2018-12-07 LAB — CBC
HCT: 40 % (ref 36.0–46.0)
Hemoglobin: 13.9 g/dL (ref 12.0–15.0)
MCH: 30.6 pg (ref 26.0–34.0)
MCHC: 34.8 g/dL (ref 30.0–36.0)
MCV: 88.1 fL (ref 80.0–100.0)
Platelets: 332 10*3/uL (ref 150–400)
RBC: 4.54 MIL/uL (ref 3.87–5.11)
RDW: 13.2 % (ref 11.5–15.5)
WBC: 11 10*3/uL — ABNORMAL HIGH (ref 4.0–10.5)
nRBC: 0 % (ref 0.0–0.2)

## 2018-12-07 LAB — TROPONIN I: Troponin I: <0.03 ng/mL (ref <0.03)

## 2018-12-07 MED ORDER — LIDOCAINE VISCOUS HCL 2 % MT SOLN
15.0000 mL | Freq: Once | OROMUCOSAL | Status: DC
  Filled 2018-12-07: qty 15

## 2018-12-07 MED ORDER — PROMETHAZINE HCL 25 MG/ML IJ SOLN
12.5000 mg | Freq: Once | INTRAMUSCULAR | Status: AC
  Administered 2018-12-07: 12.5 mg via INTRAVENOUS
  Filled 2018-12-07: qty 1

## 2018-12-07 MED ORDER — ALUM & MAG HYDROXIDE-SIMETH 200-200-20 MG/5ML PO SUSP
30.0000 mL | Freq: Once | ORAL | Status: DC
  Filled 2018-12-07: qty 30

## 2018-12-07 MED ORDER — SODIUM CHLORIDE 0.9 % IV SOLN
1000.0000 mL | Freq: Once | INTRAVENOUS | Status: AC
  Administered 2018-12-07: 1000 mL via INTRAVENOUS

## 2018-12-07 MED ORDER — POTASSIUM CHLORIDE CRYS ER 20 MEQ PO TBCR
40.0000 meq | EXTENDED_RELEASE_TABLET | Freq: Once | ORAL | Status: AC
  Administered 2018-12-07: 40 meq via ORAL
  Filled 2018-12-07: qty 2

## 2018-12-07 MED ORDER — MORPHINE SULFATE (PF) 4 MG/ML IV SOLN
4.0000 mg | Freq: Once | INTRAVENOUS | Status: AC
  Administered 2018-12-07: 4 mg via INTRAVENOUS
  Filled 2018-12-07: qty 1

## 2018-12-07 NOTE — ED Notes (Addendum)
Pt given warm blankets. Given call bell and educated to use it.

## 2018-12-07 NOTE — ED Notes (Signed)
Pt given this RN's phone to speak with husband as pt agreed and wanted to update him.

## 2018-12-07 NOTE — ED Notes (Signed)
Will d/c pt once bolus finished.

## 2018-12-07 NOTE — ED Notes (Signed)
Critical potassium of 2.7 called from lab. Dr. Corky Downs notified. No new verbal orders received.

## 2018-12-07 NOTE — ED Notes (Signed)
Grn and Pink recollect sent to lab.

## 2018-12-07 NOTE — ED Provider Notes (Signed)
[facility] Emergency Department Provider Note   ____________________________________________    I have reviewed the triage vital signs and the nursing notes.   HISTORY  Chief Complaint Chest Pain     HPI Brittany Nguyen is a 53 y.o. female with a history of carcinoid tumor of the stomach, COPD, diverticulitis who presents with complaints of upper abdominal pain as well as chest discomfort.  Patient notes this started earlier in the day but became worse just prior to arrival.  She denies shortness of breath.  She denies pleurisy.  No fevers or chills.  No nausea or vomiting.  She reports she has had similar pain in her epigastrium before.  She does not take anything for this  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    CHF (congestive heart failure) ([place])    COPD (chronic obstructive pulmonary disease) ([place])    Diverticulitis 2010   DVT (deep venous thrombosis) ([place])    Endometriosis 1990   Heart disease 2013   Hx MRSA infection    Iron deficiency    Lump or mass in breast    Restless leg     Patient Active Problem List   Diagnosis Date Noted   Iron deficiency anemia 03/27/2018   Anemia 03/26/2018   Abdominal pain 02/20/2018   Unstable angina (HCC)    Encounter for anticoagulation discussion and counseling    C. difficile colitis 01/14/2018   GIB (gastrointestinal bleeding) 01/08/2018   SOB (shortness of breath) 11/01/2017   Influenza A 10/29/2017   Acute respiratory failure ([place]) 09/24/2017   Sepsis ([place]) 07/03/2017   COPD (chronic obstructive pulmonary disease) ([place]) 04/26/2017   Community acquired pneumonia 45/62/5638   Chronic systolic heart failure ([place]) 02/03/2017   Hypotension 02/03/2017   Chest pain 12/09/2016   RLS (restless legs syndrome) 12/09/2016   Cough, persistent 11/22/2015   Hypokalemia 11/22/2015   Leukocytosis  11/22/2015   Dyspnea 11/21/2015   Respiratory distress 09/19/2015   Breast pain 01/11/2013   Hx MRSA infection    Lump or mass in breast    GOITER, MULTINODULAR 01/20/2009   HYPOGLYCEMIA, UNSPECIFIED 01/20/2009   GERD 01/20/2009   DIVERTICULITIS OF COLON 01/20/2009    Past Surgical History:  Procedure Laterality Date   ABDOMINAL HYSTERECTOMY     age 84   BREAST BIOPSY Right 2014   benign   CARDIAC DEFIBRILLATOR PLACEMENT     CARDIAC DEFIBRILLATOR PLACEMENT     CESAREAN SECTION     CHOLECYSTECTOMY     COLECTOMY     COLONOSCOPY WITH PROPOFOL N/A 02/22/2018   Procedure: COLONOSCOPY WITH PROPOFOL;  Surgeon: Toledo, Benay Pike, MD;  Location: [facility] ENDOSCOPY;  Service: Gastroenterology;  Laterality: N/A;   ESOPHAGOGASTRODUODENOSCOPY (EGD) WITH PROPOFOL N/A 02/22/2018   Procedure: ESOPHAGOGASTRODUODENOSCOPY (EGD) WITH PROPOFOL;  Surgeon: Toledo, Benay Pike, MD;  Location: [facility] ENDOSCOPY;  Service: Gastroenterology;  Laterality: N/A;   NASAL SINUS SURGERY  2012   OOPHORECTOMY      Prior to Admission medications   Medication Sig Start Date End Date Taking? Authorizing Provider  albuterol (ACCUNEB) 1.25 MG/3ML nebulizer solution Take 3 mLs by nebulization every 6 (six) hours as needed for wheezing. 11/09/17   [provider]  baclofen (LIORESAL) 10 MG tablet Take 10 mg by mouth 2 (two) times daily. 06/11/18   [provider]  beclomethasone (QVAR) 80 MCG/ACT inhaler Inhale 2 puffs into the lungs 2 (two)  times daily. 06/05/17 10/03/18  Laverle Hobby, MD  busPIRone (BUSPAR) 15 MG tablet Take 15 mg by mouth 2 (two) times daily. 05/08/18   [provider]  cyanocobalamin 500 MCG tablet Take 500 mcg by mouth daily.    [provider]  fentaNYL (DURAGESIC) 50 MCG/HR Place 1 patch onto the skin every 3 (three) days. 10/08/18   Epifanio Lesches, MD  furosemide (LASIX) 40 MG tablet Take 40 mg by mouth 2 (two) times daily. 07/09/18   [provider]  levothyroxine (SYNTHROID, LEVOTHROID) 150 MCG tablet Take 150 mcg by mouth daily before breakfast.    [provider]  losartan (COZAAR) 25 MG tablet Take 25 mg by mouth daily.     [provider]  meloxicam (MOBIC) 15 MG tablet Take 15 mg by mouth daily. 08/30/18   [provider]  nitroGLYCERIN (NITROSTAT) 0.4 MG SL tablet Place 1 tablet (0.4 mg total) under the tongue every 5 (five) minutes as needed for chest pain. 01/24/17   Henreitta Leber, MD  oxyCODONE-acetaminophen (PERCOCET/ROXICET) 5-325 MG tablet Take 1-2 tablets by mouth every 8 (eight) hours as needed for moderate pain. 10/05/18   Epifanio Lesches, MD  potassium chloride SA (KLOR-CON M20) 20 MEQ tablet Take 1 tablet (20 mEq total) by mouth 2 (two) times daily for 7 days. Patient taking differently: Take 40 mEq by mouth daily as needed. 1 tablet twice a day 06/17/18 10/03/18  Hinda Kehr, MD  promethazine (PHENERGAN) 25 MG tablet Take 1 tablet (25 mg total) by mouth every 6 (six) hours as needed for nausea or vomiting. 10/05/18   Epifanio Lesches, MD  rOPINIRole (REQUIP) 2 MG tablet Take 2 tablets (4 mg total) by mouth at bedtime. 2-4  Mg per night Patient taking differently: Take 4 mg by mouth at bedtime.  01/16/18   Loletha Grayer, MD  spironolactone (ALDACTONE) 25 MG tablet Take 25 mg by mouth 2 (two) times daily.     [provider]  traMADol (ULTRAM) 50 MG tablet Take 50 mg by mouth every 6 (six) hours as needed.    [provider]  Venlafaxine HCl 37.5 MG TB24 Take 37.5 mg by mouth daily. 08/15/18   [provider]     Allergies Contrast media [iodinated diagnostic agents]; Metrizamide; Penicillins; Isosorbide nitrate; Latex; Ondansetron; Zofran [ondansetron hcl]; Povidone-iodine; and Pulmicort [budesonide]  Family History  Problem Relation Age of Onset   Cancer Mother 49       ovarian   CAD Mother    Cancer Father 76       brain   CAD  Father    Cancer Daughter 33       skin   Cancer Maternal Aunt 90       breast   Leukemia Paternal Grandfather     Social History Social History   Tobacco Use   Smoking status: Never Smoker   Smokeless tobacco: Never Used  Substance Use Topics   Alcohol use: No   Drug use: No    Review of Systems  Constitutional: No fever/chills Eyes: No visual changes.  ENT: No sore throat. Cardiovascular: As above Respiratory: Denies shortness of breath. Gastrointestinal: As above Genitourinary: Negative for dysuria. Musculoskeletal: Negative for back pain. Skin: Negative for rash. Neurological: Negative for headaches   ____________________________________________   PHYSICAL EXAM:  VITAL SIGNS: ED Triage Vitals  Enc Vitals Group     BP 12/07/18 2124 92/61     Pulse Rate 12/07/18 2124 (!) 109  Resp 12/07/18 2130 (!) 21     Temp --      Temp src --      SpO2 12/07/18 2124 95 %     Weight --      Height --      Head Circumference --      Peak Flow --      Pain Score 12/07/18 2233 7     Pain Loc --      Pain Edu? --      Excl. in [place]? --     Constitutional: Alert and oriented.   Nose: No congestion/rhinnorhea. Mouth/Throat: Mucous membranes are moist.    Cardiovascular: Normal rate, regular rhythm. Grossly normal heart sounds.  Good peripheral circulation. Respiratory: Normal respiratory effort.  No retractions.  Gastrointestinal: Soft and nontender. No distention.   Musculoskeletal: No lower extremity tenderness nor edema.  Warm and well perfused Neurologic:  Normal speech and language. No gross focal neurologic deficits are appreciated.  Skin:  Skin is warm, dry and intact. No rash noted. Psychiatric: Mood and affect are normal. Speech and behavior are normal.  ____________________________________________   LABS (all labs ordered are listed, but only abnormal results are displayed)  Labs Reviewed  CBC - Abnormal; Notable for the following  components:      Result Value   WBC 11.0 (*)    All other components within normal limits  COMPREHENSIVE METABOLIC PANEL  TROPONIN I  POC URINE PREG, ED  TYPE AND SCREEN   ____________________________________________  EKG  ED ECG REPORT I, Lavonia Drafts, the attending physician, personally viewed and interpreted this ECG.  Date: 12/07/2018  Rhythm: normal sinus rhythm QRS Axis: normal Intervals: normal ST/T Wave abnormalities: normal Narrative Interpretation: no evidence of acute ischemia  ____________________________________________  RADIOLOGY  Chest x-ray unremarkable ____________________________________________   PROCEDURES  Procedure(s) performed: No  Procedures   Critical Care performed: No ____________________________________________   INITIAL IMPRESSION / ASSESSMENT AND PLAN / ED COURSE  Pertinent labs & imaging results that were available during my care of the patient were reviewed by me and considered in my medical decision making (see chart for details).  Patient presents with epigastric and chest discomfort as described above, question gastritis/esophagitis.  ACS is on the differential although EKG is reassuring, not consistent with PE or dissection.  Will treat pain with IV morphine, IV Phenergan given allergies.  Pending labs, chest x-ray  X-ray unremarkable.  Patient with mild hypokalemia, we will give p.o. potassium here, GI cocktail for presumed esophagitis/gastritis.  The patient reports she is feeling much better after treatment.  Appropriate for discharge at this time, close follow-up with PCP, return precautions discussed.    ____________________________________________   FINAL CLINICAL IMPRESSION(S) / ED DIAGNOSES  Final diagnoses:  Chest pain        Note:  This document was prepared using Dragon voice recognition software and may include unintentional dictation errors.   Lavonia Drafts, MD 12/07/18 [phone]

## 2018-12-10 IMAGING — DX DG CHEST 1V PORT
1 series · 1 of 1 positions shown · non-contrast
Comparison: Chest x-ray 07/03/2017.

CLINICAL DATA: 51-year-old female with history of cough and
congestion for the past 3-4 days. Increasing shortness of breath
with central chest pain.

EXAM:
PORTABLE CHEST 1 VIEW

[chest ap]
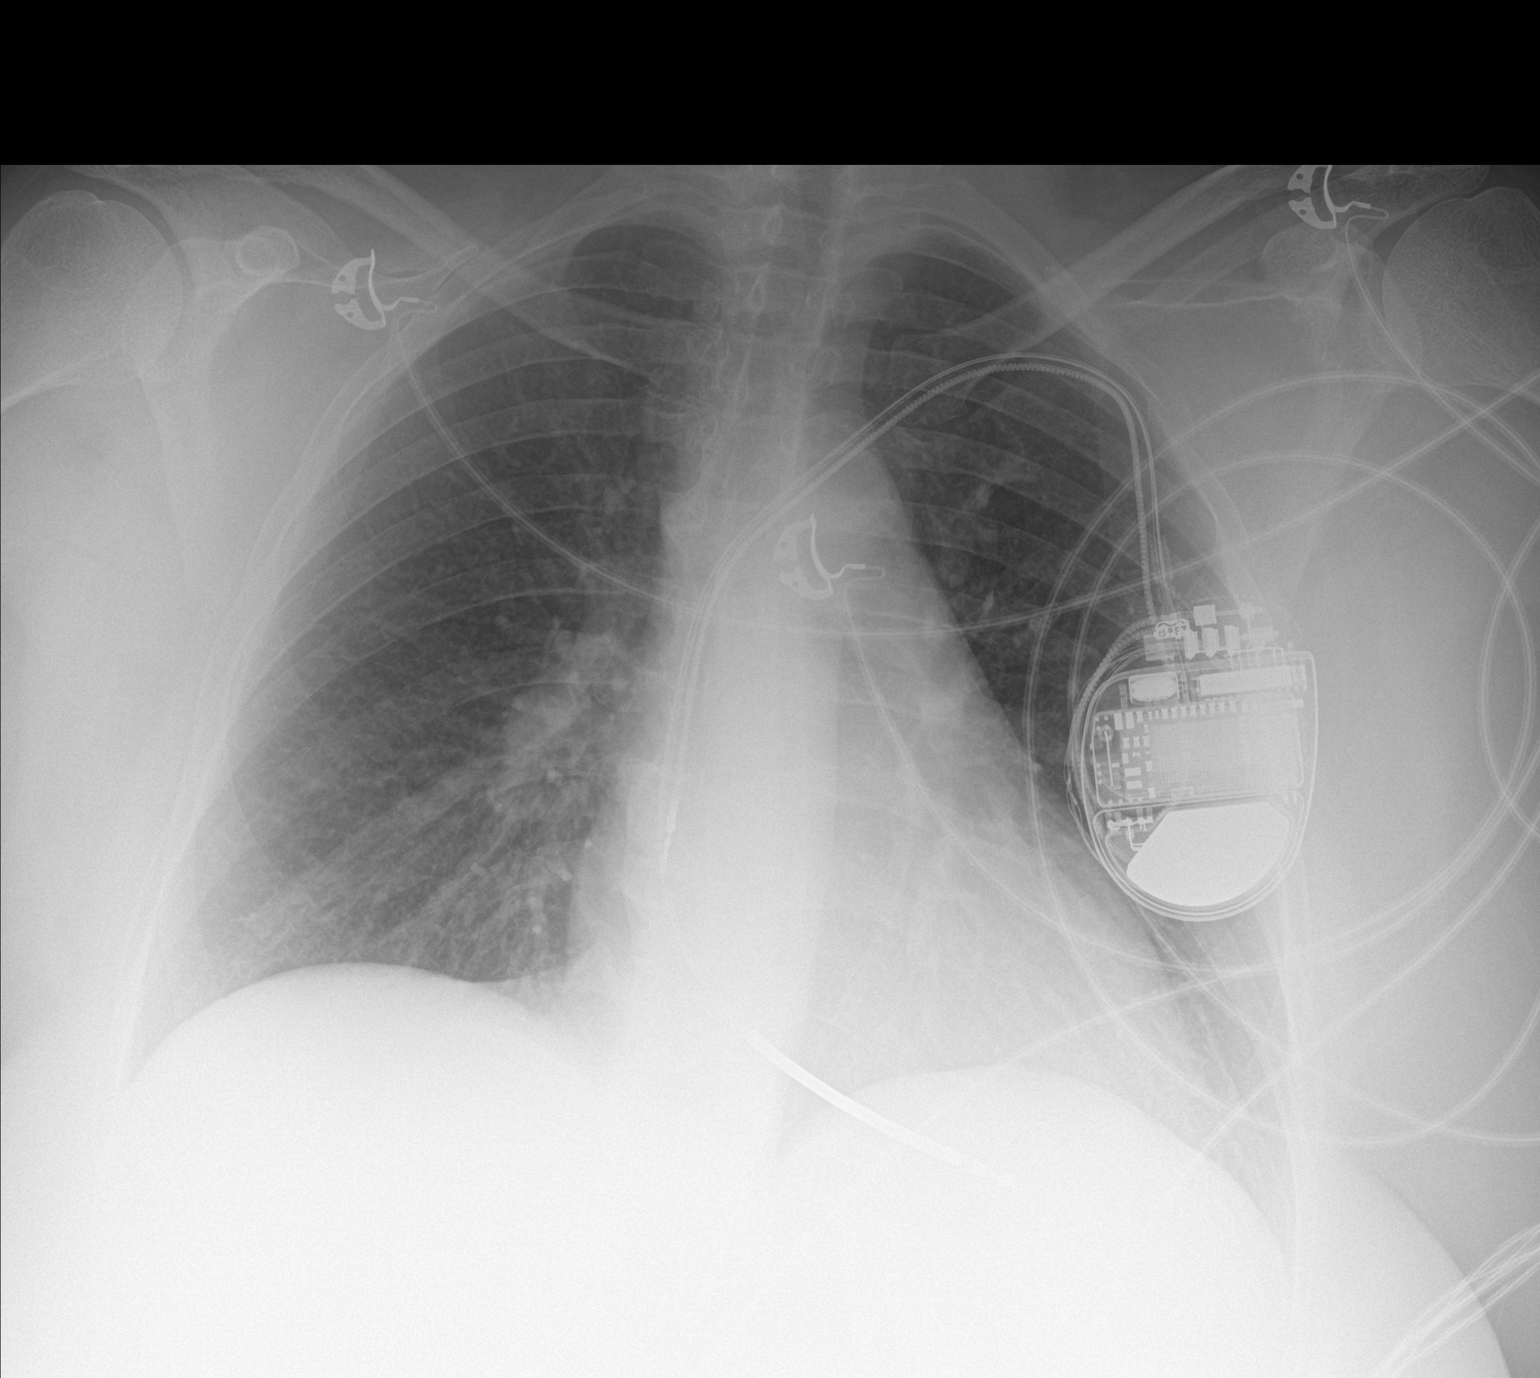

[1 of 1 positions shown; findings below may reference images not displayed]

FINDINGS: Lung volumes are normal. No consolidative airspace disease. No
pleural effusions. No pneumothorax. No pulmonary nodule or mass
noted. Pulmonary vasculature and the cardiomediastinal silhouette
are within normal limits. Left-sided pacemaker/AICD in place with
lead tips projecting over the expected location of the right atrium
and right ventricular apex.
IMPRESSION: 1.  No radiographic evidence of acute cardiopulmonary disease.

## 2018-12-23 ENCOUNTER — Other Ambulatory Visit: Payer: Self-pay

## 2018-12-23 ENCOUNTER — Encounter: Payer: Self-pay | Admitting: Emergency Medicine

## 2018-12-23 ENCOUNTER — Inpatient Hospital Stay
Admission: EM | Admit: 2018-12-23 | Discharge: 2018-12-24 | DRG: 641 | Disposition: A | Discharge status: Home / Self Care | Payer: Medicaid Other | Attending: Internal Medicine | Admitting: Internal Medicine

## 2018-12-23 ENCOUNTER — Emergency Department: Payer: Medicaid Other

## 2018-12-23 DIAGNOSIS — Z90710 Acquired absence of both cervix and uterus: Secondary | ICD-10-CM | POA: Diagnosis not present

## 2018-12-23 DIAGNOSIS — Z791 Long term (current) use of non-steroidal anti-inflammatories (NSAID): Secondary | ICD-10-CM

## 2018-12-23 DIAGNOSIS — J449 Chronic obstructive pulmonary disease, unspecified: Secondary | ICD-10-CM | POA: Diagnosis present

## 2018-12-23 DIAGNOSIS — Z79891 Long term (current) use of opiate analgesic: Secondary | ICD-10-CM

## 2018-12-23 DIAGNOSIS — E860 Dehydration: Secondary | ICD-10-CM | POA: Diagnosis present

## 2018-12-23 DIAGNOSIS — G2581 Restless legs syndrome: Secondary | ICD-10-CM | POA: Diagnosis present

## 2018-12-23 DIAGNOSIS — E876 Hypokalemia: Secondary | ICD-10-CM | POA: Diagnosis not present

## 2018-12-23 DIAGNOSIS — Z95810 Presence of automatic (implantable) cardiac defibrillator: Secondary | ICD-10-CM

## 2018-12-23 DIAGNOSIS — Z880 Allergy status to penicillin: Secondary | ICD-10-CM

## 2018-12-23 DIAGNOSIS — R1010 Upper abdominal pain, unspecified: Secondary | ICD-10-CM

## 2018-12-23 DIAGNOSIS — Z888 Allergy status to other drugs, medicaments and biological substances status: Secondary | ICD-10-CM | POA: Diagnosis not present

## 2018-12-23 DIAGNOSIS — Z79890 Hormone replacement therapy (postmenopausal): Secondary | ICD-10-CM | POA: Diagnosis not present

## 2018-12-23 DIAGNOSIS — Z9049 Acquired absence of other specified parts of digestive tract: Secondary | ICD-10-CM

## 2018-12-23 DIAGNOSIS — Z806 Family history of leukemia: Secondary | ICD-10-CM

## 2018-12-23 DIAGNOSIS — Z86718 Personal history of other venous thrombosis and embolism: Secondary | ICD-10-CM | POA: Diagnosis not present

## 2018-12-23 DIAGNOSIS — K529 Noninfective gastroenteritis and colitis, unspecified: Secondary | ICD-10-CM | POA: Diagnosis present

## 2018-12-23 DIAGNOSIS — R112 Nausea with vomiting, unspecified: Secondary | ICD-10-CM

## 2018-12-23 DIAGNOSIS — I509 Heart failure, unspecified: Secondary | ICD-10-CM | POA: Diagnosis present

## 2018-12-23 DIAGNOSIS — Z91040 Latex allergy status: Secondary | ICD-10-CM

## 2018-12-23 DIAGNOSIS — Z91041 Radiographic dye allergy status: Secondary | ICD-10-CM

## 2018-12-23 DIAGNOSIS — Z85028 Personal history of other malignant neoplasm of stomach: Secondary | ICD-10-CM

## 2018-12-23 DIAGNOSIS — Z8614 Personal history of Methicillin resistant Staphylococcus aureus infection: Secondary | ICD-10-CM | POA: Diagnosis not present

## 2018-12-23 DIAGNOSIS — R0602 Shortness of breath: Secondary | ICD-10-CM | POA: Diagnosis not present

## 2018-12-23 DIAGNOSIS — Z79899 Other long term (current) drug therapy: Secondary | ICD-10-CM | POA: Diagnosis not present

## 2018-12-23 DIAGNOSIS — Z8249 Family history of ischemic heart disease and other diseases of the circulatory system: Secondary | ICD-10-CM | POA: Diagnosis not present

## 2018-12-23 HISTORY — DX: Malignant neoplasm of stomach, unspecified: C16.9

## 2018-12-23 LAB — CBC WITH DIFFERENTIAL/PLATELET
Abs Immature Granulocytes: 0.02 10*3/uL (ref 0.00–0.07)
Basophils Absolute: 0 10*3/uL (ref 0.0–0.1)
Basophils Relative: 0 %
Eosinophils Absolute: 0.3 10*3/uL (ref 0.0–0.5)
Eosinophils Relative: 3 %
HCT: 37.2 % (ref 36.0–46.0)
Hemoglobin: 13.3 g/dL (ref 12.0–15.0)
Immature Granulocytes: 0 %
Lymphocytes Relative: 40 %
Lymphs Abs: 3 10*3/uL (ref 0.7–4.0)
MCH: 31.1 pg (ref 26.0–34.0)
MCHC: 35.8 g/dL (ref 30.0–36.0)
MCV: 87.1 fL (ref 80.0–100.0)
Monocytes Absolute: 0.6 10*3/uL (ref 0.1–1.0)
Monocytes Relative: 7 %
Neutro Abs: 3.8 10*3/uL (ref 1.7–7.7)
Neutrophils Relative %: 50 %
Platelets: 252 10*3/uL (ref 150–400)
RBC: 4.27 MIL/uL (ref 3.87–5.11)
RDW: 12.9 % (ref 11.5–15.5)
WBC: 7.7 10*3/uL (ref 4.0–10.5)
nRBC: 0 % (ref 0.0–0.2)

## 2018-12-23 LAB — BASIC METABOLIC PANEL
Anion gap: 8 (ref 5–15)
BUN: 13 mg/dL (ref 6–20)
CO2: 30 mmol/L (ref 22–32)
Calcium: 8.8 mg/dL — ABNORMAL LOW (ref 8.9–10.3)
Chloride: 100 mmol/L (ref 98–111)
Creatinine, Ser: 0.97 mg/dL (ref 0.44–1.00)
GFR calc Af Amer: >60 mL/min (ref >60)
GFR calc non Af Amer: >60 mL/min (ref >60)
Glucose, Bld: 125 mg/dL — ABNORMAL HIGH (ref 70–99)
Potassium: 2.8 mmol/L — ABNORMAL LOW (ref 3.5–5.1)
Sodium: 138 mmol/L (ref 135–145)

## 2018-12-23 LAB — BRAIN NATRIURETIC PEPTIDE: B Natriuretic Peptide: 29 pg/mL (ref 0.0–100.0)

## 2018-12-23 LAB — LIPASE, BLOOD: Lipase: 31 U/L (ref 11–51)

## 2018-12-23 MED ORDER — MORPHINE SULFATE (PF) 2 MG/ML IV SOLN
2.0000 mg | INTRAVENOUS | Status: DC | PRN
  Administered 2018-12-23 – 2018-12-24 (×2): 2 mg via INTRAVENOUS
  Filled 2018-12-23 (×2): qty 1

## 2018-12-23 MED ORDER — METOCLOPRAMIDE HCL 5 MG/ML IJ SOLN
10.0000 mg | Freq: Once | INTRAMUSCULAR | Status: AC
  Administered 2018-12-23: 10 mg via INTRAVENOUS
  Filled 2018-12-23: qty 2

## 2018-12-23 MED ORDER — ACETAMINOPHEN 650 MG RE SUPP: 650.0000 mg | Freq: Four times a day (QID) | RECTAL | Status: DC | PRN

## 2018-12-23 MED ORDER — ROPINIROLE HCL 1 MG PO TABS
4.0000 mg | ORAL_TABLET | Freq: Every day | ORAL | Status: DC
  Administered 2018-12-23: 4 mg via ORAL
  Filled 2018-12-23: qty 4

## 2018-12-23 MED ORDER — POTASSIUM CHLORIDE IN NACL 40-0.9 MEQ/L-% IV SOLN
INTRAVENOUS | Status: DC
  Administered 2018-12-23 – 2018-12-24 (×2): 75 mL/h via INTRAVENOUS
  Filled 2018-12-23 (×3): qty 1000

## 2018-12-23 MED ORDER — PROMETHAZINE HCL 25 MG/ML IJ SOLN
25.0000 mg | Freq: Once | INTRAMUSCULAR | Status: AC
  Administered 2018-12-23: 16:00:00 25 mg via INTRAVENOUS
  Filled 2018-12-23: qty 1

## 2018-12-23 MED ORDER — HYDROMORPHONE HCL 1 MG/ML IJ SOLN
0.5000 mg | Freq: Once | INTRAMUSCULAR | Status: AC
  Administered 2018-12-23: 0.5 mg via INTRAVENOUS
  Filled 2018-12-23: qty 1

## 2018-12-23 MED ORDER — SENNOSIDES-DOCUSATE SODIUM 8.6-50 MG PO TABS: 1.0000 | ORAL_TABLET | Freq: Every evening | ORAL | Status: DC | PRN

## 2018-12-23 MED ORDER — HYDROMORPHONE HCL 1 MG/ML IJ SOLN
1.0000 mg | Freq: Once | INTRAMUSCULAR | Status: AC
  Administered 2018-12-23: 16:00:00 1 mg via INTRAVENOUS
  Filled 2018-12-23: qty 1

## 2018-12-23 MED ORDER — SODIUM CHLORIDE 0.9 % IV SOLN
Freq: Once | INTRAVENOUS | Status: AC
  Administered 2018-12-23: 16:00:00 via INTRAVENOUS

## 2018-12-23 MED ORDER — MORPHINE SULFATE (PF) 4 MG/ML IV SOLN
4.0000 mg | Freq: Once | INTRAVENOUS | Status: DC
  Filled 2018-12-23: qty 1

## 2018-12-23 MED ORDER — ENOXAPARIN SODIUM 40 MG/0.4ML ~~LOC~~ SOLN
40.0000 mg | SUBCUTANEOUS | Status: DC
  Administered 2018-12-23: 40 mg via SUBCUTANEOUS
  Filled 2018-12-23: qty 0.4

## 2018-12-23 MED ORDER — PROMETHAZINE HCL 25 MG PO TABS
12.5000 mg | ORAL_TABLET | Freq: Four times a day (QID) | ORAL | Status: DC | PRN
  Filled 2018-12-23: qty 1

## 2018-12-23 MED ORDER — PROCHLORPERAZINE EDISYLATE 10 MG/2ML IJ SOLN
5.0000 mg | INTRAMUSCULAR | Status: DC | PRN
  Administered 2018-12-23 – 2018-12-24 (×2): 5 mg via INTRAVENOUS
  Filled 2018-12-23 (×3): qty 1

## 2018-12-23 MED ORDER — ACETAMINOPHEN 325 MG PO TABS: 650.0000 mg | ORAL_TABLET | Freq: Four times a day (QID) | ORAL | Status: DC | PRN

## 2018-12-23 NOTE — ED Notes (Signed)
Pt being transported to the floor at this time by [employer].

## 2018-12-23 NOTE — ED Notes (Signed)
Admitting MD at bedside.

## 2018-12-23 NOTE — ED Provider Notes (Signed)
[facility] Emergency Department Provider Note       Time seen: ----------------------------------------- 3:15 PM on 12/23/2018 -----------------------------------------   I have reviewed the triage vital signs and the nursing notes.  HISTORY   Chief Complaint Emesis; Cough; and Shortness of Breath    HPI Brittany Nguyen is a 53 y.o. female with a history of asthma, carcinoid tumor of the stomach, CHF, COPD, DVT, endometriosis, iron deficiency who presents to the ED for nausea, vomiting and diarrhea for the past 3 days.  She is unable to keep liquids down.  She is currently not on chemotherapy or radiation yet.  She complains of dry cough and shortness of breath as well with persistent pain to the upper abdomen.  Pain is 10 out of 10.  Past Medical History:  Diagnosis Date  . Asthma 2013  . 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 CHF (congestive heart failure) ([place])   . COPD (chronic obstructive pulmonary disease) ([place])   . Diverticulitis 2010  . DVT (deep venous thrombosis) ([place])   . Endometriosis 1990  . Heart disease 2013  . Hx MRSA infection   . Iron deficiency   . Lump or mass in breast   . Restless leg   . Stomach cancer [facility])     Patient Active Problem List   Diagnosis Date Noted  . Iron deficiency anemia 03/27/2018  . Anemia 03/26/2018  . Abdominal pain 02/20/2018  . Unstable angina ([place])   . Encounter for anticoagulation discussion and counseling   . C. difficile colitis 01/14/2018  . GIB (gastrointestinal bleeding) 01/08/2018  . SOB (shortness of breath) 11/01/2017  . Influenza A 10/29/2017  . Acute respiratory failure ([place]) 09/24/2017  . Sepsis ([place]) 07/03/2017  . COPD (chronic obstructive pulmonary disease) ([place]) 04/26/2017  . Community acquired pneumonia 04/25/2017  . Chronic systolic heart failure ([place]) 02/03/2017  . Hypotension 02/03/2017  . Chest pain 12/09/2016  . RLS (restless legs syndrome) 12/09/2016   . Cough, persistent 11/22/2015  . Hypokalemia 11/22/2015  . Leukocytosis 11/22/2015  . Dyspnea 11/21/2015  . Respiratory distress 09/19/2015  . Breast pain 01/11/2013  . Hx MRSA infection   . Lump or mass in breast   . GOITER, MULTINODULAR 01/20/2009  . HYPOGLYCEMIA, UNSPECIFIED 01/20/2009  . GERD 01/20/2009  . DIVERTICULITIS OF COLON 01/20/2009    Past Surgical History:  Procedure Laterality Date  . ABDOMINAL HYSTERECTOMY     age 46  . BREAST BIOPSY Right 2014   benign  . CARDIAC DEFIBRILLATOR PLACEMENT    . CARDIAC DEFIBRILLATOR PLACEMENT    . CESAREAN SECTION    . CHOLECYSTECTOMY    . COLECTOMY    . COLONOSCOPY WITH PROPOFOL N/A 02/22/2018   Procedure: COLONOSCOPY WITH PROPOFOL;  Surgeon: Toledo, Benay Pike, MD;  Location: [facility] ENDOSCOPY;  Service: Gastroenterology;  Laterality: N/A;  . ESOPHAGOGASTRODUODENOSCOPY (EGD) WITH PROPOFOL N/A 02/22/2018   Procedure: ESOPHAGOGASTRODUODENOSCOPY (EGD) WITH PROPOFOL;  Surgeon: Toledo, Benay Pike, MD;  Location: [facility] ENDOSCOPY;  Service: Gastroenterology;  Laterality: N/A;  . NASAL SINUS SURGERY  2012  . OOPHORECTOMY      Allergies Contrast media [iodinated diagnostic agents]; Metrizamide; Penicillins; Isosorbide nitrate; Latex; Lidocaine; Ondansetron; Zofran [ondansetron hcl]; Povidone-iodine; and Pulmicort [budesonide]  Social History Social History   Tobacco Use  . Smoking status: Never Smoker  . Smokeless tobacco: Never Used  Substance Use Topics  . Alcohol use: No  . Drug use: No   Review of Systems Constitutional: Negative for fever.  Cardiovascular: Negative for chest pain. Respiratory: Positive for cough and shortness of breath Gastrointestinal: Positive for abdominal pain, vomiting and diarrhea Musculoskeletal: Negative for back pain. Skin: Negative for rash. Neurological: Negative for headaches, focal weakness or numbness.  All systems negative/normal/unremarkable except as stated in the  HPI  ____________________________________________   PHYSICAL EXAM:  VITAL SIGNS: ED Triage Vitals  Enc Vitals Group     BP 12/23/18 1510 (!) 110/57     Pulse Rate 12/23/18 1510 (!) 103     Resp 12/23/18 1510 18     Temp 12/23/18 1510 98.1 F (36.7 C)     Temp Source 12/23/18 1510 Oral     SpO2 12/23/18 1510 95 %     Weight 12/23/18 1507 200 lb (90.7 kg)     Height 12/23/18 1507 5\' 5"  (1.651 m)     Head Circumference --      Peak Flow --      Pain Score 12/23/18 1507 10     Pain Loc --      Pain Edu? --      Excl. in [place]? --    Constitutional: Alert and oriented.  Mild distress Eyes: Conjunctivae are normal. Normal extraocular movements. ENT      Head: Normocephalic and atraumatic.      Nose: No congestion/rhinnorhea.      Mouth/Throat: Mucous membranes are moist.      Neck: No stridor. Cardiovascular: Normal rate, regular rhythm. No murmurs, rubs, or gallops. Respiratory: Normal respiratory effort without tachypnea nor retractions. Breath sounds are clear and equal bilaterally. No wheezes/rales/rhonchi. Gastrointestinal: Upper abdominal tenderness, no rebound or guarding.  Normal bowel sounds. Musculoskeletal: Nontender with normal range of motion in extremities. No lower extremity tenderness nor edema. Neurologic:  Normal speech and language. No gross focal neurologic deficits are appreciated.  Skin:  Skin is warm, dry and intact. No rash noted. Psychiatric: Mood and affect are normal. Speech and behavior are normal.  ____________________________________________  ED COURSE:  As part of my medical decision making, I reviewed the following data within the [place] History obtained from family if available, nursing notes, old chart and ekg, as well as notes from prior ED visits. Patient presented for nausea, vomiting and diarrhea as well as cough and shortness of breath, we will assess with labs and imaging as indicated at this time.    Procedures  Brittany Nguyen was evaluated in Emergency Department on 12/23/2018 for the symptoms described in the history of present illness. She was evaluated in the context of the global COVID-19 pandemic, which necessitated consideration that the patient might be at risk for infection with the SARS-CoV-2 virus that causes COVID-19. Institutional protocols and algorithms that pertain to the evaluation of patients at risk for COVID-19 are in a state of rapid change based on information released by regulatory bodies including the CDC and federal and state organizations. These policies and algorithms were followed during the patient's care in the ED.  ____________________________________________   LABS (pertinent positives/negatives)  Labs Reviewed  BASIC METABOLIC PANEL - Abnormal; Notable for the following components:      Result Value   Potassium 2.8 (*)    Glucose, Bld 125 (*)    Calcium 8.8 (*)    All other components within normal limits  CBC WITH DIFFERENTIAL/PLATELET  BRAIN NATRIURETIC PEPTIDE  LIPASE, BLOOD    RADIOLOGY Images were viewed by me  Acute abdominal series IMPRESSION: Negative abdominal radiographs.  No acute cardiopulmonary disease.  ____________________________________________   DIFFERENTIAL DIAGNOSIS   Gastroenteritis, dehydration, electrolyte abnormality, carcinoid syndrome, metastasis  FINAL ASSESSMENT AND PLAN  Abdominal pain, vomiting and diarrhea   Plan: The patient had presented for multiple complaints. Patient's labs revealed mild hypokalemia but no other acute process. Patient's imaging was negative.  Patient has had intractable pain and vomiting here.  She has received several doses of IV antiemetics and pain medicine.  I will discuss with the hospitalist for admission.   Laurence Aly, MD    Note: This note was generated in part or whole with voice recognition software. Voice recognition is usually quite accurate but there are  transcription errors that can and very often do occur. I apologize for any typographical errors that were not detected and corrected.     Earleen Newport, MD 12/23/18 1816

## 2018-12-23 NOTE — Progress Notes (Addendum)
   Advanced care plan. Purpose of the Encounter: CODE STATUS Parties in Attendance: Patient Patient's Decision Capacity: Good Subjective/Patient's story: Brittany Nguyen  is a 53 y.o. female with a known history of carcinoid tumor, chronic congestive heart failure, COPD, DVT in the past, bronchial asthma, endometriosis, gastric cancer presented to the emergency room for nausea and vomiting.  Patient unable to eat anything and drinking of fluids because of the nausea and vomiting.   Objective/Medical story Patient was evaluated in the emergency room.  Potassium level is low.  Appears dehydrated needs IV fluids and potassium supplementation.  Needs IV antiemetics to control nausea and vomiting. Goals of care determination:  Advance care directives goals of care treatment plan discussed Patient wants everything done for now to include CPR, intubation ventilator if the need arises.  Full resuscitation CODE STATUS: Full code Time spent discussing advanced care planning: 16 minutes

## 2018-12-23 NOTE — ED Triage Notes (Signed)
Pt c/o NVD X 3 days. Unable to keep liquids down.  Is cancer pt. Has not started chemo or RT yet.  Pt c/o dry cough and SHOB as well.  Mask applied to pt.  Pain to upper abdomen.  Unlabored but appears in pain. VSS. No fevers.

## 2018-12-23 NOTE — H&P (Signed)
[facility] NAME: Brittany Nguyen    MR#:  [id number]  DATE OF BIRTH:  06-06-1966  DATE OF ADMISSION:  12/23/2018  PRIMARY CARE PHYSICIAN: [name], MD   REQUESTING/REFERRING PHYSICIAN:   CHIEF COMPLAINT:   Chief Complaint  Patient presents with  . Emesis  . Cough  . Shortness of Breath    HISTORY OF PRESENT ILLNESS: Brittany Nguyen  is a 53 y.o. female with a known history of carcinoid tumor, chronic congestive heart failure, COPD, DVT in the past, bronchial asthma, endometriosis, gastric cancer presented to the emergency room for nausea and vomiting.  Patient unable to eat anything and drinking of fluids because of the nausea and vomiting.  Potassium level has been low.  She was worked up with abdominal x-ray which did not show any ileus or obstruction.  Patient follows up in [place] for her carcinoid tumor at [facility].  No complaints of any chest pain.  No complaints of shortness of breath, sore throat, cough and fever.  No recent travel, sick contacts.  PAST MEDICAL HISTORY:   Past Medical History:  Diagnosis Date  . Asthma 2013  . 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 CHF (congestive heart failure) ([place])   . COPD (chronic obstructive pulmonary disease) ([place])   . Diverticulitis 2010  . DVT (deep venous thrombosis) ([place])   . Endometriosis 1990  . Heart disease 2013  . Hx MRSA infection   . Iron deficiency   . Lump or mass in breast   . Restless leg   . Stomach cancer ([place])     PAST SURGICAL HISTORY:  Past Surgical History:  Procedure Laterality Date  . ABDOMINAL HYSTERECTOMY     age 21  . BREAST BIOPSY Right 2014   benign  . CARDIAC DEFIBRILLATOR PLACEMENT    . CARDIAC DEFIBRILLATOR PLACEMENT    . CESAREAN SECTION    . CHOLECYSTECTOMY    . COLECTOMY    . COLONOSCOPY WITH PROPOFOL N/A 02/22/2018   Procedure: COLONOSCOPY WITH PROPOFOL;  Surgeon: Toledo, Benay Pike, MD;  Location: [facility] ENDOSCOPY;   Service: Gastroenterology;  Laterality: N/A;  . ESOPHAGOGASTRODUODENOSCOPY (EGD) WITH PROPOFOL N/A 02/22/2018   Procedure: ESOPHAGOGASTRODUODENOSCOPY (EGD) WITH PROPOFOL;  Surgeon: Toledo, Benay Pike, MD;  Location: [facility] ENDOSCOPY;  Service: Gastroenterology;  Laterality: N/A;  . NASAL SINUS SURGERY  2012  . OOPHORECTOMY      SOCIAL HISTORY:  Social History   Tobacco Use  . Smoking status: Never Smoker  . Smokeless tobacco: Never Used  Substance Use Topics  . Alcohol use: No    FAMILY HISTORY:  Family History  Problem Relation Age of Onset  . Cancer Mother 30       ovarian  . CAD Mother   . Cancer Father 7       brain  . CAD Father   . Cancer Daughter 18       skin  . Cancer Maternal Aunt 34       breast  . Leukemia Paternal Grandfather     DRUG ALLERGIES:  Allergies  Allergen Reactions  . Contrast Media [Iodinated Diagnostic Agents] Shortness Of Breath    Per CT report in 2014 pt had break through contrast reaction with hives and SOB following 13 hour prep. MSY   . Metrizamide Shortness Of Breath  . Penicillins Hives and Other (See Comments)    Other reaction(s): Other (See Comments) Has patient had a PCN  reaction causing immediate rash, facial/tongue/throat swelling, SOB or lightheadedness with hypotension: Yes Has patient had a PCN reaction causing severe rash involving mucus membranes or skin necrosis: No Has patient had a PCN reaction that required hospitalization No Has patient had a PCN reaction occurring within the last 10 years: No If all of the above answers are "NO", then may proceed with Cephalosporin use. Has patient had a PCN reaction causing immediate rash, facial/tongue/throat swelling, SOB or lightheadedness with hypotension: Yes Has patient had a PCN reaction causing severe rash involving mucus membranes or skin necrosis: No Has patient had a PCN reaction that required hospitalization No Has patient had a PCN reaction occurring within the last 10  years: No If all of the above answers are "NO", then may proceed with Cephalosporin use.   . Isosorbide Nitrate     Other reaction(s): Headache  . Latex Hives  . Lidocaine Hives  . Ondansetron Other (See Comments)    Severe headache  . Zofran [Ondansetron Hcl] Other (See Comments)    Causes migraines  . Hydromorphone Rash  . Povidone-Iodine Rash    Blistering rash  . Pulmicort [Budesonide] Itching    REVIEW OF SYSTEMS:   CONSTITUTIONAL: No fever,has fatigue and weakness.  EYES: No blurred or double vision.  EARS, NOSE, AND THROAT: No tinnitus or ear pain.  RESPIRATORY: No cough, shortness of breath, wheezing or hemoptysis.  CARDIOVASCULAR: No chest pain, orthopnea, edema.  GASTROINTESTINAL: Has nausea, vomiting, no diarrhea  Has abdominal pain.  GENITOURINARY: No dysuria, hematuria.  ENDOCRINE: No polyuria, nocturia,  HEMATOLOGY: No anemia, easy bruising or bleeding SKIN: No rash or lesion. MUSCULOSKELETAL: No joint pain or arthritis.   NEUROLOGIC: No tingling, numbness, weakness.  PSYCHIATRY: No anxiety or depression.   MEDICATIONS AT HOME:  Prior to Admission medications   Medication Sig Start Date End Date Taking? Authorizing Provider  albuterol (VENTOLIN HFA) 108 (90 Base) MCG/ACT inhaler Inhale 2 puffs into the lungs every 6 (six) hours as needed for shortness of breath. 08/28/16  Yes [provider]  busPIRone (BUSPAR) 15 MG tablet Take 15 mg by mouth 2 (two) times daily. 05/08/18  Yes [provider]  fentaNYL (DURAGESIC) 50 MCG/HR Place 1 patch onto the skin every 3 (three) days. 10/08/18  Yes Epifanio Lesches, MD  furosemide (LASIX) 40 MG tablet Take 40 mg by mouth 2 (two) times daily. 07/09/18  Yes [provider]  levothyroxine (SYNTHROID, LEVOTHROID) 150 MCG tablet Take 150 mcg by mouth daily before breakfast.   Yes [provider]  losartan (COZAAR) 25 MG tablet Take 25 mg by mouth daily.    Yes [provider]   metoprolol succinate (TOPROL-XL) 50 MG 24 hr tablet Take 100 mg by mouth daily. 10/08/18 10/08/19 Yes [provider]  omeprazole (PRILOSEC) 40 MG capsule Take 40 mg by mouth 2 (two) times daily. 10/19/18  Yes [provider]  oxyCODONE-acetaminophen (PERCOCET/ROXICET) 5-325 MG tablet Take 1-2 tablets by mouth every 8 (eight) hours as needed for moderate pain. 10/05/18  Yes Epifanio Lesches, MD  rOPINIRole (REQUIP) 2 MG tablet Take 2 tablets (4 mg total) by mouth at bedtime. 2-4  Mg per night Patient taking differently: Take 4 mg by mouth at bedtime.  01/16/18  Yes Wieting, Richard, MD  spironolactone (ALDACTONE) 50 MG tablet Take 50 mg by mouth daily. 11/26/18  Yes [provider]  venlafaxine XR (EFFEXOR-XR) 75 MG 24 hr capsule Take 75 mg by mouth daily. 11/14/18 11/14/19 Yes [provider]  albuterol (ACCUNEB) 1.25 MG/3ML nebulizer solution Take 3 mLs by nebulization every 6 (six) hours as needed for wheezing. 11/09/17   [provider]  baclofen (LIORESAL) 10 MG tablet Take 10 mg by mouth 2 (two) times daily. 06/11/18   [provider]  beclomethasone (QVAR) 80 MCG/ACT inhaler Inhale 2 puffs into the lungs 2 (two) times daily. 06/05/17 10/03/18  Laverle Hobby, MD  meloxicam (MOBIC) 15 MG tablet Take 15 mg by mouth daily. 08/30/18   [provider]  nitroGLYCERIN (NITROSTAT) 0.4 MG SL tablet Place 1 tablet (0.4 mg total) under the tongue every 5 (five) minutes as needed for chest pain. 01/24/17   Henreitta Leber, MD  potassium chloride SA (KLOR-CON M20) 20 MEQ tablet Take 1 tablet (20 mEq total) by mouth 2 (two) times daily for 7 days. Patient taking differently: Take 40 mEq by mouth daily as needed. 1 tablet twice a day 06/17/18 10/03/18  Hinda Kehr, MD  promethazine (PHENERGAN) 25 MG tablet Take 1 tablet (25 mg total) by mouth every 6 (six) hours as needed for nausea or vomiting. 10/05/18   Epifanio Lesches, MD  traMADol (ULTRAM)  50 MG tablet Take 50 mg by mouth every 6 (six) hours as needed.    [provider]      PHYSICAL EXAMINATION:   VITAL SIGNS: Blood pressure 113/69, pulse 94, temperature 98.1 F (36.7 C), temperature source Oral, resp. rate 18, height 5\' 5"  (1.651 m), weight 90.7 kg, last menstrual period 01/22/2012, SpO2 98 %.  GENERAL:  53 y.o.-year-old patient lying in the bed with no acute distress.  EYES: Pupils equal, round, reactive to light and accommodation. No scleral icterus. Extraocular muscles intact.  HEENT: Head atraumatic, normocephalic. Oropharynx and nasopharynx clear.  NECK:  Supple, no jugular venous distention. No thyroid enlargement, no tenderness.  LUNGS: Normal breath sounds bilaterally, no wheezing, rales,rhonchi or crepitation. No use of accessory muscles of respiration.  CARDIOVASCULAR: S1, S2 normal. No murmurs, rubs, or gallops.  ABDOMEN: Soft, nontender, nondistended. Bowel sounds present. No organomegaly or mass.  EXTREMITIES: No pedal edema, cyanosis, or clubbing.  NEUROLOGIC: Cranial nerves II through XII are intact. Muscle strength 5/5 in all extremities. Sensation intact. Gait not checked.  PSYCHIATRIC: The patient is alert and oriented x 3.  SKIN: No obvious rash, lesion, or ulcer.   LABORATORY PANEL:   CBC Recent Labs  Lab 12/23/18 1559  WBC 7.7  HGB 13.3  HCT 37.2  PLT 252  MCV 87.1  MCH 31.1  MCHC 35.8  RDW 12.9  LYMPHSABS 3.0  MONOABS 0.6  EOSABS 0.3  BASOSABS 0.0   ------------------------------------------------------------------------------------------------------------------  Chemistries  Recent Labs  Lab 12/23/18 1559  NA 138  K 2.8*  CL 100  CO2 30  GLUCOSE 125*  BUN 13  CREATININE 0.97  CALCIUM 8.8*   ------------------------------------------------------------------------------------------------------------------ estimated creatinine clearance is 77.2 mL/min (by C-G formula based on SCr of 0.97  mg/dL). ------------------------------------------------------------------------------------------------------------------ No results for input(s): TSH, T4TOTAL, T3FREE, THYROIDAB in the last 72 hours.  Invalid input(s): FREET3   Coagulation profile No results for input(s): INR, PROTIME in the last 168 hours. ------------------------------------------------------------------------------------------------------------------- No results for input(s): DDIMER in the last 72 hours. -------------------------------------------------------------------------------------------------------------------  Cardiac Enzymes No results for input(s): CKMB, TROPONINI, MYOGLOBIN in the last 168 hours.  Invalid input(s): CK ------------------------------------------------------------------------------------------------------------------ Invalid input(s): POCBNP  ---------------------------------------------------------------------------------------------------------------  Urinalysis    Component Value Date/Time   COLORURINE STRAW (A) 06/22/2018 2241   APPEARANCEUR CLEAR (A) 06/22/2018 2241   APPEARANCEUR Clear 09/02/2014 2312  LABSPEC 1.008 06/22/2018 2241   LABSPEC 1.013 09/02/2014 2312   PHURINE 5.0 06/22/2018 2241   GLUCOSEU NEGATIVE 06/22/2018 2241   GLUCOSEU Negative 09/02/2014 2312   HGBUR NEGATIVE 06/22/2018 [place] 06/22/2018 2241   BILIRUBINUR Negative 09/02/2014 [place] 06/22/2018 [place] 06/22/2018 2241   NITRITE NEGATIVE 06/22/2018 2241   LEUKOCYTESUR NEGATIVE 06/22/2018 2241   LEUKOCYTESUR Negative 09/02/2014 2312     RADIOLOGY: Dg Abdomen Acute W/chest  Result Date: 12/23/2018 CLINICAL DATA:  Abdominal pain, nausea, vomiting, diarrhea for 3 days EXAM: DG ABDOMEN ACUTE W/ 1V CHEST COMPARISON:  None. FINDINGS: There is no evidence of dilated bowel loops or free intraperitoneal air. No radiopaque calculi or other significant  radiographic abnormality is seen. Heart size and mediastinal contours are within normal limits. Dual lead cardiac pacemaker. Both lungs are clear. IMPRESSION: Negative abdominal radiographs.  No acute cardiopulmonary disease. Electronically Signed   By: Kathreen Devoid   On: 12/23/2018 15:56    EKG: Orders placed or performed during the hospital encounter of 12/23/18  . EKG 12-Lead  . EKG 12-Lead  . ED EKG  . ED EKG    IMPRESSION AND PLAN: 53 year old female patient with a known history of carcinoid tumor, chronic congestive heart failure, COPD, DVT in the past, bronchial asthma, endometriosis, gastric cancer presented to the emergency room for nausea and vomiting.   -Intractable nausea vomiting Admit patient to medical floor Antiemetics intravenously  -Dehydration IV fluids  -Acute hypokalemia Replace potassium intravenously aggressively  -History of carcinoid tumor Outpatient follow-up  -DVT prophylaxis subcu Lovenox daily  -History of bronchial asthma Stable  All the records are reviewed and case discussed with ED provider. Management plans discussed with the patient, family and they are in agreement.  CODE STATUS: Full code Code Status History    Date Active Date Inactive Code Status Order ID Comments User Context   10/03/2018 2203 10/05/2018 1612 Full Code [id number]  Vaughan Basta, MD Inpatient   02/20/2018 2355 02/23/2018 1702 Full Code [id number]  Lance Coon, MD Inpatient   01/30/2018 0145 02/01/2018 1608 Full Code [id number]  Arta Silence, MD Inpatient   01/30/2018 0145 01/30/2018 0145 Full Code [id number]  Arta Silence, MD Inpatient   01/14/2018 1553 01/16/2018 1636 Full Code [id number]  Bettey Costa, MD Inpatient   01/08/2018 2351 01/11/2018 2010 Full Code [id number]  Amelia Jo, MD ED   10/30/2017 0113 11/01/2017 1421 Full Code [id number]  Lance Coon, MD ED   09/24/2017 0848 09/27/2017 1409 Full Code [id number]  Loletha Grayer, MD ED   07/03/2017 0654  07/05/2017 1248 Full Code [id number]  Saundra Shelling, MD ED   04/25/2017 0146 04/26/2017 1516 Full Code [id number]  Porum, Lastrup, DO ED   01/28/2017 1033 01/30/2017 1741 Full Code [id number]  [name], MD Inpatient   01/22/2017 0114 01/24/2017 1759 Full Code [id number]  Lance Coon, MD Inpatient   12/09/2016 0209 12/09/2016 1507 Full Code [id number]  Lance Coon, MD Inpatient   11/21/2015 0650 11/22/2015 1550 Full Code [id number]  Saundra Shelling, MD Inpatient   09/19/2015 0511 09/21/2015 1829 Full Code [id number]  Harrie Foreman, MD Inpatient     TOTAL TIME TAKING CARE OF THIS PATIENT: 54 minutes.    Saundra Shelling M.D on 12/23/2018 at 6:57 PM  Between 7am to 6pm - Pager - [phone]  After 6pm go to [email or web address] - password [email or web address] [facility]  Office  [phone]  CC: Primary care physician; [name],  MD     

## 2018-12-23 NOTE — ED Notes (Signed)
ED TO INPATIENT HANDOFF REPORT  ED Nurse Name and Phone #: [phone]  S Name/Age/Gender Brittany Nguyen 52 y.o. female Room/Bed: ED35A/ED35A  Code Status   Code Status: Prior  Home/SNF/Other Home Patient oriented to: self, place, time and situation Is this baseline? Yes   Triage Complete: Triage complete  Chief Complaint NVD  Triage Note Pt c/o NVD X 3 days. Unable to keep liquids down.  Is cancer pt. Has not started chemo or RT yet.  Pt c/o dry cough and SHOB as well.  Mask applied to pt.  Pain to upper abdomen.  Unlabored but appears in pain. VSS. No fevers.    Allergies Allergies  Allergen Reactions  . Contrast Media [Iodinated Diagnostic Agents] Shortness Of Breath    Per CT report in 2014 pt had break through contrast reaction with hives and SOB following 13 hour prep. MSY   . Metrizamide Shortness Of Breath  . Penicillins Hives and Other (See Comments)    Other reaction(s): Other (See Comments) Has patient had a PCN reaction causing immediate rash, facial/tongue/throat swelling, SOB or lightheadedness with hypotension: Yes Has patient had a PCN reaction causing severe rash involving mucus membranes or skin necrosis: No Has patient had a PCN reaction that required hospitalization No Has patient had a PCN reaction occurring within the last 10 years: No If all of the above answers are "NO", then may proceed with Cephalosporin use. Has patient had a PCN reaction causing immediate rash, facial/tongue/throat swelling, SOB or lightheadedness with hypotension: Yes Has patient had a PCN reaction causing severe rash involving mucus membranes or skin necrosis: No Has patient had a PCN reaction that required hospitalization No Has patient had a PCN reaction occurring within the last 10 years: No If all of the above answers are "NO", then may proceed with Cephalosporin use.   . Isosorbide Nitrate     Other reaction(s): Headache  . Latex Hives  . Lidocaine Hives  .  Ondansetron Other (See Comments)    Severe headache  . Zofran [Ondansetron Hcl] Other (See Comments)    Causes migraines  . Hydromorphone Rash  . Povidone-Iodine Rash    Blistering rash  . Pulmicort [Budesonide] Itching    Level of Care/Admitting Diagnosis ED Disposition    ED Disposition Condition [facility] Area: [place] [[id number]]  Level of Care: Med-Surg [16]  Covid Evaluation: N/A  Diagnosis: Hypokalemia [147829]  Admitting Physician: Saundra Shelling [[id number]]  Attending Physician: Saundra Shelling [[id number]]  Estimated length of stay: past midnight tomorrow  Certification:: I certify this patient will need inpatient services for at least 2 midnights  PT Class (Do Not Modify): Inpatient [101]  PT Acc Code (Do Not Modify): Private [1]       B Medical/Surgery History Past Medical History:  Diagnosis Date  . Asthma 2013  . 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 CHF (congestive heart failure) ([place])   . COPD (chronic obstructive pulmonary disease) ([place])   . Diverticulitis 2010  . DVT (deep venous thrombosis) ([place])   . Endometriosis 1990  . Heart disease 2013  . Hx MRSA infection   . Iron deficiency   . Lump or mass in breast   . Restless leg   . Stomach cancer [facility])    Past Surgical History:  Procedure Laterality Date  . ABDOMINAL HYSTERECTOMY     age 61  . BREAST BIOPSY Right 2014   benign  . CARDIAC DEFIBRILLATOR PLACEMENT    .  CARDIAC DEFIBRILLATOR PLACEMENT    . CESAREAN SECTION    . CHOLECYSTECTOMY    . COLECTOMY    . COLONOSCOPY WITH PROPOFOL N/A 02/22/2018   Procedure: COLONOSCOPY WITH PROPOFOL;  Surgeon: Toledo, Benay Pike, MD;  Location: [facility] ENDOSCOPY;  Service: Gastroenterology;  Laterality: N/A;  . ESOPHAGOGASTRODUODENOSCOPY (EGD) WITH PROPOFOL N/A 02/22/2018   Procedure: ESOPHAGOGASTRODUODENOSCOPY (EGD) WITH PROPOFOL;  Surgeon: Toledo, Benay Pike, MD;  Location: [facility] ENDOSCOPY;  Service: Gastroenterology;   Laterality: N/A;  . NASAL SINUS SURGERY  2012  . OOPHORECTOMY       A IV Location/Drains/Wounds Patient Lines/Drains/Airways Status   Active Line/Drains/Airways    Name:   Placement date:   Placement time:   Site:   Days:   Peripheral IV 12/23/18 Left Antecubital   12/23/18    1554    Antecubital   less than 1          Intake/Output Last 24 hours No intake or output data in the 24 hours ending 12/23/18 1859  Labs/Imaging Results for orders placed or performed during the hospital encounter of 12/23/18 (from the past 48 hour(s))  CBC with Differential     Status: None   Collection Time: 12/23/18  3:59 PM  Result Value Ref Range   WBC 7.7 4.0 - 10.5 K/uL   RBC 4.27 3.87 - 5.11 MIL/uL   Hemoglobin 13.3 12.0 - 15.0 g/dL   HCT 37.2 36.0 - 46.0 %   MCV 87.1 80.0 - 100.0 fL   MCH 31.1 26.0 - 34.0 pg   MCHC 35.8 30.0 - 36.0 g/dL   RDW 12.9 11.5 - 15.5 %   Platelets 252 150 - 400 K/uL   nRBC 0.0 0.0 - 0.2 %   Neutrophils Relative % 50 %   Neutro Abs 3.8 1.7 - 7.7 K/uL   Lymphocytes Relative 40 %   Lymphs Abs 3.0 0.7 - 4.0 K/uL   Monocytes Relative 7 %   Monocytes Absolute 0.6 0.1 - 1.0 K/uL   Eosinophils Relative 3 %   Eosinophils Absolute 0.3 0.0 - 0.5 K/uL   Basophils Relative 0 %   Basophils Absolute 0.0 0.0 - 0.1 K/uL   Immature Granulocytes 0 %   Abs Immature Granulocytes 0.02 0.00 - 0.07 K/uL    Comment: Performed at [facility], [address] [place], [place] [address]  Basic metabolic panel     Status: Abnormal   Collection Time: 12/23/18  3:59 PM  Result Value Ref Range   Sodium 138 135 - 145 mmol/L   Potassium 2.8 (L) 3.5 - 5.1 mmol/L   Chloride 100 98 - 111 mmol/L   CO2 30 22 - 32 mmol/L   Glucose, Bld 125 (H) 70 - 99 mg/dL   BUN 13 6 - 20 mg/dL   Creatinine, Ser 0.97 0.44 - 1.00 mg/dL   Calcium 8.8 (L) 8.9 - 10.3 mg/dL   GFR calc non Af Amer >60 >60 mL/min   GFR calc Af Amer >60 >60 mL/min   Anion gap 8 5 - 15    Comment: Performed at  [facility], [address] [place], [place] [address]  Brain natriuretic peptide     Status: None   Collection Time: 12/23/18  3:59 PM  Result Value Ref Range   B Natriuretic Peptide 29.0 0.0 - 100.0 pg/mL    Comment: Performed at [facility], [address] [place], [place] [address]  Lipase, blood     Status: None  Collection Time: 12/23/18  3:59 PM  Result Value Ref Range   Lipase 31 11 - 51 U/L    Comment: Performed at [facility], [address] [place], [place] [address]   Dg Abdomen Acute W/chest  Result Date: 12/23/2018 CLINICAL DATA:  Abdominal pain, nausea, vomiting, diarrhea for 3 days EXAM: DG ABDOMEN ACUTE W/ 1V CHEST COMPARISON:  None. FINDINGS: There is no evidence of dilated bowel loops or free intraperitoneal air. No radiopaque calculi or other significant radiographic abnormality is seen. Heart size and mediastinal contours are within normal limits. Dual lead cardiac pacemaker. Both lungs are clear. IMPRESSION: Negative abdominal radiographs.  No acute cardiopulmonary disease. Electronically Signed   By: Kathreen Devoid   On: 12/23/2018 15:56    Pending Labs Unresulted Labs (From admission, onward)    Start     Ordered   Signed and Held  CBC  (enoxaparin (LOVENOX)    CrCl >/= 30 ml/min)  Once,   R    Comments:  Baseline for enoxaparin therapy IF NOT ALREADY DRAWN.  Notify MD if PLT < 100 K.    Signed and Held   Signed and Held  Creatinine, serum  (enoxaparin (LOVENOX)    CrCl >/= 30 ml/min)  Once,   R    Comments:  Baseline for enoxaparin therapy IF NOT ALREADY DRAWN.    Signed and Held   Signed and Held  Creatinine, serum  (enoxaparin (LOVENOX)    CrCl >/= 30 ml/min)  Weekly,   R    Comments:  while on enoxaparin therapy    Signed and Held   Signed and Held  Basic metabolic panel  Tomorrow morning,   R     Signed and Held          Vitals/Pain Today's Vitals   12/23/18 1507 12/23/18 1510 12/23/18 1811 12/23/18 1811  BP:   (!) 110/57  113/69  Pulse:  (!) 103  94  Resp:  18  18  Temp:  98.1 F (36.7 C)    TempSrc:  Oral    SpO2:  95%  98%  Weight: 90.7 kg     Height: 5\' 5"  (1.651 m)     PainSc: 10-Worst pain ever  8      Isolation Precautions No active isolations  Medications Medications  0.9 %  sodium chloride infusion ( Intravenous New Bag/Given 12/23/18 1555)  promethazine (PHENERGAN) injection 25 mg (25 mg Intravenous Given 12/23/18 1556)  HYDROmorphone (DILAUDID) injection 1 mg (1 mg Intravenous Given 12/23/18 1556)  metoCLOPramide (REGLAN) injection 10 mg (10 mg Intravenous Given 12/23/18 1807)  HYDROmorphone (DILAUDID) injection 0.5 mg (0.5 mg Intravenous Given 12/23/18 1807)    Mobility walks Low fall risk   Focused Assessments Pulmonary Assessment Handoff:  Lung sounds:   O2 Device: Room Air        R Recommendations: See Admitting Provider Note  Report given to:   Additional Notes:

## 2018-12-23 NOTE — ED Notes (Signed)
RN Deadrice made aware of pt in room

## 2018-12-24 LAB — BASIC METABOLIC PANEL
Anion gap: 7 (ref 5–15)
BUN: 12 mg/dL (ref 6–20)
CO2: 28 mmol/L (ref 22–32)
Calcium: 8.3 mg/dL — ABNORMAL LOW (ref 8.9–10.3)
Chloride: 107 mmol/L (ref 98–111)
Creatinine, Ser: 0.79 mg/dL (ref 0.44–1.00)
GFR calc Af Amer: >60 mL/min (ref >60)
GFR calc non Af Amer: >60 mL/min (ref >60)
Glucose, Bld: 108 mg/dL — ABNORMAL HIGH (ref 70–99)
Potassium: 3.5 mmol/L (ref 3.5–5.1)
Sodium: 142 mmol/L (ref 135–145)

## 2018-12-24 MED ORDER — BACLOFEN 10 MG PO TABS
10.0000 mg | ORAL_TABLET | Freq: Two times a day (BID) | ORAL | Status: DC
  Administered 2018-12-24: 10 mg via ORAL
  Filled 2018-12-24 (×2): qty 1

## 2018-12-24 MED ORDER — BUDESONIDE 0.25 MG/2ML IN SUSP
0.2500 mg | Freq: Two times a day (BID) | RESPIRATORY_TRACT | Status: DC
  Administered 2018-12-24: 12:00:00 0.25 mg via RESPIRATORY_TRACT
  Filled 2018-12-24: qty 2

## 2018-12-24 MED ORDER — PANTOPRAZOLE SODIUM 40 MG PO TBEC
40.0000 mg | DELAYED_RELEASE_TABLET | Freq: Every day | ORAL | Status: DC
  Administered 2018-12-24: 40 mg via ORAL
  Filled 2018-12-24: qty 1

## 2018-12-24 MED ORDER — ALBUTEROL SULFATE (2.5 MG/3ML) 0.083% IN NEBU: 3.0000 mL | INHALATION_SOLUTION | Freq: Four times a day (QID) | RESPIRATORY_TRACT | Status: DC | PRN

## 2018-12-24 MED ORDER — LEVOTHYROXINE SODIUM 150 MCG PO TABS: 150.0000 ug | ORAL_TABLET | Freq: Every day | ORAL | Status: DC

## 2018-12-24 MED ORDER — POTASSIUM CHLORIDE CRYS ER 20 MEQ PO TBCR
20.0000 meq | EXTENDED_RELEASE_TABLET | Freq: Two times a day (BID) | ORAL | Status: DC
  Administered 2018-12-24: 11:00:00 20 meq via ORAL
  Filled 2018-12-24: qty 1

## 2018-12-24 MED ORDER — OXYCODONE-ACETAMINOPHEN 5-325 MG PO TABS: 1.0000 | ORAL_TABLET | Freq: Three times a day (TID) | ORAL | Status: DC | PRN

## 2018-12-24 MED ORDER — PROMETHAZINE HCL 25 MG PO TABS
25.0000 mg | ORAL_TABLET | Freq: Four times a day (QID) | ORAL | Status: DC | PRN
  Filled 2018-12-24: qty 1

## 2018-12-24 MED ORDER — METOPROLOL SUCCINATE ER 50 MG PO TB24
50.0000 mg | ORAL_TABLET | Freq: Every day | ORAL | Status: DC
  Administered 2018-12-24: 08:00:00 50 mg via ORAL
  Filled 2018-12-24: qty 1

## 2018-12-24 MED ORDER — MELOXICAM 7.5 MG PO TABS
15.0000 mg | ORAL_TABLET | Freq: Every day | ORAL | Status: DC
  Administered 2018-12-24: 11:00:00 15 mg via ORAL
  Filled 2018-12-24: qty 2

## 2018-12-24 MED ORDER — TRAMADOL HCL 50 MG PO TABS: 50.0000 mg | ORAL_TABLET | Freq: Four times a day (QID) | ORAL | Status: DC | PRN

## 2018-12-24 MED ORDER — VENLAFAXINE HCL ER 75 MG PO CP24
75.0000 mg | ORAL_CAPSULE | Freq: Every day | ORAL | Status: DC
  Administered 2018-12-24: 75 mg via ORAL
  Filled 2018-12-24: qty 1

## 2018-12-24 MED ORDER — FENTANYL 50 MCG/HR TD PT72
1.0000 | MEDICATED_PATCH | TRANSDERMAL | Status: DC
  Administered 2018-12-24: 1 via TRANSDERMAL
  Filled 2018-12-24: qty 1

## 2018-12-24 MED ORDER — ALBUTEROL SULFATE HFA 108 (90 BASE) MCG/ACT IN AERS: 2.0000 | INHALATION_SPRAY | Freq: Four times a day (QID) | RESPIRATORY_TRACT | Status: DC | PRN

## 2018-12-24 MED ORDER — BUSPIRONE HCL 15 MG PO TABS
15.0000 mg | ORAL_TABLET | Freq: Two times a day (BID) | ORAL | Status: DC
  Administered 2018-12-24: 15 mg via ORAL
  Filled 2018-12-24 (×2): qty 1

## 2018-12-24 NOTE — Discharge Instructions (Signed)
Dehydration, Adult  Dehydration is when there is not enough fluid or water in your body. This happens when you lose more fluids than you take in. Dehydration can range from mild to very bad. It should be treated right away to keep it from getting very bad. Symptoms of mild dehydration may include:  Thirst.  Dry lips.  Slightly dry mouth.  Dry, warm skin.  Dizziness. Symptoms of moderate dehydration may include:  Very dry mouth.  Muscle cramps.  Dark pee (urine). Pee may be the color of tea.  Your body making less pee.  Your eyes making fewer tears.  Heartbeat that is uneven or faster than normal (palpitations).  Headache.  Light-headedness, especially when you stand up from sitting.  Fainting (syncope). Symptoms of very bad dehydration may include:  Changes in skin, such as: ? Cold and clammy skin. ? Blotchy (mottled) or pale skin. ? Skin that does not quickly return to normal after being lightly pinched and let go (poor skin turgor).  Changes in body fluids, such as: ? Feeling very thirsty. ? Your eyes making fewer tears. ? Not sweating when body temperature is high, such as in hot weather. ? Your body making very little pee.  Changes in vital signs, such as: ? Weak pulse. ? Pulse that is more than 100 beats a minute when you are sitting still. ? Fast breathing. ? Low blood pressure.  Other changes, such as: ? Sunken eyes. ? Cold hands and feet. ? Confusion. ? Lack of energy (lethargy). ? Trouble waking up from sleep. ? Short-term weight loss. ? Unconsciousness. Follow these instructions at home:   If told by your doctor, drink an ORS: ? Make an ORS by using instructions on the package. ? Start by drinking small amounts, about  cup (120 mL) every 5-10 minutes. ? Slowly drink more until you have had the amount that your doctor said to have.  Drink enough clear fluid to keep your pee clear or pale yellow. If you were told to drink an ORS, finish the  ORS first, then start slowly drinking clear fluids. Drink fluids such as: ? Water. Do not drink only water by itself. Doing that can make the salt (sodium) level in your body get too low (hyponatremia). ? Ice chips. ? Fruit juice that you have added water to (diluted). ? Low-calorie sports drinks.  Avoid: ? Alcohol. ? Drinks that have a lot of sugar. These include high-calorie sports drinks, fruit juice that does not have water added, and soda. ? Caffeine. ? Foods that are greasy or have a lot of fat or sugar.  Take over-the-counter and prescription medicines only as told by your doctor.  Do not take salt tablets. Doing that can make the salt level in your body get too high (hypernatremia).  Eat foods that have minerals (electrolytes). Examples include bananas, oranges, potatoes, tomatoes, and spinach.  Keep all follow-up visits as told by your doctor. This is important. Contact a doctor if:  You have belly (abdominal) pain that: ? Gets worse. ? Stays in one area (localizes).  You have a rash.  You have a stiff neck.  You get angry or annoyed more easily than normal (irritability).  You are more sleepy than normal.  You have a harder time waking up than normal.  You feel: ? Weak. ? Dizzy. ? Very thirsty.  You have peed (urinated) only a small amount of very dark pee during 6-8 hours. Get help right away if:  You have  symptoms of very bad dehydration.  You cannot drink fluids without throwing up (vomiting).  Your symptoms get worse with treatment.  You have a fever.  You have a very bad headache.  You are throwing up or having watery poop (diarrhea) and it: ? Gets worse. ? Does not go away.  You have blood or something green (bile) in your throw-up.  You have blood in your poop (stool). This may cause poop to look black and tarry.  You have not peed in 6-8 hours.  You pass out (faint).  Your heart rate when you are sitting still is more than 100 beats a  minute.  You have trouble breathing. This information is not intended to replace advice given to you by your health care provider. Make sure you discuss any questions you have with your health care provider. Document Released: 06/18/2009 Document Revised: 03/11/2016 Document Reviewed: 10/16/2015 Elsevier Interactive Patient Education  2019 [place]. Hypokalemia Hypokalemia means that the amount of potassium in the blood is lower than normal.Potassium is a chemical that helps regulate the amount of fluid in the body (electrolyte). It also stimulates muscle tightening (contraction) and helps nerves work properly.Normally, most of the bodys potassium is inside of cells, and only a very small amount is in the blood. Because the amount in the blood is so small, minor changes to potassium levels in the blood can be life-threatening. What are the causes? This condition may be caused by:  Antibiotic medicine.  Diarrhea or vomiting. Taking too much of a medicine that helps you have a bowel movement (laxative) can cause diarrhea and lead to hypokalemia.  Chronic kidney disease (CKD).  Medicines that help the body get rid of excess fluid (diuretics).  Eating disorders, such as bulimia.  Low magnesium levels in the body.  Sweating a lot. What are the signs or symptoms? Symptoms of this condition include:  Weakness.  Constipation.  Fatigue.  Muscle cramps.  Mental confusion.  Skipped heartbeats or irregular heartbeat (palpitations).  Tingling or numbness. How is this diagnosed? This condition is diagnosed with a blood test. How is this treated? Hypokalemia can be treated by taking potassium supplements by mouth or adjusting the medicines that you take. Treatment may also include eating more foods that contain a lot of potassium. If your potassium level is very low, you may need to get potassium through an IV tube in one of your veins and be monitored in the hospital. Follow  these instructions at home:   Take over-the-counter and prescription medicines only as told by your health care provider. This includes vitamins and supplements.  Eat a healthy diet. A healthy diet includes fresh fruits and vegetables, whole grains, healthy fats, and lean proteins.  If instructed, eat more foods that contain a lot of potassium, such as: ? Nuts, such as peanuts and pistachios. ? Seeds, such as sunflower seeds and pumpkin seeds. ? Peas, lentils, and lima beans. ? Whole grain and bran cereals and breads. ? Fresh fruits and vegetables, such as apricots, avocado, bananas, cantaloupe, kiwi, oranges, tomatoes, asparagus, and potatoes. ? Orange juice. ? Tomato juice. ? Red meats. ? Yogurt.  Keep all follow-up visits as told by your health care provider. This is important. Contact a health care provider if:  You have weakness that gets worse.  You feel your heart pounding or racing.  You vomit.  You have diarrhea.  You have diabetes (diabetes mellitus) and you have trouble keeping your blood sugar (glucose) in your target  range. Get help right away if:  You have chest pain.  You have shortness of breath.  You have vomiting or diarrhea that lasts for more than 2 days.  You faint. This information is not intended to replace advice given to you by your health care provider. Make sure you discuss any questions you have with your health care provider. Document Released: 08/22/2005 Document Revised: 04/09/2016 Document Reviewed: 04/09/2016 Elsevier Interactive Patient Education  2019 [employer].

## 2018-12-24 NOTE — Discharge Summary (Signed)
[place] [facility] [place] NAME: Brittany Nguyen    MR#:  [id number]  DATE OF BIRTH:  Apr 18, 1966  DATE OF ADMISSION:  12/23/2018   ADMITTING PHYSICIAN: Saundra Shelling, MD  DATE OF DISCHARGE: No discharge date for patient encounter.  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    CHF (congestive heart failure) (HCC)    COPD (chronic obstructive pulmonary disease) ([place])    Diverticulitis 2010   DVT (deep venous thrombosis) ([place])    Endometriosis 1990   Heart disease 2013   Hx MRSA infection    Iron deficiency    Lump or mass in breast    Restless leg    Stomach cancer [facility])     HOSPITAL COURSE:  53 year old female patient with a known history of carcinoid tumor, chronic congestive heart failure, COPD, DVT in the past, bronchial asthma, endometriosis, gastric cancer presented with chief complaints of nausea and vomiting, inability to tolerate p.o. intake, dry cough and shortness of breath.  Labs revealed hypokalemia otherwise unremarkable.  Abdominal x-ray normal with no evidence of ileus or obstruction.  1. Intractable nausea and vomiting in a patient with hx of gastric cancer; resolved with IV fluids and as needed antiemetics.  2.  Hypokalemia -likely due to volume loss from the digestive tract as a result of vomiting -Improved with replacement. Levels 3.5 at discharge -Continue potassium oral supplements at home dose  3. History of carcinoid tumor Outpatient follow-up with [facility]  4. History of bronchial asthma- Stable at discharge   DISCHARGE CONDITIONS:   stable CONSULTS OBTAINED:     DRUG  ALLERGIES:   Allergies  Allergen Reactions   Contrast Media [Iodinated Diagnostic Agents] Shortness Of Breath    Per CT report in 2014 pt had break through contrast reaction with hives and SOB following 13 hour prep. MSY    Metrizamide Shortness Of Breath   Penicillins Hives and Other (See Comments)    Other reaction(s): Other (See Comments) Has patient had a PCN reaction causing immediate rash, facial/tongue/throat swelling, SOB or lightheadedness with hypotension: Yes Has patient had a PCN reaction causing severe rash involving mucus membranes or skin necrosis: No Has patient had a PCN reaction that required hospitalization No Has patient had a PCN reaction occurring within the last 10 years: No If all of the above answers are "NO", then may proceed with Cephalosporin use. Has patient had a PCN reaction causing immediate rash, facial/tongue/throat swelling, SOB or lightheadedness with hypotension: Yes Has patient had a PCN reaction causing severe rash involving mucus membranes or skin necrosis: No Has patient had a PCN reaction that required hospitalization No Has patient had a PCN reaction occurring within the last 10 years: No If all of the above answers are "NO", then may proceed with Cephalosporin use.    Isosorbide Nitrate     Other reaction(s): Headache   Latex Hives   Lidocaine Hives   Ondansetron Other (See Comments)    Severe headache   Zofran [Ondansetron Hcl] Other (See Comments)    Causes migraines   Hydromorphone Rash   Povidone-Iodine Rash    Blistering rash   Pulmicort [Budesonide] Itching  DISCHARGE MEDICATIONS:   Allergies as of 12/24/2018      Reactions   Contrast Media [iodinated Diagnostic Agents] Shortness Of Breath   Per CT report in 2014 pt had break through contrast reaction with hives and SOB following 13 hour prep. MSY    Metrizamide Shortness Of Breath   Penicillins Hives, Other (See Comments)   Other reaction(s): Other (See  Comments) Has patient had a PCN reaction causing immediate rash, facial/tongue/throat swelling, SOB or lightheadedness with hypotension: Yes Has patient had a PCN reaction causing severe rash involving mucus membranes or skin necrosis: No Has patient had a PCN reaction that required hospitalization No Has patient had a PCN reaction occurring within the last 10 years: No If all of the above answers are "NO", then may proceed with Cephalosporin use. Has patient had a PCN reaction causing immediate rash, facial/tongue/throat swelling, SOB or lightheadedness with hypotension: Yes Has patient had a PCN reaction causing severe rash involving mucus membranes or skin necrosis: No Has patient had a PCN reaction that required hospitalization No Has patient had a PCN reaction occurring within the last 10 years: No If all of the above answers are "NO", then may proceed with Cephalosporin use.   Isosorbide Nitrate    Other reaction(s): Headache   Latex Hives   Lidocaine Hives   Ondansetron Other (See Comments)   Severe headache   Zofran [ondansetron Hcl] Other (See Comments)   Causes migraines   Hydromorphone Rash   Povidone-iodine Rash   Blistering rash   Pulmicort [budesonide] Itching      Medication List    TAKE these medications   albuterol 108 (90 Base) MCG/ACT inhaler Commonly known as:  VENTOLIN HFA Inhale 2 puffs into the lungs every 6 (six) hours as needed for shortness of breath.   albuterol 1.25 MG/3ML nebulizer solution Commonly known as:  ACCUNEB Take 3 mLs by nebulization every 6 (six) hours as needed for wheezing.   baclofen 10 MG tablet Commonly known as:  LIORESAL Take 10 mg by mouth 2 (two) times daily.   beclomethasone 80 MCG/ACT inhaler Commonly known as:  QVAR Inhale 2 puffs into the lungs 2 (two) times daily.   busPIRone 15 MG tablet Commonly known as:  BUSPAR Take 15 mg by mouth 2 (two) times daily.   fentaNYL 50 MCG/HR Commonly known as:  Lacey 1  patch onto the skin every 3 (three) days.   furosemide 40 MG tablet Commonly known as:  LASIX Take 40 mg by mouth 2 (two) times daily.   levothyroxine 150 MCG tablet Commonly known as:  SYNTHROID Take 150 mcg by mouth daily before breakfast.   losartan 25 MG tablet Commonly known as:  COZAAR Take 25 mg by mouth daily.   meloxicam 15 MG tablet Commonly known as:  MOBIC Take 15 mg by mouth daily.   metoprolol succinate 50 MG 24 hr tablet Commonly known as:  TOPROL-XL Take 100 mg by mouth daily.   nitroGLYCERIN 0.4 MG SL tablet Commonly known as:  NITROSTAT Place 1 tablet (0.4 mg total) under the tongue every 5 (five) minutes as needed for chest pain.   omeprazole 40 MG capsule Commonly known as:  PRILOSEC Take 40 mg by mouth 2 (two) times daily.   oxyCODONE-acetaminophen 5-325 MG tablet Commonly known as:  PERCOCET/ROXICET Take 1-2 tablets by mouth every 8 (eight) hours as needed for moderate pain.   potassium chloride SA 20 MEQ tablet Commonly known as:  Klor-Con M20 Take 1 tablet (  20 mEq total) by mouth 2 (two) times daily for 7 days. What changed:    how much to take  when to take this  reasons to take this  additional instructions   promethazine 25 MG tablet Commonly known as:  PHENERGAN Take 1 tablet (25 mg total) by mouth every 6 (six) hours as needed for nausea or vomiting.   rOPINIRole 2 MG tablet Commonly known as:  REQUIP Take 2 tablets (4 mg total) by mouth at bedtime. 2-4  Mg per night What changed:  additional instructions   spironolactone 50 MG tablet Commonly known as:  ALDACTONE Take 50 mg by mouth daily.   traMADol 50 MG tablet Commonly known as:  ULTRAM Take 50 mg by mouth every 6 (six) hours as needed.   venlafaxine XR 75 MG 24 hr capsule Commonly known as:  EFFEXOR-XR Take 75 mg by mouth daily.        DISCHARGE INSTRUCTIONS:    DIET:   Low fat, Low cholesterol diet  ACTIVITY:   Activity as tolerated  OXYGEN:   Home  Oxygen: No.  Oxygen Delivery: room air  DISCHARGE LOCATION:   home   If you experience worsening of your admission symptoms, develop shortness of breath, life threatening emergency, suicidal or homicidal thoughts you must seek medical attention immediately by calling 911 or calling your MD immediately  if symptoms less severe.  You Must read complete instructions/literature along with all the possible adverse reactions/side effects for all the Medicines you take and that have been prescribed to you. Take any new Medicines after you have completely understood and accpet all the possible adverse reactions/side effects.   Please note  You were cared for by a hospitalist during your hospital stay. If you have any questions about your discharge medications or the care you received while you were in the hospital after you are discharged, you can call the unit and asked to speak with the hospitalist on call if the hospitalist that took care of you is not available. Once you are discharged, your primary care physician will handle any further medical issues. Please note that NO REFILLS for any discharge medications will be authorized once you are discharged, as it is imperative that you return to your primary care physician (or establish a relationship with a primary care physician if you do not have one) for your aftercare needs so that they can reassess your need for medications and monitor your lab values.    On the day of Discharge:  VITAL SIGNS:   Blood pressure (!) 124/111, pulse (!) 111, temperature 98.5 F (36.9 C), temperature source Oral, resp. rate 17, height 5\' 5"  (1.651 m), weight 102 kg, last menstrual period 01/22/2012, SpO2 96 %.  PHYSICAL EXAMINATION:    GENERAL:  53 y.o.-year-old patient lying in the bed with no acute distress.  EYES: Pupils equal, round, reactive to light and accommodation. No scleral icterus. Extraocular muscles intact.  HEENT: Head atraumatic, normocephalic.  Oropharynx and nasopharynx clear.  NECK:  Supple, no jugular venous distention. No thyroid enlargement, no tenderness.  LUNGS: Normal breath sounds bilaterally, no wheezing, rales,rhonchi or crepitation. No use of accessory muscles of respiration.  CARDIOVASCULAR: S1, S2 normal. No murmurs, rubs, or gallops.  ABDOMEN: Soft, non-tender, non-distended. Bowel sounds present. No organomegaly or mass.  EXTREMITIES: No pedal edema, cyanosis, or clubbing.  NEUROLOGIC: Cranial nerves II through XII are intact. Muscle strength 5/5 in all extremities. Sensation intact. Gait not checked.  PSYCHIATRIC: The patient is  alert and oriented x 3.  SKIN: No obvious rash, lesion, or ulcer.   DATA REVIEW:   CBC Recent Labs  Lab 12/23/18 1559  WBC 7.7  HGB 13.3  HCT 37.2  PLT 252    Chemistries  Recent Labs  Lab 12/24/18 0439  NA 142  K 3.5  CL 107  CO2 28  GLUCOSE 108*  BUN 12  CREATININE 0.79  CALCIUM 8.3*     Microbiology Results  Results for orders placed or performed during the hospital encounter of 06/21/18  C difficile quick scan w PCR reflex     Status: None   Collection Time: 06/21/18 12:36 PM  Result Value Ref Range Status   C Diff antigen NEGATIVE NEGATIVE Final   C Diff toxin NEGATIVE NEGATIVE Final   C Diff interpretation No C. difficile detected.  Final    Comment: Performed at [facility], [address] [place], [place] [address]  Gastrointestinal Panel by PCR , Stool     Status: Abnormal   Collection Time: 06/21/18 12:36 PM  Result Value Ref Range Status   Campylobacter species NOT DETECTED NOT DETECTED Final   Plesimonas shigelloides NOT DETECTED NOT DETECTED Final   Salmonella species NOT DETECTED NOT DETECTED Final   Yersinia enterocolitica NOT DETECTED NOT DETECTED Final   Vibrio species NOT DETECTED NOT DETECTED Final   Vibrio cholerae NOT DETECTED NOT DETECTED Final   Enteroaggregative E coli (EAEC) NOT DETECTED NOT DETECTED Final    Enteropathogenic E coli (EPEC) NOT DETECTED NOT DETECTED Final   Enterotoxigenic E coli (ETEC) NOT DETECTED NOT DETECTED Final   Shiga like toxin producing E coli (STEC) NOT DETECTED NOT DETECTED Final   Shigella/Enteroinvasive E coli (EIEC) NOT DETECTED NOT DETECTED Final   Cryptosporidium NOT DETECTED NOT DETECTED Final   Cyclospora cayetanensis NOT DETECTED NOT DETECTED Final   Entamoeba histolytica NOT DETECTED NOT DETECTED Final   Giardia lamblia NOT DETECTED NOT DETECTED Final   Adenovirus F40/41 NOT DETECTED NOT DETECTED Final   Astrovirus NOT DETECTED NOT DETECTED Final   Norovirus GI/GII DETECTED (A) NOT DETECTED Final    Comment: RESULT CALLED TO, READ BACK BY AND VERIFIED WITH:  EMILY WILLIAMS AT 9323 06/21/18 SDR    Rotavirus A NOT DETECTED NOT DETECTED Final   Sapovirus (I, II, IV, and V) NOT DETECTED NOT DETECTED Final    Comment: Performed at [facility], [address] [place], [place] [address]    RADIOLOGY:  Dg Abdomen Acute W/chest  Result Date: 12/23/2018 CLINICAL DATA:  Abdominal pain, nausea, vomiting, diarrhea for 3 days EXAM: DG ABDOMEN ACUTE W/ 1V CHEST COMPARISON:  None. FINDINGS: There is no evidence of dilated bowel loops or free intraperitoneal air. No radiopaque calculi or other significant radiographic abnormality is seen. Heart size and mediastinal contours are within normal limits. Dual lead cardiac pacemaker. Both lungs are clear. IMPRESSION: Negative abdominal radiographs.  No acute cardiopulmonary disease. Electronically Signed   By: Kathreen Devoid   On: 12/23/2018 15:56     Management plans discussed with the patient, family and they are in agreement.  CODE STATUS:     Code Status Orders  (From admission, onward)         Start     Ordered   12/23/18 1913  Full code  Continuous     12/23/18 1912        Code Status History    Date Active Date Inactive Code Status Order ID Comments User Context  10/03/2018 2203 10/05/2018 1612  Full Code [id number]  Vaughan Basta, MD Inpatient   02/20/2018 2355 02/23/2018 1702 Full Code [id number]  Lance Coon, MD Inpatient   01/30/2018 0145 02/01/2018 1608 Full Code [id number]  Arta Silence, MD Inpatient   01/30/2018 0145 01/30/2018 0145 Full Code [id number]  Arta Silence, MD Inpatient   01/14/2018 1553 01/16/2018 1636 Full Code [id number]  Bettey Costa, MD Inpatient   01/08/2018 2351 01/11/2018 2010 Full Code [id number]  Amelia Jo, MD ED   10/30/2017 0113 11/01/2017 1421 Full Code [id number]  Lance Coon, MD ED   09/24/2017 0848 09/27/2017 1409 Full Code [id number]  Loletha Grayer, MD ED   07/03/2017 0654 07/05/2017 1248 Full Code [id number]  Saundra Shelling, MD ED   04/25/2017 0146 04/26/2017 1516 Full Code [id number]  Potrero, [place]er, DO ED   01/28/2017 1033 01/30/2017 1741 Full Code [id number]  [name], MD Inpatient   01/22/2017 0114 01/24/2017 1759 Full Code [id number]  Lance Coon, MD Inpatient   12/09/2016 0209 12/09/2016 1507 Full Code [id number]  Lance Coon, MD Inpatient   11/21/2015 0650 11/22/2015 1550 Full Code [id number]  Saundra Shelling, MD Inpatient   09/19/2015 0511 09/21/2015 1829 Full Code [id number]  Harrie Foreman, MD Inpatient      TOTAL TIME TAKING CARE OF THIS PATIENT: 38 minutes.    on 12/24/2018 at 12:41 PM   This patient was staffed with Dr. Stark Jock, Jude who personally evaluated patient, reviewed documentation and agreed withdischarge plan of care as above.  Rufina Falco, DNP, FNP-BC Hospitalist Nurse Practitioner  Between 7am to 6pm - Pager [phone]  After 6pm go to [email or web address] - Proofreader  [facility]  Office  [phone]  CC: Primary care physician; [name], MD   Note: This dictation was prepared with Dragon dictation along with smaller phrase technology. Any transcriptional errors that result from this process are unintentional.

## 2019-01-16 ENCOUNTER — Other Ambulatory Visit: Payer: Self-pay

## 2019-01-16 ENCOUNTER — Emergency Department
Admission: EM | Admit: 2019-01-16 | Discharge: 2019-01-16 | Disposition: A | Discharge status: Home / Self Care | Payer: Medicaid Other | Attending: Emergency Medicine | Admitting: Emergency Medicine

## 2019-01-16 ENCOUNTER — Encounter: Payer: Self-pay | Admitting: *Deleted

## 2019-01-16 ENCOUNTER — Emergency Department: Payer: Medicaid Other

## 2019-01-16 DIAGNOSIS — J449 Chronic obstructive pulmonary disease, unspecified: Secondary | ICD-10-CM | POA: Insufficient documentation

## 2019-01-16 DIAGNOSIS — Z1159 Encounter for screening for other viral diseases: Secondary | ICD-10-CM | POA: Diagnosis not present

## 2019-01-16 DIAGNOSIS — Z79899 Other long term (current) drug therapy: Secondary | ICD-10-CM | POA: Diagnosis not present

## 2019-01-16 DIAGNOSIS — Z91040 Latex allergy status: Secondary | ICD-10-CM | POA: Insufficient documentation

## 2019-01-16 DIAGNOSIS — R079 Chest pain, unspecified: Secondary | ICD-10-CM | POA: Diagnosis present

## 2019-01-16 LAB — CBC WITH DIFFERENTIAL/PLATELET
Abs Immature Granulocytes: 0.05 10*3/uL (ref 0.00–0.07)
Basophils Absolute: 0 10*3/uL (ref 0.0–0.1)
Basophils Relative: 0 %
Eosinophils Absolute: 0.2 10*3/uL (ref 0.0–0.5)
Eosinophils Relative: 2 %
HCT: 40.4 % (ref 36.0–46.0)
Hemoglobin: 14 g/dL (ref 12.0–15.0)
Immature Granulocytes: 1 %
Lymphocytes Relative: 33 %
Lymphs Abs: 3.3 10*3/uL (ref 0.7–4.0)
MCH: 30.1 pg (ref 26.0–34.0)
MCHC: 34.7 g/dL (ref 30.0–36.0)
MCV: 86.9 fL (ref 80.0–100.0)
Monocytes Absolute: 0.8 10*3/uL (ref 0.1–1.0)
Monocytes Relative: 8 %
Neutro Abs: 5.7 10*3/uL (ref 1.7–7.7)
Neutrophils Relative %: 56 %
Platelets: 290 10*3/uL (ref 150–400)
RBC: 4.65 MIL/uL (ref 3.87–5.11)
RDW: 13.2 % (ref 11.5–15.5)
WBC: 10 10*3/uL (ref 4.0–10.5)
nRBC: 0 % (ref 0.0–0.2)

## 2019-01-16 LAB — BASIC METABOLIC PANEL
Anion gap: 11 (ref 5–15)
BUN: 11 mg/dL (ref 6–20)
CO2: 29 mmol/L (ref 22–32)
Calcium: 8.8 mg/dL — ABNORMAL LOW (ref 8.9–10.3)
Chloride: 99 mmol/L (ref 98–111)
Creatinine, Ser: 0.9 mg/dL (ref 0.44–1.00)
GFR calc Af Amer: >60 mL/min (ref >60)
GFR calc non Af Amer: >60 mL/min (ref >60)
Glucose, Bld: 106 mg/dL — ABNORMAL HIGH (ref 70–99)
Potassium: 3.4 mmol/L — ABNORMAL LOW (ref 3.5–5.1)
Sodium: 139 mmol/L (ref 135–145)

## 2019-01-16 LAB — TROPONIN I
Troponin I: <0.03 ng/mL (ref <0.03)
Troponin I: <0.03 ng/mL (ref <0.03)

## 2019-01-16 LAB — BRAIN NATRIURETIC PEPTIDE: B Natriuretic Peptide: 40 pg/mL (ref 0.0–100.0)

## 2019-01-16 LAB — SARS CORONAVIRUS 2 BY RT PCR (HOSPITAL ORDER, PERFORMED IN ~~LOC~~ HOSPITAL LAB): SARS Coronavirus 2: NEGATIVE

## 2019-01-16 MED ORDER — FENTANYL CITRATE (PF) 100 MCG/2ML IJ SOLN
50.0000 ug | Freq: Once | INTRAMUSCULAR | Status: AC
  Administered 2019-01-16: 50 ug via INTRAVENOUS
  Filled 2019-01-16: qty 2

## 2019-01-16 MED ORDER — SODIUM CHLORIDE 0.9% FLUSH: 3.0000 mL | Freq: Once | INTRAVENOUS | Status: DC

## 2019-01-16 MED ORDER — METOCLOPRAMIDE HCL 5 MG/ML IJ SOLN
10.0000 mg | Freq: Once | INTRAMUSCULAR | Status: AC
  Administered 2019-01-16: 18:00:00 10 mg via INTRAVENOUS
  Filled 2019-01-16: qty 2

## 2019-01-16 NOTE — ED Triage Notes (Signed)
Pt presents w/ c/o chest pain. Pt has pacer/defibrillator. Pt c/o difficulty taking a deep breath. Pt c/o midsternal chest pain radiating to L chest and L shoulder blade. Pt states she has been nauseated. Pt also has PMH of "tumors her stomach". Pt looks pale and ill.

## 2019-01-16 NOTE — ED Notes (Signed)
Patient transported to X-ray 

## 2019-01-16 NOTE — ED Notes (Signed)
Handoff given to Rachel RN

## 2019-01-16 NOTE — ED Triage Notes (Signed)
Pt has fentanyl patch on LUE due to be changed tomorrow.

## 2019-01-16 NOTE — ED Notes (Signed)
Pt stated that she had pain starting at 11 this morning and took a nap until 2- when doing dishes she states she was "drenched in sweat" and called her cardiologist who told her she needed to go to [facility]- pt stated she wanted to stay local so she came here to be seen- pt has a pacemaker defibrillator placed in 2017- chest pain that radiates to the L arm and L shoulder blade- pt has a fentanyl patch on her L upper arm due to be changed tomorrow

## 2019-01-16 NOTE — Discharge Instructions (Addendum)
As explained to you, we have been unable to rule out a blood clot as cause of your symptoms since you declined the test required for such.  If you change your mind please return to the emergency room at any time to continue evaluation.  Otherwise it is very important they follow-up with your doctor in the morning for recheck.  Also return to the emergency room if your chest pain recurs, if you have new shortness of breath, if you feel dizzy, or if any other symptoms develop in the meantime.

## 2019-01-16 NOTE — ED Provider Notes (Signed)
[facility] Emergency Department Provider Note  ____________________________________________  Time seen: Approximately 5:50 PM  I have reviewed the triage vital signs and the nursing notes.   HISTORY  Chief Complaint Chest Pain   HPI Brittany Nguyen is a 53 y.o. female with a history of DVT not on anticoagulation, nonischemic cardiomyopathy with a EF of 45% status post pacemaker and defibrillator, carcinoid tumor of the stomach recently diagnosed, chronic abdominal pain, anemia who presents for evaluation of chest pain.  Patient reports 2 weeks of several daily episodes of nonbloody nonbilious emesis.  For about 4 to 5 days she has had a dry cough and progressively worsening shortness of breath and orthopnea.  She is using 2 pillows and propping her bed up even with her CPAP at nighttime.  She has been weighing herself every day with no changes in her weight.  She takes 80 mg of Lasix twice daily and endorses compliance with it.  She has had no fever or any known exposures to COVID 19.  Today she started having pain in her chest she describes as a constant substernal pressure associated with intermittent sharp pleuritic pain.  No hemoptysis, leg pain or swelling.  She is also complaining of upper abdominal pain which is chronic and unchanged from baseline.  She currently has a fentanyl patch that is supposed to be exchanged tomorrow that she uses for her chronic abdominal pain.  Past Medical History:  Diagnosis Date  . Asthma 2013  . 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 CHF (congestive heart failure) ([place])   . COPD (chronic obstructive pulmonary disease) ([place])   . Diverticulitis 2010  . DVT (deep venous thrombosis) ([place])   . Endometriosis 1990  . Heart disease 2013  . Hx MRSA infection   . Iron deficiency   . Lump or mass in breast   . Restless leg   . Stomach cancer [facility])     Patient Active Problem List   Diagnosis Date Noted  . Iron  deficiency anemia 03/27/2018  . Anemia 03/26/2018  . Abdominal pain 02/20/2018  . Unstable angina ([place])   . Encounter for anticoagulation discussion and counseling   . C. difficile colitis 01/14/2018  . GIB (gastrointestinal bleeding) 01/08/2018  . SOB (shortness of breath) 11/01/2017  . Influenza A 10/29/2017  . Acute respiratory failure ([place]) 09/24/2017  . Sepsis ([place]) 07/03/2017  . COPD (chronic obstructive pulmonary disease) ([place]) 04/26/2017  . Community acquired pneumonia 04/25/2017  . Chronic systolic heart failure ([place]) 02/03/2017  . Hypotension 02/03/2017  . Chest pain 12/09/2016  . RLS (restless legs syndrome) 12/09/2016  . Cough, persistent 11/22/2015  . Hypokalemia 11/22/2015  . Leukocytosis 11/22/2015  . Dyspnea 11/21/2015  . Respiratory distress 09/19/2015  . Breast pain 01/11/2013  . Hx MRSA infection   . Lump or mass in breast   . GOITER, MULTINODULAR 01/20/2009  . HYPOGLYCEMIA, UNSPECIFIED 01/20/2009  . GERD 01/20/2009  . DIVERTICULITIS OF COLON 01/20/2009    Past Surgical History:  Procedure Laterality Date  . ABDOMINAL HYSTERECTOMY     age 66  . BREAST BIOPSY Right 2014   benign  . CARDIAC DEFIBRILLATOR PLACEMENT    . CARDIAC DEFIBRILLATOR PLACEMENT    . CESAREAN SECTION    . CHOLECYSTECTOMY    . COLECTOMY    . COLONOSCOPY WITH PROPOFOL N/A 02/22/2018   Procedure: COLONOSCOPY WITH PROPOFOL;  Surgeon: Toledo, Benay Pike, MD;  Location: [facility] ENDOSCOPY;  Service: Gastroenterology;  Laterality:  N/A;  . ESOPHAGOGASTRODUODENOSCOPY (EGD) WITH PROPOFOL N/A 02/22/2018   Procedure: ESOPHAGOGASTRODUODENOSCOPY (EGD) WITH PROPOFOL;  Surgeon: Toledo, Benay Pike, MD;  Location: [facility] ENDOSCOPY;  Service: Gastroenterology;  Laterality: N/A;  . NASAL SINUS SURGERY  2012  . OOPHORECTOMY      Prior to Admission medications   Medication Sig Start Date End Date Taking? Authorizing Provider  albuterol (ACCUNEB) 1.25 MG/3ML nebulizer solution Take 3 mLs by nebulization every  6 (six) hours as needed for wheezing. 11/09/17   [provider]  albuterol (VENTOLIN HFA) 108 (90 Base) MCG/ACT inhaler Inhale 2 puffs into the lungs every 6 (six) hours as needed for shortness of breath. 08/28/16   [provider]  baclofen (LIORESAL) 10 MG tablet Take 10 mg by mouth 2 (two) times daily. 06/11/18   [provider]  beclomethasone (QVAR) 80 MCG/ACT inhaler Inhale 2 puffs into the lungs 2 (two) times daily. 06/05/17 10/03/18  Laverle Hobby, MD  busPIRone (BUSPAR) 15 MG tablet Take 15 mg by mouth 2 (two) times daily. 05/08/18   [provider]  fentaNYL (DURAGESIC) 50 MCG/HR Place 1 patch onto the skin every 3 (three) days. 10/08/18   Epifanio Lesches, MD  furosemide (LASIX) 40 MG tablet Take 40 mg by mouth 2 (two) times daily. 07/09/18   [provider]  levothyroxine (SYNTHROID, LEVOTHROID) 150 MCG tablet Take 150 mcg by mouth daily before breakfast.    [provider]  losartan (COZAAR) 25 MG tablet Take 25 mg by mouth daily.     [provider]  meloxicam (MOBIC) 15 MG tablet Take 15 mg by mouth daily. 08/30/18   [provider]  metoprolol succinate (TOPROL-XL) 50 MG 24 hr tablet Take 100 mg by mouth daily. 10/08/18 10/08/19  [provider]  nitroGLYCERIN (NITROSTAT) 0.4 MG SL tablet Place 1 tablet (0.4 mg total) under the tongue every 5 (five) minutes as needed for chest pain. 01/24/17   Henreitta Leber, MD  omeprazole (PRILOSEC) 40 MG capsule Take 40 mg by mouth 2 (two) times daily. 10/19/18   [provider]  oxyCODONE-acetaminophen (PERCOCET/ROXICET) 5-325 MG tablet Take 1-2 tablets by mouth every 8 (eight) hours as needed for moderate pain. 10/05/18   Epifanio Lesches, MD  potassium chloride SA (KLOR-CON M20) 20 MEQ tablet Take 1 tablet (20 mEq total) by mouth 2 (two) times daily for 7 days. Patient taking differently: Take 40 mEq by mouth daily as needed. 1 tablet twice a day 06/17/18  10/03/18  Hinda Kehr, MD  promethazine (PHENERGAN) 25 MG tablet Take 1 tablet (25 mg total) by mouth every 6 (six) hours as needed for nausea or vomiting. 10/05/18   Epifanio Lesches, MD  rOPINIRole (REQUIP) 2 MG tablet Take 2 tablets (4 mg total) by mouth at bedtime. 2-4  Mg per night Patient taking differently: Take 4 mg by mouth at bedtime.  01/16/18   Loletha Grayer, MD  spironolactone (ALDACTONE) 50 MG tablet Take 50 mg by mouth daily. 11/26/18   [provider]  traMADol (ULTRAM) 50 MG tablet Take 50 mg by mouth every 6 (six) hours as needed.    [provider]  venlafaxine XR (EFFEXOR-XR) 75 MG 24 hr capsule Take 75 mg by mouth daily. 11/14/18 11/14/19  [provider]    Allergies Contrast media [iodinated diagnostic agents]; Metrizamide; Penicillins; Isosorbide nitrate; Latex; Lidocaine; Ondansetron; Zofran [ondansetron hcl]; Hydromorphone; Povidone-iodine; and Pulmicort [budesonide]  Family History  Problem Relation Age of Onset  . Cancer Mother 63  ovarian  . CAD Mother   . Cancer Father 66       brain  . CAD Father   . Cancer Daughter 18       skin  . Cancer Maternal Aunt 56       breast  . Leukemia Paternal Grandfather     Social History Social History   Tobacco Use  . Smoking status: Never Smoker  . Smokeless tobacco: Never Used  Substance Use Topics  . Alcohol use: No  . Drug use: No    Review of Systems  Constitutional: Negative for fever. Eyes: Negative for visual changes. ENT: Negative for sore throat. Neck: No neck pain  Cardiovascular: + chest pain. Respiratory: + shortness of breath, cough Gastrointestinal: + chronic abdominal pain and vomiting. No diarrhea. Genitourinary: Negative for dysuria. Musculoskeletal: Negative for back pain. Skin: Negative for rash. Neurological: Negative for headaches, weakness or numbness. Psych: No SI or HI  ____________________________________________   PHYSICAL EXAM:   VITAL SIGNS: ED Triage Vitals  Enc Vitals Group     BP 01/16/19 1650 102/70     Pulse Rate 01/16/19 1650 98     Resp 01/16/19 1650 20     Temp 01/16/19 1650 98.1 F (36.7 C)     Temp Source 01/16/19 1650 Oral     SpO2 01/16/19 1650 98 %     Weight 01/16/19 1650 200 lb (90.7 kg)     Height 01/16/19 1650 5\' 5"  (1.651 m)     Head Circumference --      Peak Flow --      Pain Score 01/16/19 1658 8     Pain Loc --      Pain Edu? --      Excl. in [place]? --     Constitutional: Alert and oriented, no distress.  HEENT:      Head: Normocephalic and atraumatic.         Eyes: Conjunctivae are normal. Sclera is non-icteric.       Mouth/Throat: Mucous membranes are moist.       Neck: Supple with no signs of meningismus. Cardiovascular: Regular rate and rhythm. No murmurs, gallops, or rubs. 2+ symmetrical distal pulses are present in all extremities. No JVD. Respiratory: Normal respiratory effort. Lungs are clear to auscultation bilaterally. No wheezes, crackles, or rhonchi.  Gastrointestinal: Soft, mild diffuse tenderness on the upper quadrants, and non distended with positive bowel sounds. No rebound or guarding. Musculoskeletal: Nontender with normal range of motion in all extremities. No edema, cyanosis, or erythema of extremities. Neurologic: Normal speech and language. Face is symmetric. Moving all extremities. No gross focal neurologic deficits are appreciated. Skin: Skin is warm, dry and intact. No rash noted. Psychiatric: Mood and affect are normal. Speech and behavior are normal.  ____________________________________________   LABS (all labs ordered are listed, but only abnormal results are displayed)  Labs Reviewed  BASIC METABOLIC PANEL - Abnormal; Notable for the following components:      Result Value   Potassium 3.4 (*)    Glucose, Bld 106 (*)    Calcium 8.8 (*)    All other components within normal limits  SARS CORONAVIRUS 2 (HOSPITAL ORDER, [place] [facility])  TROPONIN I  CBC WITH DIFFERENTIAL/PLATELET  BRAIN NATRIURETIC PEPTIDE  TROPONIN I   ____________________________________________  EKG  ED ECG REPORT I, Rudene Re, the attending physician, personally viewed and interpreted this ECG.  Normal sinus rhythm, rate of 99, normal intervals, left axis deviation,  anterior and inferior Q waves, no ST elevations or depressions.  EKG is unchanged from prior. ____________________________________________  RADIOLOGY  I have personally reviewed the images performed during this visit and I agree with the Radiologist's read.   Interpretation by Radiologist:  Dg Chest 2 View  Result Date: 01/16/2019 CLINICAL DATA:  Nausea and chest pain today. EXAM: CHEST - 2 VIEW COMPARISON:  Single-view of the chest 12/23/2018 and 09/24/2017. FINDINGS: Lungs are clear. Heart size is normal. Pacing device is unchanged. No pneumothorax or pleural fluid. No acute or focal bony abnormality. IMPRESSION: No acute disease. Electronically Signed   By: Inge Rise M.D.   On: 01/16/2019 17:36     ____________________________________________   PROCEDURES  Procedure(s) performed: None Procedures Critical Care performed:  None ____________________________________________   INITIAL IMPRESSION / ASSESSMENT AND PLAN / ED COURSE   53 y.o. female with a history of DVT not on anticoagulation, nonischemic cardiomyopathy with a EF of 45% status post pacemaker and defibrillator, carcinoid tumor of the stomach recently diagnosed, chronic abdominal pain, anemia who presents for evaluation of chest pain, SOB, orthopnea, cough, vomiting.  Patient is in no distress with normal work of breathing, normal sats, clear lungs with good air movement bilaterally.  EKG showing no acute ischemic changes or dysrhythmias.  Patient looks euvolemic with no pitting edema normal JVD, no crackles.  Chest x-ray negative for pulmonary edema or pneumonia.  Ddx anginal pain,  ACS, CHF, PNA, COVID, viral infection, PE.  Will monitor closely on telemetry.  Will get labs, COVID swab.  Patient has significant allergies to IV contrast therefore we will send patient for a VQ scan.  Clinical Course as of Jan 15 2054  Wed Jan 16, 2019  2053 After the first dose of fentanyl patient has remained with no further episodes of chest pain.  COVID-19 negative.  Chest x-ray no evidence of pneumonia or pulmonary edema.  BNP is normal.  Troponin x2 is normal.  VQ scan initially ordered since patient has allergic reaction to IV contrast.  At this time patient is requesting discharge and does not wish to wait for the VQ scan.  She reports that she feels markedly improved and in the past with her blood clot she usually was unable to be asymptomatic even with narcotic pain medication.  I explained to her the dangers of not pursuing the VQ scan including missing a large PE which can result in significant arrhythmias, respiratory arrest, cardiac arrest.  Patient understands these risks but continues to wish to go home.  She will follow-up with her PCP tomorrow or Friday.  I discussed very strict return precautions including recurrence of her chest pain or shortness of breath.  At this time will discharge home with close follow-up.   [CV]    Clinical Course User Index [CV] Alfred Levins [place], MD     As part of my medical decision making, I reviewed the following data within the [place] notes reviewed and incorporated, Labs reviewed , EKG interpreted , Old EKG reviewed, Old chart reviewed, Radiograph reviewed , Notes from prior ED visits and [place] Controlled Substance Database    Pertinent labs & imaging results that were available during my care of the patient were reviewed by me and considered in my medical decision making (see chart for details).    ____________________________________________   FINAL CLINICAL IMPRESSION(S) / ED DIAGNOSES  Final diagnoses:   Chest pain, unspecified type      NEW MEDICATIONS STARTED DURING THIS  VISIT:  ED Discharge Orders    None       Note:  This document was prepared using Dragon voice recognition software and may include unintentional dictation errors.    Alfred Levins, [place], MD 01/16/19 2056

## 2019-02-27 ENCOUNTER — Encounter: Payer: Self-pay | Admitting: Emergency Medicine

## 2019-02-27 ENCOUNTER — Other Ambulatory Visit: Payer: Self-pay

## 2019-02-27 ENCOUNTER — Emergency Department
Admission: EM | Admit: 2019-02-27 | Discharge: 2019-02-27 | Disposition: A | Discharge status: Home / Self Care | Payer: Medicaid Other | Attending: Emergency Medicine | Admitting: Emergency Medicine

## 2019-02-27 DIAGNOSIS — I82409 Acute embolism and thrombosis of unspecified deep veins of unspecified lower extremity: Secondary | ICD-10-CM | POA: Insufficient documentation

## 2019-02-27 DIAGNOSIS — C7A092 Malignant carcinoid tumor of the stomach: Secondary | ICD-10-CM | POA: Insufficient documentation

## 2019-02-27 DIAGNOSIS — L299 Pruritus, unspecified: Secondary | ICD-10-CM | POA: Insufficient documentation

## 2019-02-27 DIAGNOSIS — M2550 Pain in unspecified joint: Secondary | ICD-10-CM | POA: Diagnosis present

## 2019-02-27 DIAGNOSIS — Z91040 Latex allergy status: Secondary | ICD-10-CM | POA: Diagnosis not present

## 2019-02-27 DIAGNOSIS — Z79899 Other long term (current) drug therapy: Secondary | ICD-10-CM | POA: Insufficient documentation

## 2019-02-27 DIAGNOSIS — I5022 Chronic systolic (congestive) heart failure: Secondary | ICD-10-CM | POA: Insufficient documentation

## 2019-02-27 DIAGNOSIS — Z880 Allergy status to penicillin: Secondary | ICD-10-CM | POA: Diagnosis not present

## 2019-02-27 DIAGNOSIS — J45909 Unspecified asthma, uncomplicated: Secondary | ICD-10-CM | POA: Diagnosis not present

## 2019-02-27 DIAGNOSIS — W57XXXA Bitten or stung by nonvenomous insect and other nonvenomous arthropods, initial encounter: Secondary | ICD-10-CM | POA: Insufficient documentation

## 2019-02-27 DIAGNOSIS — J449 Chronic obstructive pulmonary disease, unspecified: Secondary | ICD-10-CM | POA: Diagnosis not present

## 2019-02-27 DIAGNOSIS — R51 Headache: Secondary | ICD-10-CM | POA: Insufficient documentation

## 2019-02-27 MED ORDER — DOXYCYCLINE HYCLATE 100 MG PO CAPS: 100.0000 mg | ORAL_CAPSULE | Freq: Two times a day (BID) | ORAL | 0 refills | Status: DC

## 2019-02-27 MED ORDER — OXYCODONE-ACETAMINOPHEN 5-325 MG PO TABS: 1.0000 | ORAL_TABLET | Freq: Four times a day (QID) | ORAL | 0 refills | Status: DC | PRN

## 2019-02-27 MED ORDER — PROMETHAZINE HCL 25 MG PO TABS: 25.0000 mg | ORAL_TABLET | Freq: Four times a day (QID) | ORAL | 0 refills | Status: DC | PRN

## 2019-02-27 NOTE — Discharge Instructions (Addendum)
Follow-up with your primary care provider if any continued problems.  Begin taking doxycycline today twice daily for the next 10 days.  Also a prescription for Percocet was sent to your pharmacy and take as needed for muscle and body aches.  Be aware that this medication could cause drowsiness and increase your risk for falling.  Make your surgeon aware that you are taking these medications prior to your surgery.

## 2019-02-27 NOTE — ED Provider Notes (Signed)
[facility] Emergency Department Provider Note   ____________________________________________   First MD Initiated Contact with Patient 02/27/19 [phone]     (approximate)  I have reviewed the triage vital signs and the nursing notes.   HISTORY  Chief Complaint Generalized Body Aches   HPI Brittany Nguyen is a 53 y.o. female presents to the ED with complaint of generalized body aches especially in her joints for the last 3 days.  Patient states that she was camping approximately 2 weeks ago and had 2 tick bites.  She denies any fever, chills, nausea or vomiting.  Patient does report headache, and itching sensation, and muscle and joint aches.  One else in the family that was camping has any of the same symptoms.  Patient states that she is currently diagnosed with a carcinoid tumor that was determined by a biopsy of her stomach and is having surgery in 2 weeks.  She rates her pain as a 9/10.       Past Medical History:  Diagnosis Date  . Asthma 2013  . 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 CHF (congestive heart failure) ([place])   . COPD (chronic obstructive pulmonary disease) ([place])   . Diverticulitis 2010  . DVT (deep venous thrombosis) ([place])   . Endometriosis 1990  . Heart disease 2013  . Hx MRSA infection   . Iron deficiency   . Lump or mass in breast   . Restless leg   . Stomach cancer [facility])     Patient Active Problem List   Diagnosis Date Noted  . Iron deficiency anemia 03/27/2018  . Anemia 03/26/2018  . Abdominal pain 02/20/2018  . Unstable angina ([place])   . Encounter for anticoagulation discussion and counseling   . C. difficile colitis 01/14/2018  . GIB (gastrointestinal bleeding) 01/08/2018  . SOB (shortness of breath) 11/01/2017  . Influenza A 10/29/2017  . Acute respiratory failure ([place]) 09/24/2017  . Sepsis ([place]) 07/03/2017  . COPD (chronic obstructive pulmonary disease) ([place]) 04/26/2017  . Community acquired  pneumonia 04/25/2017  . Chronic systolic heart failure ([place]) 02/03/2017  . Hypotension 02/03/2017  . Chest pain 12/09/2016  . RLS (restless legs syndrome) 12/09/2016  . Cough, persistent 11/22/2015  . Hypokalemia 11/22/2015  . Leukocytosis 11/22/2015  . Dyspnea 11/21/2015  . Respiratory distress 09/19/2015  . Breast pain 01/11/2013  . Hx MRSA infection   . Lump or mass in breast   . GOITER, MULTINODULAR 01/20/2009  . HYPOGLYCEMIA, UNSPECIFIED 01/20/2009  . GERD 01/20/2009  . DIVERTICULITIS OF COLON 01/20/2009    Past Surgical History:  Procedure Laterality Date  . ABDOMINAL HYSTERECTOMY     age 26  . BREAST BIOPSY Right 2014   benign  . CARDIAC DEFIBRILLATOR PLACEMENT    . CARDIAC DEFIBRILLATOR PLACEMENT    . CESAREAN SECTION    . CHOLECYSTECTOMY    . COLECTOMY    . COLONOSCOPY WITH PROPOFOL N/A 02/22/2018   Procedure: COLONOSCOPY WITH PROPOFOL;  Surgeon: Toledo, Benay Pike, MD;  Location: [facility] ENDOSCOPY;  Service: Gastroenterology;  Laterality: N/A;  . ESOPHAGOGASTRODUODENOSCOPY (EGD) WITH PROPOFOL N/A 02/22/2018   Procedure: ESOPHAGOGASTRODUODENOSCOPY (EGD) WITH PROPOFOL;  Surgeon: Toledo, Benay Pike, MD;  Location: [facility] ENDOSCOPY;  Service: Gastroenterology;  Laterality: N/A;  . NASAL SINUS SURGERY  2012  . OOPHORECTOMY      Prior to Admission medications   Medication Sig Start Date End Date Taking? Authorizing Provider  albuterol (ACCUNEB) 1.25 MG/3ML nebulizer solution Take 3 mLs by  nebulization every 6 (six) hours as needed for wheezing. 11/09/17   [provider]  albuterol (VENTOLIN HFA) 108 (90 Base) MCG/ACT inhaler Inhale 2 puffs into the lungs every 6 (six) hours as needed for shortness of breath. 08/28/16   [provider]  baclofen (LIORESAL) 10 MG tablet Take 10 mg by mouth 2 (two) times daily. 06/11/18   [provider]  beclomethasone (QVAR) 80 MCG/ACT inhaler Inhale 2 puffs into the lungs 2 (two) times daily. 06/05/17 10/03/18   Laverle Hobby, MD  busPIRone (BUSPAR) 15 MG tablet Take 15 mg by mouth 2 (two) times daily. 05/08/18   [provider]  doxycycline (VIBRAMYCIN) 100 MG capsule Take 1 capsule (100 mg total) by mouth 2 (two) times daily. 02/27/19   Johnn Hai, PA-C  fentaNYL (DURAGESIC) 50 MCG/HR Place 1 patch onto the skin every 3 (three) days. 10/08/18   Epifanio Lesches, MD  furosemide (LASIX) 40 MG tablet Take 40 mg by mouth 2 (two) times daily. 07/09/18   [provider]  levothyroxine (SYNTHROID, LEVOTHROID) 150 MCG tablet Take 150 mcg by mouth daily before breakfast.    [provider]  losartan (COZAAR) 25 MG tablet Take 25 mg by mouth daily.     [provider]  meloxicam (MOBIC) 15 MG tablet Take 15 mg by mouth daily. 08/30/18   [provider]  metoprolol succinate (TOPROL-XL) 50 MG 24 hr tablet Take 100 mg by mouth daily. 10/08/18 10/08/19  [provider]  nitroGLYCERIN (NITROSTAT) 0.4 MG SL tablet Place 1 tablet (0.4 mg total) under the tongue every 5 (five) minutes as needed for chest pain. 01/24/17   Henreitta Leber, MD  omeprazole (PRILOSEC) 40 MG capsule Take 40 mg by mouth 2 (two) times daily. 10/19/18   [provider]  oxyCODONE-acetaminophen (PERCOCET) 5-325 MG tablet Take 1 tablet by mouth every 6 (six) hours as needed for severe pain. 02/27/19   Johnn Hai, PA-C  potassium chloride SA (KLOR-CON M20) 20 MEQ tablet Take 1 tablet (20 mEq total) by mouth 2 (two) times daily for 7 days. Patient taking differently: Take 40 mEq by mouth daily as needed. 1 tablet twice a day 06/17/18 10/03/18  Hinda Kehr, MD  promethazine (PHENERGAN) 25 MG tablet Take 1 tablet (25 mg total) by mouth every 6 (six) hours as needed for nausea or vomiting. 02/27/19   Johnn Hai, PA-C  rOPINIRole (REQUIP) 2 MG tablet Take 2 tablets (4 mg total) by mouth at bedtime. 2-4  Mg per night Patient taking differently: Take 4 mg by mouth at bedtime.   01/16/18   Loletha Grayer, MD  spironolactone (ALDACTONE) 50 MG tablet Take 50 mg by mouth daily. 11/26/18   [provider]  venlafaxine XR (EFFEXOR-XR) 75 MG 24 hr capsule Take 75 mg by mouth daily. 11/14/18 11/14/19  [provider]    Allergies Contrast media [iodinated diagnostic agents], Metrizamide, Penicillins, Isosorbide nitrate, Latex, Lidocaine, Ondansetron, Zofran [ondansetron hcl], Hydromorphone, Povidone-iodine, and Pulmicort [budesonide]  Family History  Problem Relation Age of Onset  . Cancer Mother 30       ovarian  . CAD Mother   . Cancer Father 32       brain  . CAD Father   . Cancer Daughter 18       skin  . Cancer Maternal Aunt 68       breast  . Leukemia Paternal Grandfather     Social History Social History   Tobacco  Use  . Smoking status: Never Smoker  . Smokeless tobacco: Never Used  Substance Use Topics  . Alcohol use: No  . Drug use: No    Review of Systems Constitutional: No fever/chills Cardiovascular: Denies chest pain. Respiratory: Denies shortness of breath. Gastrointestinal:   No nausea, no vomiting.  No diarrhea.  No constipation. Musculoskeletal: Generalized muscle and joint aches. Skin: Negative for rash.  Positive for itching. Neurological: Positive for headaches, negative focal weakness or numbness. ___________________________________________   PHYSICAL EXAM:  VITAL SIGNS: ED Triage Vitals  Enc Vitals Group     BP 02/27/19 0709 113/61     Pulse Rate 02/27/19 0709 98     Resp 02/27/19 0709 18     Temp 02/27/19 0709 98.6 F (37 C)     Temp Source 02/27/19 0709 Oral     SpO2 02/27/19 0709 98 %     Weight 02/27/19 0710 210 lb (95.3 kg)     Height 02/27/19 0710 5\' 5"  (1.651 m)     Head Circumference --      Peak Flow --      Pain Score 02/27/19 0710 9     Pain Loc --      Pain Edu? --      Excl. in [place]? --    Constitutional: Alert and oriented. Well appearing and in no acute distress. Eyes:  Conjunctivae are normal. PERRL. EOMI. Head: Atraumatic. Neck: No stridor.   Cardiovascular: Normal rate, regular rhythm. Grossly normal heart sounds.  Good peripheral circulation. Respiratory: Normal respiratory effort.  No retractions. Lungs CTAB. Musculoskeletal: Patient is able move upper and lower extremities but reports pain with range of motion.  No edema is noted of the joints especially of note was the fingers, wrists, elbows, knees and ankles. Neurologic:  Normal speech and language. No gross focal neurologic deficits are appreciated. No gait instability. Skin:  Skin is warm, dry and intact. No rash noted. Psychiatric: Mood and affect are normal. Speech and behavior are normal.  ____________________________________________   LABS (all labs ordered are listed, but only abnormal results are displayed)  Labs Reviewed - No data to display  PROCEDURES  Procedure(s) performed (including Critical Care):  Procedures   ____________________________________________   INITIAL IMPRESSION / ASSESSMENT AND PLAN / ED COURSE  As part of my medical decision making, I reviewed the following data within the electronic MEDICAL RECORD NUMBER Notes from prior ED visits and [place] Controlled Substance Database  53 year old female presents to the ED with multiple arthralgias and myalgias after having to ticks bite her approximately 2 weeks ago.  Patient states there is been no rash but her skin is itching.  She also developed the aches.  She denies any fever or chills.  She has had headache along with her other symptoms.  No one else in her family have the same symptoms.  Patient was placed on doxycycline 100 mg twice daily for 10 days, Percocet 5 mg and phenergan if needed for nausea.  Patient has an appointment with her doctor and was made aware that she needs to let them know that she is on this medication although her surgery is planned for 2 weeks from now.  ____________________________________________    FINAL CLINICAL IMPRESSION(S) / ED DIAGNOSES  Final diagnoses:  Tick bite, initial encounter  Arthralgia of multiple sites     ED Discharge Orders         Ordered    doxycycline (VIBRAMYCIN) 100 MG capsule  2 times daily  02/27/19 0846    oxyCODONE-acetaminophen (PERCOCET) 5-325 MG tablet  Every 6 hours PRN     02/27/19 0846    promethazine (PHENERGAN) 25 MG tablet  Every 6 hours PRN     02/27/19 0930           Note:  This document was prepared using Dragon voice recognition software and may include unintentional dictation errors.    Johnn Hai, PA-C 02/27/19 3704    Schuyler Amor, MD 02/27/19 [phone]

## 2019-02-27 NOTE — ED Triage Notes (Signed)
Patient reports body aches, especially in joints since Monday. States she found a tick on her two weeks ago but no redness to area of bite. Patient also complaining of generalized itching but no rash.

## 2019-03-15 ENCOUNTER — Emergency Department
Admission: EM | Admit: 2019-03-15 | Discharge: 2019-03-16 | Disposition: A | Discharge status: Home / Self Care | Payer: Medicaid Other | Attending: Emergency Medicine | Admitting: Emergency Medicine

## 2019-03-15 ENCOUNTER — Encounter: Payer: Self-pay | Admitting: Emergency Medicine

## 2019-03-15 ENCOUNTER — Emergency Department: Payer: Medicaid Other

## 2019-03-15 ENCOUNTER — Other Ambulatory Visit: Payer: Self-pay

## 2019-03-15 DIAGNOSIS — Z79899 Other long term (current) drug therapy: Secondary | ICD-10-CM | POA: Insufficient documentation

## 2019-03-15 DIAGNOSIS — I110 Hypertensive heart disease with heart failure: Secondary | ICD-10-CM | POA: Insufficient documentation

## 2019-03-15 DIAGNOSIS — Z91040 Latex allergy status: Secondary | ICD-10-CM | POA: Insufficient documentation

## 2019-03-15 DIAGNOSIS — J449 Chronic obstructive pulmonary disease, unspecified: Secondary | ICD-10-CM | POA: Diagnosis not present

## 2019-03-15 DIAGNOSIS — K2970 Gastritis, unspecified, without bleeding: Secondary | ICD-10-CM

## 2019-03-15 DIAGNOSIS — R1010 Upper abdominal pain, unspecified: Secondary | ICD-10-CM | POA: Diagnosis present

## 2019-03-15 DIAGNOSIS — I5022 Chronic systolic (congestive) heart failure: Secondary | ICD-10-CM | POA: Diagnosis not present

## 2019-03-15 LAB — COMPREHENSIVE METABOLIC PANEL
ALT: 22 U/L (ref 0–44)
AST: 24 U/L (ref 15–41)
Albumin: 4.4 g/dL (ref 3.5–5.0)
Alkaline Phosphatase: 87 U/L (ref 38–126)
Anion gap: 12 (ref 5–15)
BUN: 12 mg/dL (ref 6–20)
CO2: 29 mmol/L (ref 22–32)
Calcium: 8.8 mg/dL — ABNORMAL LOW (ref 8.9–10.3)
Chloride: 98 mmol/L (ref 98–111)
Creatinine, Ser: 1.04 mg/dL — ABNORMAL HIGH (ref 0.44–1.00)
GFR calc Af Amer: >60 mL/min (ref >60)
GFR calc non Af Amer: >60 mL/min (ref >60)
Glucose, Bld: 104 mg/dL — ABNORMAL HIGH (ref 70–99)
Potassium: 3.3 mmol/L — ABNORMAL LOW (ref 3.5–5.1)
Sodium: 139 mmol/L (ref 135–145)
Total Bilirubin: 0.5 mg/dL (ref 0.3–1.2)
Total Protein: 8.1 g/dL (ref 6.5–8.1)

## 2019-03-15 LAB — URINALYSIS, COMPLETE (UACMP) WITH MICROSCOPIC
Bilirubin Urine: NEGATIVE
Glucose, UA: NEGATIVE mg/dL
Hgb urine dipstick: NEGATIVE
Ketones, ur: NEGATIVE mg/dL
Nitrite: NEGATIVE
Protein, ur: NEGATIVE mg/dL
Specific Gravity, Urine: 1.023 (ref 1.005–1.030)
pH: 5 (ref 5.0–8.0)

## 2019-03-15 LAB — CBC
HCT: 41.9 % (ref 36.0–46.0)
Hemoglobin: 14.7 g/dL (ref 12.0–15.0)
MCH: 30.5 pg (ref 26.0–34.0)
MCHC: 35.1 g/dL (ref 30.0–36.0)
MCV: 86.9 fL (ref 80.0–100.0)
Platelets: 300 10*3/uL (ref 150–400)
RBC: 4.82 MIL/uL (ref 3.87–5.11)
RDW: 13.3 % (ref 11.5–15.5)
WBC: 9.8 10*3/uL (ref 4.0–10.5)
nRBC: 0 % (ref 0.0–0.2)

## 2019-03-15 LAB — LIPASE, BLOOD: Lipase: 32 U/L (ref 11–51)

## 2019-03-15 MED ORDER — FENTANYL CITRATE (PF) 100 MCG/2ML IJ SOLN
25.0000 ug | Freq: Once | INTRAMUSCULAR | Status: AC
  Administered 2019-03-15: 18:00:00 25 ug via INTRAVENOUS
  Filled 2019-03-15: qty 2

## 2019-03-15 MED ORDER — HYDROMORPHONE HCL 1 MG/ML IJ SOLN
1.0000 mg | Freq: Once | INTRAMUSCULAR | Status: AC
  Administered 2019-03-15: 22:00:00 1 mg via INTRAVENOUS
  Filled 2019-03-15: qty 1

## 2019-03-15 MED ORDER — SUCRALFATE 1 G PO TABS
1.0000 g | ORAL_TABLET | Freq: Once | ORAL | Status: AC
  Administered 2019-03-15: 23:00:00 1 g via ORAL
  Filled 2019-03-15: qty 1

## 2019-03-15 MED ORDER — SODIUM CHLORIDE 0.9 % IV BOLUS
1000.0000 mL | Freq: Once | INTRAVENOUS | Status: AC
  Administered 2019-03-15: 22:00:00 1000 mL via INTRAVENOUS

## 2019-03-15 MED ORDER — PANTOPRAZOLE SODIUM 40 MG IV SOLR
40.0000 mg | Freq: Once | INTRAVENOUS | Status: AC
  Administered 2019-03-15: 23:00:00 40 mg via INTRAVENOUS
  Filled 2019-03-15: qty 40

## 2019-03-15 MED ORDER — SUCRALFATE 1 G PO TABS: 1.0000 g | ORAL_TABLET | Freq: Four times a day (QID) | ORAL | 1 refills | Status: DC

## 2019-03-15 MED ORDER — FAMOTIDINE 20 MG PO TABS: 20.0000 mg | ORAL_TABLET | Freq: Two times a day (BID) | ORAL | 0 refills | Status: DC

## 2019-03-15 MED ORDER — PROMETHAZINE HCL 25 MG RE SUPP: 25.0000 mg | Freq: Four times a day (QID) | RECTAL | 0 refills | Status: DC | PRN

## 2019-03-15 MED ORDER — HALOPERIDOL LACTATE 5 MG/ML IJ SOLN
2.5000 mg | Freq: Once | INTRAMUSCULAR | Status: AC
  Administered 2019-03-15: 22:00:00 2.5 mg via INTRAVENOUS
  Filled 2019-03-15: qty 1

## 2019-03-15 NOTE — Discharge Instructions (Addendum)
Your labs and CT scan of the abdomen were all okay today. Please continue to follow up with your doctors.

## 2019-03-15 NOTE — ED Notes (Signed)
Pt presents to ED via POV reports x 2 days having abdominal cramping and NVD. Pt reports had surgery in May to mark her tumors in her stomach, reports having 4 tumors in her stomach at this time. Pt reports having had an endoscopy to tattoo the tumors. Pt states she was told they couldn't remove the tumors due to having lost too much weight and due to her having CHF/COPD and a pacemaker/defibrillator. Pt states was told by her oncologist to come to ED for eval. Pt reports cramping x 2 days, vomiting since yesterday. Pt also reports that she has chronic diarrhea. Pt is A&O x4 at this time, NAD noted.

## 2019-03-15 NOTE — ED Provider Notes (Signed)
[facility] Emergency Department Provider Note  ____________________________________________  Time seen: Approximately 10:11 PM  I have reviewed the triage vital signs and the nursing notes.   HISTORY  Chief Complaint Abdominal Pain    HPI Brittany Nguyen is a 53 y.o. female with a history of CHF, COPD, DVT, gastric carcinoid tumor who comes the ED complaining of upper abdominal pain for the past 2 days, described as crampy, nonradiating, associated with nausea and vomiting.  Constant, waxing and waning, no aggravating or alleviating factors.  She also has chronic diarrhea which is unchanged and not bloody or melanotic.  Denies fever or chills or sweats.  Has been unable to eat or drink for the past 2 days and was told to come here by her doctor to be evaluated for dehydration.  Had an upper endoscopy about a month ago for tattooing of cancer lesions for future surgical planning.      Past Medical History:  Diagnosis Date  . Asthma 2013  . 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 CHF (congestive heart failure) ([place])   . COPD (chronic obstructive pulmonary disease) ([place])   . Diverticulitis 2010  . DVT (deep venous thrombosis) ([place])   . Endometriosis 1990  . Heart disease 2013  . Hx MRSA infection   . Iron deficiency   . Lump or mass in breast   . Restless leg   . Stomach cancer [facility])      Patient Active Problem List   Diagnosis Date Noted  . Iron deficiency anemia 03/27/2018  . Anemia 03/26/2018  . Abdominal pain 02/20/2018  . Unstable angina ([place])   . Encounter for anticoagulation discussion and counseling   . C. difficile colitis 01/14/2018  . GIB (gastrointestinal bleeding) 01/08/2018  . SOB (shortness of breath) 11/01/2017  . Influenza A 10/29/2017  . Acute respiratory failure ([place]) 09/24/2017  . Sepsis ([place]) 07/03/2017  . COPD (chronic obstructive pulmonary disease) ([place]) 04/26/2017  . Community acquired pneumonia  04/25/2017  . Chronic systolic heart failure ([place]) 02/03/2017  . Hypotension 02/03/2017  . Chest pain 12/09/2016  . RLS (restless legs syndrome) 12/09/2016  . Cough, persistent 11/22/2015  . Hypokalemia 11/22/2015  . Leukocytosis 11/22/2015  . Dyspnea 11/21/2015  . Respiratory distress 09/19/2015  . Breast pain 01/11/2013  . Hx MRSA infection   . Lump or mass in breast   . GOITER, MULTINODULAR 01/20/2009  . HYPOGLYCEMIA, UNSPECIFIED 01/20/2009  . GERD 01/20/2009  . DIVERTICULITIS OF COLON 01/20/2009     Past Surgical History:  Procedure Laterality Date  . ABDOMINAL HYSTERECTOMY     age 63  . BREAST BIOPSY Right 2014   benign  . CARDIAC DEFIBRILLATOR PLACEMENT    . CARDIAC DEFIBRILLATOR PLACEMENT    . CESAREAN SECTION    . CHOLECYSTECTOMY    . COLECTOMY    . COLONOSCOPY WITH PROPOFOL N/A 02/22/2018   Procedure: COLONOSCOPY WITH PROPOFOL;  Surgeon: Toledo, Benay Pike, MD;  Location: [facility] ENDOSCOPY;  Service: Gastroenterology;  Laterality: N/A;  . ESOPHAGOGASTRODUODENOSCOPY (EGD) WITH PROPOFOL N/A 02/22/2018   Procedure: ESOPHAGOGASTRODUODENOSCOPY (EGD) WITH PROPOFOL;  Surgeon: Toledo, Benay Pike, MD;  Location: [facility] ENDOSCOPY;  Service: Gastroenterology;  Laterality: N/A;  . NASAL SINUS SURGERY  2012  . OOPHORECTOMY       Prior to Admission medications   Medication Sig Start Date End Date Taking? Authorizing Provider  albuterol (ACCUNEB) 1.25 MG/3ML nebulizer solution Take 3 mLs by nebulization every 6 (six) hours as needed for  wheezing. 11/09/17   [provider]  albuterol (VENTOLIN HFA) 108 (90 Base) MCG/ACT inhaler Inhale 2 puffs into the lungs every 6 (six) hours as needed for shortness of breath. 08/28/16   [provider]  baclofen (LIORESAL) 10 MG tablet Take 10 mg by mouth 2 (two) times daily. 06/11/18   [provider]  beclomethasone (QVAR) 80 MCG/ACT inhaler Inhale 2 puffs into the lungs 2 (two) times daily. 06/05/17 10/03/18  Laverle Hobby, MD  busPIRone (BUSPAR) 15 MG tablet Take 15 mg by mouth 2 (two) times daily. 05/08/18   [provider]  doxycycline (VIBRAMYCIN) 100 MG capsule Take 1 capsule (100 mg total) by mouth 2 (two) times daily. 02/27/19   Johnn Hai, PA-C  famotidine (PEPCID) 20 MG tablet Take 1 tablet (20 mg total) by mouth 2 (two) times daily. 03/15/19   Carrie Mew, MD  fentaNYL (DURAGESIC) 50 MCG/HR Place 1 patch onto the skin every 3 (three) days. 10/08/18   Epifanio Lesches, MD  furosemide (LASIX) 40 MG tablet Take 40 mg by mouth 2 (two) times daily. 07/09/18   [provider]  levothyroxine (SYNTHROID, LEVOTHROID) 150 MCG tablet Take 150 mcg by mouth daily before breakfast.    [provider]  losartan (COZAAR) 25 MG tablet Take 25 mg by mouth daily.     [provider]  meloxicam (MOBIC) 15 MG tablet Take 15 mg by mouth daily. 08/30/18   [provider]  metoprolol succinate (TOPROL-XL) 50 MG 24 hr tablet Take 100 mg by mouth daily. 10/08/18 10/08/19  [provider]  nitroGLYCERIN (NITROSTAT) 0.4 MG SL tablet Place 1 tablet (0.4 mg total) under the tongue every 5 (five) minutes as needed for chest pain. 01/24/17   Henreitta Leber, MD  omeprazole (PRILOSEC) 40 MG capsule Take 40 mg by mouth 2 (two) times daily. 10/19/18   [provider]  oxyCODONE-acetaminophen (PERCOCET) 5-325 MG tablet Take 1 tablet by mouth every 6 (six) hours as needed for severe pain. 02/27/19   Johnn Hai, PA-C  potassium chloride SA (KLOR-CON M20) 20 MEQ tablet Take 1 tablet (20 mEq total) by mouth 2 (two) times daily for 7 days. Patient taking differently: Take 40 mEq by mouth daily as needed. 1 tablet twice a day 06/17/18 10/03/18  Hinda Kehr, MD  promethazine (PHENERGAN) 25 MG suppository Place 1 suppository (25 mg total) rectally every 6 (six) hours as needed for nausea. 03/15/19   Carrie Mew, MD  rOPINIRole (REQUIP) 2 MG tablet Take 2 tablets (4  mg total) by mouth at bedtime. 2-4  Mg per night Patient taking differently: Take 4 mg by mouth at bedtime.  01/16/18   Loletha Grayer, MD  spironolactone (ALDACTONE) 50 MG tablet Take 50 mg by mouth daily. 11/26/18   [provider]  sucralfate (CARAFATE) 1 g tablet Take 1 tablet (1 g total) by mouth 4 (four) times daily. 03/15/19   Carrie Mew, MD  venlafaxine XR (EFFEXOR-XR) 75 MG 24 hr capsule Take 75 mg by mouth daily. 11/14/18 11/14/19  [provider]     Allergies Contrast media [iodinated diagnostic agents], Metrizamide, Penicillins, Isosorbide nitrate, Latex, Lidocaine, Ondansetron, Zofran [ondansetron hcl], Hydromorphone, Povidone-iodine, and Pulmicort [budesonide]   Family History  Problem Relation Age of Onset  . Cancer Mother 30       ovarian  . CAD Mother   . Cancer Father 40       brain  . CAD Father   . Cancer  Daughter 18       skin  . Cancer Maternal Aunt 58       breast  . Leukemia Paternal Grandfather     Social History Social History   Tobacco Use  . Smoking status: Never Smoker  . Smokeless tobacco: Never Used  Substance Use Topics  . Alcohol use: No  . Drug use: No    Review of Systems  Constitutional:   No fever or chills.  ENT:   No sore throat. No rhinorrhea. Cardiovascular:   No chest pain or syncope. Respiratory:   No dyspnea or cough. Gastrointestinal:   Positive as above for abdominal pain, vomiting and diarrhea.  Musculoskeletal:   Negative for focal pain or swelling All other systems reviewed and are negative except as documented above in ROS and HPI.  ____________________________________________   PHYSICAL EXAM:  VITAL SIGNS: ED Triage Vitals [03/15/19 1723]  Enc Vitals Group     BP 119/83     Pulse Rate 93     Resp 18     Temp 98.5 F (36.9 C)     Temp Source Oral     SpO2 97 %     Weight 210 lb (95.3 kg)     Height 5\' 5"  (1.651 m)     Head Circumference      Peak Flow      Pain Score 9     Pain  Loc      Pain Edu?      Excl. in [place]?     Vital signs reviewed, nursing assessments reviewed.   Constitutional:   Alert and oriented. Non-toxic appearance. Eyes:   Conjunctivae are normal. EOMI. PERRL. ENT      Head:   Normocephalic and atraumatic.      Nose:   No congestion/rhinnorhea.       Mouth/Throat:   Dry mucous membranes, no pharyngeal erythema. No peritonsillar mass.       Neck:   No meningismus. Full ROM. Hematological/Lymphatic/Immunilogical:   No cervical lymphadenopathy. Cardiovascular:   RRR. Symmetric bilateral radial and DP pulses.  No murmurs. Cap refill less than 2 seconds. Respiratory:   Normal respiratory effort without tachypnea/retractions. Breath sounds are clear and equal bilaterally. No wheezes/rales/rhonchi. Gastrointestinal:   Soft with diffuse abdominal tenderness, worse in the left upper quadrant and epigastrium.. Non distended. There is no CVA tenderness.  No rebound, rigidity, or guarding. Musculoskeletal:   Normal range of motion in all extremities. No joint effusions.  No lower extremity tenderness.  No edema. Neurologic:   Normal speech and language.  Motor grossly intact. No acute focal neurologic deficits are appreciated.  Skin:    Skin is warm, dry and intact. No rash noted.  No petechiae, purpura, or bullae.  ____________________________________________    LABS (pertinent positives/negatives) (all labs ordered are listed, but only abnormal results are displayed) Labs Reviewed  COMPREHENSIVE METABOLIC PANEL - Abnormal; Notable for the following components:      Result Value   Potassium 3.3 (*)    Glucose, Bld 104 (*)    Creatinine, Ser 1.04 (*)    Calcium 8.8 (*)    All other components within normal limits  URINALYSIS, COMPLETE (UACMP) WITH MICROSCOPIC - Abnormal; Notable for the following components:   Color, Urine YELLOW (*)    APPearance CLEAR (*)    Leukocytes,Ua TRACE (*)    Bacteria, UA RARE (*)    All other components within  normal limits  LIPASE, BLOOD  CBC  ____________________________________________   EKG  Interpreted by me Normal sinus rhythm rate of 87, left axis, normal intervals.  Poor R wave progression.  Normal ST segments and T waves.  ____________________________________________    RADIOLOGY  Ct Abdomen Pelvis Wo Contrast  Result Date: 03/15/2019 CLINICAL DATA:  Upper abdominal pain, history of stomach carcinoma EXAM: CT ABDOMEN AND PELVIS WITHOUT CONTRAST TECHNIQUE: Multidetector CT imaging of the abdomen and pelvis was performed following the standard protocol without IV contrast. COMPARISON:  09/23/2018 FINDINGS: Lower chest: No acute abnormality. Hepatobiliary: Gallbladder has been surgically removed. The liver is within normal limits. Pancreas: Unremarkable. No pancreatic ductal dilatation or surrounding inflammatory changes. Spleen: Normal in size without focal abnormality. Adrenals/Urinary Tract: Adrenal glands are within normal limits. The kidneys show no renal calculi or obstructive changes. The ureters are within normal limits. Bladder is decompressed. Stomach/Bowel: Postsurgical changes in the sigmoid colon are seen without obstructive change. The appendix is not visualized. No inflammatory changes are seen to suggest appendicitis. No small bowel abnormality is noted. The stomach is decompressed. A fatty lesion along the anterior margin of the liver is again identified and stable in appearance. Vascular/Lymphatic: No significant vascular findings are present. No enlarged abdominal or pelvic lymph nodes. Reproductive: Status post hysterectomy. No adnexal masses. Other: No abdominal wall hernia or abnormality. No abdominopelvic ascites. Musculoskeletal: No acute or significant osseous findings. IMPRESSION: Stable fatty gastric lesion similar to that seen on the prior exam. Chronic changes without acute abnormality. Electronically Signed   By: Inez Catalina M.D.   On: 03/15/2019 22:02     ____________________________________________   PROCEDURES Procedures  ____________________________________________  DIFFERENTIAL DIAGNOSIS   Pancreatitis, gastritis, diverticulitis, gastric perforation, intra-abdominal abscess, enteritis.  Doubt AAA dissection mesenteric ischemia  CLINICAL IMPRESSION / ASSESSMENT AND PLAN / ED COURSE  Medications ordered in the ED: Medications  pantoprazole (PROTONIX) injection 40 mg (has no administration in time range)  sucralfate (CARAFATE) tablet 1 g (has no administration in time range)  fentaNYL (SUBLIMAZE) injection 25 mcg (25 mcg Intravenous Given 03/15/19 1735)  sodium chloride 0.9 % bolus 1,000 mL (1,000 mLs Intravenous New Bag/Given 03/15/19 2131)  HYDROmorphone (DILAUDID) injection 1 mg (1 mg Intravenous Given 03/15/19 2135)  haloperidol lactate (HALDOL) injection 2.5 mg (2.5 mg Intravenous Given 03/15/19 2131)    Pertinent labs & imaging results that were available during my care of the patient were reviewed by me and considered in my medical decision making (see chart for details).  Brittany Nguyen was evaluated in Emergency Department on 03/15/2019 for the symptoms described in the history of present illness. She was evaluated in the context of the global COVID-19 pandemic, which necessitated consideration that the patient might be at risk for infection with the SARS-CoV-2 virus that causes COVID-19. Institutional protocols and algorithms that pertain to the evaluation of patients at risk for COVID-19 are in a state of rapid change based on information released by regulatory bodies including the CDC and federal and state organizations. These policies and algorithms were followed during the patient's care in the ED.   Patient presents with 2 days of abdominal cramping with nausea vomiting and diarrhea.  Clinically she appears mildly dehydrated although her vital signs are normal.  Doubt vascular pathology, no pain out of proportion to  exam.  Labs are unremarkable.  CT scan of the abdomen and pelvis could be done without contrast only due to her history of contrast allergy with breakthrough allergy symptoms even after a long premedication protocol.  The scan  was also unremarkable without any acute findings other than the known tumor.  Patient is received IV fluids, antiemetics (haldol 2.5mg  IV), analgesics (dilaudid 1mg  IV).  At this point I will also give antacids and p.o. trial.  Anticipate discharge home.  ----------------------------------------- 3:58 PM on 03/20/2019 -----------------------------------------  Late entry note.  Patient improved after supportive care, was able to tolerate oral intake and be discharged home to follow-up with primary care.      ____________________________________________   FINAL CLINICAL IMPRESSION(S) / ED DIAGNOSES    Final diagnoses:  Gastritis without bleeding, unspecified chronicity, unspecified gastritis type  Pain of upper abdomen     ED Discharge Orders         Ordered    sucralfate (CARAFATE) 1 g tablet  4 times daily     03/15/19 2211    famotidine (PEPCID) 20 MG tablet  2 times daily     03/15/19 2211    promethazine (PHENERGAN) 25 MG suppository  Every 6 hours PRN     03/15/19 2211          Portions of this note were generated with dragon dictation software. Dictation errors may occur despite best attempts at proofreading.   Carrie Mew, MD 03/20/19 1558

## 2019-03-15 NOTE — ED Notes (Signed)
Patient transported to CT 

## 2019-03-15 NOTE — ED Triage Notes (Addendum)
Here for upper abdominal pain. Recently dx with stomach CA. Surgery this past Tuesday to size tumors and scheduled for removal next month along with RT/chemo.  Also has had NVD last 2 days. No fever. Unlabored. Appears in pain at this time. Pt has fentanyl patch on.

## 2019-03-27 IMAGING — CT CT ABD-PELV W/O CM
2 of 4 series · 16 of 46 positions shown, 18 images · non-contrast
Comparison: 02/01/2016 CT.

CLINICAL DATA: 51-year-old female with history of diverticulitis
and endometriosis presenting with lower abdominal cramping and
diarrhea with dark and bright red blood ongoing for the past 2 days.
Followed for recurrent C difficile colitis. Stool positive for C
difficile despite vancomycin treatment. Prior cholecystectomy,
hysterectomy and colectomy. Initial encounter.

EXAM:
CT ABDOMEN AND PELVIS WITHOUT CONTRAST
TECHNIQUE: Multidetector CT imaging of the abdomen and pelvis was performed
following the standard protocol without IV contrast.

[Series 2: routine abd/pel wo · axial · 0.75mm/px · z∈[-1046,-641]mm · 13 of 89 slices shown, 15 images]
[im 4/89  soft-tissue]
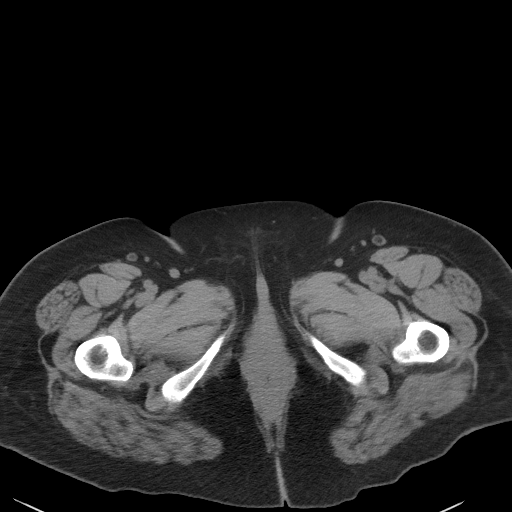
[im 4/89  bone]
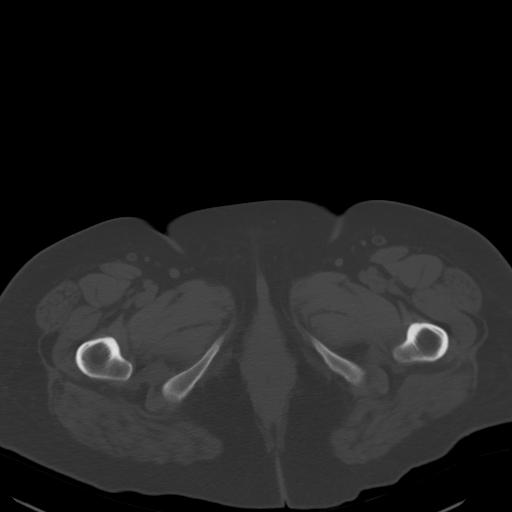
[im 11/89  soft-tissue]
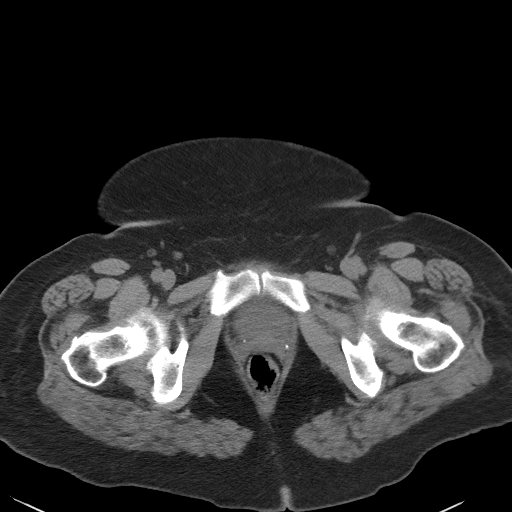
[im 18/89  soft-tissue]
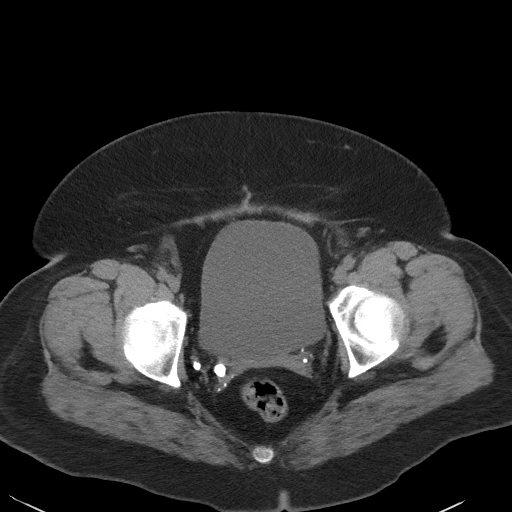
[im 25/89  soft-tissue]
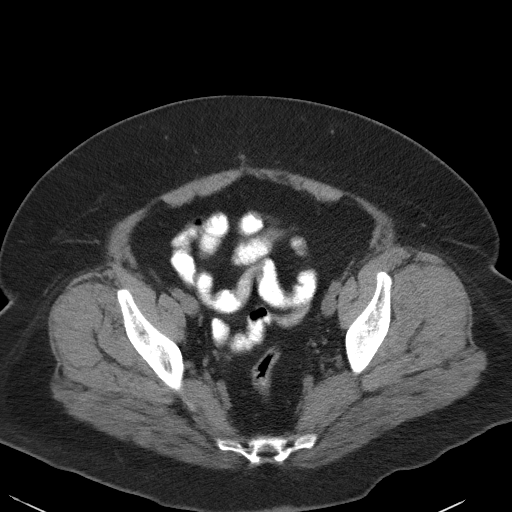
[im 32/89  soft-tissue]
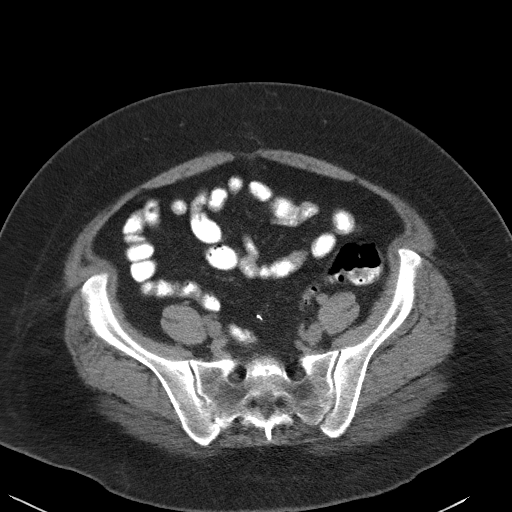
[im 39/89  soft-tissue]
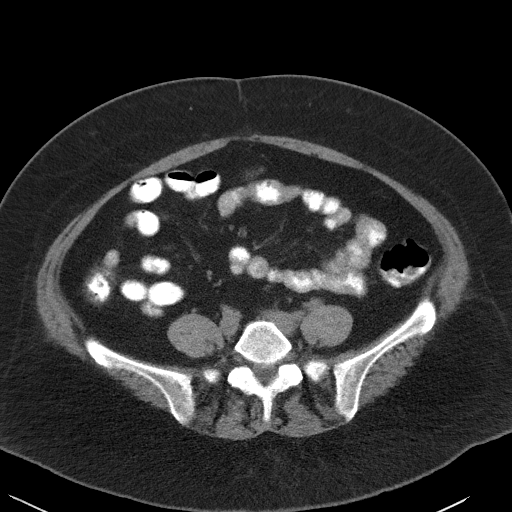
[im 46/89  soft-tissue]
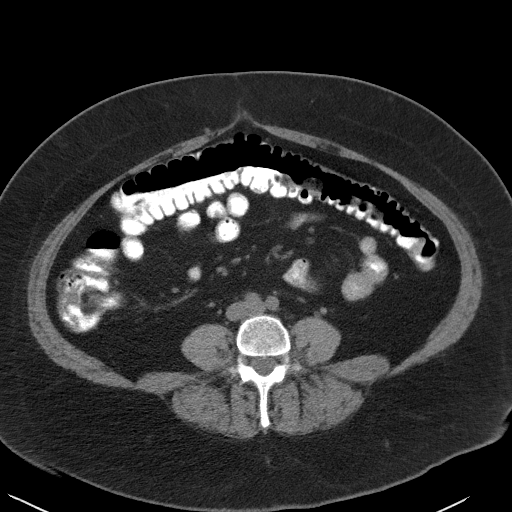
[im 50/89  soft-tissue]
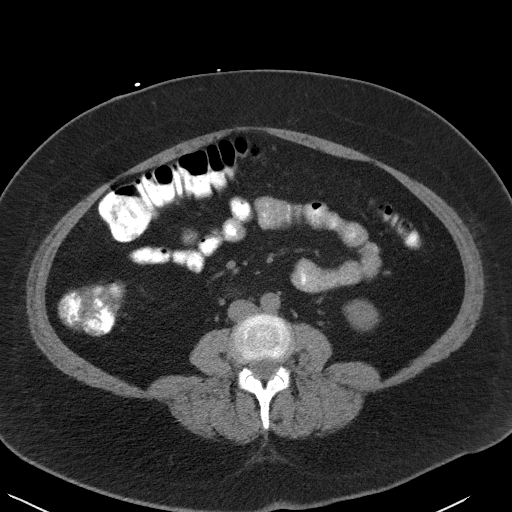
[im 57/89  soft-tissue]
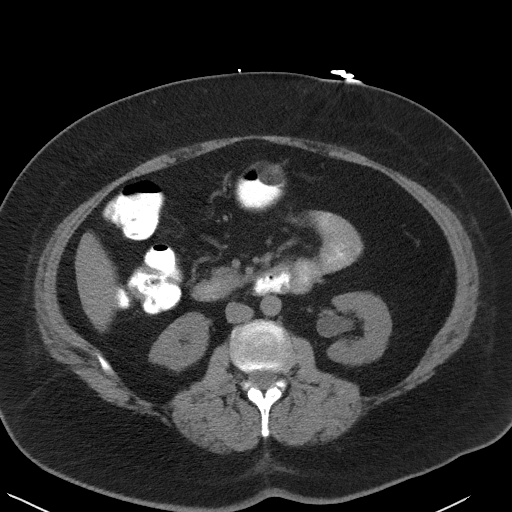
[im 57/89  bone]
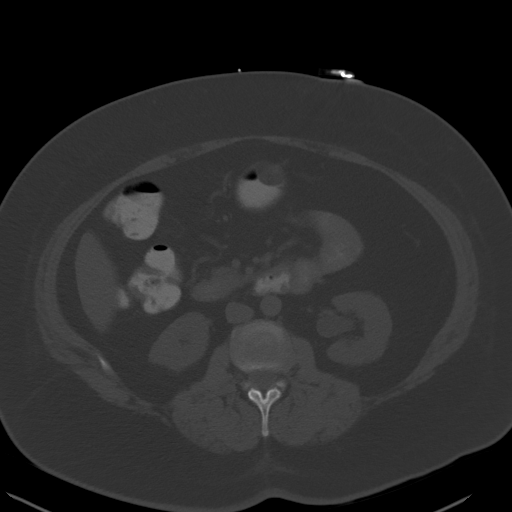
[im 64/89  soft-tissue]
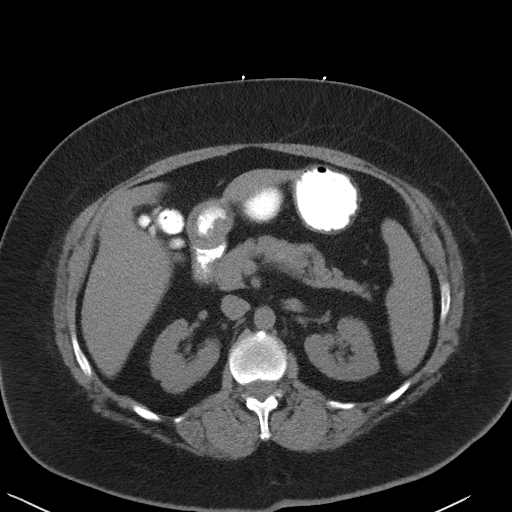
[im 71/89  soft-tissue]
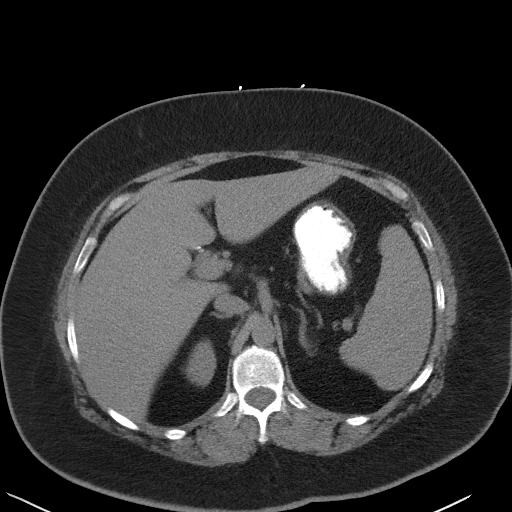
[im 78/89  soft-tissue]
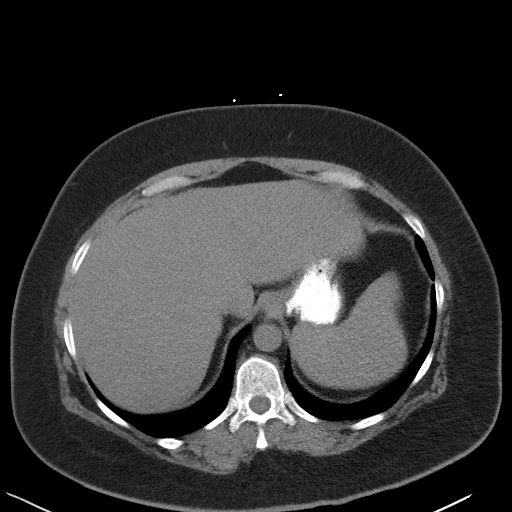
[im 85/89  soft-tissue]
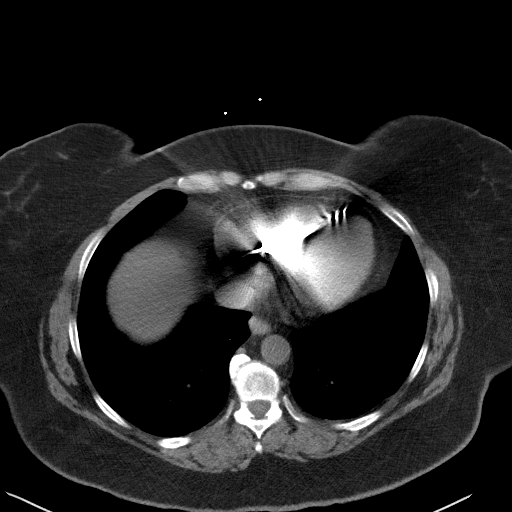

[Series 5: coronal st · coronal · 0.86mm/px · 3 of 107 slices shown]
[im 36/107  soft-tissue]
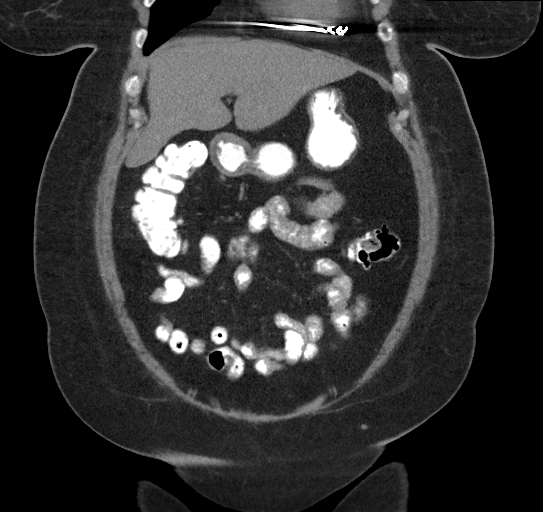
[im 48/107  soft-tissue]
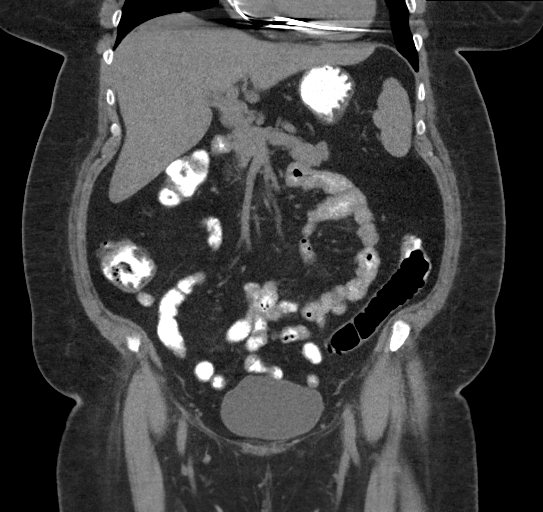
[im 59/107  soft-tissue]
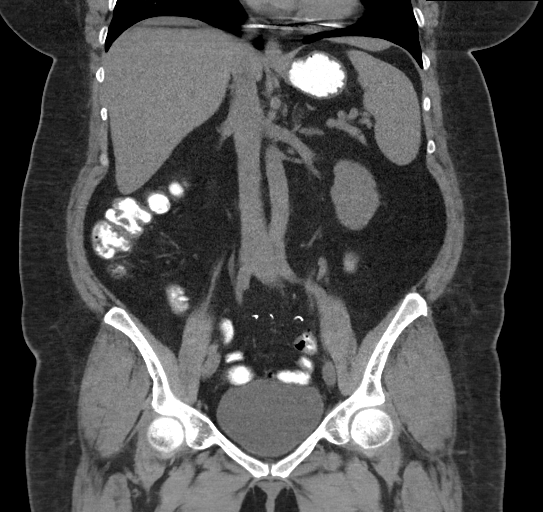

[16 of 46 positions shown; findings below may reference images not displayed]

FINDINGS: Lower chest: Stable left base 2.5 mm nodule. AICD leads in the
region of the right atrium and right ventricle. Heart size
top-normal.

Hepatobiliary: Mild fatty infiltration of liver. Taking into account
limitation by non contrast imaging, no focal worrisome hepatic
lesion. Post cholecystectomy. No calcified common bile duct stone.

Pancreas: Taking into account limitation by non contrast imaging, no
worrisome pancreatic mass or infiltration.

Spleen: Top normal size spleen. Taking into account limitation by
non contrast imaging, no focal splenic lesion.

Adrenals/Urinary Tract: No obstructing stone or hydronephrosis.
Taking into account limitation by non contrast imaging, no worrisome
renal adrenal or bladder lesion.

Stomach/Bowel: Post sigmoid resection. No colonic inflammatory
process identified. Scattered diverticula.

Appendix not visualized and may been surgically removed.

No small bowel abnormality noted.

2.4 cm fatty lesion suspected within the anterior inferior wall of
the gastric body/antrum junction (series 2, image 33, series 6,
image 77 and series 5, image 27).

Vascular/Lymphatic: No aortic aneurysm.  No adenopathy.

Reproductive: Post hysterectomy.  No adnexal mass noted.

Other: Small fat containing hernia infraumbilical region with rent
measuring 1.1 x 0.9 cm and herniated fat measuring up to 3.2 cm.

No free intraperitoneal air.

Musculoskeletal: No worrisome osseous abnormality. Mild degenerative
changes most notable L5-S1.
IMPRESSION: Post sigmoid resection. No colonic inflammatory process identified.
Scattered diverticula.

2.4 cm fatty lesion suspected within the anterior inferior wall of
the gastric body/antrum junction (series 2, image 33, series 6,
image 77 and series 5, image 27).

Post hysterectomy and cholecystectomy.

Small fat containing hernia infraumbilical region.

AICD in place.

Mild fatty infiltration liver.

## 2019-04-01 IMAGING — CT CT ABD-PELV W/O CM
2 of 5 series · 17 of 46 positions shown, 19 images · non-contrast
Comparison: 01/09/2018 and prior CTs

CLINICAL DATA: 51-year-old female with acute abdominal and pelvic
pain and nausea.

EXAM:
CT ABDOMEN AND PELVIS WITHOUT CONTRAST
TECHNIQUE: Multidetector CT imaging of the abdomen and pelvis was performed
following the standard protocol without IV contrast.

[Series 2: routine abd/pel wo · axial · 0.83mm/px · z∈[-461,-61]mm · 14 of 89 slices shown, 16 images]
[im 5/89  soft-tissue]
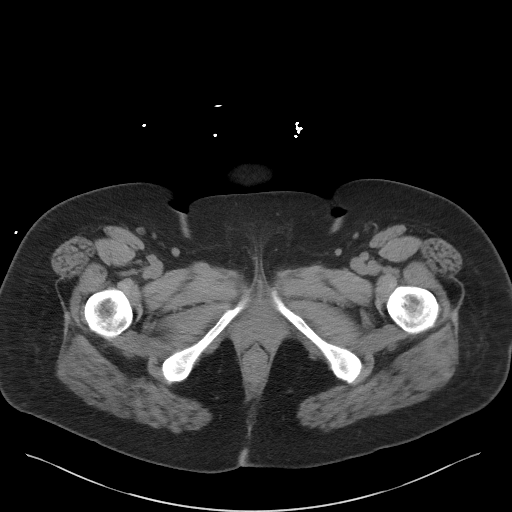
[im 5/89  bone]
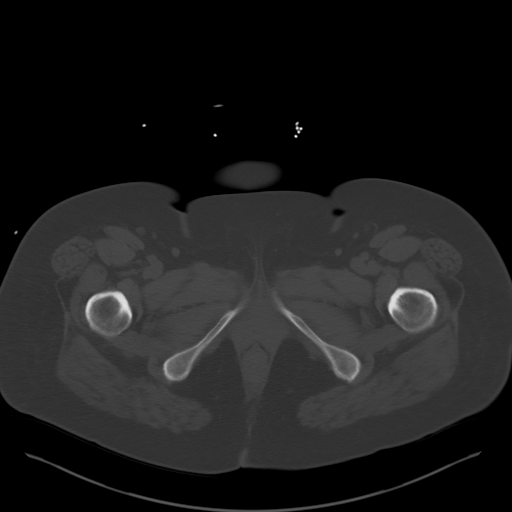
[im 13/89  soft-tissue]
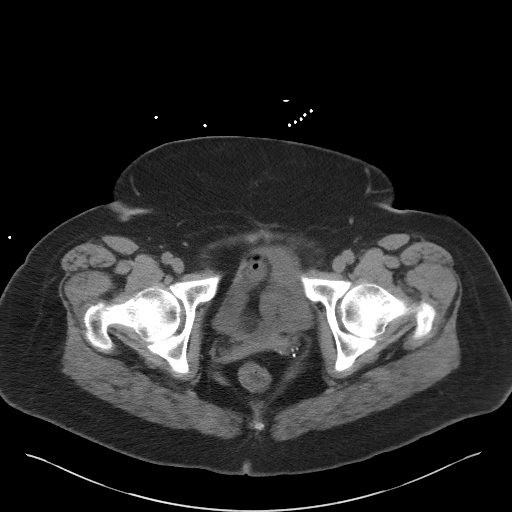
[im 17/89  soft-tissue]
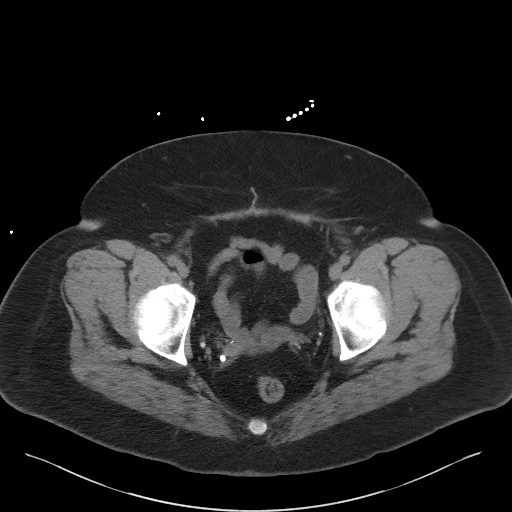
[im 25/89  soft-tissue]
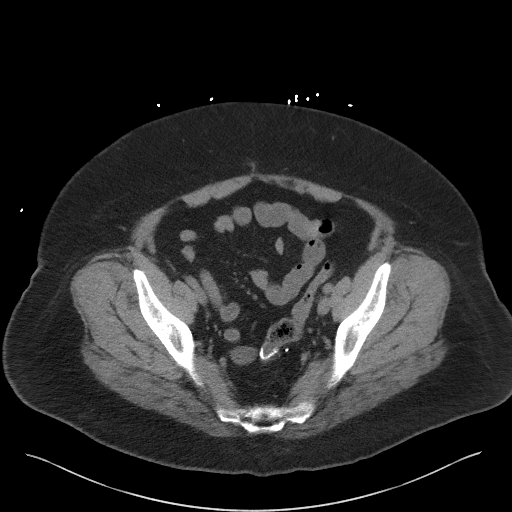
[im 29/89  soft-tissue]
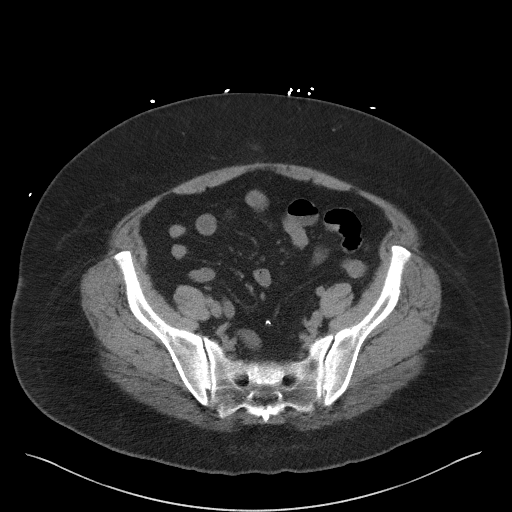
[im 37/89  soft-tissue]
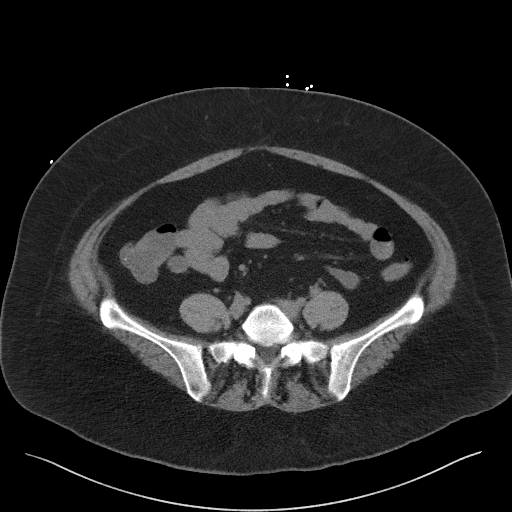
[im 41/89  soft-tissue]
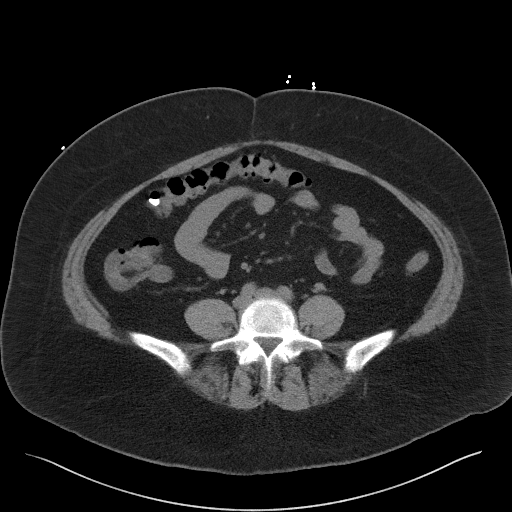
[im 49/89  soft-tissue]
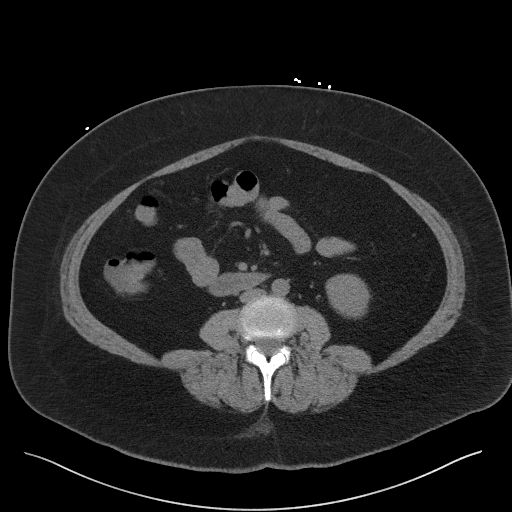
[im 53/89  soft-tissue]
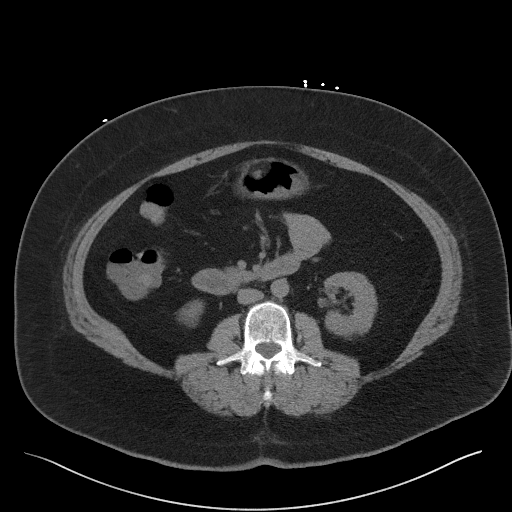
[im 53/89  bone]
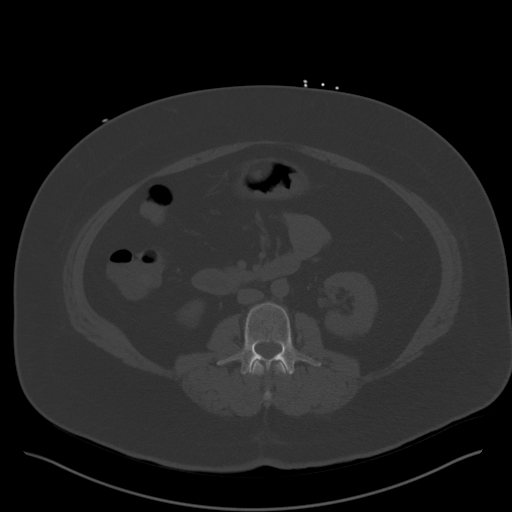
[im 61/89  soft-tissue]
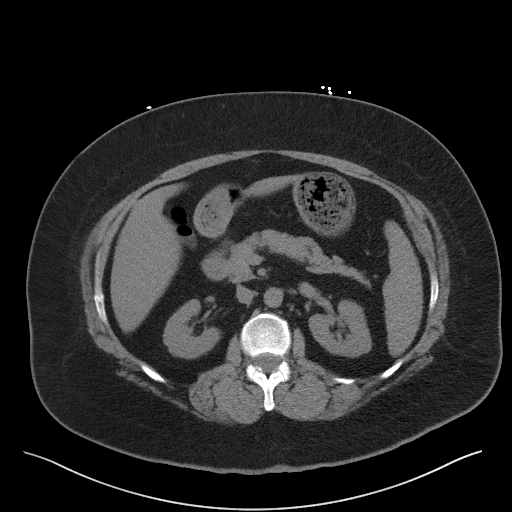
[im 65/89  soft-tissue]
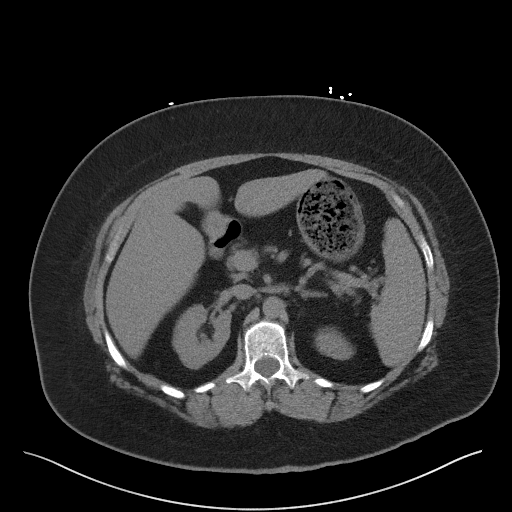
[im 73/89  soft-tissue]
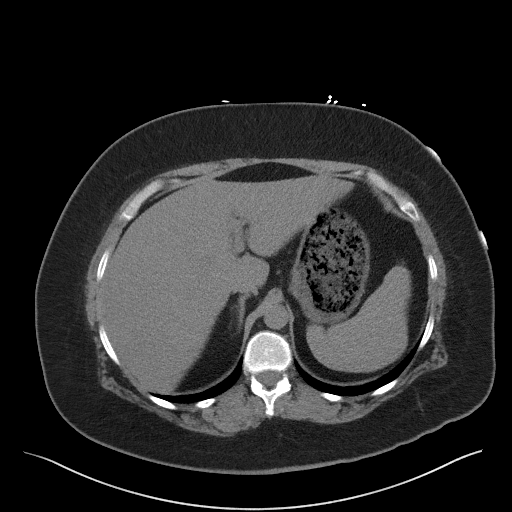
[im 77/89  soft-tissue]
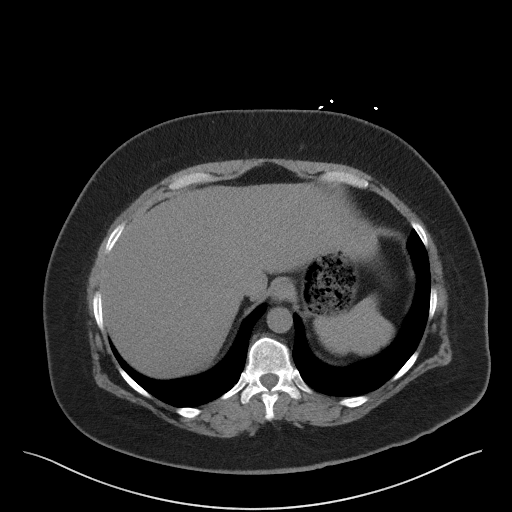
[im 85/89  soft-tissue]
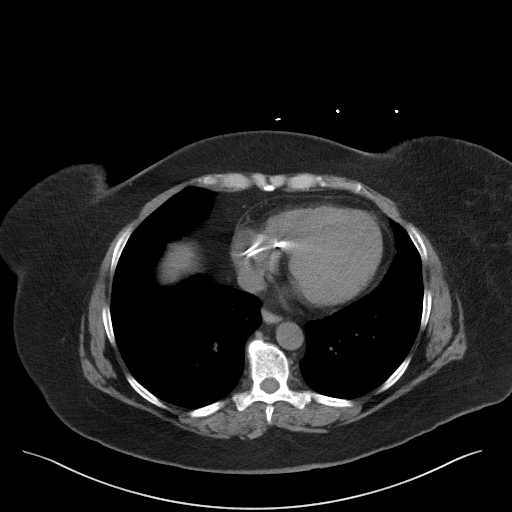

[Series 6: coronal st · coronal · 0.85mm/px · 3 of 102 slices shown]
[im 34/102  soft-tissue]
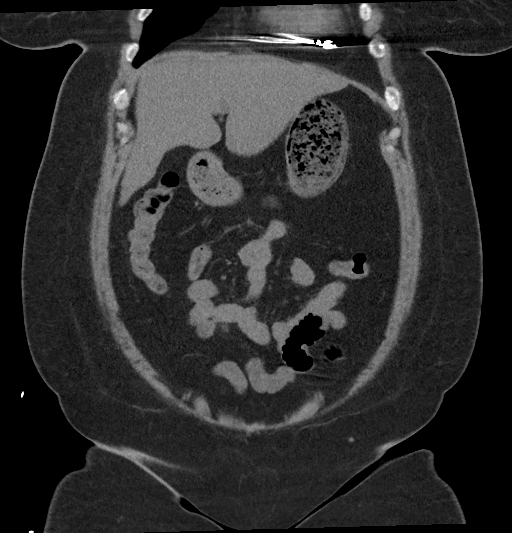
[im 45/102  soft-tissue]
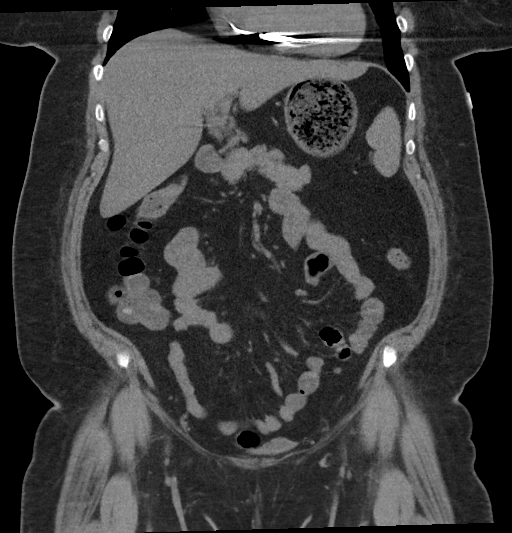
[im 57/102  soft-tissue]
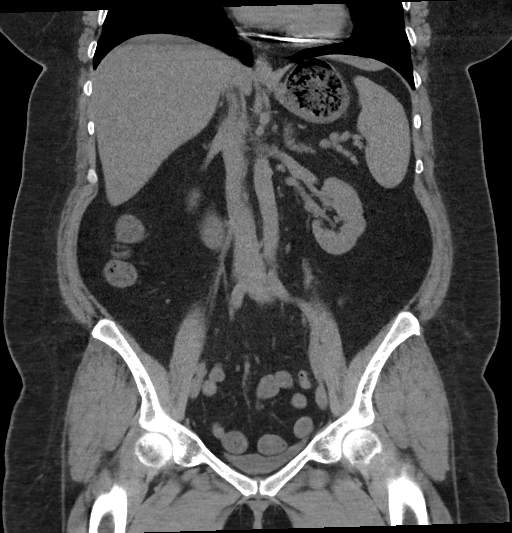

[17 of 46 positions shown; findings below may reference images not displayed]

FINDINGS: Please note that parenchymal abnormalities may be missed without
intravenous contrast.

Lower chest: No acute abnormality. Cardiac defibrillator lead is
identified.

Hepatobiliary: Mild hepatic steatosis noted. The patient is status
post cholecystectomy. No biliary dilatation.

Pancreas: Unremarkable.

Spleen: Unremarkable

Adrenals/Urinary Tract: The kidneys, adrenal glands and bladder are
unremarkable.

Stomach/Bowel: The stomach is unchanged with a stable 2.4 cm fatty
lesion within the wall of the gastric greater curvature.. No
evidence of new bowel wall thickening, distention, or inflammatory
changes. Distal colonic surgical changes again identified.

Vascular/Lymphatic: No significant vascular findings are present. No
enlarged abdominal or pelvic lymph nodes.

Reproductive: Status post hysterectomy. No adnexal masses.

Other: No ascites, focal collection or pneumoperitoneum. A small
stable infraumbilical midline ventral hernia within the UPPER pelvis
containing only fat is again noted.

Musculoskeletal: No acute or suspicious bony abnormalities.
IMPRESSION: 1. No evidence of acute abnormality. No findings to suggest a cause
for this patient's abdominal pain.
2. Mild hepatic steatosis.

## 2019-04-01 IMAGING — CR DG ABDOMEN 1V
2 series · 2 of 2 positions shown · non-contrast
Comparison: CT abdomen and pelvis 01/09/2018

CLINICAL DATA: C difficile colitis, persistent diarrhea, syncopal
episode this morning, lower abdominal pain and nausea

EXAM:
ABDOMEN - 1 VIEW

[abdomen kub (1 of 2)]
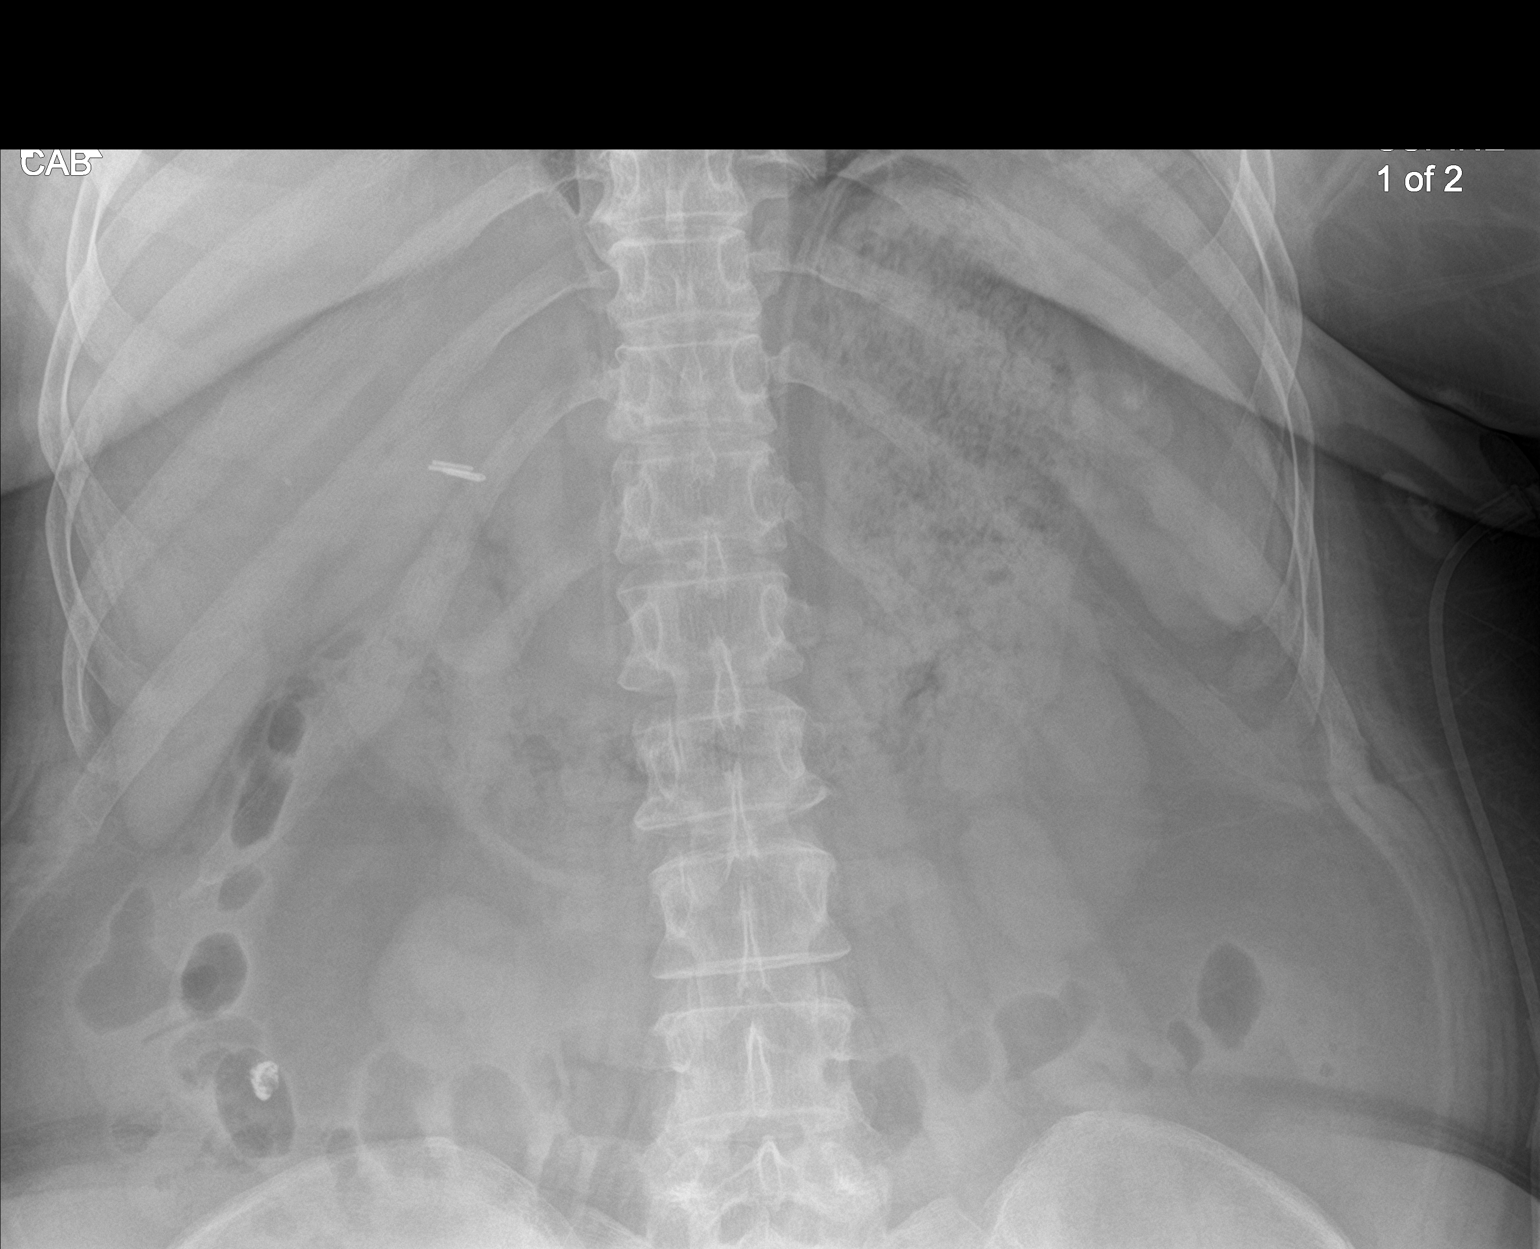

[abdomen kub (2 of 2)]
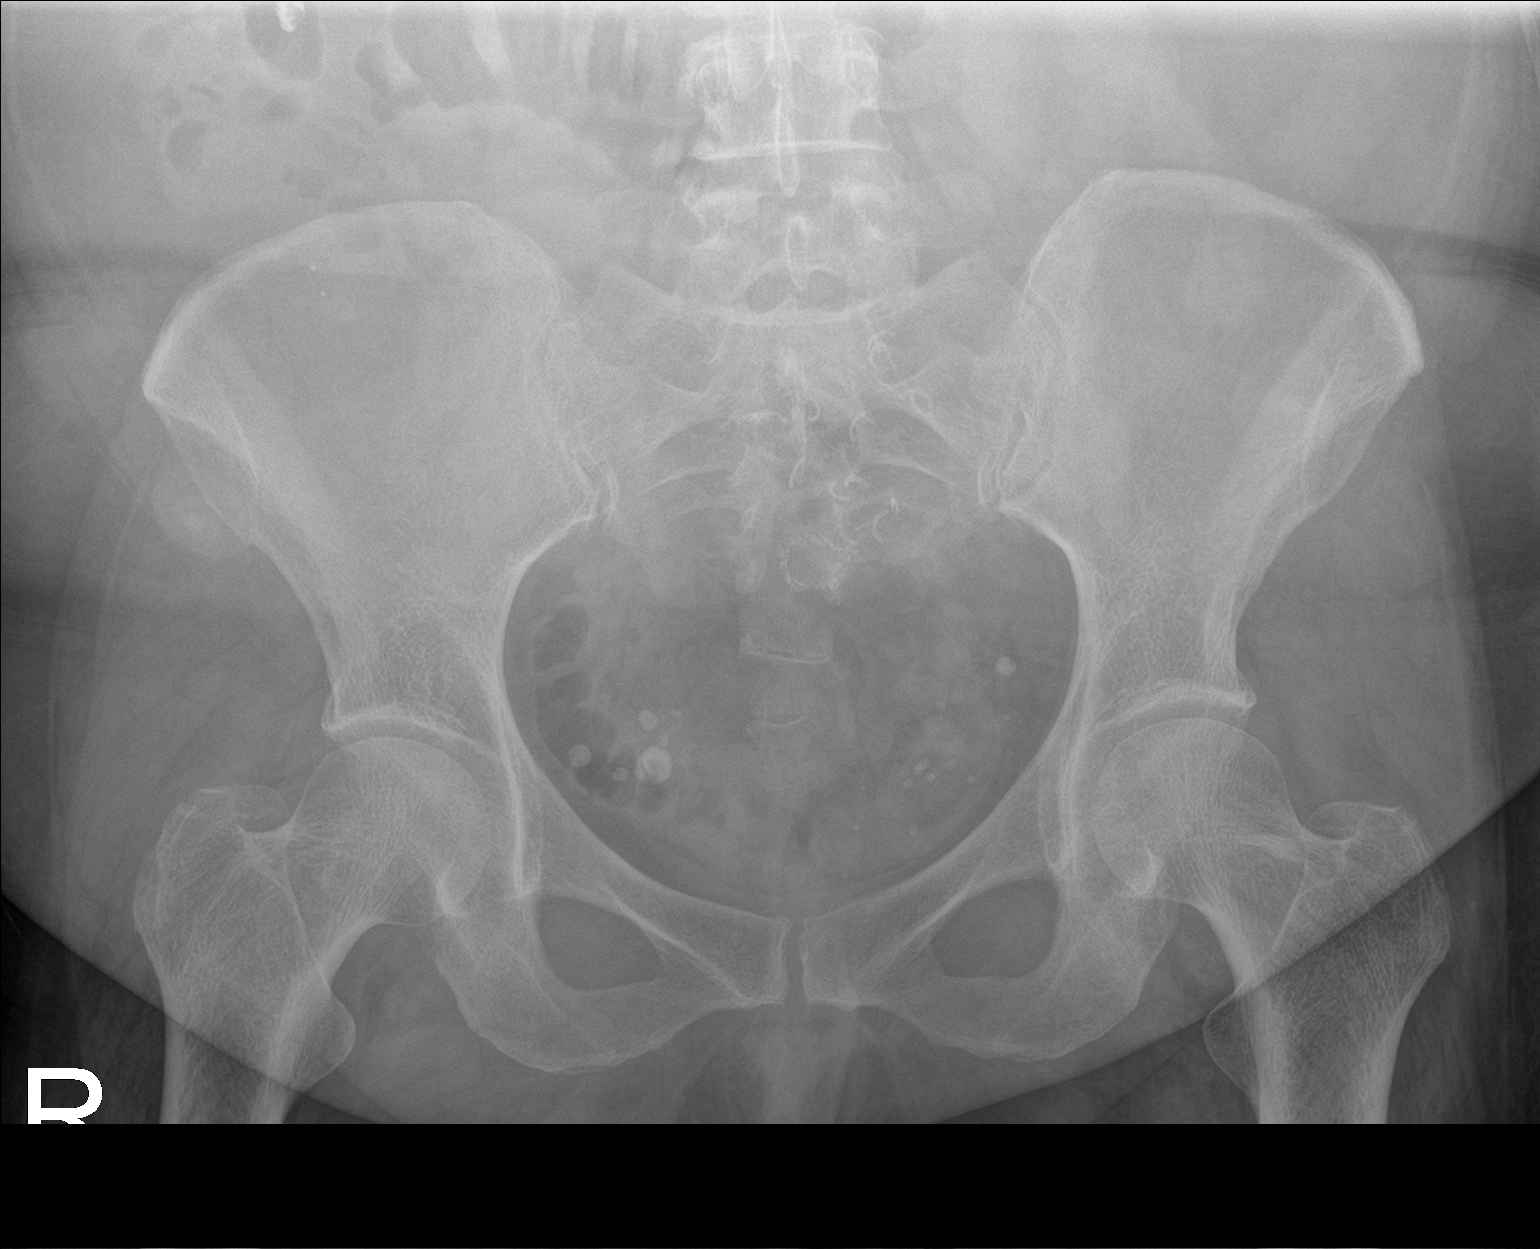

[2 of 2 positions shown; findings below may reference images not displayed]

FINDINGS: Food debris in stomach.

Surgical clips RIGHT upper quadrant consistent with cholecystectomy.

Retained contrast within a diverticulum at the ascending colon.

Normal bowel gas pattern without bowel dilatation or definite bowel
wall thickening.

Bones demineralized.

Numerous pelvic phleboliths.
IMPRESSION: Unremarkable abdominal radiograph.

## 2019-04-14 IMAGING — CR DG CHEST 2V
2 series · 2 of 2 positions shown · non-contrast
Comparison: 10/29/2017

CLINICAL DATA: Chest pain

EXAM:
CHEST - 2 VIEW

[chest pa]
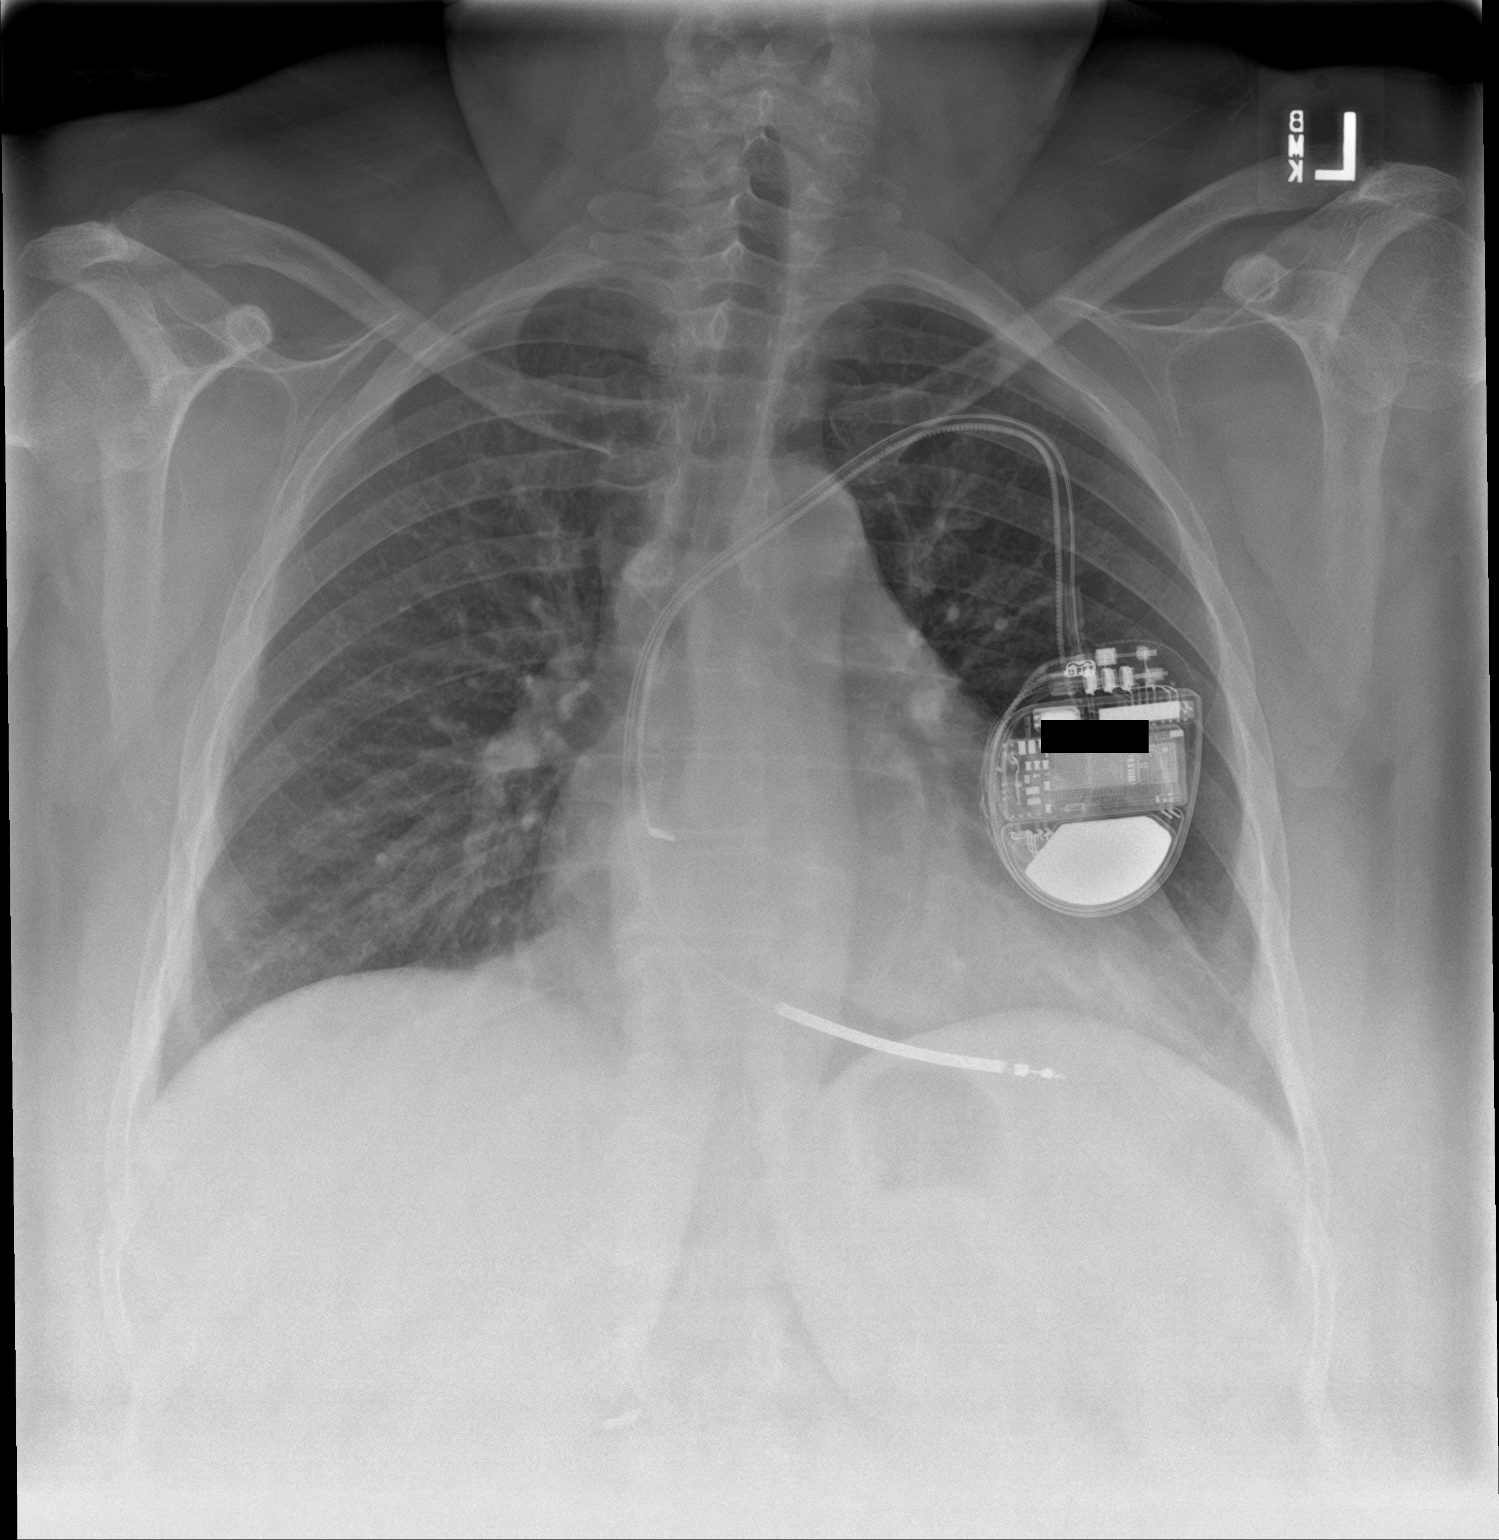

[chest lat]
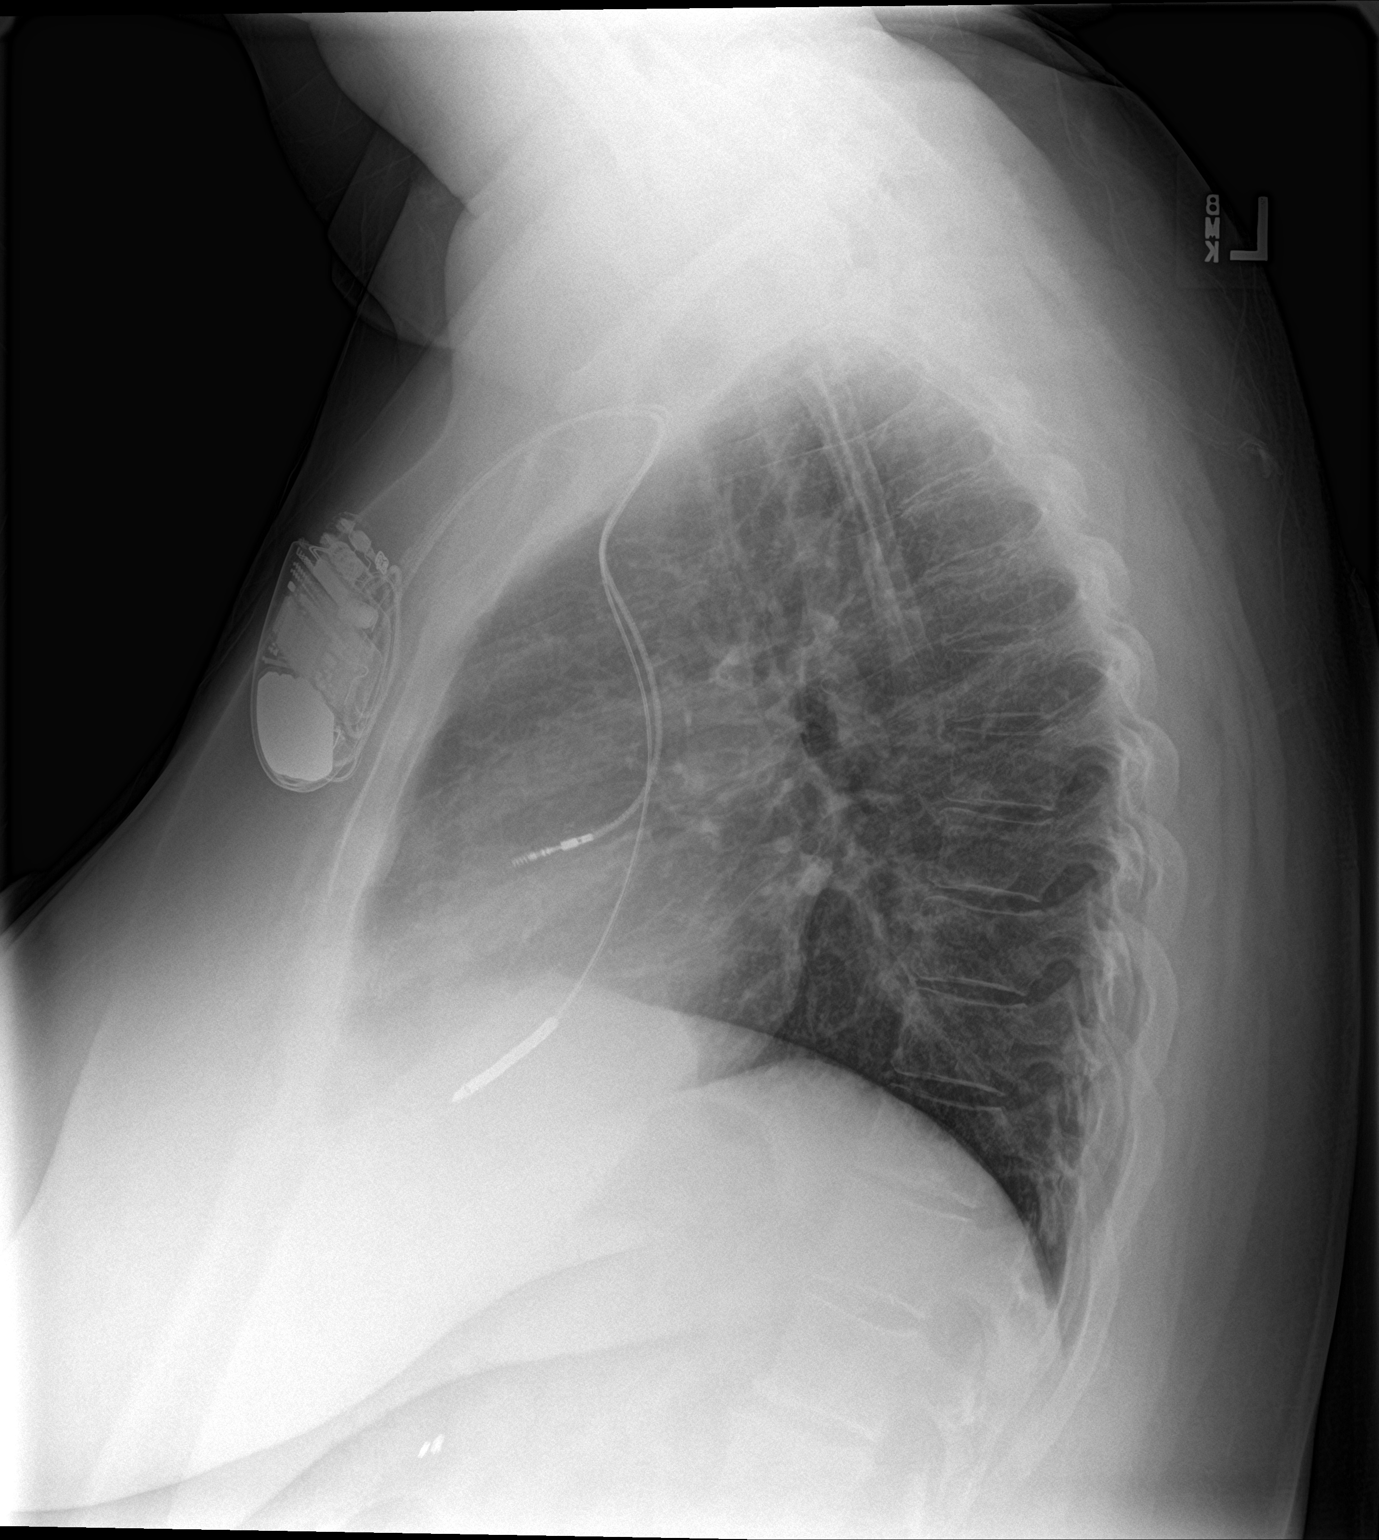

[2 of 2 positions shown; findings below may reference images not displayed]

FINDINGS: There is a leftchest wall AICDwith leads projecting within the right
atrium and right ventricle. The heart size and mediastinal contours
are within normal limits. Both lungs are clear. The visualized
skeletal structures are unremarkable.
IMPRESSION: No active cardiopulmonary disease.

## 2019-04-16 IMAGING — CR DG CHEST 2V
2 series · 2 of 2 positions shown · non-contrast
Comparison: Chest radiograph performed 01/27/2018

CLINICAL DATA: Acute onset of left-sided chest pain, radiating to
the left side of the neck. Left arm numbness. Dizziness.

EXAM:
CHEST - 2 VIEW

[chest pa]
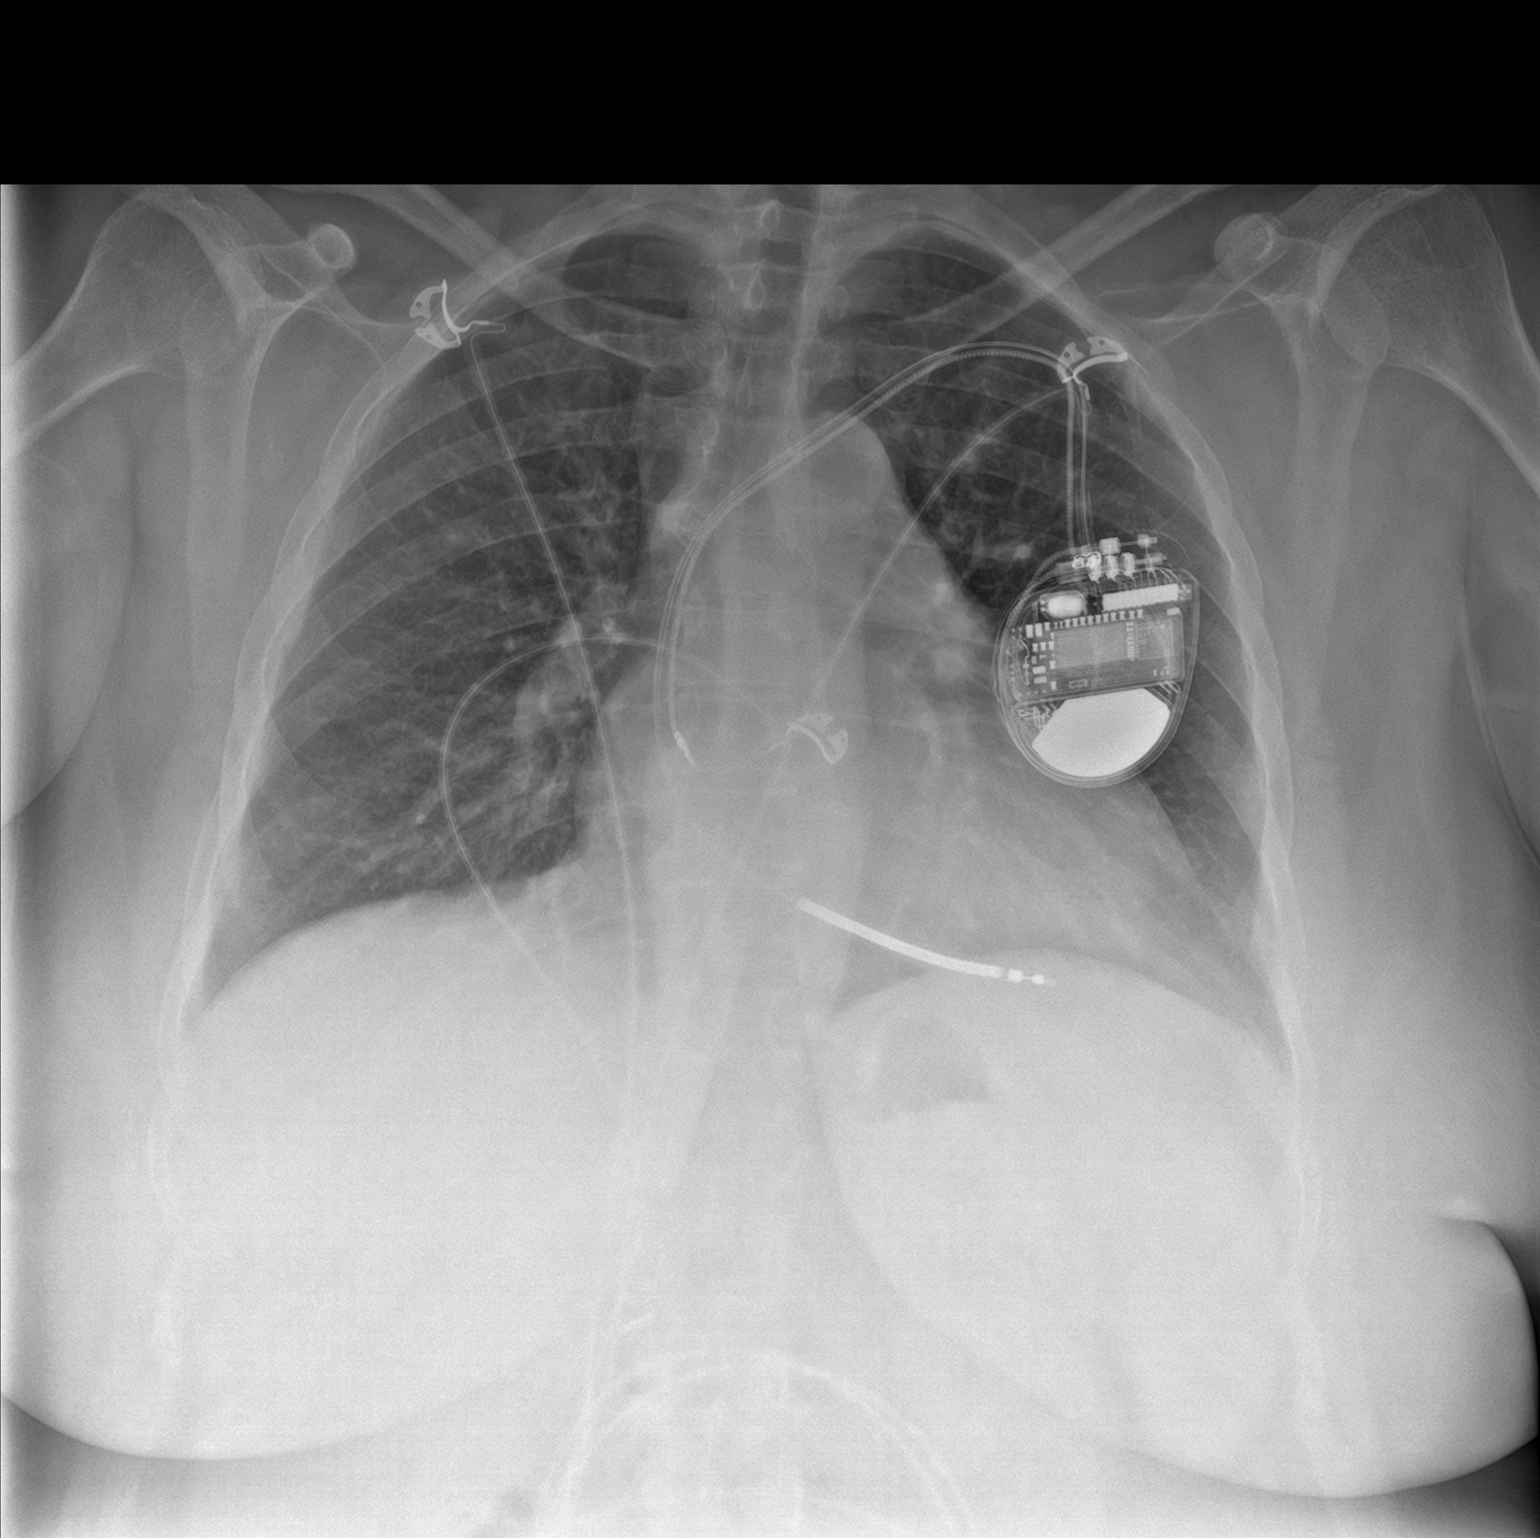

[chest lat]
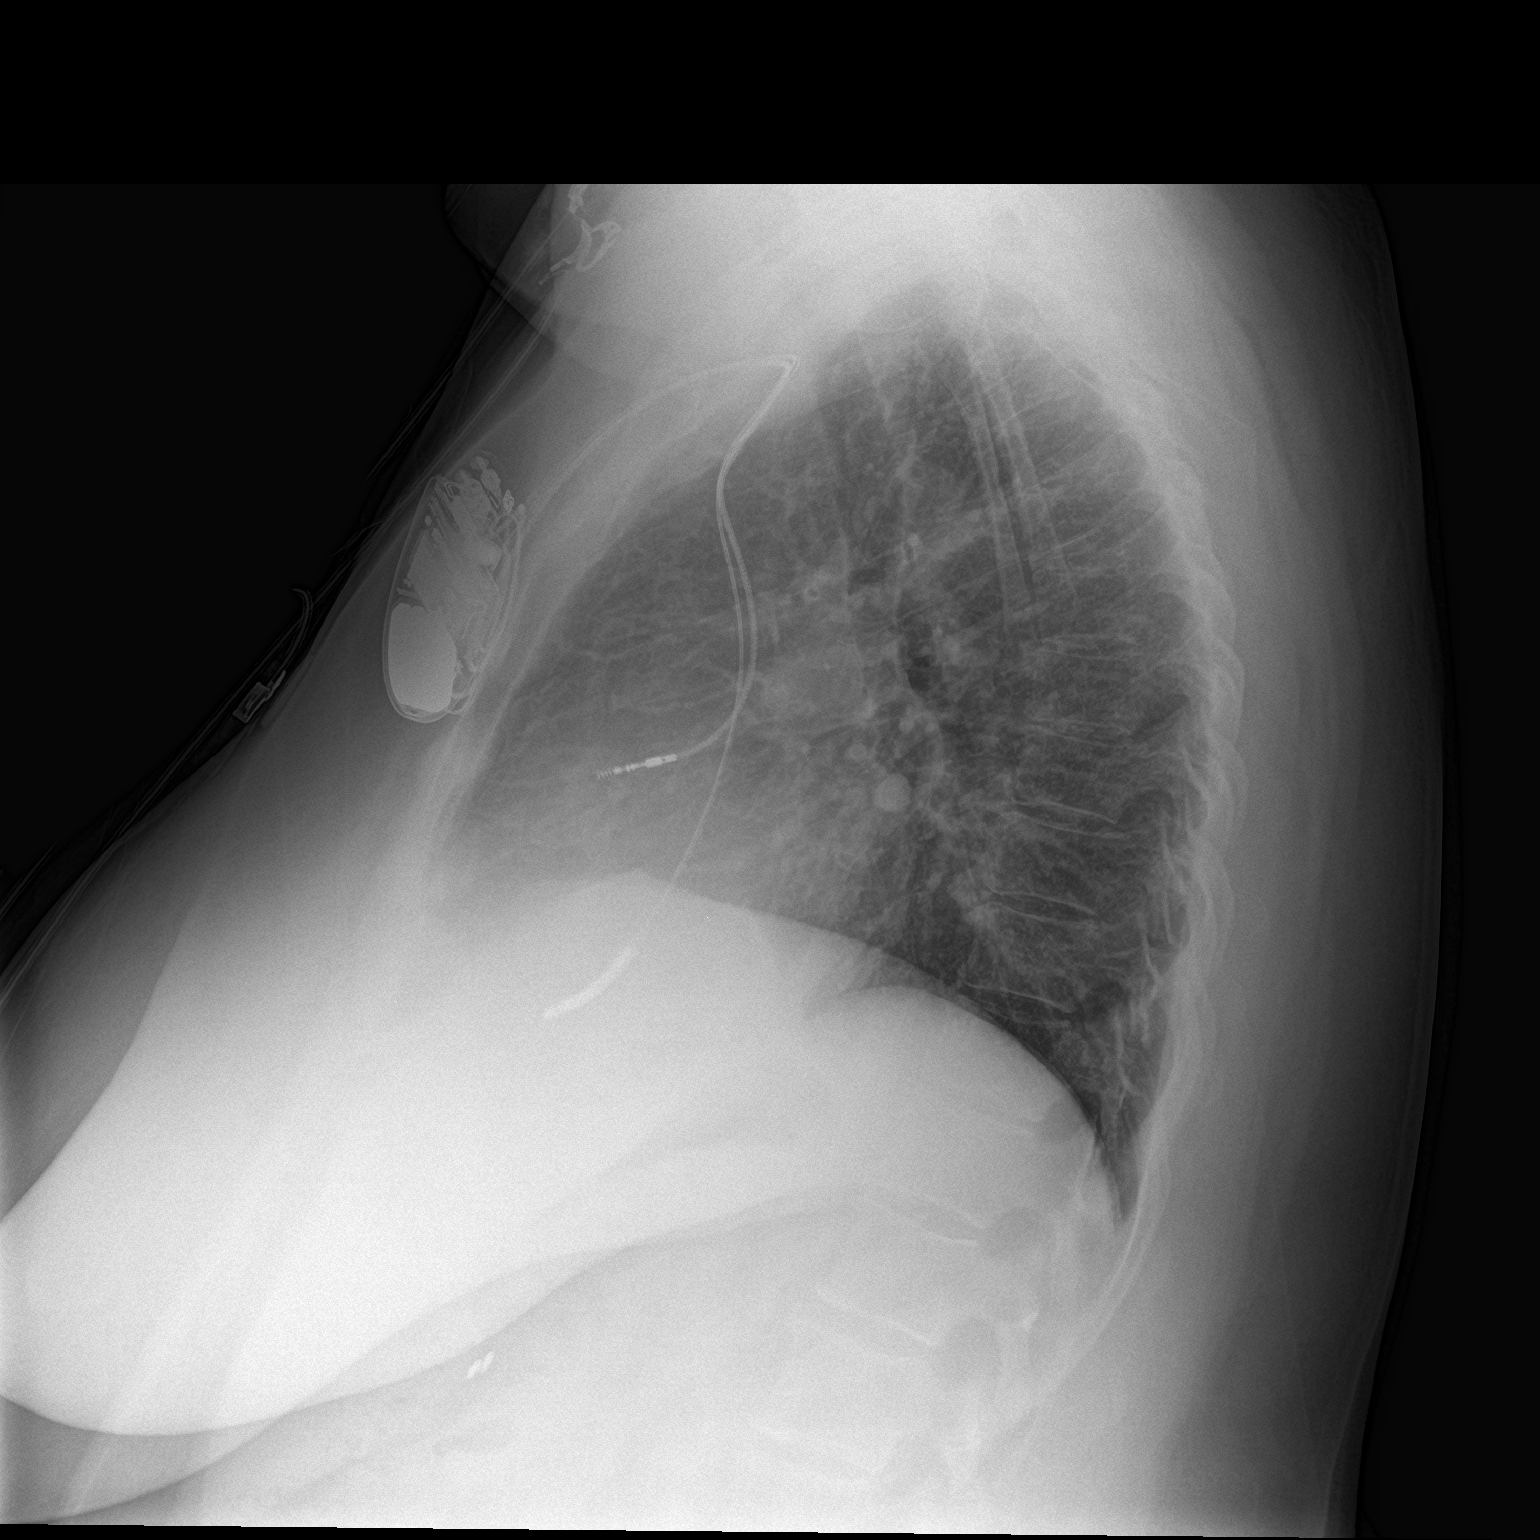

[2 of 2 positions shown; findings below may reference images not displayed]

FINDINGS: The lungs are well-aerated and clear. There is no evidence of focal
opacification, pleural effusion or pneumothorax.

The heart is borderline normal in size. A pacemaker/AICD is noted at
the left chest wall, with leads ending at the right atrium and right
ventricle. No acute osseous abnormalities are seen. Clips are noted
within the right upper quadrant, reflecting prior cholecystectomy.
IMPRESSION: No acute cardiopulmonary process seen.

## 2019-04-18 IMAGING — DX DG CHEST 1V PORT
1 series · 1 of 1 positions shown · non-contrast
Comparison: Radiographs January 29, 2018.

CLINICAL DATA: Productive cough.

EXAM:
PORTABLE CHEST 1 VIEW

[chest ap]
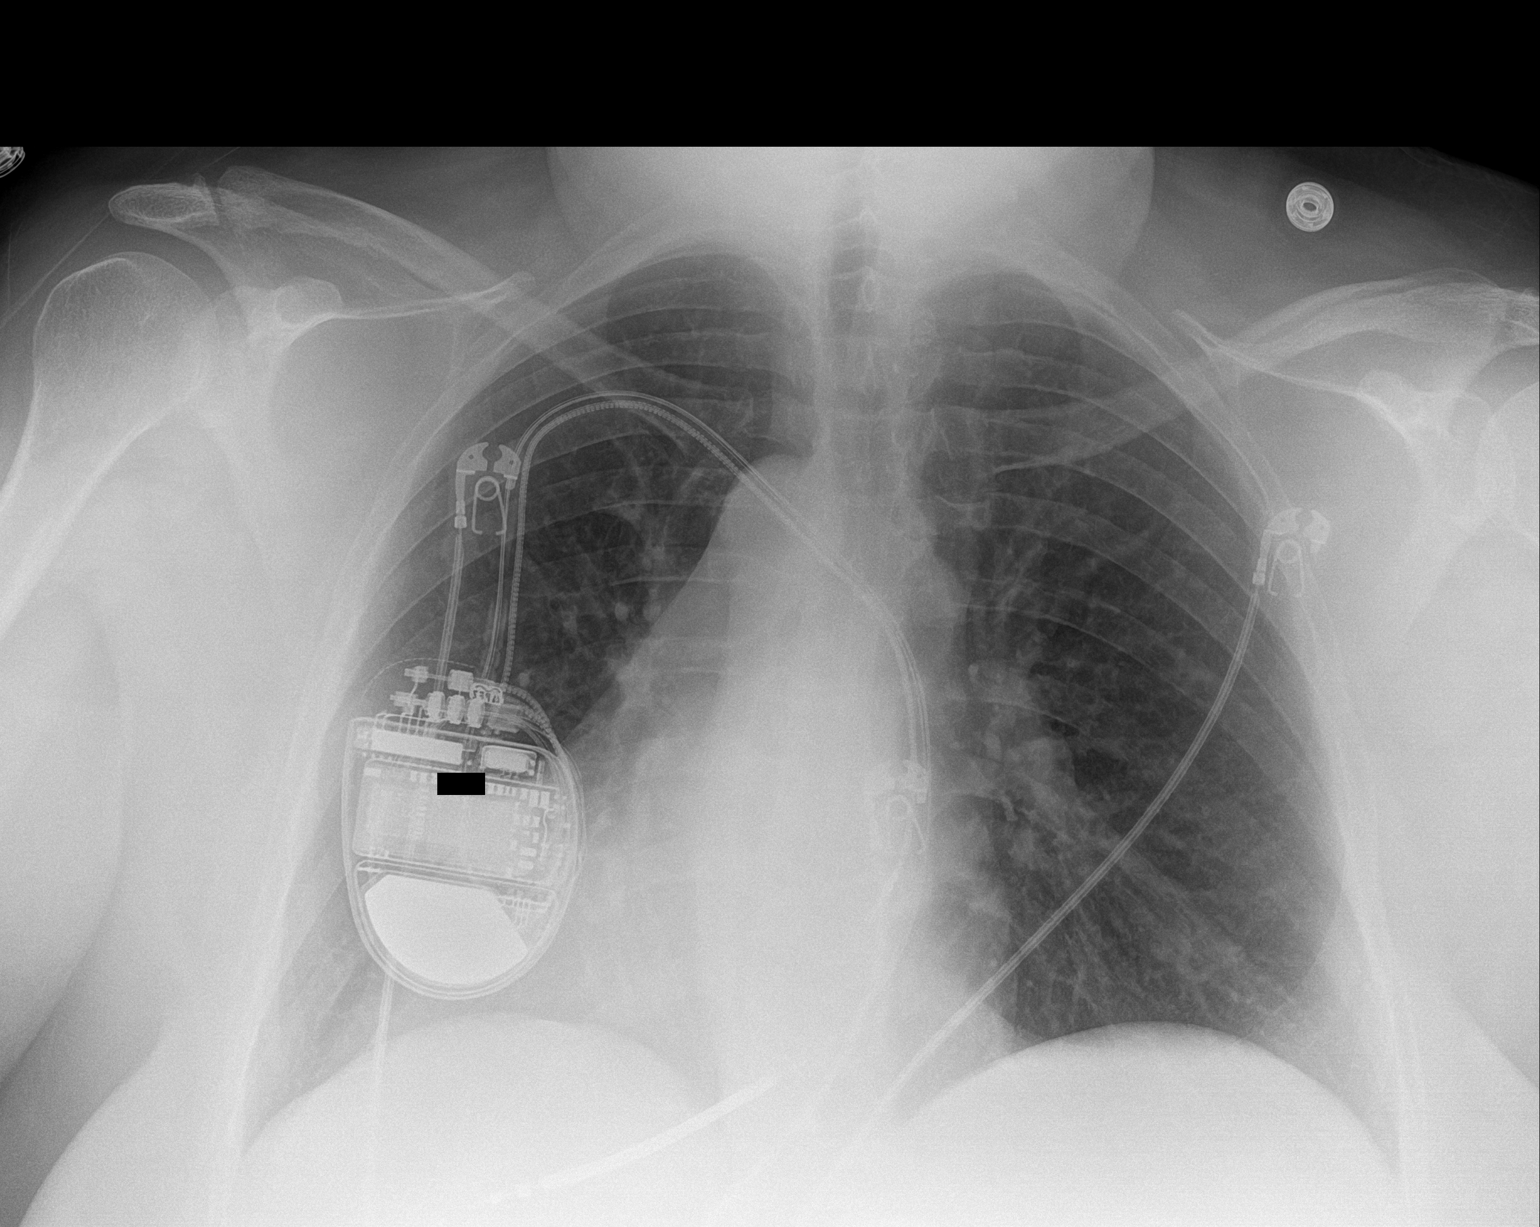

[1 of 1 positions shown; findings below may reference images not displayed]

FINDINGS: Stable cardiomediastinal silhouette. No pneumothorax or pleural
effusion is noted. Left-sided pacemaker is unchanged in position.
Both lungs are clear. The visualized skeletal structures are
unremarkable.
IMPRESSION: No acute cardiopulmonary abnormality seen.

## 2019-04-18 IMAGING — NM NM PULMONARY VENT & PERF
2 series · 12 of 12 positions shown · non-contrast
Comparison: None.

CLINICAL DATA: Short of breath for 3 weeks

EXAM:
NUCLEAR MEDICINE VENTILATION - PERFUSION LUNG SCAN
TECHNIQUE: Ventilation images were obtained in multiple projections using
inhaled aerosol Oc-OOm DTPA. Perfusion images were obtained in
multiple projections after intravenous injection of [id number].
RADIOPHARMACEUTICALS:  30.9 mCi of Oc-OOm DTPA aerosol inhalation
and 4.2 mCi 0cEEm-HZZ IV

[Series 1000: perfusion · 1.95mm/px · 3 acquisitions, 6 frames shown]
[im 1/3]
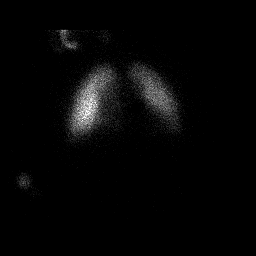
[im 1/3]
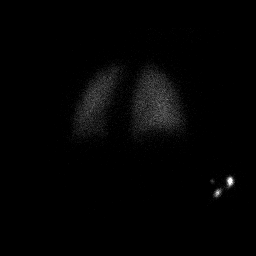
[im 2/3]
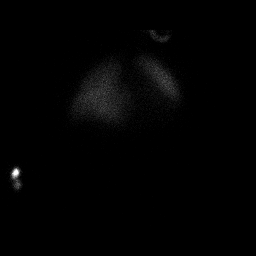
[im 2/3]
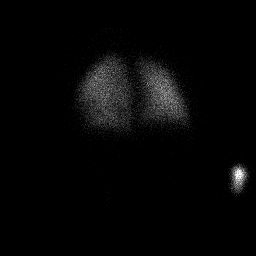
[im 3/3]
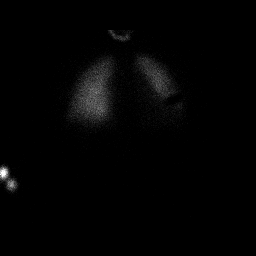
[im 3/3]
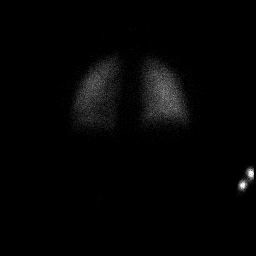

[Series 1000: ventilation · 3.90mm/px · 3 acquisitions, 6 frames shown]
[im 1/3]
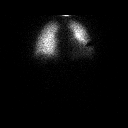
[im 1/3]
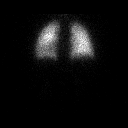
[im 2/3]
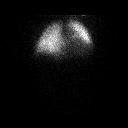
[im 2/3]
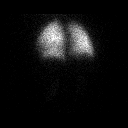
[im 3/3]
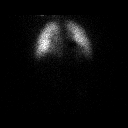
[im 3/3]
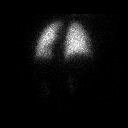

[12 of 12 positions shown; findings below may reference images not displayed]

FINDINGS: Lateral imaging could not be performed limiting the exam.

Ventilation: There is a nonsegmental defect in the lateral left mid
lung zone secondary to the pacemaker device. There are no true
segmental or subsegmental defects.

Perfusion: There is a nonsegmental perfusion defect in the left mid
lung zone laterally related to the pacemaker device. There are no
true segmental or subsegmental defects.
IMPRESSION: Low probability for pulmonary thromboembolism.

## 2019-04-24 DIAGNOSIS — B349 Viral infection, unspecified: Secondary | ICD-10-CM | POA: Insufficient documentation

## 2019-05-02 ENCOUNTER — Emergency Department
Admission: EM | Admit: 2019-05-02 | Discharge: 2019-05-02 | Disposition: A | Discharge status: Home / Self Care | Payer: Medicaid Other | Attending: Student in an Organized Health Care Education/Training Program | Admitting: Student in an Organized Health Care Education/Training Program

## 2019-05-02 ENCOUNTER — Other Ambulatory Visit: Payer: Self-pay

## 2019-05-02 ENCOUNTER — Encounter: Payer: Self-pay | Admitting: Emergency Medicine

## 2019-05-02 ENCOUNTER — Emergency Department: Payer: Medicaid Other

## 2019-05-02 DIAGNOSIS — Z79899 Other long term (current) drug therapy: Secondary | ICD-10-CM | POA: Diagnosis not present

## 2019-05-02 DIAGNOSIS — I5022 Chronic systolic (congestive) heart failure: Secondary | ICD-10-CM | POA: Diagnosis not present

## 2019-05-02 DIAGNOSIS — J449 Chronic obstructive pulmonary disease, unspecified: Secondary | ICD-10-CM | POA: Diagnosis not present

## 2019-05-02 DIAGNOSIS — R202 Paresthesia of skin: Secondary | ICD-10-CM | POA: Diagnosis present

## 2019-05-02 DIAGNOSIS — R002 Palpitations: Secondary | ICD-10-CM

## 2019-05-02 LAB — CBC WITH DIFFERENTIAL/PLATELET
Abs Immature Granulocytes: 0.05 10*3/uL (ref 0.00–0.07)
Basophils Absolute: 0.1 10*3/uL (ref 0.0–0.1)
Basophils Relative: 1 %
Eosinophils Absolute: 0.2 10*3/uL (ref 0.0–0.5)
Eosinophils Relative: 2 %
HCT: 39.9 % (ref 36.0–46.0)
Hemoglobin: 13.5 g/dL (ref 12.0–15.0)
Immature Granulocytes: 1 %
Lymphocytes Relative: 29 %
Lymphs Abs: 2.8 10*3/uL (ref 0.7–4.0)
MCH: 30.9 pg (ref 26.0–34.0)
MCHC: 33.8 g/dL (ref 30.0–36.0)
MCV: 91.3 fL (ref 80.0–100.0)
Monocytes Absolute: 0.7 10*3/uL (ref 0.1–1.0)
Monocytes Relative: 7 %
Neutro Abs: 5.8 10*3/uL (ref 1.7–7.7)
Neutrophils Relative %: 60 %
Platelets: 285 10*3/uL (ref 150–400)
RBC: 4.37 MIL/uL (ref 3.87–5.11)
RDW: 13.5 % (ref 11.5–15.5)
WBC: 9.7 10*3/uL (ref 4.0–10.5)
nRBC: 0 % (ref 0.0–0.2)

## 2019-05-02 LAB — URINALYSIS, ROUTINE W REFLEX MICROSCOPIC
Bilirubin Urine: NEGATIVE
Glucose, UA: NEGATIVE mg/dL
Hgb urine dipstick: NEGATIVE
Ketones, ur: NEGATIVE mg/dL
Leukocytes,Ua: NEGATIVE
Nitrite: NEGATIVE
Protein, ur: NEGATIVE mg/dL
Specific Gravity, Urine: 1.023 (ref 1.005–1.030)
pH: 5 (ref 5.0–8.0)

## 2019-05-02 LAB — COMPREHENSIVE METABOLIC PANEL
ALT: 20 U/L (ref 0–44)
AST: 24 U/L (ref 15–41)
Albumin: 3.8 g/dL (ref 3.5–5.0)
Alkaline Phosphatase: 71 U/L (ref 38–126)
Anion gap: 10 (ref 5–15)
BUN: 10 mg/dL (ref 6–20)
CO2: 29 mmol/L (ref 22–32)
Calcium: 8.5 mg/dL — ABNORMAL LOW (ref 8.9–10.3)
Chloride: 99 mmol/L (ref 98–111)
Creatinine, Ser: 0.87 mg/dL (ref 0.44–1.00)
GFR calc Af Amer: >60 mL/min (ref >60)
GFR calc non Af Amer: >60 mL/min (ref >60)
Glucose, Bld: 115 mg/dL — ABNORMAL HIGH (ref 70–99)
Potassium: 3.6 mmol/L (ref 3.5–5.1)
Sodium: 138 mmol/L (ref 135–145)
Total Bilirubin: 0.6 mg/dL (ref 0.3–1.2)
Total Protein: 7.6 g/dL (ref 6.5–8.1)

## 2019-05-02 LAB — URINE DRUG SCREEN, QUALITATIVE (ARMC ONLY)
Amphetamines, Ur Screen: NOT DETECTED
Barbiturates, Ur Screen: NOT DETECTED
Benzodiazepine, Ur Scrn: POSITIVE — AB
Cannabinoid 50 Ng, Ur ~~LOC~~: NOT DETECTED
Cocaine Metabolite,Ur ~~LOC~~: NOT DETECTED
MDMA (Ecstasy)Ur Screen: NOT DETECTED
Methadone Scn, Ur: NOT DETECTED
Opiate, Ur Screen: POSITIVE — AB
Phencyclidine (PCP) Ur S: NOT DETECTED
Tricyclic, Ur Screen: NOT DETECTED

## 2019-05-02 LAB — TROPONIN I (HIGH SENSITIVITY)
Troponin I (High Sensitivity): 3 ng/L (ref <18)
Troponin I (High Sensitivity): 3 ng/L (ref <18)

## 2019-05-02 LAB — POCT PREGNANCY, URINE: Preg Test, Ur: NEGATIVE

## 2019-05-02 MED ORDER — OXYCODONE HCL 5 MG PO TABS
5.0000 mg | ORAL_TABLET | Freq: Once | ORAL | Status: AC
  Administered 2019-05-02: 5 mg via ORAL
  Filled 2019-05-02: qty 1

## 2019-05-02 MED ORDER — ACETAMINOPHEN 500 MG PO TABS
1000.0000 mg | ORAL_TABLET | Freq: Once | ORAL | Status: AC
  Administered 2019-05-02: 1000 mg via ORAL
  Filled 2019-05-02: qty 2

## 2019-05-02 NOTE — ED Provider Notes (Signed)
[facility] Emergency Department Provider Note    First MD Initiated Contact with Patient 05/02/19 [phone]     (approximate)  I have reviewed the triage vital signs and the nursing notes.   HISTORY  Chief Complaint Irregular Heart Beat    HPI Brittany Nguyen is a 53 y.o. female   below listed past medical history with recent hospitalization presents the ER for generalized malaise having difficulty sleeping last night and a sensation of tingling on the left side of her face primarily just below her left eye without any radiation.  Denies any chest pain.  States that she spoke with her PCP who told her to come to the ER due to concern for A. fib however there is no documentation showing any A. fib.  States that she was also diagnosed with PE however on review of medical records from her previous admission had a VQ scan that did not show any evidence of PE.  She was not discharged on anticoagulation.  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  Denies any discomfort at this time.   Past Medical History:  Diagnosis Date  . Asthma 2013  . 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 CHF (congestive heart failure) ([place])   . COPD (chronic obstructive pulmonary disease) ([place])   . Diverticulitis 2010  . DVT (deep venous thrombosis) ([place])   . Endometriosis 1990  . Heart disease 2013  . Hx MRSA infection   . Iron deficiency   . Lump or mass in breast   . Restless leg   . Stomach cancer ([place])    Family History  Problem Relation Age of Onset  . Cancer Mother 30       ovarian  . CAD Mother   . Cancer Father 18       brain  . CAD Father   . Cancer Daughter 18       skin  . Cancer Maternal Aunt 49       breast  . Leukemia Paternal Grandfather    Past Surgical History:  Procedure Laterality Date  . ABDOMINAL HYSTERECTOMY     age 25  . BREAST BIOPSY Right  2014   benign  . CARDIAC DEFIBRILLATOR PLACEMENT    . CARDIAC DEFIBRILLATOR PLACEMENT    . CESAREAN SECTION    . CHOLECYSTECTOMY    . COLECTOMY    . COLONOSCOPY WITH PROPOFOL N/A 02/22/2018   Procedure: COLONOSCOPY WITH PROPOFOL;  Surgeon: Toledo, Benay Pike, MD;  Location: [facility] ENDOSCOPY;  Service: Gastroenterology;  Laterality: N/A;  . ESOPHAGOGASTRODUODENOSCOPY (EGD) WITH PROPOFOL N/A 02/22/2018   Procedure: ESOPHAGOGASTRODUODENOSCOPY (EGD) WITH PROPOFOL;  Surgeon: Toledo, Benay Pike, MD;  Location: [facility] ENDOSCOPY;  Service: Gastroenterology;  Laterality: N/A;  . NASAL SINUS SURGERY  2012  . OOPHORECTOMY     Patient Active Problem List   Diagnosis Date Noted  . Iron deficiency anemia 03/27/2018  . Anemia 03/26/2018  . Abdominal pain 02/20/2018  . Unstable angina ([place])   . Encounter for anticoagulation discussion and counseling   . C. difficile colitis 01/14/2018  . GIB (gastrointestinal bleeding) 01/08/2018  . SOB (shortness of breath) 11/01/2017  . Influenza A 10/29/2017  . Acute respiratory failure ([place]) 09/24/2017  . Sepsis ([place]) 07/03/2017  . COPD (chronic obstructive pulmonary disease) ([place]) 04/26/2017  . Community acquired pneumonia 04/25/2017  . Chronic systolic heart failure (  [place]) 02/03/2017  . Hypotension 02/03/2017  . Chest pain 12/09/2016  . RLS (restless legs syndrome) 12/09/2016  . Cough, persistent 11/22/2015  . Hypokalemia 11/22/2015  . Leukocytosis 11/22/2015  . Dyspnea 11/21/2015  . Respiratory distress 09/19/2015  . Breast pain 01/11/2013  . Hx MRSA infection   . Lump or mass in breast   . GOITER, MULTINODULAR 01/20/2009  . HYPOGLYCEMIA, UNSPECIFIED 01/20/2009  . GERD 01/20/2009  . DIVERTICULITIS OF COLON 01/20/2009      Prior to Admission medications   Medication Sig Start Date End Date Taking? Authorizing Provider  beclomethasone (QVAR) 80 MCG/ACT inhaler Inhale 1 puff into the lungs 2 (two) times daily. 06/12/17  Yes [provider]   busPIRone (BUSPAR) 15 MG tablet Take 15 mg by mouth 2 (two) times daily. 05/08/18  Yes [provider]  famotidine (PEPCID) 20 MG tablet Take 1 tablet (20 mg total) by mouth 2 (two) times daily. 03/15/19  Yes Carrie Mew, MD  fentaNYL (DURAGESIC) 50 MCG/HR Place 1 patch onto the skin every 3 (three) days. 10/08/18  Yes Epifanio Lesches, MD  furosemide (LASIX) 40 MG tablet Take 80 mg by mouth 2 (two) times daily.  07/09/18  Yes [provider]  hydrOXYzine (ATARAX/VISTARIL) 25 MG tablet Take 25 mg by mouth every 6 (six) hours as needed for itching. 04/13/19  Yes [provider]  levothyroxine (SYNTHROID, LEVOTHROID) 150 MCG tablet Take 150 mcg by mouth daily before breakfast.   Yes [provider]  losartan (COZAAR) 25 MG tablet Take 25 mg by mouth daily.    Yes [provider]  metoprolol succinate (TOPROL-XL) 50 MG 24 hr tablet Take 50 mg by mouth 2 (two) times daily. 01/25/19  Yes [provider]  mirtazapine (REMERON) 15 MG tablet Take 15 mg by mouth at bedtime. 04/26/19 05/26/24 Yes [provider]  omeprazole (PRILOSEC) 40 MG capsule Take 40 mg by mouth 2 (two) times daily. 10/19/18  Yes [provider]  oxyCODONE (OXY IR/ROXICODONE) 5 MG immediate release tablet Take 5-10 mg by mouth every 4 (four) hours as needed for pain. 04/11/19  Yes [provider]  Pancrelipase, Lip-Prot-Amyl, (CREON) 24000-76000 units CPEP Take 3-4 capsules by mouth 3 (three) times daily with meals. 04/01/19  Yes [provider]  potassium chloride SA (KLOR-CON M20) 20 MEQ tablet Take 1 tablet (20 mEq total) by mouth 2 (two) times daily for 7 days. Patient taking differently: Take 40 mEq by mouth daily as needed. 1 tablet twice a day 06/17/18 05/02/19 Yes Hinda Kehr, MD  promethazine (PHENERGAN) 25 MG tablet Take 25 mg by mouth every 6 (six) hours as needed. 03/20/19  Yes [provider]  rOPINIRole (REQUIP) 4 MG tablet Take 4  mg by mouth at bedtime. 01/30/19  Yes [provider]  spironolactone (ALDACTONE) 50 MG tablet Take 50 mg by mouth daily. 11/26/18  Yes [provider]  vitamin B-12 (CYANOCOBALAMIN) 1000 MCG tablet Take 1,000 mcg by mouth daily.   Yes [provider]  albuterol (ACCUNEB) 1.25 MG/3ML nebulizer solution Take 3 mLs by nebulization every 6 (six) hours as needed for wheezing. 11/09/17   [provider]  albuterol (VENTOLIN HFA) 108 (90 Base) MCG/ACT inhaler Inhale 2 puffs into the lungs every 6 (six) hours as needed for shortness of breath. 08/28/16   [provider]  beclomethasone (QVAR) 80 MCG/ACT inhaler Inhale 2 puffs into the lungs 2 (two) times daily. 06/05/17 10/03/18  Laverle Hobby, MD  doxycycline (VIBRAMYCIN) 100  MG capsule Take 1 capsule (100 mg total) by mouth 2 (two) times daily. Patient not taking: Reported on 05/02/2019 02/27/19   Johnn Hai, PA-C  nitroGLYCERIN (NITROSTAT) 0.4 MG SL tablet Place 1 tablet (0.4 mg total) under the tongue every 5 (five) minutes as needed for chest pain. 01/24/17   Henreitta Leber, MD  oxyCODONE-acetaminophen (PERCOCET) 5-325 MG tablet Take 1 tablet by mouth every 6 (six) hours as needed for severe pain. Patient not taking: Reported on 05/02/2019 02/27/19   Johnn Hai, PA-C  promethazine (PHENERGAN) 25 MG suppository Place 1 suppository (25 mg total) rectally every 6 (six) hours as needed for nausea. Patient not taking: Reported on 05/02/2019 03/15/19   Carrie Mew, MD  sucralfate (CARAFATE) 1 g tablet Take 1 tablet (1 g total) by mouth 4 (four) times daily. 03/15/19   Carrie Mew, MD    Allergies Contrast media [iodinated diagnostic agents], Lidocaine, Metrizamide, Penicillins, Isosorbide nitrate, Latex, Ondansetron, Zofran [ondansetron hcl], Hydromorphone, Povidone-iodine, and Pulmicort [budesonide]    Social History Social History   Tobacco Use  . Smoking status: Never Smoker  .  Smokeless tobacco: Never Used  Substance Use Topics  . Alcohol use: No  . Drug use: No    Review of Systems Patient denies headaches, rhinorrhea, blurry vision, numbness, shortness of breath, chest pain, edema, cough, abdominal pain, nausea, vomiting, diarrhea, dysuria, fevers, rashes or hallucinations unless otherwise stated above in HPI. ____________________________________________   PHYSICAL EXAM:  VITAL SIGNS: Vitals:   05/02/19 0846 05/02/19 0900  BP: 111/78 107/63  Pulse: 90 86  Resp: 16 11  Temp: 98.4 F (36.9 C)   SpO2: 100% 99%    Constitutional: Alert and oriented.  Eyes: Conjunctivae are normal.  Head: Atraumatic. Nose: No congestion/rhinnorhea. Mouth/Throat: Mucous membranes are moist.   Neck: No stridor. Painless ROM.  Cardiovascular: Normal rate, regular rhythm. Grossly normal heart sounds.  Good peripheral circulation. Respiratory: Normal respiratory effort.  No retractions. Lungs CTAB. Gastrointestinal: Soft and nontender. No distention. No abdominal bruits. No CVA tenderness. Genitourinary:  Musculoskeletal: No lower extremity tenderness nor edema.  No joint effusions. Neurologic:  CN- intact.  No facial droop, Normal FNF.  Normal heel to shin.  Sensation intact bilaterally. Normal speech and language. No gross focal neurologic deficits are appreciated. No gait instability. Skin:  Skin is warm, dry and intact. No rash noted. Psychiatric: Mood and affect are normal. Speech and behavior are normal.  ____________________________________________   LABS (all labs ordered are listed, but only abnormal results are displayed)  Results for orders placed or performed during the hospital encounter of 05/02/19 (from the past 24 hour(s))  Urine Drug Screen, Qualitative     Status: Abnormal   Collection Time: 05/02/19  9:11 AM  Result Value Ref Range   Tricyclic, Ur Screen NONE DETECTED NONE DETECTED   Amphetamines, Ur Screen NONE DETECTED NONE DETECTED   MDMA  (Ecstasy)Ur Screen NONE DETECTED NONE DETECTED   Cocaine Metabolite,Ur [place] NONE DETECTED NONE DETECTED   Opiate, Ur Screen POSITIVE (A) NONE DETECTED   Phencyclidine (PCP) Ur S NONE DETECTED NONE DETECTED   Cannabinoid 50 Ng, Ur [place] NONE DETECTED NONE DETECTED   Barbiturates, Ur Screen NONE DETECTED NONE DETECTED   Benzodiazepine, Ur Scrn POSITIVE (A) NONE DETECTED   Methadone Scn, Ur NONE DETECTED NONE DETECTED  Urinalysis, Routine w reflex microscopic     Status: Abnormal   Collection Time: 05/02/19  9:11 AM  Result Value Ref Range   Color, Urine YELLOW (  A) YELLOW   APPearance HAZY (A) CLEAR   Specific Gravity, Urine 1.023 1.005 - 1.030   pH 5.0 5.0 - 8.0   Glucose, UA NEGATIVE NEGATIVE mg/dL   Hgb urine dipstick NEGATIVE NEGATIVE   Bilirubin Urine NEGATIVE NEGATIVE   Ketones, ur NEGATIVE NEGATIVE mg/dL   Protein, ur NEGATIVE NEGATIVE mg/dL   Nitrite NEGATIVE NEGATIVE   Leukocytes,Ua NEGATIVE NEGATIVE  CBC WITH DIFFERENTIAL     Status: None   Collection Time: 05/02/19  9:11 AM  Result Value Ref Range   WBC 9.7 4.0 - 10.5 K/uL   RBC 4.37 3.87 - 5.11 MIL/uL   Hemoglobin 13.5 12.0 - 15.0 g/dL   HCT 39.9 36.0 - 46.0 %   MCV 91.3 80.0 - 100.0 fL   MCH 30.9 26.0 - 34.0 pg   MCHC 33.8 30.0 - 36.0 g/dL   RDW 13.5 11.5 - 15.5 %   Platelets 285 150 - 400 K/uL   nRBC 0.0 0.0 - 0.2 %   Neutrophils Relative % 60 %   Neutro Abs 5.8 1.7 - 7.7 K/uL   Lymphocytes Relative 29 %   Lymphs Abs 2.8 0.7 - 4.0 K/uL   Monocytes Relative 7 %   Monocytes Absolute 0.7 0.1 - 1.0 K/uL   Eosinophils Relative 2 %   Eosinophils Absolute 0.2 0.0 - 0.5 K/uL   Basophils Relative 1 %   Basophils Absolute 0.1 0.0 - 0.1 K/uL   Immature Granulocytes 1 %   Abs Immature Granulocytes 0.05 0.00 - 0.07 K/uL  Comprehensive metabolic panel     Status: Abnormal   Collection Time: 05/02/19  9:11 AM  Result Value Ref Range   Sodium 138 135 - 145 mmol/L   Potassium 3.6 3.5 - 5.1 mmol/L   Chloride 99 98 - 111 mmol/L    CO2 29 22 - 32 mmol/L   Glucose, Bld 115 (H) 70 - 99 mg/dL   BUN 10 6 - 20 mg/dL   Creatinine, Ser 0.87 0.44 - 1.00 mg/dL   Calcium 8.5 (L) 8.9 - 10.3 mg/dL   Total Protein 7.6 6.5 - 8.1 g/dL   Albumin 3.8 3.5 - 5.0 g/dL   AST 24 15 - 41 U/L   ALT 20 0 - 44 U/L   Alkaline Phosphatase 71 38 - 126 U/L   Total Bilirubin 0.6 0.3 - 1.2 mg/dL   GFR calc non Af Amer >60 >60 mL/min   GFR calc Af Amer >60 >60 mL/min   Anion gap 10 5 - 15  Troponin I (High Sensitivity)     Status: None   Collection Time: 05/02/19  9:11 AM  Result Value Ref Range   Troponin I (High Sensitivity) 3 <18 ng/L  Pregnancy, urine POC     Status: None   Collection Time: 05/02/19  9:23 AM  Result Value Ref Range   Preg Test, Ur NEGATIVE NEGATIVE  Troponin I (High Sensitivity)     Status: None   Collection Time: 05/02/19 11:35 AM  Result Value Ref Range   Troponin I (High Sensitivity) 3 <18 ng/L   ____________________________________________  EKG My review and personal interpretation at Time: 8:55   Indication: palpitations  Rate: 85  Rhythm: sinus Axis: left Other: poor r wave progression, no stemi ____________________________________________  RADIOLOGY  I personally reviewed all radiographic images ordered to evaluate for the above acute complaints and reviewed radiology reports and findings.  These findings were personally discussed with the patient.  Please see medical record for radiology  report.  ____________________________________________   PROCEDURES  Procedure(s) performed:  Procedures    Critical Care performed: no ____________________________________________   INITIAL IMPRESSION / ASSESSMENT AND PLAN / ED COURSE  Pertinent labs & imaging results that were available during my care of the patient were reviewed by me and considered in my medical decision making (see chart for details).   DDX: .  Paresthesia, CVA, mass, TIA, ACS, dysrhythmia, pneumonia, Bell's  Doloris Justis is  a 53 y.o. who presents to the ED with symptoms as described above.  Patient nontoxic-appearing but has extensive past medical history.  Her neuro exam is completely reassuring without any objective neuro deficits.  Based on this I will not call code stroke.  Her presentation is further complicated by her history of intolerance of anticoagulation.  Develops any evidence of A. fib on her monitor or her EKG but states that she was told by her cardiac device company that she was having intermittent A. fib and to follow-up with her physician.  Will observe on the monitor.  Unfortunately given her history of GI bleeding it seems that would be more likely to cause harm initiating anticoagulation or antiplatelet therapy in this setting without evidence of true A. fib.  In addition some of her provided history is a bit misleading and contradicts what has been documented in her medical record.  Blood work and imaging will be ordered for the above differential.  Clinical Course as of May 01 1310  Thu May 02, 2019  1308 Patient reassessed.  She feels well at this time.  Again does not have any objective neuro deficits.  No chest pain.  Review of her telemetry does not show any evidence of A. fib or dysrhythmia.  She has 2- enzymes.  At this point do believe she stable and appropriate for outpatient follow-up.   [PR]    Clinical Course User Index [PR] Merlyn Lot, MD    The patient was evaluated in Emergency Department today for the symptoms described in the history of present illness. He/she was evaluated in the context of the global COVID-19 pandemic, which necessitated consideration that the patient might be at risk for infection with the SARS-CoV-2 virus that causes COVID-19. Institutional protocols and algorithms that pertain to the evaluation of patients at risk for COVID-19 are in a state of rapid change based on information released by regulatory bodies including the CDC and federal and state  organizations. These policies and algorithms were followed during the patient's care in the ED.  As part of my medical decision making, I reviewed the following data within the [place] notes reviewed and incorporated, Labs reviewed, notes from prior ED visits and  Controlled Substance Database   ____________________________________________   FINAL CLINICAL IMPRESSION(S) / ED DIAGNOSES  Final diagnoses:  Palpitations  Paresthesia      NEW MEDICATIONS STARTED DURING THIS VISIT:  New Prescriptions   No medications on file     Note:  This document was prepared using Dragon voice recognition software and may include unintentional dictation errors.    Merlyn Lot, MD 05/02/19 1311

## 2019-05-02 NOTE — ED Notes (Signed)
Pt ambulated to the bathroom without assistance. 

## 2019-05-02 NOTE — ED Triage Notes (Signed)
Says her doctor called and says she has a fib they see on a monitor.  She also says she has left side facial numbness for about 2 hours.

## 2019-05-10 ENCOUNTER — Other Ambulatory Visit: Payer: Self-pay

## 2019-05-10 ENCOUNTER — Telehealth: Payer: Self-pay | Admitting: Emergency Medicine

## 2019-05-10 ENCOUNTER — Emergency Department
Admission: EM | Admit: 2019-05-10 | Discharge: 2019-05-10 | Disposition: A | Discharge status: Home / Self Care | Payer: Medicaid Other | Attending: Emergency Medicine | Admitting: Emergency Medicine

## 2019-05-10 DIAGNOSIS — R0602 Shortness of breath: Secondary | ICD-10-CM | POA: Insufficient documentation

## 2019-05-10 DIAGNOSIS — Z5321 Procedure and treatment not carried out due to patient leaving prior to being seen by health care provider: Secondary | ICD-10-CM | POA: Diagnosis not present

## 2019-05-10 LAB — BASIC METABOLIC PANEL
Anion gap: 8 (ref 5–15)
BUN: 10 mg/dL (ref 6–20)
CO2: 29 mmol/L (ref 22–32)
Calcium: 8.3 mg/dL — ABNORMAL LOW (ref 8.9–10.3)
Chloride: 102 mmol/L (ref 98–111)
Creatinine, Ser: 0.85 mg/dL (ref 0.44–1.00)
GFR calc Af Amer: >60 mL/min (ref >60)
GFR calc non Af Amer: >60 mL/min (ref >60)
Glucose, Bld: 139 mg/dL — ABNORMAL HIGH (ref 70–99)
Potassium: 3.1 mmol/L — ABNORMAL LOW (ref 3.5–5.1)
Sodium: 139 mmol/L (ref 135–145)

## 2019-05-10 LAB — TROPONIN I (HIGH SENSITIVITY): Troponin I (High Sensitivity): 4 ng/L (ref <18)

## 2019-05-10 LAB — CBC
HCT: 36 % (ref 36.0–46.0)
Hemoglobin: 12.5 g/dL (ref 12.0–15.0)
MCH: 30.8 pg (ref 26.0–34.0)
MCHC: 34.7 g/dL (ref 30.0–36.0)
MCV: 88.7 fL (ref 80.0–100.0)
Platelets: 292 10*3/uL (ref 150–400)
RBC: 4.06 MIL/uL (ref 3.87–5.11)
RDW: 13.2 % (ref 11.5–15.5)
WBC: 11.4 10*3/uL — ABNORMAL HIGH (ref 4.0–10.5)
nRBC: 0 % (ref 0.0–0.2)

## 2019-05-10 LAB — BRAIN NATRIURETIC PEPTIDE: B Natriuretic Peptide: 32 pg/mL (ref 0.0–100.0)

## 2019-05-10 MED ORDER — SODIUM CHLORIDE 0.9% FLUSH: 3.0000 mL | Freq: Once | INTRAVENOUS | Status: DC

## 2019-05-10 MED ORDER — ALBUTEROL SULFATE (2.5 MG/3ML) 0.083% IN NEBU: 5.0000 mg | INHALATION_SOLUTION | Freq: Once | RESPIRATORY_TRACT | Status: DC

## 2019-05-10 MED ORDER — ALBUTEROL SULFATE (2.5 MG/3ML) 0.083% IN NEBU
INHALATION_SOLUTION | RESPIRATORY_TRACT | Status: AC
  Filled 2019-05-10: qty 6

## 2019-05-10 NOTE — ED Notes (Signed)
Pt noted leaving ED lobby, getting in car and leaving parking lot

## 2019-05-10 NOTE — Telephone Encounter (Signed)
Called patient due to lwot to inquire about condition and follow up plans. Left message.   

## 2019-05-10 NOTE — ED Triage Notes (Addendum)
Pt arrives to ED via POV from home with c/o [facility] and swelling in her hands, legs and feet since yesterday. Pt reports h/x of CHF; states takes diuretics as prescribed. Pt denies fever; no N/V/D. Pt reports non-productive cough. Pt is A&O, in NAD; RR even, regular, and unlabored. Pt with audible expiratory wheezing.

## 2019-05-21 DIAGNOSIS — F321 Major depressive disorder, single episode, moderate: Secondary | ICD-10-CM | POA: Insufficient documentation

## 2019-06-20 IMAGING — CR DG CHEST 2V
2 series · 2 of 2 positions shown · non-contrast
Comparison: Portable chest 01/31/2018 and earlier.

CLINICAL DATA: 52-year-old female with possible acute
hypersensitive reaction to IV Feraheme. Chest pain, back pain,
shortness of breath.

EXAM:
CHEST - 2 VIEW

[chest pa]
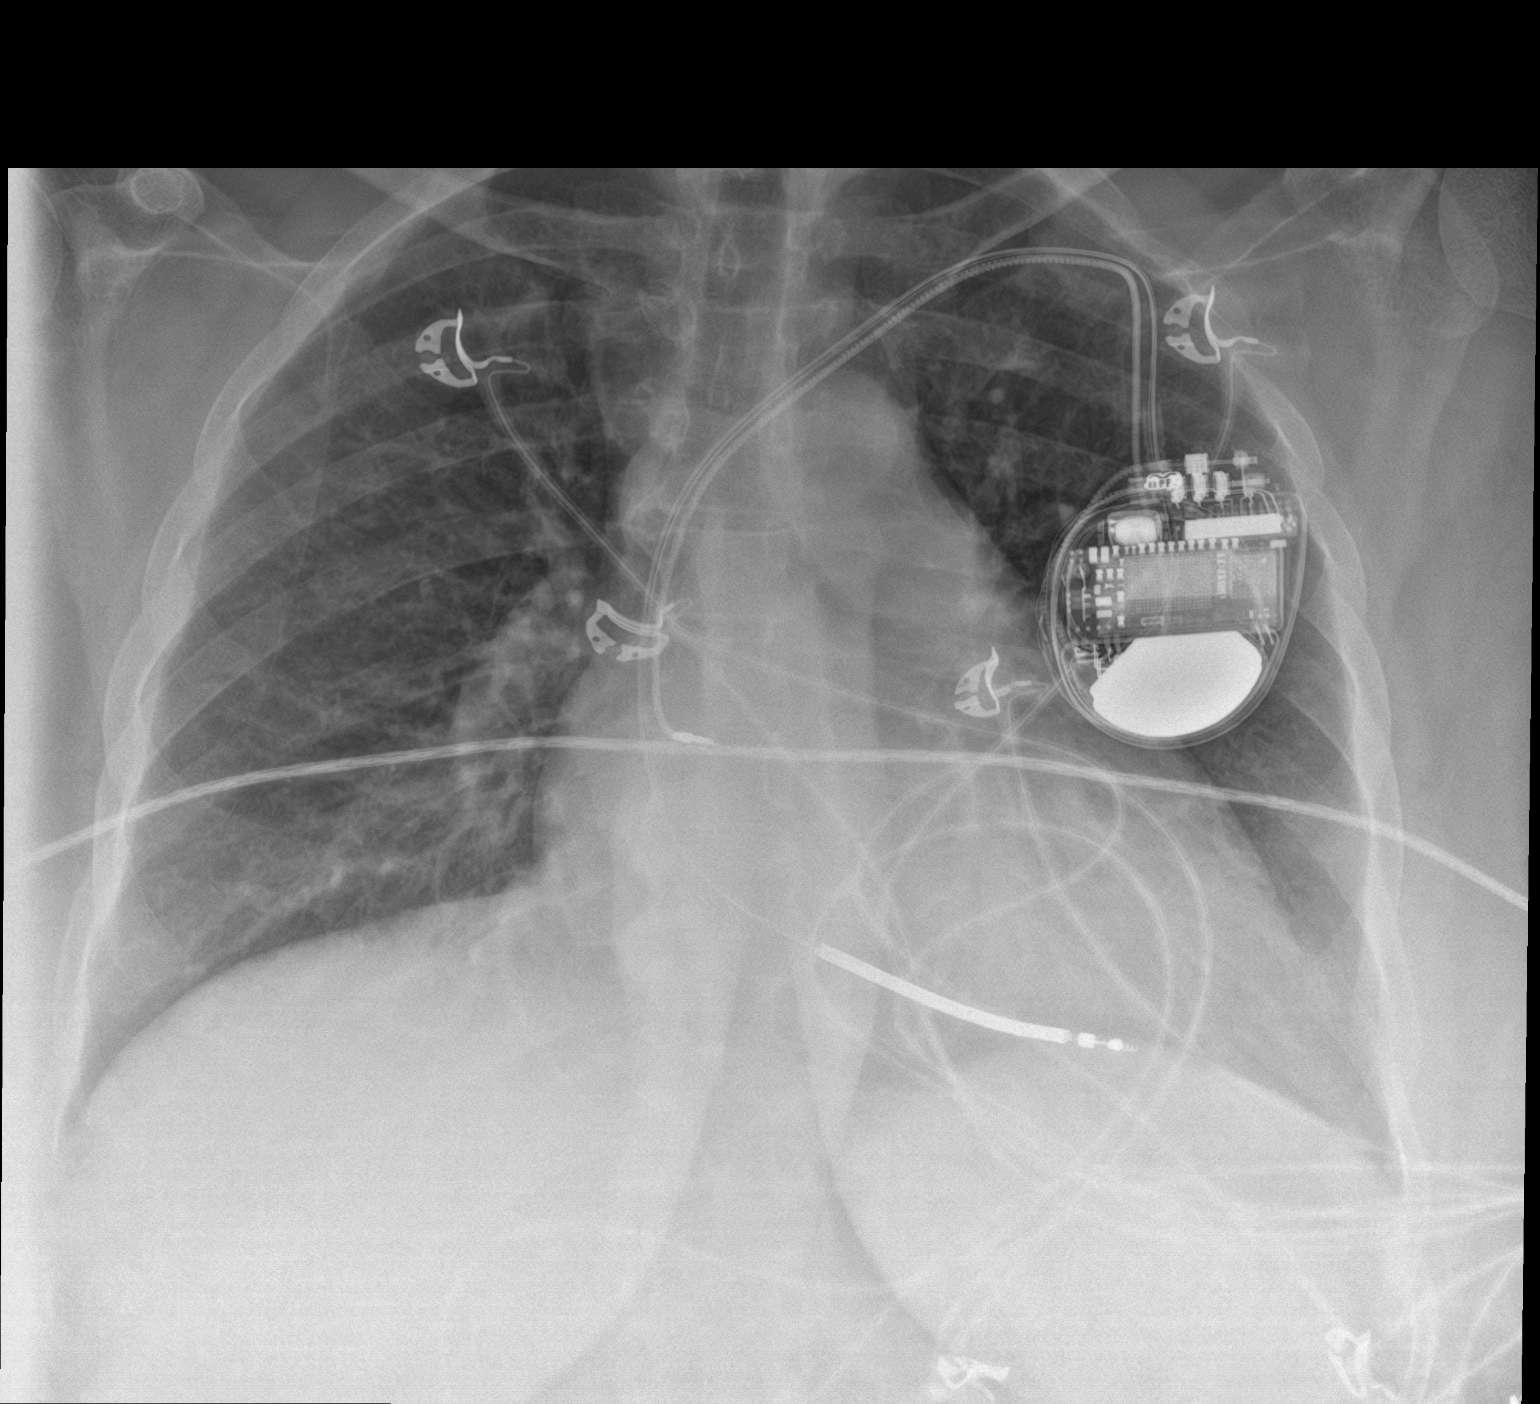

[chest lat]
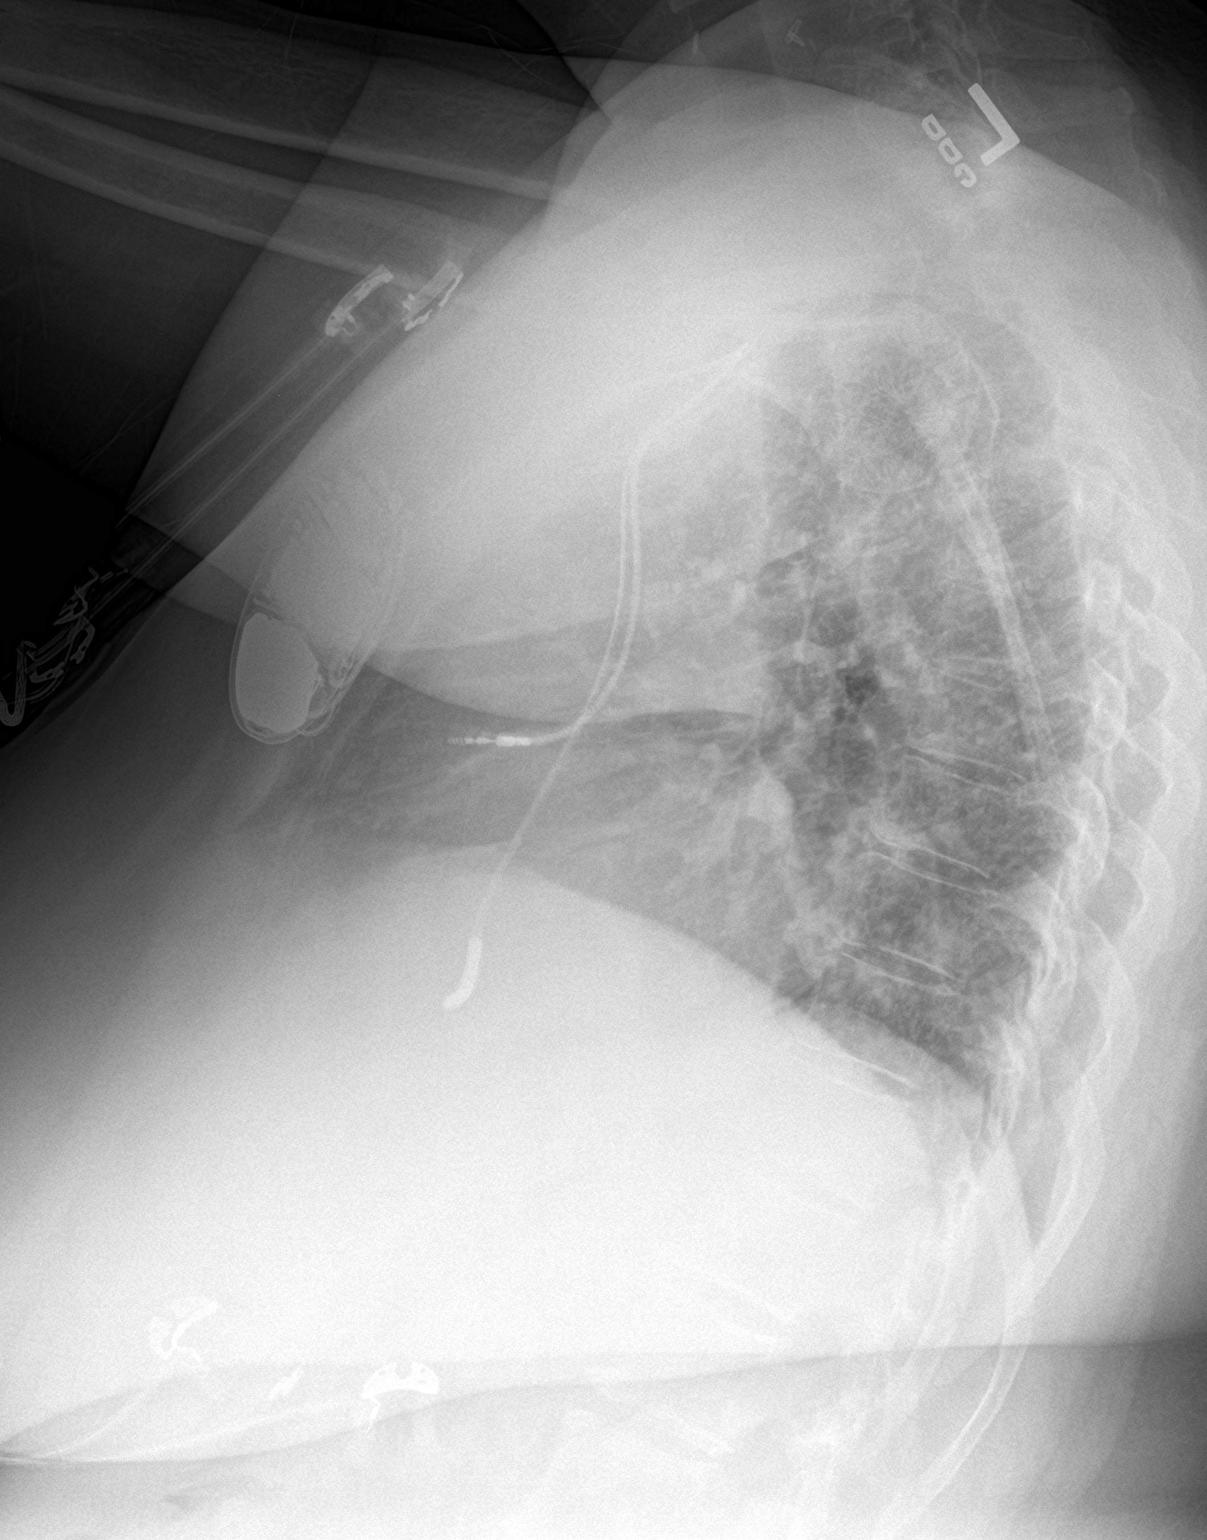

[2 of 2 positions shown; findings below may reference images not displayed]

FINDINGS: Stable somewhat low lung volumes. Stable cardiomegaly and
mediastinal contours. Stable left chest cardiac AICD. Visualized
tracheal air column is within normal limits. Stable lung parenchyma
with no pneumothorax, pleural effusion or confluent pulmonary
opacity. No overt pulmonary edema. No acute osseous abnormality
identified. Negative visible bowel gas pattern.
IMPRESSION: No acute cardiopulmonary abnormality.

## 2019-07-09 ENCOUNTER — Encounter: Payer: Self-pay | Admitting: Emergency Medicine

## 2019-07-09 ENCOUNTER — Other Ambulatory Visit: Payer: Self-pay

## 2019-07-09 ENCOUNTER — Emergency Department: Payer: Medicare Other

## 2019-07-09 ENCOUNTER — Emergency Department
Admission: EM | Admit: 2019-07-09 | Discharge: 2019-07-09 | Disposition: A | Discharge status: Home / Self Care | Payer: Medicare Other | Attending: Emergency Medicine | Admitting: Emergency Medicine

## 2019-07-09 DIAGNOSIS — J449 Chronic obstructive pulmonary disease, unspecified: Secondary | ICD-10-CM | POA: Diagnosis not present

## 2019-07-09 DIAGNOSIS — R079 Chest pain, unspecified: Secondary | ICD-10-CM | POA: Diagnosis present

## 2019-07-09 DIAGNOSIS — E876 Hypokalemia: Secondary | ICD-10-CM

## 2019-07-09 DIAGNOSIS — Z79899 Other long term (current) drug therapy: Secondary | ICD-10-CM | POA: Insufficient documentation

## 2019-07-09 DIAGNOSIS — Z91040 Latex allergy status: Secondary | ICD-10-CM | POA: Insufficient documentation

## 2019-07-09 DIAGNOSIS — R110 Nausea: Secondary | ICD-10-CM | POA: Diagnosis not present

## 2019-07-09 DIAGNOSIS — E860 Dehydration: Secondary | ICD-10-CM | POA: Diagnosis not present

## 2019-07-09 DIAGNOSIS — R1084 Generalized abdominal pain: Secondary | ICD-10-CM

## 2019-07-09 DIAGNOSIS — D241 Benign neoplasm of right breast: Secondary | ICD-10-CM | POA: Diagnosis not present

## 2019-07-09 DIAGNOSIS — Z85020 Personal history of malignant carcinoid tumor of stomach: Secondary | ICD-10-CM | POA: Insufficient documentation

## 2019-07-09 DIAGNOSIS — R61 Generalized hyperhidrosis: Secondary | ICD-10-CM | POA: Insufficient documentation

## 2019-07-09 DIAGNOSIS — R1013 Epigastric pain: Secondary | ICD-10-CM | POA: Diagnosis not present

## 2019-07-09 DIAGNOSIS — I5022 Chronic systolic (congestive) heart failure: Secondary | ICD-10-CM | POA: Insufficient documentation

## 2019-07-09 DIAGNOSIS — F419 Anxiety disorder, unspecified: Secondary | ICD-10-CM | POA: Insufficient documentation

## 2019-07-09 DIAGNOSIS — Z95810 Presence of automatic (implantable) cardiac defibrillator: Secondary | ICD-10-CM | POA: Insufficient documentation

## 2019-07-09 LAB — COMPREHENSIVE METABOLIC PANEL
ALT: 21 U/L (ref 0–44)
AST: 27 U/L (ref 15–41)
Albumin: 4.1 g/dL (ref 3.5–5.0)
Alkaline Phosphatase: 76 U/L (ref 38–126)
Anion gap: 18 — ABNORMAL HIGH (ref 5–15)
BUN: 11 mg/dL (ref 6–20)
CO2: 28 mmol/L (ref 22–32)
Calcium: 8.6 mg/dL — ABNORMAL LOW (ref 8.9–10.3)
Chloride: 95 mmol/L — ABNORMAL LOW (ref 98–111)
Creatinine, Ser: 1 mg/dL (ref 0.44–1.00)
GFR calc Af Amer: >60 mL/min (ref >60)
GFR calc non Af Amer: >60 mL/min (ref >60)
Glucose, Bld: 116 mg/dL — ABNORMAL HIGH (ref 70–99)
Potassium: 2.5 mmol/L — CL (ref 3.5–5.1)
Sodium: 141 mmol/L (ref 135–145)
Total Bilirubin: 0.9 mg/dL (ref 0.3–1.2)
Total Protein: 7.6 g/dL (ref 6.5–8.1)

## 2019-07-09 LAB — CBC WITH DIFFERENTIAL/PLATELET
Abs Immature Granulocytes: 0.06 10*3/uL (ref 0.00–0.07)
Basophils Absolute: 0 10*3/uL (ref 0.0–0.1)
Basophils Relative: 0 %
Eosinophils Absolute: 0.2 10*3/uL (ref 0.0–0.5)
Eosinophils Relative: 2 %
HCT: 40.4 % (ref 36.0–46.0)
Hemoglobin: 13.9 g/dL (ref 12.0–15.0)
Immature Granulocytes: 1 %
Lymphocytes Relative: 31 %
Lymphs Abs: 3.2 10*3/uL (ref 0.7–4.0)
MCH: 29.9 pg (ref 26.0–34.0)
MCHC: 34.4 g/dL (ref 30.0–36.0)
MCV: 86.9 fL (ref 80.0–100.0)
Monocytes Absolute: 0.7 10*3/uL (ref 0.1–1.0)
Monocytes Relative: 7 %
Neutro Abs: 6.1 10*3/uL (ref 1.7–7.7)
Neutrophils Relative %: 59 %
Platelets: 291 10*3/uL (ref 150–400)
RBC: 4.65 MIL/uL (ref 3.87–5.11)
RDW: 12.8 % (ref 11.5–15.5)
WBC: 10.2 10*3/uL (ref 4.0–10.5)
nRBC: 0 % (ref 0.0–0.2)

## 2019-07-09 LAB — TROPONIN I (HIGH SENSITIVITY)
Troponin I (High Sensitivity): 16 ng/L (ref <18)
Troponin I (High Sensitivity): 15 ng/L (ref <18)

## 2019-07-09 LAB — LIPASE, BLOOD: Lipase: 26 U/L (ref 11–51)

## 2019-07-09 LAB — MAGNESIUM: Magnesium: 3.1 mg/dL — ABNORMAL HIGH (ref 1.7–2.4)

## 2019-07-09 MED ORDER — ALUM & MAG HYDROXIDE-SIMETH 200-200-20 MG/5ML PO SUSP
30.0000 mL | Freq: Once | ORAL | Status: AC
  Administered 2019-07-09: 05:00:00 30 mL via ORAL
  Filled 2019-07-09: qty 30

## 2019-07-09 MED ORDER — SODIUM CHLORIDE 0.9 % IV BOLUS
1000.0000 mL | Freq: Once | INTRAVENOUS | Status: AC
  Administered 2019-07-09: 1000 mL via INTRAVENOUS

## 2019-07-09 MED ORDER — DROPERIDOL 2.5 MG/ML IJ SOLN
1.2500 mg | Freq: Once | INTRAMUSCULAR | Status: AC
  Administered 2019-07-09: 05:00:00 1.25 mg via INTRAVENOUS
  Filled 2019-07-09: qty 2

## 2019-07-09 MED ORDER — KETOROLAC TROMETHAMINE 30 MG/ML IJ SOLN
15.0000 mg | INTRAMUSCULAR | Status: AC
  Administered 2019-07-09: 08:00:00 15 mg via INTRAVENOUS
  Filled 2019-07-09: qty 1

## 2019-07-09 MED ORDER — POTASSIUM CHLORIDE 10 MEQ/100ML IV SOLN
10.0000 meq | Freq: Once | INTRAVENOUS | Status: AC
  Administered 2019-07-09: 10 meq via INTRAVENOUS
  Filled 2019-07-09: qty 100

## 2019-07-09 MED ORDER — POTASSIUM CHLORIDE CRYS ER 20 MEQ PO TBCR
40.0000 meq | EXTENDED_RELEASE_TABLET | Freq: Once | ORAL | Status: AC
  Administered 2019-07-09: 07:00:00 40 meq via ORAL
  Filled 2019-07-09: qty 2

## 2019-07-09 MED ORDER — POTASSIUM CHLORIDE CRYS ER 20 MEQ PO TBCR: 20.0000 meq | EXTENDED_RELEASE_TABLET | Freq: Two times a day (BID) | ORAL | 0 refills | Status: DC

## 2019-07-09 MED ORDER — MAGNESIUM SULFATE 2 GM/50ML IV SOLN
2.0000 g | Freq: Once | INTRAVENOUS | Status: AC
  Administered 2019-07-09: 07:00:00 2 g via INTRAVENOUS
  Filled 2019-07-09: qty 50

## 2019-07-09 MED ORDER — PROMETHAZINE HCL 25 MG PO TABS: 25.0000 mg | ORAL_TABLET | Freq: Four times a day (QID) | ORAL | 0 refills | Status: DC | PRN

## 2019-07-09 NOTE — ED Triage Notes (Addendum)
Patient to ER for chest pain that patient woke up with this am. Patient has h/o MI 2 years ago, states feels the same as then. Patient also reports pain radiating into left neck. +Diaphoresis at home. +Nausea.

## 2019-07-09 NOTE — ED Notes (Signed)
Pt given graham crackers, peanut butter and ginger ale for PO challenge.

## 2019-07-09 NOTE — ED Notes (Signed)
ED Provider at bedside. 

## 2019-07-09 NOTE — ED Notes (Signed)
Patient reports taking 3 nitroglycerin tablets PTA with no relief of pain.

## 2019-07-09 NOTE — ED Notes (Signed)
Patient transported to X-ray 

## 2019-07-09 NOTE — ED Provider Notes (Signed)
Procedures  Clinical Course as of Jul 08 904  Tue Jul 09, 2019  0514 Of note, biliary disease is unlikely given that the patient has a prior cholecystectomy.  Theoretically she could have retained stone or hepatic stone leading to choledocholithiasis but I find this unlikely given her history.   [CF]  0614 Generally reassuring, planning for 2 hour repeat  Troponin I (High Sensitivity): 16 [CF]  0615 Normal CBC  CBC with Differential/Platelet [CF]  0640 The patient's potassium is low tonight but looking back through medical record I see that it has been almost a low in the past with several measurements and a 2.6 and 2.7 range.  Her EKG is reassuring.  I have ordered 40 mEq by mouth, a 10 mill equivalents IV, and 2 g of magnesium IV to help with absorption.  Potassium(!!): 2.5 [CF]  0731 Dr. Joni Fears will follow up on the repeat high-sensitivity troponin and reassess the patient.  She is getting 1 L of fluids at this time.  There is not a clear indication for inpatient treatment currently but will depend on her clinical status after the rest of the lab work and IV fluids.   [CF]  [id number] Blood pressure has come up substantially and I suspect that the initial low pressure was after she received the droperidol and was sleeping.   [CF]    Clinical Course User Index [CF] Hinda Kehr, MD    ----------------------------------------- 9:05 AM on 07/09/2019 ----------------------------------------- Patient reports feeling fine.  She is awake and sitting upright, eating.  Vital signs are normal.  Abdomen is benign.  Patient is agreeable with going home and following up with her specialist cancer doctor and gastroenterologist that she has through [facility].  She reports that she is part of the pain management program but avoid using the pain medicines unless she absolutely needs it due to concerns about dependence.  Second troponin is negative, potassium is being replaced p.o. and IV and I will continue her  on oral potassium supplementation for the next week.    Carrie Mew, MD 07/09/19 [phone]

## 2019-07-09 NOTE — ED Provider Notes (Signed)
[facility] Emergency Department Provider Note  ____________________________________________   First MD Initiated Contact with Patient 07/09/19 [phone]     (approximate)  I have reviewed the triage vital signs and the nursing notes.   HISTORY  Chief Complaint Chest Pain    HPI Brittany Nguyen is a 53 y.o. female with history as listed below including 6 ED visits within the last 6 months.  She presents tonight for evaluation of chest pain that reportedly woke her up just prior to arrival.  She reports that she has a history of myocardial infarction 2 years ago and says that it feels similar.  She reports the pain is moderate to severe and radiates from the lower middle part of her chest to the left side of her neck.  She reports that she is sweating at home even though she does not have any swelling now.  Nothing particular makes his symptoms better or worse.  She has had some nausea but no vomiting.  She denies abdominal pain although she does have an extensive history of chronic abdominal pain, specifically in the epigastrium.  She says that she does not eat after 7 PM because of concerns that she will have difficulty overnight with acid reflux and epigastric pain.  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  Of note, she reports that she took 3 nitroglycerin tablets at home that did not change her discomfort.        Past Medical History:  Diagnosis Date  . Asthma 2013  . 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 CHF (congestive heart failure) ([place])   . COPD (chronic obstructive pulmonary disease) ([place])   . Diverticulitis 2010  . DVT (deep venous thrombosis) ([place])   . Endometriosis 1990  . Heart disease 2013  . Hx  MRSA infection   . Iron deficiency   . Lump or mass in breast   . Restless leg   . 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 Iron deficiency anemia 03/27/2018  . Anemia 03/26/2018  . Abdominal pain 02/20/2018  . Unstable angina ([place])   . Encounter for anticoagulation discussion and counseling   . C. difficile colitis 01/14/2018  . GIB (gastrointestinal bleeding) 01/08/2018  . SOB (shortness of breath) 11/01/2017  . Influenza A 10/29/2017  . Acute respiratory failure ([place]) 09/24/2017  . Sepsis ([place]) 07/03/2017  . COPD (chronic obstructive pulmonary disease) ([place]) 04/26/2017  . Community acquired pneumonia 04/25/2017  . Chronic systolic heart failure ([place]) 02/03/2017  . Hypotension 02/03/2017  . Chest pain 12/09/2016  . RLS (restless legs syndrome) 12/09/2016  . Cough, persistent 11/22/2015  . Hypokalemia 11/22/2015  . Leukocytosis 11/22/2015  . Dyspnea 11/21/2015  . Respiratory distress 09/19/2015  . Breast pain 01/11/2013  . Hx MRSA infection   . Lump or mass in breast   . GOITER, MULTINODULAR 01/20/2009  . HYPOGLYCEMIA, UNSPECIFIED 01/20/2009  . GERD 01/20/2009  . DIVERTICULITIS OF COLON 01/20/2009    Past Surgical History:  Procedure Laterality Date  . ABDOMINAL HYSTERECTOMY     age 65  . BREAST BIOPSY Right 2014   benign  . CARDIAC DEFIBRILLATOR PLACEMENT    . CARDIAC DEFIBRILLATOR  PLACEMENT    . CESAREAN SECTION    . CHOLECYSTECTOMY    . COLECTOMY    . COLONOSCOPY WITH PROPOFOL N/A 02/22/2018   Procedure: COLONOSCOPY WITH PROPOFOL;  Surgeon: Toledo, Benay Pike, MD;  Location: [facility] ENDOSCOPY;  Service: Gastroenterology;  Laterality: N/A;  . ESOPHAGOGASTRODUODENOSCOPY (EGD) WITH PROPOFOL N/A 02/22/2018   Procedure: ESOPHAGOGASTRODUODENOSCOPY (EGD) WITH PROPOFOL;  Surgeon: Toledo, Benay Pike, MD;  Location: [facility] ENDOSCOPY;  Service: Gastroenterology;  Laterality: N/A;  . NASAL SINUS SURGERY  2012  . OOPHORECTOMY      Prior  to Admission medications   Medication Sig Start Date End Date Taking? Authorizing Provider  albuterol (ACCUNEB) 1.25 MG/3ML nebulizer solution Take 3 mLs by nebulization every 6 (six) hours as needed for wheezing. 11/09/17   [provider]  albuterol (VENTOLIN HFA) 108 (90 Base) MCG/ACT inhaler Inhale 2 puffs into the lungs every 6 (six) hours as needed for shortness of breath. 08/28/16   [provider]  beclomethasone (QVAR) 80 MCG/ACT inhaler Inhale 2 puffs into the lungs 2 (two) times daily. 06/05/17 10/03/18  Laverle Hobby, MD  beclomethasone (QVAR) 80 MCG/ACT inhaler Inhale 1 puff into the lungs 2 (two) times daily. 06/12/17   [provider]  busPIRone (BUSPAR) 15 MG tablet Take 15 mg by mouth 2 (two) times daily. 05/08/18   [provider]  doxycycline (VIBRAMYCIN) 100 MG capsule Take 1 capsule (100 mg total) by mouth 2 (two) times daily. Patient not taking: Reported on 05/02/2019 02/27/19   Johnn Hai, PA-C  famotidine (PEPCID) 20 MG tablet Take 1 tablet (20 mg total) by mouth 2 (two) times daily. 03/15/19   Carrie Mew, MD  fentaNYL (DURAGESIC) 50 MCG/HR Place 1 patch onto the skin every 3 (three) days. 10/08/18   Epifanio Lesches, MD  furosemide (LASIX) 40 MG tablet Take 80 mg by mouth 2 (two) times daily.  07/09/18   [provider]  hydrOXYzine (ATARAX/VISTARIL) 25 MG tablet Take 25 mg by mouth every 6 (six) hours as needed for itching. 04/13/19   [provider]  levothyroxine (SYNTHROID, LEVOTHROID) 150 MCG tablet Take 150 mcg by mouth daily before breakfast.    [provider]  losartan (COZAAR) 25 MG tablet Take 25 mg by mouth daily.     [provider]  metoprolol succinate (TOPROL-XL) 50 MG 24 hr tablet Take 50 mg by mouth 2 (two) times daily. 01/25/19   [provider]  mirtazapine (REMERON) 15 MG tablet Take 15 mg by mouth at bedtime. 04/26/19 05/26/24  [provider]   nitroGLYCERIN (NITROSTAT) 0.4 MG SL tablet Place 1 tablet (0.4 mg total) under the tongue every 5 (five) minutes as needed for chest pain. 01/24/17   Henreitta Leber, MD  omeprazole (PRILOSEC) 40 MG capsule Take 40 mg by mouth 2 (two) times daily. 10/19/18   [provider]  oxyCODONE (OXY IR/ROXICODONE) 5 MG immediate release tablet Take 5-10 mg by mouth every 4 (four) hours as needed for pain. 04/11/19   [provider]  oxyCODONE-acetaminophen (PERCOCET) 5-325 MG tablet Take 1 tablet by mouth every 6 (six) hours as needed for severe pain. Patient not taking: Reported on 05/02/2019 02/27/19   Johnn Hai, PA-C  Pancrelipase, Lip-Prot-Amyl, (CREON) 24000-76000 units CPEP Take 3-4 capsules by mouth 3 (three) times daily with meals. 04/01/19   [provider]  potassium chloride SA (KLOR-CON M20) 20 MEQ tablet Take 1 tablet (20 mEq total) by mouth 2 (two) times daily for 7  days. Patient taking differently: Take 40 mEq by mouth daily as needed. 1 tablet twice a day 06/17/18 05/02/19  Hinda Kehr, MD  promethazine (PHENERGAN) 25 MG suppository Place 1 suppository (25 mg total) rectally every 6 (six) hours as needed for nausea. Patient not taking: Reported on 05/02/2019 03/15/19   Carrie Mew, MD  promethazine (PHENERGAN) 25 MG tablet Take 25 mg by mouth every 6 (six) hours as needed. 03/20/19   [provider]  rOPINIRole (REQUIP) 4 MG tablet Take 4 mg by mouth at bedtime. 01/30/19   [provider]  spironolactone (ALDACTONE) 50 MG tablet Take 50 mg by mouth daily. 11/26/18   [provider]  sucralfate (CARAFATE) 1 g tablet Take 1 tablet (1 g total) by mouth 4 (four) times daily. 03/15/19   Carrie Mew, MD  vitamin B-12 (CYANOCOBALAMIN) 1000 MCG tablet Take 1,000 mcg by mouth daily.    [provider]    Allergies Contrast media [iodinated diagnostic agents], Lidocaine, Metrizamide, Penicillins, Isosorbide nitrate, Latex,  Ondansetron, Zofran [ondansetron hcl], Hydromorphone, Povidone-iodine, and Pulmicort [budesonide]  Family History  Problem Relation Age of Onset  . Cancer Mother 30       ovarian  . CAD Mother   . Cancer Father 24       brain  . CAD Father   . Cancer Daughter 18       skin  . Cancer Maternal Aunt 69       breast  . Leukemia Paternal Grandfather     Social History Social History   Tobacco Use  . Smoking status: Never Smoker  . Smokeless tobacco: Never Used  Substance Use Topics  . Alcohol use: No  . Drug use: No    Review of Systems Constitutional: No fever/chills Eyes: No visual changes. ENT: No sore throat. Cardiovascular: Denies chest pain. Respiratory: Denies shortness of breath. Gastrointestinal: No abdominal pain.  No nausea, no vomiting.  No diarrhea.  No constipation. Genitourinary: Negative for dysuria. Musculoskeletal: Negative for neck pain.  Negative for back pain. Integumentary: Negative for rash. Neurological: Negative for headaches, focal weakness or numbness.   ____________________________________________   PHYSICAL EXAM:  VITAL SIGNS: ED Triage Vitals  Enc Vitals Group     BP 07/09/19 0450 110/75     Pulse Rate 07/09/19 0450 89     Resp 07/09/19 0450 20     Temp 07/09/19 0450 98.2 F (36.8 C)     Temp Source 07/09/19 0450 Oral     SpO2 07/09/19 0450 96 %     Weight 07/09/19 0447 99.8 kg (220 lb)     Height 07/09/19 0447 1.651 m (5\' 5" )     Head Circumference --      Peak Flow --      Pain Score 07/09/19 0447 9     Pain Loc --      Pain Edu? --      Excl. in [place]? --     Constitutional: Alert and oriented.  Appears anxious or nervous but otherwise is in no acute distress. Eyes: Conjunctivae are normal.  Head: Atraumatic. Nose: No congestion/rhinnorhea. Mouth/Throat: Patient is wearing a mask. Neck: No stridor.  No meningeal signs.   Cardiovascular: Normal rate, regular rhythm. Good peripheral circulation. Grossly normal heart  sounds. Respiratory: Normal respiratory effort.  No retractions. Gastrointestinal: Obese.  Soft and nondistended.  Moderate to severe tenderness to palpation of the epigastrium.  No lower abdominal tenderness. Musculoskeletal: No lower extremity tenderness nor edema. No gross deformities  of extremities. Neurologic:  Normal speech and language. No gross focal neurologic deficits are appreciated.  Skin:  Skin is warm, dry and intact. Psychiatric: Mood and affect are normal. Speech and behavior are normal.  ____________________________________________   LABS (all labs ordered are listed, but only abnormal results are displayed)  Labs Reviewed  BASIC METABOLIC PANEL  CBC  TROPONIN I (HIGH SENSITIVITY)   ____________________________________________  EKG  ED ECG REPORT I, Hinda Kehr, the attending physician, personally viewed and interpreted this ECG.  Date: 07/09/2019 EKG Time: 4:46 AM Rate: 92 Rhythm: normal sinus rhythm QRS Axis: Left axis deviation Intervals: normal ST/T Wave abnormalities: Non-specific ST segment / T-wave changes, but no clear evidence of acute ischemia. Narrative Interpretation: no definitive evidence of acute ischemia; does not meet STEMI criteria.   ____________________________________________  RADIOLOGY I, Hinda Kehr, personally viewed and evaluated these images (plain radiographs) as part of my medical decision making, as well as reviewing the written report by the radiologist.  ED MD interpretation: No acute abnormalities identified on two-view chest x-ray.  Official radiology report(s): No results found.  ____________________________________________   PROCEDURES   Procedure(s) performed (including Critical Care):  Procedures   ____________________________________________   INITIAL IMPRESSION / MDM / [place] / ED COURSE  As part of my medical decision making, I reviewed the following data within the electronic medical  record:  Nursing notes reviewed and incorporated, Labs reviewed , EKG interpreted , Old EKG reviewed, Old chart reviewed, Patient signed out to Dr. Joni Fears, Radiograph reviewed  and Notes from prior ED visits   Differential diagnosis includes, but is not limited to, acute on chronic epigastric pain, acid reflux, developing gastritis, ACS, PE, pneumonia, anxiety/panic attack.  The patient's vital signs are stable, no tachycardia, no hypoxemia.  She has highly reproducible epigastric tenderness to palpation with a long history of epigastric and "stomach" pain.  She also has a history of DVT and she reports a history of an MI and I do see "unstable angina" listed on her medical history but I did not find in the medical record specifically were mention that she had had a myocardial infarction.  I think that a D-dimer is of limited utility and will almost certainly be (falsely) positive if I tried to use it as a rule out for pulmonary embolism, however it is reassuring that her Wells criteria for pulmonary embolism gives her a score of only 1.5 points based on the prior history of DVT.  Given her normal and stable vitals I think that further investigation of PE is not indicated her necessary.  The patient takes chronic pain medicine and has extensive allergy list and has a number of comorbidities and chronic medical issues.  I will check standard lab work including a high-sensitivity troponin and anticipate checking a repeat in 2 hours as per protocol even if the first 1 is normal.  However have a low suspicion for ACS.  To treat her more immediate symptoms, I have ordered a dose of Maalox (she has reported history to lidocaine) as well as a small dose of droperidol 1.25 mg IV.  I am using a lower dose for help with her chronic pain and her nausea as well as because her QTc interval is slightly longer than usual tonight; even though it is currently less than 500, and based on prior EKGs is usually around 400, it is  a little bit longer tonight however I think that the clinical benefit outweighs the risk given that is  actually quite a safe medication, she is symptomatic with nausea, and any medication I gave her for nausea has theoretical QTC prolongation issues.  We will continue to monitor her.      Clinical Course as of Jul 08 804  Tue Jul 09, 2019  0514 Of note, biliary disease is unlikely given that the patient has a prior cholecystectomy.  Theoretically she could have retained stone or hepatic stone leading to choledocholithiasis but I find this unlikely given her history.   [CF]  0614 Generally reassuring, planning for 2 hour repeat  Troponin I (High Sensitivity): 16 [CF]  0615 Normal CBC  CBC with Differential/Platelet [CF]  0640 The patient's potassium is low tonight but looking back through medical record I see that it has been almost a low in the past with several measurements and a 2.6 and 2.7 range.  Her EKG is reassuring.  I have ordered 40 mEq by mouth, a 10 mill equivalents IV, and 2 g of magnesium IV to help with absorption.  Potassium(!!): 2.5 [CF]  0731 Dr. Joni Fears will follow up on the repeat high-sensitivity troponin and reassess the patient.  She is getting 1 L of fluids at this time.  There is not a clear indication for inpatient treatment currently but will depend on her clinical status after the rest of the lab work and IV fluids.   [CF]  [id number] Blood pressure has come up substantially and I suspect that the initial low pressure was after she received the droperidol and was sleeping.   [CF]    Clinical Course User Index [CF] Hinda Kehr, MD     ____________________________________________  FINAL CLINICAL IMPRESSION(S) / ED DIAGNOSES  Final diagnoses:  None     MEDICATIONS GIVEN DURING THIS VISIT:  Medications - No data to display   ED Discharge Orders    None      *Please note:  Brittany Nguyen was evaluated in Emergency Department on 07/09/2019 for the  symptoms described in the history of present illness. She was evaluated in the context of the global COVID-19 pandemic, which necessitated consideration that the patient might be at risk for infection with the SARS-CoV-2 virus that causes COVID-19. Institutional protocols and algorithms that pertain to the evaluation of patients at risk for COVID-19 are in a state of rapid change based on information released by regulatory bodies including the CDC and federal and state organizations. These policies and algorithms were followed during the patient's care in the ED.  Some ED evaluations and interventions may be delayed as a result of limited staffing during the pandemic.*  Note:  This document was prepared using Dragon voice recognition software and may include unintentional dictation errors.   Hinda Kehr, MD 07/09/19 [phone]

## 2019-07-15 IMAGING — CR DG KNEE COMPLETE 4+V*R*
1 series · 4 of 4 positions shown · non-contrast
Comparison: None.

CLINICAL DATA: Chronic knee pain. Acutely worsened after falling
today.

EXAM:
RIGHT KNEE - COMPLETE 4+ VIEW

[Series 1: x knee ap right · 0.14mm/px · 4 of 4 slices shown]
[im 1/4]
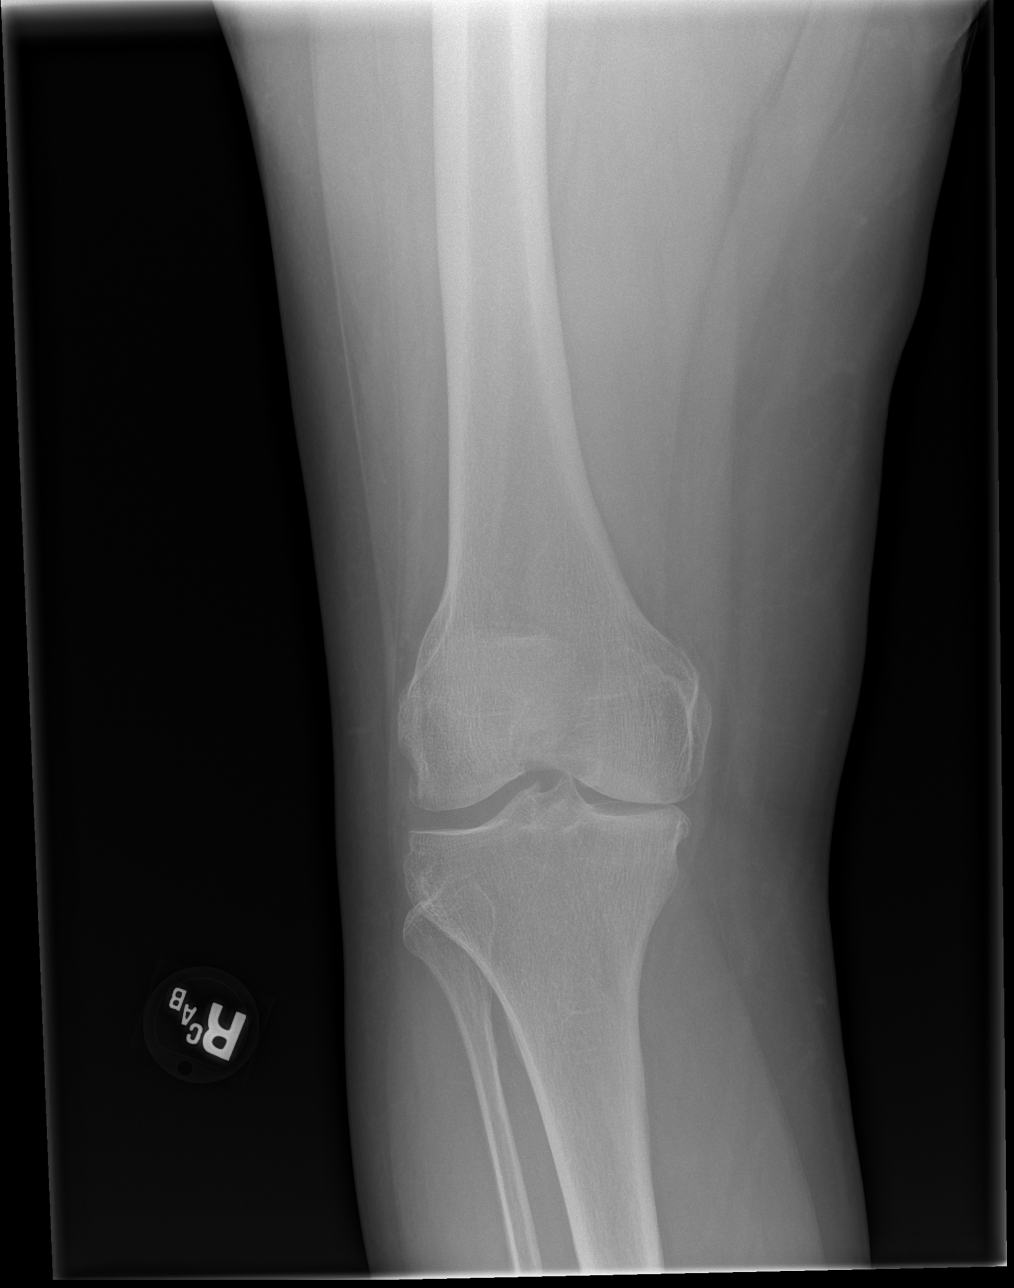
[im 2/4]
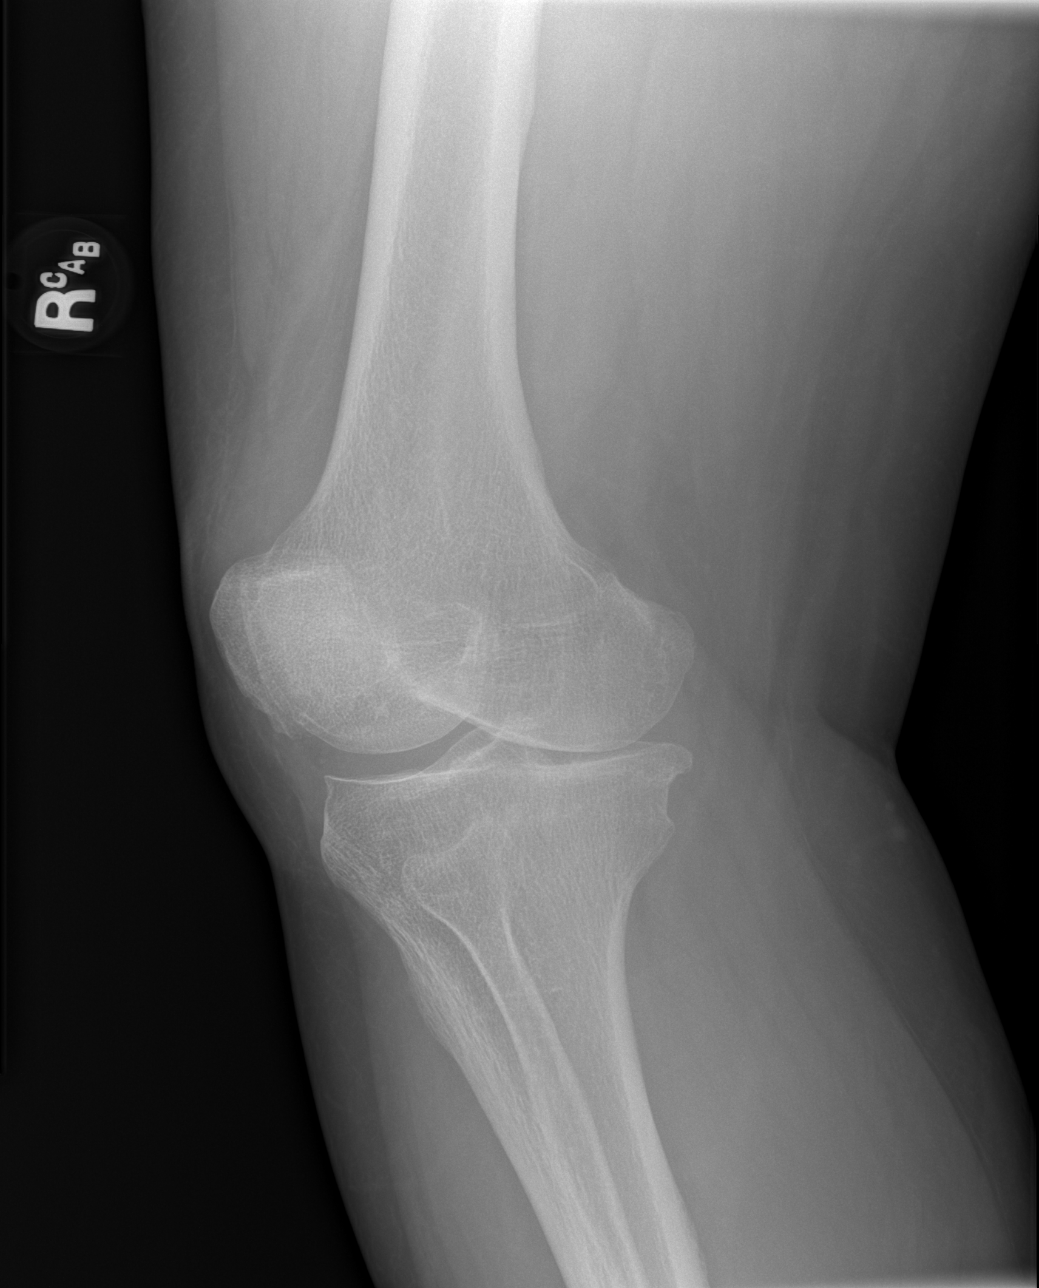
[im 3/4]
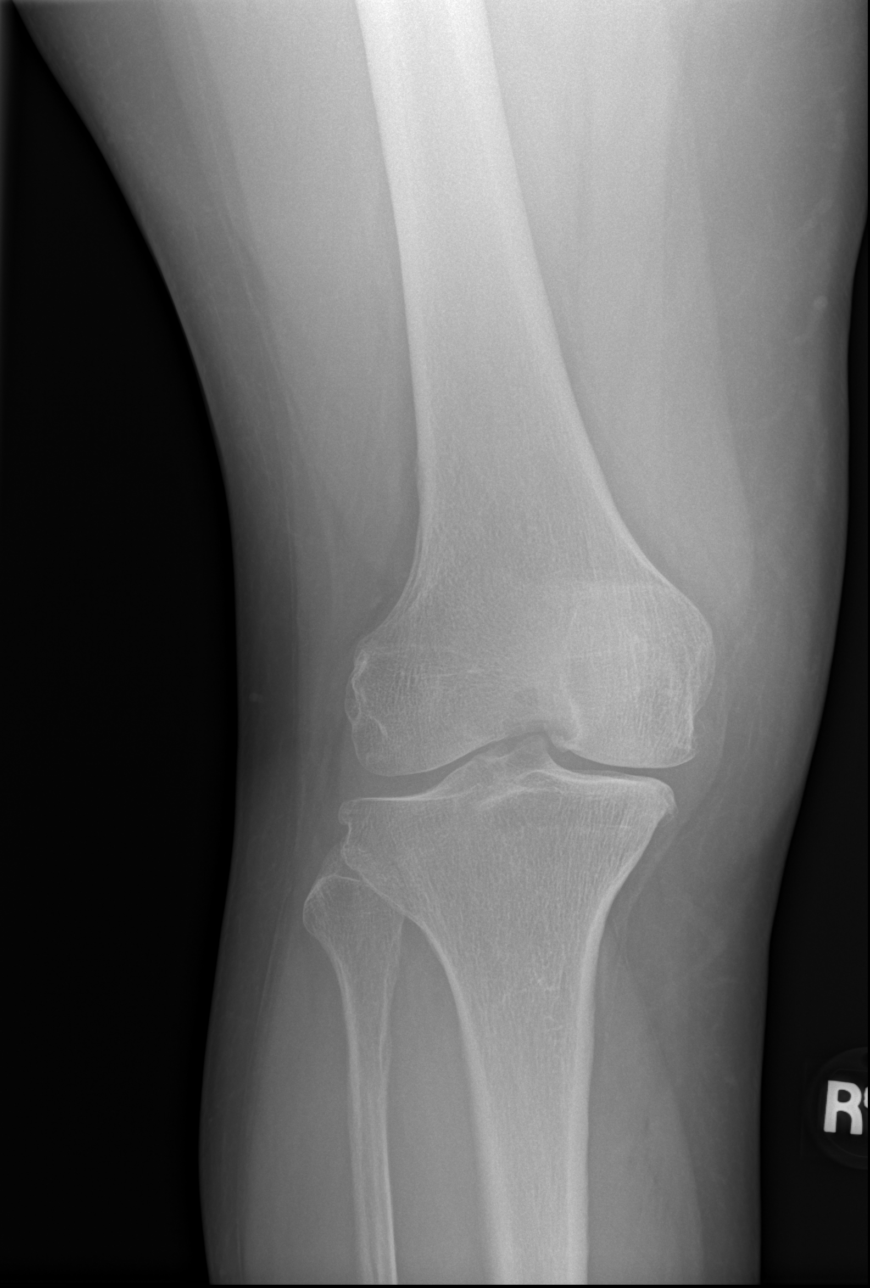
[im 4/4]
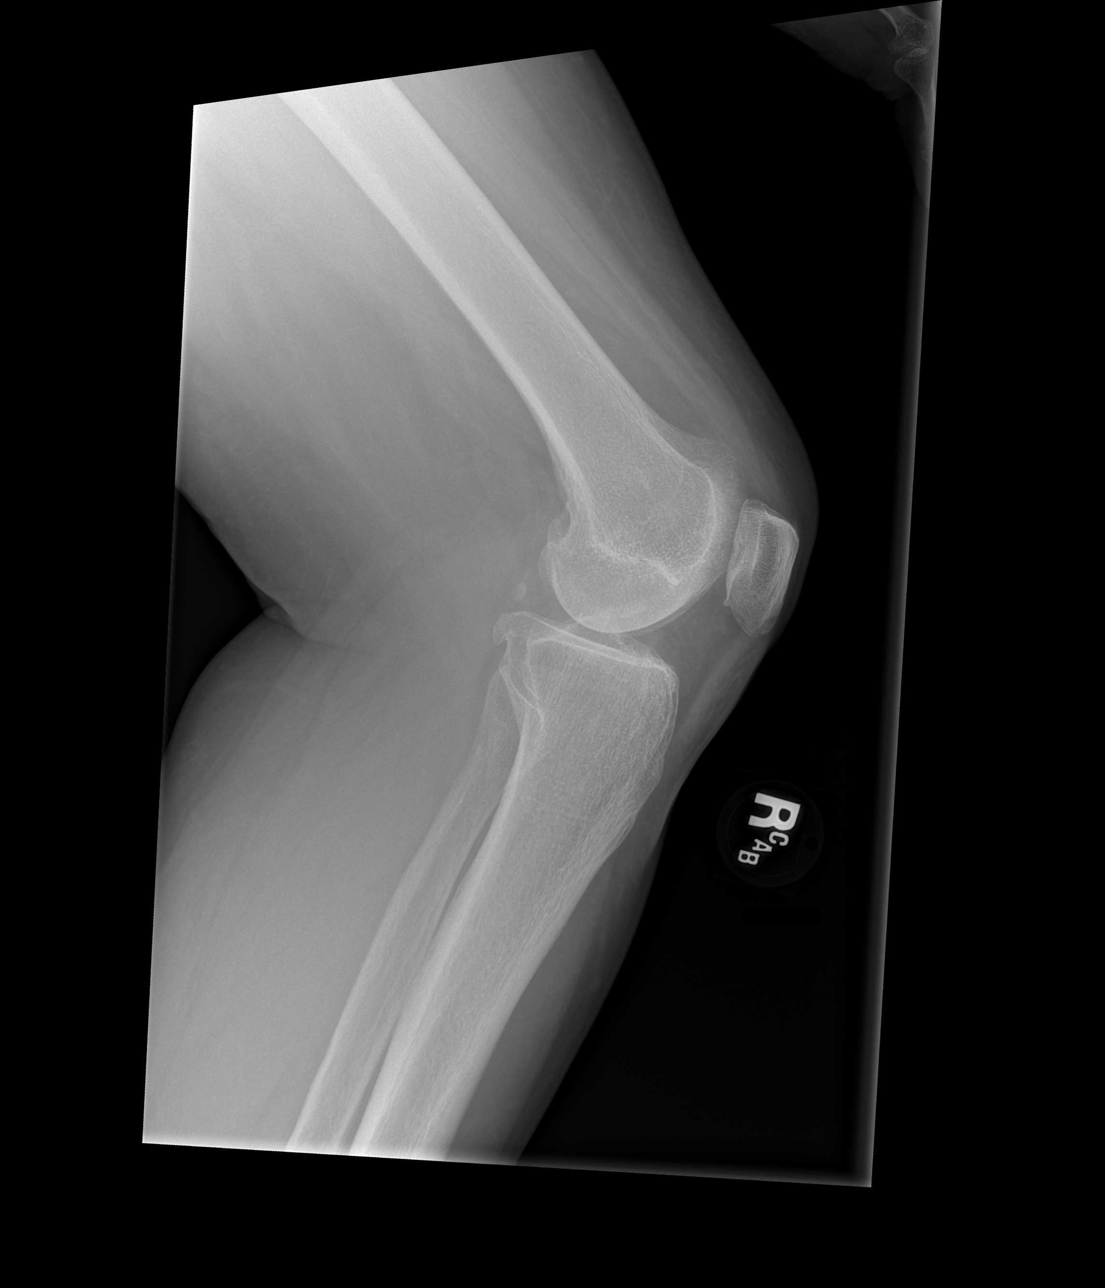

[4 of 4 positions shown; findings below may reference images not displayed]

FINDINGS: No joint effusion. No fracture or dislocation. Medial compartment
osteoarthritis with joint space narrowing and marginal osteophytes.
IMPRESSION: Medial compartment osteoarthritis.  No acute finding.

## 2019-07-20 IMAGING — CR DG CHEST 2V
2 series · 2 of 2 positions shown · non-contrast
Comparison: Radiographs April 04, 2018.

CLINICAL DATA: Chest pain.

EXAM:
CHEST - 2 VIEW

[chest pa]
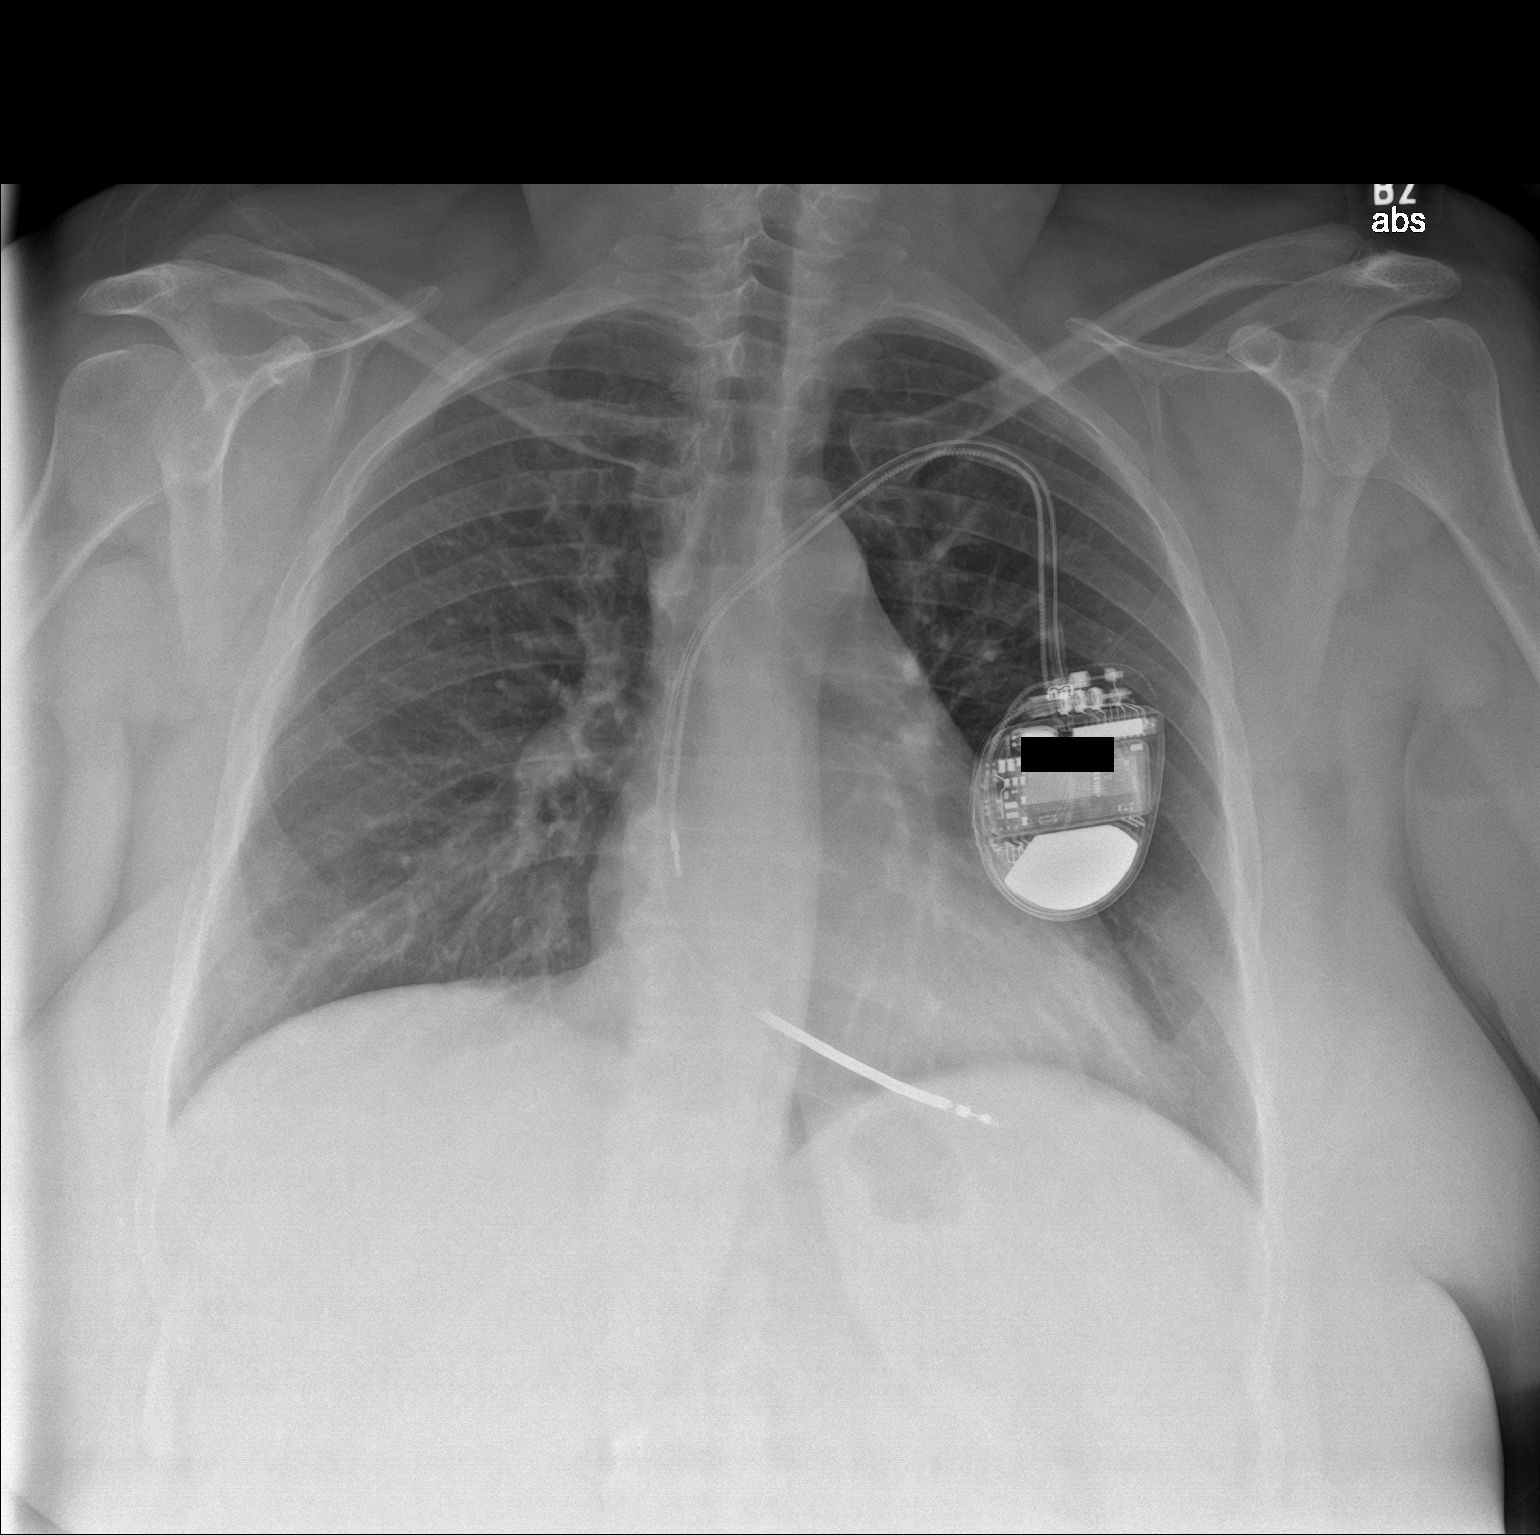

[chest lat]
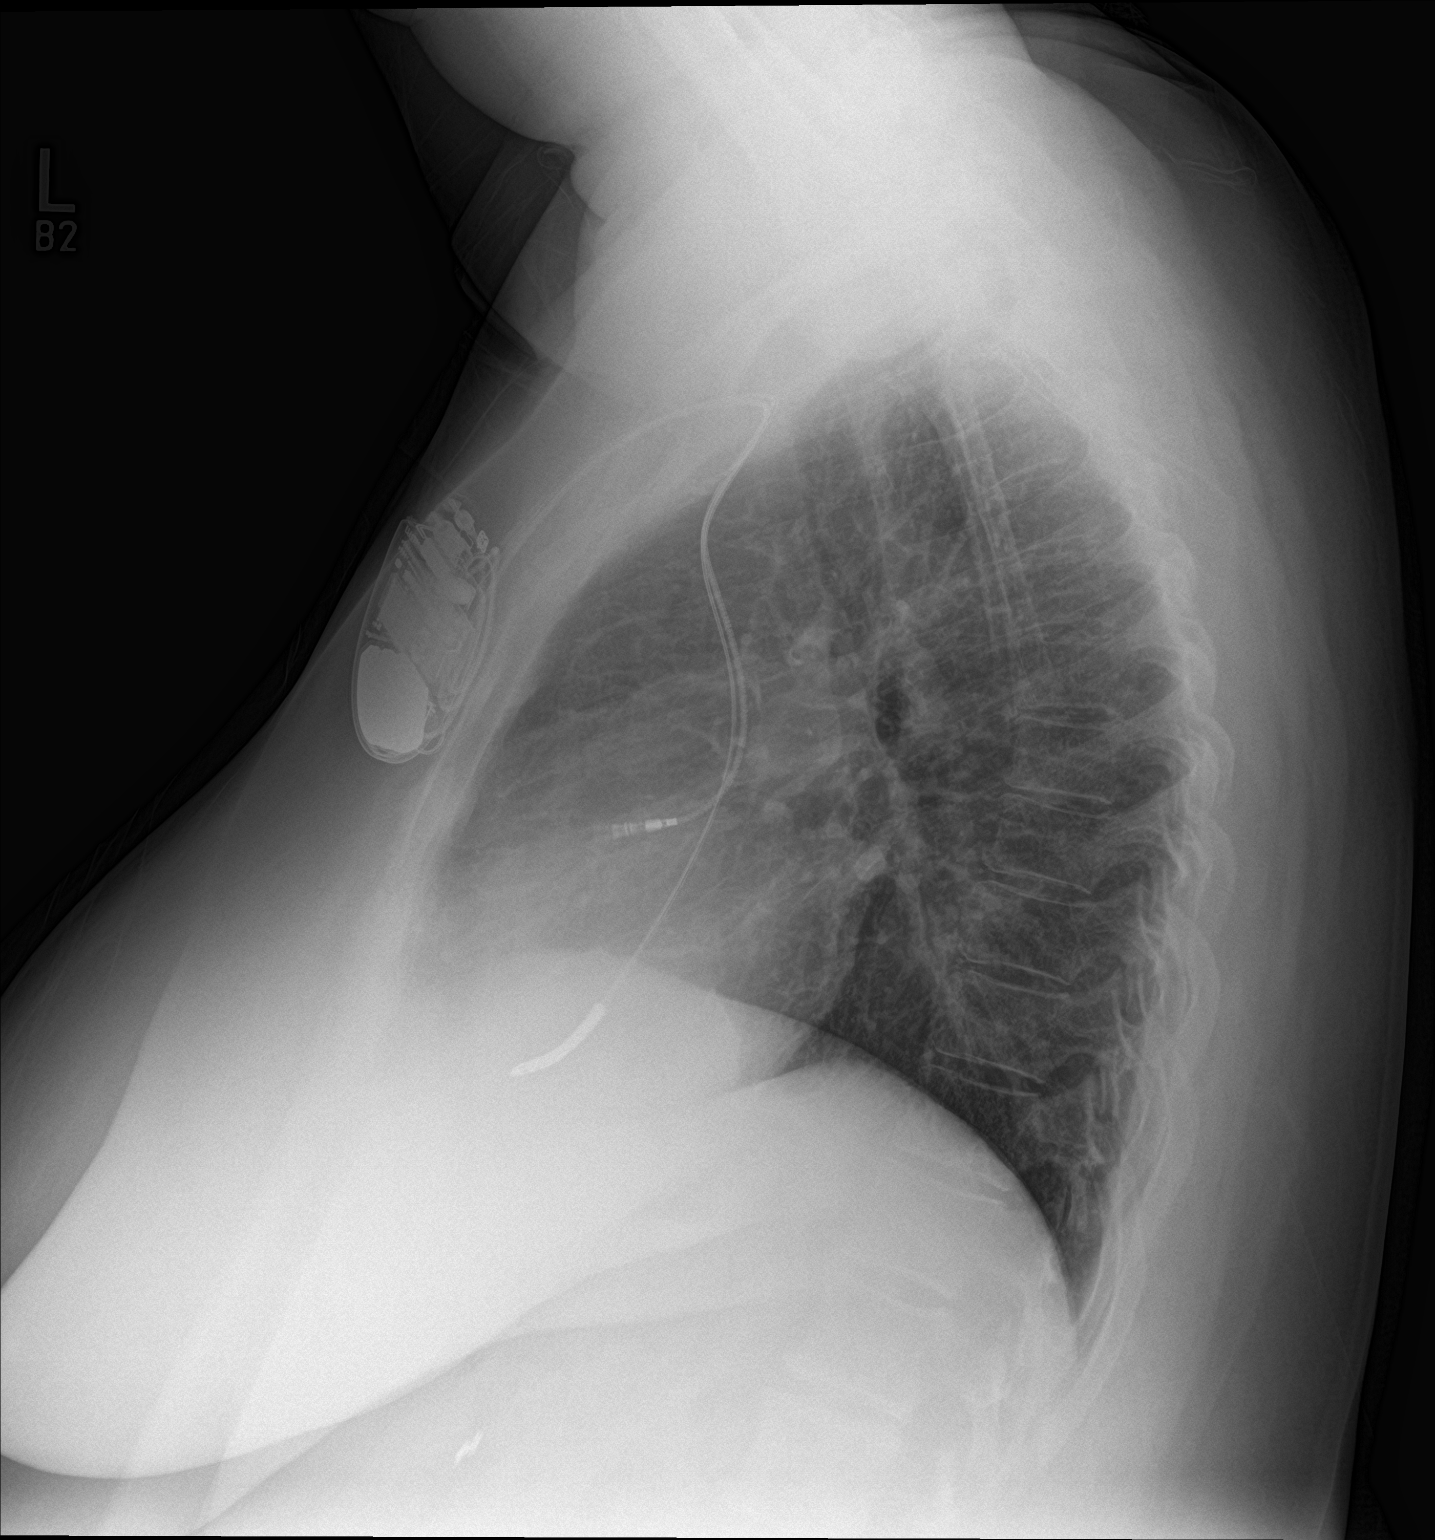

[2 of 2 positions shown; findings below may reference images not displayed]

FINDINGS: The heart size and mediastinal contours are within normal limits.
Both lungs are clear. Left-sided pacemaker is unchanged in position.
No pneumothorax or pleural effusion is noted. The visualized
skeletal structures are unremarkable.
IMPRESSION: No active cardiopulmonary disease.

## 2019-07-20 IMAGING — CT CT HEAD W/O CM
3 of 4 series · 15 of 47 positions shown, 18 images · non-contrast
Comparison: Head CT 12/08/2014

CLINICAL DATA: Acute onset headache

EXAM:
CT HEAD WITHOUT CONTRAST
TECHNIQUE: Contiguous axial images were obtained from the base of the skull
through the vertex without intravenous contrast.

[Series 2: head wo · axial · 0.40mm/px · z∈[+212,+342]mm · 9 of 30 slices shown, 12 images]
[im 2/30  brain]
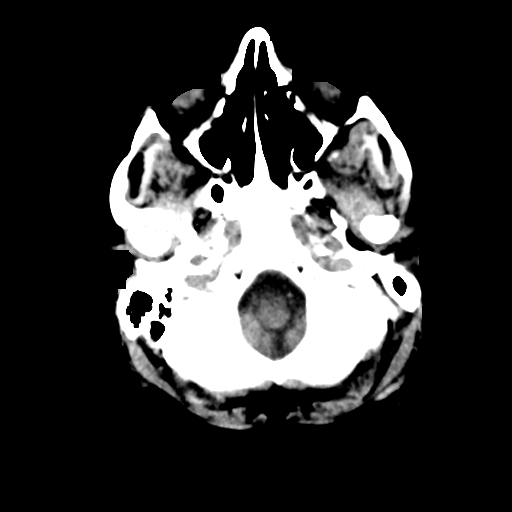
[im 2/30  bone]
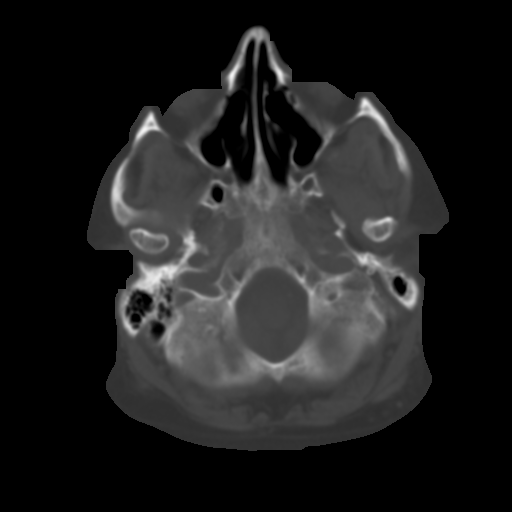
[im 6/30  brain]
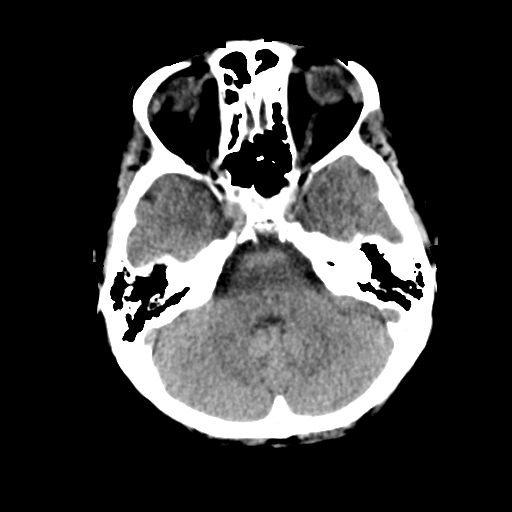
[im 8/30  brain]
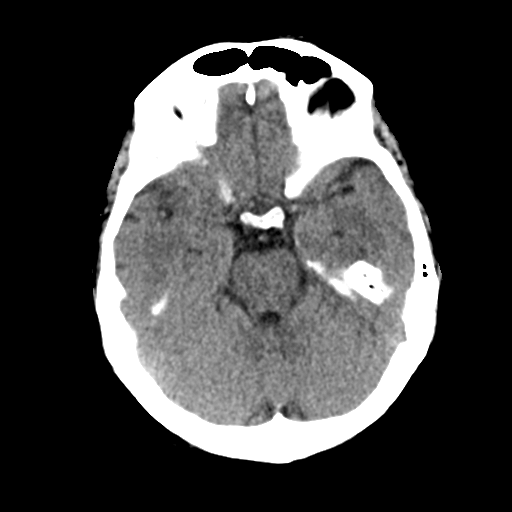
[im 12/30  brain]
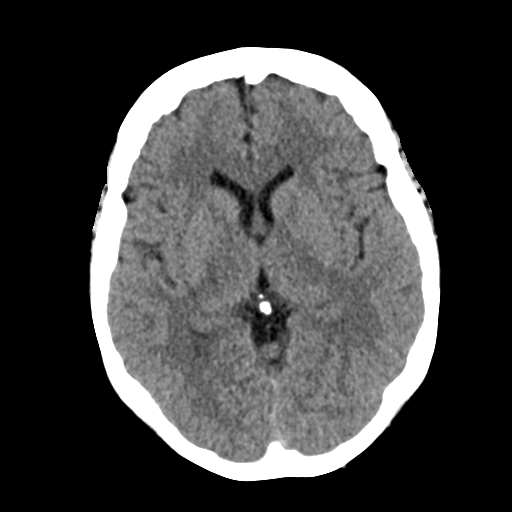
[im 16/30  brain]
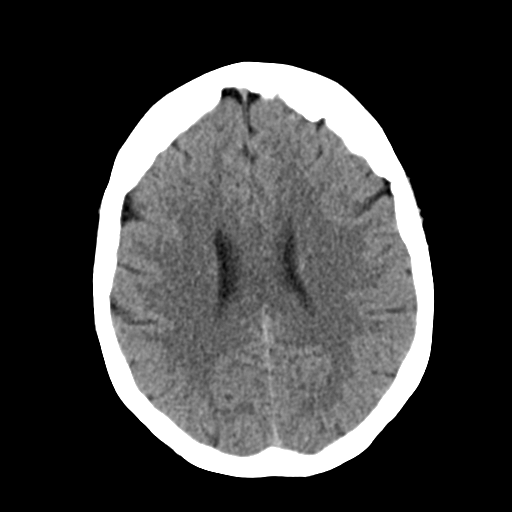
[im 16/30  bone]
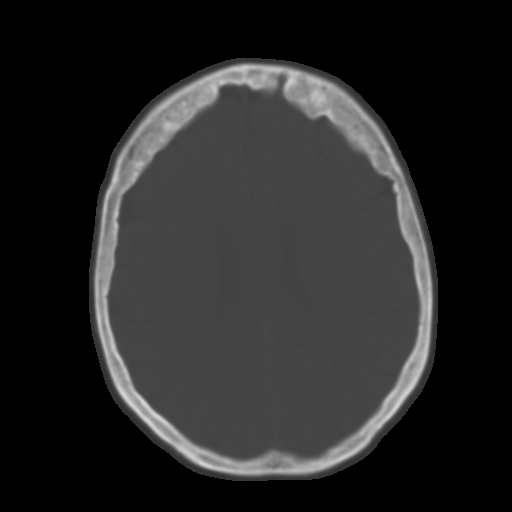
[im 18/30  brain]
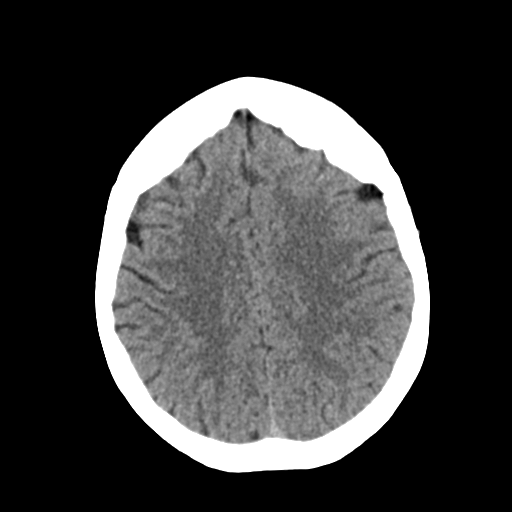
[im 22/30  brain]
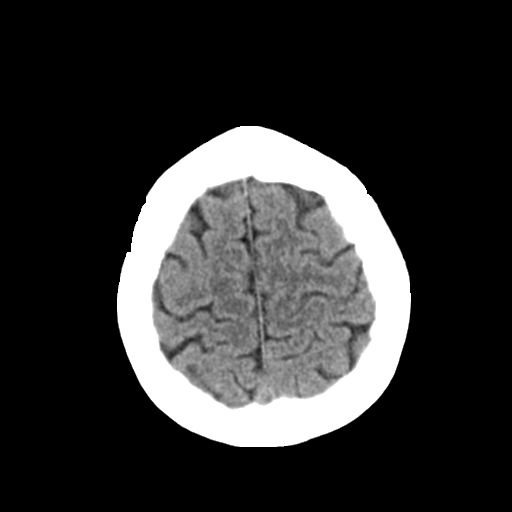
[im 24/30  brain]
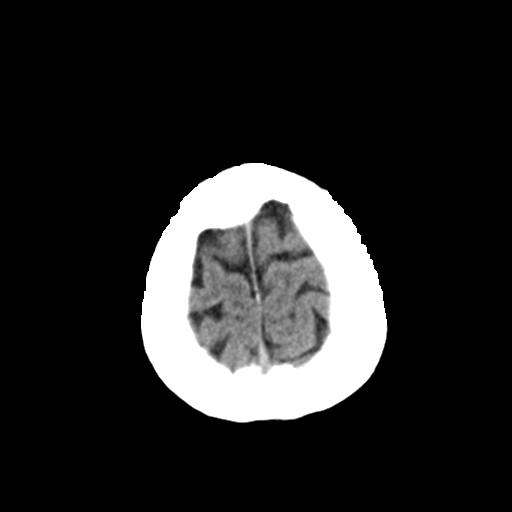
[im 28/30  brain]
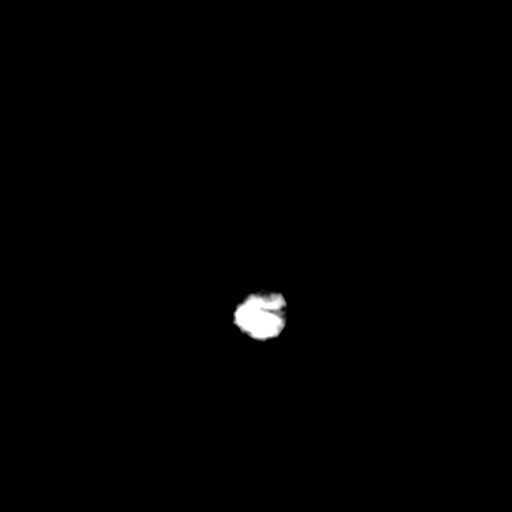
[im 28/30  bone]
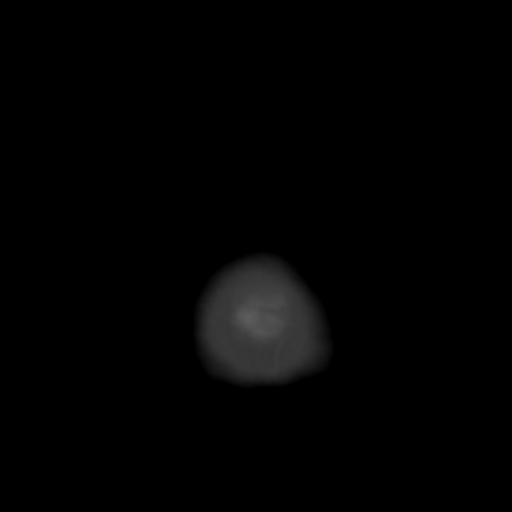

[Series 4: coronal soft tissue · coronal · 0.29mm/px · 3 of 59 slices shown]
[im 20/59  brain]
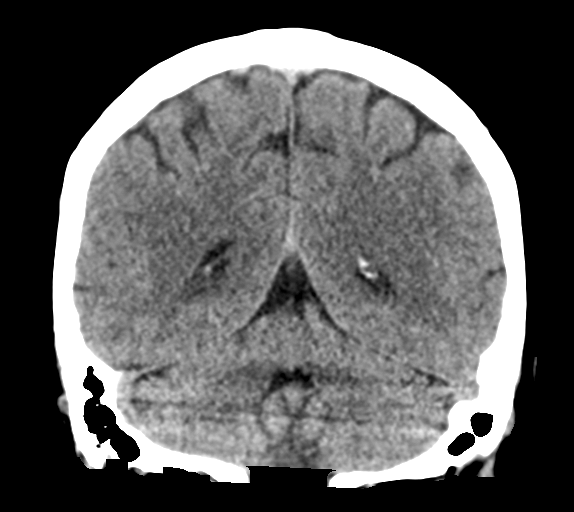
[im 26/59  brain]
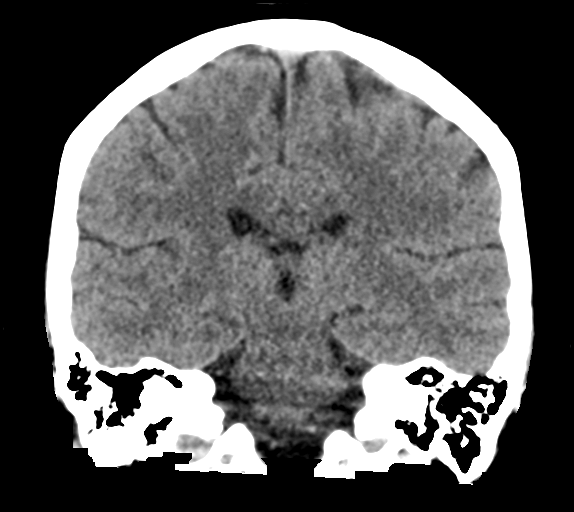
[im 33/59  brain]
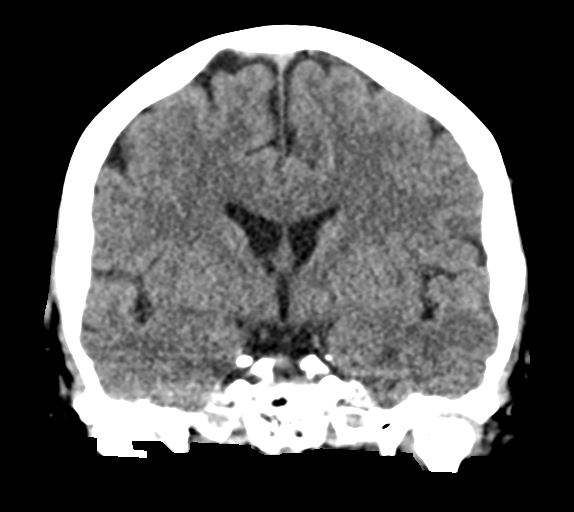

[Series 5: sagittal soft tissue · sagittal · 0.30mm/px · 3 of 50 slices shown]
[im 17/50  brain]
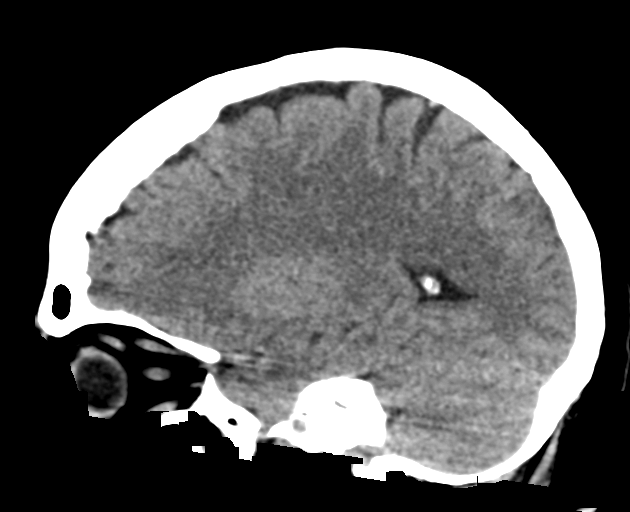
[im 25/50  brain]
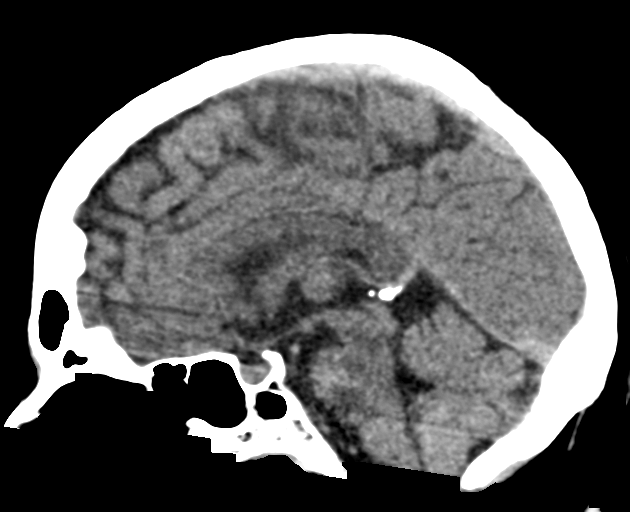
[im 33/50  brain]
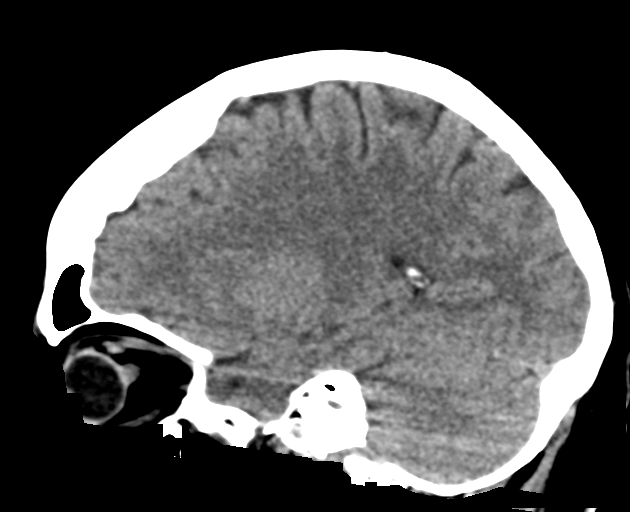

[15 of 47 positions shown; findings below may reference images not displayed]

FINDINGS: Brain: There is no mass, hemorrhage or extra-axial collection. The
size and configuration of the ventricles and extra-axial CSF spaces
are normal. There is no acute or chronic infarction. The brain
parenchyma is normal.

Vascular: No abnormal hyperdensity of the major intracranial
arteries or dural venous sinuses. No intracranial atherosclerosis.

Skull: The visualized skull base, calvarium and extracranial soft
tissues are normal.

Sinuses/Orbits: No fluid levels or advanced mucosal thickening of
the visualized paranasal sinuses. No mastoid or middle ear effusion.
The orbits are normal.

Other: None
IMPRESSION: Normal head CT.

## 2019-07-29 IMAGING — CR DG KNEE COMPLETE 4+V*R*
4 series · 4 of 4 positions shown · non-contrast
Comparison: 04/29/2018

CLINICAL DATA: Fall this morning. Medial knee pain. Initial
encounter.

EXAM:
RIGHT KNEE - COMPLETE 4+ VIEW

[knee ap]
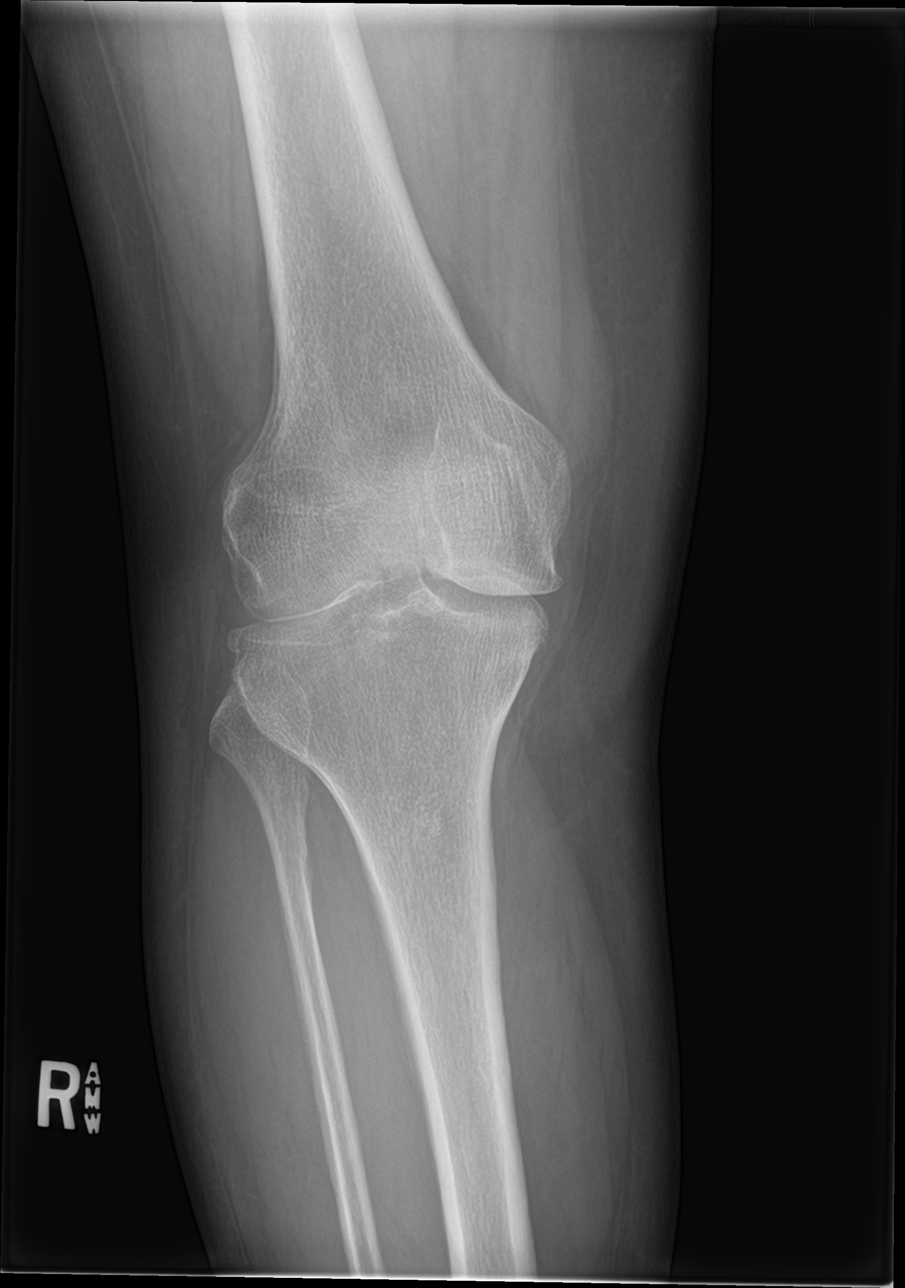

[knee obl (1 of 2)]
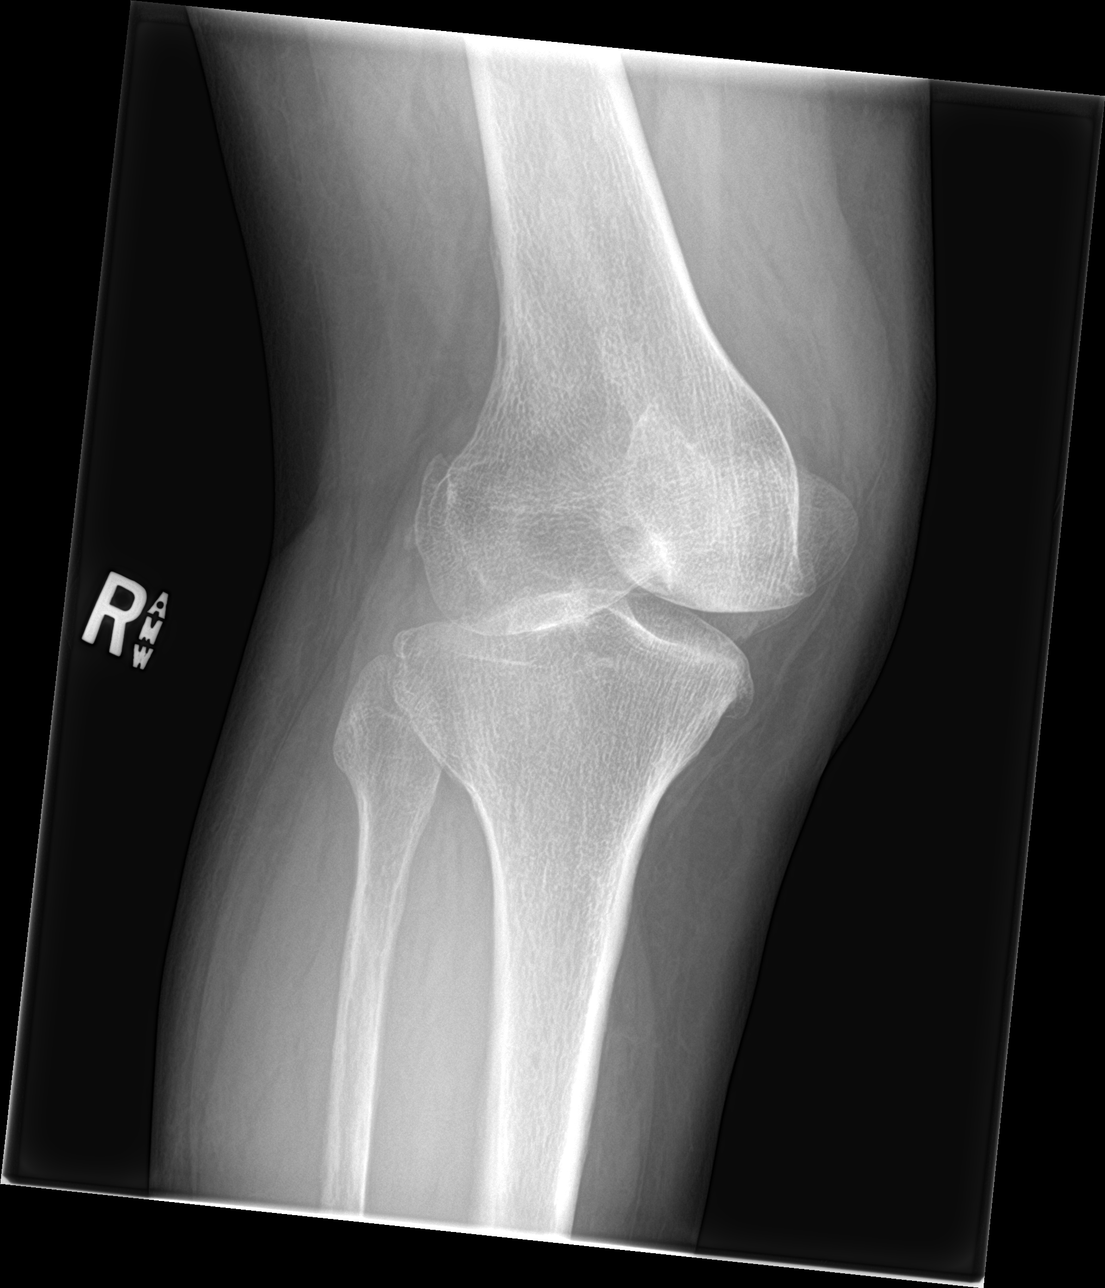

[knee obl (2 of 2)]
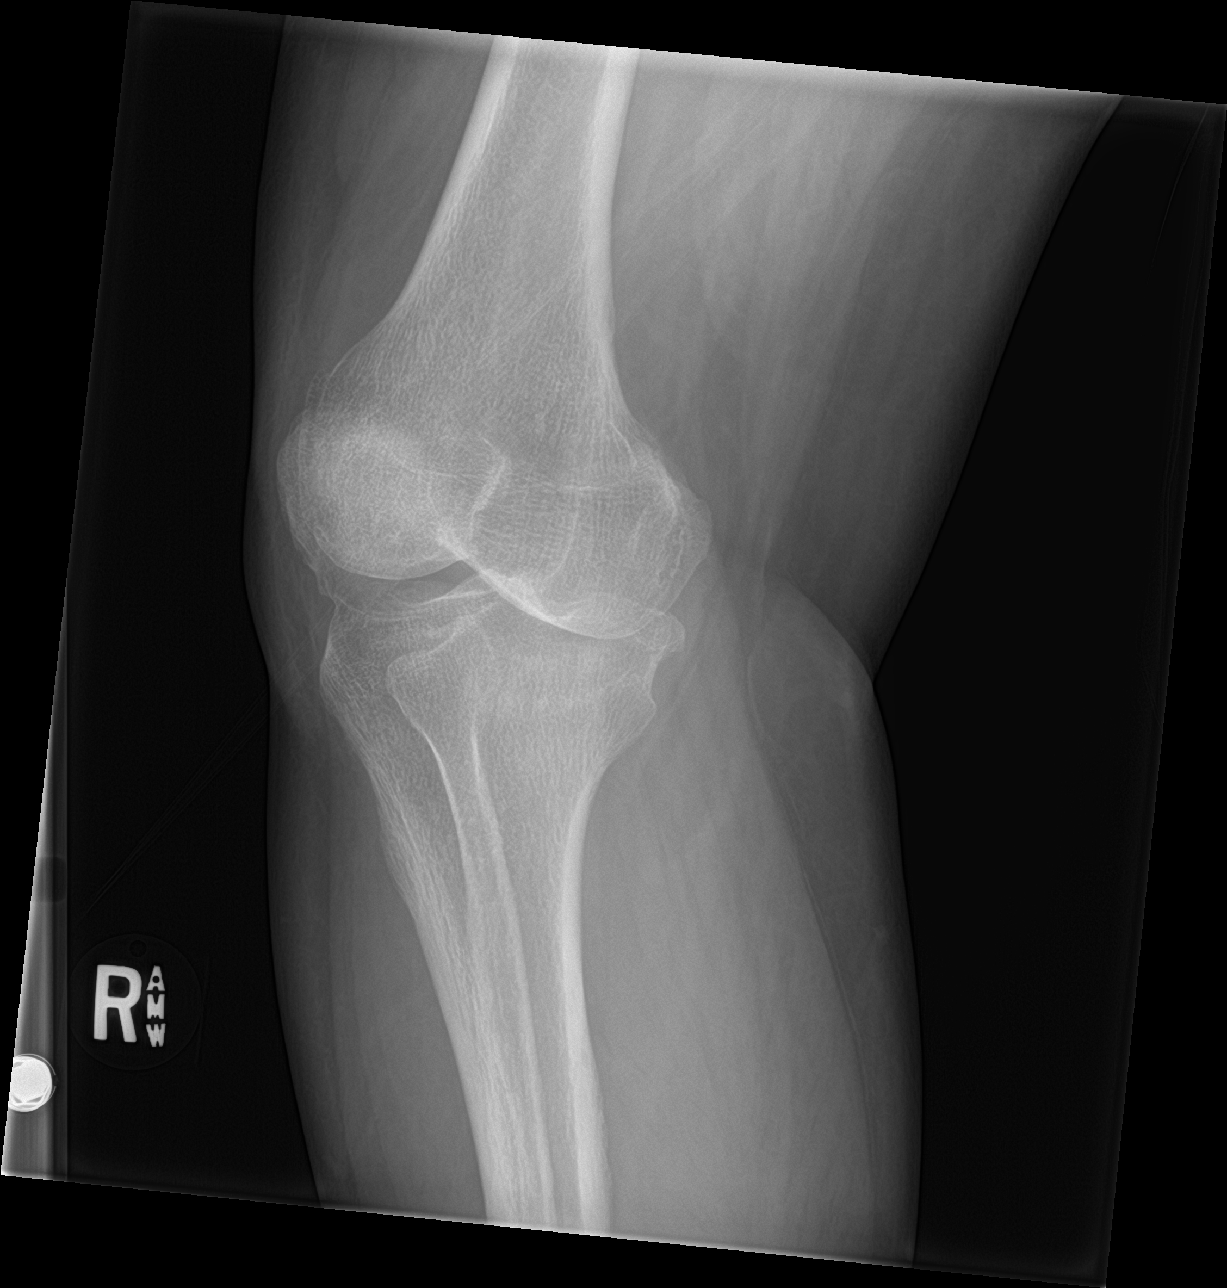

[knee lat]
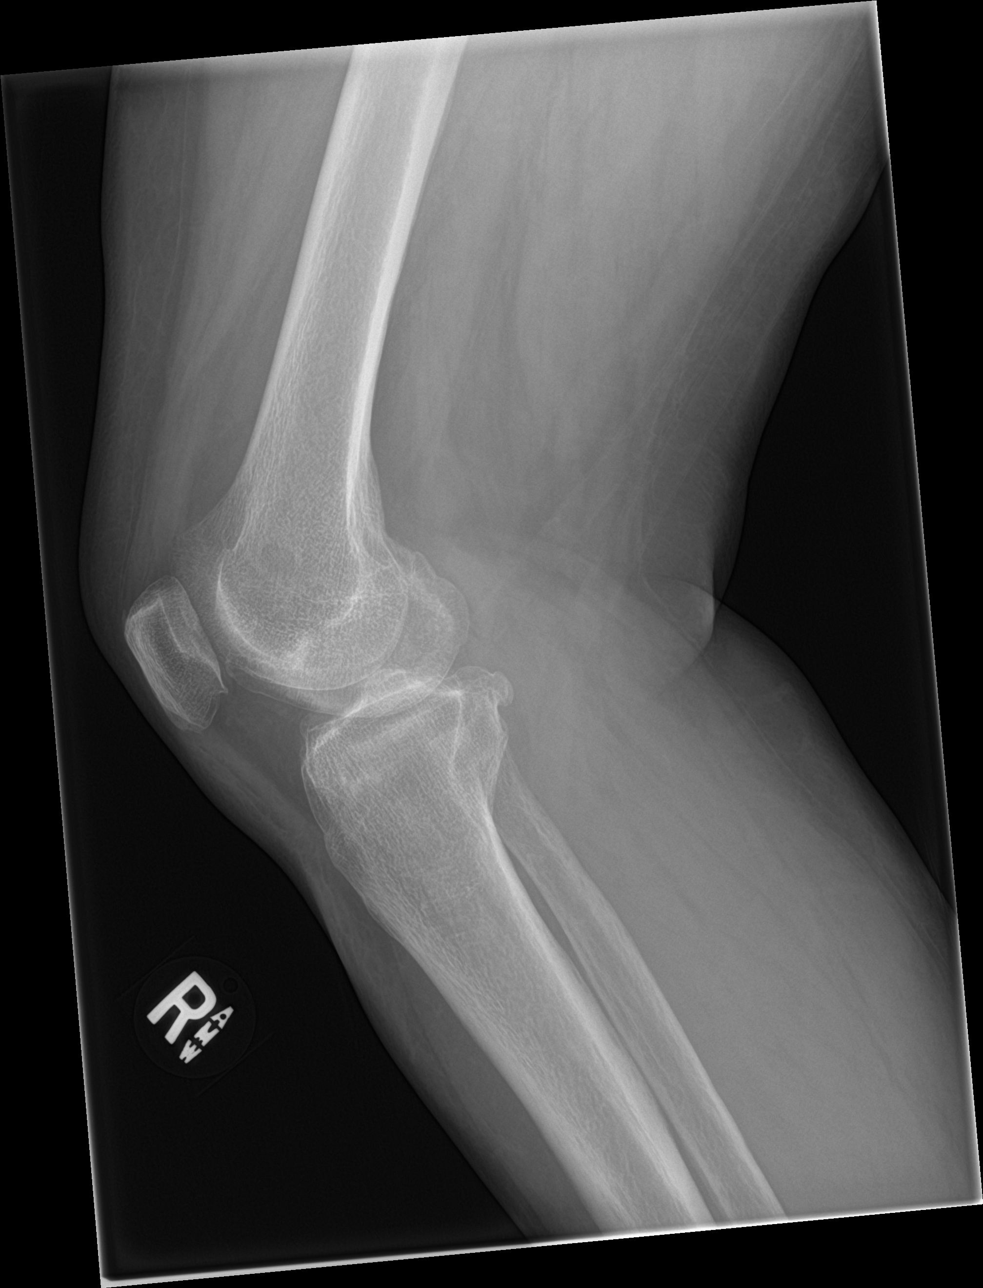

[4 of 4 positions shown; findings below may reference images not displayed]

FINDINGS: No evidence of fracture, dislocation, or joint effusion. Mild medial
compartment osteoarthritis is seen, without significant change. Soft
tissues are unremarkable.
IMPRESSION: No acute findings.

Mild medial compartment osteoarthritis.

## 2019-08-10 IMAGING — CT CT RENAL STONE PROTOCOL
2 of 4 series · 16 of 46 positions shown, 18 images · non-contrast
Comparison: 01/14/2018

CLINICAL DATA: Right-sided flank pain beginning yesterday.

EXAM:
CT ABDOMEN AND PELVIS WITHOUT CONTRAST
TECHNIQUE: Multidetector CT imaging of the abdomen and pelvis was performed
following the standard protocol without IV contrast.

[Series 2: stone full standard · axial · 0.76mm/px · z∈[-1025,-635]mm · 13 of 88 slices shown, 15 images]
[im 5/88  soft-tissue]
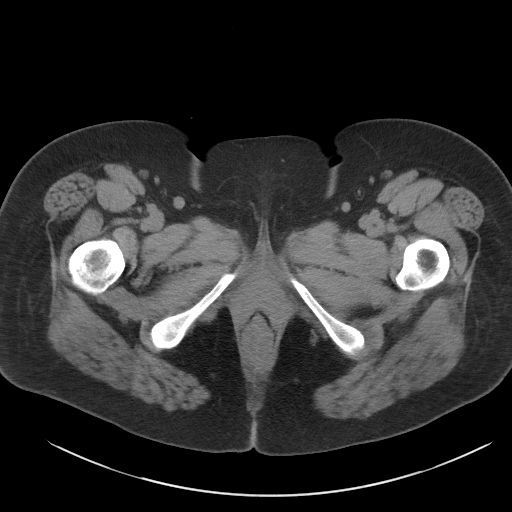
[im 5/88  bone]
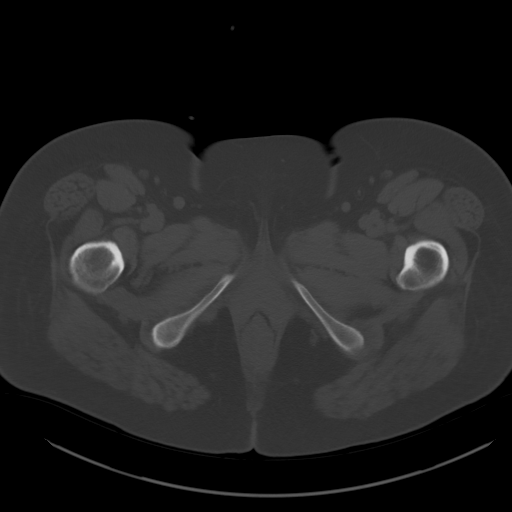
[im 13/88  soft-tissue]
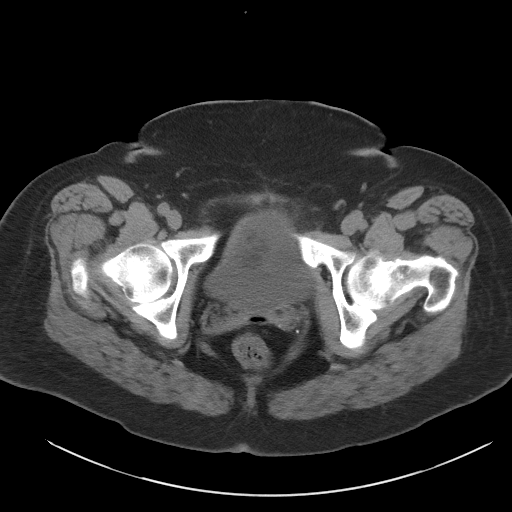
[im 17/88  soft-tissue]
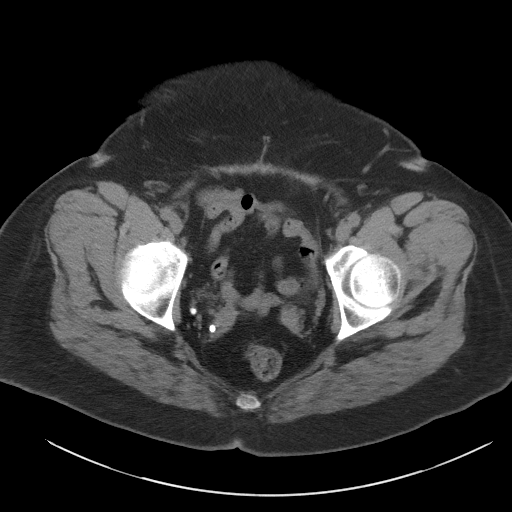
[im 25/88  soft-tissue]
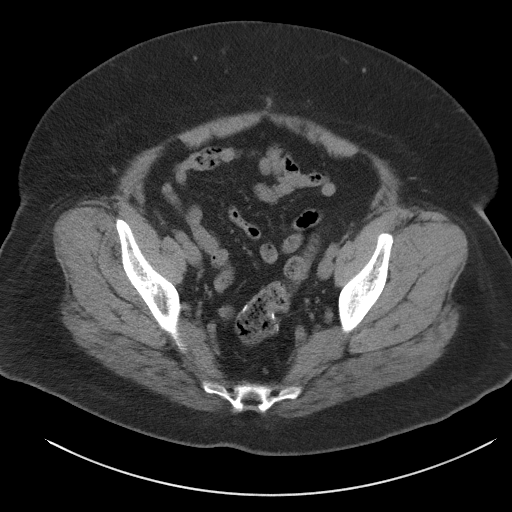
[im 30/88  soft-tissue]
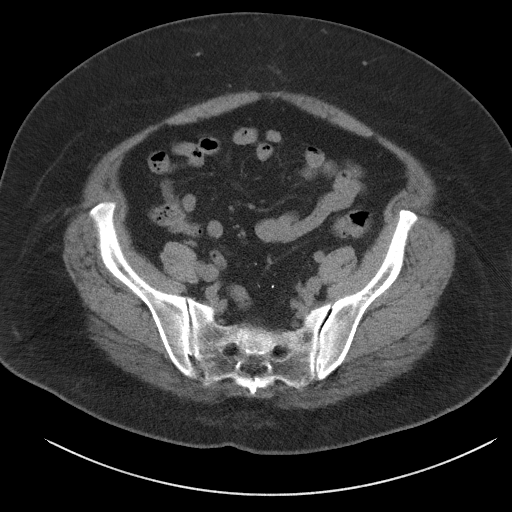
[im 38/88  soft-tissue]
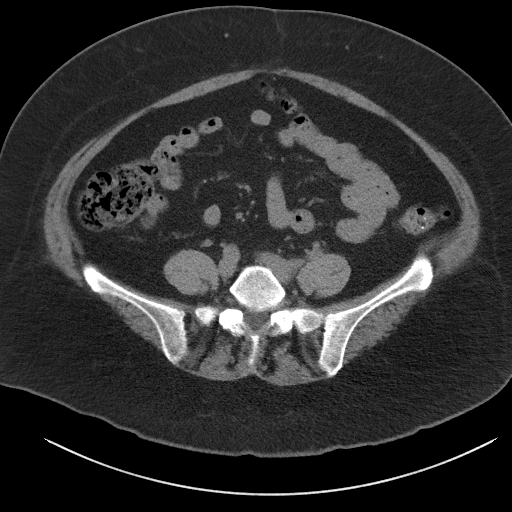
[im 46/88  soft-tissue]
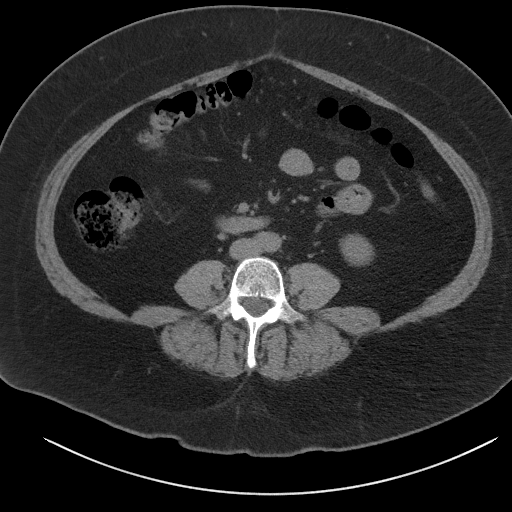
[im 50/88  soft-tissue]
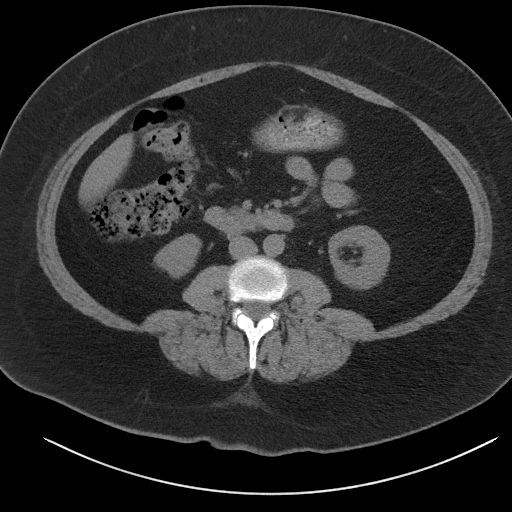
[im 59/88  soft-tissue]
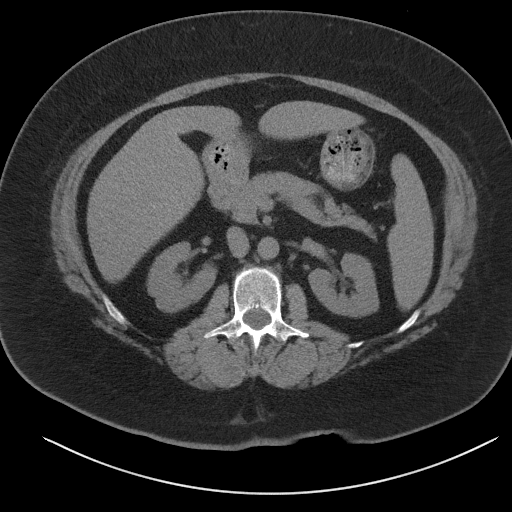
[im 59/88  bone]
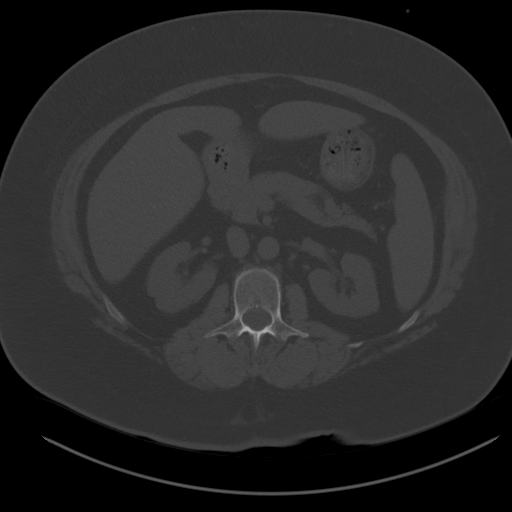
[im 63/88  soft-tissue]
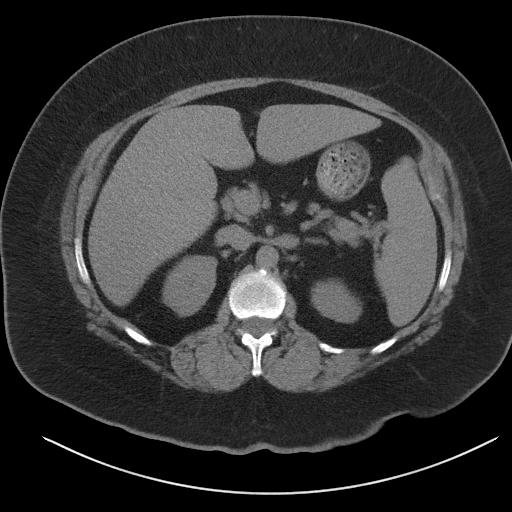
[im 71/88  soft-tissue]
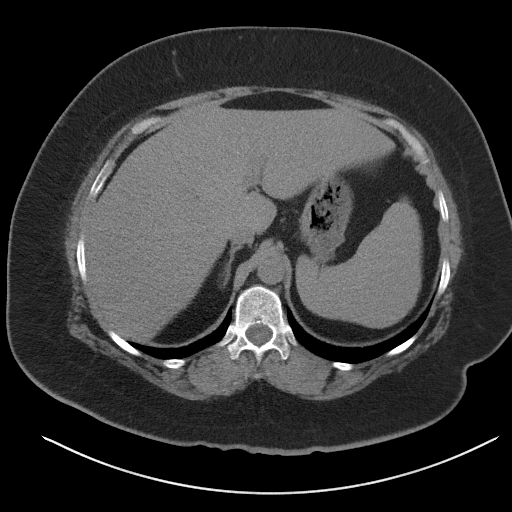
[im 75/88  soft-tissue]
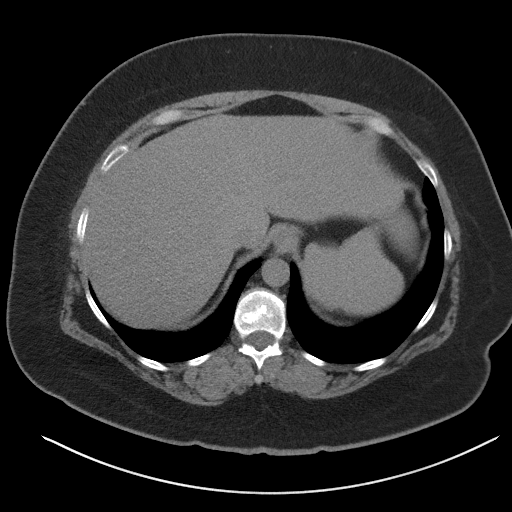
[im 83/88  soft-tissue]
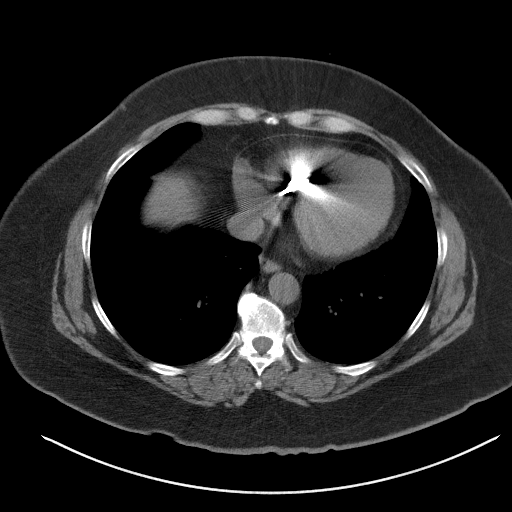

[Series 6: coronal · coronal · 0.74mm/px · 3 of 140 slices shown]
[im 47/140  soft-tissue]
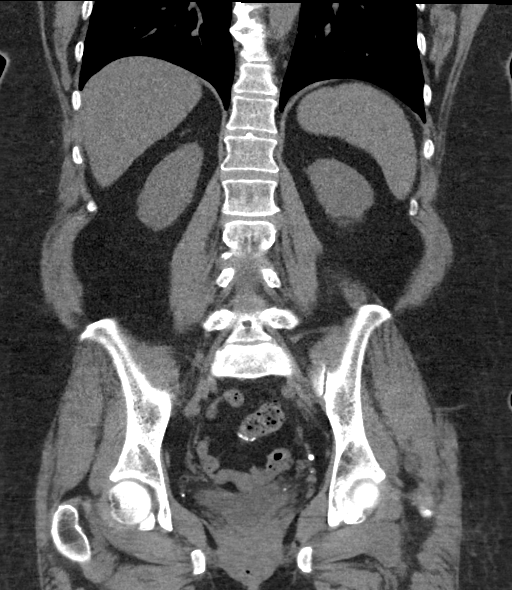
[im 62/140  soft-tissue]
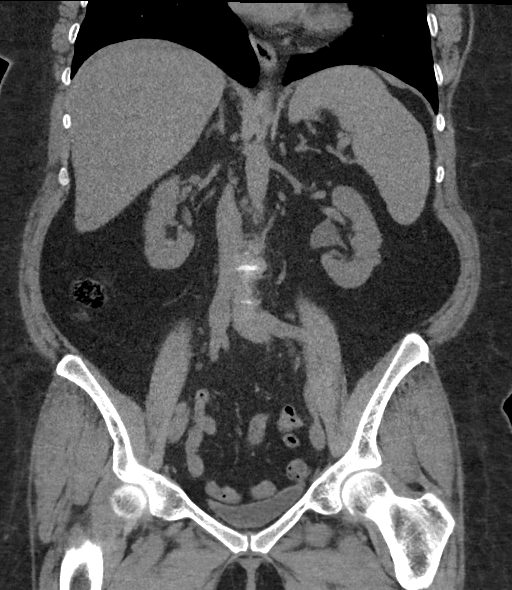
[im 78/140  soft-tissue]
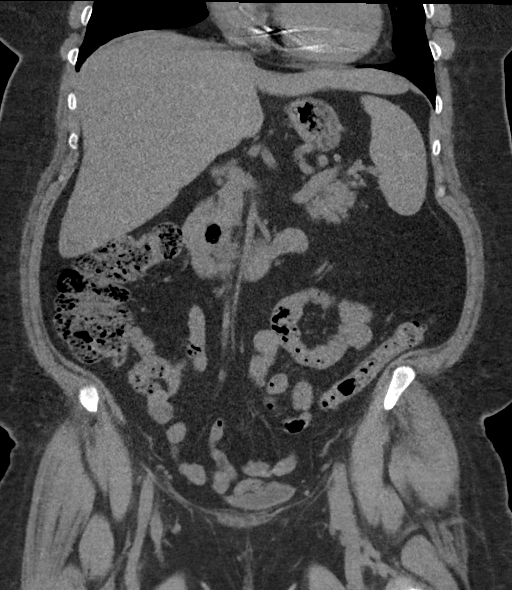

[16 of 46 positions shown; findings below may reference images not displayed]

FINDINGS: Lower chest: The lung bases are clear.

Hepatobiliary: No focal liver abnormality is seen. Status post
cholecystectomy. No biliary dilatation.

Pancreas: Unremarkable. No pancreatic ductal dilatation or
surrounding inflammatory changes.

Spleen: Normal in size without focal abnormality.

Adrenals/Urinary Tract: Adrenal glands are unremarkable. Kidneys are
normal, without renal calculi, focal lesion, or hydronephrosis.
Bladder is unremarkable.

Stomach/Bowel: Stomach, small bowel, and colon are not abnormally
distended. Small diverticulum in the second portion of the duodenum.
No wall thickening or inflammatory changes appreciated. Surgical
anastomosis in the sigmoid region with surgical clips in the
mesentery. Appendix is not identified.

Vascular/Lymphatic: No significant vascular findings are present. No
enlarged abdominal or pelvic lymph nodes.

Reproductive: Status post hysterectomy. No adnexal masses.

Other: No abdominal wall hernia or abnormality. No abdominopelvic
ascites.

Musculoskeletal: No acute or significant osseous findings.
IMPRESSION: 1. No acute process demonstrated in the abdomen or pelvis.
2. No evidence of renal or ureteral stone or obstruction.
3. No evidence of bowel obstruction or inflammation. Small
diverticulum in the second portion of the duodenum.
4. Status post cholecystectomy and hysterectomy.

## 2019-08-10 IMAGING — CR DG CHEST 2V
2 series · 2 of 2 positions shown · non-contrast
Comparison: 05/04/2018

CLINICAL DATA: Cough and right rib pain.

EXAM:
CHEST - 2 VIEW

[chest pa]
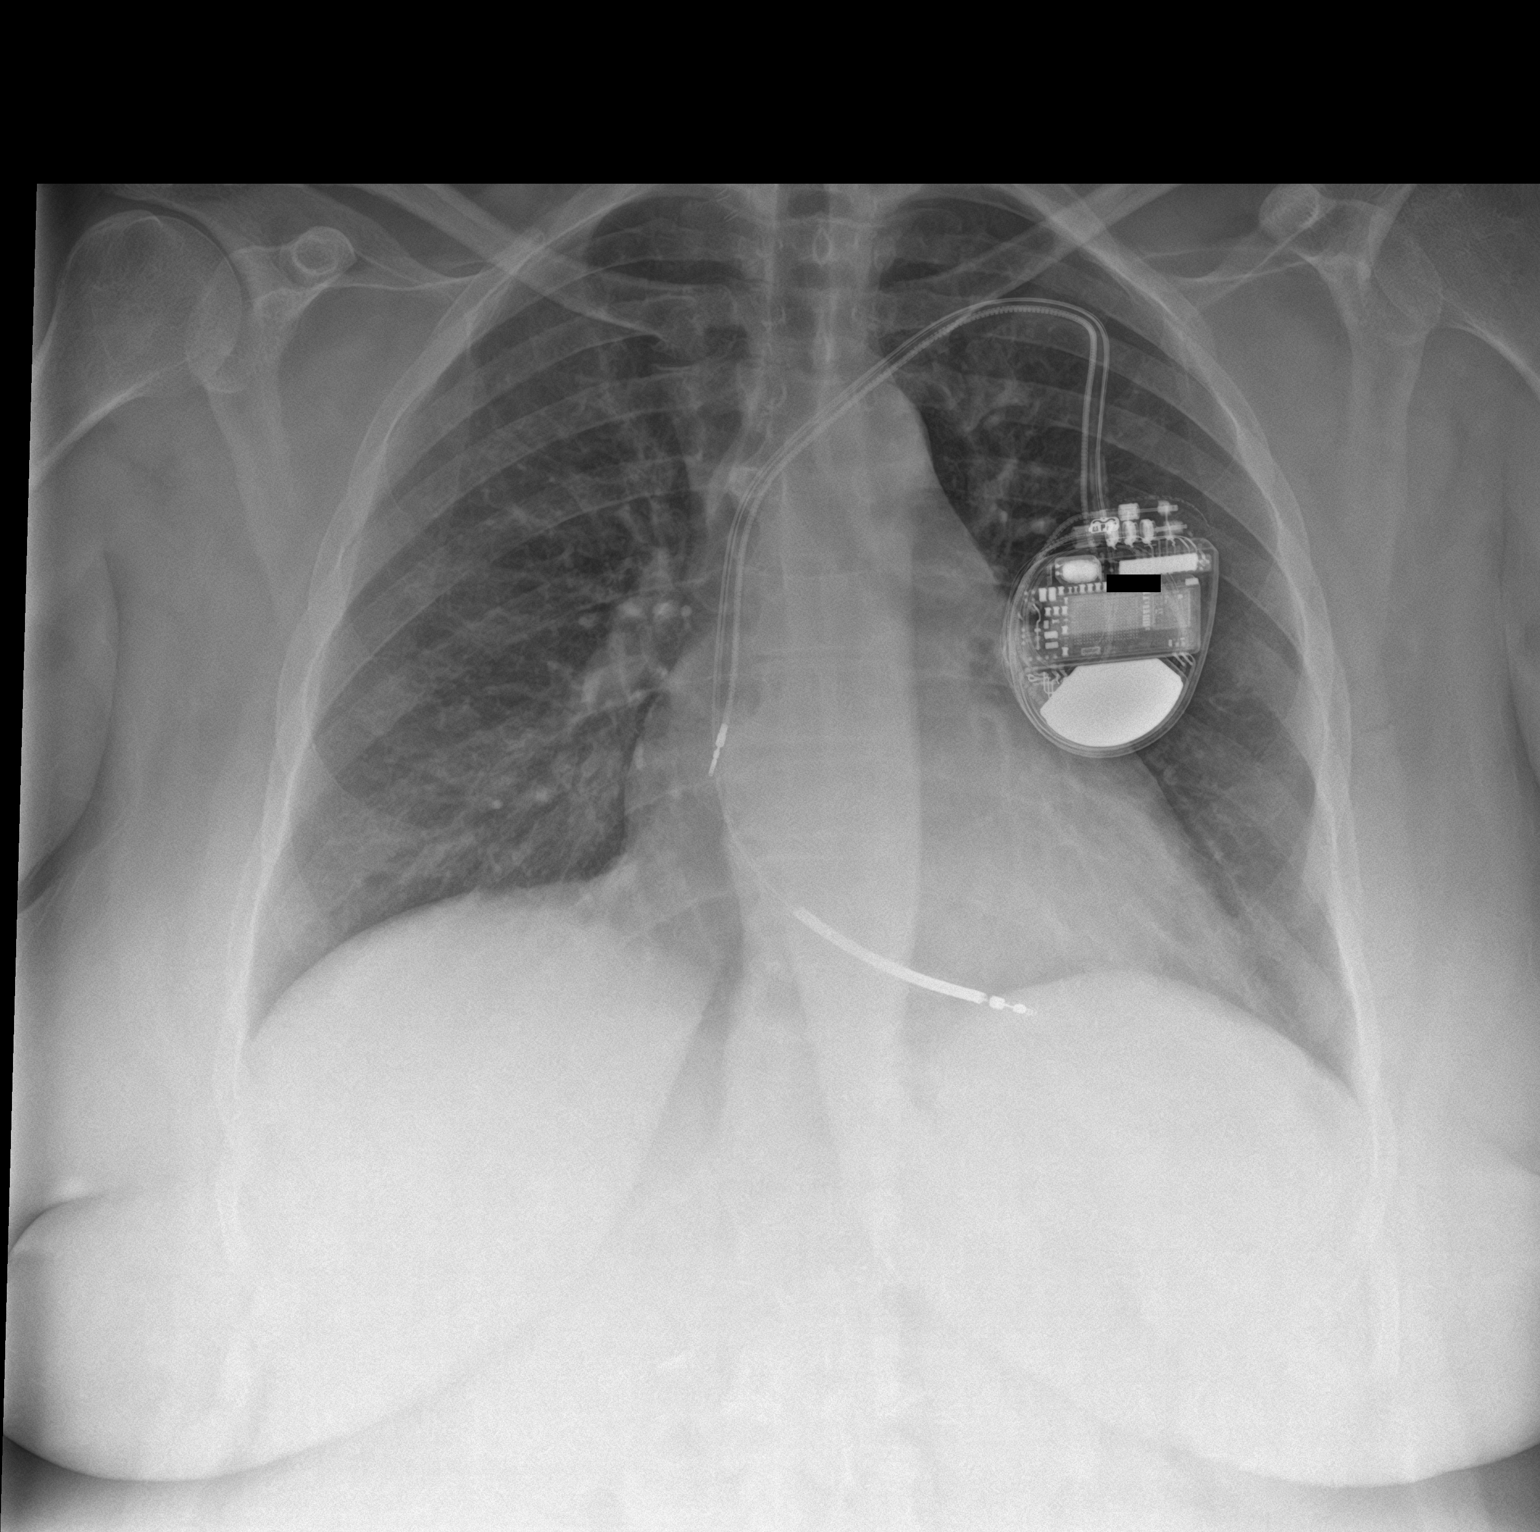

[chest lat]
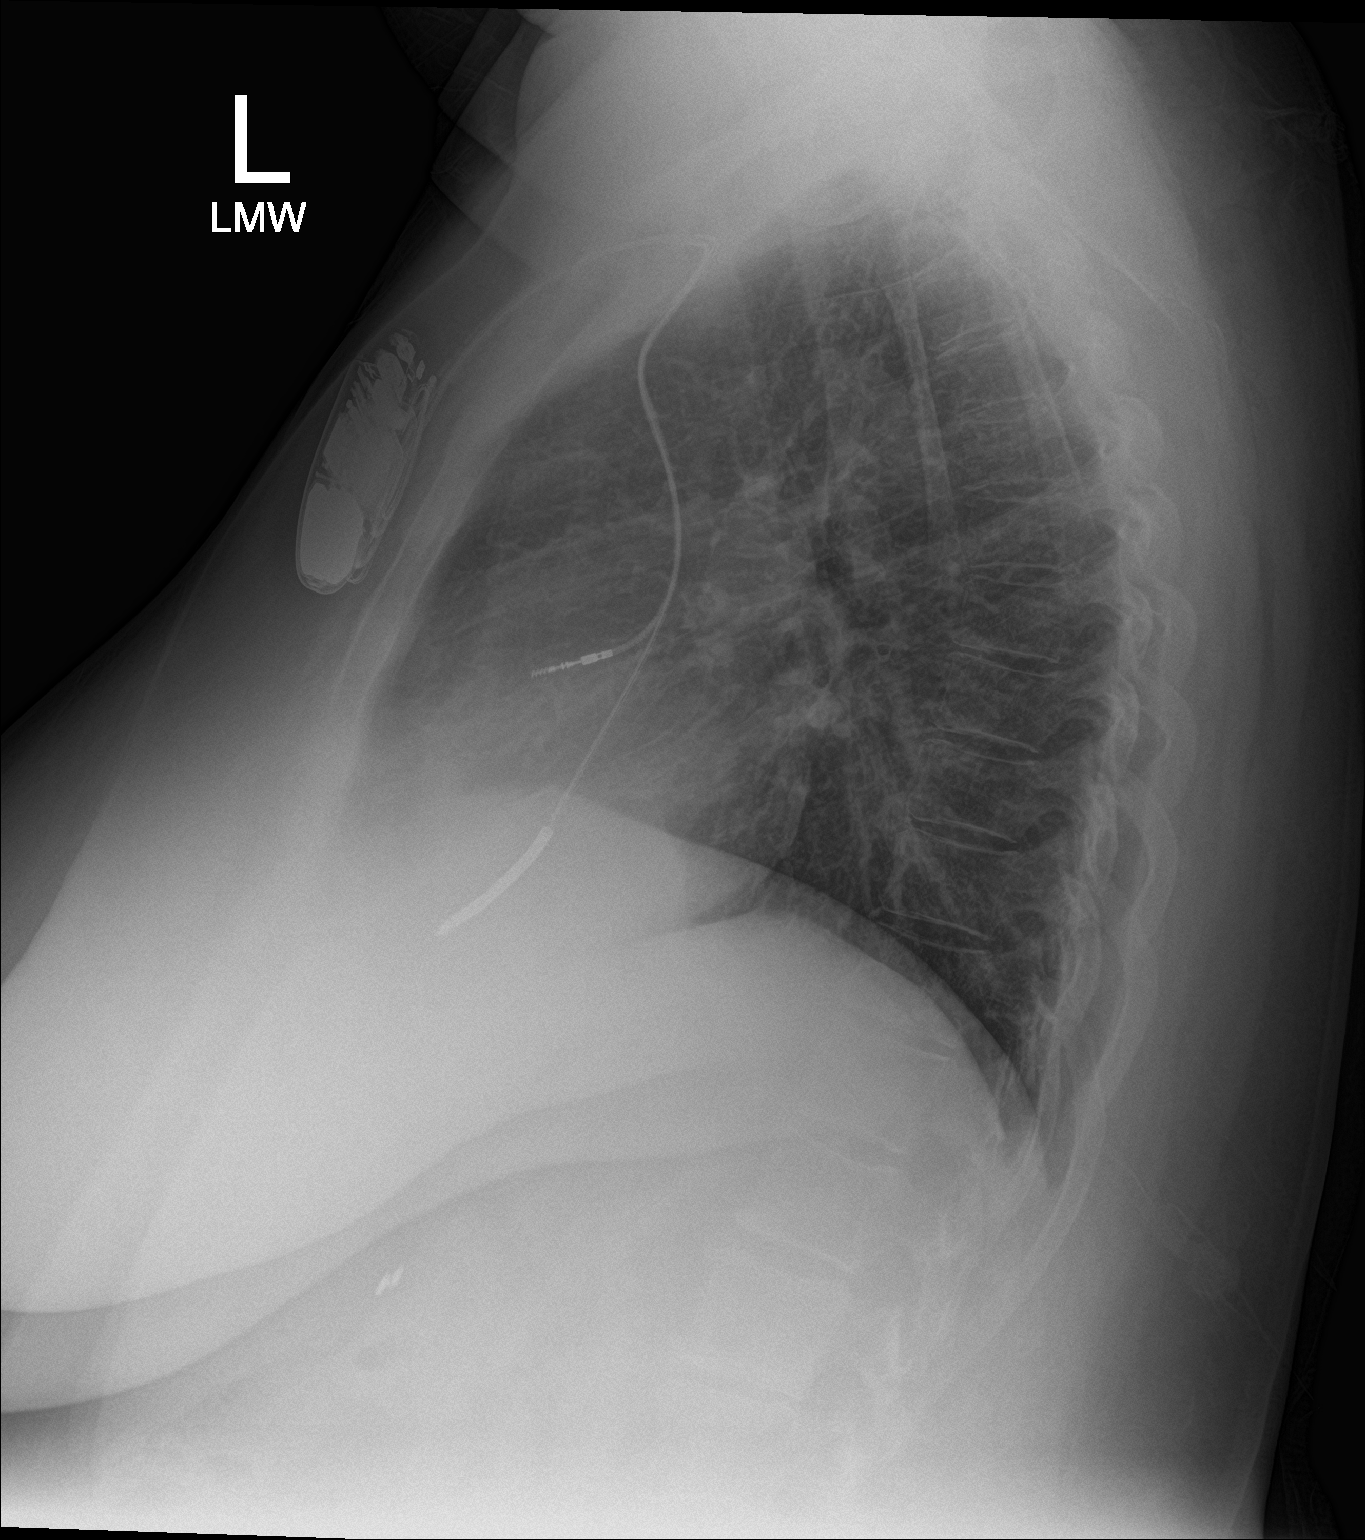

[2 of 2 positions shown; findings below may reference images not displayed]

FINDINGS: Cardiac pacemaker. Borderline heart size. No pulmonary vascular
congestion, edema, or consolidation. No blunting of costophrenic
angles. No pneumothorax. Mediastinal contours appear intact.
Surgical clips in the right upper quadrant.
IMPRESSION: No active cardiopulmonary disease.

## 2019-09-07 IMAGING — MR MR HEAD W/O CM
11 series · 48 of 48 positions shown · non-contrast
Comparison: Head CT 05/04/2018

CLINICAL DATA: Intractable, chronic tension headache

EXAM:
MRI HEAD WITHOUT CONTRAST
TECHNIQUE: Multiplanar, multiecho pulse sequences of the brain and surrounding
structures were obtained without intravenous contrast.

[Series 5: DWI · axial · 4.0mm · 0.88mm/px · z∈[-73,+75]mm · 7 of 76 slices shown (1 of 4)]
[im 1/76]
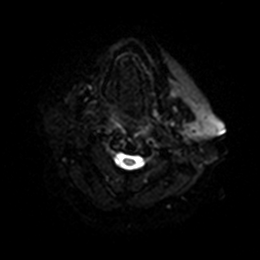
[im 13/76]
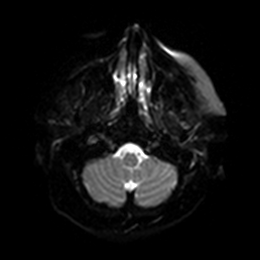
[im 26/76]
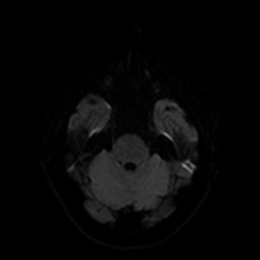
[im 38/76]
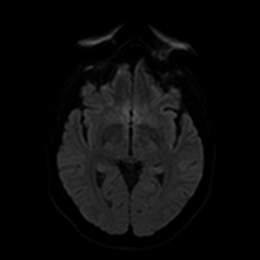
[im 51/76]
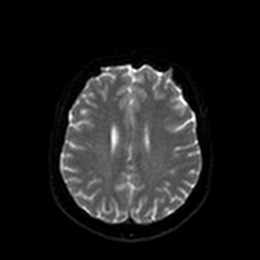
[im 63/76]
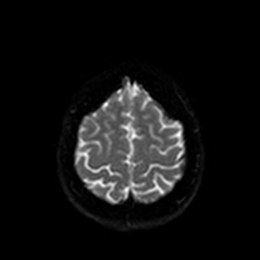
[im 76/76]
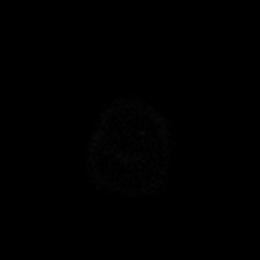

[Series 6: DWI · axial · 4.0mm · 0.88mm/px · z∈[-73,+75]mm · 3 of 38 slices shown (2 of 4)]
[im 1/38]
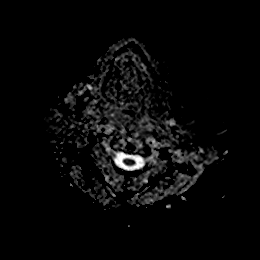
[im 19/38]
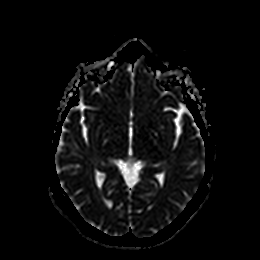
[im 38/38]
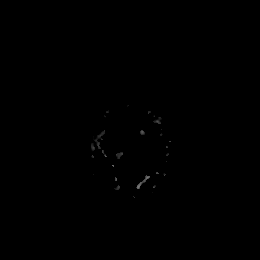

[Series 7: DWI · coronal · 4.0mm · 0.88mm/px · 8 of 72 slices shown (3 of 4)]
[im 1/72]
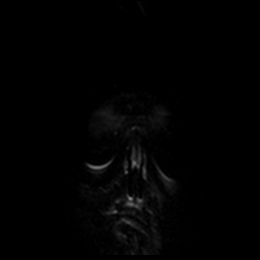
[im 11/72]
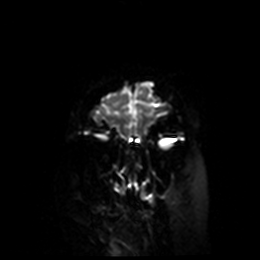
[im 21/72]
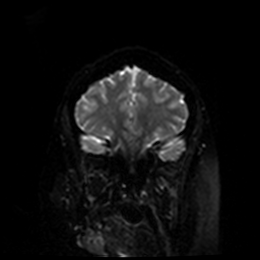
[im 31/72]
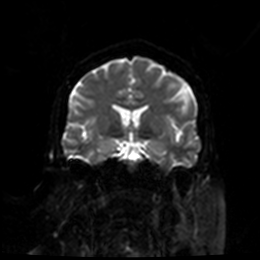
[im 41/72]
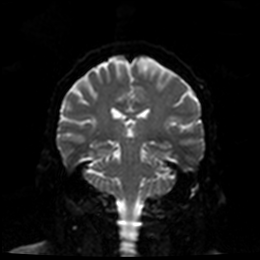
[im 51/72]
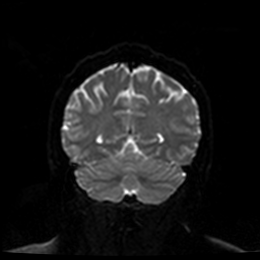
[im 61/72]
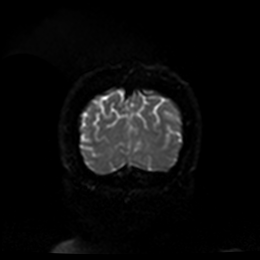
[im 72/72]
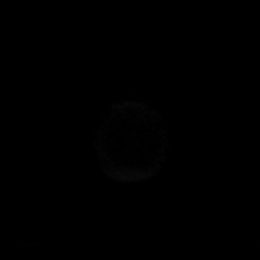

[Series 8: DWI · coronal · 4.0mm · 0.88mm/px · 4 of 36 slices shown (4 of 4)]
[im 1/36]
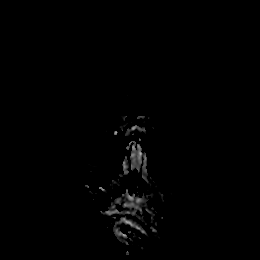
[im 12/36]
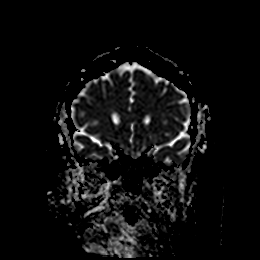
[im 24/36]
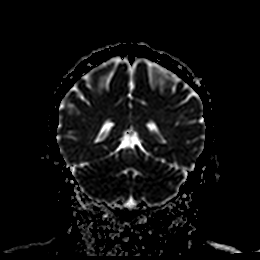
[im 36/36]
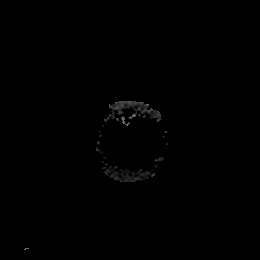

[Series 10: T2 · axial · 5.0mm · 0.72mm/px · z∈[-78,+66]mm · 3 of 25 slices shown (1 of 2)]
[im 1/25]
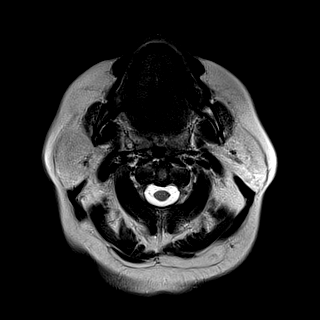
[im 13/25]
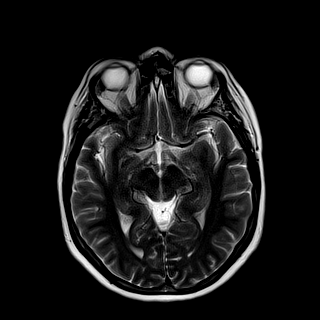
[im 25/25]
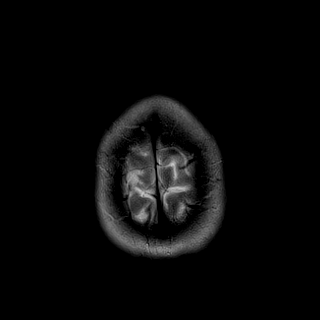

[Series 11: swi_images · axial · 3.0mm · 0.90mm/px · z∈[-83,+94]mm · 6 of 60 slices shown]
[im 1/60]
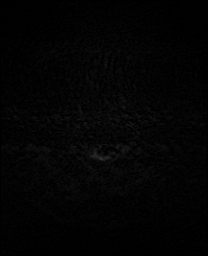
[im 12/60]
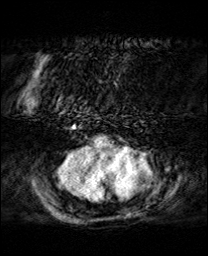
[im 24/60]
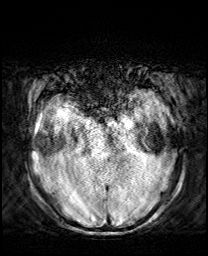
[im 36/60]
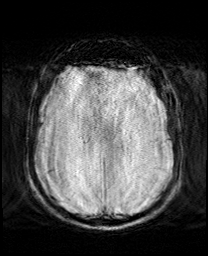
[im 48/60]
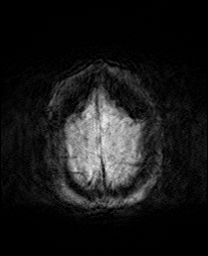
[im 60/60]
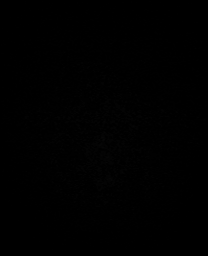

[Series 12: mip_images(sw) · axial · 24.0mm · 0.90mm/px · z∈[-72,+83]mm · 6 of 53 slices shown]
[im 1/53]
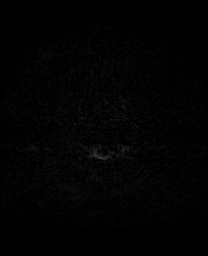
[im 11/53]
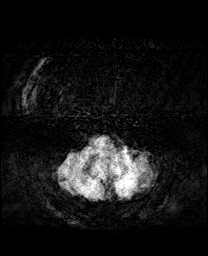
[im 21/53]
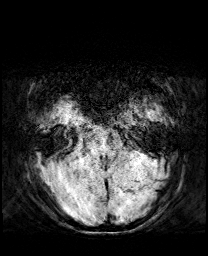
[im 32/53]
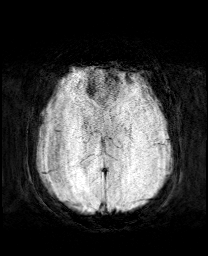
[im 42/53]
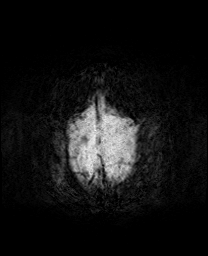
[im 53/53]
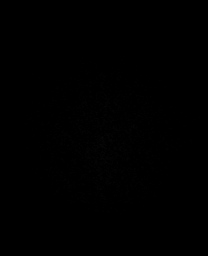

[Series 14: FLAIR · axial · 5.0mm · 0.45mm/px · z∈[-77,+67]mm · 3 of 25 slices shown (1 of 2)]
[im 1/25]
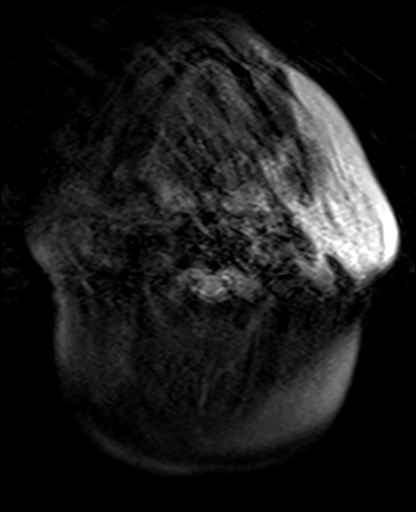
[im 13/25]
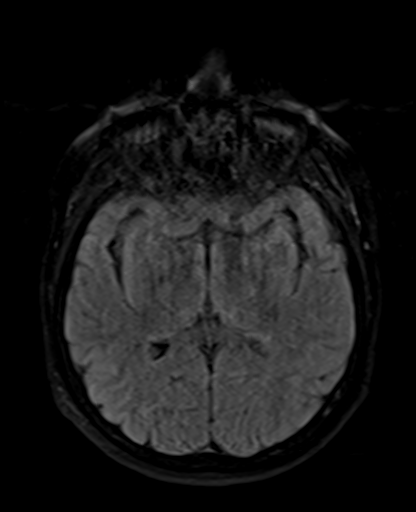
[im 25/25]
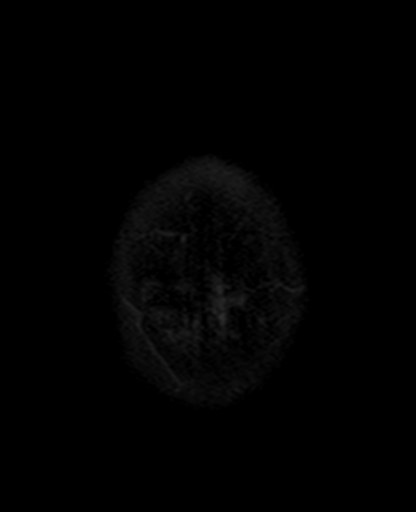

[Series 15: T2 · coronal · 5.0mm · 0.72mm/px · 3 of 25 slices shown (2 of 2)]
[im 1/25]
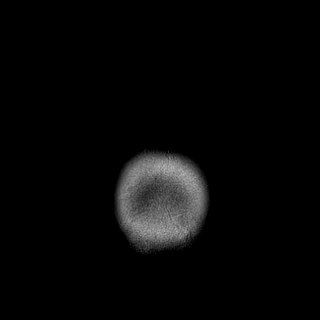
[im 13/25]
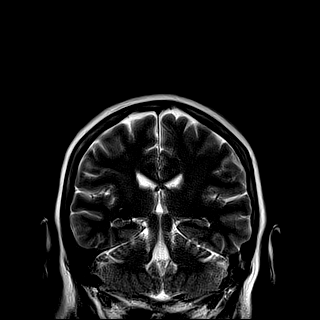
[im 25/25]
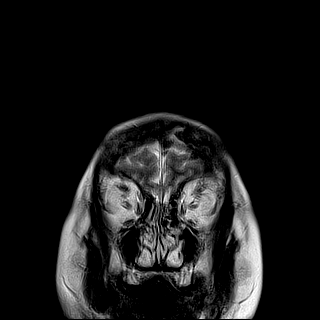

[Series 16: FLAIR · axial · 5.0mm · 0.40mm/px · z∈[-67,+77]mm · 3 of 25 slices shown (2 of 2)]
[im 1/25]
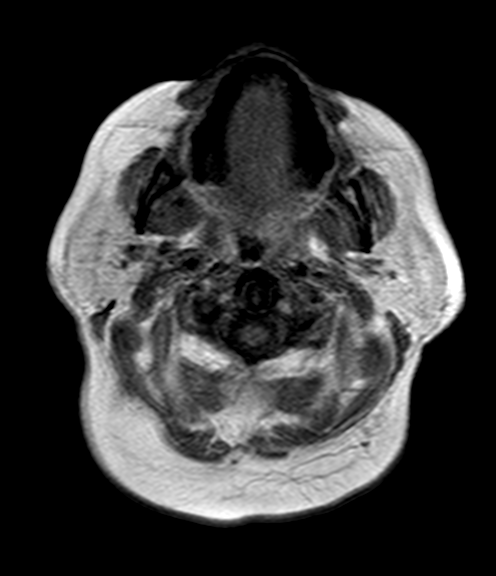
[im 13/25]
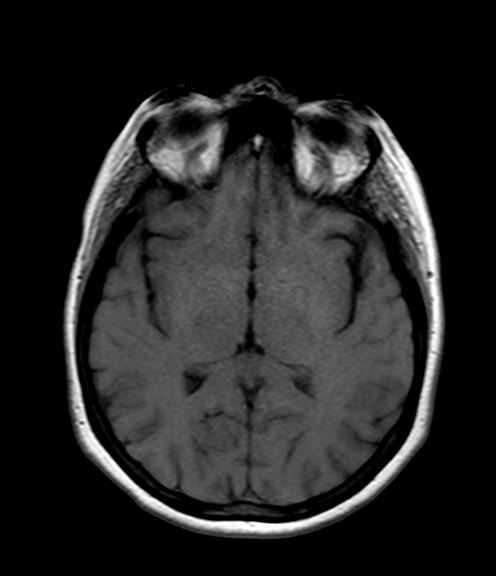
[im 25/25]
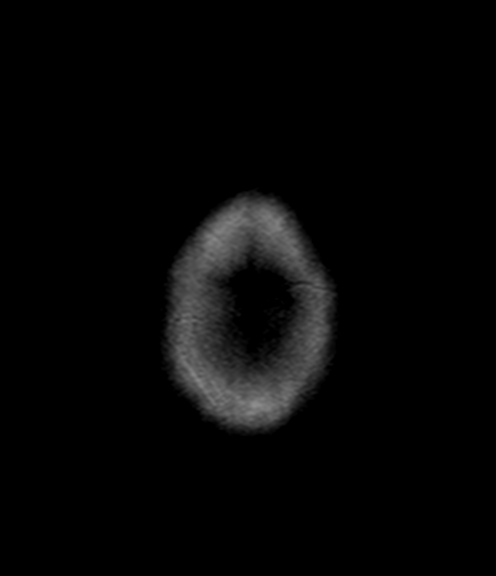

[Series 17: T1 · sagittal · 5.0mm · 0.90mm/px · 2 of 20 slices shown]
[im 1/20]
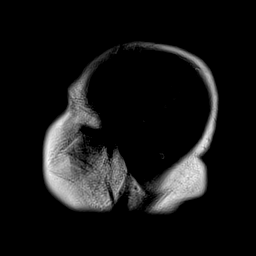
[im 20/20]
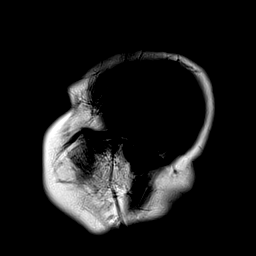

[48 of 48 positions shown; findings below may reference images not displayed]

FINDINGS: Brain: No acute infarction, hemorrhage, hydrocephalus, extra-axial
collection or mass lesion. No evidence of white matter disease or
atrophy.

Vascular: Major flow voids are preserved.

Skull and upper cervical spine: No evidence of marrow lesion

Sinuses/Orbits: Minimal left mastoid opacification, considered
incidental.

Other: Progressively motion degraded study to a degree that findings
could be obscured.
IMPRESSION: Negative brain MRI with intermittent moderate to advanced motion
artifact.

## 2019-09-09 IMAGING — CR DG CHEST 2V
2 series · 2 of 2 positions shown · non-contrast
Comparison: PA and lateral chest 05/25/2018 and 07/03/2017.

CLINICAL DATA: Lower leg pain today.

EXAM:
CHEST - 2 VIEW

[chest pa]
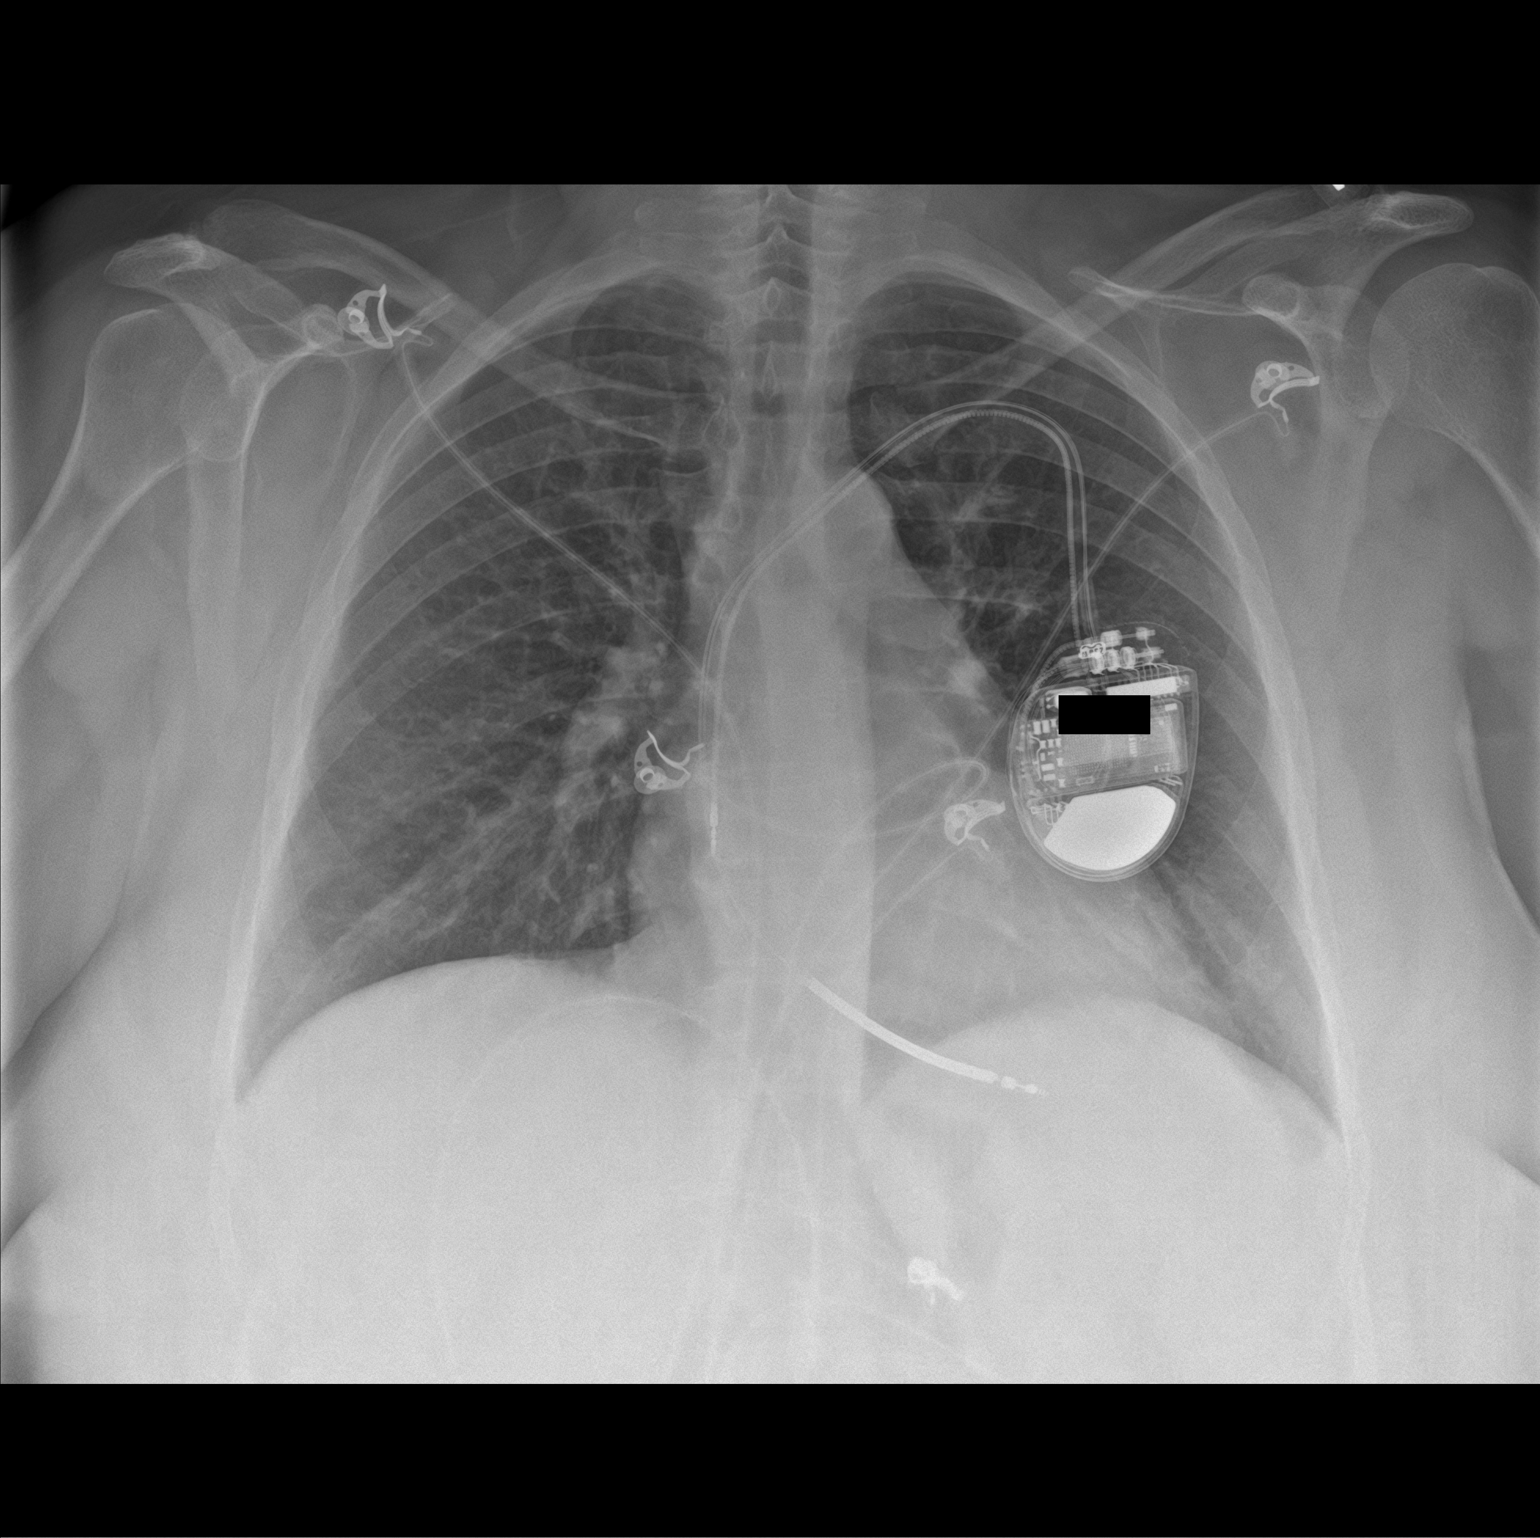

[chest lat]
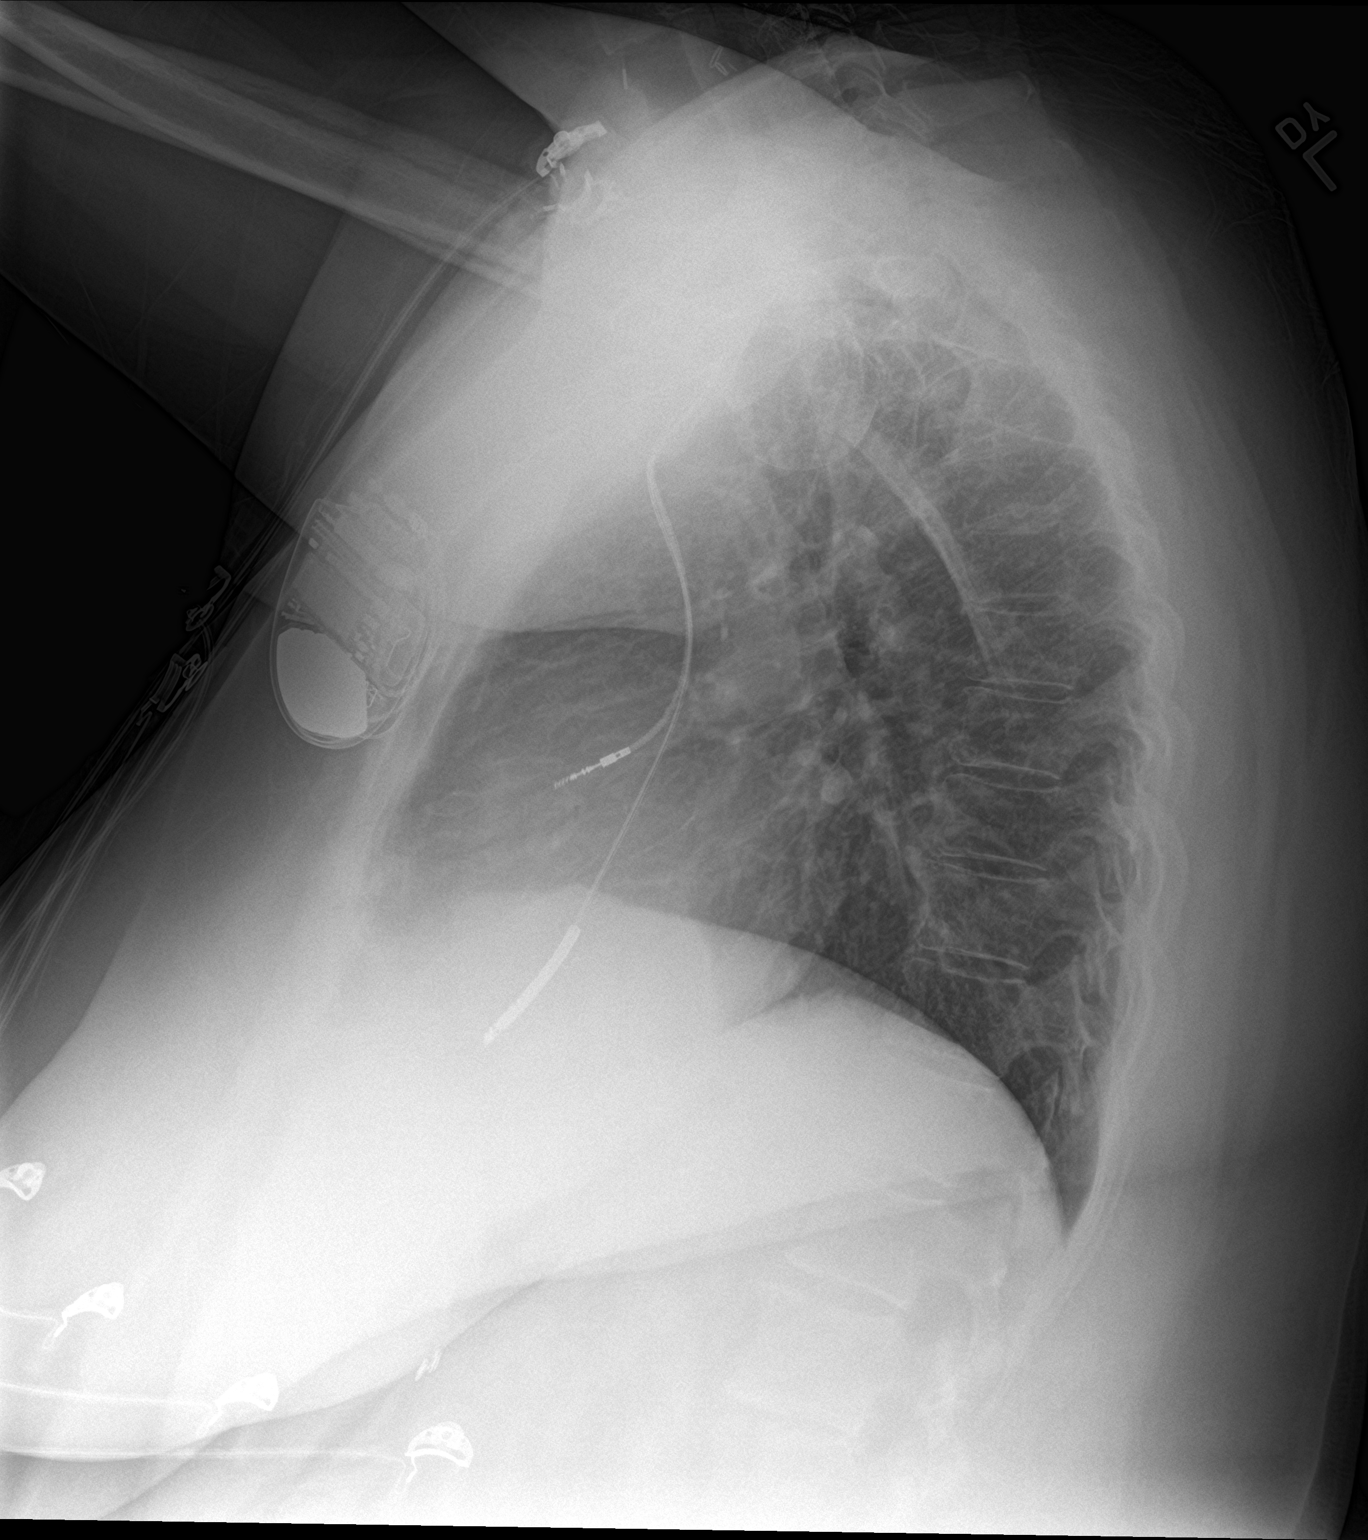

[2 of 2 positions shown; findings below may reference images not displayed]

FINDINGS: AICD is unchanged. Lungs clear. Heart size normal. Aortic
atherosclerosis noted. No pneumothorax or pleural fluid. No acute or
focal bony abnormality.
IMPRESSION: No acute disease.

Atherosclerosis.

## 2019-09-10 ENCOUNTER — Other Ambulatory Visit: Payer: Self-pay

## 2019-09-10 ENCOUNTER — Emergency Department
Admission: EM | Admit: 2019-09-10 | Discharge: 2019-09-10 | Disposition: A | Discharge status: Home / Self Care | Payer: Medicare Other | Attending: Emergency Medicine | Admitting: Emergency Medicine

## 2019-09-10 ENCOUNTER — Encounter: Payer: Self-pay | Admitting: Emergency Medicine

## 2019-09-10 DIAGNOSIS — R112 Nausea with vomiting, unspecified: Secondary | ICD-10-CM | POA: Insufficient documentation

## 2019-09-10 DIAGNOSIS — Z20822 Contact with and (suspected) exposure to covid-19: Secondary | ICD-10-CM | POA: Insufficient documentation

## 2019-09-10 DIAGNOSIS — J45909 Unspecified asthma, uncomplicated: Secondary | ICD-10-CM | POA: Insufficient documentation

## 2019-09-10 DIAGNOSIS — I5022 Chronic systolic (congestive) heart failure: Secondary | ICD-10-CM | POA: Diagnosis not present

## 2019-09-10 DIAGNOSIS — J449 Chronic obstructive pulmonary disease, unspecified: Secondary | ICD-10-CM | POA: Insufficient documentation

## 2019-09-10 DIAGNOSIS — R197 Diarrhea, unspecified: Secondary | ICD-10-CM | POA: Diagnosis not present

## 2019-09-10 DIAGNOSIS — Z79899 Other long term (current) drug therapy: Secondary | ICD-10-CM | POA: Diagnosis not present

## 2019-09-10 LAB — COMPREHENSIVE METABOLIC PANEL
ALT: 19 U/L (ref 0–44)
AST: 22 U/L (ref 15–41)
Albumin: 3.9 g/dL (ref 3.5–5.0)
Alkaline Phosphatase: 84 U/L (ref 38–126)
Anion gap: 11 (ref 5–15)
BUN: 11 mg/dL (ref 6–20)
CO2: 31 mmol/L (ref 22–32)
Calcium: 8.6 mg/dL — ABNORMAL LOW (ref 8.9–10.3)
Chloride: 97 mmol/L — ABNORMAL LOW (ref 98–111)
Creatinine, Ser: 0.95 mg/dL (ref 0.44–1.00)
GFR calc Af Amer: >60 mL/min (ref >60)
GFR calc non Af Amer: >60 mL/min (ref >60)
Glucose, Bld: 101 mg/dL — ABNORMAL HIGH (ref 70–99)
Potassium: 3 mmol/L — ABNORMAL LOW (ref 3.5–5.1)
Sodium: 139 mmol/L (ref 135–145)
Total Bilirubin: 0.6 mg/dL (ref 0.3–1.2)
Total Protein: 7.6 g/dL (ref 6.5–8.1)

## 2019-09-10 LAB — CBC
HCT: 40.1 % (ref 36.0–46.0)
Hemoglobin: 13.7 g/dL (ref 12.0–15.0)
MCH: 30.2 pg (ref 26.0–34.0)
MCHC: 34.2 g/dL (ref 30.0–36.0)
MCV: 88.3 fL (ref 80.0–100.0)
Platelets: 274 10*3/uL (ref 150–400)
RBC: 4.54 MIL/uL (ref 3.87–5.11)
RDW: 13.5 % (ref 11.5–15.5)
WBC: 8.9 10*3/uL (ref 4.0–10.5)
nRBC: 0 % (ref 0.0–0.2)

## 2019-09-10 LAB — TROPONIN I (HIGH SENSITIVITY): Troponin I (High Sensitivity): <2 ng/L (ref <18)

## 2019-09-10 LAB — C DIFFICILE QUICK SCREEN W PCR REFLEX
C Diff antigen: NEGATIVE
C Diff interpretation: NOT DETECTED
C Diff toxin: NEGATIVE

## 2019-09-10 LAB — LIPASE, BLOOD: Lipase: 33 U/L (ref 11–51)

## 2019-09-10 MED ORDER — KETOROLAC TROMETHAMINE 30 MG/ML IJ SOLN
15.0000 mg | Freq: Once | INTRAMUSCULAR | Status: DC
  Filled 2019-09-10: qty 1

## 2019-09-10 MED ORDER — POTASSIUM CHLORIDE CRYS ER 20 MEQ PO TBCR
40.0000 meq | EXTENDED_RELEASE_TABLET | Freq: Once | ORAL | Status: DC
  Filled 2019-09-10: qty 2

## 2019-09-10 MED ORDER — SODIUM CHLORIDE 0.9% FLUSH: 3.0000 mL | Freq: Once | INTRAVENOUS | Status: DC

## 2019-09-10 MED ORDER — SODIUM CHLORIDE 0.9 % IV BOLUS: 1000.0000 mL | Freq: Once | INTRAVENOUS | Status: DC

## 2019-09-10 MED ORDER — ALUM & MAG HYDROXIDE-SIMETH 200-200-20 MG/5ML PO SUSP
30.0000 mL | Freq: Once | ORAL | Status: DC
  Filled 2019-09-10: qty 30

## 2019-09-10 MED ORDER — PROMETHAZINE HCL 25 MG/ML IJ SOLN
12.5000 mg | Freq: Once | INTRAMUSCULAR | Status: DC
  Filled 2019-09-10: qty 1

## 2019-09-10 NOTE — ED Triage Notes (Signed)
Pt here with N,V,D for the past 3 days now, states she is very weak, also c/o left arm pain in her arm pit area, has never had pain in that area before, denies cp. Pt states she is seeing a surgeon in 2 weeks for tumors in her stomach, biopsy showed cancer cells. NAD.

## 2019-09-10 NOTE — Discharge Instructions (Addendum)
Your GI panel and your coronavirus test is pending.  Stay quarantine at home for the next 24 hours.  Return to the ER if your symptoms are worsening and you would like additional treatment at that time.  Return to the ER for any other concerns

## 2019-09-10 NOTE — ED Triage Notes (Signed)
FIRST NURSE NOTE-here for NVD for 2-3 days.  Ambulatory. NAD.

## 2019-09-10 NOTE — ED Provider Notes (Addendum)
[facility] Emergency Department Provider Note  ____________________________________________   First MD Initiated Contact with Patient 09/10/19 1342     (approximate)  I have reviewed the triage vital signs and the nursing notes.   HISTORY  Chief Complaint Weakness, Emesis, Diarrhea, and Arm Pain    HPI Brittany Nguyen is a 54 y.o. female with systolic heart failure, iron deficiency, dual-chamber ICD, gastric carcinoid tumor who presents with weakness emesis and diarrhea.  Patient states she is had about 3 days of nausea, vomiting, diarrhea.  She is had previous symptoms before with her gastric carcinoid tumor but states these are more severe.  She states that her doctor told her to come here to get her labs checked to make sure she was not dehydrated.  She denies any severe abdominal pain.  A little bit of epigastric tenderness.  Does not feel like her prior diverticulitis.  She states that she is vomited 10 times nonbloody nonbilious as well as had 10 episodes of diarrhea nonbloody.  She is been taking Phenergan at home which helps somewhat.  Denies anything making it worse.  Her symptoms are moderate.          Past Medical History:  Diagnosis Date  . Asthma 2013  . 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 CHF (congestive heart failure) ([place])   . COPD (chronic obstructive pulmonary disease) ([place])   . Diverticulitis 2010  . DVT (deep venous thrombosis) ([place])   . Endometriosis 1990  . Heart disease 2013  . Hx MRSA infection   . Iron deficiency   . Lump or mass in breast   . Restless leg   . Stomach cancer [facility])     Patient Active Problem List   Diagnosis Date Noted  . Iron deficiency anemia 03/27/2018  . Anemia 03/26/2018  . Abdominal pain 02/20/2018  . Unstable angina ([place])   . Encounter for anticoagulation discussion and counseling   . C. difficile colitis 01/14/2018  . GIB (gastrointestinal bleeding) 01/08/2018  . SOB  (shortness of breath) 11/01/2017  . Influenza A 10/29/2017  . Acute respiratory failure ([place]) 09/24/2017  . Sepsis ([place]) 07/03/2017  . COPD (chronic obstructive pulmonary disease) ([place]) 04/26/2017  . Community acquired pneumonia 04/25/2017  . Chronic systolic heart failure ([place]) 02/03/2017  . Hypotension 02/03/2017  . Chest pain 12/09/2016  . RLS (restless legs syndrome) 12/09/2016  . Cough, persistent 11/22/2015  . Hypokalemia 11/22/2015  . Leukocytosis 11/22/2015  . Dyspnea 11/21/2015  . Respiratory distress 09/19/2015  . Breast pain 01/11/2013  . Hx MRSA infection   . Lump or mass in breast   . GOITER, MULTINODULAR 01/20/2009  . HYPOGLYCEMIA, UNSPECIFIED 01/20/2009  . GERD 01/20/2009  . DIVERTICULITIS OF COLON 01/20/2009    Past Surgical History:  Procedure Laterality Date  . ABDOMINAL HYSTERECTOMY     age 70  . BREAST BIOPSY Right 2014   benign  . CARDIAC DEFIBRILLATOR PLACEMENT    . CARDIAC DEFIBRILLATOR PLACEMENT    . CESAREAN SECTION    . CHOLECYSTECTOMY    . COLECTOMY    . COLONOSCOPY WITH PROPOFOL N/A 02/22/2018   Procedure: COLONOSCOPY WITH PROPOFOL;  Surgeon: Toledo, Benay Pike, MD;  Location: [facility] ENDOSCOPY;  Service: Gastroenterology;  Laterality: N/A;  . ESOPHAGOGASTRODUODENOSCOPY (EGD) WITH PROPOFOL N/A 02/22/2018   Procedure: ESOPHAGOGASTRODUODENOSCOPY (EGD) WITH PROPOFOL;  Surgeon: Toledo, Benay Pike, MD;  Location: [facility] ENDOSCOPY;  Service: Gastroenterology;  Laterality: N/A;  . NASAL SINUS SURGERY  2012  .  OOPHORECTOMY      Prior to Admission medications   Medication Sig Start Date End Date Taking? Authorizing Provider  albuterol (ACCUNEB) 1.25 MG/3ML nebulizer solution Take 3 mLs by nebulization every 6 (six) hours as needed for wheezing. 11/09/17   [provider]  albuterol (VENTOLIN HFA) 108 (90 Base) MCG/ACT inhaler Inhale 2 puffs into the lungs every 6 (six) hours as needed for shortness of breath. 08/28/16   [provider]    beclomethasone (QVAR) 80 MCG/ACT inhaler Inhale 2 puffs into the lungs 2 (two) times daily. 06/05/17 10/03/18  Laverle Hobby, MD  beclomethasone (QVAR) 80 MCG/ACT inhaler Inhale 1 puff into the lungs 2 (two) times daily. 06/12/17   [provider]  busPIRone (BUSPAR) 15 MG tablet Take 15 mg by mouth 2 (two) times daily. 05/08/18   [provider]  doxycycline (VIBRAMYCIN) 100 MG capsule Take 1 capsule (100 mg total) by mouth 2 (two) times daily. Patient not taking: Reported on 05/02/2019 02/27/19   Johnn Hai, PA-C  famotidine (PEPCID) 20 MG tablet Take 1 tablet (20 mg total) by mouth 2 (two) times daily. 03/15/19   Carrie Mew, MD  fentaNYL (DURAGESIC) 50 MCG/HR Place 1 patch onto the skin every 3 (three) days. 10/08/18   Epifanio Lesches, MD  furosemide (LASIX) 40 MG tablet Take 80 mg by mouth 2 (two) times daily.  07/09/18   [provider]  hydrOXYzine (ATARAX/VISTARIL) 25 MG tablet Take 25 mg by mouth every 6 (six) hours as needed for itching. 04/13/19   [provider]  levothyroxine (SYNTHROID, LEVOTHROID) 150 MCG tablet Take 150 mcg by mouth daily before breakfast.    [provider]  losartan (COZAAR) 25 MG tablet Take 25 mg by mouth daily.     [provider]  metoprolol succinate (TOPROL-XL) 50 MG 24 hr tablet Take 50 mg by mouth 2 (two) times daily. 01/25/19   [provider]  mirtazapine (REMERON) 15 MG tablet Take 15 mg by mouth at bedtime. 04/26/19 05/26/24  [provider]  nitroGLYCERIN (NITROSTAT) 0.4 MG SL tablet Place 1 tablet (0.4 mg total) under the tongue every 5 (five) minutes as needed for chest pain. 01/24/17   Henreitta Leber, MD  omeprazole (PRILOSEC) 40 MG capsule Take 40 mg by mouth 2 (two) times daily. 10/19/18   [provider]  oxyCODONE (OXY IR/ROXICODONE) 5 MG immediate release tablet Take 5-10 mg by mouth every 4 (four) hours as needed for pain. 04/11/19   [provider]  oxyCODONE-acetaminophen (PERCOCET) 5-325 MG tablet Take 1 tablet by mouth every 6 (six) hours as needed for severe pain. Patient not taking: Reported on 05/02/2019 02/27/19   Johnn Hai, PA-C  Pancrelipase, Lip-Prot-Amyl, (CREON) 24000-76000 units CPEP Take 3-4 capsules by mouth 3 (three) times daily with meals. 04/01/19   [provider]  potassium chloride SA (KLOR-CON M20) 20 MEQ tablet Take 1 tablet (20 mEq total) by mouth 2 (two) times daily for 7 days. 07/09/19 07/16/19  Carrie Mew, MD  promethazine (PHENERGAN) 25 MG tablet Take 1 tablet (25 mg total) by mouth every 6 (six) hours as needed. 07/09/19   Carrie Mew, MD  rOPINIRole (REQUIP) 4 MG tablet Take 4 mg by mouth at bedtime. 01/30/19   [provider]  spironolactone (ALDACTONE) 50 MG tablet Take 50 mg by mouth daily. 11/26/18   [provider]  sucralfate (CARAFATE) 1 g tablet Take 1 tablet (1 g total) by mouth 4 (four) times  daily. 03/15/19   Carrie Mew, MD  vitamin B-12 (CYANOCOBALAMIN) 1000 MCG tablet Take 1,000 mcg by mouth daily.    [provider]    Allergies Contrast media [iodinated diagnostic agents], Lidocaine, Metrizamide, Penicillins, Isosorbide nitrate, Latex, Ondansetron, Zofran [ondansetron hcl], Hydromorphone, Povidone-iodine, and Pulmicort [budesonide]  Family History  Problem Relation Age of Onset  . Cancer Mother 30       ovarian  . CAD Mother   . Cancer Father 82       brain  . CAD Father   . Cancer Daughter 18       skin  . Cancer Maternal Aunt 96       breast  . Leukemia Paternal Grandfather     Social History Social History   Tobacco Use  . Smoking status: Never Smoker  . Smokeless tobacco: Never Used  Substance Use Topics  . Alcohol use: No  . Drug use: No      Review of Systems Constitutional: No fever/chills Eyes: No visual changes. ENT: No sore throat. Cardiovascular: Denies chest pain. Respiratory: Denies shortness of  breath. Gastrointestinal: mild epigastric tenderness, vomiting, and diarrhea  Genitourinary: Negative for dysuria. Musculoskeletal: Negative for back pain. Skin: Negative for rash. Neurological: Negative for headaches, focal weakness or numbness. All other ROS negative ____________________________________________   PHYSICAL EXAM:  VITAL SIGNS: ED Triage Vitals  Enc Vitals Group     BP 09/10/19 1211 (!) 103/49     Pulse Rate 09/10/19 1211 97     Resp 09/10/19 1211 18     Temp 09/10/19 1211 98.3 F (36.8 C)     Temp Source 09/10/19 1211 Oral     SpO2 09/10/19 1211 98 %     Weight 09/10/19 1211 213 lb (96.6 kg)     Height 09/10/19 1211 5\' 5"  (1.651 m)     Head Circumference --      Peak Flow --      Pain Score 09/10/19 1222 9     Pain Loc --      Pain Edu? --      Excl. in [place]? --     Constitutional: Alert and oriented. Well appearing and in no acute distress. Eyes: Conjunctivae are normal. EOMI. Head: Atraumatic. Nose: No congestion/rhinnorhea. Mouth/Throat: Mucous membranes are moist.   Neck: No stridor. Trachea Midline. FROM Cardiovascular: Normal rate, regular rhythm. Grossly normal heart sounds.  Good peripheral circulation. Respiratory: Normal respiratory effort.  No retractions. Lungs CTAB. Gastrointestinal: mild epigastric tenderness. No distention. No abdominal bruits.  Musculoskeletal: No lower extremity tenderness nor edema.  No joint effusions. Neurologic:  Normal speech and language. No gross focal neurologic deficits are appreciated.  Skin:  Skin is warm, dry and intact. No rash noted. Psychiatric: Mood and affect are normal. Speech and behavior are normal. GU: Deferred   ____________________________________________   LABS (all labs ordered are listed, but only abnormal results are displayed)  Labs Reviewed  COMPREHENSIVE METABOLIC PANEL - Abnormal; Notable for the following components:      Result Value   Potassium 3.0 (*)    Chloride 97 (*)     Glucose, Bld 101 (*)    Calcium 8.6 (*)    All other components within normal limits  GI PATHOGEN PANEL BY PCR, STOOL  C DIFFICILE QUICK SCREEN W PCR REFLEX  SARS CORONAVIRUS 2 (TAT 6-24 HRS)  LIPASE, BLOOD  CBC  URINALYSIS, COMPLETE (UACMP) WITH MICROSCOPIC  TROPONIN I (HIGH SENSITIVITY)   ____________________________________________   ED ECG  REPORT I, Vanessa Drummond, the attending physician, personally viewed and interpreted this ECG.  EKG is sinus tachycardia rate of 104, no ST elevation, no T wave inversions, occasional PVC ____________________________________________    PROCEDURES  Procedure(s) performed (including Critical Care):  Procedures   ____________________________________________   INITIAL IMPRESSION / ASSESSMENT AND PLAN / ED COURSE  Tynae Dreessen Zinn was evaluated in Emergency Department on 09/10/2019 for the symptoms described in the history of present illness. She was evaluated in the context of the global COVID-19 pandemic, which necessitated consideration that the patient might be at risk for infection with the SARS-CoV-2 virus that causes COVID-19. Institutional protocols and algorithms that pertain to the evaluation of patients at risk for COVID-19 are in a state of rapid change based on information released by regulatory bodies including the CDC and federal and state organizations. These policies and algorithms were followed during the patient's care in the ED.    Pt presents with N,V,D.  Patient had similar symptoms secondary to her known gastric tumor.  She plans to have surgery in 2 weeks.  Patient was sent here by oncology to rule out dehydration.  Without labs to evaluate for electrolyte abnormalities, AKI.  Less likely related to gallbladder given she is already had a prior cholecystectomy and her LFTs are normal therefore unlikely retained stone.  She is a little bit of epigastric tenderness but no history of ulcers.  No lower abdominal tenderness to  suggest SBO, diverticulitis.  We discussed CT imaging but elected to hold off given no lower abdominal pain, no rebound, no gaurding. She has had diverticulitis before but states this feels different.  Will get stool sample and retest for coronavirus.  I very low suspicion for ACS but given the pain in her armpit and there is no palpable mass will get EKG and cardiac marker to confirm.  Labs re-assuring. K low but on K at home already.   Trop rules out   We will give symptomatic treatment and reevaluate patient.  Patient is now requesting to leave.  Patient states that her family member sick and that her husband is coming to pick her up.  She does not want to stay for symptomatic treatment.  She is comfortable sending off a stool samples given the Covid test.   I discussed the provisional nature of ED diagnosis, the treatment so far, the ongoing plan of care, follow up appointments and return precautions with the patient and any family or support people present. They expressed understanding and agreed with the plan, discharged home.   ____________________________________________   FINAL CLINICAL IMPRESSION(S) / ED DIAGNOSES   Final diagnoses:  Nausea and vomiting, intractability of vomiting not specified, unspecified vomiting type  Diarrhea, unspecified type      MEDICATIONS GIVEN DURING THIS VISIT:  Medications  sodium chloride flush (NS) 0.9 % injection 3 mL (has no administration in time range)  sodium chloride 0.9 % bolus 1,000 mL (has no administration in time range)  promethazine (PHENERGAN) injection 12.5 mg (has no administration in time range)  ketorolac (TORADOL) 30 MG/ML injection 15 mg (has no administration in time range)  alum & mag hydroxide-simeth (MAALOX/MYLANTA) 200-200-20 MG/5ML suspension 30 mL (has no administration in time range)  potassium chloride SA (KLOR-CON) CR tablet 40 mEq (has no administration in time range)     ED Discharge Orders    None        Note:  This document was prepared using Dragon voice recognition software and may  include unintentional dictation errors.   Vanessa Ellsworth, MD 09/10/19 1418    Vanessa Bethune, MD 09/10/19 [phone]

## 2019-09-11 LAB — SARS CORONAVIRUS 2 (TAT 6-24 HRS): SARS Coronavirus 2: NEGATIVE

## 2019-09-12 LAB — GI PATHOGEN PANEL BY PCR, STOOL
Adenovirus F 40/41: NOT DETECTED
Astrovirus: NOT DETECTED
Campylobacter by PCR: DETECTED — AB
Cryptosporidium by PCR: NOT DETECTED
Cyclospora cayetanensis: NOT DETECTED
E coli (ETEC) LT/ST: NOT DETECTED
E coli (STEC): NOT DETECTED
Entamoeba histolytica: NOT DETECTED
Enteroaggregative E coli: NOT DETECTED
Enteropathogenic E coli: NOT DETECTED
G lamblia by PCR: NOT DETECTED
Norovirus GI/GII: NOT DETECTED
Plesiomonas shigelloides: NOT DETECTED
Rotavirus A by PCR: NOT DETECTED
Salmonella by PCR: NOT DETECTED
Sapovirus: NOT DETECTED
Shigella by PCR: NOT DETECTED
Vibrio cholerae: NOT DETECTED
Vibrio: NOT DETECTED
Yersinia enterocolitica: NOT DETECTED

## 2019-09-13 ENCOUNTER — Emergency Department
Admission: EM | Admit: 2019-09-13 | Discharge: 2019-09-13 | Disposition: A | Discharge status: Home / Self Care | Payer: Medicare Other | Attending: Emergency Medicine | Admitting: Emergency Medicine

## 2019-09-13 ENCOUNTER — Encounter: Payer: Self-pay | Admitting: Emergency Medicine

## 2019-09-13 ENCOUNTER — Other Ambulatory Visit: Payer: Self-pay

## 2019-09-13 DIAGNOSIS — Z85028 Personal history of other malignant neoplasm of stomach: Secondary | ICD-10-CM | POA: Insufficient documentation

## 2019-09-13 DIAGNOSIS — R1010 Upper abdominal pain, unspecified: Secondary | ICD-10-CM | POA: Diagnosis not present

## 2019-09-13 DIAGNOSIS — R112 Nausea with vomiting, unspecified: Secondary | ICD-10-CM | POA: Diagnosis present

## 2019-09-13 DIAGNOSIS — J449 Chronic obstructive pulmonary disease, unspecified: Secondary | ICD-10-CM | POA: Insufficient documentation

## 2019-09-13 DIAGNOSIS — Z91040 Latex allergy status: Secondary | ICD-10-CM | POA: Diagnosis not present

## 2019-09-13 DIAGNOSIS — Z95810 Presence of automatic (implantable) cardiac defibrillator: Secondary | ICD-10-CM | POA: Insufficient documentation

## 2019-09-13 LAB — COMPREHENSIVE METABOLIC PANEL
ALT: 21 U/L (ref 0–44)
AST: 22 U/L (ref 15–41)
Albumin: 4.1 g/dL (ref 3.5–5.0)
Alkaline Phosphatase: 95 U/L (ref 38–126)
Anion gap: 10 (ref 5–15)
BUN: 11 mg/dL (ref 6–20)
CO2: 28 mmol/L (ref 22–32)
Calcium: 8.8 mg/dL — ABNORMAL LOW (ref 8.9–10.3)
Chloride: 98 mmol/L (ref 98–111)
Creatinine, Ser: 1.1 mg/dL — ABNORMAL HIGH (ref 0.44–1.00)
GFR calc Af Amer: >60 mL/min (ref >60)
GFR calc non Af Amer: 58 mL/min — ABNORMAL LOW (ref >60)
Glucose, Bld: 107 mg/dL — ABNORMAL HIGH (ref 70–99)
Potassium: 3.2 mmol/L — ABNORMAL LOW (ref 3.5–5.1)
Sodium: 136 mmol/L (ref 135–145)
Total Bilirubin: 0.6 mg/dL (ref 0.3–1.2)
Total Protein: 7.6 g/dL (ref 6.5–8.1)

## 2019-09-13 LAB — TYPE AND SCREEN
ABO/RH(D): O NEG
Antibody Screen: NEGATIVE

## 2019-09-13 LAB — CBC
HCT: 39.1 % (ref 36.0–46.0)
Hemoglobin: 13.6 g/dL (ref 12.0–15.0)
MCH: 30.7 pg (ref 26.0–34.0)
MCHC: 34.8 g/dL (ref 30.0–36.0)
MCV: 88.3 fL (ref 80.0–100.0)
Platelets: 286 10*3/uL (ref 150–400)
RBC: 4.43 MIL/uL (ref 3.87–5.11)
RDW: 13.3 % (ref 11.5–15.5)
WBC: 11.3 10*3/uL — ABNORMAL HIGH (ref 4.0–10.5)
nRBC: 0 % (ref 0.0–0.2)

## 2019-09-13 IMAGING — US US EXTREM LOW VENOUS BILAT
1 series · 13 of 24 positions shown · non-contrast
Comparison: 12/14/2012

CLINICAL DATA: Chronic bilateral leg pain.  Edema.



[Series 1: us extrem low venous bilat · 0.10mm/px · 13 of 63 slices shown]
[im 1/63]
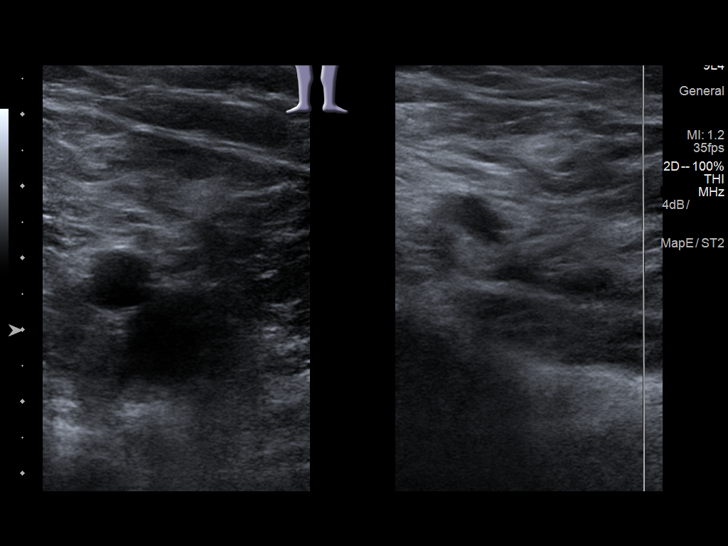
[im 6/63]
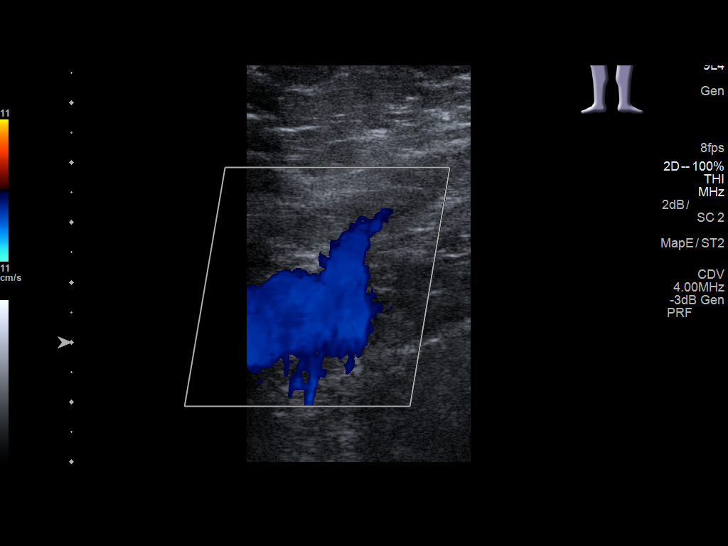
[im 11/63]
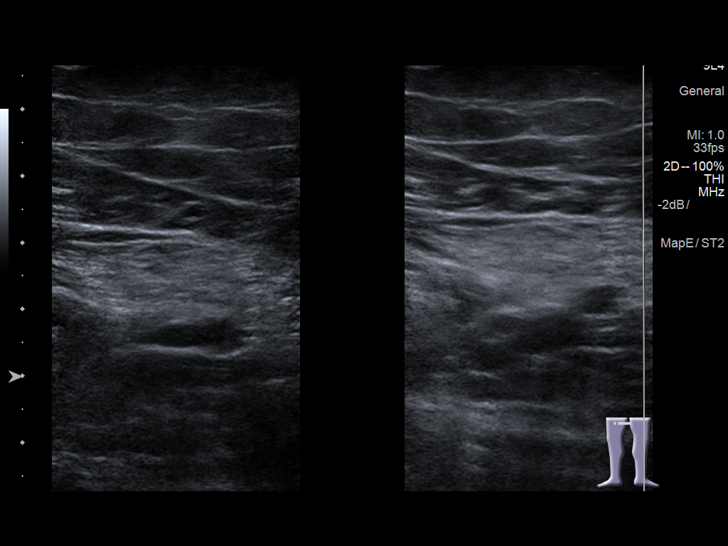
[im 17/63]
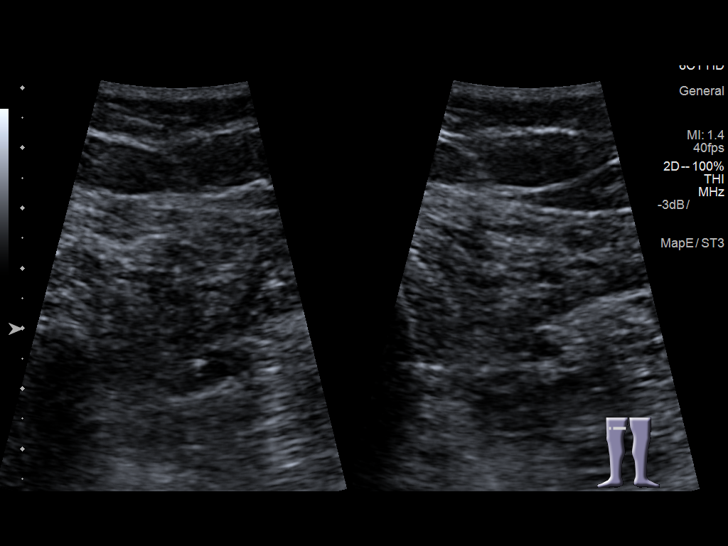
[im 22/63]
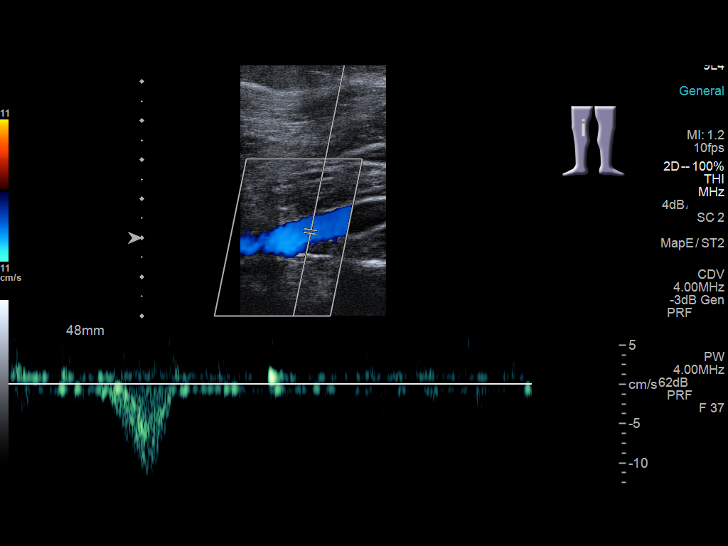
[im 27/63]
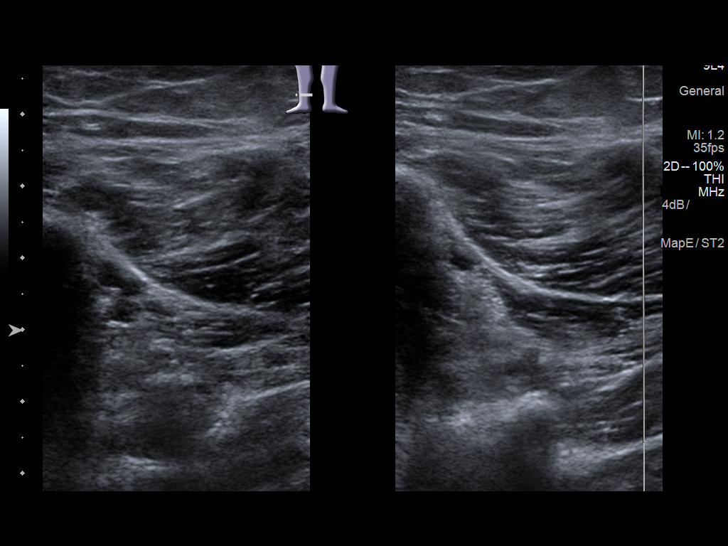
[im 33/63]
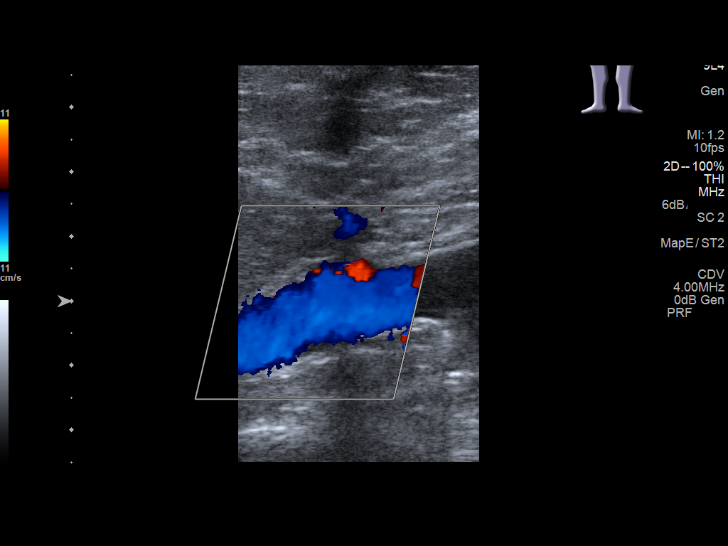
[im 36/63]
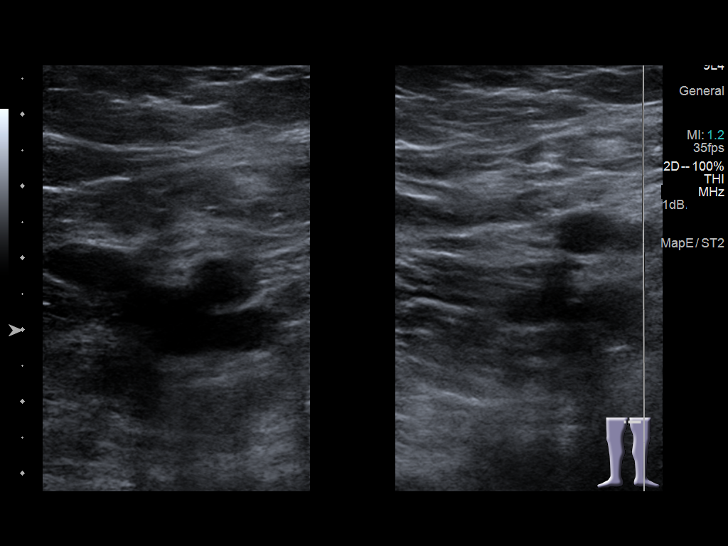
[im 41/63]
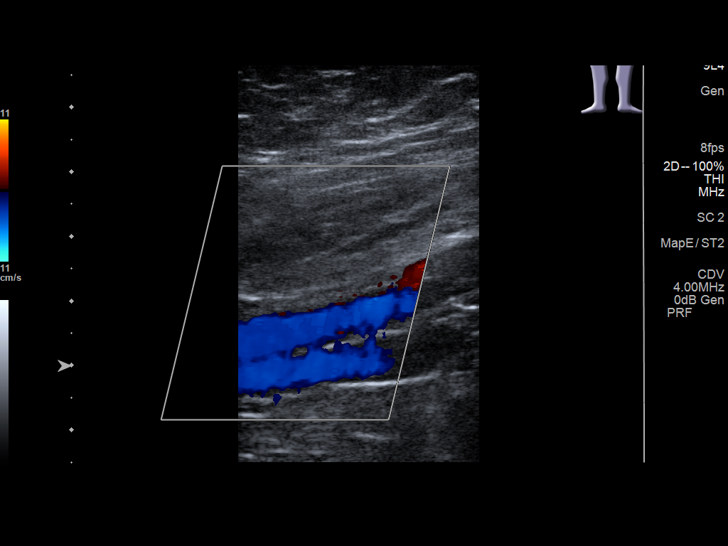
[im 46/63]
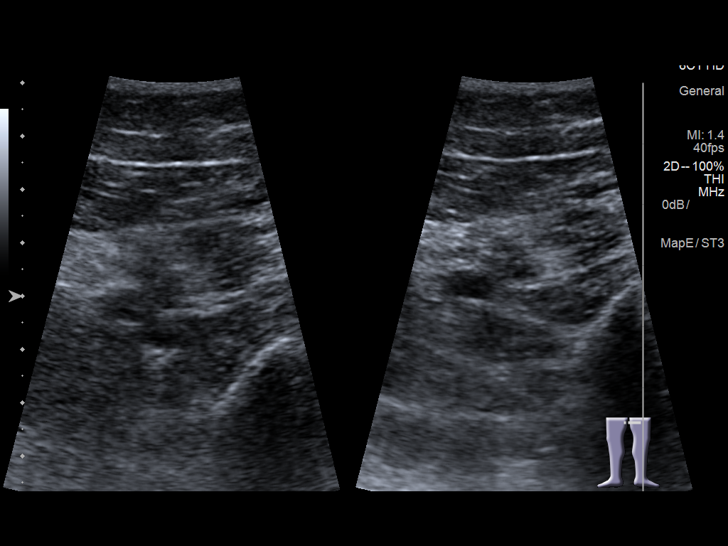
[im 52/63]
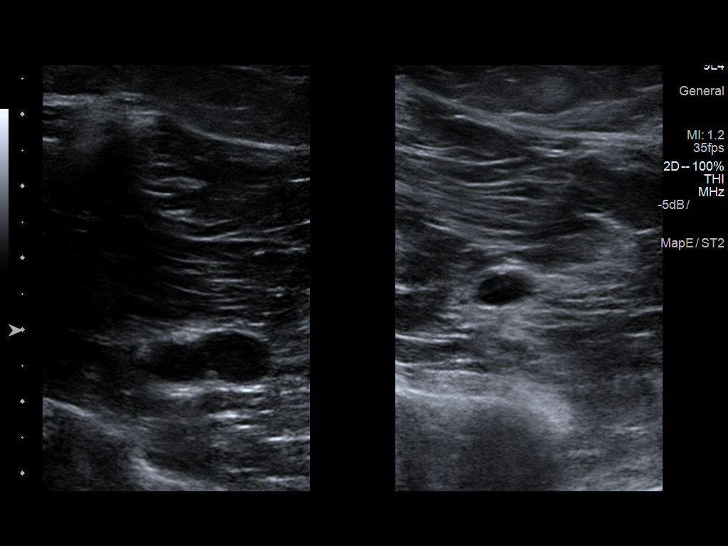
[im 57/63]
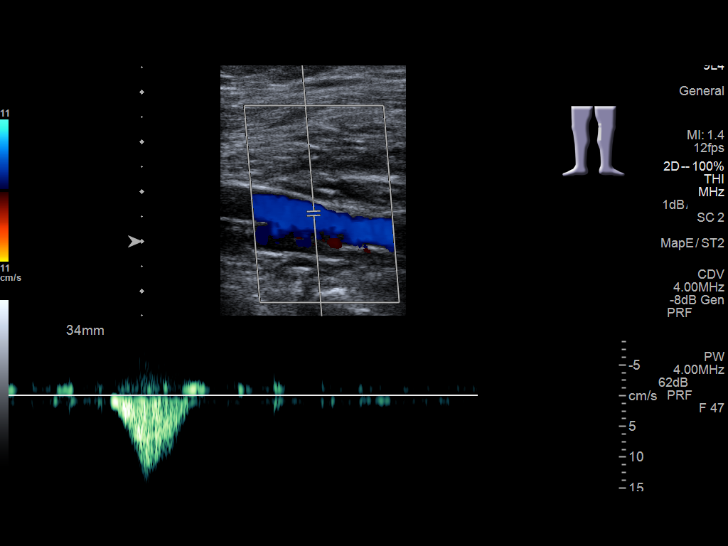
[im 63/63]
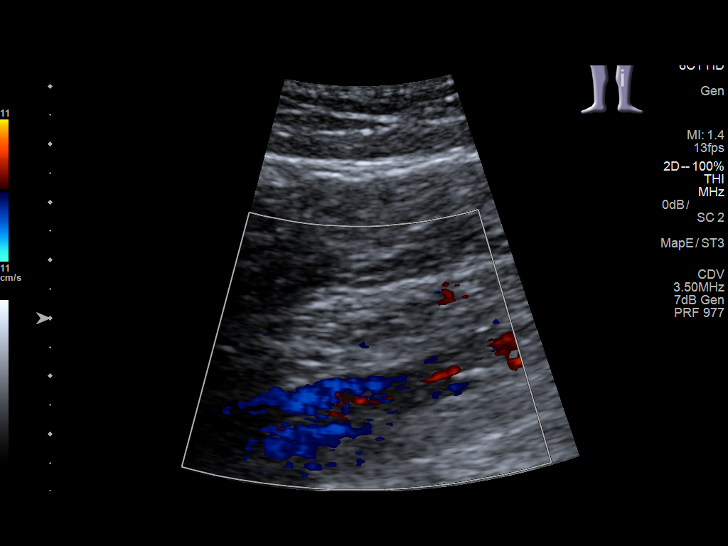

[13 of 24 positions shown; findings below may reference images not displayed]

FINDINGS: RIGHT LOWER EXTREMITY

Common Femoral Vein: No evidence of thrombus. Normal
compressibility, respiratory phasicity and response to augmentation.

Saphenofemoral Junction: No evidence of thrombus. Normal
compressibility and flow on color Doppler imaging.

Profunda Femoral Vein: No evidence of thrombus. Normal
compressibility and flow on color Doppler imaging.

Femoral Vein: No evidence of thrombus. Normal compressibility,
respiratory phasicity and response to augmentation.

Popliteal Vein: No evidence of thrombus. Normal compressibility,
respiratory phasicity and response to augmentation.

Calf Veins: No evidence of thrombus. Normal compressibility and flow
on color Doppler imaging.

LEFT LOWER EXTREMITY

Common Femoral Vein: No evidence of thrombus. Normal
compressibility, respiratory phasicity and response to augmentation.

Saphenofemoral Junction: No evidence of thrombus. Normal
compressibility and flow on color Doppler imaging.

Profunda Femoral Vein: No evidence of thrombus. Normal
compressibility and flow on color Doppler imaging.

Femoral Vein: No evidence of thrombus. Normal compressibility,
respiratory phasicity and response to augmentation.

Popliteal Vein: No evidence of thrombus. Normal compressibility,
respiratory phasicity and response to augmentation.

Calf Veins: No evidence of thrombus. Normal compressibility and flow
on color Doppler imaging.
IMPRESSION: No evidence of deep venous thrombosis in the lower extremities.

## 2019-09-13 MED ORDER — PROMETHAZINE HCL 25 MG/ML IJ SOLN
25.0000 mg | Freq: Once | INTRAMUSCULAR | Status: AC
  Administered 2019-09-13: 21:00:00 25 mg via INTRAVENOUS
  Filled 2019-09-13: qty 1

## 2019-09-13 MED ORDER — MORPHINE SULFATE (PF) 4 MG/ML IV SOLN
4.0000 mg | Freq: Once | INTRAVENOUS | Status: AC
  Administered 2019-09-13: 21:00:00 4 mg via INTRAVENOUS
  Filled 2019-09-13: qty 1

## 2019-09-13 MED ORDER — SODIUM CHLORIDE 0.9 % IV SOLN: Freq: Once | INTRAVENOUS | Status: AC

## 2019-09-13 MED ORDER — MORPHINE SULFATE (PF) 2 MG/ML IV SOLN
2.0000 mg | Freq: Once | INTRAVENOUS | Status: AC
  Administered 2019-09-13: 2 mg via INTRAVENOUS
  Filled 2019-09-13: qty 1

## 2019-09-13 NOTE — ED Provider Notes (Signed)
[facility] Emergency Department Provider Note       Time seen: ----------------------------------------- 7:40 PM on 09/13/2019 ----------------------------------------- I have reviewed the triage vital signs and the nursing notes.  HISTORY   Chief Complaint GI Bleeding and Abdominal Pain   HPI Brittany Nguyen is a 54 y.o. female with a history of asthma, carcinoid tumor, CHF, COPD, DVT, who presents to the ED for intractable nausea vomiting with abdominal pain.  Patient is seeing GI for her carcinoid tumor.  She was seen 2 days ago for same and referred to GI.  She states she has chronic diarrhea.  Past Medical History:  Diagnosis Date  . Asthma 2013  . 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 CHF (congestive heart failure) ([place])   . COPD (chronic obstructive pulmonary disease) ([place])   . Diverticulitis 2010  . DVT (deep venous thrombosis) ([place])   . Endometriosis 1990  . Heart disease 2013  . Hx MRSA infection   . Iron deficiency   . Lump or mass in breast   . Restless leg   . Stomach cancer [facility])     Patient Active Problem List   Diagnosis Date Noted  . Iron deficiency anemia 03/27/2018  . Anemia 03/26/2018  . Abdominal pain 02/20/2018  . Unstable angina ([place])   . Encounter for anticoagulation discussion and counseling   . C. difficile colitis 01/14/2018  . GIB (gastrointestinal bleeding) 01/08/2018  . SOB (shortness of breath) 11/01/2017  . Influenza A 10/29/2017  . Acute respiratory failure ([place]) 09/24/2017  . Sepsis ([place]) 07/03/2017  . COPD (chronic obstructive pulmonary disease) ([place]) 04/26/2017  . Community acquired pneumonia 04/25/2017  . Chronic systolic heart failure ([place]) 02/03/2017  . Hypotension 02/03/2017  . Chest pain 12/09/2016  . RLS (restless legs syndrome) 12/09/2016  . Cough, persistent 11/22/2015  . Hypokalemia 11/22/2015  . Leukocytosis 11/22/2015  . Dyspnea 11/21/2015  . Respiratory distress  09/19/2015  . Breast pain 01/11/2013  . Hx MRSA infection   . Lump or mass in breast   . GOITER, MULTINODULAR 01/20/2009  . HYPOGLYCEMIA, UNSPECIFIED 01/20/2009  . GERD 01/20/2009  . DIVERTICULITIS OF COLON 01/20/2009    Past Surgical History:  Procedure Laterality Date  . ABDOMINAL HYSTERECTOMY     age 84  . BREAST BIOPSY Right 2014   benign  . CARDIAC DEFIBRILLATOR PLACEMENT    . CARDIAC DEFIBRILLATOR PLACEMENT    . CESAREAN SECTION    . CHOLECYSTECTOMY    . COLECTOMY    . COLONOSCOPY WITH PROPOFOL N/A 02/22/2018   Procedure: COLONOSCOPY WITH PROPOFOL;  Surgeon: Toledo, Benay Pike, MD;  Location: [facility] ENDOSCOPY;  Service: Gastroenterology;  Laterality: N/A;  . ESOPHAGOGASTRODUODENOSCOPY (EGD) WITH PROPOFOL N/A 02/22/2018   Procedure: ESOPHAGOGASTRODUODENOSCOPY (EGD) WITH PROPOFOL;  Surgeon: Toledo, Benay Pike, MD;  Location: [facility] ENDOSCOPY;  Service: Gastroenterology;  Laterality: N/A;  . NASAL SINUS SURGERY  2012  . OOPHORECTOMY      Allergies Contrast media [iodinated diagnostic agents], Lidocaine, Metrizamide, Penicillins, Isosorbide nitrate, Latex, Ondansetron, Zofran [ondansetron hcl], Hydromorphone, Povidone-iodine, and Pulmicort [budesonide]  Social History Social History   Tobacco Use  . Smoking status: Never Smoker  . Smokeless tobacco: Never Used  Substance Use Topics  . Alcohol use: No  . Drug use: No    Review of Systems Constitutional: Negative for fever. Cardiovascular: Negative for chest pain. Respiratory: Negative for shortness of breath. Gastrointestinal: Positive for abdominal pain, positive for vomiting, chronic diarrhea Musculoskeletal: Negative for back pain.  Skin: Negative for rash. Neurological: Negative for headaches, focal weakness or numbness.  All systems negative/normal/unremarkable except as stated in the HPI  ____________________________________________   PHYSICAL EXAM:  VITAL SIGNS: ED Triage Vitals [09/13/19 1753]  Enc Vitals  Group     BP 121/76     Pulse Rate 87     Resp 20     Temp 98.4 F (36.9 C)     Temp Source Oral     SpO2 95 %     Weight 213 lb (96.6 kg)     Height 5\' 5"  (1.651 m)     Head Circumference      Peak Flow      Pain Score 10     Pain Loc      Pain Edu?      Excl. in [place]?    Constitutional: Alert and oriented. Well appearing and in no distress. Eyes: Conjunctivae are normal. Normal extraocular movements. Cardiovascular: Normal rate, regular rhythm. No murmurs, rubs, or gallops. Respiratory: Normal respiratory effort without tachypnea nor retractions. Breath sounds are clear and equal bilaterally. No wheezes/rales/rhonchi. Gastrointestinal: Soft and nontender. Normal bowel sounds Musculoskeletal: Nontender with normal range of motion in extremities. No lower extremity tenderness nor edema. Neurologic:  Normal speech and language. No gross focal neurologic deficits are appreciated.  Skin:  Skin is warm, dry and intact. No rash noted. Psychiatric: Mood and affect are normal. Speech and behavior are normal.  ____________________________________________  ED COURSE:  As part of my medical decision making, I reviewed the following data within the [place] History obtained from family if available, nursing notes, old chart and ekg, as well as notes from prior ED visits. Patient presented for abdominal pain with nausea vomiting, we will assess with labs and imaging as indicated at this time.   Procedures  Brittany Nguyen was evaluated in Emergency Department on 09/13/2019 for the symptoms described in the history of present illness. She was evaluated in the context of the global COVID-19 pandemic, which necessitated consideration that the patient might be at risk for infection with the SARS-CoV-2 virus that causes COVID-19. Institutional protocols and algorithms that pertain to the evaluation of patients at risk for COVID-19 are in a state of rapid change based on  information released by regulatory bodies including the CDC and federal and state organizations. These policies and algorithms were followed during the patient's care in the ED.  ____________________________________________   LABS (pertinent positives/negatives)  Labs Reviewed  COMPREHENSIVE METABOLIC PANEL - Abnormal; Notable for the following components:      Result Value   Potassium 3.2 (*)    Glucose, Bld 107 (*)    Creatinine, Ser 1.10 (*)    Calcium 8.8 (*)    GFR calc non Af Amer 58 (*)    All other components within normal limits  CBC - Abnormal; Notable for the following components:   WBC 11.3 (*)    All other components within normal limits  POC OCCULT BLOOD, ED  TYPE AND SCREEN   ____________________________________________   DIFFERENTIAL DIAGNOSIS   Dehydration, electrolyte abnormality, gastroenteritis, gastritis, carcinoid tumor, chronic pain  FINAL ASSESSMENT AND PLAN  Abdominal pain, vomiting   Plan: The patient had presented for abdominal pain and vomiting. Patient's labs were grossly unremarkable.  She did not require any imaging during this visit.  She was given IV Phenergan and fluids and seems to be tolerating food here.  She is cleared for outpatient follow-up.   Johnathan  Alain Honey, MD    Note: This note was generated in part or whole with voice recognition software. Voice recognition is usually quite accurate but there are transcription errors that can and very often do occur. I apologize for any typographical errors that were not detected and corrected.     Earleen Newport, MD 09/13/19 2141

## 2019-09-13 NOTE — ED Notes (Signed)
Pt requesting ice water and graham crackers (x2); ok per Jimmye Norman, MD.

## 2019-09-13 NOTE — ED Triage Notes (Addendum)
First RN Note: Pt presents to ED via POV with c/o intractable N/V and abdominal pain. Pt states seeing GI for tumors in her stomach. Pt seen 2 days ago for same. Pt was referred by GI.

## 2019-10-08 IMAGING — DX DG CHEST 1V PORT
1 series · 1 of 1 positions shown · non-contrast
Comparison: 06/24/2018

CLINICAL DATA: Respiratory distress. History of COPD, asthma, heart
disease, DVT, nonsmoker.

EXAM:
PORTABLE CHEST 1 VIEW

[chest ap]
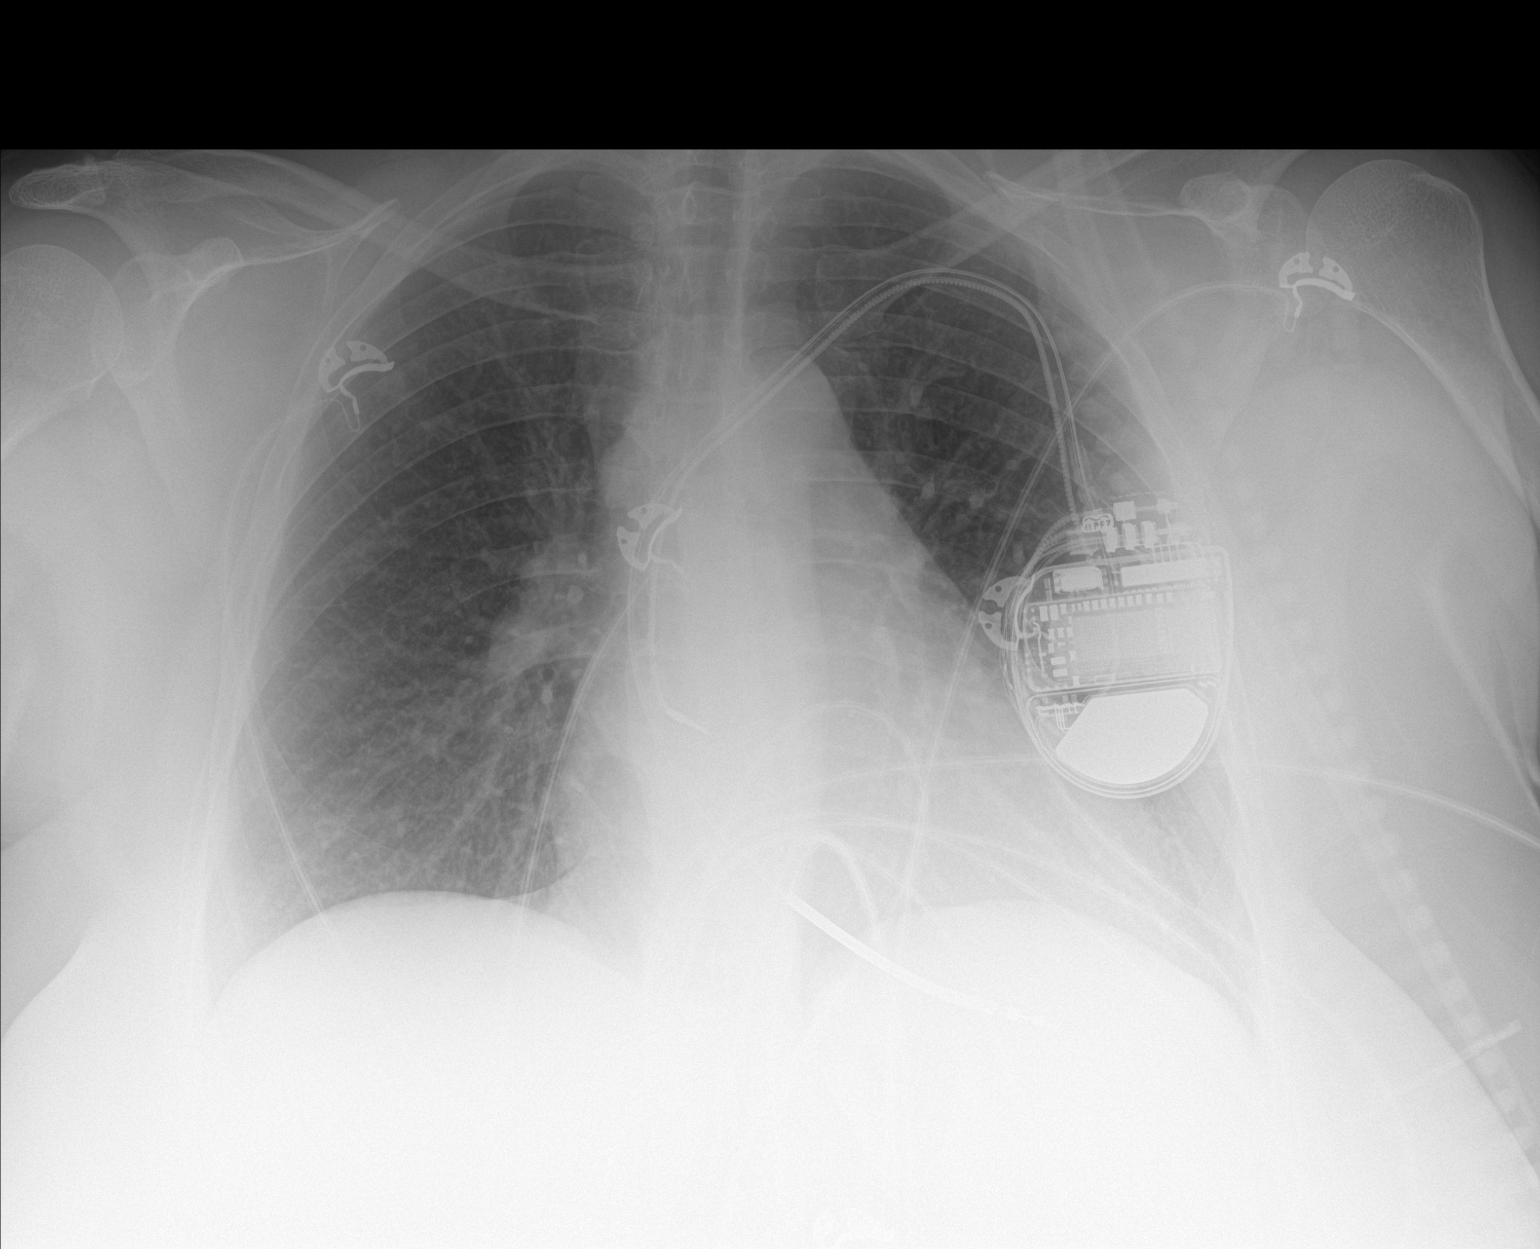

[1 of 1 positions shown; findings below may reference images not displayed]

FINDINGS: Cardiac pacemaker. Heart size and pulmonary vascularity are normal
for technique. Lungs appear clear and expanded. No blunting of
costophrenic angles. No pneumothorax. Mediastinal contours appear
intact.
IMPRESSION: No active disease.

## 2019-11-20 ENCOUNTER — Encounter: Payer: Self-pay | Admitting: Emergency Medicine

## 2019-11-20 ENCOUNTER — Emergency Department: Payer: Medicare Other

## 2019-11-20 ENCOUNTER — Emergency Department
Admission: EM | Admit: 2019-11-20 | Discharge: 2019-11-20 | Disposition: A | Discharge status: Home / Self Care | Payer: Medicare Other | Attending: Emergency Medicine | Admitting: Emergency Medicine

## 2019-11-20 DIAGNOSIS — J45909 Unspecified asthma, uncomplicated: Secondary | ICD-10-CM | POA: Insufficient documentation

## 2019-11-20 DIAGNOSIS — Z79899 Other long term (current) drug therapy: Secondary | ICD-10-CM | POA: Diagnosis not present

## 2019-11-20 DIAGNOSIS — R569 Unspecified convulsions: Secondary | ICD-10-CM

## 2019-11-20 DIAGNOSIS — I5022 Chronic systolic (congestive) heart failure: Secondary | ICD-10-CM | POA: Diagnosis not present

## 2019-11-20 DIAGNOSIS — Z85028 Personal history of other malignant neoplasm of stomach: Secondary | ICD-10-CM | POA: Insufficient documentation

## 2019-11-20 DIAGNOSIS — R519 Headache, unspecified: Secondary | ICD-10-CM | POA: Insufficient documentation

## 2019-11-20 DIAGNOSIS — Z95810 Presence of automatic (implantable) cardiac defibrillator: Secondary | ICD-10-CM | POA: Insufficient documentation

## 2019-11-20 DIAGNOSIS — Z91040 Latex allergy status: Secondary | ICD-10-CM | POA: Insufficient documentation

## 2019-11-20 LAB — CBC
HCT: 42.4 % (ref 36.0–46.0)
Hemoglobin: 14.2 g/dL (ref 12.0–15.0)
MCH: 30.7 pg (ref 26.0–34.0)
MCHC: 33.5 g/dL (ref 30.0–36.0)
MCV: 91.8 fL (ref 80.0–100.0)
Platelets: 298 10*3/uL (ref 150–400)
RBC: 4.62 MIL/uL (ref 3.87–5.11)
RDW: 13.3 % (ref 11.5–15.5)
WBC: 13.7 10*3/uL — ABNORMAL HIGH (ref 4.0–10.5)
nRBC: 0 % (ref 0.0–0.2)

## 2019-11-20 LAB — BASIC METABOLIC PANEL
Anion gap: 14 (ref 5–15)
BUN: 15 mg/dL (ref 6–20)
CO2: 24 mmol/L (ref 22–32)
Calcium: 9.2 mg/dL (ref 8.9–10.3)
Chloride: 101 mmol/L (ref 98–111)
Creatinine, Ser: 1.11 mg/dL — ABNORMAL HIGH (ref 0.44–1.00)
GFR calc Af Amer: >60 mL/min (ref >60)
GFR calc non Af Amer: 57 mL/min — ABNORMAL LOW (ref >60)
Glucose, Bld: 104 mg/dL — ABNORMAL HIGH (ref 70–99)
Potassium: 3.6 mmol/L (ref 3.5–5.1)
Sodium: 139 mmol/L (ref 135–145)

## 2019-11-20 MED ORDER — ACETAMINOPHEN 500 MG PO TABS
1000.0000 mg | ORAL_TABLET | Freq: Once | ORAL | Status: AC
  Administered 2019-11-20: 21:00:00 1000 mg via ORAL
  Filled 2019-11-20: qty 2

## 2019-11-20 NOTE — ED Triage Notes (Addendum)
Pt arrived post ictal from [place] by EMS, post seizure. Pt fell backwards, hitting posterior head. Pt A&O to self and place, not situation or time. Pt denies hx/o seizure or seizure medication. CBG by EMS= 83

## 2019-11-20 NOTE — ED Notes (Signed)
Husband, Zain Riopel, is currently waiting outside; cell phone number [phone]

## 2019-11-20 NOTE — ED Notes (Signed)
Pt offered food, pt denies wanting it at this time. Pt denies any needs

## 2019-11-20 NOTE — Discharge Instructions (Signed)
As we discussed please do not drive, go up on any ladders, go in any pulls or put yourself in any dangerous positions if you are to have another seizure until you are cleared by the neurologist.  Please follow-up closely with a neurologist.  If you are to have any further seizure-like activity I would like you to return to the emergency department. Please seek medical attention for any high fevers, chest pain, shortness of breath, change in behavior, persistent vomiting, bloody stool or any other new or concerning symptoms.

## 2019-11-20 NOTE — ED Provider Notes (Signed)
[facility] Emergency Department Provider Note    ____________________________________________   I have reviewed the triage vital signs and the nursing notes.   HISTORY  Chief Complaint Seizures   History limited by: Disorientation  HPI Brittany Nguyen is a 54 y.o. female who presents to the emergency department today after an apparent seizure.  Patient is coming from pizza restuarant  The patient does not remember any events of today.  She does not remember going to the restaurant. She cannot remember what she had for dinner last night. She does complain of headache. Denies any history of seizures.    Records reviewed. Per medical record review patient has a history of COPD, CHF, cancer.   Past Medical History:  Diagnosis Date  . Asthma 2013  . 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 CHF (congestive heart failure) ([place])   . COPD (chronic obstructive pulmonary disease) ([place])   . Diverticulitis 2010  . DVT (deep venous thrombosis) ([place])   . Endometriosis 1990  . Heart disease 2013  . Hx MRSA infection   . Iron deficiency   . Lump or mass in breast   . Restless leg   . Stomach cancer [facility])     Patient Active Problem List   Diagnosis Date Noted  . Iron deficiency anemia 03/27/2018  . Anemia 03/26/2018  . Abdominal pain 02/20/2018  . Unstable angina ([place])   . Encounter for anticoagulation discussion and counseling   . C. difficile colitis 01/14/2018  . GIB (gastrointestinal bleeding) 01/08/2018  . SOB (shortness of breath) 11/01/2017  . Influenza A 10/29/2017  . Acute respiratory failure ([place]) 09/24/2017  . Sepsis ([place]) 07/03/2017  . COPD (chronic obstructive pulmonary disease) ([place]) 04/26/2017  . Community acquired pneumonia 04/25/2017  . Chronic systolic heart failure ([place]) 02/03/2017  . Hypotension 02/03/2017  . Chest pain 12/09/2016  . RLS (restless legs syndrome) 12/09/2016  . Cough, persistent 11/22/2015  .  Hypokalemia 11/22/2015  . Leukocytosis 11/22/2015  . Dyspnea 11/21/2015  . Respiratory distress 09/19/2015  . Breast pain 01/11/2013  . Hx MRSA infection   . Lump or mass in breast   . GOITER, MULTINODULAR 01/20/2009  . HYPOGLYCEMIA, UNSPECIFIED 01/20/2009  . GERD 01/20/2009  . DIVERTICULITIS OF COLON 01/20/2009    Past Surgical History:  Procedure Laterality Date  . ABDOMINAL HYSTERECTOMY     age 41  . BREAST BIOPSY Right 2014   benign  . CARDIAC DEFIBRILLATOR PLACEMENT    . CARDIAC DEFIBRILLATOR PLACEMENT    . CESAREAN SECTION    . CHOLECYSTECTOMY    . COLECTOMY    . COLONOSCOPY WITH PROPOFOL N/A 02/22/2018   Procedure: COLONOSCOPY WITH PROPOFOL;  Surgeon: Toledo, Benay Pike, MD;  Location: [facility] ENDOSCOPY;  Service: Gastroenterology;  Laterality: N/A;  . ESOPHAGOGASTRODUODENOSCOPY (EGD) WITH PROPOFOL N/A 02/22/2018   Procedure: ESOPHAGOGASTRODUODENOSCOPY (EGD) WITH PROPOFOL;  Surgeon: Toledo, Benay Pike, MD;  Location: [facility] ENDOSCOPY;  Service: Gastroenterology;  Laterality: N/A;  . NASAL SINUS SURGERY  2012  . OOPHORECTOMY      Prior to Admission medications   Medication Sig Start Date End Date Taking? Authorizing Provider  albuterol (ACCUNEB) 1.25 MG/3ML nebulizer solution Take 3 mLs by nebulization every 6 (six) hours as needed for wheezing. 11/09/17   [provider]  albuterol (VENTOLIN HFA) 108 (90 Base) MCG/ACT inhaler Inhale 2 puffs into the lungs every 6 (six) hours as needed for shortness of breath. 08/28/16   [provider]  beclomethasone (QVAR)  80 MCG/ACT inhaler Inhale 2 puffs into the lungs 2 (two) times daily. 06/05/17 10/03/18  Laverle Hobby, MD  beclomethasone (QVAR) 80 MCG/ACT inhaler Inhale 1 puff into the lungs 2 (two) times daily. 06/12/17   [provider]  busPIRone (BUSPAR) 15 MG tablet Take 15 mg by mouth 2 (two) times daily. 05/08/18   [provider]  doxycycline (VIBRAMYCIN) 100 MG capsule Take 1 capsule (100 mg  total) by mouth 2 (two) times daily. Patient not taking: Reported on 05/02/2019 02/27/19   Johnn Hai, PA-C  famotidine (PEPCID) 20 MG tablet Take 1 tablet (20 mg total) by mouth 2 (two) times daily. 03/15/19   Carrie Mew, MD  fentaNYL (DURAGESIC) 50 MCG/HR Place 1 patch onto the skin every 3 (three) days. 10/08/18   Epifanio Lesches, MD  furosemide (LASIX) 40 MG tablet Take 80 mg by mouth 2 (two) times daily.  07/09/18   [provider]  hydrOXYzine (ATARAX/VISTARIL) 25 MG tablet Take 25 mg by mouth every 6 (six) hours as needed for itching. 04/13/19   [provider]  levothyroxine (SYNTHROID, LEVOTHROID) 150 MCG tablet Take 150 mcg by mouth daily before breakfast.    [provider]  losartan (COZAAR) 25 MG tablet Take 25 mg by mouth daily.     [provider]  metoprolol succinate (TOPROL-XL) 50 MG 24 hr tablet Take 50 mg by mouth 2 (two) times daily. 01/25/19   [provider]  mirtazapine (REMERON) 15 MG tablet Take 15 mg by mouth at bedtime. 04/26/19 05/26/24  [provider]  nitroGLYCERIN (NITROSTAT) 0.4 MG SL tablet Place 1 tablet (0.4 mg total) under the tongue every 5 (five) minutes as needed for chest pain. 01/24/17   Henreitta Leber, MD  omeprazole (PRILOSEC) 40 MG capsule Take 40 mg by mouth 2 (two) times daily. 10/19/18   [provider]  oxyCODONE (OXY IR/ROXICODONE) 5 MG immediate release tablet Take 5-10 mg by mouth every 4 (four) hours as needed for pain. 04/11/19   [provider]  oxyCODONE-acetaminophen (PERCOCET) 5-325 MG tablet Take 1 tablet by mouth every 6 (six) hours as needed for severe pain. Patient not taking: Reported on 05/02/2019 02/27/19   Johnn Hai, PA-C  Pancrelipase, Lip-Prot-Amyl, (CREON) 24000-76000 units CPEP Take 3-4 capsules by mouth 3 (three) times daily with meals. 04/01/19   [provider]  potassium chloride SA (KLOR-CON M20) 20 MEQ tablet Take 1 tablet (20 mEq  total) by mouth 2 (two) times daily for 7 days. 07/09/19 07/16/19  Carrie Mew, MD  promethazine (PHENERGAN) 25 MG tablet Take 1 tablet (25 mg total) by mouth every 6 (six) hours as needed. 07/09/19   Carrie Mew, MD  rOPINIRole (REQUIP) 4 MG tablet Take 4 mg by mouth at bedtime. 01/30/19   [provider]  spironolactone (ALDACTONE) 50 MG tablet Take 50 mg by mouth daily. 11/26/18   [provider]  sucralfate (CARAFATE) 1 g tablet Take 1 tablet (1 g total) by mouth 4 (four) times daily. 03/15/19   Carrie Mew, MD  vitamin B-12 (CYANOCOBALAMIN) 1000 MCG tablet Take 1,000 mcg by mouth daily.    [provider]    Allergies Contrast media [iodinated diagnostic agents], Lidocaine, Metrizamide, Penicillins, Isosorbide nitrate, Latex, Ondansetron, Zofran [ondansetron hcl], Hydromorphone, Povidone-iodine, and Pulmicort [budesonide]  Family History  Problem Relation Age of Onset  . Cancer Mother 30       ovarian  . CAD Mother   . Cancer Father 48  brain  . CAD Father   . Cancer Daughter 18       skin  . Cancer Maternal Aunt 24       breast  . Leukemia Paternal Grandfather     Social History Social History   Tobacco Use  . Smoking status: Never Smoker  . Smokeless tobacco: Never Used  Substance Use Topics  . Alcohol use: No  . Drug use: No    Review of Systems Constitutional: No fever/chills Eyes: No visual changes. ENT: No sore throat. Cardiovascular: Denies chest pain. Respiratory: Denies shortness of breath. Gastrointestinal: No abdominal pain.  No nausea, no vomiting.  No diarrhea.   Genitourinary: Negative for dysuria. Musculoskeletal: Negative for back pain. Skin: Negative for rash. Neurological: Positive for headache.  ____________________________________________   PHYSICAL EXAM:  VITAL SIGNS: ED Triage Vitals [11/20/19 1912]  Enc Vitals Group     BP 103/69     Pulse Rate (!) 107     Resp 18     Temp 98.9 F (37.2  C)     Temp Source Oral     SpO2 98 %   Constitutional: Awake and alert. Not oriented to events or date.  Eyes: Conjunctivae are normal.  ENT      Head: Normocephalic and atraumatic.      Nose: No congestion/rhinnorhea.      Mouth/Throat: Mucous membranes are moist.      Neck: No stridor. Hematological/Lymphatic/Immunilogical: No cervical lymphadenopathy. Cardiovascular: Normal rate, regular rhythm.  No murmurs, rubs, or gallops.  Respiratory: Normal respiratory effort without tachypnea nor retractions. Breath sounds are clear and equal bilaterally. No wheezes/rales/rhonchi. Gastrointestinal: Soft and non tender. No rebound. No guarding.  Genitourinary: Deferred Musculoskeletal: Normal range of motion in all extremities. No lower extremity edema. Neurologic:  Disorientation. Moving all extremities.  Skin:  Skin is warm, dry and intact. No rash noted. Psychiatric: Mood and affect are normal. Speech and behavior are normal. Patient exhibits appropriate insight and judgment.  ____________________________________________    LABS (pertinent positives/negatives)  CBC wbc 13.7, hgb 14.2, plt 298 BMP na 139, k 3.6, glu 104, cr 1.11  ____________________________________________   EKG  I, Nance Pear, attending physician, personally viewed and interpreted this EKG  EKG Time: 1918 Rate: 104 Rhythm: sinus rhythm with frequent pac, PVC Axis: left axis deviation Intervals: qtc 499 QRS: narrow ST changes: no st elevation Impression: abnormal ekg  ____________________________________________    RADIOLOGY  CT head No acute abnormality  ____________________________________________   PROCEDURES  Procedures  ____________________________________________   INITIAL IMPRESSION / ASSESSMENT AND PLAN / ED COURSE  Pertinent labs & imaging results that were available during my care of the patient were reviewed by me and considered in my medical decision making (see chart for  details).   Patient presented to the emergency department today after an apparent seizure while at a restaurant.  Initially patient cannot remember what it happened.  However during her stay here in the emergency department her memory did return and she could remember being at the restaurant with her sister-in-law.  The patient's work-up including head CT without any concerning findings.  At this point I did discuss with patient she might of had a seizure.  Since it was only one and work-up here negative I do think is reasonable for patient be discharged home without antiepileptics.  I did however discuss with patient seizure precautions and importance of following up with neurology.  ____________________________________________   FINAL CLINICAL IMPRESSION(S) / ED DIAGNOSES  Final  diagnoses:  Seizure-like activity [facility])     Note: This dictation was prepared with Dragon dictation. Any transcriptional errors that result from this process are unintentional     Nance Pear, MD 11/20/19 2300

## 2019-12-09 IMAGING — CT CT ABD-PELV W/O
2 of 4 series · 16 of 46 positions shown, 18 images · non-contrast
Comparison: 05/25/2018

CLINICAL DATA: 52-year-old with acute abdominal pain. Rectal
bleeding. History of gastric lesion.

EXAM:
CT ABDOMEN AND PELVIS WITHOUT CONTRAST
TECHNIQUE: Multidetector CT imaging of the abdomen and pelvis was performed
following the standard protocol without IV contrast.

[Series 2: routine abd/pel wo · axial · 0.81mm/px · z∈[-512,-87]mm · 13 of 93 slices shown, 15 images]
[im 4/93  soft-tissue]
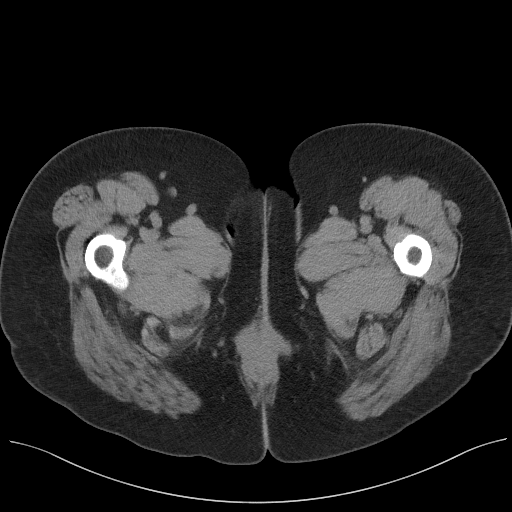
[im 4/93  bone]
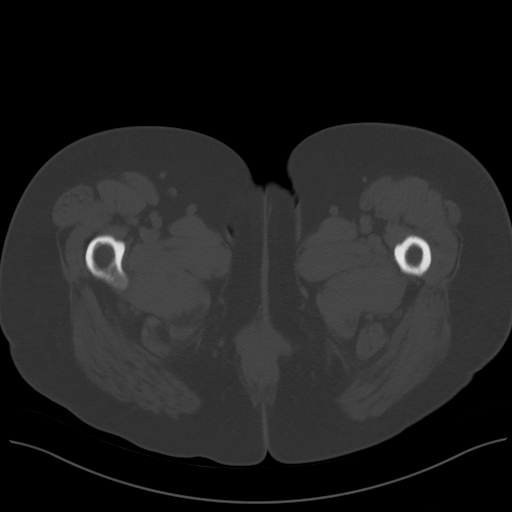
[im 12/93  soft-tissue]
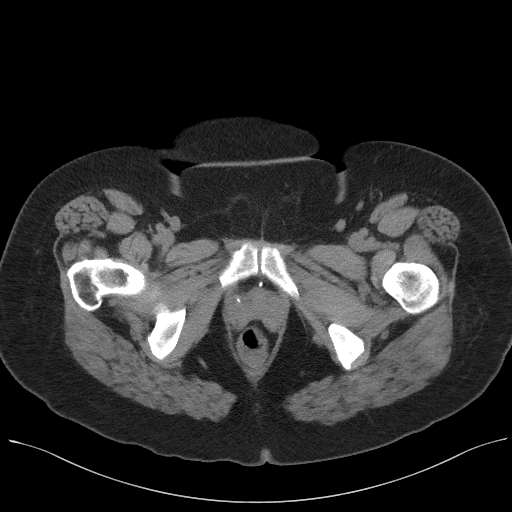
[im 20/93  soft-tissue]
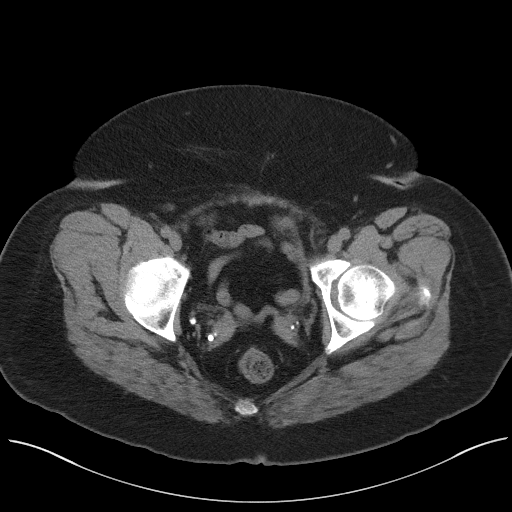
[im 27/93  soft-tissue]
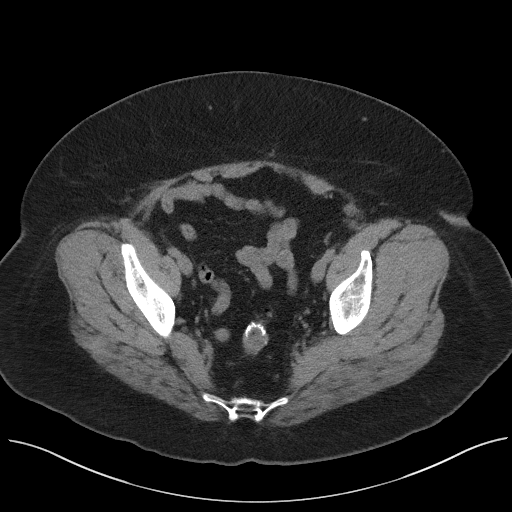
[im 31/93  soft-tissue]
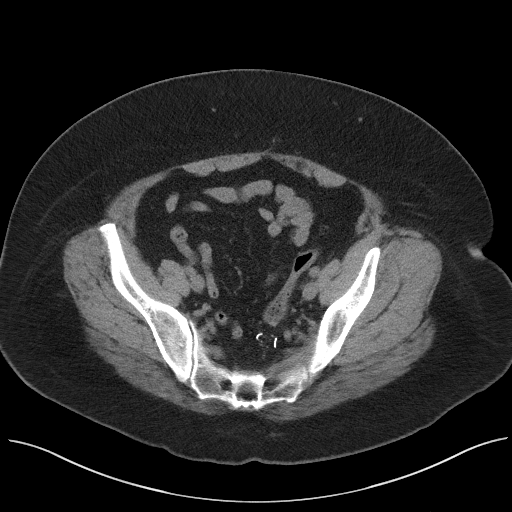
[im 39/93  soft-tissue]
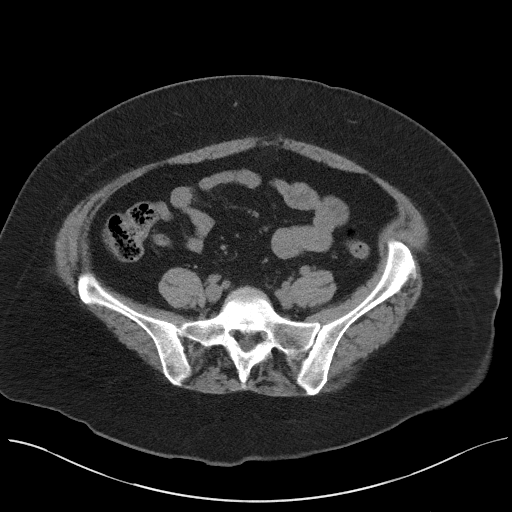
[im 47/93  soft-tissue]
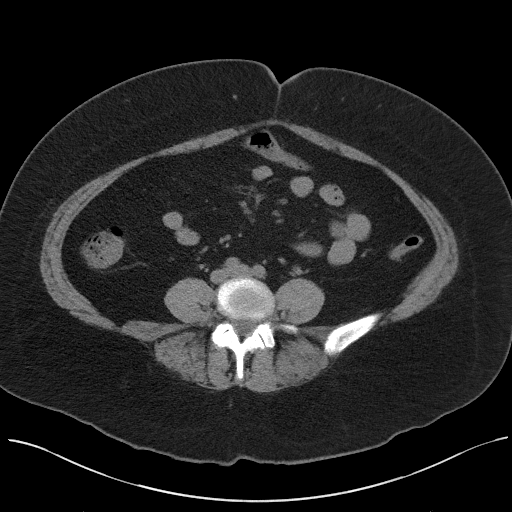
[im 54/93  soft-tissue]
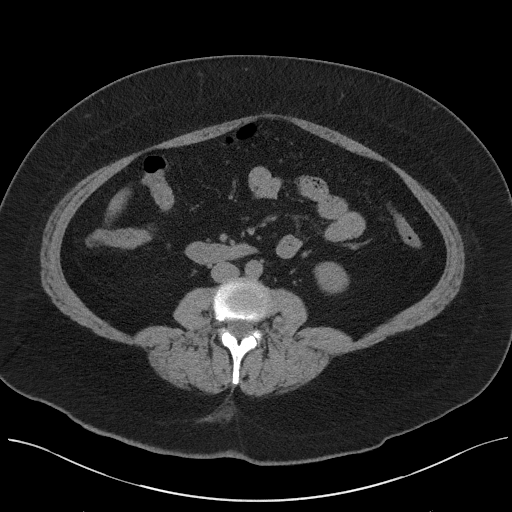
[im 62/93  soft-tissue]
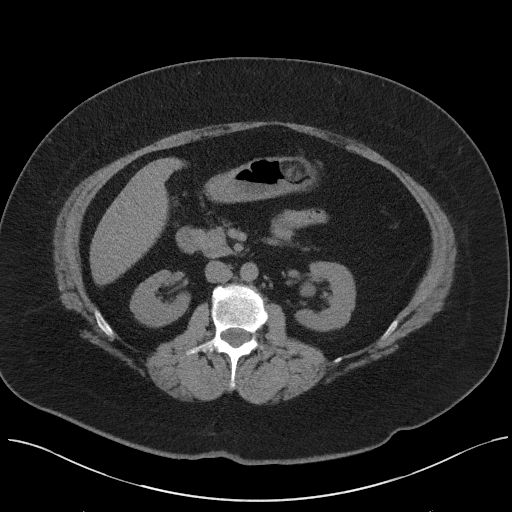
[im 62/93  bone]
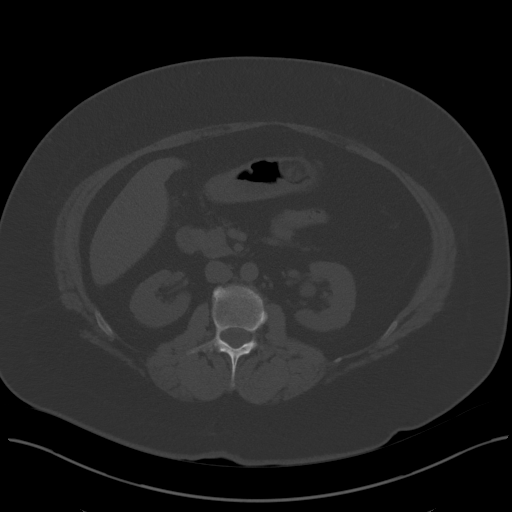
[im 66/93  soft-tissue]
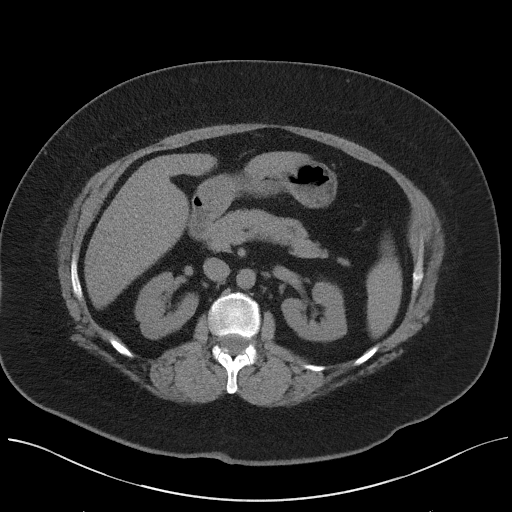
[im 73/93  soft-tissue]
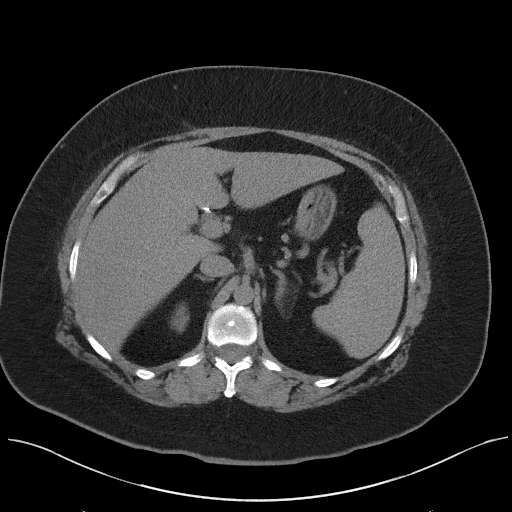
[im 81/93  soft-tissue]
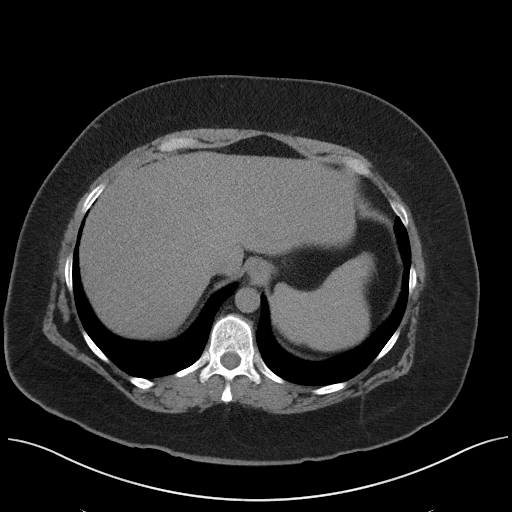
[im 89/93  soft-tissue]
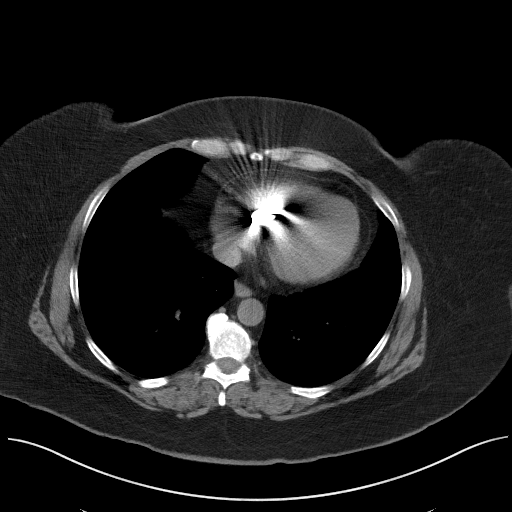

[Series 5: coronal st · coronal · 0.80mm/px · 3 of 104 slices shown]
[im 35/104  soft-tissue]
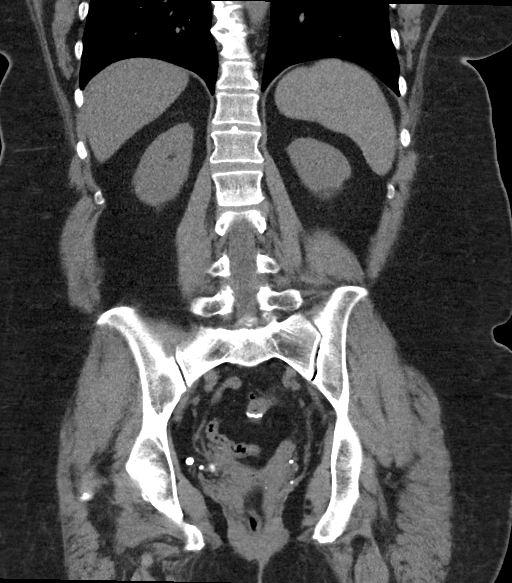
[im 46/104  soft-tissue]
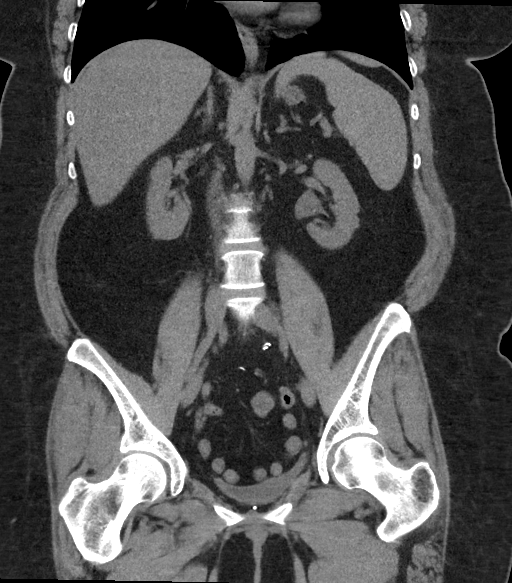
[im 58/104  soft-tissue]
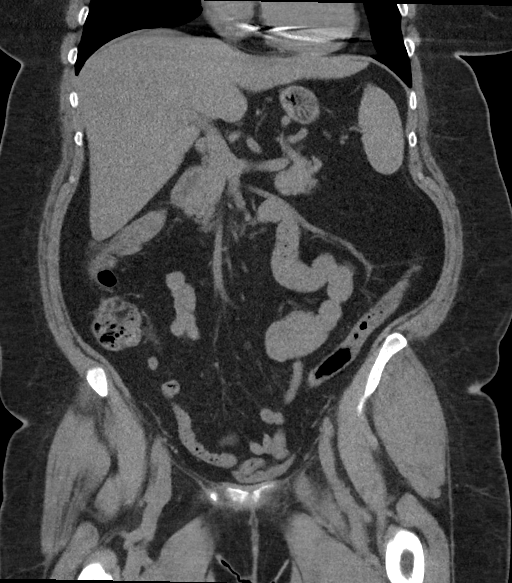

[16 of 46 positions shown; findings below may reference images not displayed]

FINDINGS: Lower chest: Lung bases are clear. No pleural effusions. Cardiac
defibrillator lead in the right ventricle.

Hepatobiliary: Normal appearance of the liver. Cholecystectomy. No
biliary dilatation.

Pancreas: Unremarkable. No pancreatic ductal dilatation or
surrounding inflammatory changes.

Spleen: Normal in size without focal abnormality.

Adrenals/Urinary Tract: Normal adrenal glands. Urinary bladder is
decompressed. Normal appearance of both kidneys without
hydronephrosis. Small left extrarenal pelvis is stable. Negative for
kidney stones.

Stomach/Bowel: Again noted is a low-density lesion along the
anterior aspect of the gastric antrum that measures up to 2.5 cm and
minimally changed from the prior exams. This appears to represent
the previously biopsied gastric lesion. No evidence for gastric
distension or inflammation. Normal appearance of the duodenum.
Normal appearance of the small bowel structures. Surgical
anastomosis in the sigmoid colon. Appendix is not confidently
identified.

Vascular/Lymphatic: Vascular structures are unremarkable without
atherosclerotic calcifications. No significant lymph node
enlargement in the abdomen or pelvis.

Reproductive: Status post hysterectomy. No adnexal masses.

Other: Surgical clips around the sigmoid mesocolon possibly related
to a lymph node dissection. Negative for free fluid. Small midline
ventral hernia containing fat below the umbilicus. Negative for free
air.

Musculoskeletal: No acute bone abnormality.
IMPRESSION: 1. No acute abnormality in the abdomen or pelvis.
2. Stable appearance of the low-density gastric lesion. No acute
inflammatory changes involving the stomach.
3. Stable postoperative changes as described.

## 2019-12-21 IMAGING — CT NM PET NOPR SKULL BASE TO THIGH
1 of 10 series · 1 of 25 positions shown · non-contrast
Comparison: None.

CLINICAL DATA: Well differentiated neuroendocrine tumor (grade 1)
submucosal location in the stomach. Some history of diarrhea.
Postcholecystectomy.

EXAM:
NUCLEAR MEDICINE PET SKULL BASE TO THIGH
TECHNIQUE: 5.75 mCi Ga 68 DOTATATE was injected intravenously. Full-ring PET
imaging was performed from the skull base to thigh after the
radiotracer. CT data was obtained and used for attenuation
correction and anatomic localization.

[Series 3: ct wb 5.0 b30f · axial · 5.0mm · 0.98mm/px · 1 of 329 slices shown]
[im 329/329  brain]
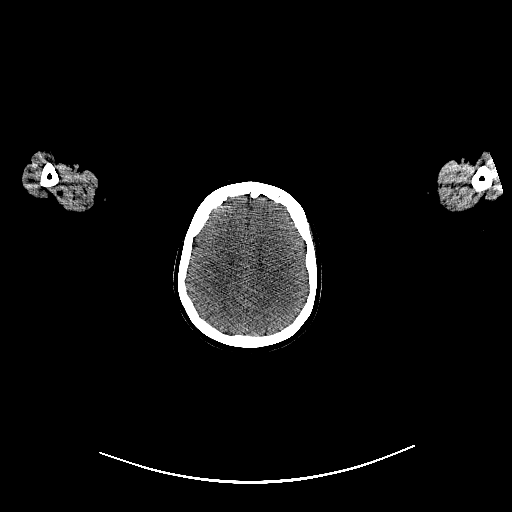

[1 of 25 positions shown; findings below may reference images not displayed]

FINDINGS: NECK

No radiotracer activity in neck lymph nodes.

Incidental CT findings: None

CHEST

No radiotracer accumulation within mediastinal or hilar lymph nodes.
No suspicious pulmonary nodules on the CT scan.

Incidental CT finding:None

ABDOMEN/PELVIS

Within the antral region of the stomach there is a rounded 2.3 cm
lesion with some fat attenuation not changed from comparison exams.
This lesion does not have significant radiotracer centrally.
Moderate activity on the periphery of the lesion (less than
typically expected for well differentiated neuroendocrine tumor.).
No clear focal activity within the stomach.

There is no evidence abnormal radiotracer activity in periportal
lymph nodes are gastrohepatic ligament lymph nodes. No enlarged
lymph nodes are identified.

No abnormal radiotracer activity within the liver. No more distant
adenopathy present within the abdomen or pelvis.

Physiologic activity noted in the liver, spleen, adrenal glands and
kidneys.

Incidental CT findings:None

SKELETON

No focal activity to suggest skeletal metastasis.

Incidental CT findings:None
IMPRESSION: 1. Minimal peripheral activity associated with the rounded fat
containing lesion in the gastric antrum presumably the biopsied
neuroendocrine tumor. Activity less than typically expected for well
differentiated neuroendocrine tumor.
2. No evidence of nodal metastatic disease, soft tissue metastasis,
or skeletal metastasis.

## 2020-01-02 ENCOUNTER — Emergency Department: Payer: Medicare Other

## 2020-01-02 ENCOUNTER — Emergency Department
Admission: EM | Admit: 2020-01-02 | Discharge: 2020-01-02 | Disposition: A | Discharge status: Home / Self Care | Payer: Medicare Other | Attending: Student | Admitting: Student

## 2020-01-02 ENCOUNTER — Other Ambulatory Visit: Payer: Self-pay

## 2020-01-02 ENCOUNTER — Encounter: Payer: Self-pay | Admitting: Emergency Medicine

## 2020-01-02 DIAGNOSIS — Z91040 Latex allergy status: Secondary | ICD-10-CM | POA: Diagnosis not present

## 2020-01-02 DIAGNOSIS — Z79899 Other long term (current) drug therapy: Secondary | ICD-10-CM | POA: Diagnosis not present

## 2020-01-02 DIAGNOSIS — F1310 Sedative, hypnotic or anxiolytic abuse, uncomplicated: Secondary | ICD-10-CM | POA: Insufficient documentation

## 2020-01-02 DIAGNOSIS — R531 Weakness: Secondary | ICD-10-CM

## 2020-01-02 DIAGNOSIS — I5022 Chronic systolic (congestive) heart failure: Secondary | ICD-10-CM | POA: Insufficient documentation

## 2020-01-02 DIAGNOSIS — Z85028 Personal history of other malignant neoplasm of stomach: Secondary | ICD-10-CM | POA: Diagnosis not present

## 2020-01-02 DIAGNOSIS — J449 Chronic obstructive pulmonary disease, unspecified: Secondary | ICD-10-CM | POA: Insufficient documentation

## 2020-01-02 DIAGNOSIS — R112 Nausea with vomiting, unspecified: Secondary | ICD-10-CM | POA: Insufficient documentation

## 2020-01-02 DIAGNOSIS — R5383 Other fatigue: Secondary | ICD-10-CM | POA: Diagnosis present

## 2020-01-02 DIAGNOSIS — I110 Hypertensive heart disease with heart failure: Secondary | ICD-10-CM | POA: Diagnosis not present

## 2020-01-02 DIAGNOSIS — R0789 Other chest pain: Secondary | ICD-10-CM | POA: Diagnosis not present

## 2020-01-02 DIAGNOSIS — R630 Anorexia: Secondary | ICD-10-CM | POA: Insufficient documentation

## 2020-01-02 DIAGNOSIS — F1110 Opioid abuse, uncomplicated: Secondary | ICD-10-CM | POA: Insufficient documentation

## 2020-01-02 DIAGNOSIS — Z20822 Contact with and (suspected) exposure to covid-19: Secondary | ICD-10-CM | POA: Diagnosis not present

## 2020-01-02 LAB — URINALYSIS, COMPLETE (UACMP) WITH MICROSCOPIC
Bilirubin Urine: NEGATIVE
Glucose, UA: NEGATIVE mg/dL
Hgb urine dipstick: NEGATIVE
Ketones, ur: NEGATIVE mg/dL
Leukocytes,Ua: NEGATIVE
Nitrite: NEGATIVE
Protein, ur: NEGATIVE mg/dL
Specific Gravity, Urine: 1.013 (ref 1.005–1.030)
pH: 5 (ref 5.0–8.0)

## 2020-01-02 LAB — BASIC METABOLIC PANEL
Anion gap: 9 (ref 5–15)
BUN: 11 mg/dL (ref 6–20)
CO2: 31 mmol/L (ref 22–32)
Calcium: 8.6 mg/dL — ABNORMAL LOW (ref 8.9–10.3)
Chloride: 97 mmol/L — ABNORMAL LOW (ref 98–111)
Creatinine, Ser: 0.9 mg/dL (ref 0.44–1.00)
GFR calc Af Amer: >60 mL/min (ref >60)
GFR calc non Af Amer: >60 mL/min (ref >60)
Glucose, Bld: 116 mg/dL — ABNORMAL HIGH (ref 70–99)
Potassium: 3.1 mmol/L — ABNORMAL LOW (ref 3.5–5.1)
Sodium: 137 mmol/L (ref 135–145)

## 2020-01-02 LAB — URINE DRUG SCREEN, QUALITATIVE (ARMC ONLY)
Amphetamines, Ur Screen: NOT DETECTED
Barbiturates, Ur Screen: NOT DETECTED
Benzodiazepine, Ur Scrn: POSITIVE — AB
Cannabinoid 50 Ng, Ur ~~LOC~~: NOT DETECTED
Cocaine Metabolite,Ur ~~LOC~~: NOT DETECTED
MDMA (Ecstasy)Ur Screen: NOT DETECTED
Methadone Scn, Ur: NOT DETECTED
Opiate, Ur Screen: POSITIVE — AB
Phencyclidine (PCP) Ur S: NOT DETECTED
Tricyclic, Ur Screen: NOT DETECTED

## 2020-01-02 LAB — CBC
HCT: 39 % (ref 36.0–46.0)
Hemoglobin: 13.6 g/dL (ref 12.0–15.0)
MCH: 30.8 pg (ref 26.0–34.0)
MCHC: 34.9 g/dL (ref 30.0–36.0)
MCV: 88.2 fL (ref 80.0–100.0)
Platelets: 272 10*3/uL (ref 150–400)
RBC: 4.42 MIL/uL (ref 3.87–5.11)
RDW: 12.8 % (ref 11.5–15.5)
WBC: 8.5 10*3/uL (ref 4.0–10.5)
nRBC: 0 % (ref 0.0–0.2)

## 2020-01-02 LAB — TSH: TSH: 31.155 u[IU]/mL — ABNORMAL HIGH (ref 0.350–4.500)

## 2020-01-02 LAB — PREGNANCY, URINE: Preg Test, Ur: NEGATIVE

## 2020-01-02 LAB — TROPONIN I (HIGH SENSITIVITY)
Troponin I (High Sensitivity): <2 ng/L (ref <18)
Troponin I (High Sensitivity): <2 ng/L (ref <18)

## 2020-01-02 LAB — RESPIRATORY PANEL BY RT PCR (FLU A&B, COVID)
Influenza A by PCR: NEGATIVE
Influenza B by PCR: NEGATIVE
SARS Coronavirus 2 by RT PCR: NEGATIVE

## 2020-01-02 LAB — BRAIN NATRIURETIC PEPTIDE: B Natriuretic Peptide: 31 pg/mL (ref 0.0–100.0)

## 2020-01-02 LAB — T4, FREE: Free T4: 0.83 ng/dL (ref 0.61–1.12)

## 2020-01-02 LAB — MAGNESIUM: Magnesium: 2.3 mg/dL (ref 1.7–2.4)

## 2020-01-02 MED ORDER — ALUM & MAG HYDROXIDE-SIMETH 200-200-20 MG/5ML PO SUSP
30.0000 mL | Freq: Once | ORAL | Status: AC
  Administered 2020-01-02: 30 mL via ORAL
  Filled 2020-01-02: qty 30

## 2020-01-02 MED ORDER — SODIUM CHLORIDE 0.9 % IV BOLUS
250.0000 mL | Freq: Once | INTRAVENOUS | Status: AC
  Administered 2020-01-02: 15:00:00 250 mL via INTRAVENOUS

## 2020-01-02 MED ORDER — OXYCODONE HCL 5 MG PO TABS
5.0000 mg | ORAL_TABLET | Freq: Once | ORAL | Status: AC
  Administered 2020-01-02: 5 mg via ORAL
  Filled 2020-01-02: qty 1

## 2020-01-02 MED ORDER — PROMETHAZINE HCL 25 MG/ML IJ SOLN
6.2500 mg | Freq: Once | INTRAMUSCULAR | Status: AC
  Administered 2020-01-02: 6.25 mg via INTRAVENOUS
  Filled 2020-01-02: qty 1

## 2020-01-02 MED ORDER — METOCLOPRAMIDE HCL 5 MG/ML IJ SOLN
5.0000 mg | Freq: Once | INTRAMUSCULAR | Status: AC
  Administered 2020-01-02: 5 mg via INTRAVENOUS
  Filled 2020-01-02: qty 2

## 2020-01-02 MED ORDER — ACETAMINOPHEN 500 MG PO TABS
1000.0000 mg | ORAL_TABLET | Freq: Once | ORAL | Status: AC
  Administered 2020-01-02: 1000 mg via ORAL
  Filled 2020-01-02: qty 2

## 2020-01-02 MED ORDER — POTASSIUM CHLORIDE CRYS ER 20 MEQ PO TBCR
40.0000 meq | EXTENDED_RELEASE_TABLET | Freq: Once | ORAL | Status: AC
  Administered 2020-01-02: 40 meq via ORAL
  Filled 2020-01-02: qty 2

## 2020-01-02 NOTE — ED Notes (Signed)
Zofran not ordered due to allergy alert, Dr. Joan Mayans informed

## 2020-01-02 NOTE — ED Notes (Signed)
Pt speaking with this RN in NAD, A&Ox4. Pt reports pacemaker has been "going off" to tell the pt she has been going in and out of a-fib. Pt reports feeling weak and tired for the last 2 days. Was told by doctor that her troponin was elevated and to come to the ED.

## 2020-01-02 NOTE — ED Triage Notes (Signed)
Patient reports she has been feeling weak and tired for the past few days. States she was seen at PCP yesterday and found to have an abnormal ekg and abnormal labs. Also states her EF has dropped from 35% to 20%.

## 2020-01-02 NOTE — Discharge Instructions (Addendum)
Thank you for letting us take care of you in the emergency department today.  ° °Please continue to take any regular, prescribed medications.  ° °Please follow up with: °- Your primary care doctor to review your ER visit and follow up on your symptoms.  ° °Please return to the ER for any new or worsening symptoms.  ° °

## 2020-01-02 NOTE — ED Provider Notes (Signed)
[facility] Emergency Department Provider Note  ____________________________________________   First MD Initiated Contact with Patient 01/02/20 1249     (approximate)  I have reviewed the triage vital signs and the nursing notes.  History  Chief Complaint Weakness, Abnormal ECG, and Chest Pain    HPI Brittany Nguyen is a 54 y.o. female with significant past medical history as below, including significant systolic HF (EF 123456), AICD/pacemaker, carcinoid tumor of the stomach, s/p thyroidectomy who presents to the emergency department for 2 days of generalized fatigue and weakness.  Patient states that she has been sleeping for a significant portion of the day over the last 2 days.  Has had a decreased appetite and has had decreased PO intake because of this.  Reports some mild nausea and a few episode of vomiting, non-bloody.  Yesterday had some vague chest discomfort, none currently.  No particular inciting event that occurred 2 days ago.  Reports a moderate, tension type headache at present. States chest pain no longer present. Headache moderate, no radiation, no alleviating/aggravating component.   On chart review, it appears patient did undergo EUS/upper endoscopy and colonoscopy with [facility] on 12/13/19.  Had several polyps removed on colonoscopy.  On endoscopy gastric lipoma was noted and mucosal lesion was biopsied.     Past Medical Hx Past Medical History:  Diagnosis Date  . Asthma 2013  . 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 CHF (congestive heart failure) ([place])   . COPD (chronic obstructive pulmonary disease) ([place])   . Diverticulitis 2010  . DVT (deep venous thrombosis) ([place])   . Endometriosis 1990  . Heart disease 2013  . Hx MRSA infection   . Iron deficiency   . Lump or mass in breast   . Restless leg   . Stomach cancer [facility])     Problem List Patient Active Problem List   Diagnosis Date Noted  . Iron deficiency anemia  03/27/2018  . Anemia 03/26/2018  . Abdominal pain 02/20/2018  . Unstable angina ([place])   . Encounter for anticoagulation discussion and counseling   . C. difficile colitis 01/14/2018  . GIB (gastrointestinal bleeding) 01/08/2018  . SOB (shortness of breath) 11/01/2017  . Influenza A 10/29/2017  . Acute respiratory failure ([place]) 09/24/2017  . Sepsis ([place]) 07/03/2017  . COPD (chronic obstructive pulmonary disease) ([place]) 04/26/2017  . Community acquired pneumonia 04/25/2017  . Chronic systolic heart failure ([place]) 02/03/2017  . Hypotension 02/03/2017  . Chest pain 12/09/2016  . RLS (restless legs syndrome) 12/09/2016  . Cough, persistent 11/22/2015  . Hypokalemia 11/22/2015  . Leukocytosis 11/22/2015  . Dyspnea 11/21/2015  . Respiratory distress 09/19/2015  . Breast pain 01/11/2013  . Hx MRSA infection   . Lump or mass in breast   . GOITER, MULTINODULAR 01/20/2009  . HYPOGLYCEMIA, UNSPECIFIED 01/20/2009  . GERD 01/20/2009  . DIVERTICULITIS OF COLON 01/20/2009    Past Surgical Hx Past Surgical History:  Procedure Laterality Date  . ABDOMINAL HYSTERECTOMY     age 63  . BREAST BIOPSY Right 2014   benign  . CARDIAC DEFIBRILLATOR PLACEMENT    . CARDIAC DEFIBRILLATOR PLACEMENT    . CESAREAN SECTION    . CHOLECYSTECTOMY    . COLECTOMY    . COLONOSCOPY WITH PROPOFOL N/A 02/22/2018   Procedure: COLONOSCOPY WITH PROPOFOL;  Surgeon: Toledo, Benay Pike, MD;  Location: [facility] ENDOSCOPY;  Service: Gastroenterology;  Laterality: N/A;  . ESOPHAGOGASTRODUODENOSCOPY (EGD) WITH PROPOFOL N/A 02/22/2018   Procedure:  ESOPHAGOGASTRODUODENOSCOPY (EGD) WITH PROPOFOL;  Surgeon: Toledo, Benay Pike, MD;  Location: [facility] ENDOSCOPY;  Service: Gastroenterology;  Laterality: N/A;  . NASAL SINUS SURGERY  2012  . OOPHORECTOMY      Medications Prior to Admission medications   Medication Sig Start Date End Date Taking? Authorizing Provider  albuterol (ACCUNEB) 1.25 MG/3ML nebulizer solution Take 3 mLs by  nebulization every 6 (six) hours as needed for wheezing. 11/09/17   [provider]  albuterol (VENTOLIN HFA) 108 (90 Base) MCG/ACT inhaler Inhale 2 puffs into the lungs every 6 (six) hours as needed for shortness of breath. 08/28/16   [provider]  beclomethasone (QVAR) 80 MCG/ACT inhaler Inhale 2 puffs into the lungs 2 (two) times daily. 06/05/17 10/03/18  Laverle Hobby, MD  beclomethasone (QVAR) 80 MCG/ACT inhaler Inhale 1 puff into the lungs 2 (two) times daily. 06/12/17   [provider]  busPIRone (BUSPAR) 15 MG tablet Take 15 mg by mouth 2 (two) times daily. 05/08/18   [provider]  doxycycline (VIBRAMYCIN) 100 MG capsule Take 1 capsule (100 mg total) by mouth 2 (two) times daily. Patient not taking: Reported on 05/02/2019 02/27/19   Johnn Hai, PA-C  famotidine (PEPCID) 20 MG tablet Take 1 tablet (20 mg total) by mouth 2 (two) times daily. 03/15/19   Carrie Mew, MD  fentaNYL (DURAGESIC) 50 MCG/HR Place 1 patch onto the skin every 3 (three) days. 10/08/18   Epifanio Lesches, MD  furosemide (LASIX) 40 MG tablet Take 80 mg by mouth 2 (two) times daily.  07/09/18   [provider]  hydrOXYzine (ATARAX/VISTARIL) 25 MG tablet Take 25 mg by mouth every 6 (six) hours as needed for itching. 04/13/19   [provider]  levothyroxine (SYNTHROID, LEVOTHROID) 150 MCG tablet Take 150 mcg by mouth daily before breakfast.    [provider]  losartan (COZAAR) 25 MG tablet Take 25 mg by mouth daily.     [provider]  metoprolol succinate (TOPROL-XL) 50 MG 24 hr tablet Take 50 mg by mouth 2 (two) times daily. 01/25/19   [provider]  mirtazapine (REMERON) 15 MG tablet Take 15 mg by mouth at bedtime. 04/26/19 05/26/24  [provider]  nitroGLYCERIN (NITROSTAT) 0.4 MG SL tablet Place 1 tablet (0.4 mg total) under the tongue every 5 (five) minutes as needed for chest pain. 01/24/17   Henreitta Leber, MD    omeprazole (PRILOSEC) 40 MG capsule Take 40 mg by mouth 2 (two) times daily. 10/19/18   [provider]  oxyCODONE (OXY IR/ROXICODONE) 5 MG immediate release tablet Take 5-10 mg by mouth every 4 (four) hours as needed for pain. 04/11/19   [provider]  oxyCODONE-acetaminophen (PERCOCET) 5-325 MG tablet Take 1 tablet by mouth every 6 (six) hours as needed for severe pain. Patient not taking: Reported on 05/02/2019 02/27/19   Johnn Hai, PA-C  Pancrelipase, Lip-Prot-Amyl, (CREON) 24000-76000 units CPEP Take 3-4 capsules by mouth 3 (three) times daily with meals. 04/01/19   [provider]  potassium chloride SA (KLOR-CON M20) 20 MEQ tablet Take 1 tablet (20 mEq total) by mouth 2 (two) times daily for 7 days. 07/09/19 07/16/19  Carrie Mew, MD  promethazine (PHENERGAN) 25 MG tablet Take 1 tablet (25 mg total) by mouth every 6 (six) hours as needed. 07/09/19   Carrie Mew, MD  rOPINIRole (REQUIP) 4 MG tablet Take 4 mg by mouth at bedtime. 01/30/19   [provider]  spironolactone (ALDACTONE) 50 MG  tablet Take 50 mg by mouth daily. 11/26/18   [provider]  sucralfate (CARAFATE) 1 g tablet Take 1 tablet (1 g total) by mouth 4 (four) times daily. 03/15/19   Carrie Mew, MD  vitamin B-12 (CYANOCOBALAMIN) 1000 MCG tablet Take 1,000 mcg by mouth daily.    [provider]    Allergies Contrast media [iodinated diagnostic agents], Lidocaine, Metrizamide, Penicillins, Isosorbide nitrate, Latex, Ondansetron, Zofran [ondansetron hcl], Hydromorphone, Povidone-iodine, and Pulmicort [budesonide]  Family Hx Family History  Problem Relation Age of Onset  . Cancer Mother 30       ovarian  . CAD Mother   . Cancer Father 84       brain  . CAD Father   . Cancer Daughter 18       skin  . Cancer Maternal Aunt 63       breast  . Leukemia Paternal Grandfather     Social Hx Social History   Tobacco Use  . Smoking status: Never Smoker   . Smokeless tobacco: Never Used  Substance Use Topics  . Alcohol use: No  . Drug use: No     Review of Systems  Constitutional: Negative for fever. Negative for chills.  Positive for generalized weakness, fatigue. Eyes: Negative for visual changes. ENT: Negative for sore throat. Cardiovascular: Negative for chest pain. Respiratory: Negative for shortness of breath. Gastrointestinal: Negative for nausea. Negative for vomiting.  Genitourinary: Negative for dysuria. Musculoskeletal: Negative for leg swelling. Skin: Negative for rash. Neurological: Positive for headaches.   Physical Exam  Vital Signs: ED Triage Vitals  Enc Vitals Group     BP 01/02/20 1153 106/78     Pulse Rate 01/02/20 1153 92     Resp 01/02/20 1153 16     Temp 01/02/20 1153 98.2 F (36.8 C)     Temp Source 01/02/20 1153 Oral     SpO2 01/02/20 1153 96 %     Weight 01/02/20 1150 200 lb (90.7 kg)     Height 01/02/20 1150 5\' 5"  (1.651 m)     Head Circumference --      Peak Flow --      Pain Score 01/02/20 1150 8     Pain Loc --      Pain Edu? --      Excl. in [place]? --     Constitutional: Alert and oriented.  Appears tired, but in no acute distress. Head: Normocephalic. Atraumatic. Eyes: Conjunctivae clear. Sclera anicteric. Pupils equal and symmetric. Nose: No masses or lesions. No congestion or rhinorrhea. Mouth/Throat: Wearing mask.  Neck: No stridor. Trachea midline.  Cardiovascular: Normal rate, regular rhythm. Extremities well perfused. Respiratory: Normal respiratory effort.  Lungs CTAB. Gastrointestinal: Soft. Non-distended. Non-tender.  Genitourinary: Deferred. Musculoskeletal: No lower extremity edema. No deformities. Neurologic:  Normal speech and language. No gross focal or lateralizing neurologic deficits are appreciated.  Skin: Skin is warm, dry and intact. No rash noted. Psychiatric: Mood and affect are appropriate for situation.  EKG  Personally reviewed and interpreted by myself.    Date: 01/02/20 Time: 1145 Rate: 95 Rhythm: Sinus Axis: LAD, seen prior Intervals: PR 208 ms, QTC 490 ms Low voltage throughout No STEMI    Radiology  Personally reviewed available imaging myself.   CXR - IMPRESSION:  Pacemaker leads attached to right atrium and right ventricle. Heart  size normal. Lungs clear.    Procedures  Procedure(s) performed (including critical care):  .1-3 Lead EKG Interpretation Performed by: Lilia Pro., MD Authorized by:  Lilia Pro., MD     ECG rate assessment: normal     Rhythm: sinus rhythm   Comments:     Indication: Generalized weakness Impression: Normal sinus rhythm     Initial Impression / Assessment and Plan / MDM / ED Course  54 y.o. female who presents to the ED for generalized weakness, fatigue over the last few days.  History as above.  Ddx: anemia, electrolyte abnormality, UTI, thyroid disease, COVID, atypical ACS, HF exac, thyroid dx  Will plan for labs, urine studies, COVID swab, imaging, cardiac monitoring.  Symptom control and reassess.  Clinical Course as of Jan 01 1899  Thu Jan 02, 2020  1840 Work-up is essentially unremarkable.  This includes negative COVID.  Negative UA.  UDS with opiates and benzodiazepines, both of which she is prescribed.  Troponin x 2 negative.  BNP within normal limits.  CXR negative.  TSH elevated, which is anticipated in the setting of her thyroidectomy, free T4 within normal limits.  Chemistry reveals very mildly decreased potassium and chloride (perhaps in setting of her decreased intake), received small bolus of fluids and potassium repletion.  Symptoms controlled with IV and PO medications.  Patient voices feeling much improved, tolerated by mouth, and feels comfortable with discharge at this time.  Advised PCP follow-up and given return precautions.   [SM]    Clinical Course User Index [SM] Lilia Pro., MD     _______________________________   As part of my medical  decision making I have reviewed available labs, radiology tests, reviewed old records/performed chart review.   Final Clinical Impression(s) / ED Diagnosis  Final diagnoses:  Generalized weakness       Note:  This document was prepared using Dragon voice recognition software and may include unintentional dictation errors.   Lilia Pro., MD 01/02/20 1900

## 2020-01-02 NOTE — ED Notes (Signed)
Pt c/o nausea, Dr. Joan Mayans informed, verbal given for 4mg  zofran IV stat

## 2020-01-02 NOTE — ED Notes (Signed)
PO challenge. Pt given crackers and water. Denies nausea at this time

## 2020-01-13 ENCOUNTER — Other Ambulatory Visit: Payer: Self-pay

## 2020-01-13 ENCOUNTER — Emergency Department
Admission: EM | Admit: 2020-01-13 | Discharge: 2020-01-13 | Disposition: A | Discharge status: Home / Self Care | Payer: Medicare Other | Attending: Emergency Medicine | Admitting: Emergency Medicine

## 2020-01-13 ENCOUNTER — Encounter: Payer: Self-pay | Admitting: Emergency Medicine

## 2020-01-13 DIAGNOSIS — Z79899 Other long term (current) drug therapy: Secondary | ICD-10-CM | POA: Insufficient documentation

## 2020-01-13 DIAGNOSIS — J449 Chronic obstructive pulmonary disease, unspecified: Secondary | ICD-10-CM | POA: Diagnosis not present

## 2020-01-13 DIAGNOSIS — Y92007 Garden or yard of unspecified non-institutional (private) residence as the place of occurrence of the external cause: Secondary | ICD-10-CM | POA: Insufficient documentation

## 2020-01-13 DIAGNOSIS — J45909 Unspecified asthma, uncomplicated: Secondary | ICD-10-CM | POA: Insufficient documentation

## 2020-01-13 DIAGNOSIS — Z950 Presence of cardiac pacemaker: Secondary | ICD-10-CM | POA: Insufficient documentation

## 2020-01-13 DIAGNOSIS — X58XXXA Exposure to other specified factors, initial encounter: Secondary | ICD-10-CM | POA: Diagnosis not present

## 2020-01-13 DIAGNOSIS — T63311A Toxic effect of venom of black widow spider, accidental (unintentional), initial encounter: Secondary | ICD-10-CM | POA: Diagnosis not present

## 2020-01-13 DIAGNOSIS — Y93H2 Activity, gardening and landscaping: Secondary | ICD-10-CM | POA: Diagnosis not present

## 2020-01-13 DIAGNOSIS — S61532A Puncture wound without foreign body of left wrist, initial encounter: Secondary | ICD-10-CM | POA: Diagnosis not present

## 2020-01-13 DIAGNOSIS — Z23 Encounter for immunization: Secondary | ICD-10-CM | POA: Diagnosis not present

## 2020-01-13 DIAGNOSIS — Y999 Unspecified external cause status: Secondary | ICD-10-CM | POA: Diagnosis not present

## 2020-01-13 LAB — CK: Total CK: 91 U/L (ref 38–234)

## 2020-01-13 LAB — CBC WITH DIFFERENTIAL/PLATELET
Abs Immature Granulocytes: 0.05 10*3/uL (ref 0.00–0.07)
Basophils Absolute: 0.1 10*3/uL (ref 0.0–0.1)
Basophils Relative: 0 %
Eosinophils Absolute: 0.1 10*3/uL (ref 0.0–0.5)
Eosinophils Relative: 1 %
HCT: 42.3 % (ref 36.0–46.0)
Hemoglobin: 14.7 g/dL (ref 12.0–15.0)
Immature Granulocytes: 0 %
Lymphocytes Relative: 27 %
Lymphs Abs: 3.1 10*3/uL (ref 0.7–4.0)
MCH: 30.8 pg (ref 26.0–34.0)
MCHC: 34.8 g/dL (ref 30.0–36.0)
MCV: 88.7 fL (ref 80.0–100.0)
Monocytes Absolute: 0.5 10*3/uL (ref 0.1–1.0)
Monocytes Relative: 4 %
Neutro Abs: 7.6 10*3/uL (ref 1.7–7.7)
Neutrophils Relative %: 68 %
Platelets: 331 10*3/uL (ref 150–400)
RBC: 4.77 MIL/uL (ref 3.87–5.11)
RDW: 12.5 % (ref 11.5–15.5)
WBC: 11.3 10*3/uL — ABNORMAL HIGH (ref 4.0–10.5)
nRBC: 0 % (ref 0.0–0.2)

## 2020-01-13 LAB — COMPREHENSIVE METABOLIC PANEL
ALT: 23 U/L (ref 0–44)
AST: 22 U/L (ref 15–41)
Albumin: 4.2 g/dL (ref 3.5–5.0)
Alkaline Phosphatase: 109 U/L (ref 38–126)
Anion gap: 12 (ref 5–15)
BUN: 10 mg/dL (ref 6–20)
CO2: 30 mmol/L (ref 22–32)
Calcium: 9 mg/dL (ref 8.9–10.3)
Chloride: 98 mmol/L (ref 98–111)
Creatinine, Ser: 0.91 mg/dL (ref 0.44–1.00)
GFR calc Af Amer: >60 mL/min (ref >60)
GFR calc non Af Amer: >60 mL/min (ref >60)
Glucose, Bld: 136 mg/dL — ABNORMAL HIGH (ref 70–99)
Potassium: 3.4 mmol/L — ABNORMAL LOW (ref 3.5–5.1)
Sodium: 140 mmol/L (ref 135–145)
Total Bilirubin: 0.6 mg/dL (ref 0.3–1.2)
Total Protein: 8.1 g/dL (ref 6.5–8.1)

## 2020-01-13 MED ORDER — MORPHINE SULFATE (PF) 4 MG/ML IV SOLN
4.0000 mg | Freq: Once | INTRAVENOUS | Status: AC
  Administered 2020-01-13: 15:00:00 4 mg via INTRAVENOUS
  Filled 2020-01-13: qty 1

## 2020-01-13 MED ORDER — MORPHINE SULFATE (PF) 2 MG/ML IV SOLN
2.0000 mg | Freq: Once | INTRAVENOUS | Status: AC
  Administered 2020-01-13: 2 mg via INTRAVENOUS
  Filled 2020-01-13: qty 1

## 2020-01-13 MED ORDER — TETANUS-DIPHTH-ACELL PERTUSSIS 5-2.5-18.5 LF-MCG/0.5 IM SUSP
0.5000 mL | Freq: Once | INTRAMUSCULAR | Status: AC
  Administered 2020-01-13: 15:00:00 0.5 mL via INTRAMUSCULAR
  Filled 2020-01-13: qty 0.5

## 2020-01-13 MED ORDER — LORAZEPAM 2 MG/ML IJ SOLN: 1.0000 mg | Freq: Once | INTRAMUSCULAR | Status: DC | PRN

## 2020-01-13 MED ORDER — LORAZEPAM 2 MG/ML IJ SOLN
1.0000 mg | Freq: Once | INTRAMUSCULAR | Status: AC
  Administered 2020-01-13: 15:00:00 1 mg via INTRAVENOUS
  Filled 2020-01-13: qty 1

## 2020-01-13 MED ORDER — MORPHINE SULFATE (PF) 4 MG/ML IV SOLN: 4.0000 mg | Freq: Once | INTRAVENOUS | Status: DC | PRN

## 2020-01-13 MED ORDER — LORAZEPAM 2 MG/ML IJ SOLN
0.5000 mg | Freq: Once | INTRAMUSCULAR | Status: AC
  Administered 2020-01-13: 0.5 mg via INTRAVENOUS
  Filled 2020-01-13: qty 1

## 2020-01-13 MED ORDER — DIPHENHYDRAMINE HCL 50 MG/ML IJ SOLN: 50.0000 mg | Freq: Once | INTRAMUSCULAR | Status: AC

## 2020-01-13 MED ORDER — DIPHENHYDRAMINE HCL 50 MG/ML IJ SOLN
INTRAMUSCULAR | Status: AC
  Administered 2020-01-13: 50 mg via INTRAVENOUS
  Filled 2020-01-13: qty 1

## 2020-01-13 NOTE — ED Triage Notes (Signed)
Was pulling weeds in garden a nd bit by a black and red spider.  Her husband thought it looked like a black widow. Nausea/vomiting, chills and pain aching in right arm gioing up the arm from the bite on her right hand.

## 2020-01-13 NOTE — ED Notes (Signed)
Pt signed paper copy do d/c consent

## 2020-01-13 NOTE — ED Provider Notes (Signed)
[facility] Emergency Department Provider Note  ____________________________________________   None    (approximate)  I have reviewed the triage vital signs and the nursing notes.   HISTORY  Chief Complaint Insect Bite    HPI Brittany Nguyen is a 54 y.o. 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  Patient reports that she was gardening when she noticed a spider on her right wrist and felt a stinging sensation.  The husband saw the spider and thought it could be a black widow given it was black with a little bit of red on it.  This occurred at 10:30 AM today.  She is having some pain at the right wrist with feeling like it is tight but no obvious swelling.  The pain is been constant, nothing makes better, nothing makes it worse.  The pain is now radiating up into the right shoulder.  She also endorses feeling cold and having some nausea.  She denies being bitten by anything in the past or receiving antivenom in the past.  Denies any fevers, redness            Past Medical History:  Diagnosis Date  . Asthma 2013  . 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 CHF (congestive heart failure) ([place])   . COPD (chronic obstructive pulmonary disease) ([place])   . Diverticulitis 2010  . DVT (deep venous thrombosis) ([place])   . Endometriosis 1990  . Heart disease 2013  . Hx MRSA infection   . Iron deficiency   . Lump or mass in breast   . Restless leg   . 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 Iron deficiency anemia 03/27/2018  . Anemia 03/26/2018  . Abdominal pain 02/20/2018  . Unstable angina ([place])   . Encounter for anticoagulation discussion and counseling   . C. difficile colitis 01/14/2018  . GIB (gastrointestinal bleeding) 01/08/2018  . SOB (shortness of breath) 11/01/2017  . Influenza A 10/29/2017  . Acute  respiratory failure ([place]) 09/24/2017  . Sepsis ([place]) 07/03/2017  . COPD (chronic obstructive pulmonary disease) ([place]) 04/26/2017  . Community acquired pneumonia 04/25/2017  . Chronic systolic heart failure ([place]) 02/03/2017  . Hypotension 02/03/2017  . Chest pain 12/09/2016  . RLS (restless legs syndrome) 12/09/2016  . Cough, persistent 11/22/2015  . Hypokalemia 11/22/2015  . Leukocytosis 11/22/2015  . Dyspnea 11/21/2015  . Respiratory distress 09/19/2015  . Breast pain 01/11/2013  . Hx MRSA infection   . Lump or mass in breast   . GOITER, MULTINODULAR 01/20/2009  . HYPOGLYCEMIA, UNSPECIFIED 01/20/2009  . GERD 01/20/2009  . DIVERTICULITIS OF COLON 01/20/2009    Past Surgical History:  Procedure Laterality Date  . ABDOMINAL HYSTERECTOMY     age 67  . BREAST BIOPSY Right 2014   benign  . CARDIAC DEFIBRILLATOR PLACEMENT    . CARDIAC DEFIBRILLATOR PLACEMENT    . CESAREAN SECTION    . CHOLECYSTECTOMY    . COLECTOMY    . COLONOSCOPY WITH PROPOFOL N/A 02/22/2018   Procedure: COLONOSCOPY WITH PROPOFOL;  Surgeon: Toledo, Benay Pike, MD;  Location: [facility] ENDOSCOPY;  Service: Gastroenterology;  Laterality: N/A;  . ESOPHAGOGASTRODUODENOSCOPY (EGD) WITH PROPOFOL N/A 02/22/2018   Procedure: ESOPHAGOGASTRODUODENOSCOPY (EGD) WITH PROPOFOL;  Surgeon: Toledo, Benay Pike, MD;  Location: [facility] ENDOSCOPY;  Service: Gastroenterology;  Laterality: N/A;  . NASAL SINUS  SURGERY  2012  . OOPHORECTOMY      Prior to Admission medications   Medication Sig Start Date End Date Taking? Authorizing Provider  albuterol (ACCUNEB) 1.25 MG/3ML nebulizer solution Take 3 mLs by nebulization every 6 (six) hours as needed for wheezing. 11/09/17   [provider]  albuterol (VENTOLIN HFA) 108 (90 Base) MCG/ACT inhaler Inhale 2 puffs into the lungs every 6 (six) hours as needed for shortness of breath. 08/28/16   [provider]  beclomethasone (QVAR) 80 MCG/ACT inhaler Inhale 2 puffs into the lungs 2  (two) times daily. 06/05/17 10/03/18  Laverle Hobby, MD  beclomethasone (QVAR) 80 MCG/ACT inhaler Inhale 1 puff into the lungs 2 (two) times daily. 06/12/17   [provider]  busPIRone (BUSPAR) 15 MG tablet Take 15 mg by mouth 2 (two) times daily. 05/08/18   [provider]  doxycycline (VIBRAMYCIN) 100 MG capsule Take 1 capsule (100 mg total) by mouth 2 (two) times daily. Patient not taking: Reported on 05/02/2019 02/27/19   Johnn Hai, PA-C  famotidine (PEPCID) 20 MG tablet Take 1 tablet (20 mg total) by mouth 2 (two) times daily. 03/15/19   Carrie Mew, MD  fentaNYL (DURAGESIC) 50 MCG/HR Place 1 patch onto the skin every 3 (three) days. 10/08/18   Epifanio Lesches, MD  furosemide (LASIX) 40 MG tablet Take 80 mg by mouth 2 (two) times daily.  07/09/18   [provider]  hydrOXYzine (ATARAX/VISTARIL) 25 MG tablet Take 25 mg by mouth every 6 (six) hours as needed for itching. 04/13/19   [provider]  levothyroxine (SYNTHROID, LEVOTHROID) 150 MCG tablet Take 150 mcg by mouth daily before breakfast.    [provider]  losartan (COZAAR) 25 MG tablet Take 25 mg by mouth daily.     [provider]  metoprolol succinate (TOPROL-XL) 50 MG 24 hr tablet Take 50 mg by mouth 2 (two) times daily. 01/25/19   [provider]  mirtazapine (REMERON) 15 MG tablet Take 15 mg by mouth at bedtime. 04/26/19 05/26/24  [provider]  nitroGLYCERIN (NITROSTAT) 0.4 MG SL tablet Place 1 tablet (0.4 mg total) under the tongue every 5 (five) minutes as needed for chest pain. 01/24/17   Henreitta Leber, MD  omeprazole (PRILOSEC) 40 MG capsule Take 40 mg by mouth 2 (two) times daily. 10/19/18   [provider]  oxyCODONE (OXY IR/ROXICODONE) 5 MG immediate release tablet Take 5-10 mg by mouth every 4 (four) hours as needed for pain. 04/11/19   [provider]  oxyCODONE-acetaminophen (PERCOCET) 5-325 MG tablet Take 1 tablet by  mouth every 6 (six) hours as needed for severe pain. Patient not taking: Reported on 05/02/2019 02/27/19   Johnn Hai, PA-C  Pancrelipase, Lip-Prot-Amyl, (CREON) 24000-76000 units CPEP Take 3-4 capsules by mouth 3 (three) times daily with meals. 04/01/19   [provider]  potassium chloride SA (KLOR-CON M20) 20 MEQ tablet Take 1 tablet (20 mEq total) by mouth 2 (two) times daily for 7 days. 07/09/19 07/16/19  Carrie Mew, MD  promethazine (PHENERGAN) 25 MG tablet Take 1 tablet (25 mg total) by mouth every 6 (six) hours as needed. 07/09/19   Carrie Mew, MD  rOPINIRole (REQUIP) 4 MG tablet Take 4 mg by mouth at bedtime. 01/30/19   [provider]  spironolactone (ALDACTONE) 50 MG tablet Take 50 mg by mouth daily. 11/26/18   [provider]  sucralfate (CARAFATE) 1 g tablet Take 1 tablet (1 g total) by  mouth 4 (four) times daily. 03/15/19   Carrie Mew, MD  vitamin B-12 (CYANOCOBALAMIN) 1000 MCG tablet Take 1,000 mcg by mouth daily.    [provider]    Allergies Contrast media [iodinated diagnostic agents], Lidocaine, Metrizamide, Penicillins, Isosorbide nitrate, Latex, Ondansetron, Zofran [ondansetron hcl], Hydromorphone, Povidone-iodine, and Pulmicort [budesonide]  Family History  Problem Relation Age of Onset  . Cancer Mother 30       ovarian  . CAD Mother   . Cancer Father 56       brain  . CAD Father   . Cancer Daughter 18       skin  . Cancer Maternal Aunt 78       breast  . Leukemia Paternal Grandfather     Social History Social History   Tobacco Use  . Smoking status: Never Smoker  . Smokeless tobacco: Never Used  Substance Use Topics  . Alcohol use: No  . Drug use: No      Review of Systems Constitutional: No fever/chills positive feeling cold Eyes: No visual changes. ENT: No sore throat. Cardiovascular: Denies chest pain. Respiratory: Denies shortness of breath. Gastrointestinal: No abdominal pain.  Of  nausea,Positive no vomiting.  No diarrhea.  No constipation. Genitourinary: Negative for dysuria. Musculoskeletal: Negative for back pain.  Positive bite mark to the right wrist Skin: Negative for rash. Neurological: Negative for headaches, focal weakness or numbness. All other ROS negative ____________________________________________   PHYSICAL EXAM:  VITAL SIGNS: ED Triage Vitals  Enc Vitals Group     BP 01/13/20 1247 105/73     Pulse Rate 01/13/20 1247 (!) 129     Resp 01/13/20 1247 20     Temp 01/13/20 1247 98.2 F (36.8 C)     Temp Source 01/13/20 1247 Oral     SpO2 01/13/20 1247 98 %     Weight 01/13/20 1250 202 lb (91.6 kg)     Height 01/13/20 1250 5\' 5"  (1.651 m)     Head Circumference --      Peak Flow --      Pain Score 01/13/20 1249 8     Pain Loc --      Pain Edu? --      Excl. in [place]? --     Constitutional: Alert and oriented. Well appearing and in no acute distress. Eyes: Conjunctivae are normal. EOMI. Head: Atraumatic. Nose: No congestion/rhinnorhea. Mouth/Throat: Mucous membranes are moist.   Neck: No stridor. Trachea Midline. FROM Cardiovascular: Tachycardic, regular rhythm. Grossly normal heart sounds.  Good peripheral circulation. Respiratory: Normal respiratory effort.  No retractions. Lungs CTAB. Gastrointestinal: Soft and nontender. No distention. No abdominal bruits.  Musculoskeletal: No lower extremity tenderness nor edema.  No joint effusions.  1 puncture wound noted to the right wrist without any erythema or warmth.  No obvious swelling good distal pulse. Neurologic:  Normal speech and language. No gross focal neurologic deficits are appreciated.  Skin:  Skin is warm, dry and intact. No rash noted. Psychiatric: Mood and affect are normal. Speech and behavior are normal. GU: Deferred   ____________________________________________   LABS (all labs ordered are listed, but only abnormal results are displayed)  Labs Reviewed  CBC WITH  DIFFERENTIAL/PLATELET - Abnormal; Notable for the following components:      Result Value   WBC 11.3 (*)    All other components within normal limits  CK  COMPREHENSIVE METABOLIC PANEL   ____________________________________________   ED ECG REPORT I, Vanessa [place], the attending physician, personally  viewed and interpreted this ECG.   sinus tachycardia rate of 103, no st elevation, no twi, normal intervals, low voltage ____________________________________________   PROCEDURES  Procedure(s) performed (including Critical Care):  Procedures   ____________________________________________   INITIAL IMPRESSION / ASSESSMENT AND PLAN / ED COURSE  Brittany Nguyen was evaluated in Emergency Department on 01/13/2020 for the symptoms described in the history of present illness. She was evaluated in the context of the global COVID-19 pandemic, which necessitated consideration that the patient might be at risk for infection with the SARS-CoV-2 virus that causes COVID-19. Institutional protocols and algorithms that pertain to the evaluation of patients at risk for COVID-19 are in a state of rapid change based on information released by regulatory bodies including the CDC and federal and state organizations. These policies and algorithms were followed during the patient's care in the ED.    Patient is a 54 year old who comes in with concern for black widow bite.  Patient has a puncture wound to the right wrist.  No signs of erythema to suggest cellulitis, abscess, septic joint.  Will discuss with poison control for further recommendations.  Poison control recommended wound care which patient is already thoroughly washed out the wound, updating tetanus which patient is unclear of her last tetanus, benzos and morphine to help with the pain.  Typically they will not to antivenom unless very severe reactions.  Recommend observing for 6 hours.  Re-evaluated pt still having symptoms but not progressing.  No obvious swelling noted. Will give higher dose of morphine and benzo given toleratd smaller dose. Pt is on a lot of narcotics at home.   Will add on one more PRN dose.   Pt handed off to oncoming team pending, labs, re-evaluation, d/w PC for disposition.     ____________________________________________   FINAL CLINICAL IMPRESSION(S) / ED DIAGNOSES   Final diagnoses:  Black widow spider bite, accidental or unintentional, initial encounter      MEDICATIONS GIVEN DURING THIS VISIT:  Medications  morphine 4 MG/ML injection 4 mg (has no administration in time range)  LORazepam (ATIVAN) injection 1 mg (has no administration in time range)  morphine 2 MG/ML injection 2 mg (2 mg Intravenous Given 01/13/20 1320)  LORazepam (ATIVAN) injection 0.5 mg (0.5 mg Intravenous Given 01/13/20 1320)  Tdap (BOOSTRIX) injection 0.5 mL (0.5 mLs Intramuscular Given 01/13/20 1441)  morphine 4 MG/ML injection 4 mg (4 mg Intravenous Given 01/13/20 1437)  LORazepam (ATIVAN) injection 1 mg (1 mg Intravenous Given 01/13/20 1437)     ED Discharge Orders    None       Note:  This document was prepared using Dragon voice recognition software and may include unintentional dictation errors.   Vanessa Sedgwick, MD 01/13/20 [phone]

## 2020-01-13 NOTE — ED Provider Notes (Signed)
-----------------------------------------   3:54 PM on 01/13/2020 -----------------------------------------  Patient is experiencing a mild rash to her chest and neck.  States is itching.  The rash appears more excoriated than anything.  No obvious hives.  However given the itching we will treat with Benadryl IV and continue to closely monitor.  Patient has received morphine Ativan and tetanus booster.  Patient is on similar medications at home has had morphine sulfate in the past without issue has had Ativan at home without issue.  She does not know last time she had a tetanus booster was.  I discussed with the patient she should inform her doctor regarding possible tetanus booster allergy.  We will continue to closely monitor.  Patient's lab work is reassuringly normal.  Overall the patient appears well.  No distress.  ----------------------------------------- 5:21 PM on 01/13/2020 -----------------------------------------  Patient continues to appear well.  Does state some nausea at times but has not vomited.  Spoke to poison control.  We will discharge the patient home with her home pain medication.   Harvest Dark, MD 01/13/20 1721

## 2020-01-23 ENCOUNTER — Encounter: Payer: Self-pay | Admitting: Emergency Medicine

## 2020-01-23 ENCOUNTER — Emergency Department: Payer: Medicare Other

## 2020-01-23 ENCOUNTER — Other Ambulatory Visit: Payer: Self-pay

## 2020-01-23 ENCOUNTER — Observation Stay
Admission: EM | Admit: 2020-01-23 | Discharge: 2020-01-24 | Discharge status: Against Medical Advice | Payer: Medicare Other | Attending: Internal Medicine | Admitting: Internal Medicine

## 2020-01-23 DIAGNOSIS — Z8249 Family history of ischemic heart disease and other diseases of the circulatory system: Secondary | ICD-10-CM | POA: Diagnosis not present

## 2020-01-23 DIAGNOSIS — Z20822 Contact with and (suspected) exposure to covid-19: Secondary | ICD-10-CM | POA: Diagnosis not present

## 2020-01-23 DIAGNOSIS — Z8614 Personal history of Methicillin resistant Staphylococcus aureus infection: Secondary | ICD-10-CM | POA: Diagnosis not present

## 2020-01-23 DIAGNOSIS — J449 Chronic obstructive pulmonary disease, unspecified: Secondary | ICD-10-CM | POA: Diagnosis present

## 2020-01-23 DIAGNOSIS — I5022 Chronic systolic (congestive) heart failure: Secondary | ICD-10-CM | POA: Diagnosis not present

## 2020-01-23 DIAGNOSIS — Z86718 Personal history of other venous thrombosis and embolism: Secondary | ICD-10-CM | POA: Insufficient documentation

## 2020-01-23 DIAGNOSIS — I5042 Chronic combined systolic (congestive) and diastolic (congestive) heart failure: Secondary | ICD-10-CM | POA: Diagnosis present

## 2020-01-23 DIAGNOSIS — I428 Other cardiomyopathies: Secondary | ICD-10-CM

## 2020-01-23 DIAGNOSIS — I2510 Atherosclerotic heart disease of native coronary artery without angina pectoris: Secondary | ICD-10-CM | POA: Diagnosis not present

## 2020-01-23 DIAGNOSIS — R569 Unspecified convulsions: Secondary | ICD-10-CM | POA: Diagnosis not present

## 2020-01-23 DIAGNOSIS — Z85028 Personal history of other malignant neoplasm of stomach: Secondary | ICD-10-CM | POA: Diagnosis not present

## 2020-01-23 DIAGNOSIS — Z794 Long term (current) use of insulin: Secondary | ICD-10-CM | POA: Insufficient documentation

## 2020-01-23 DIAGNOSIS — Z7951 Long term (current) use of inhaled steroids: Secondary | ICD-10-CM | POA: Insufficient documentation

## 2020-01-23 DIAGNOSIS — I110 Hypertensive heart disease with heart failure: Secondary | ICD-10-CM | POA: Insufficient documentation

## 2020-01-23 DIAGNOSIS — G2581 Restless legs syndrome: Secondary | ICD-10-CM | POA: Diagnosis not present

## 2020-01-23 DIAGNOSIS — Z79890 Hormone replacement therapy (postmenopausal): Secondary | ICD-10-CM | POA: Insufficient documentation

## 2020-01-23 DIAGNOSIS — E785 Hyperlipidemia, unspecified: Secondary | ICD-10-CM | POA: Diagnosis not present

## 2020-01-23 DIAGNOSIS — Z79891 Long term (current) use of opiate analgesic: Secondary | ICD-10-CM

## 2020-01-23 DIAGNOSIS — Z79899 Other long term (current) drug therapy: Secondary | ICD-10-CM | POA: Diagnosis not present

## 2020-01-23 DIAGNOSIS — E890 Postprocedural hypothyroidism: Secondary | ICD-10-CM | POA: Diagnosis not present

## 2020-01-23 DIAGNOSIS — E119 Type 2 diabetes mellitus without complications: Secondary | ICD-10-CM | POA: Diagnosis not present

## 2020-01-23 DIAGNOSIS — Z95810 Presence of automatic (implantable) cardiac defibrillator: Secondary | ICD-10-CM | POA: Diagnosis not present

## 2020-01-23 DIAGNOSIS — E039 Hypothyroidism, unspecified: Secondary | ICD-10-CM

## 2020-01-23 DIAGNOSIS — G40919 Epilepsy, unspecified, intractable, without status epilepticus: Secondary | ICD-10-CM

## 2020-01-23 LAB — CBC WITH DIFFERENTIAL/PLATELET
Abs Immature Granulocytes: 0.1 10*3/uL — ABNORMAL HIGH (ref 0.00–0.07)
Basophils Absolute: 0.1 10*3/uL (ref 0.0–0.1)
Basophils Relative: 0 %
Eosinophils Absolute: 0.2 10*3/uL (ref 0.0–0.5)
Eosinophils Relative: 1 %
HCT: 41.4 % (ref 36.0–46.0)
Hemoglobin: 14.5 g/dL (ref 12.0–15.0)
Immature Granulocytes: 1 %
Lymphocytes Relative: 24 %
Lymphs Abs: 3.2 10*3/uL (ref 0.7–4.0)
MCH: 30.4 pg (ref 26.0–34.0)
MCHC: 35 g/dL (ref 30.0–36.0)
MCV: 86.8 fL (ref 80.0–100.0)
Monocytes Absolute: 0.7 10*3/uL (ref 0.1–1.0)
Monocytes Relative: 5 %
Neutro Abs: 9.1 10*3/uL — ABNORMAL HIGH (ref 1.7–7.7)
Neutrophils Relative %: 69 %
Platelets: 299 10*3/uL (ref 150–400)
RBC: 4.77 MIL/uL (ref 3.87–5.11)
RDW: 12.8 % (ref 11.5–15.5)
WBC: 13.3 10*3/uL — ABNORMAL HIGH (ref 4.0–10.5)
nRBC: 0 % (ref 0.0–0.2)

## 2020-01-23 LAB — BASIC METABOLIC PANEL
Anion gap: 17 — ABNORMAL HIGH (ref 5–15)
BUN: 8 mg/dL (ref 6–20)
CO2: 24 mmol/L (ref 22–32)
Calcium: 9.2 mg/dL (ref 8.9–10.3)
Chloride: 99 mmol/L (ref 98–111)
Creatinine, Ser: 1.07 mg/dL — ABNORMAL HIGH (ref 0.44–1.00)
GFR calc Af Amer: >60 mL/min (ref >60)
GFR calc non Af Amer: >60 mL/min (ref >60)
Glucose, Bld: 119 mg/dL — ABNORMAL HIGH (ref 70–99)
Potassium: 3.4 mmol/L — ABNORMAL LOW (ref 3.5–5.1)
Sodium: 140 mmol/L (ref 135–145)

## 2020-01-23 LAB — ETHANOL: Alcohol, Ethyl (B): <10 mg/dL (ref <10)

## 2020-01-23 LAB — SARS CORONAVIRUS 2 BY RT PCR (HOSPITAL ORDER, PERFORMED IN ~~LOC~~ HOSPITAL LAB): SARS Coronavirus 2: NEGATIVE

## 2020-01-23 MED ORDER — DIPHENHYDRAMINE HCL 50 MG/ML IJ SOLN
25.0000 mg | Freq: Once | INTRAMUSCULAR | Status: AC
  Administered 2020-01-23: 25 mg via INTRAVENOUS
  Filled 2020-01-23: qty 1

## 2020-01-23 MED ORDER — PROCHLORPERAZINE EDISYLATE 10 MG/2ML IJ SOLN
10.0000 mg | Freq: Once | INTRAMUSCULAR | Status: AC
  Administered 2020-01-23: 10 mg via INTRAVENOUS
  Filled 2020-01-23: qty 2

## 2020-01-23 MED ORDER — ENOXAPARIN SODIUM 40 MG/0.4ML ~~LOC~~ SOLN
40.0000 mg | SUBCUTANEOUS | Status: DC
  Administered 2020-01-24: 40 mg via SUBCUTANEOUS
  Filled 2020-01-23: qty 0.4

## 2020-01-23 MED ORDER — LACTATED RINGERS IV BOLUS
1000.0000 mL | Freq: Once | INTRAVENOUS | Status: AC
  Administered 2020-01-23: 1000 mL via INTRAVENOUS

## 2020-01-23 MED ORDER — INSULIN ASPART 100 UNIT/ML ~~LOC~~ SOLN: 0.0000 [IU] | Freq: Three times a day (TID) | SUBCUTANEOUS | Status: DC

## 2020-01-23 MED ORDER — SODIUM CHLORIDE 0.9 % IV SOLN
75.0000 mL/h | INTRAVENOUS | Status: DC
  Administered 2020-01-24: 75 mL/h via INTRAVENOUS

## 2020-01-23 MED ORDER — OXYCODONE HCL 5 MG PO TABS
5.0000 mg | ORAL_TABLET | ORAL | Status: DC | PRN
  Administered 2020-01-23 – 2020-01-24 (×2): 5 mg via ORAL
  Filled 2020-01-23 (×2): qty 1

## 2020-01-23 MED ORDER — SODIUM CHLORIDE 0.9 % IV SOLN: 75.0000 mL/h | INTRAVENOUS | Status: DC

## 2020-01-23 MED ORDER — LORAZEPAM 2 MG/ML IJ SOLN: 1.0000 mg | INTRAMUSCULAR | Status: DC | PRN

## 2020-01-23 MED ORDER — LORAZEPAM 2 MG/ML IJ SOLN
1.0000 mg | Freq: Once | INTRAMUSCULAR | Status: AC
  Administered 2020-01-23: 1 mg via INTRAVENOUS
  Filled 2020-01-23: qty 1

## 2020-01-23 NOTE — ED Triage Notes (Signed)
Pt presents via [facility] with c/o seizure while at home. Pt had witnessed seizure while at store, unknown how long seizure like activity lasted. Pt is currently alert and oriented x4 at this time. Pt has no known hx of seizures.

## 2020-01-23 NOTE — ED Notes (Signed)
Tele- neurologist Lloyd Huger, MD on screen

## 2020-01-23 NOTE — ED Provider Notes (Signed)
[facility] Emergency Department Provider Note   ____________________________________________   First MD Initiated Contact with Patient 01/23/20 1737     (approximate)  I have reviewed the triage vital signs and the nursing notes.   HISTORY  Chief Complaint Seizures    HPI Brittany Nguyen is a 54 y.o. female with past medical history of CHF status post AICD, COPD, DVT, and carcinoid tumor presents to the ED following seizure.  History is limited due to patient's postictal state.  Per EMS, she had witnessed seizure activity while out at a store.  Other customer noticed her to start shaking and then fall to the ground, it is unclear whether she hit her head.  EMS was also unsure how long seizure activity lasted.  Patient noted to be confused during transport but had gradual improvement in mental status.  Patient reportedly has had 1 prior seizure episode a couple of months ago, negative work-up at that time and does not currently take any medications for seizure.  She currently complains of a headache, but states she has been feeling otherwise well with no other complaints.        Past Medical History:  Diagnosis Date  . Asthma 2013  . 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 CHF (congestive heart failure) ([place])   . COPD (chronic obstructive pulmonary disease) ([place])   . Diverticulitis 2010  . DVT (deep venous thrombosis) ([place])   . Endometriosis 1990  . Heart disease 2013  . Hx MRSA infection   . Iron deficiency   . Lump or mass in breast   . Restless leg   . Stomach cancer [facility])     Patient Active Problem List   Diagnosis Date Noted  . Non-ischemic cardiomyopathy ([place]) 01/23/2020  . 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/23/2020  . Iron deficiency anemia 03/27/2018  . Anemia 03/26/2018  . Abdominal pain 02/20/2018  . Unstable angina ([place])   . Encounter for anticoagulation discussion and counseling   .  C. difficile colitis 01/14/2018  . GIB (gastrointestinal bleeding) 01/08/2018  . SOB (shortness of breath) 11/01/2017  . Influenza A 10/29/2017  . Acute respiratory failure ([place]) 09/24/2017  . Sepsis ([place]) 07/03/2017  . COPD (chronic obstructive pulmonary disease) ([place]) 04/26/2017  . Community acquired pneumonia 04/25/2017  . Chronic systolic heart failure ([place]) 02/03/2017  . Hypotension 02/03/2017  . Chest pain 12/09/2016  . RLS (restless legs syndrome) 12/09/2016  . Cough, persistent 11/22/2015  . Hypokalemia 11/22/2015  . Leukocytosis 11/22/2015  . Dyspnea 11/21/2015  . Respiratory distress 09/19/2015  . Breast pain 01/11/2013  . Hx MRSA infection   . Lump or mass in breast   . GOITER, MULTINODULAR 01/20/2009  . HYPOGLYCEMIA, UNSPECIFIED 01/20/2009  . GERD 01/20/2009  . DIVERTICULITIS OF COLON 01/20/2009    Past Surgical History:  Procedure Laterality Date  . ABDOMINAL HYSTERECTOMY     age 37  . BREAST BIOPSY Right 2014   benign  . CARDIAC DEFIBRILLATOR PLACEMENT    . CARDIAC DEFIBRILLATOR PLACEMENT    . CESAREAN SECTION    . CHOLECYSTECTOMY    . COLECTOMY    . COLONOSCOPY WITH PROPOFOL N/A 02/22/2018   Procedure: COLONOSCOPY WITH PROPOFOL;  Surgeon: Toledo, Benay Pike, MD;  Location: [facility] ENDOSCOPY;  Service: Gastroenterology;  Laterality: N/A;  . ESOPHAGOGASTRODUODENOSCOPY (EGD) WITH PROPOFOL N/A 02/22/2018   Procedure: ESOPHAGOGASTRODUODENOSCOPY (EGD) WITH PROPOFOL;  Surgeon: Toledo, Benay Pike, MD;  Location: [facility] ENDOSCOPY;  Service: Gastroenterology;  Laterality: N/A;  . NASAL SINUS SURGERY  2012  . OOPHORECTOMY      Prior to Admission medications   Medication Sig Start Date End Date Taking? Authorizing Provider  albuterol (ACCUNEB) 1.25 MG/3ML nebulizer solution Take 3 mLs by nebulization every 6 (six) hours as needed for wheezing. 11/09/17   [provider]  albuterol (VENTOLIN HFA) 108 (90 Base) MCG/ACT inhaler Inhale 2 puffs into the lungs every 6 (six)  hours as needed for shortness of breath. 08/28/16   [provider]  beclomethasone (QVAR) 80 MCG/ACT inhaler Inhale 1 puff into the lungs 2 (two) times daily. 06/12/17   [provider]  busPIRone (BUSPAR) 15 MG tablet Take 15 mg by mouth 2 (two) times daily. 05/08/18   [provider]  DULoxetine (CYMBALTA) 30 MG capsule Take 30 mg by mouth daily. 10/27/19   [provider]  ENTRESTO 24-26 MG Take 0.5 tablets by mouth 2 (two) times daily. 01/08/20   [provider]  famotidine (PEPCID) 20 MG tablet Take 1 tablet (20 mg total) by mouth 2 (two) times daily. 03/15/19   Carrie Mew, MD  FARXIGA 5 MG TABS tablet Take 5 mg by mouth daily. 11/11/19   [provider]  fentaNYL (DURAGESIC) 50 MCG/HR Place 1 patch onto the skin every 3 (three) days. Patient not taking: Reported on 01/13/2020 10/08/18   Epifanio Lesches, MD  furosemide (LASIX) 40 MG tablet Take 80 mg by mouth 2 (two) times daily.  07/09/18   [provider]  hydrOXYzine (ATARAX/VISTARIL) 25 MG tablet Take 25 mg by mouth every 6 (six) hours as needed for itching. 04/13/19   [provider]  levothyroxine (SYNTHROID, LEVOTHROID) 150 MCG tablet Take 150 mcg by mouth daily before breakfast.    [provider]  LORazepam (ATIVAN) 1 MG tablet Take 1 mg by mouth 4 (four) times daily as needed. 12/20/19   [provider]  losartan (COZAAR) 25 MG tablet Take 25 mg by mouth daily.     [provider]  metoprolol succinate (TOPROL-XL) 50 MG 24 hr tablet Take 50 mg by mouth 2 (two) times daily. 01/25/19   [provider]  mirtazapine (REMERON) 15 MG tablet Take 15 mg by mouth at bedtime. 04/26/19 05/26/24  [provider]  nitroGLYCERIN (NITROSTAT) 0.4 MG SL tablet Place 1 tablet (0.4 mg total) under the tongue every 5 (five) minutes as needed for chest pain. 01/24/17   Henreitta Leber, MD  omeprazole (PRILOSEC) 40 MG capsule Take 40 mg by mouth 2  (two) times daily. 10/19/18   [provider]  oxyCODONE (OXY IR/ROXICODONE) 5 MG immediate release tablet Take 5-10 mg by mouth every 4 (four) hours as needed for pain. 04/11/19   [provider]  Pancrelipase, Lip-Prot-Amyl, (CREON) 24000-76000 units CPEP Take 3-4 capsules by mouth 3 (three) times daily with meals. 04/01/19   [provider]  pantoprazole (PROTONIX) 40 MG tablet Take 40 mg by mouth 2 (two) times daily. 12/20/19   [provider]  potassium chloride SA (KLOR-CON M20) 20 MEQ tablet Take 1 tablet (20 mEq total) by mouth 2 (two) times daily for 7 days. 07/09/19 01/13/20  Carrie Mew, MD  promethazine (PHENERGAN) 25 MG tablet Take 1 tablet (25 mg total) by mouth every 6 (six) hours as needed. 07/09/19   Carrie Mew, MD  rOPINIRole (REQUIP) 4 MG tablet Take 4 mg by mouth at bedtime. 01/30/19   [provider]  spironolactone (ALDACTONE) 50 MG tablet  Take 50 mg by mouth daily. 11/26/18   [provider]  sucralfate (CARAFATE) 1 g tablet Take 1 tablet (1 g total) by mouth 4 (four) times daily. 03/15/19   Carrie Mew, MD  vitamin B-12 (CYANOCOBALAMIN) 1000 MCG tablet Take 1,000 mcg by mouth daily.    [provider]    Allergies Contrast media [iodinated diagnostic agents], Lidocaine, Metrizamide, Penicillins, Isosorbide nitrate, Latex, Ondansetron, Zofran [ondansetron hcl], Hydromorphone, Povidone-iodine, and Pulmicort [budesonide]  Family History  Problem Relation Age of Onset  . Cancer Mother 30       ovarian  . CAD Mother   . Cancer Father 62       brain  . CAD Father   . Cancer Daughter 18       skin  . Cancer Maternal Aunt 23       breast  . Leukemia Paternal Grandfather     Social History Social History   Tobacco Use  . Smoking status: Never Smoker  . Smokeless tobacco: Never Used  Substance Use Topics  . Alcohol use: No  . Drug use: No    Review of Systems  Constitutional: No  fever/chills Eyes: No visual changes. ENT: No sore throat. Cardiovascular: Denies chest pain. Respiratory: Denies shortness of breath. Gastrointestinal: No abdominal pain.  No nausea, no vomiting.  No diarrhea.  No constipation. Genitourinary: Negative for dysuria. Musculoskeletal: Negative for back pain. Skin: Negative for rash. Neurological: Positive for headaches, negative for focal weakness or numbness.  Positive for seizure.  ____________________________________________   PHYSICAL EXAM:  VITAL SIGNS: ED Triage Vitals [01/23/20 1738]  Enc Vitals Group     BP      Pulse      Resp      Temp 98.9 F (37.2 C)     Temp Source Oral     SpO2      Weight      Height      Head Circumference      Peak Flow      Pain Score      Pain Loc      Pain Edu?      Excl. in [place]?     Constitutional: Alert and oriented. Eyes: Conjunctivae are normal.  Pupils equal round and reactive to light bilaterally. Head: Atraumatic. Nose: No congestion/rhinnorhea. Mouth/Throat: Mucous membranes are moist. Neck: Normal ROM, no midline cervical spine tenderness. Cardiovascular: Normal rate, regular rhythm. Grossly normal heart sounds. Respiratory: Normal respiratory effort.  No retractions. Lungs CTAB. Gastrointestinal: Soft and nontender. No distention. Genitourinary: deferred Musculoskeletal: No lower extremity tenderness nor edema. Neurologic:  Normal speech and language. No gross focal neurologic deficits are appreciated. Skin:  Skin is warm, dry and intact. No rash noted. Psychiatric: Mood and affect are normal. Speech and behavior are normal.  ____________________________________________   LABS (all labs ordered are listed, but only abnormal results are displayed)  Labs Reviewed  CBC WITH DIFFERENTIAL/PLATELET - Abnormal; Notable for the following components:      Result Value   WBC 13.3 (*)    Neutro Abs 9.1 (*)    Abs Immature Granulocytes 0.10 (*)    All other components within  normal limits  BASIC METABOLIC PANEL - Abnormal; Notable for the following components:   Potassium 3.4 (*)    Glucose, Bld 119 (*)    Creatinine, Ser 1.07 (*)    Anion gap 17 (*)    All other components within normal limits  SARS CORONAVIRUS 2 BY RT PCR [facility]  ORDER, PERFORMED IN [place] [facility])  ETHANOL   ____________________________________________  EKG  ED ECG REPORT I, Blake Divine, the attending physician, personally viewed and interpreted this ECG.   Date: 01/23/2020  EKG Time: 17:40  Rate: 120  Rhythm: sinus tachycardia  Axis: Normal  Intervals:none  ST&T Change: None   PROCEDURES  Procedure(s) performed (including Critical Care):  Procedures   ____________________________________________   INITIAL IMPRESSION / ASSESSMENT AND PLAN / ED COURSE       54 year old female with possible history of carcinoid tumor, CHF status post AICD, DVT, and COPD presents to the ED following witnessed seizure activity at the store, noted to be postictal by EMS.  Patient is slightly confused but alert and oriented x4 upon arrival to the ED, no focal neurologic deficits noted.  She complains only of a headache, but otherwise has been feeling well recently.  She denies any alcohol or drug abuse, but does have fentanyl patch in place for her chronic abdominal pain.  Given potential head trauma, we will check CT head, able to clear C-spine clinically.  Also screen labs and give dose of IV Ativan to guard against recurrent seizure.  Syncope remains on the differential, especially given her cardiac history, but seizure seems more likely.  EKG shows sinus tachycardia, otherwise unremarkable, we will interrogate her AICD to ensure no events.  Plan to discuss with neurology for medication recommendations.  AICD interrogation is unremarkable, she did not have nonsustained V. tach back in March but no events today.  Lab work is unremarkable and CT head is negative for acute process.   Patient evaluated by teleneurologist, who recommends admission for MRI and EEG.  They also recommend holding off on any anticonvulsant medication for now, but advise loading with Keppra if she were to have another episode.  Case was discussed with hospitalist for admission.      ____________________________________________   FINAL CLINICAL IMPRESSION(S) / ED DIAGNOSES  Final diagnoses:  Seizure ([place])  Non-ischemic cardiomyopathy [facility])     ED Discharge Orders    None       Note:  This document was prepared using Dragon voice recognition software and may include unintentional dictation errors.   Blake Divine, MD 01/23/20 [phone]

## 2020-01-23 NOTE — H&P (Signed)
History and Physical    Brittany Nguyen [id number] DOB: 1966/06/02 DOA: 01/23/2020  PCP: [name], MD   Patient coming from: home I have personally briefly reviewed patient's old medical records in [place]  Chief Complaint: Seizure  HPI: Brittany Nguyen is a 54 y.o. female with medical history significant for sCHF secondary to nonischemic cardiomyopathy status post AICD, COPD, carcinoid tumor, diabetes, with history of seizure a 2 months ago with negative work-up, who was brought into the emergency room by EMS after having a witnessed seizure at a store.  She was seen with jerking movements.  Says she did not hit her head.  Had no tongue biting, no urinary incontinence.  Was postictal with EMS.    ED Course: Patient was post ictal on arrival but readily became alert and oriented.  She has no recollection of the events.  Vitals remarkable only for mild tachycardia of 118.  Blood work significant for WBC of 15,000.  EtOH level less than 10.  A telemetry neurology consult was requested for safety for discharge.  Recommendation to admit patient to observation and start Keppra load if recurrent seizure.  EEG.  Hospitalist consulted for admission.  Review of Systems: As per HPI otherwise 10 point review of systems negative.    Past Medical History:  Diagnosis Date  . Asthma 2013  . 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 CHF (congestive heart failure) ([place])   . COPD (chronic obstructive pulmonary disease) ([place])   . Diverticulitis 2010  . DVT (deep venous thrombosis) ([place])   . Endometriosis 1990  . Heart disease 2013  . Hx MRSA infection   . Iron deficiency   . Lump or mass in breast   . Restless leg   . Stomach cancer [facility])     Past Surgical History:  Procedure Laterality Date  . ABDOMINAL HYSTERECTOMY     age 64  . BREAST BIOPSY Right 2014   benign  . CARDIAC DEFIBRILLATOR PLACEMENT    . CARDIAC DEFIBRILLATOR PLACEMENT    . CESAREAN SECTION     . CHOLECYSTECTOMY    . COLECTOMY    . COLONOSCOPY WITH PROPOFOL N/A 02/22/2018   Procedure: COLONOSCOPY WITH PROPOFOL;  Surgeon: Toledo, Benay Pike, MD;  Location: [facility] ENDOSCOPY;  Service: Gastroenterology;  Laterality: N/A;  . ESOPHAGOGASTRODUODENOSCOPY (EGD) WITH PROPOFOL N/A 02/22/2018   Procedure: ESOPHAGOGASTRODUODENOSCOPY (EGD) WITH PROPOFOL;  Surgeon: Toledo, Benay Pike, MD;  Location: [facility] ENDOSCOPY;  Service: Gastroenterology;  Laterality: N/A;  . NASAL SINUS SURGERY  2012  . OOPHORECTOMY       reports that she has never smoked. She has never used smokeless tobacco. She reports that she does not drink alcohol or use drugs.  Allergies  Allergen Reactions  . Contrast Media [Iodinated Diagnostic Agents] Shortness Of Breath    Per CT report in 2014 pt had break through contrast reaction with hives and SOB following 13 hour prep. MSY   . Lidocaine Hives  . Metrizamide Shortness Of Breath  . Penicillins Hives and Other (See Comments)    Other reaction(s): Other (See Comments) Has patient had a PCN reaction causing immediate rash, facial/tongue/throat swelling, SOB or lightheadedness with hypotension: Yes Has patient had a PCN reaction causing severe rash involving mucus membranes or skin necrosis: No Has patient had a PCN reaction that required hospitalization No Has patient had a PCN reaction occurring within the last 10 years: No If all of the above answers are "NO", then may  proceed with Cephalosporin use. Has patient had a PCN reaction causing immediate rash, facial/tongue/throat swelling, SOB or lightheadedness with hypotension: Yes Has patient had a PCN reaction causing severe rash involving mucus membranes or skin necrosis: No Has patient had a PCN reaction that required hospitalization No Has patient had a PCN reaction occurring within the last 10 years: No If all of the above answers are "NO", then may proceed with Cephalosporin use.  ediate rash, facial/tongue/throat  swelling, SOB or lightheadedness with hypotension, tachy  . Isosorbide Nitrate     Other reaction(s): Headache  . Latex Hives  . Ondansetron Other (See Comments)    Severe headache  . Zofran [Ondansetron Hcl] Other (See Comments)    Causes migraines  . Hydromorphone Rash  . Povidone-Iodine Rash    Blistering rash  . Pulmicort [Budesonide] Itching    Family History  Problem Relation Age of Onset  . Cancer Mother 30       ovarian  . CAD Mother   . Cancer Father 86       brain  . CAD Father   . Cancer Daughter 18       skin  . Cancer Maternal Aunt 70       breast  . Leukemia Paternal Grandfather      Prior to Admission medications   Medication Sig Start Date End Date Taking? Authorizing Provider  oxyCODONE-acetaminophen (PERCOCET/ROXICET) 5-325 MG tablet Take by mouth. 01/17/20  Yes [provider]  albuterol (ACCUNEB) 1.25 MG/3ML nebulizer solution Take 3 mLs by nebulization every 6 (six) hours as needed for wheezing. 11/09/17   [provider]  albuterol (VENTOLIN HFA) 108 (90 Base) MCG/ACT inhaler Inhale 2 puffs into the lungs every 6 (six) hours as needed for shortness of breath. 08/28/16   [provider]  beclomethasone (QVAR) 80 MCG/ACT inhaler Inhale 1 puff into the lungs 2 (two) times daily. 06/12/17   [provider]  busPIRone (BUSPAR) 15 MG tablet Take 15 mg by mouth 2 (two) times daily. 05/08/18   [provider]  DULoxetine (CYMBALTA) 30 MG capsule Take 30 mg by mouth daily. 10/27/19   [provider]  ENTRESTO 24-26 MG Take 0.5 tablets by mouth 2 (two) times daily. 01/08/20   [provider]  famotidine (PEPCID) 20 MG tablet Take 1 tablet (20 mg total) by mouth 2 (two) times daily. 03/15/19   Carrie Mew, MD  FARXIGA 5 MG TABS tablet Take 5 mg by mouth daily. 11/11/19   [provider]  fentaNYL (DURAGESIC) 50 MCG/HR Place 1 patch onto the skin every 3 (three) days. Patient not taking: Reported on  01/13/2020 10/08/18   Epifanio Lesches, MD  furosemide (LASIX) 40 MG tablet Take 80 mg by mouth 2 (two) times daily.  07/09/18   [provider]  hydrOXYzine (ATARAX/VISTARIL) 25 MG tablet Take 25 mg by mouth every 6 (six) hours as needed for itching. 04/13/19   [provider]  levothyroxine (SYNTHROID, LEVOTHROID) 150 MCG tablet Take 150 mcg by mouth daily before breakfast.    [provider]  LORazepam (ATIVAN) 1 MG tablet Take 1 mg by mouth 4 (four) times daily as needed. 12/20/19   [provider]  losartan (COZAAR) 25 MG tablet Take 25 mg by mouth daily.     [provider]  metoprolol succinate (TOPROL-XL) 50 MG 24 hr tablet Take 50 mg by mouth 2 (two) times daily. 01/25/19   [provider]  mirtazapine (REMERON) 15 MG tablet  Take 15 mg by mouth at bedtime. 04/26/19 05/26/24  [provider]  nitroGLYCERIN (NITROSTAT) 0.4 MG SL tablet Place 1 tablet (0.4 mg total) under the tongue every 5 (five) minutes as needed for chest pain. 01/24/17   Henreitta Leber, MD  omeprazole (PRILOSEC) 40 MG capsule Take 40 mg by mouth 2 (two) times daily. 10/19/18   [provider]  oxyCODONE (OXY IR/ROXICODONE) 5 MG immediate release tablet Take 5-10 mg by mouth every 4 (four) hours as needed for pain. 04/11/19   [provider]  Pancrelipase, Lip-Prot-Amyl, (CREON) 24000-76000 units CPEP Take 3-4 capsules by mouth 3 (three) times daily with meals. 04/01/19   [provider]  pantoprazole (PROTONIX) 40 MG tablet Take 40 mg by mouth 2 (two) times daily. 12/20/19   [provider]  potassium chloride SA (KLOR-CON M20) 20 MEQ tablet Take 1 tablet (20 mEq total) by mouth 2 (two) times daily for 7 days. 07/09/19 01/13/20  Carrie Mew, MD  promethazine (PHENERGAN) 25 MG tablet Take 1 tablet (25 mg total) by mouth every 6 (six) hours as needed. 07/09/19   Carrie Mew, MD  rOPINIRole (REQUIP) 4 MG tablet Take 4 mg by mouth  at bedtime. 01/30/19   [provider]  spironolactone (ALDACTONE) 50 MG tablet Take 50 mg by mouth daily. 11/26/18   [provider]  sucralfate (CARAFATE) 1 g tablet Take 1 tablet (1 g total) by mouth 4 (four) times daily. 03/15/19   Carrie Mew, MD  vitamin B-12 (CYANOCOBALAMIN) 1000 MCG tablet Take 1,000 mcg by mouth daily.    [provider]    Physical Exam: Vitals:   01/23/20 1738 01/23/20 1741 01/23/20 1742  BP:  125/70   Pulse:  (!) 118   Resp:  19   Temp: 98.9 F (37.2 C)    TempSrc: Oral    SpO2:  97%   Weight:   91.6 kg  Height:   5\' 5"  (1.651 m)     Vitals:   01/23/20 1738 01/23/20 1741 01/23/20 1742  BP:  125/70   Pulse:  (!) 118   Resp:  19   Temp: 98.9 F (37.2 C)    TempSrc: Oral    SpO2:  97%   Weight:   91.6 kg  Height:   5\' 5"  (1.651 m)    Constitutional: Alert and awake, oriented x3, not in any acute distress. Eyes: PERLA, EOMI, irises appear normal, anicteric sclera,  ENMT: external ears and nose appear normal, normal hearing             Lips appears normal, oropharynx mucosa, tongue, posterior pharynx appear normal  Neck: neck appears normal, no masses, normal ROM, no thyromegaly, no JVD  CVS: S1-S2 clear, no murmur rubs or gallops,  , no carotid bruits, pedal pulses palpable, No LE edema Respiratory:  clear to auscultation bilaterally, no wheezing, rales or rhonchi. Respiratory effort normal. No accessory muscle use.  Abdomen: soft nontender, nondistended, normal bowel sounds, no hepatosplenomegaly, no hernias Musculoskeletal: : no cyanosis, clubbing , no contractures or atrophy Neuro: Cranial nerves II-XII intact, sensation, reflexes normal, strength Psych: judgement and insight appear normal, stable mood and affect,  Skin: no rashes or lesions or ulcers, no induration or nodules   Labs on Admission: I have personally reviewed following labs and imaging studies  CBC: Recent Labs  Lab 01/23/20 1746  WBC 13.3*    NEUTROABS 9.1*  HGB 14.5  HCT 41.4  MCV 86.8  PLT 299  Basic Metabolic Panel: Recent Labs  Lab 01/23/20 1746  NA 140  K 3.4*  CL 99  CO2 24  GLUCOSE 119*  BUN 8  CREATININE 1.07*  CALCIUM 9.2   GFR: Estimated Creatinine Clearance: 69.5 mL/min (A) (by C-G formula based on SCr of 1.07 mg/dL (H)). Liver Function Tests: No results for input(s): AST, ALT, ALKPHOS, BILITOT, PROT, ALBUMIN in the last 168 hours. No results for input(s): LIPASE, AMYLASE in the last 168 hours. No results for input(s): AMMONIA in the last 168 hours. Coagulation Profile: No results for input(s): INR, PROTIME in the last 168 hours. Cardiac Enzymes: No results for input(s): CKTOTAL, CKMB, CKMBINDEX, TROPONINI in the last 168 hours. BNP (last 3 results) No results for input(s): PROBNP in the last 8760 hours. HbA1C: No results for input(s): HGBA1C in the last 72 hours. CBG: No results for input(s): GLUCAP in the last 168 hours. Lipid Profile: No results for input(s): CHOL, HDL, LDLCALC, TRIG, CHOLHDL, LDLDIRECT in the last 72 hours. Thyroid Function Tests: No results for input(s): TSH, T4TOTAL, FREET4, T3FREE, THYROIDAB in the last 72 hours. Anemia Panel: No results for input(s): VITAMINB12, FOLATE, FERRITIN, TIBC, IRON, RETICCTPCT in the last 72 hours. Urine analysis:    Component Value Date/Time   COLORURINE YELLOW (A) 01/02/2020 1555   APPEARANCEUR CLEAR (A) 01/02/2020 1555   APPEARANCEUR Clear 09/02/2014 2312   LABSPEC 1.013 01/02/2020 1555   LABSPEC 1.013 09/02/2014 2312   PHURINE 5.0 01/02/2020 1555   GLUCOSEU NEGATIVE 01/02/2020 1555   GLUCOSEU Negative 09/02/2014 2312   HGBUR NEGATIVE 01/02/2020 1555   BILIRUBINUR NEGATIVE 01/02/2020 1555   BILIRUBINUR Negative 09/02/2014 2312   KETONESUR NEGATIVE 01/02/2020 1555   PROTEINUR NEGATIVE 01/02/2020 1555   NITRITE NEGATIVE 01/02/2020 [place] 01/02/2020 1555   LEUKOCYTESUR Negative 09/02/2014 2312     Radiological Exams on Admission: CT Head Wo Contrast  Result Date: 01/23/2020 CLINICAL DATA:  New onset seizures EXAM: CT HEAD WITHOUT CONTRAST TECHNIQUE: Contiguous axial images were obtained from the base of the skull through the vertex without intravenous contrast. COMPARISON:  None. FINDINGS: Brain: There is no mass, hemorrhage or extra-axial collection. The size and configuration of the ventricles and extra-axial CSF spaces are normal. The brain parenchyma is normal, without acute or chronic infarction. Vascular: No abnormal hyperdensity of the major intracranial arteries or dural venous sinuses. No intracranial atherosclerosis. Skull: The visualized skull base, calvarium and extracranial soft tissues are normal. Sinuses/Orbits: No fluid levels or advanced mucosal thickening of the visualized paranasal sinuses. No mastoid or middle ear effusion. The orbits are normal. IMPRESSION: Normal head CT. Electronically Signed   By: Ulyses Jarred M.D.   On: 01/23/2020 18:24    EKG: Independently reviewed.   Assessment/Plan Principal Problem:   Seizure [facility]) -Patient with witnessed seizure event at a store.  No evidence of injury -History of prior seizures some months ago with negative EEG and negative work-up by outpatient neurology -Admitted to observation -Repeat EEG per teleneurology recommendation -Keppra load if seizure recurs per teleneurology recommendation -Seizure and fall precautions.  Neurochecks    Chronic systolic heart failure (HCC)   Non-ischemic cardiomyopathy (HCC) S/P implantation of automatic cardioverter/defibrillator (AICD) -Not acutely exacerbated -Continue Lasix, metoprolol pending med rec.awaiting med rec to determine whether patient is on Entresto or losartan       COPD (chronic obstructive pulmonary disease) ([place]) -Not acutely exacerbated -DuoNebs as needed    Post-surgical hypothyroidism -Continue levothyroxine 150 mg daily pending med rec  Benign  neuroendocrine tumor of stomach -History of carcinoid tumor followed by oncology -No acute concerns    Chronic use of opiate for therapeutic purpose -On arrival patient was noted to have a fentanyl patch on and she is also on oxycodone -Oxycodone as needed    Type 2 diabetes mellitus without complication (HCC) -Regular insulin sliding scale coverage -Follow A1c    DVT prophylaxis: Lovenox  Code Status: full code  Family Communication:  none  Disposition Plan: Back to previous home environment Consults called: neurology  Status:obs    Athena Masse MD [facility]     01/23/2020, 8:10 PM

## 2020-01-23 NOTE — Consult Note (Signed)
[facility] TeleNeurology Consult Services  Stat Consult  Date of Service:   01/23/2020 18:30:36  Impression:     .  R56.9 - Seizures  Comments/Sign-Out: 54 yr old woman with second seizure in 2 months. Exam non focal , CT head unremarkable. Diff Dx: epileptic versus from missed meds, non epileptic seizure. Rec: EEG, MRI Brain, seizure protocol, seizure precautions, consider keppra loading dose with another event. D/w pt, husband, ER, all of the above.  CT HEAD: Showed No Acute Hemorrhage or Acute Core Infarct  Metrics: [facility] Notification Time: 01/23/2020 18:28:42 Stamp Time: 01/23/2020 18:30:36 Callback Response Time: 01/23/2020 18:32:35  Our recommendations are outlined below.  Recommendations:     .  - Seizure precautions     .  - EEG     .  - MRI Brain, w, wout gad     .  - consider keppra loadign dose if has another seizure in ER.  Imaging Studies:     .  MRI Head with and Without Contrast  Therapies:     .  Physical Therapy, Occupational Therapy, Speech Therapy Assessment When Applicable  Other WorkUp:     .  Infectious/metabolic workup per primary team  Disposition: Neurology Follow Up Recommended  Sign Out:     .  Discussed with Emergency Department Provider  ----------------------------------------------------------------------------------------------------  Chief Complaint: seizure  History of Present Illness: Patient is a 54 year old Female.  54 yr old woman with hx of CAD, CHF- post pacemaker, RLS, HTN, lipids, anxiety- who comes via EMS after having a seizure- she felt dizziness, and lightheadedness prodrome, and followed by convulsion, ? unclear time. She did not have tongue bite, or urine loss. She is tired now, but otherwise non focal. Per husband at bedside, she had similar event seizure last month, and work up was negative.   Past Medical History:     . Hypertension     . Hyperlipidemia     . Coronary Artery  Disease      Examination: BP(pending), Pulse(pending), Blood Glucose(119) 1A: Level of Consciousness - Alert; keenly responsive + 0 1B: Ask Month and Age - Both Questions Right + 0 1C: Blink Eyes & Squeeze Hands - Performs Both Tasks + 0 2: Test Horizontal Extraocular Movements - Normal + 0 3: Test Visual Fields - No Visual Loss + 0 4: Test Facial Palsy (Use Grimace if Obtunded) - Normal symmetry + 0 5A: Test Left Arm Motor Drift - No Drift for 10 Seconds + 0 5B: Test Right Arm Motor Drift - No Drift for 10 Seconds + 0 6A: Test Left Leg Motor Drift - No Drift for 5 Seconds + 0 6B: Test Right Leg Motor Drift - No Drift for 5 Seconds + 0 7: Test Limb Ataxia (FNF/Heel-Shin) - No Ataxia + 0 8: Test Sensation - Normal; No sensory loss + 0 9: Test Language/Aphasia - Normal; No aphasia + 0 10: Test Dysarthria - Normal + 0 11: Test Extinction/Inattention - No abnormality + 0  NIHSS Score: 0   Patient/Family was informed the Neurology Consult would occur via TeleHealth consult by way of interactive audio and video telecommunications and consented to receiving care in this manner.  Patient is being evaluated for possible acute neurologic impairment and high probability of imminent or life-threatening deterioration. I spent total of 25 minutes providing care to this patient, including time for face to face visit via telemedicine, review of medical records, imaging studies and discussion of findings with providers, the patient  and/or family.   Dr Lloyd Huger   [facility] [phone]  Case [id number]

## 2020-01-23 NOTE — ED Notes (Signed)
Pt to CT

## 2020-01-23 NOTE — ED Notes (Signed)
[employer] rep called this RN with report. No arrhythmia noted today on device. The fluid monitoring feature showed that pt's fluid index level was elevated. Other than that, device appears to be functioning properly. MD Jessup aware.

## 2020-01-23 NOTE — ED Notes (Signed)
Teleneuro cart at bedside

## 2020-01-24 DIAGNOSIS — R569 Unspecified convulsions: Secondary | ICD-10-CM | POA: Diagnosis not present

## 2020-01-24 NOTE — ED Notes (Signed)
Patient states she is needing to leave.  Patient states her brother is actively dying and they are asking family to come visit.  Patient states she is feeling much better.  This RN notified attending MD who stated patient would have to leave AMA and that it was important to emphasize with patient no driving and to follow up with neurology.  This RN notified patient of these things and patient verbalized understanding.  Patient's husband is coming to pick patient up.

## 2020-01-24 NOTE — Discharge Summary (Signed)
Physician Discharge Summary  Brittany Nguyen [id number] DOB: [birth year] DOA: 01/23/2020  PCP: Brittany Pink, MD  Admit date: 01/23/2020 Discharge date: 01/24/2020  Admitted From: home Disposition:  Home  <<<<<<< PATIENT SIGNED Brittany Nguyen >>>>>>> Received notification from patient's nurse early this AM that patient stated feeling better and wanted to go home.  Advised I needed to review patient' chart and see her, evaluate her prior to deciding on safety of discharge.  Patient did not wait to be seen.  Left against medical advice.    She was given strict instructions not to drive.  Close follow up with Brittany Nguyen neurology was requested.   Discharge Condition: unknown  CODE STATUS: full  Diet recommendation: Heart Healthy / Carb Modified   Discharge Diagnoses: Principal Problem:   Seizure ([place]) Active Problems:   Chronic systolic heart failure (HCC)   COPD (chronic obstructive pulmonary disease) (HCC)   Non-ischemic cardiomyopathy (HCC)   S/P implantation of automatic cardioverter/defibrillator (AICD)   Post-surgical hypothyroidism   Benign neuroendocrine tumor of stomach   Chronic use of opiate for therapeutic purpose   Type 2 diabetes mellitus without complication ([place])    Summary of HPI and Hospital Course:   Brittany Nguyen is a 54 y.o. female with medical history significant for sCHF secondary to nonischemic cardiomyopathy status post AICD, COPD, carcinoid tumor, diabetes, with history of seizure a 2 months ago with negative work-up, who was brought into the emergency room by EMS after having a witnessed seizure at a store.  She was seen with jerking movements.  Says she did not hit her head.  Had no tongue biting, no urinary incontinence.  Was postictal with EMS.    ED Course: Patient was post ictal on arrival but readily became alert and oriented.  She has no recollection of the events.  Vitals remarkable only for mild tachycardia of 118.  Blood work  significant for WBC of 15,000.  EtOH level less than 10.  A telemetry neurology consult was requested for safety for discharge.  Recommendation to admit patient to observation and start Keppra load if recurrent seizure.  EEG.  Hospitalist consulted for admission.  <<<<<<< PATIENT SIGNED Brittany Nguyen >>>>>>>   Discharge Instructions    Allergies as of 01/24/2020      Reactions   Contrast Media [iodinated Diagnostic Agents] Shortness Of Breath   Per CT report in 2014 pt had break through contrast reaction with hives and SOB following 13 hour prep. MSY    Lidocaine Hives   Metrizamide Shortness Of Breath   Penicillins Hives, Other (See Comments)   Other reaction(s): Other (See Comments) Has patient had a PCN reaction causing immediate rash, facial/tongue/throat swelling, SOB or lightheadedness with hypotension: Yes Has patient had a PCN reaction causing severe rash involving mucus membranes or skin necrosis: No Has patient had a PCN reaction that required hospitalization No Has patient had a PCN reaction occurring within the last 10 years: No If all of the above answers are "NO", then may proceed with Cephalosporin use. Has patient had a PCN reaction causing immediate rash, facial/tongue/throat swelling, SOB or lightheadedness with hypotension: Yes Has patient had a PCN reaction causing severe rash involving mucus membranes or skin necrosis: No Has patient had a PCN reaction that required hospitalization No Has patient had a PCN reaction occurring within the last 10 years: No If all of the above answers are "NO", then may proceed with Cephalosporin use. ediate rash, facial/tongue/throat swelling, SOB or lightheadedness  with hypotension, tachy   Isosorbide Nitrate    Other reaction(s): Headache   Latex Hives   Ondansetron Other (See Comments)   Severe headache   Zofran [ondansetron Hcl] Other (See Comments)   Causes migraines   Hydromorphone Rash   Povidone-iodine Rash    Blistering rash   Pulmicort [budesonide] Itching      Medication List    ASK your doctor about these medications   albuterol 108 (90 Base) MCG/ACT inhaler Commonly known as: VENTOLIN HFA Inhale 2 puffs into the lungs every 6 (six) hours as needed for shortness of breath.   albuterol 1.25 MG/3ML nebulizer solution Commonly known as: ACCUNEB Take 3 mLs by nebulization every 6 (six) hours as needed for wheezing.   beclomethasone 80 MCG/ACT inhaler Commonly known as: QVAR Inhale 1 puff into the lungs 2 (two) times daily.   busPIRone 15 MG tablet Commonly known as: BUSPAR Take 15 mg by mouth 2 (two) times daily as needed.   Creon 24000-76000 units Cpep Generic drug: Pancrelipase (Lip-Prot-Amyl) Take 3-4 capsules by mouth 3 (three) times daily with meals.   DULoxetine 30 MG capsule Commonly known as: CYMBALTA Take 30 mg by mouth daily.   Entresto 24-26 MG Generic drug: sacubitril-valsartan Take 0.5 tablets by mouth 2 (two) times daily.   famotidine 20 MG tablet Commonly known as: PEPCID Take 1 tablet (20 mg total) by mouth 2 (two) times daily.   Farxiga 5 MG Tabs tablet Generic drug: dapagliflozin propanediol Take 5 mg by mouth daily.   fentaNYL 50 MCG/HR Commonly known as: [place] 1 patch onto the skin every 3 (three) days.   furosemide 40 MG tablet Commonly known as: LASIX Take 80 mg by mouth 2 (two) times daily.   hydrOXYzine 25 MG tablet Commonly known as: ATARAX/VISTARIL Take 25 mg by mouth every 6 (six) hours as needed for itching.   levothyroxine 175 MCG tablet Commonly known as: SYNTHROID Take 175 mcg by mouth daily before breakfast.   LORazepam 1 MG tablet Commonly known as: ATIVAN Take 1 mg by mouth 4 (four) times daily as needed.   losartan 25 MG tablet Commonly known as: COZAAR Take 25 mg by mouth daily.   metoprolol succinate 50 MG 24 hr tablet Commonly known as: TOPROL-XL Take 50 mg by mouth 2 (two) times daily.   mirtazapine 15  MG tablet Commonly known as: REMERON Take 15 mg by mouth at bedtime.   morphine 15 MG tablet Commonly known as: MSIR Take 15 mg by mouth as needed for severe pain.   nitroGLYCERIN 0.4 MG SL tablet Commonly known as: NITROSTAT Place 1 tablet (0.4 mg total) under the tongue every 5 (five) minutes as needed for chest pain.   omeprazole 40 MG capsule Commonly known as: PRILOSEC Take 40 mg by mouth 2 (two) times daily.   ONE-A-DAY WOMENS PO Take 1 tablet by mouth daily.   oxyCODONE 5 MG immediate release tablet Commonly known as: Oxy IR/ROXICODONE Take 5-10 mg by mouth every 4 (four) hours as needed for pain.   oxyCODONE-acetaminophen 5-325 MG tablet Commonly known as: PERCOCET/ROXICET Take 1 tablet by mouth every 4 (four) hours as needed.   pantoprazole 40 MG tablet Commonly known as: PROTONIX Take 40 mg by mouth 2 (two) times daily.   potassium chloride SA 20 MEQ tablet Commonly known as: [place]-Con M20 Take 1 tablet (20 mEq total) by mouth 2 (two) times daily for 7 days.   promethazine 25 MG tablet Commonly known as: PHENERGAN Take  1 tablet (25 mg total) by mouth every 6 (six) hours as needed.   rOPINIRole 4 MG tablet Commonly known as: REQUIP Take 4 mg by mouth at bedtime.   spironolactone 50 MG tablet Commonly known as: ALDACTONE Take 50 mg by mouth daily.   sucralfate 1 g tablet Commonly known as: Carafate Take 1 tablet (1 g total) by mouth 4 (four) times daily.   vitamin B-12 1000 MCG tablet Commonly known as: CYANOCOBALAMIN Take 1,000 mcg by mouth daily.      Follow-up Information    Vladimir Crofts, MD. Schedule an appointment as soon as possible for a visit in 3 day(s).   Specialty: Neurology Why: Patient needs follow up for seizure ASAP.  She left hospital AMA. Contact information: [facility] [place] [address] [phone]        Brittany Pink, MD. Schedule an appointment as soon as possible for a  visit in 1 week(s).   Specialty: Family Medicine Contact information: [address] [place] [address] [phone]        Efrain Sella, MD .   Specialty: Gastroenterology Contact information: [address] [place] [address] [phone]          Allergies  Allergen Reactions  . Contrast Media [Iodinated Diagnostic Agents] Shortness Of Breath    Per CT report in 2014 pt had break through contrast reaction with hives and SOB following 13 hour prep. MSY   . Lidocaine Hives  . Metrizamide Shortness Of Breath  . Penicillins Hives and Other (See Comments)    Other reaction(s): Other (See Comments) Has patient had a PCN reaction causing immediate rash, facial/tongue/throat swelling, SOB or lightheadedness with hypotension: Yes Has patient had a PCN reaction causing severe rash involving mucus membranes or skin necrosis: No Has patient had a PCN reaction that required hospitalization No Has patient had a PCN reaction occurring within the last 10 years: No If all of the above answers are "NO", then may proceed with Cephalosporin use. Has patient had a PCN reaction causing immediate rash, facial/tongue/throat swelling, SOB or lightheadedness with hypotension: Yes Has patient had a PCN reaction causing severe rash involving mucus membranes or skin necrosis: No Has patient had a PCN reaction that required hospitalization No Has patient had a PCN reaction occurring within the last 10 years: No If all of the above answers are "NO", then may proceed with Cephalosporin use.  ediate rash, facial/tongue/throat swelling, SOB or lightheadedness with hypotension, tachy  . Isosorbide Nitrate     Other reaction(s): Headache  . Latex Hives  . Ondansetron Other (See Comments)    Severe headache  . Zofran [Ondansetron Hcl] Other (See Comments)    Causes migraines  . Hydromorphone Rash  . Povidone-Iodine Rash    Blistering rash  . Pulmicort  [Budesonide] Itching    Consultations:  teleneurology    Procedures/Studies: DG Chest 2 View  Result Date: 01/02/2020 CLINICAL DATA:  Chest pain EXAM: CHEST - 2 VIEW COMPARISON:  July 09, 2019 FINDINGS: Lungs are clear. Heart size and pulmonary vascularity are within normal limits. Pacemaker leads are attached to the right atrium and right ventricle. No adenopathy. No pneumothorax. No bone lesions. IMPRESSION: Pacemaker leads attached to right atrium and right ventricle. Heart size normal. Lungs clear. Electronically Signed   By: Lowella Grip III M.D.   On: 01/02/2020 12:19   CT Head Wo Contrast  Result Date: 01/23/2020 CLINICAL DATA:  New  onset seizures EXAM: CT HEAD WITHOUT CONTRAST TECHNIQUE: Contiguous axial images were obtained from the base of the skull through the vertex without intravenous contrast. COMPARISON:  None. FINDINGS: Brain: There is no mass, hemorrhage or extra-axial collection. The size and configuration of the ventricles and extra-axial CSF spaces are normal. The brain parenchyma is normal, without acute or chronic infarction. Vascular: No abnormal hyperdensity of the major intracranial arteries or dural venous sinuses. No intracranial atherosclerosis. Skull: The visualized skull base, calvarium and extracranial soft tissues are normal. Sinuses/Orbits: No fluid levels or advanced mucosal thickening of the visualized paranasal sinuses. No mastoid or middle ear effusion. The orbits are normal. IMPRESSION: Normal head CT. Electronically Signed   By: Ulyses Jarred M.D.   On: 01/23/2020 18:24       Subjective: none, patient left AMA before being seen   Discharge Exam: Vitals:   01/24/20 0500 01/24/20 0755  BP: 120/85 117/84  Pulse: 91 94  Resp: (!) 21 16  Temp:  98.9 F (37.2 C)  SpO2: 92% 96%   Vitals:   01/24/20 0400 01/24/20 0430 01/24/20 0500 01/24/20 0755  BP: 120/86 108/74 120/85 117/84  Pulse: 97 93 91 94  Resp:  (!) 23 (!) 21 16  Temp:    98.9 F  (37.2 C)  TempSrc:    Oral  SpO2: 92% 92% 92% 96%  Weight:      Height:        No physical exam was performed as patient left AMA before being seen.    The results of significant diagnostics from this hospitalization (including imaging, microbiology, ancillary and laboratory) are listed below for reference.     Microbiology: Recent Results (from the past 240 hour(s))  SARS Coronavirus 2 by RT PCR (hospital order, performed in [facility] hospital lab) Nasopharyngeal Nasopharyngeal Swab     Status: None   Collection Time: 01/23/20  8:01 PM   Specimen: Nasopharyngeal Swab  Result Value Ref Range Status   SARS Coronavirus 2 NEGATIVE NEGATIVE Final    Comment: (NOTE) SARS-CoV-2 target nucleic acids are NOT DETECTED. The SARS-CoV-2 RNA is generally detectable in upper and lower respiratory specimens during the acute phase of infection. The lowest concentration of SARS-CoV-2 viral copies this assay can detect is 250 copies / mL. A negative result does not preclude SARS-CoV-2 infection and should not be used as the sole basis for treatment or other patient management decisions.  A negative result may occur with improper specimen collection / handling, submission of specimen other than nasopharyngeal swab, presence of viral mutation(s) within the areas targeted by this assay, and inadequate number of viral copies (<250 copies / mL). A negative result must be combined with clinical observations, patient history, and epidemiological information. Fact Sheet for Patients:   StrictlyIdeas.no Fact Sheet for Healthcare Providers: BankingDealers.co.za This test is not yet approved or cleared  by the [place] FDA and has been authorized for detection and/or diagnosis of SARS-CoV-2 by FDA under an Emergency Use Authorization (EUA).  This EUA will remain in effect (meaning this test can be used) for the duration of the COVID-19 declaration  under Section 564(b)(1) of the Act, 21 U.S.C. section 360bbb-3(b)(1), unless the authorization is terminated or revoked sooner. Performed at [facility], [address] [place], [place] [address]      Labs: BNP (last 3 results) Recent Labs    05/10/19 0236 01/02/20 1155  BNP 32.0 0000000   Basic Metabolic Panel: Recent Labs  Lab 01/23/20 1746  NA 140  K 3.4*  CL 99  CO2 24  GLUCOSE 119*  BUN 8  CREATININE 1.07*  CALCIUM 9.2   Liver Function Tests: No results for input(s): AST, ALT, ALKPHOS, BILITOT, PROT, ALBUMIN in the last 168 hours. No results for input(s): LIPASE, AMYLASE in the last 168 hours. No results for input(s): AMMONIA in the last 168 hours. CBC: Recent Labs  Lab 01/23/20 1746  WBC 13.3*  NEUTROABS 9.1*  HGB 14.5  HCT 41.4  MCV 86.8  PLT 299   Cardiac Enzymes: No results for input(s): CKTOTAL, CKMB, CKMBINDEX, TROPONINI in the last 168 hours. BNP: Invalid input(s): POCBNP CBG: No results for input(s): GLUCAP in the last 168 hours. D-Dimer No results for input(s): DDIMER in the last 72 hours. Hgb A1c No results for input(s): HGBA1C in the last 72 hours. Lipid Profile No results for input(s): CHOL, HDL, LDLCALC, TRIG, CHOLHDL, LDLDIRECT in the last 72 hours. Thyroid function studies No results for input(s): TSH, T4TOTAL, T3FREE, THYROIDAB in the last 72 hours.  Invalid input(s): FREET3 Anemia work up No results for input(s): VITAMINB12, FOLATE, FERRITIN, TIBC, IRON, RETICCTPCT in the last 72 hours. Urinalysis    Component Value Date/Time   COLORURINE YELLOW (A) 01/02/2020 1555   APPEARANCEUR CLEAR (A) 01/02/2020 1555   APPEARANCEUR Clear 09/02/2014 2312   LABSPEC 1.013 01/02/2020 1555   LABSPEC 1.013 09/02/2014 2312   PHURINE 5.0 01/02/2020 1555   GLUCOSEU NEGATIVE 01/02/2020 1555   GLUCOSEU Negative 09/02/2014 2312   HGBUR NEGATIVE 01/02/2020 [place] 01/02/2020 1555   BILIRUBINUR Negative 09/02/2014  2312   KETONESUR NEGATIVE 01/02/2020 1555   PROTEINUR NEGATIVE 01/02/2020 1555   NITRITE NEGATIVE 01/02/2020 1555   LEUKOCYTESUR NEGATIVE 01/02/2020 1555   LEUKOCYTESUR Negative 09/02/2014 2312   Sepsis Labs Invalid input(s): PROCALCITONIN,  WBC,  LACTICIDVEN Microbiology Recent Results (from the past 240 hour(s))  SARS Coronavirus 2 by RT PCR (hospital order, performed in [place] hospital lab) Nasopharyngeal Nasopharyngeal Swab     Status: None   Collection Time: 01/23/20  8:01 PM   Specimen: Nasopharyngeal Swab  Result Value Ref Range Status   SARS Coronavirus 2 NEGATIVE NEGATIVE Final    Comment: (NOTE) SARS-CoV-2 target nucleic acids are NOT DETECTED. The SARS-CoV-2 RNA is generally detectable in upper and lower respiratory specimens during the acute phase of infection. The lowest concentration of SARS-CoV-2 viral copies this assay can detect is 250 copies / mL. A negative result does not preclude SARS-CoV-2 infection and should not be used as the sole basis for treatment or other patient management decisions.  A negative result may occur with improper specimen collection / handling, submission of specimen other than nasopharyngeal swab, presence of viral mutation(s) within the areas targeted by this assay, and inadequate number of viral copies (<250 copies / mL). A negative result must be combined with clinical observations, patient history, and epidemiological information. Fact Sheet for Patients:   StrictlyIdeas.no Fact Sheet for Healthcare Providers: BankingDealers.co.za This test is not yet approved or cleared  by the [place] FDA and has been authorized for detection and/or diagnosis of SARS-CoV-2 by FDA under an Emergency Use Authorization (EUA).  This EUA will remain in effect (meaning this test can be used) for the duration of the COVID-19 declaration under Section 564(b)(1) of the Act, 21 U.S.C. section  360bbb-3(b)(1), unless the authorization is terminated or revoked sooner. Performed at [facility], [address] [place], [place] [address]  Time coordinating discharge: Over 30 minutes  SIGNED:   Ezekiel Slocumb, DO [facility] 01/24/2020, 3:54 PM   If 7PM-7AM, please contact night-coverage [email or web address]

## 2020-01-25 LAB — HEMOGLOBIN A1C
Hgb A1c MFr Bld: 5.9 % — ABNORMAL HIGH (ref 4.8–5.6)
Mean Plasma Glucose: 123 mg/dL

## 2020-01-29 ENCOUNTER — Emergency Department
Admission: EM | Admit: 2020-01-29 | Discharge: 2020-01-29 | Disposition: A | Discharge status: Home / Self Care | Payer: Medicare Other | Attending: Emergency Medicine | Admitting: Emergency Medicine

## 2020-01-29 ENCOUNTER — Emergency Department: Payer: Medicare Other

## 2020-01-29 ENCOUNTER — Other Ambulatory Visit: Payer: Self-pay

## 2020-01-29 DIAGNOSIS — Z5321 Procedure and treatment not carried out due to patient leaving prior to being seen by health care provider: Secondary | ICD-10-CM | POA: Insufficient documentation

## 2020-01-29 DIAGNOSIS — R0602 Shortness of breath: Secondary | ICD-10-CM | POA: Insufficient documentation

## 2020-01-29 DIAGNOSIS — R531 Weakness: Secondary | ICD-10-CM | POA: Diagnosis not present

## 2020-01-29 DIAGNOSIS — R569 Unspecified convulsions: Secondary | ICD-10-CM | POA: Diagnosis not present

## 2020-01-29 LAB — CBC
HCT: 38.4 % (ref 36.0–46.0)
Hemoglobin: 13.4 g/dL (ref 12.0–15.0)
MCH: 30.7 pg (ref 26.0–34.0)
MCHC: 34.9 g/dL (ref 30.0–36.0)
MCV: 87.9 fL (ref 80.0–100.0)
Platelets: 290 10*3/uL (ref 150–400)
RBC: 4.37 MIL/uL (ref 3.87–5.11)
RDW: 13 % (ref 11.5–15.5)
WBC: 10 10*3/uL (ref 4.0–10.5)
nRBC: 0 % (ref 0.0–0.2)

## 2020-01-29 LAB — BASIC METABOLIC PANEL
Anion gap: 14 (ref 5–15)
BUN: 10 mg/dL (ref 6–20)
CO2: 27 mmol/L (ref 22–32)
Calcium: 8.5 mg/dL — ABNORMAL LOW (ref 8.9–10.3)
Chloride: 101 mmol/L (ref 98–111)
Creatinine, Ser: 1.12 mg/dL — ABNORMAL HIGH (ref 0.44–1.00)
GFR calc Af Amer: >60 mL/min (ref >60)
GFR calc non Af Amer: 57 mL/min — ABNORMAL LOW (ref >60)
Glucose, Bld: 95 mg/dL (ref 70–99)
Potassium: 3.5 mmol/L (ref 3.5–5.1)
Sodium: 142 mmol/L (ref 135–145)

## 2020-01-29 NOTE — ED Triage Notes (Addendum)
Pt comes via pOV from home with c/o SOB. Pt also states recent seizure and was evaluated here at hospital.  Pt states SOB that has been going on for few days. Pt states she has some back pain due to falling from seizure.  Pt states she just feels weak and tired. Pt states pain when she takes a deep breath

## 2020-02-06 ENCOUNTER — Emergency Department
Admission: EM | Admit: 2020-02-06 | Discharge: 2020-02-06 | Discharge status: Against Medical Advice | Payer: Medicare Other

## 2020-02-06 NOTE — ED Notes (Signed)
Pt arrived in stat desk line in wheelchair with spouse who were both unmasked and were not happy about putting on a mask by first nurse.  Pt holding her head and states she had a seizure.  Pt alert and oriented. No resp distress noted.    Skin warm and dry.  Husband states I have an emergency and she needs to go straight back.  Pt informed we would get them registered and triaged.  Husband rude to registration staff and while waiting for triage, husband was yelling at security officer April, you better call some fucking back up.  When back up arrived, husband stated "you talking to me."  Patient and family member decided to leave without being triaged and states they will call ems and left the hospital.

## 2020-02-10 ENCOUNTER — Encounter: Payer: Self-pay | Admitting: Emergency Medicine

## 2020-02-10 ENCOUNTER — Emergency Department
Admission: EM | Admit: 2020-02-10 | Discharge: 2020-02-10 | Disposition: A | Discharge status: Home / Self Care | Payer: Medicare Other | Attending: Emergency Medicine | Admitting: Emergency Medicine

## 2020-02-10 ENCOUNTER — Other Ambulatory Visit: Payer: Self-pay

## 2020-02-10 DIAGNOSIS — R569 Unspecified convulsions: Secondary | ICD-10-CM | POA: Insufficient documentation

## 2020-02-10 DIAGNOSIS — Z5321 Procedure and treatment not carried out due to patient leaving prior to being seen by health care provider: Secondary | ICD-10-CM | POA: Diagnosis not present

## 2020-02-10 LAB — CBC
HCT: 40 % (ref 36.0–46.0)
Hemoglobin: 13.5 g/dL (ref 12.0–15.0)
MCH: 30 pg (ref 26.0–34.0)
MCHC: 33.8 g/dL (ref 30.0–36.0)
MCV: 88.9 fL (ref 80.0–100.0)
Platelets: 313 10*3/uL (ref 150–400)
RBC: 4.5 MIL/uL (ref 3.87–5.11)
RDW: 13.1 % (ref 11.5–15.5)
WBC: 9.7 10*3/uL (ref 4.0–10.5)
nRBC: 0 % (ref 0.0–0.2)

## 2020-02-10 LAB — BASIC METABOLIC PANEL
Anion gap: 11 (ref 5–15)
BUN: 15 mg/dL (ref 6–20)
CO2: 27 mmol/L (ref 22–32)
Calcium: 9.2 mg/dL (ref 8.9–10.3)
Chloride: 99 mmol/L (ref 98–111)
Creatinine, Ser: 1.13 mg/dL — ABNORMAL HIGH (ref 0.44–1.00)
GFR calc Af Amer: >60 mL/min (ref >60)
GFR calc non Af Amer: 56 mL/min — ABNORMAL LOW (ref >60)
Glucose, Bld: 166 mg/dL — ABNORMAL HIGH (ref 70–99)
Potassium: 3.8 mmol/L (ref 3.5–5.1)
Sodium: 137 mmol/L (ref 135–145)

## 2020-02-10 NOTE — ED Notes (Signed)
No answer when called several times from lobby 

## 2020-02-10 NOTE — ED Triage Notes (Addendum)
Pt presents to ED via POV with c/o seizure. Pt states this is the 4th seizure she has had, states currently takes Keppra for seizures. Pt with noted slow responses in triage at this time, however is alert and answering questions.   Pt reports seizure-like activity lasting approx 15 mins at home, no medications given PTA for seizures.

## 2020-02-11 ENCOUNTER — Emergency Department: Payer: Medicare Other

## 2020-02-11 ENCOUNTER — Emergency Department
Admission: EM | Admit: 2020-02-11 | Discharge: 2020-02-11 | Disposition: A | Discharge status: Home / Self Care | Payer: Medicare Other | Attending: Emergency Medicine | Admitting: Emergency Medicine

## 2020-02-11 ENCOUNTER — Other Ambulatory Visit: Payer: Self-pay

## 2020-02-11 ENCOUNTER — Encounter: Payer: Self-pay | Admitting: Emergency Medicine

## 2020-02-11 DIAGNOSIS — Z85020 Personal history of malignant carcinoid tumor of stomach: Secondary | ICD-10-CM | POA: Insufficient documentation

## 2020-02-11 DIAGNOSIS — A084 Viral intestinal infection, unspecified: Secondary | ICD-10-CM | POA: Diagnosis not present

## 2020-02-11 DIAGNOSIS — R569 Unspecified convulsions: Secondary | ICD-10-CM | POA: Diagnosis not present

## 2020-02-11 DIAGNOSIS — I110 Hypertensive heart disease with heart failure: Secondary | ICD-10-CM | POA: Diagnosis not present

## 2020-02-11 DIAGNOSIS — I5022 Chronic systolic (congestive) heart failure: Secondary | ICD-10-CM | POA: Insufficient documentation

## 2020-02-11 DIAGNOSIS — J45909 Unspecified asthma, uncomplicated: Secondary | ICD-10-CM | POA: Diagnosis not present

## 2020-02-11 DIAGNOSIS — Z79899 Other long term (current) drug therapy: Secondary | ICD-10-CM | POA: Insufficient documentation

## 2020-02-11 DIAGNOSIS — E119 Type 2 diabetes mellitus without complications: Secondary | ICD-10-CM | POA: Diagnosis not present

## 2020-02-11 DIAGNOSIS — Z95810 Presence of automatic (implantable) cardiac defibrillator: Secondary | ICD-10-CM | POA: Insufficient documentation

## 2020-02-11 DIAGNOSIS — Z20822 Contact with and (suspected) exposure to covid-19: Secondary | ICD-10-CM | POA: Insufficient documentation

## 2020-02-11 DIAGNOSIS — Z91040 Latex allergy status: Secondary | ICD-10-CM | POA: Diagnosis not present

## 2020-02-11 DIAGNOSIS — R1110 Vomiting, unspecified: Secondary | ICD-10-CM | POA: Diagnosis present

## 2020-02-11 LAB — COMPREHENSIVE METABOLIC PANEL
ALT: 18 U/L (ref 0–44)
AST: 16 U/L (ref 15–41)
Albumin: 3.9 g/dL (ref 3.5–5.0)
Alkaline Phosphatase: 98 U/L (ref 38–126)
Anion gap: 11 (ref 5–15)
BUN: 17 mg/dL (ref 6–20)
CO2: 29 mmol/L (ref 22–32)
Calcium: 9.3 mg/dL (ref 8.9–10.3)
Chloride: 102 mmol/L (ref 98–111)
Creatinine, Ser: 1.15 mg/dL — ABNORMAL HIGH (ref 0.44–1.00)
GFR calc Af Amer: >60 mL/min (ref >60)
GFR calc non Af Amer: 55 mL/min — ABNORMAL LOW (ref >60)
Glucose, Bld: 139 mg/dL — ABNORMAL HIGH (ref 70–99)
Potassium: 3.8 mmol/L (ref 3.5–5.1)
Sodium: 142 mmol/L (ref 135–145)
Total Bilirubin: 0.4 mg/dL (ref 0.3–1.2)
Total Protein: 7.4 g/dL (ref 6.5–8.1)

## 2020-02-11 LAB — CBC WITH DIFFERENTIAL/PLATELET
Abs Immature Granulocytes: 0.11 10*3/uL — ABNORMAL HIGH (ref 0.00–0.07)
Basophils Absolute: 0.1 10*3/uL (ref 0.0–0.1)
Basophils Relative: 0 %
Eosinophils Absolute: 0.1 10*3/uL (ref 0.0–0.5)
Eosinophils Relative: 1 %
HCT: 38.2 % (ref 36.0–46.0)
Hemoglobin: 12.9 g/dL (ref 12.0–15.0)
Immature Granulocytes: 1 %
Lymphocytes Relative: 28 %
Lymphs Abs: 3.2 10*3/uL (ref 0.7–4.0)
MCH: 30.6 pg (ref 26.0–34.0)
MCHC: 33.8 g/dL (ref 30.0–36.0)
MCV: 90.5 fL (ref 80.0–100.0)
Monocytes Absolute: 0.8 10*3/uL (ref 0.1–1.0)
Monocytes Relative: 7 %
Neutro Abs: 7.1 10*3/uL (ref 1.7–7.7)
Neutrophils Relative %: 63 %
Platelets: 284 10*3/uL (ref 150–400)
RBC: 4.22 MIL/uL (ref 3.87–5.11)
RDW: 13.4 % (ref 11.5–15.5)
WBC: 11.3 10*3/uL — ABNORMAL HIGH (ref 4.0–10.5)
nRBC: 0 % (ref 0.0–0.2)

## 2020-02-11 LAB — RESP PANEL BY RT PCR (RSV, FLU A&B, COVID)
Influenza A by PCR: NEGATIVE
Influenza B by PCR: NEGATIVE
Respiratory Syncytial Virus by PCR: NEGATIVE
SARS Coronavirus 2 by RT PCR: NEGATIVE

## 2020-02-11 LAB — PROCALCITONIN: Procalcitonin: <0.1 ng/mL

## 2020-02-11 MED ORDER — METOCLOPRAMIDE HCL 10 MG PO TABS
10.0000 mg | ORAL_TABLET | Freq: Three times a day (TID) | ORAL | 0 refills | Status: DC | PRN
Start: 2020-02-11 — End: 2021-06-22

## 2020-02-11 MED ORDER — LACTATED RINGERS IV BOLUS
1000.0000 mL | Freq: Once | INTRAVENOUS | Status: AC
  Administered 2020-02-11: 1000 mL via INTRAVENOUS

## 2020-02-11 MED ORDER — MORPHINE SULFATE (PF) 4 MG/ML IV SOLN
6.0000 mg | Freq: Once | INTRAVENOUS | Status: AC
  Administered 2020-02-11: 6 mg via INTRAVENOUS
  Filled 2020-02-11: qty 2

## 2020-02-11 MED ORDER — OXYCODONE-ACETAMINOPHEN 5-325 MG PO TABS: 1.0000 | ORAL_TABLET | ORAL | 0 refills | Status: DC | PRN

## 2020-02-11 MED ORDER — METOCLOPRAMIDE HCL 5 MG/ML IJ SOLN
10.0000 mg | Freq: Once | INTRAMUSCULAR | Status: AC
  Administered 2020-02-11: 10 mg via INTRAVENOUS
  Filled 2020-02-11: qty 2

## 2020-02-11 NOTE — ED Notes (Signed)
Pt reports she is on a new PO medication from [facility] for a tumor in her abd. Pt started this medication 4 weeks ago and has since started having seizures without a known cause. First EEG was reportedly normal by patient. No neurologic deficits noted currently.   Pt reports diarrhea x 12 days. No vomiting or fevers.

## 2020-02-11 NOTE — ED Provider Notes (Signed)
[facility] Emergency Department Provider Note  ____________________________________________  Time seen: Approximately 5:17 AM  I have reviewed the triage vital signs and the nursing notes.   HISTORY  Chief Complaint Seizures   HPI Brittany Nguyen is a 54 y.o. female with a history of carcinoid tumor, asthma, CHF with EF 20% s/p AICD, COPD,  diverticulitis, and recently diagnosed seizures presents for evaluation of vomiting and diarrhea.  Patient reports 2 days of several daily episodes of nonbloody nonbilious emesis and watery diarrhea.  No melena, no coffee-ground emesis, no hematemesis.  Patient's granddaughter has been exposed to someone with Covid.  Patient was exposed to her granddaughter after that.  Patient has not received Covid vaccines.  She denies loss of taste or smell, shortness of breath or chest pain.  She is complaining of abdominal pain consistent with her chronic pain due to carcinoid tumor.  No fever.  Of note, patient started to have seizures a month ago.  She has been seen by neurology with a negative CT of her head and 4-hour EEG.  She was started on Keppra, 1000 mg twice daily.  She endorses compliance with it.  Patient did not undergo an MRI because she has a pacemaker.  She reports having 4 episodes of seizures since being started on Keppra.  She reports that she came into the ED today not because she was having seizures but because of the vomiting and diarrhea.  Past Medical History:  Diagnosis Date  . Asthma 2013  . 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 CHF (congestive heart failure) ([place])   . COPD (chronic obstructive pulmonary disease) ([place])   . Diverticulitis 2010  . DVT (deep venous thrombosis) ([place])   . Endometriosis 1990  . Heart disease 2013  . Hx MRSA infection   . Iron deficiency   . Lump or mass in breast   . Restless leg   . Stomach cancer [facility])     Patient Active Problem List   Diagnosis Date Noted   . Non-ischemic cardiomyopathy ([place]) 01/23/2020  . 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/23/2020  . Seizure ([place]) 01/23/2020  . 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 Chronic use of opiate for therapeutic purpose 01/23/2020  . Type 2 diabetes mellitus without complication ([place]) 19/50/9326  . Iron deficiency anemia 03/27/2018  . Anemia 03/26/2018  . Abdominal pain 02/20/2018  . Unstable angina ([place])   . Encounter for anticoagulation discussion and counseling   . C. difficile colitis 01/14/2018  . GIB (gastrointestinal bleeding) 01/08/2018  . SOB (shortness of breath) 11/01/2017  . Influenza A 10/29/2017  . Acute respiratory failure ([place]) 09/24/2017  . Sepsis ([place]) 07/03/2017  . COPD (chronic obstructive pulmonary disease) ([place]) 04/26/2017  . Community acquired pneumonia 04/25/2017  . Chronic systolic heart failure ([place]) 02/03/2017  . Hypotension 02/03/2017  . Chest pain 12/09/2016  . RLS (restless legs syndrome) 12/09/2016  . Cough, persistent 11/22/2015  . Hypokalemia 11/22/2015  . Leukocytosis 11/22/2015  . Dyspnea 11/21/2015  . Respiratory distress 09/19/2015  . Breast pain 01/11/2013  . Hx MRSA infection   . Lump or mass in breast   . GOITER, MULTINODULAR 01/20/2009  . HYPOGLYCEMIA, UNSPECIFIED 01/20/2009  . GERD 01/20/2009  . DIVERTICULITIS OF COLON 01/20/2009    Past Surgical History:  Procedure Laterality Date  . ABDOMINAL HYSTERECTOMY     age 28  . BREAST BIOPSY Right 2014   benign  .  CARDIAC DEFIBRILLATOR PLACEMENT    . CARDIAC DEFIBRILLATOR PLACEMENT    . CESAREAN SECTION    . CHOLECYSTECTOMY    . COLECTOMY    . COLONOSCOPY WITH PROPOFOL N/A 02/22/2018   Procedure: COLONOSCOPY WITH PROPOFOL;  Surgeon: Toledo, Benay Pike, MD;  Location: [facility] ENDOSCOPY;  Service: Gastroenterology;  Laterality: N/A;  . ESOPHAGOGASTRODUODENOSCOPY (EGD) WITH PROPOFOL N/A 02/22/2018   Procedure:  ESOPHAGOGASTRODUODENOSCOPY (EGD) WITH PROPOFOL;  Surgeon: Toledo, Benay Pike, MD;  Location: [facility] ENDOSCOPY;  Service: Gastroenterology;  Laterality: N/A;  . NASAL SINUS SURGERY  2012  . OOPHORECTOMY      Prior to Admission medications   Medication Sig Start Date End Date Taking? Authorizing Provider  albuterol (ACCUNEB) 1.25 MG/3ML nebulizer solution Take 3 mLs by nebulization every 6 (six) hours as needed for wheezing. 11/09/17   [provider]  albuterol (VENTOLIN HFA) 108 (90 Base) MCG/ACT inhaler Inhale 2 puffs into the lungs every 6 (six) hours as needed for shortness of breath. 08/28/16   [provider]  beclomethasone (QVAR) 80 MCG/ACT inhaler Inhale 1 puff into the lungs 2 (two) times daily. 06/12/17   [provider]  busPIRone (BUSPAR) 15 MG tablet Take 15 mg by mouth 2 (two) times daily as needed.  05/08/18   [provider]  DULoxetine (CYMBALTA) 30 MG capsule Take 30 mg by mouth daily. 10/27/19   [provider]  ENTRESTO 24-26 MG Take 0.5 tablets by mouth 2 (two) times daily. 01/08/20   [provider]  famotidine (PEPCID) 20 MG tablet Take 1 tablet (20 mg total) by mouth 2 (two) times daily. 03/15/19   Carrie Mew, MD  FARXIGA 5 MG TABS tablet Take 5 mg by mouth daily. 11/11/19   [provider]  fentaNYL (DURAGESIC) 50 MCG/HR Place 1 patch onto the skin every 3 (three) days. 10/08/18   Epifanio Lesches, MD  furosemide (LASIX) 40 MG tablet Take 80 mg by mouth 2 (two) times daily.  07/09/18   [provider]  hydrOXYzine (ATARAX/VISTARIL) 25 MG tablet Take 25 mg by mouth every 6 (six) hours as needed for itching. 04/13/19   [provider]  levothyroxine (SYNTHROID) 175 MCG tablet Take 175 mcg by mouth daily before breakfast.     [provider]  LORazepam (ATIVAN) 1 MG tablet Take 1 mg by mouth 4 (four) times daily as needed. 12/20/19   [provider]  losartan (COZAAR) 25 MG tablet Take  25 mg by mouth daily.     [provider]  metoCLOPramide (REGLAN) 10 MG tablet Take 1 tablet (10 mg total) by mouth every 8 (eight) hours as needed for up to 3 days for nausea. 02/11/20 02/14/20  Rudene Re, MD  metoprolol succinate (TOPROL-XL) 50 MG 24 hr tablet Take 50 mg by mouth 2 (two) times daily. 01/25/19   [provider]  mirtazapine (REMERON) 15 MG tablet Take 15 mg by mouth at bedtime. 04/26/19 05/26/24  [provider]  morphine (MSIR) 15 MG tablet Take 15 mg by mouth as needed for severe pain.     [provider]  Multiple Vitamins-Minerals (ONE-A-DAY WOMENS PO) Take 1 tablet by mouth daily.    [provider]  nitroGLYCERIN (NITROSTAT) 0.4 MG SL tablet Place 1 tablet (0.4 mg total) under the tongue every 5 (five) minutes as needed for chest pain. 01/24/17   Henreitta Leber, MD  omeprazole (PRILOSEC) 40 MG capsule Take 40 mg by mouth 2 (two) times daily. 10/19/18  [provider]  oxyCODONE (OXY IR/ROXICODONE) 5 MG immediate release tablet Take 5-10 mg by mouth every 4 (four) hours as needed for pain. 04/11/19   [provider]  oxyCODONE-acetaminophen (PERCOCET/ROXICET) 5-325 MG tablet Take 1 tablet by mouth every 4 (four) hours as needed.  01/17/20   [provider]  Pancrelipase, Lip-Prot-Amyl, (CREON) 24000-76000 units CPEP Take 3-4 capsules by mouth 3 (three) times daily with meals. 04/01/19   [provider]  pantoprazole (PROTONIX) 40 MG tablet Take 40 mg by mouth 2 (two) times daily. 12/20/19   [provider]  potassium chloride SA (KLOR-CON M20) 20 MEQ tablet Take 1 tablet (20 mEq total) by mouth 2 (two) times daily for 7 days. 07/09/19 01/23/20  Carrie Mew, MD  promethazine (PHENERGAN) 25 MG tablet Take 1 tablet (25 mg total) by mouth every 6 (six) hours as needed. 07/09/19   Carrie Mew, MD  rOPINIRole (REQUIP) 4 MG tablet Take 4 mg by mouth at bedtime. 01/30/19   [provider]  spironolactone (ALDACTONE) 50 MG tablet Take 50 mg by mouth daily. 11/26/18   [provider]  sucralfate (CARAFATE) 1 g tablet Take 1 tablet (1 g total) by mouth 4 (four) times daily. 03/15/19   Carrie Mew, MD  vitamin B-12 (CYANOCOBALAMIN) 1000 MCG tablet Take 1,000 mcg by mouth daily.    [provider]    Allergies Contrast media [iodinated diagnostic agents], Lidocaine, Metrizamide, Penicillins, Isosorbide nitrate, Latex, Ondansetron, Zofran [ondansetron hcl], Hydromorphone, Povidone-iodine, and Pulmicort [budesonide]  Family History  Problem Relation Age of Onset  . Cancer Mother 30       ovarian  . CAD Mother   . Cancer Father 78       brain  . CAD Father   . Cancer Daughter 18       skin  . Cancer Maternal Aunt 46       breast  . Leukemia Paternal Grandfather     Social History Social History   Tobacco Use  . Smoking status: Never Smoker  . Smokeless tobacco: Never Used  Substance Use Topics  . Alcohol use: No  . Drug use: No    Review of Systems  Constitutional: Negative for fever. Eyes: Negative for visual changes. ENT: Negative for sore throat. Neck: No neck pain  Cardiovascular: Negative for chest pain. Respiratory: Negative for shortness of breath. Gastrointestinal: + abdominal pain, vomiting and diarrhea. Genitourinary: Negative for dysuria. Musculoskeletal: Negative for back pain. Skin: Negative for rash. Neurological: Negative for headaches, weakness or numbness. Psych: No SI or HI  ____________________________________________   PHYSICAL EXAM:  VITAL SIGNS: ED Triage Vitals  Enc Vitals Group     BP 02/11/20 0046 103/73     Pulse Rate 02/11/20 0046 93     Resp 02/11/20 0046 18     Temp 02/11/20 0046 98.1 F (36.7 C)     Temp Source 02/11/20 0046 Oral     SpO2 02/11/20 0046 95 %     Weight 02/11/20 0042 201 lb 15.1 oz (91.6 kg)     Height 02/11/20 0042 5\' 5"  (1.651 m)     Head Circumference --       Peak Flow --      Pain Score 02/11/20 0042 0     Pain Loc --      Pain Edu? --      Excl. in [place]? --     Constitutional: Alert and oriented. Well appearing and in no apparent distress.  HEENT:      Head: Normocephalic and atraumatic.         Eyes: Conjunctivae are normal. Sclera is non-icteric.       Mouth/Throat: Mucous membranes are moist.       Neck: Supple with no signs of meningismus. Cardiovascular: Regular rate and rhythm. No murmurs, gallops, or rubs.  Respiratory: Normal respiratory effort. Lungs are clear to auscultation bilaterally.  Gastrointestinal: Soft, non tender, and non distended with positive bowel sounds. No rebound or guarding. Musculoskeletal: Nontender with normal range of motion in all extremities. No edema, cyanosis, or erythema of extremities. Neurologic: Normal speech and language. Face is symmetric. Moving all extremities. No gross focal neurologic deficits are appreciated. Skin: Skin is warm, dry and intact. No rash noted. Psychiatric: Mood and affect are normal. Speech and behavior are normal.  ____________________________________________   LABS (all labs ordered are listed, but only abnormal results are displayed)  Labs Reviewed  CBC WITH DIFFERENTIAL/PLATELET - Abnormal; Notable for the following components:      Result Value   WBC 11.3 (*)    Abs Immature Granulocytes 0.11 (*)    All other components within normal limits  COMPREHENSIVE METABOLIC PANEL - Abnormal; Notable for the following components:   Glucose, Bld 139 (*)    Creatinine, Ser 1.15 (*)    GFR calc non Af Amer 55 (*)    All other components within normal limits  RESP PANEL BY RT PCR (RSV, FLU A&B, COVID)  PROCALCITONIN  LEVETIRACETAM LEVEL   ____________________________________________  EKG  ED ECG REPORT I, Rudene Re, the attending physician, personally viewed and interpreted this ECG.  ED ECG REPORT I, Rudene Re, the attending physician, personally viewed  and interpreted this ECG.  Normal sinus rhythm, rate of 94, first degree AV block, prolonged QTC, left axis deviation, no ST elevations or depressions. No significant changes when compared to prior. ____________________________________________  RADIOLOGY  I have personally reviewed the images performed during this visit and I agree with the Radiologist's read.   Interpretation by Radiologist:  DG Chest Portable 1 View  Result Date: 02/11/2020 CLINICAL DATA:  COVID like symptoms EXAM: PORTABLE CHEST 1 VIEW COMPARISON:  Radiograph 01/29/2020 FINDINGS: Low lung volumes with some hazy and streaky opacities in the bases favoring atelectasis accentuated by the breast tissue. No focal consolidative opacity. No pneumothorax or visible effusion. Prominence of cardiomediastinal silhouette is likely related to the portable technique. AICD battery pack overlies the left chest wall with leads at the right atrium and cardiac apex. No acute osseous or soft tissue abnormality. IMPRESSION: Low lung volumes with some hazy and streaky opacities in the bases favoring atelectasis accentuated by the body habitus though underlying early atypical infection is difficult to exclude. Electronically Signed   By: Lovena Le M.D.   On: 02/11/2020 05:20    ____________________________________________   PROCEDURES  Procedure(s) performed:yes .1-3 Lead EKG Interpretation Performed by: Rudene Re, MD Authorized by: Rudene Re, MD     Interpretation: non-specific     ECG rate assessment: normal     Rhythm: sinus rhythm     Conduction: abnormal     Abnormal conduction: 1st degree AV block     Critical Care performed:  None ____________________________________________   INITIAL IMPRESSION / ASSESSMENT AND PLAN / ED COURSE   54 y.o. female with a history of carcinoid tumor, asthma, CHF with EF 20% s/p AICD, COPD,  diverticulitis, and recently diagnosed seizures presents for evaluation of vomiting  and diarrhea x 2  days after being exposed to a family member who was exposed to a Covid patient.  Patient has not received Covid shot.  She is otherwise well-appearing and in no distress with normal vital signs, normal work of breathing, normal sats, afebrile, abdomen is soft with mild diffuse tenderness.  Patient reports the pain is consistent with her chronic pain due to carcinoid tumor.  Old medical records reviewed showing -4-hour EEG and CT head.  Patient continues to have seizures even after being started on Keppra.  Will check a Keppra level.  Will check labs to rule out any electrolyte abnormalities.  Will check Covid and flu.  Will give gentle hydration due to EF of 20%, Zofran for nausea, and morphine for chronic pain from malignancy.  We will get a chest x-ray.  Old medical records reviewed.   _________________________ 7:09 AM on 02/11/2020 ----------------------------------------- Labs with no electrolytes abnormalities. Procalcitonin negative. Mild leukocytosis consistent with viral gastroenteritis picture. Covid and Flu pending. Patient feels markedly improved and tolerating PO with no further episodes of emesis. Plan to dc home after swabs are back. Patient requesting a short course of narcotics until she sees her doctor later this week. With history of carcinoid tumor, I believe it is reasonable to provide her with a few days worth of percocet for pain control. Discussed bland diet, increased PO hydration, and my standard return precautions with patient.     _____________________________________________ Please note:  Patient was evaluated in Emergency Department today for the symptoms described in the history of present illness. Patient was evaluated in the context of the global COVID-19 pandemic, which necessitated consideration that the patient might be at risk for infection with the SARS-CoV-2 virus that causes COVID-19. Institutional protocols and algorithms that pertain to the  evaluation of patients at risk for COVID-19 are in a state of rapid change based on information released by regulatory bodies including the CDC and federal and state organizations. These policies and algorithms were followed during the patient's care in the ED.  Some ED evaluations and interventions may be delayed as a result of limited staffing during the pandemic.   [place] Controlled Substance Database was reviewed by me. ____________________________________________   FINAL CLINICAL IMPRESSION(S) / ED DIAGNOSES   Final diagnoses:  Viral gastroenteritis  Seizure ([place])      NEW MEDICATIONS STARTED DURING THIS VISIT:  ED Discharge Orders         Ordered    metoCLOPramide (REGLAN) 10 MG tablet  Every 8 hours PRN     02/11/20 0647           Note:  This document was prepared using Dragon voice recognition software and may include unintentional dictation errors.    Alfred Levins, [place], MD 02/11/20 [phone]

## 2020-02-11 NOTE — ED Notes (Addendum)
LR stopped at 250 mL per MD order after reviewing most recent ECHO and EF.

## 2020-02-11 NOTE — ED Triage Notes (Signed)
Pt to triage via w/c with no distress noted, mask in place; pt here earlier tonight but left before being seen due to long wait; pt is returning to be evaluated for c/o seizures

## 2020-02-11 NOTE — ED Notes (Signed)
Pt ambulatory to bedside toilet without difficulty or distress.

## 2020-02-11 NOTE — Discharge Instructions (Signed)
Continue to follow seizure precautions as discussed with you by your Neurologist including not driving or operating heavy machinery until your seizures are well controlled. Taking reglan as needed for nausea and vomit. Push fluids. Bland diet until symptoms are resolved. Return to the ER for dehydration, abdominal pain, fever, chest pain, shortness of breath. Otherwise follow-up with your neurologist in 2 days and your primary care doctor in 2 days.

## 2020-02-12 ENCOUNTER — Emergency Department
Admission: EM | Admit: 2020-02-12 | Discharge: 2020-02-12 | Discharge status: Against Medical Advice | Payer: Medicare Other | Attending: Emergency Medicine | Admitting: Emergency Medicine

## 2020-02-12 ENCOUNTER — Encounter: Payer: Self-pay | Admitting: Emergency Medicine

## 2020-02-12 ENCOUNTER — Other Ambulatory Visit: Payer: Self-pay

## 2020-02-12 DIAGNOSIS — M791 Myalgia, unspecified site: Secondary | ICD-10-CM | POA: Diagnosis not present

## 2020-02-12 DIAGNOSIS — J449 Chronic obstructive pulmonary disease, unspecified: Secondary | ICD-10-CM | POA: Diagnosis not present

## 2020-02-12 DIAGNOSIS — Z79899 Other long term (current) drug therapy: Secondary | ICD-10-CM | POA: Diagnosis not present

## 2020-02-12 DIAGNOSIS — Z7984 Long term (current) use of oral hypoglycemic drugs: Secondary | ICD-10-CM | POA: Diagnosis not present

## 2020-02-12 DIAGNOSIS — I5022 Chronic systolic (congestive) heart failure: Secondary | ICD-10-CM | POA: Diagnosis not present

## 2020-02-12 DIAGNOSIS — R52 Pain, unspecified: Secondary | ICD-10-CM

## 2020-02-12 DIAGNOSIS — E119 Type 2 diabetes mellitus without complications: Secondary | ICD-10-CM | POA: Diagnosis not present

## 2020-02-12 NOTE — ED Notes (Signed)
Patient not present in room when going to complete orders. Patient called with no answer by first nurse.

## 2020-02-12 NOTE — ED Triage Notes (Signed)
Pt to triage via w/c with no distress noted, mask in place; pt reports "I hurt from my head down to my toes, every since I left here"; pt was here twice yeterday

## 2020-02-12 NOTE — ED Provider Notes (Signed)
[facility] Emergency Department Provider Note   ____________________________________________   First MD Initiated Contact with Patient 02/12/20 0805     (approximate)  I have reviewed the triage vital signs and the nursing notes.   HISTORY  Chief Complaint Generalized Body Aches    HPI Brittany Nguyen is a 54 y.o. female who comes in reporting that she is aching from her head to her toes all over.  Generalized achiness in her body is quivering all over.  She was here yesterday twice and had a number of tests done.  Chest x-ray was hazy in 1 place but tests were negative.  Patient returns today since she is aching worse.  She says she has a little cough nonproductive.  No other complaints.  No focal pain.         Past Medical History:  Diagnosis Date  . Asthma 2013  . 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 CHF (congestive heart failure) ([place])   . COPD (chronic obstructive pulmonary disease) ([place])   . Diverticulitis 2010  . DVT (deep venous thrombosis) ([place])   . Endometriosis 1990  . Heart disease 2013  . Hx MRSA infection   . Iron deficiency   . Lump or mass in breast   . Restless leg   . Stomach cancer [facility])     Patient Active Problem List   Diagnosis Date Noted  . Non-ischemic cardiomyopathy ([place]) 01/23/2020  . 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/23/2020  . Seizure ([place]) 01/23/2020  . 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 Chronic use of opiate for therapeutic purpose 01/23/2020  . Type 2 diabetes mellitus without complication ([place]) 77/82/4235  . Iron deficiency anemia 03/27/2018  . Anemia 03/26/2018  . Abdominal pain 02/20/2018  . Unstable angina ([place])   . Encounter for anticoagulation discussion and counseling   . C. difficile colitis 01/14/2018  . GIB (gastrointestinal bleeding) 01/08/2018  . SOB (shortness of breath)  11/01/2017  . Influenza A 10/29/2017  . Acute respiratory failure ([place]) 09/24/2017  . Sepsis ([place]) 07/03/2017  . COPD (chronic obstructive pulmonary disease) ([place]) 04/26/2017  . Community acquired pneumonia 04/25/2017  . Chronic systolic heart failure ([place]) 02/03/2017  . Hypotension 02/03/2017  . Chest pain 12/09/2016  . RLS (restless legs syndrome) 12/09/2016  . Cough, persistent 11/22/2015  . Hypokalemia 11/22/2015  . Leukocytosis 11/22/2015  . Dyspnea 11/21/2015  . Respiratory distress 09/19/2015  . Breast pain 01/11/2013  . Hx MRSA infection   . Lump or mass in breast   . GOITER, MULTINODULAR 01/20/2009  . HYPOGLYCEMIA, UNSPECIFIED 01/20/2009  . GERD 01/20/2009  . DIVERTICULITIS OF COLON 01/20/2009    Past Surgical History:  Procedure Laterality Date  . ABDOMINAL HYSTERECTOMY     age 26  . BREAST BIOPSY Right 2014   benign  . CARDIAC DEFIBRILLATOR PLACEMENT    . CARDIAC DEFIBRILLATOR PLACEMENT    . CESAREAN SECTION    . CHOLECYSTECTOMY    . COLECTOMY    . COLONOSCOPY WITH PROPOFOL N/A 02/22/2018   Procedure: COLONOSCOPY WITH PROPOFOL;  Surgeon: Toledo, Benay Pike, MD;  Location: [facility] ENDOSCOPY;  Service: Gastroenterology;  Laterality: N/A;  . ESOPHAGOGASTRODUODENOSCOPY (EGD) WITH PROPOFOL N/A 02/22/2018   Procedure: ESOPHAGOGASTRODUODENOSCOPY (EGD) WITH PROPOFOL;  Surgeon: Toledo, Benay Pike, MD;  Location: [facility] ENDOSCOPY;  Service: Gastroenterology;  Laterality: N/A;  . NASAL SINUS SURGERY  2012  . OOPHORECTOMY      Prior  to Admission medications   Medication Sig Start Date End Date Taking? Authorizing Provider  albuterol (ACCUNEB) 1.25 MG/3ML nebulizer solution Take 3 mLs by nebulization every 6 (six) hours as needed for wheezing. 11/09/17   [provider]  albuterol (VENTOLIN HFA) 108 (90 Base) MCG/ACT inhaler Inhale 2 puffs into the lungs every 6 (six) hours as needed for shortness of breath. 08/28/16   [provider]  beclomethasone (QVAR) 80  MCG/ACT inhaler Inhale 1 puff into the lungs 2 (two) times daily. 06/12/17   [provider]  busPIRone (BUSPAR) 15 MG tablet Take 15 mg by mouth 2 (two) times daily as needed.  05/08/18   [provider]  DULoxetine (CYMBALTA) 30 MG capsule Take 30 mg by mouth daily. 10/27/19   [provider]  ENTRESTO 24-26 MG Take 0.5 tablets by mouth 2 (two) times daily. 01/08/20   [provider]  famotidine (PEPCID) 20 MG tablet Take 1 tablet (20 mg total) by mouth 2 (two) times daily. 03/15/19   Carrie Mew, MD  FARXIGA 5 MG TABS tablet Take 5 mg by mouth daily. 11/11/19   [provider]  fentaNYL (DURAGESIC) 50 MCG/HR Place 1 patch onto the skin every 3 (three) days. 10/08/18   Epifanio Lesches, MD  furosemide (LASIX) 40 MG tablet Take 80 mg by mouth 2 (two) times daily.  07/09/18   [provider]  hydrOXYzine (ATARAX/VISTARIL) 25 MG tablet Take 25 mg by mouth every 6 (six) hours as needed for itching. 04/13/19   [provider]  levothyroxine (SYNTHROID) 175 MCG tablet Take 175 mcg by mouth daily before breakfast.     [provider]  LORazepam (ATIVAN) 1 MG tablet Take 1 mg by mouth 4 (four) times daily as needed. 12/20/19   [provider]  losartan (COZAAR) 25 MG tablet Take 25 mg by mouth daily.     [provider]  metoCLOPramide (REGLAN) 10 MG tablet Take 1 tablet (10 mg total) by mouth every 8 (eight) hours as needed for up to 3 days for nausea. 02/11/20 02/14/20  Rudene Re, MD  metoprolol succinate (TOPROL-XL) 50 MG 24 hr tablet Take 50 mg by mouth 2 (two) times daily. 01/25/19   [provider]  mirtazapine (REMERON) 15 MG tablet Take 15 mg by mouth at bedtime. 04/26/19 05/26/24  [provider]  morphine (MSIR) 15 MG tablet Take 15 mg by mouth as needed for severe pain.     [provider]  Multiple Vitamins-Minerals (ONE-A-DAY WOMENS PO) Take 1 tablet by mouth daily.    [provider]  nitroGLYCERIN (NITROSTAT) 0.4 MG SL tablet Place 1 tablet (0.4 mg total) under the tongue every 5 (five) minutes as needed for chest pain. 01/24/17   Henreitta Leber, MD  omeprazole (PRILOSEC) 40 MG capsule Take 40 mg by mouth 2 (two) times daily. 10/19/18   [provider]  oxyCODONE (OXY IR/ROXICODONE) 5 MG immediate release tablet Take 5-10 mg by mouth every 4 (four) hours as needed for pain. 04/11/19   [provider]  oxyCODONE-acetaminophen (PERCOCET) 5-325 MG tablet Take 1 tablet by mouth every 4 (four) hours as needed. 02/11/20   Rudene Re, MD  oxyCODONE-acetaminophen (PERCOCET/ROXICET) 5-325 MG tablet Take 1 tablet by mouth every 4 (four) hours as needed.  01/17/20   [provider]  Pancrelipase, Lip-Prot-Amyl, (CREON) 24000-76000 units CPEP Take 3-4 capsules by mouth 3 (three) times daily with meals. 04/01/19   [provider]  pantoprazole (  PROTONIX) 40 MG tablet Take 40 mg by mouth 2 (two) times daily. 12/20/19   [provider]  potassium chloride SA (KLOR-CON M20) 20 MEQ tablet Take 1 tablet (20 mEq total) by mouth 2 (two) times daily for 7 days. 07/09/19 01/23/20  Carrie Mew, MD  promethazine (PHENERGAN) 25 MG tablet Take 1 tablet (25 mg total) by mouth every 6 (six) hours as needed. 07/09/19   Carrie Mew, MD  rOPINIRole (REQUIP) 4 MG tablet Take 4 mg by mouth at bedtime. 01/30/19   [provider]  spironolactone (ALDACTONE) 50 MG tablet Take 50 mg by mouth daily. 11/26/18   [provider]  sucralfate (CARAFATE) 1 g tablet Take 1 tablet (1 g total) by mouth 4 (four) times daily. 03/15/19   Carrie Mew, MD  vitamin B-12 (CYANOCOBALAMIN) 1000 MCG tablet Take 1,000 mcg by mouth daily.    [provider]    Allergies Contrast media [iodinated diagnostic agents], Lidocaine, Metrizamide, Penicillins, Isosorbide nitrate, Latex, Ondansetron, Zofran [ondansetron hcl], Hydromorphone,  Povidone-iodine, and Pulmicort [budesonide]  Family History  Problem Relation Age of Onset  . Cancer Mother 30       ovarian  . CAD Mother   . Cancer Father 15       brain  . CAD Father   . Cancer Daughter 18       skin  . Cancer Maternal Aunt 46       breast  . Leukemia Paternal Grandfather     Social History Social History   Tobacco Use  . Smoking status: Never Smoker  . Smokeless tobacco: Never Used  Substance Use Topics  . Alcohol use: No  . Drug use: No    Review of Systems  Constitutional: No fever/chills Eyes: No visual changes. ENT: No sore throat. Cardiovascular: Denies chest pain. Respiratory: Denies shortness of breath. Gastrointestinal: No abdominal pain.  No nausea, no vomiting.  No diarrhea.  No constipation. Genitourinary: Negative for dysuria. Musculoskeletal: Negative for back pain. Skin: Negative for rash. Neurological: Negative for headaches, focal weakness   ____________________________________________   PHYSICAL EXAM:  VITAL SIGNS: ED Triage Vitals  Enc Vitals Group     BP 02/12/20 0406 116/85     Pulse Rate 02/12/20 0406 93     Resp 02/12/20 0406 18     Temp 02/12/20 0406 97.9 F (36.6 C)     Temp Source 02/12/20 0406 Oral     SpO2 02/12/20 0406 97 %     Weight 02/12/20 0409 201 lb 15.1 oz (91.6 kg)     Height 02/12/20 0409 5\' 5"  (1.651 m)     Head Circumference --      Peak Flow --      Pain Score 02/12/20 0406 10     Pain Loc --      Pain Edu? --      Excl. in [place]? --     Constitutional: Alert and oriented. Well appearing and in no acute distress. Eyes: Conjunctivae are normal. PER Head: Atraumatic. Nose: No congestion/rhinnorhea. Mouth/Throat: Mucous membranes are moist.  Oropharynx non-erythematous. Neck: No stridor.  Cardiovascular: Normal rate, regular rhythm. Grossly normal heart sounds.  Good peripheral circulation. Respiratory: Normal respiratory effort.  No retractions. Lungs CTAB. Gastrointestinal: Soft and  nontender. No distention. No abdominal bruits. No CVA tenderness. Musculoskeletal: No lower extremity tenderness nor edema.   Neurologic:  Normal speech and language. No gross focal neurologic deficits are appreciated.  Skin:  Skin is warm, dry and intact. No  rash noted.  ____________________________________________   LABS (all labs ordered are listed, but only abnormal results are displayed)  Labs Reviewed  SARS CORONAVIRUS 2 BY RT PCR (HOSPITAL ORDER, [place] [facility])  INFLUENZA PANEL BY PCR (TYPE A & B)  COMPREHENSIVE METABOLIC PANEL  CBC WITH DIFFERENTIAL/PLATELET  URINALYSIS, COMPLETE (UACMP) WITH MICROSCOPIC  SEDIMENTATION RATE   ____________________________________________  EKG   ____________________________________________  RADIOLOGY  ED MD interpretation:   Official radiology report(s): No results found.  ____________________________________________   PROCEDURES  Procedure(s) performed (including Critical Care):  Procedures   ____________________________________________   INITIAL IMPRESSION / ASSESSMENT AND PLAN / ED COURSE  Patient appears to have left after I saw her but before she got any of the test done.  She had a negative Covid and flu test yesterday.  Chest x-ray was done was indeterminant for pneumonia but her procalcitonin was negative.  She did not look ill today and as I said she has left.             ____________________________________________   FINAL CLINICAL IMPRESSION(S) / ED DIAGNOSES  Final diagnoses:  Body aches     ED Discharge Orders    None       Note:  This document was prepared using Dragon voice recognition software and may include unintentional dictation errors.    Nena Polio, MD 02/12/20 1043

## 2020-02-13 LAB — LEVETIRACETAM LEVEL: Levetiracetam Lvl: 28.3 ug/mL (ref 10.0–40.0)

## 2020-02-22 IMAGING — DX PORTABLE CHEST - 1 VIEW
1 series · 1 of 1 positions shown · non-contrast
Comparison: Prior radiograph from 07/23/2018.

CLINICAL DATA: Initial evaluation for acute chest pain.

EXAM:
PORTABLE CHEST 1 VIEW

[chest ap]
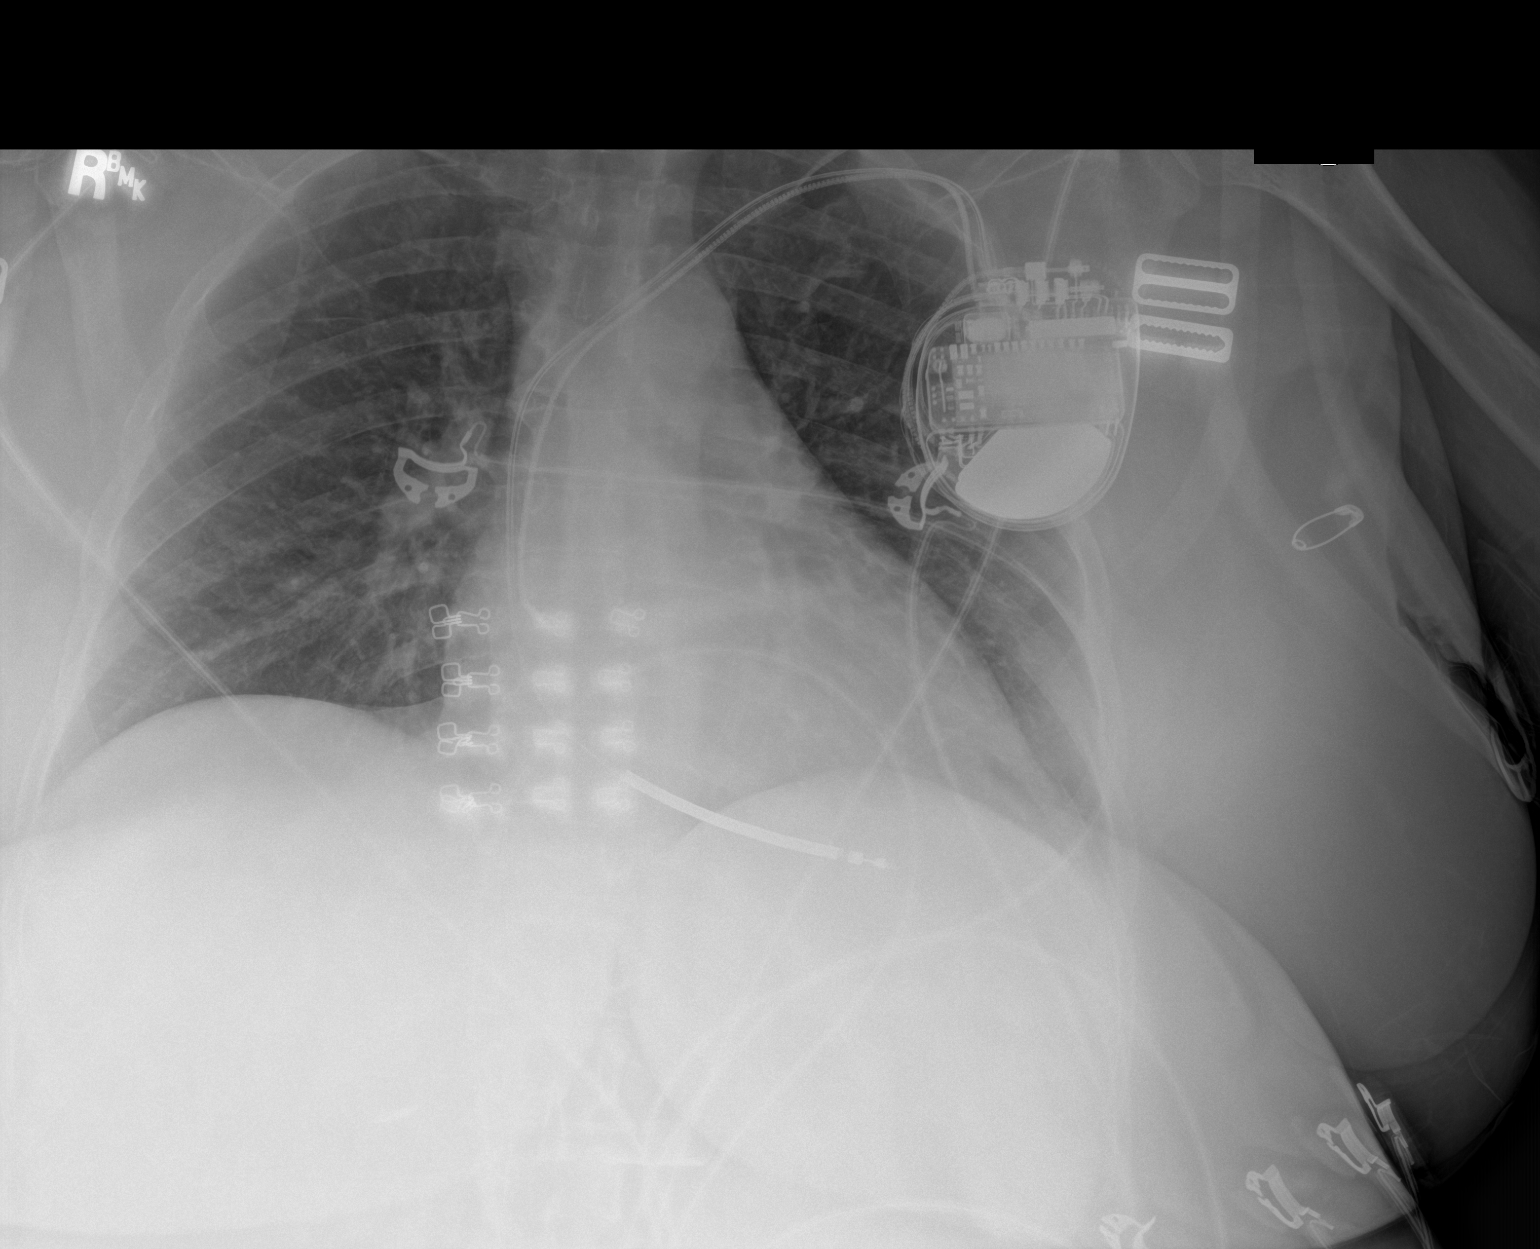

[1 of 1 positions shown; findings below may reference images not displayed]

FINDINGS: Left-sided pacemaker/AICD in place. Mild cardiomegaly, stable.
Mediastinal silhouette normal.

Lungs normally inflated. No focal infiltrates. No edema or effusion.
No pneumothorax.

No acute osseous finding.
IMPRESSION: 1. No active cardiopulmonary disease.
2. Mild cardiomegaly with left-sided pacemaker/AICD in place.

## 2020-03-09 IMAGING — DX DG ABDOMEN ACUTE W/ 1V CHEST
4 series · 4 of 4 positions shown · non-contrast
Comparison: None.

CLINICAL DATA: Abdominal pain, nausea, vomiting, diarrhea for 3
days

EXAM:
DG ABDOMEN ACUTE W/ 1V CHEST

[chest ap]
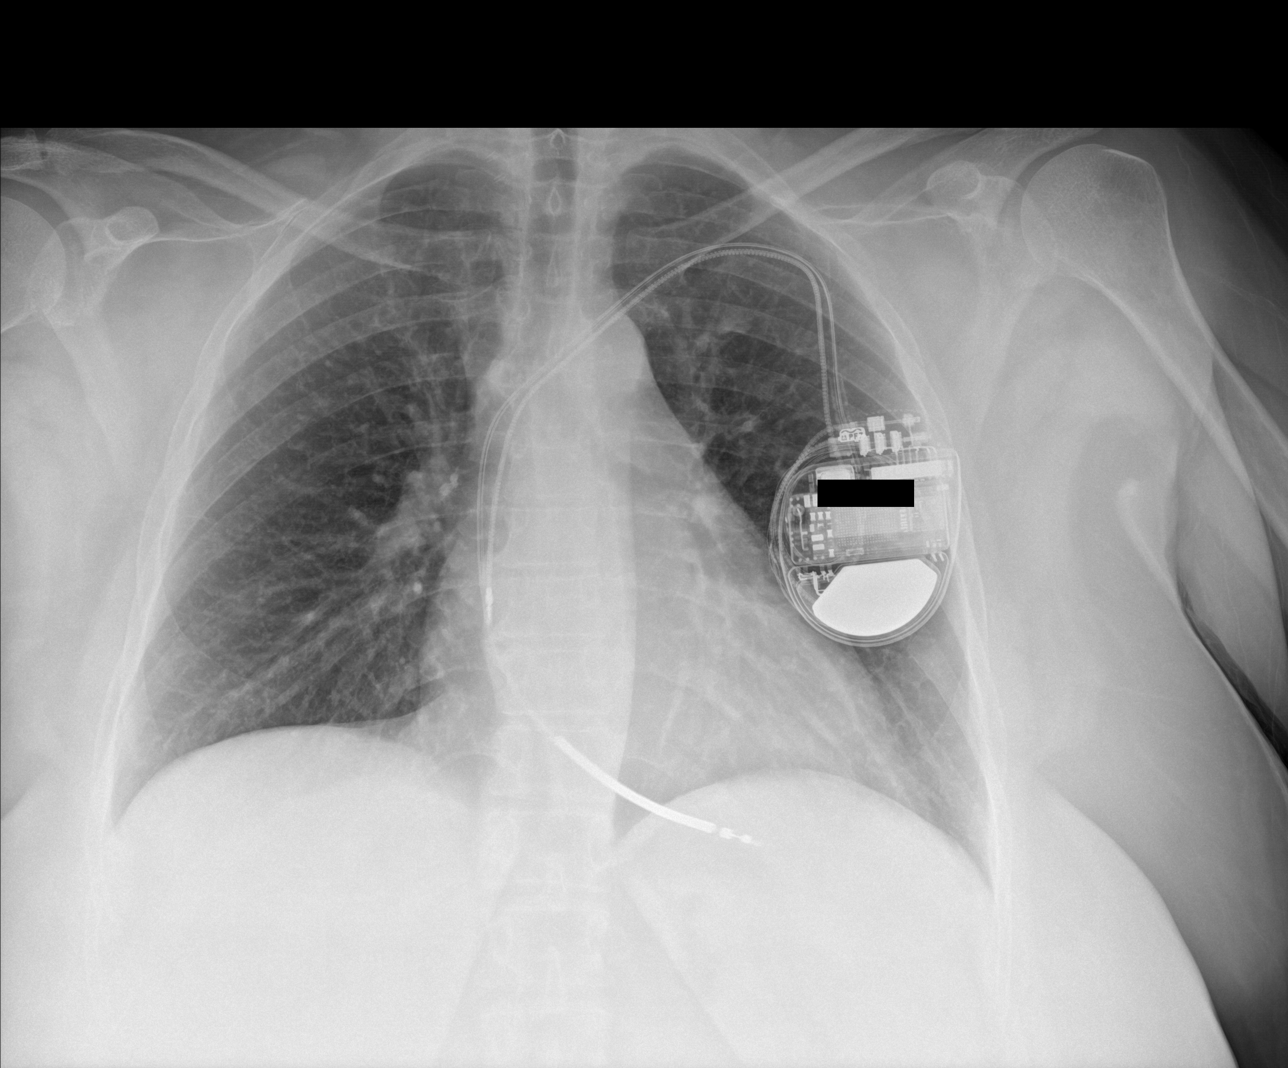

[abdomen erect]
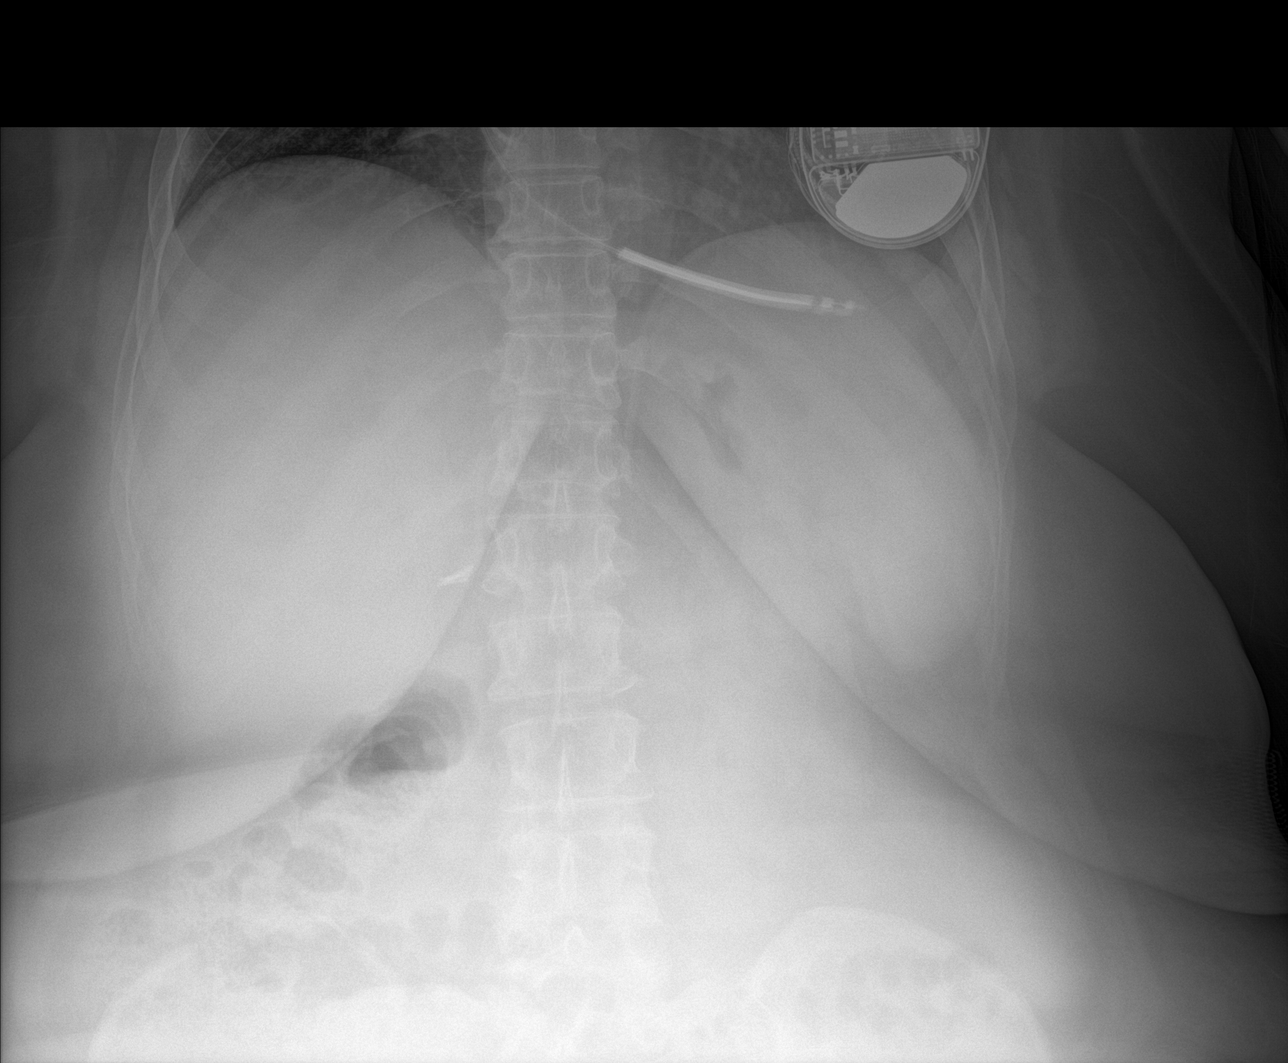

[abdomen supine (1 of 2)]
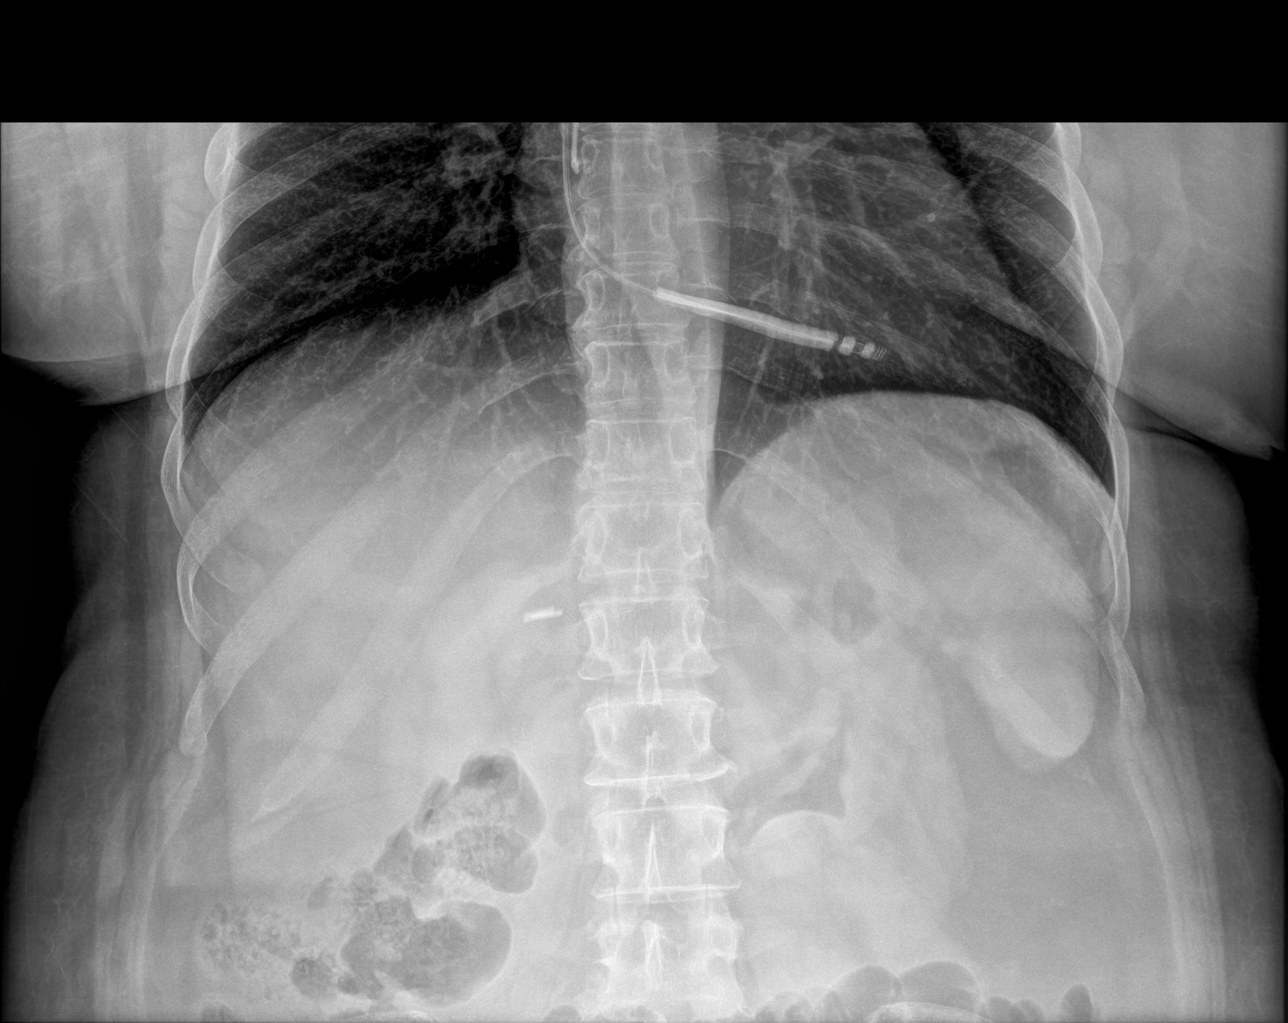

[abdomen supine (2 of 2)]
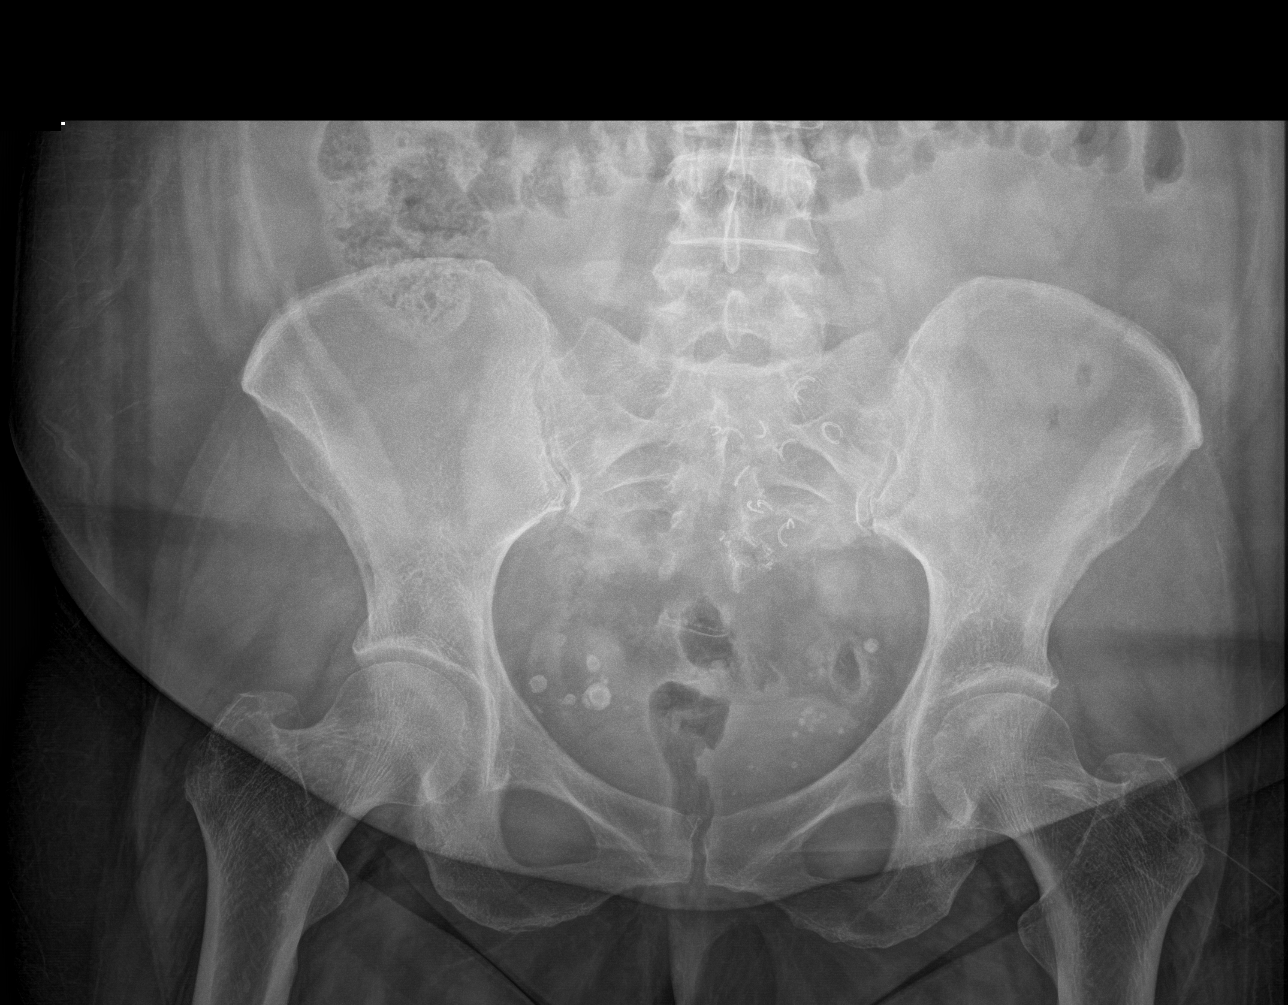

[4 of 4 positions shown; findings below may reference images not displayed]

FINDINGS: There is no evidence of dilated bowel loops or free intraperitoneal
air. No radiopaque calculi or other significant radiographic
abnormality is seen. Heart size and mediastinal contours are within
normal limits. Dual lead cardiac pacemaker. Both lungs are clear.
IMPRESSION: Negative abdominal radiographs.  No acute cardiopulmonary disease.

## 2020-03-12 ENCOUNTER — Encounter: Payer: Self-pay | Admitting: Emergency Medicine

## 2020-03-12 ENCOUNTER — Emergency Department
Admission: EM | Admit: 2020-03-12 | Discharge: 2020-03-12 | Disposition: A | Discharge status: Home / Self Care | Payer: Medicare Other | Attending: Emergency Medicine | Admitting: Emergency Medicine

## 2020-03-12 ENCOUNTER — Other Ambulatory Visit: Payer: Self-pay

## 2020-03-12 DIAGNOSIS — Z5321 Procedure and treatment not carried out due to patient leaving prior to being seen by health care provider: Secondary | ICD-10-CM | POA: Insufficient documentation

## 2020-03-12 DIAGNOSIS — R319 Hematuria, unspecified: Secondary | ICD-10-CM | POA: Insufficient documentation

## 2020-03-12 DIAGNOSIS — R300 Dysuria: Secondary | ICD-10-CM | POA: Insufficient documentation

## 2020-03-12 DIAGNOSIS — Z85028 Personal history of other malignant neoplasm of stomach: Secondary | ICD-10-CM | POA: Diagnosis not present

## 2020-03-12 DIAGNOSIS — R509 Fever, unspecified: Secondary | ICD-10-CM | POA: Diagnosis not present

## 2020-03-12 LAB — BASIC METABOLIC PANEL
Anion gap: 10 (ref 5–15)
BUN: 11 mg/dL (ref 6–20)
CO2: 27 mmol/L (ref 22–32)
Calcium: 8.5 mg/dL — ABNORMAL LOW (ref 8.9–10.3)
Chloride: 99 mmol/L (ref 98–111)
Creatinine, Ser: 0.83 mg/dL (ref 0.44–1.00)
GFR calc Af Amer: >60 mL/min (ref >60)
GFR calc non Af Amer: >60 mL/min (ref >60)
Glucose, Bld: 154 mg/dL — ABNORMAL HIGH (ref 70–99)
Potassium: 3.4 mmol/L — ABNORMAL LOW (ref 3.5–5.1)
Sodium: 136 mmol/L (ref 135–145)

## 2020-03-12 LAB — URINALYSIS, COMPLETE (UACMP) WITH MICROSCOPIC
Bilirubin Urine: NEGATIVE
Glucose, UA: NEGATIVE mg/dL
Hgb urine dipstick: NEGATIVE
Ketones, ur: NEGATIVE mg/dL
Leukocytes,Ua: NEGATIVE
Nitrite: POSITIVE — AB
Protein, ur: NEGATIVE mg/dL
Specific Gravity, Urine: 1.02 (ref 1.005–1.030)
pH: 5 (ref 5.0–8.0)

## 2020-03-12 LAB — CBC
HCT: 36.7 % (ref 36.0–46.0)
Hemoglobin: 13.5 g/dL (ref 12.0–15.0)
MCH: 31 pg (ref 26.0–34.0)
MCHC: 36.8 g/dL — ABNORMAL HIGH (ref 30.0–36.0)
MCV: 84.2 fL (ref 80.0–100.0)
Platelets: 287 10*3/uL (ref 150–400)
RBC: 4.36 MIL/uL (ref 3.87–5.11)
RDW: 13.3 % (ref 11.5–15.5)
WBC: 11.1 10*3/uL — ABNORMAL HIGH (ref 4.0–10.5)
nRBC: 0 % (ref 0.0–0.2)

## 2020-03-12 NOTE — ED Notes (Signed)
Called pt from lobby to be taken back to exam room. Unable to locate pt at this time.

## 2020-03-12 NOTE — ED Notes (Signed)
Attempted to call pt from lobby to be taken to exam room. Unable to locate pt at this time.

## 2020-03-12 NOTE — ED Triage Notes (Addendum)
Patient presents to the ED with dysuria and hematuria since Saturday.  Patient states she has tried OTC medication for UTIs with no relief.  Patient is also currently being treated for stomach cancer but is not going through chemo or radiation at this time.  Patient states she had a fever of 100.4 at home prior to arrival and took tylenol approx. 1.5 hours ago.

## 2020-03-12 NOTE — ED Notes (Signed)
Attempted to locate pt to be taken back to exam room. Unable to locate pt at this time.

## 2020-03-13 ENCOUNTER — Telehealth: Payer: Self-pay | Admitting: Emergency Medicine

## 2020-03-13 NOTE — Telephone Encounter (Signed)
Called patient due to lwot to inquire about condition and follow up plans. Left message.   

## 2020-03-14 LAB — URINE CULTURE: Culture: <10000 — AB

## 2020-03-25 ENCOUNTER — Emergency Department
Admission: EM | Admit: 2020-03-25 | Discharge: 2020-03-25 | Discharge status: Against Medical Advice | Payer: Medicare Other

## 2020-03-25 NOTE — ED Notes (Signed)
Pt called in the WR with no response 

## 2020-03-30 ENCOUNTER — Emergency Department
Admission: EM | Admit: 2020-03-30 | Discharge: 2020-03-31 | Disposition: A | Discharge status: Home / Self Care | Payer: Medicare Other | Attending: Emergency Medicine | Admitting: Emergency Medicine

## 2020-03-30 ENCOUNTER — Other Ambulatory Visit: Payer: Self-pay

## 2020-03-30 ENCOUNTER — Encounter: Payer: Self-pay | Admitting: *Deleted

## 2020-03-30 ENCOUNTER — Emergency Department: Payer: Medicare Other

## 2020-03-30 DIAGNOSIS — Z91040 Latex allergy status: Secondary | ICD-10-CM | POA: Insufficient documentation

## 2020-03-30 DIAGNOSIS — Z95810 Presence of automatic (implantable) cardiac defibrillator: Secondary | ICD-10-CM | POA: Diagnosis not present

## 2020-03-30 DIAGNOSIS — J45909 Unspecified asthma, uncomplicated: Secondary | ICD-10-CM | POA: Insufficient documentation

## 2020-03-30 DIAGNOSIS — J449 Chronic obstructive pulmonary disease, unspecified: Secondary | ICD-10-CM | POA: Insufficient documentation

## 2020-03-30 DIAGNOSIS — Z85028 Personal history of other malignant neoplasm of stomach: Secondary | ICD-10-CM | POA: Insufficient documentation

## 2020-03-30 DIAGNOSIS — R079 Chest pain, unspecified: Secondary | ICD-10-CM | POA: Insufficient documentation

## 2020-03-30 DIAGNOSIS — I509 Heart failure, unspecified: Secondary | ICD-10-CM | POA: Diagnosis not present

## 2020-03-30 LAB — CBC
HCT: 37.1 % (ref 36.0–46.0)
Hemoglobin: 12.8 g/dL (ref 12.0–15.0)
MCH: 30.4 pg (ref 26.0–34.0)
MCHC: 34.5 g/dL (ref 30.0–36.0)
MCV: 88.1 fL (ref 80.0–100.0)
Platelets: 274 10*3/uL (ref 150–400)
RBC: 4.21 MIL/uL (ref 3.87–5.11)
RDW: 12.9 % (ref 11.5–15.5)
WBC: 8.4 10*3/uL (ref 4.0–10.5)
nRBC: 0 % (ref 0.0–0.2)

## 2020-03-30 LAB — BASIC METABOLIC PANEL
Anion gap: 9 (ref 5–15)
BUN: 11 mg/dL (ref 6–20)
CO2: 31 mmol/L (ref 22–32)
Calcium: 8.3 mg/dL — ABNORMAL LOW (ref 8.9–10.3)
Chloride: 100 mmol/L (ref 98–111)
Creatinine, Ser: 0.99 mg/dL (ref 0.44–1.00)
GFR calc Af Amer: >60 mL/min (ref >60)
GFR calc non Af Amer: >60 mL/min (ref >60)
Glucose, Bld: 109 mg/dL — ABNORMAL HIGH (ref 70–99)
Potassium: 3 mmol/L — ABNORMAL LOW (ref 3.5–5.1)
Sodium: 140 mmol/L (ref 135–145)

## 2020-03-30 LAB — TROPONIN I (HIGH SENSITIVITY)
Troponin I (High Sensitivity): 3 ng/L (ref <18)
Troponin I (High Sensitivity): 4 ng/L (ref <18)

## 2020-03-30 MED ORDER — SODIUM CHLORIDE 0.9% FLUSH: 3.0000 mL | Freq: Once | INTRAVENOUS | Status: DC

## 2020-03-30 MED ORDER — MORPHINE SULFATE (PF) 4 MG/ML IV SOLN
4.0000 mg | Freq: Once | INTRAVENOUS | Status: AC
  Administered 2020-03-30: 4 mg via INTRAVENOUS
  Filled 2020-03-30: qty 1

## 2020-03-30 MED ORDER — SODIUM CHLORIDE 0.9 % IV SOLN: Freq: Once | INTRAVENOUS | Status: AC

## 2020-03-30 NOTE — ED Notes (Signed)
Med given  Pt to xray.

## 2020-03-30 NOTE — ED Provider Notes (Signed)
ER Provider Note       Time seen: 7:58 PM    I have reviewed the vital signs and the nursing notes.  HISTORY   Chief Complaint Chest Pain   HPI Brittany Nguyen is a 54 y.o. female with a history of asthma, CHF, COPD, DVT, endometriosis, stomach cancer, carcinoid tumor who presents today for chest pain.  Patient took 2 nitroglycerin at home, EMS gave 324 mg of aspirin.  She reports pain that radiated into her neck and jaw.  She denies fevers, chills or other complaints.  Past Medical History:  Diagnosis Date  . Asthma 2013  . 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 CHF (congestive heart failure) ([place])   . COPD (chronic obstructive pulmonary disease) ([place])   . Diverticulitis 2010  . DVT (deep venous thrombosis) ([place])   . Endometriosis 1990  . Heart disease 2013  . Hx MRSA infection   . Iron deficiency   . Lump or mass in breast   . Restless leg   . Stomach cancer [facility])     Past Surgical History:  Procedure Laterality Date  . ABDOMINAL HYSTERECTOMY     age 63  . BREAST BIOPSY Right 2014   benign  . CARDIAC DEFIBRILLATOR PLACEMENT    . CARDIAC DEFIBRILLATOR PLACEMENT    . CESAREAN SECTION    . CHOLECYSTECTOMY    . COLECTOMY    . COLONOSCOPY WITH PROPOFOL N/A 02/22/2018   Procedure: COLONOSCOPY WITH PROPOFOL;  Surgeon: Toledo, Benay Pike, MD;  Location: [facility] ENDOSCOPY;  Service: Gastroenterology;  Laterality: N/A;  . ESOPHAGOGASTRODUODENOSCOPY (EGD) WITH PROPOFOL N/A 02/22/2018   Procedure: ESOPHAGOGASTRODUODENOSCOPY (EGD) WITH PROPOFOL;  Surgeon: Toledo, Benay Pike, MD;  Location: [facility] ENDOSCOPY;  Service: Gastroenterology;  Laterality: N/A;  . NASAL SINUS SURGERY  2012  . OOPHORECTOMY      Allergies Contrast media [iodinated diagnostic agents], Lidocaine, Metrizamide, Penicillins, Isosorbide nitrate, Latex, Ondansetron, Zofran [ondansetron hcl], Hydromorphone, Povidone-iodine, and Pulmicort [budesonide]  Review of Systems Constitutional: Negative  for fever. Cardiovascular: Positive for chest pain Respiratory: Negative for shortness of breath. Gastrointestinal: Negative for abdominal pain, vomiting and diarrhea. Musculoskeletal: Negative for back pain. Skin: Negative for rash. Neurological: Negative for headaches, focal weakness or numbness.  All systems negative/normal/unremarkable except as stated in the HPI  ____________________________________________   PHYSICAL EXAM:  VITAL SIGNS: Vitals:   03/30/20 2138 03/30/20 2139  BP:    Pulse: 87 84  Resp:    Temp:    SpO2: 100% 100%    Constitutional: Alert and oriented. Well appearing and in no distress. Eyes: Conjunctivae are normal. Normal extraocular movements. ENT      Head: Normocephalic and atraumatic.      Nose: No congestion/rhinnorhea.      Mouth/Throat: Mucous membranes are moist.      Neck: No stridor. Cardiovascular: Normal rate, regular rhythm. No murmurs, rubs, or gallops. Respiratory: Normal respiratory effort without tachypnea nor retractions. Breath sounds are clear and equal bilaterally. No wheezes/rales/rhonchi. Gastrointestinal: Soft and nontender. Normal bowel sounds Musculoskeletal: Nontender with normal range of motion in extremities. No lower extremity tenderness nor edema. Neurologic:  Normal speech and language. No gross focal neurologic deficits are appreciated.  Skin:  Skin is warm, dry and intact. No rash noted. Psychiatric: Speech and behavior are normal.  ____________________________________________  EKG: Interpreted by me.  Sinus rhythm with a rate of 92 bpm, low voltage, long QT  ____________________________________________   LABS (pertinent positives/negatives)  Labs Reviewed  BASIC METABOLIC  PANEL - Abnormal; Notable for the following components:      Result Value   Potassium 3.0 (*)    Glucose, Bld 109 (*)    Calcium 8.3 (*)    All other components within normal limits  CBC  TROPONIN I (HIGH SENSITIVITY)     RADIOLOGY  Images were viewed by me Chest x-ray IMPRESSION: No active cardiopulmonary disease.  DIFFERENTIAL DIAGNOSIS  Musculoskeletal pain, GERD, anxiety, unstable angina, MI, PE  ASSESSMENT AND PLAN  Chest pain   Plan: The patient had presented for nonspecific chest pain. Patient's labs were unremarkable.  She is cleared for outpatient follow-up.  Lenise Arena MD    Note: This note was generated in part or whole with voice recognition software. Voice recognition is usually quite accurate but there are transcription errors that can and very often do occur. I apologize for any typographical errors that were not detected and corrected.     Earleen Newport, MD 03/30/20 [phone]

## 2020-03-30 NOTE — ED Notes (Signed)
Pt brought in via ems from home with chest pain.  Sx began at 1800 tonight.  States it hurts to take a deep breath.  Pt took 2 ntg at home.  Ems gave 324 mg asa en route.  Pt reports near syncopal episode.  Iv in place.  Labs sent.  Pt has a pacemaker.  md at bedside.  Pt alert  Speech clear.

## 2020-03-30 NOTE — Discharge Instructions (Addendum)
Return to the ER for worsening symptoms, persistent vomiting, difficulty breathing or other concerns. °

## 2020-03-30 NOTE — ED Triage Notes (Signed)
Pt brought in via ems from home with chest pain.  Pt took 2 ntg at home.  Ems gave 324 mg asa en route.   Pt has a pacemaker Iv in place   Pt alert.

## 2020-03-30 NOTE — ED Notes (Signed)
Pt hypotensive.  md aware.  Pt alert  Sinus on monitor.  Iv fluids infusing.  Dr Jimmye Norman in with pt.

## 2020-03-31 NOTE — ED Notes (Signed)
Pt alert  No chest pain .  nsr on monitor.

## 2020-03-31 NOTE — ED Provider Notes (Signed)
-----------------------------------------   12:28 AM on 03/31/2020 -----------------------------------------  Repeat troponin is unremarkable.  Strict return precautions given.  Patient verbalizes understanding agrees with plan of care.   Paulette Blanch, MD 03/31/20 [phone]

## 2020-04-02 IMAGING — CR CHEST - 2 VIEW
2 series · 2 of 2 positions shown · non-contrast
Comparison: Single-view of the chest 12/23/2018 and 09/24/2017.

CLINICAL DATA: Nausea and chest pain today.

EXAM:
CHEST - 2 VIEW

[chest pa]
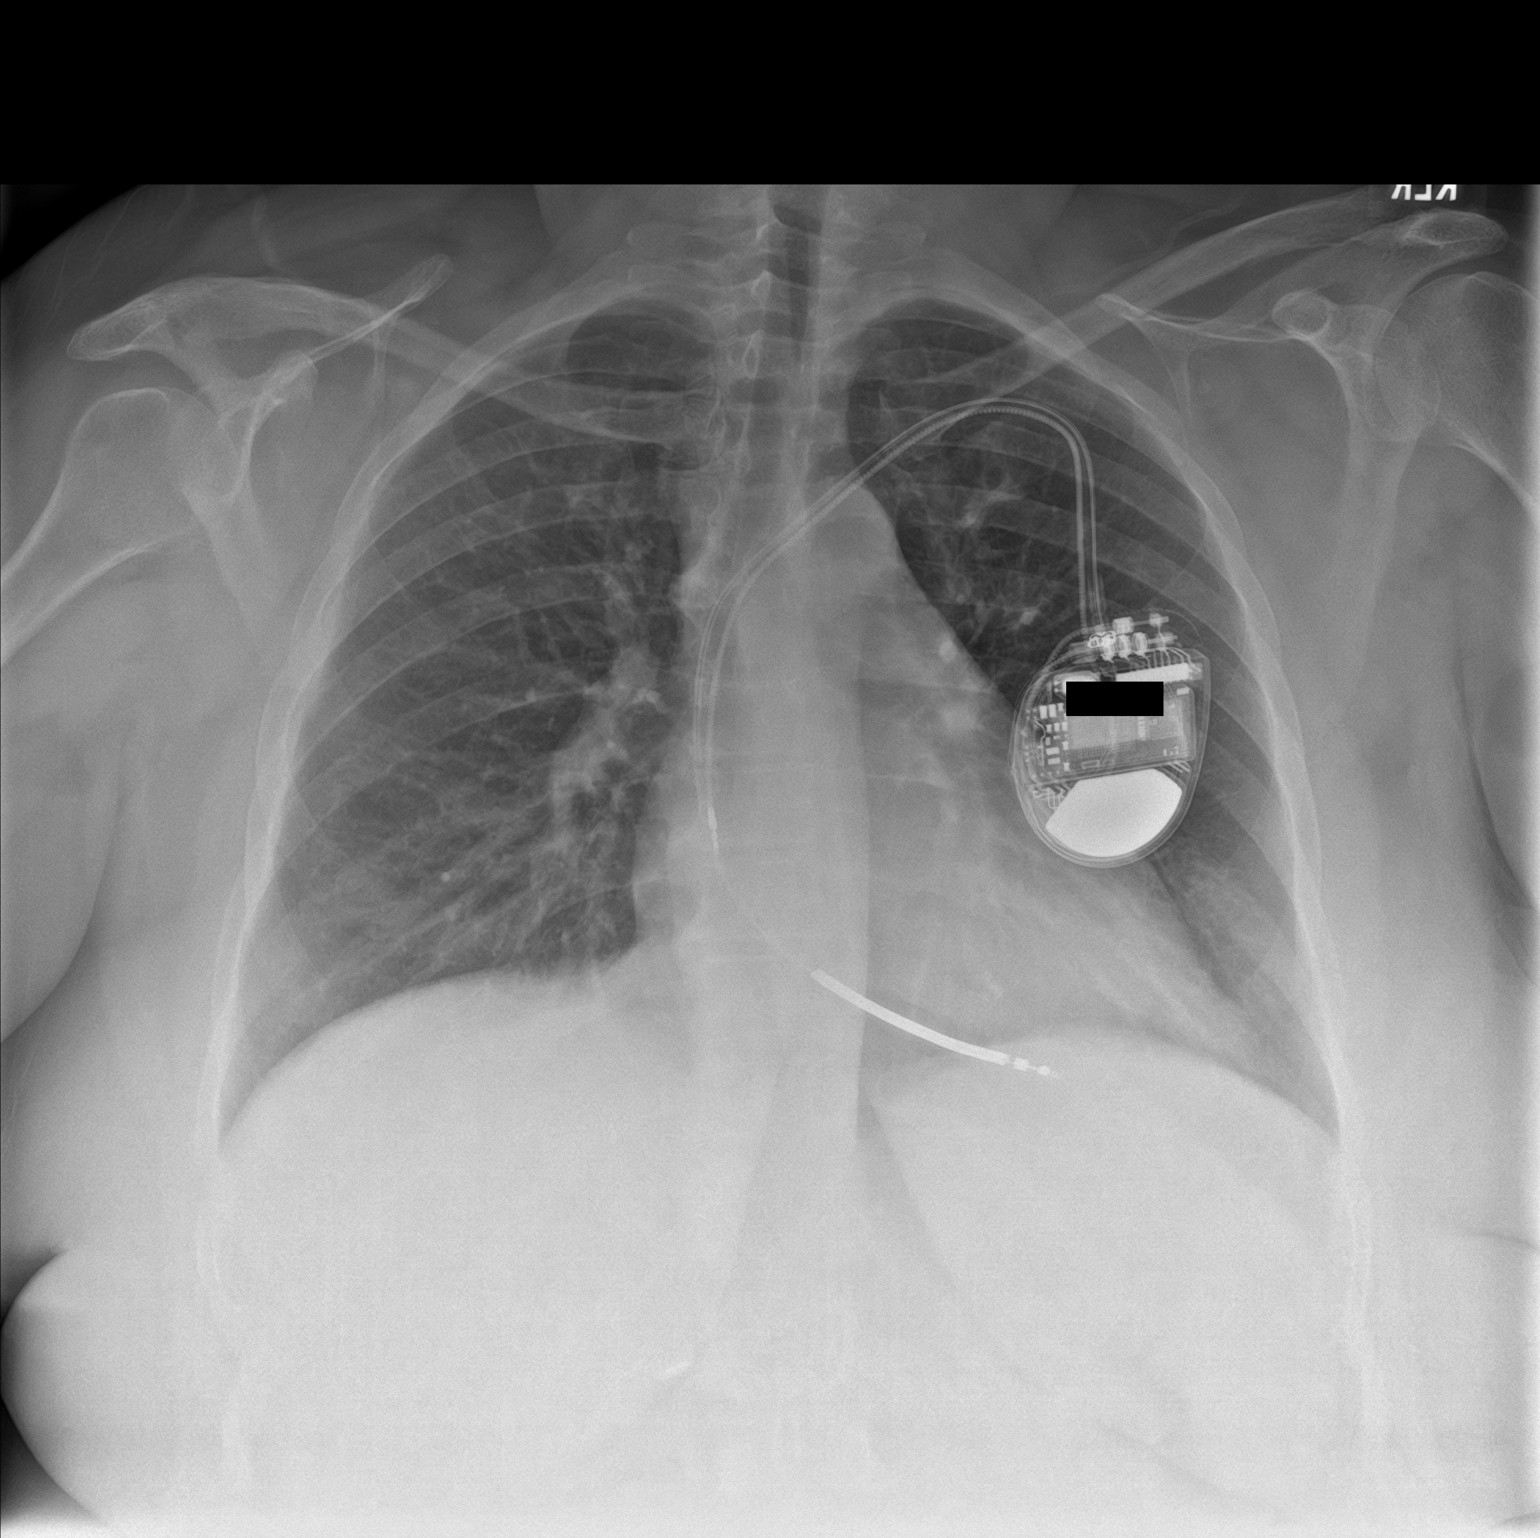

[chest lat]
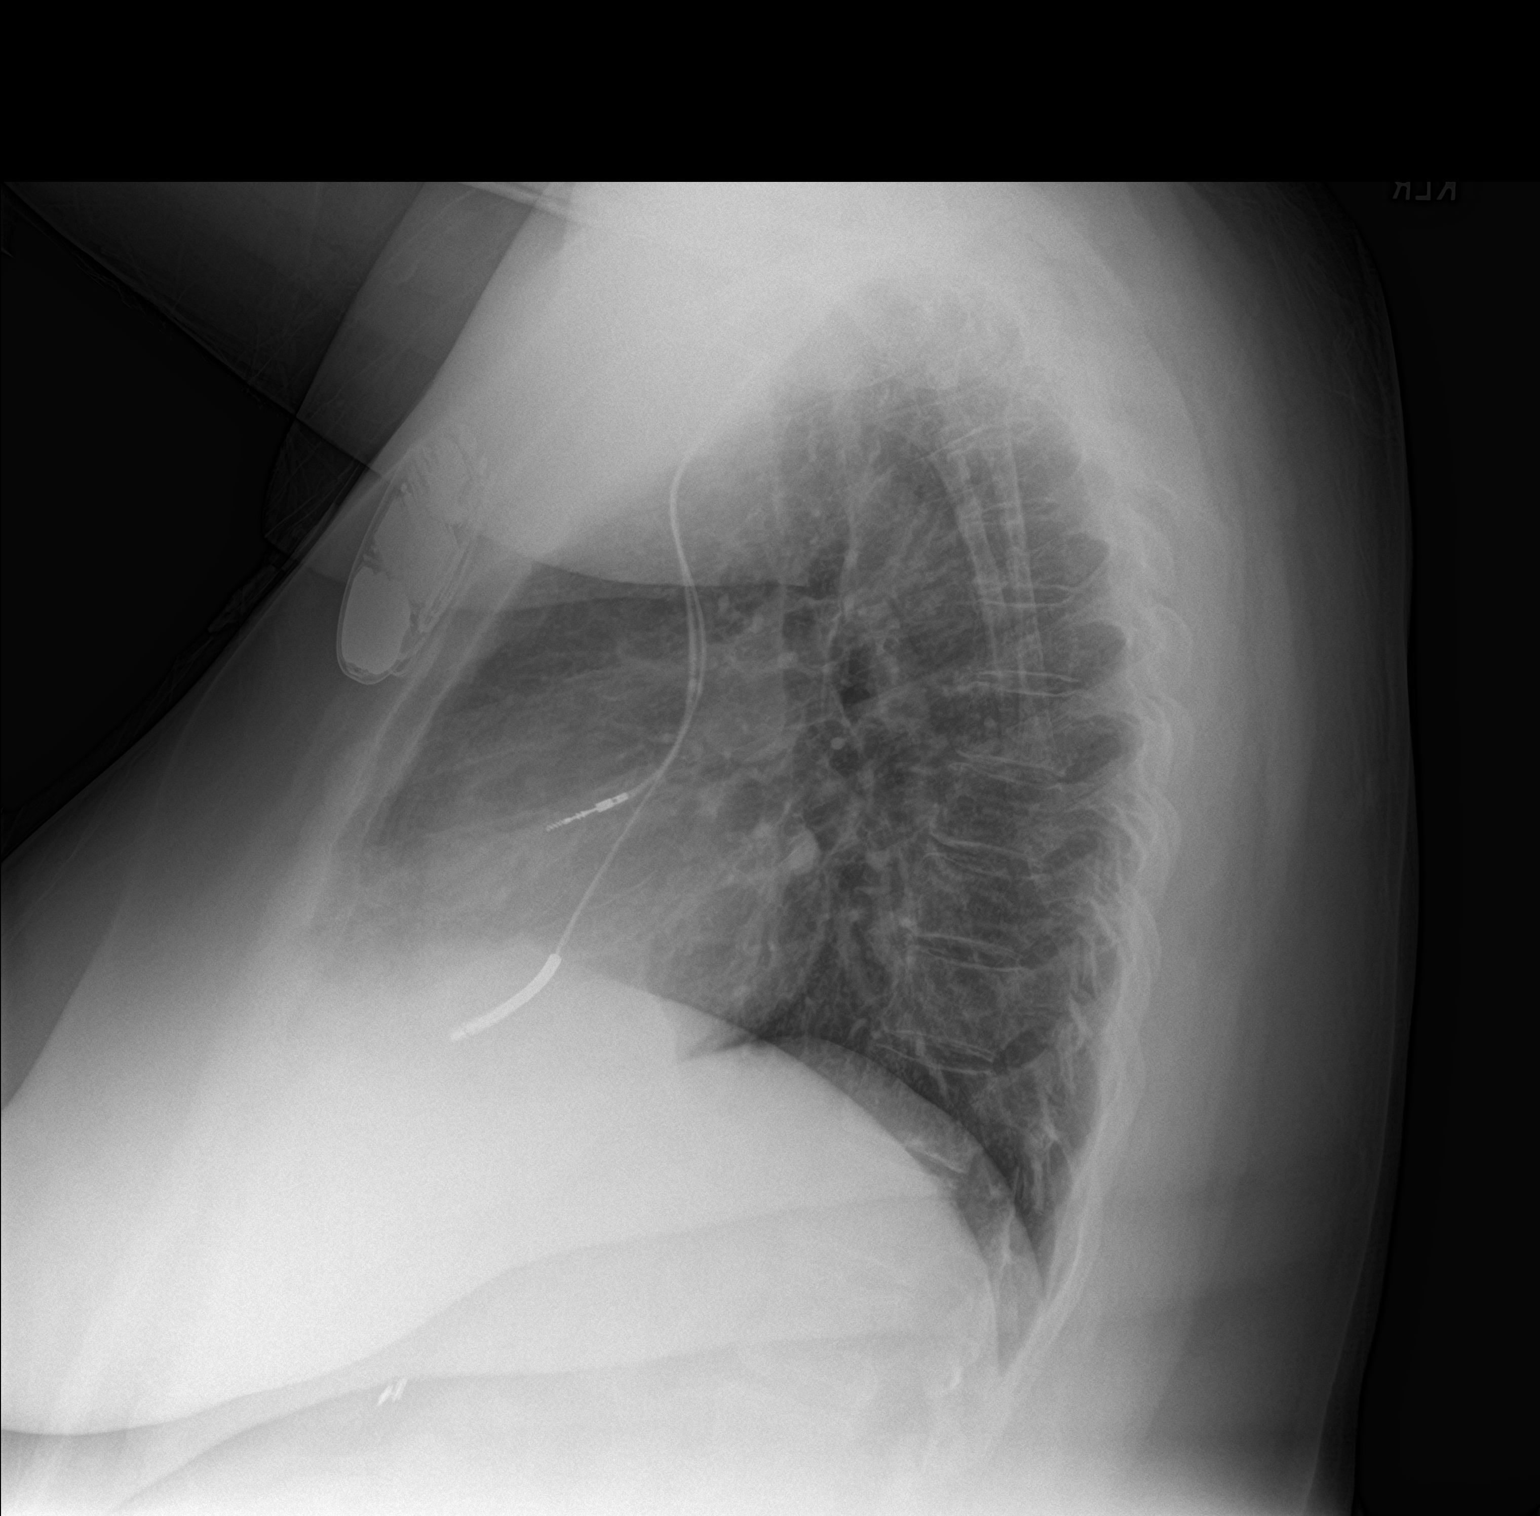

[2 of 2 positions shown; findings below may reference images not displayed]

FINDINGS: Lungs are clear. Heart size is normal. Pacing device is unchanged.
No pneumothorax or pleural fluid. No acute or focal bony
abnormality.
IMPRESSION: No acute disease.

## 2020-04-28 ENCOUNTER — Other Ambulatory Visit: Payer: Self-pay

## 2020-04-28 ENCOUNTER — Emergency Department
Admission: EM | Admit: 2020-04-28 | Discharge: 2020-04-28 | Disposition: A | Discharge status: Home / Self Care | Payer: Medicare Other | Attending: Emergency Medicine | Admitting: Emergency Medicine

## 2020-04-28 DIAGNOSIS — R569 Unspecified convulsions: Secondary | ICD-10-CM | POA: Diagnosis not present

## 2020-04-28 DIAGNOSIS — Z5321 Procedure and treatment not carried out due to patient leaving prior to being seen by health care provider: Secondary | ICD-10-CM | POA: Insufficient documentation

## 2020-04-28 HISTORY — DX: Unspecified convulsions: R56.9

## 2020-04-28 LAB — BASIC METABOLIC PANEL
Anion gap: 14 (ref 5–15)
BUN: 13 mg/dL (ref 6–20)
CO2: 27 mmol/L (ref 22–32)
Calcium: 8.7 mg/dL — ABNORMAL LOW (ref 8.9–10.3)
Chloride: 95 mmol/L — ABNORMAL LOW (ref 98–111)
Creatinine, Ser: 0.95 mg/dL (ref 0.44–1.00)
GFR calc Af Amer: >60 mL/min (ref >60)
GFR calc non Af Amer: >60 mL/min (ref >60)
Glucose, Bld: 105 mg/dL — ABNORMAL HIGH (ref 70–99)
Potassium: 3.2 mmol/L — ABNORMAL LOW (ref 3.5–5.1)
Sodium: 136 mmol/L (ref 135–145)

## 2020-04-28 LAB — CBC
HCT: 38.7 % (ref 36.0–46.0)
Hemoglobin: 13.4 g/dL (ref 12.0–15.0)
MCH: 30 pg (ref 26.0–34.0)
MCHC: 34.6 g/dL (ref 30.0–36.0)
MCV: 86.8 fL (ref 80.0–100.0)
Platelets: 253 10*3/uL (ref 150–400)
RBC: 4.46 MIL/uL (ref 3.87–5.11)
RDW: 12.8 % (ref 11.5–15.5)
WBC: 9.6 10*3/uL (ref 4.0–10.5)
nRBC: 0 % (ref 0.0–0.2)

## 2020-04-28 NOTE — ED Triage Notes (Signed)
Pt comes via POV from home with c/o seizure that she had yesterday. Pt states hx of seizures and she takes medications. Pt states she feels like it is just lingering. Pt states Neurologist advised her to come here.  Pt states headache and aches.

## 2020-05-30 IMAGING — CT CT ABDOMEN AND PELVIS WITHOUT CONTRAST
2 of 4 series · 16 of 46 positions shown, 18 images · non-contrast
Comparison: 09/23/2018

CLINICAL DATA: Upper abdominal pain, history of stomach carcinoma

EXAM:
CT ABDOMEN AND PELVIS WITHOUT CONTRAST
TECHNIQUE: Multidetector CT imaging of the abdomen and pelvis was performed
following the standard protocol without IV contrast.

[Series 2: routine abd/pel wo · axial · 0.95mm/px · z∈[-936,-521]mm · 13 of 91 slices shown, 15 images]
[im 4/91  soft-tissue]
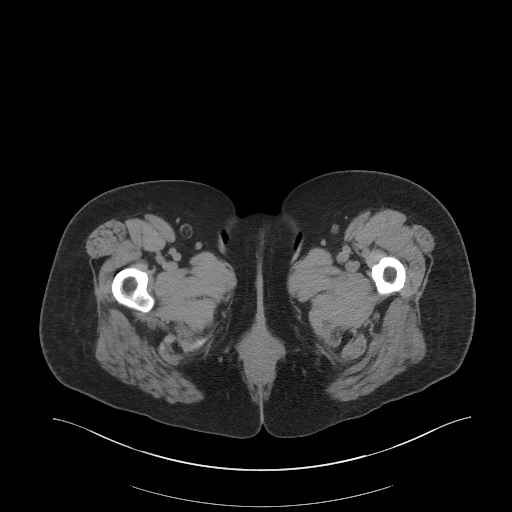
[im 4/91  bone]
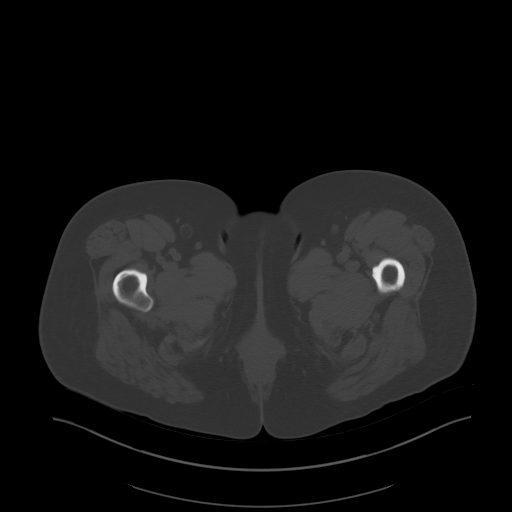
[im 12/91  soft-tissue]
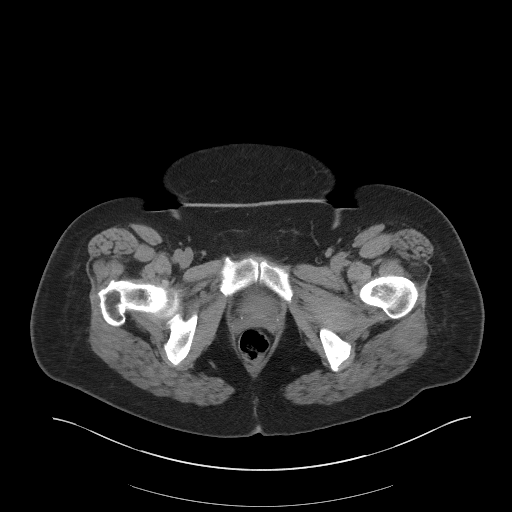
[im 19/91  soft-tissue]
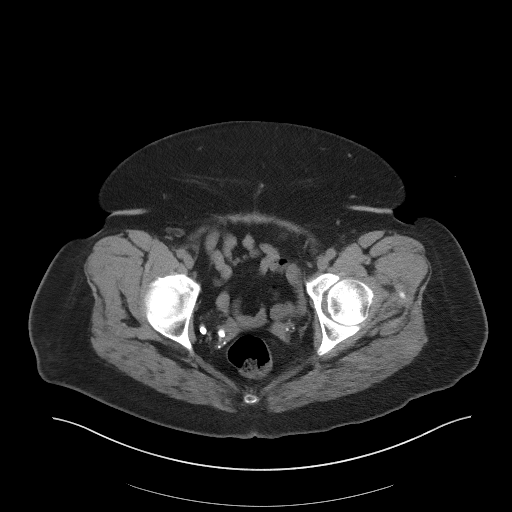
[im 27/91  soft-tissue]
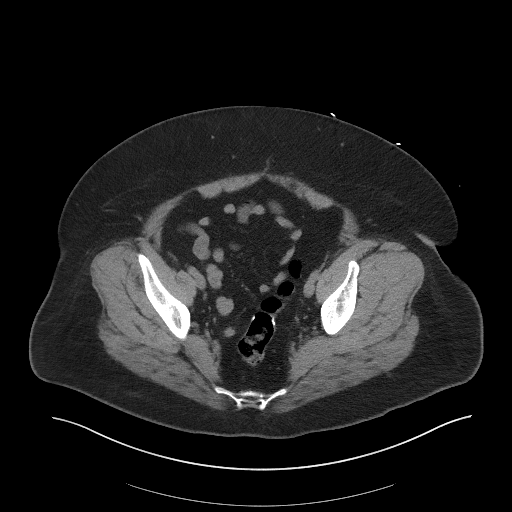
[im 31/91  soft-tissue]
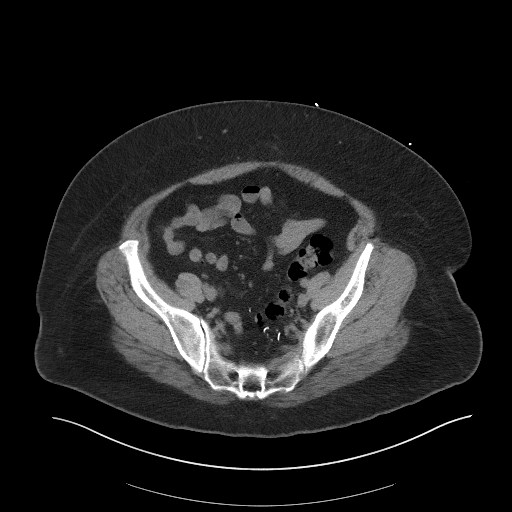
[im 38/91  soft-tissue]
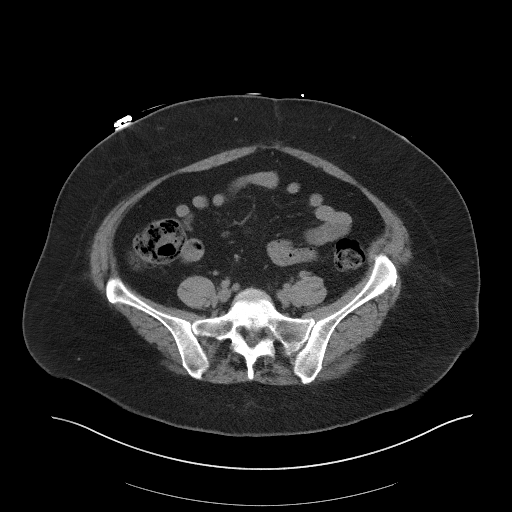
[im 46/91  soft-tissue]
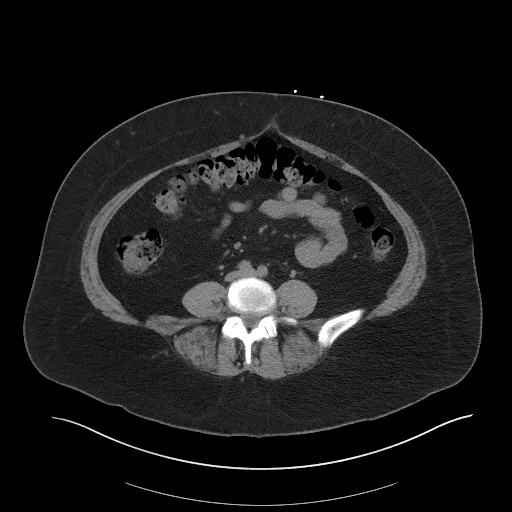
[im 53/91  soft-tissue]
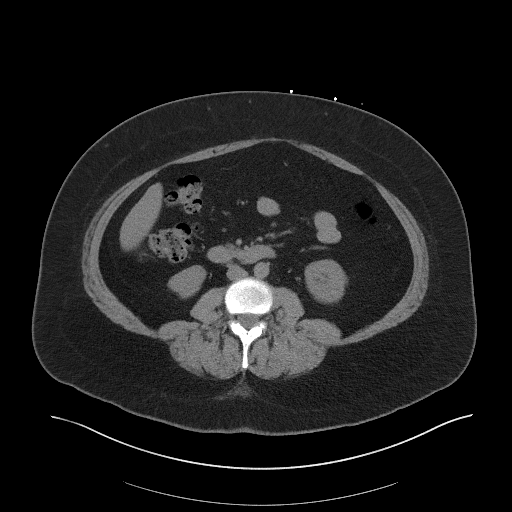
[im 61/91  soft-tissue]
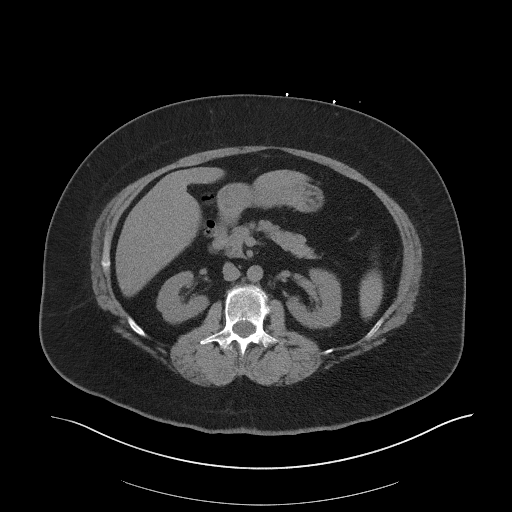
[im 61/91  bone]
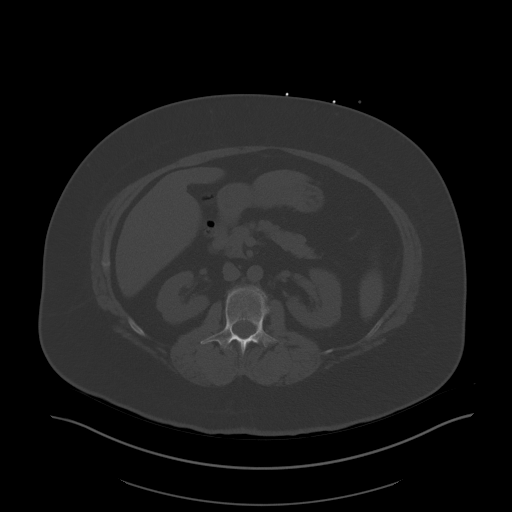
[im 64/91  soft-tissue]
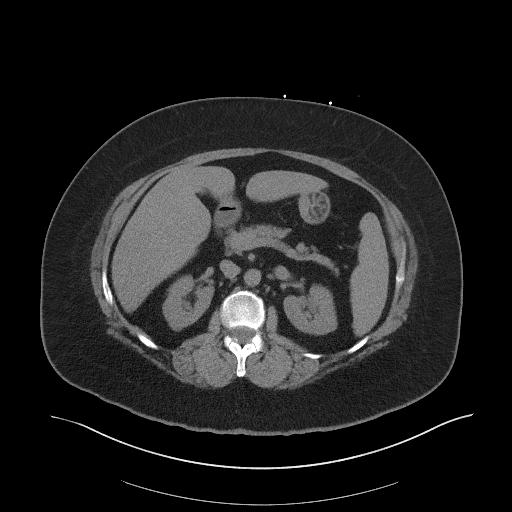
[im 72/91  soft-tissue]
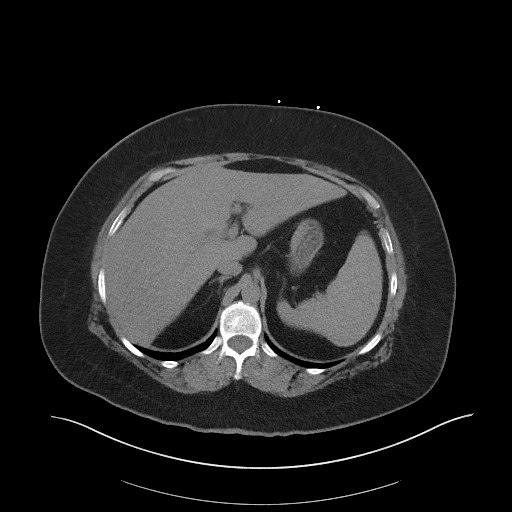
[im 79/91  soft-tissue]
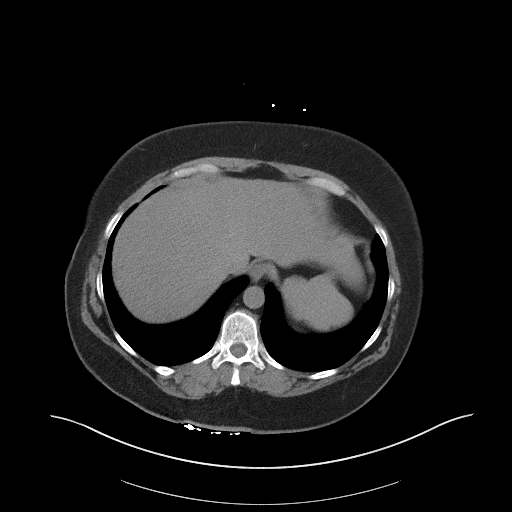
[im 87/91  soft-tissue]
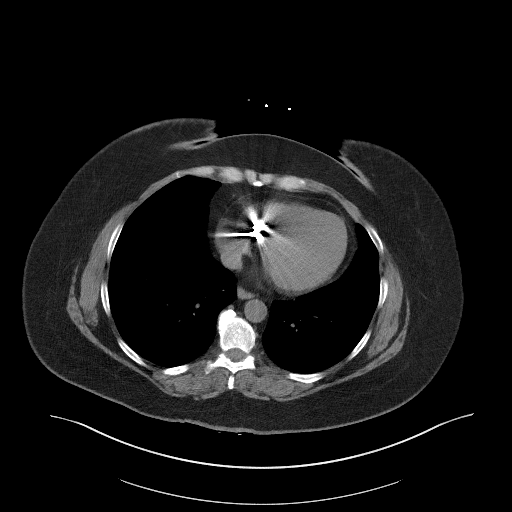

[Series 5: coronal st · coronal · 0.81mm/px · 3 of 103 slices shown]
[im 35/103  soft-tissue]
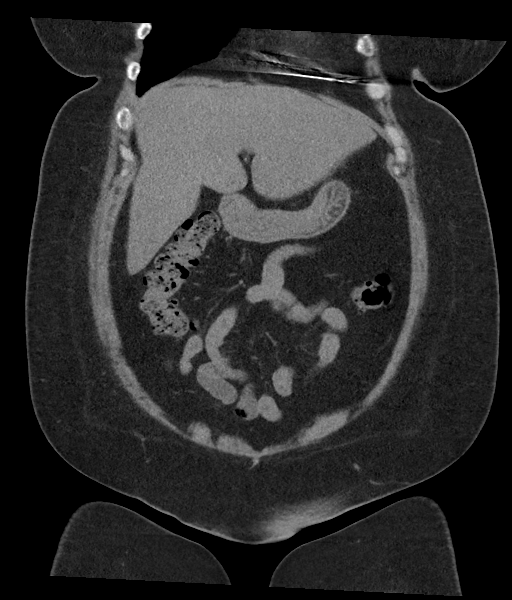
[im 46/103  soft-tissue]
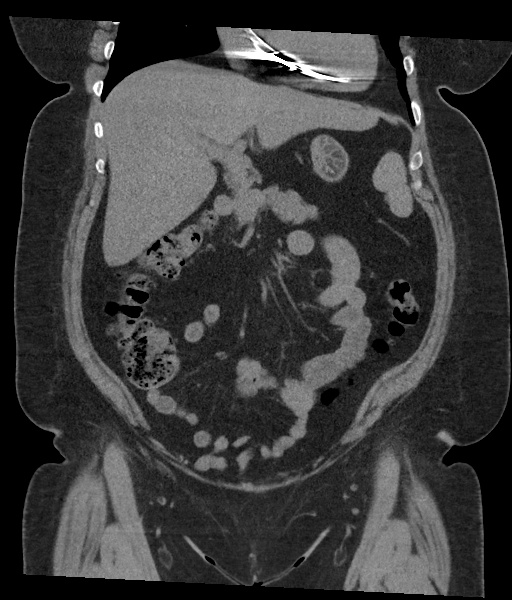
[im 57/103  soft-tissue]
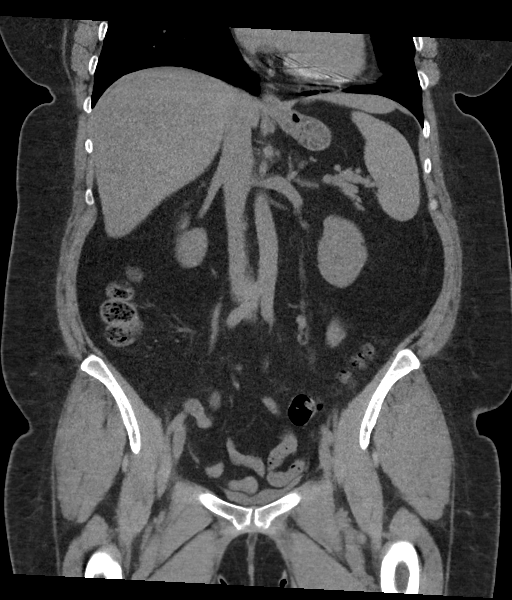

[16 of 46 positions shown; findings below may reference images not displayed]

FINDINGS: Lower chest: No acute abnormality.

Hepatobiliary: Gallbladder has been surgically removed. The liver is
within normal limits.

Pancreas: Unremarkable. No pancreatic ductal dilatation or
surrounding inflammatory changes.

Spleen: Normal in size without focal abnormality.

Adrenals/Urinary Tract: Adrenal glands are within normal limits. The
kidneys show no renal calculi or obstructive changes. The ureters
are within normal limits. Bladder is decompressed.

Stomach/Bowel: Postsurgical changes in the sigmoid colon are seen
without obstructive change. The appendix is not visualized. No
inflammatory changes are seen to suggest appendicitis. No small
bowel abnormality is noted. The stomach is decompressed. A fatty
lesion along the anterior margin of the liver is again identified
and stable in appearance.

Vascular/Lymphatic: No significant vascular findings are present. No
enlarged abdominal or pelvic lymph nodes.

Reproductive: Status post hysterectomy. No adnexal masses.

Other: No abdominal wall hernia or abnormality. No abdominopelvic
ascites.

Musculoskeletal: No acute or significant osseous findings.
IMPRESSION: Stable fatty gastric lesion similar to that seen on the prior exam.

Chronic changes without acute abnormality.

## 2020-06-12 IMAGING — US US EXTREM LOW VENOUS*R*
1 series · 13 of 24 positions shown · non-contrast
Comparison: None.

CLINICAL DATA: Pain, edema and discoloration of the right lower
extremity.



[Series 1: us extrem low venous*right* · 0.09mm/px · 13 of 34 slices shown]
[im 1/34]
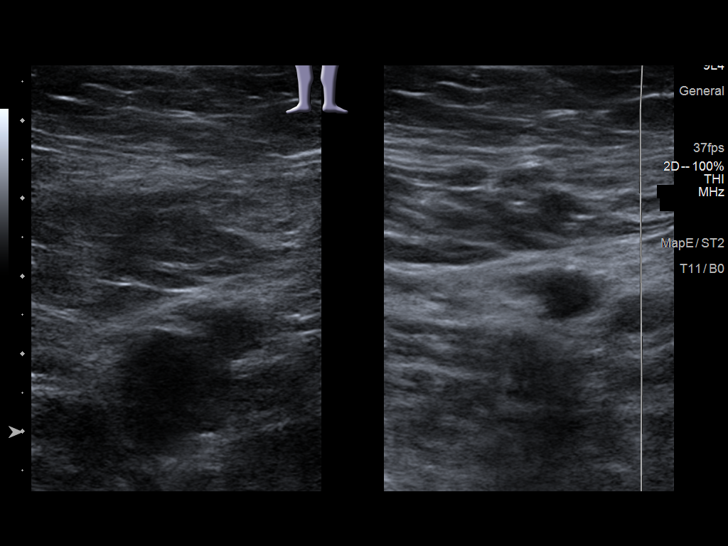
[im 3/34]
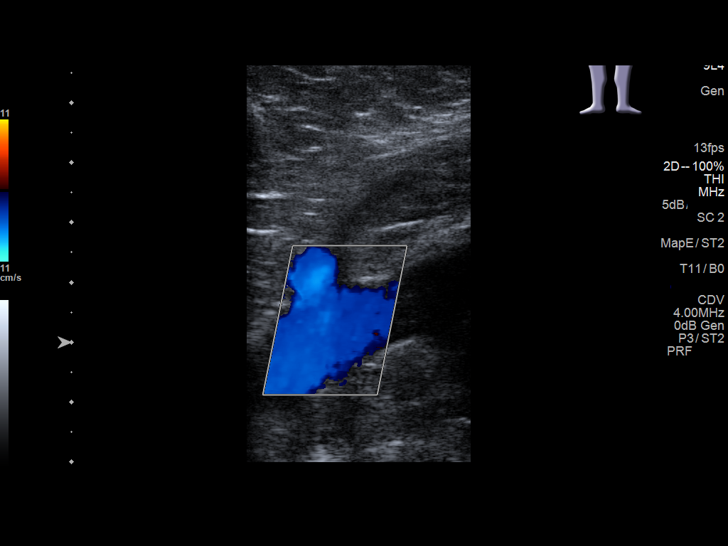
[im 6/34]
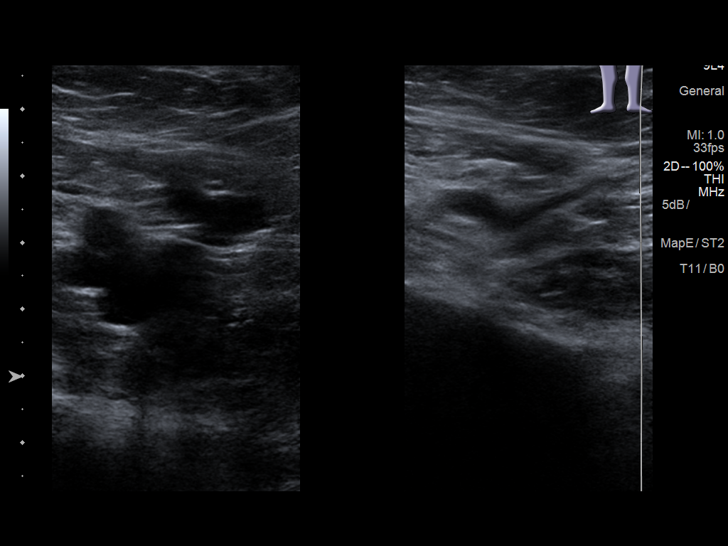
[im 9/34]
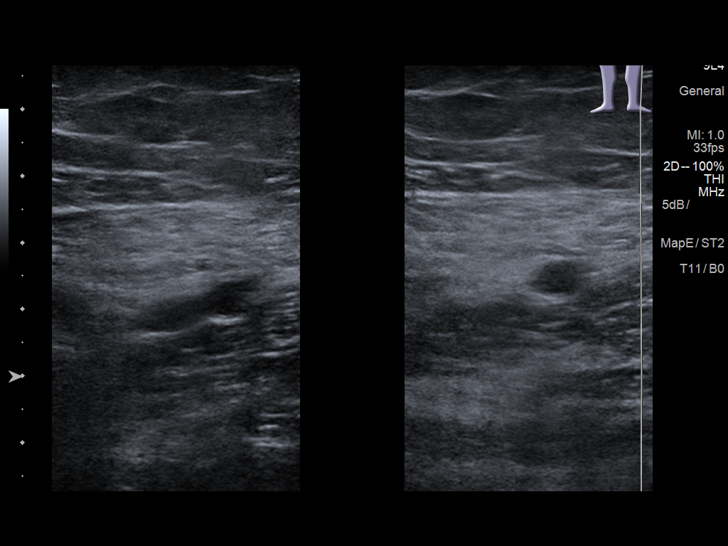
[im 12/34]
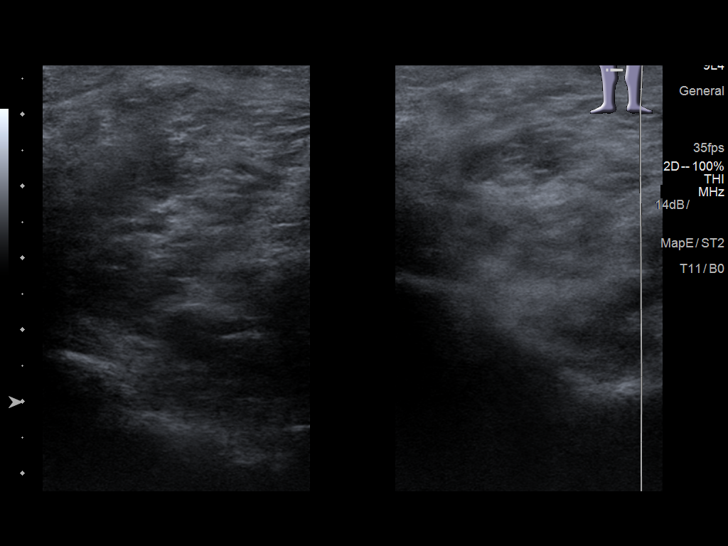
[im 15/34]
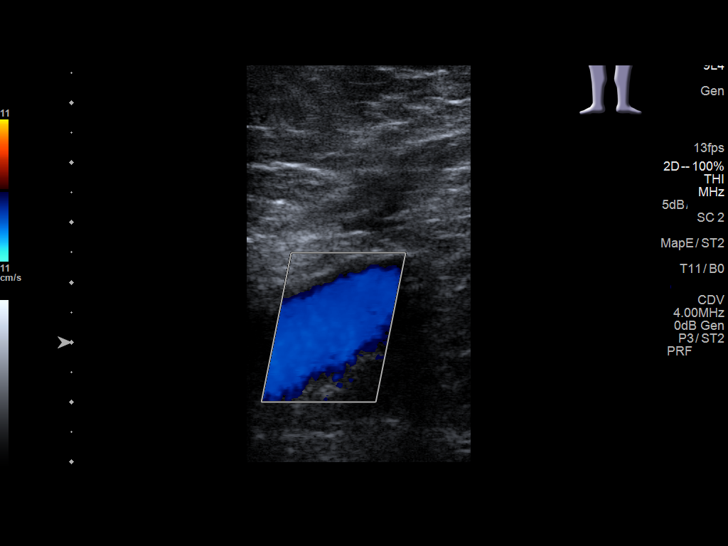
[im 18/34]
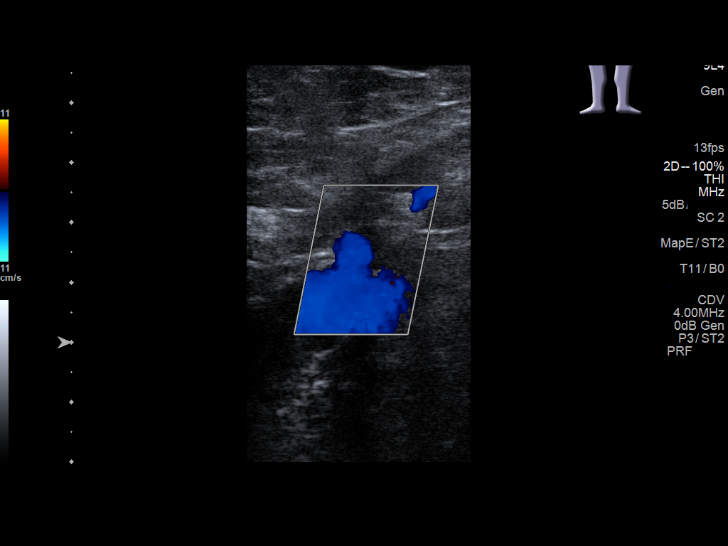
[im 19/34]
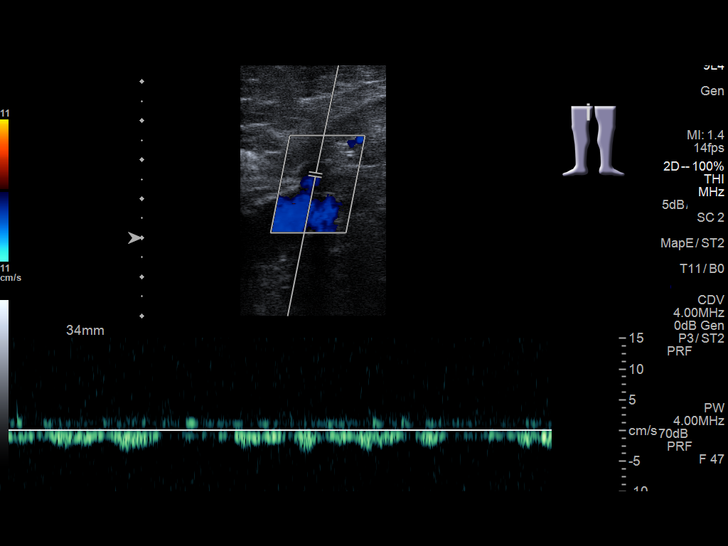
[im 22/34]
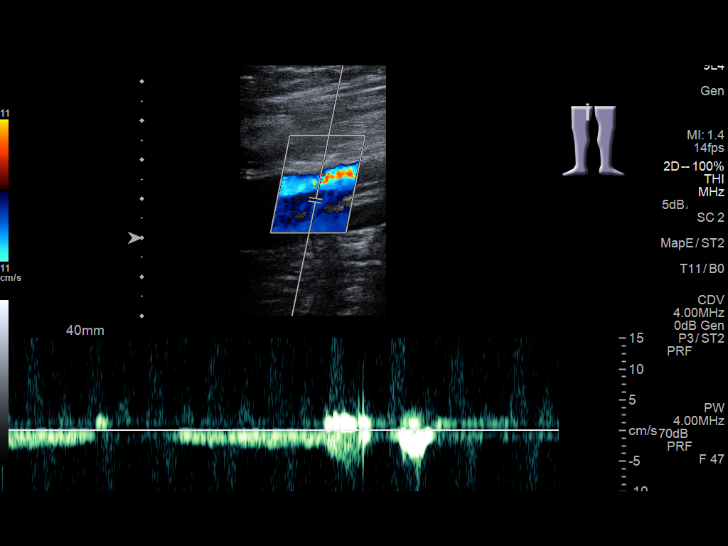
[im 25/34]
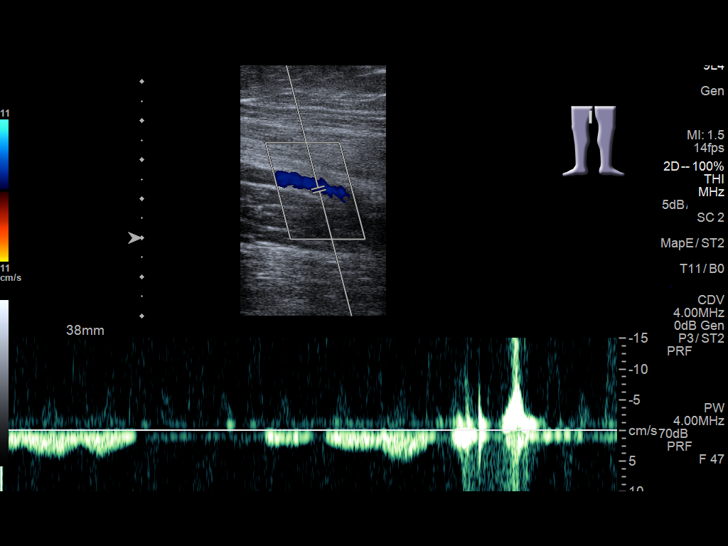
[im 28/34]
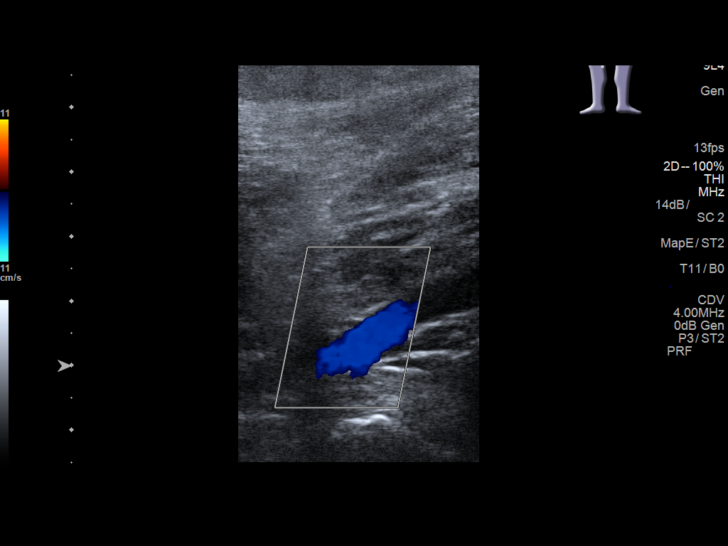
[im 31/34]
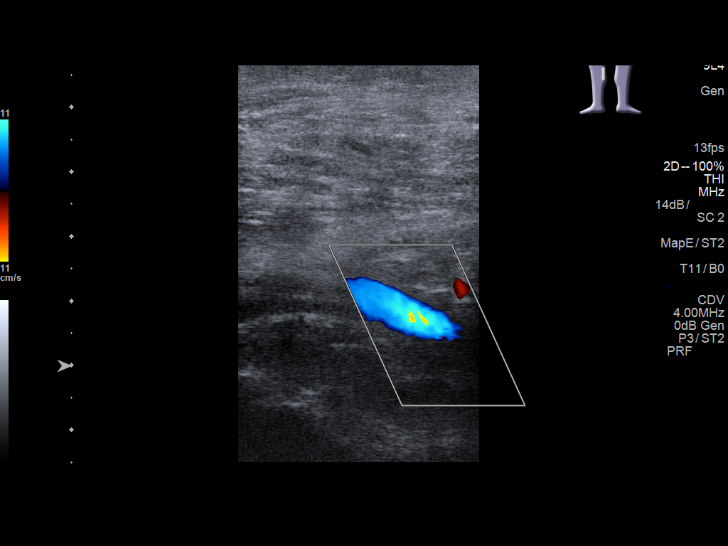
[im 34/34]
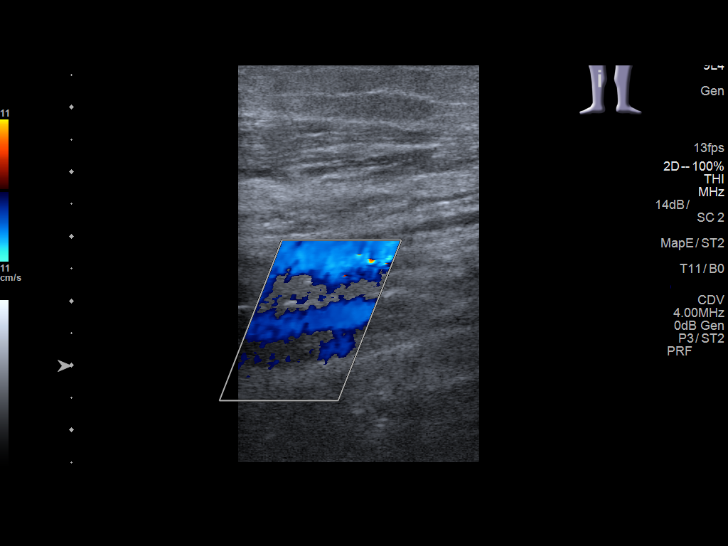

[13 of 24 positions shown; findings below may reference images not displayed]

FINDINGS: Contralateral Common Femoral Vein: Respiratory phasicity is normal
and symmetric with the symptomatic side. No evidence of thrombus.
Normal compressibility.

Common Femoral Vein: No evidence of thrombus. Normal
compressibility, respiratory phasicity and response to augmentation.

Saphenofemoral Junction: No evidence of thrombus. Normal
compressibility and flow on color Doppler imaging.

Profunda Femoral Vein: No evidence of thrombus. Normal
compressibility and flow on color Doppler imaging.

Femoral Vein: No evidence of thrombus. Normal compressibility,
respiratory phasicity and response to augmentation.

Popliteal Vein: No evidence of thrombus. Normal compressibility,
respiratory phasicity and response to augmentation.

Calf Veins: No evidence of thrombus. Normal compressibility and flow
on color Doppler imaging.

Superficial Great Saphenous Vein: No evidence of thrombus. Normal
compressibility.

Venous Reflux:  None.

Other Findings:  None.
IMPRESSION: No evidence of deep venous thrombosis.

## 2020-06-19 DIAGNOSIS — E440 Moderate protein-calorie malnutrition: Secondary | ICD-10-CM | POA: Insufficient documentation

## 2020-07-17 IMAGING — CT CT HEAD WITHOUT CONTRAST
3 series · 16 of 47 positions shown, 19 images · non-contrast
Comparison: CT scan of May 04, 2018.

CLINICAL DATA: Acute left-sided facial numbness.

EXAM:
CT HEAD WITHOUT CONTRAST
TECHNIQUE: Contiguous axial images were obtained from the base of the skull
through the vertex without intravenous contrast.

[Series 2: head wo · axial · 0.40mm/px · z∈[+391,+516]mm · 10 of 30 slices shown, 13 images]
[im 3/30  brain]
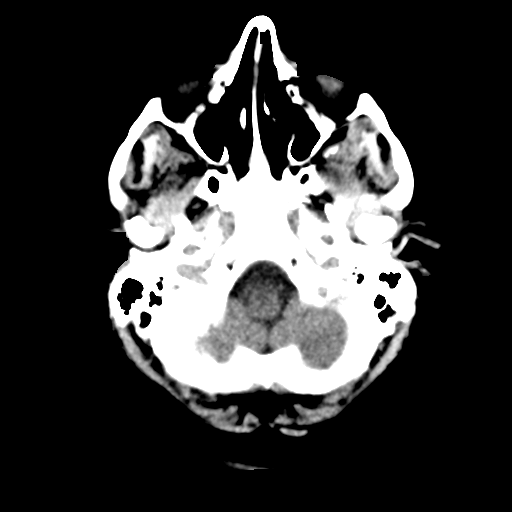
[im 3/30  bone]
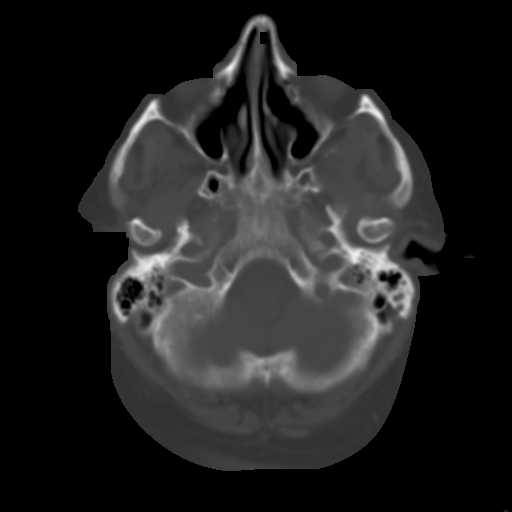
[im 6/30  brain]
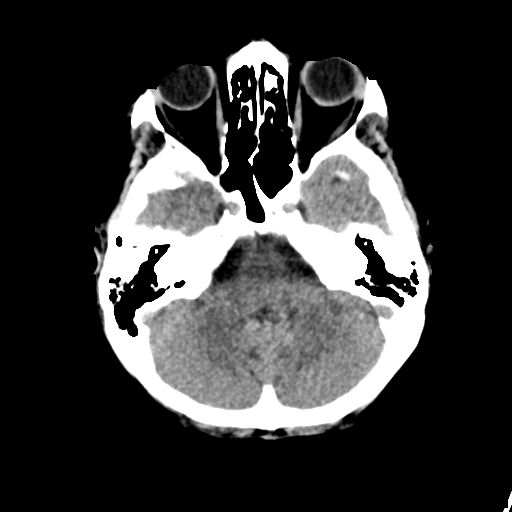
[im 9/30  brain]
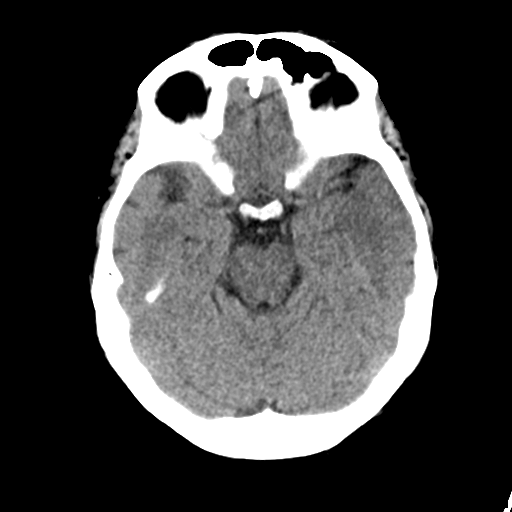
[im 11/30  brain]
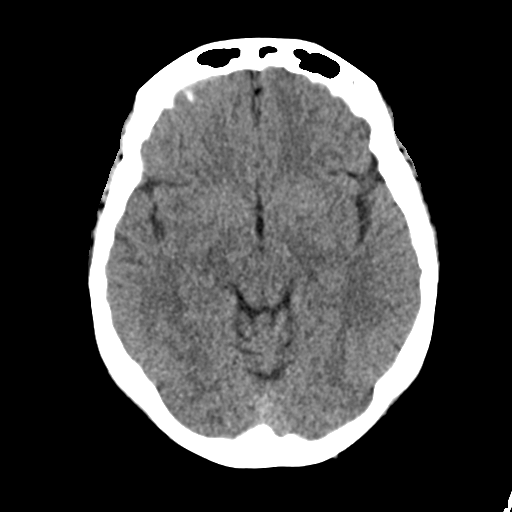
[im 14/30  brain]
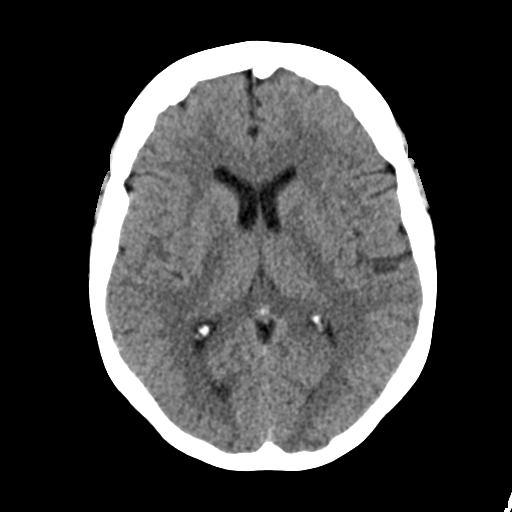
[im 14/30  bone]
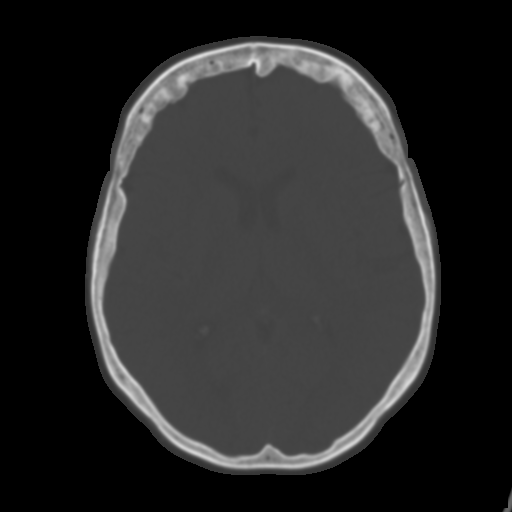
[im 17/30  brain]
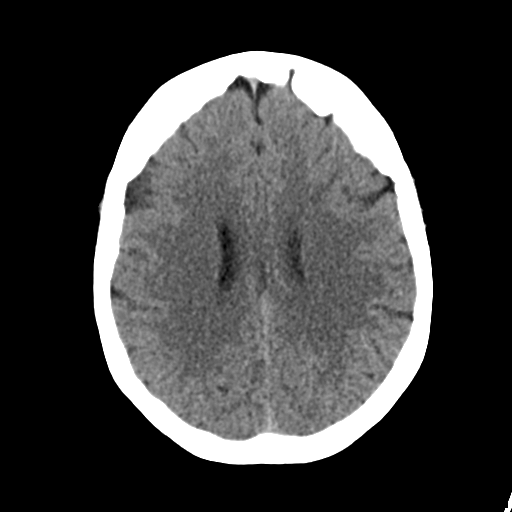
[im 20/30  brain]
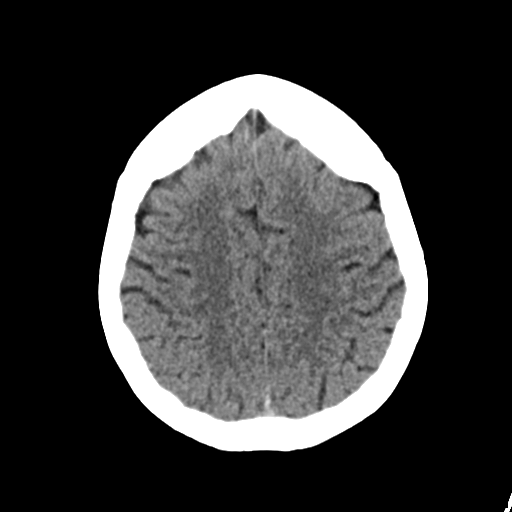
[im 23/30  brain]
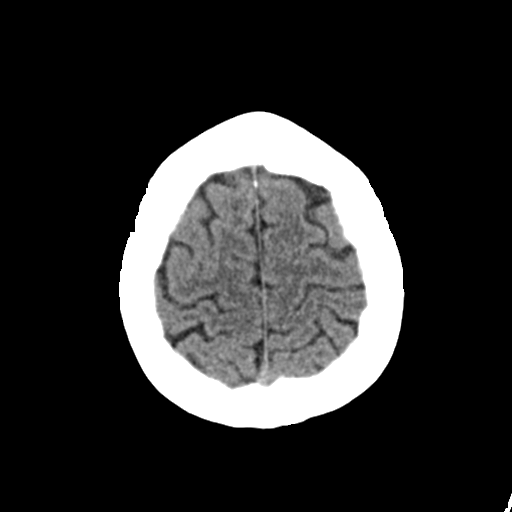
[im 25/30  brain]
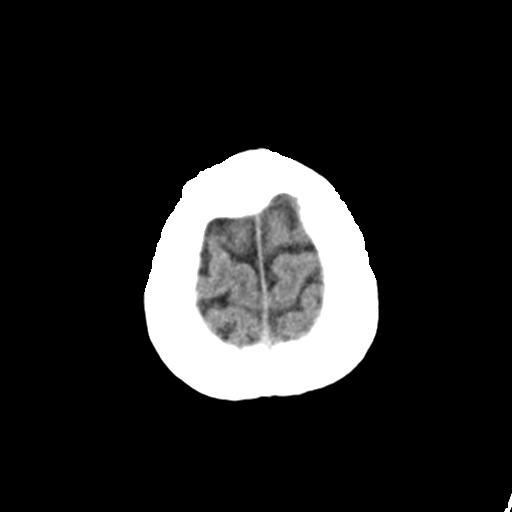
[im 25/30  bone]
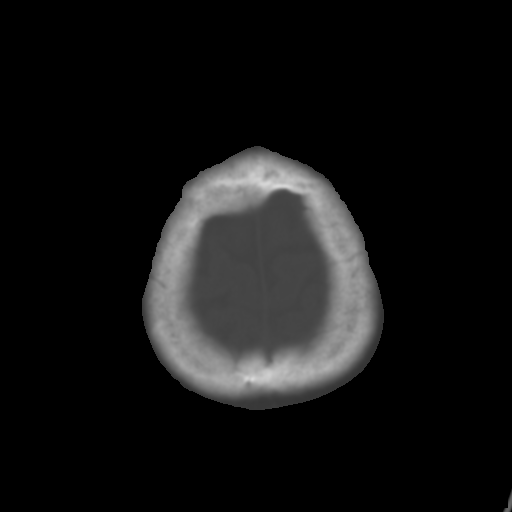
[im 28/30  brain]
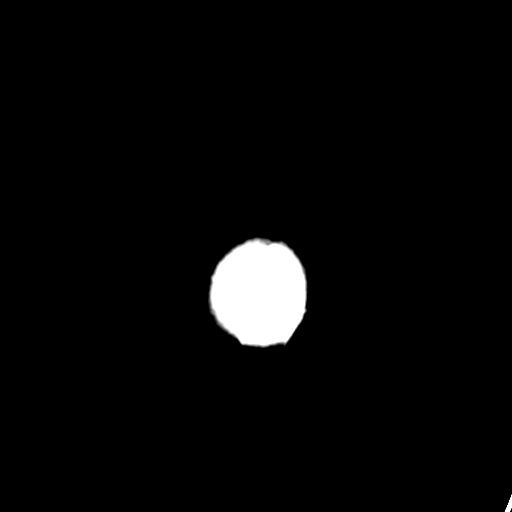

[Series 4: coronal soft tissue · coronal · 0.28mm/px · 3 of 63 slices shown]
[im 21/63  brain]
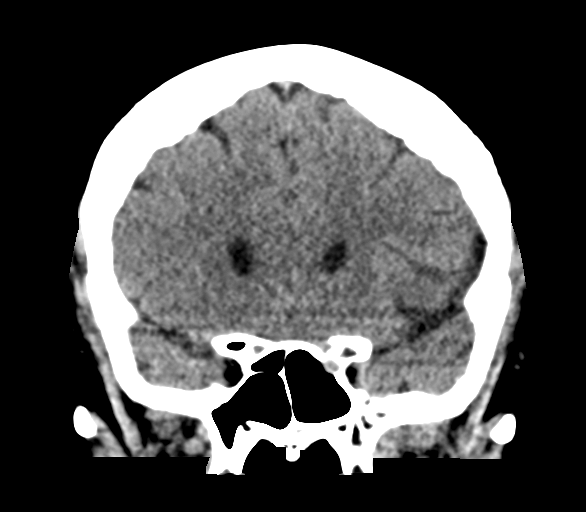
[im 28/63  brain]
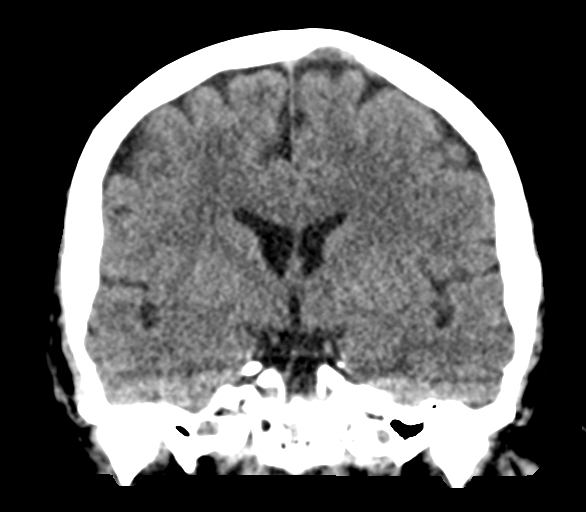
[im 35/63  brain]
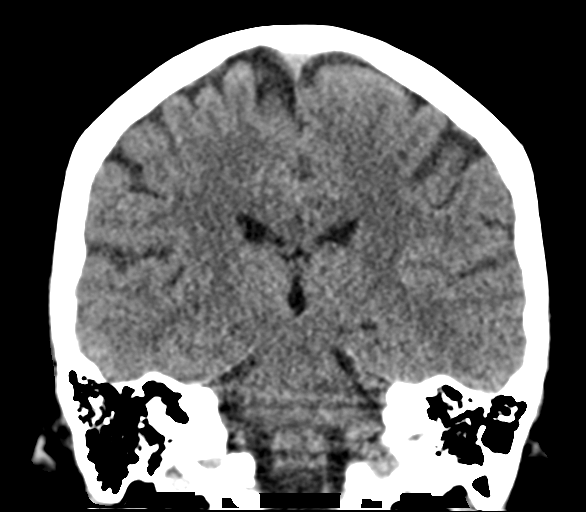

[Series 5: sagittal soft tissue · sagittal · 0.28mm/px · 3 of 53 slices shown]
[im 18/53  brain]
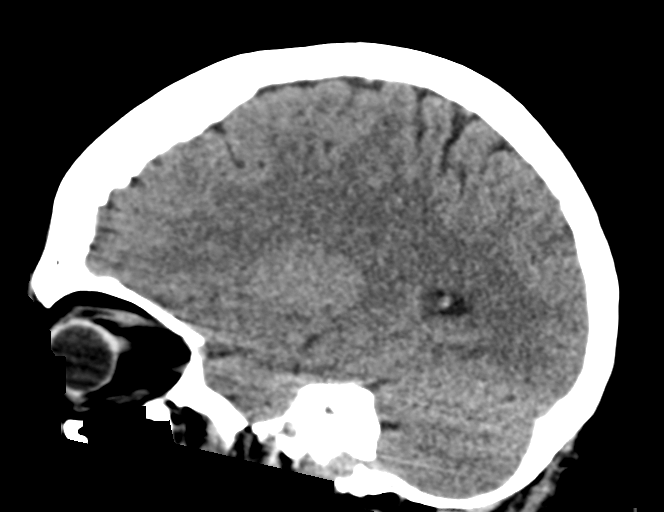
[im 27/53  brain]
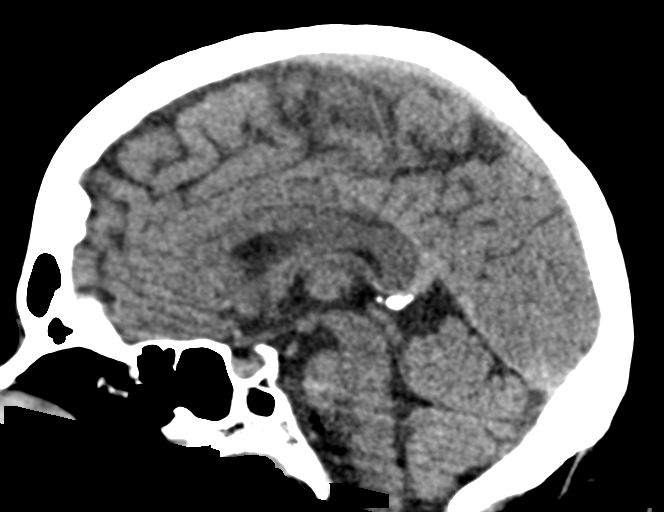
[im 35/53  brain]
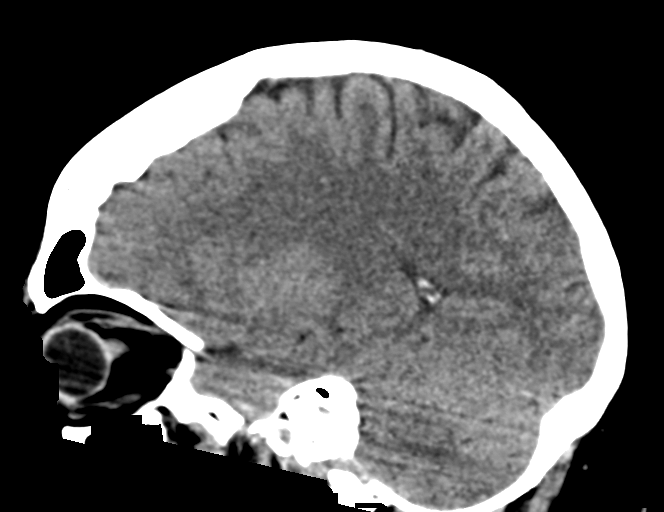

[16 of 47 positions shown; findings below may reference images not displayed]

FINDINGS: Brain: No evidence of acute infarction, hemorrhage, hydrocephalus,
extra-axial collection or mass lesion/mass effect.

Vascular: No hyperdense vessel or unexpected calcification.

Skull: Normal. Negative for fracture or focal lesion.

Sinuses/Orbits: No acute finding.

Other: None.
IMPRESSION: Normal head CT.

## 2020-09-12 ENCOUNTER — Emergency Department
Admit: 2020-09-12 | Discharge: 2020-09-12 | Disposition: A | Discharge status: Home / Self Care | Payer: Medicare Other | Attending: Emergency Medicine | Admitting: Emergency Medicine

## 2020-09-12 ENCOUNTER — Other Ambulatory Visit: Payer: Self-pay

## 2020-09-12 ENCOUNTER — Emergency Department: Payer: Medicare Other

## 2020-09-12 DIAGNOSIS — R062 Wheezing: Secondary | ICD-10-CM | POA: Diagnosis not present

## 2020-09-12 DIAGNOSIS — Z5321 Procedure and treatment not carried out due to patient leaving prior to being seen by health care provider: Secondary | ICD-10-CM | POA: Diagnosis not present

## 2020-09-12 DIAGNOSIS — I509 Heart failure, unspecified: Secondary | ICD-10-CM | POA: Diagnosis not present

## 2020-09-12 DIAGNOSIS — R0602 Shortness of breath: Secondary | ICD-10-CM | POA: Diagnosis not present

## 2020-09-12 DIAGNOSIS — R059 Cough, unspecified: Secondary | ICD-10-CM | POA: Insufficient documentation

## 2020-09-12 LAB — CBC
HCT: 40.1 % (ref 36.0–46.0)
Hemoglobin: 13.8 g/dL (ref 12.0–15.0)
MCH: 29.8 pg (ref 26.0–34.0)
MCHC: 34.4 g/dL (ref 30.0–36.0)
MCV: 86.6 fL (ref 80.0–100.0)
Platelets: 292 10*3/uL (ref 150–400)
RBC: 4.63 MIL/uL (ref 3.87–5.11)
RDW: 13.2 % (ref 11.5–15.5)
WBC: 9 10*3/uL (ref 4.0–10.5)
nRBC: 0 % (ref 0.0–0.2)

## 2020-09-12 LAB — BASIC METABOLIC PANEL
Anion gap: 12 (ref 5–15)
BUN: 11 mg/dL (ref 6–20)
CO2: 27 mmol/L (ref 22–32)
Calcium: 9 mg/dL (ref 8.9–10.3)
Chloride: 98 mmol/L (ref 98–111)
Creatinine, Ser: 0.88 mg/dL (ref 0.44–1.00)
GFR, Estimated: >60 mL/min (ref >60)
Glucose, Bld: 90 mg/dL (ref 70–99)
Potassium: 3.3 mmol/L — ABNORMAL LOW (ref 3.5–5.1)
Sodium: 137 mmol/L (ref 135–145)

## 2020-09-12 LAB — BRAIN NATRIURETIC PEPTIDE: B Natriuretic Peptide: 17.2 pg/mL (ref 0.0–100.0)

## 2020-09-12 NOTE — ED Triage Notes (Addendum)
Pt comes for in for cough and SOB starting a couple of days ago. PCP concerned for CHF exacerbation. Audible tightness and wheezing noted.

## 2020-09-19 ENCOUNTER — Encounter: Payer: Self-pay | Admitting: Emergency Medicine

## 2020-09-19 ENCOUNTER — Emergency Department: Payer: Medicare Other

## 2020-09-19 ENCOUNTER — Other Ambulatory Visit: Payer: Self-pay

## 2020-09-19 DIAGNOSIS — Z86718 Personal history of other venous thrombosis and embolism: Secondary | ICD-10-CM | POA: Insufficient documentation

## 2020-09-19 DIAGNOSIS — Z79890 Hormone replacement therapy (postmenopausal): Secondary | ICD-10-CM | POA: Insufficient documentation

## 2020-09-19 DIAGNOSIS — E876 Hypokalemia: Secondary | ICD-10-CM | POA: Diagnosis not present

## 2020-09-19 DIAGNOSIS — J449 Chronic obstructive pulmonary disease, unspecified: Secondary | ICD-10-CM | POA: Diagnosis not present

## 2020-09-19 DIAGNOSIS — Z888 Allergy status to other drugs, medicaments and biological substances status: Secondary | ICD-10-CM | POA: Insufficient documentation

## 2020-09-19 DIAGNOSIS — R059 Cough, unspecified: Secondary | ICD-10-CM | POA: Diagnosis present

## 2020-09-19 DIAGNOSIS — Z5321 Procedure and treatment not carried out due to patient leaving prior to being seen by health care provider: Secondary | ICD-10-CM | POA: Insufficient documentation

## 2020-09-19 DIAGNOSIS — Z885 Allergy status to narcotic agent status: Secondary | ICD-10-CM | POA: Insufficient documentation

## 2020-09-19 DIAGNOSIS — U071 COVID-19: Secondary | ICD-10-CM | POA: Diagnosis not present

## 2020-09-19 DIAGNOSIS — Z79899 Other long term (current) drug therapy: Secondary | ICD-10-CM | POA: Diagnosis not present

## 2020-09-19 DIAGNOSIS — Z880 Allergy status to penicillin: Secondary | ICD-10-CM | POA: Insufficient documentation

## 2020-09-19 DIAGNOSIS — R0989 Other specified symptoms and signs involving the circulatory and respiratory systems: Secondary | ICD-10-CM | POA: Insufficient documentation

## 2020-09-19 DIAGNOSIS — R5381 Other malaise: Secondary | ICD-10-CM | POA: Insufficient documentation

## 2020-09-19 NOTE — ED Triage Notes (Signed)
Pt reports she has had cough, congestion and general malaise x1 1/2 weeks and tonight pt to ED due to feeling of "something" stuck in throat after consuming dinner (potato and salad) x1 hour ago. Pt dry heaving in triage and coughing. Pt able to speak in complete sentences. Air way clear on assessment. Pt denies COVID testing.

## 2020-09-20 ENCOUNTER — Emergency Department
Admission: EM | Admit: 2020-09-20 | Discharge: 2020-09-20 | Disposition: A | Discharge status: Home / Self Care | Payer: Medicare Other | Source: Home / Self Care | Attending: Emergency Medicine | Admitting: Emergency Medicine

## 2020-09-20 ENCOUNTER — Encounter: Payer: Self-pay | Admitting: Emergency Medicine

## 2020-09-20 ENCOUNTER — Emergency Department
Admission: EM | Admit: 2020-09-20 | Discharge: 2020-09-20 | Disposition: A | Discharge status: Home / Self Care | Payer: Medicare Other | Attending: Emergency Medicine | Admitting: Emergency Medicine

## 2020-09-20 ENCOUNTER — Other Ambulatory Visit: Payer: Self-pay

## 2020-09-20 ENCOUNTER — Emergency Department: Payer: Medicare Other

## 2020-09-20 DIAGNOSIS — E119 Type 2 diabetes mellitus without complications: Secondary | ICD-10-CM | POA: Insufficient documentation

## 2020-09-20 DIAGNOSIS — Z86718 Personal history of other venous thrombosis and embolism: Secondary | ICD-10-CM | POA: Insufficient documentation

## 2020-09-20 DIAGNOSIS — Z95810 Presence of automatic (implantable) cardiac defibrillator: Secondary | ICD-10-CM | POA: Insufficient documentation

## 2020-09-20 DIAGNOSIS — Z79899 Other long term (current) drug therapy: Secondary | ICD-10-CM | POA: Insufficient documentation

## 2020-09-20 DIAGNOSIS — E890 Postprocedural hypothyroidism: Secondary | ICD-10-CM | POA: Insufficient documentation

## 2020-09-20 DIAGNOSIS — Z85028 Personal history of other malignant neoplasm of stomach: Secondary | ICD-10-CM | POA: Insufficient documentation

## 2020-09-20 DIAGNOSIS — I509 Heart failure, unspecified: Secondary | ICD-10-CM | POA: Insufficient documentation

## 2020-09-20 DIAGNOSIS — R112 Nausea with vomiting, unspecified: Secondary | ICD-10-CM | POA: Insufficient documentation

## 2020-09-20 DIAGNOSIS — Z91040 Latex allergy status: Secondary | ICD-10-CM | POA: Insufficient documentation

## 2020-09-20 DIAGNOSIS — E876 Hypokalemia: Secondary | ICD-10-CM

## 2020-09-20 DIAGNOSIS — R1110 Vomiting, unspecified: Secondary | ICD-10-CM

## 2020-09-20 DIAGNOSIS — U071 COVID-19: Secondary | ICD-10-CM | POA: Insufficient documentation

## 2020-09-20 DIAGNOSIS — J449 Chronic obstructive pulmonary disease, unspecified: Secondary | ICD-10-CM | POA: Insufficient documentation

## 2020-09-20 LAB — BASIC METABOLIC PANEL
Anion gap: 12 (ref 5–15)
BUN: 13 mg/dL (ref 6–20)
CO2: 27 mmol/L (ref 22–32)
Calcium: 8.8 mg/dL — ABNORMAL LOW (ref 8.9–10.3)
Chloride: 100 mmol/L (ref 98–111)
Creatinine, Ser: 1 mg/dL (ref 0.44–1.00)
GFR, Estimated: >60 mL/min (ref >60)
Glucose, Bld: 116 mg/dL — ABNORMAL HIGH (ref 70–99)
Potassium: 2.8 mmol/L — ABNORMAL LOW (ref 3.5–5.1)
Sodium: 139 mmol/L (ref 135–145)

## 2020-09-20 LAB — CBC
HCT: 39.7 % (ref 36.0–46.0)
Hemoglobin: 13.9 g/dL (ref 12.0–15.0)
MCH: 29.8 pg (ref 26.0–34.0)
MCHC: 35 g/dL (ref 30.0–36.0)
MCV: 85 fL (ref 80.0–100.0)
Platelets: 214 10*3/uL (ref 150–400)
RBC: 4.67 MIL/uL (ref 3.87–5.11)
RDW: 13.2 % (ref 11.5–15.5)
WBC: 6.3 10*3/uL (ref 4.0–10.5)
nRBC: 0 % (ref 0.0–0.2)

## 2020-09-20 LAB — HEPATIC FUNCTION PANEL
ALT: 17 U/L (ref 0–44)
AST: 20 U/L (ref 15–41)
Albumin: 3.9 g/dL (ref 3.5–5.0)
Alkaline Phosphatase: 92 U/L (ref 38–126)
Bilirubin, Direct: <0.1 mg/dL (ref 0.0–0.2)
Total Bilirubin: 0.5 mg/dL (ref 0.3–1.2)
Total Protein: 7.5 g/dL (ref 6.5–8.1)

## 2020-09-20 LAB — POC SARS CORONAVIRUS 2 AG -  ED: SARS Coronavirus 2 Ag: POSITIVE — AB

## 2020-09-20 LAB — MAGNESIUM: Magnesium: 2.2 mg/dL (ref 1.7–2.4)

## 2020-09-20 LAB — TROPONIN I (HIGH SENSITIVITY): Troponin I (High Sensitivity): 3 ng/L (ref <18)

## 2020-09-20 LAB — LIPASE, BLOOD: Lipase: 65 U/L — ABNORMAL HIGH (ref 11–51)

## 2020-09-20 MED ORDER — DROPERIDOL 2.5 MG/ML IJ SOLN: 1.2500 mg | Freq: Once | INTRAMUSCULAR | Status: DC

## 2020-09-20 MED ORDER — METOCLOPRAMIDE HCL 5 MG/ML IJ SOLN
10.0000 mg | Freq: Once | INTRAMUSCULAR | Status: DC
  Filled 2020-09-20: qty 2

## 2020-09-20 MED ORDER — SODIUM CHLORIDE 0.9 % IV BOLUS
1000.0000 mL | Freq: Once | INTRAVENOUS | Status: AC
  Administered 2020-09-20: 1000 mL via INTRAVENOUS

## 2020-09-20 MED ORDER — DROPERIDOL 2.5 MG/ML IJ SOLN: 2.5000 mg | Freq: Once | INTRAMUSCULAR | Status: DC

## 2020-09-20 MED ORDER — POTASSIUM CHLORIDE ER 10 MEQ PO TBCR: 20.0000 meq | EXTENDED_RELEASE_TABLET | Freq: Every day | ORAL | 0 refills | Status: DC

## 2020-09-20 MED ORDER — KETOROLAC TROMETHAMINE 30 MG/ML IJ SOLN
15.0000 mg | Freq: Once | INTRAMUSCULAR | Status: AC
  Administered 2020-09-20: 15 mg via INTRAVENOUS
  Filled 2020-09-20: qty 1

## 2020-09-20 MED ORDER — PROMETHAZINE HCL 25 MG/ML IJ SOLN
12.5000 mg | Freq: Once | INTRAMUSCULAR | Status: AC
  Administered 2020-09-20: 12.5 mg via INTRAVENOUS
  Filled 2020-09-20: qty 1

## 2020-09-20 MED ORDER — ACETAMINOPHEN 10 MG/ML IV SOLN
1000.0000 mg | Freq: Four times a day (QID) | INTRAVENOUS | Status: DC
  Administered 2020-09-20: 1000 mg via INTRAVENOUS
  Filled 2020-09-20 (×2): qty 100

## 2020-09-20 MED ORDER — POTASSIUM CHLORIDE 10 MEQ/100ML IV SOLN
10.0000 meq | INTRAVENOUS | Status: AC
  Administered 2020-09-20 (×2): 10 meq via INTRAVENOUS
  Filled 2020-09-20: qty 100

## 2020-09-20 MED ORDER — HALOPERIDOL LACTATE 5 MG/ML IJ SOLN
2.0000 mg | Freq: Once | INTRAMUSCULAR | Status: AC
  Administered 2020-09-20: 2 mg via INTRAVENOUS
  Filled 2020-09-20: qty 1

## 2020-09-20 NOTE — ED Notes (Signed)
Writer attempted to call pt by phone but unable to reach. No answer in lobby.

## 2020-09-20 NOTE — ED Provider Notes (Addendum)
[facility] Emergency Department Provider Note  ____________________________________________   Event Date/Time   First MD Initiated Contact with Patient 09/20/20 [phone]     (approximate)  I have reviewed the triage vital signs and the nursing notes.   HISTORY  Chief Complaint Chest Pain, Diarrhea, and Cough    HPI Brittany Nguyen is a 55 y.o. female with COPD, DVT, seizures who comes in with nausea vomiting and chest pain. Pt reports since yesterday she has had NBNB vomiting the color of what she tries to eat as well as diarrhea multiple times a day. This is intermittent, takes phenergan at home without much improvement, nothing makes it worse. She reports associated cough. No SOB. Does have some chest pain mid sternal as well. Reports some upper abdominal pain as well.  She has had gallbladder removed. Denies leg swelling. Husband also sick with body aches. Have had covid vaccine.           Past Medical History:  Diagnosis Date  . Asthma 2013  . 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 CHF (congestive heart failure) ([place])   . COPD (chronic obstructive pulmonary disease) ([place])   . Diverticulitis 2010  . DVT (deep venous thrombosis) (Brittany Nguyen)   . Endometriosis 1990  . Heart disease 2013  . Hx MRSA infection   . Iron deficiency   . Lump or mass in breast   . Restless leg   . Seizures ([place])   . 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 Non-ischemic cardiomyopathy (Brittany Nguyen) 01/23/2020  . 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/23/2020  . Seizure (Brittany Nguyen) 01/23/2020  . 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 Chronic use of opiate for therapeutic purpose 01/23/2020  . Type 2 diabetes mellitus without complication (Brittany Nguyen) [id number]  . Iron deficiency anemia 03/27/2018  . Anemia 03/26/2018  . Abdominal pain 02/20/2018  .  Unstable angina (Brittany Nguyen)   . Encounter for anticoagulation discussion and counseling   . C. difficile colitis 01/14/2018  . GIB (gastrointestinal bleeding) 01/08/2018  . SOB (shortness of breath) 11/01/2017  . Influenza A 10/29/2017  . Acute respiratory failure (Brittany Nguyen) 09/24/2017  . Sepsis (Brittany Nguyen) 07/03/2017  . COPD (chronic obstructive pulmonary disease) ([place]) 04/26/2017  . Community acquired pneumonia 04/25/2017  . Chronic systolic heart failure (Brittany Nguyen) 02/03/2017  . Hypotension 02/03/2017  . Chest pain 12/09/2016  . RLS (restless legs syndrome) 12/09/2016  . Cough, persistent 11/22/2015  . Hypokalemia 11/22/2015  . Leukocytosis 11/22/2015  . Dyspnea 11/21/2015  . Respiratory distress 09/19/2015  . Breast pain 01/11/2013  . Hx MRSA infection   . Lump or mass in breast   . GOITER, MULTINODULAR 01/20/2009  . HYPOGLYCEMIA, UNSPECIFIED 01/20/2009  . GERD 01/20/2009  . DIVERTICULITIS OF COLON 01/20/2009    Past Surgical History:  Procedure Laterality Date  . ABDOMINAL HYSTERECTOMY     age 17  . BREAST BIOPSY Right 2014   benign  . CARDIAC DEFIBRILLATOR PLACEMENT    . CARDIAC DEFIBRILLATOR PLACEMENT    . CESAREAN SECTION    . CHOLECYSTECTOMY    . COLECTOMY    . COLONOSCOPY WITH PROPOFOL N/A 02/22/2018   Procedure: COLONOSCOPY WITH PROPOFOL;  Surgeon: Toledo, Benay Pike, MD;  Location: [facility] ENDOSCOPY;  Service: Gastroenterology;  Laterality: N/A;  . ESOPHAGOGASTRODUODENOSCOPY (EGD) WITH PROPOFOL N/A 02/22/2018   Procedure: ESOPHAGOGASTRODUODENOSCOPY (EGD) WITH PROPOFOL;  Surgeon: Halstad, Jeannette  K, MD;  Location: [facility] ENDOSCOPY;  Service: Gastroenterology;  Laterality: N/A;  . NASAL SINUS SURGERY  2012  . OOPHORECTOMY      Prior to Admission medications   Medication Sig Start Date End Date Taking? Authorizing Provider  albuterol (ACCUNEB) 1.25 MG/3ML nebulizer solution Take 3 mLs by nebulization every 6 (six) hours as needed for wheezing. 11/09/17   [provider]  albuterol  (VENTOLIN HFA) 108 (90 Base) MCG/ACT inhaler Inhale 2 puffs into the lungs every 6 (six) hours as needed for shortness of breath. 08/28/16   [provider]  beclomethasone (QVAR) 80 MCG/ACT inhaler Inhale 1 puff into the lungs 2 (two) times daily. 06/12/17   [provider]  busPIRone (BUSPAR) 15 MG tablet Take 15 mg by mouth 2 (two) times daily as needed.  05/08/18   [provider]  DULoxetine (CYMBALTA) 30 MG capsule Take 30 mg by mouth daily. 10/27/19   [provider]  ENTRESTO 24-26 MG Take 0.5 tablets by mouth 2 (two) times daily. 01/08/20   [provider]  famotidine (PEPCID) 20 MG tablet Take 1 tablet (20 mg total) by mouth 2 (two) times daily. 03/15/19   Carrie Mew, MD  FARXIGA 5 MG TABS tablet Take 5 mg by mouth daily. 11/11/19   [provider]  fentaNYL (DURAGESIC) 50 MCG/HR Place 1 patch onto the skin every 3 (three) days. 10/08/18   Epifanio Lesches, MD  furosemide (LASIX) 40 MG tablet Take 80 mg by mouth 2 (two) times daily.  07/09/18   [provider]  hydrOXYzine (ATARAX/VISTARIL) 25 MG tablet Take 25 mg by mouth every 6 (six) hours as needed for itching. 04/13/19   [provider]  levothyroxine (SYNTHROID) 175 MCG tablet Take 175 mcg by mouth daily before breakfast.     [provider]  LORazepam (ATIVAN) 1 MG tablet Take 1 mg by mouth 4 (four) times daily as needed. 12/20/19   [provider]  losartan (COZAAR) 25 MG tablet Take 25 mg by mouth daily.     [provider]  metoCLOPramide (REGLAN) 10 MG tablet Take 1 tablet (10 mg total) by mouth every 8 (eight) hours as needed for up to 3 days for nausea. 02/11/20 02/14/20  Rudene Re, MD  metoprolol succinate (TOPROL-XL) 50 MG 24 hr tablet Take 50 mg by mouth 2 (two) times daily. 01/25/19   [provider]  mirtazapine (REMERON) 15 MG tablet Take 15 mg by mouth at bedtime. 04/26/19 05/26/24  [provider]   morphine (MSIR) 15 MG tablet Take 15 mg by mouth as needed for severe pain.     [provider]  Multiple Vitamins-Minerals (ONE-A-DAY WOMENS PO) Take 1 tablet by mouth daily.    [provider]  nitroGLYCERIN (NITROSTAT) 0.4 MG SL tablet Place 1 tablet (0.4 mg total) under the tongue every 5 (five) minutes as needed for chest pain. 01/24/17   Henreitta Leber, MD  omeprazole (PRILOSEC) 40 MG capsule Take 40 mg by mouth 2 (two) times daily. 10/19/18   [provider]  oxyCODONE (OXY IR/ROXICODONE) 5 MG immediate release tablet Take 5-10 mg by mouth every 4 (four) hours as needed for pain. 04/11/19   [provider]  oxyCODONE-acetaminophen (PERCOCET) 5-325 MG tablet Take 1 tablet by mouth every 4 (four) hours as needed. 02/11/20   Rudene Re, MD  oxyCODONE-acetaminophen (PERCOCET/ROXICET) 5-325 MG tablet Take 1 tablet by mouth every 4 (four) hours as needed.  01/17/20   [provider]  Pancrelipase, Lip-Prot-Amyl, (CREON) 24000-76000 units CPEP Take 3-4 capsules by mouth 3 (three) times daily with meals. 04/01/19   [provider]  pantoprazole (PROTONIX) 40 MG tablet Take 40 mg by mouth 2 (two) times daily. 12/20/19   [provider]  potassium chloride SA (KLOR-CON M20) 20 MEQ tablet Take 1 tablet (20 mEq total) by mouth 2 (two) times daily for 7 days. 07/09/19 01/23/20  Carrie Mew, MD  promethazine (PHENERGAN) 25 MG tablet Take 1 tablet (25 mg total) by mouth every 6 (six) hours as needed. 07/09/19   Carrie Mew, MD  rOPINIRole (REQUIP) 4 MG tablet Take 4 mg by mouth at bedtime. 01/30/19   [provider]  spironolactone (ALDACTONE) 50 MG tablet Take 50 mg by mouth daily. 11/26/18   [provider]  sucralfate (CARAFATE) 1 g tablet Take 1 tablet (1 g total) by mouth 4 (four) times daily. 03/15/19   Carrie Mew, MD  vitamin B-12 (CYANOCOBALAMIN) 1000 MCG tablet Take 1,000 mcg by mouth daily.    [provider]    Allergies Contrast media [iodinated diagnostic agents], Lidocaine, Metrizamide, Penicillins, Isosorbide nitrate, Latex, Ondansetron, Zofran [ondansetron hcl], Hydromorphone, Povidone-iodine, and Pulmicort [budesonide]  Family History  Problem Relation Age of Onset  . Cancer Mother 30       ovarian  . CAD Mother   . Cancer Father 6       brain  . CAD Father   . Cancer Daughter 18       skin  . Cancer Maternal Aunt 17       breast  . Leukemia Paternal Grandfather     Social History Social History   Tobacco Use  . Smoking status: Never Smoker  . Smokeless tobacco: Never Used  Vaping Use  . Vaping Use: Never used  Substance Use Topics  . Alcohol use: No  . Drug use: No      Review of Systems Constitutional: No fever/chills Eyes: No visual changes. ENT: No sore throat. Cardiovascular: Positive chest pain Respiratory: Denies shortness of breath. + cough  Gastrointestinal: + upper abdominal pain, vomiting, diarrhea  Genitourinary: Negative for dysuria. Musculoskeletal: Negative for back pain. Skin: Negative for rash. Neurological: Negative for headaches, focal weakness or numbness. All other ROS negative ____________________________________________   PHYSICAL EXAM:  VITAL SIGNS: ED Triage Vitals  Enc Vitals Group     BP 09/20/20 0717 116/89     Pulse Rate 09/20/20 0717 92     Resp 09/20/20 0717 20     Temp 09/20/20 0717 98.3 F (36.8 C)     Temp Source 09/20/20 0717 Oral     SpO2 09/20/20 0717 100 %     Weight 09/20/20 0715 191 lb (86.6 kg)     Height 09/20/20 0715 5\' 5"  (1.651 m)     Head Circumference --      Peak Flow --      Pain Score 09/20/20 0715 10     Pain Loc --      Pain Edu? --      Excl. in [place]? --     Constitutional: Alert and oriented. Well appearing with dry heaving  Eyes: Conjunctivae are normal. EOMI. Head: Atraumatic. Nose: No congestion/rhinnorhea. Mouth/Throat: Mucous membranes are moist.   Neck: No stridor.  Trachea Midline. FROM Cardiovascular: Normal rate, regular rhythm. Grossly normal heart sounds.  Good peripheral circulation. No chest wall tenderness  Respiratory: Normal respiratory effort.  No retractions. Lungs CTAB. Gastrointestinal: Soft slight  tender in upper abdomen, no rebound or gaurding  No distention. No abdominal bruits.  Musculoskeletal: No lower extremity tenderness nor edema.  No joint effusions. Neurologic:  Normal speech and language. No gross focal neurologic deficits are appreciated.  Skin:  Skin is warm, dry and intact. No rash noted. Psychiatric: Mood and affect are normal. Speech and behavior are normal. GU: Deferred   ____________________________________________   LABS (all labs ordered are listed, but only abnormal results are displayed)  Labs Reviewed  BASIC METABOLIC PANEL - Abnormal; Notable for the following components:      Result Value   Potassium 2.8 (*)    Glucose, Bld 116 (*)    Calcium 8.8 (*)    All other components within normal limits  CBC  TROPONIN I (HIGH SENSITIVITY)   ____________________________________________   ED ECG REPORT I, Vanessa Pearson, the attending physician, personally viewed and interpreted this ECG.  Normal sinus rate of 91, no ST elevation, some T wave inversion in V2 and elevated lead to, normal intervals.  Reviewed prior EKG looks pretty similar ____________________________________________  RADIOLOGY Robert Bellow, personally viewed and evaluated these images (plain radiographs) as part of my medical decision making, as well as reviewing the written report by the radiologist.  ED MD interpretation: No pneumonia  Official radiology report(s): DG Chest 2 View  Result Date: 09/20/2020 CLINICAL DATA:  Chest pain EXAM: CHEST - 2 VIEW COMPARISON:  09/12/2020 chest radiograph. FINDINGS: Two lead left subclavian ICD in stable configuration with lead tips overlying the right atrium and right ventricle. Stable  cardiomediastinal silhouette with normal heart size. No pneumothorax. No pleural effusion. Lungs appear clear, with no acute consolidative airspace disease and no pulmonary edema. IMPRESSION: No active cardiopulmonary disease. Electronically Signed   By: Ilona Sorrel M.D.   On: 09/20/2020 07:55    ____________________________________________   PROCEDURES  Procedure(s) performed (including Critical Care):  .1-3 Lead EKG Interpretation Performed by: Vanessa [place], MD Authorized by: Vanessa [place], MD     Interpretation: normal     ECG rate:  90s   ECG rate assessment: normal     Rhythm: sinus rhythm     Ectopy: none     Conduction: normal       ____________________________________________   INITIAL IMPRESSION / ASSESSMENT AND PLAN / ED COURSE   Brittany Nguyen was evaluated in Emergency Department on 09/20/2020 for the symptoms described in the history of present illness. She was evaluated in the context of the global COVID-19 pandemic, which necessitated consideration that the patient might be at risk for infection with the SARS-CoV-2 virus that causes COVID-19. Institutional protocols and algorithms that pertain to the evaluation of patients at risk for COVID-19 are in a state of rapid change based on information released by regulatory bodies including the CDC and federal and state organizations. These policies and algorithms were followed during the patient's care in the ED.    Most Likely DDx:  -Most likely secondary to viral illness given vomiting and diarrhea and husband is sick as well. Will test for covid.  Cardiac markers ordered to r/o ACS although less likely.  We'll keep patient on the cardiac monitor during work-up.  DDx that was also considered d/t potential to cause harm, but was found less likely based on history and physical (as detailed above): -PNA (no fevers, but CXR to evaluate) -PNX (reassured with equal b/l breath sounds, CXR to evaluate) -Symptomatic  anemia (will get H&H) -Pulmonary embolism as  no sob at rest, not pleuritic in nature, no hypoxia -Aortic Dissection as no tearing pain and no radiation to the mid back, pulses equal -Pericarditis no rub on exam, EKG changes or hx to suggest dx -Tamponade (no notable SOB, tachycardic, hypotensive) -Esophageal rupture (no crepitus, no elevated white count, well appearing)  Cardiac marker is negative therefore low suspicion for ACS  Patient is COVID positive. This explains patient's nausea, vomiting, diarrhea.  Reevaluated patient because she continues to have nausea and vomiting.  She is cannot take Zofran or Reglan or Compazine.  We will trial a small dose of Haldol to see if that helps patient.  Patient's has potassium of 2.8.  We'll give 20 of IV potassium.  Magnesium was normal.  Rest of her labs are reassuring.  Chest x-ray without evidence of pneumonia.   10:57 AM reevaluated patient.  Chest pain has resolved.  Her abdominal pain is also resolved.  Her abdomen on repeat examination is soft and nontender.  She states that she is feeling much better. Pt tolerating PO.  At this time she is comfortable with discharge home she just needs to finish her potassium infusion.  We will start him on some oral potassium to take as well. Patient already has Phenergan at home.  She understands quarantine precautions in relation to her COVID.     ____________________________________________   FINAL CLINICAL IMPRESSION(S) / ED DIAGNOSES   Final diagnoses:  COVID-19  Vomiting and diarrhea  Hypokalemia     MEDICATIONS GIVEN DURING THIS VISIT:  Medications  potassium chloride 10 mEq in 100 mL IVPB (10 mEq Intravenous New Bag/Given 09/20/20 1038)  acetaminophen (OFIRMEV) IV 1,000 mg (0 mg Intravenous Stopped 09/20/20 1038)  promethazine (PHENERGAN) injection 12.5 mg (12.5 mg Intravenous Given 09/20/20 0847)  sodium chloride 0.9 % bolus 1,000 mL (1,000 mLs Intravenous New Bag/Given 09/20/20 0931)   ketorolac (TORADOL) 30 MG/ML injection 15 mg (15 mg Intravenous Given 09/20/20 0847)  haloperidol lactate (HALDOL) injection 2 mg (2 mg Intravenous Given 09/20/20 1011)     ED Discharge Orders         Ordered    potassium chloride (KLOR-CON) 10 MEQ tablet  Daily        09/20/20 1059           Note:  This document was prepared using Dragon voice recognition software and may include unintentional dictation errors.   Vanessa [place], MD 09/20/20 1112    Vanessa [place], MD 09/20/20 1159

## 2020-09-20 NOTE — ED Notes (Signed)
Pt called for imaging but no answer in lobby or seen in lobby.

## 2020-09-20 NOTE — ED Triage Notes (Signed)
Pt c/o pain to the center of her chest that radiates into her left shoulder. Pt reports pain is a constant ache but sometimes gets sharp and stabby. Pt also reports some NVD for the past few days.

## 2020-09-20 NOTE — Discharge Instructions (Addendum)
You are positive for COVID-19 which is most likely causing your nausea, vomiting, diarrhea.  Your potassium levels were low at 2.8.  We have given you some potassium by IV.  We have also written you a prescription.  If you cannot take it for the next day while you are vomiting that is okay but try to take it as soon as possible to help with this level.  You should return to the ER if you develop worsening vomiting, abdominal pain, chest pain or any other concerns

## 2020-09-20 NOTE — ED Notes (Signed)
Pt ambulatory to bathroom

## 2020-09-20 NOTE — ED Notes (Signed)
Pt provided with graham crackers and ginger ale.

## 2020-09-20 NOTE — ED Triage Notes (Signed)
Pt in via EMS from home with c/o cp. Pt with hx of MI. Pt also with chills, nausea and diarrhea. Pt reports CP started this am at 3. 98.4, FSBS 155, HR 77, 105/85. IV #18g to right AC, pt has been given fluids by EMS

## 2020-09-21 ENCOUNTER — Telehealth: Payer: Self-pay

## 2020-09-21 NOTE — Telephone Encounter (Signed)
Called to discuss with patient about COVID-19 symptoms and the use of one of the available treatments for those with mild to moderate Covid symptoms and at a high risk of hospitalization.  Pt appears to qualify for outpatient treatment due to co-morbid conditions and/or a member of an at-risk group in accordance with the FDA Emergency Use Authorization.    Symptom onset: Unknown Vaccinated:  x1  Booster? nO Immunocompromised? No Qualifiers: COPD,Asthma,Heart disease  Unable to reach pt - Left message with call back number.  Marcello Moores

## 2020-09-24 ENCOUNTER — Other Ambulatory Visit: Payer: Self-pay

## 2020-09-24 ENCOUNTER — Emergency Department: Payer: Medicare Other

## 2020-09-24 ENCOUNTER — Emergency Department
Admission: EM | Admit: 2020-09-24 | Discharge: 2020-09-24 | Disposition: A | Discharge status: Home / Self Care | Payer: Medicare Other | Attending: Emergency Medicine | Admitting: Emergency Medicine

## 2020-09-24 DIAGNOSIS — M25552 Pain in left hip: Secondary | ICD-10-CM | POA: Insufficient documentation

## 2020-09-24 DIAGNOSIS — I5022 Chronic systolic (congestive) heart failure: Secondary | ICD-10-CM | POA: Diagnosis not present

## 2020-09-24 DIAGNOSIS — Z85028 Personal history of other malignant neoplasm of stomach: Secondary | ICD-10-CM | POA: Diagnosis not present

## 2020-09-24 DIAGNOSIS — E11649 Type 2 diabetes mellitus with hypoglycemia without coma: Secondary | ICD-10-CM | POA: Insufficient documentation

## 2020-09-24 DIAGNOSIS — Z91040 Latex allergy status: Secondary | ICD-10-CM | POA: Diagnosis not present

## 2020-09-24 DIAGNOSIS — J45909 Unspecified asthma, uncomplicated: Secondary | ICD-10-CM | POA: Insufficient documentation

## 2020-09-24 DIAGNOSIS — M25522 Pain in left elbow: Secondary | ICD-10-CM | POA: Diagnosis not present

## 2020-09-24 DIAGNOSIS — J449 Chronic obstructive pulmonary disease, unspecified: Secondary | ICD-10-CM | POA: Insufficient documentation

## 2020-09-24 DIAGNOSIS — E039 Hypothyroidism, unspecified: Secondary | ICD-10-CM | POA: Insufficient documentation

## 2020-09-24 DIAGNOSIS — Z79899 Other long term (current) drug therapy: Secondary | ICD-10-CM | POA: Insufficient documentation

## 2020-09-24 DIAGNOSIS — W010XXA Fall on same level from slipping, tripping and stumbling without subsequent striking against object, initial encounter: Secondary | ICD-10-CM | POA: Diagnosis not present

## 2020-09-24 DIAGNOSIS — M25512 Pain in left shoulder: Secondary | ICD-10-CM | POA: Insufficient documentation

## 2020-09-24 DIAGNOSIS — W19XXXA Unspecified fall, initial encounter: Secondary | ICD-10-CM

## 2020-09-24 MED ORDER — MELOXICAM 15 MG PO TABS: 15.0000 mg | ORAL_TABLET | Freq: Every day | ORAL | 2 refills | Status: DC

## 2020-09-24 NOTE — ED Triage Notes (Signed)
Pt states she was outside and slipped on some ice at 530pm. Pt states falling on her left side and is complaining of left elbow pain, shoulder pain and left hip pain. Pt denies hitting head and denies LOC.  Pt states swelling to the left hand and is unable to get her rings off.  Pt states she is a cancer pt, with tumors in her stomach.   RN was able to get rings off pts left hand and they were given to pt to hold onto.

## 2020-09-24 NOTE — ED Provider Notes (Signed)
[facility]EMERGENCY DEPARTMENT  ____________________________________________  Time seen: Approximately 8:28 PM  I have reviewed the triage vital signs and the nursing notes.   HISTORY  Chief Complaint Fall   Historian Patient     HPI Brittany Nguyen is a 55 y.o. female presents to the emergency department after patient took a mechanical fall tonight.  Patient slipped on some ice and fell onto her left arm.  She is complaining of left shoulder pain, left elbow pain and left hip pain.  She denies chest pain, chest tightness or abdominal pain.  She has been able to ambulate since fall occurred.  No abrasions or lacerations.  No other alleviating measures have been attempted.   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    CHF (congestive heart failure) ([place])    COPD (chronic obstructive pulmonary disease) ([place])    Diverticulitis 2010   DVT (deep venous thrombosis) ([place])    Endometriosis 1990   Heart disease 2013   Hx MRSA infection    Iron deficiency    Lump or mass in breast    Restless leg    Seizures ([place])    Stomach cancer [facility])     Patient Active Problem List   Diagnosis Date Noted   Non-ischemic cardiomyopathy ([place]) 01/23/2020   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/23/2020   Seizure ([place]) 01/23/2020   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 difficile colitis 01/14/2018   GIB (gastrointestinal bleeding) 01/08/2018   SOB (shortness of breath) 11/01/2017   Influenza A 10/29/2017   Acute respiratory failure ([place]) 09/24/2017   Sepsis ([place]) 07/03/2017   COPD (chronic obstructive pulmonary disease) ([place]) 04/26/2017   Community acquired pneumonia 57/84/6962   Chronic systolic heart failure ([place]) 02/03/2017   Hypotension 02/03/2017   Chest pain 12/09/2016   RLS (restless legs syndrome) 12/09/2016   Cough, persistent 11/22/2015   Hypokalemia 11/22/2015   Leukocytosis 11/22/2015   Dyspnea 11/21/2015   Respiratory distress 09/19/2015   Breast pain 01/11/2013   Hx MRSA infection    Lump or mass in breast    GOITER, MULTINODULAR 01/20/2009   HYPOGLYCEMIA, UNSPECIFIED 01/20/2009   GERD 01/20/2009   DIVERTICULITIS OF COLON 01/20/2009    Past Surgical History:  Procedure Laterality Date   ABDOMINAL HYSTERECTOMY     age 24   BREAST BIOPSY Right 2014   benign   CARDIAC DEFIBRILLATOR [place]  CHOLECYSTECTOMY     COLECTOMY     COLONOSCOPY WITH PROPOFOL N/A 02/22/2018   Procedure: COLONOSCOPY WITH PROPOFOL;  Surgeon: Toledo, Benay Pike, MD;  Location: [facility] ENDOSCOPY;  Service: Gastroenterology;  Laterality: N/A;   ESOPHAGOGASTRODUODENOSCOPY (EGD) WITH PROPOFOL N/A 02/22/2018   Procedure: ESOPHAGOGASTRODUODENOSCOPY (EGD) WITH PROPOFOL;  Surgeon: Toledo, Benay Pike, MD;  Location: [facility] ENDOSCOPY;  Service: Gastroenterology;  Laterality: N/A;   NASAL SINUS SURGERY  2012   OOPHORECTOMY      Prior to Admission  medications   Medication Sig Start Date End Date Taking? Authorizing Provider  meloxicam (MOBIC) 15 MG tablet Take 1 tablet (15 mg total) by mouth daily. 09/24/20 09/24/21 Yes Vallarie Mare M, PA-C  albuterol (ACCUNEB) 1.25 MG/3ML nebulizer solution Take 3 mLs by nebulization every 6 (six) hours as needed for wheezing. 11/09/17   [provider]  albuterol (VENTOLIN HFA) 108 (90 Base) MCG/ACT inhaler Inhale 2 puffs into the lungs every 6 (six) hours as needed for shortness of breath. 08/28/16   [provider]  beclomethasone (QVAR) 80 MCG/ACT inhaler Inhale 1 puff into the lungs 2 (two) times daily. 06/12/17   [provider]  busPIRone (BUSPAR) 15 MG tablet Take 15 mg by mouth 2 (two) times daily as needed.  05/08/18   [provider]  DULoxetine (CYMBALTA) 30 MG capsule Take 30 mg by mouth daily. 10/27/19   [provider]  ENTRESTO 24-26 MG Take 0.5 tablets by mouth 2 (two) times daily. 01/08/20   [provider]  famotidine (PEPCID) 20 MG tablet Take 1 tablet (20 mg total) by mouth 2 (two) times daily. 03/15/19   Carrie Mew, MD  FARXIGA 5 MG TABS tablet Take 5 mg by mouth daily. 11/11/19   [provider]  fentaNYL (DURAGESIC) 50 MCG/HR Place 1 patch onto the skin every 3 (three) days. 10/08/18   Epifanio Lesches, MD  furosemide (LASIX) 40 MG tablet Take 80 mg by mouth 2 (two) times daily.  07/09/18   [provider]  hydrOXYzine (ATARAX/VISTARIL) 25 MG tablet Take 25 mg by mouth every 6 (six) hours as needed for itching. 04/13/19   [provider]  levothyroxine (SYNTHROID) 175 MCG tablet Take 175 mcg by mouth daily before breakfast.     [provider]  LORazepam (ATIVAN) 1 MG tablet Take 1 mg by mouth 4 (four) times daily as needed. 12/20/19   [provider]  losartan (COZAAR) 25 MG tablet Take 25 mg by mouth daily.     [provider]  metoCLOPramide (REGLAN) 10 MG tablet Take 1 tablet (10  mg total) by mouth every 8 (eight) hours as needed for up to 3 days for nausea. 02/11/20 02/14/20  Rudene Re, MD  metoprolol succinate (TOPROL-XL) 50 MG 24 hr tablet Take 50 mg by mouth 2 (two) times daily. 01/25/19   [provider]  mirtazapine (REMERON) 15 MG tablet Take 15 mg by mouth at bedtime. 04/26/19 05/26/24  [provider]  morphine (MSIR) 15 MG tablet Take 15 mg by mouth as needed for severe pain.     [provider]  Multiple Vitamins-Minerals (ONE-A-DAY WOMENS PO) Take 1 tablet by mouth daily.    [provider]  nitroGLYCERIN (NITROSTAT) 0.4 MG SL tablet Place 1 tablet (0.4 mg total) under the tongue every 5 (five) minutes as needed for chest pain. 01/24/17   Henreitta Leber, MD  omeprazole (PRILOSEC) 40 MG capsule Take 40 mg by mouth 2 (two) times daily. 10/19/18  [provider]  oxyCODONE (OXY IR/ROXICODONE) 5 MG immediate release tablet Take 5-10 mg by mouth every 4 (four) hours as needed for pain. 04/11/19   [provider]  oxyCODONE-acetaminophen (PERCOCET) 5-325 MG tablet Take 1 tablet by mouth every 4 (four) hours as needed. 02/11/20   Rudene Re, MD  oxyCODONE-acetaminophen (PERCOCET/ROXICET) 5-325 MG tablet Take 1 tablet by mouth every 4 (four) hours as needed.  01/17/20   [provider]  Pancrelipase, Lip-Prot-Amyl, (CREON) 24000-76000 units CPEP Take 3-4 capsules by mouth 3 (three) times daily with meals. 04/01/19   [provider]  pantoprazole (PROTONIX) 40 MG tablet Take 40 mg by mouth 2 (two) times daily. 12/20/19   [provider]  potassium chloride (KLOR-CON) 10 MEQ tablet Take 2 tablets (20 mEq total) by mouth daily for 5 days. 09/20/20 09/25/20  Vanessa [place], MD  potassium chloride SA (KLOR-CON M20) 20 MEQ tablet Take 1 tablet (20 mEq total) by mouth 2 (two) times daily for 7 days. 07/09/19 01/23/20  Carrie Mew, MD  promethazine (PHENERGAN) 25 MG tablet Take 1 tablet (25 mg  total) by mouth every 6 (six) hours as needed. 07/09/19   Carrie Mew, MD  rOPINIRole (REQUIP) 4 MG tablet Take 4 mg by mouth at bedtime. 01/30/19   [provider]  spironolactone (ALDACTONE) 50 MG tablet Take 50 mg by mouth daily. 11/26/18   [provider]  sucralfate (CARAFATE) 1 g tablet Take 1 tablet (1 g total) by mouth 4 (four) times daily. 03/15/19   Carrie Mew, MD  vitamin B-12 (CYANOCOBALAMIN) 1000 MCG tablet Take 1,000 mcg by mouth daily.    [provider]    Allergies Contrast media [iodinated diagnostic agents], Lidocaine, Metrizamide, Penicillins, Isosorbide nitrate, Latex, Ondansetron, Zofran [ondansetron hcl], Povidone-iodine, and Pulmicort [budesonide]  Family History  Problem Relation Age of Onset   Cancer Mother 17       ovarian   CAD Mother    Cancer Father 60       brain   CAD Father    Cancer Daughter 37       skin   Cancer Maternal Aunt 74       breast   Leukemia Paternal Grandfather     Social History Social History   Tobacco Use   Smoking status: Never Smoker   Smokeless tobacco: Never Used  Scientific laboratory technician Use: Never used  Substance Use Topics   Alcohol use: No   Drug use: No     Review of Systems  Constitutional: No fever/chills Eyes:  No discharge ENT: No upper respiratory complaints. Respiratory: no cough. No SOB/ use of accessory muscles to breath Gastrointestinal:   No nausea, no vomiting.  No diarrhea.  No constipation. Musculoskeletal: Patient has left shoulder pain, left elbow pain and left hip pain.  Skin: Negative for rash, abrasions, lacerations, ecchymosis.    ____________________________________________   PHYSICAL EXAM:  VITAL SIGNS: ED Triage Vitals  Enc Vitals Group     BP 09/24/20 1905 96/65     Pulse Rate 09/24/20 1905 (!) 106     Resp 09/24/20 1905 20     Temp 09/24/20 1905 98.7 F (37.1 C)     Temp src --      SpO2 09/24/20 1905 96 %     Weight 09/24/20  1907 190 lb (86.2 kg)     Height 09/24/20 1907 5\' 5"  (1.651 m)     Head Circumference --  Peak Flow --      Pain Score 09/24/20 1906 9     Pain Loc --      Pain Edu? --      Excl. in [place]? --      Constitutional: Alert and oriented. Well appearing and in no acute distress. Eyes: Conjunctivae are normal. PERRL. EOMI. Head: Atraumatic. ENT:      Nose: No congestion/rhinnorhea.      Mouth/Throat: Mucous membranes are moist.  Neck: No stridor.  FROM.  No midline cervical spine tenderness to palpation. Cardiovascular: Normal rate, regular rhythm. Normal S1 and S2.  Good peripheral circulation. Respiratory: Normal respiratory effort without tachypnea or retractions. Lungs CTAB. Good air entry to the bases with no decreased or absent breath sounds Gastrointestinal: Bowel sounds x 4 quadrants. Soft and nontender to palpation. No guarding or rigidity. No distention. Musculoskeletal: Full range of motion to all extremities. No obvious deformities noted Neurologic:  Normal for age. No gross focal neurologic deficits are appreciated.  Skin:  Skin is warm, dry and intact. No rash noted. Psychiatric: Mood and affect are normal for age. Speech and behavior are normal.   ____________________________________________   LABS (all labs ordered are listed, but only abnormal results are displayed)  Labs Reviewed - No data to display ____________________________________________  EKG   ____________________________________________  RADIOLOGY Unk Pinto, personally viewed and evaluated these images (plain radiographs) as part of my medical decision making, as well as reviewing the written report by the radiologist.  DG Elbow 2 Views Left  Result Date: 09/24/2020 CLINICAL DATA:  Status post fall. EXAM: LEFT ELBOW - 2 VIEW COMPARISON:  None. FINDINGS: There is no evidence of fracture, dislocation, or joint effusion. A very small bony spur is seen along the olecranon process. Soft tissues are  unremarkable. IMPRESSION: No acute osseous abnormality. Electronically Signed   By: Virgina Norfolk M.D.   On: 09/24/2020 19:54   DG Shoulder Left  Result Date: 09/24/2020 CLINICAL DATA:  Status post fall. EXAM: LEFT SHOULDER - 2+ VIEW COMPARISON:  None. FINDINGS: There is no evidence of fracture or dislocation. There is no evidence of arthropathy or other focal bone abnormality. A multi lead AICD is noted. IMPRESSION: Negative. Electronically Signed   By: Virgina Norfolk M.D.   On: 09/24/2020 19:56   DG Hip Unilat With Pelvis 2-3 Views Left  Result Date: 09/24/2020 CLINICAL DATA:  Status post fall. EXAM: DG HIP (WITH OR WITHOUT PELVIS) 2-3V LEFT COMPARISON:  None. FINDINGS: There is no evidence of hip fracture or dislocation. There is no evidence of arthropathy or other focal bone abnormality. Surgical clips are seen overlying the sacrum. Multiple small phleboliths are seen within the lower pelvis. IMPRESSION: No acute osseous abnormalities. Electronically Signed   By: Virgina Norfolk M.D.   On: 09/24/2020 19:53    ____________________________________________    PROCEDURES  Procedure(s) performed:     Procedures     Medications - No data to display   ____________________________________________   INITIAL IMPRESSION / ASSESSMENT AND PLAN / ED COURSE  Pertinent labs & imaging results that were available during my care of the patient were reviewed by me and considered in my medical decision making (see chart for details).      Assessment and plan Fall 42-year-old female presents to the emergency department after a mechanical, nonsyncopal fall complaining of left shoulder pain, left elbow pain and left hip pain.  Patient was mildly tachycardic at triage but vital signs were otherwise reassuring.  X-rays of the left shoulder, left elbow and left hip revealed no bony abnormality.  Patient was discharged with meloxicam and a sling was provided for comfort.  All patient  questions were answered.     ____________________________________________  FINAL CLINICAL IMPRESSION(S) / ED DIAGNOSES  Final diagnoses:  Fall, initial encounter      NEW MEDICATIONS STARTED DURING THIS VISIT:  ED Discharge Orders         Ordered    meloxicam (MOBIC) 15 MG tablet  Daily        09/24/20 2022              This chart was dictated using voice recognition software/Dragon. Despite best efforts to proofread, errors can occur which can change the meaning. Any change was purely unintentional.     Karren Cobble 09/24/20 2139    Lucrezia Starch, MD 09/24/20 2256

## 2020-09-24 NOTE — Discharge Instructions (Addendum)
You can take meloxicam as needed for pain and inflammation.

## 2020-10-28 DIAGNOSIS — C7A8 Other malignant neuroendocrine tumors: Secondary | ICD-10-CM | POA: Insufficient documentation

## 2020-11-23 ENCOUNTER — Other Ambulatory Visit: Payer: Self-pay

## 2020-11-23 ENCOUNTER — Emergency Department
Admission: EM | Admit: 2020-11-23 | Discharge: 2020-11-23 | Disposition: A | Discharge status: Home / Self Care | Payer: Medicare Other | Attending: Emergency Medicine | Admitting: Emergency Medicine

## 2020-11-23 DIAGNOSIS — I5022 Chronic systolic (congestive) heart failure: Secondary | ICD-10-CM | POA: Insufficient documentation

## 2020-11-23 DIAGNOSIS — R112 Nausea with vomiting, unspecified: Secondary | ICD-10-CM

## 2020-11-23 DIAGNOSIS — R197 Diarrhea, unspecified: Secondary | ICD-10-CM | POA: Insufficient documentation

## 2020-11-23 DIAGNOSIS — Z91040 Latex allergy status: Secondary | ICD-10-CM | POA: Insufficient documentation

## 2020-11-23 DIAGNOSIS — Z85028 Personal history of other malignant neoplasm of stomach: Secondary | ICD-10-CM | POA: Diagnosis not present

## 2020-11-23 DIAGNOSIS — J449 Chronic obstructive pulmonary disease, unspecified: Secondary | ICD-10-CM | POA: Diagnosis not present

## 2020-11-23 DIAGNOSIS — E119 Type 2 diabetes mellitus without complications: Secondary | ICD-10-CM | POA: Diagnosis not present

## 2020-11-23 DIAGNOSIS — R109 Unspecified abdominal pain: Secondary | ICD-10-CM | POA: Insufficient documentation

## 2020-11-23 DIAGNOSIS — Z7951 Long term (current) use of inhaled steroids: Secondary | ICD-10-CM | POA: Insufficient documentation

## 2020-11-23 DIAGNOSIS — J45909 Unspecified asthma, uncomplicated: Secondary | ICD-10-CM | POA: Diagnosis not present

## 2020-11-23 LAB — COMPREHENSIVE METABOLIC PANEL
ALT: 17 U/L (ref 0–44)
AST: 21 U/L (ref 15–41)
Albumin: 4 g/dL (ref 3.5–5.0)
Alkaline Phosphatase: 104 U/L (ref 38–126)
Anion gap: 9 (ref 5–15)
BUN: 12 mg/dL (ref 6–20)
CO2: 30 mmol/L (ref 22–32)
Calcium: 9 mg/dL (ref 8.9–10.3)
Chloride: 96 mmol/L — ABNORMAL LOW (ref 98–111)
Creatinine, Ser: 0.93 mg/dL (ref 0.44–1.00)
GFR, Estimated: >60 mL/min (ref >60)
Glucose, Bld: 105 mg/dL — ABNORMAL HIGH (ref 70–99)
Potassium: 3.2 mmol/L — ABNORMAL LOW (ref 3.5–5.1)
Sodium: 135 mmol/L (ref 135–145)
Total Bilirubin: 0.7 mg/dL (ref 0.3–1.2)
Total Protein: 7.6 g/dL (ref 6.5–8.1)

## 2020-11-23 LAB — CBC
HCT: 39.9 % (ref 36.0–46.0)
Hemoglobin: 13.8 g/dL (ref 12.0–15.0)
MCH: 29.4 pg (ref 26.0–34.0)
MCHC: 34.6 g/dL (ref 30.0–36.0)
MCV: 85.1 fL (ref 80.0–100.0)
Platelets: 241 10*3/uL (ref 150–400)
RBC: 4.69 MIL/uL (ref 3.87–5.11)
RDW: 12.6 % (ref 11.5–15.5)
WBC: 9.1 10*3/uL (ref 4.0–10.5)
nRBC: 0 % (ref 0.0–0.2)

## 2020-11-23 LAB — LIPASE, BLOOD: Lipase: 33 U/L (ref 11–51)

## 2020-11-23 MED ORDER — SODIUM CHLORIDE 0.9 % IV SOLN
1000.0000 mL | Freq: Once | INTRAVENOUS | Status: AC
  Administered 2020-11-23: 1000 mL via INTRAVENOUS

## 2020-11-23 MED ORDER — SODIUM CHLORIDE 0.9 % IV SOLN
12.5000 mg | Freq: Once | INTRAVENOUS | Status: AC
  Administered 2020-11-23: 12.5 mg via INTRAVENOUS
  Filled 2020-11-23: qty 0.5

## 2020-11-23 MED ORDER — FENTANYL CITRATE (PF) 100 MCG/2ML IJ SOLN
50.0000 ug | Freq: Once | INTRAMUSCULAR | Status: AC
  Administered 2020-11-23: 50 ug via INTRAVENOUS
  Filled 2020-11-23: qty 2

## 2020-11-23 NOTE — ED Notes (Signed)
Pt to ED via POV, states had injection for stomach cancer several days ago at [facility] center. States on Saturday began having N/V/D, states has had 15 episodes of vomiting and diarrhea in the last 24 hrs. Pt noted to be speaking with staff appropriately, upon MD arrival pt noted to become tearful. Pt A&O x4. Pt noted to be hypotensive in room, IVF initiated by this RN at this time. Will speak with EDP regarding pain medication and patient's BP at this time.

## 2020-11-23 NOTE — ED Triage Notes (Signed)
Pt c/o abd pain with N/V/D since Saturday with a hx of abd cancer and currently gets monthly injections at [facility] for it. Pt is pale in color, moaning in pain on arrival

## 2020-11-23 NOTE — ED Provider Notes (Signed)
[facility] Emergency Department Provider Note   ____________________________________________    I have reviewed the triage vital signs and the nursing notes.   HISTORY  Chief Complaint Abdominal Pain, Emesis, and Diarrhea     HPI Brittany Nguyen is a 55 y.o. female with history of CHF, COPD, carcinoid tumor of the stomach who presents with complaints of nausea vomiting and diarrhea.  Patient reports she frequently has nausea and vomiting and diarrhea however here recently it feels like she may have a GI bug because her daughter has similar symptoms.  She is followed with [facility].  Did take some Phenergan at home with little improvement.  Does take analgesics at home to.  Describes diffuse abdominal cramping, watery stools, frequent vomiting over the last 2 to 3 days  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    CHF (congestive heart failure) ([place])    COPD (chronic obstructive pulmonary disease) ([place])    Diverticulitis 2010   DVT (deep venous thrombosis) ([place])    Endometriosis 1990   Heart disease 2013   Hx MRSA infection    Iron deficiency    Lump or mass in breast    Restless leg    Seizures ([place])    Stomach cancer [facility])     Patient Active Problem List   Diagnosis Date Noted   Non-ischemic cardiomyopathy ([place]) 01/23/2020   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/23/2020   Seizure ([place]) 01/23/2020   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 difficile colitis 01/14/2018   GIB  (gastrointestinal bleeding) 01/08/2018   SOB (shortness of breath) 11/01/2017   Influenza A 10/29/2017   Acute respiratory failure ([place]) 09/24/2017   Sepsis ([place]) 07/03/2017   COPD (chronic obstructive pulmonary disease) ([place]) 04/26/2017   Community acquired pneumonia 54/05/8118   Chronic systolic heart failure ([place]) 02/03/2017   Hypotension 02/03/2017   Chest pain 12/09/2016   RLS (restless legs syndrome) 12/09/2016   Cough, persistent 11/22/2015   Hypokalemia 11/22/2015   Leukocytosis 11/22/2015   Dyspnea 11/21/2015   Respiratory distress 09/19/2015   Breast pain 01/11/2013   Hx MRSA infection    Lump or mass in breast    GOITER, MULTINODULAR 01/20/2009   HYPOGLYCEMIA, UNSPECIFIED 01/20/2009   GERD 01/20/2009   DIVERTICULITIS OF COLON 01/20/2009    Past Surgical History:  Procedure Laterality Date   ABDOMINAL HYSTERECTOMY     age 71   BREAST BIOPSY Right 2014   benign   CARDIAC DEFIBRILLATOR PLACEMENT     CARDIAC DEFIBRILLATOR PLACEMENT     CESAREAN SECTION     CHOLECYSTECTOMY     COLECTOMY     COLONOSCOPY WITH PROPOFOL N/A 02/22/2018   Procedure: COLONOSCOPY WITH PROPOFOL;  Surgeon: Toledo, Benay Pike, MD;  Location: [facility] ENDOSCOPY;  Service: Gastroenterology;  Laterality: N/A;   ESOPHAGOGASTRODUODENOSCOPY (EGD) WITH PROPOFOL N/A 02/22/2018   Procedure: ESOPHAGOGASTRODUODENOSCOPY (EGD) WITH PROPOFOL;  Surgeon: Toledo, Benay Pike, MD;  Location: [facility] ENDOSCOPY;  Service: Gastroenterology;  Laterality: N/A;   NASAL SINUS SURGERY  2012   OOPHORECTOMY      Prior  to Admission medications   Medication Sig Start Date End Date Taking? Authorizing Provider  albuterol (ACCUNEB) 1.25 MG/3ML nebulizer solution Take 3 mLs by nebulization every 6 (six) hours as needed for wheezing. 11/09/17   [provider]  albuterol (VENTOLIN HFA) 108 (90 Base) MCG/ACT inhaler Inhale 2 puffs into the lungs every 6 (six) hours as needed for shortness of  breath. 08/28/16   [provider]  beclomethasone (QVAR) 80 MCG/ACT inhaler Inhale 1 puff into the lungs 2 (two) times daily. 06/12/17   [provider]  busPIRone (BUSPAR) 15 MG tablet Take 15 mg by mouth 2 (two) times daily as needed.  05/08/18   [provider]  DULoxetine (CYMBALTA) 30 MG capsule Take 30 mg by mouth daily. 10/27/19   [provider]  ENTRESTO 24-26 MG Take 0.5 tablets by mouth 2 (two) times daily. 01/08/20   [provider]  famotidine (PEPCID) 20 MG tablet Take 1 tablet (20 mg total) by mouth 2 (two) times daily. 03/15/19   Carrie Mew, MD  FARXIGA 5 MG TABS tablet Take 5 mg by mouth daily. 11/11/19   [provider]  fentaNYL (DURAGESIC) 50 MCG/HR Place 1 patch onto the skin every 3 (three) days. 10/08/18   Epifanio Lesches, MD  furosemide (LASIX) 40 MG tablet Take 80 mg by mouth 2 (two) times daily.  07/09/18   [provider]  hydrOXYzine (ATARAX/VISTARIL) 25 MG tablet Take 25 mg by mouth every 6 (six) hours as needed for itching. 04/13/19   [provider]  levothyroxine (SYNTHROID) 175 MCG tablet Take 175 mcg by mouth daily before breakfast.     [provider]  LORazepam (ATIVAN) 1 MG tablet Take 1 mg by mouth 4 (four) times daily as needed. 12/20/19   [provider]  losartan (COZAAR) 25 MG tablet Take 25 mg by mouth daily.     [provider]  meloxicam (MOBIC) 15 MG tablet Take 1 tablet (15 mg total) by mouth daily. 09/24/20 09/24/21  Lannie Fields, PA-C  metoCLOPramide (REGLAN) 10 MG tablet Take 1 tablet (10 mg total) by mouth every 8 (eight) hours as needed for up to 3 days for nausea. 02/11/20 02/14/20  Rudene Re, MD  metoprolol succinate (TOPROL-XL) 50 MG 24 hr tablet Take 50 mg by mouth 2 (two) times daily. 01/25/19   [provider]  mirtazapine (REMERON) 15 MG tablet Take 15 mg by mouth at bedtime. 04/26/19 05/26/24  [provider]  morphine  (MSIR) 15 MG tablet Take 15 mg by mouth as needed for severe pain.     [provider]  Multiple Vitamins-Minerals (ONE-A-DAY WOMENS PO) Take 1 tablet by mouth daily.    [provider]  nitroGLYCERIN (NITROSTAT) 0.4 MG SL tablet Place 1 tablet (0.4 mg total) under the tongue every 5 (five) minutes as needed for chest pain. 01/24/17   Henreitta Leber, MD  omeprazole (PRILOSEC) 40 MG capsule Take 40 mg by mouth 2 (two) times daily. 10/19/18   [provider]  oxyCODONE (OXY IR/ROXICODONE) 5 MG immediate release tablet Take 5-10 mg by mouth every 4 (four) hours as needed for pain. 04/11/19   [provider]  oxyCODONE-acetaminophen (PERCOCET) 5-325 MG tablet Take 1 tablet by mouth every 4 (four) hours as needed. 02/11/20   Rudene Re, MD  oxyCODONE-acetaminophen (PERCOCET/ROXICET) 5-325 MG tablet Take 1 tablet by mouth every 4 (four) hours as needed.  01/17/20   [provider]  Pancrelipase, Lip-Prot-Amyl, ([place])  24000-76000 units CPEP Take 3-4 capsules by mouth 3 (three) times daily with meals. 04/01/19   [provider]  pantoprazole (PROTONIX) 40 MG tablet Take 40 mg by mouth 2 (two) times daily. 12/20/19   [provider]  potassium chloride (KLOR-CON) 10 MEQ tablet Take 2 tablets (20 mEq total) by mouth daily for 5 days. 09/20/20 09/25/20  Vanessa Cadott, MD  potassium chloride SA (KLOR-CON M20) 20 MEQ tablet Take 1 tablet (20 mEq total) by mouth 2 (two) times daily for 7 days. 07/09/19 01/23/20  Carrie Mew, MD  promethazine (PHENERGAN) 25 MG tablet Take 1 tablet (25 mg total) by mouth every 6 (six) hours as needed. 07/09/19   Carrie Mew, MD  rOPINIRole (REQUIP) 4 MG tablet Take 4 mg by mouth at bedtime. 01/30/19   [provider]  spironolactone (ALDACTONE) 50 MG tablet Take 50 mg by mouth daily. 11/26/18   [provider]  sucralfate (CARAFATE) 1 g tablet Take 1 tablet (1 g total) by mouth 4 (four) times  daily. 03/15/19   Carrie Mew, MD  vitamin B-12 (CYANOCOBALAMIN) 1000 MCG tablet Take 1,000 mcg by mouth daily.    [provider]     Allergies Contrast media [iodinated diagnostic agents], Lidocaine, Metrizamide, Penicillins, Isosorbide nitrate, Latex, Ondansetron, Zofran [ondansetron hcl], Povidone-iodine, and Pulmicort [budesonide]  Family History  Problem Relation Age of Onset   Cancer Mother 47       ovarian   CAD Mother    Cancer Father 93       brain   CAD Father    Cancer Daughter 28       skin   Cancer Maternal Aunt 48       breast   Leukemia Paternal Grandfather     Social History Social History   Tobacco Use   Smoking status: Never Smoker   Smokeless tobacco: Never Used  Scientific laboratory technician Use: Never used  Substance Use Topics   Alcohol use: No   Drug use: No    Review of Systems  Constitutional: No fever/chills Eyes: No visual changes.  ENT: No sore throat. Cardiovascular: Denies chest pain. Respiratory: Denies shortness of breath. Gastrointestinal: As above Genitourinary: Negative for dysuria. Musculoskeletal: Negative for back pain. Skin: Negative for rash. Neurological: Negative for headaches    ____________________________________________   PHYSICAL EXAM:  VITAL SIGNS: ED Triage Vitals  Enc Vitals Group     BP 11/23/20 1037 (!) 87/61     Pulse Rate 11/23/20 1037 91     Resp 11/23/20 1037 20     Temp 11/23/20 1037 98.3 F (36.8 C)     Temp Source 11/23/20 1037 Oral     SpO2 11/23/20 1037 96 %     Weight 11/23/20 1038 88.5 kg (195 lb)     Height 11/23/20 1038 1.651 m (5\' 5" )     Head Circumference --      Peak Flow --      Pain Score 11/23/20 1038 10     Pain Loc --      Pain Edu? --      Excl. in [place]? --     Constitutional: Alert and oriented.   Nose: No congestion/rhinnorhea. Mouth/Throat: Mucous membranes are dry Neck:  Painless ROM Cardiovascular: Normal rate, regular rhythm. Grossly normal  heart sounds.  Good peripheral circulation. Respiratory: Normal respiratory effort.  No retractions. Lungs CTAB. Gastrointestinal: Soft and nontender. No distention.  No CVA tenderness.  Overall reassuring exam  Musculoskeletal: No lower extremity tenderness nor edema.  Warm and well perfused Neurologic:  Normal speech and language. No gross focal neurologic deficits are appreciated.  Skin:  Skin is warm, dry and intact. No rash noted. Psychiatric: Mood and affect are normal. Speech and behavior are normal.  ____________________________________________   LABS (all labs ordered are listed, but only abnormal results are displayed)  Labs Reviewed  COMPREHENSIVE METABOLIC PANEL - Abnormal; Notable for the following components:      Result Value   Potassium 3.2 (*)    Chloride 96 (*)    Glucose, Bld 105 (*)    All other components within normal limits  LIPASE, BLOOD  CBC  URINALYSIS, COMPLETE (UACMP) WITH MICROSCOPIC   ____________________________________________  EKG   ____________________________________________  RADIOLOGY  None ____________________________________________   PROCEDURES  Procedure(s) performed: No  Procedures   Critical Care performed: No ____________________________________________   INITIAL IMPRESSION / ASSESSMENT AND PLAN / ED COURSE  Pertinent labs & imaging results that were available during my care of the patient were reviewed by me and considered in my medical decision making (see chart for details).  Patient presents with nausea vomiting diarrhea abdominal cramping as above.  Symptoms suspicious for viral gastroenteritis given prevalence in the community at this time and daughter with similar symptoms.  Could also be related to her ongoing symptoms related to her gastric CA.  Abdominal exam is reassuring, no distention, no significant tenderness  Lab work unremarkable.  Lipase normal.  Patient treated with IV fluid bolus x2, IV fentanyl,  IV Phenergan with significant improvement  ----------------------------------------- 2:16 PM on 11/23/2020 -----------------------------------------  Patient feeling much better, appropriate for discharge at this time, she feels quite comfortable with discharge, she will return if any worsening, outpatient follow-up with PCP.    ____________________________________________   FINAL CLINICAL IMPRESSION(S) / ED DIAGNOSES  Final diagnoses:  Nausea vomiting and diarrhea        Note:  This document was prepared using Dragon voice recognition software and may include unintentional dictation errors.   Lavonia Drafts, MD 11/23/20 1416

## 2020-12-06 ENCOUNTER — Emergency Department
Admission: EM | Admit: 2020-12-06 | Discharge: 2020-12-06 | Disposition: A | Discharge status: Home / Self Care | Payer: Medicare Other | Attending: Emergency Medicine | Admitting: Emergency Medicine

## 2020-12-06 ENCOUNTER — Other Ambulatory Visit: Payer: Self-pay

## 2020-12-06 ENCOUNTER — Emergency Department: Payer: Medicare Other

## 2020-12-06 DIAGNOSIS — Z20822 Contact with and (suspected) exposure to covid-19: Secondary | ICD-10-CM | POA: Diagnosis not present

## 2020-12-06 DIAGNOSIS — K068 Other specified disorders of gingiva and edentulous alveolar ridge: Secondary | ICD-10-CM | POA: Diagnosis not present

## 2020-12-06 DIAGNOSIS — Z95810 Presence of automatic (implantable) cardiac defibrillator: Secondary | ICD-10-CM | POA: Insufficient documentation

## 2020-12-06 DIAGNOSIS — Z79899 Other long term (current) drug therapy: Secondary | ICD-10-CM | POA: Diagnosis not present

## 2020-12-06 DIAGNOSIS — E890 Postprocedural hypothyroidism: Secondary | ICD-10-CM | POA: Diagnosis not present

## 2020-12-06 DIAGNOSIS — B349 Viral infection, unspecified: Secondary | ICD-10-CM

## 2020-12-06 DIAGNOSIS — Z85028 Personal history of other malignant neoplasm of stomach: Secondary | ICD-10-CM | POA: Insufficient documentation

## 2020-12-06 DIAGNOSIS — Z7984 Long term (current) use of oral hypoglycemic drugs: Secondary | ICD-10-CM | POA: Insufficient documentation

## 2020-12-06 DIAGNOSIS — I5022 Chronic systolic (congestive) heart failure: Secondary | ICD-10-CM | POA: Insufficient documentation

## 2020-12-06 DIAGNOSIS — J45909 Unspecified asthma, uncomplicated: Secondary | ICD-10-CM | POA: Diagnosis not present

## 2020-12-06 DIAGNOSIS — J449 Chronic obstructive pulmonary disease, unspecified: Secondary | ICD-10-CM | POA: Diagnosis not present

## 2020-12-06 DIAGNOSIS — Z91040 Latex allergy status: Secondary | ICD-10-CM | POA: Insufficient documentation

## 2020-12-06 DIAGNOSIS — R0602 Shortness of breath: Secondary | ICD-10-CM | POA: Diagnosis present

## 2020-12-06 LAB — BASIC METABOLIC PANEL
Anion gap: 9 (ref 5–15)
BUN: 8 mg/dL (ref 6–20)
CO2: 28 mmol/L (ref 22–32)
Calcium: 8.7 mg/dL — ABNORMAL LOW (ref 8.9–10.3)
Chloride: 101 mmol/L (ref 98–111)
Creatinine, Ser: 0.92 mg/dL (ref 0.44–1.00)
GFR, Estimated: >60 mL/min (ref >60)
Glucose, Bld: 108 mg/dL — ABNORMAL HIGH (ref 70–99)
Potassium: 3.5 mmol/L (ref 3.5–5.1)
Sodium: 138 mmol/L (ref 135–145)

## 2020-12-06 LAB — RESP PANEL BY RT-PCR (FLU A&B, COVID) ARPGX2
Influenza A by PCR: NEGATIVE
Influenza B by PCR: NEGATIVE
SARS Coronavirus 2 by RT PCR: NEGATIVE

## 2020-12-06 LAB — CBC
HCT: 40.8 % (ref 36.0–46.0)
Hemoglobin: 14.2 g/dL (ref 12.0–15.0)
MCH: 29.6 pg (ref 26.0–34.0)
MCHC: 34.8 g/dL (ref 30.0–36.0)
MCV: 85.2 fL (ref 80.0–100.0)
Platelets: 291 10*3/uL (ref 150–400)
RBC: 4.79 MIL/uL (ref 3.87–5.11)
RDW: 12.5 % (ref 11.5–15.5)
WBC: 9.4 10*3/uL (ref 4.0–10.5)
nRBC: 0 % (ref 0.0–0.2)

## 2020-12-06 LAB — GROUP A STREP BY PCR: Group A Strep by PCR: NOT DETECTED

## 2020-12-06 LAB — LACTIC ACID, PLASMA: Lactic Acid, Venous: 1.3 mmol/L (ref 0.5–1.9)

## 2020-12-06 MED ORDER — SUCRALFATE 1 GM/10ML PO SUSP: 1.0000 g | Freq: Four times a day (QID) | ORAL | 0 refills | Status: DC

## 2020-12-06 MED ORDER — PREDNISONE 50 MG PO TABS: 50.0000 mg | ORAL_TABLET | Freq: Every day | ORAL | 0 refills | Status: DC

## 2020-12-06 MED ORDER — CHLORHEXIDINE GLUCONATE 0.12 % MT SOLN: 10.0000 mL | Freq: Two times a day (BID) | OROMUCOSAL | 0 refills | Status: DC

## 2020-12-06 MED ORDER — SODIUM CHLORIDE 0.9 % IV BOLUS
1000.0000 mL | Freq: Once | INTRAVENOUS | Status: AC
  Administered 2020-12-06: 1000 mL via INTRAVENOUS

## 2020-12-06 MED ORDER — PSEUDOEPH-BROMPHEN-DM 30-2-10 MG/5ML PO SYRP: 10.0000 mL | ORAL_SOLUTION | Freq: Four times a day (QID) | ORAL | 0 refills | Status: DC | PRN

## 2020-12-06 NOTE — ED Provider Notes (Signed)
[facility] Emergency Department Provider Note  ____________________________________________  Time seen: Approximately 2:21 PM  I have reviewed the triage vital signs and the nursing notes.   HISTORY  Chief Complaint Mouth Lesions and Shortness of Breath    HPI Brittany Nguyen is a 55 y.o. female who presents the emergency department for complaint of fever, oral lesion, sore throat, cough.  Patient states that she has had 2 to 3 days of symptoms.  Initially she thought she had a virus but as she has had worsening symptoms she became more concerned given her complex medical history that it could be something else.  Patient states that she has had some mild shortness of breath with a cough but no frank difficulty be breathing.  She is had no audible wheezing.  No chest pain associated with this.  Patient has had some mild sore throat which she describes as a scratchy/raw sensation over the night she states that now it feels as if she is having difficulty swallowing.  No unilateral pain is described.  She did develop a fever last night that progressed into today as well.  Patient also is concerned for an oral lesion along the left upper maxillary gumline.  Patient states that she wears dentures and has been told that she needs to have a remolding next year, so she is unsure whether this is related to her other symptoms or whether this may be a rub from her dentures.  Patient states that it is very sore but she has not noticed any swelling to the area and no facial edema or erythema.         Past Medical History:  Diagnosis Date  . Asthma 2013  . 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 CHF (congestive heart failure) ([place])   . COPD (chronic obstructive pulmonary disease) ([place])   . Diverticulitis 2010  . DVT (deep venous thrombosis) ([place])   . Endometriosis 1990  . Heart disease 2013  . Hx MRSA infection   . Iron deficiency   . Lump or mass in breast    . Restless leg   . Seizures ([place])   . 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 Non-ischemic cardiomyopathy ([place]) 01/23/2020  . 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/23/2020  . Seizure ([place]) 01/23/2020  . 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 Chronic use of opiate for therapeutic purpose 01/23/2020  . Type 2 diabetes mellitus without complication ([place]) 92/33/0076  . Iron deficiency anemia 03/27/2018  . Anemia 03/26/2018  . Abdominal pain 02/20/2018  . Unstable angina ([place])   . Encounter for anticoagulation discussion and counseling   . C. difficile colitis 01/14/2018  . GIB (gastrointestinal bleeding) 01/08/2018  . SOB (shortness of breath) 11/01/2017  . Influenza A 10/29/2017  . Acute respiratory failure ([place]) 09/24/2017  . Sepsis ([place]) 07/03/2017  . COPD (chronic obstructive pulmonary disease) ([place]) 04/26/2017  . Community acquired pneumonia 04/25/2017  . Chronic systolic heart failure ([place]) 02/03/2017  . Hypotension 02/03/2017  . Chest pain 12/09/2016  . RLS (restless legs syndrome) 12/09/2016  . Cough, persistent 11/22/2015  . Hypokalemia 11/22/2015  . Leukocytosis 11/22/2015  . Dyspnea 11/21/2015  . Respiratory distress 09/19/2015  . Breast pain 01/11/2013  . Hx MRSA infection   . Lump or mass in breast   . GOITER, MULTINODULAR 01/20/2009  . HYPOGLYCEMIA, UNSPECIFIED 01/20/2009  .  GERD 01/20/2009  . DIVERTICULITIS OF COLON 01/20/2009    Past Surgical History:  Procedure Laterality Date  . ABDOMINAL HYSTERECTOMY     age 54  . BREAST BIOPSY Right 2014   benign  . CARDIAC DEFIBRILLATOR PLACEMENT    . CARDIAC DEFIBRILLATOR PLACEMENT    . CESAREAN SECTION    . CHOLECYSTECTOMY    . COLECTOMY    . COLONOSCOPY WITH PROPOFOL N/A 02/22/2018   Procedure: COLONOSCOPY WITH PROPOFOL;  Surgeon: Toledo, Benay Pike, MD;  Location: [facility] ENDOSCOPY;   Service: Gastroenterology;  Laterality: N/A;  . ESOPHAGOGASTRODUODENOSCOPY (EGD) WITH PROPOFOL N/A 02/22/2018   Procedure: ESOPHAGOGASTRODUODENOSCOPY (EGD) WITH PROPOFOL;  Surgeon: Toledo, Benay Pike, MD;  Location: [facility] ENDOSCOPY;  Service: Gastroenterology;  Laterality: N/A;  . NASAL SINUS SURGERY  2012  . OOPHORECTOMY      Prior to Admission medications   Medication Sig Start Date End Date Taking? Authorizing Provider  brompheniramine-pseudoephedrine-DM 30-2-10 MG/5ML syrup Take 10 mLs by mouth 4 (four) times daily as needed. 12/06/20  Yes Darshay Deupree, Charline Bills, PA-C  chlorhexidine (PERIDEX) 0.12 % solution Use as directed 10 mLs in the mouth or throat 2 (two) times daily. Swish and spit 12/06/20  Yes Adaline Trejos, Charline Bills, PA-C  predniSONE (DELTASONE) 50 MG tablet Take 1 tablet (50 mg total) by mouth daily with breakfast. 12/06/20  Yes Alajiah Dutkiewicz, Charline Bills, PA-C  sucralfate (CARAFATE) 1 GM/10ML suspension Take 10 mLs (1 g total) by mouth 4 (four) times daily. 12/06/20 12/06/21 Yes Suleima Ohlendorf, Charline Bills, PA-C  albuterol (ACCUNEB) 1.25 MG/3ML nebulizer solution Take 3 mLs by nebulization every 6 (six) hours as needed for wheezing. 11/09/17   [provider]  albuterol (VENTOLIN HFA) 108 (90 Base) MCG/ACT inhaler Inhale 2 puffs into the lungs every 6 (six) hours as needed for shortness of breath. 08/28/16   [provider]  beclomethasone (QVAR) 80 MCG/ACT inhaler Inhale 1 puff into the lungs 2 (two) times daily. 06/12/17   [provider]  busPIRone (BUSPAR) 15 MG tablet Take 15 mg by mouth 2 (two) times daily as needed.  05/08/18   [provider]  DULoxetine (CYMBALTA) 30 MG capsule Take 30 mg by mouth daily. 10/27/19   [provider]  ENTRESTO 24-26 MG Take 0.5 tablets by mouth 2 (two) times daily. 01/08/20   [provider]  famotidine (PEPCID) 20 MG tablet Take 1 tablet (20 mg total) by mouth 2 (two) times daily. 03/15/19   Carrie Mew, MD  FARXIGA  5 MG TABS tablet Take 5 mg by mouth daily. 11/11/19   [provider]  fentaNYL (DURAGESIC) 50 MCG/HR Place 1 patch onto the skin every 3 (three) days. 10/08/18   Epifanio Lesches, MD  furosemide (LASIX) 40 MG tablet Take 80 mg by mouth 2 (two) times daily.  07/09/18   [provider]  hydrOXYzine (ATARAX/VISTARIL) 25 MG tablet Take 25 mg by mouth every 6 (six) hours as needed for itching. 04/13/19   [provider]  levothyroxine (SYNTHROID) 175 MCG tablet Take 175 mcg by mouth daily before breakfast.     [provider]  LORazepam (ATIVAN) 1 MG tablet Take 1 mg by mouth 4 (four) times daily as needed. 12/20/19   [provider]  losartan (COZAAR) 25 MG tablet Take 25 mg by mouth daily.     [provider]  meloxicam (MOBIC) 15 MG tablet Take 1 tablet (15 mg total) by mouth daily. 09/24/20 09/24/21  Lannie Fields, PA-C  metoCLOPramide (REGLAN) 10  MG tablet Take 1 tablet (10 mg total) by mouth every 8 (eight) hours as needed for up to 3 days for nausea. 02/11/20 02/14/20  Rudene Re, MD  metoprolol succinate (TOPROL-XL) 50 MG 24 hr tablet Take 50 mg by mouth 2 (two) times daily. 01/25/19   [provider]  mirtazapine (REMERON) 15 MG tablet Take 15 mg by mouth at bedtime. 04/26/19 05/26/24  [provider]  morphine (MSIR) 15 MG tablet Take 15 mg by mouth as needed for severe pain.     [provider]  Multiple Vitamins-Minerals (ONE-A-DAY WOMENS PO) Take 1 tablet by mouth daily.    [provider]  nitroGLYCERIN (NITROSTAT) 0.4 MG SL tablet Place 1 tablet (0.4 mg total) under the tongue every 5 (five) minutes as needed for chest pain. 01/24/17   Henreitta Leber, MD  omeprazole (PRILOSEC) 40 MG capsule Take 40 mg by mouth 2 (two) times daily. 10/19/18   [provider]  oxyCODONE (OXY IR/ROXICODONE) 5 MG immediate release tablet Take 5-10 mg by mouth every 4 (four) hours as needed for pain. 04/11/19    [provider]  oxyCODONE-acetaminophen (PERCOCET) 5-325 MG tablet Take 1 tablet by mouth every 4 (four) hours as needed. 02/11/20   Rudene Re, MD  oxyCODONE-acetaminophen (PERCOCET/ROXICET) 5-325 MG tablet Take 1 tablet by mouth every 4 (four) hours as needed.  01/17/20   [provider]  Pancrelipase, Lip-Prot-Amyl, (CREON) 24000-76000 units CPEP Take 3-4 capsules by mouth 3 (three) times daily with meals. 04/01/19   [provider]  pantoprazole (PROTONIX) 40 MG tablet Take 40 mg by mouth 2 (two) times daily. 12/20/19   [provider]  potassium chloride (KLOR-CON) 10 MEQ tablet Take 2 tablets (20 mEq total) by mouth daily for 5 days. 09/20/20 09/25/20  Vanessa [place], MD  potassium chloride SA (KLOR-CON M20) 20 MEQ tablet Take 1 tablet (20 mEq total) by mouth 2 (two) times daily for 7 days. 07/09/19 01/23/20  Carrie Mew, MD  promethazine (PHENERGAN) 25 MG tablet Take 1 tablet (25 mg total) by mouth every 6 (six) hours as needed. 07/09/19   Carrie Mew, MD  rOPINIRole (REQUIP) 4 MG tablet Take 4 mg by mouth at bedtime. 01/30/19   [provider]  spironolactone (ALDACTONE) 50 MG tablet Take 50 mg by mouth daily. 11/26/18   [provider]  vitamin B-12 (CYANOCOBALAMIN) 1000 MCG tablet Take 1,000 mcg by mouth daily.    [provider]    Allergies Contrast media [iodinated diagnostic agents], Lidocaine, Metrizamide, Penicillins, Isosorbide nitrate, Latex, Ondansetron, Zofran [ondansetron hcl], Povidone-iodine, and Pulmicort [budesonide]  Family History  Problem Relation Age of Onset  . Cancer Mother 30       ovarian  . CAD Mother   . Cancer Father 49       brain  . CAD Father   . Cancer Daughter 18       skin  . Cancer Maternal Aunt 28       breast  . Leukemia Paternal Grandfather     Social History Social History   Tobacco Use  . Smoking status: Never Smoker  . Smokeless tobacco: Never Used  Vaping Use   . Vaping Use: Never used  Substance Use Topics  . Alcohol use: No  . Drug use: No     Review of Systems  Constitutional: No fever/chills Eyes: No visual changes. No discharge ENT: Positive for sore throat, lesion to the upper left maxilla gumline Cardiovascular: no  chest pain. Respiratory: Positive cough.  No SOB. Gastrointestinal: No abdominal pain.  No nausea, no vomiting.  No diarrhea.  No constipation. Genitourinary: Negative for dysuria. No hematuria Musculoskeletal: Negative for musculoskeletal pain. Skin: Negative for rash, abrasions, lacerations, ecchymosis. Neurological: Negative for headaches, focal weakness or numbness.  10 System ROS otherwise negative.  ____________________________________________   PHYSICAL EXAM:  VITAL SIGNS: ED Triage Vitals  Enc Vitals Group     BP 12/06/20 1256 102/70     Pulse Rate 12/06/20 1256 88     Resp 12/06/20 1256 20     Temp 12/06/20 1256 98.4 F (36.9 C)     Temp Source 12/06/20 1256 Oral     SpO2 12/06/20 1256 98 %     Weight 12/06/20 1259 190 lb (86.2 kg)     Height 12/06/20 1259 5\' 5"  (1.651 m)     Head Circumference --      Peak Flow --      Pain Score 12/06/20 1259 8     Pain Loc --      Pain Edu? --      Excl. in [place]? --      Constitutional: Alert and oriented. Well appearing and in no acute distress. Eyes: Conjunctivae are normal. PERRL. EOMI. Head: Atraumatic. ENT:      Ears:       Nose: No congestion/rhinnorhea.      Mouth/Throat: Mucous membranes are moist.  Visualization of the gum reveals no upper dentition.  There is an erosion along the left upper gum line.  There is no gross erythema.  Area is tender to palpation but no drainable fluid collection identified.  This does not extend with any edema into the left cheek or remainder of the gumline.  Oropharynx is identified with some mild erythema and mild tonsillar enlargement but no exudates.  No uvular deviation. Neck: No stridor.  Neck is supple full  range of motion.  Nontender to palpation on the anterior and posterior aspects of the neck. Hematological/Lymphatic/Immunilogical: No cervical lymphadenopathy. Cardiovascular: Normal rate, regular rhythm. Normal S1 and S2.  Good peripheral circulation. Respiratory: Normal respiratory effort without tachypnea or retractions. Lungs CTAB. Good air entry to the bases with no decreased or absent breath sounds. Musculoskeletal: Full range of motion to all extremities. No gross deformities appreciated. Neurologic:  Normal speech and language. No gross focal neurologic deficits are appreciated.  Skin:  Skin is warm, dry and intact. No rash noted. Psychiatric: Mood and affect are normal. Speech and behavior are normal. Patient exhibits appropriate insight and judgement.   ____________________________________________   LABS (all labs ordered are listed, but only abnormal results are displayed)  Labs Reviewed  BASIC METABOLIC PANEL - Abnormal; Notable for the following components:      Result Value   Glucose, Bld 108 (*)    Calcium 8.7 (*)    All other components within normal limits  GROUP A STREP BY PCR  RESP PANEL BY RT-PCR (FLU A&B, COVID) ARPGX2  CBC  LACTIC ACID, PLASMA  LACTIC ACID, PLASMA   ____________________________________________  EKG   ____________________________________________  RADIOLOGY I personally viewed and evaluated these images as part of my medical decision making, as well as reviewing the written report by the radiologist.  ED Provider Interpretation: No acute cardiopulmonary findings on chest x-ray.  CT scan of the neck revealed findings consistent with viral pharyngitis.  No evidence of peritonsillar or retropharyngeal abscess.  No acute findings in the mouth.  DG Chest 2 View  Result Date: 12/06/2020 CLINICAL DATA:  Shortness of breath. EXAM: CHEST - 2 VIEW COMPARISON:  Radiographs 09/20/2020.  CT 12/15/2012. FINDINGS: Left subclavian AICD leads appear  unchanged. The heart size and mediastinal contours are stable. There is chronic central airway thickening. No edema, confluent airspace opacity, pleural effusion or pneumothorax. The bones appear unchanged. IMPRESSION: Stable chest without evidence of acute cardiopulmonary process. Probable chronic central airway thickening. Electronically Signed   By: Richardean Sale M.D.   On: 12/06/2020 14:08   CT Soft Tissue Neck Wo Contrast  Result Date: 12/06/2020 CLINICAL DATA:  Sore throat. Difficulty swallowing. Epiglottitis or tonsillitis. Patient unable to tolerate contrast secondary to prior contrast allergies. Fever. EXAM: CT NECK WITHOUT CONTRAST TECHNIQUE: Multidetector CT imaging of the neck was performed following the standard protocol without intravenous contrast. COMPARISON:  None. FINDINGS: Pharynx and larynx: Mild prominence of palatine tonsils noted bilaterally. No fluid collections present. There is some stranding of the right parapharyngeal fat. Mild prominence of lingual tonsils noted without a discrete mass. Nasopharynx is clear. Soft palate is normal. Insert normal epiglottis Aryepiglottic folds and piriform sinuses are clear. Vocal cords are midline and symmetric. Trachea is clear. Salivary glands: Submandibular and parotid glands are within normal limits bilaterally. Thyroid: Normal Lymph nodes: Bilateral reactive type lymph nodes present. No pathologically enlarged rounded nodes present. No necrotic nodes. Vascular: No significant vascular calcifications noted. Limited intracranial: Within normal limits. Visualized orbits: The globes and orbits are within normal limits. Mastoids and visualized paranasal sinuses: Fluid level is present in the left sphenoid sinus. Skeleton: Vertebral body heights and alignment are normal. No focal lytic or blastic lesions are present. Upper chest: Lung apices are clear. Thoracic inlet is within normal limits. Cardiac pacing wires anterior via a left subclavian  approach. IMPRESSION: 1. Mild prominence of the palatine tonsils and lingual tonsils may represent acute pharyngitis/tonsillitis. Asymmetric parapharyngeal fat stranding noted on the right. This may be part of an inflammatory process. 2. No abscess. 3. Bilateral reactive type lymph nodes. 4. Left sphenoid sinus disease. Electronically Signed   By: San Morelle M.D.   On: 12/06/2020 15:44    ____________________________________________    PROCEDURES  Procedure(s) performed:    Procedures    Medications  sodium chloride 0.9 % bolus 1,000 mL (0 mLs Intravenous Stopped 12/06/20 1712)     ____________________________________________   INITIAL IMPRESSION / ASSESSMENT AND PLAN / ED COURSE  Pertinent labs & imaging results that were available during my care of the patient were reviewed by me and considered in my medical decision making (see chart for details).  Review of the [place] CSRS was performed in accordance of the [place] prior to dispensing any controlled drugs.           Patient's diagnosis is consistent with viral illness, gingival ulcer.  Patient presented to emergency department complaining of sore throat, cough, shortness of breath, ulcer to the left upper gumline.  Overall exam was reassuring.  Appeared that patient likely had viral symptoms with either irritation from dentures versus aphthous ulcer to the gumline.  Labs, imaging are reassuring currently.  Patient be treated with symptom control medications including prednisone, Carafate, Bromfed cough syrup and Peridex mouthwash.  Return precautions discussed with the patient.  Patient is stable for discharge at this time.  Follow-up primary care as needed..  Patient is given ED precautions to return to the ED for any worsening or new symptoms.     ____________________________________________  FINAL CLINICAL IMPRESSION(S) / ED DIAGNOSES  Final  diagnoses:  Viral illness  Gingival ulcer      NEW MEDICATIONS  STARTED DURING THIS VISIT:  ED Discharge Orders         Ordered    sucralfate (CARAFATE) 1 GM/10ML suspension  4 times daily        12/06/20 1804    predniSONE (DELTASONE) 50 MG tablet  Daily with breakfast        12/06/20 1804    brompheniramine-pseudoephedrine-DM 30-2-10 MG/5ML syrup  4 times daily PRN        12/06/20 1804    chlorhexidine (PERIDEX) 0.12 % solution  2 times daily        12/06/20 1804              This chart was dictated using voice recognition software/Dragon. Despite best efforts to proofread, errors can occur which can change the meaning. Any change was purely unintentional.    Darletta Moll, PA-C 12/06/20 1806    Vladimir Crofts, MD 12/07/20 1623

## 2020-12-06 NOTE — ED Notes (Addendum)
Patient is CAOx4, showed staff the inside of her mouth. Patient has what appears to be a tear in her mouth at the connection of the upper gum line. She states it appeared yesterday. Patient does wear dentures and cant wear them today because of this tear.

## 2020-12-06 NOTE — ED Triage Notes (Addendum)
Pt via POV from home. Pt has a sore on the anterior part of her gums. Pt does wear dentures. Pt states that her pain stated last night and woke up with a sore throat and pain where the sore is. States she also has been having SOB, increased fatigue and difficulty swallowing since this also. Pt states she had a fever of 101.4 and took Tylenol around 1 hour ago. Pt is A&Ox4 and NAD.

## 2021-02-04 IMAGING — CT CT HEAD W/O CM
3 series · 15 of 47 positions shown, 18 images · non-contrast
Comparison: 05/02/2019

CLINICAL DATA: Seizure

EXAM:
CT HEAD WITHOUT CONTRAST
TECHNIQUE: Contiguous axial images were obtained from the base of the skull
through the vertex without intravenous contrast.

[Series 3: head wo · axial · 0.42mm/px · z∈[+335,+460]mm · 9 of 30 slices shown, 12 images]
[im 3/30  brain]
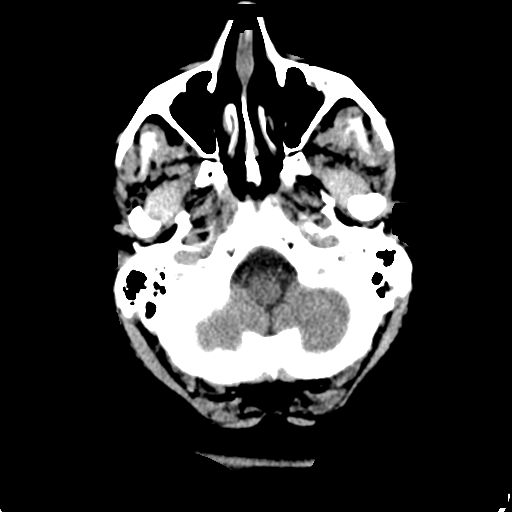
[im 3/30  bone]
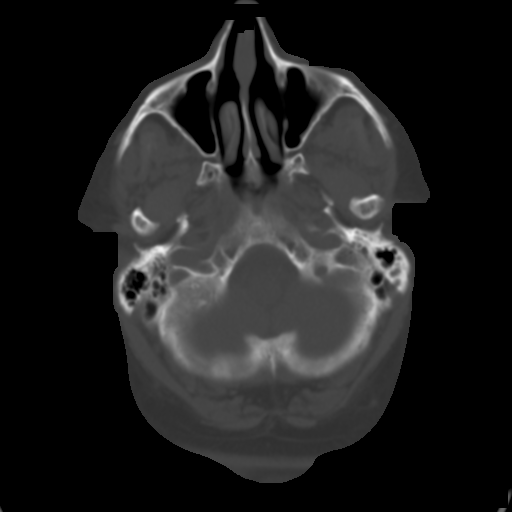
[im 6/30  brain]
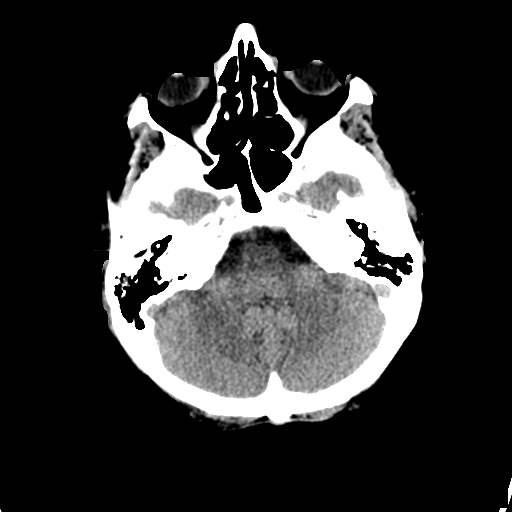
[im 9/30  brain]
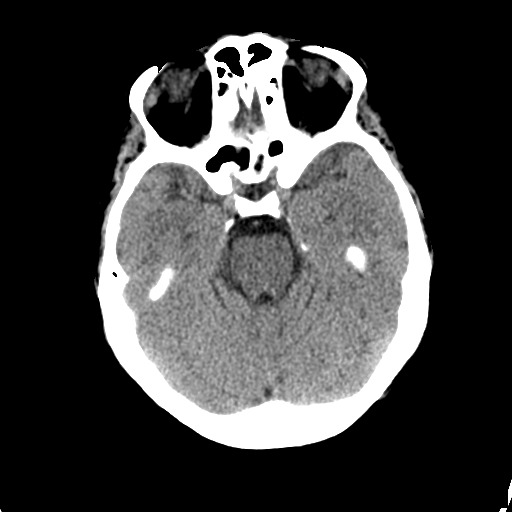
[im 12/30  brain]
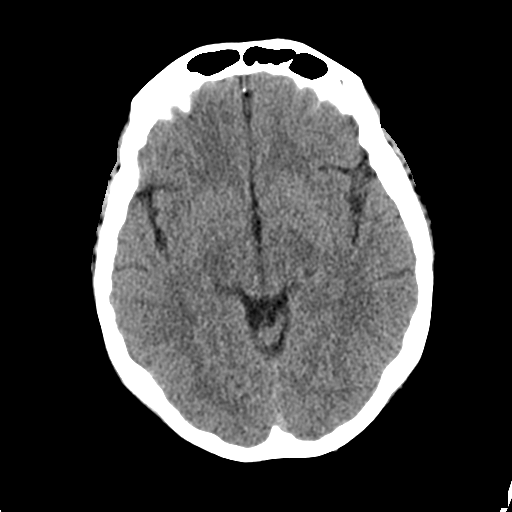
[im 16/30  brain]
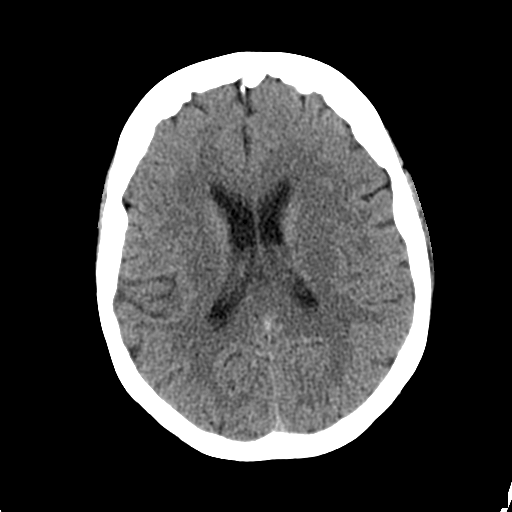
[im 16/30  bone]
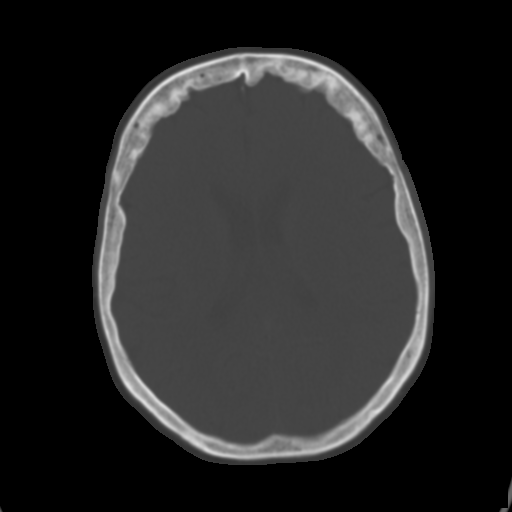
[im 19/30  brain]
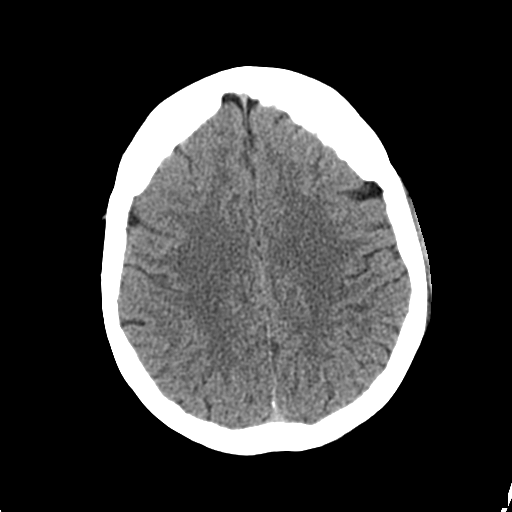
[im 22/30  brain]
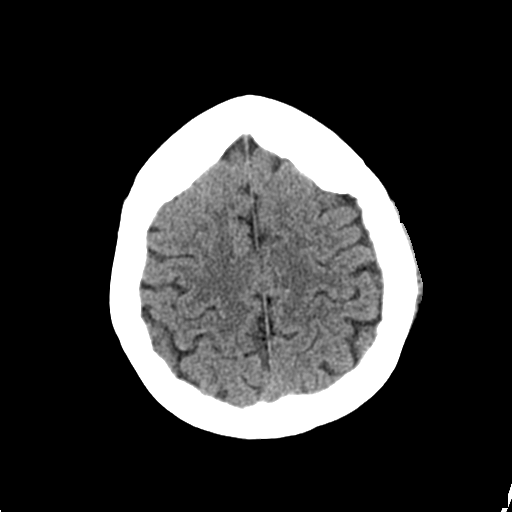
[im 25/30  brain]
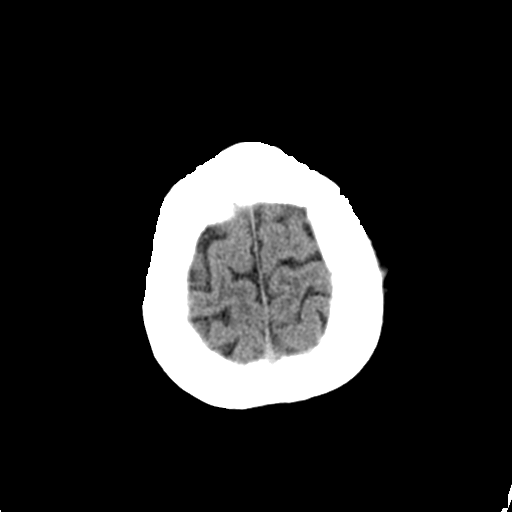
[im 28/30  brain]
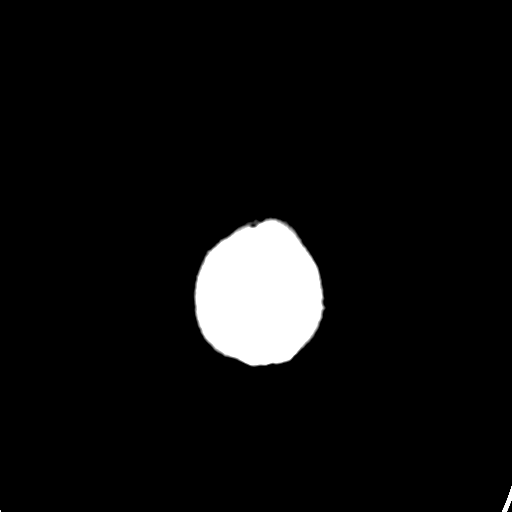
[im 28/30  bone]
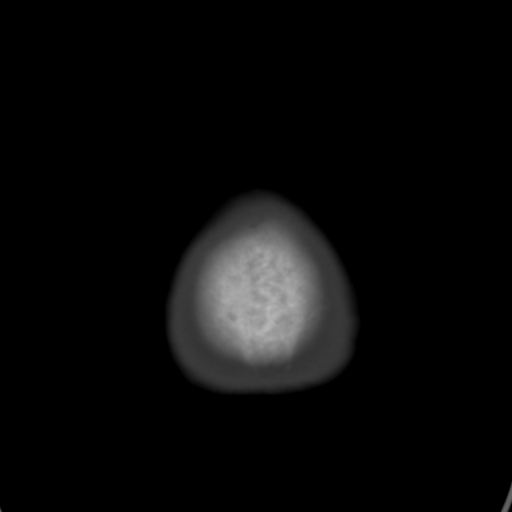

[Series 4: coronal soft tissue · coronal · 0.28mm/px · 3 of 58 slices shown]
[im 20/58  brain]
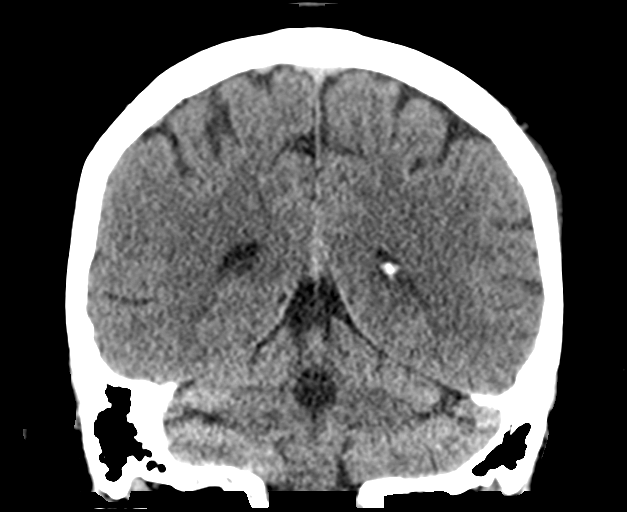
[im 26/58  brain]
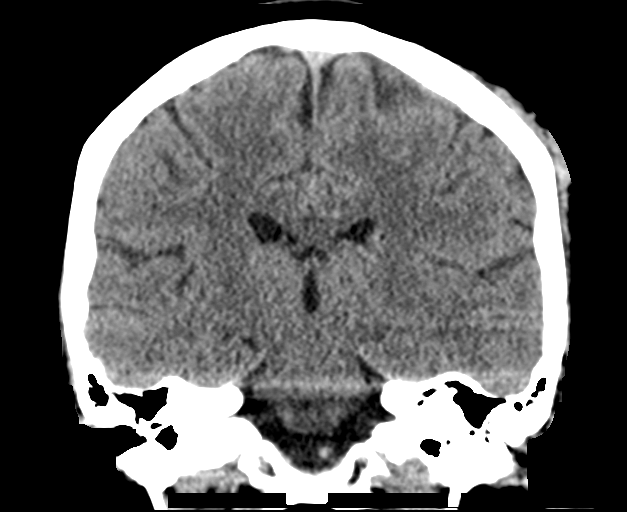
[im 32/58  brain]
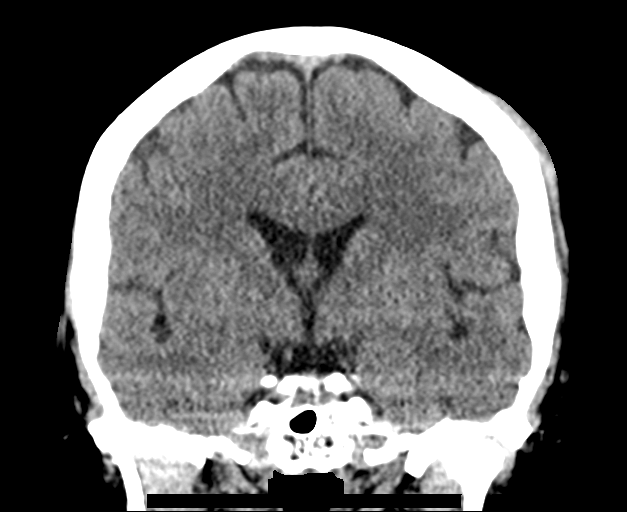

[Series 5: sagittal soft tissue · sagittal · 0.28mm/px · 3 of 51 slices shown]
[im 17/51  brain]
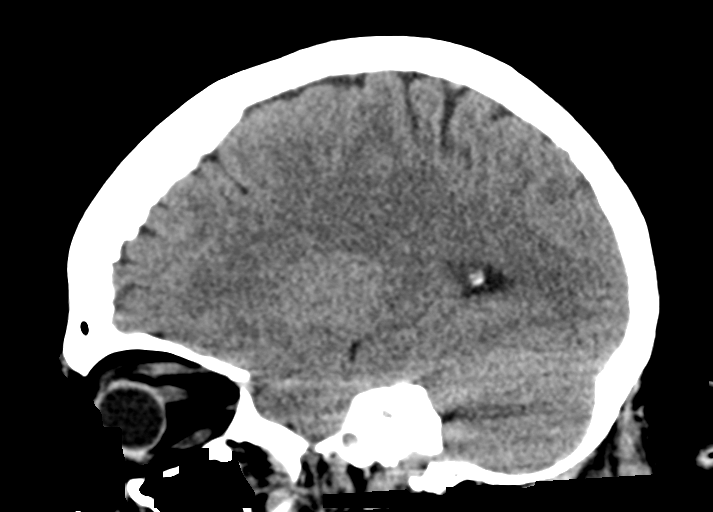
[im 26/51  brain]
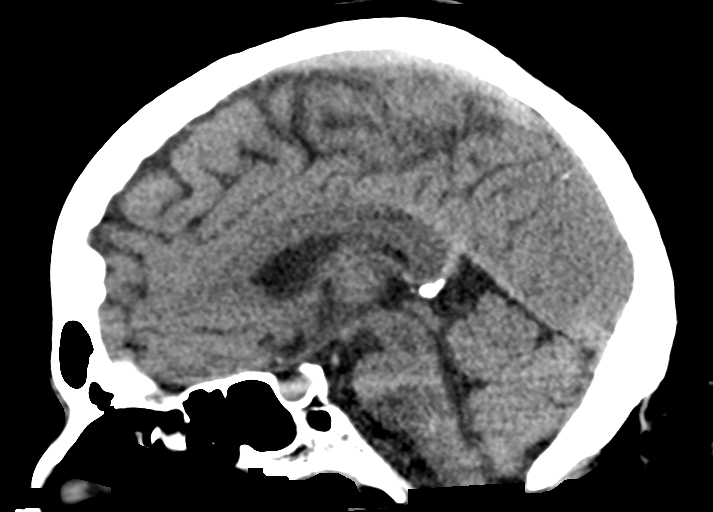
[im 34/51  brain]
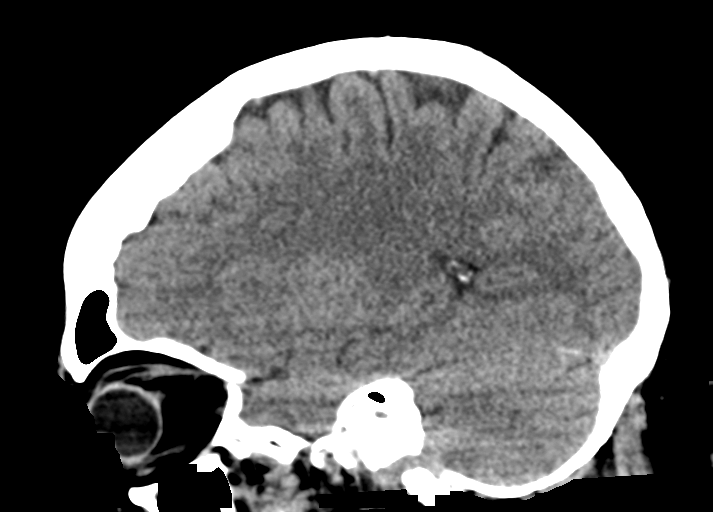

[15 of 47 positions shown; findings below may reference images not displayed]

FINDINGS: Brain: There is no acute intracranial hemorrhage, mass effect, or
edema. Gray-white differentiation is preserved. There is no
extra-axial fluid collection. Ventricles and sulci are within normal
limits in size and configuration.

Vascular: There is atherosclerotic calcification at the skull base.

Skull: Calvarium is unremarkable.

Sinuses/Orbits: No acute finding.

Other: Left scalp soft tissue swelling. Mastoid air cells are clear.
IMPRESSION: No acute intracranial hemorrhage, mass effect, or evidence of acute
infarction.

## 2021-02-26 ENCOUNTER — Emergency Department
Admission: EM | Admit: 2021-02-26 | Discharge: 2021-02-26 | Disposition: A | Discharge status: Home / Self Care | Payer: Medicare Other | Source: Home / Self Care | Attending: Emergency Medicine | Admitting: Emergency Medicine

## 2021-02-26 ENCOUNTER — Emergency Department
Admission: EM | Admit: 2021-02-26 | Discharge: 2021-02-26 | Disposition: A | Discharge status: Home / Self Care | Payer: Medicare Other | Attending: Emergency Medicine | Admitting: Emergency Medicine

## 2021-02-26 ENCOUNTER — Emergency Department: Payer: Medicare Other

## 2021-02-26 ENCOUNTER — Other Ambulatory Visit: Payer: Self-pay

## 2021-02-26 ENCOUNTER — Encounter: Payer: Self-pay | Admitting: Intensive Care

## 2021-02-26 ENCOUNTER — Encounter: Payer: Self-pay | Admitting: Emergency Medicine

## 2021-02-26 DIAGNOSIS — Z85028 Personal history of other malignant neoplasm of stomach: Secondary | ICD-10-CM | POA: Insufficient documentation

## 2021-02-26 DIAGNOSIS — Z5321 Procedure and treatment not carried out due to patient leaving prior to being seen by health care provider: Secondary | ICD-10-CM | POA: Insufficient documentation

## 2021-02-26 DIAGNOSIS — I5022 Chronic systolic (congestive) heart failure: Secondary | ICD-10-CM | POA: Insufficient documentation

## 2021-02-26 DIAGNOSIS — G8929 Other chronic pain: Secondary | ICD-10-CM | POA: Diagnosis not present

## 2021-02-26 DIAGNOSIS — Z91040 Latex allergy status: Secondary | ICD-10-CM | POA: Insufficient documentation

## 2021-02-26 DIAGNOSIS — J45909 Unspecified asthma, uncomplicated: Secondary | ICD-10-CM | POA: Insufficient documentation

## 2021-02-26 DIAGNOSIS — R112 Nausea with vomiting, unspecified: Secondary | ICD-10-CM | POA: Insufficient documentation

## 2021-02-26 DIAGNOSIS — Z79899 Other long term (current) drug therapy: Secondary | ICD-10-CM | POA: Insufficient documentation

## 2021-02-26 DIAGNOSIS — R109 Unspecified abdominal pain: Secondary | ICD-10-CM

## 2021-02-26 DIAGNOSIS — R531 Weakness: Secondary | ICD-10-CM | POA: Insufficient documentation

## 2021-02-26 DIAGNOSIS — Z7951 Long term (current) use of inhaled steroids: Secondary | ICD-10-CM | POA: Insufficient documentation

## 2021-02-26 DIAGNOSIS — R1030 Lower abdominal pain, unspecified: Secondary | ICD-10-CM | POA: Insufficient documentation

## 2021-02-26 DIAGNOSIS — J449 Chronic obstructive pulmonary disease, unspecified: Secondary | ICD-10-CM | POA: Insufficient documentation

## 2021-02-26 DIAGNOSIS — E119 Type 2 diabetes mellitus without complications: Secondary | ICD-10-CM | POA: Insufficient documentation

## 2021-02-26 DIAGNOSIS — K219 Gastro-esophageal reflux disease without esophagitis: Secondary | ICD-10-CM | POA: Insufficient documentation

## 2021-02-26 LAB — LIPASE, BLOOD: Lipase: 37 U/L (ref 11–51)

## 2021-02-26 LAB — COMPREHENSIVE METABOLIC PANEL
ALT: 22 U/L (ref 0–44)
ALT: 22 U/L (ref 0–44)
AST: 23 U/L (ref 15–41)
AST: 20 U/L (ref 15–41)
Albumin: 4.2 g/dL (ref 3.5–5.0)
Albumin: 4.1 g/dL (ref 3.5–5.0)
Alkaline Phosphatase: 115 U/L (ref 38–126)
Alkaline Phosphatase: 114 U/L (ref 38–126)
Anion gap: 10 (ref 5–15)
Anion gap: 7 (ref 5–15)
BUN: 10 mg/dL (ref 6–20)
BUN: 11 mg/dL (ref 6–20)
CO2: 28 mmol/L (ref 22–32)
CO2: 31 mmol/L (ref 22–32)
Calcium: 8.7 mg/dL — ABNORMAL LOW (ref 8.9–10.3)
Calcium: 9.1 mg/dL (ref 8.9–10.3)
Chloride: 98 mmol/L (ref 98–111)
Chloride: 97 mmol/L — ABNORMAL LOW (ref 98–111)
Creatinine, Ser: 0.86 mg/dL (ref 0.44–1.00)
Creatinine, Ser: 0.84 mg/dL (ref 0.44–1.00)
GFR, Estimated: >60 mL/min (ref >60)
GFR, Estimated: >60 mL/min (ref >60)
Glucose, Bld: 116 mg/dL — ABNORMAL HIGH (ref 70–99)
Glucose, Bld: 135 mg/dL — ABNORMAL HIGH (ref 70–99)
Potassium: 3.5 mmol/L (ref 3.5–5.1)
Potassium: 4.1 mmol/L (ref 3.5–5.1)
Sodium: 136 mmol/L (ref 135–145)
Sodium: 135 mmol/L (ref 135–145)
Total Bilirubin: 0.7 mg/dL (ref 0.3–1.2)
Total Bilirubin: 0.7 mg/dL (ref 0.3–1.2)
Total Protein: 8.3 g/dL — ABNORMAL HIGH (ref 6.5–8.1)
Total Protein: 8.2 g/dL — ABNORMAL HIGH (ref 6.5–8.1)

## 2021-02-26 LAB — CBC WITH DIFFERENTIAL/PLATELET
Abs Immature Granulocytes: 0.1 10*3/uL — ABNORMAL HIGH (ref 0.00–0.07)
Basophils Absolute: 0 10*3/uL (ref 0.0–0.1)
Basophils Relative: 0 %
Eosinophils Absolute: 0.1 10*3/uL (ref 0.0–0.5)
Eosinophils Relative: 1 %
HCT: 42.5 % (ref 36.0–46.0)
Hemoglobin: 15.1 g/dL — ABNORMAL HIGH (ref 12.0–15.0)
Immature Granulocytes: 1 %
Lymphocytes Relative: 16 %
Lymphs Abs: 1.9 10*3/uL (ref 0.7–4.0)
MCH: 29.7 pg (ref 26.0–34.0)
MCHC: 35.5 g/dL (ref 30.0–36.0)
MCV: 83.5 fL (ref 80.0–100.0)
Monocytes Absolute: 0.5 10*3/uL (ref 0.1–1.0)
Monocytes Relative: 4 %
Neutro Abs: 9.7 10*3/uL — ABNORMAL HIGH (ref 1.7–7.7)
Neutrophils Relative %: 78 %
Platelets: 286 10*3/uL (ref 150–400)
RBC: 5.09 MIL/uL (ref 3.87–5.11)
RDW: 13.6 % (ref 11.5–15.5)
WBC: 12.3 10*3/uL — ABNORMAL HIGH (ref 4.0–10.5)
nRBC: 0 % (ref 0.0–0.2)

## 2021-02-26 LAB — CBC
HCT: 41.5 % (ref 36.0–46.0)
Hemoglobin: 14.8 g/dL (ref 12.0–15.0)
MCH: 30.1 pg (ref 26.0–34.0)
MCHC: 35.7 g/dL (ref 30.0–36.0)
MCV: 84.3 fL (ref 80.0–100.0)
Platelets: 292 10*3/uL (ref 150–400)
RBC: 4.92 MIL/uL (ref 3.87–5.11)
RDW: 13.3 % (ref 11.5–15.5)
WBC: 13.7 10*3/uL — ABNORMAL HIGH (ref 4.0–10.5)
nRBC: 0 % (ref 0.0–0.2)

## 2021-02-26 LAB — URINALYSIS, COMPLETE (UACMP) WITH MICROSCOPIC
Bacteria, UA: NONE SEEN
Bilirubin Urine: NEGATIVE
Glucose, UA: NEGATIVE mg/dL
Hgb urine dipstick: NEGATIVE
Ketones, ur: NEGATIVE mg/dL
Leukocytes,Ua: NEGATIVE
Nitrite: NEGATIVE
Protein, ur: NEGATIVE mg/dL
Specific Gravity, Urine: 1.008 (ref 1.005–1.030)
pH: 7 (ref 5.0–8.0)

## 2021-02-26 MED ORDER — DICYCLOMINE HCL 10 MG PO CAPS
10.0000 mg | ORAL_CAPSULE | Freq: Once | ORAL | Status: AC
  Administered 2021-02-26: 10 mg via ORAL
  Filled 2021-02-26: qty 1

## 2021-02-26 MED ORDER — HYDROMORPHONE HCL 1 MG/ML IJ SOLN
0.5000 mg | Freq: Once | INTRAMUSCULAR | Status: AC
  Administered 2021-02-26: 0.5 mg via INTRAVENOUS
  Filled 2021-02-26: qty 1

## 2021-02-26 MED ORDER — SODIUM CHLORIDE 0.9 % IV SOLN
12.5000 mg | Freq: Four times a day (QID) | INTRAVENOUS | Status: DC | PRN
  Administered 2021-02-26: 12.5 mg via INTRAVENOUS
  Filled 2021-02-26 (×2): qty 0.5

## 2021-02-26 MED ORDER — SODIUM CHLORIDE 0.9 % IV BOLUS
500.0000 mL | Freq: Once | INTRAVENOUS | Status: AC
  Administered 2021-02-26: 500 mL via INTRAVENOUS

## 2021-02-26 MED ORDER — LACTATED RINGERS IV BOLUS
1000.0000 mL | Freq: Once | INTRAVENOUS | Status: AC
  Administered 2021-02-26: 1000 mL via INTRAVENOUS

## 2021-02-26 NOTE — ED Notes (Signed)
Pt vital signed checked by Jenny Reichmann, EDT. This RN pulled pts stickers to take pt to room, Pt was visualized getting into a car and leaving ED.

## 2021-02-26 NOTE — ED Provider Notes (Signed)
[facility] Emergency Department Provider Note ____________________________________________   Event Date/Time   First MD Initiated Contact with Patient 02/26/21 1825     (approximate)  I have reviewed the triage vital signs and the nursing notes.   HISTORY  Chief Complaint Abdominal Pain and Weakness  HPI Brittany Nguyen is a 55 y.o. female with history of CHF, COPD, DVT, neuroendocrine tumor of stomach presents to the emergency department for treatment and evaluation of lower abdominal pain with multiple episodes of nausea and vomiting.  She states that she has vomited approximately 5 times today and has not attempted to eat or drink anything in the past several hours.  She has taken some Phenergan without much improvement of the nausea.  She states that this pain is atypical in location and severity.  Pain is in the lower abdomen and is sharp.  She states she was advised to come to the emergency department by her gastroenterologist..         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    CHF (congestive heart failure) ([place])    COPD (chronic obstructive pulmonary disease) ([place])    Diverticulitis 2010   DVT (deep venous thrombosis) ([place])    Endometriosis 1990   Heart disease 2013   Hx MRSA infection    Iron deficiency    Lump or mass in breast    Restless leg    Seizures ([place])    Stomach cancer [facility])     Patient Active Problem List   Diagnosis Date Noted   Non-ischemic cardiomyopathy ([place]) 01/23/2020   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/23/2020   Seizure ([place]) 01/23/2020   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 difficile colitis 01/14/2018   GIB (gastrointestinal bleeding) 01/08/2018   SOB (shortness of breath) 11/01/2017   Influenza A 10/29/2017   Acute respiratory failure ([place]) 09/24/2017   Sepsis ([place]) 07/03/2017   COPD (chronic obstructive pulmonary disease) ([place]) 04/26/2017   Community acquired pneumonia 02/63/7858   Chronic systolic heart failure ([place]) 02/03/2017   Hypotension 02/03/2017   Chest pain 12/09/2016   RLS (restless legs syndrome) 12/09/2016   Cough, persistent 11/22/2015   Hypokalemia 11/22/2015   Leukocytosis 11/22/2015   Dyspnea 11/21/2015   Respiratory distress 09/19/2015   Breast pain 01/11/2013   Hx MRSA infection    Lump or mass in breast    GOITER, MULTINODULAR 01/20/2009   HYPOGLYCEMIA, UNSPECIFIED 01/20/2009   GERD 01/20/2009   DIVERTICULITIS OF COLON 01/20/2009    Past Surgical History:  Procedure Laterality Date   ABDOMINAL HYSTERECTOMY     age 40   BREAST BIOPSY Right 2014   benign   CARDIAC DEFIBRILLATOR PLACEMENT     CARDIAC DEFIBRILLATOR PLACEMENT     CESAREAN SECTION     CHOLECYSTECTOMY     COLECTOMY     COLONOSCOPY WITH PROPOFOL N/A 02/22/2018   Procedure: COLONOSCOPY WITH PROPOFOL;  Surgeon: Toledo, Benay Pike, MD;  Location: [facility] ENDOSCOPY;  Service: Gastroenterology;  Laterality: N/A;   ESOPHAGOGASTRODUODENOSCOPY (EGD) WITH PROPOFOL N/A 02/22/2018   Procedure: ESOPHAGOGASTRODUODENOSCOPY (EGD) WITH  PROPOFOL;  Surgeon: Toledo, Benay Pike, MD;  Location: [facility] ENDOSCOPY;  Service: Gastroenterology;  Laterality: N/A;   NASAL SINUS SURGERY  2012   OOPHORECTOMY      Prior to Admission medications   Medication Sig Start Date End Date Taking? Authorizing Provider  albuterol (ACCUNEB) 1.25 MG/3ML nebulizer solution Take 3 mLs by nebulization every 6 (six) hours as needed for wheezing. 11/09/17   [provider]  albuterol  (VENTOLIN HFA) 108 (90 Base) MCG/ACT inhaler Inhale 2 puffs into the lungs every 6 (six) hours as needed for shortness of breath. 08/28/16   [provider]  beclomethasone (QVAR) 80 MCG/ACT inhaler Inhale 1 puff into the lungs 2 (two) times daily. 06/12/17   [provider]  brompheniramine-pseudoephedrine-DM 30-2-10 MG/5ML syrup Take 10 mLs by mouth 4 (four) times daily as needed. 12/06/20   Cuthriell, Charline Bills, PA-C  busPIRone (BUSPAR) 15 MG tablet Take 15 mg by mouth 2 (two) times daily as needed.  05/08/18   [provider]  chlorhexidine (PERIDEX) 0.12 % solution Use as directed 10 mLs in the mouth or throat 2 (two) times daily. Swish and spit 12/06/20   Cuthriell, Charline Bills, PA-C  DULoxetine (CYMBALTA) 30 MG capsule Take 30 mg by mouth daily. 10/27/19   [provider]  ENTRESTO 24-26 MG Take 0.5 tablets by mouth 2 (two) times daily. 01/08/20   [provider]  famotidine (PEPCID) 20 MG tablet Take 1 tablet (20 mg total) by mouth 2 (two) times daily. 03/15/19   Carrie Mew, MD  FARXIGA 5 MG TABS tablet Take 5 mg by mouth daily. 11/11/19   [provider]  fentaNYL (DURAGESIC) 50 MCG/HR Place 1 patch onto the skin every 3 (three) days. 10/08/18   Epifanio Lesches, MD  furosemide (LASIX) 40 MG tablet Take 80 mg by mouth 2 (two) times daily.  07/09/18   [provider]  hydrOXYzine (ATARAX/VISTARIL) 25 MG tablet Take 25 mg by mouth every 6 (six) hours as needed for itching. 04/13/19   [provider]  levothyroxine (SYNTHROID) 175 MCG tablet Take 175 mcg by mouth daily before breakfast.     [provider]  LORazepam (ATIVAN) 1 MG tablet Take 1 mg by mouth 4 (four) times daily as needed. 12/20/19   [provider]  losartan (COZAAR) 25 MG tablet Take 25 mg by mouth daily.     [provider]  meloxicam (MOBIC) 15 MG tablet Take 1 tablet (15 mg total) by mouth daily. 09/24/20 09/24/21  Lannie Fields, PA-C   metoCLOPramide (REGLAN) 10 MG tablet Take 1 tablet (10 mg total) by mouth every 8 (eight) hours as needed for up to 3 days for nausea. 02/11/20 02/14/20  Rudene Re, MD  metoprolol succinate (TOPROL-XL) 50 MG 24 hr tablet Take 50 mg by mouth 2 (two) times daily. 01/25/19   [provider]  mirtazapine (REMERON) 15 MG tablet Take 15 mg by mouth at bedtime. 04/26/19 05/26/24  [provider]  morphine (MSIR) 15 MG tablet Take 15 mg by mouth as needed for severe pain.     [provider]  Multiple Vitamins-Minerals (ONE-A-DAY WOMENS PO) Take 1 tablet by mouth daily.    [provider]  nitroGLYCERIN (NITROSTAT) 0.4 MG SL tablet Place 1 tablet (0.4 mg total) under the tongue every 5 (five) minutes as needed for chest pain. 01/24/17   Henreitta Leber, MD  omeprazole (PRILOSEC) 40 MG capsule Take 40 mg by mouth 2 (two) times  daily. 10/19/18   [provider]  oxyCODONE (OXY IR/ROXICODONE) 5 MG immediate release tablet Take 5-10 mg by mouth every 4 (four) hours as needed for pain. 04/11/19   [provider]  oxyCODONE-acetaminophen (PERCOCET) 5-325 MG tablet Take 1 tablet by mouth every 4 (four) hours as needed. 02/11/20   Rudene Re, MD  oxyCODONE-acetaminophen (PERCOCET/ROXICET) 5-325 MG tablet Take 1 tablet by mouth every 4 (four) hours as needed.  01/17/20   [provider]  Pancrelipase, Lip-Prot-Amyl, (CREON) 24000-76000 units CPEP Take 3-4 capsules by mouth 3 (three) times daily with meals. 04/01/19   [provider]  pantoprazole (PROTONIX) 40 MG tablet Take 40 mg by mouth 2 (two) times daily. 12/20/19   [provider]  potassium chloride (KLOR-CON) 10 MEQ tablet Take 2 tablets (20 mEq total) by mouth daily for 5 days. 09/20/20 09/25/20  Vanessa Weston, MD  potassium chloride SA (KLOR-CON M20) 20 MEQ tablet Take 1 tablet (20 mEq total) by mouth 2 (two) times daily for 7 days. 07/09/19 01/23/20  Carrie Mew, MD   predniSONE (DELTASONE) 50 MG tablet Take 1 tablet (50 mg total) by mouth daily with breakfast. 12/06/20   Cuthriell, Charline Bills, PA-C  promethazine (PHENERGAN) 25 MG tablet Take 1 tablet (25 mg total) by mouth every 6 (six) hours as needed. 07/09/19   Carrie Mew, MD  rOPINIRole (REQUIP) 4 MG tablet Take 4 mg by mouth at bedtime. 01/30/19   [provider]  spironolactone (ALDACTONE) 50 MG tablet Take 50 mg by mouth daily. 11/26/18   [provider]  sucralfate (CARAFATE) 1 GM/10ML suspension Take 10 mLs (1 g total) by mouth 4 (four) times daily. 12/06/20 12/06/21  Cuthriell, Charline Bills, PA-C  vitamin B-12 (CYANOCOBALAMIN) 1000 MCG tablet Take 1,000 mcg by mouth daily.    [provider]    Allergies Contrast media [iodinated diagnostic agents], Lidocaine, Metrizamide, Penicillins, Isosorbide nitrate, Latex, Ondansetron, Zofran [ondansetron hcl], Povidone-iodine, and Pulmicort [budesonide]  Family History  Problem Relation Age of [place]   Cancer Mother 45       ovarian   CAD Mother    Cancer Father 31       brain   CAD Father    Cancer Daughter 1       skin   Cancer Maternal Aunt 50       breast   Leukemia Paternal Grandfather     Social History Social History   Tobacco Use   Smoking status: Never   Smokeless tobacco: Never  Vaping Use   Vaping Use: Never used  Substance Use Topics   Alcohol use: No   Drug use: Yes    Comment: prescribed fentanyl patch, morphine and phenergan    Review of Systems  Constitutional: No fever/chills Eyes: No visual changes. ENT: No sore throat. Cardiovascular: Denies chest pain. Respiratory: Denies shortness of breath. Gastrointestinal: Positive for abdominal pain with nausea and vomiting..  No diarrhea.  No constipation. Genitourinary: Negative for dysuria. Musculoskeletal: Negative for back pain. Skin: Negative for rash. Neurological: Negative for headaches, focal weakness or  numbness.  ____________________________________________   PHYSICAL EXAM:  VITAL SIGNS: ED Triage Vitals  Enc Vitals Group     BP 02/26/21 1813 92/70     Pulse Rate 02/26/21 1813 98     Resp 02/26/21 1813 18     Temp 02/26/21 1813 98.2 F (36.8 C)     Temp src --      SpO2 02/26/21 1813 97 %  Weight 02/26/21 1810 180 lb (81.6 kg)     Height 02/26/21 1810 5\' 5"  (1.651 m)     Head Circumference --      Peak Flow --      Pain Score 02/26/21 1810 8     Pain Loc --      Pain Edu? --      Excl. in [place]? --     Constitutional: Alert and oriented.  Uncomfortable appearing and in no acute distress. Eyes: Conjunctivae are normal.  Head: Atraumatic. Nose: No congestion/rhinnorhea. Mouth/Throat: Mucous membranes are moist.  Oropharynx non-erythematous. Neck: No stridor.   Hematological/Lymphatic/Immunilogical: No cervical lymphadenopathy. Cardiovascular: Normal rate, regular rhythm. Grossly normal heart sounds.  Good peripheral circulation. Respiratory: Normal respiratory effort.  No retractions. Lungs CTAB. Gastrointestinal: Soft and diffusely tender in the lower abdomen across the suprapubic area.  No distention. No abdominal bruits.  Genitourinary:  Musculoskeletal: No lower extremity tenderness nor edema.  No joint effusions. Neurologic:  Normal speech and language. No gross focal neurologic deficits are appreciated. No gait instability. Skin:  Skin is warm, dry and intact. No rash noted. Psychiatric: Mood and affect are normal. Speech and behavior are normal.  ____________________________________________   LABS (all labs ordered are listed, but only abnormal results are displayed)  Labs Reviewed - No data to display ____________________________________________  EKG  Not indicated  ____________________________________________  RADIOLOGY  ED MD interpretation:    Pending  I, Sherrie George, personally viewed and evaluated these images (plain radiographs) as part  of my medical decision making, as well as reviewing the written report by the radiologist.  Official radiology report(s): No results found.  ____________________________________________   PROCEDURES  Procedure(s) performed (including Critical Care):  Procedures  ____________________________________________   INITIAL IMPRESSION / ASSESSMENT AND PLAN     54 year old female presenting to the emergency department for treatment and evaluation of lower abdominal pain.  See HPI for further details.  Plan will be to get some labs, give some fluids, and get a CT of the abdomen and pelvis.  DIFFERENTIAL DIAGNOSIS  Diverticulosis, diverticulitis, gastroparesis, intra-abdominal disease process progression  ED COURSE  Some improvement after phenergan IV.  Awaiting CT results. Care relinquished to Dr. Jacqualine Code who will follow patient to disposition.    ___________________________________________   FINAL CLINICAL IMPRESSION(S) / ED DIAGNOSES  Final diagnoses:  None   Clinical impression: Abdominal pain.  ED Discharge Orders     None        Brittany Nguyen was evaluated in Emergency Department on 02/26/2021 for the symptoms described in the history of present illness. She was evaluated in the context of the global COVID-19 pandemic, which necessitated consideration that the patient might be at risk for infection with the SARS-CoV-2 virus that causes COVID-19. Institutional protocols and algorithms that pertain to the evaluation of patients at risk for COVID-19 are in a state of rapid change based on information released by regulatory bodies including the CDC and federal and state organizations. These policies and algorithms were followed during the patient's care in the ED.   Note:  This document was prepared using Dragon voice recognition software and may include unintentional dictation errors.    Victorino Dike, FNP 03/02/21 2101    Delman Kitten, MD 03/05/21 [phone]

## 2021-02-26 NOTE — ED Triage Notes (Addendum)
Patient c/o abdominal pain with N/V. HX gastroparesis and stomach tumors. Denies urinary symptoms. Patient prescribed pain meds through pain clinic for chronic abdominal pain. Currently wearing fentanyl patch on right arm. Also takes morphine pills and phenergan.

## 2021-02-26 NOTE — ED Provider Notes (Signed)
Medical screening examination/treatment/procedure(s) were conducted as a shared visit with non-physician practitioner(s) and myself.  I personally evaluated the patient during the encounter.   ----------------------------------------- 8:54 PM on 02/26/2021 ----------------------------------------- Patient is resting comfortably, does still have some mild crampy pain in the lower abdomen but reports she started to feel better.  Her blood pressure is much improved currently 960 systolic.  She is fully awake and alert.  She reports that she will take Bentyl at home but has not yet taken that, has a longstanding history of gastroparesis with somewhat similar presentations.  CT imaging today is reassuring without evidence of acute intra-abdominal pathology or obstruction.  Labs reviewed, mild leukocytosis, currently on doxycycline since yesterday for a tick bite.  She reports the tick was not engorged and she did not develop any fevers myalgias body aches headaches rash or other symptoms that would be highly suggestive of acute tickborne illness developing though  Discussed with her, and will trial some additional fluids, small dose of Bentyl, half dose of hydromorphone.  Patient comfortable with this plan, feels like if she is hydrated her symptoms will likely improve as she has a history of similar in the past.  Of note, we does discussed risk benefits of doxycycline, and if in fact she feels this is precipitating her abdominal cramps and vomiting she could can discontinue and monitor closely for development of symptoms of tickborne illness which we reviewed.  However I did encourage her to stay on this medication if possible, but again risk benefit if it is causing her significant gastrointestinal distress with recommend discontinuation and less further evidence arises of obvious developing tickborne illness though the patient reports the tick was likely only on her for an hour or 2 and was not  engorged.  Vitals:   02/26/21 2100 02/26/21 2130  BP: 111/65 103/69  Pulse: 90 86  Resp: 16 18  Temp:    SpO2: 98% 100%    Urinalysis reviewed and clear  Patient sitting up alert oriented feels improved.  She appears well, very pleasant.  No distress and no concerns at this time.  Patient understanding of plan for discharge we reviewed careful return precautions.  She does report she has already got Phenergan and Bentyl at home and does not require any additional prescriptions.  Her husband will be driving her home.  Alert well oriented well-appearing nontoxic.  Stable for discharge.  Return precautions and treatment recommendations and follow-up discussed with the patient who is agreeable with the plan.    Delman Kitten, MD 02/26/21 [phone]

## 2021-02-26 NOTE — ED Notes (Signed)
This RN called and spoke with pt and informed her that we were fixing to call her back to a room and noticed that she was getting in a car to leave.Pt was asked if she was going to get care else where, pt stated that she was not, that she called her husband to take her home because she did not feel like she could stay and wait any longer. Pt was encouraged to come back to the ED and get care. Pt states that she will come back.

## 2021-02-26 NOTE — ED Triage Notes (Signed)
Pt to ED via POV, pt has hx/o stomach tumors and gastroparesis. Pt states that she has been having abdominal pain all day today. Pt hypotensive. Pt states that she has fentanyl patch on right arm and that she has also morphine pills at home but she only takes those at night, pt has not had any morphine today. Pt states that she has also had N/V.

## 2021-02-26 NOTE — Discharge Instructions (Addendum)
No driving this evening.   Please return to the emergency room right away if you are to develop a fever, severe nausea, your pain becomes severe or worsens, you are unable to keep food down, begin vomiting any dark or bloody fluid, you develop any dark or bloody stools, feel dehydrated, or other new concerns or symptoms arise.

## 2021-03-02 NOTE — ED Provider Notes (Signed)
[facility] Emergency Department Provider Note ____________________________________________   Event Date/Time   First MD Initiated Contact with Patient 02/26/21 1825     (approximate)  I have reviewed the triage vital signs and the nursing notes.   HISTORY  Chief Complaint Abdominal Pain and Weakness  HPI Brittany Nguyen is a 55 y.o. female with history of CHF, COPD, neuroendocrine tumors of the stomach as well as C. difficile and diverticulitis presents to the emergency department for treatment and evaluation of abdominal pain not relieved by her fentanyl patch.  Patient states that this pain is different from previous as it is lower in the abdomen and is on both sides.  She is mildly hypotensive but states that her blood pressure typically runs a little bit low.         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    CHF (congestive heart failure) ([place])    COPD (chronic obstructive pulmonary disease) ([place])    Diverticulitis 2010   DVT (deep venous thrombosis) ([place])    Endometriosis 1990   Heart disease 2013   Hx MRSA infection    Iron deficiency    Lump or mass in breast    Restless leg    Seizures ([place])    Stomach cancer [facility])     Patient Active Problem List   Diagnosis Date Noted   Non-ischemic cardiomyopathy ([place]) 01/23/2020   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/23/2020   Seizure ([place]) 01/23/2020   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 difficile colitis 01/14/2018   GIB (gastrointestinal bleeding)  01/08/2018   SOB (shortness of breath) 11/01/2017   Influenza A 10/29/2017   Acute respiratory failure ([place]) 09/24/2017   Sepsis ([place]) 07/03/2017   COPD (chronic obstructive pulmonary disease) ([place]) 04/26/2017   Community acquired pneumonia 78/58/8502   Chronic systolic heart failure ([place]) 02/03/2017   Hypotension 02/03/2017   Chest pain 12/09/2016   RLS (restless legs syndrome) 12/09/2016   Cough, persistent 11/22/2015   Hypokalemia 11/22/2015   Leukocytosis 11/22/2015   Dyspnea 11/21/2015   Respiratory distress 09/19/2015   Breast pain 01/11/2013   Hx MRSA infection    Lump or mass in breast    GOITER, MULTINODULAR 01/20/2009   HYPOGLYCEMIA, UNSPECIFIED 01/20/2009   GERD 01/20/2009   DIVERTICULITIS OF COLON 01/20/2009    Past Surgical History:  Procedure Laterality Date   ABDOMINAL HYSTERECTOMY     age 79   BREAST BIOPSY Right 2014   benign   CARDIAC DEFIBRILLATOR PLACEMENT     CARDIAC DEFIBRILLATOR PLACEMENT     CESAREAN SECTION     CHOLECYSTECTOMY     COLECTOMY     COLONOSCOPY WITH PROPOFOL N/A 02/22/2018   Procedure: COLONOSCOPY WITH PROPOFOL;  Surgeon: Toledo, Benay Pike, MD;  Location: [facility] ENDOSCOPY;  Service: Gastroenterology;  Laterality: N/A;   ESOPHAGOGASTRODUODENOSCOPY (EGD) WITH PROPOFOL N/A 02/22/2018   Procedure: ESOPHAGOGASTRODUODENOSCOPY (EGD) WITH PROPOFOL;  Surgeon: Toledo, Benay Pike, MD;  Location: [facility] ENDOSCOPY;  Service: Gastroenterology;  Laterality: N/A;   NASAL SINUS SURGERY  2012   OOPHORECTOMY  Prior to Admission medications   Medication Sig Start Date End Date Taking? Authorizing Provider  albuterol (ACCUNEB) 1.25 MG/3ML nebulizer solution Take 3 mLs by nebulization every 6 (six) hours as needed for wheezing. 11/09/17   [provider]  albuterol (VENTOLIN HFA) 108 (90 Base) MCG/ACT inhaler Inhale 2 puffs into the lungs every 6 (six) hours as needed for shortness of breath. 08/28/16   [provider]  beclomethasone (QVAR)  80 MCG/ACT inhaler Inhale 1 puff into the lungs 2 (two) times daily. 06/12/17   [provider]  brompheniramine-pseudoephedrine-DM 30-2-10 MG/5ML syrup Take 10 mLs by mouth 4 (four) times daily as needed. 12/06/20   Cuthriell, Charline Bills, PA-C  busPIRone (BUSPAR) 15 MG tablet Take 15 mg by mouth 2 (two) times daily as needed.  05/08/18   [provider]  chlorhexidine (PERIDEX) 0.12 % solution Use as directed 10 mLs in the mouth or throat 2 (two) times daily. Swish and spit 12/06/20   Cuthriell, Charline Bills, PA-C  DULoxetine (CYMBALTA) 30 MG capsule Take 30 mg by mouth daily. 10/27/19   [provider]  ENTRESTO 24-26 MG Take 0.5 tablets by mouth 2 (two) times daily. 01/08/20   [provider]  famotidine (PEPCID) 20 MG tablet Take 1 tablet (20 mg total) by mouth 2 (two) times daily. 03/15/19   Carrie Mew, MD  FARXIGA 5 MG TABS tablet Take 5 mg by mouth daily. 11/11/19   [provider]  fentaNYL (DURAGESIC) 50 MCG/HR Place 1 patch onto the skin every 3 (three) days. 10/08/18   Epifanio Lesches, MD  furosemide (LASIX) 40 MG tablet Take 80 mg by mouth 2 (two) times daily.  07/09/18   [provider]  hydrOXYzine (ATARAX/VISTARIL) 25 MG tablet Take 25 mg by mouth every 6 (six) hours as needed for itching. 04/13/19   [provider]  levothyroxine (SYNTHROID) 175 MCG tablet Take 175 mcg by mouth daily before breakfast.     [provider]  LORazepam (ATIVAN) 1 MG tablet Take 1 mg by mouth 4 (four) times daily as needed. 12/20/19   [provider]  losartan (COZAAR) 25 MG tablet Take 25 mg by mouth daily.     [provider]  meloxicam (MOBIC) 15 MG tablet Take 1 tablet (15 mg total) by mouth daily. 09/24/20 09/24/21  Lannie Fields, PA-C  metoCLOPramide (REGLAN) 10 MG tablet Take 1 tablet (10 mg total) by mouth every 8 (eight) hours as needed for up to 3 days for nausea. 02/11/20 02/14/20  Rudene Re, MD  metoprolol  succinate (TOPROL-XL) 50 MG 24 hr tablet Take 50 mg by mouth 2 (two) times daily. 01/25/19   [provider]  mirtazapine (REMERON) 15 MG tablet Take 15 mg by mouth at bedtime. 04/26/19 05/26/24  [provider]  morphine (MSIR) 15 MG tablet Take 15 mg by mouth as needed for severe pain.     [provider]  Multiple Vitamins-Minerals (ONE-A-DAY WOMENS PO) Take 1 tablet by mouth daily.    [provider]  nitroGLYCERIN (NITROSTAT) 0.4 MG SL tablet Place 1 tablet (0.4 mg total) under the tongue every 5 (five) minutes as needed for chest pain. 01/24/17   Henreitta Leber, MD  omeprazole (PRILOSEC) 40 MG capsule Take 40 mg by mouth 2 (two) times daily. 10/19/18   [provider]  oxyCODONE (OXY IR/ROXICODONE) 5 MG immediate release tablet Take 5-10 mg by mouth every 4 (four) hours as needed for pain. 04/11/19  [provider]  oxyCODONE-acetaminophen (PERCOCET) 5-325 MG tablet Take 1 tablet by mouth every 4 (four) hours as needed. 02/11/20   Rudene Re, MD  oxyCODONE-acetaminophen (PERCOCET/ROXICET) 5-325 MG tablet Take 1 tablet by mouth every 4 (four) hours as needed.  01/17/20   [provider]  Pancrelipase, Lip-Prot-Amyl, (CREON) 24000-76000 units CPEP Take 3-4 capsules by mouth 3 (three) times daily with meals. 04/01/19   [provider]  pantoprazole (PROTONIX) 40 MG tablet Take 40 mg by mouth 2 (two) times daily. 12/20/19   [provider]  potassium chloride (KLOR-CON) 10 MEQ tablet Take 2 tablets (20 mEq total) by mouth daily for 5 days. 09/20/20 09/25/20  Vanessa Oliver, MD  potassium chloride SA (KLOR-CON M20) 20 MEQ tablet Take 1 tablet (20 mEq total) by mouth 2 (two) times daily for 7 days. 07/09/19 01/23/20  Carrie Mew, MD  predniSONE (DELTASONE) 50 MG tablet Take 1 tablet (50 mg total) by mouth daily with breakfast. 12/06/20   Cuthriell, Charline Bills, PA-C  promethazine (PHENERGAN) 25 MG tablet Take 1 tablet (25 mg  total) by mouth every 6 (six) hours as needed. 07/09/19   Carrie Mew, MD  rOPINIRole (REQUIP) 4 MG tablet Take 4 mg by mouth at bedtime. 01/30/19   [provider]  spironolactone (ALDACTONE) 50 MG tablet Take 50 mg by mouth daily. 11/26/18   [provider]  sucralfate (CARAFATE) 1 GM/10ML suspension Take 10 mLs (1 g total) by mouth 4 (four) times daily. 12/06/20 12/06/21  Cuthriell, Charline Bills, PA-C  vitamin B-12 (CYANOCOBALAMIN) 1000 MCG tablet Take 1,000 mcg by mouth daily.    [provider]    Allergies Contrast media [iodinated diagnostic agents], Lidocaine, Metrizamide, Penicillins, Isosorbide nitrate, Latex, Ondansetron, Zofran [ondansetron hcl], Povidone-iodine, and Pulmicort [budesonide]  Family History  Problem Relation Age of Onset   Cancer Mother 65       ovarian   CAD Mother    Cancer Father 45       brain   CAD Father    Cancer Daughter 27       skin   Cancer Maternal Aunt 9       breast   Leukemia Paternal Grandfather     Social History Social History   Tobacco Use   Smoking status: Never   Smokeless tobacco: Never  Vaping Use   Vaping Use: Never used  Substance Use Topics   Alcohol use: No   Drug use: Yes    Comment: prescribed fentanyl patch, morphine and phenergan    Review of Systems  Constitutional: No fever/chills Eyes: No visual changes. ENT: No sore throat. Cardiovascular: Denies chest pain. Respiratory: Denies shortness of breath. Gastrointestinal: Positive for nausea and abdominal pain Genitourinary: Negative for dysuria. Musculoskeletal: Negative for back pain. Skin: Negative for rash. Neurological: Negative for headaches, focal weakness or numbness.  ____________________________________________   PHYSICAL EXAM:  VITAL SIGNS: ED Triage Vitals  Enc Vitals Group     BP 02/26/21 1813 92/70     Pulse Rate 02/26/21 1813 98     Resp 02/26/21 1813 18     Temp 02/26/21 1813 98.2 F (36.8 C)     Temp src  --      SpO2 02/26/21 1813 97 %     Weight 02/26/21 1810 180 lb (81.6 kg)     Height 02/26/21 1810 5\' 5"  (1.651 m)     Head Circumference --      Peak Flow --  Pain Score 02/26/21 1810 8     Pain Loc --      Pain Edu? --      Excl. in [place]? --     Constitutional: Alert and oriented. Well appearing and in no acute distress. Eyes: Conjunctivae are normal. PERRL. EOMI. Head: Atraumatic. Nose: No congestion/rhinnorhea. Mouth/Throat: Mucous membranes are moist.  Oropharynx non-erythematous. Neck: No stridor.   Hematological/Lymphatic/Immunilogical: No cervical lymphadenopathy. Cardiovascular: Normal rate, regular rhythm. Grossly normal heart sounds.  Good peripheral circulation. Respiratory: Normal respiratory effort.  No retractions. Lungs CTAB. Gastrointestinal: Soft and nontender. No distention. No abdominal bruits. No CVA tenderness. Genitourinary:  Musculoskeletal: No lower extremity tenderness nor edema.  No joint effusions. Neurologic:  Normal speech and language. No gross focal neurologic deficits are appreciated. No gait instability. Skin:  Skin is warm, dry and intact. No rash noted. Psychiatric: Mood and affect are normal. Speech and behavior are normal.  ____________________________________________   LABS (all labs ordered are listed, but only abnormal results are displayed)  Labs Reviewed  COMPREHENSIVE METABOLIC PANEL - Abnormal; Notable for the following components:      Result Value   Chloride 97 (*)    Glucose, Bld 135 (*)    Total Protein 8.2 (*)    All other components within normal limits  CBC WITH DIFFERENTIAL/PLATELET - Abnormal; Notable for the following components:   WBC 12.3 (*)    Hemoglobin 15.1 (*)    Neutro Abs 9.7 (*)    Abs Immature Granulocytes 0.10 (*)    All other components within normal limits  URINALYSIS, COMPLETE (UACMP) WITH MICROSCOPIC - Abnormal; Notable for the following components:   Color, Urine YELLOW (*)    APPearance CLEAR  (*)    All other components within normal limits   ____________________________________________  EKG  Not indicated ____________________________________________  RADIOLOGY  ED MD interpretation:    No acute abnormality on CT of the abdomen and pelvis I, Quincey Nored, personally viewed and evaluated these images (plain radiographs) as part of my medical decision making, as well as reviewing the written report by the radiologist.  Official radiology report(s): No results found.  ____________________________________________   PROCEDURES  Procedure(s) performed (including Critical Care):  Procedures  ____________________________________________   INITIAL IMPRESSION / ASSESSMENT AND PLAN     55 year old female presenting to the emergency department for treatment and evaluation of symptoms as described in the HPI.  It appears that it has been few years since she has had any imaging of the abdomen or pelvis.  Treatment plan discussed with the patient and CT will be ordered.  DIFFERENTIAL DIAGNOSIS  Diverticulitis, intra-abdominal infection, disease progression  ED COURSE  White blood cell count slightly elevated but without a left shift.  Labs are otherwise unremarkable.  Patient states that she is feeling some better after IV Phenergan.  Awaiting results of CT of the abdomen and pelvis.  Care transferred to Dr. Jacqualine Code who will follow patient to disposition.    ___________________________________________   FINAL CLINICAL IMPRESSION(S) / ED DIAGNOSES  Final diagnoses:  Abdominal cramps     ED Discharge Orders     None        Brittany Nguyen was evaluated in Emergency Department on 03/02/2021 for the symptoms described in the history of present illness. She was evaluated in the context of the global COVID-19 pandemic, which necessitated consideration that the patient might be at risk for infection with the SARS-CoV-2 virus that causes COVID-19.  Institutional protocols and algorithms that pertain  to the evaluation of patients at risk for COVID-19 are in a state of rapid change based on information released by regulatory bodies including the CDC and federal and state organizations. These policies and algorithms were followed during the patient's care in the ED.   Note:  This document was prepared using Dragon voice recognition software and may include unintentional dictation errors.    Victorino Dike, FNP 03/02/21 1944    Delman Kitten, MD 03/05/21 1539

## 2021-03-07 ENCOUNTER — Encounter: Payer: Self-pay | Admitting: Emergency Medicine

## 2021-03-07 ENCOUNTER — Emergency Department: Payer: Medicare Other

## 2021-03-07 ENCOUNTER — Emergency Department
Admission: EM | Admit: 2021-03-07 | Discharge: 2021-03-07 | Disposition: A | Discharge status: Home / Self Care | Payer: Medicare Other | Attending: Emergency Medicine | Admitting: Emergency Medicine

## 2021-03-07 ENCOUNTER — Other Ambulatory Visit: Payer: Self-pay

## 2021-03-07 DIAGNOSIS — Z91040 Latex allergy status: Secondary | ICD-10-CM | POA: Diagnosis not present

## 2021-03-07 DIAGNOSIS — M5442 Lumbago with sciatica, left side: Secondary | ICD-10-CM | POA: Diagnosis not present

## 2021-03-07 DIAGNOSIS — M5441 Lumbago with sciatica, right side: Secondary | ICD-10-CM | POA: Diagnosis not present

## 2021-03-07 DIAGNOSIS — E890 Postprocedural hypothyroidism: Secondary | ICD-10-CM | POA: Insufficient documentation

## 2021-03-07 DIAGNOSIS — I5022 Chronic systolic (congestive) heart failure: Secondary | ICD-10-CM | POA: Insufficient documentation

## 2021-03-07 DIAGNOSIS — M545 Low back pain, unspecified: Secondary | ICD-10-CM | POA: Diagnosis present

## 2021-03-07 DIAGNOSIS — J45909 Unspecified asthma, uncomplicated: Secondary | ICD-10-CM | POA: Diagnosis not present

## 2021-03-07 DIAGNOSIS — Z79899 Other long term (current) drug therapy: Secondary | ICD-10-CM | POA: Diagnosis not present

## 2021-03-07 DIAGNOSIS — I110 Hypertensive heart disease with heart failure: Secondary | ICD-10-CM | POA: Insufficient documentation

## 2021-03-07 DIAGNOSIS — E119 Type 2 diabetes mellitus without complications: Secondary | ICD-10-CM | POA: Insufficient documentation

## 2021-03-07 DIAGNOSIS — J449 Chronic obstructive pulmonary disease, unspecified: Secondary | ICD-10-CM | POA: Diagnosis not present

## 2021-03-07 DIAGNOSIS — Z7951 Long term (current) use of inhaled steroids: Secondary | ICD-10-CM | POA: Insufficient documentation

## 2021-03-07 LAB — BASIC METABOLIC PANEL
Anion gap: 8 (ref 5–15)
BUN: 9 mg/dL (ref 6–20)
CO2: 32 mmol/L (ref 22–32)
Calcium: 8.6 mg/dL — ABNORMAL LOW (ref 8.9–10.3)
Chloride: 100 mmol/L (ref 98–111)
Creatinine, Ser: 0.83 mg/dL (ref 0.44–1.00)
GFR, Estimated: >60 mL/min (ref >60)
Glucose, Bld: 135 mg/dL — ABNORMAL HIGH (ref 70–99)
Potassium: 3.2 mmol/L — ABNORMAL LOW (ref 3.5–5.1)
Sodium: 140 mmol/L (ref 135–145)

## 2021-03-07 LAB — CBC WITH DIFFERENTIAL/PLATELET
Abs Immature Granulocytes: 0.07 10*3/uL (ref 0.00–0.07)
Basophils Absolute: 0 10*3/uL (ref 0.0–0.1)
Basophils Relative: 0 %
Eosinophils Absolute: 0.2 10*3/uL (ref 0.0–0.5)
Eosinophils Relative: 2 %
HCT: 39.3 % (ref 36.0–46.0)
Hemoglobin: 13.3 g/dL (ref 12.0–15.0)
Immature Granulocytes: 1 %
Lymphocytes Relative: 16 %
Lymphs Abs: 1.9 10*3/uL (ref 0.7–4.0)
MCH: 29.6 pg (ref 26.0–34.0)
MCHC: 33.8 g/dL (ref 30.0–36.0)
MCV: 87.5 fL (ref 80.0–100.0)
Monocytes Absolute: 0.6 10*3/uL (ref 0.1–1.0)
Monocytes Relative: 5 %
Neutro Abs: 8.9 10*3/uL — ABNORMAL HIGH (ref 1.7–7.7)
Neutrophils Relative %: 76 %
Platelets: 247 10*3/uL (ref 150–400)
RBC: 4.49 MIL/uL (ref 3.87–5.11)
RDW: 13.6 % (ref 11.5–15.5)
WBC: 11.7 10*3/uL — ABNORMAL HIGH (ref 4.0–10.5)
nRBC: 0 % (ref 0.0–0.2)

## 2021-03-07 LAB — URINALYSIS, COMPLETE (UACMP) WITH MICROSCOPIC
Bacteria, UA: NONE SEEN
Bilirubin Urine: NEGATIVE
Glucose, UA: NEGATIVE mg/dL
Hgb urine dipstick: NEGATIVE
Ketones, ur: NEGATIVE mg/dL
Nitrite: NEGATIVE
Protein, ur: NEGATIVE mg/dL
Specific Gravity, Urine: 1.017 (ref 1.005–1.030)
pH: 5 (ref 5.0–8.0)

## 2021-03-07 MED ORDER — TIZANIDINE HCL 4 MG PO CAPS: 4.0000 mg | ORAL_CAPSULE | Freq: Three times a day (TID) | ORAL | 0 refills | Status: DC | PRN

## 2021-03-07 MED ORDER — HYDROMORPHONE HCL 1 MG/ML IJ SOLN
1.0000 mg | Freq: Once | INTRAMUSCULAR | Status: AC
  Administered 2021-03-07: 1 mg via INTRAVENOUS
  Filled 2021-03-07: qty 1

## 2021-03-07 MED ORDER — TIZANIDINE HCL 4 MG PO TABS
8.0000 mg | ORAL_TABLET | Freq: Once | ORAL | Status: AC
  Administered 2021-03-07: 8 mg via ORAL
  Filled 2021-03-07: qty 2

## 2021-03-07 MED ORDER — DEXAMETHASONE SODIUM PHOSPHATE 10 MG/ML IJ SOLN
10.0000 mg | Freq: Once | INTRAMUSCULAR | Status: AC
  Administered 2021-03-07: 10 mg via INTRAVENOUS
  Filled 2021-03-07: qty 1

## 2021-03-07 NOTE — ED Triage Notes (Signed)
Pt comes into the ED via [facility] from home c/o low back pain that radiates into bilateral legs.  Pt states she woke up with significant pain this morning.  Denies any injury that she is aware of.  Pt does currently have a fentanyl patch on her arm for chronic pain from her stomach cancer.  Pt tearful in triage at this time. Vitals stable with EMS.  At 77 HR, 113/69 BP, 100% RA.

## 2021-03-07 NOTE — ED Provider Notes (Signed)
[facility] Emergency Department Provider Note ____________________________________________   Event Date/Time   First MD Initiated Contact with Patient 03/07/21 0945     (approximate)  I have reviewed the triage vital signs and the nursing notes.   HISTORY  Chief Complaint Back Pain  HPI Brittany Nguyen is a 55 y.o. female with history of neuroendocrine tumor and remaining history as listed below presents to the emergency department for treatment and evaluation of  acute onset low back pain.  Symptoms started at approximately 2 AM on the right side and progressively worsened and spread to the left.  She states that she feels as though somebody has a knife stabbing her through both legs when she tries to move.  She denies injury.  She denies history of the same.  She is wearing her fentanyl patch and took one of her morphine tablets without any relief.      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[facility])     Patient Active Problem List   Diagnosis Date Noted   Non-ischemic cardiomyopathy ([place]) 01/23/2020   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/23/2020   Seizure ([place]) 01/23/2020   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 difficile colitis 01/14/2018   GIB (gastrointestinal bleeding) 01/08/2018   SOB (shortness of breath) 11/01/2017   Influenza A 10/29/2017   Acute respiratory failure ([place]) 09/24/2017   Sepsis ([place]) 07/03/2017   COPD (chronic obstructive pulmonary disease) ([place]) 04/26/2017   Community acquired pneumonia 27/02/2375   Chronic systolic heart failure ([place]) 02/03/2017   Hypotension 02/03/2017   Chest pain 12/09/2016   RLS (restless legs syndrome) 12/09/2016   Cough, persistent 11/22/2015   Hypokalemia 11/22/2015   Leukocytosis 11/22/2015   Dyspnea 11/21/2015   Respiratory distress 09/19/2015   Breast pain 01/11/2013   Hx MRSA infection    Lump or mass in breast    GOITER, MULTINODULAR 01/20/2009   HYPOGLYCEMIA, UNSPECIFIED 01/20/2009   GERD 01/20/2009   DIVERTICULITIS OF COLON 01/20/2009    Past Surgical History:  Procedure Laterality Date   ABDOMINAL HYSTERECTOMY     age 35   BREAST BIOPSY Right 2014   benign   CARDIAC DEFIBRILLATOR PLACEMENT     CARDIAC DEFIBRILLATOR PLACEMENT     CESAREAN SECTION     CHOLECYSTECTOMY     COLECTOMY     COLONOSCOPY WITH PROPOFOL N/A 02/22/2018   Procedure: COLONOSCOPY WITH PROPOFOL;  Surgeon: Toledo, Benay Pike, MD;  Location: [facility] ENDOSCOPY;  Service: Gastroenterology;  Laterality: N/A;   ESOPHAGOGASTRODUODENOSCOPY (EGD) WITH PROPOFOL N/A 02/22/2018   Procedure: ESOPHAGOGASTRODUODENOSCOPY (EGD) WITH PROPOFOL;  Surgeon: Toledo, Benay Pike, MD;  Location: [facility] ENDOSCOPY;  Service:  Gastroenterology;  Laterality: N/A;   NASAL SINUS SURGERY  2012   OOPHORECTOMY      Prior to Admission medications   Medication Sig Start Date End Date Taking? Authorizing Provider  tiZANidine (ZANAFLEX) 4 MG capsule Take 1 capsule (4 mg total) by mouth 3 (three) times daily as needed for up to 30 doses for muscle spasms. 03/07/21  Yes Odyn Turko B, FNP  albuterol (ACCUNEB) 1.25 MG/3ML nebulizer solution Take 3 mLs by nebulization every 6 (six)  hours as needed for wheezing. 11/09/17   [provider]  albuterol (VENTOLIN HFA) 108 (90 Base) MCG/ACT inhaler Inhale 2 puffs into the lungs every 6 (six) hours as needed for shortness of breath. 08/28/16   [provider]  beclomethasone (QVAR) 80 MCG/ACT inhaler Inhale 1 puff into the lungs 2 (two) times daily. 06/12/17   [provider]  brompheniramine-pseudoephedrine-DM 30-2-10 MG/5ML syrup Take 10 mLs by mouth 4 (four) times daily as needed. 12/06/20   Cuthriell, Charline Bills, PA-C  busPIRone (BUSPAR) 15 MG tablet Take 15 mg by mouth 2 (two) times daily as needed.  05/08/18   [provider]  chlorhexidine (PERIDEX) 0.12 % solution Use as directed 10 mLs in the mouth or throat 2 (two) times daily. Swish and spit 12/06/20   Cuthriell, Charline Bills, PA-C  DULoxetine (CYMBALTA) 30 MG capsule Take 30 mg by mouth daily. 10/27/19   [provider]  ENTRESTO 24-26 MG Take 0.5 tablets by mouth 2 (two) times daily. 01/08/20   [provider]  famotidine (PEPCID) 20 MG tablet Take 1 tablet (20 mg total) by mouth 2 (two) times daily. 03/15/19   Carrie Mew, MD  FARXIGA 5 MG TABS tablet Take 5 mg by mouth daily. 11/11/19   [provider]  fentaNYL (DURAGESIC) 50 MCG/HR Place 1 patch onto the skin every 3 (three) days. 10/08/18   Epifanio Lesches, MD  furosemide (LASIX) 40 MG tablet Take 80 mg by mouth 2 (two) times daily.  07/09/18   [provider]  hydrOXYzine (ATARAX/VISTARIL) 25 MG tablet Take 25 mg by mouth every 6 (six) hours as needed for itching. 04/13/19   [provider]  levothyroxine (SYNTHROID) 175 MCG tablet Take 175 mcg by mouth daily before breakfast.     [provider]  LORazepam (ATIVAN) 1 MG tablet Take 1 mg by mouth 4 (four) times daily as needed. 12/20/19   [provider]  losartan (COZAAR) 25 MG tablet Take 25 mg by mouth daily.     [provider]  meloxicam (MOBIC) 15 MG tablet Take 1  tablet (15 mg total) by mouth daily. 09/24/20 09/24/21  Lannie Fields, PA-C  metoCLOPramide (REGLAN) 10 MG tablet Take 1 tablet (10 mg total) by mouth every 8 (eight) hours as needed for up to 3 days for nausea. 02/11/20 02/14/20  Rudene Re, MD  metoprolol succinate (TOPROL-XL) 50 MG 24 hr tablet Take 50 mg by mouth 2 (two) times daily. 01/25/19   [provider]  mirtazapine (REMERON) 15 MG tablet Take 15 mg by mouth at bedtime. 04/26/19 05/26/24  [provider]  morphine (MSIR) 15 MG tablet Take 15 mg by mouth as needed for severe pain.     [provider]  Multiple Vitamins-Minerals (ONE-A-DAY WOMENS PO) Take 1 tablet by mouth daily.    [provider]  nitroGLYCERIN (NITROSTAT) 0.4 MG SL tablet Place 1 tablet (0.4 mg total) under the tongue every 5 (five) minutes as needed for chest  pain. 01/24/17   Henreitta Leber, MD  omeprazole (PRILOSEC) 40 MG capsule Take 40 mg by mouth 2 (two) times daily. 10/19/18   [provider]  oxyCODONE (OXY IR/ROXICODONE) 5 MG immediate release tablet Take 5-10 mg by mouth every 4 (four) hours as needed for pain. 04/11/19   [provider]  oxyCODONE-acetaminophen (PERCOCET) 5-325 MG tablet Take 1 tablet by mouth every 4 (four) hours as needed. 02/11/20   Rudene Re, MD  oxyCODONE-acetaminophen (PERCOCET/ROXICET) 5-325 MG tablet Take 1 tablet by mouth every 4 (four) hours as needed.  01/17/20   [provider]  Pancrelipase, Lip-Prot-Amyl, (CREON) 24000-76000 units CPEP Take 3-4 capsules by mouth 3 (three) times daily with meals. 04/01/19   [provider]  pantoprazole (PROTONIX) 40 MG tablet Take 40 mg by mouth 2 (two) times daily. 12/20/19   [provider]  potassium chloride (KLOR-CON) 10 MEQ tablet Take 2 tablets (20 mEq total) by mouth daily for 5 days. 09/20/20 09/25/20  Vanessa [place], MD  potassium chloride SA (KLOR-CON M20) 20 MEQ tablet Take 1 tablet (20 mEq total) by mouth 2  (two) times daily for 7 days. 07/09/19 01/23/20  Carrie Mew, MD  predniSONE (DELTASONE) 50 MG tablet Take 1 tablet (50 mg total) by mouth daily with breakfast. 12/06/20   Cuthriell, Charline Bills, PA-C  promethazine (PHENERGAN) 25 MG tablet Take 1 tablet (25 mg total) by mouth every 6 (six) hours as needed. 07/09/19   Carrie Mew, MD  rOPINIRole (REQUIP) 4 MG tablet Take 4 mg by mouth at bedtime. 01/30/19   [provider]  spironolactone (ALDACTONE) 50 MG tablet Take 50 mg by mouth daily. 11/26/18   [provider]  sucralfate (CARAFATE) 1 GM/10ML suspension Take 10 mLs (1 g total) by mouth 4 (four) times daily. 12/06/20 12/06/21  Cuthriell, Charline Bills, PA-C  vitamin B-12 (CYANOCOBALAMIN) 1000 MCG tablet Take 1,000 mcg by mouth daily.    [provider]    Allergies Contrast media [iodinated diagnostic agents], Lidocaine, Metrizamide, Penicillins, Isosorbide nitrate, Latex, Ondansetron, Zofran [ondansetron hcl], Povidone-iodine, and Pulmicort [budesonide]  Family History  Problem Relation Age of Onset   Cancer Mother 36       ovarian   CAD Mother    Cancer Father 32       brain   CAD Father    Cancer Daughter 34       skin   Cancer Maternal Aunt 65       breast   Leukemia Paternal Grandfather     Social History Social History   Tobacco Use   Smoking status: Never   Smokeless tobacco: Never  Vaping Use   Vaping Use: Never used  Substance Use Topics   Alcohol use: No   Drug use: Yes    Comment: prescribed fentanyl patch, morphine and phenergan    Review of Systems  Constitutional: No fever/chills Eyes: No visual changes. ENT: No sore throat. Cardiovascular: Denies chest pain. Respiratory: Denies shortness of breath. Gastrointestinal: No abdominal pain.  No nausea, no vomiting.  No diarrhea.  No constipation. Genitourinary: Negative for dysuria. Musculoskeletal: Positive for transverse lower back pain and bilateral lower extremity pain Skin:  Negative for rash. Neurological: Negative for headaches, focal weakness or numbness.  ____________________________________________   PHYSICAL EXAM:  VITAL SIGNS: ED Triage Vitals  Enc Vitals Group     BP 03/07/21 0906 99/70     Pulse Rate 03/07/21 0906 91     Resp 03/07/21 0906 20  Temp 03/07/21 0906 98.2 F (36.8 C)     Temp Source 03/07/21 0906 Oral     SpO2 03/07/21 0906 98 %     Weight 03/07/21 0904 178 lb 9.2 oz (81 kg)     Height 03/07/21 0904 5\' 5"  (1.651 m)     Head Circumference --      Peak Flow --      Pain Score 03/07/21 0903 10     Pain Loc --      Pain Edu? --      Excl. in [place]? --     Constitutional: Alert and oriented. Well appearing and in no acute distress. Eyes: Conjunctivae are normal. PERRL. EOMI. Head: Atraumatic. Nose: No congestion/rhinnorhea. Mouth/Throat: Mucous membranes are moist.  Oropharynx non-erythematous. Neck: No stridor.   Hematological/Lymphatic/Immunilogical: No cervical lymphadenopathy. Cardiovascular: Normal rate, regular rhythm. Grossly normal heart sounds.  Good peripheral circulation. Respiratory: Normal respiratory effort.  No retractions. Lungs CTAB. Gastrointestinal: Soft and nontender. No distention. No abdominal bruits. No CVA tenderness. Genitourinary:  Musculoskeletal: No lower extremity tenderness nor edema.  No joint effusions. Neurologic:  Normal speech and language. No gross focal neurologic deficits are appreciated. No gait instability. Skin:  Skin is warm, dry and intact. No rash noted. Psychiatric: Mood and affect are normal. Speech and behavior are normal.  ____________________________________________   LABS (all labs ordered are listed, but only abnormal results are displayed)  Labs Reviewed  BASIC METABOLIC PANEL - Abnormal; Notable for the following components:      Result Value   Potassium 3.2 (*)    Glucose, Bld 135 (*)    Calcium 8.6 (*)    All other components within normal limits  CBC WITH  DIFFERENTIAL/PLATELET - Abnormal; Notable for the following components:   WBC 11.7 (*)    Neutro Abs 8.9 (*)    All other components within normal limits  URINALYSIS, COMPLETE (UACMP) WITH MICROSCOPIC - Abnormal; Notable for the following components:   Color, Urine YELLOW (*)    APPearance HAZY (*)    Leukocytes,Ua SMALL (*)    All other components within normal limits   ____________________________________________  EKG  Not indicated ____________________________________________  RADIOLOGY  ED MD interpretation:    CT of the lumbar spine shows no acute concerns. I, Sherrie George, personally viewed and evaluated these images (plain radiographs) as part of my medical decision making, as well as reviewing the written report by the radiologist.  Official radiology report(s): CT Lumbar Spine Wo Contrast  Result Date: 03/07/2021 CLINICAL DATA:  55 year old female with low back pain radiating to both legs. No known injury. EXAM: CT LUMBAR SPINE WITHOUT CONTRAST TECHNIQUE: Multidetector CT imaging of the lumbar spine was performed without intravenous contrast administration. Multiplanar CT image reconstructions were also generated. COMPARISON:  CT Abdomen and Pelvis 02/26/2021. FINDINGS: Segmentation: Normal. Alignment: Preserved lumbar lordosis. Vertebrae: Osteopenia. Intact lumbar vertebrae. Visible lower thoracic levels appear intact. Intact visible sacrum and SI joints. No acute or suspicious osseous lesion. Paraspinal and other soft tissues: Stable and largely negative visible noncontrast abdominal and pelvic viscera; chronic anastomosis in the sigmoid colon redemonstrated. Lumbar paraspinal soft tissues appear negative. Disc levels: T12-L1:  Negative. L1-L2: Mild disc space loss, circumferential disc bulge and endplate spurring. No stenosis. L2-L3: Circumferential disc bulge eccentric to the left. Mild endplate spurring. No definite spinal or lateral recess stenosis. Mild to moderate left L2  foraminal stenosis. L3-L4: Mild circumferential disc bulge. Mild facet and ligament flavum hypertrophy. No significant spinal stenosis. Mild to  moderate right L3 foraminal stenosis related to broad-based disc and endplate. L4-L5: Mild circumferential disc bulge. Moderate to severe facet hypertrophy greater on the right with vacuum facet. Mild right L4 foraminal stenosis. L5-S1: Mild disc bulging and endplate spurring. Mild facet hypertrophy. No significant stenosis. IMPRESSION: 1. Osteopenia, with no acute or suspicious osseous abnormality in the lumbar spine. 2. Lumbar disc and endplate degeneration. Moderate to severe facet degeneration at L4-L5 greater on the right. No significant spinal stenosis by CT. Mild or moderate neural foraminal stenosis at the left L2, right L3, and right L4 nerve levels. Electronically Signed   By: Genevie Ann M.D.   On: 03/07/2021 11:13    ____________________________________________   PROCEDURES  Procedure(s) performed (including Critical Care):  Procedures  ____________________________________________   INITIAL IMPRESSION / ASSESSMENT AND PLAN     55 year old female presenting to the emergency department for treatment and evaluation of low back pain with radiation into bilateral lower extremities.  See HPI for further details.  Initial thought was to get an MRI of the lumbar spine due to her history of cancer, however she has a pacemaker and IVCD which our MRI cannot scan.  We will get CT of the lumbar spine instead which should pick up any mass or lesion.  DIFFERENTIAL DIAGNOSIS  Acute low back pain with sciatica, metastasis  ED COURSE  CT without acute concerns.  Pain control achieved after IV Dilaudid, Decadron, and p.o. tizanidine.  Patient felt that she could stand and move around enough to get home.  She was encouraged to continue taking her prescribed pain medication.  Prescription for tizanidine sent to her pharmacy.  She was encouraged to talk to her  primary care provider on Tuesday if no improvement.  If symptoms change or worsen, she is to return to the emergency department.    ___________________________________________   FINAL CLINICAL IMPRESSION(S) / ED DIAGNOSES  Final diagnoses:  Acute bilateral low back pain with bilateral sciatica     ED Discharge Orders          Ordered    tiZANidine (ZANAFLEX) 4 MG capsule  3 times daily PRN        03/07/21 1352             Brittany Nguyen was evaluated in Emergency Department on 03/07/2021 for the symptoms described in the history of present illness. She was evaluated in the context of the global COVID-19 pandemic, which necessitated consideration that the patient might be at risk for infection with the SARS-CoV-2 virus that causes COVID-19. Institutional protocols and algorithms that pertain to the evaluation of patients at risk for COVID-19 are in a state of rapid change based on information released by regulatory bodies including the CDC and federal and state organizations. These policies and algorithms were followed during the patient's care in the ED.   Note:  This document was prepared using Dragon voice recognition software and may include unintentional dictation errors.    Victorino Dike, FNP 03/07/21 1447    Vanessa Whitten, MD 03/08/21 1316

## 2021-03-07 NOTE — ED Notes (Signed)
Patient transported to CT 

## 2021-03-19 IMAGING — CR DG CHEST 2V
2 series · 2 of 2 positions shown · non-contrast
Comparison: July 09, 2019

CLINICAL DATA: Chest pain

EXAM:
CHEST - 2 VIEW

[chest pa]
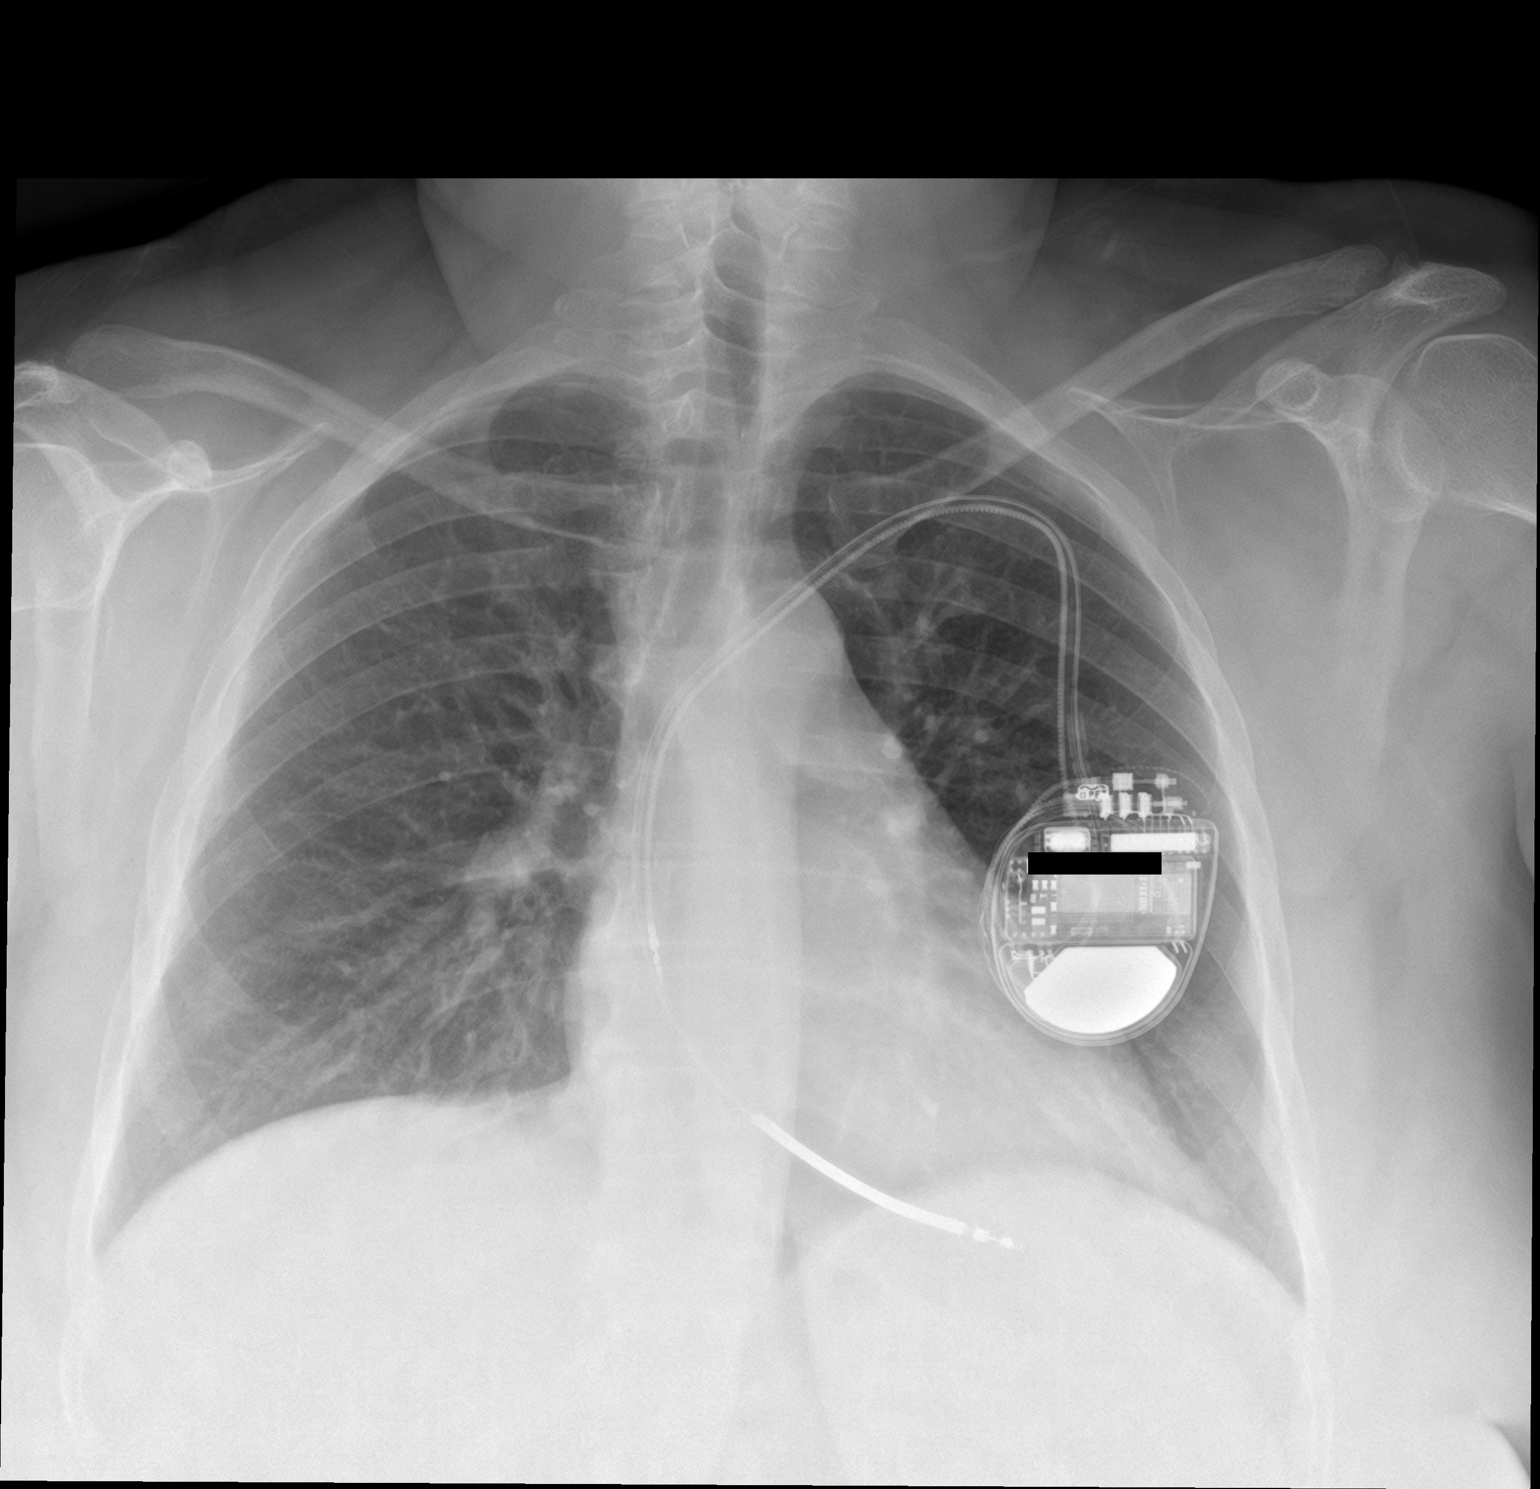

[chest lat]
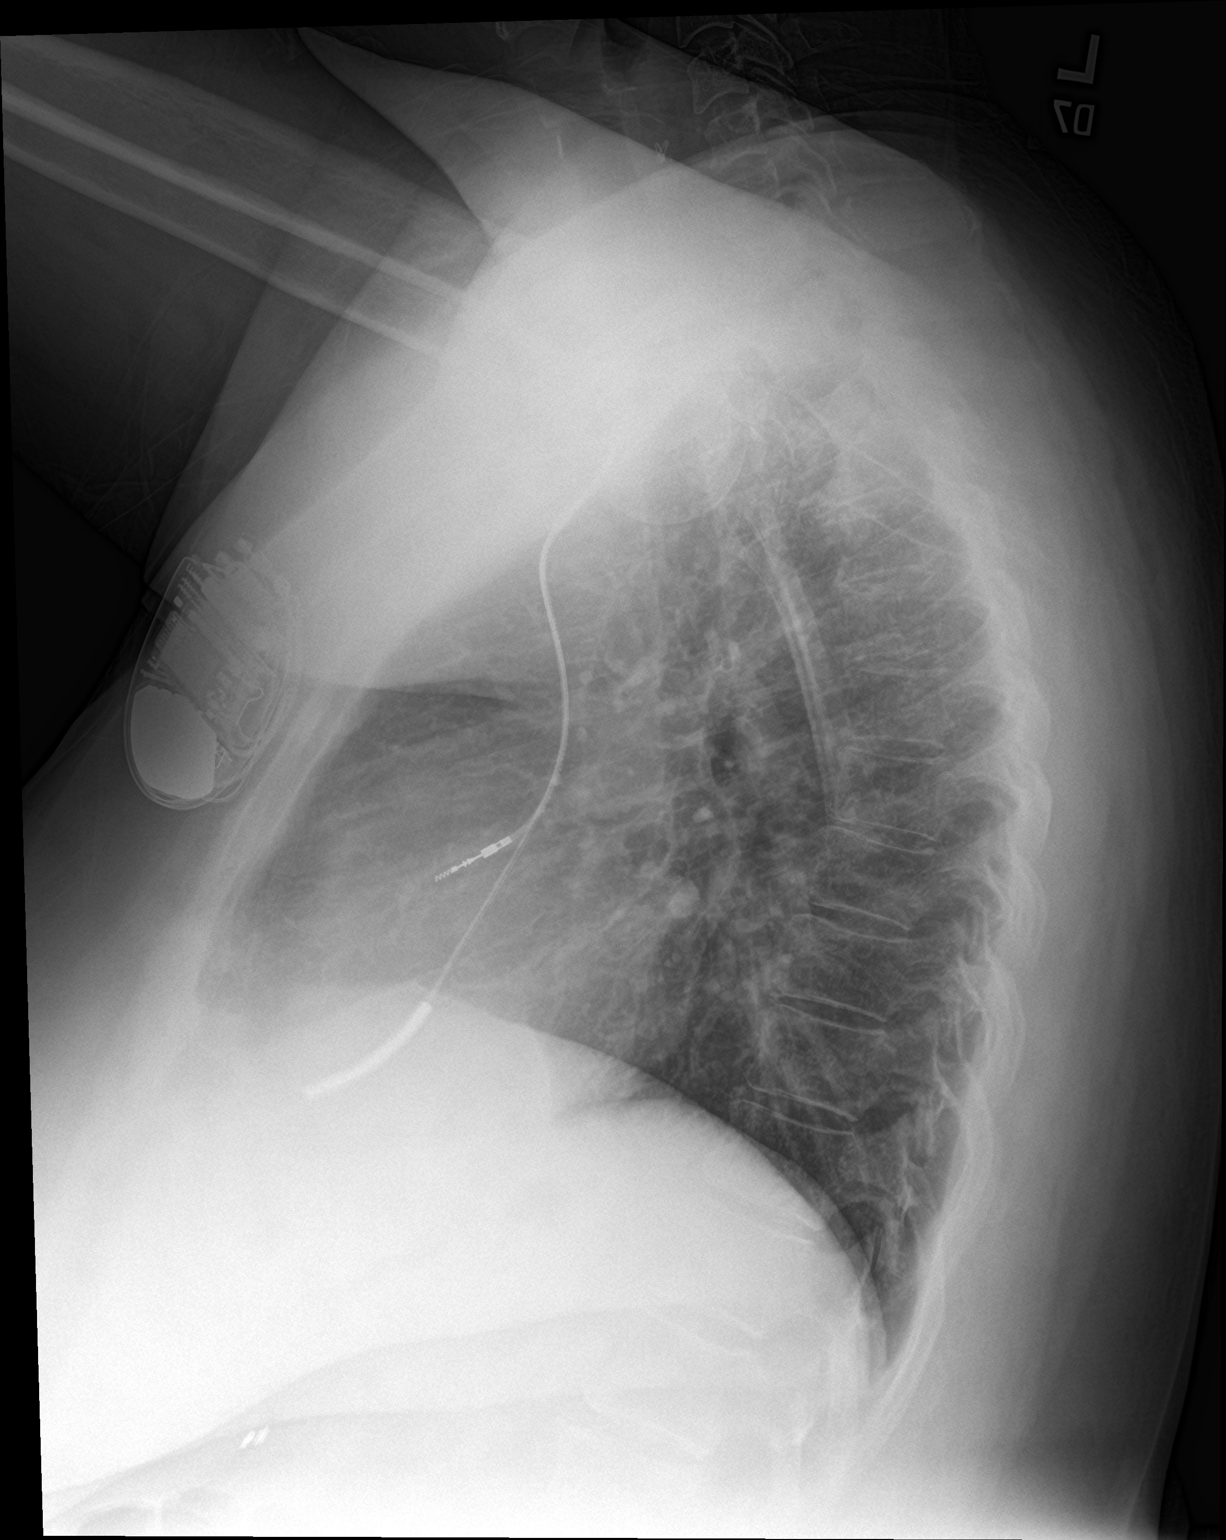

[2 of 2 positions shown; findings below may reference images not displayed]

FINDINGS: Lungs are clear. Heart size and pulmonary vascularity are within
normal limits. Pacemaker leads are attached to the right atrium and
right ventricle. No adenopathy. No pneumothorax. No bone lesions.
IMPRESSION: Pacemaker leads attached to right atrium and right ventricle. Heart
size normal. Lungs clear.

## 2021-04-09 IMAGING — CT CT HEAD W/O CM
2 of 3 series · 15 of 39 positions shown, 18 images · non-contrast
Comparison: None.

CLINICAL DATA: New onset seizures

EXAM:
CT HEAD WITHOUT CONTRAST
TECHNIQUE: Contiguous axial images were obtained from the base of the skull
through the vertex without intravenous contrast.

[Series 2: head wo · axial · 0.47mm/px · z∈[-149,-29]mm · 12 of 29 slices shown, 15 images]
[im 3/29  brain]
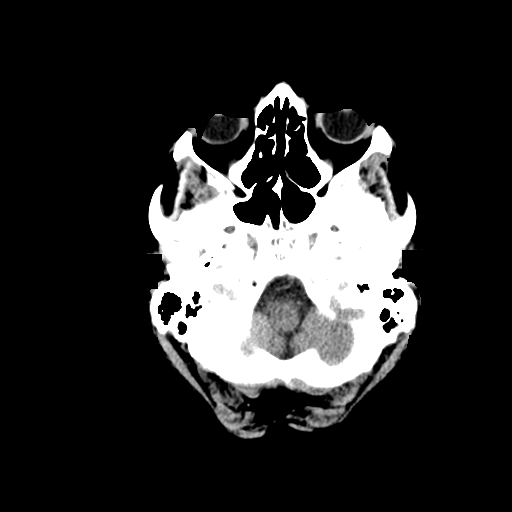
[im 3/29  bone]
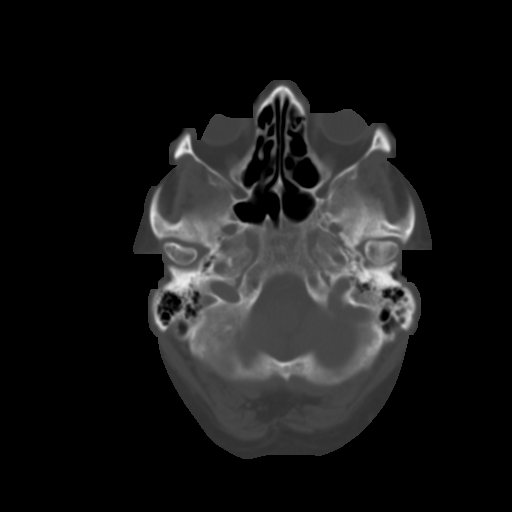
[im 5/29  brain]
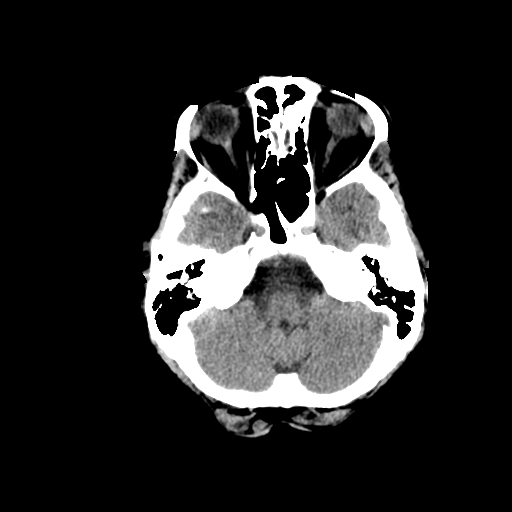
[im 7/29  brain]
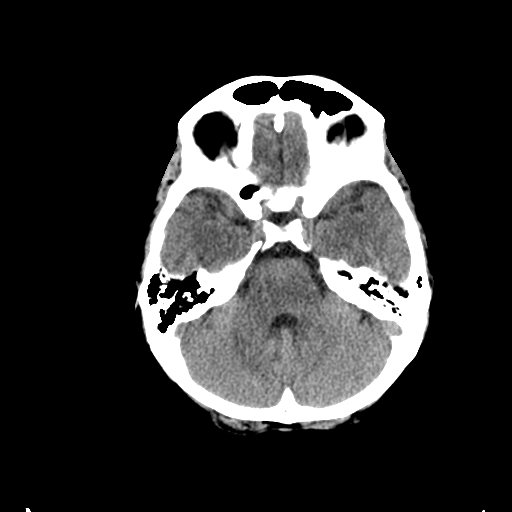
[im 9/29  brain]
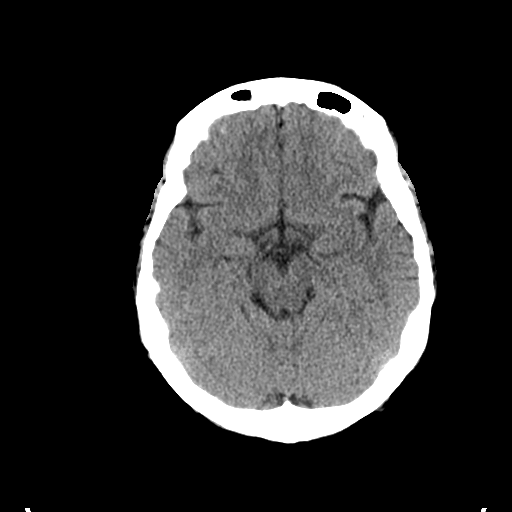
[im 12/29  brain]
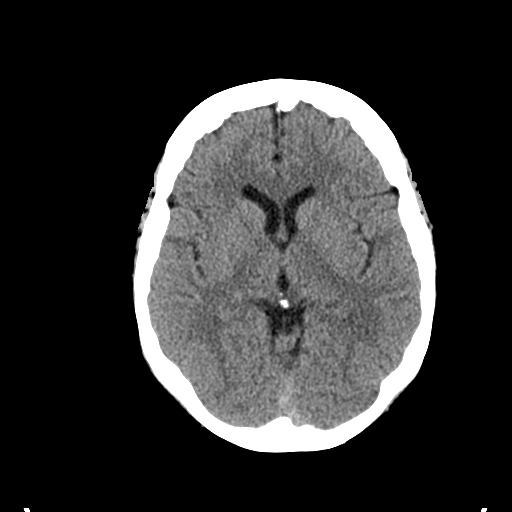
[im 12/29  bone]
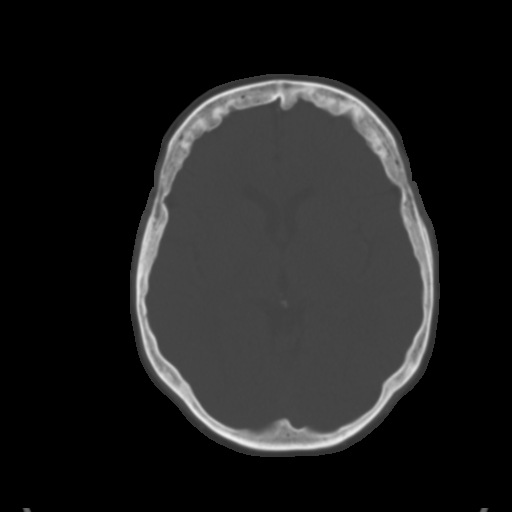
[im 14/29  brain]
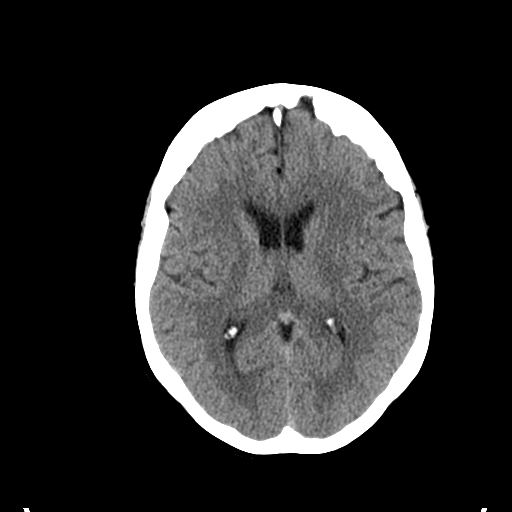
[im 16/29  brain]
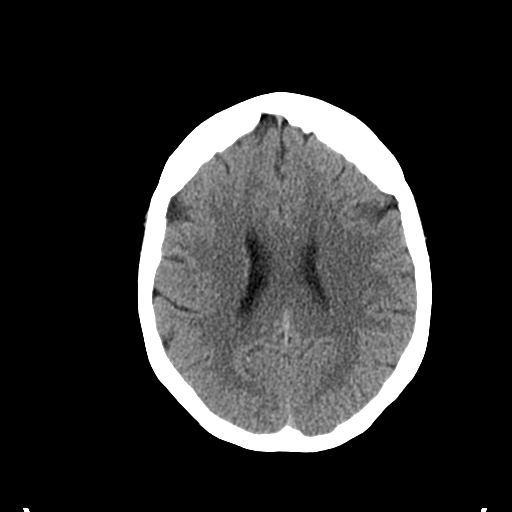
[im 18/29  brain]
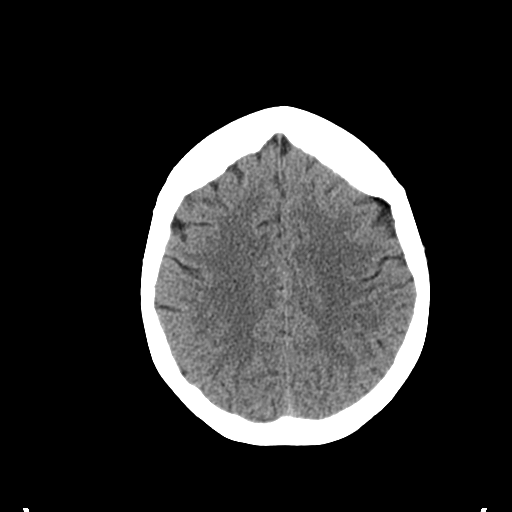
[im 21/29  brain]
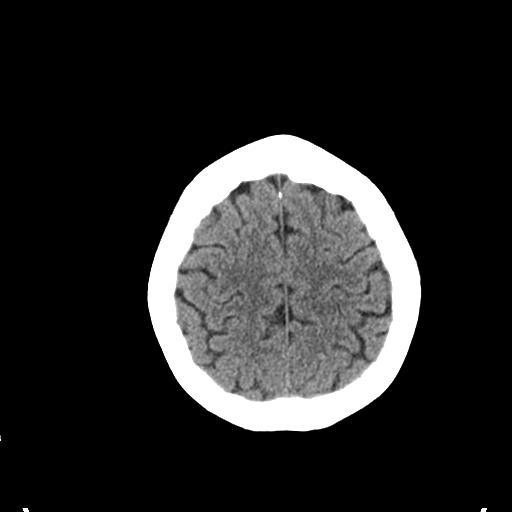
[im 21/29  bone]
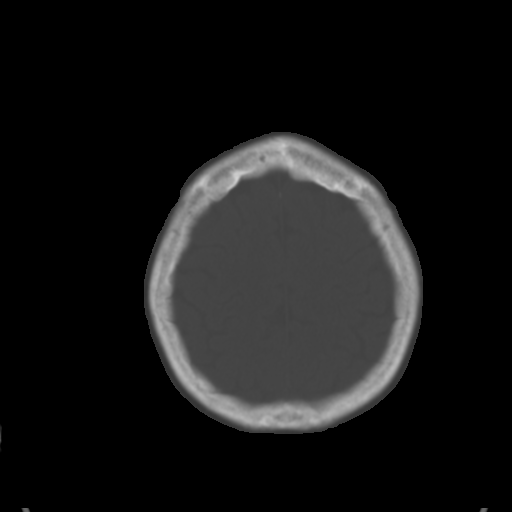
[im 23/29  brain]
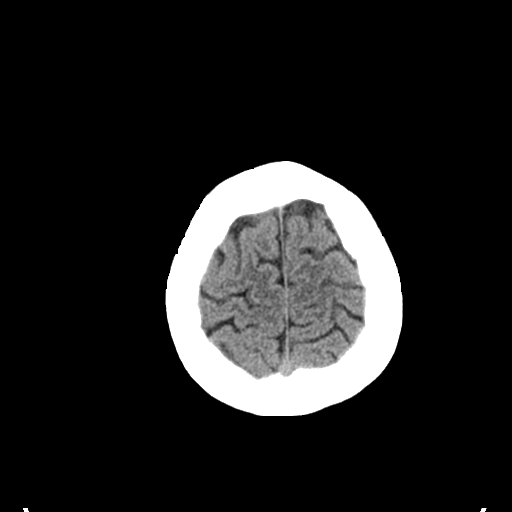
[im 25/29  brain]
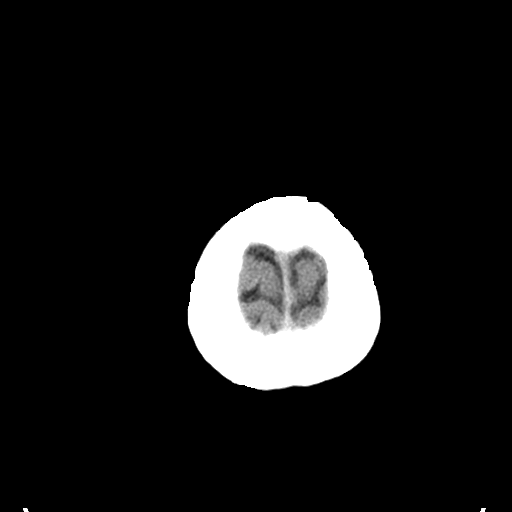
[im 27/29  brain]
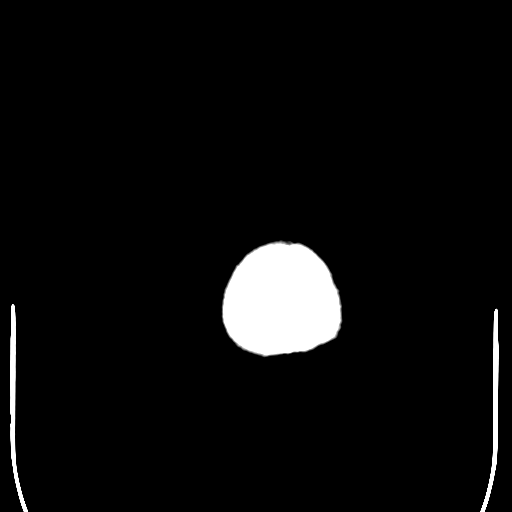

[Series 4: coronal soft tissue · coronal · 0.29mm/px · 3 of 63 slices shown]
[im 16/63  brain]
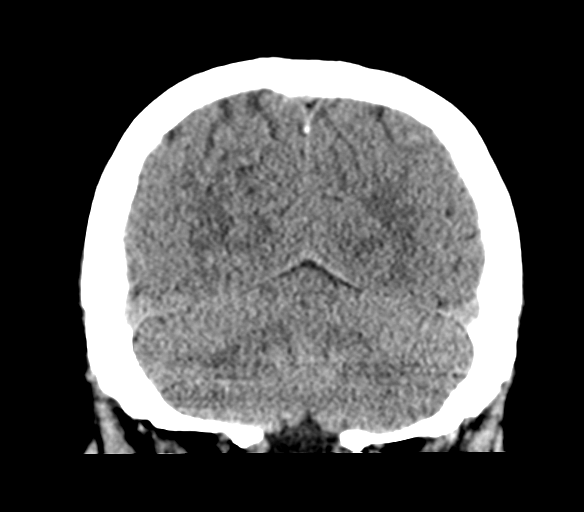
[im 32/63  brain]
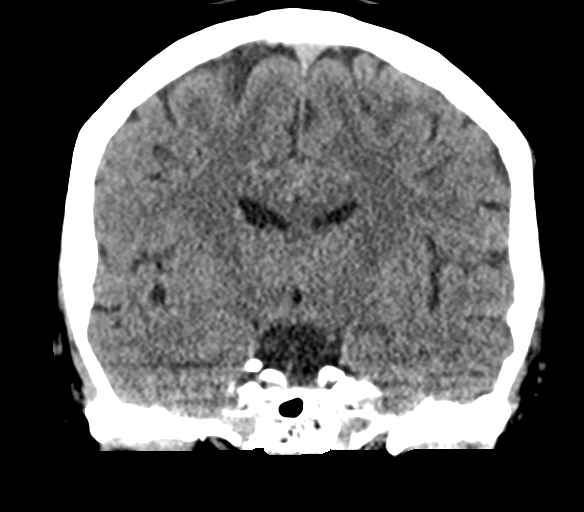
[im 47/63  brain]
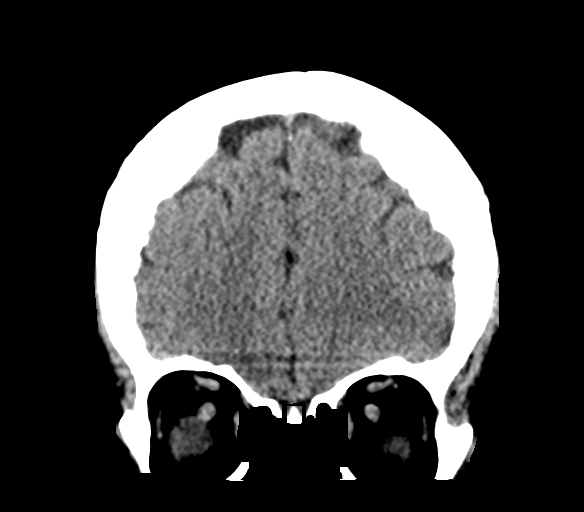

[15 of 39 positions shown; findings below may reference images not displayed]

FINDINGS: Brain: There is no mass, hemorrhage or extra-axial collection. The
size and configuration of the ventricles and extra-axial CSF spaces
are normal. The brain parenchyma is normal, without acute or chronic
infarction.

Vascular: No abnormal hyperdensity of the major intracranial
arteries or dural venous sinuses. No intracranial atherosclerosis.

Skull: The visualized skull base, calvarium and extracranial soft
tissues are normal.

Sinuses/Orbits: No fluid levels or advanced mucosal thickening of
the visualized paranasal sinuses. No mastoid or middle ear effusion.
The orbits are normal.
IMPRESSION: Normal head CT.

## 2021-04-28 IMAGING — DX DG CHEST 1V PORT
1 series · 1 of 1 positions shown · non-contrast
Comparison: Radiograph 01/29/2020

CLINICAL DATA: COVID like symptoms

EXAM:
PORTABLE CHEST 1 VIEW

[chest ap]
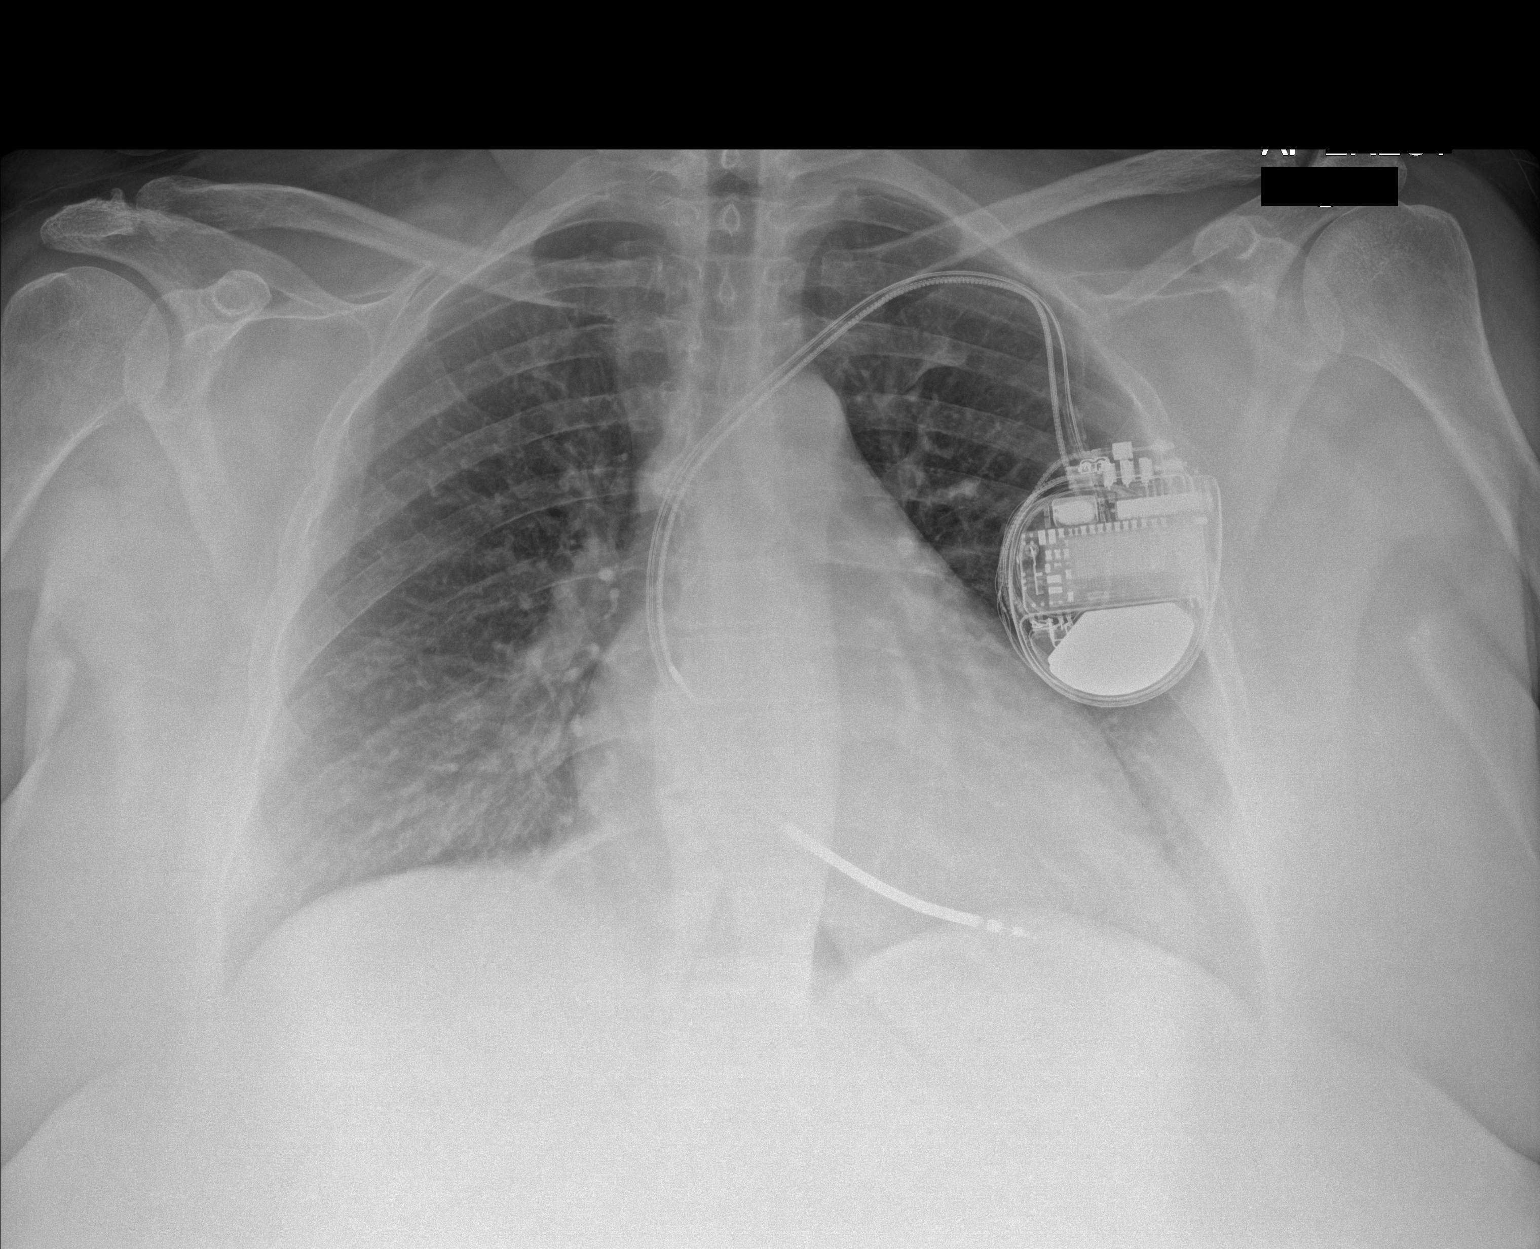

[1 of 1 positions shown; findings below may reference images not displayed]

FINDINGS: Low lung volumes with some hazy and streaky opacities in the bases
favoring atelectasis accentuated by the breast tissue. No focal
consolidative opacity. No pneumothorax or visible effusion.
Prominence of cardiomediastinal silhouette is likely related to the
portable technique. AICD battery pack overlies the left chest wall
with leads at the right atrium and cardiac apex. No acute osseous or
soft tissue abnormality.
IMPRESSION: Low lung volumes with some hazy and streaky opacities in the bases
favoring atelectasis accentuated by the body habitus though
underlying early atypical infection is difficult to exclude.

## 2021-06-15 IMAGING — CR DG CHEST 2V
1 series · 2 of 2 positions shown · non-contrast
Comparison: Chest radiograph dated 02/11/2020.

CLINICAL DATA: 54-year-old female with chest pain.

EXAM:
CHEST - 2 VIEW

[Series 1: dg chest 2 view · 0.14mm/px · 2 of 2 slices shown]
[im 1/2]
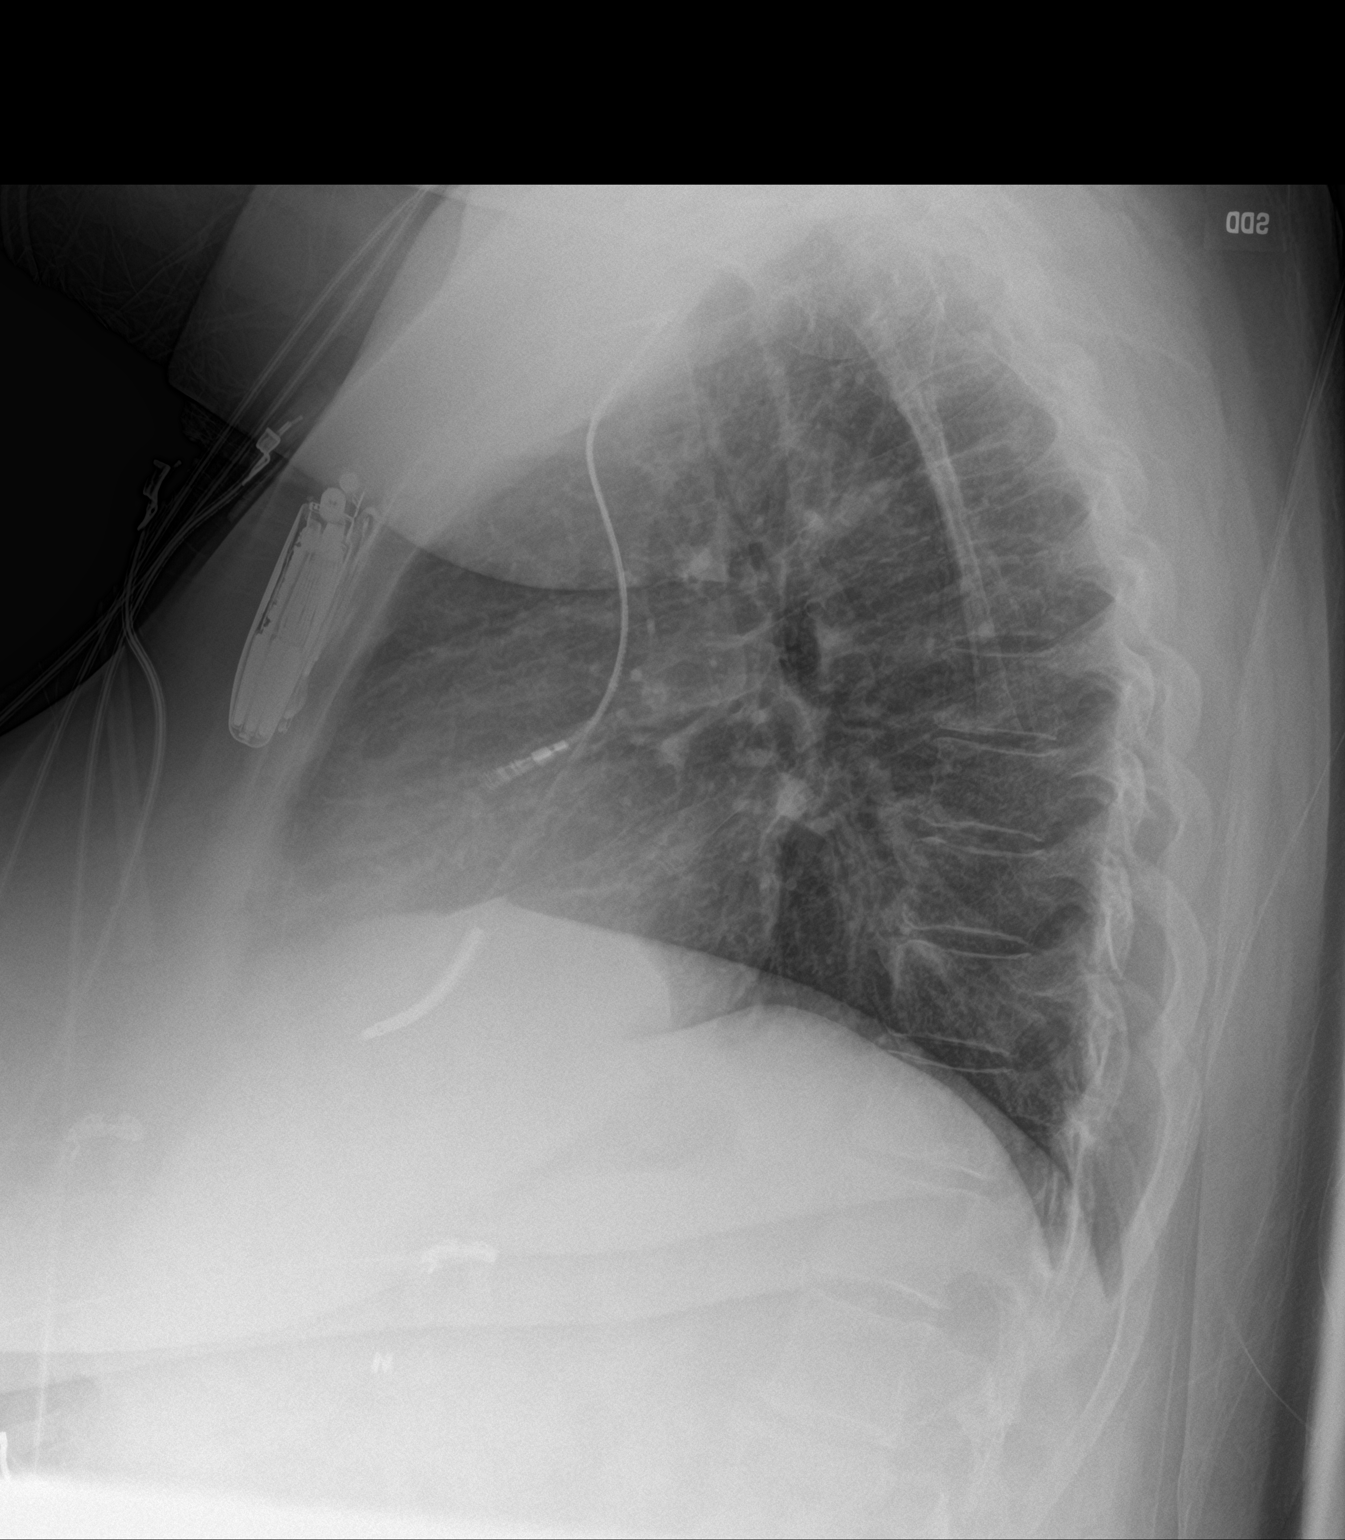
[im 2/2]
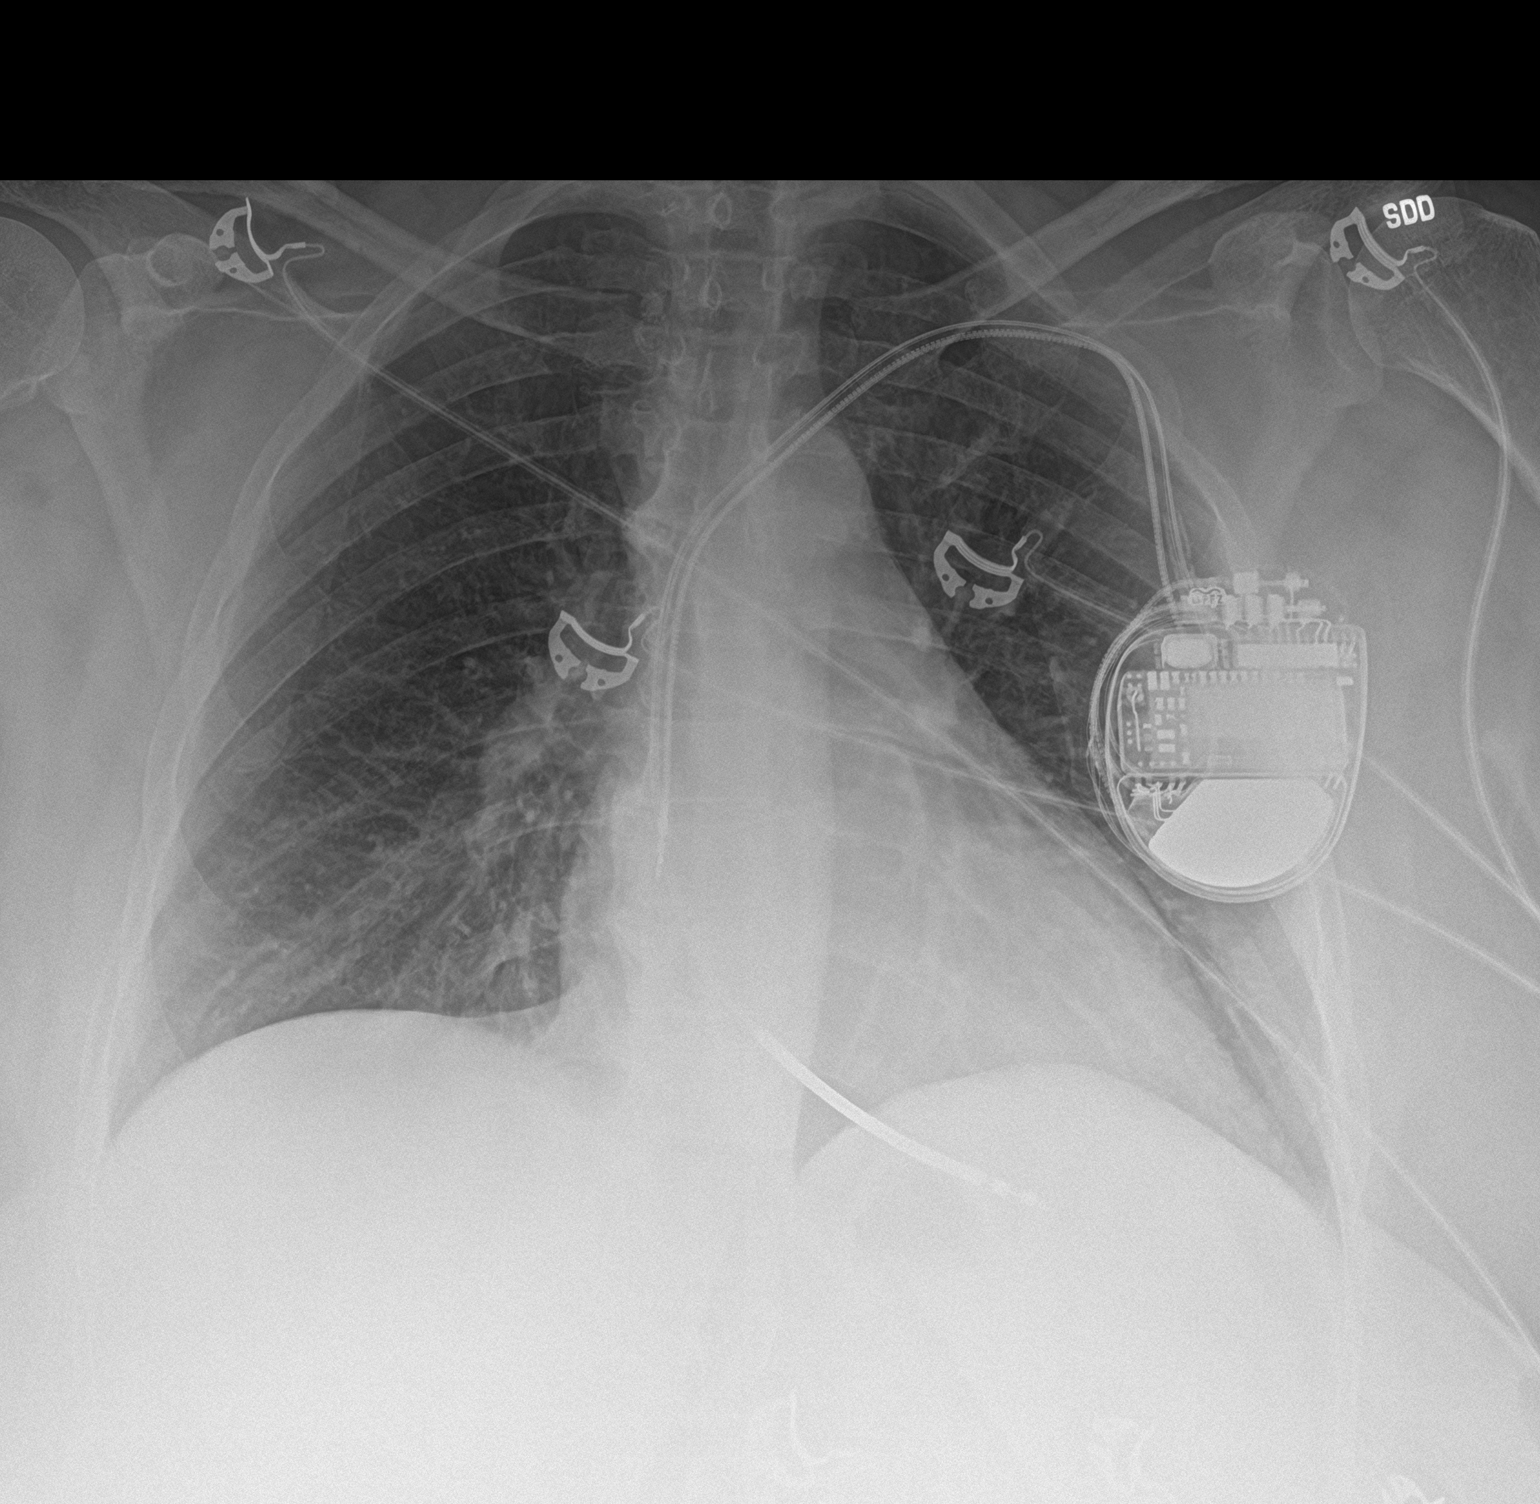

[2 of 2 positions shown; findings below may reference images not displayed]

FINDINGS: No focal consolidation, pleural effusion, pneumothorax. Mild
cardiomegaly. Left pectoral AICD device. No acute osseous pathology.
IMPRESSION: No active cardiopulmonary disease.

## 2021-06-21 ENCOUNTER — Inpatient Hospital Stay
Admission: EM | Admit: 2021-06-21 | Discharge: 2021-06-23 | DRG: 101 | Disposition: A | Discharge status: Home / Self Care | Payer: Medicare Other | Attending: Internal Medicine | Admitting: Internal Medicine

## 2021-06-21 ENCOUNTER — Emergency Department: Payer: Medicare Other

## 2021-06-21 DIAGNOSIS — E119 Type 2 diabetes mellitus without complications: Secondary | ICD-10-CM

## 2021-06-21 DIAGNOSIS — I5042 Chronic combined systolic (congestive) and diastolic (congestive) heart failure: Secondary | ICD-10-CM | POA: Diagnosis present

## 2021-06-21 DIAGNOSIS — R9431 Abnormal electrocardiogram [ECG] [EKG]: Secondary | ICD-10-CM

## 2021-06-21 DIAGNOSIS — Z90722 Acquired absence of ovaries, bilateral: Secondary | ICD-10-CM

## 2021-06-21 DIAGNOSIS — G40909 Epilepsy, unspecified, not intractable, without status epilepticus: Secondary | ICD-10-CM | POA: Diagnosis not present

## 2021-06-21 DIAGNOSIS — D3A8 Other benign neuroendocrine tumors: Secondary | ICD-10-CM | POA: Diagnosis present

## 2021-06-21 DIAGNOSIS — K529 Noninfective gastroenteritis and colitis, unspecified: Secondary | ICD-10-CM

## 2021-06-21 DIAGNOSIS — Z79899 Other long term (current) drug therapy: Secondary | ICD-10-CM

## 2021-06-21 DIAGNOSIS — E876 Hypokalemia: Secondary | ICD-10-CM

## 2021-06-21 DIAGNOSIS — Z20822 Contact with and (suspected) exposure to covid-19: Secondary | ICD-10-CM | POA: Diagnosis present

## 2021-06-21 DIAGNOSIS — R197 Diarrhea, unspecified: Secondary | ICD-10-CM

## 2021-06-21 DIAGNOSIS — Z9049 Acquired absence of other specified parts of digestive tract: Secondary | ICD-10-CM

## 2021-06-21 DIAGNOSIS — Z8614 Personal history of Methicillin resistant Staphylococcus aureus infection: Secondary | ICD-10-CM

## 2021-06-21 DIAGNOSIS — Z8249 Family history of ischemic heart disease and other diseases of the circulatory system: Secondary | ICD-10-CM

## 2021-06-21 DIAGNOSIS — E890 Postprocedural hypothyroidism: Secondary | ICD-10-CM | POA: Diagnosis present

## 2021-06-21 DIAGNOSIS — Z86718 Personal history of other venous thrombosis and embolism: Secondary | ICD-10-CM

## 2021-06-21 DIAGNOSIS — Z85028 Personal history of other malignant neoplasm of stomach: Secondary | ICD-10-CM

## 2021-06-21 DIAGNOSIS — Z79890 Hormone replacement therapy (postmenopausal): Secondary | ICD-10-CM

## 2021-06-21 DIAGNOSIS — I4891 Unspecified atrial fibrillation: Secondary | ICD-10-CM | POA: Diagnosis present

## 2021-06-21 DIAGNOSIS — R112 Nausea with vomiting, unspecified: Secondary | ICD-10-CM

## 2021-06-21 DIAGNOSIS — R569 Unspecified convulsions: Secondary | ICD-10-CM

## 2021-06-21 DIAGNOSIS — G40919 Epilepsy, unspecified, intractable, without status epilepticus: Secondary | ICD-10-CM | POA: Diagnosis present

## 2021-06-21 DIAGNOSIS — Z880 Allergy status to penicillin: Secondary | ICD-10-CM

## 2021-06-21 DIAGNOSIS — Z95810 Presence of automatic (implantable) cardiac defibrillator: Secondary | ICD-10-CM | POA: Diagnosis present

## 2021-06-21 DIAGNOSIS — I5022 Chronic systolic (congestive) heart failure: Secondary | ICD-10-CM | POA: Diagnosis present

## 2021-06-21 DIAGNOSIS — Y636 Underdosing and nonadministration of necessary drug, medicament or biological substance: Secondary | ICD-10-CM | POA: Diagnosis present

## 2021-06-21 DIAGNOSIS — Z91041 Radiographic dye allergy status: Secondary | ICD-10-CM

## 2021-06-21 DIAGNOSIS — I428 Other cardiomyopathies: Secondary | ICD-10-CM

## 2021-06-21 DIAGNOSIS — Z7952 Long term (current) use of systemic steroids: Secondary | ICD-10-CM

## 2021-06-21 DIAGNOSIS — Z7951 Long term (current) use of inhaled steroids: Secondary | ICD-10-CM

## 2021-06-21 DIAGNOSIS — Z888 Allergy status to other drugs, medicaments and biological substances status: Secondary | ICD-10-CM

## 2021-06-21 DIAGNOSIS — G2581 Restless legs syndrome: Secondary | ICD-10-CM | POA: Diagnosis present

## 2021-06-21 DIAGNOSIS — Z90710 Acquired absence of both cervix and uterus: Secondary | ICD-10-CM

## 2021-06-21 DIAGNOSIS — J449 Chronic obstructive pulmonary disease, unspecified: Secondary | ICD-10-CM | POA: Diagnosis present

## 2021-06-21 DIAGNOSIS — Z91040 Latex allergy status: Secondary | ICD-10-CM

## 2021-06-21 DIAGNOSIS — N390 Urinary tract infection, site not specified: Secondary | ICD-10-CM

## 2021-06-21 HISTORY — DX: Unspecified atrial fibrillation: I48.91

## 2021-06-21 MED ORDER — LEVETIRACETAM IN NACL 500 MG/100ML IV SOLN
500.0000 mg | Freq: Once | INTRAVENOUS | Status: AC
  Administered 2021-06-22: 500 mg via INTRAVENOUS
  Filled 2021-06-21 (×2): qty 100

## 2021-06-21 MED ORDER — DIPHENHYDRAMINE HCL 50 MG/ML IJ SOLN
25.0000 mg | Freq: Once | INTRAMUSCULAR | Status: AC
  Administered 2021-06-21: 25 mg via INTRAVENOUS
  Filled 2021-06-21: qty 1

## 2021-06-21 MED ORDER — METOCLOPRAMIDE HCL 5 MG/ML IJ SOLN
5.0000 mg | Freq: Once | INTRAMUSCULAR | Status: AC
  Administered 2021-06-21: 5 mg via INTRAVENOUS
  Filled 2021-06-21: qty 2

## 2021-06-21 MED ORDER — SODIUM CHLORIDE 0.9 % IV BOLUS
1000.0000 mL | Freq: Once | INTRAVENOUS | Status: AC
  Administered 2021-06-21: 1000 mL via INTRAVENOUS

## 2021-06-21 NOTE — ED Triage Notes (Signed)
BIBA for seizure. Pta/ox4 on arrival, with full body shivering, which per EMS is what they were told is her style of seizure. +incontinence, oral trauma. Neg trauma, pt found in bed.

## 2021-06-21 NOTE — ED Provider Notes (Signed)
[facility] Emergency Department Provider Note   ____________________________________________   Event Date/Time   First MD Initiated Contact with Patient 06/21/21 2336     (approximate)  I have reviewed the triage vital signs and the nursing notes.   HISTORY  Chief Complaint Seizures    HPI Brittany Nguyen is a 55 y.o. female brought to the ED via EMS from home status post seizure.  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  Husband called EMS for seizure activity in bed.  Husband describes shaking type seizures while patient awake; urinary incontinence noted.  Did not injure herself during the seizure tonight.  Denies fever, cough, abdominal pain, dysuria.     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[facility])     Patient Active Problem List   Diagnosis Date Noted   Breakthrough seizure ([place]) 06/22/2021   Acute gastroenteritis 06/22/2021   UTI (urinary tract infection) 06/22/2021   Non-ischemic cardiomyopathy ([place]) 01/23/2020   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/23/2020   Seizure ([place]) 01/23/2020   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 difficile colitis 01/14/2018   GIB (gastrointestinal bleeding) 01/08/2018   SOB (shortness of breath) 11/01/2017   Influenza A 10/29/2017   Acute respiratory failure ([place]) 09/24/2017   Sepsis ([place]) 07/03/2017   COPD (chronic obstructive pulmonary disease) ([place]) 04/26/2017   Community acquired pneumonia 19/41/7408   Chronic systolic heart failure ([place]) 02/03/2017   Hypotension 02/03/2017   Chest pain 12/09/2016   RLS (restless legs syndrome) 12/09/2016   Cough, persistent 11/22/2015   Hypokalemia 11/22/2015   Leukocytosis 11/22/2015   Dyspnea 11/21/2015   Respiratory distress 09/19/2015   Breast pain 01/11/2013   Hx MRSA infection    Lump or mass in breast    GOITER, MULTINODULAR 01/20/2009   HYPOGLYCEMIA, UNSPECIFIED 01/20/2009   GERD 01/20/2009   DIVERTICULITIS OF COLON 01/20/2009    Past Surgical History:  Procedure Laterality Date   ABDOMINAL HYSTERECTOMY     age 33   BREAST BIOPSY Right 2014   benign   CARDIAC DEFIBRILLATOR PLACEMENT     CARDIAC DEFIBRILLATOR PLACEMENT     CESAREAN SECTION     CHOLECYSTECTOMY     COLECTOMY     COLONOSCOPY WITH PROPOFOL N/A 02/22/2018   Procedure: COLONOSCOPY WITH PROPOFOL;  Surgeon: Toledo, Benay Pike, MD;  Location: [facility] ENDOSCOPY;  Service: Gastroenterology;  Laterality: N/A;   ESOPHAGOGASTRODUODENOSCOPY (EGD) WITH PROPOFOL N/A 02/22/2018   Procedure: ESOPHAGOGASTRODUODENOSCOPY (EGD)  WITH PROPOFOL;  Surgeon: Toledo, Benay Pike, MD;  Location: [facility] ENDOSCOPY;  Service: Gastroenterology;  Laterality: N/A;   NASAL SINUS SURGERY  2012   OOPHORECTOMY      Prior to Admission medications   Medication Sig Start Date End Date Taking? Authorizing Provider  albuterol (ACCUNEB) 1.25 MG/3ML nebulizer solution Take 3 mLs by nebulization every 6 (six) hours as needed for wheezing. 11/09/17   [provider]  albuterol (VENTOLIN HFA) 108 (90 Base) MCG/ACT inhaler Inhale 2 puffs into the lungs every 6 (six) hours as needed for shortness of breath. 08/28/16   [provider]  beclomethasone (QVAR) 80 MCG/ACT inhaler Inhale 1 puff into the lungs 2 (two) times daily. 06/12/17   [provider]  brompheniramine-pseudoephedrine-DM 30-2-10 MG/5ML syrup Take 10 mLs by mouth 4 (four) times daily as needed. 12/06/20   Cuthriell, Charline Bills, PA-C  busPIRone (BUSPAR) 15 MG tablet Take 15 mg by mouth 2 (two) times daily as needed.  05/08/18   [provider]  chlorhexidine (PERIDEX) 0.12 % solution Use as directed 10 mLs in the mouth or throat 2 (two) times daily. Swish and spit 12/06/20   Cuthriell, Charline Bills, PA-C  DULoxetine (CYMBALTA) 30 MG capsule Take 30 mg by mouth daily. 10/27/19   [provider]  ENTRESTO 24-26 MG Take 0.5 tablets by mouth 2 (two) times daily. 01/08/20   [provider]  famotidine (PEPCID) 20 MG tablet Take 1 tablet (20 mg total) by mouth 2 (two) times daily. 03/15/19   Carrie Mew, MD  FARXIGA 5 MG TABS tablet Take 5 mg by mouth daily. 11/11/19   [provider]  fentaNYL (DURAGESIC) 50 MCG/HR Place 1 patch onto the skin every 3 (three) days. 10/08/18   Epifanio Lesches, MD  furosemide (LASIX) 40 MG tablet Take 80 mg by mouth 2 (two) times daily.  07/09/18   [provider]  gabapentin (NEURONTIN) 100 MG capsule Take 100 mg by mouth at bedtime. 06/01/21   [provider]  hydrOXYzine (ATARAX/VISTARIL) 25 MG tablet Take 25 mg by mouth every 6 (six) hours as needed for itching. 04/13/19   [provider]  levothyroxine (SYNTHROID) 175 MCG tablet Take 175 mcg by mouth daily before breakfast.     [provider]  levothyroxine (SYNTHROID) 200 MCG tablet Take 200 mcg by mouth daily. 04/15/21   [provider]  LORazepam (ATIVAN) 1 MG tablet Take 1 mg by mouth 4 (four) times daily as needed.  12/20/19   [provider]  losartan (COZAAR) 25 MG tablet Take 25 mg by mouth daily.     [provider]  meloxicam (MOBIC) 15 MG tablet Take 1 tablet (15 mg total) by mouth daily. 09/24/20 09/24/21  Lannie Fields, PA-C  metoCLOPramide (REGLAN) 10 MG tablet Take 1 tablet (10 mg total) by mouth every 8 (eight) hours as needed for up to 3 days for nausea. 02/11/20 02/14/20  Rudene Re, MD  metoprolol succinate (TOPROL-XL) 50 MG 24 hr tablet Take 50 mg by mouth 2 (two) times daily. 01/25/19   [provider]  mirtazapine (REMERON) 15 MG tablet Take 15 mg by mouth at bedtime. 04/26/19 05/26/24  [provider]  morphine (MSIR) 15 MG tablet Take 15 mg by mouth as needed for severe pain.     [provider]  Multiple Vitamins-Minerals (ONE-A-DAY WOMENS PO) Take 1 tablet by mouth daily.    [provider]  nitroGLYCERIN (NITROSTAT) 0.4 MG SL tablet Place  1 tablet (0.4 mg total) under the tongue every 5 (five) minutes as needed for chest pain. 01/24/17   Henreitta Leber, MD  omeprazole (PRILOSEC) 40 MG capsule Take 40 mg by mouth 2 (two) times daily. 10/19/18   [provider]  oxyCODONE (OXY IR/ROXICODONE) 5 MG immediate release tablet Take 5-10 mg by mouth every 4 (four) hours as needed for pain. 04/11/19   [provider]  oxyCODONE-acetaminophen (PERCOCET) 5-325 MG tablet Take 1 tablet by mouth every 4 (four) hours as needed. 02/11/20   Rudene Re, MD  oxyCODONE-acetaminophen (PERCOCET/ROXICET) 5-325 MG tablet Take 1 tablet by mouth every 4 (four) hours as needed.  01/17/20   [provider]  Pancrelipase, Lip-Prot-Amyl, (CREON) 24000-76000 units CPEP Take 3-4 capsules by mouth 3 (three) times daily with meals. 04/01/19   [provider]  pantoprazole (PROTONIX) 40 MG tablet Take 40 mg by mouth 2 (two) times daily. 12/20/19   [provider]  potassium chloride (KLOR-CON) 10 MEQ tablet Take 2 tablets (20  mEq total) by mouth daily for 5 days. 09/20/20 09/25/20  Vanessa Parker, MD  potassium chloride SA (KLOR-CON M20) 20 MEQ tablet Take 1 tablet (20 mEq total) by mouth 2 (two) times daily for 7 days. 07/09/19 01/23/20  Carrie Mew, MD  predniSONE (DELTASONE) 50 MG tablet Take 1 tablet (50 mg total) by mouth daily with breakfast. 12/06/20   Cuthriell, Charline Bills, PA-C  promethazine (PHENERGAN) 25 MG tablet Take 1 tablet (25 mg total) by mouth every 6 (six) hours as needed. 07/09/19   Carrie Mew, MD  rOPINIRole (REQUIP) 4 MG tablet Take 4 mg by mouth at bedtime. 01/30/19   [provider]  spironolactone (ALDACTONE) 50 MG tablet Take 50 mg by mouth daily. 11/26/18   [provider]  sucralfate (CARAFATE) 1 GM/10ML suspension Take 10 mLs (1 g total) by mouth 4 (four) times daily. 12/06/20 12/06/21  Cuthriell, Charline Bills, PA-C  tiZANidine (ZANAFLEX) 4 MG capsule Take 1 capsule (4 mg total) by mouth 3 (three) times daily as needed for up to 30 doses for muscle spasms. 03/07/21   Triplett, Johnette Abraham B, FNP  vitamin B-12 (CYANOCOBALAMIN) 1000 MCG tablet Take 1,000 mcg by mouth daily.    [provider]    Allergies Contrast media [iodinated diagnostic agents], Lidocaine, Metrizamide, Penicillins, Isosorbide nitrate, Latex, Ondansetron, Zofran [ondansetron hcl], Povidone-iodine, and Pulmicort [budesonide]  Family History  Problem Relation Age of Onset   Cancer Mother 22       ovarian   CAD Mother    Cancer Father 17       brain   CAD Father    Cancer Daughter 86       skin   Cancer Maternal Aunt 73       breast   Leukemia Paternal Grandfather     Social History Social History   Tobacco Use   Smoking status: Never   Smokeless tobacco: Never  Vaping Use   Vaping Use: Never used  Substance Use Topics   Alcohol use: No   Drug use: Yes    Comment: prescribed fentanyl patch, morphine and phenergan    Review of Systems  Constitutional: No fever/chills Eyes: No visual  changes. ENT: No sore throat. Cardiovascular: Denies chest pain. Respiratory: Denies shortness of breath. Gastrointestinal: No abdominal pain.  Positive for nausea, vomiting and diarrhea.  No constipation. Genitourinary: Negative for dysuria. Musculoskeletal: Negative for back pain. Skin: Negative for rash. Neurological: Positive for seizure.  Negative  for headaches, focal weakness or numbness.   ____________________________________________   PHYSICAL EXAM:  VITAL SIGNS: ED Triage Vitals  Enc Vitals Group     BP      Pulse      Resp      Temp      Temp src      SpO2      Weight      Height      Head Circumference      Peak Flow      Pain Score      Pain Loc      Pain Edu?      Excl. in [place]?     Constitutional: Alert and oriented.  Chronically ill appearing and in mild acute distress. Eyes: Conjunctivae are normal. PERRL. EOMI. Head: Atraumatic. Nose: No congestion/rhinnorhea. Mouth/Throat: Mucous membranes are mildly dry. Neck: No stridor.   Cardiovascular: Normal rate, regular rhythm. Grossly normal heart sounds.  Good peripheral circulation. Respiratory: Normal respiratory effort.  No retractions. Lungs CTAB. Gastrointestinal: Soft and nontender to light or deep palpation. No distention. No abdominal bruits. No CVA tenderness. Musculoskeletal: No lower extremity tenderness nor edema.  No joint effusions. Neurologic: Delayed speech and language. No gross focal neurologic deficits are appreciated. MAEx4.  Skin:  Skin is warm, dry and intact. No rash noted.  No petechiae. Psychiatric: Mood and affect are normal. Speech and behavior are normal.  ____________________________________________   LABS (all labs ordered are listed, but only abnormal results are displayed)  Labs Reviewed  CBC WITH DIFFERENTIAL/PLATELET - Abnormal; Notable for the following components:      Result Value   WBC 12.5 (*)    All other components within normal limits  COMPREHENSIVE  METABOLIC PANEL - Abnormal; Notable for the following components:   Potassium 2.8 (*)    Glucose, Bld 123 (*)    Calcium 8.7 (*)    All other components within normal limits  URINALYSIS, COMPLETE (UACMP) WITH MICROSCOPIC - Abnormal; Notable for the following components:   Color, Urine STRAW (*)    APPearance HAZY (*)    Leukocytes,Ua MODERATE (*)    Bacteria, UA FEW (*)    All other components within normal limits  RESP PANEL BY RT-PCR (FLU A&B, COVID) ARPGX2  GROUP A STREP BY PCR  URINE CULTURE  CULTURE, BLOOD (ROUTINE X 2) W REFLEX TO ID PANEL  CULTURE, BLOOD (ROUTINE X 2) W REFLEX TO ID PANEL  LIPASE, BLOOD  LACTIC ACID, PLASMA  HIV ANTIBODY (ROUTINE TESTING W REFLEX)  HEMOGLOBIN A1C  TROPONIN I (HIGH SENSITIVITY)  TROPONIN I (HIGH SENSITIVITY)   ____________________________________________  EKG  ED ECG REPORT I, Shawntavia Saunders J, the attending physician, personally viewed and interpreted this ECG.   Date: 06/22/2021  EKG Time: 2340  Rate: 96  Rhythm: normal sinus rhythm  Axis: Normal  Intervals: QTC 555  ST&T Change: Nonspecific  ____________________________________________  RADIOLOGY I, Sal Spratley J, personally viewed and evaluated these images (plain radiographs) as part of my medical decision making, as well as reviewing the written report by the radiologist.  ED MD interpretation: No acute cardiopulmonary process; CT head negative for ICH, unremarkable CT abdomen/pelvis  Official radiology report(s): CT Abdomen Pelvis Wo Contrast  Result Date: 06/22/2021 CLINICAL DATA:  Seizure, nausea/vomiting EXAM: CT ABDOMEN AND PELVIS WITHOUT CONTRAST TECHNIQUE: Multidetector CT imaging of the abdomen and pelvis was performed following the standard protocol without IV contrast. COMPARISON:  02/26/2021 FINDINGS: Lower chest: Lung bases are clear. Hepatobiliary: Unenhanced liver is unremarkable.  Status post cholecystectomy. No intrahepatic or extrahepatic ductal dilatation.  Pancreas: Mild parenchymal atrophy. Spleen: Within normal limits. Adrenals/Urinary Tract: Adrenal glands are within normal limits. Kidneys are within normal limits. No renal calculi or hydronephrosis. Bladder is within normal limits. Stomach/Bowel: Stomach is within normal limits. No evidence of bowel obstruction. Appendix is not discretely visualized. Status post left hemicolectomy with suture line in the lower pelvis (series 2/image 62). No colonic wall thickening or inflammatory changes. Vascular/Lymphatic: No evidence of abdominal aortic aneurysm. No suspicious abdominopelvic lymphadenopathy. Reproductive: Status post hysterectomy.  No adnexal masses. Other: No abdominopelvic ascites. Musculoskeletal: Degenerative changes of the lower thoracic spine. IMPRESSION: No evidence of bowel obstruction. Appendix is not discretely visualized and may be surgically absent. Status post cholecystectomy, left hemicolectomy, and hysterectomy. Electronically Signed   By: Julian Hy M.D.   On: 06/22/2021 00:32   CT Head Wo Contrast  Result Date: 06/22/2021 CLINICAL DATA:  Seizure EXAM: CT HEAD WITHOUT CONTRAST TECHNIQUE: Contiguous axial images were obtained from the base of the skull through the vertex without intravenous contrast. COMPARISON:  01/23/2020 FINDINGS: Brain: No evidence of acute infarction, hemorrhage, hydrocephalus, extra-axial collection or mass lesion/mass effect. Vascular: No hyperdense vessel or unexpected calcification. Skull: Normal. Negative for fracture or focal lesion. Sinuses/Orbits: The visualized paranasal sinuses are essentially clear. The mastoid air cells are unopacified. Postsurgical changes related to prior FESS. 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 Other: None. IMPRESSION: No evidence of acute intracranial abnormality. Electronically Signed   By: Julian Hy M.D.   On: 06/22/2021 00:33   DG Chest Port 1 View  Result Date: 06/22/2021 CLINICAL DATA:  Seizure EXAM:  PORTABLE CHEST 1 VIEW COMPARISON:  12/06/2020 FINDINGS: Left chest cardiac device with leads in the right atrium and ventricle, unchanged. Unchanged cardiac and mediastinal contours. No focal pulmonary opacity. No pleural effusion or pneumothorax. No acute osseous abnormality. IMPRESSION: No active disease. Electronically Signed   By: Merilyn Baba M.D.   On: 06/22/2021 00:09    ____________________________________________   PROCEDURES  Procedure(s) performed (including Critical Care):  .1-3 Lead EKG Interpretation Performed by: Paulette Blanch, MD Authorized by: Paulette Blanch, MD     Interpretation: normal     ECG rate:  95   ECG rate assessment: normal     Rhythm: sinus rhythm     Ectopy: none     Conduction: normal   Comments:     Patient placed on cardiac monitor to evaluate for arrhythmias   ____________________________________________   INITIAL IMPRESSION / ASSESSMENT AND PLAN / ED COURSE  As part of my medical decision making, I reviewed the following data within the [facility] notes reviewed and incorporated, Labs reviewed, EKG interpreted, Old chart reviewed, Radiograph reviewed, Discussed with admitting physician, and Notes from prior ED visits     55 year old female presenting with seizure and postictal state in the setting of nausea/vomiting/diarrhea. Differential diagnosis includes, but is not limited to, ovarian cyst, ovarian torsion, acute appendicitis, diverticulitis, urinary tract infection/pyelonephritis, endometriosis, bowel obstruction, colitis, renal colic, gastroenteritis, hernia, fibroids, endometriosis, etc.   Will obtain lab work, CT head, CT abdomen/pelvis.  Initiate IV fluid resuscitation, IV Keppra, IV Reglan/Benadryl for nausea/vomiting.  Will reassess.  Clinical Course as of 06/22/21 0500  Tue Jun 22, 2021  0128 Nausea persists.  Complains of headache.  Will redose medications and attempt PO challenge.  Administer IV potassium,  IV antibiotic. [JS]  0246 Refractory nausea, dry heaves.  Would prefer to give IV  Phenergan but will hold given patient's prolonged QTC.  We will instead administer another dose IV Benadryl.  Will discuss with hospitalist services for admission. [JS]    Clinical Course User Index [JS] Paulette Blanch, MD     ____________________________________________   FINAL CLINICAL IMPRESSION(S) / ED DIAGNOSES  Final diagnoses:  Seizure ([place])  Nausea vomiting and diarrhea  Long QT interval  Hypokalemia  Lower urinary tract infectious disease     ED Discharge Orders     None        Note:  This document was prepared using Dragon voice recognition software and may include unintentional dictation errors.    Paulette Blanch, MD 06/22/21 [phone]

## 2021-06-22 ENCOUNTER — Emergency Department: Payer: Medicare Other

## 2021-06-22 DIAGNOSIS — Z8249 Family history of ischemic heart disease and other diseases of the circulatory system: Secondary | ICD-10-CM | POA: Diagnosis not present

## 2021-06-22 DIAGNOSIS — K529 Noninfective gastroenteritis and colitis, unspecified: Secondary | ICD-10-CM | POA: Diagnosis present

## 2021-06-22 DIAGNOSIS — E119 Type 2 diabetes mellitus without complications: Secondary | ICD-10-CM | POA: Diagnosis present

## 2021-06-22 DIAGNOSIS — I4891 Unspecified atrial fibrillation: Secondary | ICD-10-CM | POA: Diagnosis present

## 2021-06-22 DIAGNOSIS — Y636 Underdosing and nonadministration of necessary drug, medicament or biological substance: Secondary | ICD-10-CM | POA: Diagnosis present

## 2021-06-22 DIAGNOSIS — J449 Chronic obstructive pulmonary disease, unspecified: Secondary | ICD-10-CM | POA: Diagnosis present

## 2021-06-22 DIAGNOSIS — E1169 Type 2 diabetes mellitus with other specified complication: Secondary | ICD-10-CM | POA: Diagnosis not present

## 2021-06-22 DIAGNOSIS — D3A8 Other benign neuroendocrine tumors: Secondary | ICD-10-CM | POA: Diagnosis present

## 2021-06-22 DIAGNOSIS — Z86718 Personal history of other venous thrombosis and embolism: Secondary | ICD-10-CM | POA: Diagnosis not present

## 2021-06-22 DIAGNOSIS — Z20822 Contact with and (suspected) exposure to covid-19: Secondary | ICD-10-CM | POA: Diagnosis present

## 2021-06-22 DIAGNOSIS — N390 Urinary tract infection, site not specified: Secondary | ICD-10-CM

## 2021-06-22 DIAGNOSIS — G2581 Restless legs syndrome: Secondary | ICD-10-CM | POA: Diagnosis present

## 2021-06-22 DIAGNOSIS — Z7952 Long term (current) use of systemic steroids: Secondary | ICD-10-CM | POA: Diagnosis not present

## 2021-06-22 DIAGNOSIS — Z7951 Long term (current) use of inhaled steroids: Secondary | ICD-10-CM | POA: Diagnosis not present

## 2021-06-22 DIAGNOSIS — Z85028 Personal history of other malignant neoplasm of stomach: Secondary | ICD-10-CM | POA: Diagnosis not present

## 2021-06-22 DIAGNOSIS — Z90710 Acquired absence of both cervix and uterus: Secondary | ICD-10-CM | POA: Diagnosis not present

## 2021-06-22 DIAGNOSIS — Z79890 Hormone replacement therapy (postmenopausal): Secondary | ICD-10-CM | POA: Diagnosis not present

## 2021-06-22 DIAGNOSIS — G40919 Epilepsy, unspecified, intractable, without status epilepticus: Secondary | ICD-10-CM | POA: Diagnosis not present

## 2021-06-22 DIAGNOSIS — Z79899 Other long term (current) drug therapy: Secondary | ICD-10-CM | POA: Diagnosis not present

## 2021-06-22 DIAGNOSIS — E876 Hypokalemia: Secondary | ICD-10-CM | POA: Diagnosis present

## 2021-06-22 DIAGNOSIS — Z95810 Presence of automatic (implantable) cardiac defibrillator: Secondary | ICD-10-CM | POA: Diagnosis not present

## 2021-06-22 DIAGNOSIS — E890 Postprocedural hypothyroidism: Secondary | ICD-10-CM | POA: Diagnosis present

## 2021-06-22 DIAGNOSIS — Z8614 Personal history of Methicillin resistant Staphylococcus aureus infection: Secondary | ICD-10-CM | POA: Diagnosis not present

## 2021-06-22 DIAGNOSIS — G40909 Epilepsy, unspecified, not intractable, without status epilepticus: Secondary | ICD-10-CM | POA: Diagnosis present

## 2021-06-22 DIAGNOSIS — Z90722 Acquired absence of ovaries, bilateral: Secondary | ICD-10-CM | POA: Diagnosis not present

## 2021-06-22 DIAGNOSIS — I5022 Chronic systolic (congestive) heart failure: Secondary | ICD-10-CM | POA: Diagnosis present

## 2021-06-22 DIAGNOSIS — Z9049 Acquired absence of other specified parts of digestive tract: Secondary | ICD-10-CM | POA: Diagnosis not present

## 2021-06-22 DIAGNOSIS — I428 Other cardiomyopathies: Secondary | ICD-10-CM | POA: Diagnosis present

## 2021-06-22 LAB — URINALYSIS, COMPLETE (UACMP) WITH MICROSCOPIC
Bilirubin Urine: NEGATIVE
Glucose, UA: NEGATIVE mg/dL
Hgb urine dipstick: NEGATIVE
Ketones, ur: NEGATIVE mg/dL
Nitrite: NEGATIVE
Protein, ur: NEGATIVE mg/dL
Specific Gravity, Urine: 1.005 (ref 1.005–1.030)
pH: 7 (ref 5.0–8.0)

## 2021-06-22 LAB — CBC WITH DIFFERENTIAL/PLATELET
Abs Immature Granulocytes: 0.05 10*3/uL (ref 0.00–0.07)
Basophils Absolute: 0.1 10*3/uL (ref 0.0–0.1)
Basophils Relative: 1 %
Eosinophils Absolute: 0.2 10*3/uL (ref 0.0–0.5)
Eosinophils Relative: 2 %
HCT: 40.5 % (ref 36.0–46.0)
Hemoglobin: 14.1 g/dL (ref 12.0–15.0)
Immature Granulocytes: 0 %
Lymphocytes Relative: 31 %
Lymphs Abs: 3.8 10*3/uL (ref 0.7–4.0)
MCH: 30.3 pg (ref 26.0–34.0)
MCHC: 34.8 g/dL (ref 30.0–36.0)
MCV: 87.1 fL (ref 80.0–100.0)
Monocytes Absolute: 0.7 10*3/uL (ref 0.1–1.0)
Monocytes Relative: 5 %
Neutro Abs: 7.7 10*3/uL (ref 1.7–7.7)
Neutrophils Relative %: 61 %
Platelets: 283 10*3/uL (ref 150–400)
RBC: 4.65 MIL/uL (ref 3.87–5.11)
RDW: 12.9 % (ref 11.5–15.5)
WBC: 12.5 10*3/uL — ABNORMAL HIGH (ref 4.0–10.5)
nRBC: 0 % (ref 0.0–0.2)

## 2021-06-22 LAB — RESP PANEL BY RT-PCR (FLU A&B, COVID) ARPGX2
Influenza A by PCR: NEGATIVE
Influenza B by PCR: NEGATIVE
SARS Coronavirus 2 by RT PCR: NEGATIVE

## 2021-06-22 LAB — COMPREHENSIVE METABOLIC PANEL
ALT: 23 U/L (ref 0–44)
AST: 25 U/L (ref 15–41)
Albumin: 4.1 g/dL (ref 3.5–5.0)
Alkaline Phosphatase: 108 U/L (ref 38–126)
Anion gap: 10 (ref 5–15)
BUN: 13 mg/dL (ref 6–20)
CO2: 30 mmol/L (ref 22–32)
Calcium: 8.7 mg/dL — ABNORMAL LOW (ref 8.9–10.3)
Chloride: 98 mmol/L (ref 98–111)
Creatinine, Ser: 0.96 mg/dL (ref 0.44–1.00)
GFR, Estimated: >60 mL/min (ref >60)
Glucose, Bld: 123 mg/dL — ABNORMAL HIGH (ref 70–99)
Potassium: 2.8 mmol/L — ABNORMAL LOW (ref 3.5–5.1)
Sodium: 138 mmol/L (ref 135–145)
Total Bilirubin: 0.3 mg/dL (ref 0.3–1.2)
Total Protein: 7.5 g/dL (ref 6.5–8.1)

## 2021-06-22 LAB — MAGNESIUM: Magnesium: 2.2 mg/dL (ref 1.7–2.4)

## 2021-06-22 LAB — URINE CULTURE: Culture: NO GROWTH

## 2021-06-22 LAB — LACTIC ACID, PLASMA: Lactic Acid, Venous: 1.9 mmol/L (ref 0.5–1.9)

## 2021-06-22 LAB — GROUP A STREP BY PCR: Group A Strep by PCR: NOT DETECTED

## 2021-06-22 LAB — CBG MONITORING, ED
Glucose-Capillary: 104 mg/dL — ABNORMAL HIGH (ref 70–99)
Glucose-Capillary: 136 mg/dL — ABNORMAL HIGH (ref 70–99)

## 2021-06-22 LAB — TROPONIN I (HIGH SENSITIVITY)
Troponin I (High Sensitivity): 4 ng/L (ref <18)
Troponin I (High Sensitivity): 3 ng/L (ref <18)

## 2021-06-22 LAB — POTASSIUM: Potassium: 4.2 mmol/L (ref 3.5–5.1)

## 2021-06-22 LAB — HIV ANTIBODY (ROUTINE TESTING W REFLEX): HIV Screen 4th Generation wRfx: NONREACTIVE

## 2021-06-22 LAB — LIPASE, BLOOD: Lipase: 33 U/L (ref 11–51)

## 2021-06-22 MED ORDER — ZONISAMIDE 100 MG PO CAPS
100.0000 mg | ORAL_CAPSULE | Freq: Two times a day (BID) | ORAL | Status: DC
  Administered 2021-06-22 – 2021-06-23 (×2): 100 mg via ORAL
  Filled 2021-06-22 (×3): qty 1

## 2021-06-22 MED ORDER — LEVOTHYROXINE SODIUM 50 MCG PO TABS
200.0000 ug | ORAL_TABLET | Freq: Every day | ORAL | Status: DC
  Administered 2021-06-23: 200 ug via ORAL
  Filled 2021-06-22: qty 4

## 2021-06-22 MED ORDER — ACETAMINOPHEN 325 MG RE SUPP: 650.0000 mg | RECTAL | Status: DC | PRN

## 2021-06-22 MED ORDER — GABAPENTIN 100 MG PO CAPS
100.0000 mg | ORAL_CAPSULE | Freq: Every day | ORAL | Status: DC
  Administered 2021-06-22: 100 mg via ORAL
  Filled 2021-06-22: qty 1

## 2021-06-22 MED ORDER — ALBUTEROL SULFATE (2.5 MG/3ML) 0.083% IN NEBU: 3.0000 mL | INHALATION_SOLUTION | Freq: Four times a day (QID) | RESPIRATORY_TRACT | Status: DC | PRN

## 2021-06-22 MED ORDER — DIPHENHYDRAMINE HCL 50 MG/ML IJ SOLN
25.0000 mg | Freq: Once | INTRAMUSCULAR | Status: AC
  Administered 2021-06-22: 25 mg via INTRAVENOUS
  Filled 2021-06-22: qty 1

## 2021-06-22 MED ORDER — POTASSIUM CHLORIDE IN NACL 20-0.9 MEQ/L-% IV SOLN
INTRAVENOUS | Status: DC
  Filled 2021-06-22: qty 1000

## 2021-06-22 MED ORDER — PANTOPRAZOLE SODIUM 40 MG IV SOLR
40.0000 mg | Freq: Every day | INTRAVENOUS | Status: DC
  Administered 2021-06-22 – 2021-06-23 (×2): 40 mg via INTRAVENOUS
  Filled 2021-06-22 (×2): qty 40

## 2021-06-22 MED ORDER — LORAZEPAM 2 MG/ML IJ SOLN: 1.0000 mg | INTRAMUSCULAR | Status: DC | PRN

## 2021-06-22 MED ORDER — BACID PO TABS
2.0000 | ORAL_TABLET | Freq: Three times a day (TID) | ORAL | Status: DC
  Filled 2021-06-22 (×2): qty 2

## 2021-06-22 MED ORDER — SODIUM CHLORIDE 0.9 % IV SOLN
1.0000 g | Freq: Once | INTRAVENOUS | Status: AC
  Administered 2021-06-22: 1 g via INTRAVENOUS
  Filled 2021-06-22: qty 10

## 2021-06-22 MED ORDER — OXYCODONE-ACETAMINOPHEN 5-325 MG PO TABS
1.0000 | ORAL_TABLET | ORAL | Status: DC | PRN
  Filled 2021-06-22: qty 1

## 2021-06-22 MED ORDER — PROCHLORPERAZINE EDISYLATE 10 MG/2ML IJ SOLN
10.0000 mg | Freq: Four times a day (QID) | INTRAMUSCULAR | Status: DC | PRN
  Administered 2021-06-22: 10 mg via INTRAVENOUS
  Filled 2021-06-22 (×2): qty 2

## 2021-06-22 MED ORDER — POTASSIUM CHLORIDE 10 MEQ/100ML IV SOLN
10.0000 meq | Freq: Once | INTRAVENOUS | Status: DC
  Filled 2021-06-22: qty 100

## 2021-06-22 MED ORDER — FAMOTIDINE 20 MG PO TABS
20.0000 mg | ORAL_TABLET | Freq: Two times a day (BID) | ORAL | Status: DC
  Administered 2021-06-22 – 2021-06-23 (×2): 20 mg via ORAL
  Filled 2021-06-22 (×2): qty 1

## 2021-06-22 MED ORDER — INSULIN ASPART 100 UNIT/ML IJ SOLN: 0.0000 [IU] | Freq: Every day | INTRAMUSCULAR | Status: DC

## 2021-06-22 MED ORDER — POTASSIUM CHLORIDE 10 MEQ/100ML IV SOLN
10.0000 meq | Freq: Once | INTRAVENOUS | Status: AC
  Administered 2021-06-22: 10 meq via INTRAVENOUS

## 2021-06-22 MED ORDER — TIZANIDINE HCL 2 MG PO TABS: 4.0000 mg | ORAL_TABLET | Freq: Three times a day (TID) | ORAL | Status: DC

## 2021-06-22 MED ORDER — LEVETIRACETAM 500 MG PO TABS
500.0000 mg | ORAL_TABLET | Freq: Two times a day (BID) | ORAL | Status: DC
  Administered 2021-06-22 – 2021-06-23 (×2): 500 mg via ORAL
  Filled 2021-06-22 (×2): qty 1

## 2021-06-22 MED ORDER — INSULIN ASPART 100 UNIT/ML IJ SOLN: 0.0000 [IU] | Freq: Three times a day (TID) | INTRAMUSCULAR | Status: DC

## 2021-06-22 MED ORDER — POTASSIUM CHLORIDE 10 MEQ/100ML IV SOLN
10.0000 meq | Freq: Once | INTRAVENOUS | Status: DC
  Administered 2021-06-22: 10 meq via INTRAVENOUS
  Filled 2021-06-22: qty 100

## 2021-06-22 MED ORDER — ACETAMINOPHEN 325 MG PO TABS: 650.0000 mg | ORAL_TABLET | ORAL | Status: DC | PRN

## 2021-06-22 MED ORDER — POTASSIUM CHLORIDE 10 MEQ/100ML IV SOLN
10.0000 meq | Freq: Once | INTRAVENOUS | Status: AC
  Administered 2021-06-22: 10 meq via INTRAVENOUS
  Filled 2021-06-22: qty 100

## 2021-06-22 MED ORDER — ENOXAPARIN SODIUM 40 MG/0.4ML IJ SOSY: 40.0000 mg | PREFILLED_SYRINGE | INTRAMUSCULAR | Status: DC

## 2021-06-22 MED ORDER — RISAQUAD PO CAPS
2.0000 | ORAL_CAPSULE | Freq: Three times a day (TID) | ORAL | Status: DC
  Administered 2021-06-22 – 2021-06-23 (×2): 2 via ORAL
  Filled 2021-06-22 (×2): qty 2

## 2021-06-22 MED ORDER — LEVETIRACETAM IN NACL 500 MG/100ML IV SOLN
500.0000 mg | Freq: Two times a day (BID) | INTRAVENOUS | Status: DC
  Administered 2021-06-22: 500 mg via INTRAVENOUS
  Filled 2021-06-22 (×3): qty 100

## 2021-06-22 MED ORDER — METOCLOPRAMIDE HCL 5 MG/ML IJ SOLN
5.0000 mg | Freq: Once | INTRAMUSCULAR | Status: AC
  Administered 2021-06-22: 5 mg via INTRAVENOUS
  Filled 2021-06-22: qty 2

## 2021-06-22 MED ORDER — LOSARTAN POTASSIUM 50 MG PO TABS
25.0000 mg | ORAL_TABLET | Freq: Every day | ORAL | Status: DC
  Administered 2021-06-22 – 2021-06-23 (×2): 25 mg via ORAL
  Filled 2021-06-22 (×2): qty 1

## 2021-06-22 MED ORDER — FUROSEMIDE 40 MG PO TABS: 80.0000 mg | ORAL_TABLET | Freq: Two times a day (BID) | ORAL | Status: DC

## 2021-06-22 MED ORDER — KETOROLAC TROMETHAMINE 30 MG/ML IJ SOLN
15.0000 mg | Freq: Once | INTRAMUSCULAR | Status: AC
  Administered 2021-06-22: 15 mg via INTRAVENOUS
  Filled 2021-06-22: qty 1

## 2021-06-22 MED ORDER — ROPINIROLE HCL 1 MG PO TABS
4.0000 mg | ORAL_TABLET | Freq: Every day | ORAL | Status: DC
  Administered 2021-06-22: 4 mg via ORAL
  Filled 2021-06-22 (×2): qty 4

## 2021-06-22 MED ORDER — LACTATED RINGERS IV SOLN: INTRAVENOUS | Status: DC

## 2021-06-22 MED ORDER — FENTANYL 50 MCG/HR TD PT72
1.0000 | MEDICATED_PATCH | TRANSDERMAL | Status: DC
Start: 2021-06-24 — End: 2021-06-23

## 2021-06-22 MED ORDER — SPIRONOLACTONE 25 MG PO TABS
50.0000 mg | ORAL_TABLET | Freq: Every day | ORAL | Status: DC
  Administered 2021-06-22 – 2021-06-23 (×2): 50 mg via ORAL
  Filled 2021-06-22 (×2): qty 2

## 2021-06-22 MED ORDER — FUROSEMIDE 40 MG PO TABS
40.0000 mg | ORAL_TABLET | Freq: Two times a day (BID) | ORAL | Status: DC
  Administered 2021-06-22 – 2021-06-23 (×2): 40 mg via ORAL
  Filled 2021-06-22 (×2): qty 1

## 2021-06-22 NOTE — ED Notes (Signed)
Dr. Damita Dunnings in to admit, pt resting comfortably with eyes closed.

## 2021-06-22 NOTE — Progress Notes (Signed)
PHARMACY -  BRIEF ANTIBIOTIC NOTE   Pharmacy has received consult(s) for Ceftriaxone for UTI from an ED provider.  The patient's profile has been reviewed for ht/wt/allergies/indication/available labs.  Pt w/ PCN allergy causing swelling from 2010.  Med hx shows pt given Keflex in 2019.  One time order(s) placed for Ceftriaxone  1 gm  Further antibiotics/pharmacy consults should be ordered by admitting physician if indicated.                       Thank you, Renda Rolls, PharmD, [facility] 06/22/2021 1:44 AM

## 2021-06-22 NOTE — Progress Notes (Signed)
Patient ID: Brittany Nguyen, female   DOB: April 27, 1966, 55 y.o.   MRN: [id number] This is a no charge note as patient was admitted this AM. H&P reviewed patient seen and examined.  Brittany Nguyen is a 55 y.o. female with medical history significant for Systolic CHF secondary to nonischemic cardiomyopathy s/p AICD, COPD, carcinoid tumor, diabetes, surgical hypothyroidism seizure disorder on Keppra, who presents to the ED following a seizure at home.  Patient remained awake during the seizure.  Patient has been having vomiting and diarrhea for the past 4 days and has been unable to take her medication or has been vomiting the meds out she has had no fever or chills, no cough, chest pain or shortness of breath and no abdominal pain. Soft blood pressures in the ED with systolic BP 35-00 but heart rate in the 80s and otherwise normal vitals Blood work with leukocytosis of 12,000 and lactic acid 1.9.  Potassium 2.8.  Moderate leukocyte esterase on urinalysis.  Lipase and LFTs within normal limits.  Creatinine normal.  Troponin normal at 4. EKG sinus rhythm with prolonged QT CT abdomen and pelvis with no evidence of bowel obstruction CT head no acute intracranial abnormality Chest x-ray no active disease  Feeling little better today. Would like to eat something. No abd pain  Cta no w/r Regular s1/s2  Soft benign +bs No edema   A/P: Seizure Acute Gastroenteritis seizure-due due to missing medication due to acute illness and also vomiting them was loaded Keppra in ER Continue IV Keppra Continue IV hydration Add probiotics Ck GI panel for gastroenteritis

## 2021-06-22 NOTE — H&P (Addendum)
History and Physical    Brittany Nguyen QBH:[id number] DOB: 06-Aug-1966 DOA: 06/21/2021  PCP: [name], MD   Patient coming from: home  I have personally briefly reviewed patient's old medical records in [place]  Chief Complaint: seizure  HPI: Brittany Nguyen is a 55 y.o. female with medical history significant for Systolic CHF secondary to nonischemic cardiomyopathy s/p AICD, COPD, carcinoid tumor, diabetes, surgical hypothyroidism seizure disorder on Keppra, who presents to the ED following a seizure at home.  Patient remained awake during the seizure.  Patient has been having vomiting and diarrhea for the past 2 to 3 days and has been unable to take her medication.  She has had no fever or chills, no cough, chest pain or shortness of breath and no abdominal pain.  ED course: Soft blood pressures in the ED with systolic BP 09-73 but heart rate in the 80s and otherwise normal vitals Blood work with leukocytosis of 12,000 and lactic acid 1.9.  Potassium 2.8.  Moderate leukocyte esterase on urinalysis.  Lipase and LFTs within normal limits.  Creatinine normal.  Troponin normal at 4  EKG, personally viewed and interpreted: Sinus rhythm at 95 with prolonged QT interval  Imaging: CT abdomen and pelvis with no evidence of bowel obstruction CT head no acute intracranial abnormality Chest x-ray no active disease  Patient loaded with Keppra, treated with Benadryl and Reglan for vomiting.  Potassium repleted.  Rocephin started for possible UTI.  Hospitalist consulted for admission.  Review of Systems: As per HPI otherwise all other systems on review of systems negative.    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    CHF (congestive heart failure) ([place])    COPD (chronic obstructive pulmonary disease) ([employer])    Diverticulitis 2010   DVT (deep venous thrombosis) ([place])     Endometriosis 1990   Heart disease 2013   Hx MRSA infection    Iron deficiency    Lump or mass in breast    Restless leg    Seizures ([place])    Stomach cancer ([place])     Past Surgical History:  Procedure Laterality Date   ABDOMINAL HYSTERECTOMY     age 67   BREAST BIOPSY Right 2014   benign   CARDIAC DEFIBRILLATOR PLACEMENT     CARDIAC DEFIBRILLATOR PLACEMENT     CESAREAN SECTION     CHOLECYSTECTOMY     COLECTOMY     COLONOSCOPY WITH PROPOFOL N/A 02/22/2018   Procedure: COLONOSCOPY WITH PROPOFOL;  Surgeon: Toledo, Benay Pike, MD;  Location: [facility] ENDOSCOPY;  Service: Gastroenterology;  Laterality: N/A;   ESOPHAGOGASTRODUODENOSCOPY (EGD) WITH PROPOFOL N/A 02/22/2018   Procedure: ESOPHAGOGASTRODUODENOSCOPY (EGD) WITH PROPOFOL;  Surgeon: Toledo, Benay Pike, MD;  Location: [facility] ENDOSCOPY;  Service: Gastroenterology;  Laterality: N/A;   NASAL SINUS SURGERY  2012   OOPHORECTOMY       reports that she has never smoked. She has never used smokeless tobacco. She reports current drug use. She reports that she does not drink alcohol.  Allergies  Allergen Reactions   Contrast Media [Iodinated Diagnostic Agents] Shortness Of Breath    Per CT report in 2014 pt had break through contrast reaction with hives and SOB following 13 hour prep. MSY    Lidocaine Hives   Metrizamide Shortness Of Breath   Penicillins Hives and Other (See Comments)    Other reaction(s): Other (See Comments)  Has patient had a PCN reaction causing immediate rash, facial/tongue/throat swelling, SOB or lightheadedness with hypotension: Yes Has patient had a PCN reaction causing severe rash involving mucus membranes or skin necrosis: No Has patient had a PCN reaction that required hospitalization No Has patient had a PCN reaction occurring within the last 10 years: No If all of the above answers are "NO", then may proceed with Cephalosporin use. Has patient had a PCN reaction causing immediate rash, facial/tongue/throat  swelling, SOB or lightheadedness with hypotension: Yes Has patient had a PCN reaction causing severe rash involving mucus membranes or skin necrosis: No Has patient had a PCN reaction that required hospitalization No Has patient had a PCN reaction occurring within the last 10 years: No If all of the above answers are "NO", then may proceed with Cephalosporin use.  ediate rash, facial/tongue/throat swelling, SOB or lightheadedness with hypotension, tachy   Isosorbide Nitrate     Other reaction(s): Headache   Latex Hives   Ondansetron Other (See Comments)    Severe headache   Zofran [Ondansetron Hcl] Other (See Comments)    Causes migraines   Povidone-Iodine Rash    Blistering rash   Pulmicort [Budesonide] Itching    Family History  Problem Relation Age of Onset   Cancer Mother 69       ovarian   CAD Mother    Cancer Father 56       brain   CAD Father    Cancer Daughter 71       skin   Cancer Maternal Aunt 87       breast   Leukemia Paternal Grandfather       Prior to Admission medications   Medication Sig Start Date End Date Taking? Authorizing Provider  albuterol (ACCUNEB) 1.25 MG/3ML nebulizer solution Take 3 mLs by nebulization every 6 (six) hours as needed for wheezing. 11/09/17   [provider]  albuterol (VENTOLIN HFA) 108 (90 Base) MCG/ACT inhaler Inhale 2 puffs into the lungs every 6 (six) hours as needed for shortness of breath. 08/28/16   [provider]  beclomethasone (QVAR) 80 MCG/ACT inhaler Inhale 1 puff into the lungs 2 (two) times daily. 06/12/17   [provider]  brompheniramine-pseudoephedrine-DM 30-2-10 MG/5ML syrup Take 10 mLs by mouth 4 (four) times daily as needed. 12/06/20   Cuthriell, Charline Bills, PA-C  busPIRone (BUSPAR) 15 MG tablet Take 15 mg by mouth 2 (two) times daily as needed.  05/08/18   [provider]  chlorhexidine (PERIDEX) 0.12 % solution Use as directed 10 mLs in the mouth or throat 2 (two) times daily.  Swish and spit 12/06/20   Cuthriell, Charline Bills, PA-C  DULoxetine (CYMBALTA) 30 MG capsule Take 30 mg by mouth daily. 10/27/19   [provider]  ENTRESTO 24-26 MG Take 0.5 tablets by mouth 2 (two) times daily. 01/08/20   [provider]  famotidine (PEPCID) 20 MG tablet Take 1 tablet (20 mg total) by mouth 2 (two) times daily. 03/15/19   Carrie Mew, MD  FARXIGA 5 MG TABS tablet Take 5 mg by mouth daily. 11/11/19   [provider]  fentaNYL (DURAGESIC) 50 MCG/HR Place 1 patch onto the skin every 3 (three) days. 10/08/18   Epifanio Lesches, MD  furosemide (LASIX) 40 MG tablet Take 80 mg by mouth 2 (two) times daily.  07/09/18   [provider]  hydrOXYzine (ATARAX/VISTARIL) 25 MG tablet Take 25 mg by mouth every 6 (six) hours as needed for itching. 04/13/19  [provider]  levothyroxine (SYNTHROID) 175 MCG tablet Take 175 mcg by mouth daily before breakfast.     [provider]  LORazepam (ATIVAN) 1 MG tablet Take 1 mg by mouth 4 (four) times daily as needed. 12/20/19   [provider]  losartan (COZAAR) 25 MG tablet Take 25 mg by mouth daily.     [provider]  meloxicam (MOBIC) 15 MG tablet Take 1 tablet (15 mg total) by mouth daily. 09/24/20 09/24/21  Lannie Fields, PA-C  metoCLOPramide (REGLAN) 10 MG tablet Take 1 tablet (10 mg total) by mouth every 8 (eight) hours as needed for up to 3 days for nausea. 02/11/20 02/14/20  Rudene Re, MD  metoprolol succinate (TOPROL-XL) 50 MG 24 hr tablet Take 50 mg by mouth 2 (two) times daily. 01/25/19   [provider]  mirtazapine (REMERON) 15 MG tablet Take 15 mg by mouth at bedtime. 04/26/19 05/26/24  [provider]  morphine (MSIR) 15 MG tablet Take 15 mg by mouth as needed for severe pain.     [provider]  Multiple Vitamins-Minerals (ONE-A-DAY WOMENS PO) Take 1 tablet by mouth daily.    [provider]  nitroGLYCERIN (NITROSTAT) 0.4 MG SL  tablet Place 1 tablet (0.4 mg total) under the tongue every 5 (five) minutes as needed for chest pain. 01/24/17   Henreitta Leber, MD  omeprazole (PRILOSEC) 40 MG capsule Take 40 mg by mouth 2 (two) times daily. 10/19/18   [provider]  oxyCODONE (OXY IR/ROXICODONE) 5 MG immediate release tablet Take 5-10 mg by mouth every 4 (four) hours as needed for pain. 04/11/19   [provider]  oxyCODONE-acetaminophen (PERCOCET) 5-325 MG tablet Take 1 tablet by mouth every 4 (four) hours as needed. 02/11/20   Rudene Re, MD  oxyCODONE-acetaminophen (PERCOCET/ROXICET) 5-325 MG tablet Take 1 tablet by mouth every 4 (four) hours as needed.  01/17/20   [provider]  Pancrelipase, Lip-Prot-Amyl, (CREON) 24000-76000 units CPEP Take 3-4 capsules by mouth 3 (three) times daily with meals. 04/01/19   [provider]  pantoprazole (PROTONIX) 40 MG tablet Take 40 mg by mouth 2 (two) times daily. 12/20/19   [provider]  potassium chloride (KLOR-CON) 10 MEQ tablet Take 2 tablets (20 mEq total) by mouth daily for 5 days. 09/20/20 09/25/20  Vanessa Aynor, MD  potassium chloride SA (KLOR-CON M20) 20 MEQ tablet Take 1 tablet (20 mEq total) by mouth 2 (two) times daily for 7 days. 07/09/19 01/23/20  Carrie Mew, MD  predniSONE (DELTASONE) 50 MG tablet Take 1 tablet (50 mg total) by mouth daily with breakfast. 12/06/20   Cuthriell, Charline Bills, PA-C  promethazine (PHENERGAN) 25 MG tablet Take 1 tablet (25 mg total) by mouth every 6 (six) hours as needed. 07/09/19   Carrie Mew, MD  rOPINIRole (REQUIP) 4 MG tablet Take 4 mg by mouth at bedtime. 01/30/19   [provider]  spironolactone (ALDACTONE) 50 MG tablet Take 50 mg by mouth daily. 11/26/18   [provider]  sucralfate (CARAFATE) 1 GM/10ML suspension Take 10 mLs (1 g total) by mouth 4 (four) times daily. 12/06/20 12/06/21  Cuthriell, Charline Bills, PA-C  tiZANidine (ZANAFLEX) 4 MG capsule Take 1 capsule (4  mg total) by mouth 3 (three) times daily as needed for up to 30 doses for muscle spasms. 03/07/21   Triplett, Johnette Abraham B, FNP  vitamin B-12 (CYANOCOBALAMIN) 1000 MCG tablet Take 1,000 mcg by mouth daily.    [provider]    Physical Exam: Vitals:   06/21/21 2338 06/21/21 2340 06/22/21 0100 06/22/21 0230  BP: 110/70  98/64 91/60  Pulse: 94  83 74  Resp: 20  18 16   Temp: 98.1 F (36.7 C)     TempSrc: Oral     SpO2: 100%  99% 93%  Weight:  81.6 kg    Height:  5\' 5"  (1.651 m)       Vitals:   06/21/21 2338 06/21/21 2340 06/22/21 0100 06/22/21 0230  BP: 110/70  98/64 91/60  Pulse: 94  83 74  Resp: 20  18 16   Temp: 98.1 F (36.7 C)     TempSrc: Oral     SpO2: 100%  99% 93%  Weight:  81.6 kg    Height:  5\' 5"  (1.651 m)        Constitutional: Drowsy from medication administered in the ED but arousable and oriented x 3 . Not in any apparent distress HEENT:      Head: Normocephalic and atraumatic.         Eyes: PERLA, EOMI, Conjunctivae are normal. Sclera is non-icteric.       Mouth/Throat: Mucous membranes are moist.       Neck: Supple with no signs of meningismus. Cardiovascular: Regular rate and rhythm. No murmurs, gallops, or rubs. 2+ symmetrical distal pulses are present . No JVD. No LE edema Respiratory: Respiratory effort normal .Lungs sounds clear bilaterally. No wheezes, crackles, or rhonchi.  Gastrointestinal: Soft, non tender, and non distended with positive bowel sounds.  Genitourinary: No CVA tenderness. Musculoskeletal: Nontender with normal range of motion in all extremities. No cyanosis, or erythema of extremities. Neurologic:  Face is symmetric. Moving all extremities. No gross focal neurologic deficits . Skin: Skin is warm, dry.  No rash or ulcers Psychiatric: Mood and affect are normal    Labs on Admission: I have personally reviewed following labs and imaging studies  CBC: Recent Labs  Lab 06/22/21 0003  WBC 12.5*  NEUTROABS 7.7  HGB 14.1  HCT  40.5  MCV 87.1  PLT 784   Basic Metabolic Panel: Recent Labs  Lab 06/22/21 0003  NA 138  K 2.8*  CL 98  CO2 30  GLUCOSE 123*  BUN 13  CREATININE 0.96  CALCIUM 8.7*   GFR: Estimated Creatinine Clearance: 71.5 mL/min (by C-G formula based on SCr of 0.96 mg/dL). Liver Function Tests: Recent Labs  Lab 06/22/21 0003  AST 25  ALT 23  ALKPHOS 108  BILITOT 0.3  PROT 7.5  ALBUMIN 4.1   Recent Labs  Lab 06/22/21 0003  LIPASE 33   No results for input(s): AMMONIA in the last 168 hours. Coagulation Profile: No results for input(s): INR, PROTIME in the last 168 hours. Cardiac Enzymes: No results for input(s): CKTOTAL, CKMB, CKMBINDEX, TROPONINI in the last 168 hours. BNP (last 3 results) No results for input(s): PROBNP in the last 8760 hours. HbA1C: No results for input(s): HGBA1C in the last 72 hours. CBG: No results for input(s): GLUCAP in the last 168 hours. Lipid Profile: No results for input(s): CHOL, HDL, LDLCALC, TRIG, CHOLHDL, LDLDIRECT in the last 72 hours. Thyroid Function Tests: No results for input(s): TSH, T4TOTAL, FREET4, T3FREE, THYROIDAB in the last 72 hours. Anemia Panel: No results for input(s): VITAMINB12, FOLATE, FERRITIN, TIBC, IRON, RETICCTPCT in the last 72 hours. Urine analysis:    Component Value Date/Time   COLORURINE STRAW (A) 06/22/2021 0003   APPEARANCEUR HAZY (A) 06/22/2021 0003  APPEARANCEUR Clear 09/02/2014 2312   LABSPEC 1.005 06/22/2021 0003   LABSPEC 1.013 09/02/2014 2312   PHURINE 7.0 06/22/2021 0003   GLUCOSEU NEGATIVE 06/22/2021 0003   GLUCOSEU Negative 09/02/2014 2312   HGBUR NEGATIVE 06/22/2021 0003   BILIRUBINUR NEGATIVE 06/22/2021 0003   BILIRUBINUR Negative 09/02/2014 2312   KETONESUR NEGATIVE 06/22/2021 0003   PROTEINUR NEGATIVE 06/22/2021 0003   NITRITE NEGATIVE 06/22/2021 0003   LEUKOCYTESUR MODERATE (A) 06/22/2021 0003   LEUKOCYTESUR Negative 09/02/2014 2312    Radiological Exams on Admission: CT Abdomen  Pelvis Wo Contrast  Result Date: 06/22/2021 CLINICAL DATA:  Seizure, nausea/vomiting EXAM: CT ABDOMEN AND PELVIS WITHOUT CONTRAST TECHNIQUE: Multidetector CT imaging of the abdomen and pelvis was performed following the standard protocol without IV contrast. COMPARISON:  02/26/2021 FINDINGS: Lower chest: Lung bases are clear. Hepatobiliary: Unenhanced liver is unremarkable. Status post cholecystectomy. No intrahepatic or extrahepatic ductal dilatation. Pancreas: Mild parenchymal atrophy. Spleen: Within normal limits. Adrenals/Urinary Tract: Adrenal glands are within normal limits. Kidneys are within normal limits. No renal calculi or hydronephrosis. Bladder is within normal limits. Stomach/Bowel: Stomach is within normal limits. No evidence of bowel obstruction. Appendix is not discretely visualized. Status post left hemicolectomy with suture line in the lower pelvis (series 2/image 62). No colonic wall thickening or inflammatory changes. Vascular/Lymphatic: No evidence of abdominal aortic aneurysm. No suspicious abdominopelvic lymphadenopathy. Reproductive: Status post hysterectomy.  No adnexal masses. Other: No abdominopelvic ascites. Musculoskeletal: Degenerative changes of the lower thoracic spine. IMPRESSION: No evidence of bowel obstruction. Appendix is not discretely visualized and may be surgically absent. Status post cholecystectomy, left hemicolectomy, and hysterectomy. Electronically Signed   By: Julian Hy M.D.   On: 06/22/2021 00:32   CT Head Wo Contrast  Result Date: 06/22/2021 CLINICAL DATA:  Seizure EXAM: CT HEAD WITHOUT CONTRAST TECHNIQUE: Contiguous axial images were obtained from the base of the skull through the vertex without intravenous contrast. COMPARISON:  01/23/2020 FINDINGS: Brain: No evidence of acute infarction, hemorrhage, hydrocephalus, extra-axial collection or mass lesion/mass effect. Vascular: No hyperdense vessel or unexpected calcification. Skull: Normal. Negative  for fracture or focal lesion. Sinuses/Orbits: The visualized paranasal sinuses are essentially clear. The mastoid air cells are unopacified. Postsurgical changes related to prior FESS. 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 Other: None. IMPRESSION: No evidence of acute intracranial abnormality. Electronically Signed   By: Julian Hy M.D.   On: 06/22/2021 00:33   DG Chest Port 1 View  Result Date: 06/22/2021 CLINICAL DATA:  Seizure EXAM: PORTABLE CHEST 1 VIEW COMPARISON:  12/06/2020 FINDINGS: Left chest cardiac device with leads in the right atrium and ventricle, unchanged. Unchanged cardiac and mediastinal contours. No focal pulmonary opacity. No pleural effusion or pneumothorax. No acute osseous abnormality. IMPRESSION: No active disease. Electronically Signed   By: Merilyn Baba M.D.   On: 06/22/2021 00:09     Assessment/Plan    Breakthrough seizure ([place]) - Patient with a seizure after 2 days of not taking medication secondary to acute gastrointestinal illness - Loaded with Keppra in the ED - Continue IV Keppra - Ativan as needed seizures - Seizure and aspiration precautions    Acute gastroenteritis Benign neuroendocrine tumor of stomach - Clear liquid diet advance as tolerated - IV antiemetics (not QT prolonging) - IV hydration and pain meds and supportive care  QT prolongation - Avoid QT prolonging drugs - Cardiac monitor    UTI (urinary tract infection) - Urinalysis consistent with UTI - Received the first dose of Rocephin in the ED - We  will hold off on further antibiotics pending culture result    Type 2 diabetes mellitus without complication (HCC) - Sliding scale insulin coverage    Chronic systolic heart failure (HCC)   Non-ischemic cardiomyopathy (HCC)   S/P implantation of automatic cardioverter/defibrillator -Appears euvolemic, not acutely exacerbated - To resume oral meds once med rec done and tolerating - Monitor for fluid overload in view of IV  hydration    COPD (chronic obstructive pulmonary disease) ([place]) - Not acutely exacerbated DuoNebs as needed       DVT prophylaxis: Lovenox  Code Status: full code  Family Communication:  none  Disposition Plan: Back to previous home environment Consults called: none  Status:observation    Athena Masse MD [facility]     06/22/2021, 3:50 AM

## 2021-06-22 NOTE — ED Notes (Signed)
Pt was given crackers and water. Pt tolerated well.

## 2021-06-22 NOTE — ED Notes (Signed)
RN secure chat MD to ask about pt's diet order per pt request. No new orders at this time.

## 2021-06-22 NOTE — ED Notes (Signed)
Pt return from CT.

## 2021-06-22 NOTE — ED Notes (Signed)
RN at bedside. Pt requesting to use the bathroom. Upon standing pt began to feel dizzy and very lightheaded. Pt assisted back in bed. Purewick placed on pt.

## 2021-06-22 NOTE — ED Notes (Signed)
Pt co dizziness, headache and nausea, EDP made aware.

## 2021-06-22 NOTE — ED Notes (Signed)
Report received from [facility]. Patient care assumed. Patient/RN introduction complete. Will continue to monitor.

## 2021-06-22 NOTE — ED Notes (Signed)
Pt family at bedside

## 2021-06-22 NOTE — ED Notes (Signed)
MD messaged again in regard to diet order

## 2021-06-22 NOTE — ED Notes (Signed)
Patient transported to CT 

## 2021-06-23 DIAGNOSIS — E1169 Type 2 diabetes mellitus with other specified complication: Secondary | ICD-10-CM

## 2021-06-23 LAB — BASIC METABOLIC PANEL
Anion gap: 8 (ref 5–15)
BUN: 13 mg/dL (ref 6–20)
CO2: 30 mmol/L (ref 22–32)
Calcium: 9.1 mg/dL (ref 8.9–10.3)
Chloride: 102 mmol/L (ref 98–111)
Creatinine, Ser: 0.98 mg/dL (ref 0.44–1.00)
GFR, Estimated: >60 mL/min (ref >60)
Glucose, Bld: 131 mg/dL — ABNORMAL HIGH (ref 70–99)
Potassium: 4 mmol/L (ref 3.5–5.1)
Sodium: 140 mmol/L (ref 135–145)

## 2021-06-23 LAB — HEMOGLOBIN A1C
Hgb A1c MFr Bld: 6.2 % — ABNORMAL HIGH (ref 4.8–5.6)
Mean Plasma Glucose: 131 mg/dL

## 2021-06-23 NOTE — Discharge Summary (Signed)
Physician Discharge Summary  Brittany Nguyen VZC:[id number] DOB: 09-Apr-1966 DOA: 06/21/2021  PCP: Brittany Pink, MD  Admit date: 06/21/2021 Discharge date: 06/23/2021  Admitted From: home  Disposition:  home   Recommendations for Outpatient Follow-up:  Follow up with PCP in 1-2 weeks F/u w/ neuro at [facility] clinic in 1 week  Home Health: no  Equipment/Devices:  Discharge Condition: stable  CODE STATUS: full  Diet recommendation: Heart Healthy   Brief/Interim Summary: HPI was taken from Dr. Damita Dunnings: Brittany Nguyen is a 55 y.o. female with medical history significant for Systolic CHF secondary to nonischemic cardiomyopathy s/p AICD, COPD, carcinoid tumor, diabetes, surgical hypothyroidism seizure disorder on Keppra, who presents to the ED following a seizure at home.  Patient remained awake during the seizure.  Patient has been having vomiting and diarrhea for the past 2 to 3 days and has been unable to take her medication.  She has had no fever or chills, no cough, chest pain or shortness of breath and no abdominal pain.   ED course: Soft blood pressures in the ED with systolic BP 12-87 but heart rate in the 80s and otherwise normal vitals Blood work with leukocytosis of 12,000 and lactic acid 1.9.  Potassium 2.8.  Moderate leukocyte esterase on urinalysis.  Lipase and LFTs within normal limits.  Creatinine normal.  Troponin normal at 4   EKG, personally viewed and interpreted: Sinus rhythm at 95 with prolonged QT interval   Imaging: CT abdomen and pelvis with no evidence of bowel obstruction CT head no acute intracranial abnormality Chest x-ray no active disease   Patient loaded with Keppra, treated with Benadryl and Reglan for vomiting.  Potassium repleted.  Rocephin started for possible UTI.  Hospitalist consulted for admission.  Hospital course from Dr. Jimmye Norman 06/23/21: Pt was able to tolerate a po diet w/o any nausea or vomiting or diarrhea. Pt was continued on home dose  of keppra. Pt was ambulating and transferring independently so PT/OT was not needed. For more information, please see previous progress notes   Discharge Diagnoses:  Active Problems:   Chronic systolic heart failure (HCC)   COPD (chronic obstructive pulmonary disease) (HCC)   Non-ischemic cardiomyopathy (HCC)   S/P implantation of automatic cardioverter/defibrillator (AICD)   Benign neuroendocrine tumor of stomach   Type 2 diabetes mellitus without complication (HCC)   Breakthrough seizure ([place])   Acute gastroenteritis   UTI (urinary tract infection)  Breakthrough seizure: had a seizure after 2 days of not taking medication secondary to acute gastrointestinal illness. Continue on keppra. Ativan prn. Seizure & aspiration precautions   Acute gastroenteritis: much improved. Continue on IVFs. Hx of benign neuroendocrine tumor of stomach. Compazine prn for nausea/vomiting   QT prolongation: avoid QT prolonging drugs  Unlikely UTI: urine cx shows no growth   DM2: likely poorly controlled. Continue on SSI w/ accuchecks    Chronic systolic heart failure: appears euvolemic. Monitor I/Os. Hx of AICD & non-ischemic cardiomyopathy.   COPD: w/o exacerbation. Continue on bronchodilators  Discharge Instructions  Discharge Instructions     Diet - low sodium heart healthy   Complete by: As directed    Discharge instructions   Complete by: As directed    F/u w/ PCP in 1-2 weeks. F/u w/ neuro in 1-2 weeks   Increase activity slowly   Complete by: As directed       Allergies as of 06/23/2021       Reactions   Contrast Media [iodinated Diagnostic Agents] Shortness Of Breath  Per CT report in 2014 pt had break through contrast reaction with hives and SOB following 13 hour prep. MSY    Lidocaine Hives   Metrizamide Shortness Of Breath   Penicillins Hives, Other (See Comments)   Other reaction(s): Other (See Comments) Has patient had a PCN reaction causing immediate rash,  facial/tongue/throat swelling, SOB or lightheadedness with hypotension: Yes Has patient had a PCN reaction causing severe rash involving mucus membranes or skin necrosis: No Has patient had a PCN reaction that required hospitalization No Has patient had a PCN reaction occurring within the last 10 years: No If all of the above answers are "NO", then may proceed with Cephalosporin use. Has patient had a PCN reaction causing immediate rash, facial/tongue/throat swelling, SOB or lightheadedness with hypotension: Yes Has patient had a PCN reaction causing severe rash involving mucus membranes or skin necrosis: No Has patient had a PCN reaction that required hospitalization No Has patient had a PCN reaction occurring within the last 10 years: No If all of the above answers are "NO", then may proceed with Cephalosporin use. ediate rash, facial/tongue/throat swelling, SOB or lightheadedness with hypotension, tachy   Sacubitril-valsartan    Other reaction(s): Other (See Comments) Sz (unclear if new start entresto vs hypoglycemia)   Isosorbide Nitrate    Other reaction(s): Headache   Latex Hives   Ondansetron Other (See Comments)   Severe headache   Zofran [ondansetron Hcl] Other (See Comments)   Causes migraines   Povidone-iodine Rash   Blistering rash   Pulmicort [budesonide] Itching        Medication List     STOP taking these medications    meloxicam 15 MG tablet Commonly known as: MOBIC   predniSONE 50 MG tablet Commonly known as: DELTASONE   vitamin B-12 1000 MCG tablet Commonly known as: CYANOCOBALAMIN       TAKE these medications    albuterol 108 (90 Base) MCG/ACT inhaler Commonly known as: VENTOLIN HFA Inhale 2 puffs into the lungs every 6 (six) hours as needed for shortness of breath.   albuterol 1.25 MG/3ML nebulizer solution Commonly known as: ACCUNEB Take 3 mLs by nebulization every 6 (six) hours as needed for wheezing.   brompheniramine-pseudoephedrine-DM  30-2-10 MG/5ML syrup Take 10 mLs by mouth 4 (four) times daily as needed.   chlorhexidine 0.12 % solution Commonly known as: PERIDEX Use as directed 10 mLs in the mouth or throat 2 (two) times daily. Swish and spit   famotidine 20 MG tablet Commonly known as: PEPCID Take 1 tablet (20 mg total) by mouth 2 (two) times daily.   fentaNYL 50 MCG/HR Commonly known as: [place] 1 patch onto the skin every 3 (three) days.   furosemide 40 MG tablet Commonly known as: LASIX Take 80 mg by mouth 2 (two) times daily.   gabapentin 100 MG capsule Commonly known as: NEURONTIN Take 100 mg by mouth at bedtime.   levETIRAcetam 500 MG tablet Commonly known as: KEPPRA Take 500 mg by mouth 2 (two) times daily.   levothyroxine 200 MCG tablet Commonly known as: SYNTHROID Take 200 mcg by mouth daily. What changed: Another medication with the same name was removed. Continue taking this medication, and follow the directions you see here.   LORazepam 1 MG tablet Commonly known as: ATIVAN Take 1 mg by mouth 4 (four) times daily as needed.   losartan 25 MG tablet Commonly known as: COZAAR Take 25 mg by mouth daily.   metoprolol succinate 50 MG 24 hr tablet  Commonly known as: TOPROL-XL Take 50 mg by mouth 2 (two) times daily.   morphine 15 MG tablet Commonly known as: MSIR Take 15 mg by mouth as needed for severe pain.   nitroGLYCERIN 0.4 MG SL tablet Commonly known as: NITROSTAT Place 1 tablet (0.4 mg total) under the tongue every 5 (five) minutes as needed for chest pain.   ONE-A-DAY WOMENS PO Take 1 tablet by mouth daily.   oxyCODONE 5 MG immediate release tablet Commonly known as: Oxy IR/ROXICODONE Take 5-10 mg by mouth every 4 (four) hours as needed for pain.   oxyCODONE-acetaminophen 5-325 MG tablet Commonly known as: PERCOCET/ROXICET Take 1 tablet by mouth every 4 (four) hours as needed.   oxyCODONE-acetaminophen 5-325 MG tablet Commonly known as: Percocet Take 1  tablet by mouth every 4 (four) hours as needed.   potassium chloride SA 20 MEQ tablet Commonly known as: [place]-Con M20 Take 1 tablet (20 mEq total) by mouth 2 (two) times daily for 7 days.   promethazine 25 MG tablet Commonly known as: PHENERGAN Take 1 tablet (25 mg total) by mouth every 6 (six) hours as needed.   rOPINIRole 4 MG tablet Commonly known as: REQUIP Take 4 mg by mouth at bedtime.   spironolactone 50 MG tablet Commonly known as: ALDACTONE Take 50 mg by mouth daily.   sucralfate 1 GM/10ML suspension Commonly known as: Carafate Take 10 mLs (1 g total) by mouth 4 (four) times daily.   tiZANidine 4 MG tablet Commonly known as: ZANAFLEX Take 4 mg by mouth 3 (three) times daily.   zonisamide 100 MG capsule Commonly known as: ZONEGRAN Take 100 mg by mouth 2 (two) times daily.        Allergies  Allergen Reactions   Contrast Media [Iodinated Diagnostic Agents] Shortness Of Breath    Per CT report in 2014 pt had break through contrast reaction with hives and SOB following 13 hour prep. MSY    Lidocaine Hives   Metrizamide Shortness Of Breath   Penicillins Hives and Other (See Comments)    Other reaction(s): Other (See Comments) Has patient had a PCN reaction causing immediate rash, facial/tongue/throat swelling, SOB or lightheadedness with hypotension: Yes Has patient had a PCN reaction causing severe rash involving mucus membranes or skin necrosis: No Has patient had a PCN reaction that required hospitalization No Has patient had a PCN reaction occurring within the last 10 years: No If all of the above answers are "NO", then may proceed with Cephalosporin use. Has patient had a PCN reaction causing immediate rash, facial/tongue/throat swelling, SOB or lightheadedness with hypotension: Yes Has patient had a PCN reaction causing severe rash involving mucus membranes or skin necrosis: No Has patient had a PCN reaction that required hospitalization No Has patient had a  PCN reaction occurring within the last 10 years: No If all of the above answers are "NO", then may proceed with Cephalosporin use.  ediate rash, facial/tongue/throat swelling, SOB or lightheadedness with hypotension, tachy   Sacubitril-Valsartan     Other reaction(s): Other (See Comments) Sz (unclear if new start entresto vs hypoglycemia)   Isosorbide Nitrate     Other reaction(s): Headache   Latex Hives   Ondansetron Other (See Comments)    Severe headache   Zofran [Ondansetron Hcl] Other (See Comments)    Causes migraines   Povidone-Iodine Rash    Blistering rash   Pulmicort [Budesonide] Itching    Consultations:    Procedures/Studies: CT Abdomen Pelvis Wo Contrast  Result Date: 06/22/2021  CLINICAL DATA:  Seizure, nausea/vomiting EXAM: CT ABDOMEN AND PELVIS WITHOUT CONTRAST TECHNIQUE: Multidetector CT imaging of the abdomen and pelvis was performed following the standard protocol without IV contrast. COMPARISON:  02/26/2021 FINDINGS: Lower chest: Lung bases are clear. Hepatobiliary: Unenhanced liver is unremarkable. Status post cholecystectomy. No intrahepatic or extrahepatic ductal dilatation. Pancreas: Mild parenchymal atrophy. Spleen: Within normal limits. Adrenals/Urinary Tract: Adrenal glands are within normal limits. Kidneys are within normal limits. No renal calculi or hydronephrosis. Bladder is within normal limits. Stomach/Bowel: Stomach is within normal limits. No evidence of bowel obstruction. Appendix is not discretely visualized. Status post left hemicolectomy with suture line in the lower pelvis (series 2/image 62). No colonic wall thickening or inflammatory changes. Vascular/Lymphatic: No evidence of abdominal aortic aneurysm. No suspicious abdominopelvic lymphadenopathy. Reproductive: Status post hysterectomy.  No adnexal masses. Other: No abdominopelvic ascites. Musculoskeletal: Degenerative changes of the lower thoracic spine. IMPRESSION: No evidence of bowel  obstruction. Appendix is not discretely visualized and may be surgically absent. Status post cholecystectomy, left hemicolectomy, and hysterectomy. Electronically Signed   By: Julian Hy M.D.   On: 06/22/2021 00:32   CT Head Wo Contrast  Result Date: 06/22/2021 CLINICAL DATA:  Seizure EXAM: CT HEAD WITHOUT CONTRAST TECHNIQUE: Contiguous axial images were obtained from the base of the skull through the vertex without intravenous contrast. COMPARISON:  01/23/2020 FINDINGS: Brain: No evidence of acute infarction, hemorrhage, hydrocephalus, extra-axial collection or mass lesion/mass effect. Vascular: No hyperdense vessel or unexpected calcification. Skull: Normal. Negative for fracture or focal lesion. Sinuses/Orbits: The visualized paranasal sinuses are essentially clear. The mastoid air cells are unopacified. Postsurgical changes related to prior FESS. 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 Other: None. IMPRESSION: No evidence of acute intracranial abnormality. Electronically Signed   By: Julian Hy M.D.   On: 06/22/2021 00:33   DG Chest Port 1 View  Result Date: 06/22/2021 CLINICAL DATA:  Seizure EXAM: PORTABLE CHEST 1 VIEW COMPARISON:  12/06/2020 FINDINGS: Left chest cardiac device with leads in the right atrium and ventricle, unchanged. Unchanged cardiac and mediastinal contours. No focal pulmonary opacity. No pleural effusion or pneumothorax. No acute osseous abnormality. IMPRESSION: No active disease. Electronically Signed   By: Merilyn Baba M.D.   On: 06/22/2021 00:09   (Echo, Carotid, EGD, Colonoscopy, ERCP)    Subjective: Pt denies any complaints    Discharge Exam: Vitals:   06/23/21 0803 06/23/21 1156  BP: 108/76 105/74  Pulse: 86 90  Resp: 12 14  Temp: 98.3 F (36.8 C)   SpO2: 95% 96%   Vitals:   06/23/21 0400 06/23/21 0500 06/23/21 0803 06/23/21 1156  BP: 110/74 115/75 108/76 105/74  Pulse: 78 76 86 90  Resp: 17 11 12 14   Temp:   98.3 F (36.8 C)    TempSrc:   Oral   SpO2: 94% 93% 95% 96%  Weight:      Height:        General: Pt is alert, awake, not in acute distress Cardiovascular: S1/S2 +, no rubs, no gallops Respiratory: CTA bilaterally, no wheezing, no rhonchi Abdominal: Soft, NT, obese, bowel sounds + Extremities: no cyanosis    The results of significant diagnostics from this hospitalization (including imaging, microbiology, ancillary and laboratory) are listed below for reference.     Microbiology: Recent Results (from the past 240 hour(s))  Urine Culture     Status: None   Collection Time: 06/22/21 12:03 AM   Specimen: Urine, Random  Result Value Ref Range Status   Specimen Description  Final    URINE, RANDOM Performed at [facility], [address], [place], [place] [address]    Special Requests   Final    NONE Performed at [facility], [address] [place], [place] [address]    Culture   Final    NO GROWTH Performed at [facility], [place] [address] [place], [place] [address]    Report Status 06/22/2021 FINAL  Final  Resp Panel by RT-PCR (Flu A&B, Covid) Nasopharyngeal Swab     Status: None   Collection Time: 06/22/21 12:03 AM   Specimen: Nasopharyngeal Swab; Nasopharyngeal(NP) swabs in vial transport medium  Result Value Ref Range Status   SARS Coronavirus 2 by RT PCR NEGATIVE NEGATIVE Final    Comment: (NOTE) SARS-CoV-2 target nucleic acids are NOT DETECTED.  The SARS-CoV-2 RNA is generally detectable in upper respiratory specimens during the acute phase of infection. The lowest concentration of SARS-CoV-2 viral copies this assay can detect is 138 copies/mL. A negative result does not preclude SARS-Cov-2 infection and should not be used as the sole basis for treatment or other patient management decisions. A negative result may occur with  improper specimen collection/handling, submission of specimen other than nasopharyngeal swab, presence of viral mutation(s)  within the areas targeted by this assay, and inadequate number of viral copies(<138 copies/mL). A negative result must be combined with clinical observations, patient history, and epidemiological information. The expected result is Negative.  Fact Sheet for Patients:  EntrepreneurPulse.com.au  Fact Sheet for Healthcare Providers:  IncredibleEmployment.be  This test is no t yet approved or cleared by the [place] FDA and  has been authorized for detection and/or diagnosis of SARS-CoV-2 by FDA under an Emergency Use Authorization (EUA). This EUA will remain  in effect (meaning this test can be used) for the duration of the COVID-19 declaration under Section 564(b)(1) of the Act, 21 U.S.C.section 360bbb-3(b)(1), unless the authorization is terminated  or revoked sooner.       Influenza A by PCR NEGATIVE NEGATIVE Final   Influenza B by PCR NEGATIVE NEGATIVE Final    Comment: (NOTE) The Xpert Xpress SARS-CoV-2/FLU/RSV plus assay is intended as an aid in the diagnosis of influenza from Nasopharyngeal swab specimens and should not be used as a sole basis for treatment. Nasal washings and aspirates are unacceptable for Xpert Xpress SARS-CoV-2/FLU/RSV testing.  Fact Sheet for Patients: EntrepreneurPulse.com.au  Fact Sheet for Healthcare Providers: IncredibleEmployment.be  This test is not yet approved or cleared by the [place] FDA and has been authorized for detection and/or diagnosis of SARS-CoV-2 by FDA under an Emergency Use Authorization (EUA). This EUA will remain in effect (meaning this test can be used) for the duration of the COVID-19 declaration under Section 564(b)(1) of the Act, 21 U.S.C. section 360bbb-3(b)(1), unless the authorization is terminated or revoked.  Performed at [facility], [address] [place], [place] [address]   Group A Strep by PCR     Status: None    Collection Time: 06/22/21 12:03 AM   Specimen: Throat; Sterile Swab  Result Value Ref Range Status   Group A Strep by PCR NOT DETECTED NOT DETECTED Final    Comment: Performed at [facility], [address] [place], [place] [address]  Culture, blood (Routine X 2) w Reflex to ID Panel     Status: None (Preliminary result)   Collection Time: 06/22/21  4:22 AM   Specimen: BLOOD  Result Value Ref Range Status   Specimen  Description BLOOD RIGHT HAND  Final   Special Requests   Final    BOTTLES DRAWN AEROBIC AND ANAEROBIC Blood Culture results may not be optimal due to an inadequate volume of blood received in culture bottles   Culture   Final    NO GROWTH 1 DAY Performed at [facility], [address], [place], [place] [address]    Report Status PENDING  Incomplete  Culture, blood (Routine X 2) w Reflex to ID Panel     Status: None (Preliminary result)   Collection Time: 06/22/21  4:22 AM   Specimen: BLOOD  Result Value Ref Range Status   Specimen Description BLOOD RIGHT [facility]  Final   Special Requests   Final    BOTTLES DRAWN AEROBIC AND ANAEROBIC Blood Culture adequate volume   Culture   Final    NO GROWTH 1 DAY Performed at [facility], [address] [place], [place] [address]    Report Status PENDING  Incomplete     Labs: BNP (last 3 results) Recent Labs    09/12/20 1629  BNP 56.4   Basic Metabolic Panel: Recent Labs  Lab 06/22/21 0003 06/22/21 0827 06/23/21 0639  NA 138  --  140  K 2.8* 4.2 4.0  CL 98  --  102  CO2 30  --  30  GLUCOSE 123*  --  131*  BUN 13  --  13  CREATININE 0.96  --  0.98  CALCIUM 8.7*  --  9.1  MG  --  2.2  --    Liver Function Tests: Recent Labs  Lab 06/22/21 0003  AST 25  ALT 23  ALKPHOS 108  BILITOT 0.3  PROT 7.5  ALBUMIN 4.1   Recent Labs  Lab 06/22/21 0003  LIPASE 33   No results for input(s): AMMONIA in the last 168 hours. CBC: Recent Labs  Lab 06/22/21 0003  WBC 12.5*  NEUTROABS  7.7  HGB 14.1  HCT 40.5  MCV 87.1  PLT 283   Cardiac Enzymes: No results for input(s): CKTOTAL, CKMB, CKMBINDEX, TROPONINI in the last 168 hours. BNP: Invalid input(s): POCBNP CBG: Recent Labs  Lab 06/22/21 0822 06/22/21 1456  GLUCAP 104* 136*   D-Dimer No results for input(s): DDIMER in the last 72 hours. Hgb A1c Recent Labs    06/22/21 0422  HGBA1C 6.2*   Lipid Profile No results for input(s): CHOL, HDL, LDLCALC, TRIG, CHOLHDL, LDLDIRECT in the last 72 hours. Thyroid function studies No results for input(s): TSH, T4TOTAL, T3FREE, THYROIDAB in the last 72 hours.  Invalid input(s): FREET3 Anemia work up No results for input(s): VITAMINB12, FOLATE, FERRITIN, TIBC, IRON, RETICCTPCT in the last 72 hours. Urinalysis    Component Value Date/Time   COLORURINE STRAW (A) 06/22/2021 0003   APPEARANCEUR HAZY (A) 06/22/2021 0003   APPEARANCEUR Clear 09/02/2014 2312   LABSPEC 1.005 06/22/2021 0003   LABSPEC 1.013 09/02/2014 2312   PHURINE 7.0 06/22/2021 0003   GLUCOSEU NEGATIVE 06/22/2021 0003   GLUCOSEU Negative 09/02/2014 2312   HGBUR NEGATIVE 06/22/2021 0003   BILIRUBINUR NEGATIVE 06/22/2021 0003   BILIRUBINUR Negative 09/02/2014 2312   KETONESUR NEGATIVE 06/22/2021 0003   PROTEINUR NEGATIVE 06/22/2021 0003   NITRITE NEGATIVE 06/22/2021 0003   LEUKOCYTESUR MODERATE (A) 06/22/2021 0003   LEUKOCYTESUR Negative 09/02/2014 2312   Sepsis Labs Invalid input(s): PROCALCITONIN,  WBC,  LACTICIDVEN Microbiology Recent Results (from the past 240 hour(s))  Urine Culture     Status: None   Collection Time: 06/22/21  12:03 AM   Specimen: Urine, Random  Result Value Ref Range Status   Specimen Description   Final    URINE, RANDOM Performed at [facility], [address] [place], [place] [address]    Special Requests   Final    NONE Performed at [facility], [address] [place], [place] [address]    Culture   Final    NO GROWTH Performed at  [facility], [place] [address], [place], [place] [address]    Report Status 06/22/2021 FINAL  Final  Resp Panel by RT-PCR (Flu A&B, Covid) Nasopharyngeal Swab     Status: None   Collection Time: 06/22/21 12:03 AM   Specimen: Nasopharyngeal Swab; Nasopharyngeal(NP) swabs in vial transport medium  Result Value Ref Range Status   SARS Coronavirus 2 by RT PCR NEGATIVE NEGATIVE Final    Comment: (NOTE) SARS-CoV-2 target nucleic acids are NOT DETECTED.  The SARS-CoV-2 RNA is generally detectable in upper respiratory specimens during the acute phase of infection. The lowest concentration of SARS-CoV-2 viral copies this assay can detect is 138 copies/mL. A negative result does not preclude SARS-Cov-2 infection and should not be used as the sole basis for treatment or other patient management decisions. A negative result may occur with  improper specimen collection/handling, submission of specimen other than nasopharyngeal swab, presence of viral mutation(s) within the areas targeted by this assay, and inadequate number of viral copies(<138 copies/mL). A negative result must be combined with clinical observations, patient history, and epidemiological information. The expected result is Negative.  Fact Sheet for Patients:  EntrepreneurPulse.com.au  Fact Sheet for Healthcare Providers:  IncredibleEmployment.be  This test is no t yet approved or cleared by the [place] FDA and  has been authorized for detection and/or diagnosis of SARS-CoV-2 by FDA under an Emergency Use Authorization (EUA). This EUA will remain  in effect (meaning this test can be used) for the duration of the COVID-19 declaration under Section 564(b)(1) of the Act, 21 U.S.C.section 360bbb-3(b)(1), unless the authorization is terminated  or revoked sooner.       Influenza A by PCR NEGATIVE NEGATIVE Final   Influenza B by PCR NEGATIVE NEGATIVE Final    Comment: (NOTE) The  Xpert Xpress SARS-CoV-2/FLU/RSV plus assay is intended as an aid in the diagnosis of influenza from Nasopharyngeal swab specimens and should not be used as a sole basis for treatment. Nasal washings and aspirates are unacceptable for Xpert Xpress SARS-CoV-2/FLU/RSV testing.  Fact Sheet for Patients: EntrepreneurPulse.com.au  Fact Sheet for Healthcare Providers: IncredibleEmployment.be  This test is not yet approved or cleared by the [place] FDA and has been authorized for detection and/or diagnosis of SARS-CoV-2 by FDA under an Emergency Use Authorization (EUA). This EUA will remain in effect (meaning this test can be used) for the duration of the COVID-19 declaration under Section 564(b)(1) of the Act, 21 U.S.C. section 360bbb-3(b)(1), unless the authorization is terminated or revoked.  Performed at [facility], [address] [place], [place] [address]   Group A Strep by PCR     Status: None   Collection Time: 06/22/21 12:03 AM   Specimen: Throat; Sterile Swab  Result Value Ref Range Status   Group A Strep by PCR NOT DETECTED NOT DETECTED Final    Comment: Performed at [facility], [address] [place], [place] [address]  Culture, blood (Routine X 2) w Reflex to ID Panel     Status: None (Preliminary result)  Collection Time: 06/22/21  4:22 AM   Specimen: BLOOD  Result Value Ref Range Status   Specimen Description BLOOD RIGHT HAND  Final   Special Requests   Final    BOTTLES DRAWN AEROBIC AND ANAEROBIC Blood Culture results may not be optimal due to an inadequate volume of blood received in culture bottles   Culture   Final    NO GROWTH 1 DAY Performed at [facility], [address] [place], [place] [address]    Report Status PENDING  Incomplete  Culture, blood (Routine X 2) w Reflex to ID Panel     Status: None (Preliminary result)   Collection Time: 06/22/21  4:22 AM   Specimen: BLOOD   Result Value Ref Range Status   Specimen Description BLOOD RIGHT [facility]  Final   Special Requests   Final    BOTTLES DRAWN AEROBIC AND ANAEROBIC Blood Culture adequate volume   Culture   Final    NO GROWTH 1 DAY Performed at [facility], [address], [place], [place] [address]    Report Status PENDING  Incomplete     Time coordinating discharge: Over 30 minutes  SIGNED:   Wyvonnia Dusky, MD  [facility] 06/23/2021, 2:22 PM Pager   If 7PM-7AM, please contact night-coverage

## 2021-06-26 ENCOUNTER — Emergency Department
Admission: EM | Admit: 2021-06-26 | Discharge: 2021-06-26 | Disposition: A | Discharge status: Home / Self Care | Payer: Medicare Other | Attending: Emergency Medicine | Admitting: Emergency Medicine

## 2021-06-26 ENCOUNTER — Other Ambulatory Visit: Payer: Self-pay

## 2021-06-26 ENCOUNTER — Encounter: Payer: Self-pay | Admitting: Oncology

## 2021-06-26 ENCOUNTER — Emergency Department: Payer: Medicare Other

## 2021-06-26 DIAGNOSIS — I5022 Chronic systolic (congestive) heart failure: Secondary | ICD-10-CM | POA: Diagnosis not present

## 2021-06-26 DIAGNOSIS — Z91040 Latex allergy status: Secondary | ICD-10-CM | POA: Insufficient documentation

## 2021-06-26 DIAGNOSIS — T40415A Adverse effect of fentanyl or fentanyl analogs, initial encounter: Secondary | ICD-10-CM | POA: Insufficient documentation

## 2021-06-26 DIAGNOSIS — E039 Hypothyroidism, unspecified: Secondary | ICD-10-CM | POA: Insufficient documentation

## 2021-06-26 DIAGNOSIS — Z85028 Personal history of other malignant neoplasm of stomach: Secondary | ICD-10-CM | POA: Insufficient documentation

## 2021-06-26 DIAGNOSIS — J45909 Unspecified asthma, uncomplicated: Secondary | ICD-10-CM | POA: Insufficient documentation

## 2021-06-26 DIAGNOSIS — T887XXA Unspecified adverse effect of drug or medicament, initial encounter: Secondary | ICD-10-CM | POA: Insufficient documentation

## 2021-06-26 DIAGNOSIS — E119 Type 2 diabetes mellitus without complications: Secondary | ICD-10-CM | POA: Insufficient documentation

## 2021-06-26 DIAGNOSIS — Z79899 Other long term (current) drug therapy: Secondary | ICD-10-CM | POA: Insufficient documentation

## 2021-06-26 DIAGNOSIS — T481X5A Adverse effect of skeletal muscle relaxants [neuromuscular blocking agents], initial encounter: Secondary | ICD-10-CM | POA: Diagnosis not present

## 2021-06-26 DIAGNOSIS — Z95810 Presence of automatic (implantable) cardiac defibrillator: Secondary | ICD-10-CM | POA: Diagnosis not present

## 2021-06-26 DIAGNOSIS — R4182 Altered mental status, unspecified: Secondary | ICD-10-CM | POA: Diagnosis present

## 2021-06-26 DIAGNOSIS — T426X5A Adverse effect of other antiepileptic and sedative-hypnotic drugs, initial encounter: Secondary | ICD-10-CM | POA: Insufficient documentation

## 2021-06-26 DIAGNOSIS — J449 Chronic obstructive pulmonary disease, unspecified: Secondary | ICD-10-CM | POA: Insufficient documentation

## 2021-06-26 LAB — COMPREHENSIVE METABOLIC PANEL
ALT: 33 U/L (ref 0–44)
AST: 33 U/L (ref 15–41)
Albumin: 4.1 g/dL (ref 3.5–5.0)
Alkaline Phosphatase: 89 U/L (ref 38–126)
Anion gap: 10 (ref 5–15)
BUN: 13 mg/dL (ref 6–20)
CO2: 28 mmol/L (ref 22–32)
Calcium: 8.5 mg/dL — ABNORMAL LOW (ref 8.9–10.3)
Chloride: 100 mmol/L (ref 98–111)
Creatinine, Ser: 1.08 mg/dL — ABNORMAL HIGH (ref 0.44–1.00)
GFR, Estimated: >60 mL/min (ref >60)
Glucose, Bld: 149 mg/dL — ABNORMAL HIGH (ref 70–99)
Potassium: 3.5 mmol/L (ref 3.5–5.1)
Sodium: 138 mmol/L (ref 135–145)
Total Bilirubin: 0.7 mg/dL (ref 0.3–1.2)
Total Protein: 7.6 g/dL (ref 6.5–8.1)

## 2021-06-26 LAB — CBC WITH DIFFERENTIAL/PLATELET
Abs Immature Granulocytes: 0.05 10*3/uL (ref 0.00–0.07)
Basophils Absolute: 0 10*3/uL (ref 0.0–0.1)
Basophils Relative: 0 %
Eosinophils Absolute: 0.2 10*3/uL (ref 0.0–0.5)
Eosinophils Relative: 1 %
HCT: 37.8 % (ref 36.0–46.0)
Hemoglobin: 13.3 g/dL (ref 12.0–15.0)
Immature Granulocytes: 0 %
Lymphocytes Relative: 22 %
Lymphs Abs: 2.5 10*3/uL (ref 0.7–4.0)
MCH: 30.6 pg (ref 26.0–34.0)
MCHC: 35.2 g/dL (ref 30.0–36.0)
MCV: 87.1 fL (ref 80.0–100.0)
Monocytes Absolute: 0.6 10*3/uL (ref 0.1–1.0)
Monocytes Relative: 6 %
Neutro Abs: 8 10*3/uL — ABNORMAL HIGH (ref 1.7–7.7)
Neutrophils Relative %: 71 %
Platelets: 236 10*3/uL (ref 150–400)
RBC: 4.34 MIL/uL (ref 3.87–5.11)
RDW: 12.9 % (ref 11.5–15.5)
WBC: 11.3 10*3/uL — ABNORMAL HIGH (ref 4.0–10.5)
nRBC: 0 % (ref 0.0–0.2)

## 2021-06-26 LAB — URINALYSIS, COMPLETE (UACMP) WITH MICROSCOPIC
Bilirubin Urine: NEGATIVE
Glucose, UA: NEGATIVE mg/dL
Hgb urine dipstick: NEGATIVE
Ketones, ur: NEGATIVE mg/dL
Nitrite: NEGATIVE
Protein, ur: NEGATIVE mg/dL
Specific Gravity, Urine: 1.012 (ref 1.005–1.030)
pH: 5 (ref 5.0–8.0)

## 2021-06-26 NOTE — ED Notes (Signed)
MD at the bedside  

## 2021-06-26 NOTE — ED Notes (Signed)
Pt to CT

## 2021-06-26 NOTE — ED Notes (Signed)
MD back at the bedside

## 2021-06-26 NOTE — Discharge Instructions (Signed)
Be careful with using muscle relaxers and sleep medications on top of your chronic pain medications.  These can interact and cause the confusion that occurred this evening.

## 2021-06-26 NOTE — ED Provider Notes (Signed)
[facility] Emergency Department Provider Note ____________________________________________   Event Date/Time   First MD Initiated Contact with Patient 06/26/21 0119     (approximate)  I have reviewed the triage vital signs and the nursing notes.  HISTORY  Chief Complaint Altered Mental Status   HPI Brittany Nguyen is a 55 y.o. femalewho presents to the ED for evaluation of confusion.   Chart review indicates history of seizure disorder on Keppra, CHF/COPD, atrial fibrillation and chronic pain disorder on quite a few opiates.  Systolic CHF from nonischemic cardiomyopathy with AICD in place.  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 Recent medical admission in our facility, discharged home 3 days ago.  Due to seizure in the setting of poorly controlled nausea/vomiting.  Patient presents to the ED for evaluation of confusion.  She is alert, oriented and reports chronic pain throughout much of her body, including her shoulders, legs and abdomen.  Denies any falls or trauma.  Reports that she has not taken any medications today for pain, but does report that she has taken her fentanyl patch and trazodone and Flexeril.  Reports that she does not know why that she is here.  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[facility])     Patient Active Problem List   Diagnosis Date Noted   Breakthrough seizure ([place]) 06/22/2021   Acute gastroenteritis 06/22/2021   UTI (urinary tract infection) 06/22/2021   Non-ischemic cardiomyopathy ([place])  01/23/2020   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/23/2020   Seizure ([place]) 01/23/2020   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 difficile colitis 01/14/2018   GIB (gastrointestinal bleeding) 01/08/2018   SOB (shortness of breath) 11/01/2017   Influenza A 10/29/2017   Acute respiratory failure ([place]) 09/24/2017   Sepsis ([place]) 07/03/2017   COPD (chronic obstructive pulmonary disease) ([place]) 04/26/2017   Community acquired pneumonia 47/82/9562   Chronic systolic heart failure ([place]) 02/03/2017   Hypotension 02/03/2017   Chest pain 12/09/2016   RLS (restless legs syndrome) 12/09/2016   Cough, persistent 11/22/2015   Hypokalemia 11/22/2015   Leukocytosis 11/22/2015   Dyspnea 11/21/2015   Respiratory distress 09/19/2015   Breast pain 01/11/2013   Hx MRSA infection    Lump or mass in breast    GOITER, MULTINODULAR 01/20/2009   HYPOGLYCEMIA, UNSPECIFIED 01/20/2009   GERD 01/20/2009   DIVERTICULITIS OF COLON 01/20/2009    Past Surgical History:  Procedure Laterality Date   ABDOMINAL HYSTERECTOMY     age 21   BREAST BIOPSY Right 2014   benign   CARDIAC DEFIBRILLATOR [place]  CESAREAN SECTION     CHOLECYSTECTOMY     COLECTOMY     COLONOSCOPY WITH PROPOFOL N/A 02/22/2018   Procedure: COLONOSCOPY WITH PROPOFOL;  Surgeon: Toledo, Benay Pike, MD;  Location: [facility] ENDOSCOPY;  Service: Gastroenterology;  Laterality: N/A;   ESOPHAGOGASTRODUODENOSCOPY (EGD) WITH PROPOFOL N/A 02/22/2018   Procedure: ESOPHAGOGASTRODUODENOSCOPY (EGD) WITH PROPOFOL;  Surgeon: Toledo, Benay Pike, MD;   Location: [facility] ENDOSCOPY;  Service: Gastroenterology;  Laterality: N/A;   NASAL SINUS SURGERY  2012   OOPHORECTOMY      Prior to Admission medications   Medication Sig Start Date End Date Taking? Authorizing Provider  albuterol (ACCUNEB) 1.25 MG/3ML nebulizer solution Take 3 mLs by nebulization every 6 (six) hours as needed for wheezing. 11/09/17   [provider]  albuterol (VENTOLIN HFA) 108 (90 Base) MCG/ACT inhaler Inhale 2 puffs into the lungs every 6 (six) hours as needed for shortness of breath. 08/28/16   [provider]  brompheniramine-pseudoephedrine-DM 30-2-10 MG/5ML syrup Take 10 mLs by mouth 4 (four) times daily as needed. Patient not taking: No sig reported 12/06/20   Cuthriell, Charline Bills, PA-C  chlorhexidine (PERIDEX) 0.12 % solution Use as directed 10 mLs in the mouth or throat 2 (two) times daily. Swish and spit Patient not taking: No sig reported 12/06/20   Cuthriell, Charline Bills, PA-C  famotidine (PEPCID) 20 MG tablet Take 1 tablet (20 mg total) by mouth 2 (two) times daily. 03/15/19   Carrie Mew, MD  fentaNYL (DURAGESIC) 50 MCG/HR Place 1 patch onto the skin every 3 (three) days. 10/08/18   Epifanio Lesches, MD  furosemide (LASIX) 40 MG tablet Take 80 mg by mouth 2 (two) times daily.  07/09/18   [provider]  gabapentin (NEURONTIN) 100 MG capsule Take 100 mg by mouth at bedtime. 06/01/21   [provider]  levETIRAcetam (KEPPRA) 500 MG tablet Take 500 mg by mouth 2 (two) times daily. 02/10/21   [provider]  levothyroxine (SYNTHROID) 200 MCG tablet Take 200 mcg by mouth daily. 04/15/21   [provider]  LORazepam (ATIVAN) 1 MG tablet Take 1 mg by mouth 4 (four) times daily as needed. 12/20/19   [provider]  losartan (COZAAR) 25 MG tablet Take 25 mg by mouth daily.     [provider]  metoprolol succinate (TOPROL-XL) 50 MG 24 hr tablet Take 50 mg by mouth 2 (two) times daily. 01/25/19   [provider]  morphine (MSIR) 15 MG tablet Take 15 mg by mouth as needed for severe pain.     [provider]  Multiple Vitamins-Minerals (ONE-A-DAY WOMENS PO) Take 1 tablet by mouth daily. Patient not taking: Reported on 06/22/2021    [provider]  nitroGLYCERIN (NITROSTAT) 0.4 MG SL tablet Place 1 tablet (0.4 mg total) under the tongue every 5 (five) minutes as needed for chest pain. 01/24/17   Henreitta Leber, MD  oxyCODONE (OXY IR/ROXICODONE) 5 MG immediate release tablet Take 5-10 mg by mouth every 4 (four) hours as needed for pain. Patient not taking: Reported on 06/22/2021 04/11/19   [provider]  oxyCODONE-acetaminophen (PERCOCET) 5-325 MG tablet Take 1 tablet by mouth every 4 (four) hours as needed. 02/11/20   Rudene Re, MD  oxyCODONE-acetaminophen (PERCOCET/ROXICET) 5-325 MG tablet Take 1 tablet by mouth every 4 (four) hours as needed.  Patient not taking: Reported on 06/22/2021 01/17/20   [provider]  potassium chloride SA (KLOR-CON M20) 20 MEQ tablet Take 1 tablet (20 mEq total)  by mouth 2 (two) times daily for 7 days. 07/09/19 01/23/20  Carrie Mew, MD  promethazine (PHENERGAN) 25 MG tablet Take 1 tablet (25 mg total) by mouth every 6 (six) hours as needed. 07/09/19   Carrie Mew, MD  rOPINIRole (REQUIP) 4 MG tablet Take 4 mg by mouth at bedtime. 01/30/19   [provider]  spironolactone (ALDACTONE) 50 MG tablet Take 50 mg by mouth daily. 11/26/18   [provider]  sucralfate (CARAFATE) 1 GM/10ML suspension Take 10 mLs (1 g total) by mouth 4 (four) times daily. Patient not taking: No sig reported 12/06/20 12/06/21  Cuthriell, Charline Bills, PA-C  tiZANidine (ZANAFLEX) 4 MG tablet Take 4 mg by mouth 3 (three) times daily. 06/17/21   [provider]  zonisamide (ZONEGRAN) 100 MG capsule Take 100 mg by mouth 2 (two) times daily. 05/18/21   [provider]    Allergies Contrast media [iodinated  diagnostic agents], Lidocaine, Metrizamide, Penicillins, Sacubitril-valsartan, Isosorbide nitrate, Latex, Ondansetron, Zofran [ondansetron hcl], Povidone-iodine, and Pulmicort [budesonide]  Family History  Problem Relation Age of Onset   Cancer Mother 19       ovarian   CAD Mother    Cancer Father 43       brain   CAD Father    Cancer Daughter 70       skin   Cancer Maternal Aunt 54       breast   Leukemia Paternal Grandfather     Social History Social History   Tobacco Use   Smoking status: Never   Smokeless tobacco: Never  Vaping Use   Vaping Use: Never used  Substance Use Topics   Alcohol use: No   Drug use: Yes    Comment: prescribed fentanyl patch, morphine and phenergan   Review of Systems  Constitutional: No fever/chills Eyes: No visual changes. ENT: No sore throat. Cardiovascular: Denies chest pain. Respiratory: Denies shortness of breath. Gastrointestinal: No abdominal pain.  No nausea, no vomiting.  No diarrhea.  No constipation. Genitourinary: Negative for dysuria. Musculoskeletal: Positive for chronic pain. Skin: Negative for rash. Neurological: Negative for headaches, focal weakness or numbness. ____________________________________________  PHYSICAL EXAM:  VITAL SIGNS: Vitals:   06/26/21 0230 06/26/21 0300  BP: (S) (!) 89/60 112/62  Pulse: (!) 59 61  Resp: 20 12  Temp:    SpO2: 95% 100%    Constitutional: Alert and oriented to self, year and location. Well appearing and in no acute distress.  Obese. Eyes: Conjunctivae are normal. PERRL. EOMI. Head: Atraumatic. Nose: No congestion/rhinnorhea. Mouth/Throat: Mucous membranes are moist.  Oropharynx non-erythematous. Neck: No stridor. No cervical spine tenderness to palpation. Cardiovascular: Normal rate, regular rhythm. Grossly normal heart sounds.  Good peripheral circulation. Respiratory: Normal respiratory effort.  No retractions. Lungs CTAB. Gastrointestinal: Soft , nondistended. No CVA  tenderness. Musculoskeletal: No lower extremity tenderness nor edema.  No joint effusions. No signs of acute trauma. Neurologic:  Normal speech and language. No gross focal neurologic deficits are appreciated.  Skin:  Skin is warm, dry and intact. No rash noted. Psychiatric: Mood and affect are normal. Speech and behavior are normal. ____________________________________________   LABS (all labs ordered are listed, but only abnormal results are displayed)  Labs Reviewed  COMPREHENSIVE METABOLIC PANEL - Abnormal; Notable for the following components:      Result Value   Glucose, Bld 149 (*)    Creatinine, Ser 1.08 (*)    Calcium 8.5 (*)    All other components within normal limits  CBC  WITH DIFFERENTIAL/PLATELET - Abnormal; Notable for the following components:   WBC 11.3 (*)    Neutro Abs 8.0 (*)    All other components within normal limits  URINALYSIS, COMPLETE (UACMP) WITH MICROSCOPIC - Abnormal; Notable for the following components:   Color, Urine YELLOW (*)    APPearance HAZY (*)    Leukocytes,Ua TRACE (*)    Bacteria, UA RARE (*)    All other components within normal limits   ____________________________________________  12 Lead EKG  Sinus rhythm, rate of 63 bpm.  Slightly prolonged PR interval at 212 ms, otherwise normal intervals.  No STEMI. ____________________________________________  RADIOLOGY  ED MD interpretation:  ct head reviewed by me without evidence of acute intracranial pathology  Official radiology report(s): CT HEAD WO CONTRAST (5MM)  Result Date: 06/26/2021 CLINICAL DATA:  Altered mental status. EXAM: CT HEAD WITHOUT CONTRAST TECHNIQUE: Contiguous axial images were obtained from the base of the skull through the vertex without intravenous contrast. COMPARISON:  None. FINDINGS: Brain: The ventricles and sulci appropriate size for patient's age. The gray-white matter discrimination is preserved. There is no acute intracranial hemorrhage. No mass effect or  midline shift no extra-axial fluid collection. Vascular: No hyperdense vessel or unexpected calcification. Skull: Normal. Negative for fracture or focal lesion. Sinuses/Orbits: Mild diffuse mucoperiosteal thickening of paranasal sinuses. Bilateral maxillary sinus surgeries. The mastoid air cells are clear. Other: None IMPRESSION: Unremarkable noncontrast CT of the brain. Electronically Signed   By: Anner Crete M.D.   On: 06/26/2021 02:50    ____________________________________________   PROCEDURES and INTERVENTIONS  Procedure(s) performed (including Critical Care):  .1-3 Lead EKG Interpretation Performed by: Vladimir Crofts, MD Authorized by: Vladimir Crofts, MD     Interpretation: normal     ECG rate:  70   ECG rate assessment: normal     Rhythm: sinus rhythm     Ectopy: none     Conduction: normal    Medications - No data to display  ____________________________________________   MDM / ED COURSE   55 year old woman presents to the ED for evaluation of confusion that does not seem to be present here in the ED, with a benign work-up, and amenable to outpatient management.  She is alert, oriented without evidence of neurologic deficits.  No signs of trauma or injury.  Blood work is benign and UA without infectious features.  CT head without evidence of ICH or CVA.  Observed in ED for few hours without complication or issue.  I suspect polypharmacy in the setting of her copious medications.  We discussed return precautions prior to discharge.  Clinical Course as of 06/26/21 0435  Sat Jun 26, 2021  0131 Reassessed after EMS has departed and patient has been triaged.  She is alert, oriented and requesting pain medications. [DS]  2774 Sitting upright in bed. Normal vitals. Awaiting UA [DS]  1287 Reassessed.  Reassuring work-up.  Patient sitting up in bed and watching television.  Continues to be alert and oriented.  No issues.  We discussed likely polypharmacy [DS]    Clinical Course  User Index [DS] Vladimir Crofts, MD    ____________________________________________   FINAL CLINICAL IMPRESSION(S) / ED DIAGNOSES  Final diagnoses:  Altered mental status, unspecified altered mental status type  Polypharmacy     ED Discharge Orders     None        Anaka Beazer Tamala Julian   Note:  This document was prepared using Dragon voice recognition software and may include unintentional dictation errors.  Vladimir Crofts, MD 06/26/21 [phone]

## 2021-06-26 NOTE — ED Triage Notes (Signed)
Pt arrived via EMS from home after her spouse called 911 for pt being "confused". Pt was just discharged from this hospital yesterday after being treated for a UTI. Pt is presently alert and oriented to person and place. Pt has a Fentanyl patch on that was supposedly placed on the 20th. Also, EMS mentioned something regarding pt taking some Flexeril. Unknown as to when and how many.

## 2021-06-27 LAB — CULTURE, BLOOD (ROUTINE X 2)
Culture: NO GROWTH
Culture: NO GROWTH
Special Requests: ADEQUATE

## 2021-08-10 DIAGNOSIS — G893 Neoplasm related pain (acute) (chronic): Secondary | ICD-10-CM | POA: Insufficient documentation

## 2021-08-10 DIAGNOSIS — U099 Post covid-19 condition, unspecified: Secondary | ICD-10-CM | POA: Insufficient documentation

## 2021-09-27 ENCOUNTER — Other Ambulatory Visit: Payer: Self-pay

## 2021-09-27 ENCOUNTER — Observation Stay
Admission: EM | Admit: 2021-09-27 | Discharge: 2021-09-28 | Disposition: A | Discharge status: Home / Self Care | Payer: Medicare HMO | Attending: Internal Medicine | Admitting: Internal Medicine

## 2021-09-27 ENCOUNTER — Emergency Department: Payer: Medicare HMO

## 2021-09-27 ENCOUNTER — Encounter: Payer: Self-pay | Admitting: Oncology

## 2021-09-27 DIAGNOSIS — Z95810 Presence of automatic (implantable) cardiac defibrillator: Secondary | ICD-10-CM | POA: Diagnosis not present

## 2021-09-27 DIAGNOSIS — E119 Type 2 diabetes mellitus without complications: Secondary | ICD-10-CM | POA: Diagnosis not present

## 2021-09-27 DIAGNOSIS — R112 Nausea with vomiting, unspecified: Secondary | ICD-10-CM

## 2021-09-27 DIAGNOSIS — Z79899 Other long term (current) drug therapy: Secondary | ICD-10-CM | POA: Insufficient documentation

## 2021-09-27 DIAGNOSIS — Z86718 Personal history of other venous thrombosis and embolism: Secondary | ICD-10-CM | POA: Insufficient documentation

## 2021-09-27 DIAGNOSIS — J449 Chronic obstructive pulmonary disease, unspecified: Secondary | ICD-10-CM | POA: Diagnosis not present

## 2021-09-27 DIAGNOSIS — R059 Cough, unspecified: Secondary | ICD-10-CM | POA: Diagnosis present

## 2021-09-27 DIAGNOSIS — Z85028 Personal history of other malignant neoplasm of stomach: Secondary | ICD-10-CM | POA: Diagnosis not present

## 2021-09-27 DIAGNOSIS — I5042 Chronic combined systolic (congestive) and diastolic (congestive) heart failure: Secondary | ICD-10-CM | POA: Diagnosis present

## 2021-09-27 DIAGNOSIS — A419 Sepsis, unspecified organism: Principal | ICD-10-CM | POA: Insufficient documentation

## 2021-09-27 DIAGNOSIS — Z91040 Latex allergy status: Secondary | ICD-10-CM | POA: Diagnosis not present

## 2021-09-27 DIAGNOSIS — J122 Parainfluenza virus pneumonia: Secondary | ICD-10-CM | POA: Diagnosis not present

## 2021-09-27 DIAGNOSIS — Z20822 Contact with and (suspected) exposure to covid-19: Secondary | ICD-10-CM | POA: Insufficient documentation

## 2021-09-27 DIAGNOSIS — J189 Pneumonia, unspecified organism: Secondary | ICD-10-CM | POA: Diagnosis not present

## 2021-09-27 DIAGNOSIS — Z79891 Long term (current) use of opiate analgesic: Secondary | ICD-10-CM

## 2021-09-27 DIAGNOSIS — B348 Other viral infections of unspecified site: Secondary | ICD-10-CM

## 2021-09-27 DIAGNOSIS — E890 Postprocedural hypothyroidism: Secondary | ICD-10-CM | POA: Diagnosis not present

## 2021-09-27 DIAGNOSIS — I5020 Unspecified systolic (congestive) heart failure: Secondary | ICD-10-CM | POA: Insufficient documentation

## 2021-09-27 DIAGNOSIS — J45909 Unspecified asthma, uncomplicated: Secondary | ICD-10-CM | POA: Insufficient documentation

## 2021-09-27 DIAGNOSIS — R6510 Systemic inflammatory response syndrome (SIRS) of non-infectious origin without acute organ dysfunction: Secondary | ICD-10-CM | POA: Diagnosis not present

## 2021-09-27 DIAGNOSIS — I5022 Chronic systolic (congestive) heart failure: Secondary | ICD-10-CM | POA: Diagnosis present

## 2021-09-27 DIAGNOSIS — I4891 Unspecified atrial fibrillation: Secondary | ICD-10-CM | POA: Insufficient documentation

## 2021-09-27 DIAGNOSIS — R9431 Abnormal electrocardiogram [ECG] [EKG]: Secondary | ICD-10-CM

## 2021-09-27 DIAGNOSIS — E039 Hypothyroidism, unspecified: Secondary | ICD-10-CM | POA: Diagnosis present

## 2021-09-27 DIAGNOSIS — D3A8 Other benign neuroendocrine tumors: Secondary | ICD-10-CM

## 2021-09-27 DIAGNOSIS — G40909 Epilepsy, unspecified, not intractable, without status epilepticus: Secondary | ICD-10-CM

## 2021-09-27 DIAGNOSIS — K529 Noninfective gastroenteritis and colitis, unspecified: Secondary | ICD-10-CM

## 2021-09-27 LAB — CBC WITH DIFFERENTIAL/PLATELET
Abs Immature Granulocytes: 0.04 10*3/uL (ref 0.00–0.07)
Basophils Absolute: 0.1 10*3/uL (ref 0.0–0.1)
Basophils Relative: 1 %
Eosinophils Absolute: 0.3 10*3/uL (ref 0.0–0.5)
Eosinophils Relative: 3 %
HCT: 42.4 % (ref 36.0–46.0)
Hemoglobin: 14.3 g/dL (ref 12.0–15.0)
Immature Granulocytes: 0 %
Lymphocytes Relative: 16 %
Lymphs Abs: 1.5 10*3/uL (ref 0.7–4.0)
MCH: 29.5 pg (ref 26.0–34.0)
MCHC: 33.7 g/dL (ref 30.0–36.0)
MCV: 87.4 fL (ref 80.0–100.0)
Monocytes Absolute: 0.9 10*3/uL (ref 0.1–1.0)
Monocytes Relative: 9 %
Neutro Abs: 6.6 10*3/uL (ref 1.7–7.7)
Neutrophils Relative %: 71 %
Platelets: 247 10*3/uL (ref 150–400)
RBC: 4.85 MIL/uL (ref 3.87–5.11)
RDW: 12.9 % (ref 11.5–15.5)
WBC: 9.3 10*3/uL (ref 4.0–10.5)
nRBC: 0 % (ref 0.0–0.2)

## 2021-09-27 LAB — COMPREHENSIVE METABOLIC PANEL
ALT: 28 U/L (ref 0–44)
AST: 34 U/L (ref 15–41)
Albumin: 4.4 g/dL (ref 3.5–5.0)
Alkaline Phosphatase: 96 U/L (ref 38–126)
Anion gap: 11 (ref 5–15)
BUN: 12 mg/dL (ref 6–20)
CO2: 29 mmol/L (ref 22–32)
Calcium: 8.4 mg/dL — ABNORMAL LOW (ref 8.9–10.3)
Chloride: 96 mmol/L — ABNORMAL LOW (ref 98–111)
Creatinine, Ser: 0.86 mg/dL (ref 0.44–1.00)
GFR, Estimated: >60 mL/min (ref >60)
Glucose, Bld: 118 mg/dL — ABNORMAL HIGH (ref 70–99)
Potassium: 3.4 mmol/L — ABNORMAL LOW (ref 3.5–5.1)
Sodium: 136 mmol/L (ref 135–145)
Total Bilirubin: 0.6 mg/dL (ref 0.3–1.2)
Total Protein: 8.1 g/dL (ref 6.5–8.1)

## 2021-09-27 LAB — RESP PANEL BY RT-PCR (FLU A&B, COVID) ARPGX2
Influenza A by PCR: NEGATIVE
Influenza B by PCR: NEGATIVE
SARS Coronavirus 2 by RT PCR: NEGATIVE

## 2021-09-27 LAB — APTT: aPTT: 30 seconds (ref 24–36)

## 2021-09-27 LAB — PROTIME-INR
INR: 1 (ref 0.8–1.2)
Prothrombin Time: 13.6 seconds (ref 11.4–15.2)

## 2021-09-27 LAB — LACTIC ACID, PLASMA: Lactic Acid, Venous: 1.5 mmol/L (ref 0.5–1.9)

## 2021-09-27 LAB — PROCALCITONIN: Procalcitonin: <0.1 ng/mL

## 2021-09-27 LAB — MAGNESIUM: Magnesium: 2 mg/dL (ref 1.7–2.4)

## 2021-09-27 MED ORDER — MORPHINE SULFATE (PF) 4 MG/ML IV SOLN
6.0000 mg | Freq: Once | INTRAVENOUS | Status: AC
  Administered 2021-09-27: 6 mg via INTRAVENOUS
  Filled 2021-09-27: qty 2

## 2021-09-27 MED ORDER — SODIUM CHLORIDE 0.9 % IV SOLN
100.0000 mg | Freq: Once | INTRAVENOUS | Status: AC
  Administered 2021-09-27: 100 mg via INTRAVENOUS
  Filled 2021-09-27: qty 100

## 2021-09-27 MED ORDER — SODIUM CHLORIDE 0.9 % IV SOLN
2.0000 g | INTRAVENOUS | Status: DC
  Administered 2021-09-27: 2 g via INTRAVENOUS
  Filled 2021-09-27 (×2): qty 20

## 2021-09-27 MED ORDER — LACTATED RINGERS IV BOLUS
500.0000 mL | Freq: Once | INTRAVENOUS | Status: AC
  Administered 2021-09-27: 500 mL via INTRAVENOUS

## 2021-09-27 MED ORDER — LACTATED RINGERS IV SOLN: INTRAVENOUS | Status: DC

## 2021-09-27 MED ORDER — METOCLOPRAMIDE HCL 5 MG/ML IJ SOLN
10.0000 mg | Freq: Once | INTRAMUSCULAR | Status: AC
  Administered 2021-09-27: 10 mg via INTRAVENOUS
  Filled 2021-09-27: qty 2

## 2021-09-27 MED ORDER — ACETAMINOPHEN 500 MG PO TABS
1000.0000 mg | ORAL_TABLET | Freq: Once | ORAL | Status: AC
  Administered 2021-09-27: 1000 mg via ORAL
  Filled 2021-09-27: qty 2

## 2021-09-27 NOTE — H&P (Signed)
History and Physical    Brittany Nguyen HXT:[id number] DOB: 12/04/1965 DOA: 09/27/2021  PCP: [name], MD   Patient coming from: Home  I have personally briefly reviewed patient's relevant medical records in [place]  Chief Complaint: Fever, cough, vomiting, generalized malaise  HPI: Brittany Nguyen is a 56 y.o. female with medical history significant for  Systolic CHF( 16-55% 37/4827) secondary to nonischemic cardiomyopathy s/p AICD, OSA on CPAP, mild intermittent asthma, gastric neuroendocrine tumor , chronic abdominal pain of multifactorial etiology on chronic opiates, chronic diarrhea , diabetes, surgical hypothyroidism,seizure disorder on Keppra, anxiety and depression who presents to the ED with a 2-day history of fever, cough, vomiting and generalized malaise.  Diarrhea is at baseline.  She denies abdominal pain beyond baseline.  She denies shortness of breath beyond her baseline.  She reports increased pain due to inability to take her oral pain meds and other medication.  She complains of generalized malaise  ED course: Afebrile, tachycardic to 112, tachypneic to 23 with O2 sat 100% on room air.  BP 105/77  Blood work: WBC 9.3, lactic acid 1.5, procalcitonin pending CMP unremarkable except for mild hypokalemia of 3.4.  Magnesium 2.0 COVID and influenza PCR negative  EKG, personally viewed and interpreted: Sinus at 114 with nonspecific ST-T wave changes Prolonged QT of 611  Chest x-ray: Subtle opacities at the right and left lung base which may represent atelectasis or developing infection, would consider the possibility of viral or atypical process Please see complete report  Due to concern for sepsis, patient started on sepsis fluids, given Rocephin and doxycycline.  Also treated with Reglan given limited choices for antiemetics with prolonged QT.  She continued to feel unwell in the ED.  Hospitalist consulted for admission.  Review of Systems: As per HPI  otherwise all other systems on review of systems negative.   Assessment/Plan    Community acquired pneumonia   SIRS /possible sepsis - Patient has a cough and fevers at home, afebrile in the ED and chest x-ray showing possible atypical pneumonia - Follow procalcitonin to evaluate for bacterial etiology - Follow respiratory viral panel is viral suspected and COVID and flu negative - Received Rocephin and doxycycline in the ED - We will hold off on antibiotics pending procalcitonin result - IV hydration with close monitoring for fluid overload in view of systolic heart failure - Antitussives, albuterol as needed wheezing - Oxygen if needed for O2 sat below 92    Prolonged QT interval - QT interval of 611 - Caution with antiemetics and any other meds prolonged QT interval - Keep magnesium over 2  - Continuous cardiac monitoring    Nausea and vomiting with history of chronic nausea - Per review of past records, etiology undetermined and could be multifactorial in part due to atrophic gastritis - Patient is followed by GI - IV Protonix, IV Compazine as needed, to resume oral Phenergan when able - Clear liquid diet    Chronic diarrhea, possibly with acute worsening - Per review of past records, multifactorial and possibly postcholecystectomy, IBS - Patient is followed by GI - On somatostatin analog - No stool cultures ordered at this time    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 Keppra as appropriate    Neuroendocrine neoplasm of stomach - Followed by heme-onc     Chronic systolic heart failure (HCC)    Nonischemic cardiomyopathy   S/P implantation of automatic cardioverter/defibrillator (AICD) - Currently euvolemic to dry - Resume losartan, metoprolol, spironolactone once tolerating orally --resume furosemide after hydration  Mild intermittent asthma - Patient has been seen by pulmonology in the past - Ventolin as needed  OSA on CPAP - CPAP nightly   DVT prophylaxis: Lovenox  Code Status: full code  Family Communication:  none  Disposition Plan: Back to previous home environment Consults called: none  Status:At the time of admission, it appears that the appropriate admission status for this patient is INPATIENT. This is judged to be reasonable and necessary in order to provide the required intensity of service to ensure the patient's safety given the presenting symptoms, physical exam findings, and initial radiographic and laboratory data in the context of their  Comorbid conditions.   Patient requires inpatient status due to high intensity of service, high risk for further deterioration and high frequency of surveillance required.   I certify that at the point of admission it is my clinical judgment that the patient will require inpatient hospital care spanning beyond 2 midnights     Physical Exam: Vitals:   09/27/21 2028 09/27/21 2221  BP: (!) 117/93 105/77  Pulse: (!) 112 (!) 104  Resp: 20 (!) 23  Temp: 98.7 F (37.1 C) 98.8 F (37.1 C)  TempSrc: Oral Oral  SpO2: 99% 100%   Constitutional: Alert, oriented x 3 . Not in any apparent distress HEENT:      Head: Normocephalic and atraumatic.         Eyes: PERLA, EOMI, Conjunctivae are normal. Sclera is non-icteric.       Mouth/Throat: Mucous membranes are moist.       Neck: Supple with no signs of meningismus. Cardiovascular: Regular rate and rhythm. No murmurs, gallops, or rubs. 2+ symmetrical distal pulses are present . No JVD. No  LE edema Respiratory: Respiratory effort normal  .Lungs sounds clear bilaterally. No wheezes, crackles, or rhonchi.  Gastrointestinal: Soft, non tender, non distended. Positive bowel sounds.  Genitourinary: No CVA tenderness. Musculoskeletal: Nontender with normal range of motion in all extremities. No cyanosis, or erythema of extremities. Neurologic:  Face is symmetric. Moving all extremities. No gross focal neurologic deficits . Skin: Skin is warm, dry.  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    CHF (congestive heart failure) ([place])    COPD (chronic obstructive pulmonary disease) ([place])    Diverticulitis 2010   DVT (deep venous thrombosis) ([place])    Endometriosis 1990   Heart disease 2013   Hx MRSA infection    Iron deficiency    Lump or mass in breast    Restless leg    Seizures ([place])    Stomach cancer ([place])     Past Surgical History:  Procedure Laterality Date   ABDOMINAL HYSTERECTOMY     age 17   BREAST BIOPSY Right 2014   benign   CARDIAC DEFIBRILLATOR PLACEMENT     CARDIAC DEFIBRILLATOR PLACEMENT     CESAREAN SECTION     CHOLECYSTECTOMY     COLECTOMY  COLONOSCOPY WITH PROPOFOL N/A 02/22/2018   Procedure: COLONOSCOPY WITH PROPOFOL;  Surgeon: Toledo, Benay Pike, MD;  Location: [facility] ENDOSCOPY;  Service: Gastroenterology;  Laterality: N/A;   ESOPHAGOGASTRODUODENOSCOPY (EGD) WITH PROPOFOL N/A 02/22/2018   Procedure: ESOPHAGOGASTRODUODENOSCOPY (EGD) WITH PROPOFOL;  Surgeon: Toledo, Benay Pike, MD;  Location: [facility] ENDOSCOPY;  Service: Gastroenterology;  Laterality: N/A;   NASAL SINUS SURGERY  2012   OOPHORECTOMY       reports that she has never smoked. She has never used smokeless tobacco. She reports current drug use. She reports that she does not drink alcohol.  Allergies  Allergen Reactions   Contrast Media [Iodinated Contrast Media] Shortness Of Breath     Per CT report in 2014 pt had break through contrast reaction with hives and SOB following 13 hour prep. MSY    Lidocaine Hives   Metrizamide Shortness Of Breath   Penicillins Hives and Other (See Comments)    Other reaction(s): Other (See Comments) Has patient had a PCN reaction causing immediate rash, facial/tongue/throat swelling, SOB or lightheadedness with hypotension: Yes Has patient had a PCN reaction causing severe rash involving mucus membranes or skin necrosis: No Has patient had a PCN reaction that required hospitalization No Has patient had a PCN reaction occurring within the last 10 years: No If all of the above answers are "NO", then may proceed with Cephalosporin use. Has patient had a PCN reaction causing immediate rash, facial/tongue/throat swelling, SOB or lightheadedness with hypotension: Yes Has patient had a PCN reaction causing severe rash involving mucus membranes or skin necrosis: No Has patient had a PCN reaction that required hospitalization No Has patient had a PCN reaction occurring within the last 10 years: No If all of the above answers are "NO", then may proceed with Cephalosporin use.  ediate rash, facial/tongue/throat swelling, SOB or lightheadedness with hypotension, tachy   Sacubitril-Valsartan     Other reaction(s): Other (See Comments) Sz (unclear if new start entresto vs hypoglycemia)   Isosorbide Nitrate     Other reaction(s): Headache   Latex Hives   Ondansetron Other (See Comments)    Severe headache   Zofran [Ondansetron Hcl] Other (See Comments)    Causes migraines   Povidone-Iodine Rash    Blistering rash   Pulmicort [Budesonide] Itching    Family History  Problem Relation Age of Onset   Cancer Mother 1       ovarian   CAD Mother    Cancer Father 18       brain   CAD Father    Cancer Daughter 19       skin   Cancer Maternal Aunt 49       breast   Leukemia Paternal Grandfather       Prior to Admission medications   Medication  Sig Start Date End Date Taking? Authorizing Provider  albuterol (ACCUNEB) 1.25 MG/3ML nebulizer solution Take 3 mLs by nebulization every 6 (six) hours as needed for wheezing. 11/09/17   [provider]  albuterol (VENTOLIN HFA) 108 (90 Base) MCG/ACT inhaler Inhale 2 puffs into the lungs every 6 (six) hours as needed for shortness of breath. 08/28/16   [provider]  brompheniramine-pseudoephedrine-DM 30-2-10 MG/5ML syrup Take 10 mLs by mouth 4 (four) times daily as needed. Patient not taking: No sig reported 12/06/20   Cuthriell, Charline Bills, PA-C  chlorhexidine (PERIDEX) 0.12 % solution Use as directed 10 mLs in the mouth or throat 2 (two) times daily. Swish and spit Patient not taking: No sig  reported 12/06/20   Cuthriell, Charline Bills, PA-C  famotidine (PEPCID) 20 MG tablet Take 1 tablet (20 mg total) by mouth 2 (two) times daily. 03/15/19   Carrie Mew, MD  fentaNYL (DURAGESIC) 50 MCG/HR Place 1 patch onto the skin every 3 (three) days. 10/08/18   Epifanio Lesches, MD  furosemide (LASIX) 40 MG tablet Take 80 mg by mouth 2 (two) times daily.  07/09/18   [provider]  gabapentin (NEURONTIN) 100 MG capsule Take 100 mg by mouth at bedtime. 06/01/21   [provider]  levETIRAcetam (KEPPRA) 500 MG tablet Take 500 mg by mouth 2 (two) times daily. 02/10/21   [provider]  levothyroxine (SYNTHROID) 200 MCG tablet Take 200 mcg by mouth daily. 04/15/21   [provider]  LORazepam (ATIVAN) 1 MG tablet Take 1 mg by mouth 4 (four) times daily as needed. 12/20/19   [provider]  losartan (COZAAR) 25 MG tablet Take 25 mg by mouth daily.     [provider]  metoprolol succinate (TOPROL-XL) 50 MG 24 hr tablet Take 50 mg by mouth 2 (two) times daily. 01/25/19   [provider]  morphine (MSIR) 15 MG tablet Take 15 mg by mouth as needed for severe pain.     [provider]  Multiple Vitamins-Minerals (ONE-A-DAY WOMENS  PO) Take 1 tablet by mouth daily. Patient not taking: Reported on 06/22/2021    [provider]  nitroGLYCERIN (NITROSTAT) 0.4 MG SL tablet Place 1 tablet (0.4 mg total) under the tongue every 5 (five) minutes as needed for chest pain. 01/24/17   Henreitta Leber, MD  oxyCODONE (OXY IR/ROXICODONE) 5 MG immediate release tablet Take 5-10 mg by mouth every 4 (four) hours as needed for pain. Patient not taking: Reported on 06/22/2021 04/11/19   [provider]  oxyCODONE-acetaminophen (PERCOCET) 5-325 MG tablet Take 1 tablet by mouth every 4 (four) hours as needed. 02/11/20   Rudene Re, MD  oxyCODONE-acetaminophen (PERCOCET/ROXICET) 5-325 MG tablet Take 1 tablet by mouth every 4 (four) hours as needed.  Patient not taking: Reported on 06/22/2021 01/17/20   [provider]  potassium chloride SA (KLOR-CON M20) 20 MEQ tablet Take 1 tablet (20 mEq total) by mouth 2 (two) times daily for 7 days. 07/09/19 01/23/20  Carrie Mew, MD  promethazine (PHENERGAN) 25 MG tablet Take 1 tablet (25 mg total) by mouth every 6 (six) hours as needed. 07/09/19   Carrie Mew, MD  rOPINIRole (REQUIP) 4 MG tablet Take 4 mg by mouth at bedtime. 01/30/19   [provider]  spironolactone (ALDACTONE) 50 MG tablet Take 50 mg by mouth daily. 11/26/18   [provider]  sucralfate (CARAFATE) 1 GM/10ML suspension Take 10 mLs (1 g total) by mouth 4 (four) times daily. Patient not taking: No sig reported 12/06/20 12/06/21  Cuthriell, Charline Bills, PA-C  tiZANidine (ZANAFLEX) 4 MG tablet Take 4 mg by mouth 3 (three) times daily. 06/17/21   [provider]  zonisamide (ZONEGRAN) 100 MG capsule Take 100 mg by mouth 2 (two) times daily. 05/18/21   [provider]      Labs on Admission: I have personally reviewed following labs and imaging studies  CBC: Recent Labs  Lab 09/27/21 2155  WBC 9.3  NEUTROABS 6.6  HGB 14.3  HCT 42.4  MCV 87.4  PLT 563   Basic  Metabolic Panel: Recent Labs  Lab 09/27/21 2155  NA 136  K 3.4*  CL 96*  CO2 29  GLUCOSE 118*  BUN 12  CREATININE 0.86  CALCIUM 8.4*  MG 2.0   GFR: CrCl cannot be calculated (Unknown ideal weight.). Liver Function Tests: Recent Labs  Lab 09/27/21 2155  AST 34  ALT 28  ALKPHOS 96  BILITOT 0.6  PROT 8.1  ALBUMIN 4.4   No results for input(s): LIPASE, AMYLASE in the last 168 hours. No results for input(s): AMMONIA in the last 168 hours. Coagulation Profile: Recent Labs  Lab 09/27/21 2155  INR 1.0   Cardiac Enzymes: No results for input(s): CKTOTAL, CKMB, CKMBINDEX, TROPONINI in the last 168 hours. BNP (last 3 results) No results for input(s): PROBNP in the last 8760 hours. HbA1C: No results for input(s): HGBA1C in the last 72 hours. CBG: No results for input(s): GLUCAP in the last 168 hours. Lipid Profile: No results for input(s): CHOL, HDL, LDLCALC, TRIG, CHOLHDL, LDLDIRECT in the last 72 hours. Thyroid Function Tests: No results for input(s): TSH, T4TOTAL, FREET4, T3FREE, THYROIDAB in the last 72 hours. Anemia Panel: No results for input(s): VITAMINB12, FOLATE, FERRITIN, TIBC, IRON, RETICCTPCT in the last 72 hours. Urine analysis:    Component Value Date/Time   COLORURINE YELLOW (A) 06/26/2021 0134   APPEARANCEUR HAZY (A) 06/26/2021 0134   APPEARANCEUR Clear 09/02/2014 2312   LABSPEC 1.012 06/26/2021 0134   LABSPEC 1.013 09/02/2014 2312   PHURINE 5.0 06/26/2021 0134   GLUCOSEU NEGATIVE 06/26/2021 0134   GLUCOSEU Negative 09/02/2014 2312   HGBUR NEGATIVE 06/26/2021 0134   BILIRUBINUR NEGATIVE 06/26/2021 0134   BILIRUBINUR Negative 09/02/2014 2312   KETONESUR NEGATIVE 06/26/2021 0134   PROTEINUR NEGATIVE 06/26/2021 0134   NITRITE NEGATIVE 06/26/2021 0134   LEUKOCYTESUR TRACE (A) 06/26/2021 0134   LEUKOCYTESUR Negative 09/02/2014 2312    Radiological Exams on Admission: DG Chest 2 View  Result Date: 09/27/2021 CLINICAL DATA:  Cough, fever in a  56 year old female. EXAM: CHEST - 2 VIEW COMPARISON:  June 21, 2021. FINDINGS: Dual lead pacer defibrillator, power pack over LEFT chest, device entering via LEFT-sided approach with similar appearance to prior imaging. Subtle opacities at the RIGHT and LEFT lung base. Mild bronchial wall thickening. No consolidation. No pleural effusion. No pneumothorax. On limited assessment there is no acute skeletal process. IMPRESSION: Subtle opacities at the RIGHT and LEFT lung base may represent atelectasis or developing infection, would consider the possibility of viral or atypical process. No lobar consolidation or effusion. Electronically Signed   By: Zetta Bills M.D.   On: 09/27/2021 21:22       Athena Masse MD [facility]   09/27/2021, 11:23 PM

## 2021-09-27 NOTE — Progress Notes (Signed)
CODE SEPSIS - PHARMACY COMMUNICATION  **Broad Spectrum Antibiotics should be administered within 1 hour of Sepsis diagnosis**  Time Code Sepsis Called/Page Received: 2248  Antibiotics Ordered: Ceftriaxone & Doxycycline  Time of 1st antibiotic administration: [place], PharmD, [facility] 09/27/2021 11:01 PM

## 2021-09-27 NOTE — ED Provider Notes (Signed)
[facility] Provider Note    Event Date/Time   First MD Initiated Contact with Patient 09/27/21 2044     (approximate)   History   Cough, Fever, and Emesis   HPI  Brittany Nguyen is a 56 y.o. female who presents to the ED for evaluation of Cough, Fever, and Emesis   Review outpatient cardiology visit from 1/17.  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  Patient presents to the ED for the evaluation of fever, cough, emesis and diarrhea.  She reports taking care of a sick grandkid at home with a "really bad cold or a virus or something."  Her husband is sick with a similar syndrome that she is with fever, coughing and feeling generalized malaise.  She reports nausea and inability keep down her chronic pain medications, with 1 day of emesis and diarrhea that is watery and nonbloody.  Up to 6 episodes per day.  Reports nonproductive cough, headache and fever up to 102 F.  Denies chest pain, syncope or shortness of breath, just cough.   Physical Exam   Triage Vital Signs: ED Triage Vitals [09/27/21 2028]  Enc Vitals Group     BP (!) 117/93     Pulse Rate (!) 112     Resp 20     Temp 98.7 F (37.1 C)     Temp Source Oral     SpO2 99 %     Weight      Height      Head Circumference      Peak Flow      Pain Score      Pain Loc      Pain Edu?      Excl. in [place]?     Most recent vital signs: Vitals:   09/27/21 2028 09/27/21 2221  BP: (!) 117/93 105/77  Pulse: (!) 112 (!) 104  Resp: 20 (!) 23  Temp: 98.7 F (37.1 C) 98.8 F (37.1 C)  SpO2: 99% 100%    General: Awake, no distress.  Obese.  Sitting up in bed, appears uncomfortable.  No distress. CV:  Good peripheral perfusion.  Tachycardic and regular. Resp:  Normal effort.  No wheezing Abd:  No distention.  Benign and nontender throughout MSK:  No deformity noted.   No significant pitting edema noted Neuro:  No focal deficits appreciated. Other:     ED Results / Procedures / Treatments   Labs (all labs ordered are listed, but only abnormal results are displayed) Labs Reviewed  COMPREHENSIVE METABOLIC PANEL - Abnormal; Notable for the following components:      Result Value   Potassium 3.4 (*)    Chloride 96 (*)    Glucose, Bld 118 (*)    Calcium 8.4 (*)    All other components within normal limits  RESP PANEL BY RT-PCR (FLU A&B, COVID) ARPGX2  CULTURE, BLOOD (ROUTINE X 2)  CULTURE, BLOOD (ROUTINE X 2)  URINE CULTURE  CBC WITH DIFFERENTIAL/PLATELET  LACTIC ACID, PLASMA  PROTIME-INR  APTT  LACTIC ACID, PLASMA  URINALYSIS, COMPLETE (UACMP) WITH MICROSCOPIC  PROCALCITONIN  PROCALCITONIN  MAGNESIUM  POC URINE PREG, ED    EKG  Sinus tachycardia with a rate of 114 bpm.  Leftward axis.  Prolonged QTC at 611 ms and no STEMI.  RADIOLOGY 2 view CXR reviewed by me with bibasilar opacities  Official radiology report(s): DG Chest 2  View  Result Date: 09/27/2021 CLINICAL DATA:  Cough, fever in a 56 year old female. EXAM: CHEST - 2 VIEW COMPARISON:  June 21, 2021. FINDINGS: Dual lead pacer defibrillator, power pack over LEFT chest, device entering via LEFT-sided approach with similar appearance to prior imaging. Subtle opacities at the RIGHT and LEFT lung base. Mild bronchial wall thickening. No consolidation. No pleural effusion. No pneumothorax. On limited assessment there is no acute skeletal process. IMPRESSION: Subtle opacities at the RIGHT and LEFT lung base may represent atelectasis or developing infection, would consider the possibility of viral or atypical process. No lobar consolidation or effusion. Electronically Signed   By: Zetta Bills M.D.   On: 09/27/2021 21:22    PROCEDURES and INTERVENTIONS:  .1-3 Lead EKG Interpretation Performed by: Vladimir Crofts, MD Authorized by: Vladimir Crofts, MD     Interpretation: abnormal     ECG  rate:  110   ECG rate assessment: tachycardic     Rhythm: sinus tachycardia     Ectopy: none     Conduction: normal   .Critical Care Performed by: Vladimir Crofts, MD Authorized by: Vladimir Crofts, MD   Critical care provider statement:    Critical care time (minutes):  30   Critical care time was exclusive of:  Separately billable procedures and treating other patients   Critical care was necessary to treat or prevent imminent or life-threatening deterioration of the following conditions:  Sepsis   Critical care was time spent personally by me on the following activities:  Development of treatment plan with patient or surrogate, discussions with consultants, evaluation of patient's response to treatment, examination of patient, ordering and review of laboratory studies, ordering and review of radiographic studies, ordering and performing treatments and interventions, pulse oximetry, re-evaluation of patient's condition and review of old charts  Medications  lactated ringers infusion (has no administration in time range)  cefTRIAXone (ROCEPHIN) 2 g in sodium chloride 0.9 % 100 mL IVPB (has no administration in time range)  doxycycline (VIBRAMYCIN) 100 mg in sodium chloride 0.9 % 250 mL IVPB (has no administration in time range)  lactated ringers bolus 500 mL (0 mLs Intravenous Stopped 09/27/21 2222)  morphine 4 MG/ML injection 6 mg (6 mg Intravenous Given 09/27/21 2150)  acetaminophen (TYLENOL) tablet 1,000 mg (1,000 mg Oral Given 09/27/21 2149)  metoCLOPramide (REGLAN) injection 10 mg (10 mg Intravenous Given 09/27/21 2218)     IMPRESSION / MDM / [place] / ED COURSE  I reviewed the triage vital signs and the nursing notes.  56 year old female with multiple medical comorbidities presents to the ED with fever and cough, with evidence of sepsis due to CAP requiring medical observation admission.  She is tachycardic and tachypneic, but hemodynamically stable and not hypoxic.  Exam with  an uncomfortable obese patient without gross evidence of volume overload.  Blood work is fairly benign without leukocytosis or renal dysfunction.  Mild hypokalemia.  No severe sepsis or lactic acidosis.  X-ray with bibasilar infiltrates concerning for CAP.  EKG has quite a prolonged QTC at 611 ms and magnesium level is pending, patient has been maintained on the monitor.  Considering her comorbidities and severity of symptoms, I think she would benefit from observation admission.  We will consult with hospitalist to facilitate this.  Clinical Course as of 09/27/21 2248  Mon Sep 27, 2021  2129 Reassessed after x-ray and expressed my concerns.  We discussed pneumonia and increasing evidence that she may need to be hospitalized.  She is in agreement. [  DS]    Clinical Course User Index [DS] Vladimir Crofts, MD     FINAL CLINICAL IMPRESSION(S) / ED DIAGNOSES   Final diagnoses:  Sepsis without acute organ dysfunction, due to unspecified organism ([place])  QT prolongation  Community acquired pneumonia of right lower lobe of lung     Rx / DC Orders   ED Discharge Orders     None        Note:  This document was prepared using Dragon voice recognition software and may include unintentional dictation errors.   Vladimir Crofts, MD 09/27/21 2250

## 2021-09-27 NOTE — ED Triage Notes (Signed)
Pt presents via POV c/o cough, fever, vomiting x2 days. Ambulatory to triage.

## 2021-09-27 NOTE — Sepsis Progress Note (Signed)
Elink following Code Sepsis. 

## 2021-09-28 ENCOUNTER — Encounter: Payer: Self-pay | Admitting: Oncology

## 2021-09-28 DIAGNOSIS — B348 Other viral infections of unspecified site: Secondary | ICD-10-CM | POA: Diagnosis not present

## 2021-09-28 DIAGNOSIS — K529 Noninfective gastroenteritis and colitis, unspecified: Secondary | ICD-10-CM | POA: Diagnosis not present

## 2021-09-28 DIAGNOSIS — Z79891 Long term (current) use of opiate analgesic: Secondary | ICD-10-CM

## 2021-09-28 DIAGNOSIS — I5022 Chronic systolic (congestive) heart failure: Secondary | ICD-10-CM

## 2021-09-28 DIAGNOSIS — A419 Sepsis, unspecified organism: Secondary | ICD-10-CM | POA: Diagnosis not present

## 2021-09-28 LAB — MAGNESIUM: Magnesium: 1.9 mg/dL (ref 1.7–2.4)

## 2021-09-28 LAB — CBC
HCT: 36.1 % (ref 36.0–46.0)
Hemoglobin: 12.2 g/dL (ref 12.0–15.0)
MCH: 29.1 pg (ref 26.0–34.0)
MCHC: 33.8 g/dL (ref 30.0–36.0)
MCV: 86.2 fL (ref 80.0–100.0)
Platelets: 201 10*3/uL (ref 150–400)
RBC: 4.19 MIL/uL (ref 3.87–5.11)
RDW: 13 % (ref 11.5–15.5)
WBC: 6 10*3/uL (ref 4.0–10.5)
nRBC: 0 % (ref 0.0–0.2)

## 2021-09-28 LAB — RESPIRATORY PANEL BY PCR
Adenovirus: NOT DETECTED
Bordetella Parapertussis: NOT DETECTED
Bordetella pertussis: NOT DETECTED
Chlamydophila pneumoniae: NOT DETECTED
Coronavirus 229E: NOT DETECTED
Coronavirus HKU1: NOT DETECTED
Coronavirus NL63: NOT DETECTED
Coronavirus OC43: NOT DETECTED
Influenza A: NOT DETECTED
Influenza B: NOT DETECTED
Metapneumovirus: NOT DETECTED
Mycoplasma pneumoniae: NOT DETECTED
Parainfluenza Virus 1: DETECTED — AB
Parainfluenza Virus 2: NOT DETECTED
Parainfluenza Virus 3: NOT DETECTED
Parainfluenza Virus 4: NOT DETECTED
Respiratory Syncytial Virus: NOT DETECTED
Rhinovirus / Enterovirus: NOT DETECTED

## 2021-09-28 LAB — BASIC METABOLIC PANEL
Anion gap: 9 (ref 5–15)
BUN: 12 mg/dL (ref 6–20)
CO2: 28 mmol/L (ref 22–32)
Calcium: 7.8 mg/dL — ABNORMAL LOW (ref 8.9–10.3)
Chloride: 101 mmol/L (ref 98–111)
Creatinine, Ser: 0.76 mg/dL (ref 0.44–1.00)
GFR, Estimated: >60 mL/min (ref >60)
Glucose, Bld: 118 mg/dL — ABNORMAL HIGH (ref 70–99)
Potassium: 3.3 mmol/L — ABNORMAL LOW (ref 3.5–5.1)
Sodium: 138 mmol/L (ref 135–145)

## 2021-09-28 LAB — LACTIC ACID, PLASMA: Lactic Acid, Venous: 1.9 mmol/L (ref 0.5–1.9)

## 2021-09-28 LAB — PROCALCITONIN: Procalcitonin: <0.1 ng/mL

## 2021-09-28 MED ORDER — FENTANYL 50 MCG/HR TD PT72: 1.0000 | MEDICATED_PATCH | TRANSDERMAL | Status: DC

## 2021-09-28 MED ORDER — ACETAMINOPHEN 650 MG RE SUPP: 650.0000 mg | Freq: Four times a day (QID) | RECTAL | Status: DC | PRN

## 2021-09-28 MED ORDER — HYDROCOD POLI-CHLORPHE POLI ER 10-8 MG/5ML PO SUER
5.0000 mL | Freq: Two times a day (BID) | ORAL | Status: DC | PRN
  Administered 2021-09-28: 5 mL via ORAL
  Filled 2021-09-28: qty 5

## 2021-09-28 MED ORDER — ACETAMINOPHEN 325 MG PO TABS: 650.0000 mg | ORAL_TABLET | Freq: Four times a day (QID) | ORAL | Status: DC | PRN

## 2021-09-28 MED ORDER — ENOXAPARIN SODIUM 40 MG/0.4ML IJ SOSY
40.0000 mg | PREFILLED_SYRINGE | INTRAMUSCULAR | Status: DC
  Administered 2021-09-28: 09:00:00 40 mg via SUBCUTANEOUS
  Filled 2021-09-28: qty 0.4

## 2021-09-28 MED ORDER — ALBUTEROL SULFATE (2.5 MG/3ML) 0.083% IN NEBU: 3.0000 mL | INHALATION_SOLUTION | Freq: Four times a day (QID) | RESPIRATORY_TRACT | Status: DC | PRN

## 2021-09-28 MED ORDER — MORPHINE SULFATE (PF) 2 MG/ML IV SOLN
2.0000 mg | INTRAVENOUS | Status: DC | PRN
  Administered 2021-09-28 (×2): 2 mg via INTRAVENOUS
  Filled 2021-09-28 (×3): qty 1

## 2021-09-28 MED ORDER — PANTOPRAZOLE SODIUM 40 MG IV SOLR
40.0000 mg | INTRAVENOUS | Status: DC
  Administered 2021-09-28: 02:00:00 40 mg via INTRAVENOUS
  Filled 2021-09-28: qty 40

## 2021-09-28 MED ORDER — PROCHLORPERAZINE EDISYLATE 10 MG/2ML IJ SOLN: 10.0000 mg | Freq: Four times a day (QID) | INTRAMUSCULAR | Status: DC | PRN

## 2021-09-28 MED ORDER — SODIUM CHLORIDE 0.9 % IV SOLN: INTRAVENOUS | Status: AC

## 2021-09-28 MED ORDER — LEVETIRACETAM 500 MG PO TABS
500.0000 mg | ORAL_TABLET | Freq: Two times a day (BID) | ORAL | Status: DC
  Administered 2021-09-28: 09:00:00 500 mg via ORAL
  Filled 2021-09-28: qty 1

## 2021-09-28 NOTE — Care Management CC44 (Signed)
Condition Code 44 Documentation Completed  Patient Details  Name: Brittany Nguyen MRN: [id number] Date of Birth: [birth year]   Condition Code 44 given:  Yes Patient signature on Condition Code 44 notice:  Yes Documentation of 2 MD's agreement:  Yes Code 44 added to claim:  Yes    Gerrianne Scale Lorena Benham, LCSW 09/28/2021, 1:13 PM

## 2021-09-28 NOTE — Plan of Care (Signed)
  Problem: Activity: Goal: Ability to tolerate increased activity will improve Outcome: Progressing   Problem: Clinical Measurements: Goal: Ability to maintain a body temperature in the normal range will improve Outcome: Progressing   Problem: Respiratory: Goal: Ability to maintain adequate ventilation will improve Outcome: Progressing Goal: Ability to maintain a clear airway will improve Outcome: Progressing   

## 2021-09-30 NOTE — Discharge Summary (Signed)
Physician Discharge Summary   Patient: Brittany Nguyen MRN: [id number] DOB: 1965/11/25  Admit date:     09/27/2021  Discharge date: 09/28/2021  Discharge Physician: Max Sane   PCP: [name], MD   Recommendations at discharge:    F/up with outpt providers as requested  Discharge Diagnoses Principal Problem:   Community acquired pneumonia Active Problems:   Chronic systolic heart failure ([place])   COPD (chronic obstructive pulmonary disease) ([place])   S/P implantation of automatic cardioverter/defibrillator (AICD)   Post-surgical hypothyroidism   Chronic use of opiate for therapeutic purpose   Type 2 diabetes mellitus without complication (HCC)   Seizure disorder (HCC)   Chronic diarrhea   SIRS (systemic inflammatory response syndrome) (HCC)   Prolonged QT interval   Nausea and vomiting   Neuroendocrine neoplasm of stomach   Parainfluenza infection   Hospital Course   56 y.o. female with medical history significant for  Systolic CHF( 71-24% 58/0998) secondary to nonischemic cardiomyopathy s/p AICD, OSA on CPAP, mild intermittent asthma, gastric neuroendocrine tumor , chronic abdominal pain of multifactorial etiology on chronic opiates, chronic diarrhea , diabetes, surgical hypothyroidism,seizure disorder on Keppra, anxiety and depression who was admitted with a 2-day history of fever, cough, vomiting and generalized malaise and was diagnosed with Parainfluenza + in respi panel  Community acquired pneumonia   SIRS /possible sepsis - This was thought to be viral infection. Parainfluenza + in viral panel. - Patient was agreeable symptomatic mgmt at home.     Prolonged QT interval - QT interval stable     Nausea and vomiting with history of chronic nausea - Per review of past records, etiology undetermined and could be multifactorial in part due to atrophic gastritis - Patient is followed by GI - she is tolerating diet without any further N/V while in the hospital      Chronic diarrhea, possibly with acute worsening - Per review of past records, multifactorial and possibly postcholecystectomy, IBS - Patient is followed by GI - On somatostatin analog     Post-surgical hypothyroidism secondary to multinodular goiter - on levothyroxine    Chronic abdominal pain, multifactorial Chronic use of opiate for therapeutic purpose - Continue home meds     Seizure disorder ([place]) - on Keppra     Neuroendocrine neoplasm of stomach - Followed by heme-onc     Chronic systolic heart failure (HCC)    Nonischemic cardiomyopathy   S/P implantation of automatic cardioverter/defibrillator (AICD) - Currently euvolemic  - I requested her/family to consider holding BP meds as BP remains soft/low for next few days at D/C but they were not in agreement.   Mild intermittent asthma - outpt pulmo f/up as need   OSA on CPAP - CPAP nightly     Disposition: Home Diet recommendation: Carb modified diet  DISCHARGE MEDICATION: Allergies as of 09/28/2021       Reactions   Contrast Media [iodinated Contrast Media] Shortness Of Breath   Per CT report in 2014 pt had break through contrast reaction with hives and SOB following 13 hour prep. MSY    Lidocaine Hives   Metrizamide Shortness Of Breath   Penicillins Hives, Other (See Comments)   Other reaction(s): Other (See Comments) Has patient had a PCN reaction causing immediate rash, facial/tongue/throat swelling, SOB or lightheadedness with hypotension: Yes Has patient had a PCN reaction causing severe rash involving mucus membranes or skin necrosis: No Has patient had a PCN reaction that required hospitalization No Has patient had a PCN reaction occurring  within the last 10 years: No If all of the above answers are "NO", then may proceed with Cephalosporin use. Has patient had a PCN reaction causing immediate rash, facial/tongue/throat swelling, SOB or lightheadedness with hypotension: Yes Has patient had a PCN reaction  causing severe rash involving mucus membranes or skin necrosis: No Has patient had a PCN reaction that required hospitalization No Has patient had a PCN reaction occurring within the last 10 years: No If all of the above answers are "NO", then may proceed with Cephalosporin use. ediate rash, facial/tongue/throat swelling, SOB or lightheadedness with hypotension, tachy   Sacubitril-valsartan    Other reaction(s): Other (See Comments) Sz (unclear if new start entresto vs hypoglycemia)   Isosorbide Nitrate    Other reaction(s): Headache   Latex Hives   Ondansetron Other (See Comments)   Severe headache   Zofran [ondansetron Hcl] Other (See Comments)   Causes migraines   Povidone-iodine Rash   Blistering rash   Pulmicort [budesonide] Itching        Medication List     STOP taking these medications    albuterol 1.25 MG/3ML nebulizer solution Commonly known as: ACCUNEB   albuterol 108 (90 Base) MCG/ACT inhaler Commonly known as: VENTOLIN HFA   nitroGLYCERIN 0.4 MG SL tablet Commonly known as: NITROSTAT       TAKE these medications    fentaNYL 50 MCG/HR Commonly known as: Williams 1 patch onto the skin every 3 (three) days.   furosemide 40 MG tablet Commonly known as: LASIX Take 80 mg by mouth 2 (two) times daily.   gabapentin 100 MG capsule Commonly known as: NEURONTIN Take 100 mg by mouth at bedtime.   levETIRAcetam 500 MG tablet Commonly known as: KEPPRA Take 500 mg by mouth 2 (two) times daily.   levothyroxine 200 MCG tablet Commonly known as: SYNTHROID Take 200 mcg by mouth daily.   LORazepam 1 MG tablet Commonly known as: ATIVAN Take 1 mg by mouth 4 (four) times daily as needed.   losartan 25 MG tablet Commonly known as: COZAAR Take 25 mg by mouth daily.   metoprolol succinate 50 MG 24 hr tablet Commonly known as: TOPROL-XL Take 50 mg by mouth 2 (two) times daily.   morphine 15 MG tablet Commonly known as: MSIR Take 15 mg by mouth as  needed for severe pain.   oxyCODONE-acetaminophen 5-325 MG tablet Commonly known as: PERCOCET/ROXICET Take 1-2 tablets by mouth every 4 (four) hours as needed.   oxyCODONE-acetaminophen 5-325 MG tablet Commonly known as: Percocet Take 1 tablet by mouth every 4 (four) hours as needed.   potassium chloride SA 20 MEQ tablet Commonly known as: Klor-Con M20 Take 1 tablet (20 mEq total) by mouth 2 (two) times daily for 7 days.   promethazine 25 MG tablet Commonly known as: PHENERGAN Take 1 tablet (25 mg total) by mouth every 6 (six) hours as needed.   rOPINIRole 4 MG tablet Commonly known as: REQUIP Take 4 mg by mouth at bedtime.   spironolactone 50 MG tablet Commonly known as: ALDACTONE Take 50 mg by mouth daily.        Follow-up Information     Corey Skains, MD. Go on 10/05/2021.   Specialty: Cardiology Why: [facility] Discharge F/UP 3:30pm the office in [facility] information: [address] [place] [place] [address] [phone]         [name], MD. Schedule an appointment as soon as possible for a visit in 1 week(s).  Specialty: Family Medicine Why: [facility] Discharge F/UP left a message for them to call the patient back Contact information: [address] [place] [address] [phone]         Efrain Sella, MD. Go on 11/16/2021.   Specialty: Gastroenterology Why: [facility] Discharge F/UP 2:30pm appointment Contact information: [place] [address] [phone]         Romero Belling, MD. Go on 10/19/2021.   Specialty: Internal Medicine Why: 11:50 labs and 12:30 with Dr. Fritzi Mandes information: [place] [address] [phone]                 Discharge Exam: Filed Weights   09/28/21 0133  Weight: 84.3 kg    Constitutional: Alert, oriented x 3 . Not in any apparent distress HEENT:       Head: Normocephalic and atraumatic.         Eyes: PERLA, EOMI, Conjunctivae are normal. Sclera is non-icteric.       Mouth/Throat: Mucous membranes are moist.       Neck: Supple with no signs of meningismus. Cardiovascular: Regular rate and rhythm. No murmurs, gallops, or rubs. 2+ symmetrical distal pulses are present . No JVD. No  LE edema Respiratory: Respiratory effort normal .Lungs sounds clear bilaterally. No wheezes, crackles, or rhonchi.  Gastrointestinal: Soft, non tender, non distended. Positive bowel sounds.  Genitourinary: No CVA tenderness. Musculoskeletal: Nontender with normal range of motion in all extremities. No cyanosis, or erythema of extremities. Neurologic:  Face is symmetric. Moving all extremities. No gross focal neurologic deficits . Skin: Skin is warm, dry.  No rash or ulcers Psychiatric: Mood and affect are appropriate   Condition at discharge: good  The results of significant diagnostics from this hospitalization (including imaging, microbiology, ancillary and laboratory) are listed below for reference.   Imaging Studies: DG Chest 2 View  Result Date: 09/27/2021 CLINICAL DATA:  Cough, fever in a 56 year old female. EXAM: CHEST - 2 VIEW COMPARISON:  June 21, 2021. FINDINGS: Dual lead pacer defibrillator, power pack over LEFT chest, device entering via LEFT-sided approach with similar appearance to prior imaging. Subtle opacities at the RIGHT and LEFT lung base. Mild bronchial wall thickening. No consolidation. No pleural effusion. No pneumothorax. On limited assessment there is no acute skeletal process. IMPRESSION: Subtle opacities at the RIGHT and LEFT lung base may represent atelectasis or developing infection, would consider the possibility of viral or atypical process. No lobar consolidation or effusion. Electronically Signed   By: Zetta Bills M.D.   On: 09/27/2021 21:22    Microbiology: Results for orders placed or performed during the hospital encounter  of 09/27/21  Resp Panel by RT-PCR (Flu A&B, Covid) Nasopharyngeal Swab     Status: None   Collection Time: 09/27/21  8:31 PM   Specimen: Nasopharyngeal Swab; Nasopharyngeal(NP) swabs in vial transport medium  Result Value Ref Range Status   SARS Coronavirus 2 by RT PCR NEGATIVE NEGATIVE Final    Comment: (NOTE) SARS-CoV-2 target nucleic acids are NOT DETECTED.  The SARS-CoV-2 RNA is generally detectable in upper respiratory specimens during the acute phase of infection. The lowest concentration of SARS-CoV-2 viral copies this assay can detect is 138 copies/mL. A negative result does not preclude SARS-Cov-2 infection and should not be used as the sole basis for treatment or other patient management decisions. A negative result may occur with  improper specimen collection/handling, submission of specimen other than nasopharyngeal swab, presence  of viral mutation(s) within the areas targeted by this assay, and inadequate number of viral copies(<138 copies/mL). A negative result must be combined with clinical observations, patient history, and epidemiological information. The expected result is Negative.  Fact Sheet for Patients:  EntrepreneurPulse.com.au  Fact Sheet for Healthcare Providers:  IncredibleEmployment.be  This test is no t yet approved or cleared by the [place] FDA and  has been authorized for detection and/or diagnosis of SARS-CoV-2 by FDA under an Emergency Use Authorization (EUA). This EUA will remain  in effect (meaning this test can be used) for the duration of the COVID-19 declaration under Section 564(b)(1) of the Act, 21 U.S.C.section 360bbb-3(b)(1), unless the authorization is terminated  or revoked sooner.       Influenza A by PCR NEGATIVE NEGATIVE Final   Influenza B by PCR NEGATIVE NEGATIVE Final    Comment: (NOTE) The Xpert Xpress SARS-CoV-2/FLU/RSV plus assay is intended as an aid in the diagnosis of influenza  from Nasopharyngeal swab specimens and should not be used as a sole basis for treatment. Nasal washings and aspirates are unacceptable for Xpert Xpress SARS-CoV-2/FLU/RSV testing.  Fact Sheet for Patients: EntrepreneurPulse.com.au  Fact Sheet for Healthcare Providers: IncredibleEmployment.be  This test is not yet approved or cleared by the [place] FDA and has been authorized for detection and/or diagnosis of SARS-CoV-2 by FDA under an Emergency Use Authorization (EUA). This EUA will remain in effect (meaning this test can be used) for the duration of the COVID-19 declaration under Section 564(b)(1) of the Act, 21 U.S.C. section 360bbb-3(b)(1), unless the authorization is terminated or revoked.  Performed at [facility], [place], [place] [address]   Respiratory (~20 pathogens) panel by PCR     Status: Abnormal   Collection Time: 09/27/21  8:32 PM   Specimen: Nasopharyngeal Swab; Respiratory  Result Value Ref Range Status   Adenovirus NOT DETECTED NOT DETECTED Final   Coronavirus 229E NOT DETECTED NOT DETECTED Final    Comment: (NOTE) The Coronavirus on the Respiratory Panel, DOES NOT test for the novel  Coronavirus (2019 nCoV)    Coronavirus HKU1 NOT DETECTED NOT DETECTED Final   Coronavirus NL63 NOT DETECTED NOT DETECTED Final   Coronavirus OC43 NOT DETECTED NOT DETECTED Final   Metapneumovirus NOT DETECTED NOT DETECTED Final   Rhinovirus / Enterovirus NOT DETECTED NOT DETECTED Final   Influenza A NOT DETECTED NOT DETECTED Final   Influenza B NOT DETECTED NOT DETECTED Final   Parainfluenza Virus 1 DETECTED (A) NOT DETECTED Final   Parainfluenza Virus 2 NOT DETECTED NOT DETECTED Final   Parainfluenza Virus 3 NOT DETECTED NOT DETECTED Final   Parainfluenza Virus 4 NOT DETECTED NOT DETECTED Final   Respiratory Syncytial Virus NOT DETECTED NOT DETECTED Final   Bordetella pertussis NOT DETECTED NOT DETECTED  Final   Bordetella Parapertussis NOT DETECTED NOT DETECTED Final   Chlamydophila pneumoniae NOT DETECTED NOT DETECTED Final   Mycoplasma pneumoniae NOT DETECTED NOT DETECTED Final    Comment: Performed at [facility], [place]. [address] [place], [place] [address]  Blood Culture (routine x 2)     Status: None (Preliminary result)   Collection Time: 09/27/21  9:55 PM   Specimen: BLOOD  Result Value Ref Range Status   Specimen Description BLOOD RIGHT ASSIST CONTROL  Final   Special Requests   Final    BOTTLES DRAWN AEROBIC AND ANAEROBIC Blood Culture results may not be optimal due to an excessive volume of blood received in  culture bottles   Culture   Final    NO GROWTH 3 DAYS Performed at [facility], [address] [place], [place] [address]    Report Status PENDING  Incomplete  Blood Culture (routine x 2)     Status: None (Preliminary result)   Collection Time: 09/27/21  9:55 PM   Specimen: BLOOD  Result Value Ref Range Status   Specimen Description BLOOD LEFT ASSIST CONTROL  Final   Special Requests   Final    BOTTLES DRAWN AEROBIC AND ANAEROBIC Blood Culture results may not be optimal due to an excessive volume of blood received in culture bottles   Culture   Final    NO GROWTH 3 DAYS Performed at [facility], [address] [place], [place] [address]    Report Status PENDING  Incomplete    Labs: CBC: Recent Labs  Lab 09/27/21 2155 09/28/21 0455  WBC 9.3 6.0  NEUTROABS 6.6  --   HGB 14.3 12.2  HCT 42.4 36.1  MCV 87.4 86.2  PLT 247 157   Basic Metabolic Panel: Recent Labs  Lab 09/27/21 2155 09/28/21 0455  NA 136 138  K 3.4* 3.3*  CL 96* 101  CO2 29 28  GLUCOSE 118* 118*  BUN 12 12  CREATININE 0.86 0.76  CALCIUM 8.4* 7.8*  MG 2.0 1.9   Liver Function Tests: Recent Labs  Lab 09/27/21 2155  AST 34  ALT 28  ALKPHOS 96  BILITOT 0.6  PROT 8.1  ALBUMIN 4.4   CBG: No results for input(s): GLUCAP in the last 168  hours.  Discharge time spent: greater than 30 minutes.  Signed: Max Sane, MD [facility] 09/30/2021

## 2021-10-02 LAB — CULTURE, BLOOD (ROUTINE X 2)
Culture: NO GROWTH
Culture: NO GROWTH

## 2021-11-07 ENCOUNTER — Encounter: Payer: Self-pay | Admitting: Oncology

## 2021-11-07 ENCOUNTER — Other Ambulatory Visit: Payer: Self-pay

## 2021-11-07 ENCOUNTER — Encounter: Payer: Self-pay | Admitting: Emergency Medicine

## 2021-11-07 ENCOUNTER — Emergency Department: Payer: Medicare Other

## 2021-11-07 ENCOUNTER — Inpatient Hospital Stay
Admission: EM | Admit: 2021-11-07 | Discharge: 2021-11-10 | DRG: 101 | Disposition: A | Discharge status: Home / Self Care | Payer: Medicare Other | Attending: Internal Medicine | Admitting: Internal Medicine

## 2021-11-07 DIAGNOSIS — J45909 Unspecified asthma, uncomplicated: Secondary | ICD-10-CM

## 2021-11-07 DIAGNOSIS — Z72820 Sleep deprivation: Secondary | ICD-10-CM

## 2021-11-07 DIAGNOSIS — G40909 Epilepsy, unspecified, not intractable, without status epilepticus: Secondary | ICD-10-CM | POA: Diagnosis present

## 2021-11-07 DIAGNOSIS — F064 Anxiety disorder due to known physiological condition: Secondary | ICD-10-CM | POA: Diagnosis present

## 2021-11-07 DIAGNOSIS — R569 Unspecified convulsions: Secondary | ICD-10-CM | POA: Diagnosis present

## 2021-11-07 DIAGNOSIS — Z880 Allergy status to penicillin: Secondary | ICD-10-CM

## 2021-11-07 DIAGNOSIS — G40919 Epilepsy, unspecified, intractable, without status epilepticus: Secondary | ICD-10-CM | POA: Insufficient documentation

## 2021-11-07 DIAGNOSIS — Z79899 Other long term (current) drug therapy: Secondary | ICD-10-CM

## 2021-11-07 DIAGNOSIS — I959 Hypotension, unspecified: Secondary | ICD-10-CM

## 2021-11-07 DIAGNOSIS — R4182 Altered mental status, unspecified: Secondary | ICD-10-CM | POA: Diagnosis present

## 2021-11-07 DIAGNOSIS — Z8249 Family history of ischemic heart disease and other diseases of the circulatory system: Secondary | ICD-10-CM

## 2021-11-07 DIAGNOSIS — Z91041 Radiographic dye allergy status: Secondary | ICD-10-CM | POA: Diagnosis not present

## 2021-11-07 DIAGNOSIS — R079 Chest pain, unspecified: Secondary | ICD-10-CM | POA: Diagnosis not present

## 2021-11-07 DIAGNOSIS — E876 Hypokalemia: Secondary | ICD-10-CM

## 2021-11-07 DIAGNOSIS — Z9189 Other specified personal risk factors, not elsewhere classified: Secondary | ICD-10-CM

## 2021-11-07 DIAGNOSIS — E1142 Type 2 diabetes mellitus with diabetic polyneuropathy: Secondary | ICD-10-CM | POA: Diagnosis present

## 2021-11-07 DIAGNOSIS — D3A8 Other benign neuroendocrine tumors: Secondary | ICD-10-CM

## 2021-11-07 DIAGNOSIS — J449 Chronic obstructive pulmonary disease, unspecified: Secondary | ICD-10-CM | POA: Diagnosis present

## 2021-11-07 DIAGNOSIS — Z91040 Latex allergy status: Secondary | ICD-10-CM

## 2021-11-07 DIAGNOSIS — Z20822 Contact with and (suspected) exposure to covid-19: Secondary | ICD-10-CM | POA: Diagnosis present

## 2021-11-07 DIAGNOSIS — Z888 Allergy status to other drugs, medicaments and biological substances status: Secondary | ICD-10-CM | POA: Diagnosis not present

## 2021-11-07 DIAGNOSIS — G2581 Restless legs syndrome: Secondary | ICD-10-CM | POA: Diagnosis present

## 2021-11-07 DIAGNOSIS — C169 Malignant neoplasm of stomach, unspecified: Secondary | ICD-10-CM | POA: Diagnosis present

## 2021-11-07 DIAGNOSIS — G629 Polyneuropathy, unspecified: Secondary | ICD-10-CM

## 2021-11-07 DIAGNOSIS — Z6831 Body mass index (BMI) 31.0-31.9, adult: Secondary | ICD-10-CM

## 2021-11-07 DIAGNOSIS — Z95810 Presence of automatic (implantable) cardiac defibrillator: Secondary | ICD-10-CM | POA: Diagnosis not present

## 2021-11-07 DIAGNOSIS — F419 Anxiety disorder, unspecified: Secondary | ICD-10-CM

## 2021-11-07 DIAGNOSIS — Z806 Family history of leukemia: Secondary | ICD-10-CM | POA: Diagnosis not present

## 2021-11-07 DIAGNOSIS — E669 Obesity, unspecified: Secondary | ICD-10-CM | POA: Diagnosis present

## 2021-11-07 DIAGNOSIS — Z79890 Hormone replacement therapy (postmenopausal): Secondary | ICD-10-CM

## 2021-11-07 DIAGNOSIS — G43909 Migraine, unspecified, not intractable, without status migrainosus: Secondary | ICD-10-CM

## 2021-11-07 DIAGNOSIS — I5042 Chronic combined systolic (congestive) and diastolic (congestive) heart failure: Secondary | ICD-10-CM | POA: Diagnosis present

## 2021-11-07 LAB — CBC WITH DIFFERENTIAL/PLATELET
Abs Immature Granulocytes: 0.02 10*3/uL (ref 0.00–0.07)
Basophils Absolute: 0 10*3/uL (ref 0.0–0.1)
Basophils Relative: 0 %
Eosinophils Absolute: 0 10*3/uL (ref 0.0–0.5)
Eosinophils Relative: 1 %
HCT: 40.8 % (ref 36.0–46.0)
Hemoglobin: 13.6 g/dL (ref 12.0–15.0)
Immature Granulocytes: 0 %
Lymphocytes Relative: 15 %
Lymphs Abs: 1 10*3/uL (ref 0.7–4.0)
MCH: 29.2 pg (ref 26.0–34.0)
MCHC: 33.3 g/dL (ref 30.0–36.0)
MCV: 87.6 fL (ref 80.0–100.0)
Monocytes Absolute: 0.3 10*3/uL (ref 0.1–1.0)
Monocytes Relative: 4 %
Neutro Abs: 5.4 10*3/uL (ref 1.7–7.7)
Neutrophils Relative %: 80 %
Platelets: 217 10*3/uL (ref 150–400)
RBC: 4.66 MIL/uL (ref 3.87–5.11)
RDW: 13.2 % (ref 11.5–15.5)
WBC: 6.8 10*3/uL (ref 4.0–10.5)
nRBC: 0 % (ref 0.0–0.2)

## 2021-11-07 LAB — RESP PANEL BY RT-PCR (FLU A&B, COVID) ARPGX2
Influenza A by PCR: NEGATIVE
Influenza B by PCR: NEGATIVE
SARS Coronavirus 2 by RT PCR: NEGATIVE

## 2021-11-07 LAB — BASIC METABOLIC PANEL
Anion gap: 10 (ref 5–15)
BUN: 13 mg/dL (ref 6–20)
CO2: 25 mmol/L (ref 22–32)
Calcium: 9.2 mg/dL (ref 8.9–10.3)
Chloride: 106 mmol/L (ref 98–111)
Creatinine, Ser: 0.93 mg/dL (ref 0.44–1.00)
GFR, Estimated: >60 mL/min (ref >60)
Glucose, Bld: 140 mg/dL — ABNORMAL HIGH (ref 70–99)
Potassium: 3.7 mmol/L (ref 3.5–5.1)
Sodium: 141 mmol/L (ref 135–145)

## 2021-11-07 LAB — MAGNESIUM: Magnesium: 2.4 mg/dL (ref 1.7–2.4)

## 2021-11-07 MED ORDER — METOPROLOL SUCCINATE ER 50 MG PO TB24
50.0000 mg | ORAL_TABLET | Freq: Two times a day (BID) | ORAL | Status: DC
Start: 2021-11-07 — End: 2021-11-09
  Administered 2021-11-07 – 2021-11-09 (×3): 50 mg via ORAL
  Filled 2021-11-07 (×4): qty 1

## 2021-11-07 MED ORDER — SODIUM CHLORIDE 0.9 % IV SOLN
200.0000 mg | Freq: Once | INTRAVENOUS | Status: AC
  Administered 2021-11-07: 200 mg via INTRAVENOUS
  Filled 2021-11-07: qty 20

## 2021-11-07 MED ORDER — LORAZEPAM 2 MG/ML IJ SOLN
1.0000 mg | Freq: Once | INTRAMUSCULAR | Status: AC
  Administered 2021-11-07: 1 mg via INTRAVENOUS
  Filled 2021-11-07: qty 1

## 2021-11-07 MED ORDER — ORAL CARE MOUTH RINSE
15.0000 mL | OROMUCOSAL | Status: DC
  Administered 2021-11-07 – 2021-11-09 (×5): 15 mL via OROMUCOSAL
  Filled 2021-11-07 (×8): qty 15

## 2021-11-07 MED ORDER — LORAZEPAM 2 MG/ML IJ SOLN
INTRAMUSCULAR | Status: AC
  Administered 2021-11-07: 0.5 mg via INTRAVENOUS
  Filled 2021-11-07: qty 1

## 2021-11-07 MED ORDER — MORPHINE SULFATE (PF) 2 MG/ML IV SOLN
2.0000 mg | INTRAVENOUS | Status: DC | PRN
  Administered 2021-11-07 – 2021-11-08 (×3): 2 mg via INTRAVENOUS
  Filled 2021-11-07 (×3): qty 1

## 2021-11-07 MED ORDER — LACOSAMIDE 50 MG PO TABS: 50.0000 mg | ORAL_TABLET | Freq: Two times a day (BID) | ORAL | Status: DC

## 2021-11-07 MED ORDER — ENOXAPARIN SODIUM 40 MG/0.4ML IJ SOSY
40.0000 mg | PREFILLED_SYRINGE | INTRAMUSCULAR | Status: DC
  Administered 2021-11-07 – 2021-11-09 (×3): 40 mg via SUBCUTANEOUS
  Filled 2021-11-07 (×3): qty 0.4

## 2021-11-07 MED ORDER — CHLORHEXIDINE GLUCONATE 0.12% ORAL RINSE (MEDLINE KIT)
15.0000 mL | Freq: Two times a day (BID) | OROMUCOSAL | Status: DC
  Filled 2021-11-07 (×3): qty 15

## 2021-11-07 MED ORDER — LORAZEPAM 2 MG/ML IJ SOLN
INTRAMUSCULAR | Status: AC
  Filled 2021-11-07: qty 1

## 2021-11-07 MED ORDER — FUROSEMIDE 20 MG PO TABS
80.0000 mg | ORAL_TABLET | Freq: Two times a day (BID) | ORAL | Status: DC
  Administered 2021-11-07 – 2021-11-08 (×2): 80 mg via ORAL
  Filled 2021-11-07: qty 4
  Filled 2021-11-07: qty 2
  Filled 2021-11-07: qty 4

## 2021-11-07 MED ORDER — LEVOTHYROXINE SODIUM 100 MCG PO TABS
200.0000 ug | ORAL_TABLET | Freq: Every day | ORAL | Status: DC
  Administered 2021-11-08 – 2021-11-10 (×3): 200 ug via ORAL
  Filled 2021-11-07 (×3): qty 2

## 2021-11-07 MED ORDER — ROPINIROLE HCL 1 MG PO TABS
4.0000 mg | ORAL_TABLET | Freq: Every day | ORAL | Status: DC
  Administered 2021-11-07 – 2021-11-09 (×3): 4 mg via ORAL
  Filled 2021-11-07 (×3): qty 4

## 2021-11-07 MED ORDER — LOSARTAN POTASSIUM 50 MG PO TABS
25.0000 mg | ORAL_TABLET | Freq: Every day | ORAL | Status: DC
  Administered 2021-11-07: 25 mg via ORAL
  Filled 2021-11-07 (×2): qty 1

## 2021-11-07 MED ORDER — SPIRONOLACTONE 25 MG PO TABS
50.0000 mg | ORAL_TABLET | Freq: Every day | ORAL | Status: DC
  Administered 2021-11-07 – 2021-11-08 (×2): 50 mg via ORAL
  Filled 2021-11-07 (×2): qty 2

## 2021-11-07 MED ORDER — IPRATROPIUM-ALBUTEROL 0.5-2.5 (3) MG/3ML IN SOLN: 3.0000 mL | RESPIRATORY_TRACT | Status: DC | PRN

## 2021-11-07 MED ORDER — LORAZEPAM 2 MG/ML IJ SOLN: 0.5000 mg | Freq: Once | INTRAMUSCULAR | Status: AC

## 2021-11-07 NOTE — ED Notes (Signed)
Pt noted to be attempting to climb out of bed. Pt verbally redirected and repositioned in bed only to continue attempts to get up. Dr Damita Dunnings previously at bedside and aware.  ?

## 2021-11-07 NOTE — ED Triage Notes (Signed)
BIB EMS from roadway where pt had seizure while riding in car with family.  Reported seizure lasted approx 5 min.  EMS did witness another seizure.  Pt has hx of same. Pt has had 4 mg versed.  18 G LAC.  BP 119/76.   ?

## 2021-11-07 NOTE — ED Notes (Signed)
Patient restless, reports continued restless legs. Answering questions and following commands appropriately.  ?

## 2021-11-07 NOTE — Assessment & Plan Note (Signed)
No home inhalers on file ?DuoNeb prn ?No respiratory distress at this time ? ?

## 2021-11-07 NOTE — ED Notes (Signed)
Bed alarm placed on patient. Hospital bed requested so patient will not be able to scoot herself down out of stretcher.  ? ?

## 2021-11-07 NOTE — Assessment & Plan Note (Addendum)
DuoNeb prn ?No respiratory distress at this time ?

## 2021-11-07 NOTE — Care Management (Signed)
Cross coverage note: ?Patient admitted earlier for seizure.  Very confused, restless trying to get out of bed.  Ativan ordered ?

## 2021-11-07 NOTE — Assessment & Plan Note (Addendum)
Following w/ oncology ?--resume Fentanyl patch  ?--cont home Percocet PRN ?

## 2021-11-07 NOTE — ED Provider Notes (Signed)
? ?[facility] ?Provider Note ? ? ? Event Date/Time  ? First MD Initiated Contact with Patient 11/07/21 1330   ?  (approximate) ? ? ?History  ? ?Seizures ? ? ?HPI ? ?Level 5 caveat: History of present illness limited due to altered mental status ? ?Brittany Nguyen is a 56 y.o. female with history of seizure disorder on Keppra, diabetes, COPD, CHF status post AICD, and prolonged QT, who presents with 2 witnessed seizures.  Per EMS, the patient was a passenger in a car with family when she had a generalized seizure that lasted several minutes.  She was postictal, then had a second seizure lasting several minutes with EMS.  She received 4 mg of Versed in the field and is now postictal.  The patient nods "yes" when asked if she has been taking her Keppra and states she did not miss any doses. ? ? ? ?Physical Exam  ? ?Triage Vital Signs: ?ED Triage Vitals  ?Enc Vitals Group  ?   BP 11/07/21 1335 102/77  ?   Pulse Rate 11/07/21 1335 94  ?   Resp 11/07/21 1340 10  ?   Temp --   ?   Temp src --   ?   SpO2 11/07/21 1335 95 %  ?   Weight --   ?   Height --   ?   Head Circumference --   ?   Peak Flow --   ?   Pain Score --   ?   Pain Loc --   ?   Pain Edu? --   ?   Excl. in [place]? --   ? ? ?Most recent vital signs: ?Vitals:  ? 11/07/21 1400 11/07/21 1430  ?BP: 96/66 109/68  ?Pulse: 79 71  ?Resp: (!) 24 (!) 22  ?SpO2: 92% 95%  ? ? ? ?General: Somnolent, arousable to loud voice and sternal rub. ?CV:  Good peripheral perfusion.  ?Resp:  Normal effort.  ?Abd:  No distention.  ?Other:  EOMI.  PERRLA.  Motor intact in all extremities. ? ? ?ED Results / Procedures / Treatments  ? ?Labs ?(all labs ordered are listed, but only abnormal results are displayed) ?Labs Reviewed  ?BASIC METABOLIC PANEL - Abnormal; Notable for the following components:  ?    Result Value  ? Glucose, Bld 140 (*)   ? All other components within normal limits  ?CBC WITH DIFFERENTIAL/PLATELET  ?MAGNESIUM  ?LEVETIRACETAM LEVEL   ? ? ? ?EKG ? ?ED ECG REPORT ?IArta Silence, the attending physician, personally viewed and interpreted this ECG. ? ?Date: 11/07/2021 ?EKG Time: 1347 ?Rate: 87 ?Rhythm: normal sinus rhythm ?QRS Axis: normal ?Intervals: Borderline prolonged QTc ?ST/T Wave abnormalities: Nonspecific ST abnormalities ?Narrative Interpretation: no evidence of acute ischemia; no significant change when compared to EKG of 09/27/2021 ? ? ? ?RADIOLOGY ? ? ? ?PROCEDURES: ? ?Critical Care performed: No ? ?Procedures ? ? ?MEDICATIONS ORDERED IN ED: ?Medications - No data to display ? ? ?IMPRESSION / MDM / ASSESSMENT AND PLAN / ED COURSE  ?I reviewed the triage vital signs and the nursing notes. ? ?56 year old female with PMH as noted above including a known seizure disorder presents with 2 seizures witnessed by family, then EMS. ? ?On exam the patient is somnolent after receiving 4 mg of Versed but arousable.  She is able to answer questions with nods and single word answers and follow commands.  Neurologic exam is nonfocal.  The patient states she is compliant with  her Keppra. ? ?The etiology of these breakthrough seizures is unclear but may include medication noncompliance, poor sleep or other change in routine, or possible electrolyte abnormality or other metabolic cause.  We will obtain lab work-up and reassess.  There is no indication for brain imaging at this time. ? ?I initially consider loading with IV Keppra, however the patient has a history of prolonged QT and her QTc is currently 500 on her EKG.  Therefore, unless she has another seizure, I will wait until she is more awake and we can verify whether or not she has been taking her medications (or we get the serum levetiracetam level) ? ?The patient is on the cardiac monitor to evaluate for evidence of arrhythmia and/or significant heart rate changes. ? ?----------------------------------------- ?3:07 PM on 11/07/2021 ?----------------------------------------- ? ?The patient  remains sleepy but arousable.  She has not had any additional seizures.  Her cousin states that she has not not been compliant with her BiPAP the last several nights and has been sleeping poorly.  This may be the trigger for her seizures today.  Since her QT is elevated we will continue to hold off on Keppra for now unless she seizes again.  I signed her out to the oncoming ED physician Dr. Ellender Hose. ? ? ? ?FINAL CLINICAL IMPRESSION(S) / ED DIAGNOSES  ? ?Final diagnoses:  ?Seizure ([place])  ? ? ? ?Rx / DC Orders  ? ?ED Discharge Orders   ? ? None  ? ?  ? ? ? ?Note:  This document was prepared using Dragon voice recognition software and may include unintentional dictation errors.  ?  Arta Silence, MD ?11/07/21 1507 ? ?

## 2021-11-07 NOTE — ED Notes (Signed)
Patient drowsy, will not keep eyes open. Able to answer questions regarding LOC correctly X 4. Requested medication for restless legs or pain medication. Dr. Damita Dunnings messaged via secure chat, medication ordered, awaiting verification from pharmacy. Patient instructed to remain in bed and use call light for needs and to not get out of bed without assistance. Patient verbalized understanding. Patient then scooted herself to the end of the bed and began pulling cardiac monitor off. Patient would not answer questions. Patient then noted to shake head from left to right continuously while sitting upright in bed with legs dangling from bed. Patient then noted to move legs back and forth while lying in bed. Patient not answering questions or following commands. Patient was informed that RN was going to lie her back in bed. Patient turned head to look behind her while RN was assisting her to supine position. RN left the room to get draw sheet to boost patient in bed. Upon return patient was sitting upright at edge of stretcher again moving legs. Patient reported her legs hurt. Bed alarm not available in room. Patient continues to attempt to get out of bed.  ? ?

## 2021-11-07 NOTE — ED Notes (Signed)
Patient placed on hospital bed with bed alarm on. ? ?

## 2021-11-07 NOTE — ED Provider Notes (Signed)
Assumed care from Dr. Cherylann Banas at 3 PM. Briefly, the patient is a 56 y.o. 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 here with seizures, AMS. Pt has prior h/o seizures, has reportedly not been sleeping well recently. Husband believes she has been taking her Keppra. No known recent trauma. 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 Reportedly have seizure x 2 with EMS prior to arrival. Here, I was called into room for pt "shaking," but she was able to respond during these episodes- unclear whether this was just post-ictal behavior, but did not appear to represent generalized seizure behavior. No known h/o PNES per my review of records, however. Labs reassuring. Will discuss with Neurology..  ? ?Labs Reviewed  ?BASIC METABOLIC PANEL - Abnormal; Notable for the following components:  ?    Result Value  ? Glucose, Bld 140 (*)   ? All other components within normal limits  ?CBC WITH DIFFERENTIAL/PLATELET  ?MAGNESIUM  ?LEVETIRACETAM LEVEL  ?URINALYSIS, COMPLETE (UACMP) WITH MICROSCOPIC  ? ? ?Course of Care: ?Case discussed with neurology Dr. Quinn Axe.  Will load with Vimpat as this does not prolong QT, admit for EEG and further work-up.  Patient family updated and in agreement.  CT head obtained, reviewed by me, and shows no acute intracranial abnormality. ? ? ?  ?Duffy Bruce, MD ?11/07/21 1636 ? ?

## 2021-11-07 NOTE — Assessment & Plan Note (Addendum)
--  cont home ropinirole ?

## 2021-11-07 NOTE — Assessment & Plan Note (Signed)
Hold potential QT prolonging medications ?

## 2021-11-07 NOTE — Assessment & Plan Note (Addendum)
ED physician consulted w/ neurology  ?--switch from home keppra to Vimpat ?Plan: ?--cont Vimpat 100 mg BID ?--IV ativan PRN for seizure ?

## 2021-11-07 NOTE — H&P (Signed)
HISTORY AND PHYSICAL  Patient: Brittany Nguyen 56 y.o. female MRN: [id number]  Today is hospital day 0 after admission on 11/07/2021  1:27 PM  RECORD REVIEW AND HOSPITAL COURSE: Mayari Matus is a 56 y.o. female with history of seizure disorder on Keppra, diabetes, COPD, CHF status post AICD, and prolonged QT, who presents with 2 witnessed seizures.  Per EMS, the patient was a passenger in a car with family when she had a generalized seizure that lasted several minutes.  She was postictal, then had a second seizure lasting several minutes with EMS.  She received 4 mg of Versed in the field and is now postictal.  The patient nods "yes" when asked if she has been taking her Keppra and states she did not miss any doses. In ED, CT head no concerns, labd WNL but Keppra level pending, EKG no concerns, borderline prolonged QT,   Procedures and Significant Results:  CT head 11/07/21: no acute process EEG ordered 11/07/21: pending   Consultants:  Neurology     SUBJECTIVE:  Patient seen and examined resting comfortable in ED, NAD. She is rousable to voice/sternal rub but not very verbally communicative, can say "yes/no" husband answers for her but confirms above history. He states she's had significant anxiety d/t stomach cancer diagnosis and treatment, she has not been sleeping. He believes she has been adherent to home antiseizure medications. Nothing else to add from his perspective.      ASSESSMENT & PLAN  Seizure [facility]) ED physician consulted w/ neurology - started on vimpat, keppra level pending, EEG pending Held home benzodiazepine, gabapentin, ropinirole d/t potential effect on EEG reading - can restart per neuro  Neuroendocrine neoplasm of stomach Following w/ oncology  At risk for prolonged QT interval syndrome - borderline long QT on EKG 11/07/21 in ED Hold potential QT prolonging medications  Asthma DuoNeb prn No respiratory distress at this time  COPD (chronic  obstructive pulmonary disease) (HCC) No home inhalers on file DuoNeb prn No respiratory distress at this time   RLS (restless legs syndrome) Held home benzodiazepine, gabapentin, ropinirole d/t potential effect on EEG reading - can restart per neuro    VTE Ppx: lovenox CODE STATUS   Code Status: Full Code Admitted from: home Expected Dispo: home Barriers to discharge: neurology recs, need to ensure seizure control, awaiting EEG Family communication: husband at bedside in ED              Past Medical History:  Diagnosis Date   Asthma 2013   Atrial fibrillation ([place])    C. 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    CHF (congestive heart failure) ([place])    COPD (chronic obstructive pulmonary disease) ([place])    Diverticulitis 2010   DVT (deep venous thrombosis) ([place])    Endometriosis 1990   GIB (gastrointestinal bleeding) 01/08/2018   Heart disease 2013   Hx MRSA infection    Iron deficiency    Iron deficiency anemia 03/27/2018   Lump or mass in breast    Restless leg    Seizures ([place])    Stomach cancer ([place])     Past Surgical History:  Procedure Laterality Date   ABDOMINAL HYSTERECTOMY     age 72   BREAST BIOPSY Right 2014   benign   CARDIAC DEFIBRILLATOR PLACEMENT     CARDIAC DEFIBRILLATOR PLACEMENT     CESAREAN SECTION     CHOLECYSTECTOMY     COLECTOMY  COLONOSCOPY WITH PROPOFOL N/A 02/22/2018   Procedure: COLONOSCOPY WITH PROPOFOL;  Surgeon: Toledo, Benay Pike, MD;  Location: [facility] ENDOSCOPY;  Service: Gastroenterology;  Laterality: N/A;   ESOPHAGOGASTRODUODENOSCOPY (EGD) WITH PROPOFOL N/A 02/22/2018   Procedure: ESOPHAGOGASTRODUODENOSCOPY (EGD) WITH PROPOFOL;  Surgeon: Toledo, Benay Pike, MD;  Location: [facility] ENDOSCOPY;  Service: Gastroenterology;  Laterality: N/A;   NASAL SINUS SURGERY  2012   OOPHORECTOMY      Family History  Problem Relation Age of Onset   Cancer Mother 57       ovarian   CAD Mother    Cancer  Father 19       brain   CAD Father    Cancer Daughter 47       skin   Cancer Maternal Aunt 58       breast   Leukemia Paternal Grandfather    Social History:  reports that she has never smoked. She has never used smokeless tobacco. She reports current drug use. She reports that she does not drink alcohol.  Allergies:  Allergies  Allergen Reactions   Contrast Media [Iodinated Contrast Media] Shortness Of Breath    Per CT report in 2014 pt had break through contrast reaction with hives and SOB following 13 hour prep. MSY    Lidocaine Hives   Metrizamide Shortness Of Breath   Penicillins Hives and Other (See Comments)    Other reaction(s): Other (See Comments) Has patient had a PCN reaction causing immediate rash, facial/tongue/throat swelling, SOB or lightheadedness with hypotension: Yes Has patient had a PCN reaction causing severe rash involving mucus membranes or skin necrosis: No Has patient had a PCN reaction that required hospitalization No Has patient had a PCN reaction occurring within the last 10 years: No If all of the above answers are "NO", then may proceed with Cephalosporin use. Has patient had a PCN reaction causing immediate rash, facial/tongue/throat swelling, SOB or lightheadedness with hypotension: Yes Has patient had a PCN reaction causing severe rash involving mucus membranes or skin necrosis: No Has patient had a PCN reaction that required hospitalization No Has patient had a PCN reaction occurring within the last 10 years: No If all of the above answers are "NO", then may proceed with Cephalosporin use.  ediate rash, facial/tongue/throat swelling, SOB or lightheadedness with hypotension, tachy   Sacubitril-Valsartan     Other reaction(s): Other (See Comments) Sz (unclear if new start entresto vs hypoglycemia)   Isosorbide Nitrate     Other reaction(s): Headache   Latex Hives   Ondansetron Other (See Comments)    Severe headache   Zofran [Ondansetron Hcl]  Other (See Comments)    Causes migraines   Povidone-Iodine Rash    Blistering rash   Pulmicort [Budesonide] Itching   No current facility-administered medications on file prior to encounter.   Current Outpatient Medications on File Prior to Encounter  Medication Sig Dispense Refill   fentaNYL (DURAGESIC) 50 MCG/HR Place 1 patch onto the skin every 3 (three) days. 5 patch 0   furosemide (LASIX) 40 MG tablet Take 80 mg by mouth 2 (two) times daily.      gabapentin (NEURONTIN) 100 MG capsule Take 100 mg by mouth at bedtime.     levETIRAcetam (KEPPRA) 500 MG tablet Take 500 mg by mouth 2 (two) times daily.     levothyroxine (SYNTHROID) 200 MCG tablet Take 200 mcg by mouth daily.     LORazepam (ATIVAN) 1 MG tablet Take 1 mg by mouth 4 (four) times daily as needed.  losartan (COZAAR) 25 MG tablet Take 25 mg by mouth daily.      metoprolol succinate (TOPROL-XL) 50 MG 24 hr tablet Take 50 mg by mouth 2 (two) times daily.     morphine (MSIR) 15 MG tablet Take 15 mg by mouth as needed for severe pain.      oxyCODONE-acetaminophen (PERCOCET) 5-325 MG tablet Take 1 tablet by mouth every 4 (four) hours as needed. 12 tablet 0   oxyCODONE-acetaminophen (PERCOCET/ROXICET) 5-325 MG tablet Take 1-2 tablets by mouth every 4 (four) hours as needed.     potassium chloride SA (KLOR-CON M20) 20 MEQ tablet Take 1 tablet (20 mEq total) by mouth 2 (two) times daily for 7 days. 14 tablet 0   promethazine (PHENERGAN) 25 MG tablet Take 1 tablet (25 mg total) by mouth every 6 (six) hours as needed. 30 tablet 0   rOPINIRole (REQUIP) 4 MG tablet Take 4 mg by mouth at bedtime.     spironolactone (ALDACTONE) 50 MG tablet Take 50 mg by mouth daily.      Results for orders placed or performed during the hospital encounter of 11/07/21 (from the past 48 hour(s))  Basic metabolic panel     Status: Abnormal   Collection Time: 11/07/21  1:31 PM  Result Value Ref Range   Sodium 141 135 - 145 mmol/L   Potassium 3.7 3.5 -  5.1 mmol/L   Chloride 106 98 - 111 mmol/L   CO2 25 22 - 32 mmol/L   Glucose, Bld 140 (H) 70 - 99 mg/dL    Comment: Glucose reference range applies only to samples taken after fasting for at least 8 hours.   BUN 13 6 - 20 mg/dL   Creatinine, Ser 0.93 0.44 - 1.00 mg/dL   Calcium 9.2 8.9 - 10.3 mg/dL   GFR, Estimated >60 >60 mL/min    Comment: (NOTE) Calculated using the CKD-EPI Creatinine Equation (2021)    Anion gap 10 5 - 15    Comment: Performed at [facility], [address] [place], [place] [address]  CBC with Differential     Status: None   Collection Time: 11/07/21  1:31 PM  Result Value Ref Range   WBC 6.8 4.0 - 10.5 K/uL   RBC 4.66 3.87 - 5.11 MIL/uL   Hemoglobin 13.6 12.0 - 15.0 g/dL   HCT 40.8 36.0 - 46.0 %   MCV 87.6 80.0 - 100.0 fL   MCH 29.2 26.0 - 34.0 pg   MCHC 33.3 30.0 - 36.0 g/dL   RDW 13.2 11.5 - 15.5 %   Platelets 217 150 - 400 K/uL   nRBC 0.0 0.0 - 0.2 %   Neutrophils Relative % 80 %   Neutro Abs 5.4 1.7 - 7.7 K/uL   Lymphocytes Relative 15 %   Lymphs Abs 1.0 0.7 - 4.0 K/uL   Monocytes Relative 4 %   Monocytes Absolute 0.3 0.1 - 1.0 K/uL   Eosinophils Relative 1 %   Eosinophils Absolute 0.0 0.0 - 0.5 K/uL   Basophils Relative 0 %   Basophils Absolute 0.0 0.0 - 0.1 K/uL   Immature Granulocytes 0 %   Abs Immature Granulocytes 0.02 0.00 - 0.07 K/uL    Comment: Performed at [facility], [address] [place], [place] [address]  Magnesium     Status: None   Collection Time: 11/07/21  1:31 PM  Result Value Ref Range   Magnesium 2.4 1.7 - 2.4 mg/dL    Comment: Performed at [facility], [address] [place], [place] [address]   CT HEAD WO CONTRAST (5MM)  Result Date: 11/07/2021 CLINICAL DATA:  Seizure while riding in the car with family. EXAM: CT HEAD WITHOUT CONTRAST TECHNIQUE: Contiguous axial images were obtained from the base of the skull through the vertex without intravenous contrast. RADIATION DOSE REDUCTION:  This exam was performed according to the departmental dose-optimization program which includes automated exposure control, adjustment of the mA and/or kV according to patient size and/or use of iterative reconstruction technique. COMPARISON:  06/26/2021 FINDINGS: Brain: No evidence of acute infarction, hemorrhage, hydrocephalus, extra-axial collection or mass lesion/mass effect. Vascular: No hyperdense vessel or unexpected calcification. Skull: Normal. Negative for fracture or focal lesion. Sinuses/Orbits: Globes and orbits are unremarkable. Previous sinus surgery. Mild right frontal and minor ethmoid sinus mucosal thickening. Clear mastoid air cells. Other: None. IMPRESSION: 1. No intracranial abnormality. Electronically Signed   By: Lajean Manes M.D.   On: 11/07/2021 16:20    Review of Systems  Reason unable to perform ROS: mental status of patient - husband provides info as follows:  Constitutional:  Negative for activity change.  Respiratory:  Negative for apnea and shortness of breath.   Neurological:  Positive for seizures. Negative for headaches.   Blood pressure 103/66, pulse 77, temperature 97.8 F (36.6 C), temperature source Oral, resp. rate 16, last menstrual period 01/22/2012, SpO2 97 %. Physical Exam Constitutional:      General: She is not in acute distress.    Appearance: Normal appearance. She is obese. She is not ill-appearing.  Cardiovascular:     Rate and Rhythm: Normal rate and regular rhythm.  Pulmonary:     Effort: Pulmonary effort is normal.     Breath sounds: Normal breath sounds.  Abdominal:     General: Abdomen is flat. Bowel sounds are normal.     Palpations: Abdomen is soft.  Musculoskeletal:        General: No swelling.     Cervical back: Neck supple.     Right lower leg: No edema.     Left lower leg: No edema.  Skin:    General: Skin is warm and dry.  Neurological:     General: No focal deficit present.      Emeterio Reeve, DO 11/07/2021, 6:06  PM

## 2021-11-08 DIAGNOSIS — R400 Somnolence: Secondary | ICD-10-CM

## 2021-11-08 DIAGNOSIS — G40919 Epilepsy, unspecified, intractable, without status epilepticus: Secondary | ICD-10-CM

## 2021-11-08 LAB — LEVETIRACETAM LEVEL: Levetiracetam Lvl: <2 ug/mL — ABNORMAL LOW (ref 10.0–40.0)

## 2021-11-08 LAB — URINALYSIS, COMPLETE (UACMP) WITH MICROSCOPIC
Bilirubin Urine: NEGATIVE
Glucose, UA: NEGATIVE mg/dL
Ketones, ur: NEGATIVE mg/dL
Nitrite: NEGATIVE
Protein, ur: NEGATIVE mg/dL
Specific Gravity, Urine: 1.006 (ref 1.005–1.030)
pH: 5 (ref 5.0–8.0)

## 2021-11-08 LAB — MRSA NEXT GEN BY PCR, NASAL: MRSA by PCR Next Gen: NOT DETECTED

## 2021-11-08 LAB — GLUCOSE, CAPILLARY: Glucose-Capillary: 104 mg/dL — ABNORMAL HIGH (ref 70–99)

## 2021-11-08 LAB — TROPONIN I (HIGH SENSITIVITY): Troponin I (High Sensitivity): <2 ng/L (ref <18)

## 2021-11-08 MED ORDER — OXYCODONE-ACETAMINOPHEN 5-325 MG PO TABS
1.0000 | ORAL_TABLET | ORAL | Status: DC | PRN
  Administered 2021-11-09 (×2): 1 via ORAL
  Filled 2021-11-08 (×2): qty 1

## 2021-11-08 MED ORDER — CHLORHEXIDINE GLUCONATE CLOTH 2 % EX PADS
6.0000 | MEDICATED_PAD | Freq: Every day | CUTANEOUS | Status: DC
  Administered 2021-11-08: 6 via TOPICAL

## 2021-11-08 MED ORDER — PROCHLORPERAZINE EDISYLATE 10 MG/2ML IJ SOLN
5.0000 mg | Freq: Four times a day (QID) | INTRAMUSCULAR | Status: DC | PRN
  Administered 2021-11-08: 5 mg via INTRAVENOUS
  Filled 2021-11-08 (×3): qty 1

## 2021-11-08 MED ORDER — FENTANYL 50 MCG/HR TD PT72
1.0000 | MEDICATED_PATCH | TRANSDERMAL | Status: DC
  Filled 2021-11-08: qty 1

## 2021-11-08 MED ORDER — LACOSAMIDE 50 MG PO TABS
100.0000 mg | ORAL_TABLET | Freq: Two times a day (BID) | ORAL | Status: DC
  Administered 2021-11-08 – 2021-11-10 (×5): 100 mg via ORAL
  Filled 2021-11-08 (×5): qty 2

## 2021-11-08 MED ORDER — GABAPENTIN 100 MG PO CAPS
100.0000 mg | ORAL_CAPSULE | Freq: Every day | ORAL | Status: DC
  Administered 2021-11-08 – 2021-11-09 (×2): 100 mg via ORAL
  Filled 2021-11-08 (×2): qty 1

## 2021-11-08 MED ORDER — LORAZEPAM 1 MG PO TABS
1.0000 mg | ORAL_TABLET | Freq: Four times a day (QID) | ORAL | Status: DC | PRN
  Administered 2021-11-09 – 2021-11-10 (×2): 1 mg via ORAL
  Filled 2021-11-08 (×2): qty 1

## 2021-11-08 MED ORDER — LORAZEPAM 2 MG/ML IJ SOLN
2.0000 mg | Freq: Every day | INTRAMUSCULAR | Status: DC | PRN
  Administered 2021-11-08: 2 mg via INTRAVENOUS
  Filled 2021-11-08: qty 1

## 2021-11-08 MED ORDER — ONDANSETRON HCL 4 MG/2ML IJ SOLN: 4.0000 mg | Freq: Four times a day (QID) | INTRAMUSCULAR | Status: DC | PRN

## 2021-11-08 MED ORDER — NITROGLYCERIN 0.4 MG SL SUBL
0.4000 mg | SUBLINGUAL_TABLET | SUBLINGUAL | Status: DC | PRN
  Administered 2021-11-08: 0.4 mg via SUBLINGUAL
  Filled 2021-11-08: qty 1

## 2021-11-08 MED ORDER — ONDANSETRON 4 MG PO TBDP
4.0000 mg | ORAL_TABLET | Freq: Three times a day (TID) | ORAL | Status: DC | PRN
Start: 2021-11-08 — End: 2021-11-08

## 2021-11-08 NOTE — Progress Notes (Signed)
?  Progress Note ? ? ?Patient: Brittany Nguyen MVH:[id number] DOB: 08-Jul-1966 DOA: 11/07/2021     1 ?DOS: the patient was seen and examined on 11/08/2021 ?  ?Brief hospital course: ?Lavina Resor Coakley is a 56 y.o. female with history of seizure disorder on Keppra, diabetes, COPD, CHF status post AICD, and prolonged QT, who presents with 2 witnessed seizures.  Per EMS, the patient was a passenger in a car with family when she had a generalized seizure that lasted several minutes.  She was postictal, then had a second seizure lasting several minutes with EMS.  ? ?Assessment and Plan: ?At risk for prolonged QT interval syndrome - borderline long QT on EKG 11/07/21 in ED ?Hold potential QT prolonging medications ? ?Asthma ?DuoNeb prn ?No respiratory distress at this time ? ?Neuroendocrine neoplasm of stomach ?Following w/ oncology ?--home Fentanyl patch held due to somnolence, will resume tomorrow if pt stays alert. ?--cont home Percocet PRN for now ? ?Breakthrough seizure ([place]) ?ED physician consulted w/ neurology - started on vimpat, keppra level pending, EEG pending ?Plan: ?--neuro consult ?--IV ativan PRN for seizure ? ?COPD (chronic obstructive pulmonary disease) ([place]) ?No home inhalers on file ?DuoNeb prn ?No respiratory distress at this time ? ? ?Hypotension ?--BP intermittently low today ?Plan: ?--hold home lasix, aldactone and losartan for now ? ?RLS (restless legs syndrome) ?Held home benzodiazepine, gabapentin, ropinirole d/t potential effect on EEG reading ?--resume home ativan QID PRN, per neuro ? ? ? ? ?  ? ?Subjective:  ?Pt was very sleepy most of the day, but woke up this morning a couple of times complaining of severe chest pain.  Neg trop and EKG. ? ?RN also noted about 3 episodes of seizures, short-lived, self-resolved.   ? ?Pt became more alert later in the afternoon and started eating.  Denied abdominal pain. ? ? ?Physical Exam: ?Vitals:  ? 11/08/21 1600 11/08/21 1602 11/08/21 1700 11/08/21 1800  ?BP:  (!) 77/46 97/62 103/65 99/73  ?Pulse: 65 63 74 (!) 106  ?Resp: (!) 23 19 (!) 8 (!) 24  ?Temp:      ?TempSrc:      ?SpO2: 93% 95% 96% 100%  ?Weight:      ?Height:      ? ? ?Constitutional: NAD, AAOx3 ?HEENT: conjunctivae and lids normal, EOMI ?CV: No cyanosis.   ?RESP: normal respiratory effort, on RA ?Extremities: No effusions, edema in BLE ?SKIN: warm, dry ?Neuro: II - XII grossly intact.   ? ? ?Data Reviewed: ? ?Family Communication: husband updated at bedside today ? ?Disposition: ?Status is: Inpatient ?Remains inpatient appropriate because: still having seizures ? ? Planned Discharge Destination: Home ? ? ? ?Time spent: 50 minutes ? ?Author: ?Enzo Bi, MD ?11/08/2021 7:29 PM ? ?For on call review [email or web address].  ?

## 2021-11-08 NOTE — Care Management (Signed)
Floor coverage ?Pt c/o neuropathic pain in legs. Resume home gabapentin. ?

## 2021-11-08 NOTE — Consult Note (Signed)
NEURO HOSPITALIST CONSULT NOTE   Requestig physician: Dr. Billie Ruddy  Reason for Consult: Breakthrough seizure   History obtained from:   Patient and Chart     HPI:                                                                                                                                          Brittany Nguyen is an 56 y.o. female with a PMHx of seizure disorder (on Keppra), COPD, DM, prolonged QT, atrial fibrillation, DVT, GIB, carcinoid tumor, CHF, status post AICD, who presented on Sunday afternoon with 2 witnessed seizures.  Per EMS, the patient was a passenger in a car with family when she had a generalized seizure that lasted for approximately 5 minutes. She was postictal after the initial seizure, then had a second seizure with EMS that lasted for several minutes.  She received 4 mg of Versed in the field and was postictal on arrival to the ED. BP on arrival to the ED was 119/76The patient has been compliant with her Keppra and has not missed any doses. Her cousin stated that she had not not been compliant with her BiPAP the last several nights and had been sleeping poorly; it was thought that sleep deprivation may have been the trigger for her breakthrough seizures. Family stated that she has had significant anxiety due to her stomach cancer diagnosis and treatment. Na, Ca, and Mg were normal. Glucose was mildly elevated at 140. Influenza and Covid PCR tests were negative. BUN and Cr normal. CT head revealed no acute intracranial abnormality.   EDP initially considered loading with IV Keppra, however given that the patient has a history of prolonged QT and her QTc was 500 on her EKG in the ED, it was that it would not be safe to load the patient with Keppra.   Neurology was called and it was recommended to load the patient with Vimpat 200 mg IV, followed by scheduled dosing of 50 mg po BID. An EEG was recommended.   This morning, she had a 3 minute seizure from  [phone], and then a 30 second seizure following that one. She was awake and following commands several minutes afterwards.   Past Medical History:  Diagnosis Date   Asthma 2013   Atrial fibrillation ([place])    C. 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    CHF (congestive heart failure) ([place])    COPD (chronic obstructive pulmonary disease) ([place])    Diverticulitis 2010   DVT (deep venous thrombosis) ([place])    Endometriosis 1990   GIB (gastrointestinal bleeding) 01/08/2018   Heart disease 2013   Hx MRSA infection    Iron deficiency    Iron deficiency anemia 03/27/2018   Lump or mass  in breast    Restless leg    Seizures ([facility])    Stomach cancer ([facility])     Past Surgical History:  Procedure Laterality Date   ABDOMINAL HYSTERECTOMY     age 33   BREAST BIOPSY Right 2014   benign   CARDIAC DEFIBRILLATOR PLACEMENT     CARDIAC DEFIBRILLATOR PLACEMENT     CESAREAN SECTION     CHOLECYSTECTOMY     COLECTOMY     COLONOSCOPY WITH PROPOFOL N/A 02/22/2018   Procedure: COLONOSCOPY WITH PROPOFOL;  Surgeon: Toledo, Benay Pike, MD;  Location: [facility] ENDOSCOPY;  Service: Gastroenterology;  Laterality: N/A;   ESOPHAGOGASTRODUODENOSCOPY (EGD) WITH PROPOFOL N/A 02/22/2018   Procedure: ESOPHAGOGASTRODUODENOSCOPY (EGD) WITH PROPOFOL;  Surgeon: Toledo, Benay Pike, MD;  Location: [facility] ENDOSCOPY;  Service: Gastroenterology;  Laterality: N/A;   NASAL SINUS SURGERY  2012   OOPHORECTOMY      Family History  Problem Relation Age of Onset   Cancer Mother 13       ovarian   CAD Mother    Cancer Father 62       brain   CAD Father    Cancer Daughter 2       skin   Cancer Maternal Aunt 54       breast   Leukemia Paternal Grandfather               Social History:  reports that she has never smoked. She has never used smokeless tobacco. She reports current drug use. She reports that she does not drink alcohol.  Allergies  Allergen Reactions   Contrast Media [Iodinated  Contrast Media] Shortness Of Breath    Per CT report in 2014 pt had break through contrast reaction with hives and SOB following 13 hour prep. MSY    Lidocaine Hives   Metrizamide Shortness Of Breath   Penicillins Hives and Other (See Comments)    Other reaction(s): Other (See Comments) Has patient had a PCN reaction causing immediate rash, facial/tongue/throat swelling, SOB or lightheadedness with hypotension: Yes Has patient had a PCN reaction causing severe rash involving mucus membranes or skin necrosis: No Has patient had a PCN reaction that required hospitalization No Has patient had a PCN reaction occurring within the last 10 years: No If all of the above answers are "NO", then may proceed with Cephalosporin use. Has patient had a PCN reaction causing immediate rash, facial/tongue/throat swelling, SOB or lightheadedness with hypotension: Yes Has patient had a PCN reaction causing severe rash involving mucus membranes or skin necrosis: No Has patient had a PCN reaction that required hospitalization No Has patient had a PCN reaction occurring within the last 10 years: No If all of the above answers are "NO", then may proceed with Cephalosporin use.  ediate rash, facial/tongue/throat swelling, SOB or lightheadedness with hypotension, tachy   Sacubitril-Valsartan     Other reaction(s): Other (See Comments) Sz (unclear if new start entresto vs hypoglycemia)   Isosorbide Nitrate     Other reaction(s): Headache   Latex Hives   Ondansetron Other (See Comments)    Severe headache   Zofran [Ondansetron Hcl] Other (See Comments)    Causes migraines   Povidone-Iodine Rash    Blistering rash   Pulmicort [Budesonide] Itching    MEDICATIONS:  Scheduled:  chlorhexidine gluconate (MEDLINE KIT)  15 mL Mouth Rinse BID   Chlorhexidine Gluconate Cloth  6 each Topical Daily    enoxaparin (LOVENOX) injection  40 mg Subcutaneous Q24H   lacosamide  100 mg Oral BID   levothyroxine  200 mcg Oral Q0600   mouth rinse  15 mL Mouth Rinse 10 times per day   metoprolol succinate  50 mg Oral BID   rOPINIRole  4 mg Oral QHS   Continuous:   ROS:                                                                                                                                       The patient is unable to provide a comprehensive ROS due to postictal drowsiness.    Blood pressure 90/61, pulse 73, temperature 97.8 F (36.6 C), temperature source Oral, resp. rate 14, height $RemoveBe'5\' 5"'oVnwnqmQo$  (1.651 m), weight 84.9 kg, last menstrual period 01/22/2012, SpO2 97 %.   General Examination:                                                                                                       Physical Exam  HEENT-  [place]/AT    Lungs- Respirations unlabored Extremities- No edema  Neurological Examination Mental Status: Drowsy to somnolent. When aroused she is able to correctly state the year, month, city and state, but not the correct day. Speech is soft and sparse but fluent with intact naming and comprehension. She does doze off in the middle of some questions, but is easily arousable.  Cranial Nerves: II: Tracks and fixates normally. PERRL.   III,IV, VI: No ptosis. EOMI.  V: Temp sensation intact bilaterally VII: Smile symmetric VIII: Hearing intact to voice IX,X: No hypophonia or hoarseness XI: Symmetric XII: Midline tongue extension Motor: Right : Upper extremity   5/5    Left:     Upper extremity   5/5  Lower extremity   5/5     Lower extremity   5/5 Sensory: Temp and light touch intact throughout, bilaterally Deep Tendon Reflexes: 2+ and symmetric bilateral brachioradialis. Unable to elicit patellars due to patient positioning.  Cerebellar: No ataxia with FNF bilaterally  Gait: Unable to assess   Lab Results: Basic Metabolic Panel: Recent Labs  Lab 11/07/21 1331  NA 141  K 3.7   CL 106  CO2 25  GLUCOSE 140*  BUN 13  CREATININE 0.93  CALCIUM 9.2  MG 2.4    CBC: Recent Labs  Lab 11/07/21 1331  WBC 6.8  NEUTROABS 5.4  HGB 13.6  HCT 40.8  MCV 87.6  PLT 217    Cardiac Enzymes: No results for input(s): CKTOTAL, CKMB, CKMBINDEX, TROPONINI in the last 168 hours.  Lipid Panel: No results for input(s): CHOL, TRIG, HDL, CHOLHDL, VLDL, LDLCALC in the last 168 hours.  Imaging: CT HEAD WO CONTRAST (5MM)  Result Date: 11/07/2021 CLINICAL DATA:  Seizure while riding in the car with family. EXAM: CT HEAD WITHOUT CONTRAST TECHNIQUE: Contiguous axial images were obtained from the base of the skull through the vertex without intravenous contrast. RADIATION DOSE REDUCTION: This exam was performed according to the departmental dose-optimization program which includes automated exposure control, adjustment of the mA and/or kV according to patient size and/or use of iterative reconstruction technique. COMPARISON:  06/26/2021 FINDINGS: Brain: No evidence of acute infarction, hemorrhage, hydrocephalus, extra-axial collection or mass lesion/mass effect. Vascular: No hyperdense vessel or unexpected calcification. Skull: Normal. Negative for fracture or focal lesion. Sinuses/Orbits: Globes and orbits are unremarkable. Previous sinus surgery. Mild right frontal and minor ethmoid sinus mucosal thickening. Clear mastoid air cells. Other: None. IMPRESSION: 1. No intracranial abnormality. Electronically Signed   By: Lajean Manes M.D.   On: 11/07/2021 16:20     Assessment: 56 y.o. female with a PMHx of seizure disorder (on Keppra), COPD, DM, prolonged QT, atrial fibrillation, DVT, GIB, carcinoid tumor, CHF, status post AICD, who presented on Sunday afternoon with 2 witnessed seizures.  1. Keppra was switched to Vimpat due to prolonged QT interval. 2. Exam reveals drowsiness/somnolence consistent with postictal state in conjunction with sedation from recent benzodiazepine administration.   3. EEG: This study is within normal limits. No seizures or epileptiform discharges were seen throughout the recording.  Recommendations: 1. Can restart her low-dose home Neurontin as well as her PRN home Ativan.  2. Due to breakthrough seizures this AM, increasing scheduled dose of Vimpat to 100 mg po BID (ordered).  3. Inpatient seizure precautions.  4. Outpatient seizure precautions: Per [facility] statutes, patients with seizures are not allowed to drive until  they have been seizure-free for six months. Use caution when using heavy equipment or power tools. Avoid working on ladders or at heights. Take showers instead of baths. Ensure the water temperature is not too high on the home water heater. Do not go swimming alone. When caring for infants or small children, sit down when holding, feeding, or changing them to minimize risk of injury to the child in the event you have a seizure. Also, Maintain good sleep hygiene. Avoid alcohol. 5. Neurohospitalist service will follow PRN. Call Neurology if she remains postictal.  6. Will need outpatient Neurology follow up.    Electronically signed: Dr. Kerney Elbe 11/08/2021, 8:44 AM

## 2021-11-08 NOTE — Progress Notes (Signed)
Pt. c/o neuropathy to bilateral lower extremities. Dr. Harvest Forest contacted. New order for Gabapentin 100 mg PO at bedtime started. ?

## 2021-11-08 NOTE — Procedures (Signed)
Patient Name: Brittany Nguyen  ?MRN: [id number]  ?Epilepsy Attending: Lora Havens  ?Referring Physician/Provider: Duffy Bruce, MD ?Date: 11/08/2021  ?Duration: 23.51 mins ? ?Patient history: 56 year old female with history of epilepsy on Keppra who presented with breakthrough seizure.  EEG done for seizure. ? ?Level of alertness: Awake, asleep ? ?AEDs during EEG study: LCM ? ?Technical aspects: This EEG study was done with scalp electrodes positioned according to the 10-20 International system of electrode placement. Electrical activity was acquired at a sampling rate of '500Hz'$  and reviewed with a high frequency filter of '70Hz'$  and a low frequency filter of '1Hz'$ . EEG data were recorded continuously and digitally stored.  ? ?Description: The posterior dominant rhythm consists of 8.5 Hz activity of moderate voltage (25-35 uV) seen predominantly in posterior head regions, symmetric and reactive to eye opening and eye closing. Sleep was characterized by vertex waves, sleep spindles (12 to 14 Hz), maximal frontocentral region. Hyperventilation and photic stimulation were not performed.    ? ?IMPRESSION: ?This study is within normal limits. No seizures or epileptiform discharges were seen throughout the recording. ? ?Lora Havens  ? ?

## 2021-11-08 NOTE — Progress Notes (Signed)
Eeg done 

## 2021-11-08 NOTE — Assessment & Plan Note (Addendum)
--  BP intermittently low  ?Plan: ?--hold home lasix, aldactone and losartan for now ?

## 2021-11-09 DIAGNOSIS — F419 Anxiety disorder, unspecified: Secondary | ICD-10-CM

## 2021-11-09 DIAGNOSIS — G43909 Migraine, unspecified, not intractable, without status migrainosus: Secondary | ICD-10-CM

## 2021-11-09 DIAGNOSIS — G629 Polyneuropathy, unspecified: Secondary | ICD-10-CM

## 2021-11-09 LAB — CBC
HCT: 38.6 % (ref 36.0–46.0)
Hemoglobin: 13.1 g/dL (ref 12.0–15.0)
MCH: 29.3 pg (ref 26.0–34.0)
MCHC: 33.9 g/dL (ref 30.0–36.0)
MCV: 86.4 fL (ref 80.0–100.0)
Platelets: 214 10*3/uL (ref 150–400)
RBC: 4.47 MIL/uL (ref 3.87–5.11)
RDW: 13.2 % (ref 11.5–15.5)
WBC: 11.7 10*3/uL — ABNORMAL HIGH (ref 4.0–10.5)
nRBC: 0 % (ref 0.0–0.2)

## 2021-11-09 LAB — BASIC METABOLIC PANEL
Anion gap: 11 (ref 5–15)
BUN: 15 mg/dL (ref 6–20)
CO2: 28 mmol/L (ref 22–32)
Calcium: 8.4 mg/dL — ABNORMAL LOW (ref 8.9–10.3)
Chloride: 100 mmol/L (ref 98–111)
Creatinine, Ser: 0.86 mg/dL (ref 0.44–1.00)
GFR, Estimated: >60 mL/min (ref >60)
Glucose, Bld: 170 mg/dL — ABNORMAL HIGH (ref 70–99)
Potassium: 2.9 mmol/L — ABNORMAL LOW (ref 3.5–5.1)
Sodium: 139 mmol/L (ref 135–145)

## 2021-11-09 LAB — MAGNESIUM: Magnesium: 2 mg/dL (ref 1.7–2.4)

## 2021-11-09 MED ORDER — POTASSIUM CHLORIDE 10 MEQ/100ML IV SOLN
10.0000 meq | INTRAVENOUS | Status: AC
  Administered 2021-11-09 (×2): 10 meq via INTRAVENOUS
  Filled 2021-11-09 (×3): qty 100

## 2021-11-09 MED ORDER — POTASSIUM CHLORIDE CRYS ER 20 MEQ PO TBCR
40.0000 meq | EXTENDED_RELEASE_TABLET | Freq: Once | ORAL | Status: AC
  Administered 2021-11-09: 40 meq via ORAL
  Filled 2021-11-09: qty 2

## 2021-11-09 MED ORDER — FENTANYL 50 MCG/HR TD PT72
1.0000 | MEDICATED_PATCH | TRANSDERMAL | Status: DC
  Administered 2021-11-09: 1 via TRANSDERMAL
  Filled 2021-11-09: qty 1

## 2021-11-09 MED ORDER — DIPHENHYDRAMINE HCL 50 MG/ML IJ SOLN
50.0000 mg | Freq: Once | INTRAMUSCULAR | Status: AC
  Administered 2021-11-09: 50 mg via INTRAVENOUS
  Filled 2021-11-09: qty 1

## 2021-11-09 MED ORDER — METOPROLOL SUCCINATE ER 25 MG PO TB24
25.0000 mg | ORAL_TABLET | Freq: Two times a day (BID) | ORAL | Status: DC
Start: 2021-11-09 — End: 2021-11-10
  Administered 2021-11-09 – 2021-11-10 (×2): 25 mg via ORAL
  Filled 2021-11-09 (×2): qty 1

## 2021-11-09 MED ORDER — POTASSIUM CHLORIDE CRYS ER 20 MEQ PO TBCR: 40.0000 meq | EXTENDED_RELEASE_TABLET | Freq: Two times a day (BID) | ORAL | Status: DC

## 2021-11-09 MED ORDER — BUTALBITAL-APAP-CAFFEINE 50-325-40 MG PO TABS
2.0000 | ORAL_TABLET | Freq: Once | ORAL | Status: AC
  Administered 2021-11-09: 2 via ORAL
  Filled 2021-11-09: qty 2

## 2021-11-09 MED ORDER — PROCHLORPERAZINE EDISYLATE 10 MG/2ML IJ SOLN
10.0000 mg | Freq: Once | INTRAMUSCULAR | Status: AC
  Administered 2021-11-09: 10 mg via INTRAVENOUS
  Filled 2021-11-09: qty 2

## 2021-11-09 MED ORDER — MORPHINE SULFATE 15 MG PO TABS: 15.0000 mg | ORAL_TABLET | ORAL | Status: DC | PRN

## 2021-11-09 MED ORDER — OXYCODONE-ACETAMINOPHEN 5-325 MG PO TABS
1.0000 | ORAL_TABLET | Freq: Three times a day (TID) | ORAL | Status: DC | PRN
  Administered 2021-11-09 – 2021-11-10 (×2): 1 via ORAL
  Filled 2021-11-09 (×2): qty 1

## 2021-11-09 NOTE — Assessment & Plan Note (Signed)
--  cont home ativan PRN ?

## 2021-11-09 NOTE — Assessment & Plan Note (Signed)
--  monitor and replete PRN with oral potassium ?

## 2021-11-09 NOTE — Assessment & Plan Note (Signed)
--  cont home gabapentin ?

## 2021-11-09 NOTE — Progress Notes (Addendum)
?  Progress Note ? ? ?Patient: Brittany Nguyen ALP:[id number] DOB: 24-May-1966 DOA: 11/07/2021     2 ?DOS: the patient was seen and examined on 11/09/2021 ?  ?Brief hospital course: ?Ulyana Pitones Sizelove is a 56 y.o. female with history of seizure disorder on Keppra, diabetes, COPD, CHF status post AICD, and prolonged QT, who presents with 2 witnessed seizures.  Per EMS, the patient was a passenger in a car with family when she had a generalized seizure that lasted several minutes.  She was postictal, then had a second seizure lasting several minutes with EMS.  ? ?Assessment and Plan: ?* Breakthrough seizure ([place]) ?ED physician consulted w/ neurology  ?--switch from home keppra to Vimpat ?Plan: ?--cont Vimpat 100 mg BID ?--IV ativan PRN for seizure ? ?Anxiety ?--cont home ativan PRN ? ?Neuropathy ?--cont home gabapentin ? ?Migraine ?--started today.  Not improved with home percocet and fioricet. ?Plan: ?--trail IV benadryl and IV compazine  ? ?At risk for prolonged QT interval syndrome - borderline long QT on EKG 11/07/21 in ED ?Hold potential QT prolonging medications ? ?Asthma ?DuoNeb prn ?No respiratory distress at this time ? ?Neuroendocrine neoplasm of stomach ?Following w/ oncology ?--resume Fentanyl patch  ?--cont home Percocet PRN ? ?COPD (chronic obstructive pulmonary disease) ([place]) ?No home inhalers on file ?DuoNeb prn ?No respiratory distress at this time ? ? ?Hypotension ?--BP intermittently low  ?Plan: ?--hold home lasix, aldactone and losartan for now ? ?RLS (restless legs syndrome) ?--cont home ropinirole ? ?Hypokalemia ?--monitor and replete PRN with oral potassium ? ? ? ? ?  ? ?Subjective:  ?Pt has been alert and mental status back to baseline.  Pt however had headache, which failed home percocet (what she normally takes) and Fioricet.  Trying IV benadryl and IV compazine next. ? ?No more seizure since yesterday afternoon.  ? ? ?Physical Exam: ?Vitals:  ? 11/09/21 1000 11/09/21 1132 11/09/21 1540 11/09/21  2030  ?BP: 96/60 (!) 103/52 103/66 (!) 101/59  ?Pulse: 76 72 81 63  ?Resp: (!) '22 16 19 16  '$ ?Temp:  98 ?F (36.7 ?C) 98.2 ?F (36.8 ?C) 98.1 ?F (36.7 ?C)  ?TempSrc:  Oral Oral Oral  ?SpO2: 94% 97% 95% 94%  ?Weight:      ?Height:      ? ? ?Constitutional: NAD, AAOx3 ?HEENT: conjunctivae and lids normal, EOMI ?CV: No cyanosis.   ?RESP: normal respiratory effort, on RA ?SKIN: warm, dry ?Neuro: II - XII grossly intact.   ?Psych: Normal mood and affect.  Appropriate judgement and reason ? ? ?Data Reviewed: ? ?Family Communication:  ?Disposition: ?Status is: Inpatient ?Remains inpatient appropriate because: need to ensure seizure free for 1 day before discharge. ? ? Planned Discharge Destination: Home ?Likely tomorrow if no more seizure. ? ? ? ? ?Time spent: 50 minutes ? ?Author: ?Enzo Bi, MD ?11/09/2021 10:04 PM ? ?For on call review [email or web address].  ?

## 2021-11-09 NOTE — Assessment & Plan Note (Signed)
--  started today.  Not improved with home percocet and fioricet. ?Plan: ?--trail IV benadryl and IV compazine  ?

## 2021-11-09 NOTE — Progress Notes (Incomplete)
{  Select_TRH_[id number]} 

## 2021-11-10 LAB — BASIC METABOLIC PANEL
Anion gap: 7 (ref 5–15)
BUN: 15 mg/dL (ref 6–20)
CO2: 27 mmol/L (ref 22–32)
Calcium: 8.5 mg/dL — ABNORMAL LOW (ref 8.9–10.3)
Chloride: 110 mmol/L (ref 98–111)
Creatinine, Ser: 0.82 mg/dL (ref 0.44–1.00)
GFR, Estimated: >60 mL/min (ref >60)
Glucose, Bld: 114 mg/dL — ABNORMAL HIGH (ref 70–99)
Potassium: 3.5 mmol/L (ref 3.5–5.1)
Sodium: 144 mmol/L (ref 135–145)

## 2021-11-10 LAB — MAGNESIUM: Magnesium: 2.2 mg/dL (ref 1.7–2.4)

## 2021-11-10 LAB — CBC
HCT: 38.2 % (ref 36.0–46.0)
Hemoglobin: 12.5 g/dL (ref 12.0–15.0)
MCH: 28.9 pg (ref 26.0–34.0)
MCHC: 32.7 g/dL (ref 30.0–36.0)
MCV: 88.4 fL (ref 80.0–100.0)
Platelets: 211 10*3/uL (ref 150–400)
RBC: 4.32 MIL/uL (ref 3.87–5.11)
RDW: 13.3 % (ref 11.5–15.5)
WBC: 7.5 10*3/uL (ref 4.0–10.5)
nRBC: 0 % (ref 0.0–0.2)

## 2021-11-10 MED ORDER — LORAZEPAM 1 MG PO TABS: 1.0000 mg | ORAL_TABLET | Freq: Four times a day (QID) | ORAL | 0 refills | Status: AC | PRN

## 2021-11-10 MED ORDER — METOPROLOL SUCCINATE ER 25 MG PO TB24: 25.0000 mg | ORAL_TABLET | Freq: Two times a day (BID) | ORAL | 0 refills | Status: DC

## 2021-11-10 MED ORDER — LACOSAMIDE 100 MG PO TABS: 100.0000 mg | ORAL_TABLET | Freq: Two times a day (BID) | ORAL | 1 refills | Status: DC

## 2021-11-10 NOTE — Care Management (Signed)
?  Transition of Care (TOC) Screening Note ? ? ?Patient Details  ?Name: Brittany Nguyen ?Date of Birth: 02/11/1966 ? ? ?Transition of Care (TOC) CM/SW Contact:    ?Pete Pelt, RN ?Phone Number: ?11/10/2021, 11:34 AM ? ? ? ?Transition of Care Department [facility]) has reviewed patient and no TOC needs have been identified at this time. We will continue to monitor patient advancement through interdisciplinary progression rounds. If new patient transition needs arise, please place a TOC consult. ?  ?

## 2021-11-10 NOTE — Discharge Summary (Signed)
Physician Discharge Summary   Patient: Brittany Nguyen MRN: [id number] DOB: 01-Aug-1966  Admit date:     11/07/2021  Discharge date: 11/10/21  Discharge Physician: Lorella Nimrod   PCP: [name], MD   Recommendations at discharge:  Her seizure medications has been changed, she will stop Keppra and start using Vimpat. Follow-up with neurology within a week Follow-up with primary care provider  Discharge Diagnoses: Principal Problem:   Breakthrough seizure ([place]) Active Problems:   Hypokalemia   RLS (restless legs syndrome)   Hypotension   COPD (chronic obstructive pulmonary disease) (HCC)   S/P implantation of automatic cardioverter/defibrillator (AICD)   Neuroendocrine neoplasm of stomach   Asthma   At risk for prolonged QT interval syndrome - borderline long QT on EKG 11/07/21 in ED   Migraine   Neuropathy   Anxiety   Hospital Course: Caitlynne Harbeck Welp is a 56 y.o. female with history of seizure disorder on Keppra, diabetes, COPD, CHF status post AICD, and prolonged QT, who presents with 2 witnessed seizures.  Per EMS, the patient was a passenger in a car with family when she had a generalized seizure that lasted several minutes.  She was postictal, then had a second seizure lasting several minutes with EMS.  She was admitted for breakthrough seizures.  ED provider consulted with neurology and they recommend switching her home Keppra to Vimpat which was done.  Patient remained seizure-free for more than 24 hours before discharge.  She appears at her baseline and need to follow-up with her neurologist closely for further recommendations.  Patient will continue the rest of her home medications and follow-up with her providers.  Assessment and Plan: * Breakthrough seizure [facility]) ED physician consulted w/ neurology  --switch from home keppra to Vimpat Plan: --cont Vimpat 100 mg BID --IV ativan PRN for seizure  Anxiety --cont home ativan PRN  Neuropathy --cont home  gabapentin  Migraine --started today.  Not improved with home percocet and fioricet. Plan: --trail IV benadryl and IV compazine   At risk for prolonged QT interval syndrome - borderline long QT on EKG 11/07/21 in ED Hold potential QT prolonging medications  Asthma DuoNeb prn No respiratory distress at this time  Neuroendocrine neoplasm of stomach Following w/ oncology --resume Fentanyl patch  --cont home Percocet PRN  COPD (chronic obstructive pulmonary disease) (HCC) No home inhalers on file DuoNeb prn No respiratory distress at this time   Hypotension --BP intermittently low  Plan: --hold home lasix, aldactone and losartan for now  RLS (restless legs syndrome) --cont home ropinirole  Hypokalemia --monitor and replete PRN with oral potassium   Pain control - [place] Controlled Substance Reporting System database was reviewed. and patient was instructed, not to drive, operate heavy machinery, perform activities at heights, swimming or participation in water activities or provide baby-sitting services while on Pain, Sleep and Anxiety Medications; until their outpatient Physician has advised to do so again. Also recommended to not to take more than prescribed Pain, Sleep and Anxiety Medications.   Consultants: Neurology Procedures performed: None Disposition: Home Diet recommendation:  Discharge Diet Orders (From admission, onward)     Start     Ordered   11/10/21 0000  Diet - low sodium heart healthy        11/10/21 0923           Regular diet DISCHARGE MEDICATION: Allergies as of 11/10/2021       Reactions   Contrast Media [iodinated Contrast Media] Shortness Of Breath  Per CT report in 2014 pt had break through contrast reaction with hives and SOB following 13 hour prep. MSY    Lidocaine Hives   Metrizamide Shortness Of Breath   Penicillins Hives, Other (See Comments)   Other reaction(s): Other (See Comments) Has patient had a PCN reaction causing  immediate rash, facial/tongue/throat swelling, SOB or lightheadedness with hypotension: Yes Has patient had a PCN reaction causing severe rash involving mucus membranes or skin necrosis: No Has patient had a PCN reaction that required hospitalization No Has patient had a PCN reaction occurring within the last 10 years: No If all of the above answers are "NO", then may proceed with Cephalosporin use. Has patient had a PCN reaction causing immediate rash, facial/tongue/throat swelling, SOB or lightheadedness with hypotension: Yes Has patient had a PCN reaction causing severe rash involving mucus membranes or skin necrosis: No Has patient had a PCN reaction that required hospitalization No Has patient had a PCN reaction occurring within the last 10 years: No If all of the above answers are "NO", then may proceed with Cephalosporin use. ediate rash, facial/tongue/throat swelling, SOB or lightheadedness with hypotension, tachy   Sacubitril-valsartan    Other reaction(s): Other (See Comments) Sz (unclear if new start entresto vs hypoglycemia)   Isosorbide Nitrate    Other reaction(s): Headache   Latex Hives   Ondansetron Other (See Comments)   Severe headache   Zofran [ondansetron Hcl] Other (See Comments)   Causes migraines   Povidone-iodine Rash   Blistering rash   Pulmicort [budesonide] Itching        Medication List     STOP taking these medications    levETIRAcetam 500 MG tablet Commonly known as: KEPPRA   promethazine 25 MG tablet Commonly known as: PHENERGAN       TAKE these medications    cyclobenzaprine 5 MG tablet Commonly known as: FLEXERIL Take 5 mg by mouth 3 (three) times daily as needed.   fentaNYL 50 MCG/HR Commonly known as: Welby 1 patch onto the skin every 3 (three) days.   furosemide 40 MG tablet Commonly known as: LASIX Take 80 mg by mouth 2 (two) times daily.   gabapentin 100 MG capsule Commonly known as: NEURONTIN Take 100 mg by  mouth at bedtime.   Lacosamide 100 MG Tabs Take 1 tablet (100 mg total) by mouth 2 (two) times daily.   levothyroxine 200 MCG tablet Commonly known as: SYNTHROID Take 200 mcg by mouth daily.   LORazepam 1 MG tablet Commonly known as: ATIVAN Take 1 mg by mouth 4 (four) times daily as needed.   losartan 25 MG tablet Commonly known as: COZAAR Take 25 mg by mouth daily.   metoprolol succinate 25 MG 24 hr tablet Commonly known as: TOPROL-XL Take 1 tablet (25 mg total) by mouth 2 (two) times daily. What changed:  medication strength how much to take   morphine 15 MG tablet Commonly known as: MSIR Take 15 mg by mouth as needed for severe pain.   oxyCODONE-acetaminophen 5-325 MG tablet Commonly known as: PERCOCET/ROXICET Take 1-2 tablets by mouth every 4 (four) hours as needed.   oxyCODONE-acetaminophen 5-325 MG tablet Commonly known as: Percocet Take 1 tablet by mouth every 4 (four) hours as needed.   potassium chloride SA 20 MEQ tablet Commonly known as: Klor-Con M20 Take 1 tablet (20 mEq total) by mouth 2 (two) times daily for 7 days.   rOPINIRole 4 MG tablet Commonly known as: REQUIP Take 4 mg by mouth at  bedtime.   spironolactone 50 MG tablet Commonly known as: ALDACTONE Take 50 mg by mouth daily.        Follow-up Information     [name], MD. Schedule an appointment as soon as possible for a visit in 1 week(s).   Specialty: Family Medicine Contact information: [address] [place] [address] [phone]         Efrain Sella, MD .   Specialty: Gastroenterology Contact information: [place] [id number] [phone]                Discharge Exam: Danley Danker Weights   11/07/21 2310  Weight: 84.9 kg   General.     In no acute distress. Pulmonary.  Lungs clear bilaterally, normal respiratory effort. CV.  Regular rate and rhythm, no JVD, rub or murmur. Abdomen.  Soft, nontender,  nondistended, BS positive. CNS.  Alert and oriented x3.  No focal neurologic deficit. Extremities.  No edema, no cyanosis, pulses intact and symmetrical. Psychiatry.  Judgment and insight appears normal.   Condition at discharge: stable  The results of significant diagnostics from this hospitalization (including imaging, microbiology, ancillary and laboratory) are listed below for reference.   Imaging Studies: CT HEAD WO CONTRAST (5MM)  Result Date: 11/07/2021 CLINICAL DATA:  Seizure while riding in the car with family. EXAM: CT HEAD WITHOUT CONTRAST TECHNIQUE: Contiguous axial images were obtained from the base of the skull through the vertex without intravenous contrast. RADIATION DOSE REDUCTION: This exam was performed according to the departmental dose-optimization program which includes automated exposure control, adjustment of the mA and/or kV according to patient size and/or use of iterative reconstruction technique. COMPARISON:  06/26/2021 FINDINGS: Brain: No evidence of acute infarction, hemorrhage, hydrocephalus, extra-axial collection or mass lesion/mass effect. Vascular: No hyperdense vessel or unexpected calcification. Skull: Normal. Negative for fracture or focal lesion. Sinuses/Orbits: Globes and orbits are unremarkable. Previous sinus surgery. Mild right frontal and minor ethmoid sinus mucosal thickening. Clear mastoid air cells. Other: None. IMPRESSION: 1. No intracranial abnormality. Electronically Signed   By: Lajean Manes M.D.   On: 11/07/2021 16:20   EEG adult  Result Date: 11/08/2021 Lora Havens, MD     11/08/2021  2:58 PM Patient Name: Lanise Mergen Tinkle MRN: [id number] Epilepsy Attending: Lora Havens Referring Physician/Provider: Duffy Bruce, MD Date: 11/08/2021 Duration: 23.51 mins Patient history: 56 year old female with history of epilepsy on Keppra who presented with breakthrough seizure.  EEG done for seizure. Level of alertness: Awake, asleep AEDs during EEG  study: LCM Technical aspects: This EEG study was done with scalp electrodes positioned according to the 10-20 International system of electrode placement. Electrical activity was acquired at a sampling rate of '500Hz'$  and reviewed with a high frequency filter of '70Hz'$  and a low frequency filter of '1Hz'$ . EEG data were recorded continuously and digitally stored. Description: The posterior dominant rhythm consists of 8.5 Hz activity of moderate voltage (25-35 uV) seen predominantly in posterior head regions, symmetric and reactive to eye opening and eye closing. Sleep was characterized by vertex waves, sleep spindles (12 to 14 Hz), maximal frontocentral region. Hyperventilation and photic stimulation were not performed.   IMPRESSION: This study is within normal limits. No seizures or epileptiform discharges were seen throughout the recording. Lora Havens    Microbiology: Results for orders placed or performed during the hospital encounter of 11/07/21  Resp Panel by RT-PCR (Flu A&B, Covid) Nasopharyngeal Swab     Status: None  Collection Time: 11/07/21  8:36 PM   Specimen: Nasopharyngeal Swab; Nasopharyngeal(NP) swabs in vial transport medium  Result Value Ref Range Status   SARS Coronavirus 2 by RT PCR NEGATIVE NEGATIVE Final    Comment: (NOTE) SARS-CoV-2 target nucleic acids are NOT DETECTED.  The SARS-CoV-2 RNA is generally detectable in upper respiratory specimens during the acute phase of infection. The lowest concentration of SARS-CoV-2 viral copies this assay can detect is 138 copies/mL. A negative result does not preclude SARS-Cov-2 infection and should not be used as the sole basis for treatment or other patient management decisions. A negative result may occur with  improper specimen collection/handling, submission of specimen other than nasopharyngeal swab, presence of viral mutation(s) within the areas targeted by this assay, and inadequate number of viral copies(<138 copies/mL). A  negative result must be combined with clinical observations, patient history, and epidemiological information. The expected result is Negative.  Fact Sheet for Patients:  EntrepreneurPulse.com.au  Fact Sheet for Healthcare Providers:  IncredibleEmployment.be  This test is no t yet approved or cleared by the [place] FDA and  has been authorized for detection and/or diagnosis of SARS-CoV-2 by FDA under an Emergency Use Authorization (EUA). This EUA will remain  in effect (meaning this test can be used) for the duration of the COVID-19 declaration under Section 564(b)(1) of the Act, 21 U.S.C.section 360bbb-3(b)(1), unless the authorization is terminated  or revoked sooner.       Influenza A by PCR NEGATIVE NEGATIVE Final   Influenza B by PCR NEGATIVE NEGATIVE Final    Comment: (NOTE) The Xpert Xpress SARS-CoV-2/FLU/RSV plus assay is intended as an aid in the diagnosis of influenza from Nasopharyngeal swab specimens and should not be used as a sole basis for treatment. Nasal washings and aspirates are unacceptable for Xpert Xpress SARS-CoV-2/FLU/RSV testing.  Fact Sheet for Patients: EntrepreneurPulse.com.au  Fact Sheet for Healthcare Providers: IncredibleEmployment.be  This test is not yet approved or cleared by the [place] FDA and has been authorized for detection and/or diagnosis of SARS-CoV-2 by FDA under an Emergency Use Authorization (EUA). This EUA will remain in effect (meaning this test can be used) for the duration of the COVID-19 declaration under Section 564(b)(1) of the Act, 21 U.S.C. section 360bbb-3(b)(1), unless the authorization is terminated or revoked.  Performed at [facility], [address] [place], [place] [address]   MRSA Next Gen by PCR, Nasal     Status: None   Collection Time: 11/07/21 11:18 PM   Specimen: Nasal Mucosa; Nasal Swab  Result Value Ref  Range Status   MRSA by PCR Next Gen NOT DETECTED NOT DETECTED Final    Comment: (NOTE) The GeneXpert MRSA Assay (FDA approved for NASAL specimens only), is one component of a comprehensive MRSA colonization surveillance program. It is not intended to diagnose MRSA infection nor to guide or monitor treatment for MRSA infections. Test performance is not FDA approved in patients less than 23 years old. Performed at [facility], [address] [place], [place] [address]     Labs: CBC: Recent Labs  Lab 11/07/21 1331 11/09/21 0331 11/10/21 0524  WBC 6.8 11.7* 7.5  NEUTROABS 5.4  --   --   HGB 13.6 13.1 12.5  HCT 40.8 38.6 38.2  MCV 87.6 86.4 88.4  PLT 217 214 321   Basic Metabolic Panel: Recent Labs  Lab 11/07/21 1331 11/09/21 0331 11/10/21 0524  NA 141 139 144  K 3.7 2.9* 3.5  CL 106 100 110  CO2 '25 28 27  '$ GLUCOSE 140* 170* 114*  BUN '13 15 15  '$ CREATININE 0.93 0.86 0.82  CALCIUM 9.2 8.4* 8.5*  MG 2.4 2.0 2.2   Liver Function Tests: No results for input(s): AST, ALT, ALKPHOS, BILITOT, PROT, ALBUMIN in the last 168 hours. CBG: Recent Labs  Lab 11/07/21 2317  GLUCAP 104*    Discharge time spent: greater than 30 minutes.  This record has been created using Systems analyst. Errors have been sought and corrected,but may not always be located. Such creation errors do not reflect on the standard of care.   Signed: Lorella Nimrod, MD [facility] 11/10/2021

## 2021-11-10 NOTE — Plan of Care (Signed)

## 2021-11-10 NOTE — Care Management Important Message (Signed)
Important Message ? ?Patient Details  ?Name: Brittany Nguyen ?MRN: [id number] ?Date of Birth: May 06, 1966 ? ? ?Medicare Important Message Given:  Yes ? ? ? ? ?Juliann Pulse A Saina Waage ?11/10/2021, 10:33 AM ?

## 2021-11-10 NOTE — Plan of Care (Signed)
?Problem: Education: ?Goal: Knowledge of General Education information will improve ?Description: Including pain rating scale, medication(s)/side effects and non-pharmacologic comfort measures ?11/10/2021 1047 by Jamont Mellin, Camillo Flaming, RN ?Outcome: Adequate for Discharge ?11/10/2021 1012 by Zair Borawski, Camillo Flaming, RN ?Outcome: Progressing ?  ?Problem: Health Behavior/Discharge Planning: ?Goal: Ability to manage health-related needs will improve ?11/10/2021 1047 by Shyah Cadmus, Camillo Flaming, RN ?Outcome: Adequate for Discharge ?11/10/2021 1012 by Belladonna Lubinski, Camillo Flaming, RN ?Outcome: Progressing ?  ?Problem: Clinical Measurements: ?Goal: Ability to maintain clinical measurements within normal limits will improve ?11/10/2021 1047 by Keita Demarco, Camillo Flaming, RN ?Outcome: Adequate for Discharge ?11/10/2021 1012 by Krystal Delduca, Camillo Flaming, RN ?Outcome: Progressing ?Goal: Will remain free from infection ?11/10/2021 1047 by Demarrius Guerrero, Camillo Flaming, RN ?Outcome: Adequate for Discharge ?11/10/2021 1012 by Dulcey Riederer, Camillo Flaming, RN ?Outcome: Progressing ?Goal: Diagnostic test results will improve ?11/10/2021 1047 by Jonn Chaikin, Camillo Flaming, RN ?Outcome: Adequate for Discharge ?11/10/2021 1012 by Acadia Thammavong, Camillo Flaming, RN ?Outcome: Progressing ?Goal: Respiratory complications will improve ?11/10/2021 1047 by Eluzer Howdeshell, Camillo Flaming, RN ?Outcome: Adequate for Discharge ?11/10/2021 1012 by Nickalas Mccarrick, Camillo Flaming, RN ?Outcome: Progressing ?Goal: Cardiovascular complication will be avoided ?11/10/2021 1047 by Lezley Bedgood, Camillo Flaming, RN ?Outcome: Adequate for Discharge ?11/10/2021 1012 by Zachary Nole, Camillo Flaming, RN ?Outcome: Progressing ?  ?Problem: Activity: ?Goal: Risk for activity intolerance will decrease ?11/10/2021 1047 by Reyn Faivre, Camillo Flaming, RN ?Outcome: Adequate for Discharge ?11/10/2021 1012 by Iyana Topor, Camillo Flaming, RN ?Outcome: Progressing ?  ?Problem: Nutrition: ?Goal: Adequate nutrition will be maintained ?11/10/2021 1047 by Ryanne Morand, Camillo Flaming, RN ?Outcome: Adequate for Discharge ?11/10/2021 1012 by Alyx Mcguirk, Camillo Flaming, RN ?Outcome: Progressing ?  ?Problem: Coping: ?Goal: Level of anxiety will decrease ?11/10/2021 1047 by Desarie Feild, Camillo Flaming, RN ?Outcome: Adequate for Discharge ?11/10/2021 1012 by Trenace Coughlin, Camillo Flaming, RN ?Outcome: Progressing ?  ?Problem: Elimination: ?Goal: Will not experience complications related to bowel motility ?11/10/2021 1047 by Caylea Foronda, Camillo Flaming, RN ?Outcome: Adequate for Discharge ?11/10/2021 1012 by Chayce Rullo, Camillo Flaming, RN ?Outcome: Progressing ?Goal: Will not experience complications related to urinary retention ?11/10/2021 1047 by Dorethea Strubel, Camillo Flaming, RN ?Outcome: Adequate for Discharge ?11/10/2021 1012 by Gilles Trimpe, Camillo Flaming, RN ?Outcome: Progressing ?  ?Problem: Pain Managment: ?Goal: General experience of comfort will improve ?11/10/2021 1047 by Aerielle Stoklosa, Camillo Flaming, RN ?Outcome: Adequate for Discharge ?11/10/2021 1012 by Juno Bozard, Camillo Flaming, RN ?Outcome: Progressing ?  ?Problem: Safety: ?Goal: Ability to remain free from injury will improve ?11/10/2021 1047 by Garek Schuneman, Camillo Flaming, RN ?Outcome: Adequate for Discharge ?11/10/2021 1012 by Tylor Courtwright, Camillo Flaming, RN ?Outcome: Progressing ?  ?Problem: Skin Integrity: ?Goal: Risk for impaired skin integrity will decrease ?11/10/2021 1047 by Ardie Dragoo, Camillo Flaming, RN ?Outcome: Adequate for Discharge ?11/10/2021 1012 by Dayzee Trower, Camillo Flaming, RN ?Outcome: Progressing ?  ?Problem: Education: ?Goal: Expressions of having a comfortable level of knowledge regarding the disease process will increase ?11/10/2021 1047 by Bryttany Tortorelli, Camillo Flaming, RN ?Outcome: Adequate for Discharge ?11/10/2021 1012 by Markeshia Giebel, Camillo Flaming, RN ?Outcome: Progressing ?  ?Problem: Coping: ?Goal: Ability to adjust to condition or change in health will improve ?11/10/2021 1047 by Brea Coleson, Camillo Flaming, RN ?Outcome: Adequate for Discharge ?11/10/2021 1012 by Shelbe Haglund, Camillo Flaming, RN ?Outcome: Progressing ?Goal: Ability to identify appropriate support needs will improve ?11/10/2021 1047 by Judyann Casasola, Camillo Flaming, RN ?Outcome: Adequate for  Discharge ?11/10/2021 1012 by Lunah Losasso, Camillo Flaming, RN ?Outcome: Progressing ?  ?Problem: Health Behavior/Discharge Planning: ?Goal: Compliance with prescribed medication regimen will improve ?11/10/2021 1047 by Trevonte Ashkar, Camillo Flaming, RN ?Outcome: Adequate for Discharge ?11/10/2021 1012 by Jabes Primo,  Camillo Flaming, RN ?Outcome: Progressing ?  ?Problem: Medication: ?Goal: Risk for medication side effects will decrease ?11/10/2021 1047 by Arthea Nobel, Camillo Flaming, RN ?Outcome: Adequate for Discharge ?11/10/2021 1012 by Dorie Ohms, Camillo Flaming, RN ?Outcome: Progressing ?  ?Problem: Clinical Measurements: ?Goal: Complications related to the disease process, condition or treatment will be avoided or minimized ?11/10/2021 1047 by Hafiz Irion, Camillo Flaming, RN ?Outcome: Adequate for Discharge ?11/10/2021 1012 by Hisako Bugh, Camillo Flaming, RN ?Outcome: Progressing ?Goal: Diagnostic test results will improve ?11/10/2021 1047 by Hartleigh Edmonston, Camillo Flaming, RN ?Outcome: Adequate for Discharge ?11/10/2021 1012 by Morgen Linebaugh, Camillo Flaming, RN ?Outcome: Progressing ?  ?Problem: Safety: ?Goal: Verbalization of understanding the information provided will improve ?11/10/2021 1047 by Shalese Strahan, Camillo Flaming, RN ?Outcome: Adequate for Discharge ?11/10/2021 1012 by Verena Shawgo, Camillo Flaming, RN ?Outcome: Progressing ?  ?Problem: Self-Concept: ?Goal: Level of anxiety will decrease ?11/10/2021 1047 by English Craighead, Camillo Flaming, RN ?Outcome: Adequate for Discharge ?11/10/2021 1012 by Dashanique Brownstein, Camillo Flaming, RN ?Outcome: Progressing ?Goal: Ability to verbalize feelings about condition will improve ?11/10/2021 1047 by Kineta Fudala, Camillo Flaming, RN ?Outcome: Adequate for Discharge ?11/10/2021 1012 by Jamauri Kruzel, Camillo Flaming, RN ?Outcome: Progressing ?  ?

## 2021-11-28 IMAGING — CR DG CHEST 2V
2 series · 2 of 2 positions shown · non-contrast
Comparison: March 30, 2020

CLINICAL DATA: Shortness of breath

EXAM:
CHEST - 2 VIEW

[chest pa]
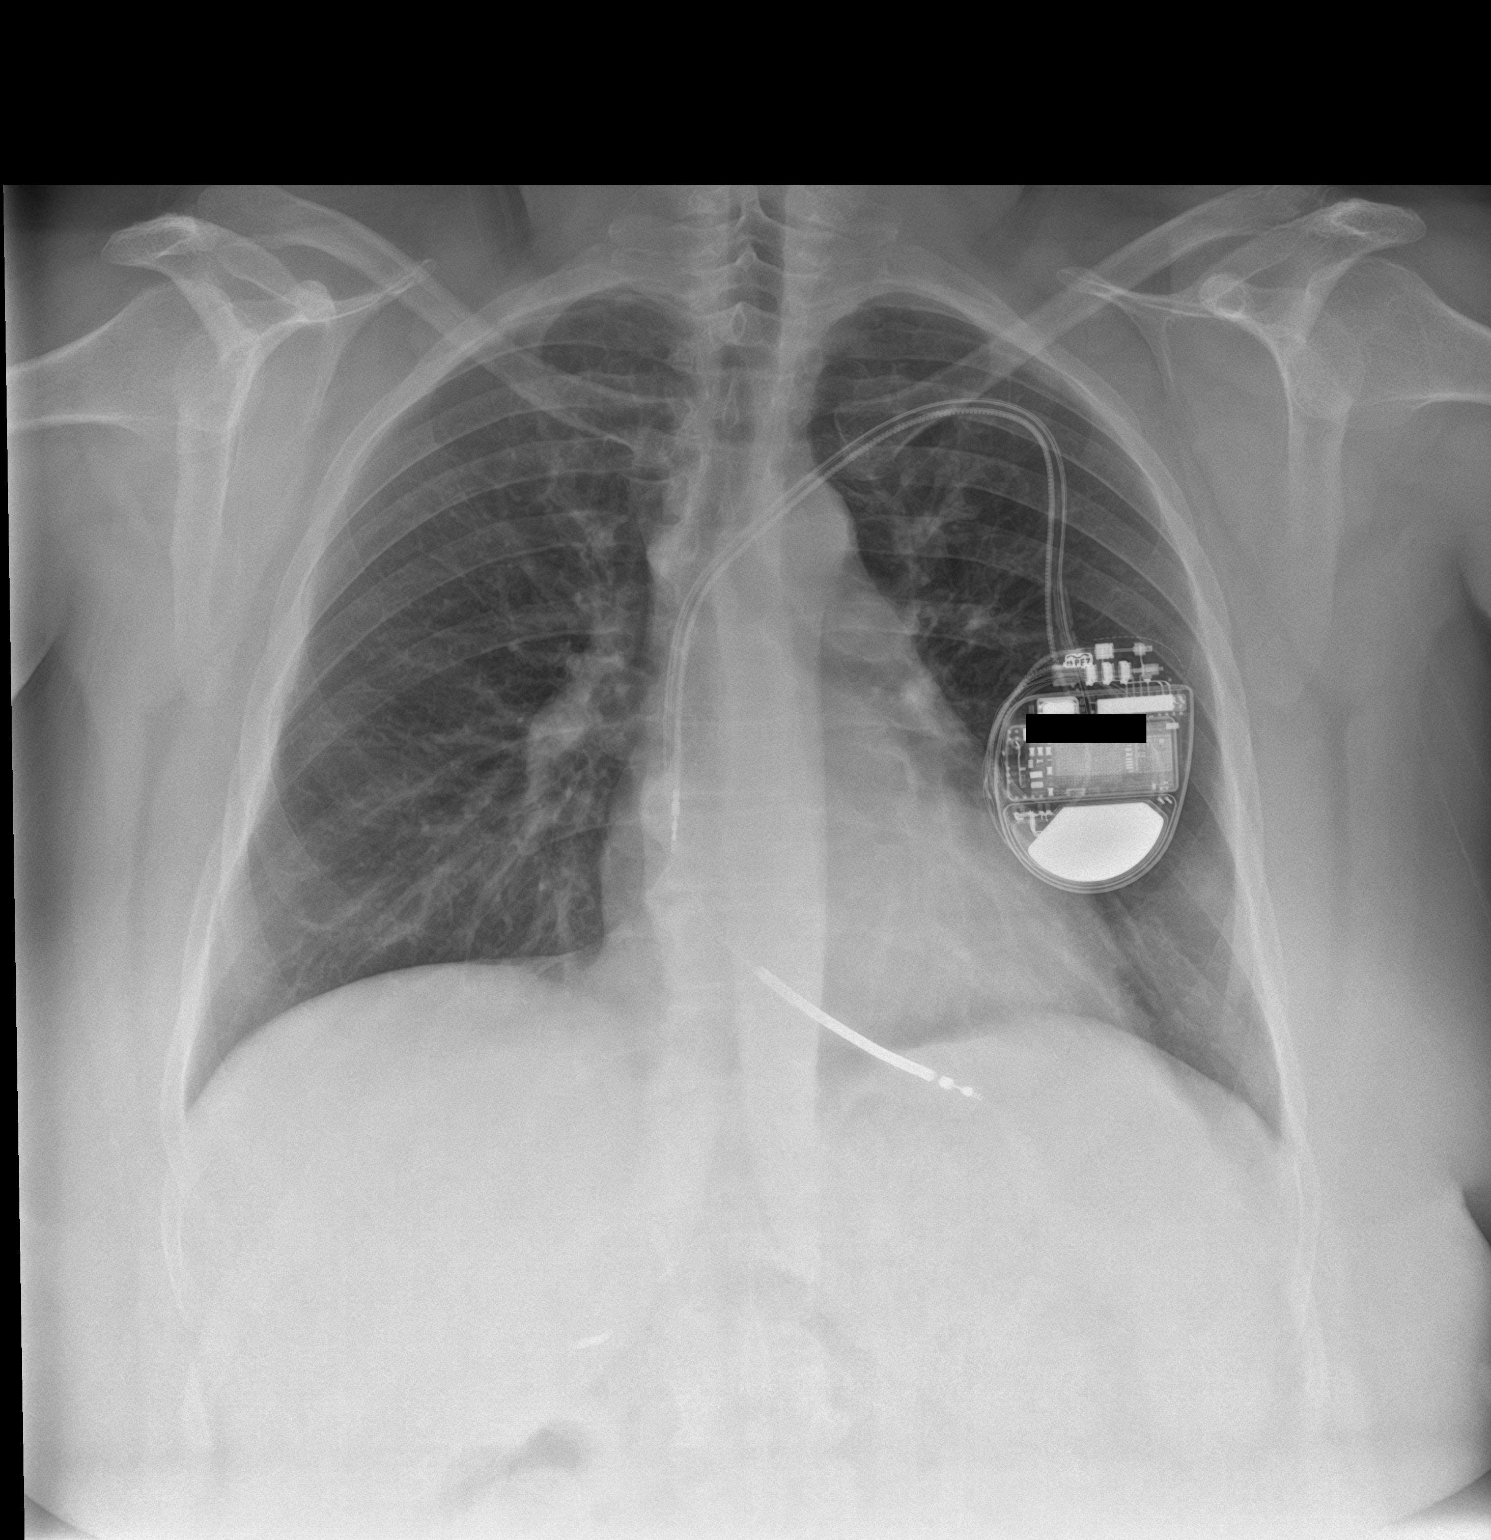

[chest lat]
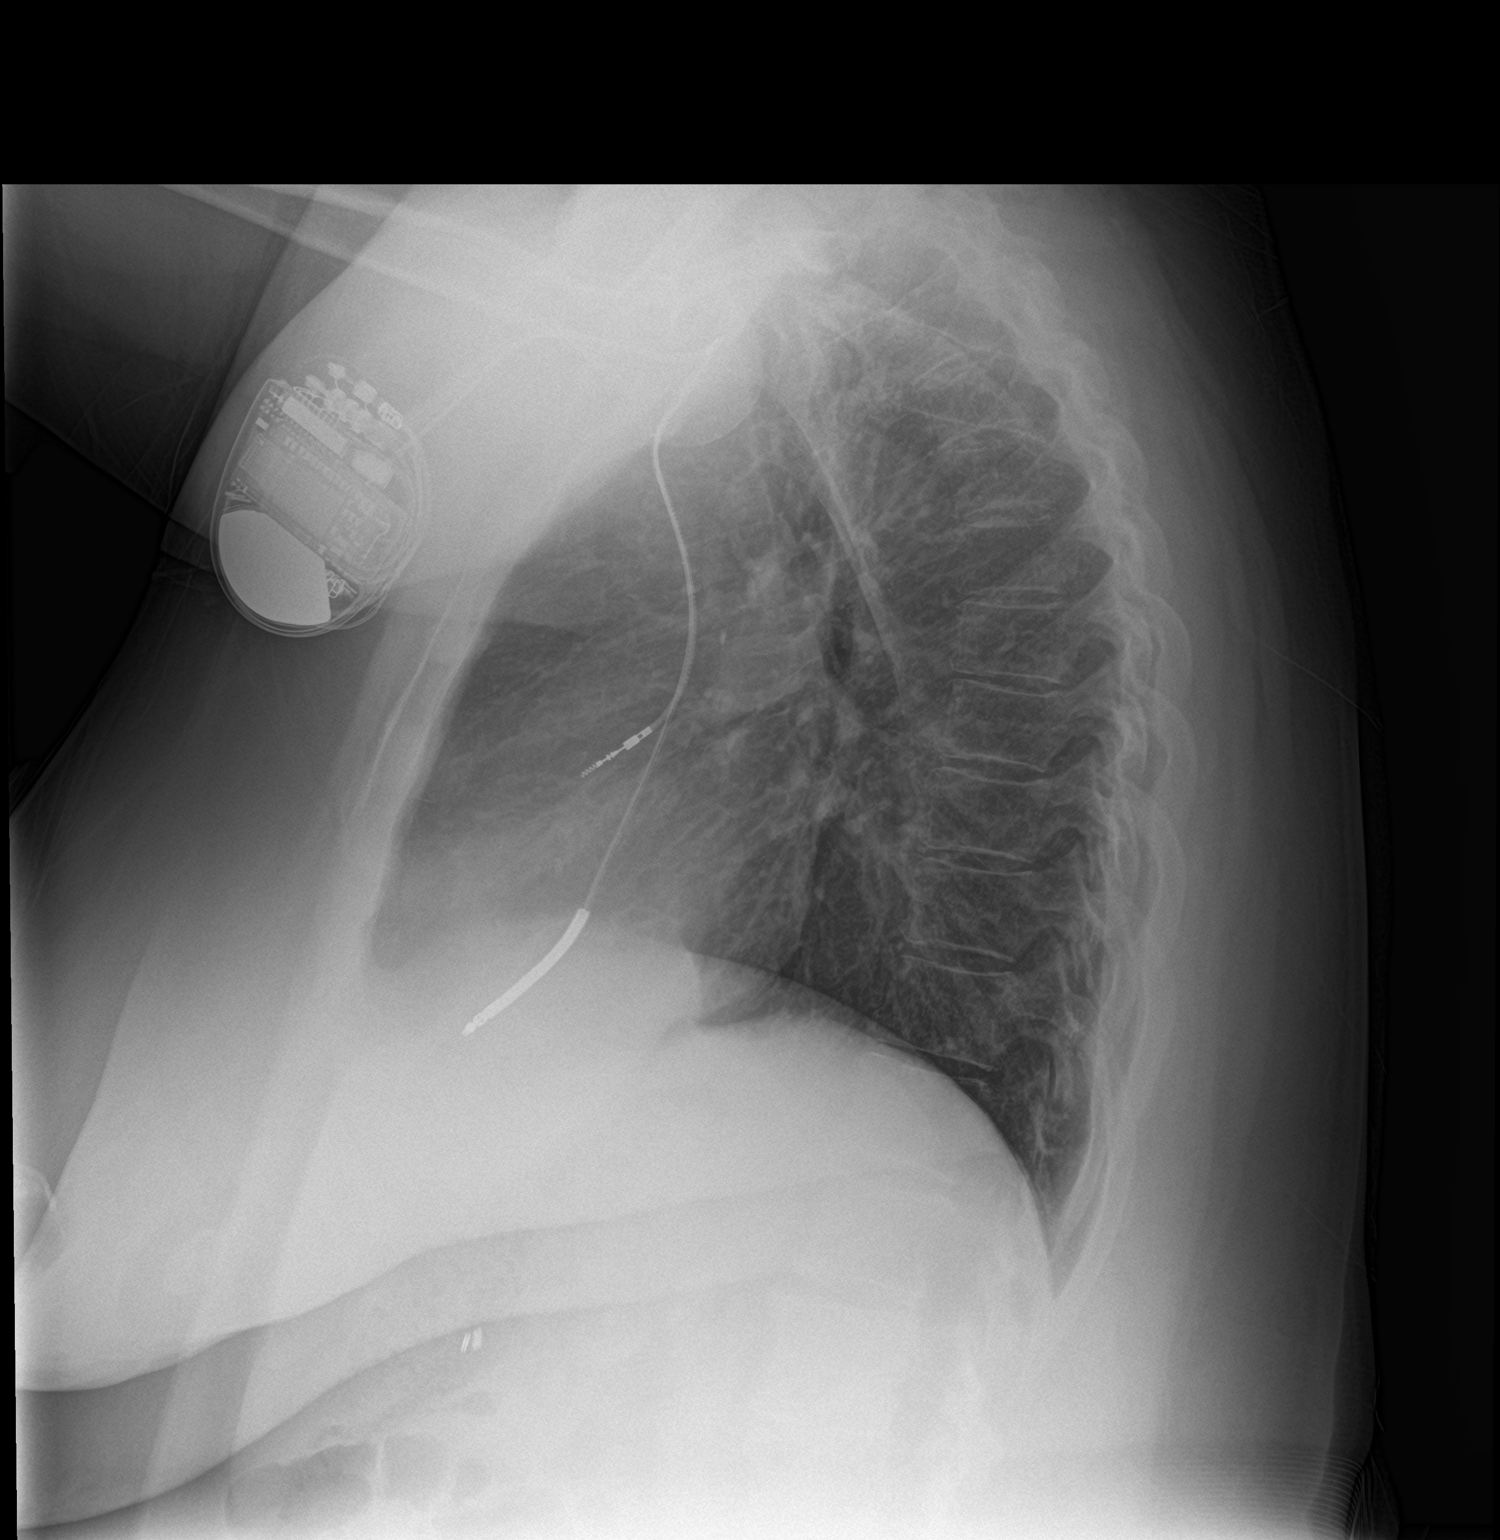

[2 of 2 positions shown; findings below may reference images not displayed]

FINDINGS: Stable AICD device. The heart, hila, mediastinum, lungs, and pleura
are within normal limits.
IMPRESSION: No active cardiopulmonary disease.

## 2021-12-01 ENCOUNTER — Encounter (HOSPITAL_COMMUNITY): Payer: Self-pay

## 2021-12-01 ENCOUNTER — Other Ambulatory Visit: Payer: Self-pay | Admitting: Student

## 2021-12-01 ENCOUNTER — Other Ambulatory Visit (HOSPITAL_COMMUNITY): Payer: Self-pay | Admitting: Student

## 2021-12-01 ENCOUNTER — Telehealth: Payer: Self-pay | Admitting: Radiology

## 2021-12-01 DIAGNOSIS — R569 Unspecified convulsions: Secondary | ICD-10-CM

## 2021-12-06 IMAGING — CR DG CHEST 2V
1 series · 2 of 2 positions shown · non-contrast
Comparison: 09/12/2020 chest radiograph.

CLINICAL DATA: Chest pain

EXAM:
CHEST - 2 VIEW

[Series 1: dg chest 2 view · 0.14mm/px · 2 of 2 slices shown]
[im 1/2]
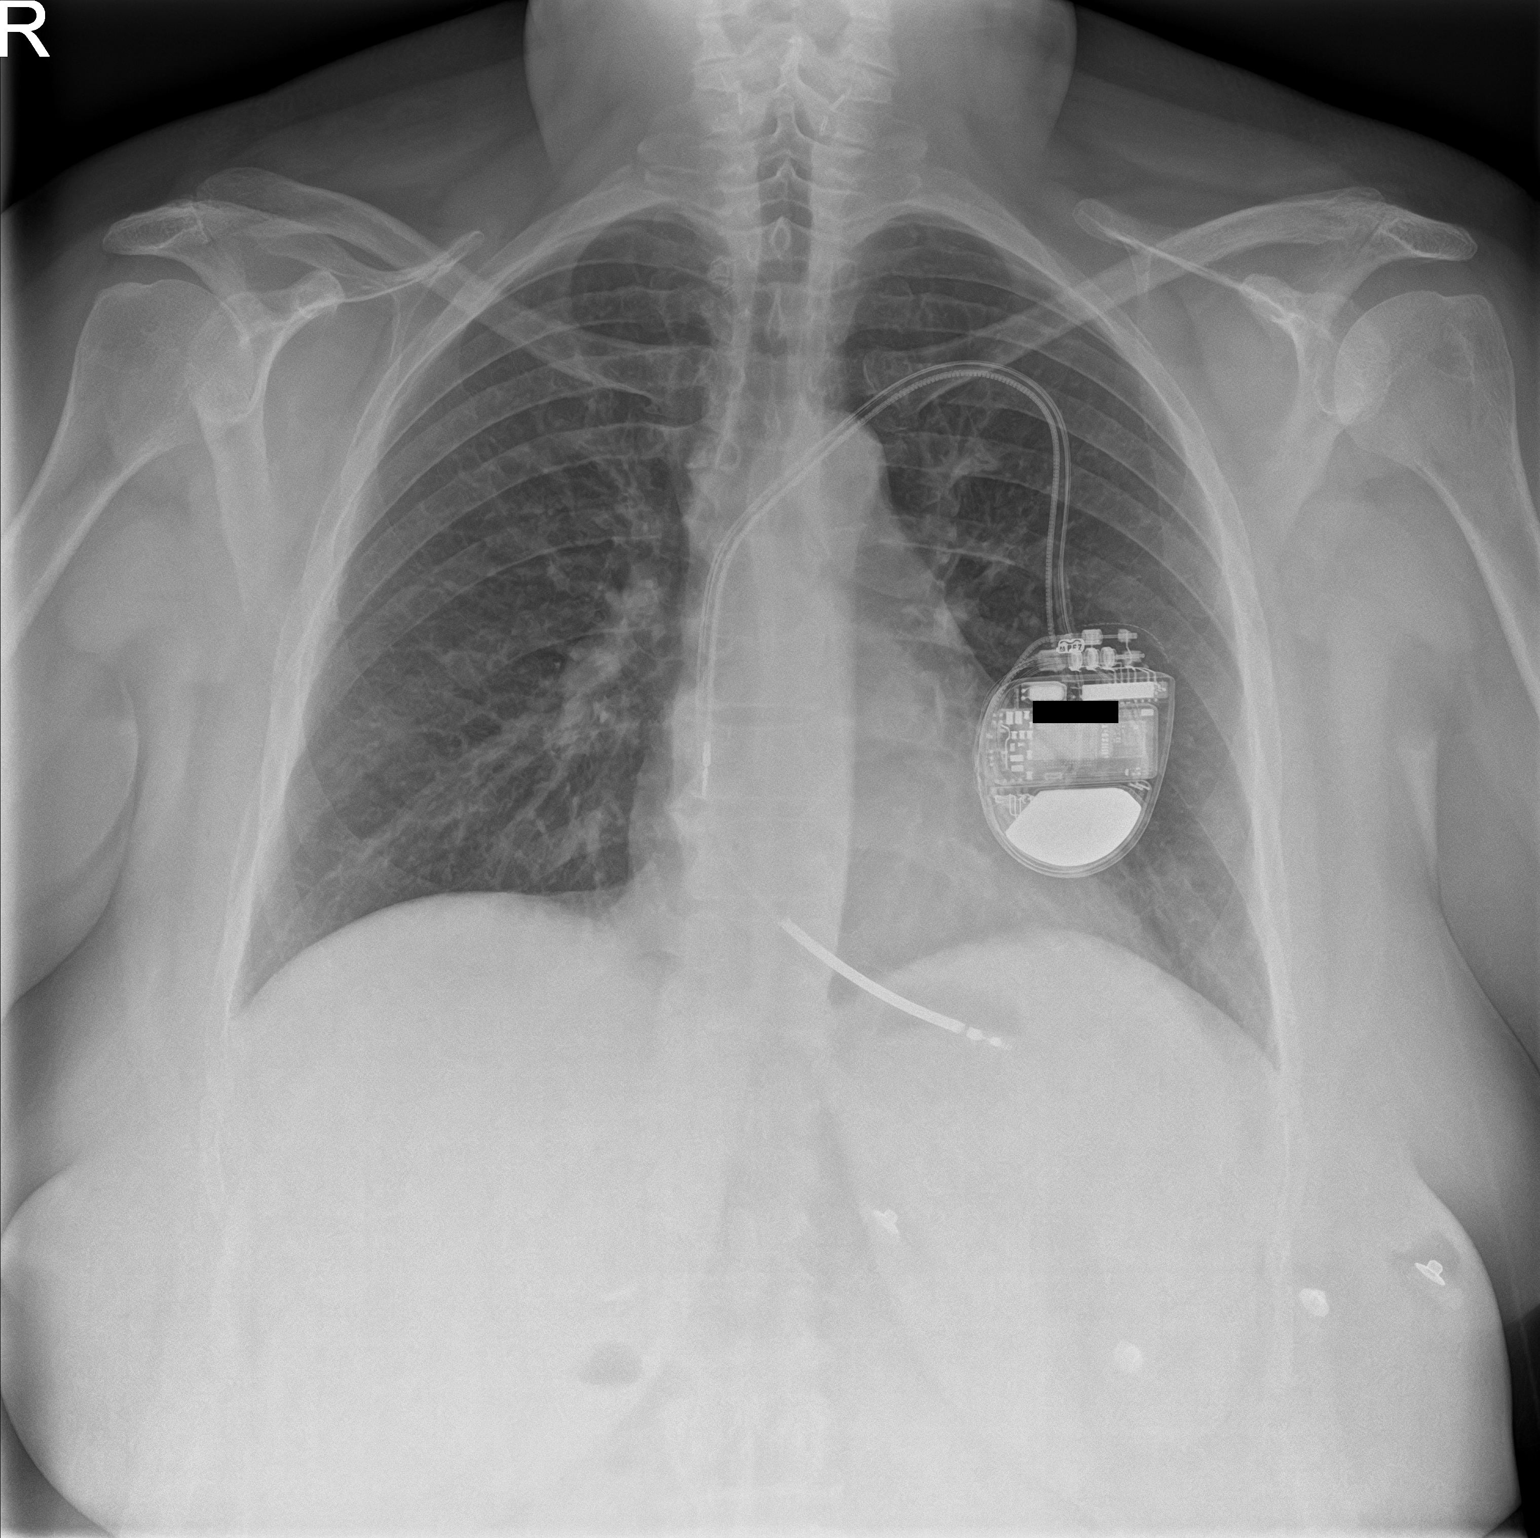
[im 2/2]
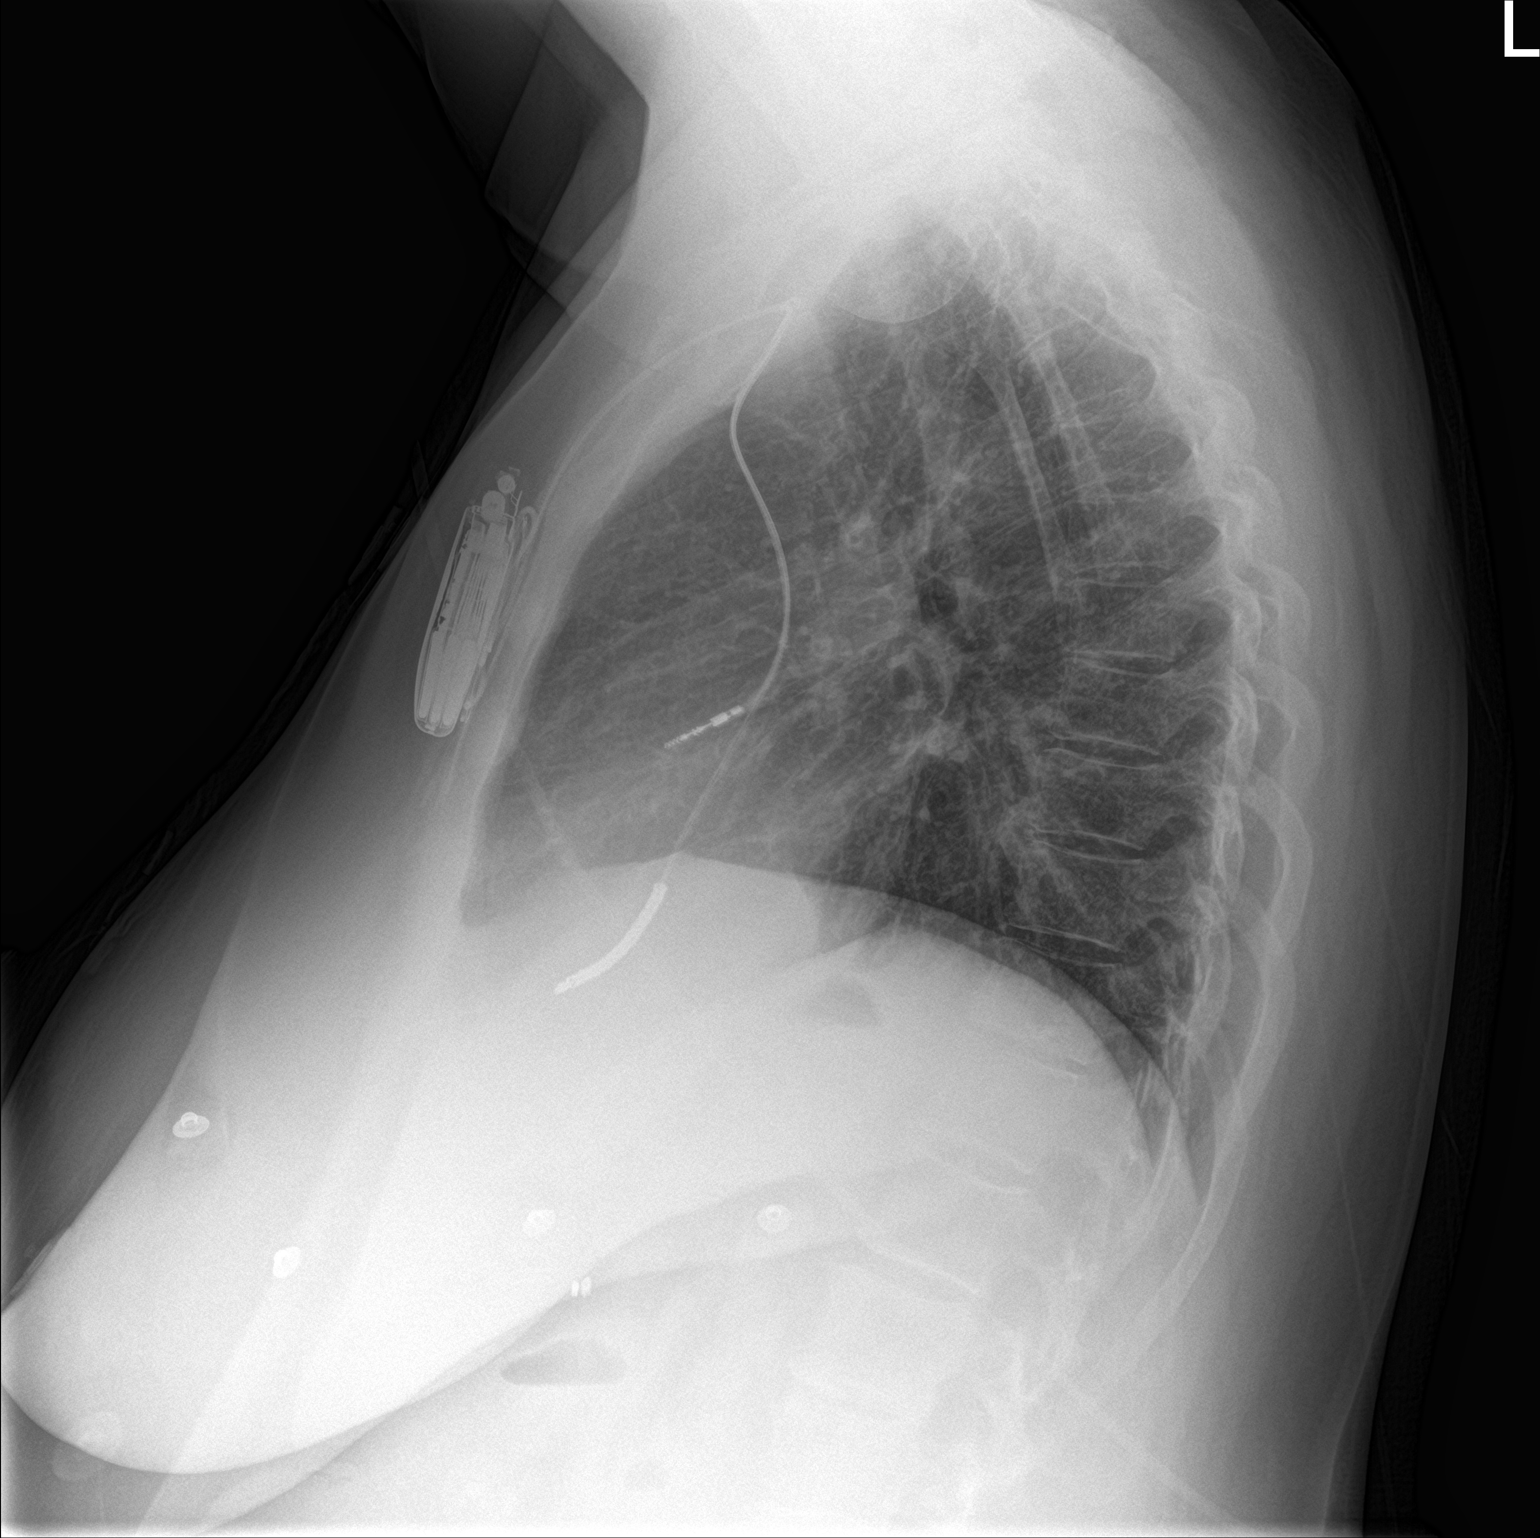

[2 of 2 positions shown; findings below may reference images not displayed]

FINDINGS: Two lead left subclavian ICD in stable configuration with lead tips
overlying the right atrium and right ventricle. Stable
cardiomediastinal silhouette with normal heart size. No
pneumothorax. No pleural effusion. Lungs appear clear, with no acute
consolidative airspace disease and no pulmonary edema.
IMPRESSION: No active cardiopulmonary disease.

## 2021-12-10 IMAGING — CR DG HIP (WITH OR WITHOUT PELVIS) 2-3V*L*
3 series · 3 of 3 positions shown · non-contrast
Comparison: None.

CLINICAL DATA: Status post fall.

EXAM:
DG HIP (WITH OR WITHOUT PELVIS) 2-3V LEFT

[pelvis ap]
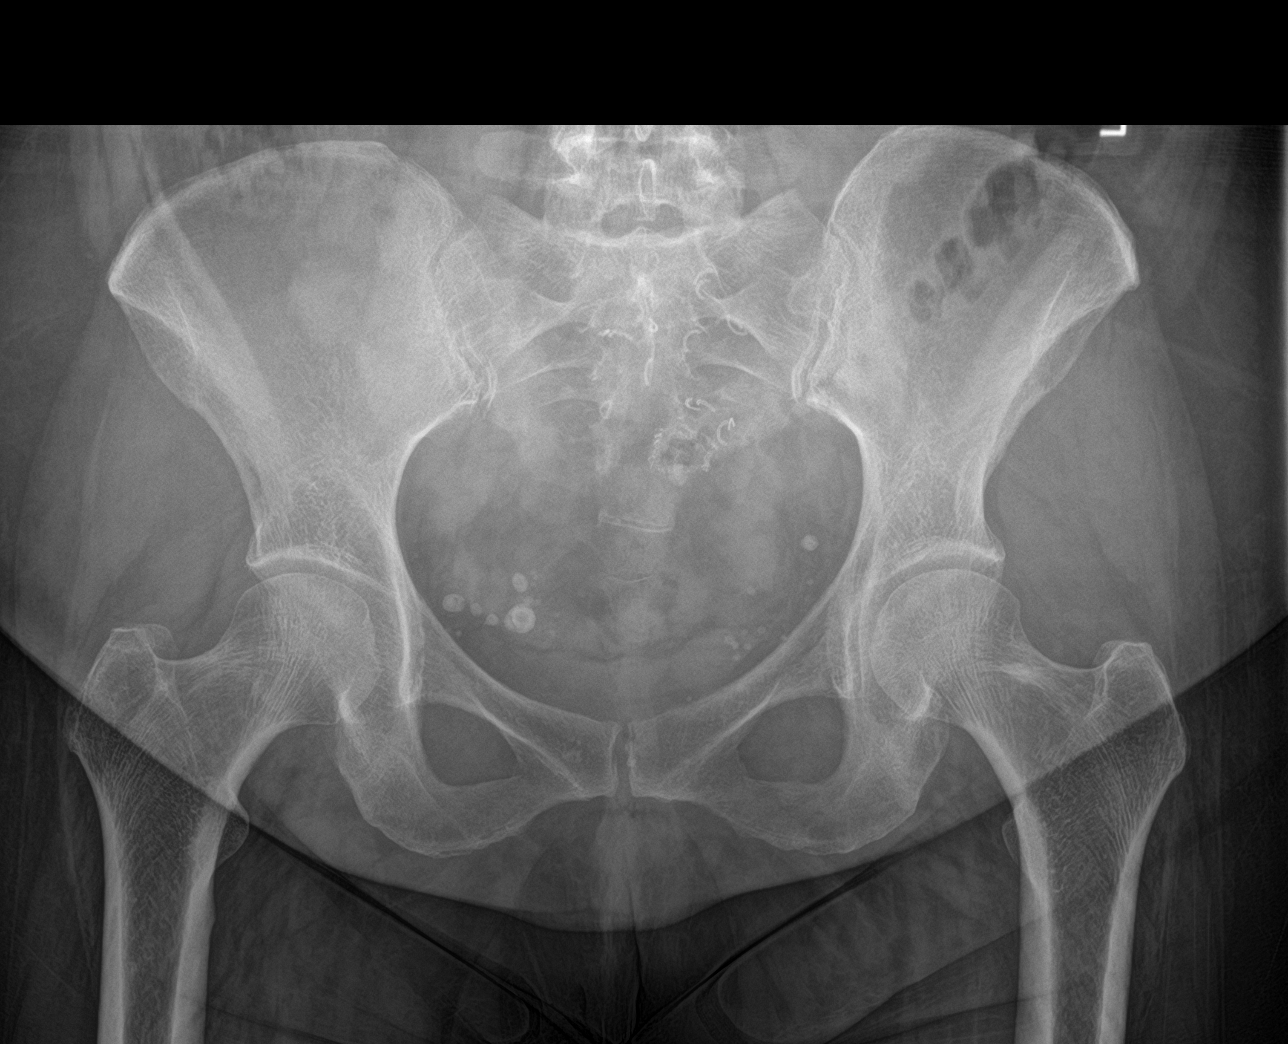

[hip ap]
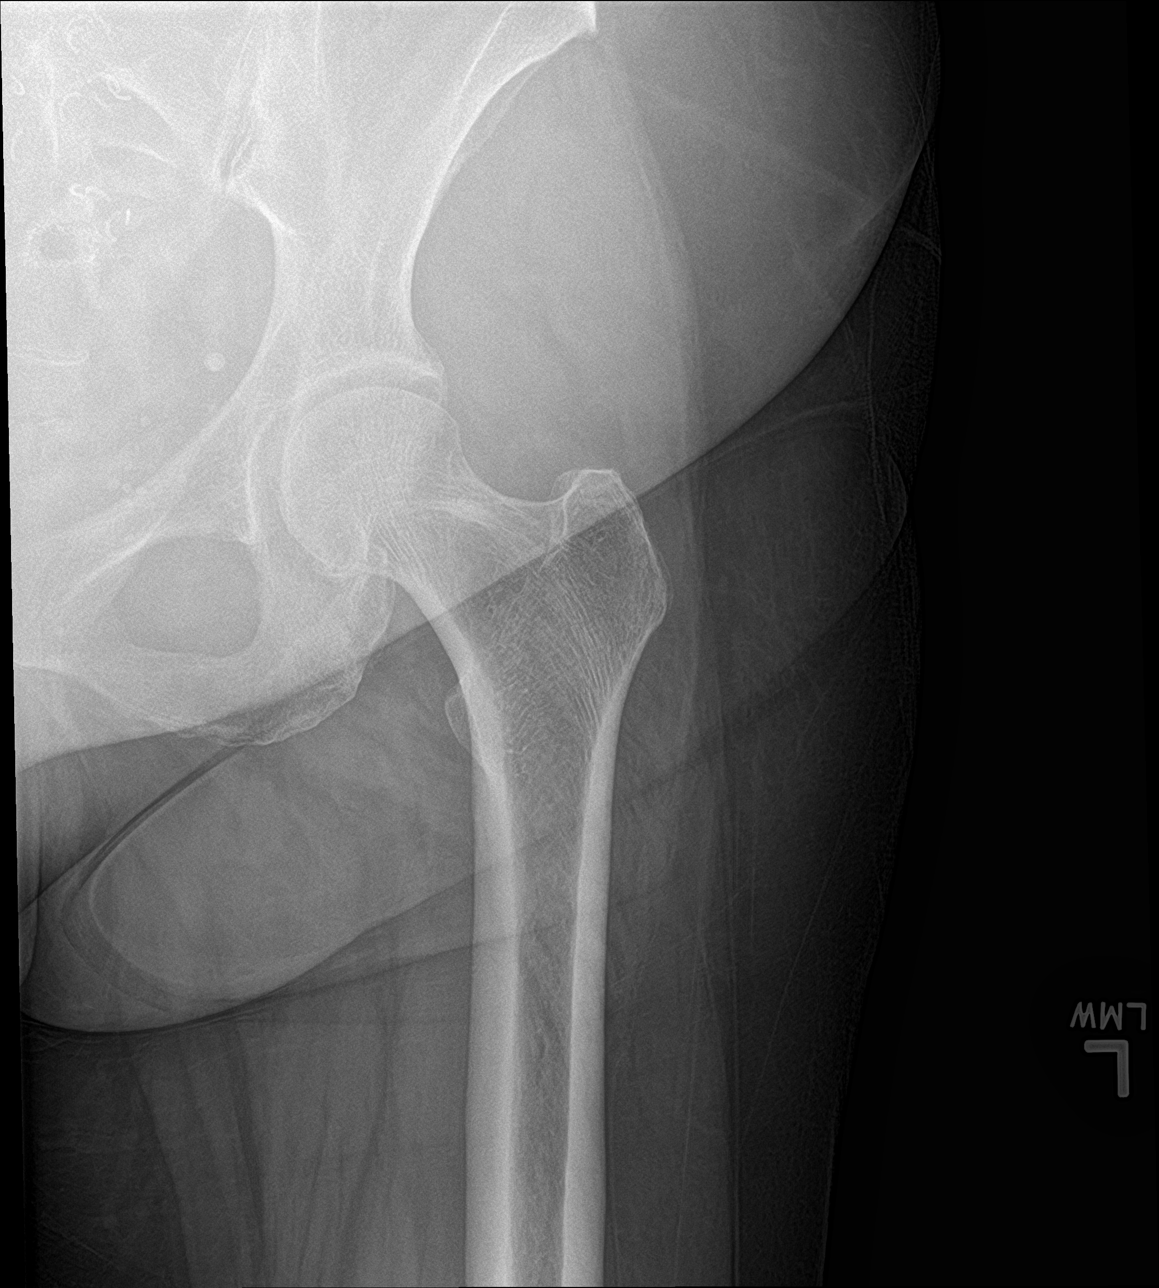

[hip lat]
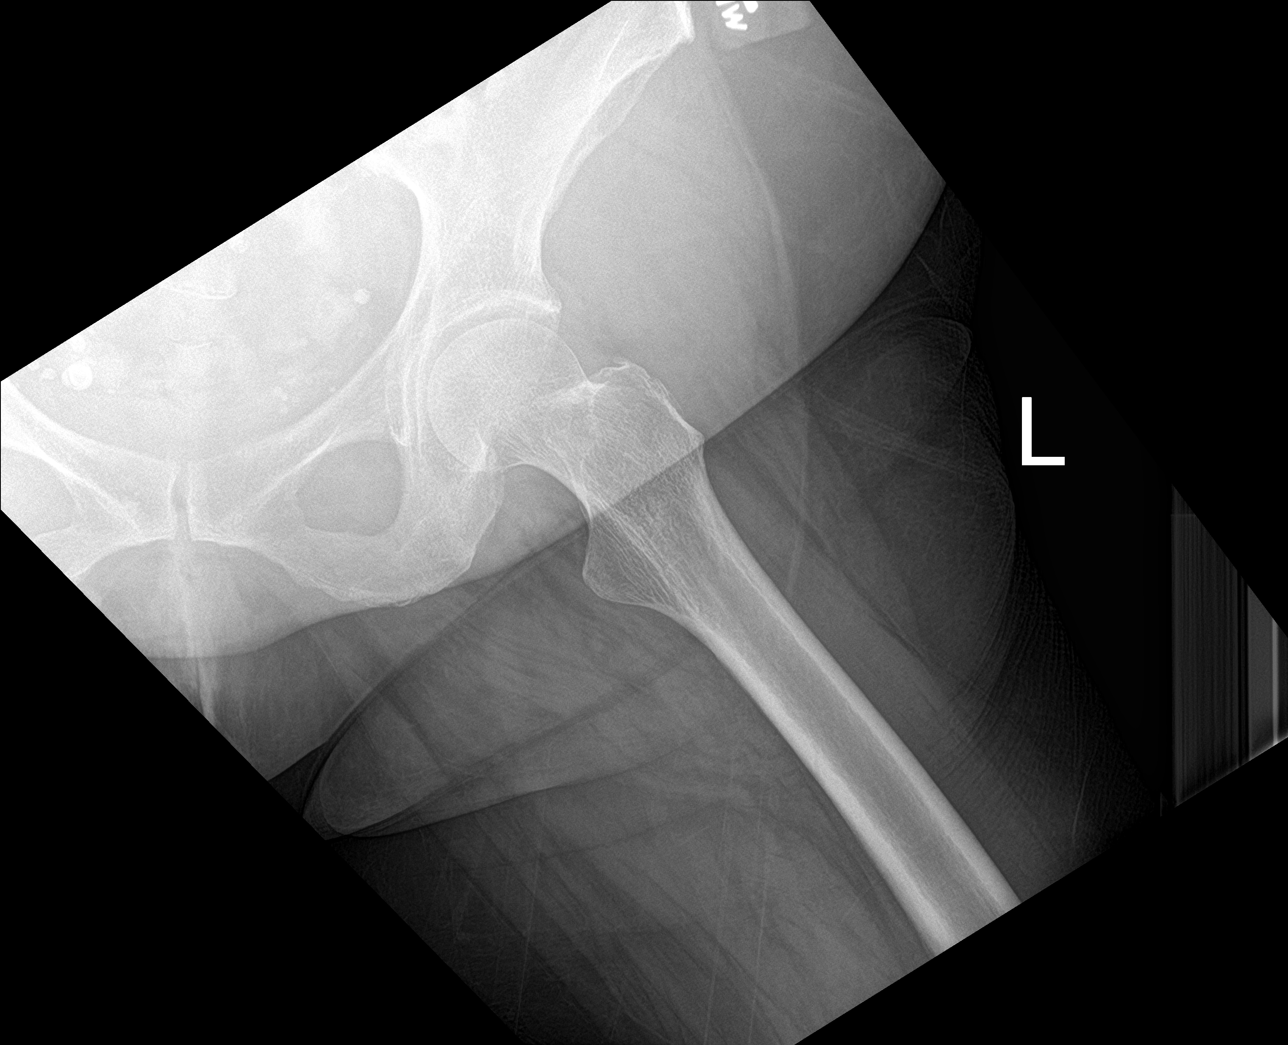

[3 of 3 positions shown; findings below may reference images not displayed]

FINDINGS: There is no evidence of hip fracture or dislocation. There is no
evidence of arthropathy or other focal bone abnormality. Surgical
clips are seen overlying the sacrum. Multiple small phleboliths are
seen within the lower pelvis.
IMPRESSION: No acute osseous abnormalities.

## 2021-12-10 IMAGING — CR DG SHOULDER 2+V*L*
3 series · 3 of 3 positions shown · non-contrast
Comparison: None.

CLINICAL DATA: Status post fall.

EXAM:
LEFT SHOULDER - 2+ VIEW

[shoulder grashey]
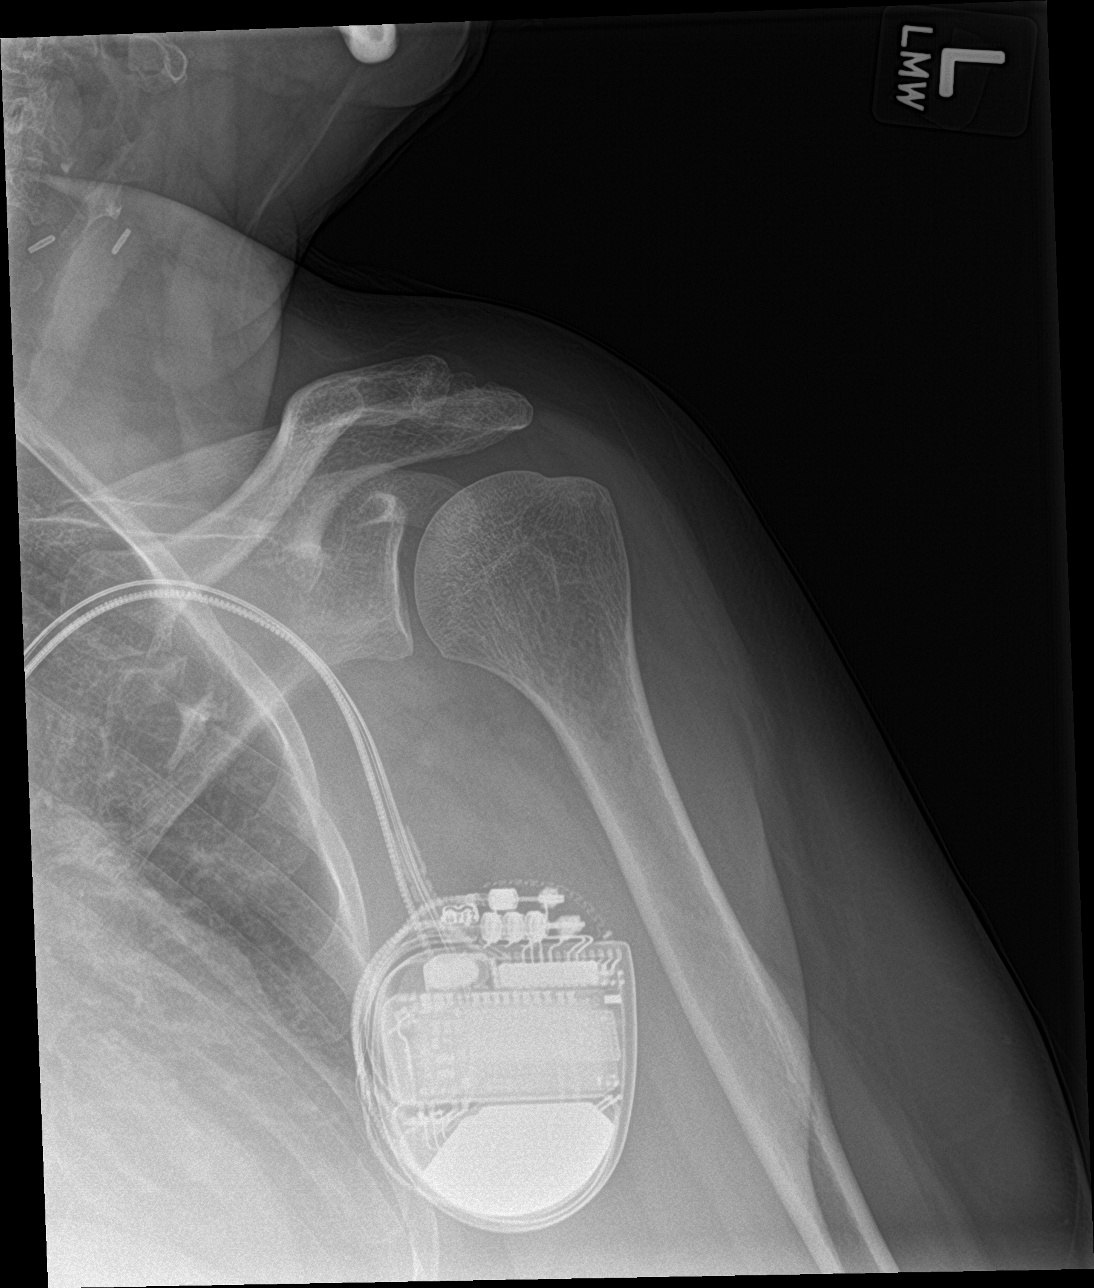

[shoulder y view]
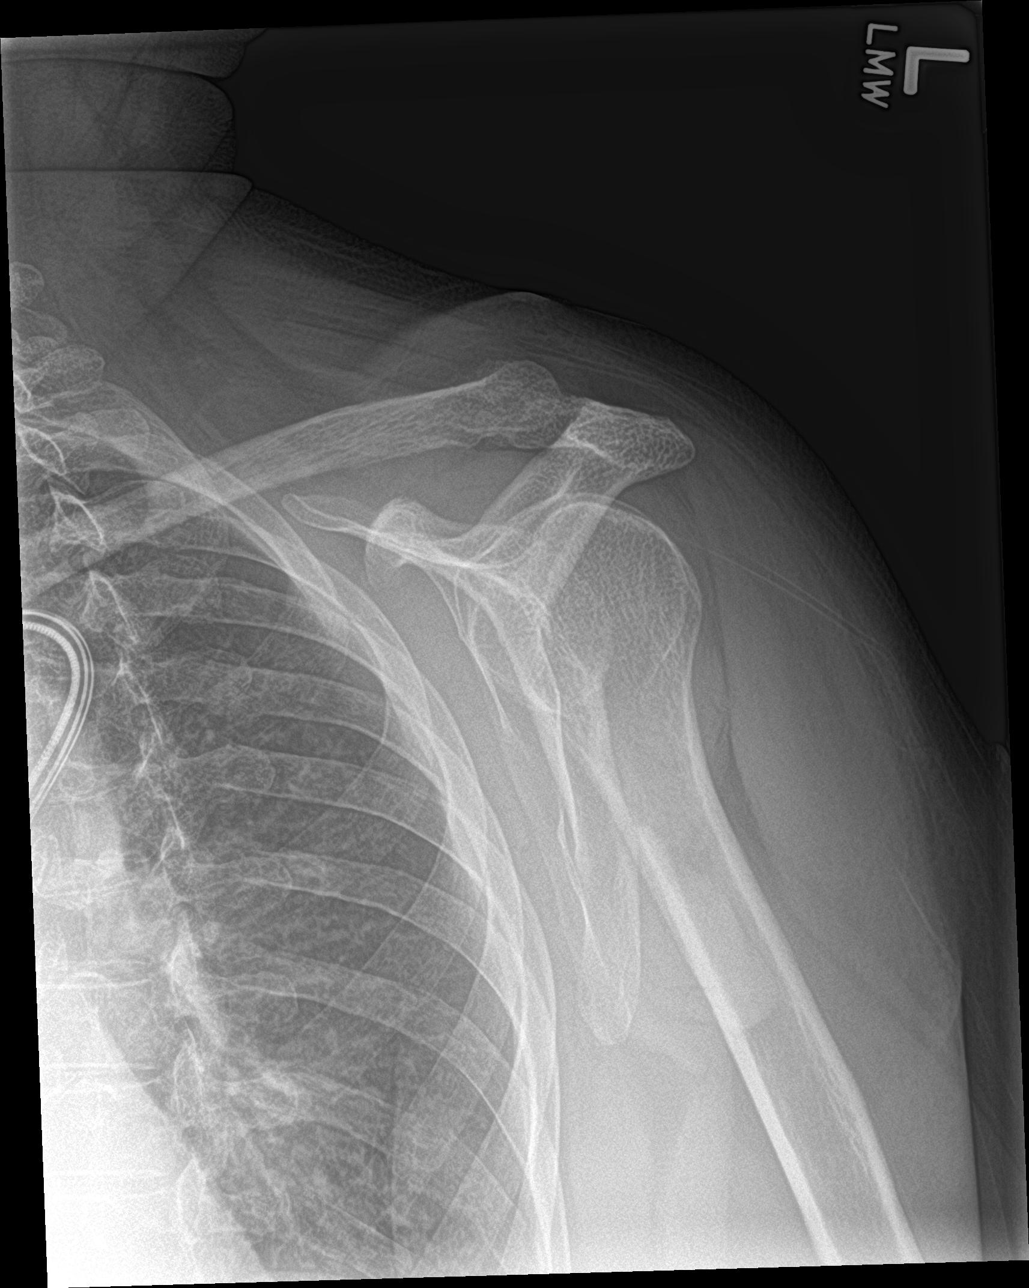

[shoulder axillary]
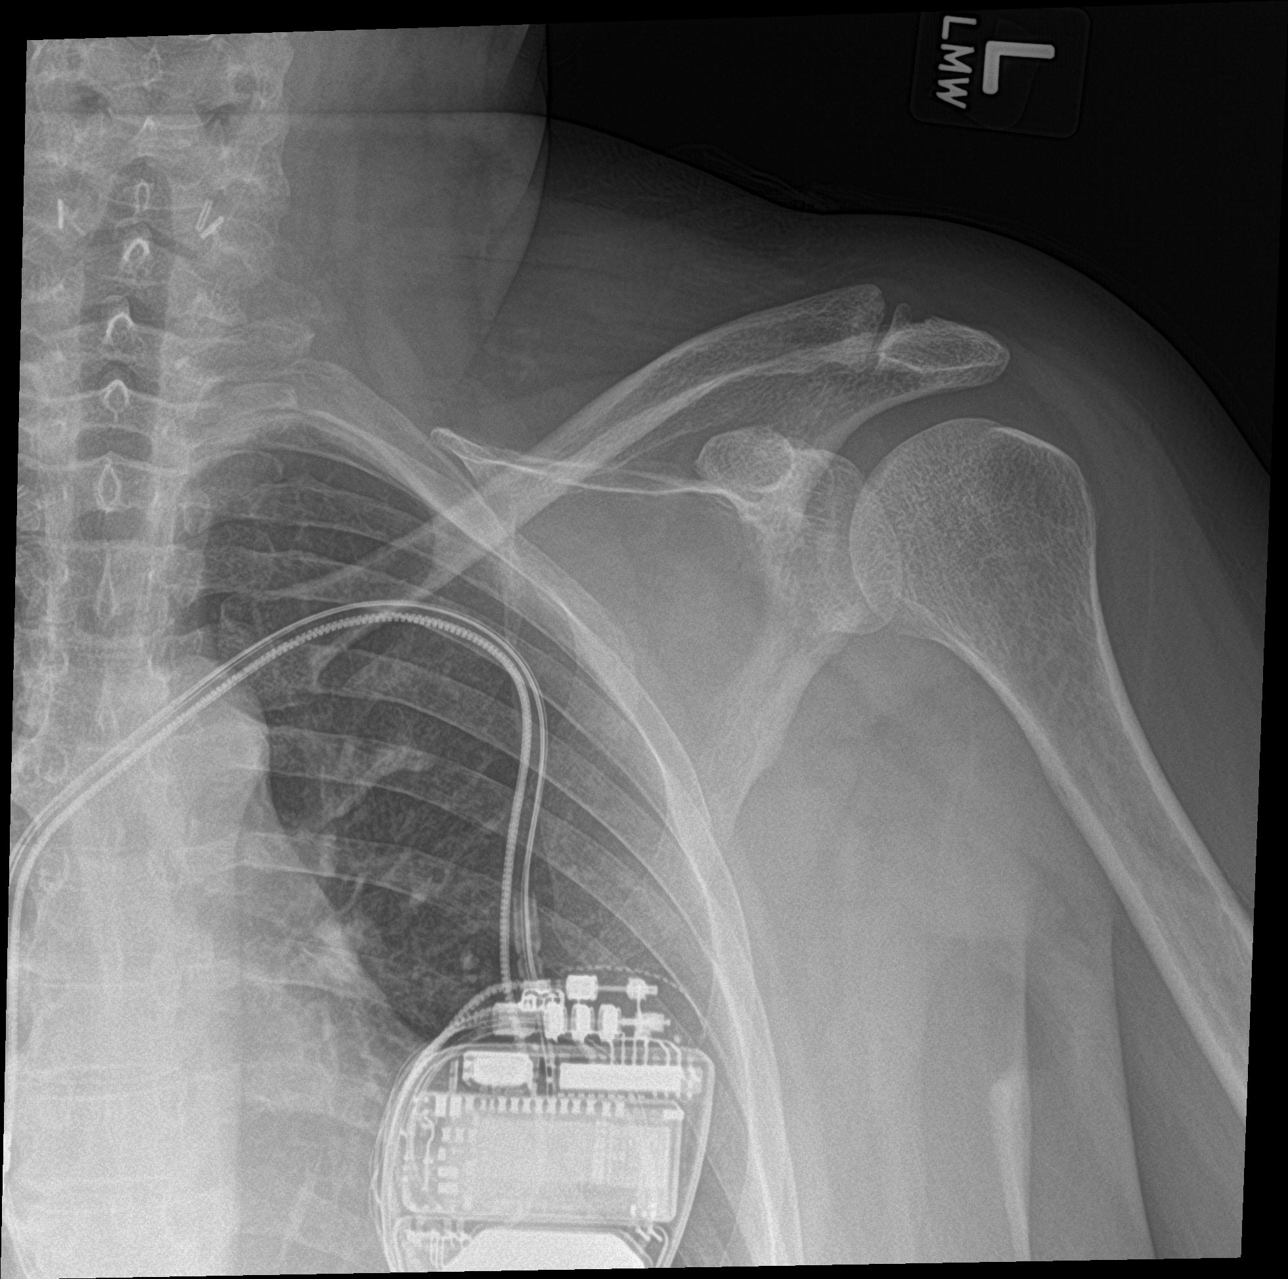

[3 of 3 positions shown; findings below may reference images not displayed]

FINDINGS: There is no evidence of fracture or dislocation. There is no
evidence of arthropathy or other focal bone abnormality. A multi
lead AICD is noted.
IMPRESSION: Negative.

## 2021-12-10 IMAGING — CR DG ELBOW 2V*L*
2 series · 2 of 2 positions shown · non-contrast
Comparison: None.

CLINICAL DATA: Status post fall.

EXAM:
LEFT ELBOW - 2 VIEW

[elbow ap]
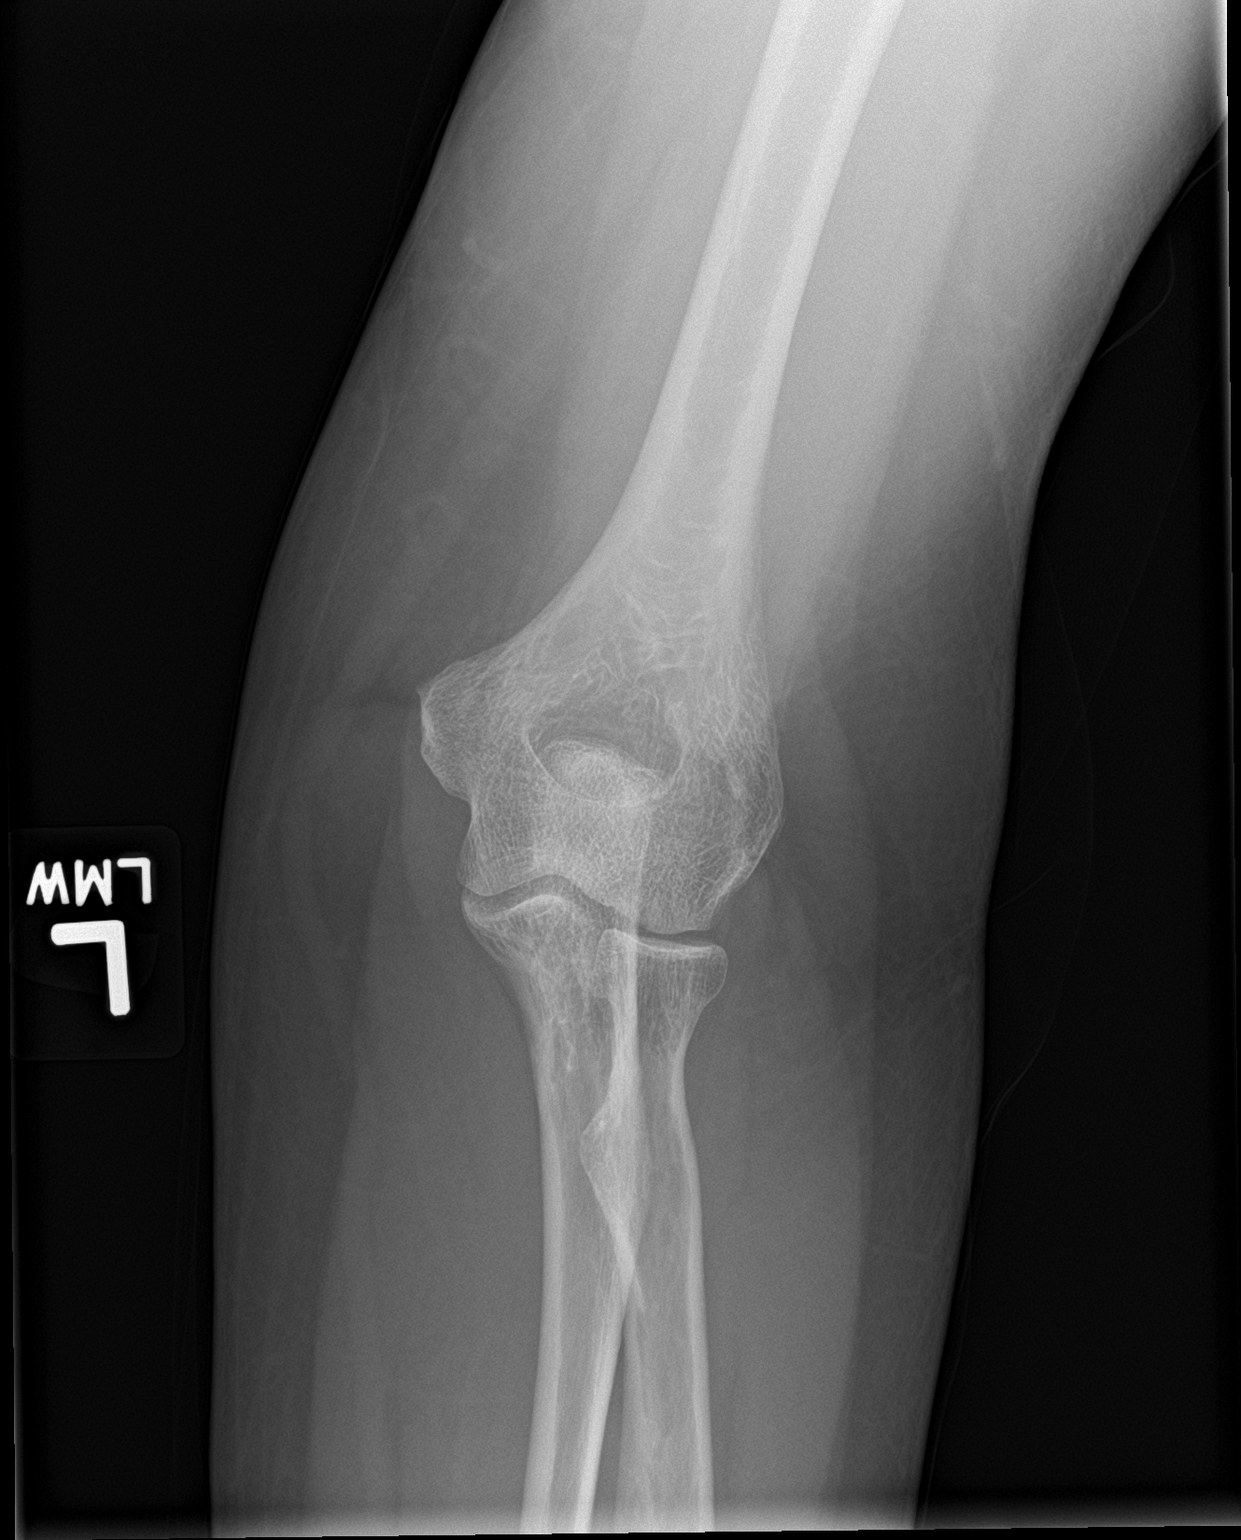

[elbow lat]
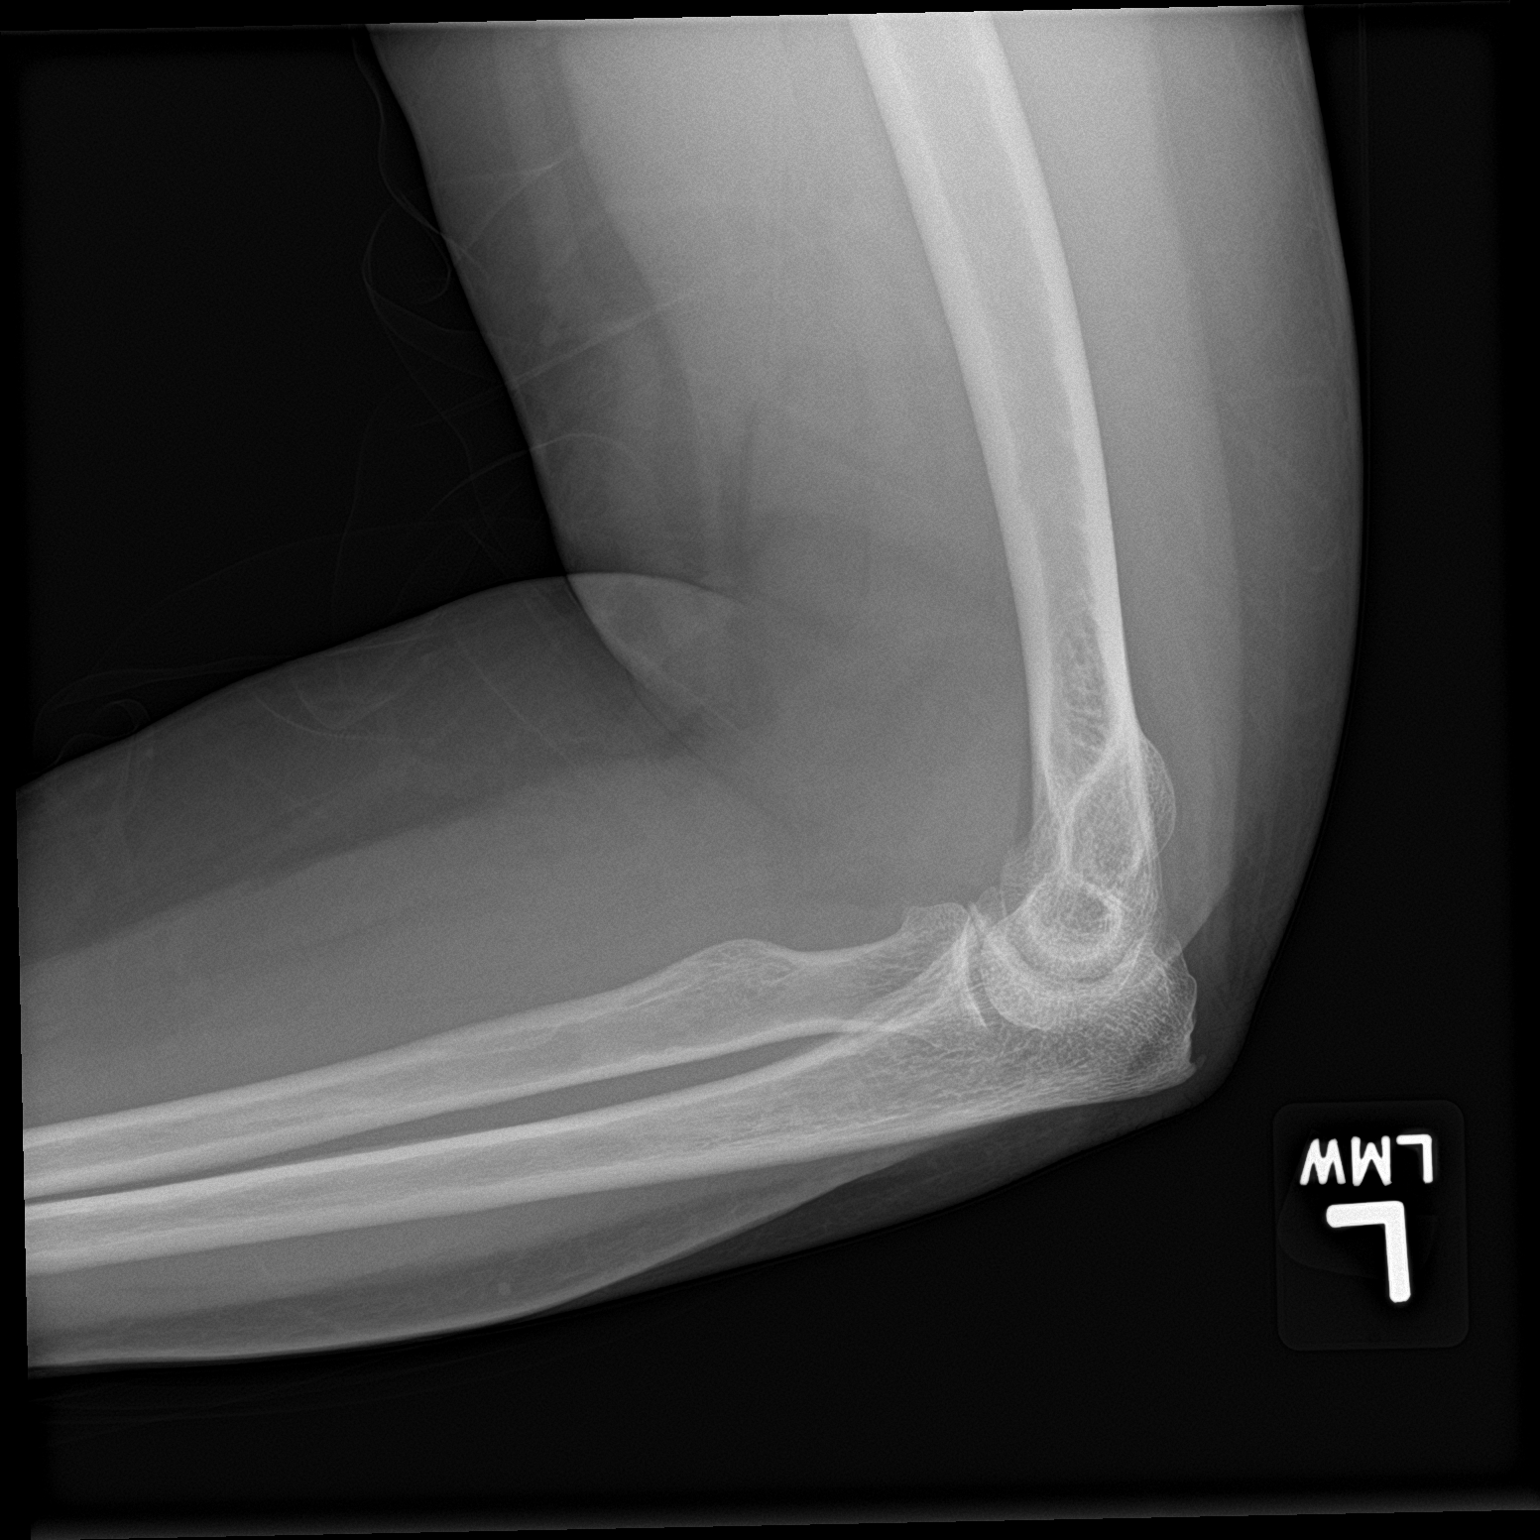

[2 of 2 positions shown; findings below may reference images not displayed]

FINDINGS: There is no evidence of fracture, dislocation, or joint effusion. A
very small bony spur is seen along the olecranon process. Soft
tissues are unremarkable.
IMPRESSION: No acute osseous abnormality.

## 2021-12-15 ENCOUNTER — Emergency Department: Payer: Medicare Other

## 2021-12-15 ENCOUNTER — Encounter: Payer: Self-pay | Admitting: Emergency Medicine

## 2021-12-15 ENCOUNTER — Other Ambulatory Visit: Payer: Self-pay

## 2021-12-15 ENCOUNTER — Observation Stay
Admission: EM | Admit: 2021-12-15 | Discharge: 2021-12-16 | Disposition: A | Discharge status: Home / Self Care | Payer: Medicare Other | Attending: Internal Medicine | Admitting: Internal Medicine

## 2021-12-15 DIAGNOSIS — E119 Type 2 diabetes mellitus without complications: Secondary | ICD-10-CM | POA: Diagnosis not present

## 2021-12-15 DIAGNOSIS — Z20822 Contact with and (suspected) exposure to covid-19: Secondary | ICD-10-CM | POA: Insufficient documentation

## 2021-12-15 DIAGNOSIS — E876 Hypokalemia: Secondary | ICD-10-CM | POA: Diagnosis not present

## 2021-12-15 DIAGNOSIS — R112 Nausea with vomiting, unspecified: Principal | ICD-10-CM | POA: Diagnosis present

## 2021-12-15 DIAGNOSIS — Z91040 Latex allergy status: Secondary | ICD-10-CM | POA: Diagnosis not present

## 2021-12-15 DIAGNOSIS — R0602 Shortness of breath: Secondary | ICD-10-CM | POA: Diagnosis not present

## 2021-12-15 DIAGNOSIS — J449 Chronic obstructive pulmonary disease, unspecified: Secondary | ICD-10-CM | POA: Insufficient documentation

## 2021-12-15 DIAGNOSIS — I5042 Chronic combined systolic (congestive) and diastolic (congestive) heart failure: Secondary | ICD-10-CM | POA: Diagnosis not present

## 2021-12-15 DIAGNOSIS — J45909 Unspecified asthma, uncomplicated: Secondary | ICD-10-CM | POA: Diagnosis not present

## 2021-12-15 DIAGNOSIS — E039 Hypothyroidism, unspecified: Secondary | ICD-10-CM | POA: Diagnosis present

## 2021-12-15 DIAGNOSIS — D3A8 Other benign neuroendocrine tumors: Secondary | ICD-10-CM | POA: Diagnosis not present

## 2021-12-15 DIAGNOSIS — Z7984 Long term (current) use of oral hypoglycemic drugs: Secondary | ICD-10-CM | POA: Insufficient documentation

## 2021-12-15 DIAGNOSIS — R197 Diarrhea, unspecified: Secondary | ICD-10-CM | POA: Insufficient documentation

## 2021-12-15 DIAGNOSIS — Z95810 Presence of automatic (implantable) cardiac defibrillator: Secondary | ICD-10-CM | POA: Diagnosis not present

## 2021-12-15 DIAGNOSIS — E890 Postprocedural hypothyroidism: Secondary | ICD-10-CM | POA: Diagnosis present

## 2021-12-15 DIAGNOSIS — I5022 Chronic systolic (congestive) heart failure: Secondary | ICD-10-CM | POA: Diagnosis present

## 2021-12-15 DIAGNOSIS — F419 Anxiety disorder, unspecified: Secondary | ICD-10-CM | POA: Diagnosis present

## 2021-12-15 DIAGNOSIS — G40909 Epilepsy, unspecified, not intractable, without status epilepticus: Secondary | ICD-10-CM

## 2021-12-15 LAB — CBC
HCT: 41.8 % (ref 36.0–46.0)
Hemoglobin: 13.7 g/dL (ref 12.0–15.0)
MCH: 28.7 pg (ref 26.0–34.0)
MCHC: 32.8 g/dL (ref 30.0–36.0)
MCV: 87.6 fL (ref 80.0–100.0)
Platelets: 258 10*3/uL (ref 150–400)
RBC: 4.77 MIL/uL (ref 3.87–5.11)
RDW: 13.8 % (ref 11.5–15.5)
WBC: 10.7 10*3/uL — ABNORMAL HIGH (ref 4.0–10.5)
nRBC: 0 % (ref 0.0–0.2)

## 2021-12-15 LAB — COMPREHENSIVE METABOLIC PANEL
ALT: 16 U/L (ref 0–44)
AST: 21 U/L (ref 15–41)
Albumin: 4.1 g/dL (ref 3.5–5.0)
Alkaline Phosphatase: 89 U/L (ref 38–126)
Anion gap: 8 (ref 5–15)
BUN: 9 mg/dL (ref 6–20)
CO2: 31 mmol/L (ref 22–32)
Calcium: 8.4 mg/dL — ABNORMAL LOW (ref 8.9–10.3)
Chloride: 104 mmol/L (ref 98–111)
Creatinine, Ser: 0.95 mg/dL (ref 0.44–1.00)
GFR, Estimated: >60 mL/min (ref >60)
Glucose, Bld: 130 mg/dL — ABNORMAL HIGH (ref 70–99)
Potassium: 2.6 mmol/L — CL (ref 3.5–5.1)
Sodium: 143 mmol/L (ref 135–145)
Total Bilirubin: 0.6 mg/dL (ref 0.3–1.2)
Total Protein: 7.8 g/dL (ref 6.5–8.1)

## 2021-12-15 LAB — URINALYSIS, COMPLETE (UACMP) WITH MICROSCOPIC
Bilirubin Urine: NEGATIVE
Glucose, UA: NEGATIVE mg/dL
Hgb urine dipstick: NEGATIVE
Ketones, ur: NEGATIVE mg/dL
Nitrite: NEGATIVE
Protein, ur: 30 mg/dL — AB
Specific Gravity, Urine: 1.034 — ABNORMAL HIGH (ref 1.005–1.030)
pH: 5 (ref 5.0–8.0)

## 2021-12-15 LAB — GLUCOSE, CAPILLARY
Glucose-Capillary: 162 mg/dL — ABNORMAL HIGH (ref 70–99)
Glucose-Capillary: 87 mg/dL (ref 70–99)

## 2021-12-15 LAB — MAGNESIUM: Magnesium: 1.9 mg/dL (ref 1.7–2.4)

## 2021-12-15 LAB — BASIC METABOLIC PANEL
Anion gap: 6 (ref 5–15)
BUN: 11 mg/dL (ref 6–20)
CO2: 26 mmol/L (ref 22–32)
Calcium: 7.7 mg/dL — ABNORMAL LOW (ref 8.9–10.3)
Chloride: 107 mmol/L (ref 98–111)
Creatinine, Ser: 0.81 mg/dL (ref 0.44–1.00)
GFR, Estimated: >60 mL/min (ref >60)
Glucose, Bld: 114 mg/dL — ABNORMAL HIGH (ref 70–99)
Potassium: 3.7 mmol/L (ref 3.5–5.1)
Sodium: 139 mmol/L (ref 135–145)

## 2021-12-15 LAB — RESP PANEL BY RT-PCR (FLU A&B, COVID) ARPGX2
Influenza A by PCR: NEGATIVE
Influenza B by PCR: NEGATIVE
SARS Coronavirus 2 by RT PCR: NEGATIVE

## 2021-12-15 LAB — LIPASE, BLOOD: Lipase: 30 U/L (ref 11–51)

## 2021-12-15 LAB — TROPONIN I (HIGH SENSITIVITY)
Troponin I (High Sensitivity): 5 ng/L (ref <18)
Troponin I (High Sensitivity): 4 ng/L (ref <18)

## 2021-12-15 LAB — D-DIMER, QUANTITATIVE: D-Dimer, Quant: 0.55 ug/mL-FEU — ABNORMAL HIGH (ref 0.00–0.50)

## 2021-12-15 MED ORDER — NITROGLYCERIN 0.4 MG SL SUBL: 0.4000 mg | SUBLINGUAL_TABLET | SUBLINGUAL | Status: DC | PRN

## 2021-12-15 MED ORDER — POTASSIUM CHLORIDE IN NACL 40-0.9 MEQ/L-% IV SOLN
INTRAVENOUS | Status: AC
  Filled 2021-12-15: qty 1000

## 2021-12-15 MED ORDER — POTASSIUM CHLORIDE CRYS ER 20 MEQ PO TBCR
20.0000 meq | EXTENDED_RELEASE_TABLET | Freq: Two times a day (BID) | ORAL | Status: DC
  Administered 2021-12-16: 20 meq via ORAL
  Filled 2021-12-15: qty 1

## 2021-12-15 MED ORDER — METOCLOPRAMIDE HCL 5 MG/ML IJ SOLN
10.0000 mg | Freq: Three times a day (TID) | INTRAMUSCULAR | Status: DC
  Administered 2021-12-15: 10 mg via INTRAVENOUS
  Filled 2021-12-15 (×2): qty 2

## 2021-12-15 MED ORDER — POTASSIUM CHLORIDE IN NACL 40-0.9 MEQ/L-% IV SOLN
INTRAVENOUS | Status: DC
Start: 2021-12-15 — End: 2021-12-15
  Filled 2021-12-15: qty 1000

## 2021-12-15 MED ORDER — LACOSAMIDE 50 MG PO TABS
100.0000 mg | ORAL_TABLET | Freq: Two times a day (BID) | ORAL | Status: DC
  Administered 2021-12-15 – 2021-12-16 (×3): 100 mg via ORAL
  Filled 2021-12-15 (×3): qty 2

## 2021-12-15 MED ORDER — METOCLOPRAMIDE HCL 5 MG/ML IJ SOLN
10.0000 mg | Freq: Once | INTRAMUSCULAR | Status: AC
  Administered 2021-12-15: 10 mg via INTRAVENOUS
  Filled 2021-12-15: qty 2

## 2021-12-15 MED ORDER — POTASSIUM CHLORIDE 10 MEQ/100ML IV SOLN
10.0000 meq | INTRAVENOUS | Status: AC
  Administered 2021-12-15 (×3): 10 meq via INTRAVENOUS
  Filled 2021-12-15 (×3): qty 100

## 2021-12-15 MED ORDER — CYCLOBENZAPRINE HCL 10 MG PO TABS: 5.0000 mg | ORAL_TABLET | Freq: Three times a day (TID) | ORAL | Status: DC | PRN

## 2021-12-15 MED ORDER — ACETAMINOPHEN 650 MG RE SUPP: 650.0000 mg | Freq: Four times a day (QID) | RECTAL | Status: DC | PRN

## 2021-12-15 MED ORDER — SODIUM CHLORIDE 0.9 % IV SOLN: Freq: Once | INTRAVENOUS | Status: DC

## 2021-12-15 MED ORDER — FENTANYL 75 MCG/HR TD PT72
1.0000 | MEDICATED_PATCH | TRANSDERMAL | Status: DC
  Administered 2021-12-16: 1 via TRANSDERMAL
  Filled 2021-12-15: qty 1

## 2021-12-15 MED ORDER — ENOXAPARIN SODIUM 40 MG/0.4ML IJ SOSY
40.0000 mg | PREFILLED_SYRINGE | INTRAMUSCULAR | Status: DC
  Administered 2021-12-15: 40 mg via SUBCUTANEOUS
  Filled 2021-12-15: qty 0.4

## 2021-12-15 MED ORDER — SODIUM CHLORIDE 0.9 % IV BOLUS
500.0000 mL | Freq: Once | INTRAVENOUS | Status: AC
  Administered 2021-12-15: 500 mL via INTRAVENOUS

## 2021-12-15 MED ORDER — POTASSIUM CHLORIDE CRYS ER 20 MEQ PO TBCR
40.0000 meq | EXTENDED_RELEASE_TABLET | Freq: Once | ORAL | Status: AC
  Administered 2021-12-15: 40 meq via ORAL
  Filled 2021-12-15: qty 2

## 2021-12-15 MED ORDER — GABAPENTIN 100 MG PO CAPS
100.0000 mg | ORAL_CAPSULE | Freq: Every day | ORAL | Status: DC
  Administered 2021-12-15: 100 mg via ORAL
  Filled 2021-12-15: qty 1

## 2021-12-15 MED ORDER — ROPINIROLE HCL 1 MG PO TABS
4.0000 mg | ORAL_TABLET | Freq: Every day | ORAL | Status: DC
  Administered 2021-12-15: 4 mg via ORAL
  Filled 2021-12-15: qty 4

## 2021-12-15 MED ORDER — LEVOTHYROXINE SODIUM 50 MCG PO TABS
200.0000 ug | ORAL_TABLET | Freq: Every day | ORAL | Status: DC
  Administered 2021-12-15 – 2021-12-16 (×2): 200 ug via ORAL
  Filled 2021-12-15: qty 4
  Filled 2021-12-15: qty 2

## 2021-12-15 MED ORDER — TECHNETIUM TO 99M ALBUMIN AGGREGATED
4.2500 | Freq: Once | INTRAVENOUS | Status: AC | PRN
  Administered 2021-12-15: 4.25 via INTRAVENOUS
  Filled 2021-12-15: qty 4.25

## 2021-12-15 MED ORDER — LORAZEPAM 1 MG PO TABS: 1.0000 mg | ORAL_TABLET | Freq: Four times a day (QID) | ORAL | Status: DC | PRN

## 2021-12-15 MED ORDER — ACETAMINOPHEN 325 MG PO TABS: 650.0000 mg | ORAL_TABLET | Freq: Four times a day (QID) | ORAL | Status: DC | PRN

## 2021-12-15 MED ORDER — MORPHINE SULFATE (PF) 4 MG/ML IV SOLN
4.0000 mg | INTRAVENOUS | Status: DC | PRN
  Administered 2021-12-15 – 2021-12-16 (×7): 4 mg via INTRAVENOUS
  Filled 2021-12-15 (×7): qty 1

## 2021-12-15 NOTE — Assessment & Plan Note (Signed)
Stable and not acutely exacerbated ?Hold Lasix for now due to relative hypotension and significant hypokalemia ?Hold Cozaar, spironolactone and metoprolol and resume once patient is able to tolerate oral intake ?

## 2021-12-15 NOTE — Assessment & Plan Note (Addendum)
Most likely secondary to GI losses from nausea/ vomiting/diarrhea as well as concomitant diuretic use. ?Hold Lasix for now ?Supplement potassium ?

## 2021-12-15 NOTE — Assessment & Plan Note (Signed)
Stable Continue Synthroid 

## 2021-12-15 NOTE — Assessment & Plan Note (Signed)
Patient is currently on a clear liquid diet ?Check blood sugars with meals ?

## 2021-12-15 NOTE — Assessment & Plan Note (Signed)
Place patient on seizure precautions ?Continue lacosamide ?

## 2021-12-15 NOTE — ED Notes (Signed)
Informed RN bed assigned 

## 2021-12-15 NOTE — Assessment & Plan Note (Signed)
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

## 2021-12-15 NOTE — Assessment & Plan Note (Signed)
Continue as needed lorazepam 

## 2021-12-15 NOTE — Plan of Care (Signed)

## 2021-12-15 NOTE — ED Triage Notes (Signed)
Pt via POV from home. Pt c/o SOB, generalized body aches, and vomiting. Pt was tested for COVID and flu last Monday and it was negative. Pt has a hx of CHF but denies any new swelling at this time. Pt also has a hx of A fib. Pt is A&OX4 and NAD.  ?

## 2021-12-15 NOTE — ED Provider Notes (Signed)
? ?[facility] ?Provider Note ? ? ? Event Date/Time  ? First MD Initiated Contact with Patient 12/15/21 [phone]   ?  (approximate) ? ? ?History  ? ?Shortness of Breath ? ? ?HPI ? ?Brittany Nguyen is a 56 y.o. female history of gastric neuroendocrine tumor status post biopsy last week presents to the ER for evaluation of shortness of breath epigastric pain nausea vomiting over the past 24 hours.  Also has a history of CHF but has been compliant with her medications.  States it hurts to take deep breath.  Does have history of DVT she states but not currently on any blood thinners.  No measured fevers.  No dysuria.  Pain is crampy in nature nonradiating.  Patient has extensive allergy list including allergy to contrast. ?  ? ? ?Physical Exam  ? ?Triage Vital Signs: ?ED Triage Vitals  ?Enc Vitals Group  ?   BP 12/15/21 0709 120/75  ?   Pulse Rate 12/15/21 0711 92  ?   Resp 12/15/21 0709 20  ?   Temp 12/15/21 0709 98 ?F (36.7 ?C)  ?   Temp Source 12/15/21 0709 Oral  ?   SpO2 12/15/21 0709 98 %  ?   Weight 12/15/21 0707 184 lb (83.5 kg)  ?   Height 12/15/21 0707 '5\' 5"'$  (1.651 m)  ?   Head Circumference --   ?   Peak Flow --   ?   Pain Score 12/15/21 0707 8  ?   Pain Loc --   ?   Pain Edu? --   ?   Excl. in [place]? --   ? ? ?Most recent vital signs: ?Vitals:  ? 12/15/21 0709 12/15/21 0711  ?BP: 120/75   ?Pulse:  92  ?Resp: 20   ?Temp: 98 ?F (36.7 ?C)   ?SpO2: 98%   ? ? ? ?Constitutional: Alert  ?Eyes: Conjunctivae are normal.  ?Head: Atraumatic. ?Nose: No congestion/rhinnorhea. ?Mouth/Throat: Mucous membranes are moist.   ?Neck: Painless ROM.  ?Cardiovascular:   Good peripheral circulation. No m/g/r ?Respiratory: Normal respiratory effort.  No retractions. No crackles or wheeze ?Gastrointestinal: Soft with epigastric ttp, no guarding or rebound ?Musculoskeletal:  no deformity ?Neurologic:  MAE spontaneously. No gross focal neurologic deficits are appreciated.  ?Skin:  Skin is warm, dry and intact. No rash  noted. ?Psychiatric: Mood and affect are normal. Speech and behavior are normal. ? ? ? ?ED Results / Procedures / Treatments  ? ?Labs ?(all labs ordered are listed, but only abnormal results are displayed) ?Labs Reviewed  ?URINALYSIS, COMPLETE (UACMP) WITH MICROSCOPIC - Abnormal; Notable for the following components:  ?    Result Value  ? Color, Urine AMBER (*)   ? APPearance CLOUDY (*)   ? Specific Gravity, Urine 1.034 (*)   ? Protein, ur 30 (*)   ? Leukocytes,Ua MODERATE (*)   ? Bacteria, UA FEW (*)   ? All other components within normal limits  ?CBC - Abnormal; Notable for the following components:  ? WBC 10.7 (*)   ? All other components within normal limits  ?COMPREHENSIVE METABOLIC PANEL - Abnormal; Notable for the following components:  ? Potassium 2.6 (*)   ? Glucose, Bld 130 (*)   ? Calcium 8.4 (*)   ? All other components within normal limits  ?D-DIMER, QUANTITATIVE - Abnormal; Notable for the following components:  ? D-Dimer, Quant 0.55 (*)   ? All other components within normal limits  ?RESP PANEL BY RT-PCR (FLU A&B,  COVID) [id number]  ?GASTROINTESTINAL PANEL BY PCR, STOOL (REPLACES STOOL CULTURE)  ?LIPASE, BLOOD  ?MAGNESIUM  ?TROPONIN I (HIGH SENSITIVITY)  ?TROPONIN I (HIGH SENSITIVITY)  ? ? ? ?EKG ? ?ED ECG REPORT ?I, Merlyn Lot, the attending physician, personally viewed and interpreted this ECG. ? ? Date: 12/15/2021 ? EKG Time: 7:11 ? Rate: 90 ? Rhythm: sinus ? Axis: normal ? Intervals:normal ? ST&T Change: occasional pvc, no stemi ? ? ? ?RADIOLOGY ?Please see ED Course for my review and interpretation. ? ?I personally reviewed all radiographic images ordered to evaluate for the above acute complaints and reviewed radiology reports and findings.  These findings were personally discussed with the patient.  Please see medical record for radiology report. ? ? ? ?PROCEDURES: ? ?Critical Care performed: yes ? ?.Critical Care ?Performed by: Merlyn Lot, MD ?Authorized by: Merlyn Lot, MD   ? ?Critical care provider statement:  ?  Critical care time (minutes):  34 ?  Critical care was necessary to treat or prevent imminent or life-threatening deterioration of the following conditions:  Metabolic crisis ?  Critical care was time spent personally by me on the following activities:  Ordering and performing treatments and interventions, ordering and review of laboratory studies, ordering and review of radiographic studies, pulse oximetry, re-evaluation of patient's condition, review of old charts, obtaining history from patient or surrogate, examination of patient, evaluation of patient's response to treatment, discussions with primary provider, discussions with consultants and development of treatment plan with patient or surrogate ? ? ?MEDICATIONS ORDERED IN ED: ?Medications  ?morphine (PF) 4 MG/ML injection 4 mg (4 mg Intravenous Given 12/15/21 0738)  ?potassium chloride 10 mEq in 100 mL IVPB (10 mEq Intravenous New Bag/Given 12/15/21 0906)  ?metoCLOPramide (REGLAN) injection 10 mg (10 mg Intravenous Given 12/15/21 0737)  ?sodium chloride 0.9 % bolus 500 mL (500 mLs Intravenous New Bag/Given 12/15/21 0747)  ?potassium chloride SA (KLOR-CON M) CR tablet 40 mEq (40 mEq Oral Given 12/15/21 0918)  ? ? ? ?IMPRESSION / MDM / ASSESSMENT AND PLAN / ED COURSE  ?I reviewed the triage vital signs and the nursing notes. ?             ?               ? ?Differential diagnosis includes, but is not limited to, electrolyte abnormality, dehydration, enteritis, gastritis, perforation, SBO, PE, pneumonia, CHF, ACS ? ?Patient presenting to the ER with symptoms as described above.  Unwell appearing but hemodynamically stable not febrile.  Does have tenderness in epigastric region on exam status post recent biopsy.  Will check blood work will order imaging for the above differential.  Will give IV morphine as well as Reglan. ? ? ?Clinical Course as of 12/15/21 1024  ?Wed Dec 15, 2021  ?0807 My review and interpretation of CT  abdomen pelvis do not see any evidence of free air perforation, no sign of small bowel obstruction..  Will await formal radiology report [PR]  ?5956 Potassium is critically low 2.6.  We will replete with IV and oral potassium CT abdomen does not show any acute abnormality. [PR]  ?1021 Patient now having episodes of diarrhea persistent nausea no longer actively vomiting after receiving Reglan.  Patient still feels weak and short of breath likely secondary to symptomatic hypokalemia.  She states that she has required hospitalization in the past for hypokalemia.  She is already on to potassium supplements twice daily and has been compliant with his medications and is still hypokalemic to  2.6 therefore I do feel she will require hospitalization for additional potassium runs in reassessment.  Will consult hospitalist for admission. [PR]  ?  ?Clinical Course User Index ?[PR] Merlyn Lot, MD  ? ? ? ?FINAL CLINICAL IMPRESSION(S) / ED DIAGNOSES  ? ?Final diagnoses:  ?Hypokalemia  ?Nausea vomiting and diarrhea  ? ? ? ?Rx / DC Orders  ? ?ED Discharge Orders   ? ? None  ? ?  ? ? ? ?Note:  This document was prepared using Dragon voice recognition software and may include unintentional dictation errors. ? ?  ?Merlyn Lot, MD ?12/15/21 1024 ? ?

## 2021-12-15 NOTE — ED Notes (Signed)
Pt taken to [facility] via stretcher   ?

## 2021-12-15 NOTE — Assessment & Plan Note (Signed)
Patient presents for refractory nausea and vomiting ?Supportive care with IV PPI, antiemetics, judicious IV fluid resuscitation ?Trial of clears ?

## 2021-12-15 NOTE — H&P (Signed)
?History and Physical  ? ? ?Patient: Brittany Nguyen FBP:[id number] DOB: 1966/04/17 ?DOA: 12/15/2021 ?DOS: the patient was seen and examined on 12/15/2021 ?PCP: [name], MD  ?Patient coming from: Home ? ?Chief Complaint:  ?Chief Complaint  ?Patient presents with  ? Shortness of Breath  ? ?HPI: Brittany Nguyen is a 56 y.o. female with medical history significant for gastric neuroendocrine tumor, chronic combined systolic and diastolic dysfunction CHF(last known LVEF of 35 - 40% from a 2D echocardiogram which was done 10/21, nonischemic cardiomyopathy status post AICD placement, obstructive sleep apnea, hypothyroidism, history of GERD, history of seizures, obesity who presents to the emergency room for evaluation of body aches, nausea, vomiting and diarrhea for 2 days. ?She denies having any sick contacts and denies having any fever or chills. ?She also complains of shortness of breath mostly when she takes a deep breath as well as abdominal pain which is unchanged. ?She complains of feeling very weak and has been unable to tolerate any oral intake. ?She denies having any chest pain, no cough, no dizziness, no lightheadedness, no leg swelling, no urinary symptoms, no headache, no focal deficit or blurred vision. ? ?Review of Systems: As mentioned in the history of present illness. All other systems reviewed and are negative. ?Past Medical History:  ?Diagnosis Date  ? Asthma 2013  ? Atrial fibrillation ([place])   ? C. difficile colitis 01/14/2018  ? 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 CHF (congestive heart failure) ([place])   ? COPD (chronic obstructive pulmonary disease) ([place])   ? Diverticulitis 2010  ? DVT (deep venous thrombosis) ([place])   ? Endometriosis 1990  ? GIB (gastrointestinal bleeding) 01/08/2018  ? Heart disease 2013  ? Hx MRSA infection   ? Iron deficiency   ? Iron deficiency anemia 03/27/2018  ? Lump or mass in breast   ? Restless leg   ? Seizures ([place])   ? Stomach cancer ([place])   ? ?Past  Surgical History:  ?Procedure Laterality Date  ? ABDOMINAL HYSTERECTOMY    ? age 89  ? BREAST BIOPSY Right 2014  ? benign  ? CARDIAC DEFIBRILLATOR PLACEMENT    ? CARDIAC DEFIBRILLATOR PLACEMENT    ? CESAREAN SECTION    ? CHOLECYSTECTOMY    ? COLECTOMY    ? COLONOSCOPY WITH PROPOFOL N/A 02/22/2018  ? Procedure: COLONOSCOPY WITH PROPOFOL;  Surgeon: Toledo, Benay Pike, MD;  Location: [facility] ENDOSCOPY;  Service: Gastroenterology;  Laterality: N/A;  ? ESOPHAGOGASTRODUODENOSCOPY (EGD) WITH PROPOFOL N/A 02/22/2018  ? Procedure: ESOPHAGOGASTRODUODENOSCOPY (EGD) WITH PROPOFOL;  Surgeon: Toledo, Benay Pike, MD;  Location: [facility] ENDOSCOPY;  Service: Gastroenterology;  Laterality: N/A;  ? NASAL SINUS SURGERY  2012  ? OOPHORECTOMY    ? ?Social History:  reports that she has never smoked. She has never used smokeless tobacco. She reports current drug use. She reports that she does not drink alcohol. ? ?Allergies  ?Allergen Reactions  ? Contrast Media [Iodinated Contrast Media] Shortness Of Breath  ?  Per CT report in 2014 pt had break through contrast reaction with hives and SOB following 13 hour prep. MSY   ? Lidocaine Hives  ? Metrizamide Shortness Of Breath  ? Penicillins Hives and Other (See Comments)  ?  Other reaction(s): Other (See Comments) ?Has patient had a PCN reaction causing immediate rash, facial/tongue/throat swelling, SOB or lightheadedness with hypotension: Yes ?Has patient had a PCN reaction causing severe rash involving mucus membranes or skin necrosis: No ?Has patient had a PCN reaction  that required hospitalization No ?Has patient had a PCN reaction occurring within the last 10 years: No ?If all of the above answers are "NO", then may proceed with Cephalosporin use. ?Has patient had a PCN reaction causing immediate rash, facial/tongue/throat swelling, SOB or lightheadedness with hypotension: Yes ?Has patient had a PCN reaction causing severe rash involving mucus membranes or skin necrosis: No ?Has patient had a PCN  reaction that required hospitalization No ?Has patient had a PCN reaction occurring within the last 10 years: No ?If all of the above answers are "NO", then may proceed with Cephalosporin use. ? ?ediate rash, facial/tongue/throat swelling, SOB or lightheadedness with hypotension, tachy  ? [place]   ?  Other reaction(s): Other (See Comments) ?Sz (unclear if new start entresto vs hypoglycemia)  ? Isosorbide Nitrate   ?  Other reaction(s): Headache  ? Latex Hives  ? Ondansetron Other (See Comments)  ?  Severe headache  ? Zofran [Ondansetron Hcl] Other (See Comments)  ?  Causes migraines  ? Povidone-Iodine Rash  ?  Blistering rash  ? Pulmicort [Budesonide] Itching  ? ? ?Family History  ?Problem Relation Age of Onset  ? Cancer Mother 55  ?     ovarian  ? CAD Mother   ? Cancer Father 27  ?     brain  ? CAD Father   ? Cancer Daughter 18  ?     skin  ? Cancer Maternal Aunt 34  ?     breast  ? Leukemia Paternal Grandfather   ? ? ?Prior to Admission medications   ?Medication Sig Start Date End Date Taking? Authorizing Provider  ?cyclobenzaprine (FLEXERIL) 5 MG tablet Take 5 mg by mouth 3 (three) times daily as needed. 07/01/21   [provider]  ?fentaNYL (DURAGESIC) 50 MCG/HR Place 1 patch onto the skin every 3 (three) days. 10/08/18   Epifanio Lesches, MD  ?furosemide (LASIX) 40 MG tablet Take 80 mg by mouth 2 (two) times daily.  07/09/18   [provider]  ?gabapentin (NEURONTIN) 100 MG capsule Take 100 mg by mouth at bedtime. 06/01/21   [provider]  ?lacosamide 100 MG TABS Take 1 tablet (100 mg total) by mouth 2 (two) times daily. 11/10/21   Lorella Nimrod, MD  ?levothyroxine (SYNTHROID) 200 MCG tablet Take 200 mcg by mouth daily. 04/15/21   [provider]  ?LORazepam (ATIVAN) 1 MG tablet Take 1 tablet (1 mg total) by mouth 4 (four) times daily as needed. 11/10/21   Lorella Nimrod, MD  ?losartan (COZAAR) 25 MG tablet Take 25 mg by mouth daily.     [provider]   ?metoprolol succinate (TOPROL-XL) 25 MG 24 hr tablet Take 1 tablet (25 mg total) by mouth 2 (two) times daily. 11/10/21   Lorella Nimrod, MD  ?morphine (MSIR) 15 MG tablet Take 15 mg by mouth as needed for severe pain.     [provider]  ?oxyCODONE-acetaminophen (PERCOCET/ROXICET) 5-325 MG tablet Take 1-2 tablets by mouth every 4 (four) hours as needed. 01/17/20   [provider]  ?potassium chloride SA (KLOR-CON M20) 20 MEQ tablet Take 1 tablet (20 mEq total) by mouth 2 (two) times daily for 7 days. 07/09/19 11/08/21  Carrie Mew, MD  ?rOPINIRole (REQUIP) 4 MG tablet Take 4 mg by mouth at bedtime. 01/30/19   [provider]  ?spironolactone (ALDACTONE) 50 MG tablet Take 50 mg by mouth daily. 11/26/18   [provider]  ? ? ?Physical Exam: ?Vitals:  ?  12/15/21 0707 12/15/21 0709 12/15/21 0711 12/15/21 1032  ?BP:  120/75  106/70  ?Pulse:   92 87  ?Resp:  20  18  ?Temp:  98 ?F (36.7 ?C)  97.9 ?F (36.6 ?C)  ?TempSrc:  Oral  Oral  ?SpO2:  98%  99%  ?Weight: 83.5 kg     ?Height: '5\' 5"'$  (1.651 m)     ? ?Physical Exam ?Vitals and nursing note reviewed.  ?Constitutional:   ?   Appearance: She is well-developed. She is obese.  ?HENT:  ?   Head: Normocephalic and atraumatic.  ?   Mouth/Throat:  ?   Mouth: Mucous membranes are moist.  ?Eyes:  ?   Pupils: Pupils are equal, round, and reactive to light.  ?Cardiovascular:  ?   Rate and Rhythm: Normal rate and regular rhythm.  ?Pulmonary:  ?   Effort: Pulmonary effort is normal.  ?   Breath sounds: Normal breath sounds.  ?Abdominal:  ?   General: Bowel sounds are normal.  ?   Palpations: Abdomen is soft.  ?Musculoskeletal:     ?   General: Normal range of motion.  ?   Cervical back: Normal range of motion and neck supple.  ?Skin: ?   General: Skin is warm and dry.  ?Neurological:  ?   General: No focal deficit present.  ?   Mental Status: She is alert and oriented to person, place, and time.  ?Psychiatric:     ?   Mood and Affect: Mood normal.      ?   Behavior: Behavior normal.  ? ? ?Data Reviewed: ?Relevant notes from primary care and specialist visits, past discharge summaries as available in EHR, including Care Everywhere. ?Prior diagnostic t

## 2021-12-15 NOTE — ED Notes (Signed)
42 yof with a c/c of body aches and vomiting for 2 days.  ?

## 2021-12-16 DIAGNOSIS — R112 Nausea with vomiting, unspecified: Secondary | ICD-10-CM | POA: Diagnosis not present

## 2021-12-16 LAB — BASIC METABOLIC PANEL
Anion gap: 6 (ref 5–15)
BUN: 13 mg/dL (ref 6–20)
CO2: 27 mmol/L (ref 22–32)
Calcium: 7.6 mg/dL — ABNORMAL LOW (ref 8.9–10.3)
Chloride: 108 mmol/L (ref 98–111)
Creatinine, Ser: 0.8 mg/dL (ref 0.44–1.00)
GFR, Estimated: >60 mL/min (ref >60)
Glucose, Bld: 96 mg/dL (ref 70–99)
Potassium: 3.8 mmol/L (ref 3.5–5.1)
Sodium: 141 mmol/L (ref 135–145)

## 2021-12-16 LAB — CBC
HCT: 35.6 % — ABNORMAL LOW (ref 36.0–46.0)
Hemoglobin: 11.8 g/dL — ABNORMAL LOW (ref 12.0–15.0)
MCH: 29.1 pg (ref 26.0–34.0)
MCHC: 33.1 g/dL (ref 30.0–36.0)
MCV: 87.7 fL (ref 80.0–100.0)
Platelets: 183 10*3/uL (ref 150–400)
RBC: 4.06 MIL/uL (ref 3.87–5.11)
RDW: 13.8 % (ref 11.5–15.5)
WBC: 7 10*3/uL (ref 4.0–10.5)
nRBC: 0 % (ref 0.0–0.2)

## 2021-12-16 LAB — GLUCOSE, CAPILLARY
Glucose-Capillary: 110 mg/dL — ABNORMAL HIGH (ref 70–99)
Glucose-Capillary: 104 mg/dL — ABNORMAL HIGH (ref 70–99)

## 2021-12-16 MED ORDER — PROMETHAZINE HCL 12.5 MG RE SUPP: 12.5000 mg | Freq: Every day | RECTAL | 0 refills | Status: DC | PRN

## 2021-12-16 MED ORDER — PROMETHAZINE HCL 25 MG RE SUPP
12.5000 mg | Freq: Every day | RECTAL | Status: DC | PRN
  Filled 2021-12-16: qty 1

## 2021-12-16 NOTE — Discharge Summary (Signed)
?Physician Discharge Summary ?  ?Patient: Brittany Nguyen MRN: [id number] DOB: Dec 26, 1965  ?Admit date:     12/15/2021  ?Discharge date: 12/16/21  ?Discharge Physician: Fritzi Mandes  ? ?PCP: [name], MD  ? ?Recommendations at discharge:  ? ?full liquid diet for now. Advance as tolerated ?follow-up with your gastroenterologist on your appointment ? ?Discharge Diagnoses: ?intractable nausea vomiting improved ?hyperkalemia resolved ? ?Hospital Course: ?Maisen Schmit is a 56 y.o. female with medical history significant for gastric neuroendocrine tumor, chronic combined systolic and diastolic dysfunction CHF(last known LVEF of 35 - 40% from a 2D echocardiogram which was done 10/21, nonischemic cardiomyopathy status post AICD placement, obstructive sleep apnea, hypothyroidism, history of GERD, history of seizures, obesity who presents to the emergency room for evaluation of body aches, nausea, vomiting and diarrhea for 2 days. ? ?Intractable nausea vomiting ?-- patient received supportive care with IV fluids PPI antiemetics ?-- tolerating clears ?-- no diarrhea ?-- advanced to full liquid and if tolerates will discharged to home after that-- patient agreeable ?-- PRN Phenergan ? 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 and [facility] ?-- she gets IV infusions Q monthly ? ?hypokalemia secondary to G.I. loss ?-- repleted ? ?type II diabetes without complication ?-- sugars stable ?-- continue diet control ? ?history of seizure disorder ?-- continue vimpat ? ?overall hemodynamically stable. Will discharged to home if tolerates full liquid diet. Patient is in agreement with plan ? ? ?  ? ? ?Disposition: Home ?Diet recommendation:  ?Discharge Diet Orders (From admission, onward)  ? ?  Start     Ordered  ? 12/16/21 0000  Diet - low sodium heart healthy       ? 12/16/21 1008  ? ?  ?  ? ?  ? ?Full liquid diet ?DISCHARGE MEDICATION: ?Allergies as of 12/16/2021    ? ?   Reactions  ? Contrast Media [iodinated Contrast Media] Shortness Of Breath  ? Per CT report in 2014 pt had break through contrast reaction with hives and SOB following 13 hour prep. MSY   ? Lidocaine Hives  ? Metrizamide Shortness Of Breath  ? Penicillins Hives, Other (See Comments)  ? ediate rash, facial/tongue/throat swelling, SOB or lightheadedness with hypotension, tachy  ? [place]   ? Sz (unclear if new start entresto vs hypoglycemia)  ? Isosorbide Nitrate Other (See Comments)  ? Headache  ? Latex Hives  ? Ondansetron Other (See Comments)  ? Severe headache  ? Tizanidine Other (See Comments)  ? Feels altered  ? Povidone-iodine Rash  ? Blistering rash  ? Pulmicort [budesonide] Itching  ? ?  ? ?  ?Medication List  ?  ? ?TAKE these medications   ? ?cyclobenzaprine 5 MG tablet ?Commonly known as: FLEXERIL ?Take 5 mg by mouth 3 (three) times daily as needed for muscle spasms. ?  ?fentaNYL 50 MCG/HR ?Commonly known as: [place] ?Place 1 patch onto the skin every 3 (three) days. ?  ?furosemide 40 MG tablet ?Commonly known as: LASIX ?Take 80 mg by mouth 2 (two) times daily. ?Notes to patient: Start taking after 2-3 days ?  ?gabapentin 100 MG capsule ?Commonly known as: NEURONTIN ?Take 100 mg by mouth at bedtime. ?  ?Lacosamide 100 MG Tabs ?Take 1 tablet (100 mg total) by mouth 2 (two) times daily. ?  ?levothyroxine 200 MCG tablet ?Commonly known as: SYNTHROID ?Take 200 mcg by mouth daily before breakfast. ?  ?LORazepam 1 MG tablet ?Commonly known as:  ATIVAN ?Take 1 tablet (1 mg total) by mouth 4 (four) times daily as needed. ?What changed: reasons to take this ?  ?losartan 25 MG tablet ?Commonly known as: COZAAR ?Take 12.5 mg by mouth in the morning and at bedtime. ?Notes to patient: Hold if BP <120 ?  ?metoprolol succinate 25 MG 24 hr tablet ?Commonly known as: TOPROL-XL ?Take 1 tablet (25 mg total) by mouth 2 (two) times daily. ?Notes to patient: Hold if BP <120 ?  ?naloxone 4 MG/0.1ML Liqd  nasal spray kit ?Commonly known as: NARCAN ?Place 1 spray into the nose as needed for opioid reversal. May repeat once in opposite nare. ?  ?nitroGLYCERIN 0.4 MG SL tablet ?Commonly known as: NITROSTAT ?Place 0.4 mg under the tongue every 5 (five) minutes x 3 doses as needed for chest pain. ?  ?oxyCODONE-acetaminophen 5-325 MG tablet ?Commonly known as: PERCOCET/ROXICET ?Take 1-2 tablets by mouth every 4 (four) hours as needed (breakthrough pain). ?  ?Potassium Chloride ER 20 MEQ Tbcr ?Take 20 mEq by mouth 2 (two) times daily. ?  ?promethazine 25 MG tablet ?Commonly known as: PHENERGAN ?Take 25 mg by mouth every 6 (six) hours as needed for nausea/vomiting. ?What changed: Another medication with the same name was added. Make sure you understand how and when to take each. ?  ?promethazine 12.5 MG suppository ?Commonly known as: PHENERGAN ?Place 1 suppository (12.5 mg total) rectally daily as needed for nausea or vomiting. ?What changed: You were already taking a medication with the same name, and this prescription was added. Make sure you understand how and when to take each. ?  ?rOPINIRole 4 MG tablet ?Commonly known as: REQUIP ?Take 4 mg by mouth at bedtime. ?  ?spironolactone 50 MG tablet ?Commonly known as: ALDACTONE ?Take 50 mg by mouth daily. ?  ? ?  ? ? ?Discharge Exam: ?Filed Weights  ? 12/15/21 0707  ?Weight: 83.5 kg  ? ? ? ?Condition at discharge: fair ? ?The results of significant diagnostics from this hospitalization (including imaging, microbiology, ancillary and laboratory) are listed below for reference.  ? ?Imaging Studies: ?CT ABDOMEN PELVIS WO CONTRAST ? ?Result Date: 12/15/2021 ?CLINICAL DATA:  Body aches and vomiting, recent endoscopy EXAM: CT ABDOMEN AND PELVIS WITHOUT CONTRAST TECHNIQUE: Multidetector CT imaging of the abdomen and pelvis was performed following the standard protocol without IV contrast. RADIATION DOSE REDUCTION: This exam was performed according to the departmental  dose-optimization program which includes automated exposure control, adjustment of the mA and/or kV according to patient size and/or use of iterative reconstruction technique. COMPARISON:  CT abdomen and pelvis dated June 22, 2021 FINDINGS: Lower chest: Visualized lungs are clear. Partially visualized pacer lead with tip in the right atrium. Hepatobiliary: No focal liver abnormality is seen. Status post cholecystectomy. No biliary dilatation. Pancreas: Unremarkable. No pancreatic ductal dilatation or surrounding inflammatory changes. Spleen: Normal in size without focal abnormality. Adrenals/Urinary Tract: Adrenal glands are unremarkable. Kidneys are normal, without renal calculi, focal lesion, or hydronephrosis. Bladder is decompressed. Stomach/Bowel: Incidental note is made of a lipoma in the gastric body, stomach is otherwise unremarkable. Prior partial sigmoid colon resection. Small duodenal diverticulum. Appendix not visualized, although there are no secondary findings of acute appendicitis. No bowel wall thickening, inflammatory change or evidence of obstruction. Vascular/Lymphatic: No significant vascular findings are present. No enlarged abdominal or pelvic lymph nodes. Reproductive: Status post hysterectomy. No adnexal masses. Other: No abdominal wall hernia or abnormality. No abdominopelvic ascites. Musculoskeletal: No acute or significant osseous findings. IMPRESSION: No acute  findings in the abdomen or pelvis. Electronically Signed   By: Yetta Glassman M.D.   On: 12/15/2021 08:24  ? ?DG Chest 2 View ? ?Result Date: 12/15/2021 ?CLINICAL DATA:  56 year old female with shortness of breath, evaluate for pneumonia. EXAM: CHEST - 2 VIEW COMPARISON:  09/27/2021 FINDINGS: The mediastinal contours are within normal limits. No cardiomegaly. Unchanged position of indwelling left chest wall, left subclavian vein approach dual lead pacemaker/AICD. The lungs are clear bilaterally without evidence of focal  consolidation, pleural effusion, or pneumothorax. Cholecystectomy clips in the right upper quadrant. No acute osseous abnormality. IMPRESSION: No acute cardiopulmonary process. Electronically Signed   By: Glade Nurse.D.

## 2021-12-16 NOTE — TOC Initial Note (Signed)
Transition of Care (TOC) - Initial/Assessment Note  ? ? ?Patient Details  ?Name: Brittany Nguyen ?MRN: [id number] ?Date of Birth: Sep 14, 1965 ? ?Transition of Care (TOC) CM/SW Contact:    ?Conception Oms, RN ?Phone Number: ?12/16/2021, 10:20 AM ? ?Clinical Narrative:                 ? ? ?Transition of Care (TOC) Screening Note ? ? ?Patient Details  ?Name: Brittany Nguyen ?Date of Birth: 1965-12-04 ? ? ?Transition of Care (TOC) CM/SW Contact:    ?Conception Oms, RN ?Phone Number: ?12/16/2021, 10:20 AM ? ? ? ?Transition of Care Department [facility]) has reviewed patient and no TOC needs have been identified at this time. We will continue to monitor patient advancement through interdisciplinary progression rounds. If new patient transition needs arise, please place a TOC consult. ?  ?  ?  ? ? ?Patient Goals and CMS Choice ?  ?  ?  ? ?Expected Discharge Plan and Services ?  ?  ?  ?  ?  ?Expected Discharge Date: 12/16/21               ?  ?  ?  ?  ?  ?  ?  ?  ?  ?  ? ?Prior Living Arrangements/Services ?  ?  ?  ?       ?  ?  ?  ?  ? ?Activities of Daily Living ?Home Assistive Devices/Equipment: None ?ADL Screening (condition at time of admission) ?Patient's cognitive ability adequate to safely complete daily activities?: Yes ?Is the patient deaf or have difficulty hearing?: No ?Does the patient have difficulty seeing, even when wearing glasses/contacts?: No ?Does the patient have difficulty concentrating, remembering, or making decisions?: No ?Patient able to express need for assistance with ADLs?: Yes ?Does the patient have difficulty dressing or bathing?: No ?Independently performs ADLs?: Yes (appropriate for developmental age) ?Does the patient have difficulty walking or climbing stairs?: No ?Weakness of Legs: None ?Weakness of Arms/Hands: None ? ?Permission Sought/Granted ?  ?  ?   ?   ?   ?   ? ?Emotional Assessment ?  ?  ?  ?  ?  ?  ? ?Admission diagnosis:  Hypokalemia [E87.6] ?Nausea vomiting and diarrhea  [R11.2, R19.7] ?Refractory nausea and vomiting [R11.2] ?Patient Active Problem List  ? Diagnosis Date Noted  ? Refractory nausea and vomiting 12/15/2021  ? Migraine 11/09/2021  ? Neuropathy 11/09/2021  ? Anxiety 11/09/2021  ? At risk for prolonged QT interval syndrome - borderline long QT on EKG 11/07/21 in ED 11/07/2021  ? Seizure disorder ([place]) 06/22/2021  ? Chronic diarrhea 06/22/2021  ? Non-ischemic cardiomyopathy ([place]) 01/23/2020  ? 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/23/2020  ? Breakthrough seizure ([place]) 01/23/2020  ? Post-surgical hypothyroidism 01/23/2020  ? Chronic use of opiate for therapeutic purpose 01/23/2020  ? Type 2 diabetes mellitus without complication ([place]) 19/37/9024  ? Neuroendocrine neoplasm of stomach 10/19/2018  ? Iron deficiency anemia 03/27/2018  ? Unstable angina (HCC)   ? Encounter for anticoagulation discussion and counseling   ? COPD (chronic obstructive pulmonary disease) ([place]) 04/26/2017  ? Chronic combined systolic and diastolic CHF (congestive heart failure) ([place]) 02/03/2017  ? Hypotension 02/03/2017  ? RLS (restless legs syndrome) 12/09/2016  ? Hypokalemia 11/22/2015  ? Hx MRSA infection   ? Lump or mass in breast   ? Asthma 2013  ? GOITER, MULTINODULAR 01/20/2009  ? HYPOGLYCEMIA, UNSPECIFIED 01/20/2009  ? GERD 01/20/2009  ?  DIVERTICULITIS OF COLON 01/20/2009  ? ?PCP:  [name], MD ?Pharmacy:   ?[id number] [address] ?[address] ?[place] [address] ?Phone: [phone] Fax: [phone] ? ?[place], [place] ?[address] ?[place] [address] ?Phone: [phone] Fax: [phone] ? ? ? ? ?Social Determinants of Health (SDOH) Interventions ?  ? ?Readmission Risk Interventions ?   ? View : No data to display.  ?  ?  ?  ? ? ? ?

## 2021-12-16 NOTE — Discharge Instructions (Signed)
Continue full liquid diet ?now and advances tolerated PO ?follow-up with you gastroenterologist on your scheduled appointment. ?

## 2021-12-16 NOTE — Plan of Care (Signed)
?  Problem: Clinical Measurements: ?Goal: Ability to maintain clinical measurements within normal limits will improve ?Outcome: Progressing ?Goal: Will remain free from infection ?Outcome: Progressing ?Goal: Diagnostic test results will improve ?Outcome: Progressing ?Goal: Cardiovascular complication will be avoided ?Outcome: Progressing ?  ?Problem: Nutrition: ?Goal: Adequate nutrition will be maintained ?Outcome: Progressing ?  ?Problem: Elimination: ?Goal: Will not experience complications related to bowel motility ?Outcome: Progressing ?  ?Problem: Pain Managment: ?Goal: General experience of comfort will improve ?Outcome: Progressing ?  ?Problem: Education: ?Goal: Knowledge of General Education information will improve ?Description: Including pain rating scale, medication(s)/side effects and non-pharmacologic comfort measures ?Outcome: Completed/Met ?  ?Problem: Health Behavior/Discharge Planning: ?Goal: Ability to manage health-related needs will improve ?Outcome: Completed/Met ?  ?Problem: Clinical Measurements: ?Goal: Respiratory complications will improve ?Outcome: Completed/Met ?  ?Problem: Activity: ?Goal: Risk for activity intolerance will decrease ?Outcome: Completed/Met ?  ?Problem: Coping: ?Goal: Level of anxiety will decrease ?Outcome: Completed/Met ?  ?Problem: Elimination: ?Goal: Will not experience complications related to urinary retention ?Outcome: Completed/Met ?  ?Problem: Safety: ?Goal: Ability to remain free from injury will improve ?Outcome: Completed/Met ?  ?Problem: Skin Integrity: ?Goal: Risk for impaired skin integrity will decrease ?Outcome: Completed/Met ?  ?

## 2022-01-16 ENCOUNTER — Observation Stay
Admission: EM | Admit: 2022-01-16 | Discharge: 2022-01-17 | Disposition: A | Discharge status: Home / Self Care | Payer: Medicare Other | Attending: Internal Medicine | Admitting: Internal Medicine

## 2022-01-16 ENCOUNTER — Emergency Department: Payer: Medicare Other

## 2022-01-16 DIAGNOSIS — J4520 Mild intermittent asthma, uncomplicated: Secondary | ICD-10-CM | POA: Diagnosis not present

## 2022-01-16 DIAGNOSIS — Z85028 Personal history of other malignant neoplasm of stomach: Secondary | ICD-10-CM | POA: Diagnosis not present

## 2022-01-16 DIAGNOSIS — E119 Type 2 diabetes mellitus without complications: Secondary | ICD-10-CM

## 2022-01-16 DIAGNOSIS — J449 Chronic obstructive pulmonary disease, unspecified: Secondary | ICD-10-CM | POA: Diagnosis not present

## 2022-01-16 DIAGNOSIS — E876 Hypokalemia: Secondary | ICD-10-CM | POA: Diagnosis present

## 2022-01-16 DIAGNOSIS — I4891 Unspecified atrial fibrillation: Secondary | ICD-10-CM | POA: Insufficient documentation

## 2022-01-16 DIAGNOSIS — G40919 Epilepsy, unspecified, intractable, without status epilepticus: Secondary | ICD-10-CM | POA: Diagnosis present

## 2022-01-16 DIAGNOSIS — E039 Hypothyroidism, unspecified: Secondary | ICD-10-CM | POA: Diagnosis present

## 2022-01-16 DIAGNOSIS — R569 Unspecified convulsions: Secondary | ICD-10-CM | POA: Diagnosis not present

## 2022-01-16 DIAGNOSIS — I110 Hypertensive heart disease with heart failure: Secondary | ICD-10-CM | POA: Insufficient documentation

## 2022-01-16 DIAGNOSIS — Z79899 Other long term (current) drug therapy: Secondary | ICD-10-CM | POA: Insufficient documentation

## 2022-01-16 DIAGNOSIS — Z91040 Latex allergy status: Secondary | ICD-10-CM | POA: Diagnosis not present

## 2022-01-16 DIAGNOSIS — Z86718 Personal history of other venous thrombosis and embolism: Secondary | ICD-10-CM | POA: Diagnosis not present

## 2022-01-16 DIAGNOSIS — I959 Hypotension, unspecified: Secondary | ICD-10-CM | POA: Diagnosis not present

## 2022-01-16 DIAGNOSIS — D3A8 Other benign neuroendocrine tumors: Secondary | ICD-10-CM | POA: Diagnosis present

## 2022-01-16 DIAGNOSIS — E890 Postprocedural hypothyroidism: Secondary | ICD-10-CM | POA: Diagnosis present

## 2022-01-16 DIAGNOSIS — I5042 Chronic combined systolic (congestive) and diastolic (congestive) heart failure: Secondary | ICD-10-CM | POA: Diagnosis not present

## 2022-01-16 DIAGNOSIS — Z95810 Presence of automatic (implantable) cardiac defibrillator: Secondary | ICD-10-CM | POA: Diagnosis present

## 2022-01-16 DIAGNOSIS — I5022 Chronic systolic (congestive) heart failure: Secondary | ICD-10-CM | POA: Diagnosis present

## 2022-01-16 DIAGNOSIS — I428 Other cardiomyopathies: Secondary | ICD-10-CM

## 2022-01-16 LAB — CBC WITH DIFFERENTIAL/PLATELET
Abs Immature Granulocytes: 0.07 10*3/uL (ref 0.00–0.07)
Basophils Absolute: 0.1 10*3/uL (ref 0.0–0.1)
Basophils Relative: 0 %
Eosinophils Absolute: 0.2 10*3/uL (ref 0.0–0.5)
Eosinophils Relative: 2 %
HCT: 42.2 % (ref 36.0–46.0)
Hemoglobin: 14.1 g/dL (ref 12.0–15.0)
Immature Granulocytes: 1 %
Lymphocytes Relative: 43 %
Lymphs Abs: 5.8 10*3/uL — ABNORMAL HIGH (ref 0.7–4.0)
MCH: 29.5 pg (ref 26.0–34.0)
MCHC: 33.4 g/dL (ref 30.0–36.0)
MCV: 88.3 fL (ref 80.0–100.0)
Monocytes Absolute: 0.9 10*3/uL (ref 0.1–1.0)
Monocytes Relative: 7 %
Neutro Abs: 6.4 10*3/uL (ref 1.7–7.7)
Neutrophils Relative %: 47 %
Platelets: 284 10*3/uL (ref 150–400)
RBC: 4.78 MIL/uL (ref 3.87–5.11)
RDW: 13.2 % (ref 11.5–15.5)
Smear Review: NORMAL
WBC: 13.5 10*3/uL — ABNORMAL HIGH (ref 4.0–10.5)
nRBC: 0 % (ref 0.0–0.2)

## 2022-01-16 LAB — COMPREHENSIVE METABOLIC PANEL
ALT: 18 U/L (ref 0–44)
AST: 23 U/L (ref 15–41)
Albumin: 4.2 g/dL (ref 3.5–5.0)
Alkaline Phosphatase: 94 U/L (ref 38–126)
Anion gap: 10 (ref 5–15)
BUN: 14 mg/dL (ref 6–20)
CO2: 31 mmol/L (ref 22–32)
Calcium: 8.5 mg/dL — ABNORMAL LOW (ref 8.9–10.3)
Chloride: 98 mmol/L (ref 98–111)
Creatinine, Ser: 0.95 mg/dL (ref 0.44–1.00)
GFR, Estimated: >60 mL/min (ref >60)
Glucose, Bld: 120 mg/dL — ABNORMAL HIGH (ref 70–99)
Potassium: 2.6 mmol/L — CL (ref 3.5–5.1)
Sodium: 139 mmol/L (ref 135–145)
Total Bilirubin: 0.3 mg/dL (ref 0.3–1.2)
Total Protein: 8 g/dL (ref 6.5–8.1)

## 2022-01-16 LAB — PROCALCITONIN: Procalcitonin: <0.1 ng/mL

## 2022-01-16 LAB — TROPONIN I (HIGH SENSITIVITY): Troponin I (High Sensitivity): 5 ng/L (ref <18)

## 2022-01-16 MED ORDER — ACETAMINOPHEN 500 MG PO TABS
1000.0000 mg | ORAL_TABLET | Freq: Once | ORAL | Status: AC
  Administered 2022-01-16: 1000 mg via ORAL
  Filled 2022-01-16: qty 2

## 2022-01-16 MED ORDER — LEVOTHYROXINE SODIUM 175 MCG PO TABS
175.0000 ug | ORAL_TABLET | Freq: Every day | ORAL | Status: DC
  Administered 2022-01-17: 175 ug via ORAL
  Filled 2022-01-16: qty 1

## 2022-01-16 MED ORDER — SODIUM CHLORIDE 0.9 % IV SOLN
150.0000 mg | Freq: Once | INTRAVENOUS | Status: AC
  Administered 2022-01-16: 150 mg via INTRAVENOUS
  Filled 2022-01-16: qty 15

## 2022-01-16 MED ORDER — KETOROLAC TROMETHAMINE 30 MG/ML IJ SOLN
15.0000 mg | Freq: Once | INTRAMUSCULAR | Status: AC
  Administered 2022-01-16: 15 mg via INTRAVENOUS
  Filled 2022-01-16: qty 1

## 2022-01-16 MED ORDER — LACTATED RINGERS IV BOLUS
1000.0000 mL | Freq: Once | INTRAVENOUS | Status: AC
  Administered 2022-01-16: 1000 mL via INTRAVENOUS

## 2022-01-16 MED ORDER — LORAZEPAM 2 MG/ML IJ SOLN
2.0000 mg | Freq: Once | INTRAMUSCULAR | Status: AC
Start: 2022-01-16 — End: 2022-01-16
  Administered 2022-01-16: 2 mg via INTRAMUSCULAR

## 2022-01-16 MED ORDER — LORAZEPAM 2 MG/ML IJ SOLN: 2.0000 mg | INTRAMUSCULAR | Status: DC | PRN

## 2022-01-16 MED ORDER — ENOXAPARIN SODIUM 40 MG/0.4ML IJ SOSY
40.0000 mg | PREFILLED_SYRINGE | INTRAMUSCULAR | Status: DC
  Administered 2022-01-16: 40 mg via SUBCUTANEOUS
  Filled 2022-01-16: qty 0.4

## 2022-01-16 MED ORDER — LORAZEPAM 1 MG PO TABS: 1.0000 mg | ORAL_TABLET | Freq: Four times a day (QID) | ORAL | Status: DC | PRN

## 2022-01-16 MED ORDER — POTASSIUM CHLORIDE CRYS ER 20 MEQ PO TBCR
40.0000 meq | EXTENDED_RELEASE_TABLET | Freq: Once | ORAL | Status: AC
Start: 2022-01-16 — End: 2022-01-16
  Administered 2022-01-16: 40 meq via ORAL
  Filled 2022-01-16: qty 2

## 2022-01-16 MED ORDER — POTASSIUM CHLORIDE 10 MEQ/100ML IV SOLN
10.0000 meq | INTRAVENOUS | Status: AC
  Administered 2022-01-16 (×2): 10 meq via INTRAVENOUS
  Filled 2022-01-16: qty 100

## 2022-01-16 MED ORDER — ACETAMINOPHEN 325 MG PO TABS
650.0000 mg | ORAL_TABLET | ORAL | Status: DC | PRN
  Administered 2022-01-17: 650 mg via ORAL
  Filled 2022-01-16: qty 2

## 2022-01-16 MED ORDER — MAGNESIUM SULFATE 2 GM/50ML IV SOLN
2.0000 g | Freq: Once | INTRAVENOUS | Status: AC
  Administered 2022-01-16: 2 g via INTRAVENOUS
  Filled 2022-01-16: qty 50

## 2022-01-16 MED ORDER — PROMETHAZINE HCL 25 MG RE SUPP
12.5000 mg | Freq: Every day | RECTAL | Status: DC | PRN
  Filled 2022-01-16: qty 1

## 2022-01-16 MED ORDER — LACOSAMIDE 50 MG PO TABS
100.0000 mg | ORAL_TABLET | Freq: Two times a day (BID) | ORAL | Status: DC
Start: 2022-01-16 — End: 2022-01-17
  Administered 2022-01-16 – 2022-01-17 (×2): 100 mg via ORAL
  Filled 2022-01-16 (×2): qty 2

## 2022-01-16 MED ORDER — OXYCODONE-ACETAMINOPHEN 5-325 MG PO TABS: 1.0000 | ORAL_TABLET | ORAL | Status: DC | PRN

## 2022-01-16 MED ORDER — PROMETHAZINE HCL 25 MG PO TABS: 25.0000 mg | ORAL_TABLET | Freq: Four times a day (QID) | ORAL | Status: DC | PRN

## 2022-01-16 MED ORDER — ACETAMINOPHEN 325 MG RE SUPP: 650.0000 mg | RECTAL | Status: DC | PRN

## 2022-01-16 NOTE — ED Notes (Signed)
MD aware of pt vital signs ?

## 2022-01-16 NOTE — Assessment & Plan Note (Addendum)
Persistent hypotension in the ED with maps in the 50s to low 60s, improving with IV fluids ?Suspect related to CHF meds,.  Patient also has frequent episodes of nausea and vomiting related to neuroendocrine tumor so could be dehydrated ?IV fluids to keep MAP over 65 ?Hold the spironolactone, metoprolol, losartan and furosemide tonight and continue to monitor blood pressure.  Resume meds when appropriate ? ?

## 2022-01-16 NOTE — ED Notes (Signed)
CBG 129 

## 2022-01-16 NOTE — ED Notes (Signed)
Pt is now alert and able to respond verbally. Complaint of slight headache. Husband at the bedside.  ?

## 2022-01-16 NOTE — ED Notes (Addendum)
Called lab to ask why no labs are in progress as they were sent down at 1145. Lab hadn't started them yet.  ?

## 2022-01-16 NOTE — ED Notes (Signed)
Pt is still "twitching" and skaking all over but not as bad as initially. Pt still not verbal at this time.  ?

## 2022-01-16 NOTE — Discharge Instructions (Signed)
Please continue to take your Vimpat/lacosamide seizure medicine every day. ? ?Follow-up with your PCP and your neurologist, reach out to them this week to schedule an appointment in the clinic. ? ?Return to the ED with any worsening symptoms in the meantime. ?

## 2022-01-16 NOTE — Assessment & Plan Note (Addendum)
Was hospitalized in March for breakthrough seizures at which time Keppra was switched to Vimpat 100 twice daily and patient is compliant ?Neurology consult for additional recommendations ?

## 2022-01-16 NOTE — ED Notes (Signed)
Notified MD of pt's continued low BP; awaiting orders. Pt remains asymptomatic.  ?

## 2022-01-16 NOTE — ED Notes (Signed)
Notified MD Of pt's low blood pressure, awaiting orders. MD at bedside.  ?

## 2022-01-16 NOTE — ED Provider Notes (Signed)
? ?[facility] ?Provider Note ? ? ? Event Date/Time  ? First MD Initiated Contact with Patient 01/16/22 1143   ?  (approximate) ? ? ?History  ? ?Seizures ? ? ?HPI ? ?Brittany Nguyen is a 56 y.o. female who presents to the ED for evaluation of Seizures ?  ?I reviewed medical DC summary from 4/13.  History of gastric neuroendocrine tumor, reduced ejection fraction, AICD in place, obesity and OSA, seizure disorder on Vimpat. ?I reviewed DC summary from 3/8 where she was admitted medically for breakthrough seizure in her home Keppra was switched to Vimpat then.  Has been on Vimpat 1 mg twice daily since that time. ? ?Patient presents to the ED for evaluation of seizure-like activity.  Initially, husband phis majority of history at the bedside as patient is tremulous and not conversing.  He reports that he "knew it was coming on" because she seemed more tired and "out of it" for much of the morning. ? ?He got in the car and was bring her to the ED when she started shaking all over.  She presents to the ED by POV straight back to her room with seizure-like activity.  [place] provided 2 mg of intramuscular Ativan prior to my evaluation. ? ?By time I see here, she is sitting up in bed, visually tracking me and phonating single word responses.  Follows commands all 4 extremities. ? ?Physical Exam  ? ?Triage Vital Signs: ?ED Triage Vitals  ?Enc Vitals Group  ?   BP 01/16/22 1140 (!) 114/51  ?   Pulse Rate 01/16/22 1140 90  ?   Resp 01/16/22 1140 18  ?   Temp 01/16/22 1141 98.8 ?F (37.1 ?C)  ?   Temp Source 01/16/22 1141 Oral  ?   SpO2 01/16/22 1140 100 %  ?   Weight --   ?   Height --   ?   Head Circumference --   ?   Peak Flow --   ?   Pain Score 01/16/22 1140 0  ?   Pain Loc --   ?   Pain Edu? --   ?   Excl. in [place]? --   ? ? ?Most recent vital signs: ?Vitals:  ? 01/16/22 1400 01/16/22 1430  ?BP: (!) 99/59 97/63  ?Pulse: 63 (!) 58  ?Resp: 12 (!) 9  ?Temp:    ?SpO2: 98% 100%  ? ? ?General: Initially  lethargic, rapidly improving to awake, alert and oriented.  Follows commands all 4 extremities.  Eventually conversational and looks well ?CV:  Good peripheral perfusion.  ?Resp:  Normal effort.  ?Abd:  No distention.  Soft and benign ?MSK:  No deformity noted.  No signs of falls or trauma ?Neuro:  No focal deficits appreciated. Cranial nerves II through XII intact ?5/5 strength and sensation in all 4 extremities ?Other:   ? ? ?ED Results / Procedures / Treatments  ? ?Labs ?(all labs ordered are listed, but only abnormal results are displayed) ?Labs Reviewed  ?CBC WITH DIFFERENTIAL/PLATELET - Abnormal; Notable for the following components:  ?    Result Value  ? WBC 13.5 (*)   ? Lymphs Abs 5.8 (*)   ? All other components within normal limits  ?COMPREHENSIVE METABOLIC PANEL - Abnormal; Notable for the following components:  ? Potassium 2.6 (*)   ? Glucose, Bld 120 (*)   ? Calcium 8.5 (*)   ? All other components within normal limits  ?LACOSAMIDE  ?CBG MONITORING, ED  ? ? ?  EKG ?Sinus rhythm, wandering baseline and poor quality EKG, rate of 85 bpm.  Normal axis.  Left bundle morphology.  No STEMI. ? ?RADIOLOGY ? ? ?Official radiology report(s): ?No results found. ? ?PROCEDURES and INTERVENTIONS: ? ?.1-3 Lead EKG Interpretation ?Performed by: Vladimir Crofts, MD ?Authorized by: Vladimir Crofts, MD  ? ?  Interpretation: normal   ?  ECG rate:  66 ?  ECG rate assessment: normal   ?  Rhythm: sinus rhythm   ?  Ectopy: none   ?  Conduction: normal   ? ?Medications  ?potassium chloride 10 mEq in 100 mL IVPB (10 mEq Intravenous New Bag/Given 01/16/22 1512)  ?lacosamide (VIMPAT) 150 mg in sodium chloride 0.9 % 25 mL IVPB (0 mg Intravenous Stopped 01/16/22 1323)  ?LORazepam (ATIVAN) injection 2 mg (2 mg Intramuscular Given 01/16/22 1153)  ?potassium chloride SA (KLOR-CON M) CR tablet 40 mEq (40 mEq Oral Given 01/16/22 1419)  ?ketorolac (TORADOL) 30 MG/ML injection 15 mg (15 mg Intravenous Given 01/16/22 1509)  ?acetaminophen (TYLENOL)  tablet 1,000 mg (1,000 mg Oral Given 01/16/22 1508)  ? ? ? ?IMPRESSION / MDM / ASSESSMENT AND PLAN / ED COURSE  ?I reviewed the triage vital signs and the nursing notes. ? ?56 year old female with known seizure disorder presents to the ED with witnessed seizure-like activity ultimately suitable for outpatient management with neurology follow-up.  She presents with generalized tremulousness and received Ativan prior to my evaluation.  By the time I see her, she has some mild and fine tremulousness to her bilateral hands, but follows commands in all 4 extremities without evidence of recurrence of any seizure activity.  She receives IV loading dose of her lacosamide and serum lacosamide level is pending at the time of this writing.  She reports compliance with her Vimpat.  Blood work demonstrates a nonspecific leukocytosis, but otherwise do not see signs of infection, sepsis or SIRS criteria.  Metabolic panel is noted to have hypokalemia, which is repleted orally and IV.  EKG is nonischemic without evidence of significant interval changes.  I considered observation admission for this patient, but she would prefer outpatient management considering it is Mother's Day and she has plans this evening. ? ?Later, when I reassessed her and get some additional story from the patient, she reports going to [facility] this morning and preparing for Mother's Day cookout that today without any issues.  Denies any falls or syncopal episodes.  She "does not know what happened." ? ?Clinical Course as of 01/16/22 1522  ?Sun Jan 16, 2022  ?1202 Reassessed.  Continues to be tremulous throughout.  Looks well.  She sits up in bed independently and answers my questions. [DS]  ?[place].  Continues to feel better.  We discussed the importance of adherence to her lacosamide as an outpatient.  Following up with her PCP for potassium recheck and return precautions for the ED. [DS]  ?1518 We discussed plan of care and patient would like to go  home for Mother's Day to be there for her children and they are planning to have a cookout.  We discussed p.o. challenge, reassessment and hopefully discharge home if her headache improves. [DS]  ?  ?Clinical Course User Index ?[DS] Vladimir Crofts, MD  ? ? ? ?FINAL CLINICAL IMPRESSION(S) / ED DIAGNOSES  ? ?Final diagnoses:  ?Seizure-like activity ([place])  ? ? ? ?Rx / DC Orders  ? ?ED Discharge Orders   ? ? None  ? ?  ? ? ? ?Note:  This document was  prepared using Systems analyst and may include unintentional dictation errors. ?  ?Vladimir Crofts, MD ?01/16/22 1522 ? ?

## 2022-01-16 NOTE — ED Triage Notes (Signed)
Pt comes pov with seizure. Actively seizing and has hx of same. Was taken straight back to room 9 and Dr Tamala Julian at bedside. Pt was given '2mg'$  IM ativan.  ?

## 2022-01-16 NOTE — Assessment & Plan Note (Addendum)
Euvolemic.  Holding losartan, metoprolol, spironolactone, furosemide due to low blood pressure ?Monitor for overload in view of IV hydration given to correct hypotension in the ED ?Daily weights with intake and output monitoring ?

## 2022-01-16 NOTE — ED Notes (Signed)
Pt now awake and able to follow commands ?

## 2022-01-16 NOTE — ED Provider Notes (Signed)
----------------------------------------- ?  9:04 PM on 01/16/2022 ?----------------------------------------- ?Initial plan was for discharge home following reassuring seizure work-up per Dr. Tamala Julian.  Unfortunately, patient developed low blood pressure with maps in the 50s on multiple checks.  She appears asymptomatic with this, denies chest pain, shortness of breath, lightheadedness, or dizziness.  Prior EKG was unremarkable and rhythm is unchanged on cardiac monitor.  She was given supplemental magnesium along with previously administered potassium.  While she does have a mild leukocytosis, procalcitonin was added on and negative.  Chest x-ray shows no evidence of infection and I doubt sepsis.  Troponin within normal limits and I also doubt ACS or PE.  Blood pressure now gradually improving following second liter of IV fluids, patient's blood pressure medications may be contributing and she has previously dealt with hypotension back in March during admission requiring stopping of her blood pressure medications.  She will require ongoing observation while blood pressure medicines are held, case discussed with hospitalist for admission. ?  ?Blake Divine, MD ?01/16/22 2106 ? ?

## 2022-01-16 NOTE — Assessment & Plan Note (Signed)
Sliding scale insulin coverage 

## 2022-01-16 NOTE — Assessment & Plan Note (Signed)
Continue levothyroxine 800 mg twice daily ?

## 2022-01-16 NOTE — H&P (Signed)
?History and Physical  ? ? ?Patient: Brittany Nguyen WFU:[id number] DOB: [birth year] ?DOA: 01/16/2022 ?DOS: the patient was seen and examined on 01/16/2022 ?PCP: [name], MD  ?Patient coming from: Home ? ?Chief Complaint:  ?Chief Complaint  ?Patient presents with  ? Seizures  ? ? ?HPI: Brittany Nguyen is a 56 y.o. female with medical history significant for Systolic CHF( 20-25% 42/7062) secondary to nonischemic cardiomyopathy s/p AICD, OSA on CPAP, mild intermittent asthma, gastric neuroendocrine tumor , chronic abdominal pain of multifactorial etiology on chronic opiates, chronic diarrhea , diabetes, surgical hypothyroidism,seizure disorder, switched from [place] to [place] in March 20, who presents to the ED with a breakthrough seizure.  She was loaded with Lamictal and was getting ready for discharge however she was noted to have persistent hypotension with maps in the high 50s and low 60s in spite of IV fluids.  Patient was asymptomatic and denied lightheadedness, palpitations, weakness.  Overnight observation was requested. ?ED course and data review: BP as low as 71/42 in the ED without a tachycardic response improving to 97/50 with IV fluids.  Blood work significant for potassium of 2.6.  WBC was elevated at 13.5 but with procalcitonin less than 0.1.  Troponin of 5.  EKG, personally viewed and interpreted: Sinus rhythm at 85 with nonspecific ST-T wave changes.  Chest x-ray nonacute.  Urinalysis pending. ?Patient started on potassium riders x2 and given an oral potassium as well as IV magnesium, 2 L LR bolus, IV Vimpat.  Hospitalist consulted for admission. ? ?  ? ?Review of Systems: As mentioned in the history of present illness. All other systems reviewed and are negative. ?Past Medical History:  ?Diagnosis Date  ? Asthma 2013  ? Atrial fibrillation ([place])   ? C. difficile colitis 01/14/2018  ? 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 CHF (congestive heart failure) ([place])   ? COPD (chronic  obstructive pulmonary disease) ([place])   ? Diverticulitis 2010  ? DVT (deep venous thrombosis) ([place])   ? Endometriosis 1990  ? GIB (gastrointestinal bleeding) 01/08/2018  ? Heart disease 2013  ? Hx MRSA infection   ? Iron deficiency   ? Iron deficiency anemia 03/27/2018  ? Lump or mass in breast   ? Restless leg   ? [name]   ? Stomach cancer ([place])   ? ?Past Surgical History:  ?Procedure Laterality Date  ? ABDOMINAL HYSTERECTOMY    ? age 19  ? BREAST BIOPSY Right 2014  ? benign  ? CARDIAC DEFIBRILLATOR PLACEMENT    ? CARDIAC DEFIBRILLATOR PLACEMENT    ? CESAREAN SECTION    ? CHOLECYSTECTOMY    ? COLECTOMY    ? COLONOSCOPY WITH PROPOFOL N/A 02/22/2018  ? Procedure: COLONOSCOPY WITH PROPOFOL;  Surgeon: Toledo, Benay Pike, MD;  Location: [facility] ENDOSCOPY;  Service: Gastroenterology;  Laterality: N/A;  ? ESOPHAGOGASTRODUODENOSCOPY (EGD) WITH PROPOFOL N/A 02/22/2018  ? Procedure: ESOPHAGOGASTRODUODENOSCOPY (EGD) WITH PROPOFOL;  Surgeon: Toledo, Benay Pike, MD;  Location: [facility] ENDOSCOPY;  Service: Gastroenterology;  Laterality: N/A;  ? NASAL SINUS SURGERY  2012  ? OOPHORECTOMY    ? ?Social History:  reports that she has never smoked. She has never used smokeless tobacco. She reports current drug use. She reports that she does not drink alcohol. ? ?Allergies  ?Allergen Reactions  ? Contrast Media [Iodinated Contrast Media] Shortness Of Breath  ?  Per CT report in 2014 pt had break through contrast reaction with hives and SOB following 13 hour prep. MSY   ?  Lidocaine Hives  ? Metrizamide Shortness Of Breath  ? Penicillins Hives and Other (See Comments)  ?  ediate rash, facial/tongue/throat swelling, SOB or lightheadedness with hypotension, tachy  ? [place]   ?  Sz (unclear if new start entresto vs hypoglycemia)  ? Isosorbide Nitrate Other (See Comments)  ?  Headache  ? Latex Hives  ? Ondansetron Other (See Comments)  ?  Severe headache  ? Tizanidine Other (See Comments)  ?  Feels altered  ? Povidone-Iodine Rash  ?   Blistering rash  ? Pulmicort [Budesonide] Itching  ? ? ?Family History  ?Problem Relation Age of Onset  ? Cancer Mother 37  ?     ovarian  ? CAD Mother   ? Cancer Father 7  ?     brain  ? CAD Father   ? Cancer Daughter 69  ?     skin  ? Cancer Maternal Aunt 34  ?     breast  ? Leukemia Paternal Grandfather   ? ? ?Prior to Admission medications   ?Medication Sig Start Date End Date Taking? Authorizing Provider  ?cyclobenzaprine (FLEXERIL) 5 MG tablet Take 5 mg by mouth 3 (three) times daily as needed for muscle spasms. 07/01/21   [provider]  ?fentaNYL (DURAGESIC) 50 MCG/HR Place 1 patch onto the skin every 3 (three) days. ?Patient not taking: Reported on 12/15/2021 10/08/18   Epifanio Lesches, MD  ?furosemide (LASIX) 40 MG tablet Take 80 mg by mouth 2 (two) times daily.  07/09/18   [provider]  ?gabapentin (NEURONTIN) 100 MG capsule Take 100 mg by mouth at bedtime. 06/01/21   [provider]  ?lacosamide 100 MG TABS Take 1 tablet (100 mg total) by mouth 2 (two) times daily. 11/10/21   Lorella Nimrod, MD  ?levothyroxine (SYNTHROID) 200 MCG tablet Take 200 mcg by mouth daily before breakfast. 04/15/21   [provider]  ?LORazepam (ATIVAN) 1 MG tablet Take 1 tablet (1 mg total) by mouth 4 (four) times daily as needed. ?Patient taking differently: Take 1 mg by mouth 4 (four) times daily as needed (stomach cramps or anxiety). 11/10/21   Lorella Nimrod, MD  ?losartan (COZAAR) 25 MG tablet Take 12.5 mg by mouth in the morning and at bedtime.    [provider]  ?metoprolol succinate (TOPROL-XL) 25 MG 24 hr tablet Take 1 tablet (25 mg total) by mouth 2 (two) times daily. 11/10/21   Lorella Nimrod, MD  ?naloxone [facility]) nasal spray 4 mg/0.1 mL Place 1 spray into the nose as needed for opioid reversal. May repeat once in opposite nare. 10/05/21   [provider]  ?nitroGLYCERIN (NITROSTAT) 0.4 MG SL tablet Place 0.4 mg under the tongue every 5 (five) minutes x 3 doses as  needed for chest pain. 11/23/21   [provider]  ?oxyCODONE-acetaminophen (PERCOCET/ROXICET) 5-325 MG tablet Take 1-2 tablets by mouth every 4 (four) hours as needed (breakthrough pain). 01/17/20   [provider]  ?Potassium Chloride ER 20 MEQ TBCR Take 20 mEq by mouth 2 (two) times daily. 11/23/21   [provider]  ?promethazine (PHENERGAN) 12.5 MG suppository Place 1 suppository (12.5 mg total) rectally daily as needed for nausea or vomiting. 12/16/21   Fritzi Mandes, MD  ?promethazine (PHENERGAN) 25 MG tablet Take 25 mg by mouth every 6 (six) hours as needed for nausea/vomiting. 12/13/21   [provider]  ?rOPINIRole (REQUIP) 4 MG tablet Take 4 mg by mouth at bedtime. 01/30/19   [provider]  ?spironolactone (ALDACTONE) 50 MG tablet Take 50 mg by mouth daily. 11/26/18   [provider]  ? ? ?Physical Exam: ?Vitals:  ? 01/16/22 1830 01/16/22 1900 01/16/22 1930 01/16/22 2000  ?BP: (!) 76/55 (!) 82/41 (!) 89/59 (!) 97/50  ?Pulse: 65 (!) 58 65 61  ?Resp: '18 19 19 16  '$ ?Temp:      ?TempSrc:      ?SpO2: 99% 96% 99% 99%  ? ?Physical Exam ?Vitals and nursing note reviewed.  ?Constitutional:   ?   General: She is not in acute distress. ?HENT:  ?   Head: Normocephalic and atraumatic.  ?Cardiovascular:  ?   Rate and Rhythm: Normal rate and regular rhythm.  ?   Heart sounds: Normal heart sounds.  ?Pulmonary:  ?   Effort: Pulmonary effort is normal.  ?   Breath sounds: Normal breath sounds.  ?Abdominal:  ?   Palpations: Abdomen is soft.  ?   Tenderness: There is no abdominal tenderness.  ?Neurological:  ?   Mental Status: Mental status is at baseline.  ? ? ? ?Data Reviewed: ?Relevant notes from primary care and specialist visits, past discharge summaries as available in EHR, including Care Everywhere. ?Prior diagnostic testing as pertinent to current admission diagnoses ?Updated medications and problem lists for reconciliation ?ED course, including vitals, labs, imaging,  treatment and response to treatment ?Triage notes, nursing and pharmacy notes and ED provider's notes ?Notable results as noted in HPI ? ? ?Assessment and Plan: ?* Hypotension ?Persistent hypotension in th

## 2022-01-16 NOTE — Assessment & Plan Note (Signed)
Frequently symptomatic for intractable nausea and vomiting.  Followed by GI and neurology and gets monthly infusions ?

## 2022-01-16 NOTE — Assessment & Plan Note (Signed)
Potassium 2.6, likely related to diuretics and prior frequent episodes of vomiting ?Replete and monitor ?

## 2022-01-17 ENCOUNTER — Other Ambulatory Visit: Payer: Self-pay

## 2022-01-17 DIAGNOSIS — G40919 Epilepsy, unspecified, intractable, without status epilepticus: Secondary | ICD-10-CM | POA: Diagnosis not present

## 2022-01-17 DIAGNOSIS — J449 Chronic obstructive pulmonary disease, unspecified: Secondary | ICD-10-CM | POA: Diagnosis not present

## 2022-01-17 DIAGNOSIS — I959 Hypotension, unspecified: Secondary | ICD-10-CM

## 2022-01-17 DIAGNOSIS — I952 Hypotension due to drugs: Secondary | ICD-10-CM | POA: Diagnosis not present

## 2022-01-17 DIAGNOSIS — I5042 Chronic combined systolic (congestive) and diastolic (congestive) heart failure: Secondary | ICD-10-CM

## 2022-01-17 DIAGNOSIS — R569 Unspecified convulsions: Secondary | ICD-10-CM | POA: Diagnosis not present

## 2022-01-17 LAB — POTASSIUM: Potassium: 4 mmol/L (ref 3.5–5.1)

## 2022-01-17 MED ORDER — SODIUM CHLORIDE 0.9 % IV BOLUS
500.0000 mL | Freq: Once | INTRAVENOUS | Status: AC
Start: 2022-01-17 — End: 2022-01-17
  Administered 2022-01-17: 500 mL via INTRAVENOUS

## 2022-01-17 MED ORDER — LACOSAMIDE 150 MG PO TABS: 150.0000 mg | ORAL_TABLET | Freq: Two times a day (BID) | ORAL | 0 refills | Status: DC

## 2022-01-17 MED ORDER — LACOSAMIDE 50 MG PO TABS: 150.0000 mg | ORAL_TABLET | Freq: Two times a day (BID) | ORAL | Status: DC

## 2022-01-17 NOTE — ED Notes (Signed)
Pt sleeping, NAD noted, even and unlabored RR, skin warm and dry, VSS, safety in place, and call bell in reach for assistance.  ?

## 2022-01-17 NOTE — ED Notes (Signed)
Pt reports urinated earlier while in radiology, but at this time does not feel she needs to urinate. Will inform nurse when she needs to use the BR. Pt c/o 8/10 headache, PRN Tylenol to be administer, refer to [facility].  ?

## 2022-01-17 NOTE — ED Notes (Signed)
Neuro at bedside.

## 2022-01-17 NOTE — Consult Note (Signed)
Neurology Consultation ?Reason for Consult: Seizure ?Referring Physician: Carlynn Spry ? ?CC: Seizure ? ?History is obtained from: Patient ? ?HPI: Brittany Nguyen is a 56 y.o. female with a history of seizures, switched from [place] to [place] in March due to prolonged QT interval.  She was in her normal state of health yesterday, but then began feeling very lightheaded, like "something was wrong."  She then began having seizure activity, and was still convulsing on arrival to the emergency department.  Once seizure was aborted, she has now returned to baseline. ? ?Of note, her blood pressure was very low yesterday afternoon, with systolics getting down to 70s. ? ? ?ROS: A 14 point ROS was performed and is negative except as noted in the HPI.  ? ?Past Medical History:  ?Diagnosis Date  ? Asthma 2013  ? Atrial fibrillation ([place])   ? C. difficile colitis 01/14/2018  ? 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 CHF (congestive heart failure) ([place])   ? COPD (chronic obstructive pulmonary disease) ([place])   ? Diverticulitis 2010  ? DVT (deep venous thrombosis) ([place])   ? Endometriosis 1990  ? GIB (gastrointestinal bleeding) 01/08/2018  ? Heart disease 2013  ? Hx MRSA infection   ? Iron deficiency   ? Iron deficiency anemia 03/27/2018  ? Lump or mass in breast   ? Restless leg   ? Seizures ([place])   ? Stomach cancer ([place])   ? ? ? ?Family History  ?Problem Relation Age of Onset  ? Cancer Mother 75  ?     ovarian  ? CAD Mother   ? Cancer Father 44  ?     brain  ? CAD Father   ? Cancer Daughter 31  ?     skin  ? Cancer Maternal Aunt 34  ?     breast  ? Leukemia Paternal Grandfather   ? ? ? ?Social History:  reports that she has never smoked. She has never used smokeless tobacco. She reports current drug use. She reports that she does not drink alcohol. ? ? ?Exam: ?Current vital signs: ?BP 102/61   Pulse 70   Temp 98.8 ?F (37.1 ?C) (Oral)   Resp 14   Wt 85.6 kg   LMP 01/22/2012 (Approximate) Comment: Hysterectomy 5 years  ago  SpO2 96%   BMI 31.40 kg/m?  ?Vital signs in last 24 hours: ?Temp:  [98.8 ?F (37.1 ?C)] 98.8 ?F (37.1 ?C) (05/14 1141) ?Pulse Rate:  [58-90] 70 (05/15 0900) ?Resp:  [9-23] 14 (05/15 0900) ?BP: (71-114)/(33-81) 102/61 (05/15 0900) ?SpO2:  [92 %-100 %] 96 % (05/15 0900) ?Weight:  [85.6 kg] 85.6 kg (05/14 2100) ? ? ?Physical Exam  ?Constitutional: Appears well-developed and well-nourished.  ? ? ?Neuro: ?Mental Status: ?Patient is awake, alert, oriented to person, place, month, year, and situation. ?Patient is able to give a clear and coherent history. ?No signs of aphasia or neglect ?Cranial Nerves: ?II: Visual Fields are full. Pupils are equal, round, and reactive to light.   ?III,IV, VI: EOMI without ptosis or diploplia.  ?V: Facial sensation is symmetric to temperature ?VII: Facial movement is symmetric.  ?VIII: hearing is intact to voice ?X: Uvula elevates symmetrically ?XI: Shoulder shrug is symmetric. ?XII: tongue is midline without atrophy or fasciculations.  ?Motor: ?Tone is normal. Bulk is normal. 5/5 strength was present in all four extremities.  ?Sensory: ?Sensation is symmetric to light touch and temperature in the arms and legs. ?Cerebellar: ?FNF intact bilaterally ? ? ? ? ? ?  I have reviewed labs in epic and the results pertinent to this consultation are: ?Potassium 2.6 ?Calcium 8.5 ? ? ?I have reviewed the images obtained: CT head from March-unremarkable ? ?Impression: 56 year old female with a history of seizures who presents with breakthrough seizure.  Her description of a prodrome prior to her seizure could be due to differences in Vimpat slowing the generalization, or could be suggestive that hypotension precipitated the seizure.  Her BP seems to have stabilized since then. ? ?Either way, given the duration of the seizure I would favor increasing her Vimpat. ? ?Recommendations: ?1) increase lacosamide to 150 twice daily ?2) no further recommendations at this time, please call if further  questions or concerns. ? ? ?Roland Rack, MD ?[facility] ?[phone] ? ?If 7pm- 7am, please page neurology on call as listed in [place]. ? ?

## 2022-01-17 NOTE — ED Notes (Signed)
Pt appears to be sleeping, NAD noted, even and unlabored RR, skin warm and dry, lights off to room for comfort per pt request. VSS and has improved for pt, safety in place, and call bell in reach for assistance.  ?

## 2022-01-17 NOTE — ED Notes (Signed)
Pt sleeping, NAD noted, even and unlabored RR, skin warm and dry, VSS, safety in place, and call bell in reach for assistance. ?

## 2022-01-18 LAB — LACOSAMIDE: Lacosamide: 8.2 ug/mL (ref 5.0–10.0)

## 2022-02-14 ENCOUNTER — Encounter: Payer: Self-pay | Admitting: Emergency Medicine

## 2022-02-14 ENCOUNTER — Emergency Department
Admission: EM | Admit: 2022-02-14 | Discharge: 2022-02-14 | Discharge status: Against Medical Advice | Payer: Medicare Other | Attending: Emergency Medicine | Admitting: Emergency Medicine

## 2022-02-14 ENCOUNTER — Other Ambulatory Visit: Payer: Self-pay

## 2022-02-14 DIAGNOSIS — R519 Headache, unspecified: Secondary | ICD-10-CM | POA: Diagnosis not present

## 2022-02-14 DIAGNOSIS — R569 Unspecified convulsions: Secondary | ICD-10-CM | POA: Insufficient documentation

## 2022-02-14 DIAGNOSIS — Z5321 Procedure and treatment not carried out due to patient leaving prior to being seen by health care provider: Secondary | ICD-10-CM | POA: Insufficient documentation

## 2022-02-14 LAB — CBC
HCT: 42.1 % (ref 36.0–46.0)
Hemoglobin: 14.3 g/dL (ref 12.0–15.0)
MCH: 30 pg (ref 26.0–34.0)
MCHC: 34 g/dL (ref 30.0–36.0)
MCV: 88.4 fL (ref 80.0–100.0)
Platelets: 254 10*3/uL (ref 150–400)
RBC: 4.76 MIL/uL (ref 3.87–5.11)
RDW: 13.5 % (ref 11.5–15.5)
WBC: 10.5 10*3/uL (ref 4.0–10.5)
nRBC: 0 % (ref 0.0–0.2)

## 2022-02-14 LAB — BASIC METABOLIC PANEL
Anion gap: 9 (ref 5–15)
BUN: 15 mg/dL (ref 6–20)
CO2: 29 mmol/L (ref 22–32)
Calcium: 8.3 mg/dL — ABNORMAL LOW (ref 8.9–10.3)
Chloride: 101 mmol/L (ref 98–111)
Creatinine, Ser: 0.97 mg/dL (ref 0.44–1.00)
GFR, Estimated: >60 mL/min (ref >60)
Glucose, Bld: 108 mg/dL — ABNORMAL HIGH (ref 70–99)
Potassium: 3.5 mmol/L (ref 3.5–5.1)
Sodium: 139 mmol/L (ref 135–145)

## 2022-02-14 NOTE — ED Triage Notes (Addendum)
Pt via EMS from [facility], pt here after seizure. Pt is currently A&OX4, pt has a hx and takes Vimpat denies any missed doses. Last seizure was less than a month ago. Unknown how long its was. Pt c/o headache. Pt is A&OX4 and NAD

## 2022-02-21 IMAGING — CT CT NECK W/O CM
4 series · 15 of 33 positions shown, 18 images · non-contrast
Comparison: None.

CLINICAL DATA: Sore throat. Difficulty swallowing. Epiglottitis or
tonsillitis. Patient unable to tolerate contrast secondary to prior
contrast allergies. Fever.

EXAM:
CT NECK WITHOUT CONTRAST
TECHNIQUE: Multidetector CT imaging of the neck was performed following the
standard protocol without intravenous contrast.

[Series 2: axial neck · axial · 0.54mm/px · z∈[-238,-102]mm · 5 of 102 slices shown, 7 images]
[im 17/102  soft-tissue]
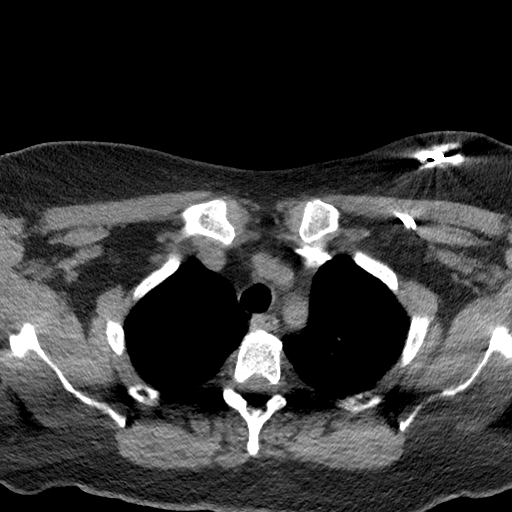
[im 17/102  bone]
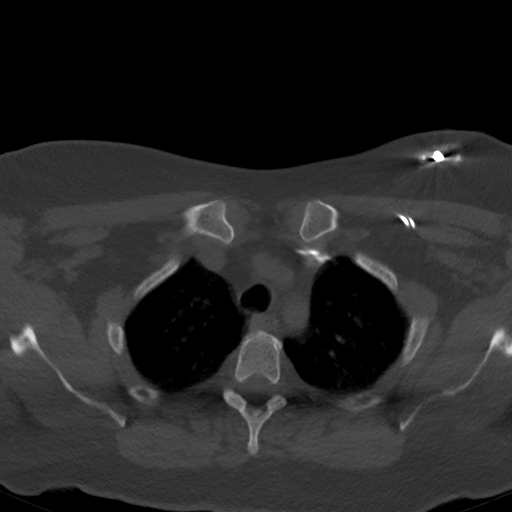
[im 34/102  bone]
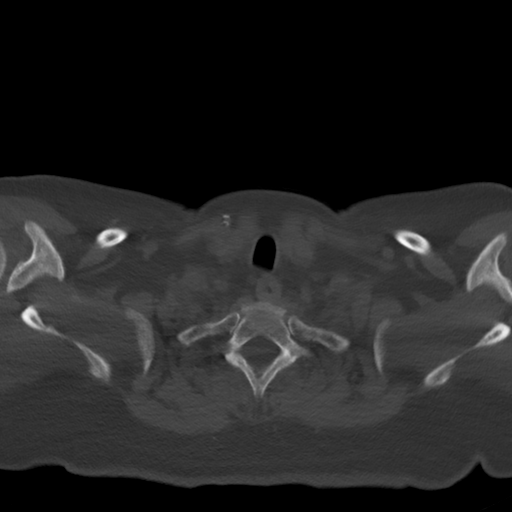
[im 51/102  bone]
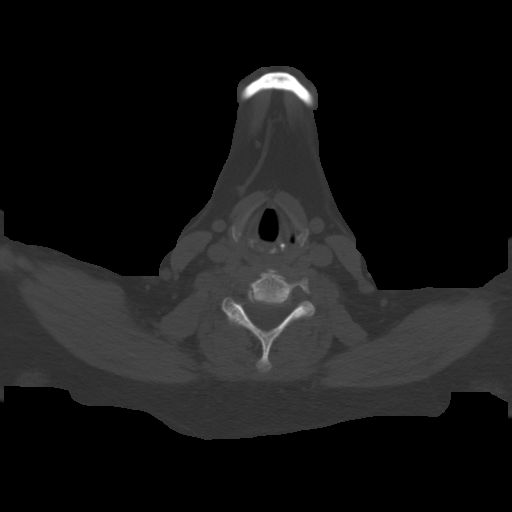
[im 68/102  bone]
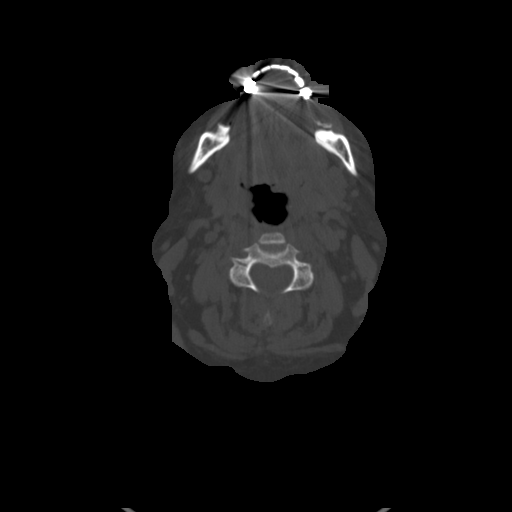
[im 85/102  soft-tissue]
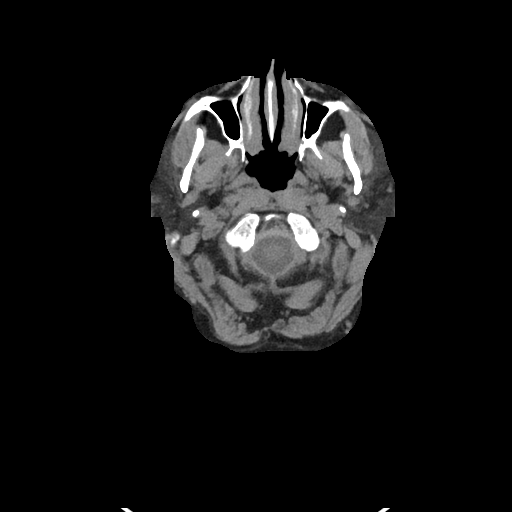
[im 85/102  bone]
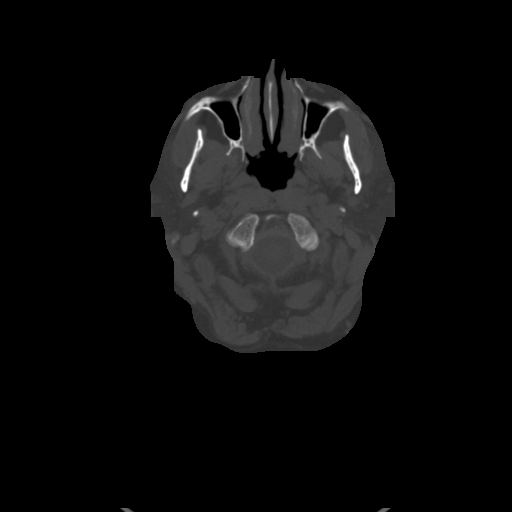

[Series 5: sag neck · sagittal · 0.42mm/px · 5 of 124 slices shown, 6 images]
[im 42/124  bone]
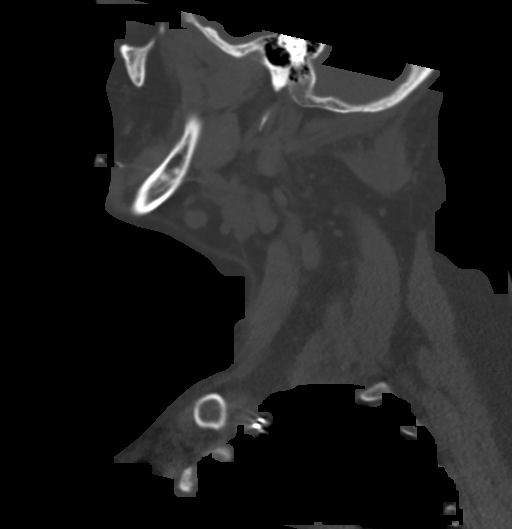
[im 52/124  bone]
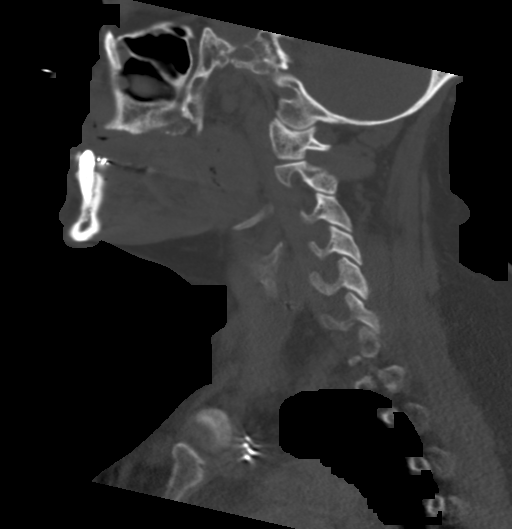
[im 62/124  soft-tissue]
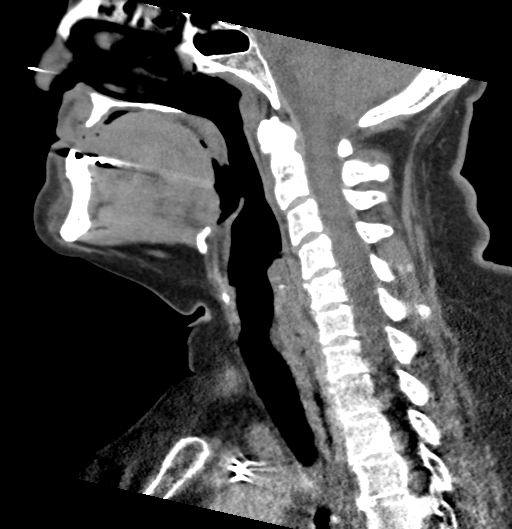
[im 62/124  bone]
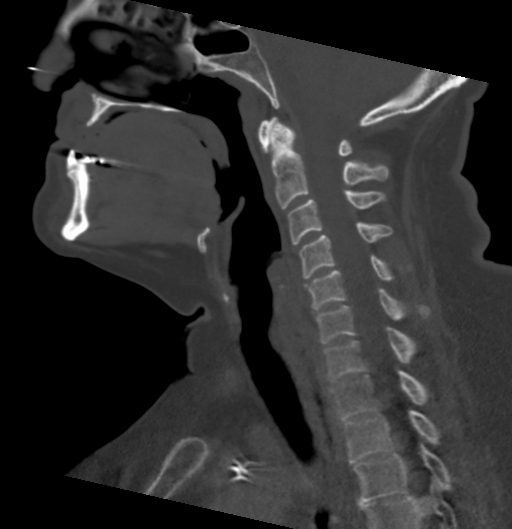
[im 72/124  bone]
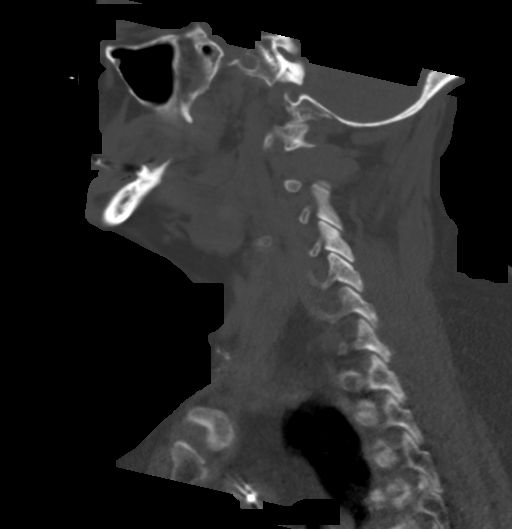
[im 83/124  bone]
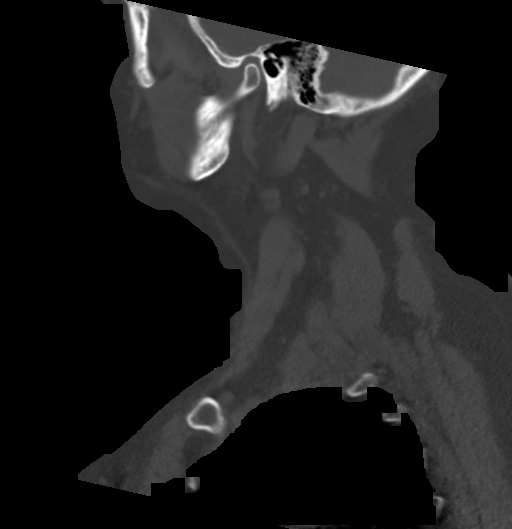

[Series 6: cor neck · coronal · 0.44mm/px · 3 of 109 slices shown]
[im 22/109  bone]
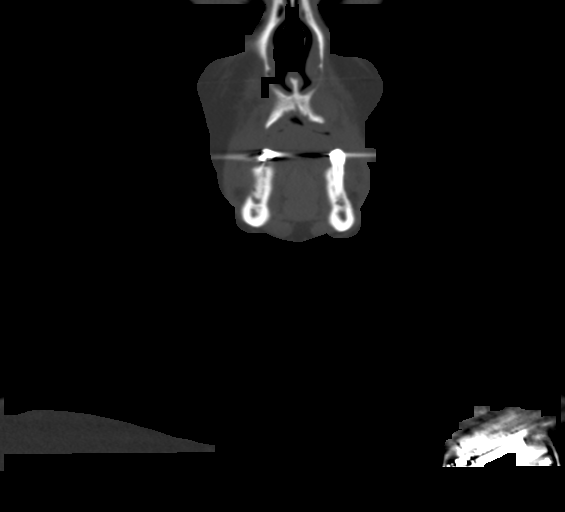
[im 44/109  bone]
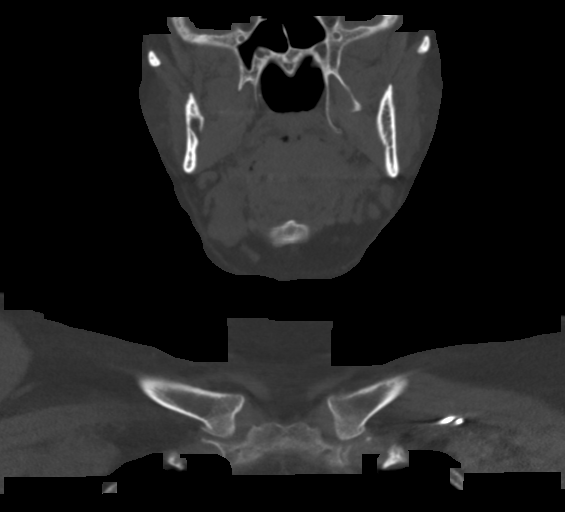
[im 65/109  bone]
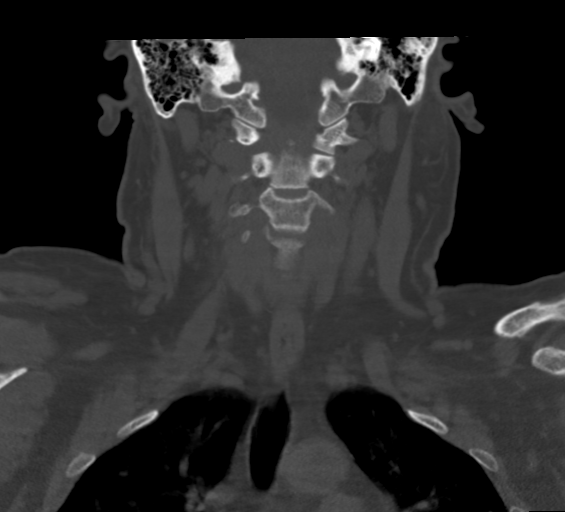

[Series 7: orthogonal ax · axial · 0.42mm/px · z∈[-262,-225]mm · 2 of 113 slices shown]
[im 19/113  bone]
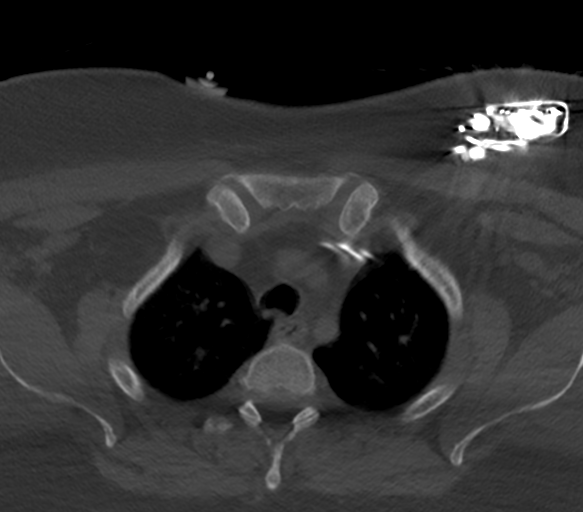
[im 38/113  bone]
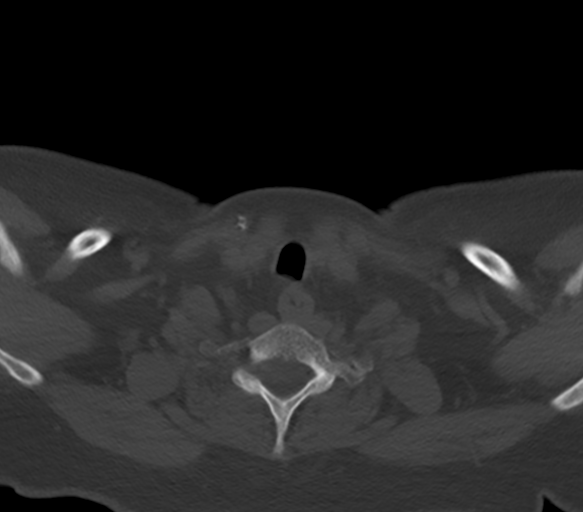

[15 of 33 positions shown; findings below may reference images not displayed]

FINDINGS: Pharynx and larynx: Mild prominence of palatine tonsils noted
bilaterally. No fluid collections present. There is some stranding
of the right parapharyngeal fat. Mild prominence of lingual tonsils
noted without a discrete mass. Nasopharynx is clear. Soft palate is
normal.

Insert normal epiglottis Aryepiglottic folds and piriform sinuses
are clear. Vocal cords are midline and symmetric. Trachea is clear.

Salivary glands: Submandibular and parotid glands are within normal
limits bilaterally.

Thyroid: Normal

Lymph nodes: Bilateral reactive type lymph nodes present. No
pathologically enlarged rounded nodes present. No necrotic nodes.

Vascular: No significant vascular calcifications noted.

Limited intracranial: Within normal limits.

Visualized orbits: The globes and orbits are within normal limits.

Mastoids and visualized paranasal sinuses: Fluid level is present in
the left sphenoid sinus.

Skeleton: Vertebral body heights and alignment are normal. No focal
lytic or blastic lesions are present.

Upper chest: Lung apices are clear. Thoracic inlet is within normal
limits. Cardiac pacing wires anterior via a left subclavian
approach.
IMPRESSION: 1. Mild prominence of the palatine tonsils and lingual tonsils may
represent acute pharyngitis/tonsillitis. Asymmetric parapharyngeal
fat stranding noted on the right. This may be part of an
inflammatory process.
2. No abscess.
3. Bilateral reactive type lymph nodes.
4. Left sphenoid sinus disease.

## 2022-02-21 IMAGING — CR DG CHEST 2V
1 series · 2 of 2 positions shown · non-contrast
Comparison: Radiographs 09/20/2020.  CT 12/15/2012.

CLINICAL DATA: Shortness of breath.

EXAM:
CHEST - 2 VIEW

[Series 1: dg chest 2 view · 0.14mm/px · 2 of 2 slices shown]
[im 1/2]
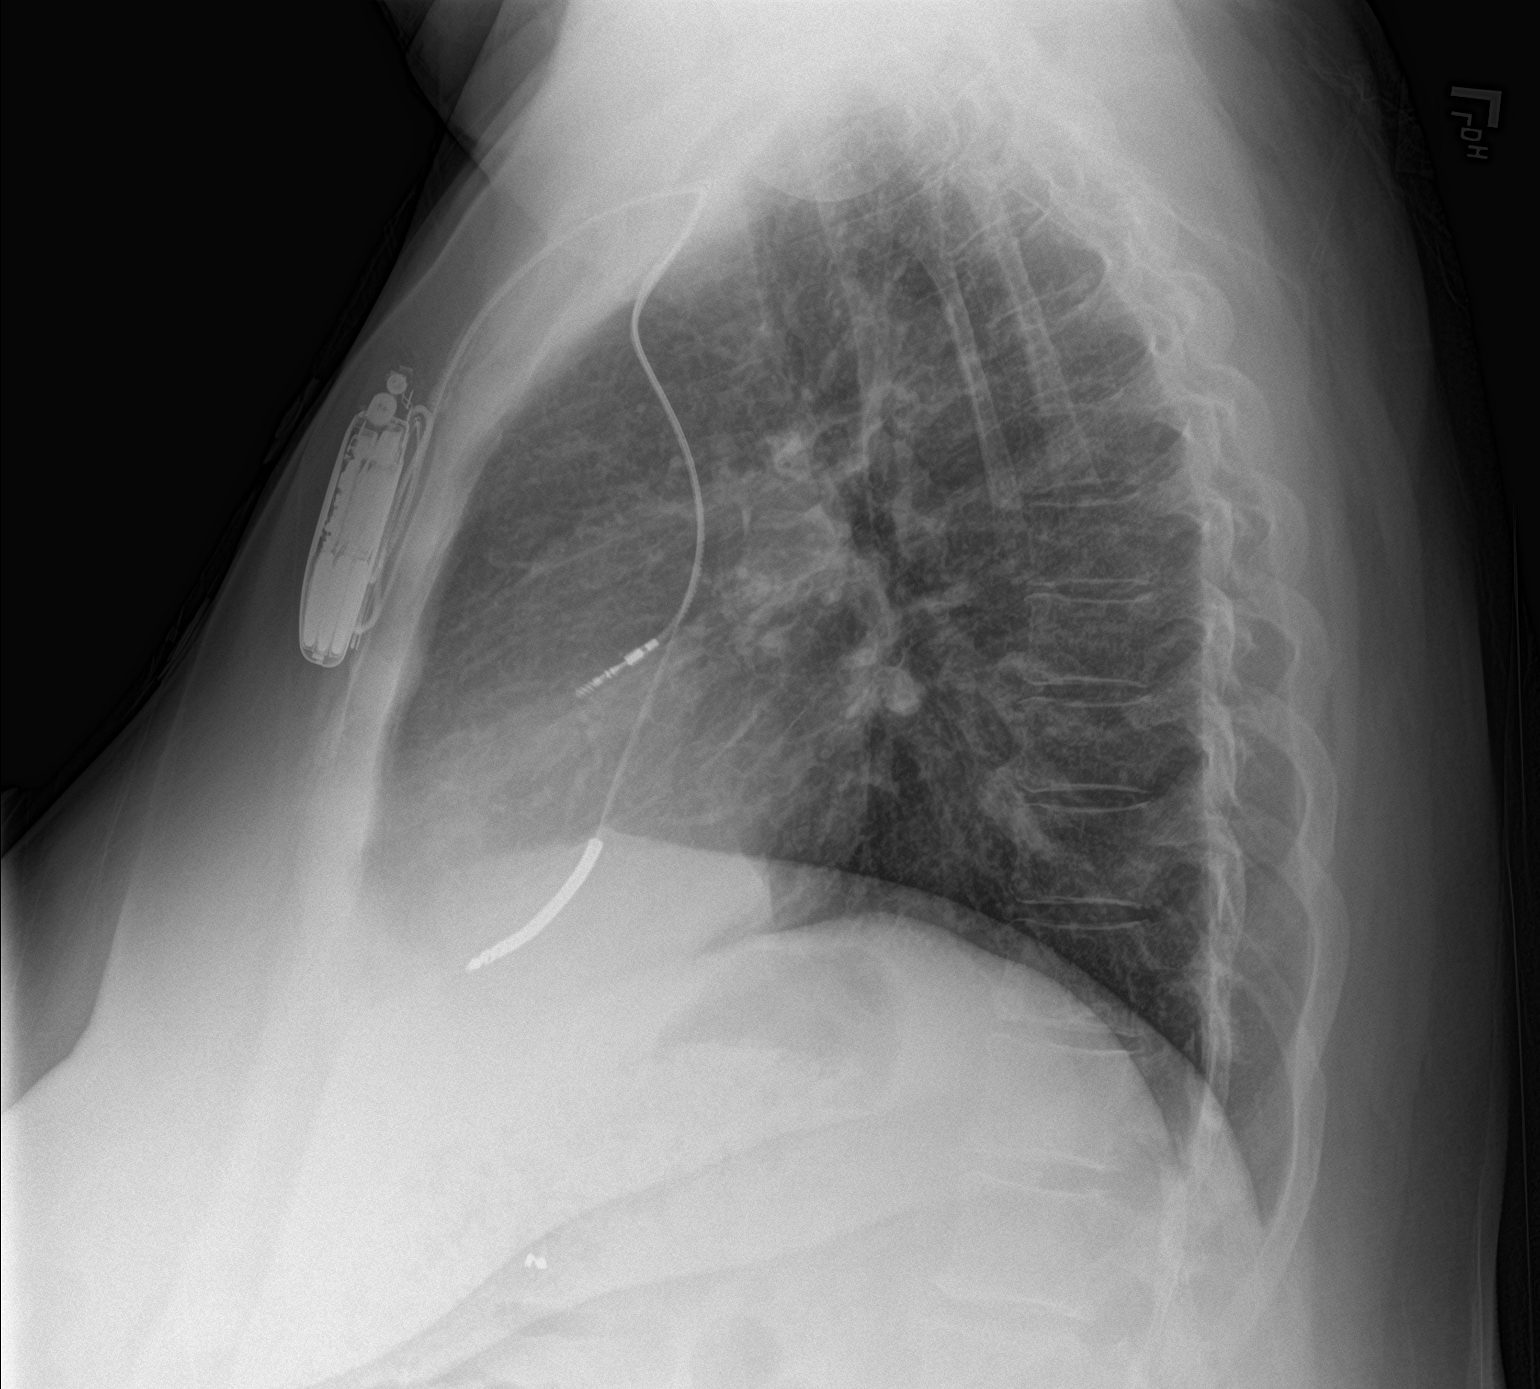
[im 2/2]
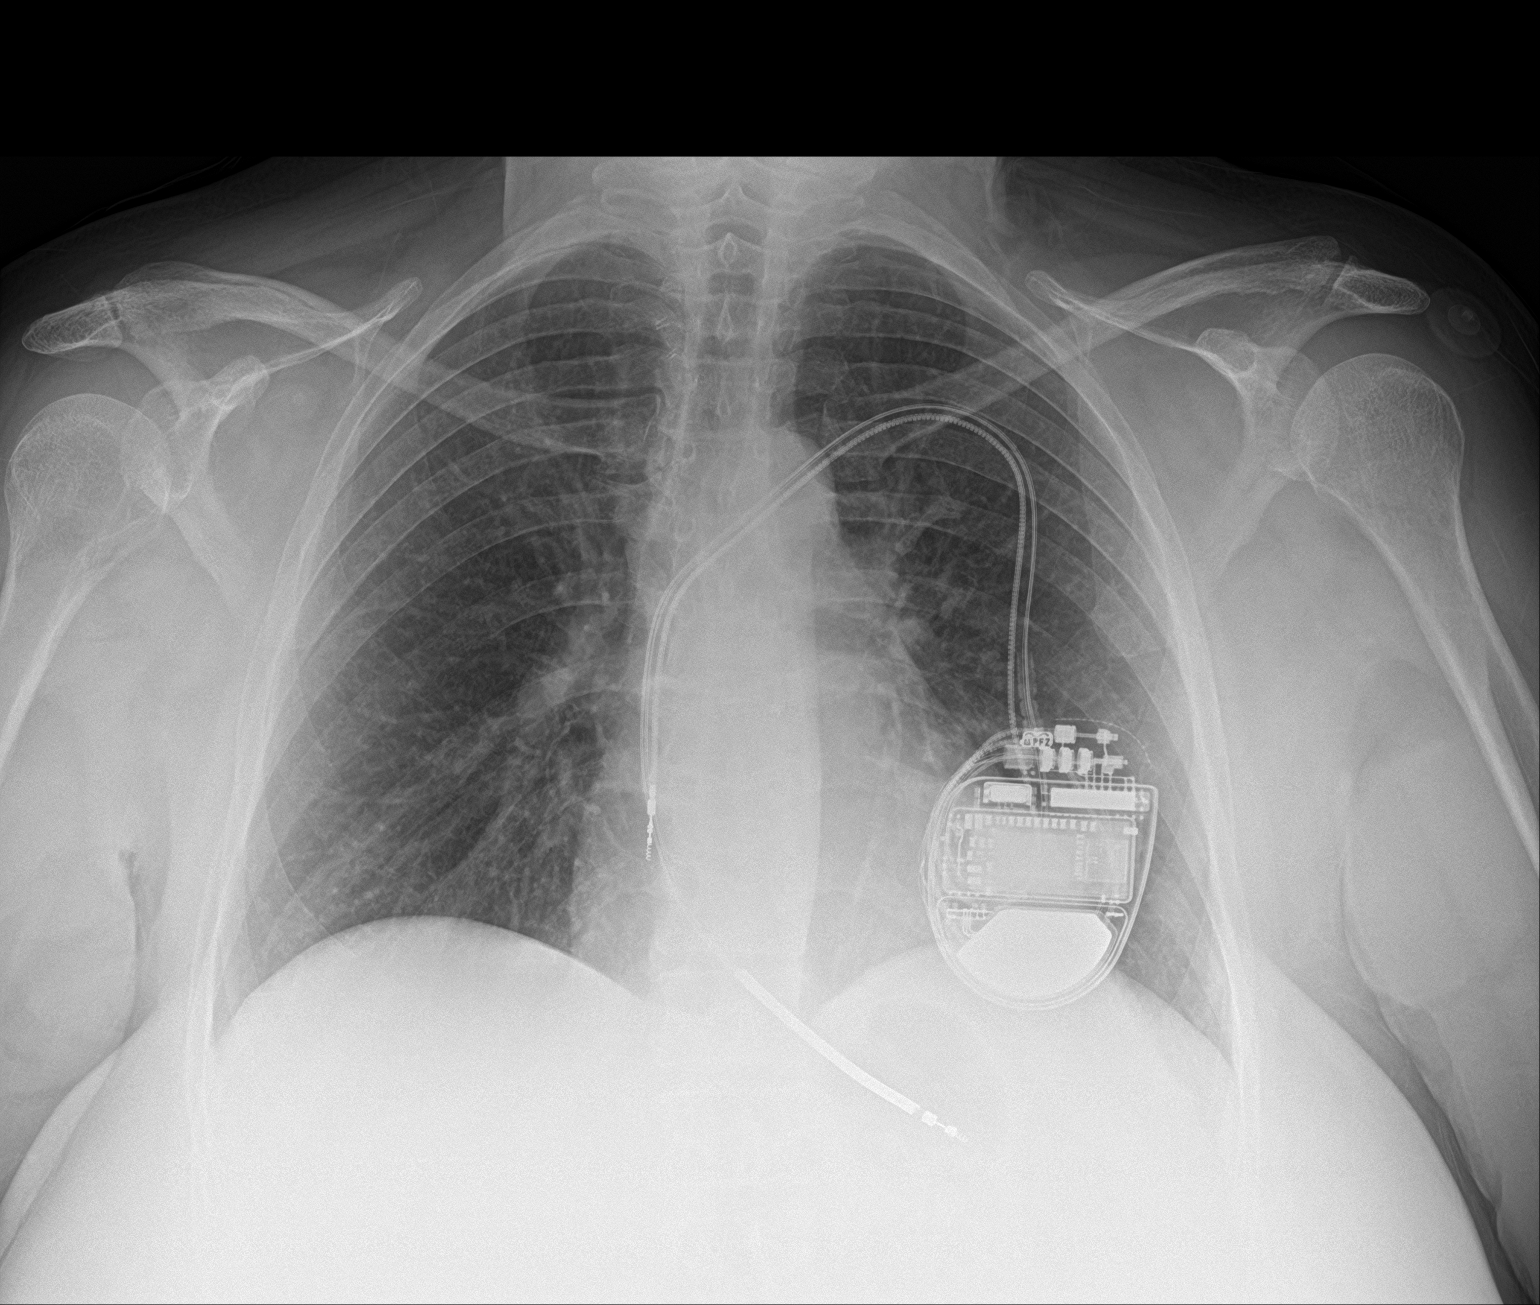

[2 of 2 positions shown; findings below may reference images not displayed]

FINDINGS: Left subclavian AICD leads appear unchanged. The heart size and
mediastinal contours are stable. There is chronic central airway
thickening. No edema, confluent airspace opacity, pleural effusion
or pneumothorax. The bones appear unchanged.
IMPRESSION: Stable chest without evidence of acute cardiopulmonary process.
Probable chronic central airway thickening.

## 2022-03-01 ENCOUNTER — Observation Stay
Admission: EM | Admit: 2022-03-01 | Payer: Medicare Other | Source: Other Acute Inpatient Hospital | Admitting: Internal Medicine

## 2022-03-01 ENCOUNTER — Observation Stay
Admission: EM | Admit: 2022-03-01 | Discharge: 2022-03-02 | Disposition: A | Discharge status: Home / Self Care | Payer: Medicare Other | Attending: Emergency Medicine | Admitting: Emergency Medicine

## 2022-03-01 ENCOUNTER — Emergency Department: Payer: Medicare Other

## 2022-03-01 ENCOUNTER — Other Ambulatory Visit: Payer: Self-pay

## 2022-03-01 DIAGNOSIS — Z79899 Other long term (current) drug therapy: Secondary | ICD-10-CM | POA: Diagnosis not present

## 2022-03-01 DIAGNOSIS — Z95810 Presence of automatic (implantable) cardiac defibrillator: Secondary | ICD-10-CM | POA: Diagnosis not present

## 2022-03-01 DIAGNOSIS — Z86718 Personal history of other venous thrombosis and embolism: Secondary | ICD-10-CM | POA: Insufficient documentation

## 2022-03-01 DIAGNOSIS — I5042 Chronic combined systolic (congestive) and diastolic (congestive) heart failure: Secondary | ICD-10-CM | POA: Insufficient documentation

## 2022-03-01 DIAGNOSIS — J449 Chronic obstructive pulmonary disease, unspecified: Secondary | ICD-10-CM | POA: Insufficient documentation

## 2022-03-01 DIAGNOSIS — D371 Neoplasm of uncertain behavior of stomach: Secondary | ICD-10-CM | POA: Diagnosis not present

## 2022-03-01 DIAGNOSIS — R569 Unspecified convulsions: Secondary | ICD-10-CM

## 2022-03-01 DIAGNOSIS — E039 Hypothyroidism, unspecified: Secondary | ICD-10-CM | POA: Diagnosis not present

## 2022-03-01 DIAGNOSIS — I2510 Atherosclerotic heart disease of native coronary artery without angina pectoris: Secondary | ICD-10-CM | POA: Diagnosis not present

## 2022-03-01 DIAGNOSIS — E1140 Type 2 diabetes mellitus with diabetic neuropathy, unspecified: Secondary | ICD-10-CM | POA: Diagnosis not present

## 2022-03-01 DIAGNOSIS — G40909 Epilepsy, unspecified, not intractable, without status epilepticus: Principal | ICD-10-CM | POA: Insufficient documentation

## 2022-03-01 DIAGNOSIS — J45909 Unspecified asthma, uncomplicated: Secondary | ICD-10-CM | POA: Diagnosis not present

## 2022-03-01 DIAGNOSIS — I4891 Unspecified atrial fibrillation: Secondary | ICD-10-CM | POA: Insufficient documentation

## 2022-03-01 LAB — CBC WITH DIFFERENTIAL/PLATELET
Abs Immature Granulocytes: 0.09 10*3/uL — ABNORMAL HIGH (ref 0.00–0.07)
Basophils Absolute: 0.1 10*3/uL (ref 0.0–0.1)
Basophils Relative: 1 %
Eosinophils Absolute: 0.2 10*3/uL (ref 0.0–0.5)
Eosinophils Relative: 2 %
HCT: 40.8 % (ref 36.0–46.0)
Hemoglobin: 13.7 g/dL (ref 12.0–15.0)
Immature Granulocytes: 1 %
Lymphocytes Relative: 22 %
Lymphs Abs: 1.9 10*3/uL (ref 0.7–4.0)
MCH: 30.2 pg (ref 26.0–34.0)
MCHC: 33.6 g/dL (ref 30.0–36.0)
MCV: 89.9 fL (ref 80.0–100.0)
Monocytes Absolute: 0.6 10*3/uL (ref 0.1–1.0)
Monocytes Relative: 7 %
Neutro Abs: 5.9 10*3/uL (ref 1.7–7.7)
Neutrophils Relative %: 67 %
Platelets: 219 10*3/uL (ref 150–400)
RBC: 4.54 MIL/uL (ref 3.87–5.11)
RDW: 13.4 % (ref 11.5–15.5)
WBC: 8.6 10*3/uL (ref 4.0–10.5)
nRBC: 0 % (ref 0.0–0.2)

## 2022-03-01 LAB — BASIC METABOLIC PANEL
Anion gap: 12 (ref 5–15)
BUN: 12 mg/dL (ref 6–20)
CO2: 27 mmol/L (ref 22–32)
Calcium: 8.5 mg/dL — ABNORMAL LOW (ref 8.9–10.3)
Chloride: 103 mmol/L (ref 98–111)
Creatinine, Ser: 0.99 mg/dL (ref 0.44–1.00)
GFR, Estimated: >60 mL/min (ref >60)
Glucose, Bld: 107 mg/dL — ABNORMAL HIGH (ref 70–99)
Potassium: 3.6 mmol/L (ref 3.5–5.1)
Sodium: 142 mmol/L (ref 135–145)

## 2022-03-01 MED ORDER — KETOROLAC TROMETHAMINE 15 MG/ML IJ SOLN
15.0000 mg | Freq: Once | INTRAMUSCULAR | Status: AC
  Administered 2022-03-01: 15 mg via INTRAVENOUS
  Filled 2022-03-01: qty 1

## 2022-03-01 MED ORDER — SODIUM CHLORIDE 0.9 % IV BOLUS
1000.0000 mL | Freq: Once | INTRAVENOUS | Status: AC
  Administered 2022-03-01: 1000 mL via INTRAVENOUS

## 2022-03-01 MED ORDER — SODIUM CHLORIDE 0.9 % IV SOLN
2000.0000 mg | Freq: Once | INTRAVENOUS | Status: AC
  Administered 2022-03-01: 2000 mg via INTRAVENOUS
  Filled 2022-03-01: qty 20

## 2022-03-01 MED ORDER — METOCLOPRAMIDE HCL 5 MG/ML IJ SOLN
10.0000 mg | Freq: Once | INTRAMUSCULAR | Status: AC
  Administered 2022-03-01: 10 mg via INTRAVENOUS
  Filled 2022-03-01: qty 2

## 2022-03-01 NOTE — ED Notes (Signed)
Seizure pads applied to bed.  

## 2022-03-02 DIAGNOSIS — R569 Unspecified convulsions: Secondary | ICD-10-CM | POA: Diagnosis not present

## 2022-03-02 DIAGNOSIS — G40909 Epilepsy, unspecified, not intractable, without status epilepticus: Secondary | ICD-10-CM | POA: Diagnosis not present

## 2022-03-02 LAB — URINE DRUG SCREEN, QUALITATIVE (ARMC ONLY)
Amphetamines, Ur Screen: NOT DETECTED
Barbiturates, Ur Screen: NOT DETECTED
Benzodiazepine, Ur Scrn: POSITIVE — AB
Cannabinoid 50 Ng, Ur ~~LOC~~: NOT DETECTED
Cocaine Metabolite,Ur ~~LOC~~: NOT DETECTED
MDMA (Ecstasy)Ur Screen: NOT DETECTED
Methadone Scn, Ur: NOT DETECTED
Opiate, Ur Screen: POSITIVE — AB
Phencyclidine (PCP) Ur S: NOT DETECTED
Tricyclic, Ur Screen: POSITIVE — AB

## 2022-03-02 LAB — URINALYSIS, ROUTINE W REFLEX MICROSCOPIC
Bacteria, UA: NONE SEEN
Bilirubin Urine: NEGATIVE
Glucose, UA: NEGATIVE mg/dL
Hgb urine dipstick: NEGATIVE
Ketones, ur: NEGATIVE mg/dL
Leukocytes,Ua: NEGATIVE
Nitrite: NEGATIVE
Protein, ur: NEGATIVE mg/dL
Specific Gravity, Urine: 1.012 (ref 1.005–1.030)
Squamous Epithelial / HPF: NONE SEEN (ref 0–5)
pH: 5 (ref 5.0–8.0)

## 2022-03-02 MED ORDER — LACOSAMIDE 50 MG PO TABS
150.0000 mg | ORAL_TABLET | Freq: Once | ORAL | Status: AC
Start: 2022-03-02 — End: 2022-03-02
  Administered 2022-03-02: 150 mg via ORAL
  Filled 2022-03-02: qty 3

## 2022-03-02 NOTE — Consult Note (Signed)
Neurology Consultation Reason for Consult: Seizure  Requesting Physician: Vladimir Crofts   CC: Seizures   History is obtained from: Patient, husband at bedside and chart review   HPI: Brittany Nguyen is a 56 y.o. female with a past medical history significant for epilepsy on Vimpat, nonischemic cardiomyopathy s/p AICD, coronary artery disease, congestive heart failure, obstructive sleep apnea on CPAP, diabetes, hypothyroidism, restless leg syndrome, chronic opiate use, and stomach cancer (on lanreotide).  She has been having some intermittent emesis once or twice a week especially triggered by greasy foods since her stomach cancer diagnosis.  Otherwise she has been in her normal state of health.  She did have a migraine headache for which she received a prednisone taper starting on 6/22.  She received her last lanreotide infusion on 6/24, at which time they noted she had a slight leukocytosis with reported initial concern for infection but then she was prescribed further steroids by her oncologist.  She had been recommended to increase her Vimpat to 150 twice daily on last admission here, but had not yet started this increased dose.  Her husband was actually on his way to pick up the prescription for the increased dose yesterday evening and on his return he found her having generalized shaking activity.  He does also note that sometimes her seizures seem to be anxiety provoked/anxiety related and he is able to talk her out of having one.  She has been referred for epilepsy monitoring unit stay at [facility] but this has not been scheduled yet.  She notes that most of her spells she will have an aura of feeling like she is starting to shake or feeling unwell, difficult for her to describe precisely  Initially, due to delayed return to baseline there was concern that she may be in nonconvulsive status and there was a plan to transfer to [facility].  However at the time of my evaluation she has  returned to baseline.  ROS: All other review of systems was negative except as noted in the HPI.   Past Medical History:  Diagnosis Date   Asthma 2013   Atrial fibrillation ([place])    C. 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    CHF (congestive heart failure) ([place])    COPD (chronic obstructive pulmonary disease) ([place])    Diverticulitis 2010   DVT (deep venous thrombosis) ([place])    Endometriosis 1990   GIB (gastrointestinal bleeding) 01/08/2018   Heart disease 2013   Hx MRSA infection    Iron deficiency    Iron deficiency anemia 03/27/2018   Lump or mass in breast    Restless leg    Seizures (H[facility])    Stomach cancer (H[facility])    Past Surgical History:  Procedure Laterality Date   ABDOMINAL HYSTERECTOMY     age 58   BREAST BIOPSY Right 2014   benign   CARDIAC DEFIBRILLATOR PLACEMENT     CARDIAC DEFIBRILLATOR PLACEMENT     CESAREAN SECTION     CHOLECYSTECTOMY     COLECTOMY     COLONOSCOPY WITH PROPOFOL N/A 02/22/2018   Procedure: COLONOSCOPY WITH PROPOFOL;  Surgeon: Toledo, Benay Pike, MD;  Location: [facility] ENDOSCOPY;  Service: Gastroenterology;  Laterality: N/A;   ESOPHAGOGASTRODUODENOSCOPY (EGD) WITH PROPOFOL N/A 02/22/2018   Procedure: ESOPHAGOGASTRODUODENOSCOPY (EGD) WITH PROPOFOL;  Surgeon: Toledo, Benay Pike, MD;  Location: [facility] ENDOSCOPY;  Service: Gastroenterology;  Laterality: N/A;   NASAL SINUS SURGERY  2012   OOPHORECTOMY  Current Outpatient Medications  Medication Instructions   cyclobenzaprine (FLEXERIL) 5 mg, Oral, 3 times daily PRN   escitalopram (LEXAPRO) 10 mg, Oral, Daily   fentaNYL (DURAGESIC) 75 MCG/HR 1 patch, Transdermal, Every 3 DAYS   furosemide (LASIX) 40 MG tablet 2 tablets, Oral, 2 times daily   gabapentin (NEURONTIN) 100 mg, Oral, Daily at bedtime   Lacosamide 150 mg, Oral, 2 times daily   levETIRAcetam (KEPPRA) 500 mg, Oral, 2 times daily   levothyroxine (SYNTHROID) 175 mcg, Oral, Every morning   LORazepam  (ATIVAN) 1 mg, Oral, 4 times daily PRN   losartan (COZAAR) 25 mg, Oral, Every morning   metoprolol succinate (TOPROL-XL) 50 mg, Oral, 2 times daily   morphine (MSIR) 15 MG tablet Oral   naloxone (NARCAN) nasal spray 4 mg/0.1 mL 1 spray, Nasal, As needed, May repeat once in opposite nare.   nitroGLYCERIN (NITROSTAT) 0.4 mg, Sublingual, Every 5 min x3 PRN   Oxycodone HCl 10 mg, Oral, As directed, mg Take 1 tablet (10 mg total) by mouth every 4 (four) hours as needed for Pain for up to 20 days    oxyCODONE-acetaminophen (PERCOCET/ROXICET) 5-325 MG tablet 1-2 tablets, Oral, Every 4 hours PRN   Potassium Chloride ER 20 MEQ TBCR 1 tablet, Oral, 2 times daily   predniSONE (STERAPRED UNI-PAK 21 TAB) 10 MG (21) TBPK tablet Oral   promethazine (PHENERGAN) 25 mg, Oral, Every 6 hours PRN   promethazine (PHENERGAN) 12.5 mg, Rectal, Daily PRN   rOPINIRole (REQUIP) 4 mg, Oral, Daily at bedtime     Family History  Problem Relation Age of Onset   Cancer Mother 37       ovarian   CAD Mother    Cancer Father 71       brain   CAD Father    Cancer Daughter 60       skin   Cancer Maternal Aunt 52       breast   Leukemia Paternal Grandfather    Social History:  reports that she has never smoked. She has never used smokeless tobacco. She reports current drug use. She reports that she does not drink alcohol.   Exam: Current vital signs: BP 93/63   Pulse 62   Temp 98.1 F (36.7 C) (Oral)   Resp 19   LMP 01/22/2012 (Approximate) Comment: Hysterectomy 5 years ago  SpO2 99%  Vital signs in last 24 hours: Temp:  [98.1 F (36.7 C)] 98.1 F (36.7 C) (06/27 1931) Pulse Rate:  [62-94] 62 (06/28 0730) Resp:  [12-25] 19 (06/28 0730) BP: (84-107)/(55-70) 93/63 (06/28 0730) SpO2:  [95 %-100 %] 99 % (06/28 0730)   Physical Exam  Constitutional: Appears well-developed and well-nourished.  Psych: Affect appropriate to situation, calm and cooperative Eyes: No scleral injection HENT: No oropharyngeal  obstruction.  MSK: no joint deformities.  Cardiovascular: Perfusing extremities well Respiratory: Effort normal, non-labored breathing GI: Soft.  No distension. There is no tenderness.  Skin: Warm dry and intact visible skin  Neuro: Mental Status: Patient is awake, alert, oriented to person, place, month, year, and situation. Patient is able to give a clear and coherent history, although she is amnestic to the spell she had yesterday. No signs of aphasia or neglect Cranial Nerves: II: Visual Fields are full. Pupils are equal, round, and reactive to light.   III,IV, VI: EOMI without ptosis or diploplia.  V: Facial sensation is symmetric to temperature VII: Facial movement is symmetric.  VIII: hearing is intact to voice X:  Uvula elevates symmetrically XI: Shoulder shrug is symmetric. XII: tongue is midline without atrophy or fasciculations.  Motor: Tone is normal. Bulk is normal.  No pronator drift.  4/5 hip flexor strength bilaterally Sensory: Sensation is symmetric to light touch in the arms and legs. Deep Tendon Reflexes: Unable to elicit patellar reflexes.  2+ and symmetric in the brachioradialis Cerebellar: FNF and HKS are intact bilaterally Gait:  Deferred    I have reviewed labs in epic and the results pertinent to this consultation are:  Basic Metabolic Panel: Recent Labs  Lab 03/01/22 1956  NA 142  K 3.6  CL 103  CO2 27  GLUCOSE 107*  BUN 12  CREATININE 0.99  CALCIUM 8.5*    CBC: Recent Labs  Lab 03/01/22 1956  WBC 8.6  NEUTROABS 5.9  HGB 13.7  HCT 40.8  MCV 89.9  PLT 219   UDS positive for tricyclic, opiates, benzos (administered by EMS)  Lacosamide level pending at time of this note  I have reviewed the images obtained:  Head CT personally reviewed, agree with radiology no acute intracranial process  Chest x-ray negative   Impression: Spell concerning for breakthrough seizure, now returned to baseline.  Recommendations: -Continue  Vimpat 150 mg twice daily -Close outpatient follow-up with Dr. Manuella Ghazi including follow-up of lacosamide level and referral to EMU -No further inpatient neurological work-up needed at this time, no indication to transfer to [facility] at this time -Patient is interested in potential expedited EMU stay at [facility] if there is a delay in getting in to [facility], will discuss with Dr. Sula Soda MD-PhD [facility] [phone] [facility] coverage for [facility] is from 8 AM to 4 AM in-house and 4 PM to 8 PM by telephone/video. 8 PM to 8 AM emergent questions or overnight urgent questions should be addressed to Teleneurology On-call or [facility] neuro[facility]ist; contact information can be found on AMION

## 2022-03-02 NOTE — ED Notes (Signed)
Called to check on bed status to [facility] spoke to Lauren  no bed assignment pending 0830

## 2022-03-02 NOTE — ED Provider Notes (Signed)
Patient seen and evaluated this morning by neurology, Dr. Curly Shores, who recommends starting Vimpat 150 mg and discharged with close outpatient follow-up with Dr. Manuella Ghazi.  I reassessed the patient and she is back to her baseline.  She looks well and husband at the bedside agrees that she seems normal.  She is comfortable with this plan of care and we discussed this increased dose of lacosamide as well as close return precautions for the ED.  We will provide the first dose here and plan for outpatient management.   Vladimir Crofts, MD 03/02/22 [phone]

## 2022-03-04 LAB — LACOSAMIDE: Lacosamide: 5 ug/mL (ref 5.0–10.0)

## 2022-04-22 DIAGNOSIS — K921 Melena: Secondary | ICD-10-CM | POA: Insufficient documentation

## 2022-05-16 ENCOUNTER — Other Ambulatory Visit: Payer: Self-pay

## 2022-05-16 ENCOUNTER — Emergency Department: Payer: Medicare Other

## 2022-05-16 DIAGNOSIS — Z5321 Procedure and treatment not carried out due to patient leaving prior to being seen by health care provider: Secondary | ICD-10-CM | POA: Insufficient documentation

## 2022-05-16 DIAGNOSIS — R079 Chest pain, unspecified: Secondary | ICD-10-CM | POA: Insufficient documentation

## 2022-05-16 LAB — CBC
HCT: 39.6 % (ref 36.0–46.0)
Hemoglobin: 13.4 g/dL (ref 12.0–15.0)
MCH: 30.1 pg (ref 26.0–34.0)
MCHC: 33.8 g/dL (ref 30.0–36.0)
MCV: 89 fL (ref 80.0–100.0)
Platelets: 331 10*3/uL (ref 150–400)
RBC: 4.45 MIL/uL (ref 3.87–5.11)
RDW: 13.2 % (ref 11.5–15.5)
WBC: 11.2 10*3/uL — ABNORMAL HIGH (ref 4.0–10.5)
nRBC: 0 % (ref 0.0–0.2)

## 2022-05-16 LAB — BASIC METABOLIC PANEL
Anion gap: 12 (ref 5–15)
BUN: 14 mg/dL (ref 6–20)
CO2: 30 mmol/L (ref 22–32)
Calcium: 7.9 mg/dL — ABNORMAL LOW (ref 8.9–10.3)
Chloride: 100 mmol/L (ref 98–111)
Creatinine, Ser: 1.11 mg/dL — ABNORMAL HIGH (ref 0.44–1.00)
GFR, Estimated: 59 mL/min — ABNORMAL LOW (ref >60)
Glucose, Bld: 122 mg/dL — ABNORMAL HIGH (ref 70–99)
Potassium: 3.2 mmol/L — ABNORMAL LOW (ref 3.5–5.1)
Sodium: 142 mmol/L (ref 135–145)

## 2022-05-16 LAB — TROPONIN I (HIGH SENSITIVITY): Troponin I (High Sensitivity): 4 ng/L (ref <18)

## 2022-05-16 NOTE — ED Triage Notes (Signed)
Pt presents via POV c/o chest pain starting apx 2 hr PTA. Reports pacemakers alerted her that HR was low, denies firing of pacemaker.

## 2022-05-17 ENCOUNTER — Emergency Department
Admission: EM | Admit: 2022-05-17 | Discharge: 2022-05-17 | Discharge status: Against Medical Advice | Payer: Medicare Other | Attending: Emergency Medicine | Admitting: Emergency Medicine

## 2022-05-17 NOTE — ED Notes (Signed)
No answer when called several times from lobby 

## 2022-05-17 NOTE — ED Notes (Signed)
No answer when called on phone number provided

## 2022-05-17 NOTE — ED Notes (Signed)
No answer in lobby for repeat vital signs

## 2022-05-17 NOTE — ED Notes (Signed)
No answer when called several times from lobby; no answer when phone # listed in chart called 

## 2022-05-23 DIAGNOSIS — C50919 Malignant neoplasm of unspecified site of unspecified female breast: Secondary | ICD-10-CM | POA: Insufficient documentation

## 2022-05-23 IMAGING — CT CT L SPINE W/O CM
3 of 5 series · 14 of 35 positions shown, 16 images · non-contrast
Comparison: CT Abdomen and Pelvis 02/26/2021.

CLINICAL DATA: 54-year-old female with low back pain radiating to
both legs. No known injury.

EXAM:
CT LUMBAR SPINE WITHOUT CONTRAST
TECHNIQUE: Multidetector CT imaging of the lumbar spine was performed without
intravenous contrast administration. Multiplanar CT image
reconstructions were also generated.

[Series 6: sagittal st · sagittal · 0.40mm/px · 6 of 214 slices shown]
[im 31/214  bone]
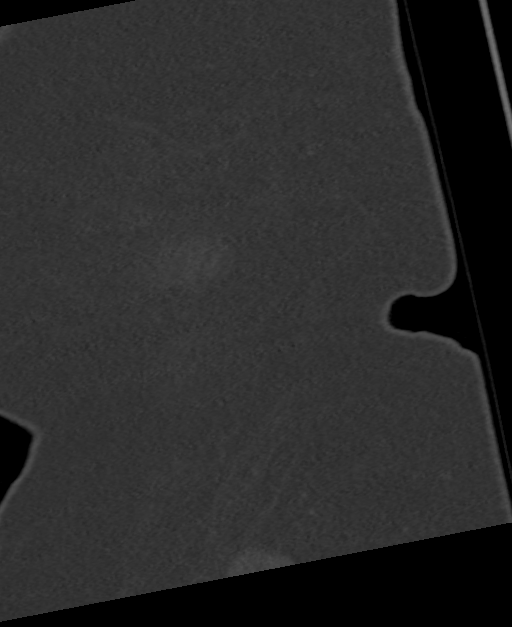
[im 61/214  bone]
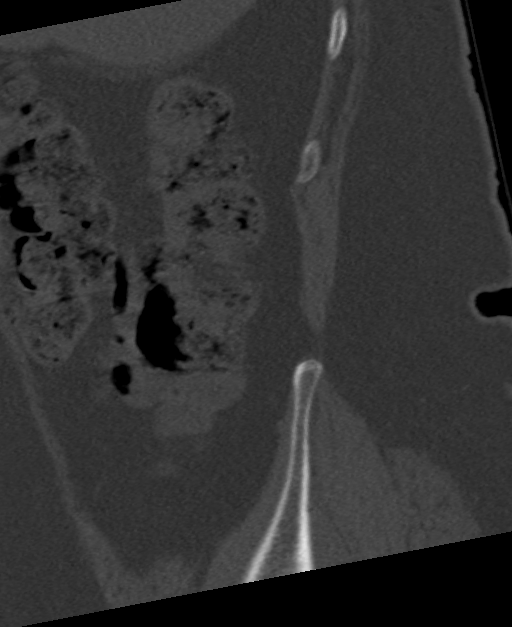
[im 78/214  soft-tissue]
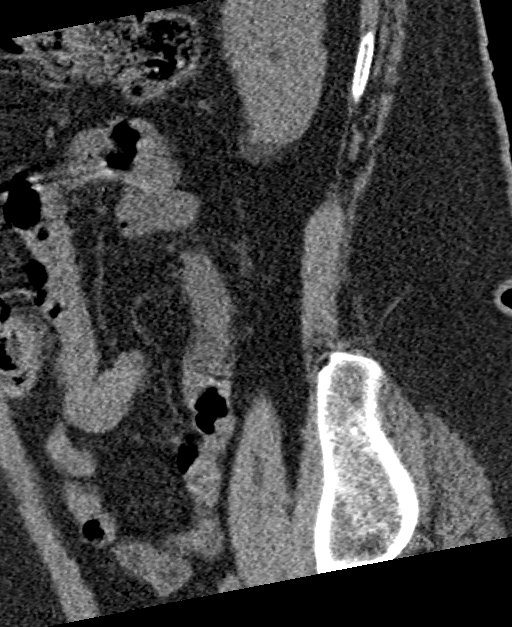
[im 92/214  bone]
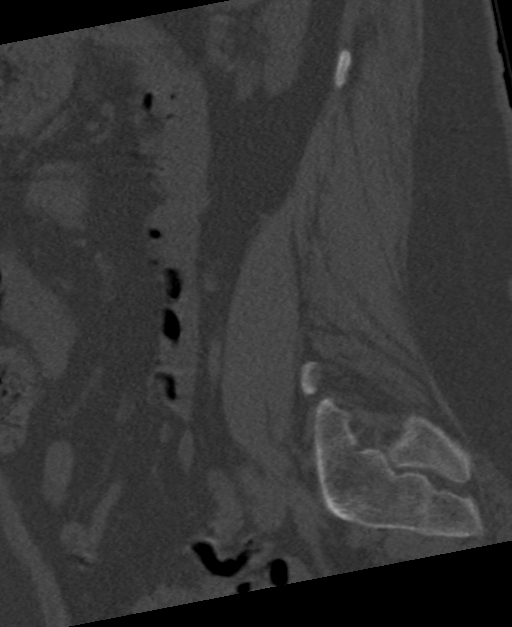
[im 122/214  bone]
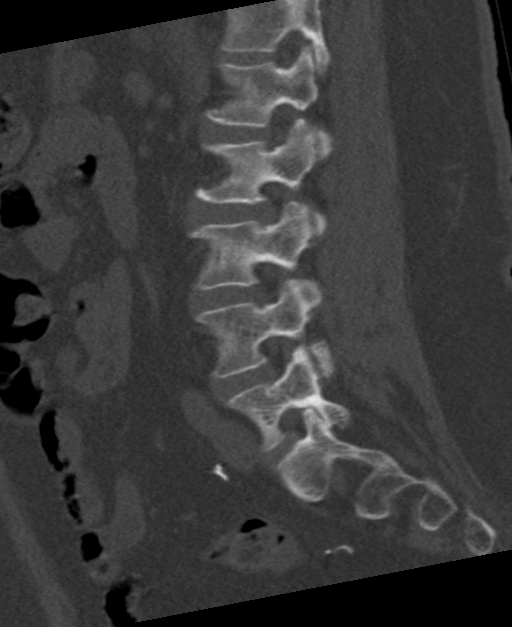
[im 153/214  bone]
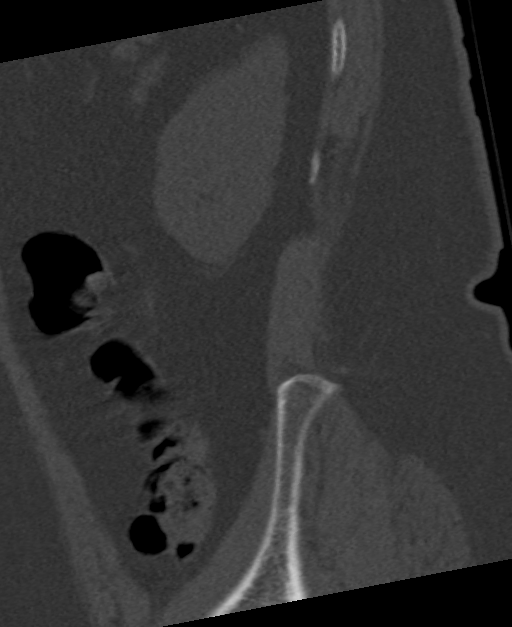

[Series 7: coronal bone · coronal · 0.47mm/px · 3 of 95 slices shown]
[im 19/95  bone]
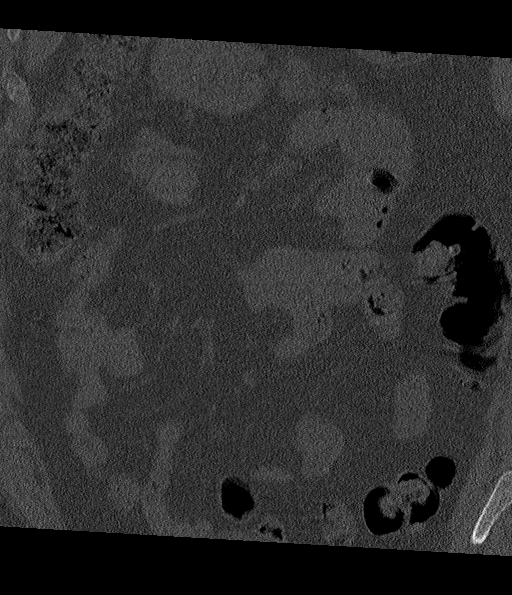
[im 38/95  bone]
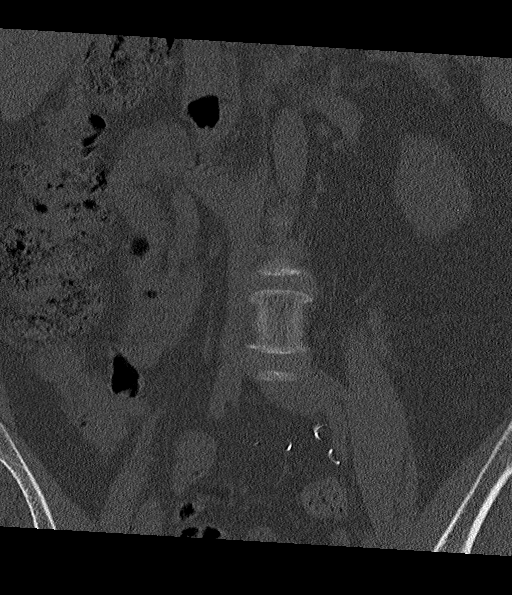
[im 57/95  bone]
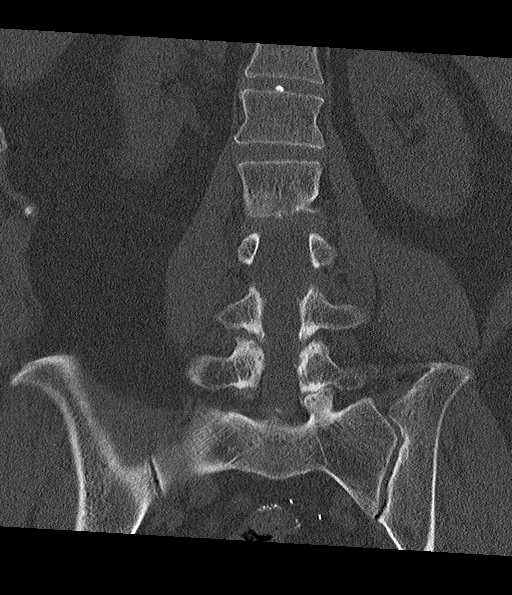

[Series 8: multi disc · axial · 0.28mm/px · z∈[-930,-749]mm · 5 of 111 slices shown, 7 images]
[im 19/111  soft-tissue]
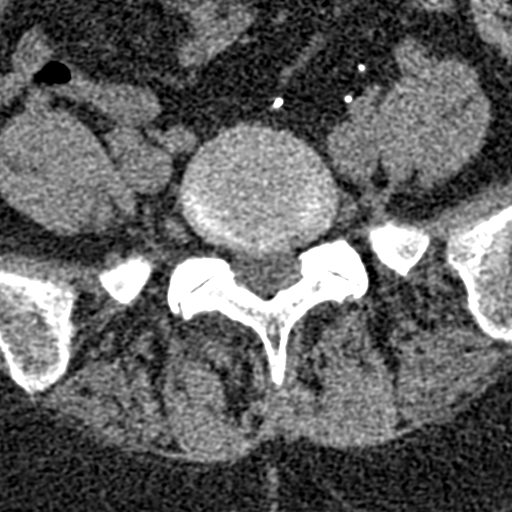
[im 19/111  bone]
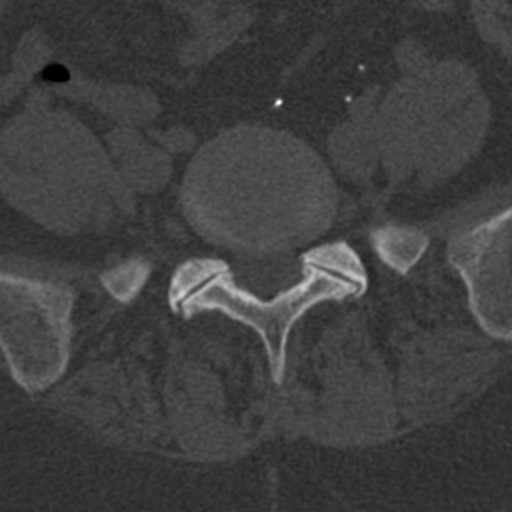
[im 37/111  bone]
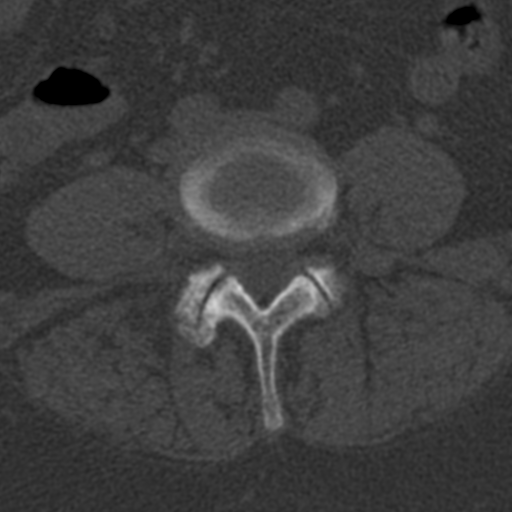
[im 56/111  bone]
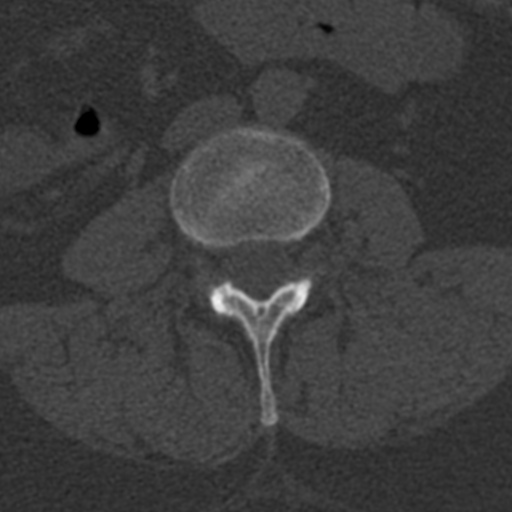
[im 74/111  bone]
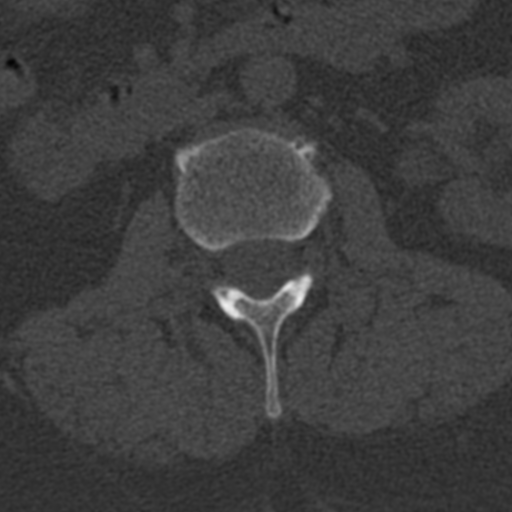
[im 92/111  soft-tissue]
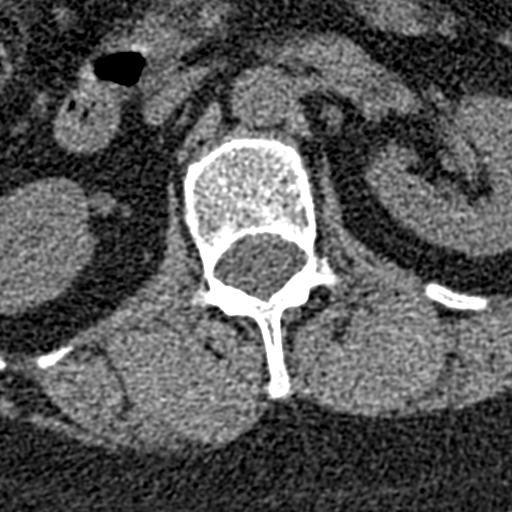
[im 92/111  bone]
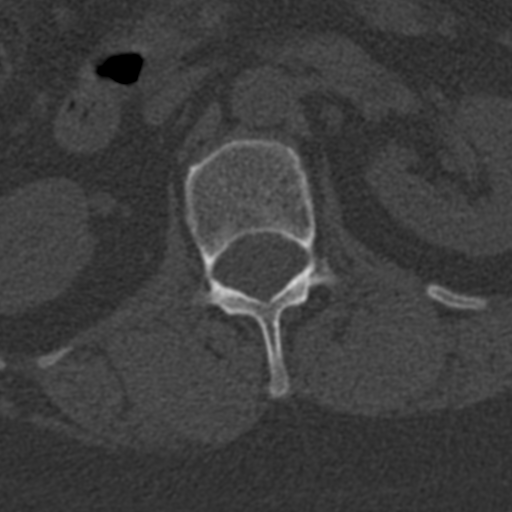

[14 of 35 positions shown; findings below may reference images not displayed]

FINDINGS: Segmentation: Normal.

Alignment: Preserved lumbar lordosis.

Vertebrae: Osteopenia. Intact lumbar vertebrae. Visible lower
thoracic levels appear intact. Intact visible sacrum and SI joints.
No acute or suspicious osseous lesion.

Paraspinal and other soft tissues: Stable and largely negative
visible noncontrast abdominal and pelvic viscera; chronic
anastomosis in the sigmoid colon redemonstrated.

Lumbar paraspinal soft tissues appear negative.

Disc levels:

T12-L1:  Negative.

L1-L2: Mild disc space loss, circumferential disc bulge and endplate
spurring. No stenosis.

L2-L3: Circumferential disc bulge eccentric to the left. Mild
endplate spurring. No definite spinal or lateral recess stenosis.
Mild to moderate left L2 foraminal stenosis.

L3-L4: Mild circumferential disc bulge. Mild facet and ligament
flavum hypertrophy. No significant spinal stenosis. Mild to moderate
right L3 foraminal stenosis related to broad-based disc and
endplate.

L4-L5: Mild circumferential disc bulge. Moderate to severe facet
hypertrophy greater on the right with vacuum facet. Mild right L4
foraminal stenosis.

L5-S1: Mild disc bulging and endplate spurring. Mild facet
hypertrophy. No significant stenosis.
IMPRESSION: 1. Osteopenia, with no acute or suspicious osseous abnormality in
the lumbar spine.
2. Lumbar disc and endplate degeneration. Moderate to severe facet
degeneration at L4-L5 greater on the right.
No significant spinal stenosis by CT.
Mild or moderate neural foraminal stenosis at the left L2, right L3,
and right L4 nerve levels.

## 2022-06-26 ENCOUNTER — Emergency Department
Admission: EM | Admit: 2022-06-26 | Discharge: 2022-06-26 | Disposition: A | Discharge status: Home / Self Care | Payer: Medicare Other | Attending: Emergency Medicine | Admitting: Emergency Medicine

## 2022-06-26 ENCOUNTER — Emergency Department: Payer: Medicare Other

## 2022-06-26 DIAGNOSIS — Z96652 Presence of left artificial knee joint: Secondary | ICD-10-CM | POA: Diagnosis not present

## 2022-06-26 DIAGNOSIS — G8911 Acute pain due to trauma: Secondary | ICD-10-CM | POA: Insufficient documentation

## 2022-06-26 DIAGNOSIS — W19XXXA Unspecified fall, initial encounter: Secondary | ICD-10-CM | POA: Diagnosis not present

## 2022-06-26 DIAGNOSIS — M25562 Pain in left knee: Secondary | ICD-10-CM | POA: Insufficient documentation

## 2022-06-26 MED ORDER — OXYCODONE-ACETAMINOPHEN 5-325 MG PO TABS
1.0000 | ORAL_TABLET | ORAL | Status: DC | PRN
  Administered 2022-06-26: 1 via ORAL
  Filled 2022-06-26: qty 1

## 2022-06-26 NOTE — ED Provider Notes (Signed)
[facility] Provider Note  Patient Contact: 11:18 PM (approximate)   History   Knee Pain   HPI  Brittany Nguyen is a 56 y.o. female presents to the emergency department with left knee pain after a fall.  Patient anticipates having a knee replacement November 2.  She has been able to bear weight with some pain since injury occurred.  No numbness or tingling in the bilateral lower extremities.      Physical Exam   Triage Vital Signs: ED Triage Vitals  Enc Vitals Group     BP 06/26/22 1917 117/87     Pulse Rate 06/26/22 1917 81     Resp 06/26/22 1917 19     Temp 06/26/22 1917 98.3 F (36.8 C)     Temp Source 06/26/22 1917 Oral     SpO2 06/26/22 1917 99 %     Weight 06/26/22 1918 189 lb (85.7 kg)     Height 06/26/22 1918 '5\' 5"'$  (1.651 m)     Head Circumference --      Peak Flow --      Pain Score 06/26/22 1918 9     Pain Loc --      Pain Edu? --      Excl. in [place]? --     Most recent vital signs: Vitals:   06/26/22 1917 06/26/22 2128  BP: 117/87   Pulse: 81 77  Resp: 19 18  Temp: 98.3 F (36.8 C)   SpO2: 99% 100%     General: Alert and in no acute distress. Eyes:  PERRL. EOMI. Head: No acute traumatic findings ENT:      Nose: No congestion/rhinnorhea.      Mouth/Throat: Mucous membranes are moist. Neck: No stridor. No cervical spine tenderness to palpation. Cardiovascular:  Good peripheral perfusion Respiratory: Normal respiratory effort without tachypnea or retractions. Lungs CTAB. Good air entry to the bases with no decreased or absent breath sounds. Gastrointestinal: Bowel sounds 4 quadrants. Soft and nontender to palpation. No guarding or rigidity. No palpable masses. No distention. No CVA tenderness. Musculoskeletal: Full range of motion to all extremities.  Neurologic:  No gross focal neurologic deficits are appreciated.  Skin:   No rash noted Other:   ED Results / Procedures / Treatments   Labs (all labs ordered are  listed, but only abnormal results are displayed) Labs Reviewed - No data to display      RADIOLOGY  I personally viewed and evaluated these images as part of my medical decision making, as well as reviewing the written report by the radiologist.  ED Provider Interpretation: No acute bony abnormality visualized on x-ray of the left knee.   PROCEDURES:  Critical Care performed: No  Procedures   MEDICATIONS ORDERED IN ED: Medications  oxyCODONE-acetaminophen (PERCOCET/ROXICET) 5-325 MG per tablet 1 tablet (1 tablet Oral Given 06/26/22 1923)     IMPRESSION / MDM / ASSESSMENT AND PLAN / ED COURSE  I reviewed the triage vital signs and the nursing notes.                              Assessment and plan Knee pain 56 year old female presents to the emergency department with acute left knee pain after a mechanical fall.  No acute bony abnormality was visualized on x-ray.  Patient takes fentanyl patches and IV morphine for chronic pain.  Recommended follow-up with orthopedics as needed.     FINAL CLINICAL IMPRESSION(S) /  ED DIAGNOSES   Final diagnoses:  Acute pain of left knee     Rx / DC Orders   ED Discharge Orders     None        Note:  This document was prepared using Dragon voice recognition software and may include unintentional dictation errors.   Vallarie Mare [place], PA-C 06/26/22 2321    Arta Silence, MD 06/27/22 [phone]

## 2022-06-26 NOTE — ED Notes (Signed)
Signing pad did not work, pt verbalized understanding of DC instructions. 

## 2022-06-26 NOTE — ED Triage Notes (Signed)
Left knee pain. Schedule to have knee replacement to same knee in near future. Has been using walker to get around. Pt states fell onto left knee earlier this evening. Felt like " it gave out."  Pt denies any syncopal episode prior to fall. Denies any other injury.

## 2022-07-30 ENCOUNTER — Emergency Department: Payer: Medicare Other

## 2022-07-30 ENCOUNTER — Observation Stay
Admission: EM | Admit: 2022-07-30 | Discharge: 2022-08-01 | Disposition: A | Discharge status: Home / Self Care | Payer: Medicare Other | Attending: Family Medicine | Admitting: Family Medicine

## 2022-07-30 ENCOUNTER — Other Ambulatory Visit: Payer: Self-pay

## 2022-07-30 DIAGNOSIS — I5042 Chronic combined systolic (congestive) and diastolic (congestive) heart failure: Secondary | ICD-10-CM | POA: Diagnosis not present

## 2022-07-30 DIAGNOSIS — E039 Hypothyroidism, unspecified: Secondary | ICD-10-CM

## 2022-07-30 DIAGNOSIS — R569 Unspecified convulsions: Principal | ICD-10-CM

## 2022-07-30 DIAGNOSIS — Z86718 Personal history of other venous thrombosis and embolism: Secondary | ICD-10-CM | POA: Insufficient documentation

## 2022-07-30 DIAGNOSIS — Z95810 Presence of automatic (implantable) cardiac defibrillator: Secondary | ICD-10-CM | POA: Insufficient documentation

## 2022-07-30 DIAGNOSIS — Z91040 Latex allergy status: Secondary | ICD-10-CM | POA: Diagnosis not present

## 2022-07-30 DIAGNOSIS — E876 Hypokalemia: Secondary | ICD-10-CM | POA: Diagnosis not present

## 2022-07-30 DIAGNOSIS — Z85020 Personal history of malignant carcinoid tumor of stomach: Secondary | ICD-10-CM | POA: Insufficient documentation

## 2022-07-30 DIAGNOSIS — Z23 Encounter for immunization: Secondary | ICD-10-CM | POA: Diagnosis not present

## 2022-07-30 DIAGNOSIS — I4581 Long QT syndrome: Secondary | ICD-10-CM | POA: Insufficient documentation

## 2022-07-30 DIAGNOSIS — R197 Diarrhea, unspecified: Secondary | ICD-10-CM | POA: Insufficient documentation

## 2022-07-30 DIAGNOSIS — R9431 Abnormal electrocardiogram [ECG] [EKG]: Secondary | ICD-10-CM

## 2022-07-30 DIAGNOSIS — Z79899 Other long term (current) drug therapy: Secondary | ICD-10-CM | POA: Diagnosis not present

## 2022-07-30 DIAGNOSIS — J449 Chronic obstructive pulmonary disease, unspecified: Secondary | ICD-10-CM | POA: Insufficient documentation

## 2022-07-30 DIAGNOSIS — I10 Essential (primary) hypertension: Secondary | ICD-10-CM

## 2022-07-30 DIAGNOSIS — Z6841 Body Mass Index (BMI) 40.0 and over, adult: Secondary | ICD-10-CM | POA: Diagnosis not present

## 2022-07-30 DIAGNOSIS — E662 Morbid (severe) obesity with alveolar hypoventilation: Secondary | ICD-10-CM | POA: Insufficient documentation

## 2022-07-30 DIAGNOSIS — I110 Hypertensive heart disease with heart failure: Secondary | ICD-10-CM | POA: Diagnosis not present

## 2022-07-30 LAB — CBC WITH DIFFERENTIAL/PLATELET
Abs Immature Granulocytes: 0.35 10*3/uL — ABNORMAL HIGH (ref 0.00–0.07)
Basophils Absolute: 0.1 10*3/uL (ref 0.0–0.1)
Basophils Relative: 1 %
Eosinophils Absolute: 0.2 10*3/uL (ref 0.0–0.5)
Eosinophils Relative: 3 %
HCT: 33.9 % — ABNORMAL LOW (ref 36.0–46.0)
Hemoglobin: 11.7 g/dL — ABNORMAL LOW (ref 12.0–15.0)
Immature Granulocytes: 4 %
Lymphocytes Relative: 27 %
Lymphs Abs: 2.2 10*3/uL (ref 0.7–4.0)
MCH: 30.5 pg (ref 26.0–34.0)
MCHC: 34.5 g/dL (ref 30.0–36.0)
MCV: 88.5 fL (ref 80.0–100.0)
Monocytes Absolute: 0.6 10*3/uL (ref 0.1–1.0)
Monocytes Relative: 7 %
Neutro Abs: 4.8 10*3/uL (ref 1.7–7.7)
Neutrophils Relative %: 58 %
Platelets: 188 10*3/uL (ref 150–400)
RBC: 3.83 MIL/uL — ABNORMAL LOW (ref 3.87–5.11)
RDW: 13.6 % (ref 11.5–15.5)
WBC: 8.1 10*3/uL (ref 4.0–10.5)
nRBC: 0 % (ref 0.0–0.2)

## 2022-07-30 LAB — COMPREHENSIVE METABOLIC PANEL
ALT: 38 U/L (ref 0–44)
AST: 35 U/L (ref 15–41)
Albumin: 3.6 g/dL (ref 3.5–5.0)
Alkaline Phosphatase: 93 U/L (ref 38–126)
Anion gap: 8 (ref 5–15)
BUN: 8 mg/dL (ref 6–20)
CO2: 31 mmol/L (ref 22–32)
Calcium: 7.8 mg/dL — ABNORMAL LOW (ref 8.9–10.3)
Chloride: 104 mmol/L (ref 98–111)
Creatinine, Ser: 0.8 mg/dL (ref 0.44–1.00)
GFR, Estimated: >60 mL/min (ref >60)
Glucose, Bld: 131 mg/dL — ABNORMAL HIGH (ref 70–99)
Potassium: 2.6 mmol/L — CL (ref 3.5–5.1)
Sodium: 143 mmol/L (ref 135–145)
Total Bilirubin: 0.5 mg/dL (ref 0.3–1.2)
Total Protein: 6.9 g/dL (ref 6.5–8.1)

## 2022-07-30 LAB — CARBAMAZEPINE LEVEL, TOTAL: Carbamazepine Lvl: <2 ug/mL — ABNORMAL LOW (ref 4.0–12.0)

## 2022-07-30 LAB — MAGNESIUM: Magnesium: 1.9 mg/dL (ref 1.7–2.4)

## 2022-07-30 MED ORDER — POTASSIUM CHLORIDE 10 MEQ/100ML IV SOLN
10.0000 meq | INTRAVENOUS | Status: AC
  Administered 2022-07-30 – 2022-07-31 (×4): 10 meq via INTRAVENOUS
  Filled 2022-07-30: qty 100

## 2022-07-30 MED ORDER — SODIUM CHLORIDE 0.9 % IV SOLN: Freq: Once | INTRAVENOUS | Status: AC

## 2022-07-30 MED ORDER — SODIUM CHLORIDE 0.9 % IV BOLUS
1000.0000 mL | Freq: Once | INTRAVENOUS | Status: AC
  Administered 2022-07-30: 1000 mL via INTRAVENOUS

## 2022-07-30 MED ORDER — POTASSIUM CHLORIDE CRYS ER 20 MEQ PO TBCR
40.0000 meq | EXTENDED_RELEASE_TABLET | Freq: Once | ORAL | Status: AC
  Administered 2022-07-31: 40 meq via ORAL

## 2022-07-30 NOTE — ED Notes (Signed)
Reported critical potassium - 2.6 to MD.

## 2022-07-30 NOTE — ED Provider Notes (Signed)
[facility] Provider Note    Event Date/Time   First MD Initiated Contact with Patient 07/30/22 2132     (approximate)   History   Seizures   HPI  Brittany Nguyen is a 56 y.o. female who presents to the emergency department today after seizure.  Unfortunate the patient is unable to give any history at the time my exam.  Per report the patient was having facial twitching.  She was given a total of 10 mg of Versed by EMS.  Per chart review patient does have a history of seizures.     Physical Exam   Triage Vital Signs: ED Triage Vitals  Enc Vitals Group     BP 07/30/22 2133 112/81     Pulse Rate 07/30/22 2133 88     Resp 07/30/22 2133 16     Temp 07/30/22 2133 98 F (36.7 C)     Temp Source 07/30/22 2133 Oral     SpO2 07/30/22 2133 94 %     Weight 07/30/22 2118 (!) 350 lb (158.8 kg)     Height 07/30/22 2118 '5\' 5"'$  (1.651 m)     Head Circumference --      Peak Flow --      Pain Score 07/30/22 2118 0     Pain Loc --      Pain Edu? --      Excl. in [place]? --     Most recent vital signs: Vitals:   07/30/22 2133  BP: 112/81  Pulse: 88  Resp: 16  Temp: 98 F (36.7 C)  SpO2: 94%   General: Somnolent, responds to physical stimuli CV:  Good peripheral perfusion. Regular rate and rhythm. Resp:  Normal effort. Lungs clear. Abd:  No distention.     ED Results / Procedures / Treatments   Labs (all labs ordered are listed, but only abnormal results are displayed) Labs Reviewed  CARBAMAZEPINE LEVEL, TOTAL - Abnormal; Notable for the following components:      Result Value   Carbamazepine Lvl <2.0 (*)    All other components within normal limits  CBC WITH DIFFERENTIAL/PLATELET - Abnormal; Notable for the following components:   RBC 3.83 (*)    Hemoglobin 11.7 (*)    HCT 33.9 (*)    Abs Immature Granulocytes 0.35 (*)    All other components within normal limits  COMPREHENSIVE METABOLIC PANEL - Abnormal; Notable for the following  components:   Potassium 2.6 (*)    Glucose, Bld 131 (*)    Calcium 7.8 (*)    All other components within normal limits  MAGNESIUM  POC URINE PREG, ED     EKG  I, Nance Pear, attending physician, personally viewed and interpreted this EKG  EKG Time: 2124 Rate: 86 Rhythm: sinus rhythm Axis: left axis deviation Intervals: qtc 539 QRS: LAFB ST changes: no st elevation Impression: abnormal ekg    RADIOLOGY I independently interpreted and visualized the CT head. My interpretation: No bleed Radiology interpretation:  IMPRESSION:  No acute intracranial abnormality.     PROCEDURES:  Critical Care performed: No  Procedures   MEDICATIONS ORDERED IN ED: Medications - No data to display   IMPRESSION / MDM / [place] / ED COURSE  I reviewed the triage vital signs and the nursing notes.                              Differential diagnosis  includes, but is not limited to, breakthrough seizure, electrolyte abnormality, intracranial process, infection.  Patient's presentation is most consistent with acute presentation with potential threat to life or bodily function.  The patient is on the cardiac monitor to evaluate for evidence of arrhythmia and/or significant heart rate changes.  Patient presented to the emergency department today because of concerns for seizure. Patient has history of seizure. Not seizing at time of arrival, however patient is altered which is expected given medication instruction. Head ct negative. Blood work does show hypokalemia. Magnesium level was normal. Patient did become more awake while here in the emergency department. She states that she has not been feeling well over the past few days, primarily with diarrhea. This could explain the patient's low potassium. Additionally patient's blood pressure did start going down which I think is likely secondary to dehydration. Will plan on starting IV fluids. Discussed with Dr. Josephine Cables with the  hospitalist service who will plan on admission.      FINAL CLINICAL IMPRESSION(S) / ED DIAGNOSES   Final diagnoses:  Seizure ([place])  Hypokalemia      Note:  This document was prepared using Dragon voice recognition software and may include unintentional dictation errors.    Nance Pear, MD 07/30/22 [phone]

## 2022-07-30 NOTE — ED Triage Notes (Signed)
BIB by [facility] from home. Pt husband advised pt was seizing at home. Came out as cardiac arrest. EMS witnessed seizure. Focal seizure. Facial twitching.  IM '5mg'$  of versed. Moved to unit then twitching returned. IV versed 5 mg given. Pt unresponsive at this time.  103/ 100 HR  BGL 178 Pt oxygen sat dropped after versed to 88%. placed on NRB and returned to 100%. Now on room air.

## 2022-07-30 NOTE — H&P (Addendum)
History and Physical    Patient: Brittany Nguyen JOA:[id number] DOB: 1966-06-11 DOA: 07/30/2022 DOS: the patient was seen and examined on 07/31/2022 PCP: [name], MD  Patient coming from: Home  Chief Complaint:  Chief Complaint  Patient presents with   Seizures   HPI: Brittany Nguyen is a 56 y.o. female with medical history significant of chronic combined systolic and diastolic dysfunction CHF, gastric neuroendocrine tumor, nonischemic cardiomyopathy status post AICD placement, GERD, hypothyroidism, OSA, seizures and obesity who presents to the emergency department via EMS due to seizures.  Patient states that she has been having diarrhea since last several days and while at the dinner table this evening, she presented with seizures.  EMS was activated and on arrival of EMS team, she was noted with focal seizure and facial twitching, so IM 5 mg of Versed was given, on arrival to the ED, she had another episode of twitching on arrival to the ED and IV Versed 5 mg was given and she presented with O2 desaturation of 88%, NRB was placed with O2 sat returning to 100% and on room air.  She denies fever, chills, chest pain, shortness of breath.  ED Course:  In the emergency department, she was hemodynamically stable, though BP was soft at 112/81.  Workup in the ED showed normocytic anemia, BMP was normal except for hypokalemia at 2.6, blood glucose 131, calcium 7.8,, cefepime was less than 0, magnesium 1.9. CT head without contrast showed no acute intracranial abnormality IV hydration was provided, potassium was replenished.  Hospitalist was asked to admit patient for further evaluation and management.  Review of Systems: Review of systems as noted in the HPI. All other systems reviewed and are negative.   Past Medical History:  Diagnosis Date   Asthma 2013   Atrial fibrillation ([place])    C. 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    CHF  (congestive heart failure) ([place])    COPD (chronic obstructive pulmonary disease) ([place])    Diverticulitis 2010   DVT (deep venous thrombosis) ([place])    Endometriosis 1990   GIB (gastrointestinal bleeding) 01/08/2018   Heart disease 2013   Hx MRSA infection    Iron deficiency    Iron deficiency anemia 03/27/2018   Lump or mass in breast    Restless leg    Seizures (HCC)    Stomach cancer (HCC)    Past Surgical History:  Procedure Laterality Date   ABDOMINAL HYSTERECTOMY     age 18   BREAST BIOPSY Right 2014   benign   CARDIAC DEFIBRILLATOR PLACEMENT     CARDIAC DEFIBRILLATOR PLACEMENT     CESAREAN SECTION     CHOLECYSTECTOMY     COLECTOMY     COLONOSCOPY WITH PROPOFOL N/A 02/22/2018   Procedure: COLONOSCOPY WITH PROPOFOL;  Surgeon: Toledo, Benay Pike, MD;  Location: [facility] ENDOSCOPY;  Service: Gastroenterology;  Laterality: N/A;   ESOPHAGOGASTRODUODENOSCOPY (EGD) WITH PROPOFOL N/A 02/22/2018   Procedure: ESOPHAGOGASTRODUODENOSCOPY (EGD) WITH PROPOFOL;  Surgeon: Toledo, Benay Pike, MD;  Location: [facility] ENDOSCOPY;  Service: Gastroenterology;  Laterality: N/A;   NASAL SINUS SURGERY  2012   OOPHORECTOMY      Social History:  reports that she has never smoked. She has never used smokeless tobacco. She reports current drug use. She reports that she does not drink alcohol.   Allergies  Allergen Reactions   Contrast Media [Iodinated Contrast Media] Shortness Of Breath    Per CT report in 2014 pt  had break through contrast reaction with hives and SOB following 13 hour prep. MSY    Lidocaine Hives   Metrizamide Shortness Of Breath   Penicillins Hives and Other (See Comments)    ediate rash, facial/tongue/throat swelling, SOB or lightheadedness with hypotension, tachy   Sacubitril-Valsartan     Sz (unclear if new start entresto vs hypoglycemia)   Isosorbide Nitrate Other (See Comments)    Headache   Latex Hives   Ondansetron Other (See Comments)    Severe headache   Tizanidine Other (See  Comments)    Feels altered   Povidone-Iodine Rash    Blistering rash   Pulmicort [Budesonide] Itching    Family History  Problem Relation Age of Onset   Cancer Mother 56       ovarian   CAD Mother    Cancer Father 75       brain   CAD Father    Cancer Daughter 72       skin   Cancer Maternal Aunt 64       breast   Leukemia Paternal Grandfather      Prior to Admission medications   Medication Sig Start Date End Date Taking? Authorizing Provider  cyclobenzaprine (FLEXERIL) 5 MG tablet Take 5 mg by mouth 3 (three) times daily as needed for muscle spasms. 07/01/21   [provider]  escitalopram (LEXAPRO) 10 MG tablet Take 10 mg by mouth daily. 12/30/21   [provider]  fentaNYL (DURAGESIC) 75 MCG/HR Place 1 patch onto the skin every 3 (three) days. 01/12/22   [provider]  furosemide (LASIX) 40 MG tablet Take 2 tablets by mouth 2 (two) times daily. 02/17/22   [provider]  gabapentin (NEURONTIN) 100 MG capsule Take 100 mg by mouth at bedtime. 06/01/21   [provider]  lacosamide 150 MG TABS Take 1 tablet (150 mg total) by mouth 2 (two) times daily. 01/17/22   Max Sane, MD  levETIRAcetam (KEPPRA) 500 MG tablet Take 500 mg by mouth 2 (two) times daily. 01/25/22   [provider]  levothyroxine (SYNTHROID) 175 MCG tablet Take 175 mcg by mouth every morning. 12/17/21   [provider]  LORazepam (ATIVAN) 1 MG tablet Take 1 tablet (1 mg total) by mouth 4 (four) times daily as needed. Patient taking differently: Take 1 mg by mouth 4 (four) times daily as needed (stomach cramps or anxiety). 11/10/21   Lorella Nimrod, MD  losartan (COZAAR) 25 MG tablet Take 25 mg by mouth every morning. 01/17/22   [provider]  metoprolol succinate (TOPROL-XL) 50 MG 24 hr tablet Take 50 mg by mouth 2 (two) times daily. 02/16/22   [provider]  morphine (MSIR) 15 MG tablet Take by mouth. 02/17/22   [provider]   naloxone [facility]) nasal spray 4 mg/0.1 mL Place 1 spray into the nose as needed for opioid reversal. May repeat once in opposite nare. 10/05/21   [provider]  nitroGLYCERIN (NITROSTAT) 0.4 MG SL tablet Place 0.4 mg under the tongue every 5 (five) minutes x 3 doses as needed for chest pain. 11/23/21   [provider]  Oxycodone HCl 10 MG TABS Take 10 mg by mouth as directed. mg Take 1 tablet (10 mg total) by mouth every 4 (four) hours as needed for Pain for up to 20 days 12/30/21   [provider]  oxyCODONE-acetaminophen (PERCOCET/ROXICET) 5-325 MG tablet Take 1-2 tablets by mouth every 4 (four) hours as needed (breakthrough  pain). 01/17/20   [provider]  Potassium Chloride ER 20 MEQ TBCR Take 1 tablet by mouth in the morning and at bedtime. 02/16/22   [provider]  predniSONE (STERAPRED UNI-PAK 21 TAB) 10 MG (21) TBPK tablet Take by mouth. 02/28/22   [provider]  promethazine (PHENERGAN) 12.5 MG suppository Place 1 suppository (12.5 mg total) rectally daily as needed for nausea or vomiting. 12/16/21   Fritzi Mandes, MD  promethazine (PHENERGAN) 25 MG tablet Take 25 mg by mouth every 6 (six) hours as needed for nausea/vomiting. 12/13/21   [provider]  rOPINIRole (REQUIP) 4 MG tablet Take 4 mg by mouth at bedtime. 01/30/19   [provider]    Physical Exam: BP 104/71   Pulse 73   Temp 98 F (36.7 C) (Oral)   Resp 14   Ht '5\' 5"'$  (1.651 m)   Wt (!) 158.8 kg   LMP 01/22/2012 (Approximate) Comment: Hysterectomy 5 years ago  SpO2 100%   BMI 58.24 kg/m   General: 56 y.o. year-old female well developed well nourished in no acute distress.  Alert and oriented x3. HEENT: NCAT, EOMI. Neck: Supple, trachea medial Cardiovascular: Regular rate and rhythm with no rubs or gallops.  No thyromegaly or JVD noted.  No lower extremity edema. 2/4 pulses in all 4 extremities. Respiratory: Clear to auscultation with no wheezes or  rales. Good inspiratory effort. Abdomen: Soft, nontender nondistended with normal bowel sounds x4 quadrants. Muskuloskeletal: No cyanosis, clubbing or edema noted bilaterally Neuro: CN II-XII intact, strength 5/5 x 4, sensation, reflexes intact Skin: No ulcerative lesions noted or rashes Psychiatry: Judgement and insight appear normal. Mood is appropriate for condition and setting          Labs on Admission:  Basic Metabolic Panel: Recent Labs  Lab 07/30/22 2132  NA 143  K 2.6*  CL 104  CO2 31  GLUCOSE 131*  BUN 8  CREATININE 0.80  CALCIUM 7.8*  MG 1.9   Liver Function Tests: Recent Labs  Lab 07/30/22 2132  AST 35  ALT 38  ALKPHOS 93  BILITOT 0.5  PROT 6.9  ALBUMIN 3.6   No results for input(s): "LIPASE", "AMYLASE" in the last 168 hours. No results for input(s): "AMMONIA" in the last 168 hours. CBC: Recent Labs  Lab 07/30/22 2127  WBC 8.1  NEUTROABS 4.8  HGB 11.7*  HCT 33.9*  MCV 88.5  PLT 188   Cardiac Enzymes: No results for input(s): "CKTOTAL", "CKMB", "CKMBINDEX", "TROPONINI" in the last 168 hours.  BNP (last 3 results) No results for input(s): "BNP" in the last 8760 hours.  ProBNP (last 3 results) No results for input(s): "PROBNP" in the last 8760 hours.  CBG: No results for input(s): "GLUCAP" in the last 168 hours.  Radiological Exams on Admission: CT Head Wo Contrast  Result Date: 07/30/2022 CLINICAL DATA:  seizure EXAM: CT HEAD WITHOUT CONTRAST TECHNIQUE: Contiguous axial images were obtained from the base of the skull through the vertex without intravenous contrast. RADIATION DOSE REDUCTION: This exam was performed according to the departmental dose-optimization program which includes automated exposure control, adjustment of the mA and/or kV according to patient size and/or use of iterative reconstruction technique. COMPARISON:  CT head 03/01/2022 FINDINGS: Brain: No evidence of large-territorial acute infarction. No parenchymal hemorrhage. No  mass lesion. No extra-axial collection. No mass effect or midline shift. No hydrocephalus. Basilar cisterns are patent. Vascular: No hyperdense vessel. Skull: No acute fracture or focal lesion. Sinuses/Orbits: Left sphenoid sinus  mucosal thickening. Otherwise paranasal sinuses and mastoid air cells are clear. The orbits are unremarkable. Other: None. IMPRESSION: No acute intracranial abnormality. Electronically Signed   By: Iven Finn M.D.   On: 07/30/2022 22:00    EKG: I independently viewed the EKG done and my findings are as followed: Normal sinus rhythm at a rate of 86 bpm with prolonged PR interval and QTc 539 ms  Assessment/Plan Present on Admission:  Hypokalemia  Prolonged QT interval  Acquired hypothyroidism  Principal Problem:   Seizures (HCC) Active Problems:   Hypokalemia   Acquired hypothyroidism   Prolonged QT interval   Hypocalcemia   Diarrhea   Essential hypertension   Morbid obesity with body mass index (BMI) of 50.0 to 59.9 in adult [facility])  Acute on chronic seizures Patient has electrolyte abnormalities including hypocalcemia which could have contributed to the twitching presented by patient Continue Os-Cal Containing on lacosamide, gabapentin and Keppra EEG in the morning Continue seizure precaution Consider neurology consult based on EEG findings  Hypokalemia K+ is 2.6 K+ will be replenished Please monitor for AM K+ for further replenishmemnt  Hypocalcemia Corrected calcium level at 8.1 Continue Os-Cal  Prolonged QT interval QTc 539 ms Avoid QT prolonging drugs K+ 2.6, this will be replenished Magnesium 1.9, this will be replenished to attain a goal of 2 Repeat EKG in the morning   Combined systolic and diastolic CHF Patient does not appear to be in acute exacerbation of CHF at this time Continue total input/output, daily weights and fluid restriction Continue heart healthy diet  Most recent echocardiogram in Care Everywhere. Summary   1. The  left ventricle is normal in size with normal wall thickness.    2. The left ventricular systolic function is severely decreased, LVEF is visually estimated at 25-30%.    3. There is grade I diastolic dysfunction (impaired relaxation).    4. The right ventricle is normal in size, with normal systolic function.  Home meds temporarily held due to soft BP  Malignant carcinoid tumor of stomach Patient follows with [place] gastroenterology team and [facility] hematology oncology Johnnye Sima, [place])  Acquired hypothyroidism Continue Synthroid  Essential hypertension BP meds will be held at this time due to soft BP  Morbid obesity (BMI 58.24) Diet and lifestyle modification  DVT prophylaxis: Lovenox  Code Status: Full code  Family Communication: None at bedside  Consults: None  Severity of Illness: The appropriate patient status for this patient is OBSERVATION. Observation status is judged to be reasonable and necessary in order to provide the required intensity of service to ensure the patient's safety. The patient's presenting symptoms, physical exam findings, and initial radiographic and laboratory data in the context of their medical condition is felt to place them at decreased risk for further clinical deterioration. Furthermore, it is anticipated that the patient will be medically stable for discharge from the hospital within 2 midnights of admission.   Author: Bernadette Hoit, DO 07/31/2022 12:44 AM  For on call review [email or web address].

## 2022-07-30 NOTE — H&P (Incomplete)
History and Physical    Patient: Brittany Nguyen UMP:[id number] DOB: Aug 08, 1966 DOA: 07/30/2022 DOS: the patient was seen and examined on 07/30/2022 PCP: [name], MD  Patient coming from: Home  Chief Complaint:  Chief Complaint  Patient presents with  . Seizures   HPI: Brittany Nguyen is a 56 y.o. female with medical history significant of chronic combined systolic and diastolic dysfunction CHF (last known LVEF of 35 - 40% from a 2D echocardiogram which was done 10/21), gastric neuroendocrine tumor, nonischemic cardiomyopathy status post AICD placement, GERD, hypothyroidism, OSA, seizures and obesity who presents to the emergency department via EMS due to seizures.  Patient states that she has been having diarrhea since last several days and she presented with seizures at home today.  EMS was activated and on arrival of EMS team, she was noted with focal seizure and facial twitching, so IM 5 mg of Versed was given, on arrival to the ED, she had another episode of twitching and IV Versed 5 mg was given and she presented with O2 sat desaturation of 88%, NRB was placed with O2 sat returning to 100% and on room air.  She denies fever, chills, chest pain, shortness of breath.  ED Course:  In the emergency department, she was hemodynamically stable, though BP was soft at 112/81.  Workup in the ED showed normocytic anemia, BMP was normal except for hypokalemia at 2.6, blood glucose 131, calcium 7.8,, cefepime was less than 0, magnesium 1.9. CT head without contrast showed no acute intracranial abnormality IV hydration was provided, potassium was replenished.  Hospitalist was asked to admit patient for further evaluation and management.  Review of Systems: Review of systems as noted in the HPI. All other systems reviewed and are negative.   Past Medical History:  Diagnosis Date  . Asthma 2013  . Atrial fibrillation ([place])   . C. difficile colitis 01/14/2018  . 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 CHF (congestive heart failure) ([place])   . COPD (chronic obstructive pulmonary disease) ([place])   . Diverticulitis 2010  . DVT (deep venous thrombosis) ([place])   . Endometriosis 1990  . GIB (gastrointestinal bleeding) 01/08/2018  . Heart disease 2013  . Hx MRSA infection   . Iron deficiency   . Iron deficiency anemia 03/27/2018  . Lump or mass in breast   . Restless leg   . Seizures ([place])   . Stomach cancer [facility])    Past Surgical History:  Procedure Laterality Date  . ABDOMINAL HYSTERECTOMY     age 61  . BREAST BIOPSY Right 2014   benign  . CARDIAC DEFIBRILLATOR PLACEMENT    . CARDIAC DEFIBRILLATOR PLACEMENT    . CESAREAN SECTION    . CHOLECYSTECTOMY    . COLECTOMY    . COLONOSCOPY WITH PROPOFOL N/A 02/22/2018   Procedure: COLONOSCOPY WITH PROPOFOL;  Surgeon: Toledo, Benay Pike, MD;  Location: [facility] ENDOSCOPY;  Service: Gastroenterology;  Laterality: N/A;  . ESOPHAGOGASTRODUODENOSCOPY (EGD) WITH PROPOFOL N/A 02/22/2018   Procedure: ESOPHAGOGASTRODUODENOSCOPY (EGD) WITH PROPOFOL;  Surgeon: Toledo, Benay Pike, MD;  Location: [facility] ENDOSCOPY;  Service: Gastroenterology;  Laterality: N/A;  . NASAL SINUS SURGERY  2012  . OOPHORECTOMY      Social History:  reports that she has never smoked. She has never used smokeless tobacco. She reports current drug use. She reports that she does not drink alcohol.   Allergies  Allergen Reactions  . Contrast Media [Iodinated Contrast Media] Shortness Of Breath  Per CT report in 2014 pt had break through contrast reaction with hives and SOB following 13 hour prep. MSY   . Lidocaine Hives  . Metrizamide Shortness Of Breath  . Penicillins Hives and Other (See Comments)    ediate rash, facial/tongue/throat swelling, SOB or lightheadedness with hypotension, tachy  . Sacubitril-Valsartan     Sz (unclear if new start entresto vs hypoglycemia)  . Isosorbide Nitrate Other (See Comments)    Headache  . Latex Hives  .  Ondansetron Other (See Comments)    Severe headache  . Tizanidine Other (See Comments)    Feels altered  . Povidone-Iodine Rash    Blistering rash  . Pulmicort [Budesonide] Itching    Family History  Problem Relation Age of Onset  . Cancer Mother 30       ovarian  . CAD Mother   . Cancer Father 29       brain  . CAD Father   . Cancer Daughter 18       skin  . Cancer Maternal Aunt 61       breast  . Leukemia Paternal Grandfather     ***  Prior to Admission medications   Medication Sig Start Date End Date Taking? Authorizing Provider  cyclobenzaprine (FLEXERIL) 5 MG tablet Take 5 mg by mouth 3 (three) times daily as needed for muscle spasms. 07/01/21   [provider]  escitalopram (LEXAPRO) 10 MG tablet Take 10 mg by mouth daily. 12/30/21   [provider]  fentaNYL (DURAGESIC) 75 MCG/HR Place 1 patch onto the skin every 3 (three) days. 01/12/22   [provider]  furosemide (LASIX) 40 MG tablet Take 2 tablets by mouth 2 (two) times daily. 02/17/22   [provider]  gabapentin (NEURONTIN) 100 MG capsule Take 100 mg by mouth at bedtime. 06/01/21   [provider]  lacosamide 150 MG TABS Take 1 tablet (150 mg total) by mouth 2 (two) times daily. 01/17/22   Max Sane, MD  levETIRAcetam (KEPPRA) 500 MG tablet Take 500 mg by mouth 2 (two) times daily. 01/25/22   [provider]  levothyroxine (SYNTHROID) 175 MCG tablet Take 175 mcg by mouth every morning. 12/17/21   [provider]  LORazepam (ATIVAN) 1 MG tablet Take 1 tablet (1 mg total) by mouth 4 (four) times daily as needed. Patient taking differently: Take 1 mg by mouth 4 (four) times daily as needed (stomach cramps or anxiety). 11/10/21   Lorella Nimrod, MD  losartan (COZAAR) 25 MG tablet Take 25 mg by mouth every morning. 01/17/22   [provider]  metoprolol succinate (TOPROL-XL) 50 MG 24 hr tablet Take 50 mg by mouth 2 (two) times daily. 02/16/22   [provider]  morphine (MSIR) 15 MG tablet Take by mouth. 02/17/22   [provider]  naloxone [facility]) nasal spray 4 mg/0.1 mL Place 1 spray into the nose as needed for opioid reversal. May repeat once in opposite nare. 10/05/21   [provider]  nitroGLYCERIN (NITROSTAT) 0.4 MG SL tablet Place 0.4 mg under the tongue every 5 (five) minutes x 3 doses as needed for chest pain. 11/23/21   [provider]  Oxycodone HCl 10 MG TABS Take 10 mg by mouth as directed. mg Take 1 tablet (10 mg total) by mouth every 4 (four) hours as needed for Pain for up to 20 days 12/30/21   [provider]  oxyCODONE-acetaminophen (PERCOCET/ROXICET) 5-325 MG tablet Take 1-2 tablets by mouth  every 4 (four) hours as needed (breakthrough pain). 01/17/20   [provider]  Potassium Chloride ER 20 MEQ TBCR Take 1 tablet by mouth in the morning and at bedtime. 02/16/22   [provider]  predniSONE (STERAPRED UNI-PAK 21 TAB) 10 MG (21) TBPK tablet Take by mouth. 02/28/22   [provider]  promethazine (PHENERGAN) 12.5 MG suppository Place 1 suppository (12.5 mg total) rectally daily as needed for nausea or vomiting. 12/16/21   Fritzi Mandes, MD  promethazine (PHENERGAN) 25 MG tablet Take 25 mg by mouth every 6 (six) hours as needed for nausea/vomiting. 12/13/21   [provider]  rOPINIRole (REQUIP) 4 MG tablet Take 4 mg by mouth at bedtime. 01/30/19   [provider]    Physical Exam: BP 92/62   Pulse 78   Temp 98 F (36.7 C) (Oral)   Resp 16   Ht '5\' 5"'$  (1.651 m)   Wt (!) 158.8 kg   LMP 01/22/2012 (Approximate) Comment: Hysterectomy 5 years ago  SpO2 98%   BMI 58.24 kg/m   General: 56 y.o. year-old female well developed well nourished in no acute distress.  Alert and oriented x3. HEENT: NCAT, EOMI, dry mucous membrane. Neck: Supple, trachea medial Cardiovascular: Regular rate and rhythm with no rubs or gallops.  No thyromegaly or JVD noted.   No lower extremity edema. 2/4 pulses in all 4 extremities. Respiratory: Clear to auscultation with no wheezes or rales. Good inspiratory effort. Abdomen: Soft, nontender nondistended with normal bowel sounds x4 quadrants. Muskuloskeletal: No cyanosis, clubbing or edema noted bilaterally Neuro: CN II-XII intact, strength 5/5 x 4, sensation, reflexes intact Skin: No ulcerative lesions noted or rashes Psychiatry: Judgement and insight appear normal. Mood is appropriate for condition and setting          Labs on Admission:  Basic Metabolic Panel: Recent Labs  Lab 07/30/22 2132  NA 143  K 2.6*  CL 104  CO2 31  GLUCOSE 131*  BUN 8  CREATININE 0.80  CALCIUM 7.8*  MG 1.9   Liver Function Tests: Recent Labs  Lab 07/30/22 2132  AST 35  ALT 38  ALKPHOS 93  BILITOT 0.5  PROT 6.9  ALBUMIN 3.6   No results for input(s): "LIPASE", "AMYLASE" in the last 168 hours. No results for input(s): "AMMONIA" in the last 168 hours. CBC: Recent Labs  Lab 07/30/22 2127  WBC 8.1  NEUTROABS 4.8  HGB 11.7*  HCT 33.9*  MCV 88.5  PLT 188   Cardiac Enzymes: No results for input(s): "CKTOTAL", "CKMB", "CKMBINDEX", "TROPONINI" in the last 168 hours.  BNP (last 3 results) No results for input(s): "BNP" in the last 8760 hours.  ProBNP (last 3 results) No results for input(s): "PROBNP" in the last 8760 hours.  CBG: No results for input(s): "GLUCAP" in the last 168 hours.  Radiological Exams on Admission: CT Head Wo Contrast  Result Date: 07/30/2022 CLINICAL DATA:  seizure EXAM: CT HEAD WITHOUT CONTRAST TECHNIQUE: Contiguous axial images were obtained from the base of the skull through the vertex without intravenous contrast. RADIATION DOSE REDUCTION: This exam was performed according to the departmental dose-optimization program which includes automated exposure control, adjustment of the mA and/or kV according to patient size and/or use of iterative reconstruction technique. COMPARISON:  CT  head 03/01/2022 FINDINGS: Brain: No evidence of large-territorial acute infarction. No parenchymal hemorrhage. No mass lesion. No extra-axial collection. No mass effect or midline shift. No hydrocephalus. Basilar cisterns are patent. Vascular: No hyperdense vessel. Skull:  No acute fracture or focal lesion. Sinuses/Orbits: Left sphenoid sinus mucosal thickening. Otherwise paranasal sinuses and mastoid air cells are clear. The orbits are unremarkable. Other: None. IMPRESSION: No acute intracranial abnormality. Electronically Signed   By: Iven Finn M.D.   On: 07/30/2022 22:00    EKG: I independently viewed the EKG done and my findings are as followed: Normal sinus rhythm at a rate of 86 bpm with prolonged PR interval and QTc 539 ms  Assessment/Plan Present on Admission: **None**  Principal Problem:   Seizures ([place])  Acute on chronic seizures Patient has electrolyte abnormalities including hypocalcemia which could have contributed to the twitching presented by patient Continue Os-Cal EEG in the morning Consider neurology consult based on EEG findings  Hypokalemia K+ is 2.6 K+ will be replenished Please monitor for AM K+ for further replenishmemnt  Hypocalcemia Calcium  Prolonged QT interval Dehydration   DVT prophylaxis: ***   Code Status: ***   Family Communication: ***   Disposition Plan: ***   Consults called: ***   Admission status: ***     Bernadette Hoit MD [facility] Pager [phone]  If 7PM-7AM, please contact night-coverage [email or web address] Password [facility]  07/30/2022, 11:48 PM      Review of Systems: {ROS_Text:26778} Past Medical History:  Diagnosis Date  . Asthma 2013  . Atrial fibrillation ([place])   . C. difficile colitis 01/14/2018  . 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 CHF (congestive heart failure) ([place])   . COPD (chronic obstructive pulmonary disease) ([place])   . Diverticulitis 2010  . DVT (deep venous thrombosis) ([place])    . Endometriosis 1990  . GIB (gastrointestinal bleeding) 01/08/2018  . Heart disease 2013  . Hx MRSA infection   . Iron deficiency   . Iron deficiency anemia 03/27/2018  . Lump or mass in breast   . Restless leg   . Seizures ([place])   . Stomach cancer [facility])    Past Surgical History:  Procedure Laterality Date  . ABDOMINAL HYSTERECTOMY     age 85  . BREAST BIOPSY Right 2014   benign  . CARDIAC DEFIBRILLATOR PLACEMENT    . CARDIAC DEFIBRILLATOR PLACEMENT    . CESAREAN SECTION    . CHOLECYSTECTOMY    . COLECTOMY    . COLONOSCOPY WITH PROPOFOL N/A 02/22/2018   Procedure: COLONOSCOPY WITH PROPOFOL;  Surgeon: Toledo, Benay Pike, MD;  Location: [facility] ENDOSCOPY;  Service: Gastroenterology;  Laterality: N/A;  . ESOPHAGOGASTRODUODENOSCOPY (EGD) WITH PROPOFOL N/A 02/22/2018   Procedure: ESOPHAGOGASTRODUODENOSCOPY (EGD) WITH PROPOFOL;  Surgeon: Toledo, Benay Pike, MD;  Location: [facility] ENDOSCOPY;  Service: Gastroenterology;  Laterality: N/A;  . NASAL SINUS SURGERY  2012  . OOPHORECTOMY     Social History:  reports that she has never smoked. She has never used smokeless tobacco. She reports current drug use. She reports that she does not drink alcohol.  Allergies  Allergen Reactions  . Contrast Media [Iodinated Contrast Media] Shortness Of Breath    Per CT report in 2014 pt had break through contrast reaction with hives and SOB following 13 hour prep. MSY   . Lidocaine Hives  . Metrizamide Shortness Of Breath  . Penicillins Hives and Other (See Comments)    ediate rash, facial/tongue/throat swelling, SOB or lightheadedness with hypotension, tachy  . Sacubitril-Valsartan     Sz (unclear if new start entresto vs hypoglycemia)  . Isosorbide Nitrate Other (See Comments)    Headache  . Latex Hives  . Ondansetron Other (See Comments)  Severe headache  . Tizanidine Other (See Comments)    Feels altered  . Povidone-Iodine Rash    Blistering rash  . Pulmicort [Budesonide] Itching    Family  History  Problem Relation Age of Onset  . Cancer Mother 30       ovarian  . CAD Mother   . Cancer Father 68       brain  . CAD Father   . Cancer Daughter 18       skin  . Cancer Maternal Aunt 36       breast  . Leukemia Paternal Grandfather     Prior to Admission medications   Medication Sig Start Date End Date Taking? Authorizing Provider  cyclobenzaprine (FLEXERIL) 5 MG tablet Take 5 mg by mouth 3 (three) times daily as needed for muscle spasms. 07/01/21   [provider]  escitalopram (LEXAPRO) 10 MG tablet Take 10 mg by mouth daily. 12/30/21   [provider]  fentaNYL (DURAGESIC) 75 MCG/HR Place 1 patch onto the skin every 3 (three) days. 01/12/22   [provider]  furosemide (LASIX) 40 MG tablet Take 2 tablets by mouth 2 (two) times daily. 02/17/22   [provider]  gabapentin (NEURONTIN) 100 MG capsule Take 100 mg by mouth at bedtime. 06/01/21   [provider]  lacosamide 150 MG TABS Take 1 tablet (150 mg total) by mouth 2 (two) times daily. 01/17/22   Max Sane, MD  levETIRAcetam (KEPPRA) 500 MG tablet Take 500 mg by mouth 2 (two) times daily. 01/25/22   [provider]  levothyroxine (SYNTHROID) 175 MCG tablet Take 175 mcg by mouth every morning. 12/17/21   [provider]  LORazepam (ATIVAN) 1 MG tablet Take 1 tablet (1 mg total) by mouth 4 (four) times daily as needed. Patient taking differently: Take 1 mg by mouth 4 (four) times daily as needed (stomach cramps or anxiety). 11/10/21   Lorella Nimrod, MD  losartan (COZAAR) 25 MG tablet Take 25 mg by mouth every morning. 01/17/22   [provider]  metoprolol succinate (TOPROL-XL) 50 MG 24 hr tablet Take 50 mg by mouth 2 (two) times daily. 02/16/22   [provider]  morphine (MSIR) 15 MG tablet Take by mouth. 02/17/22   [provider]  naloxone [facility]) nasal spray 4 mg/0.1 mL Place 1 spray into the nose as needed for opioid reversal. May repeat  once in opposite nare. 10/05/21   [provider]  nitroGLYCERIN (NITROSTAT) 0.4 MG SL tablet Place 0.4 mg under the tongue every 5 (five) minutes x 3 doses as needed for chest pain. 11/23/21   [provider]  Oxycodone HCl 10 MG TABS Take 10 mg by mouth as directed. mg Take 1 tablet (10 mg total) by mouth every 4 (four) hours as needed for Pain for up to 20 days 12/30/21   [provider]  oxyCODONE-acetaminophen (PERCOCET/ROXICET) 5-325 MG tablet Take 1-2 tablets by mouth every 4 (four) hours as needed (breakthrough pain). 01/17/20   [provider]  Potassium Chloride ER 20 MEQ TBCR Take 1 tablet by mouth in the morning and at bedtime. 02/16/22   [provider]  predniSONE (STERAPRED UNI-PAK 21 TAB) 10 MG (21) TBPK tablet Take by mouth. 02/28/22   [provider]  promethazine (PHENERGAN) 12.5 MG suppository Place 1 suppository (12.5 mg total) rectally daily as needed for nausea or vomiting. 12/16/21   Fritzi Mandes, MD  promethazine (PHENERGAN) 25 MG tablet Take 25 mg  by mouth every 6 (six) hours as needed for nausea/vomiting. 12/13/21   [provider]  rOPINIRole (REQUIP) 4 MG tablet Take 4 mg by mouth at bedtime. 01/30/19   [provider]    Physical Exam: Vitals:   07/30/22 2118 07/30/22 2130 07/30/22 2133 07/30/22 2203  BP:  112/81 112/81 90/68  Pulse:  86 88 81  Resp:  (!) '25 16 16  '$ Temp:   98 F (36.7 C)   TempSrc:   Oral   SpO2:  93% 94% 97%  Weight: (!) 158.8 kg     Height: '5\' 5"'$  (1.651 m)      *** Data Reviewed: {Tip this will not be part of the note when signed- Document your independent interpretation of telemetry tracing, EKG, lab, Radiology test or any other diagnostic tests. Add any new diagnostic test ordered today. (Optional):26781} {Results:26384}  Assessment and Plan: No notes have been filed under this hospital service. Service: Hospitalist     Advance Care Planning:   Code Status: Prior  ***  Consults: ***  Family Communication: ***  Severity of Illness: {Observation/Inpatient:21159}  Author: Bernadette Hoit, DO 07/30/2022 11:19 PM  For on call review [email or web address].

## 2022-07-31 ENCOUNTER — Other Ambulatory Visit: Payer: Self-pay

## 2022-07-31 DIAGNOSIS — I10 Essential (primary) hypertension: Secondary | ICD-10-CM | POA: Insufficient documentation

## 2022-07-31 DIAGNOSIS — R197 Diarrhea, unspecified: Secondary | ICD-10-CM | POA: Insufficient documentation

## 2022-07-31 DIAGNOSIS — R569 Unspecified convulsions: Secondary | ICD-10-CM | POA: Diagnosis not present

## 2022-07-31 LAB — COMPREHENSIVE METABOLIC PANEL
ALT: 32 U/L (ref 0–44)
AST: 26 U/L (ref 15–41)
Albumin: 3.1 g/dL — ABNORMAL LOW (ref 3.5–5.0)
Alkaline Phosphatase: 81 U/L (ref 38–126)
Anion gap: 5 (ref 5–15)
BUN: 7 mg/dL (ref 6–20)
CO2: 29 mmol/L (ref 22–32)
Calcium: 6.7 mg/dL — ABNORMAL LOW (ref 8.9–10.3)
Chloride: 108 mmol/L (ref 98–111)
Creatinine, Ser: 0.7 mg/dL (ref 0.44–1.00)
GFR, Estimated: >60 mL/min (ref >60)
Glucose, Bld: 118 mg/dL — ABNORMAL HIGH (ref 70–99)
Potassium: 3.1 mmol/L — ABNORMAL LOW (ref 3.5–5.1)
Sodium: 142 mmol/L (ref 135–145)
Total Bilirubin: 0.5 mg/dL (ref 0.3–1.2)
Total Protein: 6 g/dL — ABNORMAL LOW (ref 6.5–8.1)

## 2022-07-31 LAB — CBC
HCT: 31 % — ABNORMAL LOW (ref 36.0–46.0)
Hemoglobin: 10.7 g/dL — ABNORMAL LOW (ref 12.0–15.0)
MCH: 30.9 pg (ref 26.0–34.0)
MCHC: 34.5 g/dL (ref 30.0–36.0)
MCV: 89.6 fL (ref 80.0–100.0)
Platelets: 162 10*3/uL (ref 150–400)
RBC: 3.46 MIL/uL — ABNORMAL LOW (ref 3.87–5.11)
RDW: 13.8 % (ref 11.5–15.5)
WBC: 7.8 10*3/uL (ref 4.0–10.5)
nRBC: 0 % (ref 0.0–0.2)

## 2022-07-31 LAB — MAGNESIUM: Magnesium: 2.1 mg/dL (ref 1.7–2.4)

## 2022-07-31 LAB — HIV ANTIBODY (ROUTINE TESTING W REFLEX): HIV Screen 4th Generation wRfx: NONREACTIVE

## 2022-07-31 LAB — PHOSPHORUS: Phosphorus: 4.7 mg/dL — ABNORMAL HIGH (ref 2.5–4.6)

## 2022-07-31 MED ORDER — POTASSIUM CHLORIDE 20 MEQ PO PACK
40.0000 meq | PACK | Freq: Once | ORAL | Status: AC
  Administered 2022-07-31: 40 meq via ORAL
  Filled 2022-07-31: qty 2

## 2022-07-31 MED ORDER — ACETAMINOPHEN 325 MG PO TABS
650.0000 mg | ORAL_TABLET | Freq: Four times a day (QID) | ORAL | Status: DC | PRN
  Administered 2022-07-31 – 2022-08-01 (×3): 650 mg via ORAL
  Filled 2022-07-31 (×3): qty 2

## 2022-07-31 MED ORDER — LEVOTHYROXINE SODIUM 50 MCG PO TABS
175.0000 ug | ORAL_TABLET | Freq: Every morning | ORAL | Status: DC
  Administered 2022-07-31 – 2022-08-01 (×2): 175 ug via ORAL
  Filled 2022-07-31: qty 1
  Filled 2022-07-31: qty 4

## 2022-07-31 MED ORDER — OXYCODONE-ACETAMINOPHEN 5-325 MG PO TABS
1.0000 | ORAL_TABLET | Freq: Three times a day (TID) | ORAL | Status: DC | PRN
  Administered 2022-07-31 – 2022-08-01 (×3): 1 via ORAL
  Filled 2022-07-31 (×3): qty 1

## 2022-07-31 MED ORDER — FAMOTIDINE 20 MG PO TABS
20.0000 mg | ORAL_TABLET | Freq: Two times a day (BID) | ORAL | Status: DC
  Administered 2022-07-31 – 2022-08-01 (×2): 20 mg via ORAL
  Filled 2022-07-31 (×2): qty 1

## 2022-07-31 MED ORDER — ACETAMINOPHEN 650 MG RE SUPP: 650.0000 mg | Freq: Four times a day (QID) | RECTAL | Status: DC | PRN

## 2022-07-31 MED ORDER — CALCIUM CARBONATE 1250 (500 CA) MG PO TABS
1.0000 | ORAL_TABLET | Freq: Every day | ORAL | Status: DC
  Administered 2022-07-31 – 2022-08-01 (×2): 1250 mg via ORAL
  Filled 2022-07-31 (×2): qty 1

## 2022-07-31 MED ORDER — INFLUENZA VAC SPLIT QUAD 0.5 ML IM SUSY
0.5000 mL | PREFILLED_SYRINGE | INTRAMUSCULAR | Status: AC
  Administered 2022-08-01: 0.5 mL via INTRAMUSCULAR
  Filled 2022-07-31: qty 0.5

## 2022-07-31 MED ORDER — PNEUMOCOCCAL VAC POLYVALENT 25 MCG/0.5ML IJ INJ
0.5000 mL | INJECTION | INTRAMUSCULAR | Status: AC
  Administered 2022-08-01: 0.5 mL via INTRAMUSCULAR
  Filled 2022-07-31: qty 0.5

## 2022-07-31 MED ORDER — PROCHLORPERAZINE EDISYLATE 10 MG/2ML IJ SOLN: 10.0000 mg | Freq: Four times a day (QID) | INTRAMUSCULAR | Status: DC | PRN

## 2022-07-31 MED ORDER — ENOXAPARIN SODIUM 80 MG/0.8ML IJ SOSY
0.5000 mg/kg | PREFILLED_SYRINGE | INTRAMUSCULAR | Status: DC
  Administered 2022-08-01: 80 mg via SUBCUTANEOUS
  Filled 2022-07-31 (×2): qty 0.8

## 2022-07-31 MED ORDER — LEVETIRACETAM 500 MG PO TABS
500.0000 mg | ORAL_TABLET | Freq: Two times a day (BID) | ORAL | Status: DC
  Administered 2022-07-31 – 2022-08-01 (×3): 500 mg via ORAL
  Filled 2022-07-31 (×3): qty 1

## 2022-07-31 MED ORDER — GABAPENTIN 100 MG PO CAPS
100.0000 mg | ORAL_CAPSULE | Freq: Every day | ORAL | Status: DC
  Administered 2022-07-31 (×2): 100 mg via ORAL
  Filled 2022-07-31 (×2): qty 1

## 2022-07-31 MED ORDER — LACOSAMIDE 50 MG PO TABS
150.0000 mg | ORAL_TABLET | Freq: Two times a day (BID) | ORAL | Status: DC
  Administered 2022-07-31 – 2022-08-01 (×3): 150 mg via ORAL
  Filled 2022-07-31 (×3): qty 3

## 2022-07-31 MED ORDER — MAGNESIUM SULFATE IN D5W 1-5 GM/100ML-% IV SOLN
1.0000 g | Freq: Once | INTRAVENOUS | Status: AC
  Administered 2022-07-31: 1 g via INTRAVENOUS
  Filled 2022-07-31: qty 100

## 2022-07-31 NOTE — ED Notes (Signed)
Advised nurse that patient has ready bed 

## 2022-07-31 NOTE — Progress Notes (Signed)
Anticoagulation monitoring(Lovenox):  56 yo female ordered Lovenox 40 mg Q24h    Filed Weights   07/30/22 2118  Weight: (!) 158.8 kg (350 lb)   BMI 58.24    Lab Results  Component Value Date   CREATININE 0.80 07/30/2022   CREATININE 1.11 (H) 05/16/2022   CREATININE 0.99 03/01/2022   Estimated Creatinine Clearance: 124 mL/min (by C-G formula based on SCr of 0.8 mg/dL). Hemoglobin & Hematocrit     Component Value Date/Time   HGB 11.7 (L) 07/30/2022 2127   HGB 11.7 (L) 10/03/2014 1838   HCT 33.9 (L) 07/30/2022 2127   HCT 34.9 (L) 10/03/2014 1838     Per Protocol for Patient with estCrcl > 30 ml/min and BMI > 30, will transition to Lovenox 80 mg Q24h.

## 2022-07-31 NOTE — ED Notes (Signed)
Advised urse that patient has ready bed

## 2022-07-31 NOTE — Progress Notes (Signed)
PROGRESS NOTE    Brittany Nguyen  YDX:[id number] DOB: 1965/09/27 DOA: 07/30/2022  PCP: [name], MD   Brief Narrative:  This 56 years old female with PMH significant for Chronic combined systolic and diastolic dysfunction, gastric neuroendocrine tumor, nonischemic cardiomyopathy s/p AICD placement, GERD, hypothyroidism, OSA, seizures and obesity presented in the ED due to seizures.  Patient states she was having diarrhea for last several days and while at the dinner table last evening she presented with seizures.  EMS was activated on arrival,  She was noted to have focal seizures and facial twitching so she was given 5 mg of Versed IM and on arrival in the ED she had another episode of twitching ,  has received another dose of IV Versed and She was also found to be hypoxic with SPO2 of 88% on room air.  She was placed on NRB with O2 saturation improved to 100%.  Patient was admitted for further evaluation.  CT head without any acute abnormality.  Assessment & Plan:   Principal Problem:   Seizures ([place]) Active Problems:   Hypokalemia   Acquired hypothyroidism   Prolonged QT interval   Hypocalcemia   Diarrhea   Essential hypertension   Morbid obesity with body mass index (BMI) of 50.0 to 59.9 in adult [facility])  Seizure disorder: Patient has electrolyte abnormalities including hypocalcemia which could have contributed to twitching. Continue Os-Cal. Continue lacosamide, gabapentin and Keppra. Obtain EEG Continue seizure precautions.  Hypokalemia: Replaced.  Continue to monitor   Hypocalcemia: Corrected calcium level at 8.1 Continue Os-Cal   Long QT interval: QTc 539 ms Avoid QT prolonging drugs. Electrolyte replaced.  Continue to monitor Repeat EKG    Combined systolic and diastolic CHF: Patient appears euvolemic, not in any acute exacerbation. Continue total input/output, daily weights and fluid restriction Continue heart healthy diet      Recent echocardiogram  shows LVEF 25 to 30%. Home meds temporarily held due to soft BP   Malignant carcinoid tumor of the stomach: Patient follows with [facility] gastro[facility]ology team and [facility] hematology oncology Johnnye Sima, [place])   Hypothyroidism: Continue Synthroid   Essential hypertension: BP meds on hold due to soft BP.   Morbid obesity (BMI 58.24) Diet and lifestyle modification. Estimated body mass index is 58.24 kg/m as calculated from the following:   Height as of this encounter: '5\' 5"'$  (1.651 m).   Weight as of this encounter: 158.8 kg.    DVT prophylaxis: Lovenox Code Status: Full code Family Communication:No family at bed side. Disposition Plan:  Status is: Observation The patient remains OBS appropriate and will d/c before 2 midnights.   Admitted for seizure disorder found to be secondary to electrolyte abnormalities.  Anticipated discharge 08/01/2022  Consultants:  None  Procedures: CT head Antimicrobials: None.  Subjective: Patient was seen and examined at bedside.  Overnight events noted.   Patient reports doing better. She complains of having headache and generalized weakness.  Objective: Vitals:   07/31/22 0900 07/31/22 1200 07/31/22 1232 07/31/22 1301  BP: 122/69  115/82 110/62  Pulse: 71   75  Resp: 20  14 (!) 22  Temp:  97.9 F (36.6 C)  98 F (36.7 C)  TempSrc:  Oral  Oral  SpO2: 95%   98%  Weight:      Height:       No intake or output data in the 24 hours ending 07/31/22 1435 Filed Weights   07/30/22 2118  Weight: (!) 158.8 kg    Examination:  General exam: Appears comfortable, not in any acute distress.  Deconditioned Respiratory system: Clear bilaterally, respiratory effort normal, RR 15 Cardiovascular system: S1 & S2 heard, regular rate and rhythm, no murmur.  AICD noted. Gastrointestinal system: Abdomen is soft, non tender, non distended, BS+ Central nervous system: Alert and oriented x 3 . No focal neurological deficits. Extremities: No  edema, no cyanosis, no clubbing Skin: No rashes, lesions or ulcers Psychiatry: Judgement and insight appear normal. Mood & affect appropriate.     Data Reviewed: I have personally reviewed following labs and imaging studies  CBC: Recent Labs  Lab 07/30/22 2127 07/31/22 0302  WBC 8.1 7.8  NEUTROABS 4.8  --   HGB 11.7* 10.7*  HCT 33.9* 31.0*  MCV 88.5 89.6  PLT 188 800   Basic Metabolic Panel: Recent Labs  Lab 07/30/22 2132 07/31/22 0302  NA 143 142  K 2.6* 3.1*  CL 104 108  CO2 31 29  GLUCOSE 131* 118*  BUN 8 7  CREATININE 0.80 0.70  CALCIUM 7.8* 6.7*  MG 1.9 2.1  PHOS  --  4.7*   GFR: Estimated Creatinine Clearance: 124 mL/min (by C-G formula based on SCr of 0.7 mg/dL). Liver Function Tests: Recent Labs  Lab 07/30/22 2132 07/31/22 0302  AST 35 26  ALT 38 32  ALKPHOS 93 81  BILITOT 0.5 0.5  PROT 6.9 6.0*  ALBUMIN 3.6 3.1*   No results for input(s): "LIPASE", "AMYLASE" in the last 168 hours. No results for input(s): "AMMONIA" in the last 168 hours. Coagulation Profile: No results for input(s): "INR", "PROTIME" in the last 168 hours. Cardiac Enzymes: No results for input(s): "CKTOTAL", "CKMB", "CKMBINDEX", "TROPONINI" in the last 168 hours. BNP (last 3 results) No results for input(s): "PROBNP" in the last 8760 hours. HbA1C: No results for input(s): "HGBA1C" in the last 72 hours. CBG: No results for input(s): "GLUCAP" in the last 168 hours. Lipid Profile: No results for input(s): "CHOL", "HDL", "LDLCALC", "TRIG", "CHOLHDL", "LDLDIRECT" in the last 72 hours. Thyroid Function Tests: No results for input(s): "TSH", "T4TOTAL", "FREET4", "T3FREE", "THYROIDAB" in the last 72 hours. Anemia Panel: No results for input(s): "VITAMINB12", "FOLATE", "FERRITIN", "TIBC", "IRON", "RETICCTPCT" in the last 72 hours. Sepsis Labs: No results for input(s): "PROCALCITON", "LATICACIDVEN" in the last 168 hours.  No results found for this or any previous visit (from the  past 240 hour(s)).   Radiology Studies: CT Head Wo Contrast  Result Date: 07/30/2022 CLINICAL DATA:  seizure EXAM: CT HEAD WITHOUT CONTRAST TECHNIQUE: Contiguous axial images were obtained from the base of the skull through the vertex without intravenous contrast. RADIATION DOSE REDUCTION: This exam was performed according to the departmental dose-optimization program which includes automated exposure control, adjustment of the mA and/or kV according to patient size and/or use of iterative reconstruction technique. COMPARISON:  CT head 03/01/2022 FINDINGS: Brain: No evidence of large-territorial acute infarction. No parenchymal hemorrhage. No mass lesion. No extra-axial collection. No mass effect or midline shift. No hydrocephalus. Basilar cisterns are patent. Vascular: No hyperdense vessel. Skull: No acute fracture or focal lesion. Sinuses/Orbits: Left sphenoid sinus mucosal thickening. Otherwise paranasal sinuses and mastoid air cells are clear. The orbits are unremarkable. Other: None. IMPRESSION: No acute intracranial abnormality. Electronically Signed   By: Iven Finn M.D.   On: 07/30/2022 22:00     Scheduled Meds:  calcium carbonate  1 tablet Oral Q breakfast   enoxaparin (LOVENOX) injection  0.5 mg/kg Subcutaneous Q24H   gabapentin  100 mg Oral QHS  lacosamide  150 mg Oral BID   levETIRAcetam  500 mg Oral BID   levothyroxine  175 mcg Oral q morning   potassium chloride  40 mEq Oral Once   Continuous Infusions:   LOS: 0 days    Time spent: 50 mins    Jhamir Pickup, MD [facility]   If 7PM-7AM, please contact night-coverage

## 2022-08-01 DIAGNOSIS — R569 Unspecified convulsions: Secondary | ICD-10-CM | POA: Diagnosis not present

## 2022-08-01 LAB — CBC
HCT: 33.6 % — ABNORMAL LOW (ref 36.0–46.0)
Hemoglobin: 11.5 g/dL — ABNORMAL LOW (ref 12.0–15.0)
MCH: 30.5 pg (ref 26.0–34.0)
MCHC: 34.2 g/dL (ref 30.0–36.0)
MCV: 89.1 fL (ref 80.0–100.0)
Platelets: 180 10*3/uL (ref 150–400)
RBC: 3.77 MIL/uL — ABNORMAL LOW (ref 3.87–5.11)
RDW: 13.6 % (ref 11.5–15.5)
WBC: 8.1 10*3/uL (ref 4.0–10.5)
nRBC: 0 % (ref 0.0–0.2)

## 2022-08-01 LAB — BASIC METABOLIC PANEL
Anion gap: 8 (ref 5–15)
BUN: 12 mg/dL (ref 6–20)
CO2: 27 mmol/L (ref 22–32)
Calcium: 7.7 mg/dL — ABNORMAL LOW (ref 8.9–10.3)
Chloride: 107 mmol/L (ref 98–111)
Creatinine, Ser: 0.74 mg/dL (ref 0.44–1.00)
GFR, Estimated: >60 mL/min (ref >60)
Glucose, Bld: 130 mg/dL — ABNORMAL HIGH (ref 70–99)
Potassium: 3.7 mmol/L (ref 3.5–5.1)
Sodium: 142 mmol/L (ref 135–145)

## 2022-08-01 LAB — PHOSPHORUS: Phosphorus: 3.1 mg/dL (ref 2.5–4.6)

## 2022-08-01 LAB — MAGNESIUM: Magnesium: 2.3 mg/dL (ref 1.7–2.4)

## 2022-08-01 MED ORDER — CALCIUM CARBONATE 1250 (500 CA) MG PO TABS: 1.0000 | ORAL_TABLET | Freq: Every day | ORAL | 1 refills | Status: DC

## 2022-08-01 NOTE — Discharge Summary (Signed)
Physician Discharge Summary  Brittany Nguyen RDE:[id number] DOB: 04-02-1966 DOA: 07/30/2022  PCP: [name], MD  Admit date: 07/30/2022  Discharge date: 08/01/2022  Admitted From: Home. Disposition:  Home.  Recommendations for Outpatient Follow-up:  Follow up with PCP in 1-2 weeks. Please obtain BMP/CBC in one week. Advised to continue seizure medications lacosamide and Keppra twice daily. Advised to follow-up with Neurology as scheduled. Advised to take calcium supplementation as prescribed.  Home Health: None Equipment/Devices:None  Discharge Condition: Stable CODE STATUS:Full code Diet recommendation: Heart Healthy   Brief [facility] Course: This 56 years old female with PMH significant for Chronic combined systolic and diastolic dysfunction, gastric neuroendocrine tumor, nonischemic cardiomyopathy s/p AICD placement, GERD, hypothyroidism, OSA, seizures and obesity presented in the ED due to seizures.  Patient states she was having diarrhea for last several days and while at the dinner table last evening she presented with seizures.  EMS was activated on arrival,  She was noted to have focal seizures and facial twitching so she was given 5 mg of Versed IM and on arrival in the ED she had another episode of twitching ,  has received another dose of IV Versed and She was also found to be hypoxic with SPO2 of 88% on room air.  She was placed on NRB with O2 saturation improved to 100%.  Patient was admitted for further evaluation.  CT head without any acute abnormality.  Case was discussed with neurology recommended since the seizures were provoked secondary to electrolyte abnormalities.  Advised to continue seizure medications.  EEG no evidence of seizures.  Patient was started on calcium and potassium supplementation.  Electrolytes improved.  Patient feels better and wants to be discharged.  Patient is being discharged home  Discharge Diagnoses:  Principal Problem:    Seizures ([place]) Active Problems:   Hypokalemia   Acquired hypothyroidism   Prolonged QT interval   Hypocalcemia   Diarrhea   Essential hypertension   Morbid obesity with body mass index (BMI) of 50.0 to 59.9 in adult [facility])  Seizure disorder: Patient has electrolyte abnormalities including hypocalcemia which could have contributed to twitching. Continue Os-Cal. Continue lacosamide, gabapentin and Keppra. EEG: No evidence of seizures. Continue seizure precautions.   Hypokalemia: Replaced.  Resolved.   Hypocalcemia: Corrected calcium level at 8.1 Continue Os-Cal   Long QT interval: QTc 539 ms Avoid QT prolonging drugs. Electrolyte replaced. Repeat EKG : normal QTC   Combined systolic and diastolic CHF: Patient appears euvolemic, not in any acute exacerbation. Continue total input/output, daily weights and fluid restriction Continue heart healthy diet      Recent echocardiogram shows LVEF 25 to 30%. Home meds temporarily held due to soft BP   Malignant carcinoid tumor of the stomach: Patient follows with [facility] gastroenterology team and [facility] hematology oncology Brittany Nguyen, Brittany Nguyen)   Hypothyroidism: Continue Synthroid   Essential hypertension: BP meds on hold due to soft BP.   Morbid obesity (BMI 58.24) Diet and lifestyle modification. Estimated body mass index is 58.24 kg/m as calculated from the following:   Height as of this encounter: _0  (1.651 m).   Weight as of this encounter: 158.8 kg.   Discharge Instructions  Discharge Instructions     Call MD for:  difficulty breathing, headache or visual disturbances   Complete by: As directed    Call MD for:  persistant dizziness or light-headedness   Complete by: As directed    Call MD for:  persistant nausea and vomiting   Complete  by: As directed    Diet - low sodium heart healthy   Complete by: As directed    Diet Carb Modified   Complete by: As directed    Discharge instructions   Complete by:  As directed    Advised to follow-up with primary care physician in 1 week. Advised to continue seizure medication lacosamide and Keppra twice daily. Advised to follow-up with neurology as scheduled. Advised to take calcium supplementation as prescribed.   Increase activity slowly   Complete by: As directed       Allergies as of 08/01/2022       Reactions   Contrast Media [iodinated Contrast Media] Shortness Of Breath   Per CT report in 2014 pt had break through contrast reaction with hives and SOB following 13 hour prep. MSY    Ferumoxytol Nausea Only   Iron Sucrose Anaphylaxis   Lidocaine Hives   Metrizamide Shortness Of Breath   Penicillins Hives, Other (See Comments)   ediate rash, facial/tongue/throat swelling, SOB or lightheadedness with hypotension, tachy   Sacubitril-valsartan    Sz (unclear if new start entresto vs hypoglycemia)   Isosorbide Nitrate Other (See Comments)   Headache   Latex Hives   Ondansetron Other (See Comments)   Severe headache   Tizanidine Other (See Comments)   Feels altered   Povidone-iodine Rash   Blistering rash   Pulmicort [budesonide] Itching        Medication List     STOP taking these medications    Oxycodone HCl 10 MG Tabs   predniSONE 10 MG (21) Tbpk tablet Commonly known as: STERAPRED UNI-PAK 21 TAB       TAKE these medications    calcium carbonate 1250 (500 Ca) MG tablet Commonly known as: OS-CAL - dosed in mg of elemental calcium Take 1 tablet (1,250 mg total) by mouth daily with breakfast. Start taking on: August 02, 2022   cyclobenzaprine 5 MG tablet Commonly known as: FLEXERIL Take 5 mg by mouth 3 (three) times daily as needed for muscle spasms.   escitalopram 10 MG tablet Commonly known as: LEXAPRO Take 10 mg by mouth daily.   fentaNYL 75 MCG/HR Commonly known as: [place] 1 patch onto the skin every 3 (three) days.   furosemide 40 MG tablet Commonly known as: LASIX Take 2 tablets by mouth 2  (two) times daily.   gabapentin 100 MG capsule Commonly known as: NEURONTIN Take 100 mg by mouth at bedtime.   Lacosamide 150 MG Tabs Take 1 tablet (150 mg total) by mouth 2 (two) times daily. What changed: Another medication with the same name was removed. Continue taking this medication, and follow the directions you see here.   levETIRAcetam 500 MG tablet Commonly known as: KEPPRA Take 500 mg by mouth 2 (two) times daily.   levothyroxine 175 MCG tablet Commonly known as: SYNTHROID Take 175 mcg by mouth every morning.   LORazepam 1 MG tablet Commonly known as: ATIVAN Take 1 tablet (1 mg total) by mouth 4 (four) times daily as needed. What changed: reasons to take this   losartan 25 MG tablet Commonly known as: COZAAR Take 12.5 mg by mouth 2 (two) times daily.   metoprolol succinate 50 MG 24 hr tablet Commonly known as: TOPROL-XL Take 50 mg by mouth 2 (two) times daily.   morphine 15 MG tablet Commonly known as: MSIR Take 15 mg by mouth 3 (three) times daily.   naloxone 4 MG/0.1ML Liqd nasal spray kit Commonly known as:  NARCAN Place 1 spray into the nose as needed for opioid reversal. May repeat once in opposite nare.   nitroGLYCERIN 0.4 MG SL tablet Commonly known as: NITROSTAT Place 0.4 mg under the tongue every 5 (five) minutes x 3 doses as needed for chest pain.   oxyCODONE-acetaminophen 5-325 MG tablet Commonly known as: PERCOCET/ROXICET Take 1-2 tablets by mouth every 4 (four) hours as needed (breakthrough pain).   Potassium Chloride ER 20 MEQ Tbcr Take 1 tablet by mouth in the morning and at bedtime.   promethazine 25 MG tablet Commonly known as: PHENERGAN Take 25 mg by mouth every 6 (six) hours as needed for nausea/vomiting.   promethazine 12.5 MG suppository Commonly known as: PHENERGAN Place 1 suppository (12.5 mg total) rectally daily as needed for nausea or vomiting.   rOPINIRole 4 MG tablet Commonly known as: REQUIP Take 4 mg by mouth at  bedtime.        Follow-up Information     [name], MD. Go in 1 week(s).   Specialty: Family Medicine Why: office will call patient for follow-up appt. Contact information: [address] [place] [address] [phone]                Allergies  Allergen Reactions   Contrast Media [Iodinated Contrast Media] Shortness Of Breath    Per CT report in 2014 pt had break through contrast reaction with hives and SOB following 13 hour prep. MSY    Ferumoxytol Nausea Only   Iron Sucrose Anaphylaxis   Lidocaine Hives   Metrizamide Shortness Of Breath   Penicillins Hives and Other (See Comments)    ediate rash, facial/tongue/throat swelling, SOB or lightheadedness with hypotension, tachy   Sacubitril-Valsartan     Sz (unclear if new start entresto vs hypoglycemia)   Isosorbide Nitrate Other (See Comments)    Headache   Latex Hives   Ondansetron Other (See Comments)    Severe headache   Tizanidine Other (See Comments)    Feels altered   Povidone-Iodine Rash    Blistering rash   Pulmicort [Budesonide] Itching    Consultations: None   Procedures/Studies: EEG adult  Result Date: 2022-08-28 Lora Havens, MD     08-28-22  1:10 PM Patient Name: Brittany Nguyen MRN: [id number] Epilepsy Attending: Lora Havens Referring Physician/Provider: Bernadette Hoit, DO Date: 2022-08-28 Duration: 29.28 mins Patient history: 56 year old female with seizure.  EEG to evaluate for seizure. Level of alertness: Awake, asleep AEDs during EEG study: LEV, LCM, GBP Technical aspects: This EEG study was done with scalp electrodes positioned according to the 10-20 International system of electrode placement. Electrical activity was reviewed with band pass filter of 1-_0 , sensitivity of 7 uV/mm, display speed of 18m/sec with a _1  notched filter applied as appropriate. EEG data were recorded continuously and digitally stored.  Video monitoring was available  and reviewed as appropriate. Description: The posterior dominant rhythm consists of 8 Hz activity of moderate voltage (25-35 uV) seen predominantly in posterior head regions, symmetric and reactive to eye opening and eye closing. Sleep was characterized by vertex waves, sleep spindles (12 to 14 Hz), maximal frontocentral region. Physiologic photic driving was not seen during photic stimulation. Hyperventilation was not performed.   IMPRESSION: This study is within normal limits. No seizures or epileptiform discharges were seen throughout the recording. A normal interictal EEG does not exclude nor support the diagnosis of epilepsy. PLora Havens  CT Head Wo Contrast  Result Date: 07/30/2022 CLINICAL  DATA:  seizure EXAM: CT HEAD WITHOUT CONTRAST TECHNIQUE: Contiguous axial images were obtained from the base of the skull through the vertex without intravenous contrast. RADIATION DOSE REDUCTION: This exam was performed according to the departmental dose-optimization program which includes automated exposure control, adjustment of the mA and/or kV according to patient size and/or use of iterative reconstruction technique. COMPARISON:  CT head 03/01/2022 FINDINGS: Brain: No evidence of large-territorial acute infarction. No parenchymal hemorrhage. No mass lesion. No extra-axial collection. No mass effect or midline shift. No hydrocephalus. Basilar cisterns are patent. Vascular: No hyperdense vessel. Skull: No acute fracture or focal lesion. Sinuses/Orbits: Left sphenoid sinus mucosal thickening. Otherwise paranasal sinuses and mastoid air cells are clear. The orbits are unremarkable. Other: None. IMPRESSION: No acute intracranial abnormality. Electronically Signed   By: Iven Finn M.D.   On: 07/30/2022 22:00    Subjective: Patient was seen and examined at bedside.  Overnight events noted.   Patient reports doing much better,  denies any further seizures. Electrolytes have improved.  EEG: No evidence of  seizures.  Patient is being discharged home.  Discharge Exam: Vitals:   08/01/22 0731 08/01/22 1300  BP: 124/76   Pulse: 80   Resp: 17 18  Temp: 98.1 F (36.7 C) 98 F (36.7 C)  SpO2: 97% 98%   Vitals:   07/31/22 2154 08/01/22 0355 08/01/22 0731 08/01/22 1300  BP: 119/73 113/83 124/76   Pulse: 67 72 80   Resp: _0 Temp: 98 F (36.7 C) 98.1 F (36.7 C) 98.1 F (36.7 C) 98 F (36.7 C)  TempSrc:   Oral Oral  SpO2: 96% 97% 97% 98%  Weight:      Height:        General: Pt is alert, awake, not in acute distress Cardiovascular: RRR, S1/S2 +, no rubs, no gallops Respiratory: CTA bilaterally, no wheezing, no rhonchi Abdominal: Soft, NT, ND, bowel sounds + Extremities: no edema, no cyanosis    The results of significant diagnostics from this hospitalization (including imaging, microbiology, ancillary and laboratory) are listed below for reference.     Microbiology: No results found for this or any previous visit (from the past 240 hour(s)).   Labs: BNP (last 3 results) No results for input(s): "BNP" in the last 8760 hours. Basic Metabolic Panel: Recent Labs  Lab 07/30/22 2132 07/31/22 0302 08/01/22 0459  NA 143 142 142  K 2.6* 3.1* 3.7  CL 104 108 107  CO2 _1 GLUCOSE 131* 118* 130*  BUN _2 CREATININE 0.80 0.70 0.74  CALCIUM 7.8* 6.7* 7.7*  MG 1.9 2.1 2.3  PHOS  --  4.7* 3.1   Liver Function Tests: Recent Labs  Lab 07/30/22 2132 07/31/22 0302  AST 35 26  ALT 38 32  ALKPHOS 93 81  BILITOT 0.5 0.5  PROT 6.9 6.0*  ALBUMIN 3.6 3.1*   No results for input(s): "LIPASE", "AMYLASE" in the last 168 hours. No results for input(s): "AMMONIA" in the last 168 hours. CBC: Recent Labs  Lab 07/30/22 2127 07/31/22 0302 08/01/22 0459  WBC 8.1 7.8 8.1  NEUTROABS 4.8  --   --   HGB 11.7* 10.7* 11.5*  HCT 33.9* 31.0* 33.6*  MCV 88.5 89.6 89.1  PLT 188 162 180   Cardiac Enzymes: No results for input(s): "CKTOTAL", "CKMB", "CKMBINDEX",  "TROPONINI" in the last 168 hours. BNP: Invalid input(s): "POCBNP" CBG: No results for input(s): "GLUCAP" in the last 168 hours. D-Dimer No results  for input(s): "DDIMER" in the last 72 hours. Hgb A1c No results for input(s): "HGBA1C" in the last 72 hours. Lipid Profile No results for input(s): "CHOL", "HDL", "LDLCALC", "TRIG", "CHOLHDL", "LDLDIRECT" in the last 72 hours. Thyroid function studies No results for input(s): "TSH", "T4TOTAL", "T3FREE", "THYROIDAB" in the last 72 hours.  Invalid input(s): "FREET3" Anemia work up No results for input(s): "VITAMINB12", "FOLATE", "FERRITIN", "TIBC", "IRON", "RETICCTPCT" in the last 72 hours. Urinalysis    Component Value Date/Time   COLORURINE YELLOW (A) 03/02/2022 0153   APPEARANCEUR CLEAR (A) 03/02/2022 0153   APPEARANCEUR Clear 09/02/2014 2312   LABSPEC 1.012 03/02/2022 0153   LABSPEC 1.013 09/02/2014 2312   PHURINE 5.0 03/02/2022 0153   GLUCOSEU NEGATIVE 03/02/2022 0153   GLUCOSEU Negative 09/02/2014 2312   HGBUR NEGATIVE 03/02/2022 0153   BILIRUBINUR NEGATIVE 03/02/2022 0153   BILIRUBINUR Negative 09/02/2014 2312   KETONESUR NEGATIVE 03/02/2022 0153   PROTEINUR NEGATIVE 03/02/2022 0153   NITRITE NEGATIVE 03/02/2022 0153   LEUKOCYTESUR NEGATIVE 03/02/2022 0153   LEUKOCYTESUR Negative 09/02/2014 2312   Sepsis Labs Recent Labs  Lab 07/30/22 2127 07/31/22 0302 08/01/22 0459  WBC 8.1 7.8 8.1   Microbiology No results found for this or any previous visit (from the past 240 hour(s)).   Time coordinating discharge: Over 30 minutes  SIGNED:   Shawna Clamp, MD  [facility] 08/01/2022, 1:31 PM Pager   If 7PM-7AM, please contact night-coverage

## 2022-08-01 NOTE — Progress Notes (Signed)
Patient discharged home. Peripheral IV removed. Discharge instructions provided as well as additional resources. All questions answered.

## 2022-08-01 NOTE — Discharge Instructions (Addendum)
Advised to follow-up with primary care physician in 1 week. Advised to continue seizure medications lacosamide and Keppra twice daily. Advised to follow-up with Neurology as scheduled. Advised to take calcium supplementation as prescribed.  Utility Resources Agency Name: [facility] Agency Address: [address] [place], [place] [address] Phone: [phone] Website: [email or web address] Service(s) Offered: Housing services, self-sufficiency, congregate meal  program, and individual development account program.  Agency Name: [employer] [place] Address: [address], [place], [place] [address] Phone: [phone] Email: [email or web address] Website: [email or web address] Service(s) Offered: Housing the homeless, feeding the hungry, Scientific laboratory technician, job and education related services.  Agency Name: [facility] Address: [address] [place], [place] [address] Phone: [phone] Email: [email or web address] Service(s) Offered: Counseling, problem pregnancy, advocacy for Hispanics,  limited emergency financial assistance.  Agency Name: Department [employer] Address: [address] [place], [place] [address] Phone: [phone] Website: [email or web address] Service(s) Offered: Child support services; child welfare services; SNAPS;  Medicaid; work first family assistance; and aid with fuel,  rent, food and medicine.  Agency Name: [employer] Address: [place] [place],  [address] Phone: [phone] or [phone] Email: [email or web address] Service(s) Offered: Family services and transient assistance; emergency food,  fuel, clothing, limited furniture, utilities; budget counseling,  general counseling; give a kid a coat; thrift store; Christmas  food and toys. Utility assistance, food pantry, rental  assistance, life sustaining medicine

## 2022-08-01 NOTE — Progress Notes (Signed)
Eeg done 

## 2022-08-01 NOTE — TOC Transition Note (Addendum)
Transition of Care [facility]) - CM/SW Discharge Note   Patient Details  Name: Brittany Nguyen MRN: [id number] Date of Birth: April 29, 1966  Transition of [facility]) CM/SW Contact:  Laurena Slimmer, RN Phone Number: 08/01/2022, 11:46 AM   Clinical Narrative:    Per primary nurse patient does not have heat in her home.  Utility resources added to AVS.   Spoke with patient regarding resources provided. She stated she had contacted all of the resources provided unsuccessfully. Patient advised all resources for utilities have been provided and no other resources are available at this time.  TOC sigining off.          Patient Goals and CMS Choice        Discharge Placement                       Discharge Plan and Services                                     Social Determinants of Health (SDOH) Interventions     Readmission Risk Interventions     No data to display

## 2022-08-01 NOTE — Procedures (Signed)
Patient Name: Brittany Nguyen  MRN: [id number]  Epilepsy Attending: Lora Havens  Referring Physician/Provider: Bernadette Hoit, DO  Date: 08/01/2022  Duration: 29.28 mins  Patient history: 56 year old female with seizure.  EEG to evaluate for seizure.  Level of alertness: Awake, asleep  AEDs during EEG study: LEV, LCM, GBP  Technical aspects: This EEG study was done with scalp electrodes positioned according to the 10-20 International system of electrode placement. Electrical activity was reviewed with band pass filter of 1-'70Hz'$ , sensitivity of 7 uV/mm, display speed of 31m/sec with a '60Hz'$  notched filter applied as appropriate. EEG data were recorded continuously and digitally stored.  Video monitoring was available and reviewed as appropriate.  Description: The posterior dominant rhythm consists of 8 Hz activity of moderate voltage (25-35 uV) seen predominantly in posterior head regions, symmetric and reactive to eye opening and eye closing. Sleep was characterized by vertex waves, sleep spindles (12 to 14 Hz), maximal frontocentral region. Physiologic photic driving was not seen during photic stimulation. Hyperventilation was not performed.     IMPRESSION: This study is within normal limits. No seizures or epileptiform discharges were seen throughout the recording.  A normal interictal EEG does not exclude nor support the diagnosis of epilepsy.  Osias Resnick OBarbra Sarks

## 2022-08-10 NOTE — Discharge Instructions (Signed)
Instructions after Total Knee Replacement   Brittany Nguyen, Jr., M.D.     Dept. of Orthopaedics & Sports Medicine  [facility]  [address]  [place], [place]  [address]  Phone: [phone]   Fax: [phone]    DIET: Drink plenty of non-alcoholic fluids. Resume your normal diet. Include foods high in fiber.  ACTIVITY:  You may use crutches or a walker with weight-bearing as tolerated, unless instructed otherwise. You may be weaned off of the walker or crutches by your Physical Therapist.  Do NOT place pillows under the knee. Anything placed under the knee could limit your ability to straighten the knee.   Continue doing gentle exercises. Exercising will reduce the pain and swelling, increase motion, and prevent muscle weakness.   Please continue to use the TED compression stockings for 6 weeks. You may remove the stockings at night, but should reapply them in the morning. Do not drive or operate any equipment until instructed.  WOUND CARE:  Continue to use the PolarCare or ice packs periodically to reduce pain and swelling. You may bathe or shower after the staples are removed at the first office visit following surgery.  MEDICATIONS: You may resume your regular medications. Please take the pain medication as prescribed on the medication. Do not take pain medication on an empty stomach. You have been given a prescription for a blood thinner (Lovenox or Coumadin). Please take the medication as instructed. (NOTE: After completing a 2 week course of Lovenox, take one Enteric-coated aspirin once a day. This along with elevation will help reduce the possibility of phlebitis in your operated leg.) Do not drive or drink alcoholic beverages when taking pain medications.  CALL THE OFFICE FOR: Temperature above 101 degrees Excessive bleeding or drainage on the dressing. Excessive swelling, coldness, or paleness of the toes. Persistent nausea and vomiting.  FOLLOW-UP:  You  should have an appointment to return to the office in 10-14 days after surgery. Arrangements have been made for continuation of Physical Therapy (either home therapy or outpatient therapy).   [facility] Department Directory         [email or web address]       [email or web address]          Cardiology  Appointments: [place] - [phone] [place] - [phone]  Endocrinology  Appointments: [place] - [phone] [place] - [phone]  Gastroenterology  Appointments: [place] - [phone] [place] - [phone]        General Surgery   Appointments: [place] - [phone]  Internal Medicine/Family Medicine  Appointments: [place] - [phone] [place] - [phone] [place] - [phone]  Metabolic and Weigh Loss Surgery  Appointments: [place] - [phone]        Neurology  Appointments: [place] - [phone] [place] - [phone]  Neurosurgery  Appointments: [place] - [phone]  Obstetrics & Gynecology  Appointments: [place] - [phone] [place] - [phone]        Pediatrics  Appointments: [place] - [phone] [place] - [phone]  Physiatry  Appointments: [place] -[phone]  Physical Therapy  Appointments: [place] - [phone] [place] - [phone]        Podiatry  Appointments: [place] - [phone] [place] - [phone]  Pulmonology  Appointments: [place] - [phone]  Rheumatology  Appointments: [place] - [phone]         Location: [facility]  [address] , [place]  [address]  [place] Location: [facility] [address] [place], [place]  [address]  [place] Location: [facility] [address] [place], [place]  [address]    

## 2022-08-12 ENCOUNTER — Encounter
Admission: RE | Admit: 2022-08-12 | Discharge: 2022-08-12 | Disposition: A | Discharge status: Home / Self Care | Payer: Medicare Other | Source: Ambulatory Visit | Attending: Orthopedic Surgery | Admitting: Orthopedic Surgery

## 2022-08-12 VITALS — BP 95/71 | Resp 16 | Ht 65.0 in | Wt 198.2 lb

## 2022-08-12 DIAGNOSIS — Z01812 Encounter for preprocedural laboratory examination: Secondary | ICD-10-CM | POA: Diagnosis present

## 2022-08-12 DIAGNOSIS — E119 Type 2 diabetes mellitus without complications: Secondary | ICD-10-CM | POA: Diagnosis not present

## 2022-08-12 DIAGNOSIS — Z01818 Encounter for other preprocedural examination: Secondary | ICD-10-CM

## 2022-08-12 HISTORY — DX: Abnormal electrocardiogram (ECG) (EKG): R94.31

## 2022-08-12 HISTORY — DX: Nausea with vomiting, unspecified: R11.2

## 2022-08-12 HISTORY — DX: Unstable angina: I20.0

## 2022-08-12 HISTORY — DX: Personal history of urinary calculi: Z87.442

## 2022-08-12 HISTORY — DX: Post covid-19 condition, unspecified: U09.9

## 2022-08-12 HISTORY — DX: Chronic cough: R05.3

## 2022-08-12 HISTORY — DX: Polyneuropathy, unspecified: G62.9

## 2022-08-12 HISTORY — DX: Obesity, unspecified: E66.9

## 2022-08-12 HISTORY — DX: Gastro-esophageal reflux disease without esophagitis: K21.9

## 2022-08-12 HISTORY — DX: Nausea with vomiting, unspecified: Z98.890

## 2022-08-12 HISTORY — DX: Hypothyroidism, unspecified: E03.9

## 2022-08-12 HISTORY — DX: Acute myocardial infarction, unspecified: I21.9

## 2022-08-12 HISTORY — DX: Atherosclerotic heart disease of native coronary artery without angina pectoris: I25.10

## 2022-08-12 HISTORY — DX: Presence of automatic (implantable) cardiac defibrillator: Z95.810

## 2022-08-12 HISTORY — DX: Other cardiomyopathies: I42.8

## 2022-08-12 HISTORY — DX: Urinary tract infection, site not specified: N39.0

## 2022-08-12 HISTORY — DX: Unspecified osteoarthritis, unspecified site: M19.90

## 2022-08-12 HISTORY — DX: Obstructive sleep apnea (adult) (pediatric): G47.33

## 2022-08-12 HISTORY — DX: Migraine, unspecified, not intractable, without status migrainosus: G43.909

## 2022-08-12 HISTORY — DX: Other malignant neuroendocrine tumors: C7A.8

## 2022-08-12 HISTORY — DX: Hypokalemia: E87.6

## 2022-08-12 LAB — SURGICAL PCR SCREEN
MRSA, PCR: NEGATIVE
Staphylococcus aureus: POSITIVE — AB

## 2022-08-12 LAB — URINALYSIS, ROUTINE W REFLEX MICROSCOPIC
Bacteria, UA: NONE SEEN
Bilirubin Urine: NEGATIVE
Glucose, UA: NEGATIVE mg/dL
Hgb urine dipstick: NEGATIVE
Ketones, ur: NEGATIVE mg/dL
Nitrite: NEGATIVE
Protein, ur: NEGATIVE mg/dL
Specific Gravity, Urine: 1.008 (ref 1.005–1.030)
pH: 6 (ref 5.0–8.0)

## 2022-08-12 LAB — TYPE AND SCREEN
ABO/RH(D): O NEG
Antibody Screen: NEGATIVE

## 2022-08-12 LAB — SEDIMENTATION RATE: Sed Rate: 25 mm/hr (ref 0–30)

## 2022-08-12 LAB — C-REACTIVE PROTEIN: CRP: 0.5 mg/dL (ref <1.0)

## 2022-08-12 NOTE — Patient Instructions (Addendum)
Your procedure is scheduled on:08-24-22 Wednesday Report to the Registration Desk on the 1st floor of the [facility].Then proceed to the 2nd floor Surgery Desk To find out your arrival time, please call [phone] between 1PM - 3PM on:08-23-22 Tuesday If your arrival time is 6:00 am, do not arrive prior to that time as the [facility] entrance doors do not open until 6:00 am.  REMEMBER: Instructions that are not followed completely may result in serious medical risk, up to and including death; or upon the discretion of your surgeon and anesthesiologist your surgery may need to be rescheduled.  Do not eat food after midnight the night before surgery.  No gum chewing, lozengers or hard candies.  You may however, drink Water up to 2 hours before you are scheduled to arrive for your surgery. Do not drink anything within 2 hours of your scheduled arrival time.  Type 1 and Type 2 diabetics should only drink water.  In addition, your doctor has ordered for you to drink the provided  Gatorade G2 Drinking this carbohydrate drink up to two hours before surgery helps to reduce insulin resistance and improve patient outcomes. Please complete drinking 2 hours prior to scheduled arrival time.  TAKE THESE MEDICATIONS THE MORNING OF SURGERY WITH A SIP OF WATER: -escitalopram (LEXAPRO)  -levothyroxine (SYNTHROID)  -metoprolol succinate (TOPROL-XL)  -Lacosamide   Continue your Fentanyl Patch as usual  One week prior to surgery: Stop Anti-inflammatories (NSAIDS) such as Advil, Aleve, Ibuprofen, Motrin, Naproxen, Naprosyn and Aspirin based products such as Excedrin, Goodys Powder, BC Powder.You may however,  take Tylenol/Percocet/Morphine if needed for pain up until the day of surgery.  Stop ANY OVER THE COUNTER supplements/vitamins 7 days prior to surgery  No Alcohol for 24 hours before or after surgery.  No Smoking including e-cigarettes for 24 hours prior to surgery.  No chewable tobacco  products for at least 6 hours prior to surgery.  No nicotine patches on the day of surgery.  Do not use any "recreational" drugs for at least a week prior to your surgery.  Please be advised that the combination of cocaine and anesthesia may have negative outcomes, up to and including death. If you test positive for cocaine, your surgery will be cancelled.  On the morning of surgery brush your teeth with toothpaste and water, you may rinse your mouth with mouthwash if you wish. Do not swallow any toothpaste or mouthwash.  Use CHG Soap as directed on instruction sheet.  Do not wear jewelry, make-up, hairpins, clips or nail polish.  Do not wear lotions, powders, or perfumes.   Do not shave body from the neck down 48 hours prior to surgery just in case you cut yourself which could leave a site for infection.  Also, freshly shaved skin may become irritated if using the CHG soap.  Contact lenses, hearing aids and dentures may not be worn into surgery.  Do not bring valuables to the hospital. [facility] is not responsible for any missing/lost belongings or valuables.   Bring your C-PAP to the hospital with you  Notify your doctor if there is any change in your medical condition (cold, fever, infection).  Wear comfortable clothing (specific to your surgery type) to the hospital.  After surgery, you can help prevent lung complications by doing breathing exercises.  Take deep breaths and cough every 1-2 hours. Your doctor may order a device called an Incentive Spirometer to help you take deep breaths. When coughing or sneezing, hold a  pillow firmly against your incision with both hands. This is called "splinting." Doing this helps protect your incision. It also decreases belly discomfort.  If you are being admitted to the hospital overnight, leave your suitcase in the car. After surgery it may be brought to your room.  If you are being discharged the day of surgery, you will not be  allowed to drive home. You will need a responsible adult (18 years or older) to drive you home and stay with you that night.   If you are taking public transportation, you will need to have a responsible adult (18 years or older) with you. Please confirm with your physician that it is acceptable to use public transportation.   Please call the [facility] Dept. at [phone] if you have any questions about these instructions.  Surgery Visitation Policy:  Patients undergoing a surgery or procedure may have two family members or support persons with them as long as the person is not COVID-19 positive or experiencing its symptoms.   Inpatient Visitation:    Visiting hours are 7 a.m. to 8 p.m. Up to four visitors are allowed at one time in a patient room. The visitors may rotate out with other people during the day. One designated support person (adult) may remain overnight.  Due to an increase in RSV and influenza rates and associated hospitalizations, children ages 38 and under will not be able to visit patients in [facility]. Masks continue to be strongly recommended.   How to Use an Incentive Spirometer An incentive spirometer is a tool that measures how well you are filling your lungs with each breath. Learning to take long, deep breaths using this tool can help you keep your lungs clear and active. This may help to reverse or lessen your chance of developing breathing (pulmonary) problems, especially infection. You may be asked to use a spirometer: After a surgery. If you have a lung problem or a history of smoking. After a long period of time when you have been unable to move or be active. If the spirometer includes an indicator to show the highest number that you have reached, your health care provider or respiratory therapist will help you set a goal. Keep a log of your progress as told by your health care provider. What are the risks? Breathing too quickly  may cause dizziness or cause you to pass out. Take your time so you do not get dizzy or light-headed. If you are in pain, you may need to take pain medicine before doing incentive spirometry. It is harder to take a deep breath if you are having pain. How to use your incentive spirometer  Sit up on the edge of your bed or on a chair. Hold the incentive spirometer so that it is in an upright position. Before you use the spirometer, breathe out normally. Place the mouthpiece in your mouth. Make sure your lips are closed tightly around it. Breathe in slowly and as deeply as you can through your mouth, causing the piston or the ball to rise toward the top of the chamber. Hold your breath for 3-5 seconds, or for as long as possible. If the spirometer includes a coach indicator, use this to guide you in breathing. Slow down your breathing if the indicator goes above the marked areas. Remove the mouthpiece from your mouth and breathe out normally. The piston or ball will return to the bottom of the chamber. Rest for a few seconds, then repeat  the steps 10 or more times. Take your time and take a few normal breaths between deep breaths so that you do not get dizzy or light-headed. Do this every 1-2 hours when you are awake. If the spirometer includes a goal marker to show the highest number you have reached (best effort), use this as a goal to work toward during each repetition. After each set of 10 deep breaths, cough a few times. This will help to make sure that your lungs are clear. If you have an incision on your chest or abdomen from surgery, place a pillow or a rolled-up towel firmly against the incision when you cough. This can help to reduce pain while taking deep breaths and coughing. General tips When you are able to get out of bed: Walk around often. Continue to take deep breaths and cough in order to clear your lungs. Keep using the incentive spirometer until your health care provider says  it is okay to stop using it. If you have been in the hospital, you may be told to keep using the spirometer at home. Contact a health care provider if: You are having difficulty using the spirometer. You have trouble using the spirometer as often as instructed. Your pain medicine is not giving enough relief for you to use the spirometer as told. You have a fever. Get help right away if: You develop shortness of breath. You develop a cough with bloody mucus from the lungs. You have fluid or blood coming from an incision site after you cough. Summary An incentive spirometer is a tool that can help you learn to take long, deep breaths to keep your lungs clear and active. You may be asked to use a spirometer after a surgery, if you have a lung problem or a history of smoking, or if you have been inactive for a long period of time. Use your incentive spirometer as instructed every 1-2 hours while you are awake. If you have an incision on your chest or abdomen, place a pillow or a rolled-up towel firmly against your incision when you cough. This will help to reduce pain. Get help right away if you have shortness of breath, you cough up bloody mucus, or blood comes from your incision when you cough. This information is not intended to replace advice given to you by your health care provider. Make sure you discuss any questions you have with your health care provider. Document Revised: 11/11/2019 Document Reviewed: 11/11/2019 Elsevier Patient Education  [employer].

## 2022-08-13 LAB — HEMOGLOBIN A1C
Hgb A1c MFr Bld: 6.3 % — ABNORMAL HIGH (ref 4.8–5.6)
Mean Plasma Glucose: 134 mg/dL

## 2022-08-16 LAB — IGE: IgE (Immunoglobulin E), Serum: 4 IU/mL — ABNORMAL LOW (ref 6–495)

## 2022-08-17 ENCOUNTER — Encounter: Payer: Self-pay | Admitting: Orthopedic Surgery

## 2022-08-17 DIAGNOSIS — I509 Heart failure, unspecified: Secondary | ICD-10-CM | POA: Insufficient documentation

## 2022-08-17 DIAGNOSIS — A4902 Methicillin resistant Staphylococcus aureus infection, unspecified site: Secondary | ICD-10-CM | POA: Insufficient documentation

## 2022-08-17 DIAGNOSIS — Z86718 Personal history of other venous thrombosis and embolism: Secondary | ICD-10-CM | POA: Insufficient documentation

## 2022-08-18 ENCOUNTER — Encounter: Payer: Self-pay | Admitting: Orthopedic Surgery

## 2022-08-18 NOTE — Progress Notes (Signed)
Perioperative Services  Pre-Admission/Anesthesia Testing Clinical Review  Date: 08/22/22  Patient Demographics:  Name: Brittany Nguyen DOB:   [birth year] MRN:   [id number]  Planned Surgical Procedure(s):    Case: [id number] Date/Time: 08/24/22 0700   Procedure: COMPUTER ASSISTED TOTAL KNEE ARTHROPLASTY (Left: Knee)   Anesthesia type: Choice   Pre-op diagnosis: PRIMARY OSTEOARTHRITIS OF LEFT KNEE.   Location: [facility] OR ROOM 01 / [place] ORS FOR ANESTHESIA GROUP   Surgeons: Dereck Leep, MD   NOTE: Available PAT nursing documentation and vital signs have been reviewed. Clinical nursing staff has updated patient's PMH/PSHx, current medication list, and drug allergies/intolerances to ensure comprehensive history available to assist in medical decision making as it pertains to the aforementioned surgical procedure and anticipated anesthetic course. Extensive review of available clinical information performed. [place] PMH and PSHx updated with any diagnoses/procedures that  may have been inadvertently omitted during her intake with the pre-admission testing department's nursing staff.  Clinical Discussion:  Brittany Nguyen is a 56 y.o. female who is submitted for pre-surgical anesthesia review and clearance prior to her undergoing the above procedure. Patient has never been a smoker. Pertinent PMH includes: HFrEF, anterolateral MI, PAF, NICM (s/p AICD placement) unstable angina, prolonged QT interval, DVT, T2DM, hypothyroidism, asthma, COPD, OSAH (requires nocturnal PAP therapy), GERD (no daily Tx), seizures, IDA, neuropathy, nephrolithiasis, neuroendocrine tumor (stomach), RLS, chronic opioid therapy, anxiety, depression.  Patient is followed by cardiology Karle Barr, MD). She was last seen in the cardiology clinic on 05/23/2022; notes reviewed.  At the time of her clinic visit, patient reporting fluid retention that was most notable in her hands and ankles.  Weight has been up to a maximum of  204 pounds.  Home furosemide has been increased and patient presented to clinic at 192 pounds.  Patient with some mild intermittent episodes of shortness of breath, which she also noted to improve with the additional diuretic dose.  Patient experiencing orthopnea and having to sleep with additional pillows under her head at night.  She denied any chest pain, PND, palpitations, vertiginous symptoms, or presyncope/syncope.  Patient with a past medical history significant for cardiovascular diagnoses.  Patient underwent diagnostic LEFT heart catheterization on 10/21/2014.  Coronary anatomy noted be normal with no evidence of obstructive CAD.  Patient diagnosed with nonischemic cardiomyopathy and 12/2016, at which time her EF was 25 to 30% by echocardiogram.  She underwent diagnostic RIGHT/LEFT heart catheterization on 02/24/2017, again revealing normal coronary anatomy and no evidence of obstructive CAD.  Hemodynamics: Mean RA = 12 mmHg, mean PA = 28 mmHg, mean PCWP = 15 mmHg; LVEDP = 16 mmHg, CO = 8 L/min, CI = 3.8 L/min/m.  Given worsening ejection fraction associated with her known NICM, patient underwent placement of a Medtronic Evera MRI XT DR SureScan AICD device on 03/29/2017.  Device is regularly interrogated by her electrophysiology team.  Device last interrogated on 07/14/2022 at which time it was noted to be functioning properly.  Patient is not pacemaker dependent.  Cardiac MRI performed on 11/28/2017 provided a limited examination due to susceptibility artifact caused by the implanted AICD to her LEFT upper chest wall. Decreased qualitative contractility of the left ventricle noted as evidenced by hypokinesis and dyskinetic anteroseptal, inferoseptal, and inferior walls.   Most recent TTE was performed on 06/14/2022 revealing a severely decreased left ventricular systolic function with an EF of 25-30%.  There were no regional wall motion abnormalities. Left ventricular diastolic Doppler  parameters consistent with abnormal relaxation (G1DD).  Right ventricular size and function normal.  There was trivial mitral valve regurgitation.  There was no evidence of any significant transvalvular gradient suggestive of valvular stenosis.  Blood pressure documented as being low at 91/67 mmHg.  For her HFrEF and NICM diagnoses, patient is on diuretic (furosemide), ARB (losartan), and beta-blocker (metoprolol succinate) therapies.  Patient not currently taking any type of lipid-lowering therapies for ASCVD prevention.  T2DM well-controlled on currently prescribed regimen; last HgbA1c was 6.3% when checked on 08/12/2022.  Patient does have an OSAH diagnosis, however is not always compliant with her prescribed nocturnal PAP therapy.  Functional capacity limited by arthritides and multiple medical comorbidities.  With that being said, patient still felt to be able to achieve at least 4 METS of physical activity without experiencing any significant angina/anginal equivalent symptoms.  Patient also followed by neurology for a history of generalized epilepsy. She was last seen in the neurology clinic on 02/15/2022; notes reviewed. Patient had experienced recent seizures in 01/2022. More recently patient with seizure on 07/30/2022. Seizure activity abated en route to the hospital with administration of midazolam 5 mg IM x 2 by EMS. Patient with AMS upon arrival. CT of the head was negative. She was found to be hypokalemic at 2.6 mmol/L. Decision was made to admit patient for correction of noted electrolyte derangement. Neurology consulted during admission. EEG ordered and resulted as WNL/negative. Patient was discharged home in stable condition ion 08/01/2022 on lacosamide +  levetiracetam with plans for outpatient follow up with neurology. Of note, during PAT interview, patient reported that she was only taking her lacosamide at this time. Note sent to neurology to make them aware that patient not taking   levetiracetam and that follow up was recommended at time of recent hospital discharge.    Brittany Nguyen is scheduled for an elective COMPUTER ASSISTED LEFT TOTAL KNEE ARTHROPLASTY on 08/24/2022 with Dr. Skip Estimable, MD. In preparation for her procedure, patient scheduled to undergo IV filter placement on 08/23/2022 with Dr. Hortencia Pilar, MD. Given patient's past medical history significant for cardiovascular and neurological diagnoses, pre-procedural/preoperative clearances were requested from both neurology and cardiology. Specialty clearances were obtained as follows:  Per neurology Manuella Ghazi, MD), "patient is cleared to proceed with the planned surgical intervention at an overall MODERATE risk of complications from a neurological perspective".   Per cardiology Morton Stall, ANP-C), "patient is at a MODERATE risk for a cardiac event during total knee arthroplasty.  She has NICM with recent LVEF of 25-30% (06/14/2022) with reasonable symptom management.  She often presents to the ED either with hypo or hypervolemia related to her GI carcinoid tumor.  LHC in 02/2017 showed no evidence of CAD.  Cardiac MRI 11/2017 without scarring or evidence of sarcoid.  I recommend very minimal fluid administration and close monitoring of her volume status postoperatively".   In review of her medication reconciliation, the patient is not noted to be taking any type of anticoagulation or antiplatelet therapies that would need to be held during her perioperative course. Patient denies previous perioperative complications with anesthesia in the past. Patient has a PMH (+) for PONV. Symptoms and history of PONV will be discussed with patient by anesthesia team on the day of her procedure. Interventions will be ordered as deemed necessary based on patient's individual care needs as determined by anesthesiologist. In review of the available records, it is noted that patient underwent [facility] in 05/2022 without documented complications.  08/22/2022   10:08 AM 08/12/2022   11:22 AM 08/01/2022    1:00 PM  Vitals with BMI  Height _0  _1    Weight 200 lbs 198 lbs 3 oz   BMI 01.77 93.90   Systolic 300 95 923  Diastolic 79 71 70  Pulse 87  70    Providers/Specialists:   NOTE: Primary physician provider listed below. Patient may have been seen by APP or partner within same practice.   PROVIDER ROLE / SPECIALTY LAST OV  Hooten, Laurice Record, MD Orthopedics (Surgeon) 08/16/2022  [name], MD Primary Care Provider 04/14/2022  Loletha Grayer, MD Cardiology 06/14/2022  Leonie Green, MD Oncology 08/12/2022  Jennings Books, MD Neurology 02/15/2022   Allergies:  Contrast media [iodinated contrast media], Ferumoxytol, Iron sucrose, Lidocaine, Metrizamide, Penicillins, Sacubitril-valsartan, Isosorbide nitrate, Latex, Ondansetron, Tizanidine, Povidone-iodine, and Pulmicort [budesonide]  Current Home Medications:   No current facility-administered medications for this encounter.    albuterol (ACCUNEB) 1.25 MG/3ML nebulizer solution   albuterol (VENTOLIN HFA) 108 (90 Base) MCG/ACT inhaler   escitalopram (LEXAPRO) 10 MG tablet   fentaNYL (DURAGESIC) 75 MCG/HR   furosemide (LASIX) 40 MG tablet   gabapentin (NEURONTIN) 100 MG capsule   Lacosamide 100 MG TABS   levothyroxine (SYNTHROID) 175 MCG tablet   LORazepam (ATIVAN) 1 MG tablet   losartan (COZAAR) 25 MG tablet   metoprolol succinate (TOPROL-XL) 50 MG 24 hr tablet   morphine (MSIR) 15 MG tablet   naloxone (NARCAN) nasal spray 4 mg/0.1 mL   nitroGLYCERIN (NITROSTAT) 0.4 MG SL tablet   oxyCODONE-acetaminophen (PERCOCET/ROXICET) 5-325 MG tablet   Potassium Chloride ER 20 MEQ TBCR   promethazine (PHENERGAN) 25 MG tablet   rOPINIRole (REQUIP) 4 MG tablet   History:   Past Medical History:  Diagnosis Date   Acute anterolateral wall MI ([place]) 08/2016   AICD (automatic  cardioverter/defibrillator) present 03/29/2017   a.) Medtronic Evera MRI XT DR SureScan (SN: [id number])   Anxiety    Arthritis    Asthma 2013   C. 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   a.) Bx 02/22/2018 --> NE tumor cells --> AE1/AE3, chromogranin, synaptophysin (+); Ki-67 proliferation rate <1%; b.) repeat Bx 08/24/2018 --> well differentiard NET (G1)   Chronic, continuous use of opioids    a.) has naloxone Rx available   COPD (chronic obstructive pulmonary disease) ([place])    Depression    Diet-controlled type 2 diabetes mellitus ([place])    Diverticulitis 2010   DVT (deep venous thrombosis) (HCC)    Endometriosis 1990   Generalized epilepsy ([place])    a.) on lacosamide; b.) last seizure 07-30-22 in setting of hypokalemia (K+ 2.6)   GERD (gastroesophageal reflux disease)    GIB (gastrointestinal bleeding) 01/08/2018   HFrEF (heart failure with reduced ejection fraction) ([place])    a.)TTE 12/09/16: EF 25-30%, dif HK; b.) TTE 02/08/17: EF 25%, dif HK, mild TR, G1DD; c.) TTE 04/25/17: EF 30-35%, mild LVH; d.) TTE 01/30/18: EF 45%, MAC, LAE, mild MR, G1DD; e.) TTE 11/06/2018: EF 25%, sev glob HK, triv MR/TR/PR; f.) TTE 05/12/19: EF 35%, MAC, AoV sclerosis, G1DD; g.) TTE 11/05/19: EF 20%, sev glob HK, triv MR; h.) TTE 06/17/20: EF 40%, mod glob HK, triv MR; I:) TTE 06/14/22: EF 25-30%, G1DD   History of cardiac catheterization    a.) LHC 10/21/2014: normal coronaries; b.) [facility] 02/24/2017: normal coronaries, mRA 12, mPA 28, mPCWP 15, CO 8.0, CI 3.8   History of  kidney stones    Hx MRSA infection    Hypokalemia    Hypothyroidism    Iron deficiency anemia 03/27/2018   Lump or mass in breast 11/27/2012   a.) Bx 11/27/2012 --> apocrine wall cyst   Migraine    Nausea & vomiting    Neuropathy    Non-ischemic cardiomyopathy ([place])    a.) TTE 12/09/2016: EF 25-30%, b.) TTE 02/08/2017: EF 25%; c.) Medtronic AICD placed 03/29/2017; d.) TTE 04/25/2017: EF 30-35%;  e.) TTE 01/30/2018: EF 45%; f.) TTE 11/06/2018: EF 25%; g.) TTE 05/12/2019: EF 35%; h.) TTE 11/05/2019: EF 20%; I.) TTE 06/17/2020: EF 40%; J.) TTE 06/14/2022: EF 25-30%   Obesity    OSA on CPAP    PAF (paroxysmal atrial fibrillation) (HCC)    PONV (postoperative nausea and vomiting)    Post-COVID chronic cough    Postcholecystectomy diarrhea    Prolonged Q-T interval on ECG    Restless leg    a.) on ropinirole   Unstable angina ([place])    Past Surgical History:  Procedure Laterality Date   ABDOMINAL HYSTERECTOMY     age 18   BREAST BIOPSY Right 2014   benign   CARDIAC DEFIBRILLATOR PLACEMENT Left 03/29/2017   Procedure: [place] (AICD); Location: [facility]; Surgeon: Burnett Sheng, MD   CESAREAN SECTION     CHOLECYSTECTOMY     COLECTOMY     COLONOSCOPY WITH PROPOFOL N/A 02/22/2018   Procedure: COLONOSCOPY WITH PROPOFOL;  Surgeon: Toledo, Benay Pike, MD;  Location: [facility] ENDOSCOPY;  Service: Gastroenterology;  Laterality: N/A;   ESOPHAGOGASTRODUODENOSCOPY (EGD) WITH PROPOFOL N/A 02/22/2018   Procedure: ESOPHAGOGASTRODUODENOSCOPY (EGD) WITH PROPOFOL;  Surgeon: Toledo, Benay Pike, MD;  Location: [facility] ENDOSCOPY;  Service: Gastroenterology;  Laterality: N/A;   LEFT HEART CATH AND CORONARY ANGIOGRAPHY Left 10/21/2014   Procedure: LEFT HEART CATH AND CORONARY ANGIOGRAPHY; Location: [facility]; Surgeon: Serafina Royals, MD   NASAL SINUS SURGERY  2012   OOPHORECTOMY     RIGHT/LEFT HEART CATH AND CORONARY ANGIOGRAPHY Bilateral 02/24/2017   Procedure: RIGHT/LEFT HEART CATH AND CORONARY ANGIOGRAPHY; Location: [facility]   THYROIDECTOMY     Family History  Problem Relation Age of Onset   Cancer Mother 70       ovarian   CAD Mother    Cancer Father 25       brain   CAD Father    Cancer Daughter 60       skin   Cancer Maternal Aunt 46       breast   Leukemia Paternal Grandfather    Social History   Tobacco Use   Smoking status: Never   Smokeless tobacco: Never  Vaping  Use   Vaping Use: Never used  Substance Use Topics   Alcohol use: No   Drug use: Never    Pertinent Clinical Results:  LABS: Labs reviewed: Acceptable for surgery.  No visits with results within 3 Day(s) from this visit.  Latest known visit with results is:  Hospital Outpatient Visit on 08/12/2022  Component Date Value Ref Range Status   MRSA, PCR 08/12/2022 NEGATIVE  NEGATIVE Final   Staphylococcus aureus 08/12/2022 POSITIVE (A)  NEGATIVE Final   Comment: (NOTE) The Xpert SA Assay (FDA approved for NASAL specimens in patients 37 years of age and older), is one component of a comprehensive surveillance program. It is not intended to diagnose infection nor to guide or monitor treatment. Performed at [facility], [address], [place], [place] [address]    ABO/RH(D)  08/12/2022 O NEG   Final   Antibody Screen 08/12/2022 NEG   Final   Sample Expiration 08/12/2022 08/26/2022,2359   Final   Extend sample reason 08/12/2022    Final                   Value:NO TRANSFUSIONS OR PREGNANCY IN THE PAST 3 MONTHS Performed at [facility], [address] [place], [place] [address]    Color, Urine 08/12/2022 STRAW (A)  YELLOW Final   APPearance 08/12/2022 CLEAR (A)  CLEAR Final   Specific Gravity, Urine 08/12/2022 1.008  1.005 - 1.030 Final   pH 08/12/2022 6.0  5.0 - 8.0 Final   Glucose, UA 08/12/2022 NEGATIVE  NEGATIVE mg/dL Final   Hgb urine dipstick 08/12/2022 NEGATIVE  NEGATIVE Final   Bilirubin Urine 08/12/2022 NEGATIVE  NEGATIVE Final   Ketones, ur 08/12/2022 NEGATIVE  NEGATIVE mg/dL Final   Protein, ur 08/12/2022 NEGATIVE  NEGATIVE mg/dL Final   Nitrite 08/12/2022 NEGATIVE  NEGATIVE Final   Leukocytes,Ua 08/12/2022 TRACE (A)  NEGATIVE Final   RBC / HPF 08/12/2022 0-5  0 - 5 RBC/hpf Final   WBC, UA 08/12/2022 0-5  0 - 5 WBC/hpf Final   Bacteria, UA 08/12/2022 NONE SEEN  NONE SEEN Final   Squamous Epithelial / LPF 08/12/2022 0-5  0 - 5 Final   Mucus  08/12/2022 PRESENT   Final   Performed at [facility], [address] [place], [place] [address]   CRP 08/12/2022 0.5  <1.0 mg/dL Final   Performed at [facility], [place] [address], [place], [place] [address]   Sed Rate 08/12/2022 25  0 - 30 mm/hr Final   Performed at [facility], [place], [place] [address]   Hgb A1c MFr Bld 08/12/2022 6.3 (H)  4.8 - 5.6 % Final   Comment: (NOTE)         Prediabetes: 5.7 - 6.4         Diabetes: >6.4         Glycemic control for adults with diabetes: <7.0    Mean Plasma Glucose 08/12/2022 134  mg/dL Final   Comment: (NOTE) Performed At: [facility] [place], [place] [id number] Rush Farmer MD [id number]    IgE (Immunoglobulin E), Serum 08/12/2022 4 (L)  6 - 495 IU/mL Final   Comment: (NOTE) Performed At: [facility] [place], [place] [id number] Rush Farmer MD GQ:[id number]     ECG: Date: 07/31/2022 Time ECG obtained: 0019 AM Rate: 78 bpm Rhythm:  Sinus rhythm with nonspecific IVCD Axis (leads I and aVF): Normal Intervals: PR 205 ms. QRS 121 ms. QTc 481 ms. ST segment and T wave changes: No evidence of acute ST segment elevation or depression.  Evidence of an age undetermined anterior infarct present Comparison: Similar to previous tracing obtained on 05/16/2022   IMAGING / PROCEDURES: DIAGNOSTIC RADIOGRAPHS OF RIGHT KNEE 3 VIEWS performed on 07/07/2022 Significant narrowing of the medial cartilage space with bone-on-bone articulation and associated varus alignment.   Osteophyte formation is noted.  Subchondral sclerosis is noted.   Degenerative changes to the patellofemoral articulation are noted.  No evidence of fracture or dislocation.   DIAGNOSTIC RADIOGRAPHS OF LEFT KNEE 1-2 VIEWS performed on 07/07/2022 Significant narrowing of the medial cartilage space with bone-on-bone articulation and associated varus alignment.   Osteophyte  formation is noted.  Subchondral sclerosis is noted.   No evidence of fracture or dislocation.   TRANSTHORACIC  ECHOCARDIOGRAM performed on 06/14/2022 The left ventricle is normal in size with normal wall thickness.  The left ventricular systolic function is severely decreased, LVEF is visually estimated at 25-30%.  There is grade I diastolic dysfunction (impaired relaxation).  The right ventricle is normal in size, with normal systolic function.   Impression and Plan:  Brittany Nguyen has been referred for pre-anesthesia review and clearance prior to her undergoing the planned anesthetic and procedural courses. Available labs, pertinent testing, and imaging results were personally reviewed by me. This patient has been appropriately cleared by cardiology (MODERATE) and neurology (MODERATE) with the individually indicated risk of significant perioperative complications. Completed perioperative prescription for cardiac device management documentation completed by primary cardiology team and placed on patient's chart for review by the surgical/anesthetic team on the day of her procedure. Given that surgery is performed below the umbilicus, there is minimal risk for electromagnetic interference (EMI), thus electrophysiology has indicated that procedure should not interfere with planned surgical procedure.  Magnet can be utilized, without the need for postoperative reprogramming, should there be any concern. Beyond normal perioperative cardiovascular monitoring, there are no recommendations from electrophysiology team that prompt further discussion/recommendations from industry representative.   Based on clinical review performed today (08/22/22), barring any significant acute changes in the patient's overall condition, it is anticipated that she will be able to proceed with the planned surgical intervention. Any acute changes in clinical condition may necessitate her procedure being postponed and/or  cancelled. Patient will meet with anesthesia team (MD and/or CRNA) on the day of her procedure for preoperative evaluation/assessment. Questions regarding anesthetic course will be fielded at that time.   Pre-surgical instructions were reviewed with the patient during her PAT appointment and questions were fielded by PAT clinical staff. Patient was advised that if any questions or concerns arise prior to her procedure then she should return a call to PAT and/or her surgeon's office to discuss.  Honor Loh, MSN, APRN, FNP-C, CEN [facility]  Peri-operative Services Nurse Practitioner Phone: [phone] Fax: [phone] 08/22/22 3:49 PM  NOTE: This note has been prepared using Dragon dictation software. Despite my best ability to proofread, there is always the potential that unintentional transcriptional errors may still occur from this process.

## 2022-08-19 ENCOUNTER — Encounter: Payer: Self-pay | Admitting: Oncology

## 2022-08-19 NOTE — Progress Notes (Signed)
  Perioperative Services Pre-Admission/Anesthesia Testing    Date: 08/19/22  Name: Brittany Nguyen MRN:   [id number]  Re: Neurology clearance for upcoming surgery  Planned Surgical Procedure(s):    Case: [id number] Date/Time: 08/24/22 0700   Procedure: COMPUTER ASSISTED TOTAL KNEE ARTHROPLASTY (Left: Knee)   Anesthesia type: Choice   Pre-op diagnosis: PRIMARY OSTEOARTHRITIS OF LEFT KNEE.   Location: [facility] OR ROOM 01 / [place] [facility]S FOR ANESTHESIA GROUP   Surgeons: Dereck Leep, MD   Clinical Notes:  Patient is scheduled for the above procedure on 08/24/2022 with Dr. Skip Estimable, MD. In preparation for her procedure, patient reviewed by preanesthesia team.  Following clinical review, it was determined that patient required clearance from both her cardiologist and neurologist prior to proceeding with planned elective orthopedic procedure.  Cardiology has signed off on patient proceeding at MODERATE risk at this time.    Due to recent seizure activity requiring admission, neurology asking that patient be seen urgently by their service to help further risk stratify patient prior to surgery.  Again, patient had breakthrough seizures despite antiepileptic medication, however this was noted to be in the setting of an electrolyte derangement.  She has had no further seizures since hypokalemia was corrected.  Interestingly enough, patient was discharged home on levetiracetam (in addition to her scheduled lacosamide), however per reports to our office, she is not taking this medication. Dr. Manuella Ghazi advising that patient will be seen over the next couple of days in order to ensure that patient safely able to proceed with surgery as planned on 08/24/2022.  Impression and Plan:  [name] is scheduled for surgery as per above. In the setting of recent seizures, patient will require neurology to sign off on her proceeding with planned elective surgery.  I will plan on keeping surgeon updated should  neurology have any recommendations against proceeding and/or if they require further testing. No changes are being made to the OR schedule at this time.  Will send copy of this note to primary attending surgeon Marry Guan, MD) to make him aware of the plan of care as it stands at this point.    Honor Loh, MSN, APRN, FNP-C, CEN [facility]  Peri-operative Services Nurse Practitioner Phone: [phone] 08/19/22 9:36 AM  NOTE: This note has been prepared using Dragon dictation software. Despite my best ability to proofread, there is always the potential that unintentional transcriptional errors may still occur from this process.

## 2022-08-22 ENCOUNTER — Encounter (INDEPENDENT_AMBULATORY_CARE_PROVIDER_SITE_OTHER): Payer: Self-pay | Admitting: Nurse Practitioner

## 2022-08-22 ENCOUNTER — Ambulatory Visit (INDEPENDENT_AMBULATORY_CARE_PROVIDER_SITE_OTHER): Payer: Medicare Other | Admitting: Nurse Practitioner

## 2022-08-22 ENCOUNTER — Telehealth (INDEPENDENT_AMBULATORY_CARE_PROVIDER_SITE_OTHER): Payer: Self-pay

## 2022-08-22 VITALS — BP 108/79 | HR 87 | Resp 16 | Ht 65.0 in | Wt 200.0 lb

## 2022-08-22 DIAGNOSIS — I10 Essential (primary) hypertension: Secondary | ICD-10-CM

## 2022-08-22 DIAGNOSIS — I82409 Acute embolism and thrombosis of unspecified deep veins of unspecified lower extremity: Secondary | ICD-10-CM

## 2022-08-22 NOTE — Telephone Encounter (Signed)
Patient was seen in office and is scheduled with Dr. Delana Meyer for a IVC filter placement on 08/23/22 with [facility] 2:00 pm [facility]. Pre-procedure instructions were discussed and handed to patient.

## 2022-08-22 NOTE — Progress Notes (Signed)
 Subjective:    Patient ID: Brittany Nguyen, female    DOB: 07/14/1966, 56 y.o.   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  DVT was identified years ago and was treated with anticoagulation.  The presenting symptoms were pain and swelling in the lower extremity.  This was about 10 to 15 years ago  Currently the patient does not have significant issues post DVT.  The patient has not been using compression therapy at this point.  No SOB or pleuritic chest pains.  No cough or hemoptysis.  No blood per rectum or blood in any sputum.  No excessive bruising per the patient.    No recent shortening of the patient's walking distance or new symptoms consistent with claudication.  No history of rest pain symptoms. No new ulcers or wounds of the lower extremities have occurred.  The patient denies amaurosis fugax or recent TIA symptoms. There are no recent neurological changes noted. No recent episodes of angina or shortness of breath documented.     Review of Systems  All other systems reviewed and are negative.      Objective:   Physical Exam Vitals reviewed.  HENT:     Head: Normocephalic.  Cardiovascular:     Rate and Rhythm: Normal rate.     Pulses: Normal pulses.  Pulmonary:     Effort: Pulmonary effort is normal.  Skin:    General: Skin is warm and dry.  Neurological:     Mental Status: She is alert and oriented to person, place, and time.  Psychiatric:        Mood and Affect: Mood normal.        Behavior: Behavior normal.        Thought Content: Thought content normal.        Judgment: Judgment normal.     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   a.) Bx 02/22/2018 --> NE tumor cells --> AE1/AE3, chromogranin, synaptophysin (+); Ki-67 proliferation rate <1%; b.) repeat Bx 08/24/2018 --> well differentiard NET (G1)   Chronic, continuous use of opioids    a.) has naloxone Rx available   COPD (chronic obstructive pulmonary disease) (HCC)    Depression    Diet-controlled type 2 diabetes mellitus (HCC)    Diverticulitis 2010   DVT (deep venous thrombosis) (HCC)    Endometriosis 1990   Generalized epilepsy (HCC)    a.) on lacosamide; b.) last seizure 07-30-22 in setting of hypokalemia (K+ 2.6)   GERD (gastroesophageal reflux disease)    GIB (gastrointestinal bleeding) 01/08/2018   HFrEF (heart failure with reduced ejection fraction) (HCC)    a.)TTE 12/09/16: EF 25-30%, dif HK; b.) TTE 02/08/17: EF 25%, dif HK, mild TR, G1DD;   c.) TTE 04/25/17: EF 30-35%, mild LVH; d.) TTE 01/30/18: EF 45%, MAC, LAE, mild MR, G1DD; e.) TTE 11/06/2018: EF 25%, sev glob HK, triv MR/TR/PR; f.) TTE 05/12/19: EF 35%, MAC, AoV sclerosis, G1DD; g.) TTE 11/05/19: EF 20%, sev glob HK, triv MR; h.) TTE 06/17/20: EF 40%, mod glob HK, triv MR; I:) TTE 06/14/22: EF 25-30%, G1DD   History of cardiac catheterization    a.) LHC 10/21/2014: normal coronaries; b.) R/LHC 02/24/2017: normal coronaries, mRA 12, mPA 28, mPCWP 15, CO 8.0, CI 3.8   History of kidney stones    Hx MRSA infection    Hypokalemia    Hypothyroidism    Iron deficiency anemia 03/27/2018   Lump or mass in breast 11/27/2012   a.) Bx 11/27/2012 --> apocrine wall cyst   Migraine    Nausea & vomiting    Neuropathy    Non-ischemic cardiomyopathy ([place])     a.) TTE 12/09/2016: EF 25-30%, b.) TTE 02/08/2017: EF 25%; c.) Medtronic AICD placed 03/29/2017; d.) TTE 04/25/2017: EF 30-35%; e.) TTE 01/30/2018: EF 45%; f.) TTE 11/06/2018: EF 25%; g.) TTE 05/12/2019: EF 35%; h.) TTE 11/05/2019: EF 20%; I.) TTE 06/17/2020: EF 40%; J.) TTE 06/14/2022: EF 25-30%   Obesity    OSA on CPAP    PAF (paroxysmal atrial fibrillation) (HCC)    PONV (postoperative nausea and vomiting)    Post-COVID chronic cough    Postcholecystectomy diarrhea    Prolonged Q-T interval on ECG    Restless leg    a.) on ropinirole   Unstable angina (HCC)     Social History   Socioeconomic History   Marital status: Married    Spouse name: Not on file   Number of children: Not on file   Years of education: Not on file   Highest education level: Not on file  Occupational History   Occupation: works at [employer].  Tobacco Use   Smoking status: Never   Smokeless tobacco: Never  Vaping Use   Vaping Use: Never used  Substance and Sexual Activity   Alcohol use: No   Drug use: Never   Sexual activity: Yes    Birth control/protection: Post-menopausal  Other Topics Concern   Not on file  Social History Narrative   Not on file   Social Determinants of Health   Financial Resource Strain: Not on file  Food Insecurity: No Food Insecurity (07/31/2022)   Hunger Vital Sign    Worried About Running Out of Food in the Last Year: Never true    Ran Out of Food in the Last Year: Never true  Transportation Needs: No Transportation Needs (07/31/2022)   PRAPARE - Hydrologist (Medical): No    Lack of Transportation (Non-Medical): No  Physical Activity: Not on file  Stress: Not on file  Social Connections: Not on file  Intimate Partner Violence: Not At Risk (07/31/2022)   Humiliation, Afraid, Rape, and Kick questionnaire    Fear of Current or Ex-Partner: No    Emotionally Abused: No    Physically Abused: No    Sexually Abused: No    Past Surgical  History:  Procedure Laterality Date   ABDOMINAL HYSTERECTOMY     age 48   BREAST BIOPSY Right 2014   benign   CARDIAC DEFIBRILLATOR PLACEMENT Left 03/29/2017   Procedure: [place] (AICD); Location: [facility]; Surgeon: Burnett Sheng, MD   [place]  COLONOSCOPY WITH PROPOFOL N/A 02/22/2018   Procedure: COLONOSCOPY WITH PROPOFOL;  Surgeon: Toledo, Benay Pike, MD;  Location: [facility];  Service: Gastroenterology;  Laterality: N/A;   ESOPHAGOGASTRODUODENOSCOPY (EGD) WITH PROPOFOL N/A 02/22/2018   Procedure: ESOPHAGOGASTRODUODENOSCOPY (EGD) WITH PROPOFOL;  Surgeon: Toledo, Benay Pike, MD;  Location: [facility];  Service: Gastroenterology;  Laterality: N/A;   LEFT HEART CATH AND CORONARY ANGIOGRAPHY Left 10/21/2014   Procedure: LEFT HEART CATH AND CORONARY ANGIOGRAPHY; Location: [facility]; Surgeon: Serafina Royals, MD   NASAL SINUS SURGERY  2012   OOPHORECTOMY     RIGHT/LEFT HEART CATH AND CORONARY ANGIOGRAPHY Bilateral 02/24/2017   Procedure: RIGHT/LEFT HEART CATH AND CORONARY ANGIOGRAPHY; Location: [facility]   THYROIDECTOMY      Family History  Problem Relation Age of Onset   Cancer Mother 40       ovarian   CAD Mother    Cancer Father 24       brain   CAD Father    Cancer Daughter 81       skin   Cancer Maternal Aunt 34       breast   Leukemia Paternal Grandfather     Allergies  Allergen Reactions   Contrast Media [Iodinated Contrast Media] Shortness Of Breath    Per CT report in 2014 pt had break through contrast reaction with hives and SOB following 13 hour prep. MSY    Ferumoxytol Nausea Only   Iron Sucrose Anaphylaxis   Lidocaine Hives   Metrizamide Shortness Of Breath   Penicillins Hives and Other (See Comments)    ediate rash, facial/tongue/throat swelling, SOB or lightheadedness with hypotension, tachy   Sacubitril-Valsartan     Sz (unclear if new start entresto vs hypoglycemia)   Isosorbide  Nitrate Other (See Comments)    Headache   Latex Hives   Ondansetron Other (See Comments)    Severe headache   Tizanidine Other (See Comments)    Feels altered   Povidone-Iodine Rash    Blistering rash   Pulmicort [Budesonide] Itching       Latest Ref Rng & Units 08/01/2022    4:59 AM 07/31/2022    3:02 AM 07/30/2022    9:27 PM  CBC  WBC 4.0 - 10.5 K/uL 8.1  7.8  8.1   Hemoglobin 12.0 - 15.0 g/dL 11.5  10.7  11.7   Hematocrit 36.0 - 46.0 % 33.6  31.0  33.9   Platelets 150 - 400 K/uL 180  162  188       CMP     Component Value Date/Time   NA 142 08/01/2022 0459   NA 139 10/03/2014 1838   K 3.7 08/01/2022 0459   K 3.9 10/03/2014 1838   CL 107 08/01/2022 0459   CL 106 10/03/2014 1838   CO2 27 08/01/2022 0459   CO2 28 10/03/2014 1838   GLUCOSE 130 (H) 08/01/2022 0459   GLUCOSE 118 (H) 10/03/2014 1838   BUN 12 08/01/2022 0459   BUN 11 10/03/2014 1838   CREATININE 0.74 08/01/2022 0459   CREATININE 1.03 10/03/2014 1838   CALCIUM 7.7 (L) 08/01/2022 0459   CALCIUM 9.0 10/03/2014 1838   PROT 6.0 (L) 07/31/2022 0302   PROT 7.5 12/14/2012 2028   ALBUMIN 3.1 (L) 07/31/2022 0302   ALBUMIN 3.4 12/14/2012 2028   AST 26 07/31/2022 0302   AST 19 12/14/2012 2028   ALT 32 07/31/2022 0302   ALT 25 12/14/2012 2028   ALKPHOS 81 07/31/2022 0302   ALKPHOS 126 12/14/2012 2028  BILITOT 0.5 07/31/2022 0302   BILITOT 0.1 (L) 12/14/2012 2028   GFRNONAA >60 08/01/2022 0459   GFRNONAA >60 10/03/2014 1838   GFRNONAA 58 (L) 04/16/2014 0525   GFRAA >60 04/28/2020 1342   GFRAA >60 10/03/2014 1838   GFRAA >60 04/16/2014 0525     No results found.     Assessment & Plan:   1. Deep vein thrombosis (DVT) of lower extremity, unspecified chronicity, unspecified laterality, unspecified vein (HCC) Currently the patient is not on long-term anticoagulation and there have not been any problems or complications at this point.  IVC filter is strongly indicated prior to high risk orthopedic  surgery.  Especially given the history of PE / DVT.  IVC filter placement will be done the week for surgery. Risk and benefits were reviewed the patient.  Indications for the procedure were reviewed.  All questions were answered, the patient agrees to proceed.    The patient will follow-up with me 6-8 weeks months after the joint replacement surgery to discuss removal following DVT studies (this was also discussed today and the patient agrees with the plan to have the filter removed).   2. Essential hypertension Continue antihypertensive medications as already ordered, these medications have been reviewed and there are no changes at this time.  Current Outpatient Medications on File Prior to Visit  Medication Sig Dispense Refill   albuterol (ACCUNEB) 1.25 MG/3ML nebulizer solution Take 1 ampule by nebulization every 6 (six) hours as needed for wheezing.     albuterol (VENTOLIN HFA) 108 (90 Base) MCG/ACT inhaler Inhale 1-2 puffs into the lungs every 6 (six) hours as needed for wheezing or shortness of breath.     escitalopram (LEXAPRO) 10 MG tablet Take 10 mg by mouth every morning.     fentaNYL (DURAGESIC) 75 MCG/HR Place 1 patch onto the skin every 3 (three) days.     furosemide (LASIX) 40 MG tablet Take 40 mg by mouth 2 (two) times daily.     gabapentin (NEURONTIN) 100 MG capsule Take 100 mg by mouth at bedtime.     Lacosamide 100 MG TABS Take 1 tablet by mouth 2 (two) times daily.     levothyroxine (SYNTHROID) 175 MCG tablet Take 175 mcg by mouth every morning.     LORazepam (ATIVAN) 1 MG tablet Take 1 tablet (1 mg total) by mouth 4 (four) times daily as needed. (Patient taking differently: Take 1 mg by mouth 4 (four) times daily as needed (stomach cramps/nausea).) 10 tablet 0   losartan (COZAAR) 25 MG tablet Take 12.5 mg by mouth 2 (two) times daily.     metoprolol succinate (TOPROL-XL) 50 MG 24 hr tablet Take 50 mg by mouth 2 (two) times daily.     morphine (MSIR) 15 MG tablet Take 15 mg  by mouth every 4 (four) hours as needed.     naloxone (NARCAN) nasal spray 4 mg/0.1 mL Place 1 spray into the nose as needed for opioid reversal. May repeat once in opposite nare.     nitroGLYCERIN (NITROSTAT) 0.4 MG SL tablet Place 0.4 mg under the tongue every 5 (five) minutes x 3 doses as needed for chest pain.     oxyCODONE-acetaminophen (PERCOCET/ROXICET) 5-325 MG tablet Take 1-2 tablets by mouth every 4 (four) hours as needed (breakthrough pain).     Potassium Chloride ER 20 MEQ TBCR Take 1 tablet by mouth in the morning and at bedtime.     promethazine (PHENERGAN) 25 MG tablet Take 25 mg by mouth every  4 (four) hours as needed for nausea/vomiting.     rOPINIRole (REQUIP) 4 MG tablet Take 4 mg by mouth at bedtime.     No current facility-administered medications on file prior to visit.    There are no Patient Instructions on file for this visit. No follow-ups on file.   Kris Hartmann, NP

## 2022-08-22 NOTE — H&P (View-Only) (Signed)
Subjective:    Patient ID: Brittany Nguyen, female    DOB: 1966-03-04, 56 y.o.   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  DVT was identified years ago and was treated with anticoagulation.  The presenting symptoms were pain and swelling in the lower extremity.  This was about 10 to 15 years ago  Currently the patient does not have significant issues post DVT.  The patient has not been using compression therapy at this point.  No SOB or pleuritic chest pains.  No cough or hemoptysis.  No blood per rectum or blood in any sputum.  No excessive bruising per the patient.    No recent shortening of the patient's walking distance or new symptoms consistent with claudication.  No history of rest pain symptoms. No new ulcers or wounds of the lower extremities have occurred.  The patient denies amaurosis fugax or recent TIA symptoms. There are no recent neurological changes noted. No recent episodes of angina or shortness of breath documented.     Review of Systems  All other systems reviewed and are negative.      Objective:   Physical Exam Vitals reviewed.  HENT:     Head: Normocephalic.  Cardiovascular:     Rate and Rhythm: Normal rate.     Pulses: Normal pulses.  Pulmonary:     Effort: Pulmonary effort is normal.  Skin:    General: Skin is warm and dry.  Neurological:     Mental Status: She is alert and oriented to person, place, and time.  Psychiatric:        Mood and Affect: Mood normal.        Behavior: Behavior normal.        Thought Content: Thought content normal.        Judgment: Judgment normal.     BP 108/79 (BP Location: Right Arm)   Pulse 87   Resp 16   Ht _0  (1.651 m)   Wt 200 lb (90.7 kg)   LMP 01/22/2012 (Approximate) Comment: Hysterectomy 5 years  ago  BMI 33.28 kg/m   Past Medical History:  Diagnosis Date   Acute anterolateral wall MI ([place]) 08/2016   AICD (automatic cardioverter/defibrillator) present 03/29/2017   a.) Medtronic Evera MRI XT DR SureScan (SN: [id number])   Anxiety    Arthritis    Asthma 2013   C. 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   a.) Bx 02/22/2018 --> NE tumor cells --> AE1/AE3, chromogranin, synaptophysin (+); Ki-67 proliferation rate <1%; b.) repeat Bx 08/24/2018 --> well differentiard NET (G1)   Chronic, continuous use of opioids    a.) has naloxone Rx available   COPD (chronic obstructive pulmonary disease) ([place])    Depression    Diet-controlled type 2 diabetes mellitus ([place])    Diverticulitis 2010   DVT (deep venous thrombosis) ([place])    Endometriosis 1990   Generalized epilepsy ([place])    a.) on lacosamide; b.) last seizure 07-30-22 in setting of hypokalemia (K+ 2.6)   GERD (gastroesophageal reflux disease)    GIB (gastrointestinal bleeding) 01/08/2018   HFrEF (heart failure with reduced ejection fraction) ([place])    a.)TTE 12/09/16: EF 25-30%, dif HK; b.) TTE 02/08/17: EF 25%, dif HK, mild TR, G1DD;  c.) TTE 04/25/17: EF 30-35%, mild LVH; d.) TTE 01/30/18: EF 45%, MAC, LAE, mild MR, G1DD; e.) TTE 11/06/2018: EF 25%, sev glob HK, triv MR/TR/PR; f.) TTE 05/12/19: EF 35%, MAC, AoV sclerosis, G1DD; g.) TTE 11/05/19: EF 20%, sev glob HK, triv MR; h.) TTE 06/17/20: EF 40%, mod glob HK, triv MR; I:) TTE 06/14/22: EF 25-30%, G1DD   History of cardiac catheterization    a.) LHC 10/21/2014: normal coronaries; b.) R/LHC 02/24/2017: normal coronaries, mRA 12, mPA 28, mPCWP 15, CO 8.0, CI 3.8   History of kidney stones    Hx MRSA infection    Hypokalemia    Hypothyroidism    Iron deficiency anemia 03/27/2018   Lump or mass in breast 11/27/2012   a.) Bx 11/27/2012 --> apocrine wall cyst   Migraine    Nausea & vomiting    Neuropathy    Non-ischemic cardiomyopathy ([place])     a.) TTE 12/09/2016: EF 25-30%, b.) TTE 02/08/2017: EF 25%; c.) Medtronic AICD placed 03/29/2017; d.) TTE 04/25/2017: EF 30-35%; e.) TTE 01/30/2018: EF 45%; f.) TTE 11/06/2018: EF 25%; g.) TTE 05/12/2019: EF 35%; h.) TTE 11/05/2019: EF 20%; I.) TTE 06/17/2020: EF 40%; J.) TTE 06/14/2022: EF 25-30%   Obesity    OSA on CPAP    PAF (paroxysmal atrial fibrillation) (HCC)    PONV (postoperative nausea and vomiting)    Post-COVID chronic cough    Postcholecystectomy diarrhea    Prolonged Q-T interval on ECG    Restless leg    a.) on ropinirole   Unstable angina (HCC)     Social History   Socioeconomic History   Marital status: Married    Spouse name: Not on file   Number of children: Not on file   Years of education: Not on file   Highest education level: Not on file  Occupational History   Occupation: works at [employer].  Tobacco Use   Smoking status: Never   Smokeless tobacco: Never  Vaping Use   Vaping Use: Never used  Substance and Sexual Activity   Alcohol use: No   Drug use: Never   Sexual activity: Yes    Birth control/protection: Post-menopausal  Other Topics Concern   Not on file  Social History Narrative   Not on file   Social Determinants of Health   Financial Resource Strain: Not on file  Food Insecurity: No Food Insecurity (07/31/2022)   Hunger Vital Sign    Worried About Running Out of Food in the Last Year: Never true    Ran Out of Food in the Last Year: Never true  Transportation Needs: No Transportation Needs (07/31/2022)   PRAPARE - Hydrologist (Medical): No    Lack of Transportation (Non-Medical): No  Physical Activity: Not on file  Stress: Not on file  Social Connections: Not on file  Intimate Partner Violence: Not At Risk (07/31/2022)   Humiliation, Afraid, Rape, and Kick questionnaire    Fear of Current or Ex-Partner: No    Emotionally Abused: No    Physically Abused: No    Sexually Abused: No    Past Surgical  History:  Procedure Laterality Date   ABDOMINAL HYSTERECTOMY     age 75   BREAST BIOPSY Right 2014   benign   CARDIAC DEFIBRILLATOR PLACEMENT Left 03/29/2017   Procedure: [place] (AICD); Location: [facility]; Surgeon: Burnett Sheng, MD   [place]  COLONOSCOPY WITH PROPOFOL N/A 02/22/2018   Procedure: COLONOSCOPY WITH PROPOFOL;  Surgeon: Toledo, Benay Pike, MD;  Location: [facility];  Service: Gastroenterology;  Laterality: N/A;   ESOPHAGOGASTRODUODENOSCOPY (EGD) WITH PROPOFOL N/A 02/22/2018   Procedure: ESOPHAGOGASTRODUODENOSCOPY (EGD) WITH PROPOFOL;  Surgeon: Toledo, Benay Pike, MD;  Location: [facility] END[facility]COPY;  Service: Gastroenterology;  Laterality: N/A;   LEFT HEART CATH AND CORONARY ANGIOGRAPHY Left 10/21/2014   Procedure: LEFT HEART CATH AND CORONARY ANGIOGRAPHY; Location: [facility]; Surgeon: Serafina Royals, MD   NASAL SINUS SURGERY  2012   OOPHORECTOMY     RIGHT/LEFT HEART CATH AND CORONARY ANGIOGRAPHY Bilateral 02/24/2017   Procedure: RIGHT/LEFT HEART CATH AND CORONARY ANGIOGRAPHY; Location: [facility]   THYROIDECTOMY      Family History  Problem Relation Age of Onset   Cancer Mother 75       ovarian   CAD Mother    Cancer Father 38       brain   CAD Father    Cancer Daughter 14       skin   Cancer Maternal Aunt 34       breast   Leukemia Paternal Grandfather     Allergies  Allergen Reactions   Contrast Media [Iodinated Contrast Media] Shortness Of Breath    Per CT report in 2014 pt had break through contrast reaction with hives and SOB following 13 hour prep. MSY    Ferumoxytol Nausea Only   Iron Sucrose Anaphylaxis   Lidocaine Hives   Metrizamide Shortness Of Breath   Penicillins Hives and Other (See Comments)    ediate rash, facial/tongue/throat swelling, SOB or lightheadedness with hypotension, tachy   Sacubitril-Valsartan     Sz (unclear if new start entresto vs hypoglycemia)   Isosorbide  Nitrate Other (See Comments)    Headache   Latex Hives   Ondansetron Other (See Comments)    Severe headache   Tizanidine Other (See Comments)    Feels altered   Povidone-Iodine Rash    Blistering rash   Pulmicort [Budesonide] Itching       Latest Ref Rng & Units 08/01/2022    4:59 AM 07/31/2022    3:02 AM 07/30/2022    9:27 PM  CBC  WBC 4.0 - 10.5 K/uL 8.1  7.8  8.1   Hemoglobin 12.0 - 15.0 g/dL 11.5  10.7  11.7   Hematocrit 36.0 - 46.0 % 33.6  31.0  33.9   Platelets 150 - 400 K/uL 180  162  188       CMP     Component Value Date/Time   NA 142 08/01/2022 0459   NA 139 10/03/2014 1838   K 3.7 08/01/2022 0459   K 3.9 10/03/2014 1838   CL 107 08/01/2022 0459   CL 106 10/03/2014 1838   CO2 27 08/01/2022 0459   CO2 28 10/03/2014 1838   GLUCOSE 130 (H) 08/01/2022 0459   GLUCOSE 118 (H) 10/03/2014 1838   BUN 12 08/01/2022 0459   BUN 11 10/03/2014 1838   CREATININE 0.74 08/01/2022 0459   CREATININE 1.03 10/03/2014 1838   CALCIUM 7.7 (L) 08/01/2022 0459   CALCIUM 9.0 10/03/2014 1838   PROT 6.0 (L) 07/31/2022 0302   PROT 7.5 12/14/2012 2028   ALBUMIN 3.1 (L) 07/31/2022 0302   ALBUMIN 3.4 12/14/2012 2028   AST 26 07/31/2022 0302   AST 19 12/14/2012 2028   ALT 32 07/31/2022 0302   ALT 25 12/14/2012 2028   ALKPHOS 81 07/31/2022 0302   ALKPHOS 126 12/14/2012 2028  BILITOT 0.5 07/31/2022 0302   BILITOT 0.1 (L) 12/14/2012 2028   GFRNONAA >60 08/01/2022 0459   GFRNONAA >60 10/03/2014 1838   GFRNONAA 58 (L) 04/16/2014 0525   GFRAA >60 04/28/2020 1342   GFRAA >60 10/03/2014 1838   GFRAA >60 04/16/2014 0525     No results found.     Assessment & Plan:   1. Deep vein thrombosis (DVT) of lower extremity, unspecified chronicity, unspecified laterality, unspecified vein (HCC) Currently the patient is not on long-term anticoagulation and there have not been any problems or complications at this point.  IVC filter is strongly indicated prior to high risk orthopedic  surgery.  Especially given the history of PE / DVT.  IVC filter placement will be done the week for surgery. Risk and benefits were reviewed the patient.  Indications for the procedure were reviewed.  All questions were answered, the patient agrees to proceed.    The patient will follow-up with me 6-8 weeks months after the joint replacement surgery to discuss removal following DVT studies (this was also discussed today and the patient agrees with the plan to have the filter removed).   2. Essential hypertension Continue antihypertensive medications as already ordered, these medications have been reviewed and there are no changes at this time.  Current Outpatient Medications on File Prior to Visit  Medication Sig Dispense Refill   albuterol (ACCUNEB) 1.25 MG/3ML nebulizer solution Take 1 ampule by nebulization every 6 (six) hours as needed for wheezing.     albuterol (VENTOLIN HFA) 108 (90 Base) MCG/ACT inhaler Inhale 1-2 puffs into the lungs every 6 (six) hours as needed for wheezing or shortness of breath.     escitalopram (LEXAPRO) 10 MG tablet Take 10 mg by mouth every morning.     fentaNYL (DURAGESIC) 75 MCG/HR Place 1 patch onto the skin every 3 (three) days.     furosemide (LASIX) 40 MG tablet Take 40 mg by mouth 2 (two) times daily.     gabapentin (NEURONTIN) 100 MG capsule Take 100 mg by mouth at bedtime.     Lacosamide 100 MG TABS Take 1 tablet by mouth 2 (two) times daily.     levothyroxine (SYNTHROID) 175 MCG tablet Take 175 mcg by mouth every morning.     LORazepam (ATIVAN) 1 MG tablet Take 1 tablet (1 mg total) by mouth 4 (four) times daily as needed. (Patient taking differently: Take 1 mg by mouth 4 (four) times daily as needed (stomach cramps/nausea).) 10 tablet 0   losartan (COZAAR) 25 MG tablet Take 12.5 mg by mouth 2 (two) times daily.     metoprolol succinate (TOPROL-XL) 50 MG 24 hr tablet Take 50 mg by mouth 2 (two) times daily.     morphine (MSIR) 15 MG tablet Take 15 mg  by mouth every 4 (four) hours as needed.     naloxone (NARCAN) nasal spray 4 mg/0.1 mL Place 1 spray into the nose as needed for opioid reversal. May repeat once in opposite nare.     nitroGLYCERIN (NITROSTAT) 0.4 MG SL tablet Place 0.4 mg under the tongue every 5 (five) minutes x 3 doses as needed for chest pain.     oxyCODONE-acetaminophen (PERCOCET/ROXICET) 5-325 MG tablet Take 1-2 tablets by mouth every 4 (four) hours as needed (breakthrough pain).     Potassium Chloride ER 20 MEQ TBCR Take 1 tablet by mouth in the morning and at bedtime.     promethazine (PHENERGAN) 25 MG tablet Take 25 mg by mouth every  4 (four) hours as needed for nausea/vomiting.     rOPINIRole (REQUIP) 4 MG tablet Take 4 mg by mouth at bedtime.     No current facility-administered medications on file prior to visit.    There are no Patient Instructions on file for this visit. No follow-ups on file.   Kris Hartmann, NP

## 2022-08-23 ENCOUNTER — Encounter: Payer: Self-pay | Admitting: Vascular Surgery

## 2022-08-23 ENCOUNTER — Ambulatory Visit
Admission: RE | Admit: 2022-08-23 | Discharge: 2022-08-23 | Disposition: A | Discharge status: Home / Self Care | Payer: Medicare Other | Attending: Vascular Surgery | Admitting: Vascular Surgery

## 2022-08-23 ENCOUNTER — Other Ambulatory Visit: Payer: Self-pay

## 2022-08-23 ENCOUNTER — Encounter
Admission: RE | Disposition: A | Discharge status: Home / Self Care | Payer: Self-pay | Source: Home / Self Care | Attending: Vascular Surgery

## 2022-08-23 DIAGNOSIS — I10 Essential (primary) hypertension: Secondary | ICD-10-CM | POA: Insufficient documentation

## 2022-08-23 DIAGNOSIS — Z86718 Personal history of other venous thrombosis and embolism: Secondary | ICD-10-CM

## 2022-08-23 DIAGNOSIS — Z408 Encounter for other prophylactic surgery: Secondary | ICD-10-CM

## 2022-08-23 DIAGNOSIS — I82409 Acute embolism and thrombosis of unspecified deep veins of unspecified lower extremity: Secondary | ICD-10-CM

## 2022-08-23 HISTORY — PX: IVC FILTER INSERTION: CATH118245

## 2022-08-23 LAB — GLUCOSE, CAPILLARY: Glucose-Capillary: 107 mg/dL — ABNORMAL HIGH (ref 70–99)

## 2022-08-23 SURGERY — IVC FILTER INSERTION
Anesthesia: Moderate Sedation

## 2022-08-23 MED ORDER — GABAPENTIN 300 MG PO CAPS: 300.0000 mg | ORAL_CAPSULE | Freq: Once | ORAL | Status: AC

## 2022-08-23 MED ORDER — SODIUM CHLORIDE 0.9 % IV SOLN: INTRAVENOUS | Status: DC

## 2022-08-23 MED ORDER — MIDAZOLAM HCL 5 MG/5ML IJ SOLN
INTRAMUSCULAR | Status: AC
  Filled 2022-08-23: qty 5

## 2022-08-23 MED ORDER — HYDROMORPHONE HCL 1 MG/ML IJ SOLN: 1.0000 mg | Freq: Once | INTRAMUSCULAR | Status: DC | PRN

## 2022-08-23 MED ORDER — MIDAZOLAM HCL 2 MG/ML PO SYRP: 8.0000 mg | ORAL_SOLUTION | Freq: Once | ORAL | Status: DC | PRN

## 2022-08-23 MED ORDER — VANCOMYCIN HCL IN DEXTROSE 1-5 GM/200ML-% IV SOLN: 1000.0000 mg | INTRAVENOUS | Status: AC

## 2022-08-23 MED ORDER — MIDAZOLAM HCL 2 MG/2ML IJ SOLN
INTRAMUSCULAR | Status: DC | PRN
  Administered 2022-08-23: 2 mg via INTRAVENOUS
  Administered 2022-08-23: 1 mg

## 2022-08-23 MED ORDER — TRANEXAMIC ACID-NACL 1000-0.7 MG/100ML-% IV SOLN
1000.0000 mg | INTRAVENOUS | Status: AC
  Administered 2022-08-24: 1000 mg via INTRAVENOUS

## 2022-08-23 MED ORDER — METHYLPREDNISOLONE SODIUM SUCC 125 MG IJ SOLR
125.0000 mg | Freq: Once | INTRAMUSCULAR | Status: AC | PRN
  Administered 2022-08-23: 125 mg via INTRAVENOUS

## 2022-08-23 MED ORDER — FENTANYL CITRATE (PF) 100 MCG/2ML IJ SOLN
INTRAMUSCULAR | Status: AC
  Filled 2022-08-23: qty 2

## 2022-08-23 MED ORDER — FAMOTIDINE 20 MG PO TABS
40.0000 mg | ORAL_TABLET | Freq: Once | ORAL | Status: AC | PRN
  Administered 2022-08-23: 40 mg via ORAL

## 2022-08-23 MED ORDER — CHLORHEXIDINE GLUCONATE 4 % EX LIQD: 60.0000 mL | Freq: Once | CUTANEOUS | Status: DC

## 2022-08-23 MED ORDER — DIPHENHYDRAMINE HCL 50 MG/ML IJ SOLN
INTRAMUSCULAR | Status: AC
  Administered 2022-08-23: 50 mg via INTRAVENOUS
  Filled 2022-08-23: qty 1

## 2022-08-23 MED ORDER — FAMOTIDINE 20 MG PO TABS: 20.0000 mg | ORAL_TABLET | Freq: Once | ORAL | Status: AC

## 2022-08-23 MED ORDER — METHYLPREDNISOLONE SODIUM SUCC 125 MG IJ SOLR: 125.0000 mg | Freq: Once | INTRAMUSCULAR | Status: AC

## 2022-08-23 MED ORDER — FAMOTIDINE 20 MG PO TABS
ORAL_TABLET | ORAL | Status: AC
  Filled 2022-08-23: qty 2

## 2022-08-23 MED ORDER — CHLORHEXIDINE GLUCONATE 0.12 % MT SOLN: 15.0000 mL | Freq: Once | OROMUCOSAL | Status: AC

## 2022-08-23 MED ORDER — DEXAMETHASONE SODIUM PHOSPHATE 10 MG/ML IJ SOLN: 8.0000 mg | Freq: Once | INTRAMUSCULAR | Status: AC

## 2022-08-23 MED ORDER — VANCOMYCIN HCL IN DEXTROSE 1-5 GM/200ML-% IV SOLN
INTRAVENOUS | Status: AC
  Administered 2022-08-23: 1000 mg via INTRAVENOUS
  Filled 2022-08-23: qty 200

## 2022-08-23 MED ORDER — CEFAZOLIN SODIUM-DEXTROSE 2-4 GM/100ML-% IV SOLN
2.0000 g | INTRAVENOUS | Status: AC
  Administered 2022-08-24: 2 g via INTRAVENOUS

## 2022-08-23 MED ORDER — METHYLPREDNISOLONE SODIUM SUCC 125 MG IJ SOLR
INTRAMUSCULAR | Status: AC
  Administered 2022-08-23: 125 mg via INTRAVENOUS
  Filled 2022-08-23: qty 2

## 2022-08-23 MED ORDER — METHYLPREDNISOLONE SODIUM SUCC 125 MG IJ SOLR
INTRAMUSCULAR | Status: AC
  Filled 2022-08-23: qty 2

## 2022-08-23 MED ORDER — DIPHENHYDRAMINE HCL 50 MG/ML IJ SOLN: 50.0000 mg | Freq: Once | INTRAMUSCULAR | Status: AC | PRN

## 2022-08-23 MED ORDER — IODIXANOL 320 MG/ML IV SOLN
INTRAVENOUS | Status: DC | PRN
  Administered 2022-08-23: 15 mL via INTRAVENOUS

## 2022-08-23 MED ORDER — CELECOXIB 200 MG PO CAPS: 400.0000 mg | ORAL_CAPSULE | Freq: Once | ORAL | Status: AC

## 2022-08-23 MED ORDER — FENTANYL CITRATE (PF) 100 MCG/2ML IJ SOLN
INTRAMUSCULAR | Status: DC | PRN
  Administered 2022-08-23: 25 ug
  Administered 2022-08-23: 50 ug via INTRAVENOUS

## 2022-08-23 SURGICAL SUPPLY — 7 items
COVER PROBE ULTRASOUND 5X96 (MISCELLANEOUS) IMPLANT
KIT FEM OPTION ELITE FILTER (Filter) IMPLANT
NDL ENTRY 21GA 7CM ECHOTIP (NEEDLE) IMPLANT
NEEDLE ENTRY 21GA 7CM ECHOTIP (NEEDLE) ×1 IMPLANT
PACK ANGIOGRAPHY (CUSTOM PROCEDURE TRAY) ×1 IMPLANT
SET INTRO CAPELLA COAXIAL (SET/KITS/TRAYS/PACK) IMPLANT
WIRE SUPRACORE 190CM (MISCELLANEOUS) IMPLANT

## 2022-08-23 NOTE — Op Note (Signed)
[facility]   OPERATIVE NOTE    PRE-OPERATIVE DIAGNOSIS: History of DVT; upcoming total knee replacement (high risk surgery).  POST-OPERATIVE DIAGNOSIS: Same  PROCEDURE: 1.   Ultrasound guidance for vascular access to the right common femoral vein 2.   Catheter placement into the inferior vena cava 3.   Inferior venacavogram 4.   Placement of a option Elite IVC filter  SURGEON: Hortencia Pilar  ASSISTANT(S): None  ANESTHESIA: Conscious sedation was administered by the interventional radiology RN under my direct supervision. IV Versed plus fentanyl were utilized. Continuous ECG, pulse oximetry and blood pressure was monitored throughout the entire procedure. Conscious sedation was for a total of 19 minutes.  ESTIMATED BLOOD LOSS: minimal  FINDING(S): 1.  Patent IVC  SPECIMEN(S):  none  INDICATIONS:   Brittany Nguyen is a 56 y.o. y.o. female who presents with upcoming knee replacement surgery and therefore given her history of DVT needs IVC filter to prevent lethal pulmonary embolism.  Inferior vena cava filter is indicated for this reason.  Risks and benefits including filter thrombosis, migration, fracture, bleeding, and infection were all discussed.  We discussed that all IVC filters that we place can be removed if desired from the patient once the need for the filter has passed.    DESCRIPTION: After obtaining full informed written consent, the patient was brought back to the vascular suite. The skin was sterilely prepped and draped in a sterile surgical field was created. Ultrasound was placed in a sterile sleeve. The right common femoral vein was echolucent and compressible indicating patency. Image was recorded for the permanent record. The puncture was made under continuous real-time ultrasound guidance.  The right common femoral vein was accessed under direct ultrasound guidance without difficulty with a micropuncture needle. Microwire was then  advanced under fluoroscopic guidance without difficulty. Micro-sheath was then inserted and a J-wire was then placed. The dilator is passed over the wire and the delivery sheath was placed into the inferior vena cava.  Inferior venacavogram was performed. This demonstrated a patent IVC with the level of the renal veins at L1.  The filter was then deployed into the inferior vena cava at the level of L2 just below the renal veins. The delivery sheath was then removed. Pressure was held. Sterile dressings were placed. The patient tolerated the procedure well and was taken to the recovery room in stable condition.  Interpretation: Inferior vena cava is opacified with a bolus injection of contrast.  There are no filling defects cava is widely patent and normal measures 24 mm in diameter.  Renal blushes are noted at L1.  Option Elite filter is deployed in the upright orientation at the level of L2.  COMPLICATIONS: None  CONDITION: Stable  Hortencia Pilar  08/23/2022, 3:51 PM

## 2022-08-23 NOTE — Interval H&P Note (Signed)
History and Physical Interval Note:  08/23/2022 2:53 PM  Evon Slack Siedschlag  has presented today for surgery, with the diagnosis of IVC Filter Insertion    DVT.  The various methods of treatment have been discussed with the patient and family. After consideration of risks, benefits and other options for treatment, the patient has consented to  Procedure(s): IVC FILTER INSERTION (N/A) as a surgical intervention.  The patient's history has been reviewed, patient examined, no change in status, stable for surgery.  I have reviewed the patient's chart and labs.  Questions were answered to the patient's satisfaction.     Hortencia Pilar

## 2022-08-23 NOTE — Progress Notes (Addendum)
Pt appears with red skin and c/o itching at neck and chin and back, Dr Delana Meyer notified and verbal order received for 125 solumedrol, confirmed med with Larkin Ina, pharm, for limits and secondary treatment

## 2022-08-24 ENCOUNTER — Ambulatory Visit: Payer: Medicare Other | Admitting: Urgent Care

## 2022-08-24 ENCOUNTER — Other Ambulatory Visit: Payer: Self-pay

## 2022-08-24 ENCOUNTER — Encounter
Admission: RE | Disposition: A | Discharge status: Home / Self Care | Payer: Self-pay | Source: Ambulatory Visit | Attending: Orthopedic Surgery

## 2022-08-24 ENCOUNTER — Observation Stay
Admission: RE | Admit: 2022-08-24 | Discharge: 2022-08-25 | Disposition: A | Discharge status: Home / Self Care | Payer: Medicare Other | Source: Ambulatory Visit | Attending: Orthopedic Surgery | Admitting: Orthopedic Surgery

## 2022-08-24 ENCOUNTER — Observation Stay: Payer: Medicare Other

## 2022-08-24 ENCOUNTER — Encounter: Payer: Self-pay | Admitting: Orthopedic Surgery

## 2022-08-24 DIAGNOSIS — E119 Type 2 diabetes mellitus without complications: Secondary | ICD-10-CM | POA: Insufficient documentation

## 2022-08-24 DIAGNOSIS — I200 Unstable angina: Secondary | ICD-10-CM

## 2022-08-24 DIAGNOSIS — Z86718 Personal history of other venous thrombosis and embolism: Secondary | ICD-10-CM | POA: Insufficient documentation

## 2022-08-24 DIAGNOSIS — M1712 Unilateral primary osteoarthritis, left knee: Principal | ICD-10-CM | POA: Insufficient documentation

## 2022-08-24 DIAGNOSIS — J45909 Unspecified asthma, uncomplicated: Secondary | ICD-10-CM | POA: Diagnosis not present

## 2022-08-24 DIAGNOSIS — Z96652 Presence of left artificial knee joint: Secondary | ICD-10-CM

## 2022-08-24 DIAGNOSIS — I480 Paroxysmal atrial fibrillation: Secondary | ICD-10-CM | POA: Insufficient documentation

## 2022-08-24 DIAGNOSIS — E876 Hypokalemia: Secondary | ICD-10-CM

## 2022-08-24 DIAGNOSIS — Z79899 Other long term (current) drug therapy: Secondary | ICD-10-CM | POA: Insufficient documentation

## 2022-08-24 DIAGNOSIS — J449 Chronic obstructive pulmonary disease, unspecified: Secondary | ICD-10-CM | POA: Insufficient documentation

## 2022-08-24 DIAGNOSIS — E039 Hypothyroidism, unspecified: Secondary | ICD-10-CM | POA: Diagnosis not present

## 2022-08-24 DIAGNOSIS — I5020 Unspecified systolic (congestive) heart failure: Secondary | ICD-10-CM | POA: Insufficient documentation

## 2022-08-24 DIAGNOSIS — Z950 Presence of cardiac pacemaker: Secondary | ICD-10-CM | POA: Diagnosis not present

## 2022-08-24 DIAGNOSIS — Z01818 Encounter for other preprocedural examination: Secondary | ICD-10-CM

## 2022-08-24 DIAGNOSIS — Z853 Personal history of malignant neoplasm of breast: Secondary | ICD-10-CM | POA: Diagnosis not present

## 2022-08-24 DIAGNOSIS — Z8616 Personal history of COVID-19: Secondary | ICD-10-CM | POA: Diagnosis not present

## 2022-08-24 DIAGNOSIS — Z96659 Presence of unspecified artificial knee joint: Secondary | ICD-10-CM

## 2022-08-24 DIAGNOSIS — I2510 Atherosclerotic heart disease of native coronary artery without angina pectoris: Secondary | ICD-10-CM | POA: Diagnosis not present

## 2022-08-24 DIAGNOSIS — Z880 Allergy status to penicillin: Secondary | ICD-10-CM

## 2022-08-24 DIAGNOSIS — I110 Hypertensive heart disease with heart failure: Secondary | ICD-10-CM | POA: Insufficient documentation

## 2022-08-24 DIAGNOSIS — D509 Iron deficiency anemia, unspecified: Secondary | ICD-10-CM

## 2022-08-24 HISTORY — DX: Other specified postprocedural states: Z98.890

## 2022-08-24 HISTORY — DX: Opioid use, unspecified, uncomplicated: F11.90

## 2022-08-24 HISTORY — DX: Unspecified systolic (congestive) heart failure: I50.20

## 2022-08-24 HISTORY — PX: KNEE ARTHROPLASTY: SHX992

## 2022-08-24 HISTORY — DX: Other postprocedural complications and disorders of digestive system: K91.89

## 2022-08-24 HISTORY — DX: Anxiety disorder, unspecified: F41.9

## 2022-08-24 HISTORY — DX: Depression, unspecified: F32.A

## 2022-08-24 HISTORY — DX: Generalized idiopathic epilepsy and epileptic syndromes, not intractable, without status epilepticus: G40.309

## 2022-08-24 HISTORY — DX: Paroxysmal atrial fibrillation: I48.0

## 2022-08-24 HISTORY — DX: Diarrhea, unspecified: R19.7

## 2022-08-24 HISTORY — DX: Type 2 diabetes mellitus without complications: E11.9

## 2022-08-24 LAB — POCT I-STAT, CHEM 8
BUN: 14 mg/dL (ref 6–20)
Calcium, Ion: 1.03 mmol/L — ABNORMAL LOW (ref 1.15–1.40)
Chloride: 100 mmol/L (ref 98–111)
Creatinine, Ser: 0.9 mg/dL (ref 0.44–1.00)
Glucose, Bld: 184 mg/dL — ABNORMAL HIGH (ref 70–99)
HCT: 43 % (ref 36.0–46.0)
Hemoglobin: 14.6 g/dL (ref 12.0–15.0)
Potassium: 3.5 mmol/L (ref 3.5–5.1)
Sodium: 139 mmol/L (ref 135–145)
TCO2: 25 mmol/L (ref 22–32)

## 2022-08-24 LAB — GLUCOSE, CAPILLARY
Glucose-Capillary: 182 mg/dL — ABNORMAL HIGH (ref 70–99)
Glucose-Capillary: 197 mg/dL — ABNORMAL HIGH (ref 70–99)

## 2022-08-24 SURGERY — ARTHROPLASTY, KNEE, TOTAL, USING IMAGELESS COMPUTER-ASSISTED NAVIGATION
Anesthesia: Spinal | Site: Knee | Laterality: Left

## 2022-08-24 MED ORDER — SENNOSIDES-DOCUSATE SODIUM 8.6-50 MG PO TABS: 1.0000 | ORAL_TABLET | Freq: Two times a day (BID) | ORAL | Status: DC

## 2022-08-24 MED ORDER — OXYCODONE HCL 5 MG PO TABS
10.0000 mg | ORAL_TABLET | ORAL | Status: DC | PRN
  Administered 2022-08-24 – 2022-08-25 (×2): 10 mg via ORAL

## 2022-08-24 MED ORDER — ALUM & MAG HYDROXIDE-SIMETH 200-200-20 MG/5ML PO SUSP: 30.0000 mL | ORAL | Status: DC | PRN

## 2022-08-24 MED ORDER — TRANEXAMIC ACID-NACL 1000-0.7 MG/100ML-% IV SOLN: 1000.0000 mg | Freq: Once | INTRAVENOUS | Status: AC

## 2022-08-24 MED ORDER — PHENOL 1.4 % MT LIQD: 1.0000 | OROMUCOSAL | Status: DC | PRN

## 2022-08-24 MED ORDER — PROMETHAZINE HCL 25 MG/ML IJ SOLN
INTRAMUSCULAR | Status: AC
  Administered 2022-08-24: 6.25 mg via INTRAVENOUS
  Filled 2022-08-24: qty 1

## 2022-08-24 MED ORDER — MORPHINE SULFATE 15 MG PO TABS
15.0000 mg | ORAL_TABLET | ORAL | Status: DC | PRN
  Administered 2022-08-24 – 2022-08-25 (×2): 15 mg via ORAL
  Filled 2022-08-24 (×4): qty 1

## 2022-08-24 MED ORDER — ACETAMINOPHEN 325 MG PO TABS
325.0000 mg | ORAL_TABLET | Freq: Four times a day (QID) | ORAL | Status: DC | PRN
  Administered 2022-08-25: 650 mg via ORAL

## 2022-08-24 MED ORDER — FUROSEMIDE 20 MG PO TABS: 40.0000 mg | ORAL_TABLET | Freq: Two times a day (BID) | ORAL | Status: DC

## 2022-08-24 MED ORDER — FUROSEMIDE 20 MG PO TABS
ORAL_TABLET | ORAL | Status: AC
  Administered 2022-08-24: 40 mg via ORAL
  Filled 2022-08-24: qty 2

## 2022-08-24 MED ORDER — SODIUM CHLORIDE 0.9 % NICU IV INFUSION SIMPLE
500.0000 mL | INJECTION | Freq: Once | INTRAVENOUS | Status: AC
  Administered 2022-08-24: 500 mL via INTRAVENOUS
  Filled 2022-08-24: qty 500

## 2022-08-24 MED ORDER — LOSARTAN POTASSIUM 25 MG PO TABS
12.5000 mg | ORAL_TABLET | Freq: Two times a day (BID) | ORAL | Status: DC
  Administered 2022-08-25: 12.5 mg via ORAL
  Filled 2022-08-24 (×3): qty 0.5

## 2022-08-24 MED ORDER — KETAMINE HCL 50 MG/5ML IJ SOSY
PREFILLED_SYRINGE | INTRAMUSCULAR | Status: AC
  Filled 2022-08-24: qty 5

## 2022-08-24 MED ORDER — PROMETHAZINE HCL 25 MG/ML IJ SOLN: 6.2500 mg | Freq: Once | INTRAMUSCULAR | Status: AC

## 2022-08-24 MED ORDER — NALOXONE HCL 4 MG/0.1ML NA LIQD: 1.0000 | NASAL | Status: DC | PRN

## 2022-08-24 MED ORDER — CELECOXIB 200 MG PO CAPS
ORAL_CAPSULE | ORAL | Status: AC
  Administered 2022-08-24: 200 mg via ORAL
  Filled 2022-08-24: qty 1

## 2022-08-24 MED ORDER — SODIUM CHLORIDE 0.9 % IR SOLN
Status: DC | PRN
  Administered 2022-08-24: 3000 mL

## 2022-08-24 MED ORDER — ONDANSETRON HCL 4 MG PO TABS: 4.0000 mg | ORAL_TABLET | Freq: Four times a day (QID) | ORAL | Status: DC | PRN

## 2022-08-24 MED ORDER — METOCLOPRAMIDE HCL 10 MG PO TABS
ORAL_TABLET | ORAL | Status: AC
  Administered 2022-08-24: 10 mg via ORAL
  Filled 2022-08-24: qty 1

## 2022-08-24 MED ORDER — DIPHENHYDRAMINE HCL 12.5 MG/5ML PO ELIX: 12.5000 mg | ORAL_SOLUTION | ORAL | Status: DC | PRN

## 2022-08-24 MED ORDER — POTASSIUM CHLORIDE CRYS ER 20 MEQ PO TBCR: 20.0000 meq | EXTENDED_RELEASE_TABLET | Freq: Two times a day (BID) | ORAL | Status: DC

## 2022-08-24 MED ORDER — CHLORHEXIDINE GLUCONATE 0.12 % MT SOLN
OROMUCOSAL | Status: AC
  Administered 2022-08-24: 15 mL via OROMUCOSAL
  Filled 2022-08-24: qty 15

## 2022-08-24 MED ORDER — BUPIVACAINE HCL (PF) 0.5 % IJ SOLN
INTRAMUSCULAR | Status: DC | PRN
  Administered 2022-08-24: 3 mL

## 2022-08-24 MED ORDER — GABAPENTIN 100 MG PO CAPS: 100.0000 mg | ORAL_CAPSULE | Freq: Every day | ORAL | Status: DC

## 2022-08-24 MED ORDER — FENTANYL CITRATE (PF) 100 MCG/2ML IJ SOLN: 25.0000 ug | INTRAMUSCULAR | Status: DC | PRN

## 2022-08-24 MED ORDER — PHENYLEPHRINE HCL-NACL 20-0.9 MG/250ML-% IV SOLN
INTRAVENOUS | Status: DC | PRN
  Administered 2022-08-24: 70 ug/min via INTRAVENOUS

## 2022-08-24 MED ORDER — ACETAMINOPHEN 10 MG/ML IV SOLN: 1000.0000 mg | Freq: Four times a day (QID) | INTRAVENOUS | Status: DC

## 2022-08-24 MED ORDER — LEVOTHYROXINE SODIUM 175 MCG PO TABS
175.0000 ug | ORAL_TABLET | Freq: Every day | ORAL | Status: DC
  Administered 2022-08-25: 175 ug via ORAL
  Filled 2022-08-24 (×3): qty 1

## 2022-08-24 MED ORDER — ROPINIROLE HCL 1 MG PO TABS
4.0000 mg | ORAL_TABLET | Freq: Every day | ORAL | Status: DC
  Administered 2022-08-25: 4 mg via ORAL
  Filled 2022-08-24: qty 4

## 2022-08-24 MED ORDER — SODIUM CHLORIDE (PF) 0.9 % IJ SOLN
INTRAMUSCULAR | Status: AC
  Filled 2022-08-24: qty 10

## 2022-08-24 MED ORDER — MAGNESIUM HYDROXIDE 400 MG/5ML PO SUSP: 30.0000 mL | Freq: Every day | ORAL | Status: DC

## 2022-08-24 MED ORDER — OXYCODONE HCL 5 MG PO TABS
ORAL_TABLET | ORAL | Status: AC
  Filled 2022-08-24: qty 2

## 2022-08-24 MED ORDER — MIDAZOLAM HCL 5 MG/5ML IJ SOLN
INTRAMUSCULAR | Status: DC | PRN
  Administered 2022-08-24 (×2): 2 mg via INTRAVENOUS

## 2022-08-24 MED ORDER — MIDAZOLAM HCL 2 MG/2ML IJ SOLN
INTRAMUSCULAR | Status: AC
  Filled 2022-08-24: qty 2

## 2022-08-24 MED ORDER — PROPOFOL 10 MG/ML IV BOLUS
INTRAVENOUS | Status: DC | PRN
  Administered 2022-08-24: 50 mg via INTRAVENOUS

## 2022-08-24 MED ORDER — SODIUM CHLORIDE (PF) 0.9 % IJ SOLN
INTRAMUSCULAR | Status: DC | PRN
  Administered 2022-08-24: 120 mL via INTRAMUSCULAR

## 2022-08-24 MED ORDER — CEFAZOLIN SODIUM-DEXTROSE 2-4 GM/100ML-% IV SOLN: 2.0000 g | Freq: Four times a day (QID) | INTRAVENOUS | Status: AC

## 2022-08-24 MED ORDER — CEFAZOLIN SODIUM-DEXTROSE 2-4 GM/100ML-% IV SOLN
INTRAVENOUS | Status: AC
  Administered 2022-08-24: 2 g via INTRAVENOUS
  Filled 2022-08-24: qty 100

## 2022-08-24 MED ORDER — OXYCODONE HCL 5 MG PO TABS: 5.0000 mg | ORAL_TABLET | ORAL | Status: DC | PRN

## 2022-08-24 MED ORDER — BUPIVACAINE HCL (PF) 0.25 % IJ SOLN
INTRAMUSCULAR | Status: AC
  Filled 2022-08-24: qty 60

## 2022-08-24 MED ORDER — PHENYLEPHRINE HCL (PRESSORS) 10 MG/ML IV SOLN
INTRAVENOUS | Status: AC
  Filled 2022-08-24: qty 1

## 2022-08-24 MED ORDER — GABAPENTIN 300 MG PO CAPS
ORAL_CAPSULE | ORAL | Status: AC
  Administered 2022-08-24: 300 mg via ORAL
  Filled 2022-08-24: qty 1

## 2022-08-24 MED ORDER — FENTANYL 75 MCG/HR TD PT72: 1.0000 | MEDICATED_PATCH | TRANSDERMAL | Status: DC

## 2022-08-24 MED ORDER — ORAL CARE MOUTH RINSE: 15.0000 mL | OROMUCOSAL | Status: DC | PRN

## 2022-08-24 MED ORDER — ACETAMINOPHEN 10 MG/ML IV SOLN
INTRAVENOUS | Status: AC
  Administered 2022-08-24: 1000 mg via INTRAVENOUS
  Filled 2022-08-24: qty 100

## 2022-08-24 MED ORDER — IRRISEPT - 450ML BOTTLE WITH 0.05% CHG IN STERILE WATER, USP 99.95% OPTIME
TOPICAL | Status: DC | PRN
  Administered 2022-08-24: 450 mL

## 2022-08-24 MED ORDER — CELECOXIB 200 MG PO CAPS
ORAL_CAPSULE | ORAL | Status: AC
  Administered 2022-08-24: 400 mg via ORAL
  Filled 2022-08-24: qty 2

## 2022-08-24 MED ORDER — ACETAMINOPHEN 10 MG/ML IV SOLN
INTRAVENOUS | Status: AC
  Administered 2022-08-24: 1000 mg
  Filled 2022-08-24: qty 100

## 2022-08-24 MED ORDER — MENTHOL 3 MG MT LOZG: 1.0000 | LOZENGE | OROMUCOSAL | Status: DC | PRN

## 2022-08-24 MED ORDER — SODIUM CHLORIDE 0.9 % IV SOLN: INTRAVENOUS | Status: DC

## 2022-08-24 MED ORDER — ONDANSETRON HCL 4 MG/2ML IJ SOLN: 4.0000 mg | Freq: Four times a day (QID) | INTRAMUSCULAR | Status: DC | PRN

## 2022-08-24 MED ORDER — FLEET ENEMA 7-19 GM/118ML RE ENEM: 1.0000 | ENEMA | Freq: Once | RECTAL | Status: DC | PRN

## 2022-08-24 MED ORDER — TRANEXAMIC ACID-NACL 1000-0.7 MG/100ML-% IV SOLN
INTRAVENOUS | Status: AC
  Filled 2022-08-24: qty 100

## 2022-08-24 MED ORDER — GABAPENTIN 100 MG PO CAPS
ORAL_CAPSULE | ORAL | Status: AC
  Administered 2022-08-24: 100 mg via ORAL
  Filled 2022-08-24: qty 1

## 2022-08-24 MED ORDER — OXYCODONE HCL 5 MG PO TABS
ORAL_TABLET | ORAL | Status: AC
  Administered 2022-08-24: 5 mg via ORAL
  Filled 2022-08-24: qty 1

## 2022-08-24 MED ORDER — HYDROMORPHONE HCL 1 MG/ML IJ SOLN
INTRAMUSCULAR | Status: AC
  Filled 2022-08-24: qty 1

## 2022-08-24 MED ORDER — BUPIVACAINE LIPOSOME 1.3 % IJ SUSP
INTRAMUSCULAR | Status: AC
  Filled 2022-08-24: qty 40

## 2022-08-24 MED ORDER — METOCLOPRAMIDE HCL 10 MG PO TABS: 10.0000 mg | ORAL_TABLET | Freq: Three times a day (TID) | ORAL | Status: DC

## 2022-08-24 MED ORDER — TRANEXAMIC ACID-NACL 1000-0.7 MG/100ML-% IV SOLN
INTRAVENOUS | Status: AC
  Administered 2022-08-24: 1000 mg via INTRAVENOUS
  Filled 2022-08-24: qty 100

## 2022-08-24 MED ORDER — METOPROLOL SUCCINATE ER 25 MG PO TB24
50.0000 mg | ORAL_TABLET | Freq: Two times a day (BID) | ORAL | Status: DC
  Administered 2022-08-25: 50 mg via ORAL
  Filled 2022-08-24 (×3): qty 2

## 2022-08-24 MED ORDER — PHENYLEPHRINE 80 MCG/ML (10ML) SYRINGE FOR IV PUSH (FOR BLOOD PRESSURE SUPPORT)
PREFILLED_SYRINGE | INTRAVENOUS | Status: DC | PRN
  Administered 2022-08-24: 160 ug via INTRAVENOUS
  Administered 2022-08-24: 80 ug via INTRAVENOUS

## 2022-08-24 MED ORDER — DEXAMETHASONE SODIUM PHOSPHATE 10 MG/ML IJ SOLN
INTRAMUSCULAR | Status: AC
  Administered 2022-08-24: 8 mg via INTRAVENOUS
  Filled 2022-08-24: qty 1

## 2022-08-24 MED ORDER — KETAMINE HCL 10 MG/ML IJ SOLN
INTRAMUSCULAR | Status: DC | PRN
  Administered 2022-08-24: 30 mg via INTRAVENOUS

## 2022-08-24 MED ORDER — NITROGLYCERIN 0.4 MG SL SUBL: 0.4000 mg | SUBLINGUAL_TABLET | SUBLINGUAL | Status: DC | PRN

## 2022-08-24 MED ORDER — FAMOTIDINE 20 MG PO TABS
ORAL_TABLET | ORAL | Status: AC
  Administered 2022-08-24: 20 mg via ORAL
  Filled 2022-08-24: qty 1

## 2022-08-24 MED ORDER — MAGNESIUM HYDROXIDE 400 MG/5ML PO SUSP
ORAL | Status: AC
  Administered 2022-08-24: 30 mL via ORAL
  Filled 2022-08-24: qty 30

## 2022-08-24 MED ORDER — PANTOPRAZOLE SODIUM 40 MG PO TBEC: 40.0000 mg | DELAYED_RELEASE_TABLET | Freq: Two times a day (BID) | ORAL | Status: DC

## 2022-08-24 MED ORDER — PROPOFOL 500 MG/50ML IV EMUL
INTRAVENOUS | Status: DC | PRN
  Administered 2022-08-24: 200 ug/kg/min via INTRAVENOUS

## 2022-08-24 MED ORDER — ESCITALOPRAM OXALATE 10 MG PO TABS
10.0000 mg | ORAL_TABLET | ORAL | Status: DC
  Administered 2022-08-25: 10 mg via ORAL
  Filled 2022-08-24 (×2): qty 1

## 2022-08-24 MED ORDER — SENNOSIDES-DOCUSATE SODIUM 8.6-50 MG PO TABS
ORAL_TABLET | ORAL | Status: AC
  Administered 2022-08-24: 1 via ORAL
  Filled 2022-08-24: qty 1

## 2022-08-24 MED ORDER — CELECOXIB 200 MG PO CAPS: 200.0000 mg | ORAL_CAPSULE | Freq: Two times a day (BID) | ORAL | Status: DC

## 2022-08-24 MED ORDER — CEFAZOLIN SODIUM-DEXTROSE 2-4 GM/100ML-% IV SOLN
INTRAVENOUS | Status: AC
  Filled 2022-08-24: qty 100

## 2022-08-24 MED ORDER — BUPIVACAINE IN DEXTROSE 0.75-8.25 % IT SOLN: INTRATHECAL | Status: DC | PRN

## 2022-08-24 MED ORDER — POTASSIUM CHLORIDE CRYS ER 20 MEQ PO TBCR
EXTENDED_RELEASE_TABLET | ORAL | Status: AC
  Administered 2022-08-24: 20 meq via ORAL
  Filled 2022-08-24: qty 1

## 2022-08-24 MED ORDER — BISACODYL 10 MG RE SUPP: 10.0000 mg | Freq: Every day | RECTAL | Status: DC | PRN

## 2022-08-24 MED ORDER — ENOXAPARIN SODIUM 30 MG/0.3ML IJ SOSY: 30.0000 mg | PREFILLED_SYRINGE | Freq: Two times a day (BID) | INTRAMUSCULAR | Status: DC

## 2022-08-24 MED ORDER — LACOSAMIDE 50 MG PO TABS
100.0000 mg | ORAL_TABLET | Freq: Two times a day (BID) | ORAL | Status: DC
  Administered 2022-08-24: 100 mg via ORAL
  Filled 2022-08-24 (×3): qty 2

## 2022-08-24 MED ORDER — HYDROMORPHONE HCL 1 MG/ML IJ SOLN
0.5000 mg | INTRAMUSCULAR | Status: DC | PRN
  Administered 2022-08-24: 0.5 mg via INTRAVENOUS

## 2022-08-24 MED ORDER — PROPOFOL 1000 MG/100ML IV EMUL
INTRAVENOUS | Status: AC
  Filled 2022-08-24: qty 100

## 2022-08-24 MED ORDER — EPHEDRINE SULFATE (PRESSORS) 50 MG/ML IJ SOLN
INTRAMUSCULAR | Status: DC | PRN
  Administered 2022-08-24: 5 mg via INTRAVENOUS

## 2022-08-24 MED ORDER — PANTOPRAZOLE SODIUM 40 MG PO TBEC
DELAYED_RELEASE_TABLET | ORAL | Status: AC
  Administered 2022-08-24: 40 mg via ORAL
  Filled 2022-08-24: qty 1

## 2022-08-24 MED ORDER — LORAZEPAM 1 MG PO TABS: 1.0000 mg | ORAL_TABLET | Freq: Four times a day (QID) | ORAL | Status: DC | PRN

## 2022-08-24 MED ORDER — OXYCODONE HCL 5 MG PO TABS
ORAL_TABLET | ORAL | Status: AC
  Administered 2022-08-24: 10 mg via ORAL
  Filled 2022-08-24: qty 2

## 2022-08-24 MED ORDER — PROMETHAZINE HCL 25 MG PO TABS: 25.0000 mg | ORAL_TABLET | ORAL | Status: DC | PRN

## 2022-08-24 SURGICAL SUPPLY — 75 items
ATTUNE PSFEM LTSZ4 NARCEM KNEE (Femur) IMPLANT
ATTUNE PSRP INSR SZ4 5 KNEE (Insert) IMPLANT
BASEPLATE TIBIAL ROTATING SZ 4 (Knees) IMPLANT
BATTERY INSTRU NAVIGATION (MISCELLANEOUS) ×4 IMPLANT
BLADE CLIPPER SURG (BLADE) IMPLANT
BLADE SAW 70X12.5 (BLADE) ×1 IMPLANT
BLADE SAW 90X13X1.19 OSCILLAT (BLADE) ×1 IMPLANT
BLADE SAW 90X25X1.19 OSCILLAT (BLADE) ×1 IMPLANT
BONE CEMENT GENTAMICIN (Cement) ×2 IMPLANT
CEMENT BONE GENTAMICIN 40 (Cement) IMPLANT
COOLER POLAR GLACIER W/PUMP (MISCELLANEOUS) ×1 IMPLANT
CUFF TOURN SGL QUICK 24 (TOURNIQUET CUFF)
CUFF TOURN SGL QUICK 34 (TOURNIQUET CUFF)
CUFF TRNQT CYL 24X4X16.5-23 (TOURNIQUET CUFF) IMPLANT
CUFF TRNQT CYL 34X4.125X (TOURNIQUET CUFF) IMPLANT
DRAPE 3/4 80X56 (DRAPES) ×1 IMPLANT
DRAPE INCISE IOBAN 66X45 STRL (DRAPES) IMPLANT
DRSG DERMACEA NONADH 3X8 (GAUZE/BANDAGES/DRESSINGS) ×1 IMPLANT
DRSG MEPILEX SACRM 8.7X9.8 (GAUZE/BANDAGES/DRESSINGS) ×1 IMPLANT
DRSG OPSITE POSTOP 4X14 (GAUZE/BANDAGES/DRESSINGS) ×1 IMPLANT
DRSG TEGADERM 4X4.75 (GAUZE/BANDAGES/DRESSINGS) ×1 IMPLANT
DURAPREP 26ML APPLICATOR (WOUND CARE) ×2 IMPLANT
ELECT CAUTERY BLADE 6.4 (BLADE) ×1 IMPLANT
ELECT REM PT RETURN 9FT ADLT (ELECTROSURGICAL) ×1
ELECTRODE REM PT RTRN 9FT ADLT (ELECTROSURGICAL) ×1 IMPLANT
EX-PIN ORTHOLOCK NAV 4X150 (PIN) ×2 IMPLANT
GLOVE BIOGEL M STRL SZ7.5 (GLOVE) ×2 IMPLANT
GLOVE BIOGEL PI IND STRL 7.5 (GLOVE) ×1 IMPLANT
GLOVE PI ORTHO PRO STRL 7.5 (GLOVE) ×2 IMPLANT
GLOVE SRG 8 PF TXTR STRL LF DI (GLOVE) ×1 IMPLANT
GLOVE SURG UNDER POLY LF SZ7.5 (GLOVE) ×1 IMPLANT
GLOVE SURG UNDER POLY LF SZ8 (GLOVE) ×1
GOWN STRL REUS W/ TWL LRG LVL3 (GOWN DISPOSABLE) ×1 IMPLANT
GOWN STRL REUS W/ TWL XL LVL3 (GOWN DISPOSABLE) ×1 IMPLANT
GOWN STRL REUS W/TWL LRG LVL3 (GOWN DISPOSABLE) ×1
GOWN STRL REUS W/TWL XL LVL3 (GOWN DISPOSABLE) ×1
GOWN TOGA ZIPPER T7+ PEEL AWAY (MISCELLANEOUS) ×2 IMPLANT
HEMOVAC 400CC 10FR (MISCELLANEOUS) ×1 IMPLANT
HOLDER FOLEY CATH W/STRAP (MISCELLANEOUS) ×1 IMPLANT
IV NS IRRIG 3000ML ARTHROMATIC (IV SOLUTION) ×1 IMPLANT
JET LAVAGE IRRISEPT WOUND (IRRIGATION / IRRIGATOR) ×1
KIT TURNOVER KIT A (KITS) ×1 IMPLANT
KNIFE SCULPS 14X20 (INSTRUMENTS) ×1 IMPLANT
LAVAGE JET IRRISEPT WOUND (IRRIGATION / IRRIGATOR) IMPLANT
MANIFOLD NEPTUNE II (INSTRUMENTS) ×2 IMPLANT
NDL SPNL 20GX3.5 QUINCKE YW (NEEDLE) ×2 IMPLANT
NEEDLE SPNL 20GX3.5 QUINCKE YW (NEEDLE) ×2 IMPLANT
NS IRRIG 500ML POUR BTL (IV SOLUTION) ×1 IMPLANT
PACK TOTAL KNEE (MISCELLANEOUS) ×1 IMPLANT
PAD ABD DERMACEA PRESS 5X9 (GAUZE/BANDAGES/DRESSINGS) ×2 IMPLANT
PAD WRAPON POLAR KNEE (MISCELLANEOUS) ×1 IMPLANT
PATELLA MEDIAL ATTUN 35MM KNEE (Knees) IMPLANT
PIN DRILL FIX HALF THREAD (BIT) ×2 IMPLANT
PIN FIXATION 1/8DIA X 3INL (PIN) ×1 IMPLANT
PULSAVAC PLUS IRRIG FAN TIP (DISPOSABLE) ×1
SOL PREP PVP 2OZ (MISCELLANEOUS)
SOLUTION IRRIG SURGIPHOR (IV SOLUTION) ×1 IMPLANT
SOLUTION PREP PVP 2OZ (MISCELLANEOUS) ×1 IMPLANT
SPONGE DRAIN TRACH 4X4 STRL 2S (GAUZE/BANDAGES/DRESSINGS) ×1 IMPLANT
STAPLER SKIN PROX 35W (STAPLE) ×1 IMPLANT
STOCKINETTE IMPERV 14X48 (MISCELLANEOUS) ×1 IMPLANT
STRAP TIBIA SHORT (MISCELLANEOUS) ×1 IMPLANT
SUCTION FRAZIER HANDLE 10FR (MISCELLANEOUS) ×1
SUCTION TUBE FRAZIER 10FR DISP (MISCELLANEOUS) ×1 IMPLANT
SUT VIC AB 0 CT1 36 (SUTURE) ×1 IMPLANT
SUT VIC AB 1 CT1 36 (SUTURE) ×2 IMPLANT
SUT VIC AB 2-0 CT2 27 (SUTURE) ×1 IMPLANT
SYR 30ML LL (SYRINGE) ×2 IMPLANT
TIP FAN IRRIG PULSAVAC PLUS (DISPOSABLE) ×1 IMPLANT
TOWEL OR 17X26 4PK STRL BLUE (TOWEL DISPOSABLE) IMPLANT
TOWER CARTRIDGE SMART MIX (DISPOSABLE) ×1 IMPLANT
TRAP FLUID SMOKE EVACUATOR (MISCELLANEOUS) ×1 IMPLANT
TRAY FOLEY MTR SLVR 16FR STAT (SET/KITS/TRAYS/PACK) ×1 IMPLANT
WATER STERILE IRR 1000ML POUR (IV SOLUTION) ×1 IMPLANT
WRAPON POLAR PAD KNEE (MISCELLANEOUS) ×1

## 2022-08-24 NOTE — Anesthesia Procedure Notes (Signed)
Spinal  Patient location during procedure: OR Start time: 08/24/2022 7:29 AM End time: 08/24/2022 7:35 AM Reason for block: surgical anesthesia Staffing Performed: resident/CRNA  Anesthesiologist: Piscitello, Precious Haws, MD Resident/CRNA: Nelda Marseille, CRNA Performed by: Nelda Marseille, CRNA Authorized by: Martha Clan, MD   Preanesthetic Checklist Completed: patient identified, IV checked, site marked, risks and benefits discussed, surgical consent, monitors and equipment checked, pre-op evaluation and timeout performed Spinal Block Patient position: sitting Prep: ChloraPrep Patient monitoring: heart rate, continuous pulse ox, blood pressure and cardiac monitor Approach: midline Location: L3-4 Injection technique: single-shot Needle Needle type: Whitacre and Introducer  Needle gauge: 25 G Needle length: 9 cm Assessment Sensory level: T10 Events: CSF return Additional Notes Sterile aseptic technique used throughout the procedure.  Negative paresthesia. Negative blood return. Positive free-flowing CSF. Expiration date of kit checked and confirmed. Patient tolerated procedure well, without complications.

## 2022-08-24 NOTE — Anesthesia Procedure Notes (Addendum)
Date/Time: 08/24/2022 7:35 AM  Performed by: Nelda Marseille, CRNAPre-anesthesia Checklist: Patient identified, Emergency Drugs available, Patient being monitored, Suction available and Timeout performed Oxygen Delivery Method: Simple face mask

## 2022-08-24 NOTE — Evaluation (Signed)
Physical Therapy Evaluation Patient Details Name: Brittany Nguyen MRN: [id number] DOB: [birth year] Today's Date: 08/24/2022  History of Present Illness  56 y/o female s/p L TKA 12/20.  Clinical Impression  Pt in good spirits and eager to work with PT. She had expected pain hesitancy with some exercises but showed good effort t/o the session.  She showed great quad engagement and did SLRs w/o warm up.  She did not need assist to get to EOB, and with cuing for set up and sequencing was able to rise to standing a few times w/o direct assist. Pt slow, deliberate, but safe with all transitions.  She was able to ambulate ~100 ft using walker, able to maintain consistent cadence with good relative confidence by the end.  Pt does have steps w/o rails and will need training on these before she goes home.  Pt will benefit from continued PT per TKA protocol.       Recommendations for follow up therapy are one component of a multi-disciplinary discharge planning process, led by the attending physician.  Recommendations may be updated based on patient status, additional functional criteria and insurance authorization.  Follow Up Recommendations Follow physician's recommendations for discharge plan and follow up therapies      Assistance Recommended at Discharge Set up Supervision/Assistance  Patient can return home with the following  A little help with walking and/or transfers;A little help with bathing/dressing/bathroom;Assistance with cooking/housework;Assist for transportation;Help with stairs or ramp for entrance    Equipment Recommendations Rolling walker (2 wheels);BSC/3in1  Recommendations for Other Services       Functional Status Assessment Patient has had a recent decline in their functional status and demonstrates the ability to make significant improvements in function in a reasonable and predictable amount of time.     Precautions / Restrictions Precautions Precautions:  Fall Restrictions Weight Bearing Restrictions: Yes RLE Weight Bearing: Weight bearing as tolerated      Mobility  Bed Mobility Overal bed mobility: Modified Independent             General bed mobility comments: Minimal cuing, able to get herself up to sitting w/o physical assist    Transfers Overall transfer level: Needs assistance Equipment used: Rolling walker (2 wheels) Transfers: Sit to/from Stand Sit to Stand: Min guard           General transfer comment: Cues for set up and sequencing, able to rise w/o direct assit but, heavy UE use    Ambulation/Gait Ambulation/Gait assistance: Min guard Gait Distance (Feet): 100 Feet Assistive device: Rolling walker (2 wheels)         General Gait Details: Pt with typical hesitancy with first few steps, but able to attain slow but consistent cadence in short order.  O2 and HR in the mid 90s after the effort - No LOBs, appropriate UE use, good effort.  Stairs            Wheelchair Mobility    Modified Rankin (Stroke Patients Only)       Balance Overall balance assessment: Modified Independent                                           Pertinent Vitals/Pain Pain Assessment Pain Assessment: 0-10 Pain Score: 5  Pain Location: L knee, expected increase with exercises/ROM    Home Living Family/patient expects to be discharged to:: Private residence Living  Arrangements: Spouse/significant other Available Help at Discharge: Available 24 hours/day   Home Access: Stairs to enter Entrance Stairs-Rails: None Entrance Stairs-Number of Steps: 4   Home Layout: One level Home Equipment: Rollator (4 wheels)      Prior Function Prior Level of Function : Independent/Modified Independent                     Hand Dominance        Extremity/Trunk Assessment   Upper Extremity Assessment Upper Extremity Assessment: Overall WFL for tasks assessed    Lower Extremity Assessment Lower  Extremity Assessment: Overall WFL for tasks assessed (expected post-op weakness, able to SLR w/o warm up)       Communication   Communication: No difficulties  Cognition Arousal/Alertness: Awake/alert Behavior During Therapy: WFL for tasks assessed/performed Overall Cognitive Status: Within Functional Limits for tasks assessed                                          General Comments      Exercises Total Joint Exercises Ankle Circles/Pumps: AROM, 10 reps Quad Sets: Strengthening, 15 reps Short Arc Quad: AROM, 10 reps Heel Slides: AROM, Strengthening, 5 reps (with light leg ext resistance) Hip ABduction/ADduction: Strengthening, 10 reps, AROM Straight Leg Raises: AROM, 10 reps Knee Flexion: PROM, 5 reps Goniometric ROM: 1-77   Assessment/Plan    PT Assessment Patient needs continued PT services  PT Problem List Decreased strength;Decreased range of motion;Decreased activity tolerance;Decreased balance;Decreased mobility;Decreased knowledge of use of DME;Decreased safety awareness;Decreased knowledge of precautions       PT Treatment Interventions Gait training;DME instruction;Stair training;Functional mobility training;Therapeutic exercise;Therapeutic activities;Balance training;Patient/family education    PT Goals (Current goals can be found in the Care Plan section)  Acute Rehab PT Goals Patient Stated Goal: go home tomorrow PT Goal Formulation: With patient Time For Goal Achievement: 09/06/22 Potential to Achieve Goals: Good    Frequency BID     Co-evaluation               AM-PAC PT "6 Clicks" Mobility  Outcome Measure Help needed turning from your back to your side while in a flat bed without using bedrails?: None Help needed moving from lying on your back to sitting on the side of a flat bed without using bedrails?: None Help needed moving to and from a bed to a chair (including a wheelchair)?: A Little Help needed standing up from a  chair using your arms (e.g., wheelchair or bedside chair)?: A Little Help needed to walk in hospital room?: A Little Help needed climbing 3-5 steps with a railing? : A Lot 6 Click Score: 19    End of Session Equipment Utilized During Treatment: Gait belt Activity Tolerance: Patient tolerated treatment well Patient left: with call bell/phone within reach;in chair;with family/visitor present;with nursing/sitter in room Nurse Communication: Mobility status PT Visit Diagnosis: Muscle weakness (generalized) (M62.81);Difficulty in walking, not elsewhere classified (R26.2);Pain Pain - Right/Left: Left Pain - part of body: Knee    Time: 6578-4696 PT Time Calculation (min) (ACUTE ONLY): 39 min   Charges:   PT Evaluation $PT Eval Low Complexity: 1 Low PT Treatments $Gait Training: 8-22 mins $Therapeutic Exercise: 8-22 mins        Kreg Shropshire, DPT 08/24/2022, 5:53 PM

## 2022-08-24 NOTE — Op Note (Signed)
OPERATIVE NOTE  DATE OF SURGERY:  08/24/2022  PATIENT NAME:  Brittany Nguyen   DOB: Aug 30, 1966  MRN: [id number]  PRE-OPERATIVE DIAGNOSIS: Degenerative arthrosis of the left knee, primary  POST-OPERATIVE DIAGNOSIS:  Same  PROCEDURE:  Left total knee arthroplasty using computer-assisted navigation  SURGEON:  Marciano Sequin. M.D.  ASSISTANT: Cassell Smiles, PA-C (present and scrubbed throughout the case, critical for assistance with exposure, retraction, instrumentation, and closure)  ANESTHESIA: spinal and general  ESTIMATED BLOOD LOSS: 50 mL  FLUIDS REPLACED: 1400 mL of crystalloid  TOURNIQUET TIME: 71 minutes  DRAINS: 2 medium Hemovac drains  SOFT TISSUE RELEASES: Anterior cruciate ligament, posterior cruciate ligament, deep medial collateral ligament, patellofemoral ligament  IMPLANTS UTILIZED: DePuy Attune size 4N posterior stabilized femoral component (cemented), size 4 rotating platform tibial component (cemented), 35 mm medialized dome patella (cemented), and a 5 mm stabilized rotating platform polyethylene insert.  INDICATIONS FOR SURGERY: Brittany Nguyen is a 56 y.o. year old female with a long history of progressive knee pain. X-rays demonstrated severe degenerative changes in tricompartmental fashion. The patient had not seen any significant improvement despite conservative nonsurgical intervention. After discussion of the risks and benefits of surgical intervention, the patient expressed understanding of the risks benefits and agree with plans for total knee arthroplasty.   The risks, benefits, and alternatives were discussed at length including but not limited to the risks of infection, bleeding, nerve injury, stiffness, blood clots, the need for revision surgery, cardiopulmonary complications, among others, and they were willing to proceed.  PROCEDURE IN DETAIL: The patient was brought into the operating room and, after adequate spinal and general anesthesia was  achieved, a tourniquet was placed on the patient's upper thigh. The patient's knee and leg were cleaned and prepped with alcohol and DuraPrep and draped in the usual sterile fashion. A "timeout" was performed as per usual protocol. The lower extremity was exsanguinated using an Esmarch, and the tourniquet was inflated to 300 mmHg. An anterior longitudinal incision was made followed by a standard mid vastus approach. The deep fibers of the medial collateral ligament were elevated in a subperiosteal fashion off of the medial flare of the tibia so as to maintain a continuous soft tissue sleeve. The patella was subluxed laterally and the patellofemoral ligament was incised. Inspection of the knee demonstrated severe degenerative changes with full-thickness loss of articular cartilage. Osteophytes were debrided using a rongeur. Anterior and posterior cruciate ligaments were excised. Two 4.0 mm Schanz pins were inserted in the femur and into the tibia for attachment of the array of trackers used for computer-assisted navigation. Hip center was identified using a circumduction technique. Distal landmarks were mapped using the computer. The distal femur and proximal tibia were mapped using the computer. The distal femoral cutting guide was positioned using computer-assisted navigation so as to achieve a 5 distal valgus cut. The femur was sized and it was felt that a size 4N femoral component was appropriate. A size 4 femoral cutting guide was positioned and the anterior cut was performed and verified using the computer. This was followed by completion of the posterior and chamfer cuts. Femoral cutting guide for the central box was then positioned in the center box cut was performed.  Attention was then directed to the proximal tibia. Medial and lateral menisci were excised. The extramedullary tibial cutting guide was positioned using computer-assisted navigation so as to achieve a 0 varus-valgus alignment and 3  posterior slope. The cut was performed and verified using the computer.  The proximal tibia was sized and it was felt that a size 4 tibial tray was appropriate. Tibial and femoral trials were inserted followed by insertion of a 5 mm polyethylene insert. This allowed for excellent mediolateral soft tissue balancing both in flexion and in full extension. Finally, the patella was cut and prepared so as to accommodate a 35 mm medialized dome patella. A patella trial was placed and the knee was placed through a range of motion with excellent patellar tracking appreciated. The femoral trial was removed after debridement of posterior osteophytes. The central post-hole for the tibial component was reamed followed by insertion of a keel punch. Tibial trials were then removed. Cut surfaces of bone were irrigated with copious amounts of normal saline using pulsatile lavage and then suctioned dry. Polymethylmethacrylate cement with gentamicin was prepared in the usual fashion using a vacuum mixer. Cement was applied to the cut surface of the proximal tibia as well as along the undersurface of a size 4 rotating platform tibial component. Tibial component was positioned and impacted into place. Excess cement was removed using [employer] fast streamer. Cement was then applied to the cut surfaces of the femur as well as along the posterior flanges of the size 4N femoral component. The femoral component was positioned and impacted into place. Excess cement was removed using [employer] fast streamer. A 5 mm polyethylene trial was inserted and the knee was brought into full extension with steady axial compression applied. Finally, cement was applied to the backside of a 35 mm medialized dome patella and the patellar component was positioned and patellar clamp applied. Excess cement was removed using [employer] fast streamer. After adequate curing of the cement, the tourniquet was deflated after a total tourniquet time of 71 minutes. Hemostasis was achieved using  electrocautery. The knee was irrigated with copious amounts of normal saline using pulsatile lavage followed by 450 ml of Irrisept.  The Irrisept was allowed to soak for several minutes, irrigated with normal saline and then suctioned dry. 20 mL of 1.3% Exparel and 60 mL of 0.25% Marcaine in 40 mL of normal saline was injected along the posterior capsule, medial and lateral gutters, and along the arthrotomy site. A 5 mm stabilized rotating platform polyethylene insert was inserted and the knee was placed through a range of motion with excellent mediolateral soft tissue balancing appreciated and excellent patellar tracking noted. 2 medium drains were placed in the wound bed and brought out through separate stab incisions. The medial parapatellar portion of the incision was reapproximated using interrupted sutures of #1 Vicryl. Subcutaneous tissue was approximated in layers using first #0 Vicryl followed #2-0 Vicryl. The skin was approximated with skin staples. A sterile dressing was applied.  The patient tolerated the procedure well and was transported to the recovery room in stable condition.    Ibtisam Benge P. Holley Bouche., M.D.

## 2022-08-24 NOTE — Anesthesia Procedure Notes (Signed)
Procedure Name: LMA Insertion Date/Time: 08/24/2022 8:00 AM  Performed by: Nelda Marseille, CRNAPre-anesthesia Checklist: Patient identified, Patient being monitored, Timeout performed, Emergency Drugs available and Suction available Patient Re-evaluated:Patient Re-evaluated prior to induction Oxygen Delivery Method: Circle system utilized Preoxygenation: Pre-oxygenation with 100% oxygen Induction Type: IV induction Ventilation: Mask ventilation without difficulty LMA: LMA inserted LMA Size: 4.0 Tube type: Oral Number of attempts: 1 Placement Confirmation: positive ETCO2 and breath sounds checked- equal and bilateral Tube secured with: Tape Dental Injury: Teeth and Oropharynx as per pre-operative assessment

## 2022-08-24 NOTE — Progress Notes (Signed)
Spoke with anesthesia: Dr. Rosey Bath regarding bp, sbp 80's  MAP greater then 60.  Patient awake/alert x4. Per Dr. Rosey Bath continue to monitor closely.

## 2022-08-24 NOTE — Progress Notes (Signed)
Please note: patient states baseline bp 88-90 SBP Anesthesia aware:  Dr. Rosey Bath

## 2022-08-24 NOTE — Interval H&P Note (Signed)
History and Physical Interval Note:  08/24/2022 6:16 AM  Brittany Nguyen  has presented today for surgery, with the diagnosis of PRIMARY OSTEOARTHRITIS OF LEFT KNEE..  The various methods of treatment have been discussed with the patient and family. After consideration of risks, benefits and other options for treatment, the patient has consented to  Procedure(s): COMPUTER ASSISTED TOTAL KNEE ARTHROPLASTY (Left) as a surgical intervention.  The patient's history has been reviewed, patient examined, no change in status, stable for surgery.  I have reviewed the patient's chart and labs.  Questions were answered to the patient's satisfaction.     [place]

## 2022-08-24 NOTE — H&P (Signed)
ORTHOPAEDIC HISTORY & PHYSICAL Gwenlyn Fudge, [place] - 08/16/2022 9:45 AM EST Formatting of this note is different from the original. [place] [facility] Chief Complaint:  Chief Complaint Patient presents with Knee Pain H & P LEFT KNEE  History of Present Illness:  Brittany Nguyen is a 56 y.o. female that presents to clinic today for her preoperative history and evaluation. Patient presents unaccompanied. The patient is scheduled to undergo a left total knee arthroplasty on 08/24/22 by Dr. Marry Guan. Her pain began several years ago. The pain is located primarily along the medial aspect of the knee She describes her pain as worse with weightbearing. She reports associated swelling with some giving way of the knee. She denies associated numbness or tingling, denies locking.  The patient's symptoms have progressed to the point that they decrease her quality of life. The patient has previously undergone conservative treatment including NSAIDS and injections to the knee without adequate control of her symptoms.  Patient denies history of lumbar surgery. She is followed by cardiology for her pacemaker and atrial fibrillation but reports she has received clearance for surgery. 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 Patient also followed by oncology for stomach cancer and neurology for history of seizures. She is sees Dr. Landry Mellow of [facility] and takes fentanyl, morphine, and oxycodone for pain control.  Of note, patient does report rash and facial swelling with use of penicillin previously. IgE for penicillin within normal limits.  Past Medical, Surgical, Family, Social History, Allergies, Medications:  Past Medical History: Past Medical History: Diagnosis Date A-fib (CMS-HCC) Abdominal wall pain in right flank 04/10/2018 Acute myocardial infarction of anterolateral wall, initial episode of care (CMS-HCC) 08/2016 Acute on  chronic systolic congestive heart failure (CMS-HCC) 08/05/2016 Acute respiratory failure (CMS-HCC) 09/24/2017 Anemia Anemia IRON DEFICIENCY- PATIENT [place] Anesthesia complication chest pain with ED visit after 08/2018 EGD thought to be r/t procedure or CO2 use Arthritis Asthma without status asthmaticus seasonal Automatic implantable cardioverter-defibrillator in situ 04/07/2017 Breast cancer (CMS-HCC) right C. difficile colitis 01/14/2018 CAD (coronary artery disease) cardiologist Billey Chang PA -Dr Tula Nakayama [facility] in [place] seen 1 month ago -no changes Change in bowel habits 04/10/2018 Chest pain 01/11/2013 Added automatically from request for surgery [id number] CHF (congestive heart failure) (CMS-HCC) EF 25% Chronic diarrhea of unknown origin 06/21/2018 Community acquired pneumonia 08/27/2016 Cough, persistent 11/22/2015 Depression Diverticulitis of colon 01/20/2009 Overview: Qualifier: Diagnosis of By: Loanne Drilling MD, Jacelyn Pi Qualifier: Diagnosis of By: Loanne Drilling MD, Sean A DVT (deep venous thrombosis) (CMS-HCC) x3 Encounter for anticoagulation discussion and counseling 08/08/2018 Esophageal reflux 01/20/2009 Overview: Qualifier: Diagnosis of By: Loanne Drilling MD, Hilliard Clark A GERD (gastroesophageal reflux disease) GIB (gastrointestinal bleeding) 01/08/2018 H/O syncope 09/18/2014 Heart palpitations 10/17/2014 History of anesthesia reaction slow to wake up History of blood transfusion History of DVT (deep vein thrombosis) 08/08/2018 Hx MRSA infection 06/21/2018 Hypoglycemia, unspecified 01/20/2009 Overview: Qualifier: Diagnosis of By: Loanne Drilling MD, Sean A Hypokalemia 11/22/2015 Hypotension 02/03/2017 Influenza A 10/29/2017 Intractable migraine with aura without status migrainosus 06/20/2018 Iron deficiency anemia 03/27/2018 Leukocytosis 11/22/2015 Lump or mass in breast 08/08/2018 Migraine headache Mixed hyperlipidemia 04/17/2014 MRSA  (methicillin resistant Staphylococcus aureus) not sure was MRSA NICM (nonischemic cardiomyopathy) (CMS-HCC) 03/27/2017 Added automatically from request for surgery [id number] Nontoxic multinodular goiter 01/20/2009 Overview: Qualifier: Diagnosis of By: Loanne Drilling MD, Sean A Obesity OSA on CPAP 04/17/2014 Pacemaker Pain in right knee 05/15/2018 PONV (postoperative nausea and vomiting) Relieved with pre-meds  Poor intravenous access Postcholecystectomy diarrhea 04/10/2018 Presence of automatic implantable cardioverter-defibrillator PACEMAKER/ICD BY [employer] Reactive airway disease that is not asthma 08/27/2016 Restless legs syndrome 08/27/2016 Seizure (CMS-HCC) 12/25/2020 Seizures (CMS-HCC) Sepsis (CMS-HCC) 07/03/2017 Sleep apnea CPAP with a mask uses everynight SOB (shortness of breath) 11/01/2017 Syncope and collapse 09/18/2014 Tremor Type 2 diabetes mellitus without complication (CMS-HCC) 38/88/2800 Unstable angina (CMS-HCC) 08/08/2018 Weight gain  Past Surgical History: Past Surgical History: Procedure Laterality Date CHOLECYSTECTOMY 2005 COLON SURGERY 2010 sigmoid resection CARDIAC SURGERY 03/2017 pacemaker/defibrillator - [employer] at [facility] in [place] 02/22/2018 Negative colon biopsy/Diverticulosis/Repeat 73yr/TKT EGD 02/22/2018 Duodenitis/Intestinal metaplasia/Gastritis/neuroendocrine cell hyperplasia/Repeat 261yrTKT THYROIDECTOMY TOTAL 03/2018 [facility] ESOPHAGOGASTRODOUDENOSCOPY W/BIOPSY N/A 03/09/2018 Procedure: ESOPHAGOGASTRODUODENOSCOPY, FLEXIBLE, TRANSORAL; WITH BIOPSY, SINGLE OR MULTIPLE; Surgeon: ObIvery QualeMD; [facility]; Service: Gastroenterology; Laterality: N/A; ESOPHAGOGASTRODOUDENOSCOPY W/BIOPSY N/A 08/24/2018 Procedure: ESOPHAGOGASTRODUODENOSCOPY, FLEXIBLE, TRANSORAL; WITH BIOPSY, SINGLE OR MULTIPLE; Surgeon: ObIvery QualeMD; [facility]; Service: Gastroenterology; Laterality:  N/A; ESOPHAGOGASTRODOUDENOSCOPY W/BIOPSY N/A 02/25/2019 Procedure: EGD/EUS; Surgeon: ObIvery QualeMD; [facility]; Service: Gastroenterology; Laterality: N/A; ESOPHAGOGASTRODOUDENOSCOPY W/INJECTION N/A 02/25/2019 Procedure: ESOPHAGOGASTRODUODENOSCOPY, FLEXIBLE, TRANSORAL; WITH DIRECTED SUBMUCOSAL INJECTION(S), ANY SUBSTANCE; Surgeon: ObIvery QualeMD; [facility]; Service: Gastroenterology; Laterality: N/A; SURVEILLANCE COLONOSCOPY N/A 11/01/2019 Procedure: COLONOSCOPY/ UPPER EUS; Surgeon: ObIvery QualeMD; [place] [place]; Laterality: N/A; ESOPHAGOGASTRODOUDENOSCOPY W/ULTRASOUND EXAMINATION N/A 11/01/2019 Procedure: UPPER EUS; ESOPHAGOGASTRODUODENOSCOPY, FLEXIBLE, TRANSORAL; W/ ENDOSCOPIC ULTRASOUND EXAM, ESOPHAGUS, STOMACH, DUODENUM OR SURGICALLY ALTERED STOMACH WHERE JEJUNUM IS EXAMINED DISTAL TO ANASTOMOSIS; Surgeon: ObIvery QualeMD; [place]: [place]; Laterality: N/A; COLONOSCOPY W/BIOPSY N/A 12/13/2019 Procedure: COLONOSCOPY, FLEXIBLE; WITH BIOPSY, SINGLE OR MULTIPLE; Surgeon: ObIvery QualeMD; [facility]; Service: Gastroenterology; Laterality: N/A; ESOPHAGOGASTRODOUDENOSCOPY W/BIOPSY N/A 12/13/2019 Procedure: ESOPHAGOGASTRODUODENOSCOPY, FLEXIBLE, TRANSORAL; WITH BIOPSY, SINGLE OR MULTIPLE; Surgeon: ObIvery QualeMD; [facility]; Service: Gastroenterology; Laterality: N/A; ESOPHAGOGASTRODOUDENOSCOPY W/BIOPSY N/A 11/29/2021 Procedure: UPPER EUS; Surgeon: ObIvery QualeMD; [facility]; Service: Gastroenterology; Laterality: N/A; COLONOSCOPY W/BIOPSY N/A 05/25/2022 Procedure: COLONOSCOPY, FLEXIBLE; WITH BIOPSY, SINGLE OR MULTIPLE; Surgeon: BaWardell HeathMD; [facility]; Service: Gastroenterology; Laterality: N/A; CESAREAN SECTION COLONOSCOPY HYSTERECTOMY INSERT / REPLACE /  REMOVE PACEMAKER  Current Medications: Current Outpatient Medications Medication Sig Dispense Refill albuterol (ACCUNEB) 1.25 mg/3 mL nebulizer solution Take 3 mLs (1.25 mg total) by nebulization as needed for Wheezing 75 mL 2 albuterol 90 mcg/actuation inhaler Inhale 2 inhalations into the lungs every 6 (six) hours as needed for Wheezing or Shortness of Breath beclomethasone dipropionate (QVAR REDIHALER HFA) 80 mcg/actuation inhaler Inhale 1 inhalation into the lungs as needed escitalopram oxalate (LEXAPRO) 10 MG tablet Take 10 mg by mouth at bedtime fentaNYL (DURAGESIC) 75 mcg/hr patch Place 1 patch onto the skin every third day for 30 days 10 patch 0 FUROsemide (LASIX) 40 MG tablet Take 80 mg by mouth 2 (two) times daily gabapentin (NEURONTIN) 100 MG capsule Take 1 capsule (100 mg total) by mouth at bedtime 90 capsule 3 lacosamide (VIMPAT) 100 mg tablet Take 100 mg by mouth every 12 (twelve) hours levothyroxine (SYNTHROID) 175 MCG tablet TAKE ONE TABLET BY MOUTH DAILY AT 8AM 30 tablet 1 LORazepam (ATIVAN) 1 MG tablet Take 1 tablet (1 mg total) by mouth every 6 (six) hours as needed (for nausea/vomiting, anxiety, or sleep) 60 tablet 1 losartan (COZAAR) 25 MG tablet Take 12.5 mg by mouth 2 (two) times daily metoprolol succinate (TOPROL-XL) 50 MG XL tablet Take 50 mg by mouth 2 (two) times daily morphine (MSIR) 15 MG immediate  release tablet Take 1 tablet (15 mg total) by mouth every 4 (four) hours as needed for Pain for up to 20 days 60 tablet 0 nitroGLYcerin (NITROSTAT) 0.4 MG SL tablet Place 0.4 mg under the tongue every 5 (five) minutes as needed for Chest pain May take up to 3 doses. oxyCODONE-acetaminophen (PERCOCET) 5-325 mg tablet Take 1-2 tablets by mouth every 4 (four) hours as needed for Pain for up to 22 days 180 tablet 0 potassium chloride (KLOR-CON) 20 MEQ ER tablet Take 20 mEq by mouth 2 (two) times daily promethazine (PHENERGAN) 25 MG tablet Take 1 tablet (25 mg total) by mouth  every 4 (four) hours as needed for Nausea 120 tablet 1 rOPINIRole (REQUIP) 4 MG tablet TAKE 1 TABLET(4 MG) BY MOUTH EVERY NIGHT 90 tablet 3 naloxone (NARCAN) 4 mg/actuation nasal spray Place 1 spray (4 mg total) into one nostril once as needed for up to 1 dose For somnolence, decrease respiratory rate due opioid use. 1 each 0  No current facility-administered medications for this visit.  Allergies: Allergies Allergen Reactions Iodinated Contrast Media Anaphylaxis Iron Sucrose Anaphylaxis Penicillins Anaphylaxis Immediate rash, facial/tongue/throat swelling, SOB, lightheadedness, hypotension, tachicardia Sacubitril-Valsartan Other (See Comments) Seizure- not sure if caused by new start to entresto vs hypoglycemia Latex, Natural Rubber Hives Lidocaine Hives Budesonide Itching Hydromorphone Rash Imdur [Isosorbide Mononitrate] Headache Ondansetron Headache Severe headache Povidone-Iodine Rash Tizanidine Other (See Comments) "Altered mental feeling"  Social History: Social History  Socioeconomic History Marital status: Married Spouse name: Brittany Nguyen Number of children: 2 Years of education: 16 Highest education level: Bachelor's degree (e.g., BA, AB, BS) Occupational History Occupation: Disabled- Programmer, applications Tobacco Use Smoking status: Never Smokeless tobacco: Never Vaping Use Vaping Use: Never used Substance and Sexual Activity Alcohol use: No Drug use: Never Sexual activity: Defer  Social Determinants of Health  Financial Resource Strain: High Risk (12/07/2020) Overall Financial Resource Strain (CARDIA) Difficulty of Paying Living Expenses: Hard Food Insecurity: Food Insecurity Present (12/07/2020) Hunger Vital Sign Worried About Running Out of Food in the Last Year: Often true Ran Out of Food in the Last Year: Often true Transportation Needs: Unmet Transportation Needs (12/07/2020) PRAPARE - Control and instrumentation engineer (Medical): No Lack of  Transportation (Non-Medical): Yes Physical Activity: Insufficiently Active (12/07/2020) Exercise Vital Sign Days of Exercise per Week: 2 days Minutes of Exercise per Session: 20 min Stress: Stress Concern Present (12/07/2020) [place] Feeling of Stress : Rather much Social Connections: Moderately Isolated (12/07/2020) Social Connection and Isolation Panel [NHANES] Frequency of Communication with Friends and Family: More than three times a week Frequency of Social Gatherings with Friends and Family: More than three times a week Attends Religious Services: Never Marine scientist or Organizations: No Attends Archivist Meetings: Never Marital Status: Married Housing Stability: High Risk (12/07/2020) Housing Stability Vital Sign Unable to Pay for Housing in the Last Year: Yes Number of [place] in the Last Year: 1 Unstable Housing in the Last Year: No  Family History: Family History Problem Relation Age of Onset Anesthesia problems Mother cardiac arrest during heart catheterization Myocardial Infarction (Heart attack) Mother Diabetes type II Mother Diabetes Mother Anesthesia problems Father slow to wake postop, awareness under anesthesia Myocardial Infarction (Heart attack) Father Diabetes type II Father Colon cancer Father Prostate cancer Father Asthma Father Brain cancer Father Diabetes Father Migraines Father Diabetes type II Sister Diabetes Sister Liver disease Sister Diabetes type II Sister Diabetes type II Sister Cancer Other Other Other  bleeding problems Coronary Artery Disease (Blocked arteries around heart) Other Ulcers Other Malignant hypertension Neg Hx Malignant hyperthermia Neg Hx Pseudochol deficiency Neg Hx PONV Neg Hx  Review of Systems:  A 10+ ROS was performed, reviewed, and the pertinent orthopaedic findings are documented in the HPI.  Physical Examination:  BP  120/80 (BP Location: Left upper arm, Patient Position: Sitting, BP Cuff Size: Adult)  Ht 165.1 cm ('5\' 5"'$ )  Wt 89.9 kg (198 lb 3.2 oz)  LMP (LMP Unknown) Comment: per pt , " hysterectomy in 2010 "  BMI 32.98 kg/m  Patient is a well-developed, well-nourished female in no acute distress. Patient has normal mood and affect. Patient is alert and oriented to person, place, and time.  HEENT: Atraumatic, normocephalic. Pupils equal and reactive to light. Extraocular motion intact. Noninjected sclera.  Cardiovascular: Regular rate and rhythm, with no murmurs, rubs, or gallops. Distal pulses palpable.  Respiratory: Lungs clear to auscultation bilaterally.  Left Knee: Soft tissue swelling: mild Effusion: none Erythema: none Crepitance: mild Tenderness: medial Alignment: relative varus Mediolateral laxity: medial pseudolaxity Posterior sag: negative Patellar tracking: Good tracking without evidence of subluxation or tilt Atrophy: No significant atrophy. Quadriceps tone was fair to good. Range of motion: 0/8/93 degrees  Able to plantarflex and dorsiflex the left ankle. Able to flex and extend the toes.  Sensation intact over the saphenous, lateral sural cutaneous, superficial fibular, and deep fibular nerve distributions.  Tests Performed/Reviewed: X-rays  No new x-rays of the left knee were obtained. Previous x-rays were reviewed and show complete loss of medial compartment joint space with osteophyte formation and bone-on-bone contact. No fractures or other osseous abnormality noted.  Impression:  ICD-10-CM 1. Primary osteoarthritis of left knee M17.12  Plan:  The patient has end-stage degenerative changes of the left knee. It was explained to the patient that the condition is progressive in nature. Having failed conservative treatment, the patient has elected to proceed with a total joint arthroplasty. The patient will undergo a total joint arthroplasty with Dr. Marry Guan. The risks  of surgery, including blood clot and infection, were discussed with the patient. Measures to reduce these risks, including the use of anticoagulation, perioperative antibiotics, and early ambulation were discussed. The importance of postoperative physical therapy was discussed with the patient. The patient elects to proceed with surgery. The patient is instructed to stop all blood thinners prior to surgery. The patient is instructed to call the hospital the day before surgery to learn of the proper arrival time.  Contact our office with any questions or concerns. Follow up as indicated, or sooner should any new problems arise, if conditions worsen, or if they are otherwise concerned.  Gwenlyn Fudge, [place] [facility] [place], [place] [address] Phone: [phone]  This note was generated in part with voice recognition software and I apologize for any typographical errors that were not detected and corrected.  Electronically signed by Gwenlyn Fudge, PA at 08/17/2022 11:30 AM EST

## 2022-08-24 NOTE — Transfer of Care (Signed)
Immediate Anesthesia Transfer of Care Note  Patient: Brittany Nguyen  Procedure(s) Performed: COMPUTER ASSISTED TOTAL KNEE ARTHROPLASTY (Left: Knee)  Patient Location: PACU  Anesthesia Type:Spinal  Level of Consciousness: awake, alert , and oriented  Airway & Oxygen Therapy: Patient Spontanous Breathing and Patient connected to face mask oxygen  Post-op Assessment: Report given to RN and Post -op Vital signs reviewed and stable  Post vital signs: Reviewed and stable  Last Vitals:  Vitals Value Taken Time  BP 83/52 08/24/22 1155  Temp 36.6 C 08/24/22 1115  Pulse 85 08/24/22 1157  Resp 12 08/24/22 1157  SpO2 99 % 08/24/22 1157  Vitals shown include unvalidated device data.  Last Pain:  Vitals:   08/24/22 1130  TempSrc:   PainSc: 0-No pain         Complications: No notable events documented.

## 2022-08-24 NOTE — Anesthesia Preprocedure Evaluation (Signed)
Anesthesia Evaluation  Patient identified by MRN, date of birth, ID band Patient awake    Reviewed: Allergy & Precautions, H&P , NPO status , Patient's Chart, lab work & pertinent test results  History of Anesthesia Complications (+) PONV and history of anesthetic complications  Airway Mallampati: III  TM Distance: <3 FB Neck ROM: limited    Dental  (+) Chipped, Poor Dentition, Missing, Edentulous Upper, Upper Dentures   Pulmonary neg shortness of breath, asthma , sleep apnea and Continuous Positive Airway Pressure Ventilation , pneumonia, COPD, neg recent URI          Cardiovascular Exercise Tolerance: Good hypertension, (-) angina + CAD, + Past MI and +CHF  (-) Cardiac Stents + dysrhythmias Atrial Fibrillation + pacemaker + Cardiac Defibrillator (-) Valvular Problems/Murmurs     Neuro/Psych  Headaches, Seizures -,  PSYCHIATRIC DISORDERS Anxiety Depression       GI/Hepatic Neg liver ROS,GERD  Medicated and Controlled,,  Endo/Other  diabetesHypothyroidism    Renal/GU negative Renal ROS  negative genitourinary   Musculoskeletal   Abdominal   Peds  Hematology negative hematology ROS (+)   Anesthesia Other Findings Past Medical History: 2013: Asthma No date: CHF (congestive heart failure) ([place]) 2010: Diverticulitis No date: DVT (deep venous thrombosis) ([place]) 1990: Endometriosis 2013: Heart disease No date: Hx MRSA infection No date: Lump or mass in breast No date: Restless leg  Past Surgical History: No date: ABDOMINAL HYSTERECTOMY     Comment:  age 56 No date: CARDIAC DEFIBRILLATOR PLACEMENT No date: CESAREAN SECTION No date: CHOLECYSTECTOMY No date: COLECTOMY 2012: NASAL SINUS SURGERY  BMI    Body Mass Index:  36.65 kg/m      Reproductive/Obstetrics negative OB ROS                             Anesthesia Physical Anesthesia Plan  ASA: 4  Anesthesia Plan: Spinal    Post-op Pain Management:    Induction: Intravenous  PONV Risk Score and Plan: 3 and Propofol infusion and TIVA  Airway Management Planned: Natural Airway and Nasal Cannula  Additional Equipment:   Intra-op Plan:   Post-operative Plan:   Informed Consent: I have reviewed the patients History and Physical, chart, labs and discussed the procedure including the risks, benefits and alternatives for the proposed anesthesia with the patient or authorized representative who has indicated his/her understanding and acceptance.     Dental Advisory Given  Plan Discussed with: Anesthesiologist, CRNA and Surgeon  Anesthesia Plan Comments: (Patient consented for risks of anesthesia including but not limited to:  - adverse reactions to medications - risk of intubation if required - damage to teeth, lips or other oral mucosa - sore throat or hoarseness - Damage to heart, brain, lungs or loss of life  Patient voiced understanding.)        Anesthesia Quick Evaluation

## 2022-08-24 NOTE — OR Nursing (Signed)
Duraprep, ioban, latex gloves and bupivicaine/exparel used during procedure. Dr. Marry Guan aware of stated/documented allergies prior to administration to the surgical field.    Ladell Heads, RN

## 2022-08-25 ENCOUNTER — Encounter: Payer: Self-pay | Admitting: Orthopedic Surgery

## 2022-08-25 DIAGNOSIS — M1712 Unilateral primary osteoarthritis, left knee: Secondary | ICD-10-CM | POA: Diagnosis not present

## 2022-08-25 MED ORDER — ACETAMINOPHEN 325 MG PO TABS
ORAL_TABLET | ORAL | Status: AC
  Filled 2022-08-25: qty 1

## 2022-08-25 MED ORDER — FUROSEMIDE 20 MG PO TABS
ORAL_TABLET | ORAL | Status: AC
  Filled 2022-08-25: qty 2

## 2022-08-25 MED ORDER — ENOXAPARIN SODIUM 30 MG/0.3ML IJ SOSY
PREFILLED_SYRINGE | INTRAMUSCULAR | Status: AC
  Administered 2022-08-25: 30 mg via SUBCUTANEOUS
  Filled 2022-08-25: qty 0.3

## 2022-08-25 MED ORDER — POTASSIUM CHLORIDE CRYS ER 20 MEQ PO TBCR
EXTENDED_RELEASE_TABLET | ORAL | Status: AC
  Filled 2022-08-25: qty 1

## 2022-08-25 MED ORDER — METOCLOPRAMIDE HCL 10 MG PO TABS
ORAL_TABLET | ORAL | Status: AC
  Filled 2022-08-25: qty 1

## 2022-08-25 MED ORDER — ACETAMINOPHEN 325 MG PO TABS
ORAL_TABLET | ORAL | Status: AC
  Filled 2022-08-25: qty 2

## 2022-08-25 MED ORDER — ENOXAPARIN SODIUM 40 MG/0.4ML IJ SOSY: 40.0000 mg | PREFILLED_SYRINGE | INTRAMUSCULAR | 0 refills | Status: DC

## 2022-08-25 MED ORDER — OXYCODONE HCL 5 MG PO TABS
ORAL_TABLET | ORAL | Status: AC
  Filled 2022-08-25: qty 2

## 2022-08-25 MED ORDER — CELECOXIB 200 MG PO CAPS: 200.0000 mg | ORAL_CAPSULE | Freq: Two times a day (BID) | ORAL | 0 refills | Status: DC

## 2022-08-25 MED ORDER — ACETAMINOPHEN 10 MG/ML IV SOLN
INTRAVENOUS | Status: AC
  Administered 2022-08-25: 1000 mg via INTRAVENOUS
  Filled 2022-08-25: qty 100

## 2022-08-25 NOTE — Evaluation (Signed)
Occupational Therapy Evaluation Patient Details Name: Brittany Nguyen MRN: [id number] DOB: Aug 11, 1966 Today's Date: 08/25/2022   History of Present Illness 56 y/o female s/p L TKA 12/20.   Clinical Impression   Pt seen for OT evaluation this date, POD#1 from above surgery. Pt is alert and oriented x4, agreeable to OT evaluation. PTA pt is indep in ADL/IADL. Pt provided education re: in [facility] care mgt, falls prevention strategies, home/routines modifications, DME/AE for LB bathing and dressing tasks. Pt would benefit from skilled OT services including additional instruction in dressing techniques with or without assistive devices for dressing and bathing skills to support recall and carryover prior to discharge and ultimately to maximize safety, independence, and minimize falls risk and caregiver burden. Do not currently anticipate any OT needs following this hospitalization.        Recommendations for follow up therapy are one component of a multi-disciplinary discharge planning process, led by the attending physician.  Recommendations may be updated based on patient status, additional functional criteria and insurance authorization.   Follow Up Recommendations  No OT follow up     Assistance Recommended at Discharge Set up Supervision/Assistance  Patient can return home with the following Assistance with cooking/housework;Assist for transportation    Functional Status Assessment  Patient has had a recent decline in their functional status and demonstrates the ability to make significant improvements in function in a reasonable and predictable amount of time.  Equipment Recommendations  BSC/3in1    Recommendations for Other Services       Precautions / Restrictions Precautions Precautions: Fall;Knee Restrictions Weight Bearing Restrictions: Yes RLE Weight Bearing: Weight bearing as tolerated      Mobility Bed Mobility               General bed mobility comments: NT  in recliner pre/post session    Transfers Overall transfer level: Needs assistance Equipment used: Rolling walker (2 wheels) Transfers: Sit to/from Stand Sit to Stand: Supervision                  Balance Overall balance assessment: Needs assistance Sitting-balance support: Feet supported Sitting balance-Leahy Scale: Normal     Standing balance support: Bilateral upper extremity supported, During functional activity Standing balance-Leahy Scale: Good                             ADL either performed or assessed with clinical judgement   ADL Overall ADL's : Needs assistance/impaired Eating/Feeding: Set up   Grooming: Standing;Supervision/safety               Lower Body Dressing: Supervision/safety   Toilet Transfer: Supervision/safety;Rolling walker (2 wheels);Ambulation   Toileting- Clothing Manipulation and Hygiene: Supervision/safety;Sit to/from stand       Functional mobility during ADLs: Supervision/safety;Rolling walker (2 wheels) (approx 36' with RW)       Vision Patient Visual Report: No change from baseline       Perception     Praxis      Pertinent Vitals/Pain Pain Assessment Pain Assessment: 0-10 Pain Score: 6  Pain Location: L knee Pain Descriptors / Indicators: Discomfort, Aching Pain Intervention(s): Limited activity within patient's tolerance, Monitored during session, Repositioned, Ice applied     Hand Dominance     Extremity/Trunk Assessment Upper Extremity Assessment Upper Extremity Assessment: Overall WFL for tasks assessed   Lower Extremity Assessment Lower Extremity Assessment: LLE deficits/detail LLE Deficits / Details: s/p L TKA  Communication Communication Communication: No difficulties   Cognition Arousal/Alertness: Awake/alert Behavior During Therapy: WFL for tasks assessed/performed Overall Cognitive Status: Within Functional Limits for tasks assessed                                        General Comments  vital signs monitored throughout, appear stable    Exercises     Shoulder Instructions      Home Living Family/patient expects to be discharged to:: Private residence Living Arrangements: Spouse/significant other Available Help at Discharge: Available 24 hours/day   Home Access: Stairs to enter Entrance Stairs-Number of Steps: 4 Entrance Stairs-Rails: None Home Layout: One level     Bathroom Shower/Tub: Teacher, early years/pre: Standard     Home Equipment: Rollator (4 wheels)          Prior Functioning/Environment Prior Level of Function : Independent/Modified Independent                        OT Problem List: Decreased activity tolerance;Decreased knowledge of use of DME or AE      OT Treatment/Interventions: Self-care/ADL training;Patient/family education;Therapeutic exercise;DME and/or AE instruction;Therapeutic activities    OT Goals(Current goals can be found in the care plan section) Acute Rehab OT Goals Patient Stated Goal: go home OT Goal Formulation: With patient Time For Goal Achievement: 09/07/22 Potential to Achieve Goals: Good ADL Goals Pt Will Perform Grooming: Independently Pt Will Perform Lower Body Dressing: with modified independence;sit to/from stand Pt Will Transfer to Toilet: with modified independence Pt Will Perform Toileting - Clothing Manipulation and hygiene: with modified independence;sit to/from stand  OT Frequency: Min 2X/week    Co-evaluation              AM-PAC OT "6 Clicks" Daily Activity     Outcome Measure Help from another person eating meals?: None Help from another person taking care of personal grooming?: None Help from another person toileting, which includes using toliet, bedpan, or urinal?: None Help from another person bathing (including washing, rinsing, drying)?: A Little Help from another person to put on and taking off regular upper body clothing?:  None Help from another person to put on and taking off regular lower body clothing?: A Little 6 Click Score: 22   End of Session Equipment Utilized During Treatment: Rolling walker (2 wheels)  Activity Tolerance: Patient tolerated treatment well Patient left: in chair;with call bell/phone within reach  OT Visit Diagnosis: Unsteadiness on feet (R26.81)                Time: 8921-1941 OT Time Calculation (min): 8 min Charges:  OT General Charges $OT Visit: 1 Visit OT Evaluation $OT Eval Low Complexity: 1 Low  Shanon Payor, OTD OTR/L  08/25/22, 9:46 AM

## 2022-08-25 NOTE — Progress Notes (Signed)
Physical Therapy Treatment Patient Details Name: Brittany Nguyen MRN: [id number] DOB: 13-Jun-1966 Today's Date: 08/25/2022   History of Present Illness 56 y/o female s/p L TKA 12/20.    PT Comments    Pt was long sitting in bed upon arriving. She was premedicated for pain and reports "very little" throughout session. Rated 3/10. Pt demonstrated safe abilities to exit bed, stand to RW, and ambulate >200 ft. She safely performed ascending/descending stairs with supervision only. Overall pt is progressing well towards all PT goals. Was able to actively bend knee to ~ 86 degrees today. Encouraged continued ROM and strengthening at home. Pt will require RW and BSC prior to [place] home. Recommend HHPT at DC to continue to maximize her independence and safety with all ADLs. Pt is cleared from an acute PT standpoint for safe DC home.   Recommendations for follow up therapy are one component of a multi-disciplinary discharge planning process, led by the attending physician.  Recommendations may be updated based on patient status, additional functional criteria and insurance authorization.  Follow Up Recommendations  Home health PT     Assistance Recommended at Discharge PRN  Patient can return home with the following A little help with walking and/or transfers;A little help with bathing/dressing/bathroom;Assistance with cooking/housework;Assist for transportation;Help with stairs or ramp for entrance   Equipment Recommendations  Rolling walker (2 wheels);BSC/3in1       Precautions / Restrictions Precautions Precautions: Fall Restrictions Weight Bearing Restrictions: Yes RLE Weight Bearing: Weight bearing as tolerated     Mobility  Bed Mobility Overal bed mobility: Modified Independent  General bed mobility comments: no physical assistance or vcing required    Transfers Overall transfer level: Needs assistance Equipment used: Rolling walker (2 wheels) Transfers: Sit to/from  Stand Sit to Stand: Supervision  General transfer comment: no physical assistance or Vcs. Pt was educated on proper technique for getting in/out of her elevated bed at home. she stated understanding and confidence in abilities to perform at DC    Ambulation/Gait Ambulation/Gait assistance: Supervision Gait Distance (Feet): 200 Feet Assistive device: Rolling walker (2 wheels) Gait Pattern/deviations: Step-through pattern, Antalgic Gait velocity: WNL     General Gait Details: No LOB or safety concern with ambulation with RW. Good step through gait pattern observed   Stairs Stairs: Yes Stairs assistance: Supervision Stair Management: No rails, Backwards, With walker Number of Stairs: 4 General stair comments: Easily and safely demonstrated abilities to ascend/descend 4 stair with RW and no rails    Balance Overall balance assessment: Modified Independent     Cognition Arousal/Alertness: Awake/alert Behavior During Therapy: WFL for tasks assessed/performed Overall Cognitive Status: Within Functional Limits for tasks assessed    General Comments: Pt is A and O x 4. Extremely cooperative and pleasant        Exercises Total Joint Exercises Goniometric ROM: ~1-86 degrees    General Comments General comments (skin integrity, edema, etc.): educated on car transfers and expectations going forward from a PT standpoint. Pt states understanding and feels confident in up coming DC home.      Pertinent Vitals/Pain Pain Assessment Pain Assessment: 0-10 Pain Score: 3  Pain Location: L knee, expected increase with exercises/ROM Pain Descriptors / Indicators: Discomfort Pain Intervention(s): Limited activity within patient's tolerance, Monitored during session, Premedicated before session, Repositioned, Ice applied     PT Goals (current goals can now be found in the care plan section) Acute Rehab PT Goals Patient Stated Goal: Go home Progress towards PT goals: Progressing toward  goals    Frequency    BID      PT Plan Discharge plan needs to be updated       AM-PAC PT "6 Clicks" Mobility   Outcome Measure  Help needed turning from your back to your side while in a flat bed without using bedrails?: None Help needed moving from lying on your back to sitting on the side of a flat bed without using bedrails?: None Help needed moving to and from a bed to a chair (including a wheelchair)?: None Help needed standing up from a chair using your arms (e.g., wheelchair or bedside chair)?: A Little Help needed to walk in hospital room?: A Little Help needed climbing 3-5 steps with a railing? : A Little 6 Click Score: 21    End of Session   Activity Tolerance: Patient tolerated treatment well Patient left: in chair;with call bell/phone within reach (PA in room changing dressing) Nurse Communication: Mobility status PT Visit Diagnosis: Muscle weakness (generalized) (M62.81);Difficulty in walking, not elsewhere classified (R26.2);Pain Pain - Right/Left: Left Pain - part of body: Knee     Time: 4967-5916 PT Time Calculation (min) (ACUTE ONLY): 24 min  Charges:  $Gait Training: 8-22 mins $Therapeutic Exercise: 8-22 mins                     Julaine Fusi PTA 08/25/22, 8:26 AM

## 2022-08-25 NOTE — Discharge Summary (Addendum)
Physician Discharge Summary  Patient ID: Brittany Nguyen MRN: [id number] DOB/AGE: 1966-05-14 56 y.o.  Admit date: 08/24/2022 Discharge date: 08/25/2022  Admission Diagnoses:  Total knee replacement status [Z96.659]  Surgeries:Procedure(s):   Left total knee arthroplasty using computer-assisted navigation   SURGEON:  Marciano Sequin. M.D.   ASSISTANT: Cassell Smiles, PA-C (present and scrubbed throughout the case, critical for assistance with exposure, retraction, instrumentation, and closure)   ANESTHESIA: spinal and general   ESTIMATED BLOOD LOSS: 50 mL   FLUIDS REPLACED: 1400 mL of crystalloid   TOURNIQUET TIME: 71 minutes   DRAINS: 2 medium Hemovac drains   SOFT TISSUE RELEASES: Anterior cruciate ligament, posterior cruciate ligament, deep medial collateral ligament, patellofemoral ligament   IMPLANTS UTILIZED: DePuy Attune size 4N posterior stabilized femoral component (cemented), size 4 rotating platform tibial component (cemented), 35 mm medialized dome patella (cemented), and a 5 mm stabilized rotating platform polyethylene insert.  Discharge Diagnoses: Patient Active Problem List   Diagnosis Date Noted   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   CHF (congestive heart failure) ([place]) 08/17/2022   History of DVT (deep vein thrombosis) 08/17/2022   MRSA (methicillin resistant Staphylococcus aureus) 08/17/2022   Hypocalcemia 07/31/2022   Diarrhea 07/31/2022   Essential hypertension 07/31/2022   Morbid obesity with body mass index (BMI) of 50.0 to 59.9 in adult ([place]) 07/31/2022   Seizures ([place]) 07/30/2022   Breast cancer ([place]) 05/23/2022   Bloody stool 04/22/2022   Seizure ([place]) 03/01/2022   Refractory nausea and vomiting 12/15/2021   Migraine 11/09/2021   Neuropathy 11/09/2021   Anxiety 11/09/2021   At risk for prolonged QT interval syndrome - borderline long QT on EKG 11/07/21 in ED 11/07/2021   Prolonged QT interval 09/27/2021   Cancer related pain  08/10/2021   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   Seizure disorder ([place]) 06/22/2021   Chronic diarrhea 06/22/2021   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   Moderate protein-calorie malnutrition ([place]) 06/19/2020   Non-ischemic cardiomyopathy ([place]) 01/23/2020   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/23/2020   Breakthrough seizure ([place]) 01/23/2020   Acquired hypothyroidism 01/23/2020   Chronic use of opiate for therapeutic purpose 01/23/2020   Type 2 diabetes mellitus without complication ([place]) 80/99/8338   Current moderate episode of major depressive disorder ([place]) 05/21/2019   Viral syndrome 04/24/2019   DVT (deep venous thrombosis) ([place]) 02/27/2019   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[place]) 10/19/2018   Coronary artery disease involving native coronary artery of native heart 08/24/2018   Mild intermittent asthma without complication 25/01/3975   Breast mass 08/08/2018   Intractable migraine with aura without status migrainosus 06/20/2018   Osteoarthritis of knee 06/01/2018   Pain in right knee 05/15/2018   Abdominal wall pain in right flank 04/10/2018   Postcholecystectomy diarrhea 04/10/2018   Iron deficiency anemia 03/27/2018   Unstable angina (HCC)    Encounter for anticoagulation discussion and counseling    COPD (chronic obstructive pulmonary disease) ([place]) 04/26/2017   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 02/03/2017   Hypotension 02/03/2017   RLS (restless legs syndrome) 12/09/2016   Hypokalemia 11/22/2015   OSA on CPAP 04/17/2014   Hx MRSA infection    Lump or mass in breast    Asthma 2013   GOITER, MULTINODULAR 01/20/2009   HYPOGLYCEMIA, UNSPECIFIED 01/20/2009   GERD 01/20/2009   DIVERTICULITIS OF COLON 01/20/2009   Diverticulitis of colon 01/20/2009    Past Medical  History:  Diagnosis Date   Acute anterolateral wall MI ([place])  08/2016   AICD (automatic cardioverter/defibrillator) present 03/29/2017   a.) Medtronic Evera MRI XT DR SureScan (SN: [id number])   Anxiety    Arthritis    Asthma 2013   C. 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[place])    a.) on lacosamide; b.) last seizure 07-30-22 in setting of hypokalemia (K+ 2.6)   GERD (gastroesophageal reflux disease)    GIB (gastrointestinal bleeding) 01/08/2018   HFrEF (heart failure with reduced ejection fraction) ([place])    a.)TTE 12/09/16: EF 25-30%, dif HK; b.) TTE 02/08/17: EF 25%, dif HK, mild TR, G1DD; c.) TTE 04/25/17: EF 30-35%, mild LVH; d.) TTE 01/30/18: EF 45%, MAC, LAE, mild MR, G1DD; e.) TTE 11/06/2018: EF 25%, sev glob HK, triv MR/TR/PR; f.) TTE 05/12/19: EF 35%, MAC, AoV sclerosis, G1DD; g.) TTE 11/05/19: EF 20%, sev glob HK, triv MR; h.) TTE 06/17/20: EF 40%, mod glob HK, triv MR; I:) TTE 06/14/22: EF 25-30%, G1DD   History of cardiac catheterization    a.) LHC 10/21/2014: normal coronaries; b.) R/LHC 02/24/2017: normal coronaries, mRA 12, mPA 28, mPCWP 15, CO 8.0, CI 3.8   History of kidney stones    Hx MRSA infection    Hypokalemia    Hypothyroidism    Iron deficiency anemia 03/27/2018   Lump or mass in breast 11/27/2012   a.) Bx 11/27/2012 --> apocrine wall cyst   Migraine    Nausea & vomiting    Neuropathy    Non-ischemic cardiomyopathy ([place])    a.) TTE 12/09/2016: EF 25-30%, b.) TTE 02/08/2017: EF 25%; c.) Medtronic AICD placed 03/29/2017; d.)  TTE 04/25/2017: EF 30-35%; e.) TTE 01/30/2018: EF 45%; f.) TTE 11/06/2018: EF 25%; g.) TTE 05/12/2019: EF 35%; h.) TTE 11/05/2019: EF 20%; I.) TTE 06/17/2020: EF 40%; J.) TTE 06/14/2022: EF 25-30%   Obesity    OSA on CPAP    PAF (paroxysmal atrial fibrillation) (HCC)    PONV (postoperative nausea and vomiting)    Post-COVID chronic cough    Postcholecystectomy diarrhea    Prolonged Q-T interval on ECG    Restless leg    a.) on ropinirole   Unstable angina ([place])      Transfusion:    Consultants (if any):   Discharged Condition: Improved  Hospital Course: Brittany Nguyen is an 56 y.o. female who was admitted 08/24/2022 with a diagnosis of left knee osteoarthritis and went to the operating room on 08/24/2022 and underwent left total knee arthoplasty. The patient received perioperative antibiotics for prophylaxis (see below). The patient tolerated the procedure well and was transported to PACU in stable condition. After meeting PACU criteria, the patient was subsequently transferred to the Orthopaedics/Rehabilitation unit.   The patient received DVT prophylaxis in the form of early mobilization, Lovenox, TED hose, and SCDs . A sacral pad had been placed and heels were elevated off of the bed with rolled towels in order to protect skin  integrity. Foley catheter was discontinued on postoperative day #0. Wound drains were discontinued on postoperative day #1. The surgical incision was healing well without signs of infection.  Physical therapy was initiated postoperatively for transfers, gait training, and strengthening. Occupational therapy was initiated for activities of daily living and evaluation for assisted devices. Rehabilitation goals were reviewed in detail with the patient. The patient made steady progress with physical therapy and physical therapy recommended discharge to Home.   The patient achieved the preliminary goals of this hospitalization and was felt to be medically and  orthopaedically appropriate for discharge.  She was given perioperative antibiotics:  Anti-infectives (From admission, onward)    Start     Dose/Rate Route Frequency Ordered Stop   08/24/22 1430  ceFAZolin (ANCEF) IVPB 2g/100 mL premix        2 g 200 mL/hr over 30 Minutes Intravenous Every 6 hours 08/24/22 1426 08/24/22 2305   08/24/22 0633  ceFAZolin (ANCEF) 2-4 GM/100ML-% IVPB       Note to Pharmacy: Olena Mater F: cabinet override      08/24/22 0633 08/24/22 1600   08/24/22 0600  ceFAZolin (ANCEF) IVPB 2g/100 mL premix        2 g 200 mL/hr over 30 Minutes Intravenous On call to O.R. 08/23/22 2210 08/24/22 1658     .  Recent vital signs:  Vitals:   08/25/22 0746 08/25/22 0751  BP: 115/73 112/65  Pulse:  67  Resp: 18 17  Temp:  (!) 97.3 F (36.3 C)  SpO2:  98%    Recent laboratory studies:  Recent Labs    08/24/22 0624  HGB 14.6  HCT 43.0  K 3.5  CL 100  BUN 14  CREATININE 0.90  GLUCOSE 184*    Diagnostic Studies: DG Knee Left Port  Result Date: 08/24/2022 CLINICAL DATA:  Total knee replacement EXAM: PORTABLE LEFT KNEE - 1-2 VIEW COMPARISON:  06/26/2022 FINDINGS: Postsurgical changes of left total knee arthroplasty. Normal alignment without evidence of loosening or fracture. Expected soft tissue changes. A drain is in place. IMPRESSION: Postsurgical changes of left total knee arthroplasty. No evidence of immediate complication. Electronically Signed   By: Maurine Simmering M.D.   On: 08/24/2022 11:37   PERIPHERAL VASCULAR CATHETERIZATION  Result Date: 08/23/2022 See surgical note for result.  EEG adult  Result Date: 08/01/2022 Lora Havens, MD     08/01/2022  1:10 PM Patient Name: Brittany Nguyen MRN: [id number] Epilepsy Attending: Lora Havens Referring Physician/Provider: Bernadette Hoit, DO Date: 08/01/2022 Duration: 29.28 mins Patient history: 56 year old female with seizure.  EEG to evaluate for seizure. Level of alertness: Awake, asleep AEDs  during EEG study: LEV, LCM, GBP Technical aspects: This EEG study was done with scalp electrodes positioned according to the 10-20 International system of electrode placement. Electrical activity was reviewed with band pass filter of 1-70Hz, sensitivity of 7 uV/mm, display speed of 67m/sec with a 60Hz notched filter applied as appropriate. EEG data were recorded continuously and digitally stored.  Video monitoring was available and reviewed as appropriate. Description: The posterior dominant rhythm consists of 8 Hz activity of moderate voltage (25-35 uV) seen predominantly in posterior head regions, symmetric and reactive to eye opening and eye closing. Sleep was characterized by vertex waves, sleep spindles (12 to 14 Hz), maximal frontocentral region. Physiologic photic driving was not seen during photic stimulation. Hyperventilation was not performed.   IMPRESSION: This study is within normal limits. No seizures or epileptiform discharges were seen throughout the  recording. A normal interictal EEG does not exclude nor support the diagnosis of epilepsy. Lora Havens   CT Head Wo Contrast  Result Date: 07/30/2022 CLINICAL DATA:  seizure EXAM: CT HEAD WITHOUT CONTRAST TECHNIQUE: Contiguous axial images were obtained from the base of the skull through the vertex without intravenous contrast. RADIATION DOSE REDUCTION: This exam was performed according to the departmental dose-optimization program which includes automated exposure control, adjustment of the mA and/or kV according to patient size and/or use of iterative reconstruction technique. COMPARISON:  CT head 03/01/2022 FINDINGS: Brain: No evidence of large-territorial acute infarction. No parenchymal hemorrhage. No mass lesion. No extra-axial collection. No mass effect or midline shift. No hydrocephalus. Basilar cisterns are patent. Vascular: No hyperdense vessel. Skull: No acute fracture or focal lesion. Sinuses/Orbits: Left sphenoid sinus mucosal  thickening. Otherwise paranasal sinuses and mastoid air cells are clear. The orbits are unremarkable. Other: None. IMPRESSION: No acute intracranial abnormality. Electronically Signed   By: Iven Finn M.D.   On: 07/30/2022 22:00    Discharge Medications:   Allergies as of 08/25/2022       Reactions   Contrast Media [iodinated Contrast Media] Shortness Of Breath   Per CT report in 2014 pt had break through contrast reaction with hives and SOB following 13 hour prep. MSY    Ferumoxytol Nausea Only   Iron Sucrose Anaphylaxis   Lidocaine Hives   Metrizamide Shortness Of Breath   Penicillins Hives, Other (See Comments)   IgE = 4 (WNL) on 08/12/2022 ediate rash, facial/tongue/throat swelling, SOB or lightheadedness with hypotension, tachy   Sacubitril-valsartan    Sz (unclear if new start entresto vs hypoglycemia)   Isosorbide Nitrate Other (See Comments)   Headache   Latex Hives   Ondansetron Other (See Comments)   Severe headache   Tizanidine Other (See Comments)   Feels altered   Povidone-iodine Rash   Blistering rash   Pulmicort [budesonide] Itching        Medication List     TAKE these medications    albuterol 108 (90 Base) MCG/ACT inhaler Commonly known as: VENTOLIN HFA Inhale 1-2 puffs into the lungs every 6 (six) hours as needed for wheezing or shortness of breath.   albuterol 1.25 MG/3ML nebulizer solution Commonly known as: ACCUNEB Take 1 ampule by nebulization every 6 (six) hours as needed for wheezing.   enoxaparin 40 MG/0.4ML injection Commonly known as: LOVENOX Inject 0.4 mLs (40 mg total) into the skin daily for 14 days.   escitalopram 10 MG tablet Commonly known as: LEXAPRO Take 10 mg by mouth every morning.   fentaNYL 75 MCG/HR Commonly known as: [place] 1 patch onto the skin every 3 (three) days.   furosemide 40 MG tablet Commonly known as: LASIX Take 40 mg by mouth 2 (two) times daily.   gabapentin 100 MG capsule Commonly known as:  NEURONTIN Take 100 mg by mouth at bedtime.   Lacosamide 100 MG Tabs Take 1 tablet by mouth 2 (two) times daily.   lanreotide acetate 120 MG/0.5ML injection Commonly known as: SOMATULINE DEPOT Inject 120 mg into the skin every 28 (twenty-eight) days.   levothyroxine 175 MCG tablet Commonly known as: SYNTHROID Take 175 mcg by mouth every morning.   LORazepam 1 MG tablet Commonly known as: ATIVAN Take 1 tablet (1 mg total) by mouth 4 (four) times daily as needed.   losartan 25 MG tablet Commonly known as: COZAAR Take 12.5 mg by mouth 2 (two) times daily.   metoprolol succinate  50 MG 24 hr tablet Commonly known as: TOPROL-XL Take 50 mg by mouth 2 (two) times daily.   morphine 15 MG tablet Commonly known as: MSIR Take 15 mg by mouth every 4 (four) hours as needed.   naloxone 4 MG/0.1ML Liqd nasal spray kit Commonly known as: NARCAN Place 1 spray into the nose as needed for opioid reversal. May repeat once in opposite nare.   nitroGLYCERIN 0.4 MG SL tablet Commonly known as: NITROSTAT Place 0.4 mg under the tongue every 5 (five) minutes x 3 doses as needed for chest pain.   oxyCODONE-acetaminophen 5-325 MG tablet Commonly known as: PERCOCET/ROXICET Take 1-2 tablets by mouth every 4 (four) hours as needed (breakthrough pain).   Potassium Chloride ER 20 MEQ Tbcr Take 1 tablet by mouth in the morning and at bedtime.   promethazine 25 MG tablet Commonly known as: PHENERGAN Take 25 mg by mouth every 4 (four) hours as needed for nausea/vomiting.   rOPINIRole 4 MG tablet Commonly known as: REQUIP Take 4 mg by mouth at bedtime.               Durable Medical Equipment  (From admission, onward)           Start     Ordered   08/24/22 1614  DME Walker rolling  Once       Question:  Patient needs a walker to treat with the following condition  Answer:  Total knee replacement status   08/24/22 1613   08/24/22 1614  DME Bedside commode  Once       Comments: Patient  is not able to walk the distance required to go the bathroom, or he/she is unable to safely negotiate stairs required to access the bathroom.  A 3in1 BSC will alleviate this problem  Question:  Patient needs a bedside commode to treat with the following condition  Answer:  Total knee replacement status   08/24/22 1613            Disposition: Home with home health PT     Follow-up Information     Watt Climes, PA Follow up on 09/07/2022.   Specialty: Physician Assistant Why: at 9:45am Contact information: [place] [address] [phone]         Dereck Leep, MD Follow up on 10/06/2022.   Specialty: Orthopedic Surgery Why: at 9:15am Contact information: [place] [id number] [place], PA-C 08/25/2022, 8:16 AM

## 2022-08-25 NOTE — Progress Notes (Signed)
  Subjective: 1 Day Post-Op Procedure(s) (LRB): COMPUTER ASSISTED TOTAL KNEE ARTHROPLASTY (Left) Patient reports pain as well-controlled.   Patient is well, and has had no acute complaints or problems Plan is to go Home after hospital stay. Negative for chest pain and shortness of breath Fever: no Gastrointestinal: negative for nausea and vomiting.   Patient has not had a bowel movement.  Objective: Vital signs in last 24 hours: Temp:  [97.2 F (36.2 C)-98 F (36.7 C)] 97.3 F (36.3 C) (12/21 0751) Pulse Rate:  [67-97] 67 (12/21 0751) Resp:  [11-19] 17 (12/21 0751) BP: (80-115)/(43-73) 112/65 (12/21 0751) SpO2:  [96 %-99 %] 98 % (12/21 0751)  Intake/Output from previous day:  Intake/Output Summary (Last 24 hours) at 08/25/2022 0814 Last data filed at 08/25/2022 0630 Gross per 24 hour  Intake 2570 ml  Output 1795 ml  Net 775 ml    Intake/Output this shift: No intake/output data recorded.  Labs: Recent Labs    08/24/22 0624  HGB 14.6   Recent Labs    08/24/22 0624  HCT 43.0   Recent Labs    08/24/22 0624  NA 139  K 3.5  CL 100  BUN 14  CREATININE 0.90  GLUCOSE 184*   No results for input(s): "LABPT", "INR" in the last 72 hours.   EXAM General - Patient is Alert, Appropriate, and Oriented Extremity - Neurovascular intact Dorsiflexion/Plantar flexion intact Compartment soft Dressing/Incision -Postoperative dressing remains in place., Polar Care in place and working. , Hemovac in place. , Following removal of post-op dressing, minimal drainage noted Motor Function - intact, moving foot and toes well on exam. Able to perform independent SLR.  Cardiovascular- Regular rate and rhythm, no murmurs/rubs/gallops Respiratory- Lungs clear to auscultation bilaterally Gastrointestinal- soft, nontender, and active bowel sounds   Assessment/Plan: 1 Day Post-Op Procedure(s) (LRB): COMPUTER ASSISTED TOTAL KNEE ARTHROPLASTY (Left) Principal Problem:   Total knee  replacement status  Estimated body mass index is 32.95 kg/m as calculated from the following:   Height as of this encounter: '5\' 5"'$  (1.651 m).   Weight as of this encounter: 89.8 kg. Advance diet Up with therapy  Patient has already completed all therapy goals in her AM session. Orderse placed for d/c.  Post-op dressing removed. , Hemovac removed., and Mini compression dressing applied.   DVT Prophylaxis - Lovenox, Ted hose, and SCDs Weight-Bearing as tolerated to left leg  Cassell Smiles, PA-C [facility] 08/25/2022, 8:14 AM

## 2022-08-25 NOTE — Anesthesia Postprocedure Evaluation (Signed)
Anesthesia Post Note  Patient: Brittany Nguyen  Procedure(s) Performed: COMPUTER ASSISTED TOTAL KNEE ARTHROPLASTY (Left: Knee)  Patient location during evaluation: Short Stay Anesthesia Type: Spinal and General Level of consciousness: oriented and awake and alert Pain management: pain level controlled Vital Signs Assessment: post-procedure vital signs reviewed and stable Respiratory status: spontaneous breathing, respiratory function stable and patient connected to nasal cannula oxygen Cardiovascular status: blood pressure returned to baseline and stable Postop Assessment: no headache, no backache and no apparent nausea or vomiting Anesthetic complications: no  No notable events documented.   Last Vitals:  Vitals:   08/25/22 0746 08/25/22 0751  BP: 115/73 112/65  Pulse:  67  Resp: 18 17  Temp:  (!) 36.3 C  SpO2:  98%    Last Pain:  Vitals:   08/25/22 0751  TempSrc: Temporal  PainSc:                  [name]

## 2022-08-25 NOTE — TOC Initial Note (Addendum)
Transition of Care [facility]) - Initial/Assessment Note    Patient Details  Name: Brittany Nguyen MRN: [id number] Date of Birth: 10/11/1965  Transition of Care [facility]) CM/SW Contact:    Conception Oms, RN Phone Number: 08/25/2022, 9:14 AM  Clinical Narrative:                 The patient lives at home with Spouse  She will get a RW and 3 in 1 delivered by [facility], she requested this to be delivered to her home, I Notified [employer] She is set up with [facility] prior to surgery by surgeons office  Planned Disposition: Home with Health Care Svc Barriers to Discharge: Barriers Resolved   Patient Goals and CMS Choice            Expected Discharge Plan and Services Planned Disposition: Home with Health Care Svc   Discharge Planning Services: CM Consult   Living arrangements for the past 2 months: [place] Expected Discharge Date: 08/25/22               DME Arranged: Gilford Rile rolling, 3-N-1 DME Agency: [employer] Date DME Agency Contacted: 08/25/22 Time DME Agency Contacted: [phone] Representative spoke with at DME Agency: [employer]ari Sportsman [place] Arranged: PT [place] Agency: [place] Date [place]: 08/25/22 Time [place]: 1751 Representative spoke with at [place]: [place]  Prior Living Arrangements/Services Living arrangements for the past 2 months: [place] with:: Spouse Patient language and need for interpreter reviewed:: Yes Do you feel safe going back to the place where you live?: Yes      Need for Family Participation in Patient Care: Yes (Comment) Care giver support system in place?: Yes (comment) Current home services: DME (rollator) Criminal Activity/Legal Involvement Pertinent to Current Situation/Hospitalization: No - Comment as needed  Activities of Daily Living Home Assistive Devices/Equipment: None (waiting for cpap) ADL Screening (condition at time of admission) Patient's cognitive ability  adequate to safely complete daily activities?: Yes Is the patient deaf or have difficulty hearing?: No Does the patient have difficulty seeing, even when wearing glasses/contacts?: No Does the patient have difficulty concentrating, remembering, or making decisions?: No Patient able to express need for assistance with ADLs?: Yes Does the patient have difficulty dressing or bathing?: No Independently performs ADLs?: Yes (appropriate for developmental age) Does the patient have difficulty walking or climbing stairs?: No Weakness of Legs: Left (knee pain) Weakness of Arms/Hands: None  Permission Sought/Granted   Permission granted to share information with : Yes, Verbal Permission Granted              Emotional Assessment Appearance:: Appears stated age Attitude/Demeanor/Rapport: Engaged Affect (typically observed): Pleasant Orientation: : Oriented to Self, Oriented to Place, Oriented to  Time, Oriented to Situation Alcohol / Substance Use: Not Applicable Psych Involvement: No (comment)  Admission diagnosis:  Total knee replacement status [Z96.659] Patient Active Problem List   Diagnosis Date Noted   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   CHF (congestive heart failure) ([place]) 08/17/2022   History of DVT (deep vein thrombosis) 08/17/2022   MRSA (methicillin resistant Staphylococcus aureus) 08/17/2022   Hypocalcemia 07/31/2022   Diarrhea 07/31/2022   Essential hypertension 07/31/2022   Morbid obesity with body mass index (BMI) of 50.0 to 59.9 in adult ([place]) 07/31/2022   Seizures ([place]) 07/30/2022   Breast cancer ([place]) 05/23/2022   Bloody stool 04/22/2022   Seizure ([place]) 03/01/2022   Refractory nausea and vomiting 12/15/2021  Migraine 11/09/2021   Neuropathy 11/09/2021   Anxiety 11/09/2021   At risk for prolonged QT interval syndrome - borderline long QT on EKG 11/07/21 in ED 11/07/2021   Prolonged QT interval 09/27/2021   Cancer related pain 08/10/2021   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   Seizure disorder ([place]) 06/22/2021   Chronic diarrhea 06/22/2021   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   Moderate protein-calorie malnutrition ([place]) 06/19/2020   Non-ischemic cardiomyopathy ([place]) 01/23/2020   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/23/2020   Breakthrough seizure ([place]) 01/23/2020   Acquired hypothyroidism 01/23/2020   Chronic use of opiate for therapeutic purpose 01/23/2020   Type 2 diabetes mellitus without complication ([place]) 09/32/6712   Current moderate episode of major depressive disorder ([place]) 05/21/2019   Viral syndrome 04/24/2019   DVT (deep venous thrombosis) ([place]) 02/27/2019   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   Coronary artery disease involving native coronary artery of native heart 08/24/2018   Mild intermittent asthma without complication 45/80/9983   Breast mass 08/08/2018   Intractable migraine with aura without status migrainosus 06/20/2018   Osteoarthritis of knee 06/01/2018   Pain in right knee 05/15/2018   Abdominal wall pain in right flank 04/10/2018   Postcholecystectomy diarrhea 04/10/2018   Iron deficiency anemia 03/27/2018   Unstable angina (HCC)    Encounter for anticoagulation discussion and counseling    COPD (chronic obstructive pulmonary disease) ([place]) 04/26/2017   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 [place] [address] Phone: [phone] Fax: [phone]     Social Determinants of Health (SDOH) Social History: SDOH Screenings   Food Insecurity: No Food Insecurity (08/24/2022)  Housing: Low Risk  (08/24/2022)  Transportation Needs: No Transportation Needs (08/24/2022)  Utilities: Not At Risk (08/24/2022)  Recent Concern: Utilities - At Risk (07/31/2022)  Tobacco Use: Low Risk  (08/24/2022)   SDOH Interventions:     Readmission Risk Interventions     No data to display

## 2022-09-06 IMAGING — DX DG CHEST 1V PORT
1 series · 1 of 1 positions shown · non-contrast
Comparison: 12/06/2020

CLINICAL DATA: Seizure

EXAM:
PORTABLE CHEST 1 VIEW

[chest ap]
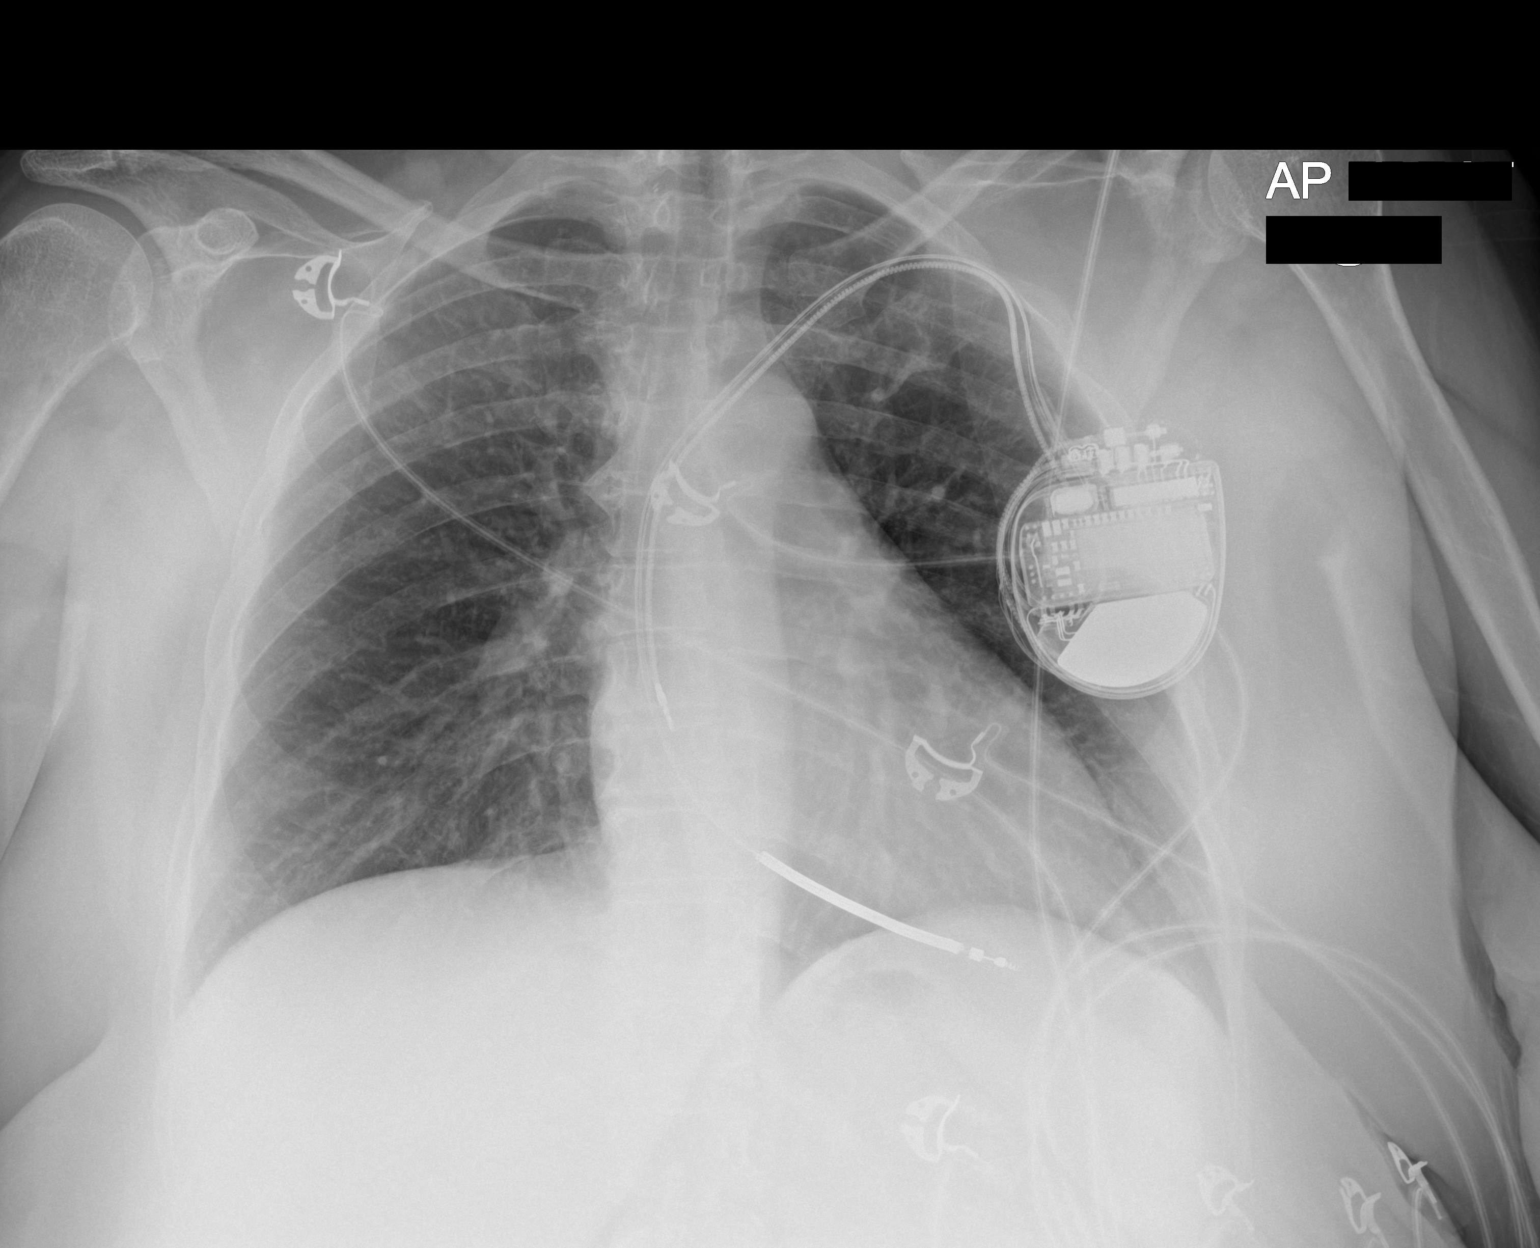

[1 of 1 positions shown; findings below may reference images not displayed]

FINDINGS: Left chest cardiac device with leads in the right atrium and
ventricle, unchanged. Unchanged cardiac and mediastinal contours. No
focal pulmonary opacity. No pleural effusion or pneumothorax. No
acute osseous abnormality.
IMPRESSION: No active disease.

## 2022-09-07 IMAGING — CT CT ABD-PELV W/O CM
2 of 4 series · 16 of 46 positions shown, 18 images · non-contrast
Comparison: 02/26/2021

CLINICAL DATA: Seizure, nausea/vomiting

EXAM:
CT ABDOMEN AND PELVIS WITHOUT CONTRAST
TECHNIQUE: Multidetector CT imaging of the abdomen and pelvis was performed
following the standard protocol without IV contrast.

[Series 2: routine abd/pel wo · axial · 0.95mm/px · z∈[-852,-432]mm · 13 of 92 slices shown, 15 images]
[im 4/92  soft-tissue]
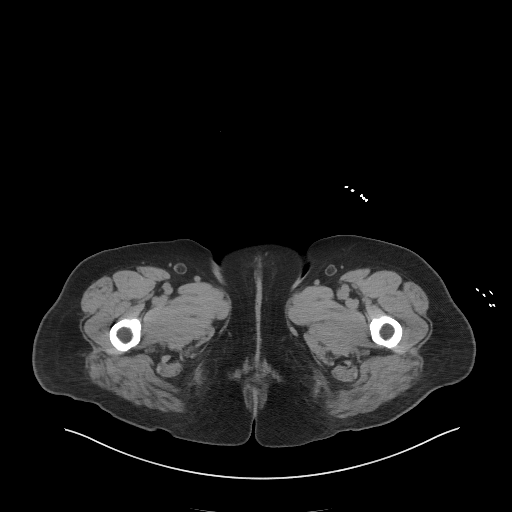
[im 4/92  bone]
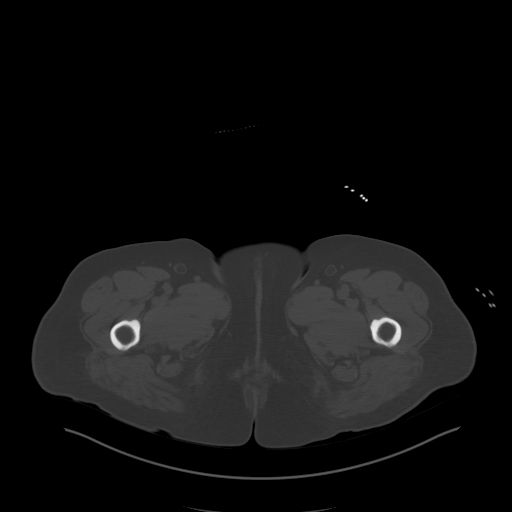
[im 12/92  soft-tissue]
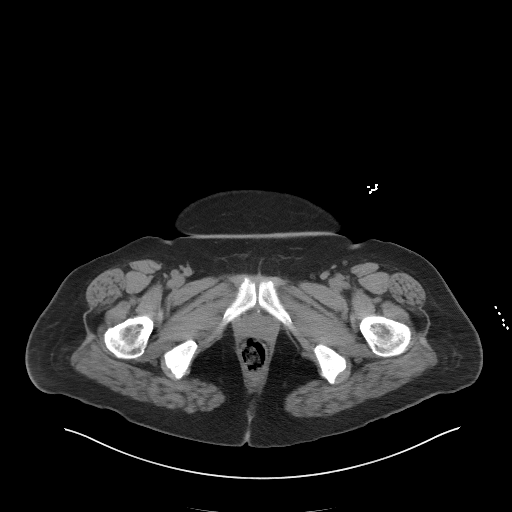
[im 20/92  soft-tissue]
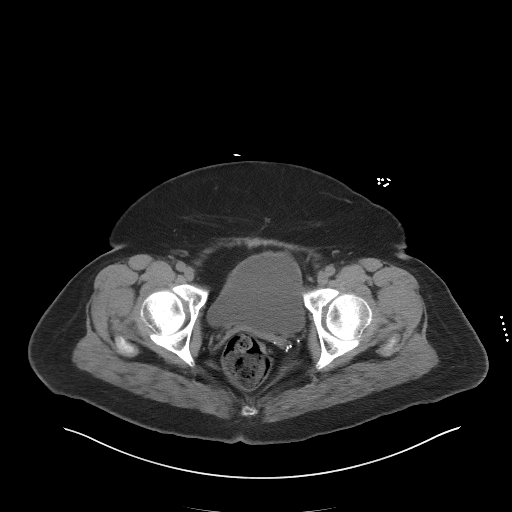
[im 24/92  soft-tissue]
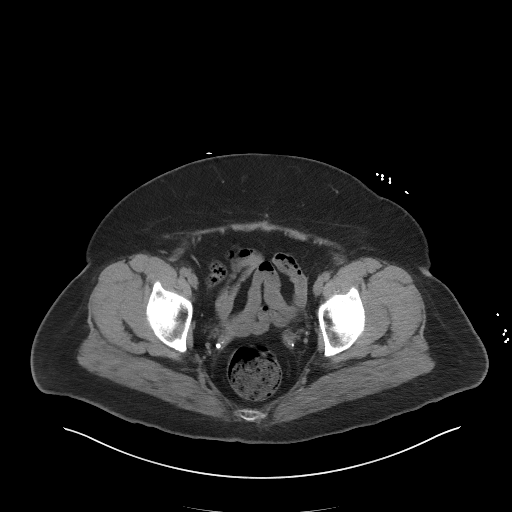
[im 32/92  soft-tissue]
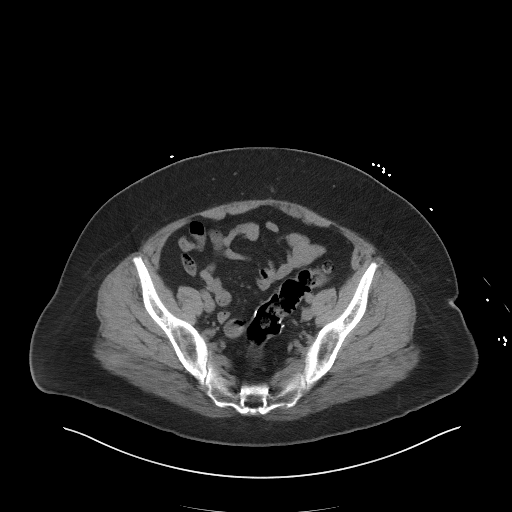
[im 40/92  soft-tissue]
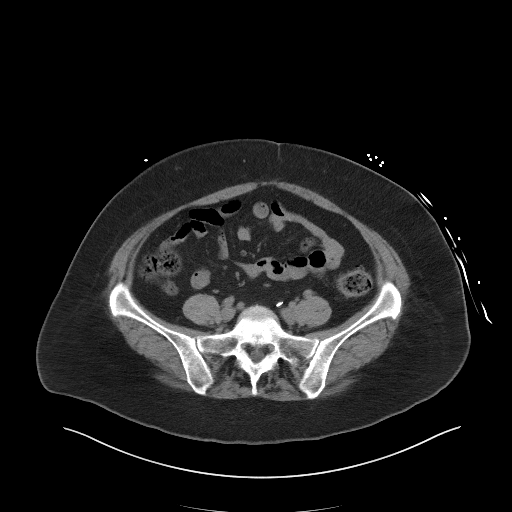
[im 48/92  soft-tissue]
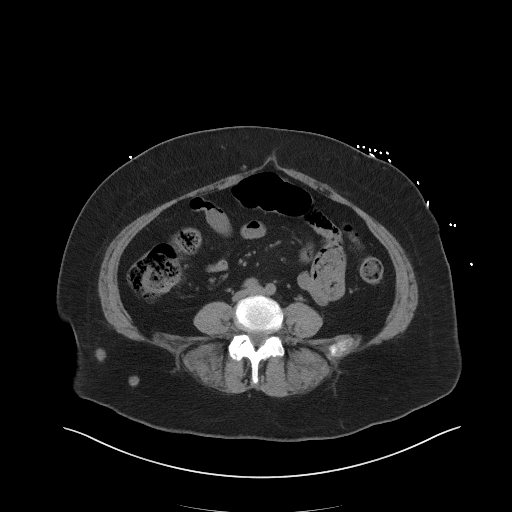
[im 52/92  soft-tissue]
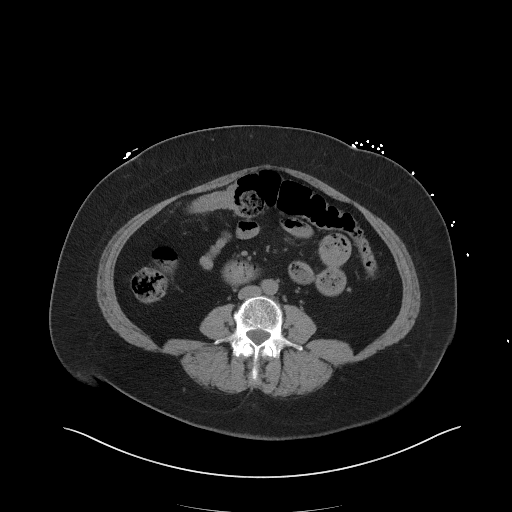
[im 60/92  soft-tissue]
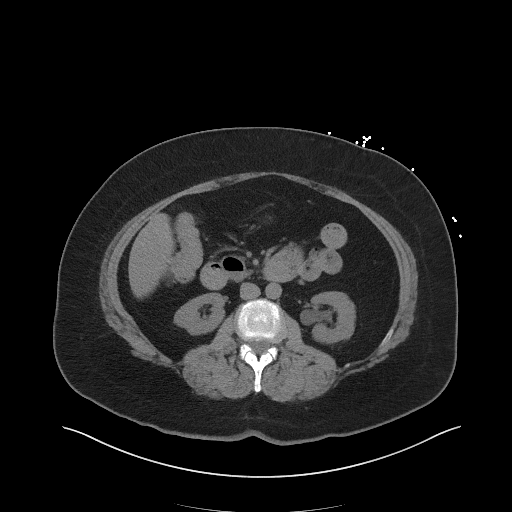
[im 60/92  bone]
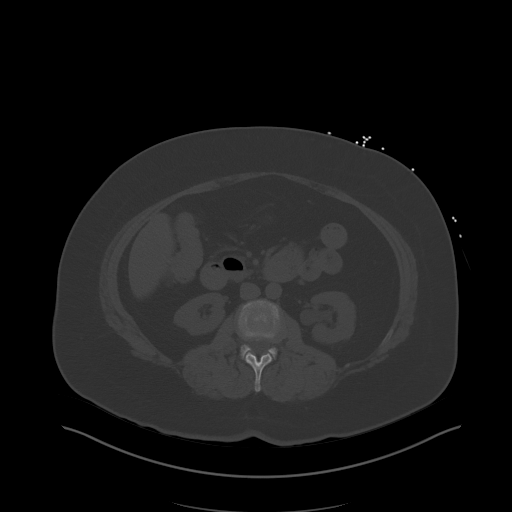
[im 68/92  soft-tissue]
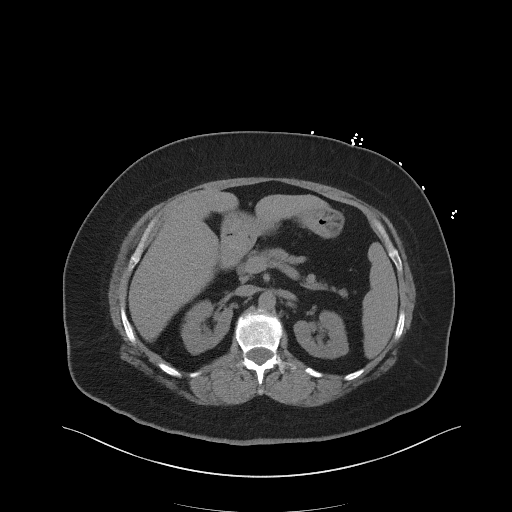
[im 72/92  soft-tissue]
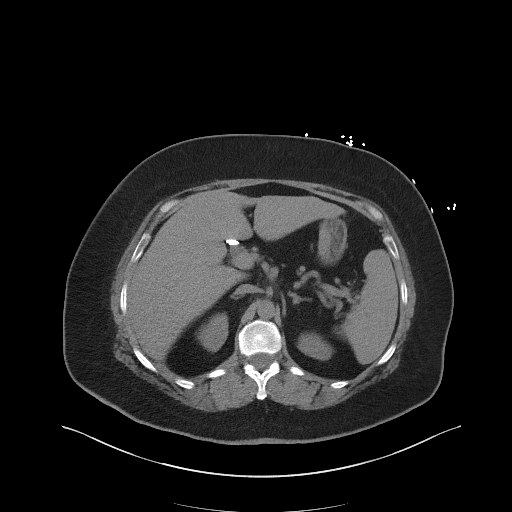
[im 80/92  soft-tissue]
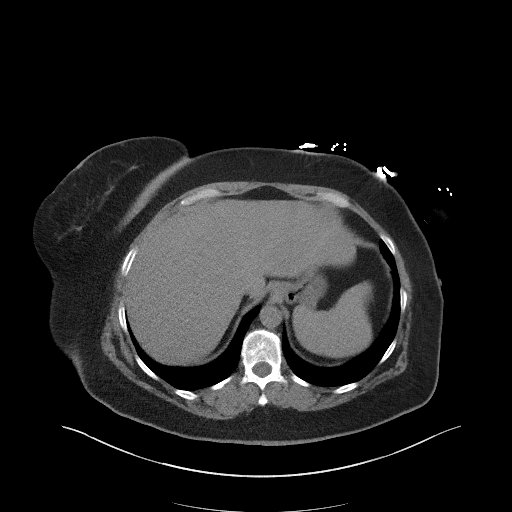
[im 88/92  soft-tissue]
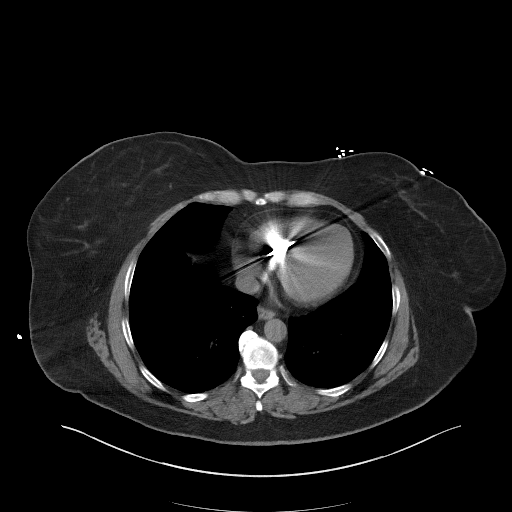

[Series 5: coronal st · coronal · 0.71mm/px · 3 of 101 slices shown]
[im 34/101  soft-tissue]
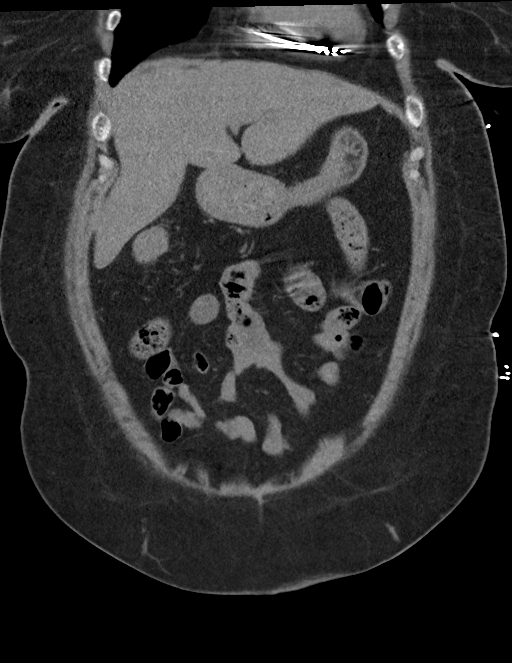
[im 45/101  soft-tissue]
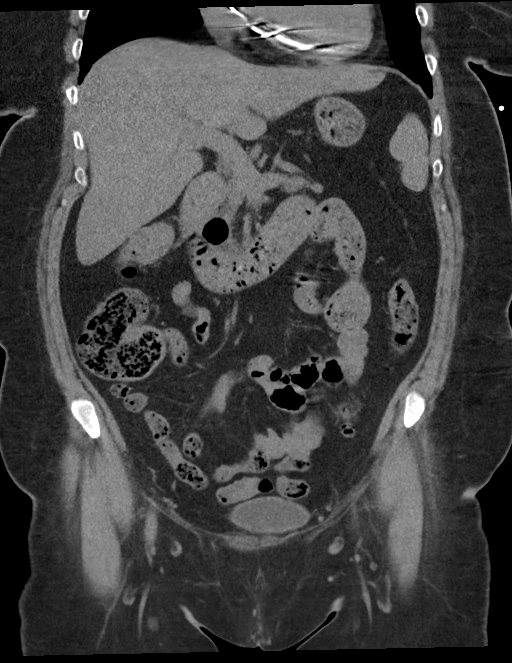
[im 56/101  soft-tissue]
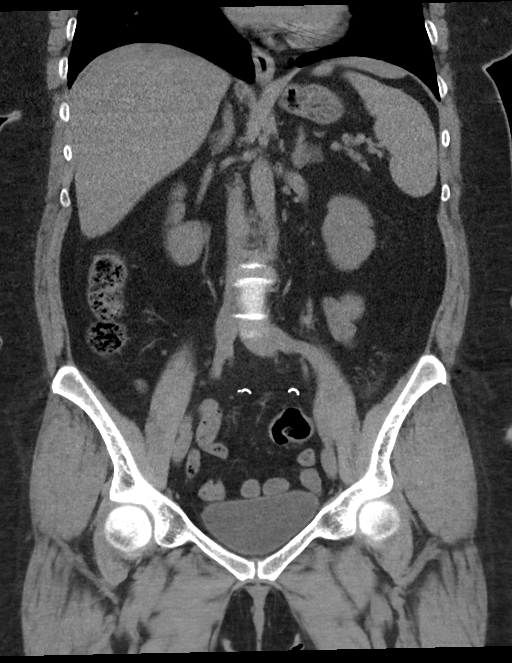

[16 of 46 positions shown; findings below may reference images not displayed]

FINDINGS: Lower chest: Lung bases are clear.

Hepatobiliary: Unenhanced liver is unremarkable.

Status post cholecystectomy. No intrahepatic or extrahepatic ductal
dilatation.

Pancreas: Mild parenchymal atrophy.

Spleen: Within normal limits.

Adrenals/Urinary Tract: Adrenal glands are within normal limits.

Kidneys are within normal limits. No renal calculi or
hydronephrosis.

Bladder is within normal limits.

Stomach/Bowel: Stomach is within normal limits.

No evidence of bowel obstruction.

Appendix is not discretely visualized.

Status post left hemicolectomy with suture line in the lower pelvis
(series 2/image 62).

No colonic wall thickening or inflammatory changes.

Vascular/Lymphatic: No evidence of abdominal aortic aneurysm.

No suspicious abdominopelvic lymphadenopathy.

Reproductive: Status post hysterectomy.  No adnexal masses.

Other: No abdominopelvic ascites.

Musculoskeletal: Degenerative changes of the lower thoracic spine.
IMPRESSION: No evidence of bowel obstruction. Appendix is not discretely
visualized and may be surgically absent.

Status post cholecystectomy, left hemicolectomy, and hysterectomy.

## 2022-09-07 IMAGING — CT CT HEAD W/O CM
4 series · 16 of 47 positions shown, 18 images · non-contrast
Comparison: 01/23/2020

CLINICAL DATA: Seizure

EXAM:
CT HEAD WITHOUT CONTRAST
TECHNIQUE: Contiguous axial images were obtained from the base of the skull
through the vertex without intravenous contrast.

[Series 2: head wo · axial · 0.41mm/px · z∈[-135,-25]mm · 7 of 30 slices shown, 9 images]
[im 4/30  brain]
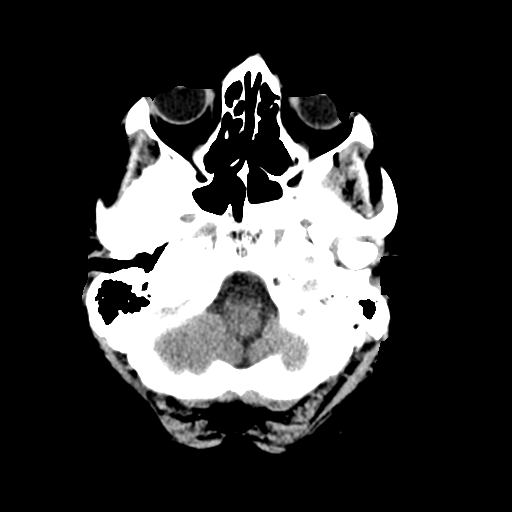
[im 4/30  bone]
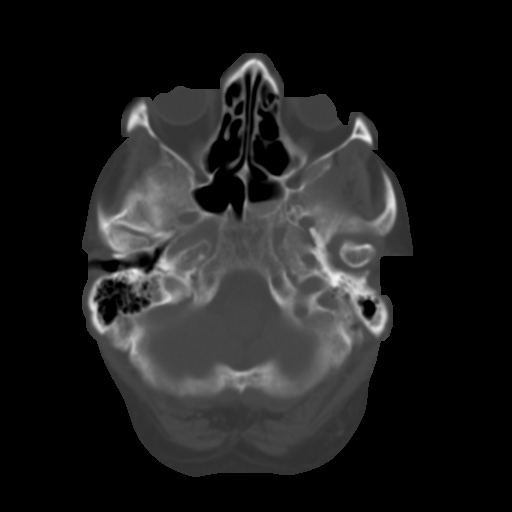
[im 8/30  brain]
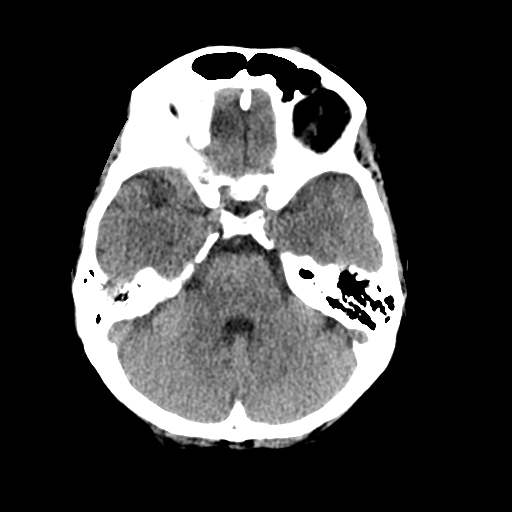
[im 11/30  brain]
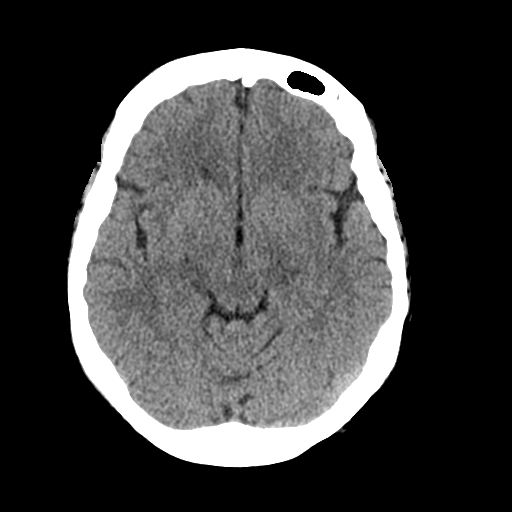
[im 15/30  brain]
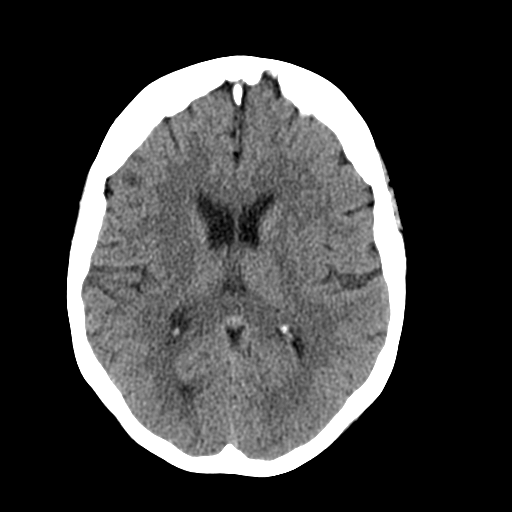
[im 19/30  brain]
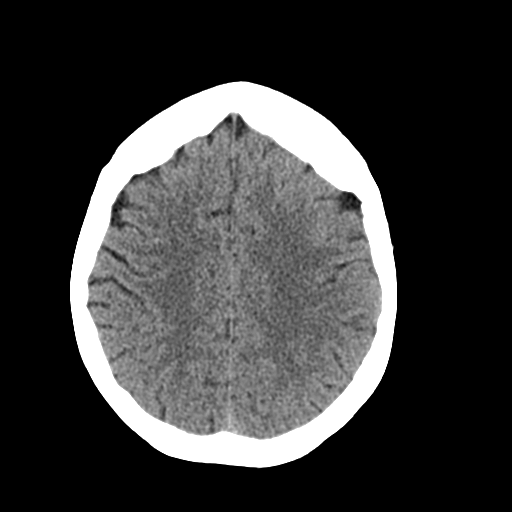
[im 19/30  bone]
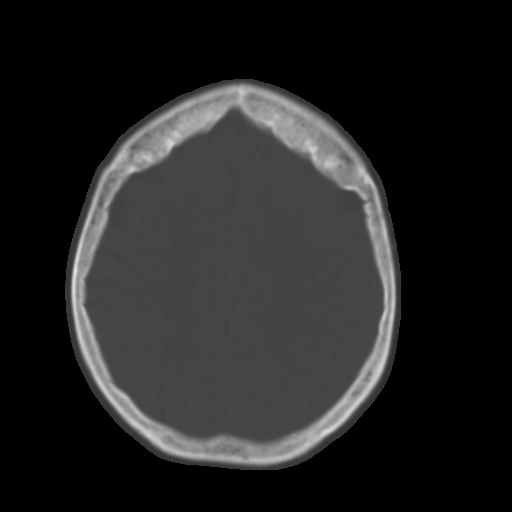
[im 22/30  brain]
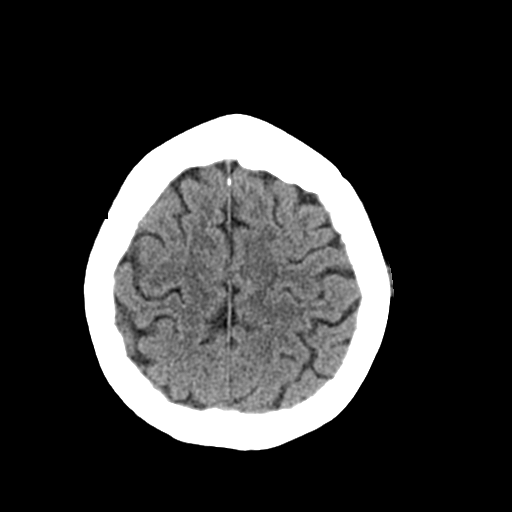
[im 26/30  brain]
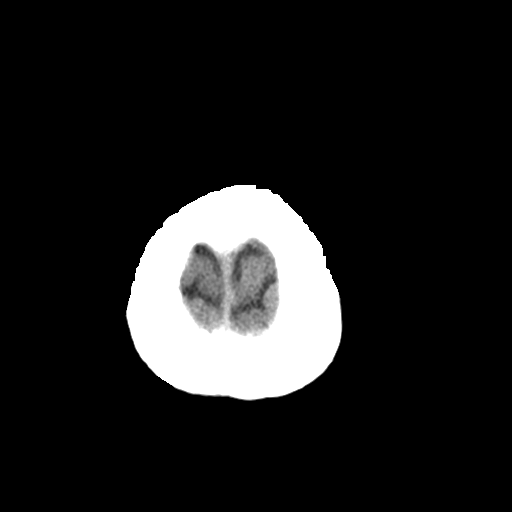

[Series 3: head bone · axial · 0.41mm/px · z∈[-136,-106]mm · 3 of 75 slices shown]
[im 8/75  bone]
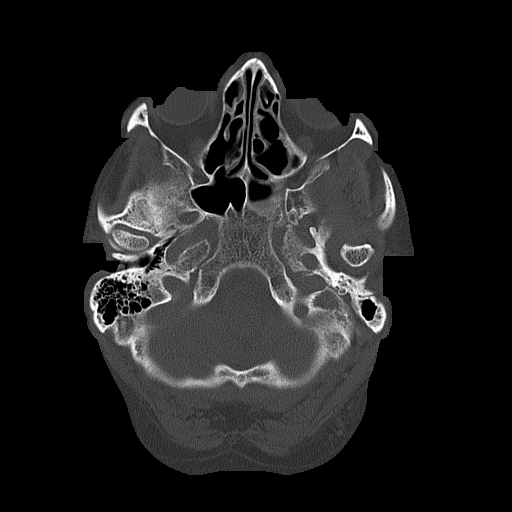
[im 15/75  bone]
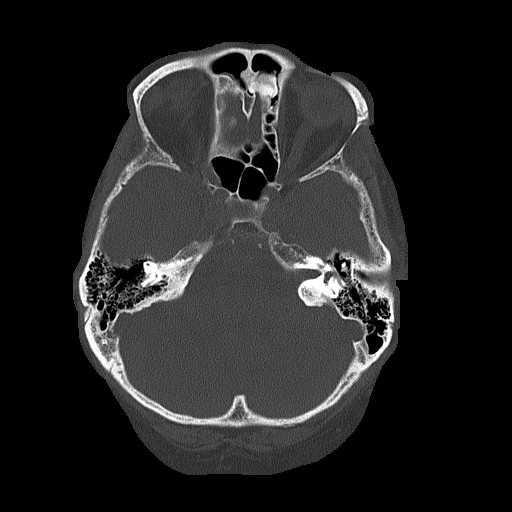
[im 23/75  bone]
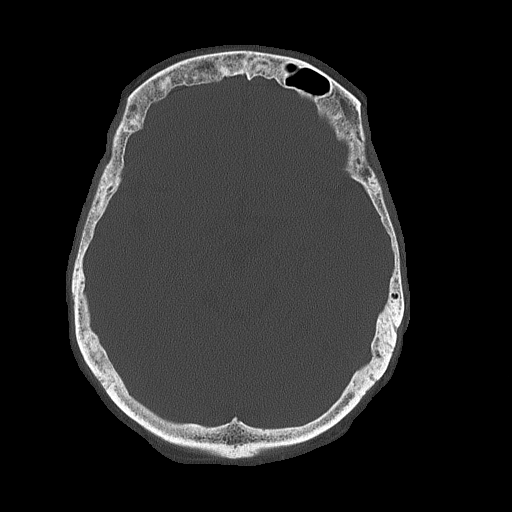

[Series 4: coronal soft tissue · coronal · 0.29mm/px · 3 of 63 slices shown]
[im 21/63  brain]
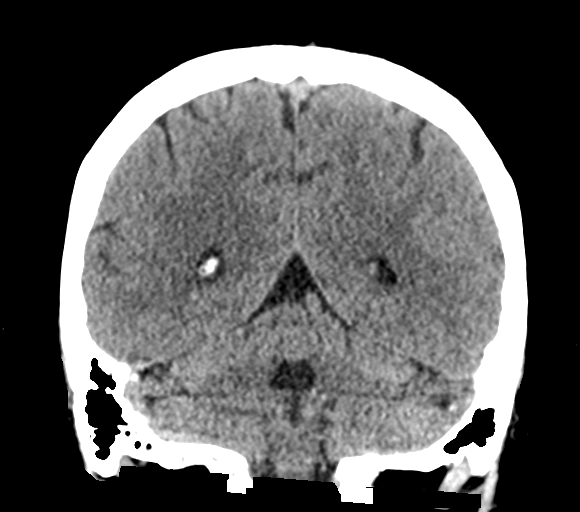
[im 28/63  brain]
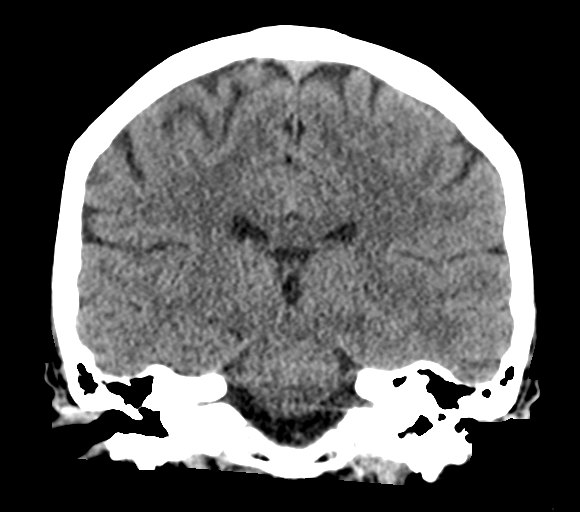
[im 35/63  brain]
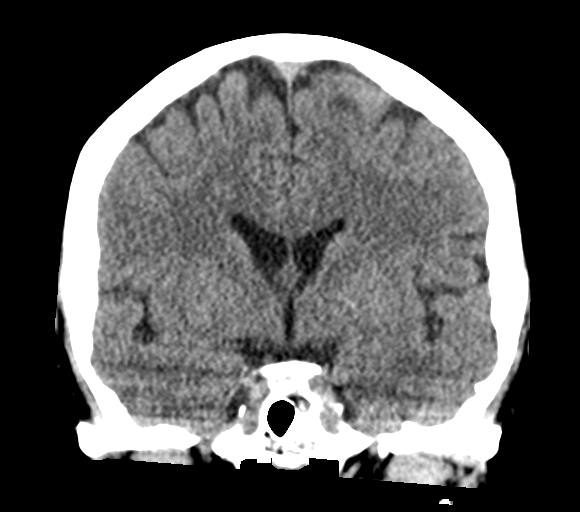

[Series 5: sagittal soft tissue · sagittal · 0.29mm/px · 3 of 56 slices shown]
[im 19/56  brain]
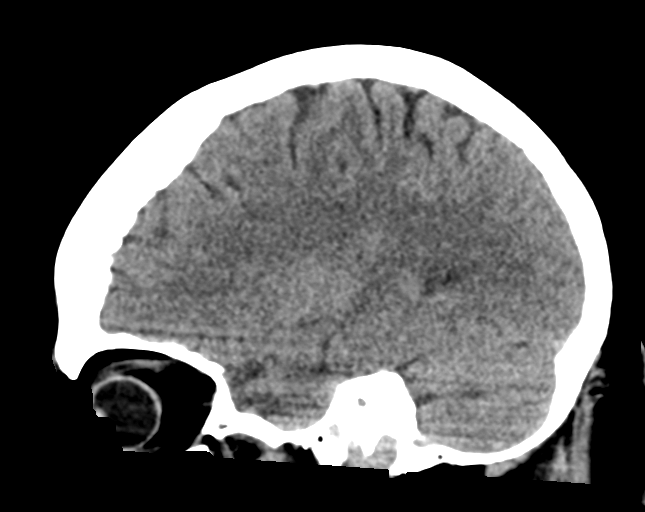
[im 28/56  brain]
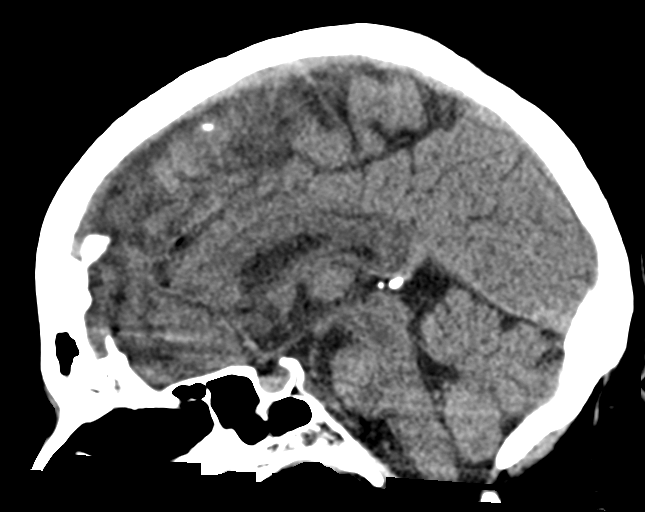
[im 37/56  brain]
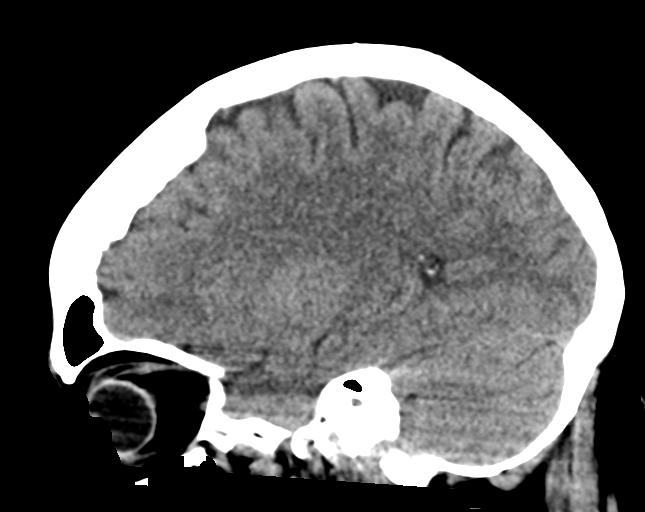

[16 of 47 positions shown; findings below may reference images not displayed]

FINDINGS: Brain: No evidence of acute infarction, hemorrhage, hydrocephalus,
extra-axial collection or mass lesion/mass effect.

Vascular: No hyperdense vessel or unexpected calcification.

Skull: Normal. Negative for fracture or focal lesion.

Sinuses/Orbits: The visualized paranasal sinuses are essentially
clear. The mastoid air cells are unopacified. Postsurgical changes
related to prior FESS. Osteoid osteoma in the left frontal sinus.

Other: None.
IMPRESSION: No evidence of acute intracranial abnormality.

## 2022-09-11 ENCOUNTER — Other Ambulatory Visit: Payer: Self-pay

## 2022-09-11 ENCOUNTER — Emergency Department: Payer: 59

## 2022-09-11 ENCOUNTER — Encounter: Payer: Self-pay | Admitting: Oncology

## 2022-09-11 ENCOUNTER — Emergency Department
Admission: EM | Admit: 2022-09-11 | Discharge: 2022-09-11 | Disposition: A | Discharge status: Home / Self Care | Payer: 59 | Attending: Emergency Medicine | Admitting: Emergency Medicine

## 2022-09-11 DIAGNOSIS — J4521 Mild intermittent asthma with (acute) exacerbation: Secondary | ICD-10-CM | POA: Diagnosis not present

## 2022-09-11 DIAGNOSIS — E876 Hypokalemia: Secondary | ICD-10-CM | POA: Diagnosis present

## 2022-09-11 DIAGNOSIS — J449 Chronic obstructive pulmonary disease, unspecified: Secondary | ICD-10-CM | POA: Diagnosis not present

## 2022-09-11 DIAGNOSIS — Z91040 Latex allergy status: Secondary | ICD-10-CM | POA: Diagnosis not present

## 2022-09-11 DIAGNOSIS — Z1152 Encounter for screening for COVID-19: Secondary | ICD-10-CM | POA: Diagnosis not present

## 2022-09-11 DIAGNOSIS — D3A8 Other benign neuroendocrine tumors: Secondary | ICD-10-CM | POA: Diagnosis present

## 2022-09-11 DIAGNOSIS — R0600 Dyspnea, unspecified: Secondary | ICD-10-CM | POA: Diagnosis not present

## 2022-09-11 DIAGNOSIS — R058 Other specified cough: Secondary | ICD-10-CM

## 2022-09-11 DIAGNOSIS — E1140 Type 2 diabetes mellitus with diabetic neuropathy, unspecified: Secondary | ICD-10-CM | POA: Insufficient documentation

## 2022-09-11 DIAGNOSIS — I5022 Chronic systolic (congestive) heart failure: Secondary | ICD-10-CM | POA: Diagnosis present

## 2022-09-11 DIAGNOSIS — E039 Hypothyroidism, unspecified: Secondary | ICD-10-CM | POA: Diagnosis not present

## 2022-09-11 DIAGNOSIS — Z79899 Other long term (current) drug therapy: Secondary | ICD-10-CM | POA: Diagnosis not present

## 2022-09-11 DIAGNOSIS — I110 Hypertensive heart disease with heart failure: Secondary | ICD-10-CM | POA: Insufficient documentation

## 2022-09-11 DIAGNOSIS — J45909 Unspecified asthma, uncomplicated: Secondary | ICD-10-CM | POA: Diagnosis not present

## 2022-09-11 DIAGNOSIS — I5042 Chronic combined systolic (congestive) and diastolic (congestive) heart failure: Secondary | ICD-10-CM | POA: Diagnosis present

## 2022-09-11 DIAGNOSIS — R0602 Shortness of breath: Secondary | ICD-10-CM | POA: Diagnosis present

## 2022-09-11 DIAGNOSIS — Z85020 Personal history of malignant carcinoid tumor of stomach: Secondary | ICD-10-CM | POA: Diagnosis not present

## 2022-09-11 DIAGNOSIS — Z96652 Presence of left artificial knee joint: Secondary | ICD-10-CM | POA: Diagnosis not present

## 2022-09-11 DIAGNOSIS — Z95810 Presence of automatic (implantable) cardiac defibrillator: Secondary | ICD-10-CM | POA: Insufficient documentation

## 2022-09-11 LAB — RESPIRATORY PANEL BY PCR

## 2022-09-11 LAB — CBC
HCT: 33.9 % — ABNORMAL LOW (ref 36.0–46.0)
Hemoglobin: 10.8 g/dL — ABNORMAL LOW (ref 12.0–15.0)
MCH: 29.8 pg (ref 26.0–34.0)
MCHC: 31.9 g/dL (ref 30.0–36.0)
MCV: 93.4 fL (ref 80.0–100.0)
Platelets: 296 10*3/uL (ref 150–400)
RBC: 3.63 MIL/uL — ABNORMAL LOW (ref 3.87–5.11)
RDW: 14.4 % (ref 11.5–15.5)
WBC: 6.6 10*3/uL (ref 4.0–10.5)
nRBC: 0 % (ref 0.0–0.2)

## 2022-09-11 LAB — PROTIME-INR
INR: 1.1 (ref 0.8–1.2)
Prothrombin Time: 13.8 seconds (ref 11.4–15.2)

## 2022-09-11 LAB — BASIC METABOLIC PANEL
Anion gap: 11 (ref 5–15)
BUN: 13 mg/dL (ref 6–20)
CO2: 30 mmol/L (ref 22–32)
Calcium: 8.2 mg/dL — ABNORMAL LOW (ref 8.9–10.3)
Chloride: 95 mmol/L — ABNORMAL LOW (ref 98–111)
Creatinine, Ser: 0.95 mg/dL (ref 0.44–1.00)
GFR, Estimated: >60 mL/min (ref >60)
Glucose, Bld: 146 mg/dL — ABNORMAL HIGH (ref 70–99)
Potassium: 2.9 mmol/L — ABNORMAL LOW (ref 3.5–5.1)
Sodium: 136 mmol/L (ref 135–145)

## 2022-09-11 LAB — BRAIN NATRIURETIC PEPTIDE: B Natriuretic Peptide: 16.8 pg/mL (ref 0.0–100.0)

## 2022-09-11 LAB — RESP PANEL BY RT-PCR (RSV, FLU A&B, COVID)  RVPGX2
Influenza A by PCR: NEGATIVE
Influenza B by PCR: NEGATIVE
Resp Syncytial Virus by PCR: NEGATIVE
SARS Coronavirus 2 by RT PCR: NEGATIVE

## 2022-09-11 LAB — TROPONIN I (HIGH SENSITIVITY)
Troponin I (High Sensitivity): 3 ng/L (ref <18)
Troponin I (High Sensitivity): 3 ng/L (ref <18)

## 2022-09-11 LAB — APTT: aPTT: 27 seconds (ref 24–36)

## 2022-09-11 LAB — D-DIMER, QUANTITATIVE: D-Dimer, Quant: 3.28 ug/mL-FEU — ABNORMAL HIGH (ref 0.00–0.50)

## 2022-09-11 LAB — POC URINE PREG, ED: Preg Test, Ur: NEGATIVE

## 2022-09-11 IMAGING — CT CT HEAD W/O CM
3 series · 15 of 47 positions shown, 18 images · non-contrast
Comparison: None.

CLINICAL DATA: Altered mental status.

EXAM:
CT HEAD WITHOUT CONTRAST
TECHNIQUE: Contiguous axial images were obtained from the base of the skull
through the vertex without intravenous contrast.

[Series 2: head wo · axial · 0.41mm/px · z∈[-95,+30]mm · 9 of 30 slices shown, 12 images]
[im 3/30  brain]
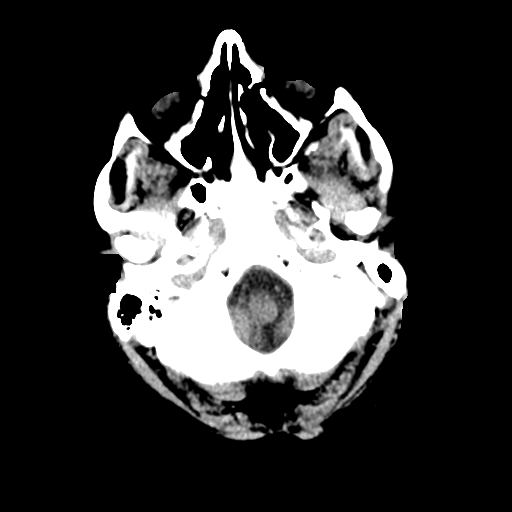
[im 3/30  bone]
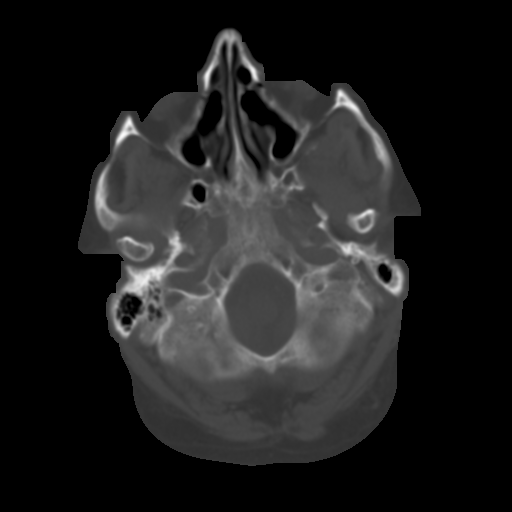
[im 6/30  brain]
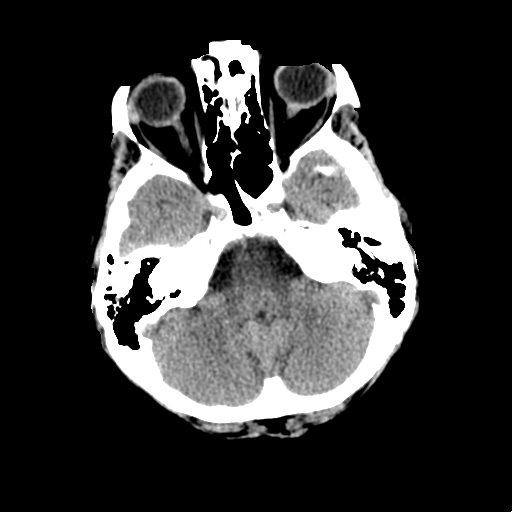
[im 9/30  brain]
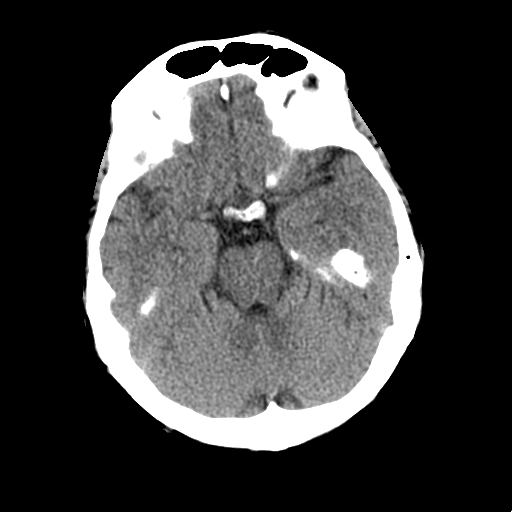
[im 12/30  brain]
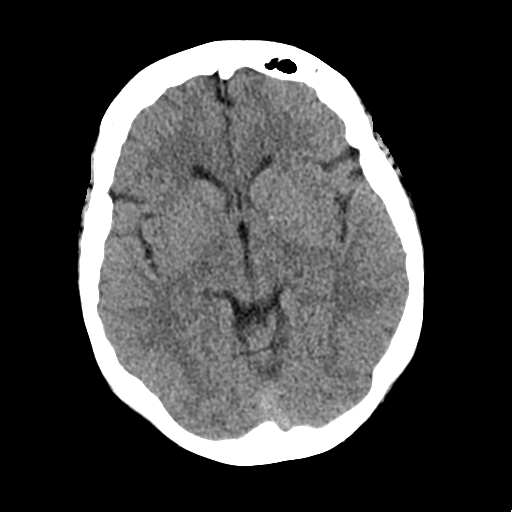
[im 16/30  brain]
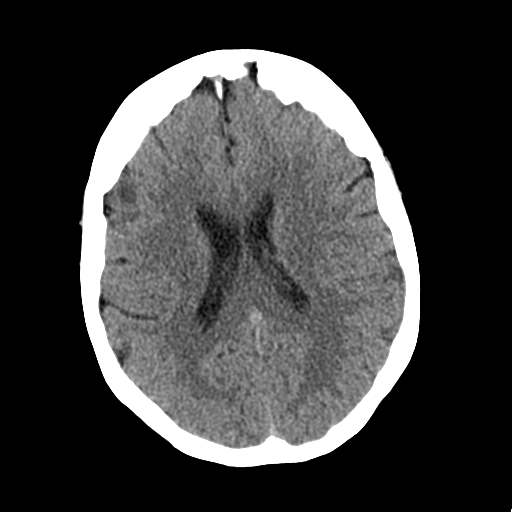
[im 16/30  bone]
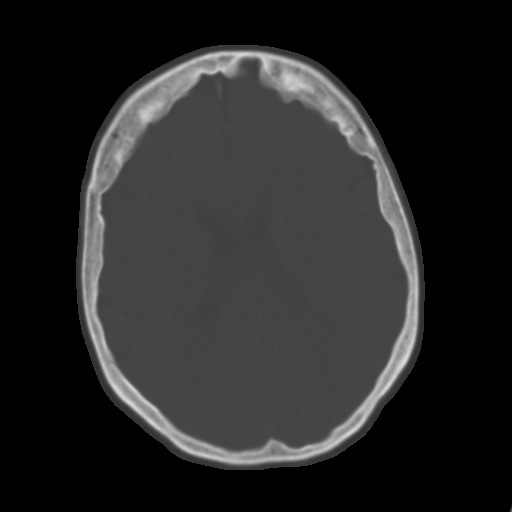
[im 19/30  brain]
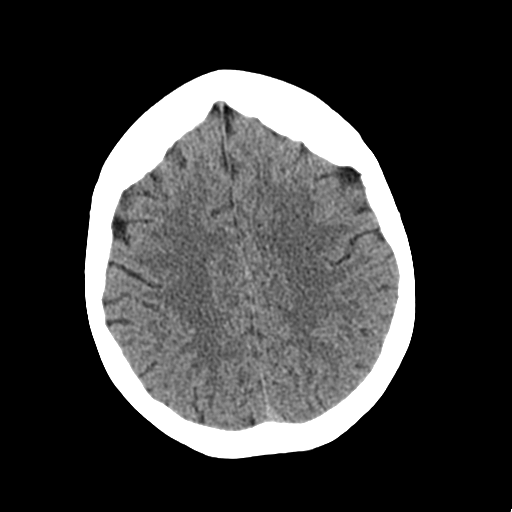
[im 22/30  brain]
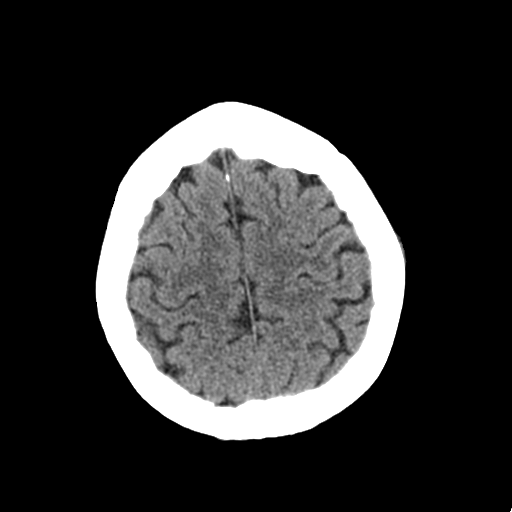
[im 25/30  brain]
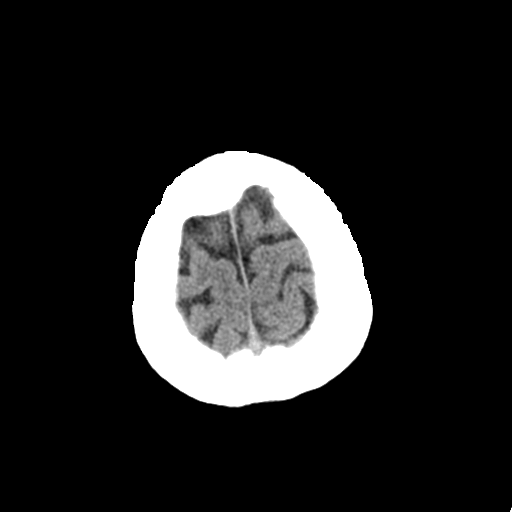
[im 28/30  brain]
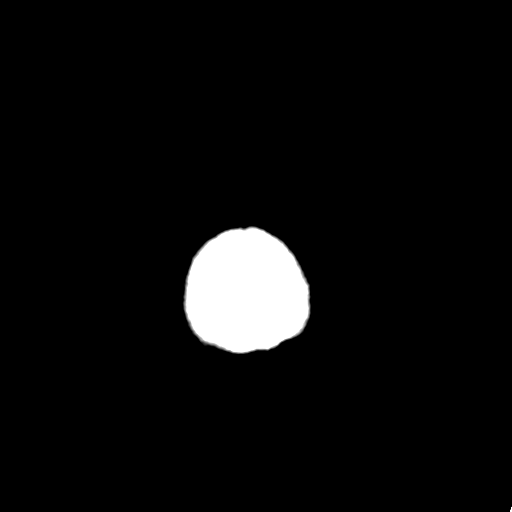
[im 28/30  bone]
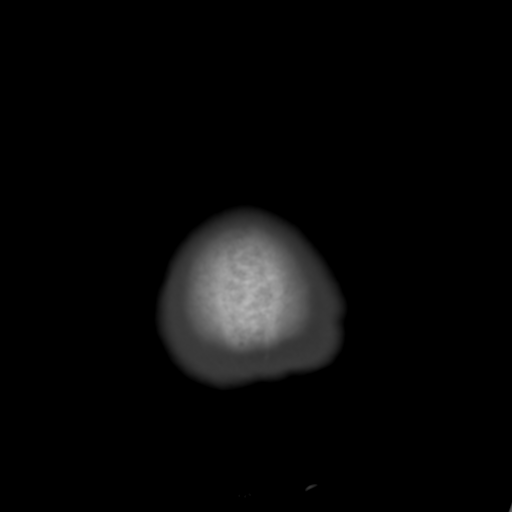

[Series 4: coronal soft tissue · coronal · 0.29mm/px · 3 of 63 slices shown]
[im 21/63  brain]
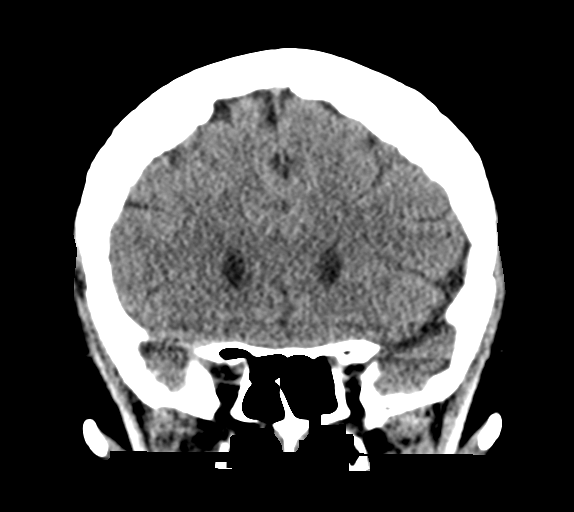
[im 28/63  brain]
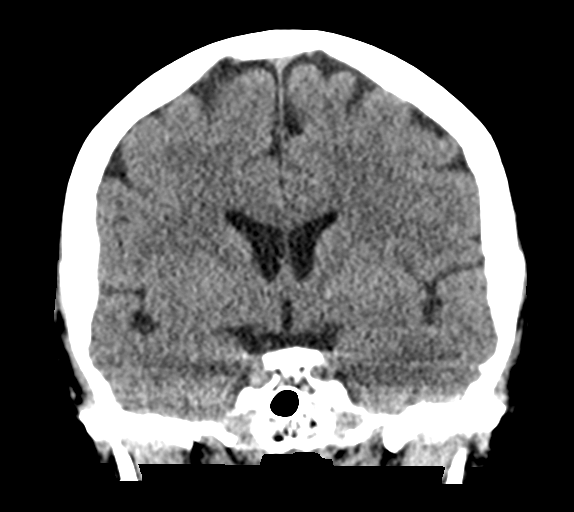
[im 35/63  brain]
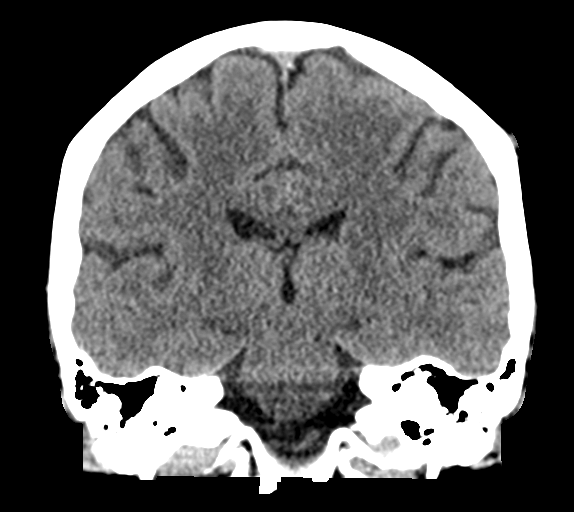

[Series 5: sagittal soft tissue · sagittal · 0.29mm/px · 3 of 55 slices shown]
[im 19/55  brain]
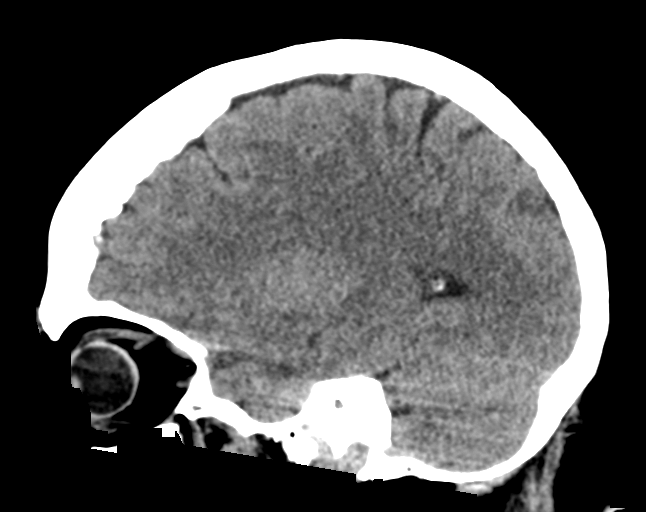
[im 28/55  brain]
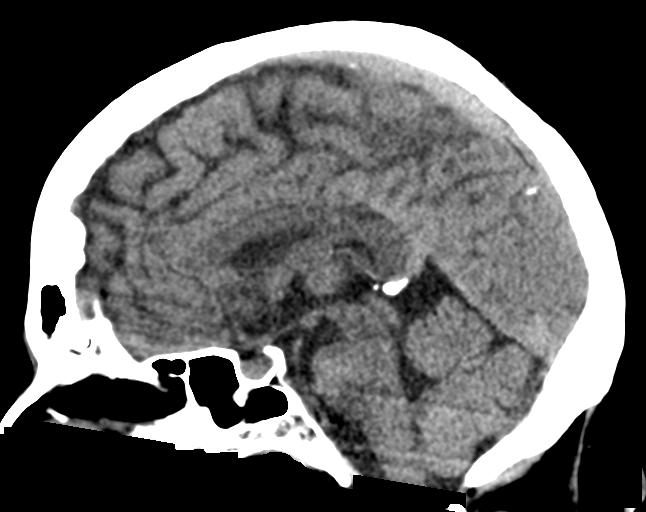
[im 37/55  brain]
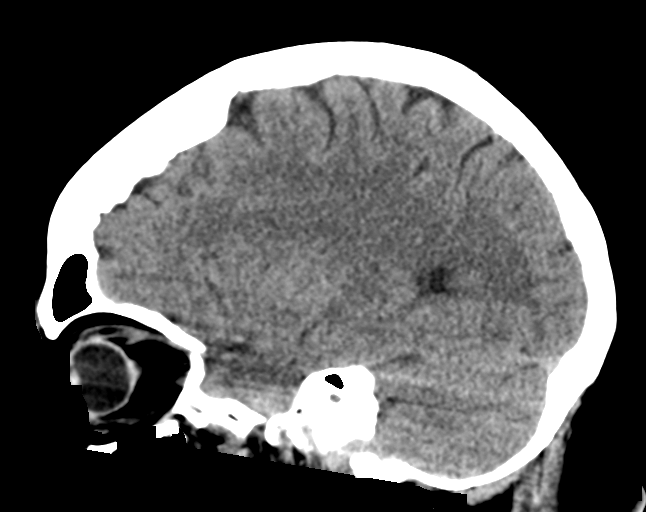

[15 of 47 positions shown; findings below may reference images not displayed]

FINDINGS: Brain: The ventricles and sulci appropriate size for patient's age.
The gray-white matter discrimination is preserved. There is no acute
intracranial hemorrhage. No mass effect or midline shift no
extra-axial fluid collection.

Vascular: No hyperdense vessel or unexpected calcification.

Skull: Normal. Negative for fracture or focal lesion.

Sinuses/Orbits: Mild diffuse mucoperiosteal thickening of paranasal
sinuses. Bilateral maxillary sinus surgeries. The mastoid air cells
are clear.

Other: None
IMPRESSION: Unremarkable noncontrast CT of the brain.

## 2022-09-11 MED ORDER — DIPHENHYDRAMINE HCL 25 MG PO CAPS: 50.0000 mg | ORAL_CAPSULE | Freq: Once | ORAL | Status: DC

## 2022-09-11 MED ORDER — OXYCODONE-ACETAMINOPHEN 5-325 MG PO TABS: 1.0000 | ORAL_TABLET | Freq: Four times a day (QID) | ORAL | Status: DC | PRN

## 2022-09-11 MED ORDER — IPRATROPIUM-ALBUTEROL 0.5-2.5 (3) MG/3ML IN SOLN
3.0000 mL | Freq: Once | RESPIRATORY_TRACT | Status: AC
  Administered 2022-09-11: 3 mL via RESPIRATORY_TRACT
  Filled 2022-09-11: qty 3

## 2022-09-11 MED ORDER — HEPARIN BOLUS VIA INFUSION
5000.0000 [IU] | Freq: Once | INTRAVENOUS | Status: DC
  Filled 2022-09-11: qty 5000

## 2022-09-11 MED ORDER — HEPARIN (PORCINE) 25000 UT/250ML-% IV SOLN: 1350.0000 [IU]/h | INTRAVENOUS | Status: DC

## 2022-09-11 MED ORDER — PREDNISONE 10 MG PO TABS: ORAL_TABLET | ORAL | 0 refills | Status: DC

## 2022-09-11 MED ORDER — MORPHINE SULFATE 15 MG PO TABS: 15.0000 mg | ORAL_TABLET | ORAL | Status: DC | PRN

## 2022-09-11 MED ORDER — METHYLPREDNISOLONE SODIUM SUCC 40 MG IJ SOLR: 40.0000 mg | Freq: Once | INTRAMUSCULAR | Status: DC

## 2022-09-11 MED ORDER — BENZONATATE 100 MG PO CAPS: 100.0000 mg | ORAL_CAPSULE | Freq: Three times a day (TID) | ORAL | 0 refills | Status: DC | PRN

## 2022-09-11 MED ORDER — POTASSIUM CHLORIDE 10 MEQ/100ML IV SOLN
10.0000 meq | Freq: Once | INTRAVENOUS | Status: AC
  Administered 2022-09-11: 10 meq via INTRAVENOUS
  Filled 2022-09-11: qty 100

## 2022-09-11 MED ORDER — ACETAMINOPHEN 500 MG PO TABS: 1000.0000 mg | ORAL_TABLET | Freq: Four times a day (QID) | ORAL | 2 refills | Status: AC | PRN

## 2022-09-11 MED ORDER — DIPHENHYDRAMINE HCL 50 MG/ML IJ SOLN: 50.0000 mg | Freq: Once | INTRAMUSCULAR | Status: DC

## 2022-09-11 MED ORDER — POTASSIUM CHLORIDE CRYS ER 20 MEQ PO TBCR
40.0000 meq | EXTENDED_RELEASE_TABLET | Freq: Once | ORAL | Status: AC
  Administered 2022-09-11: 40 meq via ORAL
  Filled 2022-09-11: qty 2

## 2022-09-11 NOTE — ED Triage Notes (Addendum)
Pt states she has had a cough that developed last night. Pt denies fevers. Pt reports shortness of breath as well. Pt states left knee surgery 2 weeks ago

## 2022-09-11 NOTE — ED Provider Notes (Signed)
Physical Exam  BP 113/81   Pulse (!) 101   Temp 98.7 F (37.1 C) (Oral)   Resp 18   Ht '5\' 5"'$  (1.651 m)   Wt 88.9 kg   LMP 01/22/2012 (Approximate) Comment: Hysterectomy 5 years ago  SpO2 96%   BMI 32.62 kg/m   Physical Exam  Vitals and nursing note reviewed.  Constitutional:      General: Awake and alert. No acute distress.    Appearance: Normal appearance. The patient is normal weight.  HENT:     Head: Normocephalic and atraumatic.     Mouth: Mucous membranes are moist.  Eyes:     General: PERRL. Normal EOMs        Right eye: No discharge.        Left eye: No discharge.     Conjunctiva/sclera: Conjunctivae normal.  Cardiovascular:     Rate and Rhythm: Tachycardic rate and regular rhythm.     Pulses: Normal pulses.     Heart sounds: Normal heart sounds Pulmonary:     Effort: Pulmonary effort is normal. No respiratory distress.     Breath sounds: Normal breath sounds.  Abdominal:     Abdomen is soft. There is no abdominal tenderness. No rebound or guarding. No distention. Musculoskeletal:        General: No swelling. Normal range of motion.     Cervical back: Normal range of motion and neck supple.  Mild edema in left lower extremity.  Surgical site is clean, dry, intact.  No erythema.  No warmth to the knee Skin:    General: Skin is warm and dry.     Capillary Refill: Capillary refill takes less than 2 seconds.     Findings: No rash.  Neurological:     Mental Status: The patient is awake and alert.   Procedures  Procedures  ED Course / MDM   Clinical Course as of 09/11/22 1622  Sun Sep 11, 2022  1132 Patient had breakthrough reaction to contrast even with 13-hour prep and free medication.  CT scan reports that they will not scan this patient.  Discussed with Drs. Mumma and Bradler.  Will obtain ultrasound bilateral lower extremities and VQ scan.  If you VQ scan is unavailable on weekends, then will admit to hospitalist and start on heparin [JP]  1155 V/q scan  unavailable today [JP]  1229 Discussed with Dr. Charleen Kirks who will come to evaluate the patient [JP]  1340 Per Dr. Hilton Sinclair she would like to obtain bilateral upper and lower extremity ultrasounds and a CT chest without contrast for evaluation of possible viral etiology in addition to respiratory swab [JP]    Clinical Course User Index [JP] Poggi, Clarnce Flock, PA-C   Medical Decision Making Amount and/or Complexity of Data Reviewed Labs: ordered. Radiology: ordered.  Risk Prescription drug management.   This patient's care was transferred over to me from [facility], PA-C at approximately 1500.  Plan is to await the results of the CT chest and bilateral DVT ultrasounds.  If negative, will plan for possible discharge.  The noncontrast CT scan does not show any evidence of acute thoracic abnormalities.  The bilateral ultrasounds do not show any evidence of DVTs.  Pending respiratory panel.  I spoke to hospitalist Dr. Charleen Kirks, who advised that this is likely a asthma exacerbation. On reexamination of the patient, she states that she feels much better after the DuoNebs and feels comfortable going home.  Given her clinical presentation at this time, I agree  that this is likely a asthma exacerbation that was likely precipitated by a underlying viral respiratory infection.  However, I did strongly emphasized to the patient that we cannot definitively rule out pulmonary embolism at this time given the noncontrast study.  Patient expressed understanding.  She states that she feels comfortable going home based off of these findings and will follow with her primary care provider within the next couple days.  Given the improvement in her symptoms, I believe this is reasonable at this time.  However, I did strongly emphasized to her again that if she begins to experience any new or worsening symptoms that she should return promptly to the emergency department.  She expressed understanding and agreed.  Will provide  her with a course of prednisone and acetaminophen.  Attempted to prescribe her a Theme park [employer], however patient has had poor experiences with these inhalers in the past and would rather take her own that she has at home as they  work for her.  I believe this is reasonable.  Will discharge.        Teodoro Spray, [place] 09/11/22 1622    Carrie Mew, MD 09/12/22 [phone]

## 2022-09-11 NOTE — ED Provider Notes (Signed)
[facility] Provider Note    Event Date/Time   First MD Initiated Contact with Patient 09/11/22 1046     (approximate)   History   Cough and Shortness of Breath   HPI  Brittany Nguyen is a 57 y.o. female with a past medical history of DVT, anxiety, hypertension, seizures, malignant carcinoid tumor of the stomach, status post left total knee arthroplasty on 08/24/2022 (with DVT prophylaxis in the form of early mobilization, Lovenox, TED hose, and SCDs) who presents today for evaluation of shortness of breath and dry cough that began yesterday.  She reports that she has chest pain as well.  She reports that she had an IVC filter placed prior to surgery and was treated with 2 weeks of Lovenox once a day which she has since stopped.  Patient Active Problem List   Diagnosis Date Noted   Nonproductive cough 09/11/2022   Hypothyroidism 09/11/2022   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   CHF (congestive heart failure) ([place]) 08/17/2022   History of DVT (deep vein thrombosis) 08/17/2022   MRSA (methicillin resistant Staphylococcus aureus) 08/17/2022   Hypocalcemia 07/31/2022   Diarrhea 07/31/2022   Essential hypertension 07/31/2022   Morbid obesity with body mass index (BMI) of 50.0 to 59.9 in adult [facility]) 07/31/2022   Seizures ([place]) 07/30/2022   Breast cancer ([place]) 05/23/2022   Bloody stool 04/22/2022   Seizure ([place]) 03/01/2022   Refractory nausea and vomiting 12/15/2021   Migraine 11/09/2021   Neuropathy 11/09/2021   Anxiety 11/09/2021   At risk for prolonged QT interval syndrome - borderline long QT on EKG 11/07/21 in ED 11/07/2021   Prolonged QT interval 09/27/2021   Cancer related pain 08/10/2021   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[place]) 10/19/2018   Coronary artery disease involving native coronary artery of native heart 08/24/2018   Mild intermittent asthma without complication 54/05/8118   Breast mass 08/08/2018   Intractable migraine with aura without status migrainosus 06/20/2018   Osteoarthritis of knee 06/01/2018   Pain in right knee 05/15/2018   Abdominal wall pain in right flank 04/10/2018   Postcholecystectomy diarrhea 04/10/2018   Iron deficiency anemia 03/27/2018   Unstable angina (HCC)    Encounter for anticoagulation discussion and counseling    COPD (chronic obstructive pulmonary disease) ([place]) 04/26/2017   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 02/03/2017   Hypotension 02/03/2017   RLS (restless legs syndrome) 12/09/2016   Hypokalemia 11/22/2015   OSA on CPAP 04/17/2014   Hx MRSA infection    Lump or mass in breast    Asthma 2013   GOITER, MULTINODULAR 01/20/2009   HYPOGLYCEMIA, UNSPECIFIED 01/20/2009   GERD 01/20/2009   DIVERTICULITIS OF COLON 01/20/2009   Diverticulitis of colon 01/20/2009          Physical Exam   Triage Vital  Signs: ED Triage Vitals  Enc Vitals Group     BP 09/11/22 1018 111/73     Pulse Rate 09/11/22 1018 (!) 114     Resp 09/11/22 1018 (!) 22     Temp 09/11/22 1018 98.7 F (37.1 C)     Temp Source 09/11/22 1018 Oral     SpO2 09/11/22 1018 94 %     Weight 09/11/22 1019 196 lb (88.9 kg)     Height  09/11/22 1019 '5\' 5"'$  (1.651 m)     Head Circumference --      Peak Flow --      Pain Score 09/11/22 1019 8     Pain Loc --      Pain Edu? --      Excl. in [place]? --     Most recent vital signs: Vitals:   09/11/22 1018 09/11/22 1130  BP: 111/73 113/81  Pulse: (!) 114 (!) 101  Resp: (!) 22 18  Temp: 98.7 F (37.1 C)   SpO2: 94% 96%    Physical Exam Vitals and nursing note reviewed.  Constitutional:      General: Awake and alert. No acute distress.    Appearance: Normal appearance. The patient is normal weight.  HENT:     Head: Normocephalic and atraumatic.     Mouth: Mucous membranes are moist.  Eyes:     General: PERRL. Normal EOMs        Right eye: No discharge.        Left eye: No discharge.     Conjunctiva/sclera: Conjunctivae normal.  Cardiovascular:     Rate and Rhythm: Tachycardic rate and regular rhythm.     Pulses: Normal pulses.     Heart sounds: Normal heart sounds Pulmonary:     Effort: Pulmonary effort is normal. No respiratory distress.     Breath sounds: Normal breath sounds.  Abdominal:     Abdomen is soft. There is no abdominal tenderness. No rebound or guarding. No distention. Musculoskeletal:        General: No swelling. Normal range of motion.     Cervical back: Normal range of motion and neck supple.  Mild edema in left lower extremity.  Surgical site is clean, dry, intact.  No erythema.  No warmth to the knee Skin:    General: Skin is warm and dry.     Capillary Refill: Capillary refill takes less than 2 seconds.     Findings: No rash.  Neurological:     Mental Status: The patient is awake and alert.      ED Results / Procedures / Treatments   Labs (all labs ordered are listed, but only abnormal results are displayed) Labs Reviewed  BASIC METABOLIC PANEL - Abnormal; Notable for the following components:      Result Value   Potassium 2.9 (*)    Chloride 95 (*)    Glucose, Bld 146 (*)    Calcium 8.2 (*)    All other components within  normal limits  CBC - Abnormal; Notable for the following components:   RBC 3.63 (*)    Hemoglobin 10.8 (*)    HCT 33.9 (*)    All other components within normal limits  D-DIMER, QUANTITATIVE - Abnormal; Notable for the following components:   D-Dimer, Quant 3.28 (*)    All other components within normal limits  RESP PANEL BY RT-PCR (RSV, FLU A&B, COVID)  [id number]  RESPIRATORY PANEL BY PCR  BRAIN NATRIURETIC PEPTIDE  PROTIME-INR  APTT  POC URINE PREG, ED  TROPONIN I (HIGH SENSITIVITY)  TROPONIN I (HIGH SENSITIVITY)     EKG     RADIOLOGY I independently reviewed and interpreted imaging and agree with radiologists findings.     PROCEDURES:  Critical Care performed:   Procedures   MEDICATIONS ORDERED IN ED: Medications  oxyCODONE-acetaminophen (PERCOCET/ROXICET) 5-325 MG per tablet 1 tablet (has no administration in time range)  morphine (MSIR) tablet 15 mg (has no administration in time range)  potassium chloride 10 mEq in 100 mL IVPB (0 mEq Intravenous Stopped 09/11/22 1317)  ipratropium-albuterol (DUONEB) 0.5-2.5 (3) MG/3ML nebulizer solution 3 mL (3 mLs Nebulization Given 09/11/22 1504)  potassium chloride SA (KLOR-CON M) CR tablet 40 mEq (40 mEq Oral Given 09/11/22 1504)     IMPRESSION / MDM / ASSESSMENT AND PLAN / ED COURSE  I reviewed the triage vital signs and the nursing notes.   Differential diagnosis includes, but is not limited to, pulmonary embolism, deep venous thrombosis, pneumonia, bronchitis.  Patient is awake and alert, and afebrile.  She has tachycardia and hypoxia on arrival.  She was placed on the cardiac monitor and continuous pulse oximetry.  She has very high risk for pulmonary embolism given her history of DVT, or recent major surgery, and recently discontinuing her anticoagulation.  She unfortunately has a severe allergy to contrast dye, reporting that she has required a ventilator in the past.  She has even apparently failed with a 13-hour prep and  had a breakthrough contrast reaction.  Therefore CT scan does not wish to scan her with contrast.  Unfortunately there is no nuclear medicine available today for a VQ scan.  My plan originally was to get ultrasounds of her lower extremities, initiate heparin, and admit due to her high risk pulmonary embolism, tachycardia, and hypoxia with her cough and shortness of breath and chest discomfort until she is able to get more definitive testing tomorrow.  I discussed this with Dr. Jori Moll and Cheri Fowler who are in agreement with this plan.  I subsequently discussed this with Dr. Charleen Kirks with the hospitalist team who came to evaluate the patient.  She feels that this is most likely to be viral in etiology and does not feel that the patient requires admission.  She has requested a respiratory swab for the 20 other viruses, in addition to upper extremity ultrasounds and CT chest without contrast.  I am slightly uncomfortable discharging this patient given her high risk features for pulmonary embolism, and subsequently discussed with Dr. Corky Downs.  Patient was passed off to Dr. Corky Downs and [place] pending final disposition.   Patient's presentation is most consistent with acute presentation with potential threat to life or bodily function.   Clinical Course as of 09/11/22 1510  Sun Sep 11, 2022  1132 Patient had breakthrough reaction to contrast even with 13-hour prep and free medication.  CT scan reports that they will not scan this patient.  Discussed with Drs. Mumma and Bradler.  Will obtain ultrasound bilateral lower extremities and VQ scan.  If you VQ scan is unavailable on weekends, then will admit to hospitalist and start on heparin [JP]  1155 V/q scan unavailable today [JP]  1229 Discussed with Dr. Charleen Kirks who will come to evaluate the patient [JP]  1340 Per Dr. Hilton Sinclair she would like to obtain bilateral upper and lower extremity ultrasounds and a CT chest without contrast for evaluation of possible  viral etiology in addition to respiratory swab [JP]    Clinical Course User Index [  JP] Kourtney Montesinos, Clarnce Flock, PA-C     FINAL CLINICAL IMPRESSION(S) / ED DIAGNOSES   Final diagnoses:  Dyspnea, unspecified type  Hypokalemia     Rx / DC Orders   ED Discharge Orders     None        Note:  This document was prepared using Dragon voice recognition software and may include unintentional dictation errors.   Marquette Old, PA-C 09/11/22 1510    Nathaniel Man, MD 09/11/22 1520

## 2022-09-11 NOTE — [place] Notes (Signed)
[place] for initiation and monitoring of heparin infusion Indication: pulmonary embolus  Allergies  Allergen Reactions   Contrast Media [Iodinated Contrast Media] Shortness Of Breath    Per CT report in 2014 pt had break through contrast reaction with hives and SOB following 13 hour prep. MSY    Ferumoxytol Nausea Only   Iron Sucrose Anaphylaxis   Lidocaine Hives   Metrizamide Shortness Of Breath   Penicillins Hives and Other (See Comments)    IgE = 4 (WNL) on 08/12/2022  ediate rash, facial/tongue/throat swelling, SOB or lightheadedness with hypotension, tachy   Sacubitril-Valsartan     Sz (unclear if new start entresto vs hypoglycemia)   Isosorbide Nitrate Other (See Comments)    Headache   Latex Hives   Ondansetron Other (See Comments)    Severe headache   Tizanidine Other (See Comments)    Feels altered   Povidone-Iodine Rash    Blistering rash   Pulmicort [Budesonide] Itching    Patient Measurements: Height: _0  (165.1 cm) Weight: 88.9 kg (196 lb) IBW/kg (Calculated) : 57 Heparin Dosing Weight: 76.5 kg  Vital Signs: Temp: 98.7 F (37.1 C) (01/07 1018) Temp Source: Oral (01/07 1018) BP: 113/81 (01/07 1130) Pulse Rate: 101 (01/07 1130)  Labs: Recent Labs    09/11/22 1022  HGB 10.8*  HCT 33.9*  PLT 296  CREATININE 0.95  TROPONINIHS 3    Estimated Creatinine Clearance: 74.6 mL/min (by C-G formula based on SCr of 0.95 mg/dL).   Medical History: Past Medical History:  Diagnosis Date   Acute anterolateral wall MI ([place]) 08/2016   AICD (automatic cardioverter/defibrillator) present 03/29/2017   a.) Medtronic Evera MRI XT DR SureScan (SN: [id number])   Anxiety    Arthritis    Asthma 2013   C. 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[place])    a.) on lacosamide; b.) last seizure 07-30-22 in setting of hypokalemia (K+ 2.6)   GERD (gastroesophageal reflux disease)    GIB (gastrointestinal bleeding) 01/08/2018   HFrEF (heart failure with reduced ejection fraction) ([place])    a.)TTE 12/09/16: EF 25-30%, dif HK; b.) TTE 02/08/17: EF 25%, dif HK, mild TR, G1DD; c.) TTE 04/25/17: EF 30-35%, mild LVH; d.) TTE 01/30/18: EF 45%, MAC, LAE, mild MR, G1DD; e.) TTE 11/06/2018: EF 25%, sev glob HK, triv MR/TR/PR; f.) TTE 05/12/19: EF 35%, MAC, AoV sclerosis, G1DD; g.) TTE 11/05/19: EF 20%, sev glob HK, triv MR; h.) TTE 06/17/20: EF 40%, mod glob HK, triv MR; I:) TTE 06/14/22: EF 25-30%, G1DD   History of cardiac catheterization    a.) LHC 10/21/2014: normal coronaries; b.) R/LHC 02/24/2017: normal coronaries, mRA 12, mPA 28, mPCWP 15, CO 8.0, CI 3.8   History of kidney stones    Hx MRSA infection    Hypokalemia    Hypothyroidism    Iron deficiency anemia 03/27/2018   Lump or mass in breast 11/27/2012   a.) Bx 11/27/2012 --> apocrine wall  cyst   Migraine    Nausea & vomiting    Neuropathy    Non-ischemic cardiomyopathy ([place])    a.) TTE 12/09/2016: EF 25-30%, b.) TTE 02/08/2017: EF 25%; c.) Medtronic AICD placed 03/29/2017; d.) TTE 04/25/2017: EF 30-35%; e.) TTE 01/30/2018: EF 45%; f.) TTE 11/06/2018: EF 25%; g.) TTE 05/12/2019: EF 35%; h.) TTE 11/05/2019: EF 20%; I.) TTE 06/17/2020: EF 40%; J.) TTE 06/14/2022: EF 25-30%   Obesity    OSA on CPAP    PAF (paroxysmal atrial fibrillation) (HCC)    PONV (postoperative nausea and vomiting)    Post-COVID chronic cough    Postcholecystectomy diarrhea    Prolonged Q-T  interval on ECG    Restless leg    a.) on ropinirole   Unstable angina [facility])     Assessment: 57 y.o. femalee with a past medical history of DVT, anxiety, hypertension, seizures, malignant carcinoid tumor of the stomach, status post left total knee arthroplasty on 08/24/2022 (with DVT prophylaxis in the form of early mobilization, Lovenox, TED hose, and SCDs) who presents today for evaluation of shortness of breath and dry cough. she had an IVC filter placed prior to surgery and was treated with 2 weeks of Lovenox once a day which she has since stopped.    Goal of Therapy:  Heparin level 0.3-0.7 units/ml Monitor platelets by anticoagulation protocol: Yes   Plan:  Give 5000 units bolus x 1 Start heparin infusion at 1350 units/hr Check anti-Xa level in 6 hours and daily while on heparin Continue to monitor H&H and platelets  Dallie Piles 09/11/2022,12:50 PM

## 2022-09-11 NOTE — Assessment & Plan Note (Signed)
Patient is managed by oncology currently being treated with lanreotide injections.  -Continue home pain management

## 2022-09-11 NOTE — Assessment & Plan Note (Signed)
On examination, no evidence of hypervolemia.  No evidence of pulmonary edema on chest x-ray.    -Continue home Lasix, will losartan, metoprolol

## 2022-09-11 NOTE — Assessment & Plan Note (Addendum)
Patient presenting with 1 day history of headache, malaise, nonproductive cough and chest pressure with coughing.    Given history of recurrent DVT/PE, initial concern was for PE.  However, patient has an IVC filter in place.  In addition, she is not hypoxic, and tachycardia has improved with potassium replacement. BNP and troponins are negative.  Bilateral upper and lower Dopplers are negative.  D-dimer may be elevated in the setting of recent operation.    Symptoms altogether with wheezing on examination is more concerning for a viral infection with asthma exacerbation.    - Given patient has improved with DuoNebs and CT does not show any evidence of pneumonia, would recommend discharge home with a course of prednisone and a Saba-ICS combo - Full RVP panel pending

## 2022-09-11 NOTE — Discharge Instructions (Addendum)
-  Fortunately your ultrasound and your CT scan does not show any evidence of blood clots at this time, however because we were not able to get a full contrast study, we cannot definitively rule out blood clots at this time.  That being said, your symptoms are likely related to a viral etiology which is possibly exacerbating her underlying asthma.  Will provide you with a prescription for prednisone.  Please continue to utilize your inhalers at home as needed.  You may take acetaminophen for the body aches.  -It is important that you follow-up with your primary care provider within the next couple days to ensure that you are improving.  If at anytime, you begin to experience any new or worsening symptoms, please return promptly to the emergency department.

## 2022-09-11 NOTE — Consult Note (Signed)
Initial Consultation Note   Patient: Brittany Nguyen Fraleigh ZOX:[id number] DOB: 07/25/1966 PCP: [name], MD DOA: 09/11/2022 DOS: the patient was seen and examined on 09/11/2022 Primary service: Carrie Mew, MD  Referring physician: Sheran Luz Reason for consult: Non-productive cough  Assessment/Plan: Assessment and Plan:  Nonproductive cough Patient presenting with 1 day history of headache, malaise, nonproductive cough and chest pressure with coughing.    Given history of recurrent DVT/PE, initial concern was for PE.  However, patient has an IVC filter in place.  In addition, she is not hypoxic, and tachycardia has improved with potassium replacement. BNP and troponins are negative.  Bilateral upper and lower Dopplers are negative.  D-dimer may be elevated in the setting of recent operation.    Symptoms altogether with wheezing on examination is more concerning for a viral infection with asthma exacerbation.    - Given patient has improved with DuoNebs and CT does not show any evidence of pneumonia, would recommend discharge home with a course of prednisone and a Saba-ICS combo - Full RVP panel pending  Hypothyroidism Acquired hypothyroidism in the setting of thyroidectomy.  Last free T4 within normal limits.  -Continue home Synthroid  Asthma Patient states she has a history of mild intermittent asthma on albuterol only.  On examination today, there is evidence of wheezing concerning for an asthma exacerbation.  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  No evidence of pulmonary edema on chest x-ray.    -Continue home Lasix, will losartan,  metoprolol  Hypokalemia Patient has a history of hypokalemia treated with oral supplementation.  Potassium today is low at 2.9.  - S/p 10 mEq of potassium IV - One-time dose of K-Dur 40 mEq - Resume home potassium supplementation thereafter   TRH will sign off at present, please call us again when needed.  HPI: Brittany Nguyen is a 57 y.o. 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  Patient stated that she received her lanreotide injection on 09/09/2021 and afterwards, she experiences generalized fatigue and malaise.  On 1/6, she awoke to a headache and generalized malaise.  Later in the day, she developed a nonproductive cough that persisted despite Robitussin.  This morning, she noticed some chest pressure when coughing and due to this, came to the ED.  She denies any fever, chills.  She notes a history of asthma currently on albuterol only.  She endorses bilateral lower extremity swelling and some bilateral upper extremity swelling but states this has been present since her knee surgery 2 weeks ago.  She denies any rash or extremity erythema.  ED course: On arrival to the ED, patient was tacky at 114 with respiratory rate of 22.  Blood pressure 111/73 with oxygen saturation of 94% on room air.  Patient's heart rate improved to 90 with potassium replacement.  Initial workup remarkable for potassium of 2.9, chloride of 95, glucose of 146, calcium of 8.2, creatinine of 0.95, and hemoglobin of 10.8.  D-dimer elevated at 3.28.  Troponin negative at 3 and BNP negative at 16.  COVID-19, influenza and RSV PCR negative. Chest x-ray was obtained that did not show any active cardiopulmonary disease.  Due to concern for PE, lower extremity Dopplers were ordered.   CTA unable to be obtained due to anaphylactic shock to contrast.  TRH contacted for consideration of admission.  Review of Systems: As mentioned in the history of present illness. All other systems reviewed and are negative. Past Medical History:  Diagnosis Date   Acute anterolateral wall MI ([place]) 08/2016   AICD (automatic cardioverter/defibrillator) present 03/29/2017   a.) Medtronic Evera MRI XT DR SureScan (SN: [id number])   Anxiety    Arthritis    Asthma 2013   C. 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[place])    a.) on lacosamide; b.) last seizure 07-30-22 in setting of hypokalemia (K+ 2.6)   GERD (gastroesophageal reflux disease)    GIB (gastrointestinal bleeding) 01/08/2018   HFrEF (heart failure with reduced ejection fraction) ([place])    a.)TTE 12/09/16: EF 25-30%, dif HK; b.) TTE 02/08/17: EF 25%, dif HK, mild TR, G1DD; c.) TTE 04/25/17: EF 30-35%, mild LVH; d.) TTE 01/30/18: EF 45%, MAC, LAE, mild MR, G1DD; e.) TTE 11/06/2018: EF 25%, sev glob HK, triv MR/TR/PR; f.) TTE 05/12/19: EF 35%, MAC, AoV sclerosis, G1DD; g.) TTE 11/05/19: EF 20%, sev glob HK, triv MR; h.) TTE 06/17/20: EF 40%, mod glob HK, triv MR; I:) TTE 06/14/22: EF 25-30%, G1DD   History of cardiac catheterization    a.) LHC 10/21/2014: normal coronaries; b.) R/LHC 02/24/2017: normal coronaries, mRA 12, mPA 28, mPCWP 15, CO 8.0, CI 3.8   History of kidney stones    Hx MRSA infection    Hypokalemia    Hypothyroidism     Iron deficiency anemia 03/27/2018   Lump or mass in breast 11/27/2012   a.) Bx 11/27/2012 --> apocrine wall cyst   Migraine    Nausea & vomiting    Neuropathy    Non-ischemic cardiomyopathy ([place])    a.) TTE 12/09/2016: EF 25-30%, b.) TTE 02/08/2017: EF 25%; c.) Medtronic AICD placed 03/29/2017; d.) TTE 04/25/2017: EF 30-35%; e.) TTE 01/30/2018: EF 45%; f.) TTE 11/06/2018: EF 25%; g.) TTE 05/12/2019: EF 35%; h.) TTE 11/05/2019: EF 20%; I.) TTE 06/17/2020: EF 40%; J.) TTE 06/14/2022: EF 25-30%   Obesity    OSA on CPAP    PAF (paroxysmal atrial fibrillation) (H[facility])    PONV (postoperative nausea and vomiting)    Post-COVID chronic cough    Postcholecystectomy diarrhea    Prolonged Q-T interval on ECG    Restless leg    a.) on ropinirole   Unstable angina ([place])    Past Surgical History:  Procedure Laterality Date   ABDOMINAL HYSTERECTOMY     age 76   BREAST BIOPSY Right 2014   benign   CARDIAC DEFIBRILLATOR PLACEMENT Left 03/29/2017   Procedure: MEDTRONIC CARDIAC DEFIBRILLATOR PLACEMENT (AICD); Location: [facility]; Surgeon: Burnett Sheng, MD   [place]  COLECTOMY     COLONOSCOPY WITH PROPOFOL N/A 02/22/2018   Procedure: COLONOSCOPY WITH PROPOFOL;  Surgeon: Toledo, Benay Pike, MD;  Location: [facility];  Service: Gastroenterology;  Laterality: N/A;   ESOPHAGOGASTRODUODENOSCOPY (EGD) WITH PROPOFOL N/A 02/22/2018   Procedure: ESOPHAGOGASTRODUODENOSCOPY (EGD) WITH PROPOFOL;  Surgeon: Toledo, Benay Pike, MD;  Location: [facility];  Service: Gastroenterology;  Laterality: N/A;   IVC FILTER INSERTION N/A 08/23/2022   Procedure: IVC FILTER INSERTION;  Surgeon: Katha Cabal, MD;  Location: [facility];  Service: Cardiovascular;  Laterality: N/A;   KNEE ARTHROPLASTY Left 08/24/2022   Procedure: COMPUTER ASSISTED TOTAL KNEE ARTHROPLASTY;  Surgeon: Dereck Leep, MD;  Location: [facility];  Service: Orthopedics;  Laterality: Left;   LEFT HEART CATH  AND CORONARY ANGIOGRAPHY Left 10/21/2014   Procedure: LEFT HEART CATH AND CORONARY ANGIOGRAPHY; Location: [facility]; Surgeon: Serafina Royals, MD   NASAL SINUS SURGERY  2012   OOPHORECTOMY     RIGHT/LEFT HEART CATH AND CORONARY ANGIOGRAPHY Bilateral 02/24/2017   Procedure: RIGHT/LEFT HEART CATH AND CORONARY ANGIOGRAPHY; Location: [facility]   THYROIDECTOMY     Social History:  reports that she has never smoked. She has never used smokeless tobacco. She reports that she does not drink alcohol and does not use drugs.  Allergies  Allergen Reactions   Contrast Media [Iodinated Contrast Media] Shortness Of Breath    Per CT report in 2014 pt had break through contrast reaction with hives and SOB following 13 hour prep. MSY    Ferumoxytol Nausea Only   Iron Sucrose Anaphylaxis   Lidocaine Hives   Metrizamide Shortness Of Breath   Penicillins Hives and Other (See Comments)    IgE = 4 (WNL) on 08/12/2022  ediate rash, facial/tongue/throat swelling, SOB or lightheadedness with hypotension, tachy   Sacubitril-Valsartan     Sz (unclear if new start entresto vs hypoglycemia)   Isosorbide Nitrate Other (See Comments)    Headache   Latex Hives   Ondansetron Other (See Comments)    Severe headache   Tizanidine Other (See Comments)    Feels altered   Povidone-Iodine Rash    Blistering rash   Pulmicort [Budesonide] Itching    Family History  Problem Relation Age of Onset   Cancer Mother 67       ovarian   CAD Mother    Cancer Father 38       brain   CAD Father    Cancer Daughter 34       skin   Cancer Maternal Aunt 67       breast   Leukemia Paternal Grandfather     Prior to Admission medications   Medication Sig Start Date End Date Taking? Authorizing Provider  albuterol (ACCUNEB) 1.25 MG/3ML nebulizer solution Take 1 ampule by nebulization every 6 (six) hours as needed for wheezing. Patient not taking: Reported on 08/23/2022    [provider]  albuterol (VENTOLIN HFA) 108 (90 Base)  MCG/ACT inhaler Inhale 1-2 puffs into the lungs every 6 (six) hours as needed for wheezing or shortness of breath. Patient not taking: Reported on 08/23/2022    [provider]  enoxaparin (LOVENOX) 40 MG/0.4ML injection Inject 0.4 mLs (40 mg total) into the skin daily for 14 days. 08/25/22 09/08/22  Tamala Julian B, PA-C  escitalopram (LEXAPRO) 10 MG tablet Take 10 mg by mouth every morning. 12/30/21   [provider]  fentaNYL (DURAGESIC) 75 MCG/HR Place 1 patch onto the skin every 3 (three) days. 01/12/22  [provider]  furosemide (LASIX) 40 MG tablet Take 40 mg by mouth 2 (two) times daily. 02/17/22   [provider]  gabapentin (NEURONTIN) 100 MG capsule Take 100 mg by mouth at bedtime. 06/01/21   [provider]  Lacosamide 100 MG TABS Take 1 tablet by mouth 2 (two) times daily.    [provider]  lanreotide acetate (SOMATULINE DEPOT) 120 MG/0.5ML injection Inject 120 mg into the skin every 28 (twenty-eight) days.    [provider]  levothyroxine (SYNTHROID) 175 MCG tablet Take 175 mcg by mouth every morning. 12/17/21   [provider]  LORazepam (ATIVAN) 1 MG tablet Take 1 tablet (1 mg total) by mouth 4 (four) times daily as needed. 11/10/21   Lorella Nimrod, MD  losartan (COZAAR) 25 MG tablet Take 12.5 mg by mouth 2 (two) times daily. 01/17/22   [provider]  metoprolol succinate (TOPROL-XL) 50 MG 24 hr tablet Take 50 mg by mouth 2 (two) times daily. 02/16/22   [provider]  morphine (MSIR) 15 MG tablet Take 15 mg by mouth every 4 (four) hours as needed. 02/17/22   [provider]  naloxone [facility]) nasal spray 4 mg/0.1 mL Place 1 spray into the nose as needed for opioid reversal. May repeat once in opposite nare. 10/05/21   [provider]  nitroGLYCERIN (NITROSTAT) 0.4 MG SL tablet Place 0.4 mg under the tongue every 5 (five) minutes x 3 doses as needed for chest pain. 11/23/21    [provider]  oxyCODONE-acetaminophen (PERCOCET/ROXICET) 5-325 MG tablet Take 1-2 tablets by mouth every 4 (four) hours as needed (breakthrough pain). 01/17/20   [provider]  Potassium Chloride ER 20 MEQ TBCR Take 1 tablet by mouth in the morning and at bedtime. 02/16/22   [provider]  promethazine (PHENERGAN) 25 MG tablet Take 25 mg by mouth every 4 (four) hours as needed for nausea/vomiting. 12/13/21   [provider]  rOPINIRole (REQUIP) 4 MG tablet Take 4 mg by mouth at bedtime. 01/30/19   [provider]    Physical Exam: Vitals:   09/11/22 1018 09/11/22 1019 09/11/22 1130  BP: 111/73  113/81  Pulse: (!) 114  (!) 101  Resp: (!) 22  18  Temp: 98.7 F (37.1 C)    TempSrc: Oral    SpO2: 94%  96%  Weight:  88.9 kg   Height:  _0  (1.651 m)    Physical Exam Vitals and nursing note reviewed.  Constitutional:      General: She is not in acute distress.    Appearance: She is obese. She is not toxic-appearing.  HENT:     Head: Normocephalic and atraumatic.  Eyes:     Extraocular Movements: Extraocular movements intact.     Pupils: Pupils are equal, round, and reactive to light.  Cardiovascular:     Rate and Rhythm: Normal rate and regular rhythm.     Heart sounds: No murmur heard. Pulmonary:     Effort: Pulmonary effort is normal. No tachypnea or respiratory distress.     Breath sounds: Wheezing (Expiratory wheezing heard primarily in the bilateral upper and middle lung fields) present. No decreased breath sounds, rhonchi or rales.  Abdominal:     Palpations: Abdomen is soft.     Tenderness: There is abdominal tenderness (Diffuse, patient states this is unchanged).  Musculoskeletal:     Right lower leg: No tenderness.     Left lower leg: No tenderness.  Comments: Trace pitting edema of bilateral lower extremities.  No size discrepancy between right and left calf.  Skin:    General: Skin is warm and dry.     Comments:  Left knee incision appears to be healing well with no surrounding erythema.  Neurological:     General: No focal deficit present.     Mental Status: She is alert and oriented to person, place, and time.  Psychiatric:        Mood and Affect: Mood normal.        Behavior: Behavior normal.    Data Reviewed:  CBC with WBC of 6.6, hemoglobin of 10.8, MCV of 93 and platelets of 296 BMP with potassium of 2.9, chloride 95, bicarb 30, glucose 146, creatinine 0.95, calcium of 8.2 and GFR calculated over 60 D-dimer elevated 3.28 BNP within normal limits at 16 Troponin within normal limits at 3 COVID-19, influenza and RSV negative  EKG personally reviewed.  Sinus rhythm with rate of 110.  First-degree AV block.  Compared to prior EKG, no changes noted.  CT CHEST WO CONTRAST  Result Date: 09/11/2022 CLINICAL DATA:  Cough, shortness of breath, recent left knee surgery EXAM: CT CHEST WITHOUT CONTRAST TECHNIQUE: Multidetector CT imaging of the chest was performed following the standard protocol without IV contrast. RADIATION DOSE REDUCTION: This exam was performed according to the departmental dose-optimization program which includes automated exposure control, adjustment of the mA and/or kV according to patient size and/or use of iterative reconstruction technique. COMPARISON:  09/11/2022, 12/16/2022 FINDINGS: Cardiovascular: Unenhanced imaging of the heart is unremarkable without pericardial effusion. Dual lead pacemaker/AICD unchanged. Normal caliber of the thoracic aorta. Evaluation of the vascular lumen is limited without IV contrast. Mediastinum/Nodes: No enlarged mediastinal or axillary lymph nodes. Thyroid gland, trachea, and esophagus demonstrate no significant findings. Lungs/Pleura: No acute airspace disease, effusion, or pneumothorax. Central airways are widely patent. Upper Abdomen: No acute abnormality. Superior extent of an IVC filter incidentally noted. Musculoskeletal: No acute or destructive  bony lesions. Reconstructed images demonstrate no additional findings. IMPRESSION: 1. No acute intrathoracic process. Electronically Signed   By: Randa Ngo M.D.   On: 09/11/2022 15:20   US Venous Img Upper Bilat (DVT)  Result Date: 09/11/2022 CLINICAL DATA:  Upper extremity swelling. EXAM: BILATERAL UPPER EXTREMITY VENOUS DOPPLER ULTRASOUND TECHNIQUE: Gray-scale sonography with graded compression, as well as color Doppler and duplex ultrasound were performed to evaluate the bilateral upper extremity deep venous systems from the level of the subclavian vein and including the jugular, axillary, basilic, radial, ulnar and upper cephalic vein. Spectral Doppler was utilized to evaluate flow at rest and with distal augmentation maneuvers. COMPARISON:  CT chest, concurrent. FINDINGS: RIGHT UPPER EXTREMITY VENOUS Normal compressibility of the RIGHT internal jugular, subclavian, axillary, cephalic, basilic, brachial, radial and ulnar veins. No filling defects to suggest DVT on grayscale or color Doppler imaging. Doppler waveforms show normal direction of venous flow, normal respiratory plasticity and response to augmentation. OTHER No evidence of superficial thrombophlebitis or abnormal fluid collection. Limitations: none LEFT UPPER EXTREMITY VENOUS Normal compressibility of the LEFT internal jugular, subclavian, axillary, cephalic, basilic, brachial, radial and ulnar veins. No filling defects to suggest DVT on grayscale or color Doppler imaging. Doppler waveforms show normal direction of venous flow, normal respiratory plasticity and response to augmentation. OTHER No evidence of superficial thrombophlebitis or abnormal fluid collection. Limitations: none IMPRESSION: No evidence of DVT or superficial thrombophlebitis within either upper extremity. Michaelle Birks, MD [facility] Radiology Electronically  Signed   By: Michaelle Birks M.D.   On: 09/11/2022 15:17   US Venous  Img Lower Bilateral (DVT)  Result Date: 09/11/2022 CLINICAL DATA:  Left lower extremity pain and swelling status post knee replacement. EXAM: BILATERAL LOWER EXTREMITY VENOUS DOPPLER ULTRASOUND TECHNIQUE: Gray-scale sonography with graded compression, as well as color Doppler and duplex ultrasound were performed to evaluate the lower extremity deep venous systems from the level of the common femoral vein and including the common femoral, femoral, profunda femoral, popliteal and calf veins including the posterior tibial, peroneal and gastrocnemius veins when visible. The superficial great saphenous vein was also interrogated. Spectral Doppler was utilized to evaluate flow at rest and with distal augmentation maneuvers in the common femoral, femoral and popliteal veins. COMPARISON:  Lower extremity venous Doppler ultrasound 06/24/2018 and 09/24/2017 FINDINGS: RIGHT LOWER EXTREMITY Common Femoral Vein: No evidence of thrombus. Normal compressibility, respiratory phasicity and response to augmentation. Saphenofemoral Junction: No evidence of thrombus. Normal compressibility and flow on color Doppler imaging. Profunda Femoral Vein: No evidence of thrombus. Normal compressibility and flow on color Doppler imaging. Femoral Vein: No evidence of thrombus. Normal compressibility, respiratory phasicity and response to augmentation. Popliteal Vein: No evidence of thrombus. Normal compressibility, respiratory phasicity and response to augmentation. Calf Veins: No evidence of thrombus. Normal compressibility and flow on color Doppler imaging. Superficial Great Saphenous Vein: No evidence of thrombus. Normal compressibility. Venous Reflux:  None. Other Findings:  None. LEFT LOWER EXTREMITY Common Femoral Vein: No evidence of thrombus. Normal compressibility, respiratory phasicity and response to augmentation. Saphenofemoral Junction: No evidence of thrombus. Normal compressibility and flow on color Doppler imaging. Profunda  Femoral Vein: No evidence of thrombus. Normal compressibility and flow on color Doppler imaging. Femoral Vein: No evidence of thrombus. Normal compressibility, respiratory phasicity and response to augmentation. Popliteal Vein: No evidence of thrombus. Normal compressibility, respiratory phasicity and response to augmentation. Calf Veins: No evidence of thrombus. Normal compressibility and flow on color Doppler imaging. Superficial Great Saphenous Vein: No evidence of thrombus. Normal compressibility. Venous Reflux:  None. Other Findings:  None. IMPRESSION: No evidence of deep venous thrombosis in either lower extremity. Electronically Signed   By: Logan Bores M.D.   On: 09/11/2022 13:45   DG Chest 2 View  Result Date: 09/11/2022 CLINICAL DATA:  Chest pain EXAM: CHEST - 2 VIEW COMPARISON:  May 16, 2022 FINDINGS: Stable AICD device. The heart, hila, mediastinum, lungs, and pleura are otherwise unremarkable. An IVC filter is identified in the upper abdomen. IMPRESSION: No active cardiopulmonary disease. Electronically Signed   By: Dorise Bullion III M.D.   On: 09/11/2022 10:38    Results are pending, will review when available.   Family Communication: Patient's husband updated at bedside  Primary team communication: Discussed case with Dr. Jori Moll  Thank you very much for involving [place] in the care of your patient.  Author: Jose Persia, MD 09/11/2022 4:15 PM  For on call review [email or web address].

## 2022-09-11 NOTE — Assessment & Plan Note (Signed)
Acquired hypothyroidism in the setting of thyroidectomy.  Last free T4 within normal limits.  -Continue home Synthroid

## 2022-09-11 NOTE — Assessment & Plan Note (Signed)
Patient states she has a history of mild intermittent asthma on albuterol only.  On examination today, there is evidence of wheezing concerning for an asthma exacerbation.  - DuoNebs recommended - Consider initiation of prednisone

## 2022-09-11 NOTE — Assessment & Plan Note (Signed)
Patient has a history of hypokalemia treated with oral supplementation.  Potassium today is low at 2.9.  - S/p 10 mEq of potassium IV - One-time dose of K-Dur 40 mEq - Resume home potassium supplementation thereafter

## 2022-09-19 ENCOUNTER — Other Ambulatory Visit: Payer: Self-pay

## 2022-09-19 ENCOUNTER — Encounter: Payer: Self-pay | Admitting: Oncology

## 2022-09-19 ENCOUNTER — Emergency Department: Payer: 59

## 2022-09-19 ENCOUNTER — Observation Stay
Admission: EM | Admit: 2022-09-19 | Discharge: 2022-09-20 | Disposition: A | Discharge status: Home / Self Care | Payer: 59 | Attending: Osteopathic Medicine | Admitting: Osteopathic Medicine

## 2022-09-19 DIAGNOSIS — I480 Paroxysmal atrial fibrillation: Secondary | ICD-10-CM | POA: Diagnosis not present

## 2022-09-19 DIAGNOSIS — E876 Hypokalemia: Secondary | ICD-10-CM | POA: Diagnosis present

## 2022-09-19 DIAGNOSIS — I110 Hypertensive heart disease with heart failure: Secondary | ICD-10-CM | POA: Diagnosis not present

## 2022-09-19 DIAGNOSIS — C7A092 Malignant carcinoid tumor of the stomach: Secondary | ICD-10-CM | POA: Diagnosis not present

## 2022-09-19 DIAGNOSIS — E119 Type 2 diabetes mellitus without complications: Secondary | ICD-10-CM

## 2022-09-19 DIAGNOSIS — E559 Vitamin D deficiency, unspecified: Secondary | ICD-10-CM | POA: Diagnosis not present

## 2022-09-19 DIAGNOSIS — J449 Chronic obstructive pulmonary disease, unspecified: Secondary | ICD-10-CM | POA: Diagnosis present

## 2022-09-19 DIAGNOSIS — Z950 Presence of cardiac pacemaker: Secondary | ICD-10-CM | POA: Insufficient documentation

## 2022-09-19 DIAGNOSIS — D3A8 Other benign neuroendocrine tumors: Secondary | ICD-10-CM | POA: Diagnosis present

## 2022-09-19 DIAGNOSIS — Z91040 Latex allergy status: Secondary | ICD-10-CM | POA: Insufficient documentation

## 2022-09-19 DIAGNOSIS — R9431 Abnormal electrocardiogram [ECG] [EKG]: Secondary | ICD-10-CM | POA: Diagnosis not present

## 2022-09-19 DIAGNOSIS — Z8679 Personal history of other diseases of the circulatory system: Secondary | ICD-10-CM | POA: Diagnosis present

## 2022-09-19 DIAGNOSIS — E039 Hypothyroidism, unspecified: Secondary | ICD-10-CM | POA: Diagnosis present

## 2022-09-19 DIAGNOSIS — I2510 Atherosclerotic heart disease of native coronary artery without angina pectoris: Secondary | ICD-10-CM | POA: Diagnosis not present

## 2022-09-19 DIAGNOSIS — Z8616 Personal history of COVID-19: Secondary | ICD-10-CM | POA: Insufficient documentation

## 2022-09-19 DIAGNOSIS — G40909 Epilepsy, unspecified, not intractable, without status epilepticus: Secondary | ICD-10-CM | POA: Diagnosis present

## 2022-09-19 DIAGNOSIS — I5022 Chronic systolic (congestive) heart failure: Secondary | ICD-10-CM | POA: Diagnosis present

## 2022-09-19 DIAGNOSIS — Z86718 Personal history of other venous thrombosis and embolism: Secondary | ICD-10-CM | POA: Diagnosis not present

## 2022-09-19 DIAGNOSIS — I10 Essential (primary) hypertension: Secondary | ICD-10-CM | POA: Diagnosis present

## 2022-09-19 DIAGNOSIS — Z79899 Other long term (current) drug therapy: Secondary | ICD-10-CM | POA: Diagnosis not present

## 2022-09-19 DIAGNOSIS — F418 Other specified anxiety disorders: Secondary | ICD-10-CM | POA: Diagnosis present

## 2022-09-19 DIAGNOSIS — J45909 Unspecified asthma, uncomplicated: Secondary | ICD-10-CM | POA: Diagnosis not present

## 2022-09-19 DIAGNOSIS — I5042 Chronic combined systolic (congestive) and diastolic (congestive) heart failure: Secondary | ICD-10-CM | POA: Diagnosis not present

## 2022-09-19 DIAGNOSIS — Z96652 Presence of left artificial knee joint: Secondary | ICD-10-CM | POA: Diagnosis not present

## 2022-09-19 DIAGNOSIS — G4733 Obstructive sleep apnea (adult) (pediatric): Secondary | ICD-10-CM

## 2022-09-19 DIAGNOSIS — R569 Unspecified convulsions: Secondary | ICD-10-CM

## 2022-09-19 LAB — COMPREHENSIVE METABOLIC PANEL
ALT: 12 U/L (ref 0–44)
AST: 22 U/L (ref 15–41)
Albumin: 3.8 g/dL (ref 3.5–5.0)
Alkaline Phosphatase: 90 U/L (ref 38–126)
Anion gap: 10 (ref 5–15)
BUN: 10 mg/dL (ref 6–20)
CO2: 31 mmol/L (ref 22–32)
Calcium: 8 mg/dL — ABNORMAL LOW (ref 8.9–10.3)
Chloride: 99 mmol/L (ref 98–111)
Creatinine, Ser: 1.05 mg/dL — ABNORMAL HIGH (ref 0.44–1.00)
GFR, Estimated: >60 mL/min (ref >60)
Glucose, Bld: 131 mg/dL — ABNORMAL HIGH (ref 70–99)
Potassium: 3.1 mmol/L — ABNORMAL LOW (ref 3.5–5.1)
Sodium: 140 mmol/L (ref 135–145)
Total Bilirubin: 0.5 mg/dL (ref 0.3–1.2)
Total Protein: 7.2 g/dL (ref 6.5–8.1)

## 2022-09-19 LAB — CBC WITH DIFFERENTIAL/PLATELET
Abs Immature Granulocytes: 0.03 10*3/uL (ref 0.00–0.07)
Basophils Absolute: 0 10*3/uL (ref 0.0–0.1)
Basophils Relative: 1 %
Eosinophils Absolute: 0.4 10*3/uL (ref 0.0–0.5)
Eosinophils Relative: 6 %
HCT: 34.8 % — ABNORMAL LOW (ref 36.0–46.0)
Hemoglobin: 11.2 g/dL — ABNORMAL LOW (ref 12.0–15.0)
Immature Granulocytes: 1 %
Lymphocytes Relative: 30 %
Lymphs Abs: 1.9 10*3/uL (ref 0.7–4.0)
MCH: 29.9 pg (ref 26.0–34.0)
MCHC: 32.2 g/dL (ref 30.0–36.0)
MCV: 92.8 fL (ref 80.0–100.0)
Monocytes Absolute: 0.4 10*3/uL (ref 0.1–1.0)
Monocytes Relative: 6 %
Neutro Abs: 3.5 10*3/uL (ref 1.7–7.7)
Neutrophils Relative %: 56 %
Platelets: 219 10*3/uL (ref 150–400)
RBC: 3.75 MIL/uL — ABNORMAL LOW (ref 3.87–5.11)
RDW: 14 % (ref 11.5–15.5)
WBC: 6.2 10*3/uL (ref 4.0–10.5)
nRBC: 0 % (ref 0.0–0.2)

## 2022-09-19 LAB — URINE DRUG SCREEN, QUALITATIVE (ARMC ONLY)
Amphetamines, Ur Screen: NOT DETECTED
Barbiturates, Ur Screen: NOT DETECTED
Benzodiazepine, Ur Scrn: POSITIVE — AB
Cannabinoid 50 Ng, Ur ~~LOC~~: NOT DETECTED
Cocaine Metabolite,Ur ~~LOC~~: NOT DETECTED
MDMA (Ecstasy)Ur Screen: NOT DETECTED
Methadone Scn, Ur: NOT DETECTED
Opiate, Ur Screen: POSITIVE — AB
Phencyclidine (PCP) Ur S: NOT DETECTED
Tricyclic, Ur Screen: POSITIVE — AB

## 2022-09-19 LAB — URINALYSIS, ROUTINE W REFLEX MICROSCOPIC
Bilirubin Urine: NEGATIVE
Glucose, UA: NEGATIVE mg/dL
Ketones, ur: NEGATIVE mg/dL
Nitrite: NEGATIVE
Protein, ur: NEGATIVE mg/dL
Specific Gravity, Urine: 1.012 (ref 1.005–1.030)
pH: 5 (ref 5.0–8.0)

## 2022-09-19 LAB — BASIC METABOLIC PANEL
Anion gap: 11 (ref 5–15)
BUN: 11 mg/dL (ref 6–20)
CO2: 28 mmol/L (ref 22–32)
Calcium: 7.5 mg/dL — ABNORMAL LOW (ref 8.9–10.3)
Chloride: 100 mmol/L (ref 98–111)
Creatinine, Ser: 1.09 mg/dL — ABNORMAL HIGH (ref 0.44–1.00)
GFR, Estimated: >60 mL/min (ref >60)
Glucose, Bld: 164 mg/dL — ABNORMAL HIGH (ref 70–99)
Potassium: 3.6 mmol/L (ref 3.5–5.1)
Sodium: 139 mmol/L (ref 135–145)

## 2022-09-19 LAB — MAGNESIUM
Magnesium: 2.2 mg/dL (ref 1.7–2.4)
Magnesium: 2.1 mg/dL (ref 1.7–2.4)

## 2022-09-19 LAB — CBG MONITORING, ED: Glucose-Capillary: 116 mg/dL — ABNORMAL HIGH (ref 70–99)

## 2022-09-19 LAB — PHOSPHORUS: Phosphorus: 6.3 mg/dL — ABNORMAL HIGH (ref 2.5–4.6)

## 2022-09-19 LAB — ETHANOL: Alcohol, Ethyl (B): <10 mg/dL (ref <10)

## 2022-09-19 LAB — BRAIN NATRIURETIC PEPTIDE: B Natriuretic Peptide: 45.8 pg/mL (ref 0.0–100.0)

## 2022-09-19 MED ORDER — HYDRALAZINE HCL 20 MG/ML IJ SOLN: 5.0000 mg | INTRAMUSCULAR | Status: DC | PRN

## 2022-09-19 MED ORDER — POTASSIUM CHLORIDE CRYS ER 20 MEQ PO TBCR
40.0000 meq | EXTENDED_RELEASE_TABLET | ORAL | Status: AC
  Administered 2022-09-19 (×2): 40 meq via ORAL
  Filled 2022-09-19 (×2): qty 2

## 2022-09-19 MED ORDER — DM-GUAIFENESIN ER 30-600 MG PO TB12: 1.0000 | ORAL_TABLET | Freq: Two times a day (BID) | ORAL | Status: DC | PRN

## 2022-09-19 MED ORDER — GABAPENTIN 300 MG PO CAPS
300.0000 mg | ORAL_CAPSULE | Freq: Every day | ORAL | Status: DC
  Administered 2022-09-19: 300 mg via ORAL
  Filled 2022-09-19: qty 1

## 2022-09-19 MED ORDER — SODIUM CHLORIDE 0.9 % IV SOLN
200.0000 mg | Freq: Once | INTRAVENOUS | Status: AC
  Administered 2022-09-19: 200 mg via INTRAVENOUS
  Filled 2022-09-19: qty 20

## 2022-09-19 MED ORDER — KETOROLAC TROMETHAMINE 30 MG/ML IJ SOLN
30.0000 mg | Freq: Once | INTRAMUSCULAR | Status: AC
  Administered 2022-09-19: 30 mg via INTRAVENOUS
  Filled 2022-09-19: qty 1

## 2022-09-19 MED ORDER — SODIUM CHLORIDE 0.9 % IV SOLN: INTRAVENOUS | Status: DC

## 2022-09-19 MED ORDER — NITROGLYCERIN 0.4 MG SL SUBL: 0.4000 mg | SUBLINGUAL_TABLET | SUBLINGUAL | Status: DC | PRN

## 2022-09-19 MED ORDER — LORAZEPAM 2 MG/ML PO CONC
2.0000 mg | ORAL | Status: AC
  Administered 2022-09-19: 2 mg via ORAL
  Filled 2022-09-19: qty 1

## 2022-09-19 MED ORDER — ALBUTEROL SULFATE (2.5 MG/3ML) 0.083% IN NEBU: 2.5000 mg | INHALATION_SOLUTION | RESPIRATORY_TRACT | Status: DC | PRN

## 2022-09-19 MED ORDER — ACETAMINOPHEN 325 MG PO TABS
650.0000 mg | ORAL_TABLET | Freq: Four times a day (QID) | ORAL | Status: DC | PRN
  Administered 2022-09-20: 650 mg via ORAL
  Filled 2022-09-19: qty 2

## 2022-09-19 MED ORDER — SODIUM CHLORIDE 0.9 % IV SOLN
100.0000 mg | Freq: Once | INTRAVENOUS | Status: DC
  Filled 2022-09-19: qty 10

## 2022-09-19 MED ORDER — ACETAMINOPHEN 10 MG/ML IV SOLN
1000.0000 mg | Freq: Once | INTRAVENOUS | Status: AC
  Administered 2022-09-19: 1000 mg via INTRAVENOUS
  Filled 2022-09-19 (×2): qty 100

## 2022-09-19 MED ORDER — LORAZEPAM 2 MG/ML IJ SOLN
2.0000 mg | INTRAMUSCULAR | Status: DC | PRN
  Administered 2022-09-19 – 2022-09-20 (×2): 2 mg via INTRAVENOUS
  Filled 2022-09-19 (×2): qty 1

## 2022-09-19 MED ORDER — POTASSIUM CHLORIDE 10 MEQ/100ML IV SOLN
10.0000 meq | INTRAVENOUS | Status: AC
  Administered 2022-09-19 (×2): 10 meq via INTRAVENOUS
  Filled 2022-09-19 (×2): qty 100

## 2022-09-19 MED ORDER — LEVETIRACETAM IN NACL 1000 MG/100ML IV SOLN: 1000.0000 mg | Freq: Once | INTRAVENOUS | Status: DC

## 2022-09-19 MED ORDER — ACETAMINOPHEN 500 MG PO TABS: 1000.0000 mg | ORAL_TABLET | Freq: Once | ORAL | Status: DC

## 2022-09-19 MED ORDER — LEVOTHYROXINE SODIUM 50 MCG PO TABS
175.0000 ug | ORAL_TABLET | Freq: Every day | ORAL | Status: DC
  Administered 2022-09-19 – 2022-09-20 (×2): 175 ug via ORAL
  Filled 2022-09-19 (×2): qty 4

## 2022-09-19 MED ORDER — MORPHINE SULFATE 15 MG PO TABS
15.0000 mg | ORAL_TABLET | ORAL | Status: DC | PRN
  Administered 2022-09-19 (×2): 15 mg via ORAL
  Filled 2022-09-19 (×2): qty 1

## 2022-09-19 MED ORDER — FUROSEMIDE 40 MG PO TABS
40.0000 mg | ORAL_TABLET | Freq: Two times a day (BID) | ORAL | Status: DC
  Administered 2022-09-19 – 2022-09-20 (×3): 40 mg via ORAL
  Filled 2022-09-19 (×3): qty 1

## 2022-09-19 MED ORDER — GABAPENTIN 100 MG PO CAPS: 100.0000 mg | ORAL_CAPSULE | Freq: Every day | ORAL | Status: DC

## 2022-09-19 MED ORDER — DIPHENHYDRAMINE HCL 50 MG/ML IJ SOLN
12.5000 mg | Freq: Three times a day (TID) | INTRAMUSCULAR | Status: DC | PRN
  Administered 2022-09-19 (×2): 12.5 mg via INTRAVENOUS
  Filled 2022-09-19 (×2): qty 1

## 2022-09-19 MED ORDER — HYDROXYZINE HCL 50 MG/ML IM SOLN: 25.0000 mg | Freq: Three times a day (TID) | INTRAMUSCULAR | Status: DC | PRN

## 2022-09-19 MED ORDER — OXYCODONE-ACETAMINOPHEN 5-325 MG PO TABS
1.0000 | ORAL_TABLET | ORAL | Status: DC | PRN
  Administered 2022-09-19: 2 via ORAL
  Administered 2022-09-19: 1 via ORAL
  Administered 2022-09-19: 2 via ORAL
  Administered 2022-09-20: 1 via ORAL
  Filled 2022-09-19: qty 2
  Filled 2022-09-19: qty 1
  Filled 2022-09-19 (×2): qty 2

## 2022-09-19 MED ORDER — CYCLOBENZAPRINE HCL 10 MG PO TABS: 5.0000 mg | ORAL_TABLET | Freq: Four times a day (QID) | ORAL | Status: DC | PRN

## 2022-09-19 MED ORDER — SODIUM CHLORIDE 0.9 % IV SOLN: 2000.0000 mg | Freq: Once | INTRAVENOUS | Status: DC

## 2022-09-19 MED ORDER — LACOSAMIDE 50 MG PO TABS
100.0000 mg | ORAL_TABLET | Freq: Two times a day (BID) | ORAL | Status: DC
  Administered 2022-09-19 – 2022-09-20 (×2): 100 mg via ORAL
  Filled 2022-09-19 (×2): qty 2

## 2022-09-19 NOTE — H&P (Addendum)
History and Physical    Brittany Nguyen UGQ:[id number] DOB: Jan 31, 1966 DOA: 09/19/2022  Referring MD/NP/PA:   PCP: [name], MD   Patient coming from:  The patient is coming from home.    Chief Complaint: seizure  HPI: Brittany Nguyen is a 57 y.o. female with medical history significant of seizure, QTc prolongation, hypertension, diet-controlled diabetes, COPD/asthma, CAD, myocardial infarction, CHF with EF of 45%, AICD, hypothyroidism, GERD, depression with anxiety, DVT not on anticoagulants (s/p of IVC filter placement per pt), carcinoid tumor of the stomach, chronic pain, who presents with seizure.  Patient has history of seizure, used to be on Keppra which was discontinued 3 months ago due to QTc prolongation.  Patient is currently taking gabapentin and Vimpat 100 mg bid. Per report, pt had seizures at home that were generalized tonic-clonic for approximately 20 minutes per EMS. Pt was given 5 mg of IM Versed. She had not had any generalized tonic-clonic movements. On arrival in the ED she has some abnormal movements of the head but no other seizure-like activity. Pt was initially in postictal status.  Her mental status has improved in ED.  When I saw patient in ED, patient is mildly drowsy, but she is alert, oriented x 3.  Patient answered all questions appropriately.  Patient denies any chest pain, cough, shortness breath.  No nausea, vomiting, diarrhea or abdominal pain.  No symptoms of UTI.  Patient recently was hospitalized from 12/20 - 12/21 and had left knee replacement.  Patient has some pain in the left knee.   Data reviewed independently and ED Course: pt was found to have WBC 6.2, GFR> 60, potassium 3.1, magnesium 2.2, temperature normal, soft blood pressure 91/66, heart rate 78, RR 19, oxygen saturation 93% on room air.  CT of head is negative for acute intracranial abnormalities but showed left lateral scalp hematoma, CT of C-spine is negative for acute injury.   Patient is placed on PCU for observation.  Dr. Curly Shores of neurology is consulted.   EKG: I have personally reviewed.  Sinus rhythm, QTc 614, low voltage, LAD   Review of Systems:   General: no fevers, chills, no body weight gain, has fatigue HEENT: no blurry vision, hearing changes or sore throat Respiratory: no dyspnea, coughing, wheezing CV: no chest pain, no palpitations GI: no nausea, vomiting, abdominal pain, diarrhea, constipation GU: no dysuria, burning on urination, increased urinary frequency, hematuria  Ext: no leg edema Neuro: no unilateral weakness, numbness, or tingling, no vision change or hearing loss. Has seizure Skin: no rash, no skin tear. MSK: No muscle spasm, no deformity, no limitation of range of movement in spin Heme: No easy bruising.  Travel history: No recent long distant travel.   Allergy:  Allergies  Allergen Reactions   Contrast Media [Iodinated Contrast Media] Shortness Of Breath    Per CT report in 2014 pt had break through contrast reaction with hives and SOB following 13 hour prep. MSY    Ferumoxytol Nausea Only   Iron Sucrose Anaphylaxis   Lidocaine Hives   Metrizamide Shortness Of Breath   Penicillins Hives and Other (See Comments)    IgE = 4 (WNL) on 08/12/2022  ediate rash, facial/tongue/throat swelling, SOB or lightheadedness with hypotension, tachy   Sacubitril-Valsartan     Sz (unclear if new start entresto vs hypoglycemia)   Isosorbide Nitrate Other (See Comments)    Headache   Latex Hives   Ondansetron Other (See Comments)    Severe headache   Tizanidine Other (  See Comments)    Feels altered   Povidone-Iodine Rash    Blistering rash   Pulmicort [Budesonide] Itching    Past Medical History:  Diagnosis Date   Acute anterolateral wall MI ([place]) 08/2016   AICD (automatic cardioverter/defibrillator) present 03/29/2017   a.) Medtronic Evera MRI XT DR SureScan (SN: [id number])   Anxiety    Arthritis    Asthma 2013   C. 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[place])    a.) on lacosamide; b.) last seizure 07-30-22 in setting of hypokalemia (K+ 2.6)   GERD (gastroesophageal reflux disease)    GIB (gastrointestinal bleeding) 01/08/2018   HFrEF (heart failure with reduced ejection fraction) ([place])    a.)TTE 12/09/16: EF 25-30%, dif HK; b.) TTE 02/08/17: EF 25%, dif HK, mild TR, G1DD; c.) TTE 04/25/17: EF 30-35%, mild LVH; d.) TTE 01/30/18: EF 45%, MAC, LAE, mild MR, G1DD; e.) TTE 11/06/2018: EF 25%, sev glob HK, triv MR/TR/PR; f.) TTE 05/12/19: EF 35%, MAC, AoV sclerosis, G1DD; g.) TTE 11/05/19: EF 20%, sev glob HK, triv MR; h.) TTE 06/17/20: EF 40%, mod glob HK, triv MR; I:) TTE 06/14/22: EF 25-30%, G1DD   History of cardiac catheterization    a.) LHC 10/21/2014: normal coronaries; b.) R/LHC 02/24/2017: normal coronaries, mRA 12, mPA 28, mPCWP 15, CO 8.0, CI 3.8   History of kidney stones    Hx MRSA infection    Hypokalemia    Hypothyroidism    Iron deficiency anemia 03/27/2018   Lump or mass in breast 11/27/2012   a.) Bx 11/27/2012 --> apocrine wall cyst   Migraine    Nausea & vomiting    Neuropathy    Non-ischemic cardiomyopathy ([place])    a.) TTE 12/09/2016: EF 25-30%, b.) TTE 02/08/2017: EF 25%; c.) Medtronic AICD placed 03/29/2017; d.) TTE 04/25/2017: EF 30-35%; e.) TTE 01/30/2018: EF 45%; f.) TTE 11/06/2018: EF 25%; g.) TTE 05/12/2019: EF 35%; h.) TTE 11/05/2019: EF 20%; I.) TTE 06/17/2020: EF 40%; J.) TTE 06/14/2022: EF  25-30%   Obesity    OSA on CPAP    PAF (paroxysmal atrial fibrillation) (HCC)    PONV (postoperative nausea and vomiting)    Post-COVID chronic cough    Postcholecystectomy diarrhea    Prolonged Q-T interval on ECG    Restless leg    a.) on ropinirole   Unstable angina ([place])     Past Surgical History:  Procedure Laterality Date   ABDOMINAL HYSTERECTOMY     age 47   BREAST BIOPSY Right 2014   benign   CARDIAC DEFIBRILLATOR PLACEMENT Left 03/29/2017   Procedure: MEDTRONIC CARDIAC DEFIBRILLATOR PLACEMENT (AICD); Location: [facility]; Surgeon: Burnett Sheng, MD   CESAREAN SECTION     CHOLECYSTECTOMY     COLECTOMY     COLONOSCOPY WITH PROPOFOL N/A 02/22/2018   Procedure: COLONOSCOPY WITH PROPOFOL;  Surgeon: Toledo, Benay Pike, MD;  Location: [facility];  Service: Gastroenterology;  Laterality: N/A;   ESOPHAGOGASTRODUODENOSCOPY (EGD) WITH PROPOFOL N/A 02/22/2018  Procedure: ESOPHAGOGASTRODUODENOSCOPY (EGD) WITH PROPOFOL;  Surgeon: Toledo, Benay Pike, MD;  Location: [facility];  Service: Gastroenterology;  Laterality: N/A;   IVC FILTER INSERTION N/A 08/23/2022   Procedure: IVC FILTER INSERTION;  Surgeon: Katha Cabal, MD;  Location: [facility];  Service: Cardiovascular;  Laterality: N/A;   KNEE ARTHROPLASTY Left 08/24/2022   Procedure: COMPUTER ASSISTED TOTAL KNEE ARTHROPLASTY;  Surgeon: Dereck Leep, MD;  Location: [facility];  Service: Orthopedics;  Laterality: Left;   LEFT HEART CATH AND CORONARY ANGIOGRAPHY Left 10/21/2014   Procedure: LEFT HEART CATH AND CORONARY ANGIOGRAPHY; Location: [facility]; Surgeon: Serafina Royals, MD   NASAL SINUS SURGERY  2012   OOPHORECTOMY     RIGHT/LEFT HEART CATH AND CORONARY ANGIOGRAPHY Bilateral 02/24/2017   Procedure: RIGHT/LEFT HEART CATH AND CORONARY ANGIOGRAPHY; Location: [facility]   THYROIDECTOMY      Social History:  reports that she has never smoked. She has never used smokeless tobacco. She reports that she does not drink alcohol and  does not use drugs.  Family History:  Family History  Problem Relation Age of Onset   Cancer Mother 75       ovarian   CAD Mother    Cancer Father 66       brain   CAD Father    Cancer Daughter 8       skin   Cancer Maternal Aunt 31       breast   Leukemia Paternal Grandfather      Prior to Admission medications   Medication Sig Start Date End Date Taking? Authorizing Provider  cyclobenzaprine (FLEXERIL) 5 MG tablet PLEASE SEE ATTACHED FOR DETAILED DIRECTIONS 08/31/22  Yes [provider]  acetaminophen (TYLENOL) 500 MG tablet Take 2 tablets (1,000 mg total) by mouth every 6 (six) hours as needed. 09/11/22 09/11/23  Teodoro Spray, PA  albuterol (ACCUNEB) 1.25 MG/3ML nebulizer solution Take 1 ampule by nebulization every 6 (six) hours as needed for wheezing. Patient not taking: Reported on 08/23/2022    [provider]  albuterol (VENTOLIN HFA) 108 (90 Base) MCG/ACT inhaler Inhale 1-2 puffs into the lungs every 6 (six) hours as needed for wheezing or shortness of breath. Patient not taking: Reported on 08/23/2022    [provider]  benzonatate (TESSALON PERLES) 100 MG capsule Take 1 capsule (100 mg total) by mouth 3 (three) times daily as needed for cough. 09/11/22 09/11/23  Teodoro Spray, PA  enoxaparin (LOVENOX) 40 MG/0.4ML injection Inject 0.4 mLs (40 mg total) into the skin daily for 14 days. 08/25/22 09/08/22  Tamala Julian B, PA-C  escitalopram (LEXAPRO) 10 MG tablet Take 10 mg by mouth every morning. 12/30/21   [provider]  fentaNYL (DURAGESIC) 75 MCG/HR Place 1 patch onto the skin every 3 (three) days. 01/12/22   [provider]  furosemide (LASIX) 40 MG tablet Take 40 mg by mouth 2 (two) times daily. 02/17/22   [provider]  gabapentin (NEURONTIN) 100 MG capsule Take 100 mg by mouth at bedtime. 06/01/21   [provider]  Lacosamide 100 MG TABS Take 1 tablet by mouth 2 (two) times daily.    [provider]  lanreotide acetate (SOMATULINE DEPOT) 120 MG/0.5ML injection Inject 120 mg into the skin every 28 (twenty-eight) days.    [provider]  levothyroxine (SYNTHROID) 175 MCG tablet Take 175 mcg by mouth every morning. 12/17/21   [provider]  LORazepam (ATIVAN) 1 MG tablet Take 1 tablet (1 mg  total) by mouth 4 (four) times daily as needed. 11/10/21   Lorella Nimrod, MD  losartan (COZAAR) 25 MG tablet Take 12.5 mg by mouth 2 (two) times daily. 01/17/22   [provider]  metoprolol succinate (TOPROL-XL) 50 MG 24 hr tablet Take 50 mg by mouth 2 (two) times daily. 02/16/22   [provider]  morphine (MSIR) 15 MG tablet Take 15 mg by mouth every 4 (four) hours as needed. 02/17/22   [provider]  naloxone [facility]) nasal spray 4 mg/0.1 mL Place 1 spray into the nose as needed for opioid reversal. May repeat once in opposite nare. 10/05/21   [provider]  nitroGLYCERIN (NITROSTAT) 0.4 MG SL tablet Place 0.4 mg under the tongue every 5 (five) minutes x 3 doses as needed for chest pain. 11/23/21   [provider]  oxyCODONE-acetaminophen (PERCOCET/ROXICET) 5-325 MG tablet Take 1-2 tablets by mouth every 4 (four) hours as needed (breakthrough pain). 01/17/20   [provider]  Potassium Chloride ER 20 MEQ TBCR Take 1 tablet by mouth in the morning and at bedtime. 02/16/22   [provider]  predniSONE (DELTASONE) 10 MG tablet Take 5 tablets (50 mg total) by mouth daily with breakfast for 2 days, THEN 4 tablets (40 mg total) daily with breakfast for 2 days, THEN 3 tablets (30 mg total) daily with breakfast for 2 days, THEN 2 tablets (20 mg total) daily with breakfast for 2 days, THEN 1 tablet (10 mg total) daily with breakfast for 2 days. 09/11/22 09/21/22  Teodoro Spray, PA  promethazine (PHENERGAN) 25 MG tablet Take 25 mg by mouth every 4 (four) hours as needed for nausea/vomiting. 12/13/21   [provider]   rOPINIRole (REQUIP) 4 MG tablet Take 4 mg by mouth at bedtime. 01/30/19   [provider]    Physical Exam: Vitals:   09/19/22 0535 09/19/22 0537 09/19/22 0645  BP:  104/71 91/66  Pulse:  78 70  Resp:  19 16  Temp:  98.7 F (37.1 C)   TempSrc:  Axillary   SpO2: 95% 93% 97%   General: Not in acute distress HEENT:       Eyes: PERRL, EOMI, no scleral icterus.       ENT: No discharge from the ears and nose, no pharynx injection, no tonsillar enlargement.        Neck: No JVD, no bruit, no mass felt. Heme: No neck lymph node enlargement. Cardiac: S1/S2, RRR, No murmurs, No gallops or rubs. Respiratory: No rales, wheezing, rhonchi or rubs. GI: Soft, nondistended, nontender, no rebound pain, no organomegaly, BS present. GU: No hematuria Ext: No pitting leg edema bilaterally. 1+DP/PT pulse bilaterally. Musculoskeletal: No joint deformities, No joint redness or warmth, no limitation of ROM in spin. Skin: No rashes.  Neuro: Mildly drowsy, oriented X3, cranial nerves II-XII grossly intact, moves all extremities normally. Psych: Patient is not psychotic, no suicidal or hemocidal ideation.  Labs on Admission: I have personally reviewed following labs and imaging studies  CBC: Recent Labs  Lab 09/19/22 0548  WBC 6.2  NEUTROABS 3.5  HGB 11.2*  HCT 34.8*  MCV 92.8  PLT 706   Basic Metabolic Panel: Recent Labs  Lab 09/19/22 0548  NA 140  K 3.1*  CL 99  CO2 31  GLUCOSE 131*  BUN 10  CREATININE 1.05*  CALCIUM 8.0*  MG 2.2  PHOS 6.3*   GFR: Estimated Creatinine Clearance: 67.5 mL/min (A) (by C-G formula based on SCr of  1.05 mg/dL (H)). Liver Function Tests: Recent Labs  Lab 09/19/22 0548  AST 22  ALT 12  ALKPHOS 90  BILITOT 0.5  PROT 7.2  ALBUMIN 3.8   No results for input(s): "LIPASE", "AMYLASE" in the last 168 hours. No results for input(s): "AMMONIA" in the last 168 hours. Coagulation Profile: No results for input(s): "INR", "PROTIME" in the last 168  hours. Cardiac Enzymes: No results for input(s): "CKTOTAL", "CKMB", "CKMBINDEX", "TROPONINI" in the last 168 hours. BNP (last 3 results) No results for input(s): "PROBNP" in the last 8760 hours. HbA1C: No results for input(s): "HGBA1C" in the last 72 hours. CBG: Recent Labs  Lab 09/19/22 0535  GLUCAP 116*   Lipid Profile: No results for input(s): "CHOL", "HDL", "LDLCALC", "TRIG", "CHOLHDL", "LDLDIRECT" in the last 72 hours. Thyroid Function Tests: No results for input(s): "TSH", "T4TOTAL", "FREET4", "T3FREE", "THYROIDAB" in the last 72 hours. Anemia Panel: No results for input(s): "VITAMINB12", "FOLATE", "FERRITIN", "TIBC", "IRON", "RETICCTPCT" in the last 72 hours. Urine analysis:    Component Value Date/Time   COLORURINE STRAW (A) 08/12/2022 1145   APPEARANCEUR CLEAR (A) 08/12/2022 1145   APPEARANCEUR Clear 09/02/2014 2312   LABSPEC 1.008 08/12/2022 1145   LABSPEC 1.013 09/02/2014 2312   PHURINE 6.0 08/12/2022 1145   GLUCOSEU NEGATIVE 08/12/2022 1145   GLUCOSEU Negative 09/02/2014 2312   HGBUR NEGATIVE 08/12/2022 1145   BILIRUBINUR NEGATIVE 08/12/2022 1145   BILIRUBINUR Negative 09/02/2014 2312   KETONESUR NEGATIVE 08/12/2022 1145   PROTEINUR NEGATIVE 08/12/2022 1145   NITRITE NEGATIVE 08/12/2022 1145   LEUKOCYTESUR TRACE (A) 08/12/2022 1145   LEUKOCYTESUR Negative 09/02/2014 2312   Sepsis Labs: _0 (procalcitonin:4,lacticidven:4) ) Recent Results (from the past 240 hour(s))  Resp panel by RT-PCR (RSV, Flu A&B, Covid) Anterior Nasal Swab     Status: None   Collection Time: 09/11/22 10:24 AM   Specimen: Anterior Nasal Swab  Result Value Ref Range Status   SARS Coronavirus 2 by RT PCR NEGATIVE NEGATIVE Final    Comment: (NOTE) SARS-CoV-2 target nucleic acids are NOT DETECTED.  The SARS-CoV-2 RNA is generally detectable in upper respiratory specimens during the acute phase of infection. The lowest concentration of SARS-CoV-2 viral copies this assay can detect  is 138 copies/mL. A negative result does not preclude SARS-Cov-2 infection and should not be used as the sole basis for treatment or other patient management decisions. A negative result may occur with  improper specimen collection/handling, submission of specimen other than nasopharyngeal swab, presence of viral mutation(s) within the areas targeted by this assay, and inadequate number of viral copies(<138 copies/mL). A negative result must be combined with clinical observations, patient history, and epidemiological information. The expected result is Negative.  Fact Sheet for Patients:  EntrepreneurPulse.com.au  Fact Sheet for Healthcare Providers:  IncredibleEmployment.be  This test is no t yet approved or cleared by the [place] FDA and  has been authorized for detection and/or diagnosis of SARS-CoV-2 by FDA under an Emergency Use Authorization (EUA). This EUA will remain  in effect (meaning this test can be used) for the duration of the COVID-19 declaration under Section 564(b)(1) of the Act, 21 U.S.C.section 360bbb-3(b)(1), unless the authorization is terminated  or revoked sooner.       Influenza A by PCR NEGATIVE NEGATIVE Final   Influenza B by PCR NEGATIVE NEGATIVE Final    Comment: (NOTE) The Xpert Xpress SARS-CoV-2/FLU/RSV plus assay is intended as an aid in the diagnosis of influenza from Nasopharyngeal swab specimens and should not be used as  a sole basis for treatment. Nasal washings and aspirates are unacceptable for Xpert Xpress SARS-CoV-2/FLU/RSV testing.  Fact Sheet for Patients: EntrepreneurPulse.com.au  Fact Sheet for Healthcare Providers: IncredibleEmployment.be  This test is not yet approved or cleared by the [place] FDA and has been authorized for detection and/or diagnosis of SARS-CoV-2 by FDA under an Emergency Use Authorization (EUA). This EUA will remain in effect  (meaning this test can be used) for the duration of the COVID-19 declaration under Section 564(b)(1) of the Act, 21 U.S.C. section 360bbb-3(b)(1), unless the authorization is terminated or revoked.     Resp Syncytial Virus by PCR NEGATIVE NEGATIVE Final    Comment: (NOTE) Fact Sheet for Patients: EntrepreneurPulse.com.au  Fact Sheet for Healthcare Providers: IncredibleEmployment.be  This test is not yet approved or cleared by the [place] FDA and has been authorized for detection and/or diagnosis of SARS-CoV-2 by FDA under an Emergency Use Authorization (EUA). This EUA will remain in effect (meaning this test can be used) for the duration of the COVID-19 declaration under Section 564(b)(1) of the Act, 21 U.S.C. section 360bbb-3(b)(1), unless the authorization is terminated or revoked.  Performed at [facility], [place], [place] [address]   Respiratory (~20 pathogens) panel by PCR     Status: None   Collection Time: 09/11/22  2:16 PM   Specimen: Nasopharyngeal Swab; Respiratory  Result Value Ref Range Status   Adenovirus NOT DETECTED NOT DETECTED Final   Coronavirus 229E NOT DETECTED NOT DETECTED Final    Comment: (NOTE) The Coronavirus on the Respiratory Panel, DOES NOT test for the novel  Coronavirus (2019 nCoV)    Coronavirus HKU1 NOT DETECTED NOT DETECTED Final   Coronavirus NL63 NOT DETECTED NOT DETECTED Final   Coronavirus OC43 NOT DETECTED NOT DETECTED Final   Metapneumovirus NOT DETECTED NOT DETECTED Final   Rhinovirus / Enterovirus NOT DETECTED NOT DETECTED Final   Influenza A NOT DETECTED NOT DETECTED Final   Influenza B NOT DETECTED NOT DETECTED Final   Parainfluenza Virus 1 NOT DETECTED NOT DETECTED Final   Parainfluenza Virus 2 NOT DETECTED NOT DETECTED Final   Parainfluenza Virus 3 NOT DETECTED NOT DETECTED Final   Parainfluenza Virus 4 NOT DETECTED NOT DETECTED Final   Respiratory Syncytial  Virus NOT DETECTED NOT DETECTED Final   Bordetella pertussis NOT DETECTED NOT DETECTED Final   Bordetella Parapertussis NOT DETECTED NOT DETECTED Final   Chlamydophila pneumoniae NOT DETECTED NOT DETECTED Final   Mycoplasma pneumoniae NOT DETECTED NOT DETECTED Final    Comment: Performed at [facility], [place]. [address] [place], [place] [address]     Radiological Exams on Admission: CT Cervical Spine Wo Contrast  Result Date: 09/19/2022 CLINICAL DATA:  57 year old female status post witness seizure. Postictal. Headache. EXAM: CT CERVICAL SPINE WITHOUT CONTRAST TECHNIQUE: Multidetector CT imaging of the cervical spine was performed without intravenous contrast. Multiplanar CT image reconstructions were also generated. RADIATION DOSE REDUCTION: This exam was performed according to the departmental dose-optimization program which includes automated exposure control, adjustment of the mA and/or kV according to patient size and/or use of iterative reconstruction technique. COMPARISON:  Head CT today.  Neck CT 12/06/2020. FINDINGS: Alignment: Chronic straightening of cervical lordosis is stable since 2022. Cervicothoracic junction alignment is within normal limits. Bilateral posterior element alignment is within normal limits. Skull base and vertebrae: Visualized skull base is intact. No atlanto-occipital dissociation. C1 and C2 appear stable and intact, aligned. No acute osseous abnormality identified. Soft tissues and spinal  canal: No prevertebral fluid or swelling. No visible canal hematoma. Partially retropharyngeal course of both carotids more so the left. Mild larynx motion artifact. Surgically absent thyroid. Otherwise negative visible noncontrast neck soft tissues. Chronic carious posterior mandible dentition. Disc levels: Chronic disc bulging. But mild for age degeneration overall. No cervical spinal stenosis suspected. Upper chest: Partially visible left chest pacemaker. Stable lung apices with  mild scarring. Visible upper thoracic levels appear intact. IMPRESSION: No acute traumatic injury identified in the cervical spine. Electronically Signed   By: Genevie Ann M.D.   On: 09/19/2022 06:27   CT HEAD WO CONTRAST (5MM)  Result Date: 09/19/2022 CLINICAL DATA:  57 year old female status post witness seizure. Postictal. Headache. EXAM: CT HEAD WITHOUT CONTRAST TECHNIQUE: Contiguous axial images were obtained from the base of the skull through the vertex without intravenous contrast. RADIATION DOSE REDUCTION: This exam was performed according to the departmental dose-optimization program which includes automated exposure control, adjustment of the mA and/or kV according to patient size and/or use of iterative reconstruction technique. COMPARISON:  Brain MRI 06/22/2018.  Head CT 07/30/2022. FINDINGS: Brain: Cerebral volume is stable and within normal limits. No midline shift, ventriculomegaly, mass effect, evidence of mass lesion, intracranial hemorrhage or evidence of cortically based acute infarction. Gray-white matter differentiation is within normal limits throughout the brain. No encephalomalacia identified. Vascular: No suspicious intracranial vascular hyperdensity. Skull: No acute osseous abnormality identified. Hyperostosis of the calvarium, normal variant. Sinuses/Orbits: Left sphenoid and ethmoid sinus aeration has improved since November. Residual bubbly opacity in the right frontal sinus. Incidental left frontoethmoidal sinus osteoma (chronic). Tympanic cavities and mastoids appear clear. Other: Mild left lateral scalp hematoma or contusion series 3, image 58. No scalp soft tissue gas. No orbits soft tissue injury identified. IMPRESSION: 1. Mild left lateral scalp hematoma or contusion. No underlying skull fracture. 2. Stable and normal for age noncontrast CT appearance of the brain. 3. Chronic paranasal sinus disease, mildly improved since November. Electronically Signed   By: Genevie Ann M.D.   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  CT of head is negative for acute intracranial abnormalities.  Etiology is not clear.  Patient has mild hypokalemia which will be repleted.  Pt was given 200 mg of Vimpat and 2 mg of ativan in ED. Consulted Dr. Curly Shores of neurology.  -Place in PCU for obs -Seizure precaution -When necessary Ativan for seizure -Continue Home medications: Vimpat 100 mg twice daily and gabapentin increased to 300 mg daily per Dr. Curly Shores -Follow-up urinalysis  Hypokalemia: Potassium 3.1.  Magnesium 2.2 -Repleted potassium -Check phosphorus level  Prolonged QT interval: QTc 614.  Heart rate is 78 -Avoid using QTc prolongation medication, such as Zofran.  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  CHF seem to be compensated.  2D echo 5/28 8/19 showed EF of 45% -Continue home Lasix 40 mg twice daily  Diet controlled type 2 diabetes mellitus without complication ([place]): Recent A1c 6.3, well-controlled.  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  Body weight 88.9. -Exercise and healthy diet -Encourage losing weight    DVT ppx: SCD  Code Status: Full code  Family Communication:  Yes, patient's husband by phone  Disposition Plan:  Anticipate discharge back to previous environment  Consults called:    Admission status and Level of care: Progressive:   for obs   Dispo: The patient is from: Home              Anticipated d/c is to: Home              Anticipated d/c date is: 1 day              Patient currently is not medically stable to d/c.    Severity of Illness:  The appropriate patient status for this patient is OBSERVATION. Observation status is judged to be reasonable and necessary in order to provide the required intensity of service to ensure the patient's safety. The patient's presenting symptoms, physical exam findings, and initial radiographic and laboratory data in the context of their medical condition is felt to place them at decreased risk for further clinical deterioration. Furthermore, it is anticipated that the patient will be medically stable for discharge from the hospital within 2 midnights of admission.        Date of Service 09/19/2022    [facility]   If 7PM-7AM, please contact night-coverage [email or web address] 09/19/2022, 8:35 AM

## 2022-09-19 NOTE — ED Notes (Signed)
Patient placed on 2L [place].

## 2022-09-19 NOTE — ED Notes (Addendum)
Patient alert to voice at this time, not always responding with verbal response, still lethargic. MD Blaine Hamper and MD Bhagat notified at this time.

## 2022-09-19 NOTE — ED Notes (Signed)
Patient requesting stronger pain medication for knee as she was taking Percocet at home for post op knee pain, MD Randie Heinz regarding this

## 2022-09-19 NOTE — Consult Note (Signed)
Neurology Consultation Reason for Consult: Breakthrough Seizure Requesting Physician: Ivor Costa  CC: Breakthrough seizure   History is obtained from: Patient, husband, chart review   HPI: Brittany Nguyen is a 57 y.o. female with a past medical history significant for seizures on Vimpat, prolonged QTc, hypertension, diabetes, COPD, asthma, obstructive sleep apnea on CPAP, coronary artery disease, MI, heart failure with preserved EF (45%), AICD in place (MRI compatible per patient's report), hypothyroidism, depression/anxiety, DVT not on anticoagulation with IVC placed on 08/23/2022, carcinoid tumor of the stomach, chronic pain.  She was initially on Keppra but this was changed to Vimpat in March 2023 due to concern for prolonged QTc.  She is also on gabapentin 100 mg nightly for RLS symptoms which she is tolerating well.  She did have a breakthrough seizure last in June 2023 at which time increase of Vimpat to 150 twice daily was recommended but in consultation with her outpatient neurologist she continued 100 mg twice daily due to infrequent breakthrough seizures.  She notes that she has had poor sleep recently due to family stressors as well as knee surgery about 3 weeks ago with increase in her chronic pain.  She notes she did change her CPAP 2 months ago to a mask that fits better and falls off less frequently and this has overall been helpful but she continues to have poor sleep.  Triggers for seizures in the past have included poor sleep and electrolyte derangements in the setting of her stable difficulty eating and due to her carcinoid tumor of the stomach.  On review of records she did have a presentation in November 2023 with ~20 minutes of left facial twitching felt to be possible focal seizure.  She notes she has been having that twitching before, but cannot remember the exact last time she had it since November though she has had several additional episodes (both prior to and since  November 2023).  She notes her physicians are unsure if this is seizure activity or movement disorder.  She denies increasing frequency of these events or increasing length of the events.  She denies any spread of the twitching to any other parts of her body.  She notes that she had always attributed the facial twitching to just being a spasm of her face and not seizure activity  ROS: All other review of systems was negative except as noted in the HPI.   Past Medical History:  Diagnosis Date   Acute anterolateral wall MI ([place]) 08/2016   AICD (automatic cardioverter/defibrillator) present 03/29/2017   a.) Medtronic Evera MRI XT DR SureScan (SN: [id number])   Anxiety    Arthritis    Asthma 2013   C. 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[place])    a.) on lacosamide; b.) last seizure 07-30-22 in setting of hypokalemia (K+ 2.6)   GERD (gastroesophageal reflux disease)    GIB (gastrointestinal bleeding) 01/08/2018   HFrEF (heart failure with reduced ejection fraction) ([place])  a.)TTE 12/09/16: EF 25-30%, dif HK; b.) TTE 02/08/17: EF 25%, dif HK, mild TR, G1DD; c.) TTE 04/25/17: EF 30-35%, mild LVH; d.) TTE 01/30/18: EF 45%, MAC, LAE, mild MR, G1DD; e.) TTE 11/06/2018: EF 25%, sev glob HK, triv MR/TR/PR; f.) TTE 05/12/19: EF 35%, MAC, AoV sclerosis, G1DD; g.) TTE 11/05/19: EF 20%, sev glob HK, triv MR; h.) TTE 06/17/20: EF 40%, mod glob HK, triv MR; I:) TTE 06/14/22: EF 25-30%, G1DD   History of  cardiac catheterization    a.) LHC 10/21/2014: normal coronaries; b.) R/LHC 02/24/2017: normal coronaries, mRA 12, mPA 28, mPCWP 15, CO 8.0, CI 3.8   History of kidney stones    Hx MRSA infection    Hypokalemia    Hypothyroidism    Iron deficiency anemia 03/27/2018   Lump or mass in breast 11/27/2012   a.) Bx 11/27/2012 --> apocrine wall cyst   Migraine    Nausea & vomiting    Neuropathy    Non-ischemic cardiomyopathy ([place])    a.) TTE 12/09/2016: EF 25-30%, b.) TTE 02/08/2017: EF 25%; c.) Medtronic AICD placed 03/29/2017; d.) TTE 04/25/2017: EF 30-35%; e.) TTE 01/30/2018: EF 45%; f.) TTE 11/06/2018: EF 25%; g.) TTE 05/12/2019: EF 35%; h.) TTE 11/05/2019: EF 20%; I.) TTE 06/17/2020: EF 40%; J.) TTE 06/14/2022: EF 25-30%   Obesity    OSA on CPAP    PAF (paroxysmal atrial fibrillation) ([facility])    PONV (postoperative nausea and vomiting)    Post-COVID chronic cough    Postcholecystectomy diarrhea    Prolonged Q-T interval on ECG    Restless leg    a.) on ropinirole   Unstable angina ([facility])    Current Outpatient Medications  Medication Instructions   acetaminophen (TYLENOL) 1,000 mg, Oral, Every 6 hours PRN   albuterol (ACCUNEB) 1.25 MG/3ML nebulizer solution 1 ampule, Every 6 hours PRN   albuterol (VENTOLIN HFA) 108 (90 Base) MCG/ACT inhaler 1-2 puffs, Every 6 hours PRN   benzonatate (TESSALON PERLES) 100 mg, Oral, 3 times daily PRN   cyclobenzaprine (FLEXERIL) 5 mg, Oral, 4 times daily PRN   enoxaparin (LOVENOX) 40 mg, Subcutaneous, Every 24 hours   escitalopram (LEXAPRO) 10 mg, BH-each morning   fentaNYL (DURAGESIC) 75 MCG/HR 1 patch, Transdermal, Every 3 DAYS   furosemide (LASIX) 40 mg, Oral, 2 times daily   gabapentin (NEURONTIN) 100 mg, Oral, Daily at bedtime   Lacosamide 100 MG TABS 1 tablet, Oral, 2 times daily   lanreotide acetate (SOMATULINE DEPOT) 120 mg, Every 28 days   levothyroxine (SYNTHROID) 175 mcg, Oral, Every morning   LORazepam (ATIVAN) 1 mg, Oral, 4 times daily  PRN   losartan (COZAAR) 12.5 mg, 2 times daily   metoprolol succinate (TOPROL-XL) 50 mg, Oral, 2 times daily   morphine (MSIR) 15 mg, Oral, Every 4 hours PRN   naloxone (NARCAN) nasal spray 4 mg/0.1 mL 1 spray, Nasal, As needed, May repeat once in opposite nare.   nitroGLYCERIN (NITROSTAT) 0.4 mg, Sublingual, Every 5 min x3 PRN   oxyCODONE-acetaminophen (PERCOCET/ROXICET) 5-325 MG tablet 1-2 tablets, Oral, Every 4 hours PRN   Potassium Chloride ER 20 MEQ TBCR 1 tablet, Oral, 2 times daily   predniSONE (DELTASONE) 10 MG tablet Take 5 tablets (50 mg total) by mouth daily with breakfast for 2 days, THEN 4 tablets (40 mg total) daily with breakfast for 2 days, THEN 3 tablets (30 mg total) daily with breakfast for 2 days, THEN 2 tablets (20 mg total) daily with breakfast for 2 days, THEN  1 tablet (10 mg total) daily with breakfast for 2 days.   promethazine (PHENERGAN) 25 mg, Oral, Every 4 hours PRN   rOPINIRole (REQUIP) 4 mg, Oral, Daily at bedtime    Family History  Problem Relation Age of Onset   Cancer Mother 66       ovarian   CAD Mother    Cancer Father 13       brain   CAD Father    Cancer Daughter 6       skin   Cancer Maternal Aunt 63       breast   Leukemia Paternal Grandfather    Past Surgical History:  Procedure Laterality Date   ABDOMINAL HYSTERECTOMY     age 51   BREAST BIOPSY Right 2014   benign   CARDIAC DEFIBRILLATOR PLACEMENT Left 03/29/2017   Procedure: MEDTRONIC CARDIAC DEFIBRILLATOR PLACEMENT (AICD); Location: [facility]; Surgeon: Burnett Sheng, MD   CESAREAN SECTION     CHOLECYSTECTOMY     COLECTOMY     COLONOSCOPY WITH PROPOFOL N/A 02/22/2018   Procedure: COLONOSCOPY WITH PROPOFOL;  Surgeon: Toledo, Benay Pike, MD;  Location: [facility] ENDOSCOPY;  Service: Gastroenterology;  Laterality: N/A;   ESOPHAGOGASTRODUODENOSCOPY (EGD) WITH PROPOFOL N/A 02/22/2018   Procedure: ESOPHAGOGASTRODUODENOSCOPY (EGD) WITH PROPOFOL;  Surgeon: Toledo, Benay Pike, MD;  Location: [facility]  ENDOSCOPY;  Service: Gastroenterology;  Laterality: N/A;   IVC FILTER INSERTION N/A 08/23/2022   Procedure: IVC FILTER INSERTION;  Surgeon: Katha Cabal, MD;  Location: [facility];  Service: Cardiovascular;  Laterality: N/A;   KNEE ARTHROPLASTY Left 08/24/2022   Procedure: COMPUTER ASSISTED TOTAL KNEE ARTHROPLASTY;  Surgeon: Dereck Leep, MD;  Location: [facility] ORS;  Service: Orthopedics;  Laterality: Left;   LEFT HEART CATH AND CORONARY ANGIOGRAPHY Left 10/21/2014   Procedure: LEFT HEART CATH AND CORONARY ANGIOGRAPHY; Location: [facility]; Surgeon: Serafina Royals, MD   NASAL SINUS SURGERY  2012   OOPHORECTOMY     RIGHT/LEFT HEART CATH AND CORONARY ANGIOGRAPHY Bilateral 02/24/2017   Procedure: RIGHT/LEFT HEART CATH AND CORONARY ANGIOGRAPHY; Location: [facility]   THYROIDECTOMY       Social History:  reports that she has never smoked. She has never used smokeless tobacco. She reports that she does not drink alcohol and does not use drugs.   Exam: Current vital signs: BP 91/66   Pulse 70   Temp 98.7 F (37.1 C) (Axillary)   Resp 16   LMP 01/22/2012 (Approximate) Comment: Hysterectomy 5 years ago  SpO2 97%  Vital signs in last 24 hours: Temp:  [98.7 F (37.1 C)] 98.7 F (37.1 C) (01/15 0537) Pulse Rate:  [70-78] 70 (01/15 0645) Resp:  [16-19] 16 (01/15 0645) BP: (91-104)/(66-71) 91/66 (01/15 0645) SpO2:  [93 %-97 %] 97 % (01/15 0645)   Physical Exam  Constitutional: Appears well-developed and well-nourished.  Psych: Affect appropriate to situation, tired but cooperative Eyes: No scleral injection HENT: No oropharyngeal obstruction.  No reported head pain MSK: no significant joint deformities.  Cardiovascular: Normal rate and regular rhythm. Perfusing extremities well Respiratory: Effort normal, non-labored breathing GI: Soft.  No distension. There is no tenderness.  Skin: Warm dry and intact visible skin  Neuro: Mental Status: Patient is awake, alert, oriented to  person, place, month, year, and situation. Patient is able to give a clear and coherent history. No signs of aphasia or neglect Cranial Nerves: II: Visual Fields are full. Pupils are equal, round, and reactive to light.  III,IV, VI: EOMI without ptosis or diploplia.  V:  Facial sensation is symmetric to temperature VII: Facial movement is symmetric.  VIII: hearing is intact to voice X: Uvula elevates symmetrically XI: Shoulder shrug is symmetric. XII: tongue is midline without atrophy or fasciculations.  Motor: 5/5 strength in the bilateral deltoids, 4+/5 right hip flexion, 3/5 left hip flexion secondary to pain.  No pronator drift in the bilateral upper extremities Sensory: Sensation is symmetric to light touch and temperature in the arms and legs, except some left leg numbness she attributes to her 2 recent left total knee replacement Deep Tendon Reflexes: 2+ and symmetric in the brachioradialis  Cerebellar: FNF and HKS are intact bilaterally, within limits of left knee pain Gait:  Deferred    I have reviewed labs in epic and the results pertinent to this consultation are:  Basic Metabolic Panel: Recent Labs  Lab 09/19/22 0548  NA 140  K 3.1*  CL 99  CO2 31  GLUCOSE 131*  BUN 10  CREATININE 1.05*  CALCIUM 8.0*  MG 2.2  PHOS 6.3*    CBC: Recent Labs  Lab 09/19/22 0548  WBC 6.2  NEUTROABS 3.5  HGB 11.2*  HCT 34.8*  MCV 92.8  PLT 219    I have reviewed the images obtained:  Head CT personally reviewed, agree with radiology: 1. Mild left lateral scalp hematoma or contusion. No underlying skull fracture. 2. Stable and normal for age noncontrast CT appearance of the brain. 3. Chronic paranasal sinus disease, mildly improved since November.  EKGs reviewed, PR initially 11/07/2021 203 with QTc of 509, transitioned from Keppra to Vimpat with subsequent PR 185, QTc 405 Most recent EKG here with PR of 205, QTc 530  Impression: Breakthrough seizure in the setting of  poor sleep.  Due to her EKG abnormalities I am hesitant to further increase Vimpat, as this is typically more associated with QTc prolongation and PR prolongation and then Keppra.  However I am also unsure if the antiseizure medications are truly contributing to her EKG abnormalities or if this is more secondary to her electrolyte derangements.  The other probable trigger for her breakthrough seizure is poor sleep.  Will address this by increasing gabapentin for now, only slightly increasing the dose at this time to promote better sleep.  However if she continues to have further activity concerning for breakthrough seizures, consider increasing to a seizure control dose (300 mg 3 times daily)  Recommendations: -Continue Vimpat 100 mg twice daily -Increase gabapentin nightly to 300 mg to promote sleep (ordered) -Management of electrolyte derangements per primary team noting that this may be leading to her EKG abnormalities as well as breakthrough seizures -Outpatient MRI brain pending, seizure protocol and pacer protocol -Close outpatient follow-up with Dr. Manuella Ghazi, patient instructed to video her left facial twitching for review by Dr. Manuella Ghazi -Inpatient neurology will be available as needed going forward, please reach out if any additional questions or concerns arise -Please review seizure precautions with patient and included in discharge instructions as below  Standard seizure precautions: Per [facility] statutes, patients with seizures are not allowed to drive until  they have been seizure-free for six months. Use caution when using heavy equipment or power tools. Avoid working on ladders or at heights. Take showers instead of baths. Ensure the water temperature is not too high on the home water heater. Do not go swimming alone. When caring for infants or small children, sit down when holding, feeding, or changing them to minimize risk of injury to the child in the event  you have a seizure.  To  reduce risk of seizures, maintain good sleep hygiene avoid alcohol and illicit drug use, take all anti-seizure medications as prescribed.    Lesleigh Noe MD-PhD [facility] [phone] [facility] coverage for [facility] is from 8 AM to 4 AM in-house and 4 PM to 8 PM by telephone/video. 8 PM to 8 AM emergent questions or overnight urgent questions should be addressed to Teleneurology On-call or [facility] neuro[facility]ist; contact information can be found on AMION

## 2022-09-19 NOTE — ED Notes (Signed)
$'75mg'b$  fentanyl patch removed from patient which had been applied 1/13 at 1800.

## 2022-09-19 NOTE — ED Notes (Signed)
Two unsuccessful PIV attempts by this RN at this time.

## 2022-09-19 NOTE — Progress Notes (Signed)
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, however her poor responsiveness may also be due to recently administered Ativan  Ativan only for seizure activity ongoing greater than 5 minutes with patient unresponsive to saline stimulation to the eyes or with significant sustained vital sign instabilities  EKG with PR of 213 and QTc 537  Stat BMP and magnesium, replete as needed Will hold off on additional antiseizure medication at this time and continue to monitor with plan as before for increasing gabapentin If patient is having continued frequent spells may need transfer to [facility] for spell capture but given bed availability I think transfer is likely to be significantly delayed Neurology will continue to follow

## 2022-09-19 NOTE — ED Provider Notes (Signed)
[facility] Provider Note    Event Date/Time   First MD Initiated Contact with Patient 09/19/22 [phone]     (approximate)   History   Seizures   HPI  Brittany Nguyen is a 57 y.o. female with history of COPD, chronic pain on chronic opioids, paroxysmal A-fib, CHF status post pacemaker/AICD, hypothyroidism, history of prolonged QT interval, seizures on Vimpat who presents to the emergency department with EMS for concerns for seizure activity.  Family called EMS for seizure-like activity and reportedly patient was having seizures at home that were generalized tonic-clonic for approximately 20 minutes per EMS.  They state that they witnessed another 10 to 15 minutes worth of seizures each lasting for a few minutes at a time then she would have a brief break in seizure activity before she would start seizing again.  They state they gave her 5 mg of IM Versed and she had not had any generalized tonic-clonic movements for about 6 to 7 minutes.  On arrival in the ED she has some abnormal movements of the head but no other seizure-like activity.  She is arousable to voice and able to answer questions.  She is complaining of headache which she states is normal after a seizure.   History provided by patient, EMS.    Past Medical History:  Diagnosis Date   Acute anterolateral wall MI ([place]) 08/2016   AICD (automatic cardioverter/defibrillator) present 03/29/2017   a.) Medtronic Evera MRI XT DR SureScan (SN: [id number])   Anxiety    Arthritis    Asthma 2013   C. 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)    Depression     Diet-controlled type 2 diabetes mellitus ([place])    Diverticulitis 2010   DVT (deep venous thrombosis) ([place])    Endometriosis 1990   Generalized epilepsy ([place])    a.) on lacosamide; b.) last seizure 07-30-22 in setting of hypokalemia (K+ 2.6)   GERD (gastroesophageal reflux disease)    GIB (gastrointestinal bleeding) 01/08/2018   HFrEF (heart failure with reduced ejection fraction) ([place])    a.)TTE 12/09/16: EF 25-30%, dif HK; b.) TTE 02/08/17: EF 25%, dif HK, mild TR, G1DD; c.) TTE 04/25/17: EF 30-35%, mild LVH; d.) TTE 01/30/18: EF 45%, MAC, LAE, mild MR, G1DD; e.) TTE 11/06/2018: EF 25%, sev glob HK, triv MR/TR/PR; f.) TTE 05/12/19: EF 35%, MAC, AoV sclerosis, G1DD; g.) TTE 11/05/19: EF 20%, sev glob HK, triv MR; h.) TTE 06/17/20: EF 40%, mod glob HK, triv MR; I:) TTE 06/14/22: EF 25-30%, G1DD   History of cardiac catheterization    a.) LHC 10/21/2014: normal coronaries; b.) R/LHC 02/24/2017: normal coronaries, mRA 12, mPA 28, mPCWP 15, CO 8.0, CI 3.8   History of kidney stones    Hx MRSA infection    Hypokalemia    Hypothyroidism    Iron deficiency anemia 03/27/2018   Lump or mass in breast 11/27/2012   a.) Bx 11/27/2012 --> apocrine wall cyst   Migraine    Nausea & vomiting    Neuropathy    Non-ischemic cardiomyopathy ([place])    a.) TTE 12/09/2016: EF 25-30%, b.) TTE 02/08/2017: EF 25%; c.) Medtronic AICD  placed 03/29/2017; d.) TTE 04/25/2017: EF 30-35%; e.) TTE 01/30/2018: EF 45%; f.) TTE 11/06/2018: EF 25%; g.) TTE 05/12/2019: EF 35%; h.) TTE 11/05/2019: EF 20%; I.) TTE 06/17/2020: EF 40%; J.) TTE 06/14/2022: EF 25-30%   Obesity    OSA on CPAP    PAF (paroxysmal atrial fibrillation) (HCC)    PONV (postoperative nausea and vomiting)    Post-COVID chronic cough    Postcholecystectomy diarrhea    Prolonged Q-T interval on ECG    Restless leg    a.) on ropinirole   Unstable angina ([place])     Past Surgical History:  Procedure Laterality Date   ABDOMINAL HYSTERECTOMY     age 73   BREAST BIOPSY  Right 2014   benign   CARDIAC DEFIBRILLATOR PLACEMENT Left 03/29/2017   Procedure: [place] (AICD); Location: [facility]; Surgeon: Burnett Sheng, MD   CESAREAN SECTION     CHOLECYSTECTOMY     COLECTOMY     COLONOSCOPY WITH PROPOFOL N/A 02/22/2018   Procedure: COLONOSCOPY WITH PROPOFOL;  Surgeon: Toledo, Benay Pike, MD;  Location: [facility];  Service: Gastroenterology;  Laterality: N/A;   ESOPHAGOGASTRODUODENOSCOPY (EGD) WITH PROPOFOL N/A 02/22/2018   Procedure: ESOPHAGOGASTRODUODENOSCOPY (EGD) WITH PROPOFOL;  Surgeon: Toledo, Benay Pike, MD;  Location: [facility];  Service: Gastroenterology;  Laterality: N/A;   IVC FILTER INSERTION N/A 08/23/2022   Procedure: IVC FILTER INSERTION;  Surgeon: Katha Cabal, MD;  Location: [facility];  Service: Cardiovascular;  Laterality: N/A;   KNEE ARTHROPLASTY Left 08/24/2022   Procedure: COMPUTER ASSISTED TOTAL KNEE ARTHROPLASTY;  Surgeon: Dereck Leep, MD;  Location: [facility];  Service: Orthopedics;  Laterality: Left;   LEFT HEART CATH AND CORONARY ANGIOGRAPHY Left 10/21/2014   Procedure: LEFT HEART CATH AND CORONARY ANGIOGRAPHY; Location: [facility]; Surgeon: Serafina Royals, MD   NASAL SINUS SURGERY  2012   OOPHORECTOMY     RIGHT/LEFT HEART CATH AND CORONARY ANGIOGRAPHY Bilateral 02/24/2017   Procedure: RIGHT/LEFT HEART CATH AND CORONARY ANGIOGRAPHY; Location: [facility]   THYROIDECTOMY      MEDICATIONS:  Prior to Admission medications   Medication Sig Start Date End Date Taking? Authorizing Provider  acetaminophen (TYLENOL) 500 MG tablet Take 2 tablets (1,000 mg total) by mouth every 6 (six) hours as needed. 09/11/22 09/11/23  Teodoro Spray, PA  albuterol (ACCUNEB) 1.25 MG/3ML nebulizer solution Take 1 ampule by nebulization every 6 (six) hours as needed for wheezing. Patient not taking: Reported on 08/23/2022    [provider]  albuterol (VENTOLIN HFA) 108 (90 Base) MCG/ACT inhaler Inhale 1-2  puffs into the lungs every 6 (six) hours as needed for wheezing or shortness of breath. Patient not taking: Reported on 08/23/2022    [provider]  benzonatate (TESSALON PERLES) 100 MG capsule Take 1 capsule (100 mg total) by mouth 3 (three) times daily as needed for cough. 09/11/22 09/11/23  Teodoro Spray, PA  enoxaparin (LOVENOX) 40 MG/0.4ML injection Inject 0.4 mLs (40 mg total) into the skin daily for 14 days. 08/25/22 09/08/22  Tamala Julian B, PA-C  escitalopram (LEXAPRO) 10 MG tablet Take 10 mg by mouth every morning. 12/30/21   [provider]  fentaNYL (DURAGESIC) 75 MCG/HR Place 1 patch onto the skin every 3 (three) days. 01/12/22   [provider]  furosemide (LASIX) 40 MG tablet Take 40 mg by mouth 2 (two) times daily. 02/17/22   [provider]  gabapentin (NEURONTIN) 100 MG capsule Take 100 mg by mouth at bedtime. 06/01/21  [provider]  Lacosamide 100 MG TABS Take 1 tablet by mouth 2 (two) times daily.    [provider]  lanreotide acetate (SOMATULINE DEPOT) 120 MG/0.5ML injection Inject 120 mg into the skin every 28 (twenty-eight) days.    [provider]  levothyroxine (SYNTHROID) 175 MCG tablet Take 175 mcg by mouth every morning. 12/17/21   [provider]  LORazepam (ATIVAN) 1 MG tablet Take 1 tablet (1 mg total) by mouth 4 (four) times daily as needed. 11/10/21   Lorella Nimrod, MD  losartan (COZAAR) 25 MG tablet Take 12.5 mg by mouth 2 (two) times daily. 01/17/22   [provider]  metoprolol succinate (TOPROL-XL) 50 MG 24 hr tablet Take 50 mg by mouth 2 (two) times daily. 02/16/22   [provider]  morphine (MSIR) 15 MG tablet Take 15 mg by mouth every 4 (four) hours as needed. 02/17/22   [provider]  naloxone [facility]) nasal spray 4 mg/0.1 mL Place 1 spray into the nose as needed for opioid reversal. May repeat once in opposite nare. 10/05/21   [provider]   nitroGLYCERIN (NITROSTAT) 0.4 MG SL tablet Place 0.4 mg under the tongue every 5 (five) minutes x 3 doses as needed for chest pain. 11/23/21   [provider]  oxyCODONE-acetaminophen (PERCOCET/ROXICET) 5-325 MG tablet Take 1-2 tablets by mouth every 4 (four) hours as needed (breakthrough pain). 01/17/20   [provider]  Potassium Chloride ER 20 MEQ TBCR Take 1 tablet by mouth in the morning and at bedtime. 02/16/22   [provider]  predniSONE (DELTASONE) 10 MG tablet Take 5 tablets (50 mg total) by mouth daily with breakfast for 2 days, THEN 4 tablets (40 mg total) daily with breakfast for 2 days, THEN 3 tablets (30 mg total) daily with breakfast for 2 days, THEN 2 tablets (20 mg total) daily with breakfast for 2 days, THEN 1 tablet (10 mg total) daily with breakfast for 2 days. 09/11/22 09/21/22  Teodoro Spray, PA  promethazine (PHENERGAN) 25 MG tablet Take 25 mg by mouth every 4 (four) hours as needed for nausea/vomiting. 12/13/21   [provider]  rOPINIRole (REQUIP) 4 MG tablet Take 4 mg by mouth at bedtime. 01/30/19   [provider]    Physical Exam   Triage Vital Signs: ED Triage Vitals  Enc Vitals Group     BP 09/19/22 0537 104/71     Pulse Rate 09/19/22 0537 78     Resp 09/19/22 0537 19     Temp 09/19/22 0537 98.7 F (37.1 C)     Temp Source 09/19/22 0537 Axillary     SpO2 09/19/22 0535 95 %     Weight --      Height --      Head Circumference --      Peak Flow --      Pain Score 09/19/22 0539 0     Pain Loc --      Pain Edu? --      Excl. in [place]? --      Most recent vital signs: Vitals:   09/19/22 0537 09/19/22 0645  BP: 104/71 91/66  Pulse: 78 70  Resp: 19 16  Temp: 98.7 F (37.1 C)   SpO2: 93% 97%    CONSTITUTIONAL: Alert and oriented and responds appropriately to questions.  Somnolent but arousable to voice. HEAD: Normocephalic, atraumatic EYES: Conjunctivae clear, pupils appear equal, sclera nonicteric,  extraocular movements intact ENT: normal nose;  moist mucous membranes, no tongue biting, no dental injury NECK: Supple, normal ROM midline step-off or deformity, no meningismus CARD: RRR; S1 and S2 appreciated; no murmurs, no clicks, no rubs, no gallops RESP: Normal chest excursion without splinting or tachypnea; breath sounds clear and equal bilaterally; no wheezes, no rhonchi, no rales, no hypoxia or respiratory distress, speaking full sentences ABD/GI: Normal bowel sounds; non-distended; soft, non-tender, no rebound, no guarding, no peritoneal signs BACK: The back appears normal EXT: Normal ROM in all joints; no deformity noted, no edema; no cyanosis, 75 mcg fentanyl patch noted on the right upper extremity that was removed in the ED SKIN: Normal color for age and race; warm; no rash on exposed skin NEURO: Moves all extremities equally, normal speech, no facial asymmetry, reports normal sensation  PSYCH: The patient's mood and manner are appropriate.   ED Results / Procedures / Treatments   LABS: (all labs ordered are listed, but only abnormal results are displayed) Labs Reviewed  COMPREHENSIVE METABOLIC PANEL - Abnormal; Notable for the following components:      Result Value   Potassium 3.1 (*)    Glucose, Bld 131 (*)    Creatinine, Ser 1.05 (*)    Calcium 8.0 (*)    All other components within normal limits  CBC WITH DIFFERENTIAL/PLATELET - Abnormal; Notable for the following components:   RBC 3.75 (*)    Hemoglobin 11.2 (*)    HCT 34.8 (*)    All other components within normal limits  CBG MONITORING, ED - Abnormal; Notable for the following components:   Glucose-Capillary 116 (*)    All other components within normal limits  MAGNESIUM  URINALYSIS, ROUTINE W REFLEX MICROSCOPIC  ETHANOL  URINE DRUG SCREEN, QUALITATIVE ([facility] ONLY)     EKG:    Date: 09/19/2022 5:38 AM  Rate: 77  Rhythm: Left axis deviation  QRS Axis: normal  Intervals: Long QT interval  ST/T Wave  abnormalities: normal  Conduction Disutrbances: none  Narrative Interpretation: Left anterior fascicular block, prolonged QT interval with QTc of 614 ms     RADIOLOGY: My personal review and interpretation of imaging: CT head shows scalp hematoma but no acute underlying fracture or other abnormality.  CT cervical spine unremarkable.  I have personally reviewed all radiology reports.   CT Cervical Spine Wo Contrast  Result Date: 09/19/2022 CLINICAL DATA:  57 year old female status post witness seizure. Postictal. Headache. EXAM: CT CERVICAL SPINE WITHOUT CONTRAST TECHNIQUE: Multidetector CT imaging of the cervical spine was performed without intravenous contrast. Multiplanar CT image reconstructions were also generated. RADIATION DOSE REDUCTION: This exam was performed according to the departmental dose-optimization program which includes automated exposure control, adjustment of the mA and/or kV according to patient size and/or use of iterative reconstruction technique. COMPARISON:  Head CT today.  Neck CT 12/06/2020. FINDINGS: Alignment: Chronic straightening of cervical lordosis is stable since 2022. Cervicothoracic junction alignment is within normal limits. Bilateral posterior element alignment is within normal limits. Skull base and vertebrae: Visualized skull base is intact. No atlanto-occipital dissociation. C1 and C2 appear stable and intact, aligned. No acute osseous abnormality identified. Soft tissues and spinal canal: No prevertebral fluid or swelling. No visible canal hematoma. Partially retropharyngeal course of both carotids more so the left. Mild larynx motion artifact. Surgically absent thyroid. Otherwise negative visible noncontrast neck soft tissues. Chronic carious posterior mandible dentition. Disc levels: Chronic disc bulging. But mild for age degeneration overall. No cervical spinal stenosis suspected. Upper chest: Partially visible left chest pacemaker. Stable  lung apices with  mild scarring. Visible upper thoracic levels appear intact. IMPRESSION: No acute traumatic injury identified in the cervical spine. Electronically Signed   By: Genevie Ann M.D.   On: 09/19/2022 06:27   CT HEAD WO CONTRAST (5MM)  Result Date: 09/19/2022 CLINICAL DATA:  57 year old female status post witness seizure. Postictal. Headache. EXAM: CT HEAD WITHOUT CONTRAST TECHNIQUE: Contiguous axial images were obtained from the base of the skull through the vertex without intravenous contrast. RADIATION DOSE REDUCTION: This exam was performed according to the departmental dose-optimization program which includes automated exposure control, adjustment of the mA and/or kV according to patient size and/or use of iterative reconstruction technique. COMPARISON:  Brain MRI 06/22/2018.  Head CT 07/30/2022. FINDINGS: Brain: Cerebral volume is stable and within normal limits. No midline shift, ventriculomegaly, mass effect, evidence of mass lesion, intracranial hemorrhage or evidence of cortically based acute infarction. Gray-white matter differentiation is within normal limits throughout the brain. No encephalomalacia identified. Vascular: No suspicious intracranial vascular hyperdensity. Skull: No acute osseous abnormality identified. Hyperostosis of the calvarium, normal variant. Sinuses/Orbits: Left sphenoid and ethmoid sinus aeration has improved since November. Residual bubbly opacity in the right frontal sinus. Incidental left frontoethmoidal sinus osteoma (chronic). Tympanic cavities and mastoids appear clear. Other: Mild left lateral scalp hematoma or contusion series 3, image 58. No scalp soft tissue gas. No orbits soft tissue injury identified. IMPRESSION: 1. Mild left lateral scalp hematoma or contusion. No underlying skull fracture. 2. Stable and normal for age noncontrast CT appearance of the brain. 3. Chronic paranasal sinus disease, mildly improved since November. Electronically Signed   By: Genevie Ann M.D.   On:  09/19/2022 06:23     PROCEDURES:  Critical Care performed: Yes, see critical care procedure note(s)   CRITICAL CARE Performed by: Cyril Mourning Amr Sturtevant   Total critical care time: 40 minutes  Critical care time was exclusive of separately billable procedures and treating other patients.  Critical care was necessary to treat or prevent imminent or life-threatening deterioration.  Critical care was time spent personally by me on the following activities: development of treatment plan with patient and/or surrogate as well as nursing, discussions with consultants, evaluation of patient's response to treatment, examination of patient, obtaining history from patient or surrogate, ordering and performing treatments and interventions, ordering and review of laboratory studies, ordering and review of radiographic studies, pulse oximetry and re-evaluation of patient's condition.   Marland Kitchen1-3 Lead EKG Interpretation  Performed by: Reannah Totten, Delice Bison, DO Authorized by: Valari Taylor, Delice Bison, DO     Interpretation: normal     ECG rate:  78   ECG rate assessment: normal     Rhythm: sinus rhythm     Ectopy: none     Conduction: normal       IMPRESSION / MDM / ASSESSMENT AND PLAN / ED COURSE  I reviewed the triage vital signs and the nursing notes.    Patient here with seizure-like activity that has lasted more than 30 minutes.  On arrival having some abnormal head movements he received 2 mg of IV Ativan.  No seizure-like activity stopped.  Patient arousable and able to answer questions.  Complains of headache which is normal for her after seizures.  The patient is on the cardiac monitor to evaluate for evidence of arrhythmia and/or significant heart rate changes.   DIFFERENTIAL DIAGNOSIS (includes but not limited to):   Seizures, electrolyte derangement, UTI, medication noncompliance   Patient's presentation is most consistent with acute presentation with potential  threat to life or bodily  function.   PLAN: Patient does not appear to be in status epilepticus at this time.  Seizure activity has ceased.  Given she has history of prolonged QT interval on EKG and QT today is 613 ms, will hold off on Keppra and give IV Vimpat.  She is awake and protecting her airway.  Will obtain CT head and cervical spine given complaints of headache with prolonged seizure-like activity.  Will give Tylenol for pain control.  Will obtain CBC, CMP, magnesium level, urinalysis, urine drug screen, ethanol level.  Anticipate admission for prolonged seizure-like activity.   MEDICATIONS GIVEN IN ED: Medications  0.9 %  sodium chloride infusion ( Intravenous New Bag/Given 09/19/22 0552)  lacosamide (VIMPAT) 200 mg in sodium chloride 0.9 % 25 mL IVPB (has no administration in time range)  potassium chloride 10 mEq in 100 mL IVPB (has no administration in time range)  LORazepam (ATIVAN) 2 MG/ML concentrated solution 2 mg (2 mg Oral Given 09/19/22 0534)  acetaminophen (OFIRMEV) IV 1,000 mg (1,000 mg Intravenous New Bag/Given 09/19/22 1610)     ED COURSE: Labs show no leukocytosis.  Mild anemia which is chronic and stable.  Potassium of 3.1.  Will give IV replacement.  Normal creatinine, glucose.  Normal magnesium level.  CT head and cervical spine reviewed and interpreted by myself and the radiologist and shows left lateral scalp hematoma but no other traumatic injury.  Urine pending.  Will discuss with hospitalist for admission for further monitoring for prolonged seizure-like activity.   CONSULTS:  Consulted and discussed patient's case with hospitalist, Dr. Blaine Hamper.  I have recommended admission and consulting physician agrees and will place admission orders.  Patient (and family if present) agree with this plan.   I reviewed all nursing notes, vitals, pertinent previous records.  All labs, EKGs, imaging ordered have been independently reviewed and interpreted by myself.    OUTSIDE RECORDS REVIEWED: Reviewed  last admission in November 2023 for seizures, hypokalemia, hypocalcemia, prolonged QT interval.       FINAL CLINICAL IMPRESSION(S) / ED DIAGNOSES   Final diagnoses:  Seizure ([place])  Prolonged Q-T interval on ECG     Rx / DC Orders   ED Discharge Orders     None        Note:  This document was prepared using Dragon voice recognition software and may include unintentional dictation errors.   Narda Fundora, Delice Bison, DO 09/19/22 [phone]

## 2022-09-19 NOTE — ED Notes (Signed)
Patient transported to CT 

## 2022-09-19 NOTE — ED Notes (Signed)
MD at bedside to evaluate at this time, after evaluation states patient does not need further medication at this time. Will continue to monitor and MD states if patient does not become more responsive within 10 minutes to notify.

## 2022-09-19 NOTE — ED Notes (Addendum)
This RN in room with patient eating lunch when patient states she "feels weird," and states she feels like she is "in a tunnel." Patient speaking with this RN when she began having seizure-like activity and became unresponsive at approximately 1309. ED MD Delsa Sale MD notified, and this RN gave PRN '2mg'$  ativan. Seizure-like activity subsided after PRN ativan, but shortly after began again. MD Niu aware and states he will come to bedside.

## 2022-09-19 NOTE — ED Triage Notes (Addendum)
Patient arrives by EMS after a witnessed seizure lasting 20 minutes intermittently.  EMS reports a seizure lasting 6-7 minutes, and '5mg'$  versed was administered IM.  Seizures stopped after that.  Patient arrives with some shaking of her head.  Patient has a history of seizures for one year.  Patient is profoundly postictal.

## 2022-09-20 DIAGNOSIS — G40909 Epilepsy, unspecified, not intractable, without status epilepticus: Secondary | ICD-10-CM | POA: Diagnosis not present

## 2022-09-20 DIAGNOSIS — R569 Unspecified convulsions: Secondary | ICD-10-CM | POA: Diagnosis not present

## 2022-09-20 LAB — CBC
HCT: 34.2 % — ABNORMAL LOW (ref 36.0–46.0)
Hemoglobin: 11.1 g/dL — ABNORMAL LOW (ref 12.0–15.0)
MCH: 30.1 pg (ref 26.0–34.0)
MCHC: 32.5 g/dL (ref 30.0–36.0)
MCV: 92.7 fL (ref 80.0–100.0)
Platelets: 179 10*3/uL (ref 150–400)
RBC: 3.69 MIL/uL — ABNORMAL LOW (ref 3.87–5.11)
RDW: 14 % (ref 11.5–15.5)
WBC: 5.7 10*3/uL (ref 4.0–10.5)
nRBC: 0 % (ref 0.0–0.2)

## 2022-09-20 LAB — BASIC METABOLIC PANEL
Anion gap: 8 (ref 5–15)
BUN: 13 mg/dL (ref 6–20)
CO2: 27 mmol/L (ref 22–32)
Calcium: 7.2 mg/dL — ABNORMAL LOW (ref 8.9–10.3)
Chloride: 103 mmol/L (ref 98–111)
Creatinine, Ser: 0.88 mg/dL (ref 0.44–1.00)
GFR, Estimated: >60 mL/min (ref >60)
Glucose, Bld: 110 mg/dL — ABNORMAL HIGH (ref 70–99)
Potassium: 3.6 mmol/L (ref 3.5–5.1)
Sodium: 138 mmol/L (ref 135–145)

## 2022-09-20 LAB — CBG MONITORING, ED: Glucose-Capillary: 121 mg/dL — ABNORMAL HIGH (ref 70–99)

## 2022-09-20 LAB — VITAMIN D 25 HYDROXY (VIT D DEFICIENCY, FRACTURES): Vit D, 25-Hydroxy: 10.33 ng/mL — ABNORMAL LOW (ref 30–100)

## 2022-09-20 MED ORDER — MELATONIN 3 MG PO CAPS: 3.0000 mg | ORAL_CAPSULE | Freq: Every day | ORAL | 0 refills | Status: DC

## 2022-09-20 MED ORDER — CALCIUM GLUCONATE-NACL 1-0.675 GM/50ML-% IV SOLN
1.0000 g | Freq: Once | INTRAVENOUS | Status: AC
  Administered 2022-09-20: 1000 mg via INTRAVENOUS
  Filled 2022-09-20: qty 50

## 2022-09-20 MED ORDER — GABAPENTIN 300 MG PO CAPS: 300.0000 mg | ORAL_CAPSULE | Freq: Every day | ORAL | 0 refills | Status: DC

## 2022-09-20 MED ORDER — ENOXAPARIN SODIUM 40 MG/0.4ML IJ SOSY: 40.0000 mg | PREFILLED_SYRINGE | INTRAMUSCULAR | Status: DC

## 2022-09-20 MED ORDER — MAGNESIUM OXIDE 400 MG PO TABS: 400.0000 mg | ORAL_TABLET | Freq: Every day | ORAL | 0 refills | Status: DC

## 2022-09-20 MED ORDER — OYSTER SHELL CALCIUM/D3 500-5 MG-MCG PO TABS: 1.0000 | ORAL_TABLET | Freq: Every day | ORAL | 0 refills | Status: DC

## 2022-09-20 MED ORDER — POTASSIUM CHLORIDE ER 20 MEQ PO TBCR: 1.0000 | EXTENDED_RELEASE_TABLET | Freq: Two times a day (BID) | ORAL | 0 refills | Status: DC

## 2022-09-20 MED ORDER — LORAZEPAM 2 MG/ML IJ SOLN: 2.0000 mg | Freq: Once | INTRAMUSCULAR | Status: DC | PRN

## 2022-09-20 NOTE — Progress Notes (Signed)
Neurology Progress Note  Patient ID: Brittany Nguyen is a 57 y.o. with PMHx of  has a past medical history of Acute anterolateral wall MI ([place]) (08/2016), AICD (automatic cardioverter/defibrillator) present (03/29/2017), Anxiety, Arthritis, Asthma (2013), C. 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), Chronic, continuous use of opioids, COPD (chronic obstructive pulmonary disease) ([place]), Depression, Diet-controlled type 2 diabetes mellitus ([place]), Diverticulitis (2010), DVT (deep venous thrombosis) ([place]), Endometriosis (1990), Generalized epilepsy ([place]), GERD (gastroesophageal reflux disease), GIB (gastrointestinal bleeding) (01/08/2018), HFrEF (heart failure with reduced ejection fraction) ([place]), History of cardiac catheterization, History of kidney stones, MRSA infection, Hypokalemia, Hypothyroidism, Iron deficiency anemia (03/27/2018), Lump or mass in breast (11/27/2012), Migraine, Nausea & vomiting, Neuropathy, Non-ischemic cardiomyopathy ([place]), Obesity, OSA on CPAP, PAF (paroxysmal atrial fibrillation) ([place]), PONV (postoperative nausea and vomiting), Post-COVID chronic cough, Postcholecystectomy diarrhea, Prolonged Q-T interval on ECG, Restless leg, and Unstable angina ([place]).   Subjective: Concern for seizure-like event today: Per patient she was having a left sided headache, starting from a knot that she has had for the past 6 months. Per husband has been complaining of headache from this area for 1-2 years. Subsequently shaking head side to side and poor communication  Additional history from husband, 16 min phone call: Typical spells Stiffening up, head shaking, poor communication 3-4 week period event free, then she'll have a run of them Some variation in how poorly responsive she will be  Yesterday felt dizzy/lightheaded laying in the bed.   Has had a lot of health anxiety lately   On further extensive review of her head CTs, with  review with radiology and on my own personal reveiw, she did not have a hematoma on her May 02, 2019 head CT but she did have a more impressive hematoma in the left parietal area November 20, 2019.  On review of ED records from that day she had presented from [place] after witnessed concern for seizure activity and had struck her head at the time.  On more recent scans this hematoma appears to been largely resolved and likely represents scarring.    Exam: Vitals:   09/20/22 0700 09/20/22 0730  BP: 110/87 109/75  Pulse: 89 94  Resp: 19 15  Temp:    SpO2: 97% 94%   Gen: In bed, comfortable  Resp: non-labored breathing, no grossly audible wheezing Cardiac: Perfusing extremities well  Abd: soft, nt  Neuro: MS: Sleepy post Ativan but awakens to voice, slightly disoriented reporting it is February 2023 but then self corrects to January 2024, reports it is Monday.  Following simple commands.  Fluent speech CN: Pupils equal round reactive to light, face symmetric, tongue midline Motor: Mild pronation of the right upper extremity without drift, stable from yesterday Sensory: No extinction to double simultaneous stimuli, equal sensation in all 4 extremities to light touch  Pertinent Labs:  Basic Metabolic Panel: Recent Labs  Lab 09/19/22 0548 09/19/22 1527 09/20/22 0457  NA 140 139 138  K 3.1* 3.6 3.6  CL 99 100 103  CO2 '31 28 27  '$ GLUCOSE 131* 164* 110*  BUN '10 11 13  '$ CREATININE 1.05* 1.09* 0.88  CALCIUM 8.0* 7.5* 7.2*  MG 2.2 2.1  --   PHOS 6.3*  --   --     CBC: Recent Labs  Lab 09/19/22 0548 09/20/22 0457  WBC 6.2 5.7  NEUTROABS 3.5  --   HGB 11.2* 11.1*  HCT 34.8* 34.2*  MCV 92.8 92.7  PLT 219 179  Coagulation Studies: No results for input(s): "LABPROT", "INR" in the last 72 hours.    Impression: Suspect she may have some local nerve injury from her hematoma sustained on November 20, 2019 that has been contributing to headaches and at least some of her  spells are related to these headaches rather than seizures.  Unclear if she has underlying epilepsy as well, but at this time I do not have concern for ongoing seizure activity  Recommendations: - Appreciate primary team addressing hypocalcemia which may be contributing to prolonged Qtc and close attention to electrolytes and other comorbidities. - Continue Vimpat 100 mg twice daily - Continue gabapentin nightly to 300 mg to promote sleep (ordered)  - Ativan ordered changed, to be given only for seizure activity ongoing greater than 5 minutes with vital sign instability changes and if patient unresponsive to saline flush stimulation to the eyes.  1 dose ordered, neurology to be notified if this dose is used - Outpatient MRI brain pending, seizure protocol and pacer protocol, do recommend change to with and without contrast given her malignancy history - Close outpatient follow-up with Dr. Manuella Ghazi, patient instructed to video her left facial twitching for review by Dr. Manuella Ghazi, as well as record a careful headache history - Consider EMU monitoring at [facility] if prolonged wait for EMU at [place] neurology will be available as needed going forward, please reach out if any additional questions or concerns arise  - Please review seizure precautions with patient and included in discharge instructions as below  Standard seizure precautions: Per [facility] statutes, patients with seizures are not allowed to drive until  they have been seizure-free for six months. Use caution when using heavy equipment or power tools. Avoid working on ladders or at heights. Take showers instead of baths. Ensure the water temperature is not too high on the home water heater. Do not go swimming alone. When caring for infants or small children, sit down when holding, feeding, or changing them to minimize risk of injury to the child in the event you have a seizure.  To reduce risk of seizures, maintain good sleep  hygiene avoid alcohol and illicit drug use, take all anti-seizure medications as prescribed.   Lesleigh Noe MD-PhD [facility] [phone]   Greater than 50 minutes were spent in direct care of this patient today the majority of bedside.  Recommendations reviewed with Dr. Sheppard Coil via phone

## 2022-09-20 NOTE — ED Notes (Signed)
This RN answered the call light in the room and the pt asked for more pain meds because her head was still hurting. This RN went to get the meds and returned to find the pt with her hand over her eyes and her head moving side to side in a rhythmic motion. This RN called out pt's name and she did not respond to voice. ED MD notified and came to bedside, advised to give the PRN ativan. Once medication was administered pt opened her eyes and was able to focus on this RN and respond to her name.

## 2022-09-20 NOTE — Progress Notes (Signed)
PHARMACIST - PHYSICIAN COMMUNICATION  CONCERNING:  Enoxaparin (Lovenox) for DVT Prophylaxis    RECOMMENDATION: Patient was prescribed enoxaprin '40mg'$  q24 hours for VTE prophylaxis.   Filed Weights   09/19/22 2020  Weight: 86.2 kg (190 lb)    Body mass index is 31.62 kg/m.  Estimated Creatinine Clearance: 79.3 mL/min (by C-G formula based on SCr of 0.88 mg/dL).   Based on [place] patient is candidate for enoxaparin 0.'5mg'$ /kg TBW SQ every 24 hours based on BMI being >30.  DESCRIPTION: Pharmacy has adjusted enoxaparin dose per [facility] policy.  Patient is now receiving enoxaparin 40 mg (approximately 0.5 mg/kg) every 24 hours    Glean Salvo, PharmD, BCPS Clinical Pharmacist  09/20/2022 10:47 AM

## 2022-09-20 NOTE — Hospital Course (Addendum)
Brittany Nguyen is a 57 y.o. female with medical history significant of seizure, QTc prolongation, hypertension, diet-controlled diabetes, COPD/asthma, CAD, myocardial infarction, CHF with EF of 45%, AICD, hypothyroidism, GERD, depression with anxiety, DVT not on anticoagulants (s/p of IVC filter placement per pt), carcinoid tumor of the stomach, chronic pain, who presents to ED 09/19/22 with seizure. used to be on Keppra which was discontinued 3 months ago due to QTc prolongation.  Patient is currently taking gabapentin and Vimpat 100 mg bid. Beakthrough seizure last in June 2023 at which time increase of Vimpat to 150 twice daily was recommended but in consultation with her outpatient neurologist she continued 100 mg twice daily due to infrequent breakthrough seizures.  01/15: Per report, pt had seizures at home that were generalized tonic-clonic for approximately 20 minutes per EMS. Pt was given 5 mg of IM Versed. She had not had any generalized tonic-clonic movements. On arrival in the ED she has some abnormal movements of the head but no other seizure-like activity.  CT of head is negative for acute intracranial abnormalities but showed left lateral scalp hematoma, CT of C-spine is negative for acute injury.  Patient is placed on PCU for observation.  Dr. Curly [place] of neurology consulted --> cont Vimpat, increase gabapentin. Later in the day pt had slight head shaking side-to-side, poorly responsive to sternal rub and trap squeeze, however brisk blink to saline stimulation to the eyes, not felt to be active seizure. Per neurology, suspect there is at least some component of psychogenic nonepileptic activity, however her poor responsiveness may also be due to recently administered Ativan. Ativan instructions as below.  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   Type 2 diabetes mellitus without complication (HCC)   HTN (hypertension)   COPD (chronic obstructive pulmonary disease) ([place])   Hypothyroidism   Depression with anxiety   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   OSA on CPAP   Morbid obesity with body mass index (BMI) of 50.0 to 59.9 in adult ([place])  Breakthrough Seizure ([place]): likely triggered by poor sleep vs electrolyte derangements  Somnolence and abnormal muscle activity w/ concern for nonepileptic psychogenic component vs medication effect  Continue Vimpat 100 mg bid (not increasing d/t potential effect on QT, see below) Increase gabapentin nightly to 300 mg to promote sleep  Address electrolyte derangements Ativan for seizure activity ongoing greater than 5 minutes with patient unresponsive to saline stimulation to the eyes or with significant sustained vital sign instabilities  Frequent episodes may necessitate transfer for continuous EEG     Hypokalemia:  Potassium 3.1 - can contribute to prolonged QT Magnesium 2.2 Repleted potassium Check phosphorus level  Hypocalcemia Appears chronic  May contribute to prolonged QT or seizures but uncertain since it is mildly decreased Labs: ionized Ca, PTH, VitD25 -->  Supplement IV per pharmacy    Prolonged QT interval: QTc 614.  Heart rate is 78 Avoid using QTc prolongation medication Question d/t hypokalemia, hypocalcemia  Hold Requip, Phenergan Patient is not taking Lexapro currently, do not restart this  Recheck EKG now that potassium improved -->  CAD (coronary artery disease):  Patient not taking aspirin or Statin hold metoprolol due to soft blood pressure   Chronic combined systolic and diastolic CHF  secondary to NICM s/p AICD:  No leg edema.  CHF seem to be compensated.   2D echo 5/28 8/19 showed EF of 45% Continue home Lasix 40 mg twice daily   Diet controlled type 2 diabetes mellitus without complication ([place]):  Recent A1c 6.3, well-controlled.  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  Body weight 88.9. Exercise and healthy diet Encourage losing weight     DVT prophylaxis: *** Pertinent IV fluids/nutrition: *** Central lines / invasive devices: ***  Code Status: ***  Current Admission Status: ***  TOC needs / Dispo plan: *** Barriers to discharge / significant pending items: ***

## 2022-09-20 NOTE — ED Notes (Signed)
Admitting provider made aware of seizure like activity.

## 2022-09-20 NOTE — Discharge Summary (Signed)
Physician Discharge Summary   Patient: Brittany Nguyen MRN: [id number]  DOB: [birth year]   Admit:     Date of Admission: 09/19/2022 Admitted from: home   Discharge: Date of discharge: 09/20/22 Disposition: Home Condition at discharge: good  CODE STATUS: FULL CODE      Discharge Physician: Emeterio Reeve, DO [facility]     PCP: [name], MD  Recommendations for Outpatient Follow-up:  Follow up with PCP [name], MD in 1-2 weeks Keep f/u appt w/ neurology  Please obtain labs/tests: BMP, CBC, Mg in 1-2 weeks Please follow up on the following pending results: Ionized Ca, VItamin D, PTH  PCP AND OTHER OUTPATIENT PROVIDERS: SEE BELOW FOR SPECIFIC DISCHARGE INSTRUCTIONS PRINTED FOR PATIENT IN ADDITION TO GENERIC AVS PATIENT INFO     Discharge Instructions     Diet - low sodium heart healthy   Complete by: As directed    Discharge instructions   Complete by: As directed    Follow as directed w/ neurology and w/ primary care for seizure care and to follow up on electrolyte levels (potassium, magnesium, calcium)   Increase activity slowly   Complete by: As directed          Discharge Diagnoses: Principal Problem:   Seizure ([place]) Active Problems:   Hypokalemia   Prolonged QT interval   CAD (coronary artery disease)   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 female with medical history significant of seizure, QTc prolongation, hypertension, diet-controlled diabetes, COPD/asthma, CAD, myocardial infarction, CHF with EF of 45%, AICD,  hypothyroidism, GERD, depression with anxiety, DVT not on anticoagulants (s/p of IVC filter placement per pt), carcinoid tumor of the stomach, chronic pain, who presents to ED 09/19/22 with seizure. used to be on Keppra which was discontinued 3 months ago due to QTc prolongation.  Patient is currently taking gabapentin and Vimpat 100 mg bid. [place] seizure last in June 2023 at which time increase of Vimpat to 150 twice daily was recommended but in consultation with her outpatient neurologist she continued 100 mg twice daily due to infrequent breakthrough seizures.  01/15: Per report, pt had seizures at home that were generalized tonic-clonic for approximately 20 minutes per EMS. Pt was given 5 mg of IM Versed. She had not had any generalized tonic-clonic movements. On arrival in the ED she has some abnormal movements of the head but no other seizure-like activity.  CT of head is negative for acute intracranial abnormalities but showed left lateral scalp hematoma, CT of C-spine is negative for acute injury.  Patient is placed on PCU for observation.  Dr. Curly Sh[place] of neurology consulted --> cont Vimpat, increase gabapentin. Later in the day pt had slight head shaking side-to-side, poorly responsive to sternal rub and trap squeeze, however brisk blink to saline stimulation to the eyes, not felt to be active seizure. Per neurology, suspect there is at least some component of psychogenic nonepileptic activity, however her poor responsiveness may also be due to recently administered Ativan. Ativan instructions as below.  01/16: potassium  corrected, repletion calcium, working up calcium deficiency which is mild but may be contributing to lower seizure threshold and prolonged QT. 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   OSA on CPAP   Morbid obesity with body mass index (BMI) of 50.0 to 59.9 in adult ([place])  Breakthrough Seizure ([place]): likely triggered by poor sleep vs electrolyte derangements  Somnolence and abnormal muscle activity w/ concern for nonepileptic psychogenic neural activity vs medication effect  Continue Vimpat 100 mg bid (not increasing d/t potential effect on QT, see below) Increase gabapentin nightly to 300 mg to promote sleep  Address electrolyte derangements Outpatient neurology f/u     Hypokalemia:  Potassium 3.1 - can contribute to prolonged QT Magnesium 2.2 Repleted potassium Check phosphorus level  Hypocalcemia Appears chronic  May contribute to prolonged QT or seizures but uncertain since it is mildly decreased Labs: ionized Ca, PTH, VitD25 --> follow outpatient  Supplement IV per pharmacy    Prolonged QT interval: QTc 614.  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  CHF seem to be compensated.   2D echo 5/28 8/19 showed EF of 45% Continue home Lasix 40 mg twice daily   Diet controlled type 2 diabetes mellitus without complication ([place]):  Recent A1c 6.3, well-controlled.  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  Body weight 88.9. Exercise and healthy diet Encourage losing weight             Discharge Instructions  Allergies as of 09/20/2022       Reactions   Contrast Media [iodinated Contrast Media] Shortness Of Breath   Per CT report in 2014 pt had break through contrast reaction with hives and SOB following 13 hour prep. MSY    Ferumoxytol Nausea Only   Iron Sucrose Anaphylaxis   Lidocaine Hives   Metrizamide Shortness Of Breath   Penicillins Hives, Other (See Comments)   IgE = 4 (WNL) on 08/12/2022 ediate rash, facial/tongue/throat swelling, SOB or lightheadedness with hypotension, tachy   Sacubitril-valsartan    Sz (unclear if new start entresto vs hypoglycemia)   Isosorbide  Nitrate Other (See Comments)   Headache   Latex Hives   Ondansetron Other (See Comments)   Severe headache   Tizanidine Other (See Comments)   Feels altered   Povidone-iodine Rash   Blistering rash   Pulmicort [budesonide] Itching        Medication List     STOP taking these medications    benzonatate 100 MG capsule Commonly known as: Tessalon Perles   enoxaparin 40 MG/0.4ML injection Commonly known as: LOVENOX   escitalopram 10 MG tablet Commonly known as: LEXAPRO   lanreotide acetate 120 MG/0.5ML injection Commonly known as: SOMATULINE DEPOT   losartan 25 MG tablet Commonly known as: COZAAR   predniSONE 10 MG tablet Commonly known as: DELTASONE       TAKE these medications    acetaminophen 500 MG tablet Commonly known as:  TYLENOL Take 2 tablets (1,000 mg total) by mouth every 6 (six) hours as needed.   albuterol 108 (90 Base) MCG/ACT inhaler Commonly known as: VENTOLIN HFA Inhale 1-2 puffs into the lungs every 6 (six) hours as needed for wheezing or shortness of breath.   albuterol 1.25 MG/3ML nebulizer solution Commonly known as: ACCUNEB Take 1 ampule by nebulization every 6 (six) hours as needed for wheezing.   calcium-vitamin D 500-5 MG-MCG tablet Commonly known as: OSCAL WITH D Take 1 tablet by mouth daily with breakfast.   cyclobenzaprine 5 MG tablet Commonly known as: FLEXERIL Take 5 mg by mouth 4 (four) times daily as needed.   fentaNYL 75 MCG/HR Commonly known as: [place] 1 patch onto the skin every 3 (three) days.   furosemide 40 MG tablet Commonly known as: LASIX Take 40 mg by mouth 2 (two) times daily.   gabapentin 300 MG capsule Commonly known as: NEURONTIN Take 1 capsule (300 mg total) by mouth at bedtime. What changed:  medication strength how much to take   Lacosamide 100 MG Tabs Take 1 tablet by mouth 2 (two) times daily.   levothyroxine 175 MCG tablet Commonly known as: SYNTHROID Take 175 mcg by mouth every morning.   LORazepam 1 MG tablet Commonly known as: ATIVAN Take 1 tablet (1 mg total) by mouth 4 (four) times daily as needed.   magnesium oxide 400 MG tablet Commonly known as: MAG-OX Take 1 tablet (400 mg total) by mouth daily.   Melatonin 3 MG Caps Take 1-2 capsules (3-6 mg total) by mouth at bedtime.   metoprolol succinate 50 MG 24 hr tablet Commonly known as: TOPROL-XL Take 50 mg by mouth 2 (two) times daily.   morphine 15 MG tablet Commonly known as: MSIR Take 15 mg by mouth every 4 (four) hours as needed.   naloxone 4 MG/0.1ML Liqd nasal spray kit Commonly known as: NARCAN Place 1 spray into the nose as needed for opioid reversal. May repeat once in opposite nare.   nitroGLYCERIN 0.4 MG SL tablet Commonly known as: NITROSTAT Place 0.4  mg under the tongue every 5 (five) minutes x 3 doses as needed for chest pain.   oxyCODONE-acetaminophen 5-325 MG tablet Commonly known as: PERCOCET/ROXICET Take 1-2 tablets by mouth every 4 (four) hours as needed (breakthrough pain).   Potassium Chloride ER 20 MEQ Tbcr Take 1 tablet by mouth in the morning and at bedtime.   promethazine 25 MG tablet Commonly known as: PHENERGAN Take 25 mg by mouth every 4 (four) hours as needed for nausea/vomiting.   rOPINIRole 4 MG tablet Commonly known as: REQUIP Take 4 mg by mouth at  bedtime.          Allergies  Allergen Reactions   Contrast Media [Iodinated Contrast Media] Shortness Of Breath    Per CT report in 2014 pt had break through contrast reaction with hives and SOB following 13 hour prep. MSY    Ferumoxytol Nausea Only   Iron Sucrose Anaphylaxis   Lidocaine Hives   Metrizamide Shortness Of Breath   Penicillins Hives and Other (See Comments)    IgE = 4 (WNL) on 08/12/2022  ediate rash, facial/tongue/throat swelling, SOB or lightheadedness with hypotension, tachy   Sacubitril-Valsartan     Sz (unclear if new start entresto vs hypoglycemia)   Isosorbide Nitrate Other (See Comments)    Headache   Latex Hives   Ondansetron Other (See Comments)    Severe headache   Tizanidine Other (See Comments)    Feels altered   Povidone-Iodine Rash    Blistering rash   Pulmicort [Budesonide] Itching     Subjective: pt feeling well and no concerns for discharge, asks about sleep medications    Discharge Exam: BP 99/81   Pulse (!) 102   Temp 98.1 F (36.7 C)   Resp 15   Ht '5\' 5"'$  (1.651 m)   Wt 86.2 kg   LMP 01/22/2012 (Approximate) Comment: Hysterectomy 5 years ago  SpO2 98%   BMI 31.62 kg/m  General: Pt is alert, awake, not in acute distress Cardiovascular: RRR, S1/S2 +, no rubs, no gallops Respiratory: CTA bilaterally, no wheezing, no rhonchi Abdominal: Soft, NT, ND, bowel sounds + Extremities: no edema, no  cyanosis     The results of significant diagnostics from this hospitalization (including imaging, microbiology, ancillary and laboratory) are listed below for reference.     Microbiology: Recent Results (from the past 240 hour(s))  Resp panel by RT-PCR (RSV, Flu A&B, Covid) Anterior Nasal Swab     Status: None   Collection Time: 09/11/22 10:24 AM   Specimen: Anterior Nasal Swab  Result Value Ref Range Status   SARS Coronavirus 2 by RT PCR NEGATIVE NEGATIVE Final    Comment: (NOTE) SARS-CoV-2 target nucleic acids are NOT DETECTED.  The SARS-CoV-2 RNA is generally detectable in upper respiratory specimens during the acute phase of infection. The lowest concentration of SARS-CoV-2 viral copies this assay can detect is 138 copies/mL. A negative result does not preclude SARS-Cov-2 infection and should not be used as the sole basis for treatment or other patient management decisions. A negative result may occur with  improper specimen collection/handling, submission of specimen other than nasopharyngeal swab, presence of viral mutation(s) within the areas targeted by this assay, and inadequate number of viral copies(<138 copies/mL). A negative result must be combined with clinical observations, patient history, and epidemiological information. The expected result is Negative.  Fact Sheet for Patients:  EntrepreneurPulse.com.au  Fact Sheet for Healthcare Providers:  IncredibleEmployment.be  This test is no t yet approved or cleared by the [place] FDA and  has been authorized for detection and/or diagnosis of SARS-CoV-2 by FDA under an Emergency Use Authorization (EUA). This EUA will remain  in effect (meaning this test can be used) for the duration of the COVID-19 declaration under Section 564(b)(1) of the Act, 21 U.S.C.section 360bbb-3(b)(1), unless the authorization is terminated  or revoked sooner.       Influenza A by PCR NEGATIVE  NEGATIVE Final   Influenza B by PCR NEGATIVE NEGATIVE Final    Comment: (NOTE) The Xpert Xpress SARS-CoV-2/FLU/RSV plus assay is intended as an aid  in the diagnosis of influenza from Nasopharyngeal swab specimens and should not be used as a sole basis for treatment. Nasal washings and aspirates are unacceptable for Xpert Xpress SARS-CoV-2/FLU/RSV testing.  Fact Sheet for Patients: EntrepreneurPulse.com.au  Fact Sheet for Healthcare Providers: IncredibleEmployment.be  This test is not yet approved or cleared by the [place] FDA and has been authorized for detection and/or diagnosis of SARS-CoV-2 by FDA under an Emergency Use Authorization (EUA). This EUA will remain in effect (meaning this test can be used) for the duration of the COVID-19 declaration under Section 564(b)(1) of the Act, 21 U.S.C. section 360bbb-3(b)(1), unless the authorization is terminated or revoked.     Resp Syncytial Virus by PCR NEGATIVE NEGATIVE Final    Comment: (NOTE) Fact Sheet for Patients: EntrepreneurPulse.com.au  Fact Sheet for Healthcare Providers: IncredibleEmployment.be  This test is not yet approved or cleared by the [place] FDA and has been authorized for detection and/or diagnosis of SARS-CoV-2 by FDA under an Emergency Use Authorization (EUA). This EUA will remain in effect (meaning this test can be used) for the duration of the COVID-19 declaration under Section 564(b)(1) of the Act, 21 U.S.C. section 360bbb-3(b)(1), unless the authorization is terminated or revoked.  Performed at [facility], [place], [place] [address]   Respiratory (~20 pathogens) panel by PCR     Status: None   Collection Time: 09/11/22  2:16 PM   Specimen: Nasopharyngeal Swab; Respiratory  Result Value Ref Range Status   Adenovirus NOT DETECTED NOT DETECTED Final   Coronavirus 229E NOT DETECTED NOT  DETECTED Final    Comment: (NOTE) The Coronavirus on the Respiratory Panel, DOES NOT test for the novel  Coronavirus (2019 nCoV)    Coronavirus HKU1 NOT DETECTED NOT DETECTED Final   Coronavirus NL63 NOT DETECTED NOT DETECTED Final   Coronavirus OC43 NOT DETECTED NOT DETECTED Final   Metapneumovirus NOT DETECTED NOT DETECTED Final   Rhinovirus / Enterovirus NOT DETECTED NOT DETECTED Final   Influenza A NOT DETECTED NOT DETECTED Final   Influenza B NOT DETECTED NOT DETECTED Final   Parainfluenza Virus 1 NOT DETECTED NOT DETECTED Final   Parainfluenza Virus 2 NOT DETECTED NOT DETECTED Final   Parainfluenza Virus 3 NOT DETECTED NOT DETECTED Final   Parainfluenza Virus 4 NOT DETECTED NOT DETECTED Final   Respiratory Syncytial Virus NOT DETECTED NOT DETECTED Final   Bordetella pertussis NOT DETECTED NOT DETECTED Final   Bordetella Parapertussis NOT DETECTED NOT DETECTED Final   Chlamydophila pneumoniae NOT DETECTED NOT DETECTED Final   Mycoplasma pneumoniae NOT DETECTED NOT DETECTED Final    Comment: Performed at [facility], [place]. [address], [place], [place] [address]     Labs: BNP (last 3 results) Recent Labs    09/11/22 1022 09/19/22 0548  BNP 16.8 89.3   Basic Metabolic Panel: Recent Labs  Lab 09/19/22 0548 09/19/22 1527 09/20/22 0457  NA 140 139 138  K 3.1* 3.6 3.6  CL 99 100 103  CO2 '31 28 27  '$ GLUCOSE 131* 164* 110*  BUN '10 11 13  '$ CREATININE 1.05* 1.09* 0.88  CALCIUM 8.0* 7.5* 7.2*  MG 2.2 2.1  --   PHOS 6.3*  --   --    Liver Function Tests: Recent Labs  Lab 09/19/22 0548  AST 22  ALT 12  ALKPHOS 90  BILITOT 0.5  PROT 7.2  ALBUMIN 3.8   No results for input(s): "LIPASE", "AMYLASE" in the last 168 hours. No results for  input(s): "AMMONIA" in the last 168 hours. CBC: Recent Labs  Lab 09/19/22 0548 09/20/22 0457  WBC 6.2 5.7  NEUTROABS 3.5  --   HGB 11.2* 11.1*  HCT 34.8* 34.2*  MCV 92.8 92.7  PLT 219 179   Cardiac Enzymes: No results  for input(s): "CKTOTAL", "CKMB", "CKMBINDEX", "TROPONINI" in the last 168 hours. BNP: Invalid input(s): "POCBNP" CBG: Recent Labs  Lab 09/19/22 0535 09/20/22 0815  GLUCAP 116* 121*   D-Dimer No results for input(s): "DDIMER" in the last 72 hours. Hgb A1c No results for input(s): "HGBA1C" in the last 72 hours. Lipid Profile No results for input(s): "CHOL", "HDL", "LDLCALC", "TRIG", "CHOLHDL", "LDLDIRECT" in the last 72 hours. Thyroid function studies No results for input(s): "TSH", "T4TOTAL", "T3FREE", "THYROIDAB" in the last 72 hours.  Invalid input(s): "FREET3" Anemia work up No results for input(s): "VITAMINB12", "FOLATE", "FERRITIN", "TIBC", "IRON", "RETICCTPCT" in the last 72 hours. Urinalysis    Component Value Date/Time   COLORURINE YELLOW (A) 09/19/2022 1258   APPEARANCEUR CLEAR (A) 09/19/2022 1258   APPEARANCEUR Clear 09/02/2014 2312   LABSPEC 1.012 09/19/2022 1258   LABSPEC 1.013 09/02/2014 2312   PHURINE 5.0 09/19/2022 1258   GLUCOSEU NEGATIVE 09/19/2022 1258   GLUCOSEU Negative 09/02/2014 2312   HGBUR SMALL (A) 09/19/2022 1258   BILIRUBINUR NEGATIVE 09/19/2022 1258   BILIRUBINUR Negative 09/02/2014 2312   KETONESUR NEGATIVE 09/19/2022 1258   PROTEINUR NEGATIVE 09/19/2022 1258   NITRITE NEGATIVE 09/19/2022 1258   LEUKOCYTESUR TRACE (A) 09/19/2022 1258   LEUKOCYTESUR Negative 09/02/2014 2312   Sepsis Labs Recent Labs  Lab 09/19/22 0548 09/20/22 0457  WBC 6.2 5.7   Microbiology Recent Results (from the past 240 hour(s))  Resp panel by RT-PCR (RSV, Flu A&B, Covid) Anterior Nasal Swab     Status: None   Collection Time: 09/11/22 10:24 AM   Specimen: Anterior Nasal Swab  Result Value Ref Range Status   SARS Coronavirus 2 by RT PCR NEGATIVE NEGATIVE Final    Comment: (NOTE) SARS-CoV-2 target nucleic acids are NOT DETECTED.  The SARS-CoV-2 RNA is generally detectable in upper respiratory specimens during the acute phase of infection. The  lowest concentration of SARS-CoV-2 viral copies this assay can detect is 138 copies/mL. A negative result does not preclude SARS-Cov-2 infection and should not be used as the sole basis for treatment or other patient management decisions. A negative result may occur with  improper specimen collection/handling, submission of specimen other than nasopharyngeal swab, presence of viral mutation(s) within the areas targeted by this assay, and inadequate number of viral copies(<138 copies/mL). A negative result must be combined with clinical observations, patient history, and epidemiological information. The expected result is Negative.  Fact Sheet for Patients:  EntrepreneurPulse.com.au  Fact Sheet for Healthcare Providers:  IncredibleEmployment.be  This test is no t yet approved or cleared by the [place] FDA and  has been authorized for detection and/or diagnosis of SARS-CoV-2 by FDA under an Emergency Use Authorization (EUA). This EUA will remain  in effect (meaning this test can be used) for the duration of the COVID-19 declaration under Section 564(b)(1) of the Act, 21 U.S.C.section 360bbb-3(b)(1), unless the authorization is terminated  or revoked sooner.       Influenza A by PCR NEGATIVE NEGATIVE Final   Influenza B by PCR NEGATIVE NEGATIVE Final    Comment: (NOTE) The Xpert Xpress SARS-CoV-2/FLU/RSV plus assay is intended as an aid in the diagnosis of influenza from Nasopharyngeal swab specimens and should not be used as  a sole basis for treatment. Nasal washings and aspirates are unacceptable for Xpert Xpress SARS-CoV-2/FLU/RSV testing.  Fact Sheet for Patients: EntrepreneurPulse.com.au  Fact Sheet for Healthcare Providers: IncredibleEmployment.be  This test is not yet approved or cleared by the [place] FDA and has been authorized for detection and/or diagnosis of SARS-CoV-2 by FDA under  an Emergency Use Authorization (EUA). This EUA will remain in effect (meaning this test can be used) for the duration of the COVID-19 declaration under Section 564(b)(1) of the Act, 21 U.S.C. section 360bbb-3(b)(1), unless the authorization is terminated or revoked.     Resp Syncytial Virus by PCR NEGATIVE NEGATIVE Final    Comment: (NOTE) Fact Sheet for Patients: EntrepreneurPulse.com.au  Fact Sheet for Healthcare Providers: IncredibleEmployment.be  This test is not yet approved or cleared by the [place] FDA and has been authorized for detection and/or diagnosis of SARS-CoV-2 by FDA under an Emergency Use Authorization (EUA). This EUA will remain in effect (meaning this test can be used) for the duration of the COVID-19 declaration under Section 564(b)(1) of the Act, 21 U.S.C. section 360bbb-3(b)(1), unless the authorization is terminated or revoked.  Performed at [facility], [place], [place] [address]   Respiratory (~20 pathogens) panel by PCR     Status: None   Collection Time: 09/11/22  2:16 PM   Specimen: Nasopharyngeal Swab; Respiratory  Result Value Ref Range Status   Adenovirus NOT DETECTED NOT DETECTED Final   Coronavirus 229E NOT DETECTED NOT DETECTED Final    Comment: (NOTE) The Coronavirus on the Respiratory Panel, DOES NOT test for the novel  Coronavirus (2019 nCoV)    Coronavirus HKU1 NOT DETECTED NOT DETECTED Final   Coronavirus NL63 NOT DETECTED NOT DETECTED Final   Coronavirus OC43 NOT DETECTED NOT DETECTED Final   Metapneumovirus NOT DETECTED NOT DETECTED Final   Rhinovirus / Enterovirus NOT DETECTED NOT DETECTED Final   Influenza A NOT DETECTED NOT DETECTED Final   Influenza B NOT DETECTED NOT DETECTED Final   Parainfluenza Virus 1 NOT DETECTED NOT DETECTED Final   Parainfluenza Virus 2 NOT DETECTED NOT DETECTED Final   Parainfluenza Virus 3 NOT DETECTED NOT DETECTED Final    Parainfluenza Virus 4 NOT DETECTED NOT DETECTED Final   Respiratory Syncytial Virus NOT DETECTED NOT DETECTED Final   Bordetella pertussis NOT DETECTED NOT DETECTED Final   Bordetella Parapertussis NOT DETECTED NOT DETECTED Final   Chlamydophila pneumoniae NOT DETECTED NOT DETECTED Final   Mycoplasma pneumoniae NOT DETECTED NOT DETECTED Final    Comment: Performed at [facility], [place]. [address], [place], [place] [address]   Imaging CT Cervical Spine Wo Contrast  Result Date: 09/19/2022 CLINICAL DATA:  57 year old female status post witness seizure. Postictal. Headache. EXAM: CT CERVICAL SPINE WITHOUT CONTRAST TECHNIQUE: Multidetector CT imaging of the cervical spine was performed without intravenous contrast. Multiplanar CT image reconstructions were also generated. RADIATION DOSE REDUCTION: This exam was performed according to the departmental dose-optimization program which includes automated exposure control, adjustment of the mA and/or kV according to patient size and/or use of iterative reconstruction technique. COMPARISON:  Head CT today.  Neck CT 12/06/2020. FINDINGS: Alignment: Chronic straightening of cervical lordosis is stable since 2022. Cervicothoracic junction alignment is within normal limits. Bilateral posterior element alignment is within normal limits. Skull base and vertebrae: Visualized skull base is intact. No atlanto-occipital dissociation. C1 and C2 appear stable and intact, aligned. No acute osseous abnormality identified. Soft tissues and spinal canal: No prevertebral fluid or  swelling. No visible canal hematoma. Partially retropharyngeal course of both carotids more so the left. Mild larynx motion artifact. Surgically absent thyroid. Otherwise negative visible noncontrast neck soft tissues. Chronic carious posterior mandible dentition. Disc levels: Chronic disc bulging. But mild for age degeneration overall. No cervical spinal stenosis suspected. Upper chest: Partially  visible left chest pacemaker. Stable lung apices with mild scarring. Visible upper thoracic levels appear intact. IMPRESSION: No acute traumatic injury identified in the cervical spine. Electronically Signed   By: Genevie Ann M.D.   On: 09/19/2022 06:27   CT HEAD WO CONTRAST (5MM)  Result Date: 09/19/2022 CLINICAL DATA:  57 year old female status post witness seizure. Postictal. Headache. EXAM: CT HEAD WITHOUT CONTRAST TECHNIQUE: Contiguous axial images were obtained from the base of the skull through the vertex without intravenous contrast. RADIATION DOSE REDUCTION: This exam was performed according to the departmental dose-optimization program which includes automated exposure control, adjustment of the mA and/or kV according to patient size and/or use of iterative reconstruction technique. COMPARISON:  Brain MRI 06/22/2018.  Head CT 07/30/2022. FINDINGS: Brain: Cerebral volume is stable and within normal limits. No midline shift, ventriculomegaly, mass effect, evidence of mass lesion, intracranial hemorrhage or evidence of cortically based acute infarction. Gray-white matter differentiation is within normal limits throughout the brain. No encephalomalacia identified. Vascular: No suspicious intracranial vascular hyperdensity. Skull: No acute osseous abnormality identified. Hyperostosis of the calvarium, normal variant. Sinuses/Orbits: Left sphenoid and ethmoid sinus aeration has improved since November. Residual bubbly opacity in the right frontal sinus. Incidental left frontoethmoidal sinus osteoma (chronic). Tympanic cavities and mastoids appear clear. Other: Mild left lateral scalp hematoma or contusion series 3, image 58. No scalp soft tissue gas. No orbits soft tissue injury identified. IMPRESSION: 1. Mild left lateral scalp hematoma or contusion. No underlying skull fracture. 2. Stable and normal for age noncontrast CT appearance of the brain. 3. Chronic paranasal sinus disease, mildly improved since  November. Electronically Signed   By: Genevie Ann M.D.   On: 09/19/2022 06:23      Time coordinating discharge: over 30 minutes  SIGNED:  Emeterio Reeve DO [facility]

## 2022-09-21 LAB — PARATHYROID HORMONE, INTACT (NO CA): PTH: 69 pg/mL — ABNORMAL HIGH (ref 15–65)

## 2022-09-21 LAB — CALCIUM, IONIZED: Calcium, Ionized, Serum: 4.3 mg/dL — ABNORMAL LOW (ref 4.5–5.6)

## 2022-09-25 NOTE — Progress Notes (Signed)
MRN : [id number]  Brittany Nguyen is a 57 y.o. (August 21, 1966) female who presents with chief complaint of legs hurt and swell.  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  DVT was identified years ago and was treated with anticoagulation.  The presenting symptoms were pain and swelling in the lower extremity.  IVC filter was placed on 08/23/2022: Placement of a option Elite IVC filter   She is now s/p  Left total knee arthroplasty using computer-assisted navigation on 08/24/2022  She notes the surgery went well and is progressing with PT.  The patient notes minimal edema in the morning which steadily worsens throughout the day.    The patient has not been using compression therapy at this point.  No SOB or pleuritic chest pains.  No cough or hemoptysis.  No blood per rectum or blood in any sputum.  No excessive bruising per the patient.    No recent shortening of the patient's walking distance or new symptoms consistent with claudication.  No history of rest pain symptoms. No new ulcers or wounds of the lower extremities have occurred.  The patient denies amaurosis fugax or recent TIA symptoms. There are no recent neurological changes noted. No recent episodes of angina or shortness of breath documented.   No outpatient medications have been marked as taking for the 09/26/22 encounter (Appointment) with Delana Meyer, Dolores Lory, MD.    Past Medical History:  Diagnosis Date   Acute anterolateral wall MI ([place]) 08/2016   AICD (automatic cardioverter/defibrillator) present 03/29/2017   a.) Medtronic Evera MRI XT DR SureScan (SN: [id number])   Anxiety    Arthritis    Asthma 2013   C. 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   a.) Bx 02/22/2018 --> NE tumor cells --> AE1/AE3, chromogranin, synaptophysin (+); Ki-67  proliferation rate <1%; b.) repeat Bx 08/24/2018 --> well differentiard NET (G1)   Chronic, continuous use of opioids    a.) has naloxone Rx available   COPD (chronic obstructive pulmonary disease) ([place])    Depression    Diet-controlled type 2 diabetes mellitus ([place])    Diverticulitis 2010   DVT (deep venous thrombosis) ([place])    Endometriosis 1990   Generalized epilepsy ([place])    a.) on lacosamide; b.) last seizure 07-30-22 in setting of hypokalemia (K+ 2.6)   GERD (gastroesophageal reflux disease)    GIB (gastrointestinal bleeding) 01/08/2018   HFrEF (heart failure with reduced ejection fraction) ([place])    a.)TTE 12/09/16: EF 25-30%, dif HK; b.) TTE 02/08/17: EF 25%, dif HK, mild TR, G1DD; c.) TTE 04/25/17: EF 30-35%, mild LVH; d.) TTE 01/30/18: EF 45%, MAC, LAE, mild MR, G1DD; e.) TTE 11/06/2018: EF 25%, sev glob HK, triv MR/TR/PR; f.) TTE 05/12/19: EF 35%, MAC, AoV sclerosis, G1DD; g.) TTE 11/05/19: EF 20%, sev glob HK, triv MR; h.) TTE 06/17/20: EF 40%, mod glob HK, triv MR; I:) TTE 06/14/22: EF 25-30%, G1DD   History of cardiac catheterization    a.) LHC 10/21/2014: normal coronaries; b.) R/LHC 02/24/2017: normal coronaries, mRA 12, mPA 28, mPCWP 15, CO 8.0, CI 3.8   History of kidney stones    Hx MRSA infection    Hypokalemia    Hypothyroidism    Iron deficiency anemia 03/27/2018   Lump or mass in breast 11/27/2012   a.)  Bx 11/27/2012 --> apocrine wall cyst   Migraine    Nausea & vomiting    Neuropathy    Non-ischemic cardiomyopathy ([place])    a.) TTE 12/09/2016: EF 25-30%, b.) TTE 02/08/2017: EF 25%; c.) Medtronic AICD placed 03/29/2017; d.) TTE 04/25/2017: EF 30-35%; e.) TTE 01/30/2018: EF 45%; f.) TTE 11/06/2018: EF 25%; g.) TTE 05/12/2019: EF 35%; h.) TTE 11/05/2019: EF 20%; I.) TTE 06/17/2020: EF 40%; J.) TTE 06/14/2022: EF 25-30%   Obesity    OSA on CPAP    PAF (paroxysmal atrial fibrillation) (HCC)    PONV (postoperative nausea and vomiting)    Post-COVID chronic cough     Postcholecystectomy diarrhea    Prolonged Q-T interval on ECG    Restless leg    a.) on ropinirole   Unstable angina ([place])     Past Surgical History:  Procedure Laterality Date   ABDOMINAL HYSTERECTOMY     age 16   BREAST BIOPSY Right 2014   benign   CARDIAC DEFIBRILLATOR PLACEMENT Left 03/29/2017   Procedure: [place] (AICD); Location: [facility]; Surgeon: Burnett Sheng, MD   CESAREAN SECTION     CHOLECYSTECTOMY     COLECTOMY     COLONOSCOPY WITH PROPOFOL N/A 02/22/2018   Procedure: COLONOSCOPY WITH PROPOFOL;  Surgeon: Toledo, Benay Pike, MD;  Location: [facility];  Service: Gastroenterology;  Laterality: N/A;   ESOPHAGOGASTRODUODENOSCOPY (EGD) WITH PROPOFOL N/A 02/22/2018   Procedure: ESOPHAGOGASTRODUODENOSCOPY (EGD) WITH PROPOFOL;  Surgeon: Toledo, Benay Pike, MD;  Location: [facility];  Service: Gastroenterology;  Laterality: N/A;   IVC FILTER INSERTION N/A 08/23/2022   Procedure: IVC FILTER INSERTION;  Surgeon: Katha Cabal, MD;  Location: [facility];  Service: Cardiovascular;  Laterality: N/A;   KNEE ARTHROPLASTY Left 08/24/2022   Procedure: COMPUTER ASSISTED TOTAL KNEE ARTHROPLASTY;  Surgeon: Dereck Leep, MD;  Location: [facility];  Service: Orthopedics;  Laterality: Left;   LEFT HEART CATH AND CORONARY ANGIOGRAPHY Left 10/21/2014   Procedure: LEFT HEART CATH AND CORONARY ANGIOGRAPHY; Location: [facility]; Surgeon: Serafina Royals, MD   NASAL SINUS SURGERY  2012   OOPHORECTOMY     RIGHT/LEFT HEART CATH AND CORONARY ANGIOGRAPHY Bilateral 02/24/2017   Procedure: RIGHT/LEFT HEART CATH AND CORONARY ANGIOGRAPHY; Location: [facility]   THYROIDECTOMY      Social History Social History   Tobacco Use   Smoking status: Never   Smokeless tobacco: Never  Vaping Use   Vaping Use: Never used  Substance Use Topics   Alcohol use: No   Drug use: Never    Family History Family History  Problem Relation Age of Onset   Cancer Mother 84        ovarian   CAD Mother    Cancer Father 5       brain   CAD Father    Cancer Daughter 44       skin   Cancer Maternal Aunt 34       breast   Leukemia Paternal Grandfather     Allergies  Allergen Reactions   Contrast Media [Iodinated Contrast Media] Shortness Of Breath    Per CT report in 2014 pt had break through contrast reaction with hives and SOB following 13 hour prep. MSY    Ferumoxytol Nausea Only   Iron Sucrose Anaphylaxis   Lidocaine Hives   Metrizamide Shortness Of Breath   Penicillins Hives and Other (See Comments)    IgE = 4 (WNL) on 08/12/2022  ediate rash, facial/tongue/throat swelling, SOB or lightheadedness with  hypotension, tachy   Sacubitril-Valsartan     Sz (unclear if new start entresto vs hypoglycemia)   Isosorbide Nitrate Other (See Comments)    Headache   Latex Hives   Ondansetron Other (See Comments)    Severe headache   Tizanidine Other (See Comments)    Feels altered   Povidone-Iodine Rash    Blistering rash   Pulmicort [Budesonide] Itching     REVIEW OF SYSTEMS (Negative unless checked)  Constitutional: [] Weight loss  [] Fever  [] Chills Cardiac: [] Chest pain   [] Chest pressure   [] Palpitations   [] Shortness of breath when laying flat   [] Shortness of breath with exertion. Vascular:  [] Pain in legs with walking   [x] Pain in legs at rest  [] History of DVT   [] Phlebitis   [x] Swelling in legs   [] Varicose veins   [] Non-healing ulcers Pulmonary:   [] Uses home oxygen   [] Productive cough   [] Hemoptysis   [] Wheeze  [] COPD   [] Asthma Neurologic:  [] Dizziness   [] Seizures   [] History of stroke   [] History of TIA  [] Aphasia   [] Vissual changes   [] Weakness or numbness in arm   [] Weakness or numbness in leg Musculoskeletal:   [] Joint swelling   [] Joint pain   [] Low back pain Hematologic:  [] Easy bruising  [] Easy bleeding   [] Hypercoagulable state   [] Anemic Gastrointestinal:  [] Diarrhea   [] Vomiting  [] Gastroesophageal reflux/heartburn   [] Difficulty  swallowing. Genitourinary:  [] Chronic kidney disease   [] Difficult urination  [] Frequent urination   [] Blood in urine Skin:  [] Rashes   [] Ulcers  Psychological:  [] History of anxiety   []  History of major depression.  Physical Examination  There were no vitals filed for this visit. There is no height or weight on file to calculate BMI. Gen: WD/WN, NAD Head: [place]/AT, No temporalis wasting.  Ear/Nose/Throat: Hearing grossly intact, nares w/o erythema or drainage, pinna without lesions Eyes: PER, EOMI, sclera nonicteric.  Neck: Supple, no gross masses.  No JVD.  Pulmonary:  Good air movement, no audible wheezing, no use of accessory muscles.  Cardiac: RRR, precordium not hyperdynamic. Vascular:  scattered varicosities present bilaterally.  Moderate venous stasis changes to the legs bilaterally.  2+ soft pitting edema  Vessel Right Left  Radial Palpable Palpable  Gastrointestinal: soft, non-distended. No guarding/no peritoneal signs.  Musculoskeletal: M/S 5/5 throughout.  No deformity.  Neurologic: CN 2-12 intact. Pain and light touch intact in extremities.  Symmetrical.  Speech is fluent. Motor exam as listed above. Psychiatric: Judgment intact, Mood & affect appropriate for pt's clinical situation. Dermatologic: Venous rashes no ulcers noted.  No changes consistent with cellulitis. Lymph : No lichenification or skin changes of chronic lymphedema.  CBC Lab Results  Component Value Date   WBC 5.7 09/20/2022   HGB 11.1 (L) 09/20/2022   HCT 34.2 (L) 09/20/2022   MCV 92.7 09/20/2022   PLT 179 09/20/2022    BMET    Component Value Date/Time   NA 138 09/20/2022 0457   NA 139 10/03/2014 1838   K 3.6 09/20/2022 0457   K 3.9 10/03/2014 1838   CL 103 09/20/2022 0457   CL 106 10/03/2014 1838   CO2 27 09/20/2022 0457   CO2 28 10/03/2014 1838   GLUCOSE 110 (H) 09/20/2022 0457   GLUCOSE 118 (H) 10/03/2014 1838   BUN 13 09/20/2022 0457   BUN 11 10/03/2014 1838   CREATININE 0.88  09/20/2022 0457   CREATININE 1.03 10/03/2014 1838   CALCIUM 7.2 (L) 09/20/2022 0457   CALCIUM  9.0 10/03/2014 1838   GFRNONAA >60 09/20/2022 0457   GFRNONAA >60 10/03/2014 1838   GFRNONAA 58 (L) 04/16/2014 0525   GFRAA >60 04/28/2020 1342   GFRAA >60 10/03/2014 1838   GFRAA >60 04/16/2014 0525   Estimated Creatinine Clearance: 79.3 mL/min (by C-G formula based on SCr of 0.88 mg/dL).  COAG Lab Results  Component Value Date   INR 1.1 09/11/2022   INR 1.0 09/27/2021   INR 0.95 10/29/2017    Radiology CT Cervical Spine Wo Contrast  Result Date: 09/19/2022 CLINICAL DATA:  57 year old female status post witness seizure. Postictal. Headache. EXAM: CT CERVICAL SPINE WITHOUT CONTRAST TECHNIQUE: Multidetector CT imaging of the cervical spine was performed without intravenous contrast. Multiplanar CT image reconstructions were also generated. RADIATION DOSE REDUCTION: This exam was performed according to the departmental dose-optimization program which includes automated exposure control, adjustment of the mA and/or kV according to patient size and/or use of iterative reconstruction technique. COMPARISON:  Head CT today.  Neck CT 12/06/2020. FINDINGS: Alignment: Chronic straightening of cervical lordosis is stable since 2022. Cervicothoracic junction alignment is within normal limits. Bilateral posterior element alignment is within normal limits. Skull base and vertebrae: Visualized skull base is intact. No atlanto-occipital dissociation. C1 and C2 appear stable and intact, aligned. No acute osseous abnormality identified. Soft tissues and spinal canal: No prevertebral fluid or swelling. No visible canal hematoma. Partially retropharyngeal course of both carotids more so the left. Mild larynx motion artifact. Surgically absent thyroid. Otherwise negative visible noncontrast neck soft tissues. Chronic carious posterior mandible dentition. Disc levels: Chronic disc bulging. But mild for age degeneration  overall. No cervical spinal stenosis suspected. Upper chest: Partially visible left chest pacemaker. Stable lung apices with mild scarring. Visible upper thoracic levels appear intact. IMPRESSION: No acute traumatic injury identified in the cervical spine. Electronically Signed   By: Genevie Ann M.D.   On: 09/19/2022 06:27   CT HEAD WO CONTRAST (5MM)  Result Date: 09/19/2022 CLINICAL DATA:  57 year old female status post witness seizure. Postictal. Headache. EXAM: CT HEAD WITHOUT CONTRAST TECHNIQUE: Contiguous axial images were obtained from the base of the skull through the vertex without intravenous contrast. RADIATION DOSE REDUCTION: This exam was performed according to the departmental dose-optimization program which includes automated exposure control, adjustment of the mA and/or kV according to patient size and/or use of iterative reconstruction technique. COMPARISON:  Brain MRI 06/22/2018.  Head CT 07/30/2022. FINDINGS: Brain: Cerebral volume is stable and within normal limits. No midline shift, ventriculomegaly, mass effect, evidence of mass lesion, intracranial hemorrhage or evidence of cortically based acute infarction. Gray-white matter differentiation is within normal limits throughout the brain. No encephalomalacia identified. Vascular: No suspicious intracranial vascular hyperdensity. Skull: No acute osseous abnormality identified. Hyperostosis of the calvarium, normal variant. Sinuses/Orbits: Left sphenoid and ethmoid sinus aeration has improved since November. Residual bubbly opacity in the right frontal sinus. Incidental left frontoethmoidal sinus osteoma (chronic). Tympanic cavities and mastoids appear clear. Other: Mild left lateral scalp hematoma or contusion series 3, image 58. No scalp soft tissue gas. No orbits soft tissue injury identified. IMPRESSION: 1. Mild left lateral scalp hematoma or contusion. No underlying skull fracture. 2. Stable and normal for age noncontrast CT appearance of the  brain. 3. Chronic paranasal sinus disease, mildly improved since November. Electronically Signed   By: Genevie Ann M.D.   On: 09/19/2022 06:23   CT CHEST WO CONTRAST  Result Date: 09/11/2022 CLINICAL DATA:  Cough, shortness of breath, recent left knee surgery EXAM: CT  CHEST WITHOUT CONTRAST TECHNIQUE: Multidetector CT imaging of the chest was performed following the standard protocol without IV contrast. RADIATION DOSE REDUCTION: This exam was performed according to the departmental dose-optimization program which includes automated exposure control, adjustment of the mA and/or kV according to patient size and/or use of iterative reconstruction technique. COMPARISON:  09/11/2022, 12/16/2022 FINDINGS: Cardiovascular: Unenhanced imaging of the heart is unremarkable without pericardial effusion. Dual lead pacemaker/AICD unchanged. Normal caliber of the thoracic aorta. Evaluation of the vascular lumen is limited without IV contrast. Mediastinum/Nodes: No enlarged mediastinal or axillary lymph nodes. Thyroid gland, trachea, and esophagus demonstrate no significant findings. Lungs/Pleura: No acute airspace disease, effusion, or pneumothorax. Central airways are widely patent. Upper Abdomen: No acute abnormality. Superior extent of an IVC filter incidentally noted. Musculoskeletal: No acute or destructive bony lesions. Reconstructed images demonstrate no additional findings. IMPRESSION: 1. No acute intrathoracic process. Electronically Signed   By: Randa Ngo M.D.   On: 09/11/2022 15:20   US Venous Img Upper Bilat (DVT)  Result Date: 09/11/2022 CLINICAL DATA:  Upper extremity swelling. EXAM: BILATERAL UPPER EXTREMITY VENOUS DOPPLER ULTRASOUND TECHNIQUE: Gray-scale sonography with graded compression, as well as color Doppler and duplex ultrasound were performed to evaluate the bilateral upper extremity deep venous systems from the level of the subclavian vein and including the jugular, axillary, basilic, radial, ulnar  and upper cephalic vein. Spectral Doppler was utilized to evaluate flow at rest and with distal augmentation maneuvers. COMPARISON:  CT chest, concurrent. FINDINGS: RIGHT UPPER EXTREMITY VENOUS Normal compressibility of the RIGHT internal jugular, subclavian, axillary, cephalic, basilic, brachial, radial and ulnar veins. No filling defects to suggest DVT on grayscale or color Doppler imaging. Doppler waveforms show normal direction of venous flow, normal respiratory plasticity and response to augmentation. OTHER No evidence of superficial thrombophlebitis or abnormal fluid collection. Limitations: none LEFT UPPER EXTREMITY VENOUS Normal compressibility of the LEFT internal jugular, subclavian, axillary, cephalic, basilic, brachial, radial and ulnar veins. No filling defects to suggest DVT on grayscale or color Doppler imaging. Doppler waveforms show normal direction of venous flow, normal respiratory plasticity and response to augmentation. OTHER No evidence of superficial thrombophlebitis or abnormal fluid collection. Limitations: none IMPRESSION: No evidence of DVT or superficial thrombophlebitis within either upper extremity. Michaelle Birks, MD [facility] Electronically Signed   By: Michaelle Birks M.D.   On: 09/11/2022 15:17   US Venous Img Lower Bilateral (DVT)  Result Date: 09/11/2022 CLINICAL DATA:  Left lower extremity pain and swelling status post knee replacement. EXAM: BILATERAL LOWER EXTREMITY VENOUS DOPPLER ULTRASOUND TECHNIQUE: Gray-scale sonography with graded compression, as well as color Doppler and duplex ultrasound were performed to evaluate the lower extremity deep venous systems from the level of the common femoral vein and including the common femoral, femoral, profunda femoral, popliteal and calf veins including the posterior tibial, peroneal and gastrocnemius veins when visible. The superficial great saphenous vein was also  interrogated. Spectral Doppler was utilized to evaluate flow at rest and with distal augmentation maneuvers in the common femoral, femoral and popliteal veins. COMPARISON:  Lower extremity venous Doppler ultrasound 06/24/2018 and 09/24/2017 FINDINGS: RIGHT LOWER EXTREMITY Common Femoral Vein: No evidence of thrombus. Normal compressibility, respiratory phasicity and response to augmentation. Saphenofemoral Junction: No evidence of thrombus. Normal compressibility and flow on color Doppler imaging. Profunda Femoral Vein: No evidence of thrombus. Normal compressibility and flow on color Doppler imaging. Femoral Vein: No evidence of thrombus. Normal compressibility, respiratory phasicity and response to augmentation. Popliteal Vein:  No evidence of thrombus. Normal compressibility, respiratory phasicity and response to augmentation. Calf Veins: No evidence of thrombus. Normal compressibility and flow on color Doppler imaging. Superficial Great Saphenous Vein: No evidence of thrombus. Normal compressibility. Venous Reflux:  None. Other Findings:  None. LEFT LOWER EXTREMITY Common Femoral Vein: No evidence of thrombus. Normal compressibility, respiratory phasicity and response to augmentation. Saphenofemoral Junction: No evidence of thrombus. Normal compressibility and flow on color Doppler imaging. Profunda Femoral Vein: No evidence of thrombus. Normal compressibility and flow on color Doppler imaging. Femoral Vein: No evidence of thrombus. Normal compressibility, respiratory phasicity and response to augmentation. Popliteal Vein: No evidence of thrombus. Normal compressibility, respiratory phasicity and response to augmentation. Calf Veins: No evidence of thrombus. Normal compressibility and flow on color Doppler imaging. Superficial Great Saphenous Vein: No evidence of thrombus. Normal compressibility. Venous Reflux:  None. Other Findings:  None. IMPRESSION: No evidence of deep venous thrombosis in either lower  extremity. Electronically Signed   By: Logan Bores M.D.   On: 09/11/2022 13:45   DG Chest 2 View  Result Date: 09/11/2022 CLINICAL DATA:  Chest pain EXAM: CHEST - 2 VIEW COMPARISON:  May 16, 2022 FINDINGS: Stable AICD device. The heart, hila, mediastinum, lungs, and pleura are otherwise unremarkable. An IVC filter is identified in the upper abdomen. IMPRESSION: No active cardiopulmonary disease. Electronically Signed   By: Dorise Bullion III M.D.   On: 09/11/2022 10:38     Assessment/Plan 1. History of DVT (deep vein thrombosis) The patient will continue anticoagulation for now as there have not been any problems or complications at this point.  At the present time the patient is taking Eliquis 2.5 mg twice daily.  IVC filter is in place, this was in preparation for her left total knee replacement performed 08/24/2022.  Filter was indicated given the history of PE / DVT.  However, there is now the possibility of the right knee being replaced and therefore we will not yet remove the filter.  Elevation was stressed, such as the use of a recliner.  I have discussed  DVT and post phlebitic changes such as swelling and why it  causes symptoms such as pain.  The patient should wear graduated compression stockings.  The compression should be worn on a daily basis. The patient should wear the stockings first thing in the morning and removing them in the evening. The patient should not to sleep in the stockings.  In addition, behavioral modification including elevation during the day and avoidance of prolonged dependency will be initiated.    The patient will follow-up with me in early June after the joint replacement surgery to discuss removal (this was also discussed today and the patient agrees with the plan to have the filter removed).   2. Status post total knee replacement, unspecified laterality She is status post successful May surgery again on the 08/24/2022.  I await the final decision  regarding further joint replacement surgery and then will plan to remove the filter as an outpatient.  3. Type 2 diabetes mellitus without complication, unspecified whether long term insulin use ([place]) Continue hypoglycemic medications as already ordered, these medications have been reviewed and there are no changes at this time.  Hgb A1C to be monitored as already arranged by primary service  4. Coronary artery disease involving native coronary artery of native heart with angina pectoris ([place]) Continue cardiac and antihypertensive medications as already ordered and reviewed, no changes at this time.  Continue statin as ordered and  reviewed, no changes at this time  Nitrates PRN for chest pain  5. Essential hypertension Continue antihypertensive medications as already ordered, these medications have been reviewed and there are no changes at this time.    Hortencia Pilar, MD  09/25/2022 3:01 PM

## 2022-09-26 ENCOUNTER — Encounter (INDEPENDENT_AMBULATORY_CARE_PROVIDER_SITE_OTHER): Payer: Self-pay | Admitting: Vascular Surgery

## 2022-09-26 ENCOUNTER — Ambulatory Visit (INDEPENDENT_AMBULATORY_CARE_PROVIDER_SITE_OTHER): Payer: 59 | Admitting: Vascular Surgery

## 2022-09-26 VITALS — BP 106/74 | HR 85 | Resp 16 | Wt 200.0 lb

## 2022-09-26 DIAGNOSIS — Z86718 Personal history of other venous thrombosis and embolism: Secondary | ICD-10-CM

## 2022-09-26 DIAGNOSIS — I25119 Atherosclerotic heart disease of native coronary artery with unspecified angina pectoris: Secondary | ICD-10-CM

## 2022-09-26 DIAGNOSIS — I10 Essential (primary) hypertension: Secondary | ICD-10-CM

## 2022-09-26 DIAGNOSIS — E119 Type 2 diabetes mellitus without complications: Secondary | ICD-10-CM

## 2022-09-26 DIAGNOSIS — Z96659 Presence of unspecified artificial knee joint: Secondary | ICD-10-CM | POA: Diagnosis not present

## 2022-10-01 ENCOUNTER — Encounter (INDEPENDENT_AMBULATORY_CARE_PROVIDER_SITE_OTHER): Payer: Self-pay | Admitting: Vascular Surgery

## 2022-10-11 ENCOUNTER — Encounter: Payer: Self-pay | Admitting: Oncology

## 2022-10-11 ENCOUNTER — Encounter: Payer: Self-pay | Admitting: Emergency Medicine

## 2022-10-11 ENCOUNTER — Emergency Department: Payer: 59

## 2022-10-11 ENCOUNTER — Emergency Department
Admission: EM | Admit: 2022-10-11 | Discharge: 2022-10-11 | Disposition: A | Discharge status: Home / Self Care | Payer: 59 | Attending: Emergency Medicine | Admitting: Emergency Medicine

## 2022-10-11 DIAGNOSIS — R059 Cough, unspecified: Secondary | ICD-10-CM | POA: Diagnosis present

## 2022-10-11 DIAGNOSIS — I5020 Unspecified systolic (congestive) heart failure: Secondary | ICD-10-CM | POA: Diagnosis not present

## 2022-10-11 DIAGNOSIS — J069 Acute upper respiratory infection, unspecified: Secondary | ICD-10-CM | POA: Insufficient documentation

## 2022-10-11 DIAGNOSIS — Z85028 Personal history of other malignant neoplasm of stomach: Secondary | ICD-10-CM | POA: Diagnosis not present

## 2022-10-11 DIAGNOSIS — Z20822 Contact with and (suspected) exposure to covid-19: Secondary | ICD-10-CM | POA: Diagnosis not present

## 2022-10-11 LAB — RESP PANEL BY RT-PCR (RSV, FLU A&B, COVID)  RVPGX2
Influenza A by PCR: NEGATIVE
Influenza B by PCR: NEGATIVE
Resp Syncytial Virus by PCR: NEGATIVE
SARS Coronavirus 2 by RT PCR: NEGATIVE

## 2022-10-11 NOTE — ED Provider Notes (Signed)
[facility] Provider Note    None    (approximate)   History   Cough   HPI  Brittany Nguyen is a 57 y.o. female   with a rather complicated medical history including heart failure with reduced EF, kidney stones, electrolyte abnormalities AICD, C. difficile prior DVT, stomach cancer, IVC filter in place  Patient here she has been having a cough with low-grade fever for about 2 days.  She reports that her sister whom she has been around recently died of COVID.  She has been experiencing a little bit of a runny nose cough and mild nasal congestion.  No chest pain no trouble breathing no wheezing.  She is in her otherwise normal state of health she has chronic abdominal pain which she takes medication for no change or worsening  She reports that since she has been around her sister who recently died of COVID she was concerned that she needs to make certain she has not developed   She has been taking her Lasix.  She has not experienced any weight gain or she has not seen leg swelling.  She has been taking Delson for cough.  Reports she is taken Tessalon Perles in the past but they are not helpful   Physical Exam   Triage Vital Signs: ED Triage Vitals  Enc Vitals Group     BP 10/11/22 0150 109/78     Pulse Rate 10/11/22 0150 87     Resp 10/11/22 0150 18     Temp 10/11/22 0150 97.8 F (36.6 C)     Temp Source 10/11/22 0150 Oral     SpO2 10/11/22 0150 99 %     Weight 10/11/22 0145 189 lb (85.7 kg)     Height 10/11/22 0145 '5\' 5"'$  (1.651 m)     Head Circumference --      Peak Flow --      Pain Score 10/11/22 0145 5     Pain Loc --      Pain Edu? --      Excl. in [place]? --     Most recent vital signs: Vitals:   10/11/22 0150 10/11/22 0152  BP: 109/78   Pulse: 87   Resp: 18   Temp: 97.8 F (36.6 C) 97.9 F (36.6 C)  SpO2: 99%      General: Awake, no distress.  Very pleasant.  Occasional dry cough.  No wheezing no shortness of breath speaks in  full clear sentences.  Fully alert conversant. Very mild clear coryza CV:  Good peripheral perfusion.  Normal tones and rate Resp:  Normal effort.  Clear lung fields.  When she takes a deep breath it does induce some dry coughing.  No wheezing to noted no rales. Abd:  No distention.  Other:  No lower extremity edema venous cords or congestion   ED Results / Procedures / Treatments   Labs (all labs ordered are listed, but only abnormal results are displayed) Labs Reviewed  RESP PANEL BY RT-PCR (RSV, FLU A&B, COVID)  [id number]     RADIOLOGY Chest x-ray interpreted by me as negative for acute finding.  ICD in place    PROCEDURES:  Critical Care performed: No  Procedures   MEDICATIONS ORDERED IN ED: Medications - No data to display   IMPRESSION / MDM / [place] / ED COURSE  I reviewed the triage vital signs and the nursing notes.  Differential diagnosis includes, but is not limited to, probable upper respiratory syndrome, cough, exclude causation such as influenza, COVID, or pneumonia.  No chest pain no hypoxia no dyspnea.  She endorses frequent cough.  She is currently taking Delsym, discussed with her options for cough medication she reports Tessalon does not help her and there is contraindication to prescribing Hycodan as she is currently also taking oxycodone for stomach pain which is chronic in nature  Her examination and symptoms are very reassuring she has coryza and cough at this point.  No other concerning findings no dyspnea.  She has a history of DVT but has an IVC filter in place has no chest pain no pleuritic pain or other cardiac symptoms at this time.  COVID and influenza testing negative, appears to likely be experiencing symptoms of a upper respiratory viral illness or "cold"  Patient's presentation is most consistent with acute complicated illness / injury requiring diagnostic workup.   Return precautions and treatment  recommendations and follow-up discussed with the patient who is agreeable with the plan.        FINAL CLINICAL IMPRESSION(S) / ED DIAGNOSES   Final diagnoses:  Viral URI with cough     Rx / DC Orders   ED Discharge Orders     None        Note:  This document was prepared using Dragon voice recognition software and may include unintentional dictation errors.   Delman Kitten, MD 10/11/22 [phone]

## 2022-10-11 NOTE — ED Triage Notes (Signed)
Pt presents via POV with complaints of cough and nasal congestion for the last several days. Pt was exposed to COVID from her sister last week. No meds taken PTA. Airway patent - respirations equal and unlabored. Denies CP or SOB.

## 2022-10-13 ENCOUNTER — Ambulatory Visit
Admission: RE | Admit: 2022-10-13 | Discharge: 2022-10-13 | Disposition: A | Discharge status: Home / Self Care | Payer: 59 | Source: Ambulatory Visit | Attending: Orthopedic Surgery | Admitting: Orthopedic Surgery

## 2022-10-13 ENCOUNTER — Other Ambulatory Visit: Payer: Self-pay | Admitting: Orthopedic Surgery

## 2022-10-13 ENCOUNTER — Encounter: Payer: Self-pay | Admitting: Oncology

## 2022-10-13 DIAGNOSIS — M79605 Pain in left leg: Secondary | ICD-10-CM | POA: Insufficient documentation

## 2022-10-18 DIAGNOSIS — R457 State of emotional shock and stress, unspecified: Secondary | ICD-10-CM | POA: Insufficient documentation

## 2022-11-07 ENCOUNTER — Encounter (HOSPITAL_COMMUNITY): Payer: 59

## 2022-11-07 ENCOUNTER — Inpatient Hospital Stay: Admission: RE | Admit: 2022-11-07 | Payer: 59 | Source: Ambulatory Visit | Admitting: Neurology

## 2022-11-11 DIAGNOSIS — Z96652 Presence of left artificial knee joint: Secondary | ICD-10-CM | POA: Diagnosis not present

## 2022-11-11 DIAGNOSIS — R252 Cramp and spasm: Secondary | ICD-10-CM | POA: Diagnosis not present

## 2022-11-26 DIAGNOSIS — I4891 Unspecified atrial fibrillation: Secondary | ICD-10-CM | POA: Insufficient documentation

## 2022-11-29 ENCOUNTER — Emergency Department
Admission: EM | Admit: 2022-11-29 | Discharge: 2022-11-29 | Disposition: A | Discharge status: Home / Self Care | Payer: 59 | Attending: Emergency Medicine | Admitting: Emergency Medicine

## 2022-11-29 ENCOUNTER — Other Ambulatory Visit: Payer: Self-pay

## 2022-11-29 ENCOUNTER — Encounter: Payer: Self-pay | Admitting: Emergency Medicine

## 2022-11-29 DIAGNOSIS — J449 Chronic obstructive pulmonary disease, unspecified: Secondary | ICD-10-CM | POA: Insufficient documentation

## 2022-11-29 DIAGNOSIS — E119 Type 2 diabetes mellitus without complications: Secondary | ICD-10-CM | POA: Insufficient documentation

## 2022-11-29 DIAGNOSIS — I2510 Atherosclerotic heart disease of native coronary artery without angina pectoris: Secondary | ICD-10-CM | POA: Diagnosis not present

## 2022-11-29 DIAGNOSIS — M62838 Other muscle spasm: Secondary | ICD-10-CM | POA: Diagnosis not present

## 2022-11-29 DIAGNOSIS — I110 Hypertensive heart disease with heart failure: Secondary | ICD-10-CM | POA: Insufficient documentation

## 2022-11-29 DIAGNOSIS — J45909 Unspecified asthma, uncomplicated: Secondary | ICD-10-CM | POA: Insufficient documentation

## 2022-11-29 DIAGNOSIS — I5042 Chronic combined systolic (congestive) and diastolic (congestive) heart failure: Secondary | ICD-10-CM | POA: Insufficient documentation

## 2022-11-29 DIAGNOSIS — E039 Hypothyroidism, unspecified: Secondary | ICD-10-CM | POA: Insufficient documentation

## 2022-11-29 DIAGNOSIS — M25562 Pain in left knee: Secondary | ICD-10-CM | POA: Diagnosis not present

## 2022-11-29 DIAGNOSIS — Z91040 Latex allergy status: Secondary | ICD-10-CM | POA: Insufficient documentation

## 2022-11-29 DIAGNOSIS — Z853 Personal history of malignant neoplasm of breast: Secondary | ICD-10-CM | POA: Diagnosis not present

## 2022-11-29 MED ORDER — DIAZEPAM 5 MG PO TABS
5.0000 mg | ORAL_TABLET | Freq: Once | ORAL | Status: AC
  Administered 2022-11-29: 5 mg via ORAL
  Filled 2022-11-29: qty 1

## 2022-11-29 MED ORDER — DIAZEPAM 2 MG PO TABS: 2.0000 mg | ORAL_TABLET | Freq: Three times a day (TID) | ORAL | 0 refills | Status: DC | PRN

## 2022-11-29 MED ORDER — KETOROLAC TROMETHAMINE 60 MG/2ML IM SOLN
30.0000 mg | Freq: Once | INTRAMUSCULAR | Status: AC
  Administered 2022-11-29: 30 mg via INTRAMUSCULAR
  Filled 2022-11-29: qty 2

## 2022-11-29 MED ORDER — OXYCODONE-ACETAMINOPHEN 5-325 MG PO TABS
1.0000 | ORAL_TABLET | Freq: Once | ORAL | Status: AC
  Administered 2022-11-29: 1 via ORAL
  Filled 2022-11-29: qty 1

## 2022-11-29 NOTE — ED Notes (Signed)
Ace wrap applied to pts left knee area per order.

## 2022-11-29 NOTE — ED Provider Notes (Signed)
[facility] Provider Note    Event Date/Time   First MD Initiated Contact with Patient 11/29/22 0410     (approximate)   History   Knee Pain   HPI  Brittany Nguyen is a 57 y.o. female who presents to the ED from home with a chief complaint of acute on chronic left knee pain.  Patient with a history of left TKR who has been experiencing cramping pain to her left knee since January.  Recently seen by neurology who increased her gabapentin and are planning to schedule her for EMG as well as MRI.  Denies recent fall/injury/trauma.  Voices no other concerns or injuries.     Past Medical History   Past Medical History:  Diagnosis Date   Acute anterolateral wall MI ([place]) 08/2016   AICD (automatic cardioverter/defibrillator) present 03/29/2017   a.) Medtronic Evera MRI XT DR SureScan (SN: [id number])   Anxiety    Arthritis    Asthma 2013   C. 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[place])    a.) on lacosamide; b.) last seizure 07-30-22 in setting of hypokalemia (K+ 2.6)   GERD (gastroesophageal reflux disease)    GIB (gastrointestinal bleeding) 01/08/2018   HFrEF (heart failure with reduced ejection fraction) ([place])    a.)TTE 12/09/16: EF 25-30%, dif HK; b.) TTE 02/08/17: EF 25%, dif HK, mild TR, G1DD; c.) TTE 04/25/17: EF 30-35%, mild LVH; d.) TTE 01/30/18: EF 45%, MAC, LAE, mild MR, G1DD; e.) TTE 11/06/2018: EF 25%, sev glob HK, triv MR/TR/PR; f.) TTE  05/12/19: EF 35%, MAC, AoV sclerosis, G1DD; g.) TTE 11/05/19: EF 20%, sev glob HK, triv MR; h.) TTE 06/17/20: EF 40%, mod glob HK, triv MR; I:) TTE 06/14/22: EF 25-30%, G1DD   History of cardiac catheterization    a.) LHC 10/21/2014: normal coronaries; b.) R/LHC 02/24/2017: normal coronaries, mRA 12, mPA 28, mPCWP 15, CO 8.0, CI 3.8   History of kidney stones    Hx MRSA infection    Hypokalemia    Hypothyroidism    Iron deficiency anemia 03/27/2018   Lump or mass in breast 11/27/2012   a.) Bx 11/27/2012 --> apocrine wall cyst   Migraine    Nausea & vomiting    Neuropathy    Non-ischemic cardiomyopathy ([place])    a.) TTE 12/09/2016: EF 25-30%, b.) TTE 02/08/2017: EF 25%; c.) Medtronic AICD placed 03/29/2017; d.) TTE 04/25/2017: EF 30-35%; e.) TTE 01/30/2018: EF 45%; f.) TTE 11/06/2018: EF 25%; g.) TTE 05/12/2019: EF 35%; h.) TTE 11/05/2019: EF 20%; I.) TTE 06/17/2020: EF 40%; J.) TTE 06/14/2022: EF 25-30%   Obesity    OSA on CPAP    PAF (paroxysmal atrial fibrillation) (HCC)    PONV (postoperative nausea and vomiting)    Post-COVID chronic cough    Postcholecystectomy diarrhea    Prolonged Q-T interval on ECG    Restless leg    a.) on ropinirole   Unstable angina ([place])  Active Problem List   Patient Active Problem List   Diagnosis Date Noted   HTN (hypertension) 09/19/2022   CAD (coronary artery disease) 09/19/2022   Depression with anxiety 09/19/2022   Nonproductive cough 09/11/2022   Hypothyroidism 09/11/2022   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   CHF (congestive heart failure) ([place]) 08/17/2022   History of DVT (deep vein thrombosis) 08/17/2022   MRSA (methicillin resistant Staphylococcus aureus) 08/17/2022   Hypocalcemia 07/31/2022   Diarrhea 07/31/2022   Essential hypertension 07/31/2022   Morbid obesity with body mass index (BMI) of 50.0 to 59.9 in adult ([place]) 07/31/2022   Seizures ([place]) 07/30/2022   Breast cancer ([place]) 05/23/2022   Bloody stool 04/22/2022    Seizure ([place]) 03/01/2022   Refractory nausea and vomiting 12/15/2021   Migraine 11/09/2021   Neuropathy 11/09/2021   Anxiety 11/09/2021   At risk for prolonged QT interval syndrome - borderline long QT on EKG 11/07/21 in ED 11/07/2021   Prolonged QT interval 09/27/2021   Cancer related pain 08/10/2021   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   Seizure disorder ([place]) 06/22/2021   Chronic diarrhea 06/22/2021   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   Moderate protein-calorie malnutrition ([place]) 06/19/2020   Non-ischemic cardiomyopathy ([place]) 01/23/2020   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/23/2020   Breakthrough seizure ([place]) 01/23/2020   Acquired hypothyroidism 01/23/2020   Chronic use of opiate for therapeutic purpose 01/23/2020   Type 2 diabetes mellitus without complication ([place]) [id number]   Current moderate episode of major depressive disorder ([place]) 05/21/2019   Viral syndrome 04/24/2019   DVT (deep venous thrombosis) ([place]) 02/27/2019   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   Coronary artery disease involving native coronary artery of native heart 08/24/2018   Mild intermittent asthma without complication [id number]   Breast mass 08/08/2018   Intractable migraine with aura without status migrainosus 06/20/2018   Osteoarthritis of knee 06/01/2018   Pain in right knee 05/15/2018   Abdominal wall pain in right flank 04/10/2018   Postcholecystectomy diarrhea 04/10/2018   Iron deficiency anemia 03/27/2018   Unstable angina (HCC)    Encounter for anticoagulation discussion and counseling    COPD (chronic obstructive pulmonary disease) ([place]) 04/26/2017   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 02/03/2017   Hypotension 02/03/2017   RLS (restless legs syndrome) 12/09/2016   Hypokalemia 11/22/2015   OSA on CPAP 04/17/2014   Hx MRSA infection     Lump or mass in breast    Asthma 2013   GOITER, MULTINODULAR 01/20/2009   HYPOGLYCEMIA, UNSPECIFIED 01/20/2009   GERD 01/20/2009   DIVERTICULITIS OF COLON 01/20/2009   Diverticulitis of colon 01/20/2009     Past Surgical History   Past Surgical History:  Procedure Laterality Date   ABDOMINAL HYSTERECTOMY     age 71   BREAST BIOPSY Right 2014   benign   CARDIAC DEFIBRILLATOR PLACEMENT Left 03/29/2017   Procedure: [place] (AICD); Location: [facility]; Surgeon: Burnett Sheng, MD   CESAREAN SECTION     CHOLECYSTECTOMY     COLECTOMY     COLONOSCOPY WITH PROPOFOL N/A 02/22/2018   Procedure: COLONOSCOPY WITH PROPOFOL;  Surgeon: Toledo, Benay Pike, MD;  Location: [facility];  Service: Gastroenterology;  Laterality: N/A;   ESOPHAGOGASTRODUODENOSCOPY (EGD) WITH PROPOFOL N/A 02/22/2018   Procedure: ESOPHAGOGASTRODUODENOSCOPY (EGD) WITH PROPOFOL;  Surgeon: Toledo, Benay Pike, MD;  Location: [facility];  Service: Gastroenterology;  Laterality: N/A;  IVC FILTER INSERTION N/A 08/23/2022   Procedure: IVC FILTER INSERTION;  Surgeon: Katha Cabal, MD;  Location: [facility];  Service: Cardiovascular;  Laterality: N/A;   KNEE ARTHROPLASTY Left 08/24/2022   Procedure: COMPUTER ASSISTED TOTAL KNEE ARTHROPLASTY;  Surgeon: Dereck Leep, MD;  Location: [facility];  Service: Orthopedics;  Laterality: Left;   LEFT HEART CATH AND CORONARY ANGIOGRAPHY Left 10/21/2014   Procedure: LEFT HEART CATH AND CORONARY ANGIOGRAPHY; Location: [facility]; Surgeon: Serafina Royals, MD   NASAL SINUS SURGERY  2012   OOPHORECTOMY     RIGHT/LEFT HEART CATH AND CORONARY ANGIOGRAPHY Bilateral 02/24/2017   Procedure: RIGHT/LEFT HEART CATH AND CORONARY ANGIOGRAPHY; Location: [facility]   THYROIDECTOMY       Home Medications   Prior to Admission medications   Medication Sig Start Date End Date Taking? Authorizing Provider  diazepam (VALIUM) 2 MG tablet Take 1 tablet (2 mg total) by  mouth every 8 (eight) hours as needed for muscle spasms. 11/29/22  Yes Paulette Blanch, MD  acetaminophen (TYLENOL) 500 MG tablet Take 2 tablets (1,000 mg total) by mouth every 6 (six) hours as needed. 09/11/22 09/11/23  Teodoro Spray, PA  albuterol (ACCUNEB) 1.25 MG/3ML nebulizer solution Take 1 ampule by nebulization every 6 (six) hours as needed for wheezing. Patient not taking: Reported on 08/23/2022    [provider]  albuterol (VENTOLIN HFA) 108 (90 Base) MCG/ACT inhaler Inhale 1-2 puffs into the lungs every 6 (six) hours as needed for wheezing or shortness of breath. Patient not taking: Reported on 08/23/2022    [provider]  calcium-vitamin D (OSCAL WITH D) 500-5 MG-MCG tablet Take 1 tablet by mouth daily with breakfast. 09/20/22   Emeterio Reeve, DO  cyclobenzaprine (FLEXERIL) 5 MG tablet Take 5 mg by mouth 4 (four) times daily as needed. 08/31/22   [provider]  fentaNYL (DURAGESIC) 75 MCG/HR Place 1 patch onto the skin every 3 (three) days. 01/12/22   [provider]  furosemide (LASIX) 40 MG tablet Take 40 mg by mouth 2 (two) times daily. 02/17/22   [provider]  gabapentin (NEURONTIN) 300 MG capsule Take 1 capsule (300 mg total) by mouth at bedtime. 09/20/22   Emeterio Reeve, DO  Lacosamide 100 MG TABS Take 1 tablet by mouth 2 (two) times daily.    [provider]  levothyroxine (SYNTHROID) 175 MCG tablet Take 175 mcg by mouth every morning. 12/17/21   [provider]  LORazepam (ATIVAN) 1 MG tablet Take 1 tablet (1 mg total) by mouth 4 (four) times daily as needed. 11/10/21   Lorella Nimrod, MD  magnesium oxide (MAG-OX) 400 MG tablet Take 1 tablet (400 mg total) by mouth daily. 09/20/22   Emeterio Reeve, DO  Melatonin 3 MG CAPS Take 1-2 capsules (3-6 mg total) by mouth at bedtime. 09/20/22   Emeterio Reeve, DO  metoprolol succinate (TOPROL-XL) 50 MG 24 hr tablet Take 50 mg by mouth 2 (two) times daily. 02/16/22    [provider]  morphine (MSIR) 15 MG tablet Take 15 mg by mouth every 4 (four) hours as needed. 02/17/22   [provider]  naloxone [facility]) nasal spray 4 mg/0.1 mL Place 1 spray into the nose as needed for opioid reversal. May repeat once in opposite nare. 10/05/21   [provider]  nitroGLYCERIN (NITROSTAT) 0.4 MG SL tablet Place 0.4 mg under the tongue every 5 (five) minutes x 3 doses as needed for chest pain. 11/23/21   [provider]  oxyCODONE-acetaminophen (PERCOCET/ROXICET) 5-325 MG tablet Take 1-2 tablets by mouth every 4 (four) hours as needed (breakthrough pain). 01/17/20   [provider]  Potassium Chloride ER 20 MEQ TBCR Take 1 tablet by mouth in the morning and at bedtime. 09/20/22   Emeterio Reeve, DO  promethazine (PHENERGAN) 25 MG tablet Take 25 mg by mouth every 4 (four) hours as needed for nausea/vomiting. 12/13/21   [provider]  rOPINIRole (REQUIP) 4 MG tablet Take 4 mg by mouth at bedtime. 01/30/19   [provider]     Allergies  Contrast media [iodinated contrast media], Ferumoxytol, Iron sucrose, Lidocaine, Metrizamide, Penicillins, Sacubitril-valsartan, Isosorbide nitrate, Latex, Ondansetron, Tizanidine, Povidone-iodine, and Pulmicort [budesonide]   Family History   Family History  Problem Relation Age of Onset   Cancer Mother 36       ovarian   CAD Mother    Cancer Father 56       brain   CAD Father    Cancer Daughter 69       skin   Cancer Maternal Aunt 46       breast   Leukemia Paternal Grandfather      Physical Exam  Triage Vital Signs: ED Triage Vitals [11/29/22 0410]  Enc Vitals Group     BP      Pulse      Resp      Temp      Temp src      SpO2      Weight 190 lb (86.2 kg)     Height 5\' 5"  (1.651 m)     Head Circumference      Peak Flow      Pain Score 10     Pain Loc      Pain Edu?      Excl. in [place]?     Updated Vital Signs: BP 106/80 (BP Location: Left Arm)    Pulse 81   Temp 98 F (36.7 C) (Oral)   Resp 19   Ht 5\' 5"  (1.651 m)   Wt 86.2 kg   LMP 01/22/2012 (Approximate) Comment: Hysterectomy 5 years ago  SpO2 99%   BMI 31.62 kg/m    General: Awake, moderate distress.  Tearful. CV:  Good peripheral perfusion.  Resp:  Normal effort.  Abd:  No distention.  Other:  Holding left knee.  No deformities or effusions noted.  Limited range of motion secondary to pain.  2+ distal pulses.   ED Results / Procedures / Treatments  Labs (all labs ordered are listed, but only abnormal results are displayed) Labs Reviewed - No data to display   EKG  None   RADIOLOGY None   Official radiology report(s): No results found.   PROCEDURES:  Critical Care performed: No  Procedures   MEDICATIONS ORDERED IN ED: Medications  ketorolac (TORADOL) injection 30 mg (30 mg Intramuscular Given 11/29/22 0429)  diazepam (VALIUM) tablet 5 mg (5 mg Oral Given 11/29/22 0429)  oxyCODONE-acetaminophen (PERCOCET/ROXICET) 5-325 MG per tablet 1 tablet (1 tablet Oral Given 11/29/22 0429)     IMPRESSION / MDM / ASSESSMENT AND PLAN / ED COURSE  I reviewed the triage vital signs and the nursing notes.                             57 year old female presenting with acute on chronic left knee pain and spasms.  Will administer IM Ketorolac, oral Percocet and Valium.  Will reassess. Patient's presentation is most consistent with acute, uncomplicated illness.  0535 Feeling significant better, able to ambulate with her cane.  Will provide Ace wrap for comfort.  Discharge home with as needed Valium for muscle relaxation.  Strict return precautions given.  Patient and spouse verbalized understanding agree with plan of care.  FINAL CLINICAL IMPRESSION(S) / ED DIAGNOSES   Final diagnoses:  Acute pain of left knee  Muscle spasm     Rx / DC Orders   ED Discharge Orders          Ordered    diazepam (VALIUM) 2 MG tablet  Every 8 hours PRN        11/29/22 0537              Note:  This document was prepared using Dragon voice recognition software and may include unintentional dictation errors.   Paulette Blanch, MD 11/29/22 [phone]

## 2022-11-29 NOTE — Discharge Instructions (Signed)
Continue your other medications.  You may take Valium as needed for muscle spasms.  Return to the ER for worsening symptoms or other concerns.

## 2022-11-29 NOTE — ED Triage Notes (Signed)
Pt to triage via w/c; reports "cramping" pain to left knee for several days; denies any injury; st hx of left TKR yr ago and hx of restless legs; seen by Dr Melrose Nakayama few days ago and had gabapentin increased with no relief

## 2022-11-30 ENCOUNTER — Emergency Department: Payer: 59

## 2022-11-30 ENCOUNTER — Observation Stay: Payer: 59

## 2022-11-30 ENCOUNTER — Other Ambulatory Visit: Payer: Self-pay

## 2022-11-30 ENCOUNTER — Observation Stay
Admission: EM | Admit: 2022-11-30 | Discharge: 2022-12-01 | Disposition: A | Discharge status: Home / Self Care | Payer: 59 | Attending: Internal Medicine | Admitting: Internal Medicine

## 2022-11-30 DIAGNOSIS — M179 Osteoarthritis of knee, unspecified: Secondary | ICD-10-CM | POA: Diagnosis present

## 2022-11-30 DIAGNOSIS — Z79899 Other long term (current) drug therapy: Secondary | ICD-10-CM | POA: Insufficient documentation

## 2022-11-30 DIAGNOSIS — Z8616 Personal history of COVID-19: Secondary | ICD-10-CM | POA: Insufficient documentation

## 2022-11-30 DIAGNOSIS — I110 Hypertensive heart disease with heart failure: Secondary | ICD-10-CM | POA: Diagnosis not present

## 2022-11-30 DIAGNOSIS — J449 Chronic obstructive pulmonary disease, unspecified: Secondary | ICD-10-CM | POA: Diagnosis not present

## 2022-11-30 DIAGNOSIS — R9431 Abnormal electrocardiogram [ECG] [EKG]: Secondary | ICD-10-CM | POA: Diagnosis present

## 2022-11-30 DIAGNOSIS — I2510 Atherosclerotic heart disease of native coronary artery without angina pectoris: Secondary | ICD-10-CM | POA: Diagnosis present

## 2022-11-30 DIAGNOSIS — Z91040 Latex allergy status: Secondary | ICD-10-CM | POA: Diagnosis not present

## 2022-11-30 DIAGNOSIS — G9340 Encephalopathy, unspecified: Principal | ICD-10-CM | POA: Diagnosis present

## 2022-11-30 DIAGNOSIS — Z96652 Presence of left artificial knee joint: Secondary | ICD-10-CM | POA: Insufficient documentation

## 2022-11-30 DIAGNOSIS — E119 Type 2 diabetes mellitus without complications: Secondary | ICD-10-CM

## 2022-11-30 DIAGNOSIS — Z95810 Presence of automatic (implantable) cardiac defibrillator: Secondary | ICD-10-CM | POA: Insufficient documentation

## 2022-11-30 DIAGNOSIS — I10 Essential (primary) hypertension: Secondary | ICD-10-CM | POA: Diagnosis present

## 2022-11-30 DIAGNOSIS — I959 Hypotension, unspecified: Secondary | ICD-10-CM | POA: Diagnosis not present

## 2022-11-30 DIAGNOSIS — M1711 Unilateral primary osteoarthritis, right knee: Secondary | ICD-10-CM | POA: Diagnosis present

## 2022-11-30 DIAGNOSIS — E039 Hypothyroidism, unspecified: Secondary | ICD-10-CM | POA: Diagnosis present

## 2022-11-30 DIAGNOSIS — J45909 Unspecified asthma, uncomplicated: Secondary | ICD-10-CM | POA: Insufficient documentation

## 2022-11-30 DIAGNOSIS — I5042 Chronic combined systolic (congestive) and diastolic (congestive) heart failure: Secondary | ICD-10-CM | POA: Diagnosis present

## 2022-11-30 DIAGNOSIS — Z8679 Personal history of other diseases of the circulatory system: Secondary | ICD-10-CM | POA: Diagnosis present

## 2022-11-30 DIAGNOSIS — I480 Paroxysmal atrial fibrillation: Secondary | ICD-10-CM | POA: Insufficient documentation

## 2022-11-30 DIAGNOSIS — Z86718 Personal history of other venous thrombosis and embolism: Secondary | ICD-10-CM | POA: Insufficient documentation

## 2022-11-30 DIAGNOSIS — Z9049 Acquired absence of other specified parts of digestive tract: Secondary | ICD-10-CM | POA: Insufficient documentation

## 2022-11-30 DIAGNOSIS — I5022 Chronic systolic (congestive) heart failure: Secondary | ICD-10-CM | POA: Diagnosis present

## 2022-11-30 DIAGNOSIS — R5383 Other fatigue: Secondary | ICD-10-CM | POA: Insufficient documentation

## 2022-11-30 DIAGNOSIS — R569 Unspecified convulsions: Secondary | ICD-10-CM

## 2022-11-30 DIAGNOSIS — Z85020 Personal history of malignant carcinoid tumor of stomach: Secondary | ICD-10-CM | POA: Diagnosis not present

## 2022-11-30 DIAGNOSIS — R252 Cramp and spasm: Secondary | ICD-10-CM | POA: Diagnosis not present

## 2022-11-30 DIAGNOSIS — G4733 Obstructive sleep apnea (adult) (pediatric): Secondary | ICD-10-CM

## 2022-11-30 DIAGNOSIS — R29818 Other symptoms and signs involving the nervous system: Secondary | ICD-10-CM | POA: Diagnosis present

## 2022-11-30 LAB — COMPREHENSIVE METABOLIC PANEL
ALT: 24 U/L (ref 0–44)
AST: 32 U/L (ref 15–41)
Albumin: 4 g/dL (ref 3.5–5.0)
Alkaline Phosphatase: 97 U/L (ref 38–126)
Anion gap: 8 (ref 5–15)
BUN: 16 mg/dL (ref 6–20)
CO2: 31 mmol/L (ref 22–32)
Calcium: 8.4 mg/dL — ABNORMAL LOW (ref 8.9–10.3)
Chloride: 101 mmol/L (ref 98–111)
Creatinine, Ser: 1.18 mg/dL — ABNORMAL HIGH (ref 0.44–1.00)
GFR, Estimated: 55 mL/min — ABNORMAL LOW (ref >60)
Glucose, Bld: 118 mg/dL — ABNORMAL HIGH (ref 70–99)
Potassium: 3.7 mmol/L (ref 3.5–5.1)
Sodium: 140 mmol/L (ref 135–145)
Total Bilirubin: 0.4 mg/dL (ref 0.3–1.2)
Total Protein: 7.7 g/dL (ref 6.5–8.1)

## 2022-11-30 LAB — CBC WITH DIFFERENTIAL/PLATELET
Abs Immature Granulocytes: 0.04 10*3/uL (ref 0.00–0.07)
Basophils Absolute: 0.1 10*3/uL (ref 0.0–0.1)
Basophils Relative: 1 %
Eosinophils Absolute: 0.2 10*3/uL (ref 0.0–0.5)
Eosinophils Relative: 2 %
HCT: 37.3 % (ref 36.0–46.0)
Hemoglobin: 11.8 g/dL — ABNORMAL LOW (ref 12.0–15.0)
Immature Granulocytes: 0 %
Lymphocytes Relative: 27 %
Lymphs Abs: 2.6 10*3/uL (ref 0.7–4.0)
MCH: 28.4 pg (ref 26.0–34.0)
MCHC: 31.6 g/dL (ref 30.0–36.0)
MCV: 89.9 fL (ref 80.0–100.0)
Monocytes Absolute: 0.5 10*3/uL (ref 0.1–1.0)
Monocytes Relative: 5 %
Neutro Abs: 6.3 10*3/uL (ref 1.7–7.7)
Neutrophils Relative %: 65 %
Platelets: 245 10*3/uL (ref 150–400)
RBC: 4.15 MIL/uL (ref 3.87–5.11)
RDW: 14.5 % (ref 11.5–15.5)
WBC: 9.7 10*3/uL (ref 4.0–10.5)
nRBC: 0 % (ref 0.0–0.2)

## 2022-11-30 LAB — BLOOD GAS, VENOUS
Acid-Base Excess: 5.2 mmol/L — ABNORMAL HIGH (ref 0.0–2.0)
Bicarbonate: 33.7 mmol/L — ABNORMAL HIGH (ref 20.0–28.0)
O2 Saturation: 68.5 %
Patient temperature: 37
pCO2, Ven: 67 mmHg — ABNORMAL HIGH (ref 44–60)
pH, Ven: 7.31 (ref 7.25–7.43)
pO2, Ven: 44 mmHg (ref 32–45)

## 2022-11-30 LAB — TROPONIN I (HIGH SENSITIVITY): Troponin I (High Sensitivity): 3 ng/L (ref <18)

## 2022-11-30 LAB — GLUCOSE, CAPILLARY
Glucose-Capillary: 99 mg/dL (ref 70–99)
Glucose-Capillary: 131 mg/dL — ABNORMAL HIGH (ref 70–99)

## 2022-11-30 LAB — HEMOGLOBIN A1C
Hgb A1c MFr Bld: 6.7 % — ABNORMAL HIGH (ref 4.8–5.6)
Mean Plasma Glucose: 146 mg/dL

## 2022-11-30 LAB — CBG MONITORING, ED
Glucose-Capillary: 104 mg/dL — ABNORMAL HIGH (ref 70–99)
Glucose-Capillary: 113 mg/dL — ABNORMAL HIGH (ref 70–99)

## 2022-11-30 LAB — ETHANOL: Alcohol, Ethyl (B): <10 mg/dL (ref <10)

## 2022-11-30 MED ORDER — LACOSAMIDE 100 MG PO TABS: 1.0000 | ORAL_TABLET | Freq: Two times a day (BID) | ORAL | Status: DC

## 2022-11-30 MED ORDER — LACOSAMIDE 50 MG PO TABS
150.0000 mg | ORAL_TABLET | Freq: Two times a day (BID) | ORAL | Status: DC
  Administered 2022-11-30 – 2022-12-01 (×2): 150 mg via ORAL
  Filled 2022-11-30 (×2): qty 3

## 2022-11-30 MED ORDER — SODIUM CHLORIDE 0.9 % IV SOLN
100.0000 mg | INTRAVENOUS | Status: AC
  Administered 2022-11-30: 100 mg via INTRAVENOUS
  Filled 2022-11-30: qty 10

## 2022-11-30 MED ORDER — INSULIN ASPART 100 UNIT/ML IJ SOLN
3.0000 [IU] | Freq: Three times a day (TID) | INTRAMUSCULAR | Status: DC
  Filled 2022-11-30: qty 1

## 2022-11-30 MED ORDER — LEVOTHYROXINE SODIUM 50 MCG PO TABS
175.0000 ug | ORAL_TABLET | Freq: Every morning | ORAL | Status: DC
  Administered 2022-12-01: 175 ug via ORAL
  Filled 2022-11-30: qty 1

## 2022-11-30 MED ORDER — INSULIN ASPART 100 UNIT/ML IJ SOLN
0.0000 [IU] | Freq: Three times a day (TID) | INTRAMUSCULAR | Status: DC
  Filled 2022-11-30: qty 1

## 2022-11-30 MED ORDER — NALOXONE HCL 2 MG/2ML IJ SOSY
0.4000 mg | PREFILLED_SYRINGE | Freq: Once | INTRAMUSCULAR | Status: DC
  Filled 2022-11-30: qty 2

## 2022-11-30 MED ORDER — ONDANSETRON HCL 4 MG/2ML IJ SOLN
INTRAMUSCULAR | Status: AC
  Filled 2022-11-30: qty 2

## 2022-11-30 MED ORDER — ENOXAPARIN SODIUM 40 MG/0.4ML IJ SOSY
40.0000 mg | PREFILLED_SYRINGE | INTRAMUSCULAR | Status: DC
  Administered 2022-11-30: 40 mg via SUBCUTANEOUS
  Filled 2022-11-30: qty 0.4

## 2022-11-30 MED ORDER — METOPROLOL SUCCINATE ER 50 MG PO TB24
50.0000 mg | ORAL_TABLET | Freq: Two times a day (BID) | ORAL | Status: DC
  Administered 2022-11-30 – 2022-12-01 (×2): 50 mg via ORAL
  Filled 2022-11-30 (×2): qty 1

## 2022-11-30 MED ORDER — SODIUM CHLORIDE 0.9 % IV BOLUS (SEPSIS)
1000.0000 mL | Freq: Once | INTRAVENOUS | Status: AC
  Administered 2022-11-30: 1000 mL via INTRAVENOUS

## 2022-11-30 MED ORDER — INSULIN ASPART 100 UNIT/ML IJ SOLN: 0.0000 [IU] | Freq: Every day | INTRAMUSCULAR | Status: DC

## 2022-11-30 MED ORDER — SODIUM CHLORIDE 0.9 % IV SOLN: INTRAVENOUS | Status: DC

## 2022-11-30 MED ORDER — OXYCODONE-ACETAMINOPHEN 5-325 MG PO TABS
1.0000 | ORAL_TABLET | Freq: Four times a day (QID) | ORAL | Status: DC | PRN
  Administered 2022-11-30 – 2022-12-01 (×3): 1 via ORAL
  Filled 2022-11-30 (×3): qty 1

## 2022-11-30 MED ORDER — ROPINIROLE HCL 1 MG PO TABS
2.0000 mg | ORAL_TABLET | Freq: Once | ORAL | Status: AC
  Administered 2022-11-30: 2 mg via ORAL
  Filled 2022-11-30: qty 2

## 2022-11-30 MED ORDER — METOCLOPRAMIDE HCL 5 MG/ML IJ SOLN: 10.0000 mg | Freq: Once | INTRAMUSCULAR | Status: DC

## 2022-11-30 MED ORDER — FENTANYL 75 MCG/HR TD PT72: 1.0000 | MEDICATED_PATCH | TRANSDERMAL | Status: DC

## 2022-11-30 MED ORDER — SODIUM CHLORIDE 0.9 % IV BOLUS
1000.0000 mL | Freq: Once | INTRAVENOUS | Status: AC
  Administered 2022-11-30: 1000 mL via INTRAVENOUS

## 2022-11-30 NOTE — Assessment & Plan Note (Signed)
Stable from a resp standpoint  Cont home inhalers   

## 2022-11-30 NOTE — H&P (Addendum)
History and Physical    Patient: Brittany Nguyen [id number] DOB: 1966/06/05 DOA: 11/30/2022 DOS: the patient was seen and examined on 11/30/2022 PCP: [name], MD  Patient coming from: Home  Chief Complaint:  Chief Complaint  Patient presents with   Seizure Like Activity   HPI: Brittany Nguyen is a 57 y.o. female with medical history significant of multiple medical issues including QTc prolongation, hypertension, tightrope controlled diabetes, COPD/asthma, CAD, CHF with EF 45% status post AICD, hypothyroidism, GERD, depression, DVT status post IVC filter, carcinoid tumor of the stomach, chronic pain presenting with encephalopathy.  Patient noted to have been evaluated yesterday in the ER for left knee pain.  Patient noted had a left total knee replacement back in December.  Has had worsening pain as well as paresthesias around the knee.  Was evaluated by the ER physician and placed on a course of Valium.  Per report, patient was found to be lethargic by the husband with some generalized shaking.  Patient unclear of any true loss of consciousness.  Patient reports similar episode approximately 3 to 4 months ago to which patient was admitted to the hospital for similar issues with formal diagnosis of concern for possible psychogenic component of seizures.  No fevers or chills.  No head trauma.  No reported fall.  Noted to be on multiple sedating medications including fentanyl, morphine, Percocet, Neurontin, Flexeril.  Patient states symptoms started after taking Valium. Presented to the ER ED afebrile, initial systolic pressures in the 80s over 60s.  Improved to the 90s over 70s.  Intermittent oxygen use up to 3 L.  White count 9.7, hemoglobin 11.8, glucose 113, VBG stable, creatinine 1.18.  Troponin within normal limits.  UDS pending.  CT head within normal limits.  Chest x-ray with vascular congestion. Review of Systems: As mentioned in the history of present illness. All other  systems reviewed and are negative. Past Medical History:  Diagnosis Date   Acute anterolateral wall MI ([place]) 08/2016   AICD (automatic cardioverter/defibrillator) present 03/29/2017   a.) Medtronic Evera MRI XT DR SureScan (SN: [id number])   Anxiety    Arthritis    Asthma 2013   C. 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   a.) Bx 02/22/2018 --> NE tumor cells --> AE1/AE3, chromogranin, synaptophysin (+); Ki-67 proliferation rate <1%; b.) repeat Bx 08/24/2018 --> well differentiard NET (G1)   Chronic, continuous use of opioids    a.) has naloxone Rx available   COPD (chronic obstructive pulmonary disease) ([place])    Depression    Diet-controlled type 2 diabetes mellitus ([place])    Diverticulitis 2010   DVT (deep venous thrombosis) ([place])    Endometriosis 1990   Generalized epilepsy ([place])    a.) on lacosamide; b.) last seizure 07-30-22 in setting of hypokalemia (K+ 2.6)   GERD (gastroesophageal reflux disease)    GIB (gastrointestinal bleeding) 01/08/2018   HFrEF (heart failure with reduced ejection fraction) ([place])    a.)TTE 12/09/16: EF 25-30%, dif HK; b.) TTE 02/08/17: EF 25%, dif HK, mild TR, G1DD; c.) TTE 04/25/17: EF 30-35%, mild LVH; d.) TTE 01/30/18: EF 45%, MAC, LAE, mild MR, G1DD; e.) TTE 11/06/2018: EF 25%, sev glob HK, triv MR/TR/PR; f.) TTE 05/12/19: EF 35%, MAC, AoV sclerosis, G1DD; g.) TTE 11/05/19: EF 20%, sev glob HK, triv MR; h.) TTE 06/17/20: EF 40%, mod glob HK, triv MR; I:) TTE 06/14/22: EF 25-30%, G1DD   History of cardiac catheterization  a.) LHC 10/21/2014: normal coronaries; b.) R/LHC 02/24/2017: normal coronaries, mRA 12, mPA 28, mPCWP 15, CO 8.0, CI 3.8   History of kidney stones    Hx MRSA infection    Hypokalemia    Hypothyroidism    Iron deficiency anemia 03/27/2018   Lump or mass in breast 11/27/2012   a.) Bx 11/27/2012 --> apocrine wall cyst   Migraine    Nausea & vomiting    Neuropathy    Non-ischemic  cardiomyopathy ([place])    a.) TTE 12/09/2016: EF 25-30%, b.) TTE 02/08/2017: EF 25%; c.) Medtronic AICD placed 03/29/2017; d.) TTE 04/25/2017: EF 30-35%; e.) TTE 01/30/2018: EF 45%; f.) TTE 11/06/2018: EF 25%; g.) TTE 05/12/2019: EF 35%; h.) TTE 11/05/2019: EF 20%; I.) TTE 06/17/2020: EF 40%; J.) TTE 06/14/2022: EF 25-30%   Obesity    OSA on CPAP    PAF (paroxysmal atrial fibrillation) (HCC)    PONV (postoperative nausea and vomiting)    Post-COVID chronic cough    Postcholecystectomy diarrhea    Prolonged Q-T interval on ECG    Restless leg    a.) on ropinirole   Unstable angina ([place])    Past Surgical History:  Procedure Laterality Date   ABDOMINAL HYSTERECTOMY     age 35   BREAST BIOPSY Right 2014   benign   CARDIAC DEFIBRILLATOR PLACEMENT Left 03/29/2017   Procedure: [place] (AICD); Location: [facility]; Surgeon: Burnett Sheng, MD   CESAREAN SECTION     CHOLECYSTECTOMY     COLECTOMY     COLONOSCOPY WITH PROPOFOL N/A 02/22/2018   Procedure: COLONOSCOPY WITH PROPOFOL;  Surgeon: Toledo, Benay Pike, MD;  Location: [facility];  Service: Gastroenterology;  Laterality: N/A;   ESOPHAGOGASTRODUODENOSCOPY (EGD) WITH PROPOFOL N/A 02/22/2018   Procedure: ESOPHAGOGASTRODUODENOSCOPY (EGD) WITH PROPOFOL;  Surgeon: Toledo, Benay Pike, MD;  Location: [facility];  Service: Gastroenterology;  Laterality: N/A;   IVC FILTER INSERTION N/A 08/23/2022   Procedure: IVC FILTER INSERTION;  Surgeon: Katha Cabal, MD;  Location: [facility];  Service: Cardiovascular;  Laterality: N/A;   KNEE ARTHROPLASTY Left 08/24/2022   Procedure: COMPUTER ASSISTED TOTAL KNEE ARTHROPLASTY;  Surgeon: Dereck Leep, MD;  Location: [facility];  Service: Orthopedics;  Laterality: Left;   LEFT HEART CATH AND CORONARY ANGIOGRAPHY Left 10/21/2014   Procedure: LEFT HEART CATH AND CORONARY ANGIOGRAPHY; Location: [facility]; Surgeon: Serafina Royals, MD   NASAL SINUS SURGERY  2012    OOPHORECTOMY     RIGHT/LEFT HEART CATH AND CORONARY ANGIOGRAPHY Bilateral 02/24/2017   Procedure: RIGHT/LEFT HEART CATH AND CORONARY ANGIOGRAPHY; Location: [facility]   THYROIDECTOMY     Social History:  reports that she has never smoked. She has never used smokeless tobacco. She reports that she does not drink alcohol and does not use drugs.  Allergies  Allergen Reactions   Contrast Media [Iodinated Contrast Media] Shortness Of Breath    Per CT report in 2014 pt had break through contrast reaction with hives and SOB following 13 hour prep. MSY    Ferumoxytol Nausea Only   Iron Sucrose Anaphylaxis   Lidocaine Hives   Metrizamide Shortness Of Breath   Penicillins Hives and Other (See Comments)    IgE = 4 (WNL) on 08/12/2022  ediate rash, facial/tongue/throat swelling, SOB or lightheadedness with hypotension, tachy   Sacubitril-Valsartan     Sz (unclear if new start entresto vs hypoglycemia)   Isosorbide Nitrate Other (See Comments)    Headache   Latex Hives   Ondansetron Other (See  Comments)    Severe headache   Tizanidine Other (See Comments)    Feels altered   Povidone-Iodine Rash    Blistering rash   Pulmicort [Budesonide] Itching    Family History  Problem Relation Age of Onset   Cancer Mother 56       ovarian   CAD Mother    Cancer Father 40       brain   CAD Father    Cancer Daughter 90       skin   Cancer Maternal Aunt 91       breast   Leukemia Paternal Grandfather     Prior to Admission medications   Medication Sig Start Date End Date Taking? Authorizing Provider  escitalopram (LEXAPRO) 10 MG tablet Take 10 mg by mouth daily. at 9 AM 11/24/22  Yes [provider]  acetaminophen (TYLENOL) 500 MG tablet Take 2 tablets (1,000 mg total) by mouth every 6 (six) hours as needed. 09/11/22 09/11/23  Teodoro Spray, PA  albuterol (ACCUNEB) 1.25 MG/3ML nebulizer solution Take 1 ampule by nebulization every 6 (six) hours as needed for wheezing. Patient not taking:  Reported on 08/23/2022    [provider]  albuterol (VENTOLIN HFA) 108 (90 Base) MCG/ACT inhaler Inhale 1-2 puffs into the lungs every 6 (six) hours as needed for wheezing or shortness of breath. Patient not taking: Reported on 08/23/2022    [provider]  azithromycin (ZITHROMAX) 250 MG tablet Take 250 mg by mouth as directed. 11/24/22   [provider]  calcium-vitamin D (Darron Doom WITH D) 500-5 MG-MCG tablet Take 1 tablet by mouth daily with breakfast. 09/20/22   Emeterio Reeve, DO  cyclobenzaprine (FLEXERIL) 5 MG tablet Take 5 mg by mouth 4 (four) times daily as needed. 08/31/22   [provider]  diazepam (VALIUM) 2 MG tablet Take 1 tablet (2 mg total) by mouth every 8 (eight) hours as needed for muscle spasms. 11/29/22   Paulette Blanch, MD  fentaNYL (DURAGESIC) 75 MCG/HR Place 1 patch onto the skin every 3 (three) days. 01/12/22   [provider]  fentaNYL (DURAGESIC) 75 MCG/HR Place 1 patch onto the skin every 3 (three) days. 11/17/22 01/14/23  [provider]  furosemide (LASIX) 40 MG tablet Take 40 mg by mouth 2 (two) times daily. 02/17/22   [provider]  furosemide (LASIX) 40 MG tablet Take 80 mg by mouth 2 (two) times daily. 11/09/22 11/09/23  [provider]  gabapentin (NEURONTIN) 100 MG capsule Take 100 mg by mouth at bedtime. 10/21/22   [provider]  gabapentin (NEURONTIN) 300 MG capsule Take 1 capsule (300 mg total) by mouth at bedtime. 09/20/22   Emeterio Reeve, DO  Lacosamide 100 MG TABS Take 1 tablet by mouth 2 (two) times daily.    [provider]  levothyroxine (SYNTHROID) 175 MCG tablet Take 175 mcg by mouth every morning. 12/17/21   [provider]  LORazepam (ATIVAN) 1 MG tablet Take 1 tablet (1 mg total) by mouth 4 (four) times daily as needed. 11/10/21   Lorella Nimrod, MD  losartan (COZAAR) 25 MG tablet Take 12.5 mg by mouth 2 (two) times daily. 11/15/22   [provider]   magnesium oxide (MAG-OX) 400 MG tablet Take 1 tablet (400 mg total) by mouth daily. 09/20/22   Emeterio Reeve, DO  Melatonin 3 MG CAPS Take 1-2 capsules (3-6 mg total) by mouth at bedtime. 09/20/22   Emeterio Reeve, DO  methocarbamol (ROBAXIN) 500 MG  tablet Take 1,000 mg by mouth 2 (two) times daily. 10/14/22   [provider]  metoprolol succinate (TOPROL-XL) 50 MG 24 hr tablet Take 50 mg by mouth 2 (two) times daily. 02/16/22   [provider]  morphine (MSIR) 15 MG tablet Take 15 mg by mouth every 4 (four) hours as needed. 02/17/22   [provider]  naloxone [facility]) nasal spray 4 mg/0.1 mL Place 1 spray into the nose as needed for opioid reversal. May repeat once in opposite nare. 10/05/21   [provider]  nitroGLYCERIN (NITROSTAT) 0.4 MG SL tablet Place 0.4 mg under the tongue every 5 (five) minutes x 3 doses as needed for chest pain. 11/23/21   [provider]  oxyCODONE-acetaminophen (PERCOCET/ROXICET) 5-325 MG tablet Take 1-2 tablets by mouth every 4 (four) hours as needed (breakthrough pain). 01/17/20   [provider]  Potassium Chloride ER 20 MEQ TBCR Take 1 tablet by mouth in the morning and at bedtime. 09/20/22   Emeterio Reeve, DO  promethazine (PHENERGAN) 25 MG tablet Take 25 mg by mouth every 4 (four) hours as needed for nausea/vomiting. 12/13/21   [provider]  rOPINIRole (REQUIP) 4 MG tablet Take 4 mg by mouth at bedtime. 01/30/19   [provider]  spironolactone (ALDACTONE) 50 MG tablet Take 50 mg by mouth 2 (two) times daily. 11/09/22   [provider]    Physical Exam: Vitals:   11/30/22 1100 11/30/22 1130 11/30/22 1200 11/30/22 1230  BP: 93/71 96/65 (!) 88/69   Pulse: 61 63 65   Resp: 19 (!) 22 17   Temp:    97.8 F (36.6 C)  TempSrc:    Oral  SpO2: 99% 97% 100%   Weight:      Height:       Physical Exam Constitutional:      General: She is not in acute distress.    Appearance:  She is obese.  HENT:     Head: Normocephalic.     Mouth/Throat:     Mouth: Mucous membranes are moist.  Eyes:     Pupils: Pupils are equal, round, and reactive to light.  Cardiovascular:     Rate and Rhythm: Normal rate and regular rhythm.  Pulmonary:     Effort: Pulmonary effort is normal.     Breath sounds: Normal breath sounds.  Abdominal:     General: Abdomen is flat. Bowel sounds are normal.  Musculoskeletal:        General: Normal range of motion.     Cervical back: Normal range of motion.  Skin:    General: Skin is warm.  Neurological:     General: No focal deficit present.  Psychiatric:        Mood and Affect: Mood normal.     Data Reviewed:  There are no new results to review at this time. DG Chest Port 1 View CLINICAL DATA:  Arms trembling and shallow breathing. History of seizures. Somnolence.  EXAM: PORTABLE CHEST 1 VIEW  COMPARISON:  Chest radiograph 10/11/2022.  FINDINGS: The left chest wall cardiac device and associated leads are stable.  The cardiac silhouette is enlarged, likely exaggerated by AP technique but increased from prior. The upper mediastinal contours are within normal limits.  Lung volumes are low. There is vascular congestion and possible mild pulmonary interstitial edema. There is no focal airspace opacity. There is no significant pleural effusion. There is no pneumothorax  There is no acute osseous abnormality.  IMPRESSION: 1. Enlarged  cardiac silhouette which appears increased from prior. This may be exaggerated due to AP technique; however, a pericardial effusion is not excluded. 2. Vascular congestion and possible mild pulmonary interstitial edema.  Electronically Signed   By: Valetta Mole M.D.   On: 11/30/2022 09:04 CT Head Wo Contrast CLINICAL DATA:  Mental status change, persistent or worsening. Seizure-like activity.  EXAM: CT HEAD WITHOUT CONTRAST  TECHNIQUE: Contiguous axial images were obtained from the  base of the skull through the vertex without intravenous contrast.  RADIATION DOSE REDUCTION: This exam was performed according to the departmental dose-optimization program which includes automated exposure control, adjustment of the mA and/or kV according to patient size and/or use of iterative reconstruction technique.  COMPARISON:  Head CT 09/19/2022  FINDINGS: Brain: There is no evidence of an acute infarct, intracranial hemorrhage, mass, midline shift, or extra-axial fluid collection. The ventricles and sulci are normal.  Vascular: No hyperdense vessel.  Skull: No acute fracture or suspicious osseous lesion.  Sinuses/Orbits: Prior functional endoscopic sinus surgery without evidence of acute inflammatory sinus disease. Unchanged left frontoethmoidal sinus osteoma. Trace left mastoid effusion. Unremarkable orbits.  Other: None.  IMPRESSION: Unremarkable CT appearance of the brain.  Electronically Signed   By: Logan Bores M.D.   On: 11/30/2022 08:39  Lab Results  Component Value Date   WBC 9.7 11/30/2022   HGB 11.8 (L) 11/30/2022   HCT 37.3 11/30/2022   MCV 89.9 11/30/2022   PLT 245 [id number]   Last metabolic panel Lab Results  Component Value Date   GLUCOSE 118 (H) 11/30/2022   NA 140 11/30/2022   K 3.7 11/30/2022   CL 101 11/30/2022   CO2 31 11/30/2022   BUN 16 11/30/2022   CREATININE 1.18 (H) 11/30/2022   GFRNONAA 55 (L) 11/30/2022   CALCIUM 8.4 (L) 11/30/2022   PHOS 6.3 (H) 09/19/2022   PROT 7.7 11/30/2022   ALBUMIN 4.0 11/30/2022   BILITOT 0.4 11/30/2022   ALKPHOS 97 11/30/2022   AST 32 11/30/2022   ALT 24 11/30/2022   ANIONGAP 8 11/30/2022    Assessment and Plan: * Encephalopathy Suspect toxic metabolic etiology in the setting of polypharmacy Noted worsening lethargy with?  Generalized shaking witnessed by the husband earlier in the day Recently started on Valium for left knee pain Also multiple narcotic medications include fentanyl,  morphine and oxycodone as well as other sedating medications including Neurontin and Flexeril Mentation has slowly improved over the course of the day with with holding offending medications CT head stable Will check ammonia level Will hold offending medications including Valium, Neurontin, Flexeril Continue fentanyl patch for now Hold morphine and minimize oxycodone Seizure event less likely on the differential diagnosis given patient's use of Valium associated with the event Neurology aware of the case-will monitor for now  Will check a Vimpat level   Seizure ([place]) On Vimpat chronically. Noted admission back in January for similar issues with some concern for psychogenic component Vimpat level pending Formally consult neuro as clinically indicated.    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 Marry Guan Noted persistent recurrent pain Pending formal orthopedic surgery evaluation      Advance Care Planning:   Code Status: Full Code   Consults: Initial eval by PCCM for hypotension  Family Communication: No family at the bedside   Severity of Illness: The appropriate patient status for this patient is OBSERVATION. Observation status is judged to  be reasonable and necessary in order to provide the required intensity of service to ensure the patient's safety. The patient's presenting symptoms, physical exam findings, and initial radiographic and laboratory data in the context of their medical condition is felt to place them at decreased risk for further clinical deterioration. Furthermore, it is anticipated that the patient will be medically stable for discharge from the hospital within 2 midnights of admission.   Author: Deneise Lever, MD 11/30/2022 1:15 PM  For on call review [email or web address].

## 2022-11-30 NOTE — ED Notes (Signed)
Per Dr Mortimer Fries, pt does not need bipap anymore. Pt now rouses to voice and is responding appropriately. Is oriented times 4. Speech is clear.

## 2022-11-30 NOTE — ED Provider Triage Note (Signed)
Emergency Medicine Provider Triage Evaluation Note  Brittany Nguyen , a 57 y.o. female  was evaluated in triage.  Pt complains of seizure-like activity.  Review of Systems  Positive: Seizure-like activity Negative: Unable to assess due to altered mental status  Physical Exam  Pulse 76   Temp 97.7 F (36.5 C) (Oral)   Resp 16   Ht 5\' 5"  (1.651 m)   Wt 86.2 kg   LMP 01/22/2012 (Approximate) Comment: Hysterectomy 5 years ago  SpO2 90%   BMI 31.62 kg/m  Gen:   Awake, will open eyes spontaneously and answer questions intermittently but very drowsy Resp:  Normal effort, intermittently sats will drop into the upper 80s but improving on nasal cannula Neuro:   Follows commands intermittently   Medical Decision Making  Medically screening exam initiated at 6:51 AM.  Appropriate orders placed.  Evon Slack Eggleton was informed that the remainder of the evaluation will be completed by another provider, this initial triage assessment does not replace that evaluation, and the importance of remaining in the ED until their evaluation is complete.  This is a 57 year old female history of COPD, chronic pain on chronic opioids, paroxysmal A-fib, CHF status post pacemaker/AICD, hypothyroidism, history of prolonged QT interval, seizures on Vimpat who presents to the emergency department with EMS for concerns for seizure activity.  EMS reports husband stated 45 minutes worth of seizure-like activity on and off that they described as tremors in her hands.  EMS states she was unresponsive on arrival and they gave 5 mg of IM Versed for mild tremors.  No gaze preference.  No tonic-clonic activity.  No incontinence or tongue biting.  She did have a fentanyl patch to her left upper extremity which was removed by EMS.  Recently was prescribed diazepam for knee pain.  She is also on gabapentin, Ativan, morphine, Percocet, Phenergan based on her MAR.  Blood sugars in the 200s with EMS.  Initially hypotensive with  EMS but they report this improved after Versed.  No IV fluids given in route.   Labs, urine, IV Keppra ordered.  Patient stable to be seen by oncoming ED provider.   Jontavious Commons, Delice Bison, DO 11/30/22 [phone]

## 2022-11-30 NOTE — Assessment & Plan Note (Signed)
No active CP  Cont home regimen

## 2022-11-30 NOTE — ED Notes (Signed)
EDP said to cancel reglan and zofran that was ordered prior to shift change.

## 2022-11-30 NOTE — ED Notes (Signed)
First encounter with pt, pt is sleeping with unlabored respirations. Husband at bedside. Husband states pt goes to pain clinic and had knee replacement in Jan. States pt had seizure like activity after taking first dose of prescribed diazepam last night. States arms of pt were trembling and her breathing was shallow. Pt has fentanyl patch, husband unsure of location. Pt has hx seizures (takes lacosamide). Received 5mg  versed IM with EMS en route here. Pt opens eyes to voice. Follows commands slowly.

## 2022-11-30 NOTE — ED Notes (Signed)
Both infusions paused. Pt has received 1100 mL from the combined 1L bags as well as 546mL with EMS. Pt continues to sleep. Husband has stepped out. Waiting on lacosamide from pharmacy (messaged already).

## 2022-11-30 NOTE — ED Notes (Signed)
Pt continues to sleep. 97% on 2L [place].

## 2022-11-30 NOTE — Assessment & Plan Note (Signed)
Cont synthroid 

## 2022-11-30 NOTE — ED Notes (Signed)
Fentanyl patch dated 3/25 found on shirt of pt and removed. Looked with EDP on back and both arms and no other patches found.

## 2022-11-30 NOTE — Assessment & Plan Note (Signed)
Suspect toxic metabolic etiology in the setting of polypharmacy Noted worsening lethargy with?  Generalized shaking witnessed by the husband earlier in the day Recently started on Valium for left knee pain Also multiple narcotic medications include fentanyl, morphine and oxycodone as well as other sedating medications including Neurontin and Flexeril Mentation has slowly improved over the course of the day with with holding offending medications CT head stable Will check ammonia level Will hold offending medications including Valium, Neurontin, Flexeril Continue fentanyl patch for now Hold morphine and minimize oxycodone Seizure event less likely on the differential diagnosis given patient's use of Valium associated with the event Neurology aware of the case-will monitor for now  Will check a Vimpat level

## 2022-11-30 NOTE — ED Notes (Signed)
Called RT to come place bipap. About to give  Narcan.

## 2022-11-30 NOTE — ED Notes (Signed)
Messaged pharmacy for lacosamide. Messaged provider to ask whether still to give reglan and zofran.

## 2022-11-30 NOTE — ED Notes (Signed)
Informed EDP of VBG results.

## 2022-11-30 NOTE — Assessment & Plan Note (Addendum)
01/2018 2D ECHO w/ EF 45% and grade 1 diastolic dysfunction  Mild vascular congestion on imaging Monitor volume status

## 2022-11-30 NOTE — ED Notes (Signed)
No changes in neuro status at 0730 compared to 0700.

## 2022-11-30 NOTE — Progress Notes (Signed)
CHIEF COMPLAINT:   Chief Complaint  Patient presents with   Seizure Like Activity    Subjective  Patient was bought to ER for possible Seizures PCCM called to admit, but  patient is more alert and awake Likely related to polypharmacy and pain meds due to RT knee pain     Objective   Examination:  General exam: Appears calm and comfortable  Respiratory system: Clear to auscultation. Respiratory effort normal. HEENT: [employer]/AT, PERRLA, no thrush, no stridor. Cardiovascular system: S1 & S2 heard, RRR. No JVD, murmurs, rubs, gallops or clicks. No pedal edema. Gastrointestinal system: Abdomen is nondistended, soft and nontender. No organomegaly or masses felt. Normal bowel sounds heard. Central nervous system: Alert and oriented. No focal neurological deficits. Extremities: RT KNEE INCISION INTACT, RT KNEE IS WARM TO TOUCH Skin: No rashes, lesions or ulcers Psychiatry: Judgement and insight appear normal. Mood & affect appropriate.   VITALS:  height is 5\' 5"  (1.651 m) and weight is 86.2 kg. Her oral temperature is 97.7 F (36.5 C). Her blood pressure is 98/77 and her pulse is 58 (abnormal). Her respiration is 15 and oxygen saturation is 100%.   I personally reviewed Labs under Results section.  Radiology Reports DG Chest Port 1 View  Result Date: 11/30/2022 CLINICAL DATA:  Arms trembling and shallow breathing. History of seizures. Somnolence. EXAM: PORTABLE CHEST 1 VIEW COMPARISON:  Chest radiograph 10/11/2022. FINDINGS: The left chest wall cardiac device and associated leads are stable. The cardiac silhouette is enlarged, likely exaggerated by AP technique but increased from prior. The upper mediastinal contours are within normal limits. Lung volumes are low. There is vascular congestion and possible mild pulmonary interstitial edema. There is no focal airspace opacity. There is no significant pleural effusion. There is no pneumothorax There is no acute osseous abnormality.  IMPRESSION: 1. Enlarged cardiac silhouette which appears increased from prior. This may be exaggerated due to AP technique; however, a pericardial effusion is not excluded. 2. Vascular congestion and possible mild pulmonary interstitial edema. Electronically Signed   By: Valetta Mole M.D.   On: 11/30/2022 09:04   CT Head Wo Contrast  Result Date: 11/30/2022 CLINICAL DATA:  Mental status change, persistent or worsening. Seizure-like activity. EXAM: CT HEAD WITHOUT CONTRAST TECHNIQUE: Contiguous axial images were obtained from the base of the skull through the vertex without intravenous contrast. RADIATION DOSE REDUCTION: This exam was performed according to the departmental dose-optimization program which includes automated exposure control, adjustment of the mA and/or kV according to patient size and/or use of iterative reconstruction technique. COMPARISON:  Head CT 09/19/2022 FINDINGS: Brain: There is no evidence of an acute infarct, intracranial hemorrhage, mass, midline shift, or extra-axial fluid collection. The ventricles and sulci are normal. Vascular: No hyperdense vessel. Skull: No acute fracture or suspicious osseous lesion. Sinuses/Orbits: Prior functional endoscopic sinus surgery without evidence of acute inflammatory sinus disease. Unchanged left frontoethmoidal sinus osteoma. Trace left mastoid effusion. Unremarkable orbits. Other: None. IMPRESSION: Unremarkable CT appearance of the brain. Electronically Signed   By: Logan Bores M.D.   On: 11/30/2022 08:39       Assessment/Plan:  MENTAL STATUS CHANGES DUE TO NARCOTICS NAD MUSCLE RELAXANTS LOW LIKELIHOOD OF SEIZURES  RT KNEE PAIN RECOMMEND CONSULTATION WITH ORTHOPEDICS DR HOOTEN   DOES NOT MEET ICU CRITERIA NO NEED FOR BIPAP NO NEED FOR NARCAN  RECOMMEND TRH ADMISSION IF NEEDED, POSSIBLE DISCHARGE IF ORTHO FEELS CAN MANAGE AS OUTPATIENT    Corrin Parker, M.D.  [facility]  Medical Director  [place] Director [facility] Cardio-Pulmonary Department

## 2022-11-30 NOTE — ED Notes (Signed)
Pt in CT.

## 2022-11-30 NOTE — Assessment & Plan Note (Signed)
qTc 474 today  Monitor

## 2022-11-30 NOTE — ED Provider Notes (Signed)
[facility] Provider Note    Event Date/Time   First MD Initiated Contact with Patient 11/30/22 [phone]     (approximate)   History   Seizure Like Activity    HPI  Brittany Nguyen is a 57 y.o. female with a very extensive past medical history but quite pertinent as noted to history of seizures, cardiomyopathy, coronary artery disease, asthma  Extensive PMH includes per recent PCP annual exam: Diagnosis  Esophageal reflux  OSA on CPAP  Heart palpitations  Chronic combined systolic and diastolic CHF (congestive heart failure) (CMS-HCC)  Postcholecystectomy diarrhea  Abdominal wall pain in right flank  Iron deficiency anemia  Intractable migraine with aura without status migrainosus  Chronic diarrhea of unknown origin  Anemia  Automatic implantable cardioverter-defibrillator in situ  Cough, persistent  Hypokalemia  Lump or mass in breast  NICM (nonischemic cardiomyopathy) (CMS-HCC)  Nontoxic multinodular goiter  Osteoarthritis of knee  Restless legs syndrome  Coronary artery disease involving native coronary artery of native heart  Mild intermittent asthma without complication  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CMS-HCC)  Chronic abdominal pain, unspecified  Moderate protein-calorie malnutrition (CMS-HCC)  Neuroendocrine carcinoma (CMS-HCC)  Hypothyroidism, postsurgical  Seizure (CMS-HCC)  Cancer related pain  Post-COVID chronic cough  Breast cancer (CMS-HCC)  CHF (congestive heart failure) (CMS-HCC)  MRSA (methicillin resistant Staphylococcus aureus)  Bloody stool  Nausea and vomiting  Preoperative evaluation to rule out surgical contraindication  Anxiety  Migraine  Neuroendocrine neoplasm of stomach  Neuropathy  UTI (urinary tract infection)  Asthma  Current moderate episode of major depressive disorder (CMS-HCC)  Depression with anxiety  Essential hypertension  Hypocalcemia  Prolonged QT interval  Viral syndrome  Diarrhea   Status post total left knee replacement  Seizures (CMS-HCC)  Emotional stress  A-fib (CMS-HCC)     Again the patient has a very extensive active medical history.  EM caveat: Patient was very lethargic.  Per the patient's husband who is here at the bedside he reports that he got a phone call from her saying that she did not feel well.  He went in and she was "shaking" which she reports looks similar to a seizure.  She was less responsive.  He reports she is now opening her eyes and seems to be getting slowly more responsive.  He relates that she has had similar episodes in the past  Also he reports that yesterday they were seen at the ER and she was given a new fentanyl patch and started Valium by mouth.  She took the first dose last evening.  She also takes multiple other medications and he reports she is compliant with her Vimpat.  Physical Exam   Triage Vital Signs: ED Triage Vitals  Enc Vitals Group     BP 11/30/22 0649 (!) 82/62     Pulse Rate 11/30/22 0644 76     Resp 11/30/22 0644 16     Temp 11/30/22 0644 97.7 F (36.5 C)     Temp Source 11/30/22 0644 Oral     SpO2 11/30/22 0644 90 %     Weight 11/30/22 0646 190 lb (86.2 kg)     Height 11/30/22 0646 5\' 5"  (1.651 m)     Head Circumference --      Peak Flow --      Pain Score 11/30/22 0646 0     Pain Loc --      Pain Edu? --      Excl. in [place]? --  Most recent vital signs: Vitals:   11/30/22 1030 11/30/22 1100  BP: 98/77 93/71  Pulse: (!) 58 61  Resp: 15 19  Temp:    SpO2: 100% 99%     General: Lethargic.  Opens her eyes to voice.  Is able to follow basic commands.  She opens her eyes for about 4 to 5 seconds, asked her to move her hands and she does so on both sides and demonstrates grip strength.  She is appears quite somnolent though.  Resting comfortably without acute distress but very fatigued. Pupils are slightly constricted.  Extraocular movements are conjugate.  Opens mouth and opens both eyes without droop  or difficulty CV:  Good peripheral perfusion.  Normal tones and rate Resp:  Normal effort.  Clear bilaterally.  Respiratory rate 12-16.  No distress.  No diminished lung sounds Abd:  No distention.  Other:  Warm well-perfused extremities   ED Results / Procedures / Treatments   Labs (all labs ordered are listed, but only abnormal results are displayed) Labs Reviewed  COMPREHENSIVE METABOLIC PANEL - Abnormal; Notable for the following components:      Result Value   Glucose, Bld 118 (*)    Creatinine, Ser 1.18 (*)    Calcium 8.4 (*)    GFR, Estimated 55 (*)    All other components within normal limits  CBC WITH DIFFERENTIAL/PLATELET - Abnormal; Notable for the following components:   Hemoglobin 11.8 (*)    All other components within normal limits  BLOOD GAS, VENOUS - Abnormal; Notable for the following components:   pCO2, Ven 67 (*)    Bicarbonate 33.7 (*)    Acid-Base Excess 5.2 (*)    All other components within normal limits  CBG MONITORING, ED - Abnormal; Notable for the following components:   Glucose-Capillary 104 (*)    All other components within normal limits  CBG MONITORING, ED - Abnormal; Notable for the following components:   Glucose-Capillary 113 (*)    All other components within normal limits  ETHANOL  URINALYSIS, ROUTINE W REFLEX MICROSCOPIC  URINE DRUG SCREEN, QUALITATIVE ([facility] ONLY)  TROPONIN I (HIGH SENSITIVITY)     EKG  And interpreted by me at 7 AM heart rate 75 QRS 110 QTc 470 Evidence of left anterior fascicular block possible old inferolateral MI. Similar morphology to January 15, however Q waves now present in lateral precordial region as well  RADIOLOGY  CT head interpreted by me as negative for acute gross intracranial hemorrhage  Chest x-ray interpreted as possible enlargement of cardiac silhouette.  Radiologist notes cannot exclude pericardial effusion.  Some vascular congestion and pulmonary interstitial edema suspected  DG Chest Port  1 View  Result Date: 11/30/2022 CLINICAL DATA:  Arms trembling and shallow breathing. History of seizures. Somnolence. EXAM: PORTABLE CHEST 1 VIEW COMPARISON:  Chest radiograph 10/11/2022. FINDINGS: The left chest wall cardiac device and associated leads are stable. The cardiac silhouette is enlarged, likely exaggerated by AP technique but increased from prior. The upper mediastinal contours are within normal limits. Lung volumes are low. There is vascular congestion and possible mild pulmonary interstitial edema. There is no focal airspace opacity. There is no significant pleural effusion. There is no pneumothorax There is no acute osseous abnormality. IMPRESSION: 1. Enlarged cardiac silhouette which appears increased from prior. This may be exaggerated due to AP technique; however, a pericardial effusion is not excluded. 2. Vascular congestion and possible mild pulmonary interstitial edema. Electronically Signed   By: Court Joy.D.  On: 11/30/2022 09:04   CT Head Wo Contrast  Result Date: 11/30/2022 CLINICAL DATA:  Mental status change, persistent or worsening. Seizure-like activity. EXAM: CT HEAD WITHOUT CONTRAST TECHNIQUE: Contiguous axial images were obtained from the base of the skull through the vertex without intravenous contrast. RADIATION DOSE REDUCTION: This exam was performed according to the departmental dose-optimization program which includes automated exposure control, adjustment of the mA and/or kV according to patient size and/or use of iterative reconstruction technique. COMPARISON:  Head CT 09/19/2022 FINDINGS: Brain: There is no evidence of an acute infarct, intracranial hemorrhage, mass, midline shift, or extra-axial fluid collection. The ventricles and sulci are normal. Vascular: No hyperdense vessel. Skull: No acute fracture or suspicious osseous lesion. Sinuses/Orbits: Prior functional endoscopic sinus surgery without evidence of acute inflammatory sinus disease. Unchanged left  frontoethmoidal sinus osteoma. Trace left mastoid effusion. Unremarkable orbits. Other: None. IMPRESSION: Unremarkable CT appearance of the brain. Electronically Signed   By: Logan Bores M.D.   On: 11/30/2022 08:39      PROCEDURES:  Critical Care performed: Yes, see critical care procedure note(s)  CRITICAL CARE Performed by: Delman Kitten   Total critical care time: 25 minutes  Critical care time was exclusive of separately billable procedures and treating other patients.  Critical care was necessary to treat or prevent imminent or life-threatening deterioration.  Critical care was time spent personally by me on the following activities: development of treatment plan with patient and/or surrogate as well as nursing, discussions with consultants, evaluation of patient's response to treatment, examination of patient, obtaining history from patient or surrogate, ordering and performing treatments and interventions, ordering and review of laboratory studies, ordering and review of radiographic studies, pulse oximetry and re-evaluation of patient's condition.  Patient presented with severe lethargy and alteration of mental status requiring emergent evaluation due to significant change in mental status, need for careful monitoring, workup for acute medical illness admission to the hospital and consultation with our ICU team  Procedures   MEDICATIONS ORDERED IN ED: Medications  sodium chloride 0.9 % bolus 1,000 mL (0 mLs Intravenous Stopped 11/30/22 1102)    And  0.9 %  sodium chloride infusion ( Intravenous New Bag/Given 11/30/22 0804)  sodium chloride 0.9 % bolus 1,000 mL (0 mLs Intravenous Paused 11/30/22 0751)  lacosamide (VIMPAT) 100 mg in sodium chloride 0.9 % 25 mL IVPB (0 mg Intravenous Stopped 11/30/22 0851)     IMPRESSION / MDM / ASSESSMENT AND PLAN / ED COURSE  I reviewed the triage vital signs and the nursing notes.                              Differential diagnosis includes,  but is not limited to, possible seizure with postictal state, metabolic abnormality, polypharmacy especially given the patient took a dose of Valium as well as morphine and her home medications plus fentanyl patch.  She had a fentanyl patch in pond by me in her short on examination, and this was given to [place] for disposal.  The patient seems somnolent, slightly slow respiratory pattern.  Pupils slightly constricted.  I suspect polypharmacy possible opioid or other such as diazepam plus morphine plus fentanyl may be driving her symptoms somewhat today.  Hard to be certain that it is not a postictal state or other causation.  She does not show evidence of focal abnormalities no history of trauma or injury.  I doubt that this is a central neurologic cause  at this point especially given her mental status is slowly improving per the husband.  Evidently she was essentially almost fully unresponsive at the home except for brief shaking in her hands which has resolved.  Now she has no evidence that would suggest ongoing seizure especially given she can follow commands open her eyes.  Will continue observe her closely.  Working up for causation  ----------------------------------------- 8:18 AM on 11/30/2022 ----------------------------------------- Blood pressure hypotensive 86/57 after receiving 1 L of normal saline.  However, the patient notes exam status change no seizure-like activity, still quite somnolent.  Given the ongoing somnolence work CT scan of the head.  Patient's husband reports she has a history of hypotension, and review of old records does demonstrate she was discharged with a blood pressure 99/81 in January.  Also yesterday seen in the ER blood pressure [id number] systolic.  Suspect that this blood pressure reading is somewhat hypotensive for the patient, but not severe drop from her baseline.  She again remains somnolent but responds to tactile stimuli voice.  Currently awaiting CT scan and chest  x-ray  Labs demonstrate normal metabolic panel except interpreted very slight AKI.  CBC normal with mild anemia.    Patient's presentation is most consistent with acute presentation with potential threat to life or bodily function.   The patient is on the cardiac monitor to evaluate for evidence of arrhythmia and/or significant heart rate changes.  Clinical Course as of 11/30/22 1200  Wed Nov 30, 2022  1156 Patient admitted to the hospitalist service Dr. Ernestina Patches.  I consulted with our ICU team and by the time I have seen and evaluated the patient is now alert following commands appears much improved her mental status has returned to almost baseline.  At this juncture I suspect likely polypharmacy given patient's use of Valium fentanyl patches and morphine and no obvious seizure-like activity the noted here.  Her mental status rapidly improving.  Patient admitted to the hospital service for 3 further care and treatment [MQ]    Clinical Course User Index [MQ] Delman Kitten, MD     FINAL CLINICAL IMPRESSION(S) / ED DIAGNOSES   Final diagnoses:  Lethargy  Polypharmacy     Rx / DC Orders   ED Discharge Orders     None        Note:  This document was prepared using Dragon voice recognition software and may include unintentional dictation errors.   Delman Kitten, MD 11/30/22 1200

## 2022-11-30 NOTE — ED Notes (Signed)
Pt sleeping deeply. Does not rouse to voice. EDP at bedside again. Husband back at bedside.

## 2022-11-30 NOTE — Assessment & Plan Note (Signed)
SBPs 80s-90s on presentation  Appears to baseline  Will monitor for now  Hold offending medications

## 2022-11-30 NOTE — ED Notes (Signed)
Per Dr Jacqualine Code, give first 1000 bolus then reevaluate as pt also has CHF. Paused second 1L bag.

## 2022-11-30 NOTE — Assessment & Plan Note (Addendum)
On Vimpat chronically. Noted admission back in January for similar issues with some concern for psychogenic component Vimpat level pending Follow-up neurology recommendations

## 2022-11-30 NOTE — Assessment & Plan Note (Signed)
SSI  A1C  

## 2022-11-30 NOTE — Assessment & Plan Note (Signed)
Suspect toxic metabolic etiology in the setting of polypharmacy Noted worsening lethargy with?  Generalized shaking witnessed by the husband earlier in the day Recently started on Valium for left knee pain Also multiple narcotic medications include fentanyl, morphine and oxycodone as well as other sedating medications including Neurontin and Flexeril Mentation has slowly improved over the course of the day with with holding offending medications CT head stable Will check ammonia level Will hold offending medications including Valium, Neurontin, Flexeril Continue fentanyl patch for now Hold morphine and minimize oxycodone Seizure event less likely on the differential diagnosis given patient's use of Valium associated with the event Neurology aware of the case Will check a Vimpat level Follow-up neurology recommendations

## 2022-11-30 NOTE — ED Triage Notes (Signed)
To room 19 via [facility] from home with c/o seizure like activity. Per EMS, seizure like activity went on for apx 45 mins.  Pt non responsive to painful stimuli upon EMS arrival to home. Given 5mg  IntraMuscular Versed, and then became responsive to sternal rub in truck, and then became A&O. CBG 221.  Upon arrival to ED pt lethargic, barely arousal to voice and painful stimuli.  Airway self maintained, open and protected with no signs of impending compromise at current.,

## 2022-11-30 NOTE — Assessment & Plan Note (Signed)
CPAP.  

## 2022-11-30 NOTE — Assessment & Plan Note (Signed)
On Vimpat chronically. Noted admission back in January for similar issues with some concern for psychogenic component Vimpat level pending Formally consult neuro as clinically indicated.

## 2022-11-30 NOTE — Assessment & Plan Note (Signed)
Status post TKR 08/2022 with Dr. Marry Guan Noted persistent recurrent pain Pending formal orthopedic surgery evaluation

## 2022-11-30 NOTE — ED Notes (Signed)
Pt is waking up now. Dr Mortimer Fries at bedside examining pt. Was about to give Narcan but pt is awake now.

## 2022-11-30 NOTE — ED Notes (Signed)
Pt reports stomach cramping that has been going on for about an hour and has requested meds. MD made aware.

## 2022-11-30 NOTE — Assessment & Plan Note (Signed)
LLN BP on presentation  Hold BP regimen

## 2022-12-01 DIAGNOSIS — G9340 Encephalopathy, unspecified: Secondary | ICD-10-CM | POA: Diagnosis not present

## 2022-12-01 LAB — COMPREHENSIVE METABOLIC PANEL
ALT: 19 U/L (ref 0–44)
AST: 18 U/L (ref 15–41)
Albumin: 3.3 g/dL — ABNORMAL LOW (ref 3.5–5.0)
Alkaline Phosphatase: 86 U/L (ref 38–126)
Anion gap: 6 (ref 5–15)
BUN: 16 mg/dL (ref 6–20)
CO2: 29 mmol/L (ref 22–32)
Calcium: 7.4 mg/dL — ABNORMAL LOW (ref 8.9–10.3)
Chloride: 106 mmol/L (ref 98–111)
Creatinine, Ser: 0.83 mg/dL (ref 0.44–1.00)
GFR, Estimated: >60 mL/min (ref >60)
Glucose, Bld: 134 mg/dL — ABNORMAL HIGH (ref 70–99)
Potassium: 4 mmol/L (ref 3.5–5.1)
Sodium: 141 mmol/L (ref 135–145)
Total Bilirubin: 0.5 mg/dL (ref 0.3–1.2)
Total Protein: 6.3 g/dL — ABNORMAL LOW (ref 6.5–8.1)

## 2022-12-01 LAB — CBC
HCT: 34.1 % — ABNORMAL LOW (ref 36.0–46.0)
Hemoglobin: 10.9 g/dL — ABNORMAL LOW (ref 12.0–15.0)
MCH: 28.9 pg (ref 26.0–34.0)
MCHC: 32 g/dL (ref 30.0–36.0)
MCV: 90.5 fL (ref 80.0–100.0)
Platelets: 198 10*3/uL (ref 150–400)
RBC: 3.77 MIL/uL — ABNORMAL LOW (ref 3.87–5.11)
RDW: 14.4 % (ref 11.5–15.5)
WBC: 6.2 10*3/uL (ref 4.0–10.5)
nRBC: 0 % (ref 0.0–0.2)

## 2022-12-01 LAB — GLUCOSE, CAPILLARY: Glucose-Capillary: 134 mg/dL — ABNORMAL HIGH (ref 70–99)

## 2022-12-01 MED ORDER — FUROSEMIDE 40 MG PO TABS
40.0000 mg | ORAL_TABLET | Freq: Two times a day (BID) | ORAL | Status: DC
  Administered 2022-12-01: 40 mg via ORAL
  Filled 2022-12-01: qty 1

## 2022-12-01 MED ORDER — ESCITALOPRAM OXALATE 10 MG PO TABS
10.0000 mg | ORAL_TABLET | Freq: Every day | ORAL | Status: DC
  Administered 2022-12-01: 10 mg via ORAL
  Filled 2022-12-01: qty 1

## 2022-12-01 MED ORDER — ACETAMINOPHEN 500 MG PO TABS: 1000.0000 mg | ORAL_TABLET | Freq: Four times a day (QID) | ORAL | Status: DC | PRN

## 2022-12-01 MED ORDER — ALBUTEROL SULFATE (2.5 MG/3ML) 0.083% IN NEBU: 3.0000 mL | INHALATION_SOLUTION | Freq: Four times a day (QID) | RESPIRATORY_TRACT | Status: DC | PRN

## 2022-12-01 MED ORDER — SPIRONOLACTONE 25 MG PO TABS
50.0000 mg | ORAL_TABLET | Freq: Two times a day (BID) | ORAL | Status: DC
  Administered 2022-12-01: 50 mg via ORAL
  Filled 2022-12-01: qty 2

## 2022-12-01 MED ORDER — ALBUTEROL SULFATE HFA 108 (90 BASE) MCG/ACT IN AERS: 1.0000 | INHALATION_SPRAY | Freq: Four times a day (QID) | RESPIRATORY_TRACT | Status: DC | PRN

## 2022-12-01 MED ORDER — NITROGLYCERIN 0.4 MG SL SUBL: 0.4000 mg | SUBLINGUAL_TABLET | SUBLINGUAL | Status: DC | PRN

## 2022-12-01 MED ORDER — METHOCARBAMOL 500 MG PO TABS
500.0000 mg | ORAL_TABLET | Freq: Two times a day (BID) | ORAL | Status: DC
  Administered 2022-12-01: 500 mg via ORAL
  Filled 2022-12-01: qty 1

## 2022-12-01 MED ORDER — GABAPENTIN 300 MG PO CAPS: 300.0000 mg | ORAL_CAPSULE | Freq: Every day | ORAL | Status: DC

## 2022-12-01 MED ORDER — ROPINIROLE HCL 1 MG PO TABS: 4.0000 mg | ORAL_TABLET | Freq: Every day | ORAL | Status: DC

## 2022-12-01 MED ORDER — MORPHINE SULFATE 15 MG PO TABS
15.0000 mg | ORAL_TABLET | ORAL | Status: DC | PRN
  Administered 2022-12-01: 15 mg via ORAL
  Filled 2022-12-01: qty 1

## 2022-12-01 NOTE — Discharge Summary (Signed)
Physician Discharge Summary  Brittany Nguyen [id number] DOB: 08-26-1966 DOA: 11/30/2022  PCP: [name], MD  Admit date: 11/30/2022 Discharge date: 12/01/2022  Admitted From: Home Disposition:  Home  Recommendations for Outpatient Follow-up:  Follow up with PCP in 1-2 weeks Keep outpatient evaluation scheduled with neurology  Home Health: No Equipment/Devices: None  Discharge Condition: Stable CODE STATUS: Full Diet recommendation: Regular  Brief/Interim Summary:  57 y.o. female with medical history significant of multiple medical issues including QTc prolongation, hypertension, tightrope controlled diabetes, COPD/asthma, CAD, CHF with EF 45% status post AICD, hypothyroidism, GERD, depression, DVT status post IVC filter, carcinoid tumor of the stomach, chronic pain presenting with encephalopathy.  Patient noted to have been evaluated yesterday in the ER for left knee pain.  Patient noted had a left total knee replacement back in December.  Has had worsening pain as well as paresthesias around the knee.  Was evaluated by the ER physician and placed on a course of Valium.  Per report, patient was found to be lethargic by the husband with some generalized shaking.  Patient unclear of any true loss of consciousness.  Patient reports similar episode approximately 3 to 4 months ago to which patient was admitted to the hospital for similar issues with formal diagnosis of concern for possible psychogenic component of seizures.  No fevers or chills.  No head trauma.  No reported fall.  Noted to be on multiple sedating medications including fentanyl, morphine, Percocet, Neurontin, Flexeril.  Patient states symptoms started after taking Valium.   Valium was held.  No further seizure-like activity noted.  Doubt true breakthrough seizures.  Patient back to baseline on day of discharge.  Ambulating around room without difficulty.  Will resume home medication regimen.  Relayed to patient that pain  medication regimen is quite extensive which she is aware.  She is established with pain management physicians at [facility].  No changes made home medication regimen.  Discontinue diazepam.  Stable for discharge home.  Seen by physical therapy, no follow-up or needs recommended.    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  Valium recently added and it appears that symptoms have worsened after addition of Valium.  I doubt true seizure activity.  Likely related to long-acting benzodiazepine in the setting of substantial pain regimen.  Will discontinue diazepam at time of discharge.  No other changes made home medication regimen.  Mental status back to baseline.  Patient ambulating around the room without difficulty.  Can discharge home with outpatient physical therapy to be resumed.    History of seizure disorder On Vimpat chronically. Noted admission back in January for similar issues with some concern for psychogenic component.  No changes made home medication regimen.  Outpatient follow-up with neurology suggested.    Discharge Instructions  Discharge Instructions     Diet - low sodium heart healthy   Complete by: As directed    Increase activity slowly   Complete by: As directed       Allergies as of 12/01/2022  Reactions   Contrast Media [iodinated Contrast Media] Shortness Of Breath   Per CT report in 2014 pt had break through contrast reaction with hives and SOB following 13 hour prep. MSY    Ferumoxytol Nausea  Only   Iron Sucrose Anaphylaxis   Lidocaine Hives   Metrizamide Shortness Of Breath   Penicillins Hives, Other (See Comments)   IgE = 4 (WNL) on 08/12/2022 ediate rash, facial/tongue/throat swelling, SOB or lightheadedness with hypotension, tachy   Sacubitril-valsartan    Sz (unclear if new start entresto vs hypoglycemia)   Isosorbide Nitrate Other (See Comments)   Headache   Latex Hives   Ondansetron Other (See Comments)   Severe headache   Tizanidine Other (See Comments)   Feels altered   Povidone-iodine Rash   Blistering rash   Pulmicort [budesonide] Itching        Medication List     STOP taking these medications    azithromycin 250 MG tablet Commonly known as: ZITHROMAX   calcium-vitamin D 500-5 MG-MCG tablet Commonly known as: OSCAL WITH D   diazepam 2 MG tablet Commonly known as: Valium   magnesium oxide 400 MG tablet Commonly known as: MAG-OX   Melatonin 3 MG Caps       TAKE these medications    acetaminophen 500 MG tablet Commonly known as: TYLENOL Take 2 tablets (1,000 mg total) by mouth every 6 (six) hours as needed.   albuterol 108 (90 Base) MCG/ACT inhaler Commonly known as: VENTOLIN HFA Inhale 1-2 puffs into the lungs every 6 (six) hours as needed for wheezing or shortness of breath.   albuterol 1.25 MG/3ML nebulizer solution Commonly known as: ACCUNEB Take 1 ampule by nebulization every 6 (six) hours as needed for wheezing.   beclomethasone 80 MCG/ACT inhaler Commonly known as: QVAR Inhale 2 puffs into the lungs 2 (two) times daily as needed (SOB and wheezing).   cyclobenzaprine 5 MG tablet Commonly known as: FLEXERIL Take 5 mg by mouth 4 (four) times daily as needed.   escitalopram 10 MG tablet Commonly known as: LEXAPRO Take 10 mg by mouth daily. at 9 AM   fentaNYL 75 MCG/HR Commonly known as: [place] 1 patch onto the skin every 3 (three) days.   furosemide 40 MG tablet Commonly known as: LASIX Take 40 mg by mouth 2  (two) times daily.   gabapentin 300 MG capsule Commonly known as: NEURONTIN Take 1 capsule (300 mg total) by mouth at bedtime. What changed: Another medication with the same name was removed. Continue taking this medication, and follow the directions you see here.   Lacosamide 100 MG Tabs Take 1 tablet by mouth 2 (two) times daily.   levothyroxine 175 MCG tablet Commonly known as: SYNTHROID Take 175 mcg by mouth every morning.   LORazepam 1 MG tablet Commonly known as: ATIVAN Take 1 tablet (1 mg total) by mouth 4 (four) times daily as needed.   losartan 25 MG tablet Commonly known as: COZAAR Take 25 mg by mouth daily.   methocarbamol 500 MG tablet Commonly known as: ROBAXIN Take 500 mg by mouth 2 (two) times daily.   metoprolol succinate 50 MG 24 hr tablet Commonly known as: TOPROL-XL Take 50 mg by mouth 2 (two) times daily.   morphine 15 MG tablet Commonly known as: MSIR Take 15 mg by mouth every 4 (four) hours as needed.   naloxone 4 MG/0.1ML Liqd nasal spray kit Commonly known as: NARCAN Place 1 spray into the nose as needed for opioid  reversal. May repeat once in opposite nare.   nitroGLYCERIN 0.4 MG SL tablet Commonly known as: NITROSTAT Place 0.4 mg under the tongue every 5 (five) minutes x 3 doses as needed for chest pain.   oxyCODONE-acetaminophen 5-325 MG tablet Commonly known as: PERCOCET/ROXICET Take 1-2 tablets by mouth every 4 (four) hours as needed (breakthrough pain).   Potassium Chloride ER 20 MEQ Tbcr Take 1 tablet by mouth in the morning and at bedtime.   promethazine 25 MG tablet Commonly known as: PHENERGAN Take 25 mg by mouth every 4 (four) hours as needed for nausea/vomiting.   rOPINIRole 4 MG tablet Commonly known as: REQUIP Take 4 mg by mouth at bedtime.   spironolactone 50 MG tablet Commonly known as: ALDACTONE Take 50 mg by mouth 2 (two) times daily.        Allergies  Allergen Reactions   Contrast Media [Iodinated Contrast  Media] Shortness Of Breath    Per CT report in 2014 pt had break through contrast reaction with hives and SOB following 13 hour prep. MSY    Ferumoxytol Nausea Only   Iron Sucrose Anaphylaxis   Lidocaine Hives   Metrizamide Shortness Of Breath   Penicillins Hives and Other (See Comments)    IgE = 4 (WNL) on 08/12/2022  ediate rash, facial/tongue/throat swelling, SOB or lightheadedness with hypotension, tachy   Sacubitril-Valsartan     Sz (unclear if new start entresto vs hypoglycemia)   Isosorbide Nitrate Other (See Comments)    Headache   Latex Hives   Ondansetron Other (See Comments)    Severe headache   Tizanidine Other (See Comments)    Feels altered   Povidone-Iodine Rash    Blistering rash   Pulmicort [Budesonide] Itching    Consultations: Orthopedics   Procedures/Studies: DG Knee Left Port  Result Date: 11/30/2022 CLINICAL DATA:  Previous left knee arthroplasty EXAM: PORTABLE LEFT KNEE - 1-2 VIEW COMPARISON:  08/24/2022 FINDINGS: There is previous left knee arthroplasty. No recent fracture or dislocation is seen. There is some soft tissue fullness in suprapatellar bursa. IMPRESSION: Status post left knee arthroplasty. No recent fracture or dislocation is seen. Possible small effusion in suprapatellar bursa. Electronically Signed   By: Elmer Picker M.D.   On: 11/30/2022 19:51   DG Chest Port 1 View  Result Date: 11/30/2022 CLINICAL DATA:  Arms trembling and shallow breathing. History of seizures. Somnolence. EXAM: PORTABLE CHEST 1 VIEW COMPARISON:  Chest radiograph 10/11/2022. FINDINGS: The left chest wall cardiac device and associated leads are stable. The cardiac silhouette is enlarged, likely exaggerated by AP technique but increased from prior. The upper mediastinal contours are within normal limits. Lung volumes are low. There is vascular congestion and possible mild pulmonary interstitial edema. There is no focal airspace opacity. There is no significant pleural  effusion. There is no pneumothorax There is no acute osseous abnormality. IMPRESSION: 1. Enlarged cardiac silhouette which appears increased from prior. This may be exaggerated due to AP technique; however, a pericardial effusion is not excluded. 2. Vascular congestion and possible mild pulmonary interstitial edema. Electronically Signed   By: Valetta Mole M.D.   On: 11/30/2022 09:04   CT Head Wo Contrast  Result Date: 11/30/2022 CLINICAL DATA:  Mental status change, persistent or worsening. Seizure-like activity. EXAM: CT HEAD WITHOUT CONTRAST TECHNIQUE: Contiguous axial images were obtained from the base of the skull through the vertex without intravenous contrast. RADIATION DOSE REDUCTION: This exam was performed according to the departmental dose-optimization program which includes automated exposure control,  adjustment of the mA and/or kV according to patient size and/or use of iterative reconstruction technique. COMPARISON:  Head CT 09/19/2022 FINDINGS: Brain: There is no evidence of an acute infarct, intracranial hemorrhage, mass, midline shift, or extra-axial fluid collection. The ventricles and sulci are normal. Vascular: No hyperdense vessel. Skull: No acute fracture or suspicious osseous lesion. Sinuses/Orbits: Prior functional endoscopic sinus surgery without evidence of acute inflammatory sinus disease. Unchanged left frontoethmoidal sinus osteoma. Trace left mastoid effusion. Unremarkable orbits. Other: None. IMPRESSION: Unremarkable CT appearance of the brain. Electronically Signed   By: Logan Bores M.D.   On: 11/30/2022 08:39      Subjective: Seen and examined on the day of discharge.  Back to baseline.  Ambulating around the room without difficulty.  Appropriate for discharge home.    Discharge Exam: Vitals:   12/01/22 0509 12/01/22 0900  BP: 122/81 126/78  Pulse: 75 79  Resp: 20 18  Temp: 98.2 F (36.8 C) 98.2 F (36.8 C)  SpO2: 98% 95%   Vitals:   11/30/22 1730 11/30/22  2101 12/01/22 0509 12/01/22 0900  BP: (!) 125/92 117/77 122/81 126/78  Pulse: 71 75 75 79  Resp: 16 20 20 18   Temp: 98 F (36.7 C) 98 F (36.7 C) 98.2 F (36.8 C) 98.2 F (36.8 C)  TempSrc: Oral Oral Oral Oral  SpO2: 100% 96% 98% 95%  Weight:      Height:        General: Pt is alert, awake, not in acute distress Cardiovascular: RRR, S1/S2 +, no rubs, no gallops Respiratory: CTA bilaterally, no wheezing, no rhonchi Abdominal: Soft, NT, ND, bowel sounds + Extremities: no edema, no cyanosis    The results of significant diagnostics from this hospitalization (including imaging, microbiology, ancillary and laboratory) are listed below for reference.     Microbiology: No results found for this or any previous visit (from the past 240 hour(s)).   Labs: BNP (last 3 results) Recent Labs    09/11/22 1022 09/19/22 0548  BNP 16.8 0000000   Basic Metabolic Panel: Recent Labs  Lab 11/30/22 0647 12/01/22 0513  NA 140 141  K 3.7 4.0  CL 101 106  CO2 31 29  GLUCOSE 118* 134*  BUN 16 16  CREATININE 1.18* 0.83  CALCIUM 8.4* 7.4*   Liver Function Tests: Recent Labs  Lab 11/30/22 0647 12/01/22 0513  AST 32 18  ALT 24 19  ALKPHOS 97 86  BILITOT 0.4 0.5  PROT 7.7 6.3*  ALBUMIN 4.0 3.3*   No results for input(s): "LIPASE", "AMYLASE" in the last 168 hours. No results for input(s): "AMMONIA" in the last 168 hours. CBC: Recent Labs  Lab 11/30/22 0647 12/01/22 0513  WBC 9.7 6.2  NEUTROABS 6.3  --   HGB 11.8* 10.9*  HCT 37.3 34.1*  MCV 89.9 90.5  PLT 245 198   Cardiac Enzymes: No results for input(s): "CKTOTAL", "CKMB", "CKMBINDEX", "TROPONINI" in the last 168 hours. BNP: Invalid input(s): "POCBNP" CBG: Recent Labs  Lab 11/30/22 0718 11/30/22 0855 11/30/22 1751 11/30/22 2102 12/01/22 0823  GLUCAP 104* 113* 99 131* 134*   D-Dimer No results for input(s): "DDIMER" in the last 72 hours. Hgb A1c Recent Labs    11/30/22 1323  HGBA1C 6.7*   Lipid  Profile No results for input(s): "CHOL", "HDL", "LDLCALC", "TRIG", "CHOLHDL", "LDLDIRECT" in the last 72 hours. Thyroid function studies No results for input(s): "TSH", "T4TOTAL", "T3FREE", "THYROIDAB" in the last 72 hours.  Invalid input(s): "FREET3" Anemia work up No  results for input(s): "VITAMINB12", "FOLATE", "FERRITIN", "TIBC", "IRON", "RETICCTPCT" in the last 72 hours. Urinalysis    Component Value Date/Time   COLORURINE YELLOW (A) 09/19/2022 1258   APPEARANCEUR CLEAR (A) 09/19/2022 1258   APPEARANCEUR Clear 09/02/2014 2312   LABSPEC 1.012 09/19/2022 1258   LABSPEC 1.013 09/02/2014 2312   PHURINE 5.0 09/19/2022 1258   GLUCOSEU NEGATIVE 09/19/2022 1258   GLUCOSEU Negative 09/02/2014 2312   HGBUR SMALL (A) 09/19/2022 1258   BILIRUBINUR NEGATIVE 09/19/2022 1258   BILIRUBINUR Negative 09/02/2014 2312   KETONESUR NEGATIVE 09/19/2022 1258   PROTEINUR NEGATIVE 09/19/2022 1258   NITRITE NEGATIVE 09/19/2022 1258   LEUKOCYTESUR TRACE (A) 09/19/2022 1258   LEUKOCYTESUR Negative 09/02/2014 2312   Sepsis Labs Recent Labs  Lab 11/30/22 0647 12/01/22 0513  WBC 9.7 6.2   Microbiology No results found for this or any previous visit (from the past 240 hour(s)).   Time coordinating discharge: Over 30 minutes  SIGNED:   Sidney Ace, MD  [facility] 12/01/2022, 11:22 AM Pager   If 7PM-7AM, please contact night-coverage

## 2022-12-01 NOTE — Progress Notes (Signed)
PT Cancellation Note  Patient Details Name: Brittany Nguyen MRN: [id number] DOB: June 02, 1966   Cancelled Treatment:    Reason Eval/Treat Not Completed: PT screened, no needs identified, will sign off. Patient ambulating independently in room. Reports no needs at this time. Should discharge home today.  Signing off.    Brittany Nguyen 12/01/2022, 8:56 AM

## 2022-12-01 NOTE — Consult Note (Signed)
ORTHOPAEDIC CONSULTATION  PATIENT NAME: Brittany Nguyen DOB: 17-May-1966  MRN: [id number]  REQUESTING PHYSICIAN: Sidney Ace, MD  Chief Complaint: Left leg pain  HPI: Brittany Nguyen is a 57 y.o. female who presented to the emergency department yesterday via EMS with concerns of possible seizure activity.  She had presented to the emergency department the previous day due to left leg pain and was prescribed Valium.  The patient is over 3 months status post left total knee arthroplasty.  Her postoperative course was unremarkable and she completed a 6-week course of physical therapy.  She reports the onset of "cramping" and spasms in the posterior aspect of the left leg with some similar but lesser symptoms in the right leg.  Previous lower extremity ultrasound was negative for DVT.  She had been treated with muscle relaxers with minimal success.  Initial workup was unremarkable and she was referred to Neurology for further evaluation.  Neurology increased her gabapentin and scheduled lower extremity nerve conduction studies.  Those studies are pending at this time.  The patient states that the symptoms are most pronounced posteriorly just above and below the level of the left knee.  The symptoms tend to be worse at nighttime and interfere with her sleep patterns.  She also reports significant discomfort while sitting.  Standing or ambulation actually improved the symptoms.  She does have a history of restless leg syndrome, although she states the symptoms are somewhat different.  She denies any numbness, weakness, or causalgia.  She denies any bowel or bladder dysfunction.  She denies any significant swelling to the left knee.  She admits that she has not been as conscientious with her knee exercises.  Past Medical History:  Diagnosis Date   Acute anterolateral wall MI (Brittany Nguyen) 08/2016   AICD (automatic cardioverter/defibrillator) present 03/29/2017   a.) Medtronic Evera MRI XT DR  SureScan (SN: [id number])   Anxiety    Arthritis    Asthma 2013   C. 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   a.) Bx 02/22/2018 --> NE tumor cells --> AE1/AE3, chromogranin, synaptophysin (+); Ki-67 proliferation rate <1%; b.) repeat Bx 08/24/2018 --> well differentiard NET (G1)   Chronic, continuous use of opioids    a.) has naloxone Rx available   COPD (chronic obstructive pulmonary disease) ([place])    Depression    Diet-controlled type 2 diabetes mellitus ([place])    Diverticulitis 2010   DVT (deep venous thrombosis) ([place])    Endometriosis 1990   Generalized epilepsy (Brittany Nguyen)    a.) on lacosamide; b.) last seizure 07-30-22 in setting of hypokalemia (K+ 2.6)   GERD (gastroesophageal reflux disease)    GIB (gastrointestinal bleeding) 01/08/2018   HFrEF (heart failure with reduced ejection fraction) ([place])    a.)TTE 12/09/16: EF 25-30%, dif HK; b.) TTE 02/08/17: EF 25%, dif HK, mild TR, G1DD; c.) TTE 04/25/17: EF 30-35%, mild LVH; d.) TTE 01/30/18: EF 45%, MAC, LAE, mild MR, G1DD; e.) TTE 11/06/2018: EF 25%, sev glob HK, triv MR/TR/PR; f.) TTE 05/12/19: EF 35%, MAC, AoV sclerosis, G1DD; g.) TTE 11/05/19: EF 20%, sev glob HK, triv MR; h.) TTE 06/17/20: EF 40%, mod glob HK, triv MR; I:) TTE 06/14/22: EF 25-30%, G1DD   History of cardiac catheterization    a.) [facility] 10/21/2014: normal coronaries; b.) [facility] 02/24/2017: normal coronaries, mRA 12, mPA 28, mPCWP 15, CO 8.0, CI 3.8   History of kidney stones    Hx MRSA infection  Hypokalemia    Hypothyroidism    Iron deficiency anemia 03/27/2018   Lump or mass in breast 11/27/2012   a.) Bx 11/27/2012 --> apocrine wall cyst   Migraine    Nausea & vomiting    Neuropathy    Non-ischemic cardiomyopathy (Brittany Nguyen)    a.) TTE 12/09/2016: EF 25-30%, b.) TTE 02/08/2017: EF 25%; c.) Medtronic AICD placed 03/29/2017; d.) TTE 04/25/2017: EF 30-35%; e.) TTE 01/30/2018: EF 45%; f.) TTE 11/06/2018: EF 25%; g.) TTE 05/12/2019: EF  35%; h.) TTE 11/05/2019: EF 20%; I.) TTE 06/17/2020: EF 40%; J.) TTE 06/14/2022: EF 25-30%   Obesity    OSA on CPAP    PAF (paroxysmal atrial fibrillation) (H[facility])    PONV (postoperative nausea and vomiting)    Post-COVID chronic cough    Postcholecystectomy diarrhea    Prolonged Q-T interval on ECG    Restless leg    a.) on ropinirole   Unstable angina ([place])    Past Surgical History:  Procedure Laterality Date   ABDOMINAL HYSTERECTOMY     age 38   BREAST BIOPSY Right 2014   benign   CARDIAC DEFIBRILLATOR PLACEMENT Left 03/29/2017   Procedure: [place] (AICD); Location: [facility]; Surgeon: Burnett Sheng, MD   CESAREAN SECTION     CHOLECYSTECTOMY     COLECTOMY     COLONOSCOPY WITH PROPOFOL N/A 02/22/2018   Procedure: COLONOSCOPY WITH PROPOFOL;  Surgeon: Toledo, Benay Pike, MD;  Location: [facility];  Service: Gastroenterology;  Laterality: N/A;   ESOPHAGOGASTRODUODENOSCOPY (EGD) WITH PROPOFOL N/A 02/22/2018   Procedure: ESOPHAGOGASTRODUODENOSCOPY (EGD) WITH PROPOFOL;  Surgeon: Toledo, Benay Pike, MD;  Location: [facility];  Service: Gastroenterology;  Laterality: N/A;   IVC FILTER INSERTION N/A 08/23/2022   Procedure: IVC FILTER INSERTION;  Surgeon: Katha Cabal, MD;  Location: [facility];  Service: Cardiovascular;  Laterality: N/A;   KNEE ARTHROPLASTY Left 08/24/2022   Procedure: COMPUTER ASSISTED TOTAL KNEE ARTHROPLASTY;  Surgeon: Dereck Leep, MD;  Location: [facility];  Service: Orthopedics;  Laterality: Left;   LEFT HEART CATH AND CORONARY ANGIOGRAPHY Left 10/21/2014   Procedure: LEFT HEART CATH AND CORONARY ANGIOGRAPHY; Location: [facility]; Surgeon: Serafina Royals, MD   NASAL SINUS SURGERY  2012   OOPHORECTOMY     RIGHT/LEFT HEART CATH AND CORONARY ANGIOGRAPHY Bilateral 02/24/2017   Procedure: RIGHT/LEFT HEART CATH AND CORONARY ANGIOGRAPHY; Location: [facility]   THYROIDECTOMY     Social History   Socioeconomic History   Marital  status: Married    Spouse name: Not on file   Number of children: Not on file   Years of education: Not on file   Highest education level: Not on file  Occupational History   Occupation: works at [employer].  Tobacco Use   Smoking status: Never   Smokeless tobacco: Never  Vaping Use   Vaping Use: Never used  Substance and Sexual Activity   Alcohol use: No   Drug use: Never   Sexual activity: Yes    Birth control/protection: Post-menopausal  Other Topics Concern   Not on file  Social History Narrative   Not on file   Social Determinants of Health   Financial Resource Strain: Not on file  Food Insecurity: No Food Insecurity (11/30/2022)   Hunger Vital Sign    Worried About Running Out of Food in the Last Year: Never true    Ran Out of Food in the Last Year: Never true  Transportation Needs: No Transportation Needs (11/30/2022)   PRAPARE - Transportation  Lack of Transportation (Medical): No    Lack of Transportation (Non-Medical): No  Physical Activity: Not on file  Stress: Not on file  Social Connections: Not on file   Family History  Problem Relation Age of Onset   Cancer Mother 63       ovarian   CAD Mother    Cancer Father 9       brain   CAD Father    Cancer Daughter 86       skin   Cancer Maternal Aunt 34       breast   Leukemia Paternal Grandfather    Allergies  Allergen Reactions   Contrast Media [Iodinated Contrast Media] Shortness Of Breath    Per CT report in 2014 pt had break through contrast reaction with hives and SOB following 13 hour prep. MSY    Ferumoxytol Nausea Only   Iron Sucrose Anaphylaxis   Lidocaine Hives   Metrizamide Shortness Of Breath   Penicillins Hives and Other (See Comments)    IgE = 4 (WNL) on 08/12/2022  ediate rash, facial/tongue/throat swelling, SOB or lightheadedness with hypotension, tachy   Sacubitril-Valsartan     Sz (unclear if new start entresto vs hypoglycemia)   Isosorbide Nitrate Other (See Comments)     Headache   Latex Hives   Ondansetron Other (See Comments)    Severe headache   Tizanidine Other (See Comments)    Feels altered   Povidone-Iodine Rash    Blistering rash   Pulmicort [Budesonide] Itching   Prior to Admission medications   Medication Sig Start Date End Date Taking? Authorizing Provider  acetaminophen (TYLENOL) 500 MG tablet Take 2 tablets (1,000 mg total) by mouth every 6 (six) hours as needed. 09/11/22 09/11/23 Yes Teodoro Spray, PA  albuterol (ACCUNEB) 1.25 MG/3ML nebulizer solution Take 1 ampule by nebulization every 6 (six) hours as needed for wheezing.   Yes [provider]  albuterol (VENTOLIN HFA) 108 (90 Base) MCG/ACT inhaler Inhale 1-2 puffs into the lungs every 6 (six) hours as needed for wheezing or shortness of breath.   Yes [provider]  cyclobenzaprine (FLEXERIL) 5 MG tablet Take 5 mg by mouth 4 (four) times daily as needed. 08/31/22  Yes [provider]  diazepam (VALIUM) 2 MG tablet Take 1 tablet (2 mg total) by mouth every 8 (eight) hours as needed for muscle spasms. 11/29/22  Yes Paulette Blanch, MD  escitalopram (LEXAPRO) 10 MG tablet Take 10 mg by mouth daily. at 9 AM 11/24/22  Yes [provider]  fentaNYL (DURAGESIC) 75 MCG/HR Place 1 patch onto the skin every 3 (three) days. 01/12/22  Yes [provider]  furosemide (LASIX) 40 MG tablet Take 40 mg by mouth 2 (two) times daily. 02/17/22  Yes [provider]  gabapentin (NEURONTIN) 300 MG capsule Take 1 capsule (300 mg total) by mouth at bedtime. 09/20/22  Yes Emeterio Reeve, DO  Lacosamide 100 MG TABS Take 1 tablet by mouth 2 (two) times daily.   Yes [provider]  levothyroxine (SYNTHROID) 175 MCG tablet Take 175 mcg by mouth every morning. 12/17/21  Yes [provider]  LORazepam (ATIVAN) 1 MG tablet Take 1 tablet (1 mg total) by mouth 4 (four) times daily as needed. 11/10/21  Yes Lorella Nimrod, MD  losartan (COZAAR) 25 MG tablet  Take 25 mg by mouth daily. 11/15/22  Yes [provider]  methocarbamol (ROBAXIN) 500 MG tablet Take 500 mg by mouth 2 (two) times daily.  10/14/22  Yes [provider]  metoprolol succinate (TOPROL-XL) 50 MG 24 hr tablet Take 50 mg by mouth 2 (two) times daily. 02/16/22  Yes [provider]  morphine (MSIR) 15 MG tablet Take 15 mg by mouth every 4 (four) hours as needed. 02/17/22  Yes [provider]  naloxone (NARCAN) nasal spray 4 mg/0.1 mL Place 1 spray into the nose as needed for opioid reversal. May repeat once in opposite nare. 10/05/21  Yes [provider]  nitroGLYCERIN (NITROSTAT) 0.4 MG SL tablet Place 0.4 mg under the tongue every 5 (five) minutes x 3 doses as needed for chest pain. 11/23/21  Yes [provider]  oxyCODONE-acetaminophen (PERCOCET/ROXICET) 5-325 MG tablet Take 1-2 tablets by mouth every 4 (four) hours as needed (breakthrough pain). 01/17/20  Yes [provider]  Potassium Chloride ER 20 MEQ TBCR Take 1 tablet by mouth in the morning and at bedtime. 09/20/22  Yes Emeterio Reeve, DO  promethazine (PHENERGAN) 25 MG tablet Take 25 mg by mouth every 4 (four) hours as needed for nausea/vomiting. 12/13/21  Yes [provider]  rOPINIRole (REQUIP) 4 MG tablet Take 4 mg by mouth at bedtime. 01/30/19  Yes [provider]  spironolactone (ALDACTONE) 50 MG tablet Take 50 mg by mouth 2 (two) times daily. 11/09/22  Yes [provider]  azithromycin (ZITHROMAX) 250 MG tablet Take 250 mg by mouth as directed. Patient not taking: Reported on 11/30/2022 11/24/22   [provider]  beclomethasone (QVAR) 80 MCG/ACT inhaler Inhale 2 puffs into the lungs 2 (two) times daily as needed (SOB and wheezing).    [provider]  calcium-vitamin D (OSCAL WITH D) 500-5 MG-MCG tablet Take 1 tablet by mouth daily with breakfast. Patient not taking: Reported on 11/30/2022 09/20/22   Emeterio Reeve, DO   gabapentin (NEURONTIN) 100 MG capsule Take 100 mg by mouth at bedtime. Patient not taking: Reported on 11/30/2022 10/21/22   [provider]  magnesium oxide (MAG-OX) 400 MG tablet Take 1 tablet (400 mg total) by mouth daily. Patient not taking: Reported on 11/30/2022 09/20/22   Emeterio Reeve, DO  Melatonin 3 MG CAPS Take 1-2 capsules (3-6 mg total) by mouth at bedtime. Patient not taking: Reported on 11/30/2022 09/20/22   Emeterio Reeve, DO   DG Knee Left Port  Result Date: 11/30/2022 CLINICAL DATA:  Previous left knee arthroplasty EXAM: PORTABLE LEFT KNEE - 1-2 VIEW COMPARISON:  08/24/2022 FINDINGS: There is previous left knee arthroplasty. No recent fracture or dislocation is seen. There is some soft tissue fullness in suprapatellar bursa. IMPRESSION: Status post left knee arthroplasty. No recent fracture or dislocation is seen. Possible small effusion in suprapatellar bursa. Electronically Signed   By: Elmer Picker M.D.   On: 11/30/2022 19:51   DG Chest Port 1 View  Result Date: 11/30/2022 CLINICAL DATA:  Arms trembling and shallow breathing. History of seizures. Somnolence. EXAM: PORTABLE CHEST 1 VIEW COMPARISON:  Chest radiograph 10/11/2022. FINDINGS: The left chest wall cardiac device and associated leads are stable. The cardiac silhouette is enlarged, likely exaggerated by AP technique but increased from prior. The upper mediastinal contours are within normal limits. Lung volumes are low. There is vascular congestion and possible mild pulmonary interstitial edema. There is no focal airspace opacity. There is no significant pleural effusion. There is no pneumothorax There is no acute osseous abnormality. IMPRESSION: 1. Enlarged cardiac silhouette which appears increased from prior. This may be exaggerated due to AP technique; however, a pericardial effusion is  not excluded. 2. Vascular congestion and possible mild pulmonary interstitial edema. Electronically Signed   By:  Valetta Mole M.D.   On: 11/30/2022 09:04   CT Head Wo Contrast  Result Date: 11/30/2022 CLINICAL DATA:  Mental status change, persistent or worsening. Seizure-like activity. EXAM: CT HEAD WITHOUT CONTRAST TECHNIQUE: Contiguous axial images were obtained from the base of the skull through the vertex without intravenous contrast. RADIATION DOSE REDUCTION: This exam was performed according to the departmental dose-optimization program which includes automated exposure control, adjustment of the mA and/or kV according to patient size and/or use of iterative reconstruction technique. COMPARISON:  Head CT 09/19/2022 FINDINGS: Brain: There is no evidence of an acute infarct, intracranial hemorrhage, mass, midline shift, or extra-axial fluid collection. The ventricles and sulci are normal. Vascular: No hyperdense vessel. Skull: No acute fracture or suspicious osseous lesion. Sinuses/Orbits: Prior functional endoscopic sinus surgery without evidence of acute inflammatory sinus disease. Unchanged left frontoethmoidal sinus osteoma. Trace left mastoid effusion. Unremarkable orbits. Other: None. IMPRESSION: Unremarkable CT appearance of the brain. Electronically Signed   By: Logan Bores M.D.   On: 11/30/2022 08:39    Positive ROS: All other systems have been reviewed and were otherwise negative with the exception of those mentioned in the HPI and as above.  Physical Exam: General: Well developed, well nourished female seen in no acute distress. HEENT: Atraumatic and normocephalic. Sclera are clear. Extraocular motion is intact. Oropharynx is clear with moist mucosa. Neck: Supple, nontender, good range of motion.  Lungs: Normal respiratory effort.  No audible wheezing. Cardiovascular:  Pedal pulses are palpable bilaterally. Homans test is negative bilaterally. No significant pretibial or ankle edema. Skin: No lesions in the area of chief complaint Neurologic: Awake, alert, and oriented. Sensory function is  grossly intact. Motor strength is felt to be 5 over 5 bilaterally. No clonus or tremor. Good motor coordination.  MUSCULOSKELETAL: Examination of the left knee and leg shows the previous anterior surgical incision to be well-healed.  There is no erythema or significant increased warmth.  No skin lesions are appreciated.  No knee effusion.  Knee is stable to varus and valgus stress.  Fair to good quadriceps tone is appreciated.  Greater than 110 degrees of flexion is noted.  No palpable popliteal cyst or swelling.  There is no focal tenderness to palpation about the knee.  Left knee radiographs: I reviewed the left knee radiographs obtained at [facility] and compared with the 6-week postoperative films.  No interval changes appreciated.  Total knee implants are in good condition.  No evidence of loosening or fracture.  Assessment: Status post left total knee arthroplasty Possible neurogenic left leg pain  Plan: The findings were discussed in detail with the patient.  She was reassured about the physical and radiographic findings regarding the left knee.  I explained that the symptoms are more suggestive of a neurogenic origin, especially given that she was having similar symptoms to the contralateral leg.  We will await the EMG/nerve conduction studies as per Neurology.  We may consider restarting physical therapy, as she was not having any symptoms while she was actively participating in her postoperative rehab protocol.  Dhruvan Gullion P. Holley Bouche M.D.

## 2022-12-03 LAB — LACOSAMIDE: Lacosamide: 13.8 ug/mL — ABNORMAL HIGH (ref 5.0–10.0)

## 2022-12-12 ENCOUNTER — Other Ambulatory Visit: Payer: Self-pay | Admitting: Family Medicine

## 2022-12-12 ENCOUNTER — Encounter: Payer: Self-pay | Admitting: Oncology

## 2022-12-12 DIAGNOSIS — Z1231 Encounter for screening mammogram for malignant neoplasm of breast: Secondary | ICD-10-CM

## 2022-12-13 IMAGING — CR DG CHEST 2V
1 series · 2 of 2 positions shown · non-contrast
Comparison: June 21, 2021.

CLINICAL DATA: Cough, fever in a 55-year-old female.

EXAM:
CHEST - 2 VIEW

[Series 1: dg chest 2 view · 0.14mm/px · 2 of 2 slices shown]
[im 1/2]
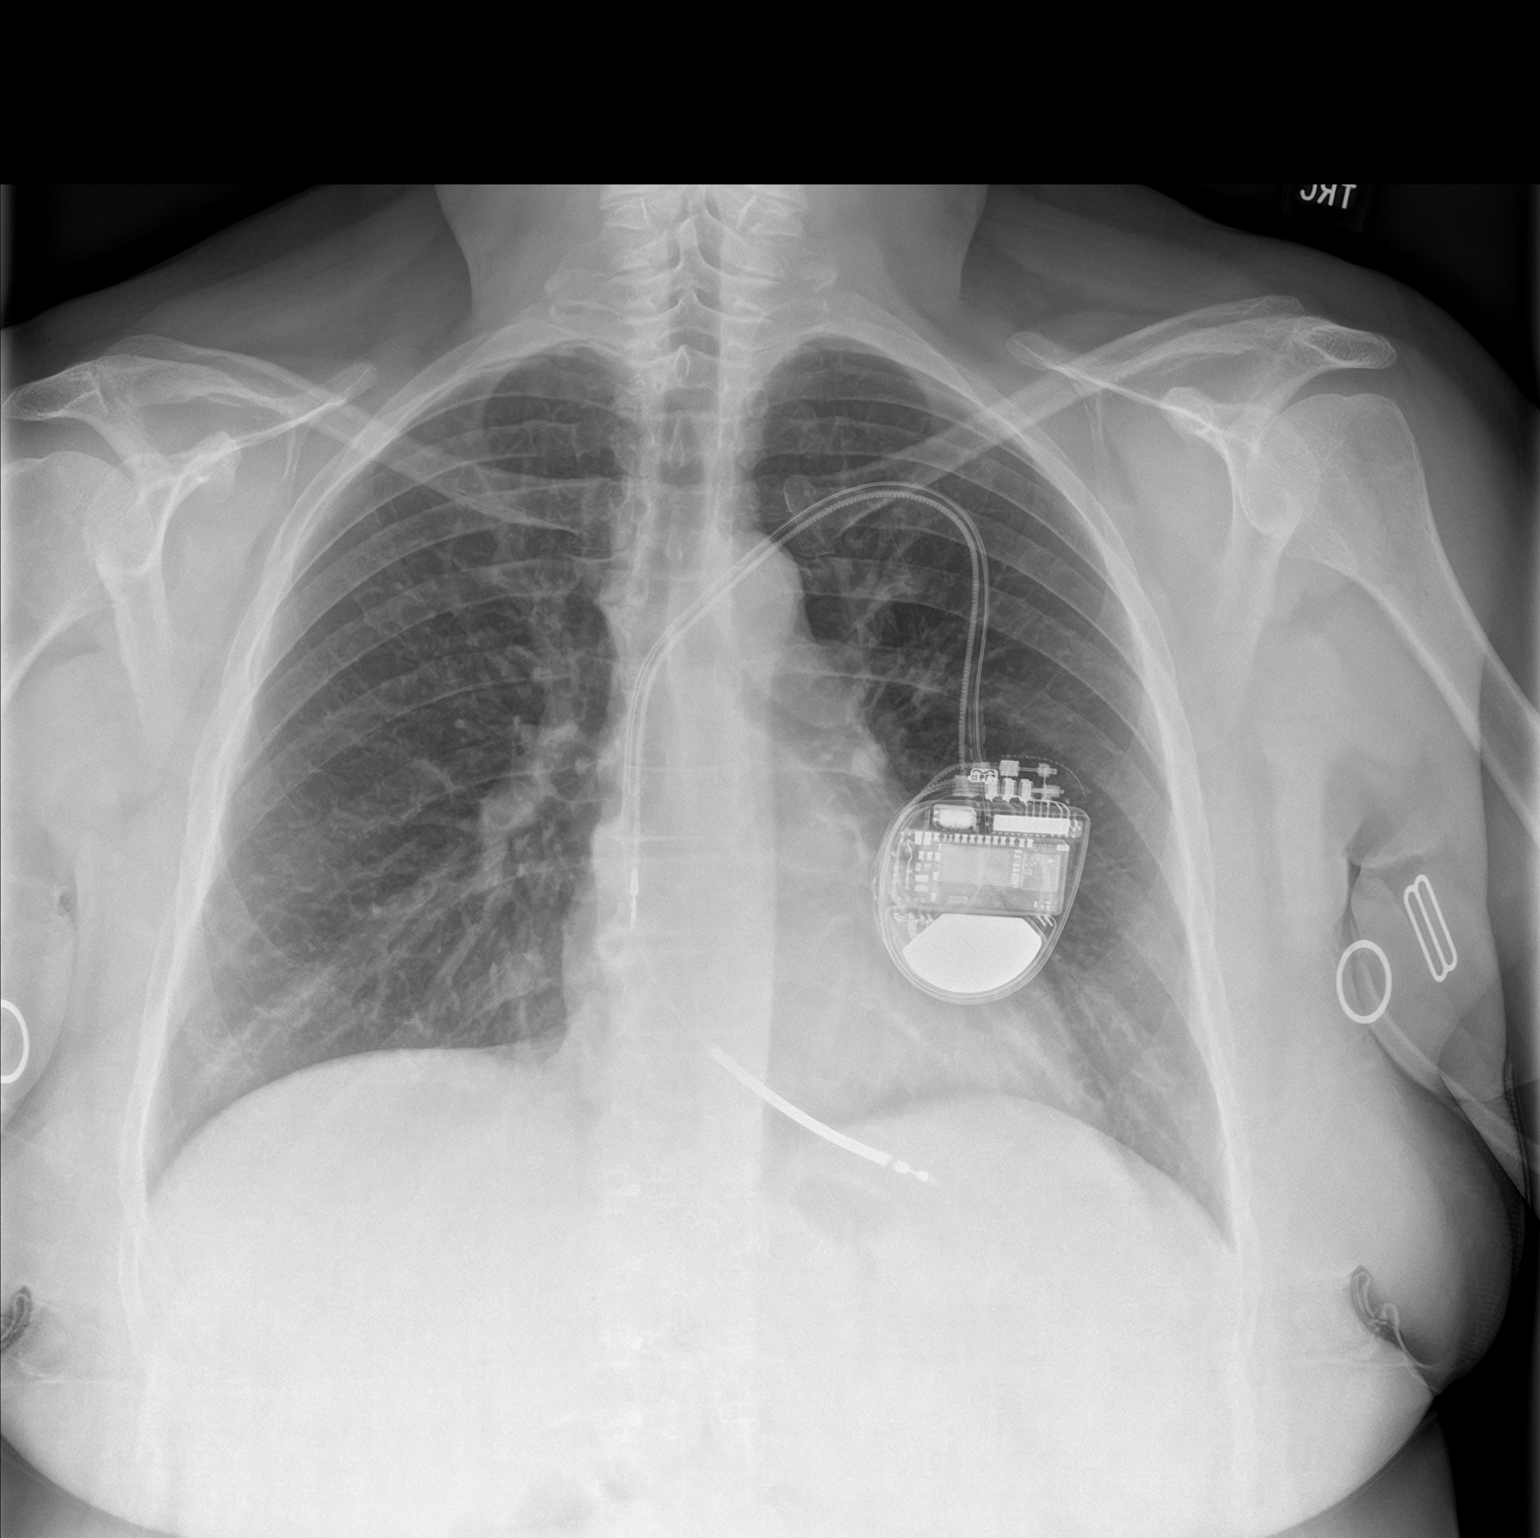
[im 2/2]
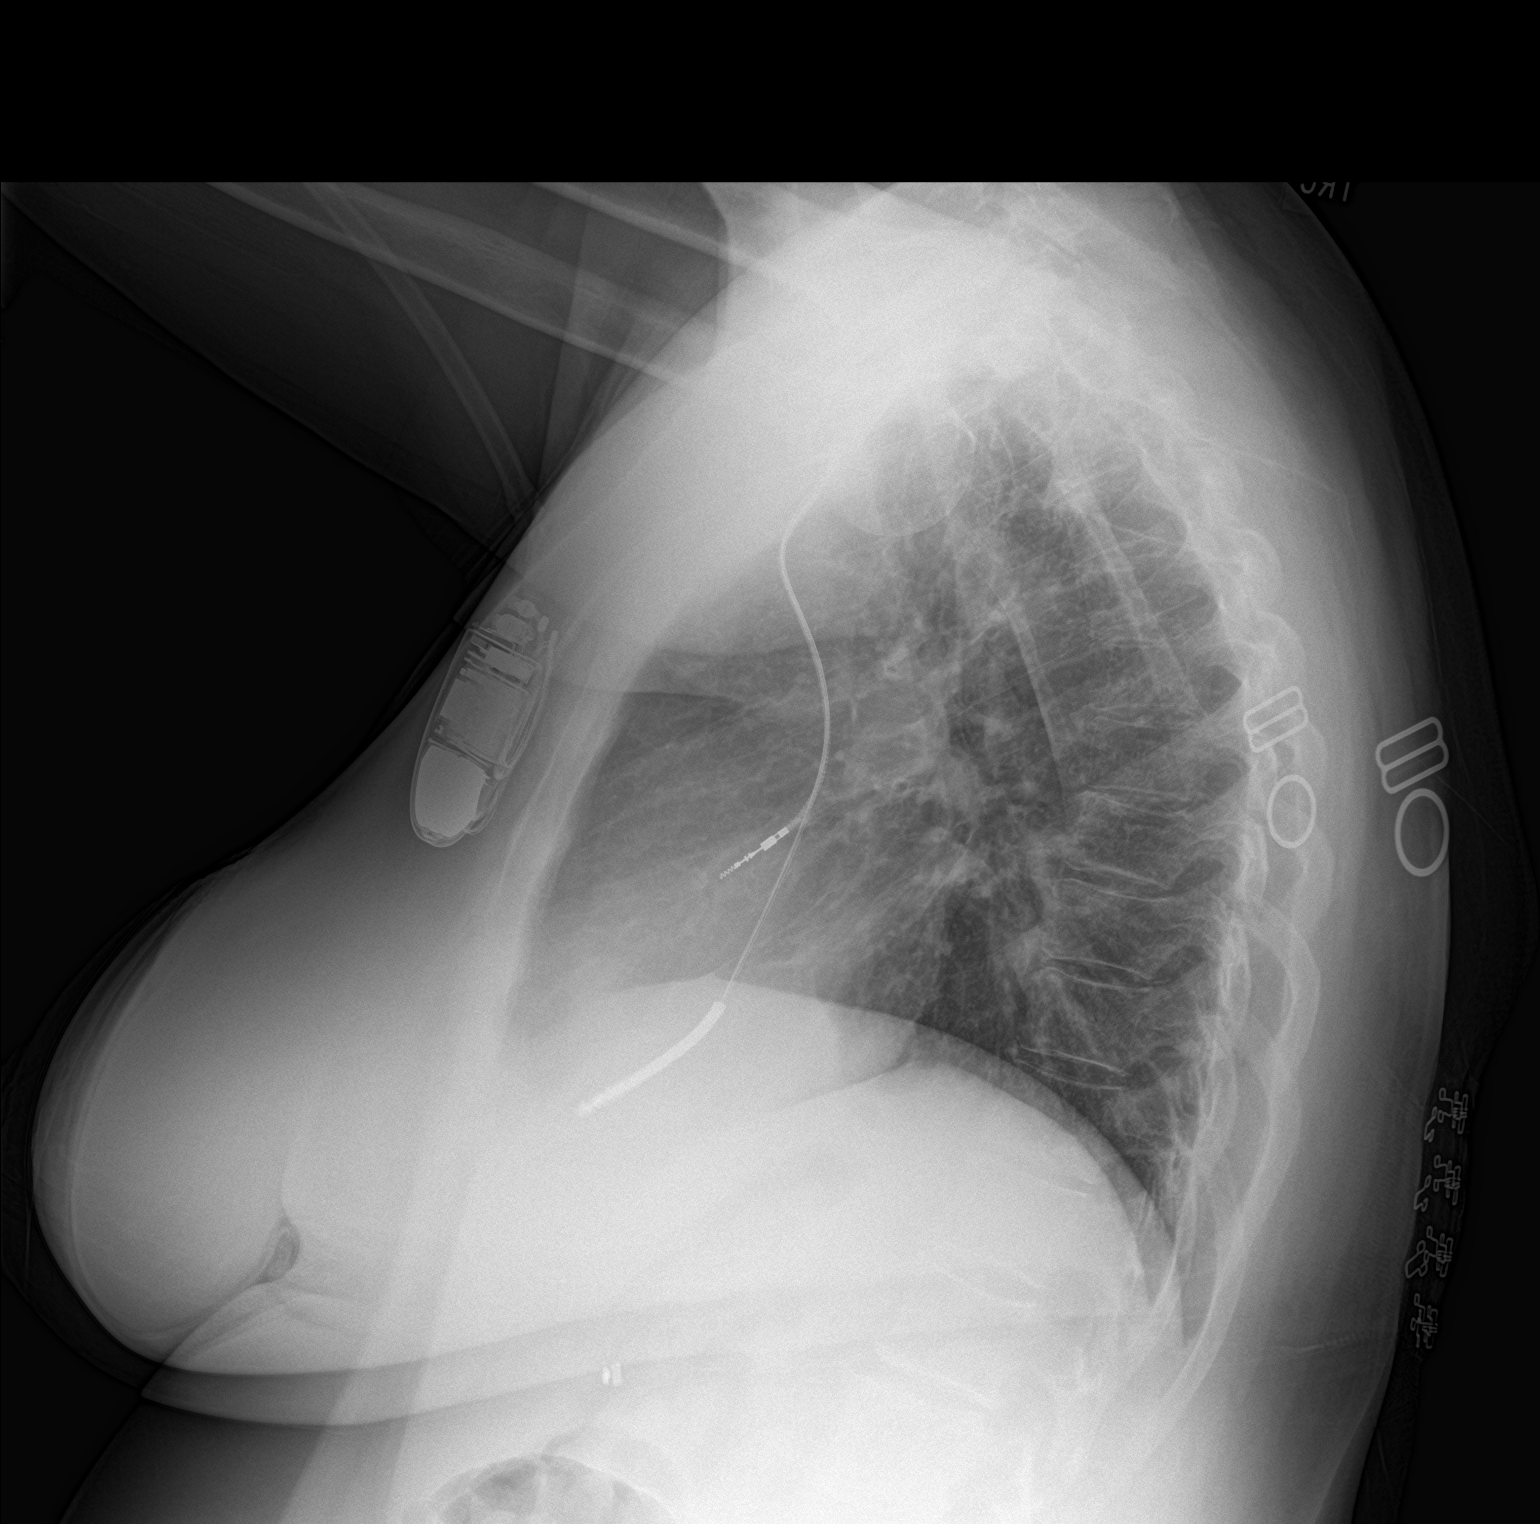

[2 of 2 positions shown; findings below may reference images not displayed]

FINDINGS: Dual lead pacer defibrillator, power pack over LEFT chest, device
entering via LEFT-sided approach with similar appearance to prior
imaging.

Subtle opacities at the RIGHT and LEFT lung base. Mild bronchial
wall thickening. No consolidation. No pleural effusion.

No pneumothorax.

On limited assessment there is no acute skeletal process.
IMPRESSION: Subtle opacities at the RIGHT and LEFT lung base may represent
atelectasis or developing infection, would consider the possibility
of viral or atypical process. No lobar consolidation or effusion.

## 2022-12-16 DIAGNOSIS — I110 Hypertensive heart disease with heart failure: Secondary | ICD-10-CM | POA: Diagnosis not present

## 2022-12-16 DIAGNOSIS — Z880 Allergy status to penicillin: Secondary | ICD-10-CM | POA: Diagnosis not present

## 2022-12-16 DIAGNOSIS — E669 Obesity, unspecified: Secondary | ICD-10-CM | POA: Diagnosis not present

## 2022-12-16 DIAGNOSIS — R079 Chest pain, unspecified: Secondary | ICD-10-CM | POA: Diagnosis not present

## 2022-12-16 DIAGNOSIS — Z885 Allergy status to narcotic agent status: Secondary | ICD-10-CM | POA: Diagnosis not present

## 2022-12-16 DIAGNOSIS — J45909 Unspecified asthma, uncomplicated: Secondary | ICD-10-CM | POA: Diagnosis not present

## 2022-12-16 DIAGNOSIS — R918 Other nonspecific abnormal finding of lung field: Secondary | ICD-10-CM | POA: Diagnosis not present

## 2022-12-16 DIAGNOSIS — I517 Cardiomegaly: Secondary | ICD-10-CM | POA: Diagnosis not present

## 2022-12-16 DIAGNOSIS — U071 COVID-19: Secondary | ICD-10-CM | POA: Diagnosis not present

## 2022-12-16 DIAGNOSIS — E876 Hypokalemia: Secondary | ICD-10-CM | POA: Diagnosis not present

## 2022-12-16 DIAGNOSIS — R0789 Other chest pain: Secondary | ICD-10-CM | POA: Diagnosis not present

## 2022-12-16 DIAGNOSIS — G4733 Obstructive sleep apnea (adult) (pediatric): Secondary | ICD-10-CM | POA: Diagnosis not present

## 2022-12-16 DIAGNOSIS — Z9989 Dependence on other enabling machines and devices: Secondary | ICD-10-CM | POA: Diagnosis not present

## 2022-12-16 DIAGNOSIS — R0602 Shortness of breath: Secondary | ICD-10-CM | POA: Diagnosis not present

## 2022-12-16 DIAGNOSIS — R059 Cough, unspecified: Secondary | ICD-10-CM | POA: Diagnosis not present

## 2022-12-21 DIAGNOSIS — G4733 Obstructive sleep apnea (adult) (pediatric): Secondary | ICD-10-CM | POA: Diagnosis not present

## 2022-12-21 DIAGNOSIS — C7A8 Other malignant neuroendocrine tumors: Secondary | ICD-10-CM | POA: Diagnosis not present

## 2022-12-21 DIAGNOSIS — F418 Other specified anxiety disorders: Secondary | ICD-10-CM | POA: Diagnosis not present

## 2022-12-21 DIAGNOSIS — G43119 Migraine with aura, intractable, without status migrainosus: Secondary | ICD-10-CM | POA: Diagnosis not present

## 2022-12-21 DIAGNOSIS — G2581 Restless legs syndrome: Secondary | ICD-10-CM | POA: Diagnosis not present

## 2022-12-21 DIAGNOSIS — G40309 Generalized idiopathic epilepsy and epileptic syndromes, not intractable, without status epilepticus: Secondary | ICD-10-CM | POA: Diagnosis not present

## 2022-12-26 ENCOUNTER — Encounter: Payer: Self-pay | Admitting: Oncology

## 2022-12-28 ENCOUNTER — Emergency Department: Payer: Medicare HMO

## 2022-12-28 ENCOUNTER — Other Ambulatory Visit: Payer: Self-pay

## 2022-12-28 ENCOUNTER — Emergency Department
Admission: EM | Admit: 2022-12-28 | Discharge: 2022-12-28 | Disposition: A | Discharge status: Home / Self Care | Payer: Medicare HMO | Attending: Emergency Medicine | Admitting: Emergency Medicine

## 2022-12-28 DIAGNOSIS — E876 Hypokalemia: Secondary | ICD-10-CM | POA: Insufficient documentation

## 2022-12-28 DIAGNOSIS — I10 Essential (primary) hypertension: Secondary | ICD-10-CM | POA: Diagnosis not present

## 2022-12-28 DIAGNOSIS — E039 Hypothyroidism, unspecified: Secondary | ICD-10-CM | POA: Insufficient documentation

## 2022-12-28 DIAGNOSIS — R5383 Other fatigue: Secondary | ICD-10-CM | POA: Diagnosis not present

## 2022-12-28 DIAGNOSIS — J0100 Acute maxillary sinusitis, unspecified: Secondary | ICD-10-CM | POA: Insufficient documentation

## 2022-12-28 DIAGNOSIS — G43809 Other migraine, not intractable, without status migrainosus: Secondary | ICD-10-CM | POA: Insufficient documentation

## 2022-12-28 DIAGNOSIS — R4182 Altered mental status, unspecified: Secondary | ICD-10-CM | POA: Diagnosis not present

## 2022-12-28 LAB — CBC
HCT: 38.5 % (ref 36.0–46.0)
Hemoglobin: 12.5 g/dL (ref 12.0–15.0)
MCH: 28.4 pg (ref 26.0–34.0)
MCHC: 32.5 g/dL (ref 30.0–36.0)
MCV: 87.5 fL (ref 80.0–100.0)
Platelets: 247 10*3/uL (ref 150–400)
RBC: 4.4 MIL/uL (ref 3.87–5.11)
RDW: 15.3 % (ref 11.5–15.5)
WBC: 8.7 10*3/uL (ref 4.0–10.5)
nRBC: 0 % (ref 0.0–0.2)

## 2022-12-28 LAB — COMPREHENSIVE METABOLIC PANEL
ALT: 41 U/L (ref 0–44)
AST: 37 U/L (ref 15–41)
Albumin: 3.8 g/dL (ref 3.5–5.0)
Alkaline Phosphatase: 113 U/L (ref 38–126)
Anion gap: 11 (ref 5–15)
BUN: 14 mg/dL (ref 6–20)
CO2: 29 mmol/L (ref 22–32)
Calcium: 8 mg/dL — ABNORMAL LOW (ref 8.9–10.3)
Chloride: 96 mmol/L — ABNORMAL LOW (ref 98–111)
Creatinine, Ser: 0.92 mg/dL (ref 0.44–1.00)
GFR, Estimated: >60 mL/min (ref >60)
Glucose, Bld: 128 mg/dL — ABNORMAL HIGH (ref 70–99)
Potassium: 3.3 mmol/L — ABNORMAL LOW (ref 3.5–5.1)
Sodium: 136 mmol/L (ref 135–145)
Total Bilirubin: 0.5 mg/dL (ref 0.3–1.2)
Total Protein: 7.9 g/dL (ref 6.5–8.1)

## 2022-12-28 LAB — MAGNESIUM: Magnesium: 2.1 mg/dL (ref 1.7–2.4)

## 2022-12-28 MED ORDER — DOXYCYCLINE MONOHYDRATE 100 MG PO TABS: 100.0000 mg | ORAL_TABLET | Freq: Two times a day (BID) | ORAL | 0 refills | Status: AC

## 2022-12-28 MED ORDER — KETOROLAC TROMETHAMINE 15 MG/ML IJ SOLN
15.0000 mg | Freq: Once | INTRAMUSCULAR | Status: AC
  Administered 2022-12-28: 15 mg via INTRAVENOUS
  Filled 2022-12-28: qty 1

## 2022-12-28 MED ORDER — POTASSIUM CHLORIDE CRYS ER 20 MEQ PO TBCR
40.0000 meq | EXTENDED_RELEASE_TABLET | Freq: Once | ORAL | Status: AC
  Administered 2022-12-28: 40 meq via ORAL
  Filled 2022-12-28: qty 2

## 2022-12-28 MED ORDER — SODIUM CHLORIDE 0.9 % IV BOLUS
1000.0000 mL | Freq: Once | INTRAVENOUS | Status: AC
  Administered 2022-12-28: 1000 mL via INTRAVENOUS

## 2022-12-28 MED ORDER — PROCHLORPERAZINE EDISYLATE 10 MG/2ML IJ SOLN
10.0000 mg | Freq: Once | INTRAMUSCULAR | Status: AC
  Administered 2022-12-28: 10 mg via INTRAVENOUS
  Filled 2022-12-28: qty 2

## 2022-12-28 NOTE — ED Triage Notes (Signed)
Pt presents to ER with c/o fatigue that started last night.  Pt states she has felt very tired, has only been awake for a very short period of time today, and has not had any appetite.  Pt reports a migraine-like HA associated with these symptoms.  Pt reports sensitivity to light and sound with this HA.  Pt states she has hx of migraines.  Pt is otherwise A&O x4, and ambulatory to triage.

## 2022-12-28 NOTE — ED Provider Notes (Signed)
[facility] Provider Note    Event Date/Time   First MD Initiated Contact with Patient 12/28/22 1945     (approximate)   History   Fatigue   HPI  Brittany Nguyen is a 57 y.o. female with past medical history significant for multiple comorbidities including hypertension, carcinoid tumor of the stomach, chronic pain syndrome, history of DVT with IVC filter, hypothyroidism, GERD, recent hospitalization for encephalopathy, who presents to the emergency department with fatigue and headache.  States that over the past 2 days she has had worsening headache with photophobia with nausea and vomiting.  States that she feels very tired and she has been more tired than normal.  Denies any falls or head trauma.  No fever or chills.  No neck stiffness or pain in her chest.  Denies any abdominal pain.  No new medications.  On multiple sedating medications.  Later her husband arrived and stated that she has not been wearing her CPAP machine for the past week because she has been having some sinus pressure.     Physical Exam   Triage Vital Signs: ED Triage Vitals  Enc Vitals Group     BP 12/28/22 1932 106/79     Pulse Rate 12/28/22 1932 93     Resp 12/28/22 1932 19     Temp 12/28/22 1932 98.3 F (36.8 C)     Temp Source 12/28/22 1932 Oral     SpO2 12/28/22 1932 96 %     Weight 12/28/22 1937 190 lb (86.2 kg)     Height --      Head Circumference --      Peak Flow --      Pain Score 12/28/22 1937 9     Pain Loc --      Pain Edu? --      Excl. in GC? --     Most recent vital signs: Vitals:   12/28/22 1932  BP: 106/79  Pulse: 93  Resp: 19  Temp: 98.3 F (36.8 C)  SpO2: 96%    Physical Exam Constitutional:      Appearance: She is well-developed.  HENT:     Head: Atraumatic.     Right Ear: Tympanic membrane normal.     Left Ear: Tympanic membrane normal.  Eyes:     Conjunctiva/sclera: Conjunctivae normal.  Cardiovascular:     Rate and Rhythm:  Regular rhythm.  Pulmonary:     Effort: No respiratory distress.  Abdominal:     General: There is no distension.     Tenderness: There is no abdominal tenderness.  Musculoskeletal:        General: Normal range of motion.     Cervical back: Normal range of motion and neck supple. No tenderness.  Skin:    General: Skin is warm.     Capillary Refill: Capillary refill takes less than 2 seconds.  Neurological:     Mental Status: She is alert. Mental status is at baseline.     GCS: GCS eye subscore is 4. GCS verbal subscore is 5. GCS motor subscore is 6.     Cranial Nerves: Cranial nerves 2-12 are intact.     Sensory: Sensation is intact.     Motor: Motor function is intact.      IMPRESSION / MDM / ASSESSMENT AND PLAN / ED COURSE  I reviewed the triage vital signs and the nursing notes.  Differential diagnosis including migraine headache, intracranial hemorrhage, polypharmacy, electrolyte abnormality, dehydration, hypercarbia from  OSA.  On arrival patient is afebrile, blood pressure 100/79.  No significant tachycardia.  No respiratory distress.  96% on room air.  RADIOLOGY I independently reviewed imaging, my interpretation of imaging: CT scan of the head without signs of intracranial hemorrhage or infarction.  Read as no acute intracranial hemorrhages.  Frontal sinus opacification that is new when compared to prior CT scan.   Labs (all labs ordered are listed, but only abnormal results are displayed) Labs interpreted as -    Labs Reviewed  COMPREHENSIVE METABOLIC PANEL - Abnormal; Notable for the following components:      Result Value   Potassium 3.3 (*)    Chloride 96 (*)    Glucose, Bld 128 (*)    Calcium 8.0 (*)    All other components within normal limits  CBC  MAGNESIUM  URINALYSIS, ROUTINE W REFLEX MICROSCOPIC      Patient with no significant leukocytosis.  Mild hypokalemia.  No significant electrolyte abnormalities other than the mild potassium that is low.  Most  likely secondary to her nausea and vomiting.  Patient treated with a migraine cocktail  On reevaluation patient states that she is feeling tired from the migraine cocktail but had significant improvement of her headache.  No longer with nausea or vomiting.  Clinical picture with her fatigue is most likely multifactorial, on chart review patient had a recent hospitalization for encephalopathy that was secondary to polypharmacy.  Patient on review of her home medications is on a significant amount of medications that will cause fatigue and altered mental status.  OSA and not using her CPAP is also likely contributing.  Discussed at length holding any medications and written out a list until she had improved.  Encouraged to wear her CPAP machine tonight.  Given her CT scan of her head that showed findings concerning for a sinusitis, symptoms for greater than a week so we will treat.  Has a history of a penicillin allergy.  Will treat with doxycycline.  Discussed close follow-up with primary care physician.  Given return precautions for any worsening symptoms.  Patient's husband is at bedside and agrees with the plan.  Given return precautions for any worsening symptoms.   PROCEDURES:  Critical Care performed: No  Procedures  Patient's presentation is most consistent with acute presentation with potential threat to life or bodily function.   MEDICATIONS ORDERED IN ED: Medications  sodium chloride 0.9 % bolus 1,000 mL (0 mLs Intravenous Stopped 12/28/22 2156)  prochlorperazine (COMPAZINE) injection 10 mg (10 mg Intravenous Given 12/28/22 2055)  ketorolac (TORADOL) 15 MG/ML injection 15 mg (15 mg Intravenous Given 12/28/22 2055)  potassium chloride SA (KLOR-CON M) CR tablet 40 mEq (40 mEq Oral Given 12/28/22 2153)    FINAL CLINICAL IMPRESSION(S) / ED DIAGNOSES   Final diagnoses:  Other fatigue  Other migraine without status migrainosus, not intractable  Acute non-recurrent maxillary sinusitis      Rx / DC Orders   ED Discharge Orders          Ordered    doxycycline (ADOXA) 100 MG tablet  2 times daily        12/28/22 2254             Note:  This document was prepared using Dragon voice recognition software and may include unintentional dictation errors.   Corena Herter, MD 12/28/22 2308

## 2022-12-28 NOTE — Discharge Instructions (Signed)
Brittany Nguyen was seen in the emergency department for headache and worsening fatigue.  It is important that you wear your CPAP machine, wear your CPAP machine tonight because I believe this is contributing to your severe fatigue and likely her migraine headache.  You had findings on CT scan concerning for possible sinus infection, we called in antibiotics for doxycycline given your penicillin allergy.  Follow-up closely with your primary care physician.  You are on multiple medications that can cause worsening altered mental status and add to your fatigue.  Make sure that you do not take these medications while you are having severe fatigue and follow-up closely with your primary care physician.  Medications to hold until your fatigue improves/resolves: Fentanyl patch Hold your home dose of gabapentin tonight Flexeril Morphine tablets Percocet Ativan Robaxin   Call your primary care physician first thing in the morning to schedule close follow-up appointment.  Doxycycline - This medication can cause acid reflux.  It is important that you take it with food and drink plenty of water.  Do not lie down for 1 hour after taking this medication.  It also causes sun sensitivity so stay out of the sun or wear SPF while on this medication.

## 2023-01-02 ENCOUNTER — Ambulatory Visit
Admission: RE | Admit: 2023-01-02 | Discharge: 2023-01-02 | Disposition: A | Discharge status: Home / Self Care | Payer: Medicare HMO | Source: Ambulatory Visit | Attending: Family Medicine | Admitting: Family Medicine

## 2023-01-02 DIAGNOSIS — I5042 Chronic combined systolic (congestive) and diastolic (congestive) heart failure: Secondary | ICD-10-CM | POA: Diagnosis not present

## 2023-01-02 DIAGNOSIS — Z1231 Encounter for screening mammogram for malignant neoplasm of breast: Secondary | ICD-10-CM

## 2023-01-02 DIAGNOSIS — Z45018 Encounter for adjustment and management of other part of cardiac pacemaker: Secondary | ICD-10-CM | POA: Diagnosis not present

## 2023-01-06 ENCOUNTER — Encounter: Payer: Self-pay | Admitting: Oncology

## 2023-01-06 ENCOUNTER — Other Ambulatory Visit: Payer: Self-pay | Admitting: Family Medicine

## 2023-01-06 DIAGNOSIS — N63 Unspecified lump in unspecified breast: Secondary | ICD-10-CM

## 2023-01-06 DIAGNOSIS — K5909 Other constipation: Secondary | ICD-10-CM | POA: Diagnosis not present

## 2023-01-06 DIAGNOSIS — G893 Neoplasm related pain (acute) (chronic): Secondary | ICD-10-CM | POA: Diagnosis not present

## 2023-01-06 DIAGNOSIS — R928 Other abnormal and inconclusive findings on diagnostic imaging of breast: Secondary | ICD-10-CM

## 2023-01-10 ENCOUNTER — Emergency Department
Admission: RE | Admit: 2023-01-10 | Discharge: 2023-01-10 | Disposition: A | Discharge status: Home / Self Care | Payer: Medicare HMO | Source: Ambulatory Visit | Attending: Family Medicine | Admitting: Family Medicine

## 2023-01-10 ENCOUNTER — Emergency Department
Admission: EM | Admit: 2023-01-10 | Discharge: 2023-01-10 | Disposition: A | Discharge status: Home / Self Care | Payer: Medicare HMO | Attending: Emergency Medicine | Admitting: Emergency Medicine

## 2023-01-10 DIAGNOSIS — R928 Other abnormal and inconclusive findings on diagnostic imaging of breast: Secondary | ICD-10-CM

## 2023-01-10 DIAGNOSIS — N6489 Other specified disorders of breast: Secondary | ICD-10-CM | POA: Diagnosis not present

## 2023-01-10 DIAGNOSIS — H6692 Otitis media, unspecified, left ear: Secondary | ICD-10-CM | POA: Diagnosis not present

## 2023-01-10 DIAGNOSIS — H9202 Otalgia, left ear: Secondary | ICD-10-CM | POA: Diagnosis present

## 2023-01-10 DIAGNOSIS — H6690 Otitis media, unspecified, unspecified ear: Secondary | ICD-10-CM

## 2023-01-10 DIAGNOSIS — N63 Unspecified lump in unspecified breast: Secondary | ICD-10-CM | POA: Diagnosis not present

## 2023-01-10 DIAGNOSIS — R92321 Mammographic fibroglandular density, right breast: Secondary | ICD-10-CM | POA: Diagnosis not present

## 2023-01-10 MED ORDER — DOXYCYCLINE HYCLATE 100 MG PO TABS: 100.0000 mg | ORAL_TABLET | Freq: Two times a day (BID) | ORAL | 0 refills | Status: DC

## 2023-01-10 NOTE — ED Triage Notes (Signed)
Pt sts that two weeks ago she got her left ear pierced. Since than pt sts that she has been having fever in the ear with pain and swelling. Pt sts that she has attempted to take out the ear ring but it is to painful to take out.

## 2023-01-10 NOTE — Discharge Instructions (Signed)
I would expect the redness and pain in the ear to gradually improve over the next several days. Please be sure to take the antibiotic as prescribed. If worsening symptoms return to the ED

## 2023-01-10 NOTE — ED Provider Notes (Signed)
   [facility] Provider Note    Event Date/Time   First MD Initiated Contact with Patient 01/10/23 1309     (approximate)   History   Ear Injury   HPI  Brittany Nguyen is a 57 y.o. female who presents with complaints of hearing stuck in her left ear.  She had it placed 3 weeks ago, she reports has had redness and swelling.  She has not been able to get it out or her husband because of pain and discomfort, subjective fevers     Physical Exam   Triage Vital Signs: ED Triage Vitals  Enc Vitals Group     BP 01/10/23 1222 (!) 114/91     Pulse Rate 01/10/23 1222 92     Resp 01/10/23 1222 17     Temp 01/10/23 1222 98.1 F (36.7 C)     Temp Source 01/10/23 1222 Oral     SpO2 01/10/23 1222 98 %     Weight 01/10/23 1223 90.7 kg (200 lb)     Height --      Head Circumference --      Peak Flow --      Pain Score 01/10/23 1223 7     Pain Loc --      Pain Edu? --      Excl. in GC? --     Most recent vital signs: Vitals:   01/10/23 1222  BP: (!) 114/91  Pulse: 92  Resp: 17  Temp: 98.1 F (36.7 C)  SpO2: 98%     General: Awake, no distress.  CV:  Good peripheral perfusion.  Resp:  Normal effort.  Abd:  No distention.  Other:  Left ear: Mild swelling, mild redness, small mount of purulent discharge when squeezed around the earring, however no evidence of abscess or fluctuance   ED Results / Procedures / Treatments   Labs (all labs ordered are listed, but only abnormal results are displayed) Labs Reviewed - No data to display   EKG     RADIOLOGY     PROCEDURES:    Procedures   MEDICATIONS ORDERED IN ED: Medications - No data to display   IMPRESSION / MDM / ASSESSMENT AND PLAN / ED COURSE  I reviewed the triage vital signs and the nursing notes. Patient's presentation is most consistent with acute, uncomplicated illness.  Patient with evidence of infected foreign body in the left ear.  Was able to remove the earring  using 2 forceps with twisting maneuver.  Evidence of skin infection at the site, will start patient on Doxycycline, outpatient follow-up as needed        FINAL CLINICAL IMPRESSION(S) / ED DIAGNOSES   Final diagnoses:  Ear infection     Rx / DC Orders   ED Discharge Orders          Ordered    doxycycline (VIBRA-TABS) 100 MG tablet  2 times daily        01/10/23 1320             Note:  This document was prepared using Dragon voice recognition software and may include unintentional dictation errors.   Jene Every, MD 01/10/23 [phone]

## 2023-01-12 ENCOUNTER — Other Ambulatory Visit: Payer: Self-pay | Admitting: Family Medicine

## 2023-01-12 ENCOUNTER — Encounter: Payer: Self-pay | Admitting: Oncology

## 2023-01-12 DIAGNOSIS — Z95810 Presence of automatic (implantable) cardiac defibrillator: Secondary | ICD-10-CM | POA: Diagnosis not present

## 2023-01-12 DIAGNOSIS — N63 Unspecified lump in unspecified breast: Secondary | ICD-10-CM

## 2023-01-12 DIAGNOSIS — I4891 Unspecified atrial fibrillation: Secondary | ICD-10-CM | POA: Diagnosis not present

## 2023-01-12 DIAGNOSIS — I509 Heart failure, unspecified: Secondary | ICD-10-CM | POA: Diagnosis not present

## 2023-01-12 DIAGNOSIS — Z4502 Encounter for adjustment and management of automatic implantable cardiac defibrillator: Secondary | ICD-10-CM | POA: Diagnosis not present

## 2023-01-12 DIAGNOSIS — R928 Other abnormal and inconclusive findings on diagnostic imaging of breast: Secondary | ICD-10-CM

## 2023-01-18 ENCOUNTER — Ambulatory Visit
Admission: RE | Admit: 2023-01-18 | Discharge: 2023-01-18 | Disposition: A | Discharge status: Home / Self Care | Payer: Medicare HMO | Source: Ambulatory Visit | Attending: Family Medicine | Admitting: Family Medicine

## 2023-01-18 DIAGNOSIS — N63 Unspecified lump in unspecified breast: Secondary | ICD-10-CM

## 2023-01-18 DIAGNOSIS — N6313 Unspecified lump in the right breast, lower outer quadrant: Secondary | ICD-10-CM | POA: Diagnosis not present

## 2023-01-18 DIAGNOSIS — R928 Other abnormal and inconclusive findings on diagnostic imaging of breast: Secondary | ICD-10-CM | POA: Insufficient documentation

## 2023-01-18 DIAGNOSIS — N6081 Other benign mammary dysplasias of right breast: Secondary | ICD-10-CM | POA: Diagnosis not present

## 2023-01-18 HISTORY — PX: BREAST BIOPSY: SHX20

## 2023-01-18 MED ORDER — CHLOROPROCAINE HCL 2 % IJ SOLN
8.0000 mL | Freq: Once | INTRAMUSCULAR | Status: AC
  Administered 2023-01-18: 8 mL
  Filled 2023-01-18: qty 30

## 2023-01-19 LAB — SURGICAL PATHOLOGY
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
## 2023-01-26 ENCOUNTER — Emergency Department
Admission: EM | Admit: 2023-01-26 | Discharge: 2023-01-26 | Disposition: A | Discharge status: Home / Self Care | Payer: Medicare HMO | Attending: Emergency Medicine | Admitting: Emergency Medicine

## 2023-01-26 ENCOUNTER — Encounter: Payer: Self-pay | Admitting: Oncology

## 2023-01-26 ENCOUNTER — Emergency Department: Payer: Medicare HMO

## 2023-01-26 DIAGNOSIS — M25562 Pain in left knee: Secondary | ICD-10-CM | POA: Diagnosis not present

## 2023-01-26 DIAGNOSIS — M25561 Pain in right knee: Secondary | ICD-10-CM | POA: Diagnosis not present

## 2023-01-26 DIAGNOSIS — Z96652 Presence of left artificial knee joint: Secondary | ICD-10-CM | POA: Diagnosis not present

## 2023-01-26 DIAGNOSIS — X500XXA Overexertion from strenuous movement or load, initial encounter: Secondary | ICD-10-CM | POA: Insufficient documentation

## 2023-01-26 DIAGNOSIS — M25462 Effusion, left knee: Secondary | ICD-10-CM | POA: Diagnosis not present

## 2023-01-26 MED ORDER — MELOXICAM 15 MG PO TABS: 15.0000 mg | ORAL_TABLET | Freq: Every day | ORAL | 0 refills | Status: AC

## 2023-01-26 NOTE — ED Notes (Signed)
Patient discharged at this time. Ambulated to lobby with independent and steady gait. Breathing unlabored speaking in full sentences. Verbalized understanding of all discharge, follow up, and medication teaching. Discharged homed with all belongings.   

## 2023-01-26 NOTE — Discharge Instructions (Signed)
Please take the medication as prescribed.  You can wear the knee brace as you feel comfortable.  Return to activity as tolerated.  If you have ongoing pain please follow-up with your orthopedic provider.  It was a pleasure to care for you today!

## 2023-01-26 NOTE — ED Provider Notes (Signed)
[facility] Provider Note    Event Date/Time   First MD Initiated Contact with Patient 01/26/23 2220     (approximate)   History   Knee Pain   HPI  Brittany Nguyen is a 57 y.o. female with history of arthritis s/p left total knee arthroplasty who presented to the ED with knee pain ongoing for 3 days.  Patient reports that her dog pulled her down the stairs and her bilateral knee pain began after that.  She states that the pain is significantly worse in her left knee.  She states she did not fall and is unable to tell me if she twisted her knee.  Her pain has been getting progressively worse throughout the week which is what brought her in today.  She has had some difficulty walking, and has gone back to using the assistive devices she was given for her knee surgery.     Physical Exam   Triage Vital Signs: ED Triage Vitals  Enc Vitals Group     BP 01/26/23 2213 121/81     Pulse Rate 01/26/23 2212 84     Resp 01/26/23 2212 16     Temp 01/26/23 2212 98.2 F (36.8 C)     Temp Source 01/26/23 2212 Oral     SpO2 01/26/23 2212 99 %     Weight 01/26/23 2213 194 lb (88 kg)     Height 01/26/23 2213 5\' 5"  (1.651 m)     Head Circumference --      Peak Flow --      Pain Score 01/26/23 2213 10     Pain Loc --      Pain Edu? --      Excl. in GC? --     Most recent vital signs: Vitals:   01/26/23 2212 01/26/23 2213  BP:  121/81  Pulse: 84   Resp: 16   Temp: 98.2 F (36.8 C)   SpO2: 99%      General: Awake, no distress.  CV:  Good peripheral perfusion.  Resp:  Normal effort.  Abd:  No distention.  Extremities: Well-healed scar on the left knee, mild infrapatellar swelling of the left knee when compared to the right, TTP over bilateral patellas, limited ROM in left knee flexion and extension, 5/5 strength RLE, 4/5 strength LLE, reduced sensation of the lateral left knee.    ED Results / Procedures / Treatments   Labs (all labs ordered are  listed, but only abnormal results are displayed) Labs Reviewed - No data to display  RADIOLOGY  Left knee x-rays obtained in the ED today.  I independently interpreted and reviewed the images as well as reviewed the radiologist report.  X-rays are negative for fracture, but do show a small effusion.  PROCEDURES:  Critical Care performed: No  Procedures   MEDICATIONS ORDERED IN ED: Medications - No data to display   IMPRESSION / MDM / ASSESSMENT AND PLAN / ED COURSE  I reviewed the triage vital signs and the nursing notes.                              Differential diagnosis includes, but is not limited to, fracture, tendon injury, ligamentous injury, joint effusion, joint inflammation.  Patient's presentation is most consistent with exacerbation of chronic illness.  Patient presented to the ED today with knee pain ongoing for 3 days after getting pulled down the stairs by her  dog.  Discussed getting bilateral knee x-rays with patient, however since her pain is worse in the left side we elected to only x-ray the left knee.  She is aware that if she continues to have ongoing pain in the right knee she can return for another x-ray or follow-up with orthopedics.  I independently reviewed and interpreted the x-ray images, which were negative for fracture but did show a small effusion.  I believe patient's presentation is most consistent with joint inflammation.  I discussed with patient the reasons why I did not feel it was necessary to perform a joint aspiration including risk of infection with her hardware, as well as it being likely to return due to the traumatic mechanism of injury.  Patient was agreeable to plan.  She was given meloxicam and a hinged knee brace.  Advised her to take it easy for the next couple days, but she can return to activity as tolerated.  She voiced understanding, all questions were answered, patient stable for discharge.       FINAL CLINICAL IMPRESSION(S) / ED  DIAGNOSES   Final diagnoses:  Acute pain of left knee     Rx / DC Orders   ED Discharge Orders          Ordered    meloxicam (MOBIC) 15 MG tablet  Daily        01/26/23 2324             Note:  This document was prepared using Dragon voice recognition software and may include unintentional dictation errors.   Cruz Condon, PA-C 01/26/23 2328    Minna Antis, MD 01/27/23 1539

## 2023-01-26 NOTE — ED Triage Notes (Signed)
Pt reports she got tangled up with her dog pta and since has been having left knee pain.

## 2023-01-30 ENCOUNTER — Encounter: Payer: Self-pay | Admitting: Oncology

## 2023-02-02 DIAGNOSIS — Z4509 Encounter for adjustment and management of other cardiac device: Secondary | ICD-10-CM | POA: Diagnosis not present

## 2023-02-02 DIAGNOSIS — I5042 Chronic combined systolic (congestive) and diastolic (congestive) heart failure: Secondary | ICD-10-CM | POA: Diagnosis not present

## 2023-02-05 NOTE — Progress Notes (Unsigned)
MRN : [id number]  Brittany Nguyen is a 57 y.o. (May 11, 1966) female who presents with chief complaint of legs hurt and swell.  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  DVT was identified years ago and was treated with anticoagulation.  The presenting symptoms were pain and swelling in the lower extremity.   IVC filter was placed on 08/23/2022: Placement of a option Elite IVC filter    She is now s/p  Left total knee arthroplasty using computer-assisted navigation on 08/24/2022   Since her last visit there was the question of seizure activity and when brought to to [facility] on December 01, 2022 she was also having significant left knee pain.  She was seen by Dr. Ernest Pine at that time and it was felt that her knee pain was not orthopedic in nature likely more neurogenic.  More recently she was seen Jan 26, 2023 after she was pulled down some stairs by her dog and again was complaining of bilateral knee pain.   The patient has not been using compression therapy at this point.   No SOB or pleuritic chest pains.  No cough or hemoptysis.   No blood per rectum or blood in any sputum.  No excessive bruising per the patient.    No recent shortening of the patient's walking distance or new symptoms consistent with claudication.  No history of rest pain symptoms. No new ulcers or wounds of the lower extremities have occurred.  No outpatient medications have been marked as taking for the 02/06/23 encounter (Appointment) with Gilda Crease, Latina Craver, MD.    Past Medical History:  Diagnosis Date   Acute anterolateral wall MI (HCC) 08/2016   AICD (automatic cardioverter/defibrillator) present 03/29/2017   a.) Medtronic Evera MRI XT DR SureScan (SN: [id number])   Anxiety    Arthritis    Asthma 2013   C. 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   a.) Bx 02/22/2018 --> NE tumor cells --> AE1/AE3, chromogranin, synaptophysin (+); Ki-67 proliferation rate <1%; b.) repeat Bx 08/24/2018 --> well differentiard NET (G1)   Chronic, continuous use of opioids    a.) has naloxone Rx available   COPD (chronic obstructive pulmonary disease) (HCC)    Depression    Diet-controlled type 2 diabetes mellitus (HCC)    Diverticulitis 2010   DVT (deep venous thrombosis) (HCC)    Endometriosis 1990   Generalized epilepsy (HCC)    a.) on lacosamide; b.) last seizure 07-30-22 in setting of hypokalemia (K+ 2.6)   GERD (gastroesophageal reflux disease)    GIB (gastrointestinal bleeding) 01/08/2018   HFrEF (heart failure with reduced ejection fraction) (HCC)    a.)TTE 12/09/16: EF 25-30%, dif HK; b.) TTE 02/08/17: EF 25%, dif HK, mild TR, G1DD; c.) TTE 04/25/17: EF 30-35%, mild LVH; d.) TTE 01/30/18: EF 45%, MAC, LAE, mild MR, G1DD; e.) TTE 11/06/2018: EF 25%, sev glob HK, triv MR/TR/PR; f.) TTE 05/12/19: EF 35%, MAC, AoV sclerosis, G1DD; g.) TTE 11/05/19: EF 20%, sev glob HK, triv MR; h.) TTE 06/17/20: EF 40%, mod glob HK, triv MR; I:) TTE 06/14/22: EF 25-30%, G1DD   History of cardiac catheterization    a.) [facility] 10/21/2014: normal coronaries; b.) [facility] 02/24/2017: normal coronaries, mRA 12, mPA 28, mPCWP 15, CO 8.0, CI 3.8   History of kidney stones  Hx MRSA infection    Hypokalemia    Hypothyroidism    Iron deficiency anemia 03/27/2018   Lump or mass in breast 11/27/2012   a.) Bx 11/27/2012 --> apocrine wall cyst   Migraine    Nausea & vomiting    Neuropathy    Non-ischemic cardiomyopathy (HCC)    a.) TTE 12/09/2016: EF 25-30%, b.) TTE 02/08/2017: EF 25%; c.) Medtronic AICD placed 03/29/2017; d.) TTE 04/25/2017: EF 30-35%; e.) TTE 01/30/2018: EF 45%; f.) TTE 11/06/2018: EF 25%; g.) TTE 05/12/2019: EF 35%; h.) TTE 11/05/2019: EF 20%; I.) TTE 06/17/2020: EF 40%; J.) TTE 06/14/2022: EF 25-30%   Obesity    OSA on CPAP    PAF (paroxysmal atrial  fibrillation) (HCC)    PONV (postoperative nausea and vomiting)    Post-COVID chronic cough    Postcholecystectomy diarrhea    Prolonged Q-T interval on ECG    Restless leg    a.) on ropinirole   Unstable angina (HCC)     Past Surgical History:  Procedure Laterality Date   ABDOMINAL HYSTERECTOMY     age 38   BREAST BIOPSY Right 2014   benign   BREAST BIOPSY Right 01/18/2023   [place] Core Bx, coil clip - path pending   BREAST BIOPSY Right 01/18/2023   [place] RT BREAST BX W LOC DEV 1ST LESION IMG BX SPEC US GUIDE 01/18/2023 [facility]   CARDIAC DEFIBRILLATOR PLACEMENT Left 03/29/2017   Procedure: MEDTRONIC CARDIAC DEFIBRILLATOR PLACEMENT (AICD); Location: [facility]; Surgeon: Bonne Dolores, MD   CESAREAN SECTION     CHOLECYSTECTOMY     COLECTOMY     COLONOSCOPY WITH PROPOFOL N/A 02/22/2018   Procedure: COLONOSCOPY WITH PROPOFOL;  Surgeon: Toledo, Boykin Nearing, MD;  Location: [facility];  Service: Gastroenterology;  Laterality: N/A;   ESOPHAGOGASTRODUODENOSCOPY (EGD) WITH PROPOFOL N/A 02/22/2018   Procedure: ESOPHAGOGASTRODUODENOSCOPY (EGD) WITH PROPOFOL;  Surgeon: Toledo, Boykin Nearing, MD;  Location: [facility];  Service: Gastroenterology;  Laterality: N/A;   IVC FILTER INSERTION N/A 08/23/2022   Procedure: IVC FILTER INSERTION;  Surgeon: Renford Dills, MD;  Location: [facility];  Service: Cardiovascular;  Laterality: N/A;   KNEE ARTHROPLASTY Left 08/24/2022   Procedure: COMPUTER ASSISTED TOTAL KNEE ARTHROPLASTY;  Surgeon: Donato Heinz, MD;  Location: [facility];  Service: Orthopedics;  Laterality: Left;   LEFT HEART CATH AND CORONARY ANGIOGRAPHY Left 10/21/2014   Procedure: LEFT HEART CATH AND CORONARY ANGIOGRAPHY; Location: [facility]; Surgeon: Arnoldo Hooker, MD   NASAL SINUS SURGERY  2012   OOPHORECTOMY     RIGHT/LEFT HEART CATH AND CORONARY ANGIOGRAPHY Bilateral 02/24/2017   Procedure: RIGHT/LEFT HEART CATH AND CORONARY ANGIOGRAPHY; Location: [facility]   THYROIDECTOMY       Social History Social History   Tobacco Use   Smoking status: Never   Smokeless tobacco: Never  Vaping Use   Vaping Use: Never used  Substance Use Topics   Alcohol use: No   Drug use: Never    Family History Family History  Problem Relation Age of Onset   Breast cancer Mother    Cancer Mother 47       ovarian   CAD Mother    Cancer Father 24       brain   CAD Father    Cancer Daughter 82       skin   Breast cancer Maternal Aunt    Cancer Maternal Aunt 34       breast   Leukemia Paternal Grandfather    Breast cancer Cousin  Allergies  Allergen Reactions   Contrast Media [Iodinated Contrast Media] Shortness Of Breath    Per CT report in 2014 pt had break through contrast reaction with hives and SOB following 13 hour prep. MSY    Ferumoxytol Nausea Only   Iron Sucrose Anaphylaxis   Lidocaine Hives   Metrizamide Shortness Of Breath   Penicillins Hives and Other (See Comments)    IgE = 4 (WNL) on 08/12/2022  ediate rash, facial/tongue/throat swelling, SOB or lightheadedness with hypotension, tachy   Sacubitril-Valsartan     Sz (unclear if new start entresto vs hypoglycemia)   Isosorbide Nitrate Other (See Comments)    Headache   Latex Hives   Ondansetron Other (See Comments)    Severe headache   Tizanidine Other (See Comments)    Feels altered   Povidone-Iodine Rash    Blistering rash   Pulmicort [Budesonide] Itching     REVIEW OF SYSTEMS (Negative unless checked)  Constitutional: [] Weight loss  [] Fever  [] Chills Cardiac: [] Chest pain   [] Chest pressure   [] Palpitations   [] Shortness of breath when laying flat   [] Shortness of breath with exertion. Vascular:  [] Pain in legs with walking   [x] Pain in legs at rest  [] History of DVT   [] Phlebitis   [x] Swelling in legs   [] Varicose veins   [] Non-healing ulcers Pulmonary:   [] Uses home oxygen   [] Productive cough   [] Hemoptysis   [] Wheeze  [x] COPD   [] Asthma Neurologic:  [] Dizziness   [] Seizures    [] History of stroke   [] History of TIA  [] Aphasia   [] Vissual changes   [] Weakness or numbness in arm   [] Weakness or numbness in leg Musculoskeletal:   [] Joint swelling   [x] Joint pain   [] Low back pain Hematologic:  [] Easy bruising  [] Easy bleeding   [] Hypercoagulable state   [] Anemic Gastrointestinal:  [] Diarrhea   [] Vomiting  [x] Gastroesophageal reflux/heartburn   [] Difficulty swallowing. Genitourinary:  [] Chronic kidney disease   [] Difficult urination  [] Frequent urination   [] Blood in urine Skin:  [] Rashes   [] Ulcers  Psychological:  [x] History of anxiety   []  History of major depression.  Physical Examination  There were no vitals filed for this visit. There is no height or weight on file to calculate BMI. Gen: WD/WN, NAD Head: [place]/AT, No temporalis wasting.  Ear/Nose/Throat: Hearing grossly intact, nares w/o erythema or drainage, pinna without lesions Eyes: PER, EOMI, sclera nonicteric.  Neck: Supple, no gross masses.  No JVD.  Pulmonary:  Good air movement, no audible wheezing, no use of accessory muscles.  Cardiac: RRR, precordium not hyperdynamic. Vascular:  scattered varicosities present bilaterally.  Moderate venous stasis changes to the legs bilaterally.  2+ soft pitting edema. CEAP [id number]   Vessel Right Left  Radial Palpable Palpable  Gastrointestinal: soft, non-distended. No guarding/no peritoneal signs.  Musculoskeletal: M/S 5/5 throughout.  No deformity.  Neurologic: CN 2-12 intact. Pain and light touch intact in extremities.  Symmetrical.  Speech is fluent. Motor exam as listed above. Psychiatric: Judgment intact, Mood & affect appropriate for pt's clinical situation. Dermatologic: Venous rashes no ulcers noted.  No changes consistent with cellulitis. Lymph : No lichenification or skin changes of chronic lymphedema.  CBC Lab Results  Component Value Date   WBC 8.7 12/28/2022   HGB 12.5 12/28/2022   HCT 38.5 12/28/2022   MCV 87.5 12/28/2022   PLT 247 12/28/2022     BMET    Component Value Date/Time   NA 136 12/28/2022 2003   NA 139 10/03/2014  1838   K 3.3 (L) 12/28/2022 2003   K 3.9 10/03/2014 1838   CL 96 (L) 12/28/2022 2003   CL 106 10/03/2014 1838   CO2 29 12/28/2022 2003   CO2 28 10/03/2014 1838   GLUCOSE 128 (H) 12/28/2022 2003   GLUCOSE 118 (H) 10/03/2014 1838   BUN 14 12/28/2022 2003   BUN 11 10/03/2014 1838   CREATININE 0.92 12/28/2022 2003   CREATININE 1.03 10/03/2014 1838   CALCIUM 8.0 (L) 12/28/2022 2003   CALCIUM 9.0 10/03/2014 1838   GFRNONAA >60 12/28/2022 2003   GFRNONAA >60 10/03/2014 1838   GFRNONAA 58 (L) 04/16/2014 0525   GFRAA >60 04/28/2020 1342   GFRAA >60 10/03/2014 1838   GFRAA >60 04/16/2014 0525   CrCl cannot be calculated (Patient's most recent lab result is older than the maximum 21 days allowed.).  COAG Lab Results  Component Value Date   INR 1.1 09/11/2022   INR 1.0 09/27/2021   INR 0.95 10/29/2017    Radiology DG Knee Complete 4 Views Left  Result Date: 01/26/2023 CLINICAL DATA:  Left knee pain EXAM: LEFT KNEE - COMPLETE 4+ VIEW COMPARISON:  11/30/2022 FINDINGS: Frontal, bilateral oblique, lateral views of the left knee are obtained. No acute displaced fracture, subluxation, or dislocation. Three component left knee arthroplasty is in the expected position without evidence of complication. There is a small suprapatellar joint effusion, decreased since prior exam. Soft tissues are unremarkable. IMPRESSION: 1. Unremarkable left knee arthroplasty. 2. Decreased left knee effusion. 3. No acute displaced fracture. Electronically Signed   By: Sharlet Salina M.D.   On: 01/26/2023 22:52   [place] RT BREAST BX W LOC DEV 1ST LESION IMG BX SPEC US GUIDE  Addendum Date: 01/23/2023   ADDENDUM REPORT: 01/23/2023 08:11 ADDENDUM: PATHOLOGY revealed: BREAST, RIGHT AT 630 O'CLOCK, 9 CM FROM THE NIPPLE; ULTRASOUND-GUIDED CORE NEEDLE BIOPSY: BENIGN MAMMARY PARENCHYMA WITH DENSE FIBROSIS/SCARRING AND CHANGES COMPATIBLE WITH  PRIOR FAT NECROSIS. NEGATIVE FOR ATYPICAL PROLIFERATIVE BREAST DISEASE. Pathology results are CONCORDANT with imaging findings, per Dr. Jacob Moores. Pathology results and recommendations were discussed with patient via telephone on 01/20/23 Patient reported biopsy site doing well with no adverse symptoms, and only slight tenderness at the site. Post biopsy care instructions were reviewed, questions were answered and my direct phone number was provided. Patient was instructed to call [facility] for any additional questions or concerns related to biopsy site. RECOMMENDATION: Patient instructed to resume annual bilateral screening mammogram due May 2025. Pathology results reported by Hezzie Bump, RN 01/20/2023. Electronically Signed   By: Jacob Moores M.D.   On: 01/23/2023 08:11   Result Date: 01/23/2023 CLINICAL DATA:  57 year old female presenting for ultrasound-guided biopsy of a screen-detected indeterminate mass in the right breast EXAM: ULTRASOUND GUIDED RIGHT BREAST CORE NEEDLE BIOPSY COMPARISON:  Previous exam(s). PROCEDURE: I met with the patient and we discussed the procedure of ultrasound-guided biopsy, including benefits and alternatives. We discussed the high likelihood of a successful procedure. We discussed the risks of the procedure, including infection, bleeding, tissue injury, clip migration, and inadequate sampling. Informed written consent was given. The usual time-out protocol was performed immediately prior to the procedure. Lesion quadrant: Lower outer quadrant Using sterile technique and 1% Lidocaine as local anesthetic, under direct ultrasound visualization, a 14 gauge spring-loaded device was used to perform biopsy of a mass in the right breast 6:30 o'clock position using a lateral approach. At the conclusion of the procedure, a coil shaped tissue marker clip was deployed into  the biopsy cavity. Follow up 2 view mammogram was performed and dictated separately.  IMPRESSION: Ultrasound guided biopsy of a mass in the right breast 6:30 o'clock position. No apparent complications. Electronically Signed: By: Jacob Moores M.D. On: 01/18/2023 10:34   MM CLIP PLACEMENT RIGHT  Result Date: 01/18/2023 CLINICAL DATA:  Post-procedure mammogram EXAM: 3D DIAGNOSTIC RIGHT MAMMOGRAM POST ULTRASOUND BIOPSY COMPARISON:  Previous exam(s). FINDINGS: 3D Mammographic images were obtained following ultrasound guided biopsy of a mass in the right breast 6:30 o'clock position. The coil shaped biopsy marking clip is in expected position at the site of biopsy. IMPRESSION: Appropriate positioning of the coil shaped biopsy marking clip at the site of biopsy in the right breast 6:30 o'clock position. Final Assessment: Post Procedure Mammograms for Marker Placement Electronically Signed   By: Jacob Moores M.D.   On: 01/18/2023 10:35  MM 3D DIAGNOSTIC MAMMOGRAM UNILATERAL RIGHT BREAST  Result Date: 01/10/2023 CLINICAL DATA:  Callback for RIGHT breast mass. History of neuroendocrine cancer. EXAM: DIGITAL DIAGNOSTIC UNILATERAL RIGHT MAMMOGRAM WITH TOMOSYNTHESIS; ULTRASOUND RIGHT BREAST LIMITED TECHNIQUE: Right digital diagnostic mammography and breast tomosynthesis was performed.; Targeted ultrasound examination of the right breast was performed COMPARISON:  Previous exam(s). ACR Breast Density Category b: There are scattered areas of fibroglandular density. FINDINGS: Spot-compression tomosynthesis views confirms a persistent oval circumscribed mass in the RIGHT lower breast at posterior depth. Targeted ultrasound was performed of the RIGHT lower breast. At 6:30 9 cm from the nipple, there is a round taller than wide mildly hypoechoic mass. It measures 5 x 4 x 4 mm. This is favored to correspond to the site of screening mammographic concern. Targeted ultrasound was performed of the RIGHT axilla. No suspicious axillary lymph nodes are seen. IMPRESSION: 1. A 5 mm mass in the RIGHT breast is  indeterminate. Recommend ultrasound-guided biopsy for definitive characterization. 2. No suspicious RIGHT axillary adenopathy. RECOMMENDATION: RIGHT breast ultrasound-guided biopsy x1 I have discussed the findings and recommendations with the patient. The biopsy procedure was discussed with the patient and questions were answered. Patient expressed their understanding of the biopsy recommendation. Patient will be scheduled for biopsy at her earliest convenience by the schedulers. Ordering provider will be notified. If applicable, a reminder letter will be sent to the patient regarding the next appointment. BI-RADS CATEGORY  4: Suspicious. Electronically Signed   By: Meda Klinefelter M.D.   On: 01/10/2023 15:08  [place] LIMITED ULTRASOUND INCLUDING AXILLA RIGHT BREAST  Result Date: 01/10/2023 CLINICAL DATA:  Callback for RIGHT breast mass. History of neuroendocrine cancer. EXAM: DIGITAL DIAGNOSTIC UNILATERAL RIGHT MAMMOGRAM WITH TOMOSYNTHESIS; ULTRASOUND RIGHT BREAST LIMITED TECHNIQUE: Right digital diagnostic mammography and breast tomosynthesis was performed.; Targeted ultrasound examination of the right breast was performed COMPARISON:  Previous exam(s). ACR Breast Density Category b: There are scattered areas of fibroglandular density. FINDINGS: Spot-compression tomosynthesis views confirms a persistent oval circumscribed mass in the RIGHT lower breast at posterior depth. Targeted ultrasound was performed of the RIGHT lower breast. At 6:30 9 cm from the nipple, there is a round taller than wide mildly hypoechoic mass. It measures 5 x 4 x 4 mm. This is favored to correspond to the site of screening mammographic concern. Targeted ultrasound was performed of the RIGHT axilla. No suspicious axillary lymph nodes are seen. IMPRESSION: 1. A 5 mm mass in the RIGHT breast is indeterminate. Recommend ultrasound-guided biopsy for definitive characterization. 2. No suspicious RIGHT axillary adenopathy. RECOMMENDATION: RIGHT  breast ultrasound-guided biopsy x1 I have discussed the findings and recommendations with the  patient. The biopsy procedure was discussed with the patient and questions were answered. Patient expressed their understanding of the biopsy recommendation. Patient will be scheduled for biopsy at her earliest convenience by the schedulers. Ordering provider will be notified. If applicable, a reminder letter will be sent to the patient regarding the next appointment. BI-RADS CATEGORY  4: Suspicious. Electronically Signed   By: Meda Klinefelter M.D.   On: 01/10/2023 15:08    Assessment/Plan There are no diagnoses linked to this encounter.   Levora Dredge, MD  02/05/2023 2:15 PM

## 2023-02-05 NOTE — H&P (View-Only) (Signed)
       MRN : [id number]  Brittany Nguyen is a 57 y.o. (05/17/1966) female who presents with chief complaint of legs hurt and swell.  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  DVT was identified years ago and was treated with anticoagulation.  The presenting symptoms were pain and swelling in the lower extremity.   IVC filter was placed on 08/23/2022: Placement of a option Elite IVC filter    She is now s/p  Left total knee arthroplasty using computer-assisted navigation on 08/24/2022   Since her last visit there was the question of seizure activity and when brought to to  regional on December 01, 2022 she was also having significant left knee pain.  She was seen by Dr. Hooten at that time and it was felt that her knee pain was not orthopedic in nature likely more neurogenic.  More recently she was seen Jan 26, 2023 after she was pulled down some stairs by her dog and again was complaining of bilateral knee pain.   The patient has not been using compression therapy at this point.   No SOB or pleuritic chest pains.  No cough or hemoptysis.   No blood per rectum or blood in any sputum.  No excessive bruising per the patient.    No recent shortening of the patient's walking distance or new symptoms consistent with claudication.  No history of rest pain symptoms. No new ulcers or wounds of the lower extremities have occurred.  No outpatient medications have been marked as taking for the 02/06/23 encounter (Appointment) with [name], MD.    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   a.) Bx 02/22/2018 --> NE tumor cells --> AE1/AE3, chromogranin, synaptophysin (+); Ki-67 proliferation rate <1%; b.) repeat Bx 08/24/2018 --> well differentiard NET (G1)   Chronic, continuous use of opioids    a.) has naloxone Rx available   COPD (chronic obstructive pulmonary disease) (HCC)    Depression    Diet-controlled type 2 diabetes mellitus (HCC)    Diverticulitis 2010   DVT (deep venous thrombosis) (HCC)    Endometriosis 1990   Generalized epilepsy (HCC)    a.) on lacosamide; b.) last seizure 07-30-22 in setting of hypokalemia (K+ 2.6)   GERD (gastroesophageal reflux disease)    GIB (gastrointestinal bleeding) 01/08/2018   HFrEF (heart failure with reduced ejection fraction) (HCC)    a.)TTE 12/09/16: EF 25-30%, dif HK; b.) TTE 02/08/17: EF 25%, dif HK, mild TR, G1DD; c.) TTE 04/25/17: EF 30-35%, mild LVH; d.) TTE 01/30/18: EF 45%, MAC, LAE, mild MR, G1DD; e.) TTE 11/06/2018: EF 25%, sev glob HK, triv MR/TR/PR; f.) TTE 05/12/19: EF 35%, MAC, AoV sclerosis, G1DD; g.) TTE 11/05/19: EF 20%, sev glob HK, triv MR; h.) TTE 06/17/20: EF 40%, mod glob HK, triv MR; I:) TTE 06/14/22: EF 25-30%, G1DD   History of cardiac catheterization    a.) LHC 10/21/2014: normal coronaries; b.) R/LHC 02/24/2017: normal coronaries, mRA 12, mPA 28, mPCWP 15, CO 8.0, CI 3.8   History of kidney stones      Hx MRSA infection    Hypokalemia    Hypothyroidism    Iron deficiency anemia 03/27/2018   Lump or mass in breast 11/27/2012   a.) Bx 11/27/2012 --> apocrine wall cyst   Migraine    Nausea & vomiting    Neuropathy    Non-ischemic cardiomyopathy (HCC)    a.) TTE 12/09/2016: EF 25-30%, b.) TTE 02/08/2017: EF 25%; c.) Medtronic AICD placed 03/29/2017; d.) TTE 04/25/2017: EF 30-35%; e.) TTE 01/30/2018: EF 45%; f.) TTE 11/06/2018: EF 25%; g.) TTE 05/12/2019: EF 35%; h.) TTE 11/05/2019: EF 20%; I.) TTE 06/17/2020: EF 40%; J.) TTE 06/14/2022: EF 25-30%   Obesity    OSA on CPAP    PAF (paroxysmal atrial  fibrillation) (HCC)    PONV (postoperative nausea and vomiting)    Post-COVID chronic cough    Postcholecystectomy diarrhea    Prolonged Q-T interval on ECG    Restless leg    a.) on ropinirole   Unstable angina (HCC)     Past Surgical History:  Procedure Laterality Date   ABDOMINAL HYSTERECTOMY     age 40   BREAST BIOPSY Right 2014   benign   BREAST BIOPSY Right 01/18/2023   Us Core Bx, coil clip - path pending   BREAST BIOPSY Right 01/18/2023   US RT BREAST BX W LOC DEV 1ST LESION IMG BX SPEC US GUIDE 01/18/2023 [facility]   CARDIAC DEFIBRILLATOR PLACEMENT Left 03/29/2017   Procedure: MEDTRONIC CARDIAC DEFIBRILLATOR PLACEMENT (AICD); Location: [facility]; Surgeon: Venkata Narla, MD   CESAREAN SECTION     CHOLECYSTECTOMY     COLECTOMY     COLONOSCOPY WITH PROPOFOL N/A 02/22/2018   Procedure: COLONOSCOPY WITH PROPOFOL;  Surgeon: Toledo, Teodoro K, MD;  Location: [facility];  Service: Gastroenterology;  Laterality: N/A;   ESOPHAGOGASTRODUODENOSCOPY (EGD) WITH PROPOFOL N/A 02/22/2018   Procedure: ESOPHAGOGASTRODUODENOSCOPY (EGD) WITH PROPOFOL;  Surgeon: Toledo, Teodoro K, MD;  Location: [facility];  Service: Gastroenterology;  Laterality: N/A;   IVC FILTER INSERTION N/A 08/23/2022   Procedure: IVC FILTER INSERTION;  Surgeon: Alayah Knouff G, MD;  Location: [facility];  Service: Cardiovascular;  Laterality: N/A;   KNEE ARTHROPLASTY Left 08/24/2022   Procedure: COMPUTER ASSISTED TOTAL KNEE ARTHROPLASTY;  Surgeon: Hooten, James P, MD;  Location: [facility];  Service: Orthopedics;  Laterality: Left;   LEFT HEART CATH AND CORONARY ANGIOGRAPHY Left 10/21/2014   Procedure: LEFT HEART CATH AND CORONARY ANGIOGRAPHY; Location: [facility]; Surgeon: Bruce Kowalski, MD   NASAL SINUS SURGERY  2012   OOPHORECTOMY     RIGHT/LEFT HEART CATH AND CORONARY ANGIOGRAPHY Bilateral 02/24/2017   Procedure: RIGHT/LEFT HEART CATH AND CORONARY ANGIOGRAPHY; Location: [facility]   THYROIDECTOMY       Social History Social History   Tobacco Use   Smoking status: Never   Smokeless tobacco: Never  Vaping Use   Vaping Use: Never used  Substance Use Topics   Alcohol use: No   Drug use: Never    Family History Family History  Problem Relation Age of Onset   Breast cancer Mother    Cancer Mother 30       ovarian   CAD Mother    Cancer Father 80       brain   CAD Father    Cancer Daughter 18       skin   Breast cancer Maternal Aunt    Cancer Maternal Aunt 34       breast   Leukemia Paternal Grandfather    Breast cancer Cousin       Allergies  Allergen Reactions   Contrast Media [Iodinated Contrast Media] Shortness Of Breath    Per CT report in 2014 pt had break through contrast reaction with hives and SOB following 13 hour prep. MSY    Ferumoxytol Nausea Only   Iron Sucrose Anaphylaxis   Lidocaine Hives   Metrizamide Shortness Of Breath   Penicillins Hives and Other (See Comments)    IgE = 4 (WNL) on 08/12/2022  ediate rash, facial/tongue/throat swelling, SOB or lightheadedness with hypotension, tachy   Sacubitril-Valsartan     Sz (unclear if new start entresto vs hypoglycemia)   Isosorbide Nitrate Other (See Comments)    Headache   Latex Hives   Ondansetron Other (See Comments)    Severe headache   Tizanidine Other (See Comments)    Feels altered   Povidone-Iodine Rash    Blistering rash   Pulmicort [Budesonide] Itching     REVIEW OF SYSTEMS (Negative unless checked)  Constitutional: []Weight loss  []Fever  []Chills Cardiac: []Chest pain   []Chest pressure   []Palpitations   []Shortness of breath when laying flat   []Shortness of breath with exertion. Vascular:  []Pain in legs with walking   [x]Pain in legs at rest  []History of DVT   []Phlebitis   [x]Swelling in legs   []Varicose veins   []Non-healing ulcers Pulmonary:   []Uses home oxygen   []Productive cough   []Hemoptysis   []Wheeze  [x]COPD   []Asthma Neurologic:  []Dizziness   []Seizures    []History of stroke   []History of TIA  []Aphasia   []Vissual changes   []Weakness or numbness in arm   []Weakness or numbness in leg Musculoskeletal:   []Joint swelling   [x]Joint pain   []Low back pain Hematologic:  []Easy bruising  []Easy bleeding   []Hypercoagulable state   []Anemic Gastrointestinal:  []Diarrhea   []Vomiting  [x]Gastroesophageal reflux/heartburn   []Difficulty swallowing. Genitourinary:  []Chronic kidney disease   []Difficult urination  []Frequent urination   []Blood in urine Skin:  []Rashes   []Ulcers  Psychological:  [x]History of anxiety   [] History of major depression.  Physical Examination  There were no vitals filed for this visit. There is no height or weight on file to calculate BMI. Gen: WD/WN, NAD Head: [place]/AT, No temporalis wasting.  Ear/Nose/Throat: Hearing grossly intact, nares w/o erythema or drainage, pinna without lesions Eyes: PER, EOMI, sclera nonicteric.  Neck: Supple, no gross masses.  No JVD.  Pulmonary:  Good air movement, no audible wheezing, no use of accessory muscles.  Cardiac: RRR, precordium not hyperdynamic. Vascular:  scattered varicosities present bilaterally.  Moderate venous stasis changes to the legs bilaterally.  2+ soft pitting edema. CEAP C4sEpAsPr   Vessel Right Left  Radial Palpable Palpable  Gastrointestinal: soft, non-distended. No guarding/no peritoneal signs.  Musculoskeletal: M/S 5/5 throughout.  No deformity.  Neurologic: CN 2-12 intact. Pain and light touch intact in extremities.  Symmetrical.  Speech is fluent. Motor exam as listed above. Psychiatric: Judgment intact, Mood & affect appropriate for pt's clinical situation. Dermatologic: Venous rashes no ulcers noted.  No changes consistent with cellulitis. Lymph : No lichenification or skin changes of chronic lymphedema.  CBC Lab Results  Component Value Date   WBC 8.7 12/28/2022   HGB 12.5 12/28/2022   HCT 38.5 12/28/2022   MCV 87.5 12/28/2022   PLT 247 12/28/2022     BMET    Component Value Date/Time   NA 136 12/28/2022 2003   NA 139 10/03/2014   1838   K 3.3 (L) 12/28/2022 2003   K 3.9 10/03/2014 1838   CL 96 (L) 12/28/2022 2003   CL 106 10/03/2014 1838   CO2 29 12/28/2022 2003   CO2 28 10/03/2014 1838   GLUCOSE 128 (H) 12/28/2022 2003   GLUCOSE 118 (H) 10/03/2014 1838   BUN 14 12/28/2022 2003   BUN 11 10/03/2014 1838   CREATININE 0.92 12/28/2022 2003   CREATININE 1.03 10/03/2014 1838   CALCIUM 8.0 (L) 12/28/2022 2003   CALCIUM 9.0 10/03/2014 1838   GFRNONAA >60 12/28/2022 2003   GFRNONAA >60 10/03/2014 1838   GFRNONAA 58 (L) 04/16/2014 0525   GFRAA >60 04/28/2020 1342   GFRAA >60 10/03/2014 1838   GFRAA >60 04/16/2014 0525   CrCl cannot be calculated (Patient's most recent lab result is older than the maximum 21 days allowed.).  COAG Lab Results  Component Value Date   INR 1.1 09/11/2022   INR 1.0 09/27/2021   INR 0.95 10/29/2017    Radiology DG Knee Complete 4 Views Left  Result Date: 01/26/2023 CLINICAL DATA:  Left knee pain EXAM: LEFT KNEE - COMPLETE 4+ VIEW COMPARISON:  11/30/2022 FINDINGS: Frontal, bilateral oblique, lateral views of the left knee are obtained. No acute displaced fracture, subluxation, or dislocation. Three component left knee arthroplasty is in the expected position without evidence of complication. There is a small suprapatellar joint effusion, decreased since prior exam. Soft tissues are unremarkable. IMPRESSION: 1. Unremarkable left knee arthroplasty. 2. Decreased left knee effusion. 3. No acute displaced fracture. Electronically Signed   By: Michael  Brown M.D.   On: 01/26/2023 22:52   US RT BREAST BX W LOC DEV 1ST LESION IMG BX SPEC US GUIDE  Addendum Date: 01/23/2023   ADDENDUM REPORT: 01/23/2023 08:11 ADDENDUM: PATHOLOGY revealed: BREAST, RIGHT AT 630 O'CLOCK, 9 CM FROM THE NIPPLE; ULTRASOUND-GUIDED CORE NEEDLE BIOPSY: BENIGN MAMMARY PARENCHYMA WITH DENSE FIBROSIS/SCARRING AND CHANGES COMPATIBLE WITH  PRIOR FAT NECROSIS. NEGATIVE FOR ATYPICAL PROLIFERATIVE BREAST DISEASE. Pathology results are CONCORDANT with imaging findings, per Dr. Meghana Konanur. Pathology results and recommendations were discussed with patient via telephone on 01/20/23 Patient reported biopsy site doing well with no adverse symptoms, and only slight tenderness at the site. Post biopsy care instructions were reviewed, questions were answered and my direct phone number was provided. Patient was instructed to call [facility] for any additional questions or concerns related to biopsy site. RECOMMENDATION: Patient instructed to resume annual bilateral screening mammogram due May 2025. Pathology results reported by McKenzie Hamilton, RN 01/20/2023. Electronically Signed   By: Meghana  Konanur M.D.   On: 01/23/2023 08:11   Result Date: 01/23/2023 CLINICAL DATA:  56-year-old female presenting for ultrasound-guided biopsy of a screen-detected indeterminate mass in the right breast EXAM: ULTRASOUND GUIDED RIGHT BREAST CORE NEEDLE BIOPSY COMPARISON:  Previous exam(s). PROCEDURE: I met with the patient and we discussed the procedure of ultrasound-guided biopsy, including benefits and alternatives. We discussed the high likelihood of a successful procedure. We discussed the risks of the procedure, including infection, bleeding, tissue injury, clip migration, and inadequate sampling. Informed written consent was given. The usual time-out protocol was performed immediately prior to the procedure. Lesion quadrant: Lower outer quadrant Using sterile technique and 1% Lidocaine as local anesthetic, under direct ultrasound visualization, a 14 gauge spring-loaded device was used to perform biopsy of a mass in the right breast 6:30 o'clock position using a lateral approach. At the conclusion of the procedure, a coil shaped tissue marker clip was deployed into   the biopsy cavity. Follow up 2 view mammogram was performed and dictated separately.  IMPRESSION: Ultrasound guided biopsy of a mass in the right breast 6:30 o'clock position. No apparent complications. Electronically Signed: By: Meghana  Konanur M.D. On: 01/18/2023 10:34   MM CLIP PLACEMENT RIGHT  Result Date: 01/18/2023 CLINICAL DATA:  Post-procedure mammogram EXAM: 3D DIAGNOSTIC RIGHT MAMMOGRAM POST ULTRASOUND BIOPSY COMPARISON:  Previous exam(s). FINDINGS: 3D Mammographic images were obtained following ultrasound guided biopsy of a mass in the right breast 6:30 o'clock position. The coil shaped biopsy marking clip is in expected position at the site of biopsy. IMPRESSION: Appropriate positioning of the coil shaped biopsy marking clip at the site of biopsy in the right breast 6:30 o'clock position. Final Assessment: Post Procedure Mammograms for Marker Placement Electronically Signed   By: Meghana  Konanur M.D.   On: 01/18/2023 10:35  MM 3D DIAGNOSTIC MAMMOGRAM UNILATERAL RIGHT BREAST  Result Date: 01/10/2023 CLINICAL DATA:  Callback for RIGHT breast mass. History of neuroendocrine cancer. EXAM: DIGITAL DIAGNOSTIC UNILATERAL RIGHT MAMMOGRAM WITH TOMOSYNTHESIS; ULTRASOUND RIGHT BREAST LIMITED TECHNIQUE: Right digital diagnostic mammography and breast tomosynthesis was performed.; Targeted ultrasound examination of the right breast was performed COMPARISON:  Previous exam(s). ACR Breast Density Category b: There are scattered areas of fibroglandular density. FINDINGS: Spot-compression tomosynthesis views confirms a persistent oval circumscribed mass in the RIGHT lower breast at posterior depth. Targeted ultrasound was performed of the RIGHT lower breast. At 6:30 9 cm from the nipple, there is a round taller than wide mildly hypoechoic mass. It measures 5 x 4 x 4 mm. This is favored to correspond to the site of screening mammographic concern. Targeted ultrasound was performed of the RIGHT axilla. No suspicious axillary lymph nodes are seen. IMPRESSION: 1. A 5 mm mass in the RIGHT breast is  indeterminate. Recommend ultrasound-guided biopsy for definitive characterization. 2. No suspicious RIGHT axillary adenopathy. RECOMMENDATION: RIGHT breast ultrasound-guided biopsy x1 I have discussed the findings and recommendations with the patient. The biopsy procedure was discussed with the patient and questions were answered. Patient expressed their understanding of the biopsy recommendation. Patient will be scheduled for biopsy at her earliest convenience by the schedulers. Ordering provider will be notified. If applicable, a reminder letter will be sent to the patient regarding the next appointment. BI-RADS CATEGORY  4: Suspicious. Electronically Signed   By: Stephanie  Peacock M.D.   On: 01/10/2023 15:08  US LIMITED ULTRASOUND INCLUDING AXILLA RIGHT BREAST  Result Date: 01/10/2023 CLINICAL DATA:  Callback for RIGHT breast mass. History of neuroendocrine cancer. EXAM: DIGITAL DIAGNOSTIC UNILATERAL RIGHT MAMMOGRAM WITH TOMOSYNTHESIS; ULTRASOUND RIGHT BREAST LIMITED TECHNIQUE: Right digital diagnostic mammography and breast tomosynthesis was performed.; Targeted ultrasound examination of the right breast was performed COMPARISON:  Previous exam(s). ACR Breast Density Category b: There are scattered areas of fibroglandular density. FINDINGS: Spot-compression tomosynthesis views confirms a persistent oval circumscribed mass in the RIGHT lower breast at posterior depth. Targeted ultrasound was performed of the RIGHT lower breast. At 6:30 9 cm from the nipple, there is a round taller than wide mildly hypoechoic mass. It measures 5 x 4 x 4 mm. This is favored to correspond to the site of screening mammographic concern. Targeted ultrasound was performed of the RIGHT axilla. No suspicious axillary lymph nodes are seen. IMPRESSION: 1. A 5 mm mass in the RIGHT breast is indeterminate. Recommend ultrasound-guided biopsy for definitive characterization. 2. No suspicious RIGHT axillary adenopathy. RECOMMENDATION: RIGHT  breast ultrasound-guided biopsy x1 I have discussed the findings and recommendations with the   patient. The biopsy procedure was discussed with the patient and questions were answered. Patient expressed their understanding of the biopsy recommendation. Patient will be scheduled for biopsy at her earliest convenience by the schedulers. Ordering provider will be notified. If applicable, a reminder letter will be sent to the patient regarding the next appointment. BI-RADS CATEGORY  4: Suspicious. Electronically Signed   By: Stephanie  Peacock M.D.   On: 01/10/2023 15:08    Assessment/Plan There are no diagnoses linked to this encounter.   Bertrum Helmstetter, MD  02/05/2023 2:15 PM   

## 2023-02-06 ENCOUNTER — Ambulatory Visit (INDEPENDENT_AMBULATORY_CARE_PROVIDER_SITE_OTHER): Payer: Medicare HMO | Admitting: Vascular Surgery

## 2023-02-06 ENCOUNTER — Encounter (INDEPENDENT_AMBULATORY_CARE_PROVIDER_SITE_OTHER): Payer: Self-pay | Admitting: Vascular Surgery

## 2023-02-06 VITALS — BP 132/93 | HR 81 | Resp 16 | Wt 213.2 lb

## 2023-02-06 DIAGNOSIS — I25119 Atherosclerotic heart disease of native coronary artery with unspecified angina pectoris: Secondary | ICD-10-CM | POA: Diagnosis not present

## 2023-02-06 DIAGNOSIS — M79661 Pain in right lower leg: Secondary | ICD-10-CM

## 2023-02-06 DIAGNOSIS — M7989 Other specified soft tissue disorders: Secondary | ICD-10-CM | POA: Diagnosis not present

## 2023-02-06 DIAGNOSIS — M170 Bilateral primary osteoarthritis of knee: Secondary | ICD-10-CM

## 2023-02-06 DIAGNOSIS — J449 Chronic obstructive pulmonary disease, unspecified: Secondary | ICD-10-CM | POA: Diagnosis not present

## 2023-02-06 DIAGNOSIS — E119 Type 2 diabetes mellitus without complications: Secondary | ICD-10-CM

## 2023-02-06 DIAGNOSIS — M79662 Pain in left lower leg: Secondary | ICD-10-CM

## 2023-02-06 DIAGNOSIS — Z86718 Personal history of other venous thrombosis and embolism: Secondary | ICD-10-CM | POA: Diagnosis not present

## 2023-02-15 DIAGNOSIS — Z79890 Hormone replacement therapy (postmenopausal): Secondary | ICD-10-CM | POA: Diagnosis not present

## 2023-02-15 DIAGNOSIS — I5023 Acute on chronic systolic (congestive) heart failure: Secondary | ICD-10-CM | POA: Diagnosis not present

## 2023-02-15 DIAGNOSIS — G4733 Obstructive sleep apnea (adult) (pediatric): Secondary | ICD-10-CM | POA: Diagnosis not present

## 2023-02-15 DIAGNOSIS — Z6834 Body mass index (BMI) 34.0-34.9, adult: Secondary | ICD-10-CM | POA: Diagnosis not present

## 2023-02-15 DIAGNOSIS — I5022 Chronic systolic (congestive) heart failure: Secondary | ICD-10-CM | POA: Diagnosis not present

## 2023-02-15 DIAGNOSIS — Z885 Allergy status to narcotic agent status: Secondary | ICD-10-CM | POA: Diagnosis not present

## 2023-02-15 DIAGNOSIS — E669 Obesity, unspecified: Secondary | ICD-10-CM | POA: Diagnosis not present

## 2023-02-15 DIAGNOSIS — I110 Hypertensive heart disease with heart failure: Secondary | ICD-10-CM | POA: Diagnosis not present

## 2023-02-15 DIAGNOSIS — Z86718 Personal history of other venous thrombosis and embolism: Secondary | ICD-10-CM | POA: Diagnosis not present

## 2023-02-15 DIAGNOSIS — Z9989 Dependence on other enabling machines and devices: Secondary | ICD-10-CM | POA: Diagnosis not present

## 2023-02-22 ENCOUNTER — Encounter: Payer: Self-pay | Admitting: Oncology

## 2023-02-23 ENCOUNTER — Telehealth (INDEPENDENT_AMBULATORY_CARE_PROVIDER_SITE_OTHER): Payer: Self-pay

## 2023-02-23 NOTE — Telephone Encounter (Signed)
I attempted to contact the patient to schedule a IVC filter removal with Dr. Gilda Crease. A message was left for a return call.

## 2023-02-27 DIAGNOSIS — E890 Postprocedural hypothyroidism: Secondary | ICD-10-CM | POA: Diagnosis not present

## 2023-02-27 DIAGNOSIS — K529 Noninfective gastroenteritis and colitis, unspecified: Secondary | ICD-10-CM | POA: Diagnosis not present

## 2023-02-27 DIAGNOSIS — G893 Neoplasm related pain (acute) (chronic): Secondary | ICD-10-CM | POA: Diagnosis not present

## 2023-02-27 DIAGNOSIS — G2581 Restless legs syndrome: Secondary | ICD-10-CM | POA: Diagnosis not present

## 2023-02-27 DIAGNOSIS — C7A8 Other malignant neuroendocrine tumors: Secondary | ICD-10-CM | POA: Diagnosis not present

## 2023-02-27 DIAGNOSIS — Z96652 Presence of left artificial knee joint: Secondary | ICD-10-CM | POA: Diagnosis not present

## 2023-02-27 DIAGNOSIS — R112 Nausea with vomiting, unspecified: Secondary | ICD-10-CM | POA: Diagnosis not present

## 2023-02-27 DIAGNOSIS — R569 Unspecified convulsions: Secondary | ICD-10-CM | POA: Diagnosis not present

## 2023-02-27 DIAGNOSIS — C7A092 Malignant carcinoid tumor of the stomach: Secondary | ICD-10-CM | POA: Diagnosis not present

## 2023-02-27 DIAGNOSIS — R109 Unspecified abdominal pain: Secondary | ICD-10-CM | POA: Diagnosis not present

## 2023-02-27 DIAGNOSIS — K219 Gastro-esophageal reflux disease without esophagitis: Secondary | ICD-10-CM | POA: Diagnosis not present

## 2023-02-27 DIAGNOSIS — G8929 Other chronic pain: Secondary | ICD-10-CM | POA: Diagnosis not present

## 2023-03-01 ENCOUNTER — Telehealth (INDEPENDENT_AMBULATORY_CARE_PROVIDER_SITE_OTHER): Payer: Self-pay

## 2023-03-01 NOTE — Telephone Encounter (Signed)
Spoke with the patient and she is scheduled with Dr. Gilda Crease on 03/07/23 with a 9:00 am arrival time to the [facility] for a IVC filter removal. Pre-procedure instructions were discussed and will be sent to Mychart and mailed.

## 2023-03-02 IMAGING — NM NM PULMONARY PERF PARTICULATE
1 series · 8 of 8 positions shown · non-contrast
Comparison: None.

CLINICAL DATA: Pulmonary embolism suspected

EXAM:
NUCLEAR MEDICINE PERFUSION LUNG SCAN
TECHNIQUE: Perfusion images were obtained in multiple projections after
intravenous injection of radiopharmaceutical.
Ventilation scans intentionally deferred if perfusion scan and chest
x-ray adequate for interpretation during COVID 19 epidemic.
RADIOPHARMACEUTICALS:  4.25 mCi Jc-44m MAA IV

[Series 1000: lung perfusion · 1.65mm/px · 4 acquisitions, 8 frames shown]
[im 1/4]
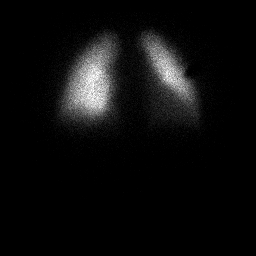
[im 1/4]
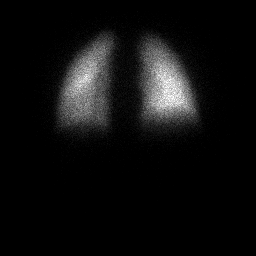
[im 2/4]
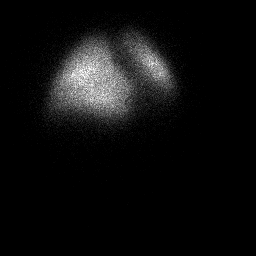
[im 2/4]
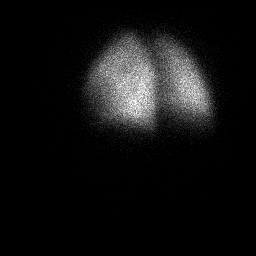
[im 3/4  full-range]
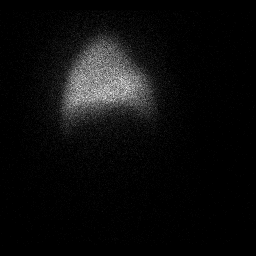
[im 3/4  full-range]
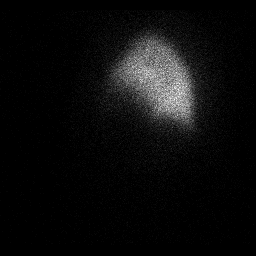
[im 4/4]
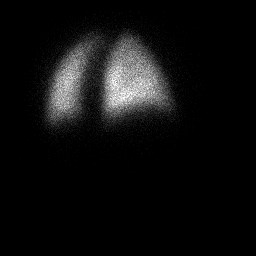
[im 4/4]
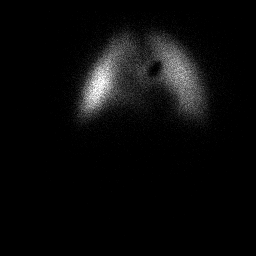

[8 of 8 positions shown; findings below may reference images not displayed]

FINDINGS: No segmental perfusion defects. Pacer defect overlying the left
hemithorax.
IMPRESSION: Pulmonary embolism absent (normal)

## 2023-03-02 IMAGING — CT CT ABD-PELV W/O CM
2 of 4 series · 17 of 46 positions shown, 19 images · non-contrast
Comparison: CT abdomen and pelvis dated June 22, 2021

CLINICAL DATA: Body aches and vomiting, recent endoscopy



[Series 2: ap without · axial · non-contrast · 0.80mm/px · z∈[+982,+1357]mm · 14 of 86 slices shown, 16 images]
[im 6/86  soft-tissue]
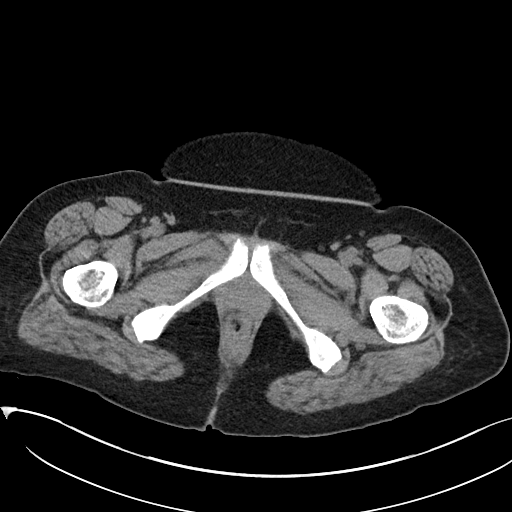
[im 6/86  bone]
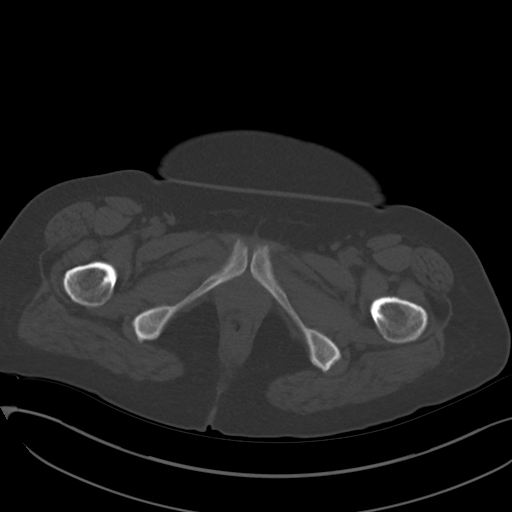
[im 11/86  soft-tissue]
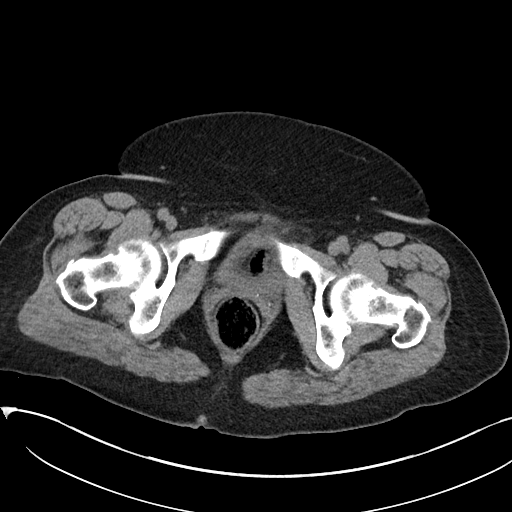
[im 16/86  soft-tissue]
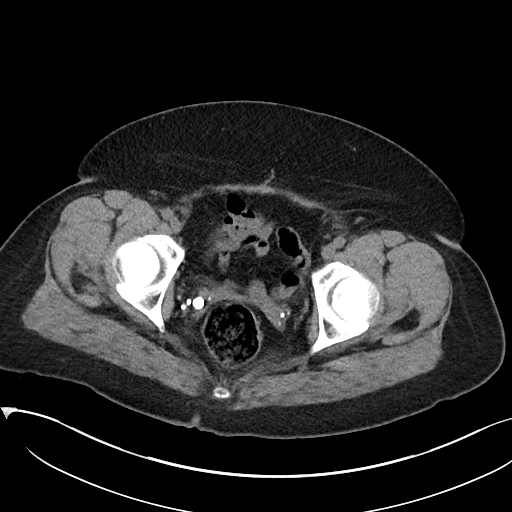
[im 26/86  soft-tissue]
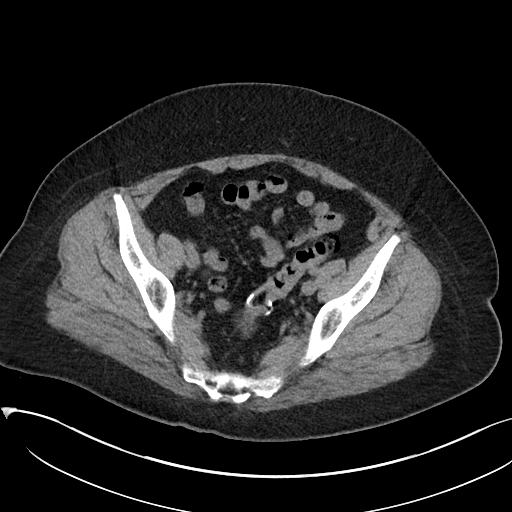
[im 31/86  soft-tissue]
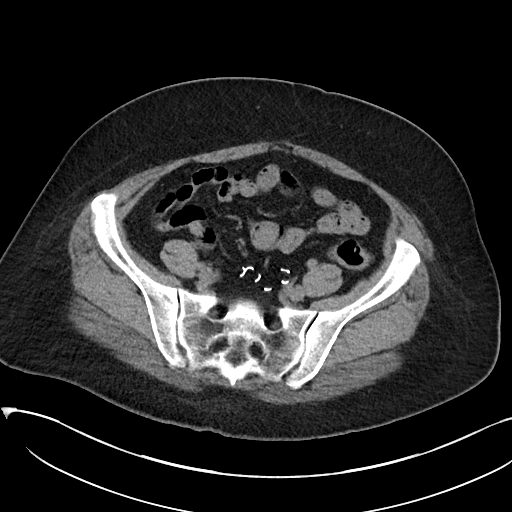
[im 36/86  soft-tissue]
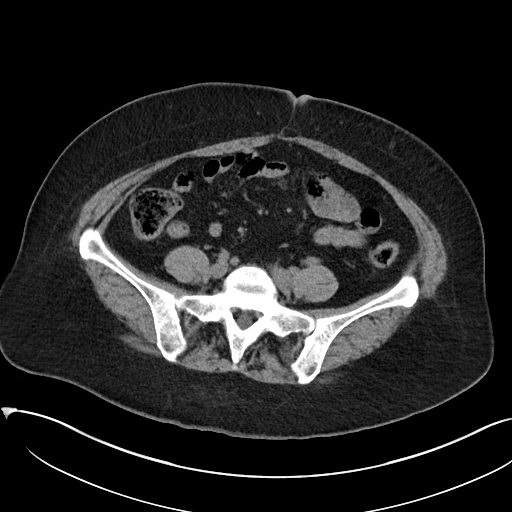
[im 41/86  soft-tissue]
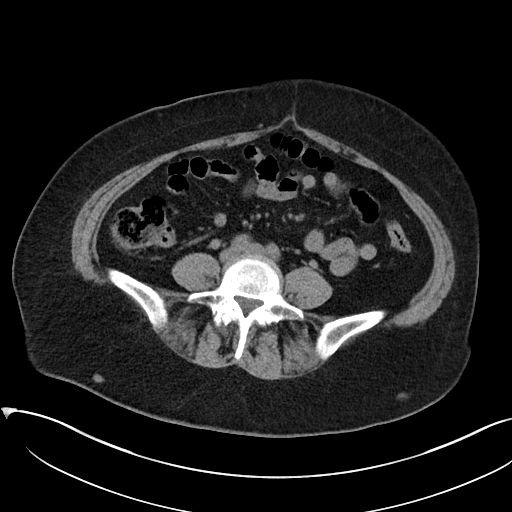
[im 46/86  soft-tissue]
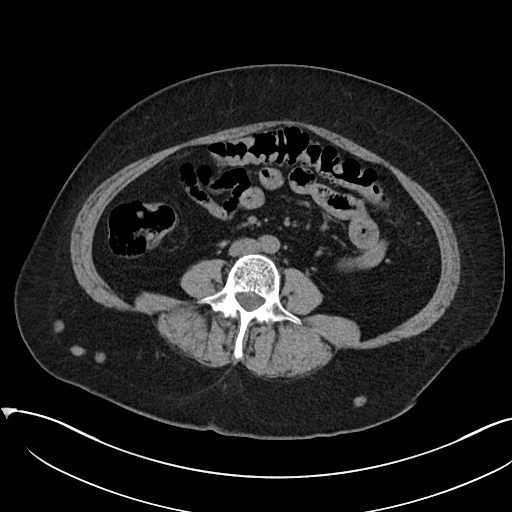
[im 51/86  soft-tissue]
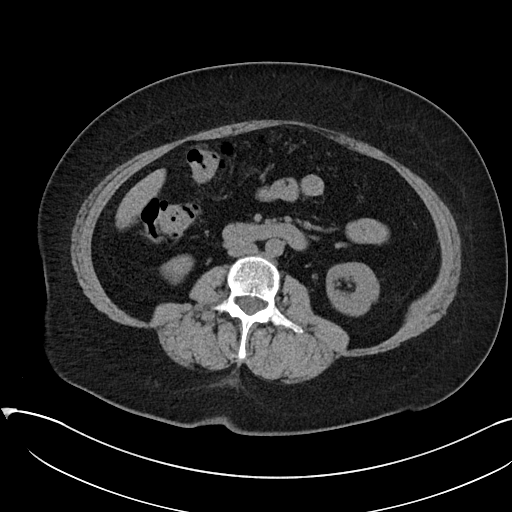
[im 51/86  bone]
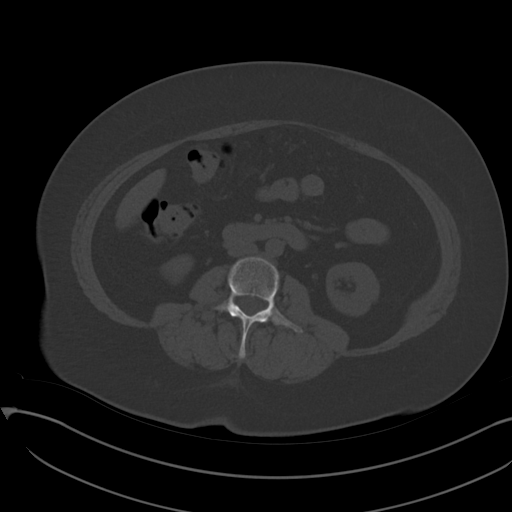
[im 56/86  soft-tissue]
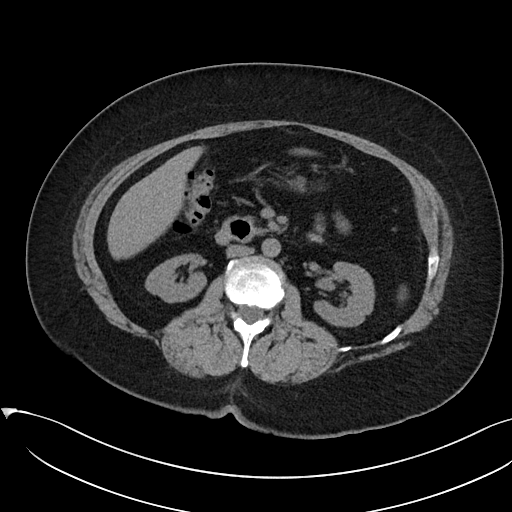
[im 66/86  soft-tissue]
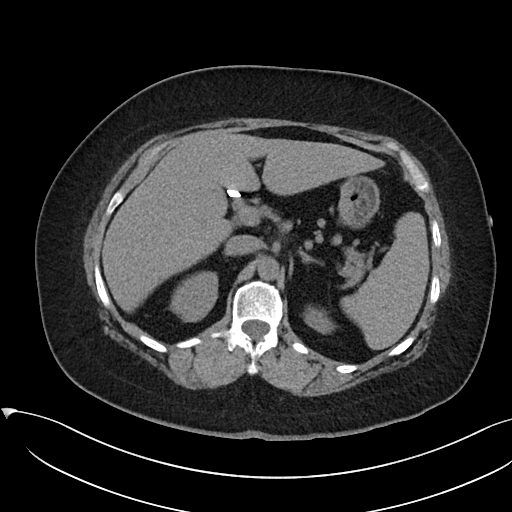
[im 71/86  soft-tissue]
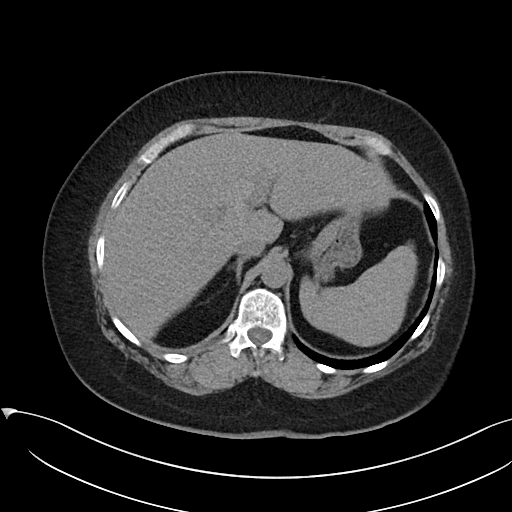
[im 76/86  soft-tissue]
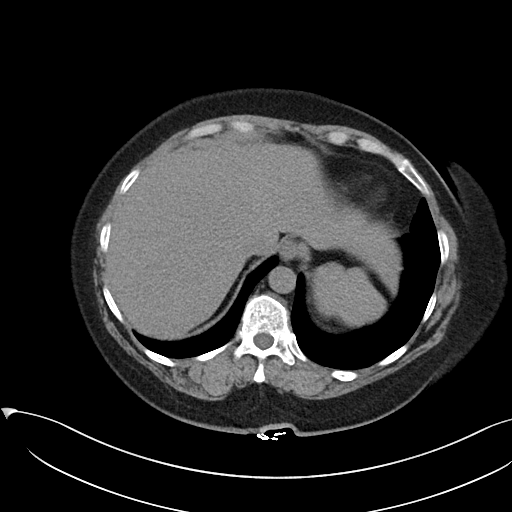
[im 81/86  soft-tissue]
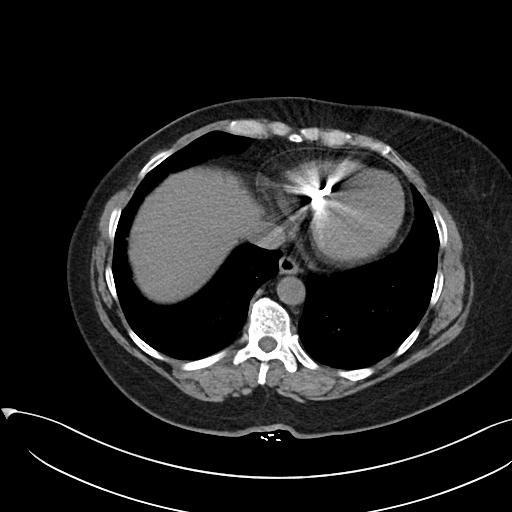

[Series 5: cor · coronal · 0.86mm/px · 3 of 103 slices shown]
[im 35/103  soft-tissue]
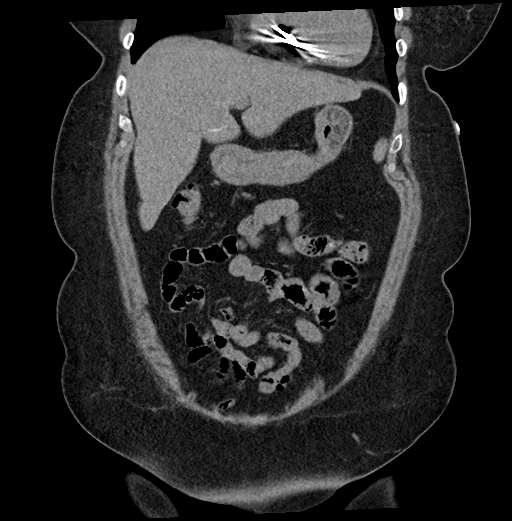
[im 46/103  soft-tissue]
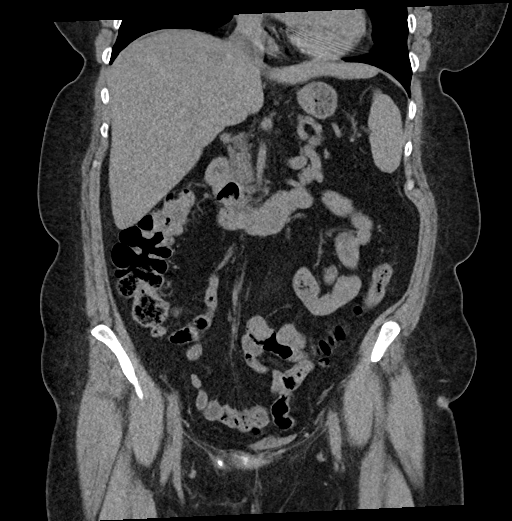
[im 57/103  soft-tissue]
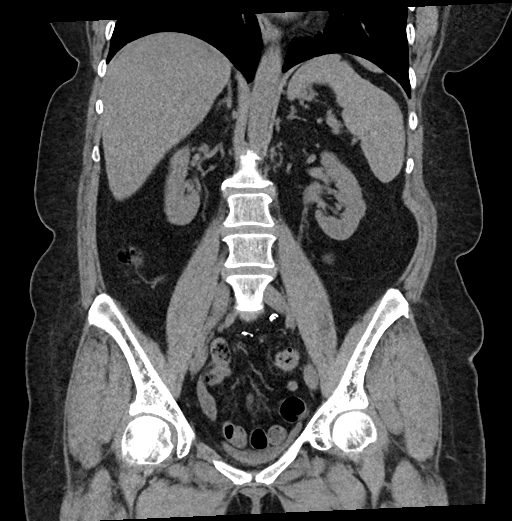

[17 of 46 positions shown; findings below may reference images not displayed]

FINDINGS: Lower chest: Visualized lungs are clear. Partially visualized pacer
lead with tip in the right atrium.

Hepatobiliary: No focal liver abnormality is seen. Status post
cholecystectomy. No biliary dilatation.

Pancreas: Unremarkable. No pancreatic ductal dilatation or
surrounding inflammatory changes.

Spleen: Normal in size without focal abnormality.

Adrenals/Urinary Tract: Adrenal glands are unremarkable. Kidneys are
normal, without renal calculi, focal lesion, or hydronephrosis.
Bladder is decompressed.

Stomach/Bowel: Incidental note is made of a lipoma in the gastric
body, stomach is otherwise unremarkable. Prior partial sigmoid colon
resection. Small duodenal diverticulum. Appendix not visualized,
although there are no secondary findings of acute appendicitis. No
bowel wall thickening, inflammatory change or evidence of
obstruction.

Vascular/Lymphatic: No significant vascular findings are present. No
enlarged abdominal or pelvic lymph nodes.

Reproductive: Status post hysterectomy. No adnexal masses.

Other: No abdominal wall hernia or abnormality. No abdominopelvic
ascites.

Musculoskeletal: No acute or significant osseous findings.
IMPRESSION: No acute findings in the abdomen or pelvis.

## 2023-03-06 DIAGNOSIS — Z4502 Encounter for adjustment and management of automatic implantable cardiac defibrillator: Secondary | ICD-10-CM | POA: Diagnosis not present

## 2023-03-06 DIAGNOSIS — I5042 Chronic combined systolic (congestive) and diastolic (congestive) heart failure: Secondary | ICD-10-CM | POA: Diagnosis not present

## 2023-03-07 ENCOUNTER — Encounter
Admission: RE | Disposition: A | Discharge status: Home / Self Care | Payer: Self-pay | Source: Home / Self Care | Attending: Vascular Surgery

## 2023-03-07 ENCOUNTER — Encounter: Payer: Self-pay | Admitting: Vascular Surgery

## 2023-03-07 ENCOUNTER — Ambulatory Visit
Admission: RE | Admit: 2023-03-07 | Discharge: 2023-03-07 | Disposition: A | Discharge status: Home / Self Care | Payer: Medicare HMO | Attending: Vascular Surgery | Admitting: Vascular Surgery

## 2023-03-07 ENCOUNTER — Other Ambulatory Visit: Payer: Self-pay

## 2023-03-07 DIAGNOSIS — Z86711 Personal history of pulmonary embolism: Secondary | ICD-10-CM | POA: Diagnosis not present

## 2023-03-07 DIAGNOSIS — Z4589 Encounter for adjustment and management of other implanted devices: Secondary | ICD-10-CM | POA: Diagnosis not present

## 2023-03-07 DIAGNOSIS — E119 Type 2 diabetes mellitus without complications: Secondary | ICD-10-CM | POA: Insufficient documentation

## 2023-03-07 DIAGNOSIS — Z86718 Personal history of other venous thrombosis and embolism: Secondary | ICD-10-CM

## 2023-03-07 DIAGNOSIS — I25119 Atherosclerotic heart disease of native coronary artery with unspecified angina pectoris: Secondary | ICD-10-CM | POA: Insufficient documentation

## 2023-03-07 DIAGNOSIS — Z7901 Long term (current) use of anticoagulants: Secondary | ICD-10-CM

## 2023-03-07 DIAGNOSIS — T82898A Other specified complication of vascular prosthetic devices, implants and grafts, initial encounter: Secondary | ICD-10-CM

## 2023-03-07 DIAGNOSIS — M170 Bilateral primary osteoarthritis of knee: Secondary | ICD-10-CM | POA: Insufficient documentation

## 2023-03-07 DIAGNOSIS — I82409 Acute embolism and thrombosis of unspecified deep veins of unspecified lower extremity: Secondary | ICD-10-CM

## 2023-03-07 DIAGNOSIS — J449 Chronic obstructive pulmonary disease, unspecified: Secondary | ICD-10-CM | POA: Diagnosis not present

## 2023-03-07 HISTORY — PX: IVC FILTER REMOVAL: CATH118246

## 2023-03-07 SURGERY — IVC FILTER REMOVAL
Anesthesia: Moderate Sedation

## 2023-03-07 MED ORDER — FAMOTIDINE 20 MG PO TABS
40.0000 mg | ORAL_TABLET | Freq: Once | ORAL | Status: AC | PRN
  Administered 2023-03-07: 40 mg via ORAL

## 2023-03-07 MED ORDER — METHYLPREDNISOLONE SODIUM SUCC 125 MG IJ SOLR
INTRAMUSCULAR | Status: AC
  Filled 2023-03-07: qty 2

## 2023-03-07 MED ORDER — IODIXANOL 320 MG/ML IV SOLN
INTRAVENOUS | Status: DC | PRN
  Administered 2023-03-07: 20 mL via INTRAVENOUS

## 2023-03-07 MED ORDER — CHLOROPROCAINE HCL 2 % IJ SOLN
INTRAMUSCULAR | Status: DC | PRN
  Administered 2023-03-07: 10 mL

## 2023-03-07 MED ORDER — HEPARIN (PORCINE) IN NACL 1000-0.9 UT/500ML-% IV SOLN
INTRAVENOUS | Status: DC | PRN
  Administered 2023-03-07: 500 mL

## 2023-03-07 MED ORDER — SODIUM CHLORIDE 0.9 % IV SOLN: INTRAVENOUS | Status: DC

## 2023-03-07 MED ORDER — DIPHENHYDRAMINE HCL 50 MG/ML IJ SOLN
INTRAMUSCULAR | Status: AC
  Filled 2023-03-07: qty 1

## 2023-03-07 MED ORDER — FAMOTIDINE 20 MG PO TABS
ORAL_TABLET | ORAL | Status: AC
  Filled 2023-03-07: qty 2

## 2023-03-07 MED ORDER — SODIUM CHLORIDE 0.9 % IV SOLN
12.5000 mg | Freq: Once | INTRAVENOUS | Status: AC
  Administered 2023-03-07: 12.5 mg via INTRAVENOUS
  Filled 2023-03-07: qty 12.5

## 2023-03-07 MED ORDER — MIDAZOLAM HCL 2 MG/ML PO SYRP: 8.0000 mg | ORAL_SOLUTION | Freq: Once | ORAL | Status: DC | PRN

## 2023-03-07 MED ORDER — CEFAZOLIN SODIUM-DEXTROSE 2-4 GM/100ML-% IV SOLN: 2.0000 g | INTRAVENOUS | Status: DC

## 2023-03-07 MED ORDER — FENTANYL CITRATE PF 50 MCG/ML IJ SOSY
PREFILLED_SYRINGE | INTRAMUSCULAR | Status: AC
  Filled 2023-03-07: qty 1

## 2023-03-07 MED ORDER — FENTANYL CITRATE (PF) 100 MCG/2ML IJ SOLN
INTRAMUSCULAR | Status: DC | PRN
  Administered 2023-03-07 (×2): 25 ug via INTRAVENOUS
  Administered 2023-03-07: 50 ug via INTRAVENOUS

## 2023-03-07 MED ORDER — VANCOMYCIN HCL IN DEXTROSE 1-5 GM/200ML-% IV SOLN
INTRAVENOUS | Status: DC | PRN
  Administered 2023-03-07: 1000 mg via INTRAVENOUS

## 2023-03-07 MED ORDER — MIDAZOLAM HCL 2 MG/2ML IJ SOLN
INTRAMUSCULAR | Status: AC
  Filled 2023-03-07: qty 2

## 2023-03-07 MED ORDER — VANCOMYCIN HCL IN DEXTROSE 1-5 GM/200ML-% IV SOLN: 1000.0000 mg | Freq: Once | INTRAVENOUS | Status: DC

## 2023-03-07 MED ORDER — HYDROMORPHONE HCL 1 MG/ML IJ SOLN: 1.0000 mg | Freq: Once | INTRAMUSCULAR | Status: DC | PRN

## 2023-03-07 MED ORDER — METHYLPREDNISOLONE SODIUM SUCC 125 MG IJ SOLR
125.0000 mg | Freq: Once | INTRAMUSCULAR | Status: AC | PRN
  Administered 2023-03-07: 125 mg via INTRAVENOUS

## 2023-03-07 MED ORDER — VANCOMYCIN HCL IN DEXTROSE 1-5 GM/200ML-% IV SOLN
INTRAVENOUS | Status: AC
  Filled 2023-03-07: qty 200

## 2023-03-07 MED ORDER — MIDAZOLAM HCL 2 MG/2ML IJ SOLN
INTRAMUSCULAR | Status: DC | PRN
  Administered 2023-03-07: 2 mg via INTRAVENOUS
  Administered 2023-03-07 (×2): 1 mg via INTRAVENOUS

## 2023-03-07 MED ORDER — DIPHENHYDRAMINE HCL 50 MG/ML IJ SOLN
50.0000 mg | Freq: Once | INTRAMUSCULAR | Status: AC | PRN
  Administered 2023-03-07: 50 mg via INTRAVENOUS

## 2023-03-07 SURGICAL SUPPLY — 9 items
COVER PROBE ULTRASOUND 5X96 (MISCELLANEOUS) IMPLANT
DEVICE ENSNARE 12MMX20MM (VASCULAR PRODUCTS) IMPLANT
KIT SNARE RETRIEVAL (KITS) IMPLANT
NDL ENTRY 21GA 7CM ECHOTIP (NEEDLE) IMPLANT
NEEDLE ENTRY 21GA 7CM ECHOTIP (NEEDLE) ×1 IMPLANT
PACK ANGIOGRAPHY (CUSTOM PROCEDURE TRAY) ×1 IMPLANT
SET CLOVERSNARE FLT RETRIEVAL (MISCELLANEOUS) IMPLANT
SET INTRO CAPELLA COAXIAL (SET/KITS/TRAYS/PACK) IMPLANT
WIRE SUPRACORE 190CM (WIRE) IMPLANT

## 2023-03-07 NOTE — Interval H&P Note (Signed)
History and Physical Interval Note:  03/07/2023 12:23 PM  Brittany Nguyen  has presented today for surgery, with the diagnosis of IVC filter removal   DVT.  The various methods of treatment have been discussed with the patient and family. After consideration of risks, benefits and other options for treatment, the patient has consented to  Procedure(s): IVC FILTER REMOVAL (N/A) as a surgical intervention.  The patient's history has been reviewed, patient examined, no change in status, stable for surgery.  I have reviewed the patient's chart and labs.  Questions were answered to the patient's satisfaction.     Levora Dredge

## 2023-03-07 NOTE — Op Note (Signed)
  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  ANESTHESIA:  Conscious sedation was administered under my direct supervision by the interventional radiology RN. IV Versed plus fentanyl were utilized. Continuous ECG, pulse oximetry and blood pressure was monitored throughout the entire procedure. Conscious sedation was for a total of 40 minutes.  ESTIMATED BLOOD LOSS: Minimal cc  FINDING(S):inferior vena cava is widely patent filter is in place in good position but the hook appears to be tilted backward against the posterior wall.   SPECIMEN(S): None   INDICATIONS:   Brittany Nguyen is a 57 y.o. female who presents with DVT and PE. The patient has now tolerated anticoagulation for several months. Therefore, the IVC filter is recommended to be removed. The risks and benefits were reviewed with the patient all questions were answered and they agreed to proceed with IVC filter retrieval. Oral anticoagulation will be continued.  DESCRIPTION: After obtaining full informed written consent, the patient was brought back to the Special Procedure Suite and placed in the supine position.  The patient received IV antibiotics prior to induction.  After obtaining adequate sedation, the patient was prepped and draped in the standard fashion and appropriate time out is called.     Ultrasound was placed in a sterile sleeve.The right neck was then imaged with ultrasound.   Jugular vein was identified it is echolucent and homogeneous indicating patency. 1% lidocaine is then infiltrated under ultrasound visualization and subsequently a Seldinger needle is inserted under real-time ultrasound guidance.  J-wire is then advanced into the inferior vena cava under fluoroscopic guidance. With the tip of the  sheath at the confluence of the iliac veins inferior vena caval imaging is performed.  After review of the image the sheath is repositioned to above the filter and the snares introduced. Snares opened and multiple attempts at grasping the hook are made.  However on repositioning the detector and doing steep obliques both from the RAO and LAO it does appear that the filter is leading posterior the stairs are sliding over the wall right across the hook and then making contact with the shoulders of the filter.  Does not appear that this filter can be removed by simply staring the hook and therefore the case was terminated and discussions about advanced filter removal techniques will be made with the patient at her follow-up visit  Sheath is removed by pressures held the patient tolerated the procedure well and there were no immediate complications.  Interpretation: inferior vena cava is widely patent filter is in place in good position. Filter is in good position but the hook appears to have endothelialized.     COMPLICATIONS: None  CONDITION: [name], M.D. [facility] Office: [phone]   03/07/2023, 1:40 PM

## 2023-03-07 NOTE — Progress Notes (Signed)
PT up and ambulated to restroom without difficulty. PT maintaining sats <94% after ambulation and site stable.

## 2023-03-08 ENCOUNTER — Emergency Department
Admission: EM | Admit: 2023-03-08 | Discharge: 2023-03-08 | Disposition: A | Discharge status: Home / Self Care | Payer: Medicare HMO | Attending: Emergency Medicine | Admitting: Emergency Medicine

## 2023-03-08 ENCOUNTER — Encounter: Payer: Self-pay | Admitting: Vascular Surgery

## 2023-03-08 ENCOUNTER — Other Ambulatory Visit: Payer: Self-pay

## 2023-03-08 ENCOUNTER — Emergency Department: Payer: Medicare HMO

## 2023-03-08 ENCOUNTER — Encounter: Payer: Self-pay | Admitting: Oncology

## 2023-03-08 DIAGNOSIS — M791 Myalgia, unspecified site: Secondary | ICD-10-CM | POA: Insufficient documentation

## 2023-03-08 DIAGNOSIS — I110 Hypertensive heart disease with heart failure: Secondary | ICD-10-CM | POA: Diagnosis not present

## 2023-03-08 DIAGNOSIS — J449 Chronic obstructive pulmonary disease, unspecified: Secondary | ICD-10-CM | POA: Insufficient documentation

## 2023-03-08 DIAGNOSIS — Y92410 Unspecified street and highway as the place of occurrence of the external cause: Secondary | ICD-10-CM | POA: Diagnosis not present

## 2023-03-08 DIAGNOSIS — E039 Hypothyroidism, unspecified: Secondary | ICD-10-CM | POA: Insufficient documentation

## 2023-03-08 DIAGNOSIS — E119 Type 2 diabetes mellitus without complications: Secondary | ICD-10-CM | POA: Insufficient documentation

## 2023-03-08 DIAGNOSIS — S299XXA Unspecified injury of thorax, initial encounter: Secondary | ICD-10-CM | POA: Diagnosis not present

## 2023-03-08 DIAGNOSIS — Z041 Encounter for examination and observation following transport accident: Secondary | ICD-10-CM | POA: Diagnosis not present

## 2023-03-08 DIAGNOSIS — I509 Heart failure, unspecified: Secondary | ICD-10-CM | POA: Insufficient documentation

## 2023-03-08 DIAGNOSIS — R079 Chest pain, unspecified: Secondary | ICD-10-CM | POA: Diagnosis not present

## 2023-03-08 DIAGNOSIS — R519 Headache, unspecified: Secondary | ICD-10-CM | POA: Diagnosis not present

## 2023-03-08 DIAGNOSIS — M25512 Pain in left shoulder: Secondary | ICD-10-CM | POA: Diagnosis not present

## 2023-03-08 DIAGNOSIS — M7918 Myalgia, other site: Secondary | ICD-10-CM

## 2023-03-08 MED ORDER — OXYCODONE-ACETAMINOPHEN 5-325 MG PO TABS
1.0000 | ORAL_TABLET | Freq: Once | ORAL | Status: AC
  Administered 2023-03-08: 1 via ORAL
  Filled 2023-03-08: qty 1

## 2023-03-08 MED ORDER — OXYCODONE-ACETAMINOPHEN 5-325 MG PO TABS: 1.0000 | ORAL_TABLET | Freq: Four times a day (QID) | ORAL | 0 refills | Status: DC | PRN
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
## 2023-03-08 NOTE — Discharge Instructions (Signed)
Follow-up with your primary care provider if any continued problems or concerns.  A prescription for Percocet was sent to the pharmacy to take if needed for pain.  Turn to the emergency department if any severe worsening of your symptoms over the holiday weekend.
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
## 2023-03-12 ENCOUNTER — Emergency Department
Admission: EM | Admit: 2023-03-12 | Discharge: 2023-03-12 | Disposition: A | Discharge status: Home / Self Care | Payer: Medicare HMO | Attending: Emergency Medicine | Admitting: Emergency Medicine

## 2023-03-12 ENCOUNTER — Other Ambulatory Visit: Payer: Self-pay

## 2023-03-12 ENCOUNTER — Encounter: Payer: Self-pay | Admitting: Emergency Medicine

## 2023-03-12 ENCOUNTER — Emergency Department: Payer: Medicare HMO

## 2023-03-12 DIAGNOSIS — B349 Viral infection, unspecified: Secondary | ICD-10-CM | POA: Diagnosis not present

## 2023-03-12 DIAGNOSIS — I509 Heart failure, unspecified: Secondary | ICD-10-CM | POA: Insufficient documentation

## 2023-03-12 DIAGNOSIS — R112 Nausea with vomiting, unspecified: Secondary | ICD-10-CM | POA: Diagnosis not present

## 2023-03-12 DIAGNOSIS — Z86012 Personal history of benign carcinoid tumor: Secondary | ICD-10-CM | POA: Insufficient documentation

## 2023-03-12 DIAGNOSIS — R519 Headache, unspecified: Secondary | ICD-10-CM

## 2023-03-12 DIAGNOSIS — R0789 Other chest pain: Secondary | ICD-10-CM

## 2023-03-12 DIAGNOSIS — R079 Chest pain, unspecified: Secondary | ICD-10-CM | POA: Diagnosis not present

## 2023-03-12 DIAGNOSIS — I2510 Atherosclerotic heart disease of native coronary artery without angina pectoris: Secondary | ICD-10-CM | POA: Diagnosis not present

## 2023-03-12 DIAGNOSIS — J449 Chronic obstructive pulmonary disease, unspecified: Secondary | ICD-10-CM | POA: Insufficient documentation

## 2023-03-12 DIAGNOSIS — I110 Hypertensive heart disease with heart failure: Secondary | ICD-10-CM | POA: Diagnosis not present

## 2023-03-12 DIAGNOSIS — E119 Type 2 diabetes mellitus without complications: Secondary | ICD-10-CM | POA: Diagnosis not present

## 2023-03-12 DIAGNOSIS — Z20822 Contact with and (suspected) exposure to covid-19: Secondary | ICD-10-CM | POA: Diagnosis not present

## 2023-03-12 DIAGNOSIS — E039 Hypothyroidism, unspecified: Secondary | ICD-10-CM | POA: Diagnosis not present

## 2023-03-12 DIAGNOSIS — Z95810 Presence of automatic (implantable) cardiac defibrillator: Secondary | ICD-10-CM | POA: Insufficient documentation

## 2023-03-12 DIAGNOSIS — D72829 Elevated white blood cell count, unspecified: Secondary | ICD-10-CM | POA: Insufficient documentation

## 2023-03-12 DIAGNOSIS — R072 Precordial pain: Secondary | ICD-10-CM | POA: Diagnosis not present

## 2023-03-12 LAB — CBC
HCT: 40.9 % (ref 36.0–46.0)
Hemoglobin: 13.4 g/dL (ref 12.0–15.0)
MCH: 28.5 pg (ref 26.0–34.0)
MCHC: 32.8 g/dL (ref 30.0–36.0)
MCV: 87 fL (ref 80.0–100.0)
Platelets: 325 10*3/uL (ref 150–400)
RBC: 4.7 MIL/uL (ref 3.87–5.11)
RDW: 15.5 % (ref 11.5–15.5)
WBC: 14.4 10*3/uL — ABNORMAL HIGH (ref 4.0–10.5)
nRBC: 0 % (ref 0.0–0.2)

## 2023-03-12 LAB — BASIC METABOLIC PANEL
Anion gap: 15 (ref 5–15)
BUN: 17 mg/dL (ref 6–20)
CO2: 28 mmol/L (ref 22–32)
Calcium: 7.9 mg/dL — ABNORMAL LOW (ref 8.9–10.3)
Chloride: 94 mmol/L — ABNORMAL LOW (ref 98–111)
Creatinine, Ser: 1.13 mg/dL — ABNORMAL HIGH (ref 0.44–1.00)
GFR, Estimated: 58 mL/min — ABNORMAL LOW (ref >60)
Glucose, Bld: 140 mg/dL — ABNORMAL HIGH (ref 70–99)
Potassium: 3.3 mmol/L — ABNORMAL LOW (ref 3.5–5.1)
Sodium: 137 mmol/L (ref 135–145)

## 2023-03-12 LAB — SARS CORONAVIRUS 2 BY RT PCR: SARS Coronavirus 2 by RT PCR: NEGATIVE

## 2023-03-12 LAB — TROPONIN I (HIGH SENSITIVITY)
Troponin I (High Sensitivity): 4 ng/L (ref <18)
Troponin I (High Sensitivity): 3 ng/L (ref <18)

## 2023-03-12 MED ORDER — SODIUM CHLORIDE 0.9 % IV BOLUS
500.0000 mL | Freq: Once | INTRAVENOUS | Status: AC
  Administered 2023-03-12: 500 mL via INTRAVENOUS

## 2023-03-12 MED ORDER — METOCLOPRAMIDE HCL 5 MG/ML IJ SOLN
10.0000 mg | Freq: Once | INTRAMUSCULAR | Status: AC
  Administered 2023-03-12: 10 mg via INTRAVENOUS
  Filled 2023-03-12: qty 2

## 2023-03-12 MED ORDER — DIPHENHYDRAMINE HCL 50 MG/ML IJ SOLN
25.0000 mg | Freq: Once | INTRAMUSCULAR | Status: AC
  Administered 2023-03-12: 25 mg via INTRAVENOUS
  Filled 2023-03-12: qty 1

## 2023-03-12 MED ORDER — KETOROLAC TROMETHAMINE 15 MG/ML IJ SOLN
15.0000 mg | Freq: Once | INTRAMUSCULAR | Status: AC
  Administered 2023-03-12: 15 mg via INTRAVENOUS
  Filled 2023-03-12: qty 1
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
## 2023-03-12 NOTE — Discharge Instructions (Signed)
Return to the ER for new, worsening, or persistent severe chest pain, headache, difficulty breathing, vomiting, weakness, or any other new or worsening symptoms that concern you.  Follow-up with your primary care provider.

## 2023-03-13 ENCOUNTER — Encounter: Payer: Self-pay | Admitting: Oncology

## 2023-03-17 ENCOUNTER — Telehealth: Payer: Self-pay

## 2023-03-17 DIAGNOSIS — G893 Neoplasm related pain (acute) (chronic): Secondary | ICD-10-CM | POA: Diagnosis not present

## 2023-03-17 DIAGNOSIS — F419 Anxiety disorder, unspecified: Secondary | ICD-10-CM | POA: Diagnosis not present

## 2023-03-17 NOTE — Telephone Encounter (Signed)
Transition Care Management Follow-up Telephone Call Date of discharge and from where: 03/12/2023 [facility] How have you been since you were released from the hospital? Patient stated she is still sore but feeling better. Any questions or concerns? No  Items Reviewed: Did the pt receive and understand the discharge instructions provided? Yes  Medications obtained and verified? Yes  Other?  Per patient request sen [id number] referral to local pantries and mailed food pantry list. Any new allergies since your discharge? No  Dietary orders reviewed? Yes Do you have support at home? Yes   Follow up appointments reviewed:  PCP Hospital f/u appt confirmed? No  Scheduled to see  on  @ . Specialist Hospital f/u appt confirmed? Yes  Scheduled to see Renford Dills, MD on 03/30/2023 @ [facility]. Are transportation arrangements needed? No  If their condition worsens, is the pt aware to call PCP or go to the Emergency Dept.? Yes Was the patient provided with contact information for the PCP's office or ED? Yes Was to pt encouraged to call back with questions or concerns? Yes  Emmaly Leech [facility]  [facility] Population Health Community Resource Care Guide   [email or web address]  ?? [phone]   Website: [email or web address]  [email or web address]

## 2023-03-18 ENCOUNTER — Other Ambulatory Visit: Payer: Self-pay

## 2023-03-18 ENCOUNTER — Emergency Department: Payer: Medicare HMO

## 2023-03-18 ENCOUNTER — Emergency Department
Admission: EM | Admit: 2023-03-18 | Discharge: 2023-03-18 | Disposition: A | Discharge status: Home / Self Care | Payer: Medicare HMO | Attending: Emergency Medicine | Admitting: Emergency Medicine

## 2023-03-18 DIAGNOSIS — J449 Chronic obstructive pulmonary disease, unspecified: Secondary | ICD-10-CM | POA: Diagnosis not present

## 2023-03-18 DIAGNOSIS — R519 Headache, unspecified: Secondary | ICD-10-CM | POA: Diagnosis not present

## 2023-03-18 DIAGNOSIS — R0789 Other chest pain: Secondary | ICD-10-CM | POA: Diagnosis not present

## 2023-03-18 DIAGNOSIS — I10 Essential (primary) hypertension: Secondary | ICD-10-CM | POA: Diagnosis not present

## 2023-03-18 DIAGNOSIS — R569 Unspecified convulsions: Secondary | ICD-10-CM | POA: Diagnosis not present

## 2023-03-18 DIAGNOSIS — I509 Heart failure, unspecified: Secondary | ICD-10-CM | POA: Diagnosis not present

## 2023-03-18 DIAGNOSIS — G40909 Epilepsy, unspecified, not intractable, without status epilepticus: Secondary | ICD-10-CM | POA: Insufficient documentation

## 2023-03-18 DIAGNOSIS — E119 Type 2 diabetes mellitus without complications: Secondary | ICD-10-CM | POA: Insufficient documentation

## 2023-03-18 DIAGNOSIS — I2510 Atherosclerotic heart disease of native coronary artery without angina pectoris: Secondary | ICD-10-CM | POA: Insufficient documentation

## 2023-03-18 DIAGNOSIS — I959 Hypotension, unspecified: Secondary | ICD-10-CM | POA: Diagnosis not present

## 2023-03-18 LAB — CBC WITH DIFFERENTIAL/PLATELET
Abs Immature Granulocytes: 0.13 10*3/uL — ABNORMAL HIGH (ref 0.00–0.07)
Basophils Absolute: 0 10*3/uL (ref 0.0–0.1)
Basophils Relative: 1 %
Eosinophils Absolute: 0.2 10*3/uL (ref 0.0–0.5)
Eosinophils Relative: 3 %
HCT: 38.2 % (ref 36.0–46.0)
Hemoglobin: 12.5 g/dL (ref 12.0–15.0)
Immature Granulocytes: 2 %
Lymphocytes Relative: 29 %
Lymphs Abs: 2.5 10*3/uL (ref 0.7–4.0)
MCH: 28.3 pg (ref 26.0–34.0)
MCHC: 32.7 g/dL (ref 30.0–36.0)
MCV: 86.4 fL (ref 80.0–100.0)
Monocytes Absolute: 0.4 10*3/uL (ref 0.1–1.0)
Monocytes Relative: 4 %
Neutro Abs: 5.4 10*3/uL (ref 1.7–7.7)
Neutrophils Relative %: 61 %
Platelets: 242 10*3/uL (ref 150–400)
RBC: 4.42 MIL/uL (ref 3.87–5.11)
RDW: 15.2 % (ref 11.5–15.5)
WBC: 8.7 10*3/uL (ref 4.0–10.5)
nRBC: 0 % (ref 0.0–0.2)

## 2023-03-18 LAB — BASIC METABOLIC PANEL
Anion gap: 9 (ref 5–15)
BUN: 21 mg/dL — ABNORMAL HIGH (ref 6–20)
CO2: 30 mmol/L (ref 22–32)
Calcium: 8.8 mg/dL — ABNORMAL LOW (ref 8.9–10.3)
Chloride: 101 mmol/L (ref 98–111)
Creatinine, Ser: 0.91 mg/dL (ref 0.44–1.00)
GFR, Estimated: >60 mL/min (ref >60)
Glucose, Bld: 148 mg/dL — ABNORMAL HIGH (ref 70–99)
Potassium: 3.6 mmol/L (ref 3.5–5.1)
Sodium: 140 mmol/L (ref 135–145)

## 2023-03-18 MED ORDER — PROCHLORPERAZINE EDISYLATE 10 MG/2ML IJ SOLN
10.0000 mg | Freq: Once | INTRAMUSCULAR | Status: AC
  Administered 2023-03-18: 10 mg via INTRAVENOUS
  Filled 2023-03-18: qty 2

## 2023-03-18 MED ORDER — LORAZEPAM 2 MG/ML IJ SOLN
1.0000 mg | Freq: Once | INTRAMUSCULAR | Status: AC
  Administered 2023-03-18: 1 mg via INTRAVENOUS
  Filled 2023-03-18: qty 1

## 2023-03-18 MED ORDER — DIPHENHYDRAMINE HCL 50 MG/ML IJ SOLN
25.0000 mg | Freq: Once | INTRAMUSCULAR | Status: AC
  Administered 2023-03-18: 25 mg via INTRAVENOUS
  Filled 2023-03-18: qty 1

## 2023-03-18 NOTE — ED Notes (Signed)
Pt has slight head and body tremors. Warm blankets given.

## 2023-03-18 NOTE — ED Triage Notes (Signed)
Pt had witnessed seizure by family, compliant with medications. Last seizure approximately 2 mths ago per family. Pt responsive to sternal rubs at this time. EMS vitals: BP 140/90 CBG 136 O2 97% RA HR 114
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
## 2023-03-21 LAB — LACOSAMIDE

## 2023-03-23 ENCOUNTER — Telehealth: Payer: Self-pay

## 2023-03-23 NOTE — Telephone Encounter (Signed)
Transition Care Management Unsuccessful Follow-up Telephone Call  Date of discharge and from where:  [facility] 7/13  Attempts:  1st Attempt  Reason for unsuccessful TCM follow-up call:  No answer/busy   Lenard Forth [facility] Guide, [facility] [phone] [address] [place], [place], [place] [address] Phone: [phone] Email: [email or web address]

## 2023-03-24 ENCOUNTER — Telehealth: Payer: Self-pay

## 2023-03-24 NOTE — Telephone Encounter (Signed)
Transition Care Management Follow-up Telephone Call Date of discharge and from where:  7/13 How have you been since you were released from the hospital? Doing ok Any questions or concerns? No  Items Reviewed: Did the pt receive and understand the discharge instructions provided? Yes  Medications obtained and verified? Yes  Other? No  Any new allergies since your discharge? No  Dietary orders reviewed? No Do you have support at home? No     Follow up appointments reviewed:  PCP Hospital f/u appt confirmed? Yes  Scheduled to see PCP on 7/23 @ . Specialist Hospital f/u appt confirmed? No  Scheduled to see  on  @ . Are transportation arrangements needed? No  If their condition worsens, is the pt aware to call PCP or go to the Emergency Dept.? Yes Was the patient provided with contact information for the PCP's office or ED? Yes Was to pt encouraged to call back with questions or concerns? Yes
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
## 2023-03-30 ENCOUNTER — Ambulatory Visit (INDEPENDENT_AMBULATORY_CARE_PROVIDER_SITE_OTHER): Payer: Medicare HMO | Admitting: Vascular Surgery

## 2023-03-30 DIAGNOSIS — I25119 Atherosclerotic heart disease of native coronary artery with unspecified angina pectoris: Secondary | ICD-10-CM

## 2023-03-30 DIAGNOSIS — I10 Essential (primary) hypertension: Secondary | ICD-10-CM

## 2023-03-30 DIAGNOSIS — J449 Chronic obstructive pulmonary disease, unspecified: Secondary | ICD-10-CM

## 2023-03-30 DIAGNOSIS — Z96659 Presence of unspecified artificial knee joint: Secondary | ICD-10-CM

## 2023-03-30 DIAGNOSIS — Z86718 Personal history of other venous thrombosis and embolism: Secondary | ICD-10-CM

## 2023-04-03 IMAGING — CR DG CHEST 2V
2 series · 2 of 2 positions shown · non-contrast
Comparison: December 15, 2021

CLINICAL DATA: Seizure.

EXAM:
CHEST - 2 VIEW

[chest pa]
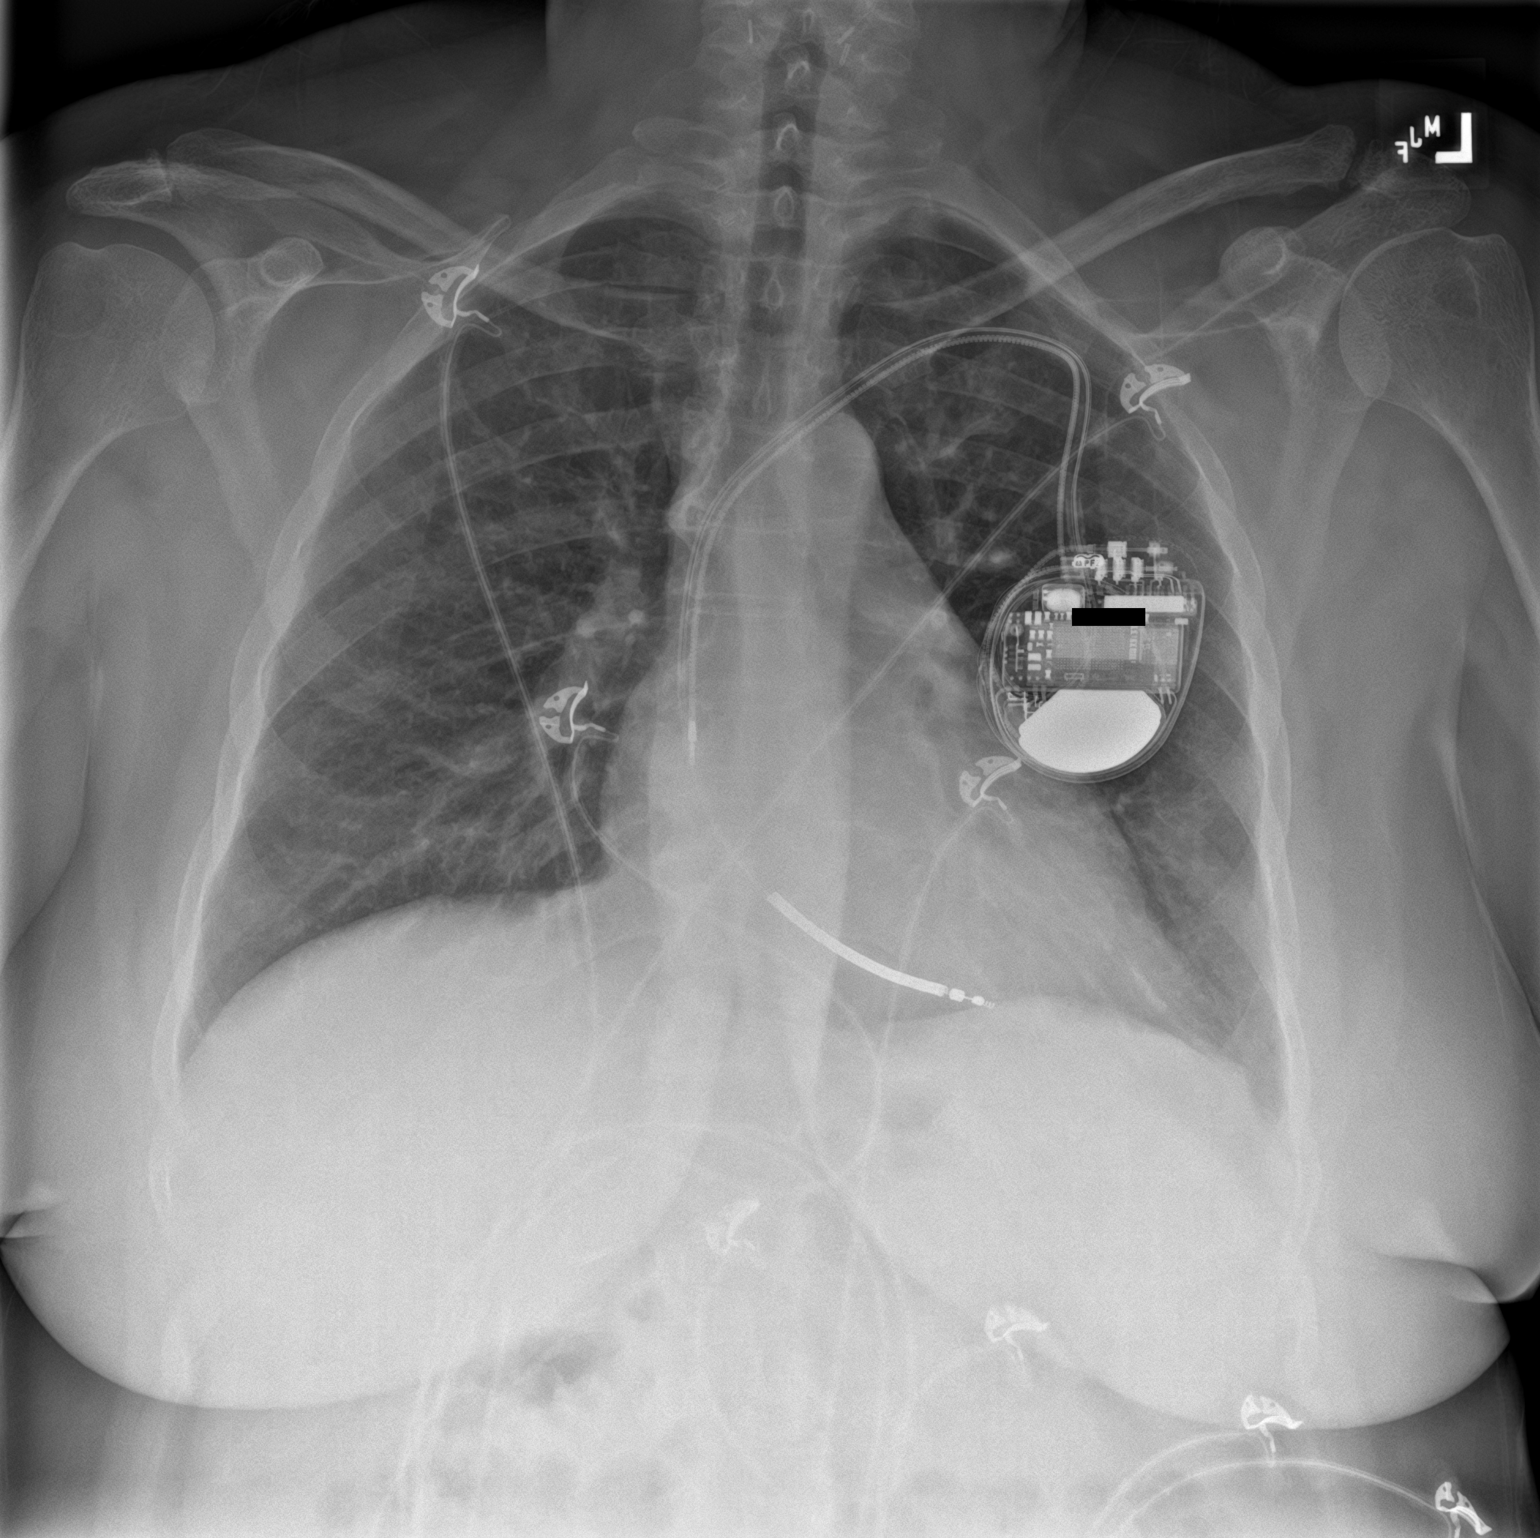

[chest lat]
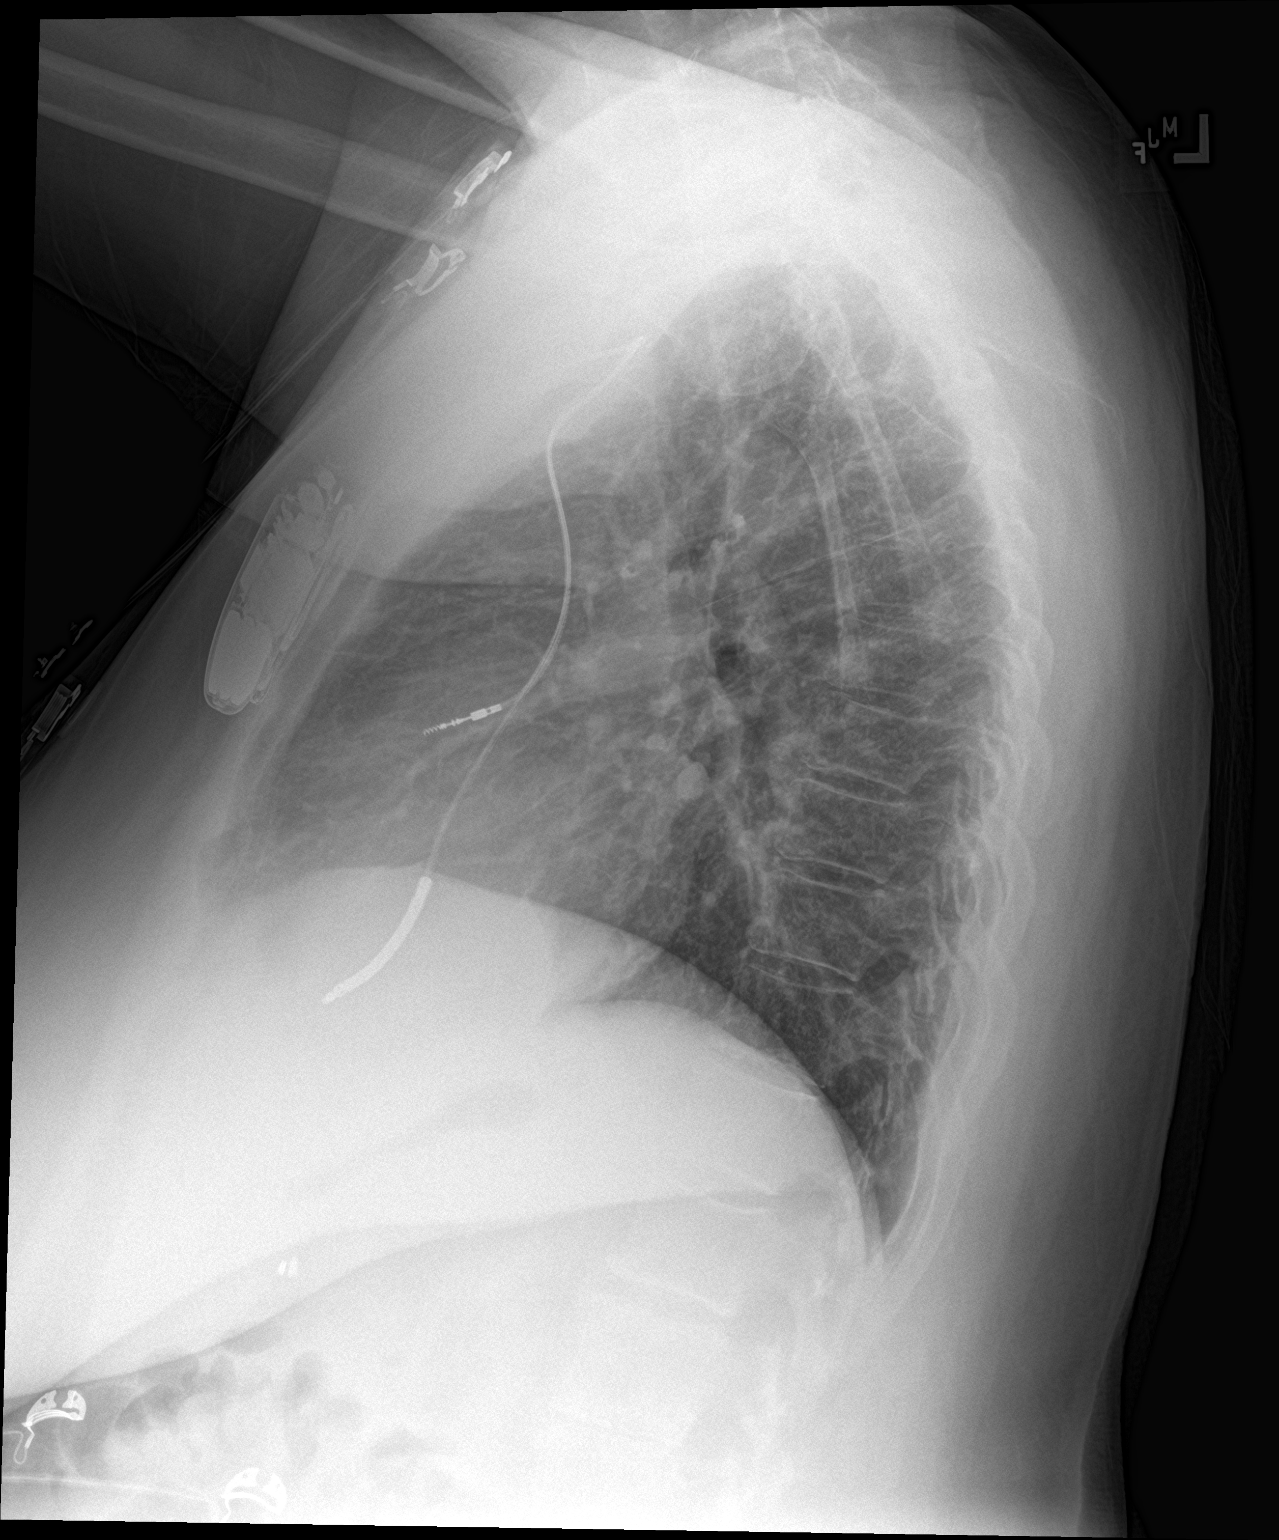

[2 of 2 positions shown; findings below may reference images not displayed]

FINDINGS: There is a dual lead AICD. The heart size and mediastinal contours
are within normal limits. Both lungs are clear. Radiopaque surgical
clips are seen within the right upper quadrant. The visualized
skeletal structures are unremarkable.
IMPRESSION: No active cardiopulmonary disease.

## 2023-04-06 DIAGNOSIS — I5042 Chronic combined systolic (congestive) and diastolic (congestive) heart failure: Secondary | ICD-10-CM | POA: Diagnosis not present

## 2023-04-06 DIAGNOSIS — Z4509 Encounter for adjustment and management of other cardiac device: Secondary | ICD-10-CM | POA: Diagnosis not present

## 2023-04-11 DIAGNOSIS — G43119 Migraine with aura, intractable, without status migrainosus: Secondary | ICD-10-CM | POA: Diagnosis not present

## 2023-04-11 DIAGNOSIS — F418 Other specified anxiety disorders: Secondary | ICD-10-CM | POA: Diagnosis not present

## 2023-04-11 DIAGNOSIS — G4733 Obstructive sleep apnea (adult) (pediatric): Secondary | ICD-10-CM | POA: Diagnosis not present

## 2023-04-11 DIAGNOSIS — G2581 Restless legs syndrome: Secondary | ICD-10-CM | POA: Diagnosis not present

## 2023-04-11 DIAGNOSIS — C7A8 Other malignant neuroendocrine tumors: Secondary | ICD-10-CM | POA: Diagnosis not present

## 2023-04-11 DIAGNOSIS — G40309 Generalized idiopathic epilepsy and epileptic syndromes, not intractable, without status epilepticus: Secondary | ICD-10-CM | POA: Diagnosis not present

## 2023-04-12 ENCOUNTER — Emergency Department
Admission: EM | Admit: 2023-04-12 | Discharge: 2023-04-12 | Disposition: A | Discharge status: Home / Self Care | Payer: Medicare HMO | Attending: Emergency Medicine | Admitting: Emergency Medicine

## 2023-04-12 ENCOUNTER — Other Ambulatory Visit: Payer: Self-pay

## 2023-04-12 ENCOUNTER — Emergency Department: Payer: Medicare HMO

## 2023-04-12 DIAGNOSIS — R079 Chest pain, unspecified: Secondary | ICD-10-CM | POA: Diagnosis present

## 2023-04-12 DIAGNOSIS — U071 COVID-19: Secondary | ICD-10-CM | POA: Diagnosis not present

## 2023-04-12 DIAGNOSIS — J069 Acute upper respiratory infection, unspecified: Secondary | ICD-10-CM | POA: Diagnosis not present

## 2023-04-12 DIAGNOSIS — I2510 Atherosclerotic heart disease of native coronary artery without angina pectoris: Secondary | ICD-10-CM | POA: Insufficient documentation

## 2023-04-12 DIAGNOSIS — R0789 Other chest pain: Secondary | ICD-10-CM | POA: Diagnosis not present

## 2023-04-12 LAB — CBC
HCT: 37.9 % (ref 36.0–46.0)
Hemoglobin: 12.1 g/dL (ref 12.0–15.0)
MCH: 28.4 pg (ref 26.0–34.0)
MCHC: 31.9 g/dL (ref 30.0–36.0)
MCV: 89 fL (ref 80.0–100.0)
Platelets: 244 10*3/uL (ref 150–400)
RBC: 4.26 MIL/uL (ref 3.87–5.11)
RDW: 15.4 % (ref 11.5–15.5)
WBC: 8.4 10*3/uL (ref 4.0–10.5)
nRBC: 0 % (ref 0.0–0.2)

## 2023-04-12 LAB — BASIC METABOLIC PANEL
Anion gap: 10 (ref 5–15)
BUN: 14 mg/dL (ref 6–20)
CO2: 26 mmol/L (ref 22–32)
Calcium: 7.8 mg/dL — ABNORMAL LOW (ref 8.9–10.3)
Chloride: 101 mmol/L (ref 98–111)
Creatinine, Ser: 0.98 mg/dL (ref 0.44–1.00)
GFR, Estimated: >60 mL/min (ref >60)
Glucose, Bld: 132 mg/dL — ABNORMAL HIGH (ref 70–99)
Potassium: 3.2 mmol/L — ABNORMAL LOW (ref 3.5–5.1)
Sodium: 137 mmol/L (ref 135–145)

## 2023-04-12 LAB — TROPONIN I (HIGH SENSITIVITY): Troponin I (High Sensitivity): 3 ng/L (ref <18)

## 2023-04-12 MED ORDER — KETOROLAC TROMETHAMINE 30 MG/ML IJ SOLN
15.0000 mg | Freq: Once | INTRAMUSCULAR | Status: DC
  Filled 2023-04-12: qty 1

## 2023-04-12 MED ORDER — SODIUM CHLORIDE 0.9 % IV SOLN: 12.5000 mg | Freq: Four times a day (QID) | INTRAVENOUS | Status: DC | PRN

## 2023-04-12 MED ORDER — SODIUM CHLORIDE 0.9 % IV BOLUS: 500.0000 mL | Freq: Once | INTRAVENOUS | Status: DC

## 2023-04-12 NOTE — ED Triage Notes (Signed)
Pt to ED via POV from home. Pt reports CP and dizziness that started today. Pt also reports nausea, HA, cough and congestion that started yesterday. Pt denies blood thinners.
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
## 2023-04-13 DIAGNOSIS — I4891 Unspecified atrial fibrillation: Secondary | ICD-10-CM | POA: Diagnosis not present

## 2023-04-13 DIAGNOSIS — I472 Ventricular tachycardia, unspecified: Secondary | ICD-10-CM | POA: Diagnosis not present

## 2023-04-13 DIAGNOSIS — Z95810 Presence of automatic (implantable) cardiac defibrillator: Secondary | ICD-10-CM | POA: Diagnosis not present

## 2023-04-13 DIAGNOSIS — Z4502 Encounter for adjustment and management of automatic implantable cardiac defibrillator: Secondary | ICD-10-CM | POA: Diagnosis not present

## 2023-04-15 DIAGNOSIS — R0602 Shortness of breath: Secondary | ICD-10-CM | POA: Diagnosis not present

## 2023-04-15 DIAGNOSIS — R630 Anorexia: Secondary | ICD-10-CM | POA: Diagnosis not present

## 2023-04-15 DIAGNOSIS — I517 Cardiomegaly: Secondary | ICD-10-CM | POA: Diagnosis not present

## 2023-04-15 DIAGNOSIS — R5383 Other fatigue: Secondary | ICD-10-CM | POA: Diagnosis not present

## 2023-04-15 DIAGNOSIS — R9431 Abnormal electrocardiogram [ECG] [EKG]: Secondary | ICD-10-CM | POA: Diagnosis not present

## 2023-04-15 DIAGNOSIS — I2510 Atherosclerotic heart disease of native coronary artery without angina pectoris: Secondary | ICD-10-CM | POA: Diagnosis not present

## 2023-04-15 DIAGNOSIS — R52 Pain, unspecified: Secondary | ICD-10-CM | POA: Diagnosis not present

## 2023-04-15 DIAGNOSIS — J4489 Other specified chronic obstructive pulmonary disease: Secondary | ICD-10-CM | POA: Diagnosis not present

## 2023-04-15 DIAGNOSIS — Z86718 Personal history of other venous thrombosis and embolism: Secondary | ICD-10-CM | POA: Diagnosis not present

## 2023-04-15 DIAGNOSIS — J811 Chronic pulmonary edema: Secondary | ICD-10-CM | POA: Diagnosis not present

## 2023-04-15 DIAGNOSIS — Z86711 Personal history of pulmonary embolism: Secondary | ICD-10-CM | POA: Diagnosis not present

## 2023-04-15 DIAGNOSIS — R5381 Other malaise: Secondary | ICD-10-CM | POA: Diagnosis not present

## 2023-04-15 DIAGNOSIS — I252 Old myocardial infarction: Secondary | ICD-10-CM | POA: Diagnosis not present

## 2023-04-15 DIAGNOSIS — Z955 Presence of coronary angioplasty implant and graft: Secondary | ICD-10-CM | POA: Diagnosis not present

## 2023-04-15 DIAGNOSIS — R079 Chest pain, unspecified: Secondary | ICD-10-CM | POA: Diagnosis not present

## 2023-04-24 DIAGNOSIS — C7A8 Other malignant neuroendocrine tumors: Secondary | ICD-10-CM | POA: Diagnosis not present

## 2023-04-24 DIAGNOSIS — C7A092 Malignant carcinoid tumor of the stomach: Secondary | ICD-10-CM | POA: Diagnosis not present

## 2023-04-28 ENCOUNTER — Other Ambulatory Visit: Payer: Self-pay

## 2023-04-28 ENCOUNTER — Emergency Department: Admission: EM | Admit: 2023-04-28 | Payer: Medicare HMO | Source: Home / Self Care

## 2023-04-28 DIAGNOSIS — Z5321 Procedure and treatment not carried out due to patient leaving prior to being seen by health care provider: Secondary | ICD-10-CM | POA: Diagnosis not present

## 2023-04-28 DIAGNOSIS — M7918 Myalgia, other site: Secondary | ICD-10-CM | POA: Diagnosis not present

## 2023-04-28 DIAGNOSIS — G2581 Restless legs syndrome: Secondary | ICD-10-CM | POA: Diagnosis not present

## 2023-04-28 DIAGNOSIS — Z1152 Encounter for screening for COVID-19: Secondary | ICD-10-CM | POA: Insufficient documentation

## 2023-04-28 DIAGNOSIS — I493 Ventricular premature depolarization: Secondary | ICD-10-CM | POA: Diagnosis not present

## 2023-04-28 DIAGNOSIS — E876 Hypokalemia: Secondary | ICD-10-CM | POA: Diagnosis not present

## 2023-04-28 DIAGNOSIS — R0789 Other chest pain: Secondary | ICD-10-CM | POA: Diagnosis not present

## 2023-04-28 DIAGNOSIS — M791 Myalgia, unspecified site: Secondary | ICD-10-CM | POA: Diagnosis not present

## 2023-04-28 LAB — COMPREHENSIVE METABOLIC PANEL
ALT: 22 U/L (ref 0–44)
AST: 26 U/L (ref 15–41)
Albumin: 4 g/dL (ref 3.5–5.0)
Alkaline Phosphatase: 97 U/L (ref 38–126)
Anion gap: 10 (ref 5–15)
BUN: 14 mg/dL (ref 6–20)
CO2: 30 mmol/L (ref 22–32)
Calcium: 9.3 mg/dL (ref 8.9–10.3)
Chloride: 99 mmol/L (ref 98–111)
Creatinine, Ser: 0.9 mg/dL (ref 0.44–1.00)
GFR, Estimated: >60 mL/min (ref >60)
Glucose, Bld: 137 mg/dL — ABNORMAL HIGH (ref 70–99)
Potassium: 3.1 mmol/L — ABNORMAL LOW (ref 3.5–5.1)
Sodium: 139 mmol/L (ref 135–145)
Total Bilirubin: 0.4 mg/dL (ref 0.3–1.2)
Total Protein: 7.6 g/dL (ref 6.5–8.1)

## 2023-04-28 LAB — CBC WITH DIFFERENTIAL/PLATELET
Abs Immature Granulocytes: 0.04 10*3/uL (ref 0.00–0.07)
Basophils Absolute: 0.1 10*3/uL (ref 0.0–0.1)
Basophils Relative: 1 %
Eosinophils Absolute: 0.1 10*3/uL (ref 0.0–0.5)
Eosinophils Relative: 1 %
HCT: 37.6 % (ref 36.0–46.0)
Hemoglobin: 12.1 g/dL (ref 12.0–15.0)
Immature Granulocytes: 0 %
Lymphocytes Relative: 30 %
Lymphs Abs: 2.8 10*3/uL (ref 0.7–4.0)
MCH: 28.6 pg (ref 26.0–34.0)
MCHC: 32.2 g/dL (ref 30.0–36.0)
MCV: 88.9 fL (ref 80.0–100.0)
Monocytes Absolute: 0.6 10*3/uL (ref 0.1–1.0)
Monocytes Relative: 6 %
Neutro Abs: 5.6 10*3/uL (ref 1.7–7.7)
Neutrophils Relative %: 62 %
Platelets: 256 10*3/uL (ref 150–400)
RBC: 4.23 MIL/uL (ref 3.87–5.11)
RDW: 15 % (ref 11.5–15.5)
WBC: 9.2 10*3/uL (ref 4.0–10.5)
nRBC: 0 % (ref 0.0–0.2)

## 2023-04-28 NOTE — ED Notes (Signed)
Pt walking around the lobby. Pt to firs nurse desk to ask how long her wait is. She is the last triaged. Advised her of same.

## 2023-04-28 NOTE — ED Triage Notes (Addendum)
Pt states that for last two days she has been restless and c/o muscles aches all over- states "I can't stop jumping all over." Pt has restless leg syndrome. Pt standing up and sitting down and moving arms and legs continuously. Pt AOX4, appears restless. Skin warm and dry, respirations even and unlabored.

## 2023-04-29 ENCOUNTER — Encounter: Payer: Self-pay | Admitting: *Deleted

## 2023-04-29 ENCOUNTER — Other Ambulatory Visit: Payer: Self-pay

## 2023-04-29 ENCOUNTER — Emergency Department
Admission: EM | Admit: 2023-04-29 | Discharge: 2023-04-30 | Disposition: A | Discharge status: Home / Self Care | Payer: Medicare HMO | Source: Home / Self Care | Attending: Emergency Medicine | Admitting: Emergency Medicine

## 2023-04-29 DIAGNOSIS — E876 Hypokalemia: Secondary | ICD-10-CM

## 2023-04-29 DIAGNOSIS — R52 Pain, unspecified: Secondary | ICD-10-CM

## 2023-04-29 DIAGNOSIS — M791 Myalgia, unspecified site: Secondary | ICD-10-CM | POA: Insufficient documentation

## 2023-04-29 DIAGNOSIS — R451 Restlessness and agitation: Secondary | ICD-10-CM | POA: Insufficient documentation

## 2023-04-29 DIAGNOSIS — R0789 Other chest pain: Secondary | ICD-10-CM | POA: Diagnosis not present

## 2023-04-29 DIAGNOSIS — G2581 Restless legs syndrome: Secondary | ICD-10-CM | POA: Diagnosis not present

## 2023-04-29 DIAGNOSIS — Z1152 Encounter for screening for COVID-19: Secondary | ICD-10-CM | POA: Insufficient documentation

## 2023-04-29 LAB — BASIC METABOLIC PANEL
Anion gap: 8 (ref 5–15)
BUN: 13 mg/dL (ref 6–20)
CO2: 27 mmol/L (ref 22–32)
Calcium: 8.7 mg/dL — ABNORMAL LOW (ref 8.9–10.3)
Chloride: 102 mmol/L (ref 98–111)
Creatinine, Ser: 0.83 mg/dL (ref 0.44–1.00)
GFR, Estimated: >60 mL/min (ref >60)
Glucose, Bld: 145 mg/dL — ABNORMAL HIGH (ref 70–99)
Potassium: 3.1 mmol/L — ABNORMAL LOW (ref 3.5–5.1)
Sodium: 137 mmol/L (ref 135–145)

## 2023-04-29 LAB — RESP PANEL BY RT-PCR (RSV, FLU A&B, COVID)  RVPGX2
Influenza A by PCR: NEGATIVE
Influenza B by PCR: NEGATIVE
Resp Syncytial Virus by PCR: NEGATIVE
SARS Coronavirus 2 by RT PCR: NEGATIVE

## 2023-04-29 LAB — CBC WITH DIFFERENTIAL/PLATELET
Abs Immature Granulocytes: 0.03 10*3/uL (ref 0.00–0.07)
Basophils Absolute: 0 10*3/uL (ref 0.0–0.1)
Basophils Relative: 1 %
Eosinophils Absolute: 0.2 10*3/uL (ref 0.0–0.5)
Eosinophils Relative: 2 %
HCT: 36.9 % (ref 36.0–46.0)
Hemoglobin: 12.7 g/dL (ref 12.0–15.0)
Immature Granulocytes: 0 %
Lymphocytes Relative: 33 %
Lymphs Abs: 2.2 10*3/uL (ref 0.7–4.0)
MCH: 30.1 pg (ref 26.0–34.0)
MCHC: 34.4 g/dL (ref 30.0–36.0)
MCV: 87.4 fL (ref 80.0–100.0)
Monocytes Absolute: 0.5 10*3/uL (ref 0.1–1.0)
Monocytes Relative: 7 %
Neutro Abs: 3.8 10*3/uL (ref 1.7–7.7)
Neutrophils Relative %: 57 %
Platelets: 309 10*3/uL (ref 150–400)
RBC: 4.22 MIL/uL (ref 3.87–5.11)
RDW: 15.3 % (ref 11.5–15.5)
WBC: 6.7 10*3/uL (ref 4.0–10.5)
nRBC: 0 % (ref 0.0–0.2)

## 2023-04-29 LAB — MAGNESIUM: Magnesium: 2.4 mg/dL (ref 1.7–2.4)

## 2023-04-29 MED ORDER — ACETAMINOPHEN 325 MG PO TABS
650.0000 mg | ORAL_TABLET | Freq: Once | ORAL | Status: AC
  Administered 2023-04-29: 650 mg via ORAL
  Filled 2023-04-29: qty 2

## 2023-04-29 MED ORDER — DROPERIDOL 2.5 MG/ML IJ SOLN
1.2500 mg | Freq: Once | INTRAMUSCULAR | Status: AC
  Administered 2023-04-29: 1.25 mg via INTRAVENOUS

## 2023-04-29 MED ORDER — POTASSIUM CHLORIDE CRYS ER 20 MEQ PO TBCR
40.0000 meq | EXTENDED_RELEASE_TABLET | Freq: Once | ORAL | Status: AC
  Administered 2023-04-30: 40 meq via ORAL
  Filled 2023-04-29: qty 2

## 2023-04-29 MED ORDER — DIAZEPAM 5 MG/ML IJ SOLN
2.5000 mg | Freq: Once | INTRAMUSCULAR | Status: AC
  Administered 2023-04-29: 2.5 mg via INTRAVENOUS
  Filled 2023-04-29: qty 2

## 2023-04-29 NOTE — ED Provider Notes (Signed)
[facility] Provider Note    Event Date/Time   First MD Initiated Contact with Patient 04/29/23 2155     (approximate)   History   Generalized Body Aches   HPI Brittany Nguyen is a 57 y.o. female presenting today for restlessness.  Patient states over the past 2 days she has had generalized bodyaches and all of her extremities being restless.  She notes no history of anxiety or restless legs.  Her husband states the opposite and that she has had anxiety in the past along with restless leg symptoms been treated for.  She has also been seen in the emergency department for similar things in the past.  Otherwise denying fever, chills, cough, congestion, shortness of breath, chest pain, nausea, vomiting, abdominal pain.     Physical Exam   Triage Vital Signs: ED Triage Vitals  Encounter Vitals Group     BP 04/29/23 2124 (!) 149/93     Systolic BP Percentile --      Diastolic BP Percentile --      Pulse Rate 04/29/23 2124 100     Resp 04/29/23 2124 16     Temp 04/29/23 2155 98.2 F (36.8 C)     Temp Source 04/29/23 2155 Oral     SpO2 04/29/23 2124 98 %     Weight --      Height --      Head Circumference --      Peak Flow --      Pain Score 04/29/23 2121 10     Pain Loc --      Pain Education --      Exclude from Growth Chart --     Most recent vital signs: Vitals:   04/29/23 2155 04/29/23 2200  BP:  109/87  Pulse:  88  Resp:    Temp: 98.2 F (36.8 C)   SpO2:  100%   Physical Exam: I have reviewed the vital signs and nursing notes. General: Awake, alert, no acute distress.  Nontoxic appearing. Head:  Atraumatic, normocephalic.   ENT:  EOM intact, PERRL. Oral mucosa is pink and moist with no lesions. Neck: Neck is supple with full range of motion, No meningeal signs. Cardiovascular:  RRR, No murmurs. Peripheral pulses palpable and equal bilaterally. Respiratory:  Symmetrical chest wall expansion.  No rhonchi, rales, or wheezes.  Good  air movement throughout.  No use of accessory muscles.   Musculoskeletal:  No cyanosis or edema. Moving extremities with full ROM Abdomen:  Soft, nontender, nondistended. Neuro:  GCS 15, moving all four extremities, interacting appropriately. Speech clear. Psych: Restless in bed, wrinkling upper and lower extremities and rocking back and forth in the bed. Skin:  Warm, dry, no rash.     ED Results / Procedures / Treatments   Labs (all labs ordered are listed, but only abnormal results are displayed) Labs Reviewed  RESP PANEL BY RT-PCR (RSV, FLU A&B, COVID)  [id number]  CBC WITH DIFFERENTIAL/PLATELET  BASIC METABOLIC PANEL  MAGNESIUM     EKG    RADIOLOGY    PROCEDURES:  Critical Care performed: No  Procedures   MEDICATIONS ORDERED IN ED: Medications  diazepam (VALIUM) injection 2.5 mg (2.5 mg Intravenous Given 04/29/23 2211)  droperidol (INAPSINE) 2.5 MG/ML injection 1.25 mg (1.25 mg Intravenous Given 04/29/23 2237)     IMPRESSION / MDM / ASSESSMENT AND PLAN / ED COURSE  I reviewed the triage vital signs and the nursing notes.  Differential diagnosis includes, but is not limited to, restless legs, viral URI, anxiety.  Patient's presentation is most consistent with acute illness / injury with system symptoms.  Patient is a 57 year old female presenting today for restlessness.  Viral swabs negative for COVID, flu, RSV.  Patient given Valium with some symptom improvement and then droperidol with further symptom improvement.  Largely suspect patient's symptoms are from anxiety at this time but will get further laboratory workup to rule out electrolyte abnormalities other source of infection.  Patient signed out to oncoming provider awaiting results of labs and reassessment.  The patient is on the cardiac monitor to evaluate for evidence of arrhythmia and/or significant heart rate changes.     FINAL CLINICAL IMPRESSION(S) / ED DIAGNOSES    Final diagnoses:  Restlessness  Body aches     Rx / DC Orders   ED Discharge Orders     None        Note:  This document was prepared using Dragon voice recognition software and may include unintentional dictation errors.   Janith Lima, MD 04/29/23 [phone]

## 2023-04-29 NOTE — ED Notes (Signed)
Pt appears to be extremely anxious and is rocking herself back and forth claiming that she cannot stay still. Her husband states she does suffer from anxiety, although the pt stated that she didn't.  She states that she doesn't have restless legs, but the husband states that she does, and she in fact does take medication for it.  "This is different though, my whole body is feeling restless at this time."

## 2023-04-29 NOTE — Discharge Instructions (Addendum)
Return to the ER for worsening symptoms, persistent vomiting, difficulty breathing or other concerns. °

## 2023-04-29 NOTE — ED Notes (Signed)
Pt ambulatory in lobby without difficulty.  

## 2023-04-29 NOTE — ED Provider Notes (Signed)
-----------------------------------------   11:58 PM on 04/29/2023 -----------------------------------------   Laboratory results demonstrate mild hypokalemia, will replete.  Otherwise rest of lab work respiratory panel unremarkable.  Patient is overall feeling better.  Will discharge home with close follow-up with her PCP.  Strict return precautions given.  Patient and spouse verbalized understanding and agree with plan of care.   Irean Hong, MD 04/30/23 [phone]

## 2023-04-29 NOTE — ED Notes (Signed)
Pt name called 3 x. Pt not in lobby.

## 2023-04-29 NOTE — ED Triage Notes (Signed)
Pt says that she "can't keep any of my limbs still". Reports being seeing here yesterday for the same and her symptoms subsided on there own, now they have returned. Hx of restless leg syndrome and taking requip. Denies fevers

## 2023-04-29 NOTE — ED Notes (Signed)
Pt requesting pain medication. No current orders at this time. Primary RN and MD notified.

## 2023-04-30 DIAGNOSIS — G2581 Restless legs syndrome: Secondary | ICD-10-CM | POA: Diagnosis not present

## 2023-05-08 DIAGNOSIS — I5042 Chronic combined systolic (congestive) and diastolic (congestive) heart failure: Secondary | ICD-10-CM | POA: Diagnosis not present

## 2023-05-08 DIAGNOSIS — Z4509 Encounter for adjustment and management of other cardiac device: Secondary | ICD-10-CM | POA: Diagnosis not present

## 2023-05-15 ENCOUNTER — Telehealth (INDEPENDENT_AMBULATORY_CARE_PROVIDER_SITE_OTHER): Payer: Self-pay

## 2023-05-15 ENCOUNTER — Encounter: Payer: Self-pay | Admitting: Oncology

## 2023-05-15 NOTE — Telephone Encounter (Signed)
Patient notified with medical recommendations and verbalized understanding. Patient informed that someone from the office will reach out to schedule dvt study.

## 2023-05-15 NOTE — Telephone Encounter (Signed)
She didn't have a filter removal but this would not cause those issues.  She can come for a DVT study to evaluate the leg pain but I would strongly advise seeing her PCP to rule out things like pneumonia and other issues.  Her IVC filter wouldn't cause rib pain

## 2023-05-15 NOTE — Telephone Encounter (Signed)
Patient left a message informing that she has been feeling funny for the past month. Also she has been having bad cramping near rib area for  the past 1-2 week and occasionally problems with taking a deep breath. She been having right leg pain for past 1-2 weeks yesterday the pain level was a 10. Patient had ivc filter removal on 03/07/2023. Please Advise

## 2023-05-16 ENCOUNTER — Other Ambulatory Visit (INDEPENDENT_AMBULATORY_CARE_PROVIDER_SITE_OTHER): Payer: Self-pay | Admitting: Nurse Practitioner

## 2023-05-16 DIAGNOSIS — M79604 Pain in right leg: Secondary | ICD-10-CM

## 2023-05-17 ENCOUNTER — Ambulatory Visit (INDEPENDENT_AMBULATORY_CARE_PROVIDER_SITE_OTHER): Payer: Medicare HMO

## 2023-05-17 DIAGNOSIS — M79604 Pain in right leg: Secondary | ICD-10-CM

## 2023-05-17 NOTE — Telephone Encounter (Signed)
Patient had her DVT study on today. But before leaving she expressed that she would be getting scheduled for her IVC filter removal but needing to be under anesthesia. Let patient know I would reach out to our surgery scheduler and she would get back with her if needed      Please call and advise

## 2023-05-21 ENCOUNTER — Encounter (INDEPENDENT_AMBULATORY_CARE_PROVIDER_SITE_OTHER): Payer: Self-pay | Admitting: Vascular Surgery

## 2023-05-22 ENCOUNTER — Other Ambulatory Visit (INDEPENDENT_AMBULATORY_CARE_PROVIDER_SITE_OTHER): Payer: Self-pay | Admitting: Nurse Practitioner

## 2023-05-22 ENCOUNTER — Telehealth (INDEPENDENT_AMBULATORY_CARE_PROVIDER_SITE_OTHER): Payer: Self-pay

## 2023-05-22 DIAGNOSIS — Z86718 Personal history of other venous thrombosis and embolism: Secondary | ICD-10-CM

## 2023-05-22 NOTE — Telephone Encounter (Signed)
I attempted to contact the patient to let her know that Dr. Gilda Crease will be sending in a referral to [facility] for her to have her IVC filter removal. Patient's voicemail box if full so I was unable to leave a message.

## 2023-05-26 ENCOUNTER — Emergency Department
Admission: EM | Admit: 2023-05-26 | Discharge: 2023-05-27 | Disposition: A | Discharge status: Home / Self Care | Payer: Medicare HMO | Attending: Emergency Medicine | Admitting: Emergency Medicine

## 2023-05-26 ENCOUNTER — Emergency Department: Payer: Medicaid Other

## 2023-05-26 ENCOUNTER — Other Ambulatory Visit: Payer: Self-pay

## 2023-05-26 ENCOUNTER — Encounter: Payer: Self-pay | Admitting: Oncology

## 2023-05-26 DIAGNOSIS — E119 Type 2 diabetes mellitus without complications: Secondary | ICD-10-CM | POA: Diagnosis not present

## 2023-05-26 DIAGNOSIS — R569 Unspecified convulsions: Secondary | ICD-10-CM | POA: Diagnosis not present

## 2023-05-26 DIAGNOSIS — R531 Weakness: Secondary | ICD-10-CM | POA: Diagnosis not present

## 2023-05-26 DIAGNOSIS — I10 Essential (primary) hypertension: Secondary | ICD-10-CM | POA: Insufficient documentation

## 2023-05-26 DIAGNOSIS — E876 Hypokalemia: Secondary | ICD-10-CM | POA: Insufficient documentation

## 2023-05-26 LAB — BASIC METABOLIC PANEL
Anion gap: 17 — ABNORMAL HIGH (ref 5–15)
BUN: 11 mg/dL (ref 6–20)
CO2: 30 mmol/L (ref 22–32)
Calcium: 8.4 mg/dL — ABNORMAL LOW (ref 8.9–10.3)
Chloride: 91 mmol/L — ABNORMAL LOW (ref 98–111)
Creatinine, Ser: 0.88 mg/dL (ref 0.44–1.00)
GFR, Estimated: >60 mL/min (ref >60)
Glucose, Bld: 119 mg/dL — ABNORMAL HIGH (ref 70–99)
Potassium: 2.8 mmol/L — ABNORMAL LOW (ref 3.5–5.1)
Sodium: 138 mmol/L (ref 135–145)

## 2023-05-26 LAB — URINALYSIS, ROUTINE W REFLEX MICROSCOPIC
Bilirubin Urine: NEGATIVE
Glucose, UA: NEGATIVE mg/dL
Hgb urine dipstick: NEGATIVE
Ketones, ur: NEGATIVE mg/dL
Leukocytes,Ua: NEGATIVE
Nitrite: NEGATIVE
Protein, ur: NEGATIVE mg/dL
Specific Gravity, Urine: 1.006 (ref 1.005–1.030)
pH: 7 (ref 5.0–8.0)

## 2023-05-26 LAB — CBC
HCT: 38.7 % (ref 36.0–46.0)
Hemoglobin: 12.8 g/dL (ref 12.0–15.0)
MCH: 28.4 pg (ref 26.0–34.0)
MCHC: 33.1 g/dL (ref 30.0–36.0)
MCV: 85.8 fL (ref 80.0–100.0)
Platelets: 275 10*3/uL (ref 150–400)
RBC: 4.51 MIL/uL (ref 3.87–5.11)
RDW: 15.3 % (ref 11.5–15.5)
WBC: 10.7 10*3/uL — ABNORMAL HIGH (ref 4.0–10.5)
nRBC: 0 % (ref 0.0–0.2)

## 2023-05-26 MED ORDER — LACTATED RINGERS IV BOLUS
1000.0000 mL | Freq: Once | INTRAVENOUS | Status: AC
  Administered 2023-05-26: 1000 mL via INTRAVENOUS

## 2023-05-26 MED ORDER — POTASSIUM CHLORIDE 10 MEQ/100ML IV SOLN
10.0000 meq | Freq: Once | INTRAVENOUS | Status: AC
  Administered 2023-05-26: 10 meq via INTRAVENOUS
  Filled 2023-05-26: qty 100

## 2023-05-26 MED ORDER — ACETAMINOPHEN 500 MG PO TABS
1000.0000 mg | ORAL_TABLET | Freq: Once | ORAL | Status: AC
  Administered 2023-05-27: 1000 mg via ORAL
  Filled 2023-05-26: qty 2

## 2023-05-26 MED ORDER — POTASSIUM CHLORIDE CRYS ER 20 MEQ PO TBCR
40.0000 meq | EXTENDED_RELEASE_TABLET | Freq: Once | ORAL | Status: AC
  Administered 2023-05-26: 40 meq via ORAL
  Filled 2023-05-26: qty 2

## 2023-05-26 MED ORDER — KETOROLAC TROMETHAMINE 15 MG/ML IJ SOLN
15.0000 mg | Freq: Once | INTRAMUSCULAR | Status: AC
  Administered 2023-05-26: 15 mg via INTRAVENOUS
  Filled 2023-05-26: qty 1

## 2023-05-26 NOTE — ED Triage Notes (Signed)
Pt ambulated into lobby independently. Reports fatigue and weakness following seizure 2 hours pta. Hx of same and compliant with medication per pt report. Pt alert and oriented and following commands. Breathing unlabored. Placed in wheelchair on arrival. Reports h/a. States seizure was witnessed by spouse.

## 2023-05-26 NOTE — ED Provider Notes (Signed)
[facility] Provider Note    Event Date/Time   First MD Initiated Contact with Patient 05/26/23 2126     (approximate)   History   Weakness   HPI Brittany Nguyen is a 57 y.o. female with seizure history, HTN, DM2 presenting today for seizure-like episode and weakness.  Reportedly is felt weak today.  Husband noticed her have an episode where she became less talkative and started to slump down in her chair which she described as her normal "seizure-like activity".  Very brief episode and patient was back to it afterwards.  She has felt weak and bodyaches since then.  She has been able to walk without difficulty.  Otherwise denying head injury, chest pain, shortness of breath, nausea, vomiting, abdominal pain.     Physical Exam   Triage Vital Signs: ED Triage Vitals  Encounter Vitals Group     BP 05/26/23 2028 108/72     Systolic BP Percentile --      Diastolic BP Percentile --      Pulse Rate 05/26/23 2028 92     Resp 05/26/23 2028 17     Temp 05/26/23 2028 98 F (36.7 C)     Temp Source 05/26/23 2028 Oral     SpO2 05/26/23 2028 100 %     Weight 05/26/23 2028 200 lb (90.7 kg)     Height 05/26/23 2028 5\' 5"  (1.651 m)     Head Circumference --      Peak Flow --      Pain Score 05/26/23 2028 3     Pain Loc --      Pain Education --      Exclude from Growth Chart --     Most recent vital signs: Vitals:   05/26/23 2300 05/26/23 2330  BP: 102/70 101/75  Pulse: 71 65  Resp: 16 16  Temp:    SpO2: 95% 97%   Physical Exam: I have reviewed the vital signs and nursing notes. General: Awake, alert, no acute distress.  Nontoxic appearing. Head:  Atraumatic, normocephalic.   ENT:  EOM intact, PERRL. Oral mucosa is pink and moist with no lesions. Neck: Neck is supple with full range of motion, No meningeal signs. Cardiovascular:  RRR, No murmurs. Peripheral pulses palpable and equal bilaterally. Respiratory:  Symmetrical chest wall expansion.  No  rhonchi, rales, or wheezes.  Good air movement throughout.  No use of accessory muscles.   Musculoskeletal:  No cyanosis or edema. Moving extremities with full ROM Abdomen:  Soft, nontender, nondistended. Neuro:  GCS 15, moving all four extremities, interacting appropriately. Speech clear. Psych:  Calm, appropriate.   Skin:  Warm, dry, no rash.    ED Results / Procedures / Treatments   Labs (all labs ordered are listed, but only abnormal results are displayed) Labs Reviewed  BASIC METABOLIC PANEL - Abnormal; Notable for the following components:      Result Value   Potassium 2.8 (*)    Chloride 91 (*)    Glucose, Bld 119 (*)    Calcium 8.4 (*)    Anion gap 17 (*)    All other components within normal limits  CBC - Abnormal; Notable for the following components:   WBC 10.7 (*)    All other components within normal limits  URINALYSIS, ROUTINE W REFLEX MICROSCOPIC - Abnormal; Notable for the following components:   Color, Urine STRAW (*)    APPearance CLEAR (*)    All other components within normal  limits  CBG MONITORING, ED     EKG My EKG interpretation: Rate of 89, sinus rhythm with first-degree AV block with prolonged PR interval at 236.  Left axis deviation.  No acute ST elevations or depressions.   RADIOLOGY Independently interpreted CT head with no acute pathology   PROCEDURES:  Critical Care performed: No  Procedures   MEDICATIONS ORDERED IN ED: Medications  acetaminophen (TYLENOL) tablet 1,000 mg (has no administration in time range)  lactated ringers bolus 1,000 mL (0 mLs Intravenous Stopped 05/26/23 2345)  potassium chloride 10 mEq in 100 mL IVPB (0 mEq Intravenous Stopped 05/26/23 2318)  potassium chloride SA (KLOR-CON M) CR tablet 40 mEq (40 mEq Oral Given 05/26/23 2203)  ketorolac (TORADOL) 15 MG/ML injection 15 mg (15 mg Intravenous Given 05/26/23 2235)     IMPRESSION / MDM / ASSESSMENT AND PLAN / ED COURSE  I reviewed the triage vital signs and the  nursing notes.                              Differential diagnosis includes, but is not limited to, seizure, syncope, dehydration, electrolyte abnormality.  Patient's presentation is most consistent with acute complicated illness / injury requiring diagnostic workup.  Patient is a 57 year old female with seizure history presenting today for potential seizure like activity at home.  Vital signs stable on arrival and no confusion.  CT imaging of head unremarkable.  Laboratory workup most notable for hypokalemia 2.8 with prior history of similar.  Patient has been able to ambulate without issue and denying any acute complaints outside of a mild headache at this time.  Patient was given IV and oral potassium repletion along with 1 L fluids and Toradol.  Reassessed with noted symptomatic improvement.  Patient stable for discharge and follow-up with her primary care provider outpatient.  She was given strict return precautions.  The patient is on the cardiac monitor to evaluate for evidence of arrhythmia and/or significant heart rate changes. Clinical Course as of 05/26/23 2356  Fri May 26, 2023  2231 CT HEAD WO CONTRAST No acute intracranial pathology [DW]  2352 Reassessed patient.  Feeling significantly better at this time.  Back to baseline.  Will plan for discharge once urine result is back [DW]  2355 Urinalysis, Routine w reflex microscopic -Urine, Clean Catch(!) No UTI [DW]    Clinical Course User Index [DW] Janith Lima, MD     FINAL CLINICAL IMPRESSION(S) / ED DIAGNOSES   Final diagnoses:  Seizure-like activity (HCC)  Hypokalemia     Rx / DC Orders   ED Discharge Orders     None        Note:  This document was prepared using Dragon voice recognition software and may include unintentional dictation errors.   Janith Lima, MD 05/26/23 [phone]

## 2023-05-26 NOTE — Discharge Instructions (Signed)
You were seen in the emergency department for your seizure-like activity.  Laboratory workup overall was reassuring as well as CT of your head.  You do have low potassium with evidence of dehydration but otherwise no acute injuries.  Please follow-up with your regular doctor for reassessment next week.

## 2023-06-06 ENCOUNTER — Other Ambulatory Visit: Payer: Self-pay

## 2023-06-06 ENCOUNTER — Emergency Department: Payer: Medicare HMO

## 2023-06-06 ENCOUNTER — Encounter: Payer: Self-pay | Admitting: *Deleted

## 2023-06-06 ENCOUNTER — Emergency Department
Admission: EM | Admit: 2023-06-06 | Discharge: 2023-06-07 | Disposition: A | Discharge status: Home / Self Care | Payer: Medicare HMO | Attending: Emergency Medicine | Admitting: Emergency Medicine

## 2023-06-06 DIAGNOSIS — M79661 Pain in right lower leg: Secondary | ICD-10-CM | POA: Diagnosis not present

## 2023-06-06 DIAGNOSIS — R000 Tachycardia, unspecified: Secondary | ICD-10-CM | POA: Diagnosis not present

## 2023-06-06 DIAGNOSIS — R531 Weakness: Secondary | ICD-10-CM | POA: Diagnosis not present

## 2023-06-06 DIAGNOSIS — M79662 Pain in left lower leg: Secondary | ICD-10-CM | POA: Insufficient documentation

## 2023-06-06 DIAGNOSIS — E876 Hypokalemia: Secondary | ICD-10-CM | POA: Diagnosis not present

## 2023-06-06 DIAGNOSIS — Z95810 Presence of automatic (implantable) cardiac defibrillator: Secondary | ICD-10-CM | POA: Diagnosis not present

## 2023-06-06 LAB — BASIC METABOLIC PANEL
Anion gap: 12 (ref 5–15)
BUN: 16 mg/dL (ref 6–20)
CO2: 30 mmol/L (ref 22–32)
Calcium: 8.3 mg/dL — ABNORMAL LOW (ref 8.9–10.3)
Chloride: 94 mmol/L — ABNORMAL LOW (ref 98–111)
Creatinine, Ser: 1.08 mg/dL — ABNORMAL HIGH (ref 0.44–1.00)
GFR, Estimated: >60 mL/min (ref >60)
Glucose, Bld: 158 mg/dL — ABNORMAL HIGH (ref 70–99)
Potassium: 3 mmol/L — ABNORMAL LOW (ref 3.5–5.1)
Sodium: 136 mmol/L (ref 135–145)

## 2023-06-06 LAB — URINALYSIS, ROUTINE W REFLEX MICROSCOPIC
Bacteria, UA: NONE SEEN
Bilirubin Urine: NEGATIVE
Glucose, UA: NEGATIVE mg/dL
Hgb urine dipstick: NEGATIVE
Ketones, ur: NEGATIVE mg/dL
Nitrite: NEGATIVE
Protein, ur: NEGATIVE mg/dL
Specific Gravity, Urine: 1.01 (ref 1.005–1.030)
pH: 5 (ref 5.0–8.0)

## 2023-06-06 LAB — CBC
HCT: 36.7 % (ref 36.0–46.0)
Hemoglobin: 12.6 g/dL (ref 12.0–15.0)
MCH: 28.9 pg (ref 26.0–34.0)
MCHC: 34.3 g/dL (ref 30.0–36.0)
MCV: 84.2 fL (ref 80.0–100.0)
Platelets: 283 10*3/uL (ref 150–400)
RBC: 4.36 MIL/uL (ref 3.87–5.11)
RDW: 15.3 % (ref 11.5–15.5)
WBC: 11.2 10*3/uL — ABNORMAL HIGH (ref 4.0–10.5)
nRBC: 0 % (ref 0.0–0.2)

## 2023-06-06 LAB — TROPONIN I (HIGH SENSITIVITY)
Troponin I (High Sensitivity): <2 ng/L (ref <18)
Troponin I (High Sensitivity): <2 ng/L (ref <18)

## 2023-06-06 MED ORDER — POTASSIUM CHLORIDE CRYS ER 20 MEQ PO TBCR
40.0000 meq | EXTENDED_RELEASE_TABLET | Freq: Once | ORAL | Status: AC
  Administered 2023-06-06: 40 meq via ORAL
  Filled 2023-06-06: qty 2

## 2023-06-06 MED ORDER — ACETAMINOPHEN 500 MG PO TABS
1000.0000 mg | ORAL_TABLET | Freq: Once | ORAL | Status: AC
  Administered 2023-06-06: 1000 mg via ORAL
  Filled 2023-06-06: qty 2

## 2023-06-06 MED ORDER — IBUPROFEN 600 MG PO TABS
600.0000 mg | ORAL_TABLET | Freq: Once | ORAL | Status: AC
  Administered 2023-06-06: 600 mg via ORAL
  Filled 2023-06-06: qty 1

## 2023-06-06 NOTE — ED Provider Notes (Signed)
[facility] Provider Note    Event Date/Time   First MD Initiated Contact with Patient 06/06/23 2302     (approximate)   History   Weakness   HPI  Brittany Nguyen is a 57 y.o. 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  From the thigh all the way down to the ankles bilaterally without any trauma or inciting events.  Worse in the calves and popliteal area.  Chronic unchanged abdominal pain, with no reports of active GI bleeding currently.   External Medical Documents Reviewed: DVT study performed on 05/17/2023 that the patient has brought with her, negative.      Physical Exam   Triage Vital Signs: ED Triage Vitals  Encounter Vitals Group     BP 06/06/23 2006 108/72     Systolic BP Percentile --      Diastolic BP Percentile --      Pulse Rate 06/06/23 2006 86     Resp 06/06/23 2006 (!) 22     Temp 06/06/23 2006 98.1 F (36.7 C)     Temp Source 06/06/23 2006 Oral     SpO2 06/06/23 2006 97 %     Weight 06/06/23 2002 200 lb (90.7 kg)     Height 06/06/23 2002 5\' 5"  (1.651 m)     Head Circumference --      Peak Flow --      Pain Score 06/06/23 2002 9     Pain Loc --      Pain Education --      Exclude from Growth Chart --     Most recent vital signs: Vitals:   06/06/23 2006  BP: 108/72  Pulse: 86  Resp: (!) 22  Temp: 98.1 F (36.7 C)  SpO2: 97%    General: Awake, no distress.  CV:  Good peripheral perfusion.  Resp:  Normal effort.  Abd:  No distention.  Other:  Awake alert comfortable appearing no acute distress.  No unilateral leg swelling or infectious changes to bilateral lower extremities, with good brisk cap refill and distal pulses intact.  Soft nontender abdomen without rigidity or guarding.   ED Results / Procedures / Treatments   Labs (all labs ordered are listed, but  only abnormal results are displayed) Labs Reviewed  BASIC METABOLIC PANEL - Abnormal; Notable for the following components:      Result Value   Potassium 3.0 (*)    Chloride 94 (*)    Glucose, Bld 158 (*)    Creatinine, Ser 1.08 (*)    Calcium 8.3 (*)    All other components within normal limits  CBC - Abnormal; Notable for the following components:   WBC 11.2 (*)    All other components within normal limits  URINALYSIS, ROUTINE W REFLEX MICROSCOPIC - Abnormal; Notable for the following components:   Color, Urine YELLOW (*)    APPearance CLEAR (*)    Leukocytes,Ua SMALL (*)    All other components within normal limits  MAGNESIUM  TROPONIN I (HIGH SENSITIVITY)  TROPONIN I (HIGH SENSITIVITY)     I ordered and reviewed the above labs they are notable for white blood cell count mildly elevated 11.2  EKG  ED ECG REPORT I, Pilar Jarvis, the attending physician, personally viewed and interpreted this ECG.   Date: 06/06/2023  EKG Time: 2007  Rate: 103  Rhythm:sinus tachycardia  Axis: nl  Intervals: none  ST&T Change: no stemi    RADIOLOGY I independently reviewed and interpreted chest x-ray and see no obvious focalities or pneumothorax I also reviewed radiologist's formal read.   PROCEDURES:  Critical Care performed: No  Procedures   MEDICATIONS ORDERED IN ED: Medications  acetaminophen (TYLENOL) tablet 1,000 mg (has no administration in time range)  ibuprofen (ADVIL) tablet 600 mg (has no administration in time range)  potassium chloride SA (KLOR-CON M) CR tablet 40 mEq (40 mEq Oral Given 06/06/23 2333)   IMPRESSION / MDM / ASSESSMENT AND PLAN / ED COURSE  I reviewed the triage vital signs and the nursing notes.                                Patient's presentation is most consistent with acute presentation with potential threat to life or bodily function.  Differential diagnosis includes, but is not limited to, DVT, muscle cramping/spasms, electrolyte  derangements, hypokalemia   The patient is on the cardiac monitor to evaluate for evidence of arrhythmia and/or significant heart rate changes.  MDM:    History of DVTs with bilateral calf pain suspicious for DVT will check with an ultrasound.  Fortunately no chest pain shortness of breath is just PE also with IVC filter in place that is less likely.  Chronic unchanged abdominal pain will defer further workup as outpatient given no acute changes today doubt intra-abdominal emergencies.  Replete potassium as it is chronically low may be contributing to her muscle pain.         FINAL CLINICAL IMPRESSION(S) / ED DIAGNOSES   Final diagnoses:  Bilateral calf pain     Rx / DC Orders   ED Discharge Orders     None        Note:  This document was prepared using Dragon voice recognition software and may include unintentional dictation errors.    Pilar Jarvis, MD 06/06/23 [phone]

## 2023-06-06 NOTE — ED Triage Notes (Signed)
Pt to triage via wheelchair.  Pt reports feeling weak and wobbly today.  No chest pain or sob.    Pt has bilateral leg pain.  Pt had left knee replacement 9 months ago.   Pt alert

## 2023-06-07 ENCOUNTER — Emergency Department: Payer: Medicare HMO

## 2023-06-07 DIAGNOSIS — M79662 Pain in left lower leg: Secondary | ICD-10-CM | POA: Diagnosis not present

## 2023-06-07 DIAGNOSIS — R531 Weakness: Secondary | ICD-10-CM | POA: Diagnosis not present

## 2023-06-07 LAB — MAGNESIUM: Magnesium: 2 mg/dL (ref 1.7–2.4)

## 2023-06-07 MED ORDER — OXYCODONE HCL 5 MG PO TABS
5.0000 mg | ORAL_TABLET | Freq: Once | ORAL | Status: AC
  Administered 2023-06-07: 5 mg via ORAL
  Filled 2023-06-07: qty 1

## 2023-06-07 NOTE — ED Notes (Signed)
Pt returned from ultrasound

## 2023-06-07 NOTE — ED Notes (Signed)
Pt in ultrasound.

## 2023-06-07 NOTE — Discharge Instructions (Addendum)
Fortunately your testing in the emergency department did not show any emergency conditions like a blood clot in your legs.  Your potassium was slightly low which can cause muscle cramping, see the attached documents on potassium in your diet and have your doctor recheck these levels at your next appointment.  Sometimes with very small calf blood clots, they cannot be detected on ultrasound so if you continue to have pain or discomfort have your doctor recheck an ultrasound in about 1 week.  Thank you for choosing [place] for your health care today!  Please see your primary doctor this week for a follow up appointment.   If you have any new, worsening, or unexpected symptoms call your doctor right away or come back to the emergency department for reevaluation.  It was my pleasure to care for you today.   Daneil Dan Modesto Charon, MD

## 2023-06-08 DIAGNOSIS — I5042 Chronic combined systolic (congestive) and diastolic (congestive) heart failure: Secondary | ICD-10-CM | POA: Diagnosis not present

## 2023-06-28 DIAGNOSIS — G893 Neoplasm related pain (acute) (chronic): Secondary | ICD-10-CM | POA: Diagnosis not present

## 2023-06-30 DIAGNOSIS — K921 Melena: Secondary | ICD-10-CM | POA: Diagnosis not present

## 2023-06-30 DIAGNOSIS — C7A092 Malignant carcinoid tumor of the stomach: Secondary | ICD-10-CM | POA: Diagnosis not present

## 2023-06-30 DIAGNOSIS — R109 Unspecified abdominal pain: Secondary | ICD-10-CM | POA: Diagnosis not present

## 2023-06-30 DIAGNOSIS — K219 Gastro-esophageal reflux disease without esophagitis: Secondary | ICD-10-CM | POA: Diagnosis not present

## 2023-06-30 DIAGNOSIS — J029 Acute pharyngitis, unspecified: Secondary | ICD-10-CM | POA: Diagnosis not present

## 2023-06-30 DIAGNOSIS — E876 Hypokalemia: Secondary | ICD-10-CM | POA: Diagnosis not present

## 2023-06-30 DIAGNOSIS — D509 Iron deficiency anemia, unspecified: Secondary | ICD-10-CM | POA: Diagnosis not present

## 2023-06-30 DIAGNOSIS — R112 Nausea with vomiting, unspecified: Secondary | ICD-10-CM | POA: Diagnosis not present

## 2023-06-30 DIAGNOSIS — C7A8 Other malignant neuroendocrine tumors: Secondary | ICD-10-CM | POA: Diagnosis not present

## 2023-06-30 DIAGNOSIS — K529 Noninfective gastroenteritis and colitis, unspecified: Secondary | ICD-10-CM | POA: Diagnosis not present

## 2023-06-30 DIAGNOSIS — G8929 Other chronic pain: Secondary | ICD-10-CM | POA: Diagnosis not present

## 2023-07-01 ENCOUNTER — Emergency Department
Admission: EM | Admit: 2023-07-01 | Discharge: 2023-07-01 | Disposition: A | Discharge status: Home / Self Care | Payer: Medicare HMO | Attending: Emergency Medicine | Admitting: Emergency Medicine

## 2023-07-01 ENCOUNTER — Other Ambulatory Visit: Payer: Self-pay

## 2023-07-01 DIAGNOSIS — R051 Acute cough: Secondary | ICD-10-CM | POA: Diagnosis not present

## 2023-07-01 DIAGNOSIS — J020 Streptococcal pharyngitis: Secondary | ICD-10-CM | POA: Insufficient documentation

## 2023-07-01 DIAGNOSIS — I509 Heart failure, unspecified: Secondary | ICD-10-CM | POA: Diagnosis not present

## 2023-07-01 DIAGNOSIS — J449 Chronic obstructive pulmonary disease, unspecified: Secondary | ICD-10-CM | POA: Insufficient documentation

## 2023-07-01 DIAGNOSIS — J45909 Unspecified asthma, uncomplicated: Secondary | ICD-10-CM | POA: Diagnosis not present

## 2023-07-01 DIAGNOSIS — R059 Cough, unspecified: Secondary | ICD-10-CM | POA: Diagnosis not present

## 2023-07-01 LAB — GROUP A STREP BY PCR: Group A Strep by PCR: NOT DETECTED

## 2023-07-01 MED ORDER — AZITHROMYCIN 250 MG PO TABS: ORAL_TABLET | ORAL | 0 refills | Status: DC

## 2023-07-01 MED ORDER — NYSTATIN 100000 UNIT/ML MT SUSP: 5.0000 mL | Freq: Four times a day (QID) | OROMUCOSAL | 0 refills | Status: DC | PRN

## 2023-07-01 MED ORDER — PSEUDOEPH-BROMPHEN-DM 30-2-10 MG/5ML PO SYRP
5.0000 mL | ORAL_SOLUTION | Freq: Four times a day (QID) | ORAL | 0 refills | Status: DC | PRN
Start: 2023-07-01 — End: 2023-08-21

## 2023-07-01 NOTE — ED Provider Notes (Signed)
[facility] Emergency Department Provider Note     Event Date/Time   First MD Initiated Contact with Patient 07/01/23 1618     (approximate)   History   Cough   HPI  Gweneth Amparano is a 57 y.o. female with a history of cancer treatment for stomach tumor, OSA, COPD, heart failure, and asthma, presents to the ED for evaluation of ongoing persistent sore throat and cough.  Patient reports she was evaluated at a [facility] facility and was notified at that she left the office that she had a positive strep test.  She was awaiting their order for the Z-Pak, but presents to the ED today for evaluation of her symptoms.  She denies any fevers, chills, sweats but she did also denies any chest pain or shortness of breath.  Physical Exam   Triage Vital Signs: ED Triage Vitals [07/01/23 1510]  Encounter Vitals Group     BP 112/81     Systolic BP Percentile      Diastolic BP Percentile      Pulse Rate (!) 103     Resp 17     Temp 98.2 F (36.8 C)     Temp Source Oral     SpO2 98 %     Weight 200 lb (90.7 kg)     Height 5\' 5"  (1.651 m)     Head Circumference      Peak Flow      Pain Score 8     Pain Loc      Pain Education      Exclude from Growth Chart     Most recent vital signs: Vitals:   07/01/23 1510  BP: 112/81  Pulse: (!) 103  Resp: 17  Temp: 98.2 F (36.8 C)  SpO2: 98%    General Awake, no distress. NAD HEENT NCAT. PERRL. EOMI. No rhinorrhea. Mucous membranes are moist.  CV:  Good peripheral perfusion.  RESP:  Normal effort.  ABD:  No distention.    ED Results / Procedures / Treatments   Labs (all labs ordered are listed, but only abnormal results are displayed) Labs Reviewed  GROUP A STREP BY PCR    EKG  RADIOLOGY  ED Provider Interpretation:  No results found.   PROCEDURES:  Critical Care performed: No  Procedures   MEDICATIONS ORDERED IN ED: Medications - No data to display   IMPRESSION / MDM / ASSESSMENT  AND PLAN / ED COURSE  I reviewed the triage vital signs and the nursing notes.                              Differential diagnosis includes, but is not limited to, COVID, flu, RSV, strep pharyngitis, viral URI, bronchitis, CAP  Patient's presentation is most consistent with acute complicated illness / injury requiring diagnostic workup.  Patient's diagnosis is consistent with sore throat possibly strep pharyngitis.  Patient with reassuring exam and workup at this time.  Rapid strep in the ED is negative today.  Discussed treatment options including treating her strep with azithromycin which would give coverage for any thoracic pathologies.  Patient was agreeable to the plan.  Patient will be discharged home with prescriptions for azithromycin, Magic mouthwash, and Bromfed syrup. Patient is to follow up with her primary provider as discussed, as needed or otherwise directed. Patient is given ED precautions to return to the ED for any worsening or new symptoms.  FINAL CLINICAL IMPRESSION(S) / ED DIAGNOSES   Final diagnoses:  Acute cough  Strep throat     Rx / DC Orders   ED Discharge Orders          Ordered    magic mouthwash (nystatin, lidocaine, diphenhydrAMINE, alum & mag hydroxide) suspension  4 times daily PRN       Note to Pharmacy: PATIENT ALLERGY ALERT - DO NOT MIX W/ LIDOCAINE 1:1:1 Nystatin:diphenhydramine:alum-mag hydroxide (50ml each)   07/01/23 1702    azithromycin (ZITHROMAX Z-PAK) 250 MG tablet        07/01/23 1702    brompheniramine-pseudoephedrine-DM 30-2-10 MG/5ML syrup  4 times daily PRN        07/01/23 1703             Note:  This document was prepared using Dragon voice recognition software and may include unintentional dictation errors.    Lissa Hoard, PA-C 07/03/23 2252    Minna Antis, MD 07/03/23 2253

## 2023-07-01 NOTE — ED Triage Notes (Signed)
Pt sts that she had her cancer tx yesterday and than today pt sts that she has a sore throat with a cough.

## 2023-07-01 NOTE — ED Notes (Signed)
First Nurse Note: Pt to ED via POV. Pt diagnosed with strep throat yesterday and is now reporting trouble breathing and with her airway.

## 2023-07-01 NOTE — Discharge Instructions (Signed)
Take the prescription meds as directed. Follow-up with your primary provider as needed.

## 2023-07-10 DIAGNOSIS — I5042 Chronic combined systolic (congestive) and diastolic (congestive) heart failure: Secondary | ICD-10-CM | POA: Diagnosis not present

## 2023-07-10 DIAGNOSIS — Z4502 Encounter for adjustment and management of automatic implantable cardiac defibrillator: Secondary | ICD-10-CM | POA: Diagnosis not present

## 2023-07-13 DIAGNOSIS — M1711 Unilateral primary osteoarthritis, right knee: Secondary | ICD-10-CM | POA: Diagnosis not present

## 2023-07-13 DIAGNOSIS — Z96652 Presence of left artificial knee joint: Secondary | ICD-10-CM | POA: Diagnosis not present

## 2023-07-13 DIAGNOSIS — Z95810 Presence of automatic (implantable) cardiac defibrillator: Secondary | ICD-10-CM | POA: Diagnosis not present

## 2023-07-13 DIAGNOSIS — Z4502 Encounter for adjustment and management of automatic implantable cardiac defibrillator: Secondary | ICD-10-CM | POA: Diagnosis not present

## 2023-07-19 DIAGNOSIS — H5213 Myopia, bilateral: Secondary | ICD-10-CM | POA: Diagnosis not present

## 2023-07-28 DIAGNOSIS — C7A092 Malignant carcinoid tumor of the stomach: Secondary | ICD-10-CM | POA: Diagnosis not present

## 2023-07-28 DIAGNOSIS — K921 Melena: Secondary | ICD-10-CM | POA: Diagnosis not present

## 2023-07-28 DIAGNOSIS — C7A8 Other malignant neuroendocrine tumors: Secondary | ICD-10-CM | POA: Diagnosis not present

## 2023-07-28 DIAGNOSIS — K529 Noninfective gastroenteritis and colitis, unspecified: Secondary | ICD-10-CM | POA: Diagnosis not present

## 2023-07-28 DIAGNOSIS — K219 Gastro-esophageal reflux disease without esophagitis: Secondary | ICD-10-CM | POA: Diagnosis not present

## 2023-07-28 DIAGNOSIS — R109 Unspecified abdominal pain: Secondary | ICD-10-CM | POA: Diagnosis not present

## 2023-07-28 DIAGNOSIS — D509 Iron deficiency anemia, unspecified: Secondary | ICD-10-CM | POA: Diagnosis not present

## 2023-07-28 DIAGNOSIS — G8929 Other chronic pain: Secondary | ICD-10-CM | POA: Diagnosis not present

## 2023-07-28 DIAGNOSIS — R112 Nausea with vomiting, unspecified: Secondary | ICD-10-CM | POA: Diagnosis not present

## 2023-08-09 ENCOUNTER — Encounter: Payer: Self-pay | Admitting: Oncology

## 2023-08-09 DIAGNOSIS — F331 Major depressive disorder, recurrent, moderate: Secondary | ICD-10-CM | POA: Diagnosis not present

## 2023-08-09 DIAGNOSIS — G893 Neoplasm related pain (acute) (chronic): Secondary | ICD-10-CM | POA: Diagnosis not present

## 2023-08-10 DIAGNOSIS — I5042 Chronic combined systolic (congestive) and diastolic (congestive) heart failure: Secondary | ICD-10-CM | POA: Diagnosis not present

## 2023-08-10 DIAGNOSIS — Z45018 Encounter for adjustment and management of other part of cardiac pacemaker: Secondary | ICD-10-CM | POA: Diagnosis not present

## 2023-08-20 NOTE — Discharge Instructions (Signed)
Instructions after Total Knee Replacement   James P. Hooten, Jr., M.D.    Dept. of Orthopaedics & Sports Medicine [facility] [address] [place], [place]  [address]  Phone: [phone]   Fax: [phone]       [email or web address]       DIET: Drink plenty of non-alcoholic fluids. Resume your normal diet. Include foods high in fiber.  ACTIVITY:  You may use crutches or a walker with weight-bearing as tolerated, unless instructed otherwise. You may be weaned off of the walker or crutches by your Physical Therapist.  Do NOT place pillows under the knee. Anything placed under the knee could limit your ability to straighten the knee.   Continue doing gentle exercises. Exercising will reduce the pain and swelling, increase motion, and prevent muscle weakness.   Please continue to use the TED compression stockings for 6 weeks. You may remove the stockings at night, but should reapply them in the morning. Do not drive or operate any equipment until instructed.  WOUND CARE:  Continue to use the PolarCare or ice packs periodically to reduce pain and swelling. You may bathe or shower after the staples are removed at the first office visit following surgery.  MEDICATIONS: You may resume your regular medications. Please take the pain medication as prescribed on the medication. Do not take pain medication on an empty stomach. You have been given a prescription for a blood thinner (Lovenox or Coumadin). Please take the medication as instructed. (NOTE: After completing a 2 week course of Lovenox, take one Enteric-coated aspirin once a day. This along with elevation will help reduce the possibility of phlebitis in your operated leg.) Do not drive or drink alcoholic beverages when taking pain medications.  CALL THE OFFICE FOR: Temperature above 101 degrees Excessive bleeding or drainage on the dressing. Excessive swelling, coldness, or paleness of the toes. Persistent nausea and  vomiting.  FOLLOW-UP:  You should have an appointment to return to the office in 10-14 days after surgery. Arrangements have been made for continuation of Physical Therapy (either home therapy or outpatient therapy).     [facility] Department Directory         [email or web address]       [email or web address]          Cardiology  Appointments: [place] - [phone] [place] - [phone]  Endocrinology  Appointments: [place] - [phone] [place] - [phone]  Gastroenterology  Appointments: [place] - [phone] [place] - [phone]        General Surgery   Appointments: [place] - [phone]  Internal Medicine/Family Medicine  Appointments: [place] - [phone] [place] - [phone] [place] - [phone]  Metabolic and Weigh Loss Surgery  Appointments: [place] - [phone]        Neurology  Appointments: [place] - [phone] [place] - [phone]  Neurosurgery  Appointments: [place] - [phone]  Obstetrics & Gynecology  Appointments: [place] - [phone] [place] - [phone]        Pediatrics  Appointments: [place] - [phone] [place] - [phone]  Physiatry  Appointments: [place] -[phone]  Physical Therapy  Appointments: [place] - [phone] [place] - [phone]        Podiatry  Appointments: [place] - [phone] [place] - [phone]  Pulmonology  Appointments: [place] - [phone]  Rheumatology  Appointments: [place] - [phone]         Location: [facility]  [address] , [place]  [address]  [place] Location: [facility] [address] [place], [place]  [address]  [place] Location: [facility] [address] [place], [place]  [address]    

## 2023-08-21 ENCOUNTER — Encounter
Admission: RE | Admit: 2023-08-21 | Discharge: 2023-08-21 | Disposition: A | Discharge status: Home / Self Care | Payer: Medicare HMO | Source: Ambulatory Visit | Attending: Orthopedic Surgery | Admitting: Orthopedic Surgery

## 2023-08-21 ENCOUNTER — Other Ambulatory Visit: Payer: Self-pay

## 2023-08-21 VITALS — BP 92/76 | HR 69 | Resp 14 | Ht 65.0 in | Wt 208.0 lb

## 2023-08-21 DIAGNOSIS — I5022 Chronic systolic (congestive) heart failure: Secondary | ICD-10-CM | POA: Insufficient documentation

## 2023-08-21 DIAGNOSIS — M1711 Unilateral primary osteoarthritis, right knee: Secondary | ICD-10-CM | POA: Diagnosis not present

## 2023-08-21 DIAGNOSIS — E119 Type 2 diabetes mellitus without complications: Secondary | ICD-10-CM | POA: Diagnosis not present

## 2023-08-21 DIAGNOSIS — D509 Iron deficiency anemia, unspecified: Secondary | ICD-10-CM

## 2023-08-21 DIAGNOSIS — Z91040 Latex allergy status: Secondary | ICD-10-CM

## 2023-08-21 DIAGNOSIS — I252 Old myocardial infarction: Secondary | ICD-10-CM | POA: Diagnosis not present

## 2023-08-21 DIAGNOSIS — R9431 Abnormal electrocardiogram [ECG] [EKG]: Secondary | ICD-10-CM | POA: Diagnosis not present

## 2023-08-21 DIAGNOSIS — Z01818 Encounter for other preprocedural examination: Secondary | ICD-10-CM | POA: Diagnosis not present

## 2023-08-21 DIAGNOSIS — D3A8 Other benign neuroendocrine tumors: Secondary | ICD-10-CM | POA: Diagnosis not present

## 2023-08-21 DIAGNOSIS — A4902 Methicillin resistant Staphylococcus aureus infection, unspecified site: Secondary | ICD-10-CM

## 2023-08-21 DIAGNOSIS — Z95810 Presence of automatic (implantable) cardiac defibrillator: Secondary | ICD-10-CM | POA: Diagnosis not present

## 2023-08-21 HISTORY — DX: Sepsis, unspecified organism: A41.9

## 2023-08-21 HISTORY — DX: Acute respiratory failure, unspecified whether with hypoxia or hypercapnia: J96.00

## 2023-08-21 HISTORY — DX: Essential (primary) hypertension: I10

## 2023-08-21 HISTORY — DX: Unspecified convulsions: R56.9

## 2023-08-21 HISTORY — DX: Pneumonia, unspecified organism: J18.9

## 2023-08-21 HISTORY — DX: Personal history of other specified conditions: Z87.898

## 2023-08-21 HISTORY — DX: Malignant neoplasm of unspecified site of right female breast: C50.911

## 2023-08-21 HISTORY — DX: Unspecified asthma, uncomplicated: J45.909

## 2023-08-21 LAB — COMPREHENSIVE METABOLIC PANEL
ALT: 22 U/L (ref 0–44)
AST: 27 U/L (ref 15–41)
Albumin: 4.2 g/dL (ref 3.5–5.0)
Alkaline Phosphatase: 94 U/L (ref 38–126)
Anion gap: 13 (ref 5–15)
BUN: 14 mg/dL (ref 6–20)
CO2: 30 mmol/L (ref 22–32)
Calcium: 8.4 mg/dL — ABNORMAL LOW (ref 8.9–10.3)
Chloride: 97 mmol/L — ABNORMAL LOW (ref 98–111)
Creatinine, Ser: 1.02 mg/dL — ABNORMAL HIGH (ref 0.44–1.00)
GFR, Estimated: >60 mL/min (ref >60)
Glucose, Bld: 128 mg/dL — ABNORMAL HIGH (ref 70–99)
Potassium: 3.2 mmol/L — ABNORMAL LOW (ref 3.5–5.1)
Sodium: 140 mmol/L (ref 135–145)
Total Bilirubin: 0.2 mg/dL (ref <1.2)
Total Protein: 8 g/dL (ref 6.5–8.1)

## 2023-08-21 LAB — URINALYSIS, ROUTINE W REFLEX MICROSCOPIC
Bilirubin Urine: NEGATIVE
Glucose, UA: NEGATIVE mg/dL
Hgb urine dipstick: NEGATIVE
Ketones, ur: NEGATIVE mg/dL
Nitrite: NEGATIVE
Protein, ur: NEGATIVE mg/dL
Specific Gravity, Urine: 1.017 (ref 1.005–1.030)
pH: 5 (ref 5.0–8.0)

## 2023-08-21 LAB — CBC
HCT: 38.2 % (ref 36.0–46.0)
Hemoglobin: 12.5 g/dL (ref 12.0–15.0)
MCH: 28.9 pg (ref 26.0–34.0)
MCHC: 32.7 g/dL (ref 30.0–36.0)
MCV: 88.2 fL (ref 80.0–100.0)
Platelets: 281 10*3/uL (ref 150–400)
RBC: 4.33 MIL/uL (ref 3.87–5.11)
RDW: 14.9 % (ref 11.5–15.5)
WBC: 8.2 10*3/uL (ref 4.0–10.5)
nRBC: 0 % (ref 0.0–0.2)

## 2023-08-21 LAB — SURGICAL PCR SCREEN
MRSA, PCR: NEGATIVE
Staphylococcus aureus: POSITIVE — AB

## 2023-08-21 LAB — C-REACTIVE PROTEIN: CRP: 0.5 mg/dL (ref <1.0)

## 2023-08-21 LAB — HEMOGLOBIN A1C
Hgb A1c MFr Bld: 6.4 % — ABNORMAL HIGH (ref 4.8–5.6)
Mean Plasma Glucose: 136.98 mg/dL

## 2023-08-21 LAB — SEDIMENTATION RATE: Sed Rate: 46 mm/h — ABNORMAL HIGH (ref 0–30)

## 2023-08-21 NOTE — Patient Instructions (Signed)
Your procedure is scheduled on: Monday 08/28/23 To find out your arrival time, please call [phone] between 1PM - 3PM on: Friday 08/25/23   Report to the Registration Desk on the 1st floor of the [facility]. Free Valet parking is available.  If your arrival time is 6:00 am, do not arrive before that time as the [facility] entrance doors do not open until 6:00 am.  REMEMBER: Instructions that are not followed completely may result in serious medical risk, up to and including death; or upon the discretion of your surgeon and anesthesiologist your surgery may need to be rescheduled.  Do not eat food after midnight the night before surgery.  No gum chewing or hard candies.  You may however, drink CLEAR liquids up to 2 hours before you are scheduled to arrive for your surgery. Do not drink anything within 2 hours of your scheduled arrival time.  Clear liquids include: - water  - apple juice without pulp - gatorade (not RED colors) - black coffee or tea (Do NOT add milk or creamers to the coffee or tea) Do NOT drink anything that is not on this list.  Type 1 and Type 2 diabetics should only drink water.  In addition, your doctor has ordered for you to drink the provided:  Ensure Pre-Surgery Clear Carbohydrate Drink  Gatorade G2 Drinking this carbohydrate drink up to two hours before surgery helps to reduce insulin resistance and improve patient outcomes. Please complete drinking 2 hours before scheduled arrival time.  One week prior to surgery: Stop Anti-inflammatories (NSAIDS) such as Advil, Aleve, Ibuprofen, Motrin, Naproxen, Naprosyn and Aspirin based products such as Excedrin, Goody's Powder, BC Powder. You may however, continue to take Tylenol if needed for pain up until the day of surgery.  Stop ANY OVER THE COUNTER supplements until after surgery.  Continue taking all prescribed medications with the exception of the following: Hold Meloxicam for 7 days may substitute  Tylenol  TAKE ONLY THESE MEDICATIONS THE MORNING OF SURGERY WITH A SIP OF WATER:  escitalopram (LEXAPRO) 10 MG tablet  Lacosamide 150 MG TABS  levothyroxine (SYNTHROID) 175 MCG tablet  methadone (DOLOPHINE) 7.5 MG tablet  metoprolol succinate (TOPROL-XL) 50 MG 24 hr tablet  May take oxycodone only if needed  Use inhalers on the day of surgery and bring to the hospital.  No Alcohol for 24 hours before or after surgery.  No Smoking including e-cigarettes for 24 hours before surgery.  No chewable tobacco products for at least 6 hours before surgery.  No nicotine patches on the day of surgery.  Do not use any "recreational" drugs for at least a week (preferably 2 weeks) before your surgery.  Please be advised that the combination of cocaine and anesthesia may have negative outcomes, up to and including death. If you test positive for cocaine, your surgery will be cancelled.  On the morning of surgery brush your teeth with toothpaste and water, you may rinse your mouth with mouthwash if you wish. Do not swallow any toothpaste or mouthwash.  Use CHG Soap or wipes as directed on instruction sheet. See instructions below  Do not wear lotions, powders, or perfumes on the day of surgery  Do not shave body hair from the neck down 48 hours before surgery.  Wear comfortable clothing (specific to your surgery type) to the hospital.  Do not wear jewelry, make-up, hairpins, clips or nail polish.  For welded (permanent) jewelry: bracelets, anklets, waist bands, etc.  Please have this removed  prior to surgery.  If it is not removed, there is a chance that hospital personnel will need to cut it off on the day of surgery. Contact lenses, hearing aids and dentures may not be worn into surgery.  Bring your C-PAP to the hospital in case you may have to spend the night.   Do not bring valuables to the hospital. [facility] is not responsible for any missing/lost belongings or valuables.   Notify  your doctor if there is any change in your medical condition (cold, fever, infection).  If you are being discharged the day of surgery, you will not be allowed to drive home. You will need a responsible individual to drive you home and stay with you for 24 hours after surgery.   If you are taking public transportation, you will need to have a responsible individual with you.  If you are being admitted to the hospital overnight, leave your suitcase in the car. After surgery it may be brought to your room.  In case of increased patient census, it may be necessary for you, the patient, to continue your postoperative care in the Same Day Surgery department.  After surgery, you can help prevent lung complications by doing breathing exercises.  Take deep breaths and cough every 1-2 hours. Your doctor may order a device called an Incentive Spirometer to help you take deep breaths. When coughing or sneezing, hold a pillow firmly against your incision with both hands. This is called "splinting." Doing this helps protect your incision. It also decreases belly discomfort.  Surgery Visitation Policy:  Patients undergoing a surgery or procedure may have two family members or support persons with them as long as the person is not COVID-19 positive or experiencing its symptoms.   Inpatient Visitation:    Visiting hours are 7 a.m. to 8 p.m. Up to four visitors are allowed at one time in a patient room. The visitors may rotate out with other people during the day. One designated support person (adult) may remain overnight.  Please call the Pre-admissions Testing Dept. at [phone] if you have any questions about these instructions.    Pre-operative 5 CHG Bath Instructions   You can play a key role in reducing the risk of infection after surgery. Your skin needs to be as free of germs as possible. You can reduce the number of germs on your skin by washing with CHG (chlorhexidine gluconate) soap  before surgery. CHG is an antiseptic soap that kills germs and continues to kill germs even after washing.   DO NOT use if you have an allergy to chlorhexidine/CHG or antibacterial soaps. If your skin becomes reddened or irritated, stop using the CHG and notify one of our RNs at [phone].   Please shower with the CHG soap starting 4 days before surgery using the following schedule:   Thursday 08/24/23 - Monday 08/28/23    Please keep in mind the following:  DO NOT shave, including legs and underarms, starting the day of your first shower.   You may shave your face at any point before/day of surgery.  Place clean sheets on your bed the day you start using CHG soap. Use a clean washcloth (not used since being washed) for each shower. DO NOT sleep with pets once you start using the CHG.   CHG Shower Instructions:  If you choose to wash your hair and private area, wash first with your normal shampoo/soap.  After you use shampoo/soap, rinse your hair and body  thoroughly to remove shampoo/soap residue.  Turn the water OFF and apply about 3 tablespoons (45 ml) of CHG soap to a CLEAN washcloth.  Apply CHG soap ONLY FROM YOUR NECK DOWN TO YOUR TOES (washing for 3-5 minutes)  DO NOT use CHG soap on face, private areas, open wounds, or sores.  Pay special attention to the area where your surgery is being performed.  If you are having back surgery, having someone wash your back for you may be helpful. Wait 2 minutes after CHG soap is applied, then you may rinse off the CHG soap.  Pat dry with a clean towel  Put on clean clothes/pajamas   If you choose to wear lotion, please use ONLY the CHG-compatible lotions on the back of this paper.     Additional instructions for the day of surgery: DO NOT APPLY any lotions, deodorants, cologne, or perfumes.   Put on clean/comfortable clothes.  Brush your teeth.  Ask your nurse before applying any prescription medications to the skin.      CHG  Compatible Lotions   Aveeno Moisturizing lotion  Cetaphil Moisturizing Cream  Cetaphil Moisturizing Lotion  Clairol Herbal Essence Moisturizing Lotion, Dry Skin  Clairol Herbal Essence Moisturizing Lotion, Extra Dry Skin  Clairol Herbal Essence Moisturizing Lotion, Normal Skin  Curel Age Defying Therapeutic Moisturizing Lotion with Alpha Hydroxy  Curel Extreme Care Body Lotion  Curel Soothing Hands Moisturizing Hand Lotion  Curel Therapeutic Moisturizing Cream, Fragrance-Free  Curel Therapeutic Moisturizing Lotion, Fragrance-Free  Curel Therapeutic Moisturizing Lotion, Original Formula  Eucerin Daily Replenishing Lotion  Eucerin Dry Skin Therapy Plus Alpha Hydroxy Crme  Eucerin Dry Skin Therapy Plus Alpha Hydroxy Lotion  Eucerin Original Crme  Eucerin Original Lotion  Eucerin Plus Crme Eucerin Plus Lotion  Eucerin TriLipid Replenishing Lotion  Keri Anti-Bacterial Hand Lotion  Keri Deep Conditioning Original Lotion Dry Skin Formula Softly Scented  Keri Deep Conditioning Original Lotion, Fragrance Free Sensitive Skin Formula  Keri Lotion Fast Absorbing Fragrance Free Sensitive Skin Formula  Keri Lotion Fast Absorbing Softly Scented Dry Skin Formula  Keri Original Lotion  Keri Skin Renewal Lotion Keri Silky Smooth Lotion  Keri Silky Smooth Sensitive Skin Lotion  Nivea Body Creamy Conditioning Oil  Nivea Body Extra Enriched Teacher, adult education Moisturizing Lotion Nivea Crme  Nivea Skin Firming Lotion  NutraDerm 30 Skin Lotion  NutraDerm Skin Lotion  NutraDerm Therapeutic Skin Cream  NutraDerm Therapeutic Skin Lotion  ProShield Protective Hand Cream  Provon moisturizing lotion

## 2023-08-21 NOTE — Progress Notes (Addendum)
Perioperative Services Pre-Admission/Anesthesia Testing    Date: 08/21/23  Name: Brittany Nguyen MRN:   [id number]  Re: Abnormal ECG  Planned Surgical Procedure(s):    Case: [id number] Date/Time: 08/28/23 1110   Procedure: COMPUTER ASSISTED TOTAL KNEE ARTHROPLASTY (Right: Knee)   Anesthesia type: Choice   Pre-op diagnosis: PRIMARY OSTEOARTHRITIS OF RIGHT KNEE.   Location: [facility] OR ROOM 01 / [facility] ORS FOR ANESTHESIA GROUP   Surgeons: Donato Heinz, MD      Clinical Notes:  Patient is scheduled for the above procedure on 08/28/2023 with Dr. Francesco Sor, MD.  And perforation.  Surgery, patient presented to the PAT clinic on the morning of 08/21/2023 for preoperative labs and ECG.  In review of her preoperative ECG, patient with diffusely noted ST and T wave abnormalities worse and the inferolateral leads.  ECG was compared to last tracing performed here at [facility] on 06/06/2023, with noted changes appearing to be new since that time.  In brief review of her available medical records, patient with a significant cardiovascular history, including remote history of MI, NICM with resulting HFrEF, and AICD placement.  She is followed by cardiology at [facility].  ECG:    Impression and Plan:  Brittany Nguyen with presumably acute diffusely noted ECG changes when compared to previous tracing obtained earlier this year. Tracing was faxed to her cardiologist office for review.  Per cardiology office, patient is overdue for follow-up in the cardiology clinic.  Given her significant cardiovascular history, patient will require clearance prior to proceeding with general anesthesia for elective orthopedic procedure.  Additionally, patient will also require clearance of her implanted cardiac device prior to surgery.  Per cardiology office, until patient is seen in follow-up, clearance cannot be provided.  Attempted to contact patient to advise her of recommendations from  cardiology regarding need for follow-up.  Patient has a voicemail that has not been set up.  I did send her message in MyChart earlier today, however as of 1400 on 08/21/2023, patient has not viewed the message.  Will continue to attempt to contact patient.    No changes are being made to the OR schedule, however given ECG changes and presence of implanted cardiac device, patient will require the cardiology service line to sign off prior to proceeding.  Surgery may have to be rescheduled.  Patient was advised to contact her cardiologist prior to leaving the clinic today, however I did receive a message from the cardiology clinic advising that they were unable to get in touch with the patient to schedule an appointment.  They asked that we have patient contact them as soon as possible to get her scheduled for preoperative evaluation and clearance. Note being sent to primary attending surgeon's consent s office to make him aware of the presumably acute ECG changes and need for further evaluation prior to elective surgery.  ADDENDUM 08/22/2023 at 0730 AM: Received communication from patient via MyChart regarding follow up with cardiology. Patient will be seen today (08/22/2023) at 0945 AM by her cardiologist for follow up evaluation, repeat ECG, and subsequent clearance as deemed appropriate by the cardiology service line. Will plan on following up on documentation from cardiology regarding patient's upcoming surgery. Will keep surgeon aware of any recommendations and/or needs for changes to Brittany Nguyen's surgical plan of care.   ADDENDUM 08/22/2023 at 1457 PM: Followed up on patient's visit with cardiology earlier today. Patient with persistent ECG changes noted on repeat ECG performed by her cardiologist.  Plans are for patient to undergo a cardiac PET CT for further evaluation. Patient has communicated this information to Dr. Elenor Legato office already. Per the available documentation, surgery has been  cancelled at this point. She is scheduled to have the cardia PET CT scan on 08/29/2023 at 0830 AM. Following review of results, further recommendations will be made based on findings, those of which may include repeat cardiac catheterization. Patient to keep surgeon's office up to date on recommendations from her cardiologist. PAT staff to follow along with recommendations based on entered documentation. Will need official documentation from cardiologist (office note vs clearance form) indicating clearance and overall risk stratification as it relates to planned elective surgical intervention. Also, we still need device clearance form completed and returned for perioperative management of patient's AICD.   Quentin Mulling, MSN, APRN, FNP-C, CEN [facility]  Perioperative Services Nurse Practitioner Phone: [phone] Fax: [phone] 08/21/23 1:45 PM  NOTE: This note has been prepared using Dragon dictation software. Despite my best ability to proofread, there is always the potential that unintentional transcriptional errors may still occur from this process.

## 2023-08-22 DIAGNOSIS — Z86718 Personal history of other venous thrombosis and embolism: Secondary | ICD-10-CM | POA: Diagnosis not present

## 2023-08-22 DIAGNOSIS — Z96652 Presence of left artificial knee joint: Secondary | ICD-10-CM | POA: Diagnosis not present

## 2023-08-22 DIAGNOSIS — G40309 Generalized idiopathic epilepsy and epileptic syndromes, not intractable, without status epilepticus: Secondary | ICD-10-CM | POA: Diagnosis not present

## 2023-08-22 DIAGNOSIS — R9431 Abnormal electrocardiogram [ECG] [EKG]: Secondary | ICD-10-CM | POA: Diagnosis not present

## 2023-08-22 DIAGNOSIS — E669 Obesity, unspecified: Secondary | ICD-10-CM | POA: Diagnosis not present

## 2023-08-22 DIAGNOSIS — I110 Hypertensive heart disease with heart failure: Secondary | ICD-10-CM | POA: Diagnosis not present

## 2023-08-22 DIAGNOSIS — I428 Other cardiomyopathies: Secondary | ICD-10-CM | POA: Diagnosis not present

## 2023-08-22 DIAGNOSIS — I5022 Chronic systolic (congestive) heart failure: Secondary | ICD-10-CM | POA: Diagnosis not present

## 2023-08-22 DIAGNOSIS — Z4502 Encounter for adjustment and management of automatic implantable cardiac defibrillator: Secondary | ICD-10-CM | POA: Diagnosis not present

## 2023-08-22 DIAGNOSIS — Z90710 Acquired absence of both cervix and uterus: Secondary | ICD-10-CM | POA: Diagnosis not present

## 2023-08-23 DIAGNOSIS — Z4502 Encounter for adjustment and management of automatic implantable cardiac defibrillator: Secondary | ICD-10-CM | POA: Diagnosis not present

## 2023-08-23 LAB — LATEX, IGE: Latex: <0.1 kU/L

## 2023-08-28 ENCOUNTER — Encounter: Payer: Self-pay | Admitting: Urgent Care

## 2023-08-28 ENCOUNTER — Encounter: Admission: RE | Payer: Self-pay | Source: Home / Self Care

## 2023-08-28 ENCOUNTER — Ambulatory Visit: Admission: RE | Admit: 2023-08-28 | Payer: Medicare HMO | Source: Home / Self Care | Admitting: Orthopedic Surgery

## 2023-08-28 DIAGNOSIS — Z91040 Latex allergy status: Secondary | ICD-10-CM

## 2023-08-28 DIAGNOSIS — D509 Iron deficiency anemia, unspecified: Secondary | ICD-10-CM

## 2023-08-28 DIAGNOSIS — D3A8 Other benign neuroendocrine tumors: Secondary | ICD-10-CM

## 2023-08-28 DIAGNOSIS — M1711 Unilateral primary osteoarthritis, right knee: Secondary | ICD-10-CM

## 2023-08-28 DIAGNOSIS — E119 Type 2 diabetes mellitus without complications: Secondary | ICD-10-CM

## 2023-08-28 SURGERY — ARTHROPLASTY, KNEE, TOTAL, USING IMAGELESS COMPUTER-ASSISTED NAVIGATION
Anesthesia: Choice | Site: Knee | Laterality: Right

## 2023-08-29 DIAGNOSIS — R9431 Abnormal electrocardiogram [ECG] [EKG]: Secondary | ICD-10-CM | POA: Diagnosis not present

## 2023-08-29 DIAGNOSIS — Z95810 Presence of automatic (implantable) cardiac defibrillator: Secondary | ICD-10-CM | POA: Diagnosis not present

## 2023-09-04 ENCOUNTER — Encounter: Payer: Self-pay | Admitting: Orthopedic Surgery

## 2023-09-05 DIAGNOSIS — M1711 Unilateral primary osteoarthritis, right knee: Secondary | ICD-10-CM | POA: Diagnosis not present

## 2023-09-11 DIAGNOSIS — I5042 Chronic combined systolic (congestive) and diastolic (congestive) heart failure: Secondary | ICD-10-CM | POA: Diagnosis not present

## 2023-09-11 DIAGNOSIS — Z45018 Encounter for adjustment and management of other part of cardiac pacemaker: Secondary | ICD-10-CM | POA: Diagnosis not present

## 2023-09-18 ENCOUNTER — Emergency Department
Admission: EM | Admit: 2023-09-18 | Discharge: 2023-09-18 | Disposition: A | Discharge status: Home / Self Care | Payer: Medicare HMO | Source: Home / Self Care | Attending: Emergency Medicine | Admitting: Emergency Medicine

## 2023-09-18 ENCOUNTER — Other Ambulatory Visit: Payer: Self-pay

## 2023-09-18 DIAGNOSIS — I110 Hypertensive heart disease with heart failure: Secondary | ICD-10-CM | POA: Insufficient documentation

## 2023-09-18 DIAGNOSIS — I509 Heart failure, unspecified: Secondary | ICD-10-CM | POA: Insufficient documentation

## 2023-09-18 DIAGNOSIS — Z20822 Contact with and (suspected) exposure to covid-19: Secondary | ICD-10-CM | POA: Insufficient documentation

## 2023-09-18 DIAGNOSIS — R000 Tachycardia, unspecified: Secondary | ICD-10-CM | POA: Diagnosis not present

## 2023-09-18 DIAGNOSIS — J101 Influenza due to other identified influenza virus with other respiratory manifestations: Secondary | ICD-10-CM

## 2023-09-18 DIAGNOSIS — J449 Chronic obstructive pulmonary disease, unspecified: Secondary | ICD-10-CM | POA: Insufficient documentation

## 2023-09-18 DIAGNOSIS — J09X2 Influenza due to identified novel influenza A virus with other respiratory manifestations: Secondary | ICD-10-CM | POA: Insufficient documentation

## 2023-09-18 LAB — RESP PANEL BY RT-PCR (RSV, FLU A&B, COVID)  RVPGX2
Influenza A by PCR: POSITIVE — AB
Influenza B by PCR: NEGATIVE
Resp Syncytial Virus by PCR: NEGATIVE
SARS Coronavirus 2 by RT PCR: NEGATIVE

## 2023-09-18 MED ORDER — OSELTAMIVIR PHOSPHATE 75 MG PO CAPS
75.0000 mg | ORAL_CAPSULE | Freq: Once | ORAL | Status: AC
  Administered 2023-09-18: 75 mg via ORAL
  Filled 2023-09-18 (×2): qty 1

## 2023-09-18 MED ORDER — GUAIFENESIN-CODEINE 100-10 MG/5ML PO SOLN: 5.0000 mL | Freq: Four times a day (QID) | ORAL | 0 refills | Status: DC | PRN

## 2023-09-18 MED ORDER — OSELTAMIVIR PHOSPHATE 75 MG PO CAPS: 75.0000 mg | ORAL_CAPSULE | Freq: Two times a day (BID) | ORAL | 0 refills | Status: DC

## 2023-09-18 MED ORDER — HYDROCOD POLI-CHLORPHE POLI ER 10-8 MG/5ML PO SUER
5.0000 mL | Freq: Once | ORAL | Status: AC
  Administered 2023-09-18: 5 mL via ORAL
  Filled 2023-09-18: qty 5

## 2023-09-18 MED ORDER — IBUPROFEN 600 MG PO TABS
600.0000 mg | ORAL_TABLET | Freq: Once | ORAL | Status: AC
  Administered 2023-09-18: 600 mg via ORAL
  Filled 2023-09-18: qty 1

## 2023-09-18 NOTE — ED Provider Notes (Signed)
 [facility] Provider Note    Event Date/Time   First MD Initiated Contact with Patient 09/18/23 2018     (approximate)  History   Chief Complaint: URI  HPI  Brittany Nguyen is a 58 y.o. female with a past medical history of COPD, CHF with implanted pacemaker/defibrillator, hypertension, seizures, presents to the emergency department for 2 days of cough congestion fever and headache.  According to the patient since yesterday she has had a fever has been coughing experiencing congestion and headache.  Patient also states a seizure disorder and earlier today had a small seizure which she relates to her fever.  Patient took Tylenol  at home 2 hours prior to arrival.  Physical Exam   Triage Vital Signs: ED Triage Vitals  Encounter Vitals Group     BP 09/18/23 1802 121/73     Systolic BP Percentile --      Diastolic BP Percentile --      Pulse Rate 09/18/23 1802 (!) 114     Resp 09/18/23 1802 20     Temp 09/18/23 1802 (!) 102.4 F (39.1 C)     Temp Source 09/18/23 1802 Oral     SpO2 --      Weight 09/18/23 1800 207 lb 14.3 oz (94.3 kg)     Height --      Head Circumference --      Peak Flow --      Pain Score 09/18/23 1800 8     Pain Loc --      Pain Education --      Exclude from Growth Chart --     Most recent vital signs: Vitals:   09/18/23 1802  BP: 121/73  Pulse: (!) 114  Resp: 20  Temp: (!) 102.4 F (39.1 C)    General: Awake, no distress.  CV:  Good peripheral perfusion.  Regular rate and rhythm  Resp:  Normal effort.  Equal breath sounds bilaterally.  Slight expiratory wheeze. Abd:  No distention.   ED Results / Procedures / Treatments   EKG  EKG viewed and interpreted by myself shows sinus tachycardia 110 bpm with a widened QRS, normal axis, normal intervals, nonspecific ST changes.  MEDICATIONS ORDERED IN ED: Medications  oseltamivir  (TAMIFLU ) capsule 75 mg (has no administration in time range)   chlorpheniramine-HYDROcodone  (TUSSIONEX) 10-8 MG/5ML suspension 5 mL (has no administration in time range)  ibuprofen  (ADVIL ) tablet 600 mg (600 mg Oral Given 09/18/23 1808)     IMPRESSION / MDM / ASSESSMENT AND PLAN / ED COURSE  I reviewed the triage vital signs and the nursing notes.  Patient's presentation is most consistent with acute presentation with potential threat to life or bodily function.  Patient presents to the emergency department for 2 days of cough congestion headache fever.  Patient is febrile to 102.4 on arrival.  Frequent cough during my evaluation but overall clear lung sounds with just very slight expiratory wheezes patient has a history of COPD.  Patient's EKG shows no concerning findings patient's respiratory panel is positive for influenza A.  Discussed the pros and cons of Tamiflu  patient wishes to proceed with Tamiflu .  Will prescribe Tamiflu  as well as a cough medication.  Otherwise I discussed supportive care as well as my typical influenza return precautions.  Patient agreeable to plan.  FINAL CLINICAL IMPRESSION(S) / ED DIAGNOSES   Influenza A   Note:  This document was prepared using Dragon voice recognition software and may include unintentional dictation errors.  Dorothyann Drivers, MD 09/18/23 2041

## 2023-09-18 NOTE — ED Triage Notes (Signed)
 Cough, congestion, SOB, body aches, painful cough x 1 day.  Temp 102 at home. Tylenol taken 2 hours PTA

## 2023-09-20 ENCOUNTER — Other Ambulatory Visit: Payer: Self-pay

## 2023-09-20 ENCOUNTER — Emergency Department
Admission: EM | Admit: 2023-09-20 | Discharge: 2023-09-20 | Discharge status: Against Medical Advice | Payer: Medicare HMO | Source: Home / Self Care

## 2023-09-20 ENCOUNTER — Encounter: Payer: Self-pay | Admitting: Intensive Care

## 2023-09-20 ENCOUNTER — Emergency Department: Payer: Medicare HMO

## 2023-09-20 DIAGNOSIS — Z5321 Procedure and treatment not carried out due to patient leaving prior to being seen by health care provider: Secondary | ICD-10-CM | POA: Insufficient documentation

## 2023-09-20 DIAGNOSIS — R059 Cough, unspecified: Secondary | ICD-10-CM | POA: Insufficient documentation

## 2023-09-20 DIAGNOSIS — R0602 Shortness of breath: Secondary | ICD-10-CM | POA: Diagnosis not present

## 2023-09-20 DIAGNOSIS — Z95810 Presence of automatic (implantable) cardiac defibrillator: Secondary | ICD-10-CM | POA: Diagnosis not present

## 2023-09-20 LAB — COMPREHENSIVE METABOLIC PANEL
ALT: 30 U/L (ref 0–44)
AST: 62 U/L — ABNORMAL HIGH (ref 15–41)
Albumin: 3.6 g/dL (ref 3.5–5.0)
Alkaline Phosphatase: 75 U/L (ref 38–126)
Anion gap: 12 (ref 5–15)
BUN: 17 mg/dL (ref 6–20)
CO2: 28 mmol/L (ref 22–32)
Calcium: 7 mg/dL — ABNORMAL LOW (ref 8.9–10.3)
Chloride: 97 mmol/L — ABNORMAL LOW (ref 98–111)
Creatinine, Ser: 1.53 mg/dL — ABNORMAL HIGH (ref 0.44–1.00)
GFR, Estimated: 40 mL/min — ABNORMAL LOW (ref >60)
Glucose, Bld: 145 mg/dL — ABNORMAL HIGH (ref 70–99)
Potassium: 3.1 mmol/L — ABNORMAL LOW (ref 3.5–5.1)
Sodium: 137 mmol/L (ref 135–145)
Total Bilirubin: 0.5 mg/dL (ref 0.0–1.2)
Total Protein: 7 g/dL (ref 6.5–8.1)

## 2023-09-20 LAB — CBC WITH DIFFERENTIAL/PLATELET
Abs Immature Granulocytes: 0.02 10*3/uL (ref 0.00–0.07)
Basophils Absolute: 0 10*3/uL (ref 0.0–0.1)
Basophils Relative: 0 %
Eosinophils Absolute: 0.2 10*3/uL (ref 0.0–0.5)
Eosinophils Relative: 3 %
HCT: 35.9 % — ABNORMAL LOW (ref 36.0–46.0)
Hemoglobin: 11.4 g/dL — ABNORMAL LOW (ref 12.0–15.0)
Immature Granulocytes: 0 %
Lymphocytes Relative: 33 %
Lymphs Abs: 1.8 10*3/uL (ref 0.7–4.0)
MCH: 28.9 pg (ref 26.0–34.0)
MCHC: 31.8 g/dL (ref 30.0–36.0)
MCV: 90.9 fL (ref 80.0–100.0)
Monocytes Absolute: 0.4 10*3/uL (ref 0.1–1.0)
Monocytes Relative: 7 %
Neutro Abs: 3.1 10*3/uL (ref 1.7–7.7)
Neutrophils Relative %: 57 %
Platelets: 171 10*3/uL (ref 150–400)
RBC: 3.95 MIL/uL (ref 3.87–5.11)
RDW: 15.1 % (ref 11.5–15.5)
WBC: 5.6 10*3/uL (ref 4.0–10.5)
nRBC: 0 % (ref 0.0–0.2)

## 2023-09-20 LAB — LACTIC ACID, PLASMA: Lactic Acid, Venous: 1.2 mmol/L (ref 0.5–1.9)

## 2023-09-20 LAB — TROPONIN I (HIGH SENSITIVITY): Troponin I (High Sensitivity): 6 ng/L (ref <18)

## 2023-09-20 LAB — BRAIN NATRIURETIC PEPTIDE: B Natriuretic Peptide: 21.5 pg/mL (ref 0.0–100.0)

## 2023-09-20 NOTE — ED Provider Triage Note (Signed)
Emergency Medicine Provider Triage Evaluation Note  Brittany Nguyen , a 58 y.o. female  was evaluated in triage.  Pt complains of dx of flu two days ago, on tamiflu, feeling worse. Took advil two hours ago.  Review of Systems  Positive: Cough, weakness, aches Negative: Abd pain  Physical Exam  BP 93/65 (BP Location: Left Arm)   Pulse 71   Temp 97.9 F (36.6 C) (Oral)   Resp 20   Ht 5\' 5"  (1.651 m)   Wt 90.7 kg   LMP 01/22/2012 (Approximate) Comment: Hysterectomy 5 years ago  SpO2 93%   BMI 33.28 kg/m  Gen:   Awake, no distress   Resp:  Normal effort  MSK:   Moves extremities without difficulty  Other:    Medical Decision Making  Medically screening exam initiated at 11:41 AM.  Appropriate orders placed.  Brittany Nguyen was informed that the remainder of the evaluation will be completed by another provider, this initial triage assessment does not replace that evaluation, and the importance of remaining in the ED until their evaluation is complete.     Jackelyn Hoehn, PA-C 09/20/23 1145

## 2023-09-20 NOTE — ED Triage Notes (Signed)
 Patient diagnosed with flu 09/18/23. Husband reports ER wanted to admit patient and she refused. Patient is back c/o emesis, aching all over and fevers.   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 Husband reports she gets a shot once a month.

## 2023-09-20 NOTE — ED Notes (Signed)
 Family member up to the First Nurse Desk, family states they no longer want to wait and will follow up with the pt's PCP.

## 2023-09-21 ENCOUNTER — Other Ambulatory Visit: Payer: Self-pay

## 2023-09-21 ENCOUNTER — Inpatient Hospital Stay: Payer: Medicare HMO

## 2023-09-21 ENCOUNTER — Emergency Department: Payer: Medicare HMO

## 2023-09-21 ENCOUNTER — Inpatient Hospital Stay
Admission: EM | Admit: 2023-09-21 | Discharge: 2023-09-23 | DRG: 191 | Disposition: A | Discharge status: Other Acute Inpatient Hospital | Payer: Medicare HMO | Attending: Internal Medicine | Admitting: Internal Medicine

## 2023-09-21 ENCOUNTER — Encounter: Payer: Self-pay | Admitting: Emergency Medicine

## 2023-09-21 DIAGNOSIS — Z515 Encounter for palliative care: Secondary | ICD-10-CM | POA: Diagnosis not present

## 2023-09-21 DIAGNOSIS — J111 Influenza due to unidentified influenza virus with other respiratory manifestations: Secondary | ICD-10-CM

## 2023-09-21 DIAGNOSIS — G2581 Restless legs syndrome: Secondary | ICD-10-CM | POA: Diagnosis present

## 2023-09-21 DIAGNOSIS — G4733 Obstructive sleep apnea (adult) (pediatric): Secondary | ICD-10-CM

## 2023-09-21 DIAGNOSIS — Z86718 Personal history of other venous thrombosis and embolism: Secondary | ICD-10-CM

## 2023-09-21 DIAGNOSIS — I509 Heart failure, unspecified: Secondary | ICD-10-CM

## 2023-09-21 DIAGNOSIS — Z96652 Presence of left artificial knee joint: Secondary | ICD-10-CM | POA: Diagnosis present

## 2023-09-21 DIAGNOSIS — E119 Type 2 diabetes mellitus without complications: Secondary | ICD-10-CM | POA: Diagnosis present

## 2023-09-21 DIAGNOSIS — G40201 Localization-related (focal) (partial) symptomatic epilepsy and epileptic syndromes with complex partial seizures, not intractable, with status epilepticus: Secondary | ICD-10-CM | POA: Diagnosis not present

## 2023-09-21 DIAGNOSIS — Z1152 Encounter for screening for COVID-19: Secondary | ICD-10-CM | POA: Diagnosis not present

## 2023-09-21 DIAGNOSIS — I10 Essential (primary) hypertension: Secondary | ICD-10-CM | POA: Diagnosis not present

## 2023-09-21 DIAGNOSIS — I252 Old myocardial infarction: Secondary | ICD-10-CM

## 2023-09-21 DIAGNOSIS — E669 Obesity, unspecified: Secondary | ICD-10-CM | POA: Diagnosis present

## 2023-09-21 DIAGNOSIS — I110 Hypertensive heart disease with heart failure: Secondary | ICD-10-CM | POA: Diagnosis present

## 2023-09-21 DIAGNOSIS — K219 Gastro-esophageal reflux disease without esophagitis: Secondary | ICD-10-CM | POA: Diagnosis present

## 2023-09-21 DIAGNOSIS — I428 Other cardiomyopathies: Secondary | ICD-10-CM | POA: Diagnosis present

## 2023-09-21 DIAGNOSIS — F418 Other specified anxiety disorders: Secondary | ICD-10-CM | POA: Diagnosis present

## 2023-09-21 DIAGNOSIS — Z95810 Presence of automatic (implantable) cardiac defibrillator: Secondary | ICD-10-CM

## 2023-09-21 DIAGNOSIS — Z880 Allergy status to penicillin: Secondary | ICD-10-CM | POA: Diagnosis not present

## 2023-09-21 DIAGNOSIS — I5022 Chronic systolic (congestive) heart failure: Secondary | ICD-10-CM | POA: Diagnosis present

## 2023-09-21 DIAGNOSIS — R5381 Other malaise: Secondary | ICD-10-CM | POA: Diagnosis present

## 2023-09-21 DIAGNOSIS — R4182 Altered mental status, unspecified: Secondary | ICD-10-CM | POA: Diagnosis not present

## 2023-09-21 DIAGNOSIS — J101 Influenza due to other identified influenza virus with other respiratory manifestations: Secondary | ICD-10-CM | POA: Diagnosis not present

## 2023-09-21 DIAGNOSIS — Z90710 Acquired absence of both cervix and uterus: Secondary | ICD-10-CM

## 2023-09-21 DIAGNOSIS — F32A Depression, unspecified: Secondary | ICD-10-CM | POA: Diagnosis present

## 2023-09-21 DIAGNOSIS — J441 Chronic obstructive pulmonary disease with (acute) exacerbation: Secondary | ICD-10-CM | POA: Diagnosis not present

## 2023-09-21 DIAGNOSIS — I5042 Chronic combined systolic (congestive) and diastolic (congestive) heart failure: Secondary | ICD-10-CM | POA: Diagnosis present

## 2023-09-21 DIAGNOSIS — R062 Wheezing: Secondary | ICD-10-CM | POA: Diagnosis not present

## 2023-09-21 DIAGNOSIS — F419 Anxiety disorder, unspecified: Secondary | ICD-10-CM | POA: Diagnosis present

## 2023-09-21 DIAGNOSIS — G40909 Epilepsy, unspecified, not intractable, without status epilepticus: Secondary | ICD-10-CM

## 2023-09-21 DIAGNOSIS — Z883 Allergy status to other anti-infective agents status: Secondary | ICD-10-CM

## 2023-09-21 DIAGNOSIS — I480 Paroxysmal atrial fibrillation: Secondary | ICD-10-CM | POA: Diagnosis present

## 2023-09-21 DIAGNOSIS — Z884 Allergy status to anesthetic agent status: Secondary | ICD-10-CM

## 2023-09-21 DIAGNOSIS — Z7951 Long term (current) use of inhaled steroids: Secondary | ICD-10-CM

## 2023-09-21 DIAGNOSIS — E039 Hypothyroidism, unspecified: Secondary | ICD-10-CM | POA: Diagnosis not present

## 2023-09-21 DIAGNOSIS — I2510 Atherosclerotic heart disease of native coronary artery without angina pectoris: Secondary | ICD-10-CM | POA: Diagnosis present

## 2023-09-21 DIAGNOSIS — Z79890 Hormone replacement therapy (postmenopausal): Secondary | ICD-10-CM | POA: Diagnosis not present

## 2023-09-21 DIAGNOSIS — E1169 Type 2 diabetes mellitus with other specified complication: Secondary | ICD-10-CM | POA: Diagnosis not present

## 2023-09-21 DIAGNOSIS — Z8249 Family history of ischemic heart disease and other diseases of the circulatory system: Secondary | ICD-10-CM | POA: Diagnosis not present

## 2023-09-21 DIAGNOSIS — R0789 Other chest pain: Secondary | ICD-10-CM | POA: Diagnosis not present

## 2023-09-21 DIAGNOSIS — R069 Unspecified abnormalities of breathing: Secondary | ICD-10-CM | POA: Diagnosis not present

## 2023-09-21 DIAGNOSIS — Z95828 Presence of other vascular implants and grafts: Secondary | ICD-10-CM | POA: Diagnosis not present

## 2023-09-21 DIAGNOSIS — Z91040 Latex allergy status: Secondary | ICD-10-CM

## 2023-09-21 DIAGNOSIS — R0902 Hypoxemia: Secondary | ICD-10-CM | POA: Diagnosis not present

## 2023-09-21 DIAGNOSIS — R569 Unspecified convulsions: Secondary | ICD-10-CM | POA: Diagnosis not present

## 2023-09-21 DIAGNOSIS — Z79899 Other long term (current) drug therapy: Secondary | ICD-10-CM

## 2023-09-21 DIAGNOSIS — R079 Chest pain, unspecified: Secondary | ICD-10-CM | POA: Diagnosis not present

## 2023-09-21 DIAGNOSIS — Z8614 Personal history of Methicillin resistant Staphylococcus aureus infection: Secondary | ICD-10-CM

## 2023-09-21 DIAGNOSIS — J449 Chronic obstructive pulmonary disease, unspecified: Secondary | ICD-10-CM | POA: Diagnosis not present

## 2023-09-21 DIAGNOSIS — E890 Postprocedural hypothyroidism: Secondary | ICD-10-CM | POA: Diagnosis present

## 2023-09-21 DIAGNOSIS — Z6833 Body mass index (BMI) 33.0-33.9, adult: Secondary | ICD-10-CM

## 2023-09-21 DIAGNOSIS — G40409 Other generalized epilepsy and epileptic syndromes, not intractable, without status epilepticus: Secondary | ICD-10-CM | POA: Diagnosis present

## 2023-09-21 DIAGNOSIS — Z79891 Long term (current) use of opiate analgesic: Secondary | ICD-10-CM

## 2023-09-21 DIAGNOSIS — R0602 Shortness of breath: Secondary | ICD-10-CM | POA: Diagnosis not present

## 2023-09-21 DIAGNOSIS — Z853 Personal history of malignant neoplasm of breast: Secondary | ICD-10-CM

## 2023-09-21 DIAGNOSIS — Z91041 Radiographic dye allergy status: Secondary | ICD-10-CM

## 2023-09-21 DIAGNOSIS — D649 Anemia, unspecified: Secondary | ICD-10-CM | POA: Diagnosis not present

## 2023-09-21 LAB — HEPATIC FUNCTION PANEL
ALT: 28 U/L (ref 0–44)
AST: 49 U/L — ABNORMAL HIGH (ref 15–41)
Albumin: 3.8 g/dL (ref 3.5–5.0)
Alkaline Phosphatase: 84 U/L (ref 38–126)
Bilirubin, Direct: <0.1 mg/dL (ref 0.0–0.2)
Total Bilirubin: 0.4 mg/dL (ref 0.0–1.2)
Total Protein: 7.5 g/dL (ref 6.5–8.1)

## 2023-09-21 LAB — CBC
HCT: 37.5 % (ref 36.0–46.0)
Hemoglobin: 12.1 g/dL (ref 12.0–15.0)
MCH: 29.2 pg (ref 26.0–34.0)
MCHC: 32.3 g/dL (ref 30.0–36.0)
MCV: 90.4 fL (ref 80.0–100.0)
Platelets: 206 10*3/uL (ref 150–400)
RBC: 4.15 MIL/uL (ref 3.87–5.11)
RDW: 15.3 % (ref 11.5–15.5)
WBC: 9.6 10*3/uL (ref 4.0–10.5)
nRBC: 0 % (ref 0.0–0.2)

## 2023-09-21 LAB — BLOOD GAS, VENOUS
Acid-Base Excess: 8.3 mmol/L — ABNORMAL HIGH (ref 0.0–2.0)
Bicarbonate: 36.4 mmol/L — ABNORMAL HIGH (ref 20.0–28.0)
Patient temperature: 37
pCO2, Ven: 66 mm[Hg] — ABNORMAL HIGH (ref 44–60)
pH, Ven: 7.35 (ref 7.25–7.43)

## 2023-09-21 LAB — TROPONIN I (HIGH SENSITIVITY)
Troponin I (High Sensitivity): 6 ng/L (ref <18)
Troponin I (High Sensitivity): 5 ng/L (ref <18)
Troponin I (High Sensitivity): 6 ng/L (ref <18)

## 2023-09-21 LAB — BASIC METABOLIC PANEL
Anion gap: 11 (ref 5–15)
BUN: 15 mg/dL (ref 6–20)
CO2: 30 mmol/L (ref 22–32)
Calcium: 7.7 mg/dL — ABNORMAL LOW (ref 8.9–10.3)
Chloride: 96 mmol/L — ABNORMAL LOW (ref 98–111)
Creatinine, Ser: 1.36 mg/dL — ABNORMAL HIGH (ref 0.44–1.00)
GFR, Estimated: 46 mL/min — ABNORMAL LOW (ref >60)
Glucose, Bld: 227 mg/dL — ABNORMAL HIGH (ref 70–99)
Potassium: 3.5 mmol/L (ref 3.5–5.1)
Sodium: 137 mmol/L (ref 135–145)

## 2023-09-21 LAB — LACTIC ACID, PLASMA
Lactic Acid, Venous: 1.9 mmol/L (ref 0.5–1.9)
Lactic Acid, Venous: 3.1 mmol/L (ref 0.5–1.9)
Lactic Acid, Venous: 5.5 mmol/L (ref 0.5–1.9)

## 2023-09-21 LAB — BLOOD GAS, ARTERIAL
Acid-Base Excess: 3.3 mmol/L — ABNORMAL HIGH (ref 0.0–2.0)
Bicarbonate: 28.5 mmol/L — ABNORMAL HIGH (ref 20.0–28.0)
O2 Content: 2 L/min
O2 Saturation: 97.6 %
Patient temperature: 37
pCO2 arterial: 45 mm[Hg] (ref 32–48)
pH, Arterial: 7.41 (ref 7.35–7.45)
pO2, Arterial: 84 mm[Hg] (ref 83–108)

## 2023-09-21 LAB — CBG MONITORING, ED: Glucose-Capillary: 288 mg/dL — ABNORMAL HIGH (ref 70–99)

## 2023-09-21 LAB — BRAIN NATRIURETIC PEPTIDE: B Natriuretic Peptide: 20.6 pg/mL (ref 0.0–100.0)

## 2023-09-21 MED ORDER — ENOXAPARIN SODIUM 40 MG/0.4ML IJ SOSY: 40.0000 mg | PREFILLED_SYRINGE | INTRAMUSCULAR | Status: DC

## 2023-09-21 MED ORDER — LACOSAMIDE 50 MG PO TABS
150.0000 mg | ORAL_TABLET | Freq: Two times a day (BID) | ORAL | Status: DC
  Administered 2023-09-21 – 2023-09-22 (×3): 150 mg via ORAL
  Filled 2023-09-21 (×4): qty 3

## 2023-09-21 MED ORDER — METHADONE HCL 5 MG PO TABS
7.5000 mg | ORAL_TABLET | ORAL | Status: DC
Start: 2023-09-21 — End: 2023-09-21

## 2023-09-21 MED ORDER — METHADONE HCL 10 MG PO TABS
10.0000 mg | ORAL_TABLET | Freq: Every day | ORAL | Status: DC
  Administered 2023-09-21 – 2023-09-22 (×2): 10 mg via ORAL
  Filled 2023-09-21 (×5): qty 1

## 2023-09-21 MED ORDER — OSELTAMIVIR PHOSPHATE 75 MG PO CAPS
75.0000 mg | ORAL_CAPSULE | Freq: Two times a day (BID) | ORAL | Status: DC
  Administered 2023-09-21: 75 mg via ORAL
  Filled 2023-09-21 (×2): qty 1

## 2023-09-21 MED ORDER — SPIRONOLACTONE 25 MG PO TABS
50.0000 mg | ORAL_TABLET | Freq: Two times a day (BID) | ORAL | Status: DC
Start: 2023-09-21 — End: 2023-09-23
  Administered 2023-09-21 – 2023-09-23 (×4): 50 mg via ORAL
  Filled 2023-09-21 (×4): qty 2

## 2023-09-21 MED ORDER — IPRATROPIUM-ALBUTEROL 0.5-2.5 (3) MG/3ML IN SOLN
3.0000 mL | Freq: Once | RESPIRATORY_TRACT | Status: AC
  Administered 2023-09-21: 3 mL via RESPIRATORY_TRACT
  Filled 2023-09-21: qty 3

## 2023-09-21 MED ORDER — METHYLPREDNISOLONE SODIUM SUCC 40 MG IJ SOLR
40.0000 mg | Freq: Two times a day (BID) | INTRAMUSCULAR | Status: AC
  Administered 2023-09-21 – 2023-09-22 (×2): 40 mg via INTRAVENOUS
  Filled 2023-09-21 (×2): qty 1

## 2023-09-21 MED ORDER — LOSARTAN POTASSIUM 25 MG PO TABS
12.5000 mg | ORAL_TABLET | Freq: Every day | ORAL | Status: DC
  Administered 2023-09-22 – 2023-09-23 (×2): 12.5 mg via ORAL
  Filled 2023-09-21 (×3): qty 0.5

## 2023-09-21 MED ORDER — LACTATED RINGERS IV BOLUS: 500.0000 mL | Freq: Once | INTRAVENOUS | Status: DC

## 2023-09-21 MED ORDER — LEVETIRACETAM IN NACL 1000 MG/100ML IV SOLN
1000.0000 mg | Freq: Once | INTRAVENOUS | Status: AC
  Administered 2023-09-21: 1000 mg via INTRAVENOUS
  Filled 2023-09-21: qty 100

## 2023-09-21 MED ORDER — LEVOTHYROXINE SODIUM 175 MCG PO TABS
175.0000 ug | ORAL_TABLET | Freq: Every morning | ORAL | Status: DC
  Administered 2023-09-22 – 2023-09-23 (×2): 175 ug via ORAL
  Filled 2023-09-21 (×2): qty 1

## 2023-09-21 MED ORDER — ALBUTEROL SULFATE (2.5 MG/3ML) 0.083% IN NEBU
3.0000 mL | INHALATION_SOLUTION | Freq: Once | RESPIRATORY_TRACT | Status: AC
  Administered 2023-09-21: 3 mL via RESPIRATORY_TRACT
  Filled 2023-09-21: qty 3

## 2023-09-21 MED ORDER — LORAZEPAM 2 MG/ML IJ SOLN
2.0000 mg | Freq: Once | INTRAMUSCULAR | Status: DC | PRN
Start: 2023-09-21 — End: 2023-09-21

## 2023-09-21 MED ORDER — ROPINIROLE HCL 1 MG PO TABS
4.0000 mg | ORAL_TABLET | Freq: Every day | ORAL | Status: DC
  Administered 2023-09-21 – 2023-09-22 (×2): 4 mg via ORAL
  Filled 2023-09-21 (×2): qty 4

## 2023-09-21 MED ORDER — ALBUTEROL SULFATE (2.5 MG/3ML) 0.083% IN NEBU: 2.5000 mg | INHALATION_SOLUTION | RESPIRATORY_TRACT | Status: DC | PRN

## 2023-09-21 MED ORDER — LACTATED RINGERS IV BOLUS
1000.0000 mL | Freq: Once | INTRAVENOUS | Status: AC
  Administered 2023-09-21: 1000 mL via INTRAVENOUS

## 2023-09-21 MED ORDER — LACTATED RINGERS IV SOLN
INTRAVENOUS | Status: AC
Start: 2023-09-21 — End: 2023-09-22

## 2023-09-21 MED ORDER — METHYLPREDNISOLONE SODIUM SUCC 125 MG IJ SOLR
125.0000 mg | Freq: Once | INTRAMUSCULAR | Status: AC
  Administered 2023-09-21: 125 mg via INTRAVENOUS
  Filled 2023-09-21: qty 2

## 2023-09-21 MED ORDER — IPRATROPIUM-ALBUTEROL 0.5-2.5 (3) MG/3ML IN SOLN
3.0000 mL | Freq: Four times a day (QID) | RESPIRATORY_TRACT | Status: DC
  Administered 2023-09-21 – 2023-09-22 (×6): 3 mL via RESPIRATORY_TRACT
  Filled 2023-09-21 (×6): qty 3

## 2023-09-21 MED ORDER — LORAZEPAM 2 MG/ML IJ SOLN
1.0000 mg | Freq: Once | INTRAMUSCULAR | Status: AC | PRN
  Administered 2023-09-22: 1 mg via INTRAVENOUS
  Filled 2023-09-21: qty 1

## 2023-09-21 MED ORDER — METHADONE HCL 5 MG PO TABS
7.5000 mg | ORAL_TABLET | Freq: Two times a day (BID) | ORAL | Status: DC
  Administered 2023-09-22 – 2023-09-23 (×3): 7.5 mg via ORAL
  Filled 2023-09-21: qty 2

## 2023-09-21 MED ORDER — KETOROLAC TROMETHAMINE 15 MG/ML IJ SOLN
15.0000 mg | Freq: Once | INTRAMUSCULAR | Status: AC
  Administered 2023-09-21: 15 mg via INTRAVENOUS
  Filled 2023-09-21: qty 1

## 2023-09-21 MED ORDER — ACETAMINOPHEN 500 MG PO TABS
1000.0000 mg | ORAL_TABLET | Freq: Once | ORAL | Status: AC
  Administered 2023-09-21: 1000 mg via ORAL
  Filled 2023-09-21: qty 2

## 2023-09-21 MED ORDER — LACTATED RINGERS IV SOLN: INTRAVENOUS | Status: DC

## 2023-09-21 MED ORDER — DOXYCYCLINE HYCLATE 100 MG IV SOLR
100.0000 mg | Freq: Once | INTRAVENOUS | Status: DC
  Administered 2023-09-21: 100 mg via INTRAVENOUS
  Filled 2023-09-21: qty 100

## 2023-09-21 MED ORDER — LACTATED RINGERS IV BOLUS
1700.0000 mL | Freq: Once | INTRAVENOUS | Status: AC
  Administered 2023-09-21: 1700 mL via INTRAVENOUS

## 2023-09-21 MED ORDER — ENOXAPARIN SODIUM 60 MG/0.6ML IJ SOSY
0.5000 mg/kg | PREFILLED_SYRINGE | INTRAMUSCULAR | Status: DC
  Administered 2023-09-21 – 2023-09-22 (×2): 45 mg via SUBCUTANEOUS
  Filled 2023-09-21 (×2): qty 0.6

## 2023-09-21 MED ORDER — PREDNISONE 20 MG PO TABS
40.0000 mg | ORAL_TABLET | Freq: Every day | ORAL | Status: DC
  Administered 2023-09-23: 40 mg via ORAL
  Filled 2023-09-21: qty 2

## 2023-09-21 MED ORDER — LEVETIRACETAM IN NACL 1000 MG/100ML IV SOLN
1000.0000 mg | Freq: Once | INTRAVENOUS | Status: DC
  Filled 2023-09-21: qty 100

## 2023-09-21 MED ORDER — SODIUM CHLORIDE 0.9 % IV SOLN
1.0000 g | INTRAVENOUS | Status: DC
  Administered 2023-09-21 – 2023-09-22 (×2): 1 g via INTRAVENOUS
  Filled 2023-09-21 (×3): qty 10

## 2023-09-21 NOTE — Assessment & Plan Note (Signed)
Cont lexapro 

## 2023-09-21 NOTE — H&P (Signed)
History and Physical    Patient: Brittany Nguyen NUU:[id number] DOB: [birth year] DOA: 09/21/2023 DOS: the patient was seen and examined on 09/21/2023 PCP: Brittany Mina, MD  Patient coming from: Home  Chief Complaint:  Chief Complaint  Patient presents with   Shortness of Breath   HPI: Brittany Nguyen is a 58 y.o. female with medical history significant of QTc prolongation, hypertension, type 2  diabetes, COPD/asthma, CAD, CHF with EF 45% status post AICD, hypothyroidism, GERD, depression, DVT status post IVC filter, carcinoid tumor of the stomach, chronic pain presenting COPD exacerbation, influenza A.  Patient reports increased work of breathing, cough wheezing and sputum production with past 3 to 4 days.  Noted to have been evaluated roughly 3 days ago.  Noted to be influenza A positive.  Patient reports intermittently use of Tamiflu.  Worsening respiratory status despite home inhalers.  No fevers or chills.  Positive generalized malaise and cough.  Mild abdominal pain. Presented to the ER afebrile, hemodynamically stable.  Satting well on room air.  White count 9.6, hemoglobin 12.1, platelets 206, lactate 3.1-->1.6, VBG stable, troponin within normal limits.  Creatinine 1.36, glucose 227.  BNP within normal limits. CXR WNL.  Review of Systems: As mentioned in the history of present illness. All other systems reviewed and are negative. Past Medical History:  Diagnosis Date   Acute anterolateral wall MI (HCC) 08/2016   Acute respiratory failure (HCC)    AICD (automatic cardioverter/defibrillator) present 03/29/2017   a.) Medtronic Evera MRI XT DR SureScan (SN: [id number])   Anxiety    a.) on BZO PRN (lorazepam)   Arthritis    Asthma 2013   Breast cancer, right (HCC)    C. 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   a.) Bx 02/22/2018 --> NE tumor cells --> AE1/AE3, chromogranin, synaptophysin (+); Ki-67 proliferation rate <1%; b.)  repeat Bx 08/24/2018 --> well differentiard NET (G1)   CHF (congestive heart failure) (HCC)    Chronic, continuous use of opioids    a.) has naloxone Rx available   COPD (chronic obstructive pulmonary disease) (HCC)    Coronary artery disease    Depression    Diet-controlled type 2 diabetes mellitus (HCC)    Diverticulitis 2010   DVT (deep venous thrombosis) (HCC)    Endometriosis 1990   Generalized epilepsy (HCC)    a.) on lacosamide; b.) last seizure 07-30-22 in setting of hypokalemia (K+ 2.6)   GERD (gastroesophageal reflux disease)    GIB (gastrointestinal bleeding) 01/08/2018   H/O syncope    HFrEF (heart failure with reduced ejection fraction) (HCC)    a.)TTE 12/09/16: EF 25-30%, dif HK; b.) TTE 02/08/17: EF 25%, dif HK, mild TR, G1DD; c.) TTE 04/25/17: EF 30-35%, mild LVH; d.) TTE 01/30/18: EF 45%, MAC, LAE, mild MR, G1DD; e.) TTE 11/06/2018: EF 25%, sev glob HK, triv MR/TR/PR; f.) TTE 05/12/19: EF 35%, MAC, AoV sclerosis, G1DD; g.) TTE 11/05/19: EF 20%, sev glob HK, triv MR; h.) TTE 06/17/20: EF 40%, mod glob HK, triv MR; I:) TTE 06/14/22: EF 25-30%, G1DD   History of cardiac catheterization    a.) LHC 10/21/2014: normal coronaries; b.) R/LHC 02/24/2017: normal coronaries, mRA 12, mPA 28, mPCWP 15, CO 8.0, CI 3.8   History of kidney stones    Hx MRSA infection    Hypertension    Hypokalemia    Hypothyroidism    Iron deficiency anemia 03/27/2018   Lump or mass in breast 11/27/2012  a.) Bx 11/27/2012 --> apocrine wall cyst   Migraine    Nausea & vomiting    Neuropathy    Non-ischemic cardiomyopathy (HCC)    a.) TTE 12/09/2016: EF 25-30%, b.) TTE 02/08/2017: EF 25%; c.) Medtronic AICD placed 03/29/2017; d.) TTE 04/25/2017: EF 30-35%; e.) TTE 01/30/2018: EF 45%; f.) TTE 11/06/2018: EF 25%; g.) TTE 05/12/2019: EF 35%; h.) TTE 11/05/2019: EF 20%; I.) TTE 06/17/2020: EF 40%; J.) TTE 06/14/2022: EF 25-30%   Obesity    OSA on CPAP    PAF (paroxysmal atrial fibrillation) (HCC)    Pneumonia     CAP   PONV (postoperative nausea and vomiting)    Post-COVID chronic cough    Postcholecystectomy diarrhea    Prolonged Q-T interval on ECG    RAD (reactive airway disease)    Restless leg    a.) on ropinirole   Seizure (HCC)    Sepsis (HCC)    Unstable angina (HCC)    Past Surgical History:  Procedure Laterality Date   ABDOMINAL HYSTERECTOMY     age 8   BREAST BIOPSY Right 2014   benign   BREAST BIOPSY Right 01/18/2023   [place] Core Bx, coil clip - path pending   BREAST BIOPSY Right 01/18/2023   [place] RT BREAST BX W LOC DEV 1ST LESION IMG BX SPEC US GUIDE 01/18/2023 [facility]   CARDIAC DEFIBRILLATOR PLACEMENT Left 03/29/2017   Procedure: MEDTRONIC CARDIAC DEFIBRILLATOR PLACEMENT (AICD); Location: [facility]; Surgeon: Bonne Dolores, MD   CESAREAN SECTION     CHOLECYSTECTOMY     COLECTOMY     COLONOSCOPY WITH PROPOFOL N/A 02/22/2018   Procedure: COLONOSCOPY WITH PROPOFOL;  Surgeon: Toledo, Boykin Nearing, MD;  Location: [facility];  Service: Gastroenterology;  Laterality: N/A;   ESOPHAGOGASTRODUODENOSCOPY (EGD) WITH PROPOFOL N/A 02/22/2018   Procedure: ESOPHAGOGASTRODUODENOSCOPY (EGD) WITH PROPOFOL;  Surgeon: Toledo, Boykin Nearing, MD;  Location: [facility];  Service: Gastroenterology;  Laterality: N/A;   IVC FILTER INSERTION N/A 08/23/2022   Procedure: IVC FILTER INSERTION;  Surgeon: Renford Dills, MD;  Location: [facility];  Service: Cardiovascular;  Laterality: N/A;   IVC FILTER REMOVAL N/A 03/07/2023   Procedure: IVC FILTER REMOVAL;  Surgeon: Renford Dills, MD;  Location: [facility];  Service: Cardiovascular;  Laterality: N/A;   KNEE ARTHROPLASTY Left 08/24/2022   Procedure: COMPUTER ASSISTED TOTAL KNEE ARTHROPLASTY;  Surgeon: Donato Heinz, MD;  Location: [facility];  Service: Orthopedics;  Laterality: Left;   LEFT HEART CATH AND CORONARY ANGIOGRAPHY Left 10/21/2014   Procedure: LEFT HEART CATH AND CORONARY ANGIOGRAPHY; Location: [facility]; Surgeon: Arnoldo Hooker, MD   NASAL SINUS SURGERY  2012   OOPHORECTOMY     RIGHT/LEFT HEART CATH AND CORONARY ANGIOGRAPHY Bilateral 02/24/2017   Procedure: RIGHT/LEFT HEART CATH AND CORONARY ANGIOGRAPHY; Location: [facility]   THYROIDECTOMY     Social History:  reports that she has never smoked. She has never used smokeless tobacco. She reports that she does not drink alcohol and does not use drugs.  Allergies  Allergen Reactions   Contrast Media [Iodinated Contrast Media] Shortness Of Breath    Per CT report in 2014 pt had break through contrast reaction with hives and SOB following 13 hour prep. MSY    Ferumoxytol Nausea Only   Iron Sucrose Anaphylaxis   Lidocaine Hives   Metrizamide Shortness Of Breath   Penicillins Hives and Other (See Comments)    IgE = 4 (WNL) on 08/12/2022  ediate rash, facial/tongue/throat swelling, SOB or lightheadedness  with hypotension, tachy   Sacubitril-Valsartan     Sz (unclear if new start entresto vs hypoglycemia)   Isosorbide Nitrate Other (See Comments)    Headache   Latex Hives    IgE < 0.10 (WNL) on 08/21/2023   Ondansetron Other (See Comments)    Severe headache   Tizanidine Other (See Comments)    Feels altered   Povidone-Iodine Rash    Blistering rash   Pulmicort [Budesonide] Itching    Family History  Problem Relation Age of Onset   Breast cancer Mother    Cancer Mother 10       ovarian   CAD Mother    Cancer Father 53       brain   CAD Father    Cancer Daughter 51       skin   Breast cancer Maternal Aunt    Cancer Maternal Aunt 34       breast   Leukemia Paternal Grandfather    Breast cancer Cousin     Prior to Admission medications   Medication Sig Start Date End Date Taking? Authorizing Provider  albuterol (VENTOLIN HFA) 108 (90 Base) MCG/ACT inhaler Inhale 1-2 puffs into the lungs every 6 (six) hours as needed for wheezing or shortness of breath.   Yes [provider]  beclomethasone (QVAR) 80 MCG/ACT inhaler Inhale 2 puffs into  the lungs 2 (two) times daily as needed (SOB and wheezing).   Yes [provider]  escitalopram (LEXAPRO) 10 MG tablet Take 10 mg by mouth daily. 11/24/22  Yes [provider]  gabapentin (NEURONTIN) 300 MG capsule Take 1 capsule (300 mg total) by mouth at bedtime. Patient taking differently: Take 300 mg by mouth 3 (three) times daily. 09/20/22  Yes Sunnie Nielsen, DO  guaiFENesin-codeine 100-10 MG/5ML syrup Take 5 mLs by mouth every 6 (six) hours as needed for cough. 09/18/23  Yes Minna Antis, MD  Lacosamide 100 MG TABS Take 150 mg by mouth 2 (two) times daily.   Yes [provider]  levothyroxine (SYNTHROID) 175 MCG tablet Take 175 mcg by mouth every morning. 12/17/21  Yes [provider]  LORazepam (ATIVAN) 1 MG tablet Take 1 tablet (1 mg total) by mouth 4 (four) times daily as needed. 11/10/21  Yes Arnetha Courser, MD  losartan (COZAAR) 25 MG tablet Take 12.5 mg by mouth in the morning and at bedtime. 11/15/22  Yes [provider]  methadone (DOLOPHINE) 5 MG tablet Take 7.5-10 mg by mouth See admin instructions. Take 7.5 mg by mouth in the morning and afternoon and take 10 mg at bedtime. 08/09/23  Yes [provider]  metoprolol succinate (TOPROL-XL) 50 MG 24 hr tablet Take 50 mg by mouth 2 (two) times daily. 02/16/22  Yes [provider]  naloxone (NARCAN) nasal spray 4 mg/0.1 mL Place 1 spray into the nose as needed for opioid reversal. May repeat once in opposite nare. 10/05/21  Yes [provider]  nitroGLYCERIN (NITROSTAT) 0.4 MG SL tablet Place 0.4 mg under the tongue every 5 (five) minutes x 3 doses as needed for chest pain. 11/23/21  Yes [provider]  oxyCODONE-acetaminophen (PERCOCET) 7.5-325 MG tablet Take 1 tablet by mouth every 4 (four) hours as needed for moderate pain (pain score 4-6).   Yes [provider]  Potassium Chloride ER 20 MEQ TBCR Take 1 tablet by mouth in the morning and at bedtime.  09/20/22  Yes Sunnie Nielsen, DO  promethazine (PHENERGAN) 25 MG tablet Take 25  mg by mouth every 4 (four) hours as needed for nausea/vomiting. 12/13/21  Yes [provider]  rOPINIRole (REQUIP) 4 MG tablet Take 4 mg by mouth at bedtime. 01/30/19  Yes [provider]  spironolactone (ALDACTONE) 50 MG tablet Take 50 mg by mouth 2 (two) times daily. 11/09/22  Yes [provider]  torsemide (DEMADEX) 20 MG tablet Take 40 mg by mouth 2 (two) times daily. 08/11/23  Yes [provider]  Ubrogepant 100 MG TABS Take 100 mg by mouth daily as needed (migraine). 04/11/23 04/10/24 Yes [provider]  NON FORMULARY Pt uses a cpap nightly    [provider]  oseltamivir (TAMIFLU) 75 MG capsule Take 1 capsule (75 mg total) by mouth 2 (two) times daily for 5 days. Patient not taking: Reported on 09/21/2023 09/18/23 09/23/23  Minna Antis, MD    Physical Exam: Vitals:   09/21/23 0951 09/21/23 1344 09/21/23 1408 09/21/23 1551  BP: 105/71 112/72    Pulse: 94 80    Resp: 18 18    Temp: 98.2 F (36.8 C)  98.5 F (36.9 C)   TempSrc: Oral  Oral   SpO2: 92% 96%  99%  Weight:      Height:       Physical Exam Constitutional:      Appearance: She is normal weight.  HENT:     Head: Normocephalic and atraumatic.     Nose: Nose normal.  Eyes:     Pupils: Pupils are equal, round, and reactive to light.  Cardiovascular:     Rate and Rhythm: Normal rate and regular rhythm.  Pulmonary:     Breath sounds: Wheezing present.     Comments: + mild to moderate increased WOB  Abdominal:     General: Bowel sounds are normal.  Musculoskeletal:        General: Normal range of motion.  Skin:    General: Skin is warm.  Neurological:     General: No focal deficit present.  Psychiatric:        Mood and Affect: Mood normal.     Data Reviewed:  There are no new results to review at this time.  DG Chest 2 View CLINICAL DATA:  COPD  EXAM: CHEST - 2  VIEW  COMPARISON:  09/20/2023  FINDINGS: Left-sided implanted cardiac device in stable positioning. The heart size and mediastinal contours are unchanged. Slightly low lung volumes. No focal airspace consolidation, pleural effusion, or pneumothorax. The visualized skeletal structures are unremarkable.  IMPRESSION: No active cardiopulmonary disease.  Electronically Signed   By: Duanne Guess D.O.   On: 09/21/2023 11:03  Lab Results  Component Value Date   WBC 9.6 09/21/2023   HGB 12.1 09/21/2023   HCT 37.5 09/21/2023   MCV 90.4 09/21/2023   PLT 206 09/21/2023   Last metabolic panel Lab Results  Component Value Date   GLUCOSE 227 (H) 09/21/2023   NA 137 09/21/2023   K 3.5 09/21/2023   CL 96 (L) 09/21/2023   CO2 30 09/21/2023   BUN 15 09/21/2023   CREATININE 1.36 (H) 09/21/2023   GFRNONAA 46 (L) 09/21/2023   CALCIUM 7.7 (L) 09/21/2023   PHOS 6.3 (H) 09/19/2022   PROT 7.5 09/21/2023   ALBUMIN 3.8 09/21/2023   BILITOT 0.4 09/21/2023   ALKPHOS 84 09/21/2023   AST 49 (H) 09/21/2023   ALT 28 09/21/2023   ANIONGAP 11 09/21/2023    Assessment and Plan: COPD (chronic obstructive pulmonary disease) (HCC) Influenza A  Acute  COPD Exacerbation in setting of influenza A (original dx 09/18/23) CXR WNL  No hypoxia at present  IV solumedrol  Duonebs  IV Rocephin  Cont tamiflu    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 01/2018 2D ECHO w/ EF 45% and grade 1 diastolic dysfunction   HTN (hypertension) BP stable  Titrate home regimen    Type 2 diabetes mellitus without complication (HCC) Blood sugars 200s  SSI  Monitor sugars w/ steroid use    Seizure disorder (HCC) Cont vimpat   Depression with anxiety Cont lexapro   OSA on CPAP CPAP  Essential hypertension BP stable  Titrate home regimen    Acquired hypothyroidism Cont synthroid    GERD PPI      Advance Care Planning:   Code Status: Full Code   Consults: None    Family Communication: No family at the bedside   Severity of Illness: The appropriate patient status for this patient is INPATIENT. Inpatient status is judged to be reasonable and necessary in order to provide the required intensity of service to ensure the patient's safety. The patient's presenting symptoms, physical exam findings, and initial radiographic and laboratory data in the context of their chronic comorbidities is felt to place them at high risk for further clinical deterioration. Furthermore, it is not anticipated that the patient will be medically stable for discharge from the hospital within 2 midnights of admission.   * I certify that at the point of admission it is my clinical judgment that the patient will require inpatient hospital care spanning beyond 2 midnights from the point of admission due to high intensity of service, high risk for further deterioration and high frequency of surveillance required.*  Author: Floydene Flock, MD 09/21/2023 4:36 PM  For on call review [email or web address].

## 2023-09-21 NOTE — Assessment & Plan Note (Signed)
Cont vimpat

## 2023-09-21 NOTE — ED Provider Triage Note (Signed)
Emergency Medicine Provider Triage Evaluation Note  Brittany Nguyen , a 58 y.o. female  was evaluated in triage.  Pt complains of shortness of breath.  Patient diagnosed with flu on 09/18/2023.  Came yesterday and left without being seen..  Review of Systems  Positive:  Negative:   Physical Exam  Ht 5\' 5"  (1.651 m)   Wt 90.7 kg   LMP 01/22/2012 (Approximate) Comment: Hysterectomy 5 years ago  BMI 33.28 kg/m  Gen:   Awake, no distress   Resp:  Normal effort  MSK:   Moves extremities without difficulty  Other:    Medical Decision Making  Medically screening exam initiated at 9:52 AM.  Appropriate orders placed.  Brittany Nguyen was informed that the remainder of the evaluation will be completed by another provider, this initial triage assessment does not replace that evaluation, and the importance of remaining in the ED until their evaluation is complete.     Brittany Ghee, PA-C 09/21/23 [phone]

## 2023-09-21 NOTE — Assessment & Plan Note (Signed)
Blood sugars 200s  SSI  Monitor sugars w/ steroid use

## 2023-09-21 NOTE — Assessment & Plan Note (Signed)
 Cont synthroid

## 2023-09-21 NOTE — Progress Notes (Addendum)
+   transient unresponsiveness hypotension noted by nursing  Had mild head shaking per nursing ?  generalized tonic- clonic movements noted by my colleague Dr. Huel Cote  Pt responsive at the bedside, though with wheezing and mild lethargy  BP 100s/70s   Noted prior dx seizure with concern for psychogenic component  Will check vimpat level  Keppra load x 1  Start LR MIVF  Recheck ABG x 1 Trop  CT head x 1  Case discussed w/ Dr. Wilford Corner Plan for formal neurology consultation in am

## 2023-09-21 NOTE — Assessment & Plan Note (Signed)
BP stable Titrate home regimen 

## 2023-09-21 NOTE — Assessment & Plan Note (Signed)
01/2018 2D ECHO w/ EF 45% and grade 1 diastolic dysfunction

## 2023-09-21 NOTE — Progress Notes (Signed)
       CROSS COVER NOTE  NAME: Denicia Manzer MRN: [id number] DOB : 1965/12/01 ATTENDING PHYSICIAN: Floydene Flock, MD    Date of Service   09/21/2023   HPI/Events of Note   Asymptomatic hypotension reported 81/43. Patient admitted with flu and had possible seizure ealier in day (known seizures and discussed with neuro by admitting physician).  Lactate done earlier in day increase to 3.1, no repeats. Only received 1 liter LR  Meets sepsis criteria with flu A as possible cause   Interventions   Assessment/Plan: To complete 30 ml /kg fluid recommendations give additional 1700 ml LR Trend lactic acid levels Follow up cultures       Donnie Mesa NP [facility] Cross Cover 7pm-7am - check amion for availability Pager [phone]

## 2023-09-21 NOTE — ED Provider Notes (Signed)
[facility] Provider Note    Event Date/Time   First MD Initiated Contact with Patient 09/21/23 1111     (approximate)   History   Shortness of Breath   HPI  Brittany Nguyen is a 58 y.o. female history of COPD, asthma, carcinoid tumor getting monthly injections, CHF, history of DVT not on anticoagulation due to GI bleeding with IVC filters, she is status post ICD placement, presenting with shortness of breath and persistent cough.  Per EMS, they were called because she was having shortness of breath, was diagnosed with flu on the 13th, was given 1 DuoNeb and 1 albuterol treatment with some improvement, was satting 95% on room air for them.  Per husband she had a temperature of 103 at home.  Has been taking her Tamiflu.  Patient denies any chest pain, had a 1 episode of diarrhea yesterday but no abdominal pain or nausea or vomiting at this time.   Independent review of prior notes and labs, she was influenza A 1/13, her BNP was normal, her troponins were also normal.   Physical Exam   Triage Vital Signs: ED Triage Vitals  Encounter Vitals Group     BP 09/21/23 0951 105/71     Systolic BP Percentile --      Diastolic BP Percentile --      Pulse Rate 09/21/23 0951 94     Resp 09/21/23 0951 18     Temp 09/21/23 0951 98.2 F (36.8 C)     Temp Source 09/21/23 0951 Oral     SpO2 09/21/23 0951 92 %     Weight 09/21/23 0949 200 lb (90.7 kg)     Height 09/21/23 0949 5\' 5"  (1.651 m)     Head Circumference --      Peak Flow --      Pain Score 09/21/23 0949 8     Pain Loc --      Pain Education --      Exclude from Growth Chart --     Most recent vital signs: Vitals:   09/21/23 1344 09/21/23 1408  BP: 112/72   Pulse: 80   Resp: 18   Temp:  98.5 F (36.9 C)  SpO2: 96%      General: Awake, no distress.  CV:  Good peripheral perfusion.  Resp:  Normal effort.  Rhonchorous bilaterally with sparse wheezing.  No increased work of  breathing Abd:  No distention.  Soft nontender Other:  No obvious lower extremity IMA, no unilateral calf swelling or tenderness.   ED Results / Procedures / Treatments   Labs (all labs ordered are listed, but only abnormal results are displayed) Labs Reviewed  BASIC METABOLIC PANEL - Abnormal; Notable for the following components:      Result Value   Chloride 96 (*)    Glucose, Bld 227 (*)    Creatinine, Ser 1.36 (*)    Calcium 7.7 (*)    GFR, Estimated 46 (*)    All other components within normal limits  HEPATIC FUNCTION PANEL - Abnormal; Notable for the following components:   AST 49 (*)    All other components within normal limits  BLOOD GAS, VENOUS - Abnormal; Notable for the following components:   pCO2, Ven 66 (*)    Bicarbonate 36.4 (*)    Acid-Base Excess 8.3 (*)    All other components within normal limits  LACTIC ACID, PLASMA - Abnormal; Notable for the following components:   Lactic Acid,  Venous 3.1 (*)    All other components within normal limits  CBC  LACTIC ACID, PLASMA  BRAIN NATRIURETIC PEPTIDE  TROPONIN I (HIGH SENSITIVITY)  TROPONIN I (HIGH SENSITIVITY)     EKG  Sinus rhythm, rate 94, normal QRS, prolonged QTc, no ischemic ST elevation, she has T wave flattening in 3, aVF, aVL, T wave changes appear new compared to prior   RADIOLOGY Chest x-ray on my read without obvious focal consolidation.   PROCEDURES:  Critical Care performed: Yes, see critical care procedure note(s)  .Critical Care  Performed by: Claybon Jabs, MD Authorized by: Claybon Jabs, MD   Critical care provider statement:    Critical care time (minutes):  34   Critical care was time spent personally by me on the following activities:  Development of treatment plan with patient or surrogate, discussions with consultants, evaluation of patient's response to treatment, examination of patient, ordering and review of laboratory studies, ordering and review of radiographic studies,  ordering and performing treatments and interventions, pulse oximetry, re-evaluation of patient's condition and review of old charts    MEDICATIONS ORDERED IN ED: Medications  doxycycline (VIBRAMYCIN) 100 mg in dextrose 5 % 250 mL IVPB (has no administration in time range)  ipratropium-albuterol (DUONEB) 0.5-2.5 (3) MG/3ML nebulizer solution 3 mL (3 mLs Nebulization Given 09/21/23 1201)  ipratropium-albuterol (DUONEB) 0.5-2.5 (3) MG/3ML nebulizer solution 3 mL (3 mLs Nebulization Given 09/21/23 1201)  methylPREDNISolone sodium succinate (SOLU-MEDROL) 125 mg/2 mL injection 125 mg (125 mg Intravenous Given 09/21/23 1153)  lactated ringers bolus 1,000 mL (1,000 mLs Intravenous New Bag/Given 09/21/23 1153)  acetaminophen (TYLENOL) tablet 1,000 mg (1,000 mg Oral Given 09/21/23 1200)  albuterol (PROVENTIL) (2.5 MG/3ML) 0.083% nebulizer solution 3 mL (3 mLs Nebulization Given 09/21/23 1407)  ketorolac (TORADOL) 15 MG/ML injection 15 mg (15 mg Intravenous Given 09/21/23 1404)     IMPRESSION / MDM / ASSESSMENT AND PLAN / ED COURSE  I reviewed the triage vital signs and the nursing notes.                              Differential diagnosis includes, but is not limited to, influenza, viral illness, COVID, pneumonia, COPD exacerbation, CHF, atypical ACS.  Will get labs, give patient IV fluids, repeat chest x-ray.  DuoNebs, Solu-Medrol.  Patient's presentation is most consistent with acute presentation with potential threat to life or bodily function.  Patient with history of COPD, CHF, carcinoid tumor getting monthly injections, history of DVT not anticoagulation presenting with shortness of breath and persistent cough, was rhonchorous bilaterally with some wheezing, treated as COPD exacerbation with some improvement to her shortness of breath but not completely back to baseline, after 1 DuoNeb by EMS and 2 DuoNebs by me, with steroids given, she still diffusely wheezing and short of breath.  Will give her  continuous albuterol nebulizer treatment.  On independent review of labs, her pCO2 is elevated but her pH is normal for VBG, LFTs are not significantly elevated, BNP is normal, she has a mild AKI since her creatinine is typically normal, however creatinine is also improving compared to yesterday, electrolytes are not severely deranged, no leukocytosis, H&H is stable.  Chest x-ray without focal consolidation.  Her lactate is 3, she is getting some IV fluids.  Given that she is persistently wheezing, short of breath, she is at high risk and will need to be admitted for further management.  Consult to hospitalist is  agreeable plan for admission and will evaluate the patient.  Shared decision making with patient and she is agreeable to plan.     FINAL CLINICAL IMPRESSION(S) / ED DIAGNOSES   Final diagnoses:  COPD exacerbation (HCC)  Influenza     Rx / DC Orders   ED Discharge Orders     None        Note:  This document was prepared using Dragon voice recognition software and may include unintentional dictation errors.    Claybon Jabs, MD 09/21/23 1520

## 2023-09-21 NOTE — ED Triage Notes (Signed)
Patient to ED via [facility] from home for SOB. Seen yesterday for same by [facility]. Dx with flu on 1/13. States had 103 fever at home. States she has stomach cancer and get infusions for same.

## 2023-09-21 NOTE — Assessment & Plan Note (Signed)
PPI ?

## 2023-09-21 NOTE — Progress Notes (Signed)
PHARMACIST - PHYSICIAN COMMUNICATION  CONCERNING:  Enoxaparin (Lovenox) for DVT Prophylaxis    RECOMMENDATION: Patient was prescribed enoxaprin 40mg  q24 hours for VTE prophylaxis.   Filed Weights   09/21/23 0949  Weight: 90.7 kg (200 lb)    Body mass index is 33.28 kg/m.  Estimated Creatinine Clearance: 52 mL/min (A) (by C-G formula based on SCr of 1.36 mg/dL (H)).   Based on [facility] policy patient is candidate for enoxaparin 0.5mg /kg TBW SQ every 24 hours based on BMI being >30.  DESCRIPTION: Pharmacy has adjusted enoxaparin dose per [facility] policy.  Patient is now receiving enoxaparin 45 mg every 24 hours    Foye Deer, PharmD Clinical Pharmacist  09/21/2023 4:28 PM

## 2023-09-21 NOTE — Progress Notes (Incomplete)
       CROSS COVER NOTE  NAME: Brittany Nguyen MRN: [id number] DOB : 08-Dec-1965 ATTENDING PHYSICIAN: Floydene Flock, MD    Date of Service   09/21/2023   HPI/Events of Note   Asymptomatic hypotension reported 81/43. Patient admitted with flu and had possible seizure ealier in day (known seizures and discussed with neuro by admitting physician).  Lactate done earlier in day increase to 3.1, no repeats. Only received 1 liter LR  Meets sepsis criteria with flu A as possible cause   Interventions   Assessment/Plan: To complete 30 ml /kg fluid recommendations give additional 1700 ml LR Trend lactic acid levels Follow up cultures

## 2023-09-21 NOTE — ED Triage Notes (Signed)
First Nurse Note:  Pt via [facility] from home. Pt c/o SOB. Was seen on 1/15 and 1/13. Pt has a hx of COPD. Pt was dx Flu on 1/13. Fire gave her 1 albuterol treatment & EMS gave 1 Duoneb. Pt is A&Ox4 and NAD  12 lead EKG unremarkable.  233/113 BP  228 CBG  97.3 oral  82 HR  95% on RA  20 G RAC

## 2023-09-21 NOTE — Assessment & Plan Note (Signed)
CPAP.  

## 2023-09-21 NOTE — Assessment & Plan Note (Signed)
Influenza A  Acute COPD Exacerbation in setting of influenza A (original dx 09/18/23) CXR WNL  No hypoxia at present  IV solumedrol  Duonebs  IV Rocephin  Cont tamiflu

## 2023-09-22 ENCOUNTER — Encounter (HOSPITAL_COMMUNITY): Payer: Self-pay

## 2023-09-22 ENCOUNTER — Inpatient Hospital Stay: Payer: Medicare HMO

## 2023-09-22 ENCOUNTER — Ambulatory Visit: Payer: Medicare HMO

## 2023-09-22 DIAGNOSIS — J111 Influenza due to unidentified influenza virus with other respiratory manifestations: Secondary | ICD-10-CM | POA: Diagnosis not present

## 2023-09-22 DIAGNOSIS — G40201 Localization-related (focal) (partial) symptomatic epilepsy and epileptic syndromes with complex partial seizures, not intractable, with status epilepticus: Secondary | ICD-10-CM

## 2023-09-22 DIAGNOSIS — F418 Other specified anxiety disorders: Secondary | ICD-10-CM | POA: Diagnosis not present

## 2023-09-22 DIAGNOSIS — G40909 Epilepsy, unspecified, not intractable, without status epilepticus: Secondary | ICD-10-CM | POA: Diagnosis not present

## 2023-09-22 DIAGNOSIS — G4733 Obstructive sleep apnea (adult) (pediatric): Secondary | ICD-10-CM

## 2023-09-22 DIAGNOSIS — I5042 Chronic combined systolic (congestive) and diastolic (congestive) heart failure: Secondary | ICD-10-CM | POA: Diagnosis not present

## 2023-09-22 DIAGNOSIS — I10 Essential (primary) hypertension: Secondary | ICD-10-CM | POA: Diagnosis not present

## 2023-09-22 DIAGNOSIS — Z515 Encounter for palliative care: Secondary | ICD-10-CM | POA: Diagnosis not present

## 2023-09-22 DIAGNOSIS — E119 Type 2 diabetes mellitus without complications: Secondary | ICD-10-CM

## 2023-09-22 DIAGNOSIS — J441 Chronic obstructive pulmonary disease with (acute) exacerbation: Secondary | ICD-10-CM

## 2023-09-22 DIAGNOSIS — J449 Chronic obstructive pulmonary disease, unspecified: Secondary | ICD-10-CM | POA: Diagnosis not present

## 2023-09-22 DIAGNOSIS — R569 Unspecified convulsions: Secondary | ICD-10-CM | POA: Diagnosis not present

## 2023-09-22 LAB — GLUCOSE, CAPILLARY
Glucose-Capillary: 162 mg/dL — ABNORMAL HIGH (ref 70–99)
Glucose-Capillary: 209 mg/dL — ABNORMAL HIGH (ref 70–99)
Glucose-Capillary: 255 mg/dL — ABNORMAL HIGH (ref 70–99)

## 2023-09-22 LAB — COMPREHENSIVE METABOLIC PANEL
ALT: 28 U/L (ref 0–44)
AST: 56 U/L — ABNORMAL HIGH (ref 15–41)
Albumin: 2.9 g/dL — ABNORMAL LOW (ref 3.5–5.0)
Alkaline Phosphatase: 72 U/L (ref 38–126)
Anion gap: 14 (ref 5–15)
BUN: 18 mg/dL (ref 6–20)
CO2: 25 mmol/L (ref 22–32)
Calcium: 7.4 mg/dL — ABNORMAL LOW (ref 8.9–10.3)
Chloride: 96 mmol/L — ABNORMAL LOW (ref 98–111)
Creatinine, Ser: 1.38 mg/dL — ABNORMAL HIGH (ref 0.44–1.00)
GFR, Estimated: 45 mL/min — ABNORMAL LOW (ref >60)
Glucose, Bld: 276 mg/dL — ABNORMAL HIGH (ref 70–99)
Potassium: 5.1 mmol/L (ref 3.5–5.1)
Sodium: 135 mmol/L (ref 135–145)
Total Bilirubin: 0.5 mg/dL (ref 0.0–1.2)
Total Protein: 6.6 g/dL (ref 6.5–8.1)

## 2023-09-22 LAB — HIV ANTIBODY (ROUTINE TESTING W REFLEX): HIV Screen 4th Generation wRfx: NONREACTIVE

## 2023-09-22 LAB — MRSA NEXT GEN BY PCR, NASAL: MRSA by PCR Next Gen: NOT DETECTED

## 2023-09-22 LAB — MAGNESIUM: Magnesium: 1.5 mg/dL — ABNORMAL LOW (ref 1.7–2.4)

## 2023-09-22 LAB — BLOOD GAS, VENOUS
Acid-Base Excess: 4 mmol/L — ABNORMAL HIGH (ref 0.0–2.0)
Bicarbonate: 29.7 mmol/L — ABNORMAL HIGH (ref 20.0–28.0)
O2 Saturation: 93.1 %
Patient temperature: 37
pCO2, Ven: 48 mm[Hg] (ref 44–60)
pH, Ven: 7.4 (ref 7.25–7.43)
pO2, Ven: 64 mm[Hg] — ABNORMAL HIGH (ref 32–45)

## 2023-09-22 LAB — CBC
HCT: 32.5 % — ABNORMAL LOW (ref 36.0–46.0)
Hemoglobin: 10.5 g/dL — ABNORMAL LOW (ref 12.0–15.0)
MCH: 28.5 pg (ref 26.0–34.0)
MCHC: 32.3 g/dL (ref 30.0–36.0)
MCV: 88.1 fL (ref 80.0–100.0)
Platelets: 172 10*3/uL (ref 150–400)
RBC: 3.69 MIL/uL — ABNORMAL LOW (ref 3.87–5.11)
RDW: 15.1 % (ref 11.5–15.5)
WBC: 7.2 10*3/uL (ref 4.0–10.5)
nRBC: 0 % (ref 0.0–0.2)

## 2023-09-22 LAB — LACTIC ACID, PLASMA
Lactic Acid, Venous: 6.8 mmol/L (ref 0.5–1.9)
Lactic Acid, Venous: 4.4 mmol/L (ref 0.5–1.9)
Lactic Acid, Venous: 6.2 mmol/L (ref 0.5–1.9)

## 2023-09-22 LAB — CBG MONITORING, ED
Glucose-Capillary: 222 mg/dL — ABNORMAL HIGH (ref 70–99)
Glucose-Capillary: 189 mg/dL — ABNORMAL HIGH (ref 70–99)
Glucose-Capillary: 246 mg/dL — ABNORMAL HIGH (ref 70–99)

## 2023-09-22 LAB — ALBUMIN: Albumin: 3 g/dL — ABNORMAL LOW (ref 3.5–5.0)

## 2023-09-22 MED ORDER — INSULIN ASPART 100 UNIT/ML IJ SOLN
0.0000 [IU] | Freq: Three times a day (TID) | INTRAMUSCULAR | Status: DC
Start: 2023-09-22 — End: 2023-09-22
  Administered 2023-09-22: 2 [IU] via SUBCUTANEOUS
  Administered 2023-09-22: 3 [IU] via SUBCUTANEOUS
  Filled 2023-09-22 (×2): qty 1

## 2023-09-22 MED ORDER — SODIUM CHLORIDE 0.9 % IV SOLN
2500.0000 mg | Freq: Once | INTRAVENOUS | Status: DC
  Filled 2023-09-22: qty 25

## 2023-09-22 MED ORDER — SODIUM CHLORIDE 0.9 % IV SOLN
200.0000 mg | Freq: Once | INTRAVENOUS | Status: AC
  Administered 2023-09-22: 200 mg via INTRAVENOUS
  Filled 2023-09-22: qty 20

## 2023-09-22 MED ORDER — INSULIN ASPART 100 UNIT/ML IJ SOLN
0.0000 [IU] | INTRAMUSCULAR | Status: DC
  Administered 2023-09-22: 8 [IU] via SUBCUTANEOUS
  Administered 2023-09-22: 3 [IU] via SUBCUTANEOUS
  Administered 2023-09-23 (×2): 2 [IU] via SUBCUTANEOUS
  Filled 2023-09-22 (×4): qty 1

## 2023-09-22 MED ORDER — PROMETHAZINE HCL 25 MG PO TABS
25.0000 mg | ORAL_TABLET | Freq: Three times a day (TID) | ORAL | Status: DC | PRN
  Administered 2023-09-22: 25 mg via ORAL
  Filled 2023-09-22 (×2): qty 1

## 2023-09-22 MED ORDER — LORAZEPAM 2 MG/ML IJ SOLN
INTRAMUSCULAR | Status: AC
  Administered 2023-09-22: 2 mg via INTRAVENOUS
  Filled 2023-09-22: qty 1

## 2023-09-22 MED ORDER — SODIUM CHLORIDE 0.9 % IV BOLUS
500.0000 mL | Freq: Once | INTRAVENOUS | Status: AC
  Administered 2023-09-22: 500 mL via INTRAVENOUS

## 2023-09-22 MED ORDER — SODIUM CHLORIDE 0.9 % IV SOLN
2500.0000 mg | Freq: Once | INTRAVENOUS | Status: AC
  Administered 2023-09-22: 2500 mg via INTRAVENOUS
  Filled 2023-09-22: qty 10

## 2023-09-22 MED ORDER — LORAZEPAM 2 MG/ML IJ SOLN
INTRAMUSCULAR | Status: AC
  Filled 2023-09-22: qty 1

## 2023-09-22 MED ORDER — VALPROATE SODIUM 100 MG/ML IV SOLN
350.0000 mg | Freq: Four times a day (QID) | INTRAVENOUS | Status: DC
  Administered 2023-09-22 – 2023-09-23 (×3): 350 mg via INTRAVENOUS
  Filled 2023-09-22 (×5): qty 3.5

## 2023-09-22 MED ORDER — ACETAMINOPHEN 325 MG PO TABS: 650.0000 mg | ORAL_TABLET | Freq: Once | ORAL | Status: DC

## 2023-09-22 MED ORDER — INSULIN ASPART 100 UNIT/ML IJ SOLN: 0.0000 [IU] | Freq: Every day | INTRAMUSCULAR | Status: DC

## 2023-09-22 MED ORDER — MAGNESIUM SULFATE 4 GM/100ML IV SOLN
4.0000 g | Freq: Once | INTRAVENOUS | Status: AC
  Administered 2023-09-22: 4 g via INTRAVENOUS
  Filled 2023-09-22: qty 100

## 2023-09-22 MED ORDER — LEVETIRACETAM IN NACL 500 MG/100ML IV SOLN: 500.0000 mg | Freq: Two times a day (BID) | INTRAVENOUS | Status: DC

## 2023-09-22 MED ORDER — VALPROATE SODIUM 100 MG/ML IV SOLN
1800.0000 mg | Freq: Once | INTRAVENOUS | Status: AC
  Administered 2023-09-22: 1800 mg via INTRAVENOUS
  Filled 2023-09-22: qty 18

## 2023-09-22 MED ORDER — ACETAMINOPHEN 325 MG PO TABS
650.0000 mg | ORAL_TABLET | Freq: Four times a day (QID) | ORAL | Status: DC | PRN
  Administered 2023-09-22 – 2023-09-23 (×3): 650 mg via ORAL
  Filled 2023-09-22 (×3): qty 2

## 2023-09-22 MED ORDER — LEVETIRACETAM IN NACL 1000 MG/100ML IV SOLN
1000.0000 mg | Freq: Two times a day (BID) | INTRAVENOUS | Status: DC
  Administered 2023-09-23: 1000 mg via INTRAVENOUS
  Filled 2023-09-22 (×2): qty 100

## 2023-09-22 MED ORDER — OXYCODONE-ACETAMINOPHEN 7.5-325 MG PO TABS
1.0000 | ORAL_TABLET | ORAL | Status: DC | PRN
  Administered 2023-09-22 – 2023-09-23 (×3): 1 via ORAL
  Filled 2023-09-22 (×3): qty 1

## 2023-09-22 MED ORDER — SODIUM CHLORIDE 0.9 % IV SOLN
200.0000 mg | Freq: Two times a day (BID) | INTRAVENOUS | Status: DC
  Administered 2023-09-22 – 2023-09-23 (×2): 200 mg via INTRAVENOUS
  Filled 2023-09-22 (×3): qty 20

## 2023-09-22 MED ORDER — CHLORHEXIDINE GLUCONATE CLOTH 2 % EX PADS
6.0000 | MEDICATED_PAD | Freq: Every day | CUTANEOUS | Status: DC
  Administered 2023-09-22: 6 via TOPICAL

## 2023-09-22 MED ORDER — LORAZEPAM 2 MG/ML IJ SOLN
INTRAMUSCULAR | Status: AC
  Administered 2023-09-22: 2 mg
  Filled 2023-09-22: qty 1

## 2023-09-22 MED ORDER — LEVETIRACETAM 500 MG/5ML IV SOLN
2000.0000 mg | Freq: Once | INTRAVENOUS | Status: AC
  Administered 2023-09-22: 2000 mg via INTRAVENOUS
  Filled 2023-09-22: qty 20

## 2023-09-22 MED ORDER — IPRATROPIUM-ALBUTEROL 0.5-2.5 (3) MG/3ML IN SOLN
3.0000 mL | Freq: Two times a day (BID) | RESPIRATORY_TRACT | Status: DC
  Administered 2023-09-23: 3 mL via RESPIRATORY_TRACT
  Filled 2023-09-22: qty 3

## 2023-09-22 MED ORDER — LORAZEPAM 2 MG/ML IJ SOLN: 2.0000 mg | Freq: Once | INTRAMUSCULAR | Status: AC

## 2023-09-22 MED ORDER — OSELTAMIVIR PHOSPHATE 30 MG PO CAPS
30.0000 mg | ORAL_CAPSULE | Freq: Two times a day (BID) | ORAL | Status: DC
Start: 2023-09-22 — End: 2023-09-23
  Administered 2023-09-22 (×2): 30 mg via ORAL
  Filled 2023-09-22 (×3): qty 1

## 2023-09-22 MED ORDER — LEVETIRACETAM IN NACL 1500 MG/100ML IV SOLN: 1500.0000 mg | Freq: Two times a day (BID) | INTRAVENOUS | Status: DC

## 2023-09-22 NOTE — Plan of Care (Signed)
This RN received call from NT indicating patient was having a seizure.  Upon entering the room, patient was unresponsive and looked to be trembling mainly upper body twitches.  Patient rolled on her side and pushed ativan as ordered.  Seizure lasted 4 minutes as patient opened her eyes.    Still non-verbal and unable to respond to instructions, patient began to tremble and closed her eyes again.  Throughout both these episodes, patients did not show signs of drooling or SPO2 concerns, but afterwards patient voiced concerns of her chest feeling heavy and she had also urinated in the bed. (She's normally continent).  Rapid called - Neuro, ICU chrg and Resp came to assist. 2nd dose ativan IV, bag of Vimpat and bag Keppra ordered and hung.

## 2023-09-22 NOTE — Progress Notes (Signed)
1      PROGRESS NOTE    Brittany Nguyen  ZOX:[id number] DOB: May 12, 1966 DOA: 09/21/2023 PCP: Jerl Mina, MD    Brief Narrative:   58 y.o. female with medical history significant of QTc prolongation, hypertension, type 2  diabetes, COPD/asthma, CAD, CHF with EF 45% status post AICD, hypothyroidism, GERD, depression, DVT status post IVC filter, carcinoid tumor of the stomach, chronic pain presenting COPD exacerbation, influenza A   1/17: Lactic acid continues to trend up. 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   Type 2 diabetes mellitus without complication (HCC)   HTN (hypertension)   Seizure disorder (HCC)   Depression with anxiety   OSA on CPAP   GERD   Acquired hypothyroidism   Essential hypertension  Recurrent Seizure One episode last evening - Keppra loaded and c/s neuro She had RRT this am for another seizure like activity and 3rd one this evening around 6 pm - neuro seen this am and loaded Keppra, and increased dose  - continue Vimpat (increased dose this am) - EEG now showing any epileptiform activity - CT head on 1/16 - nothing acute, repeated one STAT this evening per Neuro - transfer to [place] - unable to get MRI due to AICD - depakote load f/by maintenance as per Neuro orders - Ceribell monitoring overnight in ICU/SD - may need transfer to [facility] for continuous EEG monitoring when bed becomes available (none tonight per carelink). 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 01/2018 2D ECHO w/ EF 45% and grade 1 diastolic dysfunction    HTN (hypertension) BP stable  Titrate home regimen      Type 2 diabetes mellitus without complication (HCC) Blood sugars in 200s  SSI, DM nurse c/s Monitor sugars w/ steroid use     Depression with anxiety Cont lexapro    OSA on CPAP CPAP   Essential hypertension BP stable  Titrate home regimen      Acquired hypothyroidism Cont synthroid      GERD PPI   DVT prophylaxis: (Lovenox      Code Status: Full code Family Communication: (NO "discussed with patient") Disposition Plan: Possible D/C in 3-4 days depending on clinical condition   Consultants:  Neuro   Antimicrobials:   Rocephin    Subjective:  Was requesting something for nausea when I saw her and inquiring if she could go home. Was awake and alert in ED, SOB +  Objective: Vitals:   09/22/23 0538 09/22/23 0543 09/22/23 0919 09/22/23 0935  BP:   100/63   Pulse:   78   Resp:   15   Temp:  98.4 F (36.9 C)  97.9 F (36.6 C)  TempSrc:  Oral  Oral  SpO2: 93%  96%   Weight:      Height:       No intake or output data in the 24 hours  ending 09/22/23 1050 Filed Weights   09/21/23 0949  Weight: 90.7 kg    Examination:  General exam: Appears calm and comfortable  Respiratory system: Clear to auscultation. Respiratory effort normal. Cardiovascular system: S1 & S2 heard, RRR. No JVD, murmurs, rubs, gallops or clicks. No pedal edema. Gastrointestinal system: Abdomen is soft, benign Central nervous system: Alert and oriented. No focal neurological deficits. Extremities: Symmetric 5 x 5 power. Skin: No rashes, lesions or ulcers Psychiatry: Judgement and insight appear normal. Mood & affect appropriate.     Data Reviewed: I have personally reviewed following labs and imaging studies  CBC: Recent Labs   Lab 09/20/23 1143 09/21/23 0954 09/22/23 0200  WBC 5.6 9.6 7.2  NEUTROABS 3.1  --   --   HGB 11.4* 12.1 10.5*  HCT 35.9* 37.5 32.5*  MCV 90.9 90.4 88.1  PLT 171 206 172   Basic Metabolic Panel: Recent Labs  Lab 09/20/23 1143 09/21/23 0954 09/22/23 0200  NA 137 137 135  K 3.1* 3.5 5.1  CL 97* 96* 96*  CO2 28 30 25   GLUCOSE 145* 227* 276*  BUN 17 15 18   CREATININE 1.53* 1.36* 1.38*  CALCIUM 7.0* 7.7* 7.4*   GFR: Estimated Creatinine Clearance: 51.3 mL/min (A) (by C-G formula based on SCr of 1.38 mg/dL (H)). Liver Function Tests: Recent Labs  Lab 09/20/23 1143 09/21/23 1157 09/22/23 0200  AST 62* 49* 56*  ALT 30 28 28   ALKPHOS 75 84 72  BILITOT 0.5 0.4 0.5  PROT 7.0 7.5 6.6  ALBUMIN 3.6 3.8 3.0*  2.9*   No results for input(s): "LIPASE", "AMYLASE" in the last 168 hours. No results for input(s): "AMMONIA" in the last 168 hours. Coagulation Profile: No results for input(s): "INR", "PROTIME" in the last 168 hours. Cardiac Enzymes: No results for input(s): "CKTOTAL", "CKMB", "CKMBINDEX", "TROPONINI" in the last 168 hours. BNP (last 3 results) No results for input(s): "PROBNP" in the last 8760 hours. HbA1C: No results for input(s): "HGBA1C" in the last 72 hours. CBG: Recent Labs  Lab 09/21/23 2310 09/22/23 0843  GLUCAP 288* 222*   Lipid Profile: No results for input(s): "CHOL", "HDL", "LDLCALC", "TRIG", "CHOLHDL", "LDLDIRECT" in the last 72 hours. Thyroid Function Tests: No results for input(s): "TSH", "T4TOTAL", "FREET4", "T3FREE", "THYROIDAB" in the last 72 hours. Anemia Panel: No results for input(s): "VITAMINB12", "FOLATE", "FERRITIN", "TIBC", "IRON", "RETICCTPCT" in the last 72 hours. Sepsis Labs: Recent Labs  Lab 09/21/23 1157 09/21/23 1332 09/21/23 2325 09/22/23 0200  LATICACIDVEN 1.9 3.1* 5.5* 6.8*    Recent Results (from the past 240 hours)  Resp panel by RT-PCR (RSV, Flu A&B, Covid) Anterior Nasal Swab     Status: Abnormal   Collection  Time: 09/18/23  6:02 PM   Specimen: Anterior Nasal Swab  Result Value Ref Range Status   SARS Coronavirus 2 by RT PCR NEGATIVE NEGATIVE Final    Comment: (NOTE) SARS-CoV-2 target nucleic acids are NOT DETECTED.  The SARS-CoV-2 RNA is generally detectable in upper respiratory specimens during the acute phase of infection. The lowest concentration of SARS-CoV-2 viral copies this assay can detect is 138 copies/mL. A negative result does not preclude SARS-Cov-2 infection and should not be used as the sole basis for treatment or other patient management decisions. A negative result may occur with  improper specimen collection/handling, submission of specimen other than nasopharyngeal swab, presence of viral mutation(s) within the areas targeted by this assay, and inadequate number of viral copies(<138 copies/mL). A negative result must be combined with  clinical observations, patient history, and epidemiological information. The expected result is Negative.  Fact Sheet for Patients:  BloggerCourse.com  Fact Sheet for Healthcare Providers:  SeriousBroker.it  This test is no t yet approved or cleared by the [place] FDA and  has been authorized for detection and/or diagnosis of SARS-CoV-2 by FDA under an Emergency Use Authorization (EUA). This EUA will remain  in effect (meaning this test can be used) for the duration of the COVID-19 declaration under Section 564(b)(1) of the Act, 21 U.S.C.section 360bbb-3(b)(1), unless the authorization is terminated  or revoked sooner.       Influenza A by PCR POSITIVE (A) NEGATIVE Final   Influenza B by PCR NEGATIVE NEGATIVE Final    Comment: (NOTE) The Xpert Xpress SARS-CoV-2/FLU/RSV plus assay is intended as an aid in the diagnosis of influenza from Nasopharyngeal swab specimens and should not be used as a sole basis for treatment. Nasal washings and aspirates are unacceptable for Xpert  Xpress SARS-CoV-2/FLU/RSV testing.  Fact Sheet for Patients: BloggerCourse.com  Fact Sheet for Healthcare Providers: SeriousBroker.it  This test is not yet approved or cleared by the [place] FDA and has been authorized for detection and/or diagnosis of SARS-CoV-2 by FDA under an Emergency Use Authorization (EUA). This EUA will remain in effect (meaning this test can be used) for the duration of the COVID-19 declaration under Section 564(b)(1) of the Act, 21 U.S.C. section 360bbb-3(b)(1), unless the authorization is terminated or revoked.     Resp Syncytial Virus by PCR NEGATIVE NEGATIVE Final    Comment: (NOTE) Fact Sheet for Patients: BloggerCourse.com  Fact Sheet for Healthcare Providers: SeriousBroker.it  This test is not yet approved or cleared by the [place] FDA and has been authorized for detection and/or diagnosis of SARS-CoV-2 by FDA under an Emergency Use Authorization (EUA). This EUA will remain in effect (meaning this test can be used) for the duration of the COVID-19 declaration under Section 564(b)(1) of the Act, 21 U.S.C. section 360bbb-3(b)(1), unless the authorization is terminated or revoked.  Performed at [facility], [address] [place], [place] [address]   Culture, blood (routine x 2)     Status: None (Preliminary result)   Collection Time: 09/20/23 11:41 AM   Specimen: BLOOD  Result Value Ref Range Status   Specimen Description BLOOD BLOOD RIGHT ARM  Final   Special Requests   Final    BOTTLES DRAWN AEROBIC AND ANAEROBIC Blood Culture adequate volume   Culture   Final    NO GROWTH 2 DAYS Performed at [facility], [address], [place], [place] [address]    Report Status PENDING  Incomplete         Radiology Studies: CT HEAD WO CONTRAST ( ) Result Date: 09/21/2023 CLINICAL DATA:  Mental status change  EXAM: CT HEAD WITHOUT CONTRAST TECHNIQUE: Contiguous axial images were obtained from the base of the skull through the vertex without intravenous contrast. RADIATION DOSE REDUCTION: This exam was performed according to the departmental dose-optimization program which includes automated exposure control, adjustment of the mA and/or kV according to patient size and/or use of iterative reconstruction technique. COMPARISON:  CT brain 05/26/2023 FINDINGS: Brain: No evidence of acute infarction, hemorrhage, hydrocephalus, extra-axial collection or mass lesion/mass effect. Vascular: No hyperdense vessels.  No unexpected calcification Skull: Normal. Negative for fracture or focal lesion. Sinuses/Orbits: No acute finding. Osteoma left frontal ethmoidal sinuses. Mucosal thickening in the maxillary and ethmoid sinuses Other: None IMPRESSION: Negative non contrasted CT appearance of the  brain. Electronically Signed   By: Jasmine Pang M.D.   On: 09/21/2023 17:41   DG Chest 2 View Result Date: 09/21/2023 CLINICAL DATA:  COPD EXAM: CHEST - 2 VIEW COMPARISON:  09/20/2023 FINDINGS: Left-sided implanted cardiac device in stable positioning. The heart size and mediastinal contours are unchanged. Slightly low lung volumes. No focal airspace consolidation, pleural effusion, or pneumothorax. The visualized skeletal structures are unremarkable. IMPRESSION: No active cardiopulmonary disease. Electronically Signed   By: Duanne Guess D.O.   On: 09/21/2023 11:03   DG Chest 2 View Result Date: 09/20/2023 CLINICAL DATA:  Shortness of breath. EXAM: CHEST - 2 VIEW COMPARISON:  Chest radiograph dated June 06, 2023. FINDINGS: The cardiomediastinal silhouette is unchanged. Stable left subclavian dual lead AICD in place. No focal consolidation, pleural effusion, or pneumothorax. No acute osseous abnormality. IMPRESSION: No acute cardiopulmonary findings. Electronically Signed   By: Hart Robinsons M.D.   On: 09/20/2023 13:45         Scheduled Meds:  enoxaparin (LOVENOX) injection  0.5 mg/kg Subcutaneous Q24H   insulin aspart  0-5 Units Subcutaneous QHS   insulin aspart  0-9 Units Subcutaneous TID WC   ipratropium-albuterol  3 mL Nebulization Q6H   lacosamide  150 mg Oral BID   levothyroxine  175 mcg Oral q morning   losartan  12.5 mg Oral Daily   methadone  10 mg Oral QHS   methadone  7.5 mg Oral BID   oseltamivir  30 mg Oral BID   [START ON 09/23/2023] predniSONE  40 mg Oral Q breakfast   rOPINIRole  4 mg Oral QHS   spironolactone  50 mg Oral BID   Continuous Infusions:  cefTRIAXone (ROCEPHIN)  IV Stopped (09/21/23 2028)   lactated ringers 100 mL/hr at 09/22/23 0149     LOS: 1 day    Time spent: 35 mins    Topanga Alvelo Sherryll Burger, MD [facility] Pager [phone]  If 7PM-7AM, please contact night-coverage [email or web address] Password [email or web address] 09/22/2023, 10:50 AM

## 2023-09-22 NOTE — Procedures (Signed)
History: 58 year old female being evaluated for possible seizures  Sedation: Ativan given earlier  Patient State: Awake and asleep  Technique: This EEG was acquired with electrodes placed according to the International 10-20 electrode system (including Fp1, Fp2, F3, F4, C3, C4, P3, P4, O1, O2, T3, T4, T5, T6, A1, A2, Fz, Cz, Pz). The following electrodes were missing or displaced: none.   Background: The background is relatively well-organized though there is a slow posterior dominant rhythm with a frequency of 6 to 7 Hz.  There is also mild intrusion of irregular slow activity in the delta and theta range.  Selectivity increases with drowsiness and sleep is recorded with symmetric appearing structures.  There is no epileptiform activity seen.  Photic stimulation: Physiologic driving is not performed  EEG Abnormalities: 1) generalized irregular slow activity 2) slow posterior dominant rhythm  Clinical Interpretation: This EEG is consistent with a mild generalized nonspecific cerebral dysfunction (encephalopathy). There was no seizure or seizure predisposition recorded on this study. Please note that lack of epileptiform activity on EEG does not preclude the possibility of epilepsy.   Ritta Slot, MD [facility] [phone]  If 7pm- 7am, please page neurology on call as listed in AMION.

## 2023-09-22 NOTE — Inpatient Diabetes Management (Signed)
Inpatient Diabetes Program Recommendations  AACE/ADA: New Consensus Statement on Inpatient Glycemic Control (2015)  Target Ranges:  Prepandial:   less than 140 mg/dL      Peak postprandial:   less than 180 mg/dL (1-2 hours)      Critically ill patients:  140 - 180 mg/dL   Lab Results  Component Value Date   GLUCAP 189 (H) 09/22/2023   HGBA1C 6.4 (H) 08/21/2023    Review of Glycemic Control  Latest Reference Range & Units 09/21/23 23:10 09/22/23 08:43 09/22/23 11:28  Glucose-Capillary 70 - 99 mg/dL 086 (H) 578 (H) 469 (H)   Diabetes history: DM 2 Outpatient Diabetes medications:  None Current orders for Inpatient glycemic control:  Novolog 0-9 units tid with meals and HS  Inpatient Diabetes Program Recommendations:    Agree with current orders.   Thanks,  Lorenza Cambridge, RN, BC-ADM Inpatient Diabetes Coordinator Pager [phone]  (8a-5p)

## 2023-09-22 NOTE — Consult Note (Signed)
Consultation Note Date: 09/22/2023   Patient Name: Brittany Nguyen  DOB: 02-Mar-1966  MRN: [id number]  Age / Sex: 58 y.o., female  PCP: Jerl Mina, MD Referring Physician: Delfino Lovett, MD  Reason for Consultation: Establishing goals of care   HPI/Brief Hospital Course: 58 y.o. female  with past medical history of CAD s/p AICD, HFrEF, COPD/asthma, chronic pain, carcinoid tumor of the stomach followed by [facility] oncology as well as Palliative Medicine, seizure disorder, T2DM, depression and HTN admitted from home on 09/21/2023 with Influenza A with worsening respiratory symptoms, being managed for AECOPD.  Course now complicated by seizure activity x2-now loaded with Keppra, Vimpat dose adjusted, neuro consulted, EEG pending   Palliative medicine was consulted for assisting with goals of care conversations.  Subjective:  Extensive chart review has been completed prior to meeting patient including labs, vital signs, imaging, progress notes, orders, and available advanced directive documents from current and previous encounters.  Visited with Ms. Simonetti at her bedside. EEG in progress, no family at bedside during time of visit.  Called and spoke with husband-William.  Introduced myself as a Publishing rights manager as a member of the palliative care team. Explained palliative medicine is specialized medical care for people living with serious illness. It focuses on providing relief from the symptoms and stress of a serious illness. The goal is to improve quality of life for both the patient and the family.   Chrissie Noa shares he is familiar with palliative medicine as Ms. Dimond is closely followed as an outpatient. Chrissie Noa shares a brief life review. He and Ms. Mcaffee have been married for 39 years, they have 2 grown adult children and 3 grandchildren.  Chrissie Noa provides a brief medical review and confirms Ms. Boggan is followed by many specialists through [facility] and  [facility] system.  We discussed patient's current illness and what it means in the larger context of patient's on-going co-morbidities. Natural disease trajectory and expectations at EOL were discussed.   Chrissie Noa is not aware of completed AD. He shares in the past Ms. Jakubiak has been clear she wishes to continue with all aggressive treatment.  Chrissie Noa shares since admission he feels Ms. Bierlein's pain has been well managed as she has been sedated due to seizure activity and medications.  I discussed importance of continued conversations with family/support persons and all members of their medical team regarding overall plan of care and treatment options ensuring decisions are in alignment with patients goals of care.  All questions/concerns addressed. Emotional support provided to patient/family/support persons. PMT will continue to follow and support patient as needed. Will attempt to visit with Ms. Stonerock later today or tomorrow morning.  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  HTN (hypertension)  Depression with anxiety  GERD  Essential hypertension  Acquired hypothyroidism   Vital Signs: BP 104/72 (BP Location: Left Arm)   Pulse 82   Temp 98.2 F (36.8 C) (Oral)   Resp 18   Ht 5\' 5"  (1.651 m)   Wt 90.7 kg   LMP 01/22/2012 (Approximate) Comment: Hysterectomy 5 years ago  SpO2 98%   BMI 33.28 kg/m  Pain Scale: 0-10   Pain Score: 8    IO: Intake/output summary: No intake or output data in the 24 hours ending 09/22/23 1516  LBM:   Baseline Weight: Weight: 90.7 kg Most recent weight: Weight: 90.7 kg  Assessment and Plan  SUMMARY OF RECOMMENDATIONS   Time for outcomes PMT to continue to follow for ongoing needs and support  Thank you for this consult and allowing Palliative Medicine to participate in the care  of Marveline L. Sarti. Palliative medicine will continue to follow and assist as needed.   Time Total: 75 minutes  Time spent includes: Detailed review of medical records (labs, imaging, vital signs), medically appropriate exam (mental status, respiratory, cardiac, skin), discussed with treatment team, counseling and educating patient, family and staff, documenting clinical information, medication management and coordination of care.   Signed by: Leeanne Deed, DNP, AGNP-C Palliative Medicine    Please contact Palliative Medicine Team phone at [phone] for questions and concerns.  For individual provider: See Loretha Stapler

## 2023-09-22 NOTE — Progress Notes (Signed)
Eeg done 

## 2023-09-22 NOTE — Consult Note (Addendum)
NEUROLOGY CONSULT NOTE   Date of service: September 22, 2023 Patient Name: Brittany Nguyen MRN:  [id number] DOB:  02-May-1966 Chief Complaint: breakthrough seizures Requesting Provider: Delfino Lovett, MD  History of Present Illness   This is a 58 year old woman with past medical history significant for QTc problem deviation, hypertension, type 2 diabetes, COPD/asthma, CAD, CHF with EF 45% status post AICD placement, hypothyroidism, depression, DVT status post IVC filter, carcinoid tumor of the stomach, chronic pain presenting with COPD exacerbation in the setting of influenza A.  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  As an outpatient she is on lacosamide 150 mg twice daily.  Yesterday after admission she had 1 episode concerning for seizure involving difficulty speaking and tremulousness.  She received a loading dose of Keppra at that time.  She had 2 RRTs today, 1 this morning and 1 for this evening for similar focal seizure-like activity.  Neurology saw her this morning and loaded 2 g of Keppra and continued her on maintenance.  After the second seizure this evening she was given another 2.5 g of Keppra to equal a 60 mg/kg load.  She was continued on 1 g every 12 which is the maximum dose given her renal function.  Home lacosamide was increased from 150 mg twice daily to 200 mg twice daily.  Routine EEG performed today showed mild diffuse slowing with no epileptiform activity.  CT head on admission showed nothing acute.  She is unable to get MRI at [facility] due to AICD. She is currently at mental status baseline although drowsy 2/2 ativan.   ROS   UTA 2/2 AMS  Past History   Past Medical History:  Diagnosis Date   Acute anterolateral wall MI (HCC) 08/2016   Acute respiratory failure (HCC)    AICD (automatic cardioverter/defibrillator) present 03/29/2017   a.)  Medtronic Evera MRI XT DR SureScan (SN: [id number])   Anxiety    a.) on BZO PRN (lorazepam)   Arthritis    Asthma 2013   Breast cancer, right (HCC)    C. 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   a.) Bx 02/22/2018 --> NE tumor cells --> AE1/AE3, chromogranin, synaptophysin (+); Ki-67 proliferation rate <1%; b.) repeat Bx 08/24/2018 --> well differentiard NET (G1)   CHF (congestive heart failure) (HCC)    Chronic, continuous use of opioids    a.) has naloxone Rx available   COPD (chronic obstructive pulmonary disease) (HCC)    Coronary artery disease    Depression    Diet-controlled type 2 diabetes mellitus (HCC)    Diverticulitis 2010   DVT (deep venous thrombosis) (HCC)    Endometriosis 1990   Generalized epilepsy (HCC)    a.) on lacosamide; b.) last seizure 07-30-22 in setting of hypokalemia (K+ 2.6)   GERD (gastroesophageal reflux disease)    GIB (gastrointestinal bleeding) 01/08/2018   H/O syncope    HFrEF (heart failure with reduced ejection fraction) (HCC)    a.)TTE 12/09/16: EF 25-30%, dif HK; b.) TTE 02/08/17: EF 25%, dif HK, mild TR, G1DD; c.) TTE 04/25/17: EF 30-35%, mild LVH; d.) TTE 01/30/18: EF 45%, MAC, LAE, mild MR, G1DD; e.) TTE 11/06/2018: EF 25%, sev glob HK, triv MR/TR/PR; f.) TTE 05/12/19: EF 35%, MAC, AoV sclerosis, G1DD; g.) TTE 11/05/19: EF 20%, sev glob HK, triv MR; h.) TTE 06/17/20: EF 40%,  mod glob HK, triv MR; I:) TTE 06/14/22: EF 25-30%, G1DD   History of cardiac catheterization    a.) LHC 10/21/2014: normal coronaries; b.) R/LHC 02/24/2017: normal coronaries, mRA 12, mPA 28, mPCWP 15, CO 8.0, CI 3.8   History of kidney stones    Hx MRSA infection    Hypertension    Hypokalemia    Hypothyroidism    Iron deficiency anemia 03/27/2018   Lump or mass in breast 11/27/2012   a.) Bx 11/27/2012 --> apocrine wall cyst   Migraine    Nausea & vomiting    Neuropathy    Non-ischemic cardiomyopathy (HCC)    a.) TTE  12/09/2016: EF 25-30%, b.) TTE 02/08/2017: EF 25%; c.) Medtronic AICD placed 03/29/2017; d.) TTE 04/25/2017: EF 30-35%; e.) TTE 01/30/2018: EF 45%; f.) TTE 11/06/2018: EF 25%; g.) TTE 05/12/2019: EF 35%; h.) TTE 11/05/2019: EF 20%; I.) TTE 06/17/2020: EF 40%; J.) TTE 06/14/2022: EF 25-30%   Obesity    OSA on CPAP    PAF (paroxysmal atrial fibrillation) (HCC)    Pneumonia    CAP   PONV (postoperative nausea and vomiting)    Post-COVID chronic cough    Postcholecystectomy diarrhea    Prolonged Q-T interval on ECG    RAD (reactive airway disease)    Restless leg    a.) on ropinirole   Seizure (HCC)    Sepsis (HCC)    Unstable angina (HCC)     Past Surgical History:  Procedure Laterality Date   ABDOMINAL HYSTERECTOMY     age 63   BREAST BIOPSY Right 2014   benign   BREAST BIOPSY Right 01/18/2023   [place] Core Bx, coil clip - path pending   BREAST BIOPSY Right 01/18/2023   [place] RT BREAST BX W LOC DEV 1ST LESION IMG BX SPEC US GUIDE 01/18/2023 [facility]   CARDIAC DEFIBRILLATOR PLACEMENT Left 03/29/2017   Procedure: MEDTRONIC CARDIAC DEFIBRILLATOR PLACEMENT (AICD); Location: [facility]; Surgeon: Bonne Dolores, MD   CESAREAN SECTION     CHOLECYSTECTOMY     COLECTOMY     COLONOSCOPY WITH PROPOFOL N/A 02/22/2018   Procedure: COLONOSCOPY WITH PROPOFOL;  Surgeon: Toledo, Boykin Nearing, MD;  Location: [facility];  Service: Gastroenterology;  Laterality: N/A;   ESOPHAGOGASTRODUODENOSCOPY (EGD) WITH PROPOFOL N/A 02/22/2018   Procedure: ESOPHAGOGASTRODUODENOSCOPY (EGD) WITH PROPOFOL;  Surgeon: Toledo, Boykin Nearing, MD;  Location: [facility];  Service: Gastroenterology;  Laterality: N/A;   IVC FILTER INSERTION N/A 08/23/2022   Procedure: IVC FILTER INSERTION;  Surgeon: Renford Dills, MD;  Location: [facility];  Service: Cardiovascular;  Laterality: N/A;   IVC FILTER REMOVAL N/A 03/07/2023   Procedure: IVC FILTER REMOVAL;  Surgeon: Renford Dills, MD;  Location: [facility];   Service: Cardiovascular;  Laterality: N/A;   KNEE ARTHROPLASTY Left 08/24/2022   Procedure: COMPUTER ASSISTED TOTAL KNEE ARTHROPLASTY;  Surgeon: Donato Heinz, MD;  Location: [facility];  Service: Orthopedics;  Laterality: Left;   LEFT HEART CATH AND CORONARY ANGIOGRAPHY Left 10/21/2014   Procedure: LEFT HEART CATH AND CORONARY ANGIOGRAPHY; Location: [facility]; Surgeon: Arnoldo Hooker, MD   NASAL SINUS SURGERY  2012   OOPHORECTOMY     RIGHT/LEFT HEART CATH AND CORONARY ANGIOGRAPHY Bilateral 02/24/2017   Procedure: RIGHT/LEFT HEART CATH AND CORONARY ANGIOGRAPHY; Location: [facility]   THYROIDECTOMY      Family History: Family History  Problem Relation Age of Onset   Breast cancer Mother    Cancer Mother 37       ovarian  CAD Mother    Cancer Father 18       brain   CAD Father    Cancer Daughter 84       skin   Breast cancer Maternal Aunt    Cancer Maternal Aunt 54       breast   Leukemia Paternal Grandfather    Breast cancer Cousin     Social History  reports that she has never smoked. She has never used smokeless tobacco. She reports that she does not drink alcohol and does not use drugs.  Allergies  Allergen Reactions   Contrast Media [Iodinated Contrast Media] Shortness Of Breath    Per CT report in 2014 pt had break through contrast reaction with hives and SOB following 13 hour prep. MSY    Ferumoxytol Nausea Only   Iron Sucrose Anaphylaxis   Lidocaine Hives   Metrizamide Shortness Of Breath   Penicillins Hives and Other (See Comments)    IgE = 4 (WNL) on 08/12/2022  ediate rash, facial/tongue/throat swelling, SOB or lightheadedness with hypotension, tachy   Sacubitril-Valsartan     Sz (unclear if new start entresto vs hypoglycemia)   Isosorbide Nitrate Other (See Comments)    Headache   Latex Hives    IgE < 0.10 (WNL) on 08/21/2023   Ondansetron Other (See Comments)    Severe headache   Tizanidine Other (See Comments)    Feels altered   Povidone-Iodine Rash     Blistering rash   Pulmicort [Budesonide] Itching    Medications   Current Facility-Administered Medications:    acetaminophen (TYLENOL) tablet 650 mg, 650 mg, Oral, Q6H PRN, Delfino Lovett, MD, 650 mg at 09/22/23 1550   albuterol (PROVENTIL) (2.5 MG/3ML) 0.083% nebulizer solution 2.5 mg, 2.5 mg, Nebulization, Q2H PRN, Floydene Flock, MD   cefTRIAXone (ROCEPHIN) 1 g in sodium chloride 0.9 % 100 mL IVPB, 1 g, Intravenous, Q24H, Floydene Flock, MD, Stopped at 09/22/23 1647   Chlorhexidine Gluconate Cloth 2 % PADS 6 each, 6 each, Topical, QHS, Manuela Schwartz, NP   enoxaparin (LOVENOX) injection 45 mg, 0.5 mg/kg, Subcutaneous, Q24H, Foye Deer, RPH, 45 mg at 09/21/23 2139   insulin aspart (novoLOG) injection 0-15 Units, 0-15 Units, Subcutaneous, Q4H, Manuela Schwartz, NP   ipratropium-albuterol (DUONEB) 0.5-2.5 (3) MG/3ML nebulizer solution 3 mL, 3 mL, Nebulization, Q6H, Floydene Flock, MD, 3 mL at 09/22/23 1501   [COMPLETED] lacosamide (VIMPAT) 200 mg in sodium chloride 0.9 % 25 mL IVPB, 200 mg, Intravenous, Once, Stopped at 09/22/23 1324 **FOLLOWED BY** lacosamide (VIMPAT) 200 mg in sodium chloride 0.9 % 25 mL IVPB, 200 mg, Intravenous, Q12H, Jefferson Fuel, MD   lacosamide (VIMPAT) tablet 150 mg, 150 mg, Oral, BID, Floydene Flock, MD, 150 mg at 09/22/23 0933   levETIRAcetam (KEPPRA) 2,500 mg in sodium chloride 0.9 % 250 mL IVPB, 2,500 mg, Intravenous, Once **FOLLOWED BY** [START ON 09/23/2023] levETIRAcetam (KEPPRA) IVPB 1000 mg/100 mL premix, 1,000 mg, Intravenous, Q12H, Jefferson Fuel, MD   levothyroxine (SYNTHROID) tablet 175 mcg, 175 mcg, Oral, q morning, Floydene Flock, MD, 175 mcg at 09/22/23 0934   losartan (COZAAR) tablet 12.5 mg, 12.5 mg, Oral, Daily, Floydene Flock, MD, 12.5 mg at 09/22/23 3244   methadone (DOLOPHINE) tablet 10 mg, 10 mg, Oral, QHS, Foye Deer, RPH, 10 mg at 09/21/23 2137   methadone (DOLOPHINE) tablet 7.5 mg, 7.5 mg, Oral, BID, Foye Deer, RPH,  7.5 mg at 09/22/23 1651   oseltamivir (TAMIFLU) capsule  30 mg, 30 mg, Oral, BID, Effie Shy, RPH, 30 mg at 09/22/23 1224   oxyCODONE-acetaminophen (PERCOCET) 7.5-325 MG per tablet 1 tablet, 1 tablet, Oral, Q4H PRN, Delfino Lovett, MD, 1 tablet at 09/22/23 0746   [COMPLETED] methylPREDNISolone sodium succinate (SOLU-MEDROL) 40 mg/mL injection 40 mg, 40 mg, Intravenous, Q12H, 40 mg at 09/22/23 0933 **FOLLOWED BY** [START ON 09/23/2023] predniSONE (DELTASONE) tablet 40 mg, 40 mg, Oral, Q breakfast, Floydene Flock, MD   promethazine (PHENERGAN) tablet 25 mg, 25 mg, Oral, Q8H PRN, Delfino Lovett, MD, 25 mg at 09/22/23 0934   rOPINIRole (REQUIP) tablet 4 mg, 4 mg, Oral, QHS, Floydene Flock, MD, 4 mg at 09/21/23 2137   spironolactone (ALDACTONE) tablet 50 mg, 50 mg, Oral, BID, Floydene Flock, MD, 50 mg at 09/22/23 0933   valproate (DEPACON) 1,800 mg in dextrose 5 % 50 mL IVPB, 1,800 mg, Intravenous, Once **FOLLOWED BY** [START ON 09/23/2023] valproate (DEPACON) 350 mg in dextrose 5 % 50 mL IVPB, 350 mg, Intravenous, Q6H, Jefferson Fuel, MD  Vitals   Vitals:   09/22/23 1814 09/22/23 1822 09/22/23 1837 09/22/23 1910  BP: 117/83 106/70    Pulse: 85 90    Resp: 12 14    Temp:  98.4 F (36.9 C)  98.7 F (37.1 C)  TempSrc:  Oral  Oral  SpO2: 95% 94% 93%   Weight:      Height:        Body mass index is 33.28 kg/m.  Physical Exam   Gen: patient lying in bed, NAD CV: extremities appear well-perfused Resp: normal WOB  Neurologic Examination   Neurologic exam MS: slightly drowsy, able to follow commands with prompting, speaks slowly and sparsely but does not seem to have true expressive aphasia. Moderate dysarthria CN: PERRL, bliniks to threat bilat, EOMI, sensation intact, face symmetric, hearing intact to voice Motor: anti-gravity in all extremities to command, equal grip strength bilaterally; formal strength exam limited 2/2 confusion Sensory: SILT Reflexes: 1+ symm with toes down  bilat Coordination: UTA 2/2 AMS Gait: deferred   Labs/Imaging/Neurodiagnostic studies   CBC:  Recent Labs  Lab 16-Oct-2023 1143 09/21/23 0954 09/22/23 0200  WBC 5.6 9.6 7.2  NEUTROABS 3.1  --   --   HGB 11.4* 12.1 10.5*  HCT 35.9* 37.5 32.5*  MCV 90.9 90.4 88.1  PLT 171 206 172   Basic Metabolic Panel:  Lab Results  Component Value Date   NA 135 09/22/2023   K 5.1 09/22/2023   CO2 25 09/22/2023   GLUCOSE 276 (H) 09/22/2023   BUN 18 09/22/2023   CREATININE 1.38 (H) 09/22/2023   CALCIUM 7.4 (L) 09/22/2023   GFRNONAA 45 (L) 09/22/2023   GFRAA >60 04/28/2020   Lipid Panel:  Lab Results  Component Value Date   LDLCALC 142 (H) 04/25/2017   HgbA1c:  Lab Results  Component Value Date   HGBA1C 6.4 (H) 08/21/2023   Urine Drug Screen:     Component Value Date/Time   LABOPIA POSITIVE (A) 15-Oct-2022 1258   COCAINSCRNUR NONE DETECTED 10/15/22 1258   LABBENZ POSITIVE (A) 2022-10-15 1258   AMPHETMU NONE DETECTED 10/15/2022 1258   THCU NONE DETECTED 2022-10-15 1258   LABBARB NONE DETECTED 2022-10-15 1258    Alcohol Level     Component Value Date/Time   ETH <10 11/30/2022 0647   INR  Lab Results  Component Value Date   INR 1.1 09/11/2022   APTT  Lab Results  Component Value Date   APTT  27 09/11/2022   AED levels:  Lab Results  Component Value Date   LEVETIRACETA <2.0 (L) 11/07/2021    CT Head without contrast(Personally reviewed): Unremarkable   rEEG:  Diffuse slowing 6-7 Hz, nothing epileptiform, no spells captured  ASSESSMENT   This is a 58 year old woman with past medical history significant for QTc problem deviation, hypertension, type 2 diabetes, COPD/asthma, CAD, CHF with EF 45% status post AICD placement, hypothyroidism, depression, DVT status post IVC filter, carcinoid tumor of the stomach, chronic pain presenting with COPD exacerbation in the setting of influenza A.  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  As an outpatient she is on lacosamide 150 mg twice daily.    In the 24 hrs since admission she has had 3 focal seizures consisting of AMS, expressive aphasia, and tremulousness with post-ictal lethargy and confusion. She can tell when the episodes are coming on although cannot specify how she knows that. She had urinary incontinence with at least one of the seizures today.   rEEG performed this afternoon showed mild diffuse swelling with nothing epileptiform although no spells were captured.  Given her longstanding history of epilepsy now with multiple breakthrough seizures in the past 24 hours despite repeated escalation of AED therapy, she requires transfer to a facility capable of cEEG with video. There has been some question in the past about whether some of her spells are psychogenic however this was speculation not based on evidence (EMU or other). Transfer for cEEG would thus provide the additional benefit of spell characterization. 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 However her seizures today are focal seizures with impaired awareness which suggests she may have suffered a brain injury (acute stroke, other) that has now created a specific seizure focus.  RECOMMENDATIONS   - S/p 2g load keppra, will give additional 2.5g load to equal 60mg /kg status epilepticus loading dose - Continue keppra 1g q 12 hrs after that (max dose given her renal function) - Continue vimpat 200mg  bid (increased this AM) - Repeat head CT (unable to get [facility] at [facility] 2/2 AICD) - Load depakote 20mg /kg to be f/b 350mg  q 6 hrs IV - [employer] monitoring overnight - If she is continues having seizures at this rate overnight, if any generalize to true tonic-clonic, if she is not protecting her airway, or becomes otherwise unstable she will need to be intubated and sedated with propofol  and/or versed.  - Transfer request pending to [facility]. There are no beds. If there is still no room tomorrow consider transferring to outside centers capable of cEEG - Sz precautions - Will continue to follow  ______________________________________________________________________    Signed, Jefferson Fuel, MD [facility]

## 2023-09-22 NOTE — Progress Notes (Signed)
Rapid Response Event Note   Reason for Call : Seizure.  Per Brittany Nguyen patient's seizure lasted 4 minutes.    Initial Focused Assessment: Patient lying on right side following simple commands given by Dr. Selina Cooley.        Interventions:  Orders placed for loading dose of Keppra and increase vimpat dose. EEG ordered as well.  [name] with known history of seizures.    Plan of Care: Patient to remain in ED 36. Per MD no need for higher level of care at this time.     Event Summary: Patient remained in room  MD Notified: :1140 Call Time:11:40 Arrival Time:11:45 End Time:11:56  Kelby Fam, RN

## 2023-09-22 NOTE — Progress Notes (Signed)
   09/22/23 1130  Spiritual Encounters  Type of Visit Initial  Care provided to: Patient  Conversation partners present during encounter Nurse  Referral source Code page (Rapid Response to [employer])  Reason for visit Code (Rapid Response to [employer])   Chaplain responded to Rapid Response.  When Chaplain arrived, there were no family members present and medical staff was working with the patient.  Patient was alert when the Chaplain departed.  Chaplain Services available for emotional and spiritual care, as needed/requested.    Rev. Rana M. Earlene Plater, MDiv. Chaplain Resident  [facility]

## 2023-09-23 ENCOUNTER — Inpatient Hospital Stay (HOSPITAL_COMMUNITY): Payer: Medicare HMO

## 2023-09-23 ENCOUNTER — Inpatient Hospital Stay (HOSPITAL_COMMUNITY)
Admission: EM | Admit: 2023-09-23 | Discharge: 2023-09-27 | DRG: 101 | Disposition: A | Discharge status: Home / Self Care | Payer: Medicare HMO | Source: Other Acute Inpatient Hospital | Attending: Internal Medicine | Admitting: Internal Medicine

## 2023-09-23 DIAGNOSIS — G40909 Epilepsy, unspecified, not intractable, without status epilepticus: Secondary | ICD-10-CM | POA: Diagnosis not present

## 2023-09-23 DIAGNOSIS — Z8041 Family history of malignant neoplasm of ovary: Secondary | ICD-10-CM

## 2023-09-23 DIAGNOSIS — G4733 Obstructive sleep apnea (adult) (pediatric): Secondary | ICD-10-CM

## 2023-09-23 DIAGNOSIS — J449 Chronic obstructive pulmonary disease, unspecified: Secondary | ICD-10-CM | POA: Diagnosis not present

## 2023-09-23 DIAGNOSIS — Z8249 Family history of ischemic heart disease and other diseases of the circulatory system: Secondary | ICD-10-CM

## 2023-09-23 DIAGNOSIS — J101 Influenza due to other identified influenza virus with other respiratory manifestations: Secondary | ICD-10-CM | POA: Diagnosis present

## 2023-09-23 DIAGNOSIS — Z87442 Personal history of urinary calculi: Secondary | ICD-10-CM

## 2023-09-23 DIAGNOSIS — E890 Postprocedural hypothyroidism: Secondary | ICD-10-CM | POA: Diagnosis present

## 2023-09-23 DIAGNOSIS — G9349 Other encephalopathy: Secondary | ICD-10-CM | POA: Diagnosis not present

## 2023-09-23 DIAGNOSIS — I480 Paroxysmal atrial fibrillation: Secondary | ICD-10-CM | POA: Diagnosis not present

## 2023-09-23 DIAGNOSIS — R471 Dysarthria and anarthria: Secondary | ICD-10-CM | POA: Diagnosis present

## 2023-09-23 DIAGNOSIS — F418 Other specified anxiety disorders: Secondary | ICD-10-CM | POA: Diagnosis present

## 2023-09-23 DIAGNOSIS — J1081 Influenza due to other identified influenza virus with encephalopathy: Secondary | ICD-10-CM | POA: Diagnosis present

## 2023-09-23 DIAGNOSIS — R569 Unspecified convulsions: Secondary | ICD-10-CM | POA: Diagnosis not present

## 2023-09-23 DIAGNOSIS — E039 Hypothyroidism, unspecified: Secondary | ICD-10-CM | POA: Diagnosis not present

## 2023-09-23 DIAGNOSIS — G40409 Other generalized epilepsy and epileptic syndromes, not intractable, without status epilepticus: Principal | ICD-10-CM | POA: Diagnosis present

## 2023-09-23 DIAGNOSIS — F32A Depression, unspecified: Secondary | ICD-10-CM | POA: Diagnosis present

## 2023-09-23 DIAGNOSIS — Z79899 Other long term (current) drug therapy: Secondary | ICD-10-CM

## 2023-09-23 DIAGNOSIS — Z806 Family history of leukemia: Secondary | ICD-10-CM

## 2023-09-23 DIAGNOSIS — Z6836 Body mass index (BMI) 36.0-36.9, adult: Secondary | ICD-10-CM

## 2023-09-23 DIAGNOSIS — G9340 Encephalopathy, unspecified: Secondary | ICD-10-CM | POA: Diagnosis present

## 2023-09-23 DIAGNOSIS — I2510 Atherosclerotic heart disease of native coronary artery without angina pectoris: Secondary | ICD-10-CM | POA: Diagnosis present

## 2023-09-23 DIAGNOSIS — G894 Chronic pain syndrome: Secondary | ICD-10-CM | POA: Diagnosis present

## 2023-09-23 DIAGNOSIS — Z95810 Presence of automatic (implantable) cardiac defibrillator: Secondary | ICD-10-CM

## 2023-09-23 DIAGNOSIS — Z808 Family history of malignant neoplasm of other organs or systems: Secondary | ICD-10-CM

## 2023-09-23 DIAGNOSIS — J441 Chronic obstructive pulmonary disease with (acute) exacerbation: Secondary | ICD-10-CM | POA: Diagnosis present

## 2023-09-23 DIAGNOSIS — Z8614 Personal history of Methicillin resistant Staphylococcus aureus infection: Secondary | ICD-10-CM

## 2023-09-23 DIAGNOSIS — R32 Unspecified urinary incontinence: Secondary | ICD-10-CM | POA: Diagnosis present

## 2023-09-23 DIAGNOSIS — I110 Hypertensive heart disease with heart failure: Secondary | ICD-10-CM | POA: Diagnosis not present

## 2023-09-23 DIAGNOSIS — R4701 Aphasia: Secondary | ICD-10-CM | POA: Diagnosis not present

## 2023-09-23 DIAGNOSIS — G2581 Restless legs syndrome: Secondary | ICD-10-CM | POA: Diagnosis present

## 2023-09-23 DIAGNOSIS — Z95828 Presence of other vascular implants and grafts: Secondary | ICD-10-CM | POA: Diagnosis not present

## 2023-09-23 DIAGNOSIS — Z96652 Presence of left artificial knee joint: Secondary | ICD-10-CM | POA: Diagnosis present

## 2023-09-23 DIAGNOSIS — I252 Old myocardial infarction: Secondary | ICD-10-CM

## 2023-09-23 DIAGNOSIS — I428 Other cardiomyopathies: Secondary | ICD-10-CM | POA: Diagnosis present

## 2023-09-23 DIAGNOSIS — E119 Type 2 diabetes mellitus without complications: Secondary | ICD-10-CM

## 2023-09-23 DIAGNOSIS — E669 Obesity, unspecified: Secondary | ICD-10-CM | POA: Diagnosis present

## 2023-09-23 DIAGNOSIS — R54 Age-related physical debility: Secondary | ICD-10-CM | POA: Diagnosis present

## 2023-09-23 DIAGNOSIS — I5022 Chronic systolic (congestive) heart failure: Secondary | ICD-10-CM | POA: Diagnosis present

## 2023-09-23 DIAGNOSIS — I10 Essential (primary) hypertension: Secondary | ICD-10-CM | POA: Diagnosis not present

## 2023-09-23 DIAGNOSIS — Z803 Family history of malignant neoplasm of breast: Secondary | ICD-10-CM

## 2023-09-23 DIAGNOSIS — Z853 Personal history of malignant neoplasm of breast: Secondary | ICD-10-CM

## 2023-09-23 DIAGNOSIS — Z90710 Acquired absence of both cervix and uterus: Secondary | ICD-10-CM

## 2023-09-23 DIAGNOSIS — J1181 Influenza due to unidentified influenza virus with encephalopathy: Secondary | ICD-10-CM

## 2023-09-23 DIAGNOSIS — Z79890 Hormone replacement therapy (postmenopausal): Secondary | ICD-10-CM

## 2023-09-23 DIAGNOSIS — I5042 Chronic combined systolic (congestive) and diastolic (congestive) heart failure: Secondary | ICD-10-CM | POA: Diagnosis not present

## 2023-09-23 DIAGNOSIS — Z79891 Long term (current) use of opiate analgesic: Secondary | ICD-10-CM

## 2023-09-23 DIAGNOSIS — D649 Anemia, unspecified: Secondary | ICD-10-CM | POA: Diagnosis not present

## 2023-09-23 DIAGNOSIS — J329 Chronic sinusitis, unspecified: Secondary | ICD-10-CM | POA: Diagnosis not present

## 2023-09-23 LAB — GLUCOSE, CAPILLARY
Glucose-Capillary: 126 mg/dL — ABNORMAL HIGH (ref 70–99)
Glucose-Capillary: 112 mg/dL — ABNORMAL HIGH (ref 70–99)
Glucose-Capillary: 148 mg/dL — ABNORMAL HIGH (ref 70–99)
Glucose-Capillary: 227 mg/dL — ABNORMAL HIGH (ref 70–99)
Glucose-Capillary: 167 mg/dL — ABNORMAL HIGH (ref 70–99)
Glucose-Capillary: 166 mg/dL — ABNORMAL HIGH (ref 70–99)

## 2023-09-23 LAB — BASIC METABOLIC PANEL
Anion gap: 9 (ref 5–15)
BUN: 12 mg/dL (ref 6–20)
CO2: 30 mmol/L (ref 22–32)
Calcium: 7.8 mg/dL — ABNORMAL LOW (ref 8.9–10.3)
Chloride: 102 mmol/L (ref 98–111)
Creatinine, Ser: 0.92 mg/dL (ref 0.44–1.00)
GFR, Estimated: >60 mL/min (ref >60)
Glucose, Bld: 130 mg/dL — ABNORMAL HIGH (ref 70–99)
Potassium: 4.4 mmol/L (ref 3.5–5.1)
Sodium: 141 mmol/L (ref 135–145)

## 2023-09-23 LAB — MAGNESIUM: Magnesium: 2.7 mg/dL — ABNORMAL HIGH (ref 1.7–2.4)

## 2023-09-23 LAB — CBC
HCT: 31.1 % — ABNORMAL LOW (ref 36.0–46.0)
Hemoglobin: 10.3 g/dL — ABNORMAL LOW (ref 12.0–15.0)
MCH: 29.1 pg (ref 26.0–34.0)
MCHC: 33.1 g/dL (ref 30.0–36.0)
MCV: 87.9 fL (ref 80.0–100.0)
Platelets: 177 10*3/uL (ref 150–400)
RBC: 3.54 MIL/uL — ABNORMAL LOW (ref 3.87–5.11)
RDW: 14.9 % (ref 11.5–15.5)
WBC: 8.1 10*3/uL (ref 4.0–10.5)
nRBC: 0 % (ref 0.0–0.2)

## 2023-09-23 LAB — TSH: TSH: 21.828 u[IU]/mL — ABNORMAL HIGH (ref 0.350–4.500)

## 2023-09-23 MED ORDER — LEVETIRACETAM IN NACL 1000 MG/100ML IV SOLN: 1000.0000 mg | Freq: Two times a day (BID) | INTRAVENOUS | Status: DC

## 2023-09-23 MED ORDER — OSELTAMIVIR PHOSPHATE 75 MG PO CAPS
75.0000 mg | ORAL_CAPSULE | Freq: Two times a day (BID) | ORAL | Status: AC
  Administered 2023-09-23: 75 mg via ORAL
  Filled 2023-09-23: qty 1

## 2023-09-23 MED ORDER — PREDNISONE 20 MG PO TABS
40.0000 mg | ORAL_TABLET | Freq: Every day | ORAL | Status: DC
  Administered 2023-09-24 – 2023-09-27 (×4): 40 mg via ORAL
  Filled 2023-09-23 (×4): qty 2

## 2023-09-23 MED ORDER — ALBUTEROL SULFATE (2.5 MG/3ML) 0.083% IN NEBU
2.5000 mg | INHALATION_SOLUTION | Freq: Four times a day (QID) | RESPIRATORY_TRACT | Status: DC | PRN
Start: 2023-09-23 — End: 2023-09-27
  Administered 2023-09-24 – 2023-09-25 (×2): 2.5 mg via RESPIRATORY_TRACT
  Filled 2023-09-23 (×2): qty 3

## 2023-09-23 MED ORDER — LEVOTHYROXINE SODIUM 75 MCG PO TABS
175.0000 ug | ORAL_TABLET | Freq: Every day | ORAL | Status: DC
  Administered 2023-09-24 – 2023-09-27 (×4): 175 ug via ORAL
  Filled 2023-09-23 (×4): qty 1

## 2023-09-23 MED ORDER — ROPINIROLE HCL 1 MG PO TABS
4.0000 mg | ORAL_TABLET | Freq: Every day | ORAL | Status: DC
  Administered 2023-09-23 – 2023-09-26 (×4): 4 mg via ORAL
  Filled 2023-09-23 (×4): qty 4

## 2023-09-23 MED ORDER — CALCIUM GLUCONATE-NACL 2-0.675 GM/100ML-% IV SOLN
2.0000 g | Freq: Once | INTRAVENOUS | Status: AC
  Administered 2023-09-24: 2000 mg via INTRAVENOUS
  Filled 2023-09-23 (×2): qty 100

## 2023-09-23 MED ORDER — LEVETIRACETAM IN NACL 1000 MG/100ML IV SOLN
1000.0000 mg | Freq: Two times a day (BID) | INTRAVENOUS | Status: DC
  Administered 2023-09-23 – 2023-09-25 (×4): 1000 mg via INTRAVENOUS
  Filled 2023-09-23 (×6): qty 100

## 2023-09-23 MED ORDER — METHADONE HCL 5 MG PO TABS: 7.5000 mg | ORAL_TABLET | Freq: Two times a day (BID) | ORAL | Status: DC

## 2023-09-23 MED ORDER — OSELTAMIVIR PHOSPHATE 75 MG PO CAPS
75.0000 mg | ORAL_CAPSULE | Freq: Two times a day (BID) | ORAL | Status: DC
  Administered 2023-09-23 – 2023-09-24 (×2): 75 mg via ORAL
  Filled 2023-09-23 (×3): qty 1

## 2023-09-23 MED ORDER — METHADONE HCL 10 MG PO TABS
10.0000 mg | ORAL_TABLET | Freq: Every day | ORAL | Status: DC
  Administered 2023-09-24: 10 mg via ORAL
  Filled 2023-09-23: qty 1

## 2023-09-23 MED ORDER — INSULIN ASPART 100 UNIT/ML IJ SOLN
0.0000 [IU] | Freq: Three times a day (TID) | INTRAMUSCULAR | Status: DC
  Administered 2023-09-24 (×3): 1 [IU] via SUBCUTANEOUS
  Administered 2023-09-25: 2 [IU] via SUBCUTANEOUS
  Administered 2023-09-25: 1 [IU] via SUBCUTANEOUS
  Administered 2023-09-26: 3 [IU] via SUBCUTANEOUS
  Administered 2023-09-26: 2 [IU] via SUBCUTANEOUS
  Administered 2023-09-27: 1 [IU] via SUBCUTANEOUS

## 2023-09-23 MED ORDER — ACETAMINOPHEN 650 MG RE SUPP: 650.0000 mg | Freq: Four times a day (QID) | RECTAL | Status: DC | PRN

## 2023-09-23 MED ORDER — SODIUM CHLORIDE 0.9 % IV SOLN: 200.0000 mg | Freq: Two times a day (BID) | INTRAVENOUS | Status: DC

## 2023-09-23 MED ORDER — ACETAMINOPHEN 325 MG PO TABS
650.0000 mg | ORAL_TABLET | Freq: Four times a day (QID) | ORAL | Status: DC | PRN
  Administered 2023-09-24: 650 mg via ORAL
  Filled 2023-09-23: qty 2

## 2023-09-23 MED ORDER — METHADONE HCL 10 MG PO TABS: 10.0000 mg | ORAL_TABLET | Freq: Every day | ORAL | Status: DC

## 2023-09-23 MED ORDER — ESCITALOPRAM OXALATE 10 MG PO TABS
10.0000 mg | ORAL_TABLET | Freq: Every day | ORAL | Status: DC
  Administered 2023-09-24 – 2023-09-27 (×4): 10 mg via ORAL
  Filled 2023-09-23 (×4): qty 1

## 2023-09-23 MED ORDER — SODIUM CHLORIDE 0.9 % IV SOLN
200.0000 mg | Freq: Two times a day (BID) | INTRAVENOUS | Status: DC
  Administered 2023-09-23 – 2023-09-25 (×5): 200 mg via INTRAVENOUS
  Filled 2023-09-23 (×7): qty 20

## 2023-09-23 MED ORDER — METHADONE HCL 5 MG PO TABS
7.5000 mg | ORAL_TABLET | Freq: Two times a day (BID) | ORAL | Status: DC
  Administered 2023-09-24 – 2023-09-25 (×3): 7.5 mg via ORAL
  Filled 2023-09-23 (×3): qty 2

## 2023-09-23 MED ORDER — GABAPENTIN 300 MG PO CAPS
300.0000 mg | ORAL_CAPSULE | Freq: Three times a day (TID) | ORAL | Status: DC
  Administered 2023-09-23 – 2023-09-25 (×5): 300 mg via ORAL
  Filled 2023-09-23 (×5): qty 1

## 2023-09-23 MED ORDER — INSULIN ASPART 100 UNIT/ML IJ SOLN
0.0000 [IU] | Freq: Three times a day (TID) | INTRAMUSCULAR | Status: DC
  Administered 2023-09-23: 2 [IU] via SUBCUTANEOUS

## 2023-09-23 MED ORDER — GUAIFENESIN-CODEINE 100-10 MG/5ML PO SOLN
5.0000 mL | Freq: Four times a day (QID) | ORAL | Status: DC | PRN
  Administered 2023-09-24 – 2023-09-26 (×6): 5 mL via ORAL
  Filled 2023-09-23 (×6): qty 5

## 2023-09-23 MED ORDER — SODIUM CHLORIDE 0.9% FLUSH
3.0000 mL | Freq: Two times a day (BID) | INTRAVENOUS | Status: DC
  Administered 2023-09-23 – 2023-09-27 (×7): 3 mL via INTRAVENOUS

## 2023-09-23 MED ORDER — INSULIN ASPART 100 UNIT/ML IJ SOLN: 0.0000 [IU] | Freq: Every day | INTRAMUSCULAR | Status: DC

## 2023-09-23 MED ORDER — TRIMETHOBENZAMIDE HCL 100 MG/ML IM SOLN: 200.0000 mg | Freq: Four times a day (QID) | INTRAMUSCULAR | Status: DC | PRN

## 2023-09-23 MED ORDER — LORAZEPAM 2 MG/ML IJ SOLN: 1.0000 mg | INTRAMUSCULAR | Status: DC | PRN

## 2023-09-23 MED ORDER — ENOXAPARIN SODIUM 40 MG/0.4ML IJ SOSY
40.0000 mg | PREFILLED_SYRINGE | INTRAMUSCULAR | Status: DC
  Administered 2023-09-23 – 2023-09-26 (×4): 40 mg via SUBCUTANEOUS
  Filled 2023-09-23 (×4): qty 0.4

## 2023-09-23 NOTE — Progress Notes (Signed)
Patient arrived at 1640 via ambulance while unresponsive. Patient not responsive to sternal rubs from staff and EMT. Per EMT patient was verbal on route, and went unconscious just as they got to the hospital.  Patient stayed  unconscious for 25 minutes from 1640 to 1705. Patient is more responsive at this time, still drowsy but able to follow command from RR RN. Attending MD at bedside. Elnita Maxwell, RN

## 2023-09-23 NOTE — Progress Notes (Signed)
1555- [facility] arrived to transport patient to [facility]. Pre transport vitals charted and all paper work completed and given to [employer]. Husband updated and at bedside.

## 2023-09-23 NOTE — Progress Notes (Signed)
LTM EEG running - no initial skin breakdown - push button tested - neuro notified. HU charge captured  [facility] NOTIFIED

## 2023-09-23 NOTE — Progress Notes (Signed)
Pt beig transferred. Per RN, pt pulled all wires off head. Will reapply when pt gets to new room

## 2023-09-23 NOTE — Progress Notes (Signed)
LTM EEG hooked up and running - no initial skin breakdown - push button tested - [facility] monitoring.  

## 2023-09-23 NOTE — Progress Notes (Signed)
NEUROLOGY CONSULT FOLLOW UP NOTE   Date of service: September 23, 2023 Patient Name: Brittany Nguyen MRN:  [id number] DOB:  [birth year]  Interval Hx/subjective   Repeat head CT showed no acute findings. No seizure-like episodes since 1800 yesterday when she was loaded with Depakote as a third agent.  She is lethargic  but appropriate and does not have any new neurologic complaints at this time.  Vitals   Vitals:   09/23/23 1654 09/23/23 1710  BP: 121/87 128/82  Pulse: 83 79  Resp: 19 14  SpO2: 93% 94%     There is no height or weight on file to calculate BMI.  Physical Exam   Gen: patient lying in bed, NAD CV: extremities appear well-perfused Resp: normal WOB   Neurologic Examination    Neurologic exam MS: slightly drowsy, able to follow commands with prompting, speaks slowly and sparsely but does not seem to have true expressive aphasia. Moderate dysarthria CN: PERRL, bliniks to threat bilat, EOMI, sensation intact, face symmetric, hearing intact to voice Motor: anti-gravity in all extremities to command, equal grip strength bilaterally; formal strength exam limited 2/2 confusion Sensory: SILT Reflexes: 1+ symm with toes down bilat Coordination: UTA 2/2 AMS Gait: deferred   Medications  Current Facility-Administered Medications:    acetaminophen (TYLENOL) tablet 650 mg, 650 mg, Oral, Q6H PRN **OR** acetaminophen (TYLENOL) suppository 650 mg, 650 mg, Rectal, Q6H PRN, Smith, Rondell A, MD   albuterol (PROVENTIL) (2.5 MG/3ML) 0.083% nebulizer solution 2.5 mg, 2.5 mg, Nebulization, Q6H PRN, Katrinka Blazing, Rondell A, MD   enoxaparin (LOVENOX) injection 40 mg, 40 mg, Subcutaneous, Q24H, Smith, Rondell A, MD, 40 mg at 09/23/23 1801   [START ON 09/24/2023] escitalopram (LEXAPRO) tablet 10 mg, 10 mg, Oral, Daily, Katrinka Blazing, Rondell A, MD   [START ON 09/24/2023] insulin aspart (novoLOG) injection 0-6 Units, 0-6 Units, Subcutaneous, TID WC, Smith, Rondell A, MD, 2 Units at 09/23/23 1756    lacosamide (VIMPAT) 200 mg in sodium chloride 0.9 % 25 mL IVPB, 200 mg, Intravenous, Q12H, Smith, Rondell A, MD   levETIRAcetam (KEPPRA) IVPB 1000 mg/100 mL premix, 1,000 mg, Intravenous, Q12H, Clydie Braun, MD   [START ON 09/24/2023] levothyroxine (SYNTHROID) tablet 175 mcg, 175 mcg, Oral, Q0600, Smith, Rondell A, MD   LORazepam (ATIVAN) injection 1-2 mg, 1-2 mg, Intravenous, Q2H PRN, Katrinka Blazing, Rondell A, MD   oseltamivir (TAMIFLU) capsule 75 mg, 75 mg, Oral, BID, Smith, Rondell A, MD   rOPINIRole (REQUIP) tablet 4 mg, 4 mg, Oral, QHS, Smith, Rondell A, MD   sodium chloride flush (NS) 0.9 % injection 3 mL, 3 mL, Intravenous, Q12H, Smith, Rondell A, MD   trimethobenzamide (TIGAN) injection 200 mg, 200 mg, Intramuscular, Q6H PRN, Clydie Braun, MD  Labs and Diagnostic Imaging   CBC:  Recent Labs  Lab 09/20/23 1143 09/21/23 0954 09/22/23 0200 09/23/23 0538  WBC 5.6   < > 7.2 8.1  NEUTROABS 3.1  --   --   --   HGB 11.4*   < > 10.5* 10.3*  HCT 35.9*   < > 32.5* 31.1*  MCV 90.9   < > 88.1 87.9  PLT 171   < > 172 177   < > = values in this interval not displayed.    Basic Metabolic Panel:  Lab Results  Component Value Date   NA 141 09/23/2023   K 4.4 09/23/2023   CO2 30 09/23/2023   GLUCOSE 130 (H) 09/23/2023   BUN 12 09/23/2023   CREATININE 0.92 09/23/2023  CALCIUM 7.8 (L) 09/23/2023   GFRNONAA >60 09/23/2023   GFRAA >60 04/28/2020   Lipid Panel:  Lab Results  Component Value Date   LDLCALC 142 (H) 04/25/2017   HgbA1c:  Lab Results  Component Value Date   HGBA1C 6.4 (H) 08/21/2023   Urine Drug Screen:     Component Value Date/Time   LABOPIA POSITIVE (A) 09/19/2022 1258   COCAINSCRNUR NONE DETECTED 09/19/2022 1258   LABBENZ POSITIVE (A) 09/19/2022 1258   AMPHETMU NONE DETECTED 09/19/2022 1258   THCU NONE DETECTED 09/19/2022 1258   LABBARB NONE DETECTED 09/19/2022 1258    Alcohol Level     Component Value Date/Time   ETH <10 11/30/2022 0647   INR  Lab  Results  Component Value Date   INR 1.1 09/11/2022   APTT  Lab Results  Component Value Date   APTT 27 09/11/2022   AED levels:  Lab Results  Component Value Date   LEVETIRACETA <2.0 (L) 11/07/2021    CT Head without contrast(Personally reviewed): Unremarkable   rEEG:  Diffuse slowing 6-7 Hz, nothing epileptiform, no spells captured  Assessment   This is a 58 year old woman with past medical history significant for QTc problem deviation, hypertension, type 2 diabetes, COPD/asthma, CAD, CHF with EF 45% status post AICD placement, hypothyroidism, depression, DVT status post IVC filter, carcinoid tumor of the stomach, chronic pain presenting with COPD exacerbation in the setting of influenza A.  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  As an outpatient she was on lacosamide 150 mg twice daily.     In the 36 hrs since admission she has had 3 focal seizures consisting of AMS, expressive aphasia, and tremulousness with post-ictal lethargy and confusion. She can tell when the episodes are coming on although cannot specify how she knows that. She had urinary incontinence with at least one of these events.   rEEG performed 1/17 showed mild diffuse swelling with nothing epileptiform although no spells were captured.  Given her longstanding history of epilepsy now with multiple breakthrough seizures in the past 24 hours despite repeated escalation of AED therapy, transfer was requested to [facility] for cEEG. They did not have any available beds but according to [facility] they are expected to have one for her there later this afternoon.  There has been some question in the past about whether some of her spells are psychogenic however this was speculation not based on evidence (EMU or other). Transfer for cEEG would thus provide the additional benefit of spell  characterization. 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 However her seizures since admission are focal seizures with impaired awareness which suggests she may have suffered a brain injury (acute stroke, other) that has now created a specific seizure focus. MRI brain was unable to be performed at [facility] bc she has an AICD, but she may be able to undergo MRI at [facility] if her device is determined to be MRI-compatible.  Recommendations   - Continue keppra 1g q 12 hrs (max dose given her renal function) - Continue vimpat 200mg  q 12 hrs - Continue depakote 350mg  q 6 hrs IV; check VPA level early next week - Patient will be transferred to [facility] this afternoon for cEEG. Please notify neurology upon her arrival and we will continue to follow in consult. - MRI brain wwo at [facility] if AICD is determined  to be MRI-compatible. - Seizure precautions - Further mgmt per neurology consult team at [facility], please call if any questions in the interim. ______________________________________________________________________   Signed, Jefferson Fuel, MD [facility]

## 2023-09-23 NOTE — Discharge Summary (Signed)
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 female with medical history significant of QTc prolongation, hypertension, type 2  diabetes, COPD/asthma, CAD, CHF with EF 45% status post AICD, hypothyroidism, GERD, depression, DVT status post IVC filter, carcinoid tumor of the stomach, chronic pain presenting COPD exacerbation, influenza A    1/17: Lactic acid continues to trend up. Neuro c/s for seizure, Palliative care c/s for GOC 1/18: Patient has had 3 episodes of seizure since admission.  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  Continue Keppra 1 g twice daily - continue Vimpat 200 mg twice daily - EEG now showing any epileptiform activity - CT head on 1/16 and 1/17- nothing acute -Monitored in [place] to [facility] - unable to get MRI due to AICD - Transfer to [facility] for continuous EEG monitoring when bed becomes available - Seizure precautions   COPD (chronic obstructive pulmonary disease) (HCC) Influenza A  Acute COPD Exacerbation in setting of influenza A (original dx 09/18/23) CXR WNL  No hypoxia at present.  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 01/2018 2D ECHO w/ EF 45% and grade 1 diastolic dysfunction    HTN (hypertension) BP soft/low    Type 2 diabetes mellitus without complication (HCC) SSI     Depression with anxiety Cont lexapro    OSA on CPAP CPAP   Acquired hypothyroidism Cont synthroid     GERD PPI       Consultants: Neurology  Disposition:  [facility] Diet recommendation:  Discharge Diet Orders (From admission, onward)     Start     Ordered   09/23/23 0000  Diet - low sodium heart healthy        09/23/23 1340           Carb modified diet DISCHARGE MEDICATION: Allergies as of 09/23/2023       Reactions   Contrast Media [iodinated  Contrast Media] Shortness Of Breath   Per CT report in 2014 pt had break through contrast reaction with hives and SOB following 13 hour prep. MSY    Ferumoxytol Nausea Only   Iron Sucrose Anaphylaxis   Lidocaine Hives   Metrizamide Shortness Of Breath   Penicillins Hives, Other (See Comments)   IgE = 4 (WNL) on 08/12/2022 ediate rash, facial/tongue/throat swelling, SOB or lightheadedness with hypotension, tachy   Sacubitril-valsartan    Sz (unclear if new start entresto vs hypoglycemia)   Isosorbide Nitrate Other (See Comments)   Headache   Latex Hives   IgE < 0.10 (WNL) on  08/21/2023   Ondansetron Other (See Comments)   Severe headache   Tizanidine Other (See Comments)   Feels altered   Povidone-iodine Rash   Blistering rash   Pulmicort [budesonide] Itching        Medication List     STOP taking these medications    beclomethasone 80 MCG/ACT inhaler Commonly known as: QVAR   Lacosamide 100 MG Tabs   losartan 25 MG tablet Commonly known as: COZAAR   metoprolol succinate 50 MG 24 hr tablet Commonly known as: TOPROL-XL   naloxone 4 MG/0.1ML Liqd nasal spray kit Commonly known as: NARCAN   nitroGLYCERIN 0.4 MG SL tablet Commonly known as: NITROSTAT   NON FORMULARY   oseltamivir 75 MG capsule Commonly known as: Tamiflu   oxyCODONE-acetaminophen 7.5-325 MG tablet Commonly known as: PERCOCET   Potassium Chloride ER 20 MEQ Tbcr   promethazine 25 MG tablet Commonly known as: PHENERGAN   spironolactone 50 MG tablet Commonly known as: ALDACTONE   torsemide 20 MG tablet Commonly known as: DEMADEX   Ubrogepant 100 MG Tabs       TAKE these medications    albuterol 108 (90 Base) MCG/ACT inhaler Commonly known as: VENTOLIN HFA Inhale 1-2 puffs into the lungs every 6 (six) hours as needed for wheezing or shortness of breath.   escitalopram 10 MG tablet Commonly known as: LEXAPRO Take 10 mg by mouth daily.   gabapentin 300 MG capsule Commonly known as: NEURONTIN Take 1 capsule (300 mg total) by mouth at bedtime. What changed: when to take this   guaiFENesin-codeine 100-10 MG/5ML syrup Take 5 mLs by mouth every 6 (six) hours as needed for cough.   lacosamide 200 mg in sodium chloride 0.9 % 25 mL Inject 200 mg into the vein every 12 (twelve) hours.   levETIRAcetam 1000 MG/100ML Soln Commonly known as: KEPPRA Inject 100 mLs (1,000 mg total) into the vein every 12 (twelve) hours.   levothyroxine 175 MCG tablet Commonly known as: SYNTHROID Take 175 mcg by mouth every morning.   LORazepam 1 MG tablet Commonly known as:  ATIVAN Take 1 tablet (1 mg total) by mouth 4 (four) times daily as needed.   methadone 10 MG tablet Commonly known as: DOLOPHINE Take 1 tablet (10 mg total) by mouth at bedtime. What changed: You were already taking a medication with the same name, and this prescription was added. Make sure you understand how and when to take each.   methadone 5 MG tablet Commonly known as: DOLOPHINE Take 1.5 tablets (7.5 mg total) by mouth 2 (two) times daily at 10 am and 4 pm. What changed:  how much to take when to take this additional instructions   rOPINIRole 4 MG tablet Commonly known as: REQUIP Take 4 mg by mouth at bedtime.        Follow-up Information     A[place],  Fayrene Fearing, MD. Schedule an appointment as soon as possible for a visit in 1 week(s).   Specialty: Family Medicine Why: [facility] Discharge F/UP Contact information: [address] [place] [address] [phone]                Discharge Exam: Ceasar Mons Weights   09/21/23 0949  Weight: 90.7 kg   General exam: She is sleepy Respiratory system: Clear to auscultation. Respiratory effort normal. Cardiovascular system: S1 & S2 heard, RRR.  No murmurs Gastrointestinal system: Abdomen is soft, benign Central nervous system: Sleepy. No focal neurological deficits. Extremities: Symmetric 5 x 5 power. Skin: No rashes, lesions or ulcers  Condition at discharge: fair  The results of significant diagnostics from this hospitalization (including imaging, microbiology, ancillary and laboratory) are listed below for reference.   Imaging Studies: CT HEAD WO CONTRAST ( ) Result Date: 09/22/2023 CLINICAL DATA:  Seizure disorder. EXAM: CT HEAD WITHOUT CONTRAST TECHNIQUE: Contiguous axial images were obtained from the base of the skull through the vertex without intravenous contrast. RADIATION DOSE REDUCTION: This exam was performed according to the departmental dose-optimization program which includes  automated exposure control, adjustment of the mA and/or kV according to patient size and/or use of iterative reconstruction technique. COMPARISON:  None Available. FINDINGS: Brain: No mass,hemorrhage or extra-axial collection. Normal appearance of the parenchyma and CSF spaces. Vascular: No hyperdense vessel or unexpected vascular calcification. Skull: The visualized skull base, calvarium and extracranial soft tissues are normal. Sinuses/Orbits: No fluid levels or advanced mucosal thickening of the visualized paranasal sinuses. No mastoid or middle ear effusion. Normal orbits. Other: None. IMPRESSION: Normal head CT. Electronically Signed   By: Deatra Robinson M.D.   On: 09/22/2023 20:48   EEG adult Result Date: 09/22/2023 Rejeana Brock, MD     09/22/2023  7:32 PM History: 58 year old female being evaluated for possible seizures Sedation: Ativan given earlier Patient State: Awake and asleep Technique: This EEG was acquired with electrodes placed according to the International 10-20 electrode system (including Fp1, Fp2, F3, F4, C3, C4, P3, P4, O1, O2, T3, T4, T5, T6, A1, A2, Fz, Cz, Pz). The following electrodes were missing or displaced: none. Background: The background is relatively well-organized though there is a slow posterior dominant rhythm with a frequency of 6 to 7 Hz.  There is also mild intrusion of irregular slow activity in the delta and theta range.  Selectivity increases with drowsiness and sleep is recorded with symmetric appearing structures.  There is no epileptiform activity seen. Photic stimulation: Physiologic driving is not performed EEG Abnormalities: 1) generalized irregular slow activity 2) slow posterior dominant rhythm Clinical Interpretation: This EEG is consistent with a mild generalized nonspecific cerebral dysfunction (encephalopathy). There was no seizure or seizure predisposition recorded on this study. Please note that lack of epileptiform activity on EEG does not preclude  the possibility of epilepsy. Ritta Slot, MD [facility] [phone] If 7pm- 7am, please page neurology on call as listed in AMION.  CT HEAD WO CONTRAST ( ) Result Date: 09/21/2023 CLINICAL DATA:  Mental status change EXAM: CT HEAD WITHOUT CONTRAST TECHNIQUE: Contiguous axial images were obtained from the base of the skull through the vertex without intravenous contrast. RADIATION DOSE REDUCTION: This exam was performed according to the departmental dose-optimization program which includes automated exposure control, adjustment of the mA and/or kV according to patient size and/or use of iterative reconstruction technique. COMPARISON:  CT brain 05/26/2023 FINDINGS: Brain: No evidence of acute infarction, hemorrhage, hydrocephalus, extra-axial collection  or mass lesion/mass effect. Vascular: No hyperdense vessels.  No unexpected calcification Skull: Normal. Negative for fracture or focal lesion. Sinuses/Orbits: No acute finding. Osteoma left frontal ethmoidal sinuses. Mucosal thickening in the maxillary and ethmoid sinuses Other: None IMPRESSION: Negative non contrasted CT appearance of the brain. Electronically Signed   By: Jasmine Pang M.D.   On: 09/21/2023 17:41   DG Chest 2 View Result Date: 09/21/2023 CLINICAL DATA:  COPD EXAM: CHEST - 2 VIEW COMPARISON:  09/20/2023 FINDINGS: Left-sided implanted cardiac device in stable positioning. The heart size and mediastinal contours are unchanged. Slightly low lung volumes. No focal airspace consolidation, pleural effusion, or pneumothorax. The visualized skeletal structures are unremarkable. IMPRESSION: No active cardiopulmonary disease. Electronically Signed   By: Duanne Guess D.O.   On: 09/21/2023 11:03   DG Chest 2 View Result Date: 09/20/2023 CLINICAL DATA:  Shortness of breath. EXAM: CHEST - 2 VIEW COMPARISON:  Chest radiograph dated June 06, 2023. FINDINGS: The cardiomediastinal silhouette is unchanged. Stable left subclavian  dual lead AICD in place. No focal consolidation, pleural effusion, or pneumothorax. No acute osseous abnormality. IMPRESSION: No acute cardiopulmonary findings. Electronically Signed   By: Hart Robinsons M.D.   On: 09/20/2023 13:45    Microbiology: Results for orders placed or performed during the hospital encounter of 09/21/23  MRSA Next Gen by PCR, Nasal     Status: None   Collection Time: 09/22/23  7:19 PM   Specimen: Nasal Mucosa; Nasal Swab  Result Value Ref Range Status   MRSA by PCR Next Gen NOT DETECTED NOT DETECTED Final    Comment: (NOTE) The GeneXpert MRSA Assay (FDA approved for NASAL specimens only), is one component of a comprehensive MRSA colonization surveillance program. It is not intended to diagnose MRSA infection nor to guide or monitor treatment for MRSA infections. Test performance is not FDA approved in patients less than 70 years old. Performed at [facility], [address] [place], [place] [address]     Labs: CBC: Recent Labs  Lab 09/20/23 1143 09/21/23 0954 09/22/23 0200 09/23/23 0538  WBC 5.6 9.6 7.2 8.1  NEUTROABS 3.1  --   --   --   HGB 11.4* 12.1 10.5* 10.3*  HCT 35.9* 37.5 32.5* 31.1*  MCV 90.9 90.4 88.1 87.9  PLT 171 206 172 177   Basic Metabolic Panel: Recent Labs  Lab 09/20/23 1143 09/21/23 0954 09/22/23 0200 09/22/23 1923 09/23/23 0538  NA 137 137 135  --  141  K 3.1* 3.5 5.1  --  4.4  CL 97* 96* 96*  --  102  CO2 28 30 25   --  30  GLUCOSE 145* 227* 276*  --  130*  BUN 17 15 18   --  12  CREATININE 1.53* 1.36* 1.38*  --  0.92  CALCIUM 7.0* 7.7* 7.4*  --  7.8*  MG  --   --   --  1.5*  --    Liver Function Tests: Recent Labs  Lab 09/20/23 1143 09/21/23 1157 09/22/23 0200  AST 62* 49* 56*  ALT 30 28 28   ALKPHOS 75 84 72  BILITOT 0.5 0.4 0.5  PROT 7.0 7.5 6.6  ALBUMIN 3.6 3.8 3.0*  2.9*   CBG: Recent Labs  Lab 09/22/23 2041 09/22/23 2346 09/23/23 0352 09/23/23 0750 09/23/23 1143  GLUCAP 209* 162*  126* 112* 148*    Discharge time spent: greater than 30 minutes.  Signed: Delfino Lovett, MD [facility] 09/23/2023

## 2023-09-23 NOTE — Progress Notes (Addendum)
Patient frequently removing pulse ox. Patient educated on importance of keeping it on.

## 2023-09-23 NOTE — Progress Notes (Signed)
Patient pulled out all EEG wires, informed EEG tech. Transferred patient to 3W05.

## 2023-09-23 NOTE — H&P (Signed)
History and Physical    Patient: Brittany Nguyen ZOX:[id number] DOB: 1966/03/12 DOA: 09/23/2023 DOS: the patient was seen and examined on 09/23/2023 PCP: Jerl Mina, MD  Patient coming from: [facility] transfer  Chief Complaint: Seizures  HPI: Brittany Nguyen is a 58 y.o. 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  History is obtained from review of records as she is currently unable to provide history as she appears to be postictal and has perseveration what ever is said to her.  Patient had been reportedly taking Tamiflu intermittently.  Respiratory status reportedly declined despite using home inhalers.  In the ER was noted to be afebrile stable vital signs satting well on room air.  Labs from 1/16 significant for CBC within normal limits, BUN 15, creatinine 1.3 6,  glucose 227,  AST 49, and lactic acid 1.9-> 3.1.  Patient was noted to have had a seizure evening of 1/16.  Neurology had been consulted after second seizure on 9/17 where rapid response was called.  Repeat lactic acid was noted to trend up on 1/17 up to 6.2.  Patient had a third seizure on the evening of 1/17 for which continuous EEG monitoring was recommended and transferred to [facility].  After arrival here patient was reported to be responsive for approximately 20 minutes prior to coming to.      Review of Systems: unable to review all systems due to the inability of the patient to answer questions. Past Medical History:  Diagnosis Date   Acute anterolateral wall MI (HCC) 08/2016   Acute respiratory failure (HCC)    AICD (automatic  cardioverter/defibrillator) present 03/29/2017   a.) Medtronic Evera MRI XT DR SureScan (SN: [id number])   Anxiety    a.) on BZO PRN (lorazepam)   Arthritis    Asthma 2013   Breast cancer, right (HCC)    C. 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   a.) Bx 02/22/2018 --> NE tumor cells --> AE1/AE3, chromogranin, synaptophysin (+); Ki-67 proliferation rate <1%; b.) repeat Bx 08/24/2018 --> well differentiard NET (G1)   CHF (congestive heart failure) (HCC)    Chronic, continuous use of opioids    a.) has naloxone Rx available   COPD (chronic obstructive pulmonary disease) (HCC)    Coronary artery disease    Depression    Diet-controlled type 2 diabetes mellitus (HCC)    Diverticulitis 2010   DVT (deep venous thrombosis) (HCC)    Endometriosis 1990   Generalized epilepsy (HCC)    a.) on lacosamide; b.) last seizure 07-30-22 in setting of hypokalemia (K+ 2.6)   GERD (gastroesophageal reflux disease)    GIB (gastrointestinal bleeding) 01/08/2018   H/O syncope    HFrEF (heart failure with reduced ejection fraction) (HCC)    a.)TTE 12/09/16: EF 25-30%, dif HK; b.) TTE 02/08/17: EF 25%, dif HK, mild TR, G1DD; c.) TTE 04/25/17: EF 30-35%, mild LVH; d.) TTE 01/30/18: EF 45%, MAC, LAE, mild MR, G1DD; e.) TTE 11/06/2018: EF 25%, sev glob HK, triv MR/TR/PR; f.) TTE 05/12/19: EF 35%, MAC, AoV sclerosis, G1DD; g.) TTE 11/05/19: EF 20%, sev glob HK, triv MR;  h.) TTE 06/17/20: EF 40%, mod glob HK, triv MR; I:) TTE 06/14/22: EF 25-30%, G1DD   History of cardiac catheterization    a.) LHC 10/21/2014: normal coronaries; b.) R/LHC 02/24/2017: normal coronaries, mRA 12, mPA 28, mPCWP 15, CO 8.0, CI 3.8   History of kidney stones    Hx MRSA infection    Hypertension    Hypokalemia    Hypothyroidism    Iron deficiency anemia 03/27/2018   Lump or mass in breast 11/27/2012   a.) Bx 11/27/2012 --> apocrine wall cyst   Migraine    Nausea & vomiting    Neuropathy     Non-ischemic cardiomyopathy (HCC)    a.) TTE 12/09/2016: EF 25-30%, b.) TTE 02/08/2017: EF 25%; c.) Medtronic AICD placed 03/29/2017; d.) TTE 04/25/2017: EF 30-35%; e.) TTE 01/30/2018: EF 45%; f.) TTE 11/06/2018: EF 25%; g.) TTE 05/12/2019: EF 35%; h.) TTE 11/05/2019: EF 20%; I.) TTE 06/17/2020: EF 40%; J.) TTE 06/14/2022: EF 25-30%   Obesity    OSA on CPAP    PAF (paroxysmal atrial fibrillation) (HCC)    Pneumonia    CAP   PONV (postoperative nausea and vomiting)    Post-COVID chronic cough    Postcholecystectomy diarrhea    Prolonged Q-T interval on ECG    RAD (reactive airway disease)    Restless leg    a.) on ropinirole   Seizure (HCC)    Sepsis (HCC)    Unstable angina (HCC)    Past Surgical History:  Procedure Laterality Date   ABDOMINAL HYSTERECTOMY     age 34   BREAST BIOPSY Right 2014   benign   BREAST BIOPSY Right 01/18/2023   [place] Core Bx, coil clip - path pending   BREAST BIOPSY Right 01/18/2023   [place] RT BREAST BX W LOC DEV 1ST LESION IMG BX SPEC US GUIDE 01/18/2023 [facility]   CARDIAC DEFIBRILLATOR PLACEMENT Left 03/29/2017   Procedure: MEDTRONIC CARDIAC DEFIBRILLATOR PLACEMENT (AICD); Location: [facility]; Surgeon: Bonne Dolores, MD   CESAREAN SECTION     CHOLECYSTECTOMY     COLECTOMY     COLONOSCOPY WITH PROPOFOL N/A 02/22/2018   Procedure: COLONOSCOPY WITH PROPOFOL;  Surgeon: Toledo, Boykin Nearing, MD;  Location: [facility];  Service: Gastroenterology;  Laterality: N/A;   ESOPHAGOGASTRODUODENOSCOPY (EGD) WITH PROPOFOL N/A 02/22/2018   Procedure: ESOPHAGOGASTRODUODENOSCOPY (EGD) WITH PROPOFOL;  Surgeon: Toledo, Boykin Nearing, MD;  Location: [facility];  Service: Gastroenterology;  Laterality: N/A;   IVC FILTER INSERTION N/A 08/23/2022   Procedure: IVC FILTER INSERTION;  Surgeon: Renford Dills, MD;  Location: [facility];  Service: Cardiovascular;  Laterality: N/A;   IVC FILTER REMOVAL N/A 03/07/2023   Procedure: IVC FILTER REMOVAL;  Surgeon: Renford Dills, MD;  Location: [facility];  Service: Cardiovascular;  Laterality: N/A;   KNEE ARTHROPLASTY Left 08/24/2022   Procedure: COMPUTER ASSISTED TOTAL KNEE ARTHROPLASTY;  Surgeon: Donato Heinz, MD;  Location: [facility] ORS;  Service: Orthopedics;  Laterality: Left;   LEFT HEART CATH AND CORONARY ANGIOGRAPHY Left 10/21/2014   Procedure: LEFT HEART CATH AND CORONARY ANGIOGRAPHY; Location: [facility]; Surgeon: Arnoldo Hooker, MD   NASAL SINUS SURGERY  2012   OOPHORECTOMY     RIGHT/LEFT HEART CATH AND CORONARY ANGIOGRAPHY Bilateral 02/24/2017   Procedure: RIGHT/LEFT HEART CATH AND CORONARY ANGIOGRAPHY; Location: [facility]   THYROIDECTOMY     Social History:  reports that she has never smoked. She has never used smokeless tobacco. She reports that she does not drink alcohol and does not  use drugs.  Allergies  Allergen Reactions   Contrast Media [Iodinated Contrast Media] Shortness Of Breath    Per CT report in 2014 pt had break through contrast reaction with hives and SOB following 13 hour prep. MSY    Ferumoxytol Nausea Only   Iron Sucrose Anaphylaxis   Lidocaine Hives   Metrizamide Shortness Of Breath   Penicillins Hives and Other (See Comments)    IgE = 4 (WNL) on 08/12/2022  ediate rash, facial/tongue/throat swelling, SOB or lightheadedness with hypotension, tachy   Sacubitril-Valsartan     Sz (unclear if new start entresto vs hypoglycemia)   Isosorbide Nitrate Other (See Comments)    Headache   Latex Hives    IgE < 0.10 (WNL) on 08/21/2023   Ondansetron Other (See Comments)    Severe headache   Tizanidine Other (See Comments)    Feels altered   Povidone-Iodine Rash    Blistering rash   Pulmicort [Budesonide] Itching    Family History  Problem Relation Age of Onset   Breast cancer Mother    Cancer Mother 77       ovarian   CAD Mother    Cancer Father 80       brain   CAD Father    Cancer Daughter 48       skin   Breast cancer Maternal Aunt    Cancer Maternal Aunt 34        breast   Leukemia Paternal Grandfather    Breast cancer Cousin     Prior to Admission medications   Medication Sig Start Date End Date Taking? Authorizing Provider  albuterol (VENTOLIN HFA) 108 (90 Base) MCG/ACT inhaler Inhale 1-2 puffs into the lungs every 6 (six) hours as needed for wheezing or shortness of breath.    [provider]  escitalopram (LEXAPRO) 10 MG tablet Take 10 mg by mouth daily. 11/24/22   [provider]  gabapentin (NEURONTIN) 300 MG capsule Take 1 capsule (300 mg total) by mouth at bedtime. Patient taking differently: Take 300 mg by mouth 3 (three) times daily. 09/20/22   Sunnie Nielsen, DO  guaiFENesin-codeine 100-10 MG/5ML syrup Take 5 mLs by mouth every 6 (six) hours as needed for cough. 09/18/23   Minna Antis, MD  lacosamide 200 mg in sodium chloride 0.9 % 25 mL Inject 200 mg into the vein every 12 (twelve) hours. 09/23/23   Delfino Lovett, MD  levETIRAcetam (KEPPRA) 1000 MG/100ML SOLN Inject 100 mLs (1,000 mg total) into the vein every 12 (twelve) hours. 09/23/23   Delfino Lovett, MD  levothyroxine (SYNTHROID) 175 MCG tablet Take 175 mcg by mouth every morning. 12/17/21   [provider]  LORazepam (ATIVAN) 1 MG tablet Take 1 tablet (1 mg total) by mouth 4 (four) times daily as needed. 11/10/21   Arnetha Courser, MD  methadone (DOLOPHINE) 10 MG tablet Take 1 tablet (10 mg total) by mouth at bedtime. 09/23/23   Delfino Lovett, MD  methadone (DOLOPHINE) 5 MG tablet Take 1.5 tablets (7.5 mg total) by mouth 2 (two) times daily at 10 am and 4 pm. 09/23/23   Delfino Lovett, MD  rOPINIRole (REQUIP) 4 MG tablet Take 4 mg by mouth at bedtime. 01/30/19   [provider]    Physical Exam: Vitals:   09/23/23 1654 09/23/23 1710  BP: 121/87 128/82  Pulse: 83 79  Resp: 19 14  SpO2: 93% 94%     Constitutional: Obese female currently in no acute distress Eyes: PERRL, lids and conjunctivae  normal ENMT: Mucous membranes are moist. Normal  dentition.  Neck: normal, supple Respiratory: clear to auscultation bilaterally, no wheezing, no crackles. Normal respiratory effort. No accessory muscle use.  Cardiovascular: Regular rate and rhythm, no murmurs / rubs / gallops. No extremity edema. 2+ pedal pulses. Abdomen: no tenderness, no masses palpated. . Bowel sounds positive.  Musculoskeletal: no clubbing / cyanosis. No joint deformity upper and lower extremities. Good ROM, no contractures. Normal muscle tone.  Skin: no rashes, lesions, ulcers. No induration Neurologic: CN 2-12 grossly intact.  Moves all extremities. Psychiatric: Unable to assess orientation time. Data Reviewed:  reviewed labs, imaging, and pertinent records as documented.  Assessment and Plan: Recurrent seizures Patient reported to have had 3 seizures since admission on 1/16.  CT scan of the head no acute abnormality.  She had been evaluated by neurology and loaded with Keppra IV.  Repeat CT scan of head as unable to obtain MRI due to AICD, was also negative for any acute abnormality. -Admit to a progressive bed -Neurochecks -Seizure precautions -N.p.o. with orders to advance diet as tolerated once patient able to follow commands. -Continuous EEG monitoring -Continue Keppra and Vimpat  COPD Influenza A Patient diagnosed with influenza A on 1/15.  Chest x-ray otherwise noted to be clear. -Continue Tamiflu to complete course -Continue steroid taper  Chronic systolic and diastolic congestive heart failure Stable.  Last EF noted to be 45% with grade 1 diastolic dysfunction back in 01/2018. -Strict I&O's and daily weights  Essential hypertension Blood pressures were noted to be soft. -Holding previous blood pressure regimen at this time.  Reassess and determine when medically appropriate to resume  Diabetes mellitus type 2 without complication Blood sugars have been noted to be elevated into the 200s earlier today.  Last hemoglobin A1c noted to be 6.4 on  08/21/2023. -Hypoglycemic protocols -CBGs before every meal with sensitive SSI -Adjust regimen as needed  Normocytic anemia Chronic.  Hemoglobin 10.3 which appears around patient previous baseline. -Recheck CBC tomorrow morning  Hypothyroidism -Add on TSH -Continue levothyroxine  Depression and anxiety -Continue Lexapro  S/p AICD  OSA on CPAP -Continue CPAP   DVT prophylaxis: Lovenox Advance Care Planning:   Code Status: Full Code   Consults: Neurology  Severity of Illness: The appropriate patient status for this patient is INPATIENT. Inpatient status is judged to be reasonable and necessary in order to provide the required intensity of service to ensure the patient's safety. The patient's presenting symptoms, physical exam findings, and initial radiographic and laboratory data in the context of their chronic comorbidities is felt to place them at high risk for further clinical deterioration. Furthermore, it is not anticipated that the patient will be medically stable for discharge from the hospital within 2 midnights of admission.   * I certify that at the point of admission it is my clinical judgment that the patient will require inpatient hospital care spanning beyond 2 midnights from the point of admission due to high intensity of service, high risk for further deterioration and high frequency of surveillance required.*  Author: Clydie Braun, MD 09/23/2023 5:09 PM  For on call review [email or web address].

## 2023-09-23 NOTE — Significant Event (Signed)
Rapid Response Event Note   Reason for Call :  Unresponsive to sternal rub  Initial Focused Assessment:  Patient is warm and dry.  She is minimally responsive to deep painful stimuli. After orally suctioning patient she started to become more awake.  She is still drowsy but is starting to keep her eyes open and engage with staff.  She is coughing frequently. Pupils are now 3 and brisk.  BP 121/87  HR 83  RR 20 O2 sat 93 Temp 97.5 CBG 227  She is starting to wake up and answer some questions and follow some commands.  She is still confused and disoriented.  She is able to move all her extremities equally.   Interventions:  Dr Amada Jupiter at bedside,  EEG ordered Dr Madelyn Flavors at bedside.  EEG tech at bedside placing EEG  Plan of Care:  Transfer to neuro progressive department   Event Summary:   MD Notified: Dr Amada Jupiter and Dr Katrinka Blazing Call Time: 1654 Arrival Time: 1658 End Time: 1545  Marcellina Millin, RN

## 2023-09-23 NOTE — Plan of Care (Signed)
  Problem: Metabolic: Goal: Ability to maintain appropriate glucose levels will improve Outcome: Progressing   Problem: Nutritional: Goal: Maintenance of adequate nutrition will improve Outcome: Progressing   Problem: Skin Integrity: Goal: Risk for impaired skin integrity will decrease Outcome: Progressing   Problem: Clinical Measurements: Goal: Diagnostic test results will improve Outcome: Progressing Goal: Respiratory complications will improve Outcome: Progressing   Problem: Pain Managment: Goal: General experience of comfort will improve and/or be controlled Outcome: Progressing   Problem: Education: Goal: Knowledge of disease or condition will improve Outcome: Progressing   Problem: Respiratory: Goal: Ability to maintain a clear airway will improve Outcome: Progressing

## 2023-09-24 ENCOUNTER — Other Ambulatory Visit: Payer: Self-pay

## 2023-09-24 DIAGNOSIS — G40909 Epilepsy, unspecified, not intractable, without status epilepticus: Secondary | ICD-10-CM | POA: Diagnosis not present

## 2023-09-24 LAB — GLUCOSE, CAPILLARY
Glucose-Capillary: 126 mg/dL — ABNORMAL HIGH (ref 70–99)
Glucose-Capillary: 145 mg/dL — ABNORMAL HIGH (ref 70–99)
Glucose-Capillary: 149 mg/dL — ABNORMAL HIGH (ref 70–99)
Glucose-Capillary: 176 mg/dL — ABNORMAL HIGH (ref 70–99)

## 2023-09-24 LAB — CBC
HCT: 36.8 % (ref 36.0–46.0)
Hemoglobin: 11.9 g/dL — ABNORMAL LOW (ref 12.0–15.0)
MCH: 28.6 pg (ref 26.0–34.0)
MCHC: 32.3 g/dL (ref 30.0–36.0)
MCV: 88.5 fL (ref 80.0–100.0)
Platelets: 197 10*3/uL (ref 150–400)
RBC: 4.16 MIL/uL (ref 3.87–5.11)
RDW: 15.1 % (ref 11.5–15.5)
WBC: 8.1 10*3/uL (ref 4.0–10.5)
nRBC: 0 % (ref 0.0–0.2)

## 2023-09-24 LAB — LACTIC ACID, PLASMA: Lactic Acid, Venous: 2.2 mmol/L (ref 0.5–1.9)

## 2023-09-24 LAB — BASIC METABOLIC PANEL
Anion gap: 10 (ref 5–15)
BUN: 9 mg/dL (ref 6–20)
CO2: 30 mmol/L (ref 22–32)
Calcium: 8.7 mg/dL — ABNORMAL LOW (ref 8.9–10.3)
Chloride: 100 mmol/L (ref 98–111)
Creatinine, Ser: 1.05 mg/dL — ABNORMAL HIGH (ref 0.44–1.00)
GFR, Estimated: >60 mL/min (ref >60)
Glucose, Bld: 135 mg/dL — ABNORMAL HIGH (ref 70–99)
Potassium: 4.5 mmol/L (ref 3.5–5.1)
Sodium: 140 mmol/L (ref 135–145)

## 2023-09-24 LAB — T4, FREE: Free T4: 0.46 ng/dL — ABNORMAL LOW (ref 0.61–1.12)

## 2023-09-24 LAB — LACOSAMIDE: Lacosamide: 14.8 ug/mL — ABNORMAL HIGH (ref 5.0–10.0)

## 2023-09-24 NOTE — Progress Notes (Signed)
Per RN, leads pulled off again. Will reapply as schedule allows

## 2023-09-24 NOTE — Plan of Care (Signed)
  Problem: Clinical Measurements: Goal: Ability to maintain clinical measurements within normal limits will improve Outcome: Progressing   Problem: Clinical Measurements: Goal: Will remain free from infection Outcome: Progressing   Problem: Clinical Measurements: Goal: Respiratory complications will improve Outcome: Progressing   Problem: Nutrition: Goal: Adequate nutrition will be maintained Outcome: Progressing   Problem: Activity: Goal: Risk for activity intolerance will decrease Outcome: Progressing   Problem: Elimination: Goal: Will not experience complications related to bowel motility Outcome: Progressing   Problem: Skin Integrity: Goal: Risk for impaired skin integrity will decrease Outcome: Progressing   Problem: Safety: Goal: Ability to remain free from injury will improve Outcome: Progressing   Problem: Pain Managment: Goal: General experience of comfort will improve and/or be controlled Outcome: Progressing

## 2023-09-24 NOTE — Progress Notes (Signed)
PROGRESS NOTE    Brittany Nguyen  MVH:[id number] DOB: 25-Oct-1965 DOA: 09/23/2023 PCP: Jerl Mina, MD    Brief Narrative:   Brittany Nguyen is a 58 y.o. 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  History is obtained from review of records as she is currently unable to provide history as she appears to be postictal and has perseveration what ever is said to her.  Patient had been reportedly taking Tamiflu intermittently.  Respiratory status reportedly declined despite using home inhalers.  In the ER was noted to be afebrile stable vital signs satting well on room air.  Labs from 1/16 significant for CBC within normal limits, BUN 15, creatinine 1.3 6,  glucose 227,  AST 49, and lactic acid 1.9-> 3.1.  Patient was noted to have had a seizure evening of 1/16.  Neurology had been consulted after second seizure on 9/17 where rapid response was called.  Repeat lactic acid was noted to trend up on 1/17 up to 6.2.  Patient had a third seizure on the evening of 1/17 for which continuous EEG monitoring was recommended and transferred to [facility].  After arrival here patient was reported to be responsive for approximately 20 minutes prior to coming to.   Assessment & Plan:   Seizure disorder with concern for breakthrough Patient reported to have had 3 seizures since admission on 1/16.  CT scan of the head no acute abnormality.  She had been evaluated by neurology and loaded with Keppra IV.   -- Neurology following, appreciate assistance -- Keppra 1g IV q12h -- Vimpat 200mg  IV q12h -- On continuous EEG, currently no  seizure or elliptic form discharges noted -- MRI brain with and without contrast: Pending -- Seizure precautions --Await further neurology recommendations   COPD Influenza A Patient diagnosed with influenza A on 1/15.  Chest x-ray otherwise noted to be clear. -- Continue Tamiflu 75 mg p.o. twice daily x 5 days -- Prednisone 40 mg p.o. daily -- Albuterol neb every 6 hours.  Wheezing/shortness of breath -- Droplet precautions   Chronic systolic and diastolic congestive heart failure, compensated TTE with last EF noted to be 45% with grade 1 diastolic dysfunction back in 01/2018. -- Strict I&O's and daily weights   Essential hypertension -- BP 139/86 -- Not on antihypertensives outpatient   Diabetes mellitus type 2 without complication Hemoglobin A1c 6.4 on 08/21/2023. -- sensitive SSI for coverage -- CBGs qAC/HS   Normocytic anemia, chronic Hemoglobin 10.3, stable   Hypothyroidism TSH 21.828.  On levothyroxine 175 mcg p.o. daily outpatient.  Concern for possible noncompliance. -- Check free T4 -- Continue levothyroxine 175 mcg PO daily   Depression and anxiety -- Continue Lexapro 10 mg p.o. daily   S/p AICD Placed at [facility] 03/29/2017.  Lead Icd  Sgl Coil Tripolar Active Fix Df-4 Willadean Carol - [id number] - Implanted   Inventory item: Lead Icd Sgl Coil Tripolar Active Fix Df-4 Conn Sprin Model/Cat number: [id number] Serial number: [id number] Manufacturer: [employer] [place] Device identifier: [id number] Device identifier type: GS1 GUDID Information   Request status Successful   Brand name: Sprint Quattro Secure S MRIT SureScanT Version/Model: [id number]  ICD Device [name] - [id number] - Implanted   Inventory item: Device [name] Model/Cat number: [id number] Serial number: [id number] [facility] Manufacturer: [employer] [place] Device identifier: [id number] Device identifier type: GS1 GUDID Information   Request status Successful    Brand name: Evera MRIT XT DR SureScan Version/Model: [id number] Company name: [employer] MRI safety info as of 03/29/17: MR Conditional Contains dry or latex rubber: No   GMDN P.T. name: Dual-chamber implantable defibrillator    Company name: [employer] MRI safety info as of 03/29/17: MR Conditional   Chronic pain syndrome -- Gabapentin 300 mg PO TID -- Methadone 7.5 mg p.o. twice daily, 10 mg p.o. nightly  RLS: --Requip 4 mg p.o. nightly   OSA on CPAP -- Continue nocturnal CPAP   DVT prophylaxis: enoxaparin (LOVENOX) injection 40 mg Start: 09/23/23 1830    Code Status: Full Code Family Communication: No family present at bedside this morning.  Disposition Plan:  Level of care: Progressive Status is: Inpatient Remains inpatient appropriate because: Continuous EEG, MR brain pending awaiting further neurology recommendations    Consultants:  Neurology  Procedures:  Continuous EEG  Antimicrobials:  None   Subjective: Patient seen examined bedside, RN and EEG tech present.  Remains confused, but does follow commands.  Overnight, pulling off EEG leads, trying to stand up.  Mitts placed on patient.  No family present.  Unable to obtain any further ROS given her generalized confusion.  Other than pulling off her EEG leads, no acute concerns overnight per nursing staff.  Objective: Vitals:   09/23/23 2352 09/24/23 0200 09/24/23 0407 09/24/23 0819  BP: 134/87  139/86 (!) 130/91  Pulse: 69  71   Resp:    14  Temp: 98.3 F (36.8 C)  97.9 F (36.6 C) 98.1 F (36.7 C)  TempSrc: Oral  Oral Oral  SpO2: 98% 98% 95% 92%    Intake/Output Summary (Last 24 hours) at 09/24/2023 1127 Last data filed at 09/24/2023 0132 Gross per 24 hour  Intake 346.48 ml  Output --  Net 346.48 ml   There were no vitals filed for this visit.  Examination:  Physical Exam: GEN: NAD, alert, not oriented to person/place/time or situation (will not give any answer to questions), ill in  appearance HEENT: NCAT, PERRL, EOMI, sclera clear, MMM PULM: CTAB w/o wheezes/crackles, normal respiratory effort CV: RRR w/o M/G/R GI: abd soft, NTND, + BS MSK: no peripheral edema, muscle strength globally intact 5/5 bilateral upper/lower extremities NEURO: CN II-XII intact, no focal deficits, sensation to light touch intact PSYCH: Depressed mood, flat affect Integumentary: No concerning rashes/lesions/wounds noted on exposed skin surfaces    Data Reviewed: I have personally reviewed following labs and imaging studies  CBC: Recent Labs  Lab 09/20/23 1143 09/21/23 0954 09/22/23 0200 09/23/23 0538  WBC 5.6 9.6 7.2 8.1  NEUTROABS 3.1  --   --   --   HGB 11.4* 12.1 10.5* 10.3*  HCT 35.9* 37.5 32.5* 31.1*  MCV 90.9 90.4 88.1 87.9  PLT 171 206 172 177   Basic Metabolic Panel: Recent Labs  Lab 09/20/23 1143 09/21/23 0954 09/22/23 0200 09/22/23 1923 09/23/23 0538 09/23/23 2005  NA 137 137 135  --  141  --   K 3.1* 3.5 5.1  --  4.4  --   CL 97* 96* 96*  --  102  --   CO2 28 30 25   --  30  --   GLUCOSE 145*  227* 276*  --  130*  --   BUN 17 15 18   --  12  --   CREATININE 1.53* 1.36* 1.38*  --  0.92  --   CALCIUM 7.0* 7.7* 7.4*  --  7.8*  --   MG  --   --   --  1.5*  --  2.7*   GFR: Estimated Creatinine Clearance: 76.9 mL/min (by C-G formula based on SCr of 0.92 mg/dL). Liver Function Tests: Recent Labs  Lab 09/20/23 1143 09/21/23 1157 09/22/23 0200  AST 62* 49* 56*  ALT 30 28 28   ALKPHOS 75 84 72  BILITOT 0.5 0.4 0.5  PROT 7.0 7.5 6.6  ALBUMIN 3.6 3.8 3.0*  2.9*   No results for input(s): "LIPASE", "AMYLASE" in the last 168 hours. No results for input(s): "AMMONIA" in the last 168 hours. Coagulation Profile: No results for input(s): "INR", "PROTIME" in the last 168 hours. Cardiac Enzymes: No results for input(s): "CKTOTAL", "CKMB", "CKMBINDEX", "TROPONINI" in the last 168 hours. BNP (last 3 results) No results for input(s): "PROBNP" in the last 8760  hours. HbA1C: No results for input(s): "HGBA1C" in the last 72 hours. CBG: Recent Labs  Lab 09/23/23 1143 09/23/23 1715 09/23/23 2043 09/23/23 2223 09/24/23 0610  GLUCAP 148* 227* 167* 166* 126*   Lipid Profile: No results for input(s): "CHOL", "HDL", "LDLCALC", "TRIG", "CHOLHDL", "LDLDIRECT" in the last 72 hours. Thyroid Function Tests: Recent Labs    09/23/23 2005  TSH 21.828*   Anemia Panel: No results for input(s): "VITAMINB12", "FOLATE", "FERRITIN", "TIBC", "IRON", "RETICCTPCT" in the last 72 hours. Sepsis Labs: Recent Labs  Lab 09/21/23 2325 09/22/23 0200 09/22/23 1152 09/22/23 1804  LATICACIDVEN 5.5* 6.8* 4.4* 6.2*    Recent Results (from the past 240 hours)  Resp panel by RT-PCR (RSV, Flu A&B, Covid) Anterior Nasal Swab     Status: Abnormal   Collection Time: 09/18/23  6:02 PM   Specimen: Anterior Nasal Swab  Result Value Ref Range Status   SARS Coronavirus 2 by RT PCR NEGATIVE NEGATIVE Final    Comment: (NOTE) SARS-CoV-2 target nucleic acids are NOT DETECTED.  The SARS-CoV-2 RNA is generally detectable in upper respiratory specimens during the acute phase of infection. The lowest concentration of SARS-CoV-2 viral copies this assay can detect is 138 copies/mL. A negative result does not preclude SARS-Cov-2 infection and should not be used as the sole basis for treatment or other patient management decisions. A negative result may occur with  improper specimen collection/handling, submission of specimen other than nasopharyngeal swab, presence of viral mutation(s) within the areas targeted by this assay, and inadequate number of viral copies(<138 copies/mL). A negative result must be combined with clinical observations, patient history, and epidemiological information. The expected result is Negative.  Fact Sheet for Patients:  BloggerCourse.com  Fact Sheet for Healthcare Providers:   SeriousBroker.it  This test is no t yet approved or cleared by the [place] FDA and  has been authorized for detection and/or diagnosis of SARS-CoV-2 by FDA under an Emergency Use Authorization (EUA). This EUA will remain  in effect (meaning this test can be used) for the duration of the COVID-19 declaration under Section 564(b)(1) of the Act, 21 U.S.C.section 360bbb-3(b)(1), unless the authorization is terminated  or revoked sooner.       Influenza A by PCR POSITIVE (A) NEGATIVE Final   Influenza B by PCR NEGATIVE NEGATIVE Final    Comment: (NOTE) The Xpert Xpress SARS-CoV-2/FLU/RSV plus assay is intended as an aid  in the diagnosis of influenza from Nasopharyngeal swab specimens and should not be used as a sole basis for treatment. Nasal washings and aspirates are unacceptable for Xpert Xpress SARS-CoV-2/FLU/RSV testing.  Fact Sheet for Patients: BloggerCourse.com  Fact Sheet for Healthcare Providers: SeriousBroker.it  This test is not yet approved or cleared by the [place] FDA and has been authorized for detection and/or diagnosis of SARS-CoV-2 by FDA under an Emergency Use Authorization (EUA). This EUA will remain in effect (meaning this test can be used) for the duration of the COVID-19 declaration under Section 564(b)(1) of the Act, 21 U.S.C. section 360bbb-3(b)(1), unless the authorization is terminated or revoked.     Resp Syncytial Virus by PCR NEGATIVE NEGATIVE Final    Comment: (NOTE) Fact Sheet for Patients: BloggerCourse.com  Fact Sheet for Healthcare Providers: SeriousBroker.it  This test is not yet approved or cleared by the [place] FDA and has been authorized for detection and/or diagnosis of SARS-CoV-2 by FDA under an Emergency Use Authorization (EUA). This EUA will remain in effect (meaning this test can be used)  for the duration of the COVID-19 declaration under Section 564(b)(1) of the Act, 21 U.S.C. section 360bbb-3(b)(1), unless the authorization is terminated or revoked.  Performed at [facility] [place], [address] [place], [place] [address]   Culture, blood (routine x 2)     Status: None (Preliminary result)   Collection Time: 09/20/23 11:41 AM   Specimen: BLOOD  Result Value Ref Range Status   Specimen Description BLOOD BLOOD RIGHT ARM  Final   Special Requests   Final    BOTTLES DRAWN AEROBIC AND ANAEROBIC Blood Culture adequate volume   Culture   Final    NO GROWTH 4 DAYS Performed at [facility], [address] [place], [place] [address]    Report Status PENDING  Incomplete  MRSA Next Gen by PCR, Nasal     Status: None   Collection Time: 09/22/23  7:19 PM   Specimen: Nasal Mucosa; Nasal Swab  Result Value Ref Range Status   MRSA by PCR Next Gen NOT DETECTED NOT DETECTED Final    Comment: (NOTE) The GeneXpert MRSA Assay (FDA approved for NASAL specimens only), is one component of a comprehensive MRSA colonization surveillance program. It is not intended to diagnose MRSA infection nor to guide or monitor treatment for MRSA infections. Test performance is not FDA approved in patients less than 72 years old. Performed at [facility], [address] [place], [place] [address]          Radiology Studies: Overnight EEG with video Result Date: 09/24/2023 Charlsie Quest, MD     09/24/2023  9:31 AM Patient Name: Brittany Nguyen MRN: [id number] Epilepsy Attending: Charlsie Quest Referring Physician/Provider: Rejeana Brock, MD Duration: 09/23/2023 1810 to 09/24/2023 0316 Patient history: 58yo F with seizure like activity getting eeg to evaluate for seizure Level of alertness: Awake/ lethargic AEDs during EEG study: LEV, LCM, GBP Technical aspects: This EEG study was done with scalp electrodes positioned according to the 10-20 International  system of electrode placement. Electrical activity was reviewed with band pass filter of 1-70Hz , sensitivity of 7 uV/mm, display speed of 72mm/sec with a 60Hz  notched filter applied as appropriate. EEG data were recorded continuously and digitally stored.  Video monitoring was available and reviewed as appropriate. Description: EEG showed continuous generalized 3 to 6 Hz theta-delta slowing. Hyperventilation and photic stimulation were not performed.  EEG was difficult to interpret between 09/23/2023  2105 to 09/24/2023 0024 and on  09/24/2023 after 0316 as patient pulled all electrodes. ABNORMALITY - Continuous slow, generalized IMPRESSION: This study is suggestive of moderate diffuse encephalopathy. No seizures or epileptiform discharges were seen throughout the recording. Priyanka Annabelle Harman   CT HEAD WO CONTRAST ( ) Result Date: 09/22/2023 CLINICAL DATA:  Seizure disorder. EXAM: CT HEAD WITHOUT CONTRAST TECHNIQUE: Contiguous axial images were obtained from the base of the skull through the vertex without intravenous contrast. RADIATION DOSE REDUCTION: This exam was performed according to the departmental dose-optimization program which includes automated exposure control, adjustment of the mA and/or kV according to patient size and/or use of iterative reconstruction technique. COMPARISON:  None Available. FINDINGS: Brain: No mass,hemorrhage or extra-axial collection. Normal appearance of the parenchyma and CSF spaces. Vascular: No hyperdense vessel or unexpected vascular calcification. Skull: The visualized skull base, calvarium and extracranial soft tissues are normal. Sinuses/Orbits: No fluid levels or advanced mucosal thickening of the visualized paranasal sinuses. No mastoid or middle ear effusion. Normal orbits. Other: None. IMPRESSION: Normal head CT. Electronically Signed   By: Deatra Robinson M.D.   On: 09/22/2023 20:48   EEG adult Result Date: 09/22/2023 Rejeana Brock, MD     09/22/2023  7:32 PM  History: 58 year old female being evaluated for possible seizures Sedation: Ativan given earlier Patient State: Awake and asleep Technique: This EEG was acquired with electrodes placed according to the International 10-20 electrode system (including Fp1, Fp2, F3, F4, C3, C4, P3, P4, O1, O2, T3, T4, T5, T6, A1, A2, Fz, Cz, Pz). The following electrodes were missing or displaced: none. Background: The background is relatively well-organized though there is a slow posterior dominant rhythm with a frequency of 6 to 7 Hz.  There is also mild intrusion of irregular slow activity in the delta and theta range.  Selectivity increases with drowsiness and sleep is recorded with symmetric appearing structures.  There is no epileptiform activity seen. Photic stimulation: Physiologic driving is not performed EEG Abnormalities: 1) generalized irregular slow activity 2) slow posterior dominant rhythm Clinical Interpretation: This EEG is consistent with a mild generalized nonspecific cerebral dysfunction (encephalopathy). There was no seizure or seizure predisposition recorded on this study. Please note that lack of epileptiform activity on EEG does not preclude the possibility of epilepsy. Ritta Slot, MD [facility] [phone] If 7pm- 7am, please page neurology on call as listed in AMION.       Scheduled Meds:  enoxaparin (LOVENOX) injection  40 mg Subcutaneous Q24H   escitalopram  10 mg Oral Daily   gabapentin  300 mg Oral TID   insulin aspart  0-5 Units Subcutaneous QHS   insulin aspart  0-9 Units Subcutaneous TID WC   levothyroxine  175 mcg Oral Q0600   methadone  10 mg Oral QHS   methadone  7.5 mg Oral BID   oseltamivir  75 mg Oral BID   predniSONE  40 mg Oral Q breakfast   rOPINIRole  4 mg Oral QHS   sodium chloride flush  3 mL Intravenous Q12H   Continuous Infusions:  lacosamide (VIMPAT) 200 mg in sodium chloride 0.9 % 25 mL IVPB 200 mg (09/24/23 1045)   levETIRAcetam 1,000 mg  (09/24/23 0818)     LOS: 1 day    Time spent: 52 minutes spent on chart review, discussion with nursing staff, consultants, updating family and interview/physical exam; more than 50% of that time was spent in counseling and/or coordination of care.    Alvira Philips [place], DO [facility] Available via Epic secure chat 7am-7pm After these hours, please refer to coverage provider listed on [email or web address] 09/24/2023, 11:27 AM

## 2023-09-24 NOTE — Progress Notes (Addendum)
Leads reapplied arround midnight 1/19, Neuro notified and verified rehook was needed.

## 2023-09-24 NOTE — Procedures (Signed)
Patient Name: Clotilde Sisak  MRN: [id number]  Epilepsy Attending: Charlsie Quest  Referring Physician/Provider: Rejeana Brock, MD  Duration: 09/23/2023 1810 to 09/25/2023 1401  Patient history: 57yo F with seizure like activity getting eeg to evaluate for seizure  Level of alertness: Awake/ lethargic   AEDs during EEG study: LEV, LCM, GBP  Technical aspects: This EEG study was done with scalp electrodes positioned according to the 10-20 International system of electrode placement. Electrical activity was reviewed with band pass filter of 1-70Hz , sensitivity of 7 uV/mm, display speed of 59mm/sec with a 60Hz  notched filter applied as appropriate. EEG data were recorded continuously and digitally stored.  Video monitoring was available and reviewed as appropriate.  Description: EEG showed continuous generalized 3 to 6 Hz theta-delta slowing. Hyperventilation and photic stimulation were not performed.    EEG was difficult to interpret between 09/23/2023 2105 to 09/24/2023 0024 and on  09/24/2023 between 0316 to 0931 as patient pulled all electrodes.   ABNORMALITY - Continuous slow, generalized  IMPRESSION: This study is suggestive of moderate diffuse encephalopathy. No seizures or epileptiform discharges were seen throughout the recording.  Lenon Kuennen Annabelle Harman

## 2023-09-24 NOTE — Progress Notes (Signed)
03:35H  Patient pulled EEG lead. Trying to stand up to pee. EEG tech informed. Dr. Monica Becton, MD informed.

## 2023-09-24 NOTE — Progress Notes (Signed)
Patient has removed her EEG leads multiple times since admission.   RN and MD Bhagat discussed need for interventions to keep EEG leads in place.   Tele-Sitter ordered, soft mitts placed on patient, and reality reorientation  by RN to be implemented.

## 2023-09-24 NOTE — Progress Notes (Addendum)
1400 - Patient removed EEG leads after getting her mitts off and prior to tele sitter being able to notify RN of patient situation.   MD notified, order for bilateral soft wrist restraints placed by MD.   Patient's husband called and informed of patient status.   1430 - MD Bhagat advised in person 1:1 safety sitter needed at bedside prior to EEG being replaced.   MD [place] placed order, unfortunately no sitters available.    MD Iver Nestle and MD [place] aware.   Due to circumstances, EEG leads will not be replaced until 1/20 when safety sitter will hopefully be available.   Bilateral wrist restraints to be removed and order discontinued since these were placed to assist in keeping patient from removing EEG leads when they were replaced but are not being replaced at this time.

## 2023-09-24 NOTE — Progress Notes (Signed)
vLTM rehook with MRI leads. All impedances below 10k.  No skin breakdown noted at all skin sites.  Pt has hand mitts on now

## 2023-09-24 NOTE — Progress Notes (Addendum)
NEUROLOGY CONSULT FOLLOW UP NOTE   Date of service: September 24, 2023 Patient Name: Brittany Nguyen MRN:  [id number] DOB:  1966/06/24  Interval Hx/subjective  No seizure-like episodes since 1800 1/17 when she was loaded with Depakote as a third agent.  She continues to be drowsy and complains of headache on exam this morning.  Overnight EEG with moderate diffuse encephalopathy without seizures or epileptiform discharges.  Has self-discontinued leads multiple times, despite mitts in place and telesitter  Vitals   Vitals:   09/23/23 2352 09/24/23 0200 09/24/23 0407 09/24/23 0819  BP: 134/87  139/86 (!) 130/91  Pulse: 69  71   Resp:    14  Temp: 98.3 F (36.8 C)  97.9 F (36.6 C) 98.1 F (36.7 C)  TempSrc: Oral  Oral Oral  SpO2: 98% 98% 95% 92%    There is no height or weight on file to calculate BMI.  Physical Exam   Gen: patient lying in bed, NAD, LTM EEG monitoring in place CV: extremities appear well-perfused Resp: normal WOB, no respiratory distress   Neurologic Examination  MS: slightly drowsy, able to follow commands with repeated prompting, speaks slowly and sparsely but does not seem to have true expressive aphasia. Moderate dysarthria.  States her name correctly, states her age as 58.  Perseverates on "2".  Names watch correctly but with further naming perseverates on "2" does not follow multistep commands. CN: PERRL 5->3 mm brisk, counts fingers throughout visual fields, EOMI, tracks examiner throughout, sensation intact, face symmetric, hearing intact to voice Motor: anti-gravity in all extremities to command, equal grip strength bilaterally; formal strength exam limited 2/2 confusion Sensory: SILT Coordination: UTA, patient does not perform Gait: deferred  Medications  Current Facility-Administered Medications:    acetaminophen (TYLENOL) tablet 650 mg, 650 mg, Oral, Q6H PRN, 650 mg at 09/24/23 0821 **OR** acetaminophen (TYLENOL) suppository 650 mg, 650 mg,  Rectal, Q6H PRN, Katrinka Blazing, Rondell A, MD   albuterol (PROVENTIL) (2.5 MG/3ML) 0.083% nebulizer solution 2.5 mg, 2.5 mg, Nebulization, Q6H PRN, Smith, Rondell A, MD   enoxaparin (LOVENOX) injection 40 mg, 40 mg, Subcutaneous, Q24H, Smith, Rondell A, MD, 40 mg at 09/23/23 1801   escitalopram (LEXAPRO) tablet 10 mg, 10 mg, Oral, Daily, Smith, Rondell A, MD, 10 mg at 09/24/23 1034   gabapentin (NEURONTIN) capsule 300 mg, 300 mg, Oral, TID, Smith, Rondell A, MD, 300 mg at 09/24/23 1034   guaiFENesin-codeine 100-10 MG/5ML solution 5 mL, 5 mL, Oral, Q6H PRN, Katrinka Blazing, Rondell A, MD, 5 mL at 09/24/23 0259   insulin aspart (novoLOG) injection 0-5 Units, 0-5 Units, Subcutaneous, QHS, Smith, Rondell A, MD   insulin aspart (novoLOG) injection 0-9 Units, 0-9 Units, Subcutaneous, TID WC, Smith, Rondell A, MD, 1 Units at 09/24/23 0654   lacosamide (VIMPAT) 200 mg in sodium chloride 0.9 % 25 mL IVPB, 200 mg, Intravenous, Q12H, Smith, Rondell A, MD, Last Rate: 90 mL/hr at 09/24/23 1045, 200 mg at 09/24/23 1045   levETIRAcetam (KEPPRA) IVPB 1000 mg/100 mL premix, 1,000 mg, Intravenous, Q12H, Smith, Rondell A, MD, Last Rate: 400 mL/hr at 09/24/23 0818, 1,000 mg at 09/24/23 0818   levothyroxine (SYNTHROID) tablet 175 mcg, 175 mcg, Oral, Q0600, Madelyn Flavors A, MD, 175 mcg at 09/24/23 0654   LORazepam (ATIVAN) injection 1-2 mg, 1-2 mg, Intravenous, Q2H PRN, Katrinka Blazing, Rondell A, MD   methadone (DOLOPHINE) tablet 10 mg, 10 mg, Oral, QHS, Smith, Rondell A, MD   methadone (DOLOPHINE) tablet 7.5 mg, 7.5 mg, Oral, BID, Smith, Rondell A,  MD, 7.5 mg at 09/24/23 1033   oseltamivir (TAMIFLU) capsule 75 mg, 75 mg, Oral, BID, Smith, Rondell A, MD, 75 mg at 09/24/23 1033   predniSONE (DELTASONE) tablet 40 mg, 40 mg, Oral, Q breakfast, Katrinka Blazing, Rondell A, MD, 40 mg at 09/24/23 0654   rOPINIRole (REQUIP) tablet 4 mg, 4 mg, Oral, QHS, Smith, Rondell A, MD, 4 mg at 09/23/23 2224   sodium chloride flush (NS) 0.9 % injection 3 mL, 3 mL,  Intravenous, Q12H, Smith, Rondell A, MD, 3 mL at 09/24/23 1035   trimethobenzamide (TIGAN) injection 200 mg, 200 mg, Intramuscular, Q6H PRN, Clydie Braun, MD  Labs and Diagnostic Imaging    Basic Metabolic Panel: Recent Labs  Lab 09/20/23 1143 09/21/23 0954 09/22/23 0200 09/22/23 1923 09/23/23 0538 09/23/23 2005 09/24/23 1141  NA 137 137 135  --  141  --  140  K 3.1* 3.5 5.1  --  4.4  --  4.5  CL 97* 96* 96*  --  102  --  100  CO2 28 30 25   --  30  --  30  GLUCOSE 145* 227* 276*  --  130*  --  135*  BUN 17 15 18   --  12  --  9  CREATININE 1.53* 1.36* 1.38*  --  0.92  --  1.05*  CALCIUM 7.0* 7.7* 7.4*  --  7.8*  --  8.7*  MG  --   --   --  1.5*  --  2.7*  --     CBC: Recent Labs  Lab 09/20/23 1143 09/21/23 0954 09/22/23 0200 09/23/23 0538 09/24/23 1141  WBC 5.6 9.6 7.2 8.1 8.1  NEUTROABS 3.1  --   --   --   --   HGB 11.4* 12.1 10.5* 10.3* 11.9*  HCT 35.9* 37.5 32.5* 31.1* 36.8  MCV 90.9 90.4 88.1 87.9 88.5  PLT 171 206 172 177 197    Coagulation Studies: No results for input(s): "LABPROT", "INR" in the last 72 hours.    Lipid Panel:  Lab Results  Component Value Date   LDLCALC 142 (H) 04/25/2017   HgbA1c:  Lab Results  Component Value Date   HGBA1C 6.4 (H) 08/21/2023   Urine Drug Screen:     Component Value Date/Time   LABOPIA POSITIVE (A) 09/19/2022 1258   COCAINSCRNUR NONE DETECTED 09/19/2022 1258   LABBENZ POSITIVE (A) 09/19/2022 1258   AMPHETMU NONE DETECTED 09/19/2022 1258   THCU NONE DETECTED 09/19/2022 1258   LABBARB NONE DETECTED 09/19/2022 1258    Alcohol Level     Component Value Date/Time   ETH <10 11/30/2022 0647   INR  Lab Results  Component Value Date   INR 1.1 09/11/2022   APTT  Lab Results  Component Value Date   APTT 27 09/11/2022   AED levels:  Lab Results  Component Value Date   LEVETIRACETA <2.0 (L) 11/07/2021   CT Head without contrast 1/16 and 1/17 (Personally reviewed): Unremarkable   Overnight EEG  1/18 - 1/19:  "This study is suggestive of moderate diffuse encephalopathy. No seizures or epileptiform discharges were seen throughout the recording."  Assessment   This is a 58 year old woman with PMHx of for QTc problem deviation, HTN, DM2, COPD/asthma, CAD, CHF with EF 45% s/p AICD, hypothyroidism, depression, DVT s/p IVC filter, carcinoid tumor of the stomach, chronic pain presenting with COPD exacerbation in the setting of influenza A.  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  As an outpatient she was on lacosamide 150 mg BID.  On presentation, patient had 3 episodes concerning for focal seizures consisting of AMS, expressive aphasia, and tremulousness with postictal lethargy and confusion.  Patient reports being able to tell when the episodes are coming on but unable to provide further details.  Patient did have urinary incontinence with 1 event.    rEEG performed 1/17 showed mild diffuse encephalopathy with nothing epileptiform although no spells were captured.  Given her longstanding history of epilepsy and multiple breakthrough seizures on arrival despite repeated escalation of AED therapy, patient was transferred to [facility] for cEEG.   There has been some question in the past about whether some of her spells are psychogenic however this was speculation not based on evidence (EMU or other). Transfer for cEEG would thus provide the additional benefit of spell characterization. 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 However her seizures since admission are focal seizures with impaired awareness which suggests she may have suffered a brain injury (acute stroke, other) that has now created a specific seizure focus. MRI brain was unable to be performed at [facility] bc she has an  AICD, but she may be able to undergo MRI at [facility] if her device is determined to be MRI-compatible (have asked techs to prioritize for scan Monday)  Exam at this time most consistent with delirium (poor attention/concentration, sleepiness -- no focal strength changes, language testing limited by delirium).   She also has polypharmacy with methadone, tamiflu can contribute to encephalopathy (was started 1/13 planned for 5 day course but maybe intermittently adherent?). Encephalopathy is also described after Flu A with one case series noting recovery between 3-7 days after infection. Potentially oversedated given home methadone and marked escalation of her antiseizure medication regimen in the past few days.   Recommendations  - Continue keppra 1g q 12 hrs (max dose given her renal function) - Continue vimpat 200mg  q 12 hrs - Continue gabapentin 300 mg TID - Had been on depakote 350mg  q 6 hrs IV at [facility]; last dose 11 AM on 1/18 (was not continued on transfer to [facility]); hold further doses for now in case contributing to delirium - MRI brain wwo at [facility] if AICD is determined to be MRI-compatible (have asked MRI to prioritize this for tomorrow) - d/c tamiflu if adequate treatment has been completed  - reduce methadone dose at least temporarily if able - Seizure precautions - Continue EEG monitoring when able spell capture -- needs restraints and 1:1 sitter; may not be able to re-hook until tomorrow after MRI brain due to patient frequently pulling leads today and no sitter immediately available.  ______________________________________________________________________  Lanae Boast, AGACNP-BC [facility] Pager: [phone]  Attending Neurologist's note:  I personally saw this patient, gathering history, performing a full neurologic examination, reviewing relevant labs, personally reviewing relevant imaging including head CT, and formulated the assessment and plan, adding the note  above for completeness and clarity to accurately reflect my thoughts  Brooke Dare MD-PhD [facility] [phone] Available 7 AM to 7 PM, outside these hours please contact Neurologist on call listed on AMION

## 2023-09-24 NOTE — Progress Notes (Signed)
   09/24/23 0200  BiPAP/CPAP/SIPAP  $ Non-Invasive Home Ventilator  Initial  $ Face Mask Medium Yes  BiPAP/CPAP/SIPAP Pt Type Adult  BiPAP/CPAP/SIPAP Resmed  Mask Type Full face mask  Mask Size Medium  EPAP 8 cmH2O  FiO2 (%) 21 %  Patient Home Equipment No  Auto Titrate No  CPAP/SIPAP surface wiped down Yes  Safety Check Completed by RT for Home Unit Yes, no issues noted  BiPAP/CPAP /SiPAP Vitals  SpO2 98 %

## 2023-09-25 ENCOUNTER — Inpatient Hospital Stay (HOSPITAL_COMMUNITY): Payer: Medicare HMO

## 2023-09-25 DIAGNOSIS — G40909 Epilepsy, unspecified, not intractable, without status epilepticus: Secondary | ICD-10-CM | POA: Diagnosis not present

## 2023-09-25 LAB — CULTURE, BLOOD (ROUTINE X 2)
Culture: NO GROWTH
Special Requests: ADEQUATE

## 2023-09-25 LAB — GLUCOSE, CAPILLARY
Glucose-Capillary: 107 mg/dL — ABNORMAL HIGH (ref 70–99)
Glucose-Capillary: 198 mg/dL — ABNORMAL HIGH (ref 70–99)
Glucose-Capillary: 136 mg/dL — ABNORMAL HIGH (ref 70–99)
Glucose-Capillary: 139 mg/dL — ABNORMAL HIGH (ref 70–99)

## 2023-09-25 MED ORDER — METHADONE HCL 5 MG PO TABS
5.0000 mg | ORAL_TABLET | Freq: Every day | ORAL | Status: DC
  Administered 2023-09-25 – 2023-09-26 (×2): 5 mg via ORAL
  Filled 2023-09-25 (×2): qty 1

## 2023-09-25 MED ORDER — METHADONE HCL 5 MG PO TABS
5.0000 mg | ORAL_TABLET | Freq: Two times a day (BID) | ORAL | Status: DC
  Administered 2023-09-25 – 2023-09-27 (×4): 5 mg via ORAL
  Filled 2023-09-25 (×4): qty 1

## 2023-09-25 MED ORDER — GABAPENTIN 100 MG PO CAPS
200.0000 mg | ORAL_CAPSULE | Freq: Three times a day (TID) | ORAL | Status: DC
  Administered 2023-09-25 – 2023-09-27 (×6): 200 mg via ORAL
  Filled 2023-09-25 (×6): qty 2

## 2023-09-25 MED ORDER — LEVETIRACETAM IN NACL 1000 MG/100ML IV SOLN
500.0000 mg | Freq: Two times a day (BID) | INTRAVENOUS | Status: DC
  Administered 2023-09-25 – 2023-09-26 (×2): 500 mg via INTRAVENOUS
  Filled 2023-09-25 (×2): qty 100

## 2023-09-25 MED ORDER — LORAZEPAM 1 MG PO TABS
4.0000 mg | ORAL_TABLET | ORAL | Status: AC | PRN
  Administered 2023-09-25: 4 mg via ORAL
  Filled 2023-09-25: qty 4

## 2023-09-25 MED ORDER — GADOBUTROL 1 MMOL/ML IV SOLN: 10.0000 mL | Freq: Once | INTRAVENOUS | Status: DC | PRN

## 2023-09-25 NOTE — Plan of Care (Signed)

## 2023-09-25 NOTE — Plan of Care (Signed)
  Problem: Clinical Measurements: Goal: Ability to maintain clinical measurements within normal limits will improve Outcome: Progressing   Problem: Pain Managment: Goal: General experience of comfort will improve and/or be controlled Outcome: Progressing   Problem: Elimination: Goal: Will not experience complications related to bowel motility Outcome: Progressing   Problem: Nutrition: Goal: Adequate nutrition will be maintained Outcome: Progressing   Problem: Education: Goal: Ability to describe self-care measures that may prevent or decrease complications (Diabetes Survival Skills Education) will improve Outcome: Progressing   Problem: Safety: Goal: Non-violent Restraint(s) Outcome: Progressing   Problem: Skin Integrity: Goal: Risk for impaired skin integrity will decrease Outcome: Progressing

## 2023-09-25 NOTE — Progress Notes (Signed)
4 stud earrings removed for MRI.  Earrings given to pt's husband by this RN to take home.

## 2023-09-25 NOTE — Progress Notes (Signed)
Subjective: Per husband at bedside, no further seizure-like activity.  He denies any other concerns except that she continues to be very drowsy.  ROS: Unable to obtain due to poor mental status  Examination  Vital signs in last 24 hours: Temp:  [97.9 F (36.6 C)-98.7 F (37.1 C)] 98.3 F (36.8 C) (01/20 0841) Pulse Rate:  [59-65] 65 (01/20 0841) Resp:  [14-18] 14 (01/20 0841) BP: (115-140)/(73-87) 115/81 (01/20 0841) SpO2:  [91 %-99 %] 99 % (01/20 0841) Weight:  [100.4 kg] 100.4 kg (01/20 0708)  General: lying in bed, NAD Neuro: Opens eyes to repeated tactile and verbal stimulation, did not answer any orientation questions, did not follow commands, cranial nerves appear grossly intact, antigravity strength in all 4 extremities  Basic Metabolic Panel: Recent Labs  Lab 09/20/23 1143 09/21/23 0954 09/22/23 0200 09/22/23 1923 09/23/23 0538 09/23/23 2005 09/24/23 1141  NA 137 137 135  --  141  --  140  K 3.1* 3.5 5.1  --  4.4  --  4.5  CL 97* 96* 96*  --  102  --  100  CO2 28 30 25   --  30  --  30  GLUCOSE 145* 227* 276*  --  130*  --  135*  BUN 17 15 18   --  12  --  9  CREATININE 1.53* 1.36* 1.38*  --  0.92  --  1.05*  CALCIUM 7.0* 7.7* 7.4*  --  7.8*  --  8.7*  MG  --   --   --  1.5*  --  2.7*  --     CBC: Recent Labs  Lab 09/20/23 1143 09/21/23 0954 09/22/23 0200 09/23/23 0538 09/24/23 1141  WBC 5.6 9.6 7.2 8.1 8.1  NEUTROABS 3.1  --   --   --   --   HGB 11.4* 12.1 10.5* 10.3* 11.9*  HCT 35.9* 37.5 32.5* 31.1* 36.8  MCV 90.9 90.4 88.1 87.9 88.5  PLT 171 206 172 177 197     Coagulation Studies: No results for input(s): "LABPROT", "INR" in the last 72 hours.  Imaging personally reviewed  CT head without contrast 09/21/2023:Negative non contrasted CT appearance of the brain.   CT without contrast 09/22/2023: normal   ASSESSMENT AND PLAN: 58 year old female with history of epilepsy who presented to [facility] on 09/21/2023 due to worsening shortness of breath in  the setting of influenza A.  While at [facility] patient had seizure-like activity and therefore transferred to [facility] for LTM.  Patient was hooked up to video EEG which showed generalized slowing, no seizures were recorded.    Epilepsy Breakthrough seizures Acute encephalopathy -EEG did not show any evidence of epileptogenicity.  Patient: The electrodes.  Recommendations -DC LTM EEG as patient continues to keep pulling the electrodes -Will obtain MRI brain with and without contrast to look for any acute abnormality -Will reduce Keppra to 500 mg twice daily to minimize sedation -Continue Vimpat 200 mg twice daily for now -Reduce gabapentin to 200 mg 3 times daily to minimize sedation -Minimize sedating medications as much as possible -Continue seizure precautions -Discussed plan with Dr. [place] via secure chat -Discussed plan with husband at bedside  I have spent a total of  38  minutes with the patient reviewing hospital notes,  test results, labs and examining the patient as well as establishing an assessment and plan.  > 50% of time was spent in direct patient care.         Lindie Spruce  Epilepsy [facility] For questions after 5pm please refer to AMION to reach the Neurologist on call

## 2023-09-25 NOTE — Consult Note (Signed)
[facility] Liaison Consult Note    09/25/2023  Brittany Nguyen 1966-05-29 [id number]  Insurance: [employer]   Primary Care Provider: Jerl Mina, MD, [facility], this provider is listed for the transition of care follow up appointments.     RN Hospital Liaison screened the patient remotely at [facility]. Came to rounds, patient is on Droplet Precaution, husband leaving and patient sleeping.  PPE preserved for unit staff.    The patient was screened for 7 day readmission hospitalization with noted extreme risk score for unplanned readmission risk 7 ED visits and 2 hospital admissions in 6 months.  The patient was assessed for potential Community Care Coordination service needs for post hospital transition for care coordination.    Plan: [facility] Liaison will continue to follow progress and disposition to asess for post hospital community care coordination needs.  Referral request for community care coordination: to be determined as follow up ongoing medical management   VBCI Community Care, Population Health does not replace or interfere with any arrangements made by the Inpatient Transition of Care team.   For questions contact:   Charlesetta Shanks, RN, BSN, CCM [place]  [facility], [facility] Liaison Direct Dial: [phone] or secure chat Email: [email or web address]

## 2023-09-25 NOTE — Progress Notes (Signed)
   09/25/23 2130  BiPAP/CPAP/SIPAP  $ Non-Invasive Home Ventilator  Subsequent  BiPAP/CPAP/SIPAP Pt Type Adult  BiPAP/CPAP/SIPAP Resmed  Mask Type Full face mask  Mask Size Medium  EPAP 8 cmH2O  FiO2 (%) 21 %

## 2023-09-25 NOTE — Progress Notes (Signed)
PROGRESS NOTE    Brittany Nguyen  UVO:[id number] DOB: May 15, 1966 DOA: 09/23/2023 PCP: Jerl Mina, MD    Brief Narrative:   Brittany Nguyen is a 58 y.o. 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  History is obtained from review of records as she is currently unable to provide history as she appears to be postictal and has perseveration what ever is said to her.  Patient had been reportedly taking Tamiflu intermittently.  Respiratory status reportedly declined despite using home inhalers.  In the ER was noted to be afebrile stable vital signs satting well on room air.  Labs from 1/16 significant for CBC within normal limits, BUN 15, creatinine 1.3 6,  glucose 227,  AST 49, and lactic acid 1.9-> 3.1.  Patient was noted to have had a seizure evening of 1/16.  Neurology had been consulted after second seizure on 9/17 where rapid response was called.  Repeat lactic acid was noted to trend up on 1/17 up to 6.2.  Patient had a third seizure on the evening of 1/17 for which continuous EEG monitoring was recommended and transferred to [facility].  After arrival here patient was reported to be responsive for approximately 20 minutes prior to coming to.   Assessment & Plan:   Seizure disorder with concern for breakthrough Patient reported to have had 3 seizures since admission on 1/16.  CT scan of the head no acute abnormality.  She had been evaluated by neurology and loaded with Keppra IV.  No seizure or epileptiform discharges noted on EEG. -- Neurology following, appreciate assistance -- Keppra 1g IV q12h -- Vimpat  200mg  IV q12h -- MRI brain with and without contrast: Pending -- Reduced dose of methadone per neurology request -- Seizure precautions -- Await further neurology recommendations   COPD Influenza A Patient diagnosed with influenza A on 1/15.  Chest x-ray otherwise noted to be clear.  Completed course of Tamiflu. -- Prednisone 40 mg p.o. daily x 5 days -- Albuterol neb q6h PRN wheezing/shortness of breath -- Droplet precautions   Chronic systolic and diastolic congestive heart failure, compensated TTE with last EF noted to be 45% with grade 1 diastolic dysfunction back in 01/2018. -- Strict I&O's and daily weights   Essential hypertension -- BP 117/82 -- Not on antihypertensives outpatient   Diabetes mellitus type 2 without complication Hemoglobin A1c 6.4 on 08/21/2023. -- sensitive SSI for coverage -- CBGs qAC/HS   Normocytic anemia, chronic Hemoglobin 10.3, stable   Hypothyroidism TSH 21.828, free T40.46.  On levothyroxine 175 mcg p.o. daily outpatient.  Concern for possible noncompliance. -- Continue levothyroxine 175 mcg PO daily   Depression and anxiety -- Continue Lexapro 10 mg p.o. daily   S/p AICD Placed at [facility] 03/29/2017.  Lead Icd  Sgl Coil Tripolar Active Fix Df-4 Willadean Carol - [id number] - Implanted   Inventory item: Lead Icd Sgl Coil Tripolar Active Fix Df-4 Conn Sprin Model/Cat number: [id number] Serial number: [id number] Manufacturer: [employer] [place] Device identifier: [id number] Device identifier type: GS1 GUDID Information   Request status Successful   Brand name: Sprint Quattro Secure S MRIT SureScanT Version/Model: 7322G25  ICD Device  [name] - [id number] - Implanted   Inventory item: Device [name] Model/Cat number: [id number] Serial number: [id number] [facility] Manufacturer: [employer] [place] Device identifier: [id number] Device identifier type: GS1 GUDID Information   Request status Successful    Brand name: Evera MRIT XT DR SureScan Version/Model: [id number] Company name: [employer] MRI safety info as of 03/29/17: MR Conditional Contains dry or latex rubber: No   GMDN P.T. name: Dual-chamber implantable defibrillator    Company name: [employer] MRI safety info as of 03/29/17: MR Conditional   Chronic pain syndrome -- Gabapentin 300 mg PO TID -- Methadone decreased to 5mg  BID and 5 mg p.o. nightly (home dose 7.5mg  BID and 10mg  PO at bedtime)  RLS: --Requip 4 mg p.o. nightly   OSA on CPAP -- Continue nocturnal CPAP   DVT prophylaxis: enoxaparin (LOVENOX) injection 40 mg Start: 09/23/23 1830    Code Status: Full Code Family Communication: No family present at bedside this morning.  Disposition Plan:  Level of care: Progressive Status is: Inpatient Remains inpatient appropriate because: Continuous EEG, MR brain pending; awaiting further neurology recommendations    Consultants:  Neurology  Procedures:  Continuous EEG  Antimicrobials:  None   Subjective: Patient seen examined bedside, sleeping but arousable.  Responds slowly to questions, appropriate to commands but falls back asleep quickly.  No family present at bedside.  When asked about her medications, specifically levothyroxine/Synthroid she reports does not recognize this medication, concern for possible noncompliance outpatient given abnormal TFTs.  Patient pending MRI brain this morning.  Await further recommendations from nephrology.  Titrated down methadone dose.  Has completed Tamiflu.  Patient denies headache, no dizziness, no chest pain, no shortness of breath, no abdominal pain.  No acute events overnight per nursing staff.   Objective: Vitals:   09/24/23 2335 09/25/23 0346 09/25/23 0708 09/25/23 0841  BP: 121/73 117/82  115/81  Pulse: (!) 59 (!) 59  65  Resp: 18 18  14   Temp: 98 F (36.7 C) 98.4 F (36.9 C)  98.3 F (36.8 C)  TempSrc: Oral Oral  Oral  SpO2: 94% 91%  99%  Weight:    100.4 kg     Intake/Output Summary (Last 24 hours) at 09/25/2023 1017 Last data filed at 09/24/2023 2010 Gross per 24 hour  Intake 505 ml  Output 301 ml  Net 204 ml   Filed Weights   09/25/23 0708  Weight: 100.4 kg    Examination:  Physical Exam: GEN: NAD, alert, oriented to place  (Hospital/[place]); but no answer to date/person and not oriented to situation, ill in appearance HEENT: NCAT, PERRL, EOMI, sclera clear, dry mucous membranes PULM: CTAB w/o wheezes/crackles, normal respiratory effort, on room air CV: RRR w/o M/G/R GI: abd soft, NTND, + BS MSK: no peripheral edema, all extremities independently NEURO: CN II-XII intact, no focal deficits, sensation to light touch intact PSYCH: Depressed mood, flat affect Integumentary: No concerning rashes/lesions/wounds noted on exposed skin surfaces    Data Reviewed: I have personally reviewed following labs and imaging studies  CBC: Recent Labs  Lab 09/20/23 1143 09/21/23 0954 09/22/23 0200 09/23/23 0538 09/24/23 1141  WBC 5.6 9.6 7.2 8.1 8.1  NEUTROABS 3.1  --   --   --   --   HGB 11.4* 12.1 10.5* 10.3* 11.9*  HCT 35.9* 37.5 32.5* 31.1* 36.8  MCV 90.9 90.4 88.1 87.9 88.5  PLT 171 206 172 177 197   Basic Metabolic Panel:  Recent Labs  Lab 09/20/23 1143 09/21/23 0954 09/22/23 0200 09/22/23 1923 09/23/23 0538 09/23/23 2005 09/24/23 1141  NA 137 137 135  --  141  --  140  K 3.1* 3.5 5.1  --  4.4  --  4.5  CL 97* 96* 96*  --  102  --  100  CO2 28 30 25   --  30  --  30  GLUCOSE 145* 227* 276*  --  130*  --  135*  BUN 17 15 18   --  12  --  9  CREATININE 1.53* 1.36* 1.38*  --  0.92  --  1.05*  CALCIUM 7.0* 7.7* 7.4*  --  7.8*  --  8.7*  MG  --   --   --  1.5*  --  2.7*  --    GFR: Estimated Creatinine Clearance: 71.1 mL/min (A) (by C-G formula based on SCr of 1.05 mg/dL (H)). Liver Function Tests: Recent Labs  Lab 09/20/23 1143 09/21/23 1157 09/22/23 0200  AST 62* 49* 56*  ALT 30 28 28   ALKPHOS 75 84  72  BILITOT 0.5 0.4 0.5  PROT 7.0 7.5 6.6  ALBUMIN 3.6 3.8 3.0*  2.9*   No results for input(s): "LIPASE", "AMYLASE" in the last 168 hours. No results for input(s): "AMMONIA" in the last 168 hours. Coagulation Profile: No results for input(s): "INR", "PROTIME" in the last 168 hours. Cardiac Enzymes: No results for input(s): "CKTOTAL", "CKMB", "CKMBINDEX", "TROPONINI" in the last 168 hours. BNP (last 3 results) No results for input(s): "PROBNP" in the last 8760 hours. HbA1C: No results for input(s): "HGBA1C" in the last 72 hours. CBG: Recent Labs  Lab 09/24/23 0610 09/24/23 1149 09/24/23 1650 09/24/23 2117 09/25/23 0627  GLUCAP 126* 145* 149* 176* 107*   Lipid Profile: No results for input(s): "CHOL", "HDL", "LDLCALC", "TRIG", "CHOLHDL", "LDLDIRECT" in the last 72 hours. Thyroid Function Tests: Recent Labs    09/23/23 2005 09/24/23 1141  TSH 21.828*  --   FREET4  --  0.46*   Anemia Panel: No results for input(s): "VITAMINB12", "FOLATE", "FERRITIN", "TIBC", "IRON", "RETICCTPCT" in the last 72 hours. Sepsis Labs: Recent Labs  Lab 09/22/23 0200 09/22/23 1152 09/22/23 1804 09/24/23 1141  LATICACIDVEN 6.8* 4.4* 6.2* 2.2*    Recent Results (from the past 240 hours)  Resp panel by RT-PCR (RSV, Flu A&B, Covid) Anterior Nasal Swab     Status: Abnormal   Collection Time: 09/18/23  6:02 PM   Specimen: Anterior Nasal Swab  Result Value Ref Range Status   SARS Coronavirus 2 by RT PCR NEGATIVE NEGATIVE Final    Comment: (NOTE) SARS-CoV-2 target nucleic acids are NOT DETECTED.  The SARS-CoV-2 RNA is generally detectable in upper respiratory specimens during the acute phase of infection. The lowest concentration of SARS-CoV-2 viral copies this assay can detect is 138 copies/mL. A negative result does not preclude SARS-Cov-2 infection and should not be used as the sole basis for treatment or other patient management decisions. A negative result may occur with  improper  specimen collection/handling, submission of specimen other than nasopharyngeal swab, presence of viral mutation(s) within the areas targeted by this assay, and inadequate number of viral copies(<138 copies/mL). A negative result must be combined with clinical observations, patient history, and epidemiological information. The expected result is Negative.  Fact Sheet for Patients:  BloggerCourse.com  Fact Sheet for Healthcare Providers:  SeriousBroker.it  This test is no t yet approved or cleared by the [place] FDA and  has  been authorized for detection and/or diagnosis of SARS-CoV-2 by FDA under an Emergency Use Authorization (EUA). This EUA will remain  in effect (meaning this test can be used) for the duration of the COVID-19 declaration under Section 564(b)(1) of the Act, 21 U.S.C.section 360bbb-3(b)(1), unless the authorization is terminated  or revoked sooner.       Influenza A by PCR POSITIVE (A) NEGATIVE Final   Influenza B by PCR NEGATIVE NEGATIVE Final    Comment: (NOTE) The Xpert Xpress SARS-CoV-2/FLU/RSV plus assay is intended as an aid in the diagnosis of influenza from Nasopharyngeal swab specimens and should not be used as a sole basis for treatment. Nasal washings and aspirates are unacceptable for Xpert Xpress SARS-CoV-2/FLU/RSV testing.  Fact Sheet for Patients: BloggerCourse.com  Fact Sheet for Healthcare Providers: SeriousBroker.it  This test is not yet approved or cleared by the [place] FDA and has been authorized for detection and/or diagnosis of SARS-CoV-2 by FDA under an Emergency Use Authorization (EUA). This EUA will remain in effect (meaning this test can be used) for the duration of the COVID-19 declaration under Section 564(b)(1) of the Act, 21 U.S.C. section 360bbb-3(b)(1), unless the authorization is terminated or revoked.     Resp  Syncytial Virus by PCR NEGATIVE NEGATIVE Final    Comment: (NOTE) Fact Sheet for Patients: BloggerCourse.com  Fact Sheet for Healthcare Providers: SeriousBroker.it  This test is not yet approved or cleared by the [place] FDA and has been authorized for detection and/or diagnosis of SARS-CoV-2 by FDA under an Emergency Use Authorization (EUA). This EUA will remain in effect (meaning this test can be used) for the duration of the COVID-19 declaration under Section 564(b)(1) of the Act, 21 U.S.C. section 360bbb-3(b)(1), unless the authorization is terminated or revoked.  Performed at [facility], [address] [place], [place] [address]   Culture, blood (routine x 2)     Status: None (Preliminary result)   Collection Time: 09/20/23 11:41 AM   Specimen: BLOOD  Result Value Ref Range Status   Specimen Description BLOOD BLOOD RIGHT ARM  Final   Special Requests   Final    BOTTLES DRAWN AEROBIC AND ANAEROBIC Blood Culture adequate volume   Culture   Final    NO GROWTH 4 DAYS Performed at [facility], [address], [place], [place] [address]    Report Status PENDING  Incomplete  MRSA Next Gen by PCR, Nasal     Status: None   Collection Time: 09/22/23  7:19 PM   Specimen: Nasal Mucosa; Nasal Swab  Result Value Ref Range Status   MRSA by PCR Next Gen NOT DETECTED NOT DETECTED Final    Comment: (NOTE) The GeneXpert MRSA Assay (FDA approved for NASAL specimens only), is one component of a comprehensive MRSA colonization surveillance program. It is not intended to diagnose MRSA infection nor to guide or monitor treatment for MRSA infections. Test performance is not FDA approved in patients less than 28 years old. Performed at [facility], [address] [place], [place] [address]          Radiology Studies: Overnight EEG with video Result Date: 09/24/2023 Charlsie Quest, MD      09/25/2023  9:35 AM Patient Name: Brittany Nguyen MRN: [id number] Epilepsy Attending: Charlsie Quest Referring Physician/Provider: Rejeana Brock, MD Duration: 09/23/2023 1810 to 09/25/2023 1401 Patient history: 58yo F with seizure like activity getting eeg to evaluate for seizure Level of alertness: Awake/ lethargic AEDs during EEG  study: LEV, LCM, GBP Technical aspects: This EEG study was done with scalp electrodes positioned according to the 10-20 International system of electrode placement. Electrical activity was reviewed with band pass filter of 1-70Hz , sensitivity of 7 uV/mm, display speed of 86mm/sec with a 60Hz  notched filter applied as appropriate. EEG data were recorded continuously and digitally stored.  Video monitoring was available and reviewed as appropriate. Description: EEG showed continuous generalized 3 to 6 Hz theta-delta slowing. Hyperventilation and photic stimulation were not performed.  EEG was difficult to interpret between 09/23/2023 2105 to 09/24/2023 0024 and on  09/24/2023 between 0316 to 0931 as patient pulled all electrodes. ABNORMALITY - Continuous slow, generalized IMPRESSION: This study is suggestive of moderate diffuse encephalopathy. No seizures or epileptiform discharges were seen throughout the recording. Priyanka Annabelle Harman        Scheduled Meds:  enoxaparin (LOVENOX) injection  40 mg Subcutaneous Q24H   escitalopram  10 mg Oral Daily   gabapentin  300 mg Oral TID   insulin aspart  0-5 Units Subcutaneous QHS   insulin aspart  0-9 Units Subcutaneous TID WC   levothyroxine  175 mcg Oral Q0600   methadone  5 mg Oral QHS   methadone  5 mg Oral BID   predniSONE  40 mg Oral Q breakfast   rOPINIRole  4 mg Oral QHS   sodium chloride flush  3 mL Intravenous Q12H   Continuous Infusions:  lacosamide (VIMPAT) 200 mg in sodium chloride 0.9 % 25 mL IVPB 200 mg (09/25/23 1001)   levETIRAcetam Stopped (09/25/23 1000)     LOS: 2 days    Time spent: 52 minutes  spent on chart review, discussion with nursing staff, consultants, updating family and interview/physical exam; more than 50% of that time was spent in counseling and/or coordination of care.    Alvira Philips [place], DO [facility] Available via Epic secure chat 7am-7pm After these hours, please refer to coverage provider listed on [email or web address] 09/25/2023, 10:17 AM

## 2023-09-25 NOTE — Progress Notes (Signed)
LTM EEG disconnected - no skin breakdown at unhook.  

## 2023-09-26 ENCOUNTER — Other Ambulatory Visit: Payer: Self-pay

## 2023-09-26 ENCOUNTER — Encounter (HOSPITAL_COMMUNITY): Payer: Self-pay | Admitting: Internal Medicine

## 2023-09-26 DIAGNOSIS — G40909 Epilepsy, unspecified, not intractable, without status epilepticus: Secondary | ICD-10-CM | POA: Diagnosis not present

## 2023-09-26 LAB — GLUCOSE, CAPILLARY
Glucose-Capillary: 84 mg/dL (ref 70–99)
Glucose-Capillary: 234 mg/dL — ABNORMAL HIGH (ref 70–99)
Glucose-Capillary: 153 mg/dL — ABNORMAL HIGH (ref 70–99)
Glucose-Capillary: 126 mg/dL — ABNORMAL HIGH (ref 70–99)

## 2023-09-26 LAB — CBC
HCT: 34.6 % — ABNORMAL LOW (ref 36.0–46.0)
Hemoglobin: 11.2 g/dL — ABNORMAL LOW (ref 12.0–15.0)
MCH: 29.2 pg (ref 26.0–34.0)
MCHC: 32.4 g/dL (ref 30.0–36.0)
MCV: 90.3 fL (ref 80.0–100.0)
Platelets: 179 10*3/uL (ref 150–400)
RBC: 3.83 MIL/uL — ABNORMAL LOW (ref 3.87–5.11)
RDW: 15.4 % (ref 11.5–15.5)
WBC: 7.6 10*3/uL (ref 4.0–10.5)
nRBC: 0 % (ref 0.0–0.2)

## 2023-09-26 LAB — COMPREHENSIVE METABOLIC PANEL
ALT: 68 U/L — ABNORMAL HIGH (ref 0–44)
AST: 44 U/L — ABNORMAL HIGH (ref 15–41)
Albumin: 2.7 g/dL — ABNORMAL LOW (ref 3.5–5.0)
Alkaline Phosphatase: 63 U/L (ref 38–126)
Anion gap: 9 (ref 5–15)
BUN: 15 mg/dL (ref 6–20)
CO2: 29 mmol/L (ref 22–32)
Calcium: 7.8 mg/dL — ABNORMAL LOW (ref 8.9–10.3)
Chloride: 100 mmol/L (ref 98–111)
Creatinine, Ser: 1.11 mg/dL — ABNORMAL HIGH (ref 0.44–1.00)
GFR, Estimated: 59 mL/min — ABNORMAL LOW (ref >60)
Glucose, Bld: 107 mg/dL — ABNORMAL HIGH (ref 70–99)
Potassium: 4.2 mmol/L (ref 3.5–5.1)
Sodium: 138 mmol/L (ref 135–145)
Total Bilirubin: 0.5 mg/dL (ref 0.0–1.2)
Total Protein: 5.8 g/dL — ABNORMAL LOW (ref 6.5–8.1)

## 2023-09-26 LAB — MAGNESIUM: Magnesium: 2.4 mg/dL (ref 1.7–2.4)

## 2023-09-26 LAB — LACTIC ACID, PLASMA: Lactic Acid, Venous: 1.2 mmol/L (ref 0.5–1.9)

## 2023-09-26 LAB — PHOSPHORUS: Phosphorus: 4.7 mg/dL — ABNORMAL HIGH (ref 2.5–4.6)

## 2023-09-26 MED ORDER — LACOSAMIDE 200 MG PO TABS
200.0000 mg | ORAL_TABLET | Freq: Two times a day (BID) | ORAL | Status: DC
  Administered 2023-09-26 – 2023-09-27 (×2): 200 mg via ORAL
  Filled 2023-09-26 (×2): qty 1

## 2023-09-26 MED ORDER — SODIUM CHLORIDE 0.9 % IV SOLN
200.0000 mg | Freq: Once | INTRAVENOUS | Status: AC
  Administered 2023-09-26: 200 mg via INTRAVENOUS
  Filled 2023-09-26: qty 20

## 2023-09-26 NOTE — Evaluation (Addendum)
Physical Therapy Evaluation Patient Details Name: Brittany Nguyen MRN: [id number] DOB: 01-24-1966 Today's Date: 09/26/2023  History of Present Illness  58 y.o. female who presented initially at [facility] on 1/16 due to complaints of progressively worsening shortness of breath and found to be positive for influenza A. +seizure x3 with transfer to [facility]; CT head no acute changes;  MRI no acute changes; PMH significant of hypertension, CAD, CHF last EF 45% s/p AICD,  diabetes mellitus type 2, COPD/asthma, hypothyroidism, depression, DVT status post IVC filter, stomach cancer, seizure disorder, and chronic pain  Clinical Impression   Pt admitted secondary to problem above with deficits below. PTA patient was walking independently with intermittent use of cane (when knee hurting). She lives with husband in one level house with 8 steps and bil rails to enter.  Pt currently requires CGA for bed mobility with reports of dizziness with supine to sit and sit to stand. +orthostasis at 1 minute standing with partial recovery of BP at 3 minutes (see vitals flowsheet).  Anticipate patient will benefit from PT to address problems listed below. Will continue to follow acutely to maximize functional mobility independence and safety. Anticipate good progress with no need for followup PT on discharge.          If plan is discharge home, recommend the following: A little help with walking and/or transfers;Assistance with cooking/housework;Direct supervision/assist for medications management;Direct supervision/assist for financial management;Assist for transportation;Help with stairs or ramp for entrance;Supervision due to cognitive status   Can travel by private vehicle        Equipment Recommendations None recommended by PT  Recommendations for Other Services  OT consult    Functional Status Assessment Patient has had a recent decline in their functional status and demonstrates the ability to make significant  improvements in function in a reasonable and predictable amount of time.     Precautions / Restrictions Precautions Precautions: Fall;Other (comment) Precaution Comments: seizure      Mobility  Bed Mobility Overal bed mobility: Needs Assistance Bed Mobility: Supine to Sit     Supine to sit: Contact guard     General bed mobility comments: due to rekports of dizziness    Transfers Overall transfer level: Needs assistance Equipment used: Rolling walker (2 wheels) Transfers: Sit to/from Stand Sit to Stand: Contact guard assist           General transfer comment: due to dizziness; vc for hand placement    Ambulation/Gait Ambulation/Gait assistance: Contact guard assist Gait Distance (Feet): 40 Feet Assistive device: Rolling walker (2 wheels) Gait Pattern/deviations: Step-through pattern, Decreased stride length   Gait velocity interpretation: 1.31 - 2.62 ft/sec, indicative of limited community ambulator   General Gait Details: difficulty negotiating RW in tight spots and tends to lift walker over obstacles  Stairs            Wheelchair Mobility     Tilt Bed    Modified Rankin (Stroke Patients Only)       Balance Overall balance assessment: Mild deficits observed, not formally tested                                           Pertinent Vitals/Pain Pain Assessment Pain Assessment: 0-10 Pain Score: 10-Worst pain ever Pain Location: abd Pain Descriptors / Indicators: Stabbing Pain Intervention(s): Limited activity within patient's tolerance, Monitored during session    Home Living  Family/patient expects to be discharged to:: Private residence Living Arrangements: Spouse/significant other Available Help at Discharge: Family;Available 24 hours/day Type of Home: Mobile home Home Access: Stairs to enter Entrance Stairs-Rails: Can reach both Entrance Stairs-Number of Steps: 8   Home Layout: One level Home Equipment: Grab bars -  tub/shower;Shower seat - built in;Cane - single Librarian, academic (2 wheels)      Prior Function Prior Level of Function : Independent/Modified Independent;Driving             Mobility Comments: uses a cane intermittently due to knee pain ADLs Comments: independent with meds and takes care of most of finances     Extremity/Trunk Assessment   Upper Extremity Assessment Upper Extremity Assessment: Defer to OT evaluation    Lower Extremity Assessment Lower Extremity Assessment: Generalized weakness    Cervical / Trunk Assessment Cervical / Trunk Assessment: Other exceptions Cervical / Trunk Exceptions: overweight  Communication   Communication Communication: Difficulty communicating thoughts/reduced clarity of speech (?word finding vs confusion) Cueing Techniques: Verbal cues  Cognition Arousal: Alert Behavior During Therapy: Flat affect Overall Cognitive Status: Impaired/Different from baseline Area of Impairment: Orientation, Attention, Memory, Following commands, Safety/judgement, Problem solving                 Orientation Level: Disoriented to, Time Current Attention Level: Sustained Memory: Decreased short-term memory Following Commands: Follows one step commands with increased time Safety/Judgement: Decreased awareness of deficits   Problem Solving: Slow processing, Decreased initiation, Requires verbal cues General Comments: did not know month and could not figure out from cues given; could not recall month after 15 minutes        General Comments General comments (skin integrity, edema, etc.): Husband present. BP dropped with sit to stand and recovered partially while ambulating    Exercises     Assessment/Plan    PT Assessment Patient needs continued PT services  PT Problem List Decreased strength;Decreased balance;Decreased mobility;Decreased cognition;Decreased knowledge of use of DME;Decreased safety awareness;Decreased knowledge of  precautions;Cardiopulmonary status limiting activity;Obesity       PT Treatment Interventions DME instruction;Gait training;Stair training;Functional mobility training;Therapeutic activities;Therapeutic exercise;Balance training;Cognitive remediation;Patient/family education    PT Goals (Current goals can be found in the Care Plan section)  Acute Rehab PT Goals Patient Stated Goal: return to using cane intermittently PT Goal Formulation: With patient Time For Goal Achievement: 10/10/23 Potential to Achieve Goals: Good    Frequency Min 1X/week     Co-evaluation               AM-PAC PT "6 Clicks" Mobility  Outcome Measure Help needed turning from your back to your side while in a flat bed without using bedrails?: None Help needed moving from lying on your back to sitting on the side of a flat bed without using bedrails?: A Little Help needed moving to and from a bed to a chair (including a wheelchair)?: A Little Help needed standing up from a chair using your arms (e.g., wheelchair or bedside chair)?: A Little Help needed to walk in hospital room?: A Little Help needed climbing 3-5 steps with a railing? : A Little 6 Click Score: 19    End of Session Equipment Utilized During Treatment: Gait belt Activity Tolerance: Patient tolerated treatment well Patient left: in chair;with call bell/phone within reach;with bed alarm set;with family/visitor present Nurse Communication: Mobility status;Other (comment) (BP decr) PT Visit Diagnosis: Unsteadiness on feet (R26.81);Other abnormalities of gait and mobility (R26.89);Muscle weakness (generalized) (M62.81)    Time: 5188-4166  PT Time Calculation (min) (ACUTE ONLY): 38 min   Charges:   PT Evaluation $PT Eval Moderate Complexity: 1 Mod PT Treatments $Gait Training: 8-22 mins PT General Charges $$ ACUTE PT VISIT: 1 Visit          Jerolyn Center, PT Acute Rehabilitation Services  Office [phone]   Zena Amos 09/26/2023,  1:41 PM

## 2023-09-26 NOTE — Progress Notes (Signed)
Subjective: No acute events overnight.  More awake today.  States she is feeling better.  ROS: negative except above  Examination  Vital signs in last 24 hours: Temp:  [98 F (36.7 C)-98.5 F (36.9 C)] 98.5 F (36.9 C) (01/21 1132) Pulse Rate:  [60-68] 66 (01/21 1132) Resp:  [12-14] 14 (01/21 1132) BP: (126-142)/(81-94) 126/81 (01/21 1132) SpO2:  [90 %-100 %] 90 % (01/21 1132) FiO2 (%):  [21 %] 21 % (01/20 2130) Weight:  [100.4 kg] 100.4 kg (01/21 1216)  General: lying in bed, NAD Neuro: MS: Alert, oriented, follows commands, able to do simple math but unable to do serial sevens CN: pupils equal and reactive,  EOMI, face symmetric, tongue midline, normal sensation over face, Motor: 5/5 strength in all 4 extremities Coordination: normal Gait: not tested  Basic Metabolic Panel: Recent Labs  Lab 09/21/23 0954 09/22/23 0200 09/22/23 1923 09/23/23 0538 09/23/23 2005 09/24/23 1141 09/26/23 0522  NA 137 135  --  141  --  140 138  K 3.5 5.1  --  4.4  --  4.5 4.2  CL 96* 96*  --  102  --  100 100  CO2 30 25  --  30  --  30 29  GLUCOSE 227* 276*  --  130*  --  135* 107*  BUN 15 18  --  12  --  9 15  CREATININE 1.36* 1.38*  --  0.92  --  1.05* 1.11*  CALCIUM 7.7* 7.4*  --  7.8*  --  8.7* 7.8*  MG  --   --  1.5*  --  2.7*  --  2.4  PHOS  --   --   --   --   --   --  4.7*    CBC: Recent Labs  Lab 09/20/23 1143 09/21/23 0954 09/22/23 0200 09/23/23 0538 09/24/23 1141 09/26/23 0522  WBC 5.6 9.6 7.2 8.1 8.1 7.6  NEUTROABS 3.1  --   --   --   --   --   HGB 11.4* 12.1 10.5* 10.3* 11.9* 11.2*  HCT 35.9* 37.5 32.5* 31.1* 36.8 34.6*  MCV 90.9 90.4 88.1 87.9 88.5 90.3  PLT 171 206 172 177 197 179     Coagulation Studies: No results for input(s): "LABPROT", "INR" in the last 72 hours.  Imaging personally reviewed  MRI brain without contrast 09/25/2023: No acute abnormality.  ASSESSMENT AND PLAN:  58 year old female with history of epilepsy who presented to [facility] on  09/21/2023 due to worsening shortness of breath in the setting of influenza A.  While at [facility] patient had seizure-like activity and therefore transferred to [facility] for LTM.  Patient was hooked up to video EEG which showed generalized slowing, no seizures were recorded.     Epilepsy Breakthrough seizures Acute encephalopathy -EEG did not show any evidence of epileptogenicity.   -Likely had breakthrough seizures due to infection   Recommendations -DC Keppra as no further seizures -Continue Vimpat 200 mg twice daily for now, switch to p.o. -Reduced gabapentin to 200 mg 3 times daily to minimize sedation.  Home dose can be resumed as long as patient continues to remain AOx3 -Continue seizure precautions -Discussed plan with Dr. [place] via secure chat -Discussed plan with husband at bedside -Follow-up with Dr. Sherryll Burger at [facility] neurology clinic in 2 to 3 months  Seizure precautions: Per [facility] statutes, patients with seizures are not allowed to drive until they have been seizure-free for six months and cleared  by a physician    Use caution when using heavy equipment or power tools. Avoid working on ladders or at heights. Take showers instead of baths. Ensure the water temperature is not too high on the home water heater. Do not go swimming alone. Do not lock yourself in a room alone (i.e. bathroom). When caring for infants or small children, sit down when holding, feeding, or changing them to minimize risk of injury to the child in the event you have a seizure. Maintain good sleep hygiene. Avoid alcohol.    If patient has another seizure, call 911 and bring them back to the ED if: A.  The seizure lasts longer than 5 minutes.      B.  The patient doesn't wake shortly after the seizure or has new problems such as difficulty seeing, speaking or moving following the seizure C.  The patient was injured during the seizure D.  The patient has a temperature over 102 F (39C) E.   The patient vomited during the seizure and now is having trouble breathing    During the Seizure   - First, ensure adequate ventilation and place patients on the floor on their left side  Loosen clothing around the neck and ensure the airway is patent. If the patient is clenching the teeth, do not force the mouth open with any object as this can cause severe damage - Remove all items from the surrounding that can be hazardous. The patient may be oblivious to what's happening and may not even know what he or she is doing. If the patient is confused and wandering, either gently guide him/her away and block access to outside areas - Reassure the individual and be comforting - Call 911. In most cases, the seizure ends before EMS arrives. However, there are cases when seizures may last over 3 to 5 minutes. Or the individual may have developed breathing difficulties or severe injuries. If a pregnant patient or a person with diabetes develops a seizure, it is prudent to call an ambulance.     After the Seizure (Postictal Stage)   After a seizure, most patients experience confusion, fatigue, muscle pain and/or a headache. Thus, one should permit the individual to sleep. For the next few days, reassurance is essential. Being calm and helping reorient the person is also of importance.   Most seizures are painless and end spontaneously. Seizures are not harmful to others but can lead to complications such as stress on the lungs, brain and the heart. Individuals with prior lung problems may develop labored breathing and respiratory distress.     I have spent a total of  36 minutes with the patient reviewing hospital notes,  test results, labs and examining the patient as well as establishing an assessment and plan.  > 50% of time was spent in direct patient care.    Lindie Spruce Epilepsy [facility] For questions after 5pm please refer to AMION to reach the Neurologist on call

## 2023-09-26 NOTE — TOC Initial Note (Signed)
Transition of Care [facility]) - Initial/Assessment Note    Patient Details  Name: Brittany Nguyen MRN: [id number] Date of Birth: [birth year]  Transition of Care [facility]) CM/SW Contact:    Kermit Balo, RN Phone Number: 09/26/2023, 3:46 PM  Clinical Narrative:                  Pt is from home with her spouse. He is retired and able to be with her most of the time.  Pt denies issues with medications at home.  Spouse can provide needed transportation.  No follow up per PT. Awaiting final recs from OT. TOC following.   Expected Discharge Plan: Home/Self Care Barriers to Discharge: Continued Medical Work up   Patient Goals and CMS Choice            Expected Discharge Plan and Services   Discharge Planning Services: CM Consult   Living arrangements for the past 2 months: Single Family Home                                      Prior Living Arrangements/Services Living arrangements for the past 2 months: Single Family Home Lives with:: Spouse Patient language and need for interpreter reviewed:: Yes Do you feel safe going back to the place where you live?: Yes        Care giver support system in place?: Yes (comment) Current home services: DME (cane/ walker/ wheelchair/ shower seat/ CPAP/ oxygen) Criminal Activity/Legal Involvement Pertinent to Current Situation/Hospitalization: No - Comment as needed  Activities of Daily Living   ADL Screening (condition at time of admission) Independently performs ADLs?: No Does the patient have a NEW difficulty with bathing/dressing/toileting/self-feeding that is expected to last >3 days?: No Does the patient have a NEW difficulty with getting in/out of bed, walking, or climbing stairs that is expected to last >3 days?: No Does the patient have a NEW difficulty with communication that is expected to last >3 days?: No Is the patient deaf or have difficulty hearing?: No Does the patient have difficulty seeing, even when wearing  glasses/contacts?: No Does the patient have difficulty concentrating, remembering, or making decisions?: No  Permission Sought/Granted                  Emotional Assessment Appearance:: Appears stated age Attitude/Demeanor/Rapport: Engaged Affect (typically observed): Accepting Orientation: : Oriented to Self, Oriented to Place, Oriented to  Time, Oriented to Situation   Psych Involvement: No (comment)  Admission diagnosis:  Chronic obstructive pulmonary disease with (acute) exacerbation [J44.1] Influenza due to unidentified influenza virus with other respiratory manifestations [J11.1] Recurrent seizures (HCC) [G40.909] Patient Active Problem List   Diagnosis Date Noted   Recurrent seizures (HCC) 09/23/2023   COPD exacerbation (HCC) 09/21/2023   Encephalopathy 11/30/2022   HTN (hypertension) 09/19/2022   CAD (coronary artery disease) 09/19/2022   Depression with anxiety 09/19/2022   Nonproductive cough 09/11/2022   Hypothyroidism 09/11/2022   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   CHF (congestive heart failure) (HCC) 08/17/2022   History of DVT (deep vein thrombosis) 08/17/2022   MRSA (methicillin resistant Staphylococcus aureus) 08/17/2022   Hypocalcemia 07/31/2022   Diarrhea 07/31/2022   Essential hypertension 07/31/2022   Morbid obesity with body mass index (BMI) of 50.0 to 59.9 in adult [facility]) 07/31/2022   Seizures (HCC) 07/30/2022   Breast cancer (HCC) 05/23/2022   Bloody stool 04/22/2022   Seizure (HCC) 03/01/2022  Refractory nausea and vomiting 12/15/2021   Migraine 11/09/2021   Neuropathy 11/09/2021   Anxiety 11/09/2021   At risk for prolonged QT interval syndrome - borderline long QT on EKG 11/07/21 in ED 11/07/2021   Prolonged QT interval 09/27/2021   Cancer related pain 08/10/2021   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   Seizure disorder (HCC) 06/22/2021   Chronic diarrhea 06/22/2021   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   Moderate  protein-calorie malnutrition (HCC) 06/19/2020   Non-ischemic cardiomyopathy (HCC) 01/23/2020   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/23/2020   Breakthrough seizure (HCC) 01/23/2020   Acquired hypothyroidism 01/23/2020   Chronic use of opiate for therapeutic purpose 01/23/2020   Type 2 diabetes mellitus without complication (HCC) 01/23/2020   Current moderate episode of major depressive disorder (HCC) 05/21/2019   Viral syndrome 04/24/2019   DVT (deep venous thrombosis) (HCC) 02/27/2019   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   Coronary artery disease involving native coronary artery of native heart 08/24/2018   Mild intermittent asthma without complication 08/24/2018   Breast mass 08/08/2018   Intractable migraine with aura without status migrainosus 06/20/2018   Osteoarthritis of knee 06/01/2018   Pain in right knee 05/15/2018   Abdominal wall pain in right flank 04/10/2018   Postcholecystectomy diarrhea 04/10/2018   Iron deficiency anemia 03/27/2018   Normocytic anemia 03/26/2018   Unstable angina (HCC)    Encounter for anticoagulation discussion and counseling    Influenza A 10/29/2017   COPD (chronic obstructive pulmonary disease) (HCC) 04/26/2017   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 [place] [address] Phone:  [phone] Fax: [phone]  [facility] [address], [place] - [address] [place] [address] Phone: [phone] Fax: [phone]  [facility] [address], [place] - [address] [place] [address] Phone: [phone] Fax: [phone]     Social Drivers of Health (SDOH) Social History: SDOH Screenings   Food Insecurity: Patient Unable To Answer (09/23/2023)  Housing: Unknown (09/23/2023)  Transportation Needs: Patient Unable To Answer (09/24/2023)  Utilities: Patient Unable To Answer (09/24/2023)  Financial Resource Strain: High Risk (12/07/2020)   Received from [facility], [facility]  Physical Activity: Insufficiently Active (12/07/2020)   Received from [facility], [facility]  Social Connections: Socially Integrated (09/22/2023)  Stress: Stress Concern Present (12/07/2020)   Received from [facility], [facility]  Tobacco Use: Low Risk  (09/26/2023)   SDOH Interventions:     Readmission Risk Interventions     No data to display

## 2023-09-26 NOTE — Progress Notes (Signed)
PROGRESS NOTE    Brittany Nguyen  ZOX:[id number] DOB: September 24, 1965 DOA: 09/23/2023 PCP: Jerl Mina, MD    Brief Narrative:   Brittany Nguyen is a 58 y.o. 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  History is obtained from review of records as she is currently unable to provide history as she appears to be postictal and has perseveration what ever is said to her.  Patient had been reportedly taking Tamiflu intermittently.  Respiratory status reportedly declined despite using home inhalers.  In the ER was noted to be afebrile stable vital signs satting well on room air.  Labs from 1/16 significant for CBC within normal limits, BUN 15, creatinine 1.3 6,  glucose 227,  AST 49, and lactic acid 1.9-> 3.1.  Patient was noted to have had a seizure evening of 1/16.  Neurology had been consulted after second seizure on 9/17 where rapid response was called.  Repeat lactic acid was noted to trend up on 1/17 up to 6.2.  Patient had a third seizure on the evening of 1/17 for which continuous EEG monitoring was recommended and transferred to [facility].  After arrival here patient was reported to be responsive for approximately 20 minutes prior to coming to.   Assessment & Plan:   Seizure disorder with concern for breakthrough Patient reported to have had 3 seizures since admission on 1/16.  CT scan of the head no acute abnormality.  She had been evaluated by neurology and loaded with Keppra IV.  No seizure or epileptiform discharges noted on EEG.  MRI without contrast with no acute findings. -- Neurology following, appreciate  assistance -- Keppra 500 mg IV q12h -- Vimpat 200mg  IV q12h -- Reduced dose of methadone, Keppra, gabapentin per neurology recommendations -- Seizure precautions -- Await further neurology recommendations   COPD Influenza A Patient diagnosed with influenza A on 1/15.  Chest x-ray otherwise noted to be clear.  Completed course of Tamiflu. -- Prednisone 40 mg p.o. daily x 5 days -- Albuterol neb q6h PRN wheezing/shortness of breath -- Droplet precautions   Chronic systolic and diastolic congestive heart failure, compensated TTE with last EF noted to be 45% with grade 1 diastolic dysfunction back in 01/2018. -- Strict I&O's and daily weights   Essential hypertension -- BP 117/82 -- Not on antihypertensives outpatient   Diabetes mellitus type 2 without complication Hemoglobin A1c 6.4 on 08/21/2023. -- sensitive SSI for coverage -- CBGs qAC/HS   Normocytic anemia, chronic Hemoglobin 10.3, stable   Hypothyroidism TSH 21.828, free T40.46.  On levothyroxine 175 mcg p.o. daily outpatient.  Concern for possible noncompliance. -- Continue levothyroxine 175 mcg PO daily   Depression and anxiety -- Continue Lexapro 10 mg p.o. daily   S/p AICD Placed at [facility] 03/29/2017.  Lead Icd  Sgl Coil Tripolar Active Fix Df-4 Brittany Nguyen - [id number] - Implanted   Inventory item: Lead Icd Sgl Coil Tripolar Active Fix Df-4 Conn Sprin Model/Cat number: [id number] Serial number: [id number] Manufacturer: [employer] [place] Device identifier: [id number] Device identifier type: GS1 GUDID Information   Request status Successful   Brand name: Sprint Quattro Secure S MRIT SureScanT Version/Model: 7253G64  ICD Device [name] - [id number] - Implanted   Inventory item: Device [name] Model/Cat number: [id number] Serial number: [id number] [facility] Manufacturer: [employer] [place] Device identifier: [id number] Device identifier type: GS1 GUDID Information    Request status Successful   Brand name: Evera MRIT XT DR SureScan Version/Model: [id number] Company name: [employer] MRI safety info as of 03/29/17: MR Conditional Contains dry or latex rubber: No   GMDN P.T. name: Dual-chamber implantable defibrillator    Company name: [employer] MRI safety info as of 03/29/17: MR Conditional   Chronic pain syndrome -- Gabapentin decreased to 200 mg PO TID (300 mg TID at home) -- Methadone decreased to 5mg  BID and 5 mg p.o. nightly (home dose 7.5mg  BID and 10mg  PO at bedtime)  RLS: -- Requip 4 mg p.o. nightly   OSA on CPAP -- Continue nocturnal CPAP  Weakness/debility/deconditioning/gait disturbance: -- PT/OT evaluation   DVT prophylaxis: enoxaparin (LOVENOX) injection 40 mg Start: 09/23/23 1830    Code Status: Full Code Family Communication: No family present at bedside this morning.  Disposition Plan:  Level of care: Progressive Status is: Inpatient Remains inpatient appropriate because: Await further neurology recommendations    Consultants:  Neurology  Procedures:  Continuous EEG  Antimicrobials:  None   Subjective: Patient seen examined bedside, sleeping but arousable.  Responds slowly to questions, appropriate to commands but falls back asleep quickly.  No family present at bedside.  When asked about her medications, specifically levothyroxine/Synthroid she reports does not recognize this medication, concern for possible noncompliance outpatient given abnormal TFTs.  Patient pending MRI brain this morning.  Await further recommendations from nephrology.  Titrated down methadone dose.  Has completed Tamiflu.  Patient denies headache, no dizziness, no chest pain, no shortness of breath, no abdominal pain.  No acute events overnight per nursing staff.   Objective: Vitals:   09/25/23 2026 09/26/23 0028 09/26/23 0544 09/26/23 0806  BP: 135/84 (!) 142/92 (!) 136/94 136/86  Pulse: 60 65 60 60  Resp:    12  Temp: 98.3 F  (36.8 C) 98.1 F (36.7 C) 98.2 F (36.8 C) 98.4 F (36.9 C)  TempSrc: Oral Oral Oral Oral  SpO2: 96% 98% 100% 90%  Weight:        Intake/Output Summary (Last 24 hours) at 09/26/2023 1049 Last data filed at 09/26/2023 0744 Gross per 24 hour  Intake 254.53 ml  Output --  Net 254.53 ml   Filed Weights   09/25/23 0708  Weight: 100.4 kg    Examination:  Physical Exam: GEN: NAD, alert, oriented to place ([facility]) and person (President Trump); but not time (1999), chronically ill in appearance; slow to answer questions HEENT: NCAT, PERRL, EOMI, sclera clear, MMM PULM: CTAB w/o wheezes/crackles, normal respiratory effort, on room air CV: RRR w/o M/G/R GI: abd soft, NTND, + BS MSK: no peripheral edema, all extremities independently NEURO: CN II-XII intact, no focal deficits, sensation to light touch intact PSYCH: Depressed mood, flat affect Integumentary: No concerning rashes/lesions/wounds noted on exposed skin surfaces    Data Reviewed: I have personally reviewed following labs and imaging studies  CBC: Recent Labs  Lab 09/20/23 1143 09/21/23 0954 09/22/23 0200 09/23/23 0538 09/24/23 1141 09/26/23 0522  WBC 5.6 9.6 7.2 8.1 8.1 7.6  NEUTROABS 3.1  --   --   --   --   --   HGB 11.4* 12.1 10.5* 10.3* 11.9* 11.2*  HCT 35.9* 37.5 32.5* 31.1* 36.8  34.6*  MCV 90.9 90.4 88.1 87.9 88.5 90.3  PLT 171 206 172 177 197 179   Basic Metabolic Panel: Recent Labs  Lab 09/21/23 0954 09/22/23 0200 09/22/23 1923 09/23/23 0538 09/23/23 2005 09/24/23 1141 09/26/23 0522  NA 137 135  --  141  --  140 138  K 3.5 5.1  --  4.4  --  4.5 4.2  CL 96* 96*  --  102  --  100 100  CO2 30 25  --  30  --  30 29  GLUCOSE 227* 276*  --  130*  --  135* 107*  BUN 15 18  --  12  --  9 15  CREATININE 1.36* 1.38*  --  0.92  --  1.05* 1.11*  CALCIUM 7.7* 7.4*  --  7.8*  --  8.7* 7.8*  MG  --   --  1.5*  --  2.7*  --  2.4  PHOS  --   --   --   --   --   --  4.7*   GFR: Estimated  Creatinine Clearance: 67.3 mL/min (A) (by C-G formula based on SCr of 1.11 mg/dL (H)). Liver Function Tests: Recent Labs  Lab 09/20/23 1143 09/21/23 1157 09/22/23 0200 09/26/23 0522  AST 62* 49* 56* 44*  ALT 30 28 28  68*  ALKPHOS 75 84 72 63  BILITOT 0.5 0.4 0.5 0.5  PROT 7.0 7.5 6.6 5.8*  ALBUMIN 3.6 3.8 3.0*  2.9* 2.7*   No results for input(s): "LIPASE", "AMYLASE" in the last 168 hours. No results for input(s): "AMMONIA" in the last 168 hours. Coagulation Profile: No results for input(s): "INR", "PROTIME" in the last 168 hours. Cardiac Enzymes: No results for input(s): "CKTOTAL", "CKMB", "CKMBINDEX", "TROPONINI" in the last 168 hours. BNP (last 3 results) No results for input(s): "PROBNP" in the last 8760 hours. HbA1C: No results for input(s): "HGBA1C" in the last 72 hours. CBG: Recent Labs  Lab 09/25/23 0627 09/25/23 1131 09/25/23 1635 09/25/23 2131 09/26/23 0621  GLUCAP 107* 198* 136* 139* 84   Lipid Profile: No results for input(s): "CHOL", "HDL", "LDLCALC", "TRIG", "CHOLHDL", "LDLDIRECT" in the last 72 hours. Thyroid Function Tests: Recent Labs    09/23/23 2005 09/24/23 1141  TSH 21.828*  --   FREET4  --  0.46*   Anemia Panel: No results for input(s): "VITAMINB12", "FOLATE", "FERRITIN", "TIBC", "IRON", "RETICCTPCT" in the last 72 hours. Sepsis Labs: Recent Labs  Lab 09/22/23 1152 09/22/23 1804 09/24/23 1141 09/26/23 0522  LATICACIDVEN 4.4* 6.2* 2.2* 1.2    Recent Results (from the past 240 hours)  Resp panel by RT-PCR (RSV, Flu A&B, Covid) Anterior Nasal Swab     Status: Abnormal   Collection Time: 09/18/23  6:02 PM   Specimen: Anterior Nasal Swab  Result Value Ref Range Status   SARS Coronavirus 2 by RT PCR NEGATIVE NEGATIVE Final    Comment: (NOTE) SARS-CoV-2 target nucleic acids are NOT DETECTED.  The SARS-CoV-2 RNA is generally detectable in upper respiratory specimens during the acute phase of infection. The lowest concentration of  SARS-CoV-2 viral copies this assay can detect is 138 copies/mL. A negative result does not preclude SARS-Cov-2 infection and should not be used as the sole basis for treatment or other patient management decisions. A negative result may occur with  improper specimen collection/handling, submission of specimen other than nasopharyngeal swab, presence of viral mutation(s) within the areas targeted by this assay, and inadequate number of viral copies(<138 copies/mL). A negative result  must be combined with clinical observations, patient history, and epidemiological information. The expected result is Negative.  Fact Sheet for Patients:  BloggerCourse.com  Fact Sheet for Healthcare Providers:  SeriousBroker.it  This test is no t yet approved or cleared by the [place] FDA and  has been authorized for detection and/or diagnosis of SARS-CoV-2 by FDA under an Emergency Use Authorization (EUA). This EUA will remain  in effect (meaning this test can be used) for the duration of the COVID-19 declaration under Section 564(b)(1) of the Act, 21 U.S.C.section 360bbb-3(b)(1), unless the authorization is terminated  or revoked sooner.       Influenza A by PCR POSITIVE (A) NEGATIVE Final   Influenza B by PCR NEGATIVE NEGATIVE Final    Comment: (NOTE) The Xpert Xpress SARS-CoV-2/FLU/RSV plus assay is intended as an aid in the diagnosis of influenza from Nasopharyngeal swab specimens and should not be used as a sole basis for treatment. Nasal washings and aspirates are unacceptable for Xpert Xpress SARS-CoV-2/FLU/RSV testing.  Fact Sheet for Patients: BloggerCourse.com  Fact Sheet for Healthcare Providers: SeriousBroker.it  This test is not yet approved or cleared by the [place] FDA and has been authorized for detection and/or diagnosis of SARS-CoV-2 by FDA under an Emergency Use  Authorization (EUA). This EUA will remain in effect (meaning this test can be used) for the duration of the COVID-19 declaration under Section 564(b)(1) of the Act, 21 U.S.C. section 360bbb-3(b)(1), unless the authorization is terminated or revoked.     Resp Syncytial Virus by PCR NEGATIVE NEGATIVE Final    Comment: (NOTE) Fact Sheet for Patients: BloggerCourse.com  Fact Sheet for Healthcare Providers: SeriousBroker.it  This test is not yet approved or cleared by the [place] FDA and has been authorized for detection and/or diagnosis of SARS-CoV-2 by FDA under an Emergency Use Authorization (EUA). This EUA will remain in effect (meaning this test can be used) for the duration of the COVID-19 declaration under Section 564(b)(1) of the Act, 21 U.S.C. section 360bbb-3(b)(1), unless the authorization is terminated or revoked.  Performed at [facility], [address] [place], [place] [address]   Culture, blood (routine x 2)     Status: None   Collection Time: 09/20/23 11:41 AM   Specimen: BLOOD  Result Value Ref Range Status   Specimen Description   Final    BLOOD BLOOD RIGHT ARM Performed at [facility], [address], [place], [place] [address]    Special Requests   Final    BOTTLES DRAWN AEROBIC AND ANAEROBIC Blood Culture adequate volume Performed at [facility], [address], [place], [place] [address]    Culture   Final    NO GROWTH 5 DAYS Performed at [facility], [address] [place], [place] [address]    Report Status 09/25/2023 FINAL  Final  MRSA Next Gen by PCR, Nasal     Status: None   Collection Time: 09/22/23  7:19 PM   Specimen: Nasal Mucosa; Nasal Swab  Result Value Ref Range Status   MRSA by PCR Next Gen NOT DETECTED NOT DETECTED Final    Comment: (NOTE) The GeneXpert MRSA Assay (FDA approved for NASAL specimens only), is one component of a comprehensive  MRSA colonization surveillance program. It is not intended to diagnose MRSA infection nor to guide or monitor treatment for MRSA infections. Test performance is not FDA approved in patients less than 35 years old. Performed at [facility], [address]  [place], [place] [address]          Radiology Studies: MR BRAIN WO CONTRAST Result Date: 09/25/2023 CLINICAL DATA:  Seizure, new onset, no history of trauma. EXAM: MRI HEAD WITHOUT CONTRAST TECHNIQUE: Multiplanar, multiecho pulse sequences of the brain and surrounding structures were obtained without intravenous contrast. COMPARISON:  Head CT September 22, 2023. FINDINGS: The study is partially degraded by motion. Brain: No acute infarction, hemorrhage, hydrocephalus, extra-axial collection or mass lesion. The brain parenchyma has normal morphology and signal characteristics. Vascular: Hypoplastic normal flow voids. Hypoplastic vertebrobasilar system. Skull and upper cervical spine: Normal marrow signal. Sinuses/Orbits: Mucosal thickening throughout the paranasal sinuses with opacification of the right maxillary sinus and anterior right ethmoid cells. The orbits are maintained. Left mastoid effusion. Other: None. IMPRESSION: 1. Unremarkable MRI of the brain. 2. Inflammatory paranasal sinus disease. 3. Left mastoid effusion. Electronically Signed   By: Baldemar Lenis M.D.   On: 09/25/2023 15:59        Scheduled Meds:  enoxaparin (LOVENOX) injection  40 mg Subcutaneous Q24H   escitalopram  10 mg Oral Daily   gabapentin  200 mg Oral TID   insulin aspart  0-5 Units Subcutaneous QHS   insulin aspart  0-9 Units Subcutaneous TID WC   levothyroxine  175 mcg Oral Q0600   methadone  5 mg Oral QHS   methadone  5 mg Oral BID   predniSONE  40 mg Oral Q breakfast   rOPINIRole  4 mg Oral QHS   sodium chloride flush  3 mL Intravenous Q12H   Continuous Infusions:  lacosamide (VIMPAT) 200 mg in sodium chloride 0.9 % 25 mL  IVPB Stopped (09/25/23 2255)   levETIRAcetam 500 mg (09/26/23 0907)     LOS: 3 days    Time spent: 52 minutes spent on chart review, discussion with nursing staff, consultants, updating family and interview/physical exam; more than 50% of that time was spent in counseling and/or coordination of care.    Alvira Philips [place], DO [facility] Available via Epic secure chat 7am-7pm After these hours, please refer to coverage provider listed on [email or web address] 09/26/2023, 10:49 AM

## 2023-09-26 NOTE — Evaluation (Signed)
Occupational Therapy Evaluation Patient Details Name: Brittany Nguyen MRN: [id number] DOB: October 02, 1965 Today's Date: 09/26/2023   History of Present Illness 58 y.o. female who presented initially at [facility] on 1/16 due to complaints of progressively worsening shortness of breath and found to be positive for influenza A. +seizure x3 with transfer to [facility]; CT head no acute changes;  MRI no acute changes; PMH significant of hypertension, CAD, CHF last EF 45% s/p AICD,  diabetes mellitus type 2, COPD/asthma, hypothyroidism, depression, DVT status post IVC filter, stomach cancer, seizure disorder, and chronic pain   Clinical Impression   Patient admitted for the diagnosis above.  PTA she lives at home with her spouse, helps care for three grandchildren, and remained independent with all ADL,iADL, and mobility, but no longer drives.  Currently patient presents with cognitive impairments, weakness, and unsteadiness impacting ADL independence.  OT will continue efforts to address deficits.  Post acute OT will depend on progress.        If plan is discharge home, recommend the following: A little help with walking and/or transfers;A lot of help with bathing/dressing/bathroom;Assist for transportation;Assistance with cooking/housework;Supervision due to cognitive status    Functional Status Assessment  Patient has had a recent decline in their functional status and demonstrates the ability to make significant improvements in function in a reasonable and predictable amount of time.  Equipment Recommendations  None recommended by OT    Recommendations for Other Services       Precautions / Restrictions Precautions Precautions: Fall;Other (comment) Precaution Comments: seizure Restrictions Weight Bearing Restrictions Per Provider Order: No      Mobility Bed Mobility Overal bed mobility: Needs Assistance Bed Mobility: Sit to Supine       Sit to supine: Supervision         Transfers Overall transfer level: Needs assistance Equipment used: Rolling walker (2 wheels) Transfers: Sit to/from Stand, Bed to chair/wheelchair/BSC Sit to Stand: Contact guard assist     Step pivot transfers: Contact guard assist            Balance Overall balance assessment: Needs assistance Sitting-balance support: Feet supported Sitting balance-Leahy Scale: Good     Standing balance support: Reliant on assistive device for balance Standing balance-Leahy Scale: Fair Standing balance comment: much slower pace than baseline                           ADL either performed or assessed with clinical judgement   ADL Overall ADL's : Needs assistance/impaired Eating/Feeding: Set up;Sitting   Grooming: Wash/dry hands;Wash/dry face;Set up;Sitting   Upper Body Bathing: Minimal assistance;Sitting   Lower Body Bathing: Moderate assistance;Sit to/from stand   Upper Body Dressing : Minimal assistance;Sitting   Lower Body Dressing: Moderate assistance;Sit to/from stand   Toilet Transfer: Ambulance person;Ambulation;Rolling walker (2 wheels)                   Vision Baseline Vision/History: 1 Wears glasses Patient Visual Report: Blurring of vision Additional Comments: Glasses are missing.  Patient difficutly keeping R eye open.     Perception Perception: Within Functional Limits       Praxis Praxis: WFL       Pertinent Vitals/Pain Pain Assessment Pain Assessment: No/denies pain Pain Intervention(s): Monitored during session     Extremity/Trunk Assessment Upper Extremity Assessment Upper Extremity Assessment: Generalized weakness   Lower Extremity Assessment Lower Extremity Assessment: Defer to PT evaluation   Cervical / Trunk Assessment  Cervical / Trunk Assessment: Other exceptions Cervical / Trunk Exceptions: overweight   Communication Communication Communication: Difficulty communicating thoughts/reduced clarity of  speech Cueing Techniques: Verbal cues   Cognition Arousal: Alert Behavior During Therapy: Flat affect Overall Cognitive Status: Impaired/Different from baseline Area of Impairment: Orientation, Attention, Memory, Following commands, Safety/judgement, Problem solving                 Orientation Level: Disoriented to, Time Current Attention Level: Sustained Memory: Decreased short-term memory Following Commands: Follows one step commands with increased time     Problem Solving: Slow processing, Decreased initiation, Requires verbal cues       General Comments  Husband present. BP dropped with sit to stand and recovered partially while ambulating    Exercises     Shoulder Instructions      Home Living Family/patient expects to be discharged to:: Private residence Living Arrangements: Spouse/significant other Available Help at Discharge: Family;Available 24 hours/day Type of Home: Mobile home Home Access: Stairs to enter Entrance Stairs-Number of Steps: 8 Entrance Stairs-Rails: Can reach both Home Layout: One level     Bathroom Shower/Tub: Chief Strategy Officer: Standard Bathroom Accessibility: Yes   Home Equipment: Grab bars - tub/shower;Shower seat - built in;Cane - single Librarian, academic (2 wheels)          Prior Functioning/Environment Prior Level of Function : Independent/Modified Independent;Driving             Mobility Comments: uses a cane intermittently due to knee pain ADLs Comments: independent with meds and takes care of most of finances.  helps with 3 grandchildren        OT Problem List: Decreased strength;Decreased activity tolerance;Impaired balance (sitting and/or standing);Decreased safety awareness;Decreased cognition      OT Treatment/Interventions: Self-care/ADL training;Therapeutic activities;Patient/family education;Energy conservation;Balance training;DME and/or AE instruction    OT Goals(Current goals can be  found in the care plan section) Acute Rehab OT Goals Patient Stated Goal: Return home OT Goal Formulation: With patient Time For Goal Achievement: 10/10/23 Potential to Achieve Goals: Fair ADL Goals Pt Will Perform Grooming: with modified independence;standing Pt Will Perform Lower Body Dressing: with modified independence;sit to/from stand Pt Will Transfer to Toilet: with modified independence;ambulating;regular height toilet  OT Frequency: Min 1X/week    Co-evaluation              AM-PAC OT "6 Clicks" Daily Activity     Outcome Measure Help from another person eating meals?: None Help from another person taking care of personal grooming?: None Help from another person toileting, which includes using toliet, bedpan, or urinal?: A Little Help from another person bathing (including washing, rinsing, drying)?: A Lot Help from another person to put on and taking off regular upper body clothing?: A Little Help from another person to put on and taking off regular lower body clothing?: A Lot 6 Click Score: 18   End of Session Equipment Utilized During Treatment: Rolling walker (2 wheels) Nurse Communication: Mobility status  Activity Tolerance: Patient tolerated treatment well Patient left: in bed;with call bell/phone within reach;with family/visitor present  OT Visit Diagnosis: Unsteadiness on feet (R26.81);Muscle weakness (generalized) (M62.81);Other symptoms and signs involving cognitive function                Time: 8295-6213 OT Time Calculation (min): 19 min Charges:  OT General Charges $OT Visit: 1 Visit OT Evaluation $OT Eval Moderate Complexity: 1 Mod  09/26/2023  RP, OTR/L  Acute Rehabilitation Services  Office:  [phone]  Zayvien Canning D Deaken Jurgens 09/26/2023, 3:26 PM

## 2023-09-26 NOTE — Plan of Care (Signed)
Alert, oriented x2-3. Able to ambulate to bathroom and OOB to chair.  Telesitter remains at bedside for safety precautions.  More alert, awake, and interactive today.   Problem: Education: Goal: Knowledge of General Education information will improve Description: Including pain rating scale, medication(s)/side effects and non-pharmacologic comfort measures Outcome: Progressing   Problem: Health Behavior/Discharge Planning: Goal: Ability to manage health-related needs will improve Outcome: Progressing   Problem: Clinical Measurements: Goal: Ability to maintain clinical measurements within normal limits will improve Outcome: Progressing Goal: Will remain free from infection Outcome: Progressing Goal: Diagnostic test results will improve Outcome: Progressing Goal: Respiratory complications will improve Outcome: Progressing Goal: Cardiovascular complication will be avoided Outcome: Progressing   Problem: Activity: Goal: Risk for activity intolerance will decrease Outcome: Progressing   Problem: Nutrition: Goal: Adequate nutrition will be maintained Outcome: Progressing   Problem: Elimination: Goal: Will not experience complications related to bowel motility Outcome: Progressing Goal: Will not experience complications related to urinary retention Outcome: Progressing

## 2023-09-27 DIAGNOSIS — G40909 Epilepsy, unspecified, not intractable, without status epilepticus: Secondary | ICD-10-CM | POA: Diagnosis not present

## 2023-09-27 LAB — GLUCOSE, CAPILLARY: Glucose-Capillary: 127 mg/dL — ABNORMAL HIGH (ref 70–99)

## 2023-09-27 MED ORDER — LEVOTHYROXINE SODIUM 175 MCG PO TABS: 175.0000 ug | ORAL_TABLET | Freq: Every day | ORAL | 0 refills | Status: DC

## 2023-09-27 MED ORDER — LACOSAMIDE 200 MG PO TABS: 200.0000 mg | ORAL_TABLET | Freq: Two times a day (BID) | ORAL | 0 refills | Status: DC

## 2023-09-27 NOTE — TOC Transition Note (Signed)
Transition of Care [facility]) - Discharge Note   Patient Details  Name: Brittany Nguyen MRN: [id number] Date of Birth: 23-Mar-1966  [facility] CM/SW Contact:  Kermit Balo, RN Phone Number: 09/27/2023, 11:18 AM   Clinical Narrative:     Pt is discharging home with her spouse. She will attend outpatient OT at [facility] main rehab. Referral sent and information on the AVS.  Pt has supervision at home and transportation to home.  Final next level of care: OP Rehab Barriers to Discharge: No Barriers Identified   Patient Goals and CMS Choice     Choice offered to / list presented to : Patient      Discharge Placement                       Discharge Plan and Services Additional resources added to the After Visit Summary for     Discharge Planning Services: CM Consult                                 Social Drivers of Health (SDOH) Interventions SDOH Screenings   Food Insecurity: Patient Unable To Answer (09/23/2023)  Housing: Unknown (09/23/2023)  Transportation Needs: Patient Unable To Answer (09/24/2023)  Utilities: Patient Unable To Answer (09/24/2023)  Financial Resource Strain: High Risk (12/07/2020)   Received from [facility], [facility]  Physical Activity: Insufficiently Active (12/07/2020)   Received from [facility], [facility]  Social Connections: Socially Integrated (09/22/2023)  Stress: Stress Concern Present (12/07/2020)   Received from [facility], [facility]  Tobacco Use: Low Risk  (09/26/2023)     Readmission Risk Interventions     No data to display

## 2023-09-27 NOTE — Discharge Instructions (Addendum)
Seizure precautions: Per [facility] statutes, patients with seizures are not allowed to drive until they have been seizure-free for six months and cleared by a physician    Use caution when using heavy equipment or power tools. Avoid working on ladders or at heights. Take showers instead of baths. Ensure the water temperature is not too high on the home water heater. Do not go swimming alone. Do not lock yourself in a room alone (i.e. bathroom). When caring for infants or small children, sit down when holding, feeding, or changing them to minimize risk of injury to the child in the event you have a seizure. Maintain good sleep hygiene. Avoid alcohol.    If patient has another seizure, call 911 and bring them back to the ED if: A.  The seizure lasts longer than 5 minutes.      B.  The patient doesn't wake shortly after the seizure or has new problems such as difficulty seeing, speaking or moving following the seizure C.  The patient was injured during the seizure D.  The patient has a temperature over 102 F (39C) E.  The patient vomited during the seizure and now is having trouble breathing    During the Seizure   - First, ensure adequate ventilation and place patients on the floor on their left side  Loosen clothing around the neck and ensure the airway is patent. If the patient is clenching the teeth, do not force the mouth open with any object as this can cause severe damage - Remove all items from the surrounding that can be hazardous. The patient may be oblivious to what's happening and may not even know what he or she is doing. If the patient is confused and wandering, either gently guide him/her away and block access to outside areas - Reassure the individual and be comforting - Call 911. In most cases, the seizure ends before EMS arrives. However, there are cases when seizures may last over 3 to 5 minutes. Or the individual may have developed breathing difficulties or severe  injuries. If a pregnant patient or a person with diabetes develops a seizure, it is prudent to call an ambulance.      After the Seizure (Postictal Stage)   After a seizure, most patients experience confusion, fatigue, muscle pain and/or a headache. Thus, one should permit the individual to sleep. For the next few days, reassurance is essential. Being calm and helping reorient the person is also of importance.   Most seizures are painless and end spontaneously. Seizures are not harmful to others but can lead to complications such as stress on the lungs, brain and the heart. Individuals with prior lung problems may develop labored breathing and respiratory distress.

## 2023-09-27 NOTE — Discharge Summary (Signed)
Physician Discharge Summary  Brittany Nguyen ZOX:[id number] DOB: 10/25/1965 DOA: 09/23/2023  PCP: Jerl Mina, MD  Admit date: 09/23/2023 Discharge date: 09/27/2023  Admitted From: Home Disposition: Home  Recommendations for Outpatient Follow-up:  Follow up with PCP in 1-2 weeks Follow-up with neurology 2-3 months Vimpat increased to 200 mg p.o. twice daily Recommend repeat TFTs 4 to 6 weeks and encourage compliance with levothyroxine  Home Health: No Equipment/Devices: None  Discharge Condition: Stable CODE STATUS: Full code Diet recommendation: Heart healthy diet  History of present illness:  Brittany Nguyen is a 58 y.o. 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  History is obtained from review of records as she is currently unable to provide history as she appears to be postictal and has perseveration what ever is said to her.  Patient had been reportedly taking Tamiflu intermittently.  Respiratory status reportedly declined despite using home inhalers.  In the ER was noted to be afebrile stable vital signs satting well on room air.  Labs from 1/16 significant for CBC within normal limits, BUN 15, creatinine 1.3 6,  glucose 227,  AST 49, and lactic acid 1.9-> 3.1.  Patient was noted to have had a seizure evening of 1/16.  Neurology had been consulted after second seizure on 9/17 where rapid response was called.  Repeat lactic acid was noted to trend up on 1/17 up to 6.2.  Patient had a third seizure on the evening of 1/17 for which continuous EEG monitoring was recommended and transferred to [facility].   After arrival here patient was reported to be responsive for approximately 20 minutes prior to coming to.   Hospital course:  Seizure disorder with concern for breakthrough Patient reported to have had 3 seizures since admission on 1/16.  CT scan of the head no acute abnormality.  She had been evaluated by neurology and loaded with Keppra IV.  No seizure or epileptiform discharges noted on EEG.  MRI without contrast with no acute findings.  Neurology was consulted and followed during hospital course.  Vimpat was increased to 200 mg p.o. twice daily.  Outpatient follow-up with neurology 2-3 months.   COPD Influenza A Patient diagnosed with influenza A on 1/15.  Chest x-ray otherwise noted to be clear.  Completed course of Tamiflu and prednisone.   Chronic systolic and diastolic congestive heart failure, compensated TTE with last EF noted to be 45% with grade 1 diastolic dysfunction back in 01/2018.   Essential hypertension BP 117/75; discontinued home metoprolol, losartan.   Diabetes mellitus type 2 without complication Hemoglobin A1c 6.4 on 08/21/2023.  Diet controlled at baseline.   Normocytic anemia, chronic Hemoglobin 10.3, stable   Hypothyroidism TSH 21.828, free T40.46.  On levothyroxine 175 mcg p.o. daily outpatient.  Concern for possible noncompliance. Continue levothyroxine 175 mcg PO daily.  Repeat TFTs 4-6 weeks   Depression and anxiety Continue Lexapro 10 mg p.o. daily, Ativan as needed.   S/p AICD Placed at [facility] 03/29/2017.   Lead Icd  Sgl Coil Tripolar Active Fix Df-4 Willadean Carol - [id number] - Implanted   Inventory item: Lead Icd Sgl Coil Tripolar Active Fix Df-4 Conn  Sprin Model/Cat number: [id number] Serial number: [id number] Manufacturer: [employer] [place] Device identifier: [id number] Device identifier type: GS1 GUDID Information   Request status Successful   Brand name: Sprint Quattro Secure S MRIT SureScanT Version/Model: 5366Y40   ICD Device Bonney Leitz Xt Dr  Device Dual Chamber - [id number] - Implanted   Inventory item: Device [name] Model/Cat number: [id number] Serial number: [id number] [facility] Manufacturer: [employer] [place] Device identifier: [id number] Device identifier type: GS1 GUDID Information   Request status Successful   Brand name: Evera MRIT XT DR SureScan Version/Model: [id number] Company name: [employer] MRI safety info as of 03/29/17: MR Conditional Contains dry or latex rubber: No   GMDN P.T. name: Dual-chamber implantable defibrillator    Company name: [employer] MRI safety info as of 03/29/17: MR Conditional    Chronic pain syndrome Gabapentin 300 mg TID, Methadone 7.5mg  BID and 10mg  PO at bedtime, Percocet 7.5-325 mg every 4 hours as needed   RLS: Requip 4 mg p.o. nightly   OSA on CPAP Continue nocturnal CPAP   Weakness/debility/deconditioning/gait disturbance: No needs identified by PT.  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   HTN (hypertension)   Type 2 diabetes mellitus without complication (HCC)   Normocytic anemia   Hypothyroidism   Depression with anxiety   OSA on CPAP    Discharge Instructions  Discharge Instructions     Ambulatory referral to Occupational Therapy   Complete by: As directed    Call MD for:  difficulty breathing, headache or visual disturbances   Complete by: As directed    Call MD for:  extreme fatigue   Complete by: As directed    Call MD for:  persistant dizziness or light-headedness   Complete by: As directed    Call MD for:  persistant nausea and vomiting   Complete by: As directed    Call MD for:  severe uncontrolled pain   Complete by: As directed    Call MD for:  temperature >100.4   Complete by: As directed    Diet - low sodium heart healthy    Complete by: As directed    Increase activity slowly   Complete by: As directed       Allergies as of 09/27/2023       Reactions   Contrast Media [iodinated Contrast Media] Shortness Of Breath   Per CT report in 2014 pt had break through contrast reaction with hives and SOB following 13 hour prep. MSY    Ferumoxytol Nausea Only   Iron Sucrose Anaphylaxis   Lidocaine Hives   Metrizamide Shortness Of Breath   Penicillins Hives, Other (See Comments)   IgE = 4 (WNL) on 08/12/2022 ediate rash, facial/tongue/throat swelling, SOB or lightheadedness with hypotension, tachy   Sacubitril-valsartan    Sz (unclear if new start entresto vs hypoglycemia)   Isosorbide Nitrate Other (See Comments)   Headache   Latex Hives   IgE < 0.10 (WNL) on 08/21/2023   Ondansetron Other (See Comments)   Severe headache   Tizanidine Other (See Comments)   Feels altered   Povidone-iodine Rash   Blistering rash   Pulmicort [budesonide] Itching        Medication List     PAUSE taking these medications    losartan 25 MG tablet Wait to take this  until your doctor or other care provider tells you to start again. Commonly known as: COZAAR Take 12.5 mg by mouth in the morning and at bedtime.   metoprolol tartrate 50 MG tablet Wait to take this until your doctor or other care provider tells you to start again. Commonly known as: LOPRESSOR Take 50 mg by mouth 2 (two) times daily.   torsemide 20 MG tablet Wait to take this until your doctor or other care provider tells you to start again. Commonly known as: DEMADEX Take 20 mg by mouth 2 (two) times daily.       STOP taking these medications    lacosamide 200 mg in sodium chloride 0.9 % 25 mL   levETIRAcetam 1000 MG/100ML Soln Commonly known as: KEPPRA   potassium chloride 20 MEQ packet Commonly known as: KLOR-CON       TAKE these medications    albuterol 108 (90 Base) MCG/ACT inhaler Commonly known as: VENTOLIN HFA Inhale 1-2 puffs  into the lungs every 6 (six) hours as needed for wheezing or shortness of breath.   escitalopram 10 MG tablet Commonly known as: LEXAPRO Take 10 mg by mouth daily.   gabapentin 300 MG capsule Commonly known as: NEURONTIN Take 1 capsule (300 mg total) by mouth at bedtime. What changed: when to take this   guaiFENesin-codeine 100-10 MG/5ML syrup Take 5 mLs by mouth every 6 (six) hours as needed for cough.   lacosamide 200 MG Tabs tablet Commonly known as: VIMPAT Take 1 tablet (200 mg total) by mouth 2 (two) times daily. What changed:  medication strength how much to take when to take this   levothyroxine 175 MCG tablet Commonly known as: SYNTHROID Take 1 tablet (175 mcg total) by mouth daily at 6 (six) AM. Start taking on: September 28, 2023 What changed: when to take this   LORazepam 1 MG tablet Commonly known as: ATIVAN Take 1 tablet (1 mg total) by mouth 4 (four) times daily as needed.   methadone 5 MG tablet Commonly known as: DOLOPHINE Take 1.5 tablets (7.5 mg total) by mouth 2 (two) times daily at 10 am and 4 pm. What changed: Another medication with the same name was removed. Continue taking this medication, and follow the directions you see here.   oxyCODONE-acetaminophen 7.5-325 MG tablet Commonly known as: PERCOCET Take 1 tablet by mouth every 4 (four) hours as needed for severe pain (pain score 7-10).   promethazine 25 MG tablet Commonly known as: PHENERGAN Take 25 mg by mouth every 4 (four) hours as needed for nausea or vomiting.   rOPINIRole 4 MG tablet Commonly known as: REQUIP Take 4 mg by mouth at bedtime.        Follow-up Information     Jerl Mina, MD. Schedule an appointment as soon as possible for a visit in 1 week(s).   Specialty: Family Medicine Contact information: [address] [facility] [place] [address] [phone]         Lonell Face, MD. Schedule an appointment as soon as possible for a visit in 2  month(s).   Specialty: Neurology Contact information: [address] [facility] [place] [address] [phone]         [facility] Follow up.   Specialty: Rehabilitation Why: The outpatient therapy will contact you for the first appointment Contact information: [address] [place] [address] [phone]  Allergies  Allergen Reactions   Contrast Media [Iodinated Contrast Media] Shortness Of Breath    Per CT report in 2014 pt had break through contrast reaction with hives and SOB following 13 hour prep. MSY    Ferumoxytol Nausea Only   Iron Sucrose Anaphylaxis   Lidocaine Hives   Metrizamide Shortness Of Breath   Penicillins Hives and Other (See Comments)    IgE = 4 (WNL) on 08/12/2022  ediate rash, facial/tongue/throat swelling, SOB or lightheadedness with hypotension, tachy   Sacubitril-Valsartan     Sz (unclear if new start entresto vs hypoglycemia)   Isosorbide Nitrate Other (See Comments)    Headache   Latex Hives    IgE < 0.10 (WNL) on 08/21/2023   Ondansetron Other (See Comments)    Severe headache   Tizanidine Other (See Comments)    Feels altered   Povidone-Iodine Rash    Blistering rash   Pulmicort [Budesonide] Itching    Consultations: Neurology   Procedures/Studies: MR BRAIN WO CONTRAST Result Date: 09/25/2023 CLINICAL DATA:  Seizure, new onset, no history of trauma. EXAM: MRI HEAD WITHOUT CONTRAST TECHNIQUE: Multiplanar, multiecho pulse sequences of the brain and surrounding structures were obtained without intravenous contrast. COMPARISON:  Head CT September 22, 2023. FINDINGS: The study is partially degraded by motion. Brain: No acute infarction, hemorrhage, hydrocephalus, extra-axial collection or mass lesion. The brain parenchyma has normal morphology and signal characteristics. Vascular: Hypoplastic normal flow voids. Hypoplastic  vertebrobasilar system. Skull and upper cervical spine: Normal marrow signal. Sinuses/Orbits: Mucosal thickening throughout the paranasal sinuses with opacification of the right maxillary sinus and anterior right ethmoid cells. The orbits are maintained. Left mastoid effusion. Other: None. IMPRESSION: 1. Unremarkable MRI of the brain. 2. Inflammatory paranasal sinus disease. 3. Left mastoid effusion. Electronically Signed   By: Baldemar Lenis M.D.   On: 09/25/2023 15:59   Overnight EEG with video Result Date: 09/24/2023 Charlsie Quest, MD     09/25/2023  9:35 AM Patient Name: Oakleigh Corwin Viger MRN: [id number] Epilepsy Attending: Charlsie Quest Referring Physician/Provider: Rejeana Brock, MD Duration: 09/23/2023 1810 to 09/25/2023 1401 Patient history: 58yo F with seizure like activity getting eeg to evaluate for seizure Level of alertness: Awake/ lethargic AEDs during EEG study: LEV, LCM, GBP Technical aspects: This EEG study was done with scalp electrodes positioned according to the 10-20 International system of electrode placement. Electrical activity was reviewed with band pass filter of 1-70Hz , sensitivity of 7 uV/mm, display speed of 66mm/sec with a 60Hz  notched filter applied as appropriate. EEG data were recorded continuously and digitally stored.  Video monitoring was available and reviewed as appropriate. Description: EEG showed continuous generalized 3 to 6 Hz theta-delta slowing. Hyperventilation and photic stimulation were not performed.  EEG was difficult to interpret between 09/23/2023 2105 to 09/24/2023 0024 and on  09/24/2023 between 0316 to 0931 as patient pulled all electrodes. ABNORMALITY - Continuous slow, generalized IMPRESSION: This study is suggestive of moderate diffuse encephalopathy. No seizures or epileptiform discharges were seen throughout the recording. Priyanka Annabelle Harman   CT HEAD WO CONTRAST ( ) Result Date: 09/22/2023 CLINICAL DATA:  Seizure disorder.  EXAM: CT HEAD WITHOUT CONTRAST TECHNIQUE: Contiguous axial images were obtained from the base of the skull through the vertex without intravenous contrast. RADIATION DOSE REDUCTION: This exam was performed according to the departmental dose-optimization program which includes automated exposure control, adjustment of the mA and/or kV according to patient size and/or use of iterative reconstruction technique. COMPARISON:  None Available.  FINDINGS: Brain: No mass,hemorrhage or extra-axial collection. Normal appearance of the parenchyma and CSF spaces. Vascular: No hyperdense vessel or unexpected vascular calcification. Skull: The visualized skull base, calvarium and extracranial soft tissues are normal. Sinuses/Orbits: No fluid levels or advanced mucosal thickening of the visualized paranasal sinuses. No mastoid or middle ear effusion. Normal orbits. Other: None. IMPRESSION: Normal head CT. Electronically Signed   By: Deatra Robinson M.D.   On: 09/22/2023 20:48   EEG adult Result Date: 09/22/2023 Rejeana Brock, MD     09/22/2023  7:32 PM History: 58 year old female being evaluated for possible seizures Sedation: Ativan given earlier Patient State: Awake and asleep Technique: This EEG was acquired with electrodes placed according to the International 10-20 electrode system (including Fp1, Fp2, F3, F4, C3, C4, P3, P4, O1, O2, T3, T4, T5, T6, A1, A2, Fz, Cz, Pz). The following electrodes were missing or displaced: none. Background: The background is relatively well-organized though there is a slow posterior dominant rhythm with a frequency of 6 to 7 Hz.  There is also mild intrusion of irregular slow activity in the delta and theta range.  Selectivity increases with drowsiness and sleep is recorded with symmetric appearing structures.  There is no epileptiform activity seen. Photic stimulation: Physiologic driving is not performed EEG Abnormalities: 1) generalized irregular slow activity 2) slow posterior  dominant rhythm Clinical Interpretation: This EEG is consistent with a mild generalized nonspecific cerebral dysfunction (encephalopathy). There was no seizure or seizure predisposition recorded on this study. Please note that lack of epileptiform activity on EEG does not preclude the possibility of epilepsy. Ritta Slot, MD [facility] [phone] If 7pm- 7am, please page neurology on call as listed in AMION.  CT HEAD WO CONTRAST ( ) Result Date: 09/21/2023 CLINICAL DATA:  Mental status change EXAM: CT HEAD WITHOUT CONTRAST TECHNIQUE: Contiguous axial images were obtained from the base of the skull through the vertex without intravenous contrast. RADIATION DOSE REDUCTION: This exam was performed according to the departmental dose-optimization program which includes automated exposure control, adjustment of the mA and/or kV according to patient size and/or use of iterative reconstruction technique. COMPARISON:  CT brain 05/26/2023 FINDINGS: Brain: No evidence of acute infarction, hemorrhage, hydrocephalus, extra-axial collection or mass lesion/mass effect. Vascular: No hyperdense vessels.  No unexpected calcification Skull: Normal. Negative for fracture or focal lesion. Sinuses/Orbits: No acute finding. Osteoma left frontal ethmoidal sinuses. Mucosal thickening in the maxillary and ethmoid sinuses Other: None IMPRESSION: Negative non contrasted CT appearance of the brain. Electronically Signed   By: Jasmine Pang M.D.   On: 09/21/2023 17:41   DG Chest 2 View Result Date: 09/21/2023 CLINICAL DATA:  COPD EXAM: CHEST - 2 VIEW COMPARISON:  09/20/2023 FINDINGS: Left-sided implanted cardiac device in stable positioning. The heart size and mediastinal contours are unchanged. Slightly low lung volumes. No focal airspace consolidation, pleural effusion, or pneumothorax. The visualized skeletal structures are unremarkable. IMPRESSION: No active cardiopulmonary disease. Electronically Signed   By:  Duanne Guess D.O.   On: 09/21/2023 11:03   DG Chest 2 View Result Date: 09/20/2023 CLINICAL DATA:  Shortness of breath. EXAM: CHEST - 2 VIEW COMPARISON:  Chest radiograph dated June 06, 2023. FINDINGS: The cardiomediastinal silhouette is unchanged. Stable left subclavian dual lead AICD in place. No focal consolidation, pleural effusion, or pneumothorax. No acute osseous abnormality. IMPRESSION: No acute cardiopulmonary findings. Electronically Signed   By: Hart Robinsons M.D.   On: 09/20/2023 13:45     Subjective: Patient seen examined bedside, resting calmly.  No complaints this morning.  Mental status remarkably improved and at baseline.  Neurology now signed off with recommendation of increased dose of Vimpat and outpatient follow-up with her primary neurologist in 2-3 months.  Patient with no other specific complaints, concerns or questions at this time.  Denies headache, no visual changes, no chest pain, no palpitations, no shortness of breath, no abdominal pain, no fever/chills/night sweats, no nausea/vomiting/diarrhea, no focal weakness, no fatigue, no paresthesia.  No acute events overnight per nursing staff.  Discharge Exam: Vitals:   09/27/23 0334 09/27/23 0752  BP: 117/75 99/79  Pulse: 61 72  Resp: 18 20  Temp: 98.4 F (36.9 C) 98.7 F (37.1 C)  SpO2:  92%   Vitals:   09/26/23 2328 09/27/23 0334 09/27/23 0704 09/27/23 0752  BP: 118/68 117/75  99/79  Pulse: (!) 59 61  72  Resp: 18 18  20   Temp: 98.4 F (36.9 C) 98.4 F (36.9 C)  98.7 F (37.1 C)  TempSrc: Oral Oral  Oral  SpO2:    92%  Weight:   100.4 kg   Height:        Physical Exam: GEN: NAD, alert and oriented x 3, chronically ill in appearance, appears older than stated age HEENT: NCAT, PERRL, EOMI, sclera clear, MMM PULM: CTAB w/o wheezes/crackles, normal respiratory effort, on room air CV: RRR w/o M/G/R GI: abd soft, NTND, NABS, no R/G/M MSK: no peripheral edema, muscle strength globally intact 5/5  bilateral upper/lower extremities NEURO: CN II-XII intact, no focal deficits, sensation to light touch intact PSYCH: normal mood/affect Integumentary: dry/intact, no rashes or wounds    The results of significant diagnostics from this hospitalization (including imaging, microbiology, ancillary and laboratory) are listed below for reference.     Microbiology: Recent Results (from the past 240 hours)  Resp panel by RT-PCR (RSV, Flu A&B, Covid) Anterior Nasal Swab     Status: Abnormal   Collection Time: 09/18/23  6:02 PM   Specimen: Anterior Nasal Swab  Result Value Ref Range Status   SARS Coronavirus 2 by RT PCR NEGATIVE NEGATIVE Final    Comment: (NOTE) SARS-CoV-2 target nucleic acids are NOT DETECTED.  The SARS-CoV-2 RNA is generally detectable in upper respiratory specimens during the acute phase of infection. The lowest concentration of SARS-CoV-2 viral copies this assay can detect is 138 copies/mL. A negative result does not preclude SARS-Cov-2 infection and should not be used as the sole basis for treatment or other patient management decisions. A negative result may occur with  improper specimen collection/handling, submission of specimen other than nasopharyngeal swab, presence of viral mutation(s) within the areas targeted by this assay, and inadequate number of viral copies(<138 copies/mL). A negative result must be combined with clinical observations, patient history, and epidemiological information. The expected result is Negative.  Fact Sheet for Patients:  BloggerCourse.com  Fact Sheet for Healthcare Providers:  SeriousBroker.it  This test is no t yet approved or cleared by the [place] FDA and  has been authorized for detection and/or diagnosis of SARS-CoV-2 by FDA under an Emergency Use Authorization (EUA). This EUA will remain  in effect (meaning this test can be used) for the duration of the COVID-19  declaration under Section 564(b)(1) of the Act, 21 U.S.C.section 360bbb-3(b)(1), unless the authorization is terminated  or revoked sooner.       Influenza A by PCR POSITIVE (A) NEGATIVE Final   Influenza B by PCR NEGATIVE NEGATIVE Final    Comment: (NOTE) The Xpert Xpress SARS-CoV-2/FLU/RSV  plus assay is intended as an aid in the diagnosis of influenza from Nasopharyngeal swab specimens and should not be used as a sole basis for treatment. Nasal washings and aspirates are unacceptable for Xpert Xpress SARS-CoV-2/FLU/RSV testing.  Fact Sheet for Patients: BloggerCourse.com  Fact Sheet for Healthcare Providers: SeriousBroker.it  This test is not yet approved or cleared by the [place] FDA and has been authorized for detection and/or diagnosis of SARS-CoV-2 by FDA under an Emergency Use Authorization (EUA). This EUA will remain in effect (meaning this test can be used) for the duration of the COVID-19 declaration under Section 564(b)(1) of the Act, 21 U.S.C. section 360bbb-3(b)(1), unless the authorization is terminated or revoked.     Resp Syncytial Virus by PCR NEGATIVE NEGATIVE Final    Comment: (NOTE) Fact Sheet for Patients: BloggerCourse.com  Fact Sheet for Healthcare Providers: SeriousBroker.it  This test is not yet approved or cleared by the [place] FDA and has been authorized for detection and/or diagnosis of SARS-CoV-2 by FDA under an Emergency Use Authorization (EUA). This EUA will remain in effect (meaning this test can be used) for the duration of the COVID-19 declaration under Section 564(b)(1) of the Act, 21 U.S.C. section 360bbb-3(b)(1), unless the authorization is terminated or revoked.  Performed at [facility], [address] [place], [place] [address]   Culture, blood (routine x 2)     Status: None   Collection Time: 09/20/23  11:41 AM   Specimen: BLOOD  Result Value Ref Range Status   Specimen Description   Final    BLOOD BLOOD RIGHT ARM Performed at [facility], [address], [place], [place] [address]    Special Requests   Final    BOTTLES DRAWN AEROBIC AND ANAEROBIC Blood Culture adequate volume Performed at [facility], [address], [place], [place] [address]    Culture   Final    NO GROWTH 5 DAYS Performed at [facility], [address] [place], [place] [address]    Report Status 09/25/2023 FINAL  Final  MRSA Next Gen by PCR, Nasal     Status: None   Collection Time: 09/22/23  7:19 PM   Specimen: Nasal Mucosa; Nasal Swab  Result Value Ref Range Status   MRSA by PCR Next Gen NOT DETECTED NOT DETECTED Final    Comment: (NOTE) The GeneXpert MRSA Assay (FDA approved for NASAL specimens only), is one component of a comprehensive MRSA colonization surveillance program. It is not intended to diagnose MRSA infection nor to guide or monitor treatment for MRSA infections. Test performance is not FDA approved in patients less than 89 years old. Performed at [facility], [address] [place], [place] [address]      Labs: BNP (last 3 results) Recent Labs    09/20/23 1143 09/21/23 0954  BNP 21.5 20.6   Basic Metabolic Panel: Recent Labs  Lab 09/21/23 0954 09/22/23 0200 09/22/23 1923 09/23/23 0538 09/23/23 2005 09/24/23 1141 09/26/23 0522  NA 137 135  --  141  --  140 138  K 3.5 5.1  --  4.4  --  4.5 4.2  CL 96* 96*  --  102  --  100 100  CO2 30 25  --  30  --  30 29  GLUCOSE 227* 276*  --  130*  --  135* 107*  BUN 15 18  --  12  --  9 15  CREATININE 1.36* 1.38*  --  0.92  --  1.05* 1.11*  CALCIUM 7.7* 7.4*  --  7.8*  --  8.7* 7.8*  MG  --   --  1.5*  --  2.7*  --  2.4  PHOS  --   --   --   --   --   --  4.7*   Liver Function Tests: Recent Labs  Lab 09/20/23 1143 09/21/23 1157 09/22/23 0200 09/26/23 0522  AST 62* 49* 56* 44*  ALT  30 28 28  68*  ALKPHOS 75 84 72 63  BILITOT 0.5 0.4 0.5 0.5  PROT 7.0 7.5 6.6 5.8*  ALBUMIN 3.6 3.8 3.0*  2.9* 2.7*   No results for input(s): "LIPASE", "AMYLASE" in the last 168 hours. No results for input(s): "AMMONIA" in the last 168 hours. CBC: Recent Labs  Lab 09/20/23 1143 09/21/23 0954 09/22/23 0200 09/23/23 0538 09/24/23 1141 09/26/23 0522  WBC 5.6 9.6 7.2 8.1 8.1 7.6  NEUTROABS 3.1  --   --   --   --   --   HGB 11.4* 12.1 10.5* 10.3* 11.9* 11.2*  HCT 35.9* 37.5 32.5* 31.1* 36.8 34.6*  MCV 90.9 90.4 88.1 87.9 88.5 90.3  PLT 171 206 172 177 197 179   Cardiac Enzymes: No results for input(s): "CKTOTAL", "CKMB", "CKMBINDEX", "TROPONINI" in the last 168 hours. BNP: Invalid input(s): "POCBNP" CBG: Recent Labs  Lab 09/26/23 0621 09/26/23 1127 09/26/23 1617 09/26/23 2108 09/27/23 0614  GLUCAP 84 234* 153* 126* 127*   D-Dimer No results for input(s): "DDIMER" in the last 72 hours. Hgb A1c No results for input(s): "HGBA1C" in the last 72 hours. Lipid Profile No results for input(s): "CHOL", "HDL", "LDLCALC", "TRIG", "CHOLHDL", "LDLDIRECT" in the last 72 hours. Thyroid function studies No results for input(s): "TSH", "T4TOTAL", "T3FREE", "THYROIDAB" in the last 72 hours.  Invalid input(s): "FREET3" Anemia work up No results for input(s): "VITAMINB12", "FOLATE", "FERRITIN", "TIBC", "IRON", "RETICCTPCT" in the last 72 hours. Urinalysis    Component Value Date/Time   COLORURINE YELLOW (A) 08/21/2023 0922   APPEARANCEUR HAZY (A) 08/21/2023 0922   APPEARANCEUR Clear 09/02/2014 2312   LABSPEC 1.017 08/21/2023 0922   LABSPEC 1.013 09/02/2014 2312   PHURINE 5.0 08/21/2023 0922   GLUCOSEU NEGATIVE 08/21/2023 0922   GLUCOSEU Negative 09/02/2014 2312   HGBUR NEGATIVE 08/21/2023 0922   BILIRUBINUR NEGATIVE 08/21/2023 0922   BILIRUBINUR Negative 09/02/2014 2312   KETONESUR NEGATIVE 08/21/2023 0922   PROTEINUR NEGATIVE 08/21/2023 0922   NITRITE NEGATIVE 08/21/2023  0922   LEUKOCYTESUR SMALL (A) 08/21/2023 0922   LEUKOCYTESUR Negative 09/02/2014 2312   Sepsis Labs Recent Labs  Lab 09/22/23 0200 09/23/23 0538 09/24/23 1141 09/26/23 0522  WBC 7.2 8.1 8.1 7.6   Microbiology Recent Results (from the past 240 hours)  Resp panel by RT-PCR (RSV, Flu A&B, Covid) Anterior Nasal Swab     Status: Abnormal   Collection Time: 09/18/23  6:02 PM   Specimen: Anterior Nasal Swab  Result Value Ref Range Status   SARS Coronavirus 2 by RT PCR NEGATIVE NEGATIVE Final    Comment: (NOTE) SARS-CoV-2 target nucleic acids are NOT DETECTED.  The SARS-CoV-2 RNA is generally detectable in upper respiratory specimens during the acute phase of infection. The lowest concentration of SARS-CoV-2 viral copies this assay can detect is 138 copies/mL. A negative result does not preclude SARS-Cov-2 infection and should not be used as the sole basis for treatment or other patient management decisions. A negative result may occur with  improper specimen collection/handling, submission of specimen other than  nasopharyngeal swab, presence of viral mutation(s) within the areas targeted by this assay, and inadequate number of viral copies(<138 copies/mL). A negative result must be combined with clinical observations, patient history, and epidemiological information. The expected result is Negative.  Fact Sheet for Patients:  BloggerCourse.com  Fact Sheet for Healthcare Providers:  SeriousBroker.it  This test is no t yet approved or cleared by the [place] FDA and  has been authorized for detection and/or diagnosis of SARS-CoV-2 by FDA under an Emergency Use Authorization (EUA). This EUA will remain  in effect (meaning this test can be used) for the duration of the COVID-19 declaration under Section 564(b)(1) of the Act, 21 U.S.C.section 360bbb-3(b)(1), unless the authorization is terminated  or revoked sooner.        Influenza A by PCR POSITIVE (A) NEGATIVE Final   Influenza B by PCR NEGATIVE NEGATIVE Final    Comment: (NOTE) The Xpert Xpress SARS-CoV-2/FLU/RSV plus assay is intended as an aid in the diagnosis of influenza from Nasopharyngeal swab specimens and should not be used as a sole basis for treatment. Nasal washings and aspirates are unacceptable for Xpert Xpress SARS-CoV-2/FLU/RSV testing.  Fact Sheet for Patients: BloggerCourse.com  Fact Sheet for Healthcare Providers: SeriousBroker.it  This test is not yet approved or cleared by the [place] FDA and has been authorized for detection and/or diagnosis of SARS-CoV-2 by FDA under an Emergency Use Authorization (EUA). This EUA will remain in effect (meaning this test can be used) for the duration of the COVID-19 declaration under Section 564(b)(1) of the Act, 21 U.S.C. section 360bbb-3(b)(1), unless the authorization is terminated or revoked.     Resp Syncytial Virus by PCR NEGATIVE NEGATIVE Final    Comment: (NOTE) Fact Sheet for Patients: BloggerCourse.com  Fact Sheet for Healthcare Providers: SeriousBroker.it  This test is not yet approved or cleared by the [place] FDA and has been authorized for detection and/or diagnosis of SARS-CoV-2 by FDA under an Emergency Use Authorization (EUA). This EUA will remain in effect (meaning this test can be used) for the duration of the COVID-19 declaration under Section 564(b)(1) of the Act, 21 U.S.C. section 360bbb-3(b)(1), unless the authorization is terminated or revoked.  Performed at [facility], [address] [place], [place] [address]   Culture, blood (routine x 2)     Status: None   Collection Time: 09/20/23 11:41 AM   Specimen: BLOOD  Result Value Ref Range Status   Specimen Description   Final    BLOOD BLOOD RIGHT ARM Performed at [facility],  [address], [place], [place] [address]    Special Requests   Final    BOTTLES DRAWN AEROBIC AND ANAEROBIC Blood Culture adequate volume Performed at [facility], [address] [place], [place] [address]    Culture   Final    NO GROWTH 5 DAYS Performed at [facility], [address] [place], [place] [address]    Report Status 09/25/2023 FINAL  Final  MRSA Next Gen by PCR, Nasal     Status: None   Collection Time: 09/22/23  7:19 PM   Specimen: Nasal Mucosa; Nasal Swab  Result Value Ref Range Status   MRSA by PCR Next Gen NOT DETECTED NOT DETECTED Final    Comment: (NOTE) The GeneXpert MRSA Assay (FDA approved for NASAL specimens only), is one component of a comprehensive MRSA colonization surveillance program. It is not intended to diagnose MRSA infection nor to guide or monitor treatment for MRSA  infections. Test performance is not FDA approved in patients less than 59 years old. Performed at [facility], [address], [place], [place] [address]      Time coordinating discharge: Over 30 minutes  SIGNED:   Alvira Philips [place], DO  [facility] 09/27/2023, 10:42 AM

## 2023-09-27 NOTE — Plan of Care (Signed)
  Problem: Education: Goal: Knowledge of General Education information will improve Description: Including pain rating scale, medication(s)/side effects and non-pharmacologic comfort measures Outcome: Adequate for Discharge   Problem: Health Behavior/Discharge Planning: Goal: Ability to manage health-related needs will improve Outcome: Adequate for Discharge   Problem: Clinical Measurements: Goal: Ability to maintain clinical measurements within normal limits will improve Outcome: Adequate for Discharge Goal: Will remain free from infection Outcome: Adequate for Discharge Goal: Diagnostic test results will improve Outcome: Adequate for Discharge Goal: Respiratory complications will improve Outcome: Adequate for Discharge Goal: Cardiovascular complication will be avoided Outcome: Adequate for Discharge   Problem: Activity: Goal: Risk for activity intolerance will decrease Outcome: Adequate for Discharge   Problem: Nutrition: Goal: Adequate nutrition will be maintained Outcome: Adequate for Discharge   Problem: Coping: Goal: Level of anxiety will decrease Outcome: Adequate for Discharge   Problem: Elimination: Goal: Will not experience complications related to bowel motility Outcome: Adequate for Discharge Goal: Will not experience complications related to urinary retention Outcome: Adequate for Discharge   Problem: Pain Managment: Goal: General experience of comfort will improve and/or be controlled Outcome: Adequate for Discharge   Problem: Safety: Goal: Ability to remain free from injury will improve Outcome: Adequate for Discharge   Problem: Skin Integrity: Goal: Risk for impaired skin integrity will decrease Outcome: Adequate for Discharge   Problem: Education: Goal: Ability to describe self-care measures that may prevent or decrease complications (Diabetes Survival Skills Education) will improve Outcome: Adequate for Discharge Goal: Individualized Educational  Video(s) Outcome: Adequate for Discharge   Problem: Coping: Goal: Ability to adjust to condition or change in health will improve Outcome: Adequate for Discharge   Problem: Fluid Volume: Goal: Ability to maintain a balanced intake and output will improve Outcome: Adequate for Discharge   Problem: Health Behavior/Discharge Planning: Goal: Ability to manage health-related needs will improve Outcome: Adequate for Discharge   Problem: Health Behavior/Discharge Planning: Goal: Ability to identify and utilize available resources and services will improve Outcome: Adequate for Discharge Goal: Ability to manage health-related needs will improve Outcome: Adequate for Discharge   Problem: Metabolic: Goal: Ability to maintain appropriate glucose levels will improve Outcome: Adequate for Discharge   Problem: Nutritional: Goal: Maintenance of adequate nutrition will improve Outcome: Adequate for Discharge Goal: Progress toward achieving an optimal weight will improve Outcome: Adequate for Discharge   Problem: Skin Integrity: Goal: Risk for impaired skin integrity will decrease Outcome: Adequate for Discharge   Problem: Tissue Perfusion: Goal: Adequacy of tissue perfusion will improve Outcome: Adequate for Discharge   Problem: Acute Rehab PT Goals(only PT should resolve) Goal: Pt Will Go Supine/Side To Sit Outcome: Adequate for Discharge Goal: Patient Will Transfer Sit To/From Stand Outcome: Adequate for Discharge Goal: Pt Will Ambulate Outcome: Adequate for Discharge Goal: Pt Will Go Up/Down Stairs Outcome: Adequate for Discharge   Problem: Acute Rehab OT Goals (only OT should resolve) Goal: Pt. Will Perform Grooming Outcome: Adequate for Discharge Goal: Pt. Will Perform Lower Body Dressing Outcome: Adequate for Discharge Goal: Pt. Will Transfer To Toilet Outcome: Adequate for Discharge

## 2023-10-06 ENCOUNTER — Emergency Department: Payer: Medicare HMO

## 2023-10-06 ENCOUNTER — Other Ambulatory Visit: Payer: Self-pay

## 2023-10-06 ENCOUNTER — Inpatient Hospital Stay (HOSPITAL_COMMUNITY)
Admission: AD | Admit: 2023-10-06 | Discharge: 2023-10-08 | DRG: 101 | Disposition: A | Discharge status: Home / Self Care | Payer: Medicare HMO | Source: Other Acute Inpatient Hospital | Attending: Internal Medicine | Admitting: Internal Medicine

## 2023-10-06 ENCOUNTER — Encounter (HOSPITAL_COMMUNITY): Payer: Self-pay

## 2023-10-06 ENCOUNTER — Emergency Department
Admission: EM | Admit: 2023-10-06 | Discharge: 2023-10-06 | Disposition: A | Discharge status: Other Acute Inpatient Hospital | Payer: Medicare HMO | Attending: Internal Medicine | Admitting: Internal Medicine

## 2023-10-06 ENCOUNTER — Encounter (HOSPITAL_COMMUNITY): Payer: Self-pay | Admitting: Internal Medicine

## 2023-10-06 DIAGNOSIS — Z87442 Personal history of urinary calculi: Secondary | ICD-10-CM

## 2023-10-06 DIAGNOSIS — F32A Depression, unspecified: Secondary | ICD-10-CM | POA: Diagnosis present

## 2023-10-06 DIAGNOSIS — J4489 Other specified chronic obstructive pulmonary disease: Secondary | ICD-10-CM | POA: Diagnosis present

## 2023-10-06 DIAGNOSIS — I5042 Chronic combined systolic (congestive) and diastolic (congestive) heart failure: Secondary | ICD-10-CM | POA: Diagnosis present

## 2023-10-06 DIAGNOSIS — Z8701 Personal history of pneumonia (recurrent): Secondary | ICD-10-CM

## 2023-10-06 DIAGNOSIS — Z79899 Other long term (current) drug therapy: Secondary | ICD-10-CM | POA: Diagnosis not present

## 2023-10-06 DIAGNOSIS — Z8249 Family history of ischemic heart disease and other diseases of the circulatory system: Secondary | ICD-10-CM

## 2023-10-06 DIAGNOSIS — Z96652 Presence of left artificial knee joint: Secondary | ICD-10-CM | POA: Diagnosis present

## 2023-10-06 DIAGNOSIS — E039 Hypothyroidism, unspecified: Secondary | ICD-10-CM | POA: Diagnosis not present

## 2023-10-06 DIAGNOSIS — E119 Type 2 diabetes mellitus without complications: Secondary | ICD-10-CM

## 2023-10-06 DIAGNOSIS — R9431 Abnormal electrocardiogram [ECG] [EKG]: Secondary | ICD-10-CM | POA: Insufficient documentation

## 2023-10-06 DIAGNOSIS — Z95810 Presence of automatic (implantable) cardiac defibrillator: Secondary | ICD-10-CM

## 2023-10-06 DIAGNOSIS — Z91040 Latex allergy status: Secondary | ICD-10-CM

## 2023-10-06 DIAGNOSIS — R569 Unspecified convulsions: Secondary | ICD-10-CM | POA: Diagnosis present

## 2023-10-06 DIAGNOSIS — Z884 Allergy status to anesthetic agent status: Secondary | ICD-10-CM

## 2023-10-06 DIAGNOSIS — Z91041 Radiographic dye allergy status: Secondary | ICD-10-CM | POA: Diagnosis not present

## 2023-10-06 DIAGNOSIS — F419 Anxiety disorder, unspecified: Secondary | ICD-10-CM | POA: Diagnosis present

## 2023-10-06 DIAGNOSIS — E669 Obesity, unspecified: Secondary | ICD-10-CM | POA: Diagnosis present

## 2023-10-06 DIAGNOSIS — I428 Other cardiomyopathies: Secondary | ICD-10-CM | POA: Diagnosis present

## 2023-10-06 DIAGNOSIS — Z79890 Hormone replacement therapy (postmenopausal): Secondary | ICD-10-CM

## 2023-10-06 DIAGNOSIS — G4489 Other headache syndrome: Secondary | ICD-10-CM | POA: Diagnosis not present

## 2023-10-06 DIAGNOSIS — E876 Hypokalemia: Secondary | ICD-10-CM | POA: Diagnosis present

## 2023-10-06 DIAGNOSIS — E890 Postprocedural hypothyroidism: Secondary | ICD-10-CM | POA: Diagnosis present

## 2023-10-06 DIAGNOSIS — Z853 Personal history of malignant neoplasm of breast: Secondary | ICD-10-CM

## 2023-10-06 DIAGNOSIS — G2581 Restless legs syndrome: Secondary | ICD-10-CM | POA: Diagnosis present

## 2023-10-06 DIAGNOSIS — G40409 Other generalized epilepsy and epileptic syndromes, not intractable, without status epilepticus: Secondary | ICD-10-CM | POA: Diagnosis not present

## 2023-10-06 DIAGNOSIS — R404 Transient alteration of awareness: Secondary | ICD-10-CM | POA: Diagnosis not present

## 2023-10-06 DIAGNOSIS — Z6836 Body mass index (BMI) 36.0-36.9, adult: Secondary | ICD-10-CM

## 2023-10-06 DIAGNOSIS — Z95828 Presence of other vascular implants and grafts: Secondary | ICD-10-CM | POA: Diagnosis not present

## 2023-10-06 DIAGNOSIS — N179 Acute kidney failure, unspecified: Secondary | ICD-10-CM | POA: Diagnosis present

## 2023-10-06 DIAGNOSIS — Z883 Allergy status to other anti-infective agents status: Secondary | ICD-10-CM

## 2023-10-06 DIAGNOSIS — Z8619 Personal history of other infectious and parasitic diseases: Secondary | ICD-10-CM

## 2023-10-06 DIAGNOSIS — Z880 Allergy status to penicillin: Secondary | ICD-10-CM | POA: Diagnosis not present

## 2023-10-06 DIAGNOSIS — G4733 Obstructive sleep apnea (adult) (pediatric): Secondary | ICD-10-CM | POA: Diagnosis present

## 2023-10-06 DIAGNOSIS — I5022 Chronic systolic (congestive) heart failure: Secondary | ICD-10-CM | POA: Diagnosis present

## 2023-10-06 DIAGNOSIS — R0689 Other abnormalities of breathing: Secondary | ICD-10-CM | POA: Diagnosis not present

## 2023-10-06 DIAGNOSIS — G43909 Migraine, unspecified, not intractable, without status migrainosus: Secondary | ICD-10-CM | POA: Diagnosis present

## 2023-10-06 DIAGNOSIS — Z8614 Personal history of Methicillin resistant Staphylococcus aureus infection: Secondary | ICD-10-CM

## 2023-10-06 DIAGNOSIS — I110 Hypertensive heart disease with heart failure: Secondary | ICD-10-CM | POA: Diagnosis present

## 2023-10-06 DIAGNOSIS — Z806 Family history of leukemia: Secondary | ICD-10-CM

## 2023-10-06 DIAGNOSIS — Z8719 Personal history of other diseases of the digestive system: Secondary | ICD-10-CM

## 2023-10-06 DIAGNOSIS — Z888 Allergy status to other drugs, medicaments and biological substances status: Secondary | ICD-10-CM

## 2023-10-06 DIAGNOSIS — Z8616 Personal history of COVID-19: Secondary | ICD-10-CM | POA: Diagnosis not present

## 2023-10-06 DIAGNOSIS — G894 Chronic pain syndrome: Secondary | ICD-10-CM | POA: Diagnosis present

## 2023-10-06 DIAGNOSIS — I252 Old myocardial infarction: Secondary | ICD-10-CM

## 2023-10-06 DIAGNOSIS — E66812 Obesity, class 2: Secondary | ICD-10-CM | POA: Diagnosis present

## 2023-10-06 DIAGNOSIS — K219 Gastro-esophageal reflux disease without esophagitis: Secondary | ICD-10-CM | POA: Diagnosis present

## 2023-10-06 DIAGNOSIS — U099 Post covid-19 condition, unspecified: Secondary | ICD-10-CM | POA: Diagnosis present

## 2023-10-06 DIAGNOSIS — Z79891 Long term (current) use of opiate analgesic: Secondary | ICD-10-CM

## 2023-10-06 DIAGNOSIS — E872 Acidosis, unspecified: Secondary | ICD-10-CM | POA: Diagnosis present

## 2023-10-06 DIAGNOSIS — Z90710 Acquired absence of both cervix and uterus: Secondary | ICD-10-CM

## 2023-10-06 DIAGNOSIS — I480 Paroxysmal atrial fibrillation: Secondary | ICD-10-CM | POA: Diagnosis present

## 2023-10-06 DIAGNOSIS — Z803 Family history of malignant neoplasm of breast: Secondary | ICD-10-CM | POA: Diagnosis not present

## 2023-10-06 DIAGNOSIS — I2510 Atherosclerotic heart disease of native coronary artery without angina pectoris: Secondary | ICD-10-CM | POA: Diagnosis present

## 2023-10-06 DIAGNOSIS — Z86012 Personal history of benign carcinoid tumor: Secondary | ICD-10-CM

## 2023-10-06 DIAGNOSIS — Z8679 Personal history of other diseases of the circulatory system: Secondary | ICD-10-CM | POA: Diagnosis present

## 2023-10-06 DIAGNOSIS — G40909 Epilepsy, unspecified, not intractable, without status epilepticus: Secondary | ICD-10-CM

## 2023-10-06 DIAGNOSIS — R4182 Altered mental status, unspecified: Secondary | ICD-10-CM | POA: Diagnosis not present

## 2023-10-06 DIAGNOSIS — R55 Syncope and collapse: Secondary | ICD-10-CM | POA: Diagnosis not present

## 2023-10-06 DIAGNOSIS — M1990 Unspecified osteoarthritis, unspecified site: Secondary | ICD-10-CM | POA: Diagnosis present

## 2023-10-06 DIAGNOSIS — I10 Essential (primary) hypertension: Secondary | ICD-10-CM | POA: Diagnosis not present

## 2023-10-06 DIAGNOSIS — R109 Unspecified abdominal pain: Secondary | ICD-10-CM | POA: Diagnosis present

## 2023-10-06 DIAGNOSIS — R053 Chronic cough: Secondary | ICD-10-CM | POA: Diagnosis present

## 2023-10-06 LAB — COMPREHENSIVE METABOLIC PANEL
ALT: 35 U/L (ref 0–44)
AST: 57 U/L — ABNORMAL HIGH (ref 15–41)
Albumin: 4 g/dL (ref 3.5–5.0)
Alkaline Phosphatase: 83 U/L (ref 38–126)
Anion gap: 16 — ABNORMAL HIGH (ref 5–15)
BUN: 10 mg/dL (ref 6–20)
CO2: 31 mmol/L (ref 22–32)
Calcium: 8.3 mg/dL — ABNORMAL LOW (ref 8.9–10.3)
Chloride: 92 mmol/L — ABNORMAL LOW (ref 98–111)
Creatinine, Ser: 0.98 mg/dL (ref 0.44–1.00)
GFR, Estimated: >60 mL/min (ref >60)
Glucose, Bld: 176 mg/dL — ABNORMAL HIGH (ref 70–99)
Potassium: 2.4 mmol/L — CL (ref 3.5–5.1)
Sodium: 139 mmol/L (ref 135–145)
Total Bilirubin: 0.4 mg/dL (ref 0.0–1.2)
Total Protein: 7.3 g/dL (ref 6.5–8.1)

## 2023-10-06 LAB — CBC WITH DIFFERENTIAL/PLATELET
Abs Immature Granulocytes: 0.07 10*3/uL (ref 0.00–0.07)
Basophils Absolute: 0.1 10*3/uL (ref 0.0–0.1)
Basophils Relative: 1 %
Eosinophils Absolute: 0.1 10*3/uL (ref 0.0–0.5)
Eosinophils Relative: 1 %
HCT: 36.5 % (ref 36.0–46.0)
Hemoglobin: 12.2 g/dL (ref 12.0–15.0)
Immature Granulocytes: 1 %
Lymphocytes Relative: 18 %
Lymphs Abs: 2.1 10*3/uL (ref 0.7–4.0)
MCH: 29.8 pg (ref 26.0–34.0)
MCHC: 33.4 g/dL (ref 30.0–36.0)
MCV: 89.2 fL (ref 80.0–100.0)
Monocytes Absolute: 0.9 10*3/uL (ref 0.1–1.0)
Monocytes Relative: 7 %
Neutro Abs: 8.8 10*3/uL — ABNORMAL HIGH (ref 1.7–7.7)
Neutrophils Relative %: 72 %
Platelets: 285 10*3/uL (ref 150–400)
RBC: 4.09 MIL/uL (ref 3.87–5.11)
RDW: 15.4 % (ref 11.5–15.5)
WBC: 12 10*3/uL — ABNORMAL HIGH (ref 4.0–10.5)
nRBC: 0 % (ref 0.0–0.2)

## 2023-10-06 LAB — TROPONIN I (HIGH SENSITIVITY): Troponin I (High Sensitivity): 6 ng/L (ref <18)

## 2023-10-06 LAB — MAGNESIUM: Magnesium: 2 mg/dL (ref 1.7–2.4)

## 2023-10-06 MED ORDER — INSULIN ASPART 100 UNIT/ML IJ SOLN: 0.0000 [IU] | Freq: Every day | INTRAMUSCULAR | Status: DC

## 2023-10-06 MED ORDER — ENOXAPARIN SODIUM 40 MG/0.4ML IJ SOSY
40.0000 mg | PREFILLED_SYRINGE | INTRAMUSCULAR | Status: DC
  Administered 2023-10-07 – 2023-10-08 (×2): 40 mg via SUBCUTANEOUS
  Filled 2023-10-06 (×3): qty 0.4

## 2023-10-06 MED ORDER — LEVETIRACETAM IN NACL 1500 MG/100ML IV SOLN
1500.0000 mg | Freq: Once | INTRAVENOUS | Status: AC
  Administered 2023-10-06: 1500 mg via INTRAVENOUS
  Filled 2023-10-06: qty 100

## 2023-10-06 MED ORDER — POTASSIUM CHLORIDE 10 MEQ/100ML IV SOLN
10.0000 meq | INTRAVENOUS | Status: AC
Start: 2023-10-06 — End: 2023-10-06
  Administered 2023-10-06 (×3): 10 meq via INTRAVENOUS
  Filled 2023-10-06 (×4): qty 100

## 2023-10-06 MED ORDER — GABAPENTIN 300 MG PO CAPS
300.0000 mg | ORAL_CAPSULE | Freq: Three times a day (TID) | ORAL | Status: DC
  Administered 2023-10-06: 300 mg via ORAL
  Filled 2023-10-06: qty 1

## 2023-10-06 MED ORDER — ACETAMINOPHEN 325 MG PO TABS: 650.0000 mg | ORAL_TABLET | Freq: Four times a day (QID) | ORAL | Status: DC | PRN

## 2023-10-06 MED ORDER — LACOSAMIDE 200 MG PO TABS
200.0000 mg | ORAL_TABLET | Freq: Two times a day (BID) | ORAL | Status: DC
  Administered 2023-10-06 – 2023-10-08 (×4): 200 mg via ORAL
  Filled 2023-10-06 (×4): qty 1

## 2023-10-06 MED ORDER — ORAL CARE MOUTH RINSE: 15.0000 mL | OROMUCOSAL | Status: DC | PRN

## 2023-10-06 MED ORDER — SODIUM CHLORIDE 0.9 % IV BOLUS: 1000.0000 mL | Freq: Once | INTRAVENOUS | Status: DC

## 2023-10-06 MED ORDER — KETOROLAC TROMETHAMINE 15 MG/ML IJ SOLN
15.0000 mg | Freq: Once | INTRAMUSCULAR | Status: AC
  Administered 2023-10-06: 15 mg via INTRAVENOUS
  Filled 2023-10-06: qty 1

## 2023-10-06 MED ORDER — POTASSIUM CHLORIDE CRYS ER 20 MEQ PO TBCR
60.0000 meq | EXTENDED_RELEASE_TABLET | Freq: Once | ORAL | Status: AC
  Administered 2023-10-06: 60 meq via ORAL
  Filled 2023-10-06: qty 3

## 2023-10-06 MED ORDER — NALOXONE HCL 0.4 MG/ML IJ SOLN: 0.4000 mg | INTRAMUSCULAR | Status: DC | PRN

## 2023-10-06 MED ORDER — OXYCODONE HCL 5 MG PO TABS
5.0000 mg | ORAL_TABLET | Freq: Once | ORAL | Status: AC
  Administered 2023-10-06: 5 mg via ORAL
  Filled 2023-10-06: qty 1

## 2023-10-06 MED ORDER — SODIUM CHLORIDE 0.9 % IV BOLUS
500.0000 mL | Freq: Once | INTRAVENOUS | Status: AC
  Administered 2023-10-06: 500 mL via INTRAVENOUS

## 2023-10-06 MED ORDER — ACETAMINOPHEN 325 MG PO TABS
650.0000 mg | ORAL_TABLET | Freq: Four times a day (QID) | ORAL | Status: DC | PRN
  Administered 2023-10-06 – 2023-10-07 (×2): 650 mg via ORAL
  Filled 2023-10-06 (×2): qty 2

## 2023-10-06 MED ORDER — INSULIN ASPART 100 UNIT/ML IJ SOLN
0.0000 [IU] | Freq: Three times a day (TID) | INTRAMUSCULAR | Status: DC
  Administered 2023-10-08: 2 [IU] via SUBCUTANEOUS
  Administered 2023-10-08: 1 [IU] via SUBCUTANEOUS

## 2023-10-06 NOTE — ED Notes (Signed)
[facility] called spoke with Billey Gosling , Pt waiting on bed at [facility]

## 2023-10-06 NOTE — ED Triage Notes (Signed)
Pt brought in by EMS for seizures. Per EMS, pt was unresponsive upon their arrival on scene with slight head shaking and heavy breathing. They administered 2mg  IV Versed at 1400; pt opened eyes and began following commands & answering questions. However, pt had a 2nd seizure en route to hospital and another 2mg  IV versed was given at 1421. Upon arrival to ED, pt was unresponsive - not responding to pain or voice.

## 2023-10-06 NOTE — Consult Note (Signed)
NEUROLOGY CONSULT NOTE   Date of service: October 06, 2023 Patient Name: Brittany Nguyen MRN:  [id number] DOB:  07-17-1966 Chief Complaint: "Seizures" Requesting Provider: Concha Se, MD  History of Present Illness  Brittany Nguyen is a 58 y.o. female with past medical history of hypertension, diabetes, COPD, CAD, CHF status post AICD placement, hypothyroidism, depression, DVT status post IVC filter, carcinoid syndrome, chronic pain who was recently seen at [facility] following increased seizures in the setting of influenza.  She has a complicated neurological history with concerns for generalized epilepsy, as well as possible concerns for psychogenic episodes.  When she went into [facility], she received additional Keppra while she was there but without capturing an episode while on EEG, there was no characterization of the spells while there.  She had her lacosamide increased to 200 twice daily from 150 twice daily, and she also takes gabapentin 300 3 times daily.  Today she has had a total of three spells.  On the initial evaluation by the emergency provider, she was unresponsive, but I was able to get her to wake up, answer questions and follow commands bilaterally.  Following my initial evaluation, I was called back to the room because she was having another episode that had lasted for approximately 5 minutes before my seeing her.  She had side-to-side head shaking, had Bell's phenomenon when I opened her eyes but did not have any response to noxious stimulation including significant nailbed pressure.  When her arm was held aloft over her face, she did avoid hitting her face when it was released.  This stopped after 7 to 8 minutes from onset and she began answering and following commands.   Past History   Past Medical History:  Diagnosis Date   Acute anterolateral wall MI (HCC) 08/2016   Acute respiratory failure (HCC)    AICD (automatic cardioverter/defibrillator) present  03/29/2017   a.) Medtronic Evera MRI XT DR SureScan (SN: [id number])   Anxiety    a.) on BZO PRN (lorazepam)   Arthritis    Asthma 2013   Breast cancer, right (HCC)    C. 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   a.) Bx 02/22/2018 --> NE tumor cells --> AE1/AE3, chromogranin, synaptophysin (+); Ki-67 proliferation rate <1%; b.) repeat Bx 08/24/2018 --> well differentiard NET (G1)   CHF (congestive heart failure) (HCC)    Chronic, continuous use of opioids    a.) has naloxone Rx available   COPD (chronic obstructive pulmonary disease) (HCC)    Coronary artery disease    Depression    Diet-controlled type 2 diabetes mellitus (HCC)    Diverticulitis 2010   DVT (deep venous thrombosis) (HCC)    Endometriosis 1990   Generalized epilepsy (HCC)    a.) on lacosamide; b.) last seizure 07-30-22 in setting of hypokalemia (K+ 2.6)   GERD (gastroesophageal reflux disease)    GIB (gastrointestinal bleeding) 01/08/2018   H/O syncope    HFrEF (heart failure with reduced ejection fraction) (HCC)    a.)TTE 12/09/16: EF 25-30%, dif HK; b.) TTE 02/08/17: EF 25%, dif HK, mild TR, G1DD; c.) TTE 04/25/17: EF 30-35%, mild LVH; d.) TTE 01/30/18: EF 45%, MAC, LAE, mild MR, G1DD; e.) TTE 11/06/2018: EF 25%, sev glob HK, triv MR/TR/PR; f.) TTE 05/12/19: EF 35%, MAC, AoV sclerosis, G1DD; g.) TTE 11/05/19: EF 20%, sev glob HK, triv MR; h.) TTE 06/17/20: EF 40%, mod glob HK, triv MR; I:) TTE 06/14/22:  EF 25-30%, G1DD   History of cardiac catheterization    a.) LHC 10/21/2014: normal coronaries; b.) R/LHC 02/24/2017: normal coronaries, mRA 12, mPA 28, mPCWP 15, CO 8.0, CI 3.8   History of kidney stones    Hx MRSA infection    Hypertension    Hypokalemia    Hypothyroidism    Iron deficiency anemia 03/27/2018   Lump or mass in breast 11/27/2012   a.) Bx 11/27/2012 --> apocrine wall cyst   Migraine    Nausea & vomiting    Neuropathy    Non-ischemic cardiomyopathy (HCC)     a.) TTE 12/09/2016: EF 25-30%, b.) TTE 02/08/2017: EF 25%; c.) Medtronic AICD placed 03/29/2017; d.) TTE 04/25/2017: EF 30-35%; e.) TTE 01/30/2018: EF 45%; f.) TTE 11/06/2018: EF 25%; g.) TTE 05/12/2019: EF 35%; h.) TTE 11/05/2019: EF 20%; I.) TTE 06/17/2020: EF 40%; J.) TTE 06/14/2022: EF 25-30%   Obesity    OSA on CPAP    PAF (paroxysmal atrial fibrillation) (HCC)    Pneumonia    CAP   PONV (postoperative nausea and vomiting)    Post-COVID chronic cough    Postcholecystectomy diarrhea    Prolonged Q-T interval on ECG    RAD (reactive airway disease)    Restless leg    a.) on ropinirole   Seizure (HCC)    Sepsis (HCC)    Unstable angina (HCC)     Past Surgical History:  Procedure Laterality Date   ABDOMINAL HYSTERECTOMY     age 57   BREAST BIOPSY Right 2014   benign   BREAST BIOPSY Right 01/18/2023   [place] Core Bx, coil clip - path pending   BREAST BIOPSY Right 01/18/2023   [place] RT BREAST BX W LOC DEV 1ST LESION IMG BX SPEC US GUIDE 01/18/2023 [facility]   CARDIAC DEFIBRILLATOR PLACEMENT Left 03/29/2017   Procedure: MEDTRONIC CARDIAC DEFIBRILLATOR PLACEMENT (AICD); Location: [facility]; Surgeon: Bonne Dolores, MD   CESAREAN SECTION     CHOLECYSTECTOMY     COLECTOMY     COLONOSCOPY WITH PROPOFOL N/A 02/22/2018   Procedure: COLONOSCOPY WITH PROPOFOL;  Surgeon: Toledo, Boykin Nearing, MD;  Location: [facility];  Service: Gastroenterology;  Laterality: N/A;   ESOPHAGOGASTRODUODENOSCOPY (EGD) WITH PROPOFOL N/A 02/22/2018   Procedure: ESOPHAGOGASTRODUODENOSCOPY (EGD) WITH PROPOFOL;  Surgeon: Toledo, Boykin Nearing, MD;  Location: [facility];  Service: Gastroenterology;  Laterality: N/A;   IVC FILTER INSERTION N/A 08/23/2022   Procedure: IVC FILTER INSERTION;  Surgeon: Renford Dills, MD;  Location: [facility] CV LAB;  Service: Cardiovascular;  Laterality: N/A;   IVC FILTER REMOVAL N/A 03/07/2023   Procedure: IVC FILTER REMOVAL;  Surgeon: Renford Dills, MD;  Location: [facility]AS[facility]  CV LAB;  Service: Cardiovascular;  Laterality: N/A;   KNEE ARTHROPLASTY Left 08/24/2022   Procedure: COMPUTER ASSISTED TOTAL KNEE ARTHROPLASTY;  Surgeon: Donato Heinz, MD;  Location: [facility] ORS;  Service: Orthopedics;  Laterality: Left;   LEFT HEART CATH AND CORONARY ANGIOGRAPHY Left 10/21/2014   Procedure: LEFT HEART CATH AND CORONARY ANGIOGRAPHY; Location: [facility]; Surgeon: Arnoldo Hooker, MD   NASAL SINUS SURGERY  2012   OOPHORECTOMY     RIGHT/LEFT HEART CATH AND CORONARY ANGIOGRAPHY Bilateral 02/24/2017   Procedure: RIGHT/LEFT HEART CATH AND CORONARY ANGIOGRAPHY; Location: [facility]   THYROIDECTOMY      Family History: Family History  Problem Relation Age of Onset   Breast cancer Mother    Cancer Mother 45       ovarian   CAD Mother    Cancer Father  80       brain   CAD Father    Cancer Daughter 31       skin   Breast cancer Maternal Aunt    Cancer Maternal Aunt 34       breast   Leukemia Paternal Grandfather    Breast cancer Cousin     Social History  reports that she has never smoked. She has never used smokeless tobacco. She reports that she does not drink alcohol and does not use drugs.  Allergies  Allergen Reactions   Contrast Media [Iodinated Contrast Media] Shortness Of Breath    Per CT report in 2014 pt had break through contrast reaction with hives and SOB following 13 hour prep. MSY    Ferumoxytol Nausea Only   Iron Sucrose Anaphylaxis   Lidocaine Hives   Metrizamide Shortness Of Breath   Penicillins Hives and Other (See Comments)    IgE = 4 (WNL) on 08/12/2022  ediate rash, facial/tongue/throat swelling, SOB or lightheadedness with hypotension, tachy   Sacubitril-Valsartan     Sz (unclear if new start entresto vs hypoglycemia)   Isosorbide Nitrate Other (See Comments)    Headache   Latex Hives    IgE < 0.10 (WNL) on 08/21/2023   Ondansetron Other (See Comments)    Severe headache   Tizanidine Other (See Comments)    Feels altered   Povidone-Iodine Rash     Blistering rash   Pulmicort [Budesonide] Itching    Medications   Current Facility-Administered Medications:    potassium chloride 10 mEq in 100 mL IVPB, 10 mEq, Intravenous, Q1 Hr x 4, Concha Se, MD, Last Rate: 100 mL/hr at 10/06/23 1603, 10 mEq at 10/06/23 1603  Current Outpatient Medications:    albuterol (VENTOLIN HFA) 108 (90 Base) MCG/ACT inhaler, Inhale 1-2 puffs into the lungs every 6 (six) hours as needed for wheezing or shortness of breath., Disp: , Rfl:    escitalopram (LEXAPRO) 10 MG tablet, Take 10 mg by mouth daily., Disp: , Rfl:    gabapentin (NEURONTIN) 300 MG capsule, Take 1 capsule (300 mg total) by mouth at bedtime. (Patient taking differently: Take 300 mg by mouth 3 (three) times daily.), Disp: 30 capsule, Rfl: 0   guaiFENesin-codeine 100-10 MG/5ML syrup, Take 5 mLs by mouth every 6 (six) hours as needed for cough., Disp: 120 mL, Rfl: 0   lacosamide (VIMPAT) 200 MG TABS tablet, Take 1 tablet (200 mg total) by mouth 2 (two) times daily., Disp: 180 tablet, Rfl: 0   levothyroxine (SYNTHROID) 175 MCG tablet, Take 1 tablet (175 mcg total) by mouth daily at 6 (six) AM., Disp: 90 tablet, Rfl: 0   LORazepam (ATIVAN) 1 MG tablet, Take 1 tablet (1 mg total) by mouth 4 (four) times daily as needed., Disp: 10 tablet, Rfl: 0   [Paused] losartan (COZAAR) 25 MG tablet, Take 12.5 mg by mouth in the morning and at bedtime., Disp: , Rfl:    methadone (DOLOPHINE) 5 MG tablet, Take 1.5 tablets (7.5 mg total) by mouth 2 (two) times daily at 10 am and 4 pm., Disp: , Rfl:    [Paused] metoprolol tartrate (LOPRESSOR) 50 MG tablet, Take 50 mg by mouth 2 (two) times daily., Disp: , Rfl:    oxyCODONE-acetaminophen (PERCOCET) 7.5-325 MG tablet, Take 1 tablet by mouth every 4 (four) hours as needed for severe pain (pain score 7-10)., Disp: , Rfl:    promethazine (PHENERGAN) 25 MG tablet, Take 25 mg by mouth every 4 (four) hours  as needed for nausea or vomiting., Disp: , Rfl:    rOPINIRole (REQUIP) 4  MG tablet, Take 4 mg by mouth at bedtime., Disp: , Rfl:    [Paused] torsemide (DEMADEX) 20 MG tablet, Take 20 mg by mouth 2 (two) times daily., Disp: , Rfl:   Vitals   Vitals:   2023-10-28 1435 10-28-2023 1437  BP: 115/78   Pulse: 96   Resp: (!) 31   Temp: 99.5 F (37.5 C)   TempSrc: Axillary   SpO2: 97%   Weight:  100.4 kg  Height:  5\' 5"  (1.651 m)    Body mass index is 36.83 kg/m.  Physical Exam   Constitutional: Appears well-developed and well-nourished.  Neurologic Examination    Neuro: Mental Status: Patient is able to answer questions with head shakes/nods, able to follow commands, she does not speak. Cranial Nerves: II: Visual Fields are full. Pupils are equal, round, and reactive to light.   III,IV, VI: EOMI without ptosis or diploplia.  VII: Facial movement is symmetric.  Motor: She is able to hold all extremities against gravity Sensory: Once awake, she does endorse symmetric sensation Cerebellar: She does not perform       Labs/Imaging/Neurodiagnostic studies   CBC:  Recent Labs  Lab October 28, 2023 1442  WBC 12.0*  NEUTROABS 8.8*  HGB 12.2  HCT 36.5  MCV 89.2  PLT 285   Basic Metabolic Panel:  Lab Results  Component Value Date   NA 139 October 28, 2023   K 2.4 (LL) 10/28/23   CO2 31 10/28/2023   GLUCOSE 176 (H) Oct 28, 2023   BUN 10 2023/10/28   CREATININE 0.98 10-28-2023   CALCIUM 8.3 (L) Oct 28, 2023   GFRNONAA >60 10/28/23   GFRAA >60 04/28/2020   Lipid Panel:  Lab Results  Component Value Date   LDLCALC 142 (H) 04/25/2017   HgbA1c:  Lab Results  Component Value Date   HGBA1C 6.4 (H) 08/21/2023   Urine Drug Screen:     Component Value Date/Time   LABOPIA POSITIVE (A) 09/19/2022 1258   COCAINSCRNUR NONE DETECTED 09/19/2022 1258   LABBENZ POSITIVE (A) 09/19/2022 1258   AMPHETMU NONE DETECTED 09/19/2022 1258   THCU NONE DETECTED 09/19/2022 1258   LABBARB NONE DETECTED 09/19/2022 1258    Alcohol Level     Component Value Date/Time    ETH <10 11/30/2022 0647   INR  Lab Results  Component Value Date   INR 1.1 09/11/2022   APTT  Lab Results  Component Value Date   APTT 27 09/11/2022   AED levels:  Lab Results  Component Value Date   LEVETIRACETA <2.0 (L) 11/07/2021    MRI brain from 1/20-negative   ASSESSMENT   Brittany Nguyen is a 58 y.o. female with a likely history of generalized epilepsy who presents with recurrent episodes of unclear etiology.  The episode that I witnessed with side-to-side head shaking, avoidance of her face when arm was positioned there, Bell's phenomenon with eyes tightly shut, is highly concerning for a nonepileptic event.  That being said given her history, I would favor characterization of these episodes if we could accomplish this.  For this reason, I am going to recommend transfer to [facility] again for reattempted characterization.  RECOMMENDATIONS  Continue home lacosamide 200 mg twice daily Continue home gabapentin 300 3 times daily Transfer for continuous EEG Neurology will follow while at [facility]  ______________________________________________________________________    Signed, Ritta Slot, MD [facility]

## 2023-10-06 NOTE — ED Provider Notes (Signed)
3:30 PM Assumed care for off going team.   Blood pressure 115/78, pulse 96, temperature 99.5 F (37.5 C), temperature source Axillary, resp. rate (!) 31, height 5\' 5"  (1.651 m), weight 100.4 kg, last menstrual period 01/22/2012, SpO2 97%.  See their HPI for full report but in brief pending re-eval  3:30 PM PT currently resting- no seizure activity. Eyes midline.   Troponin is negative.  CMP shows potassium of 2.4.  Patient will be given 40 of IV potassium.  CBC shows slightly elevated white count.  Patient had some more head movement activity neurology at bedside to evaluate.  They recommend transferring to [facility] for continuous EEG.  He is already discussed with the neurologist over there.  Will discuss the hospitalist for admission.  After a few minutes of the head movement did stop afterwards without intervention.  PT accepted by Dr Thedore Mins.       Concha Se, MD 10/06/23 [phone]

## 2023-10-06 NOTE — H&P (Signed)
History and Physical    Brittany Nguyen WUJ:[id number] DOB: 05-29-1966 DOA: 10/06/2023  PCP: Jerl Mina, MD  Patient coming from: [facility] ED  Chief Complaint: Seizure-like activity  HPI: Brittany Nguyen is a 58 y.o. female with medical history significant of seizure disorder, hypertension, CAD, CHF with last EF 45% status post AICD, paroxysmal A-fib, type 2 diabetes, COPD/asthma, hypothyroidism, depression, anxiety, DVT status post IVC filter, carcinoid tumor of the stomach, chronic pain syndrome, OSA on CPAP, GERD recently admitted 1/18-1/22 for breakthrough seizures.  No seizure or epileptiform discharges noted on EEG.  Brain MRI was negative for acute intracranial abnormality.  Neurology had increased the dose of patient's Vimpat to 200 mg twice daily.  Patient was also diagnosed with influenza A on 1/15 and has completed course of Tamiflu.  She was also treated with prednisone given COPD.  Patient presented to [facility] ED today via EMS due to recurrent seizures.  Per triage note, upon EMS arrival, patient was found unresponsive with slight head shaking and heavy breathing.  They administered 2 mg of IV Versed.  Patient was able to open her eyes and began following commands and answering questions.  However, in route to the hospital, she had a second seizure and was given another 2 mg of IV Versed.  Upon arrival to the ED, patient was unresponsive but later became more awake and alert.  She later had another episode of seizure-like activity in the ED which was witnessed by neurology and felt to be concerning for a nonepileptic event.  CT head negative for acute intracranial findings.  She was sent to [facility] tonight for continuous EEG monitoring..  Neurology recommended continuing home lacosamide 200 mg twice daily and home gabapentin 300 mg 3 times daily.  Labs done in the ED notable for WBC count 12.0, potassium 2.4, chloride 92, glucose 176, calcium 8.3, albumin 4.0, AST 57 and  remainder of LFTs normal, troponin negative, magnesium 2.0.  Chest x-ray showing no active disease.  EKG showing QT prolongation (QTc 568) but no acute ischemic changes.  Patient was given oral and IV potassium (total 90-100 mEq), IV Keppra 1500 mg, IV Toradol 15 mg, and 1.5 L IV fluids in the ED.  Patient states she had a seizure at home.  She remembers seeing some black dots in her vision and then next thing woke up with EMS around her.  She is taking lacosamide 200 mg twice daily as instructed.  She is also taking gabapentin 300 mg at home but she thinks she is taking it twice a day instead of 3 times daily.  She has continued to have a cough since her recent hospitalization.  Denies fevers, chills, shortness of breath, or chest pain.  She reports chronic abdominal pain related to her carcinoid tumor of the stomach for which she is seen at [facility] and takes pain medications at home.  Denies nausea, vomiting, diarrhea, or any urinary symptoms.  She is complaining of headache.  Review of Systems:  Review of Systems  All other systems reviewed and are negative.   Past Medical History:  Diagnosis Date   Acute anterolateral wall MI (HCC) 08/2016   Acute respiratory failure (HCC)    AICD (automatic cardioverter/defibrillator) present 03/29/2017   a.) Medtronic Evera MRI XT DR SureScan (SN: [id number])   Anxiety    a.) on BZO PRN (lorazepam)   Arthritis    Asthma 2013   Breast cancer, right (HCC)    C. difficile colitis 01/14/2018  Carcinoid tumor determined by biopsy of stomach 02/22/2018   a.) Bx 02/22/2018 --> NE tumor cells --> AE1/AE3, chromogranin, synaptophysin (+); Ki-67 proliferation rate <1%; b.) repeat Bx 08/24/2018 --> well differentiard NET (G1)   CHF (congestive heart failure) (HCC)    Chronic, continuous use of opioids    a.) has naloxone Rx available   COPD (chronic obstructive pulmonary disease) (HCC)    Coronary artery disease    Depression    Diet-controlled type 2 diabetes  mellitus (HCC)    Diverticulitis 2010   DVT (deep venous thrombosis) (HCC)    Endometriosis 1990   Generalized epilepsy (HCC)    a.) on lacosamide; b.) last seizure 07-30-22 in setting of hypokalemia (K+ 2.6)   GERD (gastroesophageal reflux disease)    GIB (gastrointestinal bleeding) 01/08/2018   H/O syncope    HFrEF (heart failure with reduced ejection fraction) (HCC)    a.)TTE 12/09/16: EF 25-30%, dif HK; b.) TTE 02/08/17: EF 25%, dif HK, mild TR, G1DD; c.) TTE 04/25/17: EF 30-35%, mild LVH; d.) TTE 01/30/18: EF 45%, MAC, LAE, mild MR, G1DD; e.) TTE 11/06/2018: EF 25%, sev glob HK, triv MR/TR/PR; f.) TTE 05/12/19: EF 35%, MAC, AoV sclerosis, G1DD; g.) TTE 11/05/19: EF 20%, sev glob HK, triv MR; h.) TTE 06/17/20: EF 40%, mod glob HK, triv MR; I:) TTE 06/14/22: EF 25-30%, G1DD   History of cardiac catheterization    a.) LHC 10/21/2014: normal coronaries; b.) R/LHC 02/24/2017: normal coronaries, mRA 12, mPA 28, mPCWP 15, CO 8.0, CI 3.8   History of kidney stones    Hx MRSA infection    Hypertension    Hypokalemia    Hypothyroidism    Iron deficiency anemia 03/27/2018   Lump or mass in breast 11/27/2012   a.) Bx 11/27/2012 --> apocrine wall cyst   Migraine    Nausea & vomiting    Neuropathy    Non-ischemic cardiomyopathy (HCC)    a.) TTE 12/09/2016: EF 25-30%, b.) TTE 02/08/2017: EF 25%; c.) Medtronic AICD placed 03/29/2017; d.) TTE 04/25/2017: EF 30-35%; e.) TTE 01/30/2018: EF 45%; f.) TTE 11/06/2018: EF 25%; g.) TTE 05/12/2019: EF 35%; h.) TTE 11/05/2019: EF 20%; I.) TTE 06/17/2020: EF 40%; J.) TTE 06/14/2022: EF 25-30%   Obesity    OSA on CPAP    PAF (paroxysmal atrial fibrillation) (HCC)    Pneumonia    CAP   PONV (postoperative nausea and vomiting)    Post-COVID chronic cough    Postcholecystectomy diarrhea    Prolonged Q-T interval on ECG    RAD (reactive airway disease)    Restless leg    a.) on ropinirole   Seizure (HCC)    Sepsis (HCC)    Unstable angina (HCC)     Past Surgical  History:  Procedure Laterality Date   ABDOMINAL HYSTERECTOMY     age 15   BREAST BIOPSY Right 2014   benign   BREAST BIOPSY Right 01/18/2023   [place] Core Bx, coil clip - path pending   BREAST BIOPSY Right 01/18/2023   [place] RT BREAST BX W LOC DEV 1ST LESION IMG BX SPEC US GUIDE 01/18/2023 [facility]   CARDIAC DEFIBRILLATOR PLACEMENT Left 03/29/2017   Procedure: MEDTRONIC CARDIAC DEFIBRILLATOR PLACEMENT (AICD); Location: [facility]; Surgeon: Bonne Dolores, MD   CESAREAN SECTION     CHOLECYSTECTOMY     COLECTOMY     COLONOSCOPY WITH PROPOFOL N/A 02/22/2018   Procedure: COLONOSCOPY WITH PROPOFOL;  Surgeon: Toledo, Boykin Nearing, MD;  Location: [facility];  Service: Gastroenterology;  Laterality: N/A;   ESOPHAGOGASTRODUODENOSCOPY (EGD) WITH PROPOFOL N/A 02/22/2018   Procedure: ESOPHAGOGASTRODUODENOSCOPY (EGD) WITH PROPOFOL;  Surgeon: Toledo, Boykin Nearing, MD;  Location: [facility];  Service: Gastroenterology;  Laterality: N/A;   IVC FILTER INSERTION N/A 08/23/2022   Procedure: IVC FILTER INSERTION;  Surgeon: Renford Dills, MD;  Location: [facility];  Service: Cardiovascular;  Laterality: N/A;   IVC FILTER REMOVAL N/A 03/07/2023   Procedure: IVC FILTER REMOVAL;  Surgeon: Renford Dills, MD;  Location: [facility];  Service: Cardiovascular;  Laterality: N/A;   KNEE ARTHROPLASTY Left 08/24/2022   Procedure: COMPUTER ASSISTED TOTAL KNEE ARTHROPLASTY;  Surgeon: Donato Heinz, MD;  Location: [facility];  Service: Orthopedics;  Laterality: Left;   LEFT HEART CATH AND CORONARY ANGIOGRAPHY Left 10/21/2014   Procedure: LEFT HEART CATH AND CORONARY ANGIOGRAPHY; Location: [facility]; Surgeon: Arnoldo Hooker, MD   NASAL SINUS SURGERY  2012   OOPHORECTOMY     RIGHT/LEFT HEART CATH AND CORONARY ANGIOGRAPHY Bilateral 02/24/2017   Procedure: RIGHT/LEFT HEART CATH AND CORONARY ANGIOGRAPHY; Location: [facility]   THYROIDECTOMY       reports that she has never smoked. She has never used smokeless  tobacco. She reports that she does not drink alcohol and does not use drugs.  Allergies  Allergen Reactions   Contrast Media [Iodinated Contrast Media] Shortness Of Breath    Per CT report in 2014 pt had break through contrast reaction with hives and SOB following 13 hour prep. MSY    Ferumoxytol Nausea Only   Iron Sucrose Anaphylaxis   Lidocaine Hives   Metrizamide Shortness Of Breath   Penicillins Hives and Other (See Comments)    IgE = 4 (WNL) on 08/12/2022  ediate rash, facial/tongue/throat swelling, SOB or lightheadedness with hypotension, tachy   Sacubitril-Valsartan     Sz (unclear if new start entresto vs hypoglycemia)   Isosorbide Nitrate Other (See Comments)    Headache   Latex Hives    IgE < 0.10 (WNL) on 08/21/2023   Ondansetron Other (See Comments)    Severe headache   Tizanidine Other (See Comments)    Feels altered   Povidone-Iodine Rash    Blistering rash   Pulmicort [Budesonide] Itching    Family History  Problem Relation Age of Onset   Breast cancer Mother    Cancer Mother 66       ovarian   CAD Mother    Cancer Father 75       brain   CAD Father    Cancer Daughter 28       skin   Breast cancer Maternal Aunt    Cancer Maternal Aunt 34       breast   Leukemia Paternal Grandfather    Breast cancer Cousin     Prior to Admission medications   Medication Sig Start Date End Date Taking? Authorizing Provider  albuterol (VENTOLIN HFA) 108 (90 Base) MCG/ACT inhaler Inhale 1-2 puffs into the lungs every 6 (six) hours as needed for wheezing or shortness of breath.    [provider]  escitalopram (LEXAPRO) 10 MG tablet Take 10 mg by mouth daily. 11/24/22   [provider]  gabapentin (NEURONTIN) 300 MG capsule Take 1 capsule (300 mg total) by mouth at bedtime. Patient taking differently: Take 300 mg by mouth 3 (three) times daily. 09/20/22   Sunnie Nielsen, DO  guaiFENesin-codeine 100-10 MG/5ML syrup Take 5 mLs by mouth every 6 (six)  hours as needed for cough. 09/18/23  Minna Antis, MD  lacosamide (VIMPAT) 200 MG TABS tablet Take 1 tablet (200 mg total) by mouth 2 (two) times daily. 09/27/23 12/26/23  [place], Eric J, DO  levothyroxine (SYNTHROID) 175 MCG tablet Take 1 tablet (175 mcg total) by mouth daily at 6 (six) AM. 09/28/23 12/27/23  [place], Alvira Philips, DO  LORazepam (ATIVAN) 1 MG tablet Take 1 tablet (1 mg total) by mouth 4 (four) times daily as needed. 11/10/21   Arnetha Courser, MD  losartan (COZAAR) 25 MG tablet Take 12.5 mg by mouth in the morning and at bedtime.    [provider]  methadone (DOLOPHINE) 5 MG tablet Take 1.5 tablets (7.5 mg total) by mouth 2 (two) times daily at 10 am and 4 pm. 09/23/23   Delfino Lovett, MD  metoprolol tartrate (LOPRESSOR) 50 MG tablet Take 50 mg by mouth 2 (two) times daily.    [provider]  oxyCODONE-acetaminophen (PERCOCET) 7.5-325 MG tablet Take 1 tablet by mouth every 4 (four) hours as needed for severe pain (pain score 7-10).    [provider]  promethazine (PHENERGAN) 25 MG tablet Take 25 mg by mouth every 4 (four) hours as needed for nausea or vomiting.    [provider]  rOPINIRole (REQUIP) 4 MG tablet Take 4 mg by mouth at bedtime. 01/30/19   [provider]  torsemide (DEMADEX) 20 MG tablet Take 20 mg by mouth 2 (two) times daily.    [provider]    Physical Exam: Vitals:   10/06/23 2047  BP: 103/74  Pulse: 80  Resp: 18  Temp: 99.2 F (37.3 C)  TempSrc: Oral    Physical Exam Vitals reviewed.  Constitutional:      General: She is not in acute distress. HENT:     Head: Normocephalic and atraumatic.  Eyes:     Extraocular Movements: Extraocular movements intact.  Cardiovascular:     Rate and Rhythm: Normal rate and regular rhythm.     Pulses: Normal pulses.  Pulmonary:     Effort: Pulmonary effort is normal. No respiratory distress.     Breath sounds: Normal breath sounds. No wheezing or rales.   Abdominal:     General: Bowel sounds are normal. There is no distension.     Palpations: Abdomen is soft.     Tenderness: There is no abdominal tenderness. There is no guarding.  Musculoskeletal:     Cervical back: Normal range of motion.     Right lower leg: No edema.     Left lower leg: No edema.  Skin:    General: Skin is warm and dry.  Neurological:     General: No focal deficit present.     Mental Status: She is alert and oriented to person, place, and time.     Cranial Nerves: No cranial nerve deficit.     Sensory: No sensory deficit.     Motor: No weakness.     Labs on Admission: I have personally reviewed following labs and imaging studies  CBC: Recent Labs  Lab 10/06/23 1442  WBC 12.0*  NEUTROABS 8.8*  HGB 12.2  HCT 36.5  MCV 89.2  PLT 285   Basic Metabolic Panel: Recent Labs  Lab 10/06/23 1442  NA 139  K 2.4*  CL 92*  CO2 31  GLUCOSE 176*  BUN 10  CREATININE 0.98  CALCIUM 8.3*  MG 2.0   GFR: Estimated Creatinine Clearance: 76.2 mL/min (by C-G formula based on SCr of 0.98 mg/dL). Liver Function Tests:  Recent Labs  Lab 10/06/23 1442  AST 57*  ALT 35  ALKPHOS 83  BILITOT 0.4  PROT 7.3  ALBUMIN 4.0   No results for input(s): "LIPASE", "AMYLASE" in the last 168 hours. No results for input(s): "AMMONIA" in the last 168 hours. Coagulation Profile: No results for input(s): "INR", "PROTIME" in the last 168 hours. Cardiac Enzymes: No results for input(s): "CKTOTAL", "CKMB", "CKMBINDEX", "TROPONINI" in the last 168 hours. BNP (last 3 results) No results for input(s): "PROBNP" in the last 8760 hours. HbA1C: No results for input(s): "HGBA1C" in the last 72 hours. CBG: No results for input(s): "GLUCAP" in the last 168 hours. Lipid Profile: No results for input(s): "CHOL", "HDL", "LDLCALC", "TRIG", "CHOLHDL", "LDLDIRECT" in the last 72 hours. Thyroid Function Tests: No results for input(s): "TSH", "T4TOTAL", "FREET4", "T3FREE", "THYROIDAB" in the  last 72 hours. Anemia Panel: No results for input(s): "VITAMINB12", "FOLATE", "FERRITIN", "TIBC", "IRON", "RETICCTPCT" in the last 72 hours. Urine analysis:    Component Value Date/Time   COLORURINE YELLOW (A) 08/21/2023 0922   APPEARANCEUR HAZY (A) 08/21/2023 0922   APPEARANCEUR Clear 09/02/2014 2312   LABSPEC 1.017 08/21/2023 0922   LABSPEC 1.013 09/02/2014 2312   PHURINE 5.0 08/21/2023 0922   GLUCOSEU NEGATIVE 08/21/2023 0922   GLUCOSEU Negative 09/02/2014 2312   HGBUR NEGATIVE 08/21/2023 0922   BILIRUBINUR NEGATIVE 08/21/2023 0922   BILIRUBINUR Negative 09/02/2014 2312   KETONESUR NEGATIVE 08/21/2023 0922   PROTEINUR NEGATIVE 08/21/2023 0922   NITRITE NEGATIVE 08/21/2023 0922   LEUKOCYTESUR SMALL (A) 08/21/2023 0922   LEUKOCYTESUR Negative 09/02/2014 2312    Radiological Exams on Admission: DG Chest Portable 1 View Result Date: 10/06/2023 CLINICAL DATA:  Altered mental status. EXAM: PORTABLE CHEST 1 VIEW COMPARISON:  September 21, 2023. FINDINGS: Stable cardiomediastinal silhouette. Left-sided defibrillator is unchanged. Visualized lung fields are unremarkable, although substantial portion of left lung is obscured by defibrillator device. Bony thorax is unremarkable. IMPRESSION: No active disease. Electronically Signed   By: Lupita Raider M.D.   On: 10/06/2023 16:30   CT HEAD WO CONTRAST ( ) Result Date: 10/06/2023 CLINICAL DATA:  Mental status change, unknown cause EXAM: CT HEAD WITHOUT CONTRAST TECHNIQUE: Contiguous axial images were obtained from the base of the skull through the vertex without intravenous contrast. RADIATION DOSE REDUCTION: This exam was performed according to the departmental dose-optimization program which includes automated exposure control, adjustment of the mA and/or kV according to patient size and/or use of iterative reconstruction technique. COMPARISON:  09/22/2023 FINDINGS: Brain: No evidence of acute infarction, hemorrhage, hydrocephalus, extra-axial  collection or mass lesion/mass effect. Vascular: No hyperdense vessel or unexpected calcification. Skull: Normal. Negative for fracture or focal lesion. Sinuses/Orbits: Improving paranasal sinus disease. Other: None. IMPRESSION: 1. No acute intracranial findings. 2. Improving paranasal sinus disease. Electronically Signed   By: Duanne Guess D.O.   On: 10/06/2023 16:03    EKG: Independently reviewed.  Sinus rhythm, LAFB, QTc 568.  QT interval has increased compared to previous EKG but no acute ischemic changes.  Assessment and Plan  Recurrent seizure-like activity History of seizure disorder Patient with history of seizure disorder presenting after 3 seizure-like episodes today.  Recently admitted for seizures and dose of lacosamide was increased to 200 mg twice daily.  CT head negative for acute intracranial findings.  Patient was given IV Keppra 1500 mg in the ED.  Neurology had witnessed patient having seizure-like activity at [facility] ED and felt that this was concerning for a nonepileptic event.  Patient was transferred to  [facility] tonight for continuous EEG monitoring, however, neurology is not able to start continuous EEG monitoring tonight as no machines are available. Patient is currently awake and alert, answering questions.  No further seizure-like activity since arrival to the hospital here.  Continue home lacosamide 200 mg twice daily and gabapentin 300 mg 3 times daily per neurology recommendation.  Seizure precautions.  Severe hypokalemia with acute EKG changes/QT prolongation Magnesium is within normal range.  Patient received oral and IV potassium in the ED, repeat labs ordered to check potassium level and continue to replace as needed.  Avoid QT prolonging drugs and follow-up repeat EKG in the morning.  Mild leukocytosis Likely reactive.  No obvious signs of infection.  Repeat CBC in the morning.  CAD Troponin negative and EKG without acute ischemic changes.  Patient is  not endorsing chest pain.  Chronic HFmrEF Last echo done in May 2019 showing EF 45%, grade 1 diastolic dysfunction, mild MR. Patient appears euvolemic on exam.  Diet controlled type 2 diabetes Glucose 176.  Last A1c 6.4 on 08/21/2023.  Placed on sensitive sliding scale insulin ACHS.  OSA Continue nightly CPAP.  Hypertension: Currently normotensive. Paroxysmal A-fib: Currently in sinus rhythm. COPD/asthma: Stable, no signs of acute exacerbation. Hypothyroidism Anxiety and depression Carcinoid tumor of the stomach/ chronic pain syndrome GERD Unable to safely order home medications at this time as pharmacy medication reconciliation is pending.  DVT prophylaxis: Lovenox Code Status: Full Code (discussed with the patient) Family Communication: No family available at this time. Consults called: Neurology Level of care: Progressive Care Unit Admission status: It is my clinical opinion that admission to INPATIENT is reasonable and necessary because of the expectation that this patient will require hospital care that crosses at least 2 midnights to treat this condition based on the medical complexity of the problems presented.  Given the aforementioned information, the predictability of an adverse outcome is felt to be significant.  John Giovanni MD [facility]  If 7PM-7AM, please contact night-coverage [email or web address]  10/06/2023, 9:49 PM

## 2023-10-06 NOTE — ED Provider Notes (Signed)
[facility] Provider Note    Event Date/Time   First MD Initiated Contact with Patient 10/06/23 1438     (approximate)   History   unresponsive   HPI  Brittany Nguyen is a 58 y.o. female who has extensive past medical history including history of generalized epilepsy presents to the ER for evaluation of altered mental status and seizure-like activity starting today.  Had recent admission to the hospital where she has had some increase in seizure burden since recently being sick with the flu.  Has had generalized shaking spell.  Was given Versed with some improvement.  Then and route had another episode.  No reported fevers.  Glucose was 194.     Physical Exam   Triage Vital Signs: ED Triage Vitals  Encounter Vitals Group     BP 10/06/23 1435 115/78     Systolic BP Percentile --      Diastolic BP Percentile --      Pulse Rate 10/06/23 1435 96     Resp 10/06/23 1435 (!) 31     Temp 10/06/23 1435 99.5 F (37.5 C)     Temp Source 10/06/23 1435 Axillary     SpO2 10/06/23 1435 97 %     Weight 10/06/23 1437 221 lb 5.5 oz (100.4 kg)     Height 10/06/23 1437 5\' 5"  (1.651 m)     Head Circumference --      Peak Flow --      Pain Score --      Pain Loc --      Pain Education --      Exclude from Growth Chart --     Most recent vital signs: Vitals:   10/06/23 1435  BP: 115/78  Pulse: 96  Resp: (!) 31  Temp: 99.5 F (37.5 C)  SpO2: 97%     Constitutional: Unresponsive Eyes: Conjunctivae are normal.  Head: Atraumatic. Nose: No congestion/rhinnorhea. Mouth/Throat: Mucous membranes are moist.   Neck: Painless ROM.  Cardiovascular:   Good peripheral circulation. Respiratory: Normal respiratory effort.  No retractions.  Gastrointestinal: Soft and nontender.  Musculoskeletal:  no deformity Neurologic:  MAE spontaneously. No gross focal neurologic deficits are appreciated.  Skin:  Skin is warm, dry and intact. No rash noted. Psychiatric:  Mood and affect are normal. Speech and behavior are normal.    ED Results / Procedures / Treatments   Labs (all labs ordered are listed, but only abnormal results are displayed) Labs Reviewed  CBC WITH DIFFERENTIAL/PLATELET - Abnormal; Notable for the following components:      Result Value   WBC 12.0 (*)    Neutro Abs 8.8 (*)    All other components within normal limits  COMPREHENSIVE METABOLIC PANEL  CBG MONITORING, ED  TROPONIN I (HIGH SENSITIVITY)     EKG  ED ECG REPORT I, Willy Eddy, the attending physician, personally viewed and interpreted this ECG.   Date: 10/06/2023  EKG Time: 14:56  Rate: 95  Rhythm: sinus  Axis: left  Intervals: prolonged qt  ST&T Change: no stemi, no depressions    RADIOLOGY Please see ED Course for my review and interpretation.  I personally reviewed all radiographic images ordered to evaluate for the above acute complaints and reviewed radiology reports and findings.  These findings were personally discussed with the patient.  Please see medical record for radiology report.    PROCEDURES:  Critical Care performed: No  Procedures   MEDICATIONS ORDERED IN ED: Medications  levETIRAcetam (  KEPPRA) IVPB 1500 mg/ 100 mL premix (0 mg Intravenous Stopped 10/06/23 1511)     IMPRESSION / MDM / ASSESSMENT AND PLAN / ED COURSE  I reviewed the triage vital signs and the nursing notes.                              Differential diagnosis includes, but is not limited to, Dehydration, seizure, status, sdh, iph, sepsis, pna, uti, hypoglycemia, cva, drug effect, withdrawal, encephalitis  Patient presenting to the ER for evaluation of symptoms as described above.  Based on symptoms, risk factors and considered above differential, this presenting complaint could reflect a potentially life-threatening illness therefore the patient will be placed on continuous pulse oximetry and telemetry for monitoring.  Laboratory evaluation will be sent to  evaluate for the above complaints.     Clinical Course as of 10/06/23 1512  Fri Oct 06, 2023  1504 Patient reassessed.  Has also been evaluated by neurology at bedside.  Patient now answering questions appropriately and knows her name.  States that she has had 3 seizures today. [PR]  1511 Patient will be signed out oncoming physician pending imaging lab workup and reassessment.  If having any additional seizure-like episodes will need transfer to [facility] for continuous EEG per neurology recommendations.  If remains postictal or slightly drowsy as she currently is may need to be observed overnight.  If the patient returns to her baseline and workup is otherwise reassuring, given her history of longstanding epilepsy, will be appropriate for outpatient follow-up. [PR]    Clinical Course User Index [PR] Willy Eddy, MD     FINAL CLINICAL IMPRESSION(S) / ED DIAGNOSES   Final diagnoses:  Seizure-like activity (HCC)     Rx / DC Orders   ED Discharge Orders     None        Note:  This document was prepared using Dragon voice recognition software and may include unintentional dictation errors.    Willy Eddy, MD 10/06/23 [phone]

## 2023-10-06 NOTE — Plan of Care (Signed)
Brittany Nguyen, is a 58 y.o. female, DOB - Feb 20, 1966, ONG:[id number]  58 y.o. 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 Breakthrough seizure, per Dr Uvaldo Rising and Selina Cooley, transfer to [facility], call Neuro once arrives.   Vitals:   10/06/23 1437 10/06/23 1500 10/06/23 1530 10/06/23 1630  BP:  116/82 110/71 108/79  Pulse:  94 91 80  Resp:  (!) 22 20 10   Temp:      TempSrc:      SpO2:  98% 98% 100%  Weight: 100.4 kg     Height: 5\' 5"  (1.651 m)           Data Review   Micro Results No results found for this or any previous visit (from the past 240 hours).  Radiology Reports DG Chest Portable 1 View Result Date: 10/06/2023 CLINICAL DATA:  Altered mental status. EXAM: PORTABLE CHEST 1 VIEW COMPARISON:  September 21, 2023. FINDINGS: Stable cardiomediastinal silhouette. Left-sided defibrillator is unchanged. Visualized lung fields are unremarkable, although substantial portion of left lung is obscured by defibrillator device. Bony thorax is unremarkable. IMPRESSION: No active disease. Electronically Signed   By: Lupita Raider M.D.   On: 10/06/2023 16:30   CT HEAD WO CONTRAST ( ) Result Date: 10/06/2023 CLINICAL DATA:  Mental status change, unknown cause EXAM: CT HEAD WITHOUT CONTRAST TECHNIQUE: Contiguous axial images were obtained from the base of the skull through the vertex without intravenous contrast. RADIATION DOSE REDUCTION: This exam was performed according to the departmental dose-optimization program which includes automated exposure control, adjustment of the mA and/or kV according to patient size and/or use of iterative reconstruction technique.  COMPARISON:  09/22/2023 FINDINGS: Brain: No evidence of acute infarction, hemorrhage, hydrocephalus, extra-axial collection or mass lesion/mass effect. Vascular: No hyperdense vessel or unexpected calcification. Skull: Normal. Negative for fracture or focal lesion. Sinuses/Orbits: Improving paranasal sinus disease. Other: None. IMPRESSION: 1. No acute intracranial findings. 2. Improving paranasal sinus disease. Electronically Signed   By: Duanne Guess D.O.   On: 10/06/2023 16:03   MR BRAIN WO CONTRAST Result Date: 09/25/2023 CLINICAL DATA:  Seizure, new onset, no history of trauma. EXAM: MRI HEAD WITHOUT CONTRAST TECHNIQUE: Multiplanar, multiecho pulse sequences of the brain and surrounding structures were obtained without intravenous contrast. COMPARISON:  Head CT September 22, 2023. FINDINGS: The study is partially degraded by motion. Brain: No acute infarction, hemorrhage, hydrocephalus, extra-axial collection or mass lesion. The brain parenchyma has normal morphology and signal characteristics. Vascular: Hypoplastic normal flow voids. Hypoplastic vertebrobasilar system. Skull and upper cervical spine: Normal marrow signal. Sinuses/Orbits: Mucosal thickening throughout the paranasal sinuses with opacification of the right maxillary sinus and anterior right ethmoid cells. The orbits are maintained. Left mastoid effusion. Other: None. IMPRESSION: 1. Unremarkable MRI of the brain. 2. Inflammatory paranasal sinus disease. 3. Left mastoid effusion. Electronically Signed   By: Baldemar Lenis M.D.   On: 09/25/2023 15:59   Overnight EEG with video Result Date: 09/24/2023 Charlsie Quest, MD     09/25/2023  9:35 AM Patient Name: Brittany Nguyen MRN: [id number] Epilepsy Attending: Charlsie Quest Referring Physician/Provider: Rejeana Brock, MD Duration: 09/23/2023 1810 to 09/25/2023 1401 Patient history: 57yo F with seizure like activity getting eeg to evaluate for seizure Level of  alertness: Awake/ lethargic AEDs during EEG study: LEV, LCM, GBP Technical aspects: This EEG study was done with scalp electrodes positioned according to the 10-20 International system of electrode placement. Electrical activity was reviewed with band pass filter of 1-70Hz , sensitivity of 7 uV/mm, display speed of 6mm/sec with a 60Hz  notched filter applied as appropriate. EEG data were recorded continuously and digitally stored.  Video monitoring was available and reviewed as appropriate. Description: EEG showed continuous generalized 3 to 6 Hz theta-delta slowing. Hyperventilation and photic stimulation were not performed.  EEG was difficult to interpret between 09/23/2023 2105 to 09/24/2023 0024 and on  09/24/2023 between 0316 to 0931 as patient pulled all electrodes. ABNORMALITY - Continuous slow, generalized IMPRESSION: This study is suggestive of moderate diffuse encephalopathy. No seizures or epileptiform discharges were seen throughout the recording. Priyanka Annabelle Harman   CT HEAD WO CONTRAST ( ) Result Date: 09/22/2023 CLINICAL DATA:  Seizure disorder. EXAM: CT HEAD WITHOUT CONTRAST TECHNIQUE: Contiguous axial images were obtained from the base of the skull through the vertex without intravenous contrast. RADIATION DOSE REDUCTION: This exam was performed according to the departmental dose-optimization program which includes automated exposure control, adjustment of the mA and/or kV according to patient size and/or use of iterative reconstruction technique. COMPARISON:  None Available. FINDINGS: Brain: No mass,hemorrhage or extra-axial collection. Normal appearance of the parenchyma and CSF spaces. Vascular: No hyperdense vessel or unexpected vascular calcification. Skull: The visualized skull base, calvarium and extracranial soft tissues are normal. Sinuses/Orbits: No fluid levels or advanced mucosal thickening of the visualized paranasal sinuses. No mastoid or middle ear effusion. Normal orbits. Other: None.  IMPRESSION: Normal head CT. Electronically Signed   By: Deatra Robinson M.D.   On: 09/22/2023 20:48   EEG adult Result Date: 09/22/2023 Rejeana Brock, MD     09/22/2023  7:32 PM History: 58 year old female being evaluated for possible seizures Sedation: Ativan given earlier Patient State: Awake and asleep Technique: This EEG was acquired with electrodes placed according to the International 10-20 electrode system (including Fp1, Fp2, F3, F4, C3, C4, P3, P4, O1, O2, T3, T4, T5, T6, A1, A2, Fz, Cz, Pz). The following electrodes were missing or displaced: none. Background: The background is relatively well-organized though there is a slow posterior dominant rhythm with a frequency of 6 to 7 Hz.  There is also mild intrusion of irregular slow activity in the delta and theta range.  Selectivity increases with drowsiness and sleep is recorded with symmetric appearing structures.  There is no epileptiform activity seen. Photic stimulation: Physiologic driving is not performed EEG Abnormalities: 1) generalized irregular slow activity 2) slow posterior dominant rhythm Clinical Interpretation: This EEG is consistent with a mild generalized nonspecific cerebral dysfunction (encephalopathy). There was no seizure or seizure predisposition recorded on this study. Please note that lack of epileptiform activity on EEG does not preclude the possibility of epilepsy. Ritta Slot, MD [facility] [phone] If 7pm- 7am, please page neurology on call as listed in AMION.  CT HEAD WO CONTRAST ( ) Result Date: 09/21/2023 CLINICAL DATA:  Mental status change EXAM: CT HEAD WITHOUT CONTRAST TECHNIQUE: Contiguous axial images were obtained from the base of the skull through the vertex without intravenous contrast. RADIATION DOSE REDUCTION: This exam was performed according to the departmental dose-optimization program which includes automated exposure control, adjustment of the mA and/or kV according to  patient size and/or use of iterative reconstruction technique. COMPARISON:  CT brain 05/26/2023 FINDINGS: Brain: No evidence of acute infarction, hemorrhage, hydrocephalus, extra-axial collection  or mass lesion/mass effect. Vascular: No hyperdense vessels.  No unexpected calcification Skull: Normal. Negative for fracture or focal lesion. Sinuses/Orbits: No acute finding. Osteoma left frontal ethmoidal sinuses. Mucosal thickening in the maxillary and ethmoid sinuses Other: None IMPRESSION: Negative non contrasted CT appearance of the brain. Electronically Signed   By: Jasmine Pang M.D.   On: 09/21/2023 17:41   DG Chest 2 View Result Date: 09/21/2023 CLINICAL DATA:  COPD EXAM: CHEST - 2 VIEW COMPARISON:  09/20/2023 FINDINGS: Left-sided implanted cardiac device in stable positioning. The heart size and mediastinal contours are unchanged. Slightly low lung volumes. No focal airspace consolidation, pleural effusion, or pneumothorax. The visualized skeletal structures are unremarkable. IMPRESSION: No active cardiopulmonary disease. Electronically Signed   By: Duanne Guess D.O.   On: 09/21/2023 11:03   DG Chest 2 View Result Date: 09/20/2023 CLINICAL DATA:  Shortness of breath. EXAM: CHEST - 2 VIEW COMPARISON:  Chest radiograph dated June 06, 2023. FINDINGS: The cardiomediastinal silhouette is unchanged. Stable left subclavian dual lead AICD in place. No focal consolidation, pleural effusion, or pneumothorax. No acute osseous abnormality. IMPRESSION: No acute cardiopulmonary findings. Electronically Signed   By: Hart Robinsons M.D.   On: 09/20/2023 13:45    CBC Recent Labs  Lab 10/06/23 1442  WBC 12.0*  HGB 12.2  HCT 36.5  PLT 285  MCV 89.2  MCH 29.8  MCHC 33.4  RDW 15.4  LYMPHSABS 2.1  MONOABS 0.9  EOSABS 0.1  BASOSABS 0.1    Chemistries  Recent Labs  Lab 10/06/23 1442  NA 139  K 2.4*  CL 92*  CO2 31  GLUCOSE 176*  BUN 10  CREATININE 0.98  CALCIUM 8.3*  MG 2.0  AST 57*  ALT  35  ALKPHOS 83  BILITOT 0.4   ------------------------------------------------------------------------------------------------------------------ estimated creatinine clearance is 76.2 mL/min (by C-G formula based on SCr of 0.98 mg/dL). ------------------------------------------------------------------------------------------------------------------ No results for input(s): "HGBA1C" in the last 72 hours. ------------------------------------------------------------------------------------------------------------------ No results for input(s): "CHOL", "HDL", "LDLCALC", "TRIG", "CHOLHDL", "LDLDIRECT" in the last 72 hours. ------------------------------------------------------------------------------------------------------------------ No results for input(s): "TSH", "T4TOTAL", "T3FREE", "THYROIDAB" in the last 72 hours.  Invalid input(s): "FREET3" ------------------------------------------------------------------------------------------------------------------ No results for input(s): "VITAMINB12", "FOLATE", "FERRITIN", "TIBC", "IRON", "RETICCTPCT" in the last 72 hours.  Coagulation profile No results for input(s): "INR", "PROTIME" in the last 168 hours.  No results for input(s): "DDIMER" in the last 72 hours.  Cardiac Enzymes No results for input(s): "CKMB", "TROPONINI", "MYOGLOBIN" in the last 168 hours.  Invalid input(s): "CK" ------------------------------------------------------------------------------------------------------------------ Invalid input(s): "POCBNP"   Signature  Susa Raring M.D on 10/06/2023 at 4:39 PM   -  To page go to [email or web address]

## 2023-10-06 NOTE — ED Notes (Signed)
[facility] called for transfer to [facility] per Dr. Fuller Plan MD, Talked with Benna Dunks.

## 2023-10-07 ENCOUNTER — Encounter (HOSPITAL_COMMUNITY): Payer: Self-pay | Admitting: Internal Medicine

## 2023-10-07 ENCOUNTER — Inpatient Hospital Stay (HOSPITAL_COMMUNITY): Payer: Medicare HMO

## 2023-10-07 DIAGNOSIS — R569 Unspecified convulsions: Secondary | ICD-10-CM | POA: Diagnosis not present

## 2023-10-07 LAB — GLUCOSE, CAPILLARY
Glucose-Capillary: 111 mg/dL — ABNORMAL HIGH (ref 70–99)
Glucose-Capillary: 123 mg/dL — ABNORMAL HIGH (ref 70–99)
Glucose-Capillary: 127 mg/dL — ABNORMAL HIGH (ref 70–99)
Glucose-Capillary: 137 mg/dL — ABNORMAL HIGH (ref 70–99)
Glucose-Capillary: 150 mg/dL — ABNORMAL HIGH (ref 70–99)
Glucose-Capillary: 152 mg/dL — ABNORMAL HIGH (ref 70–99)
Glucose-Capillary: 174 mg/dL — ABNORMAL HIGH (ref 70–99)

## 2023-10-07 LAB — COMPREHENSIVE METABOLIC PANEL
ALT: 27 U/L (ref 0–44)
ALT: 30 U/L (ref 0–44)
AST: 35 U/L (ref 15–41)
AST: 43 U/L — ABNORMAL HIGH (ref 15–41)
Albumin: 3.2 g/dL — ABNORMAL LOW (ref 3.5–5.0)
Albumin: 3.3 g/dL — ABNORMAL LOW (ref 3.5–5.0)
Alkaline Phosphatase: 65 U/L (ref 38–126)
Alkaline Phosphatase: 73 U/L (ref 38–126)
Anion gap: 13 (ref 5–15)
Anion gap: 18 — ABNORMAL HIGH (ref 5–15)
BUN: 10 mg/dL (ref 6–20)
BUN: 14 mg/dL (ref 6–20)
CO2: 27 mmol/L (ref 22–32)
CO2: 30 mmol/L (ref 22–32)
Calcium: 7.8 mg/dL — ABNORMAL LOW (ref 8.9–10.3)
Calcium: 7.9 mg/dL — ABNORMAL LOW (ref 8.9–10.3)
Chloride: 97 mmol/L — ABNORMAL LOW (ref 98–111)
Chloride: 99 mmol/L (ref 98–111)
Creatinine, Ser: 1.16 mg/dL — ABNORMAL HIGH (ref 0.44–1.00)
Creatinine, Ser: 1.24 mg/dL — ABNORMAL HIGH (ref 0.44–1.00)
GFR, Estimated: 51 mL/min — ABNORMAL LOW (ref >60)
GFR, Estimated: 56 mL/min — ABNORMAL LOW (ref >60)
Glucose, Bld: 136 mg/dL — ABNORMAL HIGH (ref 70–99)
Glucose, Bld: 165 mg/dL — ABNORMAL HIGH (ref 70–99)
Potassium: 3.3 mmol/L — ABNORMAL LOW (ref 3.5–5.1)
Potassium: 3.6 mmol/L (ref 3.5–5.1)
Sodium: 140 mmol/L (ref 135–145)
Sodium: 144 mmol/L (ref 135–145)
Total Bilirubin: 0.5 mg/dL (ref 0.0–1.2)
Total Bilirubin: 0.6 mg/dL (ref 0.0–1.2)
Total Protein: 5.8 g/dL — ABNORMAL LOW (ref 6.5–8.1)
Total Protein: 6.4 g/dL — ABNORMAL LOW (ref 6.5–8.1)

## 2023-10-07 LAB — TSH: TSH: 48.205 u[IU]/mL — ABNORMAL HIGH (ref 0.350–4.500)

## 2023-10-07 LAB — CBC WITH DIFFERENTIAL/PLATELET
Abs Immature Granulocytes: 0.03 10*3/uL (ref 0.00–0.07)
Basophils Absolute: 0 10*3/uL (ref 0.0–0.1)
Basophils Relative: 0 %
Eosinophils Absolute: 0.2 10*3/uL (ref 0.0–0.5)
Eosinophils Relative: 3 %
HCT: 29.8 % — ABNORMAL LOW (ref 36.0–46.0)
Hemoglobin: 9.9 g/dL — ABNORMAL LOW (ref 12.0–15.0)
Immature Granulocytes: 1 %
Lymphocytes Relative: 30 %
Lymphs Abs: 1.6 10*3/uL (ref 0.7–4.0)
MCH: 29.6 pg (ref 26.0–34.0)
MCHC: 33.2 g/dL (ref 30.0–36.0)
MCV: 89 fL (ref 80.0–100.0)
Monocytes Absolute: 0.4 10*3/uL (ref 0.1–1.0)
Monocytes Relative: 7 %
Neutro Abs: 3.3 10*3/uL (ref 1.7–7.7)
Neutrophils Relative %: 59 %
Platelets: 199 10*3/uL (ref 150–400)
RBC: 3.35 MIL/uL — ABNORMAL LOW (ref 3.87–5.11)
RDW: 15.7 % — ABNORMAL HIGH (ref 11.5–15.5)
WBC: 5.5 10*3/uL (ref 4.0–10.5)
nRBC: 0 % (ref 0.0–0.2)

## 2023-10-07 LAB — BRAIN NATRIURETIC PEPTIDE: B Natriuretic Peptide: 33.9 pg/mL (ref 0.0–100.0)

## 2023-10-07 LAB — T4, FREE: Free T4: 0.39 ng/dL — ABNORMAL LOW (ref 0.61–1.12)

## 2023-10-07 LAB — CBC
HCT: 33.5 % — ABNORMAL LOW (ref 36.0–46.0)
Hemoglobin: 11.1 g/dL — ABNORMAL LOW (ref 12.0–15.0)
MCH: 29.2 pg (ref 26.0–34.0)
MCHC: 33.1 g/dL (ref 30.0–36.0)
MCV: 88.2 fL (ref 80.0–100.0)
Platelets: 229 10*3/uL (ref 150–400)
RBC: 3.8 MIL/uL — ABNORMAL LOW (ref 3.87–5.11)
RDW: 15.7 % — ABNORMAL HIGH (ref 11.5–15.5)
WBC: 8.5 10*3/uL (ref 4.0–10.5)
nRBC: 0 % (ref 0.0–0.2)

## 2023-10-07 LAB — LACTIC ACID, PLASMA: Lactic Acid, Venous: 2.2 mmol/L (ref 0.5–1.9)

## 2023-10-07 MED ORDER — LEVOTHYROXINE SODIUM 100 MCG PO TABS
175.0000 ug | ORAL_TABLET | Freq: Every day | ORAL | Status: DC
  Administered 2023-10-08: 175 ug via ORAL
  Filled 2023-10-07: qty 1

## 2023-10-07 MED ORDER — GABAPENTIN 300 MG PO CAPS
300.0000 mg | ORAL_CAPSULE | Freq: Three times a day (TID) | ORAL | Status: DC
  Administered 2023-10-07 – 2023-10-08 (×4): 300 mg via ORAL
  Filled 2023-10-07 (×4): qty 1

## 2023-10-07 MED ORDER — MORPHINE SULFATE (PF) 2 MG/ML IV SOLN: 1.0000 mg | Freq: Once | INTRAVENOUS | Status: DC

## 2023-10-07 MED ORDER — POTASSIUM CHLORIDE CRYS ER 20 MEQ PO TBCR
40.0000 meq | EXTENDED_RELEASE_TABLET | Freq: Two times a day (BID) | ORAL | Status: AC
  Administered 2023-10-07 (×2): 40 meq via ORAL
  Filled 2023-10-07 (×2): qty 2

## 2023-10-07 MED ORDER — ALBUMIN HUMAN 25 % IV SOLN
50.0000 g | INTRAVENOUS | Status: AC
  Administered 2023-10-07: 12.5 g via INTRAVENOUS
  Filled 2023-10-07: qty 200

## 2023-10-07 MED ORDER — LORAZEPAM 2 MG/ML IJ SOLN
INTRAMUSCULAR | Status: AC
  Filled 2023-10-07: qty 1

## 2023-10-07 MED ORDER — BUTALBITAL-APAP-CAFFEINE 50-325-40 MG PO TABS
1.0000 | ORAL_TABLET | Freq: Once | ORAL | Status: AC
  Administered 2023-10-07: 1 via ORAL
  Filled 2023-10-07: qty 1

## 2023-10-07 MED ORDER — METHADONE HCL 5 MG PO TABS
7.5000 mg | ORAL_TABLET | Freq: Every day | ORAL | Status: DC | PRN
  Administered 2023-10-07: 7.5 mg via ORAL
  Filled 2023-10-07: qty 2

## 2023-10-07 MED ORDER — LACTATED RINGERS IV BOLUS
500.0000 mL | INTRAVENOUS | Status: AC
  Administered 2023-10-07: 500 mL via INTRAVENOUS

## 2023-10-07 MED ORDER — SODIUM CHLORIDE 0.9 % IV SOLN: INTRAVENOUS | Status: AC

## 2023-10-07 MED ORDER — OXYCODONE-ACETAMINOPHEN 7.5-325 MG PO TABS
1.0000 | ORAL_TABLET | ORAL | Status: DC | PRN
  Administered 2023-10-07 – 2023-10-08 (×5): 1 via ORAL
  Filled 2023-10-07 (×5): qty 1

## 2023-10-07 NOTE — Progress Notes (Signed)
Pt BP 84/58 manually after LR bolus and 50g albumin.  Dr Janalyn Shy made aware, orders not received at this time, will cont to monitor.

## 2023-10-07 NOTE — Progress Notes (Signed)
Pt alert and oriented x 4. Complains of abdominal pain, see MAR. IV is patent and infusing. Continent of bowel and bladder. Ambulated to bathroom this shift. EEG done this shift. Seizure like activity noted and providers called to bedside. Will continue with current plan of care.

## 2023-10-07 NOTE — Progress Notes (Signed)
Patient's RN reported that patient's blood pressure is soft MAP 66.  Patient is asymptomatic.  Given patient has history of CHF with reduced EF  45% giving 500 mL of LR bolus and albumin 50 g.  Tereasa Coop, MD [facility] 10/07/2023, 2:13 AM

## 2023-10-07 NOTE — Progress Notes (Signed)
Patient c/o 8/10 headache and abd pain, not relieved with tylenol or oxycodone.  Dr Janalyn Shy made aware, orders received, will cont to monitor

## 2023-10-07 NOTE — Care Plan (Signed)
Immediately after EEG hookup at around 1251, patient was noted to have an episode during which her head was jerking, feet twitching and not responding. No eeg change was noted. This was a NON epileptic event.  D Stack already aware.  Brittany Nguyen

## 2023-10-07 NOTE — Progress Notes (Signed)
PROGRESS NOTE  Brittany Nguyen ZOX:[id number] DOB: Mar 30, 1966 DOA: 10/06/2023 PCP: Jerl Mina, MD  HPI/Recap of past 24 hours: Brittany Nguyen Headings is a 58 y.o. female with medical history significant of seizure disorder, HTN, CAD, CHF s/p AICD, Afib, DM2, COPD/asthma, hypothyroidism, depression, anxiety, DVT status post IVC filter, carcinoid tumor of the stomach, chronic pain syndrome, OSA on CPAP, GERD recently admitted 1/18-1/22 for breakthrough seizures. Presented again due to recurrent seizures. Had multiple episodes of ?seizure, of which one was witnessed by neurology and felt to be concerning for a nonepileptic event. CT head negative for acute intracranial findings. Neurology consulted, recommend continuous EEG monitoring.  Patient admitted for further management.    Today, patient denies any new complaints, just reports chronic abdominal pain from carcinoid tumor of the stomach.  This afternoon, RN noted patient having seizure like episode with head moving side-to-side.  Neurology notified, appears to be none epileptic.    Assessment/Plan: Principal Problem:   Seizure-like activity (HCC) Active Problems:   Hypokalemia   QT prolongation   CAD (coronary artery disease)   Type 2 diabetes mellitus without complication (HCC)   Seizure disorder (HCC)   Hypothyroidism   OSA on CPAP   Recurrent seizure-like activity, ??nonepileptic spells History of seizure disorder Had an episode of ??seizure on 2/1 CT head negative for acute intracranial findings Neurology consulted, appreciate recs Currently on continuous EEG monitoring Continue home lacosamide 200 mg twice daily and gabapentin 300 mg 3 times daily per neurology Seizure/fall precautions   Hypokalemia QTc prolongation Replace electrolytes as needed Avoid QT prolonging drugs   Mild AKI Gentle IV hydration Daily BMP  Lactic acidosis Currently afebrile with no leukocytosis, no evidence of infection UA pending  collection Chest x-ray unremarkable Possible dehydration, continue gentle hydration   CAD Chronic systolic and diastolic HF BNP 81.1, troponin 6 Troponin negative and EKG without acute ischemic changes Last echo done 10/23 (care everywhere) showed an EF of 25 to 30%, grade 1 DD Repeat ECHO pending   Diet controlled type 2 diabetes Last A1c 6.4 on 08/21/2023 Placed on sensitive sliding scale insulin ACHS  Paroxysmal A-fib Currently in sinus rhythm  COPD/asthma Stable, no signs of acute exacerbation  Hypothyroidism TSH elevated at 48, free T4 low at 0.39 May need to adjust Synthroid Outpatient follow-up  Hypertension BP stable PTA BP meds have been held prior to this admission    OSA Continue nightly CPAP  Anxiety and depression Resume Lexapro once able  Carcinoid tumor of the stomach/ chronic pain syndrome Takes methadone, Percocet  Obesity Lifestyle modification advised    Estimated body mass index is 36.83 kg/m as calculated from the following:   Height as of this encounter: 5\' 5"  (1.651 m).   Weight as of this encounter: 100.4 kg.     Code Status: Full  Family Communication: None at bedside  Disposition Plan: Status is: Inpatient Remains inpatient appropriate because: Level of care      Consultants: Neurology  Procedures: None  Antimicrobials: None  DVT prophylaxis: Lovenox   Objective: Vitals:   10/07/23 0415 10/07/23 0541 10/07/23 0842 10/07/23 1254  BP: 90/63 (!) 84/58 106/61 (!) 147/132  Pulse: 73     Resp:      Temp:   98.1 F (36.7 C)   TempSrc:   Oral   SpO2:   94% 97%  Weight:      Height:       No intake or output data in the 24 hours ending 10/07/23  1418 Filed Weights   10/06/23 2241  Weight: 100.4 kg    Exam: General: NAD  Cardiovascular: S1, S2 present Respiratory: CTAB Abdomen: Soft, nontender, nondistended, bowel sounds present Musculoskeletal: No bilateral pedal edema noted Skin: Normal Psychiatry:  Normal mood   Data Reviewed: CBC: Recent Labs  Lab 10/06/23 1442 10/06/23 2336 10/07/23 0837  WBC 12.0* 8.5 5.5  NEUTROABS 8.8*  --  3.3  HGB 12.2 11.1* 9.9*  HCT 36.5 33.5* 29.8*  MCV 89.2 88.2 89.0  PLT 285 229 199   Basic Metabolic Panel: Recent Labs  Lab 10/06/23 1442 10/06/23 2336 10/07/23 0837  NA 139 140 144  K 2.4* 3.6 3.3*  CL 92* 97* 99  CO2 31 30 27   GLUCOSE 176* 165* 136*  BUN 10 10 14   CREATININE 0.98 1.16* 1.24*  CALCIUM 8.3* 7.8* 7.9*  MG 2.0  --   --    GFR: Estimated Creatinine Clearance: 60.2 mL/min (A) (by C-G formula based on SCr of 1.24 mg/dL (H)). Liver Function Tests: Recent Labs  Lab 10/06/23 1442 10/06/23 2336 10/07/23 0837  AST 57* 43* 35  ALT 35 30 27  ALKPHOS 83 73 65  BILITOT 0.4 0.6 0.5  PROT 7.3 6.4* 5.8*  ALBUMIN 4.0 3.2* 3.3*   No results for input(s): "LIPASE", "AMYLASE" in the last 168 hours. No results for input(s): "AMMONIA" in the last 168 hours. Coagulation Profile: No results for input(s): "INR", "PROTIME" in the last 168 hours. Cardiac Enzymes: No results for input(s): "CKTOTAL", "CKMB", "CKMBINDEX", "TROPONINI" in the last 168 hours. BNP (last 3 results) No results for input(s): "PROBNP" in the last 8760 hours. HbA1C: No results for input(s): "HGBA1C" in the last 72 hours. CBG: Recent Labs  Lab 10/07/23 0010 10/07/23 0607 10/07/23 0837 10/07/23 1244 10/07/23 1401  GLUCAP 174* 127* 150* 123* 111*   Lipid Profile: No results for input(s): "CHOL", "HDL", "LDLCALC", "TRIG", "CHOLHDL", "LDLDIRECT" in the last 72 hours. Thyroid Function Tests: Recent Labs    10/07/23 0824 10/07/23 0837  TSH 48.205*  --   FREET4  --  0.39*   Anemia Panel: No results for input(s): "VITAMINB12", "FOLATE", "FERRITIN", "TIBC", "IRON", "RETICCTPCT" in the last 72 hours. Urine analysis:    Component Value Date/Time   COLORURINE YELLOW (A) 08/21/2023 0922   APPEARANCEUR HAZY (A) 08/21/2023 0922   APPEARANCEUR Clear  09/02/2014 2312   LABSPEC 1.017 08/21/2023 0922   LABSPEC 1.013 09/02/2014 2312   PHURINE 5.0 08/21/2023 0922   GLUCOSEU NEGATIVE 08/21/2023 0922   GLUCOSEU Negative 09/02/2014 2312   HGBUR NEGATIVE 08/21/2023 0922   BILIRUBINUR NEGATIVE 08/21/2023 0922   BILIRUBINUR Negative 09/02/2014 2312   KETONESUR NEGATIVE 08/21/2023 0922   PROTEINUR NEGATIVE 08/21/2023 0922   NITRITE NEGATIVE 08/21/2023 0922   LEUKOCYTESUR SMALL (A) 08/21/2023 0922   LEUKOCYTESUR Negative 09/02/2014 2312   Sepsis Labs: @LABRCNTIP (procalcitonin:4,lacticidven:4)  )No results found for this or any previous visit (from the past 240 hours).    Studies: DG Chest Portable 1 View Result Date: 10/06/2023 CLINICAL DATA:  Altered mental status. EXAM: PORTABLE CHEST 1 VIEW COMPARISON:  September 21, 2023. FINDINGS: Stable cardiomediastinal silhouette. Left-sided defibrillator is unchanged. Visualized lung fields are unremarkable, although substantial portion of left lung is obscured by defibrillator device. Bony thorax is unremarkable. IMPRESSION: No active disease. Electronically Signed   By: Lupita Raider M.D.   On: 10/06/2023 16:30   CT HEAD WO CONTRAST ( ) Result Date: 10/06/2023 CLINICAL DATA:  Mental status change, unknown cause EXAM: CT HEAD  WITHOUT CONTRAST TECHNIQUE: Contiguous axial images were obtained from the base of the skull through the vertex without intravenous contrast. RADIATION DOSE REDUCTION: This exam was performed according to the departmental dose-optimization program which includes automated exposure control, adjustment of the mA and/or kV according to patient size and/or use of iterative reconstruction technique. COMPARISON:  09/22/2023 FINDINGS: Brain: No evidence of acute infarction, hemorrhage, hydrocephalus, extra-axial collection or mass lesion/mass effect. Vascular: No hyperdense vessel or unexpected calcification. Skull: Normal. Negative for fracture or focal lesion. Sinuses/Orbits: Improving  paranasal sinus disease. Other: None. IMPRESSION: 1. No acute intracranial findings. 2. Improving paranasal sinus disease. Electronically Signed   By: Duanne Guess D.O.   On: 10/06/2023 16:03    Scheduled Meds:  enoxaparin (LOVENOX) injection  40 mg Subcutaneous Q24H   gabapentin  300 mg Oral TID   insulin aspart  0-5 Units Subcutaneous QHS   insulin aspart  0-9 Units Subcutaneous TID WC   lacosamide  200 mg Oral BID   LORazepam        Continuous Infusions:   LOS: 1 day     Briant Cedar, MD [facility]  If 7PM-7AM, please contact night-coverage [email or web address] 10/07/2023, 2:18 PM

## 2023-10-07 NOTE — Plan of Care (Signed)

## 2023-10-07 NOTE — Progress Notes (Signed)
NEUROLOGY CONSULT FOLLOW UP NOTE   Date of service: October 07, 2023 Patient Name: Brittany Nguyen MRN:  [id number] DOB:  08-11-1966  Interval Hx/subjective   Multiple episodes of waxing/waning side to side head movement with unresponsiveness captured without ictal correlate.  Vitals   Vitals:   10/07/23 0415 10/07/23 0541 10/07/23 0842 10/07/23 1254  BP: 90/63 (!) 84/58 106/61 (!) 147/132  Pulse: 73     Resp:      Temp:   98.1 F (36.7 C)   TempSrc:   Oral   SpO2:   94% 97%  Weight:      Height:         Body mass index is 36.83 kg/m.  Physical Exam   Constitutional: Appears well-developed and well-nourished.   Neurologic Examination   Neuro: Mental Status: Patient is able to answer questions with head shakes/nods, able to follow commands, she does not speak. Cranial Nerves: II: Visual Fields are full. Pupils are equal, round, and reactive to light.   III,IV, VI: EOMI without ptosis or diploplia.  VII: Facial movement is symmetric.  Motor: She is able to hold all extremities against gravity Sensory: Once awake, she does endorse symmetric sensation Cerebellar: She does not perform  While I was in the room patient had multiple episodes of side-to-side head shaking with unresponsiveness.  There was no change in bedside EEG which showed a diffusely slow waking rhythm throughout.  Medications  Current Facility-Administered Medications:    0.9 %  sodium chloride infusion, , Intravenous, Continuous, Briant Cedar, MD, Last Rate: 75 mL/hr at 10/07/23 1453, New Bag at 10/07/23 1453   acetaminophen (TYLENOL) tablet 650 mg, 650 mg, Oral, Q6H PRN, John Giovanni, MD, 650 mg at 10/07/23 1012   enoxaparin (LOVENOX) injection 40 mg, 40 mg, Subcutaneous, Q24H, John Giovanni, MD, 40 mg at 10/07/23 0846   gabapentin (NEURONTIN) capsule 300 mg, 300 mg, Oral, TID, Briant Cedar, MD, 300 mg at 10/07/23 0845   insulin aspart (novoLOG) injection 0-5 Units,  0-5 Units, Subcutaneous, QHS, [place], Ulyess Blossom, MD   insulin aspart (novoLOG) injection 0-9 Units, 0-9 Units, Subcutaneous, TID WC, John Giovanni, MD   lacosamide (VIMPAT) tablet 200 mg, 200 mg, Oral, BID, John Giovanni, MD, 200 mg at 10/07/23 0845   [START ON 10/08/2023] levothyroxine (SYNTHROID) tablet 175 mcg, 175 mcg, Oral, Q0600, Briant Cedar, MD   LORazepam (ATIVAN) 2 MG/ML injection, , , ,    methadone (DOLOPHINE) tablet 7.5 mg, 7.5 mg, Oral, Daily PRN, Briant Cedar, MD, 7.5 mg at 10/07/23 1129   naloxone (NARCAN) injection 0.4 mg, 0.4 mg, Intravenous, PRN, John Giovanni, MD   Oral care mouth rinse, 15 mL, Mouth Rinse, PRN, John Giovanni, MD   oxyCODONE-acetaminophen (PERCOCET) 7.5-325 MG per tablet 1 tablet, 1 tablet, Oral, Q4H PRN, Briant Cedar, MD, 1 tablet at 10/07/23 1130   potassium chloride SA (KLOR-CON M) CR tablet 40 mEq, 40 mEq, Oral, BID, Briant Cedar, MD  Labs and Diagnostic Imaging   CBC:  Recent Labs  Lab 10/06/23 1442 10/06/23 2336 10/07/23 0837  WBC 12.0* 8.5 5.5  NEUTROABS 8.8*  --  3.3  HGB 12.2 11.1* 9.9*  HCT 36.5 33.5* 29.8*  MCV 89.2 88.2 89.0  PLT 285 229 199    Basic Metabolic Panel:  Lab Results  Component Value Date   NA 144 10/07/2023   K 3.3 (L) 10/07/2023   CO2 27 10/07/2023   GLUCOSE 136 (H) 10/07/2023   BUN 14  10/07/2023   CREATININE 1.24 (H) 10/07/2023   CALCIUM 7.9 (L) 10/07/2023   GFRNONAA 51 (L) 10/07/2023   GFRAA >60 04/28/2020   Lipid Panel:  Lab Results  Component Value Date   LDLCALC 142 (H) 04/25/2017   HgbA1c:  Lab Results  Component Value Date   HGBA1C 6.4 (H) 08/21/2023   Urine Drug Screen:     Component Value Date/Time   LABOPIA POSITIVE (A) 09/19/2022 1258   COCAINSCRNUR NONE DETECTED 09/19/2022 1258   LABBENZ POSITIVE (A) 09/19/2022 1258   AMPHETMU NONE DETECTED 09/19/2022 1258   THCU NONE DETECTED 09/19/2022 1258   LABBARB NONE DETECTED 09/19/2022 1258     Alcohol Level     Component Value Date/Time   ETH <10 11/30/2022 0647   INR  Lab Results  Component Value Date   INR 1.1 09/11/2022   APTT  Lab Results  Component Value Date   APTT 27 09/11/2022   AED levels:  Lab Results  Component Value Date   LEVETIRACETA <2.0 (L) 11/07/2021    cEEG: multiple episodes of waxing/waning side to side head movement with unresponsiveness captured without ictal correlate  Assessment   Brittany Nguyen is a 58 y.o. 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  She had multiple typical spells earlier today which were captured on EEG and showed no ictal correlate.  These were nonepileptic events.  She subsequently took off her own EEG leads.  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  If she has further events with the waxing/waning side-to-side head movement with unresponsiveness these have been characterized as nonepileptic and do not require treatment with Ativan or escalation of AED therapy.  Recommendations   - No indication for further cEEG, OK to discontinue - Continue home vimpat 200mg  bid and gabapentin 300mg  tid - Neurology to sign off, please re-engage if additional neurologic concerns arise ______________________________________________________________________   Signed, Jefferson Fuel, MD [facility]

## 2023-10-07 NOTE — Progress Notes (Signed)
 LTM EEG hooked up and running - no initial skin breakdown - push button tested - [facility] monitoring.

## 2023-10-07 NOTE — Progress Notes (Signed)
 LTM EEG disconnected - no skin breakdown at [facility].

## 2023-10-08 ENCOUNTER — Other Ambulatory Visit (HOSPITAL_COMMUNITY): Payer: Medicare HMO

## 2023-10-08 DIAGNOSIS — R569 Unspecified convulsions: Secondary | ICD-10-CM | POA: Diagnosis not present

## 2023-10-08 LAB — GLUCOSE, CAPILLARY
Glucose-Capillary: 139 mg/dL — ABNORMAL HIGH (ref 70–99)
Glucose-Capillary: 164 mg/dL — ABNORMAL HIGH (ref 70–99)

## 2023-10-08 LAB — COMPREHENSIVE METABOLIC PANEL
ALT: 24 U/L (ref 0–44)
AST: 29 U/L (ref 15–41)
Albumin: 3 g/dL — ABNORMAL LOW (ref 3.5–5.0)
Alkaline Phosphatase: 66 U/L (ref 38–126)
Anion gap: 9 (ref 5–15)
BUN: 14 mg/dL (ref 6–20)
CO2: 28 mmol/L (ref 22–32)
Calcium: 8.5 mg/dL — ABNORMAL LOW (ref 8.9–10.3)
Chloride: 106 mmol/L (ref 98–111)
Creatinine, Ser: 0.92 mg/dL (ref 0.44–1.00)
GFR, Estimated: >60 mL/min (ref >60)
Glucose, Bld: 140 mg/dL — ABNORMAL HIGH (ref 70–99)
Potassium: 4 mmol/L (ref 3.5–5.1)
Sodium: 143 mmol/L (ref 135–145)
Total Bilirubin: 0.3 mg/dL (ref 0.0–1.2)
Total Protein: 5.6 g/dL — ABNORMAL LOW (ref 6.5–8.1)

## 2023-10-08 LAB — CBC WITH DIFFERENTIAL/PLATELET
Abs Immature Granulocytes: 0.04 10*3/uL (ref 0.00–0.07)
Basophils Absolute: 0 10*3/uL (ref 0.0–0.1)
Basophils Relative: 1 %
Eosinophils Absolute: 0.2 10*3/uL (ref 0.0–0.5)
Eosinophils Relative: 4 %
HCT: 30.3 % — ABNORMAL LOW (ref 36.0–46.0)
Hemoglobin: 9.9 g/dL — ABNORMAL LOW (ref 12.0–15.0)
Immature Granulocytes: 1 %
Lymphocytes Relative: 31 %
Lymphs Abs: 1.3 10*3/uL (ref 0.7–4.0)
MCH: 29.2 pg (ref 26.0–34.0)
MCHC: 32.7 g/dL (ref 30.0–36.0)
MCV: 89.4 fL (ref 80.0–100.0)
Monocytes Absolute: 0.3 10*3/uL (ref 0.1–1.0)
Monocytes Relative: 7 %
Neutro Abs: 2.5 10*3/uL (ref 1.7–7.7)
Neutrophils Relative %: 56 %
Platelets: 197 10*3/uL (ref 150–400)
RBC: 3.39 MIL/uL — ABNORMAL LOW (ref 3.87–5.11)
RDW: 15.3 % (ref 11.5–15.5)
WBC: 4.3 10*3/uL (ref 4.0–10.5)
nRBC: 0 % (ref 0.0–0.2)

## 2023-10-08 MED ORDER — GABAPENTIN 300 MG PO CAPS: 300.0000 mg | ORAL_CAPSULE | Freq: Three times a day (TID) | ORAL | Status: AC

## 2023-10-08 NOTE — Plan of Care (Signed)

## 2023-10-08 NOTE — Discharge Summary (Signed)
Physician Discharge Summary   Patient: Brittany Nguyen MRN: [id number] DOB: 19-Jan-1966  Admit date:     10/06/2023  Discharge date: 10/08/23  Discharge Physician: Briant Cedar   PCP: Jerl Mina, MD   Recommendations at discharge:   Follow-up with PCP Follow-up with neurology  Discharge Diagnoses: Principal Problem:   Seizure-like activity (HCC) Active Problems:   Hypokalemia   QT prolongation   CAD (coronary artery disease)   Type 2 diabetes mellitus without complication (HCC)   Seizure disorder (HCC)   Hypothyroidism   OSA on CPAP    Hospital Course: Brittany Nguyen is a 58 y.o. female with medical history significant of seizure disorder, HTN, CAD, CHF s/p AICD, Afib, DM2, COPD/asthma, hypothyroidism, depression, anxiety, DVT status post IVC filter, carcinoid tumor of the stomach, chronic pain syndrome, OSA on CPAP, GERD recently admitted 1/18-1/22 for breakthrough seizures. Presented again due to recurrent seizures. Had multiple episodes of ?seizure, of which one was witnessed by neurology and felt to be concerning for a nonepileptic event. CT head negative for acute intracranial findings. Neurology consulted, recommend continuous EEG monitoring.  Patient admitted for further management.    Today, patient denies any new complaints.  No further nonepileptic episodes since yesterday.  Stable to discharge home to follow-up with PCP, neurology.  Discussed extensively about the need to be compliant with medications.   Assessment and Plan:  Recurrent seizure-like activity, ?? 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, with nonepileptic events Continue home lacosamide 200 mg twice daily and gabapentin 300 mg 3 times daily per neurology Follow-up with neurology/PCP   Hypokalemia QTc prolongation-improved Replaced  electrolytes as needed Avoid QT prolonging drugs   Mild AKI Resolved S/p gentle IV hydration   Lactic acidosis Currently afebrile with no leukocytosis, no evidence of infection, denies any dysuria Chest x-ray unremarkable Possible mild dehydration, s/p IV fluid   CAD Chronic systolic and diastolic HF Chest pain-free, denies shortness of breath BNP 33.9, troponin 6 Troponin negative and EKG without acute ischemic changes Last echo done 10/23 (care everywhere) showed an EF of 25 to 30%, grade 1 DD Follow-up with outpatient cardiology   Diet controlled type 2 diabetes Last A1c 6.4 on 08/21/2023   Paroxysmal A-fib Currently in sinus rhythm   COPD/asthma Stable, no signs of acute exacerbation   Hypothyroidism TSH elevated at 48, free T4 low at 0.39 May need to adjust Synthroid Outpatient follow-up   Hypertension BP stable PTA BP meds have been held prior to this admission   OSA Continue nightly CPAP   Anxiety and depression Resume Lexapro   Carcinoid tumor of the stomach/ chronic pain syndrome Takes methadone, Percocet   Obesity Lifestyle modification advised        Pain control - [place] Controlled Substance Reporting System database was reviewed. and patient was instructed, not to drive, operate heavy machinery, perform activities at heights, swimming or participation in water activities or provide baby-sitting services while on Pain, Sleep and Anxiety Medications; until their outpatient Physician has advised to do so again. Also recommended to not to take more than prescribed Pain, Sleep and Anxiety Medications.    Consultants: Neurology Procedures performed: Continuous EEG monitoring Disposition: Home Diet recommendation:  Cardiac and Carb modified diet   DISCHARGE MEDICATION: Allergies as of 10/08/2023       Reactions   Contrast Media [iodinated Contrast Media] Shortness Of Breath   Per CT  report in 2014 pt had break through contrast reaction  with hives and SOB following 13 hour prep. MSY    Ferumoxytol Nausea Only   Iron Sucrose Anaphylaxis   Lidocaine Hives   Metrizamide Shortness Of Breath   Penicillins Hives, Other (See Comments)   IgE = 4 (WNL) on 08/12/2022 ediate rash, facial/tongue/throat swelling, SOB or lightheadedness with hypotension, tachy   Sacubitril-valsartan    Sz (unclear if new start entresto vs hypoglycemia)   Isosorbide Nitrate Other (See Comments)   Headache   Latex Hives   IgE < 0.10 (WNL) on 08/21/2023   Ondansetron Other (See Comments)   Severe headache   Tizanidine Other (See Comments)   Feels altered   Povidone-iodine Rash   Blistering rash   Pulmicort [budesonide] Itching        Medication List     PAUSE taking these medications    losartan 25 MG tablet Wait to take this until your doctor or other care provider tells you to start again. Commonly known as: COZAAR Take 12.5 mg by mouth in the morning and at bedtime.   metoprolol tartrate 50 MG tablet Wait to take this until your doctor or other care provider tells you to start again. Commonly known as: LOPRESSOR Take 50 mg by mouth 2 (two) times daily.   torsemide 20 MG tablet Wait to take this until your doctor or other care provider tells you to start again. Commonly known as: DEMADEX Take 20 mg by mouth 2 (two) times daily.       TAKE these medications    escitalopram 10 MG tablet Commonly known as: LEXAPRO Take 10 mg by mouth daily.   gabapentin 300 MG capsule Commonly known as: NEURONTIN Take 1 capsule (300 mg total) by mouth 3 (three) times daily.   lacosamide 200 MG Tabs tablet Commonly known as: VIMPAT Take 1 tablet (200 mg total) by mouth 2 (two) times daily.   levothyroxine 175 MCG tablet Commonly known as: SYNTHROID Take 1 tablet (175 mcg total) by mouth daily at 6 (six) AM.   LORazepam 1 MG tablet Commonly known as: ATIVAN Take 1 tablet (1 mg total) by mouth 4 (four) times daily as needed.    methadone 5 MG tablet Commonly known as: DOLOPHINE Take 1.5 tablets (7.5 mg total) by mouth 2 (two) times daily at 10 am and 4 pm. What changed: additional instructions   oxyCODONE-acetaminophen 7.5-325 MG tablet Commonly known as: PERCOCET Take 1 tablet by mouth every 4 (four) hours as needed for severe pain (pain score 7-10).   promethazine 25 MG tablet Commonly known as: PHENERGAN Take 25 mg by mouth every 4 (four) hours as needed for nausea or vomiting.   rOPINIRole 4 MG tablet Commonly known as: REQUIP Take 4 mg by mouth at bedtime.        Follow-up Information     Jerl Mina, MD. Schedule an appointment as soon as possible for a visit.   Specialty: Family Medicine Contact information: [address] [place] [address] [phone]                Discharge Exam: Ceasar Mons Weights   10/06/23 2241  Weight: 100.4 kg   General: NAD  Cardiovascular: S1, S2 present Respiratory: CTAB Abdomen: Soft, nontender, nondistended, bowel sounds present Musculoskeletal: No bilateral pedal edema noted Skin: Normal Psychiatry: Normal mood   Condition at discharge: stable  The results of significant diagnostics from this hospitalization (including imaging, microbiology, ancillary and  laboratory) are listed below for reference.   Imaging Studies: DG Chest Portable 1 View Result Date: 10/06/2023 CLINICAL DATA:  Altered mental status. EXAM: PORTABLE CHEST 1 VIEW COMPARISON:  September 21, 2023. FINDINGS: Stable cardiomediastinal silhouette. Left-sided defibrillator is unchanged. Visualized lung fields are unremarkable, although substantial portion of left lung is obscured by defibrillator device. Bony thorax is unremarkable. IMPRESSION: No active disease. Electronically Signed   By: Lupita Raider M.D.   On: 10/06/2023 16:30   CT HEAD WO CONTRAST ( ) Result Date: 10/06/2023 CLINICAL DATA:  Mental status change, unknown cause EXAM: CT HEAD WITHOUT  CONTRAST TECHNIQUE: Contiguous axial images were obtained from the base of the skull through the vertex without intravenous contrast. RADIATION DOSE REDUCTION: This exam was performed according to the departmental dose-optimization program which includes automated exposure control, adjustment of the mA and/or kV according to patient size and/or use of iterative reconstruction technique. COMPARISON:  09/22/2023 FINDINGS: Brain: No evidence of acute infarction, hemorrhage, hydrocephalus, extra-axial collection or mass lesion/mass effect. Vascular: No hyperdense vessel or unexpected calcification. Skull: Normal. Negative for fracture or focal lesion. Sinuses/Orbits: Improving paranasal sinus disease. Other: None. IMPRESSION: 1. No acute intracranial findings. 2. Improving paranasal sinus disease. Electronically Signed   By: Duanne Guess D.O.   On: 10/06/2023 16:03   MR BRAIN WO CONTRAST Result Date: 09/25/2023 CLINICAL DATA:  Seizure, new onset, no history of trauma. EXAM: MRI HEAD WITHOUT CONTRAST TECHNIQUE: Multiplanar, multiecho pulse sequences of the brain and surrounding structures were obtained without intravenous contrast. COMPARISON:  Head CT September 22, 2023. FINDINGS: The study is partially degraded by motion. Brain: No acute infarction, hemorrhage, hydrocephalus, extra-axial collection or mass lesion. The brain parenchyma has normal morphology and signal characteristics. Vascular: Hypoplastic normal flow voids. Hypoplastic vertebrobasilar system. Skull and upper cervical spine: Normal marrow signal. Sinuses/Orbits: Mucosal thickening throughout the paranasal sinuses with opacification of the right maxillary sinus and anterior right ethmoid cells. The orbits are maintained. Left mastoid effusion. Other: None. IMPRESSION: 1. Unremarkable MRI of the brain. 2. Inflammatory paranasal sinus disease. 3. Left mastoid effusion. Electronically Signed   By: Baldemar Lenis M.D.   On: 09/25/2023  15:59   Overnight EEG with video Result Date: 09/24/2023 Charlsie Quest, MD     09/25/2023  9:35 AM Patient Name: Arnika Larzelere Schroeck MRN: [id number] Epilepsy Attending: Charlsie Quest Referring Physician/Provider: Rejeana Brock, MD Duration: 09/23/2023 1810 to 09/25/2023 1401 Patient history: 58yo F with seizure like activity getting eeg to evaluate for seizure Level of alertness: Awake/ lethargic AEDs during EEG study: LEV, LCM, GBP Technical aspects: This EEG study was done with scalp electrodes positioned according to the 10-20 International system of electrode placement. Electrical activity was reviewed with band pass filter of 1-70Hz , sensitivity of 7 uV/mm, display speed of 56mm/sec with a 60Hz  notched filter applied as appropriate. EEG data were recorded continuously and digitally stored.  Video monitoring was available and reviewed as appropriate. Description: EEG showed continuous generalized 3 to 6 Hz theta-delta slowing. Hyperventilation and photic stimulation were not performed.  EEG was difficult to interpret between 09/23/2023 2105 to 09/24/2023 0024 and on  09/24/2023 between 0316 to 0931 as patient pulled all electrodes. ABNORMALITY - Continuous slow, generalized IMPRESSION: This study is suggestive of moderate diffuse encephalopathy. No seizures or epileptiform discharges were seen throughout the recording. Priyanka Annabelle Harman   CT HEAD WO CONTRAST ( ) Result Date: 09/22/2023 CLINICAL DATA:  Seizure disorder. EXAM: CT HEAD WITHOUT CONTRAST TECHNIQUE: Contiguous  axial images were obtained from the base of the skull through the vertex without intravenous contrast. RADIATION DOSE REDUCTION: This exam was performed according to the departmental dose-optimization program which includes automated exposure control, adjustment of the mA and/or kV according to patient size and/or use of iterative reconstruction technique. COMPARISON:  None Available. FINDINGS: Brain: No mass,hemorrhage or  extra-axial collection. Normal appearance of the parenchyma and CSF spaces. Vascular: No hyperdense vessel or unexpected vascular calcification. Skull: The visualized skull base, calvarium and extracranial soft tissues are normal. Sinuses/Orbits: No fluid levels or advanced mucosal thickening of the visualized paranasal sinuses. No mastoid or middle ear effusion. Normal orbits. Other: None. IMPRESSION: Normal head CT. Electronically Signed   By: Deatra Robinson M.D.   On: 09/22/2023 20:48   EEG adult Result Date: 09/22/2023 Rejeana Brock, MD     09/22/2023  7:32 PM History: 58 year old female being evaluated for possible seizures Sedation: Ativan given earlier Patient State: Awake and asleep Technique: This EEG was acquired with electrodes placed according to the International 10-20 electrode system (including Fp1, Fp2, F3, F4, C3, C4, P3, P4, O1, O2, T3, T4, T5, T6, A1, A2, Fz, Cz, Pz). The following electrodes were missing or displaced: none. Background: The background is relatively well-organized though there is a slow posterior dominant rhythm with a frequency of 6 to 7 Hz.  There is also mild intrusion of irregular slow activity in the delta and theta range.  Selectivity increases with drowsiness and sleep is recorded with symmetric appearing structures.  There is no epileptiform activity seen. Photic stimulation: Physiologic driving is not performed EEG Abnormalities: 1) generalized irregular slow activity 2) slow posterior dominant rhythm Clinical Interpretation: This EEG is consistent with a mild generalized nonspecific cerebral dysfunction (encephalopathy). There was no seizure or seizure predisposition recorded on this study. Please note that lack of epileptiform activity on EEG does not preclude the possibility of epilepsy. Ritta Slot, MD [facility] [phone] If 7pm- 7am, please page neurology on call as listed in AMION.  CT HEAD WO CONTRAST ( ) Result Date:  09/21/2023 CLINICAL DATA:  Mental status change EXAM: CT HEAD WITHOUT CONTRAST TECHNIQUE: Contiguous axial images were obtained from the base of the skull through the vertex without intravenous contrast. RADIATION DOSE REDUCTION: This exam was performed according to the departmental dose-optimization program which includes automated exposure control, adjustment of the mA and/or kV according to patient size and/or use of iterative reconstruction technique. COMPARISON:  CT brain 05/26/2023 FINDINGS: Brain: No evidence of acute infarction, hemorrhage, hydrocephalus, extra-axial collection or mass lesion/mass effect. Vascular: No hyperdense vessels.  No unexpected calcification Skull: Normal. Negative for fracture or focal lesion. Sinuses/Orbits: No acute finding. Osteoma left frontal ethmoidal sinuses. Mucosal thickening in the maxillary and ethmoid sinuses Other: None IMPRESSION: Negative non contrasted CT appearance of the brain. Electronically Signed   By: Jasmine Pang M.D.   On: 09/21/2023 17:41   DG Chest 2 View Result Date: 09/21/2023 CLINICAL DATA:  COPD EXAM: CHEST - 2 VIEW COMPARISON:  09/20/2023 FINDINGS: Left-sided implanted cardiac device in stable positioning. The heart size and mediastinal contours are unchanged. Slightly low lung volumes. No focal airspace consolidation, pleural effusion, or pneumothorax. The visualized skeletal structures are unremarkable. IMPRESSION: No active cardiopulmonary disease. Electronically Signed   By: Duanne Guess D.O.   On: 09/21/2023 11:03   DG Chest 2 View Result Date: 09/20/2023 CLINICAL DATA:  Shortness of breath. EXAM: CHEST - 2 VIEW COMPARISON:  Chest radiograph dated June 06, 2023. FINDINGS:  The cardiomediastinal silhouette is unchanged. Stable left subclavian dual lead AICD in place. No focal consolidation, pleural effusion, or pneumothorax. No acute osseous abnormality. IMPRESSION: No acute cardiopulmonary findings. Electronically Signed   By: Hart Robinsons M.D.   On: 09/20/2023 13:45    Microbiology: Results for orders placed or performed during the hospital encounter of 10/06/23  Culture, blood (Routine X 2) w Reflex to ID Panel     Status: None (Preliminary result)   Collection Time: 10/07/23  8:24 AM   Specimen: BLOOD RIGHT HAND  Result Value Ref Range Status   Specimen Description BLOOD RIGHT HAND  Final   Special Requests   Final    BOTTLES DRAWN AEROBIC AND ANAEROBIC Blood Culture results may not be optimal due to an inadequate volume of blood received in culture bottles   Culture   Final    NO GROWTH < 24 HOURS Performed at [facility], [address], [place], [place] [address]    Report Status PENDING  Incomplete  Culture, blood (Routine X 2) w Reflex to ID Panel     Status: None (Preliminary result)   Collection Time: 10/07/23  8:37 AM   Specimen: BLOOD LEFT HAND  Result Value Ref Range Status   Specimen Description BLOOD LEFT HAND  Final   Special Requests   Final    BOTTLES DRAWN AEROBIC AND ANAEROBIC Blood Culture results may not be optimal due to an inadequate volume of blood received in culture bottles   Culture   Final    NO GROWTH < 24 HOURS Performed at [facility], [address], [place], [place] [address]    Report Status PENDING  Incomplete    Labs: CBC: Recent Labs  Lab 10/06/23 1442 10/06/23 2336 10/07/23 0837 10/08/23 0641  WBC 12.0* 8.5 5.5 4.3  NEUTROABS 8.8*  --  3.3 2.5  HGB 12.2 11.1* 9.9* 9.9*  HCT 36.5 33.5* 29.8* 30.3*  MCV 89.2 88.2 89.0 89.4  PLT 285 229 199 197   Basic Metabolic Panel: Recent Labs  Lab 10/06/23 1442 10/06/23 2336 10/07/23 0837 10/08/23 0641  NA 139 140 144 143  K 2.4* 3.6 3.3* 4.0  CL 92* 97* 99 106  CO2 31 30 27 28   GLUCOSE 176* 165* 136* 140*  BUN 10 10 14 14   CREATININE 0.98 1.16* 1.24* 0.92  CALCIUM 8.3* 7.8* 7.9* 8.5*  MG 2.0  --   --   --    Liver Function Tests: Recent Labs  Lab 10/06/23 1442 10/06/23 2336 10/07/23 0837  10/08/23 0641  AST 57* 43* 35 29  ALT 35 30 27 24   ALKPHOS 83 73 65 66  BILITOT 0.4 0.6 0.5 0.3  PROT 7.3 6.4* 5.8* 5.6*  ALBUMIN 4.0 3.2* 3.3* 3.0*   CBG: Recent Labs  Lab 10/07/23 1244 10/07/23 1401 10/07/23 1611 10/07/23 2120 10/08/23 0636  GLUCAP 123* 111* 137* 152* 139*    Discharge time spent: greater than 30 minutes.  Signed: Briant Cedar, MD [facility] 10/08/2023

## 2023-10-09 NOTE — Procedures (Signed)
Patient Name: Brittany Nguyen  MRN: [id number]  Epilepsy Attending: Charlsie Quest  Referring Physician/Provider: Jefferson Fuel, MD  Duration: 10/07/2023 1250 to 10/07/2023 1414   Patient history: 57yo F with seizure like activity getting eeg to evaluate for seizure   Level of alertness: Awake/ lethargic    AEDs during EEG study: LEV, LCM, GBP   Technical aspects: This EEG study was done with scalp electrodes positioned according to the 10-20 International system of electrode placement. Electrical activity was reviewed with band pass filter of 1-70Hz , sensitivity of 7 uV/mm, display speed of 14mm/sec with a 60Hz  notched filter applied as appropriate. EEG data were recorded continuously and digitally stored.  Video monitoring was available and reviewed as appropriate.   Description: EEG showed continuous generalized 6 to 7 Hz theta slowing. Hyperventilation and photic stimulation were not performed.    At the beginning of the study at around 1250, patient was noted to have head-bobbing, twitching of feet at times, and reported double vision.  Discontinued for over 5 minutes after which patient stopped responding and appeared to be biting her tongue.  Concomitant EEG before, during and after the event did not show EEG change to suggest seizure.   ABNORMALITY - Continuous slow, generalized   IMPRESSION: This study is suggestive of moderate diffuse encephalopathy. No seizures or epileptiform discharges were seen throughout the recording.  One event was recorded as described above without concomitant EEG change.  This was a nonepileptic event.   Brittany Nguyen Annabelle Harman

## 2023-10-11 DIAGNOSIS — I4891 Unspecified atrial fibrillation: Secondary | ICD-10-CM | POA: Diagnosis not present

## 2023-10-11 DIAGNOSIS — E890 Postprocedural hypothyroidism: Secondary | ICD-10-CM | POA: Diagnosis not present

## 2023-10-11 DIAGNOSIS — E876 Hypokalemia: Secondary | ICD-10-CM | POA: Diagnosis not present

## 2023-10-11 DIAGNOSIS — I5022 Chronic systolic (congestive) heart failure: Secondary | ICD-10-CM | POA: Diagnosis not present

## 2023-10-11 DIAGNOSIS — E538 Deficiency of other specified B group vitamins: Secondary | ICD-10-CM | POA: Diagnosis not present

## 2023-10-11 DIAGNOSIS — R569 Unspecified convulsions: Secondary | ICD-10-CM | POA: Diagnosis not present

## 2023-10-12 ENCOUNTER — Ambulatory Visit: Payer: Medicare HMO | Attending: Family Medicine | Admitting: Occupational Therapy

## 2023-10-12 DIAGNOSIS — Z95810 Presence of automatic (implantable) cardiac defibrillator: Secondary | ICD-10-CM | POA: Diagnosis not present

## 2023-10-12 DIAGNOSIS — Z4502 Encounter for adjustment and management of automatic implantable cardiac defibrillator: Secondary | ICD-10-CM | POA: Diagnosis not present

## 2023-10-12 DIAGNOSIS — M6281 Muscle weakness (generalized): Secondary | ICD-10-CM | POA: Diagnosis not present

## 2023-10-12 DIAGNOSIS — R278 Other lack of coordination: Secondary | ICD-10-CM | POA: Insufficient documentation

## 2023-10-12 DIAGNOSIS — R4189 Other symptoms and signs involving cognitive functions and awareness: Secondary | ICD-10-CM | POA: Diagnosis not present

## 2023-10-12 DIAGNOSIS — Z45018 Encounter for adjustment and management of other part of cardiac pacemaker: Secondary | ICD-10-CM | POA: Diagnosis not present

## 2023-10-12 DIAGNOSIS — I5042 Chronic combined systolic (congestive) and diastolic (congestive) heart failure: Secondary | ICD-10-CM | POA: Diagnosis not present

## 2023-10-12 DIAGNOSIS — I5022 Chronic systolic (congestive) heart failure: Secondary | ICD-10-CM | POA: Diagnosis not present

## 2023-10-12 LAB — CULTURE, BLOOD (ROUTINE X 2)
Culture: NO GROWTH
Culture: NO GROWTH

## 2023-10-12 NOTE — Therapy (Signed)
 OUTPATIENT OCCUPATIONAL THERAPY NEURO EVALUATION  Patient Name: Brittany Nguyen MRN: [id number] DOB:[birth year], 58 y.o., female Today's Date: 10/12/2023  PCP:  REFERRING PROVIDER:  Dr. Valora  END OF SESSION:  OT End of Session - 10/12/23 1906     Visit Number 1    Number of Visits 1    Date for OT Re-Evaluation 10/12/23    OT Start Time 1400    OT Stop Time 1445    OT Time Calculation (min) 45 min    Activity Tolerance Patient tolerated treatment well    Behavior During Therapy  Pt. Tolerated the session well            Past Medical History:  Diagnosis Date   Acute anterolateral wall MI (HCC) 08/2016   Acute respiratory failure (HCC)    AICD (automatic cardioverter/defibrillator) present 03/29/2017   a.) Medtronic Evera MRI XT DR SureScan (SN: [id number])   Anxiety    a.) on BZO PRN (lorazepam )   Arthritis    Asthma 2013   Breast cancer, right (HCC)    C. 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   a.) Bx 02/22/2018 --> NE tumor cells --> AE1/AE3, chromogranin, synaptophysin (+); Ki-67 proliferation rate <1%; b.) repeat Bx 08/24/2018 --> well differentiard NET (G1)   CHF (congestive heart failure) (HCC)    Chronic, continuous use of opioids    a.) has naloxone  Rx available   COPD (chronic obstructive pulmonary disease) (HCC)    Coronary artery disease    Depression    Diet-controlled type 2 diabetes mellitus (HCC)    Diverticulitis 2010   DVT (deep venous thrombosis) (HCC)    Endometriosis 1990   Generalized epilepsy (HCC)    a.) on lacosamide ; b.) last seizure 07-30-22 in setting of hypokalemia (K+ 2.6)   GERD (gastroesophageal reflux disease)    GIB (gastrointestinal bleeding) 01/08/2018   H/O syncope    HFrEF (heart failure with reduced ejection fraction) (HCC)    a.)TTE 12/09/16: EF 25-30%, dif HK; b.) TTE 02/08/17: EF 25%, dif HK, mild TR, G1DD; c.) TTE 04/25/17: EF 30-35%, mild LVH; d.) TTE 01/30/18: EF 45%,  MAC, LAE, mild MR, G1DD; e.) TTE 11/06/2018: EF 25%, sev glob HK, triv MR/TR/PR; f.) TTE 05/12/19: EF 35%, MAC, AoV sclerosis, G1DD; g.) TTE 11/05/19: EF 20%, sev glob HK, triv MR; h.) TTE 06/17/20: EF 40%, mod glob HK, triv MR; I:) TTE 06/14/22: EF 25-30%, G1DD   History of cardiac catheterization    a.) LHC 10/21/2014: normal coronaries; b.) R/LHC 02/24/2017: normal coronaries, mRA 12, mPA 28, mPCWP 15, CO 8.0, CI 3.8   History of kidney stones    Hx MRSA infection    Hypertension    Hypokalemia    Hypothyroidism    Iron  deficiency anemia 03/27/2018   Lump or mass in breast 11/27/2012   a.) Bx 11/27/2012 --> apocrine wall cyst   Migraine    Nausea & vomiting    Neuropathy    Non-ischemic cardiomyopathy (HCC)    a.) TTE 12/09/2016: EF 25-30%, b.) TTE 02/08/2017: EF 25%; c.) Medtronic AICD placed 03/29/2017; d.) TTE 04/25/2017: EF 30-35%; e.) TTE 01/30/2018: EF 45%; f.) TTE 11/06/2018: EF 25%; g.) TTE 05/12/2019: EF 35%; h.) TTE 11/05/2019: EF 20%; I.) TTE 06/17/2020: EF 40%; J.) TTE 06/14/2022: EF 25-30%   Obesity    OSA on CPAP    PAF (paroxysmal atrial fibrillation) (HCC)    Pneumonia    CAP   PONV (  postoperative nausea and vomiting)    Post-COVID chronic cough    Postcholecystectomy diarrhea    Prolonged Q-T interval on ECG    RAD (reactive airway disease)    Restless leg    a.) on ropinirole    Seizure (HCC)    Sepsis (HCC)    Unstable angina (HCC)    Past Surgical History:  Procedure Laterality Date   ABDOMINAL HYSTERECTOMY     age 62   BREAST BIOPSY Right 2014   benign   BREAST BIOPSY Right 01/18/2023   Us  Core Bx, coil clip - path pending   BREAST BIOPSY Right 01/18/2023   US  RT BREAST BX W LOC DEV 1ST LESION IMG BX SPEC US  GUIDE 01/18/2023 [facility]   CARDIAC DEFIBRILLATOR PLACEMENT Left 03/29/2017   Procedure: MEDTRONIC CARDIAC DEFIBRILLATOR PLACEMENT (AICD); Location: [facility]; Surgeon: Lane Beard, MD   CESAREAN SECTION     CHOLECYSTECTOMY     COLECTOMY      COLONOSCOPY WITH PROPOFOL  N/A 02/22/2018   Procedure: COLONOSCOPY WITH PROPOFOL ;  Surgeon: Toledo, Ladell POUR, MD;  Location: [facility];  Service: Gastroenterology;  Laterality: N/A;   ESOPHAGOGASTRODUODENOSCOPY (EGD) WITH PROPOFOL  N/A 02/22/2018   Procedure: ESOPHAGOGASTRODUODENOSCOPY (EGD) WITH PROPOFOL ;  Surgeon: Toledo, Ladell POUR, MD;  Location: [facility];  Service: Gastroenterology;  Laterality: N/A;   IVC FILTER INSERTION N/A 08/23/2022   Procedure: IVC FILTER INSERTION;  Surgeon: Jama Cordella MATSU, MD;  Location: [facility];  Service: Cardiovascular;  Laterality: N/A;   IVC FILTER REMOVAL N/A 03/07/2023   Procedure: IVC FILTER REMOVAL;  Surgeon: Jama Cordella MATSU, MD;  Location: [facility];  Service: Cardiovascular;  Laterality: N/A;   KNEE ARTHROPLASTY Left 08/24/2022   Procedure: COMPUTER ASSISTED TOTAL KNEE ARTHROPLASTY;  Surgeon: Mardee Lynwood SQUIBB, MD;  Location: [facility];  Service: Orthopedics;  Laterality: Left;   LEFT HEART CATH AND CORONARY ANGIOGRAPHY Left 10/21/2014   Procedure: LEFT HEART CATH AND CORONARY ANGIOGRAPHY; Location: [facility]; Surgeon: Wolm Rhyme, MD   NASAL SINUS SURGERY  2012   OOPHORECTOMY     RIGHT/LEFT HEART CATH AND CORONARY ANGIOGRAPHY Bilateral 02/24/2017   Procedure: RIGHT/LEFT HEART CATH AND CORONARY ANGIOGRAPHY; Location: [facility]   THYROIDECTOMY     Patient Active Problem List   Diagnosis Date Noted   Seizure-like activity (HCC) 10/06/2023   Recurrent seizures (HCC) 09/23/2023   COPD exacerbation (HCC) 09/21/2023   Encephalopathy 11/30/2022   HTN (hypertension) 09/19/2022   CAD (coronary artery disease) 09/19/2022   Depression with anxiety 09/19/2022   Nonproductive cough 09/11/2022   Hypothyroidism 09/11/2022   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   CHF (congestive heart failure) (HCC) 08/17/2022   History of DVT (deep vein thrombosis) 08/17/2022   MRSA (methicillin resistant Staphylococcus aureus) 08/17/2022    Hypocalcemia 07/31/2022   Diarrhea 07/31/2022   Essential hypertension 07/31/2022   Morbid obesity with body mass index (BMI) of 50.0 to 59.9 in adult (HCC) 07/31/2022   Seizures (HCC) 07/30/2022   Breast cancer (HCC) 05/23/2022   Bloody stool 04/22/2022   Seizure (HCC) 03/01/2022   Refractory nausea and vomiting 12/15/2021   Migraine 11/09/2021   Neuropathy 11/09/2021   Anxiety 11/09/2021   At risk for prolonged QT interval syndrome - borderline long QT on EKG 11/07/21 in ED 11/07/2021   QT prolongation 09/27/2021   Cancer related pain 08/10/2021   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   Seizure disorder (HCC) 06/22/2021   Chronic diarrhea 06/22/2021   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   Moderate protein-calorie malnutrition (HCC)  06/19/2020   Non-ischemic cardiomyopathy (HCC) 01/23/2020   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/23/2020   Breakthrough seizure (HCC) 01/23/2020   Acquired hypothyroidism 01/23/2020   Chronic use of opiate for therapeutic purpose 01/23/2020   Type 2 diabetes mellitus without complication (HCC) 01/23/2020   Current moderate episode of major depressive disorder (HCC) 05/21/2019   Viral syndrome 04/24/2019   DVT (deep venous thrombosis) (HCC) 02/27/2019   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   Coronary artery disease involving native coronary artery of native heart 08/24/2018   Mild intermittent asthma without complication 08/24/2018   Breast mass 08/08/2018   Intractable migraine with aura without status migrainosus 06/20/2018   Osteoarthritis of knee 06/01/2018   Pain in right knee 05/15/2018   Abdominal wall pain in right flank 04/10/2018   Postcholecystectomy diarrhea 04/10/2018   Iron  deficiency anemia 03/27/2018   Normocytic anemia 03/26/2018   Unstable angina (HCC)    Encounter for anticoagulation discussion and counseling     Influenza A 10/29/2017   COPD (chronic obstructive pulmonary disease) (HCC) 04/26/2017   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 02/03/2017   Hypotension 02/03/2017   RLS (restless legs syndrome) 12/09/2016   Hypokalemia 11/22/2015   OSA on CPAP 04/17/2014   Hx MRSA infection    Lump or mass in breast    Asthma 2013   GOITER, MULTINODULAR 01/20/2009   HYPOGLYCEMIA, UNSPECIFIED 01/20/2009   GERD 01/20/2009   DIVERTICULITIS OF COLON 01/20/2009   Diverticulitis of colon 01/20/2009    ONSET DATE: 09/2023  REFERRING DIAG: Seizure-like activity  THERAPY DIAG:  Muscle weakness (generalized)  Other lack of coordination  Impaired cognition  Rationale for Evaluation and Treatment: Rehabilitation  SUBJECTIVE:   SUBJECTIVE STATEMENT:  Pt. reports that she is not driving.  Pt accompanied by: self  PERTINENT HISTORY:  Pt. Is a 58 y.o. female who  has has had multiple recent hospitalizations from 10/06/2023-10/08/23 with seizure-like activity, 09/23/2023-09/27/2023 with SOB. Pt. Has had a history of seizures beginning approximately 1 -1.5 years ago. Pt. reports having memory changes after this most recent seizure. Pt. Reports noticing a significant change with not being able to think of, or vocalize responses quickly enough when talking to family, and friends. See above for detailed list of PMHx.  PRECAUTIONS: Seizure- No driving  WEIGHT BEARING RESTRICTIONS: No  PAIN:  Are you having pain? Stomach tumors 6-7/10  FALLS: Has patient fallen in last 6 months? No  LIVING ENVIRONMENT: Lives with: lives with their spouse Lives in: House/apartment Stair: 10-12 front, 5-6 steps in the back Has following equipment at home: Single point cane  PLOF: Independent  PATIENT GOALS:  To improve memory  OBJECTIVE:  Note: Objective measures were completed at evaluation unless otherwise noted.  HAND DOMINANCE: Right  ADLs: Overall ADLs:   Independent Transfers/ambulation related to ADLs: Eating: Independent Grooming: Independent UB Dressing:  Independent  LB Dressing: Independent Toileting: Independent Bathing: Independent  Tub Shower transfers: Distant Supervision inand out of the Garden tube   IADLs: Shopping: Needs to be accompanied Light housekeeping: Independent Meal Prep: Independent with meal preparation Community mobility: Relies an family and friends Medication management: Independent Financial management: No change from baseline Handwriting: Independent  MOBILITY STATUS: Independent; Pt. Reports having a history of a TKR, and was scheduled to have the opposite knee done, however was unable to have it done 2/2 recent hospitalization.  ACTIVITY TOLERANCE: Activity tolerance:  Fatigues after 20 min. of activity  FUNCTIONAL OUTCOME MEASURES:   UPPER EXTREMITY ROM:    Active ROM Right Eval WFL Left Eval [facility]  Shoulder flexion    Shoulder abduction    Shoulder adduction    Shoulder extension    Shoulder internal rotation    Shoulder external rotation    Elbow flexion    Elbow extension    Wrist flexion    Wrist extension    Wrist ulnar deviation    Wrist radial deviation    Wrist pronation    Wrist supination    (Blank rows = not tested)  UPPER EXTREMITY MMT:     MMT Right Eval WFL Left Eval [facility]  Shoulder flexion    Shoulder abduction    Shoulder adduction    Shoulder extension    Shoulder internal rotation    Shoulder external rotation    Middle trapezius    Lower trapezius    Elbow flexion    Elbow extension    Wrist flexion    Wrist extension    Wrist ulnar deviation    Wrist radial deviation    Wrist pronation    Wrist supination    (Blank rows = not tested)  HAND FUNCTION: Grip strength: Right: 32 lbs; Left: 30 lbs, Lateral pinch: Right: 13 lbs, Left: 11 lbs, and 3 point pinch: Right: 6 lbs, Left: 9 lbs  COORDINATION: 9 Hole Peg test: Right: 24 sec; Left: 23  sec  SENSATION: WFL Light touch: WFL Proprioception: WFL  EDEMA: N/A  MUSCLE TONE: WFL  COGNITION: Overall cognitive status: Memory, processing answers to questions require increased time for processing   VISION: Subjective report:  No changes in vision, wears glasses at all times  VISION ASSESSMENT: WFL Glasses for reading                                                                                                             PATIENT EDUCATION: Education details: OT services, POC, goals, compensatory strategies for handling cellphone, and items 2/2 jerky movements at times. Person educated: Patient Education method: Medical Illustrator Education comprehension: verbalized understanding  HOME EXERCISE PROGRAM: N/A   GOALS:   1 visit only. No treatment POC or goals are warranted at this time.  ASSESSMENT:  CLINICAL IMPRESSION:  Patient is a 58 y.o. female who was seen today for occupational therapy evaluation for seizure-like activity. Pt. is independent with daily ADL, and IADL care tasks. Pt. bilateral UE strength, grip strength, pinch strength, and FMC skills are all WFL. Further OT skilled services are not warranted at this time. Pt. Does report occasional jerky hand movements when handling her cell phone. Pt. education was provided about compensatory strategies for proximal UE stabilization, and support, stabilization, and support attachments on the phone such as pop sockets. Pt. Reports that her biggest, and most pressing concern is the memory changes that were affected by the recent seizure activity. Pt. Reports having difficulty remembering, recalling, and vocalizing things quickly enough during conversations. A referral request has been  placed to Dr. Valora for a speech therapy consult.   PERFORMANCE DEFICITS: N/A  IMPAIRMENTS: N/A  CO-MORBIDITIES: may have co-morbidities  that affects occupational performance. Patient will benefit from skilled  OT to address above impairments and improve overall function.  MODIFICATION OR ASSISTANCE TO COMPLETE EVALUATION: Min-Moderate modification of tasks or assist with assess necessary to complete an evaluation.  OT OCCUPATIONAL PROFILE AND HISTORY: Detailed assessment: Review of records and additional review of physical, cognitive, psychosocial history related to current functional performance.  CLINICAL DECISION MAKING: Moderate - several treatment options, min-mod task modification necessary  REHAB POTENTIAL: Good  EVALUATION COMPLEXITY: Moderate    PLAN: One initial visit only  RECOMMENDED OTHER SERVICES: ST consult   CONSULTED AND AGREED WITH PLAN OF CARE: Patient  PLAN FOR NEXT SESSION:   Richardson Otter, MS, OTR/L   10/12/2023, 7:16 PM

## 2023-10-16 DIAGNOSIS — C7A8 Other malignant neuroendocrine tumors: Secondary | ICD-10-CM | POA: Diagnosis not present

## 2023-10-16 DIAGNOSIS — E890 Postprocedural hypothyroidism: Secondary | ICD-10-CM | POA: Diagnosis not present

## 2023-10-16 DIAGNOSIS — E559 Vitamin D deficiency, unspecified: Secondary | ICD-10-CM | POA: Diagnosis not present

## 2023-10-16 DIAGNOSIS — C7A092 Malignant carcinoid tumor of the stomach: Secondary | ICD-10-CM | POA: Diagnosis not present

## 2023-10-16 DIAGNOSIS — E538 Deficiency of other specified B group vitamins: Secondary | ICD-10-CM | POA: Diagnosis not present

## 2023-10-16 DIAGNOSIS — R569 Unspecified convulsions: Secondary | ICD-10-CM | POA: Diagnosis not present

## 2023-10-16 DIAGNOSIS — G893 Neoplasm related pain (acute) (chronic): Secondary | ICD-10-CM | POA: Diagnosis not present

## 2023-10-16 DIAGNOSIS — D3A8 Other benign neuroendocrine tumors: Secondary | ICD-10-CM | POA: Diagnosis not present

## 2023-10-16 DIAGNOSIS — K580 Irritable bowel syndrome with diarrhea: Secondary | ICD-10-CM | POA: Diagnosis not present

## 2023-10-16 DIAGNOSIS — G2581 Restless legs syndrome: Secondary | ICD-10-CM | POA: Diagnosis not present

## 2023-10-17 ENCOUNTER — Ambulatory Visit: Payer: Medicare HMO | Admitting: Occupational Therapy

## 2023-10-23 ENCOUNTER — Encounter: Payer: Medicare HMO | Admitting: Occupational Therapy

## 2023-10-24 DIAGNOSIS — C7A8 Other malignant neuroendocrine tumors: Secondary | ICD-10-CM | POA: Diagnosis not present

## 2023-10-24 DIAGNOSIS — C7B8 Other secondary neuroendocrine tumors: Secondary | ICD-10-CM | POA: Diagnosis not present

## 2023-10-24 DIAGNOSIS — G893 Neoplasm related pain (acute) (chronic): Secondary | ICD-10-CM | POA: Diagnosis not present

## 2023-10-24 DIAGNOSIS — C7A092 Malignant carcinoid tumor of the stomach: Secondary | ICD-10-CM | POA: Diagnosis not present

## 2023-10-24 DIAGNOSIS — D3A8 Other benign neuroendocrine tumors: Secondary | ICD-10-CM | POA: Diagnosis not present

## 2023-10-26 ENCOUNTER — Encounter: Payer: Medicare HMO | Admitting: Occupational Therapy

## 2023-10-30 ENCOUNTER — Encounter: Payer: Medicare HMO | Admitting: Occupational Therapy

## 2023-11-02 ENCOUNTER — Encounter: Payer: Medicare HMO | Admitting: Occupational Therapy

## 2023-11-07 ENCOUNTER — Encounter: Payer: Medicare HMO | Admitting: Occupational Therapy

## 2023-11-09 ENCOUNTER — Encounter: Payer: Medicare HMO | Admitting: Occupational Therapy

## 2023-11-13 DIAGNOSIS — Z6836 Body mass index (BMI) 36.0-36.9, adult: Secondary | ICD-10-CM | POA: Diagnosis not present

## 2023-11-13 DIAGNOSIS — I5042 Chronic combined systolic (congestive) and diastolic (congestive) heart failure: Secondary | ICD-10-CM | POA: Diagnosis not present

## 2023-11-13 DIAGNOSIS — R569 Unspecified convulsions: Secondary | ICD-10-CM | POA: Diagnosis not present

## 2023-11-13 DIAGNOSIS — E669 Obesity, unspecified: Secondary | ICD-10-CM | POA: Diagnosis not present

## 2023-11-13 DIAGNOSIS — Z4502 Encounter for adjustment and management of automatic implantable cardiac defibrillator: Secondary | ICD-10-CM | POA: Diagnosis not present

## 2023-11-14 ENCOUNTER — Encounter: Payer: Medicare HMO | Admitting: Occupational Therapy

## 2023-11-15 ENCOUNTER — Other Ambulatory Visit: Payer: Self-pay

## 2023-11-15 ENCOUNTER — Emergency Department

## 2023-11-15 ENCOUNTER — Emergency Department
Admission: EM | Admit: 2023-11-15 | Discharge: 2023-11-15 | Disposition: A | Discharge status: Home / Self Care | Attending: Emergency Medicine | Admitting: Emergency Medicine

## 2023-11-15 DIAGNOSIS — Z95810 Presence of automatic (implantable) cardiac defibrillator: Secondary | ICD-10-CM | POA: Diagnosis not present

## 2023-11-15 DIAGNOSIS — R0789 Other chest pain: Secondary | ICD-10-CM | POA: Diagnosis not present

## 2023-11-15 DIAGNOSIS — R0781 Pleurodynia: Secondary | ICD-10-CM | POA: Diagnosis not present

## 2023-11-15 DIAGNOSIS — R0989 Other specified symptoms and signs involving the circulatory and respiratory systems: Secondary | ICD-10-CM | POA: Diagnosis not present

## 2023-11-15 LAB — CBC
HCT: 35.4 % — ABNORMAL LOW (ref 36.0–46.0)
Hemoglobin: 11.6 g/dL — ABNORMAL LOW (ref 12.0–15.0)
MCH: 30 pg (ref 26.0–34.0)
MCHC: 32.8 g/dL (ref 30.0–36.0)
MCV: 91.5 fL (ref 80.0–100.0)
Platelets: 193 10*3/uL (ref 150–400)
RBC: 3.87 MIL/uL (ref 3.87–5.11)
RDW: 16 % — ABNORMAL HIGH (ref 11.5–15.5)
WBC: 7.8 10*3/uL (ref 4.0–10.5)
nRBC: 0 % (ref 0.0–0.2)

## 2023-11-15 LAB — TROPONIN I (HIGH SENSITIVITY): Troponin I (High Sensitivity): <2 ng/L (ref <18)

## 2023-11-15 LAB — BASIC METABOLIC PANEL
Anion gap: 9 (ref 5–15)
BUN: 9 mg/dL (ref 6–20)
CO2: 30 mmol/L (ref 22–32)
Calcium: 8.1 mg/dL — ABNORMAL LOW (ref 8.9–10.3)
Chloride: 100 mmol/L (ref 98–111)
Creatinine, Ser: 0.93 mg/dL (ref 0.44–1.00)
GFR, Estimated: >60 mL/min (ref >60)
Glucose, Bld: 157 mg/dL — ABNORMAL HIGH (ref 70–99)
Potassium: 2.8 mmol/L — ABNORMAL LOW (ref 3.5–5.1)
Sodium: 139 mmol/L (ref 135–145)

## 2023-11-15 MED ORDER — OXYCODONE-ACETAMINOPHEN 7.5-325 MG PO TABS: 1.0000 | ORAL_TABLET | ORAL | 0 refills | Status: DC | PRN

## 2023-11-15 MED ORDER — POTASSIUM CHLORIDE 20 MEQ PO PACK
40.0000 meq | PACK | Freq: Once | ORAL | Status: AC
  Administered 2023-11-15: 40 meq via ORAL
  Filled 2023-11-15: qty 2

## 2023-11-15 MED ORDER — OXYCODONE-ACETAMINOPHEN 7.5-325 MG PO TABS
1.0000 | ORAL_TABLET | Freq: Once | ORAL | Status: AC
  Administered 2023-11-15: 1 via ORAL
  Filled 2023-11-15: qty 1

## 2023-11-15 NOTE — ED Provider Notes (Signed)
 [facility] Provider Note   Event Date/Time   First MD Initiated Contact with Patient 11/15/23 [phone]     (approximate) History  Chest Injury  HPI Brittany Nguyen is a 58 y.o. female currently undergoing chemotherapy for malignant carcinoid tumor of the stomach who presents 1 day after a chemotherapeutic infusion during which she had an episode of loss of consciousness that was reportedly seizure-like activity.  However during this activity patient reportedly stopped breathing and received chest compressions.  Patient presents today complaining of significant central chest pain that is worse with breathing.  Patient states that she has methadone at home twice a day that has not been controlling her pain.  Patient also endorses shortness of breath secondary to not being able to take a deep breath secondary to pain ROS: Patient currently denies any vision changes, tinnitus, difficulty speaking, facial droop, sore throat, abdominal pain, nausea/vomiting/diarrhea, dysuria, or weakness/numbness/paresthesias in any extremity   Physical Exam  Triage Vital Signs: ED Triage Vitals  Encounter Vitals Group     BP 11/15/23 0615 103/78     Systolic BP Percentile --      Diastolic BP Percentile --      Pulse Rate 11/15/23 0615 93     Resp 11/15/23 0615 20     Temp 11/15/23 0615 98.5 F (36.9 C)     Temp Source 11/15/23 0615 Oral     SpO2 11/15/23 0615 99 %     Weight 11/15/23 0614 200 lb (90.7 kg)     Height 11/15/23 0614 5\' 5"  (1.651 m)     Head Circumference --      Peak Flow --      Pain Score 11/15/23 0614 9     Pain Loc --      Pain Education --      Exclude from Growth Chart --    Most recent vital signs: Vitals:   11/15/23 0615  BP: 103/78  Pulse: 93  Resp: 20  Temp: 98.5 F (36.9 C)  SpO2: 99%   General: Awake, oriented x4. CV:  Good peripheral perfusion.  Resp:  Normal effort.  Tachypneic Abd:  No distention.  Other:  Middle-aged obese Caucasian  female resting on stretcher in moderate distress secondary to pain.  Tenderness to palpation over central chest ED Results / Procedures / Treatments  Labs (all labs ordered are listed, but only abnormal results are displayed) Labs Reviewed  BASIC METABOLIC PANEL - Abnormal; Notable for the following components:      Result Value   Potassium 2.8 (*)    Glucose, Bld 157 (*)    Calcium 8.1 (*)    All other components within normal limits  CBC - Abnormal; Notable for the following components:   Hemoglobin 11.6 (*)    HCT 35.4 (*)    RDW 16.0 (*)    All other components within normal limits  TROPONIN I (HIGH SENSITIVITY)  TROPONIN I (HIGH SENSITIVITY)   EKG ED ECG REPORT I, Merwyn Katos, the attending physician, personally viewed and interpreted this ECG. Date: 11/15/2023 EKG Time: 0617 Rate: 91 Rhythm: normal sinus rhythm QRS Axis: normal Intervals: First-degree AV block ST/T Wave abnormalities: normal Narrative Interpretation: Normal sinus rhythm with first-degree AV block.  No evidence of acute ischemia RADIOLOGY ED MD interpretation: 2 view chest x-ray interpreted by me shows no evidence of acute abnormalities including no pneumonia, pneumothorax, or widened mediastinum.  There is incidentally seen low lung volumes -Agree with radiology  assessment Official radiology report(s): DG Chest 2 View Result Date: 11/15/2023 CLINICAL DATA:  58 year old female with history of chest pain. Patient reports a history of CPR yesterday. Right-sided rib pain. Shortness of breath. EXAM: CHEST - 2 VIEW COMPARISON:  Multiple priors, most recently 10/06/2023. FINDINGS: Lung volumes are low. No consolidative airspace disease. No pleural effusions. No pneumothorax. No pulmonary nodule or mass noted. Pulmonary vasculature and the cardiomediastinal silhouette are within normal limits. Left-sided pacemaker/AICD with lead tips projecting over the expected location of the right atrium and right ventricle.  IMPRESSION: 1. Low lung volumes without radiographic evidence of acute cardiopulmonary disease. Electronically Signed   By: Trudie Reed M.D.   On: 11/15/2023 06:52   PROCEDURES: Critical Care performed: No Procedures MEDICATIONS ORDERED IN ED: Medications  oxyCODONE-acetaminophen (PERCOCET) 7.5-325 MG per tablet 1 tablet (1 tablet Oral Given 11/15/23 0806)  potassium chloride (KLOR-CON) packet 40 mEq (40 mEq Oral Given 11/15/23 0806)   IMPRESSION / MDM / ASSESSMENT AND PLAN / ED COURSE  I reviewed the triage vital signs and the nursing notes.                             The patient is on the cardiac monitor to evaluate for evidence of arrhythmia and/or significant heart rate changes. Patient's presentation is most consistent with acute presentation with potential threat to life or bodily function. This patient presents with atypical chest pain, most likely secondary to musculoskeletal injury. Differential diagnosis includes rib fracture, costochondritis, sternal fracture. Low suspicion for ACS, acute PE (PERC negative), pericarditis / myocarditis, thoracic aortic dissection, pneumothorax, pneumonia or other acute infectious process. Presentation not consistent with other acute, emergent causes of chest pain at this time. No indication for cardiac enzyme testing. Plan to order CXR to evaluate for acute cardiopulmonary causes.  Plan: EKG, CXR, pain control  Dispo: Discharge home with home care   FINAL CLINICAL IMPRESSION(S) / ED DIAGNOSES   Final diagnoses:  Chest wall pain  Pleuritic chest pain   Rx / DC Orders   ED Discharge Orders          Ordered    oxyCODONE-acetaminophen (PERCOCET) 7.5-325 MG tablet  Every 4 hours PRN        11/15/23 0748           Note:  This document was prepared using Dragon voice recognition software and may include unintentional dictation errors.   Merwyn Katos, MD 11/15/23 [phone]

## 2023-11-15 NOTE — ED Triage Notes (Signed)
 Pt reports she was at [facility] yesterday getting her cancer treatment and they did CPR on her because she stopped breathing while receiving the treatment. Pt reports she is hurting on the right side of her ribs and having some shortness of breath.

## 2023-11-15 NOTE — ED Notes (Signed)
 See triage note  Presents with some discomfort in chest and rib area  States she became unresponsive yesterday during her cancer treatment  They did some chest compressions

## 2023-11-16 ENCOUNTER — Encounter: Payer: Medicare HMO | Admitting: Occupational Therapy

## 2023-11-19 ENCOUNTER — Other Ambulatory Visit: Payer: Self-pay

## 2023-11-19 ENCOUNTER — Emergency Department
Admission: EM | Admit: 2023-11-19 | Discharge: 2023-11-19 | Disposition: A | Discharge status: Home / Self Care | Attending: Emergency Medicine | Admitting: Emergency Medicine

## 2023-11-19 ENCOUNTER — Emergency Department

## 2023-11-19 DIAGNOSIS — W010XXA Fall on same level from slipping, tripping and stumbling without subsequent striking against object, initial encounter: Secondary | ICD-10-CM | POA: Insufficient documentation

## 2023-11-19 DIAGNOSIS — S4991XA Unspecified injury of right shoulder and upper arm, initial encounter: Secondary | ICD-10-CM | POA: Diagnosis not present

## 2023-11-19 DIAGNOSIS — S29001A Unspecified injury of muscle and tendon of front wall of thorax, initial encounter: Secondary | ICD-10-CM | POA: Insufficient documentation

## 2023-11-19 DIAGNOSIS — S299XXA Unspecified injury of thorax, initial encounter: Secondary | ICD-10-CM | POA: Diagnosis not present

## 2023-11-19 DIAGNOSIS — I517 Cardiomegaly: Secondary | ICD-10-CM | POA: Diagnosis not present

## 2023-11-19 DIAGNOSIS — S79911A Unspecified injury of right hip, initial encounter: Secondary | ICD-10-CM

## 2023-11-19 DIAGNOSIS — M79601 Pain in right arm: Secondary | ICD-10-CM | POA: Diagnosis not present

## 2023-11-19 DIAGNOSIS — W19XXXA Unspecified fall, initial encounter: Secondary | ICD-10-CM

## 2023-11-19 DIAGNOSIS — R079 Chest pain, unspecified: Secondary | ICD-10-CM | POA: Diagnosis not present

## 2023-11-19 DIAGNOSIS — M25551 Pain in right hip: Secondary | ICD-10-CM | POA: Diagnosis not present

## 2023-11-19 MED ORDER — KETOROLAC TROMETHAMINE 30 MG/ML IJ SOLN
30.0000 mg | Freq: Once | INTRAMUSCULAR | Status: AC
  Administered 2023-11-19: 30 mg via INTRAMUSCULAR
  Filled 2023-11-19: qty 1

## 2023-11-19 MED ORDER — ACETAMINOPHEN 500 MG PO TABS
1000.0000 mg | ORAL_TABLET | Freq: Once | ORAL | Status: AC
  Administered 2023-11-19: 1000 mg via ORAL
  Filled 2023-11-19: qty 2

## 2023-11-19 MED ORDER — MUSCLE RUB 10-15 % EX CREA: 1.0000 | TOPICAL_CREAM | CUTANEOUS | 0 refills | Status: DC | PRN

## 2023-11-19 NOTE — Discharge Instructions (Addendum)
 Take acetaminophen 650 mg and ibuprofen 400 mg every 6 hours for pain.  Take with food. Use muscle rub for pain.  Use incentive spirometer throughout the day for proper aeration of the lungs and to prevent infection  Thank you for choosing [place] for your health care today!  Please see your primary doctor this week for a follow up appointment.   If you have any new, worsening, or unexpected symptoms call your doctor right away or come back to the emergency department for reevaluation.  It was my pleasure to care for you today.   Daneil Dan Modesto Charon, MD

## 2023-11-19 NOTE — ED Provider Notes (Signed)
 [facility] Provider Note    Event Date/Time   First MD Initiated Contact with Patient 11/19/23 0209     (approximate)   History   Fall   HPI  Brittany Nguyen is a 58 y.o. female   Past medical history of multiple medical comorbidities who presents with a mechanical slip and fall over a pillow landing onto her right side.  She denies head strike loss of consciousness does not use blood thinners.  She injured her right shoulder and right hip.  She is able to get up and walk at the time pain was okay.  Then became more sore to the shoulder prompting emergency department visit.  No other acute medical complaints.  Hurts to take a deep breath due to her right shoulder right chest wall pain on the lateral side.      Physical Exam   Triage Vital Signs: ED Triage Vitals  Encounter Vitals Group     BP 11/19/23 0146 96/80     Systolic BP Percentile --      Diastolic BP Percentile --      Pulse Rate 11/19/23 0146 100     Resp 11/19/23 0146 20     Temp 11/19/23 0146 98.3 F (36.8 C)     Temp Source 11/19/23 0146 Oral     SpO2 11/19/23 0146 98 %     Weight 11/19/23 0145 200 lb (90.7 kg)     Height 11/19/23 0145 5\' 5"  (1.651 m)     Head Circumference --      Peak Flow --      Pain Score 11/19/23 0144 9     Pain Loc --      Pain Education --      Exclude from Growth Chart --     Most recent vital signs: Vitals:   11/19/23 0146  BP: 96/80  Pulse: 100  Resp: 20  Temp: 98.3 F (36.8 C)  SpO2: 98%    General: Awake, no distress.  CV:  Good peripheral perfusion.  Resp:  Normal effort.  Abd:  No distention.  Other:  Comfortable appearing in no acute distress pleasant woman vital signs normal.  Lungs clear.  Right sided breast/chest wall tenderness palpation without any sign of overlying injury.  Able to range the shoulder but at 90 degrees develops pain.  Neurovascular intact.  No pain to palpation of the elbow or wrist or hand and is able to  range at those joints.  Right hip full active range of motion neurovascular intact.  The remainder of the extremities, abdomen, head neck exam unremarkable.   ED Results / Procedures / Treatments   Labs (all labs ordered are listed, but only abnormal results are displayed) Labs Reviewed - No data to display   RADIOLOGY I independently reviewed and interpreted x-ray of the humerus and see no obvious fracture or dislocation I also reviewed radiologist's formal read.   PROCEDURES:  Critical Care performed: No  Procedures   MEDICATIONS ORDERED IN ED: Medications  ketorolac (TORADOL) 30 MG/ML injection 30 mg (30 mg Intramuscular Given 11/19/23 0254)  acetaminophen (TYLENOL) tablet 1,000 mg (1,000 mg Oral Given 11/19/23 0253)   IMPRESSION / MDM / ASSESSMENT AND PLAN / ED COURSE  I reviewed the triage vital signs and the nursing notes.  Patient's presentation is most consistent with acute presentation with potential threat to life or bodily function.  Differential diagnosis includes, but is not limited to, fractures or dislocations, chest wall injury including rib fractures, contusion, pneumothorax    MDM: Mechanical slip and fall leading to shoulder, chest wall, hip injury.  X-rays negative for acute fractures dislocations or pneumothorax.  Patient appears well and comfortable.  No other injuries noted on full head to toe examination.  Anticipatory guidance given, incentive spirometry, follow-up with PMD.         FINAL CLINICAL IMPRESSION(S) / ED DIAGNOSES   Final diagnoses:  Fall, initial encounter  Chest wall injury, initial encounter  Right shoulder injury, initial encounter  Hip injury, right, initial encounter     Rx / DC Orders   ED Discharge Orders          Ordered    Menthol-Methyl Salicylate (MUSCLE RUB) 10-15 % CREA  As needed        11/19/23 0258             Note:  This document was prepared using Dragon voice  recognition software and may include unintentional dictation errors.    Pilar Jarvis, MD 11/19/23 [phone]

## 2023-11-19 NOTE — ED Triage Notes (Signed)
 Pt arrived POV after tripping over a pillow PTA and fell on the floor landing on her right side, denies hitting head or LOC. Pt c/o right arm pain, pt also reports on-going ribs pain from CPR on 3/12 that has not gotten better along with increase SOB since having CPR. Pt also having right hip pain, able to bear weight to her right leg. Pt st in general having pain all over. Pt is not on blood thinners. A&O x4, NAD noted.

## 2023-11-21 ENCOUNTER — Encounter: Payer: Medicare HMO | Admitting: Occupational Therapy

## 2023-11-21 NOTE — Discharge Instructions (Signed)
 Instructions after Total Knee Replacement   Brittany Nguyen P. Angie Fava., M.D.    Dept. of Orthopaedics & Sports Medicine [facility] [address] [place], [place]  [address]  Phone: [phone]   Fax: [phone]       [email or web address]       DIET: Drink plenty of non-alcoholic fluids. Resume your normal diet. Include foods high in fiber.  ACTIVITY:  You may use crutches or a walker with weight-bearing as tolerated, unless instructed otherwise. You may be weaned off of the walker or crutches by your Physical Therapist.  Do NOT place pillows under the knee. Anything placed under the knee could limit your ability to straighten the knee.   Use the Bone Foam 3 times a day for 30 minutes each session to help straighten the knee. Continue doing gentle exercises. Exercising will reduce the pain and swelling, increase motion, and prevent muscle weakness.   Please continue to use the TED compression stockings for 6 weeks. You may remove the stockings at night, but should reapply them in the morning. Do not drive or operate any equipment until instructed.  WOUND CARE:  The initial dressing (Aquacel) can remain in place for 7 days (see separate instructions). Continue to use the PolarCare or ice packs periodically to reduce pain and swelling. You may bathe or shower after the staples are removed at the first office visit following surgery.  MEDICATIONS: You may resume your regular medications. Please take the pain medication as prescribed on the medication. Do not take pain medication on an empty stomach. Unless instructed otherwise, you should take an enteric-coated aspirin 81 mg. TWICE a day. (This along with elevation will help reduce the possibility of blood clots/phlebitis in your operated leg.) Use a stool softener (such as Senokot-S or Colace) daily and a laxative (such as Miralax or Dulcolax) as needed to prevent constipation.  Do not drive or drink alcoholic beverages when  taking pain medications.  CALL THE OFFICE FOR: Temperature above 101 degrees Excessive bleeding or drainage on the dressing. Excessive swelling, coldness, or paleness of the toes. Persistent nausea and vomiting.  FOLLOW-UP:  You should have an appointment to return to the office in 10-14 days after surgery. Arrangements have been made for continuation of Physical Therapy (either home therapy or outpatient therapy).     [facility] Department Directory         [email or web address]       [email or web address]          Cardiology  Appointments: [place] - [phone]  Endocrinology  Appointments: [place] [phone] [place] - [phone]  Gastroenterology  Appointments: [employer] [phone] [place] - [phone]        General Surgery   Appointments: [facility]  Internal Medicine/Family Medicine  Appointments: [facility] [place] - [phone] [place] - [phone]  Metabolic and Weigh Loss Surgery  Appointments: [facility]        Neurology  Appointments: [place] [phone] [place] - [phone]  Neurosurgery  Appointments: [place]  Obstetrics & Gynecology  Appointments: [place] [phone] [place] - [phone]        Pediatrics  Appointments: [place] [phone] [place] - [phone]  Physiatry  Appointments: [place] [phone]  Physical Therapy  Appointments: [place] - [phone]        Podiatry  Appointments: [place] [phone] [place] - [phone]  Pulmonology  Appointments: [facility]  Rheumatology  Appointments: [place] [phone]  [place] Location: [facility]  [address] [place], [place]  [address]  [place] Location: [facility]. [address] [place], [place]  [address]  [place]  Location: [facility] [address] [place], [place]  [address]

## 2023-11-22 ENCOUNTER — Encounter
Admission: RE | Admit: 2023-11-22 | Discharge: 2023-11-22 | Disposition: A | Discharge status: Home / Self Care | Source: Ambulatory Visit | Attending: Orthopedic Surgery | Admitting: Orthopedic Surgery

## 2023-11-22 ENCOUNTER — Other Ambulatory Visit: Payer: Self-pay

## 2023-11-22 VITALS — BP 127/91 | HR 86 | Resp 16 | Wt 216.5 lb

## 2023-11-22 DIAGNOSIS — Z01818 Encounter for other preprocedural examination: Secondary | ICD-10-CM | POA: Insufficient documentation

## 2023-11-22 DIAGNOSIS — M1711 Unilateral primary osteoarthritis, right knee: Secondary | ICD-10-CM

## 2023-11-22 DIAGNOSIS — R8290 Unspecified abnormal findings in urine: Secondary | ICD-10-CM | POA: Diagnosis not present

## 2023-11-22 DIAGNOSIS — Z01810 Encounter for preprocedural cardiovascular examination: Secondary | ICD-10-CM | POA: Diagnosis not present

## 2023-11-22 DIAGNOSIS — Z01812 Encounter for preprocedural laboratory examination: Secondary | ICD-10-CM

## 2023-11-22 DIAGNOSIS — D649 Anemia, unspecified: Secondary | ICD-10-CM | POA: Diagnosis not present

## 2023-11-22 DIAGNOSIS — E119 Type 2 diabetes mellitus without complications: Secondary | ICD-10-CM | POA: Diagnosis not present

## 2023-11-22 DIAGNOSIS — D509 Iron deficiency anemia, unspecified: Secondary | ICD-10-CM

## 2023-11-22 LAB — URINALYSIS, ROUTINE W REFLEX MICROSCOPIC
Bilirubin Urine: NEGATIVE
Glucose, UA: NEGATIVE mg/dL
Hgb urine dipstick: NEGATIVE
Ketones, ur: NEGATIVE mg/dL
Nitrite: NEGATIVE
Protein, ur: NEGATIVE mg/dL
Specific Gravity, Urine: 1.021 (ref 1.005–1.030)
pH: 5 (ref 5.0–8.0)

## 2023-11-22 LAB — SEDIMENTATION RATE: Sed Rate: 39 mm/h — ABNORMAL HIGH (ref 0–30)

## 2023-11-22 LAB — COMPREHENSIVE METABOLIC PANEL
ALT: 16 U/L (ref 0–44)
AST: 23 U/L (ref 15–41)
Albumin: 3.6 g/dL (ref 3.5–5.0)
Alkaline Phosphatase: 95 U/L (ref 38–126)
Anion gap: 8 (ref 5–15)
BUN: 11 mg/dL (ref 6–20)
CO2: 32 mmol/L (ref 22–32)
Calcium: 8.5 mg/dL — ABNORMAL LOW (ref 8.9–10.3)
Chloride: 97 mmol/L — ABNORMAL LOW (ref 98–111)
Creatinine, Ser: 1.04 mg/dL — ABNORMAL HIGH (ref 0.44–1.00)
GFR, Estimated: >60 mL/min (ref >60)
Glucose, Bld: 116 mg/dL — ABNORMAL HIGH (ref 70–99)
Potassium: 3.3 mmol/L — ABNORMAL LOW (ref 3.5–5.1)
Sodium: 137 mmol/L (ref 135–145)
Total Bilirubin: 0.4 mg/dL (ref 0.0–1.2)
Total Protein: 7.2 g/dL (ref 6.5–8.1)

## 2023-11-22 LAB — HEMOGLOBIN A1C
Hgb A1c MFr Bld: 6.4 % — ABNORMAL HIGH (ref 4.8–5.6)
Mean Plasma Glucose: 136.98 mg/dL

## 2023-11-22 LAB — SURGICAL PCR SCREEN
MRSA, PCR: NEGATIVE
Staphylococcus aureus: POSITIVE — AB

## 2023-11-22 LAB — CBC
HCT: 33.7 % — ABNORMAL LOW (ref 36.0–46.0)
Hemoglobin: 11.4 g/dL — ABNORMAL LOW (ref 12.0–15.0)
MCH: 29.5 pg (ref 26.0–34.0)
MCHC: 33.8 g/dL (ref 30.0–36.0)
MCV: 87.3 fL (ref 80.0–100.0)
Platelets: 233 10*3/uL (ref 150–400)
RBC: 3.86 MIL/uL — ABNORMAL LOW (ref 3.87–5.11)
RDW: 15.7 % — ABNORMAL HIGH (ref 11.5–15.5)
WBC: 8.7 10*3/uL (ref 4.0–10.5)
nRBC: 0 % (ref 0.0–0.2)

## 2023-11-22 LAB — C-REACTIVE PROTEIN: CRP: 1.4 mg/dL — ABNORMAL HIGH (ref <1.0)

## 2023-11-22 NOTE — Patient Instructions (Addendum)
 Your procedure is scheduled on: 12/01/23 - Friday Report to the Registration Desk on the 1st floor of the [facility]. To find out your arrival time, please call [phone] between 1PM - 3PM on: 11/30/23 - Thursday If your arrival time is 6:00 am, do not arrive before that time as the [facility] entrance doors do not open until 6:00 am.  REMEMBER: Instructions that are not followed completely may result in serious medical risk, up to and including death; or upon the discretion of your surgeon and anesthesiologist your surgery may need to be rescheduled.  Do not eat food after midnight the night before surgery.  No gum chewing or hard candies.  You may however, drink water up to 2 hours before you are scheduled to arrive for your surgery. Do not drink anything within 2 hours of your scheduled arrival time.  In addition, your doctor has ordered for you to drink the provided:  Gatorade G2 Drinking this carbohydrate drink up to two hours before surgery helps to reduce insulin resistance and improve patient outcomes. Please complete drinking 2 hours before scheduled arrival time.  One week prior to surgery: Stop Anti-inflammatories (NSAIDS) such as Advil, Aleve, Ibuprofen, Motrin, Naproxen, Naprosyn and Aspirin based products such as Excedrin, Goody's Powder, BC Powder. You may take Tylenol if needed for pain up until the day of surgery.  Stop ANY OVER THE COUNTER supplements until after surgery.  ON THE DAY OF SURGERY ONLY TAKE THESE MEDICATIONS WITH SIPS OF WATER:  escitalopram (LEXAPRO)  lacosamide (VIMPAT)  levothyroxine (SYNTHROID)  LORazepam (ATIVAN) if needed methadone (DOLOPHINE) if needed   No Alcohol for 24 hours before or after surgery.  No Smoking including e-cigarettes for 24 hours before surgery.  No chewable tobacco products for at least 6 hours before surgery.  No nicotine patches on the day of surgery.  Do not use any "recreational" drugs for at least a week  (preferably 2 weeks) before your surgery.  Please be advised that the combination of cocaine and anesthesia may have negative outcomes, up to and including death. If you test positive for cocaine, your surgery will be cancelled.  On the morning of surgery brush your teeth with toothpaste and water, you may rinse your mouth with mouthwash if you wish. Do not swallow any toothpaste or mouthwash.  Use CHG Soap or wipes as directed on instruction sheet.  Do not wear jewelry, make-up, hairpins, clips or nail polish.  For welded (permanent) jewelry: bracelets, anklets, waist bands, etc.  Please have this removed prior to surgery.  If it is not removed, there is a chance that hospital personnel will need to cut it off on the day of surgery.  Do not wear lotions, powders, or perfumes.   Do not shave body hair from the neck down 48 hours before surgery.  Contact lenses, hearing aids and dentures may not be worn into surgery.  Do not bring valuables to the hospital. [facility] is not responsible for any missing/lost belongings or valuables.   Bring your C-PAP to the hospital in case you may have to spend the night.   Notify your doctor if there is any change in your medical condition (cold, fever, infection).  Wear comfortable clothing (specific to your surgery type) to the hospital.  After surgery, you can help prevent lung complications by doing breathing exercises.  Take deep breaths and cough every 1-2 hours. Your doctor may order a device called an Incentive Spirometer to help you take deep  breaths.  When coughing or sneezing, hold a pillow firmly against your incision with both hands. This is called "splinting." Doing this helps protect your incision. It also decreases belly discomfort.  If you are being admitted to the hospital overnight, leave your suitcase in the car. After surgery it may be brought to your room.  In case of increased patient census, it may be necessary for you, the  patient, to continue your postoperative care in the Same Day Surgery department.  If you are being discharged the day of surgery, you will not be allowed to drive home. You will need a responsible individual to drive you home and stay with you for 24 hours after surgery.   If you are taking public transportation, you will need to have a responsible individual with you.  Please call the Pre-admissions Testing Dept. at [phone] if you have any questions about these instructions.  Surgery Visitation Policy:  Patients having surgery or a procedure may have two visitors.  Children under the age of 74 must have an adult with them who is not the patient.  Temporary Visitor Restrictions Due to increasing cases of flu, RSV and COVID-19: Children ages 64 and under will not be able to visit patients in [facility] hospitals under most circumstances.  Inpatient Visitation:    Visiting hours are 7 a.m. to 8 p.m. Up to four visitors are allowed at one time in a patient room. The visitors may rotate out with other people during the day.  One visitor age 30 or older may stay with the patient overnight and must be in the room by 8 p.m.    Pre-operative 5 CHG Bath Instructions   You can play a key role in reducing the risk of infection after surgery. Your skin needs to be as free of germs as possible. You can reduce the number of germs on your skin by washing with CHG (chlorhexidine gluconate) soap before surgery. CHG is an antiseptic soap that kills germs and continues to kill germs even after washing.   DO NOT use if you have an allergy to chlorhexidine/CHG or antibacterial soaps. If your skin becomes reddened or irritated, stop using the CHG and notify one of our RNs at [phone].   Please shower with the CHG soap starting 4 days before surgery using the following schedule:  03/24 - 03/28.    Please keep in mind the following:  DO NOT shave, including legs and underarms, starting the  day of your first shower.   You may shave your face at any point before/day of surgery.  Place clean sheets on your bed the day you start using CHG soap. Use a clean washcloth (not used since being washed) for each shower. DO NOT sleep with pets once you start using the CHG.   CHG Shower Instructions:  If you choose to wash your hair and private area, wash first with your normal shampoo/soap.  After you use shampoo/soap, rinse your hair and body thoroughly to remove shampoo/soap residue.  Turn the water OFF and apply about 3 tablespoons (45 ml) of CHG soap to a CLEAN washcloth.  Apply CHG soap ONLY FROM YOUR NECK DOWN TO YOUR TOES (washing for 3-5 minutes)  DO NOT use CHG soap on face, private areas, open wounds, or sores.  Pay special attention to the area where your surgery is being performed.  If you are having back surgery, having someone wash your back for you may be helpful. Wait 2 minutes  after CHG soap is applied, then you may rinse off the CHG soap.  Pat dry with a clean towel  Put on clean clothes/pajamas   If you choose to wear lotion, please use ONLY the CHG-compatible lotions on the back of this paper.     Additional instructions for the day of surgery: DO NOT APPLY any lotions, deodorants, cologne, or perfumes.   Put on clean/comfortable clothes.  Brush your teeth.  Ask your nurse before applying any prescription medications to the skin.      CHG Compatible Lotions   Aveeno Moisturizing lotion  Cetaphil Moisturizing Cream  Cetaphil Moisturizing Lotion  Clairol Herbal Essence Moisturizing Lotion, Dry Skin  Clairol Herbal Essence Moisturizing Lotion, Extra Dry Skin  Clairol Herbal Essence Moisturizing Lotion, Normal Skin  Curel Age Defying Therapeutic Moisturizing Lotion with Alpha Hydroxy  Curel Extreme Care Body Lotion  Curel Soothing Hands Moisturizing Hand Lotion  Curel Therapeutic Moisturizing Cream, Fragrance-Free  Curel Therapeutic Moisturizing Lotion,  Fragrance-Free  Curel Therapeutic Moisturizing Lotion, Original Formula  Eucerin Daily Replenishing Lotion  Eucerin Dry Skin Therapy Plus Alpha Hydroxy Crme  Eucerin Dry Skin Therapy Plus Alpha Hydroxy Lotion  Eucerin Original Crme  Eucerin Original Lotion  Eucerin Plus Crme Eucerin Plus Lotion  Eucerin TriLipid Replenishing Lotion  Keri Anti-Bacterial Hand Lotion  Keri Deep Conditioning Original Lotion Dry Skin Formula Softly Scented  Keri Deep Conditioning Original Lotion, Fragrance Free Sensitive Skin Formula  Keri Lotion Fast Absorbing Fragrance Free Sensitive Skin Formula  Keri Lotion Fast Absorbing Softly Scented Dry Skin Formula  Keri Original Lotion  Keri Skin Renewal Lotion Keri Silky Smooth Lotion  Keri Silky Smooth Sensitive Skin Lotion  Nivea Body Creamy Conditioning Oil  Nivea Body Extra Enriched Lotion  Nivea Body Original Lotion  Nivea Body Sheer Moisturizing Lotion Nivea Crme  Nivea Skin Firming Lotion  NutraDerm 30 Skin Lotion  NutraDerm Skin Lotion  NutraDerm Therapeutic Skin Cream  NutraDerm Therapeutic Skin Lotion  ProShield Protective Hand Cream  Provon moisturizing lotion  How to Use an Incentive Spirometer  An incentive spirometer is a tool that measures how well you are filling your lungs with each breath. Learning to take long, deep breaths using this tool can help you keep your lungs clear and active. This may help to reverse or lessen your chance of developing breathing (pulmonary) problems, especially infection. You may be asked to use a spirometer: After a surgery. If you have a lung problem or a history of smoking. After a long period of time when you have been unable to move or be active. If the spirometer includes an indicator to show the highest number that you have reached, your health care provider or respiratory therapist will help you set a goal. Keep a log of your progress as told by your health care provider. What are the  risks? Breathing too quickly may cause dizziness or cause you to pass out. Take your time so you do not get dizzy or light-headed. If you are in pain, you may need to take pain medicine before doing incentive spirometry. It is harder to take a deep breath if you are having pain. How to use your incentive spirometer  Sit up on the edge of your bed or on a chair. Hold the incentive spirometer so that it is in an upright position. Before you use the spirometer, breathe out normally. Place the mouthpiece in your mouth. Make sure your lips are closed tightly around it. Breathe in slowly and as deeply  as you can through your mouth, causing the piston or the ball to rise toward the top of the chamber. Hold your breath for 3-5 seconds, or for as long as possible. If the spirometer includes a coach indicator, use this to guide you in breathing. Slow down your breathing if the indicator goes above the marked areas. Remove the mouthpiece from your mouth and breathe out normally. The piston or ball will return to the bottom of the chamber. Rest for a few seconds, then repeat the steps 10 or more times. Take your time and take a few normal breaths between deep breaths so that you do not get dizzy or light-headed. Do this every 1-2 hours when you are awake. If the spirometer includes a goal marker to show the highest number you have reached (best effort), use this as a goal to work toward during each repetition. After each set of 10 deep breaths, cough a few times. This will help to make sure that your lungs are clear. If you have an incision on your chest or abdomen from surgery, place a pillow or a rolled-up towel firmly against the incision when you cough. This can help to reduce pain while taking deep breaths and coughing. General tips When you are able to get out of bed: Walk around often. Continue to take deep breaths and cough in order to clear your lungs. Keep using the incentive spirometer until  your health care provider says it is okay to stop using it. If you have been in the hospital, you may be told to keep using the spirometer at home. Contact a health care provider if: You are having difficulty using the spirometer. You have trouble using the spirometer as often as instructed. Your pain medicine is not giving enough relief for you to use the spirometer as told. You have a fever. Get help right away if: You develop shortness of breath. You develop a cough with bloody mucus from the lungs. You have fluid or blood coming from an incision site after you cough. Summary An incentive spirometer is a tool that can help you learn to take long, deep breaths to keep your lungs clear and active. You may be asked to use a spirometer after a surgery, if you have a lung problem or a history of smoking, or if you have been inactive for a long period of time. Use your incentive spirometer as instructed every 1-2 hours while you are awake. If you have an incision on your chest or abdomen, place a pillow or a rolled-up towel firmly against your incision when you cough. This will help to reduce pain. Get help right away if you have shortness of breath, you cough up bloody mucus, or blood comes from your incision when you cough. This information is not intended to replace advice given to you by your health care provider. Make sure you discuss any questions you have with your health care provider. Document Revised: 11/11/2019 Document Reviewed: 11/11/2019 Elsevier Patient Education  2023 [employer]   Preoperative Educational Videos for Total Hip, Knee and Shoulder Replacements  To better prepare for surgery, please view our videos that explain the physical activity and discharge planning required to have the best surgical recovery at [facility].  [email or web address]  Questions? Call [phone] or email  [email or web address]    POLAR CARE INFORMATION  MassAdvertisement.it  How to use [employer] Polar Care [facility] Therapy System?  YouTube   [email or web address]  OPERATING INSTRUCTIONS  Start the  product With dry hands, connect the transformer to the electrical connection located on the top of the cooler. Next, plug the transformer into an appropriate electrical outlet. The unit will automatically start running at this point.  To stop the pump, disconnect electrical power.  Unplug to stop the product when not in use. Unplugging the Polar Care unit turns it off. Always unplug immediately after use. Never leave it plugged in while unattended. Remove pad.    FIRST ADD WATER TO FILL LINE, THEN ICE---Replace ice when existing ice is almost melted  1 Discuss Treatment with your Licensed Health Care Practitioner and Use Only as Prescribed 2 Apply Insulation Barrier & Cold Therapy Pad 3 Check for Moisture 4 Inspect Skin Regularly  Tips and Trouble Shooting Usage Tips 1. Use cubed or chunked ice for optimal performance. 2. It is recommended to drain the Pad between uses. To drain the pad, hold the Pad upright with the hose pointed toward the ground. Depress the black plunger and allow water to drain out. 3. You may disconnect the Pad from the unit without removing the pad from the affected area by depressing the silver tabs on the hose coupling and gently pulling the hoses apart. The Pad and unit will seal itself and will not leak. Note: Some dripping during release is normal. 4. DO NOT RUN PUMP WITHOUT WATER! The pump in this unit is designed to run with water. Running the unit without water will cause permanent damage to the pump. 5. Unplug unit before removing lid.  TROUBLESHOOTING GUIDE Pump not running, Water not flowing to the pad, Pad is not getting cold 1. Make sure the transformer is plugged into the wall outlet. 2. Confirm that the ice and water are filled to  the indicated levels. 3. Make sure there are no kinks in the pad. 4. Gently pull on the blue tube to make sure the tube/pad junction is straight. 5. Remove the pad from the treatment site and ll it while the pad is lying at; then reapply. 6. Confirm that the pad couplings are securely attached to the unit. Listen for the double clicks (Figure 1) to confirm the pad couplings are securely attached.  Leaks    Note: Some condensation on the lines, controller, and pads is unavoidable, especially in warmer climates. 1. If using a Breg Polar Care Cold Therapy unit with a detachable Cold Therapy Pad, and a leak exists (other than condensation on the lines) disconnect the pad couplings. Make sure the silver tabs on the couplings are depressed before reconnecting the pad to the pump hose; then confirm both sides of the coupling are properly clicked in. 2. If the coupling continues to leak or a leak is detected in the pad itself, stop using it and call [employer] Customer Care at [phone].  Cleaning After use, empty and dry the unit with a soft cloth. Warm water and mild detergent may be used occasionally to clean the pump and tubes.  WARNING: The Polar Care Cube can be cold enough to cause serious injury, including full skin necrosis. Follow these Operating Instructions, and carefully read the Product Insert (see pouch on side of unit) and the Cold Therapy Pad Fitting Instructions (provided with each Cold Therapy Pad) prior to use.

## 2023-11-23 ENCOUNTER — Encounter: Payer: Medicare HMO | Admitting: Occupational Therapy

## 2023-11-24 DIAGNOSIS — M1711 Unilateral primary osteoarthritis, right knee: Secondary | ICD-10-CM | POA: Diagnosis not present

## 2023-11-24 LAB — URINE CULTURE: Culture: <10000 — AB

## 2023-11-27 ENCOUNTER — Encounter: Payer: Self-pay | Admitting: Orthopedic Surgery

## 2023-11-27 NOTE — Progress Notes (Signed)
 Perioperative Services  Pre-Admission/Anesthesia Testing Clinical Review  Date: 11/27/23  Patient Demographics:  Name: Brittany Nguyen DOB:   1966-03-29 MRN:   [id number]  Planned Surgical Procedure(s):    Case: [id number] Date/Time: 08/24/22 0700   Procedure: COMPUTER ASSISTED TOTAL KNEE ARTHROPLASTY (Left: Knee)   Anesthesia type: Choice   Pre-op diagnosis: PRIMARY OSTEOARTHRITIS OF LEFT KNEE.   Location: [facility] OR ROOM 01 / [facility]S FOR ANESTHESIA [facility]   Surgeons: Donato Heinz, MD   NOTE: Available PAT nursing documentation and vital signs have been reviewed. Clinical nursing staff has updated patient's PMH/PSHx, current medication list, and drug allergies/intolerances to ensure comprehensive history available to assist in medical decision making as it pertains to the aforementioned surgical procedure and anticipated anesthetic course. Extensive review of available clinical information performed. [place] PM[facility] and PSHx updated with any diagnoses/procedures that  may have been inadvertently omitted during her intake with the pre-admission testing department's nursing staff.  Clinical Discussion:  Brittany Nguyen is a 58 y.o. female who is submitted for pre-surgical anesthesia review and clearance prior to her undergoing the above procedure. Patient has never been a smoker. Pertinent PMH includes: CAD, anterolateral MI, PAF, NICM (s/p AICD placement), HFrEF, unstable angina, prolonged QT interval, ILBBB, DVT, T2DM, hypothyroidism, asthma, COPD, acute on chronic respiratory failure, OSAH (requires nocturnal PAP therapy), GERD (no daily Tx), seizures, IDA, neuropathy, nephrolithiasis, neuroendocrine tumor (stomach), right breast cancer, OA, RLS, chronic opioid therapy, anxiety (on BZO), depression.  Patient is followed by cardiology Wonda Cheng, MD). She was last seen in the cardiology clinic on 08/22/2023; notes reviewed.  At the time of her clinic visit, patient complained of chest  pressure with (+) radiation into her neck and jaw areas. She had some mild associated shortness of breath. Sometimes aforementioned symptoms were associated with diaphoresis, which was reported to occur at both rest and with exertion. Patient denied any PND, orthopnea, palpitations, significant peripheral edema, weakness, fatigue, vertiginous symptoms, or presyncope/syncope. Patient with a past medical history significant for cardiovascular diagnoses. Documented physical exam was grossly benign, providing no evidence of acute exacerbation and/or decompensation of the patient's known cardiovascular conditions.   Patient underwent diagnostic LEFT heart catheterization on 10/21/2014.  Coronary anatomy noted be normal with no evidence of obstructive CAD.  Patient diagnosed with nonischemic cardiomyopathy and 12/2016, at which time her EF was 25-30% by echocardiogram.  She underwent diagnostic RIGHT/LEFT heart catheterization on 02/24/2017, again revealing normal coronary anatomy and no evidence of obstructive CAD.  Hemodynamics: Mean RA = 12 mmHg, mean PA = 28 mmHg, mean PCWP = 15 mmHg; LVEDP = 16 mmHg, CO = 8 L/min, CI = 3.8 L/min/m.  Given worsening ejection fraction associated with her known NICM, patient underwent placement of a Medtronic Evera MRI XT DR SureScan AICD device on 03/29/2017.  Device is regularly interrogated by her electrophysiology team.  Device last interrogated on 10/02/2023 at which time it was noted to be functioning properly.  Patient is not pacemaker dependent.  Cardiac MRI performed on 11/28/2017 provided a limited examination due to susceptibility artifact caused by the implanted AICD to her LEFT upper chest wall. Decreased qualitative contractility of the left ventricle noted as evidenced by hypokinesis and dyskinetic anteroseptal, inferoseptal, and inferior walls.   Most recent TTE was performed on 06/14/2022 revealing a severely decreased left ventricular systolic function with  an EF of 25-30%.  There were no regional wall motion abnormalities. Left ventricular diastolic Doppler parameters consistent with abnormal relaxation (G1DD).  Right ventricular  size and function normal.  There was trivial mitral valve regurgitation. All transvalvular gradients were noted to be normal providing no evidence suggestive of valvular stenosis. Aorta normal in size with appreciable ectasia or aneurysmal dilatation.  Cardiac PET scan was performed on 08/29/2023 feeling a large sized, moderately severe, fixed defect involving the apical, apical septal, mid anteroseptal, mid inferoseptal, basal anteroseptal, and basal inferoseptal segments.  There were no significant coronary calcifications noted on the attenuation correction imaging.  EF was normal at 57%.  There was no evidence of significant reversible ischemia.  In the absence of coronary calcification, significance of findings unclear.  Recommended to explore other etiologies of fixed focal defect.  Blood pressure documented as being low at 97/74 mmHg.  For her HFrEF and NICM diagnoses, patient is on diuretic (torsemide), ARB (losartan), and beta-blocker (metoprolol tartrate) therapies.  Patient not currently taking any type of lipid-lowering therapies for ASCVD prevention.  T2DM well-controlled on currently prescribed regimen; last HgbA1c was 6.4 % when checked on 08/21/2023.  Patient does have an OSAH diagnosis, however is not always compliant with her prescribed nocturnal PAP therapy.  Functional capacity limited by arthritides and multiple medical comorbidities.  Patient is able to complete all of her ADLs/IADLs without significant cardiovascular limitation.  Per the DASI, patient felt to be able to achieve at least 4 METS of physical activity without experiencing any significant angina/anginal equivalent symptoms.  No changes were made to her medication regimen.  Patient to follow-up with outpatient cardiology/electrophysiology/heart failure  clinic in 6 months or sooner if needed.  Patient also followed by neurology for a history of generalized epilepsy. She was last seen in the neurology clinic on 12/21/2022; notes reviewed. Patient had experienced multiple breakthrough seizures despite treatment with lacosamide.  Documented seizures experienced on 11/07/2021, 01/15/2022, 02/14/2022, 03/01/2022, 07/30/2022, and 09/19/2022.  Most recent seizure activity was on 11/13/2023 in the setting of a phlebotomy prior to infusion in the cancer center.  Patient experience (+) precipitating aura and reported to staff that she felt generally unwell.  Patient was lowered to the ground safely prior to seizure-like activity beginning.  Patient postictal following seizure activity.  She was taken to the ED for further evaluation.  CT imaging of the head was negative for any acute intracranial processes.  Lacosamide level found to be elevated at 17.7 ug/mL.  Electrolytes were all unremarkable. Patient ultimately discharged home in stable condition with plans for outpatient follow-up with neurology.  Brittany Miller Junkins is scheduled for an elective COMPUTER ASSISTED TOTAL KNEE ARTHROPLASTY (Right: Knee) on 12/01/2023 with Dr. Francesco Sor, MD. Given patient's past medical history significant for cardiovascular and neurological diagnoses, pre-procedural/preoperative clearances were requested from both neurology and cardiology. Specialty clearances were obtained as follows:  Per neurology, "this patient is optimized for surgery and may proceed with the planned procedural course with a LOW risk of significant perioperative neurological complications".  Per cardiology, "findings from recent cardiac PET discussed with her primary cardiology provider and interventional cardiology.  No coronary calcifications noted.  Patient had a normal coronary angiogram in 2018. Patient may proceed with elective knee surgery and an overall MODERATE risk of significant perioperative  cardiovascular complications.  We will plan on sarcoid evaluation with cardiac PET postoperatively".   In review of her medication reconciliation, the patient is not noted to be taking any type of anticoagulation or antiplatelet therapies that would need to be held during her perioperative course. Patient denies previous perioperative complications with anesthesia in the past. Patient has  a PMH (+) for PONV. Symptoms and history of PONV will be discussed with patient by anesthesia team on the day of her procedure. Interventions will be ordered as deemed necessary based on patient's individual care needs as determined by anesthesiologist. In review of the available records, it is noted that patient underwent a neuraxial anesthetic course here at [facility] ([facility]) in 08/2022 without documented complications.      11/22/2023    2:53 PM 11/19/2023    3:00 AM 11/19/2023    1:46 AM  Vitals with BMI  Weight 216 lbs 8 oz    BMI 36.03    Systolic 127 116 96  Diastolic 91 77 80  Pulse 86 88 100    Providers/Specialists:   NOTE: Primary physician provider listed below. Patient may have been seen by APP or partner within same practice.   PROVIDER ROLE / SPECIALTY LAST OV  Hooten, Illene Labrador, MD Orthopedics (Surgeon) 11/24/2023  Jerl Mina, MD Primary Care Provider 10/11/2023  Tobie Poet, MD Cardiology 08/22/2023  Earl Many, MD Oncology 10/16/2023  Cristopher Peru, MD Neurology 04/11/2023   Allergies:  Contrast media [iodinated contrast media], Ferumoxytol, Iron sucrose, Lidocaine, Metrizamide, Penicillins, Sacubitril-valsartan, Isosorbide nitrate, Latex, Ondansetron, Tizanidine, Povidone-iodine, and Pulmicort [budesonide]  Current Home Medications:   No current facility-administered medications for this encounter.    escitalopram (LEXAPRO) 10 MG tablet   gabapentin (NEURONTIN) 300 MG capsule   lacosamide (VIMPAT) 200 MG TABS tablet   levothyroxine  (SYNTHROID) 175 MCG tablet   LORazepam (ATIVAN) 1 MG tablet   [Paused] losartan (COZAAR) 25 MG tablet   Menthol-Methyl Salicylate (MUSCLE RUB) 10-15 % CREA   methadone (DOLOPHINE) 5 MG tablet   [Paused] metoprolol tartrate (LOPRESSOR) 50 MG tablet   oxyCODONE-acetaminophen (PERCOCET) 7.5-325 MG tablet   promethazine (PHENERGAN) 25 MG tablet   rOPINIRole (REQUIP) 4 MG tablet   [Paused] torsemide (DEMADEX) 20 MG tablet   History:   Past Medical History:  Diagnosis Date   Acute anterolateral wall MI (HCC) 08/2016   Acute respiratory failure (HCC)    AICD (automatic cardioverter/defibrillator) present 03/29/2017   a.) Medtronic Evera MRI XT DR SureScan (SN: [id number])   Anxiety    a.) on BZO PRN (lorazepam)   Arthritis    Asthma 2013   Breast cancer, right (HCC)    C. 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   a.) Bx 02/22/2018 --> NE tumor cells --> AE1/AE3, chromogranin, synaptophysin (+); Ki-67 proliferation rate <1%; b.) repeat Bx 08/24/2018 --> well differentiard NET (G1)   CHF (congestive heart failure) (HCC)    Chronic, continuous use of opioids    a.) has naloxone Rx available   COPD (chronic obstructive pulmonary disease) (HCC)    Coronary artery disease    Depression    Diet-controlled type 2 diabetes mellitus (HCC)    Diverticulitis 2010   DVT (deep venous thrombosis) (HCC)    Endometriosis 1990   Generalized epilepsy (HCC)    a.) on lacosamide; b.) seizure 07-30-22 in setting of hypokalemia (K+ 2.6); c.) last seizure 11/13/2023 during blood draw   GERD (gastroesophageal reflux disease)    GIB (gastrointestinal bleeding) 01/08/2018   H/O syncope    HFrEF (heart failure with reduced ejection fraction) (HCC)    a.)TTE 12/09/16: EF 25-30%, dif HK; b.) TTE 02/08/17: EF 25%, dif HK, mild TR, G1DD; c.) TTE 04/25/17: EF 30-35%, mild LVH; d.) TTE 01/30/18: EF 45%, MAC, LAE, mild  MR, G1DD; e.) TTE 11/06/2018: EF 25%, sev glob HK, triv  MR/TR/PR; f.) TTE 05/12/19: EF 35%, MAC, AoV sclerosis, G1DD; g.) TTE 11/05/19: EF 20%, sev glob HK, triv MR; h.) TTE 06/17/20: EF 40%, mod glob HK, triv MR; I:) TTE 06/14/22: EF 25-30%, G1DD   History of cardiac catheterization    a.) LHC 10/21/2014: normal coronaries; b.) R/LHC 02/24/2017: normal coronaries, mRA 12, mPA 28, mPCWP 15, CO 8.0, CI 3.8   History of kidney stones    Hx MRSA infection    Hypertension    Hypokalemia    Hypothyroidism    a.) s/p thyroidectomy   Incomplete left bundle branch block (LBBB)    Iron deficiency anemia 03/27/2018   Lump or mass in breast 11/27/2012   a.) Bx 11/27/2012 --> apocrine wall cyst   Migraine    Nausea & vomiting    Neuropathy    Non-ischemic cardiomyopathy (HCC)    a.) TTE 12/09/2016: EF 25-30%, b.) TTE 02/08/2017: EF 25%; c.) Medtronic AICD placed 03/29/2017; d.) TTE 04/25/2017: EF 30-35%; e.) TTE 01/30/2018: EF 45%; f.) TTE 11/06/2018: EF 25%; g.) TTE 05/12/2019: EF 35%; h.) TTE 11/05/2019: EF 20%; I.) TTE 06/17/2020: EF 40%; J.) TTE 06/14/2022: EF 25-30%   Obesity    OSA on CPAP    PAF (paroxysmal atrial fibrillation) (HCC)    Pneumonia    PONV (postoperative nausea and vomiting)    Post-COVID chronic cough    Postcholecystectomy diarrhea    Prolonged Q-T interval on ECG    RAD (reactive airway disease)    Restless leg    a.) on ropinirole   Sepsis (HCC)    Unstable angina (HCC)    Past Surgical History:  Procedure Laterality Date   ABDOMINAL HYSTERECTOMY     age 57   BREAST BIOPSY Right 2014   benign   BREAST BIOPSY Right 01/18/2023   [place] Core Bx, coil clip - path pending   BREAST BIOPSY Right 01/18/2023   [place] RT BREAST BX W LOC DEV 1ST LESION IMG BX SPEC US GUIDE 01/18/2023 [facility]   CARDIAC DEFIBRILLATOR PLACEMENT Left 03/29/2017   Procedure: MEDTRONIC CARDIAC DEFIBRILLATOR PLACEMENT (AICD); Location: [facility]; Surgeon: Bonne Dolores, MD   CESAREAN SECTION     CHOLECYSTECTOMY     COLECTOMY     COLONOSCOPY WITH PROPOFOL  N/A 02/22/2018   Procedure: COLONOSCOPY WITH PROPOFOL;  Surgeon: Toledo, Boykin Nearing, MD;  Location: [facility];  Service: Gastroenterology;  Laterality: N/A;   ESOPHAGOGASTRODUODENOSCOPY (EGD) WITH PROPOFOL N/A 02/22/2018   Procedure: ESOPHAGOGASTRODUODENOSCOPY (EGD) WITH PROPOFOL;  Surgeon: Toledo, Boykin Nearing, MD;  Location: [facility];  Service: Gastroenterology;  Laterality: N/A;   IVC FILTER INSERTION N/A 08/23/2022   Procedure: IVC FILTER INSERTION;  Surgeon: Renford Dills, MD;  Location: [facility]ASIVE [facility] LAB;  Service: Cardiovascular;  Laterality: N/A;   IVC FILTER REMOVAL N/A 03/07/2023   Procedure: IVC FILTER REMOVAL;  Surgeon: Renford Dills, MD;  Location: [facility];  Service: Cardiovascular;  Laterality: N/A;   KNEE ARTHROPLASTY Left 08/24/2022   Procedure: COMPUTER ASSISTED TOTAL KNEE ARTHROPLASTY;  Surgeon: Donato Heinz, MD;  Location: [facility] ORS;  Service: Orthopedics;  Laterality: Left;   LEFT HEART CATH AND CORONARY ANGIOGRAPHY Left 10/21/2014   Procedure: LEFT HEART CATH AND CORONARY ANGIOGRAPHY; Location: [facility]; Surgeon: Arnoldo Hooker, MD   NASAL SINUS SURGERY  2012   OOPHORECTOMY     RIGHT/LEFT HEART CATH AND CORONARY ANGIOGRAPHY Bilateral 02/24/2017   Procedure: RIGHT/LEFT HEART CATH AND  CORONARY ANGIOGRAPHY; Location: [facility]   THYROIDECTOMY     Family History  Problem Relation Age of Onset   Breast cancer Mother    Cancer Mother 78       ovarian   CAD Mother    Cancer Father 53       brain   CAD Father    Cancer Daughter 40       skin   Breast cancer Maternal Aunt    Cancer Maternal Aunt 69       breast   Leukemia Paternal Grandfather    Breast cancer Cousin    Social History   Tobacco Use   Smoking status: Never   Smokeless tobacco: Never  Vaping Use   Vaping status: Never Used  Substance Use Topics   Alcohol use: No   Drug use: Never    Pertinent Clinical Results:  LABS: Labs reviewed: Acceptable for surgery.  No visits  with results within 3 Day(s) from this visit.  Latest known visit with results is:  Hospital Outpatient Visit on 11/22/2023  Component Date Value Ref Range Status   MRSA, PCR 11/22/2023 NEGATIVE  NEGATIVE Final   Staphylococcus aureus 11/22/2023 POSITIVE (A)  NEGATIVE Final   Comment: (NOTE) The Xpert SA Assay (FDA approved for NASAL specimens in patients 29 years of age and older), is one component of a comprehensive surveillance program. It is not intended to diagnose infection nor to guide or monitor treatment. Performed at [facility], [address] [place], [place] [address]    CRP 11/22/2023 1.4 (H)  <1.0 mg/dL Final   Performed at [facility], [address], [place], [place] [address]   Sed Rate 11/22/2023 39 (H)  0 - 30 mm/hr Final   Performed at [facility], [address] [place], [place] [address]   Hgb A1c MFr Bld 11/22/2023 6.4 (H)  4.8 - 5.6 % Final   Comment: (NOTE) Pre diabetes:          5.7%-6.4%  Diabetes:              >6.4%  Glycemic control for   <7.0% adults with diabetes    Mean Plasma Glucose 11/22/2023 136.98  mg/dL Final   Performed at [facility], [address] [place], [place] [address]   WBC 11/22/2023 8.7  4.0 - 10.5 K/uL Final   RBC 11/22/2023 3.86 (L)  3.87 - 5.11 MIL/uL Final   Hemoglobin 11/22/2023 11.4 (L)  12.0 - 15.0 g/dL Final   HCT 27/25/3664 33.7 (L)  36.0 - 46.0 % Final   MCV 11/22/2023 87.3  80.0 - 100.0 fL Final   MCH 11/22/2023 29.5  26.0 - 34.0 pg Final   MCHC 11/22/2023 33.8  30.0 - 36.0 g/dL Final   RDW 40/34/7425 15.7 (H)  11.5 - 15.5 % Final   Platelets 11/22/2023 233  150 - 400 K/uL Final   nRBC 11/22/2023 0.0  0.0 - 0.2 % Final   Performed at [facility], [address] [place], [place] [address]   Sodium 11/22/2023 137  135 - 145 mmol/L Final   Potassium 11/22/2023 3.3 (L)  3.5 - 5.1 mmol/L Final   Chloride 11/22/2023 97 (L)  98 - 111 mmol/L Final   CO2 11/22/2023 32  22  - 32 mmol/L Final   Glucose, Bld 11/22/2023 116 (H)  70 - 99 mg/dL Final   Glucose reference range applies only to samples taken after fasting for at  least 8 hours.   BUN 11/22/2023 11  6 - 20 mg/dL Final   Creatinine, Ser 11/22/2023 1.04 (H)  0.44 - 1.00 mg/dL Final   Calcium 65/78/4696 8.5 (L)  8.9 - 10.3 mg/dL Final   Total Protein 29/52/8413 7.2  6.5 - 8.1 g/dL Final   Albumin 24/40/1027 3.6  3.5 - 5.0 g/dL Final   AST 25/36/6440 23  15 - 41 U/L Final   ALT 11/22/2023 16  0 - 44 U/L Final   Alkaline Phosphatase 11/22/2023 95  38 - 126 U/L Final   Total Bilirubin 11/22/2023 0.4  0.0 - 1.2 mg/dL Final   GFR, Estimated 11/22/2023 >60  >60 mL/min Final   Comment: (NOTE) Calculated using the CKD-EPI Creatinine Equation (2021)    Anion gap 11/22/2023 8  5 - 15 Final   Performed at [facility], [address] [place], [place] [address]   Color, Urine 11/22/2023 YELLOW (A)  YELLOW Final   APPearance 11/22/2023 HAZY (A)  CLEAR Final   Specific Gravity, Urine 11/22/2023 1.021  1.005 - 1.030 Final   pH 11/22/2023 5.0  5.0 - 8.0 Final   Glucose, UA 11/22/2023 NEGATIVE  NEGATIVE mg/dL Final   Hgb urine dipstick 11/22/2023 NEGATIVE  NEGATIVE Final   Bilirubin Urine 11/22/2023 NEGATIVE  NEGATIVE Final   Ketones, ur 11/22/2023 NEGATIVE  NEGATIVE mg/dL Final   Protein, ur 59/56/3875 NEGATIVE  NEGATIVE mg/dL Final   Nitrite 64/33/2951 NEGATIVE  NEGATIVE Final   Leukocytes,Ua 11/22/2023 SMALL (A)  NEGATIVE Final   RBC / HPF 11/22/2023 0-5  0 - 5 RBC/hpf Final   WBC, UA 11/22/2023 11-20  0 - 5 WBC/hpf Final   Bacteria, UA 11/22/2023 RARE (A)  NONE SEEN Final   Squamous Epithelial / HPF 11/22/2023 11-20  0 - 5 /HPF Final   Mucus 11/22/2023 PRESENT   Final   Performed at [facility], [address] [place], [place] [address]    ECG: Date: 11/22/2023 Time ECG obtained: 1503 PM Rate: 69 bpm Rhythm:  Sinus rhythm with first degree AV block; ILBBB Axis (leads I and aVF):  Left Intervals: PR 240 ms. QRS 112 ms. QTc 481 ms. ST segment and T wave changes: Inferior and anterolateral T wave abnormalities.  Comparison: Similar to previous tracing obtained on 11/13/2023  IMAGING / PROCEDURES: DIAGNOSTIC RADIOGRAPHS OF RIGHT KNEE 3 VIEWS performed on 11/24/2023 Significant narrowing of the medial cartilage space with bone-on-bone articulation and associated varus alignment.   Osteophyte formation is noted.  Subchondral sclerosis is noted.   No evidence of fracture or dislocation.   DIAGNOSTIC RADIOGRAPHS OF CHEST PORTABLE 1 VIEW performed on 11/19/2023 Left chest wall ICD.  Stable cardiomegaly.  Low lung volumes accentuate pulmonary vascularity.  No focal consolidation, pleural effusion, or pneumothorax.  No displaced rib fractures.  PET CT MYOCARDIAL PERFUSION MULTIPLE performed on 08/29/2023 No significant reversible ischemia noted  There is a large sized, moderately severe, fixed defect involving the apical, apical septal, mid anteroseptal, mid inferoseptal, basal anteroseptal and basal inferoseptal segments. In absence of coronary calcification, significance of this finding in terms of infarct is unclear. Other etiologies for focal fixed defect should be considered.  No significant coronary calcifications were noted on the attenuation CT  Post stress the ejection fraction is calculated at 57%.  AICD is in place.  Status post close cystectomy   TRANSTHORACIC ECHOCARDIOGRAM performed on 06/14/2022 The left ventricle is normal in size with normal wall thickness.  The left ventricular systolic function  is severely decreased, LVEF is visually estimated at 25-30%.  There is grade I diastolic dysfunction (impaired relaxation).  The right ventricle is normal in size, with normal systolic function.   Impression and Plan:  Brittany Nguyen has been referred for pre-anesthesia review and clearance prior to her undergoing the planned anesthetic and procedural  courses. Available labs, pertinent testing, and imaging results were personally reviewed by me in preparation for upcoming operative/procedural course. [facility] medical record has been updated following extensive record review and patient interview with PAT staff.    This patient has been appropriately cleared by cardiology (MODERATE) and by neurology (LOW) with the individually indicated risk of patient experiencing significant perioperative complications. Completed perioperative prescription for cardiac device management documentation completed by primary cardiology team and placed on patient's chart for review by the surgical/anesthetic team on the day of her procedure. Electrophysiology indicating that procedure should not interfere with planned surgical procedure. Beyond normal perioperative cardiovascular monitoring, there are no recommendations from electrophysiology team that prompt further discussion/recommendations from industry representative.   Based on clinical review performed today (11/27/23), barring any significant acute changes in the patient's overall condition, it is anticipated that she will be able to proceed with the planned surgical intervention. Any acute changes in clinical condition may necessitate her procedure being postponed and/or cancelled. Patient will meet with anesthesia team (MD and/or CRNA) on the day of her procedure for preoperative evaluation/assessment. Questions regarding anesthetic course will be fielded at that time.    Pre-surgical instructions were reviewed with the patient during his PAT appointment, and questions were fielded to satisfaction by PAT clinical staff. She has been instructed on which medications that he will need to hold prior to surgery, as well as the ones that have been deemed safe/appropriate to take on the day of his procedure. As part of the general education provided by PAT, patient made aware both verbally and in writing, that she would  need to abstain from the use of any illegal substances during his perioperative course. She was advised that failure to follow the provided instructions could necessitate case cancellation or result in serious perioperative complications up to and including death. Patient encouraged to contact PAT and/or her surgeon's office to discuss any questions or concerns that may arise prior to surgery; verbalized understanding.    Quentin Mulling, MSN, APRN, FNP-C, CEN [facility]  Peri-operative Services Nurse Practitioner Phone: [phone] Fax: [phone] 11/27/23 9:41 AM  NOTE: This note has been prepared using Dragon dictation software. Despite my best ability to proofread, there is always the potential that unintentional transcriptional errors may still occur from this process.

## 2023-11-28 ENCOUNTER — Encounter: Payer: Medicare HMO | Admitting: Occupational Therapy

## 2023-11-29 ENCOUNTER — Encounter: Payer: Self-pay | Admitting: Orthopedic Surgery

## 2023-11-29 DIAGNOSIS — D649 Anemia, unspecified: Secondary | ICD-10-CM | POA: Diagnosis not present

## 2023-11-29 DIAGNOSIS — E538 Deficiency of other specified B group vitamins: Secondary | ICD-10-CM | POA: Diagnosis not present

## 2023-11-29 DIAGNOSIS — I5042 Chronic combined systolic (congestive) and diastolic (congestive) heart failure: Secondary | ICD-10-CM | POA: Diagnosis not present

## 2023-11-29 DIAGNOSIS — I110 Hypertensive heart disease with heart failure: Secondary | ICD-10-CM | POA: Diagnosis not present

## 2023-11-29 DIAGNOSIS — R569 Unspecified convulsions: Secondary | ICD-10-CM | POA: Diagnosis not present

## 2023-11-29 DIAGNOSIS — E890 Postprocedural hypothyroidism: Secondary | ICD-10-CM | POA: Diagnosis not present

## 2023-11-29 DIAGNOSIS — E876 Hypokalemia: Secondary | ICD-10-CM | POA: Diagnosis not present

## 2023-11-29 NOTE — Anesthesia Preprocedure Evaluation (Addendum)
 Anesthesia Evaluation  Patient identified by MRN, date of birth, ID band Patient awake    Reviewed: Allergy & Precautions, H&P , NPO status , Patient's Chart, lab work & pertinent test results  History of Anesthesia Complications (+) PONV and history of anesthetic complications  Airway Mallampati: III  TM Distance: >3 FB Neck ROM: full    Dental no notable dental hx.    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 In absence of coronary calcification, significance of this finding in terms of infarct is unclear. Other etiologies for focal fixed defect should be considered.  - No significant coronary calcifications were noted on the attenuation CT  - Post stress the ejection fraction is calculated at 57%.   ECHO 10/23: Summary   1. The left ventricle is normal in size with normal wall thickness.    2. The left ventricular systolic function is severely decreased, LVEF is  visually estimated at 25-30%.    3. There is grade I diastolic dysfunction (impaired relaxation).    4. The right ventricle is normal in size, with normal systolic function.     Neuro/Psych Seizures - (last seizure-like activity 11/13/2023 during blood draw),  PSYCHIATRIC  DISORDERS Anxiety Depression    Chronic, continuous use of opioids  She was last seen in the neurology clinic on 12/21/2022; notes reviewed. Patient had experienced multiple breakthrough seizures despite treatment with lacosamide.  Documented seizures experienced on 11/07/2021, 01/15/2022, 02/14/2022, 03/01/2022, 07/30/2022, and 09/19/2022.  Most recent seizure activity was on 11/13/2023 in the setting of a phlebotomy prior to infusion in the cancer center.  Patient experience (+) precipitating aura and reported to staff that she felt generally unwell.  Patient was lowered to the ground safely prior to seizure-like activity beginning.  Patient postictal following seizure activity.  She was taken to the ED for further evaluation.  CT imaging of the head was negative for any acute intracranial processes.  Lacosamide level found to be elevated at 17.7 ug/mL.  Electrolytes were all unremarkable. Neurology was consulted and stated these were likely non-epileptic events. Patient ultimately discharged home in stable condition with plans for outpatient follow-up with neurology.    GI/Hepatic Neg liver ROS,GERD  ,,H/o Malignant carcinoid tumor of the stomach     Endo/Other  diabetes, Well Controlled, Type 2Hypothyroidism    Renal/GU      Musculoskeletal   Abdominal  (+) + obese  Peds  Hematology  (+) Blood dyscrasia, anemia   Anesthesia Other Findings Pt had a seizure-like event at phlebotomy 3/10 of which chest compression were used.    PAT NOTE REVIEWED:   Per neurology, "this patient is optimized for surgery and may proceed with the planned procedural course with a LOW risk of significant perioperative neurological complications".    Per cardiology, "findings from recent cardiac PET discussed with her primary cardiology provider and  interventional cardiology.  No coronary calcifications noted.  Patient had a normal coronary angiogram in 2018. Patient may proceed with elective knee surgery and an  overall MODERATE risk of significant perioperative cardiovascular complications.  We will plan on sarcoid evaluation with cardiac PET postoperatively"   Past Medical History: 08/2016: Acute anterolateral wall MI (HCC) No date: Acute respiratory failure (HCC) 03/29/2017: AICD (automatic cardioverter/defibrillator) present     Comment:  a.) Medtronic Evera MRI XT DR SureScan (SN: [id number]) No date: Anxiety     Comment:  a.) on BZO PRN (lorazepam) No date: Arthritis 2013: Asthma No date: Breast cancer, right (HCC) 01/14/2018: C. difficile colitis 02/22/2018: Carcinoid tumor determined by biopsy of stomach     Comment:  a.) Bx 02/22/2018 --> NE tumor cells --> AE1/AE3,               chromogranin, synaptophysin (+); Ki-67 proliferation rate              <1%; b.) repeat Bx 08/24/2018 --> well differentiard NET               (G1) No date: CHF (congestive heart failure) (HCC) No date: Chronic, continuous use of opioids     Comment:  a.) has naloxone Rx available No date: COPD (chronic obstructive pulmonary disease) (HCC) No date: Coronary artery disease No date: Depression No date: Diet-controlled type 2 diabetes mellitus (HCC) 2010: Diverticulitis No date: DVT (deep venous thrombosis) (HCC) 1990: Endometriosis No date: Generalized epilepsy (HCC)     Comment:  a.) on lacosamide; b.) seizure 07-30-22 in setting of               hypokalemia (K+ 2.6); c.) last seizure 11/13/2023 during               blood draw No date: GERD (gastroesophageal reflux disease) 01/08/2018: GIB (gastrointestinal bleeding) No date: H/O syncope No date: HFrEF (heart failure with reduced ejection fraction) (HCC)     Comment:  a.)TTE 12/09/16: EF 25-30%, dif HK; b.) TTE 02/08/17: EF               25%, dif HK, mild TR, G1DD; c.) TTE 04/25/17: EF 30-35%,               mild LVH; d.) TTE 01/30/18: EF 45%, MAC, LAE, mild MR,               G1DD; e.) TTE 11/06/2018: EF 25%, sev glob HK, triv               MR/TR/PR; f.) TTE  05/12/19: EF 35%, MAC, AoV sclerosis,               G1DD; g.) TTE 11/05/19: EF 20%, sev glob HK, triv MR; h.)               TTE 06/17/20: EF 40%, mod glob HK, triv MR; I:) TTE               06/14/22: EF 25-30%, G1DD No date: History of cardiac catheterization     Comment:  a.) LHC 10/21/2014: normal coronaries; b.) [facility]               02/24/2017: normal coronaries, mRA 12, mPA 28, mPCWP 15,               CO 8.0, CI 3.8 No date: History of kidney stones No date: Hx MRSA infection No date: Hypertension No date: Hypokalemia No date: Hypothyroidism  Comment:  a.) s/p thyroidectomy No date: Incomplete left bundle branch block (LBBB) 03/27/2018: Iron deficiency anemia 11/27/2012: Lump or mass in breast     Comment:  a.) Bx 11/27/2012 --> apocrine wall cyst No date: Migraine No date: Nausea & vomiting No date: Neuropathy No date: Non-ischemic cardiomyopathy (HCC)     Comment:  a.) TTE 12/09/2016: EF 25-30%, b.) TTE 02/08/2017: EF               25%; c.) Medtronic AICD placed 03/29/2017; d.) TTE               04/25/2017: EF 30-35%; e.) TTE 01/30/2018: EF 45%; f.)               TTE 11/06/2018: EF 25%; g.) TTE 05/12/2019: EF 35%; h.)               TTE 11/05/2019: EF 20%; I.) TTE 06/17/2020: EF 40%; J.)               TTE 06/14/2022: EF 25-30% No date: Obesity No date: OSA on CPAP No date: PAF (paroxysmal atrial fibrillation) (HCC) No date: Pneumonia No date: PONV (postoperative nausea and vomiting) No date: Post-COVID chronic cough No date: Postcholecystectomy diarrhea No date: Prolonged Q-T interval on ECG No date: RAD (reactive airway disease) No date: Restless leg     Comment:  a.) on ropinirole No date: Sepsis (HCC) No date: Unstable angina (HCC)  Past Surgical History: No date: ABDOMINAL HYSTERECTOMY     Comment:  age 67 2014: BREAST BIOPSY; Right     Comment:  benign 01/18/2023: BREAST BIOPSY; Right     Comment:  [place] Core Bx, coil clip - path pending 01/18/2023: BREAST  BIOPSY; Right     Comment:  [place] RT BREAST BX W LOC DEV 1ST LESION IMG BX SPEC [place]               GUIDE 01/18/2023 [facility] 03/29/2017: CARDIAC DEFIBRILLATOR PLACEMENT; Left     Comment:  Procedure: MEDTRONIC CARDIAC DEFIBRILLATOR PLACEMENT               (AICD); Location: [facility]; Surgeon: Bonne Dolores, MD No date: CESAREAN SECTION No date: CHOLECYSTECTOMY No date: COLECTOMY 02/22/2018: COLONOSCOPY WITH PROPOFOL; N/A     Comment:  Procedure: COLONOSCOPY WITH PROPOFOL;  Surgeon: Toledo,               Boykin Nearing, MD;  Location: [facility];  Service:               Gastroenterology;  Laterality: N/A; 02/22/2018: ESOPHAGOGASTRODUODENOSCOPY (EGD) WITH PROPOFOL; N/A     Comment:  Procedure: ESOPHAGOGASTRODUODENOSCOPY (EGD) WITH               PROPOFOL;  Surgeon: Toledo, Boykin Nearing, MD;  Location:               [facility];  Service: Gastroenterology;  Laterality:               N/A; 08/23/2022: IVC FILTER INSERTION; N/A     Comment:  Procedure: IVC FILTER INSERTION;  Surgeon: Renford Dills, MD;  Location: [facility];  Service:              Cardiovascular;  Laterality: N/A; 03/07/2023: IVC FILTER REMOVAL; N/A     Comment:  Procedure: IVC FILTER REMOVAL;  Surgeon: Gilda Crease,  Latina Craver, MD;  Location: [facility];  Service:              Cardiovascular;  Laterality: N/A; 08/24/2022: KNEE ARTHROPLASTY; Left     Comment:  Procedure: COMPUTER ASSISTED TOTAL KNEE ARTHROPLASTY;                Surgeon: Donato Heinz, MD;  Location: [facility];                Service: Orthopedics;  Laterality: Left; 10/21/2014: LEFT HEART CATH AND CORONARY ANGIOGRAPHY; Left     Comment:  Procedure: LEFT HEART CATH AND CORONARY ANGIOGRAPHY;               Location: [facility]; Surgeon: Arnoldo Hooker, MD 2012: NASAL SINUS SURGERY No date: OOPHORECTOMY 02/24/2017: RIGHT/LEFT HEART CATH AND CORONARY ANGIOGRAPHY; Bilateral     Comment:  Procedure: RIGHT/LEFT HEART CATH AND  CORONARY               ANGIOGRAPHY; Location: [facility] No date: THYROIDECTOMY     Reproductive/Obstetrics negative OB ROS                             Anesthesia Physical Anesthesia Plan  ASA: 3  Anesthesia Plan: Spinal   Post-op Pain Management: Regional block* and Ofirmev IV (intra-op)*   Induction: Intravenous  PONV Risk Score and Plan: Propofol infusion  Airway Management Planned: Natural Airway  Additional Equipment:   Intra-op Plan:   Post-operative Plan:   Informed Consent: I have reviewed the patients History and Physical, chart, labs and discussed the procedure including the risks, benefits and alternatives for the proposed anesthesia with the patient or authorized representative who has indicated his/her understanding and acceptance.     Dental Advisory Given  Plan Discussed with: CRNA and Surgeon  Anesthesia Plan Comments: (Avoid lidocaine. Pt had bupivacaine previously with no noted reaction )        Anesthesia Quick Evaluation

## 2023-11-30 ENCOUNTER — Encounter: Payer: Medicare HMO | Admitting: Occupational Therapy

## 2023-12-01 ENCOUNTER — Encounter: Payer: Self-pay | Admitting: Orthopedic Surgery

## 2023-12-01 ENCOUNTER — Encounter
Admission: RE | Disposition: A | Discharge status: Home / Self Care | Payer: Self-pay | Source: Home / Self Care | Attending: Orthopedic Surgery

## 2023-12-01 ENCOUNTER — Other Ambulatory Visit: Payer: Self-pay

## 2023-12-01 ENCOUNTER — Observation Stay
Admission: RE | Admit: 2023-12-01 | Discharge: 2023-12-02 | Disposition: A | Discharge status: Home / Self Care | Attending: Orthopedic Surgery | Admitting: Orthopedic Surgery

## 2023-12-01 ENCOUNTER — Ambulatory Visit: Payer: Self-pay | Admitting: Urgent Care

## 2023-12-01 ENCOUNTER — Observation Stay

## 2023-12-01 DIAGNOSIS — Z79899 Other long term (current) drug therapy: Secondary | ICD-10-CM | POA: Diagnosis not present

## 2023-12-01 DIAGNOSIS — J449 Chronic obstructive pulmonary disease, unspecified: Secondary | ICD-10-CM | POA: Insufficient documentation

## 2023-12-01 DIAGNOSIS — R8271 Bacteriuria: Secondary | ICD-10-CM

## 2023-12-01 DIAGNOSIS — E039 Hypothyroidism, unspecified: Secondary | ICD-10-CM | POA: Diagnosis not present

## 2023-12-01 DIAGNOSIS — I110 Hypertensive heart disease with heart failure: Secondary | ICD-10-CM | POA: Diagnosis not present

## 2023-12-01 DIAGNOSIS — E119 Type 2 diabetes mellitus without complications: Secondary | ICD-10-CM | POA: Diagnosis not present

## 2023-12-01 DIAGNOSIS — Z85028 Personal history of other malignant neoplasm of stomach: Secondary | ICD-10-CM | POA: Diagnosis not present

## 2023-12-01 DIAGNOSIS — D509 Iron deficiency anemia, unspecified: Secondary | ICD-10-CM

## 2023-12-01 DIAGNOSIS — I2510 Atherosclerotic heart disease of native coronary artery without angina pectoris: Secondary | ICD-10-CM | POA: Diagnosis not present

## 2023-12-01 DIAGNOSIS — Z853 Personal history of malignant neoplasm of breast: Secondary | ICD-10-CM | POA: Diagnosis not present

## 2023-12-01 DIAGNOSIS — M1711 Unilateral primary osteoarthritis, right knee: Principal | ICD-10-CM | POA: Insufficient documentation

## 2023-12-01 DIAGNOSIS — Z471 Aftercare following joint replacement surgery: Secondary | ICD-10-CM | POA: Diagnosis not present

## 2023-12-01 DIAGNOSIS — Z86718 Personal history of other venous thrombosis and embolism: Secondary | ICD-10-CM | POA: Diagnosis not present

## 2023-12-01 DIAGNOSIS — Z96651 Presence of right artificial knee joint: Secondary | ICD-10-CM

## 2023-12-01 DIAGNOSIS — D649 Anemia, unspecified: Secondary | ICD-10-CM

## 2023-12-01 DIAGNOSIS — I480 Paroxysmal atrial fibrillation: Secondary | ICD-10-CM | POA: Insufficient documentation

## 2023-12-01 DIAGNOSIS — Z91040 Latex allergy status: Secondary | ICD-10-CM | POA: Insufficient documentation

## 2023-12-01 DIAGNOSIS — I5023 Acute on chronic systolic (congestive) heart failure: Secondary | ICD-10-CM | POA: Insufficient documentation

## 2023-12-01 DIAGNOSIS — R8290 Unspecified abnormal findings in urine: Secondary | ICD-10-CM

## 2023-12-01 DIAGNOSIS — Z01812 Encounter for preprocedural laboratory examination: Secondary | ICD-10-CM

## 2023-12-01 HISTORY — DX: Left bundle-branch block, unspecified: I44.7

## 2023-12-01 HISTORY — PX: KNEE ARTHROPLASTY: SHX992

## 2023-12-01 LAB — GLUCOSE, CAPILLARY
Glucose-Capillary: 140 mg/dL — ABNORMAL HIGH (ref 70–99)
Glucose-Capillary: 163 mg/dL — ABNORMAL HIGH (ref 70–99)
Glucose-Capillary: 179 mg/dL — ABNORMAL HIGH (ref 70–99)
Glucose-Capillary: 233 mg/dL — ABNORMAL HIGH (ref 70–99)

## 2023-12-01 SURGERY — ARTHROPLASTY, KNEE, TOTAL, USING IMAGELESS COMPUTER-ASSISTED NAVIGATION
Anesthesia: Spinal | Site: Knee | Laterality: Right

## 2023-12-01 MED ORDER — TRANEXAMIC ACID-NACL 1000-0.7 MG/100ML-% IV SOLN
INTRAVENOUS | Status: AC
  Filled 2023-12-01: qty 100

## 2023-12-01 MED ORDER — METOCLOPRAMIDE HCL 5 MG PO TABS
10.0000 mg | ORAL_TABLET | Freq: Three times a day (TID) | ORAL | Status: DC
  Administered 2023-12-01 – 2023-12-02 (×5): 10 mg via ORAL
  Filled 2023-12-01 (×5): qty 2

## 2023-12-01 MED ORDER — MUPIROCIN 2 % EX OINT: 1.0000 | TOPICAL_OINTMENT | Freq: Two times a day (BID) | CUTANEOUS | 0 refills | Status: DC

## 2023-12-01 MED ORDER — INSULIN ASPART 100 UNIT/ML IJ SOLN
0.0000 [IU] | Freq: Three times a day (TID) | INTRAMUSCULAR | Status: DC
  Administered 2023-12-01: 3 [IU] via SUBCUTANEOUS
  Administered 2023-12-02: 2 [IU] via SUBCUTANEOUS
  Filled 2023-12-01 (×2): qty 1

## 2023-12-01 MED ORDER — BISACODYL 10 MG RE SUPP: 10.0000 mg | Freq: Every day | RECTAL | Status: DC | PRN

## 2023-12-01 MED ORDER — CHLORHEXIDINE GLUCONATE 4 % EX SOLN: 1.0000 | CUTANEOUS | 1 refills | Status: DC

## 2023-12-01 MED ORDER — EPHEDRINE 5 MG/ML INJ
INTRAVENOUS | Status: AC
  Filled 2023-12-01: qty 5

## 2023-12-01 MED ORDER — DIPHENHYDRAMINE HCL 12.5 MG/5ML PO ELIX: 12.5000 mg | ORAL_SOLUTION | ORAL | Status: DC | PRN

## 2023-12-01 MED ORDER — ACETAMINOPHEN 10 MG/ML IV SOLN: 1000.0000 mg | Freq: Once | INTRAVENOUS | Status: DC | PRN

## 2023-12-01 MED ORDER — LIDOCAINE HCL (PF) 2 % IJ SOLN
INTRAMUSCULAR | Status: AC
  Filled 2023-12-01: qty 5

## 2023-12-01 MED ORDER — MIDAZOLAM HCL 2 MG/2ML IJ SOLN
INTRAMUSCULAR | Status: AC
  Filled 2023-12-01: qty 2

## 2023-12-01 MED ORDER — OXYCODONE HCL 5 MG PO TABS: 5.0000 mg | ORAL_TABLET | Freq: Once | ORAL | Status: DC | PRN

## 2023-12-01 MED ORDER — PHENYLEPHRINE HCL-NACL 20-0.9 MG/250ML-% IV SOLN
INTRAVENOUS | Status: DC | PRN
  Administered 2023-12-01: 30 ug/min via INTRAVENOUS

## 2023-12-01 MED ORDER — CEFAZOLIN SODIUM-DEXTROSE 2-4 GM/100ML-% IV SOLN
2.0000 g | INTRAVENOUS | Status: AC
  Administered 2023-12-01: 2 g via INTRAVENOUS

## 2023-12-01 MED ORDER — CHLORHEXIDINE GLUCONATE 0.12 % MT SOLN
OROMUCOSAL | Status: AC
  Filled 2023-12-01: qty 15

## 2023-12-01 MED ORDER — PANTOPRAZOLE SODIUM 40 MG PO TBEC
40.0000 mg | DELAYED_RELEASE_TABLET | Freq: Two times a day (BID) | ORAL | Status: DC
  Administered 2023-12-01 – 2023-12-02 (×2): 40 mg via ORAL
  Filled 2023-12-01 (×2): qty 1

## 2023-12-01 MED ORDER — SODIUM CHLORIDE 0.9 % IR SOLN
Status: DC | PRN
  Administered 2023-12-01: 3000 mL

## 2023-12-01 MED ORDER — DEXAMETHASONE SODIUM PHOSPHATE 10 MG/ML IJ SOLN
8.0000 mg | Freq: Once | INTRAMUSCULAR | Status: AC
  Administered 2023-12-01: 8 mg via INTRAVENOUS

## 2023-12-01 MED ORDER — PHENYLEPHRINE HCL-NACL 20-0.9 MG/250ML-% IV SOLN
INTRAVENOUS | Status: AC
  Filled 2023-12-01: qty 250

## 2023-12-01 MED ORDER — GLUCERNA SHAKE PO LIQD
237.0000 mL | Freq: Three times a day (TID) | ORAL | Status: DC
  Administered 2023-12-01 – 2023-12-02 (×2): 237 mL via ORAL

## 2023-12-01 MED ORDER — SODIUM CHLORIDE (PF) 0.9 % IJ SOLN
INTRAMUSCULAR | Status: DC | PRN
  Administered 2023-12-01: 120 mL via INTRAMUSCULAR

## 2023-12-01 MED ORDER — LEVOTHYROXINE SODIUM 50 MCG PO TABS
175.0000 ug | ORAL_TABLET | Freq: Every day | ORAL | Status: DC
  Administered 2023-12-02: 175 ug via ORAL
  Filled 2023-12-01: qty 1

## 2023-12-01 MED ORDER — SODIUM CHLORIDE 0.9% FLUSH: 3.0000 mL | INTRAVENOUS | Status: DC | PRN

## 2023-12-01 MED ORDER — OXYCODONE HCL 5 MG PO TABS: 5.0000 mg | ORAL_TABLET | ORAL | Status: DC | PRN

## 2023-12-01 MED ORDER — METHADONE HCL 5 MG PO TABS: 7.5000 mg | ORAL_TABLET | Freq: Two times a day (BID) | ORAL | Status: DC

## 2023-12-01 MED ORDER — TORSEMIDE 20 MG PO TABS: 20.0000 mg | ORAL_TABLET | Freq: Two times a day (BID) | ORAL | Status: DC

## 2023-12-01 MED ORDER — EPHEDRINE SULFATE-NACL 50-0.9 MG/10ML-% IV SOSY
PREFILLED_SYRINGE | INTRAVENOUS | Status: DC | PRN
  Administered 2023-12-01: 5 mg via INTRAVENOUS
  Administered 2023-12-01 (×2): 10 mg via INTRAVENOUS

## 2023-12-01 MED ORDER — SODIUM CHLORIDE 0.9 % IV SOLN: INTRAVENOUS | Status: DC | PRN

## 2023-12-01 MED ORDER — PROPOFOL 500 MG/50ML IV EMUL
INTRAVENOUS | Status: DC | PRN
  Administered 2023-12-01: 50 ug/kg/min via INTRAVENOUS

## 2023-12-01 MED ORDER — ACETAMINOPHEN 10 MG/ML IV SOLN
1000.0000 mg | Freq: Four times a day (QID) | INTRAVENOUS | Status: DC
  Administered 2023-12-01 – 2023-12-02 (×2): 1000 mg via INTRAVENOUS
  Filled 2023-12-01 (×2): qty 100

## 2023-12-01 MED ORDER — BUPIVACAINE LIPOSOME 1.3 % IJ SUSP
INTRAMUSCULAR | Status: AC
  Filled 2023-12-01: qty 20

## 2023-12-01 MED ORDER — FENTANYL CITRATE (PF) 100 MCG/2ML IJ SOLN: 25.0000 ug | INTRAMUSCULAR | Status: DC | PRN

## 2023-12-01 MED ORDER — ESCITALOPRAM OXALATE 10 MG PO TABS
10.0000 mg | ORAL_TABLET | Freq: Every day | ORAL | Status: DC
  Administered 2023-12-02: 10 mg via ORAL
  Filled 2023-12-01 (×2): qty 1

## 2023-12-01 MED ORDER — CHLORHEXIDINE GLUCONATE 4 % EX SOLN: 60.0000 mL | Freq: Once | CUTANEOUS | Status: DC

## 2023-12-01 MED ORDER — INSULIN ASPART 100 UNIT/ML IJ SOLN
0.0000 [IU] | Freq: Every day | INTRAMUSCULAR | Status: DC
  Administered 2023-12-01: 2 [IU] via SUBCUTANEOUS
  Filled 2023-12-01: qty 1

## 2023-12-01 MED ORDER — SODIUM CHLORIDE 0.9 % IV SOLN: INTRAVENOUS | Status: DC

## 2023-12-01 MED ORDER — METOCLOPRAMIDE HCL 5 MG/ML IJ SOLN
INTRAMUSCULAR | Status: AC
  Filled 2023-12-01: qty 2

## 2023-12-01 MED ORDER — BUPIVACAINE HCL (PF) 0.5 % IJ SOLN
INTRAMUSCULAR | Status: AC
  Filled 2023-12-01: qty 10

## 2023-12-01 MED ORDER — PROPOFOL 1000 MG/100ML IV EMUL
INTRAVENOUS | Status: AC
  Filled 2023-12-01: qty 100

## 2023-12-01 MED ORDER — METOPROLOL SUCCINATE ER 50 MG PO TB24
50.0000 mg | ORAL_TABLET | Freq: Two times a day (BID) | ORAL | Status: DC
  Administered 2023-12-01 – 2023-12-02 (×2): 50 mg via ORAL
  Filled 2023-12-01 (×2): qty 1

## 2023-12-01 MED ORDER — HYDROMORPHONE HCL 1 MG/ML IJ SOLN: 0.5000 mg | INTRAMUSCULAR | Status: DC | PRN

## 2023-12-01 MED ORDER — SODIUM CHLORIDE 0.9% FLUSH
3.0000 mL | Freq: Two times a day (BID) | INTRAVENOUS | Status: DC
Start: 2023-12-01 — End: 2023-12-01

## 2023-12-01 MED ORDER — BUPIVACAINE HCL (PF) 0.5 % IJ SOLN
INTRAMUSCULAR | Status: DC | PRN
  Administered 2023-12-01: 2.7 mL

## 2023-12-01 MED ORDER — METHADONE HCL 10 MG PO TABS
10.0000 mg | ORAL_TABLET | Freq: Every day | ORAL | Status: DC
  Administered 2023-12-01: 10 mg via ORAL
  Filled 2023-12-01: qty 1

## 2023-12-01 MED ORDER — ACETAMINOPHEN 325 MG PO TABS
325.0000 mg | ORAL_TABLET | Freq: Four times a day (QID) | ORAL | Status: DC | PRN
  Administered 2023-12-02 (×2): 650 mg via ORAL
  Filled 2023-12-01 (×2): qty 2

## 2023-12-01 MED ORDER — ONDANSETRON HCL 4 MG/2ML IJ SOLN: 4.0000 mg | Freq: Four times a day (QID) | INTRAMUSCULAR | Status: DC | PRN

## 2023-12-01 MED ORDER — CEFAZOLIN SODIUM-DEXTROSE 2-4 GM/100ML-% IV SOLN
2.0000 g | Freq: Four times a day (QID) | INTRAVENOUS | Status: AC
  Administered 2023-12-01 (×2): 2 g via INTRAVENOUS
  Filled 2023-12-01 (×2): qty 100

## 2023-12-01 MED ORDER — CHLORHEXIDINE GLUCONATE 0.12 % MT SOLN
15.0000 mL | Freq: Once | OROMUCOSAL | Status: AC
  Administered 2023-12-01: 15 mL via OROMUCOSAL

## 2023-12-01 MED ORDER — SODIUM CHLORIDE 0.9 % IV SOLN
25.0000 mg | Freq: Once | INTRAVENOUS | Status: AC
  Administered 2023-12-01: 25 mg via INTRAVENOUS
  Filled 2023-12-01: qty 1

## 2023-12-01 MED ORDER — DIPHENHYDRAMINE HCL 50 MG/ML IJ SOLN
INTRAMUSCULAR | Status: DC | PRN
  Administered 2023-12-01: 12.5 mg via INTRAVENOUS

## 2023-12-01 MED ORDER — ALUM & MAG HYDROXIDE-SIMETH 200-200-20 MG/5ML PO SUSP: 30.0000 mL | ORAL | Status: DC | PRN

## 2023-12-01 MED ORDER — ONDANSETRON HCL 4 MG PO TABS: 4.0000 mg | ORAL_TABLET | Freq: Four times a day (QID) | ORAL | Status: DC | PRN

## 2023-12-01 MED ORDER — CELECOXIB 200 MG PO CAPS
400.0000 mg | ORAL_CAPSULE | Freq: Once | ORAL | Status: AC
  Administered 2023-12-01: 400 mg via ORAL

## 2023-12-01 MED ORDER — ACETAMINOPHEN 10 MG/ML IV SOLN
INTRAVENOUS | Status: AC
  Filled 2023-12-01: qty 100

## 2023-12-01 MED ORDER — POTASSIUM CHLORIDE CRYS ER 20 MEQ PO TBCR
20.0000 meq | EXTENDED_RELEASE_TABLET | Freq: Two times a day (BID) | ORAL | Status: DC
  Administered 2023-12-01 – 2023-12-02 (×2): 20 meq via ORAL
  Filled 2023-12-01 (×2): qty 1

## 2023-12-01 MED ORDER — SODIUM CHLORIDE (PF) 0.9 % IJ SOLN
INTRAMUSCULAR | Status: AC
  Filled 2023-12-01: qty 50

## 2023-12-01 MED ORDER — METHADONE HCL 5 MG PO TABS
7.5000 mg | ORAL_TABLET | Freq: Two times a day (BID) | ORAL | Status: DC
  Administered 2023-12-01 – 2023-12-02 (×3): 7.5 mg via ORAL
  Filled 2023-12-01 (×4): qty 2

## 2023-12-01 MED ORDER — GABAPENTIN 300 MG PO CAPS
ORAL_CAPSULE | ORAL | Status: AC
  Filled 2023-12-01: qty 1

## 2023-12-01 MED ORDER — DEXAMETHASONE SODIUM PHOSPHATE 10 MG/ML IJ SOLN
INTRAMUSCULAR | Status: AC
  Filled 2023-12-01: qty 1

## 2023-12-01 MED ORDER — MIDAZOLAM HCL 5 MG/5ML IJ SOLN
INTRAMUSCULAR | Status: DC | PRN
  Administered 2023-12-01: 2 mg via INTRAVENOUS

## 2023-12-01 MED ORDER — ASPIRIN 81 MG PO CHEW
81.0000 mg | CHEWABLE_TABLET | Freq: Two times a day (BID) | ORAL | Status: DC
  Administered 2023-12-01 – 2023-12-02 (×2): 81 mg via ORAL
  Filled 2023-12-01 (×2): qty 1

## 2023-12-01 MED ORDER — CEFAZOLIN SODIUM-DEXTROSE 2-4 GM/100ML-% IV SOLN
INTRAVENOUS | Status: AC
  Filled 2023-12-01: qty 100

## 2023-12-01 MED ORDER — LOSARTAN POTASSIUM 25 MG PO TABS
12.5000 mg | ORAL_TABLET | Freq: Two times a day (BID) | ORAL | Status: DC
  Administered 2023-12-02: 12.5 mg via ORAL
  Filled 2023-12-01: qty 1

## 2023-12-01 MED ORDER — PHENOL 1.4 % MT LIQD: 1.0000 | OROMUCOSAL | Status: DC | PRN

## 2023-12-01 MED ORDER — OXYCODONE HCL 5 MG PO TABS
10.0000 mg | ORAL_TABLET | ORAL | Status: DC | PRN
  Administered 2023-12-01 – 2023-12-02 (×4): 10 mg via ORAL
  Filled 2023-12-01 (×4): qty 2

## 2023-12-01 MED ORDER — PHENYLEPHRINE 80 MCG/ML (10ML) SYRINGE FOR IV PUSH (FOR BLOOD PRESSURE SUPPORT)
PREFILLED_SYRINGE | INTRAVENOUS | Status: DC | PRN
  Administered 2023-12-01 (×16): 80 ug via INTRAVENOUS

## 2023-12-01 MED ORDER — LACOSAMIDE 50 MG PO TABS
200.0000 mg | ORAL_TABLET | Freq: Two times a day (BID) | ORAL | Status: DC
  Administered 2023-12-01 – 2023-12-02 (×2): 200 mg via ORAL
  Filled 2023-12-01 (×3): qty 4

## 2023-12-01 MED ORDER — MENTHOL 3 MG MT LOZG: 1.0000 | LOZENGE | OROMUCOSAL | Status: DC | PRN

## 2023-12-01 MED ORDER — SENNOSIDES-DOCUSATE SODIUM 8.6-50 MG PO TABS
1.0000 | ORAL_TABLET | Freq: Two times a day (BID) | ORAL | Status: DC
  Administered 2023-12-01: 1 via ORAL
  Filled 2023-12-01 (×2): qty 1

## 2023-12-01 MED ORDER — METOCLOPRAMIDE HCL 5 MG/ML IJ SOLN
INTRAMUSCULAR | Status: DC | PRN
  Administered 2023-12-01: 10 mg via INTRAVENOUS

## 2023-12-01 MED ORDER — TRANEXAMIC ACID-NACL 1000-0.7 MG/100ML-% IV SOLN
1000.0000 mg | INTRAVENOUS | Status: AC
  Administered 2023-12-01: 1000 mg via INTRAVENOUS

## 2023-12-01 MED ORDER — CELECOXIB 200 MG PO CAPS
ORAL_CAPSULE | ORAL | Status: AC
  Filled 2023-12-01: qty 2

## 2023-12-01 MED ORDER — SURGIRINSE WOUND IRRIGATION SYSTEM - OPTIME
TOPICAL | Status: DC | PRN
  Administered 2023-12-01: 450 mL via TOPICAL

## 2023-12-01 MED ORDER — BUPIVACAINE HCL (PF) 0.25 % IJ SOLN
INTRAMUSCULAR | Status: AC
  Filled 2023-12-01: qty 60

## 2023-12-01 MED ORDER — TRANEXAMIC ACID-NACL 1000-0.7 MG/100ML-% IV SOLN
1000.0000 mg | Freq: Once | INTRAVENOUS | Status: AC
  Administered 2023-12-01: 1000 mg via INTRAVENOUS

## 2023-12-01 MED ORDER — DIPHENHYDRAMINE HCL 50 MG/ML IJ SOLN
INTRAMUSCULAR | Status: AC
  Filled 2023-12-01: qty 1

## 2023-12-01 MED ORDER — FENTANYL CITRATE (PF) 100 MCG/2ML IJ SOLN
INTRAMUSCULAR | Status: AC
  Filled 2023-12-01: qty 2

## 2023-12-01 MED ORDER — LORAZEPAM 1 MG PO TABS
1.0000 mg | ORAL_TABLET | Freq: Four times a day (QID) | ORAL | Status: DC | PRN
Start: 2023-12-01 — End: 2023-12-02

## 2023-12-01 MED ORDER — OXYCODONE HCL 5 MG/5ML PO SOLN: 5.0000 mg | Freq: Once | ORAL | Status: DC | PRN

## 2023-12-01 MED ORDER — GABAPENTIN 300 MG PO CAPS
300.0000 mg | ORAL_CAPSULE | Freq: Once | ORAL | Status: AC
  Administered 2023-12-01: 300 mg via ORAL

## 2023-12-01 MED ORDER — GABAPENTIN 300 MG PO CAPS
300.0000 mg | ORAL_CAPSULE | Freq: Three times a day (TID) | ORAL | Status: DC
  Administered 2023-12-01 – 2023-12-02 (×4): 300 mg via ORAL
  Filled 2023-12-01 (×4): qty 1

## 2023-12-01 MED ORDER — ORAL CARE MOUTH RINSE: 15.0000 mL | Freq: Once | OROMUCOSAL | Status: AC

## 2023-12-01 MED ORDER — FLEET ENEMA RE ENEM: 1.0000 | ENEMA | Freq: Once | RECTAL | Status: DC | PRN

## 2023-12-01 MED ORDER — METOPROLOL TARTRATE 50 MG PO TABS: 50.0000 mg | ORAL_TABLET | Freq: Two times a day (BID) | ORAL | Status: DC

## 2023-12-01 MED ORDER — CELECOXIB 200 MG PO CAPS
200.0000 mg | ORAL_CAPSULE | Freq: Two times a day (BID) | ORAL | Status: DC
  Administered 2023-12-01 – 2023-12-02 (×2): 200 mg via ORAL
  Filled 2023-12-01 (×2): qty 1

## 2023-12-01 MED ORDER — MAGNESIUM HYDROXIDE 400 MG/5ML PO SUSP
30.0000 mL | Freq: Every day | ORAL | Status: DC
  Filled 2023-12-01: qty 30

## 2023-12-01 MED ORDER — TORSEMIDE 20 MG PO TABS
40.0000 mg | ORAL_TABLET | Freq: Two times a day (BID) | ORAL | Status: DC
  Administered 2023-12-02: 40 mg via ORAL
  Filled 2023-12-01 (×2): qty 2

## 2023-12-01 MED ORDER — ACETAMINOPHEN 10 MG/ML IV SOLN
INTRAVENOUS | Status: DC | PRN
  Administered 2023-12-01: 1000 mg via INTRAVENOUS

## 2023-12-01 MED ORDER — ROPINIROLE HCL 1 MG PO TABS
4.0000 mg | ORAL_TABLET | Freq: Every day | ORAL | Status: DC
  Administered 2023-12-01: 4 mg via ORAL
  Filled 2023-12-01: qty 4

## 2023-12-01 SURGICAL SUPPLY — 66 items
ATTUNE PSFEM RTSZ4 NARCEM KNEE (Femur) IMPLANT
ATTUNE PSRP INSR SZ4 7 KNEE (Insert) IMPLANT
BASEPLATE TIBIAL ROTATING SZ 4 (Knees) IMPLANT
BATTERY INSTRU NAVIGATION (MISCELLANEOUS) ×4 IMPLANT
BIT DRILL QUICK REL 1/8 2PK SL (BIT) ×1 IMPLANT
BLADE CLIPPER SURG (BLADE) IMPLANT
BLADE SAW 70X12.5 (BLADE) ×1 IMPLANT
BLADE SAW 90X13X1.19 OSCILLAT (BLADE) ×1 IMPLANT
BLADE SAW 90X25X1.19 OSCILLAT (BLADE) ×1 IMPLANT
BONE CEMENT GENTAMICIN (Cement) ×2 IMPLANT
BRUSH SCRUB EZ PLAIN DRY (MISCELLANEOUS) ×1 IMPLANT
CEMENT BONE GENTAMICIN 40 (Cement) IMPLANT
COOLER POLAR GLACIER W/PUMP (MISCELLANEOUS) ×1 IMPLANT
CUFF TRNQT CYL 24X4X16.5-23 (TOURNIQUET CUFF) IMPLANT
CUFF TRNQT CYL 30X4X21-28X (TOURNIQUET CUFF) IMPLANT
DRAPE SHEET LG 3/4 BI-LAMINATE (DRAPES) ×1 IMPLANT
DRSG AQUACEL AG ADV 3.5X14 (GAUZE/BANDAGES/DRESSINGS) ×1 IMPLANT
DRSG MEPILEX SACRM 8.7X9.8 (GAUZE/BANDAGES/DRESSINGS) ×1 IMPLANT
DRSG TEGADERM 4X4.75 (GAUZE/BANDAGES/DRESSINGS) ×1 IMPLANT
DURAPREP 26ML APPLICATOR (WOUND CARE) ×2 IMPLANT
ELECT CAUTERY BLADE 6.4 (BLADE) ×1 IMPLANT
ELECT REM PT RETURN 9FT ADLT (ELECTROSURGICAL) ×1 IMPLANT
ELECTRODE REM PT RTRN 9FT ADLT (ELECTROSURGICAL) ×1 IMPLANT
EVACUATOR 1/8 PVC DRAIN (DRAIN) ×1 IMPLANT
EX-PIN ORTHOLOCK NAV 4X150 (PIN) ×2 IMPLANT
GAUZE XEROFORM 1X8 LF (GAUZE/BANDAGES/DRESSINGS) ×1 IMPLANT
GLOVE BIOGEL M STRL SZ7.5 (GLOVE) ×6 IMPLANT
GLOVE SURG UNDER POLY LF SZ8 (GLOVE) ×2 IMPLANT
GOWN STRL REUS W/ TWL LRG LVL3 (GOWN DISPOSABLE) ×1 IMPLANT
GOWN STRL REUS W/ TWL XL LVL3 (GOWN DISPOSABLE) ×1 IMPLANT
GOWN TOGA ZIPPER T7+ PEEL AWAY (MISCELLANEOUS) ×1 IMPLANT
HOLDER FOLEY CATH W/STRAP (MISCELLANEOUS) ×1 IMPLANT
HOOD PEEL AWAY T7 (MISCELLANEOUS) ×1 IMPLANT
KIT TURNOVER KIT A (KITS) ×1 IMPLANT
KNIFE SCULPS 14X20 (INSTRUMENTS) ×1 IMPLANT
MANIFOLD NEPTUNE II (INSTRUMENTS) ×2 IMPLANT
NDL SPNL 20GX3.5 QUINCKE YW (NEEDLE) ×2 IMPLANT
NEEDLE SPNL 20GX3.5 QUINCKE YW (NEEDLE) ×2 IMPLANT
PACK TOTAL KNEE (MISCELLANEOUS) ×1 IMPLANT
PAD ABD DERMACEA PRESS 5X9 (GAUZE/BANDAGES/DRESSINGS) ×2 IMPLANT
PAD ARMBOARD POSITIONER FOAM (MISCELLANEOUS) ×3 IMPLANT
PAD WRAPON POLAR KNEE (MISCELLANEOUS) ×1 IMPLANT
PATELLA MEDIAL ATTUN 35MM KNEE (Knees) IMPLANT
PENCIL SMOKE EVACUATOR COATED (MISCELLANEOUS) ×1 IMPLANT
PIN DRILL FIX HALF THREAD (BIT) ×2 IMPLANT
PIN FIXATION 1/8DIA X 3INL (PIN) ×1 IMPLANT
PULSAVAC PLUS IRRIG FAN TIP (DISPOSABLE) ×1 IMPLANT
SOL .9 NS 3000ML IRR UROMATIC (IV SOLUTION) ×1 IMPLANT
SOLUTION IRRIG SURGIPHOR (IV SOLUTION) ×1 IMPLANT
SPONGE DRAIN TRACH 4X4 STRL 2S (GAUZE/BANDAGES/DRESSINGS) ×1 IMPLANT
STAPLER SKIN PROX 35W (STAPLE) ×1 IMPLANT
STOCKINETTE IMPERV 14X48 (MISCELLANEOUS) ×1 IMPLANT
STOCKINETTE STRL BIAS CUT 8X4 (MISCELLANEOUS) ×1 IMPLANT
STRAP TIBIA SHORT (MISCELLANEOUS) ×1 IMPLANT
SUCTION TUBE FRAZIER 10FR DISP (SUCTIONS) ×1 IMPLANT
SUT VIC AB 0 CT1 36 (SUTURE) ×1 IMPLANT
SUT VIC AB 1 CT1 36 (SUTURE) ×2 IMPLANT
SUT VIC AB 2-0 CT2 27 (SUTURE) ×1 IMPLANT
SYR 30ML LL (SYRINGE) ×2 IMPLANT
TIP FAN IRRIG PULSAVAC PLUS (DISPOSABLE) ×1 IMPLANT
TOWEL OR 17X26 4PK STRL BLUE (TOWEL DISPOSABLE) ×1 IMPLANT
TOWER CARTRIDGE SMART MIX (DISPOSABLE) ×1 IMPLANT
TRAP FLUID SMOKE EVACUATOR (MISCELLANEOUS) ×1 IMPLANT
TRAY FOLEY MTR SLVR 16FR STAT (SET/KITS/TRAYS/PACK) ×1 IMPLANT
WATER STERILE IRR 1000ML POUR (IV SOLUTION) ×1 IMPLANT
WRAPON POLAR PAD KNEE (MISCELLANEOUS) ×1 IMPLANT

## 2023-12-01 NOTE — Plan of Care (Signed)

## 2023-12-01 NOTE — Op Note (Signed)
 OPERATIVE NOTE  DATE OF SURGERY:  12/01/2023  PATIENT NAME:  Brittany Nguyen   DOB: December 08, 1965  MRN: [id number]  PRE-OPERATIVE DIAGNOSIS: Degenerative arthrosis of the right knee, primary  POST-OPERATIVE DIAGNOSIS:  Same  PROCEDURE:  Right total knee arthroplasty using computer-assisted navigation  SURGEON:  Jena Gauss. M.D.  ANESTHESIA: spinal  ESTIMATED BLOOD LOSS: 50 mL  FLUIDS REPLACED: 900 mL of crystalloid  TOURNIQUET TIME: 86 minutes  DRAINS: 2 medium Hemovac drains  SOFT TISSUE RELEASES: Anterior cruciate ligament, posterior cruciate ligament, deep medial collateral ligament, patellofemoral ligament  IMPLANTS UTILIZED: DePuy Attune size 4N posterior stabilized femoral component (cemented), size 4 rotating platform tibial component (cemented), 35 mm medialized dome patella (cemented), and a 7 mm stabilized rotating platform polyethylene insert.  INDICATIONS FOR SURGERY: Brittany Nguyen is a 58 y.o. year old female with a long history of progressive knee pain. X-rays demonstrated severe degenerative changes in tricompartmental fashion. The patient had not seen any significant improvement despite conservative nonsurgical intervention. After discussion of the risks and benefits of surgical intervention, the patient expressed understanding of the risks benefits and agree with plans for total knee arthroplasty.   The risks, benefits, and alternatives were discussed at length including but not limited to the risks of infection, bleeding, nerve injury, stiffness, blood clots, the need for revision surgery, cardiopulmonary complications, among others, and they were willing to proceed.  PROCEDURE IN DETAIL: The patient was brought into the operating room and, after adequate spinal anesthesia was achieved, a tourniquet was placed on the patient's upper thigh. The patient's knee and leg were cleaned and prepped with alcohol and DuraPrep and draped in the usual sterile  fashion. A "timeout" was performed as per usual protocol. The lower extremity was exsanguinated using an Esmarch, and the tourniquet was inflated to 300 mmHg. An anterior longitudinal incision was made followed by a standard mid vastus approach. The deep fibers of the medial collateral ligament were elevated in a subperiosteal fashion off of the medial flare of the tibia so as to maintain a continuous soft tissue sleeve. The patella was subluxed laterally and the patellofemoral ligament was incised. Inspection of the knee demonstrated severe degenerative changes with full-thickness loss of articular cartilage. Osteophytes were debrided using a rongeur. Anterior and posterior cruciate ligaments were excised. Two 4.0 mm Schanz pins were inserted in the femur and into the tibia for attachment of the array of trackers used for computer-assisted navigation. Hip center was identified using a circumduction technique. Distal landmarks were mapped using the computer. The distal femur and proximal tibia were mapped using the computer. The distal femoral cutting guide was positioned using computer-assisted navigation so as to achieve a 5 distal valgus cut. The femur was sized and it was felt that a size 4N femoral component was appropriate. A size 4 femoral cutting guide was positioned and the anterior cut was performed and verified using the computer. This was followed by completion of the posterior and chamfer cuts. Femoral cutting guide for the central box was then positioned in the center box cut was performed.  Attention was then directed to the proximal tibia. Medial and lateral menisci were excised. The extramedullary tibial cutting guide was positioned using computer-assisted navigation so as to achieve a 0 varus-valgus alignment and 3 posterior slope. The cut was performed and verified using the computer. The proximal tibia was sized and it was felt that a size 4 tibial tray was appropriate. Tibial and femoral  trials were inserted followed  by insertion of a 7 mm polyethylene insert. This allowed for excellent mediolateral soft tissue balancing both in flexion and in full extension. Finally, the patella was cut and prepared so as to accommodate a 35 mm medialized dome patella. A patella trial was placed and the knee was placed through a range of motion with excellent patellar tracking appreciated. The femoral trial was removed after debridement of posterior osteophytes. The central post-hole for the tibial component was reamed followed by insertion of a keel punch. Tibial trials were then removed. Cut surfaces of bone were irrigated with copious amounts of normal saline using pulsatile lavage and then suctioned dry. Polymethylmethacrylate cement with gentamicin was prepared in the usual fashion using a vacuum mixer. Cement was applied to the cut surface of the proximal tibia as well as along the undersurface of a size 4 rotating platform tibial component. Tibial component was positioned and impacted into place. Excess cement was removed using Personal assistant. Cement was then applied to the cut surfaces of the femur as well as along the posterior flanges of the size 4N femoral component. The femoral component was positioned and impacted into place. Excess cement was removed using Personal assistant. A 7 mm polyethylene trial was inserted and the knee was brought into full extension with steady axial compression applied. Finally, cement was applied to the backside of a 35 mm medialized dome patella and the patellar component was positioned and patellar clamp applied. Excess cement was removed using Personal assistant. After adequate curing of the cement, the tourniquet was deflated after a total tourniquet time of 86 minutes. Hemostasis was achieved using electrocautery. The knee was irrigated with copious amounts of normal saline using pulsatile lavage followed by 450 ml of Surgiphor and then suctioned dry. 20 mL of 1.3% Exparel  and 60 mL of 0.25% Marcaine in 40 mL of normal saline was injected along the posterior capsule, medial and lateral gutters, and along the arthrotomy site. A 7 mm stabilized rotating platform polyethylene insert was inserted and the knee was placed through a range of motion with excellent mediolateral soft tissue balancing appreciated and excellent patellar tracking noted. 2 medium drains were placed in the wound bed and brought out through separate stab incisions. The medial parapatellar portion of the incision was reapproximated using interrupted sutures of #1 Vicryl. Subcutaneous tissue was approximated in layers using first #0 Vicryl followed #2-0 Vicryl. The skin was approximated with skin staples. A sterile dressing was applied.  The patient tolerated the procedure well and was transported to the recovery room in stable condition.    Brittany Nguyen., M.D.

## 2023-12-01 NOTE — H&P (Signed)
 ORTHOPAEDIC HISTORY & PHYSICAL Brittany Nguyen, Adelina Mings., MD - 11/24/2023 10:30 AM EDT Formatting of this note is different from the original. Images from the original note were not included. Chief Complaint: Chief Complaint Patient presents with Right knee degenerative arthrosis H&P right total knee arthroplasty 12/01/23  Reason for Visit: The patient is a 58 y.o. female who presents today for reevaluation of her right knee. She has a long history of right knee pain. She localizes most of the pain along the medial aspect of the right knee. She reports some swelling, no locking, and some giving way of the right knee. The pain is aggravated by any weight bearing. The right knee pain limits the patient's ability to ambulate long distances. The patient has not appreciated any significant improvement despite Tylenol, NSAIDs, intraarticular corticosteroid injections, narcotic analgesics, and activity modification. She is not using any ambulatory aids. The patient states that the right knee pain has progressed to the point that it is significantly interfering with her activities of daily living.  She was originally scheduled for right total knee arthroplasty on December 23 but surgery was cancelled pending cardiac clearance.  Of note, the IVC filter was apparently removed on 03/07/2023.  Medications: Current Outpatient Medications Medication Sig Dispense Refill albuterol 90 mcg/actuation inhaler Inhale 2 inhalations into the lungs every 6 (six) hours as needed for Wheezing or Shortness of Breath beclomethasone dipropionate (QVAR REDIHALER HFA) 80 mcg/actuation inhaler Inhale 1 inhalation into the lungs as needed cyclobenzaprine (FLEXERIL) 5 MG tablet Take 1 tablet (5 mg total) by mouth 3 (three) times daily as needed for Muscle spasms 60 tablet 1 escitalopram oxalate (LEXAPRO) 10 MG tablet TAKE ONE TABLET BY MOUTH DAILY AT 9AM 30 tablet 11 FUROsemide (LASIX) 40 MG tablet Take 80 mg by mouth 2 (two)  times daily gabapentin (NEURONTIN) 300 MG capsule TAKE ONE CAPSULE (300 MG TOTAL) BY MOUTH TWICE DAILY @9AM -5PM 180 capsule 11 hydrOXYzine HCL (ATARAX) 25 MG tablet Take 1 tablet (25 mg total) by mouth 3 (three) times daily as needed for Anxiety 90 tablet 6 lacosamide (VIMPAT) 100 mg tablet Take 2 tablets (200 mg total) by mouth every 12 (twelve) hours 120 tablet 4 levothyroxine (SYNTHROID) 175 MCG tablet TAKE ONE TABLET BY MOUTH DAILY AT 8AM 30 tablet 11 LORazepam (ATIVAN) 1 MG tablet Take 1 tablet (1 mg total) by mouth 2 (two) times daily as needed for Anxiety 60 tablet 1 losartan (COZAAR) 25 MG tablet Take 12.5 mg by mouth 2 (two) times daily magnesium oxide (MAG-OX) 400 mg (241.3 mg magnesium) tablet Take 400 mg by mouth once daily melatonin 3 mg Cap Take 1 capsule by mouth at bedtime methadone (DOLOPHINE) 5 MG tablet 1.5 tabs in am, 1.5 tabs at midday, 2 tabs at bedtime 52 tablet 0 metoprolol succinate (TOPROL-XL) 50 MG XL tablet Take 50 mg by mouth 2 (two) times daily nitroGLYcerin (NITROSTAT) 0.4 MG SL tablet Place 0.4 mg under the tongue every 5 (five) minutes as needed for Chest pain May take up to 3 doses. oxyCODONE-acetaminophen (PERCOCET) 10-325 mg tablet Take 1 tablet by mouth every 4 (four) hours as needed for Pain for up to 30 days 180 tablet 0 potassium chloride (KLOR-CON) 20 MEQ ER tablet Take 20 mEq by mouth 2 (two) times daily rOPINIRole (REQUIP) 4 MG tablet TAKE ONE TABLET BY MOUTH DAILY AT 9PM EVERY NIGHT 90 tablet 11 spironolactone (ALDACTONE) 50 MG tablet Take by mouth TORsemide (DEMADEX) 20 MG tablet Take 40 mg by mouth 2 (two) times daily  ubrogepant 100 mg Tab Take 100 mg by mouth as needed For migraines 10 tablet 0 naloxone (NARCAN) 4 mg/actuation nasal spray Place 1 spray (4 mg total) into one nostril once as needed for up to 1 dose For somnolence, decrease respiratory rate due opioid use. 1 each 0 promethazine (PHENERGAN) 25 MG tablet Take 1 tablet (25 mg total) by  mouth every 6 (six) hours as needed for Nausea 120 tablet 1  No current facility-administered medications for this visit.  Allergies: Allergies Allergen Reactions Iodinated Contrast Media Anaphylaxis Iron Sucrose Anaphylaxis Penicillins Anaphylaxis and Hives Immediate rash, facial/tongue/throat swelling, SOB, lightheadedness, hypotension, tachicardia IgE = 4 (WNL) on 08/12/2022 Latex, Natural Rubber Hives Lidocaine Hives Sacubitril-Valsartan Other (See Comments) Seizure- not sure if caused by new start to entresto vs hypoglycemia Budesonide Itching Hydromorphone Rash Imdur [Isosorbide Mononitrate] Headache Ondansetron Headache Severe headache Povidone-Iodine Rash Tizanidine Other (See Comments) "Altered mental feeling"  Past Medical History: Past Medical History: Diagnosis Date A-fib (CMS/HHS-HCC) 11/26/2022 Abdominal wall pain in right flank 04/10/2018 Acute myocardial infarction of anterolateral wall, initial episode of care (CMS/HHS-HCC) 08/2016 Acute on chronic systolic congestive heart failure (CMS/HHS-HCC) 08/05/2016 Acute respiratory failure (CMS/HHS-HCC) 09/24/2017 Anemia Anemia IRON DEFICIENCY- PATIENT HAS IRON INFUSIONS WEEKLY-[facility] IN [place] Anesthesia complication chest pain with ED visit after 08/2018 EGD thought to be r/t procedure or CO2 use Arthritis Asthma without status asthmaticus (HHS-HCC) seasonal Automatic implantable cardioverter-defibrillator in situ 04/07/2017 Breast cancer (CMS/HHS-HCC) right C. difficile colitis 01/14/2018 CAD (coronary artery disease) cardiologist Laverle Patter PA -Dr Arvin Collard [facility] in [place]-Last seen 1 month ago -no changes Change in bowel habits 04/10/2018 Chest pain 01/11/2013 Added automatically from request for surgery [id number] CHF (congestive heart failure) (CMS/HHS-HCC) EF 25% Chronic diarrhea of unknown origin 06/21/2018 Community acquired pneumonia 08/27/2016 Cough, persistent  11/22/2015 Depression Diverticulitis of colon 01/20/2009 Overview: Qualifier: Diagnosis of By: Everardo All MD, Cleophas Dunker Qualifier: Diagnosis of By: Everardo All MD, Sean A DVT (deep venous thrombosis) (CMS/HHS-HCC) x3 Encounter for anticoagulation discussion and counseling 08/08/2018 Esophageal reflux 01/20/2009 Overview: Qualifier: Diagnosis of By: Everardo All MD, Sean A Essential hypertension 07/31/2022 GERD (gastroesophageal reflux disease) GIB (gastrointestinal bleeding) 01/08/2018 H/O syncope 09/18/2014 Heart palpitations 10/17/2014 History of anesthesia reaction slow to wake up History of blood transfusion History of DVT (deep vein thrombosis) 08/08/2018 Hx MRSA infection 06/21/2018 Hypoglycemia, unspecified 01/20/2009 Overview: Qualifier: Diagnosis of By: Everardo All MD, Sean A Hypokalemia 11/22/2015 Hypotension 02/03/2017 Influenza A 10/29/2017 Intractable migraine with aura without status migrainosus 06/20/2018 Iron deficiency anemia 03/27/2018 Leukocytosis 11/22/2015 Lump or mass in breast 08/08/2018 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CMS/HHS-HCC) Migraine headache Mixed hyperlipidemia 04/17/2014 MRSA (methicillin resistant Staphylococcus aureus) not sure was MRSA NICM (nonischemic cardiomyopathy) (CMS/HHS-HCC) 03/27/2017 Added automatically from request for surgery [id number] Nontoxic multinodular goiter 01/20/2009 Overview: Qualifier: Diagnosis of By: Everardo All MD, Sean A Obesity OSA on CPAP 04/17/2014 Pacemaker Pain in right knee 05/15/2018 PONV (postoperative nausea and vomiting) Relieved with pre-meds Poor intravenous access Postcholecystectomy diarrhea 04/10/2018 Presence of automatic implantable cardioverter-defibrillator PACEMAKER/ICD BY [employer] Reactive airway disease that is not asthma 08/27/2016 Restless legs syndrome 08/27/2016 Seizure (CMS/HHS-HCC) 12/25/2020 Seizures (CMS/HHS-HCC) Sepsis (CMS/HHS-HCC) 07/03/2017 Sleep apnea CPAP with a mask uses  everynight SOB (shortness of breath) 11/01/2017 Sore throat, unspecified 06/30/2023 Syncope and collapse 09/18/2014 Tremor Type 2 diabetes mellitus without complication (CMS/HHS-HCC) 01/23/2020 Unstable angina (CMS/HHS-HCC) 08/08/2018 Weight gain  Past Surgical History: Past Surgical History: Procedure Laterality Date CHOLECYSTECTOMY 2005 COLON SURGERY 2010 sigmoid resection CARDIAC SURGERY 03/2017 pacemaker/defibrillator - [employer] at [facility] in [place] COLONOSCOPY 02/22/2018 Negative  colon biopsy/Diverticulosis/Repeat 90yrs/TKT EGD 02/22/2018 Duodenitis/Intestinal metaplasia/Gastritis/neuroendocrine cell hyperplasia/Repeat 52yrs/TKT THYROIDECTOMY TOTAL 03/2018 [facility] ESOPHAGOGASTRODOUDENOSCOPY W/BIOPSY N/A 03/09/2018 Procedure: ESOPHAGOGASTRODUODENOSCOPY, FLEXIBLE, TRANSORAL; WITH BIOPSY, SINGLE OR MULTIPLE; Surgeon: Kristeen Mans, MD; Location: [facility] ENDO/BRONCH; Service: Gastroenterology; Laterality: N/A; ESOPHAGOGASTRODOUDENOSCOPY W/BIOPSY N/A 08/24/2018 Procedure: ESOPHAGOGASTRODUODENOSCOPY, FLEXIBLE, TRANSORAL; WITH BIOPSY, SINGLE OR MULTIPLE; Surgeon: Kristeen Mans, MD; Location: [facility] ENDO/BRON[facility]; Service: Gastroenterology; Laterality: N/A; ESOPHAGOGASTRODOUDENOSCOPY W/BIOPSY N/A 02/25/2019 Procedure: EGD/EUS; Surgeon: Kristeen Mans, MD; Location: [facility] ENDO/BRONCH; Service: Gastroenterology; Laterality: N/A; ESOPHAGOGASTRODOUDENOSCOPY W/INJECTION N/A 02/25/2019 Procedure: ESOPHAGOGASTRODUODENOSCOPY, FLEXIBLE, TRANSORAL; WITH DIRECTED SUBMUCOSAL INJECTION(S), ANY SUBSTANCE; Surgeon: Kristeen Mans, MD; Location: [facility] ENDO/BRONCH; Service: Gastroenterology; Laterality: N/A; SURVEILLANCE COLONOSCOPY N/A 11/01/2019 Procedure: COLONOSCOPY/ UPPER EUS; Surgeon: Kristeen Mans, MD; Location: [facility] OR; Service: Gastroenterology; Laterality: N/A; ESOPHAGOGASTRODOUDENOSCOPY W/ULTRASOUND EXAMINATION N/A 11/01/2019 Procedure: UPPER EUS;  ESOPHAGOGASTRODUODENOSCOPY, FLEXIBLE, TRANSORAL; W/ ENDOSCOPIC ULTRASOUND EXAM, ESOPHAGUS, STOMACH, DUODENUM OR SURGICALLY ALTERED STOMACH WHERE JEJUNUM IS EXAMINED DISTAL TO ANASTOMOSIS; Surgeon: Kristeen Mans, MD; Location: [facility] OR; Service: Gastroenterology; Laterality: N/A; COLONOSCOPY W/BIOPSY N/A 12/13/2019 Procedure: COLONOSCOPY, FLEXIBLE; WITH BIOPSY, SINGLE OR MULTIPLE; Surgeon: Kristeen Mans, MD; Location: [facility] ENDO/BRONCH; Service: Gastroenterology; Laterality: N/A; ESOPHAGOGASTRODOUDENOSCOPY W/BIOPSY N/A 12/13/2019 Procedure: ESOPHAGOGASTRODUODENOSCOPY, FLEXIBLE, TRANSORAL; WITH BIOPSY, SINGLE OR MULTIPLE; Surgeon: Kristeen Mans, MD; Location: [facility] ENDO/BRONCH; Service: Gastroenterology; Laterality: N/A; ESOPHAGOGASTRODOUDENOSCOPY W/BIOPSY N/A 11/29/2021 Procedure: UPPER EUS; Surgeon: Kristeen Mans, MD; Location: [facility] ENDO/BRONCH; Service: Gastroenterology; Laterality: N/A; COLONOSCOPY W/BIOPSY N/A 05/25/2022 Procedure: COLONOSCOPY, FLEXIBLE; WITH BIOPSY, SINGLE OR MULTIPLE; Surgeon: Huel Cote, MD; Location: [facility] ENDO/BRONCH; Service: Gastroenterology; Laterality: N/A; Left total knee arthroplasty using computer-assisted navigation 08/24/2022 Dr Ernest Pine CESAREAN SECTION COLONOSCOPY HYSTERECTOMY INSERT / REPLACE / REMOVE PACEMAKER  Social History: Social History  Socioeconomic History Marital status: Married Spouse name: Chrissie Noa Number of children: 2 Years of education: 20 Highest education level: Master's degree (e.g., MA, MS, MEng, MEd, MSW, MBA) Occupational History Occupation: Disabled- Presenter, broadcasting Tobacco Use Smoking status: Never Smokeless tobacco: Never Vaping Use Vaping status: Never Used Substance and Sexual Activity Alcohol use: No Drug use: Never Sexual activity: Defer Partners: Male  Social Drivers of Health  Financial Resource Strain: High Risk (11/24/2023) Overall Financial Resource Strain  (CARDIA) Difficulty of Paying Living Expenses: Hard Food Insecurity: Food Insecurity Present (11/24/2023) Hunger Vital Sign Worried About Running Out of Food in the Last Year: Sometimes true Ran Out of Food in the Last Year: Sometimes true Transportation Needs: No Transportation Needs (11/24/2023) PRAPARE - Contractor (Medical): No Lack of Transportation (Non-Medical): No Physical Activity: Insufficiently Active (12/07/2020) Exercise Vital Sign Days of Exercise per Week: 2 days Minutes of Exercise per Session: 20 min Stress: Stress Concern Present (12/07/2020) [employer] of Occupational Health - Occupational Stress Questionnaire Feeling of Stress : Rather much Social Connections: Socially Integrated (09/22/2023) Received from [employer] Social Connection and Isolation Panel [NHANES] Frequency of Communication with Friends and Family: More than three times a week Frequency of Social Gatherings with Friends and Family: More than three times a week Attends Religious Services: More than 4 times per year Active Member of [employer] or Organizations: Yes Attends Engineer, structural: More than 4 times per year Marital Status: Married Housing Stability: Low Risk (11/24/2023) Housing Stability Vital Sign Unable to Pay for Housing in the Last Year: No Number of Times Moved in the Last Year: 1 Homeless in the Last Year: No  Family History: Family History Problem Relation Name Age of Onset Anesthesia problems Mother cardiac arrest during heart catheterization Myocardial Infarction (Heart attack) Mother  Diabetes type II Mother Diabetes Mother Anesthesia problems Father slow to wake postop, awareness under anesthesia Myocardial Infarction (Heart attack) Father Diabetes type II Father Colon cancer Father Prostate cancer Father Asthma Father Brain cancer Father Diabetes Father Migraines Father Diabetes type II Sister Diabetes Sister Liver disease  Sister Diabetes type II Sister Diabetes type II Sister Cancer Other grandparents Other Other siblings bleeding problems Coronary Artery Disease (Blocked arteries around heart) Other Ulcers Other Malignant hypertension Neg Hx Malignant hyperthermia Neg Hx Pseudochol deficiency Neg Hx PONV Neg Hx  Review of Systems: A comprehensive 14 point ROS was performed, reviewed, and the pertinent orthopaedic findings are documented in the HPI.  Exam BP 114/74  Ht 165.1 cm (5\' 5" )  Wt 97.3 kg (214 lb 9.6 oz)  LMP (LMP Unknown) Comment: Hysterectomy  BMI 35.71 kg/m  General: Well-developed, well-nourished female seen in no acute distress. Antalgic gait. Varus thrust to the right knee.  HEENT: Atraumatic, normocephalic. Pupils are equal and reactive to light. Extraocular motion is intact. Sclera are clear. Oropharynx is clear with moist mucosa.  Neck: Supple, nontender, and with good ROM. No thyromegaly, adenopathy, JVD, or carotid bruits.  Lungs: Clear to auscultation bilaterally.  Cardiovascular: Regular rate and rhythm. Normal S1, S2. No murmur . No appreciable gallops or rubs. Peripheral pulses are palpable. No lower extremity edema. Homan`s test is negative.  Abdomen: Soft, nontender, nondistended. Bowel sounds are present.  Extremities: Good strength, stability, and range of motion of the upper extremities. Good range of motion of the hips and ankles.  Right Knee: Soft tissue swelling: mild Effusion: none Erythema: none Crepitance: mild Tenderness: medial Alignment: relative varus Mediolateral laxity: medial pseudolaxity Posterior sag: negative Patellar tracking: Good tracking without evidence of subluxation or tilt Atrophy: VMO and vastus medialis atrophy. Quadriceps tone was fair to good. Range of motion: 0/6/94 degrees  Neurologic: Awake, alert, and oriented. Sensory function is intact to pinprick and light touch. Motor strength is judged to be 5/5. Motor  coordination is within normal limits. No apparent clonus. No tremor.  Radiographs: I ordered and interpreted standing AP, lateral, and sunrise radiographs of the right knee that were obtained in the office today. There is significant narrowing of the medial cartilage space with bone-on-bone articulation and associated varus alignment. Osteophyte formation is noted. Subchondral sclerosis is noted. No evidence of fracture or dislocation.  Impression: Degenerative arthrosis of the right knee  Plan: The findings were discussed in detail with the patient. Conservative treatment options were reviewed with the patient. We discussed the risks and benefits of surgical intervention. The usual perioperative course was also discussed in detail. The patient expressed understanding of the risks and benefits of surgical intervention and would like to proceed with plans for right total knee arthroplasty.  The patient has a history of DVT and possible pulmonary embolism. The patient previously stated the IVC filter (placed before her left total knee arthroplasty) was still in place, but a review of the records demonstrated that the filter had been removed on 03/07/2023. I would like to refer her back to Vascular Surgery for consideration of an IVC filter preoperatively.  We will need to coordinate her postoperative pain management with [facility].  I spent a total of 45 minutes in both face-to-face and non-face-to-face activities, excluding procedures performed, for this visit on the date of this encounter.  MEDICAL CLEARANCE: Per anesthesiology. ACTIVITY: As tolerated. WORK STATUS: Not applicable. THERAPY: Preoperative physical therapy evaluation. MEDICATIONS: Requested Prescriptions  No prescriptions requested or ordered  in this encounter  FOLLOW-UP: Return for postoperative follow-up.  Justus Droke P. Angie Fava., M.D.  This note was generated in part with voice recognition software and I  apologize for any typographical errors that were not detected and corrected.  Electronically signed by Shari Heritage., MD at 11/26/2023 7:40 AM EDT

## 2023-12-01 NOTE — Progress Notes (Signed)
 Approximately 1400--Pt arrived to room 155A from PACU. Upon arrival, VS obtained and assessment completed. Pt oriented to room and unit. Fall precautions in place. Cold therapy in place on RLE.

## 2023-12-01 NOTE — Transfer of Care (Signed)
 Immediate Anesthesia Transfer of Care Note  Patient: Brittany Nguyen  Procedure(s) Performed: COMPUTER ASSISTED TOTAL KNEE ARTHROPLASTY (Right: Knee)  Patient Location: PACU  Anesthesia Type:Spinal  Level of Consciousness: awake  Airway & Oxygen Therapy: Patient Spontanous Breathing  Post-op Assessment: Report given to RN and phenylephrine gtt continues, will wean in PACU  Post vital signs: Reviewed and stable  Last Vitals:  Vitals Value Taken Time  BP 119/58 12/01/23 1133  Temp 35.9 C 12/01/23 1133  Pulse 60 12/01/23 1133  Resp 10 12/01/23 1133  SpO2 100 % 12/01/23 1133    Last Pain:  Vitals:   12/01/23 1133  TempSrc: Temporal  PainSc:          Complications: No notable events documented.

## 2023-12-01 NOTE — Evaluation (Signed)
 Physical Therapy Evaluation Patient Details Name: Brittany Nguyen MRN: [id number] DOB: [birth year] Today's Date: 12/01/2023  History of Present Illness  Pt is a 58 yo F diagnosed with degenerative arthrosis of the right knee and is s/p elective TKA. PMH includes HTN, CAD, MI, CHF with EF 25%, A-fib, COPD, seizures, anxitey, depression, AICD, L TKA, and DM.  Clinical Impression  Pt was pleasant and motivated to participate during the session and put forth good effort throughout. Pt required some cuing and extra time and effort with below functional tasks but required no physical assistance during the session.  Of note, pt's BP taken in supine by nursing at onset of session at 89/62.  Pt's seated BP was 98/69 without symptoms and then 85/59 in standing with mild dizziness/lightheadedness.  Pt able to take 2-3 small side steps to get closer to the [facility] and then returned to sitting with additional amb deferred due to above.  Pt's SpO2 remained WNL on room air during the session.  Pt is expected to make good progress towards goals once BP stabilizes and will benefit from continued PT services upon discharge to safely address deficits listed in patient problem list for decreased caregiver assistance and eventual return to PLOF.          If plan is discharge home, recommend the following: A little help with walking and/or transfers;A little help with bathing/dressing/bathroom;Assistance with cooking/housework;Help with stairs or ramp for entrance;Assist for transportation   Can travel by private vehicle        Equipment Recommendations Rolling walker (2 wheels);BSC/3in1  Recommendations for Other Services       Functional Status Assessment Patient has had a recent decline in their functional status and demonstrates the ability to make significant improvements in function in a reasonable and predictable amount of time.     Precautions / Restrictions Precautions Precautions: Fall Recall of  Precautions/Restrictions: Intact Restrictions Weight Bearing Restrictions Per Provider Order: Yes RLE Weight Bearing Per Provider Order: Weight bearing as tolerated Other Position/Activity Restrictions: Pt able to perform Ind RLE SLR without extensor lag, no KI required      Mobility  Bed Mobility Overal bed mobility: Modified Independent             General bed mobility comments: Min extra time, effort, and use of the bed rail only    Transfers Overall transfer level: Needs assistance Equipment used: Rolling walker (2 wheels) Transfers: Sit to/from Stand Sit to Stand: Contact guard assist           General transfer comment: Mod verbal and visual cues for sequencing for hand and R foot placement; good control and stability throughout    Ambulation/Gait Ambulation/Gait assistance: Contact guard assist Gait Distance (Feet): 2 Feet Assistive device: Rolling walker (2 wheels) Gait Pattern/deviations: Step-to pattern, Decreased step length - right, Decreased step length - left Gait velocity: decreased     General Gait Details: Pt able to take several small side-steps at the EOB with further distances deferred secondary to mild dizziness in standing and soft BP of 85/59.  Stairs            Wheelchair Mobility     Tilt Bed    Modified Rankin (Stroke Patients Only)       Balance Overall balance assessment: Needs assistance   Sitting balance-Leahy Scale: Normal     Standing balance support: Bilateral upper extremity supported, During functional activity Standing balance-Leahy Scale: Good  Pertinent Vitals/Pain Pain Assessment Pain Assessment: 0-10 Pain Score: 2  Pain Location: R knee Pain Descriptors / Indicators: Sore Pain Intervention(s): Repositioned, Premedicated before session, Monitored during session, Ice applied    Home Living Family/patient expects to be discharged to:: Private residence Living  Arrangements: Spouse/significant other Available Help at Discharge: Family;Available 24 hours/day Type of Home: Mobile home Home Access: Stairs to enter Entrance Stairs-Rails: Can reach both;Right;Left Entrance Stairs-Number of Steps: 8   Home Layout: One level Home Equipment: Grab bars - tub/shower;Shower seat - built in;Cane - single point      Prior Function Prior Level of Function : Independent/Modified Independent;Driving             Mobility Comments: Ind amb community distances without an AD, no fall history ADLs Comments: Ind with ADLs     Extremity/Trunk Assessment   Upper Extremity Assessment Upper Extremity Assessment: Overall WFL for tasks assessed    Lower Extremity Assessment Lower Extremity Assessment: RLE deficits/detail RLE Deficits / Details: BLE ankle AROM, strength, and sensation to light touch grossly WNL, R hip flex strength >/= 3/5 with pt able to perform Ind SLR RLE: Unable to fully assess due to pain RLE Sensation: WNL RLE Coordination: WNL       Communication   Communication Communication: No apparent difficulties    Cognition Arousal: Alert Behavior During Therapy: WFL for tasks assessed/performed   PT - Cognitive impairments: No apparent impairments                         Following commands: Intact       Cueing Cueing Techniques: Verbal cues, Visual cues, Tactile cues     General Comments      Exercises Total Joint Exercises Ankle Circles/Pumps: AROM, Strengthening, Both, 10 reps Quad Sets: AROM, Strengthening, Right, 10 reps Straight Leg Raises: AROM, Strengthening, 5 reps, Both Long Arc Quad: AROM, Strengthening, Right, 5 reps, 10 reps Knee Flexion: AROM, Strengthening, Right, 5 reps, 10 reps Goniometric ROM: R knee AROM: 2-48 deg, limited by bandaging per patient Marching in Standing: AROM, Strengthening, Both, 5 reps, Standing Other Exercises Other Exercises: HEP education per handout with emphasis on RLE QS  and seated knee flex   Assessment/Plan    PT Assessment Patient needs continued PT services  PT Problem List Decreased strength;Decreased range of motion;Decreased activity tolerance;Decreased balance;Decreased mobility;Decreased knowledge of use of DME;Pain       PT Treatment Interventions DME instruction;Gait training;Stair training;Functional mobility training;Therapeutic activities;Therapeutic exercise;Balance training;Patient/family education    PT Goals (Current goals can be found in the Care Plan section)  Acute Rehab PT Goals Patient Stated Goal: To walk better and to be able to play with my grandchildren PT Goal Formulation: With patient Time For Goal Achievement: 12/14/23 Potential to Achieve Goals: Good    Frequency BID     Co-evaluation               AM-PAC PT "6 Clicks" Mobility  Outcome Measure Help needed turning from your back to your side while in a flat bed without using bedrails?: A Little Help needed moving from lying on your back to sitting on the side of a flat bed without using bedrails?: A Little Help needed moving to and from a bed to a chair (including a wheelchair)?: A Little Help needed standing up from a chair using your arms (e.g., wheelchair or bedside chair)?: A Little Help needed to walk in hospital room?: A Lot Help needed  climbing 3-5 steps with a railing? : A Lot 6 Click Score: 16    End of Session Equipment Utilized During Treatment: Gait belt Activity Tolerance: Other (comment) (limited by mild dizziness and soft BP in standing) Patient left: in bed;with call bell/phone within reach;with bed alarm set;with SCD's reapplied;Other (comment) (Polar care donned to R knee) Nurse Communication: Mobility status;Weight bearing status;Other (comment) (BPs in sitting and standing with symptoms per above) PT Visit Diagnosis: Other abnormalities of gait and mobility (R26.89);Muscle weakness (generalized) (M62.81);Pain Pain - Right/Left:  Right Pain - part of body: Knee    Time: 0272-5366 PT Time Calculation (min) (ACUTE ONLY): 41 min   Charges:   PT Evaluation $PT Eval Moderate Complexity: 1 Mod PT Treatments $Therapeutic Activity: 8-22 mins PT General Charges $$ ACUTE PT VISIT: 1 Visit    D. Scott Veatrice Eckstein PT, DPT 12/01/23, 4:02 PM

## 2023-12-01 NOTE — Interval H&P Note (Signed)
 History and Physical Interval Note:  12/01/2023 6:11 AM  Brittany Nguyen  has presented today for surgery, with the diagnosis of PRIMARY OSTEOARTHRITIS OF RIGHT KNEE..  The various methods of treatment have been discussed with the patient and family. After consideration of risks, benefits and other options for treatment, the patient has consented to  Procedure(s): COMPUTER ASSISTED TOTAL KNEE ARTHROPLASTY (Right) as a surgical intervention.  The patient's history has been reviewed, patient examined, no change in status, stable for surgery.  I have reviewed the patient's chart and labs.  Questions were answered to the patient's satisfaction.    NOTE: IVC filter removal had been attempted in July 20204 but could not be completed due to endolelialization of hook. Confirmed with Dr. Gilda Crease.   Courtland Coppa P Addylin Manke

## 2023-12-01 NOTE — Anesthesia Procedure Notes (Addendum)
 Spinal  Patient location during procedure: OR Start time: 12/01/2023 7:34 AM End time: 12/01/2023 7:36 AM Reason for block: surgical anesthesia Staffing Performed: resident/CRNA  Resident/CRNA: Otho Perl, CRNA Performed by: Otho Perl, CRNA Authorized by: Foye Deer, MD   Preanesthetic Checklist Completed: patient identified, IV checked, site marked, risks and benefits discussed, surgical consent, monitors and equipment checked, pre-op evaluation and timeout performed Spinal Block Patient position: sitting Prep: ChloraPrep Patient monitoring: heart rate, cardiac monitor, continuous pulse ox and blood pressure Approach: midline Location: L3-4 Injection technique: single-shot Needle Needle type: Quincke  Needle gauge: 22 G Needle length: 9 cm Needle insertion depth: 7.5 cm Assessment Sensory level: T4 Events: CSF return Additional Notes One pass, pt tol well. Used 1ml Bupivicaine for skin wheal.  ZOX#[id number] Exp. 6/26

## 2023-12-01 NOTE — OR Nursing (Signed)
 Duraprep removed from patient skin by Dr. Ernest Pine with duraprep remover. Perineum washed with saline to remove iodine after removal of foley by Mat Carne RN.

## 2023-12-01 NOTE — Progress Notes (Signed)
 Patient is not able to walk the distance required to go the bathroom, or he/she is unable to safely negotiate stairs required to access the bathroom.  A 3in1 BSC will alleviate this problem   Amenda Duclos P. Angie Fava M.D.

## 2023-12-01 NOTE — OR Nursing (Signed)
 Latex, Ioban, Exparel/Bupivacaine, and iodine used for procedure. Dr Ernest Pine aware of patient allergies.

## 2023-12-02 DIAGNOSIS — Z79899 Other long term (current) drug therapy: Secondary | ICD-10-CM | POA: Diagnosis not present

## 2023-12-02 DIAGNOSIS — M1711 Unilateral primary osteoarthritis, right knee: Secondary | ICD-10-CM | POA: Diagnosis not present

## 2023-12-02 DIAGNOSIS — Z86718 Personal history of other venous thrombosis and embolism: Secondary | ICD-10-CM | POA: Diagnosis not present

## 2023-12-02 DIAGNOSIS — I110 Hypertensive heart disease with heart failure: Secondary | ICD-10-CM | POA: Diagnosis not present

## 2023-12-02 DIAGNOSIS — I2510 Atherosclerotic heart disease of native coronary artery without angina pectoris: Secondary | ICD-10-CM | POA: Diagnosis not present

## 2023-12-02 DIAGNOSIS — Z853 Personal history of malignant neoplasm of breast: Secondary | ICD-10-CM | POA: Diagnosis not present

## 2023-12-02 DIAGNOSIS — E039 Hypothyroidism, unspecified: Secondary | ICD-10-CM | POA: Diagnosis not present

## 2023-12-02 DIAGNOSIS — I480 Paroxysmal atrial fibrillation: Secondary | ICD-10-CM | POA: Diagnosis not present

## 2023-12-02 DIAGNOSIS — J449 Chronic obstructive pulmonary disease, unspecified: Secondary | ICD-10-CM | POA: Diagnosis not present

## 2023-12-02 LAB — GLUCOSE, CAPILLARY
Glucose-Capillary: 119 mg/dL — ABNORMAL HIGH (ref 70–99)
Glucose-Capillary: 145 mg/dL — ABNORMAL HIGH (ref 70–99)

## 2023-12-02 MED ORDER — CELECOXIB 200 MG PO CAPS: 200.0000 mg | ORAL_CAPSULE | Freq: Two times a day (BID) | ORAL | 0 refills | Status: DC

## 2023-12-02 MED ORDER — MUPIROCIN 2 % EX OINT: 1.0000 | TOPICAL_OINTMENT | Freq: Two times a day (BID) | CUTANEOUS | 0 refills | Status: DC

## 2023-12-02 MED ORDER — CHLORHEXIDINE GLUCONATE 4 % EX SOLN
1.0000 | CUTANEOUS | 1 refills | Status: DC
Start: 2023-12-02 — End: 2023-12-27

## 2023-12-02 MED ORDER — ASPIRIN 81 MG PO CHEW: 81.0000 mg | CHEWABLE_TABLET | Freq: Two times a day (BID) | ORAL | 0 refills | Status: DC

## 2023-12-02 NOTE — Discharge Summary (Cosign Needed Addendum)
 Physician Discharge Summary  Patient ID: Brittany Nguyen MRN: [id number] DOB/AGE: January 03, 1966 58 y.o.  Admit date: 12/01/2023 Discharge date: 12/02/2023  Admission Diagnoses:  Primary osteoarthritis of right knee [M17.11] History of total knee arthroplasty, right [Z96.651]   Discharge Diagnoses: Patient Active Problem List   Diagnosis Date Noted   History of total knee arthroplasty, right 12/01/2023   Seizure-like activity (HCC) 10/06/2023   Recurrent seizures (HCC) 09/23/2023   COPD exacerbation (HCC) 09/21/2023   Encephalopathy 11/30/2022   A-fib (HCC) 11/26/2022   Emotional stress 10/18/2022   HTN (hypertension) 09/19/2022   CAD (coronary artery disease) 09/19/2022   Depression with anxiety 09/19/2022   Nonproductive cough 09/11/2022   Hypothyroidism 09/11/2022   Status post total left knee replacement 08/24/2022   CHF (congestive heart failure) (HCC) 08/17/2022   History of DVT (deep vein thrombosis) 08/17/2022   MRSA (methicillin resistant Staphylococcus aureus) 08/17/2022   Hypocalcemia 07/31/2022   Diarrhea 07/31/2022   Essential hypertension 07/31/2022   Morbid obesity with body mass index (BMI) of 50.0 to 59.9 in adult (HCC) 07/31/2022   Seizures (HCC) 07/30/2022   Breast cancer (HCC) 05/23/2022   Bloody stool 04/22/2022   Seizure (HCC) 03/01/2022   Refractory nausea and vomiting 12/15/2021   Migraine 11/09/2021   Neuropathy 11/09/2021   Anxiety 11/09/2021   At risk for prolonged QT interval syndrome - borderline long QT on EKG 11/07/21 in ED 11/07/2021   QT prolongation 09/27/2021   Cancer related pain 08/10/2021   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   Seizure disorder (HCC) 06/22/2021   Chronic diarrhea 06/22/2021   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   Moderate protein-calorie malnutrition (HCC) 06/19/2020   Non-ischemic cardiomyopathy (HCC) 01/23/2020   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/23/2020   Breakthrough  seizure (HCC) 01/23/2020   Acquired hypothyroidism 01/23/2020   Chronic use of opiate for therapeutic purpose 01/23/2020   Type 2 diabetes mellitus without complication (HCC) 01/23/2020   Current moderate episode of major depressive disorder (HCC) 05/21/2019   Viral syndrome 04/24/2019   DVT (deep venous thrombosis) (HCC) 02/27/2019   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   Coronary artery disease involving native coronary artery of native heart 08/24/2018   Mild intermittent asthma without complication 08/24/2018   Breast mass 08/08/2018   Intractable migraine with aura without status migrainosus 06/20/2018   Primary osteoarthritis of right knee 06/01/2018   Pain in right knee 05/15/2018   Abdominal wall pain in right flank 04/10/2018   Postcholecystectomy diarrhea 04/10/2018   Iron deficiency anemia 03/27/2018   Normocytic anemia 03/26/2018   Unstable angina (HCC)    Encounter for anticoagulation discussion and counseling    Influenza A 10/29/2017   COPD (chronic obstructive pulmonary disease) (HCC) 04/26/2017   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 02/03/2017   Hypotension 02/03/2017   RLS (restless legs syndrome) 12/09/2016   Hypokalemia 11/22/2015   OSA on CPAP 04/17/2014   Hx MRSA infection    Lump or mass in breast    Asthma 2013   GOITER, MULTINODULAR 01/20/2009   HYPOGLYCEMIA, UNSPECIFIED 01/20/2009   GERD 01/20/2009   DIVERTICULITIS OF COLON 01/20/2009   Diverticulitis of colon 01/20/2009    Past Medical History:  Diagnosis Date   Acute anterolateral wall MI (HCC) 08/2016   Acute respiratory failure (HCC)    AICD (automatic cardioverter/defibrillator) present 03/29/2017   a.) Medtronic Evera MRI XT DR SureScan (SN: [id number])  Anxiety    a.) on BZO PRN (lorazepam)   Arthritis    Asthma 2013   Breast cancer, right (HCC)    C. difficile colitis  01/14/2018   Carcinoid tumor determined by biopsy of stomach 02/22/2018   a.) Bx 02/22/2018 --> NE tumor cells --> AE1/AE3, chromogranin, synaptophysin (+); Ki-67 proliferation rate <1%; b.) repeat Bx 08/24/2018 --> well differentiard NET (G1)   CHF (congestive heart failure) (HCC)    Chronic, continuous use of opioids    a.) has naloxone Rx available   COPD (chronic obstructive pulmonary disease) (HCC)    Coronary artery disease    Depression    Diet-controlled type 2 diabetes mellitus (HCC)    Diverticulitis 2010   DVT (deep venous thrombosis) (HCC)    Endometriosis 1990   Generalized epilepsy (HCC)    a.) on lacosamide; b.) seizure 07-30-22 in setting of hypokalemia (K+ 2.6); c.) last seizure 11/13/2023 during blood draw   GERD (gastroesophageal reflux disease)    GIB (gastrointestinal bleeding) 01/08/2018   H/O syncope    HFrEF (heart failure with reduced ejection fraction) (HCC)    a.)TTE 12/09/16: EF 25-30%, dif HK; b.) TTE 02/08/17: EF 25%, dif HK, mild TR, G1DD; c.) TTE 04/25/17: EF 30-35%, mild LVH; d.) TTE 01/30/18: EF 45%, MAC, LAE, mild MR, G1DD; e.) TTE 11/06/2018: EF 25%, sev glob HK, triv MR/TR/PR; f.) TTE 05/12/19: EF 35%, MAC, AoV sclerosis, G1DD; g.) TTE 11/05/19: EF 20%, sev glob HK, triv MR; h.) TTE 06/17/20: EF 40%, mod glob HK, triv MR; I:) TTE 06/14/22: EF 25-30%, G1DD   History of cardiac catheterization    a.) LHC 10/21/2014: normal coronaries; b.) R/LHC 02/24/2017: normal coronaries, mRA 12, mPA 28, mPCWP 15, CO 8.0, CI 3.8   History of kidney stones    Hx MRSA infection    Hypertension    Hypokalemia    Hypothyroidism    a.) s/p thyroidectomy   Incomplete left bundle branch block (LBBB)    Iron deficiency anemia 03/27/2018   Lump or mass in breast 11/27/2012   a.) Bx 11/27/2012 --> apocrine wall cyst   Migraine    Nausea & vomiting    Neuropathy    Non-ischemic cardiomyopathy (HCC)    a.) TTE 12/09/2016: EF 25-30%, b.) TTE 02/08/2017: EF 25%; c.) Medtronic AICD  placed 03/29/2017; d.) TTE 04/25/2017: EF 30-35%; e.) TTE 01/30/2018: EF 45%; f.) TTE 11/06/2018: EF 25%; g.) TTE 05/12/2019: EF 35%; h.) TTE 11/05/2019: EF 20%; I.) TTE 06/17/2020: EF 40%; J.) TTE 06/14/2022: EF 25-30%   Obesity    OSA on CPAP    PAF (paroxysmal atrial fibrillation) (HCC)    Pneumonia    PONV (postoperative nausea and vomiting)    Post-COVID chronic cough    Postcholecystectomy diarrhea    Prolonged Q-T interval on ECG    RAD (reactive airway disease)    Restless leg    a.) on ropinirole   Sepsis (HCC)    Unstable angina (HCC)      Transfusion: None.   Consultants (if any):   Discharged Condition: Improved  Hospital Course: Brittany Nguyen is an 58 y.o. female who was admitted 12/01/2023 with a diagnosis of primary degenerative arthritis of the right knee and went to the operating room on 12/01/2023 and underwent the above named procedures.    Surgeries: Procedure(s): COMPUTER ASSISTED TOTAL KNEE ARTHROPLASTY on 12/01/2023 Patient tolerated the surgery well. Taken to PACU where she was stabilized and then transferred to the orthopedic floor.  Started on Aspirin 81mg  BID. Heels elevated on bed with rolled towels. No evidence of DVT. Negative Homan. Physical therapy started on day #1 for gait training and transfer. OT started day #1 for ADL and assisted devices.  Patient's IV and hemovac were removed on POD1.  Foley was removed shortly after surgery.  Implants: DePuy Attune size 4N posterior stabilized femoral component (cemented), size 4 rotating platform tibial component (cemented), 35 mm medialized dome patella (cemented), and a 7 mm stabilized rotating platform polyethylene insert.   She was given perioperative antibiotics:  Anti-infectives (From admission, onward)    Start     Dose/Rate Route Frequency Ordered Stop   12/01/23 1400  ceFAZolin (ANCEF) IVPB 2g/100 mL premix        2 g 200 mL/hr over 30 Minutes Intravenous Every 6 hours 12/01/23 1348  12/01/23 2129   12/01/23 0600  ceFAZolin (ANCEF) IVPB 2g/100 mL premix        2 g 200 mL/hr over 30 Minutes Intravenous On call to O.R. 12/01/23 0016 12/01/23 0801     .  She was given sequential compression devices, early ambulation, and aspirin for DVT prophylaxis.  She benefited maximally from the hospital stay and there were no complications.    Recent vital signs:  Vitals:   12/02/23 0535 12/02/23 0812  BP: 96/70 126/89  Pulse: 78 84  Resp: 16 16  Temp: 97.8 F (36.6 C) 98.2 F (36.8 C)  SpO2: 90% 97%    Recent laboratory studies:  Lab Results  Component Value Date   HGB 11.4 (L) 11/22/2023   HGB 11.6 (L) 11/15/2023   HGB 9.9 (L) 10/08/2023   Lab Results  Component Value Date   WBC 8.7 11/22/2023   PLT 233 11/22/2023   Lab Results  Component Value Date   INR 1.1 09/11/2022   Lab Results  Component Value Date   NA 137 11/22/2023   K 3.3 (L) 11/22/2023   CL 97 (L) 11/22/2023   CO2 32 11/22/2023   BUN 11 11/22/2023   CREATININE 1.04 (H) 11/22/2023   GLUCOSE 116 (H) 11/22/2023    Discharge Medications:   Allergies as of 12/02/2023       Reactions   Contrast Media [iodinated Contrast Media] Shortness Of Breath   Per CT report in 2014 pt had break through contrast reaction with hives and SOB following 13 hour prep. MSY    Ferumoxytol Nausea Only   Iron Sucrose Anaphylaxis   Lidocaine Hives   Metrizamide Shortness Of Breath   Penicillins Hives, Other (See Comments)   IgE = 4 (WNL) on 08/12/2022 ediate rash, facial/tongue/throat swelling, SOB or lightheadedness with hypotension, tachy   Sacubitril-valsartan    Sz (unclear if new start entresto vs hypoglycemia)   Isosorbide Nitrate Other (See Comments)   Headache   Latex Hives   IgE < 0.10 (WNL) on 08/21/2023   Ondansetron Other (See Comments)   Severe headache   Tizanidine Other (See Comments)   Feels altered   Povidone-iodine Rash   Blistering rash   Pulmicort [budesonide] Itching         Medication List     TAKE these medications    aspirin 81 MG chewable tablet Chew 1 tablet (81 mg total) by mouth 2 (two) times daily.   celecoxib 200 MG capsule Commonly known as: CELEBREX Take 1 capsule (200 mg total) by mouth 2 (two) times daily.   chlorhexidine 4 % external liquid Commonly known as: HIBICLENS Apply 15 mLs (1  Application total) topically as directed for 30 doses. Use as directed daily for 5 days every other week for 6 weeks.   escitalopram 10 MG tablet Commonly known as: LEXAPRO Take 10 mg by mouth daily.   gabapentin 300 MG capsule Commonly known as: NEURONTIN Take 1 capsule (300 mg total) by mouth 3 (three) times daily.   lacosamide 200 MG Tabs tablet Commonly known as: VIMPAT Take 1 tablet (200 mg total) by mouth 2 (two) times daily.   levothyroxine 175 MCG tablet Commonly known as: SYNTHROID Take 1 tablet (175 mcg total) by mouth daily at 6 (six) AM.   LORazepam 1 MG tablet Commonly known as: ATIVAN Take 1 tablet (1 mg total) by mouth 4 (four) times daily as needed.   losartan 25 MG tablet Commonly known as: COZAAR Take 12.5 mg by mouth in the morning and at bedtime.   methadone 5 MG tablet Commonly known as: DOLOPHINE Take 5 mg by mouth See admin instructions. Patient takes 1.5 tabs (7.5 mg) every morning, and 1.5 tabs (7.5 mg) midday,  and 2 tabs (10 mg) at bedtime.   methadone 5 MG tablet Commonly known as: DOLOPHINE Take 1.5 tablets (7.5 mg total) by mouth 2 (two) times daily at 10 am and 4 pm.   metoprolol succinate 50 MG 24 hr tablet Commonly known as: TOPROL-XL Take 50 mg by mouth 2 (two) times daily. Take with or immediately following a meal.   mupirocin ointment 2 % Commonly known as: BACTROBAN Place 1 Application into the nose 2 (two) times daily for 60 doses. Use as directed 2 times daily for 5 days every other week for 6 weeks.   Muscle Rub 10-15 % Crea Apply 1 Application topically as needed.   oxyCODONE-acetaminophen  10-325 MG tablet Commonly known as: PERCOCET Take 1 tablet by mouth every 4 (four) hours as needed for pain. What changed: Another medication with the same name was removed. Continue taking this medication, and follow the directions you see here.   potassium chloride SA 20 MEQ tablet Commonly known as: KLOR-CON M Take 20 mEq by mouth 2 (two) times daily.   promethazine 25 MG tablet Commonly known as: PHENERGAN Take 25 mg by mouth every 4 (four) hours as needed for nausea or vomiting.   rOPINIRole 4 MG tablet Commonly known as: REQUIP Take 4 mg by mouth at bedtime.   torsemide 20 MG tablet Commonly known as: DEMADEX Take 40 mg by mouth 2 (two) times daily.               Durable Medical Equipment  (From admission, onward)           Start     Ordered   12/01/23 1348  DME Walker rolling  Once       Question:  Patient needs a walker to treat with the following condition  Answer:  Total knee replacement status   12/01/23 1347   12/01/23 1348  DME Bedside commode  Once       Comments: Patient is not able to walk the distance required to go the bathroom, or he/she is unable to safely negotiate stairs required to access the bathroom.  A 3in1 BSC will alleviate this problem  Question:  Patient needs a bedside commode to treat with the following condition  Answer:  Total knee replacement status   12/01/23 1347           Diagnostic Studies: DG Knee Right Port Result Date: 12/01/2023 CLINICAL DATA:  Status post right knee arthroplasty. EXAM: PORTABLE RIGHT KNEE - 1-2 VIEW COMPARISON:  Right knee radiographs 05/13/2018 FINDINGS: Interval total right knee arthroplasty. No perihardware lucency is seen to indicate hardware failure or loosening. Expected postoperative changes including intra-articular and subcutaneous air. Smalljoint effusion. Anterior surgical skin staples. There is a superior approach surgical drain overlying the anterior femorotibial joint space. Old screw tracts  are seen within the distal femoral diaphysis and proximal tibial diaphysis. No acute fracture or dislocation. IMPRESSION: Interval total right knee arthroplasty without evidence of hardware failure. Electronically Signed   By: Neita Garnet M.D.   On: 12/01/2023 13:09   DG Humerus Right Result Date: 11/19/2023 CLINICAL DATA:  Right arm pain after fall EXAM: RIGHT HUMERUS - 2+ VIEW COMPARISON:  None Available. FINDINGS: There is no evidence of fracture or other focal bone lesions. Soft tissues are unremarkable. IMPRESSION: Negative. Electronically Signed   By: Minerva Fester M.D.   On: 11/19/2023 02:51   DG Chest Port 1 View Result Date: 11/19/2023 CLINICAL DATA:  Fall, right chest pain. EXAM: PORTABLE CHEST 1 VIEW COMPARISON:  11/15/2023 FINDINGS: Left chest wall ICD. Stable cardiomegaly. Low lung volumes accentuate pulmonary vascularity. No focal consolidation, pleural effusion, or pneumothorax. No displaced rib fractures. IMPRESSION: No active disease. Electronically Signed   By: Minerva Fester M.D.   On: 11/19/2023 02:51   DG Hip Unilat W or Wo Pelvis 2-3 Views Right Result Date: 11/19/2023 CLINICAL DATA:  Fall, pain in right humerus, right hip, and right chest EXAM: DG HIP (WITH OR WITHOUT PELVIS) 2-3V RIGHT COMPARISON:  None Available. FINDINGS: There is no evidence of hip fracture or dislocation. There is no evidence of arthropathy or other focal bone abnormality. IMPRESSION: Negative. Electronically Signed   By: Minerva Fester M.D.   On: 11/19/2023 02:50   DG Chest 2 View Result Date: 11/15/2023 CLINICAL DATA:  58 year old female with history of chest pain. Patient reports a history of CPR yesterday. Right-sided rib pain. Shortness of breath. EXAM: CHEST - 2 VIEW COMPARISON:  Multiple priors, most recently 10/06/2023. FINDINGS: Lung volumes are low. No consolidative airspace disease. No pleural effusions. No pneumothorax. No pulmonary nodule or mass noted. Pulmonary vasculature and the  cardiomediastinal silhouette are within normal limits. Left-sided pacemaker/AICD with lead tips projecting over the expected location of the right atrium and right ventricle. IMPRESSION: 1. Low lung volumes without radiographic evidence of acute cardiopulmonary disease. Electronically Signed   By: Trudie Reed M.D.   On: 11/15/2023 06:52   Disposition: Discharge disposition: 01-Home or Self Care     Plan for discharge home today pending progress with PT and BP.   Follow-up Information     Rayburn Go, PA-C Follow up on 12/18/2023.   Specialty: Orthopedic Surgery Why: at 10:30am Contact information: [address] [place] [address] [phone]         Donato Heinz, MD Follow up on 01/16/2024.   Specialty: Orthopedic Surgery Why: at 2      ;45pm Contact information: [address] [facility] [place] [address] [phone]                Signed: Meriel Pica PA-C 12/02/2023, 10:36 AM

## 2023-12-02 NOTE — Anesthesia Postprocedure Evaluation (Signed)
 Anesthesia Post Note  Patient: Brittany Nguyen  Procedure(s) Performed: COMPUTER ASSISTED TOTAL KNEE ARTHROPLASTY (Right: Knee)  Patient location during evaluation: Nursing Unit Anesthesia Type: Spinal Level of consciousness: oriented and awake and alert Pain management: pain level controlled Vital Signs Assessment: post-procedure vital signs reviewed and stable Respiratory status: spontaneous breathing and respiratory function stable Cardiovascular status: blood pressure returned to baseline and stable Postop Assessment: no headache, no backache, no apparent nausea or vomiting and patient able to bend at knees Anesthetic complications: no   No notable events documented.   Last Vitals:  Vitals:   12/02/23 0535 12/02/23 0812  BP: 96/70 126/89  Pulse: 78 84  Resp: 16 16  Temp: 36.6 C 36.8 C  SpO2: 90% 97%    Last Pain:  Vitals:   12/02/23 0812  TempSrc: Oral  PainSc:                  Cleda Mccreedy Halsey Persaud

## 2023-12-02 NOTE — Progress Notes (Signed)
 Subjective: 1 Day Post-Op Procedure(s) (LRB): COMPUTER ASSISTED TOTAL KNEE ARTHROPLASTY (Right) Patient reports pain as mild in the right knee. Patient is well, and has had no acute complaints or problems Plan is to go Home after hospital stay. Negative for chest pain and shortness of breath Fever: no Gastrointestinal:Negative for nausea and vomiting Reports she is passing gas this AM.  Objective: Vital signs in last 24 hours: Temp:  [96.6 F (35.9 C)-98.6 F (37 C)] 98.2 F (36.8 C) (03/29 0812) Pulse Rate:  [57-105] 84 (03/29 0812) Resp:  [9-17] 16 (03/29 0812) BP: (88-126)/(58-89) 126/89 (03/29 0812) SpO2:  [90 %-100 %] 97 % (03/29 0812)  Intake/Output from previous day:  Intake/Output Summary (Last 24 hours) at 12/02/2023 0816 Last data filed at 12/02/2023 0657 Gross per 24 hour  Intake 1845.68 ml  Output 420 ml  Net 1425.68 ml    Intake/Output this shift: No intake/output data recorded.  Labs: No results for input(s): "HGB" in the last 72 hours. No results for input(s): "WBC", "RBC", "HCT", "PLT" in the last 72 hours. No results for input(s): "NA", "K", "CL", "CO2", "BUN", "CREATININE", "GLUCOSE", "CALCIUM" in the last 72 hours. No results for input(s): "LABPT", "INR" in the last 72 hours.   EXAM General - Patient is Alert, Appropriate, and Oriented Extremity - ABD soft Neurovascular intact Dorsiflexion/Plantar flexion intact Incision: Bulky dressing intact. Dressing/Incision - Bulky dressing removed.  No drainage to the incision site.  Hemovac removed without issue. Motor Function - intact, moving foot and toes well on exam.  Abdomen soft with intact bowel sounds.  Past Medical History:  Diagnosis Date   Acute anterolateral wall MI (HCC) 08/2016   Acute respiratory failure (HCC)    AICD (automatic cardioverter/defibrillator) present 03/29/2017   a.) Medtronic Evera MRI XT DR SureScan (SN: [id number])   Anxiety    a.) on BZO PRN (lorazepam)   Arthritis     Asthma 2013   Breast cancer, right (HCC)    C. 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   a.) Bx 02/22/2018 --> NE tumor cells --> AE1/AE3, chromogranin, synaptophysin (+); Ki-67 proliferation rate <1%; b.) repeat Bx 08/24/2018 --> well differentiard NET (G1)   CHF (congestive heart failure) (HCC)    Chronic, continuous use of opioids    a.) has naloxone Rx available   COPD (chronic obstructive pulmonary disease) (HCC)    Coronary artery disease    Depression    Diet-controlled type 2 diabetes mellitus (HCC)    Diverticulitis 2010   DVT (deep venous thrombosis) (HCC)    Endometriosis 1990   Generalized epilepsy (HCC)    a.) on lacosamide; b.) seizure 07-30-22 in setting of hypokalemia (K+ 2.6); c.) last seizure 11/13/2023 during blood draw   GERD (gastroesophageal reflux disease)    GIB (gastrointestinal bleeding) 01/08/2018   H/O syncope    HFrEF (heart failure with reduced ejection fraction) (HCC)    a.)TTE 12/09/16: EF 25-30%, dif HK; b.) TTE 02/08/17: EF 25%, dif HK, mild TR, G1DD; c.) TTE 04/25/17: EF 30-35%, mild LVH; d.) TTE 01/30/18: EF 45%, MAC, LAE, mild MR, G1DD; e.) TTE 11/06/2018: EF 25%, sev glob HK, triv MR/TR/PR; f.) TTE 05/12/19: EF 35%, MAC, AoV sclerosis, G1DD; g.) TTE 11/05/19: EF 20%, sev glob HK, triv MR; h.) TTE 06/17/20: EF 40%, mod glob HK, triv MR; I:) TTE 06/14/22: EF 25-30%, G1DD   History of cardiac catheterization    a.) LHC 10/21/2014: normal coronaries; b.) R/LHC 02/24/2017:  normal coronaries, mRA 12, mPA 28, mPCWP 15, CO 8.0, CI 3.8   History of kidney stones    Hx MRSA infection    Hypertension    Hypokalemia    Hypothyroidism    a.) s/p thyroidectomy   Incomplete left bundle branch block (LBBB)    Iron deficiency anemia 03/27/2018   Lump or mass in breast 11/27/2012   a.) Bx 11/27/2012 --> apocrine wall cyst   Migraine    Nausea & vomiting    Neuropathy    Non-ischemic cardiomyopathy (HCC)    a.) TTE  12/09/2016: EF 25-30%, b.) TTE 02/08/2017: EF 25%; c.) Medtronic AICD placed 03/29/2017; d.) TTE 04/25/2017: EF 30-35%; e.) TTE 01/30/2018: EF 45%; f.) TTE 11/06/2018: EF 25%; g.) TTE 05/12/2019: EF 35%; h.) TTE 11/05/2019: EF 20%; I.) TTE 06/17/2020: EF 40%; J.) TTE 06/14/2022: EF 25-30%   Obesity    OSA on CPAP    PAF (paroxysmal atrial fibrillation) (HCC)    Pneumonia    PONV (postoperative nausea and vomiting)    Post-COVID chronic cough    Postcholecystectomy diarrhea    Prolonged Q-T interval on ECG    RAD (reactive airway disease)    Restless leg    a.) on ropinirole   Sepsis (HCC)    Unstable angina (HCC)     Assessment/Plan: 1 Day Post-Op Procedure(s) (LRB): COMPUTER ASSISTED TOTAL KNEE ARTHROPLASTY (Right) Principal Problem:   History of total knee arthroplasty, right  Estimated body mass index is 33.28 kg/m as calculated from the following:   Height as of this encounter: 5\' 5"  (1.651 m).   Weight as of this encounter: 90.7 kg. Advance diet Up with therapy D/C IV fluids when tolerating po intake.  Labs and vitals reviewed BP has been soft, up to 126/89.  Continue to monitor, esp with PT.  Patient with CHF, balance fluid hydration. Bulky dressing removed, Hemovac removed without issue.  4x4 with tegaderm placed over drain site. Discussed continuing her chronic pain medication after surgery.  Will use her PRN percocet more routinely after surgery. Up with therapy today. Plan for d/c home pending therapy.   DVT Prophylaxis - Aspirin and TED hose Weight-Bearing as tolerated to right leg  J. Horris Latino, PA-C [facility] Orthopaedic Surgery 12/02/2023, 8:16 AM

## 2023-12-02 NOTE — Progress Notes (Signed)
 Physical Therapy Treatment Patient Details Name: Brittany Nguyen MRN: [id number] DOB: 1966-05-27 Today's Date: 12/02/2023   History of Present Illness Pt is a 58 yo F diagnosed with degenerative arthrosis of the right knee and is s/p elective TKA. PMH includes HTN, CAD, MI, CHF with EF 25%, A-fib, COPD, seizures, anxitey, depression, AICD, L TKA, and DM.    PT Comments  Pt was pleasant and motivated to participate during the session and put forth good effort throughout. Pt required no physical assistance with functional tasks per below and presented with good control and stability with all standing activities.  Pt was able to amb 200+ feet with a RW, transfer to/from various height surfaces, and ascend/descend steps utilizing multiple strategies all with only cuing/training for proper sequencing with pt able to demonstrate good carryover.  Pt reported no adverse symptoms during the session other than R knee pain that did not increase with activity and with SpO2 and HR WNL throughout on room air.  Pt will benefit from continued PT services upon discharge to safely address deficits listed in patient problem list for decreased caregiver assistance and eventual return to PLOF.         If plan is discharge home, recommend the following: A little help with walking and/or transfers;A little help with bathing/dressing/bathroom;Assistance with cooking/housework;Help with stairs or ramp for entrance;Assist for transportation   Can travel by private vehicle        Equipment Recommendations  Rolling walker (2 wheels);BSC/3in1    Recommendations for Other Services       Precautions / Restrictions Precautions Precautions: Fall Recall of Precautions/Restrictions: Intact Restrictions Weight Bearing Restrictions Per Provider Order: Yes RLE Weight Bearing Per Provider Order: Weight bearing as tolerated Other Position/Activity Restrictions: Pt able to perform Ind RLE SLR without extensor lag, no KI  required     Mobility  Bed Mobility Overal bed mobility: Modified Independent             General bed mobility comments: Min extra time, effort, and use of the bed rail only    Transfers Overall transfer level: Needs assistance Equipment used: Rolling walker (2 wheels) Transfers: Sit to/from Stand Sit to Stand: Supervision           General transfer comment: Min verbal and visual cues for sequencing for hand and R foot placement; good control and stability throughout    Ambulation/Gait Ambulation/Gait assistance: Contact guard assist Gait Distance (Feet): 200 Feet Assistive device: Rolling walker (2 wheels) Gait Pattern/deviations: Decreased step length - left, Step-through pattern, Antalgic, Decreased stance time - right Gait velocity: decreased     General Gait Details: Step-through pattern but mildly antalgic, no overt LOB or buckling   Stairs Stairs: Yes Stairs assistance: Contact guard assist Stair Management: Two rails, Step to pattern, Forwards, Backwards, With walker Number of Stairs: 4 General stair comments: Pt able to ascend/descend 4 steps with bil rails and step-to pattern with very good control and stability; pt also able to ascend one step backwards and descend forwards with a RW to simulate getting on/off the runner of the large truck that she'll be going home in, very good control, stability, and carryover of proper sequencing   Wheelchair Mobility     Tilt Bed    Modified Rankin (Stroke Patients Only)       Balance Overall balance assessment: Needs assistance   Sitting balance-Leahy Scale: Normal     Standing balance support: Bilateral upper extremity supported, During functional activity Standing balance-Leahy Scale:  Good                              Communication Communication Communication: No apparent difficulties  Cognition Arousal: Alert Behavior During Therapy: WFL for tasks assessed/performed   PT -  Cognitive impairments: No apparent impairments                         Following commands: Intact      Cueing Cueing Techniques: Verbal cues, Visual cues, Tactile cues  Exercises Total Joint Exercises Quad Sets: AROM, Strengthening, Right, 10 reps Straight Leg Raises: AROM, Strengthening, 5 reps, Both Long Arc Quad: AROM, Strengthening, Right, 5 reps, 10 reps Knee Flexion: AROM, Strengthening, Right, 5 reps, 10 reps Goniometric ROM: R knee AROM: 2-73 deg Other Exercises Other Exercises: Car transfer sequencing practice Other Exercises: HEP education per handout Other Exercises: R knee positioning education to promote R knee ext PROM and prevent heel pressure    General Comments General comments (skin integrity, edema, etc.): orthostatic vitals taken and [facility]      Pertinent Vitals/Pain Pain Assessment Pain Assessment: 0-10 Pain Score: 7  Pain Location: R knee Pain Descriptors / Indicators: Sore Pain Intervention(s): Repositioned, Premedicated before session, Monitored during session, Ice applied    Home Living Family/patient expects to be discharged to:: Private residence Living Arrangements: Spouse/significant other Available Help at Discharge: Family;Available 24 hours/day Type of Home: Mobile home Home Access: Stairs to enter Entrance Stairs-Rails: Can reach both;Right;Left Entrance Stairs-Number of Steps: 8   Home Layout: One level Home Equipment: Grab bars - tub/shower;Shower seat - built in;Cane - single point      Prior Function            PT Goals (current goals can now be found in the care plan section) Progress towards PT goals: Progressing toward goals    Frequency    BID      PT Plan      Co-evaluation              AM-PAC PT "6 Clicks" Mobility   Outcome Measure  Help needed turning from your back to your side while in a flat bed without using bedrails?: A Little Help needed moving from lying on your back to sitting on the  side of a flat bed without using bedrails?: A Little Help needed moving to and from a bed to a chair (including a wheelchair)?: A Little Help needed standing up from a chair using your arms (e.g., wheelchair or bedside chair)?: A Little Help needed to walk in hospital room?: A Little Help needed climbing 3-5 steps with a railing? : A Little 6 Click Score: 18    End of Session Equipment Utilized During Treatment: Gait belt Activity Tolerance: Patient tolerated treatment well Patient left: in bed;with call bell/phone within reach;with bed alarm set;with SCD's reapplied;Other (comment) (Polar care donned to R knee) Nurse Communication: Mobility status;Weight bearing status PT Visit Diagnosis: Other abnormalities of gait and mobility (R26.89);Muscle weakness (generalized) (M62.81);Pain Pain - Right/Left: Right Pain - part of body: Knee     Time: 2952-8413 PT Time Calculation (min) (ACUTE ONLY): 42 min  Charges:    $Gait Training: 8-22 mins $Therapeutic Exercise: 8-22 mins $Therapeutic Activity: 8-22 mins PT General Charges $$ ACUTE PT VISIT: 1 Visit                     D. Lorin Picket Kemba Hoppes  PT, DPT 12/02/23, 10:36 AM

## 2023-12-02 NOTE — TOC Progression Note (Signed)
 Transition of Care [facility]) - Progression Note    Patient Details  Name: Brittany Nguyen MRN: [id number] Date of Birth: 06/24/1966  [facility] Care [facility]) CM/SW Contact  Liliana Cline, LCSW Phone Number: 12/02/2023, 10:48 AM  Clinical Narrative:    Patient has orders to DC home. CSW spoke with patient. Patient states she does not have a 3in1 or RW at home, needs one ordered. Referral made to [facility] with [employer] for bedside delivery today prior to discharge. Patient states she is aware that she was prearranged by [employer] with [facility] and they have already contacted her. CSW notified Laurelyn Sickle with [facility] of DC today.  Expected Discharge Plan: Home w Home Health Services Barriers to Discharge: Barriers Resolved  Expected Discharge Plan and Services   Discharge Planning Services: CM Consult   Living arrangements for the past 2 months: Single Family Home Expected Discharge Date: 12/02/23               DME Arranged: Dan Humphreys rolling, 3-N-1 DME Agency: [employer] Date DME Agency Contacted: 12/02/23   Representative spoke with at DME Agency: Christella Scheuermann HH Arranged: PT, OT HH Agency: [facility] Date [facility] Agency Contacted: 12/02/23 Time HH Agency Contacted: 1438 Representative spoke with at [facility] Agency: Laurelyn Sickle   Social Determinants of Health (SDOH) Interventions SDOH Screenings   Food Insecurity: No Food Insecurity (12/01/2023)  Recent Concern: Food Insecurity - Food Insecurity Present (11/24/2023)   Received from [facility]  Housing: Low Risk  (12/01/2023)  Transportation Needs: No Transportation Needs (12/01/2023)  Utilities: Not At Risk (12/01/2023)  Financial Resource Strain: High Risk (11/24/2023)   Received from [facility]  Physical Activity: Insufficiently Active (12/07/2020)   Received from [facility], [facility]  Social Connections: Socially Integrated (12/01/2023)  Stress:  Stress Concern Present (12/07/2020)   Received from [facility], [facility]  Tobacco Use: Low Risk  (12/01/2023)    Readmission Risk Interventions     No data to display

## 2023-12-02 NOTE — Evaluation (Signed)
 Occupational Therapy Evaluation Patient Details Name: Brittany Nguyen MRN: [id number] DOB: October 02, 1965 Today's Date: 12/02/2023   History of Present Illness   Pt is a 58 yo F diagnosed with degenerative arthrosis of the right knee and is s/p elective TKA. PMH includes HTN, CAD, MI, CHF with EF 25%, A-fib, COPD, seizures, anxitey, depression, AICD, L TKA, and DM.     Clinical Impressions Pt was seen for OT evaluation this date POD 1 from R TKA. Prior to hospital admission, pt was living at home with her husband and IND with all tasks. She had a L TKA last year. BP stable throughout session with orthostatics negative. Pt currently requires MOD I for bed mobility, SUP for STS and mobility in room to get to recliner. Set up assist for LB dressing tasks and Max A to don R compression hose. Pt instructed in polar care mgt, falls prevention strategies, home/routines modifications, DME/AE for LB bathing and dressing tasks, and compression stocking mgt. Provided handout for carryover of information. Edu on placement of BSC over toilet upon return home if needed for ease/pain management. Pt does not have any further acute OT needs and will sign off in house with no need for follow up therapy on DC from hospital.     If plan is discharge home, recommend the following:   A little help with walking and/or transfers     Functional Status Assessment   Patient has not had a recent decline in their functional status     Equipment Recommendations   BSC/3in1     Recommendations for Other Services         Precautions/Restrictions   Precautions Precautions: Fall Recall of Precautions/Restrictions: Intact Restrictions Weight Bearing Restrictions Per Provider Order: Yes RLE Weight Bearing Per Provider Order: Weight bearing as tolerated     Mobility Bed Mobility Overal bed mobility: Modified Independent                  Transfers Overall transfer level: Needs  assistance Equipment used: Rolling walker (2 wheels) Transfers: Sit to/from Stand Sit to Stand: Supervision           General transfer comment: SUP for STS and mobility to the recliner      Balance Overall balance assessment: Needs assistance   Sitting balance-Leahy Scale: Normal     Standing balance support: Bilateral upper extremity supported, During functional activity Standing balance-Leahy Scale: Good                             ADL either performed or assessed with clinical judgement   ADL Overall ADL's : Needs assistance/impaired                     Lower Body Dressing: Set up Lower Body Dressing Details (indicate cue type and reason): to don socks, underwear and pants Toilet Transfer: Supervision/safety;Rolling walker (2 wheels) Toilet Transfer Details (indicate cue type and reason): simulated to recliner                 Vision         Perception         Praxis         Pertinent Vitals/Pain Pain Assessment Pain Assessment: 0-10 Pain Score: 7  Pain Location: R knee Pain Descriptors / Indicators: Sore Pain Intervention(s): Monitored during session, Repositioned     Extremity/Trunk Assessment Upper Extremity Assessment Upper Extremity Assessment: Overall [facility] for  tasks assessed   Lower Extremity Assessment Lower Extremity Assessment: RLE deficits/detail RLE Deficits / Details: s/p TKA       Communication Communication Communication: No apparent difficulties   Cognition Arousal: Alert Behavior During Therapy: WFL for tasks assessed/performed                                 Following commands: Intact       Cueing  General Comments   Cueing Techniques: Verbal cues;Visual cues;Tactile cues  orthostatic vitals taken and [facility]   Exercises Other Exercises Other Exercises: Edu on role of OT in acute setting and provided written TKA handout with information on polar care, dressing, donning compression  hose, etc.   Shoulder Instructions      Home Living Family/patient expects to be discharged to:: Private residence Living Arrangements: Spouse/significant other Available Help at Discharge: Family;Available 24 hours/day Type of Home: Mobile home Home Access: Stairs to enter Entrance Stairs-Number of Steps: 8 Entrance Stairs-Rails: Can reach both;Right;Left Home Layout: One level     Bathroom Shower/Tub: Chief Strategy Officer: Standard Bathroom Accessibility: Yes   Home Equipment: Grab bars - tub/shower;Shower seat - built in;Cane - single point          Prior Functioning/Environment Prior Level of Function : Independent/Modified Independent;Driving             Mobility Comments: Ind amb community distances without an AD, no fall history ADLs Comments: Ind with ADLs    OT Problem List: Pain   OT Treatment/Interventions:        OT Goals(Current goals can be found in the care plan section)       OT Frequency:       Co-evaluation              AM-PAC OT "6 Clicks" Daily Activity     Outcome Measure Help from another person eating meals?: None Help from another person taking care of personal grooming?: None Help from another person toileting, which includes using toliet, bedpan, or urinal?: A Little Help from another person bathing (including washing, rinsing, drying)?: A Little Help from another person to put on and taking off regular upper body clothing?: None Help from another person to put on and taking off regular lower body clothing?: A Little 6 Click Score: 21   End of Session Equipment Utilized During Treatment: Rolling walker (2 wheels) Nurse Communication: Mobility status  Activity Tolerance: Patient tolerated treatment well Patient left: in chair;with call bell/phone within reach  OT Visit Diagnosis: Other abnormalities of gait and mobility (R26.89)                Time: 7829-5621 OT Time Calculation (min): 26 min Charges:   OT General Charges $OT Visit: 1 Visit OT Evaluation $OT Eval Moderate Complexity: 1 Mod OT Treatments $Self Care/Home Management : 8-22 mins  Alya Smaltz, OTR/L  12/02/23, 10:37 AM   Constance Goltz 12/02/2023, 10:34 AM

## 2023-12-02 NOTE — Progress Notes (Signed)
 Patient voiding QS. Voided 3 times during the night shift. Denied discomfort and bladder non-distended.Taking in adequate amount of fluids therefore IV fluid discontinued.

## 2023-12-02 NOTE — Progress Notes (Signed)
 Patient discharging home, all belongings sent with patient. Equipment delivered to room at 1530, ride to pick up patient at 1630. All discharge education and teaching provided, all questions answered. IVs removed.

## 2023-12-03 DIAGNOSIS — I2510 Atherosclerotic heart disease of native coronary artery without angina pectoris: Secondary | ICD-10-CM | POA: Diagnosis not present

## 2023-12-03 DIAGNOSIS — G40909 Epilepsy, unspecified, not intractable, without status epilepticus: Secondary | ICD-10-CM | POA: Diagnosis not present

## 2023-12-03 DIAGNOSIS — Z471 Aftercare following joint replacement surgery: Secondary | ICD-10-CM | POA: Diagnosis not present

## 2023-12-03 DIAGNOSIS — I110 Hypertensive heart disease with heart failure: Secondary | ICD-10-CM | POA: Diagnosis not present

## 2023-12-03 DIAGNOSIS — J4489 Other specified chronic obstructive pulmonary disease: Secondary | ICD-10-CM | POA: Diagnosis not present

## 2023-12-03 DIAGNOSIS — G43909 Migraine, unspecified, not intractable, without status migrainosus: Secondary | ICD-10-CM | POA: Diagnosis not present

## 2023-12-03 DIAGNOSIS — E1140 Type 2 diabetes mellitus with diabetic neuropathy, unspecified: Secondary | ICD-10-CM | POA: Diagnosis not present

## 2023-12-03 DIAGNOSIS — I5020 Unspecified systolic (congestive) heart failure: Secondary | ICD-10-CM | POA: Diagnosis not present

## 2023-12-03 DIAGNOSIS — I480 Paroxysmal atrial fibrillation: Secondary | ICD-10-CM | POA: Diagnosis not present

## 2023-12-04 ENCOUNTER — Encounter: Payer: Self-pay | Admitting: Orthopedic Surgery

## 2023-12-05 ENCOUNTER — Encounter: Payer: Medicare HMO | Admitting: Occupational Therapy

## 2023-12-05 ENCOUNTER — Encounter: Payer: Self-pay | Admitting: Oncology

## 2023-12-05 DIAGNOSIS — Z471 Aftercare following joint replacement surgery: Secondary | ICD-10-CM | POA: Diagnosis not present

## 2023-12-05 DIAGNOSIS — I110 Hypertensive heart disease with heart failure: Secondary | ICD-10-CM | POA: Diagnosis not present

## 2023-12-05 DIAGNOSIS — I480 Paroxysmal atrial fibrillation: Secondary | ICD-10-CM | POA: Diagnosis not present

## 2023-12-05 DIAGNOSIS — I5020 Unspecified systolic (congestive) heart failure: Secondary | ICD-10-CM | POA: Diagnosis not present

## 2023-12-05 DIAGNOSIS — E1140 Type 2 diabetes mellitus with diabetic neuropathy, unspecified: Secondary | ICD-10-CM | POA: Diagnosis not present

## 2023-12-05 DIAGNOSIS — I2510 Atherosclerotic heart disease of native coronary artery without angina pectoris: Secondary | ICD-10-CM | POA: Diagnosis not present

## 2023-12-05 DIAGNOSIS — J4489 Other specified chronic obstructive pulmonary disease: Secondary | ICD-10-CM | POA: Diagnosis not present

## 2023-12-05 DIAGNOSIS — G40909 Epilepsy, unspecified, not intractable, without status epilepticus: Secondary | ICD-10-CM | POA: Diagnosis not present

## 2023-12-05 DIAGNOSIS — G43909 Migraine, unspecified, not intractable, without status migrainosus: Secondary | ICD-10-CM | POA: Diagnosis not present

## 2023-12-06 ENCOUNTER — Other Ambulatory Visit: Payer: Self-pay

## 2023-12-06 ENCOUNTER — Emergency Department

## 2023-12-06 ENCOUNTER — Observation Stay
Admission: EM | Admit: 2023-12-06 | Discharge: 2023-12-08 | Disposition: A | Discharge status: Home Health | Attending: Student in an Organized Health Care Education/Training Program | Admitting: Student in an Organized Health Care Education/Training Program

## 2023-12-06 DIAGNOSIS — I428 Other cardiomyopathies: Secondary | ICD-10-CM

## 2023-12-06 DIAGNOSIS — D649 Anemia, unspecified: Secondary | ICD-10-CM | POA: Diagnosis not present

## 2023-12-06 DIAGNOSIS — R110 Nausea: Secondary | ICD-10-CM

## 2023-12-06 DIAGNOSIS — C7A8 Other malignant neuroendocrine tumors: Secondary | ICD-10-CM | POA: Diagnosis present

## 2023-12-06 DIAGNOSIS — I10 Essential (primary) hypertension: Secondary | ICD-10-CM | POA: Diagnosis present

## 2023-12-06 DIAGNOSIS — C7A1 Malignant poorly differentiated neuroendocrine tumors: Secondary | ICD-10-CM | POA: Insufficient documentation

## 2023-12-06 DIAGNOSIS — G4733 Obstructive sleep apnea (adult) (pediatric): Secondary | ICD-10-CM

## 2023-12-06 DIAGNOSIS — E039 Hypothyroidism, unspecified: Secondary | ICD-10-CM | POA: Diagnosis not present

## 2023-12-06 DIAGNOSIS — K5730 Diverticulosis of large intestine without perforation or abscess without bleeding: Secondary | ICD-10-CM | POA: Diagnosis not present

## 2023-12-06 DIAGNOSIS — E1140 Type 2 diabetes mellitus with diabetic neuropathy, unspecified: Secondary | ICD-10-CM | POA: Insufficient documentation

## 2023-12-06 DIAGNOSIS — Z96653 Presence of artificial knee joint, bilateral: Secondary | ICD-10-CM | POA: Diagnosis not present

## 2023-12-06 DIAGNOSIS — G2581 Restless legs syndrome: Secondary | ICD-10-CM | POA: Diagnosis present

## 2023-12-06 DIAGNOSIS — I959 Hypotension, unspecified: Principal | ICD-10-CM | POA: Diagnosis present

## 2023-12-06 DIAGNOSIS — R29818 Other symptoms and signs involving the nervous system: Secondary | ICD-10-CM | POA: Insufficient documentation

## 2023-12-06 DIAGNOSIS — Z86718 Personal history of other venous thrombosis and embolism: Secondary | ICD-10-CM | POA: Diagnosis not present

## 2023-12-06 DIAGNOSIS — I110 Hypertensive heart disease with heart failure: Secondary | ICD-10-CM | POA: Insufficient documentation

## 2023-12-06 DIAGNOSIS — R609 Edema, unspecified: Secondary | ICD-10-CM | POA: Diagnosis not present

## 2023-12-06 DIAGNOSIS — Z91040 Latex allergy status: Secondary | ICD-10-CM | POA: Diagnosis not present

## 2023-12-06 DIAGNOSIS — R0602 Shortness of breath: Secondary | ICD-10-CM

## 2023-12-06 DIAGNOSIS — E861 Hypovolemia: Secondary | ICD-10-CM | POA: Diagnosis not present

## 2023-12-06 DIAGNOSIS — Z7982 Long term (current) use of aspirin: Secondary | ICD-10-CM | POA: Insufficient documentation

## 2023-12-06 DIAGNOSIS — I4891 Unspecified atrial fibrillation: Secondary | ICD-10-CM | POA: Insufficient documentation

## 2023-12-06 DIAGNOSIS — R0989 Other specified symptoms and signs involving the circulatory and respiratory systems: Secondary | ICD-10-CM | POA: Diagnosis not present

## 2023-12-06 DIAGNOSIS — I2510 Atherosclerotic heart disease of native coronary artery without angina pectoris: Secondary | ICD-10-CM | POA: Insufficient documentation

## 2023-12-06 DIAGNOSIS — F419 Anxiety disorder, unspecified: Secondary | ICD-10-CM | POA: Diagnosis not present

## 2023-12-06 DIAGNOSIS — R569 Unspecified convulsions: Secondary | ICD-10-CM | POA: Diagnosis not present

## 2023-12-06 DIAGNOSIS — Z95810 Presence of automatic (implantable) cardiac defibrillator: Secondary | ICD-10-CM | POA: Diagnosis present

## 2023-12-06 DIAGNOSIS — R5381 Other malaise: Secondary | ICD-10-CM | POA: Diagnosis present

## 2023-12-06 DIAGNOSIS — R4182 Altered mental status, unspecified: Secondary | ICD-10-CM | POA: Diagnosis not present

## 2023-12-06 DIAGNOSIS — I5022 Chronic systolic (congestive) heart failure: Secondary | ICD-10-CM | POA: Insufficient documentation

## 2023-12-06 DIAGNOSIS — G629 Polyneuropathy, unspecified: Secondary | ICD-10-CM

## 2023-12-06 DIAGNOSIS — R918 Other nonspecific abnormal finding of lung field: Secondary | ICD-10-CM | POA: Diagnosis not present

## 2023-12-06 DIAGNOSIS — K219 Gastro-esophageal reflux disease without esophagitis: Secondary | ICD-10-CM | POA: Diagnosis not present

## 2023-12-06 DIAGNOSIS — E119 Type 2 diabetes mellitus without complications: Secondary | ICD-10-CM

## 2023-12-06 LAB — BRAIN NATRIURETIC PEPTIDE: B Natriuretic Peptide: 12.3 pg/mL (ref 0.0–100.0)

## 2023-12-06 LAB — CBC WITH DIFFERENTIAL/PLATELET
Abs Immature Granulocytes: 0.09 10*3/uL — ABNORMAL HIGH (ref 0.00–0.07)
Basophils Absolute: 0.1 10*3/uL (ref 0.0–0.1)
Basophils Relative: 1 %
Eosinophils Absolute: 0.4 10*3/uL (ref 0.0–0.5)
Eosinophils Relative: 5 %
HCT: 27.8 % — ABNORMAL LOW (ref 36.0–46.0)
Hemoglobin: 9 g/dL — ABNORMAL LOW (ref 12.0–15.0)
Immature Granulocytes: 1 %
Lymphocytes Relative: 19 %
Lymphs Abs: 1.6 10*3/uL (ref 0.7–4.0)
MCH: 28.5 pg (ref 26.0–34.0)
MCHC: 32.4 g/dL (ref 30.0–36.0)
MCV: 88 fL (ref 80.0–100.0)
Monocytes Absolute: 0.5 10*3/uL (ref 0.1–1.0)
Monocytes Relative: 6 %
Neutro Abs: 6 10*3/uL (ref 1.7–7.7)
Neutrophils Relative %: 68 %
Platelets: 273 10*3/uL (ref 150–400)
RBC: 3.16 MIL/uL — ABNORMAL LOW (ref 3.87–5.11)
RDW: 16.2 % — ABNORMAL HIGH (ref 11.5–15.5)
WBC: 8.7 10*3/uL (ref 4.0–10.5)
nRBC: 0 % (ref 0.0–0.2)

## 2023-12-06 LAB — COMPREHENSIVE METABOLIC PANEL WITH GFR
ALT: 13 U/L (ref 0–44)
AST: 23 U/L (ref 15–41)
Albumin: 3.3 g/dL — ABNORMAL LOW (ref 3.5–5.0)
Alkaline Phosphatase: 82 U/L (ref 38–126)
Anion gap: 10 (ref 5–15)
BUN: 10 mg/dL (ref 6–20)
CO2: 31 mmol/L (ref 22–32)
Calcium: 8.2 mg/dL — ABNORMAL LOW (ref 8.9–10.3)
Chloride: 97 mmol/L — ABNORMAL LOW (ref 98–111)
Creatinine, Ser: 1.03 mg/dL — ABNORMAL HIGH (ref 0.44–1.00)
GFR, Estimated: >60 mL/min (ref >60)
Glucose, Bld: 125 mg/dL — ABNORMAL HIGH (ref 70–99)
Potassium: 3.3 mmol/L — ABNORMAL LOW (ref 3.5–5.1)
Sodium: 138 mmol/L (ref 135–145)
Total Bilirubin: 0.6 mg/dL (ref 0.0–1.2)
Total Protein: 6.7 g/dL (ref 6.5–8.1)

## 2023-12-06 LAB — URINALYSIS, W/ REFLEX TO CULTURE (INFECTION SUSPECTED)
Bacteria, UA: NONE SEEN
Bilirubin Urine: NEGATIVE
Glucose, UA: NEGATIVE mg/dL
Hgb urine dipstick: NEGATIVE
Ketones, ur: NEGATIVE mg/dL
Leukocytes,Ua: NEGATIVE
Nitrite: NEGATIVE
Protein, ur: NEGATIVE mg/dL
RBC / HPF: 0 RBC/hpf (ref 0–5)
Specific Gravity, Urine: 1.009 (ref 1.005–1.030)
pH: 5 (ref 5.0–8.0)

## 2023-12-06 LAB — TROPONIN I (HIGH SENSITIVITY)
Troponin I (High Sensitivity): 3 ng/L (ref <18)
Troponin I (High Sensitivity): <2 ng/L (ref <18)

## 2023-12-06 LAB — LIPASE, BLOOD: Lipase: 26 U/L (ref 11–51)

## 2023-12-06 LAB — PROTIME-INR
INR: 1.1 (ref 0.8–1.2)
Prothrombin Time: 14.4 s (ref 11.4–15.2)

## 2023-12-06 LAB — LACTIC ACID, PLASMA
Lactic Acid, Venous: 2.2 mmol/L (ref 0.5–1.9)
Lactic Acid, Venous: 1.9 mmol/L (ref 0.5–1.9)

## 2023-12-06 LAB — APTT: aPTT: 26 s (ref 24–36)

## 2023-12-06 MED ORDER — ACETAMINOPHEN 325 MG PO TABS: 650.0000 mg | ORAL_TABLET | Freq: Four times a day (QID) | ORAL | Status: DC | PRN

## 2023-12-06 MED ORDER — OXYCODONE-ACETAMINOPHEN 10-325 MG PO TABS: 1.0000 | ORAL_TABLET | ORAL | Status: DC | PRN

## 2023-12-06 MED ORDER — SODIUM CHLORIDE 0.9 % IV BOLUS
500.0000 mL | Freq: Once | INTRAVENOUS | Status: AC
  Administered 2023-12-06: 500 mL via INTRAVENOUS

## 2023-12-06 MED ORDER — ROPINIROLE HCL 1 MG PO TABS
4.0000 mg | ORAL_TABLET | Freq: Every day | ORAL | Status: DC
Start: 2023-12-06 — End: 2023-12-08
  Administered 2023-12-06 – 2023-12-07 (×2): 4 mg via ORAL
  Filled 2023-12-06 (×2): qty 4

## 2023-12-06 MED ORDER — OXYCODONE-ACETAMINOPHEN 5-325 MG PO TABS
1.0000 | ORAL_TABLET | ORAL | Status: DC | PRN
  Administered 2023-12-06 – 2023-12-08 (×8): 1 via ORAL
  Filled 2023-12-06 (×8): qty 1

## 2023-12-06 MED ORDER — LACOSAMIDE 50 MG PO TABS
200.0000 mg | ORAL_TABLET | Freq: Two times a day (BID) | ORAL | Status: DC
  Administered 2023-12-06 – 2023-12-08 (×4): 200 mg via ORAL
  Filled 2023-12-06 (×4): qty 4

## 2023-12-06 MED ORDER — LORAZEPAM 2 MG/ML IJ SOLN
1.0000 mg | Freq: Four times a day (QID) | INTRAMUSCULAR | Status: DC | PRN
Start: 2023-12-06 — End: 2023-12-06

## 2023-12-06 MED ORDER — POTASSIUM CHLORIDE CRYS ER 20 MEQ PO TBCR
20.0000 meq | EXTENDED_RELEASE_TABLET | Freq: Two times a day (BID) | ORAL | Status: DC
  Administered 2023-12-06 – 2023-12-08 (×2): 20 meq via ORAL
  Filled 2023-12-06 (×3): qty 1

## 2023-12-06 MED ORDER — METHADONE HCL 10 MG PO TABS
10.0000 mg | ORAL_TABLET | Freq: Every day | ORAL | Status: DC
  Administered 2023-12-06 – 2023-12-07 (×2): 10 mg via ORAL
  Filled 2023-12-06 (×2): qty 1

## 2023-12-06 MED ORDER — PROCHLORPERAZINE EDISYLATE 10 MG/2ML IJ SOLN
10.0000 mg | Freq: Four times a day (QID) | INTRAMUSCULAR | Status: DC | PRN
  Filled 2023-12-06: qty 2

## 2023-12-06 MED ORDER — ESCITALOPRAM OXALATE 10 MG PO TABS
10.0000 mg | ORAL_TABLET | Freq: Every day | ORAL | Status: DC
  Administered 2023-12-07 – 2023-12-08 (×2): 10 mg via ORAL
  Filled 2023-12-06 (×2): qty 1

## 2023-12-06 MED ORDER — HEPARIN SODIUM (PORCINE) 5000 UNIT/ML IJ SOLN
5000.0000 [IU] | Freq: Three times a day (TID) | INTRAMUSCULAR | Status: DC
  Administered 2023-12-06 – 2023-12-08 (×5): 5000 [IU] via SUBCUTANEOUS
  Filled 2023-12-06 (×5): qty 1

## 2023-12-06 MED ORDER — METHADONE HCL 5 MG PO TABS
7.5000 mg | ORAL_TABLET | ORAL | Status: DC
  Administered 2023-12-07 – 2023-12-08 (×3): 7.5 mg via ORAL
  Filled 2023-12-06 (×3): qty 2

## 2023-12-06 MED ORDER — LORAZEPAM 1 MG PO TABS: 1.0000 mg | ORAL_TABLET | Freq: Four times a day (QID) | ORAL | Status: DC | PRN

## 2023-12-06 MED ORDER — HYDRALAZINE HCL 20 MG/ML IJ SOLN: 5.0000 mg | Freq: Four times a day (QID) | INTRAMUSCULAR | Status: DC | PRN

## 2023-12-06 MED ORDER — GABAPENTIN 300 MG PO CAPS
300.0000 mg | ORAL_CAPSULE | Freq: Two times a day (BID) | ORAL | Status: DC
  Administered 2023-12-06 – 2023-12-08 (×4): 300 mg via ORAL
  Filled 2023-12-06 (×4): qty 1

## 2023-12-06 MED ORDER — OXYCODONE HCL 5 MG PO TABS
5.0000 mg | ORAL_TABLET | ORAL | Status: DC | PRN
  Administered 2023-12-07 – 2023-12-08 (×5): 5 mg via ORAL
  Filled 2023-12-06 (×5): qty 1

## 2023-12-06 MED ORDER — LACTATED RINGERS IV BOLUS (SEPSIS)
1000.0000 mL | Freq: Once | INTRAVENOUS | Status: AC
  Administered 2023-12-06: 1000 mL via INTRAVENOUS

## 2023-12-06 MED ORDER — ACETAMINOPHEN 650 MG RE SUPP: 650.0000 mg | Freq: Four times a day (QID) | RECTAL | Status: DC | PRN

## 2023-12-06 MED ORDER — LACTATED RINGERS IV SOLN: INTRAVENOUS | Status: DC

## 2023-12-06 MED ORDER — METHADONE HCL 10 MG PO TABS: 5.0000 mg | ORAL_TABLET | ORAL | Status: DC

## 2023-12-06 MED ORDER — ASPIRIN 81 MG PO CHEW
81.0000 mg | CHEWABLE_TABLET | Freq: Two times a day (BID) | ORAL | Status: DC
  Administered 2023-12-06: 81 mg via ORAL
  Filled 2023-12-06: qty 1

## 2023-12-06 MED ORDER — LEVOTHYROXINE SODIUM 50 MCG PO TABS
175.0000 ug | ORAL_TABLET | Freq: Every day | ORAL | Status: DC
  Administered 2023-12-07 – 2023-12-08 (×2): 175 ug via ORAL
  Filled 2023-12-06: qty 4
  Filled 2023-12-06: qty 1

## 2023-12-06 NOTE — ED Provider Notes (Signed)
 [facility] Provider Note    Event Date/Time   First MD Initiated Contact with Patient 12/06/23 1218     (approximate)   History   Hypotension   HPI  Brittany Nguyen is a 58 y.o. female PMH multiple medical comorbidities including COPD, atrial fibrillation, seizure-like activity, hypertension, CAD with AICD in place, prior DVT, multiple other conditions presents for evaluation of generalized malaise -To be hypotensive to 70s by EMS.  Complaining of nausea, fatigue, mild headache as well as right knee pain (recently had surgery -Is a limited historian but does tell me that she feels nauseous and a little short of breath.  Also complaining of right knee pain, did recently have right knee surgery (replacement due to osteoarthritis)  On later chart review, appears patient does have an IVC filter in place.  Appears she also has CHF with an EF of around 25%, reviewed today, improved to 45% on echo in 2021.       Physical Exam   Triage Vital Signs: ED Triage Vitals  Encounter Vitals Group     BP 12/06/23 1218 (!) 78/57     Systolic BP Percentile --      Diastolic BP Percentile --      Pulse Rate 12/06/23 1218 67     Resp 12/06/23 1218 (!) 22     Temp --      Temp src --      SpO2 12/06/23 1218 92 %     Weight --      Height 12/06/23 1219 5\' 5"  (1.651 m)     Head Circumference --      Peak Flow --      Pain Score 12/06/23 1218 10     Pain Loc --      Pain Education --      Exclude from Growth Chart --     Most recent vital signs: Vitals:   12/06/23 1400 12/06/23 1430  BP: (!) 87/56 (!) 82/60  Pulse: 60 60  Resp: 12 19  Temp:    SpO2: 93% 99%     General: Awake, drowsy, odd affect CV:  Good peripheral perfusion.  Regular rate and rhythm, RP 2+. Resp:  Normal effort.  CTAB Abd:  No distention.  Nontender to palpation throughout. RLE:   Well-appearing surgical scar to right anterior knee, no gross swelling, no surrounding  erythema    ED Results / Procedures / Treatments   Labs (all labs ordered are listed, but only abnormal results are displayed) Labs Reviewed  LACTIC ACID, PLASMA - Abnormal; Notable for the following components:      Result Value   Lactic Acid, Venous 2.2 (*)    All other components within normal limits  COMPREHENSIVE METABOLIC PANEL WITH GFR - Abnormal; Notable for the following components:   Potassium 3.3 (*)    Chloride 97 (*)    Glucose, Bld 125 (*)    Creatinine, Ser 1.03 (*)    Calcium 8.2 (*)    Albumin 3.3 (*)    All other components within normal limits  CBC WITH DIFFERENTIAL/PLATELET - Abnormal; Notable for the following components:   RBC 3.16 (*)    Hemoglobin 9.0 (*)    HCT 27.8 (*)    RDW 16.2 (*)    Abs Immature Granulocytes 0.09 (*)    All other components within normal limits  CULTURE, BLOOD (ROUTINE X 2)  CULTURE, BLOOD (ROUTINE X 2)  LACTIC ACID, PLASMA  PROTIME-INR  APTT  LIPASE, BLOOD  BRAIN NATRIURETIC PEPTIDE  URINALYSIS, W/ REFLEX TO CULTURE (INFECTION SUSPECTED)  TROPONIN I (HIGH SENSITIVITY)  TROPONIN I (HIGH SENSITIVITY)     EKG  Sinus rhythm, rate 63, no ST elevation, trace ST depression and T wave inversions in precordial leads that is unchanged from prior on comparison.   RADIOLOGY Chest x-ray interpreted by myself, no acute pathology.  Radiology report reviewed.  See ED course for further radiology interpretations.  PROCEDURES:  Critical Care performed: No  .1-3 Lead EKG Interpretation  Performed by: Marinell Blight, MD Authorized by: Marinell Blight, MD     Interpretation: normal     ECG rate:  67   ECG rate assessment: normal     Rhythm: sinus rhythm     Ectopy: none     Conduction: normal      MEDICATIONS ORDERED IN ED: Medications  sodium chloride 0.9 % bolus 500 mL (has no administration in time range)  lactated ringers bolus 1,000 mL (1,000 mLs Intravenous New Bag/Given 12/06/23 1248)     IMPRESSION / MDM /  ASSESSMENT AND PLAN / ED COURSE  I reviewed the triage vital signs and the nursing notes.                              Differential diagnosis includes, but is not limited to, hypovolemia, consider underlying infection though no clear source on initial eval, consider but doubt pulmonary embolism given patient already has IVC filter in place.  No clear arrhythmia on monitor.  No clear history to suggest hemorrhagic etiology.  Patient's presentation is most consistent with acute presentation with potential threat to life or bodily function.   The patient is on the cardiac monitor to evaluate for evidence of arrhythmia and/or significant heart rate changes.  See ED course below.  Workup overall unremarkable, mildly elevated lactate initially though no leukocytosis and no fever with no tachycardia.  Hemoglobin mild drop since presurgical time.  On reassessment, patient complaining of vague abdominal discomfort and mild intermittent shortness of breath.  Blood pressure improved, largely systolics of 80-90, MAP is consistently above 65--deferred more aggressive interventions including central line placement and pressors.  Already fluids well, received 1 L bolus on arrival, 0.5 L ordered in addition.  Patient signed out to oncoming ED provider pending CT chest abdomen pelvis results.  Further contrast given history of severe allergic reactions despite aggressive pretreatment.  Anticipate need for admission given relative hypotension of unclear etiology.  Antibiotics deferred at this time given no clear source of infection and otherwise not meeting sepsis criteria.  Clinical Course as of 12/06/23 1608  Wed Dec 06, 2023  1319 CBC with no leukocytosis, hemoglobin with about 2 point drop from 2 days ago [MM]  1327 Chest x-ray with cardiomegaly, overall stable on my interpretation from prior [MM]  1332 Trop wnl [MM]  1333 Bnp wnl [MM]  1442 Patient reevaluated, blood pressure remains soft but stable, MAP  still greater than 65  CT results pending [MM]  1502 Lactate downtrending on rpt [MM]  1553 CT head unremarkable my read, radiology read below  IMPRESSION: 1. No acute intracranial process. 2. Small air-fluid level in the left sphenoid sinus. Correlate for acute sinusitis.   [MM]  1558 CT abdomen pelvis and CT chest reviewed, no obvious pathology on my interpretation  Medicine consult placed [MM]    Clinical Course User Index [MM] Marinell Blight, MD  FINAL CLINICAL IMPRESSION(S) / ED DIAGNOSES   Final diagnoses:  Hypotension, unspecified hypotension type  Shortness of breath  Nausea     Rx / DC Orders   ED Discharge Orders     None        Note:  This document was prepared using Dragon voice recognition software and may include unintentional dictation errors.   Marinell Blight, MD 12/06/23 [phone]

## 2023-12-06 NOTE — Assessment & Plan Note (Signed)
 Baseline hemoglobin ranges 9.9-12.2 Continue to monitor patient hemoglobin is 9.0 on admission, likely a component of recent knee surgery with blood loss No indication for transfusion at this time

## 2023-12-06 NOTE — Assessment & Plan Note (Addendum)
 Home Vimpat 200 mg p.o. twice daily resumed Lorazepam 1 mg IV every 6 hours as needed for seizure, 3 doses ordered, with instructions to administer as appropriate and then let provider know

## 2023-12-06 NOTE — Assessment & Plan Note (Signed)
 CPAP nightly ordered

## 2023-12-06 NOTE — ED Notes (Signed)
 Patient ambulated with a walker and this RN as a standby assist to the restroom at this time.

## 2023-12-06 NOTE — H&P (Addendum)
 History and Physical   Brittany Nguyen ZOX:[id number] DOB: [birth year] DOA: 12/06/2023  PCP: Brittany Mina, MD  Outpatient Specialists: Dr. Ernest Pine, orthopedic specialist Patient coming from: Home  I have personally briefly reviewed patient's old medical records in [facility] EMR.  Chief Concern: Dizziness and weakness  HPI: Brittany Nguyen is a 58 year old female with history of hypothyroid, anxiety, hypertension, restless leg syndrome, atrial fibrillation, history of DVT status post IVC filter placement, CAD status post AICD, seizure, hypertension, COPD, anxiety, who presents to the emergency department for chief concerns of dizziness and weakness for 2 days.  Patient reports that her blood pressure was low and this was concerning to her.   Vitals in the ED showed temperature 98, respiration rate of 18, heart rate of 59, blood pressure initially 78/58 currently 92/65 with a MAP of 74.  SpO2 100% on room air.  Serum sodium is 138, potassium 3.3, chloride 97, bicarb 31, BUN of 10, serum creatinine 1.03, EGFR greater than 60, nonfasting blood glucose 125, WBC 8.7, hemoglobin 9.0, platelets of 273.  Lactic acid was initially 2.2 and on repeat was 1.9.  Blood cultures x 2 are in process.  BNP is 12.3.  High sensitive troponin is 3 and on repeat is less than 2.  ED treatment: LR 1 L bolus, sodium chloride 500 mL liter bolus. ------------------------------------- At bedside, patient was able to tell me her first and last name, age, location, current calendar year.  She reports that she has been dizzy and weak for about 2 days and it got worse today.  She was concerned because she had low blood pressure this morning.  She reports she had knee surgery on Friday and since then has not been eating and drinking as she normally does. She reports that she does not like to drink water and just drinks lemonade or ginger ale.  She reports that over the last 2 days, she has been having dizziness  and weakness especially from laying down to sitting and from sitting to standing.  Bedside currently, she reports she feels significantly better after getting the IV fluids.  She denies dysuria, hematuria, diarrhea. Initially she tells me she is not on any high blood pressure medicine however during med reconciliation, she endorses that she remembers now that she is on 3 blood pressure medicine and she last took them this morning.  She denies trauma to her person, chest pain, shortness of breath, nausea, vomiting, blood in her stool.  Social history: She lives at home with her husband.  She denies tobacco, EtOH, recreational drug use.  ROS: Constitutional: no weight change, no fever ENT/Mouth: no sore throat, no rhinorrhea Eyes: no eye pain, no vision changes Cardiovascular: no chest pain, no dyspnea,  no edema, no palpitations Respiratory: no cough, no sputum, no wheezing Gastrointestinal: no nausea, no vomiting, no diarrhea, no constipation Genitourinary: no urinary incontinence, no dysuria, no hematuria Musculoskeletal: no arthralgias, no myalgias Skin: no skin lesions, no pruritus, Neuro: + weakness, no loss of consciousness, no syncope Psych: no anxiety, no depression, + decrease appetite Heme/Lymph: no bruising, no bleeding  ED Course: With EDP, patient requiring hospitalization for chief concerns of hypotension.  Assessment/Plan  Principal Problem:   Hypotension Active Problems:   OSA on CPAP   Type 2 diabetes mellitus without complication (HCC)   Hypothyroidism   HTN (hypertension)   Normocytic anemia   GERD   RLS (restless legs syndrome)   Non-ischemic cardiomyopathy (HCC)   S/P implantation of automatic cardioverter/defibrillator (  AICD)   Neuropathy   Anxiety   Seizures (HCC)   Neuroendocrine carcinoma (HCC)   Seizure-like activity (HCC)   Assessment and Plan:  * Hypotension Suspect secondary to lack of p.o. intake in setting of right knee surgery on  Friday, 12/01/2023 and baseline sedating medications and current use of hypotensive medication Patient feels significantly better after bolus fluid Status post LR 1.5 L bolus per EDP Continue with LR 125 mL/h, 1 day ordered  OSA on CPAP CPAP nightly ordered  Hypothyroidism Home levothyroxine 175 mcg daily resumed  HTN (hypertension) Home losartan 12.5 mg every morning and evening, metoprolol succinate 50 mg p.o. twice daily, torsemide 40 mg p.o. twice daily would not resumed on admission AM team resume when the benefits outweigh the risk Hydralazine 5 mg IV every 6 hours as needed for SBP greater 170, 40s ordered  Normocytic anemia Baseline hemoglobin ranges 9.9-12.2 Continue to monitor patient hemoglobin is 9.0 on admission, likely a component of recent knee surgery with blood loss No indication for transfusion at this time  Seizures (HCC) Home Vimpat 200 mg p.o. twice daily resumed Lorazepam 1 mg IV every 6 hours as needed for seizure, 3 doses ordered, with instructions to administer as appropriate and then let provider know  Neuropathy Home gabapentin 300 mg 3 times daily  RLS (restless legs syndrome) Home ropinirole 4 mg nightly resumed  Chart reviewed.   DVT prophylaxis: Heparin 5000 units subcutaneous every 8 hours Code Status: Full code Diet: Heart healthy Family Communication: Updated spouse at bedside with patient's permission Disposition Plan: Pending clinical course, anticipate discharge in the a.m. Consults called: None at this time Admission status: Telemetry medical, observation  Past Medical History:  Diagnosis Date   Acute anterolateral wall MI (HCC) 08/2016   Acute respiratory failure (HCC)    AICD (automatic cardioverter/defibrillator) present 03/29/2017   a.) Medtronic Evera MRI XT DR SureScan (SN: [id number])   Anxiety    a.) on BZO PRN (lorazepam)   Arthritis    Asthma 2013   Breast cancer, right (HCC)    C. 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   a.) Bx 02/22/2018 --> NE tumor cells --> AE1/AE3, chromogranin, synaptophysin (+); Ki-67 proliferation rate <1%; b.) repeat Bx 08/24/2018 --> well differentiard NET (G1)   CHF (congestive heart failure) (HCC)    Chronic, continuous use of opioids    a.) has naloxone Rx available   COPD (chronic obstructive pulmonary disease) (HCC)    Coronary artery disease    Depression    Diet-controlled type 2 diabetes mellitus (HCC)    Diverticulitis 2010   DVT (deep venous thrombosis) (HCC)    Endometriosis 1990   Generalized epilepsy (HCC)    a.) on lacosamide; b.) seizure 07-30-22 in setting of hypokalemia (K+ 2.6); c.) last seizure 11/13/2023 during blood draw   GERD (gastroesophageal reflux disease)    GIB (gastrointestinal bleeding) 01/08/2018   H/O syncope    HFrEF (heart failure with reduced ejection fraction) (HCC)    a.)TTE 12/09/16: EF 25-30%, dif HK; b.) TTE 02/08/17: EF 25%, dif HK, mild TR, G1DD; c.) TTE 04/25/17: EF 30-35%, mild LVH; d.) TTE 01/30/18: EF 45%, MAC, LAE, mild MR, G1DD; e.) TTE 11/06/2018: EF 25%, sev glob HK, triv MR/TR/PR; f.) TTE 05/12/19: EF 35%, MAC, AoV sclerosis, G1DD; g.) TTE 11/05/19: EF 20%, sev glob HK, triv MR; h.) TTE 06/17/20: EF 40%, mod glob HK, triv MR; I:) TTE 06/14/22: EF 25-30%, G1DD  History of cardiac catheterization    a.) LHC 10/21/2014: normal coronaries; b.) R/LHC 02/24/2017: normal coronaries, mRA 12, mPA 28, mPCWP 15, CO 8.0, CI 3.8   History of kidney stones    Hx MRSA infection    Hypertension    Hypokalemia    Hypothyroidism    a.) s/p thyroidectomy   Incomplete left bundle branch block (LBBB)    Iron deficiency anemia 03/27/2018   Lump or mass in breast 11/27/2012   a.) Bx 11/27/2012 --> apocrine wall cyst   Migraine    Nausea & vomiting    Neuropathy    Non-ischemic cardiomyopathy (HCC)    a.) TTE 12/09/2016: EF 25-30%, b.) TTE 02/08/2017: EF 25%; c.) Medtronic AICD placed 03/29/2017;  d.) TTE 04/25/2017: EF 30-35%; e.) TTE 01/30/2018: EF 45%; f.) TTE 11/06/2018: EF 25%; g.) TTE 05/12/2019: EF 35%; h.) TTE 11/05/2019: EF 20%; I.) TTE 06/17/2020: EF 40%; J.) TTE 06/14/2022: EF 25-30%   Obesity    OSA on CPAP    PAF (paroxysmal atrial fibrillation) (HCC)    Pneumonia    PONV (postoperative nausea and vomiting)    Post-COVID chronic cough    Postcholecystectomy diarrhea    Prolonged Q-T interval on ECG    RAD (reactive airway disease)    Restless leg    a.) on ropinirole   Sepsis (HCC)    Unstable angina (HCC)    Past Surgical History:  Procedure Laterality Date   ABDOMINAL HYSTERECTOMY     age 66   BREAST BIOPSY Right 2014   benign   BREAST BIOPSY Right 01/18/2023   [place] Core Bx, coil clip - path pending   BREAST BIOPSY Right 01/18/2023   [place] RT BREAST BX W LOC DEV 1ST LESION IMG BX SPEC US GUIDE 01/18/2023 [facility]   CARDIAC DEFIBRILLATOR PLACEMENT Left 03/29/2017   Procedure: MEDTRONIC CARDIAC DEFIBRILLATOR PLACEMENT (AICD); Location: [facility]; Surgeon: Bonne Dolores, MD   CESAREAN SECTION     CHOLECYSTECTOMY     COLECTOMY     COLONOSCOPY WITH PROPOFOL N/A 02/22/2018   Procedure: COLONOSCOPY WITH PROPOFOL;  Surgeon: Toledo, Boykin Nearing, MD;  Location: [facility];  Service: Gastroenterology;  Laterality: N/A;   ESOPHAGOGASTRODUODENOSCOPY (EGD) WITH PROPOFOL N/A 02/22/2018   Procedure: ESOPHAGOGASTRODUODENOSCOPY (EGD) WITH PROPOFOL;  Surgeon: Toledo, Boykin Nearing, MD;  Location: [facility];  Service: Gastroenterology;  Laterality: N/A;   IVC FILTER INSERTION N/A 08/23/2022   Procedure: IVC FILTER INSERTION;  Surgeon: Renford Dills, MD;  Location: [facility];  Service: Cardiovascular;  Laterality: N/A;   IVC FILTER REMOVAL N/A 03/07/2023   Procedure: IVC FILTER REMOVAL;  Surgeon: Renford Dills, MD;  Location: [facility];  Service: Cardiovascular;  Laterality: N/A;   KNEE ARTHROPLASTY Left 08/24/2022   Procedure: COMPUTER ASSISTED  TOTAL KNEE ARTHROPLASTY;  Surgeon: Donato Heinz, MD;  Location: [facility];  Service: Orthopedics;  Laterality: Left;   KNEE ARTHROPLASTY Right 12/01/2023   Procedure: COMPUTER ASSISTED TOTAL KNEE ARTHROPLASTY;  Surgeon: Donato Heinz, MD;  Location: [facility];  Service: Orthopedics;  Laterality: Right;   LEFT HEART CATH AND CORONARY ANGIOGRAPHY Left 10/21/2014   Procedure: LEFT HEART CATH AND CORONARY ANGIOGRAPHY; Location: [facility]; Surgeon: Arnoldo Hooker, MD   NASAL SINUS SURGERY  2012   OOPHORECTOMY     RIGHT/LEFT HEART CATH AND CORONARY ANGIOGRAPHY Bilateral 02/24/2017   Procedure: RIGHT/LEFT HEART CATH AND CORONARY ANGIOGRAPHY; Location: [facility]   THYROIDECTOMY     Social History:  reports that she has never  smoked. She has never used smokeless tobacco. She reports that she does not drink alcohol and does not use drugs.  Allergies  Allergen Reactions   Contrast Media [Iodinated Contrast Media] Shortness Of Breath    Per CT report in 2014 pt had break through contrast reaction with hives and SOB following 13 hour prep. MSY    Ferumoxytol Nausea Only   Iron Sucrose Anaphylaxis   Lidocaine Hives   Metrizamide Shortness Of Breath   Penicillins Hives and Other (See Comments)    IgE = 4 (WNL) on 08/12/2022  ediate rash, facial/tongue/throat swelling, SOB or lightheadedness with hypotension, tachy   Sacubitril-Valsartan     Sz (unclear if new start entresto vs hypoglycemia)   Isosorbide Nitrate Other (See Comments)    Headache   Latex Hives    IgE < 0.10 (WNL) on 08/21/2023   Ondansetron Other (See Comments)    Severe headache   Tizanidine Other (See Comments)    Feels altered   Povidone-Iodine Rash    Blistering rash   Pulmicort [Budesonide] Itching   Family History  Problem Relation Age of Onset   Breast cancer Mother    Cancer Mother 3       ovarian   CAD Mother    Cancer Father 17       brain   CAD Father    Cancer Daughter 59       skin   Breast cancer Maternal Aunt     Cancer Maternal Aunt 30       breast   Leukemia Paternal Grandfather    Breast cancer Cousin    Family history: Family history reviewed and not pertinent.  Prior to Admission medications   Medication Sig Start Date End Date Taking? Authorizing Provider  aspirin 81 MG chewable tablet Chew 1 tablet (81 mg total) by mouth 2 (two) times daily. 12/02/23   Anson Oregon, PA-C  celecoxib (CELEBREX) 200 MG capsule Take 1 capsule (200 mg total) by mouth 2 (two) times daily. 12/02/23   Anson Oregon, PA-C  chlorhexidine (HIBICLENS) 4 % external liquid Apply 15 mLs (1 Application total) topically as directed for 30 doses. Use as directed daily for 5 days every other week for 6 weeks. 12/02/23   Anson Oregon, PA-C  escitalopram (LEXAPRO) 10 MG tablet Take 10 mg by mouth daily. 11/24/22   [provider]  gabapentin (NEURONTIN) 300 MG capsule Take 1 capsule (300 mg total) by mouth 3 (three) times daily. 10/08/23   Briant Cedar, MD  lacosamide (VIMPAT) 200 MG TABS tablet Take 1 tablet (200 mg total) by mouth 2 (two) times daily. 09/27/23 12/26/23  [place], Eric J, DO  levothyroxine (SYNTHROID) 175 MCG tablet Take 1 tablet (175 mcg total) by mouth daily at 6 (six) AM. 09/28/23 12/27/23  [place], Alvira Philips, DO  LORazepam (ATIVAN) 1 MG tablet Take 1 tablet (1 mg total) by mouth 4 (four) times daily as needed. 11/10/21   Arnetha Courser, MD  losartan (COZAAR) 25 MG tablet Take 12.5 mg by mouth in the morning and at bedtime.    [provider]  Menthol-Methyl Salicylate (MUSCLE RUB) 10-15 % CREA Apply 1 Application topically as needed. Patient not taking: Reported on 12/01/2023 11/19/23   Pilar Jarvis, MD  methadone (DOLOPHINE) 5 MG tablet Take 1.5 tablets (7.5 mg total) by mouth 2 (two) times daily at 10 am and 4 pm. Patient not taking: Reported on 12/01/2023 09/23/23   Delfino Lovett, MD  methadone (  DOLOPHINE) 5 MG tablet Take 5 mg by mouth See admin instructions. Patient takes 1.5 tabs  (7.5 mg) every morning, and 1.5 tabs (7.5 mg) midday,  and 2 tabs (10 mg) at bedtime.    [provider]  metoprolol succinate (TOPROL-XL) 50 MG 24 hr tablet Take 50 mg by mouth 2 (two) times daily. Take with or immediately following a meal.    [provider]  mupirocin ointment (BACTROBAN) 2 % Place 1 Application into the nose 2 (two) times daily for 60 doses. Use as directed 2 times daily for 5 days every other week for 6 weeks. 12/02/23 01/01/24  Anson Oregon, PA-C  oxyCODONE-acetaminophen (PERCOCET) 10-325 MG tablet Take 1 tablet by mouth every 4 (four) hours as needed for pain.    [provider]  potassium chloride SA (KLOR-CON M) 20 MEQ tablet Take 20 mEq by mouth 2 (two) times daily.    [provider]  promethazine (PHENERGAN) 25 MG tablet Take 25 mg by mouth every 4 (four) hours as needed for nausea or vomiting.    [provider]  rOPINIRole (REQUIP) 4 MG tablet Take 4 mg by mouth at bedtime. 01/30/19   [provider]  torsemide (DEMADEX) 20 MG tablet Take 40 mg by mouth 2 (two) times daily.    [provider]   Physical Exam: Vitals:   12/06/23 1730 12/06/23 1800 12/06/23 1820 12/06/23 1830  BP: (!) 100/56 96/67 101/87   Pulse: 64 61 64   Resp: 12 (!) 9 14   Temp:    97.9 F (36.6 C)  TempSrc:    Oral  SpO2: 100% 100% 100%   Height:       Constitutional: appears older than chronological age, appears chronically ill, NAD , calm Eyes: PERRL, lids and conjunctivae normal ENMT: Mucous membranes are moist. Posterior pharynx clear of any exudate or lesions. Age-appropriate dentition. Hearing appropriate Neck: normal, supple, no masses, no thyromegaly Respiratory: clear to auscultation bilaterally, no wheezing, no crackles. Normal respiratory effort. No accessory muscle use.  Cardiovascular: Regular rate and rhythm, no murmurs / rubs / gallops. No extremity edema. 2+ pedal pulses. No carotid bruits.  Abdomen: Obese  abdomen, no tenderness, no masses palpated, no hepatosplenomegaly. Bowel sounds positive.  Musculoskeletal: no clubbing / cyanosis. No joint deformity upper and lower extremities. Good ROM, no contractures, no atrophy. Normal muscle tone.  Skin: no rashes, lesions, ulcers. No induration Neurologic: Sensation intact. Strength 5/5 in all 4.  Psychiatric: Normal judgment and insight. Alert and oriented x 3. Normal mood.   EKG: independently reviewed, showing sinus rhythm with rate of 63, QTc 487  Chest x-ray on Admission: I personally reviewed and I agree with radiologist reading as below.  CT CHEST WO CONTRAST Result Date: 12/06/2023 CLINICAL DATA:  Hypotension of unclear source. Recent right knee surgery. EXAM: CT CHEST, ABDOMEN AND PELVIS WITHOUT CONTRAST TECHNIQUE: Multidetector CT imaging of the chest, abdomen and pelvis was performed following the standard protocol without IV contrast. RADIATION DOSE REDUCTION: This exam was performed according to the departmental dose-optimization program which includes automated exposure control, adjustment of the mA and/or kV according to patient size and/or use of iterative reconstruction technique. COMPARISON:  Chest CT 03/08/2023.  CT abdomen and pelvis 12/15/2021. FINDINGS: CT CHEST FINDINGS Cardiovascular: The heart is mildly enlarged. There is no pericardial effusion. Left-sided pacemaker is present. Aorta is normal in size. Mediastinum/Nodes: No enlarged mediastinal or axillary lymph nodes. Trachea and esophagus are within normal limits. There surgical  clips in the region of the thyroid. Lungs/Pleura: There is a 3 mm nodule in the right lung base image 4/105 which is unchanged from 2023. The lungs are otherwise clear. No pleural effusion or pneumothorax. Musculoskeletal: There subacute appearing right anterolateral fourth through seventh rib fractures. CT ABDOMEN PELVIS FINDINGS Hepatobiliary: No focal liver abnormality is seen. Status post cholecystectomy. No  biliary dilatation. Pancreas: Unremarkable. No pancreatic ductal dilatation or surrounding inflammatory changes. Spleen: Normal in size without focal abnormality. Adrenals/Urinary Tract: Adrenal glands are unremarkable. Kidneys are normal, without renal calculi, focal lesion, or hydronephrosis. Bladder is unremarkable. Stomach/Bowel: Sigmoid colon anastomosis present. Scattered colonic diverticula present. Vascular/Lymphatic: IVC filter in place inferior to the level of the renal veins. Aorta and IVC are normal in size. No enlarged lymph nodes are identified. Reproductive: Status post hysterectomy. No adnexal masses. Other: No abdominal wall hernia or abnormality. No abdominopelvic ascites. Musculoskeletal: No acute or significant osseous findings. IMPRESSION: 1. Subacute appearing right anterolateral fourth through seventh rib fractures. 2. No acute localizing process in the abdomen or pelvis. 3. Colonic diverticulosis. 4. IVC filter in place. 5. Stable 3 mm nodule in the right lung base. No follow-up needed if patient is low-risk.This recommendation follows the consensus statement: Guidelines for Management of Incidental Pulmonary Nodules Detected on CT Images: From the Fleischner Society 2017; Radiology 2017; 284:228-243. Electronically Signed   By: Darliss Cheney M.D.   On: 12/06/2023 16:03   CT ABDOMEN PELVIS WO CONTRAST Result Date: 12/06/2023 CLINICAL DATA:  Hypotension of unclear source. Recent right knee surgery. EXAM: CT CHEST, ABDOMEN AND PELVIS WITHOUT CONTRAST TECHNIQUE: Multidetector CT imaging of the chest, abdomen and pelvis was performed following the standard protocol without IV contrast. RADIATION DOSE REDUCTION: This exam was performed according to the departmental dose-optimization program which includes automated exposure control, adjustment of the mA and/or kV according to patient size and/or use of iterative reconstruction technique. COMPARISON:  Chest CT 03/08/2023.  CT abdomen and pelvis  12/15/2021. FINDINGS: CT CHEST FINDINGS Cardiovascular: The heart is mildly enlarged. There is no pericardial effusion. Left-sided pacemaker is present. Aorta is normal in size. Mediastinum/Nodes: No enlarged mediastinal or axillary lymph nodes. Trachea and esophagus are within normal limits. There surgical clips in the region of the thyroid. Lungs/Pleura: There is a 3 mm nodule in the right lung base image 4/105 which is unchanged from 2023. The lungs are otherwise clear. No pleural effusion or pneumothorax. Musculoskeletal: There subacute appearing right anterolateral fourth through seventh rib fractures. CT ABDOMEN PELVIS FINDINGS Hepatobiliary: No focal liver abnormality is seen. Status post cholecystectomy. No biliary dilatation. Pancreas: Unremarkable. No pancreatic ductal dilatation or surrounding inflammatory changes. Spleen: Normal in size without focal abnormality. Adrenals/Urinary Tract: Adrenal glands are unremarkable. Kidneys are normal, without renal calculi, focal lesion, or hydronephrosis. Bladder is unremarkable. Stomach/Bowel: Sigmoid colon anastomosis present. Scattered colonic diverticula present. Vascular/Lymphatic: IVC filter in place inferior to the level of the renal veins. Aorta and IVC are normal in size. No enlarged lymph nodes are identified. Reproductive: Status post hysterectomy. No adnexal masses. Other: No abdominal wall hernia or abnormality. No abdominopelvic ascites. Musculoskeletal: No acute or significant osseous findings. IMPRESSION: 1. Subacute appearing right anterolateral fourth through seventh rib fractures. 2. No acute localizing process in the abdomen or pelvis. 3. Colonic diverticulosis. 4. IVC filter in place. 5. Stable 3 mm nodule in the right lung base. No follow-up needed if patient is low-risk.This recommendation follows the consensus statement: Guidelines for Management of Incidental Pulmonary Nodules Detected on CT Images:  From the Fleischner Society 2017;  Radiology 2017; [phone]. Electronically Signed   By: Darliss Cheney M.D.   On: 12/06/2023 16:03   CT HEAD WO CONTRAST ( ) Result Date: 12/06/2023 CLINICAL DATA:  Altered mental status. EXAM: CT HEAD WITHOUT CONTRAST TECHNIQUE: Contiguous axial images were obtained from the base of the skull through the vertex without intravenous contrast. RADIATION DOSE REDUCTION: This exam was performed according to the departmental dose-optimization program which includes automated exposure control, adjustment of the mA and/or kV according to patient size and/or use of iterative reconstruction technique. COMPARISON:  Head CT 10/06/2023.  MRI brain 09/25/2023. FINDINGS: Brain: No evidence of acute infarction, hemorrhage, hydrocephalus, extra-axial collection or mass lesion/mass effect. Vascular: No hyperdense vessel or unexpected calcification. Skull: Normal. Negative for fracture or focal lesion. Sinuses/Orbits: There is a small air-fluid level in the left sphenoid sinus. Patient is status post sinonasal surgery bilaterally. Orbits are within normal limits. Other: None. IMPRESSION: 1. No acute intracranial process. 2. Small air-fluid level in the left sphenoid sinus. Correlate for acute sinusitis. Electronically Signed   By: Darliss Cheney M.D.   On: 12/06/2023 15:49   DG Chest Port 1 View Result Date: 12/06/2023 CLINICAL DATA:  Concern for sepsis. EXAM: PORTABLE CHEST 1 VIEW COMPARISON:  Chest radiograph dated 12/20/2023. FINDINGS: Low lung volumes with associated accentuation of the cardiac silhouette. Stable left subclavian dual lead cardiac device in place. No focal consolidation, sizeable pleural effusion, or pneumothorax. No acute osseous abnormality. IMPRESSION: Low lung volumes.  No acute cardiopulmonary findings. Electronically Signed   By: Hart Robinsons M.D.   On: 12/06/2023 14:13   Labs on Admission: I have personally reviewed following labs  CBC: Recent Labs  Lab 12/06/23 1243  WBC 8.7  NEUTROABS  6.0  HGB 9.0*  HCT 27.8*  MCV 88.0  PLT 273   Basic Metabolic Panel: Recent Labs  Lab 12/06/23 1243  NA 138  K 3.3*  CL 97*  CO2 31  GLUCOSE 125*  BUN 10  CREATININE 1.03*  CALCIUM 8.2*   GFR: Estimated Creatinine Clearance: 68.7 mL/min (A) (by C-G formula based on SCr of 1.03 mg/dL (H)).  Liver Function Tests: Recent Labs  Lab 12/06/23 1243  AST 23  ALT 13  ALKPHOS 82  BILITOT 0.6  PROT 6.7  ALBUMIN 3.3*   Recent Labs  Lab 12/06/23 1243  LIPASE 26   Coagulation Profile: Recent Labs  Lab 12/06/23 1243  INR 1.1   CBG: Recent Labs  Lab 12/01/23 1412 12/01/23 1649 12/01/23 2212 12/02/23 0812 12/02/23 1121  GLUCAP 163* 179* 233* 119* 145*   Urine analysis:    Component Value Date/Time   COLORURINE YELLOW (A) 11/22/2023 1457   APPEARANCEUR HAZY (A) 11/22/2023 1457   APPEARANCEUR Clear 09/02/2014 2312   LABSPEC 1.021 11/22/2023 1457   LABSPEC 1.013 09/02/2014 2312   PHURINE 5.0 11/22/2023 1457   GLUCOSEU NEGATIVE 11/22/2023 1457   GLUCOSEU Negative 09/02/2014 2312   HGBUR NEGATIVE 11/22/2023 1457   BILIRUBINUR NEGATIVE 11/22/2023 1457   BILIRUBINUR Negative 09/02/2014 2312   KETONESUR NEGATIVE 11/22/2023 1457   PROTEINUR NEGATIVE 11/22/2023 1457   NITRITE NEGATIVE 11/22/2023 1457   LEUKOCYTESUR SMALL (A) 11/22/2023 1457   LEUKOCYTESUR Negative 09/02/2014 2312   This document was prepared using Dragon Voice Recognition software and may include unintentional dictation errors.  Dr. Sedalia Muta [facility]  If 7PM-7AM, please contact overnight-coverage provider If 7AM-7PM, please contact day attending provider [email or web address]  12/06/2023, 7:03 PM

## 2023-12-06 NOTE — Assessment & Plan Note (Signed)
 Home ropinirole 4 mg nightly resumed

## 2023-12-06 NOTE — Assessment & Plan Note (Signed)
 Home gabapentin 300 mg 3 times daily

## 2023-12-06 NOTE — ED Notes (Signed)
Labs and blood cultures drawn at this time.

## 2023-12-06 NOTE — ED Notes (Signed)
 Pt in CT. Will get blood work when she is back.

## 2023-12-06 NOTE — ED Triage Notes (Signed)
 Pt in via [facility] from home. Per EMS pt was hypotensive at 70/40's + AMS. Pt lethargic upon arrival. Pressure 70/50's with 100cc of LR going in from EMS.

## 2023-12-06 NOTE — Assessment & Plan Note (Signed)
 Home levothyroxine 175 mcg daily resumed

## 2023-12-06 NOTE — Assessment & Plan Note (Signed)
 Home losartan 12.5 mg every morning and evening, metoprolol succinate 50 mg p.o. twice daily, torsemide 40 mg p.o. twice daily would not resumed on admission AM team resume when the benefits outweigh the risk Hydralazine 5 mg IV every 6 hours as needed for SBP greater 170, 40s ordered

## 2023-12-06 NOTE — Assessment & Plan Note (Addendum)
 Suspect secondary to lack of p.o. intake in setting of right knee surgery on Friday, 12/01/2023 and baseline sedating medications and current use of hypotensive medication Patient feels significantly better after bolus fluid Status post LR 1.5 L bolus per EDP Continue with LR 125 mL/h, 1 day ordered

## 2023-12-06 NOTE — Hospital Course (Addendum)
 Ms. Brittany Nguyen is a 58 year old female with history of hypothyroid, anxiety, hypertension, restless leg syndrome, atrial fibrillation, history of DVT status post IVC filter placement, CAD status post AICD, seizure, hypertension, COPD, anxiety, who presents to the emergency department for chief concerns of dizziness and weakness for 2 days.  Patient reports that her blood pressure was low and this was concerning to her.   Vitals in the ED showed temperature 98, respiration rate of 18, heart rate of 59, blood pressure initially 78/58 currently 92/65 with a MAP of 74.  SpO2 100% on room air.  Serum sodium is 138, potassium 3.3, chloride 97, bicarb 31, BUN of 10, serum creatinine 1.03, EGFR greater than 60, nonfasting blood glucose 125, WBC 8.7, hemoglobin 9.0, platelets of 273.  Lactic acid was initially 2.2 and on repeat was 1.9.  Blood cultures x 2 are in process.  BNP is 12.3.  High sensitive troponin is 3 and on repeat is less than 2.  ED treatment: LR 1 L bolus, sodium chloride 500 mL liter bolus.

## 2023-12-07 ENCOUNTER — Encounter: Payer: Medicare HMO | Admitting: Occupational Therapy

## 2023-12-07 DIAGNOSIS — D508 Other iron deficiency anemias: Secondary | ICD-10-CM | POA: Diagnosis not present

## 2023-12-07 DIAGNOSIS — I952 Hypotension due to drugs: Secondary | ICD-10-CM

## 2023-12-07 DIAGNOSIS — I959 Hypotension, unspecified: Secondary | ICD-10-CM | POA: Diagnosis not present

## 2023-12-07 DIAGNOSIS — G893 Neoplasm related pain (acute) (chronic): Secondary | ICD-10-CM | POA: Diagnosis not present

## 2023-12-07 DIAGNOSIS — G894 Chronic pain syndrome: Secondary | ICD-10-CM | POA: Diagnosis not present

## 2023-12-07 LAB — CBC
HCT: 24.5 % — ABNORMAL LOW (ref 36.0–46.0)
Hemoglobin: 8.1 g/dL — ABNORMAL LOW (ref 12.0–15.0)
MCH: 29.1 pg (ref 26.0–34.0)
MCHC: 33.1 g/dL (ref 30.0–36.0)
MCV: 88.1 fL (ref 80.0–100.0)
Platelets: 223 10*3/uL (ref 150–400)
RBC: 2.78 MIL/uL — ABNORMAL LOW (ref 3.87–5.11)
RDW: 16.2 % — ABNORMAL HIGH (ref 11.5–15.5)
WBC: 7.4 10*3/uL (ref 4.0–10.5)
nRBC: 0 % (ref 0.0–0.2)

## 2023-12-07 LAB — BASIC METABOLIC PANEL WITH GFR
Anion gap: 9 (ref 5–15)
BUN: 12 mg/dL (ref 6–20)
CO2: 31 mmol/L (ref 22–32)
Calcium: 7.9 mg/dL — ABNORMAL LOW (ref 8.9–10.3)
Chloride: 97 mmol/L — ABNORMAL LOW (ref 98–111)
Creatinine, Ser: 1.14 mg/dL — ABNORMAL HIGH (ref 0.44–1.00)
GFR, Estimated: 57 mL/min — ABNORMAL LOW (ref >60)
Glucose, Bld: 124 mg/dL — ABNORMAL HIGH (ref 70–99)
Potassium: 3.3 mmol/L — ABNORMAL LOW (ref 3.5–5.1)
Sodium: 137 mmol/L (ref 135–145)

## 2023-12-07 MED ORDER — ASPIRIN 81 MG PO TBEC: 81.0000 mg | DELAYED_RELEASE_TABLET | Freq: Every day | ORAL | Status: DC

## 2023-12-07 MED ORDER — LORAZEPAM 1 MG PO TABS
1.0000 mg | ORAL_TABLET | Freq: Four times a day (QID) | ORAL | Status: DC | PRN
  Administered 2023-12-07: 1 mg via ORAL
  Filled 2023-12-07 (×2): qty 1

## 2023-12-07 NOTE — Progress Notes (Addendum)
 PROGRESS NOTE  Brittany Nguyen    DOB: 05-26-1966, 58 y.o.  WUJ:[id number]    Code Status: Full Code   DOA: 12/06/2023   LOS: 0   Brief hospital course  Ms. Brittany Nguyen is a 58 year old female with history of hypothyroid, anxiety, hypertension, restless leg syndrome, atrial fibrillation, history of DVT status post IVC filter placement, CAD status post AICD, seizure, hypertension, COPD, anxiety, who presents to the emergency department for chief concerns of dizziness and weakness for 2 das associated with low BP reading at home. Vitals in the ED showed temperature 98, respiration rate of 18, heart rate of 59, blood pressure initially 78/58 currently 92/65 with a MAP of 74. SpO2 100% on room air.  ED treatment: LR 1 L bolus, sodium chloride 500 mL liter bolus.  Patient was admitted to medicine service for further workup and management of hypotension as outlined in detail below.  12/07/23 -hypovolemia s/p knee surgery, poor PO intake- improving.   Assessment & Plan  Principal Problem:   Hypotension Active Problems:   OSA on CPAP   Type 2 diabetes mellitus without complication (HCC)   Hypothyroidism   HTN (hypertension)   Normocytic anemia   GERD   RLS (restless legs syndrome)   Non-ischemic cardiomyopathy (HCC)   S/P implantation of automatic cardioverter/defibrillator (AICD)   Neuropathy   Anxiety   Seizures (HCC)   Neuroendocrine carcinoma (HCC)   Seizure-like activity (HCC)  Hypotension Suspect secondary to lack of p.o. intake in setting of right knee surgery on Friday, 12/01/2023 and baseline sedating medications and current use of hypotensive medication. No signs of hematoma at knee or signs of bleeding - continue to monitor and hold home Bps.  - stopping fluids to observe BP without. Tolerating PO   OSA on CPAP CPAP nightly ordered  Rib fractures- due to receiving CPR at prior hospitalizations - incentive spirometer to avoid atelectasis  - analgesia PRN    Hypothyroidism- - checking TSH if contributing Home levothyroxine 175 mcg daily resumed  Recent R knee arthroplasty- healing well - analgesia PRN - PT   HTN- Home losartan 12.5 mg every morning and evening, metoprolol succinate 50 mg p.o. twice daily, torsemide 40 mg p.o. twice daily - holding home meds and remains WNL   Normocytic anemia- significant drop from 11.4 2 weeks prior- now 9.0>8.1 today. Again, no active bleeding or hematoma at post-op site. Probably partially dilutional as she has gotten a significant fluid load with the hypotension.  - CBC am - anemia panel - holding aspirin, on lovenox DVT ppx   Seizures (HCC) Home Vimpat 200 mg p.o. twice daily resumed Lorazepam 1 mg IV every 6 hours as needed for seizure, 3 doses ordered, with instructions to administer as appropriate and then let provider know   Neuropathy Home gabapentin 300 mg 3 times daily   RLS (restless legs syndrome) Home ropinirole 4 mg nightly resumed  Body mass index is 33.28 kg/m.  VTE ppx: heparin injection 5,000 Units Start: 12/06/23 2200 Place TED hose Start: 12/06/23 1638  Diet:     Diet   Diet Heart Room service appropriate? Yes; Fluid consistency: Thin   Consultants: none  Subjective 12/07/23    Pt reports feeling some nausea. Was able to tolerate breakfast without issues. Denies any signs of bleeding. Denies orthostatic symptoms   Objective   Vitals:   12/07/23 0630 12/07/23 0700 12/07/23 0730 12/07/23 0752  BP: 102/73 103/67 99/64   Pulse: 63 65 66   Resp: 20 (!)  22 20   Temp:    98 F (36.7 C)  TempSrc:    Oral  SpO2: 92% 93% 93%   Height:       No intake or output data in the 24 hours ending 12/07/23 0755 There were no vitals filed for this visit.   Physical Exam:  General: awake, alert, NAD. Mildly lethargic (recently received ativan) HEENT: atraumatic, clear conjunctiva, anicteric sclera, MMM, hearing grossly normal Respiratory: normal respiratory  effort. Cardiovascular: quick capillary refill, normal S1/S2, RRR, no JVD, murmurs Gastrointestinal: soft, NT, ND Nervous: A&O x3. no gross focal neurologic deficits, normal speech Extremities: R knee with steri-strip and ted hose in place. No swelling or tenderness to palpation Skin: dry, intact, normal temperature, normal color. No rashes, lesions or ulcers on exposed skin Psychiatry: normal mood, congruent affect  Labs   I have personally reviewed the following labs and imaging studies CBC    Component Value Date/Time   WBC 7.4 12/07/2023 0338   RBC 2.78 (L) 12/07/2023 0338   HGB 8.1 (L) 12/07/2023 0338   HGB 11.7 (L) 10/03/2014 1838   HCT 24.5 (L) 12/07/2023 0338   HCT 34.9 (L) 10/03/2014 1838   PLT 223 12/07/2023 0338   PLT 273 10/03/2014 1838   MCV 88.1 12/07/2023 0338   MCV 78 (L) 10/03/2014 1838   MCH 29.1 12/07/2023 0338   MCHC 33.1 12/07/2023 0338   RDW 16.2 (H) 12/07/2023 0338   RDW 16.4 (H) 10/03/2014 1838   LYMPHSABS 1.6 12/06/2023 1243   LYMPHSABS 1.1 04/16/2014 0525   MONOABS 0.5 12/06/2023 1243   MONOABS 0.4 04/16/2014 0525   EOSABS 0.4 12/06/2023 1243   EOSABS 0.2 04/16/2014 0525   BASOSABS 0.1 12/06/2023 1243   BASOSABS 0.0 04/16/2014 0525      Latest Ref Rng & Units 12/07/2023    3:38 AM 12/06/2023   12:43 PM 11/22/2023    2:57 PM  BMP  Glucose 70 - 99 mg/dL 161  096  045   BUN 6 - 20 mg/dL 12  10  11    Creatinine 0.44 - 1.00 mg/dL 4.09  8.11  9.14   Sodium 135 - 145 mmol/L 137  138  137   Potassium 3.5 - 5.1 mmol/L 3.3  3.3  3.3   Chloride 98 - 111 mmol/L 97  97  97   CO2 22 - 32 mmol/L 31  31  32   Calcium 8.9 - 10.3 mg/dL 7.9  8.2  8.5     CT CHEST WO CONTRAST Result Date: 12/06/2023 CLINICAL DATA:  Hypotension of unclear source. Recent right knee surgery. EXAM: CT CHEST, ABDOMEN AND PELVIS WITHOUT CONTRAST TECHNIQUE: Multidetector CT imaging of the chest, abdomen and pelvis was performed following the standard protocol without IV contrast.  RADIATION DOSE REDUCTION: This exam was performed according to the departmental dose-optimization program which includes automated exposure control, adjustment of the mA and/or kV according to patient size and/or use of iterative reconstruction technique. COMPARISON:  Chest CT 03/08/2023.  CT abdomen and pelvis 12/15/2021. FINDINGS: CT CHEST FINDINGS Cardiovascular: The heart is mildly enlarged. There is no pericardial effusion. Left-sided pacemaker is present. Aorta is normal in size. Mediastinum/Nodes: No enlarged mediastinal or axillary lymph nodes. Trachea and esophagus are within normal limits. There surgical clips in the region of the thyroid. Lungs/Pleura: There is a 3 mm nodule in the right lung base image 4/105 which is unchanged from 2023. The lungs are otherwise clear. No pleural effusion or pneumothorax. Musculoskeletal: There  subacute appearing right anterolateral fourth through seventh rib fractures. CT ABDOMEN PELVIS FINDINGS Hepatobiliary: No focal liver abnormality is seen. Status post cholecystectomy. No biliary dilatation. Pancreas: Unremarkable. No pancreatic ductal dilatation or surrounding inflammatory changes. Spleen: Normal in size without focal abnormality. Adrenals/Urinary Tract: Adrenal glands are unremarkable. Kidneys are normal, without renal calculi, focal lesion, or hydronephrosis. Bladder is unremarkable. Stomach/Bowel: Sigmoid colon anastomosis present. Scattered colonic diverticula present. Vascular/Lymphatic: IVC filter in place inferior to the level of the renal veins. Aorta and IVC are normal in size. No enlarged lymph nodes are identified. Reproductive: Status post hysterectomy. No adnexal masses. Other: No abdominal wall hernia or abnormality. No abdominopelvic ascites. Musculoskeletal: No acute or significant osseous findings. IMPRESSION: 1. Subacute appearing right anterolateral fourth through seventh rib fractures. 2. No acute localizing process in the abdomen or pelvis. 3.  Colonic diverticulosis. 4. IVC filter in place. 5. Stable 3 mm nodule in the right lung base. No follow-up needed if patient is low-risk.This recommendation follows the consensus statement: Guidelines for Management of Incidental Pulmonary Nodules Detected on CT Images: From the Fleischner Society 2017; Radiology 2017; 284:228-243. Electronically Signed   By: Darliss Cheney M.D.   On: 12/06/2023 16:03   CT ABDOMEN PELVIS WO CONTRAST Result Date: 12/06/2023 CLINICAL DATA:  Hypotension of unclear source. Recent right knee surgery. EXAM: CT CHEST, ABDOMEN AND PELVIS WITHOUT CONTRAST TECHNIQUE: Multidetector CT imaging of the chest, abdomen and pelvis was performed following the standard protocol without IV contrast. RADIATION DOSE REDUCTION: This exam was performed according to the departmental dose-optimization program which includes automated exposure control, adjustment of the mA and/or kV according to patient size and/or use of iterative reconstruction technique. COMPARISON:  Chest CT 03/08/2023.  CT abdomen and pelvis 12/15/2021. FINDINGS: CT CHEST FINDINGS Cardiovascular: The heart is mildly enlarged. There is no pericardial effusion. Left-sided pacemaker is present. Aorta is normal in size. Mediastinum/Nodes: No enlarged mediastinal or axillary lymph nodes. Trachea and esophagus are within normal limits. There surgical clips in the region of the thyroid. Lungs/Pleura: There is a 3 mm nodule in the right lung base image 4/105 which is unchanged from 2023. The lungs are otherwise clear. No pleural effusion or pneumothorax. Musculoskeletal: There subacute appearing right anterolateral fourth through seventh rib fractures. CT ABDOMEN PELVIS FINDINGS Hepatobiliary: No focal liver abnormality is seen. Status post cholecystectomy. No biliary dilatation. Pancreas: Unremarkable. No pancreatic ductal dilatation or surrounding inflammatory changes. Spleen: Normal in size without focal abnormality. Adrenals/Urinary Tract:  Adrenal glands are unremarkable. Kidneys are normal, without renal calculi, focal lesion, or hydronephrosis. Bladder is unremarkable. Stomach/Bowel: Sigmoid colon anastomosis present. Scattered colonic diverticula present. Vascular/Lymphatic: IVC filter in place inferior to the level of the renal veins. Aorta and IVC are normal in size. No enlarged lymph nodes are identified. Reproductive: Status post hysterectomy. No adnexal masses. Other: No abdominal wall hernia or abnormality. No abdominopelvic ascites. Musculoskeletal: No acute or significant osseous findings. IMPRESSION: 1. Subacute appearing right anterolateral fourth through seventh rib fractures. 2. No acute localizing process in the abdomen or pelvis. 3. Colonic diverticulosis. 4. IVC filter in place. 5. Stable 3 mm nodule in the right lung base. No follow-up needed if patient is low-risk.This recommendation follows the consensus statement: Guidelines for Management of Incidental Pulmonary Nodules Detected on CT Images: From the Fleischner Society 2017; Radiology 2017; 284:228-243. Electronically Signed   By: Darliss Cheney M.D.   On: 12/06/2023 16:03   CT HEAD WO CONTRAST ( ) Result Date: 12/06/2023 CLINICAL DATA:  Altered mental status.  EXAM: CT HEAD WITHOUT CONTRAST TECHNIQUE: Contiguous axial images were obtained from the base of the skull through the vertex without intravenous contrast. RADIATION DOSE REDUCTION: This exam was performed according to the departmental dose-optimization program which includes automated exposure control, adjustment of the mA and/or kV according to patient size and/or use of iterative reconstruction technique. COMPARISON:  Head CT 10/06/2023.  MRI brain 09/25/2023. FINDINGS: Brain: No evidence of acute infarction, hemorrhage, hydrocephalus, extra-axial collection or mass lesion/mass effect. Vascular: No hyperdense vessel or unexpected calcification. Skull: Normal. Negative for fracture or focal lesion. Sinuses/Orbits:  There is a small air-fluid level in the left sphenoid sinus. Patient is status post sinonasal surgery bilaterally. Orbits are within normal limits. Other: None. IMPRESSION: 1. No acute intracranial process. 2. Small air-fluid level in the left sphenoid sinus. Correlate for acute sinusitis. Electronically Signed   By: Darliss Cheney M.D.   On: 12/06/2023 15:49   DG Chest Port 1 View Result Date: 12/06/2023 CLINICAL DATA:  Concern for sepsis. EXAM: PORTABLE CHEST 1 VIEW COMPARISON:  Chest radiograph dated 12/20/2023. FINDINGS: Low lung volumes with associated accentuation of the cardiac silhouette. Stable left subclavian dual lead cardiac device in place. No focal consolidation, sizeable pleural effusion, or pneumothorax. No acute osseous abnormality. IMPRESSION: Low lung volumes.  No acute cardiopulmonary findings. Electronically Signed   By: Hart Robinsons M.D.   On: 12/06/2023 14:13    Disposition Plan & Communication  Patient status: Observation  Admitted From: Home Planned disposition location: Home Anticipated discharge date: 4/4 pending hgb stability   Family Communication: none at bedside    Author: Leeroy Bock, DO [facility] 12/07/2023, 7:55 AM   Available by Epic secure chat 7AM-7PM. If 7PM-7AM, please contact night-coverage.  [facility] contact information found on [email or web address].

## 2023-12-07 NOTE — ED Notes (Signed)
 Patient called this RN into the room due to new onset chest pain in the center of her chest. Patient states she has had the pain intermittently since her surgery, but that this is the first time since she has been here at the hospital that she has had it. This RN performed an EKG which Dr. Anner Crete signed off on at 0051. This RN attempted to contact Mansy, MD by phone twice with no answer. Will continue to monitor.

## 2023-12-07 NOTE — TOC CM/SW Note (Signed)
 MOON letter given to patient.

## 2023-12-07 NOTE — Progress Notes (Signed)
 Patient arrived from ED to 121B via stretcher. Patient able to ambulate from stretcher to bed.    12/07/23 1232  Vitals  Temp 98.6 F (37 C)  Temp Source Oral  BP 114/66  MAP (mmHg) 81  BP Location Left Arm  BP Method Automatic  Patient Position (if appropriate) Lying  Pulse Rate 73  Pulse Rate Source Monitor  ECG Heart Rate 79  Resp 15  Level of Consciousness  Level of Consciousness Alert  Oxygen Therapy  SpO2 93 %  O2 Device Room Air  MEWS Score  MEWS Temp 0  MEWS Systolic 0  MEWS Pulse 0  MEWS RR 0  MEWS LOC 0  MEWS Score 0  MEWS Score Color Green

## 2023-12-07 NOTE — Evaluation (Signed)
 Physical Therapy Evaluation Patient Details Name: Brittany Nguyen MRN: [id number] DOB: 1966/07/06 Today's Date: 12/07/2023  History of Present Illness  Brittany Nguyen is a 57yoF 12/06/23 after a couple days dizziness, weakness, hypotension. PMH: hypoTSH, GAD, HTN, AF, DVT s/p IVC filter, CAD s/p AICD, COPD. Rib fractures 2/2 remote CPR. Pt admitted for hypovolemia s/p knee surgery, poor PO intake. Pt underwent Rt TKA with Dr. Ernest Nguyen here on 12/01/23.  Clinical Impression  Pt asleep, easily awakened, agreeable to session, denies any symptoms this date, able to perform all mobility at supervision level with RW, knee pain well controlled. Pt reports seeing HHPT 1x since DC (scheduled for visit 2 today). Pt feels confidence in ability to return home safely. Will continue to follow.       If plan is discharge home, recommend the following: A little help with walking and/or transfers;A little help with bathing/dressing/bathroom;Assistance with cooking/housework;Help with stairs or ramp for entrance;Assist for transportation   Can travel by private vehicle        Equipment Recommendations None recommended by PT  Recommendations for Other Services       Functional Status Assessment Patient has had a recent decline in their functional status and demonstrates the ability to make significant improvements in function in a reasonable and predictable amount of time.     Precautions / Restrictions Precautions Precautions: Fall Recall of Precautions/Restrictions: Intact Restrictions RLE Weight Bearing Per Provider Order: Weight bearing as tolerated      Mobility  Bed Mobility Overal bed mobility: Modified Independent                  Transfers Overall transfer level: Modified independent Equipment used: Rolling walker (2 wheels)   Sit to Stand: Modified independent (Device/Increase time)                Ambulation/Gait Ambulation/Gait assistance: Supervision Gait Distance  (Feet): 160 Feet Assistive device: Rolling walker (2 wheels) Gait Pattern/deviations: Step-through pattern       General Gait Details: mildly antalgic  Stairs            Wheelchair Mobility     Tilt Bed    Modified Rankin (Stroke Patients Only)       Balance                                             Pertinent Vitals/Pain Pain Assessment Pain Assessment: 0-10 Pain Score: 4  Pain Location: R knee Pain Descriptors / Indicators: Operative site guarding Pain Intervention(s): Limited activity within patient's tolerance, Monitored during session, Premedicated before session, Repositioned    Home Living Family/patient expects to be discharged to:: Private residence Living Arrangements: Spouse/significant other Available Help at Discharge: Family;Available 24 hours/day Type of Home: Mobile home Home Access: Stairs to enter Entrance Stairs-Rails: Can reach both;Right;Left     Home Layout: One level Home Equipment: Grab bars - tub/shower;Shower seat - built in;Cane - single Librarian, academic (2 wheels);BSC/3in1      Prior Function Prior Level of Function : Independent/Modified Independent;Driving             Mobility Comments: Ind amb community distances without an AD, no fall history ADLs Comments: Ind with ADLs     Extremity/Trunk Assessment                Communication        Cognition  Arousal: Alert Behavior During Therapy: WFL for tasks assessed/performed   PT - Cognitive impairments: No apparent impairments                                 Cueing       General Comments      Exercises Total Joint Exercises Heel Slides: AAROM, Right, 10 reps, Supine Long Arc Quad: AROM, 15 reps, Right, Seated Goniometric ROM: Rt knee ROM: <10-85 degrees   Assessment/Plan    PT Assessment Patient needs continued PT services  PT Problem List Decreased strength;Decreased range of motion;Decreased activity  tolerance;Decreased balance;Decreased mobility;Decreased knowledge of use of DME;Pain       PT Treatment Interventions DME instruction;Gait training;Stair training;Functional mobility training;Therapeutic activities;Therapeutic exercise;Balance training;Patient/family education    PT Goals (Current goals can be found in the Care Plan section)  Acute Rehab PT Goals Patient Stated Goal: To walk better and to be able to play with my grandchildren PT Goal Formulation: With patient Time For Goal Achievement: 12/21/23 Potential to Achieve Goals: Good    Frequency Min 3X/week     Co-evaluation               AM-PAC PT "6 Clicks" Mobility  Outcome Measure Help needed turning from your back to your side while in a flat bed without using bedrails?: A Little Help needed moving from lying on your back to sitting on the side of a flat bed without using bedrails?: A Little Help needed moving to and from a bed to a chair (including a wheelchair)?: A Little Help needed standing up from a chair using your arms (e.g., wheelchair or bedside chair)?: A Little Help needed to walk in hospital room?: A Little Help needed climbing 3-5 steps with a railing? : A Little 6 Click Score: 18    End of Session Equipment Utilized During Treatment: Gait belt Activity Tolerance: Patient tolerated treatment well;No increased pain Patient left: with call bell/phone within reach;in chair;with chair alarm set Nurse Communication: Mobility status PT Visit Diagnosis: Other abnormalities of gait and mobility (R26.89);Muscle weakness (generalized) (M62.81);Difficulty in walking, not elsewhere classified (R26.2)    Time: 4696-2952 PT Time Calculation (min) (ACUTE ONLY): 27 min   Charges:   PT Evaluation $PT Eval Low Complexity: 1 Low PT Treatments $Therapeutic Exercise: 8-22 mins PT General Charges $$ ACUTE PT VISIT: 1 Visit       3:02 PM, 12/07/23 Brittany Nguyen, PT, DPT Physical Therapist - [facility]  [phone] (ASCOM)    Brittany Nguyen C 12/07/2023, 3:01 PM

## 2023-12-07 NOTE — ED Notes (Signed)
 Pt insisting that she needs something for nausea. RN walked into pt room and pt was asleep and didn't even hear RN walked into room.

## 2023-12-07 NOTE — Care Management Obs Status (Signed)
 MEDICARE OBSERVATION STATUS NOTIFICATION   Patient Details  Name: Brittany Nguyen MRN: [id number] Date of Birth: Sep 02, 1966   Medicare Observation Status Notification Given:  Yes    [place] D Jaziah Kwasnik, LCSW 12/07/2023, 5:40 PM

## 2023-12-08 DIAGNOSIS — R0602 Shortness of breath: Secondary | ICD-10-CM | POA: Diagnosis not present

## 2023-12-08 DIAGNOSIS — I959 Hypotension, unspecified: Principal | ICD-10-CM

## 2023-12-08 DIAGNOSIS — R110 Nausea: Secondary | ICD-10-CM | POA: Diagnosis not present

## 2023-12-08 LAB — RETICULOCYTES
Immature Retic Fract: 21.9 % — ABNORMAL HIGH (ref 2.3–15.9)
RBC.: 2.96 MIL/uL — ABNORMAL LOW (ref 3.87–5.11)
Retic Count, Absolute: 71.3 10*3/uL (ref 19.0–186.0)
Retic Ct Pct: 2.4 % (ref 0.4–3.1)

## 2023-12-08 LAB — CBC
HCT: 26 % — ABNORMAL LOW (ref 36.0–46.0)
Hemoglobin: 8.6 g/dL — ABNORMAL LOW (ref 12.0–15.0)
MCH: 28.8 pg (ref 26.0–34.0)
MCHC: 33.1 g/dL (ref 30.0–36.0)
MCV: 87 fL (ref 80.0–100.0)
Platelets: 238 10*3/uL (ref 150–400)
RBC: 2.99 MIL/uL — ABNORMAL LOW (ref 3.87–5.11)
RDW: 15.9 % — ABNORMAL HIGH (ref 11.5–15.5)
WBC: 5.1 10*3/uL (ref 4.0–10.5)
nRBC: 0 % (ref 0.0–0.2)

## 2023-12-08 LAB — VITAMIN B12: Vitamin B-12: 220 pg/mL (ref 180–914)

## 2023-12-08 LAB — FOLATE: Folate: 22 ng/mL (ref >5.9)

## 2023-12-08 LAB — IRON AND TIBC
Iron: 23 ug/dL — ABNORMAL LOW (ref 28–170)
Saturation Ratios: 6 % — ABNORMAL LOW (ref 10.4–31.8)
TIBC: 361 ug/dL (ref 250–450)
UIBC: 338 ug/dL

## 2023-12-08 LAB — TSH: TSH: 99 u[IU]/mL — ABNORMAL HIGH (ref 0.350–4.500)

## 2023-12-08 LAB — FERRITIN: Ferritin: 17 ng/mL (ref 11–307)

## 2023-12-08 MED ORDER — ENSURE ENLIVE PO LIQD
237.0000 mL | Freq: Three times a day (TID) | ORAL | Status: DC
  Administered 2023-12-08: 237 mL via ORAL

## 2023-12-08 NOTE — Discharge Instructions (Signed)
 Please make a follow up appointment with your PCP within 1-2 weeks to recheck your blood pressure Do not take your blood pressure medications before that. Your pressures look normal and great without the medications currently but they may have to slowly be restarted as you continue to recover from your recent surgery

## 2023-12-08 NOTE — Discharge Summary (Signed)
 Physician Discharge Summary  Patient: Brittany Nguyen ZOX:[id number] DOB: Jul 30, 1966   Code Status: Full Code Admit date: 12/06/2023 Discharge date: 12/08/2023 Disposition: Home, PT PCP: Jerl Mina, MD  Recommendations for Outpatient Follow-up:  Follow up with PCP within 1-2 weeks Regarding general hospital follow up and preventative care Recommend  Follow up with ortho   Discharge Diagnoses:  Principal Problem:   Hypotension Active Problems:   OSA on CPAP   Type 2 diabetes mellitus without complication (HCC)   Hypothyroidism   HTN (hypertension)   Normocytic anemia   GERD   RLS (restless legs syndrome)   Non-ischemic cardiomyopathy (HCC)   S/P implantation of automatic cardioverter/defibrillator (AICD)   Neuropathy   Anxiety   Seizures (HCC)   Neuroendocrine carcinoma (HCC)   Seizure-like activity (HCC)   Shortness of breath   Nausea  Brief Hospital Course Summary: Ms. Brittany Nguyen is a 58 year old female with history of hypothyroid, anxiety, hypertension, restless leg syndrome, atrial fibrillation, history of DVT status post IVC filter placement, CAD status post AICD, seizure, hypertension, COPD, anxiety, who presents to the emergency department for chief concerns of dizziness and weakness for 2 days associated with low BP reading at home. Vitals in the ED showed temperature 98, respiration rate of 18, heart rate of 59, blood pressure initially 78/58 then after IVF= 92/65 with a MAP of 74. SpO2 100% on room air.   ED treatment: LR 1 L bolus, sodium chloride 500 mL liter bolus.   Hypotension likely related to hypovolemia s/p knee surgery, poor PO intake, continued on home antihypertensives.  She had decrease in her hgb from her pre-op number of 11.4 to as low as 8.1 and remained stable without needing any transfusion. No signs of active bleeding. Initially had positive orthostatic symptoms but this resolved with supportive care. Stopped NSAIDs to decrease risk of  bleeding.   Her home BP meds were held throughout admission and at dc. They may need to be titrated back on as she recovers post-op. Her orthostatic symptoms resolved. PT evaluated and recommended HH.   All other chronic conditions were treated with home medications.    Discharge Condition: Good, improved Recommended discharge diet: Regular healthy diet  Consultations: None   Procedures/Studies: None   Allergies as of 12/08/2023       Reactions   Contrast Media [iodinated Contrast Media] Shortness Of Breath   Per CT report in 2014 pt had break through contrast reaction with hives and SOB following 13 hour prep. MSY    Ferumoxytol Nausea Only   Iron Sucrose Anaphylaxis   Lidocaine Hives   Metrizamide Shortness Of Breath   Penicillins Hives, Other (See Comments)   IgE = 4 (WNL) on 08/12/2022 ediate rash, facial/tongue/throat swelling, SOB or lightheadedness with hypotension, tachy   Sacubitril-valsartan    Sz (unclear if new start entresto vs hypoglycemia)   Isosorbide Nitrate Other (See Comments)   Headache   Latex Hives   IgE < 0.10 (WNL) on 08/21/2023   Ondansetron Other (See Comments)   Severe headache   Tizanidine Other (See Comments)   Feels altered   Povidone-iodine Rash   Blistering rash   Pulmicort [budesonide] Itching        Medication List     STOP taking these medications    celecoxib 200 MG capsule Commonly known as: CELEBREX   losartan 25 MG tablet Commonly known as: COZAAR   metoprolol succinate 50 MG 24 hr tablet Commonly known as: TOPROL-XL   mupirocin  ointment 2 % Commonly known as: BACTROBAN   Muscle Rub 10-15 % Crea   torsemide 20 MG tablet Commonly known as: DEMADEX       TAKE these medications    aspirin 81 MG chewable tablet Chew 1 tablet (81 mg total) by mouth 2 (two) times daily.   chlorhexidine 4 % external liquid Commonly known as: HIBICLENS Apply 15 mLs (1 Application total) topically as directed for 30 doses. Use as  directed daily for 5 days every other week for 6 weeks.   escitalopram 10 MG tablet Commonly known as: LEXAPRO Take 10 mg by mouth daily.   gabapentin 300 MG capsule Commonly known as: NEURONTIN Take 1 capsule (300 mg total) by mouth 3 (three) times daily.   lacosamide 200 MG Tabs tablet Commonly known as: VIMPAT Take 1 tablet (200 mg total) by mouth 2 (two) times daily.   levothyroxine 175 MCG tablet Commonly known as: SYNTHROID Take 1 tablet (175 mcg total) by mouth daily at 6 (six) AM.   LORazepam 1 MG tablet Commonly known as: ATIVAN Take 1 tablet (1 mg total) by mouth 4 (four) times daily as needed.   methadone 5 MG tablet Commonly known as: DOLOPHINE Take 5 mg by mouth See admin instructions. Patient takes 1.5 tabs (7.5 mg) every morning, and 1.5 tabs (7.5 mg) midday,  and 2 tabs (10 mg) at bedtime. What changed: Another medication with the same name was removed. Continue taking this medication, and follow the directions you see here.   oxyCODONE-acetaminophen 10-325 MG tablet Commonly known as: PERCOCET Take 1 tablet by mouth every 4 (four) hours as needed for pain.   potassium chloride SA 20 MEQ tablet Commonly known as: KLOR-CON M Take 20 mEq by mouth 2 (two) times daily.   promethazine 25 MG tablet Commonly known as: PHENERGAN Take 25 mg by mouth every 4 (four) hours as needed for nausea or vomiting.   rOPINIRole 4 MG tablet Commonly known as: REQUIP Take 4 mg by mouth at bedtime.   tiZANidine 4 MG capsule Commonly known as: ZANAFLEX Take 4 mg by mouth 3 (three) times daily as needed for muscle spasms.        Follow-up Information     Jerl Mina, MD Follow up.   Specialty: Family Medicine Why: Hospital follow up Contact information: [address] [facility] [place] [address] [phone]                Subjective   Pt reports feeling well. Had no orthostasis with getting out of bed and working with PT. No bleeding.  Denies knee pain.   All questions and concerns were addressed at time of discharge.  Objective  Blood pressure 117/74, pulse 80, temperature 98 F (36.7 C), resp. rate 17, height 5\' 5"  (1.651 m), weight 90.7 kg, last menstrual period 01/22/2012, SpO2 94%.   General: Pt is alert, awake, not in acute distress Cardiovascular: RRR, S1/S2 +, no rubs, no gallops Respiratory: CTA bilaterally, no wheezing, no rhonchi Abdominal: Soft, NT, ND, bowel sounds + Extremities: no edema, no cyanosis  The results of significant diagnostics from this hospitalization (including imaging, microbiology, ancillary and laboratory) are listed below for reference.   Imaging studies: CT CHEST WO CONTRAST Result Date: 12/06/2023 CLINICAL DATA:  Hypotension of unclear source. Recent right knee surgery. EXAM: CT CHEST, ABDOMEN AND PELVIS WITHOUT CONTRAST TECHNIQUE: Multidetector CT imaging of the chest, abdomen and pelvis was performed following the standard protocol without IV contrast. RADIATION  DOSE REDUCTION: This exam was performed according to the departmental dose-optimization program which includes automated exposure control, adjustment of the mA and/or kV according to patient size and/or use of iterative reconstruction technique. COMPARISON:  Chest CT 03/08/2023.  CT abdomen and pelvis 12/15/2021. FINDINGS: CT CHEST FINDINGS Cardiovascular: The heart is mildly enlarged. There is no pericardial effusion. Left-sided pacemaker is present. Aorta is normal in size. Mediastinum/Nodes: No enlarged mediastinal or axillary lymph nodes. Trachea and esophagus are within normal limits. There surgical clips in the region of the thyroid. Lungs/Pleura: There is a 3 mm nodule in the right lung base image 4/105 which is unchanged from 2023. The lungs are otherwise clear. No pleural effusion or pneumothorax. Musculoskeletal: There subacute appearing right anterolateral fourth through seventh rib fractures. CT ABDOMEN PELVIS FINDINGS  Hepatobiliary: No focal liver abnormality is seen. Status post cholecystectomy. No biliary dilatation. Pancreas: Unremarkable. No pancreatic ductal dilatation or surrounding inflammatory changes. Spleen: Normal in size without focal abnormality. Adrenals/Urinary Tract: Adrenal glands are unremarkable. Kidneys are normal, without renal calculi, focal lesion, or hydronephrosis. Bladder is unremarkable. Stomach/Bowel: Sigmoid colon anastomosis present. Scattered colonic diverticula present. Vascular/Lymphatic: IVC filter in place inferior to the level of the renal veins. Aorta and IVC are normal in size. No enlarged lymph nodes are identified. Reproductive: Status post hysterectomy. No adnexal masses. Other: No abdominal wall hernia or abnormality. No abdominopelvic ascites. Musculoskeletal: No acute or significant osseous findings. IMPRESSION: 1. Subacute appearing right anterolateral fourth through seventh rib fractures. 2. No acute localizing process in the abdomen or pelvis. 3. Colonic diverticulosis. 4. IVC filter in place. 5. Stable 3 mm nodule in the right lung base. No follow-up needed if patient is low-risk.This recommendation follows the consensus statement: Guidelines for Management of Incidental Pulmonary Nodules Detected on CT Images: From the Fleischner Society 2017; Radiology 2017; 284:228-243. Electronically Signed   By: Darliss Cheney M.D.   On: 12/06/2023 16:03   CT ABDOMEN PELVIS WO CONTRAST Result Date: 12/06/2023 CLINICAL DATA:  Hypotension of unclear source. Recent right knee surgery. EXAM: CT CHEST, ABDOMEN AND PELVIS WITHOUT CONTRAST TECHNIQUE: Multidetector CT imaging of the chest, abdomen and pelvis was performed following the standard protocol without IV contrast. RADIATION DOSE REDUCTION: This exam was performed according to the departmental dose-optimization program which includes automated exposure control, adjustment of the mA and/or kV according to patient size and/or use of iterative  reconstruction technique. COMPARISON:  Chest CT 03/08/2023.  CT abdomen and pelvis 12/15/2021. FINDINGS: CT CHEST FINDINGS Cardiovascular: The heart is mildly enlarged. There is no pericardial effusion. Left-sided pacemaker is present. Aorta is normal in size. Mediastinum/Nodes: No enlarged mediastinal or axillary lymph nodes. Trachea and esophagus are within normal limits. There surgical clips in the region of the thyroid. Lungs/Pleura: There is a 3 mm nodule in the right lung base image 4/105 which is unchanged from 2023. The lungs are otherwise clear. No pleural effusion or pneumothorax. Musculoskeletal: There subacute appearing right anterolateral fourth through seventh rib fractures. CT ABDOMEN PELVIS FINDINGS Hepatobiliary: No focal liver abnormality is seen. Status post cholecystectomy. No biliary dilatation. Pancreas: Unremarkable. No pancreatic ductal dilatation or surrounding inflammatory changes. Spleen: Normal in size without focal abnormality. Adrenals/Urinary Tract: Adrenal glands are unremarkable. Kidneys are normal, without renal calculi, focal lesion, or hydronephrosis. Bladder is unremarkable. Stomach/Bowel: Sigmoid colon anastomosis present. Scattered colonic diverticula present. Vascular/Lymphatic: IVC filter in place inferior to the level of the renal veins. Aorta and IVC are normal in size. No enlarged lymph nodes are identified. Reproductive:  Status post hysterectomy. No adnexal masses. Other: No abdominal wall hernia or abnormality. No abdominopelvic ascites. Musculoskeletal: No acute or significant osseous findings. IMPRESSION: 1. Subacute appearing right anterolateral fourth through seventh rib fractures. 2. No acute localizing process in the abdomen or pelvis. 3. Colonic diverticulosis. 4. IVC filter in place. 5. Stable 3 mm nodule in the right lung base. No follow-up needed if patient is low-risk.This recommendation follows the consensus statement: Guidelines for Management of Incidental  Pulmonary Nodules Detected on CT Images: From the Fleischner Society 2017; Radiology 2017; 284:228-243. Electronically Signed   By: Darliss Cheney M.D.   On: 12/06/2023 16:03   CT HEAD WO CONTRAST ( ) Result Date: 12/06/2023 CLINICAL DATA:  Altered mental status. EXAM: CT HEAD WITHOUT CONTRAST TECHNIQUE: Contiguous axial images were obtained from the base of the skull through the vertex without intravenous contrast. RADIATION DOSE REDUCTION: This exam was performed according to the departmental dose-optimization program which includes automated exposure control, adjustment of the mA and/or kV according to patient size and/or use of iterative reconstruction technique. COMPARISON:  Head CT 10/06/2023.  MRI brain 09/25/2023. FINDINGS: Brain: No evidence of acute infarction, hemorrhage, hydrocephalus, extra-axial collection or mass lesion/mass effect. Vascular: No hyperdense vessel or unexpected calcification. Skull: Normal. Negative for fracture or focal lesion. Sinuses/Orbits: There is a small air-fluid level in the left sphenoid sinus. Patient is status post sinonasal surgery bilaterally. Orbits are within normal limits. Other: None. IMPRESSION: 1. No acute intracranial process. 2. Small air-fluid level in the left sphenoid sinus. Correlate for acute sinusitis. Electronically Signed   By: Darliss Cheney M.D.   On: 12/06/2023 15:49   DG Chest Port 1 View Result Date: 12/06/2023 CLINICAL DATA:  Concern for sepsis. EXAM: PORTABLE CHEST 1 VIEW COMPARISON:  Chest radiograph dated 12/20/2023. FINDINGS: Low lung volumes with associated accentuation of the cardiac silhouette. Stable left subclavian dual lead cardiac device in place. No focal consolidation, sizeable pleural effusion, or pneumothorax. No acute osseous abnormality. IMPRESSION: Low lung volumes.  No acute cardiopulmonary findings. Electronically Signed   By: Hart Robinsons M.D.   On: 12/06/2023 14:13   DG Knee Right Port Result Date: 12/01/2023 CLINICAL  DATA:  Status post right knee arthroplasty. EXAM: PORTABLE RIGHT KNEE - 1-2 VIEW COMPARISON:  Right knee radiographs 05/13/2018 FINDINGS: Interval total right knee arthroplasty. No perihardware lucency is seen to indicate hardware failure or loosening. Expected postoperative changes including intra-articular and subcutaneous air. Smalljoint effusion. Anterior surgical skin staples. There is a superior approach surgical drain overlying the anterior femorotibial joint space. Old screw tracts are seen within the distal femoral diaphysis and proximal tibial diaphysis. No acute fracture or dislocation. IMPRESSION: Interval total right knee arthroplasty without evidence of hardware failure. Electronically Signed   By: Neita Garnet M.D.   On: 12/01/2023 13:09   DG Humerus Right Result Date: 11/19/2023 CLINICAL DATA:  Right arm pain after fall EXAM: RIGHT HUMERUS - 2+ VIEW COMPARISON:  None Available. FINDINGS: There is no evidence of fracture or other focal bone lesions. Soft tissues are unremarkable. IMPRESSION: Negative. Electronically Signed   By: Minerva Fester M.D.   On: 11/19/2023 02:51   DG Chest Port 1 View Result Date: 11/19/2023 CLINICAL DATA:  Fall, right chest pain. EXAM: PORTABLE CHEST 1 VIEW COMPARISON:  11/15/2023 FINDINGS: Left chest wall ICD. Stable cardiomegaly. Low lung volumes accentuate pulmonary vascularity. No focal consolidation, pleural effusion, or pneumothorax. No displaced rib fractures. IMPRESSION: No active disease. Electronically Signed   By: Angelique Holm.D.  On: 11/19/2023 02:51   DG Hip Unilat W or Wo Pelvis 2-3 Views Right Result Date: 11/19/2023 CLINICAL DATA:  Fall, pain in right humerus, right hip, and right chest EXAM: DG HIP (WITH OR WITHOUT PELVIS) 2-3V RIGHT COMPARISON:  None Available. FINDINGS: There is no evidence of hip fracture or dislocation. There is no evidence of arthropathy or other focal bone abnormality. IMPRESSION: Negative. Electronically Signed   By:  Minerva Fester M.D.   On: 11/19/2023 02:50   DG Chest 2 View Result Date: 11/15/2023 CLINICAL DATA:  58 year old female with history of chest pain. Patient reports a history of CPR yesterday. Right-sided rib pain. Shortness of breath. EXAM: CHEST - 2 VIEW COMPARISON:  Multiple priors, most recently 10/06/2023. FINDINGS: Lung volumes are low. No consolidative airspace disease. No pleural effusions. No pneumothorax. No pulmonary nodule or mass noted. Pulmonary vasculature and the cardiomediastinal silhouette are within normal limits. Left-sided pacemaker/AICD with lead tips projecting over the expected location of the right atrium and right ventricle. IMPRESSION: 1. Low lung volumes without radiographic evidence of acute cardiopulmonary disease. Electronically Signed   By: Trudie Reed M.D.   On: 11/15/2023 06:52    Labs: Basic Metabolic Panel: Recent Labs  Lab 12/06/23 1243 12/07/23 0338  NA 138 137  K 3.3* 3.3*  CL 97* 97*  CO2 31 31  GLUCOSE 125* 124*  BUN 10 12  CREATININE 1.03* 1.14*  CALCIUM 8.2* 7.9*   CBC: Recent Labs  Lab 12/06/23 1243 12/07/23 0338 12/08/23 0546  WBC 8.7 7.4 5.1  NEUTROABS 6.0  --   --   HGB 9.0* 8.1* 8.6*  HCT 27.8* 24.5* 26.0*  MCV 88.0 88.1 87.0  PLT 273 223 238   Microbiology: Results for orders placed or performed during the hospital encounter of 12/06/23  Blood Culture (routine x 2)     Status: None (Preliminary result)   Collection Time: 12/06/23 12:42 PM   Specimen: BLOOD  Result Value Ref Range Status   Specimen Description BLOOD LEFT AC  Final   Special Requests   Final    BOTTLES DRAWN AEROBIC AND ANAEROBIC Blood Culture results may not be optimal due to an inadequate volume of blood received in culture bottles   Culture   Final    NO GROWTH 2 DAYS Performed at [facility], [address], [place], [place] [address]    Report Status PENDING  Incomplete  Blood Culture (routine x 2)     Status: None (Preliminary result)    Collection Time: 12/06/23 12:42 PM   Specimen: BLOOD  Result Value Ref Range Status   Specimen Description BLOOD RIGHT HAND  Final   Special Requests   Final    BOTTLES DRAWN AEROBIC AND ANAEROBIC Blood Culture results may not be optimal due to an inadequate volume of blood received in culture bottles   Culture   Final    NO GROWTH 2 DAYS Performed at [facility], [address] [place], [place] [address]    Report Status PENDING  Incomplete    Time coordinating discharge: Over 30 minutes  Leeroy Bock, MD  [facility] 12/08/2023, 11:57 AM

## 2023-12-08 NOTE — Progress Notes (Signed)
 Patient is alert and oriented X 4. Discharge instructions given. No any questions at this time. Pi/v removed.

## 2023-12-08 NOTE — TOC Transition Note (Signed)
 Transition of Care [facility]) - Discharge Note   Patient Details  Name: Brittany Nguyen MRN: [id number] Date of Birth: Nov 26, 1965  [facility] CM/SW Contact:  Cherre Blanc, RN Phone Number: 12/08/2023, 12:18 PM   Clinical Narrative:    Patient is medically clear for dc to home with resumption of HH PT with [facility]. TOC spoke with Brandi at [facility] [phone] and they will get her back on the schedule. No other TOC needs.         Patient Goals and CMS Choice            Discharge Placement                       Discharge Plan and Services Additional resources added to the After Visit Summary for                                       Social Drivers of Health (SDOH) Interventions SDOH Screenings   Food Insecurity: No Food Insecurity (12/06/2023)  Recent Concern: Food Insecurity - Food Insecurity Present (11/24/2023)   Received from [facility]  Housing: Low Risk  (12/06/2023)  Transportation Needs: No Transportation Needs (12/06/2023)  Utilities: Not At Risk (12/06/2023)  Financial Resource Strain: High Risk (11/24/2023)   Received from [facility]  Physical Activity: Insufficiently Active (12/07/2020)   Received from [facility], [facility]  Social Connections: Socially Integrated (12/01/2023)  Stress: Stress Concern Present (12/07/2020)   Received from [facility], [facility]  Tobacco Use: Low Risk  (12/06/2023)     Readmission Risk Interventions     No data to display

## 2023-12-11 DIAGNOSIS — I110 Hypertensive heart disease with heart failure: Secondary | ICD-10-CM | POA: Diagnosis not present

## 2023-12-11 DIAGNOSIS — I2510 Atherosclerotic heart disease of native coronary artery without angina pectoris: Secondary | ICD-10-CM | POA: Diagnosis not present

## 2023-12-11 DIAGNOSIS — I5020 Unspecified systolic (congestive) heart failure: Secondary | ICD-10-CM | POA: Diagnosis not present

## 2023-12-11 DIAGNOSIS — G43909 Migraine, unspecified, not intractable, without status migrainosus: Secondary | ICD-10-CM | POA: Diagnosis not present

## 2023-12-11 DIAGNOSIS — G40909 Epilepsy, unspecified, not intractable, without status epilepticus: Secondary | ICD-10-CM | POA: Diagnosis not present

## 2023-12-11 DIAGNOSIS — J4489 Other specified chronic obstructive pulmonary disease: Secondary | ICD-10-CM | POA: Diagnosis not present

## 2023-12-11 DIAGNOSIS — Z471 Aftercare following joint replacement surgery: Secondary | ICD-10-CM | POA: Diagnosis not present

## 2023-12-11 DIAGNOSIS — I480 Paroxysmal atrial fibrillation: Secondary | ICD-10-CM | POA: Diagnosis not present

## 2023-12-11 DIAGNOSIS — E1140 Type 2 diabetes mellitus with diabetic neuropathy, unspecified: Secondary | ICD-10-CM | POA: Diagnosis not present

## 2023-12-11 LAB — CULTURE, BLOOD (ROUTINE X 2)
Culture: NO GROWTH
Culture: NO GROWTH

## 2023-12-12 ENCOUNTER — Encounter: Payer: Medicare HMO | Admitting: Occupational Therapy

## 2023-12-13 DIAGNOSIS — I110 Hypertensive heart disease with heart failure: Secondary | ICD-10-CM | POA: Diagnosis not present

## 2023-12-13 DIAGNOSIS — I5020 Unspecified systolic (congestive) heart failure: Secondary | ICD-10-CM | POA: Diagnosis not present

## 2023-12-13 DIAGNOSIS — E1140 Type 2 diabetes mellitus with diabetic neuropathy, unspecified: Secondary | ICD-10-CM | POA: Diagnosis not present

## 2023-12-13 DIAGNOSIS — G43909 Migraine, unspecified, not intractable, without status migrainosus: Secondary | ICD-10-CM | POA: Diagnosis not present

## 2023-12-13 DIAGNOSIS — I2510 Atherosclerotic heart disease of native coronary artery without angina pectoris: Secondary | ICD-10-CM | POA: Diagnosis not present

## 2023-12-13 DIAGNOSIS — I480 Paroxysmal atrial fibrillation: Secondary | ICD-10-CM | POA: Diagnosis not present

## 2023-12-13 DIAGNOSIS — J4489 Other specified chronic obstructive pulmonary disease: Secondary | ICD-10-CM | POA: Diagnosis not present

## 2023-12-13 DIAGNOSIS — Z471 Aftercare following joint replacement surgery: Secondary | ICD-10-CM | POA: Diagnosis not present

## 2023-12-13 DIAGNOSIS — G40909 Epilepsy, unspecified, not intractable, without status epilepticus: Secondary | ICD-10-CM | POA: Diagnosis not present

## 2023-12-14 ENCOUNTER — Encounter: Payer: Medicare HMO | Admitting: Occupational Therapy

## 2023-12-14 DIAGNOSIS — Z4502 Encounter for adjustment and management of automatic implantable cardiac defibrillator: Secondary | ICD-10-CM | POA: Diagnosis not present

## 2023-12-14 DIAGNOSIS — I5042 Chronic combined systolic (congestive) and diastolic (congestive) heart failure: Secondary | ICD-10-CM | POA: Diagnosis not present

## 2023-12-15 DIAGNOSIS — I5020 Unspecified systolic (congestive) heart failure: Secondary | ICD-10-CM | POA: Diagnosis not present

## 2023-12-15 DIAGNOSIS — G40909 Epilepsy, unspecified, not intractable, without status epilepticus: Secondary | ICD-10-CM | POA: Diagnosis not present

## 2023-12-15 DIAGNOSIS — I2510 Atherosclerotic heart disease of native coronary artery without angina pectoris: Secondary | ICD-10-CM | POA: Diagnosis not present

## 2023-12-15 DIAGNOSIS — J4489 Other specified chronic obstructive pulmonary disease: Secondary | ICD-10-CM | POA: Diagnosis not present

## 2023-12-15 DIAGNOSIS — E1140 Type 2 diabetes mellitus with diabetic neuropathy, unspecified: Secondary | ICD-10-CM | POA: Diagnosis not present

## 2023-12-15 DIAGNOSIS — Z471 Aftercare following joint replacement surgery: Secondary | ICD-10-CM | POA: Diagnosis not present

## 2023-12-15 DIAGNOSIS — I480 Paroxysmal atrial fibrillation: Secondary | ICD-10-CM | POA: Diagnosis not present

## 2023-12-15 DIAGNOSIS — I110 Hypertensive heart disease with heart failure: Secondary | ICD-10-CM | POA: Diagnosis not present

## 2023-12-15 DIAGNOSIS — G43909 Migraine, unspecified, not intractable, without status migrainosus: Secondary | ICD-10-CM | POA: Diagnosis not present

## 2023-12-18 DIAGNOSIS — Z96651 Presence of right artificial knee joint: Secondary | ICD-10-CM | POA: Diagnosis not present

## 2023-12-18 DIAGNOSIS — M25561 Pain in right knee: Secondary | ICD-10-CM | POA: Diagnosis not present

## 2023-12-19 ENCOUNTER — Encounter: Payer: Medicare HMO | Admitting: Occupational Therapy

## 2023-12-20 DIAGNOSIS — G8929 Other chronic pain: Secondary | ICD-10-CM | POA: Diagnosis not present

## 2023-12-20 DIAGNOSIS — M25561 Pain in right knee: Secondary | ICD-10-CM | POA: Diagnosis not present

## 2023-12-20 DIAGNOSIS — Z96651 Presence of right artificial knee joint: Secondary | ICD-10-CM | POA: Diagnosis not present

## 2023-12-20 DIAGNOSIS — M6281 Muscle weakness (generalized): Secondary | ICD-10-CM | POA: Diagnosis not present

## 2023-12-20 DIAGNOSIS — M25661 Stiffness of right knee, not elsewhere classified: Secondary | ICD-10-CM | POA: Diagnosis not present

## 2023-12-21 DIAGNOSIS — Z471 Aftercare following joint replacement surgery: Secondary | ICD-10-CM | POA: Diagnosis not present

## 2023-12-25 DIAGNOSIS — M25561 Pain in right knee: Secondary | ICD-10-CM | POA: Diagnosis not present

## 2023-12-25 DIAGNOSIS — G8929 Other chronic pain: Secondary | ICD-10-CM | POA: Diagnosis not present

## 2023-12-25 DIAGNOSIS — M25661 Stiffness of right knee, not elsewhere classified: Secondary | ICD-10-CM | POA: Diagnosis not present

## 2023-12-25 DIAGNOSIS — Z96651 Presence of right artificial knee joint: Secondary | ICD-10-CM | POA: Diagnosis not present

## 2023-12-25 DIAGNOSIS — M6281 Muscle weakness (generalized): Secondary | ICD-10-CM | POA: Diagnosis not present

## 2023-12-27 ENCOUNTER — Emergency Department

## 2023-12-27 ENCOUNTER — Observation Stay
Admit: 2023-12-27 | Discharge: 2023-12-27 | Disposition: A | Discharge status: Home / Self Care | Attending: Emergency Medicine | Admitting: Emergency Medicine

## 2023-12-27 ENCOUNTER — Observation Stay

## 2023-12-27 ENCOUNTER — Observation Stay
Admission: EM | Admit: 2023-12-27 | Discharge: 2023-12-28 | Disposition: A | Discharge status: Home / Self Care | Attending: Internal Medicine | Admitting: Internal Medicine

## 2023-12-27 ENCOUNTER — Other Ambulatory Visit: Payer: Self-pay

## 2023-12-27 ENCOUNTER — Encounter: Payer: Self-pay | Admitting: Internal Medicine

## 2023-12-27 DIAGNOSIS — I959 Hypotension, unspecified: Secondary | ICD-10-CM | POA: Diagnosis not present

## 2023-12-27 DIAGNOSIS — I5020 Unspecified systolic (congestive) heart failure: Secondary | ICD-10-CM | POA: Insufficient documentation

## 2023-12-27 DIAGNOSIS — J449 Chronic obstructive pulmonary disease, unspecified: Secondary | ICD-10-CM | POA: Diagnosis not present

## 2023-12-27 DIAGNOSIS — A419 Sepsis, unspecified organism: Secondary | ICD-10-CM | POA: Diagnosis not present

## 2023-12-27 DIAGNOSIS — I428 Other cardiomyopathies: Secondary | ICD-10-CM | POA: Diagnosis not present

## 2023-12-27 DIAGNOSIS — J4520 Mild intermittent asthma, uncomplicated: Secondary | ICD-10-CM | POA: Diagnosis present

## 2023-12-27 DIAGNOSIS — I2510 Atherosclerotic heart disease of native coronary artery without angina pectoris: Secondary | ICD-10-CM | POA: Diagnosis not present

## 2023-12-27 DIAGNOSIS — R0789 Other chest pain: Secondary | ICD-10-CM | POA: Diagnosis not present

## 2023-12-27 DIAGNOSIS — G9341 Metabolic encephalopathy: Secondary | ICD-10-CM | POA: Diagnosis not present

## 2023-12-27 DIAGNOSIS — E039 Hypothyroidism, unspecified: Secondary | ICD-10-CM | POA: Diagnosis not present

## 2023-12-27 DIAGNOSIS — M7989 Other specified soft tissue disorders: Secondary | ICD-10-CM | POA: Diagnosis not present

## 2023-12-27 DIAGNOSIS — Z8616 Personal history of COVID-19: Secondary | ICD-10-CM | POA: Insufficient documentation

## 2023-12-27 DIAGNOSIS — Z86718 Personal history of other venous thrombosis and embolism: Secondary | ICD-10-CM

## 2023-12-27 DIAGNOSIS — G4733 Obstructive sleep apnea (adult) (pediatric): Secondary | ICD-10-CM | POA: Diagnosis not present

## 2023-12-27 DIAGNOSIS — Z1152 Encounter for screening for COVID-19: Secondary | ICD-10-CM | POA: Diagnosis not present

## 2023-12-27 DIAGNOSIS — K760 Fatty (change of) liver, not elsewhere classified: Secondary | ICD-10-CM | POA: Diagnosis not present

## 2023-12-27 DIAGNOSIS — I480 Paroxysmal atrial fibrillation: Secondary | ICD-10-CM | POA: Insufficient documentation

## 2023-12-27 DIAGNOSIS — R569 Unspecified convulsions: Secondary | ICD-10-CM | POA: Insufficient documentation

## 2023-12-27 DIAGNOSIS — R200 Anesthesia of skin: Secondary | ICD-10-CM | POA: Insufficient documentation

## 2023-12-27 DIAGNOSIS — Z79899 Other long term (current) drug therapy: Secondary | ICD-10-CM | POA: Insufficient documentation

## 2023-12-27 DIAGNOSIS — Z950 Presence of cardiac pacemaker: Secondary | ICD-10-CM | POA: Insufficient documentation

## 2023-12-27 DIAGNOSIS — Z95810 Presence of automatic (implantable) cardiac defibrillator: Secondary | ICD-10-CM | POA: Diagnosis not present

## 2023-12-27 DIAGNOSIS — Z95828 Presence of other vascular implants and grafts: Secondary | ICD-10-CM

## 2023-12-27 DIAGNOSIS — Z91040 Latex allergy status: Secondary | ICD-10-CM | POA: Insufficient documentation

## 2023-12-27 DIAGNOSIS — G9340 Encephalopathy, unspecified: Secondary | ICD-10-CM | POA: Diagnosis present

## 2023-12-27 DIAGNOSIS — E669 Obesity, unspecified: Secondary | ICD-10-CM

## 2023-12-27 DIAGNOSIS — I110 Hypertensive heart disease with heart failure: Secondary | ICD-10-CM | POA: Diagnosis not present

## 2023-12-27 DIAGNOSIS — G40909 Epilepsy, unspecified, not intractable, without status epilepticus: Secondary | ICD-10-CM

## 2023-12-27 DIAGNOSIS — R918 Other nonspecific abnormal finding of lung field: Secondary | ICD-10-CM | POA: Diagnosis not present

## 2023-12-27 DIAGNOSIS — R0989 Other specified symptoms and signs involving the circulatory and respiratory systems: Secondary | ICD-10-CM | POA: Diagnosis not present

## 2023-12-27 DIAGNOSIS — Z8679 Personal history of other diseases of the circulatory system: Secondary | ICD-10-CM | POA: Diagnosis present

## 2023-12-27 DIAGNOSIS — I5022 Chronic systolic (congestive) heart failure: Secondary | ICD-10-CM | POA: Diagnosis not present

## 2023-12-27 DIAGNOSIS — Z96653 Presence of artificial knee joint, bilateral: Secondary | ICD-10-CM | POA: Diagnosis not present

## 2023-12-27 DIAGNOSIS — R0602 Shortness of breath: Secondary | ICD-10-CM | POA: Diagnosis present

## 2023-12-27 DIAGNOSIS — K219 Gastro-esophageal reflux disease without esophagitis: Secondary | ICD-10-CM | POA: Diagnosis present

## 2023-12-27 DIAGNOSIS — E119 Type 2 diabetes mellitus without complications: Secondary | ICD-10-CM | POA: Diagnosis not present

## 2023-12-27 DIAGNOSIS — Z7982 Long term (current) use of aspirin: Secondary | ICD-10-CM | POA: Insufficient documentation

## 2023-12-27 DIAGNOSIS — R079 Chest pain, unspecified: Principal | ICD-10-CM | POA: Diagnosis present

## 2023-12-27 DIAGNOSIS — Z96651 Presence of right artificial knee joint: Secondary | ICD-10-CM | POA: Diagnosis not present

## 2023-12-27 DIAGNOSIS — R29818 Other symptoms and signs involving the nervous system: Secondary | ICD-10-CM | POA: Diagnosis not present

## 2023-12-27 DIAGNOSIS — M542 Cervicalgia: Secondary | ICD-10-CM | POA: Diagnosis not present

## 2023-12-27 DIAGNOSIS — N179 Acute kidney failure, unspecified: Secondary | ICD-10-CM | POA: Diagnosis not present

## 2023-12-27 LAB — BASIC METABOLIC PANEL WITH GFR
Anion gap: 9 (ref 5–15)
BUN: 12 mg/dL (ref 6–20)
CO2: 30 mmol/L (ref 22–32)
Calcium: 8.7 mg/dL — ABNORMAL LOW (ref 8.9–10.3)
Chloride: 100 mmol/L (ref 98–111)
Creatinine, Ser: 1.66 mg/dL — ABNORMAL HIGH (ref 0.44–1.00)
GFR, Estimated: 36 mL/min — ABNORMAL LOW (ref >60)
Glucose, Bld: 88 mg/dL (ref 70–99)
Potassium: 3.8 mmol/L (ref 3.5–5.1)
Sodium: 139 mmol/L (ref 135–145)

## 2023-12-27 LAB — HEPATIC FUNCTION PANEL
ALT: 19 U/L (ref 0–44)
AST: 28 U/L (ref 15–41)
Albumin: 3.6 g/dL (ref 3.5–5.0)
Alkaline Phosphatase: 76 U/L (ref 38–126)
Bilirubin, Direct: <0.1 mg/dL (ref 0.0–0.2)
Total Bilirubin: 0.5 mg/dL (ref 0.0–1.2)
Total Protein: 7.3 g/dL (ref 6.5–8.1)

## 2023-12-27 LAB — CBC WITH DIFFERENTIAL/PLATELET
Abs Immature Granulocytes: 0.16 10*3/uL — ABNORMAL HIGH (ref 0.00–0.07)
Basophils Absolute: 0.1 10*3/uL (ref 0.0–0.1)
Basophils Relative: 1 %
Eosinophils Absolute: 0.4 10*3/uL (ref 0.0–0.5)
Eosinophils Relative: 4 %
HCT: 31.7 % — ABNORMAL LOW (ref 36.0–46.0)
Hemoglobin: 9.9 g/dL — ABNORMAL LOW (ref 12.0–15.0)
Immature Granulocytes: 2 %
Lymphocytes Relative: 21 %
Lymphs Abs: 2 10*3/uL (ref 0.7–4.0)
MCH: 28.1 pg (ref 26.0–34.0)
MCHC: 31.2 g/dL (ref 30.0–36.0)
MCV: 90.1 fL (ref 80.0–100.0)
Monocytes Absolute: 0.8 10*3/uL (ref 0.1–1.0)
Monocytes Relative: 8 %
Neutro Abs: 6.2 10*3/uL (ref 1.7–7.7)
Neutrophils Relative %: 64 %
Platelets: 272 10*3/uL (ref 150–400)
RBC: 3.52 MIL/uL — ABNORMAL LOW (ref 3.87–5.11)
RDW: 16.3 % — ABNORMAL HIGH (ref 11.5–15.5)
WBC: 9.7 10*3/uL (ref 4.0–10.5)
nRBC: 0 % (ref 0.0–0.2)

## 2023-12-27 LAB — CBC
HCT: 30.9 % — ABNORMAL LOW (ref 36.0–46.0)
Hemoglobin: 9.7 g/dL — ABNORMAL LOW (ref 12.0–15.0)
MCH: 27.7 pg (ref 26.0–34.0)
MCHC: 31.4 g/dL (ref 30.0–36.0)
MCV: 88.3 fL (ref 80.0–100.0)
Platelets: 268 10*3/uL (ref 150–400)
RBC: 3.5 MIL/uL — ABNORMAL LOW (ref 3.87–5.11)
RDW: 16.4 % — ABNORMAL HIGH (ref 11.5–15.5)
WBC: 9.4 10*3/uL (ref 4.0–10.5)
nRBC: 0 % (ref 0.0–0.2)

## 2023-12-27 LAB — LACTIC ACID, PLASMA
Lactic Acid, Venous: 2.1 mmol/L (ref 0.5–1.9)
Lactic Acid, Venous: 2.8 mmol/L (ref 0.5–1.9)
Lactic Acid, Venous: 1.9 mmol/L (ref 0.5–1.9)
Lactic Acid, Venous: 2.2 mmol/L (ref 0.5–1.9)
Lactic Acid, Venous: 1.6 mmol/L (ref 0.5–1.9)

## 2023-12-27 LAB — URINALYSIS, W/ REFLEX TO CULTURE (INFECTION SUSPECTED)
Bacteria, UA: NONE SEEN
Bilirubin Urine: NEGATIVE
Glucose, UA: NEGATIVE mg/dL
Hgb urine dipstick: NEGATIVE
Ketones, ur: NEGATIVE mg/dL
Leukocytes,Ua: NEGATIVE
Nitrite: NEGATIVE
Protein, ur: NEGATIVE mg/dL
Specific Gravity, Urine: 1.006 (ref 1.005–1.030)
Squamous Epithelial / HPF: 0 /HPF (ref 0–5)
pH: 5 (ref 5.0–8.0)

## 2023-12-27 LAB — PROTIME-INR
INR: 1 (ref 0.8–1.2)
INR: 1 (ref 0.8–1.2)
Prothrombin Time: 13.7 s (ref 11.4–15.2)
Prothrombin Time: 13.6 s (ref 11.4–15.2)

## 2023-12-27 LAB — BRAIN NATRIURETIC PEPTIDE: B Natriuretic Peptide: 18.2 pg/mL (ref 0.0–100.0)

## 2023-12-27 LAB — RESP PANEL BY RT-PCR (RSV, FLU A&B, COVID)  RVPGX2
Influenza A by PCR: NEGATIVE
Influenza B by PCR: NEGATIVE
Resp Syncytial Virus by PCR: NEGATIVE
SARS Coronavirus 2 by RT PCR: NEGATIVE

## 2023-12-27 LAB — TROPONIN I (HIGH SENSITIVITY)
Troponin I (High Sensitivity): 5 ng/L (ref <18)
Troponin I (High Sensitivity): 3 ng/L (ref <18)

## 2023-12-27 LAB — TYPE AND SCREEN
ABO/RH(D): O NEG
Antibody Screen: NEGATIVE

## 2023-12-27 LAB — MAGNESIUM: Magnesium: 2 mg/dL (ref 1.7–2.4)

## 2023-12-27 LAB — PROCALCITONIN: Procalcitonin: <0.1 ng/mL

## 2023-12-27 LAB — D-DIMER, QUANTITATIVE: D-Dimer, Quant: 2.89 ug{FEU}/mL — ABNORMAL HIGH (ref 0.00–0.50)

## 2023-12-27 MED ORDER — ACETAMINOPHEN 325 MG PO TABS: 650.0000 mg | ORAL_TABLET | ORAL | Status: DC | PRN

## 2023-12-27 MED ORDER — TECHNETIUM TO 99M ALBUMIN AGGREGATED
4.0000 | Freq: Once | INTRAVENOUS | Status: AC | PRN
  Administered 2023-12-27: 4.44 via INTRAVENOUS

## 2023-12-27 MED ORDER — ACETAMINOPHEN 10 MG/ML IV SOLN
1000.0000 mg | Freq: Once | INTRAVENOUS | Status: AC
  Administered 2023-12-27: 1000 mg via INTRAVENOUS
  Filled 2023-12-27: qty 100

## 2023-12-27 MED ORDER — ASPIRIN 81 MG PO TBEC
81.0000 mg | DELAYED_RELEASE_TABLET | Freq: Every day | ORAL | Status: DC
  Administered 2023-12-27 – 2023-12-28 (×2): 81 mg via ORAL
  Filled 2023-12-27 (×2): qty 1

## 2023-12-27 MED ORDER — VANCOMYCIN HCL IN DEXTROSE 1-5 GM/200ML-% IV SOLN
1000.0000 mg | Freq: Once | INTRAVENOUS | Status: AC
  Administered 2023-12-27: 1000 mg via INTRAVENOUS
  Filled 2023-12-27: qty 200

## 2023-12-27 MED ORDER — ENOXAPARIN SODIUM 100 MG/ML IJ SOSY
100.0000 mg | PREFILLED_SYRINGE | Freq: Two times a day (BID) | INTRAMUSCULAR | Status: DC
  Administered 2023-12-27: 100 mg via SUBCUTANEOUS
  Filled 2023-12-27 (×2): qty 1

## 2023-12-27 MED ORDER — ENOXAPARIN SODIUM 60 MG/0.6ML IJ SOSY: 0.5000 mg/kg | PREFILLED_SYRINGE | INTRAMUSCULAR | Status: DC

## 2023-12-27 MED ORDER — FENTANYL CITRATE PF 50 MCG/ML IJ SOSY
50.0000 ug | PREFILLED_SYRINGE | Freq: Once | INTRAMUSCULAR | Status: AC
  Administered 2023-12-27: 50 ug via INTRAVENOUS
  Filled 2023-12-27: qty 1

## 2023-12-27 MED ORDER — PROCHLORPERAZINE EDISYLATE 10 MG/2ML IJ SOLN
10.0000 mg | Freq: Once | INTRAMUSCULAR | Status: AC
  Administered 2023-12-27: 10 mg via INTRAVENOUS
  Filled 2023-12-27: qty 2

## 2023-12-27 MED ORDER — ENOXAPARIN SODIUM 60 MG/0.6ML IJ SOSY
50.0000 mg | PREFILLED_SYRINGE | INTRAMUSCULAR | Status: DC
  Administered 2023-12-28: 50 mg via SUBCUTANEOUS
  Filled 2023-12-27: qty 0.6

## 2023-12-27 MED ORDER — SODIUM CHLORIDE 0.9 % IV SOLN: INTRAVENOUS | Status: AC

## 2023-12-27 MED ORDER — METOPROLOL SUCCINATE ER 50 MG PO TB24
50.0000 mg | ORAL_TABLET | Freq: Every day | ORAL | Status: DC
  Administered 2023-12-28: 50 mg via ORAL
  Filled 2023-12-27: qty 1

## 2023-12-27 MED ORDER — STROKE: EARLY STAGES OF RECOVERY BOOK
Freq: Once | Status: DC
Start: 2023-12-28 — End: 2023-12-28

## 2023-12-27 MED ORDER — ACETAMINOPHEN 650 MG RE SUPP: 650.0000 mg | RECTAL | Status: DC | PRN

## 2023-12-27 MED ORDER — ACETAMINOPHEN 160 MG/5ML PO SOLN: 650.0000 mg | ORAL | Status: DC | PRN

## 2023-12-27 MED ORDER — LACTATED RINGERS IV SOLN: INTRAVENOUS | Status: AC

## 2023-12-27 MED ORDER — MIDODRINE HCL 5 MG PO TABS
10.0000 mg | ORAL_TABLET | Freq: Once | ORAL | Status: DC
  Filled 2023-12-27: qty 2

## 2023-12-27 MED ORDER — METRONIDAZOLE 500 MG/100ML IV SOLN
500.0000 mg | Freq: Once | INTRAVENOUS | Status: AC
  Administered 2023-12-27: 500 mg via INTRAVENOUS
  Filled 2023-12-27: qty 100

## 2023-12-27 MED ORDER — LACTATED RINGERS IV BOLUS (SEPSIS)
1000.0000 mL | Freq: Once | INTRAVENOUS | Status: AC
  Administered 2023-12-27: 1000 mL via INTRAVENOUS

## 2023-12-27 MED ORDER — SODIUM CHLORIDE 0.9 % IV SOLN
2.0000 g | Freq: Once | INTRAVENOUS | Status: AC
  Administered 2023-12-27: 2 g via INTRAVENOUS
  Filled 2023-12-27: qty 10

## 2023-12-27 NOTE — ED Notes (Signed)
 CCMD called to initiate cardiac monitoring

## 2023-12-27 NOTE — Assessment & Plan Note (Signed)
 Stable from a resp standpoint  Cont home inhalers

## 2023-12-27 NOTE — Assessment & Plan Note (Signed)
 Cont home vimpat  and neurontin 

## 2023-12-27 NOTE — H&P (Addendum)
 History and Physical    Patient: Brittany Nguyen WJX:[id number] DOB: November 13, 1965 DOA: 12/27/2023 DOS: the patient was seen and examined on 12/27/2023 PCP: Lyle San, MD  Patient coming from: Home  Chief Complaint:  Chief Complaint  Patient presents with   Chest Pain   HPI: Brittany Nguyen is a 58 y.o. female with medical history significant of hypothyroid, anxiety, hypertension, restless leg syndrome, history of DVT status post IVC filter placement, CAD status post AICD, seizure, hypertension, COPD, anxiety presenting with hypotension, chest pain, left arm weakness.  Patient reports waking up with left-sided arm weakness.  No slurred speech or confusion.  Weakness waxing waning.  Also with central chest pain.  Chest pain fairly constant.  Mild to moderate in intensity.  Minimal to mild shortness of breath.  No abdominal pain.  No nausea or vomiting.  Noted to have been admitted April 2 through April for 3 issues including hypotension related to decreased p.o. intake as well as hypovolemia from recent surgery.  Medications were held.  Patient states that she retook her diuretic medication within the past 4 to 5 days.  Positive worsening weakness.  P.o. intake has been somewhat diminished.  Denies any prior history of stroke in the past.  No reported seizure activity or generalized shaking of the left upper extremity. Presented to the ER afebrile, heart rate 50s to 60s, initial blood pressures in the 70s.  Improved to the 110s with IV fluid hydration.  CT head and chest x-ray stable.  CT chest abdomen pelvis grossly stable apart from subacute fractures of the right 4th, 5th and 6th as well as seventh ribs.  Tiny nodule within the right lower lobe measuring 3 mm.  Troponin negative x 2.  EKG sinus rhythm with first-degree AV block. Review of Systems: As mentioned in the history of present illness. All other systems reviewed and are negative. Past Medical History:  Diagnosis Date   Acute  anterolateral wall MI (HCC) 08/2016   Acute respiratory failure (HCC)    AICD (automatic cardioverter/defibrillator) present 03/29/2017   a.) Medtronic Evera MRI XT DR SureScan (SN: [id number])   Anxiety    a.) on BZO PRN (lorazepam )   Arthritis    Asthma 2013   Breast cancer, right (HCC)    C. 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   a.) Bx 02/22/2018 --> NE tumor cells --> AE1/AE3, chromogranin, synaptophysin (+); Ki-67 proliferation rate <1%; b.) repeat Bx 08/24/2018 --> well differentiard NET (G1)   CHF (congestive heart failure) (HCC)    Chronic, continuous use of opioids    a.) has naloxone  Rx available   COPD (chronic obstructive pulmonary disease) (HCC)    Coronary artery disease    Depression    Diet-controlled type 2 diabetes mellitus (HCC)    Diverticulitis 2010   DVT (deep venous thrombosis) (HCC)    Endometriosis 1990   Generalized epilepsy (HCC)    a.) on lacosamide ; b.) seizure 07-30-22 in setting of hypokalemia (K+ 2.6); c.) last seizure 11/13/2023 during blood draw   GERD (gastroesophageal reflux disease)    GIB (gastrointestinal bleeding) 01/08/2018   H/O syncope    HFrEF (heart failure with reduced ejection fraction) (HCC)    a.)TTE 12/09/16: EF 25-30%, dif HK; b.) TTE 02/08/17: EF 25%, dif HK, mild TR, G1DD; c.) TTE 04/25/17: EF 30-35%, mild LVH; d.) TTE 01/30/18: EF 45%, MAC, LAE, mild MR, G1DD; e.) TTE 11/06/2018: EF 25%, sev glob HK, triv MR/TR/PR;  f.) TTE 05/12/19: EF 35%, MAC, AoV sclerosis, G1DD; g.) TTE 11/05/19: EF 20%, sev glob HK, triv MR; h.) TTE 06/17/20: EF 40%, mod glob HK, triv MR; I:) TTE 06/14/22: EF 25-30%, G1DD   History of cardiac catheterization    a.) LHC 10/21/2014: normal coronaries; b.) R/LHC 02/24/2017: normal coronaries, mRA 12, mPA 28, mPCWP 15, CO 8.0, CI 3.8   History of kidney stones    Hx MRSA infection    Hypertension    Hypokalemia    Hypothyroidism    a.) s/p thyroidectomy   Incomplete  left bundle branch block (LBBB)    Iron  deficiency anemia 03/27/2018   Lump or mass in breast 11/27/2012   a.) Bx 11/27/2012 --> apocrine wall cyst   Migraine    Nausea & vomiting    Neuropathy    Non-ischemic cardiomyopathy (HCC)    a.) TTE 12/09/2016: EF 25-30%, b.) TTE 02/08/2017: EF 25%; c.) Medtronic AICD placed 03/29/2017; d.) TTE 04/25/2017: EF 30-35%; e.) TTE 01/30/2018: EF 45%; f.) TTE 11/06/2018: EF 25%; g.) TTE 05/12/2019: EF 35%; h.) TTE 11/05/2019: EF 20%; I.) TTE 06/17/2020: EF 40%; J.) TTE 06/14/2022: EF 25-30%   Obesity    OSA on CPAP    PAF (paroxysmal atrial fibrillation) (HCC)    Pneumonia    PONV (postoperative nausea and vomiting)    Post-COVID chronic cough    Postcholecystectomy diarrhea    Prolonged Q-T interval on ECG    RAD (reactive airway disease)    Restless leg    a.) on ropinirole    Sepsis (HCC)    Unstable angina (HCC)    Past Surgical History:  Procedure Laterality Date   ABDOMINAL HYSTERECTOMY     age 58   BREAST BIOPSY Right 2014   benign   BREAST BIOPSY Right 01/18/2023   Us  Core Bx, coil clip - path pending   BREAST BIOPSY Right 01/18/2023   US  RT BREAST BX W LOC DEV 1ST LESION IMG BX SPEC US  GUIDE 01/18/2023 [facility]   CARDIAC DEFIBRILLATOR PLACEMENT Left 03/29/2017   Procedure: MEDTRONIC CARDIAC DEFIBRILLATOR PLACEMENT (AICD); Location: [facility]; Surgeon: Mira Amend, MD   CESAREAN SECTION     CHOLECYSTECTOMY     COLECTOMY     COLONOSCOPY WITH PROPOFOL  N/A 02/22/2018   Procedure: COLONOSCOPY WITH PROPOFOL ;  Surgeon: Toledo, Alphonsus Jeans, MD;  Location: [facility];  Service: Gastroenterology;  Laterality: N/A;   ESOPHAGOGASTRODUODENOSCOPY (EGD) WITH PROPOFOL  N/A 02/22/2018   Procedure: ESOPHAGOGASTRODUODENOSCOPY (EGD) WITH PROPOFOL ;  Surgeon: Toledo, Alphonsus Jeans, MD;  Location: [facility];  Service: Gastroenterology;  Laterality: N/A;   IVC FILTER INSERTION N/A 08/23/2022   Procedure: IVC FILTER INSERTION;  Surgeon: Jackquelyn Mass, MD;  Location: [facility];  Service: Cardiovascular;  Laterality: N/A;   IVC FILTER REMOVAL N/A 03/07/2023   Procedure: IVC FILTER REMOVAL;  Surgeon: Jackquelyn Mass, MD;  Location: [facility];  Service: Cardiovascular;  Laterality: N/A;   KNEE ARTHROPLASTY Left 08/24/2022   Procedure: COMPUTER ASSISTED TOTAL KNEE ARTHROPLASTY;  Surgeon: Arlyne Lame, MD;  Location: [facility];  Service: Orthopedics;  Laterality: Left;   KNEE ARTHROPLASTY Right 12/01/2023   Procedure: COMPUTER ASSISTED TOTAL KNEE ARTHROPLASTY;  Surgeon: Arlyne Lame, MD;  Location: [facility];  Service: Orthopedics;  Laterality: Right;   LEFT HEART CATH AND CORONARY ANGIOGRAPHY Left 10/21/2014   Procedure: LEFT HEART CATH AND CORONARY ANGIOGRAPHY; Location: [facility]; Surgeon: Thomasene Flemings, MD   NASAL SINUS SURGERY  2012   OOPHORECTOMY  RIGHT/LEFT HEART CATH AND CORONARY ANGIOGRAPHY Bilateral 02/24/2017   Procedure: RIGHT/LEFT HEART CATH AND CORONARY ANGIOGRAPHY; Location: [facility]   THYROIDECTOMY     Social History:  reports that she has never smoked. She has never used smokeless tobacco. She reports that she does not drink alcohol and does not use drugs.  Allergies  Allergen Reactions   Contrast Media [Iodinated Contrast Media] Shortness Of Breath    Per CT report in 2014 pt had break through contrast reaction with hives and SOB following 13 hour prep. [facility]    Ferumoxytol  Nausea Only   Iron  Sucrose Anaphylaxis   Lidocaine  Hives   Metrizamide Shortness Of Breath   Penicillins Hives and Other (See Comments)    Tolerated ceftriaxone  in past IgE = 4 (WNL) on 08/12/2022  ediate rash, facial/tongue/throat swelling, SOB or lightheadedness with hypotension, tachy   Sacubitril-Valsartan     Sz (unclear if new start entresto vs hypoglycemia)   Isosorbide Nitrate Other (See Comments)    Headache   Latex Hives    IgE < 0.10 (WNL) on 08/21/2023   Ondansetron  Other (See Comments)    Severe  headache   Tizanidine  Other (See Comments)    Feels altered   Povidone-Iodine  Rash    Blistering rash   Pulmicort  [Budesonide ] Itching    Family History  Problem Relation Age of Onset   Breast cancer Mother    Cancer Mother 60       ovarian   CAD Mother    Cancer Father 74       brain   CAD Father    Cancer Daughter 24       skin   Breast cancer Maternal Aunt    Cancer Maternal Aunt 34       breast   Leukemia Paternal Grandfather    Breast cancer Cousin     Prior to Admission medications   Medication Sig Start Date End Date Taking? Authorizing Provider  aspirin  81 MG chewable tablet Chew 1 tablet (81 mg total) by mouth 2 (two) times daily. 12/02/23  Yes Rojelio Clement, PA-C  celecoxib  (CELEBREX ) 200 MG capsule Take 200 mg by mouth 2 (two) times daily. 12/03/23  Yes [provider]  gabapentin  (NEURONTIN ) 300 MG capsule Take 1 capsule (300 mg total) by mouth 3 (three) times daily. 10/08/23  Yes Veronica Gordon, MD  lacosamide  (VIMPAT ) 200 MG TABS tablet Take 1 tablet (200 mg total) by mouth 2 (two) times daily. 09/27/23 12/27/23 Yes [place], Rema Care, DO  levothyroxine  (SYNTHROID ) 175 MCG tablet Take 1 tablet (175 mcg total) by mouth daily at 6 (six) AM. 09/28/23 12/27/23 Yes [place], Rema Care, DO  LORazepam  (ATIVAN ) 1 MG tablet Take 1 tablet (1 mg total) by mouth 4 (four) times daily as needed. 11/10/21  Yes Luna Salinas, MD  losartan  (COZAAR ) 25 MG tablet Take 12.5 mg by mouth 2 (two) times daily. 12/08/23  Yes [provider]  methadone  (DOLOPHINE ) 5 MG tablet Take 5 mg by mouth See admin instructions. Patient takes 1.5 tabs (7.5 mg) every morning, and 1.5 tabs (7.5 mg) midday,  and 2 tabs (10 mg) at bedtime.   Yes [provider]  metoprolol  succinate (TOPROL -XL) 50 MG 24 hr tablet Take 50 mg by mouth daily. 12/08/23  Yes [provider]  oxyCODONE -acetaminophen  (PERCOCET) 10-325 MG tablet Take 1 tablet by mouth every 4 (four) hours as needed for  pain.   Yes [provider]  Potassium Chloride  ER 20 MEQ TBCR  Take 20 mEq by mouth daily. 12/08/23  Yes [provider]  promethazine  (PHENERGAN ) 25 MG tablet Take 25 mg by mouth every 4 (four) hours as needed for nausea or vomiting.   Yes [provider]  promethazine -dextromethorphan  (PROMETHAZINE -DM) 6.25-15 MG/5ML syrup Take 5 mLs by mouth every 6 (six) hours as needed for cough. 12/14/23  Yes [provider]  rOPINIRole  (REQUIP ) 4 MG tablet Take 4 mg by mouth at bedtime. 01/30/19  Yes [provider]  tiZANidine  (ZANAFLEX ) 4 MG capsule Take 4 mg by mouth 3 (three) times daily as needed for muscle spasms.   Yes [provider]  torsemide  (DEMADEX ) 20 MG tablet Take 20 mg by mouth daily. 12/08/23  Yes [provider]  escitalopram  (LEXAPRO ) 10 MG tablet Take 10 mg by mouth daily. Patient not taking: Reported on 12/27/2023 11/24/22   [provider]  spironolactone  (ALDACTONE ) 50 MG tablet Take 50 mg by mouth 2 (two) times daily. Patient not taking: Reported on 12/27/2023 12/08/23   [provider]    Physical Exam: Vitals:   12/27/23 0900 12/27/23 0915 12/27/23 0930 12/27/23 0948  BP: 103/73 93/67 115/79   Pulse: 64 (!) 59 61   Resp: 14 12 11    Temp:    98 F (36.7 C)  TempSrc:    Oral  SpO2: 100% 99% 98%   Weight:      Height:       Physical Exam Constitutional:      Appearance: She is obese.     Comments: + generalized lethargy    HENT:     Head: Normocephalic and atraumatic.     Nose: Nose normal.  Eyes:     Pupils: Pupils are equal, round, and reactive to light.  Cardiovascular:     Rate and Rhythm: Normal rate and regular rhythm.  Pulmonary:     Effort: Pulmonary effort is normal.  Abdominal:     General: Bowel sounds are normal.  Musculoskeletal:        General: Normal range of motion.  Skin:    General: Skin is warm.  Neurological:     General: No focal deficit present.     Comments: +  generalized lethargy  Nonfocal neuro exam    Psychiatric:        Mood and Affect: Mood normal.     Data Reviewed:  There are no new results to review at this time.  CT CHEST ABDOMEN PELVIS WO CONTRAST CLINICAL DATA:  Septicemia. Chest pain radiating to left arm and neck.  EXAM: CT CHEST, ABDOMEN AND PELVIS WITHOUT CONTRAST  TECHNIQUE: Multidetector CT imaging of the chest, abdomen and pelvis was performed following the standard protocol without IV contrast.  RADIATION DOSE REDUCTION: This exam was performed according to the departmental dose-optimization program which includes automated exposure control, adjustment of the mA and/or kV according to patient size and/or use of iterative reconstruction technique.  COMPARISON:  12/06/2023  FINDINGS: CT CHEST FINDINGS  Cardiovascular: Left chest wall ICD with leads in the right ventricle and cavoatrial junction. Heart size is mildly enlarged. No pericardial effusion.  Mediastinum/Nodes: No enlarged mediastinal, hilar, or axillary lymph nodes. Thyroid  gland, trachea, and esophagus demonstrate no significant findings.  Lungs/Pleura: Central airways are patent. No pleural effusion, airspace consolidation, atelectasis or pneumothorax. Dependent changes are noted along the posterior lower lobes no atelectasis or pneumothorax. Stable tiny nodule within the posterior right lower lobe measuring 3 mm, image 104/4.  Musculoskeletal: Subacute fracture deformities involving the  right fourth, fifth, sixth, and seventh ribs are again noted and appear unchanged. No acute findings.  CT ABDOMEN PELVIS FINDINGS  Hepatobiliary: Mild steatosis within the liver. No focal liver abnormality. Status post cholecystectomy. No bile duct dilatation.  Pancreas: Unremarkable. No pancreatic ductal dilatation or surrounding inflammatory changes.  Spleen: Normal in size without focal abnormality.  Adrenals/Urinary Tract: Normal adrenal glands.  No nephrolithiasis, hydronephrosis or mass. Urinary bladder appears within normal limits.  Stomach/Bowel: Stomach is normal. No pathologic dilatation of the large or small bowel loops. Sigmoid anastomosis identified within the pelvis. The appendix is not visualized and may be surgically absent. No secondary signs of acute appendicitis.  Vascular/Lymphatic: Normal appearance of the abdominal aorta. IVC filter in place. No signs of abdominopelvic adenopathy.  Reproductive: Status post hysterectomy. No adnexal masses.  Other: No free fluid or fluid collections. No signs of pneumoperitoneum.  Musculoskeletal: No acute or significant osseous findings.  IMPRESSION: 1. No acute findings within the chest, abdomen or pelvis. 2. Subacute fracture deformities involving the right fourth, fifth, sixth, and seventh ribs are again noted and appear unchanged. 3. Mild hepatic steatosis. 4. Stable tiny nodule within the posterior right lower lobe measuring 3 mm. If the patient is at high risk for bronchogenic carcinoma, follow-up chest CT at 1 year is recommended. If the patient is at low risk, no follow-up is needed. This recommendation follows the consensus statement: Guidelines for Management of Small Pulmonary Nodules Detected on CT Scans: A Statement from the Fleischner Society as published in Radiology 2005; 237:395-400.  Electronically Signed   By: Kimberley Penman M.D.   On: 12/27/2023 08:31 CT HEAD WO CONTRAST CLINICAL DATA:  Neuro deficit with stroke suspected.  Hypotension  EXAM: CT HEAD WITHOUT CONTRAST  TECHNIQUE: Contiguous axial images were obtained from the base of the skull through the vertex without intravenous contrast.  RADIATION DOSE REDUCTION: This exam was performed according to the departmental dose-optimization program which includes automated exposure control, adjustment of the mA and/or kV according to patient size and/or use of iterative reconstruction  technique.  COMPARISON:  12/06/2023  FINDINGS: Brain: No evidence of acute infarction, hemorrhage, hydrocephalus, extra-axial collection or mass lesion/mass effect.  Vascular: No hyperdense vessel or unexpected calcification.  Skull: Normal. Negative for fracture or focal lesion.  Sinuses/Orbits: Prior endoscopic sinus surgery. A small left sphenoid sinus fluid level is again seen, with patent ostium.  IMPRESSION: Stable, normal appearance of the brain.  Electronically Signed   By: Ronnette Coke M.D.   On: 12/27/2023 06:26 DG Chest Port 1 View CLINICAL DATA:  Chest pain  EXAM: PORTABLE CHEST 1 VIEW  COMPARISON:  12/06/2023  FINDINGS: Dual-chamber ICD/pacer leads in stable position. Chronic cardiopericardial enlargement and low lung volumes. There is no edema, consolidation, effusion, or pneumothorax.  IMPRESSION: Stable exam.  No evidence of acute disease.  Electronically Signed   By: Ronnette Coke M.D.   On: 12/27/2023 05:30  Lab Results  Component Value Date   WBC 9.4 12/27/2023   HGB 9.7 (L) 12/27/2023   HCT 30.9 (L) 12/27/2023   MCV 88.3 12/27/2023   PLT 268 12/27/2023   Last metabolic panel Lab Results  Component Value Date   GLUCOSE 88 12/27/2023   NA 139 12/27/2023   K 3.8 12/27/2023   CL 100 12/27/2023   CO2 30 12/27/2023   BUN 12 12/27/2023   CREATININE 1.66 (H) 12/27/2023   GFRNONAA 36 (L) 12/27/2023   CALCIUM  8.7 (L) 12/27/2023   PHOS 4.7 (H) 09/26/2023  PROT 7.3 12/27/2023   ALBUMIN  3.6 12/27/2023   BILITOT 0.5 12/27/2023   ALKPHOS 76 12/27/2023   AST 28 12/27/2023   ALT 19 12/27/2023   ANIONGAP 9 12/27/2023    Assessment and Plan: Encephalopathy Mild generalized lethargy on presentation Suspect likely multifactorial with contributions of multiple sedating medications No overt infection noted at on exam Noted transient left-sided arm weakness that has now resolved CT head within normal limits Pending formal TIA evaluation  but with contraindication to MRI imaging in the setting of pacemaker placement as well as contrasted studies with severe prior allergy Will otherwise monitor for now Neurology consultation as appropriate  Hypotension Systolic pressures 70s to 80s on presentation Noted recurring issue with recent admission for similar issues April 2 through April 4 Suspect likely secondary to diuretic use with decreased p.o. intake as well as antihypertensives BP improving with IV fluids Will hold offending medications 2D echo in the setting of nonischemic cardiomyopathy Continue MIVF  Monitor volume status in setting of HFrEF  Follow     Chest pain Nonspecific central chest pain on presentation Noted baseline CAD Troponin negative x 2 EKG grossly stable Noted subacute rib fractures on imaging may be a confounding issue D dimer 2.9  VQ scan pending to rule out PE-will empirically cover with treatment dose lovenox  pending imaging  Pain control Monitor  OSA on CPAP CPAP    Hypothyroidism Continue synthroid     Type 2 diabetes mellitus without complication (HCC) Blood sugars in 80s SSI  Monitor   Recurrent seizures (HCC) Cont home vimpat  and neurontin     COPD (chronic obstructive pulmonary disease) (HCC) Stable from a respiratory standpoint    CAD (coronary artery disease) Baseline history of CAD Noted concurrent nonspecific chest pain Troponin negative x 2 EKG grossly stable Continue home regimen Monitor  HFrEF (heart failure with reduced ejection fraction) (HCC) 2D ECHO 01/2018 w/ EF 45% and grade 1 diastolic dysfunction  Appears fairly euvolemic at present Will check 2D echo x 1 in the setting of recurrent hypotension Consult cardiology for further management Follow  Mild intermittent asthma without complication Stable from a resp standpoint  Cont home inhalers  History of DVT (deep vein thrombosis) Noted prior hx/o DVT s/p IVC filter placement  LE u/s and VQ scan  pending pending  On treatment dose lovenox  in the interim   GERD PPI      Advance Care Planning:   Code Status: Full Code   Consults: Cardiology   Family Communication: No family at the bedside   Severity of Illness: The appropriate patient status for this patient is OBSERVATION. Observation status is judged to be reasonable and necessary in order to provide the required intensity of service to ensure the patient's safety. The patient's presenting symptoms, physical exam findings, and initial radiographic and laboratory data in the context of their medical condition is felt to place them at decreased risk for further clinical deterioration. Furthermore, it is anticipated that the patient will be medically stable for discharge from the hospital within 2 midnights of admission.   Author: Corrinne Din, MD 12/27/2023 9:54 AM  For on call review [email or web address].

## 2023-12-27 NOTE — Assessment & Plan Note (Addendum)
 Nonspecific central chest pain on presentation Noted baseline CAD Troponin negative x 2 EKG grossly stable Noted subacute rib fractures on imaging may be a confounding issue D dimer 2.9  VQ scan pending to rule out PE-will empirically cover with treatment dose lovenox  pending imaging  Pain control Monitor

## 2023-12-27 NOTE — Assessment & Plan Note (Signed)
 PPI ?

## 2023-12-27 NOTE — Progress Notes (Signed)
 PT Screen Note  Patient Details Name: Brittany Nguyen MRN: [id number] DOB: Apr 26, 1966   Cancelled Treatment:    Reason Eval/Treat Not Completed: PT screened, no needs identified, will sign off Chart reviewed, case discussed with care team.  On arrival pt reports that she is feeling "completely back to normal" and that she has no residual L UE weakness, no issues with coordination using phone, etc.  She reports no other issues with visual, verbal, mobility or other concerns.  Pt's husband present and confirms she appears at her normal in all aspects.  OT eval earlier reveals as much, nursing confirms that she was up and walking w/o issue earlier.  Pt does not require further PT intervention, will sign off at this time - no needs.  Darice Edelman, DPT 12/27/2023, 3:42 PM

## 2023-12-27 NOTE — ED Provider Notes (Signed)
 [facility] Provider Note    Event Date/Time   First MD Initiated Contact with Patient 12/27/23 [phone]     (approximate)   History   Chest Pain   HPI  Bailynn Dyk is a 58 y.o. female with history of CHF status post pacemaker/defibrillator, COPD, DVT status post IVC filter no longer on anticoagulation, paroxysmal atrial fibrillation, hypertension on losartan  and metoprolol , diabetes, chronic pain on oxycodone , methadone , gabapentin , epilepsy on Vimpat  who presents to the emergency department multiple complaints.  She reports she went to bed around 9 PM last night in her normal state of health.  Woke up this morning with complaints of diffuse headache, left arm pain, left arm and leg numbness, left arm weakness, left-sided chest pain.  Denies fevers, cough, congestion, shortness of breath, vomiting, diarrhea, dysuria, hematuria.  No prior history of stroke.  No head injury.  Does have history of migraines but states that this feels different.  She denies that it was a sudden onset, thunderclap headache.  No neck pain or neck stiffness.  Patient is hypotensive here.  She was admitted to the hospital at the beginning of April for similar and was taken off of her metoprolol  and losartan  but she states she recently restarted these medications on direction of her cardiologist.   History provided by patient, EMS.    Past Medical History:  Diagnosis Date   Acute anterolateral wall MI (HCC) 08/2016   Acute respiratory failure (HCC)    AICD (automatic cardioverter/defibrillator) present 03/29/2017   a.) Medtronic Evera MRI XT DR SureScan (SN: [id number])   Anxiety    a.) on BZO PRN (lorazepam )   Arthritis    Asthma 2013   Breast cancer, right (HCC)    C. 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   a.) Bx 02/22/2018 --> NE tumor cells --> AE1/AE3, chromogranin, synaptophysin (+); Ki-67 proliferation rate <1%; b.)  repeat Bx 08/24/2018 --> well differentiard NET (G1)   CHF (congestive heart failure) (HCC)    Chronic, continuous use of opioids    a.) has naloxone  Rx available   COPD (chronic obstructive pulmonary disease) (HCC)    Coronary artery disease    Depression    Diet-controlled type 2 diabetes mellitus (HCC)    Diverticulitis 2010   DVT (deep venous thrombosis) (HCC)    Endometriosis 1990   Generalized epilepsy (HCC)    a.) on lacosamide ; b.) seizure 07-30-22 in setting of hypokalemia (K+ 2.6); c.) last seizure 11/13/2023 during blood draw   GERD (gastroesophageal reflux disease)    GIB (gastrointestinal bleeding) 01/08/2018   H/O syncope    HFrEF (heart failure with reduced ejection fraction) (HCC)    a.)TTE 12/09/16: EF 25-30%, dif HK; b.) TTE 02/08/17: EF 25%, dif HK, mild TR, G1DD; c.) TTE 04/25/17: EF 30-35%, mild LVH; d.) TTE 01/30/18: EF 45%, MAC, LAE, mild MR, G1DD; e.) TTE 11/06/2018: EF 25%, sev glob HK, triv MR/TR/PR; f.) TTE 05/12/19: EF 35%, MAC, AoV sclerosis, G1DD; g.) TTE 11/05/19: EF 20%, sev glob HK, triv MR; h.) TTE 06/17/20: EF 40%, mod glob HK, triv MR; I:) TTE 06/14/22: EF 25-30%, G1DD   History of cardiac catheterization    a.) LHC 10/21/2014: normal coronaries; b.) [facility] 02/24/2017: normal coronaries, mRA 12, mPA 28, mPCWP 15, CO 8.0, CI 3.8   History of kidney stones    Hx MRSA infection    Hypertension    Hypokalemia    Hypothyroidism  a.) s/p thyroidectomy   Incomplete left bundle branch block (LBBB)    Iron  deficiency anemia 03/27/2018   Lump or mass in breast 11/27/2012   a.) Bx 11/27/2012 --> apocrine wall cyst   Migraine    Nausea & vomiting    Neuropathy    Non-ischemic cardiomyopathy (HCC)    a.) TTE 12/09/2016: EF 25-30%, b.) TTE 02/08/2017: EF 25%; c.) Medtronic AICD placed 03/29/2017; d.) TTE 04/25/2017: EF 30-35%; e.) TTE 01/30/2018: EF 45%; f.) TTE 11/06/2018: EF 25%; g.) TTE 05/12/2019: EF 35%; h.) TTE 11/05/2019: EF 20%; I.) TTE 06/17/2020: EF 40%; J.) TTE  06/14/2022: EF 25-30%   Obesity    OSA on CPAP    PAF (paroxysmal atrial fibrillation) (HCC)    Pneumonia    PONV (postoperative nausea and vomiting)    Post-COVID chronic cough    Postcholecystectomy diarrhea    Prolonged Q-T interval on ECG    RAD (reactive airway disease)    Restless leg    a.) on ropinirole    Sepsis (HCC)    Unstable angina (HCC)     Past Surgical History:  Procedure Laterality Date   ABDOMINAL HYSTERECTOMY     age 45   BREAST BIOPSY Right 2014   benign   BREAST BIOPSY Right 01/18/2023   Us  Core Bx, coil clip - path pending   BREAST BIOPSY Right 01/18/2023   US  RT BREAST BX W LOC DEV 1ST LESION IMG BX SPEC US  GUIDE 01/18/2023 [facility]   CARDIAC DEFIBRILLATOR PLACEMENT Left 03/29/2017   Procedure: MEDTRONIC CARDIAC DEFIBRILLATOR PLACEMENT (AICD); Location: [facility]; Surgeon: Mira Amend, MD   CESAREAN SECTION     CHOLECYSTECTOMY     COLECTOMY     COLONOSCOPY WITH PROPOFOL  N/A 02/22/2018   Procedure: COLONOSCOPY WITH PROPOFOL ;  Surgeon: Toledo, Alphonsus Jeans, MD;  Location: [facility];  Service: Gastroenterology;  Laterality: N/A;   ESOPHAGOGASTRODUODENOSCOPY (EGD) WITH PROPOFOL  N/A 02/22/2018   Procedure: ESOPHAGOGASTRODUODENOSCOPY (EGD) WITH PROPOFOL ;  Surgeon: Toledo, Alphonsus Jeans, MD;  Location: [facility];  Service: Gastroenterology;  Laterality: N/A;   IVC FILTER INSERTION N/A 08/23/2022   Procedure: IVC FILTER INSERTION;  Surgeon: Jackquelyn Mass, MD;  Location: [facility];  Service: Cardiovascular;  Laterality: N/A;   IVC FILTER REMOVAL N/A 03/07/2023   Procedure: IVC FILTER REMOVAL;  Surgeon: Jackquelyn Mass, MD;  Location: [facility];  Service: Cardiovascular;  Laterality: N/A;   KNEE ARTHROPLASTY Left 08/24/2022   Procedure: COMPUTER ASSISTED TOTAL KNEE ARTHROPLASTY;  Surgeon: Arlyne Lame, MD;  Location: [facility];  Service: Orthopedics;  Laterality: Left;   KNEE ARTHROPLASTY Right 12/01/2023   Procedure:  COMPUTER ASSISTED TOTAL KNEE ARTHROPLASTY;  Surgeon: Arlyne Lame, MD;  Location: [facility];  Service: Orthopedics;  Laterality: Right;   LEFT HEART CATH AND CORONARY ANGIOGRAPHY Left 10/21/2014   Procedure: LEFT HEART CATH AND CORONARY ANGIOGRAPHY; Location: [facility]; Surgeon: Thomasene Flemings, MD   NASAL SINUS SURGERY  2012   OOPHORECTOMY     RIGHT/LEFT HEART CATH AND CORONARY ANGIOGRAPHY Bilateral 02/24/2017   Procedure: RIGHT/LEFT HEART CATH AND CORONARY ANGIOGRAPHY; Location: [facility]   THYROIDECTOMY      MEDICATIONS:  Prior to Admission medications   Medication Sig Start Date End Date Taking? Authorizing Provider  aspirin  81 MG chewable tablet Chew 1 tablet (81 mg total) by mouth 2 (two) times daily. 12/02/23   Rojelio Clement, PA-C  chlorhexidine  (HIBICLENS ) 4 % external liquid Apply 15 mLs (1 Application total) topically as directed for 30  doses. Use as directed daily for 5 days every other week for 6 weeks. 12/02/23   Rojelio Clement, PA-C  escitalopram  (LEXAPRO ) 10 MG tablet Take 10 mg by mouth daily. 11/24/22   [provider]  gabapentin  (NEURONTIN ) 300 MG capsule Take 1 capsule (300 mg total) by mouth 3 (three) times daily. 10/08/23   Ezenduka, Nkeiruka J, MD  lacosamide  (VIMPAT ) 200 MG TABS tablet Take 1 tablet (200 mg total) by mouth 2 (two) times daily. 09/27/23 12/26/23  [place], Rema Care, DO  levothyroxine  (SYNTHROID ) 175 MCG tablet Take 1 tablet (175 mcg total) by mouth daily at 6 (six) AM. 09/28/23 12/27/23  [place], Rema Care, DO  LORazepam  (ATIVAN ) 1 MG tablet Take 1 tablet (1 mg total) by mouth 4 (four) times daily as needed. 11/10/21   Amin, Sumayya, MD  methadone  (DOLOPHINE ) 5 MG tablet Take 5 mg by mouth See admin instructions. Patient takes 1.5 tabs (7.5 mg) every morning, and 1.5 tabs (7.5 mg) midday,  and 2 tabs (10 mg) at bedtime.    [provider]  oxyCODONE -acetaminophen  (PERCOCET) 10-325 MG tablet Take 1 tablet by mouth every 4 (four) hours as needed for pain.     [provider]  potassium chloride  SA (KLOR-CON  M) 20 MEQ tablet Take 20 mEq by mouth 2 (two) times daily.    [provider]  promethazine  (PHENERGAN ) 25 MG tablet Take 25 mg by mouth every 4 (four) hours as needed for nausea or vomiting.    [provider]  rOPINIRole  (REQUIP ) 4 MG tablet Take 4 mg by mouth at bedtime. 01/30/19   [provider]  tiZANidine  (ZANAFLEX ) 4 MG capsule Take 4 mg by mouth 3 (three) times daily as needed for muscle spasms.    [provider]    Physical Exam   Triage Vital Signs: ED Triage Vitals  Encounter Vitals Group     BP 12/27/23 0452 (!) 73/56     Systolic BP Percentile --      Diastolic BP Percentile --      Pulse Rate 12/27/23 0452 69     Resp 12/27/23 0452 20     Temp 12/27/23 0452 97.9 F (36.6 C)     Temp Source 12/27/23 0452 Oral     SpO2 12/27/23 0451 99 %     Weight 12/27/23 0453 224 lb 10.4 oz (101.9 kg)     Height 12/27/23 0453 5\' 5"  (1.651 m)     Head Circumference --      Peak Flow --      Pain Score 12/27/23 0453 10     Pain Loc --      Pain Education --      Exclude from Growth Chart --     Most recent vital signs: Vitals:   12/27/23 0715 12/27/23 0730  BP: 100/63 95/67  Pulse: 61 (!) 59  Resp: 16 19  Temp:    SpO2: 94% 99%    CONSTITUTIONAL: Alert, responds appropriately to questions.  Appears uncomfortable HEAD: Normocephalic, atraumatic EYES: Conjunctivae clear, pupils appear equal, sclera nonicteric, conjunctival pallor ENT: normal nose; moist mucous membranes NECK: Supple, normal ROM CARD: RRR; S1 and S2 appreciated RESP: Normal chest excursion without splinting or tachypnea; breath sounds clear and equal bilaterally; no wheezes, no rhonchi, no rales, no hypoxia or respiratory distress, speaking full sentences ABD/GI: Non-distended; soft, non-tender, no rebound, no guarding, no peritoneal signs BACK: The back appears normal EXT: Normal ROM in all joints; no deformity  noted, no edema SKIN: Normal color for age and race; extremities feel cool to touch but not mottled, I am able to palpate +1 DP and radial pulses bilaterally, no calf tenderness or calf swelling, incision site over recent right knee replacement is clean, dry and intact without redness or warmth, compartments in her extremities are soft, no joint effusions noted. She is able to flex and extend the knee without difficulty.  NEURO: Moves all extremities equally, normal speech, no asymmetry, no drift, normal grip strength in the left hand, reports diminished sensation of the left arm and leg compared to the right, normal sensation in the face PSYCH: The patient's mood and manner are appropriate.   ED Results / Procedures / Treatments   LABS: (all labs ordered are listed, but only abnormal results are displayed) Labs Reviewed  BASIC METABOLIC PANEL WITH GFR - Abnormal; Notable for the following components:      Result Value   Creatinine, Ser 1.66 (*)    Calcium  8.7 (*)    GFR, Estimated 36 (*)    All other components within normal limits  CBC - Abnormal; Notable for the following components:   RBC 3.50 (*)    Hemoglobin 9.7 (*)    HCT 30.9 (*)    RDW 16.4 (*)    All other components within normal limits  LACTIC ACID, PLASMA - Abnormal; Notable for the following components:   Lactic Acid, Venous 2.1 (*)    All other components within normal limits  D-DIMER, QUANTITATIVE - Abnormal; Notable for the following components:   D-Dimer, Quant 2.89 (*)    All other components within normal limits  CBC WITH DIFFERENTIAL/PLATELET - Abnormal; Notable for the following components:   RBC 3.52 (*)    Hemoglobin 9.9 (*)    HCT 31.7 (*)    RDW 16.3 (*)    Abs Immature Granulocytes 0.16 (*)    All other components within normal limits  RESP PANEL BY RT-PCR (RSV, FLU A&B, COVID)  [id number]  CULTURE, BLOOD (ROUTINE X 2)  CULTURE, BLOOD (ROUTINE X 2)  PROTIME-INR  BRAIN NATRIURETIC PEPTIDE  HEPATIC  FUNCTION PANEL  PROCALCITONIN  MAGNESIUM   LACTIC ACID, PLASMA  URINALYSIS, W/ REFLEX TO CULTURE (INFECTION SUSPECTED)  TYPE AND SCREEN  TROPONIN I (HIGH SENSITIVITY)  TROPONIN I (HIGH SENSITIVITY)     EKG:    Date: 12/27/2023 5:29 AM  Rate: 70  Rhythm: normal sinus rhythm  QRS Axis: normal  Intervals: Prolonged QT interval, first-degree AV block  ST/T Wave abnormalities: normal  Conduction Disutrbances: none  Narrative Interpretation: First-degree AV block, prolonged QT interval of 507 ms     RADIOLOGY: My personal review and interpretation of imaging: Chest x-ray shows no acute abnormality.  CT head unremarkable.  I have personally reviewed all radiology reports.   CT HEAD WO CONTRAST Result Date: 12/27/2023 CLINICAL DATA:  Neuro deficit with stroke suspected.  Hypotension EXAM: CT HEAD WITHOUT CONTRAST TECHNIQUE: Contiguous axial images were obtained from the base of the skull through the vertex without intravenous contrast. RADIATION DOSE REDUCTION: This exam was performed according to the departmental dose-optimization program which includes automated exposure control, adjustment of the mA and/or kV according to patient size and/or use of iterative reconstruction technique. COMPARISON:  12/06/2023 FINDINGS: Brain: No evidence of acute infarction, hemorrhage, hydrocephalus, extra-axial collection or mass lesion/mass effect. Vascular: No hyperdense vessel or unexpected calcification. Skull: Normal. Negative for fracture or focal lesion. Sinuses/Orbits: Prior endoscopic sinus surgery. A small left sphenoid sinus fluid  level is again seen, with patent ostium. IMPRESSION: Stable, normal appearance of the brain. Electronically Signed   By: Ronnette Coke M.D.   On: 12/27/2023 06:26   DG Chest Port 1 View Result Date: 12/27/2023 CLINICAL DATA:  Chest pain EXAM: PORTABLE CHEST 1 VIEW COMPARISON:  12/06/2023 FINDINGS: Dual-chamber ICD/pacer leads in stable position. Chronic  cardiopericardial enlargement and low lung volumes. There is no edema, consolidation, effusion, or pneumothorax. IMPRESSION: Stable exam.  No evidence of acute disease. Electronically Signed   By: Ronnette Coke M.D.   On: 12/27/2023 05:30     PROCEDURES:  Critical Care performed: Yes, see critical care procedure note(s)   CRITICAL CARE Performed by: Starling Eck Lilianna Case   Total critical care time: 45 minutes  Critical care time was exclusive of separately billable procedures and treating other patients.  Critical care was necessary to treat or prevent imminent or life-threatening deterioration.  Critical care was time spent personally by me on the following activities: development of treatment plan with patient and/or surrogate as well as nursing, discussions with consultants, evaluation of patient's response to treatment, examination of patient, obtaining history from patient or surrogate, ordering and performing treatments and interventions, ordering and review of laboratory studies, ordering and review of radiographic studies, pulse oximetry and re-evaluation of patient's condition.   Aaron Aas1-3 Lead EKG Interpretation  Performed by: Ancel Easler, Clover Dao, DO Authorized by: Otha Rickles, Clover Dao, DO     Interpretation: normal     ECG rate:  59   ECG rate assessment: bradycardic     Rhythm: sinus bradycardia     Ectopy: none     Conduction: normal       IMPRESSION / MDM / ASSESSMENT AND PLAN / ED COURSE  I reviewed the triage vital signs and the nursing notes.    Patient here with complaints of headache, left arm pain, left chest pain.  Is hypotensive here in the ED.  The patient is on the cardiac monitor to evaluate for evidence of arrhythmia and/or significant heart rate changes.   DIFFERENTIAL DIAGNOSIS (includes but not limited to):   Cardiogenic shock, septic shock, hypovolemia secondary to dehydration, anemia, intracranial hemorrhage, stroke, meningitis, CVT, complex migraine, ACS, PE,  dissection, pneumonia, pneumothorax, CHF   Patient's presentation is most consistent with acute presentation with potential threat to life or bodily function.   PLAN: Will obtain labs, cultures, urine.  Will give IV fluids, broad-spectrum antibiotics.  Will obtain chest x-ray, head CT.  Patient is outside of tPA window.  NIH stroke scale of 2.   MEDICATIONS GIVEN IN ED: Medications  lactated ringers  infusion ( Intravenous Started During Downtime 12/27/23 0542)  vancomycin  (VANCOCIN ) IVPB 1000 mg/200 mL premix (1,000 mg Intravenous New Bag/Given 12/27/23 0736)  midodrine  (PROAMATINE ) tablet 10 mg (10 mg Oral Not Given 12/27/23 0545)  aztreonam  (AZACTAM ) 2 g in sodium chloride  0.9 % 100 mL IVPB (0 g Intravenous Stopped 12/27/23 0732)  metroNIDAZOLE  (FLAGYL ) IVPB 500 mg (0 mg Intravenous Stopped 12/27/23 0700)  lactated ringers  bolus 1,000 mL (0 mLs Intravenous Stopped 12/27/23 0700)    And  lactated ringers  bolus 1,000 mL (0 mLs Intravenous Stopped 12/27/23 0700)  acetaminophen  (OFIRMEV ) IV 1,000 mg (0 mg Intravenous Stopped 12/27/23 0716)  fentaNYL  (SUBLIMAZE ) injection 50 mcg (50 mcg Intravenous Given 12/27/23 0754)  prochlorperazine  (COMPAZINE ) injection 10 mg (10 mg Intravenous Given 12/27/23 0754)     ED COURSE: Patient's labs show no leukocytosis.  Chronic stable anemia.  She does have a new AKI.  Creatinine is 1.66.  Normal electrolytes.  She is getting IV fluids.  Lactic only minimally elevated at 2.1.  COVID, flu and RSV negative.  CT head, chest x-ray reviewed and interpreted by myself and the radiologist are unremarkable.  Negative procalcitonin.  Rectal temperature normal.  Seems less likely that this is septic shock.  Urine pending.  Patient currently has no source of infection.  D-dimer is elevated but this also appears chronic for patient.  Unable to rule out PE.  Patient is unable to tolerate CT contrast and had breakthrough contrast reaction with hives and shortness of breath  following a 13-hour prep.  Will obtain VQ scan.  Blood pressure improving with IV hydration.  Now in the low 100s systolic.  Patient given Tylenol  for discomfort but still complaining of pain and now states she is having abdominal pain.  Will obtain noncontrast CT of the chest, abdomen pelvis for further evaluation.  I think patient would benefit from echo this AM to evaluate for worsening cardiomyopathy or signs of right heart strain and to evaluate for cardiogenic shock or signs of PE especially given her prior history, recent admission and knee surgery.  I am concerned about starting heparin  currently given I am also concern for possible stroke and do not want to cause hemorrhagic conversion.  CT head shows no stroke.  Unable to get MRI at this time given patient has pacemaker/defibrillator.   CONSULTS:  Consulted and discussed patient's case with hospitalist, Dr. Vallarie Gauze.  I have recommended admission and consulting physician agrees and will place admission orders.  Patient (and family if present) agree with this plan.   I reviewed all nursing notes, vitals, pertinent previous records.  All labs, EKGs, imaging ordered have been independently reviewed and interpreted by myself.    OUTSIDE RECORDS REVIEWED: Reviewed patient's recent notes from her last admission, knee surgery.       FINAL CLINICAL IMPRESSION(S) / ED DIAGNOSES   Final diagnoses:  Nonspecific chest pain  Left sided numbness  Hypotension, unspecified hypotension type  AKI (acute kidney injury) (HCC)     Rx / DC Orders   ED Discharge Orders     None        Note:  This document was prepared using Dragon voice recognition software and may include unintentional dictation errors.   Edmon Magid, Clover Dao, DO 12/27/23 [phone]

## 2023-12-27 NOTE — ED Notes (Signed)
Patient to V/Q

## 2023-12-27 NOTE — ED Notes (Addendum)
 Brittany Nguyen

## 2023-12-27 NOTE — Consult Note (Signed)
 PHARMACY - ANTICOAGULATION CONSULT NOTE  Pharmacy Consult for Enoxaparin   Indication:  VTE treatment  Allergies  Allergen Reactions   Contrast Media [Iodinated Contrast Media] Shortness Of Breath    Per CT report in 2014 pt had break through contrast reaction with hives and SOB following 13 hour prep. MSY    Ferumoxytol  Nausea Only   Iron  Sucrose Anaphylaxis   Lidocaine  Hives   Metrizamide Shortness Of Breath   Penicillins Hives and Other (See Comments)    Tolerated ceftriaxone  in past IgE = 4 (WNL) on 08/12/2022  ediate rash, facial/tongue/throat swelling, SOB or lightheadedness with hypotension, tachy   Sacubitril-Valsartan     Sz (unclear if new start entresto vs hypoglycemia)   Isosorbide Nitrate Other (See Comments)    Headache   Latex Hives    IgE < 0.10 (WNL) on 08/21/2023   Ondansetron  Other (See Comments)    Severe headache   Tizanidine  Other (See Comments)    Feels altered   Povidone-Iodine  Rash    Blistering rash   Pulmicort  [Budesonide ] Itching    Patient Measurements: Height: 5\' 5"  (165.1 cm) Weight: 101.9 kg (224 lb 10.4 oz) IBW/kg (Calculated) : 57 HEPARIN  DW (KG): 80.4  Vital Signs: Temp: 98 F (36.7 C) (04/23 0948) Temp Source: Oral (04/23 0948) BP: 111/76 (04/23 0945) Pulse Rate: 68 (04/23 0945)  Labs: Recent Labs    12/27/23 0506 12/27/23 0507 12/27/23 0741  HGB 9.9* 9.7*  --   HCT 31.7* 30.9*  --   PLT 272 268  --   LABPROT 13.7  --   --   INR 1.0  --   --   CREATININE  --  1.66*  --   TROPONINIHS  --  5 3    Estimated Creatinine Clearance: 45.3 mL/min (A) (by C-G formula based on SCr of 1.66 mg/dL (H)).   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   a.) Bx 02/22/2018 --> NE tumor cells --> AE1/AE3, chromogranin, synaptophysin (+); Ki-67 proliferation rate <1%; b.) repeat Bx 08/24/2018 --> well differentiard NET (G1)   CHF (congestive heart failure) (HCC)    Chronic, continuous use of opioids    a.) has naloxone  Rx available   COPD (chronic obstructive pulmonary disease) (HCC)    Coronary artery disease    Depression    Diet-controlled type 2 diabetes mellitus (HCC)    Diverticulitis 2010   DVT (deep venous thrombosis) (HCC)    Endometriosis 1990   Generalized epilepsy (HCC)    a.) on lacosamide ; b.) seizure 07-30-22 in setting of hypokalemia (K+ 2.6); c.) last seizure 11/13/2023 during blood draw   GERD (gastroesophageal reflux disease)    GIB (gastrointestinal bleeding) 01/08/2018   H/O syncope    HFrEF (heart failure with reduced ejection fraction) (HCC)    a.)TTE 12/09/16: EF 25-30%, dif HK; b.) TTE 02/08/17: EF 25%, dif HK, mild TR, G1DD; c.) TTE 04/25/17: EF 30-35%, mild LVH; d.) TTE 01/30/18: EF 45%, MAC, LAE, mild MR, G1DD; e.) TTE 11/06/2018: EF 25%, sev glob HK, triv MR/TR/PR; f.) TTE  05/12/19: EF 35%, MAC, AoV sclerosis, G1DD; g.) TTE 11/05/19: EF 20%, sev glob HK, triv MR; h.) TTE 06/17/20: EF 40%, mod glob HK, triv MR; I:) TTE 06/14/22: EF 25-30%, G1DD   History of cardiac catheterization    a.) LHC 10/21/2014: normal coronaries; b.) R/LHC 02/24/2017: normal coronaries, mRA 12, mPA 28, mPCWP 15, CO 8.0, CI 3.8   History of kidney stones    Hx MRSA infection    Hypertension    Hypokalemia    Hypothyroidism    a.) s/p thyroidectomy   Incomplete left bundle branch block (LBBB)    Iron  deficiency anemia 03/27/2018   Lump or mass in breast 11/27/2012   a.) Bx 11/27/2012 --> apocrine wall cyst   Migraine    Nausea & vomiting    Neuropathy    Non-ischemic cardiomyopathy (HCC)    a.) TTE 12/09/2016: EF  25-30%, b.) TTE 02/08/2017: EF 25%; c.) Medtronic AICD placed 03/29/2017; d.) TTE 04/25/2017: EF 30-35%; e.) TTE 01/30/2018: EF 45%; f.) TTE 11/06/2018: EF 25%; g.) TTE 05/12/2019: EF 35%; h.) TTE 11/05/2019: EF 20%; I.) TTE 06/17/2020: EF 40%; J.) TTE 06/14/2022: EF 25-30%   Obesity    OSA on CPAP    PAF (paroxysmal atrial fibrillation) (HCC)    Pneumonia    PONV (postoperative nausea and vomiting)    Post-COVID chronic cough    Postcholecystectomy diarrhea    Prolonged Q-T interval on ECG    RAD (reactive airway disease)    Restless leg    a.) on ropinirole    Sepsis (HCC)    Unstable angina (HCC)     Medications:  No home anticoagulation per pharmacist review  Assessment: 58yo female presented to the ED with left sided weakness and numbness as well as chest pain and headache.  PMH includes CHF, COPD, DVT s/p IVC filter, epilepsy, HTN, CAD and hypothyroidism.  Pharmacy consulted to initiate therapeutic enoxaparin .  Goal of Therapy:  Anti-Xa level 0.6-1 units/ml 4hrs after LMWH dose given Monitor platelets by anticoagulation protocol: Yes   Plan:  Start enoxaparin  1 mg/kg (100 mg) SubQ every 12 hours Consider Anti-Xa level if long term use is planned CBC at least every 72 hours  Ramonita Burow, PharmD, BCPS 12/27/2023,10:07 AM

## 2023-12-27 NOTE — ED Notes (Signed)
 This RN gave report to [place] and performed care handoff. Call light in reach, bed wheels locked, side rail raised, pt updated on plan of care. Rounding completed.

## 2023-12-27 NOTE — ED Notes (Signed)
 Patient awake and alert, color has significantly improved since receiving report. Patient states she feels much better.

## 2023-12-27 NOTE — Assessment & Plan Note (Signed)
 CPAP.

## 2023-12-27 NOTE — Progress Notes (Signed)
 Occupational Therapy Evaluation Patient Details Name: Brittany Nguyen MRN: [id number] DOB: 1965/12/15 Today's Date: 12/27/2023   History of Present Illness   Brittany Nguyen is a 58 y.o. female with medical history significant of hypothyroid, anxiety, hypertension, restless leg syndrome, history of DVT status post IVC filter placement, CAD status post AICD, seizure, hypertension, COPD, anxiety presenting with hypotension, chest pain, left arm weakness.  Patient reports waking up with left-sided arm weakness.     Clinical Impressions Pt was seen for OT evaluation this date. Prior to hospital admission, pt was recovering from R TKA 12/01/23, pt reports PTA she was indep in all ADL/IADLs no longer requiring DME. Pt lives with her spouse with 8 steps to enter. During session pt completed UB/LB dressing tasks while seated and standing at EOB. Pt amb to BR within the hallway no DME, completed toileting indep with no physical assistance required. Throughout session pt demonstrated normal balance during reaching outside her BOS during dressing/toileting tasks. Pt feels confident in her abilities to return home and complete her ADL/IADLs with no difficulties. Do not anticipate the need for follow up OT services upon acute hospital DC. OT will sign off.      If plan is discharge home, recommend the following:   Assistance with cooking/housework     Functional Status Assessment   Patient has not had a recent decline in their functional status     Equipment Recommendations   None recommended by OT     Recommendations for Other Services         Precautions/Restrictions   Precautions Precautions: Fall Recall of Precautions/Restrictions: Intact Restrictions Weight Bearing Restrictions Per Provider Order: Yes RLE Weight Bearing Per Provider Order: Weight bearing as tolerated     Mobility Bed Mobility Overal bed mobility: Independent                   Transfers Overall transfer level: Independent Equipment used: None               General transfer comment: Amb to BR within hallway with no DME or LOB noted ~181ft      Balance Overall balance assessment: No apparent balance deficits (not formally assessed)                                         ADL either performed or assessed with clinical judgement   ADL Overall ADL's : At baseline;Independent                                       General ADL Comments: Doffing personal clothing and donned gown; indep, toileting/pericare; indep     Pertinent Vitals/Pain Pain Assessment Pain Assessment: No/denies pain     Extremity/Trunk Assessment Upper Extremity Assessment Upper Extremity Assessment: Overall WFL for tasks assessed   Lower Extremity Assessment Lower Extremity Assessment: Overall WFL for tasks assessed;RLE deficits/detail RLE Deficits / Details: R TKA 12/01/23       Communication Communication Communication: No apparent difficulties   Cognition Arousal: Alert Behavior During Therapy: WFL for tasks assessed/performed Cognition: No apparent impairments             OT - Cognition Comments: A/Ox4                 Following commands: Intact  Cueing  General Comments   Cueing Techniques: Verbal cues  RLE TKR operative side D/C/I   Exercises Exercises: Other exercises Other Exercises Other Exercises: Edu: Role of OT, purpose of session   Shoulder Instructions      Home Living Family/patient expects to be discharged to:: Private residence Living Arrangements: Spouse/significant other Available Help at Discharge: Family;Available 24 hours/day Type of Home: Mobile home Home Access: Stairs to enter Entrance Stairs-Number of Steps: 8 Entrance Stairs-Rails: Can reach both;Right;Left Home Layout: One level     Bathroom Shower/Tub: Chief Strategy Officer: Standard Bathroom  Accessibility: Yes   Home Equipment: Grab bars - tub/shower;Shower seat - built in;Cane - single Librarian, academic (2 wheels);BSC/3in1          Prior Functioning/Environment Prior Level of Function : Independent/Modified Independent;Driving             Mobility Comments: Ind amb community distances without an AD, no fall history ADLs Comments: Ind with ADLs    OT Problem List:     OT Treatment/Interventions:        OT Goals(Current goals can be found in the care plan section)   Acute Rehab OT Goals Patient Stated Goal: return home OT Goal Formulation: With patient Time For Goal Achievement: 01/10/24 Potential to Achieve Goals: Good   OT Frequency:       Co-evaluation              AM-PAC OT "6 Clicks" Daily Activity     Outcome Measure Help from another person eating meals?: None Help from another person taking care of personal grooming?: None Help from another person toileting, which includes using toliet, bedpan, or urinal?: None Help from another person bathing (including washing, rinsing, drying)?: None Help from another person to put on and taking off regular upper body clothing?: None Help from another person to put on and taking off regular lower body clothing?: None 6 Click Score: 24   End of Session Equipment Utilized During Treatment: Gait belt Nurse Communication: Mobility status  Activity Tolerance: Patient tolerated treatment well Patient left: in bed;with call bell/phone within reach;with bed alarm set;with nursing/sitter in room                   Time: 1120-1137 OT Time Calculation (min): 17 min Charges:  OT General Charges $OT Visit: 1 Visit OT Evaluation $OT Eval Low Complexity: 1 Low OT Treatments $Self Care/Home Management : 8-22 mins  Rosaria Common M.S. OTR/L  12/27/23, 12:18 PM

## 2023-12-27 NOTE — Consult Note (Signed)
 [facility] CARDIOLOGY CONSULT NOTE       Patient ID: Brittany Nguyen MRN: [id number] DOB/AGE: [birth year] 56 y.o.  Admit date: 12/27/2023 Referring Physician Dr. Daisey Nguyen Primary Physician Brittany San, MD  Primary Cardiologist Brittany Nguyen [facility]) Reason for Consultation Recurrent hypotension  HPI: Brittany Nguyen is a 58 y.o. female  with a past medical history of hypertension, coronary artery disease, chronic systolic and diastolic heart failure, nonischemic cardiomyopathy s/p AICD (2018), paroxysmal atrial fibrillation, COPD, hypothyroidism, diabetes, history of DVT s/p IVC filter placement, seizures, hx of anemia who presented to the ED on 12/27/2023 for multiple concerns including headache, left arm and leg pain with associated numbness and weakness. Upon admission to the ED patient was found to be hypotension. Cardiology was consulted for further evaluation.   Patient presented to the ED with multiple concerns including headache, left arm, and neck pain. Patient is a poor historian and per hospitalist and ED note patient also reported left arm pain and left sided chest pain, however at the time of my evaluation this afternoon patient denies any left leg pain or chest pain.  Workup in the ED notable for sodium 139, potassium 3.8, creatinine 1.66 (Cr baseline around 1.0), hemoglobin 9.7 (chronically with lower hemoglobin with hx of anemia), WBC 9.4, platelets 268.  Lactic acid elevated and trending 2.1 > 2.8. D-dimer elevated at 2.89.  US  LE showed no evidence of DVT.  V/Q scan pending. Viral respiratory panel negative.  Blood cultures ordered and pending.  Head CT stable.  Chest x-ray with no acute cardiopulmonary disease. CT chest, abdomen, pelvis with no acute changes. EKG in the ED with sinus rhythm, first-degree AV block, prolonged QTc 507 ms, rate 70 bpm.  Troponin is negative x 2.  BNP normal at 18.  Patient received fluids and started on IV antibiotics. Of note patient was  recently in ED on 4/2-4/4 for hypotension.   At the time of my evaluation this afternoon, patient was resting comfortably in ED bed at a slight incline in no acute distress. Discussed patients symptoms in more detail. Patient denies any chest pain, leg pain, SOB, dizziness, lightlessness, or syncope. Patients BP has improved s/p IVFs. Her only complaints are L sided arm, neck pain and headache. These symptoms have improved since she came in. BP also improved.   Review of systems complete and found to be negative unless listed above    Past Medical History:  Diagnosis Date   Acute anterolateral wall MI (HCC) 08/2016   Acute respiratory failure (HCC)    AICD (automatic cardioverter/defibrillator) present 03/29/2017   a.) Medtronic Evera MRI XT DR SureScan (SN: [id number])   Anxiety    a.) on BZO PRN (lorazepam )   Arthritis    Asthma 2013   Breast cancer, right (HCC)    C. 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   a.) Bx 02/22/2018 --> NE tumor cells --> AE1/AE3, chromogranin, synaptophysin (+); Ki-67 proliferation rate <1%; b.) repeat Bx 08/24/2018 --> well differentiard NET (G1)   CHF (congestive heart failure) (HCC)    Chronic, continuous use of opioids    a.) has naloxone  Rx available   COPD (chronic obstructive pulmonary disease) (HCC)    Coronary artery disease    Depression    Diet-controlled type 2 diabetes mellitus (HCC)    Diverticulitis 2010   DVT (deep venous thrombosis) (HCC)    Endometriosis 1990   Generalized epilepsy (HCC)    a.) on lacosamide ;  b.) seizure 07-30-22 in setting of hypokalemia (K+ 2.6); c.) last seizure 11/13/2023 during blood draw   GERD (gastroesophageal reflux disease)    GIB (gastrointestinal bleeding) 01/08/2018   H/O syncope    HFrEF (heart failure with reduced ejection fraction) (HCC)    a.)TTE 12/09/16: EF 25-30%, dif HK; b.) TTE 02/08/17: EF 25%, dif HK, mild TR, G1DD; c.) TTE 04/25/17: EF 30-35%,  mild LVH; d.) TTE 01/30/18: EF 45%, MAC, LAE, mild MR, G1DD; e.) TTE 11/06/2018: EF 25%, sev glob HK, triv MR/TR/PR; f.) TTE 05/12/19: EF 35%, MAC, AoV sclerosis, G1DD; g.) TTE 11/05/19: EF 20%, sev glob HK, triv MR; h.) TTE 06/17/20: EF 40%, mod glob HK, triv MR; I:) TTE 06/14/22: EF 25-30%, G1DD   History of cardiac catheterization    a.) LHC 10/21/2014: normal coronaries; b.) R/LHC 02/24/2017: normal coronaries, mRA 12, mPA 28, mPCWP 15, CO 8.0, CI 3.8   History of kidney stones    Hx MRSA infection    Hypertension    Hypokalemia    Hypothyroidism    a.) s/p thyroidectomy   Incomplete left bundle branch block (LBBB)    Iron  deficiency anemia 03/27/2018   Lump or mass in breast 11/27/2012   a.) Bx 11/27/2012 --> apocrine wall cyst   Migraine    Nausea & vomiting    Neuropathy    Non-ischemic cardiomyopathy (HCC)    a.) TTE 12/09/2016: EF 25-30%, b.) TTE 02/08/2017: EF 25%; c.) Medtronic AICD placed 03/29/2017; d.) TTE 04/25/2017: EF 30-35%; e.) TTE 01/30/2018: EF 45%; f.) TTE 11/06/2018: EF 25%; g.) TTE 05/12/2019: EF 35%; h.) TTE 11/05/2019: EF 20%; I.) TTE 06/17/2020: EF 40%; J.) TTE 06/14/2022: EF 25-30%   Obesity    OSA on CPAP    PAF (paroxysmal atrial fibrillation) (HCC)    Pneumonia    PONV (postoperative nausea and vomiting)    Post-COVID chronic cough    Postcholecystectomy diarrhea    Prolonged Q-T interval on ECG    RAD (reactive airway disease)    Restless leg    a.) on ropinirole    Sepsis (HCC)    Unstable angina (HCC)     Past Surgical History:  Procedure Laterality Date   ABDOMINAL HYSTERECTOMY     age 52   BREAST BIOPSY Right 2014   benign   BREAST BIOPSY Right 01/18/2023   Us  Core Bx, coil clip - path pending   BREAST BIOPSY Right 01/18/2023   US  RT BREAST BX W LOC DEV 1ST LESION IMG BX SPEC US  GUIDE 01/18/2023 [facility]   CARDIAC DEFIBRILLATOR PLACEMENT Left 03/29/2017   Procedure: MEDTRONIC CARDIAC DEFIBRILLATOR PLACEMENT (AICD); Location: [facility]; Surgeon:  Brittany Amend, MD   CESAREAN SECTION     CHOLECYSTECTOMY     COLECTOMY     COLONOSCOPY WITH PROPOFOL  N/A 02/22/2018   Procedure: COLONOSCOPY WITH PROPOFOL ;  Surgeon: Toledo, Alphonsus Jeans, MD;  Location: [facility];  Service: Gastroenterology;  Laterality: N/A;   ESOPHAGOGASTRODUODENOSCOPY (EGD) WITH PROPOFOL  N/A 02/22/2018   Procedure: ESOPHAGOGASTRODUODENOSCOPY (EGD) WITH PROPOFOL ;  Surgeon: Toledo, Alphonsus Jeans, MD;  Location: [facility];  Service: Gastroenterology;  Laterality: N/A;   IVC FILTER INSERTION N/A 08/23/2022   Procedure: IVC FILTER INSERTION;  Surgeon: Jackquelyn Mass, MD;  Location: [facility];  Service: Cardiovascular;  Laterality: N/A;   IVC FILTER REMOVAL N/A 03/07/2023   Procedure: IVC FILTER REMOVAL;  Surgeon: Jackquelyn Mass, MD;  Location: [facility];  Service: Cardiovascular;  Laterality: N/A;   KNEE ARTHROPLASTY Left  08/24/2022   Procedure: COMPUTER ASSISTED TOTAL KNEE ARTHROPLASTY;  Surgeon: Arlyne Lame, MD;  Location: [facility] OR[facility];  Service: Orthopedics;  Laterality: Left;   KNEE ARTHROPLASTY Right 12/01/2023   Procedure: COMPUTER ASSISTED TOTAL KNEE ARTHROPLASTY;  Surgeon: Arlyne Lame, MD;  Location: [facility];  Service: Orthopedics;  Laterality: Right;   LEFT HEART CATH AND CORONARY ANGIOGRAPHY Left 10/21/2014   Procedure: LEFT HEART CATH AND CORONARY ANGIOGRAPHY; Location: [facility]; Surgeon: Thomasene Flemings, MD   NASAL SINUS SURGERY  2012   OOPHORECTOMY     RIGHT/LEFT HEART CATH AND CORONARY ANGIOGRAPHY Bilateral 02/24/2017   Procedure: RIGHT/LEFT HEART CATH AND CORONARY ANGIOGRAPHY; Location: [facility]   THYROIDECTOMY      (Not in a hospital admission)  Social History   Socioeconomic History   Marital status: Married    Spouse name: Mamta, Rimmer (Spouse) [phone] (Home Phone)   Number of children: Not on file   Years of education: Not on file   Highest education level: Not on file  Occupational History   Occupation: works at  [employer].  Tobacco Use   Smoking status: Never   Smokeless tobacco: Never  Vaping Use   Vaping status: Never Used  Substance and Sexual Activity   Alcohol use: No   Drug use: Never   Sexual activity: Yes    Birth control/protection: Post-menopausal  Other Topics Concern   Not on file  Social History Narrative   Not on file   Social Drivers of Health   Financial Resource Strain: High Risk (11/24/2023)   [facility]   Overall Financial Resource Strain (CARDIA)    Difficulty of Paying Living Expenses: Hard  Food Insecurity: No Food Insecurity (12/06/2023)   Hunger Vital Sign    Worried About Running Out of Food in the Last Year: Never true    Ran Out of Food in the Last Year: Never true  Recent Concern: Food Insecurity - Food Insecurity Present (11/24/2023)   Received from [facility]   Hunger Vital Sign    Worried About Running Out of Food in the Last Year: Sometimes true    Ran Out of Food in the Last Year: Sometimes true  Transportation Needs: No Transportation Needs (12/06/2023)   PRAPARE - Administrator, Civil Service (Medical): No    Lack of Transportation (Non-Medical): No  Physical Activity: Insufficiently Active (12/07/2020)   Received from [facility], [facility]   Exercise Vital Sign    Days of Exercise per Week: 2 days    Minutes of Exercise per Session: 20 min  Stress: Stress Concern Present (12/07/2020)   Received from [facility], [facility]   [employer] of Occupational Health - Occupational Stress Questionnaire    Feeling of Stress : Rather much  Social Connections: Socially Integrated (12/01/2023)   Social Connection and Isolation Panel [NHANES]    Frequency of Communication with Friends and Family: More than three times a week    Frequency of Social Gatherings with Friends and Family: More than three times a week     Attends Religious Services: More than 4 times per year    Active Member of [employer] or Organizations: Yes    Attends Banker Meetings: 1 to 4 times per year    Marital Status: Married  Catering manager Violence: Not At Risk (12/06/2023)   Humiliation, Afraid, Rape, and Kick questionnaire    Fear of Current or  Ex-Partner: No    Emotionally Abused: No    Physically Abused: No    Sexually Abused: No    Family History  Problem Relation Age of Onset   Breast cancer Mother    Cancer Mother 82       ovarian   CAD Mother    Cancer Father 53       brain   CAD Father    Cancer Daughter 68       skin   Breast cancer Maternal Aunt    Cancer Maternal Aunt 105       breast   Leukemia Paternal Grandfather    Breast cancer Cousin      Vitals:   12/27/23 0915 12/27/23 0930 12/27/23 0945 12/27/23 0948  BP: 93/67 115/79 111/76   Pulse: (!) 59 61 68   Resp: 12 11 14    Temp:    98 F (36.7 C)  TempSrc:    Oral  SpO2: 99% 98% 97%   Weight:      Height:        PHYSICAL EXAM General: Well appearing female, well nourished, in no acute distress. HEENT: Normocephalic and atraumatic. Neck: No JVD.  Lungs: Normal respiratory effort on room air. Clear bilaterally to auscultation. No wheezes, crackles, rhonchi.  Heart: HRRR. Normal S1 and S2 without gallops or murmurs.  Abdomen: Non-distended appearing.  Msk: Normal strength and tone for age. Extremities: Warm and well perfused. No clubbing, cyanosis. No edema.  Neuro: Alert and oriented X 3. Psych: Answers questions appropriately.   Labs: Basic Metabolic Panel: Recent Labs    12/27/23 0507  NA 139  K 3.8  CL 100  CO2 30  GLUCOSE 88  BUN 12  CREATININE 1.66*  CALCIUM  8.7*  MG 2.0   Liver Function Tests: Recent Labs    12/27/23 0506  AST 28  ALT 19  ALKPHOS 76  BILITOT 0.5  PROT 7.3  ALBUMIN  3.6   No results for input(s): "LIPASE", "AMYLASE" in the last 72 hours. CBC: Recent Labs    12/27/23 0506  12/27/23 0507  WBC 9.7 9.4  NEUTROABS 6.2  --   HGB 9.9* 9.7*  HCT 31.7* 30.9*  MCV 90.1 88.3  PLT 272 268   Cardiac Enzymes: Recent Labs    12/27/23 0507 12/27/23 0741  TROPONINIHS 5 3   BNP: Recent Labs    12/27/23 0507  BNP 18.2   D-Dimer: Recent Labs    12/27/23 0506  DDIMER 2.89*   Hemoglobin A1C: No results for input(s): "HGBA1C" in the last 72 hours. Fasting Lipid Panel: No results for input(s): "CHOL", "HDL", "LDLCALC", "TRIG", "CHOLHDL", "LDLDIRECT" in the last 72 hours. Thyroid  Function Tests: No results for input(s): "TSH", "T4TOTAL", "T3FREE", "THYROIDAB" in the last 72 hours.  Invalid input(s): "FREET3" Anemia Panel: No results for input(s): "VITAMINB12", "FOLATE", "FERRITIN", "TIBC", "IRON ", "RETICCTPCT" in the last 72 hours.   Radiology: CT CHEST ABDOMEN PELVIS WO CONTRAST Result Date: 12/27/2023 CLINICAL DATA:  Septicemia. Chest pain radiating to left arm and neck. EXAM: CT CHEST, ABDOMEN AND PELVIS WITHOUT CONTRAST TECHNIQUE: Multidetector CT imaging of the chest, abdomen and pelvis was performed following the standard protocol without IV contrast. RADIATION DOSE REDUCTION: This exam was performed according to the departmental dose-optimization program which includes automated exposure control, adjustment of the mA and/or kV according to patient size and/or use of iterative reconstruction technique. COMPARISON:  12/06/2023 FINDINGS: CT CHEST FINDINGS Cardiovascular: Left chest wall ICD with leads in the right ventricle and  cavoatrial junction. Heart size is mildly enlarged. No pericardial effusion. Mediastinum/Nodes: No enlarged mediastinal, hilar, or axillary lymph nodes. Thyroid  gland, trachea, and esophagus demonstrate no significant findings. Lungs/Pleura: Central airways are patent. No pleural effusion, airspace consolidation, atelectasis or pneumothorax. Dependent changes are noted along the posterior lower lobes no atelectasis or pneumothorax. Stable  tiny nodule within the posterior right lower lobe measuring 3 mm, image 104/4. Musculoskeletal: Subacute fracture deformities involving the right fourth, fifth, sixth, and seventh ribs are again noted and appear unchanged. No acute findings. CT ABDOMEN PELVIS FINDINGS Hepatobiliary: Mild steatosis within the liver. No focal liver abnormality. Status post cholecystectomy. No bile duct dilatation. Pancreas: Unremarkable. No pancreatic ductal dilatation or surrounding inflammatory changes. Spleen: Normal in size without focal abnormality. Adrenals/Urinary Tract: Normal adrenal glands. No nephrolithiasis, hydronephrosis or mass. Urinary bladder appears within normal limits. Stomach/Bowel: Stomach is normal. No pathologic dilatation of the large or small bowel loops. Sigmoid anastomosis identified within the pelvis. The appendix is not visualized and may be surgically absent. No secondary signs of acute appendicitis. Vascular/Lymphatic: Normal appearance of the abdominal aorta. IVC filter in place. No signs of abdominopelvic adenopathy. Reproductive: Status post hysterectomy. No adnexal masses. Other: No free fluid or fluid collections. No signs of pneumoperitoneum. Musculoskeletal: No acute or significant osseous findings. IMPRESSION: 1. No acute findings within the chest, abdomen or pelvis. 2. Subacute fracture deformities involving the right fourth, fifth, sixth, and seventh ribs are again noted and appear unchanged. 3. Mild hepatic steatosis. 4. Stable tiny nodule within the posterior right lower lobe measuring 3 mm. If the patient is at high risk for bronchogenic carcinoma, follow-up chest CT at 1 year is recommended. If the patient is at low risk, no follow-up is needed. This recommendation follows the consensus statement: Guidelines for Management of Small Pulmonary Nodules Detected on CT Scans: A Statement from the Fleischner Society as published in Radiology 2005; 237:395-400. Electronically Signed   By: Kimberley Penman M.D.   On: 12/27/2023 08:31   CT HEAD WO CONTRAST Result Date: 12/27/2023 CLINICAL DATA:  Neuro deficit with stroke suspected.  Hypotension EXAM: CT HEAD WITHOUT CONTRAST TECHNIQUE: Contiguous axial images were obtained from the base of the skull through the vertex without intravenous contrast. RADIATION DOSE REDUCTION: This exam was performed according to the departmental dose-optimization program which includes automated exposure control, adjustment of the mA and/or kV according to patient size and/or use of iterative reconstruction technique. COMPARISON:  12/06/2023 FINDINGS: Brain: No evidence of acute infarction, hemorrhage, hydrocephalus, extra-axial collection or mass lesion/mass effect. Vascular: No hyperdense vessel or unexpected calcification. Skull: Normal. Negative for fracture or focal lesion. Sinuses/Orbits: Prior endoscopic sinus surgery. A small left sphenoid sinus fluid level is again seen, with patent ostium. IMPRESSION: Stable, normal appearance of the brain. Electronically Signed   By: Ronnette Coke M.D.   On: 12/27/2023 06:26   DG Chest Port 1 View Result Date: 12/27/2023 CLINICAL DATA:  Chest pain EXAM: PORTABLE CHEST 1 VIEW COMPARISON:  12/06/2023 FINDINGS: Dual-chamber ICD/pacer leads in stable position. Chronic cardiopericardial enlargement and low lung volumes. There is no edema, consolidation, effusion, or pneumothorax. IMPRESSION: Stable exam.  No evidence of acute disease. Electronically Signed   By: Ronnette Coke M.D.   On: 12/27/2023 05:30   CT CHEST WO CONTRAST Result Date: 12/06/2023 CLINICAL DATA:  Hypotension of unclear source. Recent right knee surgery. EXAM: CT CHEST, ABDOMEN AND PELVIS WITHOUT CONTRAST TECHNIQUE: Multidetector CT imaging of the chest, abdomen and pelvis was performed following the  standard protocol without IV contrast. RADIATION DOSE REDUCTION: This exam was performed according to the departmental dose-optimization program which includes  automated exposure control, adjustment of the mA and/or kV according to patient size and/or use of iterative reconstruction technique. COMPARISON:  Chest CT 03/08/2023.  CT abdomen and pelvis 12/15/2021. FINDINGS: CT CHEST FINDINGS Cardiovascular: The heart is mildly enlarged. There is no pericardial effusion. Left-sided pacemaker is present. Aorta is normal in size. Mediastinum/Nodes: No enlarged mediastinal or axillary lymph nodes. Trachea and esophagus are within normal limits. There surgical clips in the region of the thyroid . Lungs/Pleura: There is a 3 mm nodule in the right lung base image 4/105 which is unchanged from 2023. The lungs are otherwise clear. No pleural effusion or pneumothorax. Musculoskeletal: There subacute appearing right anterolateral fourth through seventh rib fractures. CT ABDOMEN PELVIS FINDINGS Hepatobiliary: No focal liver abnormality is seen. Status post cholecystectomy. No biliary dilatation. Pancreas: Unremarkable. No pancreatic ductal dilatation or surrounding inflammatory changes. Spleen: Normal in size without focal abnormality. Adrenals/Urinary Tract: Adrenal glands are unremarkable. Kidneys are normal, without renal calculi, focal lesion, or hydronephrosis. Bladder is unremarkable. Stomach/Bowel: Sigmoid colon anastomosis present. Scattered colonic diverticula present. Vascular/Lymphatic: IVC filter in place inferior to the level of the renal veins. Aorta and IVC are normal in size. No enlarged lymph nodes are identified. Reproductive: Status post hysterectomy. No adnexal masses. Other: No abdominal wall hernia or abnormality. No abdominopelvic ascites. Musculoskeletal: No acute or significant osseous findings. IMPRESSION: 1. Subacute appearing right anterolateral fourth through seventh rib fractures. 2. No acute localizing process in the abdomen or pelvis. 3. Colonic diverticulosis. 4. IVC filter in place. 5. Stable 3 mm nodule in the right lung base. No follow-up needed if  patient is low-risk.This recommendation follows the consensus statement: Guidelines for Management of Incidental Pulmonary Nodules Detected on CT Images: From the Fleischner Society 2017; Radiology 2017; 284:228-243. Electronically Signed   By: Tyron Gallon M.D.   On: 12/06/2023 16:03   CT ABDOMEN PELVIS WO CONTRAST Result Date: 12/06/2023 CLINICAL DATA:  Hypotension of unclear source. Recent right knee surgery. EXAM: CT CHEST, ABDOMEN AND PELVIS WITHOUT CONTRAST TECHNIQUE: Multidetector CT imaging of the chest, abdomen and pelvis was performed following the standard protocol without IV contrast. RADIATION DOSE REDUCTION: This exam was performed according to the departmental dose-optimization program which includes automated exposure control, adjustment of the mA and/or kV according to patient size and/or use of iterative reconstruction technique. COMPARISON:  Chest CT 03/08/2023.  CT abdomen and pelvis 12/15/2021. FINDINGS: CT CHEST FINDINGS Cardiovascular: The heart is mildly enlarged. There is no pericardial effusion. Left-sided pacemaker is present. Aorta is normal in size. Mediastinum/Nodes: No enlarged mediastinal or axillary lymph nodes. Trachea and esophagus are within normal limits. There surgical clips in the region of the thyroid . Lungs/Pleura: There is a 3 mm nodule in the right lung base image 4/105 which is unchanged from 2023. The lungs are otherwise clear. No pleural effusion or pneumothorax. Musculoskeletal: There subacute appearing right anterolateral fourth through seventh rib fractures. CT ABDOMEN PELVIS FINDINGS Hepatobiliary: No focal liver abnormality is seen. Status post cholecystectomy. No biliary dilatation. Pancreas: Unremarkable. No pancreatic ductal dilatation or surrounding inflammatory changes. Spleen: Normal in size without focal abnormality. Adrenals/Urinary Tract: Adrenal glands are unremarkable. Kidneys are normal, without renal calculi, focal lesion, or hydronephrosis. Bladder  is unremarkable. Stomach/Bowel: Sigmoid colon anastomosis present. Scattered colonic diverticula present. Vascular/Lymphatic: IVC filter in place inferior to the level of the renal veins. Aorta and IVC are normal in size.  No enlarged lymph nodes are identified. Reproductive: Status post hysterectomy. No adnexal masses. Other: No abdominal wall hernia or abnormality. No abdominopelvic ascites. Musculoskeletal: No acute or significant osseous findings. IMPRESSION: 1. Subacute appearing right anterolateral fourth through seventh rib fractures. 2. No acute localizing process in the abdomen or pelvis. 3. Colonic diverticulosis. 4. IVC filter in place. 5. Stable 3 mm nodule in the right lung base. No follow-up needed if patient is low-risk.This recommendation follows the consensus statement: Guidelines for Management of Incidental Pulmonary Nodules Detected on CT Images: From the Fleischner Society 2017; Radiology 2017; 284:228-243. Electronically Signed   By: Tyron Gallon M.D.   On: 12/06/2023 16:03   CT HEAD WO CONTRAST ( ) Result Date: 12/06/2023 CLINICAL DATA:  Altered mental status. EXAM: CT HEAD WITHOUT CONTRAST TECHNIQUE: Contiguous axial images were obtained from the base of the skull through the vertex without intravenous contrast. RADIATION DOSE REDUCTION: This exam was performed according to the departmental dose-optimization program which includes automated exposure control, adjustment of the mA and/or kV according to patient size and/or use of iterative reconstruction technique. COMPARISON:  Head CT 10/06/2023.  MRI brain 09/25/2023. FINDINGS: Brain: No evidence of acute infarction, hemorrhage, hydrocephalus, extra-axial collection or mass lesion/mass effect. Vascular: No hyperdense vessel or unexpected calcification. Skull: Normal. Negative for fracture or focal lesion. Sinuses/Orbits: There is a small air-fluid level in the left sphenoid sinus. Patient is status post sinonasal surgery bilaterally.  Orbits are within normal limits. Other: None. IMPRESSION: 1. No acute intracranial process. 2. Small air-fluid level in the left sphenoid sinus. Correlate for acute sinusitis. Electronically Signed   By: Tyron Gallon M.D.   On: 12/06/2023 15:49   DG Chest Port 1 View Result Date: 12/06/2023 CLINICAL DATA:  Concern for sepsis. EXAM: PORTABLE CHEST 1 VIEW COMPARISON:  Chest radiograph dated 12/20/2023. FINDINGS: Low lung volumes with associated accentuation of the cardiac silhouette. Stable left subclavian dual lead cardiac device in place. No focal consolidation, sizeable pleural effusion, or pneumothorax. No acute osseous abnormality. IMPRESSION: Low lung volumes.  No acute cardiopulmonary findings. Electronically Signed   By: Mannie Seek M.D.   On: 12/06/2023 14:13   DG Knee Right Port Result Date: 12/01/2023 CLINICAL DATA:  Status post right knee arthroplasty. EXAM: PORTABLE RIGHT KNEE - 1-2 VIEW COMPARISON:  Right knee radiographs 05/13/2018 FINDINGS: Interval total right knee arthroplasty. No perihardware lucency is seen to indicate hardware failure or loosening. Expected postoperative changes including intra-articular and subcutaneous air. Smalljoint effusion. Anterior surgical skin staples. There is a superior approach surgical drain overlying the anterior femorotibial joint space. Old screw tracts are seen within the distal femoral diaphysis and proximal tibial diaphysis. No acute fracture or dislocation. IMPRESSION: Interval total right knee arthroplasty without evidence of hardware failure. Electronically Signed   By: Bertina Broccoli M.D.   On: 12/01/2023 13:09    ECHO ordered  TELEMETRY reviewed by me 12/27/2023: sinus rhythm, rate 70s  EKG reviewed by me: sinus rhythm, 1st degree AVB, prolonged QTc 507 ms, rate 70 bpm  Data reviewed by me 12/27/2023: last 24h vitals tele labs imaging I/O ED provider note, admission H&P  Principal Problem:   Hypotensive episode Active Problems:    GERD   Chest pain   Hypotension   COPD (chronic obstructive pulmonary disease) (HCC)   Type 2 diabetes mellitus without complication (HCC)   History of DVT (deep vein thrombosis)   Mild intermittent asthma without complication   OSA on CPAP   Hypothyroidism   CAD (coronary  artery disease)   Encephalopathy   Recurrent seizures (HCC)   HFrEF (heart failure with reduced ejection fraction) (HCC)    ASSESSMENT AND PLAN:  Ayen Viviano is a 59 y.o. female  with a past medical history of hypertension, coronary artery disease, chronic systolic and diastolic heart failure, nonischemic cardiomyopathy s/p AICD (2018), paroxysmal atrial fibrillation, COPD, hypothyroidism, diabetes, history of DVT s/p IVC filter placement, seizures, hx of anemia who presented to the ED on 12/27/2023 for multiple concerns including headache, left arm and leg pain with associated numbness and weakness, and left-sided chest pain.  Upon admission to the ED patient was found with recurrent hypotension. Cardiology was consulted for further evaluation.   # Asymptomatic Hypotension # Nonischemic cardiomyopathy s/p AICD (2018) # Chronic systolic and diastolic heart failure Patient reported to the ED with concerns for headache, left arm and neck pain. Patient denies chest pain or SOB. Upon arrival to ED patient found to be hypotensive without lightheadedness, dizziness, or syncope. Patient recently had TKR procedure on 04/14. LE US  shows no evidence of DVT. V/Q scan pending.  Lactic acid elevated and trending 2.1 > 2.8. D-dimer elevated at 2.89.  EKG in the ED with sinus rhythm, first-degree AV block, prolonged QTc 507 ms, rate 70 bpm.  Troponin is negative x 2.  BNP normal at 18. Hgb today 9.7 and stable. Reported remote hx of paroxymal AF per chart review, no EKGs available with evidence of AF, patient has remained in sinus rhythm per ED EKG and tele. BP has improved s/p IVF. -Echo ordered -Of note, patient recently admitted  to ED on 4/2-4/4 for hypotension and improved with IV fluids. Continue IVF as appears euvolemic on exam and not in heart failure exacerbation.  -Lovenox  injections for DVT PPX. Was taking asa 81 mg bid at home due to recent TKA which ortho recommends be continued for 6 weeks post-op. Recommend resuming at this dose on discharge. Will given asa 81 mg daily for now. -Resume home metoprolol  succinate 50 mg daily. Hold if HR < 50 and BP < 100/60.  -Defer resuming home losartan  at this time as BP is borderline and Cr up.  -Defer resuming home torsemide  and spironolactone  at this time as Cr is up this AM. Patients HF appears stable. Will try to resume as able.    This patient's plan of care was discussed and created with Dr. Beau Bound and he is in agreement.  Signed: Hamp Levine, PA-C  12/27/2023, 10:33 AM [facility]

## 2023-12-27 NOTE — Progress Notes (Signed)
 SLP Cancellation Note  Patient Details Name: Brittany Nguyen MRN: [id number] DOB: 21-Nov-1965   Cancelled treatment:       Reason Eval/Treat Not Completed: SLP screened, no needs identified, will sign off (chart reviewed)   Per chart review, "pt reports that she is feeling completely back to normal and that she has no residual L UE weakness, no issues with coordination using phone, etc. She reports no other issues with visual, verbal, mobility or other concerns. Pt's husband present and confirms she appears at her normal in all aspects.". Pt is on a Regular diet, thins w/ no reported difficulty swallowing by NSG. Pt is conversing w/ Team members during assessments.  No further skilled ST services indicated as pt appears at her baseline. MD/NSG to reconsult if any change in status while admitted.       Darla Edward, MS, CCC-SLP Speech Language Pathologist Rehab Services; [facility] [phone] (ascom) Xoe Hoe 12/27/2023, 5:25 PM

## 2023-12-27 NOTE — ED Triage Notes (Signed)
 Pt arrives via EMS from home - complaints of chest pain radiating to left arm and neck - pt states started suddenly, woke her up out of her sleep. Per EMS BP hypotensive on scene 72/42, 500ml LR given brought up to 89/50.

## 2023-12-27 NOTE — Progress Notes (Addendum)
 CODE SEPSIS - PHARMACY COMMUNICATION  **Broad Spectrum Antibiotics should be administered within 1 hour of Sepsis diagnosis**  Time Code Sepsis Called/Page Received: 4/23 @ 0516   Antibiotics Ordered: metronidazole , aztreonam , vancomycin    Time of 1st antibiotic administration: metronidazole  500 mg IV x 1 on 0541   Additional action taken by pharmacy:   If necessary, Name of Provider/Nurse Contacted:     Nhu Glasby D ,PharmD Clinical Pharmacist  12/27/2023  5:48 AM

## 2023-12-27 NOTE — Care Management Obs Status (Signed)
 MEDICARE OBSERVATION STATUS NOTIFICATION   Patient Details  Name: Brittany Nguyen MRN: [id number] Date of Birth: 10-12-1965   Medicare Observation Status Notification Given:  Yes    Nellene Banana Gurveer Colucci, RN 12/27/2023, 2:40 PM

## 2023-12-27 NOTE — Assessment & Plan Note (Addendum)
 Systolic pressures 70s to 80s on presentation Noted recurring issue with recent admission for similar issues April 2 through April 4 Suspect likely secondary to diuretic use with decreased p.o. intake as well as antihypertensives BP improving with IV fluids Will hold offending medications 2D echo in the setting of nonischemic cardiomyopathy Continue MIVF  Monitor volume status in setting of HFrEF  Follow

## 2023-12-27 NOTE — Assessment & Plan Note (Signed)
 2D ECHO 01/2018 w/ EF 45% and grade 1 diastolic dysfunction  Appears fairly euvolemic at present Will check 2D echo x 1 in the setting of recurrent hypotension Consult cardiology for further management Follow

## 2023-12-27 NOTE — Assessment & Plan Note (Signed)
 Stable from a respiratory standpoint

## 2023-12-27 NOTE — ED Notes (Signed)
 Pt asking if she can take her requip  4mg  she takes nightly. Provider notified.

## 2023-12-27 NOTE — Assessment & Plan Note (Signed)
 Blood sugars in 80s SSI  Monitor

## 2023-12-27 NOTE — Assessment & Plan Note (Signed)
 Mild generalized lethargy on presentation Suspect likely multifactorial with contributions of multiple sedating medications No overt infection noted at on exam Noted transient left-sided arm weakness that has now resolved CT head within normal limits Pending formal TIA evaluation but with contraindication to MRI imaging in the setting of pacemaker placement as well as contrasted studies with severe prior allergy Will otherwise monitor for now Neurology consultation as appropriate

## 2023-12-27 NOTE — Assessment & Plan Note (Signed)
 Baseline history of CAD Noted concurrent nonspecific chest pain Troponin negative x 2 EKG grossly stable Continue home regimen Monitor

## 2023-12-27 NOTE — Assessment & Plan Note (Addendum)
 Noted prior hx/o DVT s/p IVC filter placement  LE u/s and VQ scan pending pending  On treatment dose lovenox  in the interim

## 2023-12-27 NOTE — Assessment & Plan Note (Signed)
 Continue synthroid.

## 2023-12-28 ENCOUNTER — Observation Stay
Admit: 2023-12-28 | Discharge: 2023-12-28 | Disposition: A | Discharge status: Home / Self Care | Attending: Family Medicine | Admitting: Family Medicine

## 2023-12-28 DIAGNOSIS — I428 Other cardiomyopathies: Secondary | ICD-10-CM | POA: Diagnosis not present

## 2023-12-28 DIAGNOSIS — I5022 Chronic systolic (congestive) heart failure: Secondary | ICD-10-CM | POA: Diagnosis not present

## 2023-12-28 DIAGNOSIS — R079 Chest pain, unspecified: Secondary | ICD-10-CM | POA: Diagnosis not present

## 2023-12-28 DIAGNOSIS — G459 Transient cerebral ischemic attack, unspecified: Secondary | ICD-10-CM | POA: Diagnosis not present

## 2023-12-28 DIAGNOSIS — I959 Hypotension, unspecified: Secondary | ICD-10-CM | POA: Diagnosis not present

## 2023-12-28 LAB — ECHOCARDIOGRAM COMPLETE
AR max vel: 2.16 cm2
AV Area VTI: 2.38 cm2
AV Area mean vel: 2.16 cm2
AV Mean grad: 2 mmHg
AV Peak grad: 4 mmHg
Ao pk vel: 1 m/s
Area-P 1/2: 4.8 cm2
Calc EF: 46.7 %
Height: 65 in
S' Lateral: 3.7 cm
Single Plane A2C EF: 57.2 %
Single Plane A4C EF: 44.9 %
Weight: 3594.38 [oz_av]

## 2023-12-28 LAB — BASIC METABOLIC PANEL WITH GFR
Anion gap: 13 (ref 5–15)
BUN: 15 mg/dL (ref 6–20)
CO2: 29 mmol/L (ref 22–32)
Calcium: 8 mg/dL — ABNORMAL LOW (ref 8.9–10.3)
Chloride: 99 mmol/L (ref 98–111)
Creatinine, Ser: 1.08 mg/dL — ABNORMAL HIGH (ref 0.44–1.00)
GFR, Estimated: >60 mL/min (ref >60)
Glucose, Bld: 129 mg/dL — ABNORMAL HIGH (ref 70–99)
Potassium: 3.8 mmol/L (ref 3.5–5.1)
Sodium: 141 mmol/L (ref 135–145)

## 2023-12-28 LAB — CBC
HCT: 30.4 % — ABNORMAL LOW (ref 36.0–46.0)
Hemoglobin: 9.5 g/dL — ABNORMAL LOW (ref 12.0–15.0)
MCH: 27.5 pg (ref 26.0–34.0)
MCHC: 31.3 g/dL (ref 30.0–36.0)
MCV: 87.9 fL (ref 80.0–100.0)
Platelets: 221 10*3/uL (ref 150–400)
RBC: 3.46 MIL/uL — ABNORMAL LOW (ref 3.87–5.11)
RDW: 16 % — ABNORMAL HIGH (ref 11.5–15.5)
WBC: 7 10*3/uL (ref 4.0–10.5)
nRBC: 0 % (ref 0.0–0.2)

## 2023-12-28 LAB — LIPID PANEL
Cholesterol: 257 mg/dL — ABNORMAL HIGH (ref 0–200)
HDL: 50 mg/dL (ref >40)
LDL Cholesterol: 164 mg/dL — ABNORMAL HIGH (ref 0–99)
Total CHOL/HDL Ratio: 5.1 ratio
Triglycerides: 217 mg/dL — ABNORMAL HIGH (ref <150)
VLDL: 43 mg/dL — ABNORMAL HIGH (ref 0–40)

## 2023-12-28 MED ORDER — LOSARTAN POTASSIUM 25 MG PO TABS
12.5000 mg | ORAL_TABLET | Freq: Every day | ORAL | Status: DC
  Administered 2023-12-28: 12.5 mg via ORAL
  Filled 2023-12-28: qty 0.5

## 2023-12-28 NOTE — Discharge Summary (Signed)
 Physician Discharge Summary   Patient: Brittany Nguyen MRN: [id number] DOB: 03-14-1966  Admit date:     12/27/2023  Discharge date: 12/28/23  Discharge Physician: Ezzard Holms   PCP: Lyle San, MD   Recommendations at discharge:  Follow-up with cardiology as well as PCP  Discharge Diagnoses: Acute metabolic encephalopathy in the setting of hypotension Chest pain secondary to musculoskeletal  OSA on CPAP Hypothyroidism Type 2 diabetes mellitus without complication (HCC) Recurrent seizures (HCC) COPD (chronic obstructive pulmonary disease) (HCC) CAD (coronary artery disease) HFrEF (heart failure with reduced ejection fraction) (HCC) Mild intermittent asthma without complication History of DVT (deep vein thrombosis) GERD  Hospital Course:  Brittany Nguyen is a 58 y.o. female with medical history significant of hypothyroid, anxiety, hypertension, restless leg syndrome, history of DVT status post IVC filter placement, CAD status post AICD, seizure, hypertension, COPD, anxiety presenting with hypotension, chest pain, left arm weakness.  Patient reports waking up with left-sided arm weakness.  No slurred speech or confusion.  Weakness waxing waning.  CT head and chest x-ray stable.  CT chest abdomen pelvis grossly stable apart from subacute fractures of the right 4th, 5th and 6th as well as seventh ribs.  Tiny nodule within the right lower lobe measuring 3 mm.  Troponin negative x 2.  EKG sinus rhythm with first-degree AV block.  Patient was seen by cardiology and was cleared for discharge today and will follow-up as an outpatient.  Patient did not have any more weakness and able to walk about with no complaints.   Consultants: Cardiology Procedures performed: None Disposition: Home Diet recommendation:  Cardiac diet DISCHARGE MEDICATION: Allergies as of 12/28/2023       Reactions   Contrast Media [iodinated Contrast Media] Shortness Of Breath   Per CT report in 2014 pt  had break through contrast reaction with hives and SOB following 13 hour prep. MSY    Ferumoxytol  Nausea Only   Iron  Sucrose Anaphylaxis   Lidocaine  Hives   Metrizamide Shortness Of Breath   Penicillins Hives, Other (See Comments)   Tolerated ceftriaxone  in past IgE = 4 (WNL) on 08/12/2022 ediate rash, facial/tongue/throat swelling, SOB or lightheadedness with hypotension, tachy   Sacubitril-valsartan    Sz (unclear if new start entresto vs hypoglycemia)   Isosorbide Nitrate Other (See Comments)   Headache   Latex Hives   IgE < 0.10 (WNL) on 08/21/2023   Ondansetron  Other (See Comments)   Severe headache   Tizanidine  Other (See Comments)   Feels altered   Povidone-iodine  Rash   Blistering rash   Pulmicort  [budesonide ] Itching        Medication List     STOP taking these medications    lacosamide  200 MG Tabs tablet Commonly known as: VIMPAT    spironolactone  50 MG tablet Commonly known as: ALDACTONE    torsemide  20 MG tablet Commonly known as: DEMADEX        TAKE these medications    aspirin  81 MG chewable tablet Chew 1 tablet (81 mg total) by mouth 2 (two) times daily.   celecoxib  200 MG capsule Commonly known as: CELEBREX  Take 200 mg by mouth 2 (two) times daily.   escitalopram  10 MG tablet Commonly known as: LEXAPRO  Take 10 mg by mouth daily.   gabapentin  300 MG capsule Commonly known as: NEURONTIN  Take 1 capsule (300 mg total) by mouth 3 (three) times daily.   levothyroxine  175 MCG tablet Commonly known as: SYNTHROID  Take 1 tablet (175 mcg total) by mouth daily at 6 (  six) AM.   LORazepam  1 MG tablet Commonly known as: ATIVAN  Take 1 tablet (1 mg total) by mouth 4 (four) times daily as needed.   losartan  25 MG tablet Commonly known as: COZAAR  Take 12.5 mg by mouth 2 (two) times daily.   methadone  5 MG tablet Commonly known as: DOLOPHINE  Take 5 mg by mouth See admin instructions. Patient takes 1.5 tabs (7.5 mg) every morning, and 1.5 tabs (7.5 mg)  midday,  and 2 tabs (10 mg) at bedtime.   metoprolol  succinate 50 MG 24 hr tablet Commonly known as: TOPROL -XL Take 50 mg by mouth daily.   oxyCODONE -acetaminophen  10-325 MG tablet Commonly known as: PERCOCET Take 1 tablet by mouth every 4 (four) hours as needed for pain.   Potassium Chloride  ER 20 MEQ Tbcr Take 20 mEq by mouth daily.   promethazine  25 MG tablet Commonly known as: PHENERGAN  Take 25 mg by mouth every 4 (four) hours as needed for nausea or vomiting.   promethazine -dextromethorphan  6.25-15 MG/5ML syrup Commonly known as: PROMETHAZINE -DM Take 5 mLs by mouth every 6 (six) hours as needed for cough.   rOPINIRole  4 MG tablet Commonly known as: REQUIP  Take 4 mg by mouth at bedtime.   tiZANidine  4 MG capsule Commonly known as: ZANAFLEX  Take 4 mg by mouth 3 (three) times daily as needed for muscle spasms.        Follow-up Information     Dennison Fix, NP. Go in 1 week(s).   Specialty: Cardiology Contact information: [address] [place] [address] [phone]                Discharge Exam: Cleavon Curls Weights   12/27/23 0453  Weight: 101.9 kg   General: Not in acute distress Eyes:     Pupils: Pupils are equal, round, and reactive to light.  Cardiovascular:     Rate and Rhythm: Normal rate and regular rhythm.  Pulmonary:     Effort: Pulmonary effort is normal.  Abdominal:     General: Bowel sounds are normal.  Musculoskeletal:        General: Normal range of motion.  Skin:    General: Skin is warm.  Neurological: Alert and oriented x 3 moving all extremities with no weakness Condition at discharge: good  The results of significant diagnostics from this hospitalization (including imaging, microbiology, ancillary and laboratory) are listed below for reference.   Imaging Studies: ECHOCARDIOGRAM COMPLETE Result Date: 12/28/2023    ECHOCARDIOGRAM REPORT   Patient Name:   Brittany Nguyen Date of Exam:  12/28/2023 Medical Rec #:  [id number]           Height:       65.0 in Accession #:    [id number]          Weight:       224.6 lb Date of Birth:  03/24/1966           BSA:          2.078 m Patient Age:    57 years            BP:           105/69 mmHg Patient Gender: F                   HR:           75 bpm. Exam Location:  [facility] Procedure: 2D Echo, Cardiac Doppler, Color Doppler and Saline Contrast Bubble  Study (Both Spectral and Color Flow Doppler were utilized during            procedure). Indications:     TIA G45.9  History:         Patient has prior history of Echocardiogram examinations, most                  recent 01/30/2018. AICD; Risk Factors:Hypertension.  Sonographer:     Broadus Canes Referring Phys:  [id number] Alayne Allis [place] Diagnosing Phys: Antonette Batters MD IMPRESSIONS  1. Negative bubble study.  2. Left ventricular ejection fraction, by estimation, is 45 to 50%. The left ventricle has mildly decreased function. The left ventricle demonstrates global hypokinesis. There is mild left ventricular hypertrophy. Left ventricular diastolic parameters are consistent with Grade III diastolic dysfunction (restrictive).  3. Right ventricular systolic function is normal. The right ventricular size is normal.  4. The mitral valve is normal in structure. No evidence of mitral valve regurgitation.  5. The aortic valve is normal in structure. Aortic valve regurgitation is not visualized. FINDINGS  Left Ventricle: Left ventricular ejection fraction, by estimation, is 45 to 50%. The left ventricle has mildly decreased function. The left ventricle demonstrates global hypokinesis. Strain was performed and the global longitudinal strain is indeterminate. The left ventricular internal cavity size was normal in size. There is mild left ventricular hypertrophy. Left ventricular diastolic parameters are consistent with Grade III diastolic dysfunction (restrictive). Right Ventricle: The right ventricular size is normal. No  increase in right ventricular wall thickness. Right ventricular systolic function is normal. Left Atrium: Left atrial size was normal in size. Right Atrium: Right atrial size was normal in size. Pericardium: There is no evidence of pericardial effusion. Mitral Valve: The mitral valve is normal in structure. No evidence of mitral valve regurgitation. Tricuspid Valve: The tricuspid valve is normal in structure. Tricuspid valve regurgitation is trivial. Aortic Valve: The aortic valve is normal in structure. Aortic valve regurgitation is not visualized. Aortic valve mean gradient measures 2.0 mmHg. Aortic valve peak gradient measures 4.0 mmHg. Aortic valve area, by VTI measures 2.38 cm. Pulmonic Valve: The pulmonic valve was normal in structure. Pulmonic valve regurgitation is not visualized. Aorta: The ascending aorta was not well visualized. IAS/Shunts: No atrial level shunt detected by color flow Doppler. Agitated saline contrast was given intravenously to evaluate for intracardiac shunting. Additional Comments: Negative bubble study. 3D was performed not requiring image post processing on an independent workstation and was indeterminate.  LEFT VENTRICLE PLAX 2D LVIDd:         4.80 cm     Diastology LVIDs:         3.70 cm     LV e' medial:    14.60 cm/s LV PW:         1.30 cm     LV E/e' medial:  6.5 LV IVS:        1.20 cm     LV e' lateral:   12.50 cm/s LVOT diam:     2.00 cm     LV E/e' lateral: 7.6 LV SV:         41 LV SV Index:   20 LVOT Area:     3.14 cm  LV Volumes (MOD) LV vol d, MOD A2C: 50.9 ml LV vol d, MOD A4C: 73.0 ml LV vol s, MOD A2C: 21.8 ml LV vol s, MOD A4C: 40.2 ml LV SV MOD A2C:     29.1 ml LV SV MOD A4C:  73.0 ml LV SV MOD BP:      29.1 ml RIGHT VENTRICLE RV Basal diam:  3.60 cm RV Mid diam:    2.90 cm LEFT ATRIUM           Index        RIGHT ATRIUM           Index LA diam:      3.10 cm 1.49 cm/m   RA Area:     11.50 cm LA Vol (A2C): 18.9 ml 9.09 ml/m   RA Volume:   24.00 ml  11.55 ml/m  LA Vol (A4C): 52.5 ml 25.26 ml/m  AORTIC VALVE AV Area (Vmax):    2.16 cm AV Area (Vmean):   2.16 cm AV Area (VTI):     2.38 cm AV Vmax:           100.00 cm/s AV Vmean:          70.000 cm/s AV VTI:            0.174 m AV Peak Grad:      4.0 mmHg AV Mean Grad:      2.0 mmHg LVOT Vmax:         68.70 cm/s LVOT Vmean:        48.100 cm/s LVOT VTI:          0.132 m LVOT/AV VTI ratio: 0.76  AORTA Ao Root diam: 2.90 cm MITRAL VALVE               TRICUSPID VALVE MV Area (PHT): 4.80 cm    TR Peak grad:   12.4 mmHg MV Decel Time: 158 msec    TR Vmax:        176.00 cm/s MV E velocity: 95.40 cm/s MV A velocity: 34.10 cm/s  SHUNTS MV E/A ratio:  2.80        Systemic VTI:  0.13 m                            Systemic Diam: 2.00 cm Antonette Batters MD Electronically signed by Antonette Batters MD Signature Date/Time: 12/28/2023/2:07:46 PM    Final    NM Pulmonary Perfusion Result Date: 12/27/2023 CLINICAL DATA:  High probability for acute pulmonary embolism. Knee surgery 3 weeks ago. EXAM: NUCLEAR MEDICINE PERFUSION LUNG SCAN TECHNIQUE: Perfusion images were obtained in multiple projections after intravenous injection of radiopharmaceutical. Ventilation scans intentionally deferred if perfusion scan and chest x-ray adequate for interpretation during COVID 19 epidemic. RADIOPHARMACEUTICALS:  4.44 mCi Tc-33m MAA IV COMPARISON:  CT chest abdomen and pelvis 12/27/2023 FINDINGS: There is a uniform distribution of the radiopharmaceutical throughout both lungs. No segmental perfusion defects identified. IMPRESSION: Negative for acute pulmonary embolus. Electronically Signed   By: Kimberley Penman M.D.   On: 12/27/2023 11:59   US  Venous Img Lower Bilateral (DVT) Result Date: 12/27/2023 CLINICAL DATA:  Swelling lower extremity. Right knee replacement 3 4 weeks ago EXAM: BILATERAL LOWER EXTREMITY VENOUS DOPPLER ULTRASOUND TECHNIQUE: Gray-scale sonography with graded compression, as well as color Doppler and duplex ultrasound were  performed to evaluate the lower extremity deep venous systems from the level of the common femoral vein and including the common femoral, femoral, profunda femoral, popliteal and calf veins including the posterior tibial, peroneal and gastrocnemius veins when visible. The superficial great saphenous vein was also interrogated. Spectral Doppler was utilized to evaluate flow at rest and with distal augmentation maneuvers in the common femoral, femoral  and popliteal veins. COMPARISON:  Ultrasound 06/07/2023 FINDINGS: RIGHT LOWER EXTREMITY Common Femoral Vein: No evidence of thrombus. Normal compressibility, respiratory phasicity and response to augmentation. Saphenofemoral Junction: No evidence of thrombus. Normal compressibility and flow on color Doppler imaging. Profunda Femoral Vein: No evidence of thrombus. Normal compressibility and flow on color Doppler imaging. Femoral Vein: No evidence of thrombus. Normal compressibility, respiratory phasicity and response to augmentation. Popliteal Vein: No evidence of thrombus. Normal compressibility, respiratory phasicity and response to augmentation. Calf Veins: No evidence of thrombus. Normal compressibility and flow on color Doppler imaging. Superficial Great Saphenous Vein: No evidence of thrombus. Normal compressibility. Venous Reflux:  None. Other Findings:  None. LEFT LOWER EXTREMITY Common Femoral Vein: No evidence of thrombus. Normal compressibility, respiratory phasicity and response to augmentation. Saphenofemoral Junction: No evidence of thrombus. Normal compressibility and flow on color Doppler imaging. Profunda Femoral Vein: No evidence of thrombus. Normal compressibility and flow on color Doppler imaging. Femoral Vein: No evidence of thrombus. Normal compressibility, respiratory phasicity and response to augmentation. Popliteal Vein: No evidence of thrombus. Normal compressibility, respiratory phasicity and response to augmentation. Calf Veins: No evidence of  thrombus. Normal compressibility and flow on color Doppler imaging. Superficial Great Saphenous Vein: No evidence of thrombus. Normal compressibility. Venous Reflux:  None. Other Findings:  None. IMPRESSION: No evidence of deep venous thrombosis in either lower extremity. Electronically Signed   By: Adrianna Horde M.D.   On: 12/27/2023 10:51   CT CHEST ABDOMEN PELVIS WO CONTRAST Result Date: 12/27/2023 CLINICAL DATA:  Septicemia. Chest pain radiating to left arm and neck. EXAM: CT CHEST, ABDOMEN AND PELVIS WITHOUT CONTRAST TECHNIQUE: Multidetector CT imaging of the chest, abdomen and pelvis was performed following the standard protocol without IV contrast. RADIATION DOSE REDUCTION: This exam was performed according to the departmental dose-optimization program which includes automated exposure control, adjustment of the mA and/or kV according to patient size and/or use of iterative reconstruction technique. COMPARISON:  12/06/2023 FINDINGS: CT CHEST FINDINGS Cardiovascular: Left chest wall ICD with leads in the right ventricle and cavoatrial junction. Heart size is mildly enlarged. No pericardial effusion. Mediastinum/Nodes: No enlarged mediastinal, hilar, or axillary lymph nodes. Thyroid  gland, trachea, and esophagus demonstrate no significant findings. Lungs/Pleura: Central airways are patent. No pleural effusion, airspace consolidation, atelectasis or pneumothorax. Dependent changes are noted along the posterior lower lobes no atelectasis or pneumothorax. Stable tiny nodule within the posterior right lower lobe measuring 3 mm, image 104/4. Musculoskeletal: Subacute fracture deformities involving the right fourth, fifth, sixth, and seventh ribs are again noted and appear unchanged. No acute findings. CT ABDOMEN PELVIS FINDINGS Hepatobiliary: Mild steatosis within the liver. No focal liver abnormality. Status post cholecystectomy. No bile duct dilatation. Pancreas: Unremarkable. No pancreatic ductal dilatation or  surrounding inflammatory changes. Spleen: Normal in size without focal abnormality. Adrenals/Urinary Tract: Normal adrenal glands. No nephrolithiasis, hydronephrosis or mass. Urinary bladder appears within normal limits. Stomach/Bowel: Stomach is normal. No pathologic dilatation of the large or small bowel loops. Sigmoid anastomosis identified within the pelvis. The appendix is not visualized and may be surgically absent. No secondary signs of acute appendicitis. Vascular/Lymphatic: Normal appearance of the abdominal aorta. IVC filter in place. No signs of abdominopelvic adenopathy. Reproductive: Status post hysterectomy. No adnexal masses. Other: No free fluid or fluid collections. No signs of pneumoperitoneum. Musculoskeletal: No acute or significant osseous findings. IMPRESSION: 1. No acute findings within the chest, abdomen or pelvis. 2. Subacute fracture deformities involving the right fourth, fifth, sixth, and seventh ribs are again noted  and appear unchanged. 3. Mild hepatic steatosis. 4. Stable tiny nodule within the posterior right lower lobe measuring 3 mm. If the patient is at high risk for bronchogenic carcinoma, follow-up chest CT at 1 year is recommended. If the patient is at low risk, no follow-up is needed. This recommendation follows the consensus statement: Guidelines for Management of Small Pulmonary Nodules Detected on CT Scans: A Statement from the Fleischner Society as published in Radiology 2005; 237:395-400. Electronically Signed   By: Kimberley Penman M.D.   On: 12/27/2023 08:31   CT HEAD WO CONTRAST Result Date: 12/27/2023 CLINICAL DATA:  Neuro deficit with stroke suspected.  Hypotension EXAM: CT HEAD WITHOUT CONTRAST TECHNIQUE: Contiguous axial images were obtained from the base of the skull through the vertex without intravenous contrast. RADIATION DOSE REDUCTION: This exam was performed according to the departmental dose-optimization program which includes automated exposure control,  adjustment of the mA and/or kV according to patient size and/or use of iterative reconstruction technique. COMPARISON:  12/06/2023 FINDINGS: Brain: No evidence of acute infarction, hemorrhage, hydrocephalus, extra-axial collection or mass lesion/mass effect. Vascular: No hyperdense vessel or unexpected calcification. Skull: Normal. Negative for fracture or focal lesion. Sinuses/Orbits: Prior endoscopic sinus surgery. A small left sphenoid sinus fluid level is again seen, with patent ostium. IMPRESSION: Stable, normal appearance of the brain. Electronically Signed   By: Ronnette Coke M.D.   On: 12/27/2023 06:26   DG Chest Port 1 View Result Date: 12/27/2023 CLINICAL DATA:  Chest pain EXAM: PORTABLE CHEST 1 VIEW COMPARISON:  12/06/2023 FINDINGS: Dual-chamber ICD/pacer leads in stable position. Chronic cardiopericardial enlargement and low lung volumes. There is no edema, consolidation, effusion, or pneumothorax. IMPRESSION: Stable exam.  No evidence of acute disease. Electronically Signed   By: Ronnette Coke M.D.   On: 12/27/2023 05:30   CT CHEST WO CONTRAST Result Date: 12/06/2023 CLINICAL DATA:  Hypotension of unclear source. Recent right knee surgery. EXAM: CT CHEST, ABDOMEN AND PELVIS WITHOUT CONTRAST TECHNIQUE: Multidetector CT imaging of the chest, abdomen and pelvis was performed following the standard protocol without IV contrast. RADIATION DOSE REDUCTION: This exam was performed according to the departmental dose-optimization program which includes automated exposure control, adjustment of the mA and/or kV according to patient size and/or use of iterative reconstruction technique. COMPARISON:  Chest CT 03/08/2023.  CT abdomen and pelvis 12/15/2021. FINDINGS: CT CHEST FINDINGS Cardiovascular: The heart is mildly enlarged. There is no pericardial effusion. Left-sided pacemaker is present. Aorta is normal in size. Mediastinum/Nodes: No enlarged mediastinal or axillary lymph nodes. Trachea and esophagus  are within normal limits. There surgical clips in the region of the thyroid . Lungs/Pleura: There is a 3 mm nodule in the right lung base image 4/105 which is unchanged from 2023. The lungs are otherwise clear. No pleural effusion or pneumothorax. Musculoskeletal: There subacute appearing right anterolateral fourth through seventh rib fractures. CT ABDOMEN PELVIS FINDINGS Hepatobiliary: No focal liver abnormality is seen. Status post cholecystectomy. No biliary dilatation. Pancreas: Unremarkable. No pancreatic ductal dilatation or surrounding inflammatory changes. Spleen: Normal in size without focal abnormality. Adrenals/Urinary Tract: Adrenal glands are unremarkable. Kidneys are normal, without renal calculi, focal lesion, or hydronephrosis. Bladder is unremarkable. Stomach/Bowel: Sigmoid colon anastomosis present. Scattered colonic diverticula present. Vascular/Lymphatic: IVC filter in place inferior to the level of the renal veins. Aorta and IVC are normal in size. No enlarged lymph nodes are identified. Reproductive: Status post hysterectomy. No adnexal masses. Other: No abdominal wall hernia or abnormality. No abdominopelvic ascites. Musculoskeletal: No acute or significant  osseous findings. IMPRESSION: 1. Subacute appearing right anterolateral fourth through seventh rib fractures. 2. No acute localizing process in the abdomen or pelvis. 3. Colonic diverticulosis. 4. IVC filter in place. 5. Stable 3 mm nodule in the right lung base. No follow-up needed if patient is low-risk.This recommendation follows the consensus statement: Guidelines for Management of Incidental Pulmonary Nodules Detected on CT Images: From the Fleischner Society 2017; Radiology 2017; 284:228-243. Electronically Signed   By: Tyron Gallon M.D.   On: 12/06/2023 16:03   CT ABDOMEN PELVIS WO CONTRAST Result Date: 12/06/2023 CLINICAL DATA:  Hypotension of unclear source. Recent right knee surgery. EXAM: CT CHEST, ABDOMEN AND PELVIS WITHOUT  CONTRAST TECHNIQUE: Multidetector CT imaging of the chest, abdomen and pelvis was performed following the standard protocol without IV contrast. RADIATION DOSE REDUCTION: This exam was performed according to the departmental dose-optimization program which includes automated exposure control, adjustment of the mA and/or kV according to patient size and/or use of iterative reconstruction technique. COMPARISON:  Chest CT 03/08/2023.  CT abdomen and pelvis 12/15/2021. FINDINGS: CT CHEST FINDINGS Cardiovascular: The heart is mildly enlarged. There is no pericardial effusion. Left-sided pacemaker is present. Aorta is normal in size. Mediastinum/Nodes: No enlarged mediastinal or axillary lymph nodes. Trachea and esophagus are within normal limits. There surgical clips in the region of the thyroid . Lungs/Pleura: There is a 3 mm nodule in the right lung base image 4/105 which is unchanged from 2023. The lungs are otherwise clear. No pleural effusion or pneumothorax. Musculoskeletal: There subacute appearing right anterolateral fourth through seventh rib fractures. CT ABDOMEN PELVIS FINDINGS Hepatobiliary: No focal liver abnormality is seen. Status post cholecystectomy. No biliary dilatation. Pancreas: Unremarkable. No pancreatic ductal dilatation or surrounding inflammatory changes. Spleen: Normal in size without focal abnormality. Adrenals/Urinary Tract: Adrenal glands are unremarkable. Kidneys are normal, without renal calculi, focal lesion, or hydronephrosis. Bladder is unremarkable. Stomach/Bowel: Sigmoid colon anastomosis present. Scattered colonic diverticula present. Vascular/Lymphatic: IVC filter in place inferior to the level of the renal veins. Aorta and IVC are normal in size. No enlarged lymph nodes are identified. Reproductive: Status post hysterectomy. No adnexal masses. Other: No abdominal wall hernia or abnormality. No abdominopelvic ascites. Musculoskeletal: No acute or significant osseous findings.  IMPRESSION: 1. Subacute appearing right anterolateral fourth through seventh rib fractures. 2. No acute localizing process in the abdomen or pelvis. 3. Colonic diverticulosis. 4. IVC filter in place. 5. Stable 3 mm nodule in the right lung base. No follow-up needed if patient is low-risk.This recommendation follows the consensus statement: Guidelines for Management of Incidental Pulmonary Nodules Detected on CT Images: From the Fleischner Society 2017; Radiology 2017; 284:228-243. Electronically Signed   By: Tyron Gallon M.D.   On: 12/06/2023 16:03   CT HEAD WO CONTRAST ( ) Result Date: 12/06/2023 CLINICAL DATA:  Altered mental status. EXAM: CT HEAD WITHOUT CONTRAST TECHNIQUE: Contiguous axial images were obtained from the base of the skull through the vertex without intravenous contrast. RADIATION DOSE REDUCTION: This exam was performed according to the departmental dose-optimization program which includes automated exposure control, adjustment of the mA and/or kV according to patient size and/or use of iterative reconstruction technique. COMPARISON:  Head CT 10/06/2023.  MRI brain 09/25/2023. FINDINGS: Brain: No evidence of acute infarction, hemorrhage, hydrocephalus, extra-axial collection or mass lesion/mass effect. Vascular: No hyperdense vessel or unexpected calcification. Skull: Normal. Negative for fracture or focal lesion. Sinuses/Orbits: There is a small air-fluid level in the left sphenoid sinus. Patient is status post sinonasal surgery bilaterally. Orbits are within normal  limits. Other: None. IMPRESSION: 1. No acute intracranial process. 2. Small air-fluid level in the left sphenoid sinus. Correlate for acute sinusitis. Electronically Signed   By: Tyron Gallon M.D.   On: 12/06/2023 15:49   DG Chest Port 1 View Result Date: 12/06/2023 CLINICAL DATA:  Concern for sepsis. EXAM: PORTABLE CHEST 1 VIEW COMPARISON:  Chest radiograph dated 12/20/2023. FINDINGS: Low lung volumes with associated  accentuation of the cardiac silhouette. Stable left subclavian dual lead cardiac device in place. No focal consolidation, sizeable pleural effusion, or pneumothorax. No acute osseous abnormality. IMPRESSION: Low lung volumes.  No acute cardiopulmonary findings. Electronically Signed   By: Mannie Seek M.D.   On: 12/06/2023 14:13   DG Knee Right Port Result Date: 12/01/2023 CLINICAL DATA:  Status post right knee arthroplasty. EXAM: PORTABLE RIGHT KNEE - 1-2 VIEW COMPARISON:  Right knee radiographs 05/13/2018 FINDINGS: Interval total right knee arthroplasty. No perihardware lucency is seen to indicate hardware failure or loosening. Expected postoperative changes including intra-articular and subcutaneous air. Smalljoint effusion. Anterior surgical skin staples. There is a superior approach surgical drain overlying the anterior femorotibial joint space. Old screw tracts are seen within the distal femoral diaphysis and proximal tibial diaphysis. No acute fracture or dislocation. IMPRESSION: Interval total right knee arthroplasty without evidence of hardware failure. Electronically Signed   By: Bertina Broccoli M.D.   On: 12/01/2023 13:09    Microbiology: Results for orders placed or performed during the hospital encounter of 12/27/23  Resp panel by RT-PCR (RSV, Flu A&B, Covid) Anterior Nasal Swab     Status: None   Collection Time: 12/27/23  5:19 AM   Specimen: Anterior Nasal Swab  Result Value Ref Range Status   SARS Coronavirus 2 by RT PCR NEGATIVE NEGATIVE Final    Comment: (NOTE) SARS-CoV-2 target nucleic acids are NOT DETECTED.  The SARS-CoV-2 RNA is generally detectable in upper respiratory specimens during the acute phase of infection. The lowest concentration of SARS-CoV-2 viral copies this assay can detect is 138 copies/mL. A negative result does not preclude SARS-Cov-2 infection and should not be used as the sole basis for treatment or other patient management decisions. A negative result  may occur with  improper specimen collection/handling, submission of specimen other than nasopharyngeal swab, presence of viral mutation(s) within the areas targeted by this assay, and inadequate number of viral copies(<138 copies/mL). A negative result must be combined with clinical observations, patient history, and epidemiological information. The expected result is Negative.  Fact Sheet for Patients:  BloggerCourse.com  Fact Sheet for Healthcare Providers:  SeriousBroker.it  This test is no t yet approved or cleared by the [place]  FDA and  has been authorized for detection and/or diagnosis of SARS-CoV-2 by FDA under an Emergency Use Authorization (EUA). This EUA will remain  in effect (meaning this test can be used) for the duration of the COVID-19 declaration under Section 564(b)(1) of the Act, 21 U.S.C.section 360bbb-3(b)(1), unless the authorization is terminated  or revoked sooner.       Influenza A by PCR NEGATIVE NEGATIVE Final   Influenza B by PCR NEGATIVE NEGATIVE Final    Comment: (NOTE) The Xpert Xpress SARS-CoV-2/FLU/RSV plus assay is intended as an aid in the diagnosis of influenza from Nasopharyngeal swab specimens and should not be used as a sole basis for treatment. Nasal washings and aspirates are unacceptable for Xpert Xpress SARS-CoV-2/FLU/RSV testing.  Fact Sheet for Patients: BloggerCourse.com  Fact Sheet for Healthcare Providers: SeriousBroker.it  This test is not yet approved  or cleared by the [place]  FDA and has been authorized for detection and/or diagnosis of SARS-CoV-2 by FDA under an Emergency Use Authorization (EUA). This EUA will remain in effect (meaning this test can be used) for the duration of the COVID-19 declaration under Section 564(b)(1) of the Act, 21 U.S.C. section 360bbb-3(b)(1), unless the authorization is terminated  or revoked.     Resp Syncytial Virus by PCR NEGATIVE NEGATIVE Final    Comment: (NOTE) Fact Sheet for Patients: BloggerCourse.com  Fact Sheet for Healthcare Providers: SeriousBroker.it  This test is not yet approved or cleared by the [place]  FDA and has been authorized for detection and/or diagnosis of SARS-CoV-2 by FDA under an Emergency Use Authorization (EUA). This EUA will remain in effect (meaning this test can be used) for the duration of the COVID-19 declaration under Section 564(b)(1) of the Act, 21 U.S.C. section 360bbb-3(b)(1), unless the authorization is terminated or revoked.  Performed at [facility], [address] [place], [place] [address]   Blood Culture (routine x 2)     Status: None (Preliminary result)   Collection Time: 12/27/23  5:19 AM   Specimen: BLOOD  Result Value Ref Range Status   Specimen Description BLOOD LEFT ARM  Final   Special Requests   Final    BOTTLES DRAWN AEROBIC AND ANAEROBIC Blood Culture adequate volume   Culture   Final    NO GROWTH 1 DAY Performed at [facility], [address] [place], [place] [address]    Report Status PENDING  Incomplete  Blood Culture (routine x 2)     Status: None (Preliminary result)   Collection Time: 12/27/23  5:19 AM   Specimen: BLOOD  Result Value Ref Range Status   Specimen Description BLOOD LEFT ARM  Final   Special Requests   Final    BOTTLES DRAWN AEROBIC AND ANAEROBIC Blood Culture adequate volume   Culture   Final    NO GROWTH 1 DAY Performed at [facility], [address], [place], [place] [address]    Report Status PENDING  Incomplete    Labs: CBC: Recent Labs  Lab 12/27/23 0506 12/27/23 0507 12/28/23 0433  WBC 9.7 9.4 7.0  NEUTROABS 6.2  --   --   HGB 9.9* 9.7* 9.5*  HCT 31.7* 30.9* 30.4*  MCV 90.1 88.3 87.9  PLT 272 268 221   Basic Metabolic Panel: Recent Labs  Lab 12/27/23 0507  12/28/23 0433  NA 139 141  K 3.8 3.8  CL 100 99  CO2 30 29  GLUCOSE 88 129*  BUN 12 15  CREATININE 1.66* 1.08*  CALCIUM  8.7* 8.0*  MG 2.0  --    Liver Function Tests: Recent Labs  Lab 12/27/23 0506  AST 28  ALT 19  ALKPHOS 76  BILITOT 0.5  PROT 7.3  ALBUMIN  3.6   CBG: No results for input(s): "GLUCAP" in the last 168 hours.  Discharge time spent:  36 minutes.  Signed: Ezzard Holms, MD [facility] 12/28/2023

## 2023-12-28 NOTE — Progress Notes (Signed)
*  PRELIMINARY RESULTS* Echocardiogram 2D Echocardiogram has been performed.  Brittany Nguyen 12/28/2023, 8:00 AM

## 2023-12-28 NOTE — Progress Notes (Signed)
 [facility] CLINIC CARDIOLOGY PROGRESS NOTE       Patient ID: Brittany Nguyen MRN: [id number] DOB/AGE: 56-Jan-1967 56 y.o.  Admit date: 12/27/2023 Referring Physician Dr. Daisey Dryer Primary Physician Lyle San, MD  Primary Cardiologist Lucillie Saba [facility]) Reason for Consultation Recurrent hypotension  HPI: Brittany Nguyen is a 58 y.o. female  with a past medical history of hypertension, coronary artery disease, chronic systolic and diastolic heart failure, nonischemic cardiomyopathy s/p AICD (2018), paroxysmal atrial fibrillation, COPD, hypothyroidism, diabetes, history of DVT s/p IVC filter placement, seizures, hx of anemia who presented to the ED on 12/27/2023 for multiple concerns including headache, left arm and leg pain with associated numbness and weakness. Upon admission to the ED patient was found to be hypotension. Cardiology was consulted for further evaluation.   Interval history: -Patient seen and examined this AM, reports she is feeling ok overall.  -Denies any SOB, CP, palpitations. States that her knee hurts.  -BP improved, denies dizziness/lightheadedness.   Review of systems complete and found to be negative unless listed above    Past Medical History:  Diagnosis Date   Acute anterolateral wall MI (HCC) 08/2016   Acute respiratory failure (HCC)    AICD (automatic cardioverter/defibrillator) present 03/29/2017   a.) Medtronic Evera MRI XT DR SureScan (SN: [id number])   Anxiety    a.) on BZO PRN (lorazepam )   Arthritis    Asthma 2013   Breast cancer, right (HCC)    C. 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   a.) Bx 02/22/2018 --> NE tumor cells --> AE1/AE3, chromogranin, synaptophysin (+); Ki-67 proliferation rate <1%; b.) repeat Bx 08/24/2018 --> well differentiard NET (G1)   CHF (congestive heart failure) (HCC)    Chronic, continuous use of opioids    a.) has naloxone  Rx available   COPD (chronic  obstructive pulmonary disease) (HCC)    Coronary artery disease    Depression    Diet-controlled type 2 diabetes mellitus (HCC)    Diverticulitis 2010   DVT (deep venous thrombosis) (HCC)    Endometriosis 1990   Generalized epilepsy (HCC)    a.) on lacosamide ; b.) seizure 07-30-22 in setting of hypokalemia (K+ 2.6); c.) last seizure 11/13/2023 during blood draw   GERD (gastroesophageal reflux disease)    GIB (gastrointestinal bleeding) 01/08/2018   H/O syncope    HFrEF (heart failure with reduced ejection fraction) (HCC)    a.)TTE 12/09/16: EF 25-30%, dif HK; b.) TTE 02/08/17: EF 25%, dif HK, mild TR, G1DD; c.) TTE 04/25/17: EF 30-35%, mild LVH; d.) TTE 01/30/18: EF 45%, MAC, LAE, mild MR, G1DD; e.) TTE 11/06/2018: EF 25%, sev glob HK, triv MR/TR/PR; f.) TTE 05/12/19: EF 35%, MAC, AoV sclerosis, G1DD; g.) TTE 11/05/19: EF 20%, sev glob HK, triv MR; h.) TTE 06/17/20: EF 40%, mod glob HK, triv MR; I:) TTE 06/14/22: EF 25-30%, G1DD   History of cardiac catheterization    a.) LHC 10/21/2014: normal coronaries; b.) R/LHC 02/24/2017: normal coronaries, mRA 12, mPA 28, mPCWP 15, CO 8.0, CI 3.8   History of kidney stones    Hx MRSA infection    Hypertension    Hypokalemia    Hypothyroidism    a.) s/p thyroidectomy   Incomplete left bundle branch block (LBBB)    Iron  deficiency anemia 03/27/2018   Lump or mass in breast 11/27/2012   a.) Bx 11/27/2012 --> apocrine wall cyst   Migraine    Nausea & vomiting    Neuropathy  Non-ischemic cardiomyopathy (HCC)    a.) TTE 12/09/2016: EF 25-30%, b.) TTE 02/08/2017: EF 25%; c.) Medtronic AICD placed 03/29/2017; d.) TTE 04/25/2017: EF 30-35%; e.) TTE 01/30/2018: EF 45%; f.) TTE 11/06/2018: EF 25%; g.) TTE 05/12/2019: EF 35%; h.) TTE 11/05/2019: EF 20%; I.) TTE 06/17/2020: EF 40%; J.) TTE 06/14/2022: EF 25-30%   Obesity    OSA on CPAP    PAF (paroxysmal atrial fibrillation) (HCC)    Pneumonia    PONV (postoperative nausea and vomiting)    Post-COVID chronic  cough    Postcholecystectomy diarrhea    Prolonged Q-T interval on ECG    RAD (reactive airway disease)    Restless leg    a.) on ropinirole    Sepsis (HCC)    Unstable angina (HCC)     Past Surgical History:  Procedure Laterality Date   ABDOMINAL HYSTERECTOMY     age 85   BREAST BIOPSY Right 2014   benign   BREAST BIOPSY Right 01/18/2023   Us  Core Bx, coil clip - path pending   BREAST BIOPSY Right 01/18/2023   US  RT BREAST BX W LOC DEV 1ST LESION IMG BX SPEC US  GUIDE 01/18/2023 [facility]   CARDIAC DEFIBRILLATOR PLACEMENT Left 03/29/2017   Procedure: MEDTRONIC CARDIAC DEFIBRILLATOR PLACEMENT (AICD); Location: [facility]; Surgeon: Mira Amend, MD   CESAREAN SECTION     CHOLECYSTECTOMY     COLECTOMY     COLONOSCOPY WITH PROPOFOL  N/A 02/22/2018   Procedure: COLONOSCOPY WITH PROPOFOL ;  Surgeon: Toledo, Alphonsus Jeans, MD;  Location: [facility];  Service: Gastroenterology;  Laterality: N/A;   ESOPHAGOGASTRODUODENOSCOPY (EGD) WITH PROPOFOL  N/A 02/22/2018   Procedure: ESOPHAGOGASTRODUODENOSCOPY (EGD) WITH PROPOFOL ;  Surgeon: Toledo, Alphonsus Jeans, MD;  Location: [facility];  Service: Gastroenterology;  Laterality: N/A;   IVC FILTER INSERTION N/A 08/23/2022   Procedure: IVC FILTER INSERTION;  Surgeon: Jackquelyn Mass, MD;  Location: [facility]ASIVE [facility];  Service: Cardiovascular;  Laterality: N/A;   IVC FILTER REMOVAL N/A 03/07/2023   Procedure: IVC FILTER REMOVAL;  Surgeon: Jackquelyn Mass, MD;  Location: [facility];  Service: Cardiovascular;  Laterality: N/A;   KNEE ARTHROPLASTY Left 08/24/2022   Procedure: COMPUTER ASSISTED TOTAL KNEE ARTHROPLASTY;  Surgeon: Arlyne Lame, MD;  Location: [facility] OR[facility];  Service: Orthopedics;  Laterality: Left;   KNEE ARTHROPLASTY Right 12/01/2023   Procedure: COMPUTER ASSISTED TOTAL KNEE ARTHROPLASTY;  Surgeon: Arlyne Lame, MD;  Location: [facility];  Service: Orthopedics;  Laterality: Right;   LEFT HEART CATH AND CORONARY ANGIOGRAPHY Left  10/21/2014   Procedure: LEFT HEART CATH AND CORONARY ANGIOGRAPHY; Location: [facility]; Surgeon: Thomasene Flemings, MD   NASAL SINUS SURGERY  2012   OOPHORECTOMY     RIGHT/LEFT HEART CATH AND CORONARY ANGIOGRAPHY Bilateral 02/24/2017   Procedure: RIGHT/LEFT HEART CATH AND CORONARY ANGIOGRAPHY; Location: [facility]   THYROIDECTOMY      (Not in a hospital admission)  Social History   Socioeconomic History   Marital status: Married    Spouse name: Simcha, Farrington (Spouse) [phone] (Home Phone)   Number of children: Not on file   Years of education: Not on file   Highest education level: Not on file  Occupational History   Occupation: works at [employer].  Tobacco Use   Smoking status: Never   Smokeless tobacco: Never  Vaping Use   Vaping status: Never Used  Substance and Sexual Activity   Alcohol use: No   Drug use: Never   Sexual activity: Yes    Birth control/protection: Post-menopausal  Other  Topics Concern   Not on file  Social History Narrative   Not on file   Social Drivers of Health   Financial Resource Strain: High Risk (11/24/2023)   Received from [facility]   Overall Financial Resource Strain (CARDIA)    Difficulty of Paying Living Expenses: Hard  Food Insecurity: No Food Insecurity (12/06/2023)   Hunger Vital Sign    Worried About Running Out of Food in the Last Year: Never true    Ran Out of Food in the Last Year: Never true  Recent Concern: Food Insecurity - Food Insecurity Present (11/24/2023)   Received from [facility]   Hunger Vital Sign    Worried About Running Out of Food in the Last Year: Sometimes true    Ran Out of Food in the Last Year: Sometimes true  Transportation Needs: No Transportation Needs (12/06/2023)   PRAPARE - Administrator, Civil Service (Medical): No    Lack of Transportation (Non-Medical): No  Physical Activity: Insufficiently Active (12/07/2020)   Received from [facility], [facility]   Exercise Vital Sign    Days of Exercise per Week: 2 days    Minutes of Exercise per Session: 20 min  Stress: Stress Concern Present (12/07/2020)   Received from [facility], [facility]   [employer] of Occupational Health - Occupational Stress Questionnaire    Feeling of Stress : Rather much  Social Connections: Socially Integrated (12/01/2023)   Social Connection and Isolation Panel [NHANES]    Frequency of Communication with Friends and Family: More than three times a week    Frequency of Social Gatherings with Friends and Family: More than three times a week    Attends Religious Services: More than 4 times per year    Active Member of Clubs or Organizations: Yes    Attends Banker Meetings: 1 to 4 times per year    Marital Status: Married  Catering manager Violence: Not At Risk (12/06/2023)   Humiliation, Afraid, Rape, and Kick questionnaire    Fear of Current or Ex-Partner: No    Emotionally Abused: No    Physically Abused: No    Sexually Abused: No    Family History  Problem Relation Age of Onset   Breast cancer Mother    Cancer Mother 35       ovarian   CAD Mother    Cancer Father 38       brain   CAD Father    Cancer Daughter 29       skin   Breast cancer Maternal Aunt    Cancer Maternal Aunt 22       breast   Leukemia Paternal Grandfather    Breast cancer Cousin      Vitals:   12/28/23 0200 12/28/23 0300 12/28/23 0434 12/28/23 0530  BP: 127/77 111/65  105/69  Pulse: 80 81  75  Resp: 13 15  10   Temp:   98.9 F (37.2 C)   TempSrc:   Oral   SpO2: 95% 100%  93%  Weight:      Height:        PHYSICAL EXAM General: Well appearing female, well nourished, in no acute distress. HEENT: Normocephalic and atraumatic. Neck: No JVD.  Lungs: Normal respiratory effort on room air. Clear bilaterally to auscultation. No wheezes, crackles, rhonchi.  Heart: HRRR. Normal S1 and S2 without  gallops or murmurs.  Abdomen:  Non-distended appearing.  Msk: Normal strength and tone for age. Extremities: Warm and well perfused. No clubbing, cyanosis. No edema.  Neuro: Alert and oriented X 3. Psych: Answers questions appropriately.   Labs: Basic Metabolic Panel: Recent Labs    12/27/23 0507 12/28/23 0433  NA 139 141  K 3.8 3.8  CL 100 99  CO2 30 29  GLUCOSE 88 129*  BUN 12 15  CREATININE 1.66* 1.08*  CALCIUM  8.7* 8.0*  MG 2.0  --    Liver Function Tests: Recent Labs    12/27/23 0506  AST 28  ALT 19  ALKPHOS 76  BILITOT 0.5  PROT 7.3  ALBUMIN  3.6   No results for input(s): "LIPASE", "AMYLASE" in the last 72 hours. CBC: Recent Labs    12/27/23 0506 12/27/23 0507 12/28/23 0433  WBC 9.7 9.4 7.0  NEUTROABS 6.2  --   --   HGB 9.9* 9.7* 9.5*  HCT 31.7* 30.9* 30.4*  MCV 90.1 88.3 87.9  PLT 272 268 221   Cardiac Enzymes: Recent Labs    12/27/23 0507 12/27/23 0741  TROPONINIHS 5 3   BNP: Recent Labs    12/27/23 0507  BNP 18.2   D-Dimer: Recent Labs    12/27/23 0506  DDIMER 2.89*   Hemoglobin A1C: No results for input(s): "HGBA1C" in the last 72 hours. Fasting Lipid Panel: Recent Labs    12/28/23 0432  CHOL 257*  HDL 50  LDLCALC 164*  TRIG 217*  CHOLHDL 5.1   Thyroid  Function Tests: No results for input(s): "TSH", "T4TOTAL", "T3FREE", "THYROIDAB" in the last 72 hours.  Invalid input(s): "FREET3" Anemia Panel: No results for input(s): "VITAMINB12", "FOLATE", "FERRITIN", "TIBC", "IRON ", "RETICCTPCT" in the last 72 hours.   Radiology: NM Pulmonary Perfusion Result Date: 12/27/2023 CLINICAL DATA:  High probability for acute pulmonary embolism. Knee surgery 3 weeks ago. EXAM: NUCLEAR MEDICINE PERFUSION LUNG SCAN TECHNIQUE: Perfusion images were obtained in multiple projections after intravenous injection of radiopharmaceutical. Ventilation scans intentionally deferred if perfusion scan and chest x-ray adequate for interpretation during COVID  19 epidemic. RADIOPHARMACEUTICALS:  4.44 mCi Tc-45m MAA IV COMPARISON:  CT chest abdomen and pelvis 12/27/2023 FINDINGS: There is a uniform distribution of the radiopharmaceutical throughout both lungs. No segmental perfusion defects identified. IMPRESSION: Negative for acute pulmonary embolus. Electronically Signed   By: Kimberley Penman M.D.   On: 12/27/2023 11:59   US  Venous Img Lower Bilateral (DVT) Result Date: 12/27/2023 CLINICAL DATA:  Swelling lower extremity. Right knee replacement 3 4 weeks ago EXAM: BILATERAL LOWER EXTREMITY VENOUS DOPPLER ULTRASOUND TECHNIQUE: Gray-scale sonography with graded compression, as well as color Doppler and duplex ultrasound were performed to evaluate the lower extremity deep venous systems from the level of the common femoral vein and including the common femoral, femoral, profunda femoral, popliteal and calf veins including the posterior tibial, peroneal and gastrocnemius veins when visible. The superficial great saphenous vein was also interrogated. Spectral Doppler was utilized to evaluate flow at rest and with distal augmentation maneuvers in the common femoral, femoral and popliteal veins. COMPARISON:  Ultrasound 06/07/2023 FINDINGS: RIGHT LOWER EXTREMITY Common Femoral Vein: No evidence of thrombus. Normal compressibility, respiratory phasicity and response to augmentation. Saphenofemoral Junction: No evidence of thrombus. Normal compressibility and flow on color Doppler imaging. Profunda Femoral Vein: No evidence of thrombus. Normal compressibility and flow on color Doppler imaging. Femoral Vein: No evidence of thrombus. Normal compressibility, respiratory phasicity and response to augmentation. Popliteal Vein: No evidence of thrombus. Normal compressibility, respiratory phasicity and response to  augmentation. Calf Veins: No evidence of thrombus. Normal compressibility and flow on color Doppler imaging. Superficial Great Saphenous Vein: No evidence of thrombus. Normal  compressibility. Venous Reflux:  None. Other Findings:  None. LEFT LOWER EXTREMITY Common Femoral Vein: No evidence of thrombus. Normal compressibility, respiratory phasicity and response to augmentation. Saphenofemoral Junction: No evidence of thrombus. Normal compressibility and flow on color Doppler imaging. Profunda Femoral Vein: No evidence of thrombus. Normal compressibility and flow on color Doppler imaging. Femoral Vein: No evidence of thrombus. Normal compressibility, respiratory phasicity and response to augmentation. Popliteal Vein: No evidence of thrombus. Normal compressibility, respiratory phasicity and response to augmentation. Calf Veins: No evidence of thrombus. Normal compressibility and flow on color Doppler imaging. Superficial Great Saphenous Vein: No evidence of thrombus. Normal compressibility. Venous Reflux:  None. Other Findings:  None. IMPRESSION: No evidence of deep venous thrombosis in either lower extremity. Electronically Signed   By: Adrianna Horde M.D.   On: 12/27/2023 10:51   CT CHEST ABDOMEN PELVIS WO CONTRAST Result Date: 12/27/2023 CLINICAL DATA:  Septicemia. Chest pain radiating to left arm and neck. EXAM: CT CHEST, ABDOMEN AND PELVIS WITHOUT CONTRAST TECHNIQUE: Multidetector CT imaging of the chest, abdomen and pelvis was performed following the standard protocol without IV contrast. RADIATION DOSE REDUCTION: This exam was performed according to the departmental dose-optimization program which includes automated exposure control, adjustment of the mA and/or kV according to patient size and/or use of iterative reconstruction technique. COMPARISON:  12/06/2023 FINDINGS: CT CHEST FINDINGS Cardiovascular: Left chest wall ICD with leads in the right ventricle and cavoatrial junction. Heart size is mildly enlarged. No pericardial effusion. Mediastinum/Nodes: No enlarged mediastinal, hilar, or axillary lymph nodes. Thyroid  gland, trachea, and esophagus demonstrate no significant  findings. Lungs/Pleura: Central airways are patent. No pleural effusion, airspace consolidation, atelectasis or pneumothorax. Dependent changes are noted along the posterior lower lobes no atelectasis or pneumothorax. Stable tiny nodule within the posterior right lower lobe measuring 3 mm, image 104/4. Musculoskeletal: Subacute fracture deformities involving the right fourth, fifth, sixth, and seventh ribs are again noted and appear unchanged. No acute findings. CT ABDOMEN PELVIS FINDINGS Hepatobiliary: Mild steatosis within the liver. No focal liver abnormality. Status post cholecystectomy. No bile duct dilatation. Pancreas: Unremarkable. No pancreatic ductal dilatation or surrounding inflammatory changes. Spleen: Normal in size without focal abnormality. Adrenals/Urinary Tract: Normal adrenal glands. No nephrolithiasis, hydronephrosis or mass. Urinary bladder appears within normal limits. Stomach/Bowel: Stomach is normal. No pathologic dilatation of the large or small bowel loops. Sigmoid anastomosis identified within the pelvis. The appendix is not visualized and may be surgically absent. No secondary signs of acute appendicitis. Vascular/Lymphatic: Normal appearance of the abdominal aorta. IVC filter in place. No signs of abdominopelvic adenopathy. Reproductive: Status post hysterectomy. No adnexal masses. Other: No free fluid or fluid collections. No signs of pneumoperitoneum. Musculoskeletal: No acute or significant osseous findings. IMPRESSION: 1. No acute findings within the chest, abdomen or pelvis. 2. Subacute fracture deformities involving the right fourth, fifth, sixth, and seventh ribs are again noted and appear unchanged. 3. Mild hepatic steatosis. 4. Stable tiny nodule within the posterior right lower lobe measuring 3 mm. If the patient is at high risk for bronchogenic carcinoma, follow-up chest CT at 1 year is recommended. If the patient is at low risk, no follow-up is needed. This recommendation  follows the consensus statement: Guidelines for Management of Small Pulmonary Nodules Detected on CT Scans: A Statement from the Fleischner Society as published in Radiology 2005; 237:395-400. Electronically Signed  By: Kimberley Penman M.D.   On: 12/27/2023 08:31   CT HEAD WO CONTRAST Result Date: 12/27/2023 CLINICAL DATA:  Neuro deficit with stroke suspected.  Hypotension EXAM: CT HEAD WITHOUT CONTRAST TECHNIQUE: Contiguous axial images were obtained from the base of the skull through the vertex without intravenous contrast. RADIATION DOSE REDUCTION: This exam was performed according to the departmental dose-optimization program which includes automated exposure control, adjustment of the mA and/or kV according to patient size and/or use of iterative reconstruction technique. COMPARISON:  12/06/2023 FINDINGS: Brain: No evidence of acute infarction, hemorrhage, hydrocephalus, extra-axial collection or mass lesion/mass effect. Vascular: No hyperdense vessel or unexpected calcification. Skull: Normal. Negative for fracture or focal lesion. Sinuses/Orbits: Prior endoscopic sinus surgery. A small left sphenoid sinus fluid level is again seen, with patent ostium. IMPRESSION: Stable, normal appearance of the brain. Electronically Signed   By: Ronnette Coke M.D.   On: 12/27/2023 06:26   DG Chest Port 1 View Result Date: 12/27/2023 CLINICAL DATA:  Chest pain EXAM: PORTABLE CHEST 1 VIEW COMPARISON:  12/06/2023 FINDINGS: Dual-chamber ICD/pacer leads in stable position. Chronic cardiopericardial enlargement and low lung volumes. There is no edema, consolidation, effusion, or pneumothorax. IMPRESSION: Stable exam.  No evidence of acute disease. Electronically Signed   By: Ronnette Coke M.D.   On: 12/27/2023 05:30   CT CHEST WO CONTRAST Result Date: 12/06/2023 CLINICAL DATA:  Hypotension of unclear source. Recent right knee surgery. EXAM: CT CHEST, ABDOMEN AND PELVIS WITHOUT CONTRAST TECHNIQUE: Multidetector CT  imaging of the chest, abdomen and pelvis was performed following the standard protocol without IV contrast. RADIATION DOSE REDUCTION: This exam was performed according to the departmental dose-optimization program which includes automated exposure control, adjustment of the mA and/or kV according to patient size and/or use of iterative reconstruction technique. COMPARISON:  Chest CT 03/08/2023.  CT abdomen and pelvis 12/15/2021. FINDINGS: CT CHEST FINDINGS Cardiovascular: The heart is mildly enlarged. There is no pericardial effusion. Left-sided pacemaker is present. Aorta is normal in size. Mediastinum/Nodes: No enlarged mediastinal or axillary lymph nodes. Trachea and esophagus are within normal limits. There surgical clips in the region of the thyroid . Lungs/Pleura: There is a 3 mm nodule in the right lung base image 4/105 which is unchanged from 2023. The lungs are otherwise clear. No pleural effusion or pneumothorax. Musculoskeletal: There subacute appearing right anterolateral fourth through seventh rib fractures. CT ABDOMEN PELVIS FINDINGS Hepatobiliary: No focal liver abnormality is seen. Status post cholecystectomy. No biliary dilatation. Pancreas: Unremarkable. No pancreatic ductal dilatation or surrounding inflammatory changes. Spleen: Normal in size without focal abnormality. Adrenals/Urinary Tract: Adrenal glands are unremarkable. Kidneys are normal, without renal calculi, focal lesion, or hydronephrosis. Bladder is unremarkable. Stomach/Bowel: Sigmoid colon anastomosis present. Scattered colonic diverticula present. Vascular/Lymphatic: IVC filter in place inferior to the level of the renal veins. Aorta and IVC are normal in size. No enlarged lymph nodes are identified. Reproductive: Status post hysterectomy. No adnexal masses. Other: No abdominal wall hernia or abnormality. No abdominopelvic ascites. Musculoskeletal: No acute or significant osseous findings. IMPRESSION: 1. Subacute appearing right  anterolateral fourth through seventh rib fractures. 2. No acute localizing process in the abdomen or pelvis. 3. Colonic diverticulosis. 4. IVC filter in place. 5. Stable 3 mm nodule in the right lung base. No follow-up needed if patient is low-risk.This recommendation follows the consensus statement: Guidelines for Management of Incidental Pulmonary Nodules Detected on CT Images: From the Fleischner Society 2017; Radiology 2017; 284:228-243. Electronically Signed   By: Rollen Clines.D.  On: 12/06/2023 16:03   CT ABDOMEN PELVIS WO CONTRAST Result Date: 12/06/2023 CLINICAL DATA:  Hypotension of unclear source. Recent right knee surgery. EXAM: CT CHEST, ABDOMEN AND PELVIS WITHOUT CONTRAST TECHNIQUE: Multidetector CT imaging of the chest, abdomen and pelvis was performed following the standard protocol without IV contrast. RADIATION DOSE REDUCTION: This exam was performed according to the departmental dose-optimization program which includes automated exposure control, adjustment of the mA and/or kV according to patient size and/or use of iterative reconstruction technique. COMPARISON:  Chest CT 03/08/2023.  CT abdomen and pelvis 12/15/2021. FINDINGS: CT CHEST FINDINGS Cardiovascular: The heart is mildly enlarged. There is no pericardial effusion. Left-sided pacemaker is present. Aorta is normal in size. Mediastinum/Nodes: No enlarged mediastinal or axillary lymph nodes. Trachea and esophagus are within normal limits. There surgical clips in the region of the thyroid . Lungs/Pleura: There is a 3 mm nodule in the right lung base image 4/105 which is unchanged from 2023. The lungs are otherwise clear. No pleural effusion or pneumothorax. Musculoskeletal: There subacute appearing right anterolateral fourth through seventh rib fractures. CT ABDOMEN PELVIS FINDINGS Hepatobiliary: No focal liver abnormality is seen. Status post cholecystectomy. No biliary dilatation. Pancreas: Unremarkable. No pancreatic ductal dilatation  or surrounding inflammatory changes. Spleen: Normal in size without focal abnormality. Adrenals/Urinary Tract: Adrenal glands are unremarkable. Kidneys are normal, without renal calculi, focal lesion, or hydronephrosis. Bladder is unremarkable. Stomach/Bowel: Sigmoid colon anastomosis present. Scattered colonic diverticula present. Vascular/Lymphatic: IVC filter in place inferior to the level of the renal veins. Aorta and IVC are normal in size. No enlarged lymph nodes are identified. Reproductive: Status post hysterectomy. No adnexal masses. Other: No abdominal wall hernia or abnormality. No abdominopelvic ascites. Musculoskeletal: No acute or significant osseous findings. IMPRESSION: 1. Subacute appearing right anterolateral fourth through seventh rib fractures. 2. No acute localizing process in the abdomen or pelvis. 3. Colonic diverticulosis. 4. IVC filter in place. 5. Stable 3 mm nodule in the right lung base. No follow-up needed if patient is low-risk.This recommendation follows the consensus statement: Guidelines for Management of Incidental Pulmonary Nodules Detected on CT Images: From the Fleischner Society 2017; Radiology 2017; 284:228-243. Electronically Signed   By: Tyron Gallon M.D.   On: 12/06/2023 16:03   CT HEAD WO CONTRAST ( ) Result Date: 12/06/2023 CLINICAL DATA:  Altered mental status. EXAM: CT HEAD WITHOUT CONTRAST TECHNIQUE: Contiguous axial images were obtained from the base of the skull through the vertex without intravenous contrast. RADIATION DOSE REDUCTION: This exam was performed according to the departmental dose-optimization program which includes automated exposure control, adjustment of the mA and/or kV according to patient size and/or use of iterative reconstruction technique. COMPARISON:  Head CT 10/06/2023.  MRI brain 09/25/2023. FINDINGS: Brain: No evidence of acute infarction, hemorrhage, hydrocephalus, extra-axial collection or mass lesion/mass effect. Vascular: No  hyperdense vessel or unexpected calcification. Skull: Normal. Negative for fracture or focal lesion. Sinuses/Orbits: There is a small air-fluid level in the left sphenoid sinus. Patient is status post sinonasal surgery bilaterally. Orbits are within normal limits. Other: None. IMPRESSION: 1. No acute intracranial process. 2. Small air-fluid level in the left sphenoid sinus. Correlate for acute sinusitis. Electronically Signed   By: Tyron Gallon M.D.   On: 12/06/2023 15:49   DG Chest Port 1 View Result Date: 12/06/2023 CLINICAL DATA:  Concern for sepsis. EXAM: PORTABLE CHEST 1 VIEW COMPARISON:  Chest radiograph dated 12/20/2023. FINDINGS: Low lung volumes with associated accentuation of the cardiac silhouette. Stable left subclavian dual lead cardiac device in place.  No focal consolidation, sizeable pleural effusion, or pneumothorax. No acute osseous abnormality. IMPRESSION: Low lung volumes.  No acute cardiopulmonary findings. Electronically Signed   By: Mannie Seek M.D.   On: 12/06/2023 14:13   DG Knee Right Port Result Date: 12/01/2023 CLINICAL DATA:  Status post right knee arthroplasty. EXAM: PORTABLE RIGHT KNEE - 1-2 VIEW COMPARISON:  Right knee radiographs 05/13/2018 FINDINGS: Interval total right knee arthroplasty. No perihardware lucency is seen to indicate hardware failure or loosening. Expected postoperative changes including intra-articular and subcutaneous air. Smalljoint effusion. Anterior surgical skin staples. There is a superior approach surgical drain overlying the anterior femorotibial joint space. Old screw tracts are seen within the distal femoral diaphysis and proximal tibial diaphysis. No acute fracture or dislocation. IMPRESSION: Interval total right knee arthroplasty without evidence of hardware failure. Electronically Signed   By: Bertina Broccoli M.D.   On: 12/01/2023 13:09    ECHO pending  TELEMETRY reviewed by me 12/28/2023: sinus rhythm, rate 90s  EKG reviewed by me: sinus  rhythm, 1st degree AVB, prolonged QTc 507 ms, rate 70 bpm  Data reviewed by me 12/28/2023: last 24h vitals tele labs imaging I/O hospitalist progress note  Principal Problem:   Hypotensive episode Active Problems:   GERD   Chest pain   Hypotension   COPD (chronic obstructive pulmonary disease) (HCC)   Type 2 diabetes mellitus without complication (HCC)   History of DVT (deep vein thrombosis)   Mild intermittent asthma without complication   OSA on CPAP   Hypothyroidism   CAD (coronary artery disease)   Encephalopathy   Recurrent seizures (HCC)   HFrEF (heart failure with reduced ejection fraction) (HCC)    ASSESSMENT AND PLAN:  Brittany Nguyen is a 58 y.o. female  with a past medical history of hypertension, coronary artery disease, chronic systolic and diastolic heart failure, nonischemic cardiomyopathy s/p AICD (2018), paroxysmal atrial fibrillation, COPD, hypothyroidism, diabetes, history of DVT s/p IVC filter placement, seizures, hx of anemia who presented to the ED on 12/27/2023 for multiple concerns including headache, left arm and leg pain with associated numbness and weakness, and left-sided chest pain.  Upon admission to the ED patient was found with recurrent hypotension. Cardiology was consulted for further evaluation.   # Asymptomatic Hypotension # Nonischemic cardiomyopathy s/p AICD (2018) # Chronic systolic and diastolic heart failure Patient reported to the ED with concerns for headache, left arm and neck pain. Patient denies chest pain or SOB. Upon arrival to ED patient found to be hypotensive without lightheadedness, dizziness, or syncope. Patient recently had TKR procedure on 04/14. LE US  shows no evidence of DVT. V/Q scan pending.  Lactic acid elevated and trending 2.1 > 2.8. D-dimer elevated at 2.89.  EKG in the ED with sinus rhythm, first-degree AV block, prolonged QTc 507 ms, rate 70 bpm.  Troponin is negative x 2.  BNP normal at 18. Hgb today 9.7 and stable.  Reported remote hx of paroxymal AF per chart review, no EKGs available with evidence of AF, patient has remained in sinus rhythm per ED EKG and tele. BP has improved s/p IVF. -Echo pending. -Of note, patient recently admitted to ED on 4/2-4/4 for hypotension and improved with IV fluids. Continue IVF as appears euvolemic on exam and not in heart failure exacerbation.  -Lovenox  injections for DVT PPX. Was taking asa 81 mg bid at home due to recent TKA which ortho recommends be continued for 6 weeks post-op. Recommend resuming at this dose on discharge. Will given asa  81 mg daily for now. -Continue home metoprolol  succinate 50 mg daily.  -Resume home losartan  at reduced dose of 12.5 mg once daily.   -Defer resuming home torsemide  and spironolactone . Appears euvolemic on exam. Can consider resuming outpatient if BP stable.   Cardiology will sign off. Please haiku with questions or re-engage if needed.     This patient's plan of care was discussed and created with Dr. Beau Bound and he is in agreement.  Signed: Hamp Levine, PA-C  12/28/2023, 8:28 AM [facility] Cardiology

## 2024-01-01 LAB — CULTURE, BLOOD (ROUTINE X 2)
Culture: NO GROWTH
Culture: NO GROWTH
Special Requests: ADEQUATE
Special Requests: ADEQUATE

## 2024-01-04 DIAGNOSIS — Z0100 Encounter for examination of eyes and vision without abnormal findings: Secondary | ICD-10-CM | POA: Diagnosis not present

## 2024-01-10 DIAGNOSIS — I428 Other cardiomyopathies: Secondary | ICD-10-CM | POA: Diagnosis not present

## 2024-01-10 DIAGNOSIS — Z79899 Other long term (current) drug therapy: Secondary | ICD-10-CM | POA: Diagnosis not present

## 2024-01-10 DIAGNOSIS — I5022 Chronic systolic (congestive) heart failure: Secondary | ICD-10-CM | POA: Diagnosis not present

## 2024-01-10 DIAGNOSIS — I5023 Acute on chronic systolic (congestive) heart failure: Secondary | ICD-10-CM | POA: Diagnosis not present

## 2024-01-10 DIAGNOSIS — I110 Hypertensive heart disease with heart failure: Secondary | ICD-10-CM | POA: Diagnosis not present

## 2024-01-11 DIAGNOSIS — Z95810 Presence of automatic (implantable) cardiac defibrillator: Secondary | ICD-10-CM | POA: Diagnosis not present

## 2024-01-11 DIAGNOSIS — Z4502 Encounter for adjustment and management of automatic implantable cardiac defibrillator: Secondary | ICD-10-CM | POA: Diagnosis not present

## 2024-01-11 DIAGNOSIS — I5022 Chronic systolic (congestive) heart failure: Secondary | ICD-10-CM | POA: Diagnosis not present

## 2024-01-15 DIAGNOSIS — Z4502 Encounter for adjustment and management of automatic implantable cardiac defibrillator: Secondary | ICD-10-CM | POA: Diagnosis not present

## 2024-01-15 DIAGNOSIS — I5042 Chronic combined systolic (congestive) and diastolic (congestive) heart failure: Secondary | ICD-10-CM | POA: Diagnosis not present

## 2024-01-16 DIAGNOSIS — Z96651 Presence of right artificial knee joint: Secondary | ICD-10-CM | POA: Diagnosis not present

## 2024-01-23 ENCOUNTER — Encounter (INDEPENDENT_AMBULATORY_CARE_PROVIDER_SITE_OTHER): Payer: Self-pay

## 2024-02-05 DIAGNOSIS — E890 Postprocedural hypothyroidism: Secondary | ICD-10-CM | POA: Diagnosis not present

## 2024-02-05 DIAGNOSIS — K580 Irritable bowel syndrome with diarrhea: Secondary | ICD-10-CM | POA: Diagnosis not present

## 2024-02-05 DIAGNOSIS — D3A8 Other benign neuroendocrine tumors: Secondary | ICD-10-CM | POA: Diagnosis not present

## 2024-02-05 DIAGNOSIS — D509 Iron deficiency anemia, unspecified: Secondary | ICD-10-CM | POA: Diagnosis not present

## 2024-02-05 DIAGNOSIS — C7A092 Malignant carcinoid tumor of the stomach: Secondary | ICD-10-CM | POA: Diagnosis not present

## 2024-02-05 DIAGNOSIS — E559 Vitamin D deficiency, unspecified: Secondary | ICD-10-CM | POA: Diagnosis not present

## 2024-02-05 DIAGNOSIS — G893 Neoplasm related pain (acute) (chronic): Secondary | ICD-10-CM | POA: Diagnosis not present

## 2024-02-05 DIAGNOSIS — I428 Other cardiomyopathies: Secondary | ICD-10-CM | POA: Diagnosis not present

## 2024-02-05 DIAGNOSIS — R569 Unspecified convulsions: Secondary | ICD-10-CM | POA: Diagnosis not present

## 2024-02-05 DIAGNOSIS — C7A8 Other malignant neuroendocrine tumors: Secondary | ICD-10-CM | POA: Diagnosis not present

## 2024-02-05 DIAGNOSIS — R109 Unspecified abdominal pain: Secondary | ICD-10-CM | POA: Diagnosis not present

## 2024-02-05 DIAGNOSIS — R0602 Shortness of breath: Secondary | ICD-10-CM | POA: Diagnosis not present

## 2024-02-09 DIAGNOSIS — J019 Acute sinusitis, unspecified: Secondary | ICD-10-CM | POA: Diagnosis not present

## 2024-02-09 DIAGNOSIS — J029 Acute pharyngitis, unspecified: Secondary | ICD-10-CM | POA: Diagnosis not present

## 2024-02-09 DIAGNOSIS — Z03818 Encounter for observation for suspected exposure to other biological agents ruled out: Secondary | ICD-10-CM | POA: Diagnosis not present

## 2024-02-09 DIAGNOSIS — N1831 Chronic kidney disease, stage 3a: Secondary | ICD-10-CM | POA: Diagnosis not present

## 2024-02-09 DIAGNOSIS — B9689 Other specified bacterial agents as the cause of diseases classified elsewhere: Secondary | ICD-10-CM | POA: Diagnosis not present

## 2024-02-15 DIAGNOSIS — Z4502 Encounter for adjustment and management of automatic implantable cardiac defibrillator: Secondary | ICD-10-CM | POA: Diagnosis not present

## 2024-02-15 DIAGNOSIS — I5042 Chronic combined systolic (congestive) and diastolic (congestive) heart failure: Secondary | ICD-10-CM | POA: Diagnosis not present

## 2024-02-15 DIAGNOSIS — Z515 Encounter for palliative care: Secondary | ICD-10-CM | POA: Diagnosis not present

## 2024-02-15 DIAGNOSIS — C50919 Malignant neoplasm of unspecified site of unspecified female breast: Secondary | ICD-10-CM | POA: Diagnosis not present

## 2024-02-15 DIAGNOSIS — G893 Neoplasm related pain (acute) (chronic): Secondary | ICD-10-CM | POA: Diagnosis not present

## 2024-02-15 DIAGNOSIS — E039 Hypothyroidism, unspecified: Secondary | ICD-10-CM | POA: Diagnosis not present

## 2024-03-03 ENCOUNTER — Observation Stay
Admission: EM | Admit: 2024-03-03 | Discharge: 2024-03-05 | Disposition: A | Discharge status: Home / Self Care | Attending: Emergency Medicine | Admitting: Emergency Medicine

## 2024-03-03 ENCOUNTER — Emergency Department

## 2024-03-03 DIAGNOSIS — D649 Anemia, unspecified: Secondary | ICD-10-CM | POA: Diagnosis not present

## 2024-03-03 DIAGNOSIS — E039 Hypothyroidism, unspecified: Secondary | ICD-10-CM | POA: Diagnosis not present

## 2024-03-03 DIAGNOSIS — G4733 Obstructive sleep apnea (adult) (pediatric): Secondary | ICD-10-CM | POA: Insufficient documentation

## 2024-03-03 DIAGNOSIS — I110 Hypertensive heart disease with heart failure: Secondary | ICD-10-CM | POA: Diagnosis not present

## 2024-03-03 DIAGNOSIS — E669 Obesity, unspecified: Secondary | ICD-10-CM | POA: Diagnosis not present

## 2024-03-03 DIAGNOSIS — I4581 Long QT syndrome: Secondary | ICD-10-CM | POA: Insufficient documentation

## 2024-03-03 DIAGNOSIS — J441 Chronic obstructive pulmonary disease with (acute) exacerbation: Principal | ICD-10-CM | POA: Diagnosis present

## 2024-03-03 DIAGNOSIS — E876 Hypokalemia: Secondary | ICD-10-CM | POA: Diagnosis present

## 2024-03-03 DIAGNOSIS — F418 Other specified anxiety disorders: Secondary | ICD-10-CM | POA: Diagnosis present

## 2024-03-03 DIAGNOSIS — I5042 Chronic combined systolic (congestive) and diastolic (congestive) heart failure: Secondary | ICD-10-CM | POA: Diagnosis present

## 2024-03-03 DIAGNOSIS — Z4502 Encounter for adjustment and management of automatic implantable cardiac defibrillator: Secondary | ICD-10-CM | POA: Diagnosis not present

## 2024-03-03 DIAGNOSIS — R059 Cough, unspecified: Secondary | ICD-10-CM | POA: Diagnosis not present

## 2024-03-03 DIAGNOSIS — R0789 Other chest pain: Secondary | ICD-10-CM | POA: Diagnosis not present

## 2024-03-03 DIAGNOSIS — E119 Type 2 diabetes mellitus without complications: Secondary | ICD-10-CM

## 2024-03-03 DIAGNOSIS — Z95810 Presence of automatic (implantable) cardiac defibrillator: Secondary | ICD-10-CM | POA: Insufficient documentation

## 2024-03-03 DIAGNOSIS — Z6833 Body mass index (BMI) 33.0-33.9, adult: Secondary | ICD-10-CM | POA: Diagnosis not present

## 2024-03-03 DIAGNOSIS — Z8679 Personal history of other diseases of the circulatory system: Secondary | ICD-10-CM | POA: Diagnosis present

## 2024-03-03 DIAGNOSIS — Z91040 Latex allergy status: Secondary | ICD-10-CM | POA: Diagnosis not present

## 2024-03-03 DIAGNOSIS — R9431 Abnormal electrocardiogram [ECG] [EKG]: Secondary | ICD-10-CM | POA: Diagnosis present

## 2024-03-03 DIAGNOSIS — R079 Chest pain, unspecified: Principal | ICD-10-CM | POA: Diagnosis present

## 2024-03-03 DIAGNOSIS — T82897A Other specified complication of cardiac prosthetic devices, implants and grafts, initial encounter: Secondary | ICD-10-CM | POA: Insufficient documentation

## 2024-03-03 DIAGNOSIS — I2510 Atherosclerotic heart disease of native coronary artery without angina pectoris: Secondary | ICD-10-CM | POA: Diagnosis not present

## 2024-03-03 DIAGNOSIS — Z7982 Long term (current) use of aspirin: Secondary | ICD-10-CM | POA: Insufficient documentation

## 2024-03-03 DIAGNOSIS — G40909 Epilepsy, unspecified, not intractable, without status epilepticus: Secondary | ICD-10-CM | POA: Insufficient documentation

## 2024-03-03 DIAGNOSIS — C7A092 Malignant carcinoid tumor of the stomach: Secondary | ICD-10-CM | POA: Diagnosis not present

## 2024-03-03 DIAGNOSIS — I5022 Chronic systolic (congestive) heart failure: Secondary | ICD-10-CM | POA: Diagnosis present

## 2024-03-03 DIAGNOSIS — R0602 Shortness of breath: Secondary | ICD-10-CM | POA: Diagnosis present

## 2024-03-03 DIAGNOSIS — I10 Essential (primary) hypertension: Secondary | ICD-10-CM | POA: Diagnosis present

## 2024-03-03 LAB — CBC WITH DIFFERENTIAL/PLATELET
Abs Immature Granulocytes: 0.02 10*3/uL (ref 0.00–0.07)
Basophils Absolute: 0 10*3/uL (ref 0.0–0.1)
Basophils Relative: 1 %
Eosinophils Absolute: 0.2 10*3/uL (ref 0.0–0.5)
Eosinophils Relative: 3 %
HCT: 34.9 % — ABNORMAL LOW (ref 36.0–46.0)
Hemoglobin: 10.7 g/dL — ABNORMAL LOW (ref 12.0–15.0)
Immature Granulocytes: 0 %
Lymphocytes Relative: 35 %
Lymphs Abs: 2.3 10*3/uL (ref 0.7–4.0)
MCH: 25.4 pg — ABNORMAL LOW (ref 26.0–34.0)
MCHC: 30.7 g/dL (ref 30.0–36.0)
MCV: 82.9 fL (ref 80.0–100.0)
Monocytes Absolute: 0.3 10*3/uL (ref 0.1–1.0)
Monocytes Relative: 5 %
Neutro Abs: 3.7 10*3/uL (ref 1.7–7.7)
Neutrophils Relative %: 56 %
Platelets: 242 10*3/uL (ref 150–400)
RBC: 4.21 MIL/uL (ref 3.87–5.11)
RDW: 15.8 % — ABNORMAL HIGH (ref 11.5–15.5)
WBC: 6.5 10*3/uL (ref 4.0–10.5)
nRBC: 0 % (ref 0.0–0.2)

## 2024-03-03 LAB — MAGNESIUM: Magnesium: 2.1 mg/dL (ref 1.7–2.4)

## 2024-03-03 LAB — BASIC METABOLIC PANEL WITH GFR
Anion gap: 11 (ref 5–15)
BUN: 9 mg/dL (ref 6–20)
CO2: 29 mmol/L (ref 22–32)
Calcium: 8.5 mg/dL — ABNORMAL LOW (ref 8.9–10.3)
Chloride: 98 mmol/L (ref 98–111)
Creatinine, Ser: 0.88 mg/dL (ref 0.44–1.00)
GFR, Estimated: >60 mL/min (ref >60)
Glucose, Bld: 172 mg/dL — ABNORMAL HIGH (ref 70–99)
Potassium: 3.2 mmol/L — ABNORMAL LOW (ref 3.5–5.1)
Sodium: 138 mmol/L (ref 135–145)

## 2024-03-03 LAB — TROPONIN I (HIGH SENSITIVITY)
Troponin I (High Sensitivity): 3 ng/L (ref <18)
Troponin I (High Sensitivity): <2 ng/L (ref <18)

## 2024-03-03 LAB — BRAIN NATRIURETIC PEPTIDE: B Natriuretic Peptide: 34.2 pg/mL (ref 0.0–100.0)

## 2024-03-03 LAB — RESP PANEL BY RT-PCR (RSV, FLU A&B, COVID)  RVPGX2
Influenza A by PCR: NEGATIVE
Influenza B by PCR: NEGATIVE
Resp Syncytial Virus by PCR: NEGATIVE
SARS Coronavirus 2 by RT PCR: NEGATIVE

## 2024-03-03 LAB — PHOSPHORUS: Phosphorus: 2.9 mg/dL (ref 2.5–4.6)

## 2024-03-03 LAB — D-DIMER, QUANTITATIVE: D-Dimer, Quant: 2.46 ug{FEU}/mL — ABNORMAL HIGH (ref 0.00–0.50)

## 2024-03-03 MED ORDER — ENOXAPARIN SODIUM 60 MG/0.6ML IJ SOSY
0.5000 mg/kg | PREFILLED_SYRINGE | INTRAMUSCULAR | Status: DC
  Administered 2024-03-03: 45 mg via SUBCUTANEOUS
  Filled 2024-03-03: qty 0.6

## 2024-03-03 MED ORDER — KETOROLAC TROMETHAMINE 15 MG/ML IJ SOLN
15.0000 mg | Freq: Once | INTRAMUSCULAR | Status: AC
  Administered 2024-03-03: 15 mg via INTRAVENOUS
  Filled 2024-03-03: qty 1

## 2024-03-03 MED ORDER — ACETAMINOPHEN 325 MG PO TABS
325.0000 mg | ORAL_TABLET | ORAL | Status: DC | PRN
  Administered 2024-03-04 – 2024-03-05 (×3): 325 mg via ORAL
  Filled 2024-03-03 (×3): qty 1

## 2024-03-03 MED ORDER — GABAPENTIN 300 MG PO CAPS
300.0000 mg | ORAL_CAPSULE | Freq: Two times a day (BID) | ORAL | Status: DC
  Administered 2024-03-03 – 2024-03-05 (×4): 300 mg via ORAL
  Filled 2024-03-03 (×4): qty 1

## 2024-03-03 MED ORDER — LACOSAMIDE 50 MG PO TABS
200.0000 mg | ORAL_TABLET | Freq: Two times a day (BID) | ORAL | Status: DC
  Administered 2024-03-03 – 2024-03-05 (×4): 200 mg via ORAL
  Filled 2024-03-03 (×4): qty 4

## 2024-03-03 MED ORDER — DIPHENHYDRAMINE HCL 50 MG/ML IJ SOLN: 12.5000 mg | Freq: Three times a day (TID) | INTRAMUSCULAR | Status: DC | PRN

## 2024-03-03 MED ORDER — ENOXAPARIN SODIUM 40 MG/0.4ML IJ SOSY: 40.0000 mg | PREFILLED_SYRINGE | INTRAMUSCULAR | Status: DC

## 2024-03-03 MED ORDER — ACETAMINOPHEN 325 MG PO TABS
650.0000 mg | ORAL_TABLET | Freq: Four times a day (QID) | ORAL | Status: DC | PRN
  Administered 2024-03-03 – 2024-03-04 (×2): 650 mg via ORAL
  Filled 2024-03-03 (×2): qty 2

## 2024-03-03 MED ORDER — METHYLPREDNISOLONE SODIUM SUCC 125 MG IJ SOLR
80.0000 mg | Freq: Every day | INTRAMUSCULAR | Status: DC
  Administered 2024-03-04 – 2024-03-05 (×2): 80 mg via INTRAVENOUS
  Filled 2024-03-03 (×2): qty 2

## 2024-03-03 MED ORDER — BENZONATATE 100 MG PO CAPS
200.0000 mg | ORAL_CAPSULE | Freq: Once | ORAL | Status: AC
  Administered 2024-03-03: 200 mg via ORAL
  Filled 2024-03-03: qty 2

## 2024-03-03 MED ORDER — OXYCODONE HCL 5 MG PO TABS
10.0000 mg | ORAL_TABLET | ORAL | Status: DC | PRN
  Administered 2024-03-03 – 2024-03-05 (×6): 10 mg via ORAL
  Filled 2024-03-03 (×6): qty 2

## 2024-03-03 MED ORDER — METOPROLOL SUCCINATE ER 50 MG PO TB24
50.0000 mg | ORAL_TABLET | Freq: Two times a day (BID) | ORAL | Status: DC
  Administered 2024-03-03 – 2024-03-05 (×4): 50 mg via ORAL
  Filled 2024-03-03 (×4): qty 1

## 2024-03-03 MED ORDER — LOSARTAN POTASSIUM 25 MG PO TABS
12.5000 mg | ORAL_TABLET | Freq: Two times a day (BID) | ORAL | Status: DC
  Administered 2024-03-03 – 2024-03-05 (×4): 12.5 mg via ORAL
  Filled 2024-03-03 (×4): qty 1

## 2024-03-03 MED ORDER — DM-GUAIFENESIN ER 30-600 MG PO TB12
1.0000 | ORAL_TABLET | Freq: Two times a day (BID) | ORAL | Status: DC | PRN
  Administered 2024-03-03 – 2024-03-04 (×2): 1 via ORAL
  Filled 2024-03-03 (×2): qty 1

## 2024-03-03 MED ORDER — LEVOTHYROXINE SODIUM 100 MCG PO TABS
200.0000 ug | ORAL_TABLET | Freq: Every day | ORAL | Status: DC
  Administered 2024-03-04 – 2024-03-05 (×2): 200 ug via ORAL
  Filled 2024-03-03 (×2): qty 2

## 2024-03-03 MED ORDER — METHADONE HCL 10 MG PO TABS
10.0000 mg | ORAL_TABLET | Freq: Two times a day (BID) | ORAL | Status: DC
  Administered 2024-03-04: 10 mg via ORAL
  Filled 2024-03-03: qty 1

## 2024-03-03 MED ORDER — INSULIN ASPART 100 UNIT/ML IV SOLN
10.0000 [IU] | Freq: Once | INTRAVENOUS | Status: DC
  Filled 2024-03-03: qty 0.1

## 2024-03-03 MED ORDER — METHADONE HCL 10 MG PO TABS
15.0000 mg | ORAL_TABLET | Freq: Every day | ORAL | Status: DC
  Administered 2024-03-03: 15 mg via ORAL
  Filled 2024-03-03: qty 2

## 2024-03-03 MED ORDER — IPRATROPIUM-ALBUTEROL 0.5-2.5 (3) MG/3ML IN SOLN
3.0000 mL | RESPIRATORY_TRACT | Status: DC
  Administered 2024-03-03 – 2024-03-04 (×2): 3 mL via RESPIRATORY_TRACT
  Filled 2024-03-03 (×2): qty 3

## 2024-03-03 MED ORDER — METHADONE HCL 10 MG PO TABS: 5.0000 mg | ORAL_TABLET | ORAL | Status: DC

## 2024-03-03 MED ORDER — TIZANIDINE HCL 2 MG PO TABS: 4.0000 mg | ORAL_TABLET | Freq: Three times a day (TID) | ORAL | Status: DC | PRN

## 2024-03-03 MED ORDER — IPRATROPIUM-ALBUTEROL 0.5-2.5 (3) MG/3ML IN SOLN
3.0000 mL | Freq: Once | RESPIRATORY_TRACT | Status: AC
  Administered 2024-03-03: 3 mL via RESPIRATORY_TRACT
  Filled 2024-03-03: qty 3

## 2024-03-03 MED ORDER — ORAL CARE MOUTH RINSE: 15.0000 mL | OROMUCOSAL | Status: DC | PRN

## 2024-03-03 MED ORDER — LORAZEPAM 1 MG PO TABS
1.0000 mg | ORAL_TABLET | Freq: Four times a day (QID) | ORAL | Status: DC | PRN
  Administered 2024-03-04: 1 mg via ORAL
  Filled 2024-03-03: qty 1

## 2024-03-03 MED ORDER — ACETAMINOPHEN 325 MG PO TABS: 325.0000 mg | ORAL_TABLET | Freq: Four times a day (QID) | ORAL | Status: DC | PRN

## 2024-03-03 MED ORDER — POTASSIUM CHLORIDE CRYS ER 20 MEQ PO TBCR
40.0000 meq | EXTENDED_RELEASE_TABLET | Freq: Once | ORAL | Status: AC
  Administered 2024-03-03: 40 meq via ORAL
  Filled 2024-03-03: qty 2

## 2024-03-03 MED ORDER — ALBUTEROL SULFATE (2.5 MG/3ML) 0.083% IN NEBU: 2.5000 mg | INHALATION_SOLUTION | RESPIRATORY_TRACT | Status: DC | PRN

## 2024-03-03 MED ORDER — ALBUTEROL SULFATE (2.5 MG/3ML) 0.083% IN NEBU
5.0000 mg | INHALATION_SOLUTION | Freq: Once | RESPIRATORY_TRACT | Status: AC
  Administered 2024-03-03: 5 mg via RESPIRATORY_TRACT
  Filled 2024-03-03: qty 6

## 2024-03-03 MED ORDER — HEPARIN (PORCINE) 25000 UT/250ML-% IV SOLN
1200.0000 [IU]/h | INTRAVENOUS | Status: DC
  Administered 2024-03-04 (×2): 1200 [IU]/h via INTRAVENOUS
  Filled 2024-03-03 (×2): qty 250

## 2024-03-03 MED ORDER — METHYLPREDNISOLONE SODIUM SUCC 125 MG IJ SOLR
125.0000 mg | INTRAMUSCULAR | Status: AC
  Administered 2024-03-03: 125 mg via INTRAVENOUS
  Filled 2024-03-03: qty 2

## 2024-03-03 MED ORDER — OXYCODONE HCL 5 MG PO TABS: 10.0000 mg | ORAL_TABLET | ORAL | Status: DC | PRN

## 2024-03-03 MED ORDER — OXYCODONE-ACETAMINOPHEN 10-325 MG PO TABS: 1.0000 | ORAL_TABLET | ORAL | Status: DC | PRN

## 2024-03-03 MED ORDER — HYDRALAZINE HCL 20 MG/ML IJ SOLN: 5.0000 mg | INTRAMUSCULAR | Status: DC | PRN

## 2024-03-03 NOTE — ED Provider Notes (Signed)
 [facility] Provider Note    Event Date/Time   First MD Initiated Contact with Patient 03/03/24 1545     (approximate)   History   Chief Complaint: Shortness of Breath and AICD Problem   HPI  Brittany Nguyen is a 58 y.o. female with a history of CHF, ICD, GERD, COPD who comes ED complaining of cough starting yesterday which is nonproductive, associated with shortness of breath and sharp central chest pain today.  No aggravating alleviating factors, no fever.  Tried inhaler at home without relief.  Patient reports that her ICD shocked her twice today which occurred during a coughing fit both times.     Past Medical History:  Diagnosis Date   Acute anterolateral wall MI (HCC) 08/2016   Acute respiratory failure (HCC)    AICD (automatic cardioverter/defibrillator) present 03/29/2017   a.) Medtronic Evera MRI XT DR SureScan (SN: [id number])   Anxiety    a.) on BZO PRN (lorazepam )   Arthritis    Asthma 2013   Breast cancer, right (HCC)    C. 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   a.) Bx 02/22/2018 --> NE tumor cells --> AE1/AE3, chromogranin, synaptophysin (+); Ki-67 proliferation rate <1%; b.) repeat Bx 08/24/2018 --> well differentiard NET (G1)   CHF (congestive heart failure) (HCC)    Chronic, continuous use of opioids    a.) has naloxone  Rx available   COPD (chronic obstructive pulmonary disease) (HCC)    Coronary artery disease    Depression    Diet-controlled type 2 diabetes mellitus (HCC)    Diverticulitis 2010   DVT (deep venous thrombosis) (HCC)    Endometriosis 1990   Generalized epilepsy (HCC)    a.) on lacosamide ; b.) seizure 07-30-22 in setting of hypokalemia (K+ 2.6); c.) last seizure 11/13/2023 during blood draw   GERD (gastroesophageal reflux disease)    GIB (gastrointestinal bleeding) 01/08/2018   H/O syncope    HFrEF (heart failure with reduced ejection fraction) (HCC)     a.)TTE 12/09/16: EF 25-30%, dif HK; b.) TTE 02/08/17: EF 25%, dif HK, mild TR, G1DD; c.) TTE 04/25/17: EF 30-35%, mild LVH; d.) TTE 01/30/18: EF 45%, MAC, LAE, mild MR, G1DD; e.) TTE 11/06/2018: EF 25%, sev glob HK, triv MR/TR/PR; f.) TTE 05/12/19: EF 35%, MAC, AoV sclerosis, G1DD; g.) TTE 11/05/19: EF 20%, sev glob HK, triv MR; h.) TTE 06/17/20: EF 40%, mod glob HK, triv MR; I:) TTE 06/14/22: EF 25-30%, G1DD   History of cardiac catheterization    a.) [facility] 10/21/2014: normal coronaries; b.) [facility] 02/24/2017: normal coronaries, mRA 12, mPA 28, mPCWP 15, CO 8.0, CI 3.8   History of kidney stones    Hx MRSA infection    Hypertension    Hypokalemia    Hypothyroidism    a.) s/p thyroidectomy   Incomplete left bundle branch block (LBBB)    Iron  deficiency anemia 03/27/2018   Lump or mass in breast 11/27/2012   a.) Bx 11/27/2012 --> apocrine wall cyst   Migraine    Nausea & vomiting    Neuropathy    Non-ischemic cardiomyopathy (HCC)    a.) TTE 12/09/2016: EF 25-30%, b.) TTE 02/08/2017: EF 25%; c.) Medtronic AICD placed 03/29/2017; d.) TTE 04/25/2017: EF 30-35%; e.) TTE 01/30/2018: EF 45%; f.) TTE 11/06/2018: EF 25%; g.) TTE 05/12/2019: EF 35%; h.) TTE 11/05/2019: EF 20%; I.) TTE 06/17/2020: EF 40%; J.) TTE 06/14/2022: EF 25-30%   Obesity    OSA  on CPAP    PAF (paroxysmal atrial fibrillation) (HCC)    Pneumonia    PONV (postoperative nausea and vomiting)    Post-COVID chronic cough    Postcholecystectomy diarrhea    Prolonged Q-T interval on ECG    RAD (reactive airway disease)    Restless leg    a.) on ropinirole    Sepsis (HCC)    Unstable angina [facility])     Current Outpatient Rx   Order #: [id number] Class: Print   Order #: [id number] Class: Historical Med   Order #: [id number] Class: Historical Med   Order #: [id number] Class: No Print   Order #: [id number] Class: Normal   Order #: [id number] Class: Normal   Order #: [id number] Class: Historical Med   Order #: [id number] Class: Historical Med   Order #:  [id number] Class: Historical Med   Order #: [id number] Class: Historical Med   Order #: [id number] Class: Historical Med   Order #: [id number] Class: Historical Med   Order #: [id number] Class: Historical Med   Order #: [id number] Class: Historical Med   Order #: [id number] Class: Historical Med    Past Surgical History:  Procedure Laterality Date   ABDOMINAL HYSTERECTOMY     age 82   BREAST BIOPSY Right 2014   benign   BREAST BIOPSY Right 01/18/2023   Us  Core Bx, coil clip - path pending   BREAST BIOPSY Right 01/18/2023   US  RT BREAST BX W LOC DEV 1ST LESION IMG BX SPEC US  GUIDE 01/18/2023 [facility]   CARDIAC DEFIBRILLATOR PLACEMENT Left 03/29/2017   Procedure: MEDTRONIC CARDIAC DEFIBRILLATOR PLACEMENT (AICD); Location: [facility]; Surgeon: Lane Beard, MD   CESAREAN SECTION     CHOLECYSTECTOMY     COLECTOMY     COLONOSCOPY WITH PROPOFOL  N/A 02/22/2018   Procedure: COLONOSCOPY WITH PROPOFOL ;  Surgeon: Toledo, Ladell POUR, MD;  Location: [facility] ENDOSCOPY;  Service: Gastroenterology;  Laterality: N/A;   ESOPHAGOGASTRODUODENOSCOPY (EGD) WITH PROPOFOL  N/A 02/22/2018   Procedure: ESOPHAGOGASTRODUODENOSCOPY (EGD) WITH PROPOFOL ;  Surgeon: Toledo, Ladell POUR, MD;  Location: [facility] ENDOS[facility];  Service: Gastroenterology;  Laterality: N/A;   IVC FILTER INSERTION N/A 08/23/2022   Procedure: IVC FILTER INSERTION;  Surgeon: Jama Cordella MATSU, MD;  Location: [facility]ASIVE [facility];  Service: Cardiovascular;  Laterality: N/A;   IVC FILTER REMOVAL N/A 03/07/2023   Procedure: IVC FILTER REMOVAL;  Surgeon: Jama Cordella MATSU, MD;  Location: [facility];  Service: Cardiovascular;  Laterality: N/A;   KNEE ARTHROPLASTY Left 08/24/2022   Procedure: COMPUTER ASSISTED TOTAL KNEE ARTHROPLASTY;  Surgeon: Mardee Lynwood SQUIBB, MD;  Location: [facility] ORS;  Service: Orthopedics;  Laterality: Left;   KNEE ARTHROPLASTY Right 12/01/2023   Procedure: COMPUTER ASSISTED TOTAL KNEE ARTHROPLASTY;  Surgeon: Mardee Lynwood SQUIBB, MD;  Location:  [facility] ORS;  Service: Orthopedics;  Laterality: Right;   LEFT HEART CATH AND CORONARY ANGIOGRAPHY Left 10/21/2014   Procedure: LEFT HEART CATH AND CORONARY ANGIOGRAPHY; Location: [facility]; Surgeon: Wolm Rhyme, MD   NASAL SINUS SURGERY  2012   OOPHORECTOMY     RIGHT/LEFT HEART CATH AND CORONARY ANGIOGRAPHY Bilateral 02/24/2017   Procedure: RIGHT/LEFT HEART CATH AND CORONARY ANGIOGRAPHY; Location: [facility]   THYROIDECTOMY      Physical Exam   Triage Vital Signs: ED Triage Vitals  Encounter Vitals Group     BP 03/03/24 1546 105/71     Girls Systolic BP Percentile --      Girls Diastolic BP Percentile --      Boys Systolic BP Percentile --      Boys Diastolic BP Percentile --  Pulse Rate 03/03/24 1546 94     Resp 03/03/24 1546 19     Temp 03/03/24 1546 98.1 F (36.7 C)     Temp Source 03/03/24 1546 Oral     SpO2 03/03/24 1546 97 %     Weight 03/03/24 1550 200 lb (90.7 kg)     Height 03/03/24 1550 5' 5 (1.651 m)     Head Circumference --      Peak Flow --      Pain Score 03/03/24 1549 6     Pain Loc --      Pain Education --      Exclude from Growth Chart --     Most recent vital signs: Vitals:   03/03/24 1546  BP: 105/71  Pulse: 94  Resp: 19  Temp: 98.1 F (36.7 C)  SpO2: 97%    General: Awake, no distress.  CV:  Good peripheral perfusion.  Regular rate and rhythm Resp:  Normal effort.  Expiratory wheezing.  Crackles in left lower lung Abd:  No distention.  Soft nontender Other:  Symmetric calf circumference, no calf tenderness.  Trace peripheral edema bilaterally   ED Results / Procedures / Treatments   Labs (all labs ordered are listed, but only abnormal results are displayed) Labs Reviewed  BASIC METABOLIC PANEL WITH GFR - Abnormal; Notable for the following components:      Result Value   Potassium 3.2 (*)    Glucose, Bld 172 (*)    Calcium  8.5 (*)    All other components within normal limits  CBC WITH DIFFERENTIAL/PLATELET - Abnormal; Notable for the  following components:   Hemoglobin 10.7 (*)    HCT 34.9 (*)    MCH 25.4 (*)    RDW 15.8 (*)    All other components within normal limits  RESP PANEL BY RT-PCR (RSV, FLU A&B, COVID)  RVPGX2  MAGNESIUM   PHOSPHORUS  TROPONIN I (HIGH SENSITIVITY)     EKG Interpreted by me Sinus rhythm, rate of 93.  Left axis, prolonged QTc of 594 ms.  Poor R wave progression.  No acute ischemic changes.   RADIOLOGY Chest x-ray interpreted by me, unremarkable.  Radiology report reviewed   PROCEDURES:  Procedures   MEDICATIONS ORDERED IN ED: Medications  albuterol  (PROVENTIL ) (2.5 MG/3ML) 0.083% nebulizer solution 5 mg (has no administration in time range)  methylPREDNISolone  sodium succinate (SOLU-MEDROL ) 125 mg/2 mL injection 125 mg (125 mg Intravenous Given 03/03/24 1617)  ipratropium-albuterol  (DUONEB) 0.5-2.5 (3) MG/3ML nebulizer solution 3 mL (3 mLs Nebulization Given 03/03/24 1617)  benzonatate  (TESSALON ) capsule 200 mg (200 mg Oral Given 03/03/24 1817)  ketorolac  (TORADOL ) 15 MG/ML injection 15 mg (15 mg Intravenous Given 03/03/24 1818)     IMPRESSION / MDM / ASSESSMENT AND PLAN / ED COURSE  I reviewed the triage vital signs and the nursing notes.  DDx: Electrolyte derangement, AKI, COPD exacerbation, pneumonia, COVID, influenza, non-STEMI  Patient's presentation is most consistent with acute presentation with potential threat to life or bodily function.  Patient presents with cough chest pain shortness of breath, has wheezing and crackles on exam.  Most likely pulmonary in origin.  Doubt PE or dissection or pericardial effusion.  Will check labs chest x-ray, interrogate pacemaker for recent events, give Solu-Medrol  and DuoNeb.   Clinical Course as of 03/03/24 1821  Sun Mar 03, 2024  1820 Initial workup all reassuring.  Patient reports still having chest pain and shortness of breath, still has some expiratory wheezing.  Will give additional bronchodilators.  Additionally, attempted  interrogation of the patient's Medtronic ICD was unsuccessful in the ED.  [employer] was contacted who is unable to send a representative today.  With the patient experiencing 2 shocks, concern for unstable arrhythmia, will need to hospitalize for continued telemetry monitoring until device data can be accessed. [PS]    Clinical Course User Index [PS] Viviann Pastor, MD     FINAL CLINICAL IMPRESSION(S) / ED DIAGNOSES   Final diagnoses:  Chest pain with moderate risk for cardiac etiology  COPD exacerbation (HCC)     Rx / DC Orders   ED Discharge Orders     None        Note:  This document was prepared using Dragon voice recognition software and may include unintentional dictation errors.   Viviann Pastor, MD 03/03/24 [phone]

## 2024-03-03 NOTE — H&P (Incomplete)
 History and Physical    Brittany Nguyen FMW:[id number] DOB: [birth year] DOA: 03/03/2024  Referring MD/NP/PA:   PCP: Valora Agent, MD   Patient coming from:  The patient is coming from home.     Chief Complaint: SOB  HPI: Brittany Nguyen is a 58 y.o. female with medical history significant of      Data reviewed independently and ED Course: pt was found to have     ***       EKG: I have personally reviewed.  Not done in ED, will get one.   ***   Review of Systems:   General: no fevers, chills, no body weight gain, has poor appetite, has fatigue HEENT: no blurry vision, hearing changes or sore throat Respiratory: no dyspnea, coughing, wheezing CV: no chest pain, no palpitations GI: no nausea, vomiting, abdominal pain, diarrhea, constipation GU: no dysuria, burning on urination, increased urinary frequency, hematuria  Ext: no leg edema Neuro: no unilateral weakness, numbness, or tingling, no vision change or hearing loss Skin: no rash, no skin tear. MSK: No muscle spasm, no deformity, no limitation of range of movement in spin Heme: No easy bruising.  Travel history: No recent long distant travel.   Allergy:  Allergies  Allergen Reactions   Contrast Media [Iodinated Contrast Media] Shortness Of Breath    Per CT report in 2014 pt had break through contrast reaction with hives and SOB following 13 hour prep. MSY    Ferumoxytol  Nausea Only   Iron  Sucrose Anaphylaxis   Lidocaine  Hives   Metrizamide Shortness Of Breath   Penicillins Hives and Other (See Comments)    Tolerated ceftriaxone  in past IgE = 4 (WNL) on 08/12/2022  ediate rash, facial/tongue/throat swelling, SOB or lightheadedness with hypotension, tachy   Sacubitril-Valsartan     Sz (unclear if new start entresto vs hypoglycemia)   Isosorbide Nitrate Other (See Comments)    Headache   Latex Hives    IgE < 0.10 (WNL) on 08/21/2023   Ondansetron  Other (See Comments)    Severe headache   Tizanidine   Other (See Comments)    Feels altered   Povidone-Iodine  Rash    Blistering rash   Pulmicort  [Budesonide ] Itching    Past Medical History:  Diagnosis Date   Acute anterolateral wall MI (HCC) 08/2016   Acute respiratory failure (HCC)    AICD (automatic cardioverter/defibrillator) present 03/29/2017   a.) Medtronic Evera MRI XT DR SureScan (SN: [id number])   Anxiety    a.) on BZO PRN (lorazepam )   Arthritis    Asthma 2013   Breast cancer, right (HCC)    C. 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   a.) Bx 02/22/2018 --> NE tumor cells --> AE1/AE3, chromogranin, synaptophysin (+); Ki-67 proliferation rate <1%; b.) repeat Bx 08/24/2018 --> well differentiard NET (G1)   CHF (congestive heart failure) (HCC)    Chronic, continuous use of opioids    a.) has naloxone  Rx available   COPD (chronic obstructive pulmonary disease) (HCC)    Coronary artery disease    Depression    Diet-controlled type 2 diabetes mellitus (HCC)    Diverticulitis 2010   DVT (deep venous thrombosis) (HCC)    Endometriosis 1990   Generalized epilepsy (HCC)    a.) on lacosamide ; b.) seizure 07-30-22 in setting of hypokalemia (K+ 2.6); c.) last seizure 11/13/2023 during blood draw   GERD (gastroesophageal reflux disease)    GIB (gastrointestinal bleeding) 01/08/2018   H/O syncope  HFrEF (heart failure with reduced ejection fraction) (HCC)    a.)TTE 12/09/16: EF 25-30%, dif HK; b.) TTE 02/08/17: EF 25%, dif HK, mild TR, G1DD; c.) TTE 04/25/17: EF 30-35%, mild LVH; d.) TTE 01/30/18: EF 45%, MAC, LAE, mild MR, G1DD; e.) TTE 11/06/2018: EF 25%, sev glob HK, triv MR/TR/PR; f.) TTE 05/12/19: EF 35%, MAC, AoV sclerosis, G1DD; g.) TTE 11/05/19: EF 20%, sev glob HK, triv MR; h.) TTE 06/17/20: EF 40%, mod glob HK, triv MR; I:) TTE 06/14/22: EF 25-30%, G1DD   History of cardiac catheterization    a.) LHC 10/21/2014: normal coronaries; b.) R/LHC 02/24/2017: normal coronaries, mRA 12, mPA  28, mPCWP 15, CO 8.0, CI 3.8   History of kidney stones    Hx MRSA infection    Hypertension    Hypokalemia    Hypothyroidism    a.) s/p thyroidectomy   Incomplete left bundle branch block (LBBB)    Iron  deficiency anemia 03/27/2018   Lump or mass in breast 11/27/2012   a.) Bx 11/27/2012 --> apocrine wall cyst   Migraine    Nausea & vomiting    Neuropathy    Non-ischemic cardiomyopathy (HCC)    a.) TTE 12/09/2016: EF 25-30%, b.) TTE 02/08/2017: EF 25%; c.) Medtronic AICD placed 03/29/2017; d.) TTE 04/25/2017: EF 30-35%; e.) TTE 01/30/2018: EF 45%; f.) TTE 11/06/2018: EF 25%; g.) TTE 05/12/2019: EF 35%; h.) TTE 11/05/2019: EF 20%; I.) TTE 06/17/2020: EF 40%; J.) TTE 06/14/2022: EF 25-30%   Obesity    OSA on CPAP    PAF (paroxysmal atrial fibrillation) (HCC)    Pneumonia    PONV (postoperative nausea and vomiting)    Post-COVID chronic cough    Postcholecystectomy diarrhea    Prolonged Q-T interval on ECG    RAD (reactive airway disease)    Restless leg    a.) on ropinirole    Sepsis (HCC)    Unstable angina (HCC)     Past Surgical History:  Procedure Laterality Date   ABDOMINAL HYSTERECTOMY     age 27   BREAST BIOPSY Right 2014   benign   BREAST BIOPSY Right 01/18/2023   Us  Core Bx, coil clip - path pending   BREAST BIOPSY Right 01/18/2023   US  RT BREAST BX W LOC DEV 1ST LESION IMG BX SPEC US  GUIDE 01/18/2023 [facility]   CARDIAC DEFIBRILLATOR PLACEMENT Left 03/29/2017   Procedure: MEDTRONIC CARDIAC DEFIBRILLATOR PLACEMENT (AICD); Location: [facility]; Surgeon: Lane Beard, MD   CESAREAN SECTION     CHOLECYSTECTOMY     COLECTOMY     COLONOSCOPY WITH PROPOFOL  N/A 02/22/2018   Procedure: COLONOSCOPY WITH PROPOFOL ;  Surgeon: Toledo, Ladell POUR, MD;  Location: [facility];  Service: Gastroenterology;  Laterality: N/A;   ESOPHAGOGASTRODUODENOSCOPY (EGD) WITH PROPOFOL  N/A 02/22/2018   Procedure: ESOPHAGOGASTRODUODENOSCOPY (EGD) WITH PROPOFOL ;  Surgeon: Toledo, Ladell POUR,  MD;  Location: [facility];  Service: Gastroenterology;  Laterality: N/A;   IVC FILTER INSERTION N/A 08/23/2022   Procedure: IVC FILTER INSERTION;  Surgeon: Jama Cordella MATSU, MD;  Location: [facility];  Service: Cardiovascular;  Laterality: N/A;   IVC FILTER REMOVAL N/A 03/07/2023   Procedure: IVC FILTER REMOVAL;  Surgeon: Jama Cordella MATSU, MD;  Location: [facility];  Service: Cardiovascular;  Laterality: N/A;   KNEE ARTHROPLASTY Left 08/24/2022   Procedure: COMPUTER ASSISTED TOTAL KNEE ARTHROPLASTY;  Surgeon: Mardee Lynwood SQUIBB, MD;  Location: [facility];  Service: Orthopedics;  Laterality: Left;   KNEE ARTHROPLASTY Right 12/01/2023   Procedure: COMPUTER ASSISTED  TOTAL KNEE ARTHROPLASTY;  Surgeon: Mardee Lynwood SQUIBB, MD;  Location: [facility];  Service: Orthopedics;  Laterality: Right;   LEFT HEART CATH AND CORONARY ANGIOGRAPHY Left 10/21/2014   Procedure: LEFT HEART CATH AND CORONARY ANGIOGRAPHY; Location: [facility]; Surgeon: Wolm Rhyme, MD   NASAL SINUS SURGERY  2012   OOPHORECTOMY     RIGHT/LEFT HEART CATH AND CORONARY ANGIOGRAPHY Bilateral 02/24/2017   Procedure: RIGHT/LEFT HEART CATH AND CORONARY ANGIOGRAPHY; Location: [facility]   THYROIDECTOMY      Social History:  reports that she has never smoked. She has never used smokeless tobacco. She reports that she does not drink alcohol and does not use drugs.  Family History:  Family History  Problem Relation Age of Onset   Breast cancer Mother    Cancer Mother 43       ovarian   CAD Mother    Cancer Father 48       brain   CAD Father    Cancer Daughter 34       skin   Breast cancer Maternal Aunt    Cancer Maternal Aunt 80       breast   Leukemia Paternal Grandfather    Breast cancer Cousin      Prior to Admission medications   Medication Sig Start Date End Date Taking? Authorizing Provider  aspirin  81 MG chewable tablet Chew 1 tablet (81 mg total) by mouth 2 (two) times daily. 12/02/23   Kip Lynwood Double, PA-C   celecoxib  (CELEBREX ) 200 MG capsule Take 200 mg by mouth 2 (two) times daily. 12/03/23   [provider]  escitalopram  (LEXAPRO ) 10 MG tablet Take 10 mg by mouth daily. Patient not taking: Reported on 12/27/2023 11/24/22   [provider]  gabapentin  (NEURONTIN ) 300 MG capsule Take 1 capsule (300 mg total) by mouth 3 (three) times daily. 10/08/23   Ezenduka, Nkeiruka J, MD  levothyroxine  (SYNTHROID ) 175 MCG tablet Take 1 tablet (175 mcg total) by mouth daily at 6 (six) AM. 09/28/23 12/27/23  [place], Camellia PARAS, DO  LORazepam  (ATIVAN ) 1 MG tablet Take 1 tablet (1 mg total) by mouth 4 (four) times daily as needed. 11/10/21   Caleen Qualia, MD  losartan  (COZAAR ) 25 MG tablet Take 12.5 mg by mouth 2 (two) times daily. 12/08/23   [provider]  methadone  (DOLOPHINE ) 5 MG tablet Take 5 mg by mouth See admin instructions. Patient takes 1.5 tabs (7.5 mg) every morning, and 1.5 tabs (7.5 mg) midday,  and 2 tabs (10 mg) at bedtime.    [provider]  metoprolol  succinate (TOPROL -XL) 50 MG 24 hr tablet Take 50 mg by mouth daily. 12/08/23   [provider]  oxyCODONE -acetaminophen  (PERCOCET) 10-325 MG tablet Take 1 tablet by mouth every 4 (four) hours as needed for pain.    [provider]  Potassium Chloride  ER 20 MEQ TBCR Take 20 mEq by mouth daily. 12/08/23   [provider]  promethazine  (PHENERGAN ) 25 MG tablet Take 25 mg by mouth every 4 (four) hours as needed for nausea or vomiting.    [provider]  promethazine -dextromethorphan  (PROMETHAZINE -DM) 6.25-15 MG/5ML syrup Take 5 mLs by mouth every 6 (six) hours as needed for cough. 12/14/23   [provider]  rOPINIRole  (REQUIP ) 4 MG tablet Take 4 mg by mouth at bedtime. 01/30/19   [provider]  tiZANidine  (ZANAFLEX ) 4 MG capsule Take 4 mg by mouth 3 (three) times daily as needed for muscle spasms.    [provider]    Physical Exam: Vitals:   03/03/24 1546 03/03/24  1550  BP: 105/71   Pulse: 94   Resp: 19   Temp: 98.1 F (36.7 C)   TempSrc: Oral   SpO2: 97%   Weight:  90.7 kg  Height:  5' 5 (1.651 m)   General: Not in acute distress HEENT:       Eyes: PERRL, EOMI, no jaundice       ENT: No discharge from the ears and nose, no pharynx injection, no tonsillar enlargement.        Neck: No JVD, no bruit, no mass felt. Heme: No neck lymph node enlargement. Cardiac: S1/S2, RRR, No murmurs, No gallops or rubs. Respiratory: No rales, wheezing, rhonchi or rubs. GI: Soft, nondistended, nontender, no rebound pain, no organomegaly, BS present. GU: No hematuria Ext: No pitting leg edema bilaterally. 1+DP/PT pulse bilaterally. Musculoskeletal: No joint deformities, No joint redness or warmth, no limitation of ROM in spin. Skin: No rashes.  Neuro: Alert, oriented X3, cranial nerves II-XII grossly intact, moves all extremities normally. Muscle strength 5/5 in all extremities, sensation to light touch intact. Brachial reflex 2+ bilaterally. Knee reflex 1+ bilaterally. Negative Babinski's sign. Normal finger to nose test. Psych: Patient is not psychotic, no suicidal or hemocidal ideation.  Labs on Admission: I have personally reviewed following labs and imaging studies  CBC: Recent Labs  Lab 03/03/24 1604  WBC 6.5  NEUTROABS 3.7  HGB 10.7*  HCT 34.9*  MCV 82.9  PLT 242   Basic Metabolic Panel: Recent Labs  Lab 03/03/24 1604  NA 138  K 3.2*  CL 98  CO2 29  GLUCOSE 172*  BUN 9  CREATININE 0.88  CALCIUM  8.5*  MG 2.1  PHOS 2.9   GFR: Estimated Creatinine Clearance: 80.4 mL/min (by C-G formula based on SCr of 0.88 mg/dL). Liver Function Tests: No results for input(s): AST, ALT, ALKPHOS, BILITOT, PROT, ALBUMIN  in the last 168 hours. No results for input(s): LIPASE, AMYLASE in the last 168 hours. No results for input(s): AMMONIA in the last 168 hours. Coagulation Profile: No results for input(s): INR, PROTIME in the  last 168 hours. Cardiac Enzymes: No results for input(s): CKTOTAL, CKMB, CKMBINDEX, TROPONINI in the last 168 hours. BNP (last 3 results) No results for input(s): PROBNP in the last 8760 hours. HbA1C: No results for input(s): HGBA1C in the last 72 hours. CBG: No results for input(s): GLUCAP in the last 168 hours. Lipid Profile: No results for input(s): CHOL, HDL, LDLCALC, TRIG, CHOLHDL, LDLDIRECT in the last 72 hours. Thyroid  Function Tests: No results for input(s): TSH, T4TOTAL, FREET4, T3FREE, THYROIDAB in the last 72 hours. Anemia Panel: No results for input(s): VITAMINB12, FOLATE, FERRITIN, TIBC, IRON , RETICCTPCT in the last 72 hours. Urine analysis:    Component Value Date/Time   COLORURINE STRAW (A) 12/27/2023 1136   APPEARANCEUR CLEAR (A) 12/27/2023 1136   APPEARANCEUR Clear 09/02/2014 2312   LABSPEC 1.006 12/27/2023 1136   LABSPEC 1.013 09/02/2014 2312   PHURINE 5.0 12/27/2023 1136   GLUCOSEU NEGATIVE 12/27/2023 1136   GLUCOSEU Negative 09/02/2014 2312   HGBUR NEGATIVE 12/27/2023 1136   BILIRUBINUR NEGATIVE 12/27/2023 1136   BILIRUBINUR Negative 09/02/2014 2312   KETONESUR NEGATIVE 12/27/2023 1136   PROTEINUR NEGATIVE 12/27/2023 1136   NITRITE NEGATIVE 12/27/2023 1136   LEUKOCYTESUR NEGATIVE 12/27/2023 1136   LEUKOCYTESUR Negative 09/02/2014 2312   Sepsis Labs: @LABRCNTIP (procalcitonin:4,lacticidven:4) ) Recent Results (from the past 240 hours)  Resp panel by RT-PCR (RSV, Flu A&B,  Covid) Anterior Nasal Swab     Status: None   Collection Time: 03/03/24  4:05 PM   Specimen: Anterior Nasal Swab  Result Value Ref Range Status   SARS Coronavirus 2 by RT PCR NEGATIVE NEGATIVE Final    Comment: (NOTE) SARS-CoV-2 target nucleic acids are NOT DETECTED.  The SARS-CoV-2 RNA is generally detectable in upper respiratory specimens during the acute phase of infection. The lowest concentration of SARS-CoV-2 viral copies this  assay can detect is 138 copies/mL. A negative result does not preclude SARS-Cov-2 infection and should not be used as the sole basis for treatment or other patient management decisions. A negative result may occur with  improper specimen collection/handling, submission of specimen other than nasopharyngeal swab, presence of viral mutation(s) within the areas targeted by this assay, and inadequate number of viral copies(<138 copies/mL). A negative result must be combined with clinical observations, patient history, and epidemiological information. The expected result is Negative.  Fact Sheet for Patients:  BloggerCourse.com  Fact Sheet for Healthcare Providers:  SeriousBroker.it  This test is no t yet approved or cleared by the [place]  FDA and  has been authorized for detection and/or diagnosis of SARS-CoV-2 by FDA under an Emergency Use Authorization (EUA). This EUA will remain  in effect (meaning this test can be used) for the duration of the COVID-19 declaration under Section 564(b)(1) of the Act, 21 U.S.C.section 360bbb-3(b)(1), unless the authorization is terminated  or revoked sooner.       Influenza A by PCR NEGATIVE NEGATIVE Final   Influenza B by PCR NEGATIVE NEGATIVE Final    Comment: (NOTE) The Xpert Xpress SARS-CoV-2/FLU/RSV plus assay is intended as an aid in the diagnosis of influenza from Nasopharyngeal swab specimens and should not be used as a sole basis for treatment. Nasal washings and aspirates are unacceptable for Xpert Xpress SARS-CoV-2/FLU/RSV testing.  Fact Sheet for Patients: BloggerCourse.com  Fact Sheet for Healthcare Providers: SeriousBroker.it  This test is not yet approved or cleared by the [place]  FDA and has been authorized for detection and/or diagnosis of SARS-CoV-2 by FDA under an Emergency Use Authorization (EUA). This EUA will  remain in effect (meaning this test can be used) for the duration of the COVID-19 declaration under Section 564(b)(1) of the Act, 21 U.S.C. section 360bbb-3(b)(1), unless the authorization is terminated or revoked.     Resp Syncytial Virus by PCR NEGATIVE NEGATIVE Final    Comment: (NOTE) Fact Sheet for Patients: BloggerCourse.com  Fact Sheet for Healthcare Providers: SeriousBroker.it  This test is not yet approved or cleared by the [place]  FDA and has been authorized for detection and/or diagnosis of SARS-CoV-2 by FDA under an Emergency Use Authorization (EUA). This EUA will remain in effect (meaning this test can be used) for the duration of the COVID-19 declaration under Section 564(b)(1) of the Act, 21 U.S.C. section 360bbb-3(b)(1), unless the authorization is terminated or revoked.  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   QT prolongation   Acquired hypothyroidism   AICD discharge   HTN (hypertension)   Normocytic anemia   Seizure disorder (HCC)   Hypokalemia   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HCC)   Type 2 diabetes mellitus  without complication (HCC)   Depression with anxiety   OSA on CPAP   Obesity (BMI 30-39.9)   Assessment and Plan:   Principal Problem:   COPD exacerbation (HCC) Active Problems:   Hypothyroidism   Chest pain   CAD (coronary artery disease)   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              Yes, patient's    at bed side.       by phone   ***  Disposition Plan:  Anticipate discharge back to previous environment  Consults called:    Admission status and Level of care: Telemetry Medical:    for obs as inpt        Dispo: The patient is from: {From:23814}              Anticipated d/c is to: {To:23815}              Anticipated d/c date is: {Days:23816}              Patient currently {Medically stable:23817}    Severity of Illness:  {Observation/Inpatient:21159}       Date of Service 03/03/2024    [facility]   If 7PM-7AM, please contact night-coverage [email or web address] 03/03/2024, 7:34 PM

## 2024-03-03 NOTE — ED Triage Notes (Signed)
 Pt. In via POV from home, reports SOB all day, states her defibrillator has fired twice today, denies CP, N/V, HA

## 2024-03-03 NOTE — ED Notes (Signed)
 This Rn attempted to interrogate pt's defibrillator. Machine unable to connect to receive feedback. This RN attempted to contact [employer]tr[employer]'s and was told that it is currently after hours. This RN informed MD Viviann, and Magazine features editor.

## 2024-03-03 NOTE — Progress Notes (Signed)
 ANTICOAGULATION CONSULT NOTE  Pharmacy Consult for heparin  infusion Indication: suspecting PE, pending V/Q scan, positive D-dimer   Allergies  Allergen Reactions   Contrast Media [Iodinated Contrast Media] Shortness Of Breath    Per CT report in 2014 pt had break through contrast reaction with hives and SOB following 13 hour prep. MSY    Ferumoxytol  Nausea Only   Iron  Sucrose Anaphylaxis   Lidocaine  Hives   Metrizamide Shortness Of Breath   Penicillins Hives and Other (See Comments)    Tolerated ceftriaxone  in past IgE = 4 (WNL) on 08/12/2022  ediate rash, facial/tongue/throat swelling, SOB or lightheadedness with hypotension, tachy   Sacubitril-Valsartan     Sz (unclear if new start entresto vs hypoglycemia)   Isosorbide Nitrate Other (See Comments)    Headache   Latex Hives    IgE < 0.10 (WNL) on 08/21/2023   Ondansetron  Other (See Comments)    Severe headache   Tizanidine  Other (See Comments)    Feels altered   Povidone-Iodine  Rash    Blistering rash   Pulmicort  [Budesonide ] Itching    Patient Measurements: Height: 5' 5 (165.1 cm) Weight: 90.7 kg (200 lb) IBW/kg (Calculated) : 57 HEPARIN  DW (KG): 77.1  Vital Signs: Temp: 98.5 F (36.9 C) (06/29 2045) Temp Source: Oral (06/29 2045) BP: 100/70 (06/29 2045) Pulse Rate: 90 (06/29 2045)  Labs: Recent Labs    03/03/24 1604 03/03/24 2223  HGB 10.7*  --   HCT 34.9*  --   PLT 242  --   CREATININE 0.88  --   TROPONINIHS 3 <2    Estimated Creatinine Clearance: 80.4 mL/min (by C-G formula based on SCr of 0.88 mg/dL).   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   a.) Bx 02/22/2018 --> NE tumor cells --> AE1/AE3, chromogranin, synaptophysin (+); Ki-67 proliferation rate <1%; b.) repeat Bx 08/24/2018 --> well differentiard NET (G1)   CHF (congestive heart failure) (HCC)    Chronic, continuous use of opioids    a.) has naloxone  Rx available   COPD (chronic obstructive pulmonary disease) (HCC)    Coronary artery disease    Depression    Diet-controlled type 2 diabetes mellitus (HCC)    Diverticulitis 2010   DVT (deep venous thrombosis) (HCC)    Endometriosis 1990   Generalized epilepsy (HCC)    a.) on lacosamide ; b.) seizure 07-30-22 in setting of hypokalemia (K+ 2.6); c.) last seizure 11/13/2023 during blood draw   GERD (gastroesophageal reflux disease)    GIB (gastrointestinal bleeding) 01/08/2018   H/O syncope    HFrEF (heart failure with reduced ejection fraction) (HCC)    a.)TTE 12/09/16: EF 25-30%, dif HK; b.) TTE 02/08/17: EF 25%, dif HK, mild TR, G1DD; c.) TTE 04/25/17: EF 30-35%, mild LVH; d.) TTE 01/30/18: EF 45%, MAC, LAE, mild MR, G1DD; e.) TTE 11/06/2018: EF 25%, sev glob HK, triv MR/TR/PR; f.) TTE 05/12/19: EF 35%, MAC, AoV sclerosis, G1DD; g.) TTE 11/05/19: EF 20%, sev glob HK, triv MR; h.) TTE 06/17/20: EF 40%, mod glob HK, triv MR; I:) TTE 06/14/22:  EF 25-30%, G1DD   History of cardiac catheterization    a.) LHC 10/21/2014: normal coronaries; b.) R/LHC 02/24/2017: normal coronaries, mRA 12, mPA 28, mPCWP 15, CO 8.0, CI 3.8   History of kidney stones    Hx MRSA infection    Hypertension    Hypokalemia    Hypothyroidism    a.) s/p thyroidectomy   Incomplete left bundle branch block (LBBB)    Iron  deficiency anemia 03/27/2018   Lump or mass in breast 11/27/2012   a.) Bx 11/27/2012 --> apocrine wall cyst   Migraine    Nausea & vomiting    Neuropathy    Non-ischemic cardiomyopathy (HCC)    a.) TTE 12/09/2016: EF 25-30%, b.) TTE 02/08/2017: EF 25%; c.) Medtronic AICD placed 03/29/2017;  d.) TTE 04/25/2017: EF 30-35%; e.) TTE 01/30/2018: EF 45%; f.) TTE 11/06/2018: EF 25%; g.) TTE 05/12/2019: EF 35%; h.) TTE 11/05/2019: EF 20%; I.) TTE 06/17/2020: EF 40%; J.) TTE 06/14/2022: EF 25-30%   Obesity    OSA on CPAP    PAF (paroxysmal atrial fibrillation) (HCC)    Pneumonia    PONV (postoperative nausea and vomiting)    Post-COVID chronic cough    Postcholecystectomy diarrhea    Prolonged Q-T interval on ECG    RAD (reactive airway disease)    Restless leg    a.) on ropinirole    Sepsis (HCC)    Unstable angina (HCC)     Assessment: Pt is a 58 yo female presenting to ED reporting SOB all day & states her defibrillator has fired twice today.  Pt given enoxaparin  45 mg today @ 2132.   Goal of Therapy:  Heparin  level 0.3-0.7 units/ml Monitor platelets by anticoagulation protocol: Yes   Plan:  Will not given initial bolus d/t recent enoxaparin  administration. Start heparin  infusion at 1200 units/hr Will check HL in 6 hr after start of infusion CBC daily while on heparin   Brittany Nguyen, PharmD, [facility] 03/03/2024 11:43 PM

## 2024-03-03 NOTE — Progress Notes (Signed)
 PHARMACIST - PHYSICIAN COMMUNICATION  CONCERNING:  Enoxaparin  (Lovenox ) for DVT Prophylaxis    RECOMMENDATION: Patient was prescribed enoxaprin 40mg  q24 hours for VTE prophylaxis.   Filed Weights   03/03/24 1550  Weight: 90.7 kg (200 lb)    Body mass index is 33.28 kg/m.  Estimated Creatinine Clearance: 80.4 mL/min (by C-G formula based on SCr of 0.88 mg/dL).   Based on [facility] policy patient is candidate for enoxaparin  0.5mg /kg TBW SQ every 24 hours based on BMI being >30.  DESCRIPTION: Pharmacy has adjusted enoxaparin  dose per [facility] policy.  Patient is now receiving enoxaparin  45 mg every 24 hours    Olam KANDICE Fritter, PharmD Clinical Pharmacist  03/03/2024 7:37 PM

## 2024-03-04 ENCOUNTER — Observation Stay

## 2024-03-04 DIAGNOSIS — R7989 Other specified abnormal findings of blood chemistry: Secondary | ICD-10-CM | POA: Diagnosis not present

## 2024-03-04 DIAGNOSIS — R0602 Shortness of breath: Secondary | ICD-10-CM | POA: Diagnosis not present

## 2024-03-04 DIAGNOSIS — J441 Chronic obstructive pulmonary disease with (acute) exacerbation: Secondary | ICD-10-CM | POA: Diagnosis not present

## 2024-03-04 DIAGNOSIS — I82409 Acute embolism and thrombosis of unspecified deep veins of unspecified lower extremity: Secondary | ICD-10-CM | POA: Diagnosis not present

## 2024-03-04 LAB — CBC
HCT: 32.3 % — ABNORMAL LOW (ref 36.0–46.0)
Hemoglobin: 9.9 g/dL — ABNORMAL LOW (ref 12.0–15.0)
MCH: 24.9 pg — ABNORMAL LOW (ref 26.0–34.0)
MCHC: 30.7 g/dL (ref 30.0–36.0)
MCV: 81.4 fL (ref 80.0–100.0)
Platelets: 238 10*3/uL (ref 150–400)
RBC: 3.97 MIL/uL (ref 3.87–5.11)
RDW: 15.7 % — ABNORMAL HIGH (ref 11.5–15.5)
WBC: 8.4 10*3/uL (ref 4.0–10.5)
nRBC: 0 % (ref 0.0–0.2)

## 2024-03-04 LAB — APTT: aPTT: 52 s — ABNORMAL HIGH (ref 24–36)

## 2024-03-04 LAB — HEPARIN LEVEL (UNFRACTIONATED)
Heparin Unfractionated: 0.48 [IU]/mL (ref 0.30–0.70)
Heparin Unfractionated: 0.53 [IU]/mL (ref 0.30–0.70)

## 2024-03-04 LAB — BASIC METABOLIC PANEL WITH GFR
Anion gap: 12 (ref 5–15)
BUN: 14 mg/dL (ref 6–20)
CO2: 28 mmol/L (ref 22–32)
Calcium: 8.9 mg/dL (ref 8.9–10.3)
Chloride: 99 mmol/L (ref 98–111)
Creatinine, Ser: 1.22 mg/dL — ABNORMAL HIGH (ref 0.44–1.00)
GFR, Estimated: 52 mL/min — ABNORMAL LOW (ref >60)
Glucose, Bld: 234 mg/dL — ABNORMAL HIGH (ref 70–99)
Potassium: 4.2 mmol/L (ref 3.5–5.1)
Sodium: 139 mmol/L (ref 135–145)

## 2024-03-04 LAB — PROTIME-INR
INR: 1.1 (ref 0.8–1.2)
Prothrombin Time: 14.4 s (ref 11.4–15.2)

## 2024-03-04 MED ORDER — IPRATROPIUM-ALBUTEROL 0.5-2.5 (3) MG/3ML IN SOLN
3.0000 mL | Freq: Two times a day (BID) | RESPIRATORY_TRACT | Status: DC
  Administered 2024-03-04 – 2024-03-05 (×2): 3 mL via RESPIRATORY_TRACT
  Filled 2024-03-04 (×2): qty 3

## 2024-03-04 MED ORDER — TORSEMIDE 20 MG PO TABS
20.0000 mg | ORAL_TABLET | Freq: Every day | ORAL | Status: DC
  Administered 2024-03-04 – 2024-03-05 (×2): 20 mg via ORAL
  Filled 2024-03-04 (×2): qty 1

## 2024-03-04 MED ORDER — TECHNETIUM TO 99M ALBUMIN AGGREGATED
4.3200 | Freq: Once | INTRAVENOUS | Status: AC | PRN
  Administered 2024-03-04: 4.32 via INTRAVENOUS

## 2024-03-04 MED ORDER — METHADONE HCL 10 MG PO TABS
20.0000 mg | ORAL_TABLET | Freq: Every day | ORAL | Status: DC
  Administered 2024-03-04: 20 mg via ORAL
  Filled 2024-03-04: qty 2

## 2024-03-04 MED ORDER — METHADONE HCL 10 MG PO TABS
10.0000 mg | ORAL_TABLET | Freq: Two times a day (BID) | ORAL | Status: DC
  Administered 2024-03-04 – 2024-03-05 (×2): 10 mg via ORAL
  Filled 2024-03-04 (×2): qty 1

## 2024-03-04 MED ORDER — METOCLOPRAMIDE HCL 5 MG/ML IJ SOLN
10.0000 mg | Freq: Four times a day (QID) | INTRAMUSCULAR | Status: DC | PRN
  Administered 2024-03-04: 10 mg via INTRAVENOUS
  Filled 2024-03-04: qty 2

## 2024-03-04 NOTE — Care Management Obs Status (Signed)
 MEDICARE OBSERVATION STATUS NOTIFICATION   Patient Details  Name: Brittany Nguyen MRN: [id number] Date of Birth: 1965-11-18   Medicare Observation Status Notification Given:  No (patient did not want a copy)    [name] 03/04/2024, 4:56 PM

## 2024-03-04 NOTE — Plan of Care (Signed)
  Problem: Education: Goal: Knowledge of General Education information will improve Description: Including pain rating scale, medication(s)/side effects and non-pharmacologic comfort measures Outcome: Progressing   Problem: Health Behavior/Discharge Planning: Goal: Ability to manage health-related needs will improve Outcome: Progressing   Problem: Clinical Measurements: Goal: Ability to maintain clinical measurements within normal limits will improve Outcome: Progressing   Problem: Activity: Goal: Risk for activity intolerance will decrease Outcome: Progressing   Problem: Coping: Goal: Level of anxiety will decrease Outcome: Progressing   Problem: Pain Managment: Goal: General experience of comfort will improve and/or be controlled Outcome: Not Progressing

## 2024-03-04 NOTE — Care Management Obs Status (Signed)
 MEDICARE OBSERVATION STATUS NOTIFICATION   Patient Details  Name: Brittany Nguyen MRN: [id number] Date of Birth: 02-Jul-1966   Medicare Observation Status Notification Given:  patient off floor for test ([employer])     [name] 03/04/2024, 4:11 PM

## 2024-03-04 NOTE — Progress Notes (Addendum)
 EP brief note -   EP asked to interrogate device, patient concerned she had received an ICD shock.   MDT dual chamber ICD interrogated.  Battery good Lead measurements stable No NSVT episodes since 08/2023, no HV therapies.   Interrogation report printed and placed in patient's chart.   Patient is followed by [facility] EP.    EP will sign off at this time, but remains available.  Please consult if needed.    Elzada Pytel, NP Electrophysiology 03/04/24 3:50 PM

## 2024-03-04 NOTE — Progress Notes (Signed)
 ANTICOAGULATION CONSULT NOTE  Pharmacy Consult for heparin  infusion Indication: suspecting PE, pending V/Q scan, positive D-dimer   Allergies  Allergen Reactions   Contrast Media [Iodinated Contrast Media] Shortness Of Breath    Per CT report in 2014 pt had break through contrast reaction with hives and SOB following 13 hour prep. MSY    Ferumoxytol  Nausea Only   Iron  Sucrose Anaphylaxis   Lidocaine  Hives   Metrizamide Shortness Of Breath   Penicillins Hives and Other (See Comments)    Tolerated ceftriaxone  in past IgE = 4 (WNL) on 08/12/2022  ediate rash, facial/tongue/throat swelling, SOB or lightheadedness with hypotension, tachy   Sacubitril-Valsartan     Sz (unclear if new start entresto vs hypoglycemia)   Isosorbide Nitrate Other (See Comments)    Headache   Latex Hives    IgE < 0.10 (WNL) on 08/21/2023   Ondansetron  Other (See Comments)    Severe headache   Tizanidine  Other (See Comments)    Feels altered   Povidone-Iodine  Rash    Blistering rash   Pulmicort  [Budesonide ] Itching    Patient Measurements: Height: 5' 5 (165.1 cm) Weight: 90.7 kg (200 lb) IBW/kg (Calculated) : 57 HEPARIN  DW (KG): 77.1  Vital Signs: Temp: 98.5 F (36.9 C) (06/30 0743) Temp Source: Oral (06/30 0337) BP: 120/71 (06/30 0933) Pulse Rate: 91 (06/30 0933)  Labs: Recent Labs    03/03/24 1604 03/03/24 2223 03/04/24 0339 03/04/24 0759  HGB 10.7*  --  9.9*  --   HCT 34.9*  --  32.3*  --   PLT 242  --  238  --   APTT  --   --   --  52*  LABPROT  --   --   --  14.4  INR  --   --   --  1.1  HEPARINUNFRC  --   --   --  0.48  CREATININE 0.88  --  1.22*  --   TROPONINIHS 3 <2  --   --     Estimated Creatinine Clearance: 58 mL/min (A) (by C-G formula based on SCr of 1.22 mg/dL (H)).   Medical History: Past Medical History:  Diagnosis Date   Acute anterolateral wall MI (HCC) 08/2016   Acute respiratory failure (HCC)    AICD (automatic cardioverter/defibrillator) present  03/29/2017   a.) Medtronic Evera MRI XT DR SureScan (SN: [id number])   Anxiety    a.) on BZO PRN (lorazepam )   Arthritis    Asthma 2013   Breast cancer, right (HCC)    C. 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   a.) Bx 02/22/2018 --> NE tumor cells --> AE1/AE3, chromogranin, synaptophysin (+); Ki-67 proliferation rate <1%; b.) repeat Bx 08/24/2018 --> well differentiard NET (G1)   CHF (congestive heart failure) (HCC)    Chronic, continuous use of opioids    a.) has naloxone  Rx available   COPD (chronic obstructive pulmonary disease) (HCC)    Coronary artery disease    Depression    Diet-controlled type 2 diabetes mellitus (HCC)    Diverticulitis 2010   DVT (deep venous thrombosis) (HCC)    Endometriosis 1990   Generalized epilepsy (HCC)    a.) on lacosamide ; b.) seizure 07-30-22 in setting of hypokalemia (K+ 2.6); c.) last seizure 11/13/2023 during blood draw   GERD (gastroesophageal reflux disease)    GIB (gastrointestinal bleeding) 01/08/2018   H/O syncope    HFrEF (heart failure with reduced ejection fraction) (HCC)  a.)TTE 12/09/16: EF 25-30%, dif HK; b.) TTE 02/08/17: EF 25%, dif HK, mild TR, G1DD; c.) TTE 04/25/17: EF 30-35%, mild LVH; d.) TTE 01/30/18: EF 45%, MAC, LAE, mild MR, G1DD; e.) TTE 11/06/2018: EF 25%, sev glob HK, triv MR/TR/PR; f.) TTE 05/12/19: EF 35%, MAC, AoV sclerosis, G1DD; g.) TTE 11/05/19: EF 20%, sev glob HK, triv MR; h.) TTE 06/17/20: EF 40%, mod glob HK, triv MR; I:) TTE 06/14/22: EF 25-30%, G1DD   History of cardiac catheterization    a.) LHC 10/21/2014: normal coronaries; b.) R/LHC 02/24/2017: normal coronaries, mRA 12, mPA 28, mPCWP 15, CO 8.0, CI 3.8   History of kidney stones    Hx MRSA infection    Hypertension    Hypokalemia    Hypothyroidism    a.) s/p thyroidectomy   Incomplete left bundle branch block (LBBB)    Iron  deficiency anemia 03/27/2018   Lump or mass in breast 11/27/2012   a.) Bx 11/27/2012  --> apocrine wall cyst   Migraine    Nausea & vomiting    Neuropathy    Non-ischemic cardiomyopathy (HCC)    a.) TTE 12/09/2016: EF 25-30%, b.) TTE 02/08/2017: EF 25%; c.) Medtronic AICD placed 03/29/2017; d.) TTE 04/25/2017: EF 30-35%; e.) TTE 01/30/2018: EF 45%; f.) TTE 11/06/2018: EF 25%; g.) TTE 05/12/2019: EF 35%; h.) TTE 11/05/2019: EF 20%; I.) TTE 06/17/2020: EF 40%; J.) TTE 06/14/2022: EF 25-30%   Obesity    OSA on CPAP    PAF (paroxysmal atrial fibrillation) (HCC)    Pneumonia    PONV (postoperative nausea and vomiting)    Post-COVID chronic cough    Postcholecystectomy diarrhea    Prolonged Q-T interval on ECG    RAD (reactive airway disease)    Restless leg    a.) on ropinirole    Sepsis (HCC)    Unstable angina (HCC)     Assessment: Pt is a 58 yo female w/ PMH of anxiety, CHF, COPD, depression, HTN, hypothyroidism, RLS, seizure d/o presenting to ED reporting SOB all day & states her defibrillator has fired twice today.  Pt given enoxaparin  45 mg 06/29 @ 2132.   Goal of Therapy:  Heparin  level 0.3-0.7 units/ml Monitor platelets by anticoagulation protocol: Yes   Plan: heparin  level therapeutic  x 2 ---continue heparin  infusion at 1200 units/hr ---Will recheck heparin  level in am 07/01 given that we have a second consecutive therapeutic level ---CBC daily while on heparin   Adriana Bolster, PharmD, BCPS 03/04/2024 2:50 PM

## 2024-03-04 NOTE — Progress Notes (Signed)
 ANTICOAGULATION CONSULT NOTE  Pharmacy Consult for heparin  infusion Indication: suspecting PE, pending V/Q scan, positive D-dimer   Allergies  Allergen Reactions   Contrast Media [Iodinated Contrast Media] Shortness Of Breath    Per CT report in 2014 pt had break through contrast reaction with hives and SOB following 13 hour prep. MSY    Ferumoxytol  Nausea Only   Iron  Sucrose Anaphylaxis   Lidocaine  Hives   Metrizamide Shortness Of Breath   Penicillins Hives and Other (See Comments)    Tolerated ceftriaxone  in past IgE = 4 (WNL) on 08/12/2022  ediate rash, facial/tongue/throat swelling, SOB or lightheadedness with hypotension, tachy   Sacubitril-Valsartan     Sz (unclear if new start entresto vs hypoglycemia)   Isosorbide Nitrate Other (See Comments)    Headache   Latex Hives    IgE < 0.10 (WNL) on 08/21/2023   Ondansetron  Other (See Comments)    Severe headache   Tizanidine  Other (See Comments)    Feels altered   Povidone-Iodine  Rash    Blistering rash   Pulmicort  [Budesonide ] Itching    Patient Measurements: Height: 5' 5 (165.1 cm) Weight: 90.7 kg (200 lb) IBW/kg (Calculated) : 57 HEPARIN  DW (KG): 77.1  Vital Signs: Temp: 98.3 F (36.8 C) (06/30 0337) Temp Source: Oral (06/30 0337) BP: 103/70 (06/30 0337) Pulse Rate: 91 (06/30 0337)  Labs: Recent Labs    03/03/24 1604 03/03/24 2223 03/04/24 0339  HGB 10.7*  --  9.9*  HCT 34.9*  --  32.3*  PLT 242  --  238  CREATININE 0.88  --  1.22*  TROPONINIHS 3 <2  --     Estimated Creatinine Clearance: 58 mL/min (A) (by C-G formula based on SCr of 1.22 mg/dL (H)).   Medical History: Past Medical History:  Diagnosis Date   Acute anterolateral wall MI (HCC) 08/2016   Acute respiratory failure (HCC)    AICD (automatic cardioverter/defibrillator) present 03/29/2017   a.) Medtronic Evera MRI XT DR SureScan (SN: [id number])   Anxiety    a.) on BZO PRN (lorazepam )   Arthritis    Asthma 2013   Breast cancer,  right (HCC)    C. 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   a.) Bx 02/22/2018 --> NE tumor cells --> AE1/AE3, chromogranin, synaptophysin (+); Ki-67 proliferation rate <1%; b.) repeat Bx 08/24/2018 --> well differentiard NET (G1)   CHF (congestive heart failure) (HCC)    Chronic, continuous use of opioids    a.) has naloxone  Rx available   COPD (chronic obstructive pulmonary disease) (HCC)    Coronary artery disease    Depression    Diet-controlled type 2 diabetes mellitus (HCC)    Diverticulitis 2010   DVT (deep venous thrombosis) (HCC)    Endometriosis 1990   Generalized epilepsy (HCC)    a.) on lacosamide ; b.) seizure 07-30-22 in setting of hypokalemia (K+ 2.6); c.) last seizure 11/13/2023 during blood draw   GERD (gastroesophageal reflux disease)    GIB (gastrointestinal bleeding) 01/08/2018   H/O syncope    HFrEF (heart failure with reduced ejection fraction) (HCC)    a.)TTE 12/09/16: EF 25-30%, dif HK; b.) TTE 02/08/17: EF 25%, dif HK, mild TR, G1DD; c.) TTE 04/25/17: EF 30-35%, mild LVH; d.) TTE 01/30/18: EF 45%, MAC, LAE, mild MR, G1DD; e.) TTE 11/06/2018: EF 25%, sev glob HK, triv MR/TR/PR; f.) TTE 05/12/19: EF 35%, MAC, AoV sclerosis, G1DD; g.) TTE 11/05/19: EF 20%, sev glob HK, triv MR; h.) TTE  06/17/20: EF 40%, mod glob HK, triv MR; I:) TTE 06/14/22: EF 25-30%, G1DD   History of cardiac catheterization    a.) LHC 10/21/2014: normal coronaries; b.) R/LHC 02/24/2017: normal coronaries, mRA 12, mPA 28, mPCWP 15, CO 8.0, CI 3.8   History of kidney stones    Hx MRSA infection    Hypertension    Hypokalemia    Hypothyroidism    a.) s/p thyroidectomy   Incomplete left bundle branch block (LBBB)    Iron  deficiency anemia 03/27/2018   Lump or mass in breast 11/27/2012   a.) Bx 11/27/2012 --> apocrine wall cyst   Migraine    Nausea & vomiting    Neuropathy    Non-ischemic cardiomyopathy (HCC)    a.) TTE 12/09/2016: EF 25-30%, b.) TTE  02/08/2017: EF 25%; c.) Medtronic AICD placed 03/29/2017; d.) TTE 04/25/2017: EF 30-35%; e.) TTE 01/30/2018: EF 45%; f.) TTE 11/06/2018: EF 25%; g.) TTE 05/12/2019: EF 35%; h.) TTE 11/05/2019: EF 20%; I.) TTE 06/17/2020: EF 40%; J.) TTE 06/14/2022: EF 25-30%   Obesity    OSA on CPAP    PAF (paroxysmal atrial fibrillation) (HCC)    Pneumonia    PONV (postoperative nausea and vomiting)    Post-COVID chronic cough    Postcholecystectomy diarrhea    Prolonged Q-T interval on ECG    RAD (reactive airway disease)    Restless leg    a.) on ropinirole    Sepsis (HCC)    Unstable angina (HCC)     Assessment: Pt is a 58 yo female w/ PMH of anxiety, CHF, COPD, depression, HTN, hypothyroidism, RLS, seizure d/o presenting to ED reporting SOB all day & states her defibrillator has fired twice today.  Pt given enoxaparin  45 mg 06/29 @ 2132.   Goal of Therapy:  Heparin  level 0.3-0.7 units/ml Monitor platelets by anticoagulation protocol: Yes   Plan: heparin  level therapeutic  x 1 ---continue heparin  infusion at 1200 units/hr ---Will recheck heparin  level in 6 hr after start of infusion ---CBC daily while on heparin   Adriana Bolster, PharmD, BCPS 03/04/2024 7:01 AM

## 2024-03-04 NOTE — Progress Notes (Signed)
 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  Troponin negative x 2.   Elevated d-dimer: continue on IV heparin . VQ scan ordered   Seizure: continue on home gabapentin , lacosamide . Seizure precautions  Hypokalemia: WNL today    Prolonged QT interval: continue on tele. Holding requip , lexapro . Continue on tele   Hx of CAD:  trop negative x 2.   Chronic combined CHF: secondary to NICM s/p AICD.Appears compensated. Echo on 12/24/2023 showed EF of 45-50%. Continue on torsemide   PAF: continue on home dose of metoprolol . IV heparin    AKI: Cr is trending up from day prior. Avoid nephrotoxic meds    DM2: HbA1c 6.4, well-controlled. Continue on aspart    HTN: continue on home dose of losartan , metoprolol     Hypothyroidism: continue on levothyroxine     Depression: severity unknown. 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 On somatuline q28 days   OSA: on CPAP   AICD discharge: it happened due to coughing spell. Needs interrogation, messaged EP   Obesity: BMI 33.2. Would benefit from weight loss       DVT prophylaxis: IV heparin  Code Status: full  Family Communication:  Disposition Plan:  likely d/c back home   Level of care: Telemetry Medical  Status is: Observation The patient remains OBS appropriate and will d/c before 2 midnights.    Consultants:    Procedures:   Antimicrobials:    Subjective: Pt c/o shortness of breath   Objective: Vitals:   03/04/24 0337 03/04/24 0727 03/04/24 0743 03/04/24 0753  BP: 103/70  (!) 93/51 100/68  Pulse: 91  80 81  Resp: 16  16   Temp: 98.3 F (36.8 C)  98.5 F (36.9 C)   TempSrc: Oral     SpO2: (!) 89% 97% 90%   Weight:      Height:       No intake or output data in the 24 hours ending 03/04/24 0820 Filed Weights   03/03/24 1550  Weight: 90.7 kg    Examination:  General exam: Appears calm and comfortable  Respiratory system: diminished breath sounds b/l  Cardiovascular system: S1 & S2 +. No rubs, gallops or clicks.  Gastrointestinal system: Abdomen is obese, soft and nontender. Normal bowel sounds heard. Central nervous system: Alert and oriented. Moves all extremities Psychiatry: Judgement and insight appear normal. Flat mood and affect     Data Reviewed: I have personally reviewed following labs and imaging studies  CBC: Recent Labs  Lab 03/03/24 1604 03/04/24 0339  WBC 6.5 8.4  NEUTROABS 3.7  --   HGB 10.7* 9.9*  HCT 34.9* 32.3*  MCV 82.9 81.4  PLT 242 238   Basic Metabolic Panel: Recent  Labs  Lab 03/03/24 1604 03/04/24 0339  NA 138 139  K 3.2* 4.2  CL 98 99  CO2 29 28  GLUCOSE 172* 234*  BUN 9 14  CREATININE 0.88 1.22*  CALCIUM  8.5* 8.9  MG 2.1  --   PHOS 2.9  --    GFR: Estimated Creatinine Clearance: 58 mL/min (A) (by C-G formula based on SCr of 1.22 mg/dL (H)). Liver Function Tests: No results for input(s): AST, ALT, ALKPHOS, BILITOT, PROT, ALBUMIN  in the last 168 hours. No results for input(s): LIPASE, AMYLASE in the last 168 hours. No results for input(s): AMMONIA in the last 168 hours. Coagulation Profile: No results for input(s): INR, PROTIME in  the last 168 hours. Cardiac Enzymes: No results for input(s): CKTOTAL, CKMB, CKMBINDEX, TROPONINI in the last 168 hours. BNP (last 3 results) No results for input(s): PROBNP in the last 8760 hours. HbA1C: No results for input(s): HGBA1C in the last 72 hours. CBG: No results for input(s): GLUCAP in the last 168 hours. Lipid Profile: No results for input(s): CHOL, HDL, LDLCALC, TRIG, CHOLHDL, LDLDIRECT in the last 72 hours. Thyroid  Function Tests: No results for input(s): TSH, T4TOTAL, FREET4, T3FREE, THYROIDAB in the last 72 hours. Anemia Panel: No results for input(s): VITAMINB12, FOLATE, FERRITIN, TIBC, IRON , RETICCTPCT in the last 72 hours. Sepsis Labs: No results for input(s): PROCALCITON, LATICACIDVEN in the last 168 hours.  Recent Results (from the past 240 hours)  Resp panel by RT-PCR (RSV, Flu A&B, Covid) Anterior Nasal Swab     Status: None   Collection Time: 03/03/24  4:05 PM   Specimen: Anterior Nasal Swab  Result Value Ref Range Status   SARS Coronavirus 2 by RT PCR NEGATIVE NEGATIVE Final    Comment: (NOTE) SARS-CoV-2 target nucleic acids are NOT DETECTED.  The SARS-CoV-2 RNA is generally detectable in upper respiratory specimens during the acute phase of infection. The lowest concentration of SARS-CoV-2 viral copies this assay can detect is 138 copies/mL. A negative result does not preclude SARS-Cov-2 infection and should not be used as the sole basis for treatment or other patient management decisions. A negative result may occur with  improper specimen collection/handling, submission of specimen other than nasopharyngeal swab, presence of viral mutation(s) within the areas targeted by this assay, and inadequate number of viral copies(<138 copies/mL). A negative result must be combined with clinical observations, patient history, and epidemiological information. The expected result is Negative.  Fact Sheet for  Patients:  BloggerCourse.com  Fact Sheet for Healthcare Providers:  SeriousBroker.it  This test is no t yet approved or cleared by the [place]  FDA and  has been authorized for detection and/or diagnosis of SARS-CoV-2 by FDA under an Emergency Use Authorization (EUA). This EUA will remain  in effect (meaning this test can be used) for the duration of the COVID-19 declaration under Section 564(b)(1) of the Act, 21 U.S.C.section 360bbb-3(b)(1), unless the authorization is terminated  or revoked sooner.       Influenza A by PCR NEGATIVE NEGATIVE Final   Influenza B by PCR NEGATIVE NEGATIVE Final    Comment: (NOTE) The Xpert Xpress SARS-CoV-2/FLU/RSV plus assay is intended as an aid in the diagnosis of influenza from Nasopharyngeal swab specimens and should not be used as a sole basis for treatment. Nasal washings and aspirates are unacceptable for Xpert Xpress SARS-CoV-2/FLU/RSV testing.  Fact Sheet for Patients: BloggerCourse.com  Fact Sheet for Healthcare Providers: SeriousBroker.it  This test is not yet approved or cleared by the [place]  FDA  and has been authorized for detection and/or diagnosis of SARS-CoV-2 by FDA under an Emergency Use Authorization (EUA). This EUA will remain in effect (meaning this test can be used) for the duration of the COVID-19 declaration under Section 564(b)(1) of the Act, 21 U.S.C. section 360bbb-3(b)(1), unless the authorization is terminated or revoked.     Resp Syncytial Virus by PCR NEGATIVE NEGATIVE Final    Comment: (NOTE) Fact Sheet for Patients: BloggerCourse.com  Fact Sheet for Healthcare Providers: SeriousBroker.it  This test is not yet approved or cleared by the [place]  FDA and has been authorized for detection and/or diagnosis of SARS-CoV-2 by FDA under an Emergency  Use Authorization (EUA). This EUA will remain in effect (meaning this test can be used) for the duration of the COVID-19 declaration under Section 564(b)(1) of the Act, 21 U.S.C. section 360bbb-3(b)(1), unless the authorization is terminated or revoked.  Performed at [facility], [address] [place], [place] [address]          Radiology Studies: DG Chest Portable 1 View Result Date: 03/03/2024 CLINICAL DATA:  sob, cough EXAM: PORTABLE CHEST - 1 VIEW COMPARISON:  12/27/2023 FINDINGS: Lungs are clear. Heart size and mediastinal contours are within normal limits. Stable left subclavian AICD. No effusion. Visualized bones unremarkable. IMPRESSION: No acute cardiopulmonary disease. Electronically Signed   By: JONETTA Faes M.D.   On: 03/03/2024 16:40        Scheduled Meds:  gabapentin   300 mg Oral BID   insulin  aspart  10 Units Intravenous Once   ipratropium-albuterol   3 mL Nebulization BID   lacosamide   200 mg Oral BID   levothyroxine   200 mcg Oral Q0600   losartan   12.5 mg Oral BID   methadone   10 mg Oral BID   And   methadone   15 mg Oral QHS   methylPREDNISolone  (SOLU-MEDROL ) injection  80 mg Intravenous Daily   metoprolol  succinate  50 mg Oral BID   torsemide   20 mg Oral Daily   Continuous Infusions:  heparin  1,200 Units/hr (03/04/24 0111)     LOS: 0 days       Anthony CHRISTELLA Pouch, MD [facility] Pager [phone]  If 7PM-7AM, please contact night-coverage [email or web address] 03/04/2024, 8:20 AM

## 2024-03-05 DIAGNOSIS — J441 Chronic obstructive pulmonary disease with (acute) exacerbation: Secondary | ICD-10-CM | POA: Diagnosis not present

## 2024-03-05 LAB — GLUCOSE, CAPILLARY: Glucose-Capillary: 149 mg/dL — ABNORMAL HIGH (ref 70–99)

## 2024-03-05 LAB — BASIC METABOLIC PANEL WITH GFR
Anion gap: 10 (ref 5–15)
BUN: 23 mg/dL — ABNORMAL HIGH (ref 6–20)
CO2: 29 mmol/L (ref 22–32)
Calcium: 9 mg/dL (ref 8.9–10.3)
Chloride: 104 mmol/L (ref 98–111)
Creatinine, Ser: 0.86 mg/dL (ref 0.44–1.00)
GFR, Estimated: >60 mL/min (ref >60)
Glucose, Bld: 167 mg/dL — ABNORMAL HIGH (ref 70–99)
Potassium: 3.4 mmol/L — ABNORMAL LOW (ref 3.5–5.1)
Sodium: 143 mmol/L (ref 135–145)

## 2024-03-05 LAB — CBC
HCT: 29.4 % — ABNORMAL LOW (ref 36.0–46.0)
Hemoglobin: 9 g/dL — ABNORMAL LOW (ref 12.0–15.0)
MCH: 25.1 pg — ABNORMAL LOW (ref 26.0–34.0)
MCHC: 30.6 g/dL (ref 30.0–36.0)
MCV: 81.9 fL (ref 80.0–100.0)
Platelets: 244 10*3/uL (ref 150–400)
RBC: 3.59 MIL/uL — ABNORMAL LOW (ref 3.87–5.11)
RDW: 16 % — ABNORMAL HIGH (ref 11.5–15.5)
WBC: 12.4 10*3/uL — ABNORMAL HIGH (ref 4.0–10.5)
nRBC: 0 % (ref 0.0–0.2)

## 2024-03-05 LAB — PROCALCITONIN: Procalcitonin: <0.1 ng/mL

## 2024-03-05 LAB — HEPARIN LEVEL (UNFRACTIONATED): Heparin Unfractionated: 0.5 [IU]/mL (ref 0.30–0.70)

## 2024-03-05 MED ORDER — PREDNISONE 10 MG PO TABS: ORAL_TABLET | ORAL | 0 refills | Status: DC

## 2024-03-05 NOTE — Discharge Summary (Signed)
 Physician Discharge Summary  Marilu Rylander Priestly FMW:[id number] DOB: 1966/08/30 DOA: 03/03/2024  PCP: Valora Agent, MD  Admit date: 03/03/2024 Discharge date: 03/05/2024  Admitted From: home  Disposition: home   Recommendations for Outpatient Follow-up:  Follow up with PCP in 1-2 weeks   Home Health: no  Equipment/Devices:  Discharge Condition: stable  CODE STATUS: full Diet recommendation: Heart Healthy / Carb Modified  Brief/Interim Summary: HPI was taken from Dr. Hilma: Bascom Glance Boyack is a 58 y.o. female with medical history significant of seizure, QTc prolongation, hypertension, diet-controlled diabetes, COPD/asthma, CAD, myocardial infarction, CHF with EF of 45%, AICD, hypothyroidism, GERD, depression with anxiety, DVT not on anticoagulants (s/p of right IVC filter placement per pt), carcinoid tumor of the stomach, chronic pain, who presents with SOB.   Patient states that she has SOB for whole day, which has been been progressively worsening.  Patient has dry cough, mild wheezing, no fever or chills.  She also reports chest pain, which is located in the front chest, sharp, moderate, radiating to the left shoulder, pleuritic, aggravated by deep breath.  Patient does not have nausea, vomiting, diarrhea or abdominal pain.  No symptoms of UTI.  No recent fall or head injury.  Denies rectal bleeding or dark stool.   Data reviewed independently and ED Course: pt was found to have D-dimer 2.46, potassium 3.0, magnesium  2.1, phosphorus of 2.9, GFR> 60, troponin 3 --> less than 2.0, temperature normal, blood pressure 105/71, heart rate 94, RR 19, oxygen  saturation 97% on room air.  Chest x-ray negative.  Patient is placed in telemetry bed for observation.   EKG: I have personally reviewed.  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  Troponin negative x 2.   Elevated d-dimer: VQ scan neg for PE. US  b/l LE neg for DVT. D/c IV heparin     Seizure: continue on home gabapentin , lacosamide . Seizure precautions  Hypokalemia: WNL today    Prolonged QT interval: continue on tele.   Hx of CAD:  trop negative x 2.   Chronic combined CHF: secondary to NICM s/p AICD.Appears compensated. Echo on 12/24/2023 showed EF of 45-50%. Continue on torsemide   PAF: continue on home dose of metoprolol . D/C IV heparin . Not on chronic anticoagulation at home   AKI: Cr is trending up from day prior. Avoid nephrotoxic meds    DM2: HbA1c 6.4, well-controlled. Diet controlled    HTN: continue on home dose of losartan , metoprolol     Hypothyroidism: continue on levothyroxine     Depression: severity unknown. 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 On somatuline q28 days   OSA: on CPAP   AICD discharge: it happened due to coughing spell. MDT dual chamber ICD interrogated.  Battery good. Lead measurements stable. No NSVT episodes since  08/2023, no HV therapies as per EP NP.    Obesity: BMI 33.2. Would benefit from weight loss   Discharge Instructions  Discharge Instructions     Diet - low sodium heart healthy   Complete by: As directed    Discharge instructions   Complete by: As directed    F/u w/ PCP in 1-2 weeks.   Increase activity slowly   Complete by: As directed       Allergies  as of 03/05/2024       Reactions   Contrast Media [iodinated Contrast Media] Shortness Of Breath   Per CT report in 2014 pt had break through contrast reaction with hives and SOB following 13 hour prep. MSY    Ferumoxytol  Nausea Only   Iron  Sucrose Anaphylaxis   Lidocaine  Hives   Metrizamide Shortness Of Breath   Penicillins Hives, Other (See Comments)   Tolerated ceftriaxone  in past IgE = 4 (WNL) on 08/12/2022 ediate rash, facial/tongue/throat swelling, SOB or lightheadedness with hypotension, tachy   Sacubitril-valsartan    Sz (unclear if new start entresto vs hypoglycemia)   Isosorbide Nitrate Other (See Comments)   Headache   Latex Hives   IgE < 0.10 (WNL) on 08/21/2023   Ondansetron  Other (See Comments)   Severe headache   Tizanidine  Other (See Comments)   Feels altered   Povidone-iodine  Rash   Blistering rash   Pulmicort  [budesonide ] Itching        Medication List     STOP taking these medications    promethazine -dextromethorphan  6.25-15 MG/5ML syrup Commonly known as: PROMETHAZINE -DM       TAKE these medications    gabapentin  300 MG capsule Commonly known as: NEURONTIN  Take 1 capsule (300 mg total) by mouth 3 (three) times daily.   Lacosamide  100 MG Tabs Take 200 mg by mouth 2 (two) times daily.   levothyroxine  200 MCG tablet Commonly known as: SYNTHROID  Take 200 mcg by mouth daily before breakfast.   LORazepam  1 MG tablet Commonly known as: ATIVAN  Take 1 tablet (1 mg total) by mouth 4 (four) times daily as needed.   losartan  25 MG tablet Commonly known as: COZAAR  Take 12.5 mg by mouth 2 (two) times daily.   methadone  10 MG tablet Commonly known as: DOLOPHINE  Take 10-20 mg by mouth 3 (three) times daily. 10 MG BID and 20 mg HS   metoprolol  succinate 50 MG 24 hr tablet Commonly known as: TOPROL -XL Take 50 mg by mouth 2 (two) times daily.   oxyCODONE -acetaminophen  10-325 MG tablet Commonly known as: PERCOCET Take 1 tablet by mouth every 4  (four) hours as needed for pain.   Potassium Chloride  ER 20 MEQ Tbcr Take 20 mEq by mouth 2 (two) times daily. When taking torsemide    predniSONE  10 MG tablet Commonly known as: DELTASONE  40mg  daily x 2 days, 30mg  daily x 2 days, 20mg  daily x 2 days, 10mg  daily x 2days then stop   promethazine  25 MG tablet Commonly known as: PHENERGAN  Take 25 mg by mouth every 4 (four) hours as needed for nausea or vomiting.   rOPINIRole  4 MG tablet Commonly known as: REQUIP  Take 4 mg by mouth at bedtime.   spironolactone  50 MG tablet Commonly known as: ALDACTONE  Take 50 mg by mouth 2 (two) times daily.   tiZANidine  4 MG capsule Commonly known as: ZANAFLEX  Take 4 mg by mouth at bedtime.   torsemide  20 MG tablet Commonly known as: DEMADEX  Take 20 mg by mouth 2 (two) times daily as needed (  fluid).        Allergies  Allergen Reactions   Contrast Media [Iodinated Contrast Media] Shortness Of Breath    Per CT report in 2014 pt had break through contrast reaction with hives and SOB following 13 hour prep. MSY    Ferumoxytol  Nausea Only   Iron  Sucrose Anaphylaxis   Lidocaine  Hives   Metrizamide Shortness Of Breath   Penicillins Hives and Other (See Comments)    Tolerated ceftriaxone  in past IgE = 4 (WNL) on 08/12/2022  ediate rash, facial/tongue/throat swelling, SOB or lightheadedness with hypotension, tachy   Sacubitril-Valsartan     Sz (unclear if new start entresto vs hypoglycemia)   Isosorbide Nitrate Other (See Comments)    Headache   Latex Hives    IgE < 0.10 (WNL) on 08/21/2023   Ondansetron  Other (See Comments)    Severe headache   Tizanidine  Other (See Comments)    Feels altered   Povidone-Iodine  Rash    Blistering rash   Pulmicort  [Budesonide ] Itching    Consultations:    Procedures/Studies: NM Pulmonary Perfusion Result Date: 03/04/2024 CLINICAL DATA:  Short of breath.  Concern pulmonary embolism EXAM: NUCLEAR MEDICINE PERFUSION LUNG SCAN TECHNIQUE: Perfusion  images were obtained in multiple projections after intravenous injection of radiopharmaceutical. RADIOPHARMACEUTICALS:  4.32 mCi Tc-66m MAA COMPARISON:  None Available. FINDINGS: No filling defects within the pulmonary arteries to suggest acute pulmonary embolism. IMPRESSION: No evidence acute pulmonary embolism. Electronically Signed   By: Jackquline Boxer M.D.   On: 03/04/2024 17:27   US  Venous Img Lower Bilateral (DVT) Result Date: 03/04/2024 CLINICAL DATA:  Positive D-dimer in a patient with a history of prior DVT/PE/IVC filter. EXAM: Bilateral LOWER EXTREMITY VENOUS DOPPLER ULTRASOUND TECHNIQUE: Gray-scale sonography with compression, as well as color and duplex ultrasound, were performed to evaluate the deep venous system(s) from the level of the common femoral vein through the popliteal and proximal calf veins. COMPARISON:  None Available. FINDINGS: VENOUS Normal compressibility of the common femoral, superficial femoral, and popliteal veins, as well as the visualized calf veins. Visualized portions of profunda femoral vein and great saphenous vein unremarkable. No filling defects to suggest DVT on grayscale or color Doppler imaging. Doppler waveforms show normal direction of venous flow, normal respiratory plasticity and response to augmentation. Limited views of the contralateral common femoral vein are unremarkable. OTHER None. Limitations: none IMPRESSION: Negative for DVT bilateral lower extremities Electronically Signed   By: Cordella Banner   On: 03/04/2024 13:29   DG Chest Portable 1 View Result Date: 03/03/2024 CLINICAL DATA:  sob, cough EXAM: PORTABLE CHEST - 1 VIEW COMPARISON:  12/27/2023 FINDINGS: Lungs are clear. Heart size and mediastinal contours are within normal limits. Stable left subclavian AICD. No effusion. Visualized bones unremarkable. IMPRESSION: No acute cardiopulmonary disease. Electronically Signed   By: JONETTA Faes M.D.   On: 03/03/2024 16:40   (Echo, Carotid, EGD,  Colonoscopy, ERCP)    Subjective: Pt c/o fatigue.    Discharge Exam: Vitals:   03/05/24 0723 03/05/24 0838  BP:  107/62  Pulse:  76  Resp:  18  Temp:  98 F (36.7 C)  SpO2: 99% 94%   Vitals:   03/04/24 1940 03/05/24 0235 03/05/24 0723 03/05/24 0838  BP: 106/68 (!) 88/59  107/62  Pulse: 82 77  76  Resp: 16 18  18   Temp: 98.4 F (36.9 C) 98.4 F (36.9 C)  98 F (36.7 C)  TempSrc: Oral Oral    SpO2: 95% 94% 99% 94%  Weight:  Height:        General: Pt is alert, awake, not in acute distress Cardiovascular: S1/S2 +, no rubs, no gallops Respiratory: decreased breath sounds b/l  Abdominal: Soft, NT, obese, bowel sounds + Extremities: no edema, no cyanosis    The results of significant diagnostics from this hospitalization (including imaging, microbiology, ancillary and laboratory) are listed below for reference.     Microbiology: Recent Results (from the past 240 hours)  Resp panel by RT-PCR (RSV, Flu A&B, Covid) Anterior Nasal Swab     Status: None   Collection Time: 03/03/24  4:05 PM   Specimen: Anterior Nasal Swab  Result Value Ref Range Status   SARS Coronavirus 2 by RT PCR NEGATIVE NEGATIVE Final    Comment: (NOTE) SARS-CoV-2 target nucleic acids are NOT DETECTED.  The SARS-CoV-2 RNA is generally detectable in upper respiratory specimens during the acute phase of infection. The lowest concentration of SARS-CoV-2 viral copies this assay can detect is 138 copies/mL. A negative result does not preclude SARS-Cov-2 infection and should not be used as the sole basis for treatment or other patient management decisions. A negative result may occur with  improper specimen collection/handling, submission of specimen other than nasopharyngeal swab, presence of viral mutation(s) within the areas targeted by this assay, and inadequate number of viral copies(<138 copies/mL). A negative result must be combined with clinical observations, patient history, and  epidemiological information. The expected result is Negative.  Fact Sheet for Patients:  BloggerCourse.com  Fact Sheet for Healthcare Providers:  SeriousBroker.it  This test is no t yet approved or cleared by the [place]  FDA and  has been authorized for detection and/or diagnosis of SARS-CoV-2 by FDA under an Emergency Use Authorization (EUA). This EUA will remain  in effect (meaning this test can be used) for the duration of the COVID-19 declaration under Section 564(b)(1) of the Act, 21 U.S.C.section 360bbb-3(b)(1), unless the authorization is terminated  or revoked sooner.       Influenza A by PCR NEGATIVE NEGATIVE Final   Influenza B by PCR NEGATIVE NEGATIVE Final    Comment: (NOTE) The Xpert Xpress SARS-CoV-2/FLU/RSV plus assay is intended as an aid in the diagnosis of influenza from Nasopharyngeal swab specimens and should not be used as a sole basis for treatment. Nasal washings and aspirates are unacceptable for Xpert Xpress SARS-CoV-2/FLU/RSV testing.  Fact Sheet for Patients: BloggerCourse.com  Fact Sheet for Healthcare Providers: SeriousBroker.it  This test is not yet approved or cleared by the [place]  FDA and has been authorized for detection and/or diagnosis of SARS-CoV-2 by FDA under an Emergency Use Authorization (EUA). This EUA will remain in effect (meaning this test can be used) for the duration of the COVID-19 declaration under Section 564(b)(1) of the Act, 21 U.S.C. section 360bbb-3(b)(1), unless the authorization is terminated or revoked.     Resp Syncytial Virus by PCR NEGATIVE NEGATIVE Final    Comment: (NOTE) Fact Sheet for Patients: BloggerCourse.com  Fact Sheet for Healthcare Providers: SeriousBroker.it  This test is not yet approved or cleared by the [place]  FDA and has been  authorized for detection and/or diagnosis of SARS-CoV-2 by FDA under an Emergency Use Authorization (EUA). This EUA will remain in effect (meaning this test can be used) for the duration of the COVID-19 declaration under Section 564(b)(1) of the Act, 21 U.S.C. section 360bbb-3(b)(1), unless the authorization is terminated or revoked.  Performed at [facility], [address] [place], [place] [address]  Labs: BNP (last 3 results) Recent Labs    12/06/23 1243 12/27/23 0507 03/03/24 1604  BNP 12.3 18.2 34.2   Basic Metabolic Panel: Recent Labs  Lab 03/03/24 1604 03/04/24 0339 03/05/24 0335  NA 138 139 143  K 3.2* 4.2 3.4*  CL 98 99 104  CO2 29 28 29   GLUCOSE 172* 234* 167*  BUN 9 14 23*  CREATININE 0.88 1.22* 0.86  CALCIUM  8.5* 8.9 9.0  MG 2.1  --   --   PHOS 2.9  --   --    Liver Function Tests: No results for input(s): AST, ALT, ALKPHOS, BILITOT, PROT, ALBUMIN  in the last 168 hours. No results for input(s): LIPASE, AMYLASE in the last 168 hours. No results for input(s): AMMONIA in the last 168 hours. CBC: Recent Labs  Lab 03/03/24 1604 03/04/24 0339 03/05/24 0335  WBC 6.5 8.4 12.4*  NEUTROABS 3.7  --   --   HGB 10.7* 9.9* 9.0*  HCT 34.9* 32.3* 29.4*  MCV 82.9 81.4 81.9  PLT 242 238 244   Cardiac Enzymes: No results for input(s): CKTOTAL, CKMB, CKMBINDEX, TROPONINI in the last 168 hours. BNP: Invalid input(s): POCBNP CBG: Recent Labs  Lab 03/05/24 0834  GLUCAP 149*   D-Dimer Recent Labs    03/03/24 2223  DDIMER 2.46*   Hgb A1c No results for input(s): HGBA1C in the last 72 hours. Lipid Profile No results for input(s): CHOL, HDL, LDLCALC, TRIG, CHOLHDL, LDLDIRECT in the last 72 hours. Thyroid  function studies No results for input(s): TSH, T4TOTAL, T3FREE, THYROIDAB in the last 72 hours.  Invalid input(s): FREET3 Anemia work up No results for input(s): VITAMINB12,  FOLATE, FERRITIN, TIBC, IRON , RETICCTPCT in the last 72 hours. Urinalysis    Component Value Date/Time   COLORURINE STRAW (A) 12/27/2023 1136   APPEARANCEUR CLEAR (A) 12/27/2023 1136   APPEARANCEUR Clear 09/02/2014 2312   LABSPEC 1.006 12/27/2023 1136   LABSPEC 1.013 09/02/2014 2312   PHURINE 5.0 12/27/2023 1136   GLUCOSEU NEGATIVE 12/27/2023 1136   GLUCOSEU Negative 09/02/2014 2312   HGBUR NEGATIVE 12/27/2023 1136   BILIRUBINUR NEGATIVE 12/27/2023 1136   BILIRUBINUR Negative 09/02/2014 2312   KETONESUR NEGATIVE 12/27/2023 1136   PROTEINUR NEGATIVE 12/27/2023 1136   NITRITE NEGATIVE 12/27/2023 1136   LEUKOCYTESUR NEGATIVE 12/27/2023 1136   LEUKOCYTESUR Negative 09/02/2014 2312   Sepsis Labs Recent Labs  Lab 03/03/24 1604 03/04/24 0339 03/05/24 0335  WBC 6.5 8.4 12.4*   Microbiology Recent Results (from the past 240 hours)  Resp panel by RT-PCR (RSV, Flu A&B, Covid) Anterior Nasal Swab     Status: None   Collection Time: 03/03/24  4:05 PM   Specimen: Anterior Nasal Swab  Result Value Ref Range Status   SARS Coronavirus 2 by RT PCR NEGATIVE NEGATIVE Final    Comment: (NOTE) SARS-CoV-2 target nucleic acids are NOT DETECTED.  The SARS-CoV-2 RNA is generally detectable in upper respiratory specimens during the acute phase of infection. The lowest concentration of SARS-CoV-2 viral copies this assay can detect is 138 copies/mL. A negative result does not preclude SARS-Cov-2 infection and should not be used as the sole basis for treatment or other patient management decisions. A negative result may occur with  improper specimen collection/handling, submission of specimen other than nasopharyngeal swab, presence of viral mutation(s) within the areas targeted by this assay, and inadequate number of viral copies(<138 copies/mL). A negative result must be combined with clinical observations, patient history, and epidemiological information. The expected result is  Negative.  Fact Sheet for Patients:  BloggerCourse.com  Fact Sheet for Healthcare Providers:  SeriousBroker.it  This test is no t yet approved or cleared by the [place]  FDA and  has been authorized for detection and/or diagnosis of SARS-CoV-2 by FDA under an Emergency Use Authorization (EUA). This EUA will remain  in effect (meaning this test can be used) for the duration of the COVID-19 declaration under Section 564(b)(1) of the Act, 21 U.S.C.section 360bbb-3(b)(1), unless the authorization is terminated  or revoked sooner.       Influenza A by PCR NEGATIVE NEGATIVE Final   Influenza B by PCR NEGATIVE NEGATIVE Final    Comment: (NOTE) The Xpert Xpress SARS-CoV-2/FLU/RSV plus assay is intended as an aid in the diagnosis of influenza from Nasopharyngeal swab specimens and should not be used as a sole basis for treatment. Nasal washings and aspirates are unacceptable for Xpert Xpress SARS-CoV-2/FLU/RSV testing.  Fact Sheet for Patients: BloggerCourse.com  Fact Sheet for Healthcare Providers: SeriousBroker.it  This test is not yet approved or cleared by the [place]  FDA and has been authorized for detection and/or diagnosis of SARS-CoV-2 by FDA under an Emergency Use Authorization (EUA). This EUA will remain in effect (meaning this test can be used) for the duration of the COVID-19 declaration under Section 564(b)(1) of the Act, 21 U.S.C. section 360bbb-3(b)(1), unless the authorization is terminated or revoked.     Resp Syncytial Virus by PCR NEGATIVE NEGATIVE Final    Comment: (NOTE) Fact Sheet for Patients: BloggerCourse.com  Fact Sheet for Healthcare Providers: SeriousBroker.it  This test is not yet approved or cleared by the [place]  FDA and has been authorized for detection and/or diagnosis of  SARS-CoV-2 by FDA under an Emergency Use Authorization (EUA). This EUA will remain in effect (meaning this test can be used) for the duration of the COVID-19 declaration under Section 564(b)(1) of the Act, 21 U.S.C. section 360bbb-3(b)(1), unless the authorization is terminated or revoked.  Performed at [facility], [address] [place], [place] [address]      Time coordinating discharge: 33 minutes  SIGNED:   Anthony CHRISTELLA Pouch, MD  [facility] 03/05/2024, 1:17 PM Pager   If 7PM-7AM, please contact night-coverage [email or web address]

## 2024-03-05 NOTE — Progress Notes (Signed)
 ANTICOAGULATION CONSULT NOTE  Pharmacy Consult for heparin  infusion Indication: suspecting PE, pending V/Q scan, positive D-dimer   Allergies  Allergen Reactions   Contrast Media [Iodinated Contrast Media] Shortness Of Breath    Per CT report in 2014 pt had break through contrast reaction with hives and SOB following 13 hour prep. MSY    Ferumoxytol  Nausea Only   Iron  Sucrose Anaphylaxis   Lidocaine  Hives   Metrizamide Shortness Of Breath   Penicillins Hives and Other (See Comments)    Tolerated ceftriaxone  in past IgE = 4 (WNL) on 08/12/2022  ediate rash, facial/tongue/throat swelling, SOB or lightheadedness with hypotension, tachy   Sacubitril-Valsartan     Sz (unclear if new start entresto vs hypoglycemia)   Isosorbide Nitrate Other (See Comments)    Headache   Latex Hives    IgE < 0.10 (WNL) on 08/21/2023   Ondansetron  Other (See Comments)    Severe headache   Tizanidine  Other (See Comments)    Feels altered   Povidone-Iodine  Rash    Blistering rash   Pulmicort  [Budesonide ] Itching    Patient Measurements: Height: 5' 5 (165.1 cm) Weight: 90.7 kg (200 lb) IBW/kg (Calculated) : 57 HEPARIN  DW (KG): 77.1  Vital Signs: Temp: 98.4 F (36.9 C) (07/01 0235) Temp Source: Oral (07/01 0235) BP: 88/59 (07/01 0235) Pulse Rate: 77 (07/01 0235)  Labs: Recent Labs    03/03/24 1604 03/03/24 2223 03/04/24 0339 03/04/24 0759 03/04/24 1403 03/05/24 0335  HGB 10.7*  --  9.9*  --   --  9.0*  HCT 34.9*  --  32.3*  --   --  29.4*  PLT 242  --  238  --   --  244  APTT  --   --   --  52*  --   --   LABPROT  --   --   --  14.4  --   --   INR  --   --   --  1.1  --   --   HEPARINUNFRC  --   --   --  0.48 0.53 0.50  CREATININE 0.88  --  1.22*  --   --  0.86  TROPONINIHS 3 <2  --   --   --   --     Estimated Creatinine Clearance: 82.3 mL/min (by C-G formula based on SCr of 0.86 mg/dL).   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   a.) Bx 02/22/2018 --> NE tumor cells --> AE1/AE3, chromogranin, synaptophysin (+); Ki-67 proliferation rate <1%; b.) repeat Bx 08/24/2018 --> well differentiard NET (G1)   CHF (congestive heart failure) (HCC)    Chronic, continuous use of opioids    a.) has naloxone  Rx available   COPD (chronic obstructive pulmonary disease) (HCC)    Coronary artery disease    Depression    Diet-controlled type 2 diabetes mellitus (HCC)    Diverticulitis 2010   DVT (deep venous thrombosis) (HCC)    Endometriosis 1990   Generalized epilepsy (HCC)    a.) on lacosamide ; b.) seizure  07-30-22 in setting of hypokalemia (K+ 2.6); c.) last seizure 11/13/2023 during blood draw   GERD (gastroesophageal reflux disease)    GIB (gastrointestinal bleeding) 01/08/2018   H/O syncope    HFrEF (heart failure with reduced ejection fraction) (HCC)    a.)TTE 12/09/16: EF 25-30%, dif HK; b.) TTE 02/08/17: EF 25%, dif HK, mild TR, G1DD; c.) TTE 04/25/17: EF 30-35%, mild LVH; d.) TTE 01/30/18: EF 45%, MAC, LAE, mild MR, G1DD; e.) TTE 11/06/2018: EF 25%, sev glob HK, triv MR/TR/PR; f.) TTE 05/12/19: EF 35%, MAC, AoV sclerosis, G1DD; g.) TTE 11/05/19: EF 20%, sev glob HK, triv MR; h.) TTE 06/17/20: EF 40%, mod glob HK, triv MR; I:) TTE 06/14/22: EF 25-30%, G1DD   History of cardiac catheterization    a.) LHC 10/21/2014: normal coronaries; b.) R/LHC 02/24/2017: normal coronaries, mRA 12, mPA 28, mPCWP 15, CO 8.0, CI 3.8   History of kidney stones    Hx MRSA infection    Hypertension    Hypokalemia    Hypothyroidism    a.) s/p thyroidectomy   Incomplete left bundle branch  block (LBBB)    Iron  deficiency anemia 03/27/2018   Lump or mass in breast 11/27/2012   a.) Bx 11/27/2012 --> apocrine wall cyst   Migraine    Nausea & vomiting    Neuropathy    Non-ischemic cardiomyopathy (HCC)    a.) TTE 12/09/2016: EF 25-30%, b.) TTE 02/08/2017: EF 25%; c.) Medtronic AICD placed 03/29/2017; d.) TTE 04/25/2017: EF 30-35%; e.) TTE 01/30/2018: EF 45%; f.) TTE 11/06/2018: EF 25%; g.) TTE 05/12/2019: EF 35%; h.) TTE 11/05/2019: EF 20%; I.) TTE 06/17/2020: EF 40%; J.) TTE 06/14/2022: EF 25-30%   Obesity    OSA on CPAP    PAF (paroxysmal atrial fibrillation) (HCC)    Pneumonia    PONV (postoperative nausea and vomiting)    Post-COVID chronic cough    Postcholecystectomy diarrhea    Prolonged Q-T interval on ECG    RAD (reactive airway disease)    Restless leg    a.) on ropinirole    Sepsis (HCC)    Unstable angina (HCC)     Assessment: Pt is a 58 yo female w/ PMH of anxiety, CHF, COPD, depression, HTN, hypothyroidism, RLS, seizure d/o presenting to ED reporting SOB all day & states her defibrillator has fired twice today.  Pt given enoxaparin  45 mg 06/29 @ 2132.   Goal of Therapy:  Heparin  level 0.3-0.7 units/ml Monitor platelets by anticoagulation protocol: Yes   Plan:  7/1:  HL @ 0335 = 0.50, therapeutic X 3 - Will continue pt on current rate and recheck HL on 7/2 with AM labs. ---CBC daily while on heparin   Gervase Colberg D 03/05/2024 4:43 AM

## 2024-03-05 NOTE — Progress Notes (Signed)
 Discharge instructions were reviewed with patient. Questions were encouraged and answered. IV x2 were removed. Belongings collected by patient

## 2024-03-05 NOTE — Plan of Care (Signed)

## 2024-03-05 NOTE — TOC CM/SW Note (Signed)
 Transition of Care [facility]) - Inpatient Brief Assessment   Patient Details  Name: Brittany Nguyen MRN: [id number] Date of Birth: 10-08-1965  [facility] CM/SW Contact:    Lauraine JAYSON Carpen, LCSW Phone Number: 03/05/2024, 1:21 PM   Clinical Narrative: Patient has orders to discharge home today. Chart reviewed. No TOC needs identified. CSW signing off.  Transition of Care Asessment: Insurance and Status: Insurance coverage has been reviewed Patient has primary care physician: Yes Home environment has been reviewed: Single family home Prior level of function:: Not documented Prior/Current Home Services: No current home services Social Drivers of Health Review: SDOH reviewed no interventions necessary Readmission risk has been reviewed: Yes Transition of care needs: no transition of care needs at this time

## 2024-03-14 ENCOUNTER — Emergency Department

## 2024-03-14 ENCOUNTER — Emergency Department
Admission: EM | Admit: 2024-03-14 | Discharge: 2024-03-14 | Disposition: A | Discharge status: Home / Self Care | Attending: Emergency Medicine | Admitting: Emergency Medicine

## 2024-03-14 ENCOUNTER — Other Ambulatory Visit: Payer: Self-pay

## 2024-03-14 ENCOUNTER — Encounter: Payer: Self-pay | Admitting: Emergency Medicine

## 2024-03-14 DIAGNOSIS — D649 Anemia, unspecified: Secondary | ICD-10-CM | POA: Diagnosis not present

## 2024-03-14 DIAGNOSIS — E119 Type 2 diabetes mellitus without complications: Secondary | ICD-10-CM | POA: Diagnosis not present

## 2024-03-14 DIAGNOSIS — I509 Heart failure, unspecified: Secondary | ICD-10-CM | POA: Diagnosis not present

## 2024-03-14 DIAGNOSIS — J441 Chronic obstructive pulmonary disease with (acute) exacerbation: Secondary | ICD-10-CM | POA: Diagnosis not present

## 2024-03-14 DIAGNOSIS — R0602 Shortness of breath: Secondary | ICD-10-CM

## 2024-03-14 DIAGNOSIS — E876 Hypokalemia: Secondary | ICD-10-CM | POA: Insufficient documentation

## 2024-03-14 DIAGNOSIS — Z95810 Presence of automatic (implantable) cardiac defibrillator: Secondary | ICD-10-CM | POA: Diagnosis not present

## 2024-03-14 DIAGNOSIS — I2510 Atherosclerotic heart disease of native coronary artery without angina pectoris: Secondary | ICD-10-CM | POA: Diagnosis not present

## 2024-03-14 DIAGNOSIS — R072 Precordial pain: Secondary | ICD-10-CM | POA: Diagnosis not present

## 2024-03-14 DIAGNOSIS — R059 Cough, unspecified: Secondary | ICD-10-CM | POA: Diagnosis not present

## 2024-03-14 DIAGNOSIS — I110 Hypertensive heart disease with heart failure: Secondary | ICD-10-CM | POA: Diagnosis not present

## 2024-03-14 LAB — BASIC METABOLIC PANEL WITH GFR
Anion gap: 11 (ref 5–15)
BUN: 7 mg/dL (ref 6–20)
CO2: 32 mmol/L (ref 22–32)
Calcium: 8.1 mg/dL — ABNORMAL LOW (ref 8.9–10.3)
Chloride: 96 mmol/L — ABNORMAL LOW (ref 98–111)
Creatinine, Ser: 0.91 mg/dL (ref 0.44–1.00)
GFR, Estimated: >60 mL/min (ref >60)
Glucose, Bld: 167 mg/dL — ABNORMAL HIGH (ref 70–99)
Potassium: 3.3 mmol/L — ABNORMAL LOW (ref 3.5–5.1)
Sodium: 139 mmol/L (ref 135–145)

## 2024-03-14 LAB — BRAIN NATRIURETIC PEPTIDE: B Natriuretic Peptide: 34.2 pg/mL (ref 0.0–100.0)

## 2024-03-14 LAB — CBC
HCT: 31.6 % — ABNORMAL LOW (ref 36.0–46.0)
Hemoglobin: 9.5 g/dL — ABNORMAL LOW (ref 12.0–15.0)
MCH: 24.8 pg — ABNORMAL LOW (ref 26.0–34.0)
MCHC: 30.1 g/dL (ref 30.0–36.0)
MCV: 82.5 fL (ref 80.0–100.0)
Platelets: 267 K/uL (ref 150–400)
RBC: 3.83 MIL/uL — ABNORMAL LOW (ref 3.87–5.11)
RDW: 16.9 % — ABNORMAL HIGH (ref 11.5–15.5)
WBC: 8.4 K/uL (ref 4.0–10.5)
nRBC: 0 % (ref 0.0–0.2)

## 2024-03-14 LAB — TROPONIN I (HIGH SENSITIVITY)
Troponin I (High Sensitivity): 3 ng/L (ref <18)
Troponin I (High Sensitivity): 3 ng/L (ref <18)

## 2024-03-14 MED ORDER — PROMETHAZINE HCL 25 MG PO TABS
25.0000 mg | ORAL_TABLET | Freq: Once | ORAL | Status: AC
  Administered 2024-03-14: 25 mg via ORAL
  Filled 2024-03-14: qty 1

## 2024-03-14 MED ORDER — OXYCODONE-ACETAMINOPHEN 5-325 MG PO TABS
1.0000 | ORAL_TABLET | Freq: Once | ORAL | Status: AC
  Administered 2024-03-14: 1 via ORAL
  Filled 2024-03-14: qty 1

## 2024-03-14 MED ORDER — PREDNISONE 20 MG PO TABS: 40.0000 mg | ORAL_TABLET | Freq: Every day | ORAL | 0 refills | Status: AC

## 2024-03-14 MED ORDER — SODIUM CHLORIDE 0.9 % IV BOLUS
500.0000 mL | Freq: Once | INTRAVENOUS | Status: AC
  Administered 2024-03-14: 500 mL via INTRAVENOUS

## 2024-03-14 MED ORDER — IPRATROPIUM-ALBUTEROL 0.5-2.5 (3) MG/3ML IN SOLN
6.0000 mL | Freq: Once | RESPIRATORY_TRACT | Status: AC
  Administered 2024-03-14: 6 mL via RESPIRATORY_TRACT
  Filled 2024-03-14: qty 3

## 2024-03-14 MED ORDER — DOXYCYCLINE HYCLATE 100 MG PO TABS: 100.0000 mg | ORAL_TABLET | Freq: Two times a day (BID) | ORAL | 0 refills | Status: AC

## 2024-03-14 MED ORDER — POTASSIUM CHLORIDE CRYS ER 20 MEQ PO TBCR
40.0000 meq | EXTENDED_RELEASE_TABLET | Freq: Once | ORAL | Status: AC
  Administered 2024-03-14: 40 meq via ORAL
  Filled 2024-03-14: qty 2

## 2024-03-14 MED ORDER — METHYLPREDNISOLONE SODIUM SUCC 125 MG IJ SOLR
125.0000 mg | Freq: Once | INTRAMUSCULAR | Status: AC
  Administered 2024-03-14: 125 mg via INTRAVENOUS
  Filled 2024-03-14: qty 2

## 2024-03-14 NOTE — ED Provider Notes (Signed)
 [facility] Provider Note    Event Date/Time   First MD Initiated Contact with Patient 03/14/24 [phone]     (approximate)   History   Shortness of Breath   HPI  Brittany Nguyen is a 58 year old female with history of HTN, diabetes, CHF with a EF of 45%, COPD, CAD, GERD presenting to the ER for evaluation of cough and shortness of breath.  Patient reports he had similar symptoms when she was recently admitted, but has gotten worse over the past few days.  Does report decreased p.o. intake, has been taking her medications.  No reported fevers.  Additionally reports body aches that she says she takes Percocet for at home.  I reviewed her discharge summary from 03/05/2024.  At that time, she presented with shortness of breath thought to be related to COPD exacerbation with associated pleuritic chest pain.  She had an elevated D-dimer, but negative VQ scan and bilateral lower extremity ultrasound for DVT.      Physical Exam   Triage Vital Signs: ED Triage Vitals  Encounter Vitals Group     BP 03/14/24 0903 99/71     Girls Systolic BP Percentile --      Girls Diastolic BP Percentile --      Boys Systolic BP Percentile --      Boys Diastolic BP Percentile --      Pulse Rate 03/14/24 0903 89     Resp 03/14/24 0903 (!) 26     Temp 03/14/24 0903 98.7 F (37.1 C)     Temp Source 03/14/24 0903 Oral     SpO2 03/14/24 0903 95 %     Weight 03/14/24 0902 200 lb (90.7 kg)     Height 03/14/24 0902 5' 5 (1.651 m)     Head Circumference --      Peak Flow --      Pain Score 03/14/24 0901 8     Pain Loc --      Pain Education --      Exclude from Growth Chart --     Most recent vital signs: Vitals:   03/14/24 1100 03/14/24 1200  BP: 99/72 98/63  Pulse: 70 75  Resp: 18 20  Temp:    SpO2: 98% 96%     General: Awake, interactive  HEENT: Dry mucous membranes CV:  Regular rate, good peripheral perfusion.  Resp:  Unlabored respirations, lung sounds diminished  at bilateral bases without appreciable crackles Abd:  Nondistended, soft, nontender Neuro:  Symmetric facial movement, fluid speech   ED Results / Procedures / Treatments   Labs (all labs ordered are listed, but only abnormal results are displayed) Labs Reviewed  BASIC METABOLIC PANEL WITH GFR - Abnormal; Notable for the following components:      Result Value   Potassium 3.3 (*)    Chloride 96 (*)    Glucose, Bld 167 (*)    Calcium  8.1 (*)    All other components within normal limits  CBC - Abnormal; Notable for the following components:   RBC 3.83 (*)    Hemoglobin 9.5 (*)    HCT 31.6 (*)    MCH 24.8 (*)    RDW 16.9 (*)    All other components within normal limits  BRAIN NATRIURETIC PEPTIDE  TROPONIN I (HIGH SENSITIVITY)  TROPONIN I (HIGH SENSITIVITY)     EKG EKG independently reviewed and interpreted by myself demonstrates:  EKG demonstrates normal sinus rhythm rate of 84, PR 204, QRS 88,  QTc 482, no acute ST changes  RADIOLOGY Imaging independently reviewed and interpreted by myself demonstrates:  CXR without focal consolidation  Formal Radiology Read:  DG Chest 2 View Result Date: 03/14/2024 CLINICAL DATA:  Worsening midsternal chest pain since Thursday, short of breath, cough EXAM: CHEST - 2 VIEW COMPARISON:  03/03/2024 FINDINGS: Frontal and lateral views of the chest demonstrate stable dual lead pacer/AICD. Cardiac silhouette is unremarkable. No acute airspace disease, effusion, or pneumothorax. No acute bony abnormalities. IMPRESSION: 1. Stable chest, no acute process. Electronically Signed   By: Ozell Daring M.D.   On: 03/14/2024 09:32    PROCEDURES:  Critical Care performed: No  Procedures   MEDICATIONS ORDERED IN ED: Medications  potassium chloride  SA (KLOR-CON  M) CR tablet 40 mEq (has no administration in time range)  sodium chloride  0.9 % bolus 500 mL (0 mLs Intravenous Stopped 03/14/24 1223)  oxyCODONE -acetaminophen  (PERCOCET/ROXICET) 5-325 MG per  tablet 1 tablet (1 tablet Oral Given 03/14/24 1015)  methylPREDNISolone  sodium succinate (SOLU-MEDROL ) 125 mg/2 mL injection 125 mg (125 mg Intravenous Given 03/14/24 1016)  ipratropium-albuterol  (DUONEB) 0.5-2.5 (3) MG/3ML nebulizer solution 6 mL (6 mLs Nebulization Given 03/14/24 1017)  promethazine  (PHENERGAN ) tablet 25 mg (25 mg Oral Given 03/14/24 1152)     IMPRESSION / MDM / ASSESSMENT AND PLAN / ED COURSE  I reviewed the triage vital signs and the nursing notes.  Differential diagnosis includes, but is not limited to, COPD exacerbation, CHF exacerbation, hypovolemia in the setting of diuretic use with decreased p.o. intake, anemia, electrolyte abnormality, pneumonia, lower suspicion PE given with recent negative  VQ scan and lower extremity Dopplers  Patient's presentation is most consistent with acute presentation with potential threat to life or bodily function.  58 year old female presenting to the emergency department for evaluation of ongoing cough, shortness of breath, chest pain.  Recently admitted for similar.  Diminished breath sounds on exam.  Low normal blood pressure, satting appropriately on room air.  Clinically appears somewhat dry, will trial small fluid bolus as well as steroids, nebs.  Labs with normal white blood cell count, stable anemia.  BMP with mild hypokalemia at 3.3.  Negative initial troponin.  BNP pending.  Clinical Course as of 03/14/24 1225  Thu Mar 14, 2024  1218 Patient reassessed.  Feels improved.  Breathing comfortably on room air.  Continues to have mild cough.  Suspect likely COPD exacerbation.  Given worsening cough, will DC with prescription for prednisone , antibiotics.  Strict return precautions provided.  Patient discharged stable condition. [NR]    Clinical Course User Index [NR] Levander Slate, MD     FINAL CLINICAL IMPRESSION(S) / ED DIAGNOSES   Final diagnoses:  COPD exacerbation (HCC)  SOB (shortness of breath)     Rx / DC Orders   ED  Discharge Orders          Ordered    predniSONE  (DELTASONE ) 20 MG tablet  Daily with breakfast        03/14/24 1223    doxycycline  (VIBRA -TABS) 100 MG tablet  2 times daily        03/14/24 1223             Note:  This document was prepared using Dragon voice recognition software and may include unintentional dictation errors.   Levander Slate, MD 03/14/24 1225

## 2024-03-14 NOTE — ED Triage Notes (Signed)
 Pt via POV from home. Pt c/o SOB, cough, and mid-sternal CP that has been getting worse since Thursday. Reports that she was d/c from the hospital then, states that she never felt completely better. Reports increased swelling in her feet with hx of CHF. Pt is A&Ox4 and NAD, ambulatory to triage.

## 2024-03-14 NOTE — Discharge Instructions (Addendum)
 You were seen in the emergency room today for evaluation of your shortness of breath.  Your testing fortunately did not show an emergency cause for this.  I suspect your shortness of breath may be related to a COPD exacerbation.  I sent a prescription for a steroid course as well as antibiotics to your pharmacy.  Follow with her primary care doctor for further evaluation.  Return to the ER for new or worsening symptoms.

## 2024-03-18 DIAGNOSIS — I5042 Chronic combined systolic (congestive) and diastolic (congestive) heart failure: Secondary | ICD-10-CM | POA: Diagnosis not present

## 2024-03-18 DIAGNOSIS — Z4509 Encounter for adjustment and management of other cardiac device: Secondary | ICD-10-CM | POA: Diagnosis not present

## 2024-03-28 ENCOUNTER — Emergency Department

## 2024-03-28 ENCOUNTER — Inpatient Hospital Stay
Admission: EM | Admit: 2024-03-28 | Discharge: 2024-03-30 | DRG: 866 | Disposition: A | Discharge status: Home / Self Care | Attending: Internal Medicine | Admitting: Internal Medicine

## 2024-03-28 ENCOUNTER — Other Ambulatory Visit: Payer: Self-pay

## 2024-03-28 DIAGNOSIS — Z79890 Hormone replacement therapy (postmenopausal): Secondary | ICD-10-CM | POA: Diagnosis not present

## 2024-03-28 DIAGNOSIS — I5022 Chronic systolic (congestive) heart failure: Secondary | ICD-10-CM | POA: Diagnosis present

## 2024-03-28 DIAGNOSIS — D3A8 Other benign neuroendocrine tumors: Secondary | ICD-10-CM | POA: Diagnosis present

## 2024-03-28 DIAGNOSIS — Z803 Family history of malignant neoplasm of breast: Secondary | ICD-10-CM

## 2024-03-28 DIAGNOSIS — G8929 Other chronic pain: Secondary | ICD-10-CM | POA: Diagnosis present

## 2024-03-28 DIAGNOSIS — B349 Viral infection, unspecified: Principal | ICD-10-CM | POA: Diagnosis present

## 2024-03-28 DIAGNOSIS — Z9049 Acquired absence of other specified parts of digestive tract: Secondary | ICD-10-CM

## 2024-03-28 DIAGNOSIS — E890 Postprocedural hypothyroidism: Secondary | ICD-10-CM | POA: Diagnosis present

## 2024-03-28 DIAGNOSIS — Z79899 Other long term (current) drug therapy: Secondary | ICD-10-CM | POA: Diagnosis not present

## 2024-03-28 DIAGNOSIS — R059 Cough, unspecified: Secondary | ICD-10-CM | POA: Diagnosis not present

## 2024-03-28 DIAGNOSIS — R918 Other nonspecific abnormal finding of lung field: Secondary | ICD-10-CM | POA: Diagnosis not present

## 2024-03-28 DIAGNOSIS — Z808 Family history of malignant neoplasm of other organs or systems: Secondary | ICD-10-CM

## 2024-03-28 DIAGNOSIS — I4891 Unspecified atrial fibrillation: Secondary | ICD-10-CM | POA: Diagnosis present

## 2024-03-28 DIAGNOSIS — Z8249 Family history of ischemic heart disease and other diseases of the circulatory system: Secondary | ICD-10-CM

## 2024-03-28 DIAGNOSIS — E669 Obesity, unspecified: Secondary | ICD-10-CM | POA: Diagnosis present

## 2024-03-28 DIAGNOSIS — Z79891 Long term (current) use of opiate analgesic: Secondary | ICD-10-CM

## 2024-03-28 DIAGNOSIS — I2510 Atherosclerotic heart disease of native coronary artery without angina pectoris: Secondary | ICD-10-CM | POA: Diagnosis present

## 2024-03-28 DIAGNOSIS — G2581 Restless legs syndrome: Secondary | ICD-10-CM | POA: Diagnosis present

## 2024-03-28 DIAGNOSIS — Z8616 Personal history of COVID-19: Secondary | ICD-10-CM | POA: Diagnosis not present

## 2024-03-28 DIAGNOSIS — I480 Paroxysmal atrial fibrillation: Secondary | ICD-10-CM | POA: Diagnosis present

## 2024-03-28 DIAGNOSIS — I10 Essential (primary) hypertension: Secondary | ICD-10-CM | POA: Diagnosis present

## 2024-03-28 DIAGNOSIS — Z8041 Family history of malignant neoplasm of ovary: Secondary | ICD-10-CM

## 2024-03-28 DIAGNOSIS — Z853 Personal history of malignant neoplasm of breast: Secondary | ICD-10-CM

## 2024-03-28 DIAGNOSIS — E039 Hypothyroidism, unspecified: Secondary | ICD-10-CM | POA: Diagnosis not present

## 2024-03-28 DIAGNOSIS — Z1152 Encounter for screening for COVID-19: Secondary | ICD-10-CM

## 2024-03-28 DIAGNOSIS — R0602 Shortness of breath: Secondary | ICD-10-CM | POA: Diagnosis not present

## 2024-03-28 DIAGNOSIS — R109 Unspecified abdominal pain: Secondary | ICD-10-CM | POA: Diagnosis not present

## 2024-03-28 DIAGNOSIS — C7A8 Other malignant neuroendocrine tumors: Secondary | ICD-10-CM | POA: Diagnosis present

## 2024-03-28 DIAGNOSIS — C7A092 Malignant carcinoid tumor of the stomach: Secondary | ICD-10-CM

## 2024-03-28 DIAGNOSIS — R519 Headache, unspecified: Secondary | ICD-10-CM | POA: Diagnosis not present

## 2024-03-28 DIAGNOSIS — Z96653 Presence of artificial knee joint, bilateral: Secondary | ICD-10-CM | POA: Diagnosis present

## 2024-03-28 DIAGNOSIS — I252 Old myocardial infarction: Secondary | ICD-10-CM

## 2024-03-28 DIAGNOSIS — R6510 Systemic inflammatory response syndrome (SIRS) of non-infectious origin without acute organ dysfunction: Secondary | ICD-10-CM | POA: Diagnosis not present

## 2024-03-28 DIAGNOSIS — Z90710 Acquired absence of both cervix and uterus: Secondary | ICD-10-CM

## 2024-03-28 DIAGNOSIS — Z880 Allergy status to penicillin: Secondary | ICD-10-CM

## 2024-03-28 DIAGNOSIS — I428 Other cardiomyopathies: Secondary | ICD-10-CM | POA: Diagnosis present

## 2024-03-28 DIAGNOSIS — Z806 Family history of leukemia: Secondary | ICD-10-CM

## 2024-03-28 DIAGNOSIS — I110 Hypertensive heart disease with heart failure: Secondary | ICD-10-CM | POA: Diagnosis present

## 2024-03-28 DIAGNOSIS — D490 Neoplasm of unspecified behavior of digestive system: Secondary | ICD-10-CM | POA: Diagnosis present

## 2024-03-28 DIAGNOSIS — E876 Hypokalemia: Secondary | ICD-10-CM | POA: Diagnosis present

## 2024-03-28 DIAGNOSIS — R569 Unspecified convulsions: Secondary | ICD-10-CM | POA: Diagnosis not present

## 2024-03-28 DIAGNOSIS — Z95810 Presence of automatic (implantable) cardiac defibrillator: Secondary | ICD-10-CM | POA: Diagnosis not present

## 2024-03-28 DIAGNOSIS — Z888 Allergy status to other drugs, medicaments and biological substances status: Secondary | ICD-10-CM

## 2024-03-28 DIAGNOSIS — J4489 Other specified chronic obstructive pulmonary disease: Secondary | ICD-10-CM | POA: Diagnosis present

## 2024-03-28 DIAGNOSIS — Z884 Allergy status to anesthetic agent status: Secondary | ICD-10-CM

## 2024-03-28 DIAGNOSIS — G893 Neoplasm related pain (acute) (chronic): Secondary | ICD-10-CM | POA: Diagnosis not present

## 2024-03-28 DIAGNOSIS — G4733 Obstructive sleep apnea (adult) (pediatric): Secondary | ICD-10-CM | POA: Diagnosis present

## 2024-03-28 DIAGNOSIS — Z950 Presence of cardiac pacemaker: Secondary | ICD-10-CM | POA: Diagnosis not present

## 2024-03-28 DIAGNOSIS — K219 Gastro-esophageal reflux disease without esophagitis: Secondary | ICD-10-CM | POA: Diagnosis present

## 2024-03-28 DIAGNOSIS — G40909 Epilepsy, unspecified, not intractable, without status epilepticus: Secondary | ICD-10-CM

## 2024-03-28 DIAGNOSIS — E1140 Type 2 diabetes mellitus with diabetic neuropathy, unspecified: Secondary | ICD-10-CM | POA: Diagnosis present

## 2024-03-28 DIAGNOSIS — Z91040 Latex allergy status: Secondary | ICD-10-CM

## 2024-03-28 DIAGNOSIS — Z87442 Personal history of urinary calculi: Secondary | ICD-10-CM

## 2024-03-28 DIAGNOSIS — Z8614 Personal history of Methicillin resistant Staphylococcus aureus infection: Secondary | ICD-10-CM

## 2024-03-28 DIAGNOSIS — R531 Weakness: Secondary | ICD-10-CM | POA: Diagnosis not present

## 2024-03-28 DIAGNOSIS — D72829 Elevated white blood cell count, unspecified: Secondary | ICD-10-CM | POA: Diagnosis not present

## 2024-03-28 DIAGNOSIS — R300 Dysuria: Secondary | ICD-10-CM | POA: Diagnosis not present

## 2024-03-28 DIAGNOSIS — Z6830 Body mass index (BMI) 30.0-30.9, adult: Secondary | ICD-10-CM | POA: Diagnosis not present

## 2024-03-28 DIAGNOSIS — Z8679 Personal history of other diseases of the circulatory system: Secondary | ICD-10-CM | POA: Diagnosis present

## 2024-03-28 DIAGNOSIS — R160 Hepatomegaly, not elsewhere classified: Secondary | ICD-10-CM | POA: Diagnosis not present

## 2024-03-28 DIAGNOSIS — Z91041 Radiographic dye allergy status: Secondary | ICD-10-CM

## 2024-03-28 DIAGNOSIS — R509 Fever, unspecified: Secondary | ICD-10-CM | POA: Diagnosis not present

## 2024-03-28 LAB — CBC WITH DIFFERENTIAL/PLATELET
Abs Immature Granulocytes: 0.17 K/uL — ABNORMAL HIGH (ref 0.00–0.07)
Basophils Absolute: 0.1 K/uL (ref 0.0–0.1)
Basophils Relative: 1 %
Eosinophils Absolute: 0.4 K/uL (ref 0.0–0.5)
Eosinophils Relative: 3 %
HCT: 36.6 % (ref 36.0–46.0)
Hemoglobin: 11.5 g/dL — ABNORMAL LOW (ref 12.0–15.0)
Immature Granulocytes: 1 %
Lymphocytes Relative: 18 %
Lymphs Abs: 2.7 K/uL (ref 0.7–4.0)
MCH: 24.9 pg — ABNORMAL LOW (ref 26.0–34.0)
MCHC: 31.4 g/dL (ref 30.0–36.0)
MCV: 79.2 fL — ABNORMAL LOW (ref 80.0–100.0)
Monocytes Absolute: 0.9 K/uL (ref 0.1–1.0)
Monocytes Relative: 6 %
Neutro Abs: 10.7 K/uL — ABNORMAL HIGH (ref 1.7–7.7)
Neutrophils Relative %: 71 %
Platelets: 387 K/uL (ref 150–400)
RBC: 4.62 MIL/uL (ref 3.87–5.11)
RDW: 17.6 % — ABNORMAL HIGH (ref 11.5–15.5)
WBC: 15 K/uL — ABNORMAL HIGH (ref 4.0–10.5)
nRBC: 0 % (ref 0.0–0.2)

## 2024-03-28 LAB — LACTIC ACID, PLASMA
Lactic Acid, Venous: 2.6 mmol/L (ref 0.5–1.9)
Lactic Acid, Venous: 2.6 mmol/L (ref 0.5–1.9)
Lactic Acid, Venous: 3.1 mmol/L (ref 0.5–1.9)
Lactic Acid, Venous: 2.8 mmol/L (ref 0.5–1.9)

## 2024-03-28 LAB — RESP PANEL BY RT-PCR (RSV, FLU A&B, COVID)  RVPGX2
Influenza A by PCR: NEGATIVE
Influenza B by PCR: NEGATIVE
Resp Syncytial Virus by PCR: NEGATIVE
SARS Coronavirus 2 by RT PCR: NEGATIVE

## 2024-03-28 LAB — COMPREHENSIVE METABOLIC PANEL WITH GFR
ALT: 19 U/L (ref 0–44)
AST: 30 U/L (ref 15–41)
Albumin: 4.1 g/dL (ref 3.5–5.0)
Alkaline Phosphatase: 92 U/L (ref 38–126)
Anion gap: 17 — ABNORMAL HIGH (ref 5–15)
BUN: 7 mg/dL (ref 6–20)
CO2: 30 mmol/L (ref 22–32)
Calcium: 8.3 mg/dL — ABNORMAL LOW (ref 8.9–10.3)
Chloride: 90 mmol/L — ABNORMAL LOW (ref 98–111)
Creatinine, Ser: 1.04 mg/dL — ABNORMAL HIGH (ref 0.44–1.00)
GFR, Estimated: >60 mL/min (ref >60)
Glucose, Bld: 143 mg/dL — ABNORMAL HIGH (ref 70–99)
Potassium: 2.4 mmol/L — CL (ref 3.5–5.1)
Sodium: 137 mmol/L (ref 135–145)
Total Bilirubin: 0.5 mg/dL (ref 0.0–1.2)
Total Protein: 7.8 g/dL (ref 6.5–8.1)

## 2024-03-28 LAB — GLUCOSE, CAPILLARY
Glucose-Capillary: 141 mg/dL — ABNORMAL HIGH (ref 70–99)
Glucose-Capillary: 163 mg/dL — ABNORMAL HIGH (ref 70–99)

## 2024-03-28 LAB — MAGNESIUM: Magnesium: 1.8 mg/dL (ref 1.7–2.4)

## 2024-03-28 LAB — TROPONIN I (HIGH SENSITIVITY): Troponin I (High Sensitivity): 7 ng/L (ref <18)

## 2024-03-28 LAB — POTASSIUM: Potassium: 2.9 mmol/L — ABNORMAL LOW (ref 3.5–5.1)

## 2024-03-28 LAB — BRAIN NATRIURETIC PEPTIDE: B Natriuretic Peptide: 18.2 pg/mL (ref 0.0–100.0)

## 2024-03-28 LAB — PROCALCITONIN: Procalcitonin: <0.1 ng/mL

## 2024-03-28 MED ORDER — VANCOMYCIN HCL 2000 MG/400ML IV SOLN
2000.0000 mg | Freq: Once | INTRAVENOUS | Status: DC
  Filled 2024-03-28: qty 400

## 2024-03-28 MED ORDER — LACTATED RINGERS IV BOLUS (SEPSIS)
1000.0000 mL | Freq: Once | INTRAVENOUS | Status: AC
  Administered 2024-03-28: 1000 mL via INTRAVENOUS

## 2024-03-28 MED ORDER — SODIUM CHLORIDE 0.9 % IV SOLN
12.5000 mg | Freq: Four times a day (QID) | INTRAVENOUS | Status: AC | PRN
  Administered 2024-03-28 – 2024-03-29 (×3): 12.5 mg via INTRAVENOUS
  Filled 2024-03-28 (×3): qty 12.5

## 2024-03-28 MED ORDER — OXYCODONE HCL 5 MG PO TABS
5.0000 mg | ORAL_TABLET | ORAL | Status: DC | PRN
  Administered 2024-03-29 (×3): 5 mg via ORAL
  Filled 2024-03-28 (×3): qty 1

## 2024-03-28 MED ORDER — HEPARIN SODIUM (PORCINE) 5000 UNIT/ML IJ SOLN
5000.0000 [IU] | Freq: Three times a day (TID) | INTRAMUSCULAR | Status: DC
  Administered 2024-03-29 – 2024-03-30 (×4): 5000 [IU] via SUBCUTANEOUS
  Filled 2024-03-28 (×4): qty 1

## 2024-03-28 MED ORDER — VANCOMYCIN HCL 1500 MG/300ML IV SOLN
1500.0000 mg | INTRAVENOUS | Status: DC
  Filled 2024-03-28: qty 300

## 2024-03-28 MED ORDER — INSULIN ASPART 100 UNIT/ML IJ SOLN
0.0000 [IU] | Freq: Every day | INTRAMUSCULAR | Status: DC
  Administered 2024-03-29: 4 [IU] via SUBCUTANEOUS
  Filled 2024-03-28: qty 1

## 2024-03-28 MED ORDER — OXYCODONE-ACETAMINOPHEN 5-325 MG PO TABS
1.0000 | ORAL_TABLET | ORAL | Status: DC | PRN
  Administered 2024-03-28 – 2024-03-30 (×9): 1 via ORAL
  Filled 2024-03-28 (×9): qty 1

## 2024-03-28 MED ORDER — ONDANSETRON HCL 4 MG/2ML IJ SOLN
4.0000 mg | Freq: Four times a day (QID) | INTRAMUSCULAR | Status: DC | PRN
  Filled 2024-03-28: qty 2

## 2024-03-28 MED ORDER — POTASSIUM CITRATE-CITRIC ACID 1100-334 MG/5ML PO SOLN
20.0000 meq | Freq: Once | ORAL | Status: AC
  Administered 2024-03-28: 20 meq via ORAL
  Filled 2024-03-28: qty 10

## 2024-03-28 MED ORDER — ROPINIROLE HCL 1 MG PO TABS
4.0000 mg | ORAL_TABLET | Freq: Every day | ORAL | Status: DC
  Administered 2024-03-28 – 2024-03-29 (×2): 4 mg via ORAL
  Filled 2024-03-28 (×2): qty 4

## 2024-03-28 MED ORDER — ACETAMINOPHEN 650 MG RE SUPP: 650.0000 mg | Freq: Four times a day (QID) | RECTAL | Status: DC | PRN

## 2024-03-28 MED ORDER — POTASSIUM CHLORIDE CRYS ER 20 MEQ PO TBCR
40.0000 meq | EXTENDED_RELEASE_TABLET | Freq: Once | ORAL | Status: AC
  Administered 2024-03-28: 40 meq via ORAL
  Filled 2024-03-28: qty 2

## 2024-03-28 MED ORDER — ACETAMINOPHEN 325 MG PO TABS
650.0000 mg | ORAL_TABLET | Freq: Four times a day (QID) | ORAL | Status: DC | PRN
  Administered 2024-03-28: 650 mg via ORAL
  Filled 2024-03-28: qty 2

## 2024-03-28 MED ORDER — LACTATED RINGERS IV SOLN
150.0000 mL/h | INTRAVENOUS | Status: DC
  Administered 2024-03-28: 150 mL/h via INTRAVENOUS

## 2024-03-28 MED ORDER — LEVOTHYROXINE SODIUM 100 MCG PO TABS
200.0000 ug | ORAL_TABLET | Freq: Every day | ORAL | Status: DC
  Administered 2024-03-29 – 2024-03-30 (×2): 200 ug via ORAL
  Filled 2024-03-28 (×2): qty 2

## 2024-03-28 MED ORDER — LORAZEPAM 1 MG PO TABS
1.0000 mg | ORAL_TABLET | Freq: Four times a day (QID) | ORAL | Status: DC | PRN
  Administered 2024-03-28 – 2024-03-29 (×2): 1 mg via ORAL
  Filled 2024-03-28 (×2): qty 1

## 2024-03-28 MED ORDER — INSULIN ASPART 100 UNIT/ML IJ SOLN
0.0000 [IU] | Freq: Three times a day (TID) | INTRAMUSCULAR | Status: DC
  Administered 2024-03-28 – 2024-03-29 (×2): 2 [IU] via SUBCUTANEOUS
  Administered 2024-03-29: 3 [IU] via SUBCUTANEOUS
  Administered 2024-03-29: 8 [IU] via SUBCUTANEOUS
  Administered 2024-03-30: 3 [IU] via SUBCUTANEOUS
  Administered 2024-03-30: 5 [IU] via SUBCUTANEOUS
  Filled 2024-03-28 (×6): qty 1

## 2024-03-28 MED ORDER — ONDANSETRON HCL 4 MG PO TABS: 4.0000 mg | ORAL_TABLET | Freq: Four times a day (QID) | ORAL | Status: DC | PRN

## 2024-03-28 MED ORDER — HYDRALAZINE HCL 20 MG/ML IJ SOLN: 5.0000 mg | Freq: Four times a day (QID) | INTRAMUSCULAR | Status: DC | PRN

## 2024-03-28 MED ORDER — OXYCODONE-ACETAMINOPHEN 10-325 MG PO TABS: 1.0000 | ORAL_TABLET | ORAL | Status: DC | PRN

## 2024-03-28 MED ORDER — METRONIDAZOLE 500 MG/100ML IV SOLN
500.0000 mg | Freq: Two times a day (BID) | INTRAVENOUS | Status: DC
  Administered 2024-03-29 (×2): 500 mg via INTRAVENOUS
  Filled 2024-03-28 (×3): qty 100

## 2024-03-28 MED ORDER — GABAPENTIN 300 MG PO CAPS
300.0000 mg | ORAL_CAPSULE | Freq: Three times a day (TID) | ORAL | Status: DC
  Administered 2024-03-28 – 2024-03-30 (×6): 300 mg via ORAL
  Filled 2024-03-28 (×6): qty 1

## 2024-03-28 MED ORDER — SODIUM CHLORIDE 0.9 % IV SOLN
2.0000 g | Freq: Once | INTRAVENOUS | Status: AC
  Administered 2024-03-28: 2 g via INTRAVENOUS
  Filled 2024-03-28: qty 12.5

## 2024-03-28 MED ORDER — SODIUM CHLORIDE 0.9 % IV SOLN
2.0000 g | Freq: Three times a day (TID) | INTRAVENOUS | Status: DC
  Administered 2024-03-28 – 2024-03-29 (×2): 2 g via INTRAVENOUS
  Filled 2024-03-28 (×3): qty 12.5

## 2024-03-28 MED ORDER — LACOSAMIDE 50 MG PO TABS
200.0000 mg | ORAL_TABLET | Freq: Two times a day (BID) | ORAL | Status: DC
Start: 2024-03-28 — End: 2024-03-30
  Administered 2024-03-28 – 2024-03-30 (×4): 200 mg via ORAL
  Filled 2024-03-28 (×4): qty 4

## 2024-03-28 MED ORDER — HYDROMORPHONE HCL 1 MG/ML IJ SOLN
0.5000 mg | Freq: Once | INTRAMUSCULAR | Status: AC
  Administered 2024-03-28: 0.5 mg via INTRAVENOUS
  Filled 2024-03-28: qty 0.5

## 2024-03-28 MED ORDER — POTASSIUM CHLORIDE 10 MEQ/100ML IV SOLN
10.0000 meq | INTRAVENOUS | Status: AC
  Administered 2024-03-28 (×2): 10 meq via INTRAVENOUS
  Filled 2024-03-28 (×2): qty 100

## 2024-03-28 MED ORDER — IPRATROPIUM-ALBUTEROL 0.5-2.5 (3) MG/3ML IN SOLN
3.0000 mL | Freq: Four times a day (QID) | RESPIRATORY_TRACT | Status: DC | PRN
  Administered 2024-03-29 (×2): 3 mL via RESPIRATORY_TRACT
  Filled 2024-03-28 (×2): qty 3

## 2024-03-28 MED ORDER — SODIUM CHLORIDE 0.9 % IV BOLUS
1000.0000 mL | Freq: Once | INTRAVENOUS | Status: AC
  Administered 2024-03-28: 1000 mL via INTRAVENOUS

## 2024-03-28 NOTE — Consult Note (Signed)
 Pharmacy Antibiotic Note  Brittany Nguyen is a 58 y.o. female admitted on 03/28/2024 with sepsis.  Pharmacy has been consulted for vancomycin  and cefepime  dosing.  Plan: Cefepime  2gm IV q 8hrs (dc consult). Vancomycin  2000 mg IV x 1 now, then Vancomycin  1500 mg IV Q 24 hrs Goal AUC 400-550. Expected AUC: 467 Excepted Cmin: 10.9 SCr used: 1.04 3.  Will follow up renal function and cultures to adjust therapy as needed.  Height: 5' 5 (165.1 cm) Weight: 90 kg (198 lb 6.6 oz) IBW/kg (Calculated) : 57  Temp (24hrs), Avg:99.2 F (37.3 C), Min:99.2 F (37.3 C), Max:99.2 F (37.3 C)  Recent Labs  Lab 03/28/24 0849 03/28/24 1026  WBC 15.0*  --   CREATININE 1.04*  --   LATICACIDVEN  --  2.6*    Estimated Creatinine Clearance: 66.9 mL/min (A) (by C-G formula based on SCr of 1.04 mg/dL (H)).    Allergies  Allergen Reactions   Contrast Media [Iodinated Contrast Media] Shortness Of Breath    Per CT report in 2014 pt had break through contrast reaction with hives and SOB following 13 hour prep. MSY    Ferumoxytol  Nausea Only   Iron  Sucrose Anaphylaxis   Lidocaine  Hives   Metrizamide Shortness Of Breath   Penicillins Hives and Other (See Comments)    Tolerated ceftriaxone  in past IgE = 4 (WNL) on 08/12/2022  ediate rash, facial/tongue/throat swelling, SOB or lightheadedness with hypotension, tachy   Sacubitril-Valsartan     Sz (unclear if new start entresto vs hypoglycemia)   Isosorbide Nitrate Other (See Comments)    Headache   Latex Hives    IgE < 0.10 (WNL) on 08/21/2023   Tizanidine  Other (See Comments)    Feels altered   Povidone-Iodine  Rash    Blistering rash   Pulmicort  [Budesonide ] Itching    Antimicrobials this admission: Vancomycin  7/24 >>  Cefepime  7/24 >>   Dose adjustments this admission: n/a  Microbiology results: 7/24 BCx: ordered by MD 7/24: resp panel : negative   Jahnae Mcadoo Rodriguez-Guzman PharmD, BCPS 03/28/2024 1:22 PM

## 2024-03-28 NOTE — Assessment & Plan Note (Signed)
 Home ropinirole 4 mg nightly resumed

## 2024-03-28 NOTE — Assessment & Plan Note (Signed)
 CPAP nightly ordered

## 2024-03-28 NOTE — Assessment & Plan Note (Signed)
 Home glucosamine 200 mg p.o. twice daily, gabapentin  300 mg 3 times daily, Ativan  4 times daily as needed for anxiety, seizures resumed

## 2024-03-28 NOTE — Assessment & Plan Note (Signed)
 Etiology workup in progress  UA has been ordered and pending collection Chest x-ray negative for x-ray evidence of acute cardiopulmonary process, CT abdomen pelvis without contrast: Read as mild hepatomegaly and status post sigmoid colectomy. Blood cultures x 2 are in process Maintain MAP > 65, patient is not presenting with septic shock at this time Status post sodium chloride  1 L bolus On admission have ordered LR 1 L bolus with LR infusion at 150 mL/h Vancomycin  and cefepime  per pharmacy, metronidazole  500 mg IV twice daily initiated on admission

## 2024-03-28 NOTE — Assessment & Plan Note (Signed)
 PDMP reviewed Current active prescription for lorazepam  1 mg tablet, 60 tablet for 30 days written on 03/22/2024; oxycodone -acetaminophen  10-325 mg 180 quantity for 30-day supply written on 02/15/2024; methadone  10 mg, 105 quantity for 30 days written on 02/15/2024

## 2024-03-28 NOTE — H&P (Addendum)
 History and Physical   Brittany Nguyen Kavan FMW:[id number] DOB: April 23, 1966 DOA: 03/28/2024  PCP: Valora Agent, MD  Outpatient Specialists: Dr. Elouise, [facility] Patient coming from: home  I have personally briefly reviewed patient's old medical records in [facility] EMR.  Chief Concern: Fever, sick yeah  HPI: Ms. Brittany Nguyen is a 58 year old female with history of COPD, glucose intolerance, hypertension, CAD, GERD, who presents to the emergency department for chief concerns of sick for a few days.  Vitals in the ED showed T of 99.2, rr 17, heart rate 83, blood pressure 105/74, SpO2 97% on room air.  Serum sodium is 137, potassium 2.4, chloride 90, bicarb 30, BUN of 7, serum creatinine 1.04, EGFR greater than 60, nonfasting blood glucose 143, WBC 15, hemoglobin 11.5, platelet 387.  Serum magnesium  levels 1.8.  Lactic acid elevated at 2.6.  COVID/influenza A/influenza B/RSV PCR were negative.  ED treatment: Dilaudid  0.5 mg IV one-time dose, potassium chloride  10 mEq IV, 2 doses were ordered, sodium chloride  1 L bolus. ---------------------------------------- At bedside, patient able to tell me her first and last name, age, location, current calendar year.  Patient reports she has not been feeling well for the past few days.  She reports last night her fever was very high and went up to 104F.  Patient took acetaminophen  and it improved to 101 last night.  She woke up this morning and did not feel well this prompted her to come to the ED.  She endorses nausea and denies vomiting.  She endorses dry cough that is new.  She endorses chills and dysuria.  Social history: She lives at home with her husband.  She denies tobacco, EtOH, recreational drug use.  ROS: Constitutional: no weight change, no fever ENT/Mouth: no sore throat, no rhinorrhea Eyes: no eye pain, no vision changes Cardiovascular: no chest pain, no dyspnea,  no edema, no palpitations Respiratory: no cough,  no sputum, no wheezing Gastrointestinal: no nausea, no vomiting, no diarrhea, no constipation Genitourinary: no urinary incontinence, no dysuria, no hematuria Musculoskeletal: no arthralgias, no myalgias Skin: no skin lesions, no pruritus, Neuro: + weakness, no loss of consciousness, no syncope Psych: no anxiety, no depression, + decrease appetite Heme/Lymph: no bruising, no bleeding  ED Course: Discussed with EDP, patient requiring hospitalization for chief concerns of fever, leukocytosis meeting SIRS.  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 Blood cultures x 2 are in process Maintain MAP > 65, patient is not presenting with septic shock at this time Status post sodium chloride  1 L bolus On admission have ordered LR 1 L bolus with LR infusion at 150 mL/h Vancomycin  and cefepime  per pharmacy, metronidazole  500 mg IV twice daily initiated on admission  Obesity (BMI 30-39.9) This complicates overall care and prognosis.   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 twice daily, gabapentin  300 mg 3 times daily, Ativan  4 times daily as needed for anxiety, seizures resumed  Hypothyroidism Home levothyroxine  200 mcg daily before breakfast resumed  RLS (restless legs syndrome) Home ropinirole  4 mg nightly resumed  Dysuria UA ordered and pending collection  Chart reviewed.   DVT prophylaxis: Heparin  5000 units subcutaneous every 8 hours Code Status: Full code Diet: Heart healthy Family Communication: A phone call was offered, patient declined stating that her husband knows she is being admitted to the hospital Disposition Plan: pending clinical course Consults called: Pharmacy Admission status: telemetry medical, inpatient   Past Medical History:  Diagnosis Date   Acute anterolateral wall MI (HCC) 08/2016   Acute respiratory failure (HCC)    AICD (automatic cardioverter/defibrillator) present 03/29/2017   a.) Medtronic Evera MRI XT DR SureScan (SN: [id number])   Anxiety    a.) on BZO PRN (lorazepam )   Arthritis    Asthma 2013   Breast cancer, right (HCC)    C. 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   a.) Bx 02/22/2018 --> NE tumor cells --> AE1/AE3, chromogranin, synaptophysin (+); Ki-67 proliferation rate <1%; b.) repeat Bx 08/24/2018 --> well differentiard NET (G1)   CHF (congestive heart failure) (HCC)    Chronic, continuous use of opioids    a.) has naloxone  Rx available   COPD (chronic obstructive pulmonary disease) (HCC)    Coronary artery disease    Depression    Diet-controlled type 2 diabetes mellitus  (HCC)    Diverticulitis 2010   DVT (deep venous thrombosis) (HCC)    Endometriosis 1990   Generalized epilepsy (HCC)    a.) on lacosamide ; b.) seizure 07-30-22 in setting of hypokalemia (K+ 2.6); c.) last seizure 11/13/2023 during blood draw   GERD (gastroesophageal reflux disease)    GIB (gastrointestinal bleeding) 01/08/2018   H/O syncope    HFrEF (heart failure with reduced ejection fraction) (HCC)    a.)TTE 12/09/16: EF 25-30%, dif HK; b.) TTE 02/08/17: EF 25%, dif HK, mild TR, G1DD; c.) TTE 04/25/17: EF 30-35%, mild LVH; d.) TTE 01/30/18: EF 45%, MAC, LAE, mild MR, G1DD; e.) TTE 11/06/2018: EF 25%, sev glob HK, triv MR/TR/PR; f.) TTE 05/12/19: EF 35%, MAC, AoV sclerosis, G1DD; g.) TTE 11/05/19: EF 20%, sev glob HK, triv MR; h.) TTE 06/17/20: EF 40%, mod glob HK, triv MR; I:) TTE 06/14/22: EF 25-30%, G1DD   History of cardiac catheterization    a.) LHC 10/21/2014: normal coronaries; b.) R/LHC 02/24/2017: normal coronaries, mRA 12, mPA 28, mPCWP 15, CO 8.0, CI 3.8   History of kidney stones    Hx MRSA infection    Hypertension    Hypokalemia    Hypothyroidism    a.) s/p thyroidectomy   Incomplete left bundle branch block (LBBB)    Iron  deficiency anemia 03/27/2018   Lump or mass in breast 11/27/2012   a.) Bx 11/27/2012 --> apocrine wall cyst   Migraine  Nausea & vomiting    Neuropathy    Non-ischemic cardiomyopathy (HCC)    a.) TTE 12/09/2016: EF 25-30%, b.) TTE 02/08/2017: EF 25%; c.) Medtronic AICD placed 03/29/2017; d.) TTE 04/25/2017: EF 30-35%; e.) TTE 01/30/2018: EF 45%; f.) TTE 11/06/2018: EF 25%; g.) TTE 05/12/2019: EF 35%; h.) TTE 11/05/2019: EF 20%; I.) TTE 06/17/2020: EF 40%; J.) TTE 06/14/2022: EF 25-30%   Obesity    OSA on CPAP    PAF (paroxysmal atrial fibrillation) (HCC)    Pneumonia    PONV (postoperative nausea and vomiting)    Post-COVID chronic cough    Postcholecystectomy diarrhea    Prolonged Q-T interval on ECG    RAD (reactive airway disease)    Restless leg     a.) on ropinirole    Sepsis (HCC)    Unstable angina (HCC)    Past Surgical History:  Procedure Laterality Date   ABDOMINAL HYSTERECTOMY     age 53   BREAST BIOPSY Right 2014   benign   BREAST BIOPSY Right 01/18/2023   Us  Core Bx, coil clip - path pending   BREAST BIOPSY Right 01/18/2023   US  RT BREAST BX W LOC DEV 1ST LESION IMG BX SPEC US  GUIDE 01/18/2023 [facility]   CARDIAC DEFIBRILLATOR PLACEMENT Left 03/29/2017   Procedure: MEDTRONIC CARDIAC DEFIBRILLATOR PLACEMENT (AICD); Location: [facility]; Surgeon: Lane Beard, MD   CESAREAN SECTION     CHOLECYSTECTOMY     COLECTOMY     COLONOSCOPY WITH PROPOFOL  N/A 02/22/2018   Procedure: COLONOSCOPY WITH PROPOFOL ;  Surgeon: Toledo, Ladell POUR, MD;  Location: [facility];  Service: Gastroenterology;  Laterality: N/A;   ESOPHAGOGASTRODUODENOSCOPY (EGD) WITH PROPOFOL  N/A 02/22/2018   Procedure: ESOPHAGOGASTRODUODENOSCOPY (EGD) WITH PROPOFOL ;  Surgeon: Toledo, Ladell POUR, MD;  Location: [facility];  Service: Gastroenterology;  Laterality: N/A;   IVC FILTER INSERTION N/A 08/23/2022   Procedure: IVC FILTER INSERTION;  Surgeon: Jama Cordella MATSU, MD;  Location: [facility];  Service: Cardiovascular;  Laterality: N/A;   IVC FILTER REMOVAL N/A 03/07/2023   Procedure: IVC FILTER REMOVAL;  Surgeon: Jama Cordella MATSU, MD;  Location: [facility];  Service: Cardiovascular;  Laterality: N/A;   KNEE ARTHROPLASTY Left 08/24/2022   Procedure: COMPUTER ASSISTED TOTAL KNEE ARTHROPLASTY;  Surgeon: Mardee Lynwood SQUIBB, MD;  Location: [facility];  Service: Orthopedics;  Laterality: Left;   KNEE ARTHROPLASTY Right 12/01/2023   Procedure: COMPUTER ASSISTED TOTAL KNEE ARTHROPLASTY;  Surgeon: Mardee Lynwood SQUIBB, MD;  Location: [facility];  Service: Orthopedics;  Laterality: Right;   LEFT HEART CATH AND CORONARY ANGIOGRAPHY Left 10/21/2014   Procedure: LEFT HEART CATH AND CORONARY ANGIOGRAPHY; Location: [facility]; Surgeon: Wolm Rhyme, MD   NASAL SINUS  SURGERY  2012   OOPHORECTOMY     RIGHT/LEFT HEART CATH AND CORONARY ANGIOGRAPHY Bilateral 02/24/2017   Procedure: RIGHT/LEFT HEART CATH AND CORONARY ANGIOGRAPHY; Location: [facility]   THYROIDECTOMY     Social History:  reports that she has never smoked. She has never used smokeless tobacco. She reports that she does not drink alcohol and does not use drugs.  Allergies  Allergen Reactions   Contrast Media [Iodinated Contrast Media] Shortness Of Breath    Per CT report in 2014 pt had break through contrast reaction with hives and SOB following 13 hour prep. MSY    Ferumoxytol  Nausea Only   Iron  Sucrose Anaphylaxis   Lidocaine  Hives   Metrizamide Shortness Of Breath   Penicillins Hives and Other (See Comments)    Tolerated ceftriaxone  in past IgE =  4 (WNL) on 08/12/2022  ediate rash, facial/tongue/throat swelling, SOB or lightheadedness with hypotension, tachy   Sacubitril-Valsartan     Sz (unclear if new start entresto vs hypoglycemia)   Isosorbide Nitrate Other (See Comments)    Headache   Latex Hives    IgE < 0.10 (WNL) on 08/21/2023   Tizanidine  Other (See Comments)    Feels altered   Povidone-Iodine  Rash    Blistering rash   Pulmicort  [Budesonide ] Itching   Family History  Problem Relation Age of Onset   Breast cancer Mother    Cancer Mother 21       ovarian   CAD Mother    Cancer Father 21       brain   CAD Father    Cancer Daughter 35       skin   Breast cancer Maternal Aunt    Cancer Maternal Aunt 48       breast   Leukemia Paternal Grandfather    Breast cancer Cousin    Family history: Family history reviewed and not pertinent.  Prior to Admission medications   Medication Sig Start Date End Date Taking? Authorizing Provider  gabapentin  (NEURONTIN ) 300 MG capsule Take 1 capsule (300 mg total) by mouth 3 (three) times daily. 10/08/23   Ezenduka, Nkeiruka J, MD  Lacosamide  100 MG TABS Take 200 mg by mouth 2 (two) times daily.    [provider]   levothyroxine  (SYNTHROID ) 200 MCG tablet Take 200 mcg by mouth daily before breakfast.    [provider]  LORazepam  (ATIVAN ) 1 MG tablet Take 1 tablet (1 mg total) by mouth 4 (four) times daily as needed. 11/10/21   Amin, Sumayya, MD  losartan  (COZAAR ) 25 MG tablet Take 12.5 mg by mouth 2 (two) times daily. 12/08/23   [provider]  methadone  (DOLOPHINE ) 10 MG tablet Take 10-20 mg by mouth 3 (three) times daily. 10 MG BID and 20 mg HS    [provider]  metoprolol  succinate (TOPROL -XL) 50 MG 24 hr tablet Take 50 mg by mouth 2 (two) times daily. 12/08/23   [provider]  oxyCODONE -acetaminophen  (PERCOCET) 10-325 MG tablet Take 1 tablet by mouth every 4 (four) hours as needed for pain.    [provider]  Potassium Chloride  ER 20 MEQ TBCR Take 20 mEq by mouth 2 (two) times daily. When taking torsemide  12/08/23   [provider]  predniSONE  (DELTASONE ) 10 MG tablet 40mg  daily x 2 days, 30mg  daily x 2 days, 20mg  daily x 2 days, 10mg  daily x 2days then stop 03/05/24   Trudy Anthony HERO, MD  promethazine  (PHENERGAN ) 25 MG tablet Take 25 mg by mouth every 4 (four) hours as needed for nausea or vomiting.    [provider]  rOPINIRole  (REQUIP ) 4 MG tablet Take 4 mg by mouth at bedtime. 01/30/19   [provider]  spironolactone  (ALDACTONE ) 50 MG tablet Take 50 mg by mouth 2 (two) times daily.    [provider]  tiZANidine  (ZANAFLEX ) 4 MG capsule Take 4 mg by mouth at bedtime.    [provider]  torsemide  (DEMADEX ) 20 MG tablet Take 20 mg by mouth 2 (two) times daily as needed (fluid).    [provider]   Physical Exam: Vitals:   03/28/24 0851 03/28/24 0907 03/28/24 1000  BP:  109/77 105/74  Pulse:  88 87  Resp:  20 17  Temp:  99.2 F (37.3 C)   TempSrc:  Oral  SpO2:  96% 97%  Weight: 90 kg    Height: 5' 5 (1.651 m)     Constitutional: appears older than stated age, weak Eyes: PERRL, lids and  conjunctivae normal ENMT: Mucous membranes are moist. Posterior pharynx clear of any exudate or lesions. Age-appropriate dentition. Hearing appropriate Neck: normal, supple, no masses, no thyromegaly Respiratory: clear to auscultation bilaterally, no wheezing, no crackles. Normal respiratory effort. No accessory muscle use.  Cardiovascular: Regular rate and rhythm, no murmurs / rubs / gallops. No extremity edema. 2+ pedal pulses. No carotid bruits.  Abdomen: Obese abdomen, no tenderness, no masses palpated, no hepatosplenomegaly. Bowel sounds positive.  Musculoskeletal: no clubbing / cyanosis. No joint deformity upper and lower extremities. Good ROM, no contractures, no atrophy. Normal muscle tone.  Skin: no rashes, lesions, ulcers. No induration Neurologic: Sensation intact. Strength 5/5 in all 4.  Psychiatric: Normal judgment and insight. Alert and oriented x 3. Normal mood.   EKG: independently reviewed, showing sinus rhythm with rate of 79, QTc 543  Chest x-ray on Admission: I personally reviewed and I agree with radiologist reading as below.  CT ABDOMEN PELVIS WO CONTRAST Result Date: 03/28/2024 CLINICAL DATA:  Abdominal pain, acute, nonlocalized EXAM: CT ABDOMEN AND PELVIS WITHOUT CONTRAST TECHNIQUE: Multidetector CT imaging of the abdomen and pelvis was performed following the standard protocol without IV contrast. RADIATION DOSE REDUCTION: This exam was performed according to the departmental dose-optimization program which includes automated exposure control, adjustment of the mA and/or kV according to patient size and/or use of iterative reconstruction technique. COMPARISON:  CT of the abdomen and pelvis dated December 15, 2021. FINDINGS: Lower chest: There are few tiny reticulonodular opacities present in the posterior periphery of the right lower lobe. A cardiac pacer lead is present. Hepatobiliary: Status post cholecystectomy. The liver is mildly prominent, measuring approximately 18 cm in  the midclavicular line. No masses are present and there is no biliary ductal dilatation. Pancreas: Normal. Spleen: Normal. Adrenals/Urinary Tract: Adrenal glands are unremarkable. Kidneys are normal, without renal calculi, focal lesion, or hydronephrosis. Bladder is unremarkable. Stomach/Bowel: The appendix is not identified. The patient is status post sigmoid colectomy. The bowel is otherwise unremarkable. Vascular/Lymphatic: The abdominal aorta is normal in caliber and unremarkable in appearance. An IVC filter is present. Reproductive: Status post hysterectomy and bilateral salpingo oophorectomy. Other: No free fluid or lymphadenopathy. Musculoskeletal: No fractures or osseous lesions present. IMPRESSION: 1. Mild hepatomegaly. 2. Status post sigmoid colectomy. Electronically Signed   By: Evalene Coho M.D.   On: 03/28/2024 11:31   CT HEAD WO CONTRAST ( ) Result Date: 03/28/2024 CLINICAL DATA:  Headache, new onset (Age >= 51y) EXAM: CT HEAD WITHOUT CONTRAST TECHNIQUE: Contiguous axial images were obtained from the base of the skull through the vertex without intravenous contrast. RADIATION DOSE REDUCTION: This exam was performed according to the departmental dose-optimization program which includes automated exposure control, adjustment of the mA and/or kV according to patient size and/or use of iterative reconstruction technique. COMPARISON:  CT the head dated December 27, 2023. FINDINGS: Brain: The brain appears normal. There is no evidence of hemorrhage, mass, cortical infarct or hydrocephalus. Vascular: Negative. Skull: Intact and unremarkable. Sinuses/Orbits: Mild mucosal disease within the frontal and sphenoid sinuses. The patient is status post bilateral sinonasal surgery. The orbits are unremarkable. Other: None. IMPRESSION: 1. Normal brain. 2. Mild paranasal sinus disease status post surgery. Electronically Signed   By: Evalene Coho M.D.   On: 03/28/2024 11:23   DG Chest 2 View Result Date:  03/28/2024 CLINICAL DATA:  Cough EXAM: CHEST - 2 VIEW COMPARISON:  None Available. FINDINGS: LEFT-sided pacer overlies normal cardiac silhouette. No effusion, infiltrate, pneumothorax. Degenerative osteophytosis of the spine. IMPRESSION: No acute cardiopulmonary process. Electronically Signed   By: Jackquline Boxer M.D.   On: 03/28/2024 11:05   Labs on Admission: I have personally reviewed following labs  CBC: Recent Labs  Lab 03/28/24 0849  WBC 15.0*  NEUTROABS 10.7*  HGB 11.5*  HCT 36.6  MCV 79.2*  PLT 387   Basic Metabolic Panel: Recent Labs  Lab 03/28/24 0849  NA 137  K 2.4*  CL 90*  CO2 30  GLUCOSE 143*  BUN 7  CREATININE 1.04*  CALCIUM  8.3*  MG 1.8   GFR: Estimated Creatinine Clearance: 66.9 mL/min (A) (by C-G formula based on SCr of 1.04 mg/dL (H)).  Liver Function Tests: Recent Labs  Lab 03/28/24 0849  AST 30  ALT 19  ALKPHOS 92  BILITOT 0.5  PROT 7.8  ALBUMIN  4.1   Urine analysis:    Component Value Date/Time   COLORURINE STRAW (A) 12/27/2023 1136   APPEARANCEUR CLEAR (A) 12/27/2023 1136   APPEARANCEUR Clear 09/02/2014 2312   LABSPEC 1.006 12/27/2023 1136   LABSPEC 1.013 09/02/2014 2312   PHURINE 5.0 12/27/2023 1136   GLUCOSEU NEGATIVE 12/27/2023 1136   GLUCOSEU Negative 09/02/2014 2312   HGBUR NEGATIVE 12/27/2023 1136   BILIRUBINUR NEGATIVE 12/27/2023 1136   BILIRUBINUR Negative 09/02/2014 2312   KETONESUR NEGATIVE 12/27/2023 1136   PROTEINUR NEGATIVE 12/27/2023 1136   NITRITE NEGATIVE 12/27/2023 1136   LEUKOCYTESUR NEGATIVE 12/27/2023 1136   LEUKOCYTESUR Negative 09/02/2014 2312   CRITICAL CARE Performed by: Dr. Sherre  Total critical care time: 32 minutes  Critical care time was exclusive of separately billable procedures and treating other patients.  Critical care was necessary to treat or prevent imminent or life-threatening deterioration.  Critical care was time spent personally by me on the following activities: development of  treatment plan with patient as well as nursing, discussions with consultants, evaluation of patient's response to treatment, examination of patient, obtaining history from patient or surrogate, ordering and performing treatments and interventions, ordering and review of laboratory studies, ordering and review of radiographic studies, pulse oximetry and re-evaluation of patient's condition.  This document was prepared using Dragon Voice Recognition software and may include unintentional dictation errors.  Dr. Sherre [facility]  If 7PM-7AM, please contact overnight-coverage provider If 7AM-7PM, please contact day attending provider [email or web address]  03/28/2024, 2:48 PM

## 2024-03-28 NOTE — Assessment & Plan Note (Signed)
 -  This complicates overall care and prognosis.

## 2024-03-28 NOTE — Assessment & Plan Note (Signed)
 Currently low normotensive, therefore home scheduled antihypertensive medication not resumed on admission Hydralazine  5 mg IV every 6 hours as needed for SBP >175, 5 days ordered

## 2024-03-28 NOTE — ED Notes (Addendum)
 Critical Result: K 2.4 Ernest, MD aware

## 2024-03-28 NOTE — Assessment & Plan Note (Signed)
 UA ordered and pending collection

## 2024-03-28 NOTE — ED Triage Notes (Signed)
 Pt states she has been sick for the past few days. NAD noted. Pt endorses high heart rate, normal HR in triage, afebrile in triage.

## 2024-03-28 NOTE — Plan of Care (Signed)
  Problem: Fluid Volume: Goal: Hemodynamic stability will improve Outcome: Progressing   Problem: Clinical Measurements: Goal: Diagnostic test results will improve Outcome: Progressing Goal: Signs and symptoms of infection will decrease Outcome: Progressing   Problem: Respiratory: Goal: Ability to maintain adequate ventilation will improve Outcome: Progressing   Problem: Education: Goal: Ability to describe self-care measures that may prevent or decrease complications (Diabetes Survival Skills Education) will improve Outcome: Progressing Goal: Individualized Educational Video(s) Outcome: Progressing   Problem: Health Behavior/Discharge Planning: Goal: Ability to identify and utilize available resources and services will improve Outcome: Progressing

## 2024-03-28 NOTE — Hospital Course (Signed)
 Ms. Brittany Nguyen is a 58 year old female with history of COPD, glucose intolerance, hypertension, CAD, GERD, who presents to the emergency department for chief concerns of sick for a few days.  Vitals in the ED showed T of 99.2, rr 17, heart rate 83, blood pressure 105/74, SpO2 97% on room air.  Serum sodium is 137, potassium 2.4, chloride 90, bicarb 30, BUN of 7, serum creatinine 1.04, EGFR greater than 60, nonfasting blood glucose 143, WBC 15, hemoglobin 11.5, platelet 387.  Serum magnesium  levels 1.8.  Lactic acid elevated at 2.6.  COVID/influenza A/influenza B/RSV PCR were negative.  ED treatment: Dilaudid  0.5 mg IV one-time dose, potassium chloride  10 mEq IV, 2 doses were ordered, sodium chloride  1 L bolus.

## 2024-03-28 NOTE — ED Provider Notes (Signed)
 [facility] Provider Note    Event Date/Time   First MD Initiated Contact with Patient 03/28/24 [phone]     (approximate)   History   Fever   HPI  Brittany Nguyen is a 58 y.o. female with history of COPD who comes in with concern for not feeling well over the past few days.  Patient reports that she has a history of stomach cancer.  Patient has a history on review of records from [facility] of malignant carcinoid tumor of the stomach, nonischemic cardiomyopathy, seizure.  Patient just reports increasing weakness for the past couple of days with fevers, cough, headache, abdominal pain just not feeling her normal self.  She reports a history of sepsis before.  Denies any urinary symptoms.  Did not take any medications prior to coming in.   Physical Exam   Triage Vital Signs: ED Triage Vitals  Encounter Vitals Group     BP 03/28/24 0907 109/77     Girls Systolic BP Percentile --      Girls Diastolic BP Percentile --      Boys Systolic BP Percentile --      Boys Diastolic BP Percentile --      Pulse Rate 03/28/24 0907 88     Resp 03/28/24 0907 20     Temp 03/28/24 0907 99.2 F (37.3 C)     Temp Source 03/28/24 0907 Oral     SpO2 03/28/24 0907 96 %     Weight 03/28/24 0851 198 lb 6.6 oz (90 kg)     Height 03/28/24 0851 5' 5 (1.651 m)     Head Circumference --      Peak Flow --      Pain Score 03/28/24 0850 8     Pain Loc --      Pain Education --      Exclude from Growth Chart --     Most recent vital signs: Vitals:   03/28/24 0907  BP: 109/77  Pulse: 88  Resp: 20  Temp: 99.2 F (37.3 C)  SpO2: 96%     General: Awake, no distress.  CV:  Good peripheral perfusion.  Resp:  Normal effort.  Abd:  No distention.  Other:  Full range of motion of neck.  Patient appears tired but is able to answer questions moving all extremities.  Some tenderness in her abdomen. No swelling in legs.  No calf tenderness  ED Results / Procedures /  Treatments   Labs (all labs ordered are listed, but only abnormal results are displayed) Labs Reviewed  CBC WITH DIFFERENTIAL/PLATELET - Abnormal; Notable for the following components:      Result Value   WBC 15.0 (*)    Hemoglobin 11.5 (*)    MCV 79.2 (*)    MCH 24.9 (*)    RDW 17.6 (*)    Neutro Abs 10.7 (*)    Abs Immature Granulocytes 0.17 (*)    All other components within normal limits  RESP PANEL BY RT-PCR (RSV, FLU A&B, COVID)  [id number]  COMPREHENSIVE METABOLIC PANEL WITH GFR     EKG  My interpretation of EKG:  Sinus rate of 79 without any ST elevation or T wave versions, QTc of 543  RADIOLOGY I have reviewed the xray personally and interpreted pacemaker noted.   PROCEDURES:  Critical Care performed: No  .1-3 Lead EKG Interpretation  Performed by: Ernest Ronal BRAVO, MD Authorized by: Ernest Ronal BRAVO, MD     Interpretation: normal  ECG rate assessment: normal     Rhythm: sinus rhythm     Ectopy: none     Conduction: normal      MEDICATIONS ORDERED IN ED: Medications  potassium chloride  10 mEq in 100 mL IVPB (10 mEq Intravenous New Bag/Given 03/28/24 1111)  HYDROmorphone  (DILAUDID ) injection 0.5 mg (0.5 mg Intravenous Given 03/28/24 1027)  sodium chloride  0.9 % bolus 1,000 mL (1,000 mLs Intravenous New Bag/Given 03/28/24 1026)     IMPRESSION / MDM / ASSESSMENT AND PLAN / ED COURSE  I reviewed the triage vital signs and the nursing notes.   Patient's presentation is most consistent with acute presentation with potential threat to life or bodily function.   Patient comes in with weakness, reported fever at home although no fever reducers this morning.  Will get workup to evaluate for sepsis, bacteremia.  Will get CT scan of her head given she reports a headache as well as her abdomen and pelvis without contrast given contrast allergy.  Patient was just worked up for PE last month without evidence of acute PE and she is not hypoxic so seems less likely pulmonary  embolism    CBC shows elevated white count.  CMP shows low potassium at 2.4.  Slight elevation of anion gap.  COVID, flu are negative.  Unclear source of patient's reported fever.  She has been afebrile here.  Continues to look very weak and low potassium.  States that she still feels very weak.  Will discuss hospital team for admission given patient is a cancer patient on chemo for blood culture rule out will hold off antibiotics given does not meet sepsis criteria at this time but they could be reasonable to monitor for 24 hours while correcting potassium levels.  Patient does have prolonged QTc.  The patient is on the cardiac monitor to evaluate for evidence of arrhythmia and/or significant heart rate changes.      FINAL CLINICAL IMPRESSION(S) / ED DIAGNOSES   Final diagnoses:  Leukocytosis, unspecified type  Hypokalemia     Rx / DC Orders   ED Discharge Orders     None        Note:  This document was prepared using Dragon voice recognition software and may include unintentional dictation errors.   Ernest Ronal BRAVO, MD 03/28/24 [phone]

## 2024-03-28 NOTE — Assessment & Plan Note (Signed)
 Home levothyroxine  200 mcg daily before breakfast resumed

## 2024-03-29 ENCOUNTER — Inpatient Hospital Stay

## 2024-03-29 DIAGNOSIS — R6510 Systemic inflammatory response syndrome (SIRS) of non-infectious origin without acute organ dysfunction: Secondary | ICD-10-CM

## 2024-03-29 DIAGNOSIS — G893 Neoplasm related pain (acute) (chronic): Secondary | ICD-10-CM | POA: Diagnosis not present

## 2024-03-29 DIAGNOSIS — E669 Obesity, unspecified: Secondary | ICD-10-CM | POA: Diagnosis not present

## 2024-03-29 DIAGNOSIS — C7A092 Malignant carcinoid tumor of the stomach: Secondary | ICD-10-CM | POA: Diagnosis not present

## 2024-03-29 LAB — RESPIRATORY PANEL BY PCR

## 2024-03-29 LAB — BASIC METABOLIC PANEL WITH GFR
Anion gap: 11 (ref 5–15)
BUN: 14 mg/dL (ref 6–20)
CO2: 28 mmol/L (ref 22–32)
Calcium: 7.7 mg/dL — ABNORMAL LOW (ref 8.9–10.3)
Chloride: 100 mmol/L (ref 98–111)
Creatinine, Ser: 1.11 mg/dL — ABNORMAL HIGH (ref 0.44–1.00)
GFR, Estimated: 58 mL/min — ABNORMAL LOW (ref >60)
Glucose, Bld: 156 mg/dL — ABNORMAL HIGH (ref 70–99)
Potassium: 3.2 mmol/L — ABNORMAL LOW (ref 3.5–5.1)
Sodium: 139 mmol/L (ref 135–145)

## 2024-03-29 LAB — CBC
HCT: 29.7 % — ABNORMAL LOW (ref 36.0–46.0)
Hemoglobin: 9.3 g/dL — ABNORMAL LOW (ref 12.0–15.0)
MCH: 24.7 pg — ABNORMAL LOW (ref 26.0–34.0)
MCHC: 31.3 g/dL (ref 30.0–36.0)
MCV: 78.8 fL — ABNORMAL LOW (ref 80.0–100.0)
Platelets: 256 K/uL (ref 150–400)
RBC: 3.77 MIL/uL — ABNORMAL LOW (ref 3.87–5.11)
RDW: 18 % — ABNORMAL HIGH (ref 11.5–15.5)
WBC: 6.6 K/uL (ref 4.0–10.5)
nRBC: 0 % (ref 0.0–0.2)

## 2024-03-29 LAB — GLUCOSE, CAPILLARY
Glucose-Capillary: 131 mg/dL — ABNORMAL HIGH (ref 70–99)
Glucose-Capillary: 159 mg/dL — ABNORMAL HIGH (ref 70–99)
Glucose-Capillary: 272 mg/dL — ABNORMAL HIGH (ref 70–99)

## 2024-03-29 LAB — CORTISOL-AM, BLOOD: Cortisol - AM: 19.5 ug/dL (ref 6.7–22.6)

## 2024-03-29 MED ORDER — SODIUM CHLORIDE 0.9 % IV SOLN
12.5000 mg | Freq: Four times a day (QID) | INTRAVENOUS | Status: DC | PRN
  Administered 2024-03-29 – 2024-03-30 (×2): 12.5 mg via INTRAVENOUS
  Filled 2024-03-29 (×2): qty 12.5
  Filled 2024-03-29 (×2): qty 0.5

## 2024-03-29 MED ORDER — FUROSEMIDE 10 MG/ML IJ SOLN
40.0000 mg | Freq: Once | INTRAMUSCULAR | Status: AC
  Administered 2024-03-29: 40 mg via INTRAVENOUS
  Filled 2024-03-29: qty 4

## 2024-03-29 MED ORDER — GUAIFENESIN ER 600 MG PO TB12
600.0000 mg | ORAL_TABLET | Freq: Two times a day (BID) | ORAL | Status: DC
  Administered 2024-03-29 – 2024-03-30 (×3): 600 mg via ORAL
  Filled 2024-03-29 (×3): qty 1

## 2024-03-29 MED ORDER — POTASSIUM CHLORIDE CRYS ER 20 MEQ PO TBCR
40.0000 meq | EXTENDED_RELEASE_TABLET | Freq: Once | ORAL | Status: AC
  Administered 2024-03-29: 40 meq via ORAL
  Filled 2024-03-29: qty 2

## 2024-03-29 MED ORDER — IPRATROPIUM-ALBUTEROL 0.5-2.5 (3) MG/3ML IN SOLN: 3.0000 mL | Freq: Two times a day (BID) | RESPIRATORY_TRACT | Status: DC

## 2024-03-29 MED ORDER — BENZONATATE 100 MG PO CAPS
200.0000 mg | ORAL_CAPSULE | Freq: Three times a day (TID) | ORAL | Status: DC
  Administered 2024-03-29 – 2024-03-30 (×4): 200 mg via ORAL
  Filled 2024-03-29 (×4): qty 2

## 2024-03-29 MED ORDER — IPRATROPIUM-ALBUTEROL 0.5-2.5 (3) MG/3ML IN SOLN
3.0000 mL | Freq: Four times a day (QID) | RESPIRATORY_TRACT | Status: DC
  Filled 2024-03-29: qty 3

## 2024-03-29 MED ORDER — MORPHINE SULFATE (PF) 2 MG/ML IV SOLN
2.0000 mg | INTRAVENOUS | Status: DC | PRN
  Administered 2024-03-29 – 2024-03-30 (×5): 2 mg via INTRAVENOUS
  Filled 2024-03-29 (×5): qty 1

## 2024-03-29 MED ORDER — METHYLPREDNISOLONE SODIUM SUCC 40 MG IJ SOLR
40.0000 mg | Freq: Two times a day (BID) | INTRAMUSCULAR | Status: DC
  Administered 2024-03-29 – 2024-03-30 (×3): 40 mg via INTRAVENOUS
  Filled 2024-03-29 (×3): qty 1

## 2024-03-29 MED ORDER — VANCOMYCIN HCL 2000 MG/400ML IV SOLN
2000.0000 mg | Freq: Once | INTRAVENOUS | Status: DC
  Administered 2024-03-29: 2000 mg via INTRAVENOUS
  Filled 2024-03-29: qty 400

## 2024-03-29 MED ORDER — VANCOMYCIN HCL 1500 MG/300ML IV SOLN: 1500.0000 mg | INTRAVENOUS | Status: DC

## 2024-03-29 NOTE — Plan of Care (Signed)
  Problem: Fluid Volume: Goal: Hemodynamic stability will improve Outcome: Progressing   Problem: Clinical Measurements: Goal: Diagnostic test results will improve Outcome: Progressing Goal: Signs and symptoms of infection will decrease Outcome: Progressing   Problem: Respiratory: Goal: Ability to maintain adequate ventilation will improve Outcome: Progressing   Problem: Education: Goal: Ability to describe self-care measures that may prevent or decrease complications (Diabetes Survival Skills Education) will improve Outcome: Progressing Goal: Individualized Educational Video(s) Outcome: Progressing

## 2024-03-29 NOTE — Care Management Important Message (Signed)
 Important Message  Patient Details  Name: Brittany Nguyen MRN: [id number] Date of Birth: 1966/04/21   Important Message Given:  Yes - Medicare IM     [name] 03/29/2024, 1:22 PM

## 2024-03-29 NOTE — Progress Notes (Signed)
 PROGRESS NOTE    Brittany Nguyen  FMW:[id number] DOB: [birth year] DOA: 03/28/2024 PCP: Valora Agent, MD    Brief Narrative:   58 year old female with history of COPD, glucose intolerance, hypertension, CAD, GERD, who presents to the emergency department for chief concerns of sick for a few days.   Patient reports she has not been feeling well for the past few days.  She reports last night her fever was very high and went up to 104F.  Patient took acetaminophen  and it improved to 101 last night.  She woke up this morning and did not feel well this prompted her to come to the ED.   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  COVID/flu/RSV negative.  Patient still markedly symptomatic.  Short of breath.  Despite not requiring oxygen  she is coughing. 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 twice daily, gabapentin  300 mg 3 times daily, Ativan  4 times daily as needed for anxiety, seizures resumed   Hypothyroidism Home levothyroxine  200 mcg daily before breakfast resumed   RLS (restless legs syndrome) Home ropinirole  4 mg nightly resumed   Dysuria UA ordered and pending   DVT prophylaxis: SQ heparin  Code Status: Full Family Communication: None Disposition Plan: Status is: Inpatient Remains inpatient appropriate because: Suspected viral syndrome.  Cough, subjective fever, shortness of breath, dyspnea on exertion.  Anticipate discharge 7/26.   Level of care: Telemetry Medical  Consultants:  None  Procedures:  None  Antimicrobials: None   Subjective: Seen and examined.  Endorses cough, chills, shortness of breath.  Endorses weakness, fatigue, muscle pain  Objective: Vitals:   03/28/24 1518 03/28/24 2057 03/29/24 0509 03/29/24 0851  BP: 104/78 92/69 103/75 100/72  Pulse: 79 88 86 81  Resp: 16 16 16 18   Temp: 98.4 F (36.9 C) 98 F (36.7 C) 98.7 F (37.1 C) 98.2 F (36.8 C)  TempSrc: Oral  Oral   SpO2: 100% 96% 93% 96%  Weight:      Height:        Intake/Output Summary (Last 24 hours) at 03/29/2024  1410 Last data filed at 03/29/2024 0900 Gross per 24 hour  Intake 1860.06 ml  Output --  Net 1860.06 ml   Filed Weights   03/28/24 0851  Weight: 90 kg    Examination:  General exam: Fatigued Respiratory system: Scattered fine crackles bilaterally.  Normal work of breathing.  Room air Cardiovascular system: S1-S2, RRR, no murmurs, no pedal  edema Gastrointestinal system: Obese, soft, NT/ND, normal bowel sounds Central nervous system: Alert and oriented. No focal neurological deficits. Extremities: Symmetric 5 x 5 power. Skin: No rashes, lesions or ulcers Psychiatry: Judgement and insight appear normal. Mood & affect appropriate.     Data Reviewed: I have personally reviewed following labs and imaging studies  CBC: Recent Labs  Lab 03/28/24 0849 03/29/24 0443  WBC 15.0* 6.6  NEUTROABS 10.7*  --   HGB 11.5* 9.3*  HCT 36.6 29.7*  MCV 79.2* 78.8*  PLT 387 256   Basic Metabolic Panel: Recent Labs  Lab 03/28/24 0849 03/28/24 1640 03/29/24 0443  NA 137  --  139  K 2.4* 2.9* 3.2*  CL 90*  --  100  CO2 30  --  28  GLUCOSE 143*  --  156*  BUN 7  --  14  CREATININE 1.04*  --  1.11*  CALCIUM  8.3*  --  7.7*  MG 1.8  --   --    GFR: Estimated Creatinine Clearance: 62.7 mL/min (A) (by C-G formula based on SCr of 1.11 mg/dL (H)). Liver Function Tests: Recent Labs  Lab 03/28/24 0849  AST 30  ALT 19  ALKPHOS 92  BILITOT 0.5  PROT 7.8  ALBUMIN  4.1   No results for input(s): LIPASE, AMYLASE in the last 168 hours. No results for input(s): AMMONIA in the last 168 hours. Coagulation Profile: No results for input(s): INR, PROTIME in the last 168 hours. Cardiac Enzymes: No results for input(s): CKTOTAL, CKMB, CKMBINDEX, TROPONINI in the last 168 hours. BNP (last 3 results) No results for input(s): PROBNP in the last 8760 hours. HbA1C: No results for input(s): HGBA1C in the last 72 hours. CBG: Recent Labs  Lab 03/28/24 1656 03/28/24 2133 03/29/24 0850 03/29/24 1154  GLUCAP 141* 163* 131* 159*   Lipid Profile: No results for input(s): CHOL, HDL, LDLCALC, TRIG, CHOLHDL, LDLDIRECT in the last 72 hours. Thyroid  Function Tests: No results for input(s): TSH, T4TOTAL, FREET4, T3FREE, THYROIDAB in the last 72 hours. Anemia Panel: No results for input(s): VITAMINB12,  FOLATE, FERRITIN, TIBC, IRON , RETICCTPCT in the last 72 hours. Sepsis Labs: Recent Labs  Lab 03/28/24 1026 03/28/24 1222 03/28/24 1640 03/28/24 2018  PROCALCITON  --   --   --  <0.10  LATICACIDVEN 2.6* 2.6* 3.1* 2.8*    Recent Results (from the past 240 hours)  Resp panel by RT-PCR (RSV, Flu A&B, Covid) Anterior Nasal Swab     Status: None   Collection Time: 03/28/24  8:49 AM   Specimen: Anterior Nasal Swab  Result Value Ref Range Status   SARS Coronavirus 2 by RT PCR NEGATIVE NEGATIVE Final    Comment: (NOTE) SARS-CoV-2 target nucleic acids are NOT DETECTED.  The SARS-CoV-2 RNA is generally detectable in upper respiratory specimens during the acute phase of infection. The lowest concentration of SARS-CoV-2 viral copies this assay can detect is 138 copies/mL. A negative result does not preclude SARS-Cov-2 infection and should not be used as the sole basis for treatment or other patient management decisions. A negative result may occur with  improper specimen collection/handling, submission of  specimen other than nasopharyngeal swab, presence of viral mutation(s) within the areas targeted by this assay, and inadequate number of viral copies(<138 copies/mL). A negative result must be combined with clinical observations, patient history, and epidemiological information. The expected result is Negative.  Fact Sheet for Patients:  BloggerCourse.com  Fact Sheet for Healthcare Providers:  SeriousBroker.it  This test is no t yet approved or cleared by the [place]  FDA and  has been authorized for detection and/or diagnosis of SARS-CoV-2 by FDA under an Emergency Use Authorization (EUA). This EUA will remain  in effect (meaning this test can be used) for the duration of the COVID-19 declaration under Section 564(b)(1) of the Act, 21 U.S.C.section 360bbb-3(b)(1), unless the authorization is terminated  or revoked  sooner.       Influenza A by PCR NEGATIVE NEGATIVE Final   Influenza B by PCR NEGATIVE NEGATIVE Final    Comment: (NOTE) The Xpert Xpress SARS-CoV-2/FLU/RSV plus assay is intended as an aid in the diagnosis of influenza from Nasopharyngeal swab specimens and should not be used as a sole basis for treatment. Nasal washings and aspirates are unacceptable for Xpert Xpress SARS-CoV-2/FLU/RSV testing.  Fact Sheet for Patients: BloggerCourse.com  Fact Sheet for Healthcare Providers: SeriousBroker.it  This test is not yet approved or cleared by the [place]  FDA and has been authorized for detection and/or diagnosis of SARS-CoV-2 by FDA under an Emergency Use Authorization (EUA). This EUA will remain in effect (meaning this test can be used) for the duration of the COVID-19 declaration under Section 564(b)(1) of the Act, 21 U.S.C. section 360bbb-3(b)(1), unless the authorization is terminated or revoked.     Resp Syncytial Virus by PCR NEGATIVE NEGATIVE Final    Comment: (NOTE) Fact Sheet for Patients: BloggerCourse.com  Fact Sheet for Healthcare Providers: SeriousBroker.it  This test is not yet approved or cleared by the [place]  FDA and has been authorized for detection and/or diagnosis of SARS-CoV-2 by FDA under an Emergency Use Authorization (EUA). This EUA will remain in effect (meaning this test can be used) for the duration of the COVID-19 declaration under Section 564(b)(1) of the Act, 21 U.S.C. section 360bbb-3(b)(1), unless the authorization is terminated or revoked.  Performed at [facility], [address] [place], [place] [address]   Blood culture (routine x 2)     Status: None (Preliminary result)   Collection Time: 03/28/24 10:26 AM   Specimen: BLOOD  Result Value Ref Range Status   Specimen Description BLOOD LEFT ANTECUBITAL  Final    Special Requests   Final    BOTTLES DRAWN AEROBIC AND ANAEROBIC Blood Culture adequate volume   Culture   Final    NO GROWTH < 24 HOURS Performed at [facility], [address], [place], [place] [address]    Report Status PENDING  Incomplete  Blood culture (routine x 2)     Status: None (Preliminary result)   Collection Time: 03/28/24 10:26 AM   Specimen: BLOOD  Result Value Ref Range Status   Specimen Description BLOOD RIGHT ANTECUBITAL  Final   Special Requests   Final    BOTTLES DRAWN AEROBIC AND ANAEROBIC Blood Culture adequate volume   Culture   Final    NO GROWTH < 24 HOURS Performed at [facility], [address], [place], [place] [address]    Report Status PENDING  Incomplete         Radiology Studies: CT ABDOMEN PELVIS WO CONTRAST Result Date: 03/28/2024 CLINICAL DATA:  Abdominal  pain, acute, nonlocalized EXAM: CT ABDOMEN AND PELVIS WITHOUT CONTRAST TECHNIQUE: Multidetector CT imaging of the abdomen and pelvis was performed following the standard protocol without IV contrast. RADIATION DOSE REDUCTION: This exam was performed according to the departmental dose-optimization program which includes automated exposure control, adjustment of the mA and/or kV according to patient size and/or use of iterative reconstruction technique. COMPARISON:  CT of the abdomen and pelvis dated December 15, 2021. FINDINGS: Lower chest: There are few tiny reticulonodular opacities present in the posterior periphery of the right lower lobe. A cardiac pacer lead is present. Hepatobiliary: Status post cholecystectomy. The liver is mildly prominent, measuring approximately 18 cm in the midclavicular line. No masses are present and there is no biliary ductal dilatation. Pancreas: Normal. Spleen: Normal. Adrenals/Urinary Tract: Adrenal glands are unremarkable. Kidneys are normal, without renal calculi, focal lesion, or hydronephrosis. Bladder is unremarkable. Stomach/Bowel: The appendix is  not identified. The patient is status post sigmoid colectomy. The bowel is otherwise unremarkable. Vascular/Lymphatic: The abdominal aorta is normal in caliber and unremarkable in appearance. An IVC filter is present. Reproductive: Status post hysterectomy and bilateral salpingo oophorectomy. Other: No free fluid or lymphadenopathy. Musculoskeletal: No fractures or osseous lesions present. IMPRESSION: 1. Mild hepatomegaly. 2. Status post sigmoid colectomy. Electronically Signed   By: Evalene Coho M.D.   On: 03/28/2024 11:31   CT HEAD WO CONTRAST ( ) Result Date: 03/28/2024 CLINICAL DATA:  Headache, new onset (Age >= 51y) EXAM: CT HEAD WITHOUT CONTRAST TECHNIQUE: Contiguous axial images were obtained from the base of the skull through the vertex without intravenous contrast. RADIATION DOSE REDUCTION: This exam was performed according to the departmental dose-optimization program which includes automated exposure control, adjustment of the mA and/or kV according to patient size and/or use of iterative reconstruction technique. COMPARISON:  CT the head dated December 27, 2023. FINDINGS: Brain: The brain appears normal. There is no evidence of hemorrhage, mass, cortical infarct or hydrocephalus. Vascular: Negative. Skull: Intact and unremarkable. Sinuses/Orbits: Mild mucosal disease within the frontal and sphenoid sinuses. The patient is status post bilateral sinonasal surgery. The orbits are unremarkable. Other: None. IMPRESSION: 1. Normal brain. 2. Mild paranasal sinus disease status post surgery. Electronically Signed   By: Evalene Coho M.D.   On: 03/28/2024 11:23   DG Chest 2 View Result Date: 03/28/2024 CLINICAL DATA:  Cough EXAM: CHEST - 2 VIEW COMPARISON:  None Available. FINDINGS: LEFT-sided pacer overlies normal cardiac silhouette. No effusion, infiltrate, pneumothorax. Degenerative osteophytosis of the spine. IMPRESSION: No acute cardiopulmonary process. Electronically Signed   By: Jackquline Boxer M.D.   On: 03/28/2024 11:05        Scheduled Meds:  benzonatate   200 mg Oral TID   gabapentin   300 mg Oral TID   guaiFENesin   600 mg Oral BID   heparin   5,000 Units Subcutaneous Q8H   insulin  aspart  0-15 Units Subcutaneous TID WC   insulin  aspart  0-5 Units Subcutaneous QHS   lacosamide   200 mg Oral BID   levothyroxine   200 mcg Oral QAC breakfast   methylPREDNISolone  (SOLU-MEDROL ) injection  40 mg Intravenous Q12H   rOPINIRole   4 mg Oral QHS   Continuous Infusions:  promethazine  (PHENERGAN ) injection (IM or IVPB) 12.5 mg (03/29/24 1221)     LOS: 1 day     Calvin KATHEE Robson, MD [facility]   If 7PM-7AM, please contact night-coverage  03/29/2024, 2:10 PM

## 2024-03-29 NOTE — Progress Notes (Signed)
 Pharmacy - Brief note (vancomycin )  Pharmacy placed orders for vancomycin  yesterday with vancomycin  2gm loading dose x1 due at 12:45p 7/24 which was never charted as being administered.  Plan: Give vancomycin  2gm IV x 1 now and start the maintenance dose of 1500mg  IV q24h tomorrow. Follow renal function Follow ability to narrow - follow-up need for vancomycin , narrowing cefepime  to ceftriaxone .    Calistro Rauf, PharmD, BCPS, BCIDP Work Cell: [phone] 03/29/2024 7:57 AM

## 2024-03-30 DIAGNOSIS — R6510 Systemic inflammatory response syndrome (SIRS) of non-infectious origin without acute organ dysfunction: Secondary | ICD-10-CM | POA: Diagnosis not present

## 2024-03-30 DIAGNOSIS — G893 Neoplasm related pain (acute) (chronic): Secondary | ICD-10-CM | POA: Diagnosis not present

## 2024-03-30 DIAGNOSIS — C7A092 Malignant carcinoid tumor of the stomach: Secondary | ICD-10-CM | POA: Diagnosis not present

## 2024-03-30 DIAGNOSIS — E669 Obesity, unspecified: Secondary | ICD-10-CM | POA: Diagnosis not present

## 2024-03-30 LAB — GLUCOSE, CAPILLARY
Glucose-Capillary: 343 mg/dL — ABNORMAL HIGH (ref 70–99)
Glucose-Capillary: 191 mg/dL — ABNORMAL HIGH (ref 70–99)
Glucose-Capillary: 223 mg/dL — ABNORMAL HIGH (ref 70–99)

## 2024-03-30 MED ORDER — PREDNISONE 20 MG PO TABS: 40.0000 mg | ORAL_TABLET | Freq: Every day | ORAL | 0 refills | Status: AC

## 2024-03-30 MED ORDER — BENZONATATE 200 MG PO CAPS: 200.0000 mg | ORAL_CAPSULE | Freq: Three times a day (TID) | ORAL | 0 refills | Status: DC

## 2024-03-30 MED ORDER — MORPHINE SULFATE 15 MG PO TABS: 15.0000 mg | ORAL_TABLET | Freq: Four times a day (QID) | ORAL | 0 refills | Status: DC | PRN

## 2024-03-30 MED ORDER — GUAIFENESIN ER 600 MG PO TB12: 600.0000 mg | ORAL_TABLET | Freq: Two times a day (BID) | ORAL | 0 refills | Status: DC

## 2024-03-30 NOTE — Discharge Instructions (Signed)
  [employer]   [email or web address]   [facility] [phone]- Call to schedule an appointment for the [facility].

## 2024-03-30 NOTE — Plan of Care (Signed)
  Problem: Fluid Volume: Goal: Hemodynamic stability will improve Outcome: Adequate for Discharge   Problem: Clinical Measurements: Goal: Diagnostic test results will improve Outcome: Adequate for Discharge Goal: Signs and symptoms of infection will decrease Outcome: Adequate for Discharge   Problem: Respiratory: Goal: Ability to maintain adequate ventilation will improve Outcome: Adequate for Discharge   Problem: Education: Goal: Ability to describe self-care measures that may prevent or decrease complications (Diabetes Survival Skills Education) will improve Outcome: Adequate for Discharge Goal: Individualized Educational Video(s) Outcome: Adequate for Discharge   Problem: Coping: Goal: Ability to adjust to condition or change in health will improve Outcome: Adequate for Discharge   Problem: Fluid Volume: Goal: Ability to maintain a balanced intake and output will improve Outcome: Adequate for Discharge   Problem: Health Behavior/Discharge Planning: Goal: Ability to identify and utilize available resources and services will improve Outcome: Adequate for Discharge Goal: Ability to manage health-related needs will improve Outcome: Adequate for Discharge   Problem: Metabolic: Goal: Ability to maintain appropriate glucose levels will improve Outcome: Adequate for Discharge   Problem: Nutritional: Goal: Maintenance of adequate nutrition will improve Outcome: Adequate for Discharge Goal: Progress toward achieving an optimal weight will improve Outcome: Adequate for Discharge   Problem: Skin Integrity: Goal: Risk for impaired skin integrity will decrease Outcome: Adequate for Discharge   Problem: Tissue Perfusion: Goal: Adequacy of tissue perfusion will improve Outcome: Adequate for Discharge   Problem: Education: Goal: Knowledge of General Education information will improve Description: Including pain rating scale, medication(s)/side effects and non-pharmacologic  comfort measures Outcome: Adequate for Discharge   Problem: Health Behavior/Discharge Planning: Goal: Ability to manage health-related needs will improve Outcome: Adequate for Discharge   Problem: Clinical Measurements: Goal: Ability to maintain clinical measurements within normal limits will improve Outcome: Adequate for Discharge Goal: Will remain free from infection Outcome: Adequate for Discharge Goal: Diagnostic test results will improve Outcome: Adequate for Discharge Goal: Respiratory complications will improve Outcome: Adequate for Discharge Goal: Cardiovascular complication will be avoided Outcome: Adequate for Discharge   Problem: Activity: Goal: Risk for activity intolerance will decrease Outcome: Adequate for Discharge   Problem: Nutrition: Goal: Adequate nutrition will be maintained Outcome: Adequate for Discharge   Problem: Coping: Goal: Level of anxiety will decrease Outcome: Adequate for Discharge   Problem: Elimination: Goal: Will not experience complications related to bowel motility Outcome: Adequate for Discharge Goal: Will not experience complications related to urinary retention Outcome: Adequate for Discharge   Problem: Pain Managment: Goal: General experience of comfort will improve and/or be controlled Outcome: Adequate for Discharge   Problem: Safety: Goal: Ability to remain free from injury will improve Outcome: Adequate for Discharge   Problem: Skin Integrity: Goal: Risk for impaired skin integrity will decrease Outcome: Adequate for Discharge

## 2024-03-30 NOTE — Plan of Care (Signed)
  Problem: Clinical Measurements: Goal: Signs and symptoms of infection will decrease Outcome: Progressing   Problem: Respiratory: Goal: Ability to maintain adequate ventilation will improve Outcome: Progressing   Problem: Education: Goal: Knowledge of General Education information will improve Description: Including pain rating scale, medication(s)/side effects and non-pharmacologic comfort measures Outcome: Progressing   Problem: Health Behavior/Discharge Planning: Goal: Ability to manage health-related needs will improve Outcome: Progressing   Problem: Coping: Goal: Level of anxiety will decrease Outcome: Not Progressing   Problem: Pain Managment: Goal: General experience of comfort will improve and/or be controlled Outcome: Not Progressing

## 2024-03-30 NOTE — TOC Initial Note (Signed)
 Transition of Care [facility]) - Initial/Assessment Note    Patient Details  Name: Brittany Nguyen MRN: [id number] Date of Birth: [birth year]  Transition of Care [facility] CM/SW Contact:    [place] L Navika Hoopes, LCSW Phone Number: 03/30/2024, 8:11 AM  Clinical Narrative:                  CSW spoke with patient regarding [facility] consult for medication assistance. [name] advised that she is able to afford her medications because they are free. She reported needing resources for gas and food. She stated that some appointments are in [place]. She advised that when she applied for [employer], she was only eligible for $23.00 in assistance.   CSW spoke to [name] about [name] and other resources to assist. CSW could not provide any resources on gas however, CSW advised that if the [employer] are utilized it should help save money and she may have the ability to afford gas to get to her appointments. [name] was receptive to this information.   Resources will be uploaded to the AVS.        Patient Goals and CMS Choice            Expected Discharge Plan and Services                                              Prior Living Arrangements/Services                       Activities of Daily Living   ADL Screening (condition at time of admission) Independently performs ADLs?: Yes (appropriate for developmental age) Is the patient deaf or have difficulty hearing?: No Does the patient have difficulty seeing, even when wearing glasses/contacts?: No Does the patient have difficulty concentrating, remembering, or making decisions?: No  Permission Sought/Granted                  Emotional Assessment              Admission diagnosis:  Hypokalemia [E87.6] SIRS (systemic inflammatory response syndrome) (HCC) [R65.10] Leukocytosis, unspecified type [D72.829] Patient Active Problem List   Diagnosis Date Noted   SIRS (systemic inflammatory response syndrome) (HCC)  03/28/2024   Dysuria 03/28/2024   AICD discharge 03/03/2024   Obesity (BMI 30-39.9) 03/03/2024   Hypotensive episode 12/27/2023   HFrEF (heart failure with reduced ejection fraction) (HCC) 12/27/2023   Shortness of breath 12/08/2023   Nausea 12/08/2023   History of total knee arthroplasty, right 12/01/2023   Recurrent seizures (HCC) 09/23/2023   COPD exacerbation (HCC) 09/21/2023   Encephalopathy 11/30/2022   A-fib (HCC) 11/26/2022   Emotional stress 10/18/2022   HTN (hypertension) 09/19/2022   CAD (coronary artery disease) 09/19/2022   Depression with anxiety 09/19/2022   Nonproductive cough 09/11/2022   Hypothyroidism 09/11/2022   Status post total left knee replacement 08/24/2022   CHF (congestive heart failure) (HCC) 08/17/2022   History of DVT (deep vein thrombosis) 08/17/2022   MRSA (methicillin resistant Staphylococcus aureus) 08/17/2022   Hypocalcemia 07/31/2022   Diarrhea 07/31/2022   Essential hypertension 07/31/2022   Morbid obesity with body mass index (BMI) of 50.0 to 59.9 in adult (HCC) 07/31/2022   Seizures (HCC) 07/30/2022   Breast cancer (HCC) 05/23/2022   Bloody stool 04/22/2022   Refractory nausea and vomiting 12/15/2021   Migraine  11/09/2021   Neuropathy 11/09/2021   Anxiety 11/09/2021   At risk for prolonged QT interval syndrome - borderline long QT on EKG 11/07/21 in ED 11/07/2021   QT prolongation 09/27/2021   Cancer related pain 08/10/2021   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   Seizure disorder (HCC) 06/22/2021   Chronic diarrhea 06/22/2021   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   Moderate protein-calorie malnutrition (HCC) 06/19/2020   Non-ischemic cardiomyopathy (HCC) 01/23/2020   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/23/2020   Breakthrough seizure (HCC) 01/23/2020   Chronic use of opiate for therapeutic purpose 01/23/2020   Type 2 diabetes mellitus without complication (HCC) 01/23/2020   Current moderate  episode of major depressive disorder (HCC) 05/21/2019   Viral syndrome 04/24/2019   DVT (deep venous thrombosis) (HCC) 02/27/2019   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   Coronary artery disease involving native coronary artery of native heart 08/24/2018   Mild intermittent asthma without complication 08/24/2018   Breast mass 08/08/2018   Intractable migraine with aura without status migrainosus 06/20/2018   Primary osteoarthritis of right knee 06/01/2018   Pain in right knee 05/15/2018   Abdominal wall pain in right flank 04/10/2018   Postcholecystectomy diarrhea 04/10/2018   Iron  deficiency anemia 03/27/2018   Normocytic anemia 03/26/2018   Unstable angina (HCC)    Encounter for anticoagulation discussion and counseling    Influenza A 10/29/2017   COPD (chronic obstructive pulmonary disease) (HCC) 04/26/2017   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 02/03/2017   Hypotension 02/03/2017   Chest pain 12/09/2016   RLS (restless legs syndrome) 12/09/2016   Hypokalemia 11/22/2015   OSA on CPAP 04/17/2014   Hx MRSA infection    Lump or mass in breast    Asthma 2013   GOITER, MULTINODULAR 01/20/2009   HYPOGLYCEMIA, UNSPECIFIED 01/20/2009   GERD 01/20/2009   DIVERTICULITIS OF COLON 01/20/2009   Diverticulitis of colon 01/20/2009   PCP:  Valora Agent, MD Pharmacy:   [facility] [address], [place] - [address] [place] [address] Phone: [phone] Fax: [phone]     Social Drivers of Health (SDOH) Social History: SDOH Screenings   Food Insecurity: Food Insecurity Present (03/28/2024)  Housing: Low Risk  (03/28/2024)  Transportation Needs: No Transportation Needs (03/28/2024)  Utilities: Not At Risk (03/28/2024)  Financial Resource Strain: High Risk (11/24/2023)   Received from [facility]  Physical Activity:  Insufficiently Active (12/07/2020)   Received from [facility]  Social Connections: Socially Integrated (12/01/2023)  Stress: Stress Concern Present (12/07/2020)   Received from [facility]  Tobacco Use: Low Risk  (03/28/2024)   SDOH Interventions:     Readmission Risk Interventions     No data to display

## 2024-03-30 NOTE — Discharge Summary (Signed)
 Physician Discharge Summary  Brittany Nguyen Ahr FMW:[id number] DOB: 21-Jul-1966 DOA: 03/28/2024  PCP: Valora Agent, MD  Admit date: 03/28/2024 Discharge date: 03/30/2024  Admitted From: Home Disposition:  Home  Recommendations for Outpatient Follow-up:  Follow up with PCP in 1-2 weeks   Home Health: No Equipment/Devices: None  Discharge Condition: Stable CODE STATUS: Full Diet recommendation: Regular  Brief/Interim Summary:   58 year old female with history of COPD, glucose intolerance, hypertension, CAD, GERD, who presents to the emergency department for chief concerns of sick for a few days.    Patient reports she has not been feeling well for the past few days.  She reports last night her fever was very high and went up to 104F.  Patient took acetaminophen  and it improved to 101 last night.  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 Plan: DC home.  Empiric course of steroids.  Empiric antitussives.  No indication for antibiotics   Cough, fever, shortness of breath I suspect this may be related to underlying viral syndrome.  COVID/flu/RSV negative.  Patient still markedly symptomatic.  Short of  breath.  Despite not requiring oxygen  she is coughing. Plan: Respiratory viral panel negative.  Continues to suspect upper respiratory tract infection.  DC IV Solu-Medrol .  Start p.o. prednisone .  Discharge home on course of prednisone .  Prescribed mucolytic's and antitussives.  No indication for antibiotics as above.   Discharge Instructions  Discharge Instructions     Diet - low sodium heart healthy   Complete by: As directed    Increase activity slowly   Complete by: As directed       Allergies as of 03/30/2024       Reactions   Contrast Media [iodinated Contrast Media] Shortness Of Breath   Per CT report in 2014 pt had break through contrast reaction with hives and SOB following 13 hour prep. MSY    Ferumoxytol  Nausea Only   Iron  Sucrose Anaphylaxis   Lidocaine  Hives   Metrizamide Shortness Of Breath   Penicillins Hives, Other (See Comments)   Tolerated ceftriaxone  in past IgE = 4 (WNL) on 08/12/2022 ediate rash, facial/tongue/throat swelling, SOB or lightheadedness with hypotension, tachy   Sacubitril-valsartan    Sz (unclear if new start entresto vs hypoglycemia)   Isosorbide Nitrate Other (See Comments)   Headache   Latex Hives   IgE < 0.10 (WNL) on 08/21/2023   Tizanidine  Other (See Comments)   Feels altered   Povidone-iodine  Rash   Blistering rash   Pulmicort  [budesonide ] Itching        Medication List     TAKE these medications    albuterol  108 (90 Base) MCG/ACT inhaler Commonly known as: VENTOLIN  HFA Inhale 2 puffs into the lungs  every 6 (six) hours as needed for wheezing or shortness of breath.   benzonatate  200 MG capsule Commonly known as: TESSALON  Take 1 capsule (200 mg total) by mouth 3 (three) times daily.   cyanocobalamin  1000 MCG/ML injection Commonly known as: VITAMIN B12 Inject 1,000 mcg into the muscle every 30 (thirty) days.   doxycycline  100 MG capsule Commonly known as: VIBRAMYCIN  Take 100 mg by mouth 2 (two) times daily.    escitalopram  10 MG tablet Commonly known as: LEXAPRO  Take 10 mg by mouth every morning.   furosemide  40 MG tablet Commonly known as: LASIX  Take 80 mg by mouth 2 (two) times daily.   gabapentin  300 MG capsule Commonly known as: NEURONTIN  Take 1 capsule (300 mg total) by mouth 3 (three) times daily.   guaiFENesin  600 MG 12 hr tablet Commonly known as: MUCINEX  Take 1 tablet (600 mg total) by mouth 2 (two) times daily.   Lacosamide  100 MG Tabs Take 200 mg by mouth 2 (two) times daily.   levothyroxine  200 MCG tablet Commonly known as: SYNTHROID  Take 200 mcg by mouth daily before breakfast.   LORazepam  1 MG tablet Commonly known as: ATIVAN  Take 1 tablet (1 mg total) by mouth 4 (four) times daily as needed.   losartan  25 MG tablet Commonly known as: COZAAR  Take 12.5 mg by mouth 2 (two) times daily.   methadone  10 MG tablet Commonly known as: DOLOPHINE  Take 10-20 mg by mouth 3 (three) times daily. 10 MG BID and 20 mg HS   metoprolol  succinate 50 MG 24 hr tablet Commonly known as: TOPROL -XL Take 50 mg by mouth 2 (two) times daily.   morphine  15 MG tablet Commonly known as: MSIR Take 1 tablet (15 mg total) by mouth every 6 (six) hours as needed for severe pain (pain score 7-10).   oxyCODONE -acetaminophen  10-325 MG tablet Commonly known as: PERCOCET Take 1 tablet by mouth every 4 (four) hours as needed for pain.   Potassium Chloride  ER 20 MEQ Tbcr Take 20 mEq by mouth 2 (two) times daily. When taking torsemide    predniSONE  20 MG tablet Commonly known as: DELTASONE  Take 2 tablets (40 mg total) by mouth daily with breakfast for 5 days. What changed:  medication strength how much to take how to take this when to take this additional instructions   promethazine  25 MG tablet Commonly known as: PHENERGAN  Take 25 mg by mouth 3 (three) times daily.   rOPINIRole  4 MG tablet Commonly known as: REQUIP  Take 4 mg by mouth at bedtime.   spironolactone  50 MG  tablet Commonly known as: ALDACTONE  Take 50 mg by mouth 2 (two) times daily.   sulfamethoxazole -trimethoprim  800-160 MG tablet Commonly known as: BACTRIM  DS Take 1 tablet by mouth 2 (two) times daily.   tiZANidine  4 MG capsule Commonly known as: ZANAFLEX  Take 4 mg by mouth at bedtime.   torsemide  20 MG tablet Commonly known as: DEMADEX  Take 20 mg by mouth 2 (two) times daily as needed (fluid).   traZODone  50 MG tablet Commonly known as: DESYREL  Take 50 mg by mouth at bedtime.   Vitamin D  (Ergocalciferol ) 1.25 MG (50000 UNIT) Caps capsule Commonly known as: DRISDOL Take 50,000 Units by mouth once a week.        Allergies  Allergen Reactions   Contrast Media [Iodinated Contrast Media] Shortness Of Breath    Per CT report in 2014 pt had break through contrast reaction with hives and SOB following 13 hour prep. MSY    Ferumoxytol   Nausea Only   Iron  Sucrose Anaphylaxis   Lidocaine  Hives   Metrizamide Shortness Of Breath   Penicillins Hives and Other (See Comments)    Tolerated ceftriaxone  in past IgE = 4 (WNL) on 08/12/2022  ediate rash, facial/tongue/throat swelling, SOB or lightheadedness with hypotension, tachy   Sacubitril-Valsartan     Sz (unclear if new start entresto vs hypoglycemia)   Isosorbide Nitrate Other (See Comments)    Headache   Latex Hives    IgE < 0.10 (WNL) on 08/21/2023   Tizanidine  Other (See Comments)    Feels altered   Povidone-Iodine  Rash    Blistering rash   Pulmicort  [Budesonide ] Itching    Consultations: None   Procedures/Studies: DG Chest Port 1 View Result Date: 03/29/2024 CLINICAL DATA:  Fever and shortness of breath EXAM: PORTABLE CHEST 1 VIEW COMPARISON:  Chest radiograph dated 03/28/2024 FINDINGS: Left chest wall ICD leads project over the right atrium and ventricle. Normal lung volumes. Minimal bibasilar patchy opacities. No pleural effusion or pneumothorax. Similar mildly enlarged cardiomediastinal silhouette. No acute osseous  abnormality. Surgical clips project over the thoracic inlet. IMPRESSION: 1. Minimal bibasilar patchy opacities, which may represent atelectasis, aspiration, or pneumonia. 2. Similar mild cardiomegaly. Electronically Signed   By: Limin  Xu M.D.   On: 03/29/2024 16:34   CT ABDOMEN PELVIS WO CONTRAST Result Date: 03/28/2024 CLINICAL DATA:  Abdominal pain, acute, nonlocalized EXAM: CT ABDOMEN AND PELVIS WITHOUT CONTRAST TECHNIQUE: Multidetector CT imaging of the abdomen and pelvis was performed following the standard protocol without IV contrast. RADIATION DOSE REDUCTION: This exam was performed according to the departmental dose-optimization program which includes automated exposure control, adjustment of the mA and/or kV according to patient size and/or use of iterative reconstruction technique. COMPARISON:  CT of the abdomen and pelvis dated December 15, 2021. FINDINGS: Lower chest: There are few tiny reticulonodular opacities present in the posterior periphery of the right lower lobe. A cardiac pacer lead is present. Hepatobiliary: Status post cholecystectomy. The liver is mildly prominent, measuring approximately 18 cm in the midclavicular line. No masses are present and there is no biliary ductal dilatation. Pancreas: Normal. Spleen: Normal. Adrenals/Urinary Tract: Adrenal glands are unremarkable. Kidneys are normal, without renal calculi, focal lesion, or hydronephrosis. Bladder is unremarkable. Stomach/Bowel: The appendix is not identified. The patient is status post sigmoid colectomy. The bowel is otherwise unremarkable. Vascular/Lymphatic: The abdominal aorta is normal in caliber and unremarkable in appearance. An IVC filter is present. Reproductive: Status post hysterectomy and bilateral salpingo oophorectomy. Other: No free fluid or lymphadenopathy. Musculoskeletal: No fractures or osseous lesions present. IMPRESSION: 1. Mild hepatomegaly. 2. Status post sigmoid colectomy. Electronically Signed   By: Evalene Coho M.D.   On: 03/28/2024 11:31   CT HEAD WO CONTRAST ( ) Result Date: 03/28/2024 CLINICAL DATA:  Headache, new onset (Age >= 51y) EXAM: CT HEAD WITHOUT CONTRAST TECHNIQUE: Contiguous axial images were obtained from the base of the skull through the vertex without intravenous contrast. RADIATION DOSE REDUCTION: This exam was performed according to the departmental dose-optimization program which includes automated exposure control, adjustment of the mA and/or kV according to patient size and/or use of iterative reconstruction technique. COMPARISON:  CT the head dated December 27, 2023. FINDINGS: Brain: The brain appears normal. There is no evidence of hemorrhage, mass, cortical infarct or hydrocephalus. Vascular: Negative. Skull: Intact and unremarkable. Sinuses/Orbits: Mild mucosal disease within the frontal and sphenoid sinuses. The patient is status post bilateral sinonasal surgery. The orbits are unremarkable. Other: None. IMPRESSION: 1. Normal  brain. 2. Mild paranasal sinus disease status post surgery. Electronically Signed   By: Evalene Coho M.D.   On: 03/28/2024 11:23   DG Chest 2 View Result Date: 03/28/2024 CLINICAL DATA:  Cough EXAM: CHEST - 2 VIEW COMPARISON:  None Available. FINDINGS: LEFT-sided pacer overlies normal cardiac silhouette. No effusion, infiltrate, pneumothorax. Degenerative osteophytosis of the spine. IMPRESSION: No acute cardiopulmonary process. Electronically Signed   By: Jackquline Boxer M.D.   On: 03/28/2024 11:05   DG Chest 2 View Result Date: 03/14/2024 CLINICAL DATA:  Worsening midsternal chest pain since Thursday, short of breath, cough EXAM: CHEST - 2 VIEW COMPARISON:  03/03/2024 FINDINGS: Frontal and lateral views of the chest demonstrate stable dual lead pacer/AICD. Cardiac silhouette is unremarkable. No acute airspace disease, effusion, or pneumothorax. No acute bony abnormalities. IMPRESSION: 1. Stable chest, no acute process. Electronically Signed   By:  Ozell Daring M.D.   On: 03/14/2024 09:32   NM Pulmonary Perfusion Result Date: 03/04/2024 CLINICAL DATA:  Short of breath.  Concern pulmonary embolism EXAM: NUCLEAR MEDICINE PERFUSION LUNG SCAN TECHNIQUE: Perfusion images were obtained in multiple projections after intravenous injection of radiopharmaceutical. RADIOPHARMACEUTICALS:  4.32 mCi Tc-27m MAA COMPARISON:  None Available. FINDINGS: No filling defects within the pulmonary arteries to suggest acute pulmonary embolism. IMPRESSION: No evidence acute pulmonary embolism. Electronically Signed   By: Jackquline Boxer M.D.   On: 03/04/2024 17:27   US  Venous Img Lower Bilateral (DVT) Result Date: 03/04/2024 CLINICAL DATA:  Positive D-dimer in a patient with a history of prior DVT/PE/IVC filter. EXAM: Bilateral LOWER EXTREMITY VENOUS DOPPLER ULTRASOUND TECHNIQUE: Gray-scale sonography with compression, as well as color and duplex ultrasound, were performed to evaluate the deep venous system(s) from the level of the common femoral vein through the popliteal and proximal calf veins. COMPARISON:  None Available. FINDINGS: VENOUS Normal compressibility of the common femoral, superficial femoral, and popliteal veins, as well as the visualized calf veins. Visualized portions of profunda femoral vein and great saphenous vein unremarkable. No filling defects to suggest DVT on grayscale or color Doppler imaging. Doppler waveforms show normal direction of venous flow, normal respiratory plasticity and response to augmentation. Limited views of the contralateral common femoral vein are unremarkable. OTHER None. Limitations: none IMPRESSION: Negative for DVT bilateral lower extremities Electronically Signed   By: Cordella Banner   On: 03/04/2024 13:29   DG Chest Portable 1 View Result Date: 03/03/2024 CLINICAL DATA:  sob, cough EXAM: PORTABLE CHEST - 1 VIEW COMPARISON:  12/27/2023 FINDINGS: Lungs are clear. Heart size and mediastinal contours are within normal limits.  Stable left subclavian AICD. No effusion. Visualized bones unremarkable. IMPRESSION: No acute cardiopulmonary disease. Electronically Signed   By: JONETTA Faes M.D.   On: 03/03/2024 16:40      Subjective: Seen and examined on the day of discharge.  Stable no distress.  Appropriate discharge home.  Discharge Exam: Vitals:   03/30/24 0504 03/30/24 0845  BP: (!) 109/59 113/77  Pulse: 87 88  Resp: 19 16  Temp: 98.2 F (36.8 C) 98.5 F (36.9 C)  SpO2: 96% 92%   Vitals:   03/29/24 1559 03/29/24 2025 03/30/24 0504 03/30/24 0845  BP: 109/82 104/76 (!) 109/59 113/77  Pulse: 97 (!) 107 87 88  Resp: (!) 21 19 19 16   Temp: 98.2 F (36.8 C) 98.7 F (37.1 C) 98.2 F (36.8 C) 98.5 F (36.9 C)  TempSrc:  Oral Oral   SpO2: 98% 95% 96% 92%  Weight:  Height:        General: Pt is alert, awake, not in acute distress Cardiovascular: RRR, S1/S2 +, no rubs, no gallops Respiratory: CTA bilaterally, no wheezing, no rhonchi Abdominal: Soft, NT, ND, bowel sounds + Extremities: no edema, no cyanosis    The results of significant diagnostics from this hospitalization (including imaging, microbiology, ancillary and laboratory) are listed below for reference.     Microbiology: Recent Results (from the past 240 hours)  Resp panel by RT-PCR (RSV, Flu A&B, Covid) Anterior Nasal Swab     Status: None   Collection Time: 03/28/24  8:49 AM   Specimen: Anterior Nasal Swab  Result Value Ref Range Status   SARS Coronavirus 2 by RT PCR NEGATIVE NEGATIVE Final    Comment: (NOTE) SARS-CoV-2 target nucleic acids are NOT DETECTED.  The SARS-CoV-2 RNA is generally detectable in upper respiratory specimens during the acute phase of infection. The lowest concentration of SARS-CoV-2 viral copies this assay can detect is 138 copies/mL. A negative result does not preclude SARS-Cov-2 infection and should not be used as the sole basis for treatment or other patient management decisions. A negative result  may occur with  improper specimen collection/handling, submission of specimen other than nasopharyngeal swab, presence of viral mutation(s) within the areas targeted by this assay, and inadequate number of viral copies(<138 copies/mL). A negative result must be combined with clinical observations, patient history, and epidemiological information. The expected result is Negative.  Fact Sheet for Patients:  BloggerCourse.com  Fact Sheet for Healthcare Providers:  SeriousBroker.it  This test is no t yet approved or cleared by the [place]  FDA and  has been authorized for detection and/or diagnosis of SARS-CoV-2 by FDA under an Emergency Use Authorization (EUA). This EUA will remain  in effect (meaning this test can be used) for the duration of the COVID-19 declaration under Section 564(b)(1) of the Act, 21 U.S.C.section 360bbb-3(b)(1), unless the authorization is terminated  or revoked sooner.       Influenza A by PCR NEGATIVE NEGATIVE Final   Influenza B by PCR NEGATIVE NEGATIVE Final    Comment: (NOTE) The Xpert Xpress SARS-CoV-2/FLU/RSV plus assay is intended as an aid in the diagnosis of influenza from Nasopharyngeal swab specimens and should not be used as a sole basis for treatment. Nasal washings and aspirates are unacceptable for Xpert Xpress SARS-CoV-2/FLU/RSV testing.  Fact Sheet for Patients: BloggerCourse.com  Fact Sheet for Healthcare Providers: SeriousBroker.it  This test is not yet approved or cleared by the [place]  FDA and has been authorized for detection and/or diagnosis of SARS-CoV-2 by FDA under an Emergency Use Authorization (EUA). This EUA will remain in effect (meaning this test can be used) for the duration of the COVID-19 declaration under Section 564(b)(1) of the Act, 21 U.S.C. section 360bbb-3(b)(1), unless the authorization is terminated  or revoked.     Resp Syncytial Virus by PCR NEGATIVE NEGATIVE Final    Comment: (NOTE) Fact Sheet for Patients: BloggerCourse.com  Fact Sheet for Healthcare Providers: SeriousBroker.it  This test is not yet approved or cleared by the [place]  FDA and has been authorized for detection and/or diagnosis of SARS-CoV-2 by FDA under an Emergency Use Authorization (EUA). This EUA will remain in effect (meaning this test can be used) for the duration of the COVID-19 declaration under Section 564(b)(1) of the Act, 21 U.S.C. section 360bbb-3(b)(1), unless the authorization is terminated or revoked.  Performed at [facility], [address] [place], [place] [address]   Blood  culture (routine x 2)     Status: None (Preliminary result)   Collection Time: 03/28/24 10:26 AM   Specimen: BLOOD  Result Value Ref Range Status   Specimen Description BLOOD LEFT ANTECUBITAL  Final   Special Requests   Final    BOTTLES DRAWN AEROBIC AND ANAEROBIC Blood Culture adequate volume   Culture   Final    NO GROWTH 2 DAYS Performed at [facility], [address] [place], [place] [address]    Report Status PENDING  Incomplete  Blood culture (routine x 2)     Status: None (Preliminary result)   Collection Time: 03/28/24 10:26 AM   Specimen: BLOOD  Result Value Ref Range Status   Specimen Description BLOOD RIGHT ANTECUBITAL  Final   Special Requests   Final    BOTTLES DRAWN AEROBIC AND ANAEROBIC Blood Culture adequate volume   Culture   Final    NO GROWTH 2 DAYS Performed at [facility], [address], [place], [place] [address]    Report Status PENDING  Incomplete  Respiratory (~20 pathogens) panel by PCR     Status: None   Collection Time: 03/29/24  4:00 PM   Specimen: Nasopharyngeal Swab; Respiratory  Result Value Ref Range Status   Adenovirus NOT DETECTED NOT DETECTED Final   Coronavirus 229E NOT DETECTED  NOT DETECTED Final    Comment: (NOTE) The Coronavirus on the Respiratory Panel, DOES NOT test for the novel  Coronavirus (2019 nCoV)    Coronavirus HKU1 NOT DETECTED NOT DETECTED Final   Coronavirus NL63 NOT DETECTED NOT DETECTED Final   Coronavirus OC43 NOT DETECTED NOT DETECTED Final   Metapneumovirus NOT DETECTED NOT DETECTED Final   Rhinovirus / Enterovirus NOT DETECTED NOT DETECTED Final   Influenza A NOT DETECTED NOT DETECTED Final   Influenza B NOT DETECTED NOT DETECTED Final   Parainfluenza Virus 1 NOT DETECTED NOT DETECTED Final   Parainfluenza Virus 2 NOT DETECTED NOT DETECTED Final   Parainfluenza Virus 3 NOT DETECTED NOT DETECTED Final   Parainfluenza Virus 4 NOT DETECTED NOT DETECTED Final   Respiratory Syncytial Virus NOT DETECTED NOT DETECTED Final   Bordetella pertussis NOT DETECTED NOT DETECTED Final   Bordetella Parapertussis NOT DETECTED NOT DETECTED Final   Chlamydophila pneumoniae NOT DETECTED NOT DETECTED Final   Mycoplasma pneumoniae NOT DETECTED NOT DETECTED Final    Comment: Performed at [facility], [address] [place], [place] [address]     Labs: BNP (last 3 results) Recent Labs    03/03/24 1604 03/14/24 0906 03/28/24 0849  BNP 34.2 34.2 18.2   Basic Metabolic Panel: Recent Labs  Lab 03/28/24 0849 03/28/24 1640 03/29/24 0443  NA 137  --  139  K 2.4* 2.9* 3.2*  CL 90*  --  100  CO2 30  --  28  GLUCOSE 143*  --  156*  BUN 7  --  14  CREATININE 1.04*  --  1.11*  CALCIUM  8.3*  --  7.7*  MG 1.8  --   --    Liver Function Tests: Recent Labs  Lab 03/28/24 0849  AST 30  ALT 19  ALKPHOS 92  BILITOT 0.5  PROT 7.8  ALBUMIN  4.1   No results for input(s): LIPASE, AMYLASE in the last 168 hours. No results for input(s): AMMONIA in the last 168 hours. CBC: Recent Labs  Lab 03/28/24 0849 03/29/24 0443  WBC 15.0* 6.6  NEUTROABS 10.7*  --   HGB 11.5* 9.3*  HCT 36.6 29.7*  MCV 79.2* 78.8*  PLT 387 256   Cardiac  Enzymes: No results for input(s): CKTOTAL, CKMB, CKMBINDEX, TROPONINI in the last 168 hours. BNP: Invalid input(s): POCBNP CBG: Recent Labs  Lab 03/29/24 0850 03/29/24 1154 03/29/24 1732 03/29/24 2113 03/30/24 0841  GLUCAP 131* 159* 272* 343* 191*   D-Dimer No results for input(s): DDIMER in the last 72 hours. Hgb A1c No results for input(s): HGBA1C in the last 72 hours. Lipid Profile No results for input(s): CHOL, HDL, LDLCALC, TRIG, CHOLHDL, LDLDIRECT in the last 72 hours. Thyroid  function studies No results for input(s): TSH, T4TOTAL, T3FREE, THYROIDAB in the last 72 hours.  Invalid input(s): FREET3 Anemia work up No results for input(s): VITAMINB12, FOLATE, FERRITIN, TIBC, IRON , RETICCTPCT in the last 72 hours. Urinalysis    Component Value Date/Time   COLORURINE STRAW (A) 12/27/2023 1136   APPEARANCEUR CLEAR (A) 12/27/2023 1136   APPEARANCEUR Clear 09/02/2014 2312   LABSPEC 1.006 12/27/2023 1136   LABSPEC 1.013 09/02/2014 2312   PHURINE 5.0 12/27/2023 1136   GLUCOSEU NEGATIVE 12/27/2023 1136   GLUCOSEU Negative 09/02/2014 2312   HGBUR NEGATIVE 12/27/2023 1136   BILIRUBINUR NEGATIVE 12/27/2023 1136   BILIRUBINUR Negative 09/02/2014 2312   KETONESUR NEGATIVE 12/27/2023 1136   PROTEINUR NEGATIVE 12/27/2023 1136   NITRITE NEGATIVE 12/27/2023 1136   LEUKOCYTESUR NEGATIVE 12/27/2023 1136   LEUKOCYTESUR Negative 09/02/2014 2312   Sepsis Labs Recent Labs  Lab 03/28/24 0849 03/29/24 0443  WBC 15.0* 6.6   Microbiology Recent Results (from the past 240 hours)  Resp panel by RT-PCR (RSV, Flu A&B, Covid) Anterior Nasal Swab     Status: None   Collection Time: 03/28/24  8:49 AM   Specimen: Anterior Nasal Swab  Result Value Ref Range Status   SARS Coronavirus 2 by RT PCR NEGATIVE NEGATIVE Final    Comment: (NOTE) SARS-CoV-2 target nucleic acids are NOT DETECTED.  The SARS-CoV-2 RNA is generally detectable in upper  respiratory specimens during the acute phase of infection. The lowest concentration of SARS-CoV-2 viral copies this assay can detect is 138 copies/mL. A negative result does not preclude SARS-Cov-2 infection and should not be used as the sole basis for treatment or other patient management decisions. A negative result may occur with  improper specimen collection/handling, submission of specimen other than nasopharyngeal swab, presence of viral mutation(s) within the areas targeted by this assay, and inadequate number of viral copies(<138 copies/mL). A negative result must be combined with clinical observations, patient history, and epidemiological information. The expected result is Negative.  Fact Sheet for Patients:  BloggerCourse.com  Fact Sheet for Healthcare Providers:  SeriousBroker.it  This test is no t yet approved or cleared by the [place]  FDA and  has been authorized for detection and/or diagnosis of SARS-CoV-2 by FDA under an Emergency Use Authorization (EUA). This EUA will remain  in effect (meaning this test can be used) for the duration of the COVID-19 declaration under Section 564(b)(1) of the Act, 21 U.S.C.section 360bbb-3(b)(1), unless the authorization is terminated  or revoked sooner.       Influenza A by PCR NEGATIVE NEGATIVE Final   Influenza B by PCR NEGATIVE NEGATIVE Final    Comment: (NOTE) The Xpert Xpress SARS-CoV-2/FLU/RSV plus assay is intended as an aid in the diagnosis of influenza from Nasopharyngeal swab specimens and should not be used as a sole basis for treatment. Nasal washings and aspirates are unacceptable for Xpert Xpress SARS-CoV-2/FLU/RSV testing.  Fact Sheet for Patients: BloggerCourse.com  Fact Sheet for Healthcare Providers: SeriousBroker.it  This test is not yet approved or cleared by the [place]  FDA and has been  authorized for detection and/or diagnosis of SARS-CoV-2 by FDA under an Emergency Use Authorization (EUA). This EUA will remain in effect (meaning this test can be used) for the duration of the COVID-19 declaration under Section 564(b)(1) of the Act, 21 U.S.C. section 360bbb-3(b)(1), unless the authorization is terminated or revoked.     Resp Syncytial Virus by PCR NEGATIVE NEGATIVE Final    Comment: (NOTE) Fact Sheet for Patients: BloggerCourse.com  Fact Sheet for Healthcare Providers: SeriousBroker.it  This test is not yet approved or cleared by the [place]  FDA and has been authorized for detection and/or diagnosis of SARS-CoV-2 by FDA under an Emergency Use Authorization (EUA). This EUA will remain in effect (meaning this test can be used) for the duration of the COVID-19 declaration under Section 564(b)(1) of the Act, 21 U.S.C. section 360bbb-3(b)(1), unless the authorization is terminated or revoked.  Performed at [facility], [address] [place], [place] [address]   Blood culture (routine x 2)     Status: None (Preliminary result)   Collection Time: 03/28/24 10:26 AM   Specimen: BLOOD  Result Value Ref Range Status   Specimen Description BLOOD LEFT ANTECUBITAL  Final   Special Requests   Final    BOTTLES DRAWN AEROBIC AND ANAEROBIC Blood Culture adequate volume   Culture   Final    NO GROWTH 2 DAYS Performed at [facility], [address], [place], [place] [address]    Report Status PENDING  Incomplete  Blood culture (routine x 2)     Status: None (Preliminary result)   Collection Time: 03/28/24 10:26 AM   Specimen: BLOOD  Result Value Ref Range Status   Specimen Description BLOOD RIGHT ANTECUBITAL  Final   Special Requests   Final    BOTTLES DRAWN AEROBIC AND ANAEROBIC Blood Culture adequate volume   Culture   Final    NO GROWTH 2 DAYS Performed at [facility], [address] [place], [place] [address]    Report Status PENDING  Incomplete  Respiratory (~20 pathogens) panel by PCR     Status: None   Collection Time: 03/29/24  4:00 PM   Specimen: Nasopharyngeal Swab; Respiratory  Result Value Ref Range Status   Adenovirus NOT DETECTED NOT DETECTED Final   Coronavirus 229E NOT DETECTED NOT DETECTED Final    Comment: (NOTE) The Coronavirus on the Respiratory Panel, DOES NOT test for the novel  Coronavirus (2019 nCoV)    Coronavirus HKU1 NOT DETECTED NOT DETECTED Final   Coronavirus NL63 NOT DETECTED NOT DETECTED Final   Coronavirus OC43 NOT DETECTED NOT DETECTED Final   Metapneumovirus NOT DETECTED NOT DETECTED Final   Rhinovirus / Enterovirus NOT DETECTED NOT DETECTED Final   Influenza A NOT DETECTED NOT DETECTED Final   Influenza B NOT DETECTED NOT DETECTED Final   Parainfluenza Virus 1 NOT DETECTED NOT DETECTED Final   Parainfluenza Virus 2 NOT DETECTED NOT DETECTED Final   Parainfluenza Virus 3 NOT DETECTED NOT DETECTED Final   Parainfluenza Virus 4 NOT DETECTED NOT DETECTED Final   Respiratory Syncytial Virus NOT DETECTED NOT DETECTED Final   Bordetella pertussis NOT DETECTED NOT DETECTED Final   Bordetella Parapertussis NOT DETECTED NOT DETECTED Final   Chlamydophila pneumoniae NOT DETECTED NOT DETECTED Final   Mycoplasma pneumoniae NOT DETECTED NOT DETECTED Final    Comment: Performed at [facility], [address] [place], [place] [address]     Time coordinating discharge: 40 minutes  SIGNED:   Calvin KATHEE Robson, MD  [facility] 03/30/2024, 11:21 AM Pager   If 7PM-7AM, please contact night-coverage

## 2024-03-30 NOTE — Progress Notes (Signed)
 Approximately 1310--Pt discharged to home. AVS discharge instructions provided to pt with all questions and concerns answered at this time. Teach-back technique utilized. All PIVs removed, sites WDL. All pt belongings taken with pt--pt verified. Pt transported to home with husband via personal vehicle.

## 2024-04-02 DIAGNOSIS — E890 Postprocedural hypothyroidism: Secondary | ICD-10-CM | POA: Diagnosis not present

## 2024-04-02 DIAGNOSIS — R739 Hyperglycemia, unspecified: Secondary | ICD-10-CM | POA: Diagnosis not present

## 2024-04-02 LAB — CULTURE, BLOOD (ROUTINE X 2)
Culture: NO GROWTH
Culture: NO GROWTH
Special Requests: ADEQUATE
Special Requests: ADEQUATE

## 2024-04-05 DIAGNOSIS — G8929 Other chronic pain: Secondary | ICD-10-CM | POA: Diagnosis not present

## 2024-04-05 DIAGNOSIS — R9431 Abnormal electrocardiogram [ECG] [EKG]: Secondary | ICD-10-CM | POA: Diagnosis not present

## 2024-04-05 DIAGNOSIS — R079 Chest pain, unspecified: Secondary | ICD-10-CM | POA: Diagnosis not present

## 2024-04-05 DIAGNOSIS — R109 Unspecified abdominal pain: Secondary | ICD-10-CM | POA: Diagnosis not present

## 2024-04-05 DIAGNOSIS — G40909 Epilepsy, unspecified, not intractable, without status epilepticus: Secondary | ICD-10-CM | POA: Diagnosis not present

## 2024-04-05 DIAGNOSIS — Z96653 Presence of artificial knee joint, bilateral: Secondary | ICD-10-CM | POA: Diagnosis not present

## 2024-04-05 DIAGNOSIS — I509 Heart failure, unspecified: Secondary | ICD-10-CM | POA: Diagnosis not present

## 2024-04-05 DIAGNOSIS — R569 Unspecified convulsions: Secondary | ICD-10-CM | POA: Diagnosis not present

## 2024-04-05 DIAGNOSIS — I959 Hypotension, unspecified: Secondary | ICD-10-CM | POA: Diagnosis not present

## 2024-04-05 DIAGNOSIS — R110 Nausea: Secondary | ICD-10-CM | POA: Diagnosis not present

## 2024-04-11 DIAGNOSIS — I5022 Chronic systolic (congestive) heart failure: Secondary | ICD-10-CM | POA: Diagnosis not present

## 2024-04-11 DIAGNOSIS — Z95810 Presence of automatic (implantable) cardiac defibrillator: Secondary | ICD-10-CM | POA: Diagnosis not present

## 2024-05-03 DIAGNOSIS — I5022 Chronic systolic (congestive) heart failure: Secondary | ICD-10-CM | POA: Diagnosis not present

## 2024-05-07 DIAGNOSIS — Z4502 Encounter for adjustment and management of automatic implantable cardiac defibrillator: Secondary | ICD-10-CM | POA: Diagnosis not present

## 2024-05-07 DIAGNOSIS — I5042 Chronic combined systolic (congestive) and diastolic (congestive) heart failure: Secondary | ICD-10-CM | POA: Diagnosis not present

## 2024-05-16 DIAGNOSIS — E039 Hypothyroidism, unspecified: Secondary | ICD-10-CM | POA: Diagnosis not present

## 2024-05-16 DIAGNOSIS — C50919 Malignant neoplasm of unspecified site of unspecified female breast: Secondary | ICD-10-CM | POA: Diagnosis not present

## 2024-05-16 DIAGNOSIS — G893 Neoplasm related pain (acute) (chronic): Secondary | ICD-10-CM | POA: Diagnosis not present

## 2024-05-16 DIAGNOSIS — Z515 Encounter for palliative care: Secondary | ICD-10-CM | POA: Diagnosis not present

## 2024-06-11 DIAGNOSIS — R8290 Unspecified abnormal findings in urine: Secondary | ICD-10-CM | POA: Diagnosis not present

## 2024-06-11 DIAGNOSIS — R300 Dysuria: Secondary | ICD-10-CM | POA: Diagnosis not present

## 2024-06-12 ENCOUNTER — Emergency Department

## 2024-06-12 ENCOUNTER — Emergency Department
Admission: EM | Admit: 2024-06-12 | Discharge: 2024-06-12 | Disposition: A | Discharge status: Home / Self Care | Source: Ambulatory Visit | Attending: Emergency Medicine | Admitting: Emergency Medicine

## 2024-06-12 ENCOUNTER — Other Ambulatory Visit: Payer: Self-pay

## 2024-06-12 DIAGNOSIS — N12 Tubulo-interstitial nephritis, not specified as acute or chronic: Secondary | ICD-10-CM | POA: Diagnosis not present

## 2024-06-12 DIAGNOSIS — R319 Hematuria, unspecified: Secondary | ICD-10-CM | POA: Diagnosis not present

## 2024-06-12 DIAGNOSIS — R1030 Lower abdominal pain, unspecified: Secondary | ICD-10-CM | POA: Diagnosis not present

## 2024-06-12 DIAGNOSIS — E876 Hypokalemia: Secondary | ICD-10-CM | POA: Insufficient documentation

## 2024-06-12 DIAGNOSIS — K5710 Diverticulosis of small intestine without perforation or abscess without bleeding: Secondary | ICD-10-CM | POA: Diagnosis not present

## 2024-06-12 DIAGNOSIS — R160 Hepatomegaly, not elsewhere classified: Secondary | ICD-10-CM | POA: Diagnosis not present

## 2024-06-12 LAB — CBC
HCT: 32.1 % — ABNORMAL LOW (ref 36.0–46.0)
Hemoglobin: 9.6 g/dL — ABNORMAL LOW (ref 12.0–15.0)
MCH: 23.8 pg — ABNORMAL LOW (ref 26.0–34.0)
MCHC: 29.9 g/dL — ABNORMAL LOW (ref 30.0–36.0)
MCV: 79.7 fL — ABNORMAL LOW (ref 80.0–100.0)
Platelets: 253 K/uL (ref 150–400)
RBC: 4.03 MIL/uL (ref 3.87–5.11)
RDW: 17.8 % — ABNORMAL HIGH (ref 11.5–15.5)
WBC: 6.6 K/uL (ref 4.0–10.5)
nRBC: 0 % (ref 0.0–0.2)

## 2024-06-12 LAB — URINALYSIS, ROUTINE W REFLEX MICROSCOPIC
Bilirubin Urine: NEGATIVE
Glucose, UA: NEGATIVE mg/dL
Hgb urine dipstick: NEGATIVE
Ketones, ur: NEGATIVE mg/dL
Leukocytes,Ua: NEGATIVE
Nitrite: POSITIVE — AB
Protein, ur: 30 mg/dL — AB
Specific Gravity, Urine: 1.027 (ref 1.005–1.030)
pH: 5 (ref 5.0–8.0)

## 2024-06-12 LAB — COMPREHENSIVE METABOLIC PANEL WITH GFR
ALT: 11 U/L (ref 0–44)
AST: 23 U/L (ref 15–41)
Albumin: 3.7 g/dL (ref 3.5–5.0)
Alkaline Phosphatase: 83 U/L (ref 38–126)
Anion gap: 12 (ref 5–15)
BUN: 8 mg/dL (ref 6–20)
CO2: 28 mmol/L (ref 22–32)
Calcium: 8.7 mg/dL — ABNORMAL LOW (ref 8.9–10.3)
Chloride: 99 mmol/L (ref 98–111)
Creatinine, Ser: 0.75 mg/dL (ref 0.44–1.00)
GFR, Estimated: >60 mL/min (ref >60)
Glucose, Bld: 183 mg/dL — ABNORMAL HIGH (ref 70–99)
Potassium: 2.9 mmol/L — ABNORMAL LOW (ref 3.5–5.1)
Sodium: 139 mmol/L (ref 135–145)
Total Bilirubin: 0.5 mg/dL (ref 0.0–1.2)
Total Protein: 7.4 g/dL (ref 6.5–8.1)

## 2024-06-12 LAB — LACTIC ACID, PLASMA: Lactic Acid, Venous: 1.9 mmol/L (ref 0.5–1.9)

## 2024-06-12 LAB — LIPASE, BLOOD: Lipase: 23 U/L (ref 11–51)

## 2024-06-12 MED ORDER — POTASSIUM CHLORIDE 20 MEQ PO PACK
40.0000 meq | PACK | Freq: Two times a day (BID) | ORAL | Status: DC
  Administered 2024-06-12: 40 meq via ORAL
  Filled 2024-06-12: qty 2

## 2024-06-12 MED ORDER — POTASSIUM CHLORIDE 10 MEQ/100ML IV SOLN
10.0000 meq | Freq: Once | INTRAVENOUS | Status: AC
  Administered 2024-06-12: 10 meq via INTRAVENOUS
  Filled 2024-06-12: qty 100

## 2024-06-12 MED ORDER — KETOROLAC TROMETHAMINE 15 MG/ML IJ SOLN
15.0000 mg | Freq: Once | INTRAMUSCULAR | Status: AC
  Administered 2024-06-12: 15 mg via INTRAVENOUS
  Filled 2024-06-12: qty 1

## 2024-06-12 MED ORDER — SODIUM CHLORIDE 0.9 % IV SOLN
1.0000 g | INTRAVENOUS | Status: DC
  Administered 2024-06-12: 1 g via INTRAVENOUS
  Filled 2024-06-12: qty 10

## 2024-06-12 MED ORDER — CEFDINIR 300 MG PO CAPS: 300.0000 mg | ORAL_CAPSULE | Freq: Two times a day (BID) | ORAL | 0 refills | Status: AC

## 2024-06-12 NOTE — ED Triage Notes (Signed)
 Patient states lower abdominal pain and blood in urine for 3 days; saw PCP yesterday and was told she may have a stone.

## 2024-06-12 NOTE — Discharge Instructions (Signed)
Please take the antibiotics as prescribed.  Please return for any new, worsening, or changing symptoms or other concerns.  It was a pleasure caring for you today.

## 2024-06-12 NOTE — ED Provider Notes (Signed)
 [facility] Provider Note    Event Date/Time   First MD Initiated Contact with Patient 06/12/24 1149     (approximate)   History   Abdominal Pain   HPI  Brittany Nguyen is a 58 y.o. female who presents today for evaluation of fever and flank pain for approximately 1 week.  She reports that she developed a fever over the weekend with a Tmax of 101F and 102F.  She went to her primary care provider yesterday who was concerned that she may have a kidney stone and advised her to come to the emergency department for evaluation.  She was started on Cipro  but patient reports that she never picked it up so she has not yet started this.  Patient Active Problem List   Diagnosis Date Noted   SIRS (systemic inflammatory response syndrome) (HCC) 03/28/2024   Dysuria 03/28/2024   AICD discharge 03/03/2024   Obesity (BMI 30-39.9) 03/03/2024   Hypotensive episode 12/27/2023   HFrEF (heart failure with reduced ejection fraction) (HCC) 12/27/2023   Shortness of breath 12/08/2023   Nausea 12/08/2023   History of total knee arthroplasty, right 12/01/2023   Recurrent seizures (HCC) 09/23/2023   COPD exacerbation (HCC) 09/21/2023   Encephalopathy 11/30/2022   A-fib (HCC) 11/26/2022   Emotional stress 10/18/2022   HTN (hypertension) 09/19/2022   CAD (coronary artery disease) 09/19/2022   Depression with anxiety 09/19/2022   Nonproductive cough 09/11/2022   Hypothyroidism 09/11/2022   Status post total left knee replacement 08/24/2022   CHF (congestive heart failure) (HCC) 08/17/2022   History of DVT (deep vein thrombosis) 08/17/2022   MRSA (methicillin resistant Staphylococcus aureus) 08/17/2022   Hypocalcemia 07/31/2022   Diarrhea 07/31/2022   Essential hypertension 07/31/2022   Morbid obesity with body mass index (BMI) of 50.0 to 59.9 in adult (HCC) 07/31/2022   Seizures (HCC) 07/30/2022   Breast cancer (HCC) 05/23/2022   Bloody stool 04/22/2022   Refractory  nausea and vomiting 12/15/2021   Migraine 11/09/2021   Neuropathy 11/09/2021   Anxiety 11/09/2021   At risk for prolonged QT interval syndrome - borderline long QT on EKG 11/07/21 in ED 11/07/2021   QT prolongation 09/27/2021   Cancer related pain 08/10/2021   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   Seizure disorder (HCC) 06/22/2021   Chronic diarrhea 06/22/2021   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   Moderate protein-calorie malnutrition 06/19/2020   Non-ischemic cardiomyopathy (HCC) 01/23/2020   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/23/2020   Breakthrough seizure (HCC) 01/23/2020   Chronic use of opiate for therapeutic purpose 01/23/2020   Type 2 diabetes mellitus without complication 01/23/2020   Current moderate episode of major depressive disorder (HCC) 05/21/2019   Viral syndrome 04/24/2019   DVT (deep venous thrombosis) (HCC) 02/27/2019   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   Coronary artery disease involving native coronary artery of native heart 08/24/2018   Mild intermittent asthma without complication 08/24/2018   Breast mass 08/08/2018   Intractable migraine with aura without status migrainosus 06/20/2018   Primary osteoarthritis of right knee 06/01/2018   Pain in right knee 05/15/2018   Abdominal wall pain in right flank 04/10/2018   Postcholecystectomy diarrhea 04/10/2018   Iron  deficiency anemia 03/27/2018   Normocytic anemia 03/26/2018   Unstable angina (HCC)    Encounter for anticoagulation discussion and counseling    Influenza A 10/29/2017   COPD (chronic obstructive pulmonary disease) (HCC) 04/26/2017  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 02/03/2017   Hypotension 02/03/2017   Chest pain 12/09/2016   RLS (restless legs syndrome) 12/09/2016   Hypokalemia 11/22/2015   OSA on CPAP 04/17/2014   Hx MRSA infection     Lump or mass in breast    Asthma 2013   GOITER, MULTINODULAR 01/20/2009   HYPOGLYCEMIA, UNSPECIFIED 01/20/2009   GERD 01/20/2009   DIVERTICULITIS OF COLON 01/20/2009   Diverticulitis of colon 01/20/2009          Physical Exam   Triage Vital Signs: ED Triage Vitals  Encounter Vitals Group     BP 06/12/24 1059 (!) 126/95     Girls Systolic BP Percentile --      Girls Diastolic BP Percentile --      Boys Systolic BP Percentile --      Boys Diastolic BP Percentile --      Pulse Rate 06/12/24 1059 99     Resp 06/12/24 1059 18     Temp 06/12/24 1059 98.7 F (37.1 C)     Temp Source 06/12/24 1059 Oral     SpO2 06/12/24 1059 98 %     Weight 06/12/24 1058 194 lb (88 kg)     Height 06/12/24 1058 5' 5 (1.651 m)     Head Circumference --      Peak Flow --      Pain Score 06/12/24 1058 10     Pain Loc --      Pain Education --      Exclude from Growth Chart --     Most recent vital signs: Vitals:   06/12/24 1059  BP: (!) 126/95  Pulse: 99  Resp: 18  Temp: 98.7 F (37.1 C)  SpO2: 98%    Physical Exam Vitals and nursing note reviewed.  Constitutional:      General: Awake and alert. No acute distress.    Appearance: Normal appearance. The patient is normal weight.  HENT:     Head: Normocephalic and atraumatic.     Mouth: Mucous membranes are moist.  Eyes:     General: PERRL. Normal EOMs        Right eye: No discharge.        Left eye: No discharge.     Conjunctiva/sclera: Conjunctivae normal.  Cardiovascular:     Rate and Rhythm: Normal rate and regular rhythm.     Pulses: Normal pulses.  Pulmonary:     Effort: Pulmonary effort is normal. No respiratory distress.     Breath sounds: Normal breath sounds.  Abdominal:     Abdomen is soft. There is no abdominal tenderness. No rebound or guarding. No distention.  Right-sided CVA tenderness Musculoskeletal:        General: No swelling. Normal range of motion.     Cervical back: Normal range of motion and neck  supple.  Skin:    General: Skin is warm and dry.     Capillary Refill: Capillary refill takes less than 2 seconds.     Findings: No rash.  Neurological:     Mental Status: The patient is awake and alert.      ED Results / Procedures / Treatments   Labs (all labs ordered are listed, but only abnormal results are displayed) Labs Reviewed  COMPREHENSIVE METABOLIC PANEL WITH GFR - Abnormal; Notable for the following components:      Result Value   Potassium 2.9 (*)    Glucose, Bld 183 (*)  Calcium  8.7 (*)    All other components within normal limits  CBC - Abnormal; Notable for the following components:   Hemoglobin 9.6 (*)    HCT 32.1 (*)    MCV 79.7 (*)    MCH 23.8 (*)    MCHC 29.9 (*)    RDW 17.8 (*)    All other components within normal limits  URINALYSIS, ROUTINE W REFLEX MICROSCOPIC - Abnormal; Notable for the following components:   Color, Urine RED (*)    APPearance HAZY (*)    Protein, ur 30 (*)    Nitrite POSITIVE (*)    Bacteria, UA MANY (*)    All other components within normal limits  CULTURE, BLOOD (ROUTINE X 2)  CULTURE, BLOOD (ROUTINE X 2)  URINE CULTURE  LIPASE, BLOOD  LACTIC ACID, PLASMA  LACTIC ACID, PLASMA     EKG     RADIOLOGY I independently reviewed and interpreted imaging and agree with radiologists findings.     PROCEDURES:  Critical Care performed:   Procedures   MEDICATIONS ORDERED IN ED: Medications  cefTRIAXone  (ROCEPHIN ) 1 g in sodium chloride  0.9 % 100 mL IVPB (0 g Intravenous Stopped 06/12/24 1240)  potassium chloride  (KLOR-CON ) packet 40 mEq (40 mEq Oral Given 06/12/24 1328)  potassium chloride  10 mEq in 100 mL IVPB (10 mEq Intravenous New Bag/Given 06/12/24 1335)  ketorolac  (TORADOL ) 15 MG/ML injection 15 mg (15 mg Intravenous Given 06/12/24 1403)     IMPRESSION / MDM / ASSESSMENT AND PLAN / ED COURSE  I reviewed the triage vital signs and the nursing notes.   Differential diagnosis includes, but is not limited  to, pyelonephritis, nephrolithiasis, ureteral colic, sepsis.  I reviewed the patient's chart.  Patient was seen in clinic yesterday with the same complaint.  She was started on Cipro  for possible pyelonephritis.  Given that she has only had 1 day of antibiotics, she has not yet had a treatment failure.  A urine culture was not obtained, therefore we will obtain one today.  Patient is awake and alert, hemodynamically stable and afebrile.  She is nontoxic in appearance.  Labs are overall reassuring including normal lactate, no leukocytosis.  She does have hypokalemia to 2.9 and this was repleted with oral and IV potassium.  CT scan obtained does not reveal evidence of ureteral stone.  Exam is consistent with pyelonephritis.  She was given 1 g of Rocephin .  Patient was also given Toradol  for pain control.  Patient has not had any nausea or vomiting and declined Zofran .  She was hydrated with normal saline.  Upon reevaluation she reports that she feels significantly improved.  I offered her admission given her pain, fever at home, hypokalemia, and reported nausea at home, however patient declined and prefers to try outpatient treatment first.  She has not failed outpatient treatment yet given that she has not filled the Cipro .  She was started on cefdinir.  We discussed strict return precautions and the importance of very close outpatient follow-up with her PCP.  Patient understands and agrees with plan.  She was discharged in stable condition.  Patient's presentation is most consistent with acute presentation with potential threat to life or bodily function.   Clinical Course as of 06/12/24 1459  Wed Jun 12, 2024  1440 Patient reports feeling markedly improved and ready for discharge home [JP]    Clinical Course User Index [JP] Miaya Lafontant E, PA-C     FINAL CLINICAL IMPRESSION(S) / ED DIAGNOSES   Final diagnoses:  Pyelonephritis  Hypokalemia     Rx / DC Orders   ED Discharge Orders           Ordered    cefdinir (OMNICEF) 300 MG capsule  2 times daily        06/12/24 1455             Note:  This document was prepared using Dragon voice recognition software and may include unintentional dictation errors.   Maitri Schnoebelen E, PA-C 06/12/24 1459    Suzanne Kirsch, MD 06/12/24 1620

## 2024-06-14 LAB — URINE CULTURE: Culture: NO GROWTH

## 2024-06-17 ENCOUNTER — Other Ambulatory Visit: Payer: Self-pay | Admitting: Family Medicine

## 2024-06-17 DIAGNOSIS — Z1231 Encounter for screening mammogram for malignant neoplasm of breast: Secondary | ICD-10-CM

## 2024-06-17 LAB — CULTURE, BLOOD (ROUTINE X 2)
Culture: NO GROWTH
Special Requests: ADEQUATE

## 2024-07-02 DIAGNOSIS — J069 Acute upper respiratory infection, unspecified: Secondary | ICD-10-CM | POA: Diagnosis not present

## 2024-07-02 DIAGNOSIS — R112 Nausea with vomiting, unspecified: Secondary | ICD-10-CM | POA: Diagnosis not present

## 2024-07-11 DIAGNOSIS — I5022 Chronic systolic (congestive) heart failure: Secondary | ICD-10-CM | POA: Diagnosis not present

## 2024-07-11 DIAGNOSIS — Z95810 Presence of automatic (implantable) cardiac defibrillator: Secondary | ICD-10-CM | POA: Diagnosis not present

## 2024-07-15 ENCOUNTER — Ambulatory Visit
Admission: RE | Admit: 2024-07-15 | Discharge: 2024-07-15 | Disposition: A | Discharge status: Home / Self Care | Source: Ambulatory Visit | Attending: Family Medicine | Admitting: Family Medicine

## 2024-07-15 DIAGNOSIS — K2940 Chronic atrophic gastritis without bleeding: Secondary | ICD-10-CM | POA: Diagnosis not present

## 2024-07-15 DIAGNOSIS — K219 Gastro-esophageal reflux disease without esophagitis: Secondary | ICD-10-CM | POA: Diagnosis not present

## 2024-07-15 DIAGNOSIS — D3A8 Other benign neuroendocrine tumors: Secondary | ICD-10-CM | POA: Diagnosis not present

## 2024-07-15 DIAGNOSIS — C7A092 Malignant carcinoid tumor of the stomach: Secondary | ICD-10-CM | POA: Diagnosis not present

## 2024-07-15 DIAGNOSIS — K529 Noninfective gastroenteritis and colitis, unspecified: Secondary | ICD-10-CM | POA: Diagnosis not present

## 2024-07-15 DIAGNOSIS — R799 Abnormal finding of blood chemistry, unspecified: Secondary | ICD-10-CM | POA: Diagnosis not present

## 2024-07-15 DIAGNOSIS — G893 Neoplasm related pain (acute) (chronic): Secondary | ICD-10-CM | POA: Diagnosis not present

## 2024-07-15 DIAGNOSIS — E890 Postprocedural hypothyroidism: Secondary | ICD-10-CM | POA: Diagnosis not present

## 2024-07-15 DIAGNOSIS — Z1231 Encounter for screening mammogram for malignant neoplasm of breast: Secondary | ICD-10-CM | POA: Insufficient documentation

## 2024-07-15 DIAGNOSIS — D509 Iron deficiency anemia, unspecified: Secondary | ICD-10-CM | POA: Diagnosis not present

## 2024-07-15 DIAGNOSIS — C7A8 Other malignant neuroendocrine tumors: Secondary | ICD-10-CM | POA: Diagnosis not present

## 2024-07-15 DIAGNOSIS — R569 Unspecified convulsions: Secondary | ICD-10-CM | POA: Diagnosis not present

## 2024-07-15 DIAGNOSIS — E876 Hypokalemia: Secondary | ICD-10-CM | POA: Diagnosis not present

## 2024-08-02 ENCOUNTER — Emergency Department

## 2024-08-02 ENCOUNTER — Emergency Department: Admission: EM | Admit: 2024-08-02 | Discharge: 2024-08-02 | Disposition: A | Discharge status: Home / Self Care

## 2024-08-02 ENCOUNTER — Other Ambulatory Visit: Payer: Self-pay

## 2024-08-02 DIAGNOSIS — K760 Fatty (change of) liver, not elsewhere classified: Secondary | ICD-10-CM | POA: Insufficient documentation

## 2024-08-02 DIAGNOSIS — E039 Hypothyroidism, unspecified: Secondary | ICD-10-CM | POA: Diagnosis not present

## 2024-08-02 DIAGNOSIS — E876 Hypokalemia: Secondary | ICD-10-CM | POA: Diagnosis not present

## 2024-08-02 DIAGNOSIS — I509 Heart failure, unspecified: Secondary | ICD-10-CM | POA: Insufficient documentation

## 2024-08-02 DIAGNOSIS — G894 Chronic pain syndrome: Secondary | ICD-10-CM | POA: Diagnosis not present

## 2024-08-02 DIAGNOSIS — G4733 Obstructive sleep apnea (adult) (pediatric): Secondary | ICD-10-CM | POA: Diagnosis not present

## 2024-08-02 DIAGNOSIS — R0602 Shortness of breath: Secondary | ICD-10-CM | POA: Diagnosis not present

## 2024-08-02 DIAGNOSIS — C7A092 Malignant carcinoid tumor of the stomach: Secondary | ICD-10-CM | POA: Diagnosis not present

## 2024-08-02 DIAGNOSIS — R1084 Generalized abdominal pain: Secondary | ICD-10-CM | POA: Insufficient documentation

## 2024-08-02 DIAGNOSIS — I428 Other cardiomyopathies: Secondary | ICD-10-CM | POA: Diagnosis not present

## 2024-08-02 DIAGNOSIS — F1129 Opioid dependence with unspecified opioid-induced disorder: Secondary | ICD-10-CM | POA: Diagnosis not present

## 2024-08-02 DIAGNOSIS — I10 Essential (primary) hypertension: Secondary | ICD-10-CM | POA: Diagnosis not present

## 2024-08-02 DIAGNOSIS — Z0389 Encounter for observation for other suspected diseases and conditions ruled out: Secondary | ICD-10-CM | POA: Diagnosis not present

## 2024-08-02 DIAGNOSIS — K8689 Other specified diseases of pancreas: Secondary | ICD-10-CM | POA: Diagnosis not present

## 2024-08-02 DIAGNOSIS — J45909 Unspecified asthma, uncomplicated: Secondary | ICD-10-CM | POA: Diagnosis not present

## 2024-08-02 DIAGNOSIS — F11288 Opioid dependence with other opioid-induced disorder: Secondary | ICD-10-CM | POA: Diagnosis not present

## 2024-08-02 DIAGNOSIS — D72829 Elevated white blood cell count, unspecified: Secondary | ICD-10-CM | POA: Diagnosis not present

## 2024-08-02 DIAGNOSIS — E538 Deficiency of other specified B group vitamins: Secondary | ICD-10-CM | POA: Diagnosis not present

## 2024-08-02 DIAGNOSIS — R109 Unspecified abdominal pain: Secondary | ICD-10-CM

## 2024-08-02 DIAGNOSIS — R112 Nausea with vomiting, unspecified: Secondary | ICD-10-CM | POA: Insufficient documentation

## 2024-08-02 DIAGNOSIS — J069 Acute upper respiratory infection, unspecified: Secondary | ICD-10-CM | POA: Diagnosis not present

## 2024-08-02 DIAGNOSIS — I5021 Acute systolic (congestive) heart failure: Secondary | ICD-10-CM | POA: Diagnosis not present

## 2024-08-02 DIAGNOSIS — R110 Nausea: Secondary | ICD-10-CM | POA: Diagnosis not present

## 2024-08-02 DIAGNOSIS — R197 Diarrhea, unspecified: Secondary | ICD-10-CM | POA: Diagnosis not present

## 2024-08-02 DIAGNOSIS — F1120 Opioid dependence, uncomplicated: Secondary | ICD-10-CM | POA: Diagnosis not present

## 2024-08-02 LAB — URINALYSIS, ROUTINE W REFLEX MICROSCOPIC
Bacteria, UA: NONE SEEN
Bilirubin Urine: NEGATIVE
Glucose, UA: NEGATIVE mg/dL
Hgb urine dipstick: NEGATIVE
Ketones, ur: NEGATIVE mg/dL
Nitrite: NEGATIVE
Protein, ur: NEGATIVE mg/dL
RBC / HPF: 0 RBC/hpf (ref 0–5)
Specific Gravity, Urine: 1.028 (ref 1.005–1.030)
pH: 5 (ref 5.0–8.0)

## 2024-08-02 LAB — COMPREHENSIVE METABOLIC PANEL WITH GFR
ALT: 16 U/L (ref 0–44)
AST: 28 U/L (ref 15–41)
Albumin: 4.1 g/dL (ref 3.5–5.0)
Alkaline Phosphatase: 94 U/L (ref 38–126)
Anion gap: 11 (ref 5–15)
BUN: 12 mg/dL (ref 6–20)
CO2: 28 mmol/L (ref 22–32)
Calcium: 9 mg/dL (ref 8.9–10.3)
Chloride: 102 mmol/L (ref 98–111)
Creatinine, Ser: 0.92 mg/dL (ref 0.44–1.00)
GFR, Estimated: >60 mL/min (ref >60)
Glucose, Bld: 123 mg/dL — ABNORMAL HIGH (ref 70–99)
Potassium: 3.8 mmol/L (ref 3.5–5.1)
Sodium: 141 mmol/L (ref 135–145)
Total Bilirubin: <0.2 mg/dL (ref 0.0–1.2)
Total Protein: 7.2 g/dL (ref 6.5–8.1)

## 2024-08-02 LAB — CBC
HCT: 31.8 % — ABNORMAL LOW (ref 36.0–46.0)
Hemoglobin: 9.7 g/dL — ABNORMAL LOW (ref 12.0–15.0)
MCH: 24.7 pg — ABNORMAL LOW (ref 26.0–34.0)
MCHC: 30.5 g/dL (ref 30.0–36.0)
MCV: 81.1 fL (ref 80.0–100.0)
Platelets: 267 K/uL (ref 150–400)
RBC: 3.92 MIL/uL (ref 3.87–5.11)
RDW: 18.7 % — ABNORMAL HIGH (ref 11.5–15.5)
WBC: 7.7 K/uL (ref 4.0–10.5)
nRBC: 0 % (ref 0.0–0.2)

## 2024-08-02 LAB — RESP PANEL BY RT-PCR (RSV, FLU A&B, COVID)  RVPGX2
Influenza A by PCR: NEGATIVE
Influenza B by PCR: NEGATIVE
Resp Syncytial Virus by PCR: NEGATIVE
SARS Coronavirus 2 by RT PCR: NEGATIVE

## 2024-08-02 LAB — LIPASE, BLOOD: Lipase: 21 U/L (ref 11–51)

## 2024-08-02 LAB — PRO BRAIN NATRIURETIC PEPTIDE: Pro Brain Natriuretic Peptide: 168 pg/mL (ref <300.0)

## 2024-08-02 MED ORDER — ONDANSETRON HCL 4 MG/2ML IJ SOLN
4.0000 mg | Freq: Once | INTRAMUSCULAR | Status: AC
  Administered 2024-08-02: 4 mg via INTRAVENOUS
  Filled 2024-08-02: qty 2

## 2024-08-02 MED ORDER — OXYCODONE-ACETAMINOPHEN 10-325 MG PO TABS: 1.0000 | ORAL_TABLET | ORAL | 0 refills | Status: AC | PRN

## 2024-08-02 MED ORDER — DIPHENHYDRAMINE HCL 50 MG/ML IJ SOLN
12.5000 mg | Freq: Once | INTRAMUSCULAR | Status: AC
  Administered 2024-08-02: 12.5 mg via INTRAVENOUS
  Filled 2024-08-02: qty 1

## 2024-08-02 MED ORDER — SODIUM CHLORIDE 0.9 % IV BOLUS
1000.0000 mL | Freq: Once | INTRAVENOUS | Status: AC
  Administered 2024-08-02: 1000 mL via INTRAVENOUS

## 2024-08-02 MED ORDER — MORPHINE SULFATE (PF) 4 MG/ML IV SOLN
4.0000 mg | Freq: Once | INTRAVENOUS | Status: AC
  Administered 2024-08-02: 4 mg via INTRAVENOUS
  Filled 2024-08-02: qty 1

## 2024-08-02 NOTE — ED Provider Notes (Signed)
 [facility] Provider Note    Event Date/Time   First MD Initiated Contact with Patient 08/02/24 1350     (approximate)   History   Emesis and Diarrhea   HPI  Brittany Nguyen is a 58 y.o. female with a past medical history of congestive heart failure, GI malignancy (not on current treatment)OSA, obesity, migraines, nonischemic cardiomyopathy, asthma hypothyroidism on methadone  followed by pain medicine who presents today with 2 days of nausea vomiting diarrhea and inability to keep her medications down.  She reports generalized abdominal pain.  Patient has recently been treated for an upper respiratory infection with a Z-Pak 5 days ago.  She saw PA televideo visit her palliative care provider and endorsed shortness of breath and fevers and was told to come to the emergency department specifically for rule out of C. difficile infection.  Patient denies any shortness of breath to me which is different from what was documented previously.  She only reports 2-3 episodes of constipation.  She is unsure when I ask whether she has been having fevers.  Her husband is at bedside and does contribute to this.  On further talking to the patient, she states that she has been out of her methadone  for 10 days and also out of her Percocet.  She and her husband endorse that some days the pain she experiences are worse than others and she does take extra. She states that she does have a plan to obtain refills on Monday but the pharmacy will not give her and only refill.      Physical Exam   Triage Vital Signs: ED Triage Vitals  Encounter Vitals Group     BP 08/02/24 1259 (!) 136/100     Girls Systolic BP Percentile --      Girls Diastolic BP Percentile --      Boys Systolic BP Percentile --      Boys Diastolic BP Percentile --      Pulse Rate 08/02/24 1259 90     Resp 08/02/24 1259 17     Temp 08/02/24 1259 98.5 F (36.9 C)     Temp Source 08/02/24 1259 Oral     SpO2  08/02/24 1259 100 %     Weight 08/02/24 1300 200 lb (90.7 kg)     Height 08/02/24 1300 5' 5 (1.651 m)     Head Circumference --      Peak Flow --      Pain Score 08/02/24 1300 9     Pain Loc --      Pain Education --      Exclude from Growth Chart --     Most recent vital signs: Vitals:   08/02/24 1405 08/02/24 1700  BP:  (!) 149/96  Pulse:  84  Resp:  20  Temp:    SpO2: 100% 100%    Nursing Triage Note reviewed. Vital signs reviewed and patients oxygen  saturation is normoxic  General: Patient is well nourished, well developed, awake and alert, resting comfortably in no acute distress Head: Normocephalic and atraumatic Eyes: Normal inspection, extraocular muscles intact, no conjunctival pallor Ear, nose, throat: Normal external exam Neck: Normal range of motion Respiratory: Patient is in no respiratory distress, lungs CTAB Cardiovascular: Patient is not tachycardic, RRR without murmur appreciated GI: Abd soft, very mild abdominal tenderness to palpation generally with no rebound or guarding Back: Normal inspection of the back with good strength and range of motion throughout all ext Extremities: pulses intact  with good cap refills, no LE pitting edema or calf tenderness Neuro: The patient is alert and oriented to person, place, and time, appropriately conversive, with 5/5 bilat UE/LE strength, no gross motor or sensory defects noted. Coordination appears to be adequate. Skin: Warm, dry, and intact Psych: normal mood and affect, no SI or HI  ED Results / Procedures / Treatments   Labs (all labs ordered are listed, but only abnormal results are displayed) Labs Reviewed  COMPREHENSIVE METABOLIC PANEL WITH GFR - Abnormal; Notable for the following components:      Result Value   Glucose, Bld 123 (*)    All other components within normal limits  CBC - Abnormal; Notable for the following components:   Hemoglobin 9.7 (*)    HCT 31.8 (*)    MCH 24.7 (*)    RDW 18.7 (*)     All other components within normal limits  URINALYSIS, ROUTINE W REFLEX MICROSCOPIC - Abnormal; Notable for the following components:   Color, Urine YELLOW (*)    APPearance HAZY (*)    Leukocytes,Ua TRACE (*)    Non Squamous Epithelial PRESENT (*)    All other components within normal limits  RESP PANEL BY RT-PCR (RSV, FLU A&B, COVID)  RVPGX2  C DIFFICILE QUICK SCREEN W PCR REFLEX    LIPASE, BLOOD  PRO BRAIN NATRIURETIC PEPTIDE     EKG EKG and rhythm strip are interpreted by myself:   EKG: [Normal sinus rhythm] at heart rate of 80, normal QRS duration, QTc 500, nonspecific ST segments and T waves no ectopy EKG not consistent with Acute STEMI Rhythm strip: NSr in lead II   RADIOLOGY CT abd and pelvis without contrast: No acute abnormality on my independent review interpretation and radiologist agrees    PROCEDURES:  Critical Care performed: No  Procedures   MEDICATIONS ORDERED IN ED: Medications  sodium chloride  0.9 % bolus 1,000 mL (0 mLs Intravenous Stopped 08/02/24 1701)  ondansetron  (ZOFRAN ) injection 4 mg (4 mg Intravenous Given 08/02/24 1428)  morphine  (PF) 4 MG/ML injection 4 mg (4 mg Intravenous Given 08/02/24 1428)  diphenhydrAMINE  (BENADRYL ) injection 12.5 mg (12.5 mg Intravenous Given 08/02/24 1507)     IMPRESSION / MDM / ASSESSMENT AND PLAN / ED COURSE                                Differential diagnosis includes, but is not limited to, opioid withdrawal, colitis, electrolyte derangement, UTI, diverticulitis,, anemia  ED course: Patient presents and abdominal exam demonstrates no evidence of peritonitis.  Upon admission and given the concern for opioid withdrawal I did administer IV fluids, Zofran , Benadryl  and morphine  and patient had complete improvement in symptoms.  She had no leukocytosis, no electrolyte derangements no elevated anion gap, no ketones in her urine and no evidence of UTI.  COVID was unremarkable.  I did order a BNP upon review of the  the documentation from the palliative physician telemetry care note from earlier today although patient denied any shortness of breath and was satting 100% on room air.  This was not elevated.  Chest x-ray demonstrated no pleural edema or other abnormality.  Patient has an allergy to contrast and so CT abdomen pelvis was done without contrast which demonstrated no acute abnormality.  Given the palliative care concerns for C. difficile, I did order a C. difficile test however patient has not been able to give a stool sample despite being  in our emergency department for close to 4 hours.  Given that she has no leukocytosis and her symptoms have resolved with all interventions I suspect this is unlikely.  I have low suspicion for opioid withdrawal given the cessation of the patient's symptoms with all medications.  Given that it is a weekend approaching, I have ordered her a course of 8 Percocets to her pharmacy of choice and she was advised that this would not be refilled again and all emergency department.  She will follow-up her first pain specialist and get the repeat methadone  initiated on Monday.  Patient was able to tolerate p.o. and repeat abdominal exam was benign and she ambulated throughout the unit without difficulty.  All questions answered and patient voiced understanding and requested discharge   Clinical Course as of 08/02/24 1922  Fri Aug 02, 2024  1406 WBC: 7.7 Leukocytosis [HD]  1406 Comprehensive metabolic panel(!) No elevation of LFTs [HD]  1414 Potassium: 3.8 Potassium not significantly [HD]  1414 Creatinine: 0.92 No AKI [HD]  1509 Pro Brain Natriuretic Peptide: 168.0 Not significant elevated [HD]  1532 CT ABDOMEN PELVIS WO CONTRAST Unremarkable [HD]  1532 DG Chest 2 View Unremarkable [HD]  1533 Patient sleeping comfortably in bed [HD]  1550 Patient resting comfortably, ambulating to the bathroom for urinalysis [HD]  1634 Urinalysis, Routine w reflex microscopic -Urine, Clean  Catch(!) No acute abnormality [HD]  1639 Patient feels much improved.  Has made contact with her pain doctor.  Feels comfortable going home.  Will provide 8 Percocets to her pharmacy of choice until she can follow-up with her pain specialist.  Has been able to tolerate p.o. and repeat abdominal exam benign.  Despite being in our emergency department for close to 4 hours has not been able to provide a stool sample and I think C. difficile is very unlikely [HD]    Clinical Course User Index [HD] Nicholaus Rolland BRAVO, MD   At time of discharge there is no evidence of acute life, limb, vision, or fertility threat. Patient has stable vital signs, pain is well controlled, patient is ambulatory and p.o. tolerant.  Discharge instructions were completed using the EPIC system. I would refer you to those at this time. All warnings prescriptions follow-up etc. were discussed in detail with the patient. Patient indicates understanding and is agreeable with this plan. All questions answered.  Patient is made aware that they may return to the emergency department for any worsening or new condition or for any other emergency.   -- Risk: 5 This patient has a high risk of morbidity due to further diagnostic testing or treatment. Rationale: This patient's evaluation and management involve a high risk of morbidity due to the potential severity of presenting symptoms, need for diagnostic testing, and/or initiation of treatment that may require close monitoring. The differential includes conditions with potential for significant deterioration or requiring escalation of care. Treatment decisions in the ED, including medication administration, procedural interventions, or disposition planning, reflect this level of risk. COPA: 5 The patient has the following acute or chronic illness/injury that poses a possible threat to life or bodily function: [X] : The patient has a potentially serious acute condition or an acute  exacerbation of a chronic illness requiring urgent evaluation and management in the Emergency Department. The clinical presentation necessitates immediate consideration of life-threatening or function-threatening diagnoses, even if they are ultimately ruled out.   FINAL CLINICAL IMPRESSION(S) / ED DIAGNOSES   Final diagnoses:  Nausea and vomiting, unspecified vomiting type  Abdominal pain, unspecified abdominal location  Opioid dependence with opioid-induced disorder (HCC)     Rx / DC Orders   ED Discharge Orders          Ordered    oxyCODONE -acetaminophen  (PERCOCET) 10-325 MG tablet  Every 4 hours PRN        08/02/24 1642             Note:  This document was prepared using Dragon voice recognition software and may include unintentional dictation errors.   Nicholaus Rolland BRAVO, MD 08/02/24 [phone]

## 2024-08-02 NOTE — ED Triage Notes (Signed)
 Pt c/o vomiting and diarrhea x2 days. Pt has been unable to keep her home medications down. Pt reports 5-6 episodes of emesis in a 24 hour period.

## 2024-08-02 NOTE — Progress Notes (Signed)
 [facility] Follow-up Patient Visit   Date of Consult:  08/02/2024 Primary Diagnosis:  Cancer - GI  CHIEF COMPLAINT   Chief Complaint: symptoms  HISTORY OF PRESENT ILLNESS   Brittany Nguyen is a 58 y.o. female with PMH as below who we are following for symptoms. SOB. PCP gave an abx (Zpak). Fever to 101.4. Cough (yellow mucous). SOB with minimal activity. Dark urine. Vomiting a lot too (started yesterday). + diarrhea (film over the water ). Hx c.diff.   Hands and feet swelling. Hx of CHF (EF 45%). Weight up a few pounds in the last few days.   History of Present Illness Brittany Nguyen is a 58 year old female with congestive heart failure who presents with shortness of breath and gastrointestinal symptoms.  She experiences shortness of breath both at rest and with minimal activity, such as walking through her house or to the restroom. This symptom has persisted despite starting a Z-Pak (azithromycin ) five days ago, with no noticeable improvement.  She has been experiencing a fever, with the highest recorded temperature being 101.79F, which began two days after starting the antibiotic. She also has a productive cough with yellow mucus originating from her chest. No sinus pain is associated with the cough.  She began vomiting last night and has been experiencing frequent episodes since then. Additionally, she reports diarrhea that is described as 'very nasty' and green in color, with a foam-like film on the water  when flushed. These gastrointestinal symptoms started yesterday.  She has a history of congestive heart failure and has a pacemaker. Her last echocardiogram, conducted six months ago, showed an ejection fraction of 45-50%. She has noticed a weight gain of two to three pounds over the past few days, which she manages by taking an extra half of a fluid pill as prescribed.  She has a history of Clostridium difficile infections, having experienced it two or three times  in the past. Her recent blood work, conducted 18 days ago, showed normal kidney function but low potassium and consistently low B12 levels. She has not been receiving B12 injections regularly.   PAST MEDICAL HISTORY   Patient Active Problem List  Diagnosis  . Esophageal reflux  . OSA on CPAP  . Heart palpitations  . Chronic combined systolic and diastolic CHF (congestive heart failure) (CMS/HHS-HCC)  . Postcholecystectomy diarrhea  . Abdominal wall pain in right flank  . Iron  deficiency anemia  . Intractable migraine with aura without status migrainosus  . Chronic diarrhea of unknown origin  . Anemia  . Automatic implantable cardioverter-defibrillator in situ  . Cough, persistent  . History of DVT (deep vein thrombosis)  . Hypokalemia  . Hypotension  . Lump or mass in breast  . NICM (nonischemic cardiomyopathy) (CMS/HHS-HCC)  . Nontoxic multinodular goiter  . Restless legs syndrome  . Coronary artery disease involving native coronary artery of native heart  . Mild intermittent asthma without complication (HHS-HCC)  . 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CMS/HHS-HCC)  . Chronic abdominal pain, unspecified  . Moderate protein-calorie malnutrition (HHS-HCC)  . Neuroendocrine carcinoma (CMS-HCC)   . Hypothyroidism, postsurgical  . Seizure (CMS/HHS-HCC)  . Cancer related pain  . Post-COVID chronic cough  . Breast cancer (CMS/HHS-HCC)  . CHF (congestive heart failure) (CMS/HHS-HCC)  . MRSA (methicillin resistant Staphylococcus aureus)  . Bloody stool  . Nausea and vomiting  . Preoperative evaluation to rule out surgical contraindication  . Anxiety  . Migraine  . Neuroendocrine neoplasm of stomach (CMS-HCC)  .  Neuropathy  . UTI (urinary tract infection)  . Asthma (HHS-HCC)  . Current moderate episode of major depressive disorder (CMS-HCC)  . Depression with anxiety  . Essential hypertension  . Hypocalcemia  . Prolonged QT interval  . Viral syndrome  . Diarrhea  .  Status post total left knee replacement  . Seizures (CMS/HHS-HCC)  . Emotional stress  . A-fib (CMS/HHS-HCC)  . SOB (shortness of breath)  . Sore throat  . Recurrent seizures (CMS/HHS-HCC)  . Normocytic anemia  . Status post total right knee replacement  . Vitamin D  deficiency, unspecified  . Low vitamin B12 level  . Abnormal finding of blood chemistry, unspecified  . Neoplastic malignant related fatigue    SURGICAL HISTORY   Past Surgical History:  Procedure Laterality Date  . CHOLECYSTECTOMY  2005  . COLON SURGERY  2010   sigmoid resection  . CARDIAC SURGERY  03/2017   pacemaker/defibrillator - [employer] at [facility] in [place]  . COLONOSCOPY  02/22/2018   Negative colon biopsy/Diverticulosis/Repeat 29yrs/TKT  . EGD  02/22/2018   Duodenitis/Intestinal metaplasia/Gastritis/neuroendocrine cell hyperplasia/Repeat 91yrs/TKT  . THYROIDECTOMY TOTAL  03/2018   [facility]  . ESOPHAGOGASTRODOUDENOSCOPY W/BIOPSY N/A 03/09/2018   Procedure: ESOPHAGOGASTRODUODENOSCOPY, FLEXIBLE, TRANSORAL; WITH BIOPSY, SINGLE OR MULTIPLE;  Surgeon: Luella Sula Dance, MD;  Location: [facility] ENDO/BRONCH;  Service: Gastroenterology;  Laterality: N/A;  . ESOPHAGOGASTRODOUDENOSCOPY W/BIOPSY N/A 08/24/2018   Procedure: ESOPHAGOGASTRODUODENOSCOPY, FLEXIBLE, TRANSORAL; WITH BIOPSY, SINGLE OR MULTIPLE;  Surgeon: Luella Sula Dance, MD;  Location: [facility] ENDO/BRONCH;  Service: Gastroenterology;  Laterality: N/A;  . CORI W/BIOPSY N/A 02/25/2019   Procedure: EGD/EUS;  Surgeon: Luella Sula Dance, MD;  Location: [facility] ENDO/BRONCH;  Service: Gastroenterology;  Laterality: N/A;  . CORI W/INJECTION N/A 02/25/2019   Procedure: ESOPHAGOGASTRODUODENOSCOPY, FLEXIBLE, TRANSORAL; WITH DIRECTED SUBMUCOSAL INJECTION(S), ANY SUBSTANCE;  Surgeon: Luella Sula Dance, MD;  Location: [facility] ENDO/BRONCH;  Service: Gastroenterology;  Laterality: N/A;  . SURVEILLANCE COLONOSCOPY  N/A 11/01/2019   Procedure: COLONOSCOPY/ UPPER EUS;  Surgeon: Luella Sula Dance, MD;  Location: [facility] OR;  Service: Gastroenterology;  Laterality: N/A;  . ESOPHAGOGASTRODOUDENOSCOPY W/ULTRASOUND EXAMINATION N/A 11/01/2019   Procedure: UPPER EUS; ESOPHAGOGASTRODUODENOSCOPY, FLEXIBLE, TRANSORAL; W/ ENDOSCOPIC ULTRASOUND EXAM, ESOPHAGUS, STOMACH, DUODENUM OR SURGICALLY ALTERED STOMACH WHERE JEJUNUM IS EXAMINED DISTAL TO ANASTOMOSIS;  Surgeon: Luella Sula Dance, MD;  Location: [facility] OR;  Service: Gastroenterology;  Laterality: N/A;  . COLONOSCOPY W/BIOPSY N/A 12/13/2019   Procedure: COLONOSCOPY, FLEXIBLE; WITH BIOPSY, SINGLE OR MULTIPLE;  Surgeon: Luella Sula Dance, MD;  Location: [facility] ENDO/BRONCH;  Service: Gastroenterology;  Laterality: N/A;  . ESOPHAGOGASTRODOUDENOSCOPY W/BIOPSY N/A 12/13/2019   Procedure: ESOPHAGOGASTRODUODENOSCOPY, FLEXIBLE, TRANSORAL; WITH BIOPSY, SINGLE OR MULTIPLE;  Surgeon: Luella Sula Dance, MD;  Location: [facility] ENDO/BRONCH;  Service: Gastroenterology;  Laterality: N/A;  . ESOPHAGOGASTRODOUDENOSCOPY W/BIOPSY N/A 11/29/2021   Procedure: UPPER EUS;  Surgeon: Luella Sula Dance, MD;  Location: [facility] ENDO/BRONCH;  Service: Gastroenterology;  Laterality: N/A;  . COLONOSCOPY W/BIOPSY N/A 05/25/2022   Procedure: COLONOSCOPY, FLEXIBLE; WITH BIOPSY, SINGLE OR MULTIPLE;  Surgeon: Melda Greig Norris, MD;  Location: [facility] ENDO/BRONCH;  Service: Gastroenterology;  Laterality: N/A;  . Left total knee arthroplasty using computer-assisted navigation  08/24/2022   Dr Mardee  . Right total knee arthroplasty using computer-assisted navigation  12/01/2023   Dr Mardee  . CESAREAN SECTION    . COLONOSCOPY    . HYSTERECTOMY    . INSERT / REPLACE / REMOVE PACEMAKER      ALLERGIES   Iodinated contrast media; Iron  sucrose; Penicillins; Latex, natural rubber; Lidocaine ; Sacubitril-valsartan;  Budesonide ; Hydromorphone ; Imdur [isosorbide mononitrate]; Ondansetron ;  Povidone-iodine ; and Tizanidine    MEDICATIONS   Current Outpatient Medications on File Prior to Visit  Medication Sig Dispense Refill  . albuterol  (PROVENTIL ) 2.5 mg /3 mL (0.083 %) nebulizer solution Take 3 mLs (2.5 mg total) by nebulization every 6 (six) hours as needed for Wheezing 75 mL 12  . aspirin  81 MG chewable tablet Take 81 mg by mouth once daily    . beclomethasone dipropionate  (QVAR  REDIHALER HFA) 80 mcg/actuation inhaler Inhale 1 inhalation into the lungs as needed       . blood glucose diagnostic test strip 1 each (1 strip total) 3 (three) times daily Use as instructed. 100 each 12  . blood glucose meter kit as directed 1 each 1  . celecoxib  (CELEBREX ) 200 MG capsule Take 200 mg by mouth 2 (two) times daily (Patient not taking: Reported on 02/09/2024)    . chlorhexidine  (HIBICLENS ) 4 % external wash Apply 1 Application topically as directed (topically as directed for 30 doses. Use as directed daily for 5 days every other week for 6 weeks.) (Patient not taking: Reported on 02/09/2024)    . cholecalciferol, vitamin D3, 1,250 mcg (50,000 unit) Tab Take 1 tablet (50,000 Units total) by mouth once a week for 12 doses (Patient not taking: Reported on 07/15/2024) 8 tablet 0  . cyanocobalamin  (VITAMIN B12) 1,000 mcg/mL injection Inject 1 mL (1,000 mcg total) into the muscle monthly (Patient not taking: Reported on 06/11/2024) 10 mL 1  . cyclobenzaprine  (FLEXERIL ) 5 MG tablet Take 1 tablet (5 mg total) by mouth 3 (three) times daily as needed for Muscle spasms (Patient not taking: Reported on 06/11/2024) 60 tablet 1  . ergocalciferol , vitamin D2, 1,250 mcg (50,000 unit) capsule Take 1 capsule (50,000 Units total) by mouth once a week for 60 days 8 capsule 0  . escitalopram  oxalate (LEXAPRO ) 10 MG tablet TAKE ONE TABLET BY MOUTH DAILY AT 9AM 30 tablet 11  . FUROsemide  (LASIX ) 40 MG tablet Take 80 mg by mouth 2 (two) times daily    . gabapentin  (NEURONTIN ) 300 MG capsule TAKE ONE CAPSULE (300 MG  TOTAL) BY MOUTH TWICE DAILY @9AM -5PM 180 capsule 11  . HYDROcodone -homatropine (HYCODAN) 5-1.5 mg/5 mL syrup Take 5 mLs by mouth every 6 (six) hours as needed for Cough (Patient not taking: Reported on 07/15/2024) 120 mL 0  . hydrOXYzine  HCL (ATARAX ) 25 MG tablet Take 1 tablet (25 mg total) by mouth 3 (three) times daily as needed for Anxiety 90 tablet 6  . insulin  syringe-needle U-100 1 mL 30 gauge x 1/2 syringe Use as directed 100 each 1  . lacosamide  (VIMPAT ) 100 mg tablet Take 2 tablets (200 mg total) by mouth every 12 (twelve) hours 120 tablet 4  . lancets Use 1 each 3 (three) times daily Use as instructed. 100 each 12  . levothyroxine  (SYNTHROID ) 200 MCG tablet Take 1 tablet (200 mcg total) by mouth once daily Take on an empty stomach with a glass of water  at least 30-60 minutes before breakfast. 30 tablet 11  . levothyroxine  (SYNTHROID ) 25 MCG tablet Take 1 tablet (25 mcg total) by mouth once daily Take on an empty stomach with a glass of water  at least 30-60 minutes before breakfast. (Patient not taking: Reported on 06/11/2024) 90 tablet 1  . liothyronine (CYTOMEL) 25 MCG tablet Take on an empty stomach with a glass of water  at least 30-60 minutes before breakfast once daily WITH her levothyroxine  every morning. 30 tablet 11  .  LORazepam  (ATIVAN ) 1 MG tablet Take 1 tablet (1 mg total) by mouth 2 (two) times daily as needed 60 tablet 0  . losartan  (COZAAR ) 25 MG tablet Take 12.5 mg by mouth 2 (two) times daily    . magnesium  oxide (MAG-OX) 400 mg (241.3 mg magnesium ) tablet Take 400 mg by mouth once daily    . melatonin 3 mg Cap Take 1 capsule by mouth at bedtime    . methadone  (DOLOPHINE ) 10 MG tablet 1 tab in am (10mg ); 1 tab at midday (10mg ); 1.5 tabs at night (15mg ) 105 tablet 0  . methadone  (DOLOPHINE ) 10 MG tablet 1 tab in am (10mg ); 1 tab at midday (10mg ); 1.5 tabs at night (15mg ) (Patient not taking: Reported on 06/11/2024) 105 tablet 0  . methadone  (DOLOPHINE ) 10 MG tablet 1 tab in am  (10mg ); 1 tab at midday (10mg ); 1.5 tabs at night (15mg ) 105 tablet 0  . metoprolol  succinate (TOPROL -XL) 50 MG XL tablet Take 50 mg by mouth 2 (two) times daily    . naloxone  (NARCAN ) 4 mg/actuation nasal spray Place 1 spray (4 mg total) into one nostril once as needed for up to 1 dose For somnolence, decrease respiratory rate due opioid use. 1 each 0  . nitroGLYcerin  (NITROSTAT ) 0.4 MG SL tablet Place 0.4 mg under the tongue every 5 (five) minutes as needed for Chest pain May take up to 3 doses.    . oxyCODONE -acetaminophen  (PERCOCET) 10-325 mg tablet Take 1 tablet by mouth every 4 (four) hours as needed for Pain for up to 30 days (Patient not taking: Reported on 06/11/2024) 180 tablet 0  . oxyCODONE -acetaminophen  (PERCOCET) 10-325 mg tablet Take 1 tablet by mouth every 4 (four) hours as needed for Pain for up to 30 days (Patient not taking: Reported on 06/11/2024) 180 tablet 0  . oxyCODONE -acetaminophen  (PERCOCET) 10-325 mg tablet Take 1 tablet by mouth every 4 (four) hours as needed for Pain for up to 30 days 180 tablet 0  . potassium chloride  (KLOR-CON ) 20 MEQ ER tablet Take 20 mEq by mouth 2 (two) times daily    . promethazine  (PHENERGAN ) 25 MG tablet Take 1 tablet (25 mg total) by mouth every 6 (six) hours as needed for Nausea 120 tablet 0  . rOPINIRole  (REQUIP ) 4 MG immediate release tablet TAKE ONE TABLET BY MOUTH DAILY AT 9PM EVERY NIGHT 90 tablet 3  . spironolactone  (ALDACTONE ) 50 MG tablet Take by mouth    . tiZANidine  (ZANAFLEX ) 4 MG tablet Take 1 tablet (4 mg total) by mouth every 8 (eight) hours as needed for Muscle spasms 30 tablet 0  . tiZANidine  (ZANAFLEX ) 4 MG tablet Take 1 tablet (4 mg total) by mouth every 8 (eight) hours as needed for Muscle spasms 30 tablet 0  . TORsemide  (DEMADEX ) 20 MG tablet Take 40 mg by mouth 2 (two) times daily (Patient not taking: Reported on 02/09/2024)    . traZODone  (DESYREL ) 50 MG tablet Take 1 tablet (50 mg total) by mouth at bedtime May titrate up to  150mg  PRN 60 tablet 5   No current facility-administered medications on file prior to visit.    FAMILY HISTORY   Family History  Problem Relation Name Age of Onset  . Anesthesia problems Mother         cardiac arrest during heart catheterization  . Myocardial Infarction (Heart attack) Mother    . Diabetes type II Mother    . Diabetes Mother    . Anesthesia problems Father  slow to wake postop, awareness under anesthesia  . Myocardial Infarction (Heart attack) Father    . Diabetes type II Father    . Colon cancer Father    . Prostate cancer Father    . Asthma Father    . Brain cancer Father    . Diabetes Father    . Migraines Father    . Diabetes type II Sister    . Diabetes Sister    . Liver disease Sister    . Diabetes type II Sister    . Diabetes type II Sister    . Cancer Other grandparents   . Other Other siblings        bleeding problems  . Coronary Artery Disease (Blocked arteries around heart) Other    . Ulcers Other    . Malignant hypertension Neg Hx    . Malignant hyperthermia Neg Hx    . Pseudochol deficiency Neg Hx    . PONV Neg Hx       SOCIAL HISTORY   Social History   Socioeconomic History  . Marital status: Married    Spouse name: Elsie  . Number of children: 2  . Years of education: 20  . Highest education level: Master's degree (e.g., MA, MS, MEng, MEd, MSW, MBA)  Occupational History  . Occupation: Disabled- Presenter, [employer]  Tobacco Use  . Smoking status: Never  . Smokeless tobacco: Never  Vaping Use  . Vaping status: Never Used  Substance and Sexual Activity  . Alcohol use: No  . Drug use: Never  . Sexual activity: Defer    Partners: Male   Social Drivers of Health   Financial Resource Strain: High Risk (11/24/2023)   Overall Financial Resource Strain (CARDIA)   . Difficulty of Paying Living Expenses: Hard  Food Insecurity: Food Insecurity Present (03/28/2024)   Received from [facility]   Hunger Vital Sign   . Within  the past 12 months, you worried that your food would run out before you got the money to buy more.: Sometimes true   . Within the past 12 months, the food you bought just didn't last and you didn't have money to get more.: Sometimes true  Transportation Needs: No Transportation Needs (03/28/2024)   Received from [facility] - Transportation   . In the past 12 months, has lack of transportation kept you from medical appointments or from getting medications?: No   . In the past 12 months, has lack of transportation kept you from meetings, work, or from getting things needed for daily living?: No    REVIEW OF SYSTEMS   All systems negative except as delineated above in HPI.  PHYSICAL EXAMINATION   LMP  (LMP Unknown) Comment: Hysterectomy  Wt Readings from Last 10 Encounters:  07/15/24 (!) 101.3 kg (223 lb 5.2 oz)  07/02/24 (!) 106.5 kg (234 lb 12.8 oz)  06/11/24 98.1 kg (216 lb 3.2 oz)  04/05/24 (!) 103.2 kg (227 lb 8.2 oz)  04/02/24 98.9 kg (218 lb)  02/09/24 97.9 kg (215 lb 12.8 oz)  02/05/24 98.3 kg (216 lb 11.4 oz)  01/16/24 97.7 kg (215 lb 6.4 oz)  12/18/23 (!) 100.8 kg (222 lb 3.2 oz)  11/29/23 97.3 kg (214 lb 9.6 oz)      Gen: NAD, sitting in chair, appears uncomfortable and ill Eyes: EOMI, Sclera anicteric without conjunctivitis or discharge from eyes ENMT: MMM Resp: no increased WOB Skin: No rashes on visible skin MSK: no sarcopenia Neuro: grossly  nonfocal, moves all 4 extremities. Psych: midly anxious affect   LABS AND STUDIES   Labs:   Lab Results  Component Value Date   WBC 7.8 07/15/2024   HGB 10.3 (L) 07/15/2024   HCT 34.0 (L) 07/15/2024   PLT 253 07/15/2024   Lab Results  Component Value Date   NA 139 07/15/2024   K 3.1 (L) 07/15/2024   CL 97 (L) 07/15/2024   CO2 30 07/15/2024   BUN 10 07/15/2024   CREATININE 1.0 07/15/2024   GLUCOSE 146 (H) 07/15/2024   Lab Results  Component Value Date   AST 41 07/15/2024   ALT 20 07/15/2024    ALKPHOS 79 07/15/2024   TBILI 0.6 07/15/2024   CONJBILI <0.1 (L) 06/30/2020   ALB 4.0 07/15/2024   TOTALPROTEIN 7.9 07/15/2024    Imaging: - No results found.  Palliative Performance Scale: 70% - Reduced ambulation, unable to perform normal job/work, significant disease, occasionally needs assistance with self-care, normal or reduced intake, full consciousness or confusion  There are no diagnoses linked to this encounter.  IMPRESSION     Brittany Nguyen is a 58 y.o. 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  Palliative Care Domains Evaluated Today: Pain Management and Other: Medical illness  PLAN   NOTE FOR URGENT CARE PROVIDER:   Shanera does not look well today.  Her oxygen  saturation is normal.  My history is in the HPI section above.  Clinically she does not look sick enough that she needs to be in the emergency department at this point.  Differential diagnosis in my mind is pulmonary infection, CHF exacerbation, and/or infectious diarrhea.  I do not have an Ship Broker diagnosis here.  What I suspect is that she had a bronchitis which tipped her a bit into CHF exacerbation and the Z-Pak is causing the diarrhea.  She is getting worse and not better.  To help sort through this, I would recommend the following: -Chest x-ray to look for consolidation -Physical exam for JVP for volume status and BNP  -Stool study for C. difficile toxin and fecal fat - CBC/CMP/TSH Assessment & Plan Acute respiratory infection with cough, fever, and shortness of breath (possible pneumonia or bronchitis) Persistent symptoms despite azithromycin . Differential includes pneumonia or bronchitis. Oxygen  saturation 92-94%. - Visit urgent care for chest x-ray and blood work. - Continue azithromycin  until further evaluation.  Diarrhea and vomiting, possible antibiotic-associated or Clostridioides difficile infection Possible antibiotic-associated  diarrhea or C. diff infection. Recurrent C. diff infections. - Visit urgent care for stool study for Clostridioides difficile.  Congestive heart failure with mildly reduced ejection fraction Ejection fraction 45-50%. Recent weight gain. Shortness of breath possibly due to CHF exacerbation or infection. - Monitor weight and fluid status. - Continue diuretics as needed.  Hypokalemia Previous low potassium levels contributing to malaise. - Continue potassium supplementation.  Vitamin B12 deficiency Chronic low B12 likely due to decreased intrinsic factor absorption. - Contact primary care for monthly B12 injections.  Last 2 have been low.  I suspect she is lacking intrinsic factor.  Chronic pain  We are not going to change chronic medications in the middle of an acute illness.    RTC: We will talk next week.   Any controlled substances utilized were prescribed in the context of palliative care.   TELEMEDICINE VISIT This video encounter was conducted with the patient's (or proxy's) verbal consent via secure, interactive audio and video  telecommunications while in clinic/office/hospital.  The patient (or proxy) was instructed to have this encounter in a suitably private space and to only have persons present to whom they give permission to participate. In addition, patient identity was confirmed by use of name plus an additional identifier.  This visit was coded based on medical decision making (MDM).   Medical Decision Making: Risk: HIGH - decision regarding hospitalization (send patient to urgent care for evaluation)  Number and complexity of problems addressed: HIGH - 1 or more chronic illnesses with SEVERE exacerbation, progression, or side effects of treatment - she does not look great.  I think this is a severe enough exacerbation that she needs to be checked out in person.  Data reviewed: HIGH = 2 of 3 from CAT 1 - CAT 3 below, ----------------------------------, CAT 1 -  Tests, documents, or independent historians (any 3 items from A-D), A) Review of results of each unique test - CBC (Hgb), BMP (Cr), LFT (Alb), D) Review of prior external note(s) from each unique source - Specialist notes from oncology, primary care, --------------------------------, CAT 2 - Independent interpretation of tests (any 1), and Independent interpretation of a test performed by another physician/PHCP - EKG normal sinus rhythm, QTc 526  This visit represents ongoing and longitudinal specialty care I am providing to the patient. Will add CPT code G2211.  Brittany RONAL Molt, MD, MBA  , Thank you for the opportunity to participate in the care of your patient. Please feel free to contact me with questions via [email or web address] paging or through [employer].  Future Appointments     Date/Time Provider Department Center Visit Type   08/12/2024 9:30 AM [facility] CANCER CENTER LAB FLOOR 2 [facility] [place] [facility]   08/12/2024 11:20 AM (Arrive by 10:50 AM) [facility] ONC INJ/LAB [facility] [address] ONCOLOGY INJECTION   08/14/2024 8:30 AM [facility] Brittany Dawn, MD [facility] Pall[facility] 00 [facility] Cancer Ctr VIDEO VISIT RETURN   09/09/2024 10:00 AM [facility] CANCER CENTER LAB FLOOR 2 [facility] [place] [facility]   09/09/2024 12:00 PM (Arrive by 11:30 AM) [facility] ONC INJ/LAB [facility] [address] ONCOLOGY INJECTION   10/07/2024 9:40 AM (Arrive by 9:10 AM) [facility] ONC INJ/LAB [facility] [address] LAB   10/07/2024 10:30 AM (Arrive by 10:00 AM) Elouise Casimir Joy, MD [facility] Hematology Oncology [address] RETURN VISIT   10/07/2024 11:20 AM (Arrive by 10:50 AM) [facility] ONC INJ/LAB [facility] [address] ONCOLOGY INJECTION      Patient Care Team: Valora Lynwood FALCON, MD as PCP - General (Family Medicine) [place] - SOFIA as PCP - Cheron Charma Nguyen, Brittany Dawn, MD as  Consulting Provider [facility] and Palliative Medicine)

## 2024-08-02 NOTE — Discharge Instructions (Signed)
 Your workup today was reassuring and you are safe to return home.  Please contact your pain doctor on Monday and discuss further pain control.  Stick to bland fluids in the interim.  Please return with any acutely worsening symptoms. -- RETURN PRECAUTIONS & AFTERCARE: (ENGLISH) RETURN PRECAUTIONS: Return immediately to the emergency department or see/call your doctor if you feel worse, weak or have changes in speech or vision, are short of breath, have fever, vomiting, pain, bleeding or dark stool, trouble urinating or any new issues. Return here or see/call your doctor if not improving as expected for your suspected condition. FOLLOW-UP CARE: Call your doctor and/or any doctors we referred you to for more advice and to make an appointment. Do this today, tomorrow or after the weekend. Some doctors only take PPO insurance so if you have HMO insurance you may want to contact your HMO or your regular doctor for referral to a specialist within your plan. Either way tell the doctor's office that it was a referral from the emergency department so you get the soonest possible appointment.  YOUR TEST RESULTS: Take result reports of any blood or urine tests, imaging tests and EKG's to your doctor and any referral doctor. Have any abnormal tests repeated. Your doctor or a referral doctor can let you know when this should be done. Also make sure your doctor contacts this hospital to get any test results that are not currently available such as cultures or special tests for infection and final imaging reports, which are often not available at the time you leave the ER but which may list additional important findings that are not documented on the preliminary report. BLOOD PRESSURE: If your blood pressure was greater than 120/80 have your blood pressure rechecked within 1 to 2 weeks. MEDICATION SIDE EFFECTS: Do not drive, walk, bike, take the bus, etc. if you have received or are being prescribed any sedating medications  such as those for pain or anxiety or certain antihistamines like Benadryl . If you have been give one of these here get a taxi home or have a friend drive you home. Ask your pharmacist to counsel you on potential side effects of any new medication

## 2024-08-14 DIAGNOSIS — C50919 Malignant neoplasm of unspecified site of unspecified female breast: Secondary | ICD-10-CM | POA: Diagnosis not present

## 2024-08-14 DIAGNOSIS — Z515 Encounter for palliative care: Secondary | ICD-10-CM | POA: Diagnosis not present

## 2024-08-14 DIAGNOSIS — C7A092 Malignant carcinoid tumor of the stomach: Secondary | ICD-10-CM | POA: Diagnosis not present

## 2024-08-14 DIAGNOSIS — F419 Anxiety disorder, unspecified: Secondary | ICD-10-CM | POA: Diagnosis not present

## 2024-08-14 DIAGNOSIS — R457 State of emotional shock and stress, unspecified: Secondary | ICD-10-CM | POA: Diagnosis not present

## 2024-08-31 ENCOUNTER — Other Ambulatory Visit: Payer: Self-pay

## 2024-08-31 ENCOUNTER — Emergency Department

## 2024-08-31 ENCOUNTER — Observation Stay
Admission: EM | Admit: 2024-08-31 | Discharge: 2024-09-01 | Disposition: A | Discharge status: Home / Self Care | Attending: Internal Medicine | Admitting: Internal Medicine

## 2024-08-31 DIAGNOSIS — R0902 Hypoxemia: Secondary | ICD-10-CM

## 2024-08-31 DIAGNOSIS — R0602 Shortness of breath: Secondary | ICD-10-CM | POA: Diagnosis present

## 2024-08-31 DIAGNOSIS — Z96653 Presence of artificial knee joint, bilateral: Secondary | ICD-10-CM | POA: Insufficient documentation

## 2024-08-31 DIAGNOSIS — F1190 Opioid use, unspecified, uncomplicated: Secondary | ICD-10-CM | POA: Diagnosis not present

## 2024-08-31 DIAGNOSIS — I5022 Chronic systolic (congestive) heart failure: Secondary | ICD-10-CM | POA: Diagnosis not present

## 2024-08-31 DIAGNOSIS — J45909 Unspecified asthma, uncomplicated: Secondary | ICD-10-CM | POA: Insufficient documentation

## 2024-08-31 DIAGNOSIS — E119 Type 2 diabetes mellitus without complications: Secondary | ICD-10-CM | POA: Insufficient documentation

## 2024-08-31 DIAGNOSIS — Z8679 Personal history of other diseases of the circulatory system: Secondary | ICD-10-CM

## 2024-08-31 DIAGNOSIS — C7A092 Malignant carcinoid tumor of the stomach: Secondary | ICD-10-CM | POA: Insufficient documentation

## 2024-08-31 DIAGNOSIS — E876 Hypokalemia: Secondary | ICD-10-CM | POA: Insufficient documentation

## 2024-08-31 DIAGNOSIS — Z6833 Body mass index (BMI) 33.0-33.9, adult: Secondary | ICD-10-CM | POA: Insufficient documentation

## 2024-08-31 DIAGNOSIS — E039 Hypothyroidism, unspecified: Secondary | ICD-10-CM | POA: Diagnosis not present

## 2024-08-31 DIAGNOSIS — Z853 Personal history of malignant neoplasm of breast: Secondary | ICD-10-CM | POA: Diagnosis not present

## 2024-08-31 DIAGNOSIS — D3A092 Benign carcinoid tumor of the stomach: Secondary | ICD-10-CM

## 2024-08-31 DIAGNOSIS — Z23 Encounter for immunization: Secondary | ICD-10-CM | POA: Insufficient documentation

## 2024-08-31 DIAGNOSIS — E66811 Obesity, class 1: Secondary | ICD-10-CM | POA: Diagnosis not present

## 2024-08-31 DIAGNOSIS — E669 Obesity, unspecified: Secondary | ICD-10-CM | POA: Diagnosis present

## 2024-08-31 DIAGNOSIS — J9601 Acute respiratory failure with hypoxia: Secondary | ICD-10-CM | POA: Diagnosis present

## 2024-08-31 DIAGNOSIS — Z79899 Other long term (current) drug therapy: Secondary | ICD-10-CM | POA: Diagnosis not present

## 2024-08-31 DIAGNOSIS — I2510 Atherosclerotic heart disease of native coronary artery without angina pectoris: Secondary | ICD-10-CM | POA: Insufficient documentation

## 2024-08-31 DIAGNOSIS — F419 Anxiety disorder, unspecified: Secondary | ICD-10-CM | POA: Diagnosis not present

## 2024-08-31 DIAGNOSIS — J441 Chronic obstructive pulmonary disease with (acute) exacerbation: Principal | ICD-10-CM | POA: Insufficient documentation

## 2024-08-31 LAB — CBC
HCT: 29.7 % — ABNORMAL LOW (ref 36.0–46.0)
HCT: 31.3 % — ABNORMAL LOW (ref 36.0–46.0)
Hemoglobin: 9.3 g/dL — ABNORMAL LOW (ref 12.0–15.0)
Hemoglobin: 9.6 g/dL — ABNORMAL LOW (ref 12.0–15.0)
MCH: 24.7 pg — ABNORMAL LOW (ref 26.0–34.0)
MCH: 24.2 pg — ABNORMAL LOW (ref 26.0–34.0)
MCHC: 31.3 g/dL (ref 30.0–36.0)
MCHC: 30.7 g/dL (ref 30.0–36.0)
MCV: 79 fL — ABNORMAL LOW (ref 80.0–100.0)
MCV: 79 fL — ABNORMAL LOW (ref 80.0–100.0)
Platelets: 237 K/uL (ref 150–400)
Platelets: 251 K/uL (ref 150–400)
RBC: 3.76 MIL/uL — ABNORMAL LOW (ref 3.87–5.11)
RBC: 3.96 MIL/uL (ref 3.87–5.11)
RDW: 18.4 % — ABNORMAL HIGH (ref 11.5–15.5)
RDW: 18.2 % — ABNORMAL HIGH (ref 11.5–15.5)
WBC: 7.6 K/uL (ref 4.0–10.5)
WBC: 7.6 K/uL (ref 4.0–10.5)
nRBC: 0 % (ref 0.0–0.2)
nRBC: 0 % (ref 0.0–0.2)

## 2024-08-31 LAB — RESPIRATORY PANEL BY PCR

## 2024-08-31 LAB — RESP PANEL BY RT-PCR (RSV, FLU A&B, COVID)  RVPGX2
Influenza A by PCR: NEGATIVE
Influenza B by PCR: NEGATIVE
Resp Syncytial Virus by PCR: NEGATIVE
SARS Coronavirus 2 by RT PCR: NEGATIVE

## 2024-08-31 LAB — CREATININE, SERUM
Creatinine, Ser: 0.92 mg/dL (ref 0.44–1.00)
GFR, Estimated: >60 mL/min

## 2024-08-31 LAB — COMPREHENSIVE METABOLIC PANEL WITH GFR
ALT: 14 U/L (ref 0–44)
AST: 28 U/L (ref 15–41)
Albumin: 4 g/dL (ref 3.5–5.0)
Alkaline Phosphatase: 106 U/L (ref 38–126)
Anion gap: 11 (ref 5–15)
BUN: 7 mg/dL (ref 6–20)
CO2: 32 mmol/L (ref 22–32)
Calcium: 8.8 mg/dL — ABNORMAL LOW (ref 8.9–10.3)
Chloride: 97 mmol/L — ABNORMAL LOW (ref 98–111)
Creatinine, Ser: 0.87 mg/dL (ref 0.44–1.00)
GFR, Estimated: >60 mL/min
Glucose, Bld: 136 mg/dL — ABNORMAL HIGH (ref 70–99)
Potassium: 3.1 mmol/L — ABNORMAL LOW (ref 3.5–5.1)
Sodium: 140 mmol/L (ref 135–145)
Total Bilirubin: 0.3 mg/dL (ref 0.0–1.2)
Total Protein: 7.1 g/dL (ref 6.5–8.1)

## 2024-08-31 LAB — HIV ANTIBODY (ROUTINE TESTING W REFLEX): HIV Screen 4th Generation wRfx: NONREACTIVE

## 2024-08-31 LAB — MAGNESIUM: Magnesium: 2.2 mg/dL (ref 1.7–2.4)

## 2024-08-31 LAB — GLUCOSE, CAPILLARY
Glucose-Capillary: 220 mg/dL — ABNORMAL HIGH (ref 70–99)
Glucose-Capillary: 191 mg/dL — ABNORMAL HIGH (ref 70–99)

## 2024-08-31 LAB — PRO BRAIN NATRIURETIC PEPTIDE: Pro Brain Natriuretic Peptide: 106 pg/mL

## 2024-08-31 LAB — TROPONIN T, HIGH SENSITIVITY
Troponin T High Sensitivity: 23 ng/L — ABNORMAL HIGH (ref 0–19)
Troponin T High Sensitivity: 20 ng/L — ABNORMAL HIGH (ref 0–19)

## 2024-08-31 MED ORDER — ESCITALOPRAM OXALATE 10 MG PO TABS
10.0000 mg | ORAL_TABLET | Freq: Every morning | ORAL | Status: DC
  Administered 2024-08-31 – 2024-09-01 (×2): 10 mg via ORAL
  Filled 2024-08-31 (×2): qty 1

## 2024-08-31 MED ORDER — FAMOTIDINE IN NACL 20-0.9 MG/50ML-% IV SOLN
20.0000 mg | Freq: Once | INTRAVENOUS | Status: AC
  Administered 2024-08-31: 20 mg via INTRAVENOUS
  Filled 2024-08-31: qty 50

## 2024-08-31 MED ORDER — DOXYCYCLINE HYCLATE 100 MG PO TABS
100.0000 mg | ORAL_TABLET | Freq: Two times a day (BID) | ORAL | Status: DC
  Administered 2024-08-31 – 2024-09-01 (×3): 100 mg via ORAL
  Filled 2024-08-31 (×3): qty 1

## 2024-08-31 MED ORDER — GABAPENTIN 300 MG PO CAPS
300.0000 mg | ORAL_CAPSULE | Freq: Three times a day (TID) | ORAL | Status: DC
  Administered 2024-08-31 – 2024-09-01 (×3): 300 mg via ORAL
  Filled 2024-08-31 (×3): qty 1

## 2024-08-31 MED ORDER — MORPHINE SULFATE (PF) 4 MG/ML IV SOLN
4.0000 mg | Freq: Once | INTRAVENOUS | Status: AC
  Administered 2024-08-31: 4 mg via INTRAVENOUS
  Filled 2024-08-31: qty 1

## 2024-08-31 MED ORDER — LEVOTHYROXINE SODIUM 100 MCG PO TABS: 200.0000 ug | ORAL_TABLET | Freq: Every day | ORAL | Status: DC

## 2024-08-31 MED ORDER — SPIRONOLACTONE 25 MG PO TABS
50.0000 mg | ORAL_TABLET | Freq: Two times a day (BID) | ORAL | Status: DC
  Administered 2024-08-31 – 2024-09-01 (×2): 50 mg via ORAL
  Filled 2024-08-31 (×2): qty 2

## 2024-08-31 MED ORDER — PREDNISONE 20 MG PO TABS: 40.0000 mg | ORAL_TABLET | Freq: Every day | ORAL | Status: DC

## 2024-08-31 MED ORDER — LEVOTHYROXINE SODIUM 25 MCG PO TABS
225.0000 ug | ORAL_TABLET | Freq: Every day | ORAL | Status: DC
  Administered 2024-09-01: 225 ug via ORAL
  Filled 2024-08-31: qty 1

## 2024-08-31 MED ORDER — IPRATROPIUM-ALBUTEROL 0.5-2.5 (3) MG/3ML IN SOLN
6.0000 mL | Freq: Once | RESPIRATORY_TRACT | Status: AC
  Administered 2024-08-31: 6 mL via RESPIRATORY_TRACT
  Filled 2024-08-31: qty 6

## 2024-08-31 MED ORDER — ACETAMINOPHEN 325 MG PO TABS: 650.0000 mg | ORAL_TABLET | Freq: Four times a day (QID) | ORAL | Status: DC | PRN

## 2024-08-31 MED ORDER — SODIUM CHLORIDE 0.9% FLUSH: 3.0000 mL | INTRAVENOUS | Status: DC | PRN

## 2024-08-31 MED ORDER — OXYCODONE-ACETAMINOPHEN 5-325 MG PO TABS
1.0000 | ORAL_TABLET | ORAL | Status: DC | PRN
  Administered 2024-08-31 – 2024-09-01 (×2): 1 via ORAL
  Filled 2024-08-31 (×2): qty 1

## 2024-08-31 MED ORDER — SODIUM CHLORIDE 0.9 % IV SOLN: 250.0000 mL | INTRAVENOUS | Status: AC | PRN

## 2024-08-31 MED ORDER — METHADONE HCL 5 MG PO TABS
10.0000 mg | ORAL_TABLET | Freq: Two times a day (BID) | ORAL | Status: DC
  Administered 2024-08-31 – 2024-09-01 (×2): 10 mg via ORAL
  Filled 2024-08-31 (×2): qty 2

## 2024-08-31 MED ORDER — IPRATROPIUM-ALBUTEROL 0.5-2.5 (3) MG/3ML IN SOLN: 3.0000 mL | RESPIRATORY_TRACT | Status: DC | PRN

## 2024-08-31 MED ORDER — LACOSAMIDE 50 MG PO TABS
200.0000 mg | ORAL_TABLET | Freq: Two times a day (BID) | ORAL | Status: DC
  Administered 2024-08-31 – 2024-09-01 (×3): 200 mg via ORAL
  Filled 2024-08-31 (×3): qty 4

## 2024-08-31 MED ORDER — METOPROLOL SUCCINATE ER 50 MG PO TB24
50.0000 mg | ORAL_TABLET | Freq: Two times a day (BID) | ORAL | Status: DC
  Administered 2024-08-31 – 2024-09-01 (×2): 50 mg via ORAL
  Filled 2024-08-31 (×2): qty 1

## 2024-08-31 MED ORDER — ONDANSETRON HCL 4 MG/2ML IJ SOLN
4.0000 mg | Freq: Once | INTRAMUSCULAR | Status: AC
  Administered 2024-08-31: 4 mg via INTRAVENOUS
  Filled 2024-08-31: qty 2

## 2024-08-31 MED ORDER — ROPINIROLE HCL 1 MG PO TABS
4.0000 mg | ORAL_TABLET | Freq: Every day | ORAL | Status: DC
  Administered 2024-08-31: 4 mg via ORAL
  Filled 2024-08-31: qty 4

## 2024-08-31 MED ORDER — TORSEMIDE 20 MG PO TABS
40.0000 mg | ORAL_TABLET | Freq: Two times a day (BID) | ORAL | Status: DC
  Administered 2024-08-31 – 2024-09-01 (×2): 40 mg via ORAL
  Filled 2024-08-31 (×2): qty 2

## 2024-08-31 MED ORDER — METHYLPREDNISOLONE SODIUM SUCC 125 MG IJ SOLR
125.0000 mg | Freq: Once | INTRAMUSCULAR | Status: AC
  Administered 2024-08-31: 125 mg via INTRAVENOUS
  Filled 2024-08-31: qty 2

## 2024-08-31 MED ORDER — LORAZEPAM 0.5 MG PO TABS: 0.5000 mg | ORAL_TABLET | Freq: Four times a day (QID) | ORAL | Status: DC | PRN

## 2024-08-31 MED ORDER — LACTATED RINGERS IV BOLUS
1000.0000 mL | Freq: Once | INTRAVENOUS | Status: AC
  Administered 2024-08-31: 1000 mL via INTRAVENOUS

## 2024-08-31 MED ORDER — POLYETHYLENE GLYCOL 3350 17 G PO PACK: 17.0000 g | PACK | Freq: Every day | ORAL | Status: DC | PRN

## 2024-08-31 MED ORDER — METHYLPREDNISOLONE SODIUM SUCC 40 MG IJ SOLR
40.0000 mg | Freq: Two times a day (BID) | INTRAMUSCULAR | Status: AC
  Administered 2024-08-31 – 2024-09-01 (×2): 40 mg via INTRAVENOUS
  Filled 2024-08-31 (×2): qty 1

## 2024-08-31 MED ORDER — METHADONE HCL 5 MG PO TABS
20.0000 mg | ORAL_TABLET | Freq: Every day | ORAL | Status: DC
  Administered 2024-08-31: 20 mg via ORAL
  Filled 2024-08-31: qty 4

## 2024-08-31 MED ORDER — OXYCODONE HCL 5 MG PO TABS
5.0000 mg | ORAL_TABLET | ORAL | Status: DC | PRN
  Administered 2024-09-01: 5 mg via ORAL
  Filled 2024-08-31: qty 1

## 2024-08-31 MED ORDER — SODIUM CHLORIDE 0.9% FLUSH
3.0000 mL | Freq: Two times a day (BID) | INTRAVENOUS | Status: DC
  Administered 2024-08-31 (×2): 3 mL via INTRAVENOUS

## 2024-08-31 MED ORDER — POTASSIUM CHLORIDE CRYS ER 20 MEQ PO TBCR
40.0000 meq | EXTENDED_RELEASE_TABLET | Freq: Two times a day (BID) | ORAL | Status: AC
  Administered 2024-08-31 (×2): 40 meq via ORAL
  Filled 2024-08-31 (×2): qty 2

## 2024-08-31 MED ORDER — FUROSEMIDE 40 MG PO TABS: 80.0000 mg | ORAL_TABLET | Freq: Two times a day (BID) | ORAL | Status: DC

## 2024-08-31 MED ORDER — IPRATROPIUM-ALBUTEROL 0.5-2.5 (3) MG/3ML IN SOLN: 3.0000 mL | RESPIRATORY_TRACT | Status: DC

## 2024-08-31 MED ORDER — INFLUENZA VIRUS VACC SPLIT PF (FLUZONE) 0.5 ML IM SUSY
0.5000 mL | PREFILLED_SYRINGE | INTRAMUSCULAR | Status: AC
  Administered 2024-09-01: 0.5 mL via INTRAMUSCULAR
  Filled 2024-08-31: qty 0.5

## 2024-08-31 MED ORDER — ONDANSETRON HCL 4 MG/2ML IJ SOLN: 4.0000 mg | Freq: Four times a day (QID) | INTRAMUSCULAR | Status: DC | PRN

## 2024-08-31 MED ORDER — LIOTHYRONINE SODIUM 25 MCG PO TABS
25.0000 ug | ORAL_TABLET | Freq: Every morning | ORAL | Status: DC
  Administered 2024-08-31: 25 ug via ORAL
  Filled 2024-08-31: qty 1

## 2024-08-31 MED ORDER — ACETAMINOPHEN 650 MG RE SUPP: 650.0000 mg | Freq: Four times a day (QID) | RECTAL | Status: DC | PRN

## 2024-08-31 MED ORDER — INSULIN ASPART 100 UNIT/ML IJ SOLN
0.0000 [IU] | Freq: Three times a day (TID) | INTRAMUSCULAR | Status: DC
  Administered 2024-08-31: 5 [IU] via SUBCUTANEOUS
  Administered 2024-09-01: 3 [IU] via SUBCUTANEOUS
  Filled 2024-08-31: qty 2
  Filled 2024-08-31: qty 5

## 2024-08-31 MED ORDER — PROMETHAZINE HCL 25 MG PO TABS: 25.0000 mg | ORAL_TABLET | Freq: Three times a day (TID) | ORAL | Status: DC

## 2024-08-31 MED ORDER — INSULIN ASPART 100 UNIT/ML IJ SOLN: 0.0000 [IU] | Freq: Every day | INTRAMUSCULAR | Status: DC

## 2024-08-31 MED ORDER — PROMETHAZINE HCL 25 MG PO TABS
25.0000 mg | ORAL_TABLET | Freq: Four times a day (QID) | ORAL | Status: DC | PRN
  Administered 2024-08-31: 25 mg via ORAL
  Filled 2024-08-31 (×2): qty 1

## 2024-08-31 MED ORDER — METHADONE HCL 5 MG PO TABS: 10.0000 mg | ORAL_TABLET | Freq: Three times a day (TID) | ORAL | Status: DC

## 2024-08-31 MED ORDER — LOSARTAN POTASSIUM 25 MG PO TABS
12.5000 mg | ORAL_TABLET | Freq: Two times a day (BID) | ORAL | Status: DC
  Administered 2024-08-31 – 2024-09-01 (×3): 12.5 mg via ORAL
  Filled 2024-08-31 (×3): qty 1

## 2024-08-31 MED ORDER — ENOXAPARIN SODIUM 40 MG/0.4ML IJ SOSY
40.0000 mg | PREFILLED_SYRINGE | INTRAMUSCULAR | Status: DC
  Administered 2024-08-31: 40 mg via SUBCUTANEOUS
  Filled 2024-08-31: qty 0.4

## 2024-08-31 NOTE — Assessment & Plan Note (Signed)
 Echocardiogram done in April 2025 with EF of 45 to 50% and grade 3 diastolic dysfunction.  Likely ischemic cardiomyopathy.  proBNP normal and clinically appears euvolemic. - Continue home torsemide  and spironolactone 

## 2024-08-31 NOTE — Assessment & Plan Note (Signed)
 Patient is undergoing chemotherapy, last therapy was on Tuesday. - Continue with outpatient oncology follow-up and chemotherapy

## 2024-08-31 NOTE — Progress Notes (Signed)
 OT Cancellation Note  Patient Details Name: Brittany Nguyen MRN: [id number] DOB: 09/04/1966   Cancelled Treatment:    Reason Eval/Treat Not Completed: Medical issues which prohibited therapy. Pt working with PT and experiencing sudden onset HA, nausea. OT alerted charge RN while PT assessed vitals. OT will follow and see at later date/time as appropriate.   Nesa Distel L. Graceanna Theissen, OTR/L  08/31/2024, 3:40 PM

## 2024-08-31 NOTE — Plan of Care (Signed)
" °  Problem: Education: Goal: Knowledge of General Education information will improve Description: Including pain rating scale, medication(s)/side effects and non-pharmacologic comfort measures Outcome: Progressing   Problem: Health Behavior/Discharge Planning: Goal: Ability to manage health-related needs will improve Outcome: Progressing   Problem: Clinical Measurements: Goal: Ability to maintain clinical measurements within normal limits will improve Outcome: Progressing Goal: Will remain free from infection Outcome: Progressing Goal: Diagnostic test results will improve Outcome: Progressing Goal: Respiratory complications will improve Outcome: Progressing Goal: Cardiovascular complication will be avoided Outcome: Progressing   Problem: Education: Goal: Knowledge of disease or condition will improve Outcome: Progressing Goal: Knowledge of the prescribed therapeutic regimen will improve Outcome: Progressing Goal: Individualized Educational Video(s) Outcome: Progressing   Problem: Skin Integrity: Goal: Risk for impaired skin integrity will decrease Outcome: Progressing   Problem: Safety: Goal: Ability to remain free from injury will improve Outcome: Progressing   Problem: Activity: Goal: Ability to tolerate increased activity will improve Outcome: Progressing Goal: Will verbalize the importance of balancing activity with adequate rest periods Outcome: Progressing   Problem: Education: Goal: Knowledge of disease or condition will improve Outcome: Progressing Goal: Knowledge of the prescribed therapeutic regimen will improve Outcome: Progressing Goal: Individualized Educational Video(s) Outcome: Progressing   "

## 2024-08-31 NOTE — ED Provider Notes (Signed)
 "  [facility] Provider Note    Event Date/Time   First MD Initiated Contact with Patient 08/31/24 [phone]     (approximate)   History   Shortness of Breath and hypokalemia   HPI  Brittany Nguyen is a 58 y.o. female who presents to the ED for evaluation of Shortness of Breath and hypokalemia   Review of [facility] oncology clinic visit from 11/10.  Gastric neuroendocrine tumor, chronic pain syndrome, COPD, CHF, seizure disorder  Patient presents for evaluation of shortness of breath, cough and audible wheezing over the past 1-2 days.   Physical Exam   Triage Vital Signs: ED Triage Vitals  Encounter Vitals Group     BP 08/31/24 0720 109/81     Girls Systolic BP Percentile --      Girls Diastolic BP Percentile --      Boys Systolic BP Percentile --      Boys Diastolic BP Percentile --      Pulse Rate 08/31/24 0720 (!) 102     Resp 08/31/24 0720 (!) 24     Temp 08/31/24 0720 98 F (36.7 C)     Temp Source 08/31/24 0720 Oral     SpO2 08/31/24 0720 100 %     Weight 08/31/24 0722 199 lb 15.3 oz (90.7 kg)     Height 08/31/24 0722 5' 5 (1.651 m)     Head Circumference --      Peak Flow --      Pain Score 08/31/24 0721 8     Pain Loc --      Pain Education --      Exclude from Growth Chart --     Most recent vital signs: Vitals:   08/31/24 1030 08/31/24 1050  BP: 113/81   Pulse: 86 89  Resp: 18 15  Temp:    SpO2: 96% (!) 88%    General: Awake, no distress.  CV:  Good peripheral perfusion.  Resp:  Mild tachypnea, wheezing throughout and decreased airflow throughout Abd:  No distention.  MSK:  No deformity noted.  Neuro:  No focal deficits appreciated. Other:     ED Results / Procedures / Treatments   Labs (all labs ordered are listed, but only abnormal results are displayed) Labs Reviewed  COMPREHENSIVE METABOLIC PANEL WITH GFR - Abnormal; Notable for the following components:      Result Value   Potassium 3.1 (*)    Chloride 97 (*)     Glucose, Bld 136 (*)    Calcium  8.8 (*)    All other components within normal limits  CBC - Abnormal; Notable for the following components:   RBC 3.76 (*)    Hemoglobin 9.3 (*)    HCT 29.7 (*)    MCV 79.0 (*)    MCH 24.7 (*)    RDW 18.4 (*)    All other components within normal limits  TROPONIN T, HIGH SENSITIVITY - Abnormal; Notable for the following components:   Troponin T High Sensitivity 23 (*)    All other components within normal limits  TROPONIN T, HIGH SENSITIVITY - Abnormal; Notable for the following components:   Troponin T High Sensitivity 20 (*)    All other components within normal limits  RESP PANEL BY RT-PCR (RSV, FLU A&B, COVID)  RVPGX2  RESPIRATORY PANEL BY PCR  PRO BRAIN NATRIURETIC PEPTIDE  MAGNESIUM     EKG Sinus rhythm with a rate of 92 bpm leftward axis, no STEMI  RADIOLOGY CXR interpreted  by me without evidence of acute cardiopulmonary pathology.  Official radiology report(s): DG Chest Port 1 View Result Date: 08/31/2024 CLINICAL DATA:  Shortness of breath. EXAM: PORTABLE CHEST 1 VIEW COMPARISON:  08/02/2024 FINDINGS: The cardio pericardial silhouette is enlarged. The lungs are clear without focal pneumonia, edema, pneumothorax or pleural effusion. Left-sided permanent pacemaker/AICD again noted. Telemetry leads overlie the chest. IMPRESSION: Enlargement of the cardiopericardial silhouette. No acute cardiopulmonary findings. Electronically Signed   By: Camellia Candle M.D.   On: 08/31/2024 08:37    PROCEDURES and INTERVENTIONS:  .1-3 Lead EKG Interpretation  Performed by: Claudene Rover, MD Authorized by: Claudene Rover, MD     Interpretation: normal     ECG rate:  90   ECG rate assessment: normal     Rhythm: sinus rhythm     Ectopy: none     Conduction: normal   .Critical Care  Performed by: Claudene Rover, MD Authorized by: Claudene Rover, MD   Critical care provider statement:    Critical care time (minutes):  30   Critical care time was  exclusive of:  Separately billable procedures and treating other patients   Critical care was necessary to treat or prevent imminent or life-threatening deterioration of the following conditions:  Respiratory failure   Critical care was time spent personally by me on the following activities:  Development of treatment plan with patient or surrogate, discussions with consultants, evaluation of patient's response to treatment, examination of patient, ordering and review of laboratory studies, ordering and review of radiographic studies, ordering and performing treatments and interventions, pulse oximetry, re-evaluation of patient's condition and review of old charts   Medications  potassium chloride  SA (KLOR-CON  M) CR tablet 40 mEq (has no administration in time range)  ipratropium-albuterol  (DUONEB) 0.5-2.5 (3) MG/3ML nebulizer solution 6 mL (6 mLs Nebulization Given 08/31/24 0815)  methylPREDNISolone  sodium succinate (SOLU-MEDROL ) 125 mg/2 mL injection 125 mg (125 mg Intravenous Given 08/31/24 0814)  lactated ringers  bolus 1,000 mL (0 mLs Intravenous Stopped 08/31/24 0928)  ondansetron  (ZOFRAN ) injection 4 mg (4 mg Intravenous Given 08/31/24 0813)  morphine  (PF) 4 MG/ML injection 4 mg (4 mg Intravenous Given 08/31/24 0815)  ipratropium-albuterol  (DUONEB) 0.5-2.5 (3) MG/3ML nebulizer solution 6 mL (6 mLs Nebulization Given 08/31/24 0846)  famotidine  (PEPCID ) IVPB 20 mg premix (0 mg Intravenous Stopped 08/31/24 0917)  morphine  (PF) 4 MG/ML injection 4 mg (4 mg Intravenous Given 08/31/24 0846)     IMPRESSION / MDM / ASSESSMENT AND PLAN / ED COURSE  I reviewed the triage vital signs and the nursing notes.  Differential diagnosis includes, but is not limited to, ACS, PTX, PNA, muscle strain/spasm, PE, dissection, anxiety, pleural effusion  {Patient presents with symptoms of an acute illness or injury that is potentially life-threatening.  Patient presents with shortness of breath with evidence of  COP exacerbation and hypoxia requiring medical admission.  Saturations on room air as low as 78%, improving with nebulizers but patient remains on 2 L nasal cannula.  Sats in the mid/upper 80s after multiple rounds of DuoNebs.  CXR is clear, viral swabs are negative and troponins are low.  No infiltrates or pneumothorax, no indications for antibiotics at this point.  Due to continued symptoms, hypoxia, consult medicine for admission  Clinical Course as of 08/31/24 1124  Sat Aug 31, 2024  0738 2 days SOB, wheezing.  [DS]  9164 Reassessed.  Continued tachypnea, wheezing and coughing. [DS]  1100 Reassessed, similar clinical picture.  Discussed disposition and due to her continued wheezing,  tachypnea and symptoms I suspect medical observation admission would be reasonable.  She is agreeable. [DS]  1123 I consult with medicine who agrees to admit [DS]    Clinical Course User Index [DS] Claudene Rover, MD     FINAL CLINICAL IMPRESSION(S) / ED DIAGNOSES   Final diagnoses:  COPD exacerbation (HCC)  Shortness of breath  Hypoxia     Rx / DC Orders   ED Discharge Orders     None        Note:  This document was prepared using Dragon voice recognition software and may include unintentional dictation errors.   Claudene Rover, MD 08/31/24 1125  "

## 2024-08-31 NOTE — Assessment & Plan Note (Signed)
 Estimated body mass index is 33.27 kg/m as calculated from the following:   Height as of this encounter: 5' 5 (1.651 m).   Weight as of this encounter: 90.7 kg.   -Patient with class I obesity -Encouraged weight loss

## 2024-08-31 NOTE — ED Triage Notes (Signed)
 Pt to ED for SOB since 2 days. Audible expiratory wheezing heard across the room with prolonged expiration and labored breathing. Hx CHF, COPD. Also PCP told pt to come to ED because potassium level is 2.9. states sharp pain to L chest since 4 hr ago. Denies recent illness. Current stomach cancer, last chemo was 12/19.

## 2024-08-31 NOTE — Assessment & Plan Note (Signed)
-   Continuing home Percocet and methadone

## 2024-08-31 NOTE — Evaluation (Addendum)
 Physical Therapy Evaluation Patient Details Name: Brittany Nguyen MRN: [id number] DOB: 1965-10-09 Today's Date: 08/31/2024  History of Present Illness  Brittany Nguyen is a 58 y.o. female with medical history significant of reactive airway disease, neuroendocrine GI tumor, diet-controlled diabetes, chronic pain syndrome, history of CAD and chronic HFrEF presented to ED with complaint of worsening shortness of breath and wheezing for the past 2 to 3 days.  Clinical Impression  Patient noted to be in supine position at PT arrival in room, for an initial PT evaluation due to a decline in functional status, with baseline mobility reported as modI, and currently requiring modI initially but regressed to modA due to becoming symptomatic after standing at bedside prior to ambulation attempt. Pt agreeable to attempt ambulation. Pt only able to ambulate ~10 feet and symptoms seemingly remained persistant as pt. gradually returned to bed. Vitals assess and were WNL; orthostatic testing negative. Pt symptoms returned to baseline after ~ 3 min. RN and MD notified.The patient is A&O x 4, presenting with good willingness to work with PT. The patient resides in a mobile home and lives with spouse with family/friend support. There are 8 STE inside the residence. The overall clinical impression is that the patient presents with moderate mobility limitations. Recommended skilled PT will address safety, mobility, and discharge planning.     If plan is discharge home, recommend the following: A little help with bathing/dressing/bathroom;A little help with walking and/or transfers;Help with stairs or ramp for entrance;Assist for transportation   Can travel by private vehicle        Equipment Recommendations None recommended by PT  Recommendations for Other Services       Functional Status Assessment Patient has had a recent decline in their functional status and demonstrates the ability to make significant  improvements in function in a reasonable and predictable amount of time.     Precautions / Restrictions Restrictions Weight Bearing Restrictions Per Provider Order: No      Mobility  Bed Mobility Overal bed mobility: Needs Assistance Bed Mobility: Supine to Sit, Sit to Supine     Supine to sit: Supervision, Modified independent (Device/Increase time) Sit to supine: Mod assist, Min assist   General bed mobility comments: pt. after ambulation attempt symptomatic with nausea and dizziness required min/modA to return to bed    Transfers Overall transfer level: Needs assistance Equipment used: 1 person hand held assist, None Transfers: Sit to/from Stand Sit to Stand: Contact guard assist, Supervision                Ambulation/Gait Ambulation/Gait assistance: Mod assist Gait Distance (Feet): 10 Feet Assistive device: None Gait Pattern/deviations: Step-through pattern, Staggering left, Narrow base of support, Trunk flexed       General Gait Details: pt. only able to take few steps in room prior to becoming symptomatic with nausea and dizziness; vitals assessd and all WNL with slightly elevated HR in low 100s; orthostatic readings negative; after ~ . and application of cold compress to back of neck pt. symptoms improved; RN and MD notified  Stairs            Wheelchair Mobility     Tilt Bed    Modified Rankin (Stroke Patients Only)       Balance Overall balance assessment: Needs assistance Sitting-balance support: Feet unsupported Sitting balance-Leahy Scale: Normal     Standing balance support: During functional activity Standing balance-Leahy Scale: Poor Standing balance comment: pt required modA to totalA support to guide  back to bed                             Pertinent Vitals/Pain Pain Assessment Pain Assessment: No/denies pain    Home Living Family/patient expects to be discharged to:: Private residence Living Arrangements:  Spouse/significant other Available Help at Discharge: Family;Available 24 hours/day Type of Home: Mobile home Home Access: Stairs to enter Entrance Stairs-Rails: Can reach both;Right;Left Entrance Stairs-Number of Steps: 8   Home Layout: One level Home Equipment: Grab bars - tub/shower;Shower seat - built in;Cane - single Librarian, Academic (2 wheels);BSC/3in1      Prior Function Prior Level of Function : Independent/Modified Independent;Driving             Mobility Comments: Ind amb community distances without an AD, no fall history ADLs Comments: Ind with ADLs     Extremity/Trunk Assessment   Upper Extremity Assessment Upper Extremity Assessment: Generalized weakness    Lower Extremity Assessment Lower Extremity Assessment: Generalized weakness    Cervical / Trunk Assessment Cervical / Trunk Assessment: Kyphotic  Communication   Communication Communication: No apparent difficulties    Cognition Arousal: Alert Behavior During Therapy: WFL for tasks assessed/performed   PT - Cognitive impairments: No apparent impairments                         Following commands: Intact       Cueing Cueing Techniques: Verbal cues     General Comments      Exercises     Assessment/Plan    PT Assessment Patient needs continued PT services  PT Problem List Decreased strength;Decreased activity tolerance;Decreased balance;Decreased mobility       PT Treatment Interventions Gait training;Functional mobility training;Stair training;Therapeutic activities;Therapeutic exercise;Patient/family education;Neuromuscular re-education;Balance training    PT Goals (Current goals can be found in the Care Plan section)  Acute Rehab PT Goals Patient Stated Goal: Pt wants to get better PT Goal Formulation: With patient Time For Goal Achievement: 09/21/24 Potential to Achieve Goals: Good    Frequency Min 2X/week     Co-evaluation               AM-PAC PT 6  Clicks Mobility  Outcome Measure Help needed turning from your back to your side while in a flat bed without using bedrails?: None Help needed moving from lying on your back to sitting on the side of a flat bed without using bedrails?: A Little Help needed moving to and from a bed to a chair (including a wheelchair)?: A Little Help needed standing up from a chair using your arms (e.g., wheelchair or bedside chair)?: A Little Help needed to walk in hospital room?: A Lot Help needed climbing 3-5 steps with a railing? : Total 6 Click Score: 16    End of Session   Activity Tolerance: Other (comment) (limited by nausea and dizziness) Patient left: in bed;with call bell/phone within reach;with bed alarm set Nurse Communication: Mobility status PT Visit Diagnosis: Other abnormalities of gait and mobility (R26.89);Dizziness and giddiness (R42);Unsteadiness on feet (R26.81)    Time: 8484-8457 PT Time Calculation (min) (ACUTE ONLY): 27 min   Charges:   PT Evaluation $PT Eval Low Complexity: 1 Low   PT General Charges $$ ACUTE PT VISIT: 1 Visit         Sherlean Lesches DPT, PT    Rakia Frayne A Tyeson Tanimoto 08/31/2024, 4:04 PM

## 2024-08-31 NOTE — Assessment & Plan Note (Signed)
 No current chest pain, barely positive troponin with a flat curve, likely demand with upper respiratory infection. -Continuing home metoprolol  -Not on any antiplatelet or statin at home

## 2024-08-31 NOTE — Assessment & Plan Note (Signed)
 Blood sugar currently within goal. -Started on SSI as patient is taking steroid -Carb modified diet

## 2024-08-31 NOTE — Assessment & Plan Note (Signed)
 New oxygen  requirement of 2 L, no baseline oxygen  use.  Likely secondary to COPD exacerbation with some viral upper respiratory infection. -Continue supplemental oxygen -wean as tolerated -Patient need to be ambulated for discharge

## 2024-08-31 NOTE — H&P (Signed)
 " History and Physical    Patient: Brittany Nguyen FMW:[id number] DOB: 03/07/1966 DOA: 08/31/2024 DOS: the patient was seen and examined on 08/31/2024 PCP: Valora Lynwood FALCON, MD  Patient coming from: Home  Chief Complaint:  Chief Complaint  Patient presents with   Shortness of Breath   hypokalemia   HPI: Brittany Nguyen is a 58 y.o. female with medical history significant of reactive airway disease, neuroendocrine GI tumor, diet-controlled diabetes, chronic pain syndrome, history of CAD and chronic HFrEF presented to ED with complaint of worsening shortness of breath and wheezing for the past 2 to 3 days.  Patient is undergoing chemotherapy for her neuroendocrine GI tumor, last therapy was on Tuesday.  Patient had 3 grandkids sick with upper respiratory symptoms.  Denies any fever or chills.  Appetite is poor.  Having nausea, vomiting and diarrhea but it seems chronic with no recent change.  Intermittent abdominal pain which is also chronic with no recent change.  No urinary symptoms.  ED course and data reviewed.  On presentation mild tachycardia and tachypnea, mild hypoxia requiring 2 L of oxygen .  Respiratory panel negative.  Labs with hypokalemia at 3.1.  Rest of the labs around baseline. Chest x-ray with some cardiomegaly, no other acute abnormality.  Patient received multiple doses of DuoNeb and Solu-Medrol .  Continued to feel short of breath and requiring 2 L of oxygen  so admission was requested.  Patient is being admitted for concern of COPD exacerbation, RVP was also ordered to see the underlying cause of current exacerbation.  Review of Systems: As mentioned in the history of present illness. All other systems reviewed and are negative. Past Medical History:  Diagnosis Date   Acute anterolateral wall MI (HCC) 08/2016   Acute respiratory failure (HCC)    AICD (automatic cardioverter/defibrillator) present 03/29/2017   a.) Medtronic Evera MRI XT DR SureScan (SN:  [id number])   Anxiety    a.) on BZO PRN (lorazepam )   Arthritis    Asthma 2013   Breast cancer, right (HCC)    C. 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   a.) Bx 02/22/2018 --> NE tumor cells --> AE1/AE3, chromogranin, synaptophysin (+); Ki-67 proliferation rate <1%; b.) repeat Bx 08/24/2018 --> well differentiard NET (G1)   CHF (congestive heart failure) (HCC)    Chronic, continuous use of opioids    a.) has naloxone  Rx available   COPD (chronic obstructive pulmonary disease) (HCC)    Coronary artery disease    Depression    Diet-controlled type 2 diabetes mellitus (HCC)    Diverticulitis 2010   DVT (deep venous thrombosis) (HCC)    Endometriosis 1990   Generalized epilepsy (HCC)    a.) on lacosamide ; b.) seizure 07-30-22 in setting of hypokalemia (K+ 2.6); c.) last seizure 11/13/2023 during blood draw   GERD (gastroesophageal reflux disease)    GIB (gastrointestinal bleeding) 01/08/2018   H/O syncope    HFrEF (heart failure with reduced ejection fraction) (HCC)    a.)TTE 12/09/16: EF 25-30%, dif HK; b.) TTE 02/08/17: EF 25%, dif HK, mild TR, G1DD; c.) TTE 04/25/17: EF 30-35%, mild LVH; d.) TTE 01/30/18: EF 45%, MAC, LAE, mild MR, G1DD; e.) TTE 11/06/2018: EF 25%, sev glob HK, triv MR/TR/PR; f.) TTE 05/12/19: EF 35%, MAC, AoV sclerosis, G1DD; g.) TTE 11/05/19: EF 20%, sev glob HK, triv MR; h.) TTE 06/17/20: EF 40%, mod glob HK, triv MR; I:) TTE 06/14/22: EF 25-30%, G1DD   History of  cardiac catheterization    a.) LHC 10/21/2014: normal coronaries; b.) R/LHC 02/24/2017: normal coronaries, mRA 12, mPA 28, mPCWP 15, CO 8.0, CI 3.8   History of kidney stones    Hx MRSA infection    Hypertension    Hypokalemia    Hypothyroidism    a.) s/p thyroidectomy   Incomplete left bundle branch block (LBBB)    Iron  deficiency anemia 03/27/2018   Lump or mass in breast 11/27/2012   a.) Bx 11/27/2012 --> apocrine wall cyst   Migraine    Nausea &  vomiting    Neuropathy    Non-ischemic cardiomyopathy (HCC)    a.) TTE 12/09/2016: EF 25-30%, b.) TTE 02/08/2017: EF 25%; c.) Medtronic AICD placed 03/29/2017; d.) TTE 04/25/2017: EF 30-35%; e.) TTE 01/30/2018: EF 45%; f.) TTE 11/06/2018: EF 25%; g.) TTE 05/12/2019: EF 35%; h.) TTE 11/05/2019: EF 20%; I.) TTE 06/17/2020: EF 40%; J.) TTE 06/14/2022: EF 25-30%   Obesity    OSA on CPAP    PAF (paroxysmal atrial fibrillation) (HCC)    Pneumonia    PONV (postoperative nausea and vomiting)    Post-COVID chronic cough    Postcholecystectomy diarrhea    Prolonged Q-T interval on ECG    RAD (reactive airway disease)    Restless leg    a.) on ropinirole    Sepsis (HCC)    Unstable angina (HCC)    Past Surgical History:  Procedure Laterality Date   ABDOMINAL HYSTERECTOMY     age 58   BREAST BIOPSY Right 2014   benign   BREAST BIOPSY Right 01/18/2023   Us  Core Bx, coil clip - path pending   BREAST BIOPSY Right 01/18/2023   US  RT BREAST BX W LOC DEV 1ST LESION IMG BX SPEC US  GUIDE 01/18/2023 [facility]   CARDIAC DEFIBRILLATOR PLACEMENT Left 03/29/2017   Procedure: MEDTRONIC CARDIAC DEFIBRILLATOR PLACEMENT (AICD); Location: [facility]; Surgeon: Lane Beard, MD   CESAREAN SECTION     CHOLECYSTECTOMY     COLECTOMY     COLONOSCOPY WITH PROPOFOL  N/A 02/22/2018   Procedure: COLONOSCOPY WITH PROPOFOL ;  Surgeon: Toledo, Ladell POUR, MD;  Location: [facility];  Service: Gastroenterology;  Laterality: N/A;   ESOPHAGOGASTRODUODENOSCOPY (EGD) WITH PROPOFOL  N/A 02/22/2018   Procedure: ESOPHAGOGASTRODUODENOSCOPY (EGD) WITH PROPOFOL ;  Surgeon: Toledo, Ladell POUR, MD;  Location: [facility];  Service: Gastroenterology;  Laterality: N/A;   IVC FILTER INSERTION N/A 08/23/2022   Procedure: IVC FILTER INSERTION;  Surgeon: Jama Cordella MATSU, MD;  Location: [facility];  Service: Cardiovascular;  Laterality: N/A;   IVC FILTER REMOVAL N/A 03/07/2023   Procedure: IVC FILTER REMOVAL;  Surgeon:  Jama Cordella MATSU, MD;  Location: [facility];  Service: Cardiovascular;  Laterality: N/A;   KNEE ARTHROPLASTY Left 08/24/2022   Procedure: COMPUTER ASSISTED TOTAL KNEE ARTHROPLASTY;  Surgeon: Mardee Lynwood SQUIBB, MD;  Location: [facility];  Service: Orthopedics;  Laterality: Left;   KNEE ARTHROPLASTY Right 12/01/2023   Procedure: COMPUTER ASSISTED TOTAL KNEE ARTHROPLASTY;  Surgeon: Mardee Lynwood SQUIBB, MD;  Location: [facility];  Service: Orthopedics;  Laterality: Right;   LEFT HEART CATH AND CORONARY ANGIOGRAPHY Left 10/21/2014   Procedure: LEFT HEART CATH AND CORONARY ANGIOGRAPHY; Location: [facility]; Surgeon: Wolm Rhyme, MD   NASAL SINUS SURGERY  2012   OOPHORECTOMY     RIGHT/LEFT HEART CATH AND CORONARY ANGIOGRAPHY Bilateral 02/24/2017   Procedure: RIGHT/LEFT HEART CATH AND CORONARY ANGIOGRAPHY; Location: [facility]   THYROIDECTOMY     Social History:  reports that she has never smoked. She  has never used smokeless tobacco. She reports that she does not drink alcohol and does not use drugs.  Allergies[1]  Family History  Problem Relation Age of Onset   Breast cancer Mother    Cancer Mother 16       ovarian   CAD Mother    Cancer Father 26       brain   CAD Father    Cancer Daughter 8       skin   Breast cancer Maternal Aunt    Cancer Maternal Aunt 65       breast   Leukemia Paternal Grandfather    Breast cancer Cousin     Prior to Admission medications  Medication Sig Start Date End Date Taking? Authorizing Provider  albuterol  (PROVENTIL ) (2.5 MG/3ML) 0.083% nebulizer solution Inhale 2.5 mg into the lungs every 6 (six) hours as needed. 07/02/24 07/02/25 Yes [provider]  albuterol  (VENTOLIN  HFA) 108 (90 Base) MCG/ACT inhaler Inhale 2 puffs into the lungs every 6 (six) hours as needed for wheezing or shortness of breath. 02/09/24  Yes [provider]  cyanocobalamin  (VITAMIN B12) 1000 MCG/ML injection Inject 1,000 mcg into the muscle every 30 (thirty) days.   Yes  [provider]  escitalopram  (LEXAPRO ) 10 MG tablet Take 10 mg by mouth every morning. 03/07/24  Yes [provider]  gabapentin  (NEURONTIN ) 300 MG capsule Take 1 capsule (300 mg total) by mouth 3 (three) times daily. 10/08/23  Yes Ezenduka, Nkeiruka J, MD  Lacosamide  100 MG TABS Take 200 mg by mouth 2 (two) times daily.   Yes [provider]  levothyroxine  (SYNTHROID ) 200 MCG tablet Take 200 mcg by mouth daily before breakfast.   Yes [provider]  levothyroxine  (SYNTHROID ) 25 MCG tablet Take 25 mcg by mouth every morning. 07/05/24  Yes [provider]  LORazepam  (ATIVAN ) 1 MG tablet Take 1 tablet (1 mg total) by mouth 4 (four) times daily as needed. Patient taking differently: Take 1 mg by mouth 4 (four) times daily. 11/10/21  Yes Caleen Qualia, MD  losartan  (COZAAR ) 25 MG tablet Take 12.5 mg by mouth 2 (two) times daily. 12/08/23  Yes [provider]  methadone  (DOLOPHINE ) 10 MG tablet Take 10-20 mg by mouth 3 (three) times daily. 10 MG BID and 20 mg HS   Yes [provider]  metoprolol  succinate (TOPROL -XL) 50 MG 24 hr tablet Take 50 mg by mouth 2 (two) times daily. 12/08/23  Yes [provider]  Potassium Chloride  ER 20 MEQ TBCR Take 20 mEq by mouth 2 (two) times daily. When taking torsemide  12/08/23  Yes [provider]  promethazine  (PHENERGAN ) 25 MG tablet Take 25 mg by mouth 3 (three) times daily.   Yes [provider]  rOPINIRole  (REQUIP ) 4 MG tablet Take 4 mg by mouth at bedtime. 01/30/19  Yes [provider]  spironolactone  (ALDACTONE ) 50 MG tablet Take 50 mg by mouth 2 (two) times daily.   Yes [provider]  torsemide  (DEMADEX ) 20 MG tablet Take 40 mg by mouth 2 (two) times daily.   Yes [provider]  traZODone  (DESYREL ) 50 MG tablet Take 50 mg by mouth at bedtime. 03/01/24  Yes [provider]  Vitamin D , Ergocalciferol , (DRISDOL) 1.25 MG (50000 UNIT) CAPS capsule Take  50,000 Units by mouth once a week. 02/09/24  Yes [provider]  benzonatate  (TESSALON ) 200 MG capsule Take 1 capsule (200 mg total) by mouth 3 (three) times daily. Patient not taking:  Reported on 08/31/2024 03/30/24   Jhonny Calvin NOVAK, MD  doxycycline  (VIBRAMYCIN ) 100 MG capsule Take 100 mg by mouth 2 (two) times daily. Patient not taking: Reported on 08/31/2024 02/09/24   [provider]  furosemide  (LASIX ) 40 MG tablet Take 80 mg by mouth 2 (two) times daily. Patient not taking: Reported on 08/31/2024 02/21/24   [provider]  guaiFENesin  (MUCINEX ) 600 MG 12 hr tablet Take 1 tablet (600 mg total) by mouth 2 (two) times daily. Patient not taking: Reported on 08/31/2024 03/30/24   Jhonny Calvin NOVAK, MD  liothyronine  (CYTOMEL ) 25 MCG tablet Take 25 mcg by mouth every morning. Patient not taking: Reported on 08/31/2024    [provider]  morphine  (MSIR) 15 MG tablet Take 1 tablet (15 mg total) by mouth every 6 (six) hours as needed for severe pain (pain score 7-10). Patient not taking: Reported on 08/31/2024 03/30/24   Jhonny Calvin NOVAK, MD  sulfamethoxazole -trimethoprim  (BACTRIM  DS) 800-160 MG tablet Take 1 tablet by mouth 2 (two) times daily. Patient not taking: Reported on 08/31/2024 02/15/24   [provider]  tiZANidine  (ZANAFLEX ) 4 MG capsule Take 4 mg by mouth at bedtime. Patient not taking: Reported on 08/31/2024    [provider]    Physical Exam: Vitals:   08/31/24 0941 08/31/24 1000 08/31/24 1030 08/31/24 1050  BP:  113/76 113/81   Pulse: 88 90 86 89  Resp: 16 18 18 15   Temp:      TempSrc:      SpO2: (!) 89% 94% 96% (!) 88%  Weight:      Height:        General: Vital signs reviewed.  Patient is obese, in no acute distress and cooperative with exam.  Head: Normocephalic and atraumatic. Eyes: EOMI, conjunctivae normal, no scleral icterus.  Neck: Supple, trachea midline, normal ROM,  Cardiovascular: RRR, S1 normal, S2  normal, no murmurs, gallops, or rubs. Pulmonary/Chest: Clear to auscultation bilaterally, no wheezes, rales, or rhonchi. Abdominal: Soft, non-tender, non-distended, BS +, Extremities: No lower extremity edema bilaterally,  pulses symmetric and intact bilaterally. No cyanosis or clubbing. Neurological: A&O x3, Strength is normal and symmetric bilaterally, cranial nerve II-XII are grossly intact, no focal motor deficit, sensory intact to light touch bilaterally.  Skin: Warm, dry and intact.  Psychiatric: Normal mood and affect.   Data Reviewed: Prior data reviewed as mentioned above  Assessment and Plan: * COPD exacerbation (HCC) Positive sick contacts.  Respiratory panel negative.  RVP ordered and pending. -Admit to telemetry -Started on doxycycline  -Solu-Medrol  followed by prednisone  -Bronchodilators -Continue with supportive care  Acute hypoxic respiratory failure (HCC) New oxygen  requirement of 2 L, no baseline oxygen  use.  Likely secondary to COPD exacerbation with some viral upper respiratory infection. -Continue supplemental oxygen -wean as tolerated -Patient need to be ambulated for discharge  Chronic HFrEF (heart failure with reduced ejection fraction) (HCC) Echocardiogram done in April 2025 with EF of 45 to 50% and grade 3 diastolic dysfunction.  Likely ischemic cardiomyopathy.  proBNP normal and clinically appears euvolemic. - Continue home torsemide  and spironolactone    History of coronary artery disease No current chest pain, barely positive troponin with a flat curve, likely demand with upper respiratory infection. -Continuing home metoprolol  -Not on any antiplatelet or statin at home  Hypothyroidism - Continue home Synthroid   Hypokalemia Mild hypokalemia with potassium at 3.1, magnesium  normal.  Patient takes high doses of torsemide  at home. 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 - Continue with outpatient oncology follow-up and chemotherapy  Diet-controlled type 2 diabetes mellitus (HCC) Blood sugar currently within goal. -Started on SSI as patient is taking steroid -Carb modified diet  Chronic, continuous use of opioids - Continuing home Percocet and methadone   Obesity Estimated body mass index is 33.27 kg/m as calculated from the following:   Height as of this encounter: 5' 5 (1.651 m).   Weight as of this encounter: 90.7 kg.   -Patient with class I obesity -Encouraged weight loss    Advance Care Planning:   Code Status: Full Code   Consults: None  Family Communication: Discussed with patient  Severity of Illness: The appropriate patient status for this patient is OBSERVATION. Observation status is judged to be reasonable and necessary in order to provide the required intensity of service to ensure the patient's safety. The patient's presenting symptoms, physical exam findings, and initial radiographic and laboratory data in the context of their medical condition is felt to place them at decreased risk for further clinical deterioration. Furthermore, it is anticipated that the patient will be medically stable for discharge from the hospital within 2 midnights of admission.   This record has been created using Conservation officer, historic buildings. Errors have been sought and corrected,but may not always be located. Such creation errors do not reflect on the standard of care.   Author: Amaryllis Dare, MD 08/31/2024 2:31 PM  For on call review [email or web address].     [1]  Allergies Allergen Reactions   Contrast Media [Iodinated Contrast Media] Shortness Of Breath    Per CT report in 2014 pt had break through contrast reaction with hives and SOB following 13 hour prep. MSY    Ferumoxytol  Nausea Only   Iron  Sucrose Anaphylaxis   Lidocaine  Hives   Metrizamide Shortness Of Breath   Penicillins Hives and Other (See Comments)     Tolerated ceftriaxone  in past IgE = 4 (WNL) on 08/12/2022  ediate rash, facial/tongue/throat swelling, SOB or lightheadedness with hypotension, tachy   Sacubitril-Valsartan     Sz (unclear if new start entresto vs hypoglycemia)   Isosorbide Nitrate Other (See Comments)    Headache   Latex Hives    IgE < 0.10 (WNL) on 08/21/2023   Tizanidine  Other (See Comments)    Feels altered   Povidone-Iodine  Rash    Blistering rash   Pulmicort  [Budesonide ] Itching   "

## 2024-08-31 NOTE — Assessment & Plan Note (Signed)
 Continue home Synthroid

## 2024-08-31 NOTE — Assessment & Plan Note (Signed)
 Mild hypokalemia with potassium at 3.1, magnesium  normal.  Patient takes high doses of torsemide  at home. - Replace potassium and monitor

## 2024-08-31 NOTE — Assessment & Plan Note (Signed)
 Positive sick contacts.  Respiratory panel negative.  RVP ordered and pending. -Admit to telemetry -Started on doxycycline  -Solu-Medrol  followed by prednisone  -Bronchodilators -Continue with supportive care

## 2024-08-31 NOTE — Progress Notes (Signed)
 Respiratory protocol assessment completed  pt is on RA  xray shows lungs are clear with no edema or pleural effusion BS clear and diminished

## 2024-09-01 ENCOUNTER — Encounter: Payer: Self-pay | Admitting: Oncology

## 2024-09-01 ENCOUNTER — Other Ambulatory Visit: Payer: Self-pay

## 2024-09-01 DIAGNOSIS — I5022 Chronic systolic (congestive) heart failure: Secondary | ICD-10-CM | POA: Diagnosis not present

## 2024-09-01 DIAGNOSIS — R0902 Hypoxemia: Secondary | ICD-10-CM | POA: Diagnosis not present

## 2024-09-01 DIAGNOSIS — J441 Chronic obstructive pulmonary disease with (acute) exacerbation: Secondary | ICD-10-CM | POA: Diagnosis not present

## 2024-09-01 DIAGNOSIS — Z8679 Personal history of other diseases of the circulatory system: Secondary | ICD-10-CM | POA: Diagnosis not present

## 2024-09-01 LAB — CBC
HCT: 32.4 % — ABNORMAL LOW (ref 36.0–46.0)
Hemoglobin: 9.8 g/dL — ABNORMAL LOW (ref 12.0–15.0)
MCH: 24.3 pg — ABNORMAL LOW (ref 26.0–34.0)
MCHC: 30.2 g/dL (ref 30.0–36.0)
MCV: 80.4 fL (ref 80.0–100.0)
Platelets: 310 K/uL (ref 150–400)
RBC: 4.03 MIL/uL (ref 3.87–5.11)
RDW: 18.6 % — ABNORMAL HIGH (ref 11.5–15.5)
WBC: 16.7 K/uL — ABNORMAL HIGH (ref 4.0–10.5)
nRBC: 0 % (ref 0.0–0.2)

## 2024-09-01 LAB — GLUCOSE, CAPILLARY: Glucose-Capillary: 173 mg/dL — ABNORMAL HIGH (ref 70–99)

## 2024-09-01 LAB — BASIC METABOLIC PANEL WITH GFR
Anion gap: 11 (ref 5–15)
BUN: 12 mg/dL (ref 6–20)
CO2: 33 mmol/L — ABNORMAL HIGH (ref 22–32)
Calcium: 8.4 mg/dL — ABNORMAL LOW (ref 8.9–10.3)
Chloride: 99 mmol/L (ref 98–111)
Creatinine, Ser: 1.01 mg/dL — ABNORMAL HIGH (ref 0.44–1.00)
GFR, Estimated: >60 mL/min
Glucose, Bld: 203 mg/dL — ABNORMAL HIGH (ref 70–99)
Potassium: 4.2 mmol/L (ref 3.5–5.1)
Sodium: 143 mmol/L (ref 135–145)

## 2024-09-01 LAB — HEMOGLOBIN A1C
Hgb A1c MFr Bld: 7 % — ABNORMAL HIGH (ref 4.8–5.6)
Mean Plasma Glucose: 154.2 mg/dL

## 2024-09-01 MED ORDER — BENZONATATE 200 MG PO CAPS
200.0000 mg | ORAL_CAPSULE | Freq: Three times a day (TID) | ORAL | 0 refills | Status: AC
  Filled 2024-09-01: qty 20, 7d supply, fill #0

## 2024-09-01 MED ORDER — GLUCERNA SHAKE PO LIQD
237.0000 mL | Freq: Three times a day (TID) | ORAL | 0 refills | Status: AC
  Filled 2024-09-01: qty 10000, 14d supply, fill #0

## 2024-09-01 MED ORDER — ADULT MULTIVITAMIN W/MINERALS CH
1.0000 | ORAL_TABLET | Freq: Every day | ORAL | 0 refills | Status: AC
  Filled 2024-09-01: qty 90, 90d supply, fill #0

## 2024-09-01 MED ORDER — ONDANSETRON HCL 4 MG/2ML IJ SOLN
4.0000 mg | Freq: Four times a day (QID) | INTRAMUSCULAR | 0 refills | Status: DC | PRN
  Filled 2024-09-01: qty 2, 1d supply, fill #0

## 2024-09-01 MED ORDER — DOXYCYCLINE HYCLATE 100 MG PO TABS
100.0000 mg | ORAL_TABLET | Freq: Two times a day (BID) | ORAL | 0 refills | Status: AC
  Filled 2024-09-01: qty 10, 5d supply, fill #0

## 2024-09-01 MED ORDER — GLUCERNA SHAKE PO LIQD
237.0000 mL | Freq: Three times a day (TID) | ORAL | Status: DC
  Administered 2024-09-01: 237 mL via ORAL

## 2024-09-01 MED ORDER — ADULT MULTIVITAMIN W/MINERALS CH
1.0000 | ORAL_TABLET | Freq: Every day | ORAL | Status: DC
  Administered 2024-09-01: 1 via ORAL
  Filled 2024-09-01: qty 1

## 2024-09-01 MED ORDER — GUAIFENESIN-DM 100-10 MG/5ML PO SYRP
5.0000 mL | ORAL_SOLUTION | ORAL | Status: DC | PRN
  Administered 2024-09-01: 5 mL via ORAL
  Filled 2024-09-01: qty 10

## 2024-09-01 MED ORDER — GUAIFENESIN ER 600 MG PO TB12
600.0000 mg | ORAL_TABLET | Freq: Two times a day (BID) | ORAL | 0 refills | Status: AC
  Filled 2024-09-01: qty 20, 10d supply, fill #0

## 2024-09-01 MED ORDER — PREDNISONE 20 MG PO TABS
40.0000 mg | ORAL_TABLET | Freq: Every day | ORAL | 0 refills | Status: AC
  Filled 2024-09-01: qty 8, 4d supply, fill #0

## 2024-09-01 NOTE — Inpatient Diabetes Management (Signed)
 Inpatient Diabetes Program Recommendations  AACE/ADA: New Consensus Statement on Inpatient Glycemic Control (2015)  Target Ranges:  Prepandial:   less than 140 mg/dL      Peak postprandial:   less than 180 mg/dL (1-2 hours)      Critically ill patients:  140 - 180 mg/dL   Lab Results  Component Value Date   GLUCAP 173 (H) 09/01/2024   HGBA1C 6.4 (H) 11/22/2023    Review of Glycemic Control  Latest Reference Range & Units 08/31/24 16:06 08/31/24 22:02 09/01/24 08:02  Glucose-Capillary 70 - 99 mg/dL 779 (H) 808 (H) 826 (H)   Diabetes history: DM type 2 Outpatient Diabetes medications: Diet controlled Current orders for Inpatient glycemic control:  Novolog  0-15 units tid + hs Steroids transitioned from Solumedrol to PO Prednisone  40 mg Daily Glucerna ordered tid between meals  -  12/19 last Lanreotide injection for neuroendocrine tumor on 12/19 (can lead to glucose instability causing both hyper and hypoglycemia) -   A1c obtained 12/17 was 7.3% -   On 12/19 TSH was 46.43/T4 <0.25/T3 5.19 (also leading to glucose instability until stabilized)  Inpatient Diabetes Program Recommendations:    Watch trends for now with transition of steroids  Glucose trends were upper 100 range on Solumedrol should improve.   Thanks,  Clotilda Bull RN, MSN, BC-ADM Inpatient Diabetes Coordinator Team Pager [phone] (8a-5p)

## 2024-09-01 NOTE — Progress Notes (Signed)
 PT Cancellation Note  Patient Details Name: Brittany Nguyen MRN: [id number] DOB: 1966-05-09   Cancelled Treatment:    Reason Eval/Treat Not Completed: Other (comment)  Pt in bed stating she is awaiting discharge.  Reports being up with no further c/o dizziness and feels comfortable with mobility.  Declined interventions and stated she had no other needs.   Lauraine Gills 09/01/2024, 10:40 AM

## 2024-09-01 NOTE — Discharge Summary (Signed)
 " Physician Discharge Summary   Patient: Brittany Nguyen MRN: [id number] DOB: [birth year]  Admit date:     08/31/2024  Discharge date: 09/01/2024  Discharge Physician: Amaryllis Dare   PCP: Valora Lynwood FALCON, MD   Recommendations at discharge:  Please obtain CBC and BMP on follow-up Follow-up with primary care provider Follow-up with oncology  Discharge Diagnoses: Principal Problem:   COPD exacerbation [facility]) Active Problems:   Acute hypoxic respiratory failure (HCC)   Chronic HFrEF (heart failure with reduced ejection fraction) (HCC)   History of coronary artery disease   Hypothyroidism   Hypokalemia   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 female with medical history significant of reactive airway disease, neuroendocrine GI tumor, diet-controlled diabetes, chronic pain syndrome, history of CAD and chronic HFrEF presented to ED with complaint of worsening shortness of breath and wheezing for the past 2 to 3 days.   Patient is undergoing chemotherapy for her neuroendocrine GI tumor, last therapy was on Tuesday.  Patient had 3 grandkids sick with upper respiratory symptoms.    On presentation mild tachycardia and tachypnea, mild hypoxia requiring 2 L of oxygen .  Respiratory panel negative.  Labs with hypokalemia at 3.1.  Rest of the labs around baseline. Chest x-ray with some cardiomegaly, no other acute abnormality.   Patient received multiple doses of DuoNeb and Solu-Medrol .  Continued to feel short of breath and requiring 2 L of oxygen  so admission was requested.   Patient is being admitted for concern of COPD exacerbation.  12/28: Patient was able to wean back to room air and remained hemodynamically stable.  Labs stable.  Respiratory viral panel also came back negative.  Patient still feeling weak likely  some upper respiratory infection with positive sick contacts.  She is being discharged on doxycycline  and prednisone  to complete the course.  Patient will continue the rest of her home medications and need to have a close follow-up with her providers for further assistance.  Assessment and Plan: * COPD exacerbation (HCC) Positive sick contacts.  Respiratory panel negative. No more wheezing.  Patient is being discharged on prednisone  and doxycycline  and will follow-up with primary care provider  Acute hypoxic respiratory failure (HCC) New oxygen  requirement of 2 L, no baseline oxygen  use.  Likely secondary to COPD exacerbation with some viral upper respiratory infection. Patient was able to wean back to room air and no desaturation with ambulation.  Chronic HFrEF (heart failure with reduced ejection fraction) (HCC) Echocardiogram done in April 2025 with EF of 45 to 50% and grade 3 diastolic dysfunction.  Likely ischemic cardiomyopathy.  proBNP normal and clinically appears euvolemic. - Continue home torsemide  and spironolactone    History of coronary artery disease No current chest pain, barely positive troponin with a flat curve, likely demand with upper respiratory infection. -Continuing home metoprolol  -Not on any antiplatelet or statin at home  Hypothyroidism - Continue home Synthroid   Hypokalemia Resolved with repletion.  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 - Continue with outpatient oncology follow-up and chemotherapy  Diet-controlled type 2 diabetes mellitus (HCC) Blood sugar currently within goal. -Started on SSI as patient is taking steroid -Carb modified diet  Chronic, continuous use of opioids - Continuing home Percocet and methadone   Obesity Estimated body mass  index is 33.27 kg/m as calculated from the following:   Height as of this encounter: 5' 5 (1.651 m).   Weight as of this  encounter: 90.7 kg.   -Patient with class I obesity -Encouraged weight loss     Pain control - [place]  Controlled Substance Reporting System database was reviewed. and patient was instructed, not to drive, operate heavy machinery, perform activities at heights, swimming or participation in water  activities or provide baby-sitting services while on Pain, Sleep and Anxiety Medications; until their outpatient Physician has advised to do so again. Also recommended to not to take more than prescribed Pain, Sleep and Anxiety Medications.  Consultants: None Procedures performed: None Disposition: Home Diet recommendation:  Cardiac and Carb modified diet DISCHARGE MEDICATION: Allergies as of 09/01/2024       Reactions   Contrast Media [iodinated Contrast Media] Shortness Of Breath   Per CT report in 2014 pt had break through contrast reaction with hives and SOB following 13 hour prep. MSY    Ferumoxytol  Nausea Only   Iron  Sucrose Anaphylaxis   Lidocaine  Hives   Metrizamide Shortness Of Breath   Penicillins Hives, Other (See Comments)   Tolerated ceftriaxone  in past IgE = 4 (WNL) on 08/12/2022 ediate rash, facial/tongue/throat swelling, SOB or lightheadedness with hypotension, tachy   Sacubitril-valsartan    Sz (unclear if new start entresto vs hypoglycemia)   Isosorbide Nitrate Other (See Comments)   Headache   Latex Hives   IgE < 0.10 (WNL) on 08/21/2023   Tizanidine  Other (See Comments)   Feels altered   Zofran  [ondansetron ] Other (See Comments)   Migraine per pt   Povidone-iodine  Rash   Blistering rash   Pulmicort  [budesonide ] Itching        Medication List     STOP taking these medications    doxycycline  100 MG capsule Commonly known as: VIBRAMYCIN  Replaced by: doxycycline  100 MG tablet   furosemide  40 MG tablet Commonly known as: LASIX    liothyronine  25 MCG tablet Commonly known as: CYTOMEL    morphine  15 MG tablet Commonly known as: MSIR    sulfamethoxazole -trimethoprim  800-160 MG tablet Commonly known as: BACTRIM  DS   tiZANidine  4 MG capsule Commonly known as: ZANAFLEX        TAKE these medications    rOPINIRole  4 MG tablet Commonly known as: REQUIP  Take 4 mg by mouth at bedtime. The timing of this medication is very important.   albuterol  108 (90 Base) MCG/ACT inhaler Commonly known as: VENTOLIN  HFA Inhale 2 puffs into the lungs every 6 (six) hours as needed for wheezing or shortness of breath.   albuterol  (2.5 MG/3ML) 0.083% nebulizer solution Commonly known as: PROVENTIL  Inhale 2.5 mg into the lungs every 6 (six) hours as needed.   benzonatate  200 MG capsule Commonly known as: TESSALON  Take 1 capsule (200 mg total) by mouth 3 (three) times daily.   cyanocobalamin  1000 MCG/ML injection Commonly known as: VITAMIN B12 Inject 1,000 mcg into the muscle every 30 (thirty) days.   doxycycline  100 MG tablet Commonly known as: VIBRA -TABS Take 1 tablet (100 mg total) by mouth every 12 (twelve) hours for 5 days. Replaces: doxycycline  100 MG capsule   escitalopram  10 MG tablet Commonly known as: LEXAPRO  Take 10 mg by mouth every morning.   feeding supplement (GLUCERNA SHAKE) Liqd Take 237 mLs by mouth 3 (three) times daily between meals.   gabapentin  300 MG capsule Commonly known as: NEURONTIN  Take 1 capsule (300 mg total) by mouth 3 (three) times daily.  guaiFENesin  600 MG 12 hr tablet Commonly known as: MUCINEX  Take 1 tablet (600 mg total) by mouth 2 (two) times daily.   Lacosamide  100 MG Tabs Take 200 mg by mouth 2 (two) times daily.   levothyroxine  200 MCG tablet Commonly known as: SYNTHROID  Take 200 mcg by mouth daily before breakfast.   levothyroxine  25 MCG tablet Commonly known as: SYNTHROID  Take 25 mcg by mouth every morning.   LORazepam  1 MG tablet Commonly known as: ATIVAN  Take 1 tablet (1 mg total) by mouth 4 (four) times daily as needed. What changed: when to take this   losartan   25 MG tablet Commonly known as: COZAAR  Take 12.5 mg by mouth 2 (two) times daily.   methadone  10 MG tablet Commonly known as: DOLOPHINE  Take 10-20 mg by mouth 3 (three) times daily. 10 MG BID and 20 mg HS   metoprolol  succinate 50 MG 24 hr tablet Commonly known as: TOPROL -XL Take 50 mg by mouth 2 (two) times daily.   multivitamin with minerals Tabs tablet Take 1 tablet by mouth daily.   Potassium Chloride  ER 20 MEQ Tbcr Take 20 mEq by mouth 2 (two) times daily. When taking torsemide    predniSONE  20 MG tablet Commonly known as: DELTASONE  Take 2 tablets (40 mg total) by mouth daily with breakfast for 4 days.   promethazine  25 MG tablet Commonly known as: PHENERGAN  Take 25 mg by mouth 3 (three) times daily.   spironolactone  50 MG tablet Commonly known as: ALDACTONE  Take 50 mg by mouth 2 (two) times daily.   torsemide  20 MG tablet Commonly known as: DEMADEX  Take 40 mg by mouth 2 (two) times daily.   traZODone  50 MG tablet Commonly known as: DESYREL  Take 50 mg by mouth at bedtime.   Vitamin D  (Ergocalciferol ) 1.25 MG (50000 UNIT) Caps capsule Commonly known as: DRISDOL Take 50,000 Units by mouth once a week.        Follow-up Information     Valora Lynwood FALCON, MD Follow up.   Specialty: Family Medicine Why: hospital follow up Contact information: [address] [place] [address] [phone]                Discharge Exam: Fredricka Weights   08/31/24 0722 09/01/24 0500  Weight: 90.7 kg 91.2 kg   General.  Obese lady, in no acute distress. Pulmonary.  Lungs clear bilaterally, normal respiratory effort. CV.  Regular rate and rhythm, no JVD, rub or murmur. Abdomen.  Soft, nontender, nondistended, BS positive. CNS.  Alert and oriented .  No focal neurologic deficit. Extremities.  No edema,  pulses intact and symmetrical. Psychiatry.  Judgment and insight appears normal.   Condition at discharge: stable  The results of significant  diagnostics from this hospitalization (including imaging, microbiology, ancillary and laboratory) are listed below for reference.   Imaging Studies: DG Chest Port 1 View Result Date: 08/31/2024 CLINICAL DATA:  Shortness of breath. EXAM: PORTABLE CHEST 1 VIEW COMPARISON:  08/02/2024 FINDINGS: The cardio pericardial silhouette is enlarged. The lungs are clear without focal pneumonia, edema, pneumothorax or pleural effusion. Left-sided permanent pacemaker/AICD again noted. Telemetry leads overlie the chest. IMPRESSION: Enlargement of the cardiopericardial silhouette. No acute cardiopulmonary findings. Electronically Signed   By: Camellia Candle M.D.   On: 08/31/2024 08:37   DG Chest 2 View Result Date: 08/02/2024 CLINICAL DATA:  Evaluate for pneumonia. EXAM: CHEST - 2 VIEW COMPARISON:  03/29/2024 FINDINGS: Stable appearance of the left chest ICD. Both lungs are clear.  No pulmonary edema. Heart size is normal. Trachea is midline. No large pleural effusions. IVC filter present. Degenerative changes in lower thoracic spine. IMPRESSION: No active cardiopulmonary disease. Electronically Signed   By: Juliene Balder M.D.   On: 08/02/2024 15:23   CT ABDOMEN PELVIS WO CONTRAST Result Date: 08/02/2024 CLINICAL DATA:  Vomiting and diarrhea for 2 days. EXAM: CT ABDOMEN AND PELVIS WITHOUT CONTRAST TECHNIQUE: Multidetector CT imaging of the abdomen and pelvis was performed following the standard protocol without IV contrast. RADIATION DOSE REDUCTION: This exam was performed according to the departmental dose-optimization program which includes automated exposure control, adjustment of the mA and/or kV according to patient size and/or use of iterative reconstruction technique. COMPARISON:  06/12/2024. FINDINGS: Lower chest: Clear lung bases. Hepatobiliary: Decreased liver attenuation consistent with fatty infiltration, similar to the prior study. Liver normal size. No mass. Status post cholecystectomy. No bile duct dilation.  Pancreas: Pancreatic atrophy.  No mass or inflammation.  No change. Spleen: Normal in size without focal abnormality. Adrenals/Urinary Tract: Adrenal glands are unremarkable. Kidneys are normal, without renal calculi, focal lesion, or hydronephrosis. Bladder is unremarkable. Stomach/Bowel: Stomach is unremarkable. Small bowel and colon are normal in caliber. No wall thickening. No inflammation. Low sigmoid colon anastomosis staple line is stable from the prior CT. Vascular/Lymphatic: Stable vena cava filter. No enlarged lymph nodes. Reproductive: Status post hysterectomy. No adnexal masses. Other: None. Musculoskeletal: No fracture or acute finding.  No bone lesion. IMPRESSION: 1. No acute findings. No bowel obstruction or inflammation. No findings to account for the patient's vomiting and diarrhea. 2. Hepatic steatosis. Electronically Signed   By: Alm Parkins M.D.   On: 08/02/2024 15:21    Microbiology: Results for orders placed or performed during the hospital encounter of 08/31/24  Resp panel by RT-PCR (RSV, Flu A&B, Covid) Anterior Nasal Swab     Status: None   Collection Time: 08/31/24  7:45 AM   Specimen: Anterior Nasal Swab  Result Value Ref Range Status   SARS Coronavirus 2 by RT PCR NEGATIVE NEGATIVE Final    Comment: (NOTE) SARS-CoV-2 target nucleic acids are NOT DETECTED.  The SARS-CoV-2 RNA is generally detectable in upper respiratory specimens during the acute phase of infection. The lowest concentration of SARS-CoV-2 viral copies this assay can detect is 138 copies/mL. A negative result does not preclude SARS-Cov-2 infection and should not be used as the sole basis for treatment or other patient management decisions. A negative result may occur with  improper specimen collection/handling, submission of specimen other than nasopharyngeal swab, presence of viral mutation(s) within the areas targeted by this assay, and inadequate number of viral copies(<138 copies/mL). A negative  result must be combined with clinical observations, patient history, and epidemiological information. The expected result is Negative.  Fact Sheet for Patients:  bloggercourse.com  Fact Sheet for Healthcare Providers:  seriousbroker.it  This test is no t yet approved or cleared by the [place]  FDA and  has been authorized for detection and/or diagnosis of SARS-CoV-2 by FDA under an Emergency Use Authorization (EUA). This EUA will remain  in effect (meaning this test can be used) for the duration of the COVID-19 declaration under Section 564(b)(1) of the Act, 21 U.S.C.section 360bbb-3(b)(1), unless the authorization is terminated  or revoked sooner.       Influenza A by PCR NEGATIVE NEGATIVE Final   Influenza B by PCR NEGATIVE NEGATIVE Final    Comment: (NOTE) The Xpert Xpress SARS-CoV-2/FLU/RSV plus assay is intended as an aid in the  diagnosis of influenza from Nasopharyngeal swab specimens and should not be used as a sole basis for treatment. Nasal washings and aspirates are unacceptable for Xpert Xpress SARS-CoV-2/FLU/RSV testing.  Fact Sheet for Patients: bloggercourse.com  Fact Sheet for Healthcare Providers: seriousbroker.it  This test is not yet approved or cleared by the [place]  FDA and has been authorized for detection and/or diagnosis of SARS-CoV-2 by FDA under an Emergency Use Authorization (EUA). This EUA will remain in effect (meaning this test can be used) for the duration of the COVID-19 declaration under Section 564(b)(1) of the Act, 21 U.S.C. section 360bbb-3(b)(1), unless the authorization is terminated or revoked.     Resp Syncytial Virus by PCR NEGATIVE NEGATIVE Final    Comment: (NOTE) Fact Sheet for Patients: bloggercourse.com  Fact Sheet for Healthcare Providers: seriousbroker.it  This  test is not yet approved or cleared by the [place]  FDA and has been authorized for detection and/or diagnosis of SARS-CoV-2 by FDA under an Emergency Use Authorization (EUA). This EUA will remain in effect (meaning this test can be used) for the duration of the COVID-19 declaration under Section 564(b)(1) of the Act, 21 U.S.C. section 360bbb-3(b)(1), unless the authorization is terminated or revoked.  Performed at [facility], [address] [place], [place] [address]   Respiratory (~20 pathogens) panel by PCR     Status: None   Collection Time: 08/31/24  3:05 PM   Specimen: Nasopharyngeal Swab; Respiratory  Result Value Ref Range Status   Adenovirus NOT DETECTED NOT DETECTED Final   Coronavirus 229E NOT DETECTED NOT DETECTED Final    Comment: (NOTE) The Coronavirus on the Respiratory Panel, DOES NOT test for the novel  Coronavirus (2019 nCoV)    Coronavirus HKU1 NOT DETECTED NOT DETECTED Final   Coronavirus NL63 NOT DETECTED NOT DETECTED Final   Coronavirus OC43 NOT DETECTED NOT DETECTED Final   Metapneumovirus NOT DETECTED NOT DETECTED Final   Rhinovirus / Enterovirus NOT DETECTED NOT DETECTED Final   Influenza A NOT DETECTED NOT DETECTED Final   Influenza B NOT DETECTED NOT DETECTED Final   Parainfluenza Virus 1 NOT DETECTED NOT DETECTED Final   Parainfluenza Virus 2 NOT DETECTED NOT DETECTED Final   Parainfluenza Virus 3 NOT DETECTED NOT DETECTED Final   Parainfluenza Virus 4 NOT DETECTED NOT DETECTED Final   Respiratory Syncytial Virus NOT DETECTED NOT DETECTED Final   Bordetella pertussis NOT DETECTED NOT DETECTED Final   Bordetella Parapertussis NOT DETECTED NOT DETECTED Final   Chlamydophila pneumoniae NOT DETECTED NOT DETECTED Final   Mycoplasma pneumoniae NOT DETECTED NOT DETECTED Final    Comment: Performed at [facility], [address], [place], [place] [address]    Labs: CBC: Recent Labs  Lab 08/31/24 0851 08/31/24 1255  09/01/24 0533  WBC 7.6 7.6 16.7*  HGB 9.3* 9.6* 9.8*  HCT 29.7* 31.3* 32.4*  MCV 79.0* 79.0* 80.4  PLT 237 251 310   Basic Metabolic Panel: Recent Labs  Lab 08/31/24 0745 08/31/24 1255 09/01/24 0533  NA 140  --  143  K 3.1*  --  4.2  CL 97*  --  99  CO2 32  --  33*  GLUCOSE 136*  --  203*  BUN 7  --  12  CREATININE 0.87 0.92 1.01*  CALCIUM  8.8*  --  8.4*  MG 2.2  --   --    Liver Function Tests: Recent Labs  Lab 08/31/24 0745  AST 28  ALT 14  ALKPHOS 106  BILITOT 0.3  PROT 7.1  ALBUMIN  4.0   CBG: Recent Labs  Lab 08/31/24 1606 08/31/24 2202 09/01/24 0802  GLUCAP 220* 191* 173*    Discharge time spent: greater than 30 minutes.  This record has been created using Conservation officer, historic buildings. Errors have been sought and corrected,but may not always be located. Such creation errors do not reflect on the standard of care.   Signed: Amaryllis Dare, MD [facility] 09/01/2024 "

## 2024-09-01 NOTE — Progress Notes (Signed)
 OT Cancellation Note  Patient Details Name: Brittany Nguyen MRN: [id number] DOB: 03-06-1966   Cancelled Treatment:    Reason Eval/Treat Not Completed: OT screened, no needs identified, will sign off. Pt awaiting discharge at this time, has been up with no further c/o dizziness. OT will complete orders, please re-consult if new needs arise.   [name] L. Jovanna Hodges, OTR/L  09/01/2024, 11:04 AM

## 2024-09-01 NOTE — Progress Notes (Signed)
 Initial Nutrition Assessment  DOCUMENTATION CODES:   Obesity unspecified  INTERVENTION:   -Liberalize diet to carb modified for wider variety of meal selections -MVI with minerals daily -Glucerna Shake po TID, each supplement provides 220 kcal and 10 grams of protein  -Consult placed to DM coordinator for hyperglycemia 2/2 steroids   NUTRITION DIAGNOSIS:   Increased nutrient needs related to chronic illness (COPD, CHF, GI cancer) as evidenced by estimated needs.  GOAL:   Patient will meet greater than or equal to 90% of their needs  MONITOR:   PO intake, Supplement acceptance  REASON FOR ASSESSMENT:   Consult Assessment of nutrition requirement/status  ASSESSMENT:   Brittany Nguyen is a 58 y.o. female with medical history significant of reactive airway disease, neuroendocrine GI tumor, diet-controlled diabetes, chronic pain syndrome, history of CAD and chronic HFrEF presented with complaint of worsening shortness of breath and wheezing for the past 2 to 3 days PTA.  Patient admitted with COPD exacerbation and CHF.  Reviewed I/O's: +1.1 L x 24 hours  Patient unavailable at time of visit. Attempted to speak with patient via call to hospital room phone, however, unable to reach. RD unable to obtain further nutrition-related history or complete nutrition-focused physical exam at this time.    Per H&P, patient is undergoing chemotherapy for neuroendocrine tumor. Her last treatment was on 08/27/24. She is followed by [facility]. She endorses poor appetite, nausea, vomiting, and diarrhea.   Patient currently on a heart healthy diet. No meal completion data available to assess at this time.   No weight loss noted over the past 2 months. Reviewed weight history over the past 6 months; weights have ranged from 88-91.2 kg. Suspect some weight fluctuations secondary to edema and history of CHF.   Medications reviewed and include lovenox , lexapro , prednisone , aldactone , and  demadex .  Lab Results  Component Value Date   HGBA1C 6.4 (H) 11/22/2023   PTA DM medications are none.   Labs reviewed: CBGS: 173-220 (inpatient orders for glycemic control are 0-15 units insulin  aspart TID with meals and 5 units insulin  aspart daily at bedtime). Suspect hyperglycemia related to steroids; consult placed to diabetes coordinator.   Diet Order:   Diet Order             Diet heart healthy/carb modified Fluid consistency: Thin  Diet effective now                   EDUCATION NEEDS:   No education needs have been identified at this time  Skin:  Skin Assessment: Reviewed RN Assessment  Last BM:  Unknown  Height:   Ht Readings from Last 1 Encounters:  08/31/24 5' 5 (1.651 m)    Weight:   Wt Readings from Last 1 Encounters:  09/01/24 91.2 kg    Ideal Body Weight:  56.8 kg  BMI:  Body mass index is 33.46 kg/m.  Estimated Nutritional Needs:   Kcal:  1700-1900  Protein:  90-105 grams  Fluid:  1.7-1.9 L    Margery ORN, RD, LDN, CDCES Registered Dietitian III Certified Diabetes Care and Education Specialist If unable to reach this RD, please use RD Inpatient group chat on secure chat between hours of 8am-4 pm daily

## 2024-09-20 ENCOUNTER — Observation Stay
Admission: EM | Admit: 2024-09-20 | Discharge: 2024-09-21 | Disposition: A | Discharge status: Home / Self Care | Attending: Emergency Medicine | Admitting: Emergency Medicine

## 2024-09-20 ENCOUNTER — Encounter: Payer: Self-pay | Admitting: Oncology

## 2024-09-20 ENCOUNTER — Emergency Department

## 2024-09-20 ENCOUNTER — Other Ambulatory Visit: Payer: Self-pay

## 2024-09-20 DIAGNOSIS — Z853 Personal history of malignant neoplasm of breast: Secondary | ICD-10-CM | POA: Insufficient documentation

## 2024-09-20 DIAGNOSIS — E876 Hypokalemia: Secondary | ICD-10-CM | POA: Insufficient documentation

## 2024-09-20 DIAGNOSIS — I480 Paroxysmal atrial fibrillation: Secondary | ICD-10-CM | POA: Diagnosis not present

## 2024-09-20 DIAGNOSIS — G40909 Epilepsy, unspecified, not intractable, without status epilepticus: Secondary | ICD-10-CM | POA: Diagnosis not present

## 2024-09-20 DIAGNOSIS — I5022 Chronic systolic (congestive) heart failure: Secondary | ICD-10-CM | POA: Diagnosis not present

## 2024-09-20 DIAGNOSIS — E039 Hypothyroidism, unspecified: Secondary | ICD-10-CM | POA: Diagnosis not present

## 2024-09-20 DIAGNOSIS — I2089 Other forms of angina pectoris: Secondary | ICD-10-CM | POA: Diagnosis not present

## 2024-09-20 DIAGNOSIS — Z95810 Presence of automatic (implantable) cardiac defibrillator: Secondary | ICD-10-CM | POA: Diagnosis not present

## 2024-09-20 DIAGNOSIS — Z79899 Other long term (current) drug therapy: Secondary | ICD-10-CM | POA: Diagnosis not present

## 2024-09-20 DIAGNOSIS — Z91040 Latex allergy status: Secondary | ICD-10-CM | POA: Insufficient documentation

## 2024-09-20 DIAGNOSIS — G4733 Obstructive sleep apnea (adult) (pediatric): Secondary | ICD-10-CM | POA: Insufficient documentation

## 2024-09-20 DIAGNOSIS — J449 Chronic obstructive pulmonary disease, unspecified: Secondary | ICD-10-CM | POA: Diagnosis not present

## 2024-09-20 DIAGNOSIS — Z6841 Body Mass Index (BMI) 40.0 and over, adult: Secondary | ICD-10-CM | POA: Insufficient documentation

## 2024-09-20 DIAGNOSIS — E119 Type 2 diabetes mellitus without complications: Secondary | ICD-10-CM | POA: Insufficient documentation

## 2024-09-20 DIAGNOSIS — J45909 Unspecified asthma, uncomplicated: Secondary | ICD-10-CM | POA: Insufficient documentation

## 2024-09-20 DIAGNOSIS — R0602 Shortness of breath: Secondary | ICD-10-CM | POA: Diagnosis present

## 2024-09-20 DIAGNOSIS — C50919 Malignant neoplasm of unspecified site of unspecified female breast: Secondary | ICD-10-CM | POA: Diagnosis present

## 2024-09-20 DIAGNOSIS — I214 Non-ST elevation (NSTEMI) myocardial infarction: Principal | ICD-10-CM

## 2024-09-20 DIAGNOSIS — Z96653 Presence of artificial knee joint, bilateral: Secondary | ICD-10-CM | POA: Insufficient documentation

## 2024-09-20 DIAGNOSIS — I2510 Atherosclerotic heart disease of native coronary artery without angina pectoris: Secondary | ICD-10-CM | POA: Diagnosis present

## 2024-09-20 DIAGNOSIS — C7A092 Malignant carcinoid tumor of the stomach: Secondary | ICD-10-CM | POA: Insufficient documentation

## 2024-09-20 DIAGNOSIS — Z86718 Personal history of other venous thrombosis and embolism: Secondary | ICD-10-CM | POA: Insufficient documentation

## 2024-09-20 DIAGNOSIS — I4891 Unspecified atrial fibrillation: Secondary | ICD-10-CM | POA: Diagnosis present

## 2024-09-20 DIAGNOSIS — I428 Other cardiomyopathies: Secondary | ICD-10-CM | POA: Diagnosis not present

## 2024-09-20 DIAGNOSIS — R079 Chest pain, unspecified: Principal | ICD-10-CM | POA: Diagnosis present

## 2024-09-20 DIAGNOSIS — Z8616 Personal history of COVID-19: Secondary | ICD-10-CM | POA: Diagnosis not present

## 2024-09-20 DIAGNOSIS — I10 Essential (primary) hypertension: Secondary | ICD-10-CM | POA: Diagnosis present

## 2024-09-20 DIAGNOSIS — G2581 Restless legs syndrome: Secondary | ICD-10-CM | POA: Diagnosis not present

## 2024-09-20 DIAGNOSIS — D3A092 Benign carcinoid tumor of the stomach: Secondary | ICD-10-CM | POA: Diagnosis present

## 2024-09-20 DIAGNOSIS — I82409 Acute embolism and thrombosis of unspecified deep veins of unspecified lower extremity: Secondary | ICD-10-CM | POA: Diagnosis present

## 2024-09-20 DIAGNOSIS — I110 Hypertensive heart disease with heart failure: Secondary | ICD-10-CM | POA: Diagnosis not present

## 2024-09-20 DIAGNOSIS — I509 Heart failure, unspecified: Secondary | ICD-10-CM

## 2024-09-20 LAB — CBC
HCT: 32.9 % — ABNORMAL LOW (ref 36.0–46.0)
Hemoglobin: 10.4 g/dL — ABNORMAL LOW (ref 12.0–15.0)
MCH: 25 pg — ABNORMAL LOW (ref 26.0–34.0)
MCHC: 31.6 g/dL (ref 30.0–36.0)
MCV: 79.1 fL — ABNORMAL LOW (ref 80.0–100.0)
Platelets: 266 K/uL (ref 150–400)
RBC: 4.16 MIL/uL (ref 3.87–5.11)
RDW: 19 % — ABNORMAL HIGH (ref 11.5–15.5)
WBC: 11.6 K/uL — ABNORMAL HIGH (ref 4.0–10.5)
nRBC: 0 % (ref 0.0–0.2)

## 2024-09-20 LAB — BASIC METABOLIC PANEL WITH GFR
Anion gap: 12 (ref 5–15)
BUN: 11 mg/dL (ref 6–20)
CO2: 34 mmol/L — ABNORMAL HIGH (ref 22–32)
Calcium: 9.3 mg/dL (ref 8.9–10.3)
Chloride: 95 mmol/L — ABNORMAL LOW (ref 98–111)
Creatinine, Ser: 0.97 mg/dL (ref 0.44–1.00)
GFR, Estimated: >60 mL/min
Glucose, Bld: 166 mg/dL — ABNORMAL HIGH (ref 70–99)
Potassium: 3.1 mmol/L — ABNORMAL LOW (ref 3.5–5.1)
Sodium: 140 mmol/L (ref 135–145)

## 2024-09-20 LAB — RESP PANEL BY RT-PCR (RSV, FLU A&B, COVID)  RVPGX2
Influenza A by PCR: NEGATIVE
Influenza B by PCR: NEGATIVE
Resp Syncytial Virus by PCR: NEGATIVE
SARS Coronavirus 2 by RT PCR: NEGATIVE

## 2024-09-20 LAB — MAGNESIUM: Magnesium: 1.9 mg/dL (ref 1.7–2.4)

## 2024-09-20 LAB — HEPARIN LEVEL (UNFRACTIONATED): Heparin Unfractionated: 0.36 [IU]/mL (ref 0.30–0.70)

## 2024-09-20 LAB — TROPONIN T, HIGH SENSITIVITY
Troponin T High Sensitivity: 132 ng/L (ref 0–19)
Troponin T High Sensitivity: 174 ng/L (ref 0–19)

## 2024-09-20 LAB — PROTIME-INR
INR: 1 (ref 0.8–1.2)
Prothrombin Time: 13.4 s (ref 11.4–15.2)

## 2024-09-20 LAB — APTT: aPTT: 27 s (ref 24–36)

## 2024-09-20 MED ORDER — LEVOTHYROXINE SODIUM 25 MCG PO TABS: 25.0000 ug | ORAL_TABLET | Freq: Every morning | ORAL | Status: DC

## 2024-09-20 MED ORDER — HEPARIN BOLUS VIA INFUSION
4000.0000 [IU] | Freq: Once | INTRAVENOUS | Status: AC
  Administered 2024-09-20: 4000 [IU] via INTRAVENOUS
  Filled 2024-09-20: qty 4000

## 2024-09-20 MED ORDER — KETOROLAC TROMETHAMINE 15 MG/ML IJ SOLN
15.0000 mg | Freq: Once | INTRAMUSCULAR | Status: AC
  Administered 2024-09-20: 15 mg via INTRAVENOUS
  Filled 2024-09-20: qty 1

## 2024-09-20 MED ORDER — NITROGLYCERIN 0.4 MG SL SUBL: 0.4000 mg | SUBLINGUAL_TABLET | SUBLINGUAL | Status: DC | PRN

## 2024-09-20 MED ORDER — HEPARIN (PORCINE) 25000 UT/250ML-% IV SOLN
1200.0000 [IU]/h | INTRAVENOUS | Status: DC
  Administered 2024-09-20: 1200 [IU]/h via INTRAVENOUS
  Filled 2024-09-20 (×2): qty 250

## 2024-09-20 MED ORDER — LACOSAMIDE 100 MG PO TABS: 200.0000 mg | ORAL_TABLET | Freq: Two times a day (BID) | ORAL | Status: DC

## 2024-09-20 MED ORDER — ALBUTEROL SULFATE (2.5 MG/3ML) 0.083% IN NEBU
5.0000 mg | INHALATION_SOLUTION | Freq: Once | RESPIRATORY_TRACT | Status: AC
  Administered 2024-09-20: 5 mg via RESPIRATORY_TRACT
  Filled 2024-09-20: qty 6

## 2024-09-20 MED ORDER — ASPIRIN 81 MG PO CHEW
324.0000 mg | CHEWABLE_TABLET | Freq: Once | ORAL | Status: AC
  Administered 2024-09-20: 324 mg via ORAL
  Filled 2024-09-20: qty 4

## 2024-09-20 MED ORDER — ACETAMINOPHEN 500 MG PO TABS
1000.0000 mg | ORAL_TABLET | Freq: Once | ORAL | Status: AC
  Administered 2024-09-20: 1000 mg via ORAL
  Filled 2024-09-20: qty 2

## 2024-09-20 MED ORDER — OXYCODONE HCL 5 MG PO TABS
5.0000 mg | ORAL_TABLET | ORAL | Status: DC | PRN
  Administered 2024-09-20 – 2024-09-21 (×2): 5 mg via ORAL
  Filled 2024-09-20 (×2): qty 1

## 2024-09-20 MED ORDER — TRAZODONE HCL 50 MG PO TABS: 50.0000 mg | ORAL_TABLET | Freq: Every day | ORAL | Status: DC

## 2024-09-20 MED ORDER — METOPROLOL SUCCINATE ER 50 MG PO TB24
50.0000 mg | ORAL_TABLET | Freq: Two times a day (BID) | ORAL | Status: DC
  Administered 2024-09-20 – 2024-09-21 (×2): 50 mg via ORAL
  Filled 2024-09-20 (×2): qty 1

## 2024-09-20 MED ORDER — TORSEMIDE 20 MG PO TABS: 40.0000 mg | ORAL_TABLET | Freq: Two times a day (BID) | ORAL | Status: DC

## 2024-09-20 MED ORDER — LEVOTHYROXINE SODIUM 100 MCG PO TABS
200.0000 ug | ORAL_TABLET | Freq: Every day | ORAL | Status: DC
  Administered 2024-09-21: 200 ug via ORAL
  Filled 2024-09-20: qty 2

## 2024-09-20 MED ORDER — ACETAMINOPHEN 325 MG PO TABS: 650.0000 mg | ORAL_TABLET | Freq: Four times a day (QID) | ORAL | Status: DC | PRN

## 2024-09-20 MED ORDER — ONDANSETRON HCL 4 MG/2ML IJ SOLN
4.0000 mg | Freq: Once | INTRAMUSCULAR | Status: AC
  Administered 2024-09-20: 4 mg via INTRAVENOUS
  Filled 2024-09-20: qty 2

## 2024-09-20 MED ORDER — ROPINIROLE HCL 1 MG PO TABS
4.0000 mg | ORAL_TABLET | Freq: Every day | ORAL | Status: DC
  Administered 2024-09-20: 4 mg via ORAL
  Filled 2024-09-20: qty 4

## 2024-09-20 MED ORDER — SPIRONOLACTONE 25 MG PO TABS
50.0000 mg | ORAL_TABLET | Freq: Two times a day (BID) | ORAL | Status: DC
  Administered 2024-09-20 – 2024-09-21 (×2): 50 mg via ORAL
  Filled 2024-09-20 (×2): qty 2

## 2024-09-20 MED ORDER — TECHNETIUM TO 99M ALBUMIN AGGREGATED
4.0000 | Freq: Once | INTRAVENOUS | Status: AC | PRN
  Administered 2024-09-20: 4.33 via INTRAVENOUS

## 2024-09-20 MED ORDER — SODIUM CHLORIDE 0.9 % IV BOLUS: 1000.0000 mL | Freq: Once | INTRAVENOUS | Status: DC

## 2024-09-20 MED ORDER — SODIUM CHLORIDE 0.9 % IV BOLUS
500.0000 mL | Freq: Once | INTRAVENOUS | Status: AC
  Administered 2024-09-20: 500 mL via INTRAVENOUS

## 2024-09-20 MED ORDER — ACETAMINOPHEN 650 MG RE SUPP: 650.0000 mg | Freq: Four times a day (QID) | RECTAL | Status: DC | PRN

## 2024-09-20 MED ORDER — POLYETHYLENE GLYCOL 3350 17 G PO PACK: 17.0000 g | PACK | Freq: Every day | ORAL | Status: DC | PRN

## 2024-09-20 MED ORDER — POTASSIUM CHLORIDE CRYS ER 20 MEQ PO TBCR
20.0000 meq | EXTENDED_RELEASE_TABLET | Freq: Two times a day (BID) | ORAL | Status: DC
  Administered 2024-09-20 – 2024-09-21 (×2): 20 meq via ORAL
  Filled 2024-09-20 (×2): qty 1

## 2024-09-20 MED ORDER — GUAIFENESIN 100 MG/5ML PO LIQD
10.0000 mL | ORAL | Status: DC | PRN
  Administered 2024-09-21: 10 mL via ORAL
  Filled 2024-09-20: qty 10

## 2024-09-20 MED ORDER — LOSARTAN POTASSIUM 25 MG PO TABS
12.5000 mg | ORAL_TABLET | Freq: Two times a day (BID) | ORAL | Status: DC
  Administered 2024-09-20 – 2024-09-21 (×2): 12.5 mg via ORAL
  Filled 2024-09-20 (×2): qty 0.5

## 2024-09-20 MED ORDER — MORPHINE SULFATE (PF) 2 MG/ML IV SOLN
2.0000 mg | INTRAVENOUS | Status: DC | PRN
  Administered 2024-09-20 – 2024-09-21 (×4): 2 mg via INTRAVENOUS
  Filled 2024-09-20 (×4): qty 1

## 2024-09-20 MED ORDER — GABAPENTIN 300 MG PO CAPS
300.0000 mg | ORAL_CAPSULE | Freq: Three times a day (TID) | ORAL | Status: DC
  Administered 2024-09-20 – 2024-09-21 (×2): 300 mg via ORAL
  Filled 2024-09-20 (×2): qty 1

## 2024-09-20 MED ORDER — ENOXAPARIN SODIUM 40 MG/0.4ML IJ SOSY: 40.0000 mg | PREFILLED_SYRINGE | INTRAMUSCULAR | Status: DC

## 2024-09-20 NOTE — ED Provider Notes (Signed)
 "  [facility] Provider Note    Event Date/Time   First MD Initiated Contact with Patient 09/20/24 1051     (approximate)   History   Chief Complaint: Shortness of Breath   HPI  Brittany Nguyen is a 59 y.o. female with a history of CHF, AICD, COPD, gastric carcinoid tumor, paroxysmal A-fib who comes ED complaining of centralized chest pain radiating to the back and shortness of breath for the past week, constant, worsening, associated with nonproductive cough.  Painful to breathe and cough.  Currently on infusion treatment therapy for carcinoid tumor.  Has had some decreased appetite, no vomiting or diarrhea.        Past Medical History:  Diagnosis Date   Acute anterolateral wall MI (HCC) 08/2016   Acute respiratory failure (HCC)    AICD (automatic cardioverter/defibrillator) present 03/29/2017   a.) Medtronic Evera MRI XT DR SureScan (SN: [id number])   Anxiety    a.) on BZO PRN (lorazepam )   Arthritis    Asthma 2013   Breast cancer, right (HCC)    C. 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   a.) Bx 02/22/2018 --> NE tumor cells --> AE1/AE3, chromogranin, synaptophysin (+); Ki-67 proliferation rate <1%; b.) repeat Bx 08/24/2018 --> well differentiard NET (G1)   CHF (congestive heart failure) (HCC)    Chronic, continuous use of opioids    a.) has naloxone  Rx available   COPD (chronic obstructive pulmonary disease) (HCC)    Coronary artery disease    Depression    Diet-controlled type 2 diabetes mellitus (HCC)    Diverticulitis 2010   DVT (deep venous thrombosis) (HCC)    Endometriosis 1990   Generalized epilepsy (HCC)    a.) on lacosamide ; b.) seizure 07-30-22 in setting of hypokalemia (K+ 2.6); c.) last seizure 11/13/2023 during blood draw   GERD (gastroesophageal reflux disease)    GIB (gastrointestinal bleeding) 01/08/2018   H/O syncope    HFrEF (heart failure with reduced ejection  fraction) (HCC)    a.)TTE 12/09/16: EF 25-30%, dif HK; b.) TTE 02/08/17: EF 25%, dif HK, mild TR, G1DD; c.) TTE 04/25/17: EF 30-35%, mild LVH; d.) TTE 01/30/18: EF 45%, MAC, LAE, mild MR, G1DD; e.) TTE 11/06/2018: EF 25%, sev glob HK, triv MR/TR/PR; f.) TTE 05/12/19: EF 35%, MAC, AoV sclerosis, G1DD; g.) TTE 11/05/19: EF 20%, sev glob HK, triv MR; h.) TTE 06/17/20: EF 40%, mod glob HK, triv MR; I:) TTE 06/14/22: EF 25-30%, G1DD   History of cardiac catheterization    a.) LHC 10/21/2014: normal coronaries; b.) [facility] 02/24/2017: normal coronaries, mRA 12, mPA 28, mPCWP 15, CO 8.0, CI 3.8   History of kidney stones    Hx MRSA infection    Hypertension    Hypokalemia    Hypothyroidism    a.) s/p thyroidectomy   Incomplete left bundle branch block (LBBB)    Iron  deficiency anemia 03/27/2018   Lump or mass in breast 11/27/2012   a.) Bx 11/27/2012 --> apocrine wall cyst   Migraine    Nausea & vomiting    Neuropathy    Non-ischemic cardiomyopathy (HCC)    a.) TTE 12/09/2016: EF 25-30%, b.) TTE 02/08/2017: EF 25%; c.) Medtronic AICD placed 03/29/2017; d.) TTE 04/25/2017: EF 30-35%; e.) TTE 01/30/2018: EF 45%; f.) TTE 11/06/2018: EF 25%; g.) TTE 05/12/2019: EF 35%; h.) TTE 11/05/2019: EF 20%; I.) TTE 06/17/2020: EF 40%; J.) TTE 06/14/2022: EF 25-30%   Obesity  OSA on CPAP    PAF (paroxysmal atrial fibrillation) (HCC)    Pneumonia    PONV (postoperative nausea and vomiting)    Post-COVID chronic cough    Postcholecystectomy diarrhea    Prolonged Q-T interval on ECG    RAD (reactive airway disease)    Restless leg    a.) on ropinirole    Sepsis (HCC)    Unstable angina [facility])     Current Outpatient Rx   Order #: [id number] Class: Historical Med   Order #: [id number] Class: Historical Med   Order #: [id number] Class: Normal   Order #: [id number] Class: Historical Med   Order #: [id number] Class: Historical Med   Order #: [id number] Class: Normal   Order #: [id number] Class: No Print   Order #: [id number] Class:  Normal   Order #: [id number] Class: Historical Med   Order #: [id number] Class: Historical Med   Order #: [id number] Class: Historical Med   Order #: [id number] Class: Normal   Order #: [id number] Class: Historical Med   Order #: [id number] Class: Historical Med   Order #: [id number] Class: Historical Med   Order #: [id number] Class: Normal   Order #: [id number] Class: Historical Med   Order #: [id number] Class: Historical Med   Order #: [id number] Class: Historical Med   Order #: [id number] Class: Historical Med   Order #: [id number] Class: Historical Med   Order #: [id number] Class: Historical Med   Order #: [id number] Class: Historical Med    Past Surgical History:  Procedure Laterality Date   ABDOMINAL HYSTERECTOMY     age 44   BREAST BIOPSY Right 2014   benign   BREAST BIOPSY Right 01/18/2023   Us  Core Bx, coil clip - path pending   BREAST BIOPSY Right 01/18/2023   US  RT BREAST BX W LOC DEV 1ST LESION IMG BX SPEC US  GUIDE 01/18/2023 [facility]   CARDIAC DEFIBRILLATOR PLACEMENT Left 03/29/2017   Procedure: MEDTRONIC CARDIAC DEFIBRILLATOR PLACEMENT (AICD); Location: [facility]; Surgeon: Lane Beard, MD   CESAREAN SECTION     CHOLECYSTECTOMY     COLECTOMY     COLONOSCOPY WITH PROPOFOL  N/A 02/22/2018   Procedure: COLONOSCOPY WITH PROPOFOL ;  Surgeon: Toledo, Ladell POUR, MD;  Location: [facility] ENDOS[facility];  Service: Gastroenterology;  Laterality: N/A;   ESOPHAGOGASTRODUODENOSCOPY (EGD) WITH PROPOFOL  N/A 02/22/2018   Procedure: ESOPHAGOGASTRODUODENOSCOPY (EGD) WITH PROPOFOL ;  Surgeon: Toledo, Ladell POUR, MD;  Location: [facility];  Service: Gastroenterology;  Laterality: N/A;   IVC FILTER INSERTION N/A 08/23/2022   Procedure: IVC FILTER INSERTION;  Surgeon: Jama Cordella MATSU, MD;  Location: [facility];  Service: Cardiovascular;  Laterality: N/A;   IVC FILTER REMOVAL N/A 03/07/2023   Procedure: IVC FILTER REMOVAL;  Surgeon: Jama Cordella MATSU, MD;  Location: [facility];  Service:  Cardiovascular;  Laterality: N/A;   KNEE ARTHROPLASTY Left 08/24/2022   Procedure: COMPUTER ASSISTED TOTAL KNEE ARTHROPLASTY;  Surgeon: Mardee Lynwood SQUIBB, MD;  Location: [facility] OR[facility];  Service: Orthopedics;  Laterality: Left;   KNEE ARTHROPLASTY Right 12/01/2023   Procedure: COMPUTER ASSISTED TOTAL KNEE ARTHROPLASTY;  Surgeon: Mardee Lynwood SQUIBB, MD;  Location: [facility] OR[facility];  Service: Orthopedics;  Laterality: Right;   LEFT HEART CATH AND CORONARY ANGIOGRAPHY Left 10/21/2014   Procedure: LEFT HEART CATH AND CORONARY ANGIOGRAPHY; Location: [facility]; Surgeon: Wolm Rhyme, MD   NASAL SINUS SURGERY  2012   OOPHORECTOMY     RIGHT/LEFT HEART CATH AND CORONARY ANGIOGRAPHY Bilateral 02/24/2017   Procedure: RIGHT/LEFT HEART CATH AND CORONARY ANGIOGRAPHY; Location: [facility]   THYROIDECTOMY      Physical Exam   Triage Vital Signs: ED Triage Vitals [09/20/24  0917]  Encounter Vitals Group     BP 113/86     Girls Systolic BP Percentile      Girls Diastolic BP Percentile      Boys Systolic BP Percentile      Boys Diastolic BP Percentile      Pulse Rate 99     Resp (!) 22     Temp 98.1 F (36.7 C)     Temp Source Oral     SpO2 95 %     Weight      Height      Head Circumference      Peak Flow      Pain Score 9     Pain Loc      Pain Education      Exclude from Growth Chart     Most recent vital signs: Vitals:   09/20/24 0917 09/20/24 1055  BP: 113/86   Pulse: 99   Resp: (!) 22   Temp: 98.1 F (36.7 C)   SpO2: 95% 96%    General: Awake, no distress.  CV:  Good peripheral perfusion.  Tachycardia heart rate 100 Resp:  Tachypnea, respiratory rate 22.  Lungs clear to auscultation bilaterally.  Wheezy upper airway sounds with coughing. Abd:  No distention.  Soft nontender Other:  No calf tenderness.  Symmetric calf circumference.  Dry oral mucosa.   ED Results / Procedures / Treatments   Labs (all labs ordered are listed, but only abnormal results are displayed) Labs Reviewed  BASIC METABOLIC  PANEL WITH GFR - Abnormal; Notable for the following components:      Result Value   Potassium 3.1 (*)    Chloride 95 (*)    CO2 34 (*)    Glucose, Bld 166 (*)    All other components within normal limits  CBC - Abnormal; Notable for the following components:   WBC 11.6 (*)    Hemoglobin 10.4 (*)    HCT 32.9 (*)    MCV 79.1 (*)    MCH 25.0 (*)    RDW 19.0 (*)    All other components within normal limits  TROPONIN T, HIGH SENSITIVITY - Abnormal; Notable for the following components:   Troponin T High Sensitivity 132 (*)    All other components within normal limits  TROPONIN T, HIGH SENSITIVITY - Abnormal; Notable for the following components:   Troponin T High Sensitivity 174 (*)    All other components within normal limits  RESP PANEL BY RT-PCR (RSV, FLU A&B, COVID)  RVPGX2  MAGNESIUM   APTT  PROTIME-INR     EKG Interpreted by me Sinus tachycardia rate 101.  Normal axis.  First-degree AV block.  Poor R wave progression.  Normal QT interval visually, contrary to automated report from machine.  Machine read is prone to error due to difficult to discern T waves.   RADIOLOGY Chest x-ray interpreted by me, unremarkable.  Radiology report reviewed   PROCEDURES:  .Critical Care  Performed by: Viviann Pastor, MD Authorized by: Viviann Pastor, MD   Critical care provider statement:    Critical care time (minutes):  35   Critical care time was exclusive of:  Separately billable procedures and treating other patients   Critical care was necessary to treat or prevent imminent or life-threatening deterioration of the following conditions:  Cardiac failure   Critical care was time spent personally by me on the following activities:  Development of treatment plan with patient or surrogate, discussions with consultants, evaluation of  patient's response to treatment, examination of patient, obtaining history from patient or surrogate, ordering and performing treatments and  interventions, ordering and review of laboratory studies, ordering and review of radiographic studies, pulse oximetry, re-evaluation of patient's condition and review of old charts   Care discussed with: admitting provider      MEDICATIONS ORDERED IN ED: Medications  aspirin  chewable tablet 324 mg (has no administration in time range)  nitroGLYCERIN  (NITROSTAT ) SL tablet 0.4 mg (has no administration in time range)  acetaminophen  (TYLENOL ) tablet 1,000 mg (has no administration in time range)  heparin  bolus via infusion 4,000 Units (has no administration in time range)  heparin  ADULT infusion 100 units/mL (25000 units/250mL) (has no administration in time range)  ondansetron  (ZOFRAN ) injection 4 mg (4 mg Intravenous Given 09/20/24 1135)  ketorolac  (TORADOL ) 15 MG/ML injection 15 mg (15 mg Intravenous Given 09/20/24 1135)  albuterol  (PROVENTIL ) (2.5 MG/3ML) 0.083% nebulizer solution 5 mg (5 mg Nebulization Given 09/20/24 1136)  sodium chloride  0.9 % bolus 500 mL (0 mLs Intravenous Stopped 09/20/24 1229)  technetium albumin  aggregated (MAA) injection solution 4 millicurie (4.33 millicuries Intravenous Contrast Given 09/20/24 1223)     IMPRESSION / MDM / ASSESSMENT AND PLAN / ED COURSE  I reviewed the triage vital signs and the nursing notes.  DDx: Pulmonary embolism, non-STEMI, COVID, influenza, electrolyte derangement, AKI, pneumothorax, pneumonia  Patient's presentation is most consistent with acute presentation with potential threat to life or bodily function.  Patient presents with chest pain shortness of breath, elevated troponin.  Will trend troponin, obtain nuc med perfusion study to evaluate for PE versus NSTEMI, plan to admit.  Will provide albuterol , Toradol , Zofran  for symptomatic relief.   ----------------------------------------- 1:42 PM on 09/20/2024 ----------------------------------------- Troponin uptrending from 130-170.  Nuclear medicine perfusion study negative for PE.   Heparin  ordered, case discussed with hospitalist for further cardiac workup.      FINAL CLINICAL IMPRESSION(S) / ED DIAGNOSES   Final diagnoses:  NSTEMI (non-ST elevated myocardial infarction) (HCC)  Chronic congestive heart failure, unspecified heart failure type (HCC)  Chronic obstructive pulmonary disease, unspecified COPD type (HCC)     Rx / DC Orders   ED Discharge Orders     None        Note:  This document was prepared using Dragon voice recognition software and may include unintentional dictation errors.   Viviann Pastor, MD 09/20/24 1343  "

## 2024-09-20 NOTE — Progress Notes (Signed)
 PHARMACY - ANTICOAGULATION CONSULT NOTE  Pharmacy Consult for heparin  infusion Indication: chest pain/ACS  Allergies[1]  Patient Measurements: weight 90.7 kg 09/01/24    Vital Signs: Temp: 98.1 F (36.7 C) (01/16 0917) Temp Source: Oral (01/16 0917) BP: 113/86 (01/16 0917) Pulse Rate: 99 (01/16 0917)  Labs: Recent Labs    09/20/24 0920  HGB 10.4*  HCT 32.9*  PLT 266  CREATININE 0.97    CrCl cannot be calculated (Unknown ideal weight.).   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   a.) Bx 02/22/2018 --> NE tumor cells --> AE1/AE3, chromogranin, synaptophysin (+); Ki-67 proliferation rate <1%; b.) repeat Bx 08/24/2018 --> well differentiard NET (G1)   CHF (congestive heart failure) (HCC)    Chronic, continuous use of opioids    a.) has naloxone  Rx available   COPD (chronic obstructive pulmonary disease) (HCC)    Coronary artery disease    Depression    Diet-controlled type 2 diabetes mellitus (HCC)    Diverticulitis 2010   DVT (deep venous thrombosis) (HCC)    Endometriosis 1990   Generalized epilepsy (HCC)    a.) on lacosamide ; b.) seizure 07-30-22 in setting of hypokalemia (K+ 2.6); c.) last seizure 11/13/2023 during blood draw   GERD (gastroesophageal reflux disease)    GIB (gastrointestinal bleeding) 01/08/2018   H/O syncope    HFrEF (heart failure with reduced ejection fraction) (HCC)    a.)TTE 12/09/16: EF 25-30%, dif HK; b.) TTE 02/08/17: EF 25%, dif HK, mild TR, G1DD; c.) TTE 04/25/17: EF 30-35%, mild LVH; d.) TTE 01/30/18: EF 45%, MAC, LAE, mild MR,  G1DD; e.) TTE 11/06/2018: EF 25%, sev glob HK, triv MR/TR/PR; f.) TTE 05/12/19: EF 35%, MAC, AoV sclerosis, G1DD; g.) TTE 11/05/19: EF 20%, sev glob HK, triv MR; h.) TTE 06/17/20: EF 40%, mod glob HK, triv MR; I:) TTE 06/14/22: EF 25-30%, G1DD   History of cardiac catheterization    a.) LHC 10/21/2014: normal coronaries; b.) [facility] 02/24/2017: normal coronaries, mRA 12, mPA 28, mPCWP 15, CO 8.0, CI 3.8   History of kidney stones    Hx MRSA infection    Hypertension    Hypokalemia    Hypothyroidism    a.) s/p thyroidectomy   Incomplete left bundle branch block (LBBB)    Iron  deficiency anemia 03/27/2018   Lump or mass in breast 11/27/2012   a.) Bx 11/27/2012 --> apocrine wall cyst   Migraine    Nausea & vomiting    Neuropathy    Non-ischemic cardiomyopathy (HCC)    a.) TTE 12/09/2016: EF 25-30%, b.) TTE 02/08/2017: EF 25%; c.) Medtronic AICD placed 03/29/2017; d.) TTE 04/25/2017: EF 30-35%; e.) TTE 01/30/2018: EF 45%; f.) TTE 11/06/2018: EF 25%; g.) TTE 05/12/2019: EF 35%; h.) TTE 11/05/2019: EF 20%; I.) TTE 06/17/2020: EF 40%; J.) TTE 06/14/2022: EF 25-30%   Obesity    OSA on CPAP    PAF (paroxysmal atrial fibrillation) (HCC)    Pneumonia    PONV (postoperative nausea and vomiting)    Post-COVID chronic cough    Postcholecystectomy diarrhea  Prolonged Q-T interval on ECG    RAD (reactive airway disease)    Restless leg    a.) on ropinirole    Sepsis (HCC)    Unstable angina (HCC)     Medications:  Scheduled:   acetaminophen   1,000 mg Oral Once   aspirin   324 mg Oral Once   Infusions:  PRN: nitroGLYCERIN   Assessment: 59 yo female w/ PMH of anxiety, CHF, COPD, depression, HTN, hypothyroidism, RLS, seizure d/o presenting to ED reporting of chest pain. A review of medical records reveals no chronic anticoagulation prior to admission  Baseline Labs: Hgb 10.4, PLT 266, INR & aPTT pending  Goal of Therapy:  anti-Xa level  0.3-0.7 units/ml Monitor platelets by anticoagulation  protocol: Yes   Plan:  --will give 4000 units bolus x 1 --Start heparin  infusion at 1200 units/hr (rate based on data from recent admission) --Check anti-Xa level in 6 hours  --Continue to monitor H&H and platelets  Brittany Nguyen 09/20/2024,1:30 PM      [1]  Allergies Allergen Reactions   Contrast Media [Iodinated Contrast Media] Shortness Of Breath    Per CT report in 2014 pt had break through contrast reaction with hives and SOB following 13 hour prep. MSY    Ferumoxytol  Nausea Only   Iron  Sucrose Anaphylaxis   Lidocaine  Hives   Metrizamide Shortness Of Breath   Penicillins Hives and Other (See Comments)    Tolerated ceftriaxone  in past IgE = 4 (WNL) on 08/12/2022  ediate rash, facial/tongue/throat swelling, SOB or lightheadedness with hypotension, tachy   Sacubitril-Valsartan     Sz (unclear if new start entresto vs hypoglycemia)   Latex Hives    IgE < 0.10 (WNL) on 08/21/2023   Tizanidine  Other (See Comments)    Feels altered   Zofran  [Ondansetron ] Other (See Comments)    Migraine per pt   Povidone-Iodine  Rash    Blistering rash   Pulmicort  [Budesonide ] Itching

## 2024-09-20 NOTE — ED Notes (Signed)
 Dr Viviann notified of trop 132. Orders to be placed as needed.

## 2024-09-20 NOTE — ED Notes (Signed)
 Called CCMD and added pt to board

## 2024-09-20 NOTE — ED Triage Notes (Signed)
 Pt to ED via POV from [facility]. Pt reports centralized CP and SOB for about a week that has progressively gotten worse. Pt also reports cough. Pt has pacemaker. Pt hx of stomach cancer, COPD and CHF. Pt on infusion treatment. Last infusion 12/19. Next infusion for today but they had to cancel due to symptoms.

## 2024-09-20 NOTE — H&P (Signed)
 "  History and Physical    Brittany Nguyen FMW:[id number] DOB: June 15, 1966 DOA: 09/20/2024  DOS: the patient was seen and examined on 09/20/2024  PCP: Brittany Nguyen FALCON, MD   Patient coming from: Home  I have personally briefly reviewed patient's old medical records in [facility]  Chief Complaint: Chest pain and shortness of breath  HPI: Brittany Nguyen is a pleasant 59 y.o. female with medical history significant for obesity, CHF, s/p AICD, CAD s/p of MI, COPD not on home oxygen , A-fib, gastric carcinoid tumor DM, seizure disorder, restless leg syndrome who came into ED complaining of shortness of breath and chest discomfort.  Patient stated that she had central chest which is pressure-like radiating to the back associated with some shortness of breath.  She has been having those problems for almost a week progressively worsening, associated with some nonproductive cough.  Patient stated that she had pain during the deep breath.  She follows with oncologist for carcinoid tumor.  She had some decreased appetite but no nausea no vomiting.  She denies any fever, chills, palpitations, abdominal pain, diarrhea, leg swelling.  ED Course: Upon arrival to the ED, patient is found to have elevated troponin at 174 and 132, leukocytes at 11.6, chest x-ray was unremarkable, EKG showed sinus tachycardia at 101 bpm, no ST elevation.  CT scan of the chest showed no PE.  Patient was started on heparin  drip and hospitalist service was consulted for evaluation for admission for cough shortness of breath and chest pain with elevated troponin.  Review of Systems:  ROS  All other systems negative except as noted in the HPI.  Past Medical History:  Diagnosis Date   Acute anterolateral wall MI (HCC) 08/2016   Acute respiratory failure (HCC)    AICD (automatic cardioverter/defibrillator) present 03/29/2017   a.) Medtronic Evera MRI XT DR SureScan (SN: [id number])   Anxiety    a.) on BZO PRN  (lorazepam )   Arthritis    Asthma 2013   Breast cancer, right (HCC)    C. 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   a.) Bx 02/22/2018 --> NE tumor cells --> AE1/AE3, chromogranin, synaptophysin (+); Ki-67 proliferation rate <1%; b.) repeat Bx 08/24/2018 --> well differentiard NET (G1)   CHF (congestive heart failure) (HCC)    Chronic, continuous use of opioids    a.) has naloxone  Rx available   COPD (chronic obstructive pulmonary disease) (HCC)    Coronary artery disease    Depression    Diet-controlled type 2 diabetes mellitus (HCC)    Diverticulitis 2010   DVT (deep venous thrombosis) (HCC)    Endometriosis 1990   Generalized epilepsy (HCC)    a.) on lacosamide ; b.) seizure 07-30-22 in setting of hypokalemia (K+ 2.6); c.) last seizure 11/13/2023 during blood draw   GERD (gastroesophageal reflux disease)    GIB (gastrointestinal bleeding) 01/08/2018   H/O syncope    HFrEF (heart failure with reduced ejection fraction) (HCC)    a.)TTE 12/09/16: EF 25-30%, dif HK; b.) TTE 02/08/17: EF 25%, dif HK, mild TR, G1DD; c.) TTE 04/25/17: EF 30-35%, mild LVH; d.) TTE 01/30/18: EF 45%, MAC, LAE, mild MR, G1DD; e.) TTE 11/06/2018: EF 25%, sev glob HK, triv MR/TR/PR; f.) TTE 05/12/19: EF 35%, MAC, AoV sclerosis, G1DD; g.) TTE 11/05/19: EF 20%, sev glob HK, triv MR; h.) TTE 06/17/20: EF 40%, mod glob HK, triv MR; I:) TTE 06/14/22: EF 25-30%, G1DD   History of  cardiac catheterization    a.) LHC 10/21/2014: normal coronaries; b.) R/LHC 02/24/2017: normal coronaries, mRA 12, mPA 28, mPCWP 15, CO 8.0, CI 3.8   History of kidney stones    Hx MRSA infection    Hypertension    Hypokalemia    Hypothyroidism    a.) s/p thyroidectomy   Incomplete left bundle branch block (LBBB)    Iron  deficiency anemia 03/27/2018   Lump or mass in breast 11/27/2012   a.) Bx 11/27/2012 --> apocrine wall cyst   Migraine    Nausea & vomiting    Neuropathy    Non-ischemic  cardiomyopathy (HCC)    a.) TTE 12/09/2016: EF 25-30%, b.) TTE 02/08/2017: EF 25%; c.) Medtronic AICD placed 03/29/2017; d.) TTE 04/25/2017: EF 30-35%; e.) TTE 01/30/2018: EF 45%; f.) TTE 11/06/2018: EF 25%; g.) TTE 05/12/2019: EF 35%; h.) TTE 11/05/2019: EF 20%; I.) TTE 06/17/2020: EF 40%; J.) TTE 06/14/2022: EF 25-30%   Obesity    OSA on CPAP    PAF (paroxysmal atrial fibrillation) (HCC)    Pneumonia    PONV (postoperative nausea and vomiting)    Post-COVID chronic cough    Postcholecystectomy diarrhea    Prolonged Q-T interval on ECG    RAD (reactive airway disease)    Restless leg    a.) on ropinirole    Sepsis (HCC)    Unstable angina (HCC)     Past Surgical History:  Procedure Laterality Date   ABDOMINAL HYSTERECTOMY     age 47   BREAST BIOPSY Right 2014   benign   BREAST BIOPSY Right 01/18/2023   Us  Core Bx, coil clip - path pending   BREAST BIOPSY Right 01/18/2023   US  RT BREAST BX W LOC DEV 1ST LESION IMG BX SPEC US  GUIDE 01/18/2023 [facility]   CARDIAC DEFIBRILLATOR PLACEMENT Left 03/29/2017   Procedure: MEDTRONIC CARDIAC DEFIBRILLATOR PLACEMENT (AICD); Location: [facility]; Surgeon: Lane Beard, MD   CESAREAN SECTION     CHOLECYSTECTOMY     COLECTOMY     COLONOSCOPY WITH PROPOFOL  N/A 02/22/2018   Procedure: COLONOSCOPY WITH PROPOFOL ;  Surgeon: Toledo, Brittany POUR, MD;  Location: [facility];  Service: Gastroenterology;  Laterality: N/A;   ESOPHAGOGASTRODUODENOSCOPY (EGD) WITH PROPOFOL  N/A 02/22/2018   Procedure: ESOPHAGOGASTRODUODENOSCOPY (EGD) WITH PROPOFOL ;  Surgeon: Toledo, Brittany POUR, MD;  Location: [facility];  Service: Gastroenterology;  Laterality: N/A;   IVC FILTER INSERTION N/A 08/23/2022   Procedure: IVC FILTER INSERTION;  Surgeon: Brittany Cordella MATSU, MD;  Location: [facility]VASIVE [facility];  Service: Cardiovascular;  Laterality: N/A;   IVC FILTER REMOVAL N/A 03/07/2023   Procedure: IVC FILTER REMOVAL;  Surgeon: Brittany Cordella MATSU, MD;  Location: [facility]  INVASIVE CV LAB;  Service: Cardiovascular;  Laterality: N/A;   KNEE ARTHROPLASTY Left 08/24/2022   Procedure: COMPUTER ASSISTED TOTAL KNEE ARTHROPLASTY;  Surgeon: Brittany Nguyen SQUIBB, MD;  Location: [facility];  Service: Orthopedics;  Laterality: Left;   KNEE ARTHROPLASTY Right 12/01/2023   Procedure: COMPUTER ASSISTED TOTAL KNEE ARTHROPLASTY;  Surgeon: Brittany Nguyen SQUIBB, MD;  Location: [facility];  Service: Orthopedics;  Laterality: Right;   LEFT HEART CATH AND CORONARY ANGIOGRAPHY Left 10/21/2014   Procedure: LEFT HEART CATH AND CORONARY ANGIOGRAPHY; Location: [facility]; Surgeon: Wolm Rhyme, MD   NASAL SINUS SURGERY  2012   OOPHORECTOMY     RIGHT/LEFT HEART CATH AND CORONARY ANGIOGRAPHY Bilateral 02/24/2017   Procedure: RIGHT/LEFT HEART CATH AND CORONARY ANGIOGRAPHY; Location: [facility]   THYROIDECTOMY       reports that she has never smoked. She  has never used smokeless tobacco. She reports that she does not drink alcohol and does not use drugs.  Allergies[1]  Family History  Problem Relation Age of Onset   Breast cancer Mother    Cancer Mother 56       ovarian   CAD Mother    Cancer Father 44       brain   CAD Father    Cancer Daughter 26       skin   Breast cancer Maternal Aunt    Cancer Maternal Aunt 65       breast   Leukemia Paternal Grandfather    Breast cancer Cousin     Prior to Admission medications  Medication Sig Start Date End Date Taking? Authorizing Provider  albuterol  (PROVENTIL ) (2.5 MG/3ML) 0.083% nebulizer solution Inhale 2.5 mg into the lungs every 6 (six) hours as needed. 07/02/24 07/02/25  [provider]  albuterol  (VENTOLIN  HFA) 108 (90 Base) MCG/ACT inhaler Inhale 2 puffs into the lungs every 6 (six) hours as needed for wheezing or shortness of breath. 02/09/24   [provider]  benzonatate  (TESSALON ) 200 MG capsule Take 1 capsule (200 mg total) by mouth 3 (three) times daily. 09/01/24   Amin, Sumayya, MD  cyanocobalamin  (VITAMIN B12) 1000 MCG/ML  injection Inject 1,000 mcg into the muscle every 30 (thirty) days.    [provider]  escitalopram  (LEXAPRO ) 10 MG tablet Take 10 mg by mouth every morning. 03/07/24   [provider]  feeding supplement, GLUCERNA SHAKE, (GLUCERNA SHAKE) LIQD Take 237 mLs by mouth 3 (three) times daily between meals. 09/01/24   Caleen Qualia, MD  gabapentin  (NEURONTIN ) 300 MG capsule Take 1 capsule (300 mg total) by mouth 3 (three) times daily. 10/08/23   Ezenduka, Nkeiruka J, MD  guaiFENesin  (MUCINEX ) 600 MG 12 hr tablet Take 1 tablet (600 mg total) by mouth 2 (two) times daily. 09/01/24   Caleen Qualia, MD  Lacosamide  100 MG TABS Take 200 mg by mouth 2 (two) times daily.    [provider]  levothyroxine  (SYNTHROID ) 200 MCG tablet Take 200 mcg by mouth daily before breakfast.    [provider]  levothyroxine  (SYNTHROID ) 25 MCG tablet Take 25 mcg by mouth every morning. 07/05/24   [provider]  LORazepam  (ATIVAN ) 1 MG tablet Take 1 tablet (1 mg total) by mouth 4 (four) times daily as needed. Patient taking differently: Take 1 mg by mouth 4 (four) times daily. 11/10/21   Amin, Sumayya, MD  losartan  (COZAAR ) 25 MG tablet Take 12.5 mg by mouth 2 (two) times daily. 12/08/23   [provider]  methadone  (DOLOPHINE ) 10 MG tablet Take 10-20 mg by mouth 3 (three) times daily. 10 MG BID and 20 mg HS    [provider]  metoprolol  succinate (TOPROL -XL) 50 MG 24 hr tablet Take 50 mg by mouth 2 (two) times daily. 12/08/23   [provider]  Multiple Vitamin (MULTIVITAMIN WITH MINERALS) TABS tablet Take 1 tablet by mouth daily. 09/01/24   Caleen Qualia, MD  Potassium Chloride  ER 20 MEQ TBCR Take 20 mEq by mouth 2 (two) times daily. When taking torsemide  12/08/23   [provider]  promethazine  (PHENERGAN ) 25 MG tablet Take 25 mg by mouth 3 (three) times daily.    [provider]  rOPINIRole  (REQUIP ) 4 MG tablet Take 4 mg by mouth at bedtime.  01/30/19   [provider]  spironolactone  (ALDACTONE ) 50 MG tablet Take 50 mg by mouth 2 (  two) times daily.    [provider]  torsemide  (DEMADEX ) 20 MG tablet Take 40 mg by mouth 2 (two) times daily.    [provider]  traZODone  (DESYREL ) 50 MG tablet Take 50 mg by mouth at bedtime. 03/01/24   [provider]  Vitamin D , Ergocalciferol , (DRISDOL) 1.25 MG (50000 UNIT) CAPS capsule Take 50,000 Units by mouth once a week. 02/09/24   [provider]    Physical Exam: Vitals:   09/20/24 0917 09/20/24 1055  BP: 113/86   Pulse: 99   Resp: (!) 22   Temp: 98.1 F (36.7 C)   TempSrc: Oral   SpO2: 95% 96%    Physical Exam   Constitutional: Alert, awake, calm, comfortable HEENT: Neck supple Respiratory: Clear to auscultation B/L, no wheezing, no rales.  Cardiovascular: Regular rate and rhythm, no murmurs / rubs / gallops. No extremity edema. 2+ pedal pulses. No carotid bruits.  Abdomen: Soft, no tenderness, Bowel sounds positive.  Obese abdomen Musculoskeletal: no clubbing / cyanosis. Good ROM, no contractures. Normal muscle tone.  Skin: no rashes, lesions, ulcers. Neurologic: CN 2-12 grossly intact. Sensation intact, No focal deficit identified Psychiatric: Alert and oriented x 3. Normal mood.    Labs on Admission: I have personally reviewed following labs and imaging studies  CBC: Recent Labs  Lab 09/20/24 0920  WBC 11.6*  HGB 10.4*  HCT 32.9*  MCV 79.1*  PLT 266   Basic Metabolic Panel: Recent Labs  Lab 09/20/24 0920  NA 140  K 3.1*  CL 95*  CO2 34*  GLUCOSE 166*  BUN 11  CREATININE 0.97  CALCIUM  9.3  MG 1.9   GFR: CrCl cannot be calculated (Unknown ideal weight.). Liver Function Tests: No results for input(s): AST, ALT, ALKPHOS, BILITOT, PROT, ALBUMIN  in the last 168 hours. No results for input(s): LIPASE, AMYLASE in the last 168 hours. No results for input(s): AMMONIA in the last 168  hours. Coagulation Profile: Recent Labs  Lab 09/20/24 1118  INR 1.0   Cardiac Enzymes: No results for input(s): CKTOTAL, CKMB, CKMBINDEX, TROPONINI, TROPONINIHS in the last 168 hours. BNP (last 3 results) Recent Labs    03/03/24 1604 03/14/24 0906 03/28/24 0849  BNP 34.2 34.2 18.2   HbA1C: No results for input(s): HGBA1C in the last 72 hours. CBG: No results for input(s): GLUCAP in the last 168 hours. Lipid Profile: No results for input(s): CHOL, HDL, LDLCALC, TRIG, CHOLHDL, LDLDIRECT in the last 72 hours. Thyroid  Function Tests: No results for input(s): TSH, T4TOTAL, FREET4, T3FREE, THYROIDAB in the last 72 hours. Anemia Panel: No results for input(s): VITAMINB12, FOLATE, FERRITIN, TIBC, IRON , RETICCTPCT in the last 72 hours. Urine analysis:    Component Value Date/Time   COLORURINE YELLOW (A) 08/02/2024 1430   APPEARANCEUR HAZY (A) 08/02/2024 1430   APPEARANCEUR Clear 09/02/2014 2312   LABSPEC 1.028 08/02/2024 1430   LABSPEC 1.013 09/02/2014 2312   PHURINE 5.0 08/02/2024 1430   GLUCOSEU NEGATIVE 08/02/2024 1430   GLUCOSEU Negative 09/02/2014 2312   HGBUR NEGATIVE 08/02/2024 1430   BILIRUBINUR NEGATIVE 08/02/2024 1430   BILIRUBINUR Negative 09/02/2014 2312   KETONESUR NEGATIVE 08/02/2024 1430   PROTEINUR NEGATIVE 08/02/2024 1430   NITRITE NEGATIVE 08/02/2024 1430   LEUKOCYTESUR TRACE (A) 08/02/2024 1430   LEUKOCYTESUR Negative 09/02/2014 2312    Radiological Exams on Admission: I have personally reviewed images NM Pulmonary Perfusion Result Date: 09/20/2024 EXAM: NM Lung Perfusion Scan. CLINICAL HISTORY: Pulmonary embolism (PE) suspected, high prob. SOB, chest pain, coughing x  1 wk. Pt has a Pacemaker. Pt has COPD. History of DVT left leg 8-10 yrs ago. TECHNIQUE: 4.33 mCi MAA was administered intravenously via the right AC at 12:23 pm. Planar images of the lungs were obtained in multiple projections.  RADIOPHARMACEUTICAL: 4.33 mCi MAA COMPARISON: X ray 1 16 26  FINDINGS: PERFUSION: No wedge-shaped peripheral perfusion defect within left or right lung to suggest acute pulmonary embolism. Normal perfusion pattern. IMPRESSION: 1. No perfusion defects to indicate pulmonary emboli. Electronically signed by: Norleen Boxer MD 09/20/2024 01:19 PM EST RP Workstation: [id number]   DG Chest 2 View Result Date: 09/20/2024 CLINICAL DATA:  Chest pain. EXAM: CHEST - 2 VIEW COMPARISON:  08/31/2024 FINDINGS: Stable of appearance of the left chest ICD. Both lungs are clear. Heart size is stable and within normal limits. Trachea is midline. No large pleural effusions. Bridging osteophytes in the lower thoracic spine. IVC filter is partially imaged. IMPRESSION: No active cardiopulmonary disease. Electronically Signed   By: Juliene Balder M.D.   On: 09/20/2024 09:48    EKG: My personal interpretation of EKG shows: Normal sinus rhythm no ST elevation    Assessment/Plan Principal Problem:   Chest pain Active Problems:   S/P implantation of automatic cardioverter/defibrillator (AICD)   Seizure disorder (HCC)   OSA on CPAP   HTN (hypertension)   RLS (restless legs syndrome)   Chronic HFrEF (heart failure with reduced ejection fraction) (HCC)   COPD (chronic obstructive pulmonary disease) (HCC)    Assessment and Plan:  59 year old female with multiple medical problems including but not limited to CAD s/p MI, CHF s/p AICD, OSA on CPAP, HTN, COPD not on home oxygen , RLS, depression, seizure disorder, obesity, gastric carcinoid tumor who came into ED complaining of cough shortness of breath and chest pain with elevated troponin.  1.  Chest pain/cough/shortness of breath with elevated troponin - She will be placed in observation. - She was started on heparin  drip in the emergency room. - CT chest showed no PE - Will continue on heparin  drip, telemetry, per chest pain protocol - Will get echocardiogram,  cardiology evaluation.  She normally goes to [facility] for cardiology.  2.  History of congestive heart failure, s/p AICD - Does not appear to be volume overloaded. - Patient takes torsemide  and spironolactone  at home. - Will continue those medications. - Will place her on input output charting and daily weight. - Cardiology consult called.  3.  OSA on CPAP - Resume CPAP during the night  4.  COPD not on home oxygen  - Continue nebulization  5.  Carcinoid tumor - Follow-up as outpatient  6.  HTN - Resume her home medications for hypertension including metoprolol  and losartan  - Continue to monitor blood pressure  7.  History of seizure disorder - Continue lacosamide   8.  RLS - Continue ropinirole   9.  Chronic hypokalemia - Continue potassium supplementation at home dose 20 mEq twice a day  10.  Hypothyroidism - Continue home dose of levothyroxine  at 200 mcg daily    DVT prophylaxis: IV heparin  gtts Code Status: Full Code Family Communication: None Disposition Plan: Home Consults called: Cardiology Admission status: Observation, Telemetry bed   Nena Rebel, MD [facility] 09/20/2024, 3:42 PM        [1]  Allergies Allergen Reactions   Contrast Media [Iodinated Contrast Media] Shortness Of Breath    Per CT report in 2014 pt had break through contrast reaction with hives and SOB following 13 hour prep. MSY  Ferumoxytol  Nausea Only   Iron  Sucrose Anaphylaxis   Lidocaine  Hives   Metrizamide Shortness Of Breath   Penicillins Hives and Other (See Comments)    Tolerated ceftriaxone  in past IgE = 4 (WNL) on 08/12/2022  ediate rash, facial/tongue/throat swelling, SOB or lightheadedness with hypotension, tachy   Sacubitril-Valsartan     Sz (unclear if new start entresto vs hypoglycemia)   Latex Hives    IgE < 0.10 (WNL) on 08/21/2023   Tizanidine  Other (See Comments)    Feels altered   Zofran  [Ondansetron ] Other (See Comments)    Migraine per pt    Povidone-Iodine  Rash    Blistering rash   Pulmicort  [Budesonide ] Itching   "

## 2024-09-20 NOTE — Consult Note (Signed)
 " CARDIOLOGY CONSULT NOTE               Patient ID: Brittany Nguyen MRN: [id number] DOB/AGE: Apr 05, 1966 59 y.o.  Admit date: 09/20/2024 Referring Physician Dr Nena Rebel hospitalist Primary Physician Dr. Lynwood Null primary Primary Cardiologist [facility] Reason for Consultation chest pain shortness of breath  HPI: 58 year old obese female history of HFrEF congestive heart failure AICD in place COPD nonischemic cardiomyopathy atrial fibrillation gastric carcinoid tumor diabetes seizure disorder restless leg syndrome presented with shortness of breath and chest discomfort radiating to her back she has had symptoms normal for about a week with a nonproductive cough pain was worse with deep breath she has also had decreased appetite no nausea vomiting no palpitation tachycardia fever chills or sweats.  Evaluation emergency room had negative CT chest for PE EKG was on remarkable nondiagnostic troponin slightly increased so the patient was placed on anticoagulation to help with the symptoms of shortness of breath cough  Review of systems complete and found to be negative unless listed above     Past Medical History:  Diagnosis Date   Acute anterolateral wall MI (HCC) 08/2016   Acute respiratory failure (HCC)    AICD (automatic cardioverter/defibrillator) present 03/29/2017   a.) Medtronic Evera MRI XT DR SureScan (SN: [id number])   Anxiety    a.) on BZO PRN (lorazepam )   Arthritis    Asthma 2013   Breast cancer, right (HCC)    C. 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   a.) Bx 02/22/2018 --> NE tumor cells --> AE1/AE3, chromogranin, synaptophysin (+); Ki-67 proliferation rate <1%; b.) repeat Bx 08/24/2018 --> well differentiard NET (G1)   CHF (congestive heart failure) (HCC)    Chronic, continuous use of opioids    a.) has naloxone  Rx available   COPD (chronic obstructive pulmonary disease) (HCC)    Coronary artery  disease    Depression    Diet-controlled type 2 diabetes mellitus (HCC)    Diverticulitis 2010   DVT (deep venous thrombosis) (HCC)    Endometriosis 1990   Generalized epilepsy (HCC)    a.) on lacosamide ; b.) seizure 07-30-22 in setting of hypokalemia (K+ 2.6); c.) last seizure 11/13/2023 during blood draw   GERD (gastroesophageal reflux disease)    GIB (gastrointestinal bleeding) 01/08/2018   H/O syncope    HFrEF (heart failure with reduced ejection fraction) (HCC)    a.)TTE 12/09/16: EF 25-30%, dif HK; b.) TTE 02/08/17: EF 25%, dif HK, mild TR, G1DD; c.) TTE 04/25/17: EF 30-35%, mild LVH; d.) TTE 01/30/18: EF 45%, MAC, LAE, mild MR, G1DD; e.) TTE 11/06/2018: EF 25%, sev glob HK, triv MR/TR/PR; f.) TTE 05/12/19: EF 35%, MAC, AoV sclerosis, G1DD; g.) TTE 11/05/19: EF 20%, sev glob HK, triv MR; h.) TTE 06/17/20: EF 40%, mod glob HK, triv MR; I:) TTE 06/14/22: EF 25-30%, G1DD   History of cardiac catheterization    a.) LHC 10/21/2014: normal coronaries; b.) R/LHC 02/24/2017: normal coronaries, mRA 12, mPA 28, mPCWP 15, CO 8.0, CI 3.8   History of kidney stones    Hx MRSA infection    Hypertension    Hypokalemia    Hypothyroidism    a.) s/p thyroidectomy   Incomplete left bundle branch block (LBBB)    Iron  deficiency anemia 03/27/2018   Lump or mass in breast 11/27/2012   a.) Bx 11/27/2012 --> apocrine wall cyst   Migraine    Nausea & vomiting  Neuropathy    Non-ischemic cardiomyopathy (HCC)    a.) TTE 12/09/2016: EF 25-30%, b.) TTE 02/08/2017: EF 25%; c.) Medtronic AICD placed 03/29/2017; d.) TTE 04/25/2017: EF 30-35%; e.) TTE 01/30/2018: EF 45%; f.) TTE 11/06/2018: EF 25%; g.) TTE 05/12/2019: EF 35%; h.) TTE 11/05/2019: EF 20%; I.) TTE 06/17/2020: EF 40%; J.) TTE 06/14/2022: EF 25-30%   Obesity    OSA on CPAP    PAF (paroxysmal atrial fibrillation) (HCC)    Pneumonia    PONV (postoperative nausea and vomiting)    Post-COVID chronic cough    Postcholecystectomy diarrhea    Prolonged Q-T  interval on ECG    RAD (reactive airway disease)    Restless leg    a.) on ropinirole    Sepsis (HCC)    Unstable angina (HCC)     Past Surgical History:  Procedure Laterality Date   ABDOMINAL HYSTERECTOMY     age 27   BREAST BIOPSY Right 2014   benign   BREAST BIOPSY Right 01/18/2023   Us  Core Bx, coil clip - path pending   BREAST BIOPSY Right 01/18/2023   US  RT BREAST BX W LOC DEV 1ST LESION IMG BX SPEC US  GUIDE 01/18/2023 [facility]   CARDIAC DEFIBRILLATOR PLACEMENT Left 03/29/2017   Procedure: MEDTRONIC CARDIAC DEFIBRILLATOR PLACEMENT (AICD); Location: [facility]; Surgeon: Lane Beard, MD   CESAREAN SECTION     CHOLECYSTECTOMY     COLECTOMY     COLONOSCOPY WITH PROPOFOL  N/A 02/22/2018   Procedure: COLONOSCOPY WITH PROPOFOL ;  Surgeon: Toledo, Ladell POUR, MD;  Location: [facility];  Service: Gastroenterology;  Laterality: N/A;   ESOPHAGOGASTRODUODENOSCOPY (EGD) WITH PROPOFOL  N/A 02/22/2018   Procedure: ESOPHAGOGASTRODUODENOSCOPY (EGD) WITH PROPOFOL ;  Surgeon: Toledo, Ladell POUR, MD;  Location: [facility];  Service: Gastroenterology;  Laterality: N/A;   IVC FILTER INSERTION N/A 08/23/2022   Procedure: IVC FILTER INSERTION;  Surgeon: Jama Cordella MATSU, MD;  Location: [facility];  Service: Cardiovascular;  Laterality: N/A;   IVC FILTER REMOVAL N/A 03/07/2023   Procedure: IVC FILTER REMOVAL;  Surgeon: Jama Cordella MATSU, MD;  Location: [facility];  Service: Cardiovascular;  Laterality: N/A;   KNEE ARTHROPLASTY Left 08/24/2022   Procedure: COMPUTER ASSISTED TOTAL KNEE ARTHROPLASTY;  Surgeon: Mardee Lynwood SQUIBB, MD;  Location: [facility];  Service: Orthopedics;  Laterality: Left;   KNEE ARTHROPLASTY Right 12/01/2023   Procedure: COMPUTER ASSISTED TOTAL KNEE ARTHROPLASTY;  Surgeon: Mardee Lynwood SQUIBB, MD;  Location: [facility];  Service: Orthopedics;  Laterality: Right;   LEFT HEART CATH AND CORONARY ANGIOGRAPHY Left 10/21/2014   Procedure: LEFT HEART CATH AND CORONARY  ANGIOGRAPHY; Location: [facility]; Surgeon: Wolm Rhyme, MD   NASAL SINUS SURGERY  2012   OOPHORECTOMY     RIGHT/LEFT HEART CATH AND CORONARY ANGIOGRAPHY Bilateral 02/24/2017   Procedure: RIGHT/LEFT HEART CATH AND CORONARY ANGIOGRAPHY; Location: [facility]   THYROIDECTOMY      (Not in a hospital admission)  Social History   Socioeconomic History   Marital status: Married    Spouse name: Hedwig, Mcfall (Spouse) [phone] (Home Phone)   Number of children: Not on file   Years of education: Not on file   Highest education level: Not on file  Occupational History   Occupation: works at [employer].  Tobacco Use   Smoking status: Never   Smokeless tobacco: Never  Vaping Use   Vaping status: Never Used  Substance and Sexual Activity   Alcohol use: No   Drug use: Never   Sexual activity: Yes    Birth  control/protection: Post-menopausal  Other Topics Concern   Not on file  Social History Narrative   Not on file   Social Drivers of Health   Tobacco Use: Low Risk (09/20/2024)   Patient History    Smoking Tobacco Use: Never    Smokeless Tobacco Use: Never    Passive Exposure: Not on file  Financial Resource Strain: High Risk (11/24/2023)   Received from [facility]   Overall Financial Resource Strain (CARDIA)    Difficulty of Paying Living Expenses: Hard  Food Insecurity: No Food Insecurity (08/31/2024)   Epic    Worried About Running Out of Food in the Last Year: Never true    Ran Out of Food in the Last Year: Never true  Transportation Needs: No Transportation Needs (08/31/2024)   Epic    Lack of Transportation (Medical): No    Lack of Transportation (Non-Medical): No  Physical Activity: Not on file  Stress: Not on file  Social Connections: Moderately Integrated (08/31/2024)   Social Connection and Isolation Panel    Frequency of Communication with Friends and Family: Twice a week    Frequency of Social Gatherings with Friends and Family: Twice a week     Attends Religious Services: 1 to 4 times per year    Active Member of [employer] or Organizations: No    Attends Banker Meetings: Never    Marital Status: Married  Catering Manager Violence: Not At Risk (08/31/2024)   Epic    Fear of Current or Ex-Partner: No    Emotionally Abused: No    Physically Abused: No    Sexually Abused: No  Depression (PHQ2-9): Not on file  Alcohol Screen: Not on file  Housing: Unknown (08/31/2024)   Epic    Unable to Pay for Housing in the Last Year: No    Number of Times Moved in the Last Year: Not on file    Homeless in the Last Year: No  Utilities: Not At Risk (08/31/2024)   Epic    Threatened with loss of utilities: No  Health Literacy: Not on file    Family History  Problem Relation Age of Onset   Breast cancer Mother    Cancer Mother 19       ovarian   CAD Mother    Cancer Father 57       brain   CAD Father    Cancer Daughter 96       skin   Breast cancer Maternal Aunt    Cancer Maternal Aunt 62       breast   Leukemia Paternal Grandfather    Breast cancer Cousin       Review of systems complete and found to be negative unless listed above      PHYSICAL EXAM  General: Well developed, well nourished, in no acute distress HEENT:  Normocephalic and atramatic Neck:  No JVD.  Lungs: Clear bilaterally to auscultation and percussion. Heart: HRRR . Normal S1 and S2 without gallops or murmurs.  Abdomen: Bowel sounds are positive, abdomen soft and non-tender  Msk:  Back normal, normal gait. Normal strength and tone for age. Extremities: No clubbing, cyanosis or edema.   Neuro: Alert and oriented X 3. Psych:  Good affect, responds appropriately  Labs:   Lab Results  Component Value Date   WBC 11.6 (H) 09/20/2024   HGB 10.4 (L) 09/20/2024   HCT 32.9 (L) 09/20/2024   MCV 79.1 (L) 09/20/2024   PLT 266 09/20/2024  Recent Labs  Lab 09/20/24 0920  NA 140  K 3.1*  CL 95*  CO2 34*  BUN 11  CREATININE 0.97  CALCIUM   9.3  GLUCOSE 166*   Lab Results  Component Value Date   CKTOTAL 91 01/13/2020   CKMB < 0.5 (L) 09/03/2014   TROPONINI <0.03 01/16/2019    Lab Results  Component Value Date   CHOL 257 (H) 12/28/2023   CHOL 206 (H) 04/25/2017   CHOL 153 09/03/2014   Lab Results  Component Value Date   HDL 50 12/28/2023   HDL 45 04/25/2017   HDL 32 (L) 09/03/2014   Lab Results  Component Value Date   LDLCALC 164 (H) 12/28/2023   LDLCALC 142 (H) 04/25/2017   LDLCALC 74 09/03/2014   Lab Results  Component Value Date   TRIG 217 (H) 12/28/2023   TRIG 94 04/25/2017   TRIG 237 (H) 09/03/2014   Lab Results  Component Value Date   CHOLHDL 5.1 12/28/2023   CHOLHDL 4.6 04/25/2017   No results found for: LDLDIRECT    Radiology: NM Pulmonary Perfusion Result Date: 09/20/2024 EXAM: NM Lung Perfusion Scan. CLINICAL HISTORY: Pulmonary embolism (PE) suspected, high prob. SOB, chest pain, coughing x 1 wk. Pt has a Pacemaker. Pt has COPD. History of DVT left leg 8-10 yrs ago. TECHNIQUE: 4.33 mCi MAA was administered intravenously via the right AC at 12:23 pm. Planar images of the lungs were obtained in multiple projections. RADIOPHARMACEUTICAL: 4.33 mCi MAA COMPARISON: X ray 1 16 26  FINDINGS: PERFUSION: No wedge-shaped peripheral perfusion defect within left or right lung to suggest acute pulmonary embolism. Normal perfusion pattern. IMPRESSION: 1. No perfusion defects to indicate pulmonary emboli. Electronically signed by: Norleen Boxer MD 09/20/2024 01:19 PM EST RP Workstation: [id number]   DG Chest 2 View Result Date: 09/20/2024 CLINICAL DATA:  Chest pain. EXAM: CHEST - 2 VIEW COMPARISON:  08/31/2024 FINDINGS: Stable of appearance of the left chest ICD. Both lungs are clear. Heart size is stable and within normal limits. Trachea is midline. No large pleural effusions. Bridging osteophytes in the lower thoracic spine. IVC filter is partially imaged. IMPRESSION: No active cardiopulmonary disease.  Electronically Signed   By: Juliene Balder M.D.   On: 09/20/2024 09:48   DG Chest Port 1 View Result Date: 08/31/2024 CLINICAL DATA:  Shortness of breath. EXAM: PORTABLE CHEST 1 VIEW COMPARISON:  08/02/2024 FINDINGS: The cardio pericardial silhouette is enlarged. The lungs are clear without focal pneumonia, edema, pneumothorax or pleural effusion. Left-sided permanent pacemaker/AICD again noted. Telemetry leads overlie the chest. IMPRESSION: Enlargement of the cardiopericardial silhouette. No acute cardiopulmonary findings. Electronically Signed   By: Camellia Candle M.D.   On: 08/31/2024 08:37    EKG: Sinus tach rate around 100 nonspecific ST-T wave changes low voltage  ASSESSMENT AND PLAN:  Chest pain Nonischemic cardiomyopathy HFrEF AICD in place COPD History of atrial fibrillation Gastric carcinoid tumor Diabetes Seizure disorder Obesity Borderline elevated troponin . Plan Agreed admit follow-up troponins EKGs telemetry Probably myocardial injury without myocardial infarction demand ischemia Consider short-term anticoagulation with heparin  for 48 to 72 hours Follow-up additional troponins right now numbers are relatively flat Echocardiogram to be helpful for reassessment of left ventricular function We do not necessarily recommend invasive strategy Consider cardiac CTA or Myoview Continue GDMT for HFrEF Recommend modest weight loss exercise portion control Consider evaluation for obstructive sleep apnea with a sleep study CPAP if indicated  Signed: Cara JONETTA Lovelace MD, 09/20/2024, 7:42 PM      "

## 2024-09-21 ENCOUNTER — Ambulatory Visit: Payer: Self-pay

## 2024-09-21 ENCOUNTER — Observation Stay: Admit: 2024-09-21 | Discharge: 2024-09-21 | Disposition: A | Attending: Hospitalist

## 2024-09-21 DIAGNOSIS — R079 Chest pain, unspecified: Principal | ICD-10-CM

## 2024-09-21 LAB — CBC
HCT: 32.4 % — ABNORMAL LOW (ref 36.0–46.0)
Hemoglobin: 9.8 g/dL — ABNORMAL LOW (ref 12.0–15.0)
MCH: 24.5 pg — ABNORMAL LOW (ref 26.0–34.0)
MCHC: 30.2 g/dL (ref 30.0–36.0)
MCV: 81 fL (ref 80.0–100.0)
Platelets: 234 K/uL (ref 150–400)
RBC: 4 MIL/uL (ref 3.87–5.11)
RDW: 19 % — ABNORMAL HIGH (ref 11.5–15.5)
WBC: 7 K/uL (ref 4.0–10.5)
nRBC: 0 % (ref 0.0–0.2)

## 2024-09-21 LAB — BASIC METABOLIC PANEL WITH GFR
Anion gap: 10 (ref 5–15)
BUN: 13 mg/dL (ref 6–20)
CO2: 32 mmol/L (ref 22–32)
Calcium: 9.1 mg/dL (ref 8.9–10.3)
Chloride: 98 mmol/L (ref 98–111)
Creatinine, Ser: 0.98 mg/dL (ref 0.44–1.00)
GFR, Estimated: >60 mL/min
Glucose, Bld: 161 mg/dL — ABNORMAL HIGH (ref 70–99)
Potassium: 3.4 mmol/L — ABNORMAL LOW (ref 3.5–5.1)
Sodium: 140 mmol/L (ref 135–145)

## 2024-09-21 LAB — ECHOCARDIOGRAM COMPLETE
AR max vel: 1.69 cm2
AV Area VTI: 1.43 cm2
AV Area mean vel: 1.55 cm2
AV Mean grad: 4 mmHg
AV Peak grad: 7.7 mmHg
Ao pk vel: 1.39 m/s
Calc EF: 48.4 %
S' Lateral: 4 cm
Single Plane A2C EF: 52.9 %
Single Plane A4C EF: 41 %

## 2024-09-21 LAB — HEPARIN LEVEL (UNFRACTIONATED): Heparin Unfractionated: 0.37 [IU]/mL (ref 0.30–0.70)

## 2024-09-21 MED ORDER — METOCLOPRAMIDE HCL 5 MG/ML IJ SOLN
10.0000 mg | Freq: Four times a day (QID) | INTRAMUSCULAR | Status: DC | PRN
  Administered 2024-09-21: 10 mg via INTRAVENOUS
  Filled 2024-09-21: qty 2

## 2024-09-21 NOTE — Care Management Obs Status (Signed)
 MEDICARE OBSERVATION STATUS NOTIFICATION   Patient Details  Name: Brittany Nguyen MRN: [id number] Date of Birth: [birth year]   Medicare Observation Status Notification Given:  Yes    Delphine KANDICE Bring, RN 09/21/2024, 11:49 AM

## 2024-09-21 NOTE — Progress Notes (Signed)
 PHARMACY - ANTICOAGULATION CONSULT NOTE  Pharmacy Consult for heparin  infusion Indication: chest pain/ACS  Allergies[1]  Patient Measurements: weight 90.7 kg 09/01/24    Vital Signs: Temp: 97.8 F (36.6 C) (01/16 2139) Temp Source: Oral (01/16 2139) BP: 101/61 (01/16 2139) Pulse Rate: 135 (01/16 2139)  Labs: Recent Labs    09/20/24 0042 09/20/24 0920 09/20/24 1118  HGB  --  10.4*  --   HCT  --  32.9*  --   PLT  --  266  --   APTT  --   --  27  LABPROT  --   --  13.4  INR  --   --  1.0  HEPARINUNFRC 0.36  --   --   CREATININE  --  0.97  --     CrCl cannot be calculated (Unknown ideal weight.).   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   a.) Bx 02/22/2018 --> NE tumor cells --> AE1/AE3, chromogranin, synaptophysin (+); Ki-67 proliferation rate <1%; b.) repeat Bx 08/24/2018 --> well differentiard NET (G1)   CHF (congestive heart failure) (HCC)    Chronic, continuous use of opioids    a.) has naloxone  Rx available   COPD (chronic obstructive pulmonary disease) (HCC)    Coronary artery disease    Depression    Diet-controlled type 2 diabetes mellitus (HCC)    Diverticulitis 2010   DVT (deep venous thrombosis) (HCC)    Endometriosis 1990   Generalized epilepsy (HCC)    a.) on lacosamide ; b.) seizure 07-30-22 in setting of hypokalemia (K+ 2.6); c.) last seizure 11/13/2023 during blood draw   GERD (gastroesophageal reflux disease)    GIB (gastrointestinal bleeding) 01/08/2018   H/O syncope    HFrEF (heart failure with reduced ejection fraction) (HCC)     a.)TTE 12/09/16: EF 25-30%, dif HK; b.) TTE 02/08/17: EF 25%, dif HK, mild TR, G1DD; c.) TTE 04/25/17: EF 30-35%, mild LVH; d.) TTE 01/30/18: EF 45%, MAC, LAE, mild MR, G1DD; e.) TTE 11/06/2018: EF 25%, sev glob HK, triv MR/TR/PR; f.) TTE 05/12/19: EF 35%, MAC, AoV sclerosis, G1DD; g.) TTE 11/05/19: EF 20%, sev glob HK, triv MR; h.) TTE 06/17/20: EF 40%, mod glob HK, triv MR; I:) TTE 06/14/22: EF 25-30%, G1DD   History of cardiac catheterization    a.) LHC 10/21/2014: normal coronaries; b.) R/LHC 02/24/2017: normal coronaries, mRA 12, mPA 28, mPCWP 15, CO 8.0, CI 3.8   History of kidney stones    Hx MRSA infection    Hypertension    Hypokalemia    Hypothyroidism    a.) s/p thyroidectomy   Incomplete left bundle branch block (LBBB)    Iron  deficiency anemia 03/27/2018   Lump or mass in breast 11/27/2012   a.) Bx 11/27/2012 --> apocrine wall cyst   Migraine    Nausea & vomiting    Neuropathy    Non-ischemic cardiomyopathy (HCC)    a.) TTE 12/09/2016: EF 25-30%, b.) TTE 02/08/2017: EF 25%; c.) Medtronic AICD placed 03/29/2017; d.) TTE 04/25/2017: EF 30-35%; e.) TTE 01/30/2018: EF 45%; f.)  TTE 11/06/2018: EF 25%; g.) TTE 05/12/2019: EF 35%; h.) TTE 11/05/2019: EF 20%; I.) TTE 06/17/2020: EF 40%; J.) TTE 06/14/2022: EF 25-30%   Obesity    OSA on CPAP    PAF (paroxysmal atrial fibrillation) (HCC)    Pneumonia    PONV (postoperative nausea and vomiting)    Post-COVID chronic cough    Postcholecystectomy diarrhea    Prolonged Q-T interval on ECG    RAD (reactive airway disease)    Restless leg    a.) on ropinirole    Sepsis (HCC)    Unstable angina (HCC)     Medications:  Scheduled:   gabapentin   300 mg Oral TID   levothyroxine   200 mcg Oral QAC breakfast   losartan   12.5 mg Oral BID   metoprolol  succinate  50 mg Oral BID   potassium chloride  SA  20 mEq Oral BID   rOPINIRole   4 mg Oral QHS   spironolactone   50 mg Oral BID   Infusions:   heparin  1,200 Units/hr (09/20/24 1422)   PRN:  acetaminophen  **OR** acetaminophen , guaiFENesin , morphine  injection, nitroGLYCERIN , oxyCODONE , polyethylene glycol  Assessment: 59 yo female w/ PMH of anxiety, CHF, COPD, depression, HTN, hypothyroidism, RLS, seizure d/o presenting to ED reporting of chest pain. A review of medical records reveals no chronic anticoagulation prior to admission  Baseline Labs: Hgb 10.4, PLT 266, INR & aPTT pending  1/16 2242: HL 0.36, therapeutic x 1   Goal of Therapy:  anti-Xa level  0.3-0.7 units/ml Monitor platelets by anticoagulation protocol: Yes   Plan:  --Heparin  level therapeutic x 1 --Continue heparin  infusion at 1200 units/hr  --Check confirmatory anti-Xa level in 6 hours  --Continue to monitor H&H and platelets  Lum VEAR Mania, PharmD, BCPS 09/21/2024,12:35 AM       [1]  Allergies Allergen Reactions   Contrast Media [Iodinated Contrast Media] Shortness Of Breath    Per CT report in 2014 pt had break through contrast reaction with hives and SOB following 13 hour prep. MSY    Ferumoxytol  Nausea Only   Iron  Sucrose Anaphylaxis   Lidocaine  Hives   Metrizamide Shortness Of Breath   Penicillins Hives and Other (See Comments)    Tolerated ceftriaxone  in past IgE = 4 (WNL) on 08/12/2022  ediate rash, facial/tongue/throat swelling, SOB or lightheadedness with hypotension, tachy   Sacubitril-Valsartan     Sz (unclear if new start entresto vs hypoglycemia)   Latex Hives    IgE < 0.10 (WNL) on 08/21/2023   Tizanidine  Other (See Comments)    Feels altered   Zofran  [Ondansetron ] Other (See Comments)    Migraine per pt   Povidone-Iodine  Rash    Blistering rash   Pulmicort  [Budesonide ] Itching

## 2024-09-21 NOTE — Progress Notes (Signed)
 [facility] Cardiology    SUBJECTIVE: Patient states he feels somewhat better chest pain has improved shortness of breath improved no fever chills or sweats no nausea vomiting   Vitals:   09/21/24 0219 09/21/24 0608 09/21/24 0659 09/21/24 0900  BP:  111/72  121/72  Pulse:  85  99  Resp:  11  12  Temp: 98.3 F (36.8 C)  97.8 F (36.6 C)   TempSrc: Oral  Oral   SpO2:  96%  (!) 81%    No intake or output data in the 24 hours ending 09/21/24 1038    PHYSICAL EXAM  General: Well developed, well nourished, in no acute distress HEENT:  Normocephalic and atramatic Neck:  No JVD.  Lungs: Clear bilaterally to auscultation and percussion. Heart: HRRR . Normal S1 and S2 without gallops or murmurs.  Abdomen: Bowel sounds are positive, abdomen soft and non-tender  Msk:  Back normal, normal gait. Normal strength and tone for age. Extremities: No clubbing, cyanosis or edema.   Neuro: Alert and oriented X 3. Psych:  Good affect, responds appropriately   LABS: Basic Metabolic Panel: Recent Labs    09/20/24 0920 09/21/24 0702  NA 140 140  K 3.1* 3.4*  CL 95* 98  CO2 34* 32  GLUCOSE 166* 161*  BUN 11 13  CREATININE 0.97 0.98  CALCIUM  9.3 9.1  MG 1.9  --    Liver Function Tests: No results for input(s): AST, ALT, ALKPHOS, BILITOT, PROT, ALBUMIN  in the last 72 hours. No results for input(s): LIPASE, AMYLASE in the last 72 hours. CBC: Recent Labs    09/20/24 0920 09/21/24 0702  WBC 11.6* 7.0  HGB 10.4* 9.8*  HCT 32.9* 32.4*  MCV 79.1* 81.0  PLT 266 234   Cardiac Enzymes: No results for input(s): CKTOTAL, CKMB, CKMBINDEX, TROPONINI in the last 72 hours. BNP: Invalid input(s): POCBNP D-Dimer: No results for input(s): DDIMER in the last 72 hours. Hemoglobin A1C: No results for input(s): HGBA1C in the last 72 hours. Fasting Lipid Panel: No results for input(s): CHOL, HDL, LDLCALC, TRIG, CHOLHDL, LDLDIRECT in the last 72 hours. Thyroid   Function Tests: No results for input(s): TSH, T4TOTAL, T3FREE, THYROIDAB in the last 72 hours.  Invalid input(s): FREET3 Anemia Panel: No results for input(s): VITAMINB12, FOLATE, FERRITIN, TIBC, IRON , RETICCTPCT in the last 72 hours.  NM Pulmonary Perfusion Result Date: 09/20/2024 EXAM: NM Lung Perfusion Scan. CLINICAL HISTORY: Pulmonary embolism (PE) suspected, high prob. SOB, chest pain, coughing x 1 wk. Pt has a Pacemaker. Pt has COPD. History of DVT left leg 8-10 yrs ago. TECHNIQUE: 4.33 mCi MAA was administered intravenously via the right AC at 12:23 pm. Planar images of the lungs were obtained in multiple projections. RADIOPHARMACEUTICAL: 4.33 mCi MAA COMPARISON: X ray 1 16 26  FINDINGS: PERFUSION: No wedge-shaped peripheral perfusion defect within left or right lung to suggest acute pulmonary embolism. Normal perfusion pattern. IMPRESSION: 1. No perfusion defects to indicate pulmonary emboli. Electronically signed by: Norleen Boxer MD 09/20/2024 01:19 PM EST RP Workstation: [id number]   DG Chest 2 View Result Date: 09/20/2024 CLINICAL DATA:  Chest pain. EXAM: CHEST - 2 VIEW COMPARISON:  08/31/2024 FINDINGS: Stable of appearance of the left chest ICD. Both lungs are clear. Heart size is stable and within normal limits. Trachea is midline. No large pleural effusions. Bridging osteophytes in the lower thoracic spine. IVC filter is partially imaged. IMPRESSION: No active cardiopulmonary disease. Electronically Signed   By: Juliene Balder M.D.   On: 09/20/2024 09:48  Echo ejection fraction between 40 to 45% diffuse hypokinetic wall motion  TELEMETRY: Heart rate of 95 nonspecific ST-T wave changes:  ASSESSMENT AND PLAN:  Principal Problem:   Chest pain Active Problems:   RLS (restless legs syndrome)   Chronic HFrEF (heart failure with reduced ejection fraction) (HCC)   COPD (chronic obstructive pulmonary disease) (HCC)   S/P implantation of automatic  cardioverter/defibrillator (AICD)   Diet-controlled type 2 diabetes mellitus (HCC)   Seizure disorder (HCC)   Essential hypertension   Morbid obesity with body mass index (BMI) of 50.0 to 59.9 in adult [facility])   Breast cancer (HCC)   Coronary artery disease involving native coronary artery of native heart   DVT (deep venous thrombosis) (HCC)   OSA on CPAP   Hypothyroidism   HTN (hypertension)   A-fib (HCC)   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)    PLan Chest pain probably noncardiac improved resolved recommend conservative therapy outpatient follow-up with [facility] cardiology Chronic HFrEF mild reduced left ventricular function EF around 40 to 45% on echo today essentially unchanged from previously recommend conservative management COPD by history continue inhalers and supportive care History of AICD in place will continue to have the patient follow-up with device clinic at [facility] Diabetes diet controlled continue to follow diabetic diet with routine aerobic exercise Obesity recommend weight loss exercise portion control Obstructive sleep apnea recommend CPAP weight loss Atrial fibrillation recommend anticoagulation rate control prevent stroke risk Have the patient follow-up with [facility] cardiology on discharge   Cara JONETTA Lovelace, MD 09/21/2024 10:38 AM

## 2024-09-21 NOTE — Progress Notes (Signed)
 PHARMACY - ANTICOAGULATION CONSULT NOTE  Pharmacy Consult for heparin  infusion Indication: chest pain/ACS  Allergies[1]  Patient Measurements: weight 90.7 kg 09/01/24    Vital Signs: Temp: 97.8 F (36.6 C) (01/17 0659) Temp Source: Oral (01/17 0659) BP: 111/72 (01/17 0608) Pulse Rate: 85 (01/17 0608)  Labs: Recent Labs    09/20/24 0042 09/20/24 0920 09/20/24 1118 09/21/24 0702  HGB  --  10.4*  --  9.8*  HCT  --  32.9*  --  32.4*  PLT  --  266  --  234  APTT  --   --  27  --   LABPROT  --   --  13.4  --   INR  --   --  1.0  --   HEPARINUNFRC 0.36  --   --  0.37  CREATININE  --  0.97  --   --     CrCl cannot be calculated (Unknown ideal weight.).   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   a.) Bx 02/22/2018 --> NE tumor cells --> AE1/AE3, chromogranin, synaptophysin (+); Ki-67 proliferation rate <1%; b.) repeat Bx 08/24/2018 --> well differentiard NET (G1)   CHF (congestive heart failure) (HCC)    Chronic, continuous use of opioids    a.) has naloxone  Rx available   COPD (chronic obstructive pulmonary disease) (HCC)    Coronary artery disease    Depression    Diet-controlled type 2 diabetes mellitus (HCC)    Diverticulitis 2010   DVT (deep venous thrombosis) (HCC)    Endometriosis 1990   Generalized epilepsy (HCC)    a.) on lacosamide ; b.) seizure 07-30-22 in setting of hypokalemia (K+ 2.6); c.) last seizure 11/13/2023 during blood draw   GERD (gastroesophageal reflux disease)    GIB (gastrointestinal bleeding) 01/08/2018   H/O syncope    HFrEF  (heart failure with reduced ejection fraction) (HCC)    a.)TTE 12/09/16: EF 25-30%, dif HK; b.) TTE 02/08/17: EF 25%, dif HK, mild TR, G1DD; c.) TTE 04/25/17: EF 30-35%, mild LVH; d.) TTE 01/30/18: EF 45%, MAC, LAE, mild MR, G1DD; e.) TTE 11/06/2018: EF 25%, sev glob HK, triv MR/TR/PR; f.) TTE 05/12/19: EF 35%, MAC, AoV sclerosis, G1DD; g.) TTE 11/05/19: EF 20%, sev glob HK, triv MR; h.) TTE 06/17/20: EF 40%, mod glob HK, triv MR; I:) TTE 06/14/22: EF 25-30%, G1DD   History of cardiac catheterization    a.) LHC 10/21/2014: normal coronaries; b.) R/LHC 02/24/2017: normal coronaries, mRA 12, mPA 28, mPCWP 15, CO 8.0, CI 3.8   History of kidney stones    Hx MRSA infection    Hypertension    Hypokalemia    Hypothyroidism    a.) s/p thyroidectomy   Incomplete left bundle branch block (LBBB)    Iron  deficiency anemia 03/27/2018   Lump or mass in breast 11/27/2012   a.) Bx 11/27/2012 --> apocrine wall cyst   Migraine    Nausea & vomiting    Neuropathy    Non-ischemic cardiomyopathy (HCC)    a.) TTE 12/09/2016: EF 25-30%, b.) TTE 02/08/2017:  EF 25%; c.) [employer] AICD placed 03/29/2017; d.) TTE 04/25/2017: EF 30-35%; e.) TTE 01/30/2018: EF 45%; f.) TTE 11/06/2018: EF 25%; g.) TTE 05/12/2019: EF 35%; h.) TTE 11/05/2019: EF 20%; I.) TTE 06/17/2020: EF 40%; J.) TTE 06/14/2022: EF 25-30%   Obesity    OSA on CPAP    PAF (paroxysmal atrial fibrillation) (HCC)    Pneumonia    PONV (postoperative nausea and vomiting)    Post-COVID chronic cough    Postcholecystectomy diarrhea    Prolonged Q-T interval on ECG    RAD (reactive airway disease)    Restless leg    a.) on ropinirole    Sepsis (HCC)    Unstable angina (HCC)     Medications:  Scheduled:   gabapentin   300 mg Oral TID   levothyroxine   200 mcg Oral QAC breakfast   losartan   12.5 mg Oral BID   metoprolol  succinate  50 mg Oral BID   potassium chloride  SA  20 mEq Oral BID   rOPINIRole   4 mg Oral QHS   spironolactone   50 mg Oral BID   Infusions:    heparin  1,200 Units/hr (09/20/24 1422)   PRN: acetaminophen  **OR** acetaminophen , guaiFENesin , metoCLOPramide  (REGLAN ) injection, morphine  injection, nitroGLYCERIN , oxyCODONE , polyethylene glycol  Assessment: 59 yo female w/ PMH of anxiety, CHF, COPD, depression, HTN, hypothyroidism, RLS, seizure d/o presenting to ED reporting of chest pain. A review of medical records reveals no chronic anticoagulation prior to admission CBC stable at baseline.   1/17 0702 HL 0.37.   Goal of Therapy:  anti-Xa level  0.3-0.7 units/ml Monitor platelets by anticoagulation protocol: Yes   Plan:  Heparin  level is therapeutic. Will continue heparin  infusion at 1200 units/hr. Recheck heparin  level and CBC with AM labs.   Cathaleen GORMAN Blanch, PharmD, BCPS 09/21/2024,7:39 AM       [1]  Allergies Allergen Reactions   Contrast Media [Iodinated Contrast Media] Shortness Of Breath    Per CT report in 2014 pt had break through contrast reaction with hives and SOB following 13 hour prep. MSY    Ferumoxytol  Nausea Only   Iron  Sucrose Anaphylaxis   Lidocaine  Hives   Metrizamide Shortness Of Breath   Penicillins Hives and Other (See Comments)    Tolerated ceftriaxone  in past IgE = 4 (WNL) on 08/12/2022  ediate rash, facial/tongue/throat swelling, SOB or lightheadedness with hypotension, tachy   Sacubitril-Valsartan     Sz (unclear if new start entresto vs hypoglycemia)   Latex Hives    IgE < 0.10 (WNL) on 08/21/2023   Tizanidine  Other (See Comments)    Feels altered   Zofran  [Ondansetron ] Other (See Comments)    Migraine per pt   Povidone-Iodine  Rash    Blistering rash   Pulmicort  [Budesonide ] Itching

## 2024-09-21 NOTE — Discharge Summary (Signed)
 Meshell Abdulaziz Boer FMW:[id number] DOB: July 29, 1966 DOA: 09/20/2024  PCP: Valora Lynwood FALCON, MD  Admit date: 09/20/2024 Discharge date: 09/21/2024  Time spent: 35 minutes  Recommendations for Outpatient Follow-up:  Pcp f/u 1 week [facility] cardiology f/u     Discharge Diagnoses:  Principal Problem:   Chest pain Active Problems:   S/P implantation of automatic cardioverter/defibrillator (AICD)   Seizure disorder (HCC)   OSA on CPAP   HTN (hypertension)   RLS (restless legs syndrome)   Chronic HFrEF (heart failure with reduced ejection fraction) (HCC)   COPD (chronic obstructive pulmonary disease) (HCC)   Hypothyroidism   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  History of present illness:  From admission h and p Ommie Vernecia Umble is a pleasant 59 y.o. female with medical history significant for obesity, CHF, s/p AICD, CAD s/p of MI, COPD not on home oxygen , A-fib, gastric carcinoid tumor DM, seizure disorder, restless leg syndrome who came into ED complaining of shortness of breath and chest discomfort.  Patient stated that she had central chest which is pressure-like radiating to the back associated with some shortness of breath.  She has been having those problems for almost a week progressively worsening, associated with some nonproductive cough.  Patient stated that she had pain during the deep breath.  She follows with oncologist for carcinoid tumor.  She had some decreased appetite but no nausea no vomiting.  She denies any fever, chills, palpitations, abdominal  pain, diarrhea, leg swelling.   Hospital Course:   Patient presents with two days brief (lasting about a minute) episodic (few times a day) unprovoked instances of chest discomfort. Troponins found to be mildly elevated (132>174). Started on heparin  and admitted. Cardiology consult. Chest pain is resolved, last episode was yesterday. Feels at her baseline. No significant lab abnormalities. EKG non-ischemic. Perfusion scan neg for PE, cxr without acute abnormality. TTE with no significant changes from prior though EF slightly lower (40-45 from 45-50 lat check), no RWMAs. Etiology of troponin elevation thought to be demand. Cleared for discharge by cardiology, no changes to home meds advised, does advise f/u with outpatient cardiologist.   Procedures: none   Consultations: cardiology  Discharge Exam: Vitals:   09/21/24 1056 09/21/24 1100  BP:  116/70  Pulse:  86  Resp:  12  Temp: 98.4 F (36.9 C)   SpO2:  92%    General: NAD Cardiovascular: RRR Respiratory: CTAB  Discharge Instructions   Discharge Instructions     Diet - low sodium heart healthy   Complete by: As directed    Increase activity slowly   Complete by: As directed       Allergies as of 09/21/2024       Reactions   Contrast Media [iodinated Contrast Media] Shortness Of Breath   Per CT report in 2014 pt had break through contrast reaction with hives and SOB following 13 hour prep. MSY    Ferumoxytol  Nausea Only   Iron  Sucrose Anaphylaxis   Lidocaine  Hives   Metrizamide Shortness Of Breath   Penicillins Hives,  Other (See Comments)   Tolerated ceftriaxone  in past IgE = 4 (WNL) on 08/12/2022 ediate rash, facial/tongue/throat swelling, SOB or lightheadedness with hypotension, tachy   Sacubitril-valsartan    Sz (unclear if new start entresto vs hypoglycemia)   Latex Hives   IgE < 0.10 (WNL) on 08/21/2023   Tizanidine  Other (See Comments)   Feels altered   Zofran  [ondansetron ] Other (See Comments)   Migraine  per pt   Povidone-iodine  Rash   Blistering rash   Pulmicort  [budesonide ] Itching        Medication List     TAKE these medications    rOPINIRole  4 MG tablet Commonly known as: REQUIP  Take 4 mg by mouth at bedtime. The timing of this medication is very important.   albuterol  108 (90 Base) MCG/ACT inhaler Commonly known as: VENTOLIN  HFA Inhale 2 puffs into the lungs every 6 (six) hours as needed for wheezing or shortness of breath.   albuterol  (2.5 MG/3ML) 0.083% nebulizer solution Commonly known as: PROVENTIL  Inhale 2.5 mg into the lungs every 6 (six) hours as needed.   benzonatate  200 MG capsule Commonly known as: TESSALON  Take 1 capsule (200 mg total) by mouth 3 (three) times daily.   cyanocobalamin  1000 MCG/ML injection Commonly known as: VITAMIN B12 Inject 1,000 mcg into the muscle every 30 (thirty) days.   escitalopram  10 MG tablet Commonly known as: LEXAPRO  Take 10 mg by mouth every morning.   feeding supplement (GLUCERNA SHAKE) Liqd Take 237 mLs by mouth 3 (three) times daily between meals.   gabapentin  300 MG capsule Commonly known as: NEURONTIN  Take 1 capsule (300 mg total) by mouth 3 (three) times daily.   guaiFENesin  600 MG 12 hr tablet Commonly known as: MUCINEX  Take 1 tablet (600 mg total) by mouth 2 (two) times daily.   levothyroxine  200 MCG tablet Commonly known as: SYNTHROID  Take 200 mcg by mouth daily before breakfast. Take 200 mcg by mouth once daily. Take in addition to one 25 mcg tablet for total 225 mcg daily.   levothyroxine  25 MCG tablet Commonly known as: SYNTHROID  Take 25 mcg by mouth every morning. Take 25 mcg by mouth once daily. Take in addition to one 200 mcg tablet for total 225 mcg daily.   LORazepam  1 MG tablet Commonly known as: ATIVAN  Take 1 tablet (1 mg total) by mouth 4 (four) times daily as needed. What changed: when to take this   losartan  25 MG tablet Commonly known as: COZAAR  Take 12.5 mg by mouth 2 (two) times  daily.   methadone  10 MG tablet Commonly known as: DOLOPHINE  Take 10-20 mg by mouth 3 (three) times daily. 10 MG BID and 20 mg HS   metoprolol  succinate 50 MG 24 hr tablet Commonly known as: TOPROL -XL Take 50 mg by mouth 2 (two) times daily.   Multivitamin Tabs Take 1 tablet by mouth daily.   oxyCODONE -acetaminophen  10-325 MG tablet Commonly known as: PERCOCET Take 1 tablet by mouth every 4 (four) hours as needed.   Potassium Chloride  ER 20 MEQ Tbcr Take 20 mEq by mouth 2 (two) times daily. When taking torsemide    promethazine  25 MG tablet Commonly known as: PHENERGAN  Take 25 mg by mouth 3 (three) times daily as needed for nausea or vomiting.   spironolactone  50 MG tablet Commonly known as: ALDACTONE  Take 50 mg by mouth 2 (two) times daily.   tiZANidine  4 MG tablet Commonly known as: ZANAFLEX  Take 4 mg by mouth every 8 (eight) hours as needed.   torsemide   20 MG tablet Commonly known as: DEMADEX  Take 40 mg by mouth 2 (two) times daily.   Vitamin D  (Ergocalciferol ) 1.25 MG (50000 UNIT) Caps capsule Commonly known as: DRISDOL Take 50,000 Units by mouth once a week.       Allergies[1]  Follow-up Information     Valora Lynwood FALCON, MD Follow up.   Specialty: Family Medicine Contact information: [address] [place] [address] [phone]         [facility] cardiologist Follow up.                   The results of significant diagnostics from this hospitalization (including imaging, microbiology, ancillary and laboratory) are listed below for reference.    Significant Diagnostic Studies: ECHOCARDIOGRAM COMPLETE Result Date: 09/21/2024    ECHOCARDIOGRAM REPORT   Patient Name:   ROSSLYN PASION Date of Exam: 09/21/2024 Medical Rec #:  [id number]           Height:       65.0 in Accession #:    [id number]          Weight:       201.1 lb Date of Birth:  19-Oct-1965           BSA:          1.983 m Patient Age:    58 years             BP:           111/72 mmHg Patient Gender: F                   HR:           86 bpm. Exam Location:  [facility] Procedure: 2D Echo, Color Doppler and Cardiac Doppler (Both Spectral and Color            Flow Doppler were utilized during procedure). Indications:     R07.9 Chest Pain  History:         Patient has prior history of Echocardiogram examinations. CHF,                  Previous Myocardial Infarction, Defibrillator, Breast CA and                  COPD, Arrythmias:Incomplete LBBB; Risk Factors:Sleep Apnea.  Sonographer:     L. Thornton-Maynard, RDCS Referring Phys:  [id number] [facility] PAUDEL Diagnosing Phys: Cara JONETTA Lovelace MD  Sonographer Comments: Global longitudinal strain was attempted. IMPRESSIONS  1. Left ventricular ejection fraction, by estimation, is 40 to 45%. The left ventricle has mildly decreased function. The left ventricle demonstrates global hypokinesis. The left ventricular internal cavity size was mildly dilated. There is mild concentric left ventricular hypertrophy. Left ventricular diastolic parameters are consistent with Grade I diastolic dysfunction (impaired relaxation). The average left ventricular global longitudinal strain is 9.9 %. The global longitudinal strain is abnormal.  2. Right ventricular systolic function is low normal. The right ventricular size is mildly enlarged. There is normal pulmonary artery systolic pressure.  3. Left atrial size was mildly dilated.  4. Right atrial size was mildly dilated.  5. The mitral valve is normal in structure. Trivial mitral valve regurgitation.  6. The aortic valve is normal in structure. Aortic valve regurgitation is not visualized. FINDINGS  Left Ventricle: Left ventricular ejection fraction, by estimation, is 40 to 45%. The left ventricle has mildly decreased function. The left ventricle demonstrates global hypokinesis. The average left  ventricular global longitudinal strain is 9.9 %. Strain was performed and the global longitudinal strain is  abnormal. The left ventricular internal cavity size was mildly dilated. There is mild concentric left ventricular hypertrophy. Left ventricular diastolic parameters are consistent with Grade I diastolic dysfunction (impaired relaxation). Right Ventricle: The right ventricular size is mildly enlarged. No increase in right ventricular wall thickness. Right ventricular systolic function is low normal. There is normal pulmonary artery systolic pressure. The tricuspid regurgitant velocity is 2.22 m/s, and with an assumed right atrial pressure of 3 mmHg, the estimated right ventricular systolic pressure is 22.7 mmHg. Left Atrium: Left atrial size was mildly dilated. Right Atrium: Right atrial size was mildly dilated. Pericardium: There is no evidence of pericardial effusion. Mitral Valve: The mitral valve is normal in structure. Trivial mitral valve regurgitation. Tricuspid Valve: The tricuspid valve is normal in structure. Tricuspid valve regurgitation is trivial. Aortic Valve: The aortic valve is normal in structure. Aortic valve regurgitation is not visualized. Aortic valve mean gradient measures 4.0 mmHg. Aortic valve peak gradient measures 7.7 mmHg. Aortic valve area, by VTI measures 1.43 cm. Pulmonic Valve: The pulmonic valve was not well visualized. Pulmonic valve regurgitation is not visualized. Aorta: The aortic root was not well visualized. IAS/Shunts: No atrial level shunt detected by color flow Doppler. Additional Comments: 3D was performed not requiring image post processing on an independent workstation and was indeterminate. A device lead is visualized.  LEFT VENTRICLE PLAX 2D LVIDd:         5.10 cm     Diastology LVIDs:         4.00 cm     LV e' medial:    3.70 cm/s LV PW:         1.30 cm     LV E/e' medial:  17.4 LV IVS:        1.00 cm     LV e' lateral:   6.42 cm/s LVOT diam:     1.90 cm     LV E/e' lateral: 10.0 LV SV:         36 LV SV Index:   18          2D Longitudinal Strain LVOT Area:     2.84 cm     2D Strain GLS (A4C):   3.3 % LV IVRT:       106 msec    2D Strain GLS (A3C):   12.7 %                            2D Strain GLS (A2C):   13.7 %                            2D Strain GLS Avg:     9.9 % LV Volumes (MOD) LV vol d, MOD A2C: 54.3 ml LV vol d, MOD A4C: 86.3 ml LV vol s, MOD A2C: 25.6 ml LV vol s, MOD A4C: 50.9 ml LV SV MOD A2C:     28.7 ml LV SV MOD A4C:     86.3 ml LV SV MOD BP:      34.6 ml RIGHT VENTRICLE            IVC RV Basal diam:  3.40 cm    IVC diam: 1.00 cm RV S prime:     9.25 cm/s TAPSE (M-mode): 2.1 cm LEFT ATRIUM  Index        RIGHT ATRIUM           Index LA diam:        3.30 cm 1.66 cm/m   RA Area:     16.50 cm LA Vol (A2C):   39.2 ml 19.77 ml/m  RA Volume:   42.70 ml  21.54 ml/m LA Vol (A4C):   47.6 ml 24.01 ml/m LA Biplane Vol: 44.5 ml 22.45 ml/m  AORTIC VALVE                    PULMONIC VALVE AV Area (Vmax):    1.69 cm     PV Vmax:       1.12 m/s AV Area (Vmean):   1.55 cm     PV Peak grad:  5.0 mmHg AV Area (VTI):     1.43 cm AV Vmax:           139.00 cm/s AV Vmean:          95.600 cm/s AV VTI:            0.254 m AV Peak Grad:      7.7 mmHg AV Mean Grad:      4.0 mmHg LVOT Vmax:         82.80 cm/s LVOT Vmean:        52.400 cm/s LVOT VTI:          0.128 m LVOT/AV VTI ratio: 0.50  AORTA Ao Root diam: 2.60 cm Ao Asc diam:  2.70 cm MV E velocity: 64.50 cm/s   TRICUSPID VALVE MV A velocity: 100.00 cm/s  TR Peak grad:   19.7 mmHg MV E/A ratio:  0.64         TR Vmax:        222.00 cm/s                              SHUNTS                             Systemic VTI:  0.13 m                             Systemic Diam: 1.90 cm Cara JONETTA Lovelace MD Electronically signed by Cara JONETTA Lovelace MD Signature Date/Time: 09/21/2024/10:46:33 AM    Final    NM Pulmonary Perfusion Result Date: 09/20/2024 EXAM: NM Lung Perfusion Scan. CLINICAL HISTORY: Pulmonary embolism (PE) suspected, high prob. SOB, chest pain, coughing x 1 wk. Pt has a Pacemaker. Pt has COPD. History of DVT left leg  8-10 yrs ago. TECHNIQUE: 4.33 mCi MAA was administered intravenously via the right AC at 12:23 pm. Planar images of the lungs were obtained in multiple projections. RADIOPHARMACEUTICAL: 4.33 mCi MAA COMPARISON: X ray 1 16 26  FINDINGS: PERFUSION: No wedge-shaped peripheral perfusion defect within left or right lung to suggest acute pulmonary embolism. Normal perfusion pattern. IMPRESSION: 1. No perfusion defects to indicate pulmonary emboli. Electronically signed by: Norleen Boxer MD 09/20/2024 01:19 PM EST RP Workstation: [id number]   DG Chest 2 View Result Date: 09/20/2024 CLINICAL DATA:  Chest pain. EXAM: CHEST - 2 VIEW COMPARISON:  08/31/2024 FINDINGS: Stable of appearance of the left chest ICD. Both lungs are clear. Heart size is stable and within normal limits. Trachea is midline. No large pleural effusions. Bridging  osteophytes in the lower thoracic spine. IVC filter is partially imaged. IMPRESSION: No active cardiopulmonary disease. Electronically Signed   By: Juliene Balder M.D.   On: 09/20/2024 09:48   DG Chest Port 1 View Result Date: 08/31/2024 CLINICAL DATA:  Shortness of breath. EXAM: PORTABLE CHEST 1 VIEW COMPARISON:  08/02/2024 FINDINGS: The cardio pericardial silhouette is enlarged. The lungs are clear without focal pneumonia, edema, pneumothorax or pleural effusion. Left-sided permanent pacemaker/AICD again noted. Telemetry leads overlie the chest. IMPRESSION: Enlargement of the cardiopericardial silhouette. No acute cardiopulmonary findings. Electronically Signed   By: Camellia Candle M.D.   On: 08/31/2024 08:37    Microbiology: Recent Results (from the past 240 hours)  Resp panel by RT-PCR (RSV, Flu A&B, Covid) Anterior Nasal Swab     Status: None   Collection Time: 09/20/24 11:56 AM   Specimen: Anterior Nasal Swab  Result Value Ref Range Status   SARS Coronavirus 2 by RT PCR NEGATIVE NEGATIVE Final    Comment: (NOTE) SARS-CoV-2 target nucleic acids are NOT DETECTED.  The  SARS-CoV-2 RNA is generally detectable in upper respiratory specimens during the acute phase of infection. The lowest concentration of SARS-CoV-2 viral copies this assay can detect is 138 copies/mL. A negative result does not preclude SARS-Cov-2 infection and should not be used as the sole basis for treatment or other patient management decisions. A negative result may occur with  improper specimen collection/handling, submission of specimen other than nasopharyngeal swab, presence of viral mutation(s) within the areas targeted by this assay, and inadequate number of viral copies(<138 copies/mL). A negative result must be combined with clinical observations, patient history, and epidemiological information. The expected result is Negative.  Fact Sheet for Patients:  bloggercourse.com  Fact Sheet for Healthcare Providers:  seriousbroker.it  This test is no t yet approved or cleared by the [place]  FDA and  has been authorized for detection and/or diagnosis of SARS-CoV-2 by FDA under an Emergency Use Authorization (EUA). This EUA will remain  in effect (meaning this test can be used) for the duration of the COVID-19 declaration under Section 564(b)(1) of the Act, 21 U.S.C.section 360bbb-3(b)(1), unless the authorization is terminated  or revoked sooner.       Influenza A by PCR NEGATIVE NEGATIVE Final   Influenza B by PCR NEGATIVE NEGATIVE Final    Comment: (NOTE) The Xpert Xpress SARS-CoV-2/FLU/RSV plus assay is intended as an aid in the diagnosis of influenza from Nasopharyngeal swab specimens and should not be used as a sole basis for treatment. Nasal washings and aspirates are unacceptable for Xpert Xpress SARS-CoV-2/FLU/RSV testing.  Fact Sheet for Patients: bloggercourse.com  Fact Sheet for Healthcare Providers: seriousbroker.it  This test is not yet approved or  cleared by the [place]  FDA and has been authorized for detection and/or diagnosis of SARS-CoV-2 by FDA under an Emergency Use Authorization (EUA). This EUA will remain in effect (meaning this test can be used) for the duration of the COVID-19 declaration under Section 564(b)(1) of the Act, 21 U.S.C. section 360bbb-3(b)(1), unless the authorization is terminated or revoked.     Resp Syncytial Virus by PCR NEGATIVE NEGATIVE Final    Comment: (NOTE) Fact Sheet for Patients: bloggercourse.com  Fact Sheet for Healthcare Providers: seriousbroker.it  This test is not yet approved or cleared by the [place]  FDA and has been authorized for detection and/or diagnosis of SARS-CoV-2 by FDA under an Emergency Use Authorization (EUA). This EUA will remain in effect (meaning this test can be used) for  the duration of the COVID-19 declaration under Section 564(b)(1) of the Act, 21 U.S.C. section 360bbb-3(b)(1), unless the authorization is terminated or revoked.  Performed at [facility], [address] [place], [place] [address]      Labs: Basic Metabolic Panel: Recent Labs  Lab 09/20/24 0920 09/21/24 0702  NA 140 140  K 3.1* 3.4*  CL 95* 98  CO2 34* 32  GLUCOSE 166* 161*  BUN 11 13  CREATININE 0.97 0.98  CALCIUM  9.3 9.1  MG 1.9  --    Liver Function Tests: No results for input(s): AST, ALT, ALKPHOS, BILITOT, PROT, ALBUMIN  in the last 168 hours. No results for input(s): LIPASE, AMYLASE in the last 168 hours. No results for input(s): AMMONIA in the last 168 hours. CBC: Recent Labs  Lab 09/20/24 0920 09/21/24 0702  WBC 11.6* 7.0  HGB 10.4* 9.8*  HCT 32.9* 32.4*  MCV 79.1* 81.0  PLT 266 234   Cardiac Enzymes: No results for input(s): CKTOTAL, CKMB, CKMBINDEX, TROPONINI in the last 168 hours. BNP: BNP (last 3 results) Recent Labs    03/03/24 1604 03/14/24 0906  03/28/24 0849  BNP 34.2 34.2 18.2    ProBNP (last 3 results) Recent Labs    08/02/24 1430 08/31/24 0745  PROBNP 168.0 106.0    CBG: No results for input(s): GLUCAP in the last 168 hours.     Signed:  Devaughn KATHEE Ban MD.  [facility] 09/21/2024, 1:07 PM     [1]  Allergies Allergen Reactions   Contrast Media [Iodinated Contrast Media] Shortness Of Breath    Per CT report in 2014 pt had break through contrast reaction with hives and SOB following 13 hour prep. MSY    Ferumoxytol  Nausea Only   Iron  Sucrose Anaphylaxis   Lidocaine  Hives   Metrizamide Shortness Of Breath   Penicillins Hives and Other (See Comments)    Tolerated ceftriaxone  in past IgE = 4 (WNL) on 08/12/2022  ediate rash, facial/tongue/throat swelling, SOB or lightheadedness with hypotension, tachy   Sacubitril-Valsartan     Sz (unclear if new start entresto vs hypoglycemia)   Latex Hives    IgE < 0.10 (WNL) on 08/21/2023   Tizanidine  Other (See Comments)    Feels altered   Zofran  [Ondansetron ] Other (See Comments)    Migraine per pt   Povidone-Iodine  Rash    Blistering rash   Pulmicort  [Budesonide ] Itching
